# Patient Record
Sex: Female | Born: 1945
Health system: Southern US, Community
[De-identification: ages and names within clinical notes are randomized; demographics above are authoritative.]

## PROBLEM LIST (undated history)

## (undated) DIAGNOSIS — Z973 Presence of spectacles and contact lenses: Secondary | ICD-10-CM

## (undated) DIAGNOSIS — Z8619 Personal history of other infectious and parasitic diseases: Secondary | ICD-10-CM

## (undated) DIAGNOSIS — Z8541 Personal history of malignant neoplasm of cervix uteri: Secondary | ICD-10-CM

## (undated) DIAGNOSIS — Z972 Presence of dental prosthetic device (complete) (partial): Secondary | ICD-10-CM

## (undated) DIAGNOSIS — Z794 Long term (current) use of insulin: Secondary | ICD-10-CM

## (undated) DIAGNOSIS — E785 Hyperlipidemia, unspecified: Secondary | ICD-10-CM

## (undated) DIAGNOSIS — B370 Candidal stomatitis: Secondary | ICD-10-CM

## (undated) DIAGNOSIS — M1990 Unspecified osteoarthritis, unspecified site: Secondary | ICD-10-CM

## (undated) DIAGNOSIS — I739 Peripheral vascular disease, unspecified: Secondary | ICD-10-CM

## (undated) DIAGNOSIS — R3915 Urgency of urination: Secondary | ICD-10-CM

## (undated) DIAGNOSIS — R600 Localized edema: Secondary | ICD-10-CM

## (undated) DIAGNOSIS — I6381 Other cerebral infarction due to occlusion or stenosis of small artery: Secondary | ICD-10-CM

## (undated) DIAGNOSIS — R109 Unspecified abdominal pain: Secondary | ICD-10-CM

## (undated) DIAGNOSIS — J189 Pneumonia, unspecified organism: Secondary | ICD-10-CM

## (undated) DIAGNOSIS — K08109 Complete loss of teeth, unspecified cause, unspecified class: Secondary | ICD-10-CM

## (undated) DIAGNOSIS — C801 Malignant (primary) neoplasm, unspecified: Secondary | ICD-10-CM

## (undated) DIAGNOSIS — Z87442 Personal history of urinary calculi: Secondary | ICD-10-CM

## (undated) DIAGNOSIS — I6529 Occlusion and stenosis of unspecified carotid artery: Secondary | ICD-10-CM

## (undated) DIAGNOSIS — Z8709 Personal history of other diseases of the respiratory system: Secondary | ICD-10-CM

## (undated) DIAGNOSIS — E890 Postprocedural hypothyroidism: Secondary | ICD-10-CM

## (undated) DIAGNOSIS — R110 Nausea: Secondary | ICD-10-CM

## (undated) DIAGNOSIS — Z8639 Personal history of other endocrine, nutritional and metabolic disease: Secondary | ICD-10-CM

## (undated) DIAGNOSIS — E119 Type 2 diabetes mellitus without complications: Secondary | ICD-10-CM

## (undated) DIAGNOSIS — C569 Malignant neoplasm of unspecified ovary: Secondary | ICD-10-CM

## (undated) DIAGNOSIS — I639 Cerebral infarction, unspecified: Secondary | ICD-10-CM

## (undated) HISTORY — PX: COLONOSCOPY: SHX174

## (undated) HISTORY — PX: TOTAL HIP ARTHROPLASTY: SHX124

## (undated) HISTORY — PX: REVISION TOTAL HIP ARTHROPLASTY: SHX766

## (undated) HISTORY — DX: Peripheral vascular disease, unspecified: I73.9

## (undated) HISTORY — DX: Pneumonia, unspecified organism: J18.9

## (undated) HISTORY — PX: UPPER GASTROINTESTINAL ENDOSCOPY: SHX188

## (undated) HISTORY — PX: CATARACT EXTRACTION W/ INTRAOCULAR LENS  IMPLANT, BILATERAL: SHX1307

## (undated) HISTORY — DX: Occlusion and stenosis of unspecified carotid artery: I65.29

## (undated) HISTORY — PX: OTHER SURGICAL HISTORY: SHX169

## (undated) HISTORY — DX: Malignant neoplasm of unspecified ovary: C56.9

## (undated) HISTORY — PX: APPENDECTOMY: SHX54

## (undated) HISTORY — PX: TANDEM RING INSERTION: SHX6199

---
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
## 1998-03-01 ENCOUNTER — Ambulatory Visit (HOSPITAL_COMMUNITY): Admission: RE | Admit: 1998-03-01 | Discharge: 1998-03-01 | Payer: Self-pay | Admitting: Specialist

## 1999-06-24 ENCOUNTER — Other Ambulatory Visit: Admission: RE | Admit: 1999-06-24 | Discharge: 1999-06-24 | Payer: Self-pay | Admitting: *Deleted

## 2000-08-11 ENCOUNTER — Encounter: Payer: Self-pay | Admitting: Internal Medicine

## 2000-08-11 ENCOUNTER — Encounter: Admission: RE | Admit: 2000-08-11 | Discharge: 2000-08-11 | Payer: Self-pay | Admitting: Internal Medicine

## 2000-08-13 ENCOUNTER — Other Ambulatory Visit: Admission: RE | Admit: 2000-08-13 | Discharge: 2000-08-13 | Payer: Self-pay | Admitting: *Deleted

## 2000-08-20 ENCOUNTER — Encounter: Payer: Self-pay | Admitting: Internal Medicine

## 2000-08-20 ENCOUNTER — Encounter: Admission: RE | Admit: 2000-08-20 | Discharge: 2000-08-20 | Payer: Self-pay | Admitting: Internal Medicine

## 2000-11-12 ENCOUNTER — Encounter: Payer: Self-pay | Admitting: Internal Medicine

## 2000-11-12 ENCOUNTER — Encounter: Admission: RE | Admit: 2000-11-12 | Discharge: 2000-11-12 | Payer: Self-pay | Admitting: Internal Medicine

## 2001-12-07 ENCOUNTER — Other Ambulatory Visit: Admission: RE | Admit: 2001-12-07 | Discharge: 2001-12-07 | Payer: Self-pay | Admitting: *Deleted

## 2003-09-25 ENCOUNTER — Other Ambulatory Visit: Admission: RE | Admit: 2003-09-25 | Discharge: 2003-09-25 | Payer: Self-pay | Admitting: *Deleted

## 2004-04-22 ENCOUNTER — Emergency Department (HOSPITAL_COMMUNITY): Admission: EM | Admit: 2004-04-22 | Discharge: 2004-04-22 | Payer: Self-pay | Admitting: *Deleted

## 2004-09-18 ENCOUNTER — Other Ambulatory Visit: Admission: RE | Admit: 2004-09-18 | Discharge: 2004-09-18 | Payer: Self-pay | Admitting: *Deleted

## 2005-05-30 ENCOUNTER — Encounter: Payer: Self-pay | Admitting: Pulmonary Disease

## 2006-01-07 ENCOUNTER — Other Ambulatory Visit: Admission: RE | Admit: 2006-01-07 | Discharge: 2006-01-07 | Payer: Self-pay | Admitting: *Deleted

## 2007-02-15 ENCOUNTER — Encounter: Admission: RE | Admit: 2007-02-15 | Discharge: 2007-02-15 | Payer: Self-pay | Admitting: Unknown Physician Specialty

## 2007-03-19 ENCOUNTER — Encounter: Payer: Self-pay | Admitting: Pulmonary Disease

## 2007-09-20 ENCOUNTER — Ambulatory Visit (HOSPITAL_BASED_OUTPATIENT_CLINIC_OR_DEPARTMENT_OTHER): Admission: RE | Admit: 2007-09-20 | Discharge: 2007-09-20 | Payer: Self-pay | Admitting: Physician Assistant

## 2007-09-20 ENCOUNTER — Encounter (INDEPENDENT_AMBULATORY_CARE_PROVIDER_SITE_OTHER): Payer: Self-pay | Admitting: General Surgery

## 2008-04-26 ENCOUNTER — Encounter: Admission: RE | Admit: 2008-04-26 | Discharge: 2008-04-26 | Payer: Self-pay | Admitting: Gynecology

## 2008-05-03 ENCOUNTER — Encounter: Admission: RE | Admit: 2008-05-03 | Discharge: 2008-05-03 | Payer: Self-pay | Admitting: Gynecology

## 2008-05-09 ENCOUNTER — Other Ambulatory Visit: Admission: RE | Admit: 2008-05-09 | Discharge: 2008-05-09 | Payer: Self-pay | Admitting: Gynecology

## 2008-10-20 ENCOUNTER — Ambulatory Visit: Payer: Self-pay | Admitting: Pulmonary Disease

## 2008-10-20 DIAGNOSIS — E1169 Type 2 diabetes mellitus with other specified complication: Secondary | ICD-10-CM | POA: Insufficient documentation

## 2008-10-20 DIAGNOSIS — J438 Other emphysema: Secondary | ICD-10-CM | POA: Insufficient documentation

## 2008-10-20 DIAGNOSIS — J4489 Other specified chronic obstructive pulmonary disease: Secondary | ICD-10-CM | POA: Insufficient documentation

## 2008-10-20 DIAGNOSIS — J449 Chronic obstructive pulmonary disease, unspecified: Secondary | ICD-10-CM | POA: Insufficient documentation

## 2008-10-20 DIAGNOSIS — K5090 Crohn's disease, unspecified, without complications: Secondary | ICD-10-CM | POA: Insufficient documentation

## 2008-10-20 DIAGNOSIS — E785 Hyperlipidemia, unspecified: Secondary | ICD-10-CM

## 2008-10-20 DIAGNOSIS — E119 Type 2 diabetes mellitus without complications: Secondary | ICD-10-CM | POA: Insufficient documentation

## 2008-10-20 DIAGNOSIS — J209 Acute bronchitis, unspecified: Secondary | ICD-10-CM | POA: Insufficient documentation

## 2008-10-20 DIAGNOSIS — Z87891 Personal history of nicotine dependence: Secondary | ICD-10-CM | POA: Insufficient documentation

## 2008-10-27 ENCOUNTER — Telehealth (INDEPENDENT_AMBULATORY_CARE_PROVIDER_SITE_OTHER): Payer: Self-pay | Admitting: *Deleted

## 2008-11-03 ENCOUNTER — Ambulatory Visit: Payer: Self-pay | Admitting: Pulmonary Disease

## 2008-11-29 ENCOUNTER — Ambulatory Visit: Payer: Self-pay | Admitting: Pulmonary Disease

## 2008-11-29 DIAGNOSIS — B370 Candidal stomatitis: Secondary | ICD-10-CM | POA: Insufficient documentation

## 2009-04-27 ENCOUNTER — Encounter: Admission: RE | Admit: 2009-04-27 | Discharge: 2009-04-27 | Payer: Self-pay | Admitting: Emergency Medicine

## 2010-01-27 ENCOUNTER — Encounter: Payer: Self-pay | Admitting: Endocrinology

## 2010-02-02 ENCOUNTER — Encounter: Payer: Self-pay | Admitting: Endocrinology

## 2010-02-07 ENCOUNTER — Ambulatory Visit (HOSPITAL_COMMUNITY): Admission: RE | Admit: 2010-02-07 | Discharge: 2010-02-07 | Payer: Self-pay | Admitting: Emergency Medicine

## 2010-02-12 ENCOUNTER — Ambulatory Visit: Payer: Self-pay | Admitting: Endocrinology

## 2010-02-12 LAB — CONVERTED CEMR LAB
Free T4: 1.5 ng/dL (ref 0.6–1.6)
TSH: 0.02 microintl units/mL — ABNORMAL LOW (ref 0.35–5.50)

## 2010-02-21 ENCOUNTER — Ambulatory Visit (HOSPITAL_COMMUNITY): Admission: RE | Admit: 2010-02-21 | Discharge: 2010-02-21 | Payer: Self-pay | Admitting: Endocrinology

## 2010-04-11 ENCOUNTER — Ambulatory Visit: Payer: Self-pay | Admitting: Endocrinology

## 2010-04-11 LAB — CONVERTED CEMR LAB
Free T4: 0.7 ng/dL (ref 0.6–1.6)
TSH: 0.06 microintl units/mL — ABNORMAL LOW (ref 0.35–5.50)

## 2010-05-21 ENCOUNTER — Telehealth: Payer: Self-pay | Admitting: Endocrinology

## 2010-06-06 ENCOUNTER — Ambulatory Visit: Payer: Self-pay | Admitting: Endocrinology

## 2010-06-06 DIAGNOSIS — E039 Hypothyroidism, unspecified: Secondary | ICD-10-CM | POA: Insufficient documentation

## 2010-06-06 DIAGNOSIS — E890 Postprocedural hypothyroidism: Secondary | ICD-10-CM

## 2010-06-06 LAB — CONVERTED CEMR LAB
Free T4: 0.11 ng/dL — ABNORMAL LOW (ref 0.60–1.60)
TSH: 46.83 microintl units/mL — ABNORMAL HIGH (ref 0.35–5.50)

## 2010-07-05 ENCOUNTER — Telehealth: Payer: Self-pay | Admitting: Internal Medicine

## 2010-07-06 ENCOUNTER — Emergency Department (HOSPITAL_COMMUNITY): Admission: EM | Admit: 2010-07-06 | Discharge: 2010-07-06 | Payer: Self-pay | Admitting: Emergency Medicine

## 2010-07-18 ENCOUNTER — Ambulatory Visit: Payer: Self-pay | Admitting: Endocrinology

## 2010-07-18 LAB — CONVERTED CEMR LAB: TSH: 7.09 microintl units/mL — ABNORMAL HIGH (ref 0.35–5.50)

## 2010-09-19 ENCOUNTER — Ambulatory Visit: Payer: Self-pay | Admitting: Endocrinology

## 2010-09-19 DIAGNOSIS — R252 Cramp and spasm: Secondary | ICD-10-CM | POA: Insufficient documentation

## 2010-09-19 DIAGNOSIS — R209 Unspecified disturbances of skin sensation: Secondary | ICD-10-CM | POA: Insufficient documentation

## 2010-09-19 LAB — CONVERTED CEMR LAB
BUN: 16 mg/dL (ref 6–23)
CO2: 26 meq/L (ref 19–32)
Calcium: 9.6 mg/dL (ref 8.4–10.5)
Chloride: 106 meq/L (ref 96–112)
Creatinine, Ser: 0.6 mg/dL (ref 0.4–1.2)
GFR calc non Af Amer: 99.11 mL/min (ref >60)
Glucose, Bld: 128 mg/dL — ABNORMAL HIGH (ref 70–99)
Potassium: 4.4 meq/L (ref 3.5–5.1)
Sodium: 141 meq/L (ref 135–145)
TSH: 2.53 microintl units/mL (ref 0.35–5.50)
Vitamin B-12: 332 pg/mL (ref 211–911)

## 2010-09-20 LAB — CONVERTED CEMR LAB
Calcium, Total (PTH): 9.6 mg/dL (ref 8.4–10.5)
PTH: 22 pg/mL (ref 14.0–72.0)

## 2010-12-19 NOTE — Assessment & Plan Note (Signed)
Summary: 1 month/apc   Visit Type:  Follow-up Referred by:  Casper Harrison PCP:  Margaretmary Bayley PA  Chief Complaint:  fu visit.....[name] Kitchenmouth breaking out....stopped Advair....reviewed meds.  History of Present Illness: 65 year old smoker, with known history of diabetes,  Crohn's disease and a history of ovarian  cancer 12/09 episode of asthmatic bronchitis she has 2-3 such episodes every year. She had an episode in 2/09 and then again on 11 1309.   chest x-ray did not show any infiltrates or effusions.  She describes cough incontinence and had an episode in the office.  she was diagnosed with diabetes and placed on metformin once a day--tx prednisone taper and antibiotic.   1/10 Smoking has cut down to 2-4 cigs/day. Breathing much better. Advair caused thrush, now resolved with nystatin. Antibiotics cause vaginal discharge.     Updated Prior Medication List: SKELAXIN 800 MG TABS (METAXALONE) three times a day METFORMIN HCL 500 MG TABS (METFORMIN HCL) once daily METHOCARBAMOL 500 MG TABS (METHOCARBAMOL) once daily TRICOR 145 MG TABS (FENOFIBRATE) once daily ALPRAZOLAM 1 MG TABS (ALPRAZOLAM) once daily ADVAIR DISKUS 250-50 MCG/DOSE MISC (FLUTICASONE-SALMETEROL) 1 puff two times a day , rinse/gargle well after use.  Current Allergies: ! CODEINE  Past Medical History:    Reviewed history from 10/20/2008 and no changes required:       Crohn's Disease       C O P D       Emphysema       Hyperlipidemia       ovarian CA s/p RT at [facility]       Diabetes, Type 2   Social History:    Reviewed history from 10/20/2008 and no changes required:       married and lives with husband       has children       housewife   Risk Factors: Tobacco use:  current    Year started:  15 yrs    Cigarettes:  Yes -- 5 cigs pack(s) per day Alcohol use:  no   Review of Systems       The patient complains of dyspnea on exertion.  The patient denies anorexia, fever, weight loss, weight gain,  vision loss, decreased hearing, hoarseness, chest pain, syncope, peripheral edema, prolonged cough, headaches, hemoptysis, abdominal pain, melena, hematochezia, severe indigestion/heartburn, hematuria, incontinence, genital sores, muscle weakness, suspicious skin lesions, transient blindness, difficulty walking, depression, unusual weight change, abnormal bleeding, enlarged lymph nodes, angioedema, breast masses, and testicular masses.     Vital Signs:  Patient Profile:   65 Years Old Female Height:     60 inches Weight:      181.13 pounds BMI:     35.50 O2 Sat:      95 % O2 treatment:    Room Air Temp:     98.2 degrees F oral Pulse rate:   84 / minute BP sitting:   120 / 78  (left arm) Cuff size:   regular  Pt. in pain?   no  Vitals Entered By: Marinus Maw (November 29, 2008 2:16 PM)              Comments Medications reviewed with patient Marinus Maw  November 29, 2008 2:16 PM      Physical Exam  General:     well developed, well nourished, in no acute distress Head:     normocephalic and atraumatic Eyes:     PERRLA/EOM intact; conjunctiva and sclera clear Ears:     TMs  intact and clear with normal canals Nose:     no deformity, discharge, inflammation, or lesions Mouth:     no deformity or lesions Neck:     no masses, thyromegaly, or abnormal cervical nodes Chest Wall:     no deformities noted Lungs:     clear bilaterally to auscultation and percussion Heart:     regular rate and rhythm, S1, S2 without murmurs, rubs, gallops, or clicks Abdomen:     bowel sounds positive; abdomen soft and non-tender without masses, or organomegaly Msk:     no deformity or scoliosis noted with normal posture Pulses:     pulses normal Extremities:     no clubbing, cyanosis, edema, or deformity noted Neurologic:     CN II-XII grossly intact with normal reflexes, coordination, muscle strength and tone Skin:     intact without lesions or rashes Cervical  Nodes:     no significant adenopathy Axillary Nodes:     no significant adenopathy Psych:     alert and cooperative; normal mood and affect; normal attention span and concentration      Impression & Recommendations:  Problem # 1:  C O P D (ICD-496) Recent exacerbation -r esolved Estimate PFTs in future & consider spiriva Her updated medication list for this problem includes:    Advair Diskus 250-50 Mcg/dose Misc (Fluticasone-salmeterol) .[name]... 1 puff two times a day , rinse/gargle well after use.    Azithromycin 250 Mg Tabs (Azithromycin) ..... Once daily x 5 days  Orders: Est. Patient Level III (04540) Pulmonary Referral (Pulmonary)   Problem # 2:  TOBACCO ABUSE-HISTORY OF (ICD-V15.82) Emphasized cessation Orders: Est. Patient Level III (98119) Pulmonary Referral (Pulmonary)   Problem # 3:  THRUSH (ICD-112.0) Assessment: Improved resolved with nystatin. Given cream for vaginal discharge , candidiasis  Medications Added to Medication List This Visit: 1)  Azithromycin 250 Mg Tabs (Azithromycin) .... Once daily x 5 days 2)  Nystatin 100000 Unit/gm Crea (Nystatin) .... Once daily   Patient Instructions: 1)  Please schedule a follow-up appointment in 3 months. 2)  You have been asked to make an appointment for a Pulmonary Function Test (Breathing Test) prior to or at the time of your next visit. Use medications as usual unless otherwise instructed.   Prescriptions: NYSTATIN 100000 UNIT/GM CREA (NYSTATIN) once daily  #1 x 1   Entered and Authorized by:   Comer Locket. Vassie Loll MD   Signed by:   Comer Locket Vassie Loll MD on 11/29/2008   Method used:   Electronically to        [id number]* (retail)       [address]       [place], [place]  [address]       Ph: [phone]       Fax: [phone]   RxID:   [id number] AZITHROMYCIN 250 MG TABS (AZITHROMYCIN) once daily x 5 days  #5 x 0   Entered and Authorized by:   Comer Locket Vassie Loll MD   Signed by:   Comer Locket Vassie Loll  MD on 11/29/2008   Method used:   Electronically to        [facility] [phone]* (retail)       [address]       [place], [place]  [address]       Ph: [phone]       Fax: [phone]   RxID:   [id number]

## 2010-12-19 NOTE — Assessment & Plan Note (Signed)
Summary: 6 WK ROV/NWS   #   Vital Signs:  Patient profile:   65 year old female Height:      60 inches (152.40 cm) Weight:      175.50 pounds (79.77 kg) BMI:     34.40 O2 Sat:      95 % on Room air Temp:     97.1 degrees F (36.17 degrees C) oral Pulse rate:   60 / minute BP sitting:   132 / 82  (left arm) Cuff size:   regular  Vitals Entered By: Brenton Grills MA (June 06, 2010 2:45 PM)  O2 Flow:  Room air CC: 6 wk ROV/pt c/o cramping all over and high BS, meter is broken/aj Is Patient Diabetic? Yes   Referring Provider:   [facility] Primary Provider:  Margaretmary Bayley PA  CC:  6 wk ROV/pt c/o cramping all over and high BS and meter is broken/aj.  History of Present Illness: the status of at least 3 ongoing medical problems is addressed today: hyperthyroidism:  pt is now approx 3 1/2 months s/p i-131 rx for hyperthyroidism due to grave's dz.  she has weight gain muscle cramps:  they persist. dm:  no cbg record, but states cbg's are 200's.  no hypoglycemic sxs.  Current Medications (verified): 1)  Metformin Hcl 500 Mg Tabs (Metformin Hcl) .... Once Daily 2)  Methocarbamol 500 Mg Tabs (Methocarbamol) .Marland Kitchen.. 1 Tab Every 6 Hrs As Needed For Cramps 3)  Alprazolam 1 Mg Tabs (Alprazolam) .... Once Daily 4)  Advair Diskus 250-50 Mcg/dose Misc (Fluticasone-Salmeterol) .Marland Kitchen.. 1 Puff Two Times A Day , Rinse/gargle Well After Use. 5)  Fosamax 10 Mg Tabs (Alendronate Sodium) .Marland Kitchen.. 1tab Once Daily 6)  Vitamin D 50,000 Units 7)  Lopid 600 Mg Tabs (Gemfibrozil) .Marland Kitchen.. 1 Two Times A Day  Allergies (verified): 1)  ! Codeine  Past History:  Past Medical History: Last updated: 10/20/2008 Crohn's Disease C O P D Emphysema Hyperlipidemia ovarian CA s/p RT at [facility] Diabetes, Type 2  Review of Systems       she has fatigue.  no n/v  Physical Exam  General:  normal appearance.   Neck:  Supple without thyroid enlargement or tenderness. .  Msk:  gait is normal and  steady  Additional Exam:  FastTSH              [H]  46.83 uIU/mL                0.35-5.50 Free T4              [L]  0.11 ng/dL      Impression & Recommendations:  Problem # 1:  HYPOTHYROIDISM, POST-RADIATION (ICD-244.1) Assessment New  Problem # 2:  DIABETES, TYPE 2 (ICD-250.00) needs increased rx  Problem # 3:  muscle cramps ? thyroid-related  Medications Added to Medication List This Visit: 1)  Metformin Hcl 500 Mg Xr24h-tab (Metformin hcl) .... 2 tabs two times a day 2)  Levothyroxine Sodium 100 Mcg Tabs (Levothyroxine sodium) .Marland Kitchen.. 1 once daily  Other Orders: TLB-TSH (Thyroid Stimulating Hormone) (84443-TSH) TLB-T4 (Thyrox), Free [phone]) Est. Patient Level IV (78295)  Patient Instructions: 1)  blood tests are being ordered for you today.  please call [phone] to hear your test results. 2)  Please schedule a follow-up appointment in 6 weeks. 3)  here is a new blood sugar meter. 4)  increase metformin to 2x500 mg two times a day.  please see your pcp, kim,  to recheck your diabetes.   Prescriptions: LEVOTHYROXINE SODIUM 100 MCG TABS (LEVOTHYROXINE SODIUM) 1 once daily  #30 x 11   Entered and Authorized by:   Minus Breeding MD   Signed by:   Minus Breeding MD on 06/06/2010   Method used:   Electronically to        [facility] [phone]* (retail)       [address]       [place], [place]  [address]       Ph: [phone]       Fax: [phone]   RxID:   [id number] METFORMIN HCL 500 MG XR24H-TAB (METFORMIN HCL) 2 tabs two times a day  #120 x 1   Entered and Authorized by:   Minus Breeding MD   Signed by:   Minus Breeding MD on 06/06/2010   Method used:   Electronically to        [facility] [phone]* (retail)       [address]       [place], [place]  [address]       Ph: [phone]       Fax: [phone]   RxID:   [id number]

## 2010-12-19 NOTE — Assessment & Plan Note (Signed)
Summary: 2 weeks/apc   Referred by:  Eual Fines PCP:  Berenda Morale PA  Chief Complaint:  2 week follow up - pt states she has no complaints.  History of Present Illness: 65 year old smoker, with known history of diabetes,  Crohn's disease and a history of ovarian  cancer presents for a revision of persistent cough and wheezing -initial pulmonary consult 10/20/08  10/20/08--she has 2-3 episodes of asthmatic bronchitis every year. She had an episode in 2/09 and then again on 11 1309. She was given Zithromax albuterol inhaler Hycodan syrup. And referred to neurology for muscle spasms. chest x-ray did not show any infiltrates or effusions. She felt well by Thanksgiving but then worsened again, sinus films were performed, and she was given Augmentin and Maxifed-G tabs b.i.d.Marland Kitchen Last cigarette was 4 days ago. She describes cough incontinence and had an episode in the office. She now has clear phlegm and intermittent wheezing. She had several coughing fits in the office. she was recently diagnosed with diabetes and placed on metformin once a day--tx prednisone taper and antibiotic.   November 03, 2008--returns for follow up. Feeling better with much improved cough, congestion and wheezing. Now finish antibiotics. Has few days left on prednisone. Advair caused thrush, now resolved with nystatin. Breathing is better. Discussed smoking cesstation- "not ready yet"Denies chest pain,  orthopnea, hemoptysis, fever, n/v/d.      Prior Medications Reviewed Using: Patient Recall  Updated Prior Medication List: SKELAXIN 800 MG TABS (METAXALONE) three times a day HYDROCODONE-HOMATROPINE 5-1.5 MG/5ML SYRP (HYDROCODONE-HOMATROPINE) 1 tsp every 6 hours for severe cough METFORMIN HCL 500 MG TABS (METFORMIN HCL) once daily METHOCARBAMOL 500 MG TABS (METHOCARBAMOL) once daily CYCLOBENZAPRINE HCL 5 MG TABS (CYCLOBENZAPRINE HCL) two times a day TRICOR 145 MG TABS (FENOFIBRATE) once daily ALPRAZOLAM 1 MG TABS  (ALPRAZOLAM) once daily PREDNISONE 10 MG TABS (PREDNISONE) 2 tabs once daily x 1 wk, then 1 tab once daily x 1 wk, then 1/2 tab once daily x 1 wk NYSTATIN 100000 UNIT/ML SUSP (NYSTATIN) 5 ml swish & swallow two times a day  Current Allergies (reviewed today): ! CODEINE  Past Medical History:    Reviewed history from 10/20/2008 and no changes required:       Crohn's Disease       C O P D       Emphysema       Hyperlipidemia       ovarian CA s/p RT at [facility]       Diabetes, Type 2   Family History:    Reviewed history from 10/20/2008 and no changes required:       emphysema--Father--sister---brother..All deceased       cancer--ovarian---self       Mother- skin cancer       sisters- cervix  Social History:    Reviewed history from 10/20/2008 and no changes required:       married and lives with husband       has children       housewife   Risk Factors: Tobacco use:  current    Year started:  15 yrs    Cigarettes:  Yes -- 5 cigs pack(s) per day Alcohol use:  no   Review of Systems      See HPI   Vital Signs:  Patient Profile:   65 Years Old Female Height:     60 inches Weight:      176.31 pounds O2 Sat:      98 % O2 treatment:  Room Air Temp:     97.1 degrees F oral Pulse rate:   68 / minute BP sitting:   122 / 74  (left arm) Cuff size:   regular  Vitals Entered By: Parke Poisson CNA (November 03, 2008 9:57 AM)             Is Patient Diabetic? Yes Comments Medications reviewed with patient Lakeyta Vandenheuvel CNA  November 03, 2008 9:57 AM      Physical Exam  GEN: A/Ox3; pleasant , NAD HEENT:  [place]/AT, , EACs-clear, TMs-wnl, NOSE-clear, THROAT-clear NECK:  Supple w/ fair ROM; no JVD; normal carotid impulses w/o bruits; no thyromegaly or nodules palpated; no lymphadenopathy. CHEST:  Coarse BS no wheezing.  HEART:  RRR, no m/r/g   ABDOMEN:  Soft & nt; nml bowel sounds; no organomegaly or masses detected. EXT: Warm bil,  no calf pain, edema, clubbing, pulses  intact        Problem # 1:  C O P D (ICD-496) Exacerbation- resolving.  REC: STOP SMOKING Continue on Advair 250/29m two times a day, rinse/gargle well after use.  Taper off of prednisone.  Mucinex DM two times a day as needed cough/congesiton.  follow up 1 month Dr. AElsworth Soho Please contact office for sooner follow up if symptoms do not improve or worsen  The following medications were removed from the medication list:    Augmentin 875-125 Mg Tabs (Amoxicillin-pot clavulanate) ..Marland Kitchen..Marland KitchenTwo times a day  Her updated medication list for this problem includes:    Prednisone 10 Mg Tabs (Prednisone) ..Marland Kitchen.. 2 tabs once daily x 1 wk, then 1 tab once daily x 1 wk, then 1/2 tab once daily x 1 wk    Advair Diskus 250-50 Mcg/dose Misc (Fluticasone-salmeterol) ..Marland Kitchen.. 1 puff two times a day , rinse/gargle well after use.  Orders: Est. Patient Level II ((11914   Medications Added to Medication List This Visit: 1)  Advair Diskus 250-50 Mcg/dose Misc (Fluticasone-salmeterol) ..Marland Kitchen. 1 puff two times a day , rinse/gargle well after use.  Complete Medication List: 1)  Skelaxin 800 Mg Tabs (Metaxalone) .... Three times a day 2)  Hydrocodone-homatropine 5-1.5 Mg/561mSyrp (Hydrocodone-homatropine) ...Marland Kitchen 1 tsp every 6 hours for severe cough 3)  Metformin Hcl 500 Mg Tabs (Metformin hcl) .... Once daily 4)  Methocarbamol 500 Mg Tabs (Methocarbamol) .... Once daily 5)  Cyclobenzaprine Hcl 5 Mg Tabs (Cyclobenzaprine hcl) .... Two times a day 6)  Tricor 145 Mg Tabs (Fenofibrate) .... Once daily 7)  Alprazolam 1 Mg Tabs (Alprazolam) .... Once daily 8)  Prednisone 10 Mg Tabs (Prednisone) .... 2 tabs once daily x 1 wk, then 1 tab once daily x 1 wk, then 1/2 tab once daily x 1 wk 9)  Nystatin 100000 Unit/ml Susp (Nystatin) .... 5 ml swish & swallow two times a day 10)  Advair Diskus 250-50 Mcg/dose Misc (Fluticasone-salmeterol) ...Marland Kitchen 1 puff two times a day , rinse/gargle well after use.   Patient Instructions: 1)   STOP SMOKING 2)  Continue on Advair 250/5060mwo times a day, rinse/gargle well after use.  3)  Taper off of prednisone.  4)  Mucinex DM two times a day as needed cough/congesiton.  5)  follow up 1 month Dr. AlvElsworth Soho)  Please contact office for sooner follow up if symptoms do not improve or worsen    Prescriptions: ADVAIR DISKUS 250-50 MCG/DOSE MISC (FLUTICASONE-SALMETEROL) 1 puff two times a day , rinse/gargle well after use.  #1 x 5   Entered  and Authorized by:   Rexene Edison NP   Signed by:   Rexene Edison NP on 11/03/2008   Method used:   Electronically to        [facility] [phone]* (retail)       [address]       [place], [place]  [address]       Ph: [phone]       Fax: [phone]   RxID:   [id number]  ]

## 2010-12-19 NOTE — Assessment & Plan Note (Signed)
Summary: CHRONIC ASTHMATIC BRONCHITIS/CB   Visit Type:  Initial Consult Referred by:  Eual Fines PCP:  Berenda Morale PA  Chief Complaint:  pulmonary consult.......Marland Kitchenrepeat head and chest congestion....reviewed meds.  History of Present Illness: 65 year old smoker, diabetes Crohn's disease and a history of ovarian  cancer presents for a revision of persistent cough and wheezing. she has 2-3 episodes of asthmatic bronchitis every year. She had an episode in 2/09 and then again on 11 1309. She was given Zithromax albuterol inhaler Hycodan syrup. And referred to neurology for muscle spasms. chest x-ray did not show any infiltrates or effusions. She felt well by Thanksgiving but then worsened again, sinus films were performed, and she was given Augmentin and Maxifed-G tabs b.i.d.Marland Kitchen Last cigarette was 4 days ago. She describes cough incontinence and had an episode in the office. She now has clear phlegm and intermittent wheezing. She had several coughing fits in the office. she was recently diagnosed with diabetes and placed on metformin once a day     Current Allergies: ! CODEINE  Past Medical History:    Crohn's Disease    C O P D    Emphysema    Hyperlipidemia    ovarian CA s/p RT at [facility]    Diabetes, Type 2  Past Surgical History:    appendix removed   Family History:    emphysema--Father--sister---brother..All deceased    cancer--ovarian---self    Mother- skin cancer    sisters- cervix  Social History:    married and lives with husband    has children    housewife   Risk Factors:  Tobacco use:  current    Year started:  15 yrs    Cigarettes:  Yes -- 5 cigs pack(s) per day Alcohol use:  no   Review of Systems       The patient complains of dyspnea on exertion and prolonged cough.  The patient denies anorexia, fever, weight loss, weight gain, vision loss, decreased hearing, hoarseness, chest pain, syncope, peripheral edema, headaches, hemoptysis, abdominal  pain, melena, hematochezia, severe indigestion/heartburn, hematuria, incontinence, genital sores, muscle weakness, suspicious skin lesions, transient blindness, difficulty walking, depression, unusual weight change, abnormal bleeding, enlarged lymph nodes, angioedema, breast masses, and testicular masses.     Vital Signs:  Patient Profile:   65 Years Old Female Height:     60 inches Weight:      179 pounds BMI:     35.08 O2 Sat:      95 % O2 treatment:    Room Air Temp:     97.6 degrees F oral Pulse rate:   75 / minute BP sitting:   110 / 70  (left arm) Cuff size:   regular  Pt. in pain?   no  Vitals Entered By: Marcy Siren Deborra Medina) (October 20, 2008 9:33 AM)                  Physical Exam  General:     obese.  coughing spells HEENT - no thrush, no post nasal drip No JVD, no lymphadenopathy CVS- s1s2 nml, no murmur RS-  no crackles, scattered diffuse rhonchi Abd- soft, non-tender, no organomegaly CNS- non focal Ext - no edema     Pulmonary Function Test Height (in.): 60 Gender: Female    Impression & Recommendations:  Problem # 1:  BRONCHITIS, ACUTE WITH BRONCHOSPASM (ICD-466.0) she will complete her course of Augmentin. I gave her a course of prednisone starting at 20 mg and tapering by  10 mg every week -on concern about this worsening her sugars. I gave her a sample of Advair 250/50 to take The following medications were removed from the medication list:    Ventolin Hfa 108 (90 Base) Mcg/act Aers (Albuterol sulfate) .Marland Kitchen... 2 puffs 6 times per day  Her updated medication list for this problem includes:    Hydrocodone-homatropine 5-1.5 Mg/34m Syrp (Hydrocodone-homatropine) ..Marland Kitchen.. 1 tsp every 6 hours for severe cough    Augmentin 875-125 Mg Tabs (Amoxicillin-pot clavulanate) ..Marland Kitchen..Marland KitchenTwo times a day  Orders: Consultation Level IV ((37169   Problem # 2:  C O P D (ICD-496) lung function with estimated in future visits and the need for long-acting  bronchodilators decided.  The following medications were removed from the medication list:    Ventolin Hfa 108 (90 Base) Mcg/act Aers (Albuterol sulfate) ..Marland Kitchen.. 2 puffs 6 times per day  Her updated medication list for this problem includes:    Augmentin 875-125 Mg Tabs (Amoxicillin-pot clavulanate) ..Marland Kitchen..Marland KitchenTwo times a day    Prednisone 10 Mg Tabs (Prednisone) ..Marland Kitchen.. 2 tabs once daily x 1 wk, then 1 tab once daily x 1 wk, then 1/2 tab once daily x 1 wk  Orders: Consultation Level IV ((67893   Problem # 3:  DIABETES, TYPE 2 (ICD-250.00) she will monitor her sugars while on prednisone and increase metformin to b.i.d. if sugars are more than 200 Her updated medication list for this problem includes:    Metformin Hcl 500 Mg Tabs (Metformin hcl) ..... Once daily  Orders: Consultation Level IV ((81017   Problem # 4:  TOBACCO ABUSE-HISTORY OF (ICD-V15.82) smoking cessation is paramount here and will be pursued on future visits Orders: Consultation Level IV ((51025   Medications Added to Medication List This Visit: 1)  Metformin Hcl 500 Mg Tabs (Metformin hcl) .... Once daily 2)  Methocarbamol 500 Mg Tabs (Methocarbamol) .... Once daily 3)  Cyclobenzaprine Hcl 5 Mg Tabs (Cyclobenzaprine hcl) .... Two times a day 4)  Tricor 145 Mg Tabs (Fenofibrate) .... Once daily 5)  Alprazolam 1 Mg Tabs (Alprazolam) .... Once daily 6)  Augmentin 875-125 Mg Tabs (Amoxicillin-pot clavulanate) .... Two times a day 7)  Prednisone 10 Mg Tabs (Prednisone) .... 2 tabs once daily x 1 wk, then 1 tab once daily x 1 wk, then 1/2 tab once daily x 1 wk   Patient Instructions: 1)  Please schedule a follow-up appointment in 1-2 weeks with TP 2)  Please schedule a follow-up appointment in 1 month with ALVA 3)  Take prednisone 2 tabs once daily x 1 wk, then 1 tab once daily x 1 wk, then 1/2 tab once daily x 1 wk 4)  Monitor your blood sugar daily, if more than 200 take metformin two times a day  5)  Complete your  course of antibiotic 6)  Take advair two times a day  7)  Use albuterol & cough syrupa s needed only   Prescriptions: PREDNISONE 10 MG TABS (PREDNISONE) 2 tabs once daily x 1 wk, then 1 tab once daily x 1 wk, then 1/2 tab once daily x 1 wk  #30 x 1   Entered and Authorized by:   RLeanna SatoAElsworth SohoMD   Signed by:   RLeanna SatoAElsworth SohoMD on 10/20/2008   Method used:   Print then Give to Patient   RxID:   [id number] ]  Appended Document: CHRONIC ASTHMATIC BRONCHITIS/CB fax to [facility], pl obatin CXR reports  Appended Document: CHRONIC ASTHMATIC BRONCHITIS/CB  It was done

## 2010-12-19 NOTE — Assessment & Plan Note (Signed)
Summary: 6 WEEK FU AFTER TAKING THE PILL/NWS   Vital Signs:  Patient profile:   65 year old female Height:      60 inches (152.40 cm) Weight:      168.50 pounds (76.59 kg) O2 Sat:      95 % on Room air Temp:     98.5 degrees F (36.94 degrees C) oral Pulse rate:   75 / minute BP sitting:   152 / 84  (left arm) Cuff size:   regular  Vitals Entered By: Josph Macho RMA (Apr 11, 2010 12:52 PM)  O2 Flow:  Room air CC: 6 week follow up after radiation pill/ CF Is Patient Diabetic? Yes   Referring Provider:   [facility] Primary Provider:  Margaretmary Bayley PA  CC:  6 week follow up after radiation pill/ CF.  History of Present Illness: pt is now approx 7 weeks s/p i-131 rx for hyperthyroidism.  she feels well, excpet for slight dry mouth.   pt states few years of moderate cramps of the legs, but no assoc numbness.  Current Medications (verified): 1)  Metformin Hcl 500 Mg Tabs (Metformin Hcl) .... Once Daily 2)  Methocarbamol 500 Mg Tabs (Methocarbamol) .... Once Daily 3)  Alprazolam 1 Mg Tabs (Alprazolam) .... Once Daily 4)  Advair Diskus 250-50 Mcg/dose Misc (Fluticasone-Salmeterol) .Marland Kitchen.. 1 Puff Two Times A Day , Rinse/gargle Well After Use. 5)  Fosamax 10 Mg Tabs (Alendronate Sodium) .Marland Kitchen.. 1tab Once Daily 6)  Vitamin D 50,000 Units 7)  Lopid 600 Mg Tabs (Gemfibrozil) .Marland Kitchen.. 1 Two Times A Day  Allergies (verified): 1)  ! Codeine  Past History:  Past Medical History: Last updated: 10/20/2008 Crohn's Disease C O P D Emphysema Hyperlipidemia ovarian CA s/p RT at [facility], Type 2  Review of Systems  The patient denies weight loss and weight gain.    Physical Exam  General:  normal appearance.   Neck:  uncertain if i can feel the top of a goiter Additional Exam:  FastTSH              [L]  0.06 uIU/mL                 0.35-5.50 Free T4                   0.7 ng/dL     Impression & Recommendations:  Problem # 1:  HYPERTHYROIDISM (ICD-242.90) not  better yet  Medications Added to Medication List This Visit: 1)  Methocarbamol 500 Mg Tabs (Methocarbamol) .Marland Kitchen.. 1 tab every 6 hrs as needed for cramps  Other Orders: TLB-TSH (Thyroid Stimulating Hormone) (84443-TSH) TLB-T4 (Thyrox), Free [phone]) Est. Patient Level III (40981)  Patient Instructions: 1)  blood tests are being ordered for you today.  please call [phone] to hear your test results. 2)  pending the test results, please continue the same medications for now, and 3)  Please schedule a follow-up appointment in 6 weeks. 4)  trial of methocarbamol as needed for cramps.  i sent a prscription to your pharmacy. 5)  (update: i left message on phone-tree:  rx as we discussed) Prescriptions: METHOCARBAMOL 500 MG TABS (METHOCARBAMOL) 1 tab every 6 hrs as needed for cramps  #30 x 0   Entered and Authorized by:   Minus Breeding MD   Signed by:   Minus Breeding MD on 04/11/2010   Method used:   Electronically to        [facility]  [address] [phone]* (retail)       [address]       [place], [place]  [address]       Ph: [phone]       Fax: [phone]   RxID:   [id number]

## 2010-12-19 NOTE — Letter (Signed)
Summary: [facility]   Imported By: Sherian Rein 02/18/2010 08:16:03  _____________________________________________________________________  External Attachment:    Type:   Image     Comment:   External Document

## 2010-12-19 NOTE — Assessment & Plan Note (Signed)
Summary: 6 WK FU---STC   Vital Signs:  Patient profile:   65 year old female Height:      60 inches (152.40 cm) Weight:      172 pounds (78.18 kg) BMI:     33.71 O2 Sat:      96 % on Room air Temp:     97.3 degrees F (36.28 degrees C) oral Pulse rate:   73 / minute BP sitting:   128 / 74  (left arm) Cuff size:   regular  Vitals Entered By: Rebeca Alert MA (July 18, 2010 1:57 PM)  O2 Flow:  Room air CC: 6 week F/U/pt is not currently taking Alprazolam/aj Is Patient Diabetic? Yes   Referring Gracieann Stannard:   [facility] urgent [facility] Primary Shrihaan Porzio:  Berenda Morale PA  CC:  6 week F/U/pt is not currently taking Alprazolam/aj.  History of Present Illness: hyperthyroidism:  pt is now approx 5 months s/p i-131 rx for hyperthyroidism due to grave's dz.  she takes synthroid as rx'ed.  she reports muscle cramps and anxiety.   Current Medications (verified): 1)  Methocarbamol 500 Mg Tabs (Methocarbamol) .Marland Kitchen.. 1 Tab Every 6 Hrs As Needed For Cramps 2)  Alprazolam 1 Mg Tabs (Alprazolam) .... Once Daily 3)  Advair Diskus 250-50 Mcg/dose Misc (Fluticasone-Salmeterol) .Marland Kitchen.. 1 Puff Two Times A Day , Rinse/gargle Well After Use. 4)  Fosamax 10 Mg Tabs (Alendronate Sodium) .Marland Kitchen.. 1tab Once Daily 5)  Vitamin D 50,000 Units 6)  Lopid 600 Mg Tabs (Gemfibrozil) .Marland Kitchen.. 1 Two Times A Day 7)  Metformin Hcl 500 Mg Xr24h-Tab (Metformin Hcl) .... 2 Tabs Two Times A Day 8)  Levothyroxine Sodium 100 Mcg Tabs (Levothyroxine Sodium) .Marland Kitchen.. 1 Once Daily 9)  Gabapentin 100 Mg Caps (Gabapentin) .Marland Kitchen.. 1 Two Times A Day 10)  Diazepam 5 Mg Tabs (Diazepam) .Marland Kitchen.. 1-2 Tablets Three Times A Day As Needed For Muscle Spasms and Cramps  Allergies (verified): 1)  ! Codeine  Past History:  Past Medical History: Last updated: 10/20/2008 Crohn's Disease C O P D Emphysema Hyperlipidemia ovarian CA s/p RT at [facility], Type 2  Review of Systems       The patient complains of headaches.    Physical  Exam  General:  normal appearance.   Neck:  i do not appreciate the thyroid Additional Exam:  FastTSH              [H]  7.09 uIU/mL     Impression & Recommendations:  Problem # 1:  HYPOTHYROIDISM, POST-RADIATION (ICD-244.1) needs increased rx  Medications Added to Medication List This Visit: 1)  Diazepam 5 Mg Tabs (Diazepam) .Marland Kitchen.. 1-2 tablets three times a day as needed for muscle spasms and cramps 2)  Levothyroxine Sodium 125 Mcg Tabs (Levothyroxine sodium) .Marland Kitchen.. 1 tab once daily  Other Orders: TLB-TSH (Thyroid Stimulating Hormone) (84443-TSH) Est. Patient Level III (51884)  Patient Instructions: 1)  blood tests are being ordered for you today.  please call [phone] to hear your test results. 2)  pending the test results, please continue the same medications for now 3)  Please schedule a follow-up appointment in 2 months. 4)  (update: i left message on phone-tree:  increase synthroid to 125 micrograms/day). Prescriptions: LEVOTHYROXINE SODIUM 125 MCG TABS (LEVOTHYROXINE SODIUM) 1 tab once daily  #30 x 4   Entered and Authorized by:   Donavan Foil MD   Signed by:   Donavan Foil MD on 07/18/2010   Method used:   Electronically  to        [facility] [phone]* (retail)       [address]       [place], [place]  [address]       Ph: [phone]       Fax: [phone]   RxID:   [id number]

## 2010-12-19 NOTE — Assessment & Plan Note (Signed)
Summary: 2 MTH FU---STC   Vital Signs:  Patient profile:   65 year old female Height:      60 inches (152.40 cm) Weight:      180.13 pounds (81.88 kg) BMI:     35.31 O2 Sat:      95 % on Room air Temp:     98.6 degrees F (37.00 degrees C) oral Pulse rate:   73 / minute BP sitting:   126 / 82  (left arm) Cuff size:   regular  Vitals Entered By: Brenton Grills CMA Duncan Dull) (September 19, 2010 1:07 PM)  O2 Flow:  Room air CC: 2 month F/U/pt c/o cramping in legs and fingers/aj Is Patient Diabetic? Yes   Referring Provider:   [facility] Primary Provider:  Margaretmary Bayley PA  CC:  2 month F/U/pt c/o cramping in legs and fingers/aj.  History of Present Illness: hyperthyroidism:  pt is now approx 7 months s/p i-131 rx for hyperthyroidism due to grave's dz.  she takes synthroid as rx'ed.   pt states she feels well in general, except few mos of severe muscle cramps of the legs, ribs, and hands.  she has asoc numbness.   Current Medications (verified): 1)  Methocarbamol 500 Mg Tabs (Methocarbamol) .Marland Kitchen.. 1 Tab Every 6 Hrs As Needed For Cramps 2)  Advair Diskus 250-50 Mcg/dose Misc (Fluticasone-Salmeterol) .Marland Kitchen.. 1 Puff Two Times A Day , Rinse/gargle Well After Use. 3)  Fosamax 10 Mg Tabs (Alendronate Sodium) .Marland Kitchen.. 1tab Once Daily 4)  Vitamin D 50,000 Units 5)  Lopid 600 Mg Tabs (Gemfibrozil) .Marland Kitchen.. 1 Two Times A Day 6)  Metformin Hcl 500 Mg Xr24h-Tab (Metformin Hcl) .... 2 Tabs Two Times A Day 7)  Gabapentin 100 Mg Caps (Gabapentin) .Marland Kitchen.. 1 Two Times A Day 8)  Diazepam 5 Mg Tabs (Diazepam) .Marland Kitchen.. 1-2 Tablets Three Times A Day As Needed For Muscle Spasms and Cramps 9)  Levothyroxine Sodium 125 Mcg Tabs (Levothyroxine Sodium) .Marland Kitchen.. 1 Tab Once Daily  Allergies (verified): 1)  ! Codeine  Past History:  Past Medical History: Last updated: 10/20/2008 Crohn's Disease C O P D Emphysema Hyperlipidemia ovarian CA s/p RT at [facility], Type 2  Review of Systems       The  patient complains of weight gain.  The patient denies syncope.    Physical Exam  General:  normal appearance.   Neck:  i do not appreciate the thyroid Psych:  Alert and cooperative; normal mood and affect; normal attention span and concentration.   Additional Exam:  FastTSH                   2.53 uIU/mL                 0.35-5.50    Sodium                    141 mEq/L                   135-145   Potassium                 4.4 mEq/L                   3.5-5.1   Chloride                  106 mEq/L  16-109   Carbon Dioxide            26 mEq/L                    19-32   Glucose              [H]  128 mg/dL                   60-45   BUN                       16 mg/dL                    4-09   Creatinine                0.6 mg/dL                   8.1-1.9   Calcium                   9.6 mg/dL                   1.4-78.2      Vitamin B12               332 pg/mL         Impression & Recommendations:  Problem # 1:  HYPOTHYROIDISM, POST-RADIATION (ICD-244.1) well-replaced  Problem # 2:  MUSCLE CRAMPS (ICD-729.82) uncertain etiology  Other Orders: T-Parathyroid Hormone, Intact w/ Calcium (95621-30865) TLB-TSH (Thyroid Stimulating Hormone) (84443-TSH) TLB-BMP (Basic Metabolic Panel-BMET) (80048-METABOL) TLB-B12, Serum-Total ONLY (78469-G29) Est. Patient Level IV (52841)  Patient Instructions: 1)  blood tests are being ordered for you today.  please call [phone] to hear your test results. 2)  pending the test results, please continue the same medications for now 3)  Please schedule a follow-up appointment in 4 months. 4)  (update: i left message on phone-tree:  no cause for the cramps is found.  rx as we discussed)   Orders Added: 1)  T-Parathyroid Hormone, Intact w/ Calcium [27253-66440] 2)  TLB-TSH (Thyroid Stimulating Hormone) [84443-TSH] 3)  TLB-BMP (Basic Metabolic Panel-BMET) [80048-METABOL] 4)  TLB-B12, Serum-Total ONLY [82607-B12] 5)  Est. Patient Level IV  [34742]

## 2010-12-19 NOTE — Progress Notes (Signed)
Summary: yeast infection in mouth- pt called again  Phone Note Call from Patient Call back at Home Phone [phone]   Caller: Patient Call For: alva Reason for Call: Talk to Nurse Summary of Call: pt has a yeast infection in her mouth,  Woyuld like to have some Nystatin to rinse mouth out with. [facility] [address] Initial call taken by: Zigmund Gottron,  October 27, 2008 9:25 AM  Follow-up for Phone Call        pt called back. wants magic mouthwash or any rinse for the yeast infection in her mouth asap please. pt is "miserable". Cooper Render  October 27, 2008 12:55 PM  Additional Follow-up for Phone Call Additional follow up Details #1::        nystatin OK - pl let her know - rinse mouth after each dose of advair Additional Follow-up by: Leanna Sato. Elsworth Soho MD,  October 27, 2008 12:55 PM    Additional Follow-up for Phone Call Additional follow up Details #2::    LMOM that rx for Nystatin was sent to [facility] on [address].  Also instructed to rinse mouth after each use of Advair. Follow-up by: Doroteo Glassman RN,  October 27, 2008 1:09 PM  New/Updated Medications: NYSTATIN 100000 UNIT/ML SUSP (NYSTATIN) 5 ml swish & swallow two times a day   Prescriptions: NYSTATIN 100000 UNIT/ML SUSP (NYSTATIN) 5 ml swish & swallow two times a day  #100 x 1   Entered and Authorized by:   Leanna Sato Elsworth Soho MD   Signed by:   Leanna Sato Elsworth Soho MD on 10/27/2008   Method used:   Electronically to        [facility] [phone]* (retail)       [address]       [place], [place]  [address]       Ph: [phone]       Fax: [phone]   RxID:   [id number]

## 2010-12-19 NOTE — Assessment & Plan Note (Signed)
Summary: NEW HYPERTHY ENDO-MEDICARE-PT--PKG/OFF-STELLA/DR BOAZE--STC   Vital Signs:  Patient profile:   65 year old female Height:      60 inches (152.40 cm) Weight:      168.50 pounds (76.59 kg) BMI:     33.03 O2 Sat:      99 % on Room air Temp:     98.0 degrees F (36.67 degrees C) oral Pulse rate:   113 / minute BP sitting:   124 / 82  (left arm) Cuff size:   regular  Vitals Entered By: Josph Macho RMA (February 12, 2010 1:14 PM) Sherese Christopher,SMA  O2 Flow:  Room air CC: New Endo: Hyperthyroid./ pt states she no longer takes Skelaxin or Tricor//DISH   Referring Provider:   [facility] urgent [facility] Primary Provider:  Margaretmary Bayley PA  CC:  New Endo: Hyperthyroid./ pt states she no longer takes Skelaxin or Tricor//.  History of Present Illness: pt states approx 6 mos of moderate "jerking of the hands."  she has associated muscle cramps.  Current Medications (verified): 1)  Skelaxin 800 Mg Tabs (Metaxalone) .... Three Times A Day 2)  Metformin Hcl 500 Mg Tabs (Metformin Hcl) .... Once Daily 3)  Methocarbamol 500 Mg Tabs (Methocarbamol) .... Once Daily 4)  Tricor 145 Mg Tabs (Fenofibrate) .... Once Daily 5)  Alprazolam 1 Mg Tabs (Alprazolam) .... Once Daily 6)  Advair Diskus 250-50 Mcg/dose Misc (Fluticasone-Salmeterol) .Marland Kitchen.. 1 Puff Two Times A Day , Rinse/gargle Well After Use. 7)  Fosamax 10 Mg Tabs (Alendronate Sodium) .Marland Kitchen.. 1tab Once Daily 8)  Vitamin D 50,000 Units 9)  Lopid 600 Mg Tabs (Gemfibrozil) .Marland Kitchen.. 1 Two Times A Day  Allergies (verified): 1)  ! Codeine  Past History:  Past Medical History: Last updated: 10/20/2008 Crohn's Disease C O P D Emphysema Hyperlipidemia ovarian CA s/p RT at [facility], Type 2  Family History: Reviewed history from 10/20/2008 and no changes required. emphysema--Father--sister---brother..All deceased cancer--ovarian---self Mother- skin cancer sisters- cervix mother had uncertain type of thyroid dz  Social  History: Reviewed history from 10/20/2008 and no changes required. 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  The patient denies fever.         denies hoarseness, visual loss, sob, polyuria, excessive diaphoresis, numbness, hypoglycemia, and rhinorrhea.   she reports palpitations, myalgias, anxiety, polyuria, easy bruising, and diarrhea.  she attributes weight loss to cancer and its rx.   Physical Exam  General:  obese.  no distress  Head:  head: no deformity eyes: no periorbital swelling, no proptosis external nose and ears are normal mouth: no lesion seen Neck:  uncertain if i can feel the top of a goiter Lungs:  Clear to auscultation bilaterally. Normal respiratory effort.  Heart:  Regular rate and rhythm without murmurs or gallops noted. Normal S1,S2.   Msk:  muscle bulk and strength are grossly normal.  no obvious joint swelling.  gait is normal and steady.  Pulses:  dorsalis pedis intact bilat.  no carotid bruit.  Extremities:  no deformity. no edema. Neurologic:  cn 2-12 grossly intact.   readily moves all 4's.   sensation is intact to touch on the feet.  Skin:  normal texture and temp.  no rash.  not diaphoretic  Cervical Nodes:  No significant adenopathy.  Psych:  Alert and cooperative; normal mood and affect; normal attention span and concentration.   Additional Exam:  outside test  results are reviewed:  thyroid nuc med scan: grave's dz, with increased uptake  today: FastTSH              [L]  0.02 uIU/mL                 0.35-5.50 Free T4                   1.5 ng/dL      Impression & Recommendations:  Problem # 1:  HYPERTHYROIDISM (ICD-242.90) Assessment New due to grave's dz  Problem # 2:  ADENOCARCINOMA, OVARY (ICD-183.0) this, and its rx, are as likely as #1 to cause weight loss  Problem # 3:  cramps this may resolve with rx of #1  Medications Added to Medication  List This Visit: 1)  Fosamax 10 Mg Tabs (Alendronate sodium) .Marland Kitchen.. 1tab once daily 2)  Vitamin D 50,000 Units  3)  Lopid 600 Mg Tabs (Gemfibrozil) .Marland Kitchen.. 1 two times a day  Other Orders: TLB-TSH (Thyroid Stimulating Hormone) (84443-TSH) TLB-T4 (Thyrox), Free [phone]) Radiology Referral (Radiology) Consultation Level IV [phone])  Patient Instructions: 1)  tests are being ordered for you today.  a few days after the test(s), please call [phone] to hear your test results. 2)  pending the test results, we discussed the causes, risks, and treatment options of hyperthyroidism. 3)  please consider the radioactive iodine treatment, which i am happy to order for you. 4)  return 6 weeks later. 5)  (update: i left message on phone-tree:  i ordered i-131.  ret 6 weeks later).

## 2010-12-19 NOTE — Progress Notes (Signed)
  Phone Note Outgoing Call   Call placed by: Brenton Grills MA,  May 21, 2010 2:36 PM Details for Reason: OV due Summary of Call: F/U OV is due. Called pt, pt stated she will call in to sched appt at end of the month when she receives disability check

## 2010-12-19 NOTE — Progress Notes (Signed)
Summary: [name] pt / cramps  Phone Note Call from Patient Call back at [facility] Phone [phone]   Summary of Call: Patient is requesting to increase methocarbamol. She c/o increase in muscle cramps all over her body. She currently is taking 6 pills daily. Please advise.  Initial call taken by: Charlsie Quest, [place],  July 05, 2010 4:53 PM  Follow-up for Phone Call        pretty high dose of methocarbamol. Reviewed labs - no general labs. Is [name] her PCP or consultant? She needs lab - K, Mg for cramps. She may start with gabapentin 122m two times a day and advance under Dr. ECordelia Pen( or her PCPs) direction. Follow-up by: MNeena RhymesMD,  July 05, 2010 5:30 PM  Additional Follow-up for Phone Call Additional follow up Details #1::        No answer, no voice mail, rx sent in..........Marland KitchenCharlsie Quest CMA  July 05, 2010 5:34 PM   Per pharmacy, Rx picked up. Pt has f/u appt scheduled with SAE Sept 1st Additional Follow-up by: DCrissie Sickles CMA,  July 08, 2010 8:17 AM    New/Updated Medications: GABAPENTIN 100 MG CAPS (GABAPENTIN) 1 two times a day Prescriptions: GABAPENTIN 100 MG CAPS (GABAPENTIN) 1 two times a day  #60 x 0   Entered by:   SCharlsie Quest CMA   Authorized by:   MNeena RhymesMD   Signed by:   SCharlsie Quest CMA on 07/05/2010   Method used:   Electronically to        [facility]#[phone] (retail)       [address]      [place]  [address]      Ph: [phone]      Fax: [phone]  RxID:   [id number]

## 2011-01-30 LAB — CBC
HCT: 41.4 % (ref 36.0–46.0)
Hemoglobin: 14.4 g/dL (ref 12.0–15.0)
MCH: 33.8 pg (ref 26.0–34.0)
MCHC: 34.8 g/dL (ref 30.0–36.0)
MCV: 97.2 fL (ref 78.0–100.0)
Platelets: 214 10*3/uL (ref 150–400)
RBC: 4.26 MIL/uL (ref 3.87–5.11)
RDW: 14.2 % (ref 11.5–15.5)
WBC: 8 10*3/uL (ref 4.0–10.5)

## 2011-01-30 LAB — DIFFERENTIAL
Basophils Absolute: 0 10*3/uL (ref 0.0–0.1)
Basophils Relative: 0 % (ref 0–1)
Eosinophils Absolute: 0 10*3/uL (ref 0.0–0.7)
Eosinophils Relative: 1 % (ref 0–5)
Lymphocytes Relative: 24 % (ref 12–46)
Lymphs Abs: 1.9 10*3/uL (ref 0.7–4.0)
Monocytes Absolute: 0.5 10*3/uL (ref 0.1–1.0)
Monocytes Relative: 6 % (ref 3–12)
Neutro Abs: 5.6 10*3/uL (ref 1.7–7.7)
Neutrophils Relative %: 70 % (ref 43–77)

## 2011-01-30 LAB — URINALYSIS, ROUTINE W REFLEX MICROSCOPIC
Bilirubin Urine: NEGATIVE
Glucose, UA: NEGATIVE mg/dL
Hgb urine dipstick: NEGATIVE
Ketones, ur: NEGATIVE mg/dL
Nitrite: NEGATIVE
Protein, ur: NEGATIVE mg/dL
Specific Gravity, Urine: 1.025 (ref 1.005–1.030)
Urobilinogen, UA: 0.2 mg/dL (ref 0.0–1.0)
pH: 5.5 (ref 5.0–8.0)

## 2011-01-30 LAB — COMPREHENSIVE METABOLIC PANEL
ALT: 18 U/L (ref 0–35)
AST: 32 U/L (ref 0–37)
Albumin: 4.1 g/dL (ref 3.5–5.2)
Alkaline Phosphatase: 175 U/L — ABNORMAL HIGH (ref 39–117)
BUN: 19 mg/dL (ref 6–23)
CO2: 27 mEq/L (ref 19–32)
Calcium: 9.4 mg/dL (ref 8.4–10.5)
Chloride: 103 mEq/L (ref 96–112)
Creatinine, Ser: 1.08 mg/dL (ref 0.4–1.2)
GFR calc Af Amer: >60 mL/min (ref >60)
GFR calc non Af Amer: 51 mL/min — ABNORMAL LOW (ref >60)
Glucose, Bld: 219 mg/dL — ABNORMAL HIGH (ref 70–99)
Potassium: 4.2 mEq/L (ref 3.5–5.1)
Sodium: 138 mEq/L (ref 135–145)
Total Bilirubin: 0.6 mg/dL (ref 0.3–1.2)
Total Protein: 7.4 g/dL (ref 6.0–8.3)

## 2011-02-25 ENCOUNTER — Other Ambulatory Visit: Payer: Self-pay | Admitting: Endocrinology

## 2011-04-01 NOTE — Op Note (Signed)
[name], [name]              ACCOUNT NO.:  [id number]   MEDICAL RECORD NO.:  [id number]          PATIENT TYPE:  AMB   LOCATION:  [place]                          FACILITY:  [facility]   PHYSICIAN:  Merri Ray. Grandville Silos, M.D.DATE OF BIRTH:  11-22-1945   DATE OF PROCEDURE:  09/20/2007  DATE OF DISCHARGE:                               OPERATIVE REPORT   PREOPERATIVE DIAGNOSIS:  Mass right chest wall.   POSTOPERATIVE DIAGNOSIS:  Mass right chest wall.   PROCEDURE:  Excision mass right chest wall 2 cm x 4 cm.   SURGEON:  Georganna Skeans, M.D.   ANESTHESIA:  General with laryngeal mask airway.   HISTORY OF PRESENT ILLNESS:  [name] is a 65 year old female who I  evaluated the office for a painful mass on her inferior right chest wall  beneath her breast overlying her ribs.  She presents today for elective  excision.   PROCEDURE IN DETAIL:  Informed consent was obtained.  The patient's site  was marked.  She received intravenous antibiotics.  She was brought to  the operating room.  General anesthesia was administered with laryngeal  mask airway by the anesthesia staff.  Her chest and breast area were  prepped and draped in sterile fashion.  0.25% Marcaine with epinephrine  was injected overlying the mass.  A transverse incision was made.  Subcutaneous tissues were dissected down revealing this well  circumscribed mass that was dissected in a circumferential fashion.  It  was well encapsulated, especially the deep portion.  It was dissected  completely in one piece and sent to pathology.  The wound was copiously  irrigated.  Meticulous hemostasis was obtained with Bovie cautery.  The  wound was then closed with subcutaneous tissues approximated with  interrupted 3-0 Vicryl sutures.  The area was again irrigated.  The skin  was closed with running 4-0 Monocryl subcuticular stitch.  Sponge,  needle and instrument counts were correct.  Benzoin, Steri-Strips and  sterile dressings were  applied.  The patient tolerated procedure well  without apparent complication, was taken recovery in stable condition.      Merri Ray Grandville Silos, M.D.  Electronically Signed     BET/MEDQ  D:  09/20/2007  T:  09/21/2007  Job:  [id number]   cc:   Doran Heater. Warnell Forester, M.D.  [facility]

## 2011-04-10 ENCOUNTER — Encounter: Payer: Self-pay | Admitting: Endocrinology

## 2011-04-10 ENCOUNTER — Other Ambulatory Visit (INDEPENDENT_AMBULATORY_CARE_PROVIDER_SITE_OTHER): Payer: Medicare Other

## 2011-04-10 ENCOUNTER — Ambulatory Visit (INDEPENDENT_AMBULATORY_CARE_PROVIDER_SITE_OTHER): Payer: Medicare Other | Admitting: Endocrinology

## 2011-04-10 VITALS — BP 128/74 | HR 83 | Temp 98.0°F | Ht 61.0 in | Wt 170.2 lb

## 2011-04-10 DIAGNOSIS — E119 Type 2 diabetes mellitus without complications: Secondary | ICD-10-CM

## 2011-04-10 LAB — TSH: TSH: 0.39 u[IU]/mL (ref 0.35–5.50)

## 2011-04-10 NOTE — Progress Notes (Signed)
Subjective:    Patient ID: [name], female    DOB: [birth year], 65 y.o.   MRN: [id number]  HPI pt is now approx 13 1/2 months s/p i-131 rx for hyperthyroidism due to grave's dz.  she takes synthroid as rx'ed, for post-i-131 hypothyroidism.  pt states she feels well in general, except for muscle cramps. Past Medical History  Diagnosis Date  . CROHN'S DISEASE 10/20/2008  . C O P D 10/20/2008  . EMPHYSEMA 10/20/2008  . HYPERLIPIDEMIA 10/20/2008  . ADENOCARCINOMA, OVARY 02/12/2010  . HYPOTHYROIDISM, POST-RADIATION 06/06/2010  . DIABETES, TYPE 2 10/20/2008  . MUSCLE CRAMPS 09/19/2010    Past Surgical History  Procedure Date  . Appendectomy     History   Social History  . Marital Status: Married    Spouse Name: N/A    Number of Children: N/A  . Years of Education: N/A   Occupational History  . Disabled    Social History Main Topics  . Smoking status: Current Everyday Smoker  . Smokeless tobacco: Not on file   Comment: 5 cigarettes a day  . Alcohol Use: Not on file  . Drug Use: Not on file  . Sexually Active: Not on file   Other Topics Concern  . Not on file   Social History Narrative   Married and lives with husbandHas childrenHousewifedisabled    Current Outpatient Prescriptions on File Prior to Visit  Medication Sig Dispense Refill  . alendronate (FOSAMAX) 10 MG tablet Take 10 mg by mouth daily before breakfast. Take with a full glass of water on an empty stomach.       . diazepam (VALIUM) 5 MG tablet 1-2 tablets three times a day as needed for muscle spasms and cramps       . ergocalciferol (VITAMIN D2) 50000 UNITS capsule Take 50,000 Units by mouth once a week.        . Fluticasone-Salmeterol (ADVAIR DISKUS) 250-50 MCG/DOSE AEPB Inhale 1 puff into the lungs 2 (two) times daily. Rinse/gargle well after use.       . gabapentin (NEURONTIN) 100 MG capsule Take 100 mg by mouth 2 (two) times daily.        Marland Kitchen gemfibrozil (LOPID) 600 MG tablet Take 600 mg by mouth 2 (two)  times daily.        Marland Kitchen levothyroxine (SYNTHROID, LEVOTHROID) 125 MCG tablet Take 125 mcg by mouth daily.        . metFORMIN (GLUCOPHAGE-XR) 500 MG 24 hr tablet 2 tablets two times a day       . methocarbamol (ROBAXIN) 500 MG tablet TAKE ONE TABLET BY MOUTH EVERY 6 HOURS AS NEEDED FOR CRAMPS  30 tablet  2    Allergies  Allergen Reactions  . Codeine     Family History  Problem Relation Age of Onset  . Cancer Mother     Skin Cancer  . Thyroid disease Mother     uncertain type   . Emphysema Father   . Emphysema Sister   . Emphysema Brother     BP 128/74  Pulse 83  Temp(Src) 98 F (36.7 C) (Oral)  Ht 5\' 1"  (1.549 m)  Wt 170 lb 3.2 oz (77.202 kg)  BMI 32.16 kg/m2  SpO2 95%   Review of Systems Denies fever.    Objective:   Physical Exam GENERAL: no distress Neck - No masses or thyromegaly    Lab Results  Component Value Date   TSH 0.39 04/10/2011     Assessment &  Plan:  Post-i-131 hypothyroidism, well-replaced

## 2011-04-10 NOTE — Patient Instructions (Addendum)
blood tests are being ordered for you today.  please call [phone] to hear your test results.  You will be prompted to enter the 9-digit "MRN" number that appears at the top left of this page, followed by #.  Then you will hear the message. You can return here as needed.  you should have an annual thyroid blood test.   You will probably need the thyroid medication for the rest of your life.  Cc dr Melven Sartorius ([facility]).   (update: i left message on phone-tree:  rx as we discussed)

## 2011-05-10 ENCOUNTER — Other Ambulatory Visit: Payer: Self-pay | Admitting: Endocrinology

## 2011-06-11 ENCOUNTER — Other Ambulatory Visit: Payer: Self-pay | Admitting: Endocrinology

## 2011-08-26 LAB — POCT HEMOGLOBIN-HEMACUE
Hemoglobin: 15.9 — ABNORMAL HIGH
Operator id: 123881

## 2012-02-20 ENCOUNTER — Other Ambulatory Visit: Payer: Self-pay | Admitting: Family Medicine

## 2012-02-20 DIAGNOSIS — Z1231 Encounter for screening mammogram for malignant neoplasm of breast: Secondary | ICD-10-CM

## 2012-03-02 ENCOUNTER — Ambulatory Visit
Admission: RE | Admit: 2012-03-02 | Discharge: 2012-03-02 | Disposition: A | Discharge status: Home / Self Care | Payer: Medicare Other | Source: Ambulatory Visit | Attending: Family Medicine | Admitting: Family Medicine

## 2012-03-02 DIAGNOSIS — Z1231 Encounter for screening mammogram for malignant neoplasm of breast: Secondary | ICD-10-CM

## 2014-03-27 ENCOUNTER — Ambulatory Visit (INDEPENDENT_AMBULATORY_CARE_PROVIDER_SITE_OTHER): Payer: Medicare Other | Admitting: Podiatry

## 2014-03-27 ENCOUNTER — Encounter: Payer: Self-pay | Admitting: Podiatry

## 2014-03-27 VITALS — BP 125/79 | HR 75 | Resp 16 | Ht 60.0 in | Wt 147.0 lb

## 2014-03-27 DIAGNOSIS — L84 Corns and callosities: Secondary | ICD-10-CM

## 2014-03-27 NOTE — Patient Instructions (Signed)
Diabetes and Foot Care Diabetes may cause you to have problems because of poor blood supply (circulation) to your feet and legs. This may cause the skin on your feet to become thinner, break easier, and heal more slowly. Your skin may become dry, and the skin may peel and crack. You may also have nerve damage in your legs and feet causing decreased feeling in them. You may not notice minor injuries to your feet that could lead to infections or more serious problems. Taking care of your feet is one of the most important things you can do for yourself.  HOME CARE INSTRUCTIONS  Wear shoes at all times, even in the house. Do not go barefoot. Bare feet are easily injured.  Check your feet daily for blisters, cuts, and redness. If you cannot see the bottom of your feet, use a mirror or ask someone for help.  Wash your feet with warm water (do not use hot water) and mild soap. Then pat your feet and the areas between your toes until they are completely dry. Do not soak your feet as this can dry your skin.  Apply a moisturizing lotion or petroleum jelly (that does not contain alcohol and is unscented) to the skin on your feet and to dry, brittle toenails. Do not apply lotion between your toes.  Trim your toenails straight across. Do not dig under them or around the cuticle. File the edges of your nails with an emery board or nail file.  Do not cut corns or calluses or try to remove them with medicine.  Wear clean socks or stockings every day. Make sure they are not too tight. Do not wear knee-high stockings since they may decrease blood flow to your legs.  Wear shoes that fit properly and have enough cushioning. To break in new shoes, wear them for just a few hours a day. This prevents you from injuring your feet. Always look in your shoes before you put them on to be sure there are no objects inside.  Do not cross your legs. This may decrease the blood flow to your feet.  If you find a minor scrape,  cut, or break in the skin on your feet, keep it and the skin around it clean and dry. These areas may be cleansed with mild soap and water. Do not cleanse the area with peroxide, alcohol, or iodine.  When you remove an adhesive bandage, be sure not to damage the skin around it.  If you have a wound, look at it several times a day to make sure it is healing.  Do not use heating pads or hot water bottles. They may burn your skin. If you have lost feeling in your feet or legs, you may not know it is happening until it is too late.  Make sure your health care provider performs a complete foot exam at least annually or more often if you have foot problems. Report any cuts, sores, or bruises to your health care provider immediately. SEEK MEDICAL CARE IF:   You have an injury that is not healing.  You have cuts or breaks in the skin.  You have an ingrown nail.  You notice redness on your legs or feet.  You feel burning or tingling in your legs or feet.  You have pain or cramps in your legs and feet.  Your legs or feet are numb.  Your feet always feel cold. SEEK IMMEDIATE MEDICAL CARE IF:   There is increasing redness,   swelling, or pain in or around a wound.  There is a red line that goes up your leg.  Pus is coming from a wound.  You develop a fever or as directed by your health care provider.  You notice a bad smell coming from an ulcer or wound. Document Released: 10/31/2000 Document Revised: 07/06/2013 Document Reviewed: 04/12/2013 [employer] Patient Information 2014 [employer].  

## 2014-03-27 NOTE — Progress Notes (Signed)
   Subjective:    Patient ID: [name], female    DOB: June 06, 1946, 68 y.o.   MRN: [id number]  HPI Comments: N keratosis L Left 1st toe and 5th MPJ plantar D and O 1st toe - 2 years, 5th MPJ - 1 month C hard painful area A walking, and diabetes T none  Toe Pain       Review of Systems  Constitutional: Positive for diaphoresis and fatigue.  HENT: Positive for sinus pressure, sore throat and trouble swallowing.   Respiratory: Positive for cough, shortness of breath and wheezing.   Cardiovascular: Positive for leg swelling.       Calf pain with walking  Gastrointestinal: Positive for diarrhea.  Genitourinary: Positive for frequency.  Neurological: Positive for dizziness, weakness, light-headedness and headaches.  Hematological: Bruises/bleeds easily.       Slow to heal.  Psychiatric/Behavioral: The patient is nervous/anxious.   All other systems reviewed and are negative.      Objective:   Physical Exam Orientated x3 white female  Vascular: DP and PT pulses 2/4 bilaterally  Neurological: Sensation to 10 g monofilament wire intact 4/5 bilaterally Vibratory sensation nonreactive bilaterally Ankle reflexes are reactive bilaterally  Dermatological: Massive hyperkeratotic lesion plantar left interphalangeal joint area approximately 20 mm in diameter and approximately 20 mm in depth. Punctate hemorrhagic keratoses plantar fourth left MPJ noted  Musculoskeletal: No restriction ankle, subtalar, midtarsal joints bilaterally       Assessment & Plan:   Assessment: Diabetic peripheral neuropathy bilaterally Satisfactory vascular status Hyperkeratotic lesion left hallux Hyperkeratotic lesion plantar wart MPJ versus possible foreign body  Plan: Hyperkeratotic tissue was debrided on the left hallux without any bleeding. There is no reactive tissue surrounding the hyperkeratotic lesion after debridement on the left hallux.  Plantar fourth left MPJ was debrided  without any evidence of obvious foreign body, drainage, warmth, or malodor. The area was dressed with antibiotic ointment.  Patient given General Information about diabetic footcare  Reappoint as needed or at yearly intervals to

## 2014-03-28 ENCOUNTER — Encounter: Payer: Self-pay | Admitting: Podiatry

## 2014-05-27 ENCOUNTER — Encounter (HOSPITAL_COMMUNITY): Payer: Self-pay | Admitting: Emergency Medicine

## 2014-05-27 ENCOUNTER — Emergency Department (HOSPITAL_COMMUNITY): Payer: Medicare Other

## 2014-05-27 ENCOUNTER — Other Ambulatory Visit (HOSPITAL_COMMUNITY): Payer: Medicare Other

## 2014-05-27 ENCOUNTER — Emergency Department (HOSPITAL_COMMUNITY)
Admission: EM | Admit: 2014-05-27 | Discharge: 2014-05-27 | Disposition: A | Discharge status: Home / Self Care | Payer: Medicare Other | Attending: Emergency Medicine | Admitting: Emergency Medicine

## 2014-05-27 DIAGNOSIS — J4489 Other specified chronic obstructive pulmonary disease: Secondary | ICD-10-CM | POA: Insufficient documentation

## 2014-05-27 DIAGNOSIS — J449 Chronic obstructive pulmonary disease, unspecified: Secondary | ICD-10-CM | POA: Insufficient documentation

## 2014-05-27 DIAGNOSIS — Y9389 Activity, other specified: Secondary | ICD-10-CM | POA: Insufficient documentation

## 2014-05-27 DIAGNOSIS — Z8709 Personal history of other diseases of the respiratory system: Secondary | ICD-10-CM | POA: Insufficient documentation

## 2014-05-27 DIAGNOSIS — R739 Hyperglycemia, unspecified: Secondary | ICD-10-CM

## 2014-05-27 DIAGNOSIS — Z7983 Long term (current) use of bisphosphonates: Secondary | ICD-10-CM | POA: Insufficient documentation

## 2014-05-27 DIAGNOSIS — E785 Hyperlipidemia, unspecified: Secondary | ICD-10-CM | POA: Insufficient documentation

## 2014-05-27 DIAGNOSIS — Z8543 Personal history of malignant neoplasm of ovary: Secondary | ICD-10-CM | POA: Insufficient documentation

## 2014-05-27 DIAGNOSIS — E039 Hypothyroidism, unspecified: Secondary | ICD-10-CM | POA: Insufficient documentation

## 2014-05-27 DIAGNOSIS — W1809XA Striking against other object with subsequent fall, initial encounter: Secondary | ICD-10-CM | POA: Insufficient documentation

## 2014-05-27 DIAGNOSIS — N3000 Acute cystitis without hematuria: Secondary | ICD-10-CM | POA: Insufficient documentation

## 2014-05-27 DIAGNOSIS — F17200 Nicotine dependence, unspecified, uncomplicated: Secondary | ICD-10-CM | POA: Insufficient documentation

## 2014-05-27 DIAGNOSIS — Y92009 Unspecified place in unspecified non-institutional (private) residence as the place of occurrence of the external cause: Secondary | ICD-10-CM | POA: Insufficient documentation

## 2014-05-27 DIAGNOSIS — Z79899 Other long term (current) drug therapy: Secondary | ICD-10-CM | POA: Insufficient documentation

## 2014-05-27 DIAGNOSIS — E119 Type 2 diabetes mellitus without complications: Secondary | ICD-10-CM | POA: Insufficient documentation

## 2014-05-27 DIAGNOSIS — S0990XA Unspecified injury of head, initial encounter: Secondary | ICD-10-CM | POA: Insufficient documentation

## 2014-05-27 LAB — URINALYSIS, ROUTINE W REFLEX MICROSCOPIC
Bilirubin Urine: NEGATIVE
Glucose, UA: >1000 mg/dL — AB
Ketones, ur: NEGATIVE mg/dL
Nitrite: POSITIVE — AB
Protein, ur: NEGATIVE mg/dL
Specific Gravity, Urine: 1.027 (ref 1.005–1.030)
Urobilinogen, UA: 0.2 mg/dL (ref 0.0–1.0)
pH: 5 (ref 5.0–8.0)

## 2014-05-27 LAB — COMPREHENSIVE METABOLIC PANEL
ALT: 9 U/L (ref 0–35)
AST: 10 U/L (ref 0–37)
Albumin: 3.5 g/dL (ref 3.5–5.2)
Alkaline Phosphatase: 165 U/L — ABNORMAL HIGH (ref 39–117)
Anion gap: 17 — ABNORMAL HIGH (ref 5–15)
BUN: 8 mg/dL (ref 6–23)
CO2: 24 mEq/L (ref 19–32)
Calcium: 8.9 mg/dL (ref 8.4–10.5)
Chloride: 96 mEq/L (ref 96–112)
Creatinine, Ser: 0.62 mg/dL (ref 0.50–1.10)
GFR calc Af Amer: >90 mL/min (ref >90)
GFR calc non Af Amer: >90 mL/min (ref >90)
Glucose, Bld: 450 mg/dL — ABNORMAL HIGH (ref 70–99)
Potassium: 4 mEq/L (ref 3.7–5.3)
Sodium: 137 mEq/L (ref 137–147)
Total Bilirubin: 0.5 mg/dL (ref 0.3–1.2)
Total Protein: 6.8 g/dL (ref 6.0–8.3)

## 2014-05-27 LAB — CBC
HCT: 39.4 % (ref 36.0–46.0)
Hemoglobin: 13.6 g/dL (ref 12.0–15.0)
MCH: 31.1 pg (ref 26.0–34.0)
MCHC: 34.5 g/dL (ref 30.0–36.0)
MCV: 90 fL (ref 78.0–100.0)
Platelets: 169 10*3/uL (ref 150–400)
RBC: 4.38 MIL/uL (ref 3.87–5.11)
RDW: 13.1 % (ref 11.5–15.5)
WBC: 5.2 10*3/uL (ref 4.0–10.5)

## 2014-05-27 LAB — URINE MICROSCOPIC-ADD ON

## 2014-05-27 LAB — CBG MONITORING, ED: Glucose-Capillary: 308 mg/dL — ABNORMAL HIGH (ref 70–99)

## 2014-05-27 MED ORDER — CEPHALEXIN 500 MG PO CAPS: 500.0000 mg | ORAL_CAPSULE | Freq: Four times a day (QID) | ORAL | Status: DC

## 2014-05-27 MED ORDER — INSULIN ASPART 100 UNIT/ML ~~LOC~~ SOLN
8.0000 [IU] | Freq: Once | SUBCUTANEOUS | Status: AC
  Administered 2014-05-27: 8 [IU] via INTRAVENOUS
  Filled 2014-05-27: qty 1

## 2014-05-27 MED ORDER — SODIUM CHLORIDE 0.9 % IV BOLUS (SEPSIS)
1000.0000 mL | Freq: Once | INTRAVENOUS | Status: AC
  Administered 2014-05-27: 1000 mL via INTRAVENOUS

## 2014-05-27 NOTE — ED Notes (Signed)
Pts CBg was 308 reported to nurse. Pt continues to be monitored by blood pressure, pulse ox, and 5 lead.

## 2014-05-27 NOTE — Discharge Instructions (Signed)
Keflex as prescribed.  Keep record of your blood sugars for the next several days. Take this with you when you followup with your primary doctor next week to discuss the results.  Return to the emergency department if he develops severe headache, seizures, or other new and concerning symptoms.   Head Injury, Adult You have a head injury. Headaches and throwing up (vomiting) are common after a head injury. It should be easy to wake up from sleeping. Sometimes you must stay in the hospital. Most problems happen within the first 24 hours. Side effects may occur up to 7-10 days after the injury.  WHAT ARE THE TYPES OF HEAD INJURIES? Head injuries can be as minor as a bump. Some head injuries can be more severe. More severe head injuries include:  A jarring injury to the brain (concussion).  A bruise of the brain (contusion). This mean there is bleeding in the brain that can cause swelling.  A cracked skull (skull fracture).  Bleeding in the brain that collects, clots, and forms a bump (hematoma). . WHEN SHOULD I GET HELP RIGHT AWAY?   You are confused or sleepy.  You cannot be woken up.  You feel sick to your stomach (nauseous) or keep throwing up.  Your dizziness or unsteadiness is get worse.  You have very bad, lasting headaches that are not helped by medicine.  You cannot use your arms or legs like normal  You cannot walk.  You notice changes in the black spots in the center of the colored part of your eye (pupil).  You have clear or bloody fluid coming from your nose or ears.  You have trouble seeing. During the next 24 hours after the injury, you must stay with someone who can watch you. This person should get help right away (call 911 in the [place] if you start to shake and are not able to control it (seizures), you become pass out, or you are unable to wake up. HOW CAN I PREVENT A HEAD INJURY IN THE FUTURE?  Wear seat belts.  Wear helmets while bike riding and playing  sports like football.  Stay away from dangerous activities around the house. WHEN CAN I RETURN TO NORMAL ACTIVITIES AND ATHLETICS? See your doctor before doing these activities. You should not do normal activities or play contact sports until 1 week after the following symptoms have stopped:  Headache that does not go away.  Dizziness.  Poor attention.  Confusion.  Memory problems.  Sickness to your stomach or throwing up.  Tiredness.  Fussiness.  Bothered by bright lights or loud noises.  Anxiousness or depression.  Restless sleep. MAKE SURE YOU:   Understand these instructions.  Will watch your condition.  Will get help right away if you are not doing well or get worse. Document Released: 10/16/2008 Document Revised: 08/24/2013 Document Reviewed: 07/11/2013 [facility] Patient Information 2015 [place], [place]. This information is not intended to replace advice given to you by your health care provider. Make sure you discuss any questions you have with your health care provider.  Hyperglycemia Hyperglycemia occurs when the glucose (sugar) in your blood is too high. Hyperglycemia can happen for many reasons, but it most often happens to people who do not know they have diabetes or are not managing their diabetes properly.  CAUSES  Whether you have diabetes or not, there are other causes of hyperglycemia. Hyperglycemia can occur when you have diabetes, but it can also occur in other situations that you might not  be as aware of, such as: Diabetes  If you have diabetes and are having problems controlling your blood glucose, hyperglycemia could occur because of some of the following reasons:  Not following your meal plan.  Not taking your diabetes medications or not taking it properly.  Exercising less or doing less activity than you normally do.  Being sick. Pre-diabetes  This cannot be ignored. Before people develop Type 2 diabetes, they almost always have  "pre-diabetes." This is when your blood glucose levels are higher than normal, but not yet high enough to be diagnosed as diabetes. Research has shown that some long-term damage to the body, especially the heart and circulatory system, may already be occurring during pre-diabetes. If you take action to manage your blood glucose when you have pre-diabetes, you may delay or prevent Type 2 diabetes from developing. Stress  If you have diabetes, you may be "diet" controlled or on oral medications or insulin to control your diabetes. However, you may find that your blood glucose is higher than usual in the hospital whether you have diabetes or not. This is often referred to as "stress hyperglycemia." Stress can elevate your blood glucose. This happens because of hormones put out by the body during times of stress. If stress has been the cause of your high blood glucose, it can be followed regularly by your caregiver. That way he/she can make sure your hyperglycemia does not continue to get worse or progress to diabetes. Steroids  Steroids are medications that act on the infection fighting system (immune system) to block inflammation or infection. One side effect can be a rise in blood glucose. Most people can produce enough extra insulin to allow for this rise, but for those who cannot, steroids make blood glucose levels go even higher. It is not unusual for steroid treatments to "uncover" diabetes that is developing. It is not always possible to determine if the hyperglycemia will go away after the steroids are stopped. A special blood test called an A1c is sometimes done to determine if your blood glucose was elevated before the steroids were started. SYMPTOMS  Thirsty.  Frequent urination.  Dry mouth.  Blurred vision.  Tired or fatigue.  Weakness.  Sleepy.  Tingling in feet or leg. DIAGNOSIS  Diagnosis is made by monitoring blood glucose in one or all of the following ways:  A1c test. This  is a chemical found in your blood.  Fingerstick blood glucose monitoring.  Laboratory results. TREATMENT  First, knowing the cause of the hyperglycemia is important before the hyperglycemia can be treated. Treatment may include, but is not be limited to:  Education.  Change or adjustment in medications.  Change or adjustment in meal plan.  Treatment for an illness, infection, etc.  More frequent blood glucose monitoring.  Change in exercise plan.  Decreasing or stopping steroids.  Lifestyle changes. HOME CARE INSTRUCTIONS   Test your blood glucose as directed.  Exercise regularly. Your caregiver will give you instructions about exercise. Pre-diabetes or diabetes which comes on with stress is helped by exercising.  Eat wholesome, balanced meals. Eat often and at regular, fixed times. Your caregiver or nutritionist will give you a meal plan to guide your sugar intake.  Being at an ideal weight is important. If needed, losing as little as 10 to 15 pounds may help improve blood glucose levels. SEEK MEDICAL CARE IF:   You have questions about medicine, activity, or diet.  You continue to have symptoms (problems such as increased thirst,  urination, or weight gain). SEEK IMMEDIATE MEDICAL CARE IF:   You are vomiting or have diarrhea.  Your breath smells fruity.  You are breathing faster or slower.  You are very sleepy or incoherent.  You have numbness, tingling, or pain in your feet or hands.  You have chest pain.  Your symptoms get worse even though you have been following your caregiver's orders.  If you have any other questions or concerns. Document Released: 04/29/2001 Document Revised: 01/26/2012 Document Reviewed: 03/01/2012 [facility] Patient Information 2015 [place], [place]. This information is not intended to replace advice given to you by your health care provider. Make sure you discuss any questions you have with your health care provider.  Urinary Tract  Infection Urinary tract infections (UTIs) can develop anywhere along your urinary tract. Your urinary tract is your body's drainage system for removing wastes and extra water. Your urinary tract includes two kidneys, two ureters, a bladder, and a urethra. Your kidneys are a pair of bean-shaped organs. Each kidney is about the size of your fist. They are located below your ribs, one on each side of your spine. CAUSES Infections are caused by microbes, which are microscopic organisms, including fungi, viruses, and bacteria. These organisms are so small that they can only be seen through a microscope. Bacteria are the microbes that most commonly cause UTIs. SYMPTOMS  Symptoms of UTIs may vary by age and gender of the patient and by the location of the infection. Symptoms in young women typically include a frequent and intense urge to urinate and a painful, burning feeling in the bladder or urethra during urination. Older women and men are more likely to be tired, shaky, and weak and have muscle aches and abdominal pain. A fever may mean the infection is in your kidneys. Other symptoms of a kidney infection include pain in your back or sides below the ribs, nausea, and vomiting. DIAGNOSIS To diagnose a UTI, your caregiver will ask you about your symptoms. Your caregiver also will ask to provide a urine sample. The urine sample will be tested for bacteria and white blood cells. White blood cells are made by your body to help fight infection. TREATMENT  Typically, UTIs can be treated with medication. Because most UTIs are caused by a bacterial infection, they usually can be treated with the use of antibiotics. The choice of antibiotic and length of treatment depend on your symptoms and the type of bacteria causing your infection. HOME CARE INSTRUCTIONS  If you were prescribed antibiotics, take them exactly as your caregiver instructs you. Finish the medication even if you feel better after you have only taken  some of the medication.  Drink enough water and fluids to keep your urine clear or pale yellow.  Avoid caffeine, tea, and carbonated beverages. They tend to irritate your bladder.  Empty your bladder often. Avoid holding urine for long periods of time.  Empty your bladder before and after sexual intercourse.  After a bowel movement, women should cleanse from front to back. Use each tissue only once. SEEK MEDICAL CARE IF:   You have back pain.  You develop a fever.  Your symptoms do not begin to resolve within 3 days. SEEK IMMEDIATE MEDICAL CARE IF:   You have severe back pain or lower abdominal pain.  You develop chills.  You have nausea or vomiting.  You have continued burning or discomfort with urination. MAKE SURE YOU:   Understand these instructions.  Will watch your condition.  Will get help  right away if you are not doing well or get worse. Document Released: 08/13/2005 Document Revised: 05/04/2012 Document Reviewed: 12/12/2011 [facility] Patient Information 2015 [place], [place]. This information is not intended to replace advice given to you by your health care provider. Make sure you discuss any questions you have with your health care provider.

## 2014-05-27 NOTE — ED Notes (Signed)
Reports opening a cabinet door this am and fell backwards and hit her head on a cabinet door. Questionable loc, states "everything went black.' has small laceration to back of head. Went to [facility] and sent here for further eval due to cbg of [phone].

## 2014-05-27 NOTE — ED Notes (Signed)
Pt placed into gown and on monitor upon arrival to room. Pt monitored by 5 lead, blood pressure, and pulse ox.  

## 2014-05-27 NOTE — ED Provider Notes (Signed)
CSN: [id number]     Arrival date & time 05/27/14  1131 History   First MD Initiated Contact with Patient 05/27/14 1154     Chief Complaint  Patient presents with  . Fall  . Head Laceration  . Hyperglycemia     (Consider location/radiation/quality/duration/timing/severity/associated sxs/prior Treatment) HPI Comments: Patient is a 68 year old female with history of type 2 diabetes, Crohn's disease, COPD. She presents today with complaints of elevated blood sugar and fall. She got up this morning to get dressed when she became dizzy and fell backward and struck the back of her head on the nightstand. She is uncertain as to whether she lost consciousness. She did sustain a small laceration to the occiput. She denies headache or neck pain.  She initially went to [facility] urgent care who in turn sent her here for further evaluation. Her blood sugar there apparently was in the 400s and they are uncomfortable with this. Patient does state that her sugars have been running higher than normal lately. She denies any fevers or chills. She denies any chest pain or shortness of breath.  Patient is a 68 y.o. female presenting with fall, scalp laceration, and hyperglycemia. The history is provided by the patient.  Fall This is a new problem. The current episode started 1 to 2 hours ago. The problem occurs constantly. The problem has not changed since onset.Pertinent negatives include no chest pain, no abdominal pain, no headaches and no shortness of breath. Nothing aggravates the symptoms. Nothing relieves the symptoms. She has tried nothing for the symptoms. The treatment provided no relief.  Head Laceration Pertinent negatives include no chest pain, no abdominal pain, no headaches and no shortness of breath.  Hyperglycemia Associated symptoms: no abdominal pain, no chest pain and no shortness of breath     Past Medical History  Diagnosis Date  . CROHN'S DISEASE 10/20/2008  . C O P D 10/20/2008  .  EMPHYSEMA 10/20/2008  . HYPERLIPIDEMIA 10/20/2008  . ADENOCARCINOMA, OVARY 02/12/2010  . HYPOTHYROIDISM, POST-RADIATION 06/06/2010  . DIABETES, TYPE 2 10/20/2008  . MUSCLE CRAMPS 09/19/2010  . Bronchitis    Past Surgical History  Procedure Laterality Date  . Appendectomy    . Eye surgery    . Ovary surgery      radiation rods used  . Joint replacement     Family History  Problem Relation Age of Onset  . Cancer Mother     Skin Cancer  . Thyroid disease Mother     uncertain type   . Emphysema Father   . Emphysema Sister   . Emphysema Brother    History  Substance Use Topics  . Smoking status: Current Every Day Smoker  . Smokeless tobacco: Not on file     Comment: 5 cigarettes a day  . Alcohol Use: No   OB History   Grav Para Term Preterm Abortions TAB SAB Ect Mult Living                 Review of Systems  Respiratory: Negative for shortness of breath.   Cardiovascular: Negative for chest pain.  Gastrointestinal: Negative for abdominal pain.  Neurological: Negative for headaches.  All other systems reviewed and are negative.     Allergies  Codeine  Home Medications   Prior to Admission medications   Medication Sig Start Date End Date Taking? Authorizing Provider  alendronate (FOSAMAX) 10 MG tablet Take 10 mg by mouth daily before breakfast. Take with a full glass of water on  an empty stomach.     Historical Provider, MD  diazepam (VALIUM) 5 MG tablet 1-2 tablets three times a day as needed for muscle spasms and cramps     Historical Provider, MD  ergocalciferol (VITAMIN D2) 50000 UNITS capsule Take 50,000 Units by mouth once a week.      Historical Provider, MD  Fluticasone-Salmeterol (ADVAIR DISKUS) 250-50 MCG/DOSE AEPB Inhale 1 puff into the lungs 2 (two) times daily. Rinse/gargle well after use.     Historical Provider, MD  gabapentin (NEURONTIN) 100 MG capsule Take 300 mg by mouth 2 (two) times daily.     Historical Provider, MD  gemfibrozil (LOPID) 600 MG  tablet Take 600 mg by mouth 2 (two) times daily.      Historical Provider, MD  glipiZIDE-metformin (METAGLIP) 5-500 MG per tablet Take 1 tablet by mouth 2 (two) times daily before a meal.    Historical Provider, MD  levothyroxine (SYNTHROID, LEVOTHROID) 125 MCG tablet TAKE ONE TABLET BY MOUTH EVERY DAY 06/11/11   Renato Shin, MD  methocarbamol (ROBAXIN) 500 MG tablet TAKE ONE TABLET BY MOUTH EVERY 6 HOURS AS NEEDED FOR CRAMPS 05/10/11   Renato Shin, MD   BP 154/56  Pulse 78  Temp(Src) 98 F (36.7 C) (Oral)  Resp 18  Ht 5' (1.524 m)  Wt 130 lb (58.968 kg)  BMI 25.39 kg/m2  SpO2 96% Physical Exam  Nursing note and vitals reviewed. Constitutional: She is oriented to person, place, and time. She appears well-developed and well-nourished. No distress.  HENT:  Head: Normocephalic.  There are 2 small punctures to the occiput. There are no gaping lacerations in need of repair. The bleeding is controlled.  Neck: Normal range of motion. Neck supple.  Cardiovascular: Normal rate and regular rhythm.  Exam reveals no gallop and no friction rub.   No murmur heard. Pulmonary/Chest: Effort normal and breath sounds normal. No respiratory distress. She has no wheezes.  Abdominal: Soft. Bowel sounds are normal. She exhibits no distension. There is no tenderness.  Musculoskeletal: Normal range of motion.  Neurological: She is alert and oriented to person, place, and time.  Skin: Skin is warm and dry. She is not diaphoretic.    ED Course  Procedures (including critical care time) Labs Review Labs Reviewed  CBC  COMPREHENSIVE METABOLIC PANEL  URINALYSIS, ROUTINE W REFLEX MICROSCOPIC    Imaging Review No results found.   EKG Interpretation   Date/Time:  Saturday May 27 2014 13:03:20 EDT Ventricular Rate:  69 PR Interval:  120 QRS Duration: 104 QT Interval:  431 QTC Calculation: 462 R Axis:   84 Text Interpretation:  Sinus rhythm Borderline right axis deviation  Otherwise normal ECG  Confirmed by DELOS  MD, Leontine Radman ([id number]) on 05/27/2014  1:07:42 PM      MDM   Final diagnoses:  None    Patient presents after a fall at home. She struck the back of her head. She went to urgent care and was sent here for further workup due to high blood sugar.  She is neurologically intact and CT scan is unremarkable. From a head injury standpoint, I feel is a no further workup is indicated. She was found to have elevated blood sugar which was treated with normal saline and NovoLog. It is now 308 and improving. Laboratory studies are unremarkable I feel as though she is appropriate for discharge. She does have a urinary tract infection which will be treated with antibiotics.  I have advised her to keep a record  of her blood sugars for the next several days and follow up with her primary doctor next week to discuss these results.    Veryl Speak, MD 05/27/14 1415

## 2014-05-27 NOTE — ED Notes (Signed)
Pt placed on monitor upon arrival to room from radiology.

## 2014-06-19 ENCOUNTER — Other Ambulatory Visit: Payer: Self-pay

## 2014-06-19 ENCOUNTER — Other Ambulatory Visit: Payer: Self-pay | Admitting: Family Medicine

## 2014-06-19 ENCOUNTER — Other Ambulatory Visit: Payer: Self-pay | Admitting: Nurse Practitioner

## 2014-06-19 ENCOUNTER — Ambulatory Visit (INDEPENDENT_AMBULATORY_CARE_PROVIDER_SITE_OTHER): Payer: Medicare Other | Admitting: Podiatry

## 2014-06-19 DIAGNOSIS — Q828 Other specified congenital malformations of skin: Secondary | ICD-10-CM

## 2014-06-19 DIAGNOSIS — Z1231 Encounter for screening mammogram for malignant neoplasm of breast: Secondary | ICD-10-CM

## 2014-06-19 DIAGNOSIS — M810 Age-related osteoporosis without current pathological fracture: Secondary | ICD-10-CM

## 2014-06-19 NOTE — Progress Notes (Signed)
Patient ID: [name], female   DOB: May 05, 1946, 68 y.o.   MRN: [id number]  Subjective: Orientated x3 female presents complaining of very painful nucleated keratoses plantar left hallux  Objective: Nucleated hyperkeratotic lesion plantar left hallux  Assessment: Porokeratosis left hallux  Plan: The keratoses is debrided and packed with salinocaine  Reappoint at patient's request

## 2014-07-28 ENCOUNTER — Ambulatory Visit
Admission: RE | Admit: 2014-07-28 | Discharge: 2014-07-28 | Disposition: A | Discharge status: Home / Self Care | Payer: Medicare Other | Source: Ambulatory Visit | Attending: Nurse Practitioner | Admitting: Nurse Practitioner

## 2014-07-28 ENCOUNTER — Ambulatory Visit
Admission: RE | Admit: 2014-07-28 | Discharge: 2014-07-28 | Disposition: A | Discharge status: Home / Self Care | Payer: Medicare Other | Source: Ambulatory Visit

## 2014-07-28 DIAGNOSIS — M810 Age-related osteoporosis without current pathological fracture: Secondary | ICD-10-CM

## 2014-07-28 DIAGNOSIS — Z1231 Encounter for screening mammogram for malignant neoplasm of breast: Secondary | ICD-10-CM

## 2015-04-30 ENCOUNTER — Other Ambulatory Visit: Payer: Self-pay | Admitting: Anesthesiology

## 2015-04-30 DIAGNOSIS — M545 Low back pain: Secondary | ICD-10-CM

## 2015-05-11 ENCOUNTER — Ambulatory Visit
Admission: RE | Admit: 2015-05-11 | Discharge: 2015-05-11 | Disposition: A | Discharge status: Home / Self Care | Payer: Medicare Other | Source: Ambulatory Visit | Attending: Anesthesiology | Admitting: Anesthesiology

## 2015-05-11 DIAGNOSIS — M545 Low back pain: Secondary | ICD-10-CM

## 2015-05-11 MED ORDER — GADOBENATE DIMEGLUMINE 529 MG/ML IV SOLN
15.0000 mL | Freq: Once | INTRAVENOUS | Status: AC | PRN
  Administered 2015-05-11: 15 mL via INTRAVENOUS

## 2017-07-13 ENCOUNTER — Telehealth: Payer: Self-pay | Admitting: Cardiovascular Disease

## 2017-07-13 NOTE — Telephone Encounter (Signed)
Received records from [facility] for appointment on 07/31/17 with Dr Gwenlyn Found.  Records put with Dr Kennon Holter schedule for 07/31/17. lp

## 2017-07-31 ENCOUNTER — Encounter: Payer: Self-pay | Admitting: Cardiovascular Disease

## 2017-07-31 ENCOUNTER — Encounter (INDEPENDENT_AMBULATORY_CARE_PROVIDER_SITE_OTHER): Payer: Self-pay

## 2017-07-31 ENCOUNTER — Ambulatory Visit (INDEPENDENT_AMBULATORY_CARE_PROVIDER_SITE_OTHER): Payer: Medicare HMO | Admitting: Cardiovascular Disease

## 2017-07-31 VITALS — BP 142/69 | HR 66 | Ht 60.0 in | Wt 164.0 lb

## 2017-07-31 DIAGNOSIS — Z5181 Encounter for therapeutic drug level monitoring: Secondary | ICD-10-CM | POA: Diagnosis not present

## 2017-07-31 DIAGNOSIS — R600 Localized edema: Secondary | ICD-10-CM | POA: Diagnosis not present

## 2017-07-31 MED ORDER — POTASSIUM CHLORIDE CRYS ER 20 MEQ PO TBCR: 20.0000 meq | EXTENDED_RELEASE_TABLET | Freq: Every day | ORAL | 5 refills | Status: DC

## 2017-07-31 MED ORDER — HYDROCHLOROTHIAZIDE 12.5 MG PO TABS: 12.5000 mg | ORAL_TABLET | Freq: Every day | ORAL | 5 refills | Status: DC

## 2017-07-31 NOTE — Patient Instructions (Addendum)
Medication Instructions:  START POTASSIUM 20 MEQ DAILY   START HYDROCHLOROTHIAZIDE 12. 5 MG DAILY   Labwork: BMET TODAY   BMET IN 3 WEEKS  Testing/Procedures: Your physician has requested that you have an echocardiogram. Echocardiography is a painless test that uses sound waves to create images of your heart. It provides your doctor with information about the size and shape of your heart and how well your heart's chambers and valves are working. This procedure takes approximately one hour. There are no restrictions for this procedure. [place] [address]   Follow-Up: Your physician recommends that you schedule a follow-up appointment in: 2 MONTH OV  Any Other Special Instructions Will Be Listed Below (If Applicable).   Low-Sodium Eating Plan Sodium, which is an element that makes up salt, helps you maintain a healthy balance of fluids in your body. Too much sodium can increase your blood pressure and cause fluid and waste to be held in your body. Your health care provider or dietitian may recommend following this plan if you have high blood pressure (hypertension), kidney disease, liver disease, or heart failure. Eating less sodium can help lower your blood pressure, reduce swelling, and protect your heart, liver, and kidneys. What are tips for following this plan? General guidelines  Most people on this plan should limit their sodium intake to 1,500-2,000 mg (milligrams) of sodium each day. Reading food labels  The Nutrition Facts label lists the amount of sodium in one serving of the food. If you eat more than one serving, you must multiply the listed amount of sodium by the number of servings.  Choose foods with less than 140 mg of sodium per serving.  Avoid foods with 300 mg of sodium or more per serving. Shopping  Look for lower-sodium products, often labeled as "low-sodium" or "no salt added."  Always check the sodium content even if foods are  labeled as "unsalted" or "no salt added".  Buy fresh foods. ? Avoid canned foods and premade or frozen meals. ? Avoid canned, cured, or processed meats  Buy breads that have less than 80 mg of sodium per slice. Cooking  Eat more home-cooked food and less restaurant, buffet, and fast food.  Avoid adding salt when cooking. Use salt-free seasonings or herbs instead of table salt or sea salt. Check with your health care provider or pharmacist before using salt substitutes.  Cook with plant-based oils, such as canola, sunflower, or olive oil. Meal planning  When eating at a restaurant, ask that your food be prepared with less salt or no salt, if possible.  Avoid foods that contain MSG (monosodium glutamate). MSG is sometimes added to [place] food, bouillon, and some canned foods. What foods are recommended? The items listed may not be a complete list. Talk with your dietitian about what dietary choices are best for you. Grains Low-sodium cereals, including oats, puffed wheat and rice, and shredded wheat. Low-sodium crackers. Unsalted rice. Unsalted pasta. Low-sodium bread. Whole-grain breads and whole-grain pasta. Vegetables Fresh or frozen vegetables. "No salt added" canned vegetables. "No salt added" tomato sauce and paste. Low-sodium or reduced-sodium tomato and vegetable juice. Fruits Fresh, frozen, or canned fruit. Fruit juice. Meats and other protein foods Fresh or frozen (no salt added) meat, poultry, seafood, and fish. Low-sodium canned tuna and salmon. Unsalted nuts. Dried peas, beans, and lentils without added salt. Unsalted canned beans. Eggs. Unsalted nut butters. Dairy Milk. Soy milk. Cheese that is naturally low in sodium, such as ricotta  cheese, fresh mozzarella, or Swiss cheese Low-sodium or reduced-sodium cheese. Cream cheese. Yogurt. Fats and oils Unsalted butter. Unsalted margarine with no trans fat. Vegetable oils such as canola or olive oils. Seasonings and other  foods Fresh and dried herbs and spices. Salt-free seasonings. Low-sodium mustard and ketchup. Sodium-free salad dressing. Sodium-free light mayonnaise. Fresh or refrigerated horseradish. Lemon juice. Vinegar. Homemade, reduced-sodium, or low-sodium soups. Unsalted popcorn and pretzels. Low-salt or salt-free chips. What foods are not recommended? The items listed may not be a complete list. Talk with your dietitian about what dietary choices are best for you. Grains Instant hot cereals. Bread stuffing, pancake, and biscuit mixes. Croutons. Seasoned rice or pasta mixes. Noodle soup cups. Boxed or frozen macaroni and cheese. Regular salted crackers. Self-rising flour. Vegetables Sauerkraut, pickled vegetables, and relishes. Olives. [place] fries. Onion rings. Regular canned vegetables (not low-sodium or reduced-sodium). Regular canned tomato sauce and paste (not low-sodium or reduced-sodium). Regular tomato and vegetable juice (not low-sodium or reduced-sodium). Frozen vegetables in sauces. Meats and other protein foods Meat or fish that is salted, canned, smoked, spiced, or pickled. Bacon, ham, sausage, hotdogs, corned beef, chipped beef, packaged lunch meats, salt pork, jerky, pickled herring, anchovies, regular canned tuna, sardines, salted nuts. Dairy Processed cheese and cheese spreads. Cheese curds. Blue cheese. Feta cheese. String cheese. Regular cottage cheese. Buttermilk. Canned milk. Fats and oils Salted butter. Regular margarine. Ghee. Bacon fat. Seasonings and other foods Onion salt, garlic salt, seasoned salt, table salt, and sea salt. Canned and packaged gravies. Worcestershire sauce. Tartar sauce. Barbecue sauce. Teriyaki sauce. Soy sauce, including reduced-sodium. Steak sauce. Fish sauce. Oyster sauce. Cocktail sauce. Horseradish that you find on the shelf. Regular ketchup and mustard. Meat flavorings and tenderizers. Bouillon cubes. Hot sauce and Tabasco sauce. Premade or packaged  marinades. Premade or packaged taco seasonings. Relishes. Regular salad dressings. Salsa. Potato and tortilla chips. Corn chips and puffs. Salted popcorn and pretzels. Canned or dried soups. Pizza. Frozen entrees and pot pies. Summary  Eating less sodium can help lower your blood pressure, reduce swelling, and protect your heart, liver, and kidneys.  Most people on this plan should limit their sodium intake to 1,500-2,000 mg (milligrams) of sodium each day.  Canned, boxed, and frozen foods are high in sodium. Restaurant foods, fast foods, and pizza are also very high in sodium. You also get sodium by adding salt to food.  Try to cook at home, eat more fresh fruits and vegetables, and eat less fast food, canned, processed, or prepared foods. This information is not intended to replace advice given to you by your health care provider. Make sure you discuss any questions you have with your health care provider. Document Released: 04/25/2002 Document Revised: 10/27/2016 Document Reviewed: 10/27/2016 Elsevier Interactive Patient Education  2017 [place].   Echocardiogram An echocardiogram, or echocardiography, uses sound waves (ultrasound) to produce an image of your heart. The echocardiogram is simple, painless, obtained within a short period of time, and offers valuable information to your health care provider. The images from an echocardiogram can provide information such as:  Evidence of coronary artery disease (CAD).  Heart size.  Heart muscle function.  Heart valve function.  Aneurysm detection.  Evidence of a past heart attack.  Fluid buildup around the heart.  Heart muscle thickening.  Assess heart valve function.  Tell a health care provider about:  Any allergies you have.  All medicines you are taking, including vitamins, herbs, eye drops, creams, and over-the-counter medicines.  Any problems you  or family members have had with anesthetic medicines.  Any blood  disorders you have.  Any surgeries you have had.  Any medical conditions you have.  Whether you are pregnant or may be pregnant. What happens before the procedure? No special preparation is needed. Eat and drink normally. What happens during the procedure?  In order to produce an image of your heart, gel will be applied to your chest and a wand-like tool (transducer) will be moved over your chest. The gel will help transmit the sound waves from the transducer. The sound waves will harmlessly bounce off your heart to allow the heart images to be captured in real-time motion. These images will then be recorded.  You may need an IV to receive a medicine that improves the quality of the pictures. What happens after the procedure? You may return to your normal schedule including diet, activities, and medicines, unless your health care provider tells you otherwise. This information is not intended to replace advice given to you by your health care provider. Make sure you discuss any questions you have with your health care provider. Document Released: 10/31/2000 Document Revised: 06/21/2016 Document Reviewed: 07/11/2013 Elsevier Interactive Patient Education  2017 [employer].

## 2017-07-31 NOTE — Assessment & Plan Note (Signed)
History of hyperlipidemia on Lopid followed by her PCP

## 2017-07-31 NOTE — Addendum Note (Signed)
Addended by: Alvina Filbert B on: 07/31/2017 05:30 PM   Modules accepted: Orders

## 2017-07-31 NOTE — Assessment & Plan Note (Signed)
History of bilateral lower extremity edema fairly new in onset over the last one month left greater than right. She did have cellulitis treated with antibiotics. She said her legs were somewhat painful and "hot". She did complain of claudication however she has bounding pedal pulses. I am going to put her on a low-dose diuretic and obtain a 2-D echocardiogram. We talked about the importance of salt restriction.

## 2017-07-31 NOTE — Progress Notes (Signed)
07/31/2017 [name]   November 12, 1946  [id number]  Primary Physician Everardo Beals, NP Primary Cardiologist: [name] MD Lupe Carney, [place]  HPI:  [name] is a 71 y.o. female married, mother of 2 mother 3 grandchildren who is accompanied by her daughter came today her husband [name] is also patient of mine. She was referred to me by Yisroel Ramming PA-C for evaluation of an abnormal EKG and lower extremity edema. She has a history of diabetes and hyperlipidemia. She smoked for 40 years and quit 10 years ago. She's never had a heart attack or stroke. There is no family history. She's had lower extremity edema the last month left greater than right as well as some pain in both legs although she has had hip and knee replacements.   Current Meds  Medication Sig  . alendronate (FOSAMAX) 10 MG tablet Take 10 mg by mouth daily before breakfast. Take with a full glass of water on an empty stomach.   . diazepam (VALIUM) 5 MG tablet 1-2 tablets three times a day as needed for muscle spasms and cramps   . ergocalciferol (VITAMIN D2) 50000 UNITS capsule Take 50,000 Units by mouth once a week.    . gabapentin (NEURONTIN) 100 MG capsule Take 300 mg by mouth 2 (two) times daily.   Marland Kitchen gemfibrozil (LOPID) 600 MG tablet Take 600 mg by mouth 2 (two) times daily.    Marland Kitchen glipiZIDE-metformin (METAGLIP) 5-500 MG per tablet Take 1 tablet by mouth 2 (two) times daily before a meal.  . Insulin Glargine (LANTUS SOLOSTAR) 100 UNIT/ML Solostar Pen Inject into the skin daily at 10 pm. 38 UNITS AT NIGHT  . levothyroxine (SYNTHROID, LEVOTHROID) 125 MCG tablet TAKE ONE TABLET BY MOUTH EVERY DAY  . methocarbamol (ROBAXIN) 500 MG tablet TAKE ONE TABLET BY MOUTH EVERY 6 HOURS AS NEEDED FOR CRAMPS  . oxycodone (OXY-IR) 5 MG capsule Take 5 mg by mouth every 4 (four) hours as needed.  Marland Kitchen telmisartan-hydrochlorothiazide (MICARDIS HCT) 40-12.5 MG tablet Take 1 tablet by mouth daily.  . [DISCONTINUED]  Fluticasone-Salmeterol (ADVAIR DISKUS) 250-50 MCG/DOSE AEPB Inhale 1 puff into the lungs 2 (two) times daily. Rinse/gargle well after use.   . [DISCONTINUED] insulin aspart (NOVOLOG) 100 UNIT/ML injection Inject into the skin 3 (three) times daily before meals. Sliding scale     Allergies  Allergen Reactions  . Codeine     Social History   Social History  . Marital status: Married    Spouse name: N/A  . Number of children: N/A  . Years of education: N/A   Occupational History  . Disabled    Social History Main Topics  . Smoking status: Former Research scientist (life sciences)  . Smokeless tobacco: Never Used     Comment: 5 cigarettes a day  . Alcohol use No  . Drug use: No  . Sexual activity: Not on file   Other Topics Concern  . Not on file   Social History Narrative   Married and lives with husband   Has children   Housewife   disabled     Review of Systems: General: negative for chills, fever, night sweats or weight changes.  Cardiovascular: negative for chest pain, dyspnea on exertion, edema, orthopnea, palpitations, paroxysmal nocturnal dyspnea or shortness of breath Dermatological: negative for rash Respiratory: negative for cough or wheezing Urologic: negative for hematuria Abdominal: negative for nausea, vomiting, diarrhea, bright red blood per rectum, melena, or hematemesis Neurologic: negative for visual changes, syncope,  or dizziness All other systems reviewed and are otherwise negative except as noted above.    Blood pressure (!) 142/69, pulse 66, height 5' (1.524 m), weight 164 lb (74.4 kg).  General appearance: alert and no distress Neck: no adenopathy, no carotid bruit, no JVD, supple, symmetrical, trachea midline and thyroid not enlarged, symmetric, no tenderness/mass/nodules Lungs: clear to auscultation bilaterally Heart: regular rate and rhythm, S1, S2 normal, no murmur, click, rub or gallop Extremities: 1+ edema bilaterally Pulses: 2+ and symmetric Skin: Skin color,  texture, turgor normal. No rashes or lesions Neurologic: Alert and oriented X 3, normal strength and tone. Normal symmetric reflexes. Normal coordination and gait  EKG sinus rhythm at 68 with nonspecific ST-T wave changes. I personally reviewed this EKG.  ASSESSMENT AND PLAN:   HYPERLIPIDEMIA History of hyperlipidemia on Lopid followed by her PCP  Bilateral lower extremity edema History of bilateral lower extremity edema fairly new in onset over the last one month left greater than right. She did have cellulitis treated with antibiotics. She said her legs were somewhat painful and "hot". She did complain of claudication however she has bounding pedal pulses. I am going to put her on a low-dose diuretic and obtain a 2-D echocardiogram. We talked about the importance of salt restriction.      [name] MD FACP,FACC,FAHA, [facility] 07/31/2017 2:49 PM

## 2017-08-01 LAB — BASIC METABOLIC PANEL
BUN/Creatinine Ratio: 19 (ref 12–28)
BUN: 16 mg/dL (ref 8–27)
CO2: 22 mmol/L (ref 20–29)
Calcium: 9.4 mg/dL (ref 8.7–10.3)
Chloride: 106 mmol/L (ref 96–106)
Creatinine, Ser: 0.84 mg/dL (ref 0.57–1.00)
GFR calc Af Amer: 81 mL/min/{1.73_m2} (ref >59)
GFR calc non Af Amer: 70 mL/min/{1.73_m2} (ref >59)
Glucose: 83 mg/dL (ref 65–99)
Potassium: 3.9 mmol/L (ref 3.5–5.2)
Sodium: 147 mmol/L — ABNORMAL HIGH (ref 134–144)

## 2017-08-07 ENCOUNTER — Ambulatory Visit (HOSPITAL_COMMUNITY): Payer: Medicare HMO | Attending: Cardiology

## 2017-08-07 ENCOUNTER — Other Ambulatory Visit: Payer: Self-pay

## 2017-08-07 DIAGNOSIS — R600 Localized edema: Secondary | ICD-10-CM | POA: Diagnosis not present

## 2017-08-07 DIAGNOSIS — I5030 Unspecified diastolic (congestive) heart failure: Secondary | ICD-10-CM | POA: Diagnosis not present

## 2017-08-07 HISTORY — PX: TRANSTHORACIC ECHOCARDIOGRAM: SHX275

## 2017-08-07 LAB — ECHOCARDIOGRAM COMPLETE
Ao-asc: 33 cm
E decel time: 225 msec
E/e' ratio: 14.5
FS: 50 % — AB (ref 28–44)
IVS/LV PW RATIO, ED: 1.3
LA ID, A-P, ES: 36 mm
LA diam end sys: 36 mm
LA diam index: 2.09 cm/m2
LA vol A4C: 35 ml
LA vol index: 20.9 mL/m2
LA vol: 36 mL
LV E/e' medial: 14.5
LV E/e'average: 14.5
LV PW d: 9.3 mm — AB (ref 0.6–1.1)
LV e' LATERAL: 5.04 cm/s
LVOT SV: 46 mL
LVOT VTI: 22.7 cm
LVOT area: 2.01 cm2
LVOT diameter: 16 mm
LVOT peak grad rest: 5 mmHg
LVOT peak vel: 113 cm/s
MV Dec: 225
MV Peak grad: 2 mmHg
MV pk A vel: 87.4 m/s
MV pk E vel: 73.1 m/s
TDI e' lateral: 5.04
TDI e' medial: 6.14

## 2017-08-22 LAB — BASIC METABOLIC PANEL
BUN/Creatinine Ratio: 22 (ref 12–28)
BUN: 21 mg/dL (ref 8–27)
CO2: 21 mmol/L (ref 20–29)
Calcium: 9 mg/dL (ref 8.7–10.3)
Chloride: 101 mmol/L (ref 96–106)
Creatinine, Ser: 0.94 mg/dL (ref 0.57–1.00)
GFR calc Af Amer: 71 mL/min/{1.73_m2} (ref >59)
GFR calc non Af Amer: 61 mL/min/{1.73_m2} (ref >59)
Glucose: 225 mg/dL — ABNORMAL HIGH (ref 65–99)
Potassium: 4.1 mmol/L (ref 3.5–5.2)
Sodium: 144 mmol/L (ref 134–144)

## 2017-09-24 ENCOUNTER — Encounter: Payer: Self-pay | Admitting: Cardiovascular Disease

## 2017-10-02 ENCOUNTER — Ambulatory Visit: Payer: Medicare HMO | Admitting: Cardiovascular Disease

## 2017-10-02 ENCOUNTER — Encounter: Payer: Self-pay | Admitting: Cardiovascular Disease

## 2017-10-02 DIAGNOSIS — R600 Localized edema: Secondary | ICD-10-CM

## 2017-10-02 NOTE — Progress Notes (Signed)
[name] returns today for follow-up of her 2-D echo which was entirely normal. She was initially seen by me because of lower extremity edema which has resolved on hydrochlorothiazide. I will see her back in one year for follow-up.  [name], M.D., [place], [facility], [place] [address] [place], [place]  [id number]  [phone] 10/02/2017 3:58 PM

## 2017-10-02 NOTE — Assessment & Plan Note (Signed)
[name] returns today for follow-up of her 2-D echo which was entirely normal. She was initially seen by me because of lower extremity edema which has resolved on hydrochlorothiazide. I will see her back in one year for follow-up.

## 2017-10-02 NOTE — Patient Instructions (Signed)

## 2018-05-03 ENCOUNTER — Inpatient Hospital Stay (HOSPITAL_COMMUNITY)
Admission: EM | Admit: 2018-05-03 | Discharge: 2018-05-07 | DRG: 853 | Disposition: A | Discharge status: Home / Self Care | Payer: Medicare HMO | Attending: Internal Medicine | Admitting: Internal Medicine

## 2018-05-03 ENCOUNTER — Encounter (HOSPITAL_COMMUNITY): Payer: Self-pay

## 2018-05-03 ENCOUNTER — Emergency Department (HOSPITAL_COMMUNITY): Payer: Medicare HMO

## 2018-05-03 DIAGNOSIS — B370 Candidal stomatitis: Secondary | ICD-10-CM

## 2018-05-03 DIAGNOSIS — Z6830 Body mass index (BMI) 30.0-30.9, adult: Secondary | ICD-10-CM

## 2018-05-03 DIAGNOSIS — N132 Hydronephrosis with renal and ureteral calculous obstruction: Secondary | ICD-10-CM | POA: Diagnosis present

## 2018-05-03 DIAGNOSIS — G9341 Metabolic encephalopathy: Secondary | ICD-10-CM | POA: Diagnosis present

## 2018-05-03 DIAGNOSIS — E785 Hyperlipidemia, unspecified: Secondary | ICD-10-CM | POA: Diagnosis present

## 2018-05-03 DIAGNOSIS — A4151 Sepsis due to Escherichia coli [E. coli]: Secondary | ICD-10-CM | POA: Diagnosis not present

## 2018-05-03 DIAGNOSIS — Z87891 Personal history of nicotine dependence: Secondary | ICD-10-CM

## 2018-05-03 DIAGNOSIS — Z8543 Personal history of malignant neoplasm of ovary: Secondary | ICD-10-CM

## 2018-05-03 DIAGNOSIS — N12 Tubulo-interstitial nephritis, not specified as acute or chronic: Secondary | ICD-10-CM

## 2018-05-03 DIAGNOSIS — E1165 Type 2 diabetes mellitus with hyperglycemia: Secondary | ICD-10-CM | POA: Diagnosis present

## 2018-05-03 DIAGNOSIS — Z8349 Family history of other endocrine, nutritional and metabolic diseases: Secondary | ICD-10-CM

## 2018-05-03 DIAGNOSIS — E876 Hypokalemia: Secondary | ICD-10-CM | POA: Diagnosis present

## 2018-05-03 DIAGNOSIS — I10 Essential (primary) hypertension: Secondary | ICD-10-CM | POA: Diagnosis present

## 2018-05-03 DIAGNOSIS — N3289 Other specified disorders of bladder: Secondary | ICD-10-CM | POA: Diagnosis present

## 2018-05-03 DIAGNOSIS — Z794 Long term (current) use of insulin: Secondary | ICD-10-CM

## 2018-05-03 DIAGNOSIS — J189 Pneumonia, unspecified organism: Secondary | ICD-10-CM

## 2018-05-03 DIAGNOSIS — R1310 Dysphagia, unspecified: Secondary | ICD-10-CM | POA: Diagnosis present

## 2018-05-03 DIAGNOSIS — E872 Acidosis: Secondary | ICD-10-CM | POA: Diagnosis present

## 2018-05-03 DIAGNOSIS — Z825 Family history of asthma and other chronic lower respiratory diseases: Secondary | ICD-10-CM

## 2018-05-03 DIAGNOSIS — Z885 Allergy status to narcotic agent status: Secondary | ICD-10-CM

## 2018-05-03 DIAGNOSIS — R410 Disorientation, unspecified: Secondary | ICD-10-CM | POA: Diagnosis not present

## 2018-05-03 DIAGNOSIS — N179 Acute kidney failure, unspecified: Secondary | ICD-10-CM | POA: Diagnosis present

## 2018-05-03 DIAGNOSIS — R6520 Severe sepsis without septic shock: Secondary | ICD-10-CM | POA: Diagnosis present

## 2018-05-03 DIAGNOSIS — J449 Chronic obstructive pulmonary disease, unspecified: Secondary | ICD-10-CM | POA: Diagnosis present

## 2018-05-03 DIAGNOSIS — B961 Klebsiella pneumoniae [K. pneumoniae] as the cause of diseases classified elsewhere: Secondary | ICD-10-CM | POA: Diagnosis present

## 2018-05-03 DIAGNOSIS — E039 Hypothyroidism, unspecified: Secondary | ICD-10-CM | POA: Diagnosis present

## 2018-05-03 DIAGNOSIS — Z79890 Hormone replacement therapy (postmenopausal): Secondary | ICD-10-CM

## 2018-05-03 DIAGNOSIS — E669 Obesity, unspecified: Secondary | ICD-10-CM | POA: Diagnosis present

## 2018-05-03 DIAGNOSIS — E119 Type 2 diabetes mellitus without complications: Secondary | ICD-10-CM | POA: Diagnosis present

## 2018-05-03 DIAGNOSIS — A419 Sepsis, unspecified organism: Secondary | ICD-10-CM

## 2018-05-03 DIAGNOSIS — Z79899 Other long term (current) drug therapy: Secondary | ICD-10-CM

## 2018-05-03 DIAGNOSIS — K5090 Crohn's disease, unspecified, without complications: Secondary | ICD-10-CM | POA: Diagnosis present

## 2018-05-03 DIAGNOSIS — N390 Urinary tract infection, site not specified: Secondary | ICD-10-CM | POA: Diagnosis present

## 2018-05-03 DIAGNOSIS — Z8619 Personal history of other infectious and parasitic diseases: Secondary | ICD-10-CM

## 2018-05-03 HISTORY — DX: Candidal stomatitis: B37.0

## 2018-05-03 HISTORY — DX: Personal history of other infectious and parasitic diseases: Z86.19

## 2018-05-03 LAB — CBC WITH DIFFERENTIAL/PLATELET
Basophils Absolute: 0 10*3/uL (ref 0.0–0.1)
Basophils Relative: 0 %
Eosinophils Absolute: 0 10*3/uL (ref 0.0–0.7)
Eosinophils Relative: 0 %
HCT: 38.9 % (ref 36.0–46.0)
Hemoglobin: 12.1 g/dL (ref 12.0–15.0)
Lymphocytes Relative: 4 %
Lymphs Abs: 0.5 10*3/uL — ABNORMAL LOW (ref 0.7–4.0)
MCH: 28.7 pg (ref 26.0–34.0)
MCHC: 31.1 g/dL (ref 30.0–36.0)
MCV: 92.2 fL (ref 78.0–100.0)
Monocytes Absolute: 0.6 10*3/uL (ref 0.1–1.0)
Monocytes Relative: 5 %
Neutro Abs: 11.4 10*3/uL — ABNORMAL HIGH (ref 1.7–7.7)
Neutrophils Relative %: 91 %
Platelets: 154 10*3/uL (ref 150–400)
RBC: 4.22 MIL/uL (ref 3.87–5.11)
RDW: 13.4 % (ref 11.5–15.5)
WBC Morphology: INCREASED
WBC: 12.5 10*3/uL — ABNORMAL HIGH (ref 4.0–10.5)

## 2018-05-03 LAB — I-STAT CG4 LACTIC ACID, ED
Lactic Acid, Venous: 2.26 mmol/L (ref 0.5–1.9)
Lactic Acid, Venous: 4.82 mmol/L (ref 0.5–1.9)

## 2018-05-03 LAB — COMPREHENSIVE METABOLIC PANEL
ALT: 13 U/L — ABNORMAL LOW (ref 14–54)
AST: 27 U/L (ref 15–41)
Albumin: 3 g/dL — ABNORMAL LOW (ref 3.5–5.0)
Alkaline Phosphatase: 76 U/L (ref 38–126)
Anion gap: 15 (ref 5–15)
BUN: 18 mg/dL (ref 6–20)
CO2: 20 mmol/L — ABNORMAL LOW (ref 22–32)
Calcium: 8.3 mg/dL — ABNORMAL LOW (ref 8.9–10.3)
Chloride: 102 mmol/L (ref 101–111)
Creatinine, Ser: 1.29 mg/dL — ABNORMAL HIGH (ref 0.44–1.00)
GFR calc Af Amer: 47 mL/min — ABNORMAL LOW (ref >60)
GFR calc non Af Amer: 40 mL/min — ABNORMAL LOW (ref >60)
Glucose, Bld: 339 mg/dL — ABNORMAL HIGH (ref 65–99)
Potassium: 3.6 mmol/L (ref 3.5–5.1)
Sodium: 137 mmol/L (ref 135–145)
Total Bilirubin: 1 mg/dL (ref 0.3–1.2)
Total Protein: 6.3 g/dL — ABNORMAL LOW (ref 6.5–8.1)

## 2018-05-03 LAB — URINALYSIS, ROUTINE W REFLEX MICROSCOPIC
Bilirubin Urine: NEGATIVE
Glucose, UA: >=500 mg/dL — AB
Ketones, ur: 20 mg/dL — AB
Nitrite: POSITIVE — AB
Protein, ur: 100 mg/dL — AB
Specific Gravity, Urine: 1.013 (ref 1.005–1.030)
WBC, UA: >50 WBC/hpf — ABNORMAL HIGH (ref 0–5)
pH: 5 (ref 5.0–8.0)

## 2018-05-03 LAB — PROTIME-INR
INR: 1.37
Prothrombin Time: 16.7 seconds — ABNORMAL HIGH (ref 11.4–15.2)

## 2018-05-03 MED ORDER — SODIUM CHLORIDE 0.9 % IV SOLN
1.0000 g | Freq: Once | INTRAVENOUS | Status: AC
  Administered 2018-05-03: 1 g via INTRAVENOUS
  Filled 2018-05-03: qty 10

## 2018-05-03 MED ORDER — SODIUM CHLORIDE 0.9 % IV SOLN
500.0000 mg | Freq: Once | INTRAVENOUS | Status: AC
  Administered 2018-05-03: 500 mg via INTRAVENOUS
  Filled 2018-05-03: qty 500

## 2018-05-03 MED ORDER — ACETAMINOPHEN 325 MG PO TABS
650.0000 mg | ORAL_TABLET | Freq: Once | ORAL | Status: AC
Start: 2018-05-03 — End: 2018-05-03
  Administered 2018-05-03: 650 mg via ORAL
  Filled 2018-05-03: qty 2

## 2018-05-03 MED ORDER — ACETAMINOPHEN 650 MG RE SUPP: 650.0000 mg | Freq: Once | RECTAL | Status: DC

## 2018-05-03 MED ORDER — LACTATED RINGERS IV BOLUS
1000.0000 mL | Freq: Once | INTRAVENOUS | Status: AC
  Administered 2018-05-04: 400 mL via INTRAVENOUS
  Administered 2018-05-04: 02:00:00 via INTRAVENOUS

## 2018-05-03 MED ORDER — SODIUM CHLORIDE 0.9 % IV BOLUS
1000.0000 mL | Freq: Once | INTRAVENOUS | Status: AC
  Administered 2018-05-03: 1000 mL via INTRAVENOUS

## 2018-05-03 NOTE — ED Notes (Signed)
Patient transported to CT 

## 2018-05-03 NOTE — ED Notes (Signed)
Dr. Ashok Cordia notified of elevated CG-4 of 4.82

## 2018-05-03 NOTE — ED Triage Notes (Signed)
Pt BIB [facility] for eval of altered mental status. Pt is febrile to 103 on arrival, A&Ox3- unsure of date. Pt incontinent of foul smelling urine and stool on arrival. Endorses L sided flank pain, abd pain and headache

## 2018-05-03 NOTE — ED Notes (Signed)
Contact for family  Daughter : [name] ; [phone] on the way the be with Pt. Son : Eleanor Gatliff ;[phone] left to go home.

## 2018-05-03 NOTE — ED Notes (Signed)
Attempted IV access/blood draw x 2 w/out success.

## 2018-05-03 NOTE — ED Provider Notes (Signed)
[facility] EMERGENCY DEPARTMENT Provider Note   CSN: [id number] Arrival date & time: 05/03/18  2107  History   Chief Complaint Chief Complaint  Patient presents with  . Altered Mental Status    HPI ANICA ALCARAZ is a 72 y.o. female.  The history is provided by the patient.  Altered Mental Status   This is a new problem. The current episode started 12 to 24 hours ago. The problem has been gradually worsening. Associated symptoms include confusion and weakness (generalized). Pertinent negatives include no seizures. Associated symptoms comments: Fever, malodorous urine, back pain, abdominal pain, decreased PO intake. Risk factors include a recent infection. Past medical history comments: T2DM.    Past Medical History:  Diagnosis Date  . ADENOCARCINOMA, OVARY 02/12/2010  . Bronchitis   . C O P D 10/20/2008  . CROHN'S DISEASE 10/20/2008  . DIABETES, TYPE 2 10/20/2008  . EMPHYSEMA 10/20/2008  . HYPERLIPIDEMIA 10/20/2008  . HYPOTHYROIDISM, POST-RADIATION 06/06/2010  . MUSCLE CRAMPS 09/19/2010    Patient Active Problem List   Diagnosis Date Noted  . Sepsis ([place]) 05/04/2018  . Acute lower UTI 05/04/2018  . Bilateral lower extremity edema 07/31/2017  . MUSCLE CRAMPS 09/19/2010  . NUMBNESS 09/19/2010  . HYPOTHYROIDISM, POST-RADIATION 06/06/2010  . ADENOCARCINOMA, OVARY 02/12/2010  . THRUSH 11/29/2008  . DIABETES, TYPE 2 10/20/2008  . HYPERLIPIDEMIA 10/20/2008  . BRONCHITIS, ACUTE WITH BRONCHOSPASM 10/20/2008  . EMPHYSEMA 10/20/2008  . C O P D 10/20/2008  . CROHN'S DISEASE 10/20/2008  . TOBACCO ABUSE-HISTORY OF 10/20/2008    Past Surgical History:  Procedure Laterality Date  . APPENDECTOMY    . EYE SURGERY    . JOINT REPLACEMENT    . OVARY SURGERY     radiation rods used     OB History   None      Home Medications    Prior to Admission medications   Medication Sig Start Date End Date Taking? Authorizing Provider  alendronate (FOSAMAX) 10 MG  tablet Take 10 mg by mouth daily before breakfast. Take with a full glass of water on an empty stomach.     [provider]  diazepam (VALIUM) 5 MG tablet 1-2 tablets three times a day as needed for muscle spasms and cramps     [provider]  ergocalciferol (VITAMIN D2) 50000 UNITS capsule Take 50,000 Units by mouth once a week.      [provider]  gabapentin (NEURONTIN) 100 MG capsule Take 300 mg by mouth 2 (two) times daily.     [provider]  gemfibrozil (LOPID) 600 MG tablet Take 600 mg by mouth 2 (two) times daily.      [provider]  glipiZIDE-metformin (METAGLIP) 5-500 MG per tablet Take 1 tablet by mouth 2 (two) times daily before a meal.    [provider]  hydrochlorothiazide (HYDRODIURIL) 12.5 MG tablet Take 1 tablet (12.5 mg total) by mouth daily. 07/31/17   [name], MD  Insulin Glargine (LANTUS SOLOSTAR) 100 UNIT/ML Solostar Pen Inject into the skin daily at 10 pm. 38 UNITS AT NIGHT    [provider]  levothyroxine (SYNTHROID, LEVOTHROID) 125 MCG tablet TAKE ONE TABLET BY MOUTH EVERY DAY 06/11/11   Renato Shin, MD  methocarbamol (ROBAXIN) 500 MG tablet TAKE ONE TABLET BY MOUTH EVERY 6 HOURS AS NEEDED FOR CRAMPS 05/10/11   Renato Shin, MD  oxycodone (OXY-IR) 5 MG capsule Take 5 mg by mouth every 4 (four) hours as needed.  [provider]  potassium chloride SA (K-DUR,KLOR-CON) 20 MEQ tablet Take 1 tablet (20 mEq total) by mouth daily. 07/31/17   [name], MD  telmisartan-hydrochlorothiazide (MICARDIS HCT) 40-12.5 MG tablet Take 1 tablet by mouth daily.    [provider]    Family History Family History  Problem Relation Age of Onset  . Emphysema Father   . Emphysema Sister   . Emphysema Brother   . Cancer Mother        Skin Cancer  . Thyroid disease Mother        uncertain type     Social History Social History   Tobacco Use  . Smoking status: Former Research scientist (life sciences)  .  Smokeless tobacco: Never Used  . Tobacco comment: 5 cigarettes a day  Substance Use Topics  . Alcohol use: No  . Drug use: No     Allergies   Codeine and Oxycodone hcl   Review of Systems Review of Systems  Constitutional: Positive for appetite change and fever. Negative for chills.  HENT: Negative for ear pain and sore throat.   Eyes: Negative for pain and visual disturbance.  Respiratory: Negative for cough and shortness of breath.   Cardiovascular: Negative for chest pain and palpitations.  Gastrointestinal: Positive for abdominal pain. Negative for vomiting.  Genitourinary: Negative for dysuria and hematuria.       Malodorous urine  Musculoskeletal: Positive for back pain. Negative for arthralgias.  Skin: Negative for color change and rash.  Neurological: Positive for weakness (generalized). Negative for seizures and syncope.  Psychiatric/Behavioral: Positive for confusion.  All other systems reviewed and are negative.    Physical Exam Updated Vital Signs BP (!) 118/31   Pulse 97   Temp (!) 103 F (39.4 C) (Oral)   Resp (!) 26   Ht 5\' 2"  (1.575 m)   Wt 72.6 kg (160 lb)   SpO2 96%   BMI 29.26 kg/m   Physical Exam  Constitutional: She appears well-developed and well-nourished. No distress.  HENT:  Head: Normocephalic and atraumatic.  Mouth/Throat: Oropharynx is clear and moist.  Eyes: Conjunctivae and EOM are normal.  Neck: Normal range of motion. Neck supple.  No meningismus   Cardiovascular: Normal rate and regular rhythm.  No murmur heard. Pulmonary/Chest: Effort normal and breath sounds normal. No stridor. No respiratory distress.  Abdominal: Soft. She exhibits no distension. There is tenderness in the suprapubic area. There is CVA tenderness (bilateral). There is no guarding.  Musculoskeletal: She exhibits no edema.  Neurological: She is alert.  Eyes open, oriented to person only, no facial asymmetry, intact grip strength bilaterally, follows most  commands, incoherent answers to most questions  Skin: Skin is warm and dry.  Nursing note and vitals reviewed.    ED Treatments / Results  Labs (all labs ordered are listed, but only abnormal results are displayed) Labs Reviewed  COMPREHENSIVE METABOLIC PANEL - Abnormal; Notable for the following components:      Result Value   CO2 20 (*)    Glucose, Bld 339 (*)    Creatinine, Ser 1.29 (*)    Calcium 8.3 (*)    Total Protein 6.3 (*)    Albumin 3.0 (*)    ALT 13 (*)    GFR calc non Af Amer 40 (*)    GFR calc Af Amer 47 (*)    All other components within normal limits  CBC WITH DIFFERENTIAL/PLATELET - Abnormal; Notable for the following components:   WBC 12.5 (*)  Neutro Abs 11.4 (*)    Lymphs Abs 0.5 (*)    All other components within normal limits  PROTIME-INR - Abnormal; Notable for the following components:   Prothrombin Time 16.7 (*)    All other components within normal limits  URINALYSIS, ROUTINE W REFLEX MICROSCOPIC - Abnormal; Notable for the following components:   APPearance CLOUDY (*)    Glucose, UA >=500 (*)    Hgb urine dipstick LARGE (*)    Ketones, ur 20 (*)    Protein, ur 100 (*)    Nitrite POSITIVE (*)    Leukocytes, UA LARGE (*)    WBC, UA >50 (*)    Bacteria, UA MANY (*)    All other components within normal limits  I-STAT CG4 LACTIC ACID, ED - Abnormal; Notable for the following components:   Lactic Acid, Venous 4.82 (*)    All other components within normal limits  I-STAT CG4 LACTIC ACID, ED - Abnormal; Notable for the following components:   Lactic Acid, Venous 2.26 (*)    All other components within normal limits  CULTURE, BLOOD (ROUTINE X 2)  CULTURE, BLOOD (ROUTINE X 2)  URINE CULTURE    EKG None  Radiology Ct Abdomen Pelvis Wo Contrast  Result Date: 05/03/2018 CLINICAL DATA:  LEFT flank pain, abdominal pain. History of Crohn's disease, ovarian cancer, appendectomy. EXAM: CT ABDOMEN AND PELVIS WITHOUT CONTRAST TECHNIQUE:  Multidetector CT imaging of the abdomen and pelvis was performed following the standard protocol without IV contrast. COMPARISON:  CT abdomen and pelvis April 22, 2014 FINDINGS: LOWER CHEST: Trace pleural effusion versus pleural thickening. Included heart size is normal. Minimal pericardial thickening. HEPATOBILIARY: Normal. PANCREAS: Normal. SPLEEN: Normal. ADRENALS/URINARY TRACT: Kidneys are orthotopic, demonstrating normal size and morphology. RIGHT cortical scarring, unchanged. Moderate RIGHT hydroureteronephrosis to the level of the distal ureter where a 6 mm calculus is present. Limited assessment for renal masses by nonenhanced CT. Urinary bladder is partially distended with subcentimeter dependent calculi. Normal adrenal glands. STOMACH/BOWEL: The stomach, small and large bowel are normal in course and caliber without inflammatory changes, sensitivity decreased by lack of enteric contrast. Mild amount of retained large bowel stool. A few scattered colonic diverticula present VASCULAR/LYMPHATIC: Aortoiliac vessels are normal in course and caliber. No lymphadenopathy by CT size criteria. REPRODUCTIVE: Normal. OTHER: Minimal RIGHT retroperitoneal effusion in fat stranding. No intraperitoneal free fluid. MUSCULOSKELETAL: Non-acute. Streak artifact from LEFT total arthroplasty. Asymmetrically atrophic LEFT ileo psoas muscle. Minimal grade 1 L4-5 anterolisthesis. Severe L4-5 facet arthropathy. No spondylolysis. IMPRESSION: 1. 6 mm distal RIGHT ureteral calculus resulting in moderate obstructive uropathy. 2. Subcentimeter bladder calculi. Electronically Signed   By: Elon Alas M.D.   On: 05/03/2018 23:49   Dg Chest 2 View  Result Date: 05/03/2018 CLINICAL DATA:  Altered mental status.  Fever.  History of COPD. EXAM: CHEST - 2 VIEW COMPARISON:  None. FINDINGS: Cardiac silhouette is mildly enlarged, mediastinal silhouette is not suspicious, calcified aortic arch. Mild interstitial prominence. Patchy RIGHT  peripheral airspace opacity. No pleural effusion. Biapical pleural thickening. No pneumothorax. Soft tissue planes and included osseous structures are non suspicious, mild degenerative change of the thoracic spine. IMPRESSION: Mild cardiomegaly. Mild interstitial prominence seen with pulmonary edema or atypical infection. RIGHT pneumonia, confluent edema or pleural thickening. Aortic Atherosclerosis (ICD10-I70.0). Electronically Signed   By: Elon Alas M.D.   On: 05/03/2018 22:14    Procedures Procedures (including critical care time)  Medications Ordered in ED Medications  lactated ringers bolus 1,000 mL (0 mLs Intravenous  Hold 05/03/18 2200)  cefTRIAXone (ROCEPHIN) 1 g in sodium chloride 0.9 % 100 mL IVPB (0 g Intravenous Stopped 05/03/18 2258)  sodium chloride 0.9 % bolus 1,000 mL (0 mLs Intravenous Stopped 05/03/18 2258)  acetaminophen (TYLENOL) tablet 650 mg (650 mg Oral Given 05/03/18 2219)  azithromycin (ZITHROMAX) 500 mg in sodium chloride 0.9 % 250 mL IVPB (500 mg Intravenous New Bag/Given 05/03/18 2310)     Initial Impression / Assessment and Plan / ED Course  I have reviewed the triage vital signs and the nursing notes.  Pertinent labs & imaging results that were available during my care of the patient were reviewed by me and considered in my medical decision making (see chart for details).     DAYLAN BOGGESS is a 72 y.o. female with PMHx of T2DM who p/w AMS, fever. Reviewed and confirmed nursing documentation for past medical history, family history, social history. VS T103, BP wnl. Exam remarkable for delirious with no focal neuro deficits, b/l CVA/suprapubic tenderness. Ddx includes delirium, sepsis secondary to pyelonephritis. No concern for meningitis. Also considering PNA.   1L LR, 1L NS bolus given. CBC with hyperglycemia 339, AKI with Cr 1.29, AG wnl. CBC with leukocytosis 12.5. Lactic acidosis 4.82. UA with large leuks, positive nitrites, >50 WBCs c/w UTI. IV  ceftriaxone given. CXR also concerning for R PNA, added IV azithromycin. Blood, urine cultures pending.   Admitted to hospitalist. Per request, will obtain CT abd/pelvis w/o contrast to r/o obstructing etiology.   Old records reviewed. Labs reviewed by me and used in the medical decision making.  Imaging viewed and interpreted by me and used in the medical decision making (formal interpretation from radiologist).    Final Clinical Impressions(s) / ED Diagnoses   Final diagnoses:  Pyelonephritis  Community acquired pneumonia, unspecified laterality  Delirium  AKI (acute kidney injury) [facility])    ED Discharge Orders    None       Norm Salt, MD 05/04/18 Shelah Lewandowsky    Lajean Saver, MD 05/06/18 0962    Lajean Saver, MD 06/12/18 1451

## 2018-05-04 ENCOUNTER — Inpatient Hospital Stay (HOSPITAL_COMMUNITY): Payer: Medicare HMO | Admitting: Certified Registered Nurse Anesthetist

## 2018-05-04 ENCOUNTER — Inpatient Hospital Stay (HOSPITAL_COMMUNITY): Payer: Medicare HMO

## 2018-05-04 ENCOUNTER — Encounter (HOSPITAL_COMMUNITY)
Admission: EM | Disposition: A | Discharge status: Home / Self Care | Payer: Self-pay | Source: Home / Self Care | Attending: Internal Medicine

## 2018-05-04 ENCOUNTER — Other Ambulatory Visit: Payer: Self-pay

## 2018-05-04 ENCOUNTER — Encounter (HOSPITAL_COMMUNITY): Payer: Self-pay | Admitting: Internal Medicine

## 2018-05-04 DIAGNOSIS — Z8349 Family history of other endocrine, nutritional and metabolic diseases: Secondary | ICD-10-CM | POA: Diagnosis not present

## 2018-05-04 DIAGNOSIS — J449 Chronic obstructive pulmonary disease, unspecified: Secondary | ICD-10-CM | POA: Diagnosis present

## 2018-05-04 DIAGNOSIS — B961 Klebsiella pneumoniae [K. pneumoniae] as the cause of diseases classified elsewhere: Secondary | ICD-10-CM | POA: Diagnosis present

## 2018-05-04 DIAGNOSIS — E119 Type 2 diabetes mellitus without complications: Secondary | ICD-10-CM | POA: Diagnosis present

## 2018-05-04 DIAGNOSIS — Z79890 Hormone replacement therapy (postmenopausal): Secondary | ICD-10-CM | POA: Diagnosis not present

## 2018-05-04 DIAGNOSIS — G9341 Metabolic encephalopathy: Secondary | ICD-10-CM | POA: Diagnosis present

## 2018-05-04 DIAGNOSIS — E785 Hyperlipidemia, unspecified: Secondary | ICD-10-CM | POA: Diagnosis present

## 2018-05-04 DIAGNOSIS — I10 Essential (primary) hypertension: Secondary | ICD-10-CM | POA: Diagnosis present

## 2018-05-04 DIAGNOSIS — E039 Hypothyroidism, unspecified: Secondary | ICD-10-CM | POA: Diagnosis present

## 2018-05-04 DIAGNOSIS — N3289 Other specified disorders of bladder: Secondary | ICD-10-CM | POA: Diagnosis present

## 2018-05-04 DIAGNOSIS — E872 Acidosis: Secondary | ICD-10-CM | POA: Diagnosis present

## 2018-05-04 DIAGNOSIS — E876 Hypokalemia: Secondary | ICD-10-CM | POA: Diagnosis present

## 2018-05-04 DIAGNOSIS — N390 Urinary tract infection, site not specified: Secondary | ICD-10-CM | POA: Diagnosis not present

## 2018-05-04 DIAGNOSIS — Z8543 Personal history of malignant neoplasm of ovary: Secondary | ICD-10-CM | POA: Diagnosis not present

## 2018-05-04 DIAGNOSIS — Z87891 Personal history of nicotine dependence: Secondary | ICD-10-CM | POA: Diagnosis not present

## 2018-05-04 DIAGNOSIS — A419 Sepsis, unspecified organism: Secondary | ICD-10-CM | POA: Diagnosis not present

## 2018-05-04 DIAGNOSIS — R1310 Dysphagia, unspecified: Secondary | ICD-10-CM | POA: Diagnosis present

## 2018-05-04 DIAGNOSIS — N179 Acute kidney failure, unspecified: Secondary | ICD-10-CM | POA: Diagnosis present

## 2018-05-04 DIAGNOSIS — R6520 Severe sepsis without septic shock: Secondary | ICD-10-CM | POA: Diagnosis present

## 2018-05-04 DIAGNOSIS — E669 Obesity, unspecified: Secondary | ICD-10-CM | POA: Diagnosis present

## 2018-05-04 DIAGNOSIS — Z825 Family history of asthma and other chronic lower respiratory diseases: Secondary | ICD-10-CM | POA: Diagnosis not present

## 2018-05-04 DIAGNOSIS — E1165 Type 2 diabetes mellitus with hyperglycemia: Secondary | ICD-10-CM | POA: Diagnosis present

## 2018-05-04 DIAGNOSIS — N132 Hydronephrosis with renal and ureteral calculous obstruction: Secondary | ICD-10-CM | POA: Diagnosis present

## 2018-05-04 DIAGNOSIS — R410 Disorientation, unspecified: Secondary | ICD-10-CM | POA: Diagnosis present

## 2018-05-04 DIAGNOSIS — A4151 Sepsis due to Escherichia coli [E. coli]: Secondary | ICD-10-CM | POA: Diagnosis present

## 2018-05-04 DIAGNOSIS — K5090 Crohn's disease, unspecified, without complications: Secondary | ICD-10-CM | POA: Diagnosis present

## 2018-05-04 DIAGNOSIS — Z79899 Other long term (current) drug therapy: Secondary | ICD-10-CM | POA: Diagnosis not present

## 2018-05-04 DIAGNOSIS — Z794 Long term (current) use of insulin: Secondary | ICD-10-CM | POA: Diagnosis not present

## 2018-05-04 HISTORY — PX: CYSTOSCOPY WITH STENT PLACEMENT: SHX5790

## 2018-05-04 LAB — COMPREHENSIVE METABOLIC PANEL
ALT: 12 U/L — ABNORMAL LOW (ref 14–54)
AST: 24 U/L (ref 15–41)
Albumin: 2.5 g/dL — ABNORMAL LOW (ref 3.5–5.0)
Alkaline Phosphatase: 63 U/L (ref 38–126)
Anion gap: 11 (ref 5–15)
BUN: 26 mg/dL — ABNORMAL HIGH (ref 6–20)
CO2: 21 mmol/L — ABNORMAL LOW (ref 22–32)
Calcium: 7.2 mg/dL — ABNORMAL LOW (ref 8.9–10.3)
Chloride: 105 mmol/L (ref 101–111)
Creatinine, Ser: 1.35 mg/dL — ABNORMAL HIGH (ref 0.44–1.00)
GFR calc Af Amer: 44 mL/min — ABNORMAL LOW (ref >60)
GFR calc non Af Amer: 38 mL/min — ABNORMAL LOW (ref >60)
Glucose, Bld: 293 mg/dL — ABNORMAL HIGH (ref 65–99)
Potassium: 3.3 mmol/L — ABNORMAL LOW (ref 3.5–5.1)
Sodium: 137 mmol/L (ref 135–145)
Total Bilirubin: 0.7 mg/dL (ref 0.3–1.2)
Total Protein: 5.4 g/dL — ABNORMAL LOW (ref 6.5–8.1)

## 2018-05-04 LAB — CBC WITH DIFFERENTIAL/PLATELET
Basophils Absolute: 0 10*3/uL (ref 0.0–0.1)
Basophils Relative: 0 %
Eosinophils Absolute: 0 10*3/uL (ref 0.0–0.7)
Eosinophils Relative: 0 %
HCT: 31.7 % — ABNORMAL LOW (ref 36.0–46.0)
Hemoglobin: 10.5 g/dL — ABNORMAL LOW (ref 12.0–15.0)
Lymphocytes Relative: 5 %
Lymphs Abs: 0.6 10*3/uL — ABNORMAL LOW (ref 0.7–4.0)
MCH: 29.6 pg (ref 26.0–34.0)
MCHC: 33.1 g/dL (ref 30.0–36.0)
MCV: 89.3 fL (ref 78.0–100.0)
Monocytes Absolute: 0.8 10*3/uL (ref 0.1–1.0)
Monocytes Relative: 6 %
Neutro Abs: 12.2 10*3/uL — ABNORMAL HIGH (ref 1.7–7.7)
Neutrophils Relative %: 89 %
Platelets: 134 10*3/uL — ABNORMAL LOW (ref 150–400)
RBC: 3.55 MIL/uL — ABNORMAL LOW (ref 3.87–5.11)
RDW: 13.6 % (ref 11.5–15.5)
WBC: 13.6 10*3/uL — ABNORMAL HIGH (ref 4.0–10.5)

## 2018-05-04 LAB — BLOOD CULTURE ID PANEL (REFLEXED)

## 2018-05-04 LAB — PROTIME-INR
INR: 1.49
Prothrombin Time: 17.9 seconds — ABNORMAL HIGH (ref 11.4–15.2)

## 2018-05-04 LAB — APTT: aPTT: 38 seconds — ABNORMAL HIGH (ref 24–36)

## 2018-05-04 LAB — GLUCOSE, CAPILLARY
Glucose-Capillary: 234 mg/dL — ABNORMAL HIGH (ref 65–99)
Glucose-Capillary: 270 mg/dL — ABNORMAL HIGH (ref 65–99)
Glucose-Capillary: 248 mg/dL — ABNORMAL HIGH (ref 65–99)
Glucose-Capillary: 398 mg/dL — ABNORMAL HIGH (ref 65–99)
Glucose-Capillary: 341 mg/dL — ABNORMAL HIGH (ref 65–99)
Glucose-Capillary: 318 mg/dL — ABNORMAL HIGH (ref 65–99)

## 2018-05-04 LAB — T4, FREE: Free T4: 0.99 ng/dL (ref 0.82–1.77)

## 2018-05-04 LAB — MRSA PCR SCREENING: MRSA by PCR: NEGATIVE

## 2018-05-04 LAB — LACTIC ACID, PLASMA
Lactic Acid, Venous: 2.8 mmol/L (ref 0.5–1.9)
Lactic Acid, Venous: 1.8 mmol/L (ref 0.5–1.9)

## 2018-05-04 LAB — TSH: TSH: 1.917 u[IU]/mL (ref 0.350–4.500)

## 2018-05-04 LAB — HEMOGLOBIN A1C
Hgb A1c MFr Bld: 9.4 % — ABNORMAL HIGH (ref 4.8–5.6)
Mean Plasma Glucose: 223.08 mg/dL

## 2018-05-04 LAB — CBG MONITORING, ED: Glucose-Capillary: 323 mg/dL — ABNORMAL HIGH (ref 65–99)

## 2018-05-04 LAB — PROCALCITONIN: Procalcitonin: 64.44 ng/mL

## 2018-05-04 SURGERY — CYSTOSCOPY, WITH STENT INSERTION
Anesthesia: General | Laterality: Right

## 2018-05-04 MED ORDER — FENTANYL CITRATE (PF) 100 MCG/2ML IJ SOLN
25.0000 ug | INTRAMUSCULAR | Status: DC | PRN
  Administered 2018-05-04: 50 ug via INTRAVENOUS

## 2018-05-04 MED ORDER — INSULIN ASPART 100 UNIT/ML ~~LOC~~ SOLN: 0.0000 [IU] | Freq: Every day | SUBCUTANEOUS | Status: DC

## 2018-05-04 MED ORDER — OXYCODONE HCL 5 MG PO TABS
5.0000 mg | ORAL_TABLET | Freq: Three times a day (TID) | ORAL | Status: DC | PRN
  Administered 2018-05-04 – 2018-05-07 (×5): 5 mg via ORAL
  Filled 2018-05-04 (×5): qty 1

## 2018-05-04 MED ORDER — ACETAMINOPHEN 650 MG RE SUPP
650.0000 mg | Freq: Once | RECTAL | Status: AC
  Administered 2018-05-04: 650 mg via RECTAL

## 2018-05-04 MED ORDER — MEPERIDINE HCL 50 MG/ML IJ SOLN: 6.2500 mg | INTRAMUSCULAR | Status: DC | PRN

## 2018-05-04 MED ORDER — SODIUM CHLORIDE 0.9 % IV SOLN
INTRAVENOUS | Status: AC
  Administered 2018-05-04 – 2018-05-05 (×4): via INTRAVENOUS

## 2018-05-04 MED ORDER — SODIUM CHLORIDE 0.9 % IV SOLN
1.0000 g | Freq: Once | INTRAVENOUS | Status: AC
  Administered 2018-05-04: 1 g via INTRAVENOUS
  Filled 2018-05-04: qty 10

## 2018-05-04 MED ORDER — LIDOCAINE 2% (20 MG/ML) 5 ML SYRINGE
INTRAMUSCULAR | Status: DC | PRN
  Administered 2018-05-04: 100 mg via INTRAVENOUS

## 2018-05-04 MED ORDER — SODIUM CHLORIDE 0.9 % IV SOLN
2.0000 g | Freq: Two times a day (BID) | INTRAVENOUS | Status: DC
  Administered 2018-05-04 – 2018-05-05 (×2): 2 g via INTRAVENOUS
  Filled 2018-05-04 (×2): qty 2

## 2018-05-04 MED ORDER — ACETAMINOPHEN 650 MG RE SUPP
650.0000 mg | Freq: Four times a day (QID) | RECTAL | Status: DC | PRN
  Administered 2018-05-04 (×2): 650 mg via RECTAL
  Filled 2018-05-04 (×3): qty 1

## 2018-05-04 MED ORDER — OXYBUTYNIN CHLORIDE 5 MG PO TABS: 5.0000 mg | ORAL_TABLET | Freq: Three times a day (TID) | ORAL | 1 refills | Status: DC | PRN

## 2018-05-04 MED ORDER — MORPHINE SULFATE (PF) 4 MG/ML IV SOLN
1.0000 mg | Freq: Once | INTRAVENOUS | Status: AC
  Administered 2018-05-04: 1 mg via INTRAVENOUS
  Filled 2018-05-04: qty 1

## 2018-05-04 MED ORDER — LIDOCAINE 2% (20 MG/ML) 5 ML SYRINGE
INTRAMUSCULAR | Status: AC
  Filled 2018-05-04: qty 5

## 2018-05-04 MED ORDER — ONDANSETRON HCL 4 MG PO TABS: 4.0000 mg | ORAL_TABLET | Freq: Four times a day (QID) | ORAL | Status: DC | PRN

## 2018-05-04 MED ORDER — LEVOTHYROXINE SODIUM 25 MCG PO TABS
125.0000 ug | ORAL_TABLET | ORAL | Status: DC
  Administered 2018-05-06 – 2018-05-07 (×2): 125 ug via ORAL
  Filled 2018-05-04 (×3): qty 1

## 2018-05-04 MED ORDER — PROPOFOL 10 MG/ML IV BOLUS
INTRAVENOUS | Status: AC
  Filled 2018-05-04: qty 20

## 2018-05-04 MED ORDER — INSULIN ASPART 100 UNIT/ML ~~LOC~~ SOLN
0.0000 [IU] | SUBCUTANEOUS | Status: DC
  Administered 2018-05-04: 7 [IU] via SUBCUTANEOUS
  Administered 2018-05-04: 3 [IU] via SUBCUTANEOUS
  Filled 2018-05-04: qty 1

## 2018-05-04 MED ORDER — ACETAMINOPHEN 325 MG PO TABS
650.0000 mg | ORAL_TABLET | Freq: Four times a day (QID) | ORAL | Status: DC | PRN
  Administered 2018-05-05 – 2018-05-07 (×2): 650 mg via ORAL
  Filled 2018-05-04 (×3): qty 2

## 2018-05-04 MED ORDER — ONDANSETRON HCL 4 MG/2ML IJ SOLN
INTRAMUSCULAR | Status: AC
  Filled 2018-05-04: qty 2

## 2018-05-04 MED ORDER — ONDANSETRON HCL 4 MG/2ML IJ SOLN
4.0000 mg | Freq: Four times a day (QID) | INTRAMUSCULAR | Status: DC | PRN
  Administered 2018-05-04 – 2018-05-07 (×2): 4 mg via INTRAVENOUS
  Filled 2018-05-04 (×3): qty 2

## 2018-05-04 MED ORDER — AZITHROMYCIN 500 MG IV SOLR
500.0000 mg | INTRAVENOUS | Status: DC
  Administered 2018-05-04: 500 mg via INTRAVENOUS
  Filled 2018-05-04: qty 500

## 2018-05-04 MED ORDER — PROPOFOL 10 MG/ML IV BOLUS
INTRAVENOUS | Status: DC | PRN
  Administered 2018-05-04: 100 mg via INTRAVENOUS

## 2018-05-04 MED ORDER — SODIUM CHLORIDE 0.9 % IV SOLN
INTRAVENOUS | Status: DC
  Administered 2018-05-04: 05:00:00 via INTRAVENOUS

## 2018-05-04 MED ORDER — HYDROCODONE-ACETAMINOPHEN 5-325 MG PO TABS: 1.0000 | ORAL_TABLET | ORAL | 0 refills | Status: DC | PRN

## 2018-05-04 MED ORDER — ALPRAZOLAM 0.5 MG PO TABS
0.5000 mg | ORAL_TABLET | Freq: Every day | ORAL | Status: DC | PRN
  Administered 2018-05-05: 0.5 mg via ORAL
  Filled 2018-05-04 (×2): qty 1

## 2018-05-04 MED ORDER — HYDROCODONE-ACETAMINOPHEN 5-325 MG PO TABS
1.0000 | ORAL_TABLET | Freq: Four times a day (QID) | ORAL | Status: DC | PRN
  Administered 2018-05-04: 1 via ORAL
  Filled 2018-05-04 (×2): qty 1

## 2018-05-04 MED ORDER — HYDRALAZINE HCL 20 MG/ML IJ SOLN
5.0000 mg | INTRAMUSCULAR | Status: DC | PRN
  Administered 2018-05-04: 5 mg via INTRAVENOUS
  Filled 2018-05-04: qty 1

## 2018-05-04 MED ORDER — VANCOMYCIN HCL IN DEXTROSE 1-5 GM/200ML-% IV SOLN
1000.0000 mg | INTRAVENOUS | Status: DC
  Administered 2018-05-04: 1000 mg via INTRAVENOUS
  Filled 2018-05-04: qty 200

## 2018-05-04 MED ORDER — FENTANYL CITRATE (PF) 100 MCG/2ML IJ SOLN
INTRAMUSCULAR | Status: AC
  Filled 2018-05-04: qty 2

## 2018-05-04 MED ORDER — INSULIN ASPART 100 UNIT/ML ~~LOC~~ SOLN
0.0000 [IU] | SUBCUTANEOUS | Status: DC
  Administered 2018-05-04: 11 [IU] via SUBCUTANEOUS

## 2018-05-04 MED ORDER — ONDANSETRON HCL 4 MG PO TABS: 4.0000 mg | ORAL_TABLET | Freq: Every day | ORAL | 1 refills | Status: DC | PRN

## 2018-05-04 MED ORDER — IOHEXOL 300 MG/ML  SOLN
INTRAMUSCULAR | Status: DC | PRN
  Administered 2018-05-04: 4 mL via URETHRAL

## 2018-05-04 MED ORDER — LEVOTHYROXINE SODIUM 100 MCG IV SOLR
62.5000 ug | Freq: Every day | INTRAVENOUS | Status: DC
  Administered 2018-05-04: 62.5 ug via INTRAVENOUS
  Filled 2018-05-04: qty 5

## 2018-05-04 MED ORDER — SODIUM CHLORIDE 0.9 % IV BOLUS
500.0000 mL | Freq: Once | INTRAVENOUS | Status: AC
  Administered 2018-05-04: 500 mL via INTRAVENOUS

## 2018-05-04 MED ORDER — INSULIN ASPART 100 UNIT/ML ~~LOC~~ SOLN
4.0000 [IU] | Freq: Three times a day (TID) | SUBCUTANEOUS | Status: DC
  Administered 2018-05-05 – 2018-05-07 (×7): 4 [IU] via SUBCUTANEOUS

## 2018-05-04 MED ORDER — INSULIN DETEMIR 100 UNIT/ML ~~LOC~~ SOLN
15.0000 [IU] | Freq: Every day | SUBCUTANEOUS | Status: DC
  Administered 2018-05-04 – 2018-05-07 (×4): 15 [IU] via SUBCUTANEOUS
  Filled 2018-05-04 (×4): qty 0.15

## 2018-05-04 MED ORDER — LEVOTHYROXINE SODIUM 25 MCG PO TABS: 125.0000 ug | ORAL_TABLET | Freq: Every day | ORAL | Status: DC

## 2018-05-04 MED ORDER — INSULIN ASPART 100 UNIT/ML ~~LOC~~ SOLN
3.0000 [IU] | Freq: Three times a day (TID) | SUBCUTANEOUS | Status: DC
  Administered 2018-05-04: 3 [IU] via SUBCUTANEOUS

## 2018-05-04 MED ORDER — INSULIN ASPART 100 UNIT/ML ~~LOC~~ SOLN
0.0000 [IU] | Freq: Three times a day (TID) | SUBCUTANEOUS | Status: DC
  Administered 2018-05-04: 11 [IU] via SUBCUTANEOUS

## 2018-05-04 MED ORDER — CEFTRIAXONE SODIUM 2 G IJ SOLR
2.0000 g | INTRAMUSCULAR | Status: DC
  Filled 2018-05-04: qty 20

## 2018-05-04 MED ORDER — PHENAZOPYRIDINE HCL 200 MG PO TABS: 200.0000 mg | ORAL_TABLET | Freq: Three times a day (TID) | ORAL | 0 refills | Status: DC | PRN

## 2018-05-04 SURGICAL SUPPLY — 13 items
BAG URO CATCHER STRL LF (MISCELLANEOUS) ×2 IMPLANT
CATH URET 5FR 28IN OPEN ENDED (CATHETERS) ×2 IMPLANT
CLOTH BEACON ORANGE TIMEOUT ST (SAFETY) ×2 IMPLANT
COVER FOOTSWITCH UNIV (MISCELLANEOUS) IMPLANT
COVER SURGICAL LIGHT HANDLE (MISCELLANEOUS) ×1 IMPLANT
GLOVE BIOGEL M STRL SZ7.5 (GLOVE) ×2 IMPLANT
GOWN STRL REUS W/TWL LRG LVL3 (GOWN DISPOSABLE) ×3 IMPLANT
GUIDEWIRE STR DUAL SENSOR (WIRE) ×2 IMPLANT
MANIFOLD NEPTUNE II (INSTRUMENTS) ×2 IMPLANT
PACK CYSTO (CUSTOM PROCEDURE TRAY) ×2 IMPLANT
STENT URET 6FRX24 CONTOUR (STENTS) ×1 IMPLANT
TRAY FOLEY CATH 16FR SILVER (SET/KITS/TRAYS/PACK) ×1 IMPLANT
TUBING CONNECTING 10 (TUBING) ×2 IMPLANT

## 2018-05-04 NOTE — Op Note (Signed)
Operative Note  Preoperative diagnosis:  1.  Obstructing 6 mm right distal ureteral stone 2.  Right hydronephrosis 3.  Sepsis secondary to urinary tract infection  Postoperative diagnosis: 1.  Obstructing 6 mm right distal ureteral stone 2.  Right hydronephrosis 3.  Sepsis secondary to urinary tract infection  Procedure(s): 1.  Cystoscopy with right JJ stent placement 2.  Right retrograde pyelogram with intraoperative interpretation Surgeon: Ellison Hughs, MD  Assistants: None  Anesthesia: General  Complications: None  EBL: Less than 5 mL  Specimens: 1.  Urine for culture and sensitivity  Drains/Catheters: 1.  Right 6 French by 24 cm JJ stent without tether 2.  16 French Foley catheter  Intraoperative findings:   1. Purulent urine drained from the right UO following JJ stent placement  Indication:  [name] is a 72 y.o. female who presented to the [facility] emergency department with a one-month history of progressively worsening right-sided flank pain without radiation and is associated with nausea/vomiting and subjective fevers.  She had a CT of the abdomen/pelvis that demonstrated an obstructing 6 mm right distal ureteral stone with right-sided hydronephrosis and perinephric stranding.  She was found to have a temperature of 103 F, a lactate of 4.8 and a white blood cell count of 12.5 after evaluation in the emergency department.  Currently, her pain is marginally controlled with IV morphine.  The risks, benefits and alternatives of the above procedures was discussed with the patient and her family.  They voiced understanding and wished to proceed.  Description of procedure:  After informed consent was obtained, the patient was brought to the operating room and general LMA anesthesia was administered. The patient was then placed in the dorsolithotomy position and prepped and draped in usual sterile fashion. A timeout was performed. A 23 French rigid  cystoscope was then inserted into the urethral meatus and advanced into the bladder under direct vision. A complete bladder survey revealed no intravesical pathology.  A 5 French open-ended catheter was then inserted into the right ureteral orifice and a right retrograde pyelogram was obtained that showed uniform dilation of the right ureter and right renal pelvis, with no other filling defects noted. A Glidewire was then used to intubate the right ureteral orifice and was advanced up to the right renal pelvis, under fluoroscopic guidance.  A 6 French by 24 cm JJ stent was then advanced over the wire and into good position within the right collecting system, confirming placement by fluoroscopy.  A 16 French Foley catheter was then placed and set to gravity drainage.  She tolerated the procedure well and was transferred to the postanesthesia unit in stable condition.  Plan: Continue empiric antibiotic coverage with Rocephin until her cultures have resulted.  She will need a total of 10 days of culture specific antibiotics prior to definitive stone treatment in approximately 2 weeks as an outpatient.

## 2018-05-04 NOTE — Anesthesia Procedure Notes (Signed)
Procedure Name: LMA Insertion Date/Time: 05/04/2018 2:24 AM Performed by: Claudia Desanctis, CRNA Pre-anesthesia Checklist: Emergency Drugs available, Patient identified, Suction available and Patient being monitored Patient Re-evaluated:Patient Re-evaluated prior to induction Oxygen Delivery Method: Circle system utilized Preoxygenation: Pre-oxygenation with 100% oxygen Induction Type: IV induction Ventilation: Mask ventilation without difficulty LMA: LMA inserted LMA Size: 4.0 Number of attempts: 1 Placement Confirmation: positive ETCO2 and breath sounds checked- equal and bilateral Tube secured with: Tape Dental Injury: Teeth and Oropharynx as per pre-operative assessment

## 2018-05-04 NOTE — Transfer of Care (Signed)
Immediate Anesthesia Transfer of Care Note  Patient: [name]  Procedure(s) Performed: CYSTOSCOPY WITH STENT PLACEMENT AND RETROGRADE PYELOGRAM (Right )  Patient Location: PACU  Anesthesia Type:General  Level of Consciousness: drowsy  Airway & Oxygen Therapy: Patient Spontanous Breathing and Patient connected to face mask  Post-op Assessment: Report given to RN and Post -op Vital signs reviewed and stable  Post vital signs: Reviewed and stable  Last Vitals:  Vitals Value Taken Time  BP 151/97 05/04/2018  2:49 AM  Temp    Pulse    Resp 20 05/04/2018  2:51 AM  SpO2    Vitals shown include unvalidated device data.  Last Pain:  Vitals:   05/03/18 2111  TempSrc: Oral  PainSc: 7          Complications: No apparent anesthesia complications

## 2018-05-04 NOTE — Progress Notes (Signed)
Patient was restless trying to get out of bed and taking the oxygen off. PCP was notified.

## 2018-05-04 NOTE — Progress Notes (Signed)
Patient is calm now. Patient had pain medication.  PCP was notified

## 2018-05-04 NOTE — Progress Notes (Addendum)
PHARMACY - PHYSICIAN COMMUNICATION CRITICAL VALUE ALERT - BLOOD CULTURE IDENTIFICATION (BCID)  [name] is an 72 y.o. female who presented to [facility] on 05/03/2018 with a chief complaint of flank pain and confusion, found to have pyelonephritis with obstructive stone and possible PNA. Stent placed by urology.  Assessment:  E coli bacteremia (likely urinary source); no history of ESBL organisms in Epic  Name of physician (or Provider) Contacted: Horris Latino  Current antibiotics: Rocephin 2g q24 hr, azithromycin  Changes to prescribed antibiotics recommended:   Patient is on recommended antibiotics - No changes needed   Will continue azithromycin for now d/t concerns for pneumonia.  Results for orders placed or performed during the hospital encounter of 05/03/18  Blood Culture ID Panel (Reflexed) (Collected: 05/03/2018  9:18 PM)  Result Value Ref Range   Enterococcus species NOT DETECTED NOT DETECTED   Listeria monocytogenes NOT DETECTED NOT DETECTED   Staphylococcus species NOT DETECTED NOT DETECTED   Staphylococcus aureus NOT DETECTED NOT DETECTED   Streptococcus species NOT DETECTED NOT DETECTED   Streptococcus agalactiae NOT DETECTED NOT DETECTED   Streptococcus pneumoniae NOT DETECTED NOT DETECTED   Streptococcus pyogenes NOT DETECTED NOT DETECTED   Acinetobacter baumannii NOT DETECTED NOT DETECTED   Enterobacteriaceae species DETECTED (A) NOT DETECTED   Enterobacter cloacae complex NOT DETECTED NOT DETECTED   Escherichia coli DETECTED (A) NOT DETECTED   Klebsiella oxytoca NOT DETECTED NOT DETECTED   Klebsiella pneumoniae NOT DETECTED NOT DETECTED   Proteus species NOT DETECTED NOT DETECTED   Serratia marcescens NOT DETECTED NOT DETECTED   Carbapenem resistance NOT DETECTED NOT DETECTED   Haemophilus influenzae NOT DETECTED NOT DETECTED   Neisseria meningitidis NOT DETECTED NOT DETECTED   Pseudomonas aeruginosa NOT DETECTED NOT DETECTED   Candida albicans NOT  DETECTED NOT DETECTED   Candida glabrata NOT DETECTED NOT DETECTED   Candida krusei NOT DETECTED NOT DETECTED   Candida parapsilosis NOT DETECTED NOT DETECTED   Candida tropicalis NOT DETECTED NOT DETECTED    Deliah Strehlow A 05/04/2018  11:26 AM

## 2018-05-04 NOTE — Progress Notes (Signed)
Temp elevated to 102.8 after rectal tylenol. MD paged.

## 2018-05-04 NOTE — Progress Notes (Signed)
Pharmacy Antibiotic Note  [name] is a 72 y.o. female admitted on 05/03/2018 with obstructing right ureteral stone with sepsis, Ecoli bacteremia.  Pharmacy has now been consulted to escalate antibiotics to Vancomycin and Cefepime dosing instead of ceftriaxone and azithromycin d/t clinical worsening, transfer to ICU-SD.  Plan:  Cefepime 2g IV q12h  Vancomycin 1g IV q48h.  Check vancomycin levels if remains on vancomycin > 3-4 days.  Goal AUC 400-500.  Follow up renal fxn, culture results, and clinical course.  F/u ability to de-escalate antibiotics.  Discussed with Dr. Clide Cliff on 6/18 and she wants broad spectrum tonight, but recommends narrowing tomorrow if pt is clinically improved.  Risk for MRSA is low given pt is from home and likely urinary source of Ecoli.    Height: 5\' 2"  (157.5 cm) Weight: 166 lb 3.6 oz (75.4 kg) IBW/kg (Calculated) : 50.1  Temp (24hrs), Avg:100.4 F (38 C), Min:97.9 F (36.6 C), Max:103 F (39.4 C)  Recent Labs  Lab 05/03/18 2113 05/03/18 2208 05/03/18 2346 05/04/18 0119 05/04/18 0838 05/04/18 0847  WBC 12.5*  --   --   --   --  13.6*  CREATININE 1.29*  --   --   --   --  1.35*  LATICACIDVEN  --  4.82* 2.26* 2.8* 1.8  --     Estimated Creatinine Clearance: 35.8 mL/min (A) (by C-G formula based on SCr of 1.35 mg/dL (H)).    Allergies  Allergen Reactions  . Codeine Nausea Only    Antimicrobials this admission: 6/18 ceftriaxone >> 6/18 6/18 azithromycin >>  6/18 vancomycin >> 6/18 cefepime >>  Dose adjustments this admission:   Microbiology results: 6/17 UCx: 6/17 BCx:  GNR     6/18 BCID:   Enterobacteriaceae species, Escherichia coli     Thank you for allowing pharmacy to be a part of this patient's care.  Gretta Arab PharmD, BCPS Pager [phone] 05/04/2018 7:20 PM

## 2018-05-04 NOTE — ED Notes (Signed)
Report given to Arab at [facility]

## 2018-05-04 NOTE — Consult Note (Signed)
Urology Consult   Physician requesting consult: Loetta Rough, MD  Reason for consult: Obstructing right ureteral stone with sepsis  History of Present Illness: [name] is a 72 y.o.-year-old female who presented to the [facility] emergency department with a one-month history of progressively worsening right-sided flank pain without radiation and is associated with nausea/vomiting and subjective fevers.  She had a CT of the abdomen/pelvis that demonstrated an obstructing 6 mm right distal ureteral stone with right-sided hydronephrosis and perinephric stranding.  She was found to have a temperature of 103 F, a lactate of 4.8 and a white blood cell count of 12.5 after evaluation in the emergency department.  Currently, her pain is marginally controlled with IV morphine.  The patient denies a history of voiding or storage urinary symptoms, hematuria, UTIs, STDs, urolithiasis, GU malignancy/trauma/surgery.  Past Medical History:  Diagnosis Date  . ADENOCARCINOMA, OVARY 02/12/2010  . Bronchitis   . C O P D 10/20/2008  . CROHN'S DISEASE 10/20/2008  . DIABETES, TYPE 2 10/20/2008  . EMPHYSEMA 10/20/2008  . HYPERLIPIDEMIA 10/20/2008  . HYPOTHYROIDISM, POST-RADIATION 06/06/2010  . MUSCLE CRAMPS 09/19/2010    Past Surgical History:  Procedure Laterality Date  . APPENDECTOMY    . EYE SURGERY    . JOINT REPLACEMENT    . OVARY SURGERY     radiation rods used    Current Hospital Medications:  Home Meds:  No outpatient medications have been marked as taking for the 05/03/18 encounter [facility] Encounter).    Scheduled Meds: . insulin aspart  0-9 Units Subcutaneous Q4H   Continuous Infusions: . sodium chloride    . cefTRIAXone (ROCEPHIN)  IV    . lactated ringers Stopped (05/03/18 2200)   PRN Meds:.acetaminophen **OR** acetaminophen, ondansetron **OR** ondansetron (ZOFRAN) IV  Allergies:  Allergies  Allergen Reactions  . Codeine Nausea Only  . Oxycodone Hcl     Family  History  Problem Relation Age of Onset  . Emphysema Father   . Emphysema Sister   . Emphysema Brother   . Cancer Mother        Skin Cancer  . Thyroid disease Mother        uncertain type     Social History:  reports that she has quit smoking. She has never used smokeless tobacco. She reports that she does not drink alcohol or use drugs.  ROS: A complete review of systems was performed.  All systems are negative except for pertinent findings as noted.  Physical Exam:  Vital signs in last 24 hours: Temp:  [103 F (39.4 C)] 103 F (39.4 C) (06/17 2111) Pulse Rate:  [83-97] 90 (06/18 0100) Resp:  [20-27] 20 (06/18 0100) BP: (91-118)/(31-84) 91/78 (06/18 0100) SpO2:  [95 %-98 %] 95 % (06/18 0100) Weight:  [72.6 kg (160 lb)] 72.6 kg (160 lb) (06/17 2111) Constitutional:  Alert and oriented, No acute distress Cardiovascular: Regular rate and rhythm, No JVD Respiratory: Normal respiratory effort, Lungs clear bilaterally GI: Abdomen is soft, nontender, nondistended, no abdominal masses GU: Right CVA tenderness Lymphatic: No lymphadenopathy Neurologic: Grossly intact, no focal deficits Psychiatric: Normal mood and affect  Laboratory Data:  Recent Labs    05/03/18 2113  WBC 12.5*  HGB 12.1  HCT 38.9  PLT 154    Recent Labs    05/03/18 2113  NA 137  K 3.6  CL 102  GLUCOSE 339*  BUN 18  CALCIUM 8.3*  CREATININE 1.29*     Results for orders placed or performed during  the hospital encounter of 05/03/18 (from the past 24 hour(s))  Comprehensive metabolic panel     Status: Abnormal   Collection Time: 05/03/18  9:13 PM  Result Value Ref Range   Sodium 137 135 - 145 mmol/L   Potassium 3.6 3.5 - 5.1 mmol/L   Chloride 102 101 - 111 mmol/L   CO2 20 (L) 22 - 32 mmol/L   Glucose, Bld 339 (H) 65 - 99 mg/dL   BUN 18 6 - 20 mg/dL   Creatinine, Ser 1.29 (H) 0.44 - 1.00 mg/dL   Calcium 8.3 (L) 8.9 - 10.3 mg/dL   Total Protein 6.3 (L) 6.5 - 8.1 g/dL   Albumin 3.0 (L) 3.5 -  5.0 g/dL   AST 27 15 - 41 U/L   ALT 13 (L) 14 - 54 U/L   Alkaline Phosphatase 76 38 - 126 U/L   Total Bilirubin 1.0 0.3 - 1.2 mg/dL   GFR calc non Af Amer 40 (L) >60 mL/min   GFR calc Af Amer 47 (L) >60 mL/min   Anion gap 15 5 - 15  CBC with Differential     Status: Abnormal   Collection Time: 05/03/18  9:13 PM  Result Value Ref Range   WBC 12.5 (H) 4.0 - 10.5 K/uL   RBC 4.22 3.87 - 5.11 MIL/uL   Hemoglobin 12.1 12.0 - 15.0 g/dL   HCT 38.9 36.0 - 46.0 %   MCV 92.2 78.0 - 100.0 fL   MCH 28.7 26.0 - 34.0 pg   MCHC 31.1 30.0 - 36.0 g/dL   RDW 13.4 11.5 - 15.5 %   Platelets 154 150 - 400 K/uL   Neutrophils Relative % 91 %   Lymphocytes Relative 4 %   Monocytes Relative 5 %   Eosinophils Relative 0 %   Basophils Relative 0 %   Neutro Abs 11.4 (H) 1.7 - 7.7 K/uL   Lymphs Abs 0.5 (L) 0.7 - 4.0 K/uL   Monocytes Absolute 0.6 0.1 - 1.0 K/uL   Eosinophils Absolute 0.0 0.0 - 0.7 K/uL   Basophils Absolute 0.0 0.0 - 0.1 K/uL   RBC Morphology POLYCHROMASIA PRESENT    WBC Morphology INCREASED BANDS (>20% BANDS)   Protime-INR     Status: Abnormal   Collection Time: 05/03/18  9:13 PM  Result Value Ref Range   Prothrombin Time 16.7 (H) 11.4 - 15.2 seconds   INR 1.37   Urinalysis, Routine w reflex microscopic     Status: Abnormal   Collection Time: 05/03/18  9:28 PM  Result Value Ref Range   Color, Urine YELLOW YELLOW   APPearance CLOUDY (A) CLEAR   Specific Gravity, Urine 1.013 1.005 - 1.030   pH 5.0 5.0 - 8.0   Glucose, UA >=500 (A) NEGATIVE mg/dL   Hgb urine dipstick LARGE (A) NEGATIVE   Bilirubin Urine NEGATIVE NEGATIVE   Ketones, ur 20 (A) NEGATIVE mg/dL   Protein, ur 100 (A) NEGATIVE mg/dL   Nitrite POSITIVE (A) NEGATIVE   Leukocytes, UA LARGE (A) NEGATIVE   RBC / HPF 21-50 0 - 5 RBC/hpf   WBC, UA >50 (H) 0 - 5 WBC/hpf   Bacteria, UA MANY (A) NONE SEEN   Squamous Epithelial / LPF 0-5 0 - 5   WBC Clumps PRESENT   I-Stat CG4 Lactic Acid, ED     Status: Abnormal   Collection  Time: 05/03/18 10:08 PM  Result Value Ref Range   Lactic Acid, Venous 4.82 (HH) 0.5 - 1.9 mmol/L   Comment  NOTIFIED PHYSICIAN   I-Stat CG4 Lactic Acid, ED     Status: Abnormal   Collection Time: 05/03/18 11:46 PM  Result Value Ref Range   Lactic Acid, Venous 2.26 (HH) 0.5 - 1.9 mmol/L   Comment NOTIFIED PHYSICIAN   CBG monitoring, ED     Status: Abnormal   Collection Time: 05/04/18 12:56 AM  Result Value Ref Range   Glucose-Capillary 323 (H) 65 - 99 mg/dL  Protime-INR     Status: Abnormal   Collection Time: 05/04/18  1:19 AM  Result Value Ref Range   Prothrombin Time 17.9 (H) 11.4 - 15.2 seconds   INR 1.49   APTT     Status: Abnormal   Collection Time: 05/04/18  1:19 AM  Result Value Ref Range   aPTT 38 (H) 24 - 36 seconds   No results found for this or any previous visit (from the past 240 hour(s)).  Renal Function: Recent Labs    05/03/18 2113  CREATININE 1.29*   Estimated Creatinine Clearance: 36.8 mL/min (A) (by C-G formula based on SCr of 1.29 mg/dL (H)).  Radiologic Imaging: Ct Abdomen Pelvis Wo Contrast  Result Date: 05/03/2018 CLINICAL DATA:  LEFT flank pain, abdominal pain. History of Crohn's disease, ovarian cancer, appendectomy. EXAM: CT ABDOMEN AND PELVIS WITHOUT CONTRAST TECHNIQUE: Multidetector CT imaging of the abdomen and pelvis was performed following the standard protocol without IV contrast. COMPARISON:  CT abdomen and pelvis April 22, 2014 FINDINGS: LOWER CHEST: Trace pleural effusion versus pleural thickening. Included heart size is normal. Minimal pericardial thickening. HEPATOBILIARY: Normal. PANCREAS: Normal. SPLEEN: Normal. ADRENALS/URINARY TRACT: Kidneys are orthotopic, demonstrating normal size and morphology. RIGHT cortical scarring, unchanged. Moderate RIGHT hydroureteronephrosis to the level of the distal ureter where a 6 mm calculus is present. Limited assessment for renal masses by nonenhanced CT. Urinary bladder is partially distended with  subcentimeter dependent calculi. Normal adrenal glands. STOMACH/BOWEL: The stomach, small and large bowel are normal in course and caliber without inflammatory changes, sensitivity decreased by lack of enteric contrast. Mild amount of retained large bowel stool. A few scattered colonic diverticula present VASCULAR/LYMPHATIC: Aortoiliac vessels are normal in course and caliber. No lymphadenopathy by CT size criteria. REPRODUCTIVE: Normal. OTHER: Minimal RIGHT retroperitoneal effusion in fat stranding. No intraperitoneal free fluid. MUSCULOSKELETAL: Non-acute. Streak artifact from LEFT total arthroplasty. Asymmetrically atrophic LEFT ileo psoas muscle. Minimal grade 1 L4-5 anterolisthesis. Severe L4-5 facet arthropathy. No spondylolysis. IMPRESSION: 1. 6 mm distal RIGHT ureteral calculus resulting in moderate obstructive uropathy. 2. Subcentimeter bladder calculi. Electronically Signed   By: Elon Alas M.D.   On: 05/03/2018 23:49   Dg Chest 2 View  Result Date: 05/03/2018 CLINICAL DATA:  Altered mental status.  Fever.  History of COPD. EXAM: CHEST - 2 VIEW COMPARISON:  None. FINDINGS: Cardiac silhouette is mildly enlarged, mediastinal silhouette is not suspicious, calcified aortic arch. Mild interstitial prominence. Patchy RIGHT peripheral airspace opacity. No pleural effusion. Biapical pleural thickening. No pneumothorax. Soft tissue planes and included osseous structures are non suspicious, mild degenerative change of the thoracic spine. IMPRESSION: Mild cardiomegaly. Mild interstitial prominence seen with pulmonary edema or atypical infection. RIGHT pneumonia, confluent edema or pleural thickening. Aortic Atherosclerosis (ICD10-I70.0). Electronically Signed   By: Elon Alas M.D.   On: 05/03/2018 22:14    I independently reviewed the above imaging studies.  Impression/Recommendation 72 year old female with an obstructing right distal ureteral calculus, right hydronephrosis, altered mental  status and sepsis secondary to a urinary tract infection.  History of Crohn's disease, type  2 diabetes, obesity, hypertension, COPD and hyperlipidemia  -The patient received 3 g of IV Rocephin and azithromycin while in the emergency department.  Blood and urine cultures are pending. -The risk, benefits and alternatives of cystoscopy with right JJ stent placement was discussed with the patient and her husband and son, who were at bedside.  The risks include, but are not limited to, bleeding, urinary tract infection, stent related discomfort, ureteral injury, ureteral avulsion, the need for multiple surgeries to address her obstructing stone, MI, CVA, PE, DVT and the inherent risk of general anesthesia.  She voices understanding and wishes to proceed.  Ellison Hughs, MD [facility] 05/04/2018, 2:05 AM

## 2018-05-04 NOTE — Progress Notes (Signed)
Elink notified the RN for a follow-up on patient's status.

## 2018-05-04 NOTE — Anesthesia Preprocedure Evaluation (Addendum)
Anesthesia Evaluation  Patient identified by MRN, date of birth, ID band Patient awake    Reviewed: Allergy & Precautions, H&P , NPO status , Patient's Chart, lab work & pertinent test results, reviewed documented beta blocker date and time   Airway Mallampati: I  TM Distance: >3 FB Neck ROM: full    Dental no notable dental hx. (+) Edentulous Upper, Edentulous Lower   Pulmonary COPD, former smoker,    Pulmonary exam normal breath sounds clear to auscultation       Cardiovascular Exercise Tolerance: Good hypertension, Pt. on medications Normal cardiovascular exam Rhythm:regular Rate:Normal  Echo 11/18 Study Conclusions  - Left ventricle: The cavity size was normal. There was mild focal   basal hypertrophy of the septum. Systolic function was normal.   The estimated ejection fraction was in the range of 60% to 65%.   Wall motion was normal; there were no regional wall motion   abnormalities. There was an increased relative contribution of   atrial contraction to ventricular filling. Doppler parameters are   consistent with abnormal left ventricular relaxation (grade 1   diastolic dysfunction). Doppler parameters are consistent with   high ventricular filling pressure.   Neuro/Psych negative psych ROS   GI/Hepatic   Endo/Other  diabetes, Type 2Hypothyroidism   Renal/GU ARFRenal disease     Musculoskeletal   Abdominal   Peds  Hematology   Anesthesia Other Findings   Reproductive/Obstetrics                            Anesthesia Physical Anesthesia Plan  ASA: III and emergent  Anesthesia Plan: General   Post-op Pain Management:    Induction: Intravenous  PONV Risk Score and Plan: 3 and Ondansetron, Dexamethasone and Treatment may vary due to age or medical condition  Airway Management Planned: LMA and Oral ETT  Additional Equipment:   Intra-op Plan:   Post-operative Plan:    Informed Consent: I have reviewed the patients History and Physical, chart, labs and discussed the procedure including the risks, benefits and alternatives for the proposed anesthesia with the patient or authorized representative who has indicated his/her understanding and acceptance.   Dental Advisory Given  Plan Discussed with: CRNA, Surgeon and Anesthesiologist  Anesthesia Plan Comments: ( )        Anesthesia Quick Evaluation

## 2018-05-04 NOTE — H&P (Signed)
History and Physical    [name] QPY:[id number] DOB: 08-Sep-1946 DOA: 05/03/2018  PCP: Everardo Beals, NP  Patient coming from: Home.  Chief Complaint: Flank pain and confusion.  HPI: [name] is a 72 y.o. female with history of diabetes mellitus type 2, hypertension, hyperlipidemia was brought to the ER after patient was found to have increasing right flank pain and confusion since yesterday morning.  Patient's granddaughter provided the history.  Patient's granddaughter states that patient's daughter had called EMS since patient was not getting out of the bed and complaining of right flank pain since morning with no nausea vomiting or diarrhea.  Patient also was increasingly confused.  Did not have any chest pain or shortness of breath.  ED Course: In the ER patient was febrile tachycardic elevated lactate level UA was consistent with UTI and CT abdomen pelvis shows right-sided hydronephrosis with distal ureteral stone.  Patient was started on empiric antibiotics for sepsis from UTI and also possible pneumonia.  Review of Systems: As per HPI, rest all negative.   Past Medical History:  Diagnosis Date  . ADENOCARCINOMA, OVARY 02/12/2010  . Bronchitis   . C O P D 10/20/2008  . CROHN'S DISEASE 10/20/2008  . DIABETES, TYPE 2 10/20/2008  . EMPHYSEMA 10/20/2008  . HYPERLIPIDEMIA 10/20/2008  . HYPOTHYROIDISM, POST-RADIATION 06/06/2010  . MUSCLE CRAMPS 09/19/2010    Past Surgical History:  Procedure Laterality Date  . APPENDECTOMY    . EYE SURGERY    . JOINT REPLACEMENT    . OVARY SURGERY     radiation rods used     reports that she has quit smoking. She has never used smokeless tobacco. She reports that she does not drink alcohol or use drugs.  Allergies  Allergen Reactions  . Codeine Nausea Only  . Oxycodone Hcl     Family History  Problem Relation Age of Onset  . Emphysema Father   . Emphysema Sister   . Emphysema Brother   . Cancer Mother        Skin  Cancer  . Thyroid disease Mother        uncertain type     Prior to Admission medications   Medication Sig Start Date End Date Taking? Authorizing Provider  alendronate (FOSAMAX) 10 MG tablet Take 10 mg by mouth daily before breakfast. Take with a full glass of water on an empty stomach.     [provider]  diazepam (VALIUM) 5 MG tablet 1-2 tablets three times a day as needed for muscle spasms and cramps     [provider]  ergocalciferol (VITAMIN D2) 50000 UNITS capsule Take 50,000 Units by mouth once a week.      [provider]  gabapentin (NEURONTIN) 100 MG capsule Take 300 mg by mouth 2 (two) times daily.     [provider]  gemfibrozil (LOPID) 600 MG tablet Take 600 mg by mouth 2 (two) times daily.      [provider]  glipiZIDE-metformin (METAGLIP) 5-500 MG per tablet Take 1 tablet by mouth 2 (two) times daily before a meal.    [provider]  hydrochlorothiazide (HYDRODIURIL) 12.5 MG tablet Take 1 tablet (12.5 mg total) by mouth daily. 07/31/17   [name], MD  Insulin Glargine (LANTUS SOLOSTAR) 100 UNIT/ML Solostar Pen Inject into the skin daily at 10 pm. 38 UNITS AT NIGHT    [provider]  levothyroxine (SYNTHROID, LEVOTHROID) 125 MCG tablet TAKE ONE TABLET BY MOUTH EVERY  DAY 06/11/11   Renato Shin, MD  methocarbamol (ROBAXIN) 500 MG tablet TAKE ONE TABLET BY MOUTH EVERY 6 HOURS AS NEEDED FOR CRAMPS 05/10/11   Renato Shin, MD  oxycodone (OXY-IR) 5 MG capsule Take 5 mg by mouth every 4 (four) hours as needed.    [provider]  potassium chloride SA (K-DUR,KLOR-CON) 20 MEQ tablet Take 1 tablet (20 mEq total) by mouth daily. 07/31/17   [name], MD  telmisartan-hydrochlorothiazide (MICARDIS HCT) 40-12.5 MG tablet Take 1 tablet by mouth daily.    [provider]    Physical Exam: Vitals:   05/03/18 2108 05/03/18 2111 05/03/18 2149  BP:  107/84 (!) 118/31  Pulse:  85 97  Resp:   (!) 26 (!) 26  Temp:  (!) 103 F (39.4 C)   TempSrc:  Oral   SpO2: 97% 97% 96%  Weight:  72.6 kg (160 lb)   Height:  5' 2"  (1.575 m)       Constitutional: Moderately built and nourished. Vitals:   05/03/18 2108 05/03/18 2111 05/03/18 2149  BP:  107/84 (!) 118/31  Pulse:  85 97  Resp:  (!) 26 (!) 26  Temp:  (!) 103 F (39.4 C)   TempSrc:  Oral   SpO2: 97% 97% 96%  Weight:  72.6 kg (160 lb)   Height:  5' 2"  (1.575 m)    Eyes: Anicteric no pallor. ENMT: No discharge from the ears eyes nose or mouth. Neck: No mass felt.  No neck rigidity. Respiratory: No rhonchi or crepitations. Cardiovascular: S1-S2 heard no murmurs appreciated. Abdomen: Soft nontender bowel sounds present. Musculoskeletal: No edema.  Joint effusion. Skin: No rash. Neurologic: Alert awake oriented to name and place appears confused moves all extremities follows commands. Psychiatric: Appears confused.   Labs on Admission: I have personally reviewed following labs and imaging studies  CBC: Recent Labs  Lab 05/03/18 2113  WBC 12.5*  NEUTROABS 11.4*  HGB 12.1  HCT 38.9  MCV 92.2  PLT 505   Basic Metabolic Panel: Recent Labs  Lab 05/03/18 2113  NA 137  K 3.6  CL 102  CO2 20*  GLUCOSE 339*  BUN 18  CREATININE 1.29*  CALCIUM 8.3*   GFR: Estimated Creatinine Clearance: 36.8 mL/min (A) (by C-G formula based on SCr of 1.29 mg/dL (H)). Liver Function Tests: Recent Labs  Lab 05/03/18 2113  AST 27  ALT 13*  ALKPHOS 76  BILITOT 1.0  PROT 6.3*  ALBUMIN 3.0*   No results for input(s): LIPASE, AMYLASE in the last 168 hours. No results for input(s): AMMONIA in the last 168 hours. Coagulation Profile: Recent Labs  Lab 05/03/18 2113  INR 1.37   Cardiac Enzymes: No results for input(s): CKTOTAL, CKMB, CKMBINDEX, TROPONINI in the last 168 hours. BNP (last 3 results) No results for input(s): PROBNP in the last 8760 hours. HbA1C: No results for input(s): HGBA1C in the last 72  hours. CBG: No results for input(s): GLUCAP in the last 168 hours. Lipid Profile: No results for input(s): CHOL, HDL, LDLCALC, TRIG, CHOLHDL, LDLDIRECT in the last 72 hours. Thyroid Function Tests: No results for input(s): TSH, T4TOTAL, FREET4, T3FREE, THYROIDAB in the last 72 hours. Anemia Panel: No results for input(s): VITAMINB12, FOLATE, FERRITIN, TIBC, IRON, RETICCTPCT in the last 72 hours. Urine analysis:    Component Value Date/Time   COLORURINE YELLOW 05/03/2018 2128   APPEARANCEUR CLOUDY (A) 05/03/2018 2128   LABSPEC 1.013 05/03/2018 2128   PHURINE 5.0 05/03/2018 2128  GLUCOSEU >=500 (A) 05/03/2018 2128   HGBUR LARGE (A) 05/03/2018 2128   BILIRUBINUR NEGATIVE 05/03/2018 2128   KETONESUR 20 (A) 05/03/2018 2128   PROTEINUR 100 (A) 05/03/2018 2128   UROBILINOGEN 0.2 05/27/2014 1203   NITRITE POSITIVE (A) 05/03/2018 2128   LEUKOCYTESUR LARGE (A) 05/03/2018 2128   Sepsis Labs: @LABRCNTIP (procalcitonin:4,lacticidven:4) )No results found for this or any previous visit (from the past 240 hour(s)).   Radiological Exams on Admission: Ct Abdomen Pelvis Wo Contrast  Result Date: 05/03/2018 CLINICAL DATA:  LEFT flank pain, abdominal pain. History of Crohn's disease, ovarian cancer, appendectomy. EXAM: CT ABDOMEN AND PELVIS WITHOUT CONTRAST TECHNIQUE: Multidetector CT imaging of the abdomen and pelvis was performed following the standard protocol without IV contrast. COMPARISON:  CT abdomen and pelvis April 22, 2014 FINDINGS: LOWER CHEST: Trace pleural effusion versus pleural thickening. Included heart size is normal. Minimal pericardial thickening. HEPATOBILIARY: Normal. PANCREAS: Normal. SPLEEN: Normal. ADRENALS/URINARY TRACT: Kidneys are orthotopic, demonstrating normal size and morphology. RIGHT cortical scarring, unchanged. Moderate RIGHT hydroureteronephrosis to the level of the distal ureter where a 6 mm calculus is present. Limited assessment for renal masses by nonenhanced CT.  Urinary bladder is partially distended with subcentimeter dependent calculi. Normal adrenal glands. STOMACH/BOWEL: The stomach, small and large bowel are normal in course and caliber without inflammatory changes, sensitivity decreased by lack of enteric contrast. Mild amount of retained large bowel stool. A few scattered colonic diverticula present VASCULAR/LYMPHATIC: Aortoiliac vessels are normal in course and caliber. No lymphadenopathy by CT size criteria. REPRODUCTIVE: Normal. OTHER: Minimal RIGHT retroperitoneal effusion in fat stranding. No intraperitoneal free fluid. MUSCULOSKELETAL: Non-acute. Streak artifact from LEFT total arthroplasty. Asymmetrically atrophic LEFT ileo psoas muscle. Minimal grade 1 L4-5 anterolisthesis. Severe L4-5 facet arthropathy. No spondylolysis. IMPRESSION: 1. 6 mm distal RIGHT ureteral calculus resulting in moderate obstructive uropathy. 2. Subcentimeter bladder calculi. Electronically Signed   By: Elon Alas M.D.   On: 05/03/2018 23:49   Dg Chest 2 View  Result Date: 05/03/2018 CLINICAL DATA:  Altered mental status.  Fever.  History of COPD. EXAM: CHEST - 2 VIEW COMPARISON:  None. FINDINGS: Cardiac silhouette is mildly enlarged, mediastinal silhouette is not suspicious, calcified aortic arch. Mild interstitial prominence. Patchy RIGHT peripheral airspace opacity. No pleural effusion. Biapical pleural thickening. No pneumothorax. Soft tissue planes and included osseous structures are non suspicious, mild degenerative change of the thoracic spine. IMPRESSION: Mild cardiomegaly. Mild interstitial prominence seen with pulmonary edema or atypical infection. RIGHT pneumonia, confluent edema or pleural thickening. Aortic Atherosclerosis (ICD10-I70.0). Electronically Signed   By: Elon Alas M.D.   On: 05/03/2018 22:14      Assessment/Plan Principal Problem:   Sepsis ([place]) Active Problems:   Acute lower UTI   Uncontrolled type 2 diabetes mellitus with  hyperglycemia ([place])   ARF (acute renal failure) ([place])   Essential hypertension    1. Sepsis from pyelonephritis with obstructive stone in the distal right ureter with hydronephrosis -I have consulted on-call urologist Dr. Lovena Neighbours, who will be seeing patient for possible stent placement.  Patient is on empiric antibiotics for sepsis.  Follow lactate levels procalcitonin cultures continue with hydration patient will be kept n.p.o. for now. 2. Acute renal failure likely from sepsis and obstruction.  Follow metabolic panel. 3. Diabetes mellitus type 2 -for now I kept patient on sliding scale coverage.  Once patient is come out after the procedure and started diet may have to restart home medication based on patient's intake. 4. Hypertension we will keep patient on PRN  IV hydralazine for now.  Once patient starts taking orally may resume home medications based on patient's condition. 5. History of hyperlipidemia. 6. Acute encephalopathy likely from sepsis. 7. Possible pneumonia.   DVT prophylaxis: SCDs. Code Status: Full code. Family Communication: Patient's granddaughter. Disposition Plan: Home. Consults called: Urologist. Admission status: Inpatient.   Rise Patience MD [facility] Pager [phone].  If 7PM-7AM, please contact night-coverage [email or web address] Password [facility]  05/04/2018, 12:37 AM

## 2018-05-04 NOTE — Progress Notes (Signed)
   Follow Up Note  HPI: Please refer to full HPI done earlier this am Briefly, 72 year old female with history of diabetes, hypertension, hyperlipidemia presented to the ER after worsening right flank pain and confusion that has been ongoing for days.  In the ER, patient was febrile, tachycardic, with lactic acidosis, UA consistent with UTI, CT abdomen pelvis showed right-sided hydronephrosis with distal ureteral stone.  Patient was started on antibiotics and urology consulted.  Urology placed a right ureteral stent on 05/04/2018.  Patient admitted for further management.  Earlier today, patient reported doing better, still with right flank tenderness.  Blood culture x2 later noted to be positive for E. Coli.  Later this evening, patient noted to start spiking temp as high as 102.8, other vital signs stable, with change in mental status.  Due to the decline, patient was transferred to SDU for closer monitoring.  Changed IV antibiotics to cefepime, vancomycin.  Increased IV fluid rate.  Of note, patient is on a lot of sedating meds, narcotics at home, may slowly restart meds once patient mentation is at baseline to avoid withdrawal. May D/C Vancomycin in am due to AKI and BC reporting E.coli bacteremia.    Exam: CV: S1, S2 present Lungs: CTA B Abd: Soft, right flank tenderness, nondistended, bowel sounds present Ext: No pedal edema bilaterally  Present on Admission: . Sepsis ([place]) . Acute lower UTI . Uncontrolled type 2 diabetes mellitus with hyperglycemia ([place]) . ARF (acute renal failure) ([place]) . Essential hypertension   Disposition:  Continue present management as noted above

## 2018-05-04 NOTE — Progress Notes (Signed)
Patient had 13 beats of V-Tach. PCP was notified

## 2018-05-04 NOTE — Anesthesia Postprocedure Evaluation (Signed)
Anesthesia Post Note  Patient: [name]  Procedure(s) Performed: CYSTOSCOPY WITH STENT PLACEMENT AND RETROGRADE PYELOGRAM (Right )     Patient location during evaluation: PACU Anesthesia Type: General Level of consciousness: awake and alert Pain management: pain level controlled Vital Signs Assessment: post-procedure vital signs reviewed and stable Respiratory status: spontaneous breathing, nonlabored ventilation, respiratory function stable and patient connected to nasal cannula oxygen Cardiovascular status: blood pressure returned to baseline and stable Postop Assessment: no apparent nausea or vomiting Anesthetic complications: no    Last Vitals:  Vitals:   05/04/18 1834 05/04/18 1839  BP: 123/60 (!) 122/50  Pulse: 99 100  Resp: (!) 24 (!) 26  Temp:    SpO2: 99% 98%    Last Pain:  Vitals:   05/04/18 1826  TempSrc: Oral  PainSc:                  Madge Therrien

## 2018-05-04 NOTE — ED Notes (Signed)
carelink contacted for tx to [place]:  [phone].  Dr. Lovena Neighbours accepting.

## 2018-05-05 ENCOUNTER — Encounter (HOSPITAL_COMMUNITY): Payer: Self-pay | Admitting: Urology

## 2018-05-05 DIAGNOSIS — A419 Sepsis, unspecified organism: Secondary | ICD-10-CM

## 2018-05-05 LAB — BASIC METABOLIC PANEL
Anion gap: 10 (ref 5–15)
BUN: 26 mg/dL — ABNORMAL HIGH (ref 6–20)
CO2: 19 mmol/L — ABNORMAL LOW (ref 22–32)
Calcium: 7 mg/dL — ABNORMAL LOW (ref 8.9–10.3)
Chloride: 110 mmol/L (ref 101–111)
Creatinine, Ser: 1.15 mg/dL — ABNORMAL HIGH (ref 0.44–1.00)
GFR calc Af Amer: 54 mL/min — ABNORMAL LOW (ref >60)
GFR calc non Af Amer: 46 mL/min — ABNORMAL LOW (ref >60)
Glucose, Bld: 140 mg/dL — ABNORMAL HIGH (ref 65–99)
Potassium: 3.3 mmol/L — ABNORMAL LOW (ref 3.5–5.1)
Sodium: 139 mmol/L (ref 135–145)

## 2018-05-05 LAB — GLUCOSE, CAPILLARY
Glucose-Capillary: 154 mg/dL — ABNORMAL HIGH (ref 65–99)
Glucose-Capillary: 131 mg/dL — ABNORMAL HIGH (ref 65–99)
Glucose-Capillary: 112 mg/dL — ABNORMAL HIGH (ref 65–99)
Glucose-Capillary: 165 mg/dL — ABNORMAL HIGH (ref 65–99)
Glucose-Capillary: 120 mg/dL — ABNORMAL HIGH (ref 65–99)

## 2018-05-05 LAB — CBC WITH DIFFERENTIAL/PLATELET
Basophils Absolute: 0 10*3/uL (ref 0.0–0.1)
Basophils Relative: 0 %
Eosinophils Absolute: 0 10*3/uL (ref 0.0–0.7)
Eosinophils Relative: 0 %
HCT: 31.9 % — ABNORMAL LOW (ref 36.0–46.0)
Hemoglobin: 10.5 g/dL — ABNORMAL LOW (ref 12.0–15.0)
Lymphocytes Relative: 6 %
Lymphs Abs: 0.6 10*3/uL — ABNORMAL LOW (ref 0.7–4.0)
MCH: 29 pg (ref 26.0–34.0)
MCHC: 32.9 g/dL (ref 30.0–36.0)
MCV: 88.1 fL (ref 78.0–100.0)
Monocytes Absolute: 0.6 10*3/uL (ref 0.1–1.0)
Monocytes Relative: 6 %
Neutro Abs: 8.8 10*3/uL — ABNORMAL HIGH (ref 1.7–7.7)
Neutrophils Relative %: 88 %
Platelets: 119 10*3/uL — ABNORMAL LOW (ref 150–400)
RBC: 3.62 MIL/uL — ABNORMAL LOW (ref 3.87–5.11)
RDW: 13.6 % (ref 11.5–15.5)
WBC: 10 10*3/uL (ref 4.0–10.5)

## 2018-05-05 LAB — BLOOD GAS, ARTERIAL
Acid-base deficit: 1.6 mmol/L (ref 0.0–2.0)
Bicarbonate: 18.1 mmol/L — ABNORMAL LOW (ref 20.0–28.0)
Drawn by: 257881
O2 Saturation: 95.1 %
Patient temperature: 102
pCO2 arterial: 23 mmHg — ABNORMAL LOW (ref 32.0–48.0)
pH, Arterial: 7.516 — ABNORMAL HIGH (ref 7.350–7.450)
pO2, Arterial: 74.9 mmHg — ABNORMAL LOW (ref 83.0–108.0)

## 2018-05-05 LAB — PROCALCITONIN: Procalcitonin: 49.79 ng/mL

## 2018-05-05 MED ORDER — INSULIN ASPART 100 UNIT/ML ~~LOC~~ SOLN
0.0000 [IU] | SUBCUTANEOUS | Status: DC
  Administered 2018-05-05 – 2018-05-06 (×2): 2 [IU] via SUBCUTANEOUS
  Administered 2018-05-06: 8 [IU] via SUBCUTANEOUS
  Administered 2018-05-06: 5 [IU] via SUBCUTANEOUS
  Administered 2018-05-07: 2 [IU] via SUBCUTANEOUS
  Administered 2018-05-07: 5 [IU] via SUBCUTANEOUS

## 2018-05-05 MED ORDER — POTASSIUM CHLORIDE CRYS ER 20 MEQ PO TBCR
30.0000 meq | EXTENDED_RELEASE_TABLET | Freq: Once | ORAL | Status: DC
  Filled 2018-05-05: qty 1

## 2018-05-05 MED ORDER — SODIUM CHLORIDE 0.9 % IV SOLN
2.0000 g | INTRAVENOUS | Status: DC
  Administered 2018-05-05 – 2018-05-06 (×2): 2 g via INTRAVENOUS
  Filled 2018-05-05 (×2): qty 20
  Filled 2018-05-05 (×2): qty 2

## 2018-05-05 MED ORDER — NYSTATIN 100000 UNIT/ML MT SUSP
5.0000 mL | Freq: Four times a day (QID) | OROMUCOSAL | Status: DC
  Administered 2018-05-05 – 2018-05-07 (×9): 500000 [IU] via ORAL
  Filled 2018-05-05 (×9): qty 5

## 2018-05-05 MED ORDER — HYDROMORPHONE HCL 1 MG/ML IJ SOLN
0.5000 mg | INTRAMUSCULAR | Status: DC | PRN
  Administered 2018-05-05 – 2018-05-06 (×6): 0.5 mg via INTRAVENOUS
  Filled 2018-05-05 (×6): qty 1

## 2018-05-05 MED ORDER — OXYBUTYNIN CHLORIDE 5 MG PO TABS
5.0000 mg | ORAL_TABLET | Freq: Three times a day (TID) | ORAL | Status: DC | PRN
Start: 2018-05-05 — End: 2018-05-07
  Administered 2018-05-05 – 2018-05-06 (×3): 5 mg via ORAL
  Filled 2018-05-05 (×3): qty 1

## 2018-05-05 MED ORDER — FLUCONAZOLE 100 MG PO TABS
100.0000 mg | ORAL_TABLET | Freq: Every day | ORAL | Status: DC
  Administered 2018-05-05 – 2018-05-07 (×3): 100 mg via ORAL
  Filled 2018-05-05 (×3): qty 1

## 2018-05-05 MED ORDER — BELLADONNA ALKALOIDS-OPIUM 16.2-60 MG RE SUPP: 1.0000 | Freq: Four times a day (QID) | RECTAL | Status: DC | PRN

## 2018-05-05 NOTE — Progress Notes (Signed)
PROGRESS NOTE    [name]  VOJ:[id number] DOB: 08-05-1946 DOA: 05/03/2018 PCP: Everardo Beals, NP   Brief Narrative: 72 year old female with history of diabetes, hypertension, hyperlipidemia presented to the ER after worsening right flank pain and confusion that has been ongoing for days.  In the ER, patient was febrile, tachycardic, with lactic acidosis, UA consistent with UTI, CT abdomen pelvis showed right-sided hydronephrosis with distal ureteral stone.  Patient was started on antibiotics and urology consulted.  Urology placed a right ureteral stent on 05/04/2018.  Patient admitted for further management.  Earlier today, patient reported doing better, still with right flank tenderness.  Blood culture x2 later noted to be positive for E. Coli.  Later this evening, patient noted to start spiking temp as high as 102.8, other vital signs stable, with change in mental status.  Due to the decline, patient was transferred to SDU for closer monitoring.  Changed IV antibiotics to cefepime, vancomycin.  Increased IV fluid rate.  Of note, patient is on a lot of sedating meds, narcotics at home, may slowly restart meds once patient mentation is at baseline to avoid withdrawal. May D/C Vancomycin in am due to AKI and BC reporting E.coli bacteremia.      Assessment & Plan:   Principal Problem:   Sepsis ([place]) Active Problems:   Acute lower UTI   Uncontrolled type 2 diabetes mellitus with hyperglycemia ([facility])   ARF (acute renal failure) ([facility])   Essential hypertension  1] E. coli bacteremia, UTI and sepsis antibiotics changed to Rocephin today.  She is status post stent placement in the right ureter secondary to 6 mm obstructing stone.  Urology following.  2] dysphagia patient had an episode of choking last night while she was trying to swallow potassium pills.  Speech eval recommended grade 1 dysphagia diet.  3]-we will try nystatin and Diflucan.  4] hypertension  5] acute renal  failure  6] type II diet on Levemir insulin 15 mg daily, NovoLog 4 units 3 times daily with meals.  7] hypothyroidism continue Synthroid   DVT prophylaxis:scd Code Status: full Family Communication: Discussed with daughter Disposition Plan: TBD   Consultants: uro  Procedures: 05/04/2018 patient had right ureteral stent placed Antimicrobials: Rocephin Subjective: Complaining of right-sided abdominal pain   Objective: Vitals:   05/05/18 0600 05/05/18 0743 05/05/18 0800 05/05/18 1200  BP: (!) 138/54  (!) 124/46 135/73  Pulse:      Resp: 19  19 16   Temp:  99.7 F (37.6 C)  98.9 F (37.2 C)  TempSrc:  Oral  Oral  SpO2: 97%  96% 97%  Weight:      Height:        Intake/Output Summary (Last 24 hours) at 05/05/2018 1255 Last data filed at 05/05/2018 1100 Gross per 24 hour  Intake 3583.84 ml  Output 1400 ml  Net 2183.84 ml   Filed Weights   05/03/18 2111 05/04/18 0409 05/04/18 2000  Weight: 72.6 kg (160 lb) 75.4 kg (166 lb 3.6 oz) 77 kg (169 lb 12.1 oz)    Examination:  General exam: Appears calm and comfortable  Respiratory system: Clear to auscultation. Respiratory effort normal. Cardiovascular system: S1 & S2 heard, RRR. No JVD, murmurs, rubs, gallops or clicks. No pedal edema. Gastrointestinal system: Abdomen is nondistended, soft and tender right abdomen. No organomegaly or masses felt. Normal bowel sounds heard. Central nervous system: Alert and oriented. No focal neurological deficits. Extremities: Symmetric 5 x 5 power. Skin: No rashes, lesions or ulcers Psychiatry:  Judgement and insight appear normal. Mood & affect appropriate.     Data Reviewed: I have personally reviewed following labs and imaging studies  CBC: Recent Labs  Lab 05/03/18 2113 05/04/18 0847 05/05/18 0316  WBC 12.5* 13.6* 10.0  NEUTROABS 11.4* 12.2* 8.8*  HGB 12.1 10.5* 10.5*  HCT 38.9 31.7* 31.9*  MCV 92.2 89.3 88.1  PLT 154 134* 295*   Basic Metabolic Panel: Recent Labs  Lab  05/03/18 2113 05/04/18 0847 05/05/18 0316  NA 137 137 139  K 3.6 3.3* 3.3*  CL 102 105 110  CO2 20* 21* 19*  GLUCOSE 339* 293* 140*  BUN 18 26* 26*  CREATININE 1.29* 1.35* 1.15*  CALCIUM 8.3* 7.2* 7.0*   GFR: Estimated Creatinine Clearance: 43.4 mL/min (A) (by C-G formula based on SCr of 1.15 mg/dL (H)). Liver Function Tests: Recent Labs  Lab 05/03/18 2113 05/04/18 0847  AST 27 24  ALT 13* 12*  ALKPHOS 76 63  BILITOT 1.0 0.7  PROT 6.3* 5.4*  ALBUMIN 3.0* 2.5*   No results for input(s): LIPASE, AMYLASE in the last 168 hours. No results for input(s): AMMONIA in the last 168 hours. Coagulation Profile: Recent Labs  Lab 05/03/18 2113 05/04/18 0119  INR 1.37 1.49   Cardiac Enzymes: No results for input(s): CKTOTAL, CKMB, CKMBINDEX, TROPONINI in the last 168 hours. BNP (last 3 results) No results for input(s): PROBNP in the last 8760 hours. HbA1C: Recent Labs    05/04/18 0838  HGBA1C 9.4*   CBG: Recent Labs  Lab 05/04/18 1632 05/04/18 1737 05/04/18 2150 05/05/18 0734 05/05/18 1113  GLUCAP 341* 318* 154* 131* 112*   Lipid Profile: No results for input(s): CHOL, HDL, LDLCALC, TRIG, CHOLHDL, LDLDIRECT in the last 72 hours. Thyroid Function Tests: Recent Labs    05/04/18 0847  TSH 1.917  FREET4 0.99   Anemia Panel: No results for input(s): VITAMINB12, FOLATE, FERRITIN, TIBC, IRON, RETICCTPCT in the last 72 hours. Sepsis Labs: Recent Labs  Lab 05/03/18 2208 05/03/18 2346 05/04/18 0119 05/04/18 0838 05/04/18 1251 05/05/18 0316  PROCALCITON  --   --   --   --  64.44 49.79  LATICACIDVEN 4.82* 2.26* 2.8* 1.8  --   --     Recent Results (from the past 240 hour(s))  Culture, blood (Routine x 2)     Status: Abnormal (Preliminary result)   Collection Time: 05/03/18  9:18 PM  Result Value Ref Range Status   Specimen Description BLOOD LEFT WRIST  Final   Special Requests   Final    BOTTLES DRAWN AEROBIC AND ANAEROBIC Blood Culture results may not be  optimal due to an inadequate volume of blood received in culture bottles   Culture  Setup Time   Final    GRAM NEGATIVE RODS ANAEROBIC BOTTLE ONLY CRITICAL RESULT CALLED TO, READ BACK BY AND VERIFIED WITH: Marcy Siren PharmD 10:45 05/04/18 ([email or web address]) Performed at [facility], [place] [address], [place], [place] [address]    Culture ESCHERICHIA COLI (A)  Final   Report Status PENDING  Incomplete  Blood Culture ID Panel (Reflexed)     Status: Abnormal   Collection Time: 05/03/18  9:18 PM  Result Value Ref Range Status   Enterococcus species NOT DETECTED NOT DETECTED Final   Listeria monocytogenes NOT DETECTED NOT DETECTED Final   Staphylococcus species NOT DETECTED NOT DETECTED Final   Staphylococcus aureus NOT DETECTED NOT DETECTED Final   Streptococcus species NOT DETECTED NOT DETECTED Final   Streptococcus agalactiae NOT DETECTED  NOT DETECTED Final   Streptococcus pneumoniae NOT DETECTED NOT DETECTED Final   Streptococcus pyogenes NOT DETECTED NOT DETECTED Final   Acinetobacter baumannii NOT DETECTED NOT DETECTED Final   Enterobacteriaceae species DETECTED (A) NOT DETECTED Final    Comment: Enterobacteriaceae represent a large family of gram-negative bacteria, not a single organism. CRITICAL RESULT CALLED TO, READ BACK BY AND VERIFIED WITH: Marcy Siren PharmD 10:45 05/04/18 ([email or web address])    Enterobacter cloacae complex NOT DETECTED NOT DETECTED Final   Escherichia coli DETECTED (A) NOT DETECTED Final    Comment: CRITICAL RESULT CALLED TO, READ BACK BY AND VERIFIED WITH: Marcy Siren PharmD 10:45 05/04/18 ([email or web address])    Klebsiella oxytoca NOT DETECTED NOT DETECTED Final   Klebsiella pneumoniae NOT DETECTED NOT DETECTED Final   Proteus species NOT DETECTED NOT DETECTED Final   Serratia marcescens NOT DETECTED NOT DETECTED Final   Carbapenem resistance NOT DETECTED NOT DETECTED Final   Haemophilus influenzae NOT DETECTED NOT DETECTED Final   Neisseria meningitidis NOT DETECTED NOT DETECTED  Final   Pseudomonas aeruginosa NOT DETECTED NOT DETECTED Final   Candida albicans NOT DETECTED NOT DETECTED Final   Candida glabrata NOT DETECTED NOT DETECTED Final   Candida krusei NOT DETECTED NOT DETECTED Final   Candida parapsilosis NOT DETECTED NOT DETECTED Final   Candida tropicalis NOT DETECTED NOT DETECTED Final    Comment: Performed at [facility], [place] [address] [place], [place] [address]  Urine culture     Status: Abnormal (Preliminary result)   Collection Time: 05/03/18  9:28 PM  Result Value Ref Range Status   Specimen Description URINE, CATHETERIZED  Final   Special Requests NONE  Final   Culture (A)  Final    >=100,000 COLONIES/mL KLEBSIELLA PNEUMONIAE SUSCEPTIBILITIES TO FOLLOW Performed at [facility], [place] [address], [place], [place] [address]    Report Status PENDING  Incomplete  Culture, blood (Routine x 2)     Status: None (Preliminary result)   Collection Time: 05/03/18  9:53 PM  Result Value Ref Range Status   Specimen Description BLOOD RIGHT HAND  Final   Special Requests   Final    BOTTLES DRAWN AEROBIC AND ANAEROBIC Blood Culture results may not be optimal due to an inadequate volume of blood received in culture bottles   Culture  Setup Time   Final    GRAM NEGATIVE RODS IN BOTH AEROBIC AND ANAEROBIC BOTTLES CRITICAL VALUE NOTED.  VALUE IS CONSISTENT WITH PREVIOUSLY REPORTED AND CALLED VALUE. Performed at [facility], [place] [address] [place], [place] [address]    Culture GRAM NEGATIVE RODS  Final   Report Status PENDING  Incomplete  Urine Culture     Status: Abnormal (Preliminary result)   Collection Time: 05/04/18  2:40 AM  Result Value Ref Range Status   Specimen Description   Final    URINE, CATHETERIZED CYSTOSCOPE Performed at [facility] [address] [place], [place] [address]    Special Requests   Final    NONE Performed at [facility], [place] [address] [place], [place]  [address]    Culture (A)  Final    >=100,000 COLONIES/mL KLEBSIELLA PNEUMONIAE 20,000 COLONIES/mL PROTEUS MIRABILIS SUSCEPTIBILITIES TO FOLLOW Performed at [facility], [place] [address], [place], [place] [address]    Report Status PENDING  Incomplete  MRSA PCR Screening     Status: None   Collection Time: 05/04/18  7:24 PM  Result Value Ref Range Status  MRSA by PCR NEGATIVE NEGATIVE Final    Comment:        The GeneXpert MRSA Assay (FDA approved for NASAL specimens only), is one component of a comprehensive MRSA colonization surveillance program. It is not intended to diagnose MRSA infection nor to guide or monitor treatment for MRSA infections. Performed at [facility], [place] [address], [place], [place] [address]          Radiology Studies: Ct Abdomen Pelvis Wo Contrast  Result Date: 05/03/2018 CLINICAL DATA:  LEFT flank pain, abdominal pain. History of Crohn's disease, ovarian cancer, appendectomy. EXAM: CT ABDOMEN AND PELVIS WITHOUT CONTRAST TECHNIQUE: Multidetector CT imaging of the abdomen and pelvis was performed following the standard protocol without IV contrast. COMPARISON:  CT abdomen and pelvis April 22, 2014 FINDINGS: LOWER CHEST: Trace pleural effusion versus pleural thickening. Included heart size is normal. Minimal pericardial thickening. HEPATOBILIARY: Normal. PANCREAS: Normal. SPLEEN: Normal. ADRENALS/URINARY TRACT: Kidneys are orthotopic, demonstrating normal size and morphology. RIGHT cortical scarring, unchanged. Moderate RIGHT hydroureteronephrosis to the level of the distal ureter where a 6 mm calculus is present. Limited assessment for renal masses by nonenhanced CT. Urinary bladder is partially distended with subcentimeter dependent calculi. Normal adrenal glands. STOMACH/BOWEL: The stomach, small and large bowel are normal in course and caliber without inflammatory changes, sensitivity decreased by lack of enteric contrast. Mild amount of  retained large bowel stool. A few scattered colonic diverticula present VASCULAR/LYMPHATIC: Aortoiliac vessels are normal in course and caliber. No lymphadenopathy by CT size criteria. REPRODUCTIVE: Normal. OTHER: Minimal RIGHT retroperitoneal effusion in fat stranding. No intraperitoneal free fluid. MUSCULOSKELETAL: Non-acute. Streak artifact from LEFT total arthroplasty. Asymmetrically atrophic LEFT ileo psoas muscle. Minimal grade 1 L4-5 anterolisthesis. Severe L4-5 facet arthropathy. No spondylolysis. IMPRESSION: 1. 6 mm distal RIGHT ureteral calculus resulting in moderate obstructive uropathy. 2. Subcentimeter bladder calculi. Electronically Signed   By: Elon Alas M.D.   On: 05/03/2018 23:49   Dg Chest 2 View  Result Date: 05/03/2018 CLINICAL DATA:  Altered mental status.  Fever.  History of COPD. EXAM: CHEST - 2 VIEW COMPARISON:  None. FINDINGS: Cardiac silhouette is mildly enlarged, mediastinal silhouette is not suspicious, calcified aortic arch. Mild interstitial prominence. Patchy RIGHT peripheral airspace opacity. No pleural effusion. Biapical pleural thickening. No pneumothorax. Soft tissue planes and included osseous structures are non suspicious, mild degenerative change of the thoracic spine. IMPRESSION: Mild cardiomegaly. Mild interstitial prominence seen with pulmonary edema or atypical infection. RIGHT pneumonia, confluent edema or pleural thickening. Aortic Atherosclerosis (ICD10-I70.0). Electronically Signed   By: Elon Alas M.D.   On: 05/03/2018 22:14   Dg C-arm 1-60 Min-no Report  Result Date: 05/04/2018 Fluoroscopy was utilized by the requesting physician.  No radiographic interpretation.        Scheduled Meds: . insulin aspart  0-15 Units Subcutaneous Q4H  . insulin aspart  4 Units Subcutaneous TID WC  . insulin detemir  15 Units Subcutaneous Daily  . levothyroxine  125 mcg Oral Q24H  . potassium chloride  30 mEq Oral Once   Continuous Infusions: . sodium  chloride 125 mL/hr at 05/05/18 0641  . cefTRIAXone (ROCEPHIN)  IV       LOS: 1 day      Georgette Shell, MD [facility]  If 7PM-7AM, please contact night-coverage [email or web address] Password [facility] 05/05/2018, 12:55 PM

## 2018-05-05 NOTE — Evaluation (Addendum)
Clinical/Bedside Swallow Evaluation Patient Details  Name: [name] MRN: [id number] Date of Birth: Jan 28, 1946  Today's Date: 05/05/2018 Time: SLP Start Time (ACUTE ONLY): 1135 SLP Stop Time (ACUTE ONLY): 1205 SLP Time Calculation (min) (ACUTE ONLY): 30 min  Past Medical History:  Past Medical History:  Diagnosis Date  . ADENOCARCINOMA, OVARY 02/12/2010  . Bronchitis   . C O P D 10/20/2008  . CROHN'S DISEASE 10/20/2008  . DIABETES, TYPE 2 10/20/2008  . EMPHYSEMA 10/20/2008  . HYPERLIPIDEMIA 10/20/2008  . HYPOTHYROIDISM, POST-RADIATION 06/06/2010  . MUSCLE CRAMPS 09/19/2010   Past Surgical History:  Past Surgical History:  Procedure Laterality Date  . APPENDECTOMY    . CYSTOSCOPY WITH STENT PLACEMENT Right 05/04/2018   Procedure: CYSTOSCOPY WITH STENT PLACEMENT AND RETROGRADE PYELOGRAM;  Surgeon: Ceasar Mons, MD;  Location: [facility];  Service: Urology;  Laterality: Right;  . EYE SURGERY    . JOINT REPLACEMENT    . OVARY SURGERY     radiation rods used   HPI:  72 yo female adm to [facility] with AMS - diagnosed pyelonephritis and obstructing stone also found to have right lobe pna.  Pt PMH + for DM2, COPD, ovarian ca, HTN.  Pt choked on pills with water and was made NPO.  Swallow eval ordered.  Family reports pt will occasionally choke on intake - mostly cornbread etc but not significantly.  No weight loss reported.     Assessment / Plan / Recommendation Clinical Impression  Cognitive based dysphagia suspected c/b excessive oral manipulation/mastication with solids for greater than 3 minutes with solid cracker.  Pt with NO indications of aspiration/significant dysphagia with thin liquids via cup/straw nor with puree. No indications of pharyngeal residuals present.  Belch x1 noted = recommend esophageal precautions.  Dys1/thin diet recommended with hopeful plans to advance diet clinically as pt improves.  SLP can not rule out silent aspiration with pt h/o COPD and pt has h/o  hypothyroidism post-radiation thus MBS may be indicated.  Educated pt/family to current recommendations.    Oral Cavity Assessment: Erythema(posterior oral cavity on soft palate, erythemic!)  SLP Visit Diagnosis: Dysphagia, oral phase (R13.11)    Aspiration Risk  Mild aspiration risk    Diet Recommendation Dysphagia 1 (Puree);Thin liquid   Liquid Administration via: Cup;Straw Medication Administration: Whole meds with puree Supervision: Staff to assist with self feeding Compensations: Slow rate;Small sips/bites;Minimize environmental distractions;Other (Comment)(check for oral residuals) Postural Changes: Seated upright at 90 degrees;Remain upright for at least 30 minutes after po intake    Other  Recommendations Oral Care Recommendations: Oral care BID Other Recommendations: Have oral suction available   Follow up Recommendations        Frequency and Duration min 2x/week  2 weeks       Prognosis Prognosis for Safe Diet Advancement: [place] Date of Onset: 05/05/18 HPI: 72 yo female adm to [facility] with AMS - diagnosed pyelonephritis and obstructing stone also found to have right lobe pna.  Pt PMH + for DM2, COPD, ovarian ca, HTN.  Pt choked on pills with water and was made NPO.  Swallow eval ordered.  Family reports pt will occasionally choke on intake - mostly cornbread etc but not significantly.  No weight loss reported.   Type of Study: Bedside Swallow Evaluation Diet Prior to this Study: NPO Temperature Spikes Noted: Yes Respiratory Status: Nasal cannula History of Recent Intubation: No Behavior/Cognition: Alert;Distractible(sleepy) Oral Cavity Assessment: Erythema(posterior oral cavity on  soft palate, erythemic!) Oral Care Completed by SLP: Recent completion by staff Oral Cavity - Dentition: Dentures, top;Dentures, bottom Self-Feeding Abilities: Needs assist Patient Positioning: Upright in bed Baseline Vocal Quality: Normal Volitional Cough:  Weak Volitional Swallow: Unable to elicit    Oral/Motor/Sensory Function Overall Oral Motor/Sensory Function: Generalized oral weakness   Ice Chips Ice chips: Within functional limits Presentation: Spoon   Thin Liquid Thin Liquid: Within functional limits Presentation: Cup;Straw    Nectar Thick Nectar Thick Liquid: Not tested   Honey Thick Honey Thick Liquid: Not tested   Puree Puree: Within functional limits Presentation: Spoon   Solid   GO   Solid: Impaired Oral Phase Impairments: Impaired mastication Oral Phase Functional Implications: Other (comment)(excessive mastication with pt closing her eyes, requiring cues to stay alert, masticaiton upwards of 3 minutes before swallowing)        Macario Golds 05/05/2018,3:32 PM  Luanna Salk, [place] [facility] SLP [phone]

## 2018-05-05 NOTE — Evaluation (Signed)
Physical Therapy Evaluation Patient Details Name: [name] MRN: [id number] DOB: 09-19-1946 Today's Date: 05/05/2018   History of Present Illness  Pt admitted with sepsis, UTI, ARF and AMS and with hx of DM, COPD and Chrohn's.  Clinical Impression  Pt admitted as above and presenting with functional mobility limitations 2* generalized weakness, balance deficits and back pain with attempts to mobilize.  Pt would benefit from follow up rehab at SNF level to maximize IND and safety prior to return home with limited assist.    Follow Up Recommendations SNF    Equipment Recommendations  None recommended by PT    Recommendations for Other Services OT consult     Precautions / Restrictions Precautions Precautions: Fall Restrictions Weight Bearing Restrictions: No      Mobility  Bed Mobility Overal bed mobility: Needs Assistance Bed Mobility: Supine to Sit;Sit to Supine     Supine to sit: Mod assist;+2 for physical assistance;+2 for safety/equipment Sit to supine: Mod assist;Max assist;+2 for physical assistance;+2 for safety/equipment   General bed mobility comments: Increased time with physical assist to brig trunk to upright and use of pad to complete transition to EOB.  Increased assist back to bed to manage LEs and to control trunk  Transfers                 General transfer comment: NT - pt refuses to attempt 2* c/o back pain and dizziness - BP 135/42  Ambulation/Gait                Stairs            Wheelchair Mobility    Modified Rankin (Stroke Patients Only)       Balance Overall balance assessment: Needs assistance Sitting-balance support: Bilateral upper extremity supported;Feet supported Sitting balance-Leahy Scale: Fair                                       Pertinent Vitals/Pain Pain Assessment: 0-10 Pain Score: 8  Pain Location: back pain Pain Descriptors / Indicators: Aching Pain Intervention(s): Limited  activity within patient's tolerance;Monitored during session;Patient requesting pain meds-RN notified    Home Living Family/patient expects to be discharged to:: Private residence Living Arrangements: Spouse/significant other Available Help at Discharge: Family;Available PRN/intermittently Type of Home: House Home Access: Level entry     Home Layout: One level Home Equipment: Walker - 2 wheels;Cane - single point;Wheelchair - manual      Prior Function Level of Independence: Independent               Hand Dominance        Extremity/Trunk Assessment   Upper Extremity Assessment Upper Extremity Assessment: Generalized weakness    Lower Extremity Assessment Lower Extremity Assessment: Generalized weakness    Cervical / Trunk Assessment Cervical / Trunk Assessment: Kyphotic  Communication   Communication: No difficulties  Cognition Arousal/Alertness: Awake/alert Behavior During Therapy: Flat affect Overall Cognitive Status: No family/caregiver present to determine baseline cognitive functioning                                        General Comments      Exercises     Assessment/Plan    PT Assessment Patient needs continued PT services  PT Problem List Decreased strength;Decreased activity tolerance;Decreased balance;Decreased mobility;Decreased knowledge  of use of DME;Pain;Obesity       PT Treatment Interventions DME instruction;Gait training;Functional mobility training;Therapeutic activities;Therapeutic exercise;Balance training;Patient/family education    PT Goals (Current goals can be found in the Care Plan section)  Acute Rehab PT Goals Patient Stated Goal: less pain PT Goal Formulation: With patient Time For Goal Achievement: 05/19/18 Potential to Achieve Goals: Fair    Frequency Min 3X/week   Barriers to discharge Decreased caregiver support Home with spouse - pt says he is limited in ability to assist    Co-evaluation                AM-PAC PT "6 Clicks" Daily Activity  Outcome Measure Difficulty turning over in bed (including adjusting bedclothes, sheets and blankets)?: Unable Difficulty moving from lying on back to sitting on the side of the bed? : Unable Difficulty sitting down on and standing up from a chair with arms (e.g., wheelchair, bedside commode, etc,.)?: Unable Help needed moving to and from a bed to chair (including a wheelchair)?: A Lot Help needed walking in hospital room?: A Lot Help needed climbing 3-5 steps with a railing? : Total 6 Click Score: 8    End of Session Equipment Utilized During Treatment: Gait belt Activity Tolerance: Patient limited by fatigue;Patient limited by pain Patient left: in bed;with call bell/phone within reach Nurse Communication: Mobility status PT Visit Diagnosis: Difficulty in walking, not elsewhere classified (R26.2);Pain;Muscle weakness (generalized) (M62.81) Pain - part of body: (back)    Time: 5248-1859 PT Time Calculation (min) (ACUTE ONLY): 24 min   Charges:   PT Evaluation $PT Eval Moderate Complexity: 1 Mod PT Treatments $Therapeutic Activity: 8-22 mins   PT G Codes:        Pg [phone]   Maranatha Grossi 05/05/2018, 6:11 PM

## 2018-05-05 NOTE — Progress Notes (Signed)
Was attempting to give pt her morning meds, sat pt up in bed at 45 degrees, instructed her to slowly sip a small amount of water, just like she had for her night time meds, pt took large drink of water and consequently coughed it up through her mouth & nose. O2 sat remained stable while pt was coughing up water, pt did not look cyanotic. Breath sounds were assessed bilaterally and L upper lobe seems to have slight expiratory wheezes, L lower lobe clear and diminished. R breath sounds clear and deminished. Pt is sitting up in bed at 50 degrees w/ basin in lap. Contacted MD on call for CXR to check for aspiration.   Meds on hold till pt can resume PO intake.

## 2018-05-05 NOTE — Progress Notes (Signed)
Daughter for patient came out of the patient's room demanding Sapna, bedside RN, that the swallow evaluation needs to be completed immediately. Based on nonverbal behaviors, daughter was verbal aggressive while communicating. I told the daughter that there is a process we follow in regards to having procedures completed. Also, we would page speech therapy to see when the swallow evaluation can be completed. Daughter also wanted to speak with the doctor. We paged the doctor as well. Doctor is at bedside at this time. Will continue to monitor and assess situation.

## 2018-05-05 NOTE — Progress Notes (Signed)
1 Day Post-Op Subjective: Complaining of bladder spasms and suprapubic discomfort this afternoon.  Denies nausea/vomiting.  No interval fever since surgery.  Renal function and white blood cell count improving.  Urine culture grew Klebsiella pneumonia  Objective: Vital signs in last 24 hours: Temp:  [98.6 F (37 C)-102.8 F (39.3 C)] 99.3 F (37.4 C) (06/19 1617) Pulse Rate:  [93-100] 93 (06/18 2000) Resp:  [16-30] 22 (06/19 1700) BP: (93-142)/(23-82) 139/50 (06/19 1700) SpO2:  [95 %-100 %] 100 % (06/19 1700) Weight:  [77 kg (169 lb 12.1 oz)] 77 kg (169 lb 12.1 oz) (06/18 2000)  Intake/Output from previous day: 06/18 0701 - 06/19 0700 In: 2855.5 [I.V.:1300; IV Piggyback:1555.5] Out: 1400 [Urine:1400]  Intake/Output this shift: Total I/O In: 1103.3 [I.V.:1000; IV Piggyback:103.3] Out: 400 [Urine:400]  Physical Exam:  General: Alert and oriented.  Appears uncomfortable CV: RRR, palpable distal pulses Lungs: CTAB, equal chest rise Abdomen: Obese.  Suprapubic tenderness with no rebound or guarding. Gu: Foley catheter in place and draining clear-yellow urine Ext: NT, No erythema  Lab Results: Recent Labs    05/03/18 2113 05/04/18 0847 05/05/18 0316  HGB 12.1 10.5* 10.5*  HCT 38.9 31.7* 31.9*   BMET Recent Labs    05/04/18 0847 05/05/18 0316  NA 137 139  K 3.3* 3.3*  CL 105 110  CO2 21* 19*  GLUCOSE 293* 140*  BUN 26* 26*  CREATININE 1.35* 1.15*  CALCIUM 7.2* 7.0*     Studies/Results: Ct Abdomen Pelvis Wo Contrast  Result Date: 05/03/2018 CLINICAL DATA:  LEFT flank pain, abdominal pain. History of Crohn's disease, ovarian cancer, appendectomy. EXAM: CT ABDOMEN AND PELVIS WITHOUT CONTRAST TECHNIQUE: Multidetector CT imaging of the abdomen and pelvis was performed following the standard protocol without IV contrast. COMPARISON:  CT abdomen and pelvis April 22, 2014 FINDINGS: LOWER CHEST: Trace pleural effusion versus pleural thickening. Included heart size is  normal. Minimal pericardial thickening. HEPATOBILIARY: Normal. PANCREAS: Normal. SPLEEN: Normal. ADRENALS/URINARY TRACT: Kidneys are orthotopic, demonstrating normal size and morphology. RIGHT cortical scarring, unchanged. Moderate RIGHT hydroureteronephrosis to the level of the distal ureter where a 6 mm calculus is present. Limited assessment for renal masses by nonenhanced CT. Urinary bladder is partially distended with subcentimeter dependent calculi. Normal adrenal glands. STOMACH/BOWEL: The stomach, small and large bowel are normal in course and caliber without inflammatory changes, sensitivity decreased by lack of enteric contrast. Mild amount of retained large bowel stool. A few scattered colonic diverticula present VASCULAR/LYMPHATIC: Aortoiliac vessels are normal in course and caliber. No lymphadenopathy by CT size criteria. REPRODUCTIVE: Normal. OTHER: Minimal RIGHT retroperitoneal effusion in fat stranding. No intraperitoneal free fluid. MUSCULOSKELETAL: Non-acute. Streak artifact from LEFT total arthroplasty. Asymmetrically atrophic LEFT ileo psoas muscle. Minimal grade 1 L4-5 anterolisthesis. Severe L4-5 facet arthropathy. No spondylolysis. IMPRESSION: 1. 6 mm distal RIGHT ureteral calculus resulting in moderate obstructive uropathy. 2. Subcentimeter bladder calculi. Electronically Signed   By: Elon Alas M.D.   On: 05/03/2018 23:49   Dg Chest 2 View  Result Date: 05/03/2018 CLINICAL DATA:  Altered mental status.  Fever.  History of COPD. EXAM: CHEST - 2 VIEW COMPARISON:  None. FINDINGS: Cardiac silhouette is mildly enlarged, mediastinal silhouette is not suspicious, calcified aortic arch. Mild interstitial prominence. Patchy RIGHT peripheral airspace opacity. No pleural effusion. Biapical pleural thickening. No pneumothorax. Soft tissue planes and included osseous structures are non suspicious, mild degenerative change of the thoracic spine. IMPRESSION: Mild cardiomegaly. Mild interstitial  prominence seen with pulmonary edema or atypical infection. RIGHT pneumonia, confluent  edema or pleural thickening. Aortic Atherosclerosis (ICD10-I70.0). Electronically Signed   By: Elon Alas M.D.   On: 05/03/2018 22:14   Dg C-arm 1-60 Min-no Report  Result Date: 05/04/2018 Fluoroscopy was utilized by the requesting physician.  No radiographic interpretation.    Assessment/Plan:  Postop day 1 status post cystoscopy with right JJ stent placement secondary to an obstructing right distal ureteral stone Sepsis secondary to UTI Acute kidney injury-resolving  -DC Foley catheter and start as needed oxybutynin and B&O suppository -Urine cultures growing Klebsiella pneumonia.  Sensitivity still pending.  Continue empiric Rocephin -Will arrange outpatient ureteroscopy in approximately 2 weeks to address her stone.  She will need a total of 10 days of culture specific antibiotics prior to surgery.   LOS: 1 day   Ellison Hughs, MD [facility] Pager: [phone]  05/05/2018, 5:42 PM

## 2018-05-05 NOTE — Plan of Care (Signed)
  Problem: Education: Goal: Knowledge of General Education information will improve Outcome: Progressing   Problem: Health Behavior/Discharge Planning: Goal: Ability to manage health-related needs will improve Outcome: Progressing   Problem: Clinical Measurements: Goal: Ability to maintain clinical measurements within normal limits will improve Outcome: Progressing Goal: Will remain free from infection Outcome: Progressing Goal: Diagnostic test results will improve Outcome: Progressing Goal: Respiratory complications will improve Outcome: Progressing Goal: Cardiovascular complication will be avoided Outcome: Progressing   Problem: Activity: Goal: Risk for activity intolerance will decrease Outcome: Progressing   Problem: Coping: Goal: Level of anxiety will decrease Outcome: Progressing   Problem: Elimination: Goal: Will not experience complications related to bowel motility Outcome: Progressing Goal: Will not experience complications related to urinary retention Outcome: Progressing   Problem: Pain Managment: Goal: General experience of comfort will improve Outcome: Progressing   Problem: Safety: Goal: Ability to remain free from injury will improve Outcome: Not Applicable   Problem: Skin Integrity: Goal: Demonstration of wound healing without infection will improve Outcome: Progressing   Problem: Skin Integrity: Goal: Risk for impaired skin integrity will decrease Outcome: Progressing

## 2018-05-06 LAB — CULTURE, BLOOD (ROUTINE X 2)

## 2018-05-06 LAB — CBC
HCT: 29.4 % — ABNORMAL LOW (ref 36.0–46.0)
Hemoglobin: 9.9 g/dL — ABNORMAL LOW (ref 12.0–15.0)
MCH: 29.7 pg (ref 26.0–34.0)
MCHC: 33.7 g/dL (ref 30.0–36.0)
MCV: 88.3 fL (ref 78.0–100.0)
Platelets: 128 10*3/uL — ABNORMAL LOW (ref 150–400)
RBC: 3.33 MIL/uL — ABNORMAL LOW (ref 3.87–5.11)
RDW: 13.5 % (ref 11.5–15.5)
WBC: 6.8 10*3/uL (ref 4.0–10.5)

## 2018-05-06 LAB — URINE CULTURE: Culture: >=100000 — AB

## 2018-05-06 LAB — BASIC METABOLIC PANEL
Anion gap: 10 (ref 5–15)
BUN: 22 mg/dL — ABNORMAL HIGH (ref 6–20)
CO2: 18 mmol/L — ABNORMAL LOW (ref 22–32)
Calcium: 6.8 mg/dL — ABNORMAL LOW (ref 8.9–10.3)
Chloride: 110 mmol/L (ref 101–111)
Creatinine, Ser: 1.07 mg/dL — ABNORMAL HIGH (ref 0.44–1.00)
GFR calc Af Amer: 59 mL/min — ABNORMAL LOW (ref >60)
GFR calc non Af Amer: 51 mL/min — ABNORMAL LOW (ref >60)
Glucose, Bld: 131 mg/dL — ABNORMAL HIGH (ref 65–99)
Potassium: 2.9 mmol/L — ABNORMAL LOW (ref 3.5–5.1)
Sodium: 138 mmol/L (ref 135–145)

## 2018-05-06 LAB — GLUCOSE, CAPILLARY
Glucose-Capillary: 109 mg/dL — ABNORMAL HIGH (ref 65–99)
Glucose-Capillary: 118 mg/dL — ABNORMAL HIGH (ref 65–99)
Glucose-Capillary: 145 mg/dL — ABNORMAL HIGH (ref 65–99)
Glucose-Capillary: 238 mg/dL — ABNORMAL HIGH (ref 65–99)
Glucose-Capillary: 285 mg/dL — ABNORMAL HIGH (ref 65–99)
Glucose-Capillary: 86 mg/dL (ref 65–99)

## 2018-05-06 LAB — PROCALCITONIN: Procalcitonin: 28.16 ng/mL

## 2018-05-06 MED ORDER — POTASSIUM CHLORIDE CRYS ER 20 MEQ PO TBCR
30.0000 meq | EXTENDED_RELEASE_TABLET | ORAL | Status: DC
  Administered 2018-05-06: 30 meq via ORAL
  Filled 2018-05-06 (×2): qty 1

## 2018-05-06 MED ORDER — POTASSIUM CHLORIDE CRYS ER 10 MEQ PO TBCR
40.0000 meq | EXTENDED_RELEASE_TABLET | Freq: Once | ORAL | Status: AC
  Administered 2018-05-06: 40 meq via ORAL
  Filled 2018-05-06: qty 4

## 2018-05-06 MED ORDER — POTASSIUM CHLORIDE CRYS ER 10 MEQ PO TBCR
30.0000 meq | EXTENDED_RELEASE_TABLET | ORAL | Status: DC
  Administered 2018-05-06: 30 meq via ORAL
  Filled 2018-05-06: qty 3

## 2018-05-06 MED ORDER — POTASSIUM CHLORIDE 10 MEQ/100ML IV SOLN: 10.0000 meq | INTRAVENOUS | Status: DC

## 2018-05-06 MED ORDER — POTASSIUM CHLORIDE CRYS ER 20 MEQ PO TBCR: 40.0000 meq | EXTENDED_RELEASE_TABLET | ORAL | Status: DC

## 2018-05-06 NOTE — Progress Notes (Signed)
PROGRESS NOTE    [name]  ESP:[id number] DOB: August 31, 1946 DOA: 05/03/2018 PCP: Everardo Beals, NPBrief Narrative 72 year old female with history of diabetes, hypertension, hyperlipidemia presented to the ER after worsening right flank pain and confusion that has been ongoing fordays.In the ER, patient was febrile, tachycardic, with lactic acidosis, UA consistent with UTI, CT abdomen pelvis showed right-sided hydronephrosis with distal ureteral stone. Patient was started on antibiotics and urology consulted. Urology placed a right ureteral stenton 05/04/2018.Patient admitted for further management.  Earlier today, patient reported doing better, still with right flank tenderness. Blood culture x2 later noted to be positive for E. Coli.Later this evening, patient noted to start spiking temp as high as 102.8, other vital signs stable, with change in mental status.Due to the decline, patient was transferred to [facility] closer monitoring. Changed IV antibiotics to cefepime, vancomycin.IncreasedIV fluid rate.Of note, patient is on a lot of sedating meds, narcotics at home, may slowly restart meds once patient mentation is at baseline to avoid withdrawal. May D/C Vancomycin in am due to AKI and BC reporting E.coli bacteremia.      Assessment & Plan:   Principal Problem:   Sepsis ([place]) Active Problems:   Acute lower UTI   Uncontrolled type 2 diabetes mellitus with hyperglycemia (HCC)   ARF (acute renal failure) (HCC)   Essential hypertension  1]  Klebsiella  UTI and sepsis antibiotics changed to Rocephin urine culture growing 100,000 colonies of Klebsiella 20,000 g of proteus.  Sensitive to Rocephin.  She is status post stent placement in the right ureter secondary to 6 mm obstructing stone.  Patient has been afebrile, procalcitonin down to 28 from 49.  White count normalized.  Urology following.  2] dysphagia patient had an episode of choking last night while she was  trying to swallow potassium pills.  Speech eval recommended grade 1 dysphagia diet.  3]-we will try nystatin and Diflucan.  4] hypertension stable on no medications at this time.  Patient was taking hydrochlorothiazide at home.  5] acute renal failure creatinine 1.07.  Stable  6] type II diet on Levemir insulin 15 mg daily, NovoLog 4 units 3 times daily with meals.  7] hypothyroidism continue Synthroid  8] hypokalemia replete and recheck.      DVT prophylaxis:scd Code Status:full Family Communication: none Disposition Plan: tbd Consultants: gu   Procedures  R ureter stent 6/18 Antimicrobials: Rocephin. Subjective: Patient more awake alert no acute distress appears comfortable.   Objective: Vitals:   05/06/18 0400 05/06/18 0500 05/06/18 0600 05/06/18 0800  BP: (!) 116/45   (!) 135/47  Pulse:      Resp: 18 13 (!) 21 19  Temp: 98.7 F (37.1 C)   98.4 F (36.9 C)  TempSrc: Oral   Oral  SpO2: 99% 99% 99% 99%  Weight:      Height:        Intake/Output Summary (Last 24 hours) at 05/06/2018 1345 Last data filed at 05/06/2018 0925 Gross per 24 hour  Intake 1018.33 ml  Output 1250 ml  Net -231.67 ml   Filed Weights   05/03/18 2111 05/04/18 0409 05/04/18 2000  Weight: 72.6 kg (160 lb) 75.4 kg (166 lb 3.6 oz) 77 kg (169 lb 12.1 oz)    Examination:much more awake alert answers questions appropriately  General exam: Appears calm and comfortable  Respiratory system: Clear to auscultation. Respiratory effort normal. Cardiovascular system: S1 & S2 heard, RRR. No JVD, murmurs, rubs, gallops or clicks. No pedal edema. Gastrointestinal system: Abdomen is  nondistended, soft and nontender. No organomegaly or masses felt. Normal bowel sounds heard. Central nervous system: Alert and oriented. No focal neurological deficits. Extremities: Symmetric 5 x 5 power. Skin: No rashes, lesions or ulcers .     Data Reviewed: I have personally reviewed following labs and  imaging studies  CBC: Recent Labs  Lab 05/03/18 2113 05/04/18 0847 05/05/18 0316 05/06/18 0313  WBC 12.5* 13.6* 10.0 6.8  NEUTROABS 11.4* 12.2* 8.8*  --   HGB 12.1 10.5* 10.5* 9.9*  HCT 38.9 31.7* 31.9* 29.4*  MCV 92.2 89.3 88.1 88.3  PLT 154 134* 119* 962*   Basic Metabolic Panel: Recent Labs  Lab 05/03/18 2113 05/04/18 0847 05/05/18 0316 05/06/18 0313  NA 137 137 139 138  K 3.6 3.3* 3.3* 2.9*  CL 102 105 110 110  CO2 20* 21* 19* 18*  GLUCOSE 339* 293* 140* 131*  BUN 18 26* 26* 22*  CREATININE 1.29* 1.35* 1.15* 1.07*  CALCIUM 8.3* 7.2* 7.0* 6.8*   GFR: Estimated Creatinine Clearance: 46.7 mL/min (A) (by C-G formula based on SCr of 1.07 mg/dL (H)). Liver Function Tests: Recent Labs  Lab 05/03/18 2113 05/04/18 0847  AST 27 24  ALT 13* 12*  ALKPHOS 76 63  BILITOT 1.0 0.7  PROT 6.3* 5.4*  ALBUMIN 3.0* 2.5*   No results for input(s): LIPASE, AMYLASE in the last 168 hours. No results for input(s): AMMONIA in the last 168 hours. Coagulation Profile: Recent Labs  Lab 05/03/18 2113 05/04/18 0119  INR 1.37 1.49   Cardiac Enzymes: No results for input(s): CKTOTAL, CKMB, CKMBINDEX, TROPONINI in the last 168 hours. BNP (last 3 results) No results for input(s): PROBNP in the last 8760 hours. HbA1C: Recent Labs    05/04/18 0838  HGBA1C 9.4*   CBG: Recent Labs  Lab 05/05/18 2143 05/05/18 2359 05/06/18 0343 05/06/18 0730 05/06/18 1121  GLUCAP 120* 109* 118* 86 238*   Lipid Profile: No results for input(s): CHOL, HDL, LDLCALC, TRIG, CHOLHDL, LDLDIRECT in the last 72 hours. Thyroid Function Tests: Recent Labs    05/04/18 0847  TSH 1.917  FREET4 0.99   Anemia Panel: No results for input(s): VITAMINB12, FOLATE, FERRITIN, TIBC, IRON, RETICCTPCT in the last 72 hours. Sepsis Labs: Recent Labs  Lab 05/03/18 2208 05/03/18 2346 05/04/18 0119 05/04/18 0838 05/04/18 1251 05/05/18 0316 05/06/18 0313  PROCALCITON  --   --   --   --  64.44 49.79 28.16    LATICACIDVEN 4.82* 2.26* 2.8* 1.8  --   --   --     Recent Results (from the past 240 hour(s))  Culture, blood (Routine x 2)     Status: Abnormal   Collection Time: 05/03/18  9:18 PM  Result Value Ref Range Status   Specimen Description BLOOD LEFT WRIST  Final   Special Requests   Final    BOTTLES DRAWN AEROBIC AND ANAEROBIC Blood Culture results may not be optimal due to an inadequate volume of blood received in culture bottles   Culture  Setup Time   Final    GRAM NEGATIVE RODS IN BOTH AEROBIC AND ANAEROBIC BOTTLES CRITICAL RESULT CALLED TO, READ BACK BY AND VERIFIED WITH: Marcy Siren PharmD 10:45 05/04/18 ([email or web address]) Performed at [facility], [place] [address] [place],  [address]    Culture ESCHERICHIA COLI (A)  Final   Report Status 05/06/2018 FINAL  Final   Organism ID, Bacteria ESCHERICHIA COLI  Final      Susceptibility   Escherichia coli - MIC*  AMPICILLIN >=32 RESISTANT Resistant     CEFAZOLIN <=4 SENSITIVE Sensitive     CEFEPIME <=1 SENSITIVE Sensitive     CEFTAZIDIME <=1 SENSITIVE Sensitive     CEFTRIAXONE <=1 SENSITIVE Sensitive     CIPROFLOXACIN <=0.25 SENSITIVE Sensitive     GENTAMICIN <=1 SENSITIVE Sensitive     IMIPENEM <=0.25 SENSITIVE Sensitive     TRIMETH/SULFA <=20 SENSITIVE Sensitive     AMPICILLIN/SULBACTAM 16 INTERMEDIATE Intermediate     PIP/TAZO <=4 SENSITIVE Sensitive     Extended ESBL NEGATIVE Sensitive     * ESCHERICHIA COLI  Blood Culture ID Panel (Reflexed)     Status: Abnormal   Collection Time: 05/03/18  9:18 PM  Result Value Ref Range Status   Enterococcus species NOT DETECTED NOT DETECTED Final   Listeria monocytogenes NOT DETECTED NOT DETECTED Final   Staphylococcus species NOT DETECTED NOT DETECTED Final   Staphylococcus aureus NOT DETECTED NOT DETECTED Final   Streptococcus species NOT DETECTED NOT DETECTED Final   Streptococcus agalactiae NOT DETECTED NOT DETECTED Final   Streptococcus pneumoniae NOT DETECTED NOT DETECTED  Final   Streptococcus pyogenes NOT DETECTED NOT DETECTED Final   Acinetobacter baumannii NOT DETECTED NOT DETECTED Final   Enterobacteriaceae species DETECTED (A) NOT DETECTED Final    Comment: Enterobacteriaceae represent a large family of gram-negative bacteria, not a single organism. CRITICAL RESULT CALLED TO, READ BACK BY AND VERIFIED WITH: Marcy Siren PharmD 10:45 05/04/18 ([email or web address])    Enterobacter cloacae complex NOT DETECTED NOT DETECTED Final   Escherichia coli DETECTED (A) NOT DETECTED Final    Comment: CRITICAL RESULT CALLED TO, READ BACK BY AND VERIFIED WITH: Marcy Siren PharmD 10:45 05/04/18 ([email or web address])    Klebsiella oxytoca NOT DETECTED NOT DETECTED Final   Klebsiella pneumoniae NOT DETECTED NOT DETECTED Final   Proteus species NOT DETECTED NOT DETECTED Final   Serratia marcescens NOT DETECTED NOT DETECTED Final   Carbapenem resistance NOT DETECTED NOT DETECTED Final   Haemophilus influenzae NOT DETECTED NOT DETECTED Final   Neisseria meningitidis NOT DETECTED NOT DETECTED Final   Pseudomonas aeruginosa NOT DETECTED NOT DETECTED Final   Candida albicans NOT DETECTED NOT DETECTED Final   Candida glabrata NOT DETECTED NOT DETECTED Final   Candida krusei NOT DETECTED NOT DETECTED Final   Candida parapsilosis NOT DETECTED NOT DETECTED Final   Candida tropicalis NOT DETECTED NOT DETECTED Final    Comment: Performed at [facility], [place] [address], [place], [place] [address]  Urine culture     Status: Abnormal   Collection Time: 05/03/18  9:28 PM  Result Value Ref Range Status   Specimen Description URINE, CATHETERIZED  Final   Special Requests   Final    NONE Performed at [facility], [place] [address], [place], [place] [address]    Culture >=100,000 COLONIES/mL KLEBSIELLA PNEUMONIAE (A)  Final   Report Status 05/06/2018 FINAL  Final   Organism ID, Bacteria KLEBSIELLA PNEUMONIAE (A)  Final      Susceptibility   Klebsiella pneumoniae - MIC*    AMPICILLIN >=32  RESISTANT Resistant     CEFAZOLIN <=4 SENSITIVE Sensitive     CEFTRIAXONE <=1 SENSITIVE Sensitive     CIPROFLOXACIN <=0.25 SENSITIVE Sensitive     GENTAMICIN <=1 SENSITIVE Sensitive     IMIPENEM <=0.25 SENSITIVE Sensitive     NITROFURANTOIN <=16 SENSITIVE Sensitive     TRIMETH/SULFA <=20 SENSITIVE Sensitive     AMPICILLIN/SULBACTAM 16 INTERMEDIATE Intermediate     PIP/TAZO <=4 SENSITIVE Sensitive  Extended ESBL NEGATIVE Sensitive     * >=100,000 COLONIES/mL KLEBSIELLA PNEUMONIAE  Culture, blood (Routine x 2)     Status: None (Preliminary result)   Collection Time: 05/03/18  9:53 PM  Result Value Ref Range Status   Specimen Description BLOOD RIGHT HAND  Final   Special Requests   Final    BOTTLES DRAWN AEROBIC AND ANAEROBIC Blood Culture results may not be optimal due to an inadequate volume of blood received in culture bottles   Culture  Setup Time   Final    GRAM NEGATIVE RODS IN BOTH AEROBIC AND ANAEROBIC BOTTLES CRITICAL VALUE NOTED.  VALUE IS CONSISTENT WITH PREVIOUSLY REPORTED AND CALLED VALUE. Performed at [facility], [place] [address], [place], [place] [address]    Culture GRAM NEGATIVE RODS  Final   Report Status PENDING  Incomplete  Urine Culture     Status: Abnormal (Preliminary result)   Collection Time: 05/04/18  2:40 AM  Result Value Ref Range Status   Specimen Description   Final    URINE, CATHETERIZED CYSTOSCOPE Performed at [place] [address] [place], [place] [address]    Special Requests   Final    NONE Performed at [facility], [place] [address], [place], [place] [address]    Culture (A)  Final    >=100,000 COLONIES/mL KLEBSIELLA PNEUMONIAE 20,000 COLONIES/mL PROTEUS MIRABILIS CULTURE REINCUBATED FOR BETTER GROWTH Performed at [facility], [place] [address], [place], [place] [address]    Report Status PENDING  Incomplete   Organism ID, Bacteria KLEBSIELLA PNEUMONIAE (A)  Final   Organism ID,  Bacteria PROTEUS MIRABILIS (A)  Final      Susceptibility   Klebsiella pneumoniae - MIC*    AMPICILLIN >=32 RESISTANT Resistant     CEFAZOLIN <=4 SENSITIVE Sensitive     CEFTRIAXONE <=1 SENSITIVE Sensitive     CIPROFLOXACIN <=0.25 SENSITIVE Sensitive     GENTAMICIN <=1 SENSITIVE Sensitive     IMIPENEM <=0.25 SENSITIVE Sensitive     NITROFURANTOIN <=16 SENSITIVE Sensitive     TRIMETH/SULFA <=20 SENSITIVE Sensitive     AMPICILLIN/SULBACTAM 16 INTERMEDIATE Intermediate     PIP/TAZO <=4 SENSITIVE Sensitive     Extended ESBL NEGATIVE Sensitive     * >=100,000 COLONIES/mL KLEBSIELLA PNEUMONIAE   Proteus mirabilis - MIC*    AMPICILLIN <=2 SENSITIVE Sensitive     CEFAZOLIN <=4 SENSITIVE Sensitive     CEFTRIAXONE <=1 SENSITIVE Sensitive     CIPROFLOXACIN <=0.25 SENSITIVE Sensitive     GENTAMICIN <=1 SENSITIVE Sensitive     IMIPENEM 4 SENSITIVE Sensitive     NITROFURANTOIN 128 RESISTANT Resistant     TRIMETH/SULFA <=20 SENSITIVE Sensitive     AMPICILLIN/SULBACTAM <=2 SENSITIVE Sensitive     PIP/TAZO <=4 SENSITIVE Sensitive     * 20,000 COLONIES/mL PROTEUS MIRABILIS  MRSA PCR Screening     Status: None   Collection Time: 05/04/18  7:24 PM  Result Value Ref Range Status   MRSA by PCR NEGATIVE NEGATIVE Final    Comment:        The GeneXpert MRSA Assay (FDA approved for NASAL specimens only), is one component of a comprehensive MRSA colonization surveillance program. It is not intended to diagnose MRSA infection nor to guide or monitor treatment for MRSA infections. Performed at [facility], [place] [address], [place], [place] [address]          Radiology Studies: No results found.  Scheduled Meds: . fluconazole  100 mg Oral Daily  . insulin aspart  0-15 Units Subcutaneous Q4H  . insulin aspart  4 Units Subcutaneous TID WC  . insulin detemir  15 Units Subcutaneous Daily  . levothyroxine  125 mcg Oral Q24H  . nystatin  5 mL Oral QID   Continuous  Infusions: . cefTRIAXone (ROCEPHIN)  IV Stopped (05/05/18 2340)     LOS: 2 days    Georgette Shell, MD [facility]  If 7PM-7AM, please contact night-coverage [email or web address] Password [email or web address] 05/06/2018, 1:45 PM

## 2018-05-06 NOTE — Progress Notes (Signed)
  Speech Language Pathology Treatment: Dysphagia  Patient Details Name: TREINA ARSCOTT MRN: [id number] DOB: 14-Oct-1946 Today's Date: 05/06/2018 Time: 2426-8341 SLP Time Calculation (min) (ACUTE ONLY): 16 min  Assessment / Plan / Recommendation Clinical Impression  Pt awake in bed, much improved mentation and desiring better food.  SLP observed pt self feeding rice krispy cereal and 3 ounces [facility] water screen without deficits.  No indication of aspiration or increased work of breathing during assessment.  Note pt continues with significant erythema with minimal white outlining on posterior soft palate - Recommend advanced diet and SLP to sign off.  Advised pt to continue to take medicine with puree for safety.  No SLP follow up needed.    HPI HPI: 72 yo female adm to [facility] with AMS - diagnosed pyelonephritis and obstructing stone also found to have right lobe pna.  Pt PMH + for DM2, COPD, ovarian ca, HTN.  Pt choked on pills with water and was made NPO.  Swallow eval ordered.  Family reports pt will occasionally choke on intake - mostly cornbread etc but not significantly.  No weight loss reported.        SLP Plan  Continue with current plan of care       Recommendations  Diet recommendations: Thin liquid;Regular Liquids provided via: Cup;Straw Medication Administration: Whole meds with puree Compensations: Slow rate;Small sips/bites;Minimize environmental distractions;Other (Comment) Postural Changes and/or Swallow Maneuvers: Seated upright 90 degrees;Upright 30-60 min after meal                Oral Care Recommendations: Oral care BID SLP Visit Diagnosis: Dysphagia, oral phase (R13.11) Plan: Continue with current plan of care       GO               Luanna Salk, [facility] SLP [phone]  Macario Golds 05/06/2018, 12:35 PM

## 2018-05-06 NOTE — Progress Notes (Signed)
Came into pt's room to check on her. Pt was awake, alert, and oriented x4. Pt has been confused and restless all night up until now. Conversing pleasantly with this RN. Remembers events up to her surgery then intermittently remembers being on the 4th floor, does not recall transferring to the ICU/SD or any events up until now. VSS. Will continue to monitor.

## 2018-05-07 LAB — URINE CULTURE: Culture: >=100000 — AB

## 2018-05-07 LAB — BASIC METABOLIC PANEL
Anion gap: 8 (ref 5–15)
BUN: 17 mg/dL (ref 6–20)
CO2: 20 mmol/L — ABNORMAL LOW (ref 22–32)
Calcium: 7.3 mg/dL — ABNORMAL LOW (ref 8.9–10.3)
Chloride: 109 mmol/L (ref 101–111)
Creatinine, Ser: 0.98 mg/dL (ref 0.44–1.00)
GFR calc Af Amer: >60 mL/min (ref >60)
GFR calc non Af Amer: 56 mL/min — ABNORMAL LOW (ref >60)
Glucose, Bld: 116 mg/dL — ABNORMAL HIGH (ref 65–99)
Potassium: 3.7 mmol/L (ref 3.5–5.1)
Sodium: 137 mmol/L (ref 135–145)

## 2018-05-07 LAB — CBC
HCT: 30.8 % — ABNORMAL LOW (ref 36.0–46.0)
Hemoglobin: 10.4 g/dL — ABNORMAL LOW (ref 12.0–15.0)
MCH: 29.5 pg (ref 26.0–34.0)
MCHC: 33.8 g/dL (ref 30.0–36.0)
MCV: 87.3 fL (ref 78.0–100.0)
Platelets: 153 10*3/uL (ref 150–400)
RBC: 3.53 MIL/uL — ABNORMAL LOW (ref 3.87–5.11)
RDW: 13.6 % (ref 11.5–15.5)
WBC: 6.2 10*3/uL (ref 4.0–10.5)

## 2018-05-07 MED ORDER — OXYCODONE HCL 5 MG PO TABS: 5.0000 mg | ORAL_TABLET | Freq: Four times a day (QID) | ORAL | 0 refills | Status: AC | PRN

## 2018-05-07 MED ORDER — ALPRAZOLAM 0.5 MG PO TABS: 0.5000 mg | ORAL_TABLET | Freq: Two times a day (BID) | ORAL | 0 refills | Status: DC | PRN

## 2018-05-07 MED ORDER — OXYBUTYNIN CHLORIDE 5 MG PO TABS: 5.0000 mg | ORAL_TABLET | Freq: Three times a day (TID) | ORAL | 0 refills | Status: DC | PRN

## 2018-05-07 MED ORDER — ACETAMINOPHEN 325 MG PO TABS: 650.0000 mg | ORAL_TABLET | Freq: Four times a day (QID) | ORAL | Status: DC | PRN

## 2018-05-07 MED ORDER — CEPHALEXIN 500 MG PO CAPS
500.0000 mg | ORAL_CAPSULE | Freq: Three times a day (TID) | ORAL | Status: DC
  Administered 2018-05-07: 500 mg via ORAL
  Filled 2018-05-07: qty 1

## 2018-05-07 MED ORDER — BELLADONNA ALKALOIDS-OPIUM 16.2-60 MG RE SUPP: 1.0000 | Freq: Four times a day (QID) | RECTAL | 0 refills | Status: DC | PRN

## 2018-05-07 MED ORDER — INSULIN ASPART 100 UNIT/ML ~~LOC~~ SOLN: 4.0000 [IU] | Freq: Three times a day (TID) | SUBCUTANEOUS | 11 refills | Status: DC

## 2018-05-07 MED ORDER — CEPHALEXIN 500 MG PO CAPS: 500.0000 mg | ORAL_CAPSULE | Freq: Three times a day (TID) | ORAL | 0 refills | Status: DC

## 2018-05-07 MED ORDER — NYSTATIN 100000 UNIT/ML MT SUSP: 5.0000 mL | Freq: Four times a day (QID) | OROMUCOSAL | 0 refills | Status: DC

## 2018-05-07 NOTE — Discharge Summary (Addendum)
Physician Discharge Summary  [name] DDU:[id number] DOB: Feb 22, 1946 DOA: 05/03/2018  PCP: Everardo Beals, NP  Admit date: 05/03/2018 Discharge date: 05/07/2018  Admitted From: Home  Disposition: SNF Recommendations for Outpatient Follow-up:  1. Follow up with PCP in 1-2 weeks 2. Please obtain BMP/CBC in one week 3. Follow-up with urologist in 1 to 2 weeks  Home Health none Equipment/Devices: None Discharge Condition: Stable CODE STATUS: Full code Diet recommendation: Cardiac  Brief/Interim Summary:72 year old female with history of diabetes, hypertension, hyperlipidemia presented to the ER after worsening right flank pain and confusion that has been ongoing fordays.In the ER, patient was febrile, tachycardic, with lactic acidosis, UA consistent with UTI, CT abdomen pelvis showed right-sided hydronephrosis with distal ureteral stone. Patient was started on antibiotics and urology consulted. Urology placed a right ureteral stenton 05/04/2018.Patient admitted for further management.  Earlier today, patient reported doing better, still with right flank tenderness. Blood culture x2 later noted to be positive for E. Coli.Later this evening, patient noted to start spiking temp as high as 102.8, other vital signs stable, with change in mental status.Due to the decline, patient was transferred to [facility] closer monitoring. Changed IV antibiotics to cefepime, vancomycin.IncreasedIV fluid rate.Of note, patient is on a lot of sedating meds, narcotics at home, may slowly restart meds once patient mentation is at baseline to avoid withdrawal. May D/C Vancomycin in am due to AKI and BC reporting Klebsiella and Proteus bacteremia.      Discharge Diagnoses:  Principal Problem:   Sepsis ([place]) Active Problems:   Acute lower UTI   Uncontrolled type 2 diabetes mellitus with hyperglycemia (HCC)   ARF (acute renal failure) (HCC)   Essential hypertension  1] Klebsiella  UTI  and sepsis antibiotics changed to Rocephin urine culture growing 100,000 colonies of Klebsiella 20,000 g of proteus.  Sensitive to Rocephin. She is status post stent placement in the right ureter secondary to 6 mm obstructing stone.  Patient has beenafebrile, procalcitonin down to 28 from 49.  White count normalized.  Urology following.will dc on keflex for 10 days.  2]dysphagia patient had an episode of choking  while she was trying to swallow potassium pills. Speech eval recommended grade 1 dysphagia diet.  3thrush- nystatin and Diflucan.  4]hypertension stable on no medications at this time.  Patient was taking hydrochlorothiazide at home.  5]acute renal failure creatinine 1.07.  Stable  6]type II diet on Levemir insulin 15 mg daily, NovoLog 4 units 3 times daily with meals.  7]hypothyroidism continue Synthroid  8] hypokalemia resolved    Discharge Instructions   Allergies as of 05/07/2018      Reactions   Codeine Nausea Only      Medication List    STOP taking these medications   diazepam 5 MG tablet Commonly known as:  VALIUM   glipiZIDE-metformin 5-500 MG tablet Commonly known as:  METAGLIP   hydrochlorothiazide 12.5 MG tablet Commonly known as:  HYDRODIURIL   levothyroxine 125 MCG tablet Commonly known as:  SYNTHROID, LEVOTHROID   oxycodone 5 MG capsule Commonly known as:  OXY-IR Replaced by:  oxyCODONE 5 MG immediate release tablet   potassium chloride SA 20 MEQ tablet Commonly known as:  K-DUR,KLOR-CON     TAKE these medications   acetaminophen 325 MG tablet Commonly known as:  TYLENOL Take 2 tablets (650 mg total) by mouth every 6 (six) hours as needed for mild pain (or Fever >/= 101).   alendronate 10 MG tablet Commonly known as:  FOSAMAX Take 10 mg  by mouth daily before breakfast. Take with a full glass of water on an empty stomach.   ALPRAZolam 0.5 MG tablet Commonly known as:  XANAX Take 1 tablet (0.5 mg total) by mouth 2 (two)  times daily as needed for anxiety. What changed:    medication strength  how much to take  when to take this  reasons to take this   ergocalciferol 50000 units capsule Commonly known as:  VITAMIN D2 Take 50,000 Units by mouth once a week. Sundays   gabapentin 100 MG capsule Commonly known as:  NEURONTIN Take 300 mg by mouth 2 (two) times daily.   gemfibrozil 600 MG tablet Commonly known as:  LOPID Take 600 mg by mouth 2 (two) times daily.   insulin aspart 100 UNIT/ML injection Commonly known as:  novoLOG Inject 4 Units into the skin 3 (three) times daily with meals.   LEVEMIR FLEXTOUCH 100 UNIT/ML Pen Generic drug:  Insulin Detemir Inject 36 Units into the skin at bedtime.   methocarbamol 500 MG tablet Commonly known as:  ROBAXIN TAKE ONE TABLET BY MOUTH EVERY 6 HOURS AS NEEDED FOR CRAMPS   nystatin 100000 UNIT/ML suspension Commonly known as:  MYCOSTATIN Take 5 mLs (500,000 Units total) by mouth 4 (four) times daily.   ondansetron 4 MG tablet Commonly known as:  ZOFRAN Take 1 tablet (4 mg total) by mouth daily as needed for nausea or vomiting.   opium-belladonna 16.2-60 MG suppository Commonly known as:  B&O SUPPRETTES Place 1 suppository rectally every 6 (six) hours as needed for bladder spasms (use if not relieved with ditropan).   oxybutynin 5 MG tablet Commonly known as:  DITROPAN Take 1 tablet (5 mg total) by mouth every 8 (eight) hours as needed for bladder spasms.   oxybutynin 5 MG tablet Commonly known as:  DITROPAN Take 1 tablet (5 mg total) by mouth every 8 (eight) hours as needed for bladder spasms (PRN bladder spasms).   oxyCODONE 5 MG immediate release tablet Commonly known as:  Oxy IR/ROXICODONE Take 1 tablet (5 mg total) by mouth every 6 (six) hours as needed for up to 7 days for severe pain. Replaces:  oxycodone 5 MG capsule   phenazopyridine 200 MG tablet Commonly known as:  PYRIDIUM Take 1 tablet (200 mg total) by mouth 3 (three) times  daily as needed (for pain with urination).      Follow-up Information    Ceasar Mons, MD In 2 weeks.   Specialty:  Urology Why:  Plan 2nd kidney stone surgery Contact information: [place] [address] [phone]          Allergies  Allergen Reactions  . Codeine Nausea Only    Consultations:  urology   Procedures/Studies: Ct Abdomen Pelvis Wo Contrast  Result Date: 05/03/2018 CLINICAL DATA:  LEFT flank pain, abdominal pain. History of Crohn's disease, ovarian cancer, appendectomy. EXAM: CT ABDOMEN AND PELVIS WITHOUT CONTRAST TECHNIQUE: Multidetector CT imaging of the abdomen and pelvis was performed following the standard protocol without IV contrast. COMPARISON:  CT abdomen and pelvis April 22, 2014 FINDINGS: LOWER CHEST: Trace pleural effusion versus pleural thickening. Included heart size is normal. Minimal pericardial thickening. HEPATOBILIARY: Normal. PANCREAS: Normal. SPLEEN: Normal. ADRENALS/URINARY TRACT: Kidneys are orthotopic, demonstrating normal size and morphology. RIGHT cortical scarring, unchanged. Moderate RIGHT hydroureteronephrosis to the level of the distal ureter where a 6 mm calculus is present. Limited assessment for renal masses by nonenhanced CT. Urinary bladder is partially distended with subcentimeter  dependent calculi. Normal adrenal glands. STOMACH/BOWEL: The stomach, small and large bowel are normal in course and caliber without inflammatory changes, sensitivity decreased by lack of enteric contrast. Mild amount of retained large bowel stool. A few scattered colonic diverticula present VASCULAR/LYMPHATIC: Aortoiliac vessels are normal in course and caliber. No lymphadenopathy by CT size criteria. REPRODUCTIVE: Normal. OTHER: Minimal RIGHT retroperitoneal effusion in fat stranding. No intraperitoneal free fluid. MUSCULOSKELETAL: Non-acute. Streak artifact from LEFT total arthroplasty. Asymmetrically atrophic LEFT ileo  psoas muscle. Minimal grade 1 L4-5 anterolisthesis. Severe L4-5 facet arthropathy. No spondylolysis. IMPRESSION: 1. 6 mm distal RIGHT ureteral calculus resulting in moderate obstructive uropathy. 2. Subcentimeter bladder calculi. Electronically Signed   By: Elon Alas M.D.   On: 05/03/2018 23:49   Dg Chest 2 View  Result Date: 05/03/2018 CLINICAL DATA:  Altered mental status.  Fever.  History of COPD. EXAM: CHEST - 2 VIEW COMPARISON:  None. FINDINGS: Cardiac silhouette is mildly enlarged, mediastinal silhouette is not suspicious, calcified aortic arch. Mild interstitial prominence. Patchy RIGHT peripheral airspace opacity. No pleural effusion. Biapical pleural thickening. No pneumothorax. Soft tissue planes and included osseous structures are non suspicious, mild degenerative change of the thoracic spine. IMPRESSION: Mild cardiomegaly. Mild interstitial prominence seen with pulmonary edema or atypical infection. RIGHT pneumonia, confluent edema or pleural thickening. Aortic Atherosclerosis (ICD10-I70.0). Electronically Signed   By: Elon Alas M.D.   On: 05/03/2018 22:14   Dg C-arm 1-60 Min-no Report  Result Date: 05/04/2018 Fluoroscopy was utilized by the requesting physician.  No radiographic interpretation.    (Echo, Carotid, EGD, Colonoscopy, ERCP)    Subjective:   Discharge Exam: Vitals:   05/07/18 0012 05/07/18 0530  BP:  (!) 120/59  Pulse:  73  Resp:  20  Temp: 99.6 F (37.6 C) 99.8 F (37.7 C)  SpO2:  99%   Vitals:   05/06/18 1753 05/06/18 2220 05/07/18 0012 05/07/18 0530  BP: (!) 118/52 109/81  (!) 120/59  Pulse: 73 77  73  Resp: 18 (!) 21  20  Temp: 99.2 F (37.3 C) 100 F (37.8 C) 99.6 F (37.6 C) 99.8 F (37.7 C)  TempSrc: Oral Oral Oral Oral  SpO2: 100% 98%  99%  Weight:      Height:        General: Pt is alert, awake, not in acute distress Cardiovascular: RRR, S1/S2 +, no rubs, no gallops Respiratory: CTA bilaterally, no wheezing, no  rhonchi Abdominal: Soft, NT, ND, bowel sounds + Extremities: no edema, no cyanosis    The results of significant diagnostics from this hospitalization (including imaging, microbiology, ancillary and laboratory) are listed below for reference.     Microbiology: Recent Results (from the past 240 hour(s))  Culture, blood (Routine x 2)     Status: Abnormal   Collection Time: 05/03/18  9:18 PM  Result Value Ref Range Status   Specimen Description BLOOD LEFT WRIST  Final   Special Requests   Final    BOTTLES DRAWN AEROBIC AND ANAEROBIC Blood Culture results may not be optimal due to an inadequate volume of blood received in culture bottles   Culture  Setup Time   Final    GRAM NEGATIVE RODS IN BOTH AEROBIC AND ANAEROBIC BOTTLES CRITICAL RESULT CALLED TO, READ BACK BY AND VERIFIED WITH: Marcy Siren PharmD 10:45 05/04/18 ([email or web address]) Performed at [facility], [place] [address], [place], [place] [address]    Culture ESCHERICHIA COLI (A)  Final   Report Status 05/06/2018 FINAL  Final  Organism ID, Bacteria ESCHERICHIA COLI  Final      Susceptibility   Escherichia coli - MIC*    AMPICILLIN >=32 RESISTANT Resistant     CEFAZOLIN <=4 SENSITIVE Sensitive     CEFEPIME <=1 SENSITIVE Sensitive     CEFTAZIDIME <=1 SENSITIVE Sensitive     CEFTRIAXONE <=1 SENSITIVE Sensitive     CIPROFLOXACIN <=0.25 SENSITIVE Sensitive     GENTAMICIN <=1 SENSITIVE Sensitive     IMIPENEM <=0.25 SENSITIVE Sensitive     TRIMETH/SULFA <=20 SENSITIVE Sensitive     AMPICILLIN/SULBACTAM 16 INTERMEDIATE Intermediate     PIP/TAZO <=4 SENSITIVE Sensitive     Extended ESBL NEGATIVE Sensitive     * ESCHERICHIA COLI  Blood Culture ID Panel (Reflexed)     Status: Abnormal   Collection Time: 05/03/18  9:18 PM  Result Value Ref Range Status   Enterococcus species NOT DETECTED NOT DETECTED Final   Listeria monocytogenes NOT DETECTED NOT DETECTED Final   Staphylococcus species NOT DETECTED NOT DETECTED Final    Staphylococcus aureus NOT DETECTED NOT DETECTED Final   Streptococcus species NOT DETECTED NOT DETECTED Final   Streptococcus agalactiae NOT DETECTED NOT DETECTED Final   Streptococcus pneumoniae NOT DETECTED NOT DETECTED Final   Streptococcus pyogenes NOT DETECTED NOT DETECTED Final   Acinetobacter baumannii NOT DETECTED NOT DETECTED Final   Enterobacteriaceae species DETECTED (A) NOT DETECTED Final    Comment: Enterobacteriaceae represent a large family of gram-negative bacteria, not a single organism. CRITICAL RESULT CALLED TO, READ BACK BY AND VERIFIED WITH: Marcy Siren PharmD 10:45 05/04/18 ([email or web address])    Enterobacter cloacae complex NOT DETECTED NOT DETECTED Final   Escherichia coli DETECTED (A) NOT DETECTED Final    Comment: CRITICAL RESULT CALLED TO, READ BACK BY AND VERIFIED WITH: Marcy Siren PharmD 10:45 05/04/18 ([email or web address])    Klebsiella oxytoca NOT DETECTED NOT DETECTED Final   Klebsiella pneumoniae NOT DETECTED NOT DETECTED Final   Proteus species NOT DETECTED NOT DETECTED Final   Serratia marcescens NOT DETECTED NOT DETECTED Final   Carbapenem resistance NOT DETECTED NOT DETECTED Final   Haemophilus influenzae NOT DETECTED NOT DETECTED Final   Neisseria meningitidis NOT DETECTED NOT DETECTED Final   Pseudomonas aeruginosa NOT DETECTED NOT DETECTED Final   Candida albicans NOT DETECTED NOT DETECTED Final   Candida glabrata NOT DETECTED NOT DETECTED Final   Candida krusei NOT DETECTED NOT DETECTED Final   Candida parapsilosis NOT DETECTED NOT DETECTED Final   Candida tropicalis NOT DETECTED NOT DETECTED Final    Comment: Performed at [facility], [place] [address] [place], [place] [address]  Urine culture     Status: Abnormal   Collection Time: 05/03/18  9:28 PM  Result Value Ref Range Status   Specimen Description URINE, CATHETERIZED  Final   Special Requests   Final    NONE Performed at [facility], [place] [address] [place], [place] [address]    Culture  >=100,000 COLONIES/mL KLEBSIELLA PNEUMONIAE (A)  Final   Report Status 05/06/2018 FINAL  Final   Organism ID, Bacteria KLEBSIELLA PNEUMONIAE (A)  Final      Susceptibility   Klebsiella pneumoniae - MIC*    AMPICILLIN >=32 RESISTANT Resistant     CEFAZOLIN <=4 SENSITIVE Sensitive     CEFTRIAXONE <=1 SENSITIVE Sensitive     CIPROFLOXACIN <=0.25 SENSITIVE Sensitive     GENTAMICIN <=1 SENSITIVE Sensitive     IMIPENEM <=0.25 SENSITIVE Sensitive     NITROFURANTOIN <=16 SENSITIVE Sensitive  TRIMETH/SULFA <=20 SENSITIVE Sensitive     AMPICILLIN/SULBACTAM 16 INTERMEDIATE Intermediate     PIP/TAZO <=4 SENSITIVE Sensitive     Extended ESBL NEGATIVE Sensitive     * >=100,000 COLONIES/mL KLEBSIELLA PNEUMONIAE  Culture, blood (Routine x 2)     Status: None (Preliminary result)   Collection Time: 05/03/18  9:53 PM  Result Value Ref Range Status   Specimen Description BLOOD RIGHT HAND  Final   Special Requests   Final    BOTTLES DRAWN AEROBIC AND ANAEROBIC Blood Culture results may not be optimal due to an inadequate volume of blood received in culture bottles   Culture  Setup Time   Final    GRAM NEGATIVE RODS IN BOTH AEROBIC AND ANAEROBIC BOTTLES CRITICAL VALUE NOTED.  VALUE IS CONSISTENT WITH PREVIOUSLY REPORTED AND CALLED VALUE. Performed at [facility], [place] [address], [place], [place] [address]    Culture GRAM NEGATIVE RODS  Final   Report Status PENDING  Incomplete  Urine Culture     Status: Abnormal   Collection Time: 05/04/18  2:40 AM  Result Value Ref Range Status   Specimen Description   Final    URINE, CATHETERIZED CYSTOSCOPE Performed at [place] [address] [place], [place] [address]    Special Requests   Final    NONE Performed at [facility], [place] [address] [place], [place] [address]    Culture (A)  Final    >=100,000 COLONIES/mL KLEBSIELLA PNEUMONIAE 20,000 COLONIES/mL PROTEUS MIRABILIS    Report Status  05/07/2018 FINAL  Final   Organism ID, Bacteria KLEBSIELLA PNEUMONIAE (A)  Final   Organism ID, Bacteria PROTEUS MIRABILIS (A)  Final      Susceptibility   Klebsiella pneumoniae - MIC*    AMPICILLIN >=32 RESISTANT Resistant     CEFAZOLIN <=4 SENSITIVE Sensitive     CEFTRIAXONE <=1 SENSITIVE Sensitive     CIPROFLOXACIN <=0.25 SENSITIVE Sensitive     GENTAMICIN <=1 SENSITIVE Sensitive     IMIPENEM <=0.25 SENSITIVE Sensitive     NITROFURANTOIN <=16 SENSITIVE Sensitive     TRIMETH/SULFA <=20 SENSITIVE Sensitive     AMPICILLIN/SULBACTAM 16 INTERMEDIATE Intermediate     PIP/TAZO <=4 SENSITIVE Sensitive     Extended ESBL NEGATIVE Sensitive     * >=100,000 COLONIES/mL KLEBSIELLA PNEUMONIAE   Proteus mirabilis - MIC*    AMPICILLIN <=2 SENSITIVE Sensitive     CEFAZOLIN <=4 SENSITIVE Sensitive     CEFTRIAXONE <=1 SENSITIVE Sensitive     CIPROFLOXACIN <=0.25 SENSITIVE Sensitive     GENTAMICIN <=1 SENSITIVE Sensitive     IMIPENEM 4 SENSITIVE Sensitive     NITROFURANTOIN 128 RESISTANT Resistant     TRIMETH/SULFA <=20 SENSITIVE Sensitive     AMPICILLIN/SULBACTAM <=2 SENSITIVE Sensitive     PIP/TAZO <=4 SENSITIVE Sensitive     * 20,000 COLONIES/mL PROTEUS MIRABILIS  MRSA PCR Screening     Status: None   Collection Time: 05/04/18  7:24 PM  Result Value Ref Range Status   MRSA by PCR NEGATIVE NEGATIVE Final    Comment:        The GeneXpert MRSA Assay (FDA approved for NASAL specimens only), is one component of a comprehensive MRSA colonization surveillance program. It is not intended to diagnose MRSA infection nor to guide or monitor treatment for MRSA infections. Performed at [facility], [place] [address], [place], [place] [address]      Labs: BNP (last 3 results) No results for  input(s): BNP in the last 8760 hours. Basic Metabolic Panel: Recent Labs  Lab 05/03/18 2113 05/04/18 0847 05/05/18 0316 05/06/18 0313 05/07/18 0356  NA 137 137 139 138 137  K 3.6  3.3* 3.3* 2.9* 3.7  CL 102 105 110 110 109  CO2 20* 21* 19* 18* 20*  GLUCOSE 339* 293* 140* 131* 116*  BUN 18 26* 26* 22* 17  CREATININE 1.29* 1.35* 1.15* 1.07* 0.98  CALCIUM 8.3* 7.2* 7.0* 6.8* 7.3*   Liver Function Tests: Recent Labs  Lab 05/03/18 2113 05/04/18 0847  AST 27 24  ALT 13* 12*  ALKPHOS 76 63  BILITOT 1.0 0.7  PROT 6.3* 5.4*  ALBUMIN 3.0* 2.5*   No results for input(s): LIPASE, AMYLASE in the last 168 hours. No results for input(s): AMMONIA in the last 168 hours. CBC: Recent Labs  Lab 05/03/18 2113 05/04/18 0847 05/05/18 0316 05/06/18 0313 05/07/18 0356  WBC 12.5* 13.6* 10.0 6.8 6.2  NEUTROABS 11.4* 12.2* 8.8*  --   --   HGB 12.1 10.5* 10.5* 9.9* 10.4*  HCT 38.9 31.7* 31.9* 29.4* 30.8*  MCV 92.2 89.3 88.1 88.3 87.3  PLT 154 134* 119* 128* 153   Cardiac Enzymes: No results for input(s): CKTOTAL, CKMB, CKMBINDEX, TROPONINI in the last 168 hours. BNP: Invalid input(s): POCBNP CBG: Recent Labs  Lab 05/06/18 0343 05/06/18 0730 05/06/18 1121 05/06/18 1608 05/06/18 2221  GLUCAP 118* 86 238* 285* 145*   D-Dimer No results for input(s): DDIMER in the last 72 hours. Hgb A1c No results for input(s): HGBA1C in the last 72 hours. Lipid Profile No results for input(s): CHOL, HDL, LDLCALC, TRIG, CHOLHDL, LDLDIRECT in the last 72 hours. Thyroid function studies No results for input(s): TSH, T4TOTAL, T3FREE, THYROIDAB in the last 72 hours.  Invalid input(s): FREET3 Anemia work up No results for input(s): VITAMINB12, FOLATE, FERRITIN, TIBC, IRON, RETICCTPCT in the last 72 hours. Urinalysis    Component Value Date/Time   COLORURINE YELLOW 05/03/2018 2128   APPEARANCEUR CLOUDY (A) 05/03/2018 2128   LABSPEC 1.013 05/03/2018 2128   PHURINE 5.0 05/03/2018 2128   GLUCOSEU >=500 (A) 05/03/2018 2128   HGBUR LARGE (A) 05/03/2018 2128   BILIRUBINUR NEGATIVE 05/03/2018 2128   KETONESUR 20 (A) 05/03/2018 2128   PROTEINUR 100 (A) 05/03/2018 2128    UROBILINOGEN 0.2 05/27/2014 1203   NITRITE POSITIVE (A) 05/03/2018 2128   LEUKOCYTESUR LARGE (A) 05/03/2018 2128   Sepsis Labs Invalid input(s): PROCALCITONIN,  WBC,  LACTICIDVEN Microbiology Recent Results (from the past 240 hour(s))  Culture, blood (Routine x 2)     Status: Abnormal   Collection Time: 05/03/18  9:18 PM  Result Value Ref Range Status   Specimen Description BLOOD LEFT WRIST  Final   Special Requests   Final    BOTTLES DRAWN AEROBIC AND ANAEROBIC Blood Culture results may not be optimal due to an inadequate volume of blood received in culture bottles   Culture  Setup Time   Final    GRAM NEGATIVE RODS IN BOTH AEROBIC AND ANAEROBIC BOTTLES CRITICAL RESULT CALLED TO, READ BACK BY AND VERIFIED WITH: Marcy Siren PharmD 10:45 05/04/18 ([email or web address]) Performed at [facility], [place] [address], [place], [place] [address]    Culture ESCHERICHIA COLI (A)  Final   Report Status 05/06/2018 FINAL  Final   Organism ID, Bacteria ESCHERICHIA COLI  Final      Susceptibility   Escherichia coli - MIC*    AMPICILLIN >=32 RESISTANT Resistant     CEFAZOLIN <=4  SENSITIVE Sensitive     CEFEPIME <=1 SENSITIVE Sensitive     CEFTAZIDIME <=1 SENSITIVE Sensitive     CEFTRIAXONE <=1 SENSITIVE Sensitive     CIPROFLOXACIN <=0.25 SENSITIVE Sensitive     GENTAMICIN <=1 SENSITIVE Sensitive     IMIPENEM <=0.25 SENSITIVE Sensitive     TRIMETH/SULFA <=20 SENSITIVE Sensitive     AMPICILLIN/SULBACTAM 16 INTERMEDIATE Intermediate     PIP/TAZO <=4 SENSITIVE Sensitive     Extended ESBL NEGATIVE Sensitive     * ESCHERICHIA COLI  Blood Culture ID Panel (Reflexed)     Status: Abnormal   Collection Time: 05/03/18  9:18 PM  Result Value Ref Range Status   Enterococcus species NOT DETECTED NOT DETECTED Final   Listeria monocytogenes NOT DETECTED NOT DETECTED Final   Staphylococcus species NOT DETECTED NOT DETECTED Final   Staphylococcus aureus NOT DETECTED NOT DETECTED Final   Streptococcus species NOT  DETECTED NOT DETECTED Final   Streptococcus agalactiae NOT DETECTED NOT DETECTED Final   Streptococcus pneumoniae NOT DETECTED NOT DETECTED Final   Streptococcus pyogenes NOT DETECTED NOT DETECTED Final   Acinetobacter baumannii NOT DETECTED NOT DETECTED Final   Enterobacteriaceae species DETECTED (A) NOT DETECTED Final    Comment: Enterobacteriaceae represent a large family of gram-negative bacteria, not a single organism. CRITICAL RESULT CALLED TO, READ BACK BY AND VERIFIED WITH: Marcy Siren PharmD 10:45 05/04/18 ([email or web address])    Enterobacter cloacae complex NOT DETECTED NOT DETECTED Final   Escherichia coli DETECTED (A) NOT DETECTED Final    Comment: CRITICAL RESULT CALLED TO, READ BACK BY AND VERIFIED WITH: Marcy Siren PharmD 10:45 05/04/18 ([email or web address])    Klebsiella oxytoca NOT DETECTED NOT DETECTED Final   Klebsiella pneumoniae NOT DETECTED NOT DETECTED Final   Proteus species NOT DETECTED NOT DETECTED Final   Serratia marcescens NOT DETECTED NOT DETECTED Final   Carbapenem resistance NOT DETECTED NOT DETECTED Final   Haemophilus influenzae NOT DETECTED NOT DETECTED Final   Neisseria meningitidis NOT DETECTED NOT DETECTED Final   Pseudomonas aeruginosa NOT DETECTED NOT DETECTED Final   Candida albicans NOT DETECTED NOT DETECTED Final   Candida glabrata NOT DETECTED NOT DETECTED Final   Candida krusei NOT DETECTED NOT DETECTED Final   Candida parapsilosis NOT DETECTED NOT DETECTED Final   Candida tropicalis NOT DETECTED NOT DETECTED Final    Comment: Performed at [facility], [place] [address], [place], [place] [address]  Urine culture     Status: Abnormal   Collection Time: 05/03/18  9:28 PM  Result Value Ref Range Status   Specimen Description URINE, CATHETERIZED  Final   Special Requests   Final    NONE Performed at [facility], [place] [address] [place], [place] [address]    Culture >=100,000 COLONIES/mL KLEBSIELLA PNEUMONIAE (A)  Final   Report Status 05/06/2018 FINAL   Final   Organism ID, Bacteria KLEBSIELLA PNEUMONIAE (A)  Final      Susceptibility   Klebsiella pneumoniae - MIC*    AMPICILLIN >=32 RESISTANT Resistant     CEFAZOLIN <=4 SENSITIVE Sensitive     CEFTRIAXONE <=1 SENSITIVE Sensitive     CIPROFLOXACIN <=0.25 SENSITIVE Sensitive     GENTAMICIN <=1 SENSITIVE Sensitive     IMIPENEM <=0.25 SENSITIVE Sensitive     NITROFURANTOIN <=16 SENSITIVE Sensitive     TRIMETH/SULFA <=20 SENSITIVE Sensitive     AMPICILLIN/SULBACTAM 16 INTERMEDIATE Intermediate     PIP/TAZO <=4 SENSITIVE Sensitive     Extended ESBL NEGATIVE Sensitive     * >=  100,000 COLONIES/mL KLEBSIELLA PNEUMONIAE  Culture, blood (Routine x 2)     Status: None (Preliminary result)   Collection Time: 05/03/18  9:53 PM  Result Value Ref Range Status   Specimen Description BLOOD RIGHT HAND  Final   Special Requests   Final    BOTTLES DRAWN AEROBIC AND ANAEROBIC Blood Culture results may not be optimal due to an inadequate volume of blood received in culture bottles   Culture  Setup Time   Final    GRAM NEGATIVE RODS IN BOTH AEROBIC AND ANAEROBIC BOTTLES CRITICAL VALUE NOTED.  VALUE IS CONSISTENT WITH PREVIOUSLY REPORTED AND CALLED VALUE. Performed at [facility], [place] [address], [place], [place] [address]    Culture GRAM NEGATIVE RODS  Final   Report Status PENDING  Incomplete  Urine Culture     Status: Abnormal   Collection Time: 05/04/18  2:40 AM  Result Value Ref Range Status   Specimen Description   Final    URINE, CATHETERIZED CYSTOSCOPE Performed at [place] [address] [place], [place] [address]    Special Requests   Final    NONE Performed at [facility], [place] [address] [place], [place] [address]    Culture (A)  Final    >=100,000 COLONIES/mL KLEBSIELLA PNEUMONIAE 20,000 COLONIES/mL PROTEUS MIRABILIS    Report Status 05/07/2018 FINAL  Final   Organism ID, Bacteria KLEBSIELLA PNEUMONIAE (A)  Final   Organism ID,  Bacteria PROTEUS MIRABILIS (A)  Final      Susceptibility   Klebsiella pneumoniae - MIC*    AMPICILLIN >=32 RESISTANT Resistant     CEFAZOLIN <=4 SENSITIVE Sensitive     CEFTRIAXONE <=1 SENSITIVE Sensitive     CIPROFLOXACIN <=0.25 SENSITIVE Sensitive     GENTAMICIN <=1 SENSITIVE Sensitive     IMIPENEM <=0.25 SENSITIVE Sensitive     NITROFURANTOIN <=16 SENSITIVE Sensitive     TRIMETH/SULFA <=20 SENSITIVE Sensitive     AMPICILLIN/SULBACTAM 16 INTERMEDIATE Intermediate     PIP/TAZO <=4 SENSITIVE Sensitive     Extended ESBL NEGATIVE Sensitive     * >=100,000 COLONIES/mL KLEBSIELLA PNEUMONIAE   Proteus mirabilis - MIC*    AMPICILLIN <=2 SENSITIVE Sensitive     CEFAZOLIN <=4 SENSITIVE Sensitive     CEFTRIAXONE <=1 SENSITIVE Sensitive     CIPROFLOXACIN <=0.25 SENSITIVE Sensitive     GENTAMICIN <=1 SENSITIVE Sensitive     IMIPENEM 4 SENSITIVE Sensitive     NITROFURANTOIN 128 RESISTANT Resistant     TRIMETH/SULFA <=20 SENSITIVE Sensitive     AMPICILLIN/SULBACTAM <=2 SENSITIVE Sensitive     PIP/TAZO <=4 SENSITIVE Sensitive     * 20,000 COLONIES/mL PROTEUS MIRABILIS  MRSA PCR Screening     Status: None   Collection Time: 05/04/18  7:24 PM  Result Value Ref Range Status   MRSA by PCR NEGATIVE NEGATIVE Final    Comment:        The GeneXpert MRSA Assay (FDA approved for NASAL specimens only), is one component of a comprehensive MRSA colonization surveillance program. It is not intended to diagnose MRSA infection nor to guide or monitor treatment for MRSA infections. Performed at [facility], [place] [address] [place], [place] [address]      Time coordinating discharge: 38 minutes  SIGNED:   Georgette Shell, MD  [facility] 05/07/2018, 12:42 PM Pager   If 7PM-7AM, please contact night-coverage [email or web address] Password [email or web address]

## 2018-05-07 NOTE — Progress Notes (Signed)
Physical Therapy Treatment Patient Details Name: [name] MRN: [id number] DOB: 1946/04/04 Today's Date: 05/07/2018    History of Present Illness Pt admitted with sepsis, UTI, ARF and AMS and with hx of DM, COPD and Chrohn's.    PT Comments    Pt is progressing with mobility, she was able to ambulate 30' with RW, distance limited by pain and fatigue. Overall she tolerated increased activity level today and has improved cognition.    Follow Up Recommendations  SNF     Equipment Recommendations  None recommended by PT    Recommendations for Other Services OT consult     Precautions / Restrictions Precautions Precautions: Fall Precaution Comments: pt reports 3 falls in past 1 year, tripped on steps Restrictions Weight Bearing Restrictions: No    Mobility  Bed Mobility   Bed Mobility: Supine to Sit     Supine to sit: Mod assist     General bed mobility comments: assist to raise trunk and pivot hips to EOB  Transfers Overall transfer level: Needs assistance Equipment used: Rolling walker (2 wheeled) Transfers: Sit to/from [facility] Sit to Stand: Min guard Stand pivot transfers: Min guard       General transfer comment: VCs hand placement, min/guard safety  Ambulation/Gait Ambulation/Gait assistance: Min guard Gait Distance (Feet): 30 Feet Assistive device: Rolling walker (2 wheeled) Gait Pattern/deviations: Step-through pattern;Decreased stride length Gait velocity: decr   General Gait Details: distance limited by abdominal pain and fatigue, no loss of balance    Stairs             Wheelchair Mobility    Modified Rankin (Stroke Patients Only)       Balance Overall balance assessment: Needs assistance Sitting-balance support: Bilateral upper extremity supported;Feet supported Sitting balance-Leahy Scale: Good       Standing balance-Leahy Scale: Fair                              Cognition  Arousal/Alertness: Awake/alert Behavior During Therapy: WFL for tasks assessed/performed Overall Cognitive Status: Within Functional Limits for tasks assessed                                        Exercises General Exercises - Upper Extremity Shoulder Flexion: AROM;Both;10 reps;Seated General Exercises - Lower Extremity Ankle Circles/Pumps: AROM;Both;10 reps;Supine Short Arc Quad: AROM;Both;5 reps;Supine    General Comments        Pertinent Vitals/Pain Pain Score: 8  Pain Location: abdomen  Pain Descriptors / Indicators: Aching Pain Intervention(s): Limited activity within patient's tolerance;Monitored during session;Premedicated before session;Patient requesting pain meds-RN notified    Home Living                      Prior Function            PT Goals (current goals can now be found in the care plan section) Acute Rehab PT Goals Patient Stated Goal: take care of her roses PT Goal Formulation: With patient Time For Goal Achievement: 05/19/18 Potential to Achieve Goals: Fair Progress towards PT goals: Progressing toward goals    Frequency    Min 3X/week      PT Plan Current plan remains appropriate    Co-evaluation              AM-PAC PT "6 Clicks" Daily Activity  Outcome Measure  Difficulty turning over in bed (including adjusting bedclothes, sheets and blankets)?: Unable Difficulty moving from lying on back to sitting on the side of the bed? : Unable Difficulty sitting down on and standing up from a chair with arms (e.g., wheelchair, bedside commode, etc,.)?: Unable Help needed moving to and from a bed to chair (including a wheelchair)?: A Little Help needed walking in hospital room?: A Little Help needed climbing 3-5 steps with a railing? : Total 6 Click Score: 10    End of Session Equipment Utilized During Treatment: Gait belt Activity Tolerance: Patient limited by fatigue;Patient limited by pain Patient left: with  call bell/phone within reach;in chair;with family/visitor present Nurse Communication: Mobility status PT Visit Diagnosis: Difficulty in walking, not elsewhere classified (R26.2);Pain;Muscle weakness (generalized) (M62.81) Pain - part of body: (back)     Time: 8841-6606 PT Time Calculation (min) (ACUTE ONLY): 27 min  Charges:  $Gait Training: 8-22 mins $Therapeutic Activity: 8-22 mins                    G Codes:      [name] PT 05/07/2018  [phone]    [name] 05/07/2018, 10:29 AM

## 2018-05-07 NOTE — Progress Notes (Signed)
Discharge instructions reviewed with patient and daughter. Both verbalized understanding. Patient discharged via private vehicle. 

## 2018-05-07 NOTE — Progress Notes (Signed)
3 Days Post-Op Subjective: NAE. C/o intermittent lower abdominal pain, likely related to her stent.  Denies N/V.  No voiding complaints.   Objective: Vital signs in last 24 hours: Temp:  [98.4 F (36.9 C)-100 F (37.8 C)] 99.8 F (37.7 C) (06/21 0530) Pulse Rate:  [73-77] 73 (06/21 0530) Resp:  [18-24] 20 (06/21 0530) BP: (109-139)/(31-81) 120/59 (06/21 0530) SpO2:  [98 %-100 %] 99 % (06/21 0530)  Intake/Output from previous day: 06/20 0701 - 06/21 0700 In: 960 [P.O.:960] Out: 300 [Urine:300]  Intake/Output this shift: Total I/O In: 240 [P.O.:240] Out: -   Physical Exam:  General: Alert and oriented CV: RRR, palpable distal pulses Lungs: CTAB, equal chest rise Abdomen: Soft, NTND, no rebound or guarding Ext: NT, No erythema  Lab Results: Recent Labs    05/05/18 0316 05/06/18 0313 05/07/18 0356  HGB 10.5* 9.9* 10.4*  HCT 31.9* 29.4* 30.8*   BMET Recent Labs    05/06/18 0313 05/07/18 0356  NA 138 137  K 2.9* 3.7  CL 110 109  CO2 18* 20*  GLUCOSE 131* 116*  BUN 22* 17  CREATININE 1.07* 0.98  CALCIUM 6.8* 7.3*     Studies/Results: No results found.  Assessment/Plan: Postop day 3 status post cystoscopy with right JJ stent placement secondary to an obstructing right distal ureteral stone Sepsis secondary to UTI-- Urine cx grew Klebsiella and Proteus  Acute kidney injury-resolving  -PO abx for a total of 14 days.  Will arrange OP surgery to address her right ureteral stone    LOS: 3 days   Ellison Hughs, MD [facility] Pager: [phone]  05/07/2018, 11:30 AM

## 2018-05-07 NOTE — Progress Notes (Signed)
CSW consulted for SNF. Patient's RN reported that patient will dc home. CSW signing off, no other needs identified at this time.  Abundio Miu, [place] Social Worker [facility] Cell#: [phone]

## 2018-05-07 NOTE — Care Management Important Message (Signed)
Important Message  Patient Details  Name: [name] MRN: [id number] Date of Birth: [birth year]   Medicare Important Message Given:  Yes    Kerin Salen 05/07/2018, 11:17 AMImportant Message  Patient Details  Name: [name] MRN: [id number] Date of Birth: [birth year]   Medicare Important Message Given:  Yes    Kerin Salen 05/07/2018, 11:17 AM

## 2018-05-09 LAB — CULTURE, BLOOD (ROUTINE X 2)

## 2018-05-11 ENCOUNTER — Other Ambulatory Visit: Payer: Self-pay | Admitting: Urology

## 2018-05-11 LAB — GLUCOSE, CAPILLARY
Glucose-Capillary: 126 mg/dL — ABNORMAL HIGH (ref 70–99)
Glucose-Capillary: 223 mg/dL — ABNORMAL HIGH (ref 70–99)

## 2018-05-13 ENCOUNTER — Other Ambulatory Visit: Payer: Self-pay

## 2018-05-13 ENCOUNTER — Encounter (HOSPITAL_BASED_OUTPATIENT_CLINIC_OR_DEPARTMENT_OTHER): Payer: Self-pay | Admitting: *Deleted

## 2018-05-13 NOTE — Progress Notes (Addendum)
Spoke w/ pt via phone for pre-op interview.  Npo after mn.  Arrive at [facility].  Current lab results (dated 05-07-2018) and ekg in chart and epic.  Will take synthroid and gabapentin am dos w/ sips of water and if needed take prn rx's.  Pt daughter , Joelene Millin , should go back with pt to pre-op.

## 2018-05-18 NOTE — H&P (Signed)
Urology Preoperative H&P   Chief Complaint: Right ureteral stone  History of Present Illness: [name] is a 72 y.o. female with a 6 mm right distal ureteral stone.  She was stented on 05/04/18 due to urosepsis from right ureteral obstruction.  She denies N/V/F/C.  She does report intermittent right flank pain and bladder spasms, likely related to her stent.  She is here today for definitive stone treatment.    Past Medical History:  Diagnosis Date  . Arthritis   . Bilateral lower extremity edema   . Flank pain   . Full dentures   . History of cervical cancer    11/ 1994  Stage IB  s/p  high dose radiation brachytherapy @ [facility] 01/ 1995--- per pt no recurrence  . History of chronic bronchitis   . History of hyperthyroidism    d 03/ 2011---due to grave's disease--- s/p  RAI treatment 04/ 2011  . History of sepsis 05/03/2018   due to UTI with klebsiella/ pyelonephritis/ ureteral obstruction cause by stone  . Hyperlipidemia   . Hypothyroidism, postradioiodine therapy    endocrinologist--  dr Loanne Drilling--  dx graves disease and s/p RAI i131 treatement 4/ 2011  . Nauseated   . Oral [name] 05/03/2018  . Type 2 diabetes mellitus treated with insulin ([place])    FOLLOWED BY PCP  . Urgency of urination   . Wears glasses     Past Surgical History:  Procedure Laterality Date  . APPENDECTOMY    . CATARACT EXTRACTION W/ INTRAOCULAR LENS  IMPLANT, BILATERAL  2004  approx.  . CYSTOSCOPY WITH STENT PLACEMENT Right 05/04/2018   Procedure: CYSTOSCOPY WITH STENT PLACEMENT AND RETROGRADE PYELOGRAM;  Surgeon: Ceasar Mons, MD;  Location: [facility] ORS;  Service: Urology;  Laterality: Right;  . EXCISION MASS LEFT CHEST WALL  09-20-2007   dr Grandville Silos  [facility]  . REVISION TOTAL HIP ARTHROPLASTY Left early 2000s  . TANDEM RING INSERTION  1995   dr Aldean Ast @ [facility]   EUA w/ tandem placement in ovid  (for direct high dose radiation brachytherapy , cervical cancer)  . TOTAL HIP ARTHROPLASTY Left  1990s  . TRANSTHORACIC ECHOCARDIOGRAM  08/07/2017   mild focal basal hypertrophy of the septum,  ef 81-27%, grade 1 diastolic dysfunction/  atrial septum with lipomatous hypertrophy/  trivial PR    Allergies:  Allergies  Allergen Reactions  . Codeine Nausea Only    Family History  Problem Relation Age of Onset  . Emphysema Father   . Emphysema Sister   . Emphysema Brother   . Cancer Mother        Skin Cancer  . Thyroid disease Mother        uncertain type     Social History:  reports that she quit smoking about 36 years ago. Her smoking use included cigarettes. She quit after 5.00 years of use. She has never used smokeless tobacco. She reports that she does not drink alcohol or use drugs.  ROS: A complete review of systems was performed.  All systems are negative except for pertinent findings as noted.  Physical Exam:  Vital signs in last 24 hours:   Constitutional:  Alert and oriented, No acute distress Cardiovascular: Regular rate and rhythm, No JVD Respiratory: Normal respiratory effort, Lungs clear bilaterally GI: Abdomen is soft, nontender, nondistended, no abdominal masses GU: No CVA tenderness Lymphatic: No lymphadenopathy Neurologic: Grossly intact, no focal deficits Psychiatric: Normal mood and affect  Laboratory Data:  No results for input(s): WBC,  HGB, HCT, PLT in the last 72 hours.  No results for input(s): NA, K, CL, GLUCOSE, BUN, CALCIUM, CREATININE in the last 72 hours.  Invalid input(s): CO3   No results found for this or any previous visit (from the past 24 hour(s)). No results found for this or any previous visit (from the past 240 hour(s)).  Renal Function: No results for input(s): CREATININE in the last 168 hours. Estimated Creatinine Clearance: 49.6 mL/min (by C-G formula based on SCr of 0.98 mg/dL).  Radiologic Imaging: No results found.  I independently reviewed the above imaging studies.  Assessment and Plan [name] is a 72  y.o. female with a 6 mm right distal ureteral stone  .The risks, benefits and alternatives of cystoscopy with RIGHT ureteroscopy, laser lithotripsy and ureteral stent placement was discussed the patient.  Risks included, but are not limited to: bleeding, urinary tract infection, ureteral injury/avulsion, ureteral stricture formation, retained stone fragments, the possibility that multiple surgeries may be required to treat the stone(s), MI, stroke, PE and the inherent risks of general anesthesia.  The patient voices understanding and wishes to proceed.       [name] Hughs, MD 05/18/2018, 8:06 PM  [facility] Pager: [phone]

## 2018-05-19 ENCOUNTER — Ambulatory Visit (HOSPITAL_BASED_OUTPATIENT_CLINIC_OR_DEPARTMENT_OTHER)
Admission: RE | Admit: 2018-05-19 | Discharge: 2018-05-19 | Disposition: A | Discharge status: Home / Self Care | Payer: Medicare HMO | Source: Ambulatory Visit | Attending: Urology | Admitting: Urology

## 2018-05-19 ENCOUNTER — Ambulatory Visit (HOSPITAL_BASED_OUTPATIENT_CLINIC_OR_DEPARTMENT_OTHER): Payer: Medicare HMO | Admitting: Anesthesiology

## 2018-05-19 ENCOUNTER — Encounter (HOSPITAL_BASED_OUTPATIENT_CLINIC_OR_DEPARTMENT_OTHER)
Admission: RE | Disposition: A | Discharge status: Home / Self Care | Payer: Self-pay | Source: Ambulatory Visit | Attending: Urology

## 2018-05-19 ENCOUNTER — Encounter (HOSPITAL_BASED_OUTPATIENT_CLINIC_OR_DEPARTMENT_OTHER): Payer: Self-pay | Admitting: Anesthesiology

## 2018-05-19 DIAGNOSIS — Z794 Long term (current) use of insulin: Secondary | ICD-10-CM | POA: Diagnosis not present

## 2018-05-19 DIAGNOSIS — B370 Candidal stomatitis: Secondary | ICD-10-CM | POA: Insufficient documentation

## 2018-05-19 DIAGNOSIS — Z96642 Presence of left artificial hip joint: Secondary | ICD-10-CM | POA: Diagnosis not present

## 2018-05-19 DIAGNOSIS — Z9842 Cataract extraction status, left eye: Secondary | ICD-10-CM | POA: Insufficient documentation

## 2018-05-19 DIAGNOSIS — E785 Hyperlipidemia, unspecified: Secondary | ICD-10-CM | POA: Diagnosis not present

## 2018-05-19 DIAGNOSIS — Z8349 Family history of other endocrine, nutritional and metabolic diseases: Secondary | ICD-10-CM | POA: Diagnosis not present

## 2018-05-19 DIAGNOSIS — Z9049 Acquired absence of other specified parts of digestive tract: Secondary | ICD-10-CM | POA: Insufficient documentation

## 2018-05-19 DIAGNOSIS — Z9841 Cataract extraction status, right eye: Secondary | ICD-10-CM | POA: Diagnosis not present

## 2018-05-19 DIAGNOSIS — N201 Calculus of ureter: Secondary | ICD-10-CM | POA: Insufficient documentation

## 2018-05-19 DIAGNOSIS — Z79890 Hormone replacement therapy (postmenopausal): Secondary | ICD-10-CM | POA: Diagnosis not present

## 2018-05-19 DIAGNOSIS — Z87891 Personal history of nicotine dependence: Secondary | ICD-10-CM | POA: Diagnosis not present

## 2018-05-19 DIAGNOSIS — E890 Postprocedural hypothyroidism: Secondary | ICD-10-CM | POA: Diagnosis not present

## 2018-05-19 DIAGNOSIS — Z79899 Other long term (current) drug therapy: Secondary | ICD-10-CM | POA: Diagnosis not present

## 2018-05-19 DIAGNOSIS — Z885 Allergy status to narcotic agent status: Secondary | ICD-10-CM | POA: Diagnosis not present

## 2018-05-19 DIAGNOSIS — Z961 Presence of intraocular lens: Secondary | ICD-10-CM | POA: Diagnosis not present

## 2018-05-19 HISTORY — DX: Unspecified osteoarthritis, unspecified site: M19.90

## 2018-05-19 HISTORY — DX: Type 2 diabetes mellitus without complications: E11.9

## 2018-05-19 HISTORY — DX: Complete loss of teeth, unspecified cause, unspecified class: K08.109

## 2018-05-19 HISTORY — DX: Unspecified abdominal pain: R10.9

## 2018-05-19 HISTORY — DX: Type 2 diabetes mellitus without complications: Z79.4

## 2018-05-19 HISTORY — DX: Candidal stomatitis: B37.0

## 2018-05-19 HISTORY — PX: CYSTOSCOPY/URETEROSCOPY/HOLMIUM LASER/STENT PLACEMENT: SHX6546

## 2018-05-19 HISTORY — DX: Personal history of other endocrine, nutritional and metabolic disease: Z86.39

## 2018-05-19 HISTORY — DX: Postprocedural hypothyroidism: E89.0

## 2018-05-19 HISTORY — DX: Urgency of urination: R39.15

## 2018-05-19 HISTORY — DX: Presence of dental prosthetic device (complete) (partial): Z97.2

## 2018-05-19 HISTORY — DX: Personal history of malignant neoplasm of cervix uteri: Z85.41

## 2018-05-19 HISTORY — DX: Nausea: R11.0

## 2018-05-19 HISTORY — DX: Presence of spectacles and contact lenses: Z97.3

## 2018-05-19 HISTORY — DX: Personal history of other infectious and parasitic diseases: Z86.19

## 2018-05-19 HISTORY — DX: Personal history of other diseases of the respiratory system: Z87.09

## 2018-05-19 HISTORY — DX: Localized edema: R60.0

## 2018-05-19 HISTORY — DX: Hyperlipidemia, unspecified: E78.5

## 2018-05-19 LAB — GLUCOSE, CAPILLARY
Glucose-Capillary: 104 mg/dL — ABNORMAL HIGH (ref 70–99)
Glucose-Capillary: 138 mg/dL — ABNORMAL HIGH (ref 70–99)

## 2018-05-19 SURGERY — CYSTOSCOPY/URETEROSCOPY/HOLMIUM LASER/STENT PLACEMENT
Anesthesia: General | Site: Renal | Laterality: Right

## 2018-05-19 MED ORDER — ONDANSETRON HCL 4 MG/2ML IJ SOLN
INTRAMUSCULAR | Status: DC | PRN
  Administered 2018-05-19: 4 mg via INTRAVENOUS

## 2018-05-19 MED ORDER — PROPOFOL 10 MG/ML IV BOLUS
INTRAVENOUS | Status: DC | PRN
  Administered 2018-05-19: 120 mg via INTRAVENOUS

## 2018-05-19 MED ORDER — WHITE PETROLATUM EX OINT
TOPICAL_OINTMENT | CUTANEOUS | Status: AC
  Filled 2018-05-19: qty 5

## 2018-05-19 MED ORDER — ONDANSETRON HCL 4 MG PO TABS: 4.0000 mg | ORAL_TABLET | Freq: Every day | ORAL | 1 refills | Status: DC | PRN

## 2018-05-19 MED ORDER — LIDOCAINE 2% (20 MG/ML) 5 ML SYRINGE
INTRAMUSCULAR | Status: DC | PRN
  Administered 2018-05-19: 60 mg via INTRAVENOUS

## 2018-05-19 MED ORDER — DEXAMETHASONE SODIUM PHOSPHATE 10 MG/ML IJ SOLN
INTRAMUSCULAR | Status: AC
  Filled 2018-05-19: qty 1

## 2018-05-19 MED ORDER — IOHEXOL 300 MG/ML  SOLN: INTRAMUSCULAR | Status: DC | PRN

## 2018-05-19 MED ORDER — PROPOFOL 10 MG/ML IV BOLUS
INTRAVENOUS | Status: AC
  Filled 2018-05-19: qty 40

## 2018-05-19 MED ORDER — ACETAMINOPHEN 10 MG/ML IV SOLN
1000.0000 mg | Freq: Once | INTRAVENOUS | Status: DC | PRN
  Filled 2018-05-19: qty 100

## 2018-05-19 MED ORDER — LIDOCAINE 2% (20 MG/ML) 5 ML SYRINGE
INTRAMUSCULAR | Status: AC
  Filled 2018-05-19: qty 5

## 2018-05-19 MED ORDER — FENTANYL CITRATE (PF) 100 MCG/2ML IJ SOLN
INTRAMUSCULAR | Status: DC | PRN
  Administered 2018-05-19 (×2): 25 ug via INTRAVENOUS

## 2018-05-19 MED ORDER — PROMETHAZINE HCL 25 MG/ML IJ SOLN
6.2500 mg | INTRAMUSCULAR | Status: DC | PRN
  Filled 2018-05-19: qty 1

## 2018-05-19 MED ORDER — KETOROLAC TROMETHAMINE 30 MG/ML IJ SOLN
INTRAMUSCULAR | Status: DC | PRN
  Administered 2018-05-19: 30 mg via INTRAVENOUS

## 2018-05-19 MED ORDER — CIPROFLOXACIN IN D5W 400 MG/200ML IV SOLN
400.0000 mg | Freq: Once | INTRAVENOUS | Status: AC
  Administered 2018-05-19: 400 mg via INTRAVENOUS
  Filled 2018-05-19: qty 200

## 2018-05-19 MED ORDER — FENTANYL CITRATE (PF) 100 MCG/2ML IJ SOLN
25.0000 ug | INTRAMUSCULAR | Status: DC | PRN
  Filled 2018-05-19: qty 1

## 2018-05-19 MED ORDER — HYDROCODONE-ACETAMINOPHEN 5-325 MG PO TABS: 1.0000 | ORAL_TABLET | ORAL | 0 refills | Status: DC | PRN

## 2018-05-19 MED ORDER — CIPROFLOXACIN IN D5W 400 MG/200ML IV SOLN
INTRAVENOUS | Status: AC
  Filled 2018-05-19: qty 200

## 2018-05-19 MED ORDER — DEXAMETHASONE SODIUM PHOSPHATE 10 MG/ML IJ SOLN
INTRAMUSCULAR | Status: DC | PRN
  Administered 2018-05-19: 5 mg via INTRAVENOUS

## 2018-05-19 MED ORDER — ONDANSETRON HCL 4 MG/2ML IJ SOLN
INTRAMUSCULAR | Status: AC
  Filled 2018-05-19: qty 2

## 2018-05-19 MED ORDER — SODIUM CHLORIDE 0.9 % IR SOLN
Status: DC | PRN
  Administered 2018-05-19: 3000 mL

## 2018-05-19 MED ORDER — FENTANYL CITRATE (PF) 100 MCG/2ML IJ SOLN
INTRAMUSCULAR | Status: AC
  Filled 2018-05-19: qty 2

## 2018-05-19 MED ORDER — LACTATED RINGERS IV SOLN
INTRAVENOUS | Status: DC
  Administered 2018-05-19: 1000 mL via INTRAVENOUS
  Administered 2018-05-19: 10:00:00 via INTRAVENOUS
  Filled 2018-05-19: qty 1000

## 2018-05-19 MED ORDER — KETOROLAC TROMETHAMINE 30 MG/ML IJ SOLN
INTRAMUSCULAR | Status: AC
  Filled 2018-05-19: qty 1

## 2018-05-19 MED ORDER — MIDAZOLAM HCL 2 MG/2ML IJ SOLN
INTRAMUSCULAR | Status: AC
  Filled 2018-05-19: qty 2

## 2018-05-19 SURGICAL SUPPLY — 31 items
APL SKNCLS STERI-STRIP NONHPOA (GAUZE/BANDAGES/DRESSINGS)
BAG DRAIN URO-CYSTO SKYTR STRL (DRAIN) ×2 IMPLANT
BAG DRN UROCATH (DRAIN) ×1
BASKET STONE 1.7 NGAGE (UROLOGICAL SUPPLIES) IMPLANT
BASKET ZERO TIP NITINOL 2.4FR (BASKET) ×2 IMPLANT
BENZOIN TINCTURE PRP APPL 2/3 (GAUZE/BANDAGES/DRESSINGS) IMPLANT
BSKT STON RTRVL ZERO TP 2.4FR (BASKET) ×1
CATH URET 5FR 28IN OPEN ENDED (CATHETERS) IMPLANT
CLOTH BEACON ORANGE TIMEOUT ST (SAFETY) ×2 IMPLANT
FIBER LASER FLEXIVA 365 (UROLOGICAL SUPPLIES) IMPLANT
FIBER LASER TRAC TIP (UROLOGICAL SUPPLIES) ×1 IMPLANT
GLOVE BIO SURGEON STRL SZ7.5 (GLOVE) ×2 IMPLANT
GLOVE BIOGEL PI IND STRL 8.5 (GLOVE) IMPLANT
GLOVE BIOGEL PI INDICATOR 8.5 (GLOVE) ×1
GLOVE INDICATOR 8.5 STRL (GLOVE) ×1 IMPLANT
GOWN STRL REUS W/TWL XL LVL3 (GOWN DISPOSABLE) ×3 IMPLANT
GUIDEWIRE STR DUAL SENSOR (WIRE) IMPLANT
GUIDEWIRE ZIPWRE .038 STRAIGHT (WIRE) ×3 IMPLANT
INFUSOR MANOMETER BAG 3000ML (MISCELLANEOUS) ×1 IMPLANT
IV NS 1000ML (IV SOLUTION)
IV NS 1000ML BAXH (IV SOLUTION) IMPLANT
IV NS IRRIG 3000ML ARTHROMATIC (IV SOLUTION) ×2 IMPLANT
KIT TURNOVER CYSTO (KITS) ×2 IMPLANT
MANIFOLD NEPTUNE II (INSTRUMENTS) ×2 IMPLANT
NS IRRIG 500ML POUR BTL (IV SOLUTION) ×2 IMPLANT
PACK CYSTO (CUSTOM PROCEDURE TRAY) ×2 IMPLANT
STENT URET 6FRX24 CONTOUR (STENTS) ×1 IMPLANT
STRIP CLOSURE SKIN 1/2X4 (GAUZE/BANDAGES/DRESSINGS) IMPLANT
SYR 10ML LL (SYRINGE) ×2 IMPLANT
TUBE CONNECTING 12X1/4 (SUCTIONS) ×1 IMPLANT
TUBING UROLOGY SET (TUBING) IMPLANT

## 2018-05-19 NOTE — Anesthesia Postprocedure Evaluation (Signed)
Anesthesia Post Note  Patient: [name]  Procedure(s) Performed: CYSTOSCOPY, URETEROSCOPY/HOLMIUM LASER, STONE BASKETRY/ STENT EXCHANGE (Right Renal)     Patient location during evaluation: PACU Anesthesia Type: General Level of consciousness: awake and alert Pain management: pain level controlled Vital Signs Assessment: post-procedure vital signs reviewed and stable Respiratory status: spontaneous breathing, nonlabored ventilation, respiratory function stable and patient connected to nasal cannula oxygen Cardiovascular status: blood pressure returned to baseline and stable Postop Assessment: no apparent nausea or vomiting Anesthetic complications: no    Last Vitals:  Vitals:   05/19/18 1158 05/19/18 1200  BP:  (!) 157/63  Pulse: 66 68  Resp: 16 19  Temp:    SpO2: 94% 94%    Last Pain:  Vitals:   05/19/18 1200  TempSrc:   PainSc: 0-No pain                 Ziyad Dyar S

## 2018-05-19 NOTE — Anesthesia Preprocedure Evaluation (Addendum)
Anesthesia Evaluation  Patient identified by MRN, date of birth, ID band Patient awake    Reviewed: Allergy & Precautions, NPO status , Patient's Chart, lab work & pertinent test results  Airway Mallampati: II  TM Distance: >3 FB Neck ROM: Full    Dental no notable dental hx. (+) Edentulous Upper, Edentulous Lower,    Pulmonary neg pulmonary ROS, COPD, former smoker,    Pulmonary exam normal breath sounds clear to auscultation       Cardiovascular hypertension, Pt. on medications Normal cardiovascular exam Rhythm:Regular Rate:Normal     Neuro/Psych negative neurological ROS  negative psych ROS   GI/Hepatic negative GI ROS, Neg liver ROS,   Endo/Other  diabetes, Well Controlled, Type 2, Insulin DependentHypothyroidism   Renal/GU negative Renal ROSRt renal calculus  negative genitourinary   Musculoskeletal negative musculoskeletal ROS (+) Arthritis , Osteoarthritis,    Abdominal   Peds negative pediatric ROS (+)  Hematology negative hematology ROS (+)   Anesthesia Other Findings   Reproductive/Obstetrics negative OB ROS                           Anesthesia Physical Anesthesia Plan  ASA: III  Anesthesia Plan: General   Post-op Pain Management:    Induction: Intravenous  PONV Risk Score and Plan: 3 and Ondansetron and Treatment may vary due to age or medical condition  Airway Management Planned: LMA  Additional Equipment:   Intra-op Plan:   Post-operative Plan:   Informed Consent: I have reviewed the patients History and Physical, chart, labs and discussed the procedure including the risks, benefits and alternatives for the proposed anesthesia with the patient or authorized representative who has indicated his/her understanding and acceptance.   Dental advisory given  Plan Discussed with: CRNA and Surgeon  Anesthesia Plan Comments:         Anesthesia Quick  Evaluation

## 2018-05-19 NOTE — Discharge Instructions (Signed)
?  Post Anesthesia Home Care Instructions ? ?Activity: ?Get plenty of rest for the remainder of the day. A responsible individual must stay with you for 24 hours following the procedure.  ?For the next 24 hours, DO NOT: ?-Drive a car ?-Operate machinery ?-Drink alcoholic beverages ?-Take any medication unless instructed by your physician ?-Make any legal decisions or sign important papers. ? ?Meals: ?Start with liquid foods such as gelatin or soup. Progress to regular foods as tolerated. Avoid greasy, spicy, heavy foods. If nausea and/or vomiting occur, drink only clear liquids until the nausea and/or vomiting subsides. Call your physician if vomiting continues. ? ?Special Instructions/Symptoms: ?Your throat may feel dry or sore from the anesthesia or the breathing tube placed in your throat during surgery. If this causes discomfort, gargle with warm salt water. The discomfort should disappear within 24 hours. ? ? ?    ?

## 2018-05-19 NOTE — Transfer of Care (Signed)
Immediate Anesthesia Transfer of Care Note  Patient: [name]  Procedure(s) Performed: CYSTOSCOPY, URETEROSCOPY/HOLMIUM LASER, STONE BASKETRY/ STENT EXCHANGE (Right Renal)  Patient Location: PACU  Anesthesia Type:General  Level of Consciousness: awake, alert , oriented and patient cooperative  Airway & Oxygen Therapy: Patient Spontanous Breathing and Patient connected to nasal cannula oxygen  Post-op Assessment: Report given to RN and Post -op Vital signs reviewed and stable  Post vital signs: Reviewed and stable  Last Vitals:  Vitals Value Taken Time  BP 164/79 05/19/2018 11:14 AM  Temp    Pulse 73 05/19/2018 11:15 AM  Resp 10 05/19/2018 11:15 AM  SpO2 99 % 05/19/2018 11:15 AM  Vitals shown include unvalidated device data.  Last Pain:  Vitals:   05/19/18 0858  TempSrc:   PainSc: 0-No pain      Patients Stated Pain Goal: 9 (87/19/94 1290)  Complications: No apparent anesthesia complications

## 2018-05-19 NOTE — Op Note (Signed)
Operative Note  Preoperative diagnosis:  1.  6 mm right mid ureteral stone  Postoperative diagnosis: 1.  Same  Procedure(s): 1.  Cystoscopy 2.  Right JJ stent exchange 3.  Right ureteroscopy 4.  Right holmium laser lithotripsy  Surgeon: Ellison Hughs, MD  Assistants: None  Anesthesia: General  Complications: None  EBL: Less than 5 mL  Specimens: 1.  Previously placed right JJ stent was removed intact, inspected and discarded 2.  Right ureteral stone  Drains/Catheters: 1.  Right 6 French by 24 cm JJ stent without tether  Intraoperative findings:   1. 6 mm right mid ureteral stone  Indication:  [name] is a 72 y.o. female with a 6 mm right distal ureteral stone.  She was stented on 05/04/18 due to urosepsis from right ureteral obstruction.  She denies N/V/F/C.  She does report intermittent right flank pain and bladder spasms, likely related to her stent.  She is here today for definitive stone treatment.  Description of procedure:  After informed consent was obtained, the patient was brought to the operating room and general LMA anesthesia was administered. The patient was then placed in the dorsolithotomy position and prepped and draped in usual sterile fashion. A timeout was performed. A 23 French rigid cystoscope was then inserted into the urethral meatus and advanced into the bladder under direct vision. A complete bladder survey revealed no intravesical pathology.  Her previously placed right JJ stent was grasped at its distal curl and retracted to the urethral meatus.  A Glidewire was then used to intubate the lumen of the stent, which was then advanced up to the right renal pelvis, under fluoroscopic guidance.  The previously placed stent was removed over the wire intact, inspected and discarded.  The rigid cystoscope was then exchanged for a semirigid ureteroscope, which was advanced up to the level of her obstructing right mid ureteral stone.  A 200 m  holmium laser was then used to fracture the stone into several smaller fragments.  A nitinol tipless basket was then used to extract all stone fragments from the lumen of the right ureter.  The semirigid ureteroscope was then removed.  A 6 French by 24 cm JJ stent was then advanced over the wire and into good position within the right collecting system, confirming placement via fluoroscopy.  The rigid cystoscope was then used to drain the patient's bladder and remove the stone fragments within it.  She tolerated the procedure well and was transferred to the postanesthesia in stable condition.   Plan: Follow-up in 1 week for office cystoscopy and stent removal.  Follow-up in 6 weeks for right renal ultrasound

## 2018-05-19 NOTE — Anesthesia Procedure Notes (Signed)
Procedure Name: LMA Insertion Date/Time: 05/19/2018 10:25 AM Performed by: Wanita Chamberlain, CRNA Pre-anesthesia Checklist: Patient identified, Timeout performed, Emergency Drugs available, Suction available and Patient being monitored Patient Re-evaluated:Patient Re-evaluated prior to induction Oxygen Delivery Method: Circle system utilized Preoxygenation: Pre-oxygenation with 100% oxygen Induction Type: IV induction Ventilation: Mask ventilation without difficulty LMA: LMA inserted LMA Size: 4.0 Number of attempts: 1 Placement Confirmation: breath sounds checked- equal and bilateral,  CO2 detector and positive ETCO2 Tube secured with: Tape Dental Injury: Teeth and Oropharynx as per pre-operative assessment

## 2018-05-24 ENCOUNTER — Encounter (HOSPITAL_BASED_OUTPATIENT_CLINIC_OR_DEPARTMENT_OTHER): Payer: Self-pay | Admitting: Urology

## 2018-07-16 ENCOUNTER — Encounter: Payer: Self-pay | Admitting: Gastroenterology

## 2018-08-29 NOTE — Progress Notes (Signed)
Referring Provider: Everardo Beals, NP Primary Care Physician:  Everardo Beals, NP  Reason for Consultation:  Rectal bleeding   IMPRESSION:  Rectal bleeding Normocytic anemia with hgb 10.8  History of colon polyps    - last exam >10 years    - "benign" per patient report, neither procedure note nor path are available Recent ureteral stones requiring stenting  PLAN: CBC, iron, ferritin, B12, folate Colonoscopy recommended Will plan rectal exam while sedated  I consented the patient at the bedside today discussing the risks, benefits, and alternatives to endoscopic evaluation. In particular, we discussed the risks that include, but are not limited to, reaction to medication, cardiopulmonary compromise, bleeding requiring blood transfusion, aspiration resulting in pneumonia, perforation requiring surgery, and even death. We reviewed the risk of missed lesion including polyps or even cancer. The patient acknowledges these risks and asks that we proceed.   HPI: [name] is a 72 y.o. female At the request of nurse practitioner [place] for further evaluation of rectal bleeding. Her daughter accompanies her to this appointment.  [name] has a history of type 2 diabetes mellitus hypertension and hyperlipidemia.  History is obtained through the patient and, review of her electronic health record, and referral documents. Recent pneumonia and ureteral stone requiring stenting 05/04/18 due to urosepsis from right ureteral obstruction. She is requiring ongoing ongoing antibiotics. She was seen by her urologist and found to have rectal bleeding.  She saw a little bood on the toilet paper. No rectal pain or tearing. No diarrhea or constipation. No change in bowel habits. No melena. She will use applesauce if she even needs a laxative.  Labs from May 14, 2018 show a normal CMP except for an albumin of 2.9 a BUN of 4 and a glucose 215 and alkaline phosphatase 218.  Her hemoglobin was  10.8 with an MCV of 88 and an RDW of 14.1   There is no record of prior endoscopy in her electronic health record. She reports a colonoscopy in [place] over 10 years. She had polyps removed at that time.  Her daughter accompanies her but cannot remember it being in the last 10 years. She had no difficulty with the colonoscopy at that time. However, she does not want to have one now.    Past Medical History:  Diagnosis Date  . Arthritis   . Bilateral lower extremity edema   . Flank pain   . Full dentures   . History of cervical cancer    11/ 1994  Stage IB  s/p  high dose radiation brachytherapy @ [facility] 01/ 1995--- per pt no recurrence  . History of chronic bronchitis   . History of hyperthyroidism    d 03/ 2011---due to grave's disease--- s/p  RAI treatment 04/ 2011  . History of sepsis 05/03/2018   due to UTI with klebsiella/ pyelonephritis/ ureteral obstruction cause by stone  . Hyperlipidemia   . Hypothyroidism, postradioiodine therapy    endocrinologist--  dr Loanne Drilling--  dx graves disease and s/p RAI i131 treatement 4/ 2011  . Nauseated   . Oral thrush 05/03/2018  . Type 2 diabetes mellitus treated with insulin ([place])    FOLLOWED BY PCP  . Urgency of urination   . Wears glasses     Past Surgical History:  Procedure Laterality Date  . APPENDECTOMY    . CATARACT EXTRACTION W/ INTRAOCULAR LENS  IMPLANT, BILATERAL  2004  approx.  . CYSTOSCOPY WITH STENT PLACEMENT Right 05/04/2018   Procedure: CYSTOSCOPY WITH  STENT PLACEMENT AND RETROGRADE PYELOGRAM;  Surgeon: Ceasar Mons, MD;  Location: [facility];  Service: Urology;  Laterality: Right;  . CYSTOSCOPY/URETEROSCOPY/HOLMIUM LASER/STENT PLACEMENT Right 05/19/2018   Procedure: CYSTOSCOPY, URETEROSCOPY/HOLMIUM LASER, STONE BASKETRY/ STENT EXCHANGE;  Surgeon: Ceasar Mons, MD;  Location: [facility];  Service: Urology;  Laterality: Right;  . EXCISION MASS LEFT CHEST WALL  09-20-2007   dr Grandville Silos   [facility]  . REVISION TOTAL HIP ARTHROPLASTY Left early 2000s  . TANDEM RING INSERTION  1995   dr Aldean Ast @ [facility]   EUA w/ tandem placement in ovid  (for direct high dose radiation brachytherapy , cervical cancer)  . TOTAL HIP ARTHROPLASTY Left 1990s  . TRANSTHORACIC ECHOCARDIOGRAM  08/07/2017   mild focal basal hypertrophy of the septum,  ef 51-02%, grade 1 diastolic dysfunction/  atrial septum with lipomatous hypertrophy/  trivial PR    Prior to Admission medications   Medication Sig Start Date End Date Taking? Authorizing Provider  acetaminophen (TYLENOL) 325 MG tablet Take 2 tablets (650 mg total) by mouth every 6 (six) hours as needed for mild pain (or Fever >/= 101). 05/07/18   Georgette Shell, MD  ALPRAZolam Duanne Moron) 0.5 MG tablet Take 1 tablet (0.5 mg total) by mouth 2 (two) times daily as needed for anxiety. 05/07/18   Georgette Shell, MD  cephALEXin (KEFLEX) 500 MG capsule Take 1 capsule (500 mg total) by mouth every 8 (eight) hours. 05/07/18   Georgette Shell, MD  ergocalciferol (VITAMIN D2) 50000 UNITS capsule Take 50,000 Units by mouth once a week. Sundays    [provider]  gabapentin (NEURONTIN) 100 MG capsule Take 300 mg by mouth 2 (two) times daily.     [provider]  gemfibrozil (LOPID) 600 MG tablet Take 600 mg by mouth 2 (two) times daily.      [provider]  HYDROcodone-acetaminophen (NORCO) 5-325 MG tablet Take 1 tablet by mouth every 4 (four) hours as needed for moderate pain. 05/19/18   Ceasar Mons, MD  insulin aspart (NOVOLOG) 100 UNIT/ML injection Inject 4 Units into the skin 3 (three) times daily with meals. Patient taking differently: Inject 4 Units into the skin as directed. TID WITH MEALS PER SLIDING SCALE 05/07/18   Georgette Shell, MD  LEVEMIR FLEXTOUCH 100 UNIT/ML Pen Inject 36 Units into the skin at bedtime.  04/21/18   [provider]  levothyroxine (SYNTHROID, LEVOTHROID) 25 MCG tablet Take 25  mcg by mouth daily before breakfast.    [provider]  methocarbamol (ROBAXIN) 500 MG tablet TAKE ONE TABLET BY MOUTH EVERY 6 HOURS AS NEEDED FOR CRAMPS 05/10/11   Renato Shin, MD  nystatin (MYCOSTATIN) 100000 UNIT/ML suspension Take 5 mLs (500,000 Units total) by mouth 4 (four) times daily. 05/07/18   Georgette Shell, MD  ondansetron [facility]) 4 MG tablet Take 1 tablet (4 mg total) by mouth daily as needed for nausea or vomiting. 05/04/18 05/04/19  Ceasar Mons, MD  ondansetron (ZOFRAN) 4 MG tablet Take 1 tablet (4 mg total) by mouth daily as needed for nausea or vomiting. 05/19/18 05/19/19  Ceasar Mons, MD  opium-belladonna (B&O SUPPRETTES) 16.2-60 MG suppository Place 1 suppository rectally every 6 (six) hours as needed for bladder spasms (use if not relieved with ditropan). 05/07/18   Georgette Shell, MD  oxybutynin (DITROPAN) 5 MG tablet Take 1 tablet (5 mg total) by mouth every 8 (eight) hours as needed for bladder spasms (PRN bladder spasms).  05/07/18   Georgette Shell, MD  phenazopyridine (PYRIDIUM) 200 MG tablet Take 1 tablet (200 mg total) by mouth 3 (three) times daily as needed (for pain with urination). 05/04/18 05/04/19  Ceasar Mons, MD    Current Outpatient Medications  Medication Sig Dispense Refill  . acetaminophen (TYLENOL) 325 MG tablet Take 2 tablets (650 mg total) by mouth every 6 (six) hours as needed for mild pain (or Fever >/= 101).    Marland Kitchen ALPRAZolam (XANAX) 0.5 MG tablet Take 1 tablet (0.5 mg total) by mouth 2 (two) times daily as needed for anxiety. 30 tablet 0  . cephALEXin (KEFLEX) 500 MG capsule Take 1 capsule (500 mg total) by mouth every 8 (eight) hours. 30 capsule 0  . ergocalciferol (VITAMIN D2) 50000 UNITS capsule Take 50,000 Units by mouth once a week. Sundays    . gabapentin (NEURONTIN) 100 MG capsule Take 300 mg by mouth 2 (two) times daily.     Marland Kitchen gemfibrozil (LOPID) 600 MG tablet Take 600 mg by mouth 2 (two)  times daily.      Marland Kitchen HYDROcodone-acetaminophen (NORCO) 5-325 MG tablet Take 1 tablet by mouth every 4 (four) hours as needed for moderate pain. 20 tablet 0  . insulin aspart (NOVOLOG) 100 UNIT/ML injection Inject 4 Units into the skin 3 (three) times daily with meals. (Patient taking differently: Inject 4 Units into the skin as directed. TID WITH MEALS PER SLIDING SCALE) 10 mL 11  . LEVEMIR FLEXTOUCH 100 UNIT/ML Pen Inject 36 Units into the skin at bedtime.   2  . levothyroxine (SYNTHROID, LEVOTHROID) 25 MCG tablet Take 25 mcg by mouth daily before breakfast.    . methocarbamol (ROBAXIN) 500 MG tablet TAKE ONE TABLET BY MOUTH EVERY 6 HOURS AS NEEDED FOR CRAMPS 30 tablet 5  . nystatin (MYCOSTATIN) 100000 UNIT/ML suspension Take 5 mLs (500,000 Units total) by mouth 4 (four) times daily. 60 mL 0  . ondansetron (ZOFRAN) 4 MG tablet Take 1 tablet (4 mg total) by mouth daily as needed for nausea or vomiting. 30 tablet 1  . ondansetron (ZOFRAN) 4 MG tablet Take 1 tablet (4 mg total) by mouth daily as needed for nausea or vomiting. 30 tablet 1  . opium-belladonna (B&O SUPPRETTES) 16.2-60 MG suppository Place 1 suppository rectally every 6 (six) hours as needed for bladder spasms (use if not relieved with ditropan). 12 suppository 0  . oxybutynin (DITROPAN) 5 MG tablet Take 1 tablet (5 mg total) by mouth every 8 (eight) hours as needed for bladder spasms (PRN bladder spasms). 30 tablet 0  . phenazopyridine (PYRIDIUM) 200 MG tablet Take 1 tablet (200 mg total) by mouth 3 (three) times daily as needed (for pain with urination). 30 tablet 0   No current facility-administered medications for this visit.     Allergies as of 08/31/2018 - Review Complete 05/19/2018  Allergen Reaction Noted  . Codeine Nausea Only 10/19/2008    Family History  Problem Relation Age of Onset  . Emphysema Father   . Emphysema Sister   . Emphysema Brother   . Cancer Mother        Skin Cancer  . Thyroid disease Mother         uncertain type     Social History   Socioeconomic History  . Marital status: Married    Spouse name: Not on file  . Number of children: Not on file  . Years of education: Not on file  . Highest education level: Not on file  Occupational History  . Occupation: Disabled  Social Needs  . Financial resource strain: Not on file  . Food insecurity:    Worry: Not on file    Inability: Not on file  . Transportation needs:    Medical: Not on file    Non-medical: Not on file  Tobacco Use  . Smoking status: Former Smoker    Years: 5.00    Types: Cigarettes    Last attempt to quit: 05/13/1982    Years since quitting: 36.3  . Smokeless tobacco: Never Used  Substance and Sexual Activity  . Alcohol use: No  . Drug use: No  . Sexual activity: Not on file  Lifestyle  . Physical activity:    Days per week: Not on file    Minutes per session: Not on file  . Stress: Not on file  Relationships  . Social connections:    Talks on phone: Not on file    Gets together: Not on file    Attends religious service: Not on file    Active member of club or organization: Not on file    Attends meetings of clubs or organizations: Not on file    Relationship status: Not on file  . Intimate partner violence:    Fear of current or ex partner: Not on file    Emotionally abused: Not on file    Physically abused: Not on file    Forced sexual activity: Not on file  Other Topics Concern  . Not on file  Social History Narrative   Married and lives with husband   Has children   Housewife   disabled    Review of Systems: 12 system ROS is negative except as noted above.   Physical Exam: Vital signs in last 24 hours: @VSRANGES @   General:   Alert, pleasant and cooperative in NAD. Frail. Using cane for walking assistance. Chronically ill appearing.  Head:  Normocephalic and atraumatic. Eyes:  Sclera clear, no icterus.   Conjunctiva pink. Mouth:  No deformity or lesions.   Neck:  Supple; no masses  or thyromegaly. Lungs:  Clear throughout to auscultation.   No wheezes, crackles, or rhonchi.  Heart:  Regular rate and rhythm; no murmurs, clicks, rubs,  or gallops. Abdomen:  Soft,nontender without rebound or guarding, normal bowel sounds. Rectal:  Patient declined rectal exam - difficult to position Msk:  Symmetrical. Limited range of motion.  Pulses:  Normal pulses noted. Extremities:  Without clubbing or edema. Neurologic:  Alert and  oriented x4;  grossly normal neurologically. Skin:  Intact without significant lesions or rashes.. Psych:  Alert and cooperative. Normal mood and affect. Poor insight.   Thornton Park, MD, MPH [facility]

## 2018-08-31 ENCOUNTER — Ambulatory Visit: Payer: Medicare HMO | Admitting: Gastroenterology

## 2018-08-31 ENCOUNTER — Encounter: Payer: Self-pay | Admitting: Gastroenterology

## 2018-08-31 ENCOUNTER — Other Ambulatory Visit (INDEPENDENT_AMBULATORY_CARE_PROVIDER_SITE_OTHER): Payer: Medicare HMO

## 2018-08-31 ENCOUNTER — Other Ambulatory Visit: Payer: Self-pay

## 2018-08-31 ENCOUNTER — Encounter (INDEPENDENT_AMBULATORY_CARE_PROVIDER_SITE_OTHER): Payer: Self-pay

## 2018-08-31 VITALS — BP 130/70 | HR 80 | Ht 59.5 in | Wt 158.1 lb

## 2018-08-31 DIAGNOSIS — D649 Anemia, unspecified: Secondary | ICD-10-CM | POA: Diagnosis not present

## 2018-08-31 DIAGNOSIS — K625 Hemorrhage of anus and rectum: Secondary | ICD-10-CM | POA: Diagnosis not present

## 2018-08-31 DIAGNOSIS — Z86010 Personal history of colonic polyps: Secondary | ICD-10-CM | POA: Diagnosis not present

## 2018-08-31 DIAGNOSIS — E538 Deficiency of other specified B group vitamins: Secondary | ICD-10-CM

## 2018-08-31 LAB — CBC WITH DIFFERENTIAL/PLATELET
Basophils Absolute: 0 10*3/uL (ref 0.0–0.1)
Basophils Relative: 0.4 % (ref 0.0–3.0)
Eosinophils Absolute: 0.1 10*3/uL (ref 0.0–0.7)
Eosinophils Relative: 1.1 % (ref 0.0–5.0)
HCT: 35.9 % — ABNORMAL LOW (ref 36.0–46.0)
Hemoglobin: 11.8 g/dL — ABNORMAL LOW (ref 12.0–15.0)
Lymphocytes Relative: 13.8 % (ref 12.0–46.0)
Lymphs Abs: 1.3 10*3/uL (ref 0.7–4.0)
MCHC: 32.9 g/dL (ref 30.0–36.0)
MCV: 85.8 fl (ref 78.0–100.0)
Monocytes Absolute: 0.6 10*3/uL (ref 0.1–1.0)
Monocytes Relative: 6.6 % (ref 3.0–12.0)
Neutro Abs: 7.3 10*3/uL (ref 1.4–7.7)
Neutrophils Relative %: 78.1 % — ABNORMAL HIGH (ref 43.0–77.0)
Platelets: 397 10*3/uL (ref 150.0–400.0)
RBC: 4.18 Mil/uL (ref 3.87–5.11)
RDW: 14.1 % (ref 11.5–15.5)
WBC: 9.3 10*3/uL (ref 4.0–10.5)

## 2018-08-31 LAB — FERRITIN: Ferritin: 88.7 ng/mL (ref 10.0–291.0)

## 2018-08-31 LAB — IRON: Iron: 33 ug/dL — ABNORMAL LOW (ref 42–145)

## 2018-08-31 LAB — VITAMIN B12: Vitamin B-12: 218 pg/mL (ref 211–911)

## 2018-08-31 LAB — FOLATE: Folate: 8.9 ng/mL (ref >5.9)

## 2018-08-31 NOTE — Patient Instructions (Addendum)
You have been scheduled for a colonoscopy. Please follow written instructions given to you at your visit today.  Please pick up your prep supplies at the pharmacy within the next 1-3 days. If you use inhalers (even only as needed), please bring them with you on the day of your procedure. Your physician has requested that you go to www.startemmi.com and enter the access code given to you at your visit today. This web site gives a general overview about your procedure. However, you should still follow specific instructions given to you by our office regarding your preparation for the procedure.   Your provider has requested that you go to the basement level for lab work before leaving today. Press "B" on the elevator. The lab is located at the first door on the left as you exit the elevator.

## 2018-09-02 ENCOUNTER — Other Ambulatory Visit: Payer: Self-pay | Admitting: *Deleted

## 2018-09-02 DIAGNOSIS — Z1231 Encounter for screening mammogram for malignant neoplasm of breast: Secondary | ICD-10-CM

## 2018-09-03 ENCOUNTER — Encounter: Payer: Self-pay | Admitting: Gastroenterology

## 2018-09-03 ENCOUNTER — Ambulatory Visit (AMBULATORY_SURGERY_CENTER): Payer: Medicare HMO | Admitting: Gastroenterology

## 2018-09-03 ENCOUNTER — Other Ambulatory Visit: Payer: Medicare HMO

## 2018-09-03 VITALS — BP 98/58 | HR 79 | Temp 98.0°F | Resp 18 | Ht 59.0 in | Wt 158.0 lb

## 2018-09-03 DIAGNOSIS — D649 Anemia, unspecified: Secondary | ICD-10-CM

## 2018-09-03 DIAGNOSIS — Z538 Procedure and treatment not carried out for other reasons: Secondary | ICD-10-CM

## 2018-09-03 DIAGNOSIS — E538 Deficiency of other specified B group vitamins: Secondary | ICD-10-CM

## 2018-09-03 DIAGNOSIS — K625 Hemorrhage of anus and rectum: Secondary | ICD-10-CM

## 2018-09-03 DIAGNOSIS — Z86010 Personal history of colonic polyps: Secondary | ICD-10-CM

## 2018-09-03 MED ORDER — SODIUM CHLORIDE 0.9 % IV SOLN: 500.0000 mL | Freq: Once | INTRAVENOUS | Status: DC

## 2018-09-03 NOTE — Patient Instructions (Signed)
   Call and schedule repeat colonoscopy if get home and change your mind about having colonoscopy done as ordered by Dr Tarri Glenn.    YOU HAD AN ENDOSCOPIC PROCEDURE TODAY AT [facility]:   Refer to the procedure report that was given to you for any specific questions about what was found during the examination.  If the procedure report does not answer your questions, please call your gastroenterologist to clarify.  If you requested that your care partner not be given the details of your procedure findings, then the procedure report has been included in a sealed envelope for you to review at your convenience later.  YOU SHOULD EXPECT: Some feelings of bloating in the abdomen. Passage of more gas than usual.  Walking can help get rid of the air that was put into your GI tract during the procedure and reduce the bloating. If you had a lower endoscopy (such as a colonoscopy or flexible sigmoidoscopy) you may notice spotting of blood in your stool or on the toilet paper. If you underwent a bowel prep for your procedure, you may not have a normal bowel movement for a few days.  Please Note:  You might notice some irritation and congestion in your nose or some drainage.  This is from the oxygen used during your procedure.  There is no need for concern and it should clear up in a day or so.  SYMPTOMS TO REPORT IMMEDIATELY:   Following lower endoscopy (colonoscopy or flexible sigmoidoscopy):  Excessive amounts of blood in the stool  Significant tenderness or worsening of abdominal pains  Swelling of the abdomen that is new, acute  Fever of 100F or higher   For urgent or emergent issues, a gastroenterologist can be reached at any hour by calling [phone].   DIET:  We do recommend a small meal at first, but then you may proceed to your regular diet.  Drink plenty of fluids but you should avoid alcoholic beverages for 24 hours.  ACTIVITY:  You should plan to take it easy for the  rest of today and you should NOT DRIVE or use heavy machinery until tomorrow (because of the sedation medicines used during the test).    FOLLOW UP: Our staff will call the number listed on your records the next business day following your procedure to check on you and address any questions or concerns that you may have regarding the information given to you following your procedure. If we do not reach you, we will leave a message.  However, if you are feeling well and you are not experiencing any problems, there is no need to return our call.  We will assume that you have returned to your regular daily activities without incident.  If any biopsies were taken you will be contacted by phone or by letter within the next 1-3 weeks.  Please call us at [phone] if you have not heard about the biopsies in 3 weeks.    SIGNATURES/CONFIDENTIALITY: You and/or your care partner have signed paperwork which will be entered into your electronic medical record.  These signatures attest to the fact that that the information above on your After Visit Summary has been reviewed and is understood.  Full responsibility of the confidentiality of this discharge information lies with you and/or your care-partner.

## 2018-09-03 NOTE — Progress Notes (Signed)
Pt. Refused to have another colonoscopy,instructed pt. If she change her mind to call back and we would be glad to schedule procedure. Dr. Arville Go aware of pt. Decision.

## 2018-09-03 NOTE — Progress Notes (Signed)
Report given to PACU, vss 

## 2018-09-03 NOTE — Op Note (Signed)
[facility] Patient Name: [name] Procedure Date: 09/03/2018 8:56 AM MRN: [id number] Endoscopist: Thornton Park MD, MD Age: 72 Referring MD:  Date of Birth: 08/27/1946 Gender: Female Account #: [id number] Procedure:                Colonoscopy Indications:              Rectal bleeding. Unexplained anemia. Medicines:                See the Anesthesia note for documentation of the                            administered medications Procedure:                Pre-Anesthesia Assessment:                           - Prior to the procedure, a History and Physical                            was performed, and patient medications and                            allergies were reviewed. The patient's tolerance of                            previous anesthesia was also reviewed. The risks                            and benefits of the procedure and the sedation                            options and risks were discussed with the patient.                            All questions were answered, and informed consent                            was obtained. Prior Anticoagulants: The patient has                            taken no previous anticoagulant or antiplatelet                            agents. ASA Grade Assessment: III - A patient with                            severe systemic disease. After reviewing the risks                            and benefits, the patient was deemed in                            satisfactory condition to undergo the procedure.  After obtaining informed consent, the colonoscope                            was passed under direct vision. Throughout the                            procedure, the patient's blood pressure, pulse, and                            oxygen saturations were monitored continuously. The                            Colonoscope was introduced through the anus with                            the intention of  advancing to the cecum. The scope                            was advanced to the sigmoid colon before the                            procedure was aborted. Medications were given. The                            colonoscopy was technically difficult and complex                            due to inadequate bowel prep. The patient tolerated                            the procedure well. The quality of the bowel                            preparation was unsatisfactory. Scope In: 9:03:01 AM Scope Out: 9:04:34 AM Total Procedure Duration: 0 hours 1 minute 33 seconds  Findings:                 The perianal and digital rectal examinations were                            normal.                           Copious quantities of stool was found at the anus,                            in the rectum, in the recto-sigmoid colon and in                            the distal sigmoid colon, precluding visualization.                            The procedure was terminated due to inability to  make any meaningful assessment of the colonic                            mucosa. Complications:            No immediate complications. Estimated Blood Loss:     Estimated blood loss: none. Impression:               - Preparation of the colon was unsatisfactory.                           - Stool at the anus, in the rectum, in the                            recto-sigmoid colon and in the distal sigmoid colon.                           - No specimens collected. Recommendation:           - Resume regular diet.                           - Continue present medications.                           - Repeat colonoscopy at the next available                            appointment because the bowel preparation was poor.                            Plan Miralax 17g twice daily for the 3 days prior                            to the routine purgative prep. Thornton Park MD, MD 09/03/2018 9:11:25  AM This report has been signed electronically.

## 2018-09-03 NOTE — Progress Notes (Unsigned)
lymsub

## 2018-09-06 LAB — HOMOCYSTEINE: Homocysteine: 21 umol/L — ABNORMAL HIGH (ref <10.4)

## 2018-09-06 LAB — METHYLMALONIC ACID, SERUM: Methylmalonic Acid, Quant: 245 nmol/L (ref 87–318)

## 2018-09-06 LAB — INTRINSIC FACTOR ANTIBODIES: Intrinsic Factor: NEGATIVE

## 2018-09-08 ENCOUNTER — Other Ambulatory Visit: Payer: Self-pay

## 2018-09-08 DIAGNOSIS — E538 Deficiency of other specified B group vitamins: Secondary | ICD-10-CM

## 2018-09-09 ENCOUNTER — Telehealth: Payer: Self-pay | Admitting: Gastroenterology

## 2018-09-09 MED ORDER — FOLIC ACID 5 MG PO CAPS: 5.0000 mg | ORAL_CAPSULE | Freq: Every day | ORAL | 0 refills | Status: DC

## 2018-09-09 NOTE — Telephone Encounter (Signed)
Sent script for folic acid 5 mg, qd for 90 days

## 2018-10-08 ENCOUNTER — Ambulatory Visit: Payer: Medicare HMO

## 2019-02-04 ENCOUNTER — Encounter: Payer: Self-pay | Admitting: Podiatry

## 2019-02-04 ENCOUNTER — Ambulatory Visit: Payer: Medicare HMO | Admitting: Podiatry

## 2019-02-04 ENCOUNTER — Other Ambulatory Visit: Payer: Self-pay

## 2019-02-04 DIAGNOSIS — M2041 Other hammer toe(s) (acquired), right foot: Secondary | ICD-10-CM | POA: Diagnosis not present

## 2019-02-04 DIAGNOSIS — L84 Corns and callosities: Secondary | ICD-10-CM

## 2019-02-04 DIAGNOSIS — M2042 Other hammer toe(s) (acquired), left foot: Secondary | ICD-10-CM

## 2019-02-04 DIAGNOSIS — M79674 Pain in right toe(s): Secondary | ICD-10-CM

## 2019-02-04 DIAGNOSIS — E1142 Type 2 diabetes mellitus with diabetic polyneuropathy: Secondary | ICD-10-CM | POA: Diagnosis not present

## 2019-02-04 DIAGNOSIS — B351 Tinea unguium: Secondary | ICD-10-CM | POA: Diagnosis not present

## 2019-02-04 DIAGNOSIS — M79675 Pain in left toe(s): Secondary | ICD-10-CM | POA: Diagnosis not present

## 2019-02-04 NOTE — Patient Instructions (Signed)
Diabetes Mellitus and Foot Care  Foot care is an important part of your health, especially when you have diabetes. Diabetes may cause you to have problems because of poor blood flow (circulation) to your feet and legs, which can cause your skin to:   Become thinner and drier.   Break more easily.   Heal more slowly.   Peel and crack.  You may also have nerve damage (neuropathy) in your legs and feet, causing decreased feeling in them. This means that you may not notice minor injuries to your feet that could lead to more serious problems. Noticing and addressing any potential problems early is the best way to prevent future foot problems.  How to care for your feet  Foot hygiene   Wash your feet daily with warm water and mild soap. Do not use hot water. Then, pat your feet and the areas between your toes until they are completely dry. Do not soak your feet as this can dry your skin.   Trim your toenails straight across. Do not dig under them or around the cuticle. File the edges of your nails with an emery board or nail file.   Apply a moisturizing lotion or petroleum jelly to the skin on your feet and to dry, brittle toenails. Use lotion that does not contain alcohol and is unscented. Do not apply lotion between your toes.  Shoes and socks   Wear clean socks or stockings every day. Make sure they are not too tight. Do not wear knee-high stockings since they may decrease blood flow to your legs.   Wear shoes that fit properly and have enough cushioning. Always look in your shoes before you put them on to be sure there are no objects inside.   To break in new shoes, wear them for just a few hours a day. This prevents injuries on your feet.  Wounds, scrapes, corns, and calluses   Check your feet daily for blisters, cuts, bruises, sores, and redness. If you cannot see the bottom of your feet, use a mirror or ask someone for help.   Do not cut corns or calluses or try to remove them with medicine.   If you  find a minor scrape, cut, or break in the skin on your feet, keep it and the skin around it clean and dry. You may clean these areas with mild soap and water. Do not clean the area with peroxide, alcohol, or iodine.   If you have a wound, scrape, corn, or callus on your foot, look at it several times a day to make sure it is healing and not infected. Check for:  ? Redness, swelling, or pain.  ? Fluid or blood.  ? Warmth.  ? Pus or a bad smell.  General instructions   Do not cross your legs. This may decrease blood flow to your feet.   Do not use heating pads or hot water bottles on your feet. They may burn your skin. If you have lost feeling in your feet or legs, you may not know this is happening until it is too late.   Protect your feet from hot and cold by wearing shoes, such as at the beach or on hot pavement.   Schedule a complete foot exam at least once a year (annually) or more often if you have foot problems. If you have foot problems, report any cuts, sores, or bruises to your health care provider immediately.  Contact a health care provider if:     You have a medical condition that increases your risk of infection and you have any cuts, sores, or bruises on your feet.   You have an injury that is not healing.   You have redness on your legs or feet.   You feel burning or tingling in your legs or feet.   You have pain or cramps in your legs and feet.   Your legs or feet are numb.   Your feet always feel cold.   You have pain around a toenail.  Get help right away if:   You have a wound, scrape, corn, or callus on your foot and:  ? You have pain, swelling, or redness that gets worse.  ? You have fluid or blood coming from the wound, scrape, corn, or callus.  ? Your wound, scrape, corn, or callus feels warm to the touch.  ? You have pus or a bad smell coming from the wound, scrape, corn, or callus.  ? You have a fever.  ? You have a red line going up your leg.  Summary   Check your feet every day  for cuts, sores, red spots, swelling, and blisters.   Moisturize feet and legs daily.   Wear shoes that fit properly and have enough cushioning.   If you have foot problems, report any cuts, sores, or bruises to your health care provider immediately.   Schedule a complete foot exam at least once a year (annually) or more often if you have foot problems.  This information is not intended to replace advice given to you by your health care provider. Make sure you discuss any questions you have with your health care provider.  Document Released: 10/31/2000 Document Revised: 12/16/2017 Document Reviewed: 12/05/2016  Elsevier Interactive Patient Education  2019 [employer]

## 2019-02-10 ENCOUNTER — Encounter: Payer: Self-pay | Admitting: Podiatry

## 2019-02-10 NOTE — Progress Notes (Signed)
Subjective: [name] presents today with diabetes, diabetic neuropathy for diabetic foot examination. She c/o painful callus left great toe which has been present for years. She did visit our clinic in 2015.  Pain is getting progressively worse.   Past Medical History:  Diagnosis Date  . Arthritis   . Bilateral lower extremity edema   . Flank pain   . Full dentures   . History of cervical cancer    11/ 1994  Stage IB  s/p  high dose radiation brachytherapy @ [facility] 01/ 1995--- per pt no recurrence  . History of chronic bronchitis   . History of hyperthyroidism    d 03/ 2011---due to grave's disease--- s/p  RAI treatment 04/ 2011  . History of sepsis 05/03/2018   due to UTI with klebsiella/ pyelonephritis/ ureteral obstruction cause by stone  . Hyperlipidemia   . Hypothyroidism, postradioiodine therapy    endocrinologist--  dr Loanne Drilling--  dx graves disease and s/p RAI i131 treatement 4/ 2011  . Nauseated   . Oral thrush 05/03/2018  . Ovarian cancer ([place])   . Pneumonia   . Type 2 diabetes mellitus treated with insulin ([place])    FOLLOWED BY PCP  . Urgency of urination   . Wears glasses      Patient Active Problem List   Diagnosis Date Noted  . Sepsis ([place]) 05/04/2018  . Acute lower UTI 05/04/2018  . Uncontrolled type 2 diabetes mellitus with hyperglycemia ([place]) 05/04/2018  . ARF (acute renal failure) ([place]) 05/04/2018  . Essential hypertension 05/04/2018  . Bilateral lower extremity edema 07/31/2017  . MUSCLE CRAMPS 09/19/2010  . NUMBNESS 09/19/2010  . HYPOTHYROIDISM, POST-RADIATION 06/06/2010  . ADENOCARCINOMA, OVARY 02/12/2010  . THRUSH 11/29/2008  . DIABETES, TYPE 2 10/20/2008  . HYPERLIPIDEMIA 10/20/2008  . BRONCHITIS, ACUTE WITH BRONCHOSPASM 10/20/2008  . EMPHYSEMA 10/20/2008  . C O P D 10/20/2008  . CROHN'S DISEASE 10/20/2008  . TOBACCO ABUSE-HISTORY OF 10/20/2008     Past Surgical History:  Procedure Laterality Date  . APPENDECTOMY    . CATARACT EXTRACTION  W/ INTRAOCULAR LENS  IMPLANT, BILATERAL  2004  approx.  . CYSTOSCOPY WITH STENT PLACEMENT Right 05/04/2018   Procedure: CYSTOSCOPY WITH STENT PLACEMENT AND RETROGRADE PYELOGRAM;  Surgeon: Ceasar Mons, MD;  Location: [facility];  Service: Urology;  Laterality: Right;  . CYSTOSCOPY/URETEROSCOPY/HOLMIUM LASER/STENT PLACEMENT Right 05/19/2018   Procedure: CYSTOSCOPY, URETEROSCOPY/HOLMIUM LASER, STONE BASKETRY/ STENT EXCHANGE;  Surgeon: Ceasar Mons, MD;  Location: [facility];  Service: Urology;  Laterality: Right;  . EXCISION MASS LEFT CHEST WALL  09-20-2007   dr Grandville Silos  [facility]  . Radioactive Iodine Therapy     for thyroid  . REVISION TOTAL HIP ARTHROPLASTY Left early 2000s  . TANDEM RING INSERTION  1995   dr Aldean Ast @ [facility]   EUA w/ tandem placement in ovid  (for direct high dose radiation brachytherapy , cervical cancer)  . TOTAL HIP ARTHROPLASTY Left 1990s  . TRANSTHORACIC ECHOCARDIOGRAM  08/07/2017   mild focal basal hypertrophy of the septum,  ef 45-85%, grade 1 diastolic dysfunction/  atrial septum with lipomatous hypertrophy/  trivial PR      Current Outpatient Medications:  .  alendronate (FOSAMAX) 10 MG tablet, alendronate 10 mg tablet, Disp: , Rfl:  .  ALPRAZolam (XANAX) 0.25 MG tablet, Take 1 tablet by mouth at bedtime., Disp: , Rfl:  .  baclofen (LIORESAL) 10 MG tablet, Take 10 mg by mouth 4 (four) times daily., Disp: , Rfl:  .  ciprofloxacin (CIPRO) 500 MG tablet, Take 500 mg by mouth 2 (two) times daily., Disp: , Rfl:  .  ergocalciferol (VITAMIN D2) 50000 UNITS capsule, Take 50,000 Units by mouth once a week. Sundays, Disp: , Rfl:  .  fluconazole (DIFLUCAN) 150 MG tablet, Take 150 mg by mouth daily., Disp: , Rfl:  .  Folic Acid 5 MG CAPS, Take 1 capsule (5 mg total) by mouth daily., Disp: 90 capsule, Rfl: 0 .  gabapentin (NEURONTIN) 100 MG capsule, Take 300 mg by mouth 2 (two) times daily. , Disp: , Rfl:  .  gemfibrozil (LOPID) 600 MG  tablet, Take 600 mg by mouth 2 (two) times daily.  , Disp: , Rfl:  .  glipiZIDE (GLUCOTROL XL) 10 MG 24 hr tablet, glipizide ER 10 mg tablet, extended release 24 hr, Disp: , Rfl:  .  hydrochlorothiazide (HYDRODIURIL) 12.5 MG tablet, hydrochlorothiazide 12.5 mg tablet, Disp: , Rfl:  .  HYDROcodone-acetaminophen (NORCO/VICODIN) 5-325 MG tablet, hydrocodone 5 mg-acetaminophen 325 mg tablet, Disp: , Rfl:  .  insulin aspart (NOVOLOG) 100 UNIT/ML injection, Inject 4 Units into the skin 3 (three) times daily with meals. (Patient taking differently: Inject 4 Units into the skin as directed. TID WITH MEALS PER SLIDING SCALE), Disp: 10 mL, Rfl: 11 .  Insulin Degludec (TRESIBA FLEXTOUCH [place]), Inject 40 Units into the skin at bedtime., Disp: , Rfl:  .  Insulin Detemir (LEVEMIR FLEXTOUCH) 100 UNIT/ML Pen, Levemir FlexTouch U-100 Insulin 100 unit/mL (3 mL) subcutaneous pen, Disp: , Rfl:  .  levofloxacin (LEVAQUIN) 750 MG tablet, levofloxacin 750 mg tablet, Disp: , Rfl:  .  levothyroxine (SYNTHROID, LEVOTHROID) 25 MCG tablet, Take 25 mcg by mouth daily before breakfast., Disp: , Rfl:  .  metFORMIN (GLUCOPHAGE) 500 MG tablet, Take 500 mg by mouth 2 (two) times daily with a meal., Disp: , Rfl:  .  methocarbamol (ROBAXIN) 500 MG tablet, TAKE ONE TABLET BY MOUTH EVERY 6 HOURS AS NEEDED FOR CRAMPS, Disp: 30 tablet, Rfl: 5 .  nystatin (MYCOSTATIN) 100000 UNIT/ML suspension, nystatin 100,000 unit/mL oral suspension  TAKE 5 MLS BY MOUTH 4 TIMES DAILY, Disp: , Rfl:  .  ondansetron (ZOFRAN) 4 MG tablet, ondansetron HCl 4 mg tablet  TAKE 1 TABLET BY MOUTH DAILY AS NEEDED FOR NAUSEA OR VOMITING, Disp: , Rfl:  .  oxybutynin (DITROPAN) 5 MG tablet, Take 1 tablet (5 mg total) by mouth every 8 (eight) hours as needed for bladder spasms (PRN bladder spasms)., Disp: 30 tablet, Rfl: 0 .  oxycodone (OXY-IR) 5 MG capsule, Take 5 mg by mouth every 12 (twelve) hours., Disp: , Rfl:  .  phenazopyridine (PYRIDIUM) 200 MG tablet,  phenazopyridine 200 mg tablet  TAKE 1 TABLET BY MOUTH THREE TIMES DAILY AS NEEDED FOR PAIN WITH URINATION, Disp: , Rfl:  .  potassium chloride SA (KLOR-CON M20) 20 MEQ tablet, Klor-Con M20 mEq tablet,extended release, Disp: , Rfl:  .  sulfamethoxazole-trimethoprim (BACTRIM DS,SEPTRA DS) 800-160 MG tablet, sulfamethoxazole 800 mg-trimethoprim 160 mg tablet, Disp: , Rfl:   Current Facility-Administered Medications:  .  0.9 %  sodium chloride infusion, 500 mL, Intravenous, Once, [place], MD   Allergies  Allergen Reactions  . Codeine Nausea Only     Social History   Occupational History  . Occupation: Disabled  Tobacco Use  . Smoking status: Former Smoker    Years: 5.00    Types: Cigarettes    Last attempt to quit: 05/13/1982    Years since quitting: 36.7  . Smokeless tobacco:  Never Used  Substance and Sexual Activity  . Alcohol use: No  . Drug use: No  . Sexual activity: Not on file     Family History  Problem Relation Age of Onset  . Emphysema Father   . Diabetes Father   . Emphysema Sister   . Emphysema Brother   . Cancer Mother        Skin Cancer  . Diabetes Mother       There is no immunization history on file for this patient.    Review of systems: Positive Findings in bold print.  Constitutional:  chills, fatigue, fever, sweats, weight change Communication: Optometrist, sign Ecologist, hand writing, iPad/Android device Head: headaches, head injury Eyes: changes in vision, eye pain, glaucoma, cataracts, macular degeneration, diplopia, glare,  light sensitivity, eyeglasses or contacts, blindness Ears nose mouth throat: hearing impaired, hearing aids,  ringing in ears, deaf, sign language,  vertigo,   nosebleeds,  rhinitis,  cold sores, snoring, swollen glands Cardiovascular: HTN, edema, arrhythmia, pacemaker in place, defibrillator in place, chest pain/tightness, chronic anticoagulation, blood clot, heart failure, MI Peripheral Vascular: leg  cramps, varicose veins, blood clots, lymphedema, varicosities Respiratory:  difficulty breathing, denies congestion, SOB, wheezing, cough, emphysema Gastrointestinal: change in appetite or weight, abdominal pain, constipation, diarrhea, nausea, vomiting, vomiting blood, change in bowel habits, abdominal pain, jaundice, rectal bleeding, hemorrhoids, GERD Genitourinary:  nocturia,  pain on urination, polyuria,  blood in urine, Foley catheter, urinary urgency, ESRD on hemodialysis Musculoskeletal: amputation, cramping, stiff joints, painful joints, decreased joint motion, fractures, OA, gout, hemiplegia, paraplegia, uses cane, wheelchair bound, uses walker, uses rollator Skin: +changes in toenails, color change, dryness, itching, mole changes,  rash, wound(s) Neurological: headaches, numbness in feet, paresthesias in feet, burning in feet, fainting,  seizures, change in speech. denies headaches, memory problems/poor historian, cerebral palsy, weakness, paralysis, CVA, TIA Endocrine: diabetes, hypothyroidism, hyperthyroidism,  goiter, dry mouth, flushing, heat intolerance,  cold intolerance,  excessive thirst, denies polyuria,  nocturia Hematological:  easy bleeding, excessive bleeding, easy bruising, enlarged lymph nodes, on long term blood thinner, history of past transusions Allergy/immunological:  hives, eczema, frequent infections, multiple drug allergies, seasonal allergies, transplant recipient, multiple food allergies Psychiatric:  anxiety, depression, mood disorder, suicidal ideations, hallucinations, insomnia  Objective: There were no vitals filed for this visit. Vascular Examination: Capillary refill time <3 seconds x 10 digits  Dorsalis pedis and posterior tibial pulses present b/l  No digital hair x 10 digits  Skin temperature gradient WNL b/l.  Dermatological Examination: Skin with normal turgor, texture and tone  Toenails 1-5 b/l are painful, elongated, discolored, dystrophic  with subungual debris. Pain with dorsal palpation of nailplates. No erythema, no edema, no drainage noted.   Hyperkeratotic lesion plantar aspect left IPJ 2.0 x 2.0 cm. No erythema, no edema, no drainage, no flocculence noted.  Musculoskeletal: Muscle strength 5/5 to all LE muscle groups  Hammertoe 2nd digit b/l.  Neurological: Sensation diminished with 10 gram monofilament.  Vibratory sensation diminished b/l.  Assessment: Painful onychomycosis 1-5 b/l Preulcerative callus left hallux NIDDM with neuropathy  Plan: 1. Discussed diabetic foot care principles. Literature dispensed on today. 2. Toenails 1-5 b/l were debrided in length and girth without iatrogenic laceration. Calluses pared plantar aspect left hallux utilizing sterile scalpel blade without incident. Dispensed Silicone toe cap for daily use. Call if any problems arise before next appointment. 3. Patient to continue soft, supportive shoe gear daily. 4. Patient to report any pedal injuries to medical professional immediately. 5. Follow up 3  months.  6. Patient/POA to call should there be a concern in the interim.

## 2019-02-16 DIAGNOSIS — M25552 Pain in left hip: Secondary | ICD-10-CM | POA: Insufficient documentation

## 2019-03-10 ENCOUNTER — Telehealth: Payer: Self-pay | Admitting: *Deleted

## 2019-03-10 NOTE — Telephone Encounter (Signed)
Called, spoke with the patient and her daughter regarding appt on May 1st. Gave information and instructions

## 2019-03-11 NOTE — Telephone Encounter (Signed)
Spoke with [facility] triage nurse with [facility]. Told her that [name] is set up for an appointment with Dr. Everitt Amber on 03-18-19 for pelvic mass.   [name] will inform Dr. Lovena Neighbours of appointment.

## 2019-03-18 ENCOUNTER — Other Ambulatory Visit: Payer: Self-pay

## 2019-03-18 ENCOUNTER — Inpatient Hospital Stay: Payer: Medicare HMO | Attending: Gynecologic Oncology

## 2019-03-18 ENCOUNTER — Telehealth: Payer: Self-pay | Admitting: *Deleted

## 2019-03-18 ENCOUNTER — Inpatient Hospital Stay: Payer: Medicare HMO | Admitting: Gynecologic Oncology

## 2019-03-18 ENCOUNTER — Encounter: Payer: Self-pay | Admitting: Gynecologic Oncology

## 2019-03-18 VITALS — BP 174/68 | HR 66 | Temp 98.1°F | Resp 18 | Ht 60.0 in | Wt 173.0 lb

## 2019-03-18 DIAGNOSIS — Z923 Personal history of irradiation: Secondary | ICD-10-CM | POA: Insufficient documentation

## 2019-03-18 DIAGNOSIS — R1900 Intra-abdominal and pelvic swelling, mass and lump, unspecified site: Secondary | ICD-10-CM | POA: Diagnosis not present

## 2019-03-18 DIAGNOSIS — Z9221 Personal history of antineoplastic chemotherapy: Secondary | ICD-10-CM | POA: Insufficient documentation

## 2019-03-18 DIAGNOSIS — Z8541 Personal history of malignant neoplasm of cervix uteri: Secondary | ICD-10-CM

## 2019-03-18 NOTE — Patient Instructions (Signed)
Dr Denman George has reviewed the scans and your medical record. You had a history of cervical cancer treated with radiation (not surgery) and therefore still have a uterus.  Dr. Denman George believes that the mass that is seen on CT scans is your uterus.  It is likely the radiologist misunderstood whether or not you had had the uterus removed in the past.  To confirm this Dr. Denman George is checking an ovarian cancer tumor marker today, and ordering an ultrasound to better evaluate the pelvic organs.  She will repeat the ultrasound scan in 3 months time to monitor the stability of the size of the mass.  If it remains stable in size we can be reassured that there is no tumor that is growing.  Her office can be reached at [phone] with questions.

## 2019-03-18 NOTE — Progress Notes (Signed)
Consult Note: Gyn-Onc  Consult was requested by Dr. Lovena Neighbours for the evaluation of [name] 73 y.o. female  CC:  Chief Complaint  Patient presents with  . Pelvic mass    Assessment/Plan:  [name]  is a 73 y.o.  year old with a pelvic mass seen on CT imaging for ureteral stones. She has a history of stage IB cervical cancer treated with primary chemoradiation at [facility] in 1994.  I reviewed the CT images from 2019 and April, 2020.  I believe that the pelvic mass that is 6 cm that is identified on scans is actually the patient's uterus.  It is likely that the radiologist reviewing the scan was confused regarding her history as her history listed in the scan is that she has a history of ovarian cancer.  This is not in fact the case.  I am recommending Ca1 25 ovarian cancer tumor marker today and pelvic ultrasound scan to better evaluate the pelvic viscera.  We will reevaluate with a repeat ultrasound scan in 3 months time.  If this shows stable appearance in the mass, we can be reassured that it is her normal uterus status post radiation.  No intervention is necessary.  She was provided with my contact information to reach out to me if she has any questions or concerns in this intervening 11-month  While we monitor the pelvic mass.   HPI: [name] a 73year old P2 who is seen in consultation at the request of Dr. WLovena Neighboursfor a pelvic mass.  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  [facility] under the care of Dr. GCathrine Muster  We reviewed the pathology notes from her biopsy and discharge summary from her admission for intracavitary low dose rate brachii therapy with cesium.  Following treatment from that she had complete clinical response and has remained NED since that time.  From what she reports to me it sounds like she had uterine fibroids  identified at the time of treatment of her cervical cancer.  She has had no issues with bleeding since that time.  Paps and biopsies of the vagina since that time of only showing radiation changes but no dysplasia or malignancy.  In June 2019 she developed urosepsis with a right ureteral stone.  She was treated with cystoscopic retrieval of the stone and stent placement for a period of time.  CT imaging was performed at that time.  It was repeated in April 2020 for surveillance where the stone in the ureter had moved to the bladder and did not require any intervention.  On that repeat CT scan the radiologist's note that she has a history of ovarian cancer in the clinical data and commented upon a 6.7 x 6 cm low density within the central right hemipelvis.  They state that this is increased in size from 5.3 x 4.4 cm on the prior CT scan which was the CT performed in June 2019 at the time of her urosepsis.  They do not comment on the presence of a uterus, presumably because they assumed it had been removed surgically with her diagnosis of ovarian cancer.  The patient denies vaginal bleeding, abdominal bloating, change in bladder or bowel habit.  She does have urinary and fecal incontinence secondary to her radiation.  She has no family history concerning for breast or ovarian cancer.  She has a history of  a colonoscopy with benign polyp resection.  Her surgical history is significant for an appendectomy, tubal ligation via laparotomy, hip arthroplasty, and cystoscopy with stent placement in 2019.  Current Meds:  Outpatient Encounter Medications as of 03/18/2019  Medication Sig  . alendronate (FOSAMAX) 10 MG tablet alendronate 10 mg tablet  . aspirin EC 81 MG tablet Take 81 mg by mouth daily.  . ergocalciferol (VITAMIN D2) 50000 UNITS capsule Take 50,000 Units by mouth once a week. Sundays  . gabapentin (NEURONTIN) 100 MG capsule Take 300 mg by mouth 2 (two) times daily.   Marland Kitchen gemfibrozil (LOPID) 600 MG  tablet Take 600 mg by mouth 2 (two) times daily.    Marland Kitchen glipiZIDE (GLUCOTROL XL) 10 MG 24 hr tablet glipizide ER 10 mg tablet, extended release 24 hr  . insulin degludec (TRESIBA FLEXTOUCH) 100 UNIT/ML SOPN FlexTouch Pen Tresiba FlexTouch U-100 insulin 100 unit/mL (3 mL) subcutaneous pen  INJECT 40 UNITS SUBCUTANEOUSLY ONCE DAILY AT BEDTIME FOR 30 DAYS  . levothyroxine (SYNTHROID) 125 MCG tablet   . metFORMIN (GLUCOPHAGE) 500 MG tablet Take 500 mg by mouth 2 (two) times daily with a meal.  . oxybutynin (DITROPAN-XL) 10 MG 24 hr tablet oxybutynin chloride ER 10 mg tablet,extended release 24 hr  TAKE 1 TABLET BY MOUTH ONCE DAILY  . oxycodone (OXY-IR) 5 MG capsule Take 5 mg by mouth every 12 (twelve) hours.  . ALPRAZolam (XANAX) 0.25 MG tablet Take 1 tablet by mouth at bedtime.  . baclofen (LIORESAL) 10 MG tablet Take 10 mg by mouth 4 (four) times daily.  Marland Kitchen estradiol (ESTRACE) 0.1 MG/GM vaginal cream APPLY A PEA SIZED AMOUNT AROUND THE URETHRA AND VAGINAL OPENING ONCE DAILY FOR 2 WEEKS THEN TRANSITION TO EVERY OTHER DAY  . phenazopyridine (PYRIDIUM) 200 MG tablet phenazopyridine 200 mg tablet  TAKE 1 TABLET BY MOUTH THREE TIMES DAILY AS NEEDED FOR PAIN WITH URINATION  . [DISCONTINUED] ciprofloxacin (CIPRO) 500 MG tablet Take 500 mg by mouth 2 (two) times daily.  . [DISCONTINUED] fluconazole (DIFLUCAN) 150 MG tablet Take 150 mg by mouth daily.  . [DISCONTINUED] Folic Acid 5 MG CAPS Take 1 capsule (5 mg total) by mouth daily.  . [DISCONTINUED] hydrochlorothiazide (HYDRODIURIL) 12.5 MG tablet hydrochlorothiazide 12.5 mg tablet  . [DISCONTINUED] HYDROcodone-acetaminophen (NORCO/VICODIN) 5-325 MG tablet hydrocodone 5 mg-acetaminophen 325 mg tablet  . [DISCONTINUED] insulin aspart (NOVOLOG) 100 UNIT/ML injection Inject 4 Units into the skin 3 (three) times daily with meals. (Patient taking differently: Inject 4 Units into the skin as directed. TID WITH MEALS PER SLIDING SCALE)  . [DISCONTINUED] Insulin  Degludec (TRESIBA FLEXTOUCH [place]) Inject 40 Units into the skin at bedtime.  . [DISCONTINUED] Insulin Detemir (LEVEMIR FLEXTOUCH) 100 UNIT/ML Pen Levemir FlexTouch U-100 Insulin 100 unit/mL (3 mL) subcutaneous pen  . [DISCONTINUED] levofloxacin (LEVAQUIN) 750 MG tablet levofloxacin 750 mg tablet  . [DISCONTINUED] levothyroxine (SYNTHROID, LEVOTHROID) 25 MCG tablet Take 25 mcg by mouth daily before breakfast.  . [DISCONTINUED] methocarbamol (ROBAXIN) 500 MG tablet TAKE ONE TABLET BY MOUTH EVERY 6 HOURS AS NEEDED FOR CRAMPS  . [DISCONTINUED] nystatin (MYCOSTATIN) 100000 UNIT/ML suspension nystatin 100,000 unit/mL oral suspension  TAKE 5 MLS BY MOUTH 4 TIMES DAILY  . [DISCONTINUED] ondansetron (ZOFRAN) 4 MG tablet ondansetron HCl 4 mg tablet  TAKE 1 TABLET BY MOUTH DAILY AS NEEDED FOR NAUSEA OR VOMITING  . [DISCONTINUED] oxybutynin (DITROPAN) 5 MG tablet Take 1 tablet (5 mg total) by mouth every 8 (eight) hours as needed for bladder spasms (PRN bladder spasms).  . [  DISCONTINUED] phenazopyridine (PYRIDIUM) 200 MG tablet phenazopyridine 200 mg tablet  TAKE 1 TABLET BY MOUTH THREE TIMES DAILY AS NEEDED FOR PAIN WITH URINATION  . [DISCONTINUED] potassium chloride SA (KLOR-CON M20) 20 MEQ tablet Klor-Con M20 mEq tablet,extended release  . [DISCONTINUED] sulfamethoxazole-trimethoprim (BACTRIM DS,SEPTRA DS) 800-160 MG tablet sulfamethoxazole 800 mg-trimethoprim 160 mg tablet   Facility-Administered Encounter Medications as of 03/18/2019  Medication  . 0.9 %  sodium chloride infusion    Allergy:  Allergies  Allergen Reactions  . Codeine Nausea Only    Social Hx:   Social History   Socioeconomic History  . Marital status: Married    Spouse name: Not on file  . Number of children: 2  . Years of education: Not on file  . Highest education level: Not on file  Occupational History  . Occupation: Disabled  Social Needs  . Financial resource strain: Not on file  . Food insecurity:    Worry: Not on  file    Inability: Not on file  . Transportation needs:    Medical: Not on file    Non-medical: Not on file  Tobacco Use  . Smoking status: Former Smoker    Years: 5.00    Types: Cigarettes    Last attempt to quit: 05/13/1982    Years since quitting: 36.8  . Smokeless tobacco: Never Used  Substance and Sexual Activity  . Alcohol use: No  . Drug use: No  . Sexual activity: Not Currently  Lifestyle  . Physical activity:    Days per week: Not on file    Minutes per session: Not on file  . Stress: Not on file  Relationships  . Social connections:    Talks on phone: Not on file    Gets together: Not on file    Attends religious service: Not on file    Active member of club or organization: Not on file    Attends meetings of clubs or organizations: Not on file    Relationship status: Not on file  . Intimate partner violence:    Fear of current or ex partner: Not on file    Emotionally abused: Not on file    Physically abused: Not on file    Forced sexual activity: Not on file  Other Topics Concern  . Not on file  Social History Narrative   Married and lives with husband   Has children   Housewife   disabled    Past Surgical Hx:  Past Surgical History:  Procedure Laterality Date  . APPENDECTOMY    . CATARACT EXTRACTION W/ INTRAOCULAR LENS  IMPLANT, BILATERAL  2004  approx.  . CYSTOSCOPY WITH STENT PLACEMENT Right 05/04/2018   Procedure: CYSTOSCOPY WITH STENT PLACEMENT AND RETROGRADE PYELOGRAM;  Surgeon: Ceasar Mons, MD;  Location: [facility];  Service: Urology;  Laterality: Right;  . CYSTOSCOPY/URETEROSCOPY/HOLMIUM LASER/STENT PLACEMENT Right 05/19/2018   Procedure: CYSTOSCOPY, URETEROSCOPY/HOLMIUM LASER, STONE BASKETRY/ STENT EXCHANGE;  Surgeon: Ceasar Mons, MD;  Location: [facility];  Service: Urology;  Laterality: Right;  . EXCISION MASS LEFT CHEST WALL  09-20-2007   dr Grandville Silos  [facility]  . Radioactive Iodine Therapy     for thyroid   . REVISION TOTAL HIP ARTHROPLASTY Left early 2000s  . TANDEM RING INSERTION  1995   dr Aldean Ast @ [facility]   EUA w/ tandem placement in ovid  (for direct high dose radiation brachytherapy , cervical cancer)  . TOTAL HIP ARTHROPLASTY Left 1990s  . TRANSTHORACIC ECHOCARDIOGRAM  08/07/2017   mild focal basal hypertrophy of the septum,  ef 09-32%, grade 1 diastolic dysfunction/  atrial septum with lipomatous hypertrophy/  trivial PR    Past Medical Hx:  Past Medical History:  Diagnosis Date  . Arthritis   . Bilateral lower extremity edema   . Flank pain   . Full dentures   . History of cervical cancer    11/ 1994  Stage IB  s/p  high dose radiation brachytherapy @ [facility] 01/ 1995--- per pt no recurrence  . History of chronic bronchitis   . History of hyperthyroidism    d 03/ 2011---due to grave's disease--- s/p  RAI treatment 04/ 2011  . History of sepsis 05/03/2018   due to UTI with klebsiella/ pyelonephritis/ ureteral obstruction cause by stone  . Hyperlipidemia   . Hypothyroidism, postradioiodine therapy    endocrinologist--  dr Loanne Drilling--  dx graves disease and s/p RAI i131 treatement 4/ 2011  . Nauseated   . Oral thrush 05/03/2018  . Pneumonia   . Type 2 diabetes mellitus treated with insulin ([place])    FOLLOWED BY PCP  . Urgency of urination   . Wears glasses     Past Gynecological History:  SVD x 2, tubal ligation, cervical cancer (stage IB adenocarcinoma in 1994, treated with primary chemoradiation with no surgery) No LMP recorded. Patient is postmenopausal.  Family Hx:  Family History  Problem Relation Age of Onset  . Emphysema Father   . Diabetes Father   . Emphysema Sister   . Emphysema Brother   . Cancer Mother        Skin Cancer  . Diabetes Mother     Review of Systems:  Constitutional  Feels well,    ENT Normal appearing ears and nares bilaterally Skin/Breast  No rash, sores, jaundice, itching, dryness Cardiovascular  No chest pain, shortness of  breath, or edema  Pulmonary  No cough or wheeze.  Gastro Intestinal  No nausea, vomitting, or diarrhoea. No bright red blood per rectum, no abdominal pain, change in bowel movement, or constipation. + fecal incontinence Genito Urinary  No frequency, urgency, dysuria, + incontinence, no bleeding Musculo Skeletal  No myalgia, arthralgia, joint swelling or pain  Neurologic  No weakness, numbness, change in gait,  Psychology  No depression, anxiety, insomnia.   Vitals:  Blood pressure (!) 174/68, pulse 66, temperature 98.1 F (36.7 C), temperature source Oral, resp. rate 18, height 5' (1.524 m), weight 173 lb (78.5 kg), SpO2 100 %.  Physical Exam: WD in NAD Neck  Supple NROM, without any enlargements.  Lymph Node Survey No cervical supraclavicular or inguinal adenopathy Cardiovascular  Pulse normal rate, regularity and rhythm. S1 and S2 normal.  Lungs  Clear to auscultation bilateraly, without wheezes/crackles/rhonchi. Good air movement.  Skin  No rash/lesions/breakdown  Psychiatry  Alert and oriented to person, place, and time  Abdomen  Normoactive bowel sounds, abdomen soft, non-tender and obese without evidence of hernia.  Back No CVA tenderness Genito Urinary  Vulva/vagina: Normal external female genitalia.   No lesions. No discharge or bleeding. Radiation skin changes  Bladder/urethra:  No lesions or masses, well supported bladder  Vagina: radiation changes, narrow, loss of rugation, agglutination at upper vagina  Cervix: agglutinated and flush with anterior vaginal wall secondary to radiation, no specific lesions.  Uterus:  Difficult to discretely palpate due to radiation fibrosis of the pelvis  Adnexa: no discrete masses. Rectal  Good tone, no masses no cul de sac nodularity.  Extremities  No bilateral cyanosis, clubbing or edema.   Thereasa Solo, MD  03/18/2019, 12:51 PM

## 2019-03-18 NOTE — Telephone Encounter (Signed)
Patient called back and requested that her appt be moved to July. Explained that we will call her once the schedule is out

## 2019-03-19 LAB — CA 125: Cancer Antigen (CA) 125: 13.1 U/mL (ref 0.0–38.1)

## 2019-03-21 ENCOUNTER — Telehealth: Payer: Self-pay

## 2019-03-21 NOTE — Telephone Encounter (Signed)
-----   Message from Dorothyann Gibbs, NP sent at 03/21/2019 11:42 AM EDT ----- Please let her know her CA 125 is normal

## 2019-03-21 NOTE — Telephone Encounter (Signed)
Told Ms Soldo that the CA-125 was normal which is good as noted below by Joylene John, NP. Pt verbalized understanding.

## 2019-03-23 ENCOUNTER — Ambulatory Visit (HOSPITAL_COMMUNITY)
Admission: RE | Admit: 2019-03-23 | Discharge: 2019-03-23 | Disposition: A | Discharge status: Home / Self Care | Payer: Medicare HMO | Source: Ambulatory Visit | Attending: Gynecologic Oncology | Admitting: Gynecologic Oncology

## 2019-03-23 ENCOUNTER — Other Ambulatory Visit: Payer: Self-pay

## 2019-03-23 DIAGNOSIS — R1900 Intra-abdominal and pelvic swelling, mass and lump, unspecified site: Secondary | ICD-10-CM

## 2019-04-01 ENCOUNTER — Telehealth: Payer: Self-pay

## 2019-04-01 NOTE — Telephone Encounter (Signed)
Told [name] that her [place] is scheduled on 06-20-2019 at [facility] at 1430.  She needs to arriveat 1425 in radiology to register.  She needs to arrive with a full bladder. Will call her with the results of [place] and follow up plan. [name] wrote information down and verbalized understanding.

## 2019-04-01 NOTE — Telephone Encounter (Signed)
Told Ms Pyatt that the US shows that the cystic mass could be the uterus with fluid in It. Dr. Denman George would like to still proceed as planned with repeating the [place] in 3 months per Joylene John NP. Pt agreeable to repeat US in August. Pt available on Monday and Friday after 1 pm.

## 2019-05-06 ENCOUNTER — Encounter: Payer: Self-pay | Admitting: Podiatry

## 2019-05-06 ENCOUNTER — Other Ambulatory Visit: Payer: Self-pay

## 2019-05-06 ENCOUNTER — Ambulatory Visit: Payer: Medicare HMO | Admitting: Podiatry

## 2019-05-06 VITALS — Temp 98.7°F

## 2019-05-06 DIAGNOSIS — M79674 Pain in right toe(s): Secondary | ICD-10-CM | POA: Diagnosis not present

## 2019-05-06 DIAGNOSIS — E1142 Type 2 diabetes mellitus with diabetic polyneuropathy: Secondary | ICD-10-CM

## 2019-05-06 DIAGNOSIS — L84 Corns and callosities: Secondary | ICD-10-CM | POA: Diagnosis not present

## 2019-05-06 DIAGNOSIS — B351 Tinea unguium: Secondary | ICD-10-CM | POA: Diagnosis not present

## 2019-05-06 DIAGNOSIS — M79675 Pain in left toe(s): Secondary | ICD-10-CM

## 2019-05-06 NOTE — Patient Instructions (Addendum)
Diabetes Mellitus and Foot Care Foot care is an important part of your health, especially when you have diabetes. Diabetes may cause you to have problems because of poor blood flow (circulation) to your feet and legs, which can cause your skin to:  Become thinner and drier.  Break more easily.  Heal more slowly.  Peel and crack. You may also have nerve damage (neuropathy) in your legs and feet, causing decreased feeling in them. This means that you may not notice minor injuries to your feet that could lead to more serious problems. Noticing and addressing any potential problems early is the best way to prevent future foot problems. How to care for your feet Foot hygiene  Wash your feet daily with warm water and mild soap. Do not use hot water. Then, pat your feet and the areas between your toes until they are completely dry. Do not soak your feet as this can dry your skin.  Trim your toenails straight across. Do not dig under them or around the cuticle. File the edges of your nails with an emery board or nail file.  Apply a moisturizing lotion or petroleum jelly to the skin on your feet and to dry, brittle toenails. Use lotion that does not contain alcohol and is unscented. Do not apply lotion between your toes. Shoes and socks  Wear clean socks or stockings every day. Make sure they are not too tight. Do not wear knee-high stockings since they may decrease blood flow to your legs.  Wear shoes that fit properly and have enough cushioning. Always look in your shoes before you put them on to be sure there are no objects inside.  To break in new shoes, wear them for just a few hours a day. This prevents injuries on your feet. Wounds, scrapes, corns, and calluses  Check your feet daily for blisters, cuts, bruises, sores, and redness. If you cannot see the bottom of your feet, use a mirror or ask someone for help.  Do not cut corns or calluses or try to remove them with medicine.  If you  find a minor scrape, cut, or break in the skin on your feet, keep it and the skin around it clean and dry. You may clean these areas with mild soap and water. Do not clean the area with peroxide, alcohol, or iodine.  If you have a wound, scrape, corn, or callus on your foot, look at it several times a day to make sure it is healing and not infected. Check for: ? Redness, swelling, or pain. ? Fluid or blood. ? Warmth. ? Pus or a bad smell. General instructions  Do not cross your legs. This may decrease blood flow to your feet.  Do not use heating pads or hot water bottles on your feet. They may burn your skin. If you have lost feeling in your feet or legs, you may not know this is happening until it is too late.  Protect your feet from hot and cold by wearing shoes, such as at the beach or on hot pavement.  Schedule a complete foot exam at least once a year (annually) or more often if you have foot problems. If you have foot problems, report any cuts, sores, or bruises to your health care provider immediately. Contact a health care provider if:  You have a medical condition that increases your risk of infection and you have any cuts, sores, or bruises on your feet.  You have an injury that is not   healing.  You have redness on your legs or feet.  You feel burning or tingling in your legs or feet.  You have pain or cramps in your legs and feet.  Your legs or feet are numb.  Your feet always feel cold.  You have pain around a toenail. Get help right away if:  You have a wound, scrape, corn, or callus on your foot and: ? You have pain, swelling, or redness that gets worse. ? You have fluid or blood coming from the wound, scrape, corn, or callus. ? Your wound, scrape, corn, or callus feels warm to the touch. ? You have pus or a bad smell coming from the wound, scrape, corn, or callus. ? You have a fever. ? You have a red line going up your leg. Summary  Check your feet every day  for cuts, sores, red spots, swelling, and blisters.  Moisturize feet and legs daily.  Wear shoes that fit properly and have enough cushioning.  If you have foot problems, report any cuts, sores, or bruises to your health care provider immediately.  Schedule a complete foot exam at least once a year (annually) or more often if you have foot problems. This information is not intended to replace advice given to you by your health care provider. Make sure you discuss any questions you have with your health care provider. Document Released: 10/31/2000 Document Revised: 12/16/2017 Document Reviewed: 12/05/2016 Elsevier Interactive Patient Education  2019 [employer]  Onychomycosis/Fungal Toenails  WHAT IS IT? An infection that lies within the keratin of your nail plate that is caused by a fungus.  WHY ME? Fungal infections affect all ages, sexes, races, and creeds.  There may be many factors that predispose you to a fungal infection such as age, coexisting medical conditions such as diabetes, or an autoimmune disease; stress, medications, fatigue, genetics, etc.  Bottom line: fungus thrives in a warm, moist environment and your shoes offer such a location.  IS IT CONTAGIOUS? Theoretically, yes.  You do not want to share shoes, nail clippers or files with someone who has fungal toenails.  Walking around barefoot in the same room or sleeping in the same bed is unlikely to transfer the organism.  It is important to realize, however, that fungus can spread easily from one nail to the next on the same foot.  HOW DO WE TREAT THIS?  There are several ways to treat this condition.  Treatment may depend on many factors such as age, medications, pregnancy, liver and kidney conditions, etc.  It is best to ask your doctor which options are available to you.  1. No treatment.   Unlike many other medical concerns, you can live with this condition.  However for many people this can be a painful condition and  may lead to ingrown toenails or a bacterial infection.  It is recommended that you keep the nails cut short to help reduce the amount of fungal nail. 2. Topical treatment.  These range from herbal remedies to prescription strength nail lacquers.  About 40-50% effective, topicals require twice daily application for approximately 9 to 12 months or until an entirely new nail has grown out.  The most effective topicals are medical grade medications available through physicians offices. 3. Oral antifungal medications.  With an 80-90% cure rate, the most common oral medication requires 3 to 4 months of therapy and stays in your system for a year as the new nail grows out.  Oral antifungal medications do require   blood work to make sure it is a safe drug for you.  A liver function panel will be performed prior to starting the medication and after the first month of treatment.  It is important to have the blood work performed to avoid any harmful side effects.  In general, this medication safe but blood work is required. 4. Laser Therapy.  This treatment is performed by applying a specialized laser to the affected nail plate.  This therapy is noninvasive, fast, and non-painful.  It is not covered by insurance and is therefore, out of pocket.  The results have been very good with a 80-95% cure rate.  The [facility] is the only practice in the area to offer this therapy. 5. Permanent Nail Avulsion.  Removing the entire nail so that a new nail will not grow back. 

## 2019-05-17 NOTE — Progress Notes (Signed)
Subjective: [name] presents today with history of diabetic neuropathy. Patient seen for follow up of chronic, painful mycotic toenails preulcerative callus which pose risk and interfere with daily activities and routine tasks.  Pain is aggravated when wearing enclosed shoe gear. Pain is getting progressively worse and relieved with periodic professional debridement.   Everardo Beals, NP is her PCP. Last visit 03/18/2019.   Current Outpatient Medications:  .  alendronate (FOSAMAX) 10 MG tablet, alendronate 10 mg tablet, Disp: , Rfl:  .  ALPRAZolam (XANAX) 0.25 MG tablet, Take 1 tablet by mouth at bedtime., Disp: , Rfl:  .  aspirin EC 81 MG tablet, Take 81 mg by mouth daily., Disp: , Rfl:  .  baclofen (LIORESAL) 10 MG tablet, Take 10 mg by mouth 4 (four) times daily., Disp: , Rfl:  .  ergocalciferol (VITAMIN D2) 50000 UNITS capsule, Take 50,000 Units by mouth once a week. Sundays, Disp: , Rfl:  .  estradiol (ESTRACE) 0.1 MG/GM vaginal cream, APPLY A PEA SIZED AMOUNT AROUND THE URETHRA AND VAGINAL OPENING ONCE DAILY FOR 2 WEEKS THEN TRANSITION TO EVERY OTHER DAY, Disp: , Rfl:  .  gabapentin (NEURONTIN) 100 MG capsule, Take 300 mg by mouth 2 (two) times daily. , Disp: , Rfl:  .  gemfibrozil (LOPID) 600 MG tablet, Take 600 mg by mouth 2 (two) times daily.  , Disp: , Rfl:  .  glipiZIDE (GLUCOTROL XL) 10 MG 24 hr tablet, glipizide ER 10 mg tablet, extended release 24 hr, Disp: , Rfl:  .  insulin degludec (TRESIBA FLEXTOUCH) 100 UNIT/ML SOPN FlexTouch Pen, Tresiba FlexTouch U-100 insulin 100 unit/mL (3 mL) subcutaneous pen  INJECT 40 UNITS SUBCUTANEOUSLY ONCE DAILY AT BEDTIME FOR 30 DAYS, Disp: , Rfl:  .  levothyroxine (SYNTHROID) 125 MCG tablet, , Disp: , Rfl:  .  metFORMIN (GLUCOPHAGE) 500 MG tablet, Take 500 mg by mouth 2 (two) times daily with a meal., Disp: , Rfl:  .  methocarbamol (ROBAXIN) 500 MG tablet, TAKE 1 TABLET BY MOUTH TWICE DAILY AS NEEDED FOR LEG CRAMPS, Disp: , Rfl:  .   oxybutynin (DITROPAN-XL) 10 MG 24 hr tablet, oxybutynin chloride ER 10 mg tablet,extended release 24 hr  TAKE 1 TABLET BY MOUTH ONCE DAILY, Disp: , Rfl:  .  oxycodone (OXY-IR) 5 MG capsule, Take 5 mg by mouth every 12 (twelve) hours., Disp: , Rfl:  .  phenazopyridine (PYRIDIUM) 200 MG tablet, phenazopyridine 200 mg tablet  TAKE 1 TABLET BY MOUTH THREE TIMES DAILY AS NEEDED FOR PAIN WITH URINATION, Disp: , Rfl:  .  XTAMPZA ER 9 MG C12A, TAKE 1 CAPSULE BY MOUTH EVERY 12 HOURS, Disp: , Rfl:   Current Facility-Administered Medications:  .  0.9 %  sodium chloride infusion, 500 mL, Intravenous, Once, [place], MD  Allergies  Allergen Reactions  . Codeine Nausea Only    Objective: Vitals:   05/06/19 1315  Temp: 98.7 F (37.1 C)    Vascular Examination: Capillary refill time <3 seconds x 10 digits.  Dorsalis pedis pulses present b/l.  Posterior tibial pulses present b/l.  No digital hair x 10 digits.  Skin temperature WNL b/l.  Dermatological Examination: Skin with normal turgor, texture and tone b/l.  Toenails 1-5 b/l discolored, thick, dystrophic with subungual debris and pain with palpation to nailbeds due to thickness of nails.  Hyperkeratotic lesion(s) left hallux plantar IPJ with subdermal hemorrhage. No erythema, no edema, no drainage, no flocculence noted.   Musculoskeletal: Muscle strength 5/5 to all LE muscle  groups.  Hammertoe 2nd digits b/l.  Neurological: Sensation diminished with 10 gram monofilament.  Vibratory sensation diminished b/l.  Assessment: 1. Painful onychomycosis toenails 1-5 b/l 2. Preulcerative callus left hallux 3. NIDDM with neuropathy   Plan: 1. Toenails 1-5 b/l were debrided in length and girth without iatrogenic bleeding. 2. Calluses pared left hallux utilizing sterile scalpel blade without incident. Dispensed digital toe cap for daily protection. 3. Patient to continue soft, supportive shoe gear daily. 4. Patient to report  any pedal injuries to medical professional immediately. 5. Follow up 3 months.  6. Patient/POA to call should there be a concern in the interim.

## 2019-06-02 ENCOUNTER — Telehealth: Payer: Self-pay | Admitting: *Deleted

## 2019-06-02 NOTE — Telephone Encounter (Signed)
Per office request I have fax last office note to Dr. Lovena Neighbours

## 2019-06-13 ENCOUNTER — Telehealth: Payer: Self-pay | Admitting: *Deleted

## 2019-06-13 NOTE — Telephone Encounter (Signed)
Called and spoke with the patient regarding her scan on 8/3. Also gave her the follow up appt on 8/7

## 2019-06-20 ENCOUNTER — Other Ambulatory Visit: Payer: Self-pay | Admitting: Gynecologic Oncology

## 2019-06-20 ENCOUNTER — Encounter (HOSPITAL_COMMUNITY): Payer: Self-pay

## 2019-06-20 ENCOUNTER — Other Ambulatory Visit: Payer: Self-pay

## 2019-06-20 ENCOUNTER — Ambulatory Visit (HOSPITAL_COMMUNITY)
Admission: RE | Admit: 2019-06-20 | Discharge: 2019-06-20 | Disposition: A | Discharge status: Home / Self Care | Payer: Medicare HMO | Source: Ambulatory Visit | Attending: Gynecologic Oncology | Admitting: Gynecologic Oncology

## 2019-06-20 DIAGNOSIS — R1900 Intra-abdominal and pelvic swelling, mass and lump, unspecified site: Secondary | ICD-10-CM

## 2019-06-20 NOTE — Progress Notes (Unsigned)
MRI pelvis ordered since [place] non-diagnostic.

## 2019-06-21 ENCOUNTER — Telehealth: Payer: Self-pay

## 2019-06-21 NOTE — Telephone Encounter (Signed)
Told Ms Genther that the [place] was non diagnostic per Joylene John, NP. Radiologist recommends a MRI of the Pelvis with and with out contrast  To characterize the pelvic mass previously identified.  Pt will have labs on 06-24-19 at 2pm- B-met. MRI on 06-29-19 at 7 pm. See Dr. Denman George to discuss results on 07-08-19 at  2:30 pm. Pt wrote appointments down.  She will call to r/s 07-08-19 if she cannot get transportation.

## 2019-06-24 ENCOUNTER — Inpatient Hospital Stay: Payer: Medicare HMO | Admitting: Gynecologic Oncology

## 2019-06-24 ENCOUNTER — Other Ambulatory Visit: Payer: Self-pay

## 2019-06-24 ENCOUNTER — Inpatient Hospital Stay: Payer: Medicare HMO | Attending: Gynecologic Oncology

## 2019-06-24 DIAGNOSIS — Z8541 Personal history of malignant neoplasm of cervix uteri: Secondary | ICD-10-CM | POA: Insufficient documentation

## 2019-06-24 DIAGNOSIS — E785 Hyperlipidemia, unspecified: Secondary | ICD-10-CM | POA: Diagnosis not present

## 2019-06-24 DIAGNOSIS — R1900 Intra-abdominal and pelvic swelling, mass and lump, unspecified site: Secondary | ICD-10-CM | POA: Diagnosis not present

## 2019-06-24 DIAGNOSIS — Z794 Long term (current) use of insulin: Secondary | ICD-10-CM | POA: Insufficient documentation

## 2019-06-24 DIAGNOSIS — E119 Type 2 diabetes mellitus without complications: Secondary | ICD-10-CM | POA: Diagnosis not present

## 2019-06-24 DIAGNOSIS — Z79899 Other long term (current) drug therapy: Secondary | ICD-10-CM | POA: Diagnosis not present

## 2019-06-24 DIAGNOSIS — N3090 Cystitis, unspecified without hematuria: Secondary | ICD-10-CM | POA: Insufficient documentation

## 2019-06-24 DIAGNOSIS — Z87891 Personal history of nicotine dependence: Secondary | ICD-10-CM | POA: Insufficient documentation

## 2019-06-24 LAB — BASIC METABOLIC PANEL
Anion gap: 15 (ref 5–15)
BUN: 12 mg/dL (ref 8–23)
CO2: 22 mmol/L (ref 22–32)
Calcium: 8.9 mg/dL (ref 8.9–10.3)
Chloride: 107 mmol/L (ref 98–111)
Creatinine, Ser: 1.02 mg/dL — ABNORMAL HIGH (ref 0.44–1.00)
GFR calc Af Amer: >60 mL/min (ref >60)
GFR calc non Af Amer: 55 mL/min — ABNORMAL LOW (ref >60)
Glucose, Bld: 181 mg/dL — ABNORMAL HIGH (ref 70–99)
Potassium: 4.1 mmol/L (ref 3.5–5.1)
Sodium: 144 mmol/L (ref 135–145)

## 2019-06-29 ENCOUNTER — Ambulatory Visit (HOSPITAL_COMMUNITY)
Admission: RE | Admit: 2019-06-29 | Discharge: 2019-06-29 | Disposition: A | Discharge status: Home / Self Care | Payer: Medicare HMO | Source: Ambulatory Visit | Attending: Gynecologic Oncology | Admitting: Gynecologic Oncology

## 2019-06-29 ENCOUNTER — Other Ambulatory Visit: Payer: Self-pay

## 2019-06-29 DIAGNOSIS — R1900 Intra-abdominal and pelvic swelling, mass and lump, unspecified site: Secondary | ICD-10-CM | POA: Insufficient documentation

## 2019-06-29 MED ORDER — GADOBUTROL 1 MMOL/ML IV SOLN
8.0000 mL | Freq: Once | INTRAVENOUS | Status: AC | PRN
  Administered 2019-06-29: 8 mL via INTRAVENOUS

## 2019-07-08 ENCOUNTER — Inpatient Hospital Stay (HOSPITAL_BASED_OUTPATIENT_CLINIC_OR_DEPARTMENT_OTHER): Payer: Medicare HMO | Admitting: Gynecologic Oncology

## 2019-07-08 ENCOUNTER — Other Ambulatory Visit: Payer: Self-pay

## 2019-07-08 ENCOUNTER — Encounter: Payer: Self-pay | Admitting: Gynecologic Oncology

## 2019-07-08 VITALS — BP 165/67 | HR 72 | Temp 98.9°F | Resp 18 | Ht 60.0 in | Wt 165.4 lb

## 2019-07-08 DIAGNOSIS — R1900 Intra-abdominal and pelvic swelling, mass and lump, unspecified site: Secondary | ICD-10-CM

## 2019-07-08 DIAGNOSIS — Z8541 Personal history of malignant neoplasm of cervix uteri: Secondary | ICD-10-CM | POA: Diagnosis not present

## 2019-07-08 DIAGNOSIS — N3090 Cystitis, unspecified without hematuria: Secondary | ICD-10-CM | POA: Diagnosis not present

## 2019-07-08 MED ORDER — NITROFURANTOIN MONOHYD MACRO 100 MG PO CAPS: 100.0000 mg | ORAL_CAPSULE | Freq: Two times a day (BID) | ORAL | 1 refills | Status: DC

## 2019-07-08 NOTE — Patient Instructions (Addendum)
The MRI showed that the "mass" seen on CT scan is actually your uterus.  Dr Denman George recommends one more reassessment to ensure it is not enlarging. She has orcdered an ultrasound for 6 months time. She will see you after that visit.  Please contact Dr Serita Grit office (at [phone]) in January, 2021 to request an appointment with her for after your scheduled ultrasound.  She has presribed you macrobid to take morning and night for 7 days. If this does not improve your bladder symptoms, please call Dr Jackson Latino office.   Dr Denman George can be reached at [phone].

## 2019-07-08 NOTE — Progress Notes (Signed)
Follow-up Note: Gyn-Onc  Consult was requested by Dr. Lovena Neighbours for the evaluation of DAZHANE VILLAGOMEZ 73 y.o. female  CC:  Chief Complaint  Patient presents with  . history of cervical cancer  . hematometria  . pelvic mass    Assessment/Plan:  Ms. ROLAND PRINE  is a 73 y.o.  year old with a hematometrium from cervical stenosis seen as a pelvic mass seen on CT imaging for ureteral stones.  I am recommending repeat imaging in 6 months to evaluate for stablity. If it is continuing to grow/accumulate fluid, then I recommend consideration of D&C under US guidance. If it remains stable, would stop imaging follow-up.  She has symptoms of cystitis. Could not supply sample for culture today. Prescribed empiric macrobid.  HPI: Ms. Kinslee Dalpe is a 73 year old P2 who was seen in consultation at the request of Dr. Lovena Neighbours for a pelvic mass.  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  It was treated at [facility] with primary chemoradiation under the care of Dr. Cathrine Muster.  We reviewed the pathology notes from her biopsy and discharge summary from her admission for intracavitary low dose rate brachii therapy with cesium.  Following treatment from that she had complete clinical response and has remained NED since that time.  From what she reports to me it sounds like she had uterine fibroids identified at the time of treatment of her cervical cancer.  She has had no issues with bleeding since that time.  Paps and biopsies of the vagina since that time of only showing radiation changes but no dysplasia or malignancy.  In June 2019 she developed urosepsis with a right ureteral stone.  She was treated with cystoscopic retrieval of the stone and stent placement for a period of time.  CT imaging was performed at that time.  It was repeated in April 2020 for surveillance where the stone in the ureter had moved to the bladder and  did not require any intervention.  On that repeat CT scan the radiologist's note that she has a history of ovarian cancer in the clinical data and commented upon a 6.7 x 6 cm low density within the central right hemipelvis.  They state that this is increased in size from 5.3 x 4.4 cm on the prior CT scan which was the CT performed in June 2019 at the time of her urosepsis.  They do not comment on the presence of a uterus, presumably because they assumed it had been removed surgically with her diagnosis of ovarian cancer.  The patient denies vaginal bleeding, abdominal bloating, change in bladder or bowel habit.  She does have urinary and fecal incontinence secondary to her radiation.  She has no family history concerning for breast or ovarian cancer.  She has a history of a colonoscopy with benign polyp resection.  Her surgical history is significant for an appendectomy, tubal ligation via laparotomy, hip arthroplasty, and cystoscopy with stent placement in 2019.  Interval Hx:   After reviewing her history we felt this was most likely to be a uterus that was being misinterpreted as a pelvic mass on CT imaging, and ordered a transvaginal ultrasound to better evaluate.  This exam was poorly tolerated by the patient and therefore an MRI was instead performed.  This was performed on June 29, 2019.  It showed a uterus notable for an endometrial cavity distended with fluid and debris accounting for the masslike appearance on CT  scan.  There was no convincing enhancement on postcontrast subtraction imaging.  The appearance was compatible with hematometra likely secondary to acquired cervical stenosis.  The patient continues to have no bleeding.  She has persistent pelvic discomfort which is not a long-term issue.  She has new symptoms of cystitis, suprapubic pain, and pain with micturition.  Current Meds:  Outpatient Encounter Medications as of 07/08/2019  Medication Sig  . ALPRAZolam (XANAX) 0.25 MG  tablet Take 1 tablet by mouth at bedtime.  . baclofen (LIORESAL) 10 MG tablet Take 10 mg by mouth 4 (four) times daily.  . ergocalciferol (VITAMIN D2) 50000 UNITS capsule Take 50,000 Units by mouth once a week. Sundays  . gabapentin (NEURONTIN) 100 MG capsule Take 300 mg by mouth 2 (two) times daily.   Marland Kitchen gemfibrozil (LOPID) 600 MG tablet Take 600 mg by mouth 2 (two) times daily.    . insulin degludec (TRESIBA FLEXTOUCH) 100 UNIT/ML SOPN FlexTouch Pen Tresiba FlexTouch U-100 insulin 100 unit/mL (3 mL) subcutaneous pen  INJECT 40 UNITS SUBCUTANEOUSLY ONCE DAILY AT BEDTIME FOR 30 DAYS  . levothyroxine (SYNTHROID) 125 MCG tablet   . metFORMIN (GLUCOPHAGE) 500 MG tablet Take 500 mg by mouth 2 (two) times daily with a meal.  . methocarbamol (ROBAXIN) 500 MG tablet TAKE 1 TABLET BY MOUTH TWICE DAILY AS NEEDED FOR LEG CRAMPS  . oxycodone (OXY-IR) 5 MG capsule Take 5 mg by mouth every 12 (twelve) hours.  Marland Kitchen alendronate (FOSAMAX) 10 MG tablet alendronate 10 mg tablet  . nitrofurantoin, macrocrystal-monohydrate, (MACROBID) 100 MG capsule Take 1 capsule (100 mg total) by mouth 2 (two) times daily.  . [DISCONTINUED] aspirin EC 81 MG tablet Take 81 mg by mouth daily.  . [DISCONTINUED] estradiol (ESTRACE) 0.1 MG/GM vaginal cream APPLY A PEA SIZED AMOUNT AROUND THE URETHRA AND VAGINAL OPENING ONCE DAILY FOR 2 WEEKS THEN TRANSITION TO EVERY OTHER DAY  . [DISCONTINUED] glipiZIDE (GLUCOTROL XL) 10 MG 24 hr tablet glipizide ER 10 mg tablet, extended release 24 hr  . [DISCONTINUED] oxybutynin (DITROPAN-XL) 10 MG 24 hr tablet oxybutynin chloride ER 10 mg tablet,extended release 24 hr  TAKE 1 TABLET BY MOUTH ONCE DAILY  . [DISCONTINUED] phenazopyridine (PYRIDIUM) 200 MG tablet phenazopyridine 200 mg tablet  TAKE 1 TABLET BY MOUTH THREE TIMES DAILY AS NEEDED FOR PAIN WITH URINATION  . [DISCONTINUED] XTAMPZA ER 9 MG C12A TAKE 1 CAPSULE BY MOUTH EVERY 12 HOURS   Facility-Administered Encounter Medications as of 07/08/2019   Medication  . 0.9 %  sodium chloride infusion    Allergy:  Allergies  Allergen Reactions  . Codeine Nausea Only    Social Hx:   Social History   Socioeconomic History  . Marital status: Married    Spouse name: Not on file  . Number of children: 2  . Years of education: Not on file  . Highest education level: Not on file  Occupational History  . Occupation: Disabled  Social Needs  . Financial resource strain: Not on file  . Food insecurity    Worry: Not on file    Inability: Not on file  . Transportation needs    Medical: Not on file    Non-medical: Not on file  Tobacco Use  . Smoking status: Former Smoker    Years: 5.00    Types: Cigarettes    Quit date: 05/13/1982    Years since quitting: 37.1  . Smokeless tobacco: Never Used  Substance and Sexual Activity  . Alcohol use: No  . Drug  use: No  . Sexual activity: Not Currently  Lifestyle  . Physical activity    Days per week: Not on file    Minutes per session: Not on file  . Stress: Not on file  Relationships  . Social Herbalist on phone: Not on file    Gets together: Not on file    Attends religious service: Not on file    Active member of club or organization: Not on file    Attends meetings of clubs or organizations: Not on file    Relationship status: Not on file  . Intimate partner violence    Fear of current or ex partner: Not on file    Emotionally abused: Not on file    Physically abused: Not on file    Forced sexual activity: Not on file  Other Topics Concern  . Not on file  Social History Narrative   Married and lives with husband   Has children   Housewife   disabled    Past Surgical Hx:  Past Surgical History:  Procedure Laterality Date  . APPENDECTOMY    . CATARACT EXTRACTION W/ INTRAOCULAR LENS  IMPLANT, BILATERAL  2004  approx.  . CYSTOSCOPY WITH STENT PLACEMENT Right 05/04/2018   Procedure: CYSTOSCOPY WITH STENT PLACEMENT AND RETROGRADE PYELOGRAM;  Surgeon: Ceasar Mons, MD;  Location: [facility];  Service: Urology;  Laterality: Right;  . CYSTOSCOPY/URETEROSCOPY/HOLMIUM LASER/STENT PLACEMENT Right 05/19/2018   Procedure: CYSTOSCOPY, URETEROSCOPY/HOLMIUM LASER, STONE BASKETRY/ STENT EXCHANGE;  Surgeon: Ceasar Mons, MD;  Location: [facility];  Service: Urology;  Laterality: Right;  . EXCISION MASS LEFT CHEST WALL  09-20-2007   dr Grandville Silos  [facility]  . Radioactive Iodine Therapy     for thyroid  . REVISION TOTAL HIP ARTHROPLASTY Left early 2000s  . TANDEM RING INSERTION  1995   dr Aldean Ast @ [facility]   EUA w/ tandem placement in ovid  (for direct high dose radiation brachytherapy , cervical cancer)  . TOTAL HIP ARTHROPLASTY Left 1990s  . TRANSTHORACIC ECHOCARDIOGRAM  08/07/2017   mild focal basal hypertrophy of the septum,  ef 36-14%, grade 1 diastolic dysfunction/  atrial septum with lipomatous hypertrophy/  trivial PR    Past Medical Hx:  Past Medical History:  Diagnosis Date  . Arthritis   . Bilateral lower extremity edema   . Flank pain   . Full dentures   . History of cervical cancer    11/ 1994  Stage IB  s/p  high dose radiation brachytherapy @ [facility] 01/ 1995--- per pt no recurrence  . History of chronic bronchitis   . History of hyperthyroidism    d 03/ 2011---due to grave's disease--- s/p  RAI treatment 04/ 2011  . History of sepsis 05/03/2018   due to UTI with klebsiella/ pyelonephritis/ ureteral obstruction cause by stone  . Hyperlipidemia   . Hypothyroidism, postradioiodine therapy    endocrinologist--  dr Loanne Drilling--  dx graves disease and s/p RAI i131 treatement 4/ 2011  . Nauseated   . Oral thrush 05/03/2018  . Pneumonia   . Type 2 diabetes mellitus treated with insulin ([place])    FOLLOWED BY PCP  . Urgency of urination   . Wears glasses     Past Gynecological History:  SVD x 2, tubal ligation, cervical cancer (stage IB adenocarcinoma in 1994, treated with primary chemoradiation with no surgery)  No LMP recorded. Patient is postmenopausal.  Family Hx:  Family History  Problem Relation Age  of Onset  . Emphysema Father   . Diabetes Father   . Emphysema Sister   . Emphysema Brother   . Cancer Mother        Skin Cancer  . Diabetes Mother     Review of Systems:  Constitutional  Feels well,    ENT Normal appearing ears and nares bilaterally Skin/Breast  No rash, sores, jaundice, itching, dryness Cardiovascular  No chest pain, shortness of breath, or edema  Pulmonary  No cough or wheeze.  Gastro Intestinal  No nausea, vomitting, or diarrhoea. No bright red blood per rectum, no abdominal pain, change in bowel movement, or constipation. + fecal incontinence Genito Urinary  No frequency, urgency, dysuria, + incontinence, no bleeding Musculo Skeletal  No myalgia, arthralgia, joint swelling or pain  Neurologic  No weakness, numbness, change in gait,  Psychology  No depression, anxiety, insomnia.   Vitals:  Blood pressure (!) 165/67, pulse 72, temperature 98.9 F (37.2 C), temperature source Oral, resp. rate 18, height 5' (1.524 m), weight 165 lb 6.4 oz (75 kg), SpO2 100 %.  Physical Exam: WD in NAD Neck  Supple NROM, without any enlargements.  Lymph Node Survey No cervical supraclavicular or inguinal adenopathy Cardiovascular  Pulse normal rate, regularity and rhythm. S1 and S2 normal.  Lungs  Clear to auscultation bilateraly, without wheezes/crackles/rhonchi. Good air movement.  Skin  No rash/lesions/breakdown  Psychiatry  Alert and oriented to person, place, and time  Abdomen  Normoactive bowel sounds, abdomen soft, non-tender and obese without evidence of hernia.  Back No CVA tenderness Genito Urinary  Vulva/vagina: Normal external female genitalia.   No lesions. No discharge or bleeding. Radiation skin changes  Bladder/urethra:  No lesions or masses, well supported bladder  Vagina: radiation changes, narrow, loss of rugation, agglutination at upper  vagina  Cervix: agglutinated and flush with anterior vaginal wall secondary to radiation, no specific lesions.  Uterus:  Difficult to discretely palpate due to radiation fibrosis of the pelvis  Adnexa: no discrete masses. Rectal  Good tone, no masses no cul de sac nodularity.  Extremities  No bilateral cyanosis, clubbing or edema.   Thereasa Solo, MD  07/08/2019, 3:22 PM

## 2019-07-13 ENCOUNTER — Telehealth: Payer: Self-pay | Admitting: *Deleted

## 2019-07-13 NOTE — Telephone Encounter (Signed)
Patient's daughter called and stated "[name] saw Dr. Denman George last week and was given antibiotics. Then she saw Dr. Lovena Neighbours on Monday and was given a stringer antibiotic. She has had N/VD for a week. The vomiting is brownish yellow and the diarrhea is a dark color." message given to the nurse

## 2019-07-13 NOTE — Telephone Encounter (Signed)
Spoke with daughter. [name] has been vomiting for several several days.  Saw PA with Dr. Jackson Latino office on 07-11-19. UA done. Given stronger ATB.  She is having jet black diarrhea and vomiting up black/brown/yellow material the last few days. Abdomen hurts. Told [name] that her mother may need to go to the ED to be evaluated for a GI bleed. They need to discuss symptoms with Dr. Gilford Rile to be sure not related to urinary issue. They are waiting on a call from Dr. Jackson Latino office. Told Daughter that the MRI showed that the mass seen on the CT scan is actually her uterus.  No recurrence of her cervical cancer noted.

## 2019-07-14 ENCOUNTER — Ambulatory Visit (HOSPITAL_COMMUNITY): Payer: Medicare HMO

## 2019-07-26 ENCOUNTER — Other Ambulatory Visit: Payer: Self-pay

## 2019-07-26 ENCOUNTER — Observation Stay (HOSPITAL_COMMUNITY)
Admission: EM | Admit: 2019-07-26 | Discharge: 2019-07-27 | Disposition: A | Discharge status: Home / Self Care | Payer: Medicare HMO | Attending: Internal Medicine | Admitting: Internal Medicine

## 2019-07-26 ENCOUNTER — Encounter (HOSPITAL_COMMUNITY): Payer: Self-pay

## 2019-07-26 ENCOUNTER — Emergency Department (HOSPITAL_COMMUNITY): Payer: Medicare HMO

## 2019-07-26 DIAGNOSIS — I700 Atherosclerosis of aorta: Secondary | ICD-10-CM | POA: Diagnosis not present

## 2019-07-26 DIAGNOSIS — M1990 Unspecified osteoarthritis, unspecified site: Secondary | ICD-10-CM | POA: Insufficient documentation

## 2019-07-26 DIAGNOSIS — R197 Diarrhea, unspecified: Secondary | ICD-10-CM | POA: Diagnosis not present

## 2019-07-26 DIAGNOSIS — E785 Hyperlipidemia, unspecified: Secondary | ICD-10-CM | POA: Diagnosis not present

## 2019-07-26 DIAGNOSIS — I10 Essential (primary) hypertension: Secondary | ICD-10-CM | POA: Diagnosis not present

## 2019-07-26 DIAGNOSIS — Z794 Long term (current) use of insulin: Secondary | ICD-10-CM | POA: Insufficient documentation

## 2019-07-26 DIAGNOSIS — R109 Unspecified abdominal pain: Secondary | ICD-10-CM | POA: Insufficient documentation

## 2019-07-26 DIAGNOSIS — K5090 Crohn's disease, unspecified, without complications: Secondary | ICD-10-CM | POA: Insufficient documentation

## 2019-07-26 DIAGNOSIS — Z8541 Personal history of malignant neoplasm of cervix uteri: Secondary | ICD-10-CM | POA: Insufficient documentation

## 2019-07-26 DIAGNOSIS — E1169 Type 2 diabetes mellitus with other specified complication: Secondary | ICD-10-CM | POA: Diagnosis present

## 2019-07-26 DIAGNOSIS — Z79899 Other long term (current) drug therapy: Secondary | ICD-10-CM | POA: Diagnosis not present

## 2019-07-26 DIAGNOSIS — E039 Hypothyroidism, unspecified: Secondary | ICD-10-CM | POA: Diagnosis present

## 2019-07-26 DIAGNOSIS — Z20828 Contact with and (suspected) exposure to other viral communicable diseases: Secondary | ICD-10-CM | POA: Insufficient documentation

## 2019-07-26 DIAGNOSIS — E876 Hypokalemia: Secondary | ICD-10-CM | POA: Diagnosis not present

## 2019-07-26 DIAGNOSIS — Z96642 Presence of left artificial hip joint: Secondary | ICD-10-CM | POA: Diagnosis not present

## 2019-07-26 DIAGNOSIS — E119 Type 2 diabetes mellitus without complications: Secondary | ICD-10-CM

## 2019-07-26 DIAGNOSIS — N857 Hematometra: Secondary | ICD-10-CM | POA: Insufficient documentation

## 2019-07-26 DIAGNOSIS — R161 Splenomegaly, not elsewhere classified: Secondary | ICD-10-CM | POA: Insufficient documentation

## 2019-07-26 DIAGNOSIS — J439 Emphysema, unspecified: Secondary | ICD-10-CM | POA: Insufficient documentation

## 2019-07-26 DIAGNOSIS — Z87891 Personal history of nicotine dependence: Secondary | ICD-10-CM | POA: Insufficient documentation

## 2019-07-26 DIAGNOSIS — Z79890 Hormone replacement therapy (postmenopausal): Secondary | ICD-10-CM | POA: Diagnosis not present

## 2019-07-26 DIAGNOSIS — R112 Nausea with vomiting, unspecified: Secondary | ICD-10-CM | POA: Diagnosis present

## 2019-07-26 LAB — COMPREHENSIVE METABOLIC PANEL
ALT: 10 U/L (ref 0–44)
AST: 18 U/L (ref 15–41)
Albumin: 3.3 g/dL — ABNORMAL LOW (ref 3.5–5.0)
Alkaline Phosphatase: 155 U/L — ABNORMAL HIGH (ref 38–126)
Anion gap: 13 (ref 5–15)
BUN: 13 mg/dL (ref 8–23)
CO2: 27 mmol/L (ref 22–32)
Calcium: 9 mg/dL (ref 8.9–10.3)
Chloride: 99 mmol/L (ref 98–111)
Creatinine, Ser: 0.92 mg/dL (ref 0.44–1.00)
GFR calc Af Amer: >60 mL/min (ref >60)
GFR calc non Af Amer: >60 mL/min (ref >60)
Glucose, Bld: 150 mg/dL — ABNORMAL HIGH (ref 70–99)
Potassium: 2.5 mmol/L — CL (ref 3.5–5.1)
Sodium: 139 mmol/L (ref 135–145)
Total Bilirubin: 1.1 mg/dL (ref 0.3–1.2)
Total Protein: 8 g/dL (ref 6.5–8.1)

## 2019-07-26 LAB — URINALYSIS, ROUTINE W REFLEX MICROSCOPIC
Bacteria, UA: NONE SEEN
Bilirubin Urine: NEGATIVE
Glucose, UA: NEGATIVE mg/dL
Hgb urine dipstick: NEGATIVE
Ketones, ur: 20 mg/dL — AB
Nitrite: NEGATIVE
Protein, ur: 30 mg/dL — AB
Specific Gravity, Urine: 1.023 (ref 1.005–1.030)
pH: 5 (ref 5.0–8.0)

## 2019-07-26 LAB — CBC WITH DIFFERENTIAL/PLATELET
Abs Immature Granulocytes: 0.03 10*3/uL (ref 0.00–0.07)
Basophils Absolute: 0 10*3/uL (ref 0.0–0.1)
Basophils Relative: 0 %
Eosinophils Absolute: 0 10*3/uL (ref 0.0–0.5)
Eosinophils Relative: 1 %
HCT: 39.3 % (ref 36.0–46.0)
Hemoglobin: 12.3 g/dL (ref 12.0–15.0)
Immature Granulocytes: 0 %
Lymphocytes Relative: 17 %
Lymphs Abs: 1.3 10*3/uL (ref 0.7–4.0)
MCH: 27.4 pg (ref 26.0–34.0)
MCHC: 31.3 g/dL (ref 30.0–36.0)
MCV: 87.5 fL (ref 80.0–100.0)
Monocytes Absolute: 0.4 10*3/uL (ref 0.1–1.0)
Monocytes Relative: 6 %
Neutro Abs: 6 10*3/uL (ref 1.7–7.7)
Neutrophils Relative %: 76 %
Platelets: 336 10*3/uL (ref 150–400)
RBC: 4.49 MIL/uL (ref 3.87–5.11)
RDW: 13.9 % (ref 11.5–15.5)
WBC: 7.8 10*3/uL (ref 4.0–10.5)
nRBC: 0 % (ref 0.0–0.2)

## 2019-07-26 LAB — POC OCCULT BLOOD, ED: Fecal Occult Bld: NEGATIVE

## 2019-07-26 LAB — MAGNESIUM: Magnesium: 1.6 mg/dL — ABNORMAL LOW (ref 1.7–2.4)

## 2019-07-26 LAB — LIPASE, BLOOD: Lipase: 29 U/L (ref 11–51)

## 2019-07-26 MED ORDER — MAGNESIUM SULFATE IN D5W 1-5 GM/100ML-% IV SOLN
1.0000 g | Freq: Once | INTRAVENOUS | Status: AC
  Administered 2019-07-27: 1 g via INTRAVENOUS
  Filled 2019-07-26: qty 100

## 2019-07-26 MED ORDER — SODIUM CHLORIDE 0.9 % IV SOLN: 1.0000 g | Freq: Once | INTRAVENOUS | Status: DC

## 2019-07-26 MED ORDER — SODIUM CHLORIDE 0.9 % IV BOLUS
1000.0000 mL | Freq: Once | INTRAVENOUS | Status: AC
  Administered 2019-07-27: 1000 mL via INTRAVENOUS

## 2019-07-26 MED ORDER — POTASSIUM CHLORIDE 10 MEQ/100ML IV SOLN
10.0000 meq | INTRAVENOUS | Status: AC
  Filled 2019-07-26: qty 100

## 2019-07-26 MED ORDER — IOHEXOL 300 MG/ML  SOLN
100.0000 mL | Freq: Once | INTRAMUSCULAR | Status: AC | PRN
  Administered 2019-07-26: 100 mL via INTRAVENOUS

## 2019-07-26 MED ORDER — ONDANSETRON HCL 4 MG/2ML IJ SOLN
4.0000 mg | Freq: Once | INTRAMUSCULAR | Status: DC
  Filled 2019-07-26: qty 2

## 2019-07-26 MED ORDER — SODIUM CHLORIDE (PF) 0.9 % IJ SOLN
INTRAMUSCULAR | Status: AC
  Administered 2019-07-26: 23:00:00
  Filled 2019-07-26: qty 50

## 2019-07-26 NOTE — ED Provider Notes (Addendum)
[facility] DEPT Provider Note   CSN: [id number] Arrival date & time: 07/26/19  1936     History   Chief Complaint Chief Complaint  Patient presents with  . Abdominal Pain    HPI [name] is a 73 y.o. female.     The history is provided by the patient and medical records. No language interpreter was used.  Abdominal Pain Associated symptoms: diarrhea, nausea and vomiting   Associated symptoms: no fever    [name] is a 73 y.o. female  with a PMH as listed below who presents to the Emergency Department complaining of persistent nausea, vomiting and diarrhea for about 3 weeks now.  She reports dark black loose stools occurring about 2-4 times a day over this timeframe.  Generalized abdominal pain as well.  Daughter at bedside states that the only thing she really keeps down is rice or chicken broth.  She has tried to advance her diet, but she will throw up shortly after.  She is concerned about the timeframe of the symptoms especially considering her markedly decreased appetite with her history of diabetes.  No known fever.  She was seen by her urologist about a month ago and started on antibiotic for presumed urinary tract infection.  She states that she took 1 dose of the antibiotic and then they called her to inform her the culture was okay and she could stop medication.  This is her only recent antibiotic use.  No known sick contacts.  She does have an appointment with her doctor on Friday.  Past Medical History:  Diagnosis Date  . Arthritis   . Bilateral lower extremity edema   . Flank pain   . Full dentures   . History of cervical cancer    11/ 1994  Stage IB  s/p  high dose radiation brachytherapy @ [facility] 01/ 1995--- per pt no recurrence  . History of chronic bronchitis   . History of hyperthyroidism    d 03/ 2011---due to grave's disease--- s/p  RAI treatment 04/ 2011  . History of sepsis 05/03/2018   due to UTI with klebsiella/  pyelonephritis/ ureteral obstruction cause by stone  . Hyperlipidemia   . Hypothyroidism, postradioiodine therapy    endocrinologist--  dr Loanne Drilling--  dx graves disease and s/p RAI i131 treatement 4/ 2011  . Nauseated   . Oral thrush 05/03/2018  . Pneumonia   . Type 2 diabetes mellitus treated with insulin ([place])    FOLLOWED BY PCP  . Urgency of urination   . Wears glasses     Patient Active Problem List   Diagnosis Date Noted  . Nausea, vomiting, and diarrhea 07/27/2019  . Hypokalemia 07/27/2019  . Hypomagnesemia 07/27/2019  . History of cervical cancer 03/18/2019  . Sepsis ([place]) 05/04/2018  . Acute lower UTI 05/04/2018  . Uncontrolled type 2 diabetes mellitus with hyperglycemia ([place]) 05/04/2018  . ARF (acute renal failure) ([place]) 05/04/2018  . Essential hypertension 05/04/2018  . Bilateral lower extremity edema 07/31/2017  . MUSCLE CRAMPS 09/19/2010  . NUMBNESS 09/19/2010  . Hypothyroidism 06/06/2010  . THRUSH 11/29/2008  . Type 2 diabetes mellitus ([place]) 10/20/2008  . Hyperlipidemia associated with type 2 diabetes mellitus ([place]) 10/20/2008  . BRONCHITIS, ACUTE WITH BRONCHOSPASM 10/20/2008  . EMPHYSEMA 10/20/2008  . C O P D 10/20/2008  . CROHN'S DISEASE 10/20/2008  . TOBACCO ABUSE-HISTORY OF 10/20/2008    Past Surgical History:  Procedure Laterality Date  . APPENDECTOMY    .  CATARACT EXTRACTION W/ INTRAOCULAR LENS  IMPLANT, BILATERAL  2004  approx.  . CYSTOSCOPY WITH STENT PLACEMENT Right 05/04/2018   Procedure: CYSTOSCOPY WITH STENT PLACEMENT AND RETROGRADE PYELOGRAM;  Surgeon: Ceasar Mons, MD;  Location: [facility];  Service: Urology;  Laterality: Right;  . CYSTOSCOPY/URETEROSCOPY/HOLMIUM LASER/STENT PLACEMENT Right 05/19/2018   Procedure: CYSTOSCOPY, URETEROSCOPY/HOLMIUM LASER, STONE BASKETRY/ STENT EXCHANGE;  Surgeon: Ceasar Mons, MD;  Location: [facility];  Service: Urology;  Laterality: Right;  . EXCISION MASS LEFT CHEST WALL   09-20-2007   dr Grandville Silos  [facility]  . Radioactive Iodine Therapy     for thyroid  . REVISION TOTAL HIP ARTHROPLASTY Left early 2000s  . TANDEM RING INSERTION  1995   dr Aldean Ast @ [facility]   EUA w/ tandem placement in ovid  (for direct high dose radiation brachytherapy , cervical cancer)  . TOTAL HIP ARTHROPLASTY Left 1990s  . TRANSTHORACIC ECHOCARDIOGRAM  08/07/2017   mild focal basal hypertrophy of the septum,  ef [id number], grade 1 diastolic dysfunction/  atrial septum with lipomatous hypertrophy/  trivial PR     OB History   No obstetric history on file.      Home Medications    Prior to Admission medications   Medication Sig Start Date End Date Taking? Authorizing Provider  alendronate (FOSAMAX) 10 MG tablet Take 10 mg by mouth every 7 (seven) days.    Yes [provider]  ergocalciferol (VITAMIN D2) 50000 UNITS capsule Take 50,000 Units by mouth once a week. Sundays   Yes [provider]  gabapentin (NEURONTIN) 100 MG capsule Take 300 mg by mouth 2 (two) times daily.    Yes [provider]  gemfibrozil (LOPID) 600 MG tablet Take 600 mg by mouth 2 (two) times daily.     Yes [provider]  insulin aspart (NOVOLOG) 100 UNIT/ML injection Inject 0-8 Units into the skin 3 (three) times daily before meals. Sliding scale   Yes [provider]  insulin degludec (TRESIBA FLEXTOUCH) 100 UNIT/ML SOPN FlexTouch Pen Inject 40 Units into the skin at bedtime.    Yes [provider]  levothyroxine (SYNTHROID) 125 MCG tablet Take 125 mcg by mouth daily before breakfast.  03/01/19  Yes [provider]  metFORMIN (GLUCOPHAGE) 500 MG tablet Take 500 mg by mouth 2 (two) times daily with a meal.   Yes [provider]  methocarbamol (ROBAXIN) 500 MG tablet Take 500 mg by mouth 2 (two) times daily as needed for muscle spasms.  04/22/19  Yes [provider]  oxyCODONE ER (XTAMPZA ER) 9 MG C12A Take 9 mg by mouth 2 (two) times daily.    Yes [provider]  nitrofurantoin, macrocrystal-monohydrate, (MACROBID) 100 MG capsule Take 1 capsule (100 mg total) by mouth 2 (two) times daily. Patient not taking: Reported on 07/26/2019 07/08/19   Everitt Amber, MD    Family History Family History  Problem Relation Age of Onset  . Emphysema Father   . Diabetes Father   . Emphysema Sister   . Emphysema Brother   . Cancer Mother        Skin Cancer  . Diabetes Mother     Social History Social History   Tobacco Use  . Smoking status: Former Smoker    Years: 5.00    Types: Cigarettes    Quit date: 05/13/1982    Years since quitting: 37.2  . Smokeless tobacco: Never Used  Substance Use Topics  . Alcohol use: No  .  Drug use: No     Allergies   Codeine   Review of Systems Review of Systems  Constitutional: Negative for fever.  Gastrointestinal: Positive for abdominal pain, diarrhea, nausea and vomiting.  All other systems reviewed and are negative.    Physical Exam Updated Vital Signs BP (!) 160/77 (BP Location: Right Arm)   Pulse 85   Temp 98.6 F (37 C) (Rectal)   Resp (!) 22   Ht 5\' 1"  (1.549 m)   Wt 72.6 kg   SpO2 100%   BMI 30.23 kg/m   Physical Exam Vitals signs and nursing note reviewed.  Constitutional:      General: She is not in acute distress.    Appearance: She is well-developed.  HENT:     Head: Normocephalic and atraumatic.  Neck:     Musculoskeletal: Neck supple.  Cardiovascular:     Rate and Rhythm: Normal rate and regular rhythm.     Heart sounds: Normal heart sounds. No murmur.  Pulmonary:     Effort: Pulmonary effort is normal. No respiratory distress.     Breath sounds: Normal breath sounds.  Abdominal:     General: There is no distension.     Palpations: Abdomen is soft.     Comments: Generalized abdominal tenderness without rebound or guarding.  No flank or CVA tenderness.  Skin:    General: Skin is warm and dry.  Neurological:     Mental Status: She is alert and  oriented to person, place, and time.      ED Treatments / Results  Labs (all labs ordered are listed, but only abnormal results are displayed) Labs Reviewed  COMPREHENSIVE METABOLIC PANEL - Abnormal; Notable for the following components:      Result Value   Potassium 2.5 (*)    Glucose, Bld 150 (*)    Albumin 3.3 (*)    Alkaline Phosphatase 155 (*)    All other components within normal limits  URINALYSIS, ROUTINE W REFLEX MICROSCOPIC - Abnormal; Notable for the following components:   Color, Urine AMBER (*)    APPearance HAZY (*)    Ketones, ur 20 (*)    Protein, ur 30 (*)    Leukocytes,Ua MODERATE (*)    All other components within normal limits  MAGNESIUM - Abnormal; Notable for the following components:   Magnesium 1.6 (*)    All other components within normal limits  URINE CULTURE  C DIFFICILE QUICK SCREEN W PCR REFLEX  GASTROINTESTINAL PANEL BY PCR, STOOL (REPLACES STOOL CULTURE)  SARS CORONAVIRUS 2 (TAT 6-24 HRS)  LIPASE, BLOOD  CBC WITH DIFFERENTIAL/PLATELET  TSH  HEMOGLOBIN A1C  POC OCCULT BLOOD, ED    EKG None  Radiology Ct Abdomen Pelvis W Contrast  Result Date: 07/26/2019 CLINICAL DATA:  73 year old female with abdominal pain, nausea vomiting. Diarrhea for 3 weeks. EXAM: CT ABDOMEN AND PELVIS WITH CONTRAST TECHNIQUE: Multidetector CT imaging of the abdomen and pelvis was performed using the standard protocol following bolus administration of intravenous contrast. CONTRAST:  112mL OMNIPAQUE IOHEXOL 300 MG/ML  SOLN COMPARISON:  CT of the abdomen pelvis dated 03/04/2019 and pelvic MRI dated 06/29/2019 FINDINGS: Lower chest: The visualized lung bases are clear. No intra-abdominal free air or free fluid. Hepatobiliary: No focal liver abnormality is seen. No gallstones, gallbladder wall thickening, or biliary dilatation. Pancreas: Unremarkable. No pancreatic ductal dilatation or surrounding inflammatory changes. Spleen: Splenomegaly measuring 16 cm in AP length.  Adrenals/Urinary Tract: The adrenal glands are unremarkable. Moderate right renal atrophy and  cortical irregularity and scarring. The left kidney is unremarkable. There is no hydronephrosis on either side. There is symmetric enhancement and excretion of contrast by both kidneys. The visualized ureters appear unremarkable. The urinary bladder is grossly unremarkable as visualized. Stomach/Bowel: There is moderate stool throughout the colon. There is no bowel obstruction or active inflammation. A 13 mm fat attenuating lesion in the ascending colon (series 2, image 38 and coronal series 5 image 75) most consistent with a lipoma. The appendix is not visualized with certainty. No inflammatory changes identified in the right lower quadrant. Vascular/Lymphatic: Advanced aortoiliac atherosclerotic disease. The IVC is unremarkable. No portal venous gas. There is no adenopathy. Reproductive: Complex cystic structure superior to the bladder as seen on the prior MRI. This is not well evaluated on this CT but characterized as hematometra on the prior MRI. Other: None Musculoskeletal: There is degenerative changes of the spine. Total left hip arthroplasty with associated streak artifact limiting evaluation of the pelvic structures. No acute osseous pathology. IMPRESSION: 1. No acute intra-abdominal or pelvic pathology. 2. Moderate colonic stool burden. No bowel obstruction or active inflammation. 3. Splenomegaly. 4. Cystic structure within the pelvis corresponding to the hematometra described prior MRI. 5. Aortic Atherosclerosis (ICD10-I70.0). Electronically Signed   By: Anner Crete M.D.   On: 07/26/2019 23:41    Procedures Procedures (including critical care time)  CRITICAL CARE Performed by: Ozella Almond Chyane Greer   Total critical care time: 35 minutes  Critical care time was exclusive of separately billable procedures and treating other patients.  Critical care was necessary to treat or prevent imminent or  life-threatening deterioration.  Critical care was time spent personally by me on the following activities: development of treatment plan with patient and/or surrogate as well as nursing, discussions with consultants, evaluation of patient's response to treatment, examination of patient, obtaining history from patient or surrogate, ordering and performing treatments and interventions, ordering and review of laboratory studies, ordering and review of radiographic studies, pulse oximetry and re-evaluation of patient's condition.  Medications Ordered in ED Medications  potassium chloride 10 mEq in 100 mL IVPB (has no administration in time range)  magnesium sulfate IVPB 1 g 100 mL (has no administration in time range)  sodium chloride 0.9 % bolus 1,000 mL (has no administration in time range)  ondansetron (ZOFRAN) injection 4 mg (has no administration in time range)  sodium chloride (PF) 0.9 % injection (  Given by Other 07/26/19 2318)  iohexol (OMNIPAQUE) 300 MG/ML solution 100 mL (100 mLs Intravenous Contrast Given 07/26/19 2318)     Initial Impression / Assessment and Plan / ED Course  I have reviewed the triage vital signs and the nursing notes.  Pertinent labs & imaging results that were available during my care of the patient were reviewed by me and considered in my medical decision making (see chart for details).       SHERONE PEDIGO is a 73 y.o. female who presents to ED for persistent nausea, vomiting and diarrhea for the last 3 weeks.  On exam, patient is afebrile, hemodynamically stable with generalized abdominal tenderness.  Hemoccult negative.  GI PCR and C. difficile ordered.  Labs reviewed and notable for potassium of 2.5 and magnesium of 1.6.  Both [place] replacement ordered.  CT shows no acute abnormalities.  UA shows moderate leuks, 21-50 WBCs with no bacteria.  Sent for culture.  Consulted hospitalist who will admit.  Agreed to hold off on antibiotics for urine at this time.   Final  Clinical Impressions(s) / ED Diagnoses   Final diagnoses:  Nausea vomiting and diarrhea  Hypokalemia  Hypomagnesemia    ED Discharge Orders    None       Kiev Labrosse, Ozella Almond, PA-C 07/27/19 0024    Veryl Speak, MD 07/29/19 2304    Renner Sebald, Ozella Almond, PA-C 08/12/19 1417    Veryl Speak, MD 08/13/19 [phone]

## 2019-07-26 NOTE — ED Notes (Signed)
Pt has an appointment with Dr Buford Dresser Friday

## 2019-07-26 NOTE — ED Triage Notes (Signed)
Pt complains of abdominal pain with vomiting and diarrhea for three weeks, has been seen at her primary and was tested for UTI/kidney infection, negative at the time, this week the family states that she's been disoriented  Family states that she vomits everything she eats, pt still has her gallbladder

## 2019-07-27 ENCOUNTER — Ambulatory Visit: Payer: Medicare HMO | Admitting: Physician Assistant

## 2019-07-27 DIAGNOSIS — E876 Hypokalemia: Principal | ICD-10-CM

## 2019-07-27 DIAGNOSIS — R112 Nausea with vomiting, unspecified: Secondary | ICD-10-CM | POA: Diagnosis not present

## 2019-07-27 DIAGNOSIS — R197 Diarrhea, unspecified: Secondary | ICD-10-CM

## 2019-07-27 LAB — URINE CULTURE

## 2019-07-27 LAB — GLUCOSE, CAPILLARY
Glucose-Capillary: 118 mg/dL — ABNORMAL HIGH (ref 70–99)
Glucose-Capillary: 112 mg/dL — ABNORMAL HIGH (ref 70–99)

## 2019-07-27 LAB — GASTROINTESTINAL PANEL BY PCR, STOOL (REPLACES STOOL CULTURE)

## 2019-07-27 LAB — MAGNESIUM: Magnesium: 1.9 mg/dL (ref 1.7–2.4)

## 2019-07-27 LAB — CBC
HCT: 33.6 % — ABNORMAL LOW (ref 36.0–46.0)
Hemoglobin: 10.7 g/dL — ABNORMAL LOW (ref 12.0–15.0)
MCH: 28.5 pg (ref 26.0–34.0)
MCHC: 31.8 g/dL (ref 30.0–36.0)
MCV: 89.4 fL (ref 80.0–100.0)
Platelets: 285 10*3/uL (ref 150–400)
RBC: 3.76 MIL/uL — ABNORMAL LOW (ref 3.87–5.11)
RDW: 14.2 % (ref 11.5–15.5)
WBC: 8 10*3/uL (ref 4.0–10.5)
nRBC: 0 % (ref 0.0–0.2)

## 2019-07-27 LAB — BASIC METABOLIC PANEL
Anion gap: 11 (ref 5–15)
BUN: 11 mg/dL (ref 8–23)
CO2: 24 mmol/L (ref 22–32)
Calcium: 8 mg/dL — ABNORMAL LOW (ref 8.9–10.3)
Chloride: 105 mmol/L (ref 98–111)
Creatinine, Ser: 0.85 mg/dL (ref 0.44–1.00)
GFR calc Af Amer: >60 mL/min (ref >60)
GFR calc non Af Amer: >60 mL/min (ref >60)
Glucose, Bld: 129 mg/dL — ABNORMAL HIGH (ref 70–99)
Potassium: 4 mmol/L (ref 3.5–5.1)
Sodium: 140 mmol/L (ref 135–145)

## 2019-07-27 LAB — C DIFFICILE QUICK SCREEN W PCR REFLEX
C Diff antigen: NEGATIVE
C Diff interpretation: NOT DETECTED
C Diff toxin: NEGATIVE

## 2019-07-27 LAB — SARS CORONAVIRUS 2 (TAT 6-24 HRS): SARS Coronavirus 2: NEGATIVE

## 2019-07-27 LAB — TSH: TSH: 0.097 u[IU]/mL — ABNORMAL LOW (ref 0.350–4.500)

## 2019-07-27 MED ORDER — GABAPENTIN 300 MG PO CAPS
300.0000 mg | ORAL_CAPSULE | Freq: Two times a day (BID) | ORAL | Status: DC
  Administered 2019-07-27 (×2): 300 mg via ORAL
  Filled 2019-07-27 (×2): qty 1

## 2019-07-27 MED ORDER — ENOXAPARIN SODIUM 40 MG/0.4ML ~~LOC~~ SOLN
40.0000 mg | Freq: Every day | SUBCUTANEOUS | Status: DC
  Administered 2019-07-27: 40 mg via SUBCUTANEOUS
  Filled 2019-07-27: qty 0.4

## 2019-07-27 MED ORDER — POLYETHYLENE GLYCOL 3350 17 G PO PACK: 17.0000 g | PACK | Freq: Every day | ORAL | 0 refills | Status: DC | PRN

## 2019-07-27 MED ORDER — POTASSIUM CHLORIDE 20 MEQ/15ML (10%) PO SOLN
40.0000 meq | Freq: Once | ORAL | Status: AC
  Administered 2019-07-27: 40 meq via ORAL
  Filled 2019-07-27: qty 30

## 2019-07-27 MED ORDER — POTASSIUM CHLORIDE 10 MEQ/100ML IV SOLN
10.0000 meq | INTRAVENOUS | Status: AC
  Administered 2019-07-27 (×5): 10 meq via INTRAVENOUS
  Filled 2019-07-27 (×4): qty 100

## 2019-07-27 MED ORDER — ONDANSETRON HCL 4 MG/2ML IJ SOLN
4.0000 mg | Freq: Four times a day (QID) | INTRAMUSCULAR | Status: DC | PRN
  Administered 2019-07-27: 4 mg via INTRAVENOUS

## 2019-07-27 MED ORDER — DOCUSATE SODIUM 100 MG PO CAPS: 100.0000 mg | ORAL_CAPSULE | Freq: Two times a day (BID) | ORAL | 0 refills | Status: AC

## 2019-07-27 MED ORDER — POTASSIUM CHLORIDE IN NACL 20-0.9 MEQ/L-% IV SOLN
INTRAVENOUS | Status: AC
  Administered 2019-07-27: 02:00:00 via INTRAVENOUS
  Filled 2019-07-27: qty 1000

## 2019-07-27 MED ORDER — INSULIN ASPART 100 UNIT/ML ~~LOC~~ SOLN
0.0000 [IU] | Freq: Three times a day (TID) | SUBCUTANEOUS | Status: DC
  Filled 2019-07-27: qty 0.09

## 2019-07-27 MED ORDER — LEVOTHYROXINE SODIUM 125 MCG PO TABS: 125.0000 ug | ORAL_TABLET | Freq: Every day | ORAL | Status: DC

## 2019-07-27 MED ORDER — SODIUM CHLORIDE 0.9% FLUSH
3.0000 mL | Freq: Two times a day (BID) | INTRAVENOUS | Status: DC
  Administered 2019-07-27 (×2): 3 mL via INTRAVENOUS

## 2019-07-27 MED ORDER — LEVOTHYROXINE SODIUM 25 MCG PO TABS
125.0000 ug | ORAL_TABLET | Freq: Every day | ORAL | Status: DC
  Administered 2019-07-27: 125 ug via ORAL
  Filled 2019-07-27: qty 1

## 2019-07-27 MED ORDER — INFLUENZA VAC A&B SA ADJ QUAD 0.5 ML IM PRSY: 0.5000 mL | PREFILLED_SYRINGE | INTRAMUSCULAR | Status: DC

## 2019-07-27 MED ORDER — ACETAMINOPHEN 650 MG RE SUPP: 650.0000 mg | Freq: Four times a day (QID) | RECTAL | Status: DC | PRN

## 2019-07-27 MED ORDER — INSULIN GLARGINE 100 UNIT/ML ~~LOC~~ SOLN
20.0000 [IU] | Freq: Every day | SUBCUTANEOUS | Status: DC
  Administered 2019-07-27: 20 [IU] via SUBCUTANEOUS
  Filled 2019-07-27 (×2): qty 0.2

## 2019-07-27 MED ORDER — DULCOLAX 5 MG PO TBEC: 5.0000 mg | DELAYED_RELEASE_TABLET | Freq: Every day | ORAL | 1 refills | Status: DC | PRN

## 2019-07-27 MED ORDER — ACETAMINOPHEN 325 MG PO TABS: 650.0000 mg | ORAL_TABLET | Freq: Four times a day (QID) | ORAL | Status: DC | PRN

## 2019-07-27 MED ORDER — ONDANSETRON HCL 4 MG PO TABS: 4.0000 mg | ORAL_TABLET | Freq: Four times a day (QID) | ORAL | Status: DC | PRN

## 2019-07-27 NOTE — ED Notes (Signed)
Patient ambulated to restroom with minimal assistance to attempt to provide a stool sample.

## 2019-07-27 NOTE — Discharge Summary (Signed)
Physician Discharge Summary  NHUNG DANKO EHU:[id number] DOB: 05/31/1946 DOA: 07/26/2019  PCP: Everardo Beals, NP  Admit date: 07/26/2019 Discharge date: 07/27/2019  Admitted From: Home Disposition:  Home  Recommendations for Outpatient Follow-up:  1. Follow up with PCP in 1-2 weeks 2. Follow up with GI as scheduled  Discharge Condition:Improved CODE STATUS:Full Diet recommendation: Diabetic   Brief/Interim Summary: 73 y.o. female with medical history significant for insulin-dependent type 2 diabetes, hypothyroidism, hyperlipidemia who presents to the ED for evaluation of nausea, vomiting, and diarrhea.  Patient reports 3 weeks of intermittent nausea, vomiting, and diarrhea.  She reports frequent emesis after eating most foods but is able to tolerate clear liquids, broth, chicken and rice, and fries.  She has difficulty keeping down more solid foods such as cheeseburgers.  She is unsure if her symptoms are consistent after dairy products.  She is also been having loose watery stools 4-5 times per day with less episode prior to arrival in the ED.  She reports taking Pepto-Bismol and has noticed some dark-colored stool.  She has not seen any bright red blood per rectum.  She had some abdominal pain a few days ago which has resolved.  She denies any subjective fevers, chills, diaphoresis, chest pain, dyspnea, dysuria, or urinary frequency.  Discharge Diagnoses:  Principal Problem:   Nausea, vomiting, and diarrhea Active Problems:   Hypothyroidism   Type 2 diabetes mellitus (HCC)   Hyperlipidemia associated with type 2 diabetes mellitus (HCC)   Hypokalemia   Hypomagnesemia  Nausea/vomiting/diarrhea: -CT abd/pelvis personally reviewed. Finding of stool through most of the colon, primarily in ascending and transverse colon. -Pt now tolerated advanced diet -On further questioning, pt noted to have "loose stool" leading up to visit. Pt was taking imodium as a result, in addition to  scheduled oxycodone -COVID neg. Stool culture neg. CDiff neg -Suspect presenting abd symptoms related to constipation on CT. Pt did move bowels this AM, passing flatus, and is tolerating diet. Will prescribe PRN miralax, dulcolax, scheduled colace -Recommend close outpatient follow up with GI  Hypokalemia/hypomagnesemia: Replaced  Insulin-dependent type 2 diabetes: Resume home meds on d/c  Hypothyroidism: Continue home Synthroid  Hyperlipidemia: Resume home meds as tolerated  Discharge Instructions   Allergies as of 07/27/2019      Reactions   Codeine Nausea Only      Medication List    STOP taking these medications   nitrofurantoin (macrocrystal-monohydrate) 100 MG capsule Commonly known as: Macrobid     TAKE these medications   alendronate 10 MG tablet Commonly known as: FOSAMAX Take 10 mg by mouth every 7 (seven) days.   docusate sodium 100 MG capsule Commonly known as: Colace Take 1 capsule (100 mg total) by mouth 2 (two) times daily.   Dulcolax 5 MG EC tablet Generic drug: bisacodyl Take 1 tablet (5 mg total) by mouth daily as needed for moderate constipation.   ergocalciferol 1.25 MG (50000 UT) capsule Commonly known as: VITAMIN D2 Take 50,000 Units by mouth once a week. Sundays   gabapentin 100 MG capsule Commonly known as: NEURONTIN Take 300 mg by mouth 2 (two) times daily.   gemfibrozil 600 MG tablet Commonly known as: LOPID Take 600 mg by mouth 2 (two) times daily.   insulin aspart 100 UNIT/ML injection Commonly known as: novoLOG Inject 0-8 Units into the skin 3 (three) times daily before meals. Sliding scale   levothyroxine 125 MCG tablet Commonly known as: SYNTHROID Take 125 mcg by mouth daily before breakfast.  metFORMIN 500 MG tablet Commonly known as: GLUCOPHAGE Take 500 mg by mouth 2 (two) times daily with a meal.   methocarbamol 500 MG tablet Commonly known as: ROBAXIN Take 500 mg by mouth 2 (two) times daily as needed for  muscle spasms.   polyethylene glycol 17 g packet Commonly known as: MiraLax Take 17 g by mouth daily as needed for moderate constipation.   Tyler Aas FlexTouch 100 UNIT/ML Sopn FlexTouch Pen Generic drug: insulin degludec Inject 40 Units into the skin at bedtime.   Xtampza ER 9 MG C12a Generic drug: oxyCODONE ER Take 9 mg by mouth 2 (two) times daily.       Allergies  Allergen Reactions  . Codeine Nausea Only    Procedures/Studies: Mr Pelvis W Wo Contrast  Result Date: 06/29/2019 CLINICAL DATA:  History of cervical cancer status post chemo radiation, pelvic mass on CT EXAM: MRI PELVIS WITHOUT AND WITH CONTRAST TECHNIQUE: Multiplanar multisequence MR imaging of the pelvis was performed both before and after administration of intravenous contrast. CONTRAST:  8 mL Gadovist IV COMPARISON:  Pelvic ultrasound dated 06/20/2019 and 03/23/2019. CT abdomen/pelvis dated 03/04/2019 and 05/03/2018. FINDINGS: Urinary Tract:  Bladder is within normal limits. Bowel:  Visualized bowel is unremarkable. Vascular/Lymphatic: No evidence of aneurysm. No suspicious pelvic lymphadenopathy. Reproductive: Uterus is notable for an endometrial cavity which is distended with fluid/debris (series 14/image 15), accounting for the masslike appearance on CT. Intrinsic hyperintensity on precontrast T1 imaging (series 11/image 19), reflecting hemorrhage. No convincing enhancement on postcontrast subtraction imaging. As such, this appearance is compatible with hematometra, likely secondary to acquired cervical stenosis in this patient with prior cervical radiation. Bilateral ovaries are not discretely visualized. No adnexal mass is seen. Other:  No pelvic ascites. Musculoskeletal: Susceptibility artifact related to ORIF of the left hip. Mild degenerative changes of the lumbar spine. IMPRESSION: Hematometra, likely secondary to acquired cervical stenosis in this patient with prior cervical radiation, accounting for the masslike  appearance on CT. Electronically Signed   By: Julian Hy M.D.   On: 06/29/2019 20:41   Ct Abdomen Pelvis W Contrast  Result Date: 07/26/2019 CLINICAL DATA:  73 year old female with abdominal pain, nausea vomiting. Diarrhea for 3 weeks. EXAM: CT ABDOMEN AND PELVIS WITH CONTRAST TECHNIQUE: Multidetector CT imaging of the abdomen and pelvis was performed using the standard protocol following bolus administration of intravenous contrast. CONTRAST:  125m OMNIPAQUE IOHEXOL 300 MG/ML  SOLN COMPARISON:  CT of the abdomen pelvis dated 03/04/2019 and pelvic MRI dated 06/29/2019 FINDINGS: Lower chest: The visualized lung bases are clear. No intra-abdominal free air or free fluid. Hepatobiliary: No focal liver abnormality is seen. No gallstones, gallbladder wall thickening, or biliary dilatation. Pancreas: Unremarkable. No pancreatic ductal dilatation or surrounding inflammatory changes. Spleen: Splenomegaly measuring 16 cm in AP length. Adrenals/Urinary Tract: The adrenal glands are unremarkable. Moderate right renal atrophy and cortical irregularity and scarring. The left kidney is unremarkable. There is no hydronephrosis on either side. There is symmetric enhancement and excretion of contrast by both kidneys. The visualized ureters appear unremarkable. The urinary bladder is grossly unremarkable as visualized. Stomach/Bowel: There is moderate stool throughout the colon. There is no bowel obstruction or active inflammation. A 13 mm fat attenuating lesion in the ascending colon (series 2, image 38 and coronal series 5 image 75) most consistent with a lipoma. The appendix is not visualized with certainty. No inflammatory changes identified in the right lower quadrant. Vascular/Lymphatic: Advanced aortoiliac atherosclerotic disease. The IVC is unremarkable. No portal venous gas. There  is no adenopathy. Reproductive: Complex cystic structure superior to the bladder as seen on the prior MRI. This is not well evaluated  on this CT but characterized as hematometra on the prior MRI. Other: None Musculoskeletal: There is degenerative changes of the spine. Total left hip arthroplasty with associated streak artifact limiting evaluation of the pelvic structures. No acute osseous pathology. IMPRESSION: 1. No acute intra-abdominal or pelvic pathology. 2. Moderate colonic stool burden. No bowel obstruction or active inflammation. 3. Splenomegaly. 4. Cystic structure within the pelvis corresponding to the hematometra described prior MRI. 5. Aortic Atherosclerosis (ICD10-I70.0). Electronically Signed   By: Anner Crete M.D.   On: 07/26/2019 23:41     Subjective: Feeling better. Tolerating diet this afternoon  Discharge Exam: Vitals:   07/27/19 0603 07/27/19 1309  BP: (!) 118/46 (!) 145/50  Pulse: 67 (!) 57  Resp: 18 19  Temp: 98.4 F (36.9 C) 97.8 F (36.6 C)  SpO2: 100% 99%   Vitals:   07/27/19 0516 07/27/19 0552 07/27/19 0603 07/27/19 1309  BP: (!) 141/47  (!) 118/46 (!) 145/50  Pulse: 66  67 (!) 57  Resp: 20  18 19   Temp:   98.4 F (36.9 C) 97.8 F (36.6 C)  TempSrc:   Oral Oral  SpO2: 96%  100% 99%  Weight:  74 kg    Height:  5' (1.524 m)      General: Pt is alert, awake, not in acute distress Cardiovascular: RRR, S1/S2 +, no rubs, no gallops Respiratory: CTA bilaterally, no wheezing, no rhonchi Abdominal: Soft, NT, ND, bowel sounds + Extremities: no edema, no cyanosis   The results of significant diagnostics from this hospitalization (including imaging, microbiology, ancillary and laboratory) are listed below for reference.     Microbiology: Recent Results (from the past 240 hour(s))  SARS CORONAVIRUS 2 (TAT 6-24 HRS) Nasopharyngeal Nasopharyngeal Swab     Status: None   Collection Time: 07/27/19  1:30 AM   Specimen: Nasopharyngeal Swab  Result Value Ref Range Status   SARS Coronavirus 2 NEGATIVE NEGATIVE Final    Comment: (NOTE) SARS-CoV-2 target nucleic acids are NOT DETECTED. The  SARS-CoV-2 RNA is generally detectable in upper and lower respiratory specimens during the acute phase of infection. Negative results do not preclude SARS-CoV-2 infection, do not rule out co-infections with other pathogens, and should not be used as the sole basis for treatment or other patient management decisions. Negative results must be combined with clinical observations, patient history, and epidemiological information. The expected result is Negative. Fact Sheet for Patients: SugarRoll.be Fact Sheet for Healthcare Providers: https://www.woods-mathews.com/ This test is not yet approved or cleared by the [place] FDA and  has been authorized for detection and/or diagnosis of SARS-CoV-2 by FDA under an Emergency Use Authorization (EUA). This EUA will remain  in effect (meaning this test can be used) for the duration of the COVID-19 declaration under Section 56 4(b)(1) of the Act, 21 U.S.C. section 360bbb-3(b)(1), unless the authorization is terminated or revoked sooner. Performed at [facility], [place] [address] [place], [place] [address]   C difficile quick scan w PCR reflex     Status: None   Collection Time: 07/27/19  5:17 AM   Specimen: STOOL  Result Value Ref Range Status   C Diff antigen NEGATIVE NEGATIVE Final   C Diff toxin NEGATIVE NEGATIVE Final   C Diff interpretation No C. difficile detected.  Final    Comment: Performed at [facility], [place]  [address] [place], [place] [address]  Gastrointestinal Panel by PCR , Stool     Status: None   Collection Time: 07/27/19  5:17 AM   Specimen: STOOL  Result Value Ref Range Status   Campylobacter species NOT DETECTED NOT DETECTED Final   Plesimonas shigelloides NOT DETECTED NOT DETECTED Final   Salmonella species NOT DETECTED NOT DETECTED Final   Yersinia enterocolitica NOT DETECTED NOT DETECTED Final   Vibrio species NOT DETECTED NOT DETECTED Final    Vibrio cholerae NOT DETECTED NOT DETECTED Final   Enteroaggregative E coli (EAEC) NOT DETECTED NOT DETECTED Final   Enteropathogenic E coli (EPEC) NOT DETECTED NOT DETECTED Final   Enterotoxigenic E coli (ETEC) NOT DETECTED NOT DETECTED Final   Shiga like toxin producing E coli (STEC) NOT DETECTED NOT DETECTED Final   Shigella/Enteroinvasive E coli (EIEC) NOT DETECTED NOT DETECTED Final   Cryptosporidium NOT DETECTED NOT DETECTED Final   Cyclospora cayetanensis NOT DETECTED NOT DETECTED Final   Entamoeba histolytica NOT DETECTED NOT DETECTED Final   Giardia lamblia NOT DETECTED NOT DETECTED Final   Adenovirus F40/41 NOT DETECTED NOT DETECTED Final   Astrovirus NOT DETECTED NOT DETECTED Final   Norovirus GI/GII NOT DETECTED NOT DETECTED Final   Rotavirus A NOT DETECTED NOT DETECTED Final   Sapovirus (I, II, IV, and V) NOT DETECTED NOT DETECTED Final    Comment: Performed at [facility], [address] [place], [place] [address]     Labs: BNP (last 3 results) No results for input(s): BNP in the last 8760 hours. Basic Metabolic Panel: Recent Labs  Lab 07/26/19 2106 07/26/19 2206 07/27/19 0525  NA 139  --  140  K 2.5*  --  4.0  CL 99  --  105  CO2 27  --  24  GLUCOSE 150*  --  129*  BUN 13  --  11  CREATININE 0.92  --  0.85  CALCIUM 9.0  --  8.0*  MG  --  1.6* 1.9   Liver Function Tests: Recent Labs  Lab 07/26/19 2106  AST 18  ALT 10  ALKPHOS 155*  BILITOT 1.1  PROT 8.0  ALBUMIN 3.3*   Recent Labs  Lab 07/26/19 2106  LIPASE 29   No results for input(s): AMMONIA in the last 168 hours. CBC: Recent Labs  Lab 07/26/19 2106 07/27/19 0525  WBC 7.8 8.0  NEUTROABS 6.0  --   HGB 12.3 10.7*  HCT 39.3 33.6*  MCV 87.5 89.4  PLT 336 285   Cardiac Enzymes: No results for input(s): CKTOTAL, CKMB, CKMBINDEX, TROPONINI in the last 168 hours. BNP: Invalid input(s): POCBNP CBG: Recent Labs  Lab 07/27/19 0811 07/27/19 1303  GLUCAP 118* 112*    D-Dimer No results for input(s): DDIMER in the last 72 hours. Hgb A1c No results for input(s): HGBA1C in the last 72 hours. Lipid Profile No results for input(s): CHOL, HDL, LDLCALC, TRIG, CHOLHDL, LDLDIRECT in the last 72 hours. Thyroid function studies Recent Labs    07/26/19 2205  TSH 0.097*   Anemia work up No results for input(s): VITAMINB12, FOLATE, FERRITIN, TIBC, IRON, RETICCTPCT in the last 72 hours. Urinalysis    Component Value Date/Time   COLORURINE AMBER (A) 07/26/2019 2046   APPEARANCEUR HAZY (A) 07/26/2019 2046   LABSPEC 1.023 07/26/2019 2046   PHURINE 5.0 07/26/2019 2046   GLUCOSEU NEGATIVE 07/26/2019 2046   HGBUR NEGATIVE 07/26/2019 2046   BILIRUBINUR NEGATIVE 07/26/2019 2046   KETONESUR 20 (A) 07/26/2019 2046  PROTEINUR 30 (A) 07/26/2019 2046   UROBILINOGEN 0.2 05/27/2014 1203   NITRITE NEGATIVE 07/26/2019 2046   LEUKOCYTESUR MODERATE (A) 07/26/2019 2046   Sepsis Labs Invalid input(s): PROCALCITONIN,  WBC,  LACTICIDVEN Microbiology Recent Results (from the past 240 hour(s))  SARS CORONAVIRUS 2 (TAT 6-24 HRS) Nasopharyngeal Nasopharyngeal Swab     Status: None   Collection Time: 07/27/19  1:30 AM   Specimen: Nasopharyngeal Swab  Result Value Ref Range Status   SARS Coronavirus 2 NEGATIVE NEGATIVE Final    Comment: (NOTE) SARS-CoV-2 target nucleic acids are NOT DETECTED. The SARS-CoV-2 RNA is generally detectable in upper and lower respiratory specimens during the acute phase of infection. Negative results do not preclude SARS-CoV-2 infection, do not rule out co-infections with other pathogens, and should not be used as the sole basis for treatment or other patient management decisions. Negative results must be combined with clinical observations, patient history, and epidemiological information. The expected result is Negative. Fact Sheet for Patients: SugarRoll.be Fact Sheet for Healthcare  Providers: https://www.woods-mathews.com/ This test is not yet approved or cleared by the [place] FDA and  has been authorized for detection and/or diagnosis of SARS-CoV-2 by FDA under an Emergency Use Authorization (EUA). This EUA will remain  in effect (meaning this test can be used) for the duration of the COVID-19 declaration under Section 56 4(b)(1) of the Act, 21 U.S.C. section 360bbb-3(b)(1), unless the authorization is terminated or revoked sooner. Performed at [facility], [place] [address] [place], [place] [address]   C difficile quick scan w PCR reflex     Status: None   Collection Time: 07/27/19  5:17 AM   Specimen: STOOL  Result Value Ref Range Status   C Diff antigen NEGATIVE NEGATIVE Final   C Diff toxin NEGATIVE NEGATIVE Final   C Diff interpretation No C. difficile detected.  Final    Comment: Performed at [facility], [place] [address], [place], [place] [address]  Gastrointestinal Panel by PCR , Stool     Status: None   Collection Time: 07/27/19  5:17 AM   Specimen: STOOL  Result Value Ref Range Status   Campylobacter species NOT DETECTED NOT DETECTED Final   Plesimonas shigelloides NOT DETECTED NOT DETECTED Final   Salmonella species NOT DETECTED NOT DETECTED Final   Yersinia enterocolitica NOT DETECTED NOT DETECTED Final   Vibrio species NOT DETECTED NOT DETECTED Final   Vibrio cholerae NOT DETECTED NOT DETECTED Final   Enteroaggregative E coli (EAEC) NOT DETECTED NOT DETECTED Final   Enteropathogenic E coli (EPEC) NOT DETECTED NOT DETECTED Final   Enterotoxigenic E coli (ETEC) NOT DETECTED NOT DETECTED Final   Shiga like toxin producing E coli (STEC) NOT DETECTED NOT DETECTED Final   Shigella/Enteroinvasive E coli (EIEC) NOT DETECTED NOT DETECTED Final   Cryptosporidium NOT DETECTED NOT DETECTED Final   Cyclospora cayetanensis NOT DETECTED NOT DETECTED Final   Entamoeba histolytica NOT DETECTED NOT DETECTED Final    Giardia lamblia NOT DETECTED NOT DETECTED Final   Adenovirus F40/41 NOT DETECTED NOT DETECTED Final   Astrovirus NOT DETECTED NOT DETECTED Final   Norovirus GI/GII NOT DETECTED NOT DETECTED Final   Rotavirus A NOT DETECTED NOT DETECTED Final   Sapovirus (I, II, IV, and V) NOT DETECTED NOT DETECTED Final    Comment: Performed at [facility], [address], [place], [place] [address]   Time spent: 57mn  SIGNED:   SMarylu Lund MD  [facility] 07/27/2019, 4:02 PM  If 7PM-7AM, please contact night-coverage

## 2019-07-27 NOTE — ED Notes (Signed)
Patient ambulatory to restroom with walker and minimal assistance.

## 2019-07-27 NOTE — H&P (Signed)
History and Physical    [name] RDE:[id number] DOB: 11-09-1946 DOA: 07/26/2019  PCP: Everardo Beals, NP  Patient coming from: Home  I have personally briefly reviewed patient's old medical records in [place]  Chief Complaint: Nausea, vomiting, diarrhea  HPI: [name] is a 73 y.o. female with medical history significant for insulin-dependent type 2 diabetes, hypothyroidism, hyperlipidemia who presents to the ED for evaluation of nausea, vomiting, and diarrhea.  Patient reports 3 weeks of intermittent nausea, vomiting, and diarrhea.  She reports frequent emesis after eating most foods but is able to tolerate clear liquids, broth, chicken and rice, and fries.  She has difficulty keeping down more solid foods such as cheeseburgers.  She is unsure if her symptoms are consistent after dairy products.  She is also been having loose watery stools 4-5 times per day with less episode prior to arrival in the ED.  She reports taking Pepto-Bismol and has noticed some dark-colored stool.  She has not seen any bright red blood per rectum.  She had some abdominal pain a few days ago which has resolved.  She denies any subjective fevers, chills, diaphoresis, chest pain, dyspnea, dysuria, or urinary frequency.  ED Course:  Initial vitals showed BP 160/77, pulse 85, RR 22, temp 99.1 Fahrenheit, SPO2 100% on room air.  Labs notable for potassium 2.5, magnesium 1.6, sodium 139, chloride 99, bicarb 27, BUN 13, creatinine 0.92, serum glucose 150, AST 18, ALT 10, alk phos 155, total bilirubin 1.1, lipase 29, WBC 7.8, hemoglobin 12.3, platelets 336,000.  FOBT is negative.  Urinalysis showed negative nitrites, moderate leukocytes, 21-WBCs, no bacteria on microscopy.  Urine culture was obtained and pending.  GI pathogen panel and C. difficile PCR were ordered and pending.  SARS-CoV-2 test was ordered and pending.  CT abdomen/pelvis with contrast was negative for acute intra-abdominal or  pelvic pathology.  Moderate colonic stool burden was noted without bowel obstruction or inflammation.  Cystic structure within the pelvis was seen corresponding to the hematometra seen on prior MR pelvis.  Patient is ordered to receive 1 L normal saline, IV magnesium, IV potassium.  The hospitalist service was consulted to admit for further evaluation and management.   Review of Systems: All systems reviewed and are negative except as documented in history of present illness above.   Past Medical History:  Diagnosis Date   Arthritis    Bilateral lower extremity edema    Flank pain    Full dentures    History of cervical cancer    11/ 1994  Stage IB  s/p  high dose radiation brachytherapy @ [facility] 01/ 1995--- per pt no recurrence   History of chronic bronchitis    History of hyperthyroidism    d 03/ 2011---due to grave's disease--- s/p  RAI treatment 04/ 2011   History of sepsis 05/03/2018   due to UTI with klebsiella/ pyelonephritis/ ureteral obstruction cause by stone   Hyperlipidemia    Hypothyroidism, postradioiodine therapy    endocrinologist--  dr Loanne Drilling--  dx graves disease and s/p RAI i131 treatement 4/ 2011   Nauseated    Oral thrush 05/03/2018   Pneumonia    Type 2 diabetes mellitus treated with insulin ([place])    FOLLOWED BY PCP   Urgency of urination    Wears glasses     Past Surgical History:  Procedure Laterality Date   APPENDECTOMY     CATARACT EXTRACTION W/ INTRAOCULAR LENS  IMPLANT, BILATERAL  2004  approx.  CYSTOSCOPY WITH STENT PLACEMENT Right 05/04/2018   Procedure: CYSTOSCOPY WITH STENT PLACEMENT AND RETROGRADE PYELOGRAM;  Surgeon: Ceasar Mons, MD;  Location: [facility];  Service: Urology;  Laterality: Right;   CYSTOSCOPY/URETEROSCOPY/HOLMIUM LASER/STENT PLACEMENT Right 05/19/2018   Procedure: CYSTOSCOPY, URETEROSCOPY/HOLMIUM LASER, STONE BASKETRY/ STENT EXCHANGE;  Surgeon: Ceasar Mons, MD;  [facility];  Service: Urology;  Laterality: Right;   EXCISION MASS LEFT CHEST WALL  09-20-2007   dr Grandville Silos  [facility]   Radioactive Iodine Therapy     for thyroid   REVISION TOTAL HIP ARTHROPLASTY Left early 2000s   TANDEM RING INSERTION  1995   dr Aldean Ast @ [facility]   EUA w/ tandem placement in ovid  (for direct high dose radiation brachytherapy , cervical cancer)   TOTAL HIP ARTHROPLASTY Left 1990s   TRANSTHORACIC ECHOCARDIOGRAM  08/07/2017   mild focal basal hypertrophy of the septum,  ef 51-02%, grade 1 diastolic dysfunction/  atrial septum with lipomatous hypertrophy/  trivial PR    Social History:  reports that she quit smoking about 37 years ago. Her smoking use included cigarettes. She quit after 5.00 years of use. She has never used smokeless tobacco. She reports that she does not drink alcohol or use drugs.  Allergies  Allergen Reactions   Codeine Nausea Only    Family History  Problem Relation Age of Onset   Emphysema Father    Diabetes Father    Emphysema Sister    Emphysema Brother    Cancer Mother        Skin Cancer   Diabetes Mother      Prior to Admission medications   Medication Sig Start Date End Date Taking? Authorizing Provider  alendronate (FOSAMAX) 10 MG tablet Take 10 mg by mouth every 7 (seven) days.    Yes [provider]  ergocalciferol (VITAMIN D2) 50000 UNITS capsule Take 50,000 Units by mouth once a week. Sundays   Yes [provider]  gabapentin (NEURONTIN) 100 MG capsule Take 300 mg by mouth 2 (two) times daily.    Yes [provider]  gemfibrozil (LOPID) 600 MG tablet Take 600 mg by mouth 2 (two) times daily.     Yes [provider]  insulin aspart (NOVOLOG) 100 UNIT/ML injection Inject 0-8 Units into the skin 3 (three) times daily before meals. Sliding scale   Yes [provider]  insulin degludec (TRESIBA FLEXTOUCH) 100 UNIT/ML SOPN FlexTouch Pen Inject 40 Units into the skin at  bedtime.    Yes [provider]  levothyroxine (SYNTHROID) 125 MCG tablet Take 125 mcg by mouth daily before breakfast.  03/01/19  Yes [provider]  metFORMIN (GLUCOPHAGE) 500 MG tablet Take 500 mg by mouth 2 (two) times daily with a meal.   Yes [provider]  methocarbamol (ROBAXIN) 500 MG tablet Take 500 mg by mouth 2 (two) times daily as needed for muscle spasms.  04/22/19  Yes [provider]  PRESCRIPTION MEDICATION Take 1 capsule by mouth every 12 (twelve) hours. A time-released opioid medication   Yes [provider]  nitrofurantoin, macrocrystal-monohydrate, (MACROBID) 100 MG capsule Take 1 capsule (100 mg total) by mouth 2 (two) times daily. Patient not taking: Reported on 07/26/2019 07/08/19   Everitt Amber, MD    Physical Exam: Vitals:   07/26/19 1949 07/26/19 2115 07/27/19 0030  BP: (!) 160/77  (!) 142/71  Pulse: 85  74  Resp: (!) 22  16  Temp: 99.1 F (37.3 C) 98.6  F (37 C)   TempSrc: Oral Rectal   SpO2: 100%  97%  Weight: 72.6 kg    Height: _0  (1.549 m)      Constitutional: Elderly woman resting supine in bed, NAD, calm, comfortable Eyes: PERRL, lids and conjunctivae normal ENMT: Mucous membranes are dry. Posterior pharynx clear of any exudate or lesions.Normal dentition.  Neck: normal, supple, no masses. Respiratory: clear to auscultation bilaterally, no wheezing, no crackles. Normal respiratory effort. No accessory muscle use.  Cardiovascular: Regular rate and rhythm, no murmurs / rubs / gallops. No extremity edema. 2+ pedal pulses. Abdomen: no tenderness, no masses palpated. No hepatosplenomegaly. Bowel sounds positive.  Musculoskeletal: no clubbing / cyanosis. No joint deformity upper and lower extremities. Good ROM, no contractures. Normal muscle tone.  Skin: no rashes, lesions, ulcers. No induration Neurologic: CN 2-12 grossly intact. Sensation intact,Strength 5/5 in all 4.  Psychiatric: Normal judgment and insight.  Alert and oriented x 3. Normal mood.   Labs on Admission: I have personally reviewed following labs and imaging studies  CBC: Recent Labs  Lab 07/26/19 2106  WBC 7.8  NEUTROABS 6.0  HGB 12.3  HCT 39.3  MCV 87.5  PLT 381   Basic Metabolic Panel: Recent Labs  Lab 07/26/19 2106 07/26/19 2206  NA 139  --   K 2.5*  --   CL 99  --   CO2 27  --   GLUCOSE 150*  --   BUN 13  --   CREATININE 0.92  --   CALCIUM 9.0  --   MG  --  1.6*   GFR: Estimated Creatinine Clearance: 49.6 mL/min (by C-G formula based on SCr of 0.92 mg/dL). Liver Function Tests: Recent Labs  Lab 07/26/19 2106  AST 18  ALT 10  ALKPHOS 155*  BILITOT 1.1  PROT 8.0  ALBUMIN 3.3*   Recent Labs  Lab 07/26/19 2106  LIPASE 29   No results for input(s): AMMONIA in the last 168 hours. Coagulation Profile: No results for input(s): INR, PROTIME in the last 168 hours. Cardiac Enzymes: No results for input(s): CKTOTAL, CKMB, CKMBINDEX, TROPONINI in the last 168 hours. BNP (last 3 results) No results for input(s): PROBNP in the last 8760 hours. HbA1C: No results for input(s): HGBA1C in the last 72 hours. CBG: No results for input(s): GLUCAP in the last 168 hours. Lipid Profile: No results for input(s): CHOL, HDL, LDLCALC, TRIG, CHOLHDL, LDLDIRECT in the last 72 hours. Thyroid Function Tests: No results for input(s): TSH, T4TOTAL, FREET4, T3FREE, THYROIDAB in the last 72 hours. Anemia Panel: No results for input(s): VITAMINB12, FOLATE, FERRITIN, TIBC, IRON, RETICCTPCT in the last 72 hours. Urine analysis:    Component Value Date/Time   COLORURINE AMBER (A) 07/26/2019 2046   APPEARANCEUR HAZY (A) 07/26/2019 2046   LABSPEC 1.023 07/26/2019 2046   PHURINE 5.0 07/26/2019 2046   GLUCOSEU NEGATIVE 07/26/2019 2046   HGBUR NEGATIVE 07/26/2019 2046   BILIRUBINUR NEGATIVE 07/26/2019 2046   KETONESUR 20 (A) 07/26/2019 2046   PROTEINUR 30 (A) 07/26/2019 2046   UROBILINOGEN 0.2 05/27/2014 1203   NITRITE  NEGATIVE 07/26/2019 2046   LEUKOCYTESUR MODERATE (A) 07/26/2019 2046    Radiological Exams on Admission: Ct Abdomen Pelvis W Contrast  Result Date: 07/26/2019 CLINICAL DATA:  73 year old female with abdominal pain, nausea vomiting. Diarrhea for 3 weeks. EXAM: CT ABDOMEN AND PELVIS WITH CONTRAST TECHNIQUE: Multidetector CT imaging of the abdomen and pelvis was performed using the standard protocol following bolus administration of intravenous contrast. CONTRAST:  141m OMNIPAQUE IOHEXOL 300 MG/ML  SOLN COMPARISON:  CT of the abdomen pelvis dated 03/04/2019 and pelvic MRI dated 06/29/2019 FINDINGS: Lower chest: The visualized lung bases are clear. No intra-abdominal free air or free fluid. Hepatobiliary: No focal liver abnormality is seen. No gallstones, gallbladder wall thickening, or biliary dilatation. Pancreas: Unremarkable. No pancreatic ductal dilatation or surrounding inflammatory changes. Spleen: Splenomegaly measuring 16 cm in AP length. Adrenals/Urinary Tract: The adrenal glands are unremarkable. Moderate right renal atrophy and cortical irregularity and scarring. The left kidney is unremarkable. There is no hydronephrosis on either side. There is symmetric enhancement and excretion of contrast by both kidneys. The visualized ureters appear unremarkable. The urinary bladder is grossly unremarkable as visualized. Stomach/Bowel: There is moderate stool throughout the colon. There is no bowel obstruction or active inflammation. A 13 mm fat attenuating lesion in the ascending colon (series 2, image 38 and coronal series 5 image 75) most consistent with a lipoma. The appendix is not visualized with certainty. No inflammatory changes identified in the right lower quadrant. Vascular/Lymphatic: Advanced aortoiliac atherosclerotic disease. The IVC is unremarkable. No portal venous gas. There is no adenopathy. Reproductive: Complex cystic structure superior to the bladder as seen on the prior MRI. This is not  well evaluated on this CT but characterized as hematometra on the prior MRI. Other: None Musculoskeletal: There is degenerative changes of the spine. Total left hip arthroplasty with associated streak artifact limiting evaluation of the pelvic structures. No acute osseous pathology. IMPRESSION: 1. No acute intra-abdominal or pelvic pathology. 2. Moderate colonic stool burden. No bowel obstruction or active inflammation. 3. Splenomegaly. 4. Cystic structure within the pelvis corresponding to the hematometra described prior MRI. 5. Aortic Atherosclerosis (ICD10-I70.0). Electronically Signed   By: AAnner CreteM.D.   On: 07/26/2019 23:41    EKG: Independently reviewed. Sinus arrhythmia, low voltage, QTC 492.  Assessment/Plan Principal Problem:   Nausea, vomiting, and diarrhea Active Problems:   Hypothyroidism   [place]   Hyperlipidemia associated with type 2 diabetes mellitus ([facility])   Hypokalemia   Hypomagnesemia  BKRISTEN FROMMis a 73y.o. female with medical history significant for insulin-dependent type 2 diabetes, hypothyroidism, hyperlipidemia who is admitted with nausea, vomiting, and diarrhea.   Nausea/vomiting/diarrhea: Unclear etiology, possibly dietary cause.  CT abdomen/pelvis without acute abnormality.  Patient noted to have a hematometra on CT which is consistent with prior MRI pelvis followed by her gynecologist.  Will continue supportive care with fluids and electrolyte replacement. -Give 1 L normal saline bolus followed by maintenance IV fluids -Antiemetics as needed -GI pathogen panel and C. difficile study pending -SARS-CoV-2 test pending  Hypokalemia/hypomagnesemia: Replete magnesium followed by potassium.  Repeat labs in a.m.  Insulin-dependent type 2 diabetes: Hold home metformin.  Continue reduced home Tresiba 20 units nightly and add sensitive SSI.  Hypothyroidism: Continue home Synthroid, check TSH.  Hyperlipidemia: Hold home  gemfibrozil given GI symptoms.  DVT prophylaxis: Lovenox Code Status: Full code, confirmed with patient Family Communication: Discussed with daughter at bedside Disposition Plan: Pending clinical progress Consults called: None Admission status: Observation   VZada FindersMD [facility]  If 7PM-7AM, please contact night-coverage [email or web address]  07/27/2019, 1:01 AM

## 2019-07-27 NOTE — ED Notes (Signed)
ED TO INPATIENT HANDOFF REPORT  ED Nurse Name and Phone #: Mayer Camel, RN   S Name/Age/Gender [name] 73 y.o. female Room/Bed: WA10/WA10  Code Status   Code Status: Full Code  Home/SNF/Other Home Patient oriented to: self, place, time and situation Is this baseline? Yes   Triage Complete: Triage complete  Chief Complaint abd pain; disoriented  Triage Note Pt complains of abdominal pain with vomiting and diarrhea for three weeks, has been seen at her primary and was tested for UTI/kidney infection, negative at the time, this week the family states that she's been disoriented  Family states that she vomits everything she eats, pt still has her gallbladder   Allergies Allergies  Allergen Reactions  . Codeine Nausea Only    Level of Care/Admitting Diagnosis ED Disposition    ED Disposition Condition [facility] Area: [place] [[id number]]  Level of Care: Telemetry [5]  Admit to tele based on following criteria: Other see comments  Comments: Hypokalemia, hypomagnesemia  Covid Evaluation: Person Under Investigation (PUI)  Diagnosis: Nausea, vomiting, and diarrhea [id number]  Admitting Physician: Lenore Cordia [id number]  Attending Physician: Lenore Cordia [id number]  PT Class (Do Not Modify): Observation [104]  PT Acc Code (Do Not Modify): Observation [10022]       B Medical/Surgery History Past Medical History:  Diagnosis Date  . Arthritis   . Bilateral lower extremity edema   . Flank pain   . Full dentures   . History of cervical cancer    11/ 1994  Stage IB  s/p  high dose radiation brachytherapy @ [facility] 01/ 1995--- per pt no recurrence  . History of chronic bronchitis   . History of hyperthyroidism    d 03/ 2011---due to grave's disease--- s/p  RAI treatment 04/ 2011  . History of sepsis 05/03/2018   due to UTI with klebsiella/ pyelonephritis/ ureteral obstruction cause by stone  . Hyperlipidemia   . Hypothyroidism,  postradioiodine therapy    endocrinologist--  dr Loanne Drilling--  dx graves disease and s/p RAI i131 treatement 4/ 2011  . Nauseated   . Oral thrush 05/03/2018  . Pneumonia   . Type 2 diabetes mellitus treated with insulin ([facility])    FOLLOWED BY PCP  . Urgency of urination   . Wears glasses    Past Surgical History:  Procedure Laterality Date  . APPENDECTOMY    . CATARACT EXTRACTION W/ INTRAOCULAR LENS  IMPLANT, BILATERAL  2004  approx.  . CYSTOSCOPY WITH STENT PLACEMENT Right 05/04/2018   Procedure: CYSTOSCOPY WITH STENT PLACEMENT AND RETROGRADE PYELOGRAM;  Surgeon: Ceasar Mons, MD;  Location: [facility];  Service: Urology;  Laterality: Right;  . CYSTOSCOPY/URETEROSCOPY/HOLMIUM LASER/STENT PLACEMENT Right 05/19/2018   Procedure: CYSTOSCOPY, URETEROSCOPY/HOLMIUM LASER, STONE BASKETRY/ STENT EXCHANGE;  Surgeon: Ceasar Mons, MD;  Location: [facility];  Service: Urology;  Laterality: Right;  . EXCISION MASS LEFT CHEST WALL  09-20-2007   dr Grandville Silos  [facility]  . Radioactive Iodine Therapy     for thyroid  . REVISION TOTAL HIP ARTHROPLASTY Left early 2000s  . TANDEM RING INSERTION  1995   dr Aldean Ast @ [facility]   EUA w/ tandem placement in ovid  (for direct high dose radiation brachytherapy , cervical cancer)  . TOTAL HIP ARTHROPLASTY Left 1990s  . TRANSTHORACIC ECHOCARDIOGRAM  08/07/2017   mild focal basal hypertrophy of the septum,  ef [id number], grade 1 diastolic dysfunction/  atrial septum with lipomatous hypertrophy/  trivial PR     A IV Location/Drains/Wounds Patient Lines/Drains/Airways Status   Active Line/Drains/Airways    Name:   Placement date:   Placement time:   Site:   Days:   Peripheral IV 07/26/19 Right;Lateral Antecubital   07/26/19    2318    Antecubital   1   Ureteral Drain/Stent Right ureter 6 Fr.   05/19/18    1059    Right ureter   434   Incision (Closed) 05/19/18 Perineum Right   05/19/18    0854     434          Intake/Output Last 24  hours  Intake/Output Summary (Last 24 hours) at 07/27/2019 0259 Last data filed at 07/27/2019 0150 Gross per 24 hour  Intake 1100 ml  Output -  Net 1100 ml    Labs/Imaging Results for orders placed or performed during the hospital encounter of 07/26/19 (from the past 48 hour(s))  Urinalysis, Routine w reflex microscopic     Status: Abnormal   Collection Time: 07/26/19  8:46 PM  Result Value Ref Range   Color, Urine AMBER (A) YELLOW    Comment: BIOCHEMICALS MAY BE AFFECTED BY COLOR   APPearance HAZY (A) CLEAR   Specific Gravity, Urine 1.023 1.005 - 1.030   pH 5.0 5.0 - 8.0   Glucose, UA NEGATIVE NEGATIVE mg/dL   Hgb urine dipstick NEGATIVE NEGATIVE   Bilirubin Urine NEGATIVE NEGATIVE   Ketones, ur 20 (A) NEGATIVE mg/dL   Protein, ur 30 (A) NEGATIVE mg/dL   Nitrite NEGATIVE NEGATIVE   Leukocytes,Ua MODERATE (A) NEGATIVE   RBC / HPF 0-5 0 - 5 RBC/hpf   WBC, UA 21-50 0 - 5 WBC/hpf   Bacteria, UA NONE SEEN NONE SEEN   Squamous Epithelial / LPF 6-10 0 - 5   Mucus PRESENT    Hyaline Casts, UA PRESENT     Comment: Performed at [facility], [place] [address] [place], [place] [address]  Comprehensive metabolic panel     Status: Abnormal   Collection Time: 07/26/19  9:06 PM  Result Value Ref Range   Sodium 139 135 - 145 mmol/L   Potassium 2.5 (LL) 3.5 - 5.1 mmol/L    Comment: CRITICAL RESULT CALLED TO, READ BACK BY AND VERIFIED WITH: OXENDINE,J @ 2151 ON [id number] BY POTEAT,S    Chloride 99 98 - 111 mmol/L   CO2 27 22 - 32 mmol/L   Glucose, Bld 150 (H) 70 - 99 mg/dL   BUN 13 8 - 23 mg/dL   Creatinine, Ser 0.92 0.44 - 1.00 mg/dL   Calcium 9.0 8.9 - 10.3 mg/dL   Total Protein 8.0 6.5 - 8.1 g/dL   Albumin 3.3 (L) 3.5 - 5.0 g/dL   AST 18 15 - 41 U/L   ALT 10 0 - 44 U/L   Alkaline Phosphatase 155 (H) 38 - 126 U/L   Total Bilirubin 1.1 0.3 - 1.2 mg/dL   GFR calc non Af Amer >60 >60 mL/min   GFR calc Af Amer >60 >60 mL/min   Anion gap 13 5 - 15    Comment: Performed  at [facility], [place] [address], [place], [place] [address]  Lipase, blood     Status: None   Collection Time: 07/26/19  9:06 PM  Result Value Ref Range   Lipase 29 11 - 51 U/L    Comment: Performed at [facility], [place] [address], [place], [place] [address]  CBC with  Differential     Status: None   Collection Time: 07/26/19  9:06 PM  Result Value Ref Range   WBC 7.8 4.0 - 10.5 K/uL   RBC 4.49 3.87 - 5.11 MIL/uL   Hemoglobin 12.3 12.0 - 15.0 g/dL   HCT 39.3 36.0 - 46.0 %   MCV 87.5 80.0 - 100.0 fL   MCH 27.4 26.0 - 34.0 pg   MCHC 31.3 30.0 - 36.0 g/dL   RDW 13.9 11.5 - 15.5 %   Platelets 336 150 - 400 K/uL   nRBC 0.0 0.0 - 0.2 %   Neutrophils Relative % 76 %   Neutro Abs 6.0 1.7 - 7.7 K/uL   Lymphocytes Relative 17 %   Lymphs Abs 1.3 0.7 - 4.0 K/uL   Monocytes Relative 6 %   Monocytes Absolute 0.4 0.1 - 1.0 K/uL   Eosinophils Relative 1 %   Eosinophils Absolute 0.0 0.0 - 0.5 K/uL   Basophils Relative 0 %   Basophils Absolute 0.0 0.0 - 0.1 K/uL   Immature Granulocytes 0 %   Abs Immature Granulocytes 0.03 0.00 - 0.07 K/uL    Comment: Performed at [facility], [place] [address] [place], [place] [address]  POC occult blood, ED     Status: None   Collection Time: 07/26/19  9:24 PM  Result Value Ref Range   Fecal Occult Bld NEGATIVE NEGATIVE  Magnesium     Status: Abnormal   Collection Time: 07/26/19 10:06 PM  Result Value Ref Range   Magnesium 1.6 (L) 1.7 - 2.4 mg/dL    Comment: Performed at [facility], [place] [address], [place], [place] [address]   Ct Abdomen Pelvis W Contrast  Result Date: 07/26/2019 CLINICAL DATA:  73 year old female with abdominal pain, nausea vomiting. Diarrhea for 3 weeks. EXAM: CT ABDOMEN AND PELVIS WITH CONTRAST TECHNIQUE: Multidetector CT imaging of the abdomen and pelvis was performed using the standard protocol following bolus administration of intravenous contrast. CONTRAST:   12mL OMNIPAQUE IOHEXOL 300 MG/ML  SOLN COMPARISON:  CT of the abdomen pelvis dated 03/04/2019 and pelvic MRI dated 06/29/2019 FINDINGS: Lower chest: The visualized lung bases are clear. No intra-abdominal free air or free fluid. Hepatobiliary: No focal liver abnormality is seen. No gallstones, gallbladder wall thickening, or biliary dilatation. Pancreas: Unremarkable. No pancreatic ductal dilatation or surrounding inflammatory changes. Spleen: Splenomegaly measuring 16 cm in AP length. Adrenals/Urinary Tract: The adrenal glands are unremarkable. Moderate right renal atrophy and cortical irregularity and scarring. The left kidney is unremarkable. There is no hydronephrosis on either side. There is symmetric enhancement and excretion of contrast by both kidneys. The visualized ureters appear unremarkable. The urinary bladder is grossly unremarkable as visualized. Stomach/Bowel: There is moderate stool throughout the colon. There is no bowel obstruction or active inflammation. A 13 mm fat attenuating lesion in the ascending colon (series 2, image 38 and coronal series 5 image 75) most consistent with a lipoma. The appendix is not visualized with certainty. No inflammatory changes identified in the right lower quadrant. Vascular/Lymphatic: Advanced aortoiliac atherosclerotic disease. The IVC is unremarkable. No portal venous gas. There is no adenopathy. Reproductive: Complex cystic structure superior to the bladder as seen on the prior MRI. This is not well evaluated on this CT but characterized as hematometra on the prior MRI. Other: None Musculoskeletal: There is degenerative changes of the spine. Total left hip arthroplasty with associated streak artifact limiting evaluation of the pelvic structures. No acute osseous pathology. IMPRESSION: 1. No acute  intra-abdominal or pelvic pathology. 2. Moderate colonic stool burden. No bowel obstruction or active inflammation. 3. Splenomegaly. 4. Cystic structure within the  pelvis corresponding to the hematometra described prior MRI. 5. Aortic Atherosclerosis (ICD10-I70.0). Electronically Signed   By: Anner Crete M.D.   On: 07/26/2019 23:41    Pending Labs Unresulted Labs (From admission, onward)    Start     Ordered   07/27/19 0500  CBC  Tomorrow morning,   R     07/27/19 0044   07/27/19 [id number]  Basic metabolic panel  Tomorrow morning,   R     07/27/19 0044   07/27/19 0500  Magnesium  Tomorrow morning,   R     07/27/19 0044   07/27/19 0019  TSH  Add-on,   AD     07/27/19 0018   07/27/19 0019  Hemoglobin A1c  Add-on,   AD     07/27/19 0018   07/26/19 2352  SARS CORONAVIRUS 2 (TAT 6-24 HRS) Nasopharyngeal Nasopharyngeal Swab  (Asymptomatic/Tier 2 Patients Labs)  Once,   STAT    Question Answer Comment  Is this test for diagnosis or screening Screening   Symptomatic for COVID-19 as defined by CDC No   Hospitalized for COVID-19 No   Admitted to ICU for COVID-19 No   Previously tested for COVID-19 No   Resident in a congregate (group) care setting No   Employed in healthcare setting No   Pregnant No      07/26/19 2351   07/26/19 2121  C difficile quick scan w PCR reflex  (C Difficile quick screen w PCR reflex panel)  Once, for 24 hours,   STAT     07/26/19 2120   07/26/19 2121  Gastrointestinal Panel by PCR , Stool  (Gastrointestinal Panel by PCR, Stool                                                                                                                                                     *Does Not include CLOSTRIDIUM DIFFICILE testing.**If CDIFF testing is needed, select the C Difficile Quick Screen w PCR reflex order below)  Once,   STAT     07/26/19 2120   07/26/19 2046  Urine culture  ONCE - STAT,   STAT     07/26/19 2046          Vitals/Pain Today's Vitals   07/26/19 2115 07/27/19 0030 07/27/19 0130 07/27/19 0230  BP:  (!) 142/71 (!) 148/59 (!) 142/46  Pulse:  74 69 70  Resp:  16 16 19   Temp: 98.6 F (37 C)     TempSrc:  Rectal     SpO2:  97% 99% 98%  Weight:      Height:      PainSc:  0-No pain  0-No pain    Isolation Precautions Enteric  precautions (UV disinfection)  Medications Medications  potassium chloride 10 mEq in 100 mL IVPB (10 mEq Intravenous Not Given 07/27/19 0103)  potassium chloride 10 mEq in 100 mL IVPB (10 mEq Intravenous New Bag/Given 07/27/19 0217)  enoxaparin (LOVENOX) injection 40 mg (40 mg Subcutaneous Given 07/27/19 0218)  sodium chloride flush (NS) 0.9 % injection 3 mL (3 mLs Intravenous Given 07/27/19 0106)  0.9 % NaCl with KCl 20 mEq/ L  infusion ( Intravenous New Bag/Given 07/27/19 0216)  acetaminophen (TYLENOL) tablet 650 mg (has no administration in time range)    Or  acetaminophen (TYLENOL) suppository 650 mg (has no administration in time range)  ondansetron (ZOFRAN) tablet 4 mg ( Oral See Alternative 07/27/19 0050)    Or  ondansetron (ZOFRAN) injection 4 mg (4 mg Intravenous Given 07/27/19 0050)  gabapentin (NEURONTIN) capsule 300 mg (300 mg Oral Given 07/27/19 0218)  insulin glargine (LANTUS) injection 20 Units (20 Units Subcutaneous Given 07/27/19 0218)  levothyroxine (SYNTHROID) tablet 125 mcg (has no administration in time range)  insulin aspart (novoLOG) injection 0-9 Units (has no administration in time range)  sodium chloride (PF) 0.9 % injection (  Given by Other 07/26/19 2318)  iohexol (OMNIPAQUE) 300 MG/ML solution 100 mL (100 mLs Intravenous Contrast Given 07/26/19 2318)  magnesium sulfate IVPB 1 g 100 mL (0 g Intravenous Stopped 07/27/19 0150)  sodium chloride 0.9 % bolus 1,000 mL (0 mLs Intravenous Stopped 07/27/19 0150)  potassium chloride 20 MEQ/15ML (10%) solution 40 mEq (40 mEq Oral Given 07/27/19 0217)    Mobility walks with device       R Recommendations: See Admitting Provider Note

## 2019-07-28 LAB — HEMOGLOBIN A1C
Hgb A1c MFr Bld: 6.1 % — ABNORMAL HIGH (ref 4.8–5.6)
Mean Plasma Glucose: 128 mg/dL

## 2019-07-29 ENCOUNTER — Other Ambulatory Visit (INDEPENDENT_AMBULATORY_CARE_PROVIDER_SITE_OTHER): Payer: Medicare HMO

## 2019-07-29 ENCOUNTER — Encounter: Payer: Self-pay | Admitting: Physician Assistant

## 2019-07-29 ENCOUNTER — Encounter

## 2019-07-29 ENCOUNTER — Ambulatory Visit: Payer: Medicare HMO | Admitting: Physician Assistant

## 2019-07-29 VITALS — BP 122/54 | HR 79 | Temp 98.1°F | Ht 60.0 in | Wt 165.0 lb

## 2019-07-29 DIAGNOSIS — K5900 Constipation, unspecified: Secondary | ICD-10-CM

## 2019-07-29 LAB — COMPREHENSIVE METABOLIC PANEL
ALT: 9 U/L (ref 0–35)
AST: 15 U/L (ref 0–37)
Albumin: 3.2 g/dL — ABNORMAL LOW (ref 3.5–5.2)
Alkaline Phosphatase: 112 U/L (ref 39–117)
BUN: 13 mg/dL (ref 6–23)
CO2: 27 mEq/L (ref 19–32)
Calcium: 8.8 mg/dL (ref 8.4–10.5)
Chloride: 106 mEq/L (ref 96–112)
Creatinine, Ser: 0.84 mg/dL (ref 0.40–1.20)
GFR: 66.38 mL/min (ref >60.00)
Glucose, Bld: 166 mg/dL — ABNORMAL HIGH (ref 70–99)
Potassium: 4.1 mEq/L (ref 3.5–5.1)
Sodium: 143 mEq/L (ref 135–145)
Total Bilirubin: 0.5 mg/dL (ref 0.2–1.2)
Total Protein: 6.4 g/dL (ref 6.0–8.3)

## 2019-07-29 NOTE — Progress Notes (Signed)
Chief Complaint: Abdominal pain, constipation  HPI:    [name] is a 73 year old female with a past medical history as listed below, known to Dr. Tarri Glenn, who was referred to me by Everardo Beals, NP for a complaint of abdominal pain and constipation.    08/31/2018 office visit with Dr. Tarri Glenn for rectal bleeding and a normocytic anemia with a hemoglobin of 10.8.    09/03/2018 patient had a colonoscopy for rectal bleeding and unexplained anemia.  The perianal and digital rectal examinations were normal.  Copious quantities of stool from the anus and the rectum, in the rectosigmoid colon and in the distal sigmoid colon, precluding visualization.  The procedure was terminated due to inability to make any meaningful assessment of the colonic mucosa.  Preparation of the colon was unsatisfactory.  Was recommend the patient repeat colonoscopy at the next available appointment with MiraLAX 17 g twice daily for the 3 days prior to the routine purgative prep.  Patient declined.    07/26/2019-07/27/2019 patient was admitted to the hospital for nausea vomiting and diarrhea.  Labs showed a low potassium of 2.5.  FOBT was negative.  CT abdomen pelvis with contrast was negative for acute intra-abdominal or pelvic pathology.  Moderate colonic stool burden in the colon without bowel obstruction or inflammation.  Cystic structure within the pelvis was seen corresponding to the hematomametra seen on prior MR pelvis.  Also reported loose watery stools 4-5 times per day and some "dark black stool" after taking Pepto-Bismol.  COVID negative.  Stool culture negative.  C. difficile negative.  It was thought possibly that presenting abdominal symptoms related to constipation.  She passes stool the morning before leaving and flatus and was tolerating a diet.  She was prescribed as needed MiraLAX, Dulcolax and scheduled Colace.  CBC with a hemoglobin of 10.7.    Today, the patient tells me that since being out of the hospital  over the past 2 days she has been continuing with liquid watery stools.  Tells me the first day she got out she took the MiraLAX as well as 2 Colace and 1 Dulcolax, today she has just taken the stool softeners and stopped the MiraLAX.  She had another watery stool this morning.  She feels completely empty and denies any further abdominal pain, nausea or vomiting.  She tolerated a solid meal of spaghetti last night with no further symptoms.    Denies fever, chills or continued abdominal pain, nausea or vomiting.  Past Medical History:  Diagnosis Date  . Arthritis   . Bilateral lower extremity edema   . Flank pain   . Full dentures   . History of cervical cancer    11/ 1994  Stage IB  s/p  high dose radiation brachytherapy @ [facility] 01/ 1995--- per pt no recurrence  . History of chronic bronchitis   . History of hyperthyroidism    d 03/ 2011---due to grave's disease--- s/p  RAI treatment 04/ 2011  . History of sepsis 05/03/2018   due to UTI with klebsiella/ pyelonephritis/ ureteral obstruction cause by stone  . Hyperlipidemia   . Hypothyroidism, postradioiodine therapy    endocrinologist--  dr Loanne Drilling--  dx graves disease and s/p RAI i131 treatement 4/ 2011  . Nauseated   . Oral thrush 05/03/2018  . Pneumonia   . Type 2 diabetes mellitus treated with insulin ([place])    FOLLOWED BY PCP  . Urgency of urination   . Wears glasses     Past Surgical History:  Procedure Laterality Date  . APPENDECTOMY    . CATARACT EXTRACTION W/ INTRAOCULAR LENS  IMPLANT, BILATERAL  2004  approx.  . CYSTOSCOPY WITH STENT PLACEMENT Right 05/04/2018   Procedure: CYSTOSCOPY WITH STENT PLACEMENT AND RETROGRADE PYELOGRAM;  Surgeon: Ceasar Mons, MD;  Location: [facility];  Service: Urology;  Laterality: Right;  . CYSTOSCOPY/URETEROSCOPY/HOLMIUM LASER/STENT PLACEMENT Right 05/19/2018   Procedure: CYSTOSCOPY, URETEROSCOPY/HOLMIUM LASER, STONE BASKETRY/ STENT EXCHANGE;  Surgeon: Ceasar Mons, MD;   Location: [facility];  Service: Urology;  Laterality: Right;  . EXCISION MASS LEFT CHEST WALL  09-20-2007   dr Grandville Silos  [facility]  . Radioactive Iodine Therapy     for thyroid  . REVISION TOTAL HIP ARTHROPLASTY Left early 2000s  . TANDEM RING INSERTION  1995   dr Aldean Ast @ [facility]   EUA w/ tandem placement in ovid  (for direct high dose radiation brachytherapy , cervical cancer)  . TOTAL HIP ARTHROPLASTY Left 1990s  . TRANSTHORACIC ECHOCARDIOGRAM  08/07/2017   mild focal basal hypertrophy of the septum,  ef [id number], grade 1 diastolic dysfunction/  atrial septum with lipomatous hypertrophy/  trivial PR    Current Outpatient Medications  Medication Sig Dispense Refill  . alendronate (FOSAMAX) 10 MG tablet Take 10 mg by mouth every 7 (seven) days.     . bisacodyl (DULCOLAX) 5 MG EC tablet Take 1 tablet (5 mg total) by mouth daily as needed for moderate constipation. 30 tablet 1  . docusate sodium (COLACE) 100 MG capsule Take 1 capsule (100 mg total) by mouth 2 (two) times daily. 60 capsule 0  . ergocalciferol (VITAMIN D2) 50000 UNITS capsule Take 50,000 Units by mouth once a week. Sundays    . gabapentin (NEURONTIN) 100 MG capsule Take 300 mg by mouth 2 (two) times daily.     Marland Kitchen gemfibrozil (LOPID) 600 MG tablet Take 600 mg by mouth 2 (two) times daily.      . insulin aspart (NOVOLOG) 100 UNIT/ML injection Inject 0-8 Units into the skin 3 (three) times daily before meals. Sliding scale    . insulin degludec (TRESIBA FLEXTOUCH) 100 UNIT/ML SOPN FlexTouch Pen Inject 40 Units into the skin at bedtime.     Marland Kitchen levothyroxine (SYNTHROID) 125 MCG tablet Take 125 mcg by mouth daily before breakfast.     . metFORMIN (GLUCOPHAGE) 500 MG tablet Take 500 mg by mouth 2 (two) times daily with a meal.    . methocarbamol (ROBAXIN) 500 MG tablet Take 500 mg by mouth 2 (two) times daily as needed for muscle spasms.     Marland Kitchen oxyCODONE ER (XTAMPZA ER) 9 MG C12A Take 9 mg by mouth 2 (two) times daily.    .  polyethylene glycol (MIRALAX) 17 g packet Take 17 g by mouth daily as needed for moderate constipation. 14 each 0   Current Facility-Administered Medications  Medication Dose Route Frequency Provider Last Rate Last Dose  . 0.9 %  sodium chloride infusion  500 mL Intravenous Once [place], MD        Allergies as of 07/29/2019 - Review Complete 07/27/2019  Allergen Reaction Noted  . Codeine Nausea Only 10/19/2008    Family History  Problem Relation Age of Onset  . Emphysema Father   . Diabetes Father   . Emphysema Sister   . Emphysema Brother   . Cancer Mother        Skin Cancer  . Diabetes Mother     Social History   Socioeconomic History  .  Marital status: Married    Spouse name: Not on file  . Number of children: 2  . Years of education: Not on file  . Highest education level: Not on file  Occupational History  . Occupation: Disabled  Social Needs  . Financial resource strain: Not on file  . Food insecurity    Worry: Not on file    Inability: Not on file  . Transportation needs    Medical: Not on file    Non-medical: Not on file  Tobacco Use  . Smoking status: Former Smoker    Years: 5.00    Types: Cigarettes    Quit date: 05/13/1982    Years since quitting: 37.2  . Smokeless tobacco: Never Used  Substance and Sexual Activity  . Alcohol use: No  . Drug use: No  . Sexual activity: Not Currently  Lifestyle  . Physical activity    Days per week: Not on file    Minutes per session: Not on file  . Stress: Not on file  Relationships  . Social Herbalist on phone: Not on file    Gets together: Not on file    Attends religious service: Not on file    Active member of club or organization: Not on file    Attends meetings of clubs or organizations: Not on file    Relationship status: Not on file  . Intimate partner violence    Fear of current or ex partner: Not on file    Emotionally abused: Not on file    Physically abused: Not on file     Forced sexual activity: Not on file  Other Topics Concern  . Not on file  Social History Narrative   Married and lives with husband   Has children   Housewife   disabled    Review of Systems:    Constitutional: No weight loss, fever or chills Cardiovascular: No chest pain Respiratory: No SOB  Gastrointestinal: See HPI and otherwise negative   Physical Exam:  Vital signs: BP (!) 122/54 (BP Location: Left Arm, Patient Position: Sitting, Cuff Size: Normal)   Pulse 79   Temp 98.1 F (36.7 C) (Other (Comment))   Ht 5' (1.524 m)   Wt 165 lb (74.8 kg)   BMI 32.22 kg/m   Constitutional:   Pleasant Caucasian female appears to be in NAD, Well developed, Well nourished, alert and cooperative Respiratory: Respirations even and unlabored. Lungs clear to auscultation bilaterally.   No wheezes, crackles, or rhonchi.  Cardiovascular: Normal S1, S2. No MRG. Regular rate and rhythm. No peripheral edema, cyanosis or pallor.  Gastrointestinal:  Soft, nondistended, nontender. No rebound or guarding. Normal bowel sounds. No appreciable masses or hepatomegaly. Psychiatric: Demonstrates good judgement and reason without abnormal affect or behaviors.  MOST RECENT LABS AND IMAGING: CBC    Component Value Date/Time   WBC 8.0 07/27/2019 0525   RBC 3.76 (L) 07/27/2019 0525   HGB 10.7 (L) 07/27/2019 0525   HCT 33.6 (L) 07/27/2019 0525   PLT 285 07/27/2019 0525   MCV 89.4 07/27/2019 0525   MCH 28.5 07/27/2019 0525   MCHC 31.8 07/27/2019 0525   RDW 14.2 07/27/2019 0525   LYMPHSABS 1.3 07/26/2019 2106   MONOABS 0.4 07/26/2019 2106   EOSABS 0.0 07/26/2019 2106   BASOSABS 0.0 07/26/2019 2106    CMP     Component Value Date/Time   NA 140 07/27/2019 0525   NA 144 08/21/2017 1553   K 4.0 07/27/2019 0525  CL 105 07/27/2019 0525   CO2 24 07/27/2019 0525   GLUCOSE 129 (H) 07/27/2019 0525   BUN 11 07/27/2019 0525   BUN 21 08/21/2017 1553   CREATININE 0.85 07/27/2019 0525   CALCIUM 8.0 (L)  07/27/2019 0525   CALCIUM 9.6 09/19/2010 2220   PROT 8.0 07/26/2019 2106   ALBUMIN 3.3 (L) 07/26/2019 2106   AST 18 07/26/2019 2106   ALT 10 07/26/2019 2106   ALKPHOS 155 (H) 07/26/2019 2106   BILITOT 1.1 07/26/2019 2106   GFRNONAA >60 07/27/2019 0525   GFRAA >60 07/27/2019 0525    Assessment: 1.  Constipation: Believed to have caused recent episodes of nausea vomiting and lower abdominal pain,she is having brown liquid stool at this time feels completely empty with no further symptoms over the past 2 days since recent hospitalization 2.  Nausea and vomiting 3.  Hypokalemia: Corrected in the hospital, will recheck today  Plan: 1.  Ordered repeat CMP to check potassium 2.  Discussed with patient that she should continue on 2 Colace a day.  She may want to discontinue the Dulcolax as it sounds as though she is still having watery diarrhea.  Discussed in detail that if she feels she was becoming constipated then would recommend she start the MiraLAX on a daily basis.  I feels the prescription laxatives would be too strong for her at this point.  She may need these in the future depending on where she goes after recent hospitalization. 3.  Briefly discussed that patient's last colonoscopy was incomplete.  She does not wish to repeat one at this time.  She is aware of risk for colorectal cancer. 4.  Patient to follow in clinic as needed with [place] in the future.  Ellouise Newer, PA-C [facility] 07/29/2019, 3:05 PM  Cc: Everardo Beals, NP

## 2019-07-29 NOTE — Patient Instructions (Addendum)
Use your Colace 2 tablets daily, stop dulcolax.   Your provider has requested that you go to the basement level for lab work before leaving today. Press "B" on the elevator. The lab is located at the first door on the left as you exit the elevator.

## 2019-07-31 NOTE — Progress Notes (Signed)
Reviewed. I agree with documentation including the assessment and plan.  [place] Merry L. Monai Hindes, MD, MPH 

## 2019-08-12 ENCOUNTER — Encounter: Payer: Self-pay | Admitting: Podiatry

## 2019-08-12 ENCOUNTER — Ambulatory Visit: Payer: Medicare HMO | Admitting: Podiatry

## 2019-08-12 ENCOUNTER — Other Ambulatory Visit: Payer: Self-pay

## 2019-08-12 DIAGNOSIS — M79674 Pain in right toe(s): Secondary | ICD-10-CM | POA: Diagnosis not present

## 2019-08-12 DIAGNOSIS — L84 Corns and callosities: Secondary | ICD-10-CM | POA: Diagnosis not present

## 2019-08-12 DIAGNOSIS — E1142 Type 2 diabetes mellitus with diabetic polyneuropathy: Secondary | ICD-10-CM | POA: Diagnosis not present

## 2019-08-12 DIAGNOSIS — B351 Tinea unguium: Secondary | ICD-10-CM | POA: Diagnosis not present

## 2019-08-12 DIAGNOSIS — M79675 Pain in left toe(s): Secondary | ICD-10-CM

## 2019-08-12 NOTE — Patient Instructions (Signed)
Diabetes Mellitus and Foot Care Foot care is an important part of your health, especially when you have diabetes. Diabetes may cause you to have problems because of poor blood flow (circulation) to your feet and legs, which can cause your skin to:  Become thinner and drier.  Break more easily.  Heal more slowly.  Peel and crack. You may also have nerve damage (neuropathy) in your legs and feet, causing decreased feeling in them. This means that you may not notice minor injuries to your feet that could lead to more serious problems. Noticing and addressing any potential problems early is the best way to prevent future foot problems. How to care for your feet Foot hygiene  Wash your feet daily with warm water and mild soap. Do not use hot water. Then, pat your feet and the areas between your toes until they are completely dry. Do not soak your feet as this can dry your skin.  Trim your toenails straight across. Do not dig under them or around the cuticle. File the edges of your nails with an emery board or nail file.  Apply a moisturizing lotion or petroleum jelly to the skin on your feet and to dry, brittle toenails. Use lotion that does not contain alcohol and is unscented. Do not apply lotion between your toes. Shoes and socks  Wear clean socks or stockings every day. Make sure they are not too tight. Do not wear knee-high stockings since they may decrease blood flow to your legs.  Wear shoes that fit properly and have enough cushioning. Always look in your shoes before you put them on to be sure there are no objects inside.  To break in new shoes, wear them for just a few hours a day. This prevents injuries on your feet. Wounds, scrapes, corns, and calluses  Check your feet daily for blisters, cuts, bruises, sores, and redness. If you cannot see the bottom of your feet, use a mirror or ask someone for help.  Do not cut corns or calluses or try to remove them with medicine.  If you  find a minor scrape, cut, or break in the skin on your feet, keep it and the skin around it clean and dry. You may clean these areas with mild soap and water. Do not clean the area with peroxide, alcohol, or iodine.  If you have a wound, scrape, corn, or callus on your foot, look at it several times a day to make sure it is healing and not infected. Check for: ? Redness, swelling, or pain. ? Fluid or blood. ? Warmth. ? Pus or a bad smell. General instructions  Do not cross your legs. This may decrease blood flow to your feet.  Do not use heating pads or hot water bottles on your feet. They may burn your skin. If you have lost feeling in your feet or legs, you may not know this is happening until it is too late.  Protect your feet from hot and cold by wearing shoes, such as at the beach or on hot pavement.  Schedule a complete foot exam at least once a year (annually) or more often if you have foot problems. If you have foot problems, report any cuts, sores, or bruises to your health care provider immediately. Contact a health care provider if:  You have a medical condition that increases your risk of infection and you have any cuts, sores, or bruises on your feet.  You have an injury that is not   healing.  You have redness on your legs or feet.  You feel burning or tingling in your legs or feet.  You have pain or cramps in your legs and feet.  Your legs or feet are numb.  Your feet always feel cold.  You have pain around a toenail. Get help right away if:  You have a wound, scrape, corn, or callus on your foot and: ? You have pain, swelling, or redness that gets worse. ? You have fluid or blood coming from the wound, scrape, corn, or callus. ? Your wound, scrape, corn, or callus feels warm to the touch. ? You have pus or a bad smell coming from the wound, scrape, corn, or callus. ? You have a fever. ? You have a red line going up your leg. Summary  Check your feet every day  for cuts, sores, red spots, swelling, and blisters.  Moisturize feet and legs daily.  Wear shoes that fit properly and have enough cushioning.  If you have foot problems, report any cuts, sores, or bruises to your health care provider immediately.  Schedule a complete foot exam at least once a year (annually) or more often if you have foot problems. This information is not intended to replace advice given to you by your health care provider. Make sure you discuss any questions you have with your health care provider. Document Released: 10/31/2000 Document Revised: 12/16/2017 Document Reviewed: 12/05/2016 Elsevier Patient Education  2020 [employer] Inc[employer]   Onychomycosis/Fungal Toenails  WHAT IS IT? An infection that lies within the keratin of your nail plate that is caused by a fungus.  WHY ME? Fungal infections affect all ages, sexes, races, and creeds.  There may be many factors that predispose you to a fungal infection such as age, coexisting medical conditions such as diabetes, or an autoimmune disease; stress, medications, fatigue, genetics, etc.  Bottom line: fungus thrives in a warm, moist environment and your shoes offer such a location.  IS IT CONTAGIOUS? Theoretically, yes.  You do not want to share shoes, nail clippers or files with someone who has fungal toenails.  Walking around barefoot in the same room or sleeping in the same bed is unlikely to transfer the organism.  It is important to realize, however, that fungus can spread easily from one nail to the next on the same foot.  HOW DO WE TREAT THIS?  There are several ways to treat this condition.  Treatment may depend on many factors such as age, medications, pregnancy, liver and kidney conditions, etc.  It is best to ask your doctor which options are available to you.  1. No treatment.   Unlike many other medical concerns, you can live with this condition.  However for many people this can be a painful condition and may lead to  ingrown toenails or a bacterial infection.  It is recommended that you keep the nails cut short to help reduce the amount of fungal nail. 2. Topical treatment.  These range from herbal remedies to prescription strength nail lacquers.  About 40-50% effective, topicals require twice daily application for approximately 9 to 12 months or until an entirely new nail has grown out.  The most effective topicals are medical grade medications available through physicians offices. 3. Oral antifungal medications.  With an 80-90% cure rate, the most common oral medication requires 3 to 4 months of therapy and stays in your system for a year as the new nail grows out.  Oral antifungal medications do require   blood work to make sure it is a safe drug for you.  A liver function panel will be performed prior to starting the medication and after the first month of treatment.  It is important to have the blood work performed to avoid any harmful side effects.  In general, this medication safe but blood work is required. 4. Laser Therapy.  This treatment is performed by applying a specialized laser to the affected nail plate.  This therapy is noninvasive, fast, and non-painful.  It is not covered by insurance and is therefore, out of pocket.  The results have been very good with a 80-95% cure rate.  The [facility] is the only practice in the area to offer this therapy. 5. Permanent Nail Avulsion.  Removing the entire nail so that a new nail will not grow back. 

## 2019-08-16 NOTE — Progress Notes (Signed)
Subjective: [name] is seen today for preventative diabetic foot care for follow up of painful plantar callus left hallux andpainful, elongated, thickened toenails 1-5 b/l feet that she cannot cut. Pain interferes with daily activities. Aggravating factor includes wearing enclosed shoe gear and relieved with periodic debridement.  Patient states she was hospitalized recently for abdominal problems.   Current Outpatient Medications on File Prior to Visit  Medication Sig  . alendronate (FOSAMAX) 10 MG tablet Take 10 mg by mouth every 7 (seven) days.   . ALPRAZolam (XANAX) 0.25 MG tablet   . bisacodyl (DULCOLAX) 5 MG EC tablet Take 1 tablet (5 mg total) by mouth daily as needed for moderate constipation.  . docusate sodium (COLACE) 100 MG capsule Take 1 capsule (100 mg total) by mouth 2 (two) times daily.  . ergocalciferol (VITAMIN D2) 50000 UNITS capsule Take 50,000 Units by mouth once a week. Sundays  . gabapentin (NEURONTIN) 100 MG capsule Take 300 mg by mouth 2 (two) times daily.   Marland Kitchen gemfibrozil (LOPID) 600 MG tablet Take 600 mg by mouth 2 (two) times daily.    . insulin aspart (NOVOLOG) 100 UNIT/ML injection Inject 0-8 Units into the skin 3 (three) times daily before meals. Sliding scale  . insulin degludec (TRESIBA FLEXTOUCH) 100 UNIT/ML SOPN FlexTouch Pen Inject 40 Units into the skin at bedtime.   Marland Kitchen levothyroxine (SYNTHROID) 125 MCG tablet Take 125 mcg by mouth daily before breakfast.   . metFORMIN (GLUCOPHAGE) 500 MG tablet Take 500 mg by mouth 2 (two) times daily with a meal.  . methocarbamol (ROBAXIN) 500 MG tablet Take 500 mg by mouth 2 (two) times daily as needed for muscle spasms.   Marland Kitchen oxybutynin (DITROPAN-XL) 10 MG 24 hr tablet Take 10 mg by mouth daily.  Marland Kitchen oxyCODONE ER (XTAMPZA ER) 9 MG C12A Take 9 mg by mouth 2 (two) times daily.  . polyethylene glycol (MIRALAX) 17 g packet Take 17 g by mouth daily as needed for moderate constipation.   Current Facility-Administered  Medications on File Prior to Visit  Medication  . 0.9 %  sodium chloride infusion     Allergies  Allergen Reactions  . Codeine Nausea Only   Objective:  Vascular Examination: Capillary refill time <3 seconds x 10 digits.  Dorsalis pedis present b/l.  Posterior tibial pulses present b/l.  Digital hair absent b/l.  Skin temperature gradient WNL b/l.   Dermatological Examination: Skin with normal turgor, texture and tone b/l.  Toenails 1-5 b/l discolored, thick, dystrophic with subungual debris and pain with palpation to nailbeds due to thickness of nails.  Hyperkeratotic lesion subhallux IPJ left foot with tenderness to palpation. There is subdermal hemorrhage noted. No edema, no erythema, no drainage, no flocculence.  Musculoskeletal: Muscle strength 5/5 to all LE muscle groups b/l.   Hammertoes 2nd digits b/l.  No pain, crepitus or joint limitation noted with ROM.   Neurological Examination: Protective sensation diminished b/l. with 10 gram monofilament bilaterally.  Assessment: Painful onychomycosis toenails 1-5 b/l  Preulcerative callus left hallux NIDDM with neuropathy  Plan: 1. Toenails 1-5 b/l were debrided in length and girth without iatrogenic bleeding. 2. Preulcerative callus pared subhallux IPJ left foot utilizing sterile scalpel blade without incident. 3. Patient to continue soft, supportive shoe gear. 4. Patient to report any pedal injuries to medical professional immediately. 5. Follow up 9 weeks. 6. Patient/POA to call should there be a concern in the interim.

## 2019-09-06 ENCOUNTER — Other Ambulatory Visit: Payer: Self-pay | Admitting: Gastroenterology

## 2019-09-06 DIAGNOSIS — E538 Deficiency of other specified B group vitamins: Secondary | ICD-10-CM

## 2019-10-31 ENCOUNTER — Encounter: Payer: Self-pay | Admitting: Podiatry

## 2019-10-31 ENCOUNTER — Other Ambulatory Visit: Payer: Self-pay

## 2019-10-31 ENCOUNTER — Ambulatory Visit: Payer: Medicare HMO | Admitting: Podiatry

## 2019-10-31 DIAGNOSIS — B351 Tinea unguium: Secondary | ICD-10-CM

## 2019-10-31 DIAGNOSIS — E1142 Type 2 diabetes mellitus with diabetic polyneuropathy: Secondary | ICD-10-CM

## 2019-10-31 DIAGNOSIS — M79675 Pain in left toe(s): Secondary | ICD-10-CM

## 2019-10-31 DIAGNOSIS — M79674 Pain in right toe(s): Secondary | ICD-10-CM | POA: Diagnosis not present

## 2019-10-31 DIAGNOSIS — L84 Corns and callosities: Secondary | ICD-10-CM | POA: Diagnosis not present

## 2019-11-07 NOTE — Progress Notes (Signed)
Subjective: [name] presents with h/o diabetes and diabetic neuropathy. She presents with chronic preulcerative callus left hallux and mycotic toenails. Pain is aggravated when wearing enclosed shoe gear. Pain is relieved with periodic professional debridement.  Everardo Beals, NP is her PCP.   Medications reviewed in chart.  Allergies  Allergen Reactions  . Codeine Nausea Only   Objective: There were no vitals filed for this visit.  Vascular Examination: Capillary refill time <3 seconds  b/l.  Dorsalis pedis and posterior tibial pulses present b/l.  Digital hair absent b/l.  Skin temperature gradient WNL b/l.  Dermatological Examination: Skin with normal turgor, texture and tone b/l.  Toenails 1-5 b/l discolored, thick, dystrophic with subungual debris and pain with palpation to nailbeds due to thickness of nails.  Porokeratotic lesions subhallux IPJ left foot 2.0 x 2.0 cm with subdermal hemorrhage and tenderness to palpation. No erythema, no edema, no drainage, no flocculence.  Musculoskeletal: Muscle strength 5/5 to all LE muscle groups b/l.  Hammertoes 2nd digits b/l.  No pain, crepitus or joint limitation with passive/active ROM.  Neurological: Sensation diminished with 10 gram monofilament bilaterally.  Assessment: 1. Painful onychomycosis toenails 1-5 b/l Preulcerative porokeratosis subhallux IPJ left foot NIDDM with Diabetic neuropathy  Plan: 1. Continue diabetic foot care principles. Literature dispensed on today. 2. Toenails 1-5 b/l were debrided in length and girth without iatrogenic bleeding. 3. Preulcerative porokeratosis subhallux IPJ left foot pared.Cleansed digit and applied triple antibiotic ointment and light dressing.  Dispensed new toe cap for daily use/protection.  4. Patient to continue soft, supportive shoe gear. 5. Patient to report any pedal injuries to medical professional. 6. Follow up 4 weeks.  7. Patient/POA to call should  there be a concern in the interim.

## 2019-11-29 ENCOUNTER — Other Ambulatory Visit: Payer: Self-pay

## 2019-11-29 ENCOUNTER — Inpatient Hospital Stay (HOSPITAL_COMMUNITY)
Admission: EM | Admit: 2019-11-29 | Discharge: 2019-12-02 | DRG: 689 | Disposition: A | Discharge status: Home / Self Care | Payer: Medicare HMO | Attending: Internal Medicine | Admitting: Internal Medicine

## 2019-11-29 ENCOUNTER — Emergency Department (HOSPITAL_COMMUNITY): Payer: Medicare HMO

## 2019-11-29 ENCOUNTER — Encounter (HOSPITAL_COMMUNITY): Payer: Self-pay

## 2019-11-29 DIAGNOSIS — N390 Urinary tract infection, site not specified: Principal | ICD-10-CM | POA: Diagnosis present

## 2019-11-29 DIAGNOSIS — E890 Postprocedural hypothyroidism: Secondary | ICD-10-CM | POA: Diagnosis present

## 2019-11-29 DIAGNOSIS — I10 Essential (primary) hypertension: Secondary | ICD-10-CM | POA: Diagnosis present

## 2019-11-29 DIAGNOSIS — Z87891 Personal history of nicotine dependence: Secondary | ICD-10-CM

## 2019-11-29 DIAGNOSIS — N12 Tubulo-interstitial nephritis, not specified as acute or chronic: Secondary | ICD-10-CM

## 2019-11-29 DIAGNOSIS — B954 Other streptococcus as the cause of diseases classified elsewhere: Secondary | ICD-10-CM | POA: Diagnosis present

## 2019-11-29 DIAGNOSIS — Z79899 Other long term (current) drug therapy: Secondary | ICD-10-CM | POA: Diagnosis not present

## 2019-11-29 DIAGNOSIS — Z833 Family history of diabetes mellitus: Secondary | ICD-10-CM | POA: Diagnosis not present

## 2019-11-29 DIAGNOSIS — Z7983 Long term (current) use of bisphosphonates: Secondary | ICD-10-CM | POA: Diagnosis not present

## 2019-11-29 DIAGNOSIS — U071 COVID-19: Secondary | ICD-10-CM | POA: Diagnosis present

## 2019-11-29 DIAGNOSIS — E785 Hyperlipidemia, unspecified: Secondary | ICD-10-CM | POA: Diagnosis present

## 2019-11-29 DIAGNOSIS — Z794 Long term (current) use of insulin: Secondary | ICD-10-CM | POA: Diagnosis not present

## 2019-11-29 DIAGNOSIS — Z8701 Personal history of pneumonia (recurrent): Secondary | ICD-10-CM

## 2019-11-29 DIAGNOSIS — Z87440 Personal history of urinary (tract) infections: Secondary | ICD-10-CM

## 2019-11-29 DIAGNOSIS — E1169 Type 2 diabetes mellitus with other specified complication: Secondary | ICD-10-CM | POA: Diagnosis present

## 2019-11-29 DIAGNOSIS — E876 Hypokalemia: Secondary | ICD-10-CM | POA: Diagnosis present

## 2019-11-29 DIAGNOSIS — R7881 Bacteremia: Secondary | ICD-10-CM | POA: Diagnosis present

## 2019-11-29 DIAGNOSIS — Z825 Family history of asthma and other chronic lower respiratory diseases: Secondary | ICD-10-CM | POA: Diagnosis not present

## 2019-11-29 DIAGNOSIS — Z96642 Presence of left artificial hip joint: Secondary | ICD-10-CM | POA: Diagnosis present

## 2019-11-29 DIAGNOSIS — Z79890 Hormone replacement therapy (postmenopausal): Secondary | ICD-10-CM

## 2019-11-29 DIAGNOSIS — Z8541 Personal history of malignant neoplasm of cervix uteri: Secondary | ICD-10-CM

## 2019-11-29 DIAGNOSIS — D539 Nutritional anemia, unspecified: Secondary | ICD-10-CM | POA: Diagnosis present

## 2019-11-29 DIAGNOSIS — D649 Anemia, unspecified: Secondary | ICD-10-CM | POA: Diagnosis present

## 2019-11-29 DIAGNOSIS — E119 Type 2 diabetes mellitus without complications: Secondary | ICD-10-CM

## 2019-11-29 DIAGNOSIS — B961 Klebsiella pneumoniae [K. pneumoniae] as the cause of diseases classified elsewhere: Secondary | ICD-10-CM | POA: Diagnosis present

## 2019-11-29 DIAGNOSIS — E039 Hypothyroidism, unspecified: Secondary | ICD-10-CM | POA: Diagnosis present

## 2019-11-29 DIAGNOSIS — Z885 Allergy status to narcotic agent status: Secondary | ICD-10-CM

## 2019-11-29 DIAGNOSIS — E669 Obesity, unspecified: Secondary | ICD-10-CM | POA: Diagnosis present

## 2019-11-29 DIAGNOSIS — J449 Chronic obstructive pulmonary disease, unspecified: Secondary | ICD-10-CM | POA: Diagnosis present

## 2019-11-29 LAB — CBC WITH DIFFERENTIAL/PLATELET
Abs Immature Granulocytes: 0.04 10*3/uL (ref 0.00–0.07)
Abs Immature Granulocytes: 0.03 10*3/uL (ref 0.00–0.07)
Basophils Absolute: 0 10*3/uL (ref 0.0–0.1)
Basophils Absolute: 0 10*3/uL (ref 0.0–0.1)
Basophils Relative: 0 %
Basophils Relative: 0 %
Eosinophils Absolute: 0 10*3/uL (ref 0.0–0.5)
Eosinophils Absolute: 0 10*3/uL (ref 0.0–0.5)
Eosinophils Relative: 0 %
Eosinophils Relative: 0 %
HCT: 22.9 % — ABNORMAL LOW (ref 36.0–46.0)
HCT: 37.1 % (ref 36.0–46.0)
Hemoglobin: 7.2 g/dL — ABNORMAL LOW (ref 12.0–15.0)
Hemoglobin: 11.8 g/dL — ABNORMAL LOW (ref 12.0–15.0)
Immature Granulocytes: 1 %
Immature Granulocytes: 1 %
Lymphocytes Relative: 9 %
Lymphocytes Relative: 12 %
Lymphs Abs: 0.8 10*3/uL (ref 0.7–4.0)
Lymphs Abs: 0.7 10*3/uL (ref 0.7–4.0)
MCH: 28.3 pg (ref 26.0–34.0)
MCH: 28.3 pg (ref 26.0–34.0)
MCHC: 31.4 g/dL (ref 30.0–36.0)
MCHC: 31.8 g/dL (ref 30.0–36.0)
MCV: 90.2 fL (ref 80.0–100.0)
MCV: 89 fL (ref 80.0–100.0)
Monocytes Absolute: 0.8 10*3/uL (ref 0.1–1.0)
Monocytes Absolute: 0.6 10*3/uL (ref 0.1–1.0)
Monocytes Relative: 9 %
Monocytes Relative: 11 %
Neutro Abs: 6.9 10*3/uL (ref 1.7–7.7)
Neutro Abs: 4.4 10*3/uL (ref 1.7–7.7)
Neutrophils Relative %: 81 %
Neutrophils Relative %: 76 %
Platelets: 284 10*3/uL (ref 150–400)
Platelets: 198 10*3/uL (ref 150–400)
RBC: 2.54 MIL/uL — ABNORMAL LOW (ref 3.87–5.11)
RBC: 4.17 MIL/uL (ref 3.87–5.11)
RDW: 13.1 % (ref 11.5–15.5)
RDW: 13.3 % (ref 11.5–15.5)
WBC: 8.5 10*3/uL (ref 4.0–10.5)
WBC: 5.8 10*3/uL (ref 4.0–10.5)
nRBC: 0 % (ref 0.0–0.2)
nRBC: 0 % (ref 0.0–0.2)

## 2019-11-29 LAB — URINALYSIS, ROUTINE W REFLEX MICROSCOPIC
Bilirubin Urine: NEGATIVE
Glucose, UA: NEGATIVE mg/dL
Ketones, ur: NEGATIVE mg/dL
Nitrite: NEGATIVE
Protein, ur: 30 mg/dL — AB
Specific Gravity, Urine: 1.014 (ref 1.005–1.030)
WBC, UA: >50 WBC/hpf — ABNORMAL HIGH (ref 0–5)
pH: 6 (ref 5.0–8.0)

## 2019-11-29 LAB — COMPREHENSIVE METABOLIC PANEL
ALT: 12 U/L (ref 0–44)
ALT: 12 U/L (ref 0–44)
AST: 23 U/L (ref 15–41)
AST: 23 U/L (ref 15–41)
Albumin: 3.7 g/dL (ref 3.5–5.0)
Albumin: 3.4 g/dL — ABNORMAL LOW (ref 3.5–5.0)
Alkaline Phosphatase: 147 U/L — ABNORMAL HIGH (ref 38–126)
Alkaline Phosphatase: 128 U/L — ABNORMAL HIGH (ref 38–126)
Anion gap: 16 — ABNORMAL HIGH (ref 5–15)
Anion gap: 11 (ref 5–15)
BUN: 17 mg/dL (ref 8–23)
BUN: 18 mg/dL (ref 8–23)
CO2: 24 mmol/L (ref 22–32)
CO2: 24 mmol/L (ref 22–32)
Calcium: 9.3 mg/dL (ref 8.9–10.3)
Calcium: 8.7 mg/dL — ABNORMAL LOW (ref 8.9–10.3)
Chloride: 103 mmol/L (ref 98–111)
Chloride: 105 mmol/L (ref 98–111)
Creatinine, Ser: 0.97 mg/dL (ref 0.44–1.00)
Creatinine, Ser: 0.91 mg/dL (ref 0.44–1.00)
GFR calc Af Amer: >60 mL/min (ref >60)
GFR calc Af Amer: >60 mL/min (ref >60)
GFR calc non Af Amer: 58 mL/min — ABNORMAL LOW (ref >60)
GFR calc non Af Amer: >60 mL/min (ref >60)
Glucose, Bld: 98 mg/dL (ref 70–99)
Glucose, Bld: 88 mg/dL (ref 70–99)
Potassium: 3.4 mmol/L — ABNORMAL LOW (ref 3.5–5.1)
Potassium: 3.5 mmol/L (ref 3.5–5.1)
Sodium: 143 mmol/L (ref 135–145)
Sodium: 140 mmol/L (ref 135–145)
Total Bilirubin: 0.5 mg/dL (ref 0.3–1.2)
Total Bilirubin: 0.8 mg/dL (ref 0.3–1.2)
Total Protein: 7.4 g/dL (ref 6.5–8.1)
Total Protein: 6.8 g/dL (ref 6.5–8.1)

## 2019-11-29 LAB — RESPIRATORY PANEL BY RT PCR (FLU A&B, COVID)
Influenza A by PCR: NEGATIVE
Influenza B by PCR: NEGATIVE
SARS Coronavirus 2 by RT PCR: POSITIVE — AB

## 2019-11-29 LAB — PHOSPHORUS
Phosphorus: 3.8 mg/dL (ref 2.5–4.6)
Phosphorus: 3.8 mg/dL (ref 2.5–4.6)

## 2019-11-29 LAB — GLUCOSE, CAPILLARY
Glucose-Capillary: 130 mg/dL — ABNORMAL HIGH (ref 70–99)
Glucose-Capillary: 186 mg/dL — ABNORMAL HIGH (ref 70–99)

## 2019-11-29 LAB — IRON AND TIBC
Iron: 32 ug/dL (ref 28–170)
Saturation Ratios: 8 % — ABNORMAL LOW (ref 10.4–31.8)
TIBC: 377 ug/dL (ref 250–450)
UIBC: 345 ug/dL

## 2019-11-29 LAB — LACTIC ACID, PLASMA: Lactic Acid, Venous: 1.4 mmol/L (ref 0.5–1.9)

## 2019-11-29 LAB — TYPE AND SCREEN
ABO/RH(D): O POS
Antibody Screen: NEGATIVE

## 2019-11-29 LAB — VITAMIN B12: Vitamin B-12: 390 pg/mL (ref 180–914)

## 2019-11-29 LAB — OCCULT BLOOD X 1 CARD TO LAB, STOOL: Fecal Occult Bld: NEGATIVE

## 2019-11-29 LAB — FOLATE: Folate: 10.6 ng/mL (ref >5.9)

## 2019-11-29 LAB — ABO/RH: ABO/RH(D): O POS

## 2019-11-29 LAB — MAGNESIUM
Magnesium: 1.5 mg/dL — ABNORMAL LOW (ref 1.7–2.4)
Magnesium: 1.5 mg/dL — ABNORMAL LOW (ref 1.7–2.4)

## 2019-11-29 LAB — CBG MONITORING, ED: Glucose-Capillary: 78 mg/dL (ref 70–99)

## 2019-11-29 LAB — FERRITIN: Ferritin: 56 ng/mL (ref 11–307)

## 2019-11-29 MED ORDER — MAGNESIUM SULFATE 2 GM/50ML IV SOLN
2.0000 g | Freq: Once | INTRAVENOUS | Status: AC
  Administered 2019-11-29: 2 g via INTRAVENOUS
  Filled 2019-11-29: qty 50

## 2019-11-29 MED ORDER — POTASSIUM CHLORIDE IN NACL 20-0.9 MEQ/L-% IV SOLN
INTRAVENOUS | Status: AC
  Filled 2019-11-29: qty 1000

## 2019-11-29 MED ORDER — PROCHLORPERAZINE EDISYLATE 10 MG/2ML IJ SOLN: 5.0000 mg | INTRAMUSCULAR | Status: DC | PRN

## 2019-11-29 MED ORDER — OXYCODONE ER 9 MG PO C12A: 9.0000 mg | EXTENDED_RELEASE_CAPSULE | Freq: Two times a day (BID) | ORAL | Status: DC

## 2019-11-29 MED ORDER — SODIUM CHLORIDE 0.9 % IV SOLN
2.0000 g | Freq: Once | INTRAVENOUS | Status: AC
  Administered 2019-11-29: 2 g via INTRAVENOUS
  Filled 2019-11-29: qty 20

## 2019-11-29 MED ORDER — METFORMIN HCL 500 MG PO TABS
500.0000 mg | ORAL_TABLET | Freq: Two times a day (BID) | ORAL | Status: DC
  Administered 2019-11-29 – 2019-12-02 (×7): 500 mg via ORAL
  Filled 2019-11-29 (×7): qty 1

## 2019-11-29 MED ORDER — OXYBUTYNIN CHLORIDE ER 5 MG PO TB24
10.0000 mg | ORAL_TABLET | Freq: Every day | ORAL | Status: DC
  Administered 2019-11-29 – 2019-12-02 (×4): 10 mg via ORAL
  Filled 2019-11-29 (×6): qty 2

## 2019-11-29 MED ORDER — BISACODYL 5 MG PO TBEC: 5.0000 mg | DELAYED_RELEASE_TABLET | Freq: Every day | ORAL | Status: DC | PRN

## 2019-11-29 MED ORDER — INSULIN DEGLUDEC 100 UNIT/ML ~~LOC~~ SOPN: 20.0000 [IU] | PEN_INJECTOR | Freq: Every day | SUBCUTANEOUS | Status: DC

## 2019-11-29 MED ORDER — GUAIFENESIN-DM 100-10 MG/5ML PO SYRP
5.0000 mL | ORAL_SOLUTION | ORAL | Status: DC | PRN
  Administered 2019-11-29 – 2019-11-30 (×2): 5 mL via ORAL
  Filled 2019-11-29 (×2): qty 10

## 2019-11-29 MED ORDER — METHOCARBAMOL 500 MG PO TABS
500.0000 mg | ORAL_TABLET | Freq: Two times a day (BID) | ORAL | Status: DC | PRN
  Administered 2019-11-29 – 2019-11-30 (×2): 500 mg via ORAL
  Filled 2019-11-29 (×5): qty 1

## 2019-11-29 MED ORDER — INSULIN GLARGINE 100 UNIT/ML ~~LOC~~ SOLN
20.0000 [IU] | Freq: Every day | SUBCUTANEOUS | Status: DC
  Administered 2019-11-29 – 2019-12-01 (×3): 20 [IU] via SUBCUTANEOUS
  Filled 2019-11-29 (×4): qty 0.2

## 2019-11-29 MED ORDER — GABAPENTIN 300 MG PO CAPS
300.0000 mg | ORAL_CAPSULE | Freq: Two times a day (BID) | ORAL | Status: DC
  Administered 2019-11-29 – 2019-12-02 (×6): 300 mg via ORAL
  Filled 2019-11-29 (×6): qty 1

## 2019-11-29 MED ORDER — POTASSIUM CHLORIDE CRYS ER 20 MEQ PO TBCR
40.0000 meq | EXTENDED_RELEASE_TABLET | Freq: Once | ORAL | Status: AC
  Administered 2019-11-29: 40 meq via ORAL
  Filled 2019-11-29: qty 2

## 2019-11-29 MED ORDER — GEMFIBROZIL 600 MG PO TABS
600.0000 mg | ORAL_TABLET | Freq: Two times a day (BID) | ORAL | Status: DC
  Administered 2019-11-29 – 2019-12-02 (×7): 600 mg via ORAL
  Filled 2019-11-29 (×7): qty 1

## 2019-11-29 MED ORDER — LEVOTHYROXINE SODIUM 25 MCG PO TABS
125.0000 ug | ORAL_TABLET | Freq: Every day | ORAL | Status: DC
  Administered 2019-11-30 – 2019-12-02 (×3): 125 ug via ORAL
  Filled 2019-11-29 (×3): qty 1

## 2019-11-29 MED ORDER — SODIUM CHLORIDE 0.9 % IV SOLN
1.0000 g | INTRAVENOUS | Status: DC
  Administered 2019-11-30: 1 g via INTRAVENOUS
  Filled 2019-11-29: qty 1

## 2019-11-29 MED ORDER — ACETAMINOPHEN 325 MG PO TABS
650.0000 mg | ORAL_TABLET | Freq: Four times a day (QID) | ORAL | Status: DC | PRN
  Administered 2019-11-29 – 2019-11-30 (×3): 650 mg via ORAL
  Filled 2019-11-29 (×3): qty 2

## 2019-11-29 MED ORDER — ACETAMINOPHEN 650 MG RE SUPP: 650.0000 mg | Freq: Four times a day (QID) | RECTAL | Status: DC | PRN

## 2019-11-29 MED ORDER — INSULIN ASPART 100 UNIT/ML ~~LOC~~ SOLN
0.0000 [IU] | Freq: Three times a day (TID) | SUBCUTANEOUS | Status: DC
  Administered 2019-11-29 – 2019-11-30 (×2): 1 [IU] via SUBCUTANEOUS
  Filled 2019-11-29: qty 0.09

## 2019-11-29 NOTE — ED Notes (Signed)
When staff was leaving pt's room, daughter asked NT "are you an NT +1?" Staff responded she was a NT. Daughter stated "Oh so you are only an NT+1 and you stuck her hand?" Staff member explained hospital policies in the ED.

## 2019-11-29 NOTE — ED Notes (Signed)
Pt had small, formed/soft bowel movement.  Pt cleaned, linens/purewick/brief changed.  Will continue to monitor.

## 2019-11-29 NOTE — ED Notes (Signed)
When staff walked into room to collect blood, staff explained procedures and the plan of care to pt and daughter. Hospital policies regarding visitation was discussed in detail. RN established an a 32 G IV in the left upper arm. Daughter became upset and said "I don't know why yall always put IVs there. It is hurting her and now she won't be able bend her arm. Take it out." RN assessed site and asked pt if it was causing any pain. Pt denied discomfort. Daughter said "You should have just called IV team." IV was removed per daughter's request and bandaged appropriately.

## 2019-11-29 NOTE — ED Notes (Signed)
Patient transported to CT 

## 2019-11-29 NOTE — ED Notes (Signed)
Daughter, Maudie Mercury, notified of pts assigned bed and that pt will be transported upstairs shortly.

## 2019-11-29 NOTE — ED Notes (Signed)
Verified with pharmacy that IV magnesium sulfate and 0.9% NaCl with 20 mEq/L are compatible.

## 2019-11-29 NOTE — ED Triage Notes (Signed)
Patient arrived with family who states patient is confused, that she is complaining of lower back pain. Reporting this normally happens when she has a kidney infection. Patient also recently slid out of a chair and home and has complaints or right knee pain.

## 2019-11-29 NOTE — ED Notes (Signed)
Patients family made aware that if she is more confused that she can wait with her but that we are only having visitors wait for circumstances like this, to just to let them know if asked that it was approved, she states she knows our policy that she works at [employer]. Family also upset due to wait times. This nurse apologized for the wait but informed that we will try and get her seen as soon as we can.

## 2019-11-29 NOTE — ED Provider Notes (Signed)
Signout from Dr. Waverly Ferrari.  74 year old female history of back pain complaining of worsening back pain in the setting of a fever and some lethargy.  Positive urinalysis.  Ceftriaxone ordered.  Will likely need admission for IV antibiotics. Physical Exam  BP (!) 158/60   Pulse 86   Temp (!) 100.6 F (38.1 C) (Oral)   Resp 16   Ht 5' 1"  (1.549 m) Comment: Daughter stated  Wt 74.8 kg Comment: Stated by daughter  SpO2 100%   BMI 31.18 kg/m   Physical Exam  ED Course/Procedures     Procedures  MDM  Discussed with Dr. Olevia Bowens from the hospitalist service who will evaluate the patient for admission.       Hayden Rasmussen, MD 11/29/19 1728

## 2019-11-29 NOTE — Progress Notes (Signed)
[facility] admitting physician.  The patient removed her IV prior to the completion of the 2 grams of ceftriaxone initially ordered. The staff stated that she had a least half of the dose. She is under 100 Kg at 74.8 Kg currently, so I do not believe that re-infusing another gram of ceftriaxone is needed at this time.   Tennis Must, MD

## 2019-11-29 NOTE — ED Notes (Signed)
Pt provided with ham sandwich and apple juice.

## 2019-11-29 NOTE — ED Notes (Signed)
[phone] 

## 2019-11-29 NOTE — Progress Notes (Signed)
TRH progress note.  I spoke to the patient's daughter [name] at [phone]) and updated her about the patient's results and hospitalization. Her questions were answered. She wants the patient's physicians to be aware of her mother's hospitalization. I advised her that they normally received H&P and DC summaries. Other records will be available on epic.  Tennis Must, MD

## 2019-11-29 NOTE — ED Notes (Signed)
IV team collected blood.

## 2019-11-29 NOTE — ED Notes (Signed)
This RN explained  policy regarding visitation to pts daughter at bedside.  Pt and daugher understand that, per hospital policy, visitor is to remain in pt room at all times.  If visitor is to leave pts room and exit ED, visitor is unable to return for 24 hours.  Pts visitor verbalized understanding.  Will continue to monitor.

## 2019-11-29 NOTE — ED Provider Notes (Signed)
[facility] DEPT Provider Note   CSN: [id number] Arrival date & time: 11/29/19  4920     History Chief Complaint  Patient presents with  . Back Pain    [name] is a 74 y.o. female.  Patient presents to the emergency department for evaluation of back pain.  Patient reportedly has some element of chronic back pain and has been seeing orthopedics for this.  She has been having continuous pain for the last several weeks.  Tonight, however, she has been complaining of increased pain and also has been noted to be extremely weak and confused.  Daughter reports that when this happened she generally has some type of urinary infection.  She has not had any associated nausea or vomiting.  Patient denies abdominal pain.  Daughter reports that the patient slid out of bed twice tonight.        Past Medical History:  Diagnosis Date  . Arthritis   . Bilateral lower extremity edema   . Flank pain   . Full dentures   . History of cervical cancer    11/ 1994  Stage IB  s/p  high dose radiation brachytherapy @ [facility] 01/ 1995--- per pt no recurrence  . History of chronic bronchitis   . History of hyperthyroidism    d 03/ 2011---due to grave's disease--- s/p  RAI treatment 04/ 2011  . History of sepsis 05/03/2018   due to UTI with klebsiella/ pyelonephritis/ ureteral obstruction cause by stone  . Hyperlipidemia   . Hypothyroidism, postradioiodine therapy    endocrinologist--  dr Loanne Drilling--  dx graves disease and s/p RAI i131 treatement 4/ 2011  . Nauseated   . Oral thrush 05/03/2018  . Pneumonia   . Type 2 diabetes mellitus treated with insulin ([place])    FOLLOWED BY PCP  . Urgency of urination   . Wears glasses     Patient Active Problem List   Diagnosis Date Noted  . Acute UTI 11/29/2019  . Nausea, vomiting, and diarrhea 07/27/2019  . Hypokalemia 07/27/2019  . Hypomagnesemia 07/27/2019  . History of cervical cancer 03/18/2019  . Sepsis ([place])  05/04/2018  . Acute lower UTI 05/04/2018  . Uncontrolled type 2 diabetes mellitus with hyperglycemia ([place]) 05/04/2018  . ARF (acute renal failure) ([place]) 05/04/2018  . Essential hypertension 05/04/2018  . Bilateral lower extremity edema 07/31/2017  . MUSCLE CRAMPS 09/19/2010  . NUMBNESS 09/19/2010  . Hypothyroidism 06/06/2010  . THRUSH 11/29/2008  . Type 2 diabetes mellitus ([place]) 10/20/2008  . Hyperlipidemia associated with type 2 diabetes mellitus ([place]) 10/20/2008  . BRONCHITIS, ACUTE WITH BRONCHOSPASM 10/20/2008  . EMPHYSEMA 10/20/2008  . C O P D 10/20/2008  . CROHN'S DISEASE 10/20/2008  . TOBACCO ABUSE-HISTORY OF 10/20/2008    Past Surgical History:  Procedure Laterality Date  . APPENDECTOMY    . CATARACT EXTRACTION W/ INTRAOCULAR LENS  IMPLANT, BILATERAL  2004  approx.  . CYSTOSCOPY WITH STENT PLACEMENT Right 05/04/2018   Procedure: CYSTOSCOPY WITH STENT PLACEMENT AND RETROGRADE PYELOGRAM;  Surgeon: Ceasar Mons, MD;  Location: [facility];  Service: Urology;  Laterality: Right;  . CYSTOSCOPY/URETEROSCOPY/HOLMIUM LASER/STENT PLACEMENT Right 05/19/2018   Procedure: CYSTOSCOPY, URETEROSCOPY/HOLMIUM LASER, STONE BASKETRY/ STENT EXCHANGE;  Surgeon: Ceasar Mons, MD;  Location: [facility];  Service: Urology;  Laterality: Right;  . EXCISION MASS LEFT CHEST WALL  09-20-2007   dr Grandville Silos  [facility]  . Radioactive Iodine Therapy     for thyroid  . REVISION TOTAL HIP  ARTHROPLASTY Left early 2000s  . TANDEM RING INSERTION  1995   dr Aldean Ast @ [facility]   EUA w/ tandem placement in ovid  (for direct high dose radiation brachytherapy , cervical cancer)  . TOTAL HIP ARTHROPLASTY Left 1990s  . TRANSTHORACIC ECHOCARDIOGRAM  08/07/2017   mild focal basal hypertrophy of the septum,  ef 16-10%, grade 1 diastolic dysfunction/  atrial septum with lipomatous hypertrophy/  trivial PR     OB History   No obstetric history on file.     Family History  Problem  Relation Age of Onset  . Emphysema Father   . Diabetes Father   . Emphysema Sister   . Emphysema Brother   . Cancer Mother        Skin Cancer  . Diabetes Mother     Social History   Tobacco Use  . Smoking status: Former Smoker    Years: 5.00    Types: Cigarettes    Quit date: 05/13/1982    Years since quitting: 37.5  . Smokeless tobacco: Never Used  Substance Use Topics  . Alcohol use: No  . Drug use: No    Home Medications Prior to Admission medications   Medication Sig Start Date End Date Taking? Authorizing Provider  alendronate (FOSAMAX) 10 MG tablet Take 10 mg by mouth every 7 (seven) days.     [provider]  ALPRAZolam Duanne Moron) 0.25 MG tablet  08/11/19   [provider]  bisacodyl (DULCOLAX) 5 MG EC tablet Take 1 tablet (5 mg total) by mouth daily as needed for moderate constipation. 07/27/19 07/26/20  Donne Hazel, MD  ergocalciferol (VITAMIN D2) 50000 UNITS capsule Take 50,000 Units by mouth once a week. Sundays    [provider]  gabapentin (NEURONTIN) 100 MG capsule Take 300 mg by mouth 2 (two) times daily.     [provider]  gemfibrozil (LOPID) 600 MG tablet Take 600 mg by mouth 2 (two) times daily.      [provider]  insulin aspart (NOVOLOG) 100 UNIT/ML injection Inject 0-8 Units into the skin 3 (three) times daily before meals. Sliding scale    [provider]  insulin degludec (TRESIBA FLEXTOUCH) 100 UNIT/ML SOPN FlexTouch Pen Inject 40 Units into the skin at bedtime.     [provider]  levothyroxine (SYNTHROID) 125 MCG tablet Take 125 mcg by mouth daily before breakfast.  03/01/19   [provider]  metFORMIN (GLUCOPHAGE) 500 MG tablet Take 500 mg by mouth 2 (two) times daily with a meal.    [provider]  methocarbamol (ROBAXIN) 500 MG tablet Take 500 mg by mouth 2 (two) times daily as needed for muscle spasms.  04/22/19   [provider]  oxybutynin (DITROPAN-XL) 10 MG  24 hr tablet Take 10 mg by mouth daily. 07/26/19   [provider]  oxyCODONE ER (XTAMPZA ER) 9 MG C12A Take 9 mg by mouth 2 (two) times daily.    [provider]  polyethylene glycol (MIRALAX) 17 g packet Take 17 g by mouth daily as needed for moderate constipation. 07/27/19   Donne Hazel, MD    Allergies    Codeine  Review of Systems   Review of Systems  Musculoskeletal: Positive for back pain.  Neurological: Positive for weakness.  Psychiatric/Behavioral: Positive for confusion.  All other systems reviewed and are negative.   Physical Exam Updated Vital Signs BP (!) 181/73   Pulse 80   Temp (!) 100.6 F (38.1  C) (Oral)   Resp 16   Ht 5' 1"  (1.549 m) Comment: Daughter stated  Wt 74.8 kg Comment: Stated by daughter  SpO2 100%   BMI 31.18 kg/m   Physical Exam Vitals and nursing note reviewed.  Constitutional:      General: She is not in acute distress.    Appearance: Normal appearance. She is well-developed.  HENT:     Head: Normocephalic and atraumatic.     Right Ear: Hearing normal.     Left Ear: Hearing normal.     Nose: Nose normal.  Eyes:     Conjunctiva/sclera: Conjunctivae normal.     Pupils: Pupils are equal, round, and reactive to light.  Cardiovascular:     Rate and Rhythm: Regular rhythm.     Heart sounds: S1 normal and S2 normal. No murmur. No friction rub. No gallop.   Pulmonary:     Effort: Pulmonary effort is normal. No respiratory distress.     Breath sounds: Normal breath sounds.  Chest:     Chest wall: No tenderness.  Abdominal:     General: Bowel sounds are normal.     Palpations: Abdomen is soft.     Tenderness: There is no abdominal tenderness. There is no guarding or rebound. Negative signs include Murphy's sign and McBurney's sign.     Hernia: No hernia is present.  Musculoskeletal:        General: Normal range of motion.     Cervical back: Normal range of motion and neck supple.  Skin:    General: Skin is warm and  dry.     Findings: No rash.  Neurological:     Mental Status: She is alert. She is disoriented.     GCS: GCS eye subscore is 4. GCS verbal subscore is 5. GCS motor subscore is 6.     Cranial Nerves: No cranial nerve deficit.     Sensory: No sensory deficit.     Coordination: Coordination normal.  Psychiatric:        Speech: Speech normal.        Behavior: Behavior normal.        Thought Content: Thought content normal.     ED Results / Procedures / Treatments   Labs (all labs ordered are listed, but only abnormal results are displayed) Labs Reviewed  URINALYSIS, ROUTINE W REFLEX MICROSCOPIC - Abnormal; Notable for the following components:      Result Value   Color, Urine YELLOW (*)    APPearance HAZY (*)    Hgb urine dipstick SMALL (*)    Protein, ur 30 (*)    Leukocytes,Ua LARGE (*)    WBC, UA >50 (*)    Bacteria, UA MANY (*)    All other components within normal limits  CBC WITH DIFFERENTIAL/PLATELET - Abnormal; Notable for the following components:   RBC 2.54 (*)    Hemoglobin 7.2 (*)    HCT 22.9 (*)    All other components within normal limits  COMPREHENSIVE METABOLIC PANEL - Abnormal; Notable for the following components:   Potassium 3.4 (*)    Alkaline Phosphatase 147 (*)    GFR calc non Af Amer 58 (*)    Anion gap 16 (*)    All other components within normal limits  CULTURE, BLOOD (ROUTINE X 2)  CULTURE, BLOOD (ROUTINE X 2)  URINE CULTURE  RESPIRATORY PANEL BY RT PCR (FLU A&B, COVID)  LACTIC ACID, PLASMA  MAGNESIUM  PHOSPHORUS  POC OCCULT BLOOD, ED  EKG None  Radiology CT RENAL STONE STUDY  Result Date: 11/29/2019 CLINICAL DATA:  Flank pain with kidney stone suspected EXAM: CT ABDOMEN AND PELVIS WITHOUT CONTRAST TECHNIQUE: Multidetector CT imaging of the abdomen and pelvis was performed following the standard protocol without IV contrast. COMPARISON:  07/26/2019 and pelvis MRI 06/29/2019 FINDINGS: Lower chest:  No contributory findings. Hepatobiliary:  No focal liver abnormality.No evidence of biliary obstruction or stone. Pancreas: Generalized atrophy. Spleen: Unremarkable. Adrenals/Urinary Tract: Negative adrenals. No hydronephrosis or stone. Asymmetric right renal cortical thinning and lobulation attributed to scarring. Unremarkable bladder. Stomach/Bowel: No obstruction. No appendicitis. Formed stool throughout the colon. Vascular/Lymphatic: No acute vascular abnormality. Atherosclerotic calcification. No mass or adenopathy. Reproductive:Low-density dilatation of the endometrial cavity with coarse calcification at the level of the cervix, hematometra by pelvis MRI. No acute or interval finding. Other: No ascites or pneumoperitoneum. Musculoskeletal: No acute abnormalities. Left hip arthroplasty. Lumbar spine degeneration with L4-5 anterolisthesis. IMPRESSION: 1. No acute finding. 2. Hematometra as evaluated on recent pelvis MRI. 3. Right renal cortical scarring. 4.  Aortic Atherosclerosis (ICD10-I70.0). Electronically Signed   By: Monte Fantasia M.D.   On: 11/29/2019 07:20    Procedures Procedures (including critical care time)  Medications Ordered in ED Medications  cefTRIAXone (ROCEPHIN) 2 g in sodium chloride 0.9 % 100 mL IVPB (2 g Intravenous New Bag/Given 11/29/19 0748)  potassium chloride SA (KLOR-CON) CR tablet 40 mEq (has no administration in time range)    ED Course  I have reviewed the triage vital signs and the nursing notes.  Pertinent labs & imaging results that were available during my care of the patient were reviewed by me and considered in my medical decision making (see chart for details).    MDM Rules/Calculators/A&P                      Patient presents to the emergency department for evaluation of back pain.  Family reports that she does have some element of chronic back pain but also has had a history of kidney stones as well as pyelonephritis.  She apparently has had [place] with concomitant ureteral stones in the past.   CT scan performed, however, does not show any ureterolithiasis.  Urinalysis does suggest infection without any signs of septic shock at this time.  Patient treated with IV Rocephin, will require hospitalization for further management.  Final Clinical Impression(s) / ED Diagnoses Final diagnoses:  Pyelonephritis    Rx / DC Orders ED Discharge Orders    None       Katai Marsico, Gwenyth Allegra, MD 11/29/19 [id number]

## 2019-11-29 NOTE — ED Notes (Addendum)
IV team at bedside 

## 2019-11-29 NOTE — ED Notes (Signed)
Patient and family aware urine sample is needed

## 2019-11-29 NOTE — H&P (Signed)
History and Physical    [name]:[id number] DOB: 11-Jan-1946 DOA: 11/29/2019  PCP: [name] Beals, NP   Patient coming from: Home.  I have personally briefly reviewed patient's old medical records in [place]  Chief Complaint: Back pain.  HPI: [name] is a 74 y.o. female with medical history significant of osteoarthritis, bilateral lower extremity edema, flank pain, history of cervical cancer, chronic bronchitis, hyperthyroidism due to Graves' disease with history of RAI treatment, hypothyroidism, history of pneumonia, type 2 diabetes, hyperlipidemia, history of UTI/sepsis due to Klebsiella pyelonephritis who is coming to the emergency department due to fever, left-sided back pain, dysuria and suprapubic tenderness.  No nausea, vomiting, diarrhea, melena or hematochezia.  Occasional constipation.  She denies headache, sore throat, dyspnea, chest pain, palpitations, dizziness diaphoresis, PND orthopnea.  No polyuria, polydipsia, polyphagia or blurred vision.  ED Course: Initial vital signs temperature 100.6 F, pulse 86, respiration 19, blood pressure 151/70 mmHg and O2 sat 99% on room air.  The patient was given 2 g of ceftriaxone IVPB.  Blood/urine culture and sensitivity were sent to the lab prior to antibiotic infusion.  Her urinalysis shows small hemoglobinuria, proteinuria 30 mg/dL, large leukocyte esterase, 6-10 RBC, more than 50 WBC and many bacteria on microscopic urine examination.  CBC shows a white count of 8.5, hemoglobin 7.2 g/dL and platelets 284.  Sodium 143, potassium 3.4, chloride 103 and CO2 24 mmol/L.  Renal function and glucose are within expected parameters.  All hepatic functions are within normal range, except for an alkaline phosphatase of 147 units/L.  Imaging: CT renal study did not show any acute finding.  Previously seen hematometra, right renal cortical scarring and aortic stenosis were reported as well.  Please see images for regular  report for further detail.   Review of Systems: As per HPI otherwise 10 point review of systems negative.   Past Medical History:  Diagnosis Date  . Arthritis   . Bilateral lower extremity edema   . Flank pain   . Full dentures   . History of cervical cancer    11/ 1994  Stage IB  s/p  high dose radiation brachytherapy @ [facility] 01/ 1995--- per pt no recurrence  . History of chronic bronchitis   . History of hyperthyroidism    d 03/ 2011---due to grave's disease--- s/p  RAI treatment 04/ 2011  . History of sepsis 05/03/2018   due to UTI with klebsiella/ pyelonephritis/ ureteral obstruction cause by stone  . Hyperlipidemia   . Hypothyroidism, postradioiodine therapy    endocrinologist--  dr Loanne Drilling--  dx graves disease and s/p RAI i131 treatement 4/ 2011  . Nauseated   . Oral thrush 05/03/2018  . Pneumonia   . Type 2 diabetes mellitus treated with insulin ([place])    FOLLOWED BY PCP  . Urgency of urination   . Wears glasses     Past Surgical History:  Procedure Laterality Date  . APPENDECTOMY    . CATARACT EXTRACTION W/ INTRAOCULAR LENS  IMPLANT, BILATERAL  2004  approx.  . CYSTOSCOPY WITH STENT PLACEMENT Right 05/04/2018   Procedure: CYSTOSCOPY WITH STENT PLACEMENT AND RETROGRADE PYELOGRAM;  Surgeon: Ceasar Mons, MD;  Location: [facility];  Service: Urology;  Laterality: Right;  . CYSTOSCOPY/URETEROSCOPY/HOLMIUM LASER/STENT PLACEMENT Right 05/19/2018   Procedure: CYSTOSCOPY, URETEROSCOPY/HOLMIUM LASER, STONE BASKETRY/ STENT EXCHANGE;  Surgeon: Ceasar Mons, MD;  Location: [facility];  Service: Urology;  Laterality: Right;  . EXCISION MASS LEFT CHEST WALL  09-20-2007  dr Grandville Silos  [facility]  . Radioactive Iodine Therapy     for thyroid  . REVISION TOTAL HIP ARTHROPLASTY Left early 2000s  . TANDEM RING INSERTION  1995   dr Aldean Ast @ [facility]   EUA w/ tandem placement in ovid  (for direct high dose radiation brachytherapy , cervical cancer)  . TOTAL  HIP ARTHROPLASTY Left 1990s  . TRANSTHORACIC ECHOCARDIOGRAM  08/07/2017   mild focal basal hypertrophy of the septum,  ef 24-23%, grade 1 diastolic dysfunction/  atrial septum with lipomatous hypertrophy/  trivial PR     reports that she quit smoking about 37 years ago. Her smoking use included cigarettes. She quit after 5.00 years of use. She has never used smokeless tobacco. She reports that she does not drink alcohol or use drugs.  Allergies  Allergen Reactions  . Codeine Nausea Only    Family History  Problem Relation Age of Onset  . Emphysema Father   . Diabetes Father   . Emphysema Sister   . Emphysema Brother   . Cancer Mother        Skin Cancer  . Diabetes Mother    Prior to Admission medications   Medication Sig Start Date End Date Taking? Authorizing Provider  alendronate (FOSAMAX) 10 MG tablet Take 10 mg by mouth every 7 (seven) days.     [provider]  ALPRAZolam Duanne Moron) 0.25 MG tablet  08/11/19   [provider]  bisacodyl (DULCOLAX) 5 MG EC tablet Take 1 tablet (5 mg total) by mouth daily as needed for moderate constipation. 07/27/19 07/26/20  Donne Hazel, MD  ergocalciferol (VITAMIN D2) 50000 UNITS capsule Take 50,000 Units by mouth once a week. Sundays    [provider]  gabapentin (NEURONTIN) 100 MG capsule Take 300 mg by mouth 2 (two) times daily.     [provider]  gemfibrozil (LOPID) 600 MG tablet Take 600 mg by mouth 2 (two) times daily.      [provider]  insulin aspart (NOVOLOG) 100 UNIT/ML injection Inject 0-8 Units into the skin 3 (three) times daily before meals. Sliding scale    [provider]  insulin degludec (TRESIBA FLEXTOUCH) 100 UNIT/ML SOPN FlexTouch Pen Inject 40 Units into the skin at bedtime.     [provider]  levothyroxine (SYNTHROID) 125 MCG tablet Take 125 mcg by mouth daily before breakfast.  03/01/19   [provider]  metFORMIN (GLUCOPHAGE) 500 MG tablet Take  500 mg by mouth 2 (two) times daily with a meal.    [provider]  methocarbamol (ROBAXIN) 500 MG tablet Take 500 mg by mouth 2 (two) times daily as needed for muscle spasms.  04/22/19   [provider]  oxybutynin (DITROPAN-XL) 10 MG 24 hr tablet Take 10 mg by mouth daily. 07/26/19   [provider]  oxyCODONE ER (XTAMPZA ER) 9 MG C12A Take 9 mg by mouth 2 (two) times daily.    [provider]  polyethylene glycol (MIRALAX) 17 g packet Take 17 g by mouth daily as needed for moderate constipation. 07/27/19   Donne Hazel, MD    Physical Exam: Vitals:   11/29/19 0320 11/29/19 0557 11/29/19 0630 11/29/19 0746  BP: (!) 151/70 (!) 158/60 (!) 181/73 (!) 179/62  Pulse: 86 86 80 76  Resp: 19 16  20   Temp: (!) 100.6 F (38.1 C)     TempSrc: Oral     SpO2: 99% 100% 100% 99%  Weight: 74.8 kg  Height: 5' 1"  (1.549 m)       Constitutional: Febrile, but otherwise NAD, calm, comfortable,  Eyes: PERRL, lids and conjunctivae normal ENMT: Mucous membranes are mildly dry. Posterior pharynx clear of any exudate or lesions.Normal dentition.  Neck: normal, supple, no masses, no thyromegaly Respiratory: Poor inspiratory effort, otherwise clear to auscultation bilaterally, no wheezing, no crackles.  No accessory muscle use.  Cardiovascular: Regular rate and rhythm, no murmurs / rubs / gallops. No extremity edema. 2+ pedal pulses. No carotid bruits.  Abdomen: Nondistended.  Obese.  Soft, positive suprapubic and LLQ tenderness, no guarding, rebound or masses palpated. No hepatosplenomegaly. Bowel sounds positive.  Musculoskeletal: no clubbing / cyanosis. Good ROM, no contractures. Normal muscle tone.  Skin: no rashes, lesions, ulcers on limited dermatological examination. Neurologic: CN 2-12 grossly intact. Sensation intact, DTR normal. Strength 5/5 in all 4.  Psychiatric: Normal judgment and insight. Alert and oriented x 3. Normal mood.   Labs on Admission: I have  personally reviewed following labs and imaging studies  CBC: Recent Labs  Lab 11/29/19 0646  WBC 8.5  NEUTROABS 6.9  HGB 7.2*  HCT 22.9*  MCV 90.2  PLT 381   Basic Metabolic Panel: Recent Labs  Lab 11/29/19 0646  NA 143  K 3.4*  CL 103  CO2 24  GLUCOSE 98  BUN 17  CREATININE 0.97  CALCIUM 9.3   GFR: Estimated Creatinine Clearance: 47.8 mL/min (by C-G formula based on SCr of 0.97 mg/dL). Liver Function Tests: Recent Labs  Lab 11/29/19 0646  AST 23  ALT 12  ALKPHOS 147*  BILITOT 0.5  PROT 7.4  ALBUMIN 3.7   No results for input(s): LIPASE, AMYLASE in the last 168 hours. No results for input(s): AMMONIA in the last 168 hours. Coagulation Profile: No results for input(s): INR, PROTIME in the last 168 hours. Cardiac Enzymes: No results for input(s): CKTOTAL, CKMB, CKMBINDEX, TROPONINI in the last 168 hours. BNP (last 3 results) No results for input(s): PROBNP in the last 8760 hours. HbA1C: No results for input(s): HGBA1C in the last 72 hours. CBG: No results for input(s): GLUCAP in the last 168 hours. Lipid Profile: No results for input(s): CHOL, HDL, LDLCALC, TRIG, CHOLHDL, LDLDIRECT in the last 72 hours. Thyroid Function Tests: No results for input(s): TSH, T4TOTAL, FREET4, T3FREE, THYROIDAB in the last 72 hours. Anemia Panel: No results for input(s): VITAMINB12, FOLATE, FERRITIN, TIBC, IRON, RETICCTPCT in the last 72 hours. Urine analysis:    Component Value Date/Time   COLORURINE YELLOW (A) 11/29/2019 0549   APPEARANCEUR HAZY (A) 11/29/2019 0549   LABSPEC 1.014 11/29/2019 0549   PHURINE 6.0 11/29/2019 0549   GLUCOSEU NEGATIVE 11/29/2019 0549   HGBUR SMALL (A) 11/29/2019 0549   BILIRUBINUR NEGATIVE 11/29/2019 0549   KETONESUR NEGATIVE 11/29/2019 0549   PROTEINUR 30 (A) 11/29/2019 0549   UROBILINOGEN 0.2 05/27/2014 1203   NITRITE NEGATIVE 11/29/2019 0549   LEUKOCYTESUR LARGE (A) 11/29/2019 0549    Radiological Exams on Admission: CT RENAL STONE  STUDY  Result Date: 11/29/2019 CLINICAL DATA:  Flank pain with kidney stone suspected EXAM: CT ABDOMEN AND PELVIS WITHOUT CONTRAST TECHNIQUE: Multidetector CT imaging of the abdomen and pelvis was performed following the standard protocol without IV contrast. COMPARISON:  07/26/2019 and pelvis MRI 06/29/2019 FINDINGS: Lower chest:  No contributory findings. Hepatobiliary: No focal liver abnormality.No evidence of biliary obstruction or stone. Pancreas: Generalized atrophy. Spleen: Unremarkable. Adrenals/Urinary Tract: Negative adrenals. No hydronephrosis or stone. Asymmetric right renal cortical thinning and lobulation attributed to  scarring. Unremarkable bladder. Stomach/Bowel: No obstruction. No appendicitis. Formed stool throughout the colon. Vascular/Lymphatic: No acute vascular abnormality. Atherosclerotic calcification. No mass or adenopathy. Reproductive:Low-density dilatation of the endometrial cavity with coarse calcification at the level of the cervix, hematometra by pelvis MRI. No acute or interval finding. Other: No ascites or pneumoperitoneum. Musculoskeletal: No acute abnormalities. Left hip arthroplasty. Lumbar spine degeneration with L4-5 anterolisthesis. IMPRESSION: 1. No acute finding. 2. Hematometra as evaluated on recent pelvis MRI. 3. Right renal cortical scarring. 4.  Aortic Atherosclerosis (ICD10-I70.0). Electronically Signed   By: Monte Fantasia M.D.   On: 11/29/2019 07:20    EKG: Independently reviewed.   Assessment/Plan Principal Problem:   Acute UTI Admit to telemetry/inpatient. Gentle/time-limited IV hydration. Continue ceftriaxone 1 g IVPB. Follow-up blood culture and sensitivity. Follow-up urine culture and sensitivity.  Active Problems:   Anemia, normocytic  3 g decrease in 3 to 4 months Check stool occult blood. Type and screen. Check anemia panel. Monitor H&H.    Hypomagnesemia Replacement ordered. Follow-up level as needed.     Hypokalemia Replacing. Follow-up potassium level. Magnesium has been supplemented.    Hypothyroidism Continue levothyroxine 125 mcg p.o. daily.    Type 2 diabetes mellitus (HCC) Carbohydrate modified diet. Continue Tresiba 20 units at bedtime. CBG monitoring regular insulin sliding scale.    Hyperlipidemia associated with type 2 diabetes mellitus (HCC) Continue gemfibrozil 600 mg p.o. twice daily.    Essential hypertension Not on medical therapy at this time. Monitor blood pressure. As needed antihypertensives. Should follow-up with PCP to address this.    DVT prophylaxis: SCDs. Code Status: Full code. Family Communication: Disposition Plan: Admit for IV antibiotic therapy for 2 to 3 days. Consults called: Admission status: Inpatient/telemetry.   Reubin Milan MD [facility]  If 7PM-7AM, please contact night-coverage [email or web address]  11/29/2019, 8:02 AM   This document was created using Dragon voice recognition software and may contain some unintended transcription errors.

## 2019-11-29 NOTE — ED Notes (Signed)
Pt provided with lunch tray.

## 2019-11-29 NOTE — ED Notes (Signed)
Attempted report x1. 

## 2019-11-29 NOTE — ED Notes (Signed)
Type and Screen, CBC, CMP recollected and submitted at labs request.

## 2019-11-29 NOTE — ED Notes (Signed)
Unknown how much of antibiotic dose pt received d/t pt removing IV prior to completion.  Admitting provider, Olevia Bowens, made aware.

## 2019-11-29 NOTE — ED Notes (Signed)
Pt provided with bag of personal items dropped off by daughter. Bag placed at bedside, pt informed.

## 2019-11-29 NOTE — ED Notes (Signed)
Family very upset that the patient is having to wait in the lobby due to the patient being in pain, and having to sit and wait. Family states "this is the sorriest hospital, and I will not bring my mother back here again".

## 2019-11-30 ENCOUNTER — Ambulatory Visit: Payer: Medicare HMO | Admitting: Podiatry

## 2019-11-30 LAB — GLUCOSE, CAPILLARY
Glucose-Capillary: 100 mg/dL — ABNORMAL HIGH (ref 70–99)
Glucose-Capillary: 128 mg/dL — ABNORMAL HIGH (ref 70–99)
Glucose-Capillary: 94 mg/dL (ref 70–99)
Glucose-Capillary: 119 mg/dL — ABNORMAL HIGH (ref 70–99)

## 2019-11-30 LAB — CBC WITH DIFFERENTIAL/PLATELET
Abs Immature Granulocytes: 0.01 10*3/uL (ref 0.00–0.07)
Basophils Absolute: 0 10*3/uL (ref 0.0–0.1)
Basophils Relative: 0 %
Eosinophils Absolute: 0 10*3/uL (ref 0.0–0.5)
Eosinophils Relative: 0 %
HCT: 37.9 % (ref 36.0–46.0)
Hemoglobin: 11.8 g/dL — ABNORMAL LOW (ref 12.0–15.0)
Immature Granulocytes: 0 %
Lymphocytes Relative: 24 %
Lymphs Abs: 1.1 10*3/uL (ref 0.7–4.0)
MCH: 28.2 pg (ref 26.0–34.0)
MCHC: 31.1 g/dL (ref 30.0–36.0)
MCV: 90.5 fL (ref 80.0–100.0)
Monocytes Absolute: 0.6 10*3/uL (ref 0.1–1.0)
Monocytes Relative: 14 %
Neutro Abs: 2.8 10*3/uL (ref 1.7–7.7)
Neutrophils Relative %: 62 %
Platelets: 181 10*3/uL (ref 150–400)
RBC: 4.19 MIL/uL (ref 3.87–5.11)
RDW: 13.5 % (ref 11.5–15.5)
WBC: 4.6 10*3/uL (ref 4.0–10.5)
nRBC: 0 % (ref 0.0–0.2)

## 2019-11-30 LAB — BASIC METABOLIC PANEL
Anion gap: 10 (ref 5–15)
BUN: 19 mg/dL (ref 8–23)
CO2: 22 mmol/L (ref 22–32)
Calcium: 8.6 mg/dL — ABNORMAL LOW (ref 8.9–10.3)
Chloride: 110 mmol/L (ref 98–111)
Creatinine, Ser: 0.84 mg/dL (ref 0.44–1.00)
GFR calc Af Amer: >60 mL/min (ref >60)
GFR calc non Af Amer: >60 mL/min (ref >60)
Glucose, Bld: 85 mg/dL (ref 70–99)
Potassium: 4.2 mmol/L (ref 3.5–5.1)
Sodium: 142 mmol/L (ref 135–145)

## 2019-11-30 MED ORDER — OXYCODONE HCL ER 10 MG PO T12A
10.0000 mg | EXTENDED_RELEASE_TABLET | Freq: Two times a day (BID) | ORAL | Status: DC
  Administered 2019-11-30 – 2019-12-02 (×5): 10 mg via ORAL
  Filled 2019-11-30 (×5): qty 1

## 2019-11-30 MED ORDER — SODIUM CHLORIDE 0.9 % IV SOLN
2.0000 g | INTRAVENOUS | Status: DC
  Administered 2019-12-01 – 2019-12-02 (×2): 2 g via INTRAVENOUS
  Filled 2019-11-30 (×2): qty 2

## 2019-11-30 MED ORDER — SODIUM CHLORIDE 0.9 % IV SOLN: 2.0000 g | Freq: Once | INTRAVENOUS | Status: DC

## 2019-11-30 MED ORDER — SODIUM CHLORIDE 0.9 % IV SOLN
1.0000 g | Freq: Once | INTRAVENOUS | Status: AC
  Administered 2019-11-30: 1 g via INTRAVENOUS
  Filled 2019-11-30: qty 1

## 2019-11-30 NOTE — TOC Progression Note (Signed)
Transition of Care [facility]) - Progression Note    Patient Details  Name: [name] MRN: [id number] Date of Birth: [birth year]  [facility] CM/SW Contact  Purcell Mouton, RN Phone Number: 11/30/2019, 4:06 PM  Clinical Narrative:    Pt from home with family. Will continue to follow for discharge needs.         Expected Discharge Plan and Services                                                 Social Determinants of Health (SDOH) Interventions    Readmission Risk Interventions No flowsheet data found.

## 2019-11-30 NOTE — Progress Notes (Addendum)
PHARMACY - PHYSICIAN COMMUNICATION CRITICAL VALUE ALERT - BLOOD CULTURE IDENTIFICATION (BCID)  [name] is an 74 y.o. female who presented to [facility] on 11/29/2019 with a chief complaint of back pain,  UTI  Assessment:  1 of 4 bottles from blood growing GPC - presumed contaminant  Name of physician (or Provider) Contacted: n/a  Current antibiotics: Rocephin 1g  Changes to prescribed antibiotics recommended:  No changes   Kara Mead 11/30/2019  12:06 AM

## 2019-11-30 NOTE — Progress Notes (Signed)
PROGRESS NOTE    [name]  GEX:[id number] DOB: [birth year] DOA: 11/29/2019 PCP: Everardo Beals, NP   Brief Narrative:  [name] is a 74 y.o. female with medical history significant of osteoarthritis, bilateral lower extremity edema, flank pain, history of cervical cancer, chronic bronchitis, hyperthyroidism due to Graves' disease with history of RAI treatment, hypothyroidism, history of pneumonia, type 2 diabetes, hyperlipidemia, history of UTI/sepsis due to Klebsiella pyelonephritis who is coming to the emergency department due to fever, left-sided back pain, dysuria and suprapubic tenderness.  No nausea, vomiting, diarrhea, melena or hematochezia.  Occasional constipation.  She denies headache, sore throat, dyspnea, chest pain, palpitations, dizziness diaphoresis, PND orthopnea.  No polyuria, polydipsia, polyphagia or blurred vision. ED Course: Initial vital signs temperature 100.6 F, pulse 86, respiration 19, blood pressure 151/70 mmHg and O2 sat 99% on room air.  The patient was given 2 g of ceftriaxone IVPB.  Blood/urine culture and sensitivity were sent to the lab prior to antibiotic infusion. Her urinalysis shows small hemoglobinuria, proteinuria 30 mg/dL, large leukocyte esterase, 6-10 RBC, more than 50 WBC and many bacteria on microscopic urine examination.  CBC shows a white count of 8.5, hemoglobin 7.2 g/dL and platelets 284.  Sodium 143, potassium 3.4, chloride 103 and CO2 24 mmol/L.  Renal function and glucose are within expected parameters.  All hepatic functions are within normal range, except for an alkaline phosphatase of 147 units/L.  Imaging: CT renal study did not show any acute finding.  Previously seen hematometra, right renal cortical scarring and aortic stenosis were reported as well.  Please see images for regular report for further detail.   Assessment & Plan:   Clinical problems list 1.  Possible urinary tract infection 2.  Atypical Streptococcus  gallolyticum Bacteremia 3.  Diabetes mellitus type 2 4.  Hypothyroidism 5.  Essential hypertension 6.  Normocytic anemia 7.  Hypokalemia 8.  Hyperlipidemia 9.  COVID-19 virus infection 10.  History of cervical cancer  1.  Urinary tract infection.  Patient presenting with fever, left-sided/flank pain, dysuria and suprapubic tenderness.  Urinalysis was concerning for pyuria, proteinuria and RBC.  Patient has a history of recurrent urinary tract infection.  Concern was for possible complicated urinary tract infection/pyelonephritis.  CT scan was negative for any intra-abdominal pathology except previous old right renal cortical scarring. Patient was started on empiric ceftriaxone 2 g every 12 hourly pending urine culture report. Continue with IV fluid hydration  2.  Atypical Streptococcus gallolyticum bacteremia, noted on 1/2 low blood culture.  This is a gram-positive cocci, concerning for possible GI pathology/neoplasm.  Patient has a history of cervical cancer. Will repeat blood culture We will consult ID and GI along the way Continue with current ceftriaxone 2 g every 24 Follow-up repeat blood culture  3.  Diabetes mellitus type 2 Obtain hemoglobin A1c Sliding scale insulin Continue with basal insulin Hypoglycemic protocol  4.  Hypothyroidism.  Status post RAI therapy with resultant hypothyroidism Continue with levothyroxine  5.  Essential hypertension.  Currently normotensive  6.  Normocytic anemia.  Hemoglobin currently stable Monitor and transfuse if hemoglobin is less than 7.0  7.  Hypokalemia.  Resolved  8.  Hyperlipidemia Continue with gemfibrozil  9.  COVID-19 virus infection.  Clinically stable and not requiring oxygen. Patient was positive on admission for COVID-19 virus infection.  Remote history of previous infection. Continue with supportive therapy with vitamin C, D and zinc  10.  History of cervical cancer.  Stable.  CT scan of  the abdomen and pelvis did not  show any significant lymphadenopathy or metastatic/recurrent disease. Continue to observe    DVT prophylaxis: Lovenox subacute  Code Status: Full code  Family Communication: None Disposition Plan: More than 2 nights  Consultants:  Infectious disease  Procedures: None  Antimicrobials:   11/29/2019-ceftriaxone 2 g every 24 hourly    Subjective: Patient was seen and examined at bedside.  Not in acute distress. Alert and oriented x3.  Denies any significant pain, nausea or vomiting.  Objective: Vitals:   11/29/19 1655 11/29/19 2100 11/30/19 0436 11/30/19 1301  BP: (!) 169/53 134/82 (!) 152/57 (!) 153/68  Pulse: 70 72 65 72  Resp: 20 20 18 19   Temp: 99.7 F (37.6 C) 99.7 F (37.6 C) 98.4 F (36.9 C) (!) 97.1 F (36.2 C)  TempSrc:  Oral    SpO2: 100% 90% 97% 97%  Weight:      Height:        Intake/Output Summary (Last 24 hours) at 11/30/2019 1520 Last data filed at 11/30/2019 1300 Gross per 24 hour  Intake 670 ml  Output 1600 ml  Net -930 ml   Filed Weights   11/29/19 0320  Weight: 74.8 kg    Examination:  General exam: Appears calm and comfortable  Respiratory system: Clear to auscultation. Respiratory effort normal. Cardiovascular system: S1 & S2 heard, RRR. No JVD, murmurs, rubs, gallops or clicks. No pedal edema. Gastrointestinal system: Abdomen is nondistended, soft and nontender. No organomegaly or masses felt.  Normal bowel sounds heard. Central nervous system: Alert and oriented. No focal neurological deficits. Extremities: Symmetric 5 x 5 power. Skin: No rashes, lesions or ulcers Psychiatry: Judgement and insight appear normal. Mood & affect appropriate.     Data Reviewed: I have personally reviewed following labs and imaging studies  CBC: Recent Labs  Lab 11/29/19 0646 11/29/19 1018 11/30/19 0241  WBC 8.5 5.8 4.6  NEUTROABS 6.9 4.4 2.8  HGB 7.2* 11.8* 11.8*  HCT 22.9* 37.1 37.9  MCV 90.2 89.0 90.5  PLT 284 198 132   Basic Metabolic  Panel: Recent Labs  Lab 11/29/19 0646 11/29/19 1018 11/30/19 0241  NA 143 140 142  K 3.4* 3.5 4.2  CL 103 105 110  CO2 24 24 22   GLUCOSE 98 88 85  BUN 17 18 19   CREATININE 0.97 0.91 0.84  CALCIUM 9.3 8.7* 8.6*  MG 1.5* 1.5*  --   PHOS 3.8 3.8  --    GFR: Estimated Creatinine Clearance: 55.2 mL/min (by C-G formula based on SCr of 0.84 mg/dL). Liver Function Tests: Recent Labs  Lab 11/29/19 0646 11/29/19 1018  AST 23 23  ALT 12 12  ALKPHOS 147* 128*  BILITOT 0.5 0.8  PROT 7.4 6.8  ALBUMIN 3.7 3.4*   No results for input(s): LIPASE, AMYLASE in the last 168 hours. No results for input(s): AMMONIA in the last 168 hours. Coagulation Profile: No results for input(s): INR, PROTIME in the last 168 hours. Cardiac Enzymes: No results for input(s): CKTOTAL, CKMB, CKMBINDEX, TROPONINI in the last 168 hours. BNP (last 3 results) No results for input(s): PROBNP in the last 8760 hours. HbA1C: No results for input(s): HGBA1C in the last 72 hours. CBG: Recent Labs  Lab 11/29/19 1139 11/29/19 1641 11/29/19 2055 11/30/19 0731 11/30/19 1201  GLUCAP 78 130* 186* 100* 128*   Lipid Profile: No results for input(s): CHOL, HDL, LDLCALC, TRIG, CHOLHDL, LDLDIRECT in the last 72 hours. Thyroid Function Tests: No results for input(s): TSH, T4TOTAL, FREET4,  T3FREE, THYROIDAB in the last 72 hours. Anemia Panel: Recent Labs    11/29/19 0833  VITAMINB12 390  FOLATE 10.6  FERRITIN 56  TIBC 377  IRON 32   Sepsis Labs: Recent Labs  Lab 11/29/19 0646  LATICACIDVEN 1.4    Recent Results (from the past 240 hour(s))  Urine culture     Status: Abnormal (Preliminary result)   Collection Time: 11/29/19  5:49 AM   Specimen: Urine, Clean Catch  Result Value Ref Range Status   Specimen Description   Final    URINE, CLEAN CATCH Performed at [facility], [place] [address] [place], [place] [address]    Special Requests   Final    NONE Performed at [facility], [place] [address] [place], [place] [address]    Culture (A)  Final    >=100,000 COLONIES/mL KLEBSIELLA PNEUMONIAE SUSCEPTIBILITIES TO FOLLOW Performed at [facility], [place] [address] [place], [place] [address]    Report Status PENDING  Incomplete  Culture, blood (routine x 2)     Status: Abnormal (Preliminary result)   Collection Time: 11/29/19  6:46 AM   Specimen: BLOOD  Result Value Ref Range Status   Specimen Description   Final    BLOOD LEFT ARM Performed at [place] [address] [place], [place] [address]    Special Requests   Final    BOTTLES DRAWN AEROBIC AND ANAEROBIC Blood Culture adequate volume Performed at [place] [address], [place], [place] [address]    Culture  Setup Time   Final    GRAM POSITIVE COCCI IN BOTH AEROBIC AND ANAEROBIC BOTTLES CRITICAL RESULT CALLED TO, READ BACK BY AND VERIFIED WITH: Kela Millin LEGGE [phone]  FCP Performed at [facility], [place] [address], [place], [place] [address]    Culture STREPTOCOCCUS GALLOLYTICUS (A)  Final   Report Status PENDING  Incomplete  Respiratory Panel by RT PCR (Flu A&B, Covid) - Nasopharyngeal Swab     Status: Abnormal   Collection Time: 11/29/19  7:20 AM   Specimen: Nasopharyngeal Swab  Result Value Ref Range Status   SARS Coronavirus 2 by RT PCR POSITIVE (A) NEGATIVE Final    Comment: RESULT CALLED TO, READ BACK BY AND VERIFIED WITH: SIMPSON, K, RN, @1512  ON 11/29/19 BY BILLINGSLEY,L (NOTE) SARS-CoV-2 target nucleic acids are DETECTED. SARS-CoV-2 RNA is generally detectable in upper respiratory specimens  during the acute phase of infection. Positive results are indicative of the presence of the identified virus, but do not rule out bacterial infection or co-infection with other pathogens not detected by the test. Clinical correlation with patient history and other diagnostic information is necessary to determine  patient infection status. The expected result is Negative. Fact Sheet for Patients:  PinkCheek.be Fact Sheet for Healthcare Providers: GravelBags.it This test is not yet approved or cleared by the [place] FDA and  has been authorized for detection and/or diagnosis of SARS-CoV-2 by FDA under an Emergency Use Authorization (EUA).  This EUA will remain in effect (meaning this test  can be used) for the duration of  the COVID-19 declaration under Section 564(b)(1) of the Act, 21 U.S.C. section 360bbb-3(b)(1), unless the authorization is terminated or revoked sooner.    Influenza A by PCR NEGATIVE NEGATIVE Final   Influenza B by PCR NEGATIVE NEGATIVE Final    Comment: (NOTE) The Xpert Xpress SARS-CoV-2/FLU/RSV assay is intended as an aid in  the diagnosis of  influenza from Nasopharyngeal swab specimens and  should not be used as a sole basis for treatment. Nasal washings and  aspirates are unacceptable for Xpert Xpress SARS-CoV-2/FLU/RSV  testing. Fact Sheet for Patients: PinkCheek.be Fact Sheet for Healthcare Providers: GravelBags.it This test is not yet approved or cleared by the [place] FDA and  has been authorized for detection and/or diagnosis of SARS-CoV-2 by  FDA under an Emergency Use Authorization (EUA). This EUA will remain  in effect (meaning this test can be used) for the duration of the  Covid-19 declaration under Section 564(b)(1) of the Act, 21  U.S.C. section 360bbb-3(b)(1), unless the authorization is  terminated or revoked. Performed at [facility], [place] [address] [place], [place] [address]   Culture, blood (routine x 2)     Status: None (Preliminary result)   Collection Time: 11/29/19  8:33 AM   Specimen: BLOOD  Result Value Ref Range Status   Specimen Description   Final    BLOOD RIGHT ARM Performed at [place] [address], [place], [place] [address]    Special Requests   Final    BOTTLES DRAWN AEROBIC AND ANAEROBIC Blood Culture adequate volume Performed at [place] [address] [place], [place] [address]    Culture   Final    NO GROWTH 1 DAY Performed at [facility], [place] [address] [place], [place] [address]    Report Status PENDING  Incomplete         Radiology Studies: CT RENAL STONE STUDY  Result Date: 11/29/2019 CLINICAL DATA:  Flank pain with kidney stone suspected EXAM: CT ABDOMEN AND PELVIS WITHOUT CONTRAST TECHNIQUE: Multidetector CT imaging of the abdomen and pelvis was performed following the standard protocol without IV contrast. COMPARISON:  07/26/2019 and pelvis MRI 06/29/2019 FINDINGS: Lower chest:  No contributory findings. Hepatobiliary: No focal liver abnormality.No evidence of biliary obstruction or stone. Pancreas: Generalized atrophy. Spleen: Unremarkable. Adrenals/Urinary Tract: Negative adrenals. No hydronephrosis or stone. Asymmetric right renal cortical thinning and lobulation attributed to scarring. Unremarkable bladder. Stomach/Bowel: No obstruction. No appendicitis. Formed stool throughout the colon. Vascular/Lymphatic: No acute vascular abnormality. Atherosclerotic calcification. No mass or adenopathy. Reproductive:Low-density dilatation of the endometrial cavity with coarse calcification at the level of the cervix, hematometra by pelvis MRI. No acute or interval finding. Other: No ascites or pneumoperitoneum. Musculoskeletal: No acute abnormalities. Left hip arthroplasty. Lumbar spine degeneration with L4-5 anterolisthesis. IMPRESSION: 1. No acute finding. 2. Hematometra as evaluated on recent pelvis MRI. 3. Right renal cortical scarring. 4.  Aortic Atherosclerosis (ICD10-I70.0). Electronically Signed   By: Monte Fantasia M.D.   On: 11/29/2019 07:20        Scheduled Meds: . gabapentin  300 mg Oral BID  .  gemfibrozil  600 mg Oral BID AC  . insulin aspart  0-9 Units Subcutaneous TID WC  . insulin glargine  20 Units Subcutaneous QHS  . levothyroxine  125 mcg Oral QAC breakfast  . metFORMIN  500 mg Oral BID WC  . oxybutynin  10 mg Oral Daily  . oxyCODONE  10 mg Oral Q12H   Continuous Infusions: . [START ON 12/01/2019] cefTRIAXone (ROCEPHIN)  IV       LOS: 1 day    Time spent: [place], MD [facility] Pager [phone]   If 7PM-7AM, please contact night-coverage [email or web address] Password [facility] 11/30/2019, 3:20 PM

## 2019-11-30 NOTE — Progress Notes (Signed)
Daughter Joelene Millin called and updated

## 2019-12-01 LAB — COMPREHENSIVE METABOLIC PANEL
ALT: 14 U/L (ref 0–44)
AST: 31 U/L (ref 15–41)
Albumin: 3.3 g/dL — ABNORMAL LOW (ref 3.5–5.0)
Alkaline Phosphatase: 116 U/L (ref 38–126)
Anion gap: 14 (ref 5–15)
BUN: 21 mg/dL (ref 8–23)
CO2: 22 mmol/L (ref 22–32)
Calcium: 8.9 mg/dL (ref 8.9–10.3)
Chloride: 102 mmol/L (ref 98–111)
Creatinine, Ser: 1.04 mg/dL — ABNORMAL HIGH (ref 0.44–1.00)
GFR calc Af Amer: >60 mL/min (ref >60)
GFR calc non Af Amer: 53 mL/min — ABNORMAL LOW (ref >60)
Glucose, Bld: 103 mg/dL — ABNORMAL HIGH (ref 70–99)
Potassium: 4.2 mmol/L (ref 3.5–5.1)
Sodium: 138 mmol/L (ref 135–145)
Total Bilirubin: 0.6 mg/dL (ref 0.3–1.2)
Total Protein: 7.3 g/dL (ref 6.5–8.1)

## 2019-12-01 LAB — GLUCOSE, CAPILLARY
Glucose-Capillary: 54 mg/dL — ABNORMAL LOW (ref 70–99)
Glucose-Capillary: 80 mg/dL (ref 70–99)
Glucose-Capillary: 70 mg/dL (ref 70–99)
Glucose-Capillary: 57 mg/dL — ABNORMAL LOW (ref 70–99)
Glucose-Capillary: 77 mg/dL (ref 70–99)
Glucose-Capillary: 127 mg/dL — ABNORMAL HIGH (ref 70–99)

## 2019-12-01 LAB — CBC
HCT: 41.9 % (ref 36.0–46.0)
Hemoglobin: 13.5 g/dL (ref 12.0–15.0)
MCH: 27.8 pg (ref 26.0–34.0)
MCHC: 32.2 g/dL (ref 30.0–36.0)
MCV: 86.2 fL (ref 80.0–100.0)
Platelets: 215 10*3/uL (ref 150–400)
RBC: 4.86 MIL/uL (ref 3.87–5.11)
RDW: 13.4 % (ref 11.5–15.5)
WBC: 4.9 10*3/uL (ref 4.0–10.5)
nRBC: 0 % (ref 0.0–0.2)

## 2019-12-01 LAB — CA 125: Cancer Antigen (CA) 125: 11.7 U/mL (ref 0.0–38.1)

## 2019-12-01 LAB — CULTURE, BLOOD (ROUTINE X 2): Special Requests: ADEQUATE

## 2019-12-01 MED ORDER — TRAZODONE HCL 50 MG PO TABS: 50.0000 mg | ORAL_TABLET | Freq: Every day | ORAL | Status: AC

## 2019-12-01 MED ORDER — ALPRAZOLAM 0.25 MG PO TABS
0.2500 mg | ORAL_TABLET | Freq: Every evening | ORAL | Status: DC | PRN
  Administered 2019-12-02: 0.25 mg via ORAL
  Filled 2019-12-01: qty 1

## 2019-12-01 MED ORDER — ALPRAZOLAM 0.25 MG PO TABS: 0.2500 mg | ORAL_TABLET | Freq: Every day | ORAL | Status: DC

## 2019-12-01 NOTE — Progress Notes (Signed)
NT notified RN of patient's CBG of 54.  RN gave patient 8 ounces of orange juice.  RN rechecked patient's CBG, which had elevated to normal range of 80.

## 2019-12-01 NOTE — Progress Notes (Signed)
During shift change, pt appears more anxious and restless  verbalizing need to get out of bed and feeling "stuck".   Dayshift RN report pt has been more confused today than previous day despite being A/Ox4. This RN  attempt to reorient pt and pt agrees to remain in bed with call light in place. On call [place] made aware, see new orders. Will continue to monitor.

## 2019-12-01 NOTE — Progress Notes (Signed)
PROGRESS NOTE    [name]  XNA:[id number] DOB: 13-Nov-1946 DOA: 11/29/2019 PCP: Everardo Beals, NP   Brief Narrative:  [name] is a 74 y.o. female with medical history significant of osteoarthritis, bilateral lower extremity edema, flank pain, history of cervical cancer, chronic bronchitis, hyperthyroidism due to Graves' disease with history of RAI treatment, hypothyroidism, history of pneumonia, type 2 diabetes, hyperlipidemia, history of UTI/sepsis due to Klebsiella pyelonephritis who is coming to the emergency department due to fever, left-sided back pain, dysuria and suprapubic tenderness.  No nausea, vomiting, diarrhea, melena or hematochezia.  Occasional constipation.  She denies headache, sore throat, dyspnea, chest pain, palpitations, dizziness diaphoresis, PND orthopnea.  No polyuria, polydipsia, polyphagia or blurred vision. ED Course: Initial vital signs temperature 100.6 F, pulse 86, respiration 19, blood pressure 151/70 mmHg and O2 sat 99% on room air.  The patient was given 2 g of ceftriaxone IVPB.  Blood/urine culture and sensitivity were sent to the lab prior to antibiotic infusion. Her urinalysis shows small hemoglobinuria, proteinuria 30 mg/dL, large leukocyte esterase, 6-10 RBC, more than 50 WBC and many bacteria on microscopic urine examination.  CBC shows a white count of 8.5, hemoglobin 7.2 g/dL and platelets 284.  Sodium 143, potassium 3.4, chloride 103 and CO2 24 mmol/L.  Renal function and glucose are within expected parameters.  All hepatic functions are within normal range, except for an alkaline phosphatase of 147 units/L.  Imaging: CT renal study did not show any acute finding.  Previously seen hematometra, right renal cortical scarring and aortic stenosis were reported as well.  Please see images for regular report for further detail.   Assessment & Plan:   Clinical problems list 1.  Possible urinary tract infection 2.  Atypical Streptococcus  gallolyticum Bacteremia 3.  Diabetes mellitus type 2 4.  Hypothyroidism 5.  Essential hypertension 6.  Normocytic anemia 7.  Hypokalemia 8.  Hyperlipidemia 9.  COVID-19 virus infection 10.  History of cervical cancer  1.  Urinary tract infection.  Patient presenting with fever, left-sided/flank pain, dysuria and suprapubic tenderness.  Urinalysis was concerning for pyuria, proteinuria and RBC.  Patient has a history of recurrent urinary tract infection.  Concern was for possible complicated urinary tract infection/pyelonephritis.  CT scan was negative for any intra-abdominal pathology except previous old right renal cortical scarring. Patient was started on empiric ceftriaxone 2 g every 12 hourly pending urine culture report. Continue with IV fluid hydration  2.  Atypical Streptococcus gallolyticum bacteremia, noted on 1/2 low blood culture.  This is a gram-positive cocci, concerning for possible GI pathology/neoplasm.  Patient has a history of cervical cancer. Will repeat blood culture Continue with current ceftriaxone 2 g every 24 Follow-up repeat blood culture prior to discharge  3.  Diabetes mellitus type 2 Obtain hemoglobin A1c Sliding scale insulin Continue with basal insulin Hypoglycemic protocol  4.  Hypothyroidism.  Status post RAI therapy with resultant hypothyroidism Continue with levothyroxine  5.  Essential hypertension.  Currently normotensive  6.  Normocytic anemia.  Hemoglobin currently stable Monitor and transfuse if hemoglobin is less than 7.0  7.  Hypokalemia.  Resolved  8.  Hyperlipidemia Continue with gemfibrozil  9.  COVID-19 virus infection.  Clinically stable and not requiring oxygen. Patient was positive on admission for COVID-19 virus infection.  Remote history of previous infection. Continue with supportive therapy with vitamin C, D and zinc  10.  History of cervical cancer.  Stable.  CT scan of the abdomen and pelvis did not  show any significant  lymphadenopathy or metastatic/recurrent disease. Continue to observe    DVT prophylaxis: Lovenox subacute  Code Status: Full code  Family Communication: None Disposition Plan: Possible discharge home tomorrow    Consultants:  Infectious disease  Procedures: None  Antimicrobials:   11/29/2019-ceftriaxone 2 g every 24 hourly    Subjective: Patient was seen and examined at bedside.  Not in acute distress. Alert and oriented x3.  Denies any significant pain, nausea or vomiting. Patient is anxious to go home.  Advised patient about continued result of repeat blood culture prior to discharge.  Objective: Vitals:   11/30/19 1301 11/30/19 2057 12/01/19 0516 12/01/19 1246  BP: (!) 153/68 (!) 141/63 (!) 156/49 (!) 133/117  Pulse: 72 74 (!) 57 69  Resp: 19 19 17 18   Temp: (!) 97.1 F (36.2 C) 99 F (37.2 C) 97.9 F (36.6 C) 98.3 F (36.8 C)  TempSrc:      SpO2: 97% 96% 100% 97%  Weight:      Height:        Intake/Output Summary (Last 24 hours) at 12/01/2019 1647 Last data filed at 12/01/2019 1249 Gross per 24 hour  Intake 100 ml  Output 800 ml  Net -700 ml   Filed Weights   11/29/19 0320  Weight: 74.8 kg    Examination:  General exam: Appears calm and comfortable  Respiratory system: Clear to auscultation. Respiratory effort normal. Cardiovascular system: S1 & S2 heard, RRR. No JVD, murmurs, rubs, gallops or clicks. No pedal edema. Gastrointestinal system: Abdomen is nondistended, soft and nontender. No organomegaly or masses felt.  Normal bowel sounds heard. Central nervous system: Alert and oriented. No focal neurological deficits. Extremities: Symmetric 5 x 5 power. Skin: No rashes, lesions or ulcers Psychiatry: Judgement and insight appear normal. Mood & affect appropriate.     Data Reviewed: I have personally reviewed following labs and imaging studies  CBC: Recent Labs  Lab 11/29/19 0646 11/29/19 1018 11/30/19 0241 12/01/19 0914  WBC 8.5 5.8 4.6  4.9  NEUTROABS 6.9 4.4 2.8  --   HGB 7.2* 11.8* 11.8* 13.5  HCT 22.9* 37.1 37.9 41.9  MCV 90.2 89.0 90.5 86.2  PLT 284 198 181 093   Basic Metabolic Panel: Recent Labs  Lab 11/29/19 0646 11/29/19 1018 11/30/19 0241 12/01/19 0914  NA 143 140 142 138  K 3.4* 3.5 4.2 4.2  CL 103 105 110 102  CO2 24 24 22 22   GLUCOSE 98 88 85 103*  BUN 17 18 19 21   CREATININE 0.97 0.91 0.84 1.04*  CALCIUM 9.3 8.7* 8.6* 8.9  MG 1.5* 1.5*  --   --   PHOS 3.8 3.8  --   --    GFR: Estimated Creatinine Clearance: 44.6 mL/min (A) (by C-G formula based on SCr of 1.04 mg/dL (H)). Liver Function Tests: Recent Labs  Lab 11/29/19 0646 11/29/19 1018 12/01/19 0914  AST 23 23 31   ALT 12 12 14   ALKPHOS 147* 128* 116  BILITOT 0.5 0.8 0.6  PROT 7.4 6.8 7.3  ALBUMIN 3.7 3.4* 3.3*   No results for input(s): LIPASE, AMYLASE in the last 168 hours. No results for input(s): AMMONIA in the last 168 hours. Coagulation Profile: No results for input(s): INR, PROTIME in the last 168 hours. Cardiac Enzymes: No results for input(s): CKTOTAL, CKMB, CKMBINDEX, TROPONINI in the last 168 hours. BNP (last 3 results) No results for input(s): PROBNP in the last 8760 hours. HbA1C: No results for input(s): HGBA1C in the last  72 hours. CBG: Recent Labs  Lab 11/30/19 1608 11/30/19 2108 12/01/19 0808 12/01/19 0839 12/01/19 1227  GLUCAP 94 119* 54* 80 70   Lipid Profile: No results for input(s): CHOL, HDL, LDLCALC, TRIG, CHOLHDL, LDLDIRECT in the last 72 hours. Thyroid Function Tests: No results for input(s): TSH, T4TOTAL, FREET4, T3FREE, THYROIDAB in the last 72 hours. Anemia Panel: Recent Labs    11/29/19 0833  VITAMINB12 390  FOLATE 10.6  FERRITIN 56  TIBC 377  IRON 32   Sepsis Labs: Recent Labs  Lab 11/29/19 0646  LATICACIDVEN 1.4    Recent Results (from the past 240 hour(s))  Urine culture     Status: Abnormal (Preliminary result)   Collection Time: 11/29/19  5:49 AM   Specimen: Urine, Clean  Catch  Result Value Ref Range Status   Specimen Description   Final    URINE, CLEAN CATCH Performed at [facility], [place] [address], [place], [place] [address]    Special Requests   Final    NONE Performed at [facility], [place] [address] [place], [place] [address]    Culture (A)  Final    >=100,000 COLONIES/mL KLEBSIELLA PNEUMONIAE REPEATING SUSCEPTIBILITY Performed at [facility], [place] [address], [place], [place] [address]    Report Status PENDING  Incomplete  Culture, blood (routine x 2)     Status: Abnormal   Collection Time: 11/29/19  6:46 AM   Specimen: BLOOD  Result Value Ref Range Status   Specimen Description   Final    BLOOD LEFT ARM Performed at [facility] [address], [place], [place] [address]    Special Requests   Final    BOTTLES DRAWN AEROBIC AND ANAEROBIC Blood Culture adequate volume Performed at [facility] [address], [place], [place] [address]    Culture  Setup Time   Final    GRAM POSITIVE COCCI IN BOTH AEROBIC AND ANAEROBIC BOTTLES CRITICAL RESULT CALLED TO, READ BACK BY AND VERIFIED WITH: Kela Millin LEGGE [phone]  FCP Performed at [facility], [place] [address] [place] [address], [place],  [address]    Culture STREPTOCOCCUS GALLOLYTICUS (A)  Final   Report Status 12/01/2019 FINAL  Final   Organism ID, Bacteria STREPTOCOCCUS GALLOLYTICUS  Final      Susceptibility   Streptococcus gallolyticus - MIC*    PENICILLIN <=0.06 SENSITIVE Sensitive     CEFTRIAXONE <=0.12 SENSITIVE Sensitive     ERYTHROMYCIN <=0.12 SENSITIVE Sensitive     LEVOFLOXACIN 2 SENSITIVE Sensitive     VANCOMYCIN 0.25 SENSITIVE Sensitive     * STREPTOCOCCUS GALLOLYTICUS  Respiratory Panel by RT PCR (Flu A&B, Covid) - Nasopharyngeal Swab     Status: Abnormal   Collection Time: 11/29/19  7:20 AM   Specimen: Nasopharyngeal Swab  Result Value Ref Range Status   SARS Coronavirus 2 by RT PCR  POSITIVE (A) NEGATIVE Final    Comment: RESULT CALLED TO, READ BACK BY AND VERIFIED WITH: SIMPSON, K, RN, @1512  ON 11/29/19 BY BILLINGSLEY,L (NOTE) SARS-CoV-2 target nucleic acids are DETECTED. SARS-CoV-2 RNA is generally detectable in upper respiratory specimens  during the acute phase of infection. Positive results are indicative of the presence of the identified virus, but do not rule out bacterial infection or co-infection with other pathogens not detected by the test. Clinical correlation with patient history and other diagnostic information is necessary to determine patient infection status. The expected result is Negative. Fact Sheet for Patients:  PinkCheek.be Fact Sheet for Healthcare Providers: GravelBags.it This test is not yet approved or cleared by the [place] FDA and  has been authorized for detection and/or diagnosis of SARS-CoV-2 by FDA under an Emergency Use Authorization (EUA).  This EUA will remain in effect (meaning this test  can be used) for the duration of  the COVID-19 declaration under Section 564(b)(1) of the Act, 21 U.S.C. section 360bbb-3(b)(1), unless the authorization is terminated or revoked sooner.    Influenza A by PCR NEGATIVE NEGATIVE Final   Influenza B by PCR NEGATIVE NEGATIVE Final    Comment: (NOTE) The Xpert Xpress SARS-CoV-2/FLU/RSV assay is intended as an aid in  the diagnosis of influenza from Nasopharyngeal swab specimens and  should not be used as a sole basis for treatment. Nasal washings and  aspirates are unacceptable for Xpert Xpress SARS-CoV-2/FLU/RSV  testing. Fact Sheet for Patients: PinkCheek.be Fact Sheet for Healthcare Providers: GravelBags.it This test is not yet approved or cleared by the [place] FDA and  has been authorized for detection and/or diagnosis of SARS-CoV-2 by  FDA under an Emergency Use  Authorization (EUA). This EUA will remain  in effect (meaning this test can be used) for the duration of the  Covid-19 declaration under Section 564(b)(1) of the Act, 21  U.S.C. section 360bbb-3(b)(1), unless the authorization is  terminated or revoked. Performed at [facility], [place] [address] [place], [place] [address]   Culture, blood (routine x 2)     Status: None (Preliminary result)   Collection Time: 11/29/19  8:33 AM   Specimen: BLOOD  Result Value Ref Range Status   Specimen Description   Final    BLOOD RIGHT ARM Performed at [facility] [address] [place], [place] [address]    Special Requests   Final    BOTTLES DRAWN AEROBIC AND ANAEROBIC Blood Culture adequate volume Performed at [facility] [address] [place], [place] [address]    Culture   Final    NO GROWTH 2 DAYS Performed at [facility] [address] [place], [place] [address]    Report Status PENDING  Incomplete  Culture, blood (Routine X 2) w Reflex to ID Panel     Status: None (Preliminary result)   Collection Time: 11/30/19  3:48 PM   Specimen: BLOOD  Result Value Ref Range Status   Specimen Description   Final    BLOOD BLOOD LEFT HAND Performed at [facility] [address] [place], [place] [address]    Special Requests   Final    BAA Blood Culture adequate volume Performed at [facility] [address], [place], [place] [address]    Culture   Final    NO GROWTH < 24 HOURS Performed at [place] [address], [place], [place] [address]    Report Status PENDING  Incomplete  Culture, blood (Routine X 2) w Reflex to ID Panel     Status: None (Preliminary result)   Collection Time: 11/30/19  3:48 PM   Specimen: BLOOD  Result Value Ref Range Status   Specimen Description   Final    BLOOD BLOOD RIGHT HAND Performed at [facility] [address], [place], [place] [address]    Special Requests   Final    BAA Blood Culture adequate volume Performed at [facility] [address] [place], [place] [address]    Culture  Final    NO GROWTH < 24 HOURS Performed at [facility], [place] [address], [place], [place] [address]    Report Status PENDING  Incomplete         Radiology Studies: No results found.      Scheduled Meds: . gabapentin  300 mg Oral BID  . gemfibrozil  600 mg Oral BID AC  . insulin aspart  0-9 Units Subcutaneous TID WC  . insulin glargine  20 Units Subcutaneous QHS  . levothyroxine  125 mcg Oral QAC breakfast  . metFORMIN  500 mg Oral BID WC  . oxybutynin  10 mg Oral Daily  . oxyCODONE  10 mg Oral Q12H  . traZODone  50 mg Oral QHS   Continuous Infusions: . cefTRIAXone (ROCEPHIN)  IV 2 g (12/01/19 0510)     LOS: 2 days    Time spent: [place], MD [facility] Pager [phone]   If 7PM-7AM, please contact night-coverage [email or web address] Password [facility] 12/01/2019, 4:47 PM

## 2019-12-01 NOTE — Progress Notes (Signed)
NT notified RN of patient's CBG of 57.  NT gave patient 8 ounces of orange juice.  RN rechecked patient's CBG, which had elevated to normal range of 77.

## 2019-12-02 ENCOUNTER — Inpatient Hospital Stay (HOSPITAL_COMMUNITY): Payer: Medicare HMO

## 2019-12-02 DIAGNOSIS — R7881 Bacteremia: Secondary | ICD-10-CM

## 2019-12-02 LAB — GLUCOSE, CAPILLARY
Glucose-Capillary: 75 mg/dL (ref 70–99)
Glucose-Capillary: 110 mg/dL — ABNORMAL HIGH (ref 70–99)
Glucose-Capillary: 97 mg/dL (ref 70–99)

## 2019-12-02 LAB — URINE CULTURE: Culture: >=100000 — AB

## 2019-12-02 LAB — ECHOCARDIOGRAM LIMITED
Height: 61 in
Weight: 2640 oz

## 2019-12-02 MED ORDER — OXYCODONE HCL ER 10 MG PO T12A: 10.0000 mg | EXTENDED_RELEASE_TABLET | Freq: Two times a day (BID) | ORAL | 0 refills | Status: DC

## 2019-12-02 MED ORDER — AMOXICILLIN-POT CLAVULANATE 875-125 MG PO TABS: 1.0000 | ORAL_TABLET | Freq: Two times a day (BID) | ORAL | 0 refills | Status: AC

## 2019-12-02 MED ORDER — GUAIFENESIN-DM 100-10 MG/5ML PO SYRP: 5.0000 mL | ORAL_SOLUTION | ORAL | 0 refills | Status: DC | PRN

## 2019-12-02 NOTE — Progress Notes (Signed)
AVS given to patient and patient's daughter and explained. Medications and follow up appointments have been explained with pt and the pt's daughter verbalizing understanding.

## 2019-12-02 NOTE — Progress Notes (Signed)
  Echocardiogram 2D Echocardiogram has been performed.  Darlina Sicilian M 12/02/2019, 8:32 AM

## 2019-12-02 NOTE — Discharge Summary (Signed)
Physician Discharge Summary  Patient ID: [name] MRN: [id number] DOB/AGE: 05/07/1946 74 y.o.  Admit date: 11/29/2019 Discharge date: 12/02/2019  Admission Diagnoses: Acute urinary tract infection  Discharge Diagnoses:  Principal Problem:   Acute UTI Active Problems:   Hypothyroidism   Type 2 diabetes mellitus ([place])   Hyperlipidemia associated with type 2 diabetes mellitus ([place])   Essential hypertension   Hypokalemia   Hypomagnesemia   Normocytic anemia   Discharged Condition: Acute UTI COVID-19 virus infection   Hospital Course:  [name] a 74 y.o.femalewith medical history significant ofosteoarthritis, bilateral lower extremity edema, flank pain, history of cervical cancer, chronic bronchitis, hyperthyroidism due to Graves' disease with history of RAItreatment, hypothyroidism, history of pneumonia, type 2 diabetes, hyperlipidemia, history of UTI/sepsis due to Klebsiella pyelonephritiswho is coming to the emergency department due to fever, left-sided back pain, dysuria and suprapubic tenderness. No nausea, vomiting, diarrhea, melena or hematochezia. Occasional constipation. She denies headache, sore throat, dyspnea, chest pain, palpitations, dizziness diaphoresis, PND orthopnea. No polyuria, polydipsia, polyphagia or blurred vision. ED Course:Initial vital signs temperature 100.6 F, pulse 86, respiration 19, blood pressure 151/70 mmHg and O2 sat 99% on room air. The patient was given 2 g of ceftriaxone IVPB. Blood/urine culture and sensitivity were sent to the lab prior to antibiotic infusion. Her urinalysis shows small hemoglobinuria, proteinuria 30 mg/dL, large leukocyte esterase, 6-10 RBC, more than 50 WBC and many bacteria on microscopic urine examination. CBC shows a white count of 8.5, hemoglobin 7.2 g/dL and platelets 284. Sodium 143, potassium 3.4, chloride 103 and CO2 24 mmol/L. Renal function and glucose are within expected parameters. All  hepatic functions are within normal range, except for an alkaline phosphatase of 147 units/L.  Imaging:CT renal study did not show any acute finding. Previously seen hematometra, right renal cortical scarring and aortic stenosis were reported as well. Please see images   Consults: None  Significant Diagnostic Studies: microbiology: blood culture: positive for Streptococcus gallolyticum and 2D echocardiogram showing ejection fraction of 60 to 65% with normal left ventricular systolic function  Treatments: IV antibiotics, remdesivir and dexamethasone with supportive therapy  Discharge Exam: Blood pressure 139/64, pulse 70, temperature 98 F (36.7 C), temperature source Oral, resp. rate 18, height 5' 1"  (1.549 m), weight 74.8 kg, SpO2 96 %.   General appearance: alert and no distress Eyes: conjunctivae/corneas clear. PERRL, EOM's intact. Fundi benign. Resp: clear to auscultation bilaterally Cardio: regular rate and rhythm, S1, S2 normal, no murmur, click, rub or gallop Extremities: extremities normal, atraumatic, no cyanosis or edema Skin: Skin color, texture, turgor normal. No rashes or lesions Neurologic: Alert and oriented X 3, normal strength and tone. Normal symmetric reflexes. Normal coordination and gait  Disposition: Discharge disposition: 01-Home or Self Care       Discharge Instructions    Diet - low sodium heart healthy   Complete by: As directed    Increase activity slowly   Complete by: As directed      Allergies as of 12/02/2019      Reactions   Codeine Nausea Only      Medication List    STOP taking these medications   Xtampza ER 9 MG C12a Generic drug: oxyCODONE ER     TAKE these medications   alendronate 10 MG tablet Commonly known as: FOSAMAX Take 10 mg by mouth every 7 (seven) days.   ALPRAZolam 0.25 MG tablet Commonly known as: XANAX Take 0.25 mg by mouth at bedtime.   amoxicillin-clavulanate 875-125 MG tablet  Commonly known as:  Augmentin Take 1 tablet by mouth 2 (two) times daily for 14 days.   baclofen 10 MG tablet Commonly known as: LIORESAL Take 10 mg by mouth at bedtime.   Dulcolax 5 MG EC tablet Generic drug: bisacodyl Take 1 tablet (5 mg total) by mouth daily as needed for moderate constipation.   ergocalciferol 1.25 MG (50000 UT) capsule Commonly known as: VITAMIN D2 Take 50,000 Units by mouth once a week. Sundays   gabapentin 300 MG capsule Commonly known as: NEURONTIN Take 900 mg by mouth 2 (two) times daily.   gemfibrozil 600 MG tablet Commonly known as: LOPID Take 600 mg by mouth 2 (two) times daily.   guaiFENesin-dextromethorphan 100-10 MG/5ML syrup Commonly known as: ROBITUSSIN DM Take 5 mLs by mouth every 4 (four) hours as needed for cough.   insulin aspart 100 UNIT/ML injection Commonly known as: novoLOG Inject 2-16 Units into the skin 3 (three) times daily before meals. Sliding scale  150-200- 2 units 201-250=3 units 251-300=5 units 301-350 =7 units 351-400 =9 units 401-450=16 units   levothyroxine 125 MCG tablet Commonly known as: SYNTHROID Take 125 mcg by mouth daily before breakfast.   metFORMIN 500 MG tablet Commonly known as: GLUCOPHAGE Take 500 mg by mouth 2 (two) times daily with a meal.   methocarbamol 500 MG tablet Commonly known as: ROBAXIN Take 500 mg by mouth 2 (two) times daily as needed (leg cramps).   oxybutynin 10 MG 24 hr tablet Commonly known as: DITROPAN-XL Take 10 mg by mouth daily.   oxyCODONE 10 mg 12 hr tablet Commonly known as: OXYCONTIN Take 1 tablet (10 mg total) by mouth every 12 (twelve) hours.   polyethylene glycol 17 g packet Commonly known as: MiraLax Take 17 g by mouth daily as needed for moderate constipation.   Tyler Aas FlexTouch 100 UNIT/ML Sopn FlexTouch Pen Generic drug: insulin degludec Inject 40 Units into the skin at bedtime.   vitamin B-12 1000 MCG tablet Commonly known as: CYANOCOBALAMIN Take 1,000 mcg by mouth  daily.      Total time spent on this encounter is 38 minutes  Signed: Elie Confer 12/02/2019, 3:52 PM

## 2019-12-02 NOTE — Evaluation (Signed)
Occupational Therapy Evaluation Patient Details Name: [name] MRN: [id number] DOB: 07/12/1946 Today's Date: 12/02/2019    History of Present Illness 74 year old female was admitted for back pain and dxd with uti.  + COVID.  PMH:  OA, LE edema, Graves disease, DM, utis   Clinical Impression   Pt was admitted for the above.  She states that she lives with her husband and is mod I for basic adls and cooking with the exception of her husband donning L sock.  Pt needed min A for bed mobility and to stand from the bed.  She needed min guard for standing with RW from 3:1 commode.  Increased assistance is needed for toileting and LB adls due to fatique.  Sats remained at 90% on RA, but she did have 2/4 dyspnea with activity. Will follow in acute setting with the goals listed below.     Follow Up Recommendations  Supervision/Assistance - 24 hour;Home health OT(as long as family can provide min A lifting assistance  and  Max A for LB adls and toilet hygiene. ; if not, pt would benefit from SNF)    Equipment Recommendations  3 in 1 bedside commode    Recommendations for Other Services       Precautions / Restrictions Precautions Precautions: Fall Restrictions Weight Bearing Restrictions: No      Mobility Bed Mobility               General bed mobility comments: min A for OOB  Transfers Overall transfer level: Needs assistance Equipment used: Rolling walker (2 wheeled);None Transfers: Sit to/from [employer] to Stand: Min assist Stand pivot transfers: Min assist;Min guard       General transfer comment: min guard for safety with RW; min A without this    Balance                                           ADL either performed or assessed with clinical judgement   ADL Overall ADL's : Needs assistance/impaired Eating/Feeding: Independent   Grooming: Set up   Upper Body Bathing: Set up   Lower Body Bathing: Moderate  assistance   Upper Body Dressing : Set up   Lower Body Dressing: Moderate assistance   Toilet Transfer: Minimal assistance;Stand-pivot;BSC   Toileting- Clothing Manipulation and Hygiene: Maximal assistance         General ADL Comments: used toilet:  performed SPT without RW then used for transfer to chair.  Gilford Rile is too Scientist, clinical (histocompatibility and immunogenetics)      Pertinent Vitals/Pain Pain Assessment: No/denies pain     Hand Dominance     Extremity/Trunk Assessment Upper Extremity Assessment Upper Extremity Assessment: Generalized weakness           Communication Communication Communication: No difficulties   Cognition Arousal/Alertness: Awake/alert Behavior During Therapy: WFL for tasks assessed/performed Overall Cognitive Status: No family/caregiver present to determine baseline cognitive functioning                                     General Comments  sats in 90s on RA; 2/4 dyspnea    Exercises     Shoulder Instructions  Home Living Family/patient expects to be discharged to:: Private residence Living Arrangements: Children;Spouse/significant other   Type of Home: House Home Access: Stairs to enter Technical brewer of Steps: 5         Bathroom Shower/Tub: Occupational psychologist: Handicapped height     Home Equipment: Kasandra Knudsen - single point(doesn't know where walker is)   Additional Comments: need to verify info:  different from last time seen in therapy 6/19      Prior Functioning/Environment Level of Independence: Independent with assistive device(s)(except assist for L sock)                 OT Problem List: Decreased strength;Decreased activity tolerance;Impaired balance (sitting and/or standing);Decreased knowledge of use of DME or AE      OT Treatment/Interventions: Self-care/ADL training;Energy conservation;DME and/or AE instruction;Patient/family education;Balance  training;Therapeutic activities    OT Goals(Current goals can be found in the care plan section) Acute Rehab OT Goals Patient Stated Goal: none stated; agreeable and happy to be OOB OT Goal Formulation: With patient Time For Goal Achievement: 12/16/19 Potential to Achieve Goals: Good ADL Goals Pt Will Perform Grooming: with supervision;standing Pt Will Transfer to Toilet: with supervision;ambulating;regular height toilet(3:1 over) Pt Will Perform Toileting - Clothing Manipulation and hygiene: with supervision;sit to/from stand Additional ADL Goal #1: pt will complete UB adl with set up and LB adl with min A incorporating at least 3 rest breaks for energy conservation  OT Frequency: Min 2X/week   Barriers to D/C:            Co-evaluation              AM-PAC OT "6 Clicks" Daily Activity     Outcome Measure Help from another person eating meals?: None Help from another person taking care of personal grooming?: A Little Help from another person toileting, which includes using toliet, bedpan, or urinal?: A Lot Help from another person bathing (including washing, rinsing, drying)?: A Lot Help from another person to put on and taking off regular upper body clothing?: A Little Help from another person to put on and taking off regular lower body clothing?: A Lot 6 Click Score: 16   End of Session    Activity Tolerance: Patient tolerated treatment well Patient left: in chair;with call bell/phone within reach;with chair alarm set  OT Visit Diagnosis: Unsteadiness on feet (R26.81);Muscle weakness (generalized) (M62.81)                Time: 6606-3016 OT Time Calculation (min): 28 min Charges:  OT General Charges $OT Visit: 1 Visit OT Evaluation $OT Eval Low Complexity: 1 Low OT Treatments $Self Care/Home Management : 8-22 mins  [name] S, OTR/L Acute Rehabilitation Services 12/02/2019  [name] 12/02/2019, 3:34 PM

## 2019-12-04 LAB — CULTURE, BLOOD (ROUTINE X 2)
Culture: NO GROWTH
Special Requests: ADEQUATE

## 2019-12-05 LAB — CULTURE, BLOOD (ROUTINE X 2)
Culture: NO GROWTH
Culture: NO GROWTH
Special Requests: ADEQUATE
Special Requests: ADEQUATE

## 2019-12-12 ENCOUNTER — Emergency Department (HOSPITAL_COMMUNITY): Payer: Medicare HMO

## 2019-12-12 ENCOUNTER — Other Ambulatory Visit: Payer: Self-pay

## 2019-12-12 ENCOUNTER — Inpatient Hospital Stay (HOSPITAL_COMMUNITY)
Admission: EM | Admit: 2019-12-12 | Discharge: 2019-12-15 | DRG: 064 | Disposition: A | Discharge status: Home Health | Payer: Medicare HMO | Attending: Internal Medicine | Admitting: Internal Medicine

## 2019-12-12 ENCOUNTER — Encounter (HOSPITAL_COMMUNITY): Payer: Self-pay | Admitting: Emergency Medicine

## 2019-12-12 DIAGNOSIS — K5090 Crohn's disease, unspecified, without complications: Secondary | ICD-10-CM | POA: Diagnosis present

## 2019-12-12 DIAGNOSIS — I6381 Other cerebral infarction due to occlusion or stenosis of small artery: Secondary | ICD-10-CM | POA: Diagnosis not present

## 2019-12-12 DIAGNOSIS — Z8701 Personal history of pneumonia (recurrent): Secondary | ICD-10-CM

## 2019-12-12 DIAGNOSIS — I10 Essential (primary) hypertension: Secondary | ICD-10-CM | POA: Diagnosis present

## 2019-12-12 DIAGNOSIS — Z87891 Personal history of nicotine dependence: Secondary | ICD-10-CM | POA: Diagnosis not present

## 2019-12-12 DIAGNOSIS — E162 Hypoglycemia, unspecified: Secondary | ICD-10-CM | POA: Diagnosis not present

## 2019-12-12 DIAGNOSIS — R4182 Altered mental status, unspecified: Secondary | ICD-10-CM | POA: Diagnosis not present

## 2019-12-12 DIAGNOSIS — I6523 Occlusion and stenosis of bilateral carotid arteries: Secondary | ICD-10-CM | POA: Diagnosis present

## 2019-12-12 DIAGNOSIS — Z794 Long term (current) use of insulin: Secondary | ICD-10-CM | POA: Diagnosis not present

## 2019-12-12 DIAGNOSIS — R29707 NIHSS score 7: Secondary | ICD-10-CM | POA: Diagnosis present

## 2019-12-12 DIAGNOSIS — Z7983 Long term (current) use of bisphosphonates: Secondary | ICD-10-CM

## 2019-12-12 DIAGNOSIS — Z8541 Personal history of malignant neoplasm of cervix uteri: Secondary | ICD-10-CM | POA: Diagnosis not present

## 2019-12-12 DIAGNOSIS — E1169 Type 2 diabetes mellitus with other specified complication: Secondary | ICD-10-CM | POA: Diagnosis not present

## 2019-12-12 DIAGNOSIS — G8191 Hemiplegia, unspecified affecting right dominant side: Secondary | ICD-10-CM | POA: Diagnosis present

## 2019-12-12 DIAGNOSIS — Z79890 Hormone replacement therapy (postmenopausal): Secondary | ICD-10-CM

## 2019-12-12 DIAGNOSIS — N12 Tubulo-interstitial nephritis, not specified as acute or chronic: Secondary | ICD-10-CM | POA: Diagnosis present

## 2019-12-12 DIAGNOSIS — E11649 Type 2 diabetes mellitus with hypoglycemia without coma: Secondary | ICD-10-CM | POA: Diagnosis present

## 2019-12-12 DIAGNOSIS — I6389 Other cerebral infarction: Secondary | ICD-10-CM | POA: Diagnosis not present

## 2019-12-12 DIAGNOSIS — I63312 Cerebral infarction due to thrombosis of left middle cerebral artery: Secondary | ICD-10-CM

## 2019-12-12 DIAGNOSIS — E785 Hyperlipidemia, unspecified: Secondary | ICD-10-CM | POA: Diagnosis present

## 2019-12-12 DIAGNOSIS — Z79899 Other long term (current) drug therapy: Secondary | ICD-10-CM | POA: Diagnosis not present

## 2019-12-12 DIAGNOSIS — I6521 Occlusion and stenosis of right carotid artery: Secondary | ICD-10-CM | POA: Diagnosis not present

## 2019-12-12 DIAGNOSIS — Z885 Allergy status to narcotic agent status: Secondary | ICD-10-CM | POA: Diagnosis not present

## 2019-12-12 DIAGNOSIS — M1990 Unspecified osteoarthritis, unspecified site: Secondary | ICD-10-CM | POA: Diagnosis present

## 2019-12-12 DIAGNOSIS — J439 Emphysema, unspecified: Secondary | ICD-10-CM | POA: Diagnosis present

## 2019-12-12 DIAGNOSIS — G9349 Other encephalopathy: Secondary | ICD-10-CM | POA: Diagnosis present

## 2019-12-12 DIAGNOSIS — E1159 Type 2 diabetes mellitus with other circulatory complications: Secondary | ICD-10-CM | POA: Diagnosis not present

## 2019-12-12 DIAGNOSIS — E1369 Other specified diabetes mellitus with other specified complication: Secondary | ICD-10-CM | POA: Diagnosis present

## 2019-12-12 DIAGNOSIS — E119 Type 2 diabetes mellitus without complications: Secondary | ICD-10-CM

## 2019-12-12 DIAGNOSIS — M778 Other enthesopathies, not elsewhere classified: Secondary | ICD-10-CM | POA: Diagnosis present

## 2019-12-12 DIAGNOSIS — E039 Hypothyroidism, unspecified: Secondary | ICD-10-CM | POA: Diagnosis present

## 2019-12-12 DIAGNOSIS — E890 Postprocedural hypothyroidism: Secondary | ICD-10-CM | POA: Diagnosis present

## 2019-12-12 DIAGNOSIS — M7989 Other specified soft tissue disorders: Secondary | ICD-10-CM | POA: Diagnosis not present

## 2019-12-12 DIAGNOSIS — J438 Other emphysema: Secondary | ICD-10-CM | POA: Diagnosis present

## 2019-12-12 DIAGNOSIS — U071 COVID-19: Secondary | ICD-10-CM | POA: Diagnosis present

## 2019-12-12 DIAGNOSIS — Z825 Family history of asthma and other chronic lower respiratory diseases: Secondary | ICD-10-CM

## 2019-12-12 LAB — CBC WITH DIFFERENTIAL/PLATELET
Abs Immature Granulocytes: 0.04 10*3/uL (ref 0.00–0.07)
Basophils Absolute: 0 10*3/uL (ref 0.0–0.1)
Basophils Relative: 0 %
Eosinophils Absolute: 0 10*3/uL (ref 0.0–0.5)
Eosinophils Relative: 0 %
HCT: 35.8 % — ABNORMAL LOW (ref 36.0–46.0)
Hemoglobin: 11.7 g/dL — ABNORMAL LOW (ref 12.0–15.0)
Immature Granulocytes: 1 %
Lymphocytes Relative: 10 %
Lymphs Abs: 0.7 10*3/uL (ref 0.7–4.0)
MCH: 28 pg (ref 26.0–34.0)
MCHC: 32.7 g/dL (ref 30.0–36.0)
MCV: 85.6 fL (ref 80.0–100.0)
Monocytes Absolute: 0.7 10*3/uL (ref 0.1–1.0)
Monocytes Relative: 9 %
Neutro Abs: 6.3 10*3/uL (ref 1.7–7.7)
Neutrophils Relative %: 80 %
Platelets: 292 10*3/uL (ref 150–400)
RBC: 4.18 MIL/uL (ref 3.87–5.11)
RDW: 13.3 % (ref 11.5–15.5)
WBC: 7.8 10*3/uL (ref 4.0–10.5)
nRBC: 0 % (ref 0.0–0.2)

## 2019-12-12 LAB — COMPREHENSIVE METABOLIC PANEL
ALT: 18 U/L (ref 0–44)
AST: 26 U/L (ref 15–41)
Albumin: 2.6 g/dL — ABNORMAL LOW (ref 3.5–5.0)
Alkaline Phosphatase: 161 U/L — ABNORMAL HIGH (ref 38–126)
Anion gap: 14 (ref 5–15)
BUN: 11 mg/dL (ref 8–23)
CO2: 20 mmol/L — ABNORMAL LOW (ref 22–32)
Calcium: 8.8 mg/dL — ABNORMAL LOW (ref 8.9–10.3)
Chloride: 106 mmol/L (ref 98–111)
Creatinine, Ser: 0.73 mg/dL (ref 0.44–1.00)
GFR calc Af Amer: >60 mL/min (ref >60)
GFR calc non Af Amer: >60 mL/min (ref >60)
Glucose, Bld: 42 mg/dL — CL (ref 70–99)
Potassium: 3.2 mmol/L — ABNORMAL LOW (ref 3.5–5.1)
Sodium: 140 mmol/L (ref 135–145)
Total Bilirubin: 0.5 mg/dL (ref 0.3–1.2)
Total Protein: 6.7 g/dL (ref 6.5–8.1)

## 2019-12-12 LAB — URINALYSIS, ROUTINE W REFLEX MICROSCOPIC
Bilirubin Urine: NEGATIVE
Glucose, UA: 50 mg/dL — AB
Hgb urine dipstick: NEGATIVE
Ketones, ur: NEGATIVE mg/dL
Leukocytes,Ua: NEGATIVE
Nitrite: NEGATIVE
Protein, ur: 30 mg/dL — AB
Specific Gravity, Urine: 1.019 (ref 1.005–1.030)
pH: 5 (ref 5.0–8.0)

## 2019-12-12 LAB — CBG MONITORING, ED
Glucose-Capillary: 31 mg/dL — CL (ref 70–99)
Glucose-Capillary: 93 mg/dL (ref 70–99)
Glucose-Capillary: 80 mg/dL (ref 70–99)
Glucose-Capillary: 49 mg/dL — ABNORMAL LOW (ref 70–99)
Glucose-Capillary: 183 mg/dL — ABNORMAL HIGH (ref 70–99)

## 2019-12-12 LAB — LACTIC ACID, PLASMA: Lactic Acid, Venous: 1.9 mmol/L (ref 0.5–1.9)

## 2019-12-12 LAB — GLUCOSE, RANDOM: Glucose, Bld: 134 mg/dL — ABNORMAL HIGH (ref 70–99)

## 2019-12-12 MED ORDER — LEVOTHYROXINE SODIUM 25 MCG PO TABS
125.0000 ug | ORAL_TABLET | Freq: Every day | ORAL | Status: DC
  Administered 2019-12-13 – 2019-12-14 (×2): 125 ug via ORAL
  Filled 2019-12-12 (×4): qty 1

## 2019-12-12 MED ORDER — GABAPENTIN 300 MG PO CAPS
900.0000 mg | ORAL_CAPSULE | Freq: Two times a day (BID) | ORAL | Status: DC
  Administered 2019-12-12 – 2019-12-15 (×6): 900 mg via ORAL
  Filled 2019-12-12 (×6): qty 3

## 2019-12-12 MED ORDER — ENOXAPARIN SODIUM 40 MG/0.4ML ~~LOC~~ SOLN
40.0000 mg | SUBCUTANEOUS | Status: DC
  Administered 2019-12-13 – 2019-12-15 (×3): 40 mg via SUBCUTANEOUS
  Filled 2019-12-12 (×2): qty 0.4

## 2019-12-12 MED ORDER — BACLOFEN 10 MG PO TABS
10.0000 mg | ORAL_TABLET | Freq: Four times a day (QID) | ORAL | Status: DC | PRN
  Administered 2019-12-12: 10 mg via ORAL
  Filled 2019-12-12: qty 1

## 2019-12-12 MED ORDER — DEXTROSE 50 % IV SOLN
1.0000 | Freq: Once | INTRAVENOUS | Status: AC
  Administered 2019-12-12: 50 mL via INTRAVENOUS
  Filled 2019-12-12: qty 50

## 2019-12-12 MED ORDER — DEXTROSE 50 % IV SOLN
25.0000 g | Freq: Once | INTRAVENOUS | Status: AC
  Administered 2019-12-12: 25 g via INTRAVENOUS

## 2019-12-12 MED ORDER — SODIUM CHLORIDE 0.9 % IV BOLUS
1000.0000 mL | Freq: Once | INTRAVENOUS | Status: AC
  Administered 2019-12-12: 1000 mL via INTRAVENOUS

## 2019-12-12 MED ORDER — OXYBUTYNIN CHLORIDE ER 10 MG PO TB24
10.0000 mg | ORAL_TABLET | Freq: Every day | ORAL | Status: DC
  Administered 2019-12-12 – 2019-12-14 (×3): 10 mg via ORAL
  Filled 2019-12-12 (×4): qty 1

## 2019-12-12 MED ORDER — IOHEXOL 300 MG/ML  SOLN
100.0000 mL | Freq: Once | INTRAMUSCULAR | Status: AC | PRN
  Administered 2019-12-12: 100 mL via INTRAVENOUS

## 2019-12-12 MED ORDER — GEMFIBROZIL 600 MG PO TABS
600.0000 mg | ORAL_TABLET | Freq: Two times a day (BID) | ORAL | Status: DC
  Administered 2019-12-12 – 2019-12-15 (×6): 600 mg via ORAL
  Filled 2019-12-12 (×7): qty 1

## 2019-12-12 MED ORDER — DEXTROSE 50 % IV SOLN
INTRAVENOUS | Status: AC
  Filled 2019-12-12: qty 50

## 2019-12-12 MED ORDER — SODIUM CHLORIDE 0.45 % IV SOLN: INTRAVENOUS | Status: DC

## 2019-12-12 MED ORDER — AMOXICILLIN-POT CLAVULANATE 875-125 MG PO TABS
1.0000 | ORAL_TABLET | Freq: Two times a day (BID) | ORAL | Status: DC
  Administered 2019-12-12 – 2019-12-15 (×6): 1 via ORAL
  Filled 2019-12-12 (×6): qty 1

## 2019-12-12 MED ORDER — ACETAMINOPHEN 500 MG PO TABS
500.0000 mg | ORAL_TABLET | Freq: Four times a day (QID) | ORAL | Status: DC | PRN
  Administered 2019-12-12 – 2019-12-13 (×2): 500 mg via ORAL
  Filled 2019-12-12 (×2): qty 1

## 2019-12-12 MED ORDER — METHOCARBAMOL 500 MG PO TABS
500.0000 mg | ORAL_TABLET | Freq: Two times a day (BID) | ORAL | Status: DC
  Administered 2019-12-12 – 2019-12-15 (×6): 500 mg via ORAL
  Filled 2019-12-12 (×6): qty 1

## 2019-12-12 NOTE — ED Triage Notes (Signed)
Pt arrives to ED from home where she lives with family with complaints of altered mental status and increased lethargy and weakness x1 week. Patient was recently admitted for UTI and COVID buy discharged home. Patient only alert to self at this time.

## 2019-12-12 NOTE — ED Notes (Signed)
Attempted to call report; nurse is still in report with day shift nurse. Will attempt again in a few minutes.

## 2019-12-12 NOTE — ED Notes (Signed)
Family requesting pain medication for pt's cramping legs. Requested Baclofen from pharmacy.

## 2019-12-12 NOTE — H&P (Signed)
History and Physical    [name] UXN:[id number] DOB: 04-11-1946 DOA: 12/12/2019  PCP: Everardo Beals, NP (Confirm with patient/family/NH records and if not entered, this has to be entered at [facility] point of entry) Patient coming from: coming from home  I have personally briefly reviewed patient's old medical records in [place]  Chief Complaint: increased confusion and weakness  HPI: [name] is a 74 y.o. female with medical history of  insulin-dependent type 2 diabetes, hypertension, COPD, Crohn's disease, presenting to the emergency department from home via EMS with altered mental status.  Patient was recently admitted to the hospital on 11/29/2019 and discharged on 12/02/2019 for pyelonephritis.  She was also noted to be Covid positive incidentally though asymptomatic and was not treated.  She was discharged on 2-week course of Augmentin.  Per EMS, patient's family reports she has had gradually worsening fatigue/weakness and confusion over the last few days.  She is normally alert and oriented x3 though has been only alert to self over the last few days.  No fevers, cough, shortness of breath.  Patient is not able to provide history secondary to altered mental status. Daughter has been monitoring blood sugars which have been low. She also confirms patient has had increased weakness but is able to get out of bed with assistance and ambulate with walker to bathroom. She has a bone spur right elbow to which daughter attributed right UE weakness.  Level 5 caveat. (For level 3, the HPI must include 4+ descriptors: Location, Quality, Severity, Duration, Timing, Context, modifying factors, associated signs/symptoms and/or status of 3+ chronic problems.)  (Please avoid self-populating past medical history here) (The initial 2-3 lines should be focused and good to copy and paste in the HPI section of the daily progress note).  ED Course: Hemodynamically stable. Lab revealed serum  glucose 42. CT abdomen negative. CT head with lacunar infarct left basal ganglia - new since 2015. Glucose replacement started. MRI brain ordered. Referred to [facility] for admission for management of hypoglycemia and weakness.  Review of Systems: As per HPI otherwise 10 point review of systems negative.  Unacceptable ROS statements: "10 systems reviewed," "Extensive" (without elaboration).  Acceptable ROS statements: "All others negative," "All others reviewed and are negative," and "All others unremarkable," with at [place] documented Can't double dip - if using for HPI can't use for ROS  Past Medical History:  Diagnosis Date  . Arthritis   . Bilateral lower extremity edema   . Flank pain   . Full dentures   . History of cervical cancer    11/ 1994  Stage IB  s/p  high dose radiation brachytherapy @ [facility] 01/ 1995--- per pt no recurrence  . History of chronic bronchitis   . History of hyperthyroidism    d 03/ 2011---due to grave's disease--- s/p  RAI treatment 04/ 2011  . History of sepsis 05/03/2018   due to UTI with klebsiella/ pyelonephritis/ ureteral obstruction cause by stone  . Hyperlipidemia   . Hypothyroidism, postradioiodine therapy    endocrinologist--  dr Loanne Drilling--  dx graves disease and s/p RAI i131 treatement 4/ 2011  . Nauseated   . Oral thrush 05/03/2018  . Pneumonia   . Type 2 diabetes mellitus treated with insulin ([place])    FOLLOWED BY PCP  . Urgency of urination   . Wears glasses     Past Surgical History:  Procedure Laterality Date  . APPENDECTOMY    . CATARACT EXTRACTION W/ INTRAOCULAR  LENS  IMPLANT, BILATERAL  2004  approx.  . CYSTOSCOPY WITH STENT PLACEMENT Right 05/04/2018   Procedure: CYSTOSCOPY WITH STENT PLACEMENT AND RETROGRADE PYELOGRAM;  Surgeon: Ceasar Mons, MD;  Location: [facility];  Service: Urology;  Laterality: Right;  . CYSTOSCOPY/URETEROSCOPY/HOLMIUM LASER/STENT PLACEMENT Right 05/19/2018   Procedure: CYSTOSCOPY, URETEROSCOPY/HOLMIUM  LASER, STONE BASKETRY/ STENT EXCHANGE;  Surgeon: Ceasar Mons, MD;  Location: [facility];  Service: Urology;  Laterality: Right;  . EXCISION MASS LEFT CHEST WALL  09-20-2007   dr Grandville Silos  [facility]  . Radioactive Iodine Therapy     for thyroid  . REVISION TOTAL HIP ARTHROPLASTY Left early 2000s  . TANDEM RING INSERTION  1995   dr Aldean Ast @ [facility]   EUA w/ tandem placement in ovid  (for direct high dose radiation brachytherapy , cervical cancer)  . TOTAL HIP ARTHROPLASTY Left 1990s  . TRANSTHORACIC ECHOCARDIOGRAM  08/07/2017   mild focal basal hypertrophy of the septum,  ef 10-27%, grade 1 diastolic dysfunction/  atrial septum with lipomatous hypertrophy/  trivial PR     reports that she quit smoking about 37 years ago. Her smoking use included cigarettes. She quit after 5.00 years of use. She has never used smokeless tobacco. She reports that she does not drink alcohol or use drugs.  Allergies  Allergen Reactions  . Codeine Nausea Only    Family History  Problem Relation Age of Onset  . Emphysema Father   . Diabetes Father   . Emphysema Sister   . Emphysema Brother   . Cancer Mother        Skin Cancer  . Diabetes Mother   soc hx - married. Husband just out of hospital after having a stroke. He was covid positive and is completing remdesivir as outpatient. Daughter is very attentive and assists with meds and care.  Unacceptable: Noncontributory, unremarkable, or negative. Acceptable: Family history reviewed and not pertinent (If you reviewed it)  Prior to Admission medications   Medication Sig Start Date End Date Taking? Authorizing Provider  acetaminophen (TYLENOL) 500 MG tablet Take 500 mg by mouth every 6 (six) hours as needed (leg pain).   Yes [provider]  ALPRAZolam (XANAX) 0.25 MG tablet Take 0.25 mg by mouth at bedtime.  08/11/19  Yes [provider]  amoxicillin-clavulanate (AUGMENTIN) 875-125 MG tablet Take 1 tablet by  mouth 2 (two) times daily for 14 days. 12/02/19 12/16/19 Yes Elie Confer, MD  baclofen (LIORESAL) 10 MG tablet Take 10 mg by mouth 4 (four) times daily as needed (leg cramps).    Yes [provider]  ergocalciferol (VITAMIN D2) 50000 UNITS capsule Take 50,000 Units by mouth every Sunday.    Yes [provider]  gabapentin (NEURONTIN) 300 MG capsule Take 900 mg by mouth 2 (two) times daily.    Yes [provider]  gemfibrozil (LOPID) 600 MG tablet Take 600 mg by mouth 2 (two) times daily.     Yes [provider]  guaiFENesin-dextromethorphan (ROBITUSSIN DM) 100-10 MG/5ML syrup Take 5 mLs by mouth every 4 (four) hours as needed for cough. 12/02/19  Yes Elie Confer, MD  insulin degludec (TRESIBA FLEXTOUCH) 100 UNIT/ML SOPN FlexTouch Pen Inject 40 Units into the skin at bedtime.   Yes [provider]  levothyroxine (SYNTHROID) 125 MCG tablet Take 125 mcg by mouth daily before breakfast.  03/01/19  Yes [provider]  metFORMIN (GLUCOPHAGE) 500 MG tablet Take 500 mg by mouth 2 (two) times daily  with a meal.   Yes [provider]  methocarbamol (ROBAXIN) 500 MG tablet Take 500 mg by mouth 2 (two) times daily.  04/22/19  Yes [provider]  oxybutynin (DITROPAN-XL) 10 MG 24 hr tablet Take 10 mg by mouth at bedtime.  07/26/19  Yes [provider]  oxyCODONE ER (XTAMPZA ER) 9 MG C12A Take 9 mg by mouth every 12 (twelve) hours.   Yes [provider]  vitamin B-12 (CYANOCOBALAMIN) 1000 MCG tablet Take 1,000 mcg by mouth daily.   Yes [provider]  bisacodyl (DULCOLAX) 5 MG EC tablet Take 1 tablet (5 mg total) by mouth daily as needed for moderate constipation. Patient not taking: Reported on 12/12/2019 07/27/19 07/26/20  Donne Hazel, MD  oxyCODONE (OXYCONTIN) 10 mg 12 hr tablet Take 1 tablet (10 mg total) by mouth every 12 (twelve) hours. Patient not taking: Reported on 12/12/2019 12/02/19   Elie Confer, MD  polyethylene glycol (MIRALAX) 17 g packet Take 17 g by mouth daily as needed for moderate constipation. Patient not taking: Reported on 12/12/2019 07/27/19   Donne Hazel, MD    Physical Exam: Vitals:   12/12/19 1545 12/12/19 1615 12/12/19 1746 12/12/19 1751  BP:   (!) 133/45   Pulse: 71 83  66  Resp: 16 15  18   Temp:      TempSrc:      SpO2: 98% 99%  99%    Constitutional: NAD, calm, comfortable Vitals:   12/12/19 1545 12/12/19 1615 12/12/19 1746 12/12/19 1751  BP:   (!) 133/45   Pulse: 71 83  66  Resp: 16 15  18   Temp:      TempSrc:      SpO2: 98% 99%  99%   General:  Obese woman in no distress. Slow to follow commands. Eyes: PERRL, lids and conjunctivae normal ENMT: Mucous membranes are moist. Posterior pharynx clear of any exudate or lesions. Neck: obese, normal, supple, no masses, no thyromegaly Respiratory: clear to auscultation bilaterally, no wheezing, no crackles. Normal respiratory effort. No accessory muscle use.  Cardiovascular: Regular rate and rhythm, no murmurs / rubs / gallops. No extremity edema. 2+ pedal pulses. No carotid bruits.  Abdomen: obese,  no tenderness, no masses palpated. No hepatosplenomegaly. Bowel sounds positive.  Musculoskeletal: no clubbing / cyanosis. No joint deformity upper and lower extremities. Good ROM, no contractures. decreased muscle tone. Wearing a forearm brace right arm. Skin: no rashes, lesions, ulcers. No induration Neurologic: CN- PERRLA, EOMI, normal facial symmetry and movement, no deviation of tongue. Decreased shoulder shrug - caveat is painful bone spur right elbow. MS - decreased grip strength right vs left; decreased strength right shoulder girdle; decreased strength right LE Flexor group, decreased supination right foot. DTR's difficult to test - equivocal. Speech is clear, cognition is slowed. Psychiatric: . Alert and oriented x 3. Normal mood.   (Anything < 9 systems with 2 bullets each down codes to level  1) (If patient refuses exam can't bill higher level) (Make sure to document decubitus ulcers present on admission -- if possible -- and whether patient has chronic indwelling catheter at time of admission)  Labs on Admission: I have personally reviewed following labs and imaging studies  CBC: Recent Labs  Lab 12/12/19 1036  WBC 7.8  NEUTROABS 6.3  HGB 11.7*  HCT 35.8*  MCV 85.6  PLT 256   Basic Metabolic Panel: Recent Labs  Lab 12/12/19 1036  NA 140  K 3.2*  CL  106  CO2 20*  GLUCOSE 42*  BUN 11  CREATININE 0.73  CALCIUM 8.8*   GFR: Estimated Creatinine Clearance: 57.9 mL/min (by C-G formula based on SCr of 0.73 mg/dL). Liver Function Tests: Recent Labs  Lab 12/12/19 1036  AST 26  ALT 18  ALKPHOS 161*  BILITOT 0.5  PROT 6.7  ALBUMIN 2.6*   No results for input(s): LIPASE, AMYLASE in the last 168 hours. No results for input(s): AMMONIA in the last 168 hours. Coagulation Profile: No results for input(s): INR, PROTIME in the last 168 hours. Cardiac Enzymes: No results for input(s): CKTOTAL, CKMB, CKMBINDEX, TROPONINI in the last 168 hours. BNP (last 3 results) No results for input(s): PROBNP in the last 8760 hours. HbA1C: No results for input(s): HGBA1C in the last 72 hours. CBG: Recent Labs  Lab 12/12/19 1342 12/12/19 1401 12/12/19 1555  GLUCAP 31* 93 80   Lipid Profile: No results for input(s): CHOL, HDL, LDLCALC, TRIG, CHOLHDL, LDLDIRECT in the last 72 hours. Thyroid Function Tests: No results for input(s): TSH, T4TOTAL, FREET4, T3FREE, THYROIDAB in the last 72 hours. Anemia Panel: No results for input(s): VITAMINB12, FOLATE, FERRITIN, TIBC, IRON, RETICCTPCT in the last 72 hours. Urine analysis:    Component Value Date/Time   COLORURINE YELLOW 12/12/2019 1407   APPEARANCEUR HAZY (A) 12/12/2019 1407   LABSPEC 1.019 12/12/2019 1407   PHURINE 5.0 12/12/2019 1407   GLUCOSEU 50 (A) 12/12/2019 1407   HGBUR NEGATIVE 12/12/2019 1407   BILIRUBINUR  NEGATIVE 12/12/2019 1407   KETONESUR NEGATIVE 12/12/2019 1407   PROTEINUR 30 (A) 12/12/2019 1407   UROBILINOGEN 0.2 05/27/2014 1203   NITRITE NEGATIVE 12/12/2019 1407   LEUKOCYTESUR NEGATIVE 12/12/2019 1407    Radiological Exams on Admission: CT Head Wo Contrast  Result Date: 12/12/2019 CLINICAL DATA:  73 year old female with altered mental status, increased lethargy for 1 week. Patient was recently hospitalized for UTI and COVID-19. EXAM: CT HEAD WITHOUT CONTRAST TECHNIQUE: Contiguous axial images were obtained from the base of the skull through the vertex without intravenous contrast. COMPARISON:  Head CT 05/27/2014. FINDINGS: Brain: Cerebral volume remains within normal limits for age. No midline shift, mass effect, or evidence of intracranial mass lesion. No ventriculomegaly. Confluent 15 millimeter hypodensity in the left basal ganglia is new since 2015 but age indeterminate. Scattered additional subcortical white matter hypodensity is mildly progressed since 2015. No acute intracranial hemorrhage identified. No cortically based acute infarct identified. Vascular: Calcified atherosclerosis at the skull base. No suspicious intracranial vascular hyperdensity. Skull: No acute osseous abnormality identified. Sinuses/Orbits: Visualized paranasal sinuses and mastoids are stable and well pneumatized. Small chronic left frontoethmoidal recess osteoma appear stable and inconsequential. Other: No acute orbit or scalp soft tissue findings. IMPRESSION: 1. Age indeterminate lacunar infarct in the left basal ganglia is new since 2015. Query recent right side weakness. 2. No other acute intracranial abnormality identified. Electronically Signed   By: Genevie Ann M.D.   On: 12/12/2019 15:30   CT Abdomen Pelvis W Contrast  Result Date: 12/12/2019 CLINICAL DATA:  Altered mental status EXAM: CT ABDOMEN AND PELVIS WITH CONTRAST TECHNIQUE: Multidetector CT imaging of the abdomen and pelvis was performed using the  standard protocol following bolus administration of intravenous contrast. CONTRAST:  114m OMNIPAQUE IOHEXOL 300 MG/ML  SOLN COMPARISON:  11/29/2019 noncontrast study FINDINGS: Lower chest: Partially imaged bibasilar peripheral opacities, which may reflect atelectasis or recent COVID infection. Hepatobiliary: No focal liver abnormality is seen. No gallstones, gallbladder wall thickening, or biliary dilatation. Pancreas: Unremarkable. Spleen: Unremarkable. Adrenals/Urinary  Tract: Bladder is mildly distended and partially obscured by artifact from hip arthroplasty. The distal ureters are also not well seen. Otherwise, fullness of the ureters and renal collecting systems, which may related to bladder distension. Adrenals are normal. Stomach/Bowel: Bowel is normal in caliber. Colon is primarily fluid-filled. Appendix is not visualized. Vascular/Lymphatic: Marked aortoiliac and branch atherosclerosis. No adenopathy. Reproductive: Endometrial canal remains distended with fluid. No adnexal mass. Other: No new abdominal wall finding.  No ascites. Musculoskeletal: Left hip arthroplasty is present with associated streak artifact. Degenerative changes of the spine. No acute osseous abnormality IMPRESSION: Mild fullness renal collecting systems and proximal to mid ureters, which may related to bladder distension. Normal caliber but primarily fluid-filled colon. Stable findings of hematometra. Electronically Signed   By: Macy Mis M.D.   On: 12/12/2019 15:39   DG Chest Port 1 View  Result Date: 12/12/2019 CLINICAL DATA:  COVID, altered mental status EXAM: PORTABLE CHEST 1 VIEW COMPARISON:  2019 FINDINGS: Interstitial prominence, likely chronic. No pleural effusion or pneumothorax. Stable cardiomediastinal contours with normal heart size. IMPRESSION: Mild interstitial prominence, which appears chronic. Electronically Signed   By: Macy Mis M.D.   On: 12/12/2019 10:59    EKG: Independently reviewed. EKG SR without  acute changes  Assessment/Plan Active Problems:   Hypothyroidism   Type 2 diabetes mellitus ([place])   Essential hypertension   Hyperlipidemia associated with type 2 diabetes mellitus ([facility])   EMPHYSEMA   Altered mental status  (please populate well all problems here in Problem List. (For example, if patient is on BP meds at home and you resume or decide to hold them, it is a problem that needs to be her. Same for CAD, COPD, HLD and so on)   1. Altered mental status - likely due to hypoglycemia. Plan Hold long acting injectable insulin  SS coverage  2. CVA - new lacunar infarct left basal ganglia on CT brain. MRI pending. On exam probable weakness right side. High risk: DM, HTN, HLD, covid 19. Had normal 2 D echo 12/02/2019` Plan Neuro consult  PT/OT eval  Carotid doppler,   Continue risk reduction  3. HTN- continue home meds  4. DM- see above. Will initiate glycemic control, IVF  DVT prophylaxis: lovenox (Lovenox/Heparin/SCD's/anticoagulated/None (if comfort care) Code Status: full code (Full/Partial (specify details) Family Communication: daughter in room ([place] name, relationship. Do not write "discussed with patient". Specify tel # if discussed over the phone) Disposition Plan: home when stable   (specify when and where you expect patient to be discharged) Consults called: neuro - Dr. Lorraine Lax  (with names) Admission status: inpatient (inpatient / obs / tele / medical floor / SDU)   Adella Hare MD [facility] Pager [phone]  If 7PM-7AM, please contact night-coverage [email or web address] Password [facility]  12/12/2019, 6:06 PM

## 2019-12-12 NOTE — ED Provider Notes (Signed)
[place] EMERGENCY DEPARTMENT Provider Note   CSN: [id number] Arrival date & time: 12/12/19  1015     History Chief Complaint  Patient presents with  . Weakness  . Altered Mental Status    [name] is a 74 y.o. female with past medical history of insulin-dependent type 2 diabetes, hypertension, COPD, Crohn's disease, presenting to the emergency department from home via EMS with altered mental status.  Patient was recently admitted to the hospital on 11/29/2019 and discharged on 12/02/2019 for pyelonephritis.  She was also noted to be Covid positive incidentally though asymptomatic.  She was discharged on 2-week course of Augmentin.  Per EMS, patient's family reports she has had gradually worsening fatigue/weakness and confusion over the last few days.  She is normally alert and oriented x3 though has been only alert to self over the last few days.  No fevers, cough, shortness of breath.  Patient is not able to provide history secondary to altered mental status.  Level 5 caveat.  The history is provided by the EMS personnel. The history is limited by the condition of the patient.       Past Medical History:  Diagnosis Date  . Arthritis   . Bilateral lower extremity edema   . Flank pain   . Full dentures   . History of cervical cancer    11/ 1994  Stage IB  s/p  high dose radiation brachytherapy @ [facility] 01/ 1995--- per pt no recurrence  . History of chronic bronchitis   . History of hyperthyroidism    d 03/ 2011---due to grave's disease--- s/p  RAI treatment 04/ 2011  . History of sepsis 05/03/2018   due to UTI with klebsiella/ pyelonephritis/ ureteral obstruction cause by stone  . Hyperlipidemia   . Hypothyroidism, postradioiodine therapy    endocrinologist--  dr Loanne Drilling--  dx graves disease and s/p RAI i131 treatement 4/ 2011  . Nauseated   . Oral thrush 05/03/2018  . Pneumonia   . Type 2 diabetes mellitus treated with insulin ([place])    FOLLOWED BY  PCP  . Urgency of urination   . Wears glasses     Patient Active Problem List   Diagnosis Date Noted  . Acute UTI 11/29/2019  . Normocytic anemia 11/29/2019  . Nausea, vomiting, and diarrhea 07/27/2019  . Hypokalemia 07/27/2019  . Hypomagnesemia 07/27/2019  . History of cervical cancer 03/18/2019  . Sepsis ([place]) 05/04/2018  . Acute lower UTI 05/04/2018  . Uncontrolled type 2 diabetes mellitus with hyperglycemia ([place]) 05/04/2018  . ARF (acute renal failure) ([place]) 05/04/2018  . Essential hypertension 05/04/2018  . Bilateral lower extremity edema 07/31/2017  . MUSCLE CRAMPS 09/19/2010  . NUMBNESS 09/19/2010  . Hypothyroidism 06/06/2010  . THRUSH 11/29/2008  . Type 2 diabetes mellitus ([place]) 10/20/2008  . Hyperlipidemia associated with type 2 diabetes mellitus ([place]) 10/20/2008  . BRONCHITIS, ACUTE WITH BRONCHOSPASM 10/20/2008  . EMPHYSEMA 10/20/2008  . C O P D 10/20/2008  . CROHN'S DISEASE 10/20/2008  . TOBACCO ABUSE-HISTORY OF 10/20/2008    Past Surgical History:  Procedure Laterality Date  . APPENDECTOMY    . CATARACT EXTRACTION W/ INTRAOCULAR LENS  IMPLANT, BILATERAL  2004  approx.  . CYSTOSCOPY WITH STENT PLACEMENT Right 05/04/2018   Procedure: CYSTOSCOPY WITH STENT PLACEMENT AND RETROGRADE PYELOGRAM;  Surgeon: Ceasar Mons, MD;  Location: [facility];  Service: Urology;  Laterality: Right;  . CYSTOSCOPY/URETEROSCOPY/HOLMIUM LASER/STENT PLACEMENT Right 05/19/2018   Procedure: CYSTOSCOPY, URETEROSCOPY/HOLMIUM LASER, STONE BASKETRY/ STENT  EXCHANGE;  Surgeon: Ceasar Mons, MD;  Location: [facility];  Service: Urology;  Laterality: Right;  . EXCISION MASS LEFT CHEST WALL  09-20-2007   dr [place] Silos  [facility]  . Radioactive Iodine Therapy     for thyroid  . REVISION TOTAL HIP ARTHROPLASTY Left early 2000s  . TANDEM RING INSERTION  1995   dr Aldean Ast @ [facility]   EUA w/ tandem placement in ovid  (for direct high dose radiation brachytherapy ,  cervical cancer)  . TOTAL HIP ARTHROPLASTY Left 1990s  . TRANSTHORACIC ECHOCARDIOGRAM  08/07/2017   mild focal basal hypertrophy of the septum,  ef 75-64%, grade 1 diastolic dysfunction/  atrial septum with lipomatous hypertrophy/  trivial PR     OB History   No obstetric history on file.     Family History  Problem Relation Age of Onset  . Emphysema Father   . Diabetes Father   . Emphysema Sister   . Emphysema Brother   . Cancer Mother        Skin Cancer  . Diabetes Mother     Social History   Tobacco Use  . Smoking status: Former Smoker    Years: 5.00    Types: Cigarettes    Quit date: 05/13/1982    Years since quitting: 37.6  . Smokeless tobacco: Never Used  Substance Use Topics  . Alcohol use: No  . Drug use: No    Home Medications Prior to Admission medications   Medication Sig Start Date End Date Taking? Authorizing Provider  alendronate (FOSAMAX) 10 MG tablet Take 10 mg by mouth every 7 (seven) days.     [provider]  ALPRAZolam Duanne Moron) 0.25 MG tablet Take 0.25 mg by mouth at bedtime.  08/11/19   [provider]  amoxicillin-clavulanate (AUGMENTIN) 875-125 MG tablet Take 1 tablet by mouth 2 (two) times daily for 14 days. 12/02/19 12/16/19  Elie Confer, MD  baclofen (LIORESAL) 10 MG tablet Take 10 mg by mouth at bedtime.     [provider]  bisacodyl (DULCOLAX) 5 MG EC tablet Take 1 tablet (5 mg total) by mouth daily as needed for moderate constipation. 07/27/19 07/26/20  Donne Hazel, MD  ergocalciferol (VITAMIN D2) 50000 UNITS capsule Take 50,000 Units by mouth once a week. Sundays    [provider]  gabapentin (NEURONTIN) 300 MG capsule Take 900 mg by mouth 2 (two) times daily.     [provider]  gemfibrozil (LOPID) 600 MG tablet Take 600 mg by mouth 2 (two) times daily.      [provider]  guaiFENesin-dextromethorphan (ROBITUSSIN DM) 100-10 MG/5ML syrup Take 5 mLs by mouth every 4 (four) hours as  needed for cough. 12/02/19   Elie Confer, MD  insulin aspart (NOVOLOG) 100 UNIT/ML injection Inject 2-16 Units into the skin 3 (three) times daily before meals. Sliding scale  150-200- 2 units 201-250=3 units 251-300=5 units 301-350 =7 units 351-400 =9 units 401-450=16 units    [provider]  insulin degludec (TRESIBA FLEXTOUCH) 100 UNIT/ML SOPN FlexTouch Pen Inject 40 Units into the skin at bedtime.    [provider]  levothyroxine (SYNTHROID) 125 MCG tablet Take 125 mcg by mouth daily before breakfast.  03/01/19   [provider]  metFORMIN (GLUCOPHAGE) 500 MG tablet Take 500 mg by mouth 2 (two) times daily with a meal.    [provider]  methocarbamol (ROBAXIN) 500 MG tablet Take 500 mg by mouth 2 (two)  times daily as needed (leg cramps).  04/22/19   [provider]  oxybutynin (DITROPAN-XL) 10 MG 24 hr tablet Take 10 mg by mouth daily. 07/26/19   [provider]  oxyCODONE (OXYCONTIN) 10 mg 12 hr tablet Take 1 tablet (10 mg total) by mouth every 12 (twelve) hours. 12/02/19   Elie Confer, MD  polyethylene glycol (MIRALAX) 17 g packet Take 17 g by mouth daily as needed for moderate constipation. 07/27/19   Donne Hazel, MD  vitamin B-12 (CYANOCOBALAMIN) 1000 MCG tablet Take 1,000 mcg by mouth daily.    [provider]    Allergies    Codeine  Review of Systems   Review of Systems  Unable to perform ROS: Mental status change    Physical Exam Updated Vital Signs BP (!) 155/69   Pulse 70   Temp 99.8 F (37.7 C) (Rectal)   Resp 13   SpO2 96%   Physical Exam Vitals and nursing note reviewed.  Constitutional:      General: She is not in acute distress.    Appearance: She is well-developed.  HENT:     Head: Normocephalic and atraumatic.  Eyes:     Conjunctiva/sclera: Conjunctivae normal.  Cardiovascular:     Rate and Rhythm: Normal rate and regular rhythm.  Pulmonary:     Effort: Pulmonary effort is  normal. No respiratory distress.     Breath sounds: Normal breath sounds.  Abdominal:     General: Bowel sounds are normal.     Palpations: Abdomen is soft.     Tenderness: There is abdominal tenderness in the right lower quadrant, suprapubic area and left lower quadrant. There is no guarding or rebound.  Musculoskeletal:     Right lower leg: No edema.     Left lower leg: No edema.  Skin:    General: Skin is warm.  Neurological:     Mental Status: She is alert.     Comments: Patient is able to make eye contact and is responding to verbal stimuli, however will not answer questions.  She is localizing to pain.  Psychiatric:        Behavior: Behavior normal.     ED Results / Procedures / Treatments   Labs (all labs ordered are listed, but only abnormal results are displayed) Labs Reviewed  CBC WITH DIFFERENTIAL/PLATELET - Abnormal; Notable for the following components:      Result Value   Hemoglobin 11.7 (*)    HCT 35.8 (*)    All other components within normal limits  URINE CULTURE  CULTURE, BLOOD (ROUTINE X 2)  CULTURE, BLOOD (ROUTINE X 2)  COMPREHENSIVE METABOLIC PANEL  URINALYSIS, ROUTINE W REFLEX MICROSCOPIC  LACTIC ACID, PLASMA  LACTIC ACID, PLASMA  CBG MONITORING, ED    EKG EKG Interpretation  Date/Time:  Monday December 12 2019 10:24:52 EST Ventricular Rate:  70 PR Interval:    QRS Duration: 101 QT Interval:  444 QTC Calculation: 480 R Axis:   69 Text Interpretation: Appears to be sinus, limited by artifact Borderline T wave abnormalities no significant change since Sept 2020 Confirmed by Sherwood Gambler [phone]) on 12/12/2019 10:30:00 AM   Radiology DG Chest Port 1 View  Result Date: 12/12/2019 CLINICAL DATA:  COVID, altered mental status EXAM: PORTABLE CHEST 1 VIEW COMPARISON:  2019 FINDINGS: Interstitial prominence, likely chronic. No pleural effusion or pneumothorax. Stable cardiomediastinal contours with normal heart size. IMPRESSION: Mild interstitial  prominence, which appears chronic. Electronically Signed   By: Malachi Carl  Patel M.D.   On: 12/12/2019 10:59    Procedures Procedures (including critical care time)  Medications Ordered in ED Medications  sodium chloride 0.9 % bolus 1,000 mL (has no administration in time range)    ED Course  I have reviewed the triage vital signs and the nursing notes.  Pertinent labs & imaging results that were available during my care of the patient were reviewed by me and considered in my medical decision making (see chart for details).  Clinical Course as of Dec 11 1522  Mon Dec 12, 2019  1153 Spoke with Janetta Hora, daughter. [phone]. She reports pt has had low blood sugars over the last few days as well. She has had dec PO intake. Not getting sliding scale insulin due to this, but still getting long acting dose at bedtime. She has had dec BM and urinary output as well since Saturday.   [JR]  1347 Glucose significantly low. Unfortunately CBG was ordered but delayed in collection, therefore hypoglycemia was missed on initial presentation. Will treat hypoglycemia and re-evaluate for improvement in mental status. Will cancel CT scans at this time pending re-evaluation   Glucose(!!): 42 [JR]  1439 Patient's mental status with some improvement after improving glucose level.  She is alert to person and can tell me her daughter's name, however she is still unable to tell me her location or time.  She denies any current complaints at this time.  Discussed with Dr. Regenia Skeeter.  We will proceed with CT imaging to evaluate for other potential causes of altered mental status.   [JR]    Clinical Course User Index [JR] Jeffre Enriques, [place] N, PA-C   MDM Rules/Calculators/A&P                      Patient presenting to the ED with altered mental status that has been gradually worsening over the week.  She was recently admitted for altered mental status in setting of pyelonephritis on 11/29/2019, discharged on  12/02/2019.  Patient's daughter over the phone states she was back to her baseline upon discharge, however after a few days began becoming confused once more.  She has been pliant with her Augmentin, urine culture appears sensitive for this.  She said decreased p.o. intake over the last few days as well per her daughter.  She is usually alert and oriented x3.  On arrival, she is alert and making eye contact, localizing to pain, however cannot provide history.  Labs obtained, IV fluids.  No leukocytosis or significant anemia.  No AKI.  Urine with rare bacteria and otherwise unremarkable.  Culture sent. Low suspicion for persistent urinary source.  Lactic is 1.9.  Unfortunately was some delay in obtaining CBG, delaying diagnosis of hypoglycemia.  This was treated with 1 amp of D50.  On reevaluation, patient's mental status somewhat improved, however patient remains altered.  She is now more alert, oriented to herself and can tell me her daughter's name, however is still unable to tell me where she is or time.  Patient currently does not have complaints however on exam did have some lower abdominal tenderness.  Patient discussed with Dr. Regenia Skeeter.  CT head and abdomen ordered for further evaluation of possible cause of altered mental status.  Patient remains altered, will likely need readmission.  Care assumed at shift change by Dr. Yong Channel, to follow imaging and determine disposition.   Final Clinical Impression(s) / ED Diagnoses Final diagnoses:  None    Rx / DC Orders  ED Discharge Orders    None       Celestine Prim, [place] N, PA-C 12/12/19 1528    Sherwood Gambler, MD 12/13/19 [phone]

## 2019-12-12 NOTE — Progress Notes (Signed)
RN paged NP.  Pt was asymptomatic and diagnosed with COVID more than 10 days ago.  She was not treated at that time due to being asymptomatic. She remains asymptomatic and is admitted for AMS with hypoglycemia and CVA.  Per algorithm, she does not require isolation.

## 2019-12-12 NOTE — ED Notes (Signed)
Admitting paged to RN per her request 

## 2019-12-12 NOTE — ED Notes (Signed)
Patient given two cups orange juice

## 2019-12-12 NOTE — ED Provider Notes (Signed)
Transfer of Care Note I assumed care of ZIONA WICKENS on 12/12/2019  Briefly, CHARYL MINERVINI is a 74 y.o. female who:  Presents with altered mental status  Hypoglycemia  Covid/UTI recent admission  The plan includes:  F/u Ct scan head/belly  Likely come in if continued altered   reassess   Please refer to the original provider's note for additional information regarding the care of KRESTA TEMPLEMAN.  ### Reassessment: I personally reassessed the patient:  Vital Signs:  The most current vitals were  Vitals:   12/12/19 1545 12/12/19 1615  BP:    Pulse: 71 83  Resp: 16 15  Temp:    SpO2: 98% 99%    Hemodynamics:  The patient is hemodynamically stable. Mental Status:  The patient is Alert  Additional MDM: On reassessment, vital signs stable, CT head was obtained which was reassuring for bleed or mass however there is a remote lacunar infarct that was noted, MRI brain was ordered as well.  Given patient is not at her mental baseline as well as her hypoglycemia new CT scan finding likely will need admission for observation and continued work-up for possible subacute to remote stroke.  Admitted in stable condition.  The attending physician was present and available for all medical decision making and procedures related to this patient's care.    Kizzie Fantasia, MD 12/12/19 1646    Blanchie Dessert, MD 12/12/19 2118

## 2019-12-12 NOTE — ED Notes (Signed)
Attempted to call report; advised that Charge nurse is reviewing the chart and will call me back.

## 2019-12-12 NOTE — ED Notes (Signed)
Admitting phys returned page and new orders received.

## 2019-12-13 ENCOUNTER — Inpatient Hospital Stay (HOSPITAL_COMMUNITY): Payer: Medicare HMO

## 2019-12-13 DIAGNOSIS — I6389 Other cerebral infarction: Secondary | ICD-10-CM

## 2019-12-13 DIAGNOSIS — R4182 Altered mental status, unspecified: Secondary | ICD-10-CM

## 2019-12-13 LAB — BASIC METABOLIC PANEL
Anion gap: 13 (ref 5–15)
Anion gap: 13 (ref 5–15)
BUN: 7 mg/dL — ABNORMAL LOW (ref 8–23)
BUN: 8 mg/dL (ref 8–23)
CO2: 21 mmol/L — ABNORMAL LOW (ref 22–32)
CO2: 22 mmol/L (ref 22–32)
Calcium: 8.5 mg/dL — ABNORMAL LOW (ref 8.9–10.3)
Calcium: 8.5 mg/dL — ABNORMAL LOW (ref 8.9–10.3)
Chloride: 107 mmol/L (ref 98–111)
Chloride: 109 mmol/L (ref 98–111)
Creatinine, Ser: 0.67 mg/dL (ref 0.44–1.00)
Creatinine, Ser: 0.73 mg/dL (ref 0.44–1.00)
GFR calc Af Amer: >60 mL/min (ref >60)
GFR calc Af Amer: >60 mL/min (ref >60)
GFR calc non Af Amer: >60 mL/min (ref >60)
GFR calc non Af Amer: >60 mL/min (ref >60)
Glucose, Bld: 37 mg/dL — CL (ref 70–99)
Glucose, Bld: 119 mg/dL — ABNORMAL HIGH (ref 70–99)
Potassium: 2.5 mmol/L — CL (ref 3.5–5.1)
Potassium: 2.6 mmol/L — CL (ref 3.5–5.1)
Sodium: 141 mmol/L (ref 135–145)
Sodium: 144 mmol/L (ref 135–145)

## 2019-12-13 LAB — GLUCOSE, CAPILLARY
Glucose-Capillary: 119 mg/dL — ABNORMAL HIGH (ref 70–99)
Glucose-Capillary: 157 mg/dL — ABNORMAL HIGH (ref 70–99)
Glucose-Capillary: 263 mg/dL — ABNORMAL HIGH (ref 70–99)
Glucose-Capillary: 49 mg/dL — ABNORMAL LOW (ref 70–99)
Glucose-Capillary: 54 mg/dL — ABNORMAL LOW (ref 70–99)
Glucose-Capillary: 63 mg/dL — ABNORMAL LOW (ref 70–99)
Glucose-Capillary: 93 mg/dL (ref 70–99)

## 2019-12-13 LAB — C-REACTIVE PROTEIN: CRP: 14.4 mg/dL — ABNORMAL HIGH (ref <1.0)

## 2019-12-13 LAB — ECHOCARDIOGRAM LIMITED

## 2019-12-13 LAB — D-DIMER, QUANTITATIVE: D-Dimer, Quant: 1.34 ug/mL-FEU — ABNORMAL HIGH (ref 0.00–0.50)

## 2019-12-13 LAB — SEDIMENTATION RATE: Sed Rate: 105 mm/hr — ABNORMAL HIGH (ref 0–22)

## 2019-12-13 LAB — MAGNESIUM: Magnesium: 1.4 mg/dL — ABNORMAL LOW (ref 1.7–2.4)

## 2019-12-13 MED ORDER — OXYCODONE HCL ER 10 MG PO T12A
10.0000 mg | EXTENDED_RELEASE_TABLET | Freq: Two times a day (BID) | ORAL | Status: DC
  Administered 2019-12-13 – 2019-12-15 (×4): 10 mg via ORAL
  Filled 2019-12-13 (×4): qty 1

## 2019-12-13 MED ORDER — ASPIRIN EC 81 MG PO TBEC
81.0000 mg | DELAYED_RELEASE_TABLET | Freq: Every day | ORAL | Status: DC
  Administered 2019-12-13 – 2019-12-15 (×3): 81 mg via ORAL
  Filled 2019-12-13 (×3): qty 1

## 2019-12-13 MED ORDER — POTASSIUM CHLORIDE CRYS ER 20 MEQ PO TBCR
40.0000 meq | EXTENDED_RELEASE_TABLET | ORAL | Status: AC
  Administered 2019-12-13 (×2): 40 meq via ORAL
  Filled 2019-12-13 (×2): qty 2

## 2019-12-13 MED ORDER — POTASSIUM CHLORIDE 10 MEQ/100ML IV SOLN
10.0000 meq | INTRAVENOUS | Status: DC
  Filled 2019-12-13: qty 100

## 2019-12-13 MED ORDER — CLOPIDOGREL BISULFATE 75 MG PO TABS
75.0000 mg | ORAL_TABLET | Freq: Every day | ORAL | Status: DC
  Administered 2019-12-13 – 2019-12-15 (×3): 75 mg via ORAL
  Filled 2019-12-13 (×3): qty 1

## 2019-12-13 MED ORDER — DEXTROSE-NACL 5-0.9 % IV SOLN: INTRAVENOUS | Status: DC

## 2019-12-13 MED ORDER — ASPIRIN 300 MG RE SUPP: 300.0000 mg | Freq: Once | RECTAL | Status: DC

## 2019-12-13 MED ORDER — DEXTROSE 50 % IV SOLN: 25.0000 g | Freq: Once | INTRAVENOUS | Status: AC

## 2019-12-13 MED ORDER — POTASSIUM CHLORIDE 10 MEQ/100ML IV SOLN
10.0000 meq | INTRAVENOUS | Status: DC
  Administered 2019-12-13 (×3): 10 meq via INTRAVENOUS
  Filled 2019-12-13 (×3): qty 100

## 2019-12-13 MED ORDER — LORAZEPAM 2 MG/ML IJ SOLN
1.0000 mg | Freq: Once | INTRAMUSCULAR | Status: AC
  Administered 2019-12-13: 1 mg via INTRAVENOUS
  Filled 2019-12-13: qty 1

## 2019-12-13 MED ORDER — STROKE: EARLY STAGES OF RECOVERY BOOK
Freq: Once | Status: AC
  Filled 2019-12-13: qty 1

## 2019-12-13 MED ORDER — DEXTROSE 50 % IV SOLN
INTRAVENOUS | Status: AC
  Filled 2019-12-13: qty 50

## 2019-12-13 NOTE — Progress Notes (Signed)
EEG complete - results pending 

## 2019-12-13 NOTE — Progress Notes (Signed)
Stat BMP ordered to verify previous results.  BMP at 02:46 this morning showed K at 2.5 and blood glucose at 37.  When staff checked CBG prior to administering glucose, the reading was 263.

## 2019-12-13 NOTE — Progress Notes (Signed)
Spoke Esther (IP) about pt's status. She informed me of the algorithm that is followed for pt's with covid. Per algorithm, a re-test would not be appropriate for this pt. Dr. Lorraine Lax updated, after covid re-test ordered. Pt's BG 37 and potassium 2.5 per lab and Chrg nurse. Pt's BG 263 before d50 admin. M. Sharlet Salina updated and a STAT BMP requested.

## 2019-12-13 NOTE — Evaluation (Addendum)
Physical Therapy Evaluation Patient Details Name: [name] MRN: [id number] DOB: 03-Aug-1946 Today's Date: 12/13/2019   History of Present Illness  Pt is a 74 y.o. female with past with history significant for insulin-dependent diabetes mellitus, hypertension, COPD with recent admission for pyelonephritis on 1/12, incidentally found to be COVID-19 positive presents to the emergency department with altered mental status.  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  Clinical Impression  Pt admitted with above diagnosis. Pt required cues for sequence and increased time for activity.  She required min-mod A for transfers and to ambulate 5' with RW.  Pt has DME at home and has strong family support. Pt currently with functional limitations due to the deficits listed below (see PT Problem List). Pt will benefit from skilled PT to increase their independence and safety with mobility to allow discharge to the venue listed below.       Follow Up Recommendations Home health PT;Supervision/Assistance - 24 hour(Family declined SNF and can provide 24 hr support (daughter present and very supportive))    Equipment Recommendations  None recommended by PT    Recommendations for Other Services       Precautions / Restrictions Precautions Precautions: Fall      Mobility  Bed Mobility Overal bed mobility: Needs Assistance Bed Mobility: Rolling Rolling: Mod assist;+2 for physical assistance   Supine to sit: Mod assist Sit to supine: Min assist   General bed mobility comments: mod A to scoot forward; min A to get legs back in bed and for positioning; required increased time and cues for sequencing  Transfers Overall transfer level: Needs assistance Equipment used: Rolling walker (2 wheeled) Transfers: Sit to/from [facility] Sit to Stand: Min assist Stand pivot transfers: Min assist       General transfer comment: sit to stand x 3; cued for safe  hand placement; required increased time and min A to boost; min A for RW management with turns; assist for toileting ADLs (daughter assisted)  Ambulation/Gait Ambulation/Gait assistance: Min assist Gait Distance (Feet): 5 Feet Assistive device: Rolling walker (2 wheeled) Gait Pattern/deviations: Decreased stride length;Narrow base of support;Shuffle Gait velocity: decreased   General Gait Details: fatigued easily; required assist with RW; initially with posterior lean improved with cues and time; cued for sequencing  Stairs            Wheelchair Mobility    Modified Rankin (Stroke Patients Only)       Balance Overall balance assessment: Needs assistance Sitting-balance support: Bilateral upper extremity supported;Feet supported Sitting balance-Leahy Scale: Good     Standing balance support: Bilateral upper extremity supported;During functional activity Standing balance-Leahy Scale: Poor Standing balance comment: pt initially with posterior lean; cued for upright posture verbally and tactile; improved only requiring min guard                             Pertinent Vitals/Pain Pain Assessment: No/denies pain    Home Living Family/patient expects to be discharged to:: Private residence Living Arrangements: Children;Spouse/significant other Available Help at Discharge: Family;Available 24 hours/day Type of Home: House Home Access: Stairs to enter Entrance Stairs-Rails: Psychiatric nurse of Steps: 2 Home Layout: One level Home Equipment: Bedside commode;Shower seat;Walker - 2 wheels;Cane - single point;Grab bars - tub/shower;Other (comment);Wheelchair - manual(can borrow w/c if needed)      Prior Function Level of Independence: Independent with assistive device(s)   Gait / Transfers Assistance  Needed: Prior to UTI 2 weeks ago completely independent with  use of cane; could ambulate in community.  For past few weeks using a RW and needing  assistance with ADls.  ADL's / Homemaking Assistance Needed: Normally independent        Hand Dominance        Extremity/Trunk Assessment   Upper Extremity Assessment Upper Extremity Assessment: LUE deficits/detail;RUE deficits/detail RUE Deficits / Details: ROM grossly WFL; Strength 4/5; R hand with edema (RN aware) LUE Deficits / Details: ROM grossly WFL; Strength 4+/5    Lower Extremity Assessment Lower Extremity Assessment: LLE deficits/detail;RLE deficits/detail RLE Deficits / Details: ROM WFL; MMT knee and ankle 5/5; hip 2/5 LLE Deficits / Details: ROM WFL; MMT knee and ankle 5/5; hip 2/5    Cervical / Trunk Assessment Cervical / Trunk Assessment: Normal  Communication   Communication: No difficulties  Cognition Arousal/Alertness: Awake/alert Behavior During Therapy: WFL for tasks assessed/performed Overall Cognitive Status: Impaired/Different from baseline Area of Impairment: Problem solving;Orientation                 Orientation Level: Disoriented to;Time;Situation           Problem Solving: Slow processing;Requires verbal cues;Requires tactile cues General Comments: daughter reports pt normally very cognitively aware (pays bills, keeps up with appts, etc); requried increased time      General Comments General comments (skin integrity, edema, etc.): On RA with O2 sats upper 90's  Noted during chart review that pt's potassium was 2.6 - spoke with RN and pt has received potassium.   Exercises     Assessment/Plan    PT Assessment Patient needs continued PT services  PT Problem List Decreased strength;Decreased mobility;Decreased range of motion;Decreased knowledge of precautions;Decreased activity tolerance;Decreased cognition;Decreased balance;Decreased knowledge of use of DME       PT Treatment Interventions DME instruction;Therapeutic activities;Gait training;Therapeutic exercise;Patient/family education;Stair training;Balance  training;Functional mobility training    PT Goals (Current goals can be found in the Care Plan section)  Acute Rehab PT Goals Patient Stated Goal: return home PT Goal Formulation: With patient/family Time For Goal Achievement: 12/27/19 Potential to Achieve Goals: Good    Frequency Min 3X/week   Barriers to discharge        Co-evaluation               AM-PAC PT "6 Clicks" Mobility  Outcome Measure Help needed turning from your back to your side while in a flat bed without using bedrails?: A Lot Help needed moving from lying on your back to sitting on the side of a flat bed without using bedrails?: A Lot Help needed moving to and from a bed to a chair (including a wheelchair)?: A Little Help needed standing up from a chair using your arms (e.g., wheelchair or bedside chair)?: A Little Help needed to walk in hospital room?: A Little Help needed climbing 3-5 steps with a railing? : A Lot 6 Click Score: 15    End of Session Equipment Utilized During Treatment: Gait belt Activity Tolerance: Patient tolerated treatment well Patient left: in bed;with family/visitor present;with call bell/phone within reach;with bed alarm set Nurse Communication: Mobility status PT Visit Diagnosis: Other abnormalities of gait and mobility (R26.89);Muscle weakness (generalized) (M62.81)    Time: 1700-1740 PT Time Calculation (min) (ACUTE ONLY): 40 min   Charges:   PT Evaluation $PT Eval Moderate Complexity: 1 Mod PT Treatments $Therapeutic Activity: 8-22 mins        Maggie Font, PT Acute Rehab Services  Pager [phone] [facility] [place]   [name] 12/13/2019, 5:53 PM

## 2019-12-13 NOTE — Progress Notes (Signed)
STROKE TEAM PROGRESS NOTE   INTERVAL HISTORY No family at bedside. Pt still encephalopathic, not orientated to age, place, time or situation. inconsistently following commands or answering questions. Seems to have right sided weakness but not cooperative on exam.    Vitals:   12/12/19 2356 12/13/19 0358 12/13/19 0803 12/13/19 1003  BP: (!) 150/58 (!) 150/60 (!) 143/48 138/61  Pulse: 69 66 61 73  Resp: 16 18 14 13   Temp: 98 F (36.7 C) 98.3 F (36.8 C) 98.5 F (36.9 C)   TempSrc: Oral Oral Oral   SpO2: 97% 95% 98% 97%    CBC:  Recent Labs  Lab 12/12/19 1036  WBC 7.8  NEUTROABS 6.3  HGB 11.7*  HCT 35.8*  MCV 85.6  PLT 272    Basic Metabolic Panel:  Recent Labs  Lab 12/13/19 0246 12/13/19 0640 12/13/19 0655  NA 141 144  --   K 2.5* 2.6*  --   CL 107 109  --   CO2 21* 22  --   GLUCOSE 37* 119*  --   BUN 7* 8  --   CREATININE 0.67 0.73  --   CALCIUM 8.5* 8.5*  --   MG  --   --  1.4*   Lipid Panel: No results found for: CHOL, TRIG, HDL, CHOLHDL, VLDL, LDLCALC HgbA1c:  Lab Results  Component Value Date   HGBA1C 6.1 (H) 07/27/2019   Urine Drug Screen: No results found for: LABOPIA, COCAINSCRNUR, LABBENZ, AMPHETMU, THCU, LABBARB  Alcohol Level No results found for: ETH  IMAGING past 48 hours CT Head Wo Contrast  Result Date: 12/12/2019 CLINICAL DATA:  74 year old female with altered mental status, increased lethargy for 1 week. Patient was recently hospitalized for UTI and COVID-19. EXAM: CT HEAD WITHOUT CONTRAST TECHNIQUE: Contiguous axial images were obtained from the base of the skull through the vertex without intravenous contrast. COMPARISON:  Head CT 05/27/2014. FINDINGS: Brain: Cerebral volume remains within normal limits for age. No midline shift, mass effect, or evidence of intracranial mass lesion. No ventriculomegaly. Confluent 15 millimeter hypodensity in the left basal ganglia is new since 2015 but age indeterminate. Scattered additional subcortical white  matter hypodensity is mildly progressed since 2015. No acute intracranial hemorrhage identified. No cortically based acute infarct identified. Vascular: Calcified atherosclerosis at the skull base. No suspicious intracranial vascular hyperdensity. Skull: No acute osseous abnormality identified. Sinuses/Orbits: Visualized paranasal sinuses and mastoids are stable and well pneumatized. Small chronic left frontoethmoidal recess osteoma appear stable and inconsequential. Other: No acute orbit or scalp soft tissue findings. IMPRESSION: 1. Age indeterminate lacunar infarct in the left basal ganglia is new since 2015. Query recent right side weakness. 2. No other acute intracranial abnormality identified. Electronically Signed   By: Genevie Ann M.D.   On: 12/12/2019 15:30   MR ANGIO HEAD WO CONTRAST  Result Date: 12/13/2019 CLINICAL DATA:  Altered mental status EXAM: MRI HEAD WITHOUT CONTRAST MRA HEAD WITHOUT CONTRAST TECHNIQUE: Multiplanar, multiecho pulse sequences of the brain and surrounding structures were obtained without intravenous contrast. Angiographic images of the head were obtained using MRA technique without contrast. COMPARISON:  Head CT from yesterday FINDINGS: MRI HEAD FINDINGS Brain: Wedge of restricted diffusion in the left basal ganglia spanning the upper putamen and caudate body via the corona radiata. Background of mild chronic small vessel ischemia. No hemorrhage, hydrocephalus, collection, or masslike finding Vascular: Normal flow voids.  Arterial findings below. Skull and upper cervical spine: Normal marrow signal. Sinuses/Orbits: Bilateral cataract resection. Other: Progressively motion  degraded. MRA HEAD FINDINGS Unremarkable intracranial anatomy with incomplete circle-of-Willis at the level of the posterior communicating arteries. Mild motion degradation. Tortuous carotid siphons with mild atherosclerotic type irregularity versus flow artifact. No branch occlusion or flow limiting stenosis.  Negative for aneurysm. IMPRESSION: 1. Acute perforator infarct at the left basal ganglia. 2. Background mild chronic small vessel ischemia in the cerebral white matter. 3. Negative intracranial MRA. Electronically Signed   By: Monte Fantasia M.D.   On: 12/13/2019 05:03   MR BRAIN WO CONTRAST  Result Date: 12/13/2019 CLINICAL DATA:  Altered mental status EXAM: MRI HEAD WITHOUT CONTRAST MRA HEAD WITHOUT CONTRAST TECHNIQUE: Multiplanar, multiecho pulse sequences of the brain and surrounding structures were obtained without intravenous contrast. Angiographic images of the head were obtained using MRA technique without contrast. COMPARISON:  Head CT from yesterday FINDINGS: MRI HEAD FINDINGS Brain: Wedge of restricted diffusion in the left basal ganglia spanning the upper putamen and caudate body via the corona radiata. Background of mild chronic small vessel ischemia. No hemorrhage, hydrocephalus, collection, or masslike finding Vascular: Normal flow voids.  Arterial findings below. Skull and upper cervical spine: Normal marrow signal. Sinuses/Orbits: Bilateral cataract resection. Other: Progressively motion degraded. MRA HEAD FINDINGS Unremarkable intracranial anatomy with incomplete circle-of-Willis at the level of the posterior communicating arteries. Mild motion degradation. Tortuous carotid siphons with mild atherosclerotic type irregularity versus flow artifact. No branch occlusion or flow limiting stenosis. Negative for aneurysm. IMPRESSION: 1. Acute perforator infarct at the left basal ganglia. 2. Background mild chronic small vessel ischemia in the cerebral white matter. 3. Negative intracranial MRA. Electronically Signed   By: Monte Fantasia M.D.   On: 12/13/2019 05:03   CT Abdomen Pelvis W Contrast  Result Date: 12/12/2019 CLINICAL DATA:  Altered mental status EXAM: CT ABDOMEN AND PELVIS WITH CONTRAST TECHNIQUE: Multidetector CT imaging of the abdomen and pelvis was performed using the standard  protocol following bolus administration of intravenous contrast. CONTRAST:  147m OMNIPAQUE IOHEXOL 300 MG/ML  SOLN COMPARISON:  11/29/2019 noncontrast study FINDINGS: Lower chest: Partially imaged bibasilar peripheral opacities, which may reflect atelectasis or recent COVID infection. Hepatobiliary: No focal liver abnormality is seen. No gallstones, gallbladder wall thickening, or biliary dilatation. Pancreas: Unremarkable. Spleen: Unremarkable. Adrenals/Urinary Tract: Bladder is mildly distended and partially obscured by artifact from hip arthroplasty. The distal ureters are also not well seen. Otherwise, fullness of the ureters and renal collecting systems, which may related to bladder distension. Adrenals are normal. Stomach/Bowel: Bowel is normal in caliber. Colon is primarily fluid-filled. Appendix is not visualized. Vascular/Lymphatic: Marked aortoiliac and branch atherosclerosis. No adenopathy. Reproductive: Endometrial canal remains distended with fluid. No adnexal mass. Other: No new abdominal wall finding.  No ascites. Musculoskeletal: Left hip arthroplasty is present with associated streak artifact. Degenerative changes of the spine. No acute osseous abnormality IMPRESSION: Mild fullness renal collecting systems and proximal to mid ureters, which may related to bladder distension. Normal caliber but primarily fluid-filled colon. Stable findings of hematometra. Electronically Signed   By: PMacy MisM.D.   On: 12/12/2019 15:39   DG Chest Port 1 View  Result Date: 12/12/2019 CLINICAL DATA:  COVID, altered mental status EXAM: PORTABLE CHEST 1 VIEW COMPARISON:  2019 FINDINGS: Interstitial prominence, likely chronic. No pleural effusion or pneumothorax. Stable cardiomediastinal contours with normal heart size. IMPRESSION: Mild interstitial prominence, which appears chronic. Electronically Signed   By: PMacy MisM.D.   On: 12/12/2019 10:59    PHYSICAL EXAM  Temp:  [98 F (36.7 C)-99.7 F  (  37.6 C)] 98.5 F (36.9 C) (01/26 0803) Pulse Rate:  [57-73] 58 (01/26 1603) Resp:  [13-20] 16 (01/26 1603) BP: (123-150)/(41-77) 127/41 (01/26 1603) SpO2:  [95 %-100 %] 100 % (01/26 1603)  General - Well nourished, well developed, lethargic.  Ophthalmologic - fundi not visualized due to noncooperation.  Cardiovascular - Regular rhythm and rate.  Neuro - lethargic but open eyes on voice. Inconsistent on following commands or answering questions. Seems to have mild expressive aphasia at one time, but then was able to repeat simple sentences and answer with short words at another time. Moderate dysarthria. Not able to name. No gaze deviation, PERRL. Mild right nasolabial fold flattening. Tongue midline. LUE 4/5 and RUE 3/5. BLEs proximal 3-/5 and distal LLE 4/5 and RLE 2/5. Sensation, coordination not cooperative and gait not tested.     ASSESSMENT/PLAN [name] is a 75 y.o. female with history of insulin-dependent diabetes mellitus, hypertension, COPD with recent admission for pyelonephritis on 1/12, incidentally found to be COVID-19 positive presenting with increasing fatigue found to have glucose of 42 and R sided weakness.   Stroke:   L basal ganglia infarct, likely secondary to small vessel disease source. However, given b/l carotid stenosis on ultrasound, will need to rule out large vessel disease source.   Code Stroke age indeterminate L basal ganglia lacune new since 2015.   MRI  Acute L basal ganglia infarct. Small vessel disease.   MRA  Unremarkable   Carotid Doppler  R 40-59%, L 40-59%  CTA neck pending  2D Echo EF 60-65%  LDL pending   HgbA1c pending   Lovenox 40 mg sq daily for VTE prophylaxis  No antithrombotic prior to admission, now on ASA 81 and plavix 83m DAPT.  Therapy recommendations:  SLP  Disposition:  pending   COVID infection  asymptomatic found on 11/29/2019.   D-Dimer 1.34, CRP 14.4, ESR 105  Repeat COVID test pending  Left UE  swelling - will do UE and LE venous doppler   Encephalopathy   Lethargy with disorientation, intermittent expressive aphasia, confusion  EEG pending  UA neg  afebrile  Could be related to hypoglycemia episode, hypokalemia   Hypertension  Stable . Permissive hypertension (OK if < 220/120) but gradually normalize in 3-5 days . Long-term BP goal normotensive  Hyperlipidemia  Home meds:  lopid, resumed in hospital  LDL pending, goal < 70  Will consider statin pending LDL  Diabetes type II Uncontrolled Frequent hypoglycemic events  HgbA1c pending , goal < 7.0  CBGs  SSI  Frequent hypoglycemic events - on D5NS - on diet   Other Stroke Risk Factors  Advanced age  Hx Cigarette smoker  Obesity, recommend weight loss, diet and exercise as appropriate   Other Active Problems  Hx cervical cancer s/p XRT 09/1993  Hyperthyroidism, Grave's dz s/p radioiodine - TSH pending  Pyelonephritis adm 1/12-1/15/2021 d/c on Augmentin - still on Augmentin - UA neg this admission  Hospital day # 1  I spent  35 minutes in total face-to-face time with the patient, more than 50% of which was spent in counseling and coordination of care, reviewing test results, images and medication, and discussing the diagnosis of stroke, encephalopathy, COVID, hypoglycemic events, treatment plan and potential prognosis. This patient's care requiresreview of multiple databases, neurological assessment, discussion with family, other specialists and medical decision making of high complexity. I have discussed with Dr. EWaldron Labs   JRosalin Hawking MD PhD Stroke Neurology 12/13/2019 6:21 PM   To contact  Stroke Continuity provider, please refer to [email or web address] After hours, contact General Neurology

## 2019-12-13 NOTE — Plan of Care (Signed)

## 2019-12-13 NOTE — Progress Notes (Signed)
Pt poor historian, admission document not completed .

## 2019-12-13 NOTE — Evaluation (Signed)
Speech Language Pathology Evaluation Patient Details Name: [name] MRN: [id number] DOB: 09-03-1946 Today's Date: 12/13/2019 Time: 3810-1751 SLP Time Calculation (min) (ACUTE ONLY): 23 min  Problem List:  Patient Active Problem List   Diagnosis Date Noted  . Altered mental status 12/12/2019  . Hypoglycemia 12/12/2019  . Acute UTI 11/29/2019  . Normocytic anemia 11/29/2019  . Nausea, vomiting, and diarrhea 07/27/2019  . Hypokalemia 07/27/2019  . Hypomagnesemia 07/27/2019  . History of cervical cancer 03/18/2019  . Sepsis ([place]) 05/04/2018  . Acute lower UTI 05/04/2018  . Uncontrolled type 2 diabetes mellitus with hyperglycemia ([place]) 05/04/2018  . ARF (acute renal failure) ([place]) 05/04/2018  . Essential hypertension 05/04/2018  . Bilateral lower extremity edema 07/31/2017  . MUSCLE CRAMPS 09/19/2010  . NUMBNESS 09/19/2010  . Hypothyroidism 06/06/2010  . THRUSH 11/29/2008  . Type 2 diabetes mellitus ([place]) 10/20/2008  . Hyperlipidemia associated with type 2 diabetes mellitus ([place]) 10/20/2008  . BRONCHITIS, ACUTE WITH BRONCHOSPASM 10/20/2008  . EMPHYSEMA 10/20/2008  . C O P D 10/20/2008  . CROHN'S DISEASE 10/20/2008  . TOBACCO ABUSE-HISTORY OF 10/20/2008   Past Medical History:  Past Medical History:  Diagnosis Date  . Arthritis   . Bilateral lower extremity edema   . Flank pain   . Full dentures   . History of cervical cancer    11/ 1994  Stage IB  s/p  high dose radiation brachytherapy @ [facility] 01/ 1995--- per pt no recurrence  . History of chronic bronchitis   . History of hyperthyroidism    d 03/ 2011---due to grave's disease--- s/p  RAI treatment 04/ 2011  . History of sepsis 05/03/2018   due to UTI with klebsiella/ pyelonephritis/ ureteral obstruction cause by stone  . Hyperlipidemia   . Hypothyroidism, postradioiodine therapy    endocrinologist--  dr Loanne Drilling--  dx graves disease and s/p RAI i131 treatement 4/ 2011  . Nauseated   . Oral thrush 05/03/2018  .  Pneumonia   . Type 2 diabetes mellitus treated with insulin ([place])    FOLLOWED BY PCP  . Urgency of urination   . Wears glasses    Past Surgical History:  Past Surgical History:  Procedure Laterality Date  . APPENDECTOMY    . CATARACT EXTRACTION W/ INTRAOCULAR LENS  IMPLANT, BILATERAL  2004  approx.  . CYSTOSCOPY WITH STENT PLACEMENT Right 05/04/2018   Procedure: CYSTOSCOPY WITH STENT PLACEMENT AND RETROGRADE PYELOGRAM;  Surgeon: Ceasar Mons, MD;  Location: [facility];  Service: Urology;  Laterality: Right;  . CYSTOSCOPY/URETEROSCOPY/HOLMIUM LASER/STENT PLACEMENT Right 05/19/2018   Procedure: CYSTOSCOPY, URETEROSCOPY/HOLMIUM LASER, STONE BASKETRY/ STENT EXCHANGE;  Surgeon: Ceasar Mons, MD;  Location: [facility]  Service: Urology;  Laterality: Right;  . EXCISION MASS LEFT CHEST WALL  09-20-2007   dr Grandville Silos  [facility]  . Radioactive Iodine Therapy     for thyroid  . REVISION TOTAL HIP ARTHROPLASTY Left early 2000s  . TANDEM RING INSERTION  1995   dr Aldean Ast @ [facility]   EUA w/ tandem placement in ovid  (for direct high dose radiation brachytherapy , cervical cancer)  . TOTAL HIP ARTHROPLASTY Left 1990s  . TRANSTHORACIC ECHOCARDIOGRAM  08/07/2017   mild focal basal hypertrophy of the septum,  ef 02-58%, grade 1 diastolic dysfunction/  atrial septum with lipomatous hypertrophy/  trivial PR   HPI:  [name] is a 74 y.o. female with past with history significant for insulin-dependent diabetes mellitus, hypertension, COPD with recent admission for pyelonephritis on 1/12,  incidentally found to be COVID-19 positive presents to the emergency department with altered mental status. According to the patient's family, patient has been having increasing fatigue since the last 2 weeks.  Patient was brought to the emergency department and noted to have a glucose of 42.  On assessment patient also noted to have right-sided weakness.  CT head was obtained which  showed a possible acute stroke in the left basal ganglia. Has  a history of mild difficulty swallowing dry solids, such as cornbread.    Assessment / Plan / Recommendation Clinical Impression  Pt demonstrates a moderate aphasia with expressive and receptive deficits as well as perseveration, decreased sustained attention and right visual inattention. Pt is about 50% accurate in yes no questions and one step commands, though accuracy of responses appears impacted by perseveration. During naming of familiar objects pt again about 50-75% accurate with incorrect responses including perseveration and "I dont know." Pt can read single words, but also has some substitutions and neglects right side of text. In spontaneous language tasks, pt struggles to intiaite and when expressing wants and needs demosntrates paraphasias without awareness. Recommend ongoing acute interventions for education and improved communication with staff. Recommend f/u at CIR.     SLP Assessment  SLP Recommendation/Assessment: Patient needs continued Speech Lanaguage Pathology Services SLP Visit Diagnosis: Aphasia (R47.01)    Follow Up Recommendations  Inpatient Rehab    Frequency and Duration min 2x/week  2 weeks      SLP Evaluation Cognition  Overall Cognitive Status: No family/caregiver present to determine baseline cognitive functioning Arousal/Alertness: Awake/alert Orientation Level: Oriented to person;Oriented to place Attention: Focused;Sustained Focused Attention: Appears intact Sustained Attention: Impaired Sustained Attention Impairment: Verbal basic;Functional basic Awareness: Impaired Awareness Impairment: Intellectual impairment Problem Solving: Impaired Problem Solving Impairment: Verbal basic;Functional basic       Comprehension  Auditory Comprehension Overall Auditory Comprehension: Impaired Yes/No Questions: Impaired Basic Biographical Questions: 26-50% accurate Basic Immediate Environment  Questions: 25-49% accurate Commands: Impaired One Step Basic Commands: 50-74% accurate Conversation: Simple Interfering Components: Attention;Pain;Visual impairments EffectiveTechniques: Visual/Gestural cues;Repetition;Extra processing time Reading Comprehension Reading Status: Impaired Word level: Impaired Sentence Level: Impaired    Expression Verbal Expression Overall Verbal Expression: Impaired Initiation: Impaired Automatic Speech: Counting;Day of week Level of Generative/Spontaneous Verbalization: Word;Phrase Repetition: No impairment Naming: Impairment Responsive: Not tested Confrontation: Impaired Convergent: Not tested Divergent: Not tested Verbal Errors: Phonemic paraphasias;Not aware of errors;Perseveration   Oral / Motor  Oral Motor/Sensory Function Overall Oral Motor/Sensory Function: Within functional limits Motor Speech Overall Motor Speech: Appears within functional limits for tasks assessed   GO                   Herbie Baltimore, MA CCC-SLP  Acute Rehabilitation Services Pager [phone] Office [phone]  Lynann Beaver 12/13/2019, 9:54 AM

## 2019-12-13 NOTE — Consult Note (Addendum)
Requesting Physician: Dr. Linda Hedges    Chief Complaint: Fatigue  History obtained from: Patient and Chart   HPI:                                                                                                                                       GOLDA ZAVALZA is a 74 y.o. female with past with history significant for insulin-dependent diabetes mellitus, hypertension, COPD with recent admission for pyelonephritis on 1/12, incidentally found to be COVID-19 positive presents to the emergency department with altered mental status.  According to the patient's family, patient has been having increasing fatigue since the last 2 weeks.  Patient was brought to the emergency department and noted to have a glucose of 42.  On assessment patient also noted to have right-sided weakness.  CT head was obtained which showed a possible acute stroke in the left basal ganglia.  Neurology was consulted for further recommendations.  Date last known well: Unclear tPA Given: outside window NIHSS: (1 LOC, 1 question, 3 RUE, 1 +1 BLE)  Baseline MRS 0    Past Medical History:  Diagnosis Date  . Arthritis   . Bilateral lower extremity edema   . Flank pain   . Full dentures   . History of cervical cancer    11/ 1994  Stage IB  s/p  high dose radiation brachytherapy @ [facility] 01/ 1995--- per pt no recurrence  . History of chronic bronchitis   . History of hyperthyroidism    d 03/ 2011---due to grave's disease--- s/p  RAI treatment 04/ 2011  . History of sepsis 05/03/2018   due to UTI with klebsiella/ pyelonephritis/ ureteral obstruction cause by stone  . Hyperlipidemia   . Hypothyroidism, postradioiodine therapy    endocrinologist--  dr Loanne Drilling--  dx graves disease and s/p RAI i131 treatement 4/ 2011  . Nauseated   . Oral thrush 05/03/2018  . Pneumonia   . Type 2 diabetes mellitus treated with insulin ([place])    FOLLOWED BY PCP  . Urgency of urination   . Wears glasses     Past Surgical History:  Procedure  Laterality Date  . APPENDECTOMY    . CATARACT EXTRACTION W/ INTRAOCULAR LENS  IMPLANT, BILATERAL  2004  approx.  . CYSTOSCOPY WITH STENT PLACEMENT Right 05/04/2018   Procedure: CYSTOSCOPY WITH STENT PLACEMENT AND RETROGRADE PYELOGRAM;  Surgeon: Ceasar Mons, MD;  Location: [facility];  Service: Urology;  Laterality: Right;  . CYSTOSCOPY/URETEROSCOPY/HOLMIUM LASER/STENT PLACEMENT Right 05/19/2018   Procedure: CYSTOSCOPY, URETEROSCOPY/HOLMIUM LASER, STONE BASKETRY/ STENT EXCHANGE;  Surgeon: Ceasar Mons, MD;  Location: [facility];  Service: Urology;  Laterality: Right;  . EXCISION MASS LEFT CHEST WALL  09-20-2007   dr Grandville Silos  [facility]  . Radioactive Iodine Therapy     for thyroid  . REVISION TOTAL HIP ARTHROPLASTY Left early 2000s  . [place]  dr Aldean Ast @ [facility]   EUA w/ tandem placement in ovid  (for direct high dose radiation brachytherapy , cervical cancer)  . TOTAL HIP ARTHROPLASTY Left 1990s  . TRANSTHORACIC ECHOCARDIOGRAM  08/07/2017   mild focal basal hypertrophy of the septum,  ef 29-92%, grade 1 diastolic dysfunction/  atrial septum with lipomatous hypertrophy/  trivial PR    Family History  Problem Relation Age of Onset  . Emphysema Father   . Diabetes Father   . Emphysema Sister   . Emphysema Brother   . Cancer Mother        Skin Cancer  . Diabetes Mother    Social History:  reports that she quit smoking about 37 years ago. Her smoking use included cigarettes. She quit after 5.00 years of use. She has never used smokeless tobacco. She reports that she does not drink alcohol or use drugs.  Allergies:  Allergies  Allergen Reactions  . Codeine Nausea Only    Medications:                                                                                                                        I reviewed home medications   ROS:                                                                                                                                      14 systems reviewed and negative except above    Examination:                                                                                                      General: Appears well-developed  Psych: Affect appropriate to situation Eyes: No scleral injection HENT: No OP obstrucion Head: Normocephalic.  Cardiovascular: Normal rate and regular rhythm.  Respiratory: Effort normal and breath sounds normal to anterior ascultation GI: Soft.  No distension. There is no tenderness.  Skin: WDI    Neurological Examination Mental Status: Patient is lethargic, but easily  arousable.  Answers questions appropriately and is alert and oriented to herself and age but not month.  She will drift off back to sleep while attempting to answer questions.  No significant dysarthria or aphasia.  Follows simple commands Cranial Nerves: II: Visual fields grossly normal,  III,IV, VI: ptosis not present, extra-ocular motions intact bilaterally, pupils equal, round, reactive to light and accommodation V,VII: smile symmetric, facial light touch sensation normal bilaterally VIII: hearing normal bilaterally IX,X: uvula rises symmetrically XI: bilateral shoulder shrug XII: midline tongue extension Motor: Right : Upper extremity   2/5    Left:     Upper extremity   5/5  Lower extremity   4/5     Lower extremity   4/5 Tone and bulk:normal tone throughout; no atrophy noted Sensory: Intact to light touch bilaterally Deep Tendon Reflexes: 2+ and symmetric throughout Plantars: Cerebellar: normal finger-to-nose      Lab Results: Basic Metabolic Panel: Recent Labs  Lab 12/12/19 1036 12/12/19 2234  NA 140  --   K 3.2*  --   CL 106  --   CO2 20*  --   GLUCOSE 42* 134*  BUN 11  --   CREATININE 0.73  --   CALCIUM 8.8*  --     CBC: Recent Labs  Lab 12/12/19 1036  WBC 7.8  NEUTROABS 6.3  HGB 11.7*  HCT 35.8*  MCV 85.6  PLT 292    Coagulation Studies: No  results for input(s): LABPROT, INR in the last 72 hours.  Imaging: CT Head Wo Contrast  Result Date: 12/12/2019 CLINICAL DATA:  74 year old female with altered mental status, increased lethargy for 1 week. Patient was recently hospitalized for UTI and COVID-19. EXAM: CT HEAD WITHOUT CONTRAST TECHNIQUE: Contiguous axial images were obtained from the base of the skull through the vertex without intravenous contrast. COMPARISON:  Head CT 05/27/2014. FINDINGS: Brain: Cerebral volume remains within normal limits for age. No midline shift, mass effect, or evidence of intracranial mass lesion. No ventriculomegaly. Confluent 15 millimeter hypodensity in the left basal ganglia is new since 2015 but age indeterminate. Scattered additional subcortical white matter hypodensity is mildly progressed since 2015. No acute intracranial hemorrhage identified. No cortically based acute infarct identified. Vascular: Calcified atherosclerosis at the skull base. No suspicious intracranial vascular hyperdensity. Skull: No acute osseous abnormality identified. Sinuses/Orbits: Visualized paranasal sinuses and mastoids are stable and well pneumatized. Small chronic left frontoethmoidal recess osteoma appear stable and inconsequential. Other: No acute orbit or scalp soft tissue findings. IMPRESSION: 1. Age indeterminate lacunar infarct in the left basal ganglia is new since 2015. Query recent right side weakness. 2. No other acute intracranial abnormality identified. Electronically Signed   By: Genevie Ann M.D.   On: 12/12/2019 15:30   MR ANGIO HEAD WO CONTRAST  Result Date: 12/13/2019 CLINICAL DATA:  Altered mental status EXAM: MRI HEAD WITHOUT CONTRAST MRA HEAD WITHOUT CONTRAST TECHNIQUE: Multiplanar, multiecho pulse sequences of the brain and surrounding structures were obtained without intravenous contrast. Angiographic images of the head were obtained using MRA technique without contrast. COMPARISON:  Head CT from yesterday  FINDINGS: MRI HEAD FINDINGS Brain: Wedge of restricted diffusion in the left basal ganglia spanning the upper putamen and caudate body via the corona radiata. Background of mild chronic small vessel ischemia. No hemorrhage, hydrocephalus, collection, or masslike finding Vascular: Normal flow voids.  Arterial findings below. Skull and upper cervical spine: Normal marrow signal. Sinuses/Orbits: Bilateral cataract resection. Other: Progressively motion degraded. MRA HEAD FINDINGS Unremarkable intracranial anatomy  with incomplete circle-of-Willis at the level of the posterior communicating arteries. Mild motion degradation. Tortuous carotid siphons with mild atherosclerotic type irregularity versus flow artifact. No branch occlusion or flow limiting stenosis. Negative for aneurysm. IMPRESSION: 1. Acute perforator infarct at the left basal ganglia. 2. Background mild chronic small vessel ischemia in the cerebral white matter. 3. Negative intracranial MRA. Electronically Signed   By: Monte Fantasia M.D.   On: 12/13/2019 05:03   MR BRAIN WO CONTRAST  Result Date: 12/13/2019 CLINICAL DATA:  Altered mental status EXAM: MRI HEAD WITHOUT CONTRAST MRA HEAD WITHOUT CONTRAST TECHNIQUE: Multiplanar, multiecho pulse sequences of the brain and surrounding structures were obtained without intravenous contrast. Angiographic images of the head were obtained using MRA technique without contrast. COMPARISON:  Head CT from yesterday FINDINGS: MRI HEAD FINDINGS Brain: Wedge of restricted diffusion in the left basal ganglia spanning the upper putamen and caudate body via the corona radiata. Background of mild chronic small vessel ischemia. No hemorrhage, hydrocephalus, collection, or masslike finding Vascular: Normal flow voids.  Arterial findings below. Skull and upper cervical spine: Normal marrow signal. Sinuses/Orbits: Bilateral cataract resection. Other: Progressively motion degraded. MRA HEAD FINDINGS Unremarkable intracranial  anatomy with incomplete circle-of-Willis at the level of the posterior communicating arteries. Mild motion degradation. Tortuous carotid siphons with mild atherosclerotic type irregularity versus flow artifact. No branch occlusion or flow limiting stenosis. Negative for aneurysm. IMPRESSION: 1. Acute perforator infarct at the left basal ganglia. 2. Background mild chronic small vessel ischemia in the cerebral white matter. 3. Negative intracranial MRA. Electronically Signed   By: Monte Fantasia M.D.   On: 12/13/2019 05:03   CT Abdomen Pelvis W Contrast  Result Date: 12/12/2019 CLINICAL DATA:  Altered mental status EXAM: CT ABDOMEN AND PELVIS WITH CONTRAST TECHNIQUE: Multidetector CT imaging of the abdomen and pelvis was performed using the standard protocol following bolus administration of intravenous contrast. CONTRAST:  186m OMNIPAQUE IOHEXOL 300 MG/ML  SOLN COMPARISON:  11/29/2019 noncontrast study FINDINGS: Lower chest: Partially imaged bibasilar peripheral opacities, which may reflect atelectasis or recent COVID infection. Hepatobiliary: No focal liver abnormality is seen. No gallstones, gallbladder wall thickening, or biliary dilatation. Pancreas: Unremarkable. Spleen: Unremarkable. Adrenals/Urinary Tract: Bladder is mildly distended and partially obscured by artifact from hip arthroplasty. The distal ureters are also not well seen. Otherwise, fullness of the ureters and renal collecting systems, which may related to bladder distension. Adrenals are normal. Stomach/Bowel: Bowel is normal in caliber. Colon is primarily fluid-filled. Appendix is not visualized. Vascular/Lymphatic: Marked aortoiliac and branch atherosclerosis. No adenopathy. Reproductive: Endometrial canal remains distended with fluid. No adnexal mass. Other: No new abdominal wall finding.  No ascites. Musculoskeletal: Left hip arthroplasty is present with associated streak artifact. Degenerative changes of the spine. No acute osseous  abnormality IMPRESSION: Mild fullness renal collecting systems and proximal to mid ureters, which may related to bladder distension. Normal caliber but primarily fluid-filled colon. Stable findings of hematometra. Electronically Signed   By: PMacy MisM.D.   On: 12/12/2019 15:39   DG Chest Port 1 View  Result Date: 12/12/2019 CLINICAL DATA:  COVID, altered mental status EXAM: PORTABLE CHEST 1 VIEW COMPARISON:  2019 FINDINGS: Interstitial prominence, likely chronic. No pleural effusion or pneumothorax. Stable cardiomediastinal contours with normal heart size. IMPRESSION: Mild interstitial prominence, which appears chronic. Electronically Signed   By: PMacy MisM.D.   On: 12/12/2019 10:59     ASSESSMENT AND PLAN  74y.o. female with past with history significant for insulin-dependent diabetes mellitus, hypertension,  COPD with recent admission for pyelonephritis on 1/12, incidentally found to be COVID-19 positive presents to the emergency department with altered mental status, increasing fatigue and right-sided weakness.  Fatigue in the setting of hypoglycemia Acute left basal ganglia infarction Possible COVID-19 related encephalopathy  Risk factors: Type 2 diabetes mellitus, hypertension, Etiology: Likely small vessel disease, less likely COVID -19   Recommendations # MRI of the brain without contrast : Acute left basal ganglia infarction #MRA Head and carotid Doppler: no ICA stenosis/occlusion #Transthoracic Echo  # Start patient on ASA 350m daily #Start or continue Atorvastatin 40 mg/other high intensity statin # BP goal: permissive HTN upto 220/120 mmHg # HBAIC and Lipid profile # Telemetry monitoring # Frequent neuro checks # Stroke swallow screen # Check ESR, CRP  Please page stroke NP  Or  PA  Or MD from 8am -4 pm  as this patient from this time will be  followed by the stroke.   You can look them up on [email or web address]  Password [facility]  Maxie Slovacek [facility] Pager Number [phone]\

## 2019-12-13 NOTE — Progress Notes (Signed)
Pt's daughter updated at this time.

## 2019-12-13 NOTE — Progress Notes (Signed)
Patient's right hand is swollen. Nurse elevated hand. Daughter was very worried about swelling. Dr. Waldron Labs notified. [place]

## 2019-12-13 NOTE — Progress Notes (Signed)
  Echocardiogram 2D Echocardiogram has been performed.  [name] A [name] 12/13/2019, 1:29 PM

## 2019-12-13 NOTE — Evaluation (Signed)
Clinical/Bedside Swallow Evaluation Patient Details  Name: [name] MRN: [id number] Date of Birth: 10/07/1946  Today's Date: 12/13/2019 Time: SLP Start Time (ACUTE ONLY): 0900 SLP Stop Time (ACUTE ONLY): 0923 SLP Time Calculation (min) (ACUTE ONLY): 23 min  Past Medical History:  Past Medical History:  Diagnosis Date  . Arthritis   . Bilateral lower extremity edema   . Flank pain   . Full dentures   . History of cervical cancer    11/ 1994  Stage IB  s/p  high dose radiation brachytherapy @ [facility] 01/ 1995--- per pt no recurrence  . History of chronic bronchitis   . History of hyperthyroidism    d 03/ 2011---due to grave's disease--- s/p  RAI treatment 04/ 2011  . History of sepsis 05/03/2018   due to UTI with klebsiella/ pyelonephritis/ ureteral obstruction cause by stone  . Hyperlipidemia   . Hypothyroidism, postradioiodine therapy    endocrinologist--  dr Loanne Drilling--  dx graves disease and s/p RAI i131 treatement 4/ 2011  . Nauseated   . Oral thrush 05/03/2018  . Pneumonia   . Type 2 diabetes mellitus treated with insulin ([place])    FOLLOWED BY PCP  . Urgency of urination   . Wears glasses    Past Surgical History:  Past Surgical History:  Procedure Laterality Date  . APPENDECTOMY    . CATARACT EXTRACTION W/ INTRAOCULAR LENS  IMPLANT, BILATERAL  2004  approx.  . CYSTOSCOPY WITH STENT PLACEMENT Right 05/04/2018   Procedure: CYSTOSCOPY WITH STENT PLACEMENT AND RETROGRADE PYELOGRAM;  Surgeon: Ceasar Mons, MD;  Location: [facility];  Service: Urology;  Laterality: Right;  . CYSTOSCOPY/URETEROSCOPY/HOLMIUM LASER/STENT PLACEMENT Right 05/19/2018   Procedure: CYSTOSCOPY, URETEROSCOPY/HOLMIUM LASER, STONE BASKETRY/ STENT EXCHANGE;  Surgeon: Ceasar Mons, MD;  Location: [facility];  Service: Urology;  Laterality: Right;  . EXCISION MASS LEFT CHEST WALL  09-20-2007   dr Grandville Silos  [facility]  . Radioactive Iodine Therapy     for thyroid  . REVISION  TOTAL HIP ARTHROPLASTY Left early 2000s  . TANDEM RING INSERTION  1995   dr Aldean Ast @ [facility]   EUA w/ tandem placement in ovid  (for direct high dose radiation brachytherapy , cervical cancer)  . TOTAL HIP ARTHROPLASTY Left 1990s  . TRANSTHORACIC ECHOCARDIOGRAM  08/07/2017   mild focal basal hypertrophy of the septum,  ef 76-16%, grade 1 diastolic dysfunction/  atrial septum with lipomatous hypertrophy/  trivial PR   HPI:  [name] is a 74 y.o. female with past with history significant for insulin-dependent diabetes mellitus, hypertension, COPD with recent admission for pyelonephritis on 1/12, incidentally found to be COVID-19 positive presents to the emergency department with altered mental status. According to the patient's family, patient has been having increasing fatigue since the last 2 weeks.  Patient was brought to the emergency department and noted to have a glucose of 42.  On assessment patient also noted to have right-sided weakness.  CT head was obtained which showed a possible acute stroke in the left basal ganglia. Has  a history of mild difficulty swallowing dry solids, such as cornbread.    Assessment / Plan / Recommendation Clinical Impression  Pt demonstrates no sign of acute dysphagia due to stroke; no oral neuromuscular weakness appreciated, takes consecutive straw sips without difficulty. Pt is edentulous and a little weak and confused, does not reposition herself well. Had some coughing when trying to masticate a graham cracker. Recommend a ground texture with thin liquids  until appropriate self feeding and mastication is achieved. Will f/u for advancement.  SLP Visit Diagnosis: Dysphagia, unspecified (R13.10)    Aspiration Risk  Mild aspiration risk    Diet Recommendation Dysphagia 2 (Fine chop);Thin liquid   Liquid Administration via: Cup;Straw Medication Administration: Whole meds with liquid Supervision: Staff to assist with self feeding Compensations:  Slow rate;Small sips/bites Postural Changes: Seated upright at 90 degrees    Other  Recommendations Oral Care Recommendations: Oral care BID   Follow up Recommendations Inpatient Rehab      Frequency and Duration min 2x/week  2 weeks       Prognosis Prognosis for Safe Diet Advancement: Good      Swallow Study   General HPI: [name] is a 74 y.o. female with past with history significant for insulin-dependent diabetes mellitus, hypertension, COPD with recent admission for pyelonephritis on 1/12, incidentally found to be COVID-19 positive presents to the emergency department with altered mental status. According to the patient's family, patient has been having increasing fatigue since the last 2 weeks.  Patient was brought to the emergency department and noted to have a glucose of 42.  On assessment patient also noted to have right-sided weakness.  CT head was obtained which showed a possible acute stroke in the left basal ganglia. Has  a history of mild difficulty swallowing dry solids, such as cornbread.  Type of Study: Bedside Swallow Evaluation Previous Swallow Assessment: see HPI Diet Prior to this Study: NPO Temperature Spikes Noted: No Respiratory Status: Room air History of Recent Intubation: No Behavior/Cognition: Alert;Cooperative Oral Cavity Assessment: Within Functional Limits Oral Care Completed by SLP: No Oral Cavity - Dentition: Edentulous Self-Feeding Abilities: Able to feed self Patient Positioning: Upright in bed Baseline Vocal Quality: Normal Volitional Cough: Strong Volitional Swallow: Able to elicit    Oral/Motor/Sensory Function Overall Oral Motor/Sensory Function: Within functional limits   Ice Chips     Thin Liquid Thin Liquid: Within functional limits Presentation: Straw    Nectar Thick Nectar Thick Liquid: Not tested   Honey Thick Honey Thick Liquid: Not tested   Puree Puree: Within functional limits   Solid     Solid: Impaired Oral Phase  Impairments: Impaired mastication Pharyngeal Phase Impairments: Cough - Immediate     Herbie Baltimore, MA CCC-SLP  Acute Rehabilitation Services Pager [phone] Office [phone]  Lynann Beaver 12/13/2019,9:44 AM

## 2019-12-13 NOTE — Progress Notes (Signed)
Carotid duplex study completed.  Preliminary results can be found under CV proc under chart review.  12/13/2019 12:23 PM  Eldridge Marcott, K., RDMS, RVT

## 2019-12-13 NOTE — Progress Notes (Addendum)
PROGRESS NOTE                                                                                                                                                                                                             Patient Demographics:    [name], is a 74 y.o. female, DOB - [birth year], URK:[id number]  Admit date - 12/12/2019   Admitting Physician Neena Rhymes, MD  Outpatient Primary MD for the patient is Everardo Beals, NP  LOS - 1   Chief Complaint  Patient presents with  . Weakness  . Altered Mental Status       Brief Narrative    74 y.o. female with medical history of  insulin-dependent type 2 diabetes, hypertension, COPD, Crohn's disease, presenting to the emergency department from home via EMS with altered mental status. Patient was recently admitted to the hospital on 11/29/2019 and discharged on 12/02/2019 for pyelonephritis.She was also noted to be Covid positive incidentally though asymptomatic and was not treated. She was discharged on 2-week course of Augmentin, patient presents to ED secondary to increased confusion and weakness, she was noted to be altered, hypoglycemic with a glucose of 42, as well she was noted to have right-sided weakness, CT head/MRI significant for acute CVA, she was admitted for further work-up.   Subjective:    [name] today is confused, she herself denies any complaints today, she is poor historian , but no significant events as discussed with staff .   Assessment  & Plan :    Active Problems:   Hypothyroidism   Type 2 diabetes mellitus ([place])   Hyperlipidemia associated with type 2 diabetes mellitus ([place])   EMPHYSEMA   Essential hypertension   Altered mental status   Hypoglycemia   Acute CVA - MRI was significant for acute left basal ganglia infarct, and small vessel disease,  -Has right-sided weakness -Carotid Doppler  R 40-59%, L 40-59%,  -2D echo is pending, - follow  on LDL, A1c,  -On aspirin and Plavix per neuro -no evidence of A. fib on telemetry, discussed with cardiology -PT/OT/SLP consulted.  COVID-19 infection -She tested +1/12, this x-ray with no evidence of multifocal opacities, she is not hypoxic, she denies any respiratory symptoms, she is more than 10 days of her positive results, no indication to place on isolation. -Patient with right arm  swelling, and elevated D-dimers, will obtain venous Doppler, and right upper extremity Doppler to rule out DVT  COVID-19 Labs  Recent Labs    12/13/19 0734  DDIMER 1.34*  CRP 14.4*    Lab Results  Component Value Date   SARSCOV2NAA POSITIVE (A) 11/29/2019   [place] NEGATIVE 07/27/2019   Hypertension -Allow for permissive hypertension in the setting of acute CVA.  Hyperlipidemia -Follow LDL, and consider statin if elevated  History of pyelonephritis/Klebsiella bacteremia -With recent hospitalization, continue with Augmentin.  Stop date 1/29  Diabetes mellitus -He presents with hypoglycemia initially -Follow A1c -Continue with insulin sliding scale   Code Status : Full  Family Communication  : D/W daughter and granddaughter via phone.  Disposition Plan  : Home with Home health  Barriers For Discharge : CVA work-up still pending  Consults  :  Neurology  Procedures  : None  DVT Prophylaxis  :  [place] lovenox  Lab Results  Component Value Date   PLT 292 12/12/2019    Antibiotics  :   Anti-infectives (From admission, onward)   Start     Dose/Rate Route Frequency Ordered Stop   12/12/19 2200  amoxicillin-clavulanate (AUGMENTIN) 875-125 MG per tablet 1 tablet     1 tablet Oral 2 times daily 12/12/19 1800 12/17/19 0959        Objective:   Vitals:   12/12/19 2356 12/13/19 0358 12/13/19 0803 12/13/19 1003  BP: (!) 150/58 (!) 150/60 (!) 143/48 138/61  Pulse: 69 66 61 73  Resp: 16 18 14 13   Temp: 98 F (36.7 C) 98.3 F (36.8 C) 98.5 F (36.9 C)   TempSrc: Oral Oral  Oral   SpO2: 97% 95% 98% 97%    Wt Readings from Last 3 Encounters:  11/29/19 74.8 kg  07/29/19 74.8 kg  07/27/19 74 kg     Intake/Output Summary (Last 24 hours) at 12/13/2019 1258 Last data filed at 12/13/2019 0407 Gross per 24 hour  Intake 1500 ml  Output --  Net 1500 ml     Physical Exam  Awake Alert, confused, easily distracted Symmetrical Chest wall movement, Good air movement bilaterally, CTAB RRR,No Gallops,Rubs or new Murmurs, No Parasternal Heave +ve B.Sounds, Abd Soft, No tenderness,  No rebound - guarding or rigidity. No Cyanosis, Clubbing or edema, No new Rash or bruise      Data Review:    CBC Recent Labs  Lab 12/12/19 1036  WBC 7.8  HGB 11.7*  HCT 35.8*  PLT 292  MCV 85.6  MCH 28.0  MCHC 32.7  RDW 13.3  LYMPHSABS 0.7  MONOABS 0.7  EOSABS 0.0  BASOSABS 0.0    Chemistries  Recent Labs  Lab 12/12/19 1036 12/12/19 2234 12/13/19 0246 12/13/19 0640 12/13/19 0655  NA 140  --  141 144  --   K 3.2*  --  2.5* 2.6*  --   CL 106  --  107 109  --   CO2 20*  --  21* 22  --   GLUCOSE 42* 134* 37* 119*  --   BUN 11  --  7* 8  --   CREATININE 0.73  --  0.67 0.73  --   CALCIUM 8.8*  --  8.5* 8.5*  --   MG  --   --   --   --  1.4*  AST 26  --   --   --   --   ALT 18  --   --   --   --  ALKPHOS 161*  --   --   --   --   BILITOT 0.5  --   --   --   --    ------------------------------------------------------------------------------------------------------------------ No results for input(s): CHOL, HDL, LDLCALC, TRIG, CHOLHDL, LDLDIRECT in the last 72 hours.  Lab Results  Component Value Date   HGBA1C 6.1 (H) 07/27/2019   ------------------------------------------------------------------------------------------------------------------ No results for input(s): TSH, T4TOTAL, T3FREE, THYROIDAB in the last 72 hours.  Invalid input(s):  FREET3 ------------------------------------------------------------------------------------------------------------------ No results for input(s): VITAMINB12, FOLATE, FERRITIN, TIBC, IRON, RETICCTPCT in the last 72 hours.  Coagulation profile No results for input(s): INR, PROTIME in the last 168 hours.  Recent Labs    12/13/19 0734  DDIMER 1.34*    Cardiac Enzymes No results for input(s): CKMB, TROPONINI, MYOGLOBIN in the last 168 hours.  Invalid input(s): CK ------------------------------------------------------------------------------------------------------------------ No results found for: BNP  Inpatient Medications  Scheduled Meds: . amoxicillin-clavulanate  1 tablet Oral BID  . enoxaparin (LOVENOX) injection  40 mg Subcutaneous Q24H  . gabapentin  900 mg Oral BID  . gemfibrozil  600 mg Oral BID  . levothyroxine  125 mcg Oral QAC breakfast  . methocarbamol  500 mg Oral BID  . oxybutynin  10 mg Oral QHS   Continuous Infusions: . dextrose 5 % and 0.9% NaCl     PRN Meds:.acetaminophen, baclofen  Micro Results Recent Results (from the past 240 hour(s))  Culture, blood (routine x 2)     Status: None (Preliminary result)   Collection Time: 12/12/19 11:00 AM   Specimen: BLOOD RIGHT WRIST  Result Value Ref Range Status   Specimen Description BLOOD RIGHT WRIST  Final   Special Requests   Final    BOTTLES DRAWN AEROBIC AND ANAEROBIC Blood Culture results may not be optimal due to an inadequate volume of blood received in culture bottles   Culture   Final    NO GROWTH < 24 HOURS Performed at [facility], [place] [address], [place], [place] [address]    Report Status PENDING  Incomplete  Culture, blood (routine x 2)     Status: None (Preliminary result)   Collection Time: 12/12/19 11:31 AM   Specimen: BLOOD LEFT HAND  Result Value Ref Range Status   Specimen Description BLOOD LEFT HAND  Final   Special Requests   Final    BOTTLES DRAWN AEROBIC ONLY Blood Culture  results may not be optimal due to an inadequate volume of blood received in culture bottles   Culture   Final    NO GROWTH < 24 HOURS Performed at [facility], [place] [address] [place], [place] [address]    Report Status PENDING  Incomplete  Urine culture     Status: None (Preliminary result)   Collection Time: 12/12/19  2:07 PM   Specimen: Urine, Random  Result Value Ref Range Status   Specimen Description URINE, RANDOM  Final   Special Requests NONE  Final   Culture   Final    NO GROWTH Performed at [facility], [place] [address], [place], [place] [address]    Report Status PENDING  Incomplete    Radiology Reports CT Head Wo Contrast  Result Date: 12/12/2019 CLINICAL DATA:  74 year old female with altered mental status, increased lethargy for 1 week. Patient was recently hospitalized for UTI and COVID-19. EXAM: CT HEAD WITHOUT CONTRAST TECHNIQUE: Contiguous axial images were obtained from the base of the skull through the vertex without intravenous contrast. COMPARISON:  Head CT  05/27/2014. FINDINGS: Brain: Cerebral volume remains within normal limits for age. No midline shift, mass effect, or evidence of intracranial mass lesion. No ventriculomegaly. Confluent 15 millimeter hypodensity in the left basal ganglia is new since 2015 but age indeterminate. Scattered additional subcortical white matter hypodensity is mildly progressed since 2015. No acute intracranial hemorrhage identified. No cortically based acute infarct identified. Vascular: Calcified atherosclerosis at the skull base. No suspicious intracranial vascular hyperdensity. Skull: No acute osseous abnormality identified. Sinuses/Orbits: Visualized paranasal sinuses and mastoids are stable and well pneumatized. Small chronic left frontoethmoidal recess osteoma appear stable and inconsequential. Other: No acute orbit or scalp soft tissue findings. IMPRESSION: 1. Age indeterminate lacunar infarct in the left basal ganglia is  new since 2015. Query recent right side weakness. 2. No other acute intracranial abnormality identified. Electronically Signed   By: Genevie Ann M.D.   On: 12/12/2019 15:30   MR ANGIO HEAD WO CONTRAST  Result Date: 12/13/2019 CLINICAL DATA:  Altered mental status EXAM: MRI HEAD WITHOUT CONTRAST MRA HEAD WITHOUT CONTRAST TECHNIQUE: Multiplanar, multiecho pulse sequences of the brain and surrounding structures were obtained without intravenous contrast. Angiographic images of the head were obtained using MRA technique without contrast. COMPARISON:  Head CT from yesterday FINDINGS: MRI HEAD FINDINGS Brain: Wedge of restricted diffusion in the left basal ganglia spanning the upper putamen and caudate body via the corona radiata. Background of mild chronic small vessel ischemia. No hemorrhage, hydrocephalus, collection, or masslike finding Vascular: Normal flow voids.  Arterial findings below. Skull and upper cervical spine: Normal marrow signal. Sinuses/Orbits: Bilateral cataract resection. Other: Progressively motion degraded. MRA HEAD FINDINGS Unremarkable intracranial anatomy with incomplete circle-of-Willis at the level of the posterior communicating arteries. Mild motion degradation. Tortuous carotid siphons with mild atherosclerotic type irregularity versus flow artifact. No branch occlusion or flow limiting stenosis. Negative for aneurysm. IMPRESSION: 1. Acute perforator infarct at the left basal ganglia. 2. Background mild chronic small vessel ischemia in the cerebral white matter. 3. Negative intracranial MRA. Electronically Signed   By: Monte Fantasia M.D.   On: 12/13/2019 05:03   MR BRAIN WO CONTRAST  Result Date: 12/13/2019 CLINICAL DATA:  Altered mental status EXAM: MRI HEAD WITHOUT CONTRAST MRA HEAD WITHOUT CONTRAST TECHNIQUE: Multiplanar, multiecho pulse sequences of the brain and surrounding structures were obtained without intravenous contrast. Angiographic images of the head were obtained using  MRA technique without contrast. COMPARISON:  Head CT from yesterday FINDINGS: MRI HEAD FINDINGS Brain: Wedge of restricted diffusion in the left basal ganglia spanning the upper putamen and caudate body via the corona radiata. Background of mild chronic small vessel ischemia. No hemorrhage, hydrocephalus, collection, or masslike finding Vascular: Normal flow voids.  Arterial findings below. Skull and upper cervical spine: Normal marrow signal. Sinuses/Orbits: Bilateral cataract resection. Other: Progressively motion degraded. MRA HEAD FINDINGS Unremarkable intracranial anatomy with incomplete circle-of-Willis at the level of the posterior communicating arteries. Mild motion degradation. Tortuous carotid siphons with mild atherosclerotic type irregularity versus flow artifact. No branch occlusion or flow limiting stenosis. Negative for aneurysm. IMPRESSION: 1. Acute perforator infarct at the left basal ganglia. 2. Background mild chronic small vessel ischemia in the cerebral white matter. 3. Negative intracranial MRA. Electronically Signed   By: Monte Fantasia M.D.   On: 12/13/2019 05:03   CT Abdomen Pelvis W Contrast  Result Date: 12/12/2019 CLINICAL DATA:  Altered mental status EXAM: CT ABDOMEN AND PELVIS WITH CONTRAST TECHNIQUE: Multidetector CT imaging of the abdomen and pelvis was performed using the standard protocol following  bolus administration of intravenous contrast. CONTRAST:  120m OMNIPAQUE IOHEXOL 300 MG/ML  SOLN COMPARISON:  11/29/2019 noncontrast study FINDINGS: Lower chest: Partially imaged bibasilar peripheral opacities, which may reflect atelectasis or recent COVID infection. Hepatobiliary: No focal liver abnormality is seen. No gallstones, gallbladder wall thickening, or biliary dilatation. Pancreas: Unremarkable. Spleen: Unremarkable. Adrenals/Urinary Tract: Bladder is mildly distended and partially obscured by artifact from hip arthroplasty. The distal ureters are also not well seen.  Otherwise, fullness of the ureters and renal collecting systems, which may related to bladder distension. Adrenals are normal. Stomach/Bowel: Bowel is normal in caliber. Colon is primarily fluid-filled. Appendix is not visualized. Vascular/Lymphatic: Marked aortoiliac and branch atherosclerosis. No adenopathy. Reproductive: Endometrial canal remains distended with fluid. No adnexal mass. Other: No new abdominal wall finding.  No ascites. Musculoskeletal: Left hip arthroplasty is present with associated streak artifact. Degenerative changes of the spine. No acute osseous abnormality IMPRESSION: Mild fullness renal collecting systems and proximal to mid ureters, which may related to bladder distension. Normal caliber but primarily fluid-filled colon. Stable findings of hematometra. Electronically Signed   By: PMacy MisM.D.   On: 12/12/2019 15:39   DG Chest Port 1 View  Result Date: 12/12/2019 CLINICAL DATA:  COVID, altered mental status EXAM: PORTABLE CHEST 1 VIEW COMPARISON:  2019 FINDINGS: Interstitial prominence, likely chronic. No pleural effusion or pneumothorax. Stable cardiomediastinal contours with normal heart size. IMPRESSION: Mild interstitial prominence, which appears chronic. Electronically Signed   By: PMacy MisM.D.   On: 12/12/2019 10:59   CT RENAL STONE STUDY  Result Date: 11/29/2019 CLINICAL DATA:  Flank pain with kidney stone suspected EXAM: CT ABDOMEN AND PELVIS WITHOUT CONTRAST TECHNIQUE: Multidetector CT imaging of the abdomen and pelvis was performed following the standard protocol without IV contrast. COMPARISON:  07/26/2019 and pelvis MRI 06/29/2019 FINDINGS: Lower chest:  No contributory findings. Hepatobiliary: No focal liver abnormality.No evidence of biliary obstruction or stone. Pancreas: Generalized atrophy. Spleen: Unremarkable. Adrenals/Urinary Tract: Negative adrenals. No hydronephrosis or stone. Asymmetric right renal cortical thinning and lobulation attributed to  scarring. Unremarkable bladder. Stomach/Bowel: No obstruction. No appendicitis. Formed stool throughout the colon. Vascular/Lymphatic: No acute vascular abnormality. Atherosclerotic calcification. No mass or adenopathy. Reproductive:Low-density dilatation of the endometrial cavity with coarse calcification at the level of the cervix, hematometra by pelvis MRI. No acute or interval finding. Other: No ascites or pneumoperitoneum. Musculoskeletal: No acute abnormalities. Left hip arthroplasty. Lumbar spine degeneration with L4-5 anterolisthesis. IMPRESSION: 1. No acute finding. 2. Hematometra as evaluated on recent pelvis MRI. 3. Right renal cortical scarring. 4.  Aortic Atherosclerosis (ICD10-I70.0). Electronically Signed   By: JMonte FantasiaM.D.   On: 11/29/2019 07:20   [facility] [place]  Result Date: 12/13/2019 Carotid Arterial Duplex Study Indications:       CVA. Risk Factors:      Hypertension, hyperlipidemia, Diabetes. Other Factors:     COPD. Comparison Study:  No prior study. Performing Technologist: KBaldwin CrownARDMS, RVT  Examination Guidelines: A complete evaluation includes B-mode imaging, spectral Doppler, color Doppler, and power Doppler as needed of all accessible portions of each vessel. Bilateral testing is considered an integral part of a complete examination. Limited examinations for reoccurring indications may be performed as noted.  Right Carotid Findings: +----------+--------+--------+--------+------------------+--------+           PSV cm/sEDV cm/sStenosisPlaque DescriptionComments +----------+--------+--------+--------+------------------+--------+ CCA Prox  91      14                                         +----------+--------+--------+--------+------------------+--------+  CCA Distal81      13                                         +----------+--------+--------+--------+------------------+--------+ ICA Prox  204     38      40-59%   heterogenous               +----------+--------+--------+--------+------------------+--------+ ICA Mid   162     25                                         +----------+--------+--------+--------+------------------+--------+ ICA Distal199     27                                         +----------+--------+--------+--------+------------------+--------+ ECA       276     18              heterogenous               +----------+--------+--------+--------+------------------+--------+ +----------+--------+-------+----------------+-------------------+           PSV cm/sEDV cmsDescribe        Arm Pressure (mmHG) +----------+--------+-------+----------------+-------------------+ [id number]            Multiphasic, WNL                    +----------+--------+-------+----------------+-------------------+ +---------+--------+--+--------+--+---------+ VertebralPSV cm/s74EDV cm/s14Antegrade +---------+--------+--+--------+--+---------+  Left Carotid Findings: +----------+--------+--------+--------+------------------+--------+           PSV cm/sEDV cm/sStenosisPlaque DescriptionComments +----------+--------+--------+--------+------------------+--------+ CCA Prox  119     17                                         +----------+--------+--------+--------+------------------+--------+ CCA Distal164     20                                         +----------+--------+--------+--------+------------------+--------+ ICA Prox  180     29      40-59%  heterogenous               +----------+--------+--------+--------+------------------+--------+ ICA Mid   149     21                                         +----------+--------+--------+--------+------------------+--------+ ICA Distal129     23                                         +----------+--------+--------+--------+------------------+--------+ ECA       187     21              heterogenous                +----------+--------+--------+--------+------------------+--------+ +----------+--------+--------+--------+-------------------+           PSV cm/sEDV cm/sDescribeArm Pressure (mmHG) +----------+--------+--------+--------+-------------------+ [id number]  Elevated                    +----------+--------+--------+--------+-------------------+ +---------+--------+--+--------+--+---------+ VertebralPSV cm/s53EDV cm/s10Antegrade +---------+--------+--+--------+--+---------+   Summary: Right Carotid: Velocities in the right ICA are consistent with a 40-59%                stenosis. Left Carotid: Velocities in the left ICA are consistent with a 40-59% stenosis. Vertebrals:  Bilateral vertebral arteries demonstrate antegrade flow. Subclavians: Left subclavian artery flow was elevated, but did not appear              stenotic. Normal flow hemodynamics were seen in the right              subclavian artery. *See table(s) above for measurements and observations.     Preliminary    ECHOCARDIOGRAM LIMITED  Result Date: 12/02/2019   ECHOCARDIOGRAM LIMITED REPORT   Patient Name:   RENATA GAMBINO Date of Exam: 12/02/2019 Medical Rec #:  [id number]        Height:       61.0 in Accession #:    [id number]       Weight:       165.0 lb Date of Birth:  12/04/1945        BSA:          1.74 m Patient Age:    7 years         BP:           153/78 mmHg Patient Gender: F                HR:           70 bpm. Exam Location:  Inpatient  Procedure: Limited Echo, Limited Color Doppler and Cardiac Doppler Indications:    Bacteremia 790.7 / R78.81  History:        Patient has prior history of Echocardiogram examinations, most                 recent 08/07/2017. COVID 19. bilateral lower extremity edema.                 thyroid disease.  Sonographer:    Darlina Sicilian RDCS Referring Phys: [id number] [place]  1. Left ventricular ejection fraction, by visual estimation, is 60 to 65%. The left  ventricle has normal function.  2. Global right ventricle has normal systolic function.The right ventricular size is normal.  3. The mitral valve is normal in structure. No evidence of mitral valve regurgitation.  4. The tricuspid valve was normal in structure. Tricuspid valve regurgitation is not demonstrated.  5. The aortic valve is tricuspid. Aortic valve regurgitation is not visualized. The aortic valve is structurally normal, with no evidence of sclerosis or stenosis.  6. The pulmonic valve was not well visualized. Pulmonic valve regurgitation is not visualized by color flow Doppler.  7. The inferior vena cava is normal in size with greater than 50% respiratory variability, suggesting right atrial pressure of 3 mmHg.  8. No vegetation seen. If high clinical suspicion for endocarditis, recommend TEE FINDINGS  Left Ventricle: Left ventricular ejection fraction, by visual estimation, is 60 to 65%. The left ventricle has normal function. Right Ventricle: The right ventricular size is normal. Global RV systolic function is has normal systolic function. Pericardium: There is no evidence of pericardial effusion is seen. There is no evidence of pericardial effusion. Mitral Valve: The mitral valve is normal in structure. MV Area by PHT, 4.96  cm. MV PHT, 44.37 msec. No evidence of mitral valve regurgitation. Tricuspid Valve: The tricuspid valve is normal in structure. Tricuspid valve regurgitation is not demonstrated. Aortic Valve: The aortic valve is tricuspid. Aortic valve regurgitation is not visualized. The aortic valve is structurally normal, with no evidence of sclerosis or stenosis. Pulmonic Valve: The pulmonic valve was not well visualized. Pulmonic valve regurgitation is not visualized by color flow Doppler. Pulmonic regurgitation is not visualized by color flow Doppler. Venous: The inferior vena cava is normal in size with greater than 50% respiratory variability, suggesting right atrial pressure of 3 mmHg.   LEFT VENTRICLE          Normals PLAX 2D LVOT diam:     1.60 cm  2.0 cm   Diastology                 Normals LVOT Area:     2.01 cm 3.14 cm2 LV e' lateral:   4.13 cm/s 6.42 cm/s                                  LV E/e' lateral: 13.9      15.4                                  LV e' medial:    4.90 cm/s 6.96 cm/s                                  LV E/e' medial:  11.7      6.96  AORTIC VALVE             Normals LVOT Vmax:   84.20 cm/s LVOT Vmean:  55.500 cm/s 75 cm/s LVOT VTI:    0.140 m     25.3 cm MITRAL VALVE              Normals MV Area (PHT): 4.96 cm             SHUNTS MV PHT:        44.37 msec 55 ms     Systemic VTI:  0.14 m MV Decel Time: 153 msec   187 ms    Systemic Diam: 1.60 cm MV E velocity: 57.40 cm/s 103 cm/s MV A velocity: 53.60 cm/s 70.3 cm/s MV E/A ratio:  1.07       1.5  Oswaldo Milian MD Electronically signed by Oswaldo Milian MD Signature Date/Time: 12/02/2019/11:35:29 AMThe mitral valve is normal in structure.    Final      Phillips Climes M.D on 12/13/2019 at 12:58 PM  Between 7am to 7pm - Pager - [phone]  After 7pm go to [email or web address] - password [email or web address] [facility]  [facility] -  Office  [phone]

## 2019-12-14 ENCOUNTER — Inpatient Hospital Stay (HOSPITAL_COMMUNITY): Payer: Medicare HMO

## 2019-12-14 ENCOUNTER — Telehealth: Payer: Self-pay | Admitting: *Deleted

## 2019-12-14 DIAGNOSIS — E1169 Type 2 diabetes mellitus with other specified complication: Secondary | ICD-10-CM

## 2019-12-14 DIAGNOSIS — E785 Hyperlipidemia, unspecified: Secondary | ICD-10-CM

## 2019-12-14 DIAGNOSIS — M7989 Other specified soft tissue disorders: Secondary | ICD-10-CM

## 2019-12-14 DIAGNOSIS — I63312 Cerebral infarction due to thrombosis of left middle cerebral artery: Secondary | ICD-10-CM

## 2019-12-14 DIAGNOSIS — I6521 Occlusion and stenosis of right carotid artery: Secondary | ICD-10-CM

## 2019-12-14 DIAGNOSIS — I10 Essential (primary) hypertension: Secondary | ICD-10-CM

## 2019-12-14 DIAGNOSIS — Z794 Long term (current) use of insulin: Secondary | ICD-10-CM

## 2019-12-14 DIAGNOSIS — R4182 Altered mental status, unspecified: Secondary | ICD-10-CM

## 2019-12-14 DIAGNOSIS — E1159 Type 2 diabetes mellitus with other circulatory complications: Secondary | ICD-10-CM

## 2019-12-14 DIAGNOSIS — E162 Hypoglycemia, unspecified: Secondary | ICD-10-CM

## 2019-12-14 LAB — BASIC METABOLIC PANEL
Anion gap: 9 (ref 5–15)
BUN: 7 mg/dL — ABNORMAL LOW (ref 8–23)
CO2: 21 mmol/L — ABNORMAL LOW (ref 22–32)
Calcium: 8.4 mg/dL — ABNORMAL LOW (ref 8.9–10.3)
Chloride: 114 mmol/L — ABNORMAL HIGH (ref 98–111)
Creatinine, Ser: 0.8 mg/dL (ref 0.44–1.00)
GFR calc Af Amer: >60 mL/min (ref >60)
GFR calc non Af Amer: >60 mL/min (ref >60)
Glucose, Bld: 125 mg/dL — ABNORMAL HIGH (ref 70–99)
Potassium: 4.5 mmol/L (ref 3.5–5.1)
Sodium: 144 mmol/L (ref 135–145)

## 2019-12-14 LAB — CBC
HCT: 33.2 % — ABNORMAL LOW (ref 36.0–46.0)
Hemoglobin: 10.5 g/dL — ABNORMAL LOW (ref 12.0–15.0)
MCH: 27.5 pg (ref 26.0–34.0)
MCHC: 31.6 g/dL (ref 30.0–36.0)
MCV: 86.9 fL (ref 80.0–100.0)
Platelets: 305 10*3/uL (ref 150–400)
RBC: 3.82 MIL/uL — ABNORMAL LOW (ref 3.87–5.11)
RDW: 13.3 % (ref 11.5–15.5)
WBC: 4.5 10*3/uL (ref 4.0–10.5)
nRBC: 0 % (ref 0.0–0.2)

## 2019-12-14 LAB — GLUCOSE, CAPILLARY
Glucose-Capillary: 78 mg/dL (ref 70–99)
Glucose-Capillary: 111 mg/dL — ABNORMAL HIGH (ref 70–99)
Glucose-Capillary: 248 mg/dL — ABNORMAL HIGH (ref 70–99)

## 2019-12-14 LAB — LIPID PANEL
Cholesterol: 117 mg/dL (ref 0–200)
HDL: 24 mg/dL — ABNORMAL LOW (ref >40)
LDL Cholesterol: 77 mg/dL (ref 0–99)
Total CHOL/HDL Ratio: 4.9 RATIO
Triglycerides: 79 mg/dL (ref <150)
VLDL: 16 mg/dL (ref 0–40)

## 2019-12-14 LAB — C-REACTIVE PROTEIN: CRP: 10.6 mg/dL — ABNORMAL HIGH (ref <1.0)

## 2019-12-14 LAB — TSH: TSH: 0.131 u[IU]/mL — ABNORMAL LOW (ref 0.350–4.500)

## 2019-12-14 LAB — RESPIRATORY PANEL BY RT PCR (FLU A&B, COVID)
Influenza A by PCR: NEGATIVE
Influenza B by PCR: NEGATIVE
SARS Coronavirus 2 by RT PCR: POSITIVE — AB

## 2019-12-14 LAB — T4, FREE: Free T4: 1.41 ng/dL — ABNORMAL HIGH (ref 0.61–1.12)

## 2019-12-14 LAB — HEMOGLOBIN A1C
Hgb A1c MFr Bld: 5.5 % (ref 4.8–5.6)
Mean Plasma Glucose: 111.15 mg/dL

## 2019-12-14 LAB — D-DIMER, QUANTITATIVE: D-Dimer, Quant: 1.12 ug/mL-FEU — ABNORMAL HIGH (ref 0.00–0.50)

## 2019-12-14 LAB — SARS CORONAVIRUS 2 (TAT 6-24 HRS): SARS Coronavirus 2: POSITIVE — AB

## 2019-12-14 MED ORDER — LEVOTHYROXINE SODIUM 100 MCG PO TABS
100.0000 ug | ORAL_TABLET | Freq: Every day | ORAL | Status: DC
  Administered 2019-12-15: 06:00:00 100 ug via ORAL
  Filled 2019-12-14: qty 1

## 2019-12-14 MED ORDER — IOHEXOL 350 MG/ML SOLN
75.0000 mL | Freq: Once | INTRAVENOUS | Status: AC | PRN
  Administered 2019-12-14: 75 mL via INTRAVENOUS

## 2019-12-14 MED ORDER — ATORVASTATIN CALCIUM 40 MG PO TABS
40.0000 mg | ORAL_TABLET | Freq: Every day | ORAL | Status: DC
  Administered 2019-12-14: 40 mg via ORAL
  Filled 2019-12-14: qty 1

## 2019-12-14 MED ORDER — ATORVASTATIN CALCIUM 10 MG PO TABS: 20.0000 mg | ORAL_TABLET | Freq: Every day | ORAL | Status: DC

## 2019-12-14 NOTE — Procedures (Addendum)
Patient Name: REHEMA MUFFLEY  MRN: [id number]  Epilepsy Attending: Lora Havens  Referring Physician/Provider: Dr Rosalin Hawking Date: 12/13/2019 Duration: 21.47 mins  Patient history: 73yo F, COVID+ presenting with increasing fatigue found to have glucose of 42 and R sided weakness. Patient continues to be encephalopathic. EEG to evaluate for seizure.  Level of alertness: awake  AEDs during EEG study: Gabapentin  Technical aspects: This EEG study was done with scalp electrodes positioned according to the 10-20 International system of electrode placement. Electrical activity was acquired at a sampling rate of 500Hz  and reviewed with a high frequency filter of 70Hz  and a low frequency filter of 1Hz . EEG data were recorded continuously and digitally stored.   DESCRIPTION:The posterior dominant rhythm consists of 8 Hz activity of moderate voltage (25-35 uV) seen predominantly in posterior head regions, symmetric and reactive to eye opening and eye closing. EEG also showed continuous generalized 3-5Hz  theta-delta slowing. Triphasic waves, generalized, maximal bifrontal were also noted. Hyperventilation and photic stimulation were not performed.  ABNORMALITY - Triphasic waves, generalized, maximal bifrontal - Continuous slow, generalized  IMPRESSION: This study is suggestive of mild diffuse encephalopathy, non specific to etiology but could be secondary to toxic-metabolic causes. No seizures or epileptiform discharges were seen throughout the recording.  Grizelda Piscopo Barbra Sarks

## 2019-12-14 NOTE — Progress Notes (Signed)
PROGRESS NOTE    [name]  WIO:[id number] DOB: 1946/08/20 DOA: 12/12/2019 PCP: Everardo Beals, NP  Brief Narrative:   74 year old lady with prior history of type 2 diabetes mellitus on insulin, hypertension, COPD, Crohn's disease presents to ED for altered mental status.  On arrival to ED she was found to have right-sided weakness.  CT of the head and MRI was significant for acute CVA and admitted for further evaluation of stroke.  Assessment & Plan:   Active Problems:   Hypothyroidism   Type 2 diabetes mellitus ([place])   Hyperlipidemia associated with type 2 diabetes mellitus ([place])   EMPHYSEMA   Essential hypertension   Altered mental status   Hypoglycemia    Left basal ganglia stroke with residual right-sided hemiparesis. MRI of the brain shows acute left basal ganglia infarct MRA is unremarkable Carotid duplex on the right side showed 40 to 50% and on the left shows 40 to 59% stenosis. CTA of the neck shows right ICA bulb 80% stenosis. 2D echocardiogram shows left ventricular ejection fraction of 60 to 65% without any thrombus. LDL is 77, hemoglobin A1c is 5.5 Patient was started on aspirin and Plavix recommend to continue for 3 weeks followed by aspirin alone.  Patient underwent therapy evaluations recommending home health PT and speech follow-up. She also has lower extremity and upper extremity duplex ordered and pending.    COVID-19 viral infection Asymptomatic and was found to be positive on 11/29/2019.     Acute encephalopathy probably related to hypoglycemia, stroke. Resolved patient is alert and answering questions.    Right carotid stenosis Recommend vascular surgery follow-up as an outpatient.    Hypertension Permissive hypertension Keep blood pressure less than 220/1 20 mmHg.    Hyperlipidemia Goal LDL less than 70, Lipitor 40 mg daily was added.    Type 2 diabetes mellitus uncontrolled with hypoglycemia Liberalize her  diet.   Hypothyroidism TSH low with elevated free T4 Decrease the dose of Synthroid from 125 to 100 MCG daily.   Recently diagnosed pyelonephritis Complete the course of Augmentin.       DVT prophylaxis: Lovenox Code Status: full code.  Family Communication: none at bedside. Discussed with husband over the phone.  Disposition Plan:  . Patient came from: Home           . Anticipated d/c place: Discharge home with home health tomorrow . Barriers to d/c OR conditions which need to be met to effect a safe d/c: Pending vascular studies   Consultants:   Neurology  Procedures: MRI of the brain, CT angiogram of the head and neck, echocardiogram  Antimicrobials: Augmentin.     Subjective: No chest pain or sob. ,wants to go home.   Objective: Vitals:   12/13/19 2347 12/14/19 0400 12/14/19 0849 12/14/19 1124  BP: (!) 138/49  (!) 134/57 (!) 146/50  Pulse: 67  63 (!) 56  Resp:   15 18  Temp: 98.6 F (37 C) 98.7 F (37.1 C) 98.1 F (36.7 C) 98 F (36.7 C)  TempSrc: Oral Oral Oral Oral  SpO2:   95% 98%    Intake/Output Summary (Last 24 hours) at 12/14/2019 1239 Last data filed at 12/14/2019 3845 Gross per 24 hour  Intake 1051.39 ml  Output 500 ml  Net 551.39 ml   There were no vitals filed for this visit.  Examination:  General exam: Appears calm and comfortable  Respiratory system: Clear to auscultation. Respiratory effort normal. Cardiovascular system: S1 & S2 heard, RRR. No JVD,  Gastrointestinal system: Abdomen is nondistended, soft and nontender.  Normal bowel sounds heard. Central nervous system: Alert and oriented to place and person, mild right hemiparesis.  Extremities: no cyanosis or clubbing.  Skin: No rashes, lesions or ulcers Psychiatry:  Mood & affect appropriate.     Data Reviewed: I have personally reviewed following labs and imaging studies  CBC: Recent Labs  Lab 12/12/19 1036 12/14/19 0421  WBC 7.8 4.5  NEUTROABS 6.3  --   HGB  11.7* 10.5*  HCT 35.8* 33.2*  MCV 85.6 86.9  PLT 292 858   Basic Metabolic Panel: Recent Labs  Lab 12/12/19 1036 12/12/19 2234 12/13/19 0246 12/13/19 0640 12/13/19 0655 12/14/19 0421  NA 140  --  141 144  --  144  K 3.2*  --  2.5* 2.6*  --  4.5  CL 106  --  107 109  --  114*  CO2 20*  --  21* 22  --  21*  GLUCOSE 42* 134* 37* 119*  --  125*  BUN 11  --  7* 8  --  7*  CREATININE 0.73  --  0.67 0.73  --  0.80  CALCIUM 8.8*  --  8.5* 8.5*  --  8.4*  MG  --   --   --   --  1.4*  --    GFR: CrCl cannot be calculated (Unknown ideal weight.). Liver Function Tests: Recent Labs  Lab 12/12/19 1036  AST 26  ALT 18  ALKPHOS 161*  BILITOT 0.5  PROT 6.7  ALBUMIN 2.6*   No results for input(s): LIPASE, AMYLASE in the last 168 hours. No results for input(s): AMMONIA in the last 168 hours. Coagulation Profile: No results for input(s): INR, PROTIME in the last 168 hours. Cardiac Enzymes: No results for input(s): CKTOTAL, CKMB, CKMBINDEX, TROPONINI in the last 168 hours. BNP (last 3 results) No results for input(s): PROBNP in the last 8760 hours. HbA1C: Recent Labs    12/14/19 0421  HGBA1C 5.5   CBG: Recent Labs  Lab 12/13/19 1335 12/13/19 1735 12/13/19 2350 12/14/19 0603 12/14/19 1125  GLUCAP 119* 93 157* 78 111*   Lipid Profile: Recent Labs    12/14/19 0421  CHOL 117  HDL 24*  LDLCALC 77  TRIG 79  CHOLHDL 4.9   Thyroid Function Tests: Recent Labs    12/14/19 0421  TSH 0.131*   Anemia Panel: No results for input(s): VITAMINB12, FOLATE, FERRITIN, TIBC, IRON, RETICCTPCT in the last 72 hours. Sepsis Labs: Recent Labs  Lab 12/12/19 1158  LATICACIDVEN 1.9    Recent Results (from the past 240 hour(s))  Culture, blood (routine x 2)     Status: None (Preliminary result)   Collection Time: 12/12/19 11:00 AM   Specimen: BLOOD RIGHT WRIST  Result Value Ref Range Status   Specimen Description BLOOD RIGHT WRIST  Final   Special Requests   Final    BOTTLES  DRAWN AEROBIC AND ANAEROBIC Blood Culture results may not be optimal due to an inadequate volume of blood received in culture bottles   Culture   Final    NO GROWTH 2 DAYS Performed at [facility], [place] [address] [place], [place] [address]    Report Status PENDING  Incomplete  Culture, blood (routine x 2)     Status: None (Preliminary result)   Collection Time: 12/12/19 11:31 AM   Specimen: BLOOD LEFT HAND  Result Value Ref Range Status   Specimen Description BLOOD LEFT HAND  Final  Special Requests   Final    BOTTLES DRAWN AEROBIC ONLY Blood Culture results may not be optimal due to an inadequate volume of blood received in culture bottles   Culture   Final    NO GROWTH 2 DAYS Performed at [facility], [place] [address] [place], [place] [address]    Report Status PENDING  Incomplete  Urine culture     Status: None (Preliminary result)   Collection Time: 12/12/19  2:07 PM   Specimen: Urine, Random  Result Value Ref Range Status   Specimen Description URINE, RANDOM  Final   Special Requests NONE  Final   Culture   Final    NO GROWTH Performed at [facility], [place] [address], [place], [place] [address]    Report Status PENDING  Incomplete  SARS CORONAVIRUS 2 (TAT 6-24 HRS) Nasopharyngeal Nasopharyngeal Swab     Status: Abnormal   Collection Time: 12/13/19  5:28 AM   Specimen: Nasopharyngeal Swab  Result Value Ref Range Status   SARS Coronavirus 2 POSITIVE (A) NEGATIVE Final    Comment: RESULT CALLED TO, READ BACK BY AND VERIFIED WITH: RN S BLEDSOE @0358  12/14/19 BY S GEZAHEGN (NOTE) SARS-CoV-2 target nucleic acids are DETECTED. The SARS-CoV-2 RNA is generally detectable in upper and lower respiratory specimens during the acute phase of infection. Positive results are indicative of the presence of SARS-CoV-2 RNA. Clinical correlation with patient history and other diagnostic information is  necessary to determine patient infection status. Positive results  do not rule out bacterial infection or co-infection with other viruses.  The expected result is Negative. Fact Sheet for Patients: SugarRoll.be Fact Sheet for Healthcare Providers: https://www.woods-mathews.com/ This test is not yet approved or cleared by the [place] FDA and  has been authorized for detection and/or diagnosis of SARS-CoV-2 by FDA under an Emergency Use Authorization (EUA). This EUA will remain  in effect (meaning this test can be used) fo r the duration of the COVID-19 declaration under Section 564(b)(1) of the Act, 21 U.S.C. section 360bbb-3(b)(1), unless the authorization is terminated or revoked sooner. Performed at [facility], [place] [address] [place], [place] [address]   Respiratory Panel by RT PCR (Flu A&B, Covid) - Nasopharyngeal Swab     Status: Abnormal   Collection Time: 12/14/19  9:05 AM   Specimen: Nasopharyngeal Swab  Result Value Ref Range Status   SARS Coronavirus 2 by RT PCR POSITIVE (A) NEGATIVE Final    Comment: RESULT CALLED TO, READ BACK BY AND VERIFIED WITH: Alvira Philips RN 10:55 12/14/19 ([email or web address]) (NOTE) SARS-CoV-2 target nucleic acids are DETECTED. SARS-CoV-2 RNA is generally detectable in upper respiratory specimens  during the acute phase of infection. Positive results are indicative of the presence of the identified virus, but do not rule out bacterial infection or co-infection with other pathogens not detected by the test. Clinical correlation with patient history and other diagnostic information is necessary to determine patient infection status. The expected result is Negative. Fact Sheet for Patients:  PinkCheek.be Fact Sheet for Healthcare Providers: GravelBags.it This test is not yet approved or cleared by the [place] FDA and  has been authorized for detection and/or diagnosis of SARS-CoV-2 by FDA under an Emergency Use  Authorization (EUA).  This EUA will remain in effect (meaning this test can be used)  for the duration of  the COVID-19 declaration under Section 564(b)(1) of the Act, 21 U.S.C. section 360bbb-3(b)(1), unless the authorization is terminated or revoked sooner.  Influenza A by PCR NEGATIVE NEGATIVE Final   Influenza B by PCR NEGATIVE NEGATIVE Final    Comment: (NOTE) The Xpert Xpress SARS-CoV-2/FLU/RSV assay is intended as an aid in  the diagnosis of influenza from Nasopharyngeal swab specimens and  should not be used as a sole basis for treatment. Nasal washings and  aspirates are unacceptable for Xpert Xpress SARS-CoV-2/FLU/RSV  testing. Fact Sheet for Patients: PinkCheek.be Fact Sheet for Healthcare Providers: GravelBags.it This test is not yet approved or cleared by the [place] FDA and  has been authorized for detection and/or diagnosis of SARS-CoV-2 by  FDA under an Emergency Use Authorization (EUA). This EUA will remain  in effect (meaning this test can be used) for the duration of the  Covid-19 declaration under Section 564(b)(1) of the Act, 21  U.S.C. section 360bbb-3(b)(1), unless the authorization is  terminated or revoked. Performed at [facility], [place] [address], [place], [place] [address]          Radiology Studies: CT Head Wo Contrast  Result Date: 12/12/2019 CLINICAL DATA:  74 year old female with altered mental status, increased lethargy for 1 week. Patient was recently hospitalized for UTI and COVID-19. EXAM: CT HEAD WITHOUT CONTRAST TECHNIQUE: Contiguous axial images were obtained from the base of the skull through the vertex without intravenous contrast. COMPARISON:  Head CT 05/27/2014. FINDINGS: Brain: Cerebral volume remains within normal limits for age. No midline shift, mass effect, or evidence of intracranial mass lesion. No ventriculomegaly. Confluent 15 millimeter hypodensity in the  left basal ganglia is new since 2015 but age indeterminate. Scattered additional subcortical white matter hypodensity is mildly progressed since 2015. No acute intracranial hemorrhage identified. No cortically based acute infarct identified. Vascular: Calcified atherosclerosis at the skull base. No suspicious intracranial vascular hyperdensity. Skull: No acute osseous abnormality identified. Sinuses/Orbits: Visualized paranasal sinuses and mastoids are stable and well pneumatized. Small chronic left frontoethmoidal recess osteoma appear stable and inconsequential. Other: No acute orbit or scalp soft tissue findings. IMPRESSION: 1. Age indeterminate lacunar infarct in the left basal ganglia is new since 2015. Query recent right side weakness. 2. No other acute intracranial abnormality identified. Electronically Signed   By: Genevie Ann M.D.   On: 12/12/2019 15:30   CT ANGIO NECK W OR WO CONTRAST  Result Date: 12/14/2019 CLINICAL DATA:  Stroke follow-up EXAM: CT ANGIOGRAPHY NECK TECHNIQUE: Multidetector CT imaging of the neck was performed using the standard protocol during bolus administration of intravenous contrast. Multiplanar CT image reconstructions and MIPs were obtained to evaluate the vascular anatomy. Carotid stenosis measurements (when applicable) are obtained utilizing NASCET criteria, using the distal internal carotid diameter as the denominator. CONTRAST:  37m OMNIPAQUE IOHEXOL 350 MG/ML SOLN COMPARISON:  None. FINDINGS: Aortic arch: Diffuse atherosclerotic plaque. Three vessel branching. Right carotid system: Atheromatous wall thickening of the brachiocephalic and common carotid which is low-density. Primarily calcified plaque at the bifurcation causes high-grade bulb stenosis which measures 80% on coronal reformats. No ulceration. Prominent calcified plaque on the carotid siphon; at the posterior genu there is 35% stenosis. Left carotid system: Prominent atheromatous plaque at the origin without flow  limiting stenosis. Prominent low-density plaque on the anterior wall of the common carotid without flow limiting stenosis. Mild plaque at the bifurcation. No ICA beading or ulceration. Vertebral arteries: Proximal subclavian atherosclerosis on both sides. No flow limiting subclavian stenosis. Atheromatous plaque narrows the left vertebral origin with 55% narrowing based on coronal reformats. No subclavian beading or dissection. Skeleton: Ordinary degenerative changes. Other  neck: No significant incidental finding. Upper chest: Subpleural opacities in the apical lungs. Subpleural airspace disease also seen at the bases on recent abdominal CT. IMPRESSION: 1. Diffuse atherosclerosis most notably at the right ICA bulb where there is 80% narrowing. 2. 55% left vertebral origin atheromatous stenosis. The right vertebral artery is slightly dominant. 3. Subpleural pulmonary opacity at the apices correlating with history of COVID-19. Electronically Signed   By: Monte Fantasia M.D.   On: 12/14/2019 08:49   MR ANGIO HEAD WO CONTRAST  Result Date: 12/13/2019 CLINICAL DATA:  Altered mental status EXAM: MRI HEAD WITHOUT CONTRAST MRA HEAD WITHOUT CONTRAST TECHNIQUE: Multiplanar, multiecho pulse sequences of the brain and surrounding structures were obtained without intravenous contrast. Angiographic images of the head were obtained using MRA technique without contrast. COMPARISON:  Head CT from yesterday FINDINGS: MRI HEAD FINDINGS Brain: Wedge of restricted diffusion in the left basal ganglia spanning the upper putamen and caudate body via the corona radiata. Background of mild chronic small vessel ischemia. No hemorrhage, hydrocephalus, collection, or masslike finding Vascular: Normal flow voids.  Arterial findings below. Skull and upper cervical spine: Normal marrow signal. Sinuses/Orbits: Bilateral cataract resection. Other: Progressively motion degraded. MRA HEAD FINDINGS Unremarkable intracranial anatomy with incomplete  circle-of-Willis at the level of the posterior communicating arteries. Mild motion degradation. Tortuous carotid siphons with mild atherosclerotic type irregularity versus flow artifact. No branch occlusion or flow limiting stenosis. Negative for aneurysm. IMPRESSION: 1. Acute perforator infarct at the left basal ganglia. 2. Background mild chronic small vessel ischemia in the cerebral white matter. 3. Negative intracranial MRA. Electronically Signed   By: Monte Fantasia M.D.   On: 12/13/2019 05:03   MR BRAIN WO CONTRAST  Result Date: 12/13/2019 CLINICAL DATA:  Altered mental status EXAM: MRI HEAD WITHOUT CONTRAST MRA HEAD WITHOUT CONTRAST TECHNIQUE: Multiplanar, multiecho pulse sequences of the brain and surrounding structures were obtained without intravenous contrast. Angiographic images of the head were obtained using MRA technique without contrast. COMPARISON:  Head CT from yesterday FINDINGS: MRI HEAD FINDINGS Brain: Wedge of restricted diffusion in the left basal ganglia spanning the upper putamen and caudate body via the corona radiata. Background of mild chronic small vessel ischemia. No hemorrhage, hydrocephalus, collection, or masslike finding Vascular: Normal flow voids.  Arterial findings below. Skull and upper cervical spine: Normal marrow signal. Sinuses/Orbits: Bilateral cataract resection. Other: Progressively motion degraded. MRA HEAD FINDINGS Unremarkable intracranial anatomy with incomplete circle-of-Willis at the level of the posterior communicating arteries. Mild motion degradation. Tortuous carotid siphons with mild atherosclerotic type irregularity versus flow artifact. No branch occlusion or flow limiting stenosis. Negative for aneurysm. IMPRESSION: 1. Acute perforator infarct at the left basal ganglia. 2. Background mild chronic small vessel ischemia in the cerebral white matter. 3. Negative intracranial MRA. Electronically Signed   By: Monte Fantasia M.D.   On: 12/13/2019 05:03    CT Abdomen Pelvis W Contrast  Result Date: 12/12/2019 CLINICAL DATA:  Altered mental status EXAM: CT ABDOMEN AND PELVIS WITH CONTRAST TECHNIQUE: Multidetector CT imaging of the abdomen and pelvis was performed using the standard protocol following bolus administration of intravenous contrast. CONTRAST:  152m OMNIPAQUE IOHEXOL 300 MG/ML  SOLN COMPARISON:  11/29/2019 noncontrast study FINDINGS: Lower chest: Partially imaged bibasilar peripheral opacities, which may reflect atelectasis or recent COVID infection. Hepatobiliary: No focal liver abnormality is seen. No gallstones, gallbladder wall thickening, or biliary dilatation. Pancreas: Unremarkable. Spleen: Unremarkable. Adrenals/Urinary Tract: Bladder is mildly distended and partially obscured by artifact from hip arthroplasty. The distal  ureters are also not well seen. Otherwise, fullness of the ureters and renal collecting systems, which may related to bladder distension. Adrenals are normal. Stomach/Bowel: Bowel is normal in caliber. Colon is primarily fluid-filled. Appendix is not visualized. Vascular/Lymphatic: Marked aortoiliac and branch atherosclerosis. No adenopathy. Reproductive: Endometrial canal remains distended with fluid. No adnexal mass. Other: No new abdominal wall finding.  No ascites. Musculoskeletal: Left hip arthroplasty is present with associated streak artifact. Degenerative changes of the spine. No acute osseous abnormality IMPRESSION: Mild fullness renal collecting systems and proximal to mid ureters, which may related to bladder distension. Normal caliber but primarily fluid-filled colon. Stable findings of hematometra. Electronically Signed   By: Macy Mis M.D.   On: 12/12/2019 15:39   EEG adult  Result Date: 12/14/2019 Lora Havens, MD     12/14/2019  9:12 AM Patient Name: [name] MRN: [id number] Epilepsy Attending: Lora Havens Referring Physician/Provider: Dr Rosalin Hawking Date: 12/13/2019 Duration: 21.47  mins Patient history: 73yo F, COVID+ presenting with increasing fatigue found to have glucose of 42 and R sided weakness. Patient continues to be encephalopathic. EEG to evaluate for seizure. Level of alertness: awake AEDs during EEG study: Gabapentin Technical aspects: This EEG study was done with scalp electrodes positioned according to the 10-20 International system of electrode placement. Electrical activity was acquired at a sampling rate of 500Hz  and reviewed with a high frequency filter of 70Hz  and a low frequency filter of 1Hz . EEG data were recorded continuously and digitally stored. DESCRIPTION:The posterior dominant rhythm consists of 8 Hz activity of moderate voltage (25-35 uV) seen predominantly in posterior head regions, symmetric and reactive to eye opening and eye closing. EEG also showed continuous generalized 3-5Hz  theta-delta slowing. Triphasic waves, generalized, maximal bifrontal were also noted. Hyperventilation and photic stimulation were not performed. ABNORMALITY - Triphasic waves, generalized, maximal bifrontal - Continuous slow, generalized  IMPRESSION: This study is suggestive of mild diffuse encephalopathy, non specific to etiology but could be secondary to toxic-metabolic causes. No seizures or epileptiform discharges were seen throughout the recording.  Priyanka O Yadav   VAS US CAROTID (at [facility] and [facility] only)  Result Date: 12/14/2019 Carotid Arterial Duplex Study Indications:       CVA. Risk Factors:      Hypertension, hyperlipidemia, Diabetes. Other Factors:     COPD. Comparison Study:  No prior study. Performing Technologist: Baldwin Crown ARDMS, RVT  Examination Guidelines: A complete evaluation includes B-mode imaging, spectral Doppler, color Doppler, and power Doppler as needed of all accessible portions of each vessel. Bilateral testing is considered an integral part of a complete examination. Limited examinations for reoccurring indications may be performed as noted.   Right Carotid Findings: +----------+--------+--------+--------+------------------+--------+           PSV cm/sEDV cm/sStenosisPlaque DescriptionComments +----------+--------+--------+--------+------------------+--------+ CCA Prox  91      14                                         +----------+--------+--------+--------+------------------+--------+ CCA Distal81      13                                         +----------+--------+--------+--------+------------------+--------+ ICA Prox  204     38      40-59%  heterogenous               +----------+--------+--------+--------+------------------+--------+  ICA Mid   162     25                                         +----------+--------+--------+--------+------------------+--------+ ICA Distal199     27                                         +----------+--------+--------+--------+------------------+--------+ ECA       276     18              heterogenous               +----------+--------+--------+--------+------------------+--------+ +----------+--------+-------+----------------+-------------------+           PSV cm/sEDV cmsDescribe        Arm Pressure (mmHG) +----------+--------+-------+----------------+-------------------+ [id number]            Multiphasic, WNL                    +----------+--------+-------+----------------+-------------------+ +---------+--------+--+--------+--+---------+ VertebralPSV cm/s74EDV cm/s14Antegrade +---------+--------+--+--------+--+---------+  Left Carotid Findings: +----------+--------+--------+--------+------------------+--------+           PSV cm/sEDV cm/sStenosisPlaque DescriptionComments +----------+--------+--------+--------+------------------+--------+ CCA Prox  119     17                                         +----------+--------+--------+--------+------------------+--------+ CCA Distal164     20                                          +----------+--------+--------+--------+------------------+--------+ ICA Prox  180     29      40-59%  heterogenous               +----------+--------+--------+--------+------------------+--------+ ICA Mid   149     21                                         +----------+--------+--------+--------+------------------+--------+ ICA Distal129     23                                         +----------+--------+--------+--------+------------------+--------+ ECA       187     21              heterogenous               +----------+--------+--------+--------+------------------+--------+ +----------+--------+--------+--------+-------------------+           PSV cm/sEDV cm/sDescribeArm Pressure (mmHG) +----------+--------+--------+--------+-------------------+ [id number]             Elevated                    +----------+--------+--------+--------+-------------------+ +---------+--------+--+--------+--+---------+ VertebralPSV cm/s53EDV cm/s10Antegrade +---------+--------+--+--------+--+---------+   Summary: Right Carotid: Velocities in the right ICA are consistent with a 40-59%                stenosis. Left Carotid: Velocities in the left ICA are consistent with a 40-59% stenosis. Vertebrals:  Bilateral  vertebral arteries demonstrate antegrade flow. Subclavians: Left subclavian artery flow was elevated, but did not appear              stenotic. Normal flow hemodynamics were seen in the right              subclavian artery. *See table(s) above for measurements and observations.  Electronically signed by Antony Contras MD on 12/14/2019 at 8:24:28 AM.    Final    ECHOCARDIOGRAM LIMITED  Result Date: 12/13/2019   ECHOCARDIOGRAM LIMITED REPORT   Patient Name:   NAYLEA WIGINGTON Date of Exam: 12/13/2019 Medical Rec #:  [id number]        Height:       61.0 in Accession #:    [id number]       Weight:       165.0 lb Date of Birth:  07-Dec-1945        BSA:          1.74 m Patient Age:    81  years         BP:           138/61 mmHg Patient Gender: F                HR:           73 bpm. Exam Location:  Inpatient  Procedure: Limited Echo Indications:    Stroke 434.91 / I163.9  History:        Patient has prior history of Echocardiogram examinations, most                 recent 12/02/2019. Sepsis.  Sonographer:    Vikki Ports Turrentine Referring Phys: [id number] [place] R AROOR IMPRESSIONS  1. Left ventricular ejection fraction, by visual estimation, is 60 to 65%. The left ventricle has normal function. There is no increased left ventricular wall thickness.  2. The left ventricle has no regional wall motion abnormalities.  3. Global right ventricle has normal systolc function.The right ventricular size is normal. no increase in right ventricular wall thickness.  4. The mitral valve is normal in structure. No evidence of mitral valve regurgitation. No evidence of mitral stenosis.  5. The tricuspid valve was normal in structure. Tricuspid valve regurgitation is trivial.  6. Tricuspid valve regurgitation is trivial.  7. No evidence of aortic valve sclerosis or stenosis.  8. The pulmonic valve was normal in structure.  9. Normal pulmonary artery systolic pressure. 10. The inferior vena cava is normal in size with greater than 50% respiratory variability, suggesting right atrial pressure of 3 mmHg. 11. No significant change from prior study. 12. Prior images reviewed side by side. FINDINGS  Left Ventricle: Left ventricular ejection fraction, by visual estimation, is 60 to 65%. The left ventricle has normal function. The left ventricle has no regional wall motion abnormalities. The left ventricular internal cavity size was the left ventricle is normal in size. There is no increased left ventricular wall thickness. Left ventricular diastolic parameters were normal. Normal left atrial pressure. Right Ventricle: The right ventricular size is normal. No increase in right ventricular wall thickness. Global RV systolic  function is has normal systolic function. The tricuspid regurgitant velocity is 2.08 m/s, and with an assumed right atrial pressure  of 3 mmHg, the estimated right ventricular systolic pressure is normal at 20.3 mmHg. Left Atrium: Left atrial size was normal in size. Right Atrium: Right atrial size was normal in size. Right atrial pressure is estimated at 3 mmHg. Pericardium:  There is no evidence of pericardial effusion is seen. There is no evidence of pericardial effusion. Mitral Valve: The mitral valve is normal in structure. No evidence of mitral valve stenosis by observation. MV Area by PHT, 2.95 cm. MV PHT, 74.53 msec. No evidence of mitral valve regurgitation. Tricuspid Valve: The tricuspid valve is normal in structure. Tricuspid valve regurgitation is trivial. Aortic Valve: The aortic valve is normal in structure. Aortic valve regurgitation is not visualized. The aortic valve is structurally normal, with no evidence of sclerosis or stenosis. Pulmonic Valve: The pulmonic valve was normal in structure. Pulmonic valve regurgitation is not visualized by color flow Doppler. Pulmonic regurgitation is not visualized by color flow Doppler. Aorta: The aortic root, ascending aorta and aortic arch are all structurally normal, with no evidence of dilitation or obstruction. Venous: The inferior vena cava is normal in size with greater than 50% respiratory variability, suggesting right atrial pressure of 3 mmHg. Shunts: There is no evidence of a patent foramen ovale. No ventricular septal defect is seen or detected. There is no evidence of an atrial septal defect. No atrial level shunt detected by color flow Doppler.  LEFT VENTRICLE          Normals PLAX 2D LVOT diam:     1.70 cm  2.0 cm LVOT Area:     2.27 cm 3.14 cm2  AORTIC VALVE             Normals LVOT Vmax:   124.00 cm/s LVOT Vmean:  75.800 cm/s 75 cm/s LVOT VTI:    0.274 m     25.3 cm MITRAL VALVE              Normals   TRICUSPID VALVE             Normals MV Area  (PHT): 2.95 cm             TR Peak grad:   17.3 mmHg MV PHT:        74.53 msec 55 ms     TR Vmax:        208.00 cm/s 288 cm/s MV Decel Time: 257 msec   187 ms MV E velocity: 93.80 cm/s 103 cm/s  SHUNTS MV A velocity: 94.70 cm/s 70.3 cm/s Systemic VTI:  0.27 m MV E/A ratio:  0.99       1.5       Systemic Diam: 1.70 cm  Dani Gobble Croitoru MD Electronically signed by Sanda Klein MD Signature Date/Time: 12/13/2019/1:46:06 PMThe mitral valve is normal in structure.    Final         Scheduled Meds: . amoxicillin-clavulanate  1 tablet Oral BID  . aspirin EC  81 mg Oral Daily  . clopidogrel  75 mg Oral Daily  . enoxaparin (LOVENOX) injection  40 mg Subcutaneous Q24H  . gabapentin  900 mg Oral BID  . gemfibrozil  600 mg Oral BID  . levothyroxine  125 mcg Oral QAC breakfast  . methocarbamol  500 mg Oral BID  . oxybutynin  10 mg Oral QHS  . oxyCODONE  10 mg Oral Q12H   Continuous Infusions: . dextrose 5 % and 0.9% NaCl Stopped (12/14/19 0501)     LOS: 2 days    Time spent:31 min    Hosie Poisson, MD [facility]   To contact the attending provider between 7A-7P or the covering provider during after hours 7P-7A, please log into the web site [email or web address] and access using universal [place] password for that  web site. If you do not have the password, please call the hospital operator.  12/14/2019, 12:39 PM

## 2019-12-14 NOTE — Progress Notes (Signed)
Right upper extremity venous duplex and bilateral lower extremity venous duplex completed. Refer to "CV Proc" under chart review to view preliminary results.  12/14/2019 3:05 PM Kelby Aline., MHA, RVT, RDCS, RDMS

## 2019-12-14 NOTE — Evaluation (Signed)
Occupational Therapy Evaluation Patient Details Name: [name] MRN: [id number] DOB: [birth year] Today's Date: 12/14/2019    History of Present Illness Pt is a 75 y.o. female with past with history significant for insulin-dependent diabetes mellitus, hypertension, COPD with recent admission for pyelonephritis on 1/12, incidentally found to be COVID-19 positive presents to the emergency department with altered mental status.  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   Clinical Impression   PTA pt living with spouse, needing increased assist from dtr as of late due to increasing weakness. Was recently hospitalized for a UTI. Prior to that was independent with BADL/IADL. At time of eval, pt completed bed mobility with mod A +2 and functional transfers with mod-min A +2 with RW. Pt completed transfer to [facility] X2 with min A +2, and min A for peri hygiene. Pt able to complete functional mobility a household distance with min A and RW. Pt fatigues easily, remains unsteady, and is at increased fall risk. She also presents with cognitive.language deficits. She has difficulty expressing information but is accurate when given choices. Spoke with dtr and pt about CIR as a potential option, but both are adamant about going home. Will continue to follow per POC listed below.     Follow Up Recommendations  Home health OT;Supervision/Assistance - 24 hour;Other (comment)(spoke with dtr and pt about CIR, but they deny)    Equipment Recommendations  3 in 1 bedside commode    Recommendations for Other Services       Precautions / Restrictions Precautions Precautions: Fall Restrictions Weight Bearing Restrictions: No      Mobility Bed Mobility Overal bed mobility: Needs Assistance Bed Mobility: Supine to Sit     Supine to sit: Mod assist;+2 for safety/equipment     General bed mobility comments: mod A to scoot forward; assistance for trunk  elevation  Transfers Overall transfer level: Needs assistance Equipment used: Rolling walker (2 wheeled) Transfers: Sit to/from [facility] Sit to Stand: Mod assist;+2 safety/equipment;Min assist Stand pivot transfers: Min assist;+2 safety/equipment       General transfer comment: sit to stand x 3; cued for safe hand placement; required increased time and min A to mod assistance to boost; min A for RW management with turns; assist for toileting ADLs    Balance Overall balance assessment: Needs assistance Sitting-balance support: Bilateral upper extremity supported;Feet supported Sitting balance-Leahy Scale: Good     Standing balance support: Bilateral upper extremity supported;During functional activity Standing balance-Leahy Scale: Poor Standing balance comment: pt initially with posterior lean; cued for upright posture verbally and tactile; improved only requiring min guard                           ADL either performed or assessed with clinical judgement   ADL Overall ADL's : Needs assistance/impaired Eating/Feeding: Set up;Sitting   Grooming: Set up;Sitting   Upper Body Bathing: Minimal assistance;Sitting   Lower Body Bathing: Minimal assistance;Sit to/from stand;Sitting/lateral leans   Upper Body Dressing : Minimal assistance;Sitting   Lower Body Dressing: Minimal assistance;Sit to/from stand;Sitting/lateral leans   Toilet Transfer: Minimal assistance;+2 for physical assistance;+2 for safety/equipment;Stand-pivot;BSC;RW Toilet Transfer Details (indicate cue type and reason): to [facility] for urgency Toileting- Clothing Manipulation and Hygiene: Moderate assistance;Sit to/from stand Toileting - Clothing Manipulation Details (indicate cue type and reason): to complete peri hygiene     Functional mobility during ADLs: Minimal assistance;+2 for physical assistance;+2 for safety/equipment;Rolling walker General  ADL Comments: pt limited by cognitive  deficits, decreased activity tolerance, and generalized weakness     Vision Baseline Vision/History: Wears glasses Wears Glasses: At all times Patient Visual Report: No change from baseline Additional Comments: able to scan and track without difficulty, attending to both sides     Perception     Praxis      Pertinent Vitals/Pain Pain Assessment: No/denies pain     Hand Dominance     Extremity/Trunk Assessment Upper Extremity Assessment Upper Extremity Assessment: RUE deficits/detail RUE Deficits / Details: ROM grossly WFL; Strength 4/5; R hand with edema (RN aware) LUE Deficits / Details: ROM grossly WFL; Strength 4+/5           Communication Communication Communication: No difficulties   Cognition Arousal/Alertness: Awake/alert Behavior During Therapy: WFL for tasks assessed/performed Overall Cognitive Status: Impaired/Different from baseline Area of Impairment: Orientation;Problem solving                 Orientation Level: Disoriented to;Time;Situation;Place           Problem Solving: Slow processing;Requires verbal cues;Requires tactile cues General Comments: When given choices she can come to the correct answer.  Daughter very tangential during session.  Pt and daughter adamant to return home.   General Comments       Exercises     Shoulder Instructions      Home Living Family/patient expects to be discharged to:: Private residence Living Arrangements: Children;Spouse/significant other Available Help at Discharge: Family;Available 24 hours/day Type of Home: House Home Access: Stairs to enter [employer] of Steps: 2 Entrance Stairs-Rails: Right;Left Home Layout: One level     Bathroom Shower/Tub: Occupational psychologist: Handicapped height     Home Equipment: Bedside commode;Shower seat;Walker - 2 wheels;Cane - single point;Grab bars - tub/shower;Other (comment);Wheelchair - manual          Prior  Functioning/Environment Level of Independence: Independent with assistive device(s)  Gait / Transfers Assistance Needed: Prior to UTI 2 weeks ago completely independent with  use of cane; could ambulate in community.  For past few weeks using a RW and needing assistance with ADls. ADL's / Homemaking Assistance Needed: Normally independent            OT Problem List: Decreased strength;Decreased activity tolerance;Impaired balance (sitting and/or standing);Decreased knowledge of use of DME or AE;Decreased knowledge of precautions;Decreased cognition      OT Treatment/Interventions: Self-care/ADL training;Energy conservation;DME and/or AE instruction;Patient/family education;Balance training;Therapeutic activities;Therapeutic exercise;Neuromuscular education;Cognitive remediation/compensation    OT Goals(Current goals can be found in the care plan section) Acute Rehab OT Goals Patient Stated Goal: return home OT Goal Formulation: With patient Time For Goal Achievement: 12/28/19 Potential to Achieve Goals: Good  OT Frequency: Min 2X/week   Barriers to D/C:            Co-evaluation              AM-PAC OT "6 Clicks" Daily Activity     Outcome Measure Help from another person eating meals?: A Little Help from another person taking care of personal grooming?: A Little Help from another person toileting, which includes using toliet, bedpan, or urinal?: A Lot Help from another person bathing (including washing, rinsing, drying)?: A Lot Help from another person to put on and taking off regular upper body clothing?: A Little Help from another person to put on and taking off regular lower body clothing?: A Lot 6 Click Score: 15   End of Session Equipment Utilized  During Treatment: Gait belt;Rolling walker Nurse Communication: Mobility status  Activity Tolerance: Patient tolerated treatment well Patient left: in chair;with call bell/phone within reach;with chair alarm set;with  family/visitor present  OT Visit Diagnosis: Unsteadiness on feet (R26.81);Muscle weakness (generalized) (M62.81);Other symptoms and signs involving cognitive function;Cognitive communication deficit (R41.841) Symptoms and signs involving cognitive functions: Cerebral infarction                Time: 1727-1800 OT Time Calculation (min): 33 min Charges:  OT General Charges $OT Visit: 1 Visit OT Evaluation $OT Eval Moderate Complexity: 1 Mod  Zenovia Jarred, MSOT, OTR/L Acute Rehabilitation Services [facility] Office Number: [phone]  Zenovia Jarred 12/14/2019, 6:39 PM

## 2019-12-14 NOTE — Progress Notes (Signed)
STROKE TEAM PROGRESS NOTE   INTERVAL HISTORY No family at bedside. Pt awake alert and interactive today, much improved from yesterday, still has mild right hemiparesis. Stroke work up completed.    Vitals:   12/13/19 2347 12/14/19 0400 12/14/19 0849 12/14/19 1124  BP: (!) 138/49  (!) 134/57 (!) 146/50  Pulse: 67  63 (!) 56  Resp:   15 18  Temp: 98.6 F (37 C) 98.7 F (37.1 C) 98.1 F (36.7 C) 98 F (36.7 C)  TempSrc: Oral Oral Oral Oral  SpO2:   95% 98%    CBC:  Recent Labs  Lab 12/12/19 1036 12/14/19 0421  WBC 7.8 4.5  NEUTROABS 6.3  --   HGB 11.7* 10.5*  HCT 35.8* 33.2*  MCV 85.6 86.9  PLT 292 361    Basic Metabolic Panel:  Recent Labs  Lab 12/13/19 0640 12/13/19 0655 12/14/19 0421  NA 144  --  144  K 2.6*  --  4.5  CL 109  --  114*  CO2 22  --  21*  GLUCOSE 119*  --  125*  BUN 8  --  7*  CREATININE 0.73  --  0.80  CALCIUM 8.5*  --  8.4*  MG  --  1.4*  --    Lipid Panel:     Component Value Date/Time   CHOL 117 12/14/2019 0421   TRIG 79 12/14/2019 0421   HDL 24 (L) 12/14/2019 0421   CHOLHDL 4.9 12/14/2019 0421   VLDL 16 12/14/2019 0421   LDLCALC 77 12/14/2019 0421   HgbA1c:  Lab Results  Component Value Date   HGBA1C 5.5 12/14/2019   Urine Drug Screen: No results found for: LABOPIA, COCAINSCRNUR, LABBENZ, AMPHETMU, THCU, LABBARB  Alcohol Level No results found for: ETH  IMAGING past 48 hours CT Head Wo Contrast  Result Date: 12/12/2019 CLINICAL DATA:  74 year old female with altered mental status, increased lethargy for 1 week. Patient was recently hospitalized for UTI and COVID-19. EXAM: CT HEAD WITHOUT CONTRAST TECHNIQUE: Contiguous axial images were obtained from the base of the skull through the vertex without intravenous contrast. COMPARISON:  Head CT 05/27/2014. FINDINGS: Brain: Cerebral volume remains within normal limits for age. No midline shift, mass effect, or evidence of intracranial mass lesion. No ventriculomegaly. Confluent 15  millimeter hypodensity in the left basal ganglia is new since 2015 but age indeterminate. Scattered additional subcortical white matter hypodensity is mildly progressed since 2015. No acute intracranial hemorrhage identified. No cortically based acute infarct identified. Vascular: Calcified atherosclerosis at the skull base. No suspicious intracranial vascular hyperdensity. Skull: No acute osseous abnormality identified. Sinuses/Orbits: Visualized paranasal sinuses and mastoids are stable and well pneumatized. Small chronic left frontoethmoidal recess osteoma appear stable and inconsequential. Other: No acute orbit or scalp soft tissue findings. IMPRESSION: 1. Age indeterminate lacunar infarct in the left basal ganglia is new since 2015. Query recent right side weakness. 2. No other acute intracranial abnormality identified. Electronically Signed   By: Genevie Ann M.D.   On: 12/12/2019 15:30   MR ANGIO HEAD WO CONTRAST  Result Date: 12/13/2019 CLINICAL DATA:  Altered mental status EXAM: MRI HEAD WITHOUT CONTRAST MRA HEAD WITHOUT CONTRAST TECHNIQUE: Multiplanar, multiecho pulse sequences of the brain and surrounding structures were obtained without intravenous contrast. Angiographic images of the head were obtained using MRA technique without contrast. COMPARISON:  Head CT from yesterday FINDINGS: MRI HEAD FINDINGS Brain: Wedge of restricted diffusion in the left basal ganglia spanning the upper putamen and caudate body via  the corona radiata. Background of mild chronic small vessel ischemia. No hemorrhage, hydrocephalus, collection, or masslike finding Vascular: Normal flow voids.  Arterial findings below. Skull and upper cervical spine: Normal marrow signal. Sinuses/Orbits: Bilateral cataract resection. Other: Progressively motion degraded. MRA HEAD FINDINGS Unremarkable intracranial anatomy with incomplete circle-of-Willis at the level of the posterior communicating arteries. Mild motion degradation. Tortuous  carotid siphons with mild atherosclerotic type irregularity versus flow artifact. No branch occlusion or flow limiting stenosis. Negative for aneurysm. IMPRESSION: 1. Acute perforator infarct at the left basal ganglia. 2. Background mild chronic small vessel ischemia in the cerebral white matter. 3. Negative intracranial MRA. Electronically Signed   By: Monte Fantasia M.D.   On: 12/13/2019 05:03   MR BRAIN WO CONTRAST  Result Date: 12/13/2019 CLINICAL DATA:  Altered mental status EXAM: MRI HEAD WITHOUT CONTRAST MRA HEAD WITHOUT CONTRAST TECHNIQUE: Multiplanar, multiecho pulse sequences of the brain and surrounding structures were obtained without intravenous contrast. Angiographic images of the head were obtained using MRA technique without contrast. COMPARISON:  Head CT from yesterday FINDINGS: MRI HEAD FINDINGS Brain: Wedge of restricted diffusion in the left basal ganglia spanning the upper putamen and caudate body via the corona radiata. Background of mild chronic small vessel ischemia. No hemorrhage, hydrocephalus, collection, or masslike finding Vascular: Normal flow voids.  Arterial findings below. Skull and upper cervical spine: Normal marrow signal. Sinuses/Orbits: Bilateral cataract resection. Other: Progressively motion degraded. MRA HEAD FINDINGS Unremarkable intracranial anatomy with incomplete circle-of-Willis at the level of the posterior communicating arteries. Mild motion degradation. Tortuous carotid siphons with mild atherosclerotic type irregularity versus flow artifact. No branch occlusion or flow limiting stenosis. Negative for aneurysm. IMPRESSION: 1. Acute perforator infarct at the left basal ganglia. 2. Background mild chronic small vessel ischemia in the cerebral white matter. 3. Negative intracranial MRA. Electronically Signed   By: Monte Fantasia M.D.   On: 12/13/2019 05:03   CT Abdomen Pelvis W Contrast  Result Date: 12/12/2019 CLINICAL DATA:  Altered mental status EXAM: CT  ABDOMEN AND PELVIS WITH CONTRAST TECHNIQUE: Multidetector CT imaging of the abdomen and pelvis was performed using the standard protocol following bolus administration of intravenous contrast. CONTRAST:  121m OMNIPAQUE IOHEXOL 300 MG/ML  SOLN COMPARISON:  11/29/2019 noncontrast study FINDINGS: Lower chest: Partially imaged bibasilar peripheral opacities, which may reflect atelectasis or recent COVID infection. Hepatobiliary: No focal liver abnormality is seen. No gallstones, gallbladder wall thickening, or biliary dilatation. Pancreas: Unremarkable. Spleen: Unremarkable. Adrenals/Urinary Tract: Bladder is mildly distended and partially obscured by artifact from hip arthroplasty. The distal ureters are also not well seen. Otherwise, fullness of the ureters and renal collecting systems, which may related to bladder distension. Adrenals are normal. Stomach/Bowel: Bowel is normal in caliber. Colon is primarily fluid-filled. Appendix is not visualized. Vascular/Lymphatic: Marked aortoiliac and branch atherosclerosis. No adenopathy. Reproductive: Endometrial canal remains distended with fluid. No adnexal mass. Other: No new abdominal wall finding.  No ascites. Musculoskeletal: Left hip arthroplasty is present with associated streak artifact. Degenerative changes of the spine. No acute osseous abnormality IMPRESSION: Mild fullness renal collecting systems and proximal to mid ureters, which may related to bladder distension. Normal caliber but primarily fluid-filled colon. Stable findings of hematometra. Electronically Signed   By: PMacy MisM.D.   On: 12/12/2019 15:39   EEG adult  Result Date: 12/14/2019 YLora Havens MD     12/14/2019  9:12 AM Patient Name: BARIELYS WANDERSEEMRN: [id number] Attending: PLora HavensReferring Physician/Provider: Dr JRosalin HawkingDate:  12/13/2019 Duration: 21.47 mins Patient history: 73yo F, COVID+ presenting with increasing fatigue found to have glucose of 42 and R  sided weakness. Patient continues to be encephalopathic. EEG to evaluate for seizure. Level of alertness: awake AEDs during EEG study: Gabapentin Technical aspects: This EEG study was done with scalp electrodes positioned according to the 10-20 International system of electrode placement. Electrical activity was acquired at a sampling rate of 500Hz  and reviewed with a high frequency filter of 70Hz  and a low frequency filter of 1Hz . EEG data were recorded continuously and digitally stored. DESCRIPTION:The posterior dominant rhythm consists of 8 Hz activity of moderate voltage (25-35 uV) seen predominantly in posterior head regions, symmetric and reactive to eye opening and eye closing. EEG also showed continuous generalized 3-5Hz  theta-delta slowing. Triphasic waves, generalized, maximal bifrontal were also noted. Hyperventilation and photic stimulation were not performed. ABNORMALITY - Triphasic waves, generalized, maximal bifrontal - Continuous slow, generalized  IMPRESSION: This study is suggestive of mild diffuse encephalopathy, non specific to etiology but could be secondary to toxic-metabolic causes. No seizures or epileptiform discharges were seen throughout the recording.  Priyanka O Yadav   VAS US CAROTID (at [facility] and [facility] only)  Result Date: 12/14/2019 Carotid Arterial Duplex Study Indications:       CVA. Risk Factors:      Hypertension, hyperlipidemia, Diabetes. Other Factors:     COPD. Comparison Study:  No prior study. Performing Technologist: Baldwin Crown ARDMS, RVT  Examination Guidelines: A complete evaluation includes B-mode imaging, spectral Doppler, color Doppler, and power Doppler as needed of all accessible portions of each vessel. Bilateral testing is considered an integral part of a complete examination. Limited examinations for reoccurring indications may be performed as noted.  Right Carotid Findings: +----------+--------+--------+--------+------------------+--------+           PSV  cm/sEDV cm/sStenosisPlaque DescriptionComments +----------+--------+--------+--------+------------------+--------+ CCA Prox  91      14                                         +----------+--------+--------+--------+------------------+--------+ CCA Distal81      13                                         +----------+--------+--------+--------+------------------+--------+ ICA Prox  204     38      40-59%  heterogenous               +----------+--------+--------+--------+------------------+--------+ ICA Mid   162     25                                         +----------+--------+--------+--------+------------------+--------+ ICA Distal199     27                                         +----------+--------+--------+--------+------------------+--------+ ECA       276     18              heterogenous               +----------+--------+--------+--------+------------------+--------+ +----------+--------+-------+----------------+-------------------+  PSV cm/sEDV cmsDescribe        Arm Pressure (mmHG) +----------+--------+-------+----------------+-------------------+ [id number]            Multiphasic, WNL                    +----------+--------+-------+----------------+-------------------+ +---------+--------+--+--------+--+---------+ VertebralPSV cm/s74EDV cm/s14Antegrade +---------+--------+--+--------+--+---------+  Left Carotid Findings: +----------+--------+--------+--------+------------------+--------+           PSV cm/sEDV cm/sStenosisPlaque DescriptionComments +----------+--------+--------+--------+------------------+--------+ CCA Prox  119     17                                         +----------+--------+--------+--------+------------------+--------+ CCA Distal164     20                                         +----------+--------+--------+--------+------------------+--------+ ICA Prox  180     29      40-59%   heterogenous               +----------+--------+--------+--------+------------------+--------+ ICA Mid   149     21                                         +----------+--------+--------+--------+------------------+--------+ ICA Distal129     23                                         +----------+--------+--------+--------+------------------+--------+ ECA       187     21              heterogenous               +----------+--------+--------+--------+------------------+--------+ +----------+--------+--------+--------+-------------------+           PSV cm/sEDV cm/sDescribeArm Pressure (mmHG) +----------+--------+--------+--------+-------------------+ [id number]             Elevated                    +----------+--------+--------+--------+-------------------+ +---------+--------+--+--------+--+---------+ VertebralPSV cm/s53EDV cm/s10Antegrade +---------+--------+--+--------+--+---------+   Summary: Right Carotid: Velocities in the right ICA are consistent with a 40-59%                stenosis. Left Carotid: Velocities in the left ICA are consistent with a 40-59% stenosis. Vertebrals:  Bilateral vertebral arteries demonstrate antegrade flow. Subclavians: Left subclavian artery flow was elevated, but did not appear              stenotic. Normal flow hemodynamics were seen in the right              subclavian artery. *See table(s) above for measurements and observations.  Electronically signed by Antony Contras MD on 12/14/2019 at 8:24:28 AM.    Final    ECHOCARDIOGRAM LIMITED  Result Date: 12/13/2019   ECHOCARDIOGRAM LIMITED REPORT   Patient Name:   [name] Date of Exam: 12/13/2019 Medical Rec #:  [id number]        Height:       61.0 in Accession #:    [id number]       Weight:       165.0 lb Date of Birth:  1945/11/27  BSA:          1.74 m Patient Age:    74 years         BP:           138/61 mmHg Patient Gender: F                HR:           73 bpm. Exam  Location:  Inpatient  Procedure: Limited Echo Indications:    Stroke 434.91 / I163.9  History:        Patient has prior history of Echocardiogram examinations, most                 recent 12/02/2019. Sepsis.  Sonographer:    Vikki Ports Turrentine Referring Phys: [id number] Ruby R AROOR IMPRESSIONS  1. Left ventricular ejection fraction, by visual estimation, is 60 to 65%. The left ventricle has normal function. There is no increased left ventricular wall thickness.  2. The left ventricle has no regional wall motion abnormalities.  3. Global right ventricle has normal systolc function.The right ventricular size is normal. no increase in right ventricular wall thickness.  4. The mitral valve is normal in structure. No evidence of mitral valve regurgitation. No evidence of mitral stenosis.  5. The tricuspid valve was normal in structure. Tricuspid valve regurgitation is trivial.  6. Tricuspid valve regurgitation is trivial.  7. No evidence of aortic valve sclerosis or stenosis.  8. The pulmonic valve was normal in structure.  9. Normal pulmonary artery systolic pressure. 10. The inferior vena cava is normal in size with greater than 50% respiratory variability, suggesting right atrial pressure of 3 mmHg. 11. No significant change from prior study. 12. Prior images reviewed side by side. FINDINGS  Left Ventricle: Left ventricular ejection fraction, by visual estimation, is 60 to 65%. The left ventricle has normal function. The left ventricle has no regional wall motion abnormalities. The left ventricular internal cavity size was the left ventricle is normal in size. There is no increased left ventricular wall thickness. Left ventricular diastolic parameters were normal. Normal left atrial pressure. Right Ventricle: The right ventricular size is normal. No increase in right ventricular wall thickness. Global RV systolic function is has normal systolic function. The tricuspid regurgitant velocity is 2.08 m/s, and with an  assumed right atrial pressure  of 3 mmHg, the estimated right ventricular systolic pressure is normal at 20.3 mmHg. Left Atrium: Left atrial size was normal in size. Right Atrium: Right atrial size was normal in size. Right atrial pressure is estimated at 3 mmHg. Pericardium: There is no evidence of pericardial effusion is seen. There is no evidence of pericardial effusion. Mitral Valve: The mitral valve is normal in structure. No evidence of mitral valve stenosis by observation. MV Area by PHT, 2.95 cm. MV PHT, 74.53 msec. No evidence of mitral valve regurgitation. Tricuspid Valve: The tricuspid valve is normal in structure. Tricuspid valve regurgitation is trivial. Aortic Valve: The aortic valve is normal in structure. Aortic valve regurgitation is not visualized. The aortic valve is structurally normal, with no evidence of sclerosis or stenosis. Pulmonic Valve: The pulmonic valve was normal in structure. Pulmonic valve regurgitation is not visualized by color flow Doppler. Pulmonic regurgitation is not visualized by color flow Doppler. Aorta: The aortic root, ascending aorta and aortic arch are all structurally normal, with no evidence of dilitation or obstruction. Venous: The inferior vena cava is normal in size with greater than 50% respiratory variability, suggesting right atrial  pressure of 3 mmHg. Shunts: There is no evidence of a patent foramen ovale. No ventricular septal defect is seen or detected. There is no evidence of an atrial septal defect. No atrial level shunt detected by color flow Doppler.  LEFT VENTRICLE          Normals PLAX 2D LVOT diam:     1.70 cm  2.0 cm LVOT Area:     2.27 cm 3.14 cm2  AORTIC VALVE             Normals LVOT Vmax:   124.00 cm/s LVOT Vmean:  75.800 cm/s 75 cm/s LVOT VTI:    0.274 m     25.3 cm MITRAL VALVE              Normals   TRICUSPID VALVE             Normals MV Area (PHT): 2.95 cm             TR Peak grad:   17.3 mmHg MV PHT:        74.53 msec 55 ms     TR Vmax:         208.00 cm/s 288 cm/s MV Decel Time: 257 msec   187 ms MV E velocity: 93.80 cm/s 103 cm/s  SHUNTS MV A velocity: 94.70 cm/s 70.3 cm/s Systemic VTI:  0.27 m MV E/A ratio:  0.99       1.5       Systemic Diam: 1.70 cm  Dani Gobble Croitoru MD Electronically signed by Sanda Klein MD Signature Date/Time: 12/13/2019/1:46:06 PMThe mitral valve is normal in structure.    Final     PHYSICAL EXAM   Temp:  [98 F (36.7 C)-98.7 F (37.1 C)] 98 F (36.7 C) (01/27 1124) Pulse Rate:  [56-98] 56 (01/27 1124) Resp:  [14-24] 18 (01/27 1124) BP: (119-146)/(41-67) 146/50 (01/27 1124) SpO2:  [95 %-100 %] 98 % (01/27 1124)  General - Well nourished, well developed, not in acute distress.  Ophthalmologic - fundi not visualized due to noncooperation.  Cardiovascular - Regular rhythm and rate.  Neuro - awake alert orientated to self, place but not to time. No aphasia, normal on naming and repetition. Following simple commands. Visual field full but psychomotor slowing on counting fingers. No gaze deviation, PERRL. Mild right nasolabial fold flattening. Tongue midline. LUE 5/5 and RUE 4+/5 proximal and 4/5 finger grip with mild dexterity difficulty. BLEs proximal 4/5 and distal LLE 5/5 and RLE 4/5 DF and PF. Sensation symmetrical, coordination FTN no ataxia but slow on the right and gait not tested.   ASSESSMENT/PLAN [name] is a 74 y.o. female with history of insulin-dependent diabetes mellitus, hypertension, COPD with recent admission for pyelonephritis on 1/12, incidentally found to be COVID-19 positive presenting with increasing fatigue found to have glucose of 42 and R sided weakness.   Stroke:   L basal ganglia infarct, likely secondary to small vessel disease source.   Code Stroke age indeterminate L basal ganglia lacune new since 2015.   MRI  Acute L basal ganglia infarct. Small vessel disease.   MRA  Unremarkable   Carotid Doppler  R 40-59%, L 40-59%  CTA neck right ICA bulb 80%  stenosis. Left VA origin 55% stenosis  2D Echo EF 60-65%  LDL 77  HgbA1c 5.5  Lovenox 40 mg sq daily for VTE prophylaxis  No antithrombotic prior to admission, now on ASA 81 and plavix 49m DAPT. Continue for 3 weeks and then  ASA alone  Therapy recommendations:  SLP, HH PT  Disposition:  pending   COVID infection  asymptomatic found on 11/29/2019.   D-Dimer 1.34->1.12, CRP 14.4->10.6, ESR 105  Repeat COVID test 1/26 positive   Left UE swelling  UE and LE venous doppler no DVT   Encephalopathy   Lethargy with disorientation, intermittent expressive aphasia, confusion  EEG Generalized slow. No sz.  UA neg  afebrile  Could be related to hypoglycemia episode, stroke, hypokalemia   Carotid stenosis, right  CUS right ICA 40-59% stenosis  CTA neck right ICA bulb 80% stenosis  Asymptomatic this time  Will recommend [facility] referral for follow up and monitoring as outpt  Hypertension  Stable . Permissive hypertension (OK if < 220/120) but gradually normalize in 3-5 days . Long-term BP goal normotensive  Hyperlipidemia  Home meds:  lopid, resumed in hospital  LDL 77, goal < 70  Add lipitor 40 - no high intensive  Continue statin on discharge   Diabetes type II Uncontrolled Frequent hypoglycemic events  HgbA1c 5.5, goal < 7.0  CBGs  SSI  Frequent hypoglycemic events - on D5NS - on diet   Other Stroke Risk Factors  Advanced age  Hx Cigarette smoker  Obesity, recommend weight loss, diet and exercise as appropriate   Other Active Problems  Hx cervical cancer s/p XRT 09/1993  Hyperthyroidism, Grave's dz s/p radioiodine - TSH 0.131 and FT4 1.14 - management per primary team  Pyelonephritis adm 1/12-1/15/2021 d/c on Augmentin - still on Augmentin - stop date 1/29 - UA neg this admission  Hospital day # 2  Neurology will sign off. Please call with questions. Pt will follow up with stroke clinic NP at [facility] in about 4 weeks. Thanks for the  consult.   Rosalin Hawking, MD PhD Stroke Neurology 12/14/2019 11:38 AM   To contact Stroke Continuity provider, please refer to [email or web address] After hours, contact General Neurology

## 2019-12-14 NOTE — Progress Notes (Signed)
Physical Therapy Treatment Patient Details Name: [name] MRN: [id number] DOB: 1946/03/02 Today's Date: 12/14/2019    History of Present Illness Pt is a 74 y.o. female with past with history significant for insulin-dependent diabetes mellitus, hypertension, COPD with recent admission for pyelonephritis on 1/12, incidentally found to be COVID-19 positive presents to the emergency department with altered mental status.  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    PT Comments    Pt performed gt training and functional mobility during session with decreased strength and poor cognition.  Daughter adamant to return home and will recommend HHPT at d/c.     Follow Up Recommendations  Home health PT;Supervision/Assistance - 24 hour(refused post acute rehab)     Equipment Recommendations  None recommended by PT    Recommendations for Other Services       Precautions / Restrictions Precautions Precautions: Fall Restrictions Weight Bearing Restrictions: No    Mobility  Bed Mobility Overal bed mobility: Needs Assistance Bed Mobility: Supine to Sit     Supine to sit: Mod assist;+2 for safety/equipment     General bed mobility comments: mod A to scoot forward; assistance for trunk elevation  Transfers Overall transfer level: Needs assistance Equipment used: Rolling walker (2 wheeled) Transfers: Sit to/from [facility] Sit to Stand: Mod assist;+2 safety/equipment;Min assist Stand pivot transfers: Min assist;+2 safety/equipment       General transfer comment: sit to stand x 3; cued for safe hand placement; required increased time and min A to mod assistance to boost; min A for RW management with turns; assist for toileting ADLs  Ambulation/Gait Ambulation/Gait assistance: Min assist;+2 safety/equipment Gait Distance (Feet): 40 Feet(+ 10 ft) Assistive device: Rolling walker (2 wheeled) Gait Pattern/deviations: Decreased stride  length;Narrow base of support;Shuffle Gait velocity: decreased   General Gait Details: Cues for upper trunk control and RW safety.  Continues to fatigue quickly.   Stairs             Wheelchair Mobility    Modified Rankin (Stroke Patients Only)       Balance Overall balance assessment: Needs assistance Sitting-balance support: Bilateral upper extremity supported;Feet supported Sitting balance-Leahy Scale: Good     Standing balance support: Bilateral upper extremity supported;During functional activity Standing balance-Leahy Scale: Poor Standing balance comment: pt initially with posterior lean; cued for upright posture verbally and tactile; improved only requiring min guard                            Cognition Arousal/Alertness: Awake/alert Behavior During Therapy: WFL for tasks assessed/performed Overall Cognitive Status: Impaired/Different from baseline Area of Impairment: Problem solving;Orientation                 Orientation Level: Disoriented to;Time;Situation;Place           Problem Solving: Slow processing;Requires verbal cues;Requires tactile cues General Comments: When given choices she can come to the correct answer.  Daughter very tangential during session.  Pt and daughter adamant to return home.      Exercises      General Comments        Pertinent Vitals/Pain      Home Living                      Prior Function            PT Goals (current goals can now be found in the  care plan section) Acute Rehab PT Goals Patient Stated Goal: return home PT Goal Formulation: With patient/family Potential to Achieve Goals: Fair Progress towards PT goals: Progressing toward goals    Frequency    Min 3X/week      PT Plan Current plan remains appropriate    Co-evaluation              AM-PAC PT "6 Clicks" Mobility   Outcome Measure  Help needed turning from your back to your side while in a flat bed  without using bedrails?: A Lot Help needed moving from lying on your back to sitting on the side of a flat bed without using bedrails?: A Lot Help needed moving to and from a bed to a chair (including a wheelchair)?: A Lot Help needed standing up from a chair using your arms (e.g., wheelchair or bedside chair)?: A Little Help needed to walk in hospital room?: A Little Help needed climbing 3-5 steps with a railing? : A Lot 6 Click Score: 14    End of Session Equipment Utilized During Treatment: Gait belt Activity Tolerance: Patient tolerated treatment well Patient left: with family/visitor present;in chair;with call bell/phone within reach;with chair alarm set Nurse Communication: Mobility status PT Visit Diagnosis: Other abnormalities of gait and mobility (R26.89);Muscle weakness (generalized) (M62.81)     Time: 1724-1800 PT Time Calculation (min) (ACUTE ONLY): 36 min  Charges:  $Gait Training: 8-22 mins                     Erasmo Leventhal , PTA Acute Rehabilitation Services Pager [phone] Office [phone]     Zayden Hahne Eli Hose 12/14/2019, 6:26 PM

## 2019-12-14 NOTE — Progress Notes (Signed)
Paged on call MD to report that her COVID result from test on 12/13/19 came back positive.  She had been positive 14 days prior.  No signs of resp distress.

## 2019-12-14 NOTE — Telephone Encounter (Signed)
Patient due for 6 month [place] scan, patient admitted to the hospital. Patient has had a CT scan during hospital stay. Per Dr Denman George and Lenna Sciara APP; patient doesn't need to have US done

## 2019-12-15 DIAGNOSIS — E039 Hypothyroidism, unspecified: Secondary | ICD-10-CM

## 2019-12-15 LAB — GLUCOSE, CAPILLARY
Glucose-Capillary: 169 mg/dL — ABNORMAL HIGH (ref 70–99)
Glucose-Capillary: 81 mg/dL (ref 70–99)
Glucose-Capillary: 91 mg/dL (ref 70–99)

## 2019-12-15 MED ORDER — ASPIRIN 81 MG PO TBEC: 81.0000 mg | DELAYED_RELEASE_TABLET | Freq: Every day | ORAL | 1 refills | Status: DC

## 2019-12-15 MED ORDER — ATORVASTATIN CALCIUM 40 MG PO TABS: 40.0000 mg | ORAL_TABLET | Freq: Every day | ORAL | 1 refills | Status: DC

## 2019-12-15 MED ORDER — CLOPIDOGREL BISULFATE 75 MG PO TABS: 75.0000 mg | ORAL_TABLET | Freq: Every day | ORAL | 0 refills | Status: DC

## 2019-12-15 MED ORDER — LEVOTHYROXINE SODIUM 100 MCG PO TABS: 100.0000 ug | ORAL_TABLET | Freq: Every day | ORAL | 1 refills | Status: DC

## 2019-12-15 NOTE — Plan of Care (Signed)
Pt alert and oriented x 3 spells of confusion. Pt able to voice needs denies pain. Purewick with amber urine noted in container. Appetite good able to feed self.  X 1 assist with ambulation and transfers with Jaymes Revels. Will continue to monitor

## 2019-12-15 NOTE — TOC Transition Note (Signed)
Transition of Care [facility]) - CM/SW Discharge Note   Patient Details  Name: [name] MRN: [id number] Date of Birth: 26-Feb-1946  [facility] CM/SW Contact:  Pollie Friar, RN Phone Number: 12/15/2019, 11:55 AM   Clinical Narrative:    Pt discharging home with [facility] services through [facility]. Cheryl with [facility] accepted the referral.  No DME needs.  Daughter states family can provide 24 hour supervision at home. Daughter to provide transport home.   Final next level of care: Home w Home Health Services Barriers to Discharge: No Barriers Identified   Patient Goals and CMS Choice   CMS Medicare.gov Compare Post Acute Care list provided to:: Patient Represenative (must comment) Choice offered to / list presented to : Adult Children  Discharge Placement                       Discharge Plan and Services                          HH Arranged: PT, OT, Speech Therapy HH Agency: [facility] Date [facility] Agency Contacted: 12/15/19   Representative spoke with at [place]: [place]) Interventions     Readmission Risk Interventions No flowsheet data found.

## 2019-12-15 NOTE — Discharge Summary (Signed)
Physician Discharge Summary  [name] SWF:[id number] DOB: 11/24/1945 DOA: 12/12/2019  PCP: Everardo Beals, NP  Admit date: 12/12/2019 Discharge date: 12/15/2019  Admitted From: Home Disposition:  Home  Recommendations for Outpatient Follow-up:  1. Follow up with PCP in 1-2 weeks 2. Please obtain BMP/CBC in one week 3. Please follow up with  Neurology 4. Please follow up with vascular surgery    Discharge Condition:stable. CODE STATUS:full code. Diet recommendation: Heart Healthy  Brief/Interim Summary:  74 year old lady with prior history of type 2 diabetes mellitus on insulin, hypertension, COPD, Crohn's disease presents to ED for altered mental status.  On arrival to ED she was found to have right-sided weakness.  CT of the head and MRI was significant for acute CVA and admitted for further evaluation of stroke.   Discharge Diagnoses:  Active Problems:   Hypothyroidism   Type 2 diabetes mellitus ([place])   Hyperlipidemia associated with type 2 diabetes mellitus ([place])   EMPHYSEMA   Essential hypertension   Altered mental status   Hypoglycemia   Left basal ganglia stroke with residual right-sided hemiparesis. MRI of the brain shows acute left basal ganglia infarct MRA is unremarkable Carotid duplex on the right side showed 40 to 50% and on the left shows 40 to 59% stenosis. CTA of the neck shows right ICA bulb 80% stenosis. 2D echocardiogram shows left ventricular ejection fraction of 60 to 65% without any thrombus. LDL is 77, hemoglobin A1c is 5.5 Patient was started on aspirin and Plavix recommend to continue for 3 weeks followed by aspirin alone.  Patient underwent therapy evaluations recommending home health PT and speech follow-up. She also has lower extremity and upper extremity duplex ordered and neg forDVT or svt.   COVID-19 viral infection Asymptomatic and was found to be positive on 11/29/2019.     Acute encephalopathy probably related to  hypoglycemia, stroke. Resolved patient is alert and answering questions.    Right carotid stenosis Recommend vascular surgery follow-up as an outpatient.    Hypertension Permissive hypertension     Hyperlipidemia Goal LDL less than 70, Lipitor 40 mg daily was added.    Type 2 diabetes mellitus uncontrolled with hypoglycemia Liberalize her diet.   Hypothyroidism TSH low with elevated free T4 Decrease the dose of Synthroid from 125 to 100 MCG daily.   Recently diagnosed pyelonephritis Completed the course of Augmentin.        Discharge Instructions  Discharge Instructions    Ambulatory referral to Neurology   Complete by: As directed    Follow up with stroke clinic NP (Jessica Vanschaick or Cecille Rubin, if both not available, consider Zachery Dauer, or [facility]) at [facility] in about 4 weeks. Thanks.   Diet - low sodium heart healthy   Complete by: As directed    Increase activity slowly   Complete by: As directed      Allergies as of 12/15/2019      Reactions   Codeine Nausea Only      Medication List    STOP taking these medications   ALPRAZolam 0.25 MG tablet Commonly known as: XANAX   Dulcolax 5 MG EC tablet Generic drug: bisacodyl   metFORMIN 500 MG tablet Commonly known as: GLUCOPHAGE   oxyCODONE 10 mg 12 hr tablet Commonly known as: Roselind Rily FlexTouch 100 UNIT/ML Sopn FlexTouch Pen Generic drug: insulin degludec     TAKE these medications   acetaminophen 500 MG tablet Commonly known as: TYLENOL Take 500 mg by mouth every 6 (  six) hours as needed (leg pain).   amoxicillin-clavulanate 875-125 MG tablet Commonly known as: Augmentin Take 1 tablet by mouth 2 (two) times daily for 14 days.   aspirin 81 MG EC tablet Take 1 tablet (81 mg total) by mouth daily. Start taking on: December 16, 2019   atorvastatin 40 MG tablet Commonly known as: LIPITOR Take 1 tablet (40 mg total) by mouth daily at 6 PM.    baclofen 10 MG tablet Commonly known as: LIORESAL Take 10 mg by mouth 4 (four) times daily as needed (leg cramps).   clopidogrel 75 MG tablet Commonly known as: PLAVIX Take 1 tablet (75 mg total) by mouth daily. Start taking on: December 16, 2019   ergocalciferol 1.25 MG (50000 UT) capsule Commonly known as: VITAMIN D2 Take 50,000 Units by mouth every Sunday.   gabapentin 300 MG capsule Commonly known as: NEURONTIN Take 900 mg by mouth 2 (two) times daily.   gemfibrozil 600 MG tablet Commonly known as: LOPID Take 600 mg by mouth 2 (two) times daily.   guaiFENesin-dextromethorphan 100-10 MG/5ML syrup Commonly known as: ROBITUSSIN DM Take 5 mLs by mouth every 4 (four) hours as needed for cough.   levothyroxine 100 MCG tablet Commonly known as: SYNTHROID Take 1 tablet (100 mcg total) by mouth daily before breakfast. Start taking on: December 16, 2019 What changed:   medication strength  how much to take   methocarbamol 500 MG tablet Commonly known as: ROBAXIN Take 500 mg by mouth 2 (two) times daily.   oxybutynin 10 MG 24 hr tablet Commonly known as: DITROPAN-XL Take 10 mg by mouth at bedtime.   polyethylene glycol 17 g packet Commonly known as: MiraLax Take 17 g by mouth daily as needed for moderate constipation.   vitamin B-12 1000 MCG tablet Commonly known as: CYANOCOBALAMIN Take 1,000 mcg by mouth daily.   Xtampza ER 9 MG C12a Generic drug: oxyCODONE ER Take 9 mg by mouth every 12 (twelve) hours.      Follow-up Information    [facility]. Schedule an appointment as soon as possible for a visit in 4 week(s).   Specialty: Neurology Contact information: [address] [place] [phone]       Everardo Beals, NP. Schedule an appointment as soon as possible for a visit in 1 week(s).   Contact information: [place] [address] [phone]        [facility]. Schedule an appointment as soon as possible for a visit in 2 week(s).   Specialty: Vascular Surgery Contact information: [place] [address] [phone]         Allergies  Allergen Reactions  . Codeine Nausea Only    Consultations:  Neurology.    Procedures/Studies: CT Head Wo Contrast  Result Date: 12/12/2019 CLINICAL DATA:  74 year old female with altered mental status, increased lethargy for 1 week. Patient was recently hospitalized for UTI and COVID-19. EXAM: CT HEAD WITHOUT CONTRAST TECHNIQUE: Contiguous axial images were obtained from the base of the skull through the vertex without intravenous contrast. COMPARISON:  Head CT 05/27/2014. FINDINGS: Brain: Cerebral volume remains within normal limits for age. No midline shift, mass effect, or evidence of intracranial mass lesion. No ventriculomegaly. Confluent 15 millimeter hypodensity in the left basal ganglia is new since 2015 but age indeterminate. Scattered additional subcortical white matter hypodensity is mildly progressed since 2015. No acute intracranial hemorrhage identified. No cortically based acute infarct  identified. Vascular: Calcified atherosclerosis at the skull base. No suspicious intracranial vascular hyperdensity. Skull: No acute osseous abnormality identified. Sinuses/Orbits: Visualized paranasal sinuses and mastoids are stable and well pneumatized. Small chronic left frontoethmoidal recess osteoma appear stable and inconsequential. Other: No acute orbit or scalp soft tissue findings. IMPRESSION: 1. Age indeterminate lacunar infarct in the left basal ganglia is new since 2015. Query recent right side weakness. 2. No other acute intracranial abnormality identified. Electronically Signed   By: Genevie Ann M.D.   On: 12/12/2019 15:30   CT ANGIO NECK W OR WO CONTRAST  Result Date: 12/14/2019 CLINICAL DATA:  Stroke follow-up EXAM: CT ANGIOGRAPHY NECK TECHNIQUE:  Multidetector CT imaging of the neck was performed using the standard protocol during bolus administration of intravenous contrast. Multiplanar CT image reconstructions and MIPs were obtained to evaluate the vascular anatomy. Carotid stenosis measurements (when applicable) are obtained utilizing NASCET criteria, using the distal internal carotid diameter as the denominator. CONTRAST:  49m OMNIPAQUE IOHEXOL 350 MG/ML SOLN COMPARISON:  None. FINDINGS: Aortic arch: Diffuse atherosclerotic plaque. Three vessel branching. Right carotid system: Atheromatous wall thickening of the brachiocephalic and common carotid which is low-density. Primarily calcified plaque at the bifurcation causes high-grade bulb stenosis which measures 80% on coronal reformats. No ulceration. Prominent calcified plaque on the carotid siphon; at the posterior genu there is 35% stenosis. Left carotid system: Prominent atheromatous plaque at the origin without flow limiting stenosis. Prominent low-density plaque on the anterior wall of the common carotid without flow limiting stenosis. Mild plaque at the bifurcation. No ICA beading or ulceration. Vertebral arteries: Proximal subclavian atherosclerosis on both sides. No flow limiting subclavian stenosis. Atheromatous plaque narrows the left vertebral origin with 55% narrowing based on coronal reformats. No subclavian beading or dissection. Skeleton: Ordinary degenerative changes. Other neck: No significant incidental finding. Upper chest: Subpleural opacities in the apical lungs. Subpleural airspace disease also seen at the bases on recent abdominal CT. IMPRESSION: 1. Diffuse atherosclerosis most notably at the right ICA bulb where there is 80% narrowing. 2. 55% left vertebral origin atheromatous stenosis. The right vertebral artery is slightly dominant. 3. Subpleural pulmonary opacity at the apices correlating with history of COVID-19. Electronically Signed   By: JMonte FantasiaM.D.   On:  12/14/2019 08:49   MR ANGIO HEAD WO CONTRAST  Result Date: 12/13/2019 CLINICAL DATA:  Altered mental status EXAM: MRI HEAD WITHOUT CONTRAST MRA HEAD WITHOUT CONTRAST TECHNIQUE: Multiplanar, multiecho pulse sequences of the brain and surrounding structures were obtained without intravenous contrast. Angiographic images of the head were obtained using MRA technique without contrast. COMPARISON:  Head CT from yesterday FINDINGS: MRI HEAD FINDINGS Brain: Wedge of restricted diffusion in the left basal ganglia spanning the upper putamen and caudate body via the corona radiata. Background of mild chronic small vessel ischemia. No hemorrhage, hydrocephalus, collection, or masslike finding Vascular: Normal flow voids.  Arterial findings below. Skull and upper cervical spine: Normal marrow signal. Sinuses/Orbits: Bilateral cataract resection. Other: Progressively motion degraded. MRA HEAD FINDINGS Unremarkable intracranial anatomy with incomplete circle-of-Willis at the level of the posterior communicating arteries. Mild motion degradation. Tortuous carotid siphons with mild atherosclerotic type irregularity versus flow artifact. No branch occlusion or flow limiting stenosis. Negative for aneurysm. IMPRESSION: 1. Acute perforator infarct at the left basal ganglia. 2. Background mild chronic small vessel ischemia in the cerebral white matter. 3. Negative intracranial MRA. Electronically Signed   By: JMonte FantasiaM.D.   On: 12/13/2019 05:03   MR BRAIN WO CONTRAST  Result Date: 12/13/2019 CLINICAL DATA:  Altered mental status EXAM: MRI HEAD WITHOUT CONTRAST MRA HEAD WITHOUT CONTRAST TECHNIQUE: Multiplanar, multiecho pulse sequences of the brain and surrounding structures were obtained without intravenous contrast. Angiographic images of the head were obtained using MRA technique without contrast. COMPARISON:  Head CT from yesterday FINDINGS: MRI HEAD FINDINGS Brain: Wedge of restricted diffusion in the left basal  ganglia spanning the upper putamen and caudate body via the corona radiata. Background of mild chronic small vessel ischemia. No hemorrhage, hydrocephalus, collection, or masslike finding Vascular: Normal flow voids.  Arterial findings below. Skull and upper cervical spine: Normal marrow signal. Sinuses/Orbits: Bilateral cataract resection. Other: Progressively motion degraded. MRA HEAD FINDINGS Unremarkable intracranial anatomy with incomplete circle-of-Willis at the level of the posterior communicating arteries. Mild motion degradation. Tortuous carotid siphons with mild atherosclerotic type irregularity versus flow artifact. No branch occlusion or flow limiting stenosis. Negative for aneurysm. IMPRESSION: 1. Acute perforator infarct at the left basal ganglia. 2. Background mild chronic small vessel ischemia in the cerebral white matter. 3. Negative intracranial MRA. Electronically Signed   By: Monte Fantasia M.D.   On: 12/13/2019 05:03   CT Abdomen Pelvis W Contrast  Result Date: 12/12/2019 CLINICAL DATA:  Altered mental status EXAM: CT ABDOMEN AND PELVIS WITH CONTRAST TECHNIQUE: Multidetector CT imaging of the abdomen and pelvis was performed using the standard protocol following bolus administration of intravenous contrast. CONTRAST:  13m OMNIPAQUE IOHEXOL 300 MG/ML  SOLN COMPARISON:  11/29/2019 noncontrast study FINDINGS: Lower chest: Partially imaged bibasilar peripheral opacities, which may reflect atelectasis or recent COVID infection. Hepatobiliary: No focal liver abnormality is seen. No gallstones, gallbladder wall thickening, or biliary dilatation. Pancreas: Unremarkable. Spleen: Unremarkable. Adrenals/Urinary Tract: Bladder is mildly distended and partially obscured by artifact from hip arthroplasty. The distal ureters are also not well seen. Otherwise, fullness of the ureters and renal collecting systems, which may related to bladder distension. Adrenals are normal. Stomach/Bowel: Bowel is  normal in caliber. Colon is primarily fluid-filled. Appendix is not visualized. Vascular/Lymphatic: Marked aortoiliac and branch atherosclerosis. No adenopathy. Reproductive: Endometrial canal remains distended with fluid. No adnexal mass. Other: No new abdominal wall finding.  No ascites. Musculoskeletal: Left hip arthroplasty is present with associated streak artifact. Degenerative changes of the spine. No acute osseous abnormality IMPRESSION: Mild fullness renal collecting systems and proximal to mid ureters, which may related to bladder distension. Normal caliber but primarily fluid-filled colon. Stable findings of hematometra. Electronically Signed   By: PMacy MisM.D.   On: 12/12/2019 15:39   DG Chest Port 1 View  Result Date: 12/12/2019 CLINICAL DATA:  COVID, altered mental status EXAM: PORTABLE CHEST 1 VIEW COMPARISON:  2019 FINDINGS: Interstitial prominence, likely chronic. No pleural effusion or pneumothorax. Stable cardiomediastinal contours with normal heart size. IMPRESSION: Mild interstitial prominence, which appears chronic. Electronically Signed   By: PMacy MisM.D.   On: 12/12/2019 10:59   EEG adult  Result Date: 12/14/2019 YLora Havens MD     12/14/2019  9:12 AM Patient Name: [name] QINMRN: [id number] Attending: PLora HavensReferring Physician/Provider: Dr JRosalin HawkingDate: 12/13/2019 Duration: 21.47 mins Patient history: 73yo F, COVID+ presenting with increasing fatigue found to have glucose of 42 and R sided weakness. Patient continues to be encephalopathic. EEG to evaluate for seizure. Level of alertness: awake AEDs during EEG study: Gabapentin Technical aspects: This EEG study was done with scalp electrodes positioned according to the 10-20 International system of electrode placement. Electrical activity was  acquired at a sampling rate of 500Hz  and reviewed with a high frequency filter of 70Hz  and a low frequency filter of 1Hz . EEG data were recorded  continuously and digitally stored. DESCRIPTION:The posterior dominant rhythm consists of 8 Hz activity of moderate voltage (25-35 uV) seen predominantly in posterior head regions, symmetric and reactive to eye opening and eye closing. EEG also showed continuous generalized 3-5Hz  theta-delta slowing. Triphasic waves, generalized, maximal bifrontal were also noted. Hyperventilation and photic stimulation were not performed. ABNORMALITY - Triphasic waves, generalized, maximal bifrontal - Continuous slow, generalized  IMPRESSION: This study is suggestive of mild diffuse encephalopathy, non specific to etiology but could be secondary to toxic-metabolic causes. No seizures or epileptiform discharges were seen throughout the recording.  Lora Havens   CT RENAL STONE STUDY  Result Date: 11/29/2019 CLINICAL DATA:  Flank pain with kidney stone suspected EXAM: CT ABDOMEN AND PELVIS WITHOUT CONTRAST TECHNIQUE: Multidetector CT imaging of the abdomen and pelvis was performed following the standard protocol without IV contrast. COMPARISON:  07/26/2019 and pelvis MRI 06/29/2019 FINDINGS: Lower chest:  No contributory findings. Hepatobiliary: No focal liver abnormality.No evidence of biliary obstruction or stone. Pancreas: Generalized atrophy. Spleen: Unremarkable. Adrenals/Urinary Tract: Negative adrenals. No hydronephrosis or stone. Asymmetric right renal cortical thinning and lobulation attributed to scarring. Unremarkable bladder. Stomach/Bowel: No obstruction. No appendicitis. Formed stool throughout the colon. Vascular/Lymphatic: No acute vascular abnormality. Atherosclerotic calcification. No mass or adenopathy. Reproductive:Low-density dilatation of the endometrial cavity with coarse calcification at the level of the cervix, hematometra by pelvis MRI. No acute or interval finding. Other: No ascites or pneumoperitoneum. Musculoskeletal: No acute abnormalities. Left hip arthroplasty. Lumbar spine degeneration with  L4-5 anterolisthesis. IMPRESSION: 1. No acute finding. 2. Hematometra as evaluated on recent pelvis MRI. 3. Right renal cortical scarring. 4.  Aortic Atherosclerosis (ICD10-I70.0). Electronically Signed   By: Monte Fantasia M.D.   On: 11/29/2019 07:20   VAS US CAROTID (at [facility] and [facility] only)  Result Date: 12/14/2019 Carotid Arterial Duplex Study Indications:       CVA. Risk Factors:      Hypertension, hyperlipidemia, Diabetes. Other Factors:     COPD. Comparison Study:  No prior study. Performing Technologist: Baldwin Crown ARDMS, RVT  Examination Guidelines: A complete evaluation includes B-mode imaging, spectral Doppler, color Doppler, and power Doppler as needed of all accessible portions of each vessel. Bilateral testing is considered an integral part of a complete examination. Limited examinations for reoccurring indications may be performed as noted.  Right Carotid Findings: +----------+--------+--------+--------+------------------+--------+           PSV cm/sEDV cm/sStenosisPlaque DescriptionComments +----------+--------+--------+--------+------------------+--------+ CCA Prox  91      14                                         +----------+--------+--------+--------+------------------+--------+ CCA Distal81      13                                         +----------+--------+--------+--------+------------------+--------+ ICA Prox  204     38      40-59%  heterogenous               +----------+--------+--------+--------+------------------+--------+ ICA Mid   162     25                                         +----------+--------+--------+--------+------------------+--------+  ICA Distal199     27                                         +----------+--------+--------+--------+------------------+--------+ ECA       276     18              heterogenous               +----------+--------+--------+--------+------------------+--------+  +----------+--------+-------+----------------+-------------------+           PSV cm/sEDV cmsDescribe        Arm Pressure (mmHG) +----------+--------+-------+----------------+-------------------+ [id number]            Multiphasic, WNL                    +----------+--------+-------+----------------+-------------------+ +---------+--------+--+--------+--+---------+ VertebralPSV cm/s74EDV cm/s14Antegrade +---------+--------+--+--------+--+---------+  Left Carotid Findings: +----------+--------+--------+--------+------------------+--------+           PSV cm/sEDV cm/sStenosisPlaque DescriptionComments +----------+--------+--------+--------+------------------+--------+ CCA Prox  119     17                                         +----------+--------+--------+--------+------------------+--------+ CCA Distal164     20                                         +----------+--------+--------+--------+------------------+--------+ ICA Prox  180     29      40-59%  heterogenous               +----------+--------+--------+--------+------------------+--------+ ICA Mid   149     21                                         +----------+--------+--------+--------+------------------+--------+ ICA Distal129     23                                         +----------+--------+--------+--------+------------------+--------+ ECA       187     21              heterogenous               +----------+--------+--------+--------+------------------+--------+ +----------+--------+--------+--------+-------------------+           PSV cm/sEDV cm/sDescribeArm Pressure (mmHG) +----------+--------+--------+--------+-------------------+ [id number]             Elevated                    +----------+--------+--------+--------+-------------------+ +---------+--------+--+--------+--+---------+ VertebralPSV cm/s53EDV cm/s10Antegrade  +---------+--------+--+--------+--+---------+   Summary: Right Carotid: Velocities in the right ICA are consistent with a 40-59%                stenosis. Left Carotid: Velocities in the left ICA are consistent with a 40-59% stenosis. Vertebrals:  Bilateral vertebral arteries demonstrate antegrade flow. Subclavians: Left subclavian artery flow was elevated, but did not appear              stenotic. Normal flow hemodynamics were seen in the right  subclavian artery. *See table(s) above for measurements and observations.  Electronically signed by Antony Contras MD on 12/14/2019 at 8:24:28 AM.    Final    VAS [place] LOWER EXTREMITY VENOUS (DVT)  Result Date: 12/15/2019  Lower Venous Study Indications: Elevated d-dimer.  Limitations: Ambient light interference. Comparison Study: No prior study Performing Technologist: Maudry Mayhew MHA, RDMS, RVT, RDCS  Examination Guidelines: A complete evaluation includes B-mode imaging, spectral Doppler, color Doppler, and power Doppler as needed of all accessible portions of each vessel. Bilateral testing is considered an integral part of a complete examination. Limited examinations for reoccurring indications may be performed as noted.  +---------+---------------+---------+-----------+----------+--------------+ RIGHT    CompressibilityPhasicitySpontaneityPropertiesThrombus Aging +---------+---------------+---------+-----------+----------+--------------+ CFV      Full           Yes      Yes                                 +---------+---------------+---------+-----------+----------+--------------+ SFJ      Full                                                        +---------+---------------+---------+-----------+----------+--------------+ FV Prox  Full                                                        +---------+---------------+---------+-----------+----------+--------------+ FV Mid   Full                                                         +---------+---------------+---------+-----------+----------+--------------+ FV DistalFull                                                        +---------+---------------+---------+-----------+----------+--------------+ PFV      Full                                                        +---------+---------------+---------+-----------+----------+--------------+ POP      Full           Yes      Yes                                 +---------+---------------+---------+-----------+----------+--------------+ PTV      Full                                                        +---------+---------------+---------+-----------+----------+--------------+  PERO     Full                                                        +---------+---------------+---------+-----------+----------+--------------+   +-------+---------------+---------+-----------+----------+--------------+ LEFT   CompressibilityPhasicitySpontaneityPropertiesThrombus Aging +-------+---------------+---------+-----------+----------+--------------+ CFV    Full           Yes      Yes                                 +-------+---------------+---------+-----------+----------+--------------+ SFJ    Full                                                        +-------+---------------+---------+-----------+----------+--------------+ FV ProxFull                                                        +-------+---------------+---------+-----------+----------+--------------+ FV Mid Full                                                        +-------+---------------+---------+-----------+----------+--------------+ POP    Full           Yes      Yes                                 +-------+---------------+---------+-----------+----------+--------------+ PTV    Full                                                         +-------+---------------+---------+-----------+----------+--------------+ PERO   Full                                                        +-------+---------------+---------+-----------+----------+--------------+   Left Technical Findings: Not visualized segments include left distal femoral vein.   Summary: Right: There is no evidence of deep vein thrombosis in the lower extremity. However, portions of this examination were limited- see technologist comments above. No cystic structure found in the popliteal fossa. Left: There is no evidence of deep vein thrombosis in the lower extremity. However, portions of this examination were limited- see technologist comments above. No cystic structure found in the popliteal fossa.  *See table(s) above for measurements and observations. Electronically signed by Monica [place] MD on 12/15/2019 at 4:12:08 PM.    Final    VAS [place] UPPER EXTREMITY VENOUS DUPLEX  Result Date: 12/15/2019 UPPER VENOUS STUDY  Indications: Swelling Comparison Study: No prior study Performing Technologist: Maudry Mayhew MHA, RDMS, RVT, RDCS  Examination Guidelines: A complete evaluation includes B-mode imaging, spectral Doppler, color Doppler, and power Doppler as needed of all accessible portions of each vessel. Bilateral testing is considered an integral part of a complete examination. Limited examinations for reoccurring indications may be performed as noted.  Right Findings: +----------+------------+---------+-----------+----------+-------+ RIGHT     CompressiblePhasicitySpontaneousPropertiesSummary +----------+------------+---------+-----------+----------+-------+ IJV           Full       Yes       Yes                      +----------+------------+---------+-----------+----------+-------+ Subclavian    Full       Yes       Yes                      +----------+------------+---------+-----------+----------+-------+ Axillary      Full       Yes       Yes                       +----------+------------+---------+-----------+----------+-------+ Brachial      Full       Yes       Yes                      +----------+------------+---------+-----------+----------+-------+ Radial        Full                                          +----------+------------+---------+-----------+----------+-------+ Ulnar         Full                                          +----------+------------+---------+-----------+----------+-------+ Cephalic      Full                                          +----------+------------+---------+-----------+----------+-------+ Basilic       Full                                          +----------+------------+---------+-----------+----------+-------+  Left Findings: +----------+------------+---------+-----------+----------+-------+ LEFT      CompressiblePhasicitySpontaneousPropertiesSummary +----------+------------+---------+-----------+----------+-------+ Subclavian               Yes       Yes                      +----------+------------+---------+-----------+----------+-------+  Summary:  Right: No evidence of deep vein thrombosis in the upper extremity. No evidence of superficial vein thrombosis in the upper extremity.  Left: No evidence of thrombosis in the subclavian.  *See table(s) above for measurements and observations.  Diagnosing physician: Monica [place] MD Electronically signed by Monica [place] MD on 12/15/2019 at 4:08:51 PM.    Final    ECHOCARDIOGRAM LIMITED  Result Date: 12/13/2019   ECHOCARDIOGRAM LIMITED REPORT   Patient Name:   [name] Date of Exam: 12/13/2019 Medical Rec #:  [id number]        Height:       61.0 in Accession #:    [id number]       Weight:       165.0 lb Date of Birth:  February 19, 1946        BSA:          1.74 m Patient Age:    20 years         BP:           138/61 mmHg Patient Gender: F                HR:           73 bpm. Exam Location:  Inpatient  Procedure:  Limited Echo Indications:    Stroke 434.91 / I163.9  History:        Patient has prior history of Echocardiogram examinations, most                 recent 12/02/2019. Sepsis.  Sonographer:    Vikki Ports Turrentine Referring Phys: [id number] [place] R [place] IMPRESSIONS  1. Left ventricular ejection fraction, by visual estimation, is 60 to 65%. The left ventricle has normal function. There is no increased left ventricular wall thickness.  2. The left ventricle has no regional wall motion abnormalities.  3. Global right ventricle has normal systolc function.The right ventricular size is normal. no increase in right ventricular wall thickness.  4. The mitral valve is normal in structure. No evidence of mitral valve regurgitation. No evidence of mitral stenosis.  5. The tricuspid valve was normal in structure. Tricuspid valve regurgitation is trivial.  6. Tricuspid valve regurgitation is trivial.  7. No evidence of aortic valve sclerosis or stenosis.  8. The pulmonic valve was normal in structure.  9. Normal pulmonary artery systolic pressure. 10. The inferior vena cava is normal in size with greater than 50% respiratory variability, suggesting right atrial pressure of 3 mmHg. 11. No significant change from prior study. 12. Prior images reviewed side by side. FINDINGS  Left Ventricle: Left ventricular ejection fraction, by visual estimation, is 60 to 65%. The left ventricle has normal function. The left ventricle has no regional wall motion abnormalities. The left ventricular internal cavity size was the left ventricle is normal in size. There is no increased left ventricular wall thickness. Left ventricular diastolic parameters were normal. Normal left atrial pressure. Right Ventricle: The right ventricular size is normal. No increase in right ventricular wall thickness. Global RV systolic function is has normal systolic function. The tricuspid regurgitant velocity is 2.08 m/s, and with an assumed right atrial pressure  of 3  mmHg, the estimated right ventricular systolic pressure is normal at 20.3 mmHg. Left Atrium: Left atrial size was normal in size. Right Atrium: Right atrial size was normal in size. Right atrial pressure is estimated at 3 mmHg. Pericardium: There is no evidence of pericardial effusion is seen. There is no evidence of pericardial effusion. Mitral Valve: The mitral valve is normal in structure. No evidence of mitral valve stenosis by observation. MV Area by PHT, 2.95 cm. MV PHT, 74.53 msec. No evidence of mitral valve regurgitation. Tricuspid Valve: The tricuspid valve is normal in structure. Tricuspid valve regurgitation is trivial. Aortic Valve: The aortic valve is normal in structure. Aortic valve regurgitation is not visualized. The aortic valve is structurally normal, with no evidence of sclerosis or stenosis. Pulmonic Valve: The pulmonic valve was normal in structure. Pulmonic valve regurgitation is not visualized by  color flow Doppler. Pulmonic regurgitation is not visualized by color flow Doppler. Aorta: The aortic root, ascending aorta and aortic arch are all structurally normal, with no evidence of dilitation or obstruction. Venous: The inferior vena cava is normal in size with greater than 50% respiratory variability, suggesting right atrial pressure of 3 mmHg. Shunts: There is no evidence of a patent foramen ovale. No ventricular septal defect is seen or detected. There is no evidence of an atrial septal defect. No atrial level shunt detected by color flow Doppler.  LEFT VENTRICLE          Normals PLAX 2D LVOT diam:     1.70 cm  2.0 cm LVOT Area:     2.27 cm 3.14 cm2  AORTIC VALVE             Normals LVOT Vmax:   124.00 cm/s LVOT Vmean:  75.800 cm/s 75 cm/s LVOT VTI:    0.274 m     25.3 cm MITRAL VALVE              Normals   TRICUSPID VALVE             Normals MV Area (PHT): 2.95 cm             TR Peak grad:   17.3 mmHg MV PHT:        74.53 msec 55 ms     TR Vmax:        208.00 cm/s 288 cm/s MV Decel  Time: 257 msec   187 ms MV E velocity: 93.80 cm/s 103 cm/s  SHUNTS MV A velocity: 94.70 cm/s 70.3 cm/s Systemic VTI:  0.27 m MV E/A ratio:  0.99       1.5       Systemic Diam: 1.70 cm  Dani Gobble Croitoru MD Electronically signed by Sanda Klein MD Signature Date/Time: 12/13/2019/1:46:06 PMThe mitral valve is normal in structure.    Final    ECHOCARDIOGRAM LIMITED  Result Date: 12/02/2019   ECHOCARDIOGRAM LIMITED REPORT   Patient Name:   [name] Date of Exam: 12/02/2019 Medical Rec #:  [id number]        Height:       61.0 in Accession #:    [id number]       Weight:       165.0 lb Date of Birth:  07-25-1946        BSA:          1.74 m Patient Age:    70 years         BP:           153/78 mmHg Patient Gender: F                HR:           70 bpm. Exam Location:  Inpatient  Procedure: Limited Echo, Limited Color Doppler and Cardiac Doppler Indications:    Bacteremia 790.7 / R78.81  History:        Patient has prior history of Echocardiogram examinations, most                 recent 08/07/2017. COVID 19. bilateral lower extremity edema.                 thyroid disease.  Sonographer:    Darlina Sicilian RDCS Referring Phys: [id number] [place]  1. Left ventricular ejection fraction, by visual estimation, is 60 to 65%. The left ventricle has normal function.  2. Global right ventricle has normal systolic function.The right ventricular size is normal.  3. The mitral valve is normal in structure. No evidence of mitral valve regurgitation.  4. The tricuspid valve was normal in structure. Tricuspid valve regurgitation is not demonstrated.  5. The aortic valve is tricuspid. Aortic valve regurgitation is not visualized. The aortic valve is structurally normal, with no evidence of sclerosis or stenosis.  6. The pulmonic valve was not well visualized. Pulmonic valve regurgitation is not visualized by color flow Doppler.  7. The inferior vena cava is normal in size with greater than 50% respiratory  variability, suggesting right atrial pressure of 3 mmHg.  8. No vegetation seen. If high clinical suspicion for endocarditis, recommend TEE FINDINGS  Left Ventricle: Left ventricular ejection fraction, by visual estimation, is 60 to 65%. The left ventricle has normal function. Right Ventricle: The right ventricular size is normal. Global RV systolic function is has normal systolic function. Pericardium: There is no evidence of pericardial effusion is seen. There is no evidence of pericardial effusion. Mitral Valve: The mitral valve is normal in structure. MV Area by PHT, 4.96 cm. MV PHT, 44.37 msec. No evidence of mitral valve regurgitation. Tricuspid Valve: The tricuspid valve is normal in structure. Tricuspid valve regurgitation is not demonstrated. Aortic Valve: The aortic valve is tricuspid. Aortic valve regurgitation is not visualized. The aortic valve is structurally normal, with no evidence of sclerosis or stenosis. Pulmonic Valve: The pulmonic valve was not well visualized. Pulmonic valve regurgitation is not visualized by color flow Doppler. Pulmonic regurgitation is not visualized by color flow Doppler. Venous: The inferior vena cava is normal in size with greater than 50% respiratory variability, suggesting right atrial pressure of 3 mmHg.  LEFT VENTRICLE          Normals PLAX 2D LVOT diam:     1.60 cm  2.0 cm   Diastology                 Normals LVOT Area:     2.01 cm 3.14 cm2 LV e' lateral:   4.13 cm/s 6.42 cm/s                                  LV E/e' lateral: 13.9      15.4                                  LV e' medial:    4.90 cm/s 6.96 cm/s                                  LV E/e' medial:  11.7      6.96  AORTIC VALVE             Normals LVOT Vmax:   84.20 cm/s LVOT Vmean:  55.500 cm/s 75 cm/s LVOT VTI:    0.140 m     25.3 cm MITRAL VALVE              Normals MV Area (PHT): 4.96 cm             SHUNTS MV PHT:        44.37 msec 55 ms     Systemic VTI:  0.14 m MV Decel Time: 153 msec   187 ms  Systemic Diam: 1.60 cm MV E velocity: 57.40 cm/s 103 cm/s MV A velocity: 53.60 cm/s 70.3 cm/s MV E/A ratio:  1.07       1.5  Oswaldo Milian MD Electronically signed by Oswaldo Milian MD Signature Date/Time: 12/02/2019/11:35:29 AMThe mitral valve is normal in structure.    Final     echocardiogram.  Subjective: No new complaints. No chest pain or sob.  Discharge Exam: Vitals:   12/15/19 0811 12/15/19 1139  BP: (!) 156/57 (!) 143/61  Pulse: 90 70  Resp: 20 16  Temp: 98 F (36.7 C) 98 F (36.7 C)  SpO2: 100% 98%   Vitals:   12/14/19 2326 12/15/19 0320 12/15/19 0811 12/15/19 1139  BP: 132/70 (!) 141/54 (!) 156/57 (!) 143/61  Pulse:   90 70  Resp:   20 16  Temp: 97.8 F (36.6 C) 98 F (36.7 C) 98 F (36.7 C) 98 F (36.7 C)  TempSrc: Oral Oral Oral Oral  SpO2:   100% 98%    General: Pt is alert, awake, not in acute distress Cardiovascular: RRR, S1/S2 +, no rubs, no gallops Respiratory: CTA bilaterally, no wheezing, no rhonchi Abdominal: Soft, NT, ND, bowel sounds + Extremities: no edema, no cyanosis    The results of significant diagnostics from this hospitalization (including imaging, microbiology, ancillary and laboratory) are listed below for reference.     Microbiology: Recent Results (from the past 240 hour(s))  Culture, blood (routine x 2)     Status: None (Preliminary result)   Collection Time: 12/12/19 11:00 AM   Specimen: BLOOD RIGHT WRIST  Result Value Ref Range Status   Specimen Description BLOOD RIGHT WRIST  Final   Special Requests   Final    BOTTLES DRAWN AEROBIC AND ANAEROBIC Blood Culture results may not be optimal due to an inadequate volume of blood received in culture bottles   Culture   Final    NO GROWTH 3 DAYS Performed at [facility], [place] [address] [place], [place] [address]    Report Status PENDING  Incomplete  Culture, blood (routine x 2)     Status: None (Preliminary result)   Collection Time: 12/12/19 11:31 AM    Specimen: BLOOD LEFT HAND  Result Value Ref Range Status   Specimen Description BLOOD LEFT HAND  Final   Special Requests   Final    BOTTLES DRAWN AEROBIC ONLY Blood Culture results may not be optimal due to an inadequate volume of blood received in culture bottles   Culture   Final    NO GROWTH 3 DAYS Performed at [facility], [place] [address], [place], [place] [address]    Report Status PENDING  Incomplete  Urine culture     Status: None (Preliminary result)   Collection Time: 12/12/19  2:07 PM   Specimen: Urine, Random  Result Value Ref Range Status   Specimen Description URINE, RANDOM  Final   Special Requests NONE  Final   Culture   Final    NO GROWTH Performed at [facility], [place] [address] [place], [place] [address]    Report Status PENDING  Incomplete  SARS CORONAVIRUS 2 (TAT 6-24 HRS) Nasopharyngeal Nasopharyngeal Swab     Status: Abnormal   Collection Time: 12/13/19  5:28 AM   Specimen: Nasopharyngeal Swab  Result Value Ref Range Status   SARS Coronavirus 2 POSITIVE (A) NEGATIVE Final    Comment: RESULT CALLED TO, READ BACK BY AND VERIFIED WITH: RN S BLEDSOE @0358   12/14/19 BY S GEZAHEGN (NOTE) SARS-CoV-2 target nucleic acids are DETECTED. The SARS-CoV-2 RNA is generally detectable in upper and lower respiratory specimens during the acute phase of infection. Positive results are indicative of the presence of SARS-CoV-2 RNA. Clinical correlation with patient history and other diagnostic information is  necessary to determine patient infection status. Positive results do not rule out bacterial infection or co-infection with other viruses.  The expected result is Negative. Fact Sheet for Patients: SugarRoll.be Fact Sheet for Healthcare Providers: https://www.woods-mathews.com/ This test is not yet approved or cleared by the [place] FDA and  has been authorized for detection and/or diagnosis of SARS-CoV-2 by FDA  under an Emergency Use Authorization (EUA). This EUA will remain  in effect (meaning this test can be used) fo r the duration of the COVID-19 declaration under Section 564(b)(1) of the Act, 21 U.S.C. section 360bbb-3(b)(1), unless the authorization is terminated or revoked sooner. Performed at [facility], [place] [address], [place], [place] [address]   Respiratory Panel by RT PCR (Flu A&B, Covid) - Nasopharyngeal Swab     Status: Abnormal   Collection Time: 12/14/19  9:05 AM   Specimen: Nasopharyngeal Swab  Result Value Ref Range Status   SARS Coronavirus 2 by RT PCR POSITIVE (A) NEGATIVE Final    Comment: RESULT CALLED TO, READ BACK BY AND VERIFIED WITH: Alvira Philips RN 10:55 12/14/19 ([email or web address]) (NOTE) SARS-CoV-2 target nucleic acids are DETECTED. SARS-CoV-2 RNA is generally detectable in upper respiratory specimens  during the acute phase of infection. Positive results are indicative of the presence of the identified virus, but do not rule out bacterial infection or co-infection with other pathogens not detected by the test. Clinical correlation with patient history and other diagnostic information is necessary to determine patient infection status. The expected result is Negative. Fact Sheet for Patients:  PinkCheek.be Fact Sheet for Healthcare Providers: GravelBags.it This test is not yet approved or cleared by the [place] FDA and  has been authorized for detection and/or diagnosis of SARS-CoV-2 by FDA under an Emergency Use Authorization (EUA).  This EUA will remain in effect (meaning this test can be used)  for the duration of  the COVID-19 declaration under Section 564(b)(1) of the Act, 21 U.S.C. section 360bbb-3(b)(1), unless the authorization is terminated or revoked sooner.    Influenza A by PCR NEGATIVE NEGATIVE Final   Influenza B by PCR NEGATIVE NEGATIVE Final    Comment: (NOTE) The Xpert Xpress  SARS-CoV-2/FLU/RSV assay is intended as an aid in  the diagnosis of influenza from Nasopharyngeal swab specimens and  should not be used as a sole basis for treatment. Nasal washings and  aspirates are unacceptable for Xpert Xpress SARS-CoV-2/FLU/RSV  testing. Fact Sheet for Patients: PinkCheek.be Fact Sheet for Healthcare Providers: GravelBags.it This test is not yet approved or cleared by the [place] FDA and  has been authorized for detection and/or diagnosis of SARS-CoV-2 by  FDA under an Emergency Use Authorization (EUA). This EUA will remain  in effect (meaning this test can be used) for the duration of the  Covid-19 declaration under Section 564(b)(1) of the Act, 21  U.S.C. section 360bbb-3(b)(1), unless the authorization is  terminated or revoked. Performed at [facility], [place] [address], [place], [place] [address]      Labs: BNP (last 3 results) No results for input(s): BNP in the last 8760 hours. Basic Metabolic Panel: Recent Labs  Lab 12/12/19 1036 12/12/19 2234 12/13/19 0246 12/13/19 4076 12/13/19 8088  12/14/19 0421  NA 140  --  141 144  --  144  K 3.2*  --  2.5* 2.6*  --  4.5  CL 106  --  107 109  --  114*  CO2 20*  --  21* 22  --  21*  GLUCOSE 42* 134* 37* 119*  --  125*  BUN 11  --  7* 8  --  7*  CREATININE 0.73  --  0.67 0.73  --  0.80  CALCIUM 8.8*  --  8.5* 8.5*  --  8.4*  MG  --   --   --   --  1.4*  --    Liver Function Tests: Recent Labs  Lab 12/12/19 1036  AST 26  ALT 18  ALKPHOS 161*  BILITOT 0.5  PROT 6.7  ALBUMIN 2.6*   No results for input(s): LIPASE, AMYLASE in the last 168 hours. No results for input(s): AMMONIA in the last 168 hours. CBC: Recent Labs  Lab 12/12/19 1036 12/14/19 0421  WBC 7.8 4.5  NEUTROABS 6.3  --   HGB 11.7* 10.5*  HCT 35.8* 33.2*  MCV 85.6 86.9  PLT 292 305   Cardiac Enzymes: No results for input(s): CKTOTAL, CKMB, CKMBINDEX,  TROPONINI in the last 168 hours. BNP: Invalid input(s): POCBNP CBG: Recent Labs  Lab 12/14/19 1125 12/14/19 1802 12/15/19 0011 12/15/19 0620 12/15/19 1137  GLUCAP 111* 248* 169* 91 81   D-Dimer Recent Labs    12/13/19 0734 12/14/19 0421  DDIMER 1.34* 1.12*   Hgb A1c Recent Labs    12/14/19 0421  HGBA1C 5.5   Lipid Profile Recent Labs    12/14/19 0421  CHOL 117  HDL 24*  LDLCALC 77  TRIG 79  CHOLHDL 4.9   Thyroid function studies Recent Labs    12/14/19 0421  TSH 0.131*   Anemia work up No results for input(s): VITAMINB12, FOLATE, FERRITIN, TIBC, IRON, RETICCTPCT in the last 72 hours. Urinalysis    Component Value Date/Time   COLORURINE YELLOW 12/12/2019 1407   APPEARANCEUR HAZY (A) 12/12/2019 1407   LABSPEC 1.019 12/12/2019 1407   PHURINE 5.0 12/12/2019 1407   GLUCOSEU 50 (A) 12/12/2019 1407   HGBUR NEGATIVE 12/12/2019 1407   [place]erve 12/12/2019 1407   KETONESUR NEGATIVE 12/12/2019 1407   PROTEINUR 30 (A) 12/12/2019 1407   UROBILINOGEN 0.2 05/27/2014 1203   NITRITE NEGATIVE 12/12/2019 1407   LEUKOCYTESUR NEGATIVE 12/12/2019 1407   Sepsis Labs Invalid input(s): PROCALCITONIN,  WBC,  LACTICIDVEN Microbiology Recent Results (from the past 240 hour(s))  Culture, blood (routine x 2)     Status: None (Preliminary result)   Collection Time: 12/12/19 11:00 AM   Specimen: BLOOD RIGHT WRIST  Result Value Ref Range Status   Specimen Description BLOOD RIGHT WRIST  Final   Special Requests   Final    BOTTLES DRAWN AEROBIC AND ANAEROBIC Blood Culture results may not be optimal due to an inadequate volume of blood received in culture bottles   Culture   Final    NO GROWTH 3 DAYS Performed at [facility], [place] [address] [place], [place] [address]    Report Status PENDING  Incomplete  Culture, blood (routine x 2)     Status: None (Preliminary result)   Collection Time: 12/12/19 11:31 AM   Specimen: BLOOD LEFT HAND  Result Value Ref  Range Status   Specimen Description BLOOD LEFT HAND  Final   Special Requests   Final    BOTTLES  DRAWN AEROBIC ONLY Blood Culture results may not be optimal due to an inadequate volume of blood received in culture bottles   Culture   Final    NO GROWTH 3 DAYS Performed at [facility], [place] [address] [place], [place] [address]    Report Status PENDING  Incomplete  Urine culture     Status: None (Preliminary result)   Collection Time: 12/12/19  2:07 PM   Specimen: Urine, Random  Result Value Ref Range Status   Specimen Description URINE, RANDOM  Final   Special Requests NONE  Final   Culture   Final    NO GROWTH Performed at [facility], [place] [address] [place], [place] [address]    Report Status PENDING  Incomplete  SARS CORONAVIRUS 2 (TAT 6-24 HRS) Nasopharyngeal Nasopharyngeal Swab     Status: Abnormal   Collection Time: 12/13/19  5:28 AM   Specimen: Nasopharyngeal Swab  Result Value Ref Range Status   SARS Coronavirus 2 POSITIVE (A) NEGATIVE Final    Comment: RESULT CALLED TO, READ BACK BY AND VERIFIED WITH: RN S BLEDSOE @0358  12/14/19 BY S GEZAHEGN (NOTE) SARS-CoV-2 target nucleic acids are DETECTED. The SARS-CoV-2 RNA is generally detectable in upper and lower respiratory specimens during the acute phase of infection. Positive results are indicative of the presence of SARS-CoV-2 RNA. Clinical correlation with patient history and other diagnostic information is  necessary to determine patient infection status. Positive results do not rule out bacterial infection or co-infection with other viruses.  The expected result is Negative. Fact Sheet for Patients: SugarRoll.be Fact Sheet for Healthcare Providers: https://www.woods-mathews.com/ This test is not yet approved or cleared by the [place] FDA and  has been authorized for detection and/or diagnosis of SARS-CoV-2 by FDA under an Emergency Use Authorization (EUA).  This EUA will remain  in effect (meaning this test can be used) fo r the duration of the COVID-19 declaration under Section 564(b)(1) of the Act, 21 U.S.C. section 360bbb-3(b)(1), unless the authorization is terminated or revoked sooner. Performed at [facility], [place] [address], [place], [place] [address]   Respiratory Panel by RT PCR (Flu A&B, Covid) - Nasopharyngeal Swab     Status: Abnormal   Collection Time: 12/14/19  9:05 AM   Specimen: Nasopharyngeal Swab  Result Value Ref Range Status   SARS Coronavirus 2 by RT PCR POSITIVE (A) NEGATIVE Final    Comment: RESULT CALLED TO, READ BACK BY AND VERIFIED WITH: Alvira Philips RN 10:55 12/14/19 ([email or web address]) (NOTE) SARS-CoV-2 target nucleic acids are DETECTED. SARS-CoV-2 RNA is generally detectable in upper respiratory specimens  during the acute phase of infection. Positive results are indicative of the presence of the identified virus, but do not rule out bacterial infection or co-infection with other pathogens not detected by the test. Clinical correlation with patient history and other diagnostic information is necessary to determine patient infection status. The expected result is Negative. Fact Sheet for Patients:  PinkCheek.be Fact Sheet for Healthcare Providers: GravelBags.it This test is not yet approved or cleared by the [place] FDA and  has been authorized for detection and/or diagnosis of SARS-CoV-2 by FDA under an Emergency Use Authorization (EUA).  This EUA will remain in effect (meaning this test can be used)  for the duration of  the COVID-19 declaration under Section 564(b)(1) of the Act, 21 U.S.C. section 360bbb-3(b)(1), unless the authorization is terminated or revoked sooner.    Influenza A by PCR NEGATIVE NEGATIVE Final  Influenza B by PCR NEGATIVE NEGATIVE Final    Comment: (NOTE) The Xpert Xpress SARS-CoV-2/FLU/RSV assay is intended as an aid in   the diagnosis of influenza from Nasopharyngeal swab specimens and  should not be used as a sole basis for treatment. Nasal washings and  aspirates are unacceptable for Xpert Xpress SARS-CoV-2/FLU/RSV  testing. Fact Sheet for Patients: PinkCheek.be Fact Sheet for Healthcare Providers: GravelBags.it This test is not yet approved or cleared by the [place] FDA and  has been authorized for detection and/or diagnosis of SARS-CoV-2 by  FDA under an Emergency Use Authorization (EUA). This EUA will remain  in effect (meaning this test can be used) for the duration of the  Covid-19 declaration under Section 564(b)(1) of the Act, 21  U.S.C. section 360bbb-3(b)(1), unless the authorization is  terminated or revoked. Performed at [facility], [place] [address], [place], [place] [address]      Time coordinating discharge: 34  minutes  SIGNED:   Hosie Poisson, MD  [facility] 12/15/2019, 7:50 PM

## 2019-12-17 LAB — CULTURE, BLOOD (ROUTINE X 2)
Culture: NO GROWTH
Culture: NO GROWTH

## 2019-12-17 LAB — URINE CULTURE: Culture: NO GROWTH

## 2019-12-28 ENCOUNTER — Other Ambulatory Visit: Payer: Self-pay | Admitting: *Deleted

## 2019-12-28 NOTE — Patient Outreach (Signed)
[name]) Care Management  12/28/2019  [name] 1946-06-21 [id number]   Subjective: Telephone call to patient's home number, no answer, left HIPAA compliant voicemail message, and requested call back.    Objective: Per KPN (Knowledge Performance Now, point of care tool) and chart review, patient hospitalized 12/12/2019 - 12/15/2019 for Acute CVA, altered mental status, right side weakness, Left basal ganglia stroke with residual right-sided hemiparesis.  Patient hospitalized 11/29/2019 - 12/02/2019 for Acute UTI.   Patient also has a history of diabetes on insulin, hypertension, hyperlipidemia, COPD, Crohn's disease, Hypothyroidism, EMPHYSEMA, Hypoglycemia, Covid positive 11/29/2019, Right carotid stenosis, pyelonephritis, cervical cancer, and Graves' disease.     Assessment:  Received [name] Stroke Red Flag Alert follow up referral on 12/23/2019.  Red Flag Alert Trigger, Day #6, patient answered yes to the following question: Feeling worse overall?  [name] follow up pending patient contact.      Plan: RNCM will send unsuccessful outreach letter, [name] pamphlet, handout: Know Before You Go, will call patient for 2nd telephone outreach attempt within 4 business days, [name] [name] follow up, and proceed with case closure, within 10 business days if no return call.     [name], BSN, [place] [name] Telephonic CM Phone: [phone] Fax: [phone]

## 2019-12-30 ENCOUNTER — Other Ambulatory Visit: Payer: Self-pay | Admitting: *Deleted

## 2019-12-30 NOTE — Patient Outreach (Signed)
[facility]) Care Management  12/30/2019  [name] December 17, 1945 [id number]   Subjective: Telephone call to patient's home number, spoke with female answering phone, states [name] is taking a nap, will not disturb, left HIPAA compliant message for [name], and requested call back.   Objective: Per KPN (Knowledge Performance Now, point of care tool) and chart review, patient hospitalized 12/12/2019 - 12/15/2019 for Acute CVA, altered mental status, right side weakness, Left basal ganglia stroke with residual right-sided hemiparesis.  Patient hospitalized 11/29/2019 - 12/02/2019 for Acute UTI.   Patient also has a history of diabetes on insulin, hypertension, hyperlipidemia, COPD, Crohn's disease, Hypothyroidism, EMPHYSEMA, Hypoglycemia, Covid positive 11/29/2019, Right carotid stenosis, pyelonephritis, cervical cancer, and Graves' disease.     Assessment:  Received [name] EMMI Stroke Red Flag Alert follow up referral on 12/23/2019.  Red Flag Alert Trigger, Day #6, patient answered yes to the following question: Feeling worse overall?  [facility] EMMI follow up pending patient contact.      Plan: RNCM has sent unsuccessful outreach letter, [facility] pamphlet, handout: Know Before You Go, will call patient for 3rd telephone outreach attempt within 4 business days, [facility] EMMI follow up, and proceed with case closure, within 10 business days if no return call.    [name] Bob H. Annia Friendly, BSN, [place] Management [facility] Telephonic CM Phone: [phone] Fax: [phone]

## 2020-01-02 ENCOUNTER — Other Ambulatory Visit: Payer: Self-pay | Admitting: *Deleted

## 2020-01-02 ENCOUNTER — Encounter: Payer: Self-pay | Admitting: *Deleted

## 2020-01-02 NOTE — Patient Outreach (Signed)
[place] [facility]) Care Management  01/02/2020  KIMARA BENCOMO [birth year] [id number]   Subjective: Telephone call to patient's home / mobile number, spoke with patient, and HIPAA verified.    Discussed [facility] Care Management [employer] Medicare EMMI Stroke Red Alert Flag follow up, patient voiced understanding, and is in agreement to follow up.   Spoke with patient, gave verbal consent to [facility] to speak with daughter [name]) regarding patient's healthcare needs as needed.  Patient states she doing pretty better, still has a UTI, was feeling worse, but is now better.  States she does not remember receiving EMMI automated calls, completed most of assessment with RNCM, and then asked RNCM to complete remainder of assessment with her daughter [name]).  States her daughter is currently at her home, comes by everyday to check on her, and her husband.  Patient states she is able to manage self care and has assistance as needed.  Spoke with patient's daughter Joelene Millin discussed [place] Management [employer] Medicare EMMI Stroke Red Alert Flag follow up, daughter voiced understanding, and is in agreement to follow upon patient's behalf.  States she is accessing patient's [employer] Medicare benefits on patient's behalf, as needed via member services number on back of card.   Daughter states she has worked at [employer] for the last 18 years, is aware stroke symptoms, her mother was asymptomatic prior stroke, only symptoms were confusion, and uncontrollable blood sugar.  States patient also tested positive for Covid 19 and was asymptomatic.   Daughter states she is aware of signs/ symptoms to report, how to reach provider if needed after hours, when to go to ED, and / or call 911.  States she has spoken with the neurologist while patient was in the hospital, was advised by MD, some women do not present with typical stroke symptoms, and is aware of how her mother presented.  States patient is doing  much better overall, still being treated for UTI, and her memory has been affected.  Daughter voices understanding of patient's medical diagnosis and treatment plan.   States patient is receiving home health services (physical therapy, occupational therapy), and services are going well.  Daughter states patient had hospital follow up appointment with primary provider on 12/28/2019 and appointment went well.  States she also has the following appointments scheduled: with orthopedic MD on 01/04/2020, with vascular surgeon on 01/10/2020, and with neurologist on 01/17/2020.   Daughter states she has family medical leave act  (FMLA) benefit in place to check on her parents as needed and brother is also assisting.  Daugher states patient does not have any education material, EMMI follow up, care coordination, care management, disease monitoring, transportation, [employer], or pharmacy needs at this time.  States she is very appreciative of the follow up, is in agreement  to receive 1 additional follow up call to assess for further CM needs, and is in agreement to receive [place] Management EMMI follow up calls as needed, on patient's behalf.     Objective:Per KPN (Knowledge Performance Now, point of care tool) and chart review,patient hospitalized 12/12/2019 - 12/15/2019 for Acute CVA, altered mental status, right side weakness,Left basal ganglia stroke with residual right-sided hemiparesis. Patient hospitalized 11/29/2019 - 12/02/2019 for Acute UTI. Patient also has a history of diabetes on insulin, hypertension, hyperlipidemia, COPD, Crohn's disease,Hypothyroidism,EMPHYSEMA,Hypoglycemia, Covid positive 11/29/2019,Right carotid stenosis,pyelonephritis, cervical cancer, and Graves' disease.     Assessment: Received [place] Medicare EMMI Stroke Red Flag Alert follow up referral on  12/23/2019. Red Flag Alert Trigger, Day #6, patient answered yes to the following question: Feeling worse overall? EMMI  follow up completed and will follow up to assess further care management needs.     Plan: RNCM will call patient or patient's daughter [name]) for telephone outreach attempt, within 21 business days, EMMI follow up, to assess for further CM needs, and proceed with case closure, within 10 business days if no return call.      Verdene Creson H. Annia Friendly, BSN, [place] Management [name] Telephonic CM Phone: [phone] Fax: [phone]

## 2020-01-09 ENCOUNTER — Telehealth (HOSPITAL_COMMUNITY): Payer: Self-pay

## 2020-01-09 NOTE — Telephone Encounter (Signed)

## 2020-01-10 ENCOUNTER — Encounter: Payer: Self-pay | Admitting: Vascular Surgery

## 2020-01-10 ENCOUNTER — Ambulatory Visit (INDEPENDENT_AMBULATORY_CARE_PROVIDER_SITE_OTHER): Payer: Medicare HMO | Admitting: Vascular Surgery

## 2020-01-10 ENCOUNTER — Other Ambulatory Visit: Payer: Self-pay

## 2020-01-10 VITALS — BP 135/57 | HR 68 | Temp 98.0°F | Resp 18 | Ht 60.0 in | Wt 155.7 lb

## 2020-01-10 DIAGNOSIS — I6521 Occlusion and stenosis of right carotid artery: Secondary | ICD-10-CM | POA: Diagnosis not present

## 2020-01-10 NOTE — Progress Notes (Signed)
Vascular and Vein Specialist of [place]  Patient name: [name] MRN: [id number] DOB: 04-15-1946 Sex: female  REASON FOR CONSULT: Evaluation asymptomatic right carotid stenosis  HPI: [name] is a 74 y.o. female, who is seen today for evaluation of asymptomatic right carotid stenosis.  She has a very complex past history.  She was admitted to [facility] 1 month ago with a left brain stroke.  She had an extensive work-up for evaluation of this.  She had a CT angiogram which showed insignificant smooth narrowing of her left carotid system.  She has a 80% focal right carotid stenosis at the carotid bifurcation.  She has never had right brain symptoms.  She is right-handed.  She has had affect from her stroke to her speech and right sided weakness.  She is walking with a rolling walker.  She does have some word searching but this has improved since her initial admission.  She was also found to have positive Covid test although she was asymptomatic on her admission to the hospital.  She has multiple other medical issues as listed below.  She does have some disability related to prior bilateral hip replacements.  Underwent radiation therapy for cervical cancer 25 years ago.  She also reports recent difficulty with kidney stones requiring lithotripsy and stent placement  Past Medical History:  Diagnosis Date  . Arthritis   . Bilateral lower extremity edema   . Flank pain   . Full dentures   . History of cervical cancer    11/ 1994  Stage IB  s/p  high dose radiation brachytherapy @ [facility] 01/ 1995--- per pt no recurrence  . History of chronic bronchitis   . History of hyperthyroidism    d 03/ 2011---due to grave's disease--- s/p  RAI treatment 04/ 2011  . History of sepsis 05/03/2018   due to UTI with klebsiella/ pyelonephritis/ ureteral obstruction cause by stone  . Hyperlipidemia   . Hypothyroidism, postradioiodine therapy    endocrinologist--  dr Loanne Drilling--  dx graves disease and s/p RAI i131 treatement 4/ 2011  . Nauseated   . Oral thrush 05/03/2018  . Pneumonia   . Type 2 diabetes mellitus treated with insulin ([place])    FOLLOWED BY PCP  . Urgency of urination   . Wears glasses     Family History  Problem Relation Age of Onset  . Emphysema Father   . Diabetes Father   . Emphysema Sister   . Emphysema Brother   . Cancer Mother        Skin Cancer  . Diabetes Mother     SOCIAL HISTORY: Social History   Socioeconomic History  . Marital status: Married    Spouse name: Not on file  . Number of children: 2  . Years of education: Not on file  . Highest education level: Not on file  Occupational History  . Occupation: Disabled  Tobacco Use  . Smoking status: Former Smoker    Years: 5.00    Types: Cigarettes    Quit date: 05/13/1982    Years since quitting: 37.6  . Smokeless tobacco: Never Used  Substance and Sexual Activity  . Alcohol use: No  . Drug use: No  . Sexual activity: Not Currently  Other Topics Concern  . Not on file  Social History Narrative   Married and lives with husband   Has children   Housewife   disabled   Social Determinants of Radio broadcast assistant  Strain:   . Difficulty of Paying Living Expenses: Not on file  Food Insecurity:   . Worried About Charity fundraiser in the Last Year: Not on file  . Ran Out of Food in the Last Year: Not on file  Transportation Needs:   . Lack of Transportation (Medical): Not on file  . Lack of Transportation (Non-Medical): Not on file  Physical Activity:   . Days of Exercise per Week: Not on file  . Minutes of Exercise per Session: Not on file  Stress:   . Feeling of Stress : Not on file  Social Connections:   . Frequency of Communication with Friends and Family: Not on file  . Frequency of Social Gatherings with Friends and Family: Not on file  . Attends Religious Services: Not on file  . Active Member of Clubs or  Organizations: Not on file  . Attends Archivist Meetings: Not on file  . Marital Status: Not on file  Intimate Partner Violence:   . Fear of Current or Ex-Partner: Not on file  . Emotionally Abused: Not on file  . Physically Abused: Not on file  . Sexually Abused: Not on file    Allergies  Allergen Reactions  . Codeine Nausea Only    Current Outpatient Medications  Medication Sig Dispense Refill  . acetaminophen (TYLENOL) 500 MG tablet Take 500 mg by mouth every 6 (six) hours as needed (leg pain).    Marland Kitchen aspirin EC 81 MG EC tablet Take 1 tablet (81 mg total) by mouth daily. 30 tablet 1  . atorvastatin (LIPITOR) 40 MG tablet Take 1 tablet (40 mg total) by mouth daily at 6 PM. 30 tablet 1  . baclofen (LIORESAL) 10 MG tablet Take 10 mg by mouth 4 (four) times daily as needed (leg cramps).     . clopidogrel (PLAVIX) 75 MG tablet Take 1 tablet (75 mg total) by mouth daily. 21 tablet 0  . ergocalciferol (VITAMIN D2) 50000 UNITS capsule Take 50,000 Units by mouth every Sunday.     . gabapentin (NEURONTIN) 300 MG capsule Take 900 mg by mouth 2 (two) times daily.     Marland Kitchen gemfibrozil (LOPID) 600 MG tablet Take 600 mg by mouth 2 (two) times daily.      Marland Kitchen guaiFENesin-dextromethorphan (ROBITUSSIN DM) 100-10 MG/5ML syrup Take 5 mLs by mouth every 4 (four) hours as needed for cough. 118 mL 0  . methocarbamol (ROBAXIN) 500 MG tablet Take 500 mg by mouth 2 (two) times daily.     Marland Kitchen oxybutynin (DITROPAN-XL) 10 MG 24 hr tablet Take 10 mg by mouth at bedtime.     Marland Kitchen oxyCODONE ER (XTAMPZA ER) 9 MG C12A Take 9 mg by mouth every 12 (twelve) hours.    . polyethylene glycol (MIRALAX) 17 g packet Take 17 g by mouth daily as needed for moderate constipation. 14 each 0  . vitamin B-12 (CYANOCOBALAMIN) 1000 MCG tablet Take 1,000 mcg by mouth daily.    Marland Kitchen levothyroxine (SYNTHROID) 100 MCG tablet Take 1 tablet (100 mcg total) by mouth daily before breakfast. (Patient not taking: Reported on 01/10/2020) 30 tablet  1   No current facility-administered medications for this visit.    REVIEW OF SYSTEMS:  [X]  denotes positive finding, [ ]  denotes negative finding Cardiac  Comments:  Chest pain or chest pressure:    Shortness of breath upon exertion:    Short of breath when lying flat:    Irregular heart rhythm:  Vascular    Pain in calf, thigh, or hip brought on by ambulation:    Pain in feet at night that wakes you up from your sleep:     Blood clot in your veins:    Leg swelling:         Pulmonary    Oxygen at home:    Productive cough:     Wheezing:         Neurologic    Sudden weakness in arms or legs:  x   Sudden numbness in arms or legs:  x   Sudden onset of difficulty speaking or slurred speech: x   Temporary loss of vision in one eye:     Problems with dizziness:         Gastrointestinal    Blood in stool:     Vomited blood:         Genitourinary    Burning when urinating:     Blood in urine:        Psychiatric    Major depression:         Hematologic    Bleeding problems:    Problems with blood clotting too easily:        Skin    Rashes or ulcers:        Constitutional    Fever or chills:      PHYSICAL EXAM: Vitals:   01/10/20 1430  BP: (!) 135/57  Pulse: 68  Resp: 18  Temp: 98 F (36.7 C)  TempSrc: Temporal  SpO2: 99%  Weight: 155 lb 11.2 oz (70.6 kg)  Height: 5' (1.524 m)    GENERAL: The patient is a well-nourished female, in no acute distress. The vital signs are documented above. CARDIOVASCULAR: 2+ radial and 2+ dorsalis pedis pulses bilaterally.  I do not hear carotid bruit PULMONARY: There is good air exchange  ABDOMEN: Soft and non-tender  MUSCULOSKELETAL: There are no major deformities or cyanosis. NEUROLOGIC: Word searching with her speech and also weak in her right hand versus her left SKIN: There are no ulcers or rashes noted. PSYCHIATRIC: The patient has a normal affect.  DATA:  CT angiogram reviewed.  This does show focal  calcified high-grade stenosis of the right bifurcation  MEDICAL ISSUES: Had long discussion with the patient.  I have recommended right carotid endarterectomy for reduction of stroke risk related to her severe asymptomatic right internal carotid artery stenosis.  I have recommended deferring this for several months to allow her to continue to recover from her recent stroke.  I described the procedure and the expected overnight hospitalization.  Also discussed 1 to 2% risk of stroke with surgery.  She understands and I will see [place] back in the office for further discussion in 2 months and we will plan on surgery following that assuming continued recovery   Rosetta Posner, MD [facility] Vascular and Vein Specialists of [facility] Tel [phone] Pager [phone]

## 2020-01-16 ENCOUNTER — Encounter: Payer: Self-pay | Admitting: Podiatry

## 2020-01-16 ENCOUNTER — Other Ambulatory Visit: Payer: Self-pay

## 2020-01-16 ENCOUNTER — Ambulatory Visit: Payer: Medicare HMO | Admitting: Podiatry

## 2020-01-16 ENCOUNTER — Ambulatory Visit: Payer: Medicare HMO | Attending: Internal Medicine

## 2020-01-16 VITALS — Temp 97.0°F

## 2020-01-16 DIAGNOSIS — M79674 Pain in right toe(s): Secondary | ICD-10-CM

## 2020-01-16 DIAGNOSIS — L84 Corns and callosities: Secondary | ICD-10-CM

## 2020-01-16 DIAGNOSIS — M79675 Pain in left toe(s): Secondary | ICD-10-CM | POA: Diagnosis not present

## 2020-01-16 DIAGNOSIS — Z23 Encounter for immunization: Secondary | ICD-10-CM

## 2020-01-16 DIAGNOSIS — E1142 Type 2 diabetes mellitus with diabetic polyneuropathy: Secondary | ICD-10-CM

## 2020-01-16 DIAGNOSIS — B351 Tinea unguium: Secondary | ICD-10-CM

## 2020-01-16 NOTE — Patient Instructions (Addendum)
PLEASE DO NOT SHAVE THE HAIR ON YOUR TOES OR YOUR FEET ANYMORE.  APPLY TRIPLE ANTIBIOTIC OINTMENT TO RIGHT GREAT, RIGHT 2ND AND LEFT 2ND TOES ONCE DAILY FOR 2 WEEKS.   Diabetes Mellitus and Foot Care Foot care is an important part of your health, especially when you have diabetes. Diabetes may cause you to have problems because of poor blood flow (circulation) to your feet and legs, which can cause your skin to:  Become thinner and drier.  Break more easily.  Heal more slowly.  Peel and crack. You may also have nerve damage (neuropathy) in your legs and feet, causing decreased feeling in them. This means that you may not notice minor injuries to your feet that could lead to more serious problems. Noticing and addressing any potential problems early is the best way to prevent future foot problems. How to care for your feet Foot hygiene  Wash your feet daily with warm water and mild soap. Do not use hot water. Then, pat your feet and the areas between your toes until they are completely dry. Do not soak your feet as this can dry your skin.  Trim your toenails straight across. Do not dig under them or around the cuticle. File the edges of your nails with an emery board or nail file.  Apply a moisturizing lotion or petroleum jelly to the skin on your feet and to dry, brittle toenails. Use lotion that does not contain alcohol and is unscented. Do not apply lotion between your toes. Shoes and socks  Wear clean socks or stockings every day. Make sure they are not too tight. Do not wear knee-high stockings since they may decrease blood flow to your legs.  Wear shoes that fit properly and have enough cushioning. Always look in your shoes before you put them on to be sure there are no objects inside.  To break in new shoes, wear them for just a few hours a day. This prevents injuries on your feet. Wounds, scrapes, corns, and calluses  Check your feet daily for blisters, cuts, bruises, sores,  and redness. If you cannot see the bottom of your feet, use a mirror or ask someone for help.  Do not cut corns or calluses or try to remove them with medicine.  If you find a minor scrape, cut, or break in the skin on your feet, keep it and the skin around it clean and dry. You may clean these areas with mild soap and water. Do not clean the area with peroxide, alcohol, or iodine.  If you have a wound, scrape, corn, or callus on your foot, look at it several times a day to make sure it is healing and not infected. Check for: ? Redness, swelling, or pain. ? Fluid or blood. ? Warmth. ? Pus or a bad smell. General instructions  Do not cross your legs. This may decrease blood flow to your feet.  Do not use heating pads or hot water bottles on your feet. They may burn your skin. If you have lost feeling in your feet or legs, you may not know this is happening until it is too late.  Protect your feet from hot and cold by wearing shoes, such as at the beach or on hot pavement.  Schedule a complete foot exam at least once a year (annually) or more often if you have foot problems. If you have foot problems, report any cuts, sores, or bruises to your health care provider immediately. Contact a health  care provider if:  You have a medical condition that increases your risk of infection and you have any cuts, sores, or bruises on your feet.  You have an injury that is not healing.  You have redness on your legs or feet.  You feel burning or tingling in your legs or feet.  You have pain or cramps in your legs and feet.  Your legs or feet are numb.  Your feet always feel cold.  You have pain around a toenail. Get help right away if:  You have a wound, scrape, corn, or callus on your foot and: ? You have pain, swelling, or redness that gets worse. ? You have fluid or blood coming from the wound, scrape, corn, or callus. ? Your wound, scrape, corn, or callus feels warm to the touch. ? You  have pus or a bad smell coming from the wound, scrape, corn, or callus. ? You have a fever. ? You have a red line going up your leg. Summary  Check your feet every day for cuts, sores, red spots, swelling, and blisters.  Moisturize feet and legs daily.  Wear shoes that fit properly and have enough cushioning.  If you have foot problems, report any cuts, sores, or bruises to your health care provider immediately.  Schedule a complete foot exam at least once a year (annually) or more often if you have foot problems. This information is not intended to replace advice given to you by your health care provider. Make sure you discuss any questions you have with your health care provider. Document Revised: 07/27/2019 Document Reviewed: 12/05/2016 Elsevier Patient Education  [place].

## 2020-01-16 NOTE — Progress Notes (Signed)
   Covid-19 Vaccination Clinic  Name:  [name]    MRN: [id number] DOB: September 24, 1946  01/16/2020  [name] was observed post Covid-19 immunization for 15 minutes without incidence. She was provided with Vaccine Information Sheet and instruction to access the V-Safe system.   [name] was instructed to call 911 with any severe reactions post vaccine: Marland Kitchen Difficulty breathing  . Swelling of your face and throat  . A fast heartbeat  . A bad rash all over your body  . Dizziness and weakness    Immunizations Administered    Name Date Dose VIS Date Route   Pfizer COVID-19 Vaccine 01/16/2020 11:46 AM 0.3 mL 10/28/2019 Intramuscular   Manufacturer: [place]   Lot: [id number]   [place]orter: [id number]

## 2020-01-17 ENCOUNTER — Encounter: Payer: Self-pay | Admitting: Adult Health

## 2020-01-17 ENCOUNTER — Inpatient Hospital Stay: Payer: Medicare HMO | Admitting: Adult Health

## 2020-01-17 ENCOUNTER — Ambulatory Visit: Payer: Medicare HMO | Admitting: Adult Health

## 2020-01-17 VITALS — BP 134/60 | HR 72 | Temp 97.4°F | Ht 60.0 in | Wt 159.2 lb

## 2020-01-17 DIAGNOSIS — I639 Cerebral infarction, unspecified: Secondary | ICD-10-CM | POA: Diagnosis not present

## 2020-01-17 DIAGNOSIS — I69320 Aphasia following cerebral infarction: Secondary | ICD-10-CM

## 2020-01-17 DIAGNOSIS — E11649 Type 2 diabetes mellitus with hypoglycemia without coma: Secondary | ICD-10-CM | POA: Diagnosis not present

## 2020-01-17 DIAGNOSIS — I6521 Occlusion and stenosis of right carotid artery: Secondary | ICD-10-CM

## 2020-01-17 DIAGNOSIS — I10 Essential (primary) hypertension: Secondary | ICD-10-CM

## 2020-01-17 DIAGNOSIS — I69359 Hemiplegia and hemiparesis following cerebral infarction affecting unspecified side: Secondary | ICD-10-CM

## 2020-01-17 DIAGNOSIS — I6381 Other cerebral infarction due to occlusion or stenosis of small artery: Secondary | ICD-10-CM

## 2020-01-17 DIAGNOSIS — E785 Hyperlipidemia, unspecified: Secondary | ICD-10-CM

## 2020-01-17 NOTE — Progress Notes (Signed)
I agree with the above plan 

## 2020-01-17 NOTE — Patient Instructions (Addendum)
Continue aspirin 81 mg daily  and lipitor  for secondary stroke prevention  Recommend stopping plavix as not indicated from stroke stand point - please reach out to Dr. Donnetta Hutching in regards to continuation prior to stopping if needed from a vascular standpoint   Continue to follow up with PCP regarding cholesterol, blood pressure and diabetes management   Continue therapies - will have speech therapy start in addition physical therapy  Continue to monitor blood pressure at home  Maintain strict control of hypertension with blood pressure goal below 130/90, diabetes with hemoglobin A1c goal below 6.5% and cholesterol with LDL cholesterol (bad cholesterol) goal below 70 mg/dL. I also advised the patient to eat a healthy diet with plenty of whole grains, cereals, fruits and vegetables, exercise regularly and maintain ideal body weight.  Followup in the future with me in 3 months or call earlier if needed       Thank you for coming to see [place] at [facility]. I hope we have been able to provide you high quality care today.  You may receive a patient satisfaction survey over the next few weeks. We would appreciate your feedback and comments so that we may continue to improve ourselves and the health of our patients.

## 2020-01-17 NOTE — Progress Notes (Signed)
[facility] [address] [place]. [place] [address] [phone]       HOSPITAL FOLLOW UP NOTE  [name] Date of Birth:  12/16/1945 Medical Record Number:  [id number]   Reason for Referral:  hospital stroke follow up    CHIEF COMPLAINT:  Chief Complaint  Patient presents with  . Hospitalization Follow-up    Stroke follow-up.  Daughter present. Treatment room. No new concerns at this time.     HPI: [name] being seen today for in office hospital follow-up regarding left basal ganglia infarct secondary to small vessel disease on 12/13/2019.  History obtained from patient, daughter and chart review. Reviewed all radiology images and labs personally.  [name] is a 74 y.o. female with history of insulin-dependent diabetes mellitus, hypertension, COPD with recent admission for pyelonephritison 1/12,incidentally found to be COVID-19 positive presented on 12/13/2019 with increasing fatigue found to have glucose of 42 and R sided weakness.  Evaluated by stroke team and Dr. Erlinda Hong with stroke work-up revealing left basal ganglia infarct likely secondary to small vessel disease source.  CTA neck showed right ICA bulb 80% stenosis and left VA origin 55% stenosis.  Carotid Doppler right 40 to 59% stenosis and left 40 to 59% stenosis.  MRA head unremarkable.  2D echo unremarkable.  Recommended DAPT for 3 weeks and aspirin alone.  Repeat Covid test 1/26 positive.  Encephalopathy with EEG showed generalized slowing felt likely related to hypoglycemia episode, stroke and hypokalemia.  Right carotid stenosis asymptomatic at present time and recommended follow-up with [facility] outpatient.  HTN stable.  LDL 77 and initiated atorvastatin 40 mg daily.  Type 2 diabetes with frequent hypoglycemic events and A1c 5.5.  Current tobacco use with smoking cessation counseling provided.  Other stroke risk factors include advanced age and obesity but no prior history of stroke.   Other active problems include history of cervical cancer s/p XRT 1994, hypothyroidism and pyelonephritis.  Discharged home in stable condition recommendation home health SLP and PT.  [name] is a 74 year old female who is being seen today for hospital follow-up accompanied by her daughter.  Residual deficits of mild expressive aphasia and right sided weakness but overall greatly improving. Currently working with home health PT and nursing.  Ambulates with Rollator walker without recent falls.  She was not seen/evaluated by speech therapy as recommend. She does live with husband but family will stop in to check on her throughout the day. She continues on aspirin, plavix and atorvastatin for secondary stroke prevention without side effects.  Blood pressure today 134/60.  She did have evaluation by the vascular surgery Dr. Donnetta Hutching who recommended proceeding with right carotid endarterectomy for severe asymptomatic right ICA stenosis and recommended awaiting several months for stroke recovery.  No further concerns at this time.    ROS:   14 system review of systems performed and negative with exception of weakness, speech difficulty and gait impairment  PMH:  Past Medical History:  Diagnosis Date  . Arthritis   . Bilateral lower extremity edema   . Flank pain   . Full dentures   . History of cervical cancer    11/ 1994  Stage IB  s/p  high dose radiation brachytherapy @ [facility] 01/ 1995--- per pt no recurrence  . History of chronic bronchitis   . History of hyperthyroidism    d 03/ 2011---due to grave's disease--- s/p  RAI treatment 04/ 2011  . History of sepsis 05/03/2018  due to UTI with klebsiella/ pyelonephritis/ ureteral obstruction cause by stone  . Hyperlipidemia   . Hypothyroidism, postradioiodine therapy    endocrinologist--  dr Loanne Drilling--  dx graves disease and s/p RAI i131 treatement 4/ 2011  . Nauseated   . Oral thrush 05/03/2018  . Pneumonia   . Type 2 diabetes mellitus treated  with insulin ([place])    FOLLOWED BY PCP  . Urgency of urination   . Wears glasses     PSH:  Past Surgical History:  Procedure Laterality Date  . APPENDECTOMY    . CATARACT EXTRACTION W/ INTRAOCULAR LENS  IMPLANT, BILATERAL  2004  approx.  . CYSTOSCOPY WITH STENT PLACEMENT Right 05/04/2018   Procedure: CYSTOSCOPY WITH STENT PLACEMENT AND RETROGRADE PYELOGRAM;  Surgeon: Ceasar Mons, MD;  Location: [facility];  Service: Urology;  Laterality: Right;  . CYSTOSCOPY/URETEROSCOPY/HOLMIUM LASER/STENT PLACEMENT Right 05/19/2018   Procedure: CYSTOSCOPY, URETEROSCOPY/HOLMIUM LASER, STONE BASKETRY/ STENT EXCHANGE;  Surgeon: Ceasar Mons, MD;  Location: [facility];  Service: Urology;  Laterality: Right;  . EXCISION MASS LEFT CHEST WALL  09-20-2007   dr Grandville Silos  [facility]  . Radioactive Iodine Therapy     for thyroid  . REVISION TOTAL HIP ARTHROPLASTY Left early 2000s  . TANDEM RING INSERTION  1995   dr Aldean Ast @ [facility]   EUA w/ tandem placement in ovid  (for direct high dose radiation brachytherapy , cervical cancer)  . TOTAL HIP ARTHROPLASTY Left 1990s  . TRANSTHORACIC ECHOCARDIOGRAM  08/07/2017   mild focal basal hypertrophy of the septum,  ef 09-73%, grade 1 diastolic dysfunction/  atrial septum with lipomatous hypertrophy/  trivial PR    Social History:  Social History   Socioeconomic History  . Marital status: Married    Spouse name: Not on file  . Number of children: 2  . Years of education: Not on file  . Highest education level: Not on file  Occupational History  . Occupation: Disabled  Tobacco Use  . Smoking status: Former Smoker    Years: 5.00    Types: Cigarettes    Quit date: 05/13/1982    Years since quitting: 37.7  . Smokeless tobacco: Never Used  Substance and Sexual Activity  . Alcohol use: No  . Drug use: No  . Sexual activity: Not Currently  Other Topics Concern  . Not on file  Social History Narrative   Married and lives with  husband   Has children   Housewife   disabled   Social Determinants of Health   Financial Resource Strain:   . Difficulty of Paying Living Expenses: Not on file  Food Insecurity:   . Worried About Charity fundraiser in the Last Year: Not on file  . Ran Out of Food in the Last Year: Not on file  Transportation Needs:   . Lack of Transportation (Medical): Not on file  . Lack of Transportation (Non-Medical): Not on file  Physical Activity:   . Days of Exercise per Week: Not on file  . Minutes of Exercise per Session: Not on file  Stress:   . Feeling of Stress : Not on file  Social Connections:   . Frequency of Communication with Friends and Family: Not on file  . Frequency of Social Gatherings with Friends and Family: Not on file  . Attends Religious Services: Not on file  . Active Member of Clubs or Organizations: Not on file  . Attends Archivist Meetings: Not on file  . Marital  Status: Not on file  Intimate Partner Violence:   . Fear of Current or Ex-Partner: Not on file  . Emotionally Abused: Not on file  . Physically Abused: Not on file  . Sexually Abused: Not on file    Family History:  Family History  Problem Relation Age of Onset  . Emphysema Father   . Diabetes Father   . Emphysema Sister   . Emphysema Brother   . Cancer Mother        Skin Cancer  . Diabetes Mother     Medications:   Current Outpatient Medications on File Prior to Visit  Medication Sig Dispense Refill  . acetaminophen (TYLENOL) 500 MG tablet Take 500 mg by mouth as needed (leg pain).     Marland Kitchen aspirin EC 81 MG EC tablet Take 1 tablet (81 mg total) by mouth daily. 30 tablet 1  . atorvastatin (LIPITOR) 40 MG tablet Take 1 tablet (40 mg total) by mouth daily at 6 PM. 30 tablet 1  . baclofen (LIORESAL) 10 MG tablet Take 10 mg by mouth 4 (four) times daily as needed (leg cramps).     . clopidogrel (PLAVIX) 75 MG tablet Take 1 tablet (75 mg total) by mouth daily. 21 tablet 0  .  ergocalciferol (VITAMIN D2) 50000 UNITS capsule Take 50,000 Units by mouth every Sunday.     . gabapentin (NEURONTIN) 300 MG capsule Take 900 mg by mouth 2 (two) times daily.     Marland Kitchen gemfibrozil (LOPID) 600 MG tablet Take 600 mg by mouth 2 (two) times daily.      Marland Kitchen levothyroxine (SYNTHROID) 100 MCG tablet Take 1 tablet (100 mcg total) by mouth daily before breakfast. 30 tablet 1  . metFORMIN (GLUCOPHAGE) 500 MG tablet Take 500 mg by mouth 2 (two) times daily.    . methocarbamol (ROBAXIN) 500 MG tablet Take 500 mg by mouth 2 (two) times daily.     Marland Kitchen oxybutynin (DITROPAN-XL) 10 MG 24 hr tablet Take 10 mg by mouth at bedtime.     Marland Kitchen oxyCODONE ER (XTAMPZA ER) 9 MG C12A Take 9 mg by mouth every 12 (twelve) hours.    . polyethylene glycol (MIRALAX) 17 g packet Take 17 g by mouth daily as needed for moderate constipation. 14 each 0  . vitamin B-12 (CYANOCOBALAMIN) 1000 MCG tablet Take 1,000 mcg by mouth daily.     No current facility-administered medications on file prior to visit.    Allergies:   Allergies  Allergen Reactions  . Codeine Nausea Only     Physical Exam  Vitals:   01/17/20 1450  BP: 134/60  Pulse: 72  Temp: (!) 97.4 F (36.3 C)  TempSrc: Oral  Weight: 159 lb 3.2 oz (72.2 kg)  Height: 5' (1.524 m)   Body mass index is 31.09 kg/m. No exam data present  Depression screen [facility] 2/9 01/17/2020  Decreased Interest 0  Down, Depressed, Hopeless 0  PHQ - 2 Score 0     General: well developed, well nourished, very pleasant elderly Caucasian female, seated, in no evident distress Head: head normocephalic and atraumatic.   Neck: supple with no carotid or supraclavicular bruits Cardiovascular: regular rate and rhythm, no murmurs Musculoskeletal: no deformity Skin:  no rash/petichiae Vascular:  Normal pulses all extremities   Neurologic Exam Mental Status: Awake and fully alert.   Mild to moderate expressive aphasia.  No evidence of receptive aphasia.  Oriented to place and  time. Recent and remote memory intact. Attention span, concentration and  fund of knowledge appropriate. Mood and affect appropriate.  Cranial Nerves: Fundoscopic exam reveals sharp disc margins. Pupils equal, briskly reactive to light. Extraocular movements full without nystagmus. Visual fields full to confrontation. Hearing intact. Facial sensation intact.  Mild right nasolabial fold flattening. Motor: Normal bulk and tone.  RUE: 4/5 RLE: 3/5 hip flexor and 4/5 knee extension, knee flexion and ankle dorsiflexion LUE: 5/5 LLE: 4/5 hip flexor (chronic 2/2 2x hip replacement) Sensory.: intact to touch , pinprick , position and vibratory sensation.  Coordination: Rapid alternating movements normal in all extremities except mildly decreased right hand. Finger-to-nose performed accurately bilaterally and heel-to-shin difficulty performing due to weakness. Gait and Station: Arises from chair with mild difficulty. Stance is hunched. Gait demonstrates  short cautious steps with use of Rollator walker Reflexes: 1+ and symmetric. Toes downgoing.     NIHSS  4 Modified Rankin  3    Diagnostic Data (Labs, Imaging, Testing)   Code Stroke age indeterminate L basal ganglia lacune new since 2015.   MRI  Acute L basal ganglia infarct. Small vessel disease.   MRA  Unremarkable   Carotid Doppler  R 40-59%, L 40-59%  CTA neck right ICA bulb 80% stenosis. Left VA origin 55% stenosis  2D Echo EF 60-65%  LDL 77  HgbA1c 5.5    ASSESSMENT: [name] is a 74 y.o. year old female presented with increasing fatigue, hypoglycemia and right-sided weakness on 12/13/2019 with stroke work-up revealing left basal ganglia infarct secondary to small vessel disease source. Vascular risk factors include recent COVID-19 infection, right carotid stenosis, HTN, HLD, DM with frequent hypoglycemic events, and tobacco use.  Evaluated by vascular surgery with plans on undergoing right carotid endarterectomy in the  near future.  Residual deficits of right hemiparesis and expressive aphasia    PLAN:  1. Left BG stroke: Continue aspirin 81 mg daily  and atorvastatin for secondary stroke prevention.  No indication to continue Plavix at this time from a neurological standpoint but advised to discuss further with Dr. Donnetta Hutching, vascular surgeon, if DAPT indicated to continue.  Maintain strict control of hypertension with blood pressure goal below 130/90, diabetes with hemoglobin A1c goal below 6.5% and cholesterol with LDL cholesterol (bad cholesterol) goal below 70 mg/dL.  I also advised the patient to eat a healthy diet with plenty of whole grains, cereals, fruits and vegetables, exercise regularly with at least 30 minutes of continuous activity daily and maintain ideal body weight. 2. HTN: Advised to continue current treatment regimen.  Today's BP stable.  Advised to continue to monitor at home along with continued follow-up with PCP for management 3. HLD: Advised to continue current treatment regimen along with continued follow-up with PCP for future prescribing and monitoring of lipid panel 4. DMII: Advised to continue to monitor glucose levels at home along with continued follow-up with PCP for management and monitoring 5. Right carotid stenosis: Plans on undergoing right carotid endarterectomy in the near future with Dr. Donnetta Hutching.  Discussion regarding importance of managing stroke risk factors, healthy diet and adequate exercise 6. Right hemiparesis and expressive aphasia, poststroke: Continuation of home health therapies and recommend adding speech therapy for ongoing aphasia.  Currently receiving home health therapy through [facility] and will place new referral to continue nursing, PT and adding speech therapy.    Follow up in 3 months or call earlier if needed   Greater than 50% of time during this 45 minute visit was spent on counseling, explanation of diagnosis of left BG  stroke, reviewing risk factor  management of HTN, HLD, DM and severe right carotid stenosis, discussion regarding right carotid endarterectomy, discussed residual deficits, planning of further management along with potential future management, and discussion with patient and family answering all questions.    Frann Rider, AGNP-BC  [facility] [address] [place], [place] [address]  Phone [phone] Fax [phone] Note: This document was prepared with digital dictation and possible smart phrase technology. Any transcriptional errors that result from this process are unintentional.

## 2020-01-19 ENCOUNTER — Other Ambulatory Visit: Payer: Self-pay | Admitting: *Deleted

## 2020-01-19 NOTE — Patient Outreach (Signed)
[facility]) Care Management  01/19/2020  [name] Jun 02, 1946 [id number]   Subjective: Per patient previous verbal consent.  Telephone call to patient's daughter home / mobile number, spoke with patient's daughter [name]), stated patient's name, date of birth, and address.  Daughter states she remembers speaking with this RNCM in the past, this is not a good time to talk, just got father home from the hospital, and requested call back at a later time.      Objective:Per KPN (Knowledge Performance Now, point of care tool) and chart review,patient hospitalized 12/12/2019 - 12/15/2019 for Acute CVA, altered mental status, right side weakness,Left basal ganglia stroke with residual right-sided hemiparesis. Patient hospitalized 11/29/2019 - 12/02/2019 for Acute UTI. Patient also has a history of diabetes on insulin, hypertension, hyperlipidemia, COPD, Crohn's disease,Hypothyroidism,EMPHYSEMA,Hypoglycemia, Covid positive 11/29/2019,Right carotid stenosis,pyelonephritis, cervical cancer, and Graves' disease.     Assessment: Received [name] EMMI Stroke Red Flag Alert follow up referral on 12/23/2019. Red Flag Alert Trigger, Day #6, patient answered yes to the following question: Feeling worse overall? EMMI follow up completed and will follow up to assess further care management needs.     Plan: RNCM will call patient or patient's daughter [name]) for 2nd telephone outreach attempt, within 4 business days, EMMI follow up, to assess for further CM needs, and proceed with case closure, within 10 business days if no return call.     [name], BSN, [place] [facility] Telephonic CM Phone: [phone] Fax: [phone]

## 2020-01-19 NOTE — Progress Notes (Signed)
Subjective: [name] presents today for follow up of preventative diabetic foot care and painful porokeratotic lesion(s) plantar aspect of left hallux and painful mycotic toenails b/l that limit ambulation. Aggravating factors include weightbearing with and without shoe gear. Pain for both is relieved with periodic professional debridement.   She states she and her husband both had strokes since their last visit here. She will be seen for work-up with Vascular for her right carotid artery.  Mrs. [name] states lesion on her left great toe is tender. She denies any redness, drainage or swelling of digit.   Allergies  Allergen Reactions  . Codeine Nausea Only     Objective: Vitals:   01/16/20 1518  Temp: (!) 97 F (36.1 C)    Vascular Examination:  Capillary fill time to digits <3s b/l. Palpable DP pulses b/l. Palpable PT pulses b/l. Pedal hair absent b/l Skin temperature gradient within normal limits b/l.  Dermatological Examination: Pedal skin with normal turgor, texture and tone bilaterally. No open wounds bilaterally. No interdigital macerations bilaterally. Toenails 1-5 b/l elongated, dystrophic, thickened, crumbly with subungual debris and tenderness to dorsal palpation. Porokeratotic lesion(s) plantar aspect left hallux IPJ. +Subdermal hemorrhage with tenderness to palpation. No erythema, no edema, no drainage, no flocculence.  Musculoskeletal: Normal muscle strength 5/5 to all lower extremity muscle groups bilaterally, no pain crepitus or joint limitation noted with ROM b/l and hammertoes noted to the  L 2nd toe and R 2nd toe  Neurological: Protective sensation absent with 10g monofilament b/l and vibratory sensation decreased b/l  Assessment: 1. Pain due to onychomycosis of toenails of both feet   2. Pre-ulcerative calluses   3. Diabetic peripheral neuropathy associated with type 2 diabetes mellitus ([place])    Plan: -Continue diabetic foot care principles. Literature  dispensed on today.  -Toenails 1-5 b/l were debrided in length and girth with sterile nail nippers and dremel without iatrogenic bleeding.  -Painful porokeratotic lesions left hallux pared and enucleated with sterile scalpel blade without incident. -Patient to continue soft, supportive shoe gear daily. -Patient to report any pedal injuries to medical professional immediately. -Patient/POA to call should there be question/concern in the interim.  Return in about 10 weeks (around 03/26/2020) for diabetic nail trim.

## 2020-01-24 ENCOUNTER — Other Ambulatory Visit: Payer: Self-pay | Admitting: *Deleted

## 2020-01-24 NOTE — Patient Outreach (Signed)
[facility]) Care Management  01/24/2020  [name] [birth year] [id number]   Subjective: Per patient previous verbal consent. Telephone call to patient's daughter mobile number, spoke with patient's daughter [name]), stated patient's name, date of birth, and address. Daughter remembers speaking with this RNCM in the past and is currently at work.   States patient is doing well, fatigues easily, working on building endurance, resting as needed, and home health services going well.   States patient is receiving home health physical therapy, speech therapy, RN, and occupational therapy was not needed.  Daughter states her brother has set up cameras in patient's home, so daughter is able to visually check on patient while she is at work, and this has been very helpful.   States patient's vascular surgeon follow up appointment went well, may do surgery in the future, and neurologist appointment went well.  States patient also has a bone spur on left elbow and has a follow up appointment with orthopedic MD on 01/25/2020. Daughter states patient  does not have any education material, EMMI follow up, care coordination, care management, disease monitoring, transportation, community resource, or pharmacy needs at this time. States she is very appreciative of the follow up, is in agreement  to receive 1 additional follow up call to assess for further CM needs on patient's behalf, and is in agreement to receive [place] Management EMMI follow up calls as needed.     Objective:Per KPN (Knowledge Performance Now, point of care tool) and chart review,patient hospitalized 12/12/2019 - 12/15/2019 for Acute CVA, altered mental status, right side weakness,Left basal ganglia stroke with residual right-sided hemiparesis. Patient hospitalized 11/29/2019 - 12/02/2019 for Acute UTI. Patient also has a history of diabetes on insulin, hypertension, hyperlipidemia, COPD, Crohn's  disease,Hypothyroidism,EMPHYSEMA,Hypoglycemia, Covid positive 11/29/2019,Right carotid stenosis,pyelonephritis, cervical cancer, and Graves' disease.     Assessment: Received [name] EMMI Stroke Red Flag Alert follow up referral on 12/23/2019. Red Flag Alert Trigger, Day #6, patient answered yes to the following question: Feeling worse overall?EMMI follow up completed and will follow up to assess further care management needs.     Plan:RNCM will call patientor patient's daughter [name])for telephone outreach attempt, within10 business days, EMMI follow up, to assess for further CM needs, and proceed with case closure, within 10 business days if no return call.     Katalina Magri H. Annia Friendly, BSN, [place] [facility] Telephonic CM Phone: [phone] Fax: [phone]

## 2020-02-01 ENCOUNTER — Other Ambulatory Visit: Payer: Self-pay | Admitting: *Deleted

## 2020-02-01 NOTE — Patient Outreach (Addendum)
[name]) Care Management  02/01/2020  [name] 1946-03-01 [id number]   Subjective:  Per patient previous verbal consent to speak with daughter [name]) regarding patient's healthcare needs as needed.Telephone call from patient'sdaughter [name])mobile number, spoke with daughter, stated patient's name, date of birth, and address.   States she is returning call, patient is doing very well, and appointment with orthopedic MD on 01/25/2020, went well.  Daughter states patient does not have any education material, EMMI follow up, care coordination, care management, disease monitoring, transportation, community resource, or pharmacy needs at this time.  States she is aware EMMI Stroke 30 days program follow up is completed and states she is very appreciative of the follow up.     Objective:Per KPN (Knowledge Performance Now, point of care tool) and chart review,patient hospitalized 12/12/2019 - 12/15/2019 for Acute CVA, altered mental status, right side weakness,Left basal ganglia stroke with residual right-sided hemiparesis. Patient hospitalized 11/29/2019 - 12/02/2019 for Acute UTI. Patient also has a history of diabetes on insulin, hypertension, hyperlipidemia, COPD, Crohn's disease,Hypothyroidism,EMPHYSEMA,Hypoglycemia, Covid positive 11/29/2019,Right carotid stenosis,pyelonephritis, cervical cancer, and Graves' disease.     Assessment: Received [name] EMMI Stroke Red Flag Alert follow up referral on 12/23/2019. Red Flag Alert Trigger, Day #6, patient answered yes to the following question: Feeling worse overall?EMMI follow up completed and no further care management needs.     Plan:RNCM will complete case closure due to follow up completed / no care management needs.       [name], BSN, [place] [facility] Telephonic CM Phone: [phone] Fax: [phone]

## 2020-02-01 NOTE — Patient Outreach (Addendum)
[facility]) Care Management  02/01/2020  [name] 06/17/1946 [id number]   Subjective: Per patient previous verbal consent to speak with daughter [name]) regarding patient's healthcare needs as needed. Telephone call to patient's daughter [name]) mobile number , no answer, left HIPAA compliant voicemail message, and requested call back.   Objective:Per KPN (Knowledge Performance Now, point of care tool) and chart review,patient hospitalized 12/12/2019 - 12/15/2019 for Acute CVA, altered mental status, right side weakness,Left basal ganglia stroke with residual right-sided hemiparesis. Patient hospitalized 11/29/2019 - 12/02/2019 for Acute UTI. Patient also has a history of diabetes on insulin, hypertension, hyperlipidemia, COPD, Crohn's disease,Hypothyroidism,EMPHYSEMA,Hypoglycemia, Covid positive 11/29/2019,Right carotid stenosis,pyelonephritis, cervical cancer, and Graves' disease.     Assessment: Received [name] EMMI Stroke Red Flag Alert follow up referral on 12/23/2019. Red Flag Alert Trigger, Day #6, patient answered yes to the following question: Feeling worse overall?EMMI follow up completed and will follow up to assess further care management needs.     Plan:RNCM will call patientor patient's daughter [name])for2nd telephone outreach attempt, within7 business days, EMMI follow up, to assess for further CM needs, and proceed with case closure, within 10 business days if no return call.     [name] H. Annia Friendly, BSN, [place] [facility] Telephonic CM Phone: [phone] Fax: [phone]

## 2020-02-10 ENCOUNTER — Encounter (HOSPITAL_COMMUNITY): Payer: Self-pay | Admitting: Emergency Medicine

## 2020-02-10 ENCOUNTER — Emergency Department (HOSPITAL_COMMUNITY): Payer: Medicare HMO

## 2020-02-10 ENCOUNTER — Observation Stay (HOSPITAL_COMMUNITY)
Admission: EM | Admit: 2020-02-10 | Discharge: 2020-02-11 | Disposition: A | Discharge status: Home / Self Care | Payer: Medicare HMO | Attending: Family Medicine | Admitting: Family Medicine

## 2020-02-10 ENCOUNTER — Other Ambulatory Visit: Payer: Self-pay

## 2020-02-10 DIAGNOSIS — Z87440 Personal history of urinary (tract) infections: Secondary | ICD-10-CM | POA: Diagnosis not present

## 2020-02-10 DIAGNOSIS — M79601 Pain in right arm: Secondary | ICD-10-CM

## 2020-02-10 DIAGNOSIS — G8190 Hemiplegia, unspecified affecting unspecified side: Secondary | ICD-10-CM | POA: Diagnosis not present

## 2020-02-10 DIAGNOSIS — G9009 Other idiopathic peripheral autonomic neuropathy: Secondary | ICD-10-CM | POA: Insufficient documentation

## 2020-02-10 DIAGNOSIS — Z87891 Personal history of nicotine dependence: Secondary | ICD-10-CM | POA: Diagnosis not present

## 2020-02-10 DIAGNOSIS — E039 Hypothyroidism, unspecified: Secondary | ICD-10-CM | POA: Diagnosis not present

## 2020-02-10 DIAGNOSIS — E785 Hyperlipidemia, unspecified: Secondary | ICD-10-CM | POA: Diagnosis not present

## 2020-02-10 DIAGNOSIS — E119 Type 2 diabetes mellitus without complications: Secondary | ICD-10-CM | POA: Diagnosis not present

## 2020-02-10 DIAGNOSIS — R519 Headache, unspecified: Secondary | ICD-10-CM | POA: Diagnosis not present

## 2020-02-10 DIAGNOSIS — Z20822 Contact with and (suspected) exposure to covid-19: Secondary | ICD-10-CM | POA: Insufficient documentation

## 2020-02-10 DIAGNOSIS — Z7982 Long term (current) use of aspirin: Secondary | ICD-10-CM | POA: Diagnosis not present

## 2020-02-10 DIAGNOSIS — Z79899 Other long term (current) drug therapy: Secondary | ICD-10-CM | POA: Diagnosis not present

## 2020-02-10 DIAGNOSIS — R569 Unspecified convulsions: Secondary | ICD-10-CM

## 2020-02-10 DIAGNOSIS — E1169 Type 2 diabetes mellitus with other specified complication: Secondary | ICD-10-CM | POA: Diagnosis present

## 2020-02-10 DIAGNOSIS — I10 Essential (primary) hypertension: Secondary | ICD-10-CM | POA: Diagnosis not present

## 2020-02-10 DIAGNOSIS — J438 Other emphysema: Secondary | ICD-10-CM | POA: Diagnosis present

## 2020-02-10 DIAGNOSIS — Z794 Long term (current) use of insulin: Secondary | ICD-10-CM | POA: Insufficient documentation

## 2020-02-10 DIAGNOSIS — N3281 Overactive bladder: Secondary | ICD-10-CM | POA: Insufficient documentation

## 2020-02-10 DIAGNOSIS — Z8673 Personal history of transient ischemic attack (TIA), and cerebral infarction without residual deficits: Secondary | ICD-10-CM

## 2020-02-10 HISTORY — DX: Other cerebral infarction due to occlusion or stenosis of small artery: I63.81

## 2020-02-10 LAB — CBC WITH DIFFERENTIAL/PLATELET
Abs Immature Granulocytes: 0.02 10*3/uL (ref 0.00–0.07)
Basophils Absolute: 0 10*3/uL (ref 0.0–0.1)
Basophils Relative: 1 %
Eosinophils Absolute: 0.1 10*3/uL (ref 0.0–0.5)
Eosinophils Relative: 2 %
HCT: 38 % (ref 36.0–46.0)
Hemoglobin: 11.4 g/dL — ABNORMAL LOW (ref 12.0–15.0)
Immature Granulocytes: 0 %
Lymphocytes Relative: 25 %
Lymphs Abs: 1.5 10*3/uL (ref 0.7–4.0)
MCH: 26.8 pg (ref 26.0–34.0)
MCHC: 30 g/dL (ref 30.0–36.0)
MCV: 89.4 fL (ref 80.0–100.0)
Monocytes Absolute: 0.5 10*3/uL (ref 0.1–1.0)
Monocytes Relative: 9 %
Neutro Abs: 3.8 10*3/uL (ref 1.7–7.7)
Neutrophils Relative %: 63 %
Platelets: 224 10*3/uL (ref 150–400)
RBC: 4.25 MIL/uL (ref 3.87–5.11)
RDW: 14.4 % (ref 11.5–15.5)
WBC: 6 10*3/uL (ref 4.0–10.5)
nRBC: 0 % (ref 0.0–0.2)

## 2020-02-10 LAB — BASIC METABOLIC PANEL
Anion gap: 13 (ref 5–15)
BUN: 11 mg/dL (ref 8–23)
CO2: 21 mmol/L — ABNORMAL LOW (ref 22–32)
Calcium: 8.9 mg/dL (ref 8.9–10.3)
Chloride: 107 mmol/L (ref 98–111)
Creatinine, Ser: 0.92 mg/dL (ref 0.44–1.00)
GFR calc Af Amer: >60 mL/min (ref >60)
GFR calc non Af Amer: >60 mL/min (ref >60)
Glucose, Bld: 205 mg/dL — ABNORMAL HIGH (ref 70–99)
Potassium: 4.7 mmol/L (ref 3.5–5.1)
Sodium: 141 mmol/L (ref 135–145)

## 2020-02-10 NOTE — ED Provider Notes (Signed)
[facility] EMERGENCY DEPARTMENT Provider Note   CSN: [id number] Arrival date & time: 02/10/20  1758     History Chief Complaint  Patient presents with  . Arm Pain    [name] is a 74 y.o. female.  74yo female brought in by EMS from home with complaint of right arm pain without injury.  Per EMS report to nurse, family was concerned because patient complained of arm pain last time she had a stroke.  Patient reports having 2 strokes previously notes right side deficits, walks with a walker.  Patient denies chest pain, abdominal pain, shortness of breath.  Pain in her right arm is worse with movement or palpation of the arm.  No other complaints or concerns today.        Past Medical History:  Diagnosis Date  . Arthritis   . Bilateral lower extremity edema   . Flank pain   . Full dentures   . History of cervical cancer    11/ 1994  Stage IB  s/p  high dose radiation brachytherapy @ [facility] 01/ 1995--- per pt no recurrence  . History of chronic bronchitis   . History of hyperthyroidism    d 03/ 2011---due to grave's disease--- s/p  RAI treatment 04/ 2011  . History of sepsis 05/03/2018   due to UTI with klebsiella/ pyelonephritis/ ureteral obstruction cause by stone  . Hyperlipidemia   . Hypothyroidism, postradioiodine therapy    endocrinologist--  dr Loanne Drilling--  dx graves disease and s/p RAI i131 treatement 4/ 2011  . Nauseated   . Oral thrush 05/03/2018  . Pneumonia   . Type 2 diabetes mellitus treated with insulin ([place])    FOLLOWED BY PCP  . Urgency of urination   . Wears glasses     Patient Active Problem List   Diagnosis Date Noted  . Altered mental status 12/12/2019  . Hypoglycemia 12/12/2019  . Acute UTI 11/29/2019  . Normocytic anemia 11/29/2019  . Nausea, vomiting, and diarrhea 07/27/2019  . Hypokalemia 07/27/2019  . Hypomagnesemia 07/27/2019  . History of cervical cancer 03/18/2019  . Sepsis ([place]) 05/04/2018  . Acute lower UTI  05/04/2018  . Uncontrolled type 2 diabetes mellitus with hyperglycemia ([place]) 05/04/2018  . ARF (acute renal failure) ([place]) 05/04/2018  . Essential hypertension 05/04/2018  . Bilateral lower extremity edema 07/31/2017  . MUSCLE CRAMPS 09/19/2010  . NUMBNESS 09/19/2010  . Hypothyroidism 06/06/2010  . THRUSH 11/29/2008  . Type 2 diabetes mellitus ([place]) 10/20/2008  . Hyperlipidemia associated with type 2 diabetes mellitus ([place]) 10/20/2008  . BRONCHITIS, ACUTE WITH BRONCHOSPASM 10/20/2008  . EMPHYSEMA 10/20/2008  . C O P D 10/20/2008  . CROHN'S DISEASE 10/20/2008  . TOBACCO ABUSE-HISTORY OF 10/20/2008    Past Surgical History:  Procedure Laterality Date  . APPENDECTOMY    . CATARACT EXTRACTION W/ INTRAOCULAR LENS  IMPLANT, BILATERAL  2004  approx.  . CYSTOSCOPY WITH STENT PLACEMENT Right 05/04/2018   Procedure: CYSTOSCOPY WITH STENT PLACEMENT AND RETROGRADE PYELOGRAM;  Surgeon: Ceasar Mons, MD;  Location: [facility];  Service: Urology;  Laterality: Right;  . CYSTOSCOPY/URETEROSCOPY/HOLMIUM LASER/STENT PLACEMENT Right 05/19/2018   Procedure: CYSTOSCOPY, URETEROSCOPY/HOLMIUM LASER, STONE BASKETRY/ STENT EXCHANGE;  Surgeon: Ceasar Mons, MD;  Location: [facility];  Service: Urology;  Laterality: Right;  . EXCISION MASS LEFT CHEST WALL  09-20-2007   dr Grandville Silos  [facility]  . Radioactive Iodine Therapy     for thyroid  . REVISION TOTAL HIP ARTHROPLASTY Left early  2000s  . TANDEM RING INSERTION  1995   dr Aldean Ast @ [facility]   EUA w/ tandem placement in ovid  (for direct high dose radiation brachytherapy , cervical cancer)  . TOTAL HIP ARTHROPLASTY Left 1990s  . TRANSTHORACIC ECHOCARDIOGRAM  08/07/2017   mild focal basal hypertrophy of the septum,  ef 16-38%, grade 1 diastolic dysfunction/  atrial septum with lipomatous hypertrophy/  trivial PR     OB History   No obstetric history on file.     Family History  Problem Relation Age of Onset  . Emphysema  Father   . Diabetes Father   . Emphysema Sister   . Emphysema Brother   . Cancer Mother        Skin Cancer  . Diabetes Mother     Social History   Tobacco Use  . Smoking status: Former Smoker    Years: 5.00    Types: Cigarettes    Quit date: 05/13/1982    Years since quitting: 37.7  . Smokeless tobacco: Never Used  Substance Use Topics  . Alcohol use: No  . Drug use: No    Home Medications Prior to Admission medications   Medication Sig Start Date End Date Taking? Authorizing Provider  acetaminophen (TYLENOL) 500 MG tablet Take 500 mg by mouth as needed (leg pain).     [provider]  aspirin EC 81 MG EC tablet Take 1 tablet (81 mg total) by mouth daily. 12/16/19   Hosie Poisson, MD  atorvastatin (LIPITOR) 40 MG tablet Take 1 tablet (40 mg total) by mouth daily at 6 PM. 12/15/19   Hosie Poisson, MD  baclofen (LIORESAL) 10 MG tablet Take 10 mg by mouth 4 (four) times daily as needed (leg cramps).     [provider]  clopidogrel (PLAVIX) 75 MG tablet Take 1 tablet (75 mg total) by mouth daily. 12/16/19   Hosie Poisson, MD  ergocalciferol (VITAMIN D2) 50000 UNITS capsule Take 50,000 Units by mouth every Sunday.     [provider]  gabapentin (NEURONTIN) 300 MG capsule Take 900 mg by mouth 2 (two) times daily.     [provider]  gemfibrozil (LOPID) 600 MG tablet Take 600 mg by mouth 2 (two) times daily.      [provider]  levothyroxine (SYNTHROID) 100 MCG tablet Take 1 tablet (100 mcg total) by mouth daily before breakfast. 12/16/19   Hosie Poisson, MD  metFORMIN (GLUCOPHAGE) 500 MG tablet Take 500 mg by mouth 2 (two) times daily. 01/06/20   [provider]  methocarbamol (ROBAXIN) 500 MG tablet Take 500 mg by mouth 2 (two) times daily.  04/22/19   [provider]  oxybutynin (DITROPAN-XL) 10 MG 24 hr tablet Take 10 mg by mouth at bedtime.  07/26/19   [provider]  oxyCODONE ER (XTAMPZA ER) 9 MG C12A Take 9 mg  by mouth every 12 (twelve) hours.    [provider]  polyethylene glycol (MIRALAX) 17 g packet Take 17 g by mouth daily as needed for moderate constipation. 07/27/19   Donne Hazel, MD  vitamin B-12 (CYANOCOBALAMIN) 1000 MCG tablet Take 1,000 mcg by mouth daily.    [provider]    Allergies    Codeine  Review of Systems   Review of Systems  Constitutional: Negative for fever.  Respiratory: Negative for shortness of breath.   Cardiovascular: Negative for chest pain.  Gastrointestinal: Negative for abdominal pain, nausea and vomiting.  Musculoskeletal: Positive for arthralgias and  myalgias. Negative for neck pain and neck stiffness.  Skin: Negative for rash and wound.  Neurological: Negative for weakness and numbness.  All other systems reviewed and are negative.   Physical Exam Updated Vital Signs BP (!) 185/58   Pulse 67   Temp 98 F (36.7 C) (Oral)   Resp (!) 26   SpO2 98%   Physical Exam Vitals and nursing note reviewed.  Constitutional:      General: She is not in acute distress.    Appearance: She is well-developed. She is not diaphoretic.  HENT:     Head: Normocephalic and atraumatic.     Mouth/Throat:     Mouth: Mucous membranes are moist.  Eyes:     Extraocular Movements: Extraocular movements intact.     Pupils: Pupils are equal, round, and reactive to light.  Cardiovascular:     Rate and Rhythm: Normal rate and regular rhythm.     Pulses: Normal pulses.     Heart sounds: Normal heart sounds.  Pulmonary:     Effort: Pulmonary effort is normal.     Breath sounds: Wheezing present.  Abdominal:     Palpations: Abdomen is soft.     Tenderness: There is no abdominal tenderness.  Musculoskeletal:        General: Tenderness present. No swelling, deformity or signs of injury.       Arms:     Right lower leg: No edema.     Left lower leg: No edema.     Comments: Non specific tenderness to right upper extremity, worse with movement and  palpation right arm, compartments soft, sensation intact, radial pulses normal.   Skin:    General: Skin is warm and dry.     Findings: No erythema or rash.  Neurological:     Mental Status: She is alert and oriented to person, place, and time.  Psychiatric:        Behavior: Behavior normal.     ED Results / Procedures / Treatments   Labs (all labs ordered are listed, but only abnormal results are displayed) Labs Reviewed  BASIC METABOLIC PANEL - Abnormal; Notable for the following components:      Result Value   CO2 21 (*)    Glucose, Bld 205 (*)    All other components within normal limits  CBC WITH DIFFERENTIAL/PLATELET - Abnormal; Notable for the following components:   Hemoglobin 11.4 (*)    All other components within normal limits  URINALYSIS, ROUTINE W REFLEX MICROSCOPIC    EKG None  Radiology DG Forearm Right  Result Date: 02/10/2020 CLINICAL DATA:  Right arm pain. EXAM: RIGHT FOREARM - 2 VIEW COMPARISON:  None. FINDINGS: Cortical margins of the radius and ulna are intact. There is no evidence of fracture or other focal bone lesions. Small olecranon spur. Mild elbow joint degenerative change. Soft tissues are unremarkable. IMPRESSION: No acute abnormality of the right forearm. Mild degenerative change of the elbow. Electronically Signed   By: Keith Rake M.D.   On: 02/10/2020 19:40   DG Chest Port 1 View  Result Date: 02/10/2020 CLINICAL DATA:  Pain. EXAM: PORTABLE CHEST 1 VIEW COMPARISON:  12/12/2019 FINDINGS: The cardiomediastinal contours are normal. Atherosclerosis of the aortic arch. Chronic interstitial coarsening, unchanged. Pulmonary vasculature is normal. No consolidation, pleural effusion, or pneumothorax. No acute osseous abnormalities are seen. IMPRESSION: No acute chest findings. Aortic Atherosclerosis (ICD10-I70.0). Electronically Signed   By: Keith Rake M.D.   On: 02/10/2020 19:33   DG Humerus  Right  Result Date: 02/10/2020 CLINICAL DATA:   Right arm pain. EXAM: RIGHT HUMERUS - 2+ VIEW COMPARISON:  None. FINDINGS: Cortical margins of the right humerus are intact. There is no evidence of fracture or other focal bone lesions. Shoulder and elbow alignment are maintained. There is degenerative change of the glenohumeral and elbow joint. Small olecranon spur. Soft tissues are unremarkable. IMPRESSION: No acute bony abnormality of the right humerus. Degenerative change of the shoulder and elbow. Electronically Signed   By: Keith Rake M.D.   On: 02/10/2020 19:39    Procedures Procedures (including critical care time)  Medications Ordered in ED Medications - No data to display  ED Course  I have reviewed the triage vital signs and the nursing notes.  Pertinent labs & imaging results that were available during my care of the patient were reviewed by me and considered in my medical decision making (see chart for details).  Clinical Course as of Feb 09 2025  Fri Feb 10, 2020  2022 73yo female brought in by EMS from home for right arm pain onset 5PM. Family reportedly concerned due to last CVA presented as arm pain. Attempted to reach daughter- [name] by phone, unsuccessful.  On exam, TTP right arm, also pain with movement. Skin intact, compartments soft, normal pulses.  Labs including BMP and CBC unremarkable. CXR done due to audible wheezing- no acute findings, patient denies feeling SHOB. XR right upper extremity unremarkable. MRI brain pending. Case discussed with Dr. Kathrynn Humble, ER attending, will assume care of patient awaiting results.    [LM]    Clinical Course User Index [LM] Roque Lias   MDM Rules/Calculators/A&P                      Final Clinical Impression(s) / ED Diagnoses Final diagnoses:  Right arm pain    Rx / DC Orders ED Discharge Orders    None       Roque Lias 02/10/20 2026    Varney Biles, MD 02/10/20 2219

## 2020-02-10 NOTE — ED Triage Notes (Signed)
Pt bib gems from home for L arm pain starting at 1700 today. Pt's family reported pt has hx of stroke that presented with arm pain so called ems. Pt a/o x2 which is baseline.  Pt reports her L arm is hurting but retracts from any touch to the R arm

## 2020-02-10 NOTE — H&P (Addendum)
[facility] Admission History and Physical Service Pager: [phone]  Patient name: [name] Medical record number: [id number] Date of birth: 03-18-1946 Age: 74 y.o. Gender: female  Primary Care Provider: Everardo Beals, NP Consultants: Neurology  Code Status: DNR  Preferred Emergency Contact: Daughter [phone]   Chief Complaint:  Right sided weakness   Assessment and Plan: [name] is a 74 y.o. female presenting with right-sided weakness and numbness. PMH is significant for CVA, diabetes, HLD, hypothyrodism, peripheral neuropathy and overactive bladder.   Concern for seizure following CVA [name] is a 74 year old female who presents today with right-sided UE tightness followed by weakness, pain and numbness. Symptoms lasted for 4 hours before patient returned to baseline. She had these symptoms a couple times this week, once incident on Thursday only lasted an hour. Patient has a had CVA in Jan 2021 involved left basal ganglia with residual right-sided hemiparesis. Carotid duplex at this time showed 40 to 50% stenosis on the right side and 40 to 59% stenosis on the left. CTA of the neck showed 80% stenosis of right ICA bulb . In the ED: MRI head showed expected evolution of the January lacunar infarct affecting the left corona radiata/basal ganglia. No associated hemorrhage or mass effect and no new intracranial abnormality. Labs: wnl except glucose and Hb 11.4. CXR: no acute findings. Reviewed by Dr Aroor whose recommendations were that this episode was more likely to be a focal seizure than a TIA given patient has been having headaches, tensing of the right hand on two occasions this week and these symptoms are on the same side of the stroke. EEG pending for the morning and further neuro evaluation.  Admit to [facility], Attending Dr. Dorris Singh -Continuous telemetry and pulse oximetry -Neuro consulted, appreciate  recs -Continue home ASA 81, Plavix 75; additional Lovenox for DVT prophylaxis -Vitals per floor routine -Up with assistance -Bedside swallow, carb modified diet if passes swallow  -EEG am -Carotid dopplers -Seizure precautions -Delirium precautions  -Fall precautions  -PT/OT  UTI Known to have recurrent UTIs. Per patient's PCP is now on Keflex 252m daily started Jan 31, 2020, did Keflex 5043mBID x 7 days. -Continue Keflex   Headaches Reports on going right temporal headaches since Jan 2021, possible visual disturbances but patient is a poor historian. Denies jaw claudication, but does have temporal tenderness to palpation. No known polymyalgia rheumatica.  -CRP and ESR pending to rule out temporal arteritis  -Tylenol 50024m6H  History of CVA MRI head: expected evolution of the January lacunar infarct affecting the left corona radiata/basal ganglia. No associated hemorrhage or mass effect and no new intracranial abnormality. Home meds: Aspirin 81 mg, Plavix 75 mg -Continue Aspirin and Plavix   Diabetes Glucose on BMP on admission 205, last A1c 5.5% Jan 2021 A1c was 9.4% 1 year ago  Home meds: Metformin 500 mg BID, Tresiba 40 units  -Hold Tresiba and Metformin -Lantus 10 units  -Sensitive sliding scale insulin -CBGs TID and QHS   Hyperlipidemia Lipid panel: wnl, except HDL was low at 24 (Jan 2021). Home meds: Atorvastatin 40 mg, gemfibrozil 600 mg BID  -Continue Atorvastatin and Gemfibrozil -Patient would benefit from increasing Atorvastatin up to 69m42mily    Hypothyroidism Last TSH slightly decreased at 0.131 in Jan 2021, however was measured in hospital when patient was acutely ill. Patient asymptomatic except for diarrhea/loose stools. Home meds: levothyroxine 100 mcg -Continue levothyroxine -Re-evaluate TSH outpatient  Muscle  spasms  Peripheral Neuropathy  Chronic Pain Patient with history of chronic pains, bilateral hip replacements. Home meds: Robaxin 500 mg  BID, gabapentin 99m BID and XR Oxycodone 939mdaily  -Continue Robaxin and Oxycodone -Gabapentin 30041mID due to patient being slightly altered  Overactive bladder Home meds: Oxybutynin 10 mg at bedtime -Continue oxybutynin - PurWick in Place  Nutrition Home meds: Vit b12 1000 mcg , vitamin D 50,000 units every Sunday  FEN/GI: Carb modified diet pending Speech Eval/Swallow study Prophylaxis: Lovenox   Disposition: IP for next 1-2 days   History of Present Illness:  [name] a 73 42o. female presenting with sudden onset right arm weakness and speech disturbance. Pt is poor historian. History was from daughter who was present in the ER.  Patient had sudden onset right arm weakness, right arm pain and speech disturbance 4:55pm. Was unable to lift up right arm and was crying to her husband.  She also had "tensing" of the right arm an hand today. No twitching or cramps in the arm.  Her symptoms lasted until 9 PM today, her daughter said she started to return to baseline around this time. 3 days ago she had a similar episode where her right fist was tensed and family member thought this was a TIA.   Denies chest pain, shortness of breath, dizziness, palpitations, abdominal pain or fevers. Patient has on going UTI and just finished course of Keflex. Denies urinary symptoms. Has on going blood in stools which is bright red. Last had colonoscopy in 2019.   Review Of Systems: Per HPI   Patient Active Problem List   Diagnosis Date Noted  . Seizure ([place]CC[place]3/27/2021  . Altered mental status 12/12/2019  . Hypoglycemia 12/12/2019  . Acute UTI 11/29/2019  . Normocytic anemia 11/29/2019  . Nausea, vomiting, and diarrhea 07/27/2019  . Hypokalemia 07/27/2019  . Hypomagnesemia 07/27/2019  . History of cervical cancer 03/18/2019  . Sepsis ([place]CC[place]6/18/2019  . Acute lower UTI 05/04/2018  . Uncontrolled type 2 diabetes mellitus with hyperglycemia (HCC[place]6/18/2019  . ARF (acute renal  failure) (HCC[place]6/18/2019  . Essential hypertension 05/04/2018  . Bilateral lower extremity edema 07/31/2017  . MUSCLE CRAMPS 09/19/2010  . NUMBNESS 09/19/2010  . Hypothyroidism 06/06/2010  . THRUSH 11/29/2008  . Type 2 diabetes mellitus ([place]CC[place]2/02/2008  . Hyperlipidemia associated with type 2 diabetes mellitus (HCC[place]2/02/2008  . BRONCHITIS, ACUTE WITH BRONCHOSPASM 10/20/2008  . EMPHYSEMA 10/20/2008  . C O P D 10/20/2008  . CROHN'S DISEASE 10/20/2008  . TOBACCO ABUSE-HISTORY OF 10/20/2008    Past Medical History: Past Medical History:  Diagnosis Date  . Arthritis   . Bilateral lower extremity edema   . Flank pain   . Full dentures   . History of cervical cancer    11/ 1994  Stage IB  s/p  high dose radiation brachytherapy @ [facility] 01/ 1995--- per pt no recurrence  . History of chronic bronchitis   . History of hyperthyroidism    d 03/ 2011---due to grave's disease--- s/p  RAI treatment 04/ 2011  . History of sepsis 05/03/2018   due to UTI with klebsiella/ pyelonephritis/ ureteral obstruction cause by stone  . Hyperlipidemia   . Hypothyroidism, postradioiodine therapy    endocrinologist--  dr ellLoanne Drilling dx graves disease and s/p RAI i131 treatement 4/ 2011  . Nauseated   . Oral thrush 05/03/2018  . Pneumonia   . Type 2 diabetes mellitus treated with insulin ([place]CC[place]  FOLLOWED BY PCP  . Urgency of urination   . Wears glasses     Past Surgical History: Past Surgical History:  Procedure Laterality Date  . APPENDECTOMY    . CATARACT EXTRACTION W/ INTRAOCULAR LENS  IMPLANT, BILATERAL  2004  approx.  . CYSTOSCOPY WITH STENT PLACEMENT Right 05/04/2018   Procedure: CYSTOSCOPY WITH STENT PLACEMENT AND RETROGRADE PYELOGRAM;  Surgeon: Ceasar Mons, MD;  Location: [facility];  Service: Urology;  Laterality: Right;  . CYSTOSCOPY/URETEROSCOPY/HOLMIUM LASER/STENT PLACEMENT Right 05/19/2018   Procedure: CYSTOSCOPY, URETEROSCOPY/HOLMIUM LASER, STONE BASKETRY/ STENT EXCHANGE;   Surgeon: Ceasar Mons, MD;  Location: [facility];  Service: Urology;  Laterality: Right;  . EXCISION MASS LEFT CHEST WALL  09-20-2007   dr Grandville Silos  [facility]  . Radioactive Iodine Therapy     for thyroid  . REVISION TOTAL HIP ARTHROPLASTY Left early 2000s  . TANDEM RING INSERTION  1995   dr Aldean Ast @ [facility]   EUA w/ tandem placement in ovid  (for direct high dose radiation brachytherapy , cervical cancer)  . TOTAL HIP ARTHROPLASTY Left 1990s  . TRANSTHORACIC ECHOCARDIOGRAM  08/07/2017   mild focal basal hypertrophy of the septum,  ef 53-97%, grade 1 diastolic dysfunction/  atrial septum with lipomatous hypertrophy/  trivial PR    Social History: Social History   Tobacco Use  . Smoking status: Former Smoker    Years: 5.00    Types: Cigarettes    Quit date: 05/13/1982    Years since quitting: 37.7  . Smokeless tobacco: Never Used  Substance Use Topics  . Alcohol use: No  . Drug use: No    Additional social history:  Please also refer to relevant sections of EMR.  Family History: Family History  Problem Relation Age of Onset  . Emphysema Father   . Diabetes Father   . Emphysema Sister   . Emphysema Brother   . Cancer Mother        Skin Cancer  . Diabetes Mother    (If not completed, MUST add something in)  Allergies and Medications: Allergies  Allergen Reactions  . Codeine Nausea Only   No current facility-administered medications on file prior to encounter.   Current Outpatient Medications on File Prior to Encounter  Medication Sig Dispense Refill  . acetaminophen (TYLENOL) 500 MG tablet Take 500 mg by mouth as needed (leg pain).     Marland Kitchen aspirin EC 81 MG EC tablet Take 1 tablet (81 mg total) by mouth daily. 30 tablet 1  . atorvastatin (LIPITOR) 40 MG tablet Take 1 tablet (40 mg total) by mouth daily at 6 PM. 30 tablet 1  . cephALEXin (KEFLEX) 250 MG capsule Take 250 mg by mouth daily. For 7 days    . clopidogrel (PLAVIX) 75 MG tablet Take  1 tablet (75 mg total) by mouth daily. 21 tablet 0  . ergocalciferol (VITAMIN D2) 50000 UNITS capsule Take 50,000 Units by mouth every Sunday.     . gabapentin (NEURONTIN) 300 MG capsule Take 900 mg by mouth 3 (three) times daily.     Marland Kitchen gemfibrozil (LOPID) 600 MG tablet Take 600 mg by mouth 2 (two) times daily.      . insulin degludec (TRESIBA FLEXTOUCH) 100 UNIT/ML FlexTouch Pen Inject 40 Units into the skin daily as needed (Only if sugar is above 200).    Marland Kitchen levothyroxine (SYNTHROID) 100 MCG tablet Take 1 tablet (100 mcg total) by mouth daily before breakfast. 30 tablet 1  . metFORMIN (GLUCOPHAGE)  500 MG tablet Take 500 mg by mouth 2 (two) times daily.    . methocarbamol (ROBAXIN) 500 MG tablet Take 500 mg by mouth 2 (two) times daily.     Marland Kitchen oxybutynin (DITROPAN-XL) 10 MG 24 hr tablet Take 10 mg by mouth at bedtime.     Marland Kitchen oxyCODONE ER (XTAMPZA ER) 9 MG C12A Take 9 mg by mouth every 12 (twelve) hours.    . polyethylene glycol (MIRALAX) 17 g packet Take 17 g by mouth daily as needed for moderate constipation. 14 each 0  . vitamin B-12 (CYANOCOBALAMIN) 1000 MCG tablet Take 1,000 mcg by mouth daily.      Objective: BP (!) 160/63   Pulse 68   Temp 98 F (36.7 C) (Oral)   Resp 14   SpO2 97%   Exam:  General: Well appearing 74 yr old female, no acute distress Eyes: No scleral icterus, normal EOM  ENTM: No pharyngeal, no exudates, no thyromegaly or lymphadenopathy Neck: supple, normal ROM  Cardiovascular: S1 and S2, RRR Respiratory: CTAB, normal WOB Gastrointestinal: Abdo soft, non tender, bowel sounds MSK:  No gross deformities, no peripheral edema  Derm: Warm and dry  Neuro: Hyperreflexia RUE, decreased sensation RUE, 4/5 strength in RUE Psych: Normal mood, normal affect  Labs and Imaging: CBC BMET  Recent Labs  Lab 02/10/20 1900  WBC 6.0  HGB 11.4*  HCT 38.0  PLT 224   Recent Labs  Lab 02/10/20 1900  NA 141  K 4.7  CL 107  CO2 21*  BUN 11  CREATININE 0.92  GLUCOSE  205*  CALCIUM 8.9     EKG: SR  Lattie Haw, MD 02/11/2020, 1:00 AM PGY-1, [place] Intern pager: [phone], text pages welcome   FPTS Upper-Level Resident Addendum   I have independently interviewed and examined the patient. I have discussed the above with the original author and agree with their documentation. Please see also any attending notes.    Milus Banister, DO PGY-2, [facility] 02/11/2020 2:17 AM  FPTS Service pager: [phone] (text pages welcome through [facility])

## 2020-02-10 NOTE — Discharge Instructions (Addendum)
You were admitted to the hospital for work-up of arm tightening.  You were seen by neurology.  It does not appear that you are having obvious seizures at this time, but this cannot be completely ruled out.  If you have another episode of this, it is important that you come to the emergency room right away for further evaluation.  It is important that you work with your primary care provider to decrease your sedating medications.  We have discontinued your robaxin as this can be dangerous to take with your other medications and given your age.  Please call your primary care provider first thing Monday morning to make an appointment for follow-up on Monday or Tuesday.

## 2020-02-11 ENCOUNTER — Observation Stay (HOSPITAL_COMMUNITY): Payer: Medicare HMO

## 2020-02-11 ENCOUNTER — Encounter (HOSPITAL_COMMUNITY): Payer: Self-pay | Admitting: Family Medicine

## 2020-02-11 DIAGNOSIS — E039 Hypothyroidism, unspecified: Secondary | ICD-10-CM | POA: Diagnosis not present

## 2020-02-11 DIAGNOSIS — Z8673 Personal history of transient ischemic attack (TIA), and cerebral infarction without residual deficits: Secondary | ICD-10-CM | POA: Diagnosis not present

## 2020-02-11 DIAGNOSIS — M79601 Pain in right arm: Secondary | ICD-10-CM | POA: Diagnosis not present

## 2020-02-11 DIAGNOSIS — R569 Unspecified convulsions: Secondary | ICD-10-CM | POA: Diagnosis not present

## 2020-02-11 DIAGNOSIS — R29898 Other symptoms and signs involving the musculoskeletal system: Secondary | ICD-10-CM

## 2020-02-11 DIAGNOSIS — E11649 Type 2 diabetes mellitus with hypoglycemia without coma: Secondary | ICD-10-CM

## 2020-02-11 LAB — CREATININE, SERUM
Creatinine, Ser: 0.88 mg/dL (ref 0.44–1.00)
GFR calc Af Amer: >60 mL/min (ref >60)
GFR calc non Af Amer: >60 mL/min (ref >60)

## 2020-02-11 LAB — GLUCOSE, CAPILLARY
Glucose-Capillary: 118 mg/dL — ABNORMAL HIGH (ref 70–99)
Glucose-Capillary: 156 mg/dL — ABNORMAL HIGH (ref 70–99)

## 2020-02-11 LAB — COMPREHENSIVE METABOLIC PANEL
ALT: 9 U/L (ref 0–44)
AST: 19 U/L (ref 15–41)
Albumin: 3 g/dL — ABNORMAL LOW (ref 3.5–5.0)
Alkaline Phosphatase: 115 U/L (ref 38–126)
Anion gap: 10 (ref 5–15)
BUN: 9 mg/dL (ref 8–23)
CO2: 26 mmol/L (ref 22–32)
Calcium: 8.9 mg/dL (ref 8.9–10.3)
Chloride: 109 mmol/L (ref 98–111)
Creatinine, Ser: 0.87 mg/dL (ref 0.44–1.00)
GFR calc Af Amer: >60 mL/min (ref >60)
GFR calc non Af Amer: >60 mL/min (ref >60)
Glucose, Bld: 149 mg/dL — ABNORMAL HIGH (ref 70–99)
Potassium: 4 mmol/L (ref 3.5–5.1)
Sodium: 145 mmol/L (ref 135–145)
Total Bilirubin: 0.5 mg/dL (ref 0.3–1.2)
Total Protein: 6.3 g/dL — ABNORMAL LOW (ref 6.5–8.1)

## 2020-02-11 LAB — CBC
HCT: 38.6 % (ref 36.0–46.0)
Hemoglobin: 11.8 g/dL — ABNORMAL LOW (ref 12.0–15.0)
MCH: 27.2 pg (ref 26.0–34.0)
MCHC: 30.6 g/dL (ref 30.0–36.0)
MCV: 88.9 fL (ref 80.0–100.0)
Platelets: 233 10*3/uL (ref 150–400)
RBC: 4.34 MIL/uL (ref 3.87–5.11)
RDW: 14.4 % (ref 11.5–15.5)
WBC: 6.4 10*3/uL (ref 4.0–10.5)
nRBC: 0 % (ref 0.0–0.2)

## 2020-02-11 LAB — SEDIMENTATION RATE: Sed Rate: 46 mm/hr — ABNORMAL HIGH (ref 0–22)

## 2020-02-11 LAB — SARS CORONAVIRUS 2 (TAT 6-24 HRS): SARS Coronavirus 2: NEGATIVE

## 2020-02-11 LAB — C-REACTIVE PROTEIN: CRP: 0.9 mg/dL (ref <1.0)

## 2020-02-11 LAB — CBG MONITORING, ED: Glucose-Capillary: 222 mg/dL — ABNORMAL HIGH (ref 70–99)

## 2020-02-11 MED ORDER — VITAMIN D (ERGOCALCIFEROL) 1.25 MG (50000 UNIT) PO CAPS: 50000.0000 [IU] | ORAL_CAPSULE | ORAL | Status: DC

## 2020-02-11 MED ORDER — GABAPENTIN 100 MG PO CAPS
300.0000 mg | ORAL_CAPSULE | Freq: Three times a day (TID) | ORAL | Status: DC
  Administered 2020-02-11 (×2): 300 mg via ORAL
  Filled 2020-02-11 (×2): qty 3

## 2020-02-11 MED ORDER — INSULIN GLARGINE 100 UNIT/ML ~~LOC~~ SOLN
10.0000 [IU] | Freq: Every day | SUBCUTANEOUS | Status: DC
  Administered 2020-02-11: 10 [IU] via SUBCUTANEOUS
  Filled 2020-02-11 (×2): qty 0.1

## 2020-02-11 MED ORDER — ENOXAPARIN SODIUM 40 MG/0.4ML ~~LOC~~ SOLN
40.0000 mg | SUBCUTANEOUS | Status: DC
  Administered 2020-02-11: 40 mg via SUBCUTANEOUS
  Filled 2020-02-11: qty 0.4

## 2020-02-11 MED ORDER — IOHEXOL 350 MG/ML SOLN
75.0000 mL | Freq: Once | INTRAVENOUS | Status: AC | PRN
  Administered 2020-02-11: 11:00:00 75 mL via INTRAVENOUS

## 2020-02-11 MED ORDER — NALOXONE HCL 4 MG/0.1ML NA LIQD: NASAL | 0 refills | Status: DC

## 2020-02-11 MED ORDER — GEMFIBROZIL 600 MG PO TABS
600.0000 mg | ORAL_TABLET | Freq: Two times a day (BID) | ORAL | Status: DC
  Administered 2020-02-11: 600 mg via ORAL
  Filled 2020-02-11 (×2): qty 1

## 2020-02-11 MED ORDER — METHOCARBAMOL 500 MG PO TABS
500.0000 mg | ORAL_TABLET | Freq: Two times a day (BID) | ORAL | Status: DC
  Administered 2020-02-11: 500 mg via ORAL
  Filled 2020-02-11: qty 1

## 2020-02-11 MED ORDER — ATORVASTATIN CALCIUM 40 MG PO TABS
40.0000 mg | ORAL_TABLET | Freq: Every day | ORAL | Status: DC
  Administered 2020-02-11: 40 mg via ORAL
  Filled 2020-02-11: qty 1

## 2020-02-11 MED ORDER — CEPHALEXIN 250 MG PO CAPS
250.0000 mg | ORAL_CAPSULE | Freq: Every day | ORAL | Status: DC
  Administered 2020-02-11: 250 mg via ORAL
  Filled 2020-02-11: qty 1

## 2020-02-11 MED ORDER — GABAPENTIN 300 MG PO CAPS: 900.0000 mg | ORAL_CAPSULE | Freq: Three times a day (TID) | ORAL | Status: DC

## 2020-02-11 MED ORDER — INSULIN ASPART 100 UNIT/ML ~~LOC~~ SOLN
0.0000 [IU] | Freq: Three times a day (TID) | SUBCUTANEOUS | Status: DC
  Administered 2020-02-11: 13:00:00 2 [IU] via SUBCUTANEOUS

## 2020-02-11 MED ORDER — ASPIRIN EC 81 MG PO TBEC
81.0000 mg | DELAYED_RELEASE_TABLET | Freq: Every day | ORAL | Status: DC
  Administered 2020-02-11: 81 mg via ORAL
  Filled 2020-02-11: qty 1

## 2020-02-11 MED ORDER — CLOPIDOGREL BISULFATE 75 MG PO TABS
75.0000 mg | ORAL_TABLET | Freq: Every day | ORAL | Status: DC
  Administered 2020-02-11: 75 mg via ORAL
  Filled 2020-02-11: qty 1

## 2020-02-11 MED ORDER — OXYCODONE HCL ER 10 MG PO T12A
10.0000 mg | EXTENDED_RELEASE_TABLET | Freq: Two times a day (BID) | ORAL | Status: DC
  Administered 2020-02-11: 10 mg via ORAL
  Filled 2020-02-11: qty 1

## 2020-02-11 MED ORDER — LEVOTHYROXINE SODIUM 100 MCG PO TABS
100.0000 ug | ORAL_TABLET | Freq: Every day | ORAL | Status: DC
  Administered 2020-02-11: 100 ug via ORAL
  Filled 2020-02-11: qty 1

## 2020-02-11 MED ORDER — VITAMIN B-12 1000 MCG PO TABS
1000.0000 ug | ORAL_TABLET | Freq: Every day | ORAL | Status: DC
  Administered 2020-02-11: 1000 ug via ORAL
  Filled 2020-02-11: qty 1

## 2020-02-11 MED ORDER — OXYBUTYNIN CHLORIDE ER 10 MG PO TB24
10.0000 mg | ORAL_TABLET | Freq: Every day | ORAL | Status: DC
  Administered 2020-02-11: 10 mg via ORAL
  Filled 2020-02-11 (×2): qty 1

## 2020-02-11 NOTE — Progress Notes (Signed)
EEG complete - results pending 

## 2020-02-11 NOTE — Procedures (Signed)
Patient Name: [name]  MRN: [id number]  Epilepsy Attending: Lora Havens  Referring Physician/Provider: Dr Milus Banister Date: 02/11/2020 Duration: 23.35 mins  Patient history: 74 y.o. female with past medical history of CVA January 2021 (left basal ganglia acute infarct) who presented with right-sided weakness and numbness associated with pain, tensing up. EEG to evaluate for seizure.   Level of alertness: awake, asleep  AEDs during EEG study: Gabapentin  Technical aspects: This EEG study was done with scalp electrodes positioned according to the 10-20 International system of electrode placement. Electrical activity was acquired at a sampling rate of 500Hz  and reviewed with a high frequency filter of 70Hz  and a low frequency filter of 1Hz . EEG data were recorded continuously and digitally stored.   DESCRIPTION: The posterior dominant rhythm consists of 8-9 Hz activity of moderate voltage (25-35 uV) seen predominantly in posterior head regions, symmetric and reactive to eye opening and eye closing. Sleep was characterized by vertex waves, maximal frontocentral. EEG showed intermittent generalized 3-5hz  theta-delta slowing. Photic driving was not seen during photic stimulation. Hyperventilation was not performed.  ABNORMALITY - Intermittent slow, generalized   IMPRESSION: This study is suggestive of mild diffuse encephalopathy, non specific to etiology. No seizures or epileptiform discharges were seen throughout the recording.  [name]

## 2020-02-11 NOTE — Care Management Obs Status (Signed)
[place] NOTIFICATION   Patient Details  Name: [name] MRN: [id number] Date of Birth: [birth year]   Medicare Observation Status Notification Given:  Yes    Bartholomew Crews, RN 02/11/2020, 5:38 PM

## 2020-02-11 NOTE — Progress Notes (Addendum)
Family Medicine Teaching Service Daily Progress Note Intern Pager: [phone]  Patient name: [name] Medical record number: [id number] Date of birth: 18-Sep-1946 Age: 74 y.o. Gender: female  Primary Care Provider: Everardo Beals, NP Consultants: Neuro Code Status: DNR  Pt Overview and Major Events to Date:  3/26 Admitted to FPTS  Assessment and Plan: [name] is a 73 y.o. female presenting with right-sided weakness and numbness. PMH is significant for CVA, diabetes, HLD, hypothyrodism, peripheral neuropathy and overactive bladder.   Concern for seizure following CVA Most recent CVA in January 2021 involving left basal ganglia with residual right-sided hemiparesis.  Found to have carotid artery stenosis at that time.  Has been having episodes with right arm tensing, confusion perseveration of words.  Neurology was consulted in the ED and notes that this is a focal seizure versus TIA versus recrudescence of old stroke symptoms.  Recommended repeat CTA which has not yet been performed.  Plan for EEG this AM.  Noted would consider Keppra 500 mg twice daily, this has not been started.  This AM endorses no further episodes, neurologically intact with BUE/BLE 5/5 strength.  -Neuro consulted, appreciate recs -Continue home ASA 81, Plavix 75; additional Lovenox for DVT prophylaxis - frequent neuro checks - EEG - CTA head and neck, carotid dopplers -Seizure precautions -Delirium precautions  -Fall precautions  -PT/OT  Recurrent UTIs Known to have recurrent UTIs. Per patient's PCP is now on Keflex 233m daily for prophylaxis started Jan 31, 2020, did Keflex 5058mBID x 7 days. -Continue home Keflex   Chronic Headaches Reports on going right temporal headaches since Jan 2021, possible visual disturbances but patient is a poor historian. Denies jaw claudication, but does have temporal tenderness to palpation. CRP 0.9 and ESR 46.  Doubt temporal arteritis. -Tylenol 50063mQ6H  History of CVA MRI head: expected evolution of the January lacunar infarct affecting the left corona radiata/basal ganglia. No associated hemorrhage or mass effect and no new intracranial abnormality. Home meds: Aspirin 81 mg, Plavix 75 mg -Continue Aspirin and Plavix  - plan per above  Diabetes Glucose on BMP on admission 205, last A1c 5.5% Jan 2021 A1c was 9.4% 1 year ago  Home meds: Metformin 500 mg BID, Tresiba 40 units.  CBG this a.m. 118. -Hold Tresiba and Metformin -Lantus 10 units  -Sensitive sliding scale insulin -CBGs TID and QHS  - consider decreasing Tresiba on d/c  Hyperlipidemia Lipid panel: wnl, except HDL was low at 24 (Jan 2021). Home meds: Atorvastatin 40 mg, gemfibrozil 600 mg BID  -Continue Atorvastatin and Gemfibrozil - risk of rhabdo with atorvastatin and gemfibrozil, consider change to fenofibrate  Elevated blood pressures Patient's blood pressure has been elevated during this hospitalization, SBP 135-176, DBP 46-87.  She does not have any home antihypertensives listed on her med rec. - cont to monitor - could consider ACE/ARB given DM and appropriate Cr   Hypothyroidism Last TSH slightly decreased at 0.131 in Jan 2021, however was measured in hospital when patient was acutely ill. Patient asymptomatic except for diarrhea/loose stools. Home meds: levothyroxine 100 mcg -Continue levothyroxine -Re-evaluate TSH outpatient  Muscle spasms  Peripheral Neuropathy  Chronic Pain Patient with history of chronic pains, bilateral hip replacements. Home meds: Robaxin 500 mg BID, gabapentin 900m50mD and XR Oxycodone 9mg 68mly  -Continue Robaxin and Oxycodone -Gabapentin 300mg 41mdue to patient being slightly altered  Overactive bladder Home meds: Oxybutynin 10 mg at bedtime -Continue oxybutynin - PurWick in [place]  meds: Vit b12 1000 mcg , vitamin D 50,000 units every Sunday   FEN/GI: Heart healthy/carb modified PPx:  Lovenox  Disposition: Pending completion of work-up and PT/OT recs  Subjective:  Patient states that she would like to leave today.  Denies any further episodes of hand tightening.  No other complaints.  Objective: Temp:  [97.4 F (36.3 C)-98.3 F (36.8 C)] 98.3 F (36.8 C) (03/27 0621) Pulse Rate:  [53-75] 54 (03/27 0621) Resp:  [9-26] 16 (03/27 0621) BP: (135-193)/(46-108) 150/60 (03/27 0621) SpO2:  [96 %-100 %] 100 % (03/27 4098)  Physical Exam:  General: 74 y.o. female in NAD Cardio: RRR no m/r/g Lungs: CTAB, no wheezing, no rhonchi, no crackles, no IWOB on RA Skin: warm and dry Extremities: No edema, 5/5 strength BUE/BLE Neuro: grossly intact, awake, alert   Laboratory: Recent Labs  Lab 02/10/20 1900 02/11/20 0421  WBC 6.0 6.4  HGB 11.4* 11.8*  HCT 38.0 38.6  PLT 224 233   Recent Labs  Lab 02/10/20 1900 02/11/20 0421  NA 141 145  K 4.7 4.0  CL 107 109  CO2 21* 26  BUN 11 9  CREATININE 0.92 0.87  0.88  CALCIUM 8.9 8.9  PROT  --  6.3*  BILITOT  --  0.5  ALKPHOS  --  115  ALT  --  9  AST  --  19  GLUCOSE 205* 149*     Imaging/Diagnostic Tests: DG Forearm Right  Result Date: 02/10/2020 CLINICAL DATA:  Right arm pain. EXAM: RIGHT FOREARM - 2 VIEW COMPARISON:  None. FINDINGS: Cortical margins of the radius and ulna are intact. There is no evidence of fracture or other focal bone lesions. Small olecranon spur. Mild elbow joint degenerative change. Soft tissues are unremarkable. IMPRESSION: No acute abnormality of the right forearm. Mild degenerative change of the elbow. Electronically Signed   By: Keith Rake M.D.   On: 02/10/2020 19:40   MR BRAIN WO CONTRAST  Result Date: 02/10/2020 CLINICAL DATA:  74 year old female with pain and neurologic deficit. Left basal ganglia infarct in January. EXAM: MRI HEAD WITHOUT CONTRAST TECHNIQUE: Multiplanar, multiecho pulse sequences of the brain and surrounding structures were obtained without intravenous  contrast. COMPARISON:  Brain MRI and intracranial MRA 12/13/2019. FINDINGS: Brain: Left corona radiata/lentiform diffusion restriction has largely faded since January, with mild residual trace DWI abnormality on series 5, image 80. Regression of associated T2 and FLAIR hyperintensity. No hemorrhage or mass effect. Elsewhere no restricted diffusion to suggest acute infarction. No midline shift, mass effect, evidence of mass lesion, ventriculomegaly, extra-axial collection or acute intracranial hemorrhage. Cervicomedullary junction and pituitary are within normal limits. Scattered small mostly subcortical nonspecific white matter T2 and FLAIR hyperintense foci are stable. No cortical encephalomalacia or chronic cerebral blood products. Brainstem and cerebellum remain negative. Vascular: Major intracranial vascular flow voids are stable. Skull and upper cervical spine: Negative for age visible cervical spine. Visualized bone marrow signal is within normal limits. Sinuses/Orbits: Stable and negative. Other: Mastoids remain clear. Scalp and face soft tissues appear negative. IMPRESSION: 1. Expected evolution of the January lacunar infarct affecting the left corona radiata/basal ganglia. No associated hemorrhage or mass effect. 2. No new intracranial abnormality. Electronically Signed   By: Genevie Ann M.D.   On: 02/10/2020 21:09   DG Chest Port 1 View  Result Date: 02/10/2020 CLINICAL DATA:  Pain. EXAM: PORTABLE CHEST 1 VIEW COMPARISON:  12/12/2019 FINDINGS: The cardiomediastinal contours are normal. Atherosclerosis of the aortic arch. Chronic interstitial coarsening, unchanged. Pulmonary  vasculature is normal. No consolidation, pleural effusion, or pneumothorax. No acute osseous abnormalities are seen. IMPRESSION: No acute chest findings. Aortic Atherosclerosis (ICD10-I70.0). Electronically Signed   By: Keith Rake M.D.   On: 02/10/2020 19:33   DG Humerus Right  Result Date: 02/10/2020 CLINICAL DATA:  Right arm  pain. EXAM: RIGHT HUMERUS - 2+ VIEW COMPARISON:  None. FINDINGS: Cortical margins of the right humerus are intact. There is no evidence of fracture or other focal bone lesions. Shoulder and elbow alignment are maintained. There is degenerative change of the glenohumeral and elbow joint. Small olecranon spur. Soft tissues are unremarkable. IMPRESSION: No acute bony abnormality of the right humerus. Degenerative change of the shoulder and elbow. Electronically Signed   By: Keith Rake M.D.   On: 02/10/2020 19:39    Sadaf Przybysz, Bernita Raisin, DO 02/11/2020, 8:41 AM PGY-2, [place] Intern pager: [phone], text pages welcome

## 2020-02-11 NOTE — Consult Note (Signed)
Requesting Physician: Dr. Owens Shark    Chief Complaint:  Right side weakness  History obtained from: Patient and Chart  HPI:                                                                                                                                       [name] is a 74 y.o. female with past medical history of CVA January 2021 (left basal ganglia acute infarct), asymptomatic right carotid stenosis, hyperlipidemia, type 2 diabetes mellitus, hypertension, hypothyroidism, recent urinary tract infection presents to the ED with right-sided weakness and numbness associated with pain.  According to the daughter, around [place] got a call saying patient was crying in pain from her right arm and unable to lift it.  When daughter arrived, patient was not able to move her right arm and touching her shoulder was very tender.  Patient states that her arm tensed up prior to this.  EMS was called and patient's symptoms started to improve and has subsequently resolved completely around 9 PM.  She is a similar episode 1 week ago when daughter noticed that her right arm tensed up lasting about 8 to 10 minutes associated with confusion and perseveration of words.  Symptoms resolved spontaneously, her PCP told that this was likely a TIA.  Patient also recently diagnosed with UTI and finishing course of Keflex.  Chart review -Patient diagnosed with left basal ganglia acute infarction thought to be secondary to small vessel disease.  Also COVID-19 positive incidentally.  Work-up for stroke revealed left carotid stenosis of 40 to 59% and right ICA bulb stenosis of close to 80%.  Etiology of stroke thought to be secondary small vessel disease, patient placed on dual antiplatelet therapy for 3 weeks-however continues to be on aspirin Plavix currently.  Other work-up included LDL of 77.  A1c 5.5    Past Medical History:  Diagnosis Date  . Arthritis   . Bilateral lower extremity edema   . Flank pain   . Full  dentures   . History of cervical cancer    11/ 1994  Stage IB  s/p  high dose radiation brachytherapy @ [facility] 01/ 1995--- per pt no recurrence  . History of chronic bronchitis   . History of hyperthyroidism    d 03/ 2011---due to grave's disease--- s/p  RAI treatment 04/ 2011  . History of sepsis 05/03/2018   due to UTI with klebsiella/ pyelonephritis/ ureteral obstruction cause by stone  . Hyperlipidemia   . Hypothyroidism, postradioiodine therapy    endocrinologist--  dr Loanne Drilling--  dx graves disease and s/p RAI i131 treatement 4/ 2011  . Nauseated   . Oral thrush 05/03/2018  . Pneumonia   . Type 2 diabetes mellitus treated with insulin ([place])    FOLLOWED BY PCP  . Urgency of urination   . Wears glasses     Past Surgical History:  Procedure Laterality Date  .  APPENDECTOMY    . CATARACT EXTRACTION W/ INTRAOCULAR LENS  IMPLANT, BILATERAL  2004  approx.  . CYSTOSCOPY WITH STENT PLACEMENT Right 05/04/2018   Procedure: CYSTOSCOPY WITH STENT PLACEMENT AND RETROGRADE PYELOGRAM;  Surgeon: Ceasar Mons, MD;  Location: [facility];  Service: Urology;  Laterality: Right;  . CYSTOSCOPY/URETEROSCOPY/HOLMIUM LASER/STENT PLACEMENT Right 05/19/2018   Procedure: CYSTOSCOPY, URETEROSCOPY/HOLMIUM LASER, STONE BASKETRY/ STENT EXCHANGE;  Surgeon: Ceasar Mons, MD;  Location: [facility];  Service: Urology;  Laterality: Right;  . EXCISION MASS LEFT CHEST WALL  09-20-2007   dr Grandville Silos  [facility]  . Radioactive Iodine Therapy     for thyroid  . REVISION TOTAL HIP ARTHROPLASTY Left early 2000s  . TANDEM RING INSERTION  1995   dr Aldean Ast @ [facility]   EUA w/ tandem placement in ovid  (for direct high dose radiation brachytherapy , cervical cancer)  . TOTAL HIP ARTHROPLASTY Left 1990s  . TRANSTHORACIC ECHOCARDIOGRAM  08/07/2017   mild focal basal hypertrophy of the septum,  ef [id number], grade 1 diastolic dysfunction/  atrial septum with lipomatous hypertrophy/  trivial PR     Family History  Problem Relation Age of Onset  . Emphysema Father   . Diabetes Father   . Emphysema Sister   . Emphysema Brother   . Cancer Mother        Skin Cancer  . Diabetes Mother    Social History:  reports that she quit smoking about 37 years ago. Her smoking use included cigarettes. She quit after 5.00 years of use. She has never used smokeless tobacco. She reports that she does not drink alcohol or use drugs.  Allergies:  Allergies  Allergen Reactions  . Codeine Nausea Only    Medications:                                                                                                                        I reviewed home medications   ROS:                                                                                                                                     14 systems reviewed and negative except above    Examination:  General: Appears well-developed  Psych: Affect appropriate to situation Eyes: No scleral injection HENT: No OP obstrucion Head: Normocephalic.  Cardiovascular: Normal rate and regular rhythm.  Respiratory: Effort normal and breath sounds normal to anterior ascultation GI: Soft.  No distension. There is no tenderness.  Skin: WDI    Neurological Examination Mental Status: Alert, oriented x3.  Speech fluent without evidence of aphasia. Able to follow 3 step commands without difficulty. Cranial Nerves: II: Visual fields grossly normal,  III,IV, VI: ptosis not present, extra-ocular motions intact bilaterally, pupils equal, round, reactive to light and accommodation V,VII: smile symmetric, facial light touch sensation normal bilaterally VIII: hearing normal bilaterally IX,X: uvula rises symmetrically XI: bilateral shoulder shrug XII: midline tongue extension Motor: Right : Upper extremity   4/5    Left:     Upper extremity    5/5  Lower extremity   4/5     Lower extremity   4+/5 Tone and bulk: Increased tone in bilateral lower extremities, mild increased tone in the left upper extremity Sensory: Reduced sensation to light touch the left arm and left leg Deep Tendon Reflexes: 3+ biceps on the left, 2+ on the right.  Patellar reflexes 2+ bilaterally.  No ankle clonus Plantars: Right: downgoing   Left: downgoing Cerebellar: No gross ataxia      Lab Results: Basic Metabolic Panel: Recent Labs  Lab 02/10/20 1900  NA 141  K 4.7  CL 107  CO2 21*  GLUCOSE 205*  BUN 11  CREATININE 0.92  CALCIUM 8.9    CBC: Recent Labs  Lab 02/10/20 1900  WBC 6.0  NEUTROABS 3.8  HGB 11.4*  HCT 38.0  MCV 89.4  PLT 224    Coagulation Studies: No results for input(s): LABPROT, INR in the last 72 hours.  Imaging: DG Forearm Right  Result Date: 02/10/2020 CLINICAL DATA:  Right arm pain. EXAM: RIGHT FOREARM - 2 VIEW COMPARISON:  None. FINDINGS: Cortical margins of the radius and ulna are intact. There is no evidence of fracture or other focal bone lesions. Small olecranon spur. Mild elbow joint degenerative change. Soft tissues are unremarkable. IMPRESSION: No acute abnormality of the right forearm. Mild degenerative change of the elbow. Electronically Signed   By: Keith Rake M.D.   On: 02/10/2020 19:40   MR BRAIN WO CONTRAST  Result Date: 02/10/2020 CLINICAL DATA:  75 year old female with pain and neurologic deficit. Left basal ganglia infarct in January. EXAM: MRI HEAD WITHOUT CONTRAST TECHNIQUE: Multiplanar, multiecho pulse sequences of the brain and surrounding structures were obtained without intravenous contrast. COMPARISON:  Brain MRI and intracranial MRA 12/13/2019. FINDINGS: Brain: Left corona radiata/lentiform diffusion restriction has largely faded since January, with mild residual trace DWI abnormality on series 5, image 80. Regression of associated T2 and FLAIR hyperintensity. No hemorrhage or mass  effect. Elsewhere no restricted diffusion to suggest acute infarction. No midline shift, mass effect, evidence of mass lesion, ventriculomegaly, extra-axial collection or acute intracranial hemorrhage. Cervicomedullary junction and pituitary are within normal limits. Scattered small mostly subcortical nonspecific white matter T2 and FLAIR hyperintense foci are stable. No cortical encephalomalacia or chronic cerebral blood products. Brainstem and cerebellum remain negative. Vascular: Major intracranial vascular flow voids are stable. Skull and upper cervical spine: Negative for age visible cervical spine. Visualized bone marrow signal is within normal limits. Sinuses/Orbits: Stable and negative. Other: Mastoids remain clear. Scalp and face soft tissues appear negative. IMPRESSION: 1. Expected evolution of the January lacunar infarct affecting the left corona radiata/basal ganglia. No associated  hemorrhage or mass effect. 2. No new intracranial abnormality. Electronically Signed   By: Genevie Ann M.D.   On: 02/10/2020 21:09   DG Chest Port 1 View  Result Date: 02/10/2020 CLINICAL DATA:  Pain. EXAM: PORTABLE CHEST 1 VIEW COMPARISON:  12/12/2019 FINDINGS: The cardiomediastinal contours are normal. Atherosclerosis of the aortic arch. Chronic interstitial coarsening, unchanged. Pulmonary vasculature is normal. No consolidation, pleural effusion, or pneumothorax. No acute osseous abnormalities are seen. IMPRESSION: No acute chest findings. Aortic Atherosclerosis (ICD10-I70.0). Electronically Signed   By: Keith Rake M.D.   On: 02/10/2020 19:33   DG Humerus Right  Result Date: 02/10/2020 CLINICAL DATA:  Right arm pain. EXAM: RIGHT HUMERUS - 2+ VIEW COMPARISON:  None. FINDINGS: Cortical margins of the right humerus are intact. There is no evidence of fracture or other focal bone lesions. Shoulder and elbow alignment are maintained. There is degenerative change of the glenohumeral and elbow joint. Small olecranon  spur. Soft tissues are unremarkable. IMPRESSION: No acute bony abnormality of the right humerus. Degenerative change of the shoulder and elbow. Electronically Signed   By: Keith Rake M.D.   On: 02/10/2020 19:39     ASSESSMENT AND PLAN  75 y.o. female with past medical history of CVA January 2021 (left basal ganglia acute infarct), asymptomatic right carotid stenosis, hyperlipidemia, type 2 diabetes mellitus, hypertension, hypothyroidism, recent urinary tract infection presents to the ED with right-sided weakness and numbness associated with pain, tensing up. Description of event raises suspicion for seizure however patient's prior stroke is subcortical which would be unusual for seizure focus.  Stereotypical episodes would less likely to be TIA unless patient she has significant atherosclerotic disease on the left side,will recheck carotids and intracranial imaging to make sure patient does not have new symptomatic left carotid stenosis  Focal seizure versus transient ischemic attack versus recrudescence of old stroke symptoms   Recommendations Repeat CTA head and neck to assess for any new symptomatic carotid stenosis on the left side. No need to repeat echo, A1c lipid profile Routine EEG Consider empiric Keppra 500 mg twice daily Frequent neurochecks   Neurology continue to follow  [place] Pager Number [phone]

## 2020-02-11 NOTE — Evaluation (Signed)
Occupational Therapy Evaluation Patient Details Name: [name] MRN: [id number] DOB: 18-Dec-1945 Today's Date: 02/11/2020    History of Present Illness 74 y.o. female presenting with right-sided weakness and numbness. PMH is significant for CVA, diabetes, HLD, hypothyrodism, peripheral neuropathy and overactive bladder.    Clinical Impression   This 74 y/o female presents with the above. PTA pt reports was mod independent with ADL and functional mobility using rollator. Today pt completing room level mobility using RW with close minguard-minA throughout, completing standing grooming ADL with minguard assist, and requiring minguard-minA for LB ADL. Pt requiring min cues for safe use of RW throughout. She reports she feels about at her baseline regarding ADL/mobility status, reports initial RUE symptoms have since subsided and with no notable RUE deficits noted during this session. Pt's daughter also present start of session and supportive, pt reports she lives with spouse and daughter checks in daily. Pt will benefit from continued acute OT services, currently recommend follow up [facility] services after discharge to maximize her overall safety and independence with ADL and mobility. Will follow.     Follow Up Recommendations  Home health OT;Supervision/Assistance - 24 hour(HH vs none, pending progress)    Equipment Recommendations  None recommended by OT           Precautions / Restrictions Precautions Precautions: Fall Restrictions Weight Bearing Restrictions: No      Mobility Bed Mobility Overal bed mobility: Needs Assistance Bed Mobility: Supine to Sit;Sit to Supine     Supine to sit: Supervision Sit to supine: Supervision   General bed mobility comments: Supervision for safety. Cues for hand placement. required extra time.  Transfers Overall transfer level: Needs assistance Equipment used: Rolling walker (2 wheeled) Transfers: Sit to/from Stand Sit to Stand:  Supervision         General transfer comment: Supervision for safety. Good power up and control with sit. demonstrates safe hand placement    Balance Overall balance assessment: Needs assistance Sitting-balance support: No upper extremity supported;Feet supported Sitting balance-Leahy Scale: Good     Standing balance support: No upper extremity supported Standing balance-Leahy Scale: Fair                             ADL either performed or assessed with clinical judgement   ADL Overall ADL's : Needs assistance/impaired Eating/Feeding: Modified independent;Sitting   Grooming: Min guard;Standing;Wash/dry hands   Upper Body Bathing: Set up;Supervision/ safety;Sitting   Lower Body Bathing: Min guard;Sit to/from stand   Upper Body Dressing : Set up;Sitting   Lower Body Dressing: Min guard;Minimal assistance;Sit to/from stand   Toilet Transfer: Min guard;Ambulation;RW   Toileting- Clothing Manipulation and Hygiene: Sit to/from stand;Min guard;Minimal assistance       Functional mobility during ADLs: Min guard;Rolling walker General ADL Comments: requires min cues for safe use of RW     Vision Patient Visual Report: No change from baseline Vision Assessment?: No apparent visual deficits     Perception     Praxis      Pertinent Vitals/Pain Pain Assessment: No/denies pain     Hand Dominance Right   Extremity/Trunk Assessment Upper Extremity Assessment Upper Extremity Assessment: Generalized weakness   Lower Extremity Assessment Lower Extremity Assessment: Defer to PT evaluation       Communication Communication Communication: No difficulties   Cognition Arousal/Alertness: Awake/alert Behavior During Therapy: WFL for tasks assessed/performed Overall Cognitive Status: Impaired/Different from baseline Area of Impairment: Problem solving;Memory  Memory: Decreased short-term memory       Problem Solving: Slow  processing General Comments: tangential at times   General Comments  HR sustaining in the 90s with room level activity     Exercises     Shoulder Instructions      Home Living Family/patient expects to be discharged to:: Private residence Living Arrangements: Children;Spouse/significant other Available Help at Discharge: Family;Available 24 hours/day Type of Home: House Home Access: Ramped entrance     Home Layout: One level     Bathroom Shower/Tub: Occupational psychologist: Handicapped height     Home Equipment: Bedside commode;Shower seat;Walker - 2 wheels;Cane - single point;Grab bars - tub/shower;Other (comment);Wheelchair - [employer] - 4 wheels          Prior Functioning/Environment Level of Independence: Independent with assistive device(s)  Gait / Transfers Assistance Needed: Using rollator PTA ADL's / Homemaking Assistance Needed: typically able to complete ADL without assist    Comments: Getting PT at home for past month.        OT Problem List: Decreased strength;Decreased activity tolerance;Impaired balance (sitting and/or standing);Decreased knowledge of use of DME or AE;Decreased knowledge of precautions;Decreased cognition;Decreased safety awareness      OT Treatment/Interventions: Self-care/ADL training;Energy conservation;DME and/or AE instruction;Patient/family education;Balance training;Therapeutic activities;Therapeutic exercise;Neuromuscular education;Cognitive remediation/compensation    OT Goals(Current goals can be found in the care plan section) Acute Rehab OT Goals Patient Stated Goal: "home today" OT Goal Formulation: With patient Time For Goal Achievement: 02/25/20 Potential to Achieve Goals: Good  OT Frequency: Min 2X/week   Barriers to D/C:            Co-evaluation              AM-PAC OT "6 Clicks" Daily Activity     Outcome Measure Help from another person eating meals?: None Help from another person taking  care of personal grooming?: None Help from another person toileting, which includes using toliet, bedpan, or urinal?: A Little Help from another person bathing (including washing, rinsing, drying)?: A Little Help from another person to put on and taking off regular upper body clothing?: None Help from another person to put on and taking off regular lower body clothing?: A Little 6 Click Score: 21   End of Session Equipment Utilized During Treatment: Gait belt;Rolling walker Nurse Communication: Mobility status  Activity Tolerance: Patient tolerated treatment well Patient left: in bed;with call bell/phone within reach;with bed alarm set  OT Visit Diagnosis: Muscle weakness (generalized) (M62.81);Unsteadiness on feet (R26.81);Other symptoms and signs involving the nervous system (R29.898)                Time: 5329-9242 OT Time Calculation (min): 16 min Charges:  OT General Charges $OT Visit: 1 Visit OT Evaluation $OT Eval Moderate Complexity: [place], OT Acute Rehabilitation Services Pager [phone] Office [phone]  Raymondo Band 02/11/2020, 2:08 PM

## 2020-02-11 NOTE — Progress Notes (Addendum)
EEG with no abnormalities other than intermittent slowing More detailed history from family also suggests a component of post stroke pain Already on Neurontin 300 TID.   Recs: -No AEDs -Increase neurontin to 400 TID or 300 QID (your and patient preference) - has good AED value too although not sure if she has true seizures. -Amitryptyline would be ideal but with opiates, muscle relaxants and neurontin already on the list of meds, I fear it will cause sedating side effects. -Maker an attempt at every visit outpatient to reduce as many sedating meds as possible as you are. -Family reports on going cognitive decline - may benefit from outpatient neuropsych testing -If symptoms recur - return back to ER. Wil need to do LTM EEG then.  Talked in detail with daughter at bedside and granddaughter over speakerphone.  Relayed plan to primary team resident physician   -- Amie Portland, MD [facility] Pager: [phone] If 7pm to 7am, please call on call as listed on AMION.

## 2020-02-11 NOTE — Evaluation (Signed)
Physical Therapy Evaluation and Discharge Patient Details Name: [name] MRN: [id number] DOB: 07/23/1946 Today's Date: 02/11/2020   History of Present Illness  74 y.o. female presenting with right-sided weakness and numbness. PMH is significant for CVA, diabetes, HLD, hypothyrodism, peripheral neuropathy and overactive bladder.   Clinical Impression  Patient evaluated by Physical Therapy with no further acute PT needs identified. All education has been completed and the patient has no further questions. Patient and daughter report functional mobility and gait nearly back to baseline. Supervision was required for safety with assessment today however pt did not demonstrate any overt loss of balance or unsafe/unaware activities. She will have supervision at home and has been working diligently with [facility]. Daughter reports significant gains with HHPT over the past month and I agree she will likely benefit from continued skilled PT at home. See below for any follow-up Physical Therapy or equipment needs. PT is signing off. Thank you for this referral.     Follow Up Recommendations Home health PT    Equipment Recommendations  None recommended by PT    Recommendations for Other Services       Precautions / Restrictions Precautions Precautions: Fall Restrictions Weight Bearing Restrictions: No      Mobility  Bed Mobility Overal bed mobility: Needs Assistance Bed Mobility: Supine to Sit;Sit to Supine     Supine to sit: Supervision Sit to supine: Supervision   General bed mobility comments: Supervision for safety. Cues for hand placement. required extra time.  Transfers Overall transfer level: Needs assistance Equipment used: Rolling walker (2 wheeled) Transfers: Sit to/from Stand Sit to Stand: Supervision         General transfer comment: Supervision for safety. Good power up and control with sit. cues for hand placement.  Ambulation/Gait Ambulation/Gait assistance:  Supervision Gait Distance (Feet): 175 Feet Assistive device: Rolling walker (2 wheeled) Gait Pattern/deviations: Decreased stride length;Narrow base of support;Shuffle;Decreased stance time - left;Antalgic Gait velocity: decreased Gait velocity interpretation: <1.8 ft/sec, indicate of risk for recurrent falls General Gait Details: Moderately antalgic gait pattern, no overt instability noted while utilizing RW for support. Rollator at baseline, demonstrates good control and proximity to RW. Supervision for safety with bout.  Stairs            Wheelchair Mobility    Modified Rankin (Stroke Patients Only) Modified Rankin (Stroke Patients Only) Pre-Morbid Rankin Score: Moderate disability Modified Rankin: Moderate disability     Balance Overall balance assessment: Needs assistance Sitting-balance support: No upper extremity supported;Feet supported Sitting balance-Leahy Scale: Good     Standing balance support: No upper extremity supported Standing balance-Leahy Scale: Fair                               Pertinent Vitals/Pain Pain Assessment: No/denies pain    Home Living Family/patient expects to be discharged to:: Private residence Living Arrangements: Children;Spouse/significant other Available Help at Discharge: Family;Available 24 hours/day Type of Home: House Home Access: Ramped entrance     Home Layout: One level Home Equipment: Bedside commode;Shower seat;Walker - 2 wheels;Cane - single point;Grab bars - tub/shower;Other (comment);Wheelchair - [employer] - 4 wheels      Prior Function Level of Independence: Independent with assistive device(s)   Gait / Transfers Assistance Needed: Using rollator PTA     Comments: Getting PT at home for past month.     Hand Dominance   Dominant Hand: Right    Extremity/Trunk Assessment  Upper Extremity Assessment Upper Extremity Assessment: Defer to OT evaluation    Lower Extremity  Assessment Lower Extremity Assessment: Generalized weakness       Communication   Communication: No difficulties  Cognition Arousal/Alertness: Awake/alert Behavior During Therapy: WFL for tasks assessed/performed Overall Cognitive Status: Impaired/Different from baseline Area of Impairment: Problem solving                             Problem Solving: Slow processing        General Comments General comments (skin integrity, edema, etc.): HR in 90s with ambulation, no overt dyspnea noted.    Exercises     Assessment/Plan    PT Assessment All further PT needs can be met in the next venue of care  PT Problem List Decreased strength;Decreased mobility;Decreased range of motion;Decreased knowledge of precautions;Decreased activity tolerance;Decreased cognition;Decreased balance;Decreased knowledge of use of DME       PT Treatment Interventions      PT Goals (Current goals can be found in the Care Plan section)  Acute Rehab PT Goals Patient Stated Goal: return home PT Goal Formulation: All assessment and education complete, DC therapy    Frequency     Barriers to discharge        Co-evaluation               AM-PAC PT "6 Clicks" Mobility  Outcome Measure Help needed turning from your back to your side while in a flat bed without using bedrails?: None Help needed moving from lying on your back to sitting on the side of a flat bed without using bedrails?: A Little Help needed moving to and from a bed to a chair (including a wheelchair)?: A Little Help needed standing up from a chair using your arms (e.g., wheelchair or bedside chair)?: A Little Help needed to walk in hospital room?: A Little Help needed climbing 3-5 steps with a railing? : A Little 6 Click Score: 19    End of Session Equipment Utilized During Treatment: Gait belt Activity Tolerance: Patient tolerated treatment well Patient left: in bed;with call bell/phone within reach;with bed alarm  set;with family/visitor present   PT Visit Diagnosis: Other abnormalities of gait and mobility (R26.89);Muscle weakness (generalized) (M62.81)    Time: 3159-4585 PT Time Calculation (min) (ACUTE ONLY): 21 min   Charges:   PT Evaluation $PT Eval Low Complexity: 1 Low          [place], PT   Ellouise Newer 02/11/2020, 10:54 AM

## 2020-02-11 NOTE — ED Notes (Addendum)
Checked in on Pt. She said she was ok but having trouble going to sleep but she did not need anything at this time. I lasked to call if she needed anything and she said she would. Vitals were WDL but not transmitting to narrator for validation. Will continue to monitor.

## 2020-02-11 NOTE — Plan of Care (Addendum)
Signed out by Dr. Lorraine Lax who saw the patient this morning to follow-up on the EEG and CTA head. EEG still pending. CTA head with no new findings.  Known 70% right ICA stenosis.  No left ICA significant stenosis or occlusion. Seizures remain high in the differential due to stereotypic nature as well as recrudescence of old stroke symptoms given the location-small vessel etiology causing fluctuating symptoms  -Await EEG. -Hold off on antiepileptics for now.  We will follow with you.  -- Amie Portland, MD [facility] Pager: [phone] If 7pm to 7am, please call on call as listed on [facility]ION.

## 2020-02-11 NOTE — Discharge Summary (Addendum)
[facility] Discharge Summary  Patient name: [name] Medical record number: [id number] Date of birth: 07/12/1946 Age: 74 y.o. Gender: female Date of Admission: 02/10/2020  Date of Discharge: 02/11/2020 Admitting Physician: Martyn Malay, MD  Primary Care Provider: Everardo Beals, NP Consultants: Neuro  Indication for Hospitalization: Right Sided weakness  Discharge Diagnoses/Problem List:  Concern for seizure following CVA Recurrent UTIs Chronic headaches History of CVA Diabetes HLD Elevated blood pressures Hypothyroidism Muscle spasms Peripheral neuropathy Chronic pain Overactive bladder Nutrition  Disposition: home with [facility]  Discharge Condition: stable, improved  Discharge Exam:   Physical Exam:  General: 74 y.o. female in NAD Cardio: RRR no m/r/g Lungs: CTAB, no wheezing, no rhonchi, no crackles, no IWOB on RA Skin: warm and dry Extremities: No edema, 5/5 strength BUE/BLE Neuro: grossly intact, awake, alert  Brief Hospital Course:  [name] a 74 y.o.femalepresenting with right-sided weakness and numbness. PMH is significant forCVA, diabetes, HLD, hypothyrodism, peripheral neuropathyandoveractive bladder. Her hospital course is outlined below.  Concern for seizure following CVA Patient had CVA in January 2021 involving left basal ganglia with residual right-sided hemiparesis.  Also found to have carotid artery stenosis at that time.  Admission details can be found in H&P.  Was admitted after having episodes of right arm tensing and confusion.  MRI in ED showed expected evolution of January lacunar infarct affecting left corona radiata/basal ganglia.  Chest x-ray and initial lab work were unremarkable aside from a hemoglobin of 11.4.  Neurology was consulted in the ED and recommended repeat CTA to assess for worsening of carotid artery stenosis.  This was performed and showed no changes.  EEG also performed, did  not show any seizure-like activity.  Neuro believe most likely recrudescence of old stroke, but advised patient to return to the ER if symptoms recur as she would need LTM EEG at that time.  Also recommended decreasing sedating medications given that she is on a large amount.  Her Robaxin was discontinued prior to discharge.  Patient was monitored throughout her admission and had no recurrence of right-sided arm tightening.  At the time of discharge, her vital signs were stable and she is without complaint.  She was discharged home with home health services and advised to contact her PCP on 3/29 to schedule appointment.  Patient's other chronic medical issues were addressed as needed throughout her hospitalization without change to her home regimen.  Issues for Follow Up:  1. Consider change from gemfibrozil to fenofibrate.  Gemfibrozil and atorvastatin have a high risk of rhabdomyolysis when combined. 2. Discontinued methocarbamol given that this is a Beers list medication.  Consider baclofen as outpatient. 3. Patient is on a significant number of sedating medications, consider working to decrease this at every outpatient visit. 4. Patient advised that if she has another episode of arm tightening, that she should come to the ED right away, would need LTM EEG then. 5. Patient's A1c was 5.5 in January.  Consider decreasing her diabetes regimen. 6. Consider neuropsychiatry referral as an outpatient given that her family noted an overall decline in her mental status while she was hospitalized. 7. Patient reported chronic intermittent headaches on admission.  ESR was below 50 and CRP within normal limits.  Temporal arteritis was less likely given this.  Recommend outpatient ophthalmology referral. 8. Recommend outpatient colonoscopy given patient's hemoglobin 11.8 on discharge.  It appears that she had a colonoscopy in 2019 that could not be completed due to inadequate prep.  Significant Procedures:  EEG  Significant Labs and Imaging:  Recent Labs  Lab 02/10/20 1900 02/11/20 0421  WBC 6.0 6.4  HGB 11.4* 11.8*  HCT 38.0 38.6  PLT 224 233   Recent Labs  Lab 02/10/20 1900 02/11/20 0421  NA 141 145  K 4.7 4.0  CL 107 109  CO2 21* 26  GLUCOSE 205* 149*  BUN 11 9  CREATININE 0.92 0.87  0.88  CALCIUM 8.9 8.9  ALKPHOS  --  115  AST  --  19  ALT  --  9  ALBUMIN  --  3.0*    CT ANGIO HEAD W OR WO CONTRAST  Result Date: 02/11/2020 CLINICAL DATA:  Stroke follow-up EXAM: CT ANGIOGRAPHY HEAD AND NECK TECHNIQUE: Multidetector CT imaging of the head and neck was performed using the standard protocol during bolus administration of intravenous contrast. Multiplanar CT image reconstructions and MIPs were obtained to evaluate the vascular anatomy. Carotid stenosis measurements (when applicable) are obtained utilizing NASCET criteria, using the distal internal carotid diameter as the denominator. CONTRAST:  5m OMNIPAQUE IOHEXOL 350 MG/ML SOLN COMPARISON:  CTA neck 12/14/2019 FINDINGS: CT HEAD FINDINGS Brain: Hypodensity left corona radiata compatible with infarct from January of this year. No associated hemorrhage. Ventricle size normal.  No acute hemorrhage or mass. Vascular: Negative for hyperdense vessel Skull: Normal Sinuses: Paranasal sinuses clear. Orbits: Bilateral cataract extraction Review of the MIP images confirms the above findings CTA NECK FINDINGS Aortic arch: Mild atherosclerotic disease in the aortic arch and proximal great vessels without significant stenosis. Right carotid system: Noncalcified plaque right common carotid artery without significant stenosis. Densely calcified plaque proximal right internal carotid artery. Lumen narrowed to 1.2 mm corresponding to 70% diameter stenosis, unchanged from the prior study. Left carotid system: Mild noncalcified plaque left common carotid artery without significant stenosis. Left carotid bifurcation widely patent. Vertebral arteries:  Moderate stenosis origin of left vertebral artery and proximal left vertebral artery unchanged. Mild stenosis proximal right vertebral artery due to calcific stenosis. Both vertebral arteries contribute to the basilar. Skeleton: No acute skeletal abnormality. Other neck: Negative Upper chest: Mild apical lung scarring bilaterally without acute abnormality. Review of the MIP images confirms the above findings CTA HEAD FINDINGS Anterior circulation: Atherosclerotic calcification cavernous carotid bilaterally without stenosis. Anterior and middle cerebral arteries patent bilaterally without stenosis or large vessel occlusion. Posterior circulation: Both vertebral arteries patent to the basilar. PICA patent bilaterally. Basilar widely patent. Superior cerebellar and posterior cerebral arteries patent bilaterally. Mild to moderate stenosis right P1/P2 junction. Venous sinuses: Normal venous enhancement Anatomic variants: None Review of the MIP images confirms the above findings IMPRESSION: 1. Chronic infarct left Corona radiata.  No associated hemorrhage. 2. 70% stenosis proximal right internal carotid artery due to calcific plaque. 3. Left carotid artery widely patent with mild atherosclerotic disease in the common carotid on the left. 4. Moderate stenosis proximal left vertebral artery and mild stenosis proximal right vertebral artery 5. Negative for intracranial large vessel occlusion. Mild to moderate stenosis right P1/P2 segment. Electronically Signed   By: CFranchot GalloM.D.   On: 02/11/2020 11:29   DG Forearm Right  Result Date: 02/10/2020 CLINICAL DATA:  Right arm pain. EXAM: RIGHT FOREARM - 2 VIEW COMPARISON:  None. FINDINGS: Cortical margins of the radius and ulna are intact. There is no evidence of fracture or other focal bone lesions. Small olecranon spur. Mild elbow joint degenerative change. Soft tissues are unremarkable. IMPRESSION: No acute abnormality of the right forearm. Mild degenerative change  of the elbow. Electronically Signed   By: Keith Rake M.D.   On: 02/10/2020 19:40   CT ANGIO NECK W OR WO CONTRAST  Result Date: 02/11/2020 CLINICAL DATA:  Stroke follow-up EXAM: CT ANGIOGRAPHY HEAD AND NECK TECHNIQUE: Multidetector CT imaging of the head and neck was performed using the standard protocol during bolus administration of intravenous contrast. Multiplanar CT image reconstructions and MIPs were obtained to evaluate the vascular anatomy. Carotid stenosis measurements (when applicable) are obtained utilizing NASCET criteria, using the distal internal carotid diameter as the denominator. CONTRAST:  70m OMNIPAQUE IOHEXOL 350 MG/ML SOLN COMPARISON:  CTA neck 12/14/2019 FINDINGS: CT HEAD FINDINGS Brain: Hypodensity left corona radiata compatible with infarct from January of this year. No associated hemorrhage. Ventricle size normal.  No acute hemorrhage or mass. Vascular: Negative for hyperdense vessel Skull: Normal Sinuses: Paranasal sinuses clear. Orbits: Bilateral cataract extraction Review of the MIP images confirms the above findings CTA NECK FINDINGS Aortic arch: Mild atherosclerotic disease in the aortic arch and proximal great vessels without significant stenosis. Right carotid system: Noncalcified plaque right common carotid artery without significant stenosis. Densely calcified plaque proximal right internal carotid artery. Lumen narrowed to 1.2 mm corresponding to 70% diameter stenosis, unchanged from the prior study. Left carotid system: Mild noncalcified plaque left common carotid artery without significant stenosis. Left carotid bifurcation widely patent. Vertebral arteries: Moderate stenosis origin of left vertebral artery and proximal left vertebral artery unchanged. Mild stenosis proximal right vertebral artery due to calcific stenosis. Both vertebral arteries contribute to the basilar. Skeleton: No acute skeletal abnormality. Other neck: Negative Upper chest: Mild apical lung  scarring bilaterally without acute abnormality. Review of the MIP images confirms the above findings CTA HEAD FINDINGS Anterior circulation: Atherosclerotic calcification cavernous carotid bilaterally without stenosis. Anterior and middle cerebral arteries patent bilaterally without stenosis or large vessel occlusion. Posterior circulation: Both vertebral arteries patent to the basilar. PICA patent bilaterally. Basilar widely patent. Superior cerebellar and posterior cerebral arteries patent bilaterally. Mild to moderate stenosis right P1/P2 junction. Venous sinuses: Normal venous enhancement Anatomic variants: None Review of the MIP images confirms the above findings IMPRESSION: 1. Chronic infarct left Corona radiata.  No associated hemorrhage. 2. 70% stenosis proximal right internal carotid artery due to calcific plaque. 3. Left carotid artery widely patent with mild atherosclerotic disease in the common carotid on the left. 4. Moderate stenosis proximal left vertebral artery and mild stenosis proximal right vertebral artery 5. Negative for intracranial large vessel occlusion. Mild to moderate stenosis right P1/P2 segment. Electronically Signed   By: CFranchot GalloM.D.   On: 02/11/2020 11:29   MR BRAIN WO CONTRAST  Result Date: 02/10/2020 CLINICAL DATA:  74year old female with pain and neurologic deficit. Left basal ganglia infarct in January. EXAM: MRI HEAD WITHOUT CONTRAST TECHNIQUE: Multiplanar, multiecho pulse sequences of the brain and surrounding structures were obtained without intravenous contrast. COMPARISON:  Brain MRI and intracranial MRA 12/13/2019. FINDINGS: Brain: Left corona radiata/lentiform diffusion restriction has largely faded since January, with mild residual trace DWI abnormality on series 5, image 80. Regression of associated T2 and FLAIR hyperintensity. No hemorrhage or mass effect. Elsewhere no restricted diffusion to suggest acute infarction. No midline shift, mass effect, evidence  of mass lesion, ventriculomegaly, extra-axial collection or acute intracranial hemorrhage. Cervicomedullary junction and pituitary are within normal limits. Scattered small mostly subcortical nonspecific white matter T2 and FLAIR hyperintense foci are stable. No cortical encephalomalacia or chronic cerebral blood products. Brainstem and cerebellum remain negative. Vascular: Major intracranial vascular  flow voids are stable. Skull and upper cervical spine: Negative for age visible cervical spine. Visualized bone marrow signal is within normal limits. Sinuses/Orbits: Stable and negative. Other: Mastoids remain clear. Scalp and face soft tissues appear negative. IMPRESSION: 1. Expected evolution of the January lacunar infarct affecting the left corona radiata/basal ganglia. No associated hemorrhage or mass effect. 2. No new intracranial abnormality. Electronically Signed   By: Genevie Ann M.D.   On: 02/10/2020 21:09   DG Chest Port 1 View  Result Date: 02/10/2020 CLINICAL DATA:  Pain. EXAM: PORTABLE CHEST 1 VIEW COMPARISON:  12/12/2019 FINDINGS: The cardiomediastinal contours are normal. Atherosclerosis of the aortic arch. Chronic interstitial coarsening, unchanged. Pulmonary vasculature is normal. No consolidation, pleural effusion, or pneumothorax. No acute osseous abnormalities are seen. IMPRESSION: No acute chest findings. Aortic Atherosclerosis (ICD10-I70.0). Electronically Signed   By: Keith Rake M.D.   On: 02/10/2020 19:33   DG Humerus Right  Result Date: 02/10/2020 CLINICAL DATA:  Right arm pain. EXAM: RIGHT HUMERUS - 2+ VIEW COMPARISON:  None. FINDINGS: Cortical margins of the right humerus are intact. There is no evidence of fracture or other focal bone lesions. Shoulder and elbow alignment are maintained. There is degenerative change of the glenohumeral and elbow joint. Small olecranon spur. Soft tissues are unremarkable. IMPRESSION: No acute bony abnormality of the right humerus. Degenerative  change of the shoulder and elbow. Electronically Signed   By: Keith Rake M.D.   On: 02/10/2020 19:39   EEG adult  Result Date: 02/11/2020 Lora Havens, MD     02/11/2020  3:13 PM Patient Name: [name] MRN: [id number] Epilepsy Attending: Lora Havens Referring Physician/Provider: Dr Milus Banister Date: 02/11/2020 Duration: 23.35 mins Patient history: 74 y.o. female with past medical history of CVA January 2021 (left basal ganglia acute infarct) who presented with right-sided weakness and numbness associated with pain, tensing up. EEG to evaluate for seizure. Level of alertness: awake, asleep AEDs during EEG study: Gabapentin Technical aspects: This EEG study was done with scalp electrodes positioned according to the 10-20 International system of electrode placement. Electrical activity was acquired at a sampling rate of 500Hz and reviewed with a high frequency filter of 70Hz and a low frequency filter of 1Hz. EEG data were recorded continuously and digitally stored. DESCRIPTION: The posterior dominant rhythm consists of 8-9 Hz activity of moderate voltage (25-35 uV) seen predominantly in posterior head regions, symmetric and reactive to eye opening and eye closing. Sleep was characterized by vertex waves, maximal frontocentral. EEG showed intermittent generalized 3-5hz theta-delta slowing. Photic driving was not seen during photic stimulation. Hyperventilation was not performed. ABNORMALITY - Intermittent slow, generalized IMPRESSION: This study is suggestive of mild diffuse encephalopathy, non specific to etiology. No seizures or epileptiform discharges were seen throughout the recording. Priyanka Barbra Sarks   Results/Tests Pending at Time of Discharge: None  Discharge Medications:  Allergies as of 02/11/2020      Reactions   Codeine Nausea Only      Medication List    STOP taking these medications   methocarbamol 500 MG tablet Commonly known as: ROBAXIN     TAKE these  medications   acetaminophen 500 MG tablet Commonly known as: TYLENOL Take 500 mg by mouth as needed (leg pain).   aspirin 81 MG EC tablet Take 1 tablet (81 mg total) by mouth daily.   atorvastatin 40 MG tablet Commonly known as: LIPITOR Take 1 tablet (40 mg total) by mouth daily at 6 PM.  cephALEXin 250 MG capsule Commonly known as: KEFLEX Take 250 mg by mouth daily. For 7 days   clopidogrel 75 MG tablet Commonly known as: PLAVIX Take 1 tablet (75 mg total) by mouth daily.   ergocalciferol 1.25 MG (50000 UT) capsule Commonly known as: VITAMIN D2 Take 50,000 Units by mouth every Sunday.   gabapentin 300 MG capsule Commonly known as: NEURONTIN Take 900 mg by mouth 3 (three) times daily.   gemfibrozil 600 MG tablet Commonly known as: LOPID Take 600 mg by mouth 2 (two) times daily.   levothyroxine 100 MCG tablet Commonly known as: SYNTHROID Take 1 tablet (100 mcg total) by mouth daily before breakfast.   metFORMIN 500 MG tablet Commonly known as: GLUCOPHAGE Take 500 mg by mouth 2 (two) times daily.   naloxone 4 MG/0.1ML Liqd nasal spray kit Commonly known as: NARCAN Use as needed for signs of overdose   oxybutynin 10 MG 24 hr tablet Commonly known as: DITROPAN-XL Take 10 mg by mouth at bedtime.   polyethylene glycol 17 g packet Commonly known as: MiraLax Take 17 g by mouth daily as needed for moderate constipation.   Tyler Aas FlexTouch 100 UNIT/ML FlexTouch Pen Generic drug: insulin degludec Inject 40 Units into the skin daily as needed (Only if sugar is above 200).   vitamin B-12 1000 MCG tablet Commonly known as: CYANOCOBALAMIN Take 1,000 mcg by mouth daily.   Xtampza ER 9 MG C12a Generic drug: oxyCODONE ER Take 9 mg by mouth every 12 (twelve) hours.       Discharge Instructions: Please refer to Patient Instructions section of EMR for full details.  Patient was counseled important signs and symptoms that should prompt return to medical care, changes in  medications, dietary instructions, activity restrictions, and follow up appointments.   Follow-Up Appointments: Follow-up Information    Everardo Beals, NP. Call on 02/13/2020.   Why: to make an appointment for 3/29 or 3/30. Contact information: [place] [address] [phone]        [facility] Follow up.   Specialty: Home Health Services Contact information: [address] [place] [address] [place], DO 02/13/2020, 9:31 AM PGY-2, [place]

## 2020-02-11 NOTE — TOC Transition Note (Signed)
Transition of Care [facility]) - CM/SW Discharge Note   Patient Details  Name: [name] MRN: [id number] Date of Birth: 08/29/1946  [facility] CM/SW Contact:  Bartholomew Crews, RN Phone Number: [phone] 02/11/2020, 5:52 PM   Clinical Narrative:    Spoke with patient and daughter at the bedside. Patient to transition home today. [facility] active with [facility] No TOC needs identified at this time.   Final next level of care: Home w Home Health Services Barriers to Discharge: No Barriers Identified   Patient Goals and CMS Choice   CMS Medicare.gov Compare Post Acute Care list provided to:: Patient Choice offered to / list presented to : Patient, Adult Children  Discharge Placement                       Discharge Plan and Services                DME Arranged: N/A DME Agency: NA       HH Arranged: NA HH Agency: [facility] Date [place] Agency Contacted: 02/11/20 Time [place]: 0312 Representative spoke with at [place]: [place] ([place]) Interventions     Readmission Risk Interventions No flowsheet data found.

## 2020-02-14 ENCOUNTER — Ambulatory Visit: Payer: Medicare HMO | Attending: Internal Medicine

## 2020-02-14 ENCOUNTER — Telehealth: Payer: Self-pay | Admitting: Vascular Surgery

## 2020-02-14 DIAGNOSIS — Z23 Encounter for immunization: Secondary | ICD-10-CM

## 2020-02-14 NOTE — Telephone Encounter (Signed)
I leave notification that the patient was seen in the emergency department and apparently admitted overnight with changes in her neurologic status.  I reviewed that admission.  She did not have any right brain symptoms.  Was felt that this was continued resolution of her earlier stroke.  I called her daughter, [name] this morning and discussed this with her.  Explained that she does have a known severe right carotid stenosis and would recommend eventual endarterectomy but would defer this until she resolves.  Explained that this recent event would make it even less appropriate to proceed urgently.  We will see her as scheduled in approximately 4 to 6 weeks for continued discussion.

## 2020-02-14 NOTE — Progress Notes (Signed)
   Covid-19 Vaccination Clinic  Name:  [name]    MRN: [id number] DOB: 1946-01-20  02/14/2020  [name] was observed post Covid-19 immunization for 15 minutes without incident. She was provided with Vaccine Information Sheet and instruction to access the V-Safe system.   [name] was instructed to call 911 with any severe reactions post vaccine: Marland Kitchen Difficulty breathing  . Swelling of face and throat  . A fast heartbeat  . A bad rash all over body  . Dizziness and weakness   Immunizations Administered    Name Date Dose VIS Date Route   Pfizer COVID-19 Vaccine 02/14/2020 11:47 AM 0.3 mL 10/28/2019 Intramuscular   Manufacturer: [place]   Lot: [id number]   [place]: [id number]

## 2020-02-22 ENCOUNTER — Telehealth (INDEPENDENT_AMBULATORY_CARE_PROVIDER_SITE_OTHER): Payer: Medicare HMO | Admitting: Cardiology

## 2020-02-22 ENCOUNTER — Telehealth: Payer: Self-pay

## 2020-02-22 ENCOUNTER — Encounter: Payer: Self-pay | Admitting: Cardiology

## 2020-02-22 VITALS — Wt 156.0 lb

## 2020-02-22 DIAGNOSIS — E1169 Type 2 diabetes mellitus with other specified complication: Secondary | ICD-10-CM

## 2020-02-22 DIAGNOSIS — Z8616 Personal history of COVID-19: Secondary | ICD-10-CM | POA: Insufficient documentation

## 2020-02-22 DIAGNOSIS — I6523 Occlusion and stenosis of bilateral carotid arteries: Secondary | ICD-10-CM

## 2020-02-22 DIAGNOSIS — I779 Disorder of arteries and arterioles, unspecified: Secondary | ICD-10-CM | POA: Insufficient documentation

## 2020-02-22 DIAGNOSIS — Z8673 Personal history of transient ischemic attack (TIA), and cerebral infarction without residual deficits: Secondary | ICD-10-CM

## 2020-02-22 DIAGNOSIS — E785 Hyperlipidemia, unspecified: Secondary | ICD-10-CM

## 2020-02-22 DIAGNOSIS — E11649 Type 2 diabetes mellitus with hypoglycemia without coma: Secondary | ICD-10-CM

## 2020-02-22 DIAGNOSIS — I10 Essential (primary) hypertension: Secondary | ICD-10-CM

## 2020-02-22 NOTE — Patient Instructions (Signed)
Medication Instructions:  Your physician recommends that you continue on your current medications as directed. Please refer to the Current Medication list given to you today.  *If you need a refill on your cardiac medications before your next appointment, please call your pharmacy*   Lab Work: None ordered this visit  If you have labs (blood work) drawn today and your tests are completely normal, you will receive your results only by: Marland Kitchen MyChart Message (if you have MyChart) OR . A paper copy in the mail If you have any lab test that is abnormal or we need to change your treatment, we will call you to review the results.   Testing/Procedures: None this visit    Follow-Up: At [facility], you and your health needs are our priority.  As part of our continuing mission to provide you with exceptional heart care, we have created designated Provider Care Teams.  These Care Teams include your primary Cardiologist (physician) and Advanced Practice Providers (APPs -  Physician Assistants and Nurse Practitioners) who all work together to provide you with the care you need, when you need it.  We recommend signing up for the patient portal called "MyChart".  Sign up information is provided on this After Visit Summary.  MyChart is used to connect with patients for Virtual Visits (Telemedicine).  Patients are able to view lab/test results, encounter notes, upcoming appointments, etc.  Non-urgent messages can be sent to your provider as well.   To learn more about what you can do with MyChart, go to [email or web address].    Your next appointment:   4 week(s)  The format for your next appointment:   In Person  Provider:   Quay Burow, MD   Other Instructions

## 2020-02-22 NOTE — Progress Notes (Signed)
Virtual Visit via Telephone Note   This visit type was conducted due to national recommendations for restrictions regarding the COVID-19 Pandemic (e.g. social distancing) in an effort to limit this patient's exposure and mitigate transmission in our community.  Due to her co-morbid illnesses, this patient is at least at moderate risk for complications without adequate follow up.  This format is felt to be most appropriate for this patient at this time.  The patient did not have access to video technology/had technical difficulties with video requiring transitioning to audio format only (telephone).  All issues noted in this document were discussed and addressed.  No physical exam could be performed with this format.  Please refer to the patient's chart for her  consent to telehealth for [facility].   The patient was identified using 2 identifiers.  Date:  02/22/2020   ID:  [name], DOB 1946/10/24, MRN [id number]  Patient Location: Home Provider Location: Home  PCP:  Everardo Beals, NP  Cardiologist:  Dr Gwenlyn Found Electrophysiologist:  None   Evaluation Performed:  Follow-Up Visit  Chief Complaint:  Post hospital follow up  History of Present Illness:    [name] is a 74 y.o. female with with a history of hypertension, diabetes, and COPD.  She saw Dr. Gwenlyn Found in 2018 for lower extremity edema.  Echocardiogram was normal, he has not seen her back since.  The patient presented with Covid and a UTI November 29, 2019.  She was hospitalized for 3 days.  She then was readmitted 12/12/2019 with a left brain stroke.  Echocardiogram showed normal LV function.  Her EKG showed sinus rhythm.  Carotid Doppler showed 80% right internal carotid artery narrowing and 40 to 59% left internal carotid artery narrowing.  She was discharged and did follow-up with Dr. Donnetta Hutching 01/10/2020.  He is recommended a right carotid endarterectomy but would like to defer this for several months until she is fully  recovered.   She was admitted again February 10, 2020 with arm pain which was ultimately felt to be post stroke pain.  She does have a history of chronic pain syndrome.  At discharge she was given this office visit for follow-up.  Since discharge she is actually done pretty well.  She denies any unusual shortness of breath or chest pain.  She seems to be recovering.  The patient's husband also had Covid in January 2021 and was also admitted in March 2021.  They were both given appointments today to see me virtually as a post hospital follow-up.  His note will follow this one.  The patient does not have symptoms concerning for COVID-19 infection (fever, chills, cough, or new shortness of breath).    Past Medical History:  Diagnosis Date  . Arthritis   . Basal ganglia stroke ([place])   . Bilateral lower extremity edema   . Flank pain   . Full dentures   . History of cervical cancer    11/ 1994  Stage IB  s/p  high dose radiation brachytherapy @ [facility] 01/ 1995--- per pt no recurrence  . History of chronic bronchitis   . History of hyperthyroidism    d 03/ 2011---due to grave's disease--- s/p  RAI treatment 04/ 2011  . History of sepsis 05/03/2018   due to UTI with klebsiella/ pyelonephritis/ ureteral obstruction cause by stone  . Hyperlipidemia   . Hypothyroidism, postradioiodine therapy    endocrinologist--  dr Loanne Drilling--  dx graves disease and s/p RAI i131 treatement 4/  2011  . Nauseated   . Oral thrush 05/03/2018  . Pneumonia   . Type 2 diabetes mellitus treated with insulin ([place])    FOLLOWED BY PCP  . Urgency of urination   . Wears glasses    Past Surgical History:  Procedure Laterality Date  . APPENDECTOMY    . CATARACT EXTRACTION W/ INTRAOCULAR LENS  IMPLANT, BILATERAL  2004  approx.  . CYSTOSCOPY WITH STENT PLACEMENT Right 05/04/2018   Procedure: CYSTOSCOPY WITH STENT PLACEMENT AND RETROGRADE PYELOGRAM;  Surgeon: Ceasar Mons, MD;  Location: [facility];  Service: Urology;   Laterality: Right;  . CYSTOSCOPY/URETEROSCOPY/HOLMIUM LASER/STENT PLACEMENT Right 05/19/2018   Procedure: CYSTOSCOPY, URETEROSCOPY/HOLMIUM LASER, STONE BASKETRY/ STENT EXCHANGE;  Surgeon: Ceasar Mons, MD;  Location: [facility];  Service: Urology;  Laterality: Right;  . EXCISION MASS LEFT CHEST WALL  09-20-2007   dr Grandville Silos  [facility]  . Radioactive Iodine Therapy     for thyroid  . REVISION TOTAL HIP ARTHROPLASTY Left early 2000s  . TANDEM RING INSERTION  1995   dr Aldean Ast @ [facility]   EUA w/ tandem placement in ovid  (for direct high dose radiation brachytherapy , cervical cancer)  . TOTAL HIP ARTHROPLASTY Left 1990s  . TRANSTHORACIC ECHOCARDIOGRAM  08/07/2017   mild focal basal hypertrophy of the septum,  ef 92-42%, grade 1 diastolic dysfunction/  atrial septum with lipomatous hypertrophy/  trivial PR     Current Meds  Medication Sig  . acetaminophen (TYLENOL) 500 MG tablet Take 500 mg by mouth as needed (leg pain).   Marland Kitchen aspirin EC 81 MG EC tablet Take 1 tablet (81 mg total) by mouth daily.  Marland Kitchen atorvastatin (LIPITOR) 40 MG tablet Take 1 tablet (40 mg total) by mouth daily at 6 PM.  . clopidogrel (PLAVIX) 75 MG tablet Take 1 tablet (75 mg total) by mouth daily.  . ergocalciferol (VITAMIN D2) 50000 UNITS capsule Take 50,000 Units by mouth every Sunday.   . gabapentin (NEURONTIN) 300 MG capsule Take 900 mg by mouth 3 (three) times daily.   Marland Kitchen gemfibrozil (LOPID) 600 MG tablet Take 600 mg by mouth 2 (two) times daily.    . insulin degludec (TRESIBA FLEXTOUCH) 100 UNIT/ML FlexTouch Pen Inject 40 Units into the skin daily as needed (Only if sugar is above 200).  Marland Kitchen levothyroxine (SYNTHROID) 100 MCG tablet Take 1 tablet (100 mcg total) by mouth daily before breakfast.  . metFORMIN (GLUCOPHAGE) 500 MG tablet Take 500 mg by mouth 2 (two) times daily.  . naloxone (NARCAN) nasal spray 4 mg/0.1 mL Use as needed for signs of overdose  . oxybutynin (DITROPAN-XL) 10 MG 24 hr  tablet Take 10 mg by mouth at bedtime.   Marland Kitchen oxyCODONE ER (XTAMPZA ER) 9 MG C12A Take 9 mg by mouth every 12 (twelve) hours.  . polyethylene glycol (MIRALAX) 17 g packet Take 17 g by mouth daily as needed for moderate constipation.  . vitamin B-12 (CYANOCOBALAMIN) 1000 MCG tablet Take 1,000 mcg by mouth daily.     Allergies:   Codeine   Social History   Tobacco Use  . Smoking status: Former Smoker    Years: 5.00    Types: Cigarettes    Quit date: 05/13/1982    Years since quitting: 37.8  . Smokeless tobacco: Never Used  Substance Use Topics  . Alcohol use: No  . Drug use: No     Family Hx: The patient's family history includes Cancer in her mother; Diabetes in her father  and mother; Emphysema in her brother, father, and sister.  ROS:   Please see the history of present illness.    All other systems reviewed and are negative.   Prior CV studies:   The following studies were reviewed today:  Echo 12/13/2019- EF 60-65%  Labs/Other Tests and Data Reviewed:    EKG:  An ECG dated 02/11/2020 was personally reviewed today and demonstrated:  NSR-HR 65  Recent Labs: 12/13/2019: Magnesium 1.4 12/14/2019: TSH 0.131 02/11/2020: ALT 9; BUN 9; Creatinine, Ser 0.88; Creatinine, Ser 0.87; Hemoglobin 11.8; Platelets 233; Potassium 4.0; Sodium 145   Recent Lipid Panel Lab Results  Component Value Date/Time   CHOL 117 12/14/2019 04:21 AM   TRIG 79 12/14/2019 04:21 AM   HDL 24 (L) 12/14/2019 04:21 AM   CHOLHDL 4.9 12/14/2019 04:21 AM   LDLCALC 77 12/14/2019 04:21 AM    Wt Readings from Last 3 Encounters:  02/22/20 156 lb (70.8 kg)  01/17/20 159 lb 3.2 oz (72.2 kg)  01/10/20 155 lb 11.2 oz (70.6 kg)     Objective:    Vital Signs:  Wt 156 lb (70.8 kg)   BMI 30.47 kg/m    VITAL SIGNS:  reviewed  ASSESSMENT & PLAN:    CVA- S/p Lt brain CVA 12/12/2019- improving. Discharged on ASA and Plavix, no Neurology f/u recommended.  Carotid disease- 92% RICA, 11-94% LICA stenosis- Dr  Donnetta Hutching is following and plans to do a RCEA when she has fully recovered.   COVID- Pt was admitted 11/29/2019 with UTI and + COVID  HLD- On statin Rx and Lopid  NIDDM- On Glucophage  Post CVA pain- On Neurontin  Plan: F/U in the office with Dr Gwenlyn Found in one month.Pre op clearance for CEA can be addressed then.  Since she was diagnosed with stroke from "small vessel disease" and had no evidence of PAF I did not order a monitor.   COVID-19 Education: The signs and symptoms of COVID-19 were discussed with the patient and how to seek care for testing (follow up with PCP or arrange E-visit).  The importance of social distancing was discussed today.  Time:   Today, I have spent 25 minutes with the patient and reviewing hospital records with telehealth technology discussing the above problems.     Medication Adjustments/Labs and Tests Ordered: Current medicines are reviewed at length with the patient today.  Concerns regarding medicines are outlined above.   Tests Ordered: No orders of the defined types were placed in this encounter.   Medication Changes: No orders of the defined types were placed in this encounter.   Follow Up:  In Person Dr Gwenlyn Found in one month (not an APP)  Signed, Kerin Ransom, PA-C  02/22/2020 11:11 AM    [place]

## 2020-02-22 NOTE — Telephone Encounter (Signed)
  Patient Consent for Virtual Visit      Yes    [name] has provided verbal consent on 02/22/2020 for a virtual visit (video or telephone).   CONSENT FOR VIRTUAL VISIT FOR:  [name]  By participating in this virtual visit I agree to the following:  I hereby voluntarily request, consent and authorize [facility] and its employed or contracted physicians, physician assistants, nurse practitioners or other licensed health care professionals (the Practitioner), to provide me with telemedicine health care services (the "Services") as deemed necessary by the treating Practitioner. I acknowledge and consent to receive the Services by the Practitioner via telemedicine. I understand that the telemedicine visit will involve communicating with the Practitioner through live audiovisual communication technology and the disclosure of certain medical information by electronic transmission. I acknowledge that I have been given the opportunity to request an in-person assessment or other available alternative prior to the telemedicine visit and am voluntarily participating in the telemedicine visit.  I understand that I have the right to withhold or withdraw my consent to the use of telemedicine in the course of my care at any time, without affecting my right to future care or treatment, and that the Practitioner or I may terminate the telemedicine visit at any time. I understand that I have the right to inspect all information obtained and/or recorded in the course of the telemedicine visit and may receive copies of available information for a reasonable fee.  I understand that some of the potential risks of receiving the Services via telemedicine include:  Marland Kitchen Delay or interruption in medical evaluation due to technological equipment failure or disruption; . Information transmitted may not be sufficient (e.g. poor resolution of images) to allow for appropriate medical decision making by the  Practitioner; and/or  . In rare instances, security protocols could fail, causing a breach of personal health information.  Furthermore, I acknowledge that it is my responsibility to provide information about my medical history, conditions and care that is complete and accurate to the best of my ability. I acknowledge that Practitioner's advice, recommendations, and/or decision may be based on factors not within their control, such as incomplete or inaccurate data provided by me or distortions of diagnostic images or specimens that may result from electronic transmissions. I understand that the practice of medicine is not an exact science and that Practitioner makes no warranties or guarantees regarding treatment outcomes. I acknowledge that a copy of this consent can be made available to me via my patient portal ([place]), or I can request a printed copy by calling the office of [facility].    I understand that my insurance will be billed for this visit.   I have read or had this consent read to me. . I understand the contents of this consent, which adequately explains the benefits and risks of the Services being provided via telemedicine.  . I have been provided ample opportunity to ask questions regarding this consent and the Services and have had my questions answered to my satisfaction. . I give my informed consent for the services to be provided through the use of telemedicine in my medical care

## 2020-03-12 ENCOUNTER — Telehealth (HOSPITAL_COMMUNITY): Payer: Self-pay

## 2020-03-12 NOTE — Telephone Encounter (Signed)

## 2020-03-13 ENCOUNTER — Encounter: Payer: Self-pay | Admitting: Vascular Surgery

## 2020-03-13 ENCOUNTER — Ambulatory Visit (INDEPENDENT_AMBULATORY_CARE_PROVIDER_SITE_OTHER): Payer: Medicare HMO | Admitting: Vascular Surgery

## 2020-03-13 ENCOUNTER — Other Ambulatory Visit: Payer: Self-pay

## 2020-03-13 VITALS — BP 144/67 | HR 68 | Temp 97.9°F | Resp 20 | Ht 60.0 in | Wt 156.6 lb

## 2020-03-13 DIAGNOSIS — I6521 Occlusion and stenosis of right carotid artery: Secondary | ICD-10-CM

## 2020-03-13 NOTE — Progress Notes (Signed)
[facility] [place]  Patient name: [name] MRN: [id number] DOB: [birth year] Sex: female  REASON FOR VISIT: Follow-up asymptomatic right carotid stenosis  HPI: [name] is a 74 y.o. female seen in follow-up.  She was seen in consultation in the hospital where she had admission in [place] January for a left brain stroke.  She had a thorough evaluation which revealed high-grade asymptomatic right carotid stenosis.  This was a significant stroke leaving her with some word searching speech difficulty and right-sided weakness.  She looks quite good today.  On my last office visit with her she still had a great deal of weakness and speech difficulty.  This is markedly improved and she is walking with a rolling walker.  Past Medical History:  Diagnosis Date  . Arthritis   . Basal ganglia stroke ([place])   . Bilateral lower extremity edema   . Flank pain   . Full dentures   . History of cervical cancer    11/ 1994  Stage IB  s/p  high dose radiation brachytherapy @ [facility] 01/ 1995--- per pt no recurrence  . History of chronic bronchitis   . History of hyperthyroidism    d 03/ 2011---due to grave's disease--- s/p  RAI treatment 04/ 2011  . History of sepsis 05/03/2018   due to UTI with klebsiella/ pyelonephritis/ ureteral obstruction cause by stone  . Hyperlipidemia   . Hypothyroidism, postradioiodine therapy    endocrinologist--  dr Loanne Drilling--  dx graves disease and s/p RAI i131 treatement 4/ 2011  . Nauseated   . Oral thrush 05/03/2018  . Pneumonia   . Type 2 diabetes mellitus treated with insulin ([place])    FOLLOWED BY PCP  . Urgency of urination   . Wears glasses     Family History  Problem Relation Age of Onset  . Emphysema Father   . Diabetes Father   . Emphysema Sister   . Emphysema Brother   . Cancer Mother        Skin Cancer  . Diabetes Mother     SOCIAL HISTORY: Social History   Tobacco Use  . Smoking  status: Former Smoker    Years: 5.00    Types: Cigarettes    Quit date: 05/13/1982    Years since quitting: 37.8  . Smokeless tobacco: Never Used  Substance Use Topics  . Alcohol use: No    Allergies  Allergen Reactions  . Codeine Nausea Only    Current Outpatient Medications  Medication Sig Dispense Refill  . acetaminophen (TYLENOL) 500 MG tablet Take 500 mg by mouth as needed (leg pain).     Marland Kitchen aspirin EC 81 MG EC tablet Take 1 tablet (81 mg total) by mouth daily. 30 tablet 1  . atorvastatin (LIPITOR) 40 MG tablet Take 1 tablet (40 mg total) by mouth daily at 6 PM. 30 tablet 1  . cephALEXin (KEFLEX) 250 MG capsule Take 250 mg by mouth daily.    . clopidogrel (PLAVIX) 75 MG tablet Take 1 tablet (75 mg total) by mouth daily. 21 tablet 0  . ergocalciferol (VITAMIN D2) 50000 UNITS capsule Take 50,000 Units by mouth every Sunday.     . gabapentin (NEURONTIN) 300 MG capsule Take 900 mg by mouth 3 (three) times daily.     Marland Kitchen gemfibrozil (LOPID) 600 MG tablet Take 600 mg by mouth 2 (two) times daily.      . insulin degludec (TRESIBA FLEXTOUCH) 100 UNIT/ML FlexTouch Pen Inject 40  Units into the skin daily as needed (Only if sugar is above 200).    Marland Kitchen levothyroxine (SYNTHROID) 100 MCG tablet Take 1 tablet (100 mcg total) by mouth daily before breakfast. 30 tablet 1  . metFORMIN (GLUCOPHAGE) 500 MG tablet Take 500 mg by mouth 2 (two) times daily.    . methocarbamol (ROBAXIN) 500 MG tablet Take 500 mg by mouth 2 (two) times daily as needed.    . naloxone (NARCAN) nasal spray 4 mg/0.1 mL Use as needed for signs of overdose 1 each 0  . oxybutynin (DITROPAN-XL) 10 MG 24 hr tablet Take 10 mg by mouth at bedtime.     Marland Kitchen oxyCODONE ER (XTAMPZA ER) 9 MG C12A Take 9 mg by mouth every 12 (twelve) hours.    . polyethylene glycol (MIRALAX) 17 g packet Take 17 g by mouth daily as needed for moderate constipation. 14 each 0  . vitamin B-12 (CYANOCOBALAMIN) 1000 MCG tablet Take 1,000 mcg by mouth daily.     No  current facility-administered medications for this visit.    REVIEW OF SYSTEMS:  [X]  denotes positive finding, [ ]  denotes negative finding Cardiac  Comments:  Chest pain or chest pressure:    Shortness of breath upon exertion:    Short of breath when lying flat:    Irregular heart rhythm:        Vascular    Pain in calf, thigh, or hip brought on by ambulation:    Pain in feet at night that wakes you up from your sleep:     Blood clot in your veins:    Leg swelling:           PHYSICAL EXAM: Vitals:   03/13/20 1346 03/13/20 1348  BP: (!) 139/57 (!) 144/67  Pulse: 68   Resp: 20   Temp: 97.9 F (36.6 C)   SpO2: 94%   Weight: 156 lb 9.6 oz (71 kg)   Height: 5' (1.524 m)     GENERAL: The patient is a well-nourished female, in no acute distress. The vital signs are documented above. CARDIOVASCULAR: 2+ radial pulses bilaterally.  Diminished strength in her right arm versus her left PULMONARY: There is good air exchange  MUSCULOSKELETAL: There are no major deformities or cyanosis. NEUROLOGIC: No focal weakness or paresthesias are detected. SKIN: There are no ulcers or rashes noted. PSYCHIATRIC: The patient has a normal affect.  DATA:  CT scan reveals a high-grade 80% right internal carotid artery stenosis at the bifurcation with normal carotid above this.  MEDICAL ISSUES: I would recommend right carotid endarterectomy for severe asymptomatic disease.  I had recommended deferment as she was making nice stroke recovery.  She has been discharged from speech therapy and physical therapy.  She does have an appointment with Dr.Berry for cardiology follow-up in 2 weeks.  We will plan endarterectomy following this clearance.  I explained the procedure with an expected overnight hospitalization and a 1 to 1-1/2% risk of stroke with surgery.    Rosetta Posner, MD FACS [facility] Tel [phone] Pager [phone]

## 2020-03-13 NOTE — H&P (View-Only) (Signed)
[facility]in Specialist of [place]  Patient name: [name] MRN: [id number] DOB: July 10, 1946 Sex: female  REASON FOR VISIT: Follow-up asymptomatic right carotid stenosis  HPI: [name] is a 74 y.o. female seen in follow-up.  She was seen in consultation in the hospital where she had admission in [place] January for a left brain stroke.  She had a thorough evaluation which revealed high-grade asymptomatic right carotid stenosis.  This was a significant stroke leaving her with some word searching speech difficulty and right-sided weakness.  She looks quite good today.  On my last office visit with her she still had a great deal of weakness and speech difficulty.  This is markedly improved and she is walking with a rolling walker.  Past Medical History:  Diagnosis Date  . Arthritis   . Basal ganglia stroke ([place])   . Bilateral lower extremity edema   . Flank pain   . Full dentures   . History of cervical cancer    11/ 1994  Stage IB  s/p  high dose radiation brachytherapy @ [facility] 01/ 1995--- per pt no recurrence  . History of chronic bronchitis   . History of hyperthyroidism    d 03/ 2011---due to grave's disease--- s/p  RAI treatment 04/ 2011  . History of sepsis 05/03/2018   due to UTI with klebsiella/ pyelonephritis/ ureteral obstruction cause by stone  . Hyperlipidemia   . Hypothyroidism, postradioiodine therapy    endocrinologist--  dr Loanne Drilling--  dx graves disease and s/p RAI i131 treatement 4/ 2011  . Nauseated   . Oral thrush 05/03/2018  . Pneumonia   . Type 2 diabetes mellitus treated with insulin ([place])    FOLLOWED BY PCP  . Urgency of urination   . Wears glasses     Family History  Problem Relation Age of Onset  . Emphysema Father   . Diabetes Father   . Emphysema Sister   . Emphysema Brother   . Cancer Mother        Skin Cancer  . Diabetes Mother     SOCIAL HISTORY: Social History   Tobacco Use  . Smoking  status: Former Smoker    Years: 5.00    Types: Cigarettes    Quit date: 05/13/1982    Years since quitting: 37.8  . Smokeless tobacco: Never Used  Substance Use Topics  . Alcohol use: No    Allergies  Allergen Reactions  . Codeine Nausea Only    Current Outpatient Medications  Medication Sig Dispense Refill  . acetaminophen (TYLENOL) 500 MG tablet Take 500 mg by mouth as needed (leg pain).     Marland Kitchen aspirin EC 81 MG EC tablet Take 1 tablet (81 mg total) by mouth daily. 30 tablet 1  . atorvastatin (LIPITOR) 40 MG tablet Take 1 tablet (40 mg total) by mouth daily at 6 PM. 30 tablet 1  . cephALEXin (KEFLEX) 250 MG capsule Take 250 mg by mouth daily.    . clopidogrel (PLAVIX) 75 MG tablet Take 1 tablet (75 mg total) by mouth daily. 21 tablet 0  . ergocalciferol (VITAMIN D2) 50000 UNITS capsule Take 50,000 Units by mouth every Sunday.     . gabapentin (NEURONTIN) 300 MG capsule Take 900 mg by mouth 3 (three) times daily.     Marland Kitchen gemfibrozil (LOPID) 600 MG tablet Take 600 mg by mouth 2 (two) times daily.      . insulin degludec (TRESIBA FLEXTOUCH) 100 UNIT/ML FlexTouch Pen Inject 40  Units into the skin daily as needed (Only if sugar is above 200).    Marland Kitchen levothyroxine (SYNTHROID) 100 MCG tablet Take 1 tablet (100 mcg total) by mouth daily before breakfast. 30 tablet 1  . metFORMIN (GLUCOPHAGE) 500 MG tablet Take 500 mg by mouth 2 (two) times daily.    . methocarbamol (ROBAXIN) 500 MG tablet Take 500 mg by mouth 2 (two) times daily as needed.    . naloxone (NARCAN) nasal spray 4 mg/0.1 mL Use as needed for signs of overdose 1 each 0  . oxybutynin (DITROPAN-XL) 10 MG 24 hr tablet Take 10 mg by mouth at bedtime.     Marland Kitchen oxyCODONE ER (XTAMPZA ER) 9 MG C12A Take 9 mg by mouth every 12 (twelve) hours.    . polyethylene glycol (MIRALAX) 17 g packet Take 17 g by mouth daily as needed for moderate constipation. 14 each 0  . vitamin B-12 (CYANOCOBALAMIN) 1000 MCG tablet Take 1,000 mcg by mouth daily.     No  current facility-administered medications for this visit.    REVIEW OF SYSTEMS:  [X]  denotes positive finding, [ ]  denotes negative finding Cardiac  Comments:  Chest pain or chest pressure:    Shortness of breath upon exertion:    Short of breath when lying flat:    Irregular heart rhythm:        Vascular    Pain in calf, thigh, or hip brought on by ambulation:    Pain in feet at night that wakes you up from your sleep:     Blood clot in your veins:    Leg swelling:           PHYSICAL EXAM: Vitals:   03/13/20 1346 03/13/20 1348  BP: (!) 139/57 (!) 144/67  Pulse: 68   Resp: 20   Temp: 97.9 F (36.6 C)   SpO2: 94%   Weight: 156 lb 9.6 oz (71 kg)   Height: 5' (1.524 m)     GENERAL: The patient is a well-nourished female, in no acute distress. The vital signs are documented above. CARDIOVASCULAR: 2+ radial pulses bilaterally.  Diminished strength in her right arm versus her left PULMONARY: There is good air exchange  MUSCULOSKELETAL: There are no major deformities or cyanosis. NEUROLOGIC: No focal weakness or paresthesias are detected. SKIN: There are no ulcers or rashes noted. PSYCHIATRIC: The patient has a normal affect.  DATA:  CT scan reveals a high-grade 80% right internal carotid artery stenosis at the bifurcation with normal carotid above this.  MEDICAL ISSUES: I would recommend right carotid endarterectomy for severe asymptomatic disease.  I had recommended deferment as she was making nice stroke recovery.  She has been discharged from speech therapy and physical therapy.  She does have an appointment with Dr.Berry for cardiology follow-up in 2 weeks.  We will plan endarterectomy following this clearance.  I explained the procedure with an expected overnight hospitalization and a 1 to 1-1/2% risk of stroke with surgery.    Rosetta Posner, MD FACS [facility] Tel [phone] Pager [phone]

## 2020-03-21 ENCOUNTER — Encounter: Payer: Self-pay | Admitting: Cardiovascular Disease

## 2020-03-21 ENCOUNTER — Encounter: Payer: Self-pay | Admitting: *Deleted

## 2020-03-21 ENCOUNTER — Ambulatory Visit: Payer: Medicare HMO | Admitting: Cardiovascular Disease

## 2020-03-21 ENCOUNTER — Other Ambulatory Visit: Payer: Self-pay

## 2020-03-21 VITALS — BP 176/82 | HR 84 | Ht 60.0 in | Wt 156.6 lb

## 2020-03-21 DIAGNOSIS — E1169 Type 2 diabetes mellitus with other specified complication: Secondary | ICD-10-CM

## 2020-03-21 DIAGNOSIS — Z87891 Personal history of nicotine dependence: Secondary | ICD-10-CM

## 2020-03-21 DIAGNOSIS — I6523 Occlusion and stenosis of bilateral carotid arteries: Secondary | ICD-10-CM | POA: Diagnosis not present

## 2020-03-21 DIAGNOSIS — Z8673 Personal history of transient ischemic attack (TIA), and cerebral infarction without residual deficits: Secondary | ICD-10-CM

## 2020-03-21 DIAGNOSIS — I10 Essential (primary) hypertension: Secondary | ICD-10-CM

## 2020-03-21 DIAGNOSIS — E785 Hyperlipidemia, unspecified: Secondary | ICD-10-CM

## 2020-03-21 DIAGNOSIS — Z01818 Encounter for other preprocedural examination: Secondary | ICD-10-CM

## 2020-03-21 NOTE — Progress Notes (Signed)
03/21/2020 [name]   19-Apr-1946  [id number]  Primary Physician Everardo Beals, NP Primary Cardiologist: [name] MD [name], [place], [place]  HPI:  [name] is a 74 y.o.  married, mother of 2 mother 3 grandchildren who I last saw in the office 07/31/2017.  Her husband [name] is also patient of mine. She was referred to me by [name] Ramming PA-C for evaluation of an abnormal EKG and lower extremity edema. She has a history of diabetes and hyperlipidemia. She smoked for 40 years and quit 10 years ago. She's never had a heart attack or stroke. There is no family history. She's had lower extremity edema the last month left greater than right as well as some pain in both legs although she has had hip and knee replacements.  She was placed on hydrochlorothiazide which resulted in improvement in her edema.  Since I saw her 3 years ago she has had a stroke.  She has high-grade right ICA stenosis and is scheduled for endarterectomy next week by Dr. Donnetta [name].  She is currently walking with a walker.  She denies chest pain or shortness of breath.  She did have a 2D echocardiogram performed 12/13/2019 revealing normal LV systolic function without valvular abnormalities.    No outpatient medications have been marked as taking for the 03/21/20 encounter (Office Visit) with [name], MD.     Allergies  Allergen Reactions  . Codeine Nausea Only    Social History   Socioeconomic History  . Marital status: Married    Spouse name: Not on file  . Number of children: 2  . Years of education: Not on file  . Highest education level: Not on file  Occupational History  . Occupation: Disabled  Tobacco Use  . Smoking status: Former Smoker    Years: 5.00    Types: Cigarettes    Quit date: 05/13/1982    Years since quitting: 37.8  . Smokeless tobacco: Never Used  Substance and Sexual Activity  . Alcohol use: No  . Drug use: No  . Sexual activity: Not Currently  Other  Topics Concern  . Not on file  Social History Narrative   Married and lives with husband   Has children   Housewife   disabled   Social Determinants of Health   Financial Resource Strain:   . Difficulty of Paying Living Expenses:   Food Insecurity:   . Worried About Charity fundraiser in the Last Year:   . Arboriculturist in the Last Year:   Transportation Needs:   . Film/video editor (Medical):   Marland Kitchen Lack of Transportation (Non-Medical):   Physical Activity:   . Days of Exercise per Week:   . Minutes of Exercise per Session:   Stress:   . Feeling of Stress :   Social Connections:   . Frequency of Communication with Friends and Family:   . Frequency of Social Gatherings with Friends and Family:   . Attends Religious Services:   . Active Member of Clubs or Organizations:   . Attends Archivist Meetings:   Marland Kitchen Marital Status:   Intimate Partner Violence:   . Fear of Current or Ex-Partner:   . Emotionally Abused:   Marland Kitchen Physically Abused:   . Sexually Abused:      Review of Systems: General: negative for chills, fever, night sweats or weight changes.  Cardiovascular: negative for chest pain, dyspnea on exertion, edema, orthopnea, palpitations,  paroxysmal nocturnal dyspnea or shortness of breath Dermatological: negative for rash Respiratory: negative for cough or wheezing Urologic: negative for hematuria Abdominal: negative for nausea, vomiting, diarrhea, bright red blood per rectum, melena, or hematemesis Neurologic: negative for visual changes, syncope, or dizziness All other systems reviewed and are otherwise negative except as noted above.    Blood pressure (!) 176/82, pulse 84, height 5' (1.524 m), weight 156 lb 9.6 oz (71 kg), SpO2 98 %.  General appearance: alert and no distress Neck: no adenopathy, no JVD, supple, symmetrical, trachea midline, thyroid not enlarged, symmetric, no tenderness/mass/nodules and Soft right carotid bruit Lungs: clear to  auscultation bilaterally Heart: Soft outflow tract murmur Extremities: extremities normal, atraumatic, no cyanosis or edema Pulses: 2+ and symmetric Skin: Skin color, texture, turgor normal. No rashes or lesions Neurologic: Alert and oriented X 3, normal strength and tone. Normal symmetric reflexes. Normal coordination and gait  EKG sinus rhythm at eighty-four with nonspecific ST and T wave changes.  I personally reviewed this EKG.  ASSESSMENT AND PLAN:   Carotid artery disease (HCC) Recent stroke, scheduled for right carotid endarterectomy next week by Dr. Donnetta [name]  Hyperlipidemia associated with type 2 diabetes mellitus ([place]) History of hyperlipidemia on statin therapy with lipid profile performed 12/14/2019 revealing total cholesterol 117, LDL seventy-seven and HDL of twenty-four.  TOBACCO ABUSE-HISTORY OF History of discontinued tobacco abuse over 10 years ago having smoked at least 40 pack years.  Essential hypertension History of essential hypertension with blood pressure measured today 176/82.  She is on no antihypertensive medications currently but was on Micardis hydrochlorothiazide when I saw her 3 years ago.  Preoperative clearance Ms. [name] is scheduled for elective right carotid endarterectomy next week by Dr. Donnetta [name].  She does have a history of remote tobacco abuse, diabetes, hypertension and hyperlipidemia.  She does have normal LV function by 2D echo.  I am going to order a Lexiscan Myoview stress test to risk stratify her prior to her surgery.      [name] MD FACP,FACC,FAHA, [facility] 03/21/2020 2:33 PM

## 2020-03-21 NOTE — Assessment & Plan Note (Signed)
History of hyperlipidemia on statin therapy with lipid profile performed 12/14/2019 revealing total cholesterol 117, LDL seventy-seven and HDL of twenty-four.

## 2020-03-21 NOTE — Assessment & Plan Note (Signed)
History of discontinued tobacco abuse over 10 years ago having smoked at least 40 pack years.

## 2020-03-21 NOTE — Assessment & Plan Note (Signed)
History of essential hypertension with blood pressure measured today 176/82.  She is on no antihypertensive medications currently but was on Micardis hydrochlorothiazide when I saw her 3 years ago.

## 2020-03-21 NOTE — Assessment & Plan Note (Signed)
[name] is scheduled for elective right carotid endarterectomy next week by Dr. Donnetta Hutching.  She does have a history of remote tobacco abuse, diabetes, hypertension and hyperlipidemia.  She does have normal LV function by 2D echo.  I am going to order a Lexiscan Myoview stress test to risk stratify her prior to her surgery.

## 2020-03-21 NOTE — Assessment & Plan Note (Signed)
Recent stroke, scheduled for right carotid endarterectomy next week by Dr. Donnetta Hutching

## 2020-03-21 NOTE — Patient Instructions (Signed)
Medication Instructions:  NO CHANGE *If you need a refill on your cardiac medications before your next appointment, please call your pharmacy*   Lab Work: If you have labs (blood work) drawn today and your tests are completely normal, you will receive your results only by: Marland Kitchen MyChart Message (if you have MyChart) OR . A paper copy in the mail If you have any lab test that is abnormal or we need to change your treatment, we will call you to review the results.   Testing/Procedures: Your physician has requested that you have a lexiscan myoview. For further information please visit [email or web address]. Please follow instruction sheet, as given.   Follow-Up: At [facility], you and your health needs are our priority.  As part of our continuing mission to provide you with exceptional heart care, we have created designated Provider Care Teams.  These Care Teams include your primary Cardiologist (physician) and Advanced Practice Providers (APPs -  Physician Assistants and Nurse Practitioners) who all work together to provide you with the care you need, when you need it.  We recommend signing up for the patient portal called "MyChart".  Sign up information is provided on this After Visit Summary.  MyChart is used to connect with patients for Virtual Visits (Telemedicine).  Patients are able to view lab/test results, encounter notes, upcoming appointments, etc.  Non-urgent messages can be sent to your provider as well.   To learn more about what you can do with MyChart, go to [email or web address].    Your next appointment:   12 month(s)  The format for your next appointment:   Either In Person or Virtual  Provider:   You may see Quay Burow MD or one of the following Advanced Practice Providers on your designated Care Team:    Kerin Ransom, PA-C  [place], [place]  Coletta Memos, [place]

## 2020-03-23 ENCOUNTER — Ambulatory Visit (HOSPITAL_COMMUNITY)
Admission: RE | Admit: 2020-03-23 | Discharge: 2020-03-23 | Disposition: A | Discharge status: Home / Self Care | Payer: Medicare HMO | Source: Ambulatory Visit | Attending: Cardiology | Admitting: Cardiology

## 2020-03-23 ENCOUNTER — Other Ambulatory Visit: Payer: Self-pay

## 2020-03-23 ENCOUNTER — Telehealth: Payer: Self-pay | Admitting: *Deleted

## 2020-03-23 DIAGNOSIS — Z01810 Encounter for preprocedural cardiovascular examination: Secondary | ICD-10-CM | POA: Diagnosis not present

## 2020-03-23 DIAGNOSIS — I6523 Occlusion and stenosis of bilateral carotid arteries: Secondary | ICD-10-CM | POA: Diagnosis present

## 2020-03-23 LAB — MYOCARDIAL PERFUSION IMAGING
LV dias vol: 64 mL (ref 46–106)
LV sys vol: 24 mL
Peak HR: 82 {beats}/min
Rest HR: 64 {beats}/min
SDS: 1
SRS: 0
SSS: 1
TID: 1.14

## 2020-03-23 MED ORDER — TECHNETIUM TC 99M TETROFOSMIN IV KIT
9.7000 | PACK | Freq: Once | INTRAVENOUS | Status: AC | PRN
  Administered 2020-03-23: 9.7 via INTRAVENOUS
  Filled 2020-03-23: qty 10

## 2020-03-23 MED ORDER — ADENOSINE (DIAGNOSTIC) 3 MG/ML IV SOLN
0.5600 mg/kg | Freq: Once | INTRAVENOUS | Status: AC
  Administered 2020-03-23: 39.6 mg via INTRAVENOUS

## 2020-03-23 MED ORDER — TECHNETIUM TC 99M TETROFOSMIN IV KIT
30.7000 | PACK | Freq: Once | INTRAVENOUS | Status: AC | PRN
  Administered 2020-03-23: 30.7 via INTRAVENOUS
  Filled 2020-03-23: qty 31

## 2020-03-23 NOTE — Telephone Encounter (Signed)
Patient here for stress test and brought by her medication list. Medication list updated and will send to dr berry to review and make any changes.

## 2020-03-26 NOTE — Progress Notes (Signed)
Ms Stay' daughter called the desk to see what time Ms. Kniskern appointments are tomorrow. I called her back and she asked if I could hold on that she was speaking to the pharmacy, I said no, before I could say call me back she hung up.  She called back and said she was trying to get medications straight and fix her medication box.; they answered my questions, so I took the Aspirin and Plavix out, pharmacy confirmed that she should hold blood thinners. I told patient's daughter that the Dr. is the on to decide about blood thinners and give instructions on when to hold.  I explained that I know that patient's Dr.  does not hold Aspirin 81 mg, she said "I always have to hold mine before surgery." I instructed patient's  daughter to call the office to ask about the blood thinners, "I have already 2 times today and no one has called me back. " I we said I know it is very frustrating but that some offices make calls after clinic time. I was able to answered questions regarding PAT appointment and Covid appointment tomorrow.

## 2020-03-26 NOTE — Progress Notes (Signed)
[place], [place] - [id number] [place] [address] Phone: [phone] Fax: [phone]    Your procedure is scheduled on Wednesday, May 12th.  Report to [facility] Main Entrance "A" at 6:30 A.M., and check in at the Admitting office.  Call this number if you have problems the morning of surgery:  [phone]  Call [phone] if you have any questions prior to your surgery date Monday-Friday 8am-4pm   Remember:  Do not eat or drink after midnight the night before your surgery   Take these medicines the morning of surgery with A SIP OF WATER      gabapentin (NEURONTIN) gemfibrozil (LOPID)  levothyroxine (SYNTHROID) methocarbamol (ROBAXIN)  oxyCODONE ER (XTAMPZA ER)   Follow your surgeon's instructions on when to stop Aspirin and Plavix.  If no instructions were given by your surgeon then you will need to call the office to get those instructions.    As of today, STOP taking Aleve, Naproxen, Ibuprofen, Motrin, Advil, Goody's, BC's, all herbal medications, fish oil, and all vitamins.          WHAT DO I DO ABOUT MY DIABETES MEDICATION?  - The night before sugery/bedtime dose - Take 20 units of insulin degludec (TRESIBA FLEXTOUCH)   - The morning of surgery Do NOT take metformin (Glucophage)  HOW TO MANAGE YOUR DIABETES BEFORE AND AFTER SURGERY  Why is it important to control my blood sugar before and after surgery? . Improving blood sugar levels before and after surgery helps healing and can limit problems. . A way of improving blood sugar control is eating a healthy diet by: o  Eating less sugar and carbohydrates o  Increasing activity/exercise o  Talking with your doctor about reaching your blood sugar goals . High blood sugars (greater than 180 mg/dL) can raise your risk of infections and slow your recovery, so you will need to focus on controlling your diabetes during the weeks before surgery. . Make sure  that the doctor who takes care of your diabetes knows about your planned surgery including the date and location.  How do I manage my blood sugar before surgery? . Check your blood sugar at least 4 times a day, starting 2 days before surgery, to make sure that the level is not too high or low. . Check your blood sugar the morning of your surgery when you wake up and every 2 hours until you get to the Short Stay unit. o If your blood sugar is less than 70 mg/dL, you will need to treat for low blood sugar: - Do not take insulin. - Treat a low blood sugar (less than 70 mg/dL) with  cup of clear juice (cranberry or apple), 4 glucose tablets, OR glucose gel. - Recheck blood sugar in 15 minutes after treatment (to make sure it is greater than 70 mg/dL). If your blood sugar is not greater than 70 mg/dL on recheck, call [phone] for further instructions. . Report your blood sugar to the short stay nurse when you get to Short Stay.  . If you are admitted to the hospital after surgery: o Your blood sugar will be checked by the staff and you will probably be given insulin after surgery (instead of oral diabetes medicines) to make sure you have good blood sugar levels. o The goal for blood sugar control after surgery is 80-180 mg/dL.             Do not wear jewelry, make up, or  nail polish            Do not wear lotions, powders, perfumes, or deodorant.            Do not shave 48 hours prior to surgery.              Do not bring valuables to the hospital.            [facility] is not responsible for any belongings or valuables.  Do NOT Smoke (Tobacco/Vapping) or drink Alcohol 24 hours prior to your procedure If you use a CPAP at night, you may bring all equipment for your overnight stay.   Contacts, glasses, dentures or bridgework may not be worn into surgery.      For patients admitted to the hospital, discharge time will be determined by your treatment team.   Patients discharged the day of  surgery will not be allowed to drive home, and someone needs to stay with them for 24 hours.  Special instructions:   [place]- Preparing For Surgery  Before surgery, you can play an important role. Because skin is not sterile, your skin needs to be as free of germs as possible. You can reduce the number of germs on your skin by washing with CHG (chlorahexidine gluconate) Soap before surgery.  CHG is an antiseptic cleaner which kills germs and bonds with the skin to continue killing germs even after washing.    Oral Hygiene is also important to reduce your risk of infection.  Remember - BRUSH YOUR TEETH THE MORNING OF SURGERY WITH YOUR REGULAR TOOTHPASTE  Please do not use if you have an allergy to CHG or antibacterial soaps. If your skin becomes reddened/irritated stop using the CHG.  Do not shave (including legs and underarms) for at least 48 hours prior to first CHG shower. It is OK to shave your face.  Please follow these instructions carefully.   1. Shower the NIGHT BEFORE SURGERY and the MORNING OF SURGERY with CHG Soap.   2. If you chose to wash your hair, wash your hair first as usual with your normal shampoo.  3. After you shampoo, rinse your hair and body thoroughly to remove the shampoo.  4. Use CHG as you would any other liquid soap. You can apply CHG directly to the skin and wash gently with a scrungie or a clean washcloth.   5. Apply the CHG Soap to your body ONLY FROM THE NECK DOWN.  Do not use on open wounds or open sores. Avoid contact with your eyes, ears, mouth and genitals (private parts). Wash Face and genitals (private parts)  with your normal soap.   6. Wash thoroughly, paying special attention to the area where your surgery will be performed.  7. Thoroughly rinse your body with warm water from the neck down.  8. DO NOT shower/wash with your normal soap after using and rinsing off the CHG Soap.  9. Pat yourself dry with a CLEAN TOWEL.  10. Wear CLEAN PAJAMAS  to bed the night before surgery, wear comfortable clothes the morning of surgery  11. Place CLEAN SHEETS on your bed the night of your first shower and DO NOT SLEEP WITH PETS.  Day of Surgery: Shower with CHG soap as instructed above.  Do not apply any deodorants/lotions.  Please wear clean clothes to the hospital/surgery center.   Remember to brush your teeth WITH YOUR REGULAR TOOTHPASTE.   Please read over the following fact sheets that you were given.

## 2020-03-27 ENCOUNTER — Other Ambulatory Visit: Payer: Self-pay

## 2020-03-27 ENCOUNTER — Encounter (HOSPITAL_COMMUNITY)
Admission: RE | Admit: 2020-03-27 | Discharge: 2020-03-27 | Disposition: A | Discharge status: Home / Self Care | Payer: Medicare HMO | Source: Ambulatory Visit | Attending: Vascular Surgery | Admitting: Vascular Surgery

## 2020-03-27 ENCOUNTER — Other Ambulatory Visit (HOSPITAL_COMMUNITY)
Admission: RE | Admit: 2020-03-27 | Discharge: 2020-03-27 | Disposition: A | Discharge status: Home / Self Care | Payer: Medicare HMO | Source: Ambulatory Visit | Attending: Vascular Surgery | Admitting: Vascular Surgery

## 2020-03-27 ENCOUNTER — Encounter (HOSPITAL_COMMUNITY): Payer: Self-pay

## 2020-03-27 HISTORY — DX: Malignant (primary) neoplasm, unspecified: C80.1

## 2020-03-27 HISTORY — DX: Personal history of urinary calculi: Z87.442

## 2020-03-27 LAB — CBC
HCT: 41.9 % (ref 36.0–46.0)
Hemoglobin: 12.6 g/dL (ref 12.0–15.0)
MCH: 26.5 pg (ref 26.0–34.0)
MCHC: 30.1 g/dL (ref 30.0–36.0)
MCV: 88 fL (ref 80.0–100.0)
Platelets: 275 10*3/uL (ref 150–400)
RBC: 4.76 MIL/uL (ref 3.87–5.11)
RDW: 13.9 % (ref 11.5–15.5)
WBC: 7.2 10*3/uL (ref 4.0–10.5)
nRBC: 0 % (ref 0.0–0.2)

## 2020-03-27 LAB — COMPREHENSIVE METABOLIC PANEL
ALT: 10 U/L (ref 0–44)
AST: 20 U/L (ref 15–41)
Albumin: 3.5 g/dL (ref 3.5–5.0)
Alkaline Phosphatase: 128 U/L — ABNORMAL HIGH (ref 38–126)
Anion gap: 13 (ref 5–15)
BUN: 13 mg/dL (ref 8–23)
CO2: 24 mmol/L (ref 22–32)
Calcium: 8.7 mg/dL — ABNORMAL LOW (ref 8.9–10.3)
Chloride: 105 mmol/L (ref 98–111)
Creatinine, Ser: 0.87 mg/dL (ref 0.44–1.00)
GFR calc Af Amer: >60 mL/min (ref >60)
GFR calc non Af Amer: >60 mL/min (ref >60)
Glucose, Bld: 157 mg/dL — ABNORMAL HIGH (ref 70–99)
Potassium: 3.5 mmol/L (ref 3.5–5.1)
Sodium: 142 mmol/L (ref 135–145)
Total Bilirubin: 0.6 mg/dL (ref 0.3–1.2)
Total Protein: 7 g/dL (ref 6.5–8.1)

## 2020-03-27 LAB — TYPE AND SCREEN
ABO/RH(D): O POS
Antibody Screen: NEGATIVE

## 2020-03-27 LAB — SURGICAL PCR SCREEN
MRSA, PCR: NEGATIVE
Staphylococcus aureus: NEGATIVE

## 2020-03-27 LAB — APTT: aPTT: 38 seconds — ABNORMAL HIGH (ref 24–36)

## 2020-03-27 LAB — HEMOGLOBIN A1C
Hgb A1c MFr Bld: 7.1 % — ABNORMAL HIGH (ref 4.8–5.6)
Mean Plasma Glucose: 157.07 mg/dL

## 2020-03-27 LAB — PROTIME-INR
INR: 1.1 (ref 0.8–1.2)
Prothrombin Time: 13.7 seconds (ref 11.4–15.2)

## 2020-03-27 LAB — ABO/RH: ABO/RH(D): O POS

## 2020-03-27 LAB — GLUCOSE, CAPILLARY: Glucose-Capillary: 142 mg/dL — ABNORMAL HIGH (ref 70–99)

## 2020-03-27 LAB — SARS CORONAVIRUS 2 (TAT 6-24 HRS): SARS Coronavirus 2: NEGATIVE

## 2020-03-27 NOTE — Progress Notes (Signed)
PCP:  Everardo Beals, NP Cardiologist:  Quay Burow, MD  EKG:  03/21/20 CXR:  Denies ECHO: 12/13/19 Stress Test:  03/23/20 Cardiac Cath: Denies  Fasting Blood Sugar-  135-230 Checks Blood Sugar_2__ times a day  Covid test 03/27/20  Anesthesia Review:  Yes, cardiac history (stroke).  Spoke with Larene Beach, RN at Dr. Luther Parody office regarding Plavix and ASA.  Patient has not taken her morning medications today.  Per Dr Donnetta Hutching, do not take Plavix today (5/11) or tomorrow (5/12).  Okay to continue 61m ASA.    Patient denies shortness of breath, fever, cough, and chest pain at PAT appointment.  Patient verbalized understanding of instructions provided today at the PAT appointment.  Patient asked to review instructions at home and day of surgery.

## 2020-03-27 NOTE — Anesthesia Preprocedure Evaluation (Addendum)
Anesthesia Evaluation  Patient identified by MRN, date of birth, ID band Patient awake    Reviewed: Allergy & Precautions, H&P , NPO status , Patient's Chart, lab work & pertinent test results  Airway Mallampati: II   Neck ROM: full    Dental   Pulmonary COPD, former smoker,    breath sounds clear to auscultation       Cardiovascular hypertension, + Peripheral Vascular Disease   Rhythm:regular Rate:Normal     Neuro/Psych Seizures -,  CVA    GI/Hepatic   Endo/Other  diabetes, Type 2Hypothyroidism   Renal/GU stones     Musculoskeletal  (+) Arthritis ,   Abdominal   Peds  Hematology   Anesthesia Other Findings   Reproductive/Obstetrics                            Anesthesia Physical Anesthesia Plan  ASA: III  Anesthesia Plan: General   Post-op Pain Management:    Induction: Intravenous  PONV Risk Score and Plan: 3 and Ondansetron, Dexamethasone and Treatment may vary due to age or medical condition  Airway Management Planned: Oral ETT  Additional Equipment: Arterial line  Intra-op Plan:   Post-operative Plan: Extubation in OR  Informed Consent: I have reviewed the patients History and Physical, chart, labs and discussed the procedure including the risks, benefits and alternatives for the proposed anesthesia with the patient or authorized representative who has indicated his/her understanding and acceptance.       Plan Discussed with: CRNA, Anesthesiologist and Surgeon  Anesthesia Plan Comments: (PAT note by Karoline Caldwell, PA-C:  CVA in January 2021 involving left basal ganglia with residual right-sided hemiparesis.  Also found to have carotid artery stenosis at that time. She was seen by cardiologist Dr. Gwenlyn Found 03/21/20 for preop risk stratification. Per note, "Ms. Bordley is scheduled for elective right carotid endarterectomy next week by Dr. Donnetta Hutching.  She does have a history of remote  tobacco abuse, diabetes, hypertension and hyperlipidemia.  She does have normal LV function by 2D echo.  I am going to order a Lexiscan Myoview stress test to risk stratify her prior to her surgery." Nuclear stress was done 5/7 and was low risk, nonischemic.   Preop labs reviewed, APTT mildly elevated at 38. Otherwise unremarkable. IDDMII, A1c 7.1.  Nuclear stress 03/23/20: Lexiscan: Electrically negative for ischemia Normal perfusion No ischemia LVEF 62% Low risk study   CT Angio neck 02/11/20: IMPRESSION: 1. Chronic infarct left Corona radiata.  No associated hemorrhage. 2. 70% stenosis proximal right internal carotid artery due to calcific plaque. 3. Left carotid artery widely patent with mild atherosclerotic disease in the common carotid on the left. 4. Moderate stenosis proximal left vertebral artery and mild stenosis proximal right vertebral artery 5. Negative for intracranial large vessel occlusion. Mild to moderate stenosis right P1/P2 segment.  TTE 12/13/19: 1. Left ventricular ejection fraction, by visual estimation, is 60 to  65%. The left ventricle has normal function. There is no increased left  ventricular wall thickness.  2. The left ventricle has no regional wall motion abnormalities.  3. Global right ventricle has normal systolc function.The right  ventricular size is normal. no increase in right ventricular wall  thickness.  4. The mitral valve is normal in structure. No evidence of mitral valve  regurgitation. No evidence of mitral stenosis.  5. The tricuspid valve was normal in structure. Tricuspid valve  regurgitation is trivial.  6. Tricuspid valve regurgitation is trivial.  7. No evidence of aortic valve sclerosis or stenosis.  8. The pulmonic valve was normal in structure.  9. Normal pulmonary artery systolic pressure.  10. The inferior vena cava is normal in size with greater than 50%  respiratory variability, suggesting right atrial pressure of 3  mmHg.  11. No significant change from prior study.  12. Prior images reviewed side by side. )       Anesthesia Quick Evaluation

## 2020-03-27 NOTE — Progress Notes (Signed)
Anesthesia Chart Review:  CVA in January 2021 involving left basal ganglia with residual right-sided hemiparesis.  Also found to have carotid artery stenosis at that time. She was seen by cardiologist Dr. Gwenlyn Found 03/21/20 for preop risk stratification. Per note, "[name] is scheduled for elective right carotid endarterectomy next week by Dr. Donnetta Hutching.  She does have a history of remote tobacco abuse, diabetes, hypertension and hyperlipidemia.  She does have normal LV function by 2D echo.  I am going to order a Lexiscan Myoview stress test to risk stratify her prior to her surgery." Nuclear stress was done 5/7 and was low risk, nonischemic.   Preop labs reviewed, APTT mildly elevated at 38. Otherwise unremarkable. IDDMII, A1c 7.1.  Nuclear stress 03/23/20:  Lexiscan: Electrically negative for ischemia  Normal perfusion No ischemia  LVEF 62%  Low risk study   CT Angio neck 02/11/20: IMPRESSION: 1. Chronic infarct left Corona radiata.  No associated hemorrhage. 2. 70% stenosis proximal right internal carotid artery due to calcific plaque. 3. Left carotid artery widely patent with mild atherosclerotic disease in the common carotid on the left. 4. Moderate stenosis proximal left vertebral artery and mild stenosis proximal right vertebral artery 5. Negative for intracranial large vessel occlusion. Mild to moderate stenosis right P1/P2 segment.  TTE 12/13/19: 1. Left ventricular ejection fraction, by visual estimation, is 60 to  65%. The left ventricle has normal function. There is no increased left  ventricular wall thickness.  2. The left ventricle has no regional wall motion abnormalities.  3. Global right ventricle has normal systolc function.The right  ventricular size is normal. no increase in right ventricular wall  thickness.  4. The mitral valve is normal in structure. No evidence of mitral valve  regurgitation. No evidence of mitral stenosis.  5. The tricuspid valve was normal in  structure. Tricuspid valve  regurgitation is trivial.  6. Tricuspid valve regurgitation is trivial.  7. No evidence of aortic valve sclerosis or stenosis.  8. The pulmonic valve was normal in structure.  9. Normal pulmonary artery systolic pressure.  10. The inferior vena cava is normal in size with greater than 50%  respiratory variability, suggesting right atrial pressure of 3 mmHg.  11. No significant change from prior study.  12. Prior images reviewed side by side.    [name] [facility] Short Stay Center/Anesthesiology Phone [phone] 03/27/2020 4:26 PM

## 2020-03-28 ENCOUNTER — Other Ambulatory Visit: Payer: Self-pay

## 2020-03-28 ENCOUNTER — Encounter (HOSPITAL_COMMUNITY): Payer: Self-pay | Admitting: Vascular Surgery

## 2020-03-28 ENCOUNTER — Inpatient Hospital Stay (HOSPITAL_COMMUNITY): Payer: Medicare HMO | Admitting: Anesthesiology

## 2020-03-28 ENCOUNTER — Inpatient Hospital Stay (HOSPITAL_COMMUNITY)
Admission: RE | Admit: 2020-03-28 | Discharge: 2020-03-29 | DRG: 037 | Disposition: A | Discharge status: Home Health | Payer: Medicare HMO | Attending: Vascular Surgery | Admitting: Vascular Surgery

## 2020-03-28 ENCOUNTER — Inpatient Hospital Stay (HOSPITAL_COMMUNITY): Payer: Medicare HMO | Admitting: Physician Assistant

## 2020-03-28 ENCOUNTER — Encounter (HOSPITAL_COMMUNITY)
Admission: RE | Disposition: A | Discharge status: Home / Self Care | Payer: Self-pay | Source: Home / Self Care | Attending: Vascular Surgery

## 2020-03-28 DIAGNOSIS — Z825 Family history of asthma and other chronic lower respiratory diseases: Secondary | ICD-10-CM | POA: Diagnosis not present

## 2020-03-28 DIAGNOSIS — I6521 Occlusion and stenosis of right carotid artery: Secondary | ICD-10-CM | POA: Diagnosis present

## 2020-03-28 DIAGNOSIS — Z79899 Other long term (current) drug therapy: Secondary | ICD-10-CM | POA: Diagnosis not present

## 2020-03-28 DIAGNOSIS — Z8616 Personal history of COVID-19: Secondary | ICD-10-CM

## 2020-03-28 DIAGNOSIS — I69328 Other speech and language deficits following cerebral infarction: Secondary | ICD-10-CM

## 2020-03-28 DIAGNOSIS — R4789 Other speech disturbances: Secondary | ICD-10-CM | POA: Diagnosis present

## 2020-03-28 DIAGNOSIS — E119 Type 2 diabetes mellitus without complications: Secondary | ICD-10-CM | POA: Diagnosis present

## 2020-03-28 DIAGNOSIS — Z794 Long term (current) use of insulin: Secondary | ICD-10-CM | POA: Diagnosis not present

## 2020-03-28 DIAGNOSIS — I69351 Hemiplegia and hemiparesis following cerebral infarction affecting right dominant side: Secondary | ICD-10-CM | POA: Diagnosis not present

## 2020-03-28 DIAGNOSIS — Z87891 Personal history of nicotine dependence: Secondary | ICD-10-CM

## 2020-03-28 DIAGNOSIS — E785 Hyperlipidemia, unspecified: Secondary | ICD-10-CM | POA: Diagnosis present

## 2020-03-28 DIAGNOSIS — Z833 Family history of diabetes mellitus: Secondary | ICD-10-CM

## 2020-03-28 DIAGNOSIS — Z7902 Long term (current) use of antithrombotics/antiplatelets: Secondary | ICD-10-CM

## 2020-03-28 DIAGNOSIS — I639 Cerebral infarction, unspecified: Secondary | ICD-10-CM | POA: Diagnosis not present

## 2020-03-28 DIAGNOSIS — Z79890 Hormone replacement therapy (postmenopausal): Secondary | ICD-10-CM | POA: Diagnosis not present

## 2020-03-28 DIAGNOSIS — Z7982 Long term (current) use of aspirin: Secondary | ICD-10-CM | POA: Diagnosis not present

## 2020-03-28 DIAGNOSIS — Z20822 Contact with and (suspected) exposure to covid-19: Secondary | ICD-10-CM | POA: Diagnosis present

## 2020-03-28 DIAGNOSIS — Z8541 Personal history of malignant neoplasm of cervix uteri: Secondary | ICD-10-CM | POA: Diagnosis not present

## 2020-03-28 HISTORY — PX: CAROTID ENDARTERECTOMY: SUR193

## 2020-03-28 HISTORY — PX: ENDARTERECTOMY: SHX5162

## 2020-03-28 LAB — GLUCOSE, CAPILLARY
Glucose-Capillary: 172 mg/dL — ABNORMAL HIGH (ref 70–99)
Glucose-Capillary: 173 mg/dL — ABNORMAL HIGH (ref 70–99)
Glucose-Capillary: 317 mg/dL — ABNORMAL HIGH (ref 70–99)
Glucose-Capillary: 267 mg/dL — ABNORMAL HIGH (ref 70–99)

## 2020-03-28 LAB — URINALYSIS, ROUTINE W REFLEX MICROSCOPIC
Bacteria, UA: NONE SEEN
Bilirubin Urine: NEGATIVE
Glucose, UA: NEGATIVE mg/dL
Ketones, ur: NEGATIVE mg/dL
Nitrite: NEGATIVE
Protein, ur: NEGATIVE mg/dL
Specific Gravity, Urine: 1.009 (ref 1.005–1.030)
pH: 5 (ref 5.0–8.0)

## 2020-03-28 SURGERY — ENDARTERECTOMY, CAROTID
Anesthesia: General | Site: Neck | Laterality: Right

## 2020-03-28 MED ORDER — ACETAMINOPHEN 650 MG RE SUPP: 325.0000 mg | RECTAL | Status: DC | PRN

## 2020-03-28 MED ORDER — INSULIN ASPART 100 UNIT/ML ~~LOC~~ SOLN
0.0000 [IU] | Freq: Three times a day (TID) | SUBCUTANEOUS | Status: DC
  Administered 2020-03-28: 7 [IU] via SUBCUTANEOUS
  Administered 2020-03-29: 2 [IU] via SUBCUTANEOUS

## 2020-03-28 MED ORDER — ESMOLOL HCL 100 MG/10ML IV SOLN
INTRAVENOUS | Status: DC | PRN
Start: 2020-03-28 — End: 2020-03-28
  Administered 2020-03-28 (×5): 20 mg via INTRAVENOUS

## 2020-03-28 MED ORDER — OXYCODONE ER 9 MG PO C12A
9.0000 mg | EXTENDED_RELEASE_CAPSULE | Freq: Two times a day (BID) | ORAL | Status: DC
  Administered 2020-03-28 – 2020-03-29 (×2): 9 mg via ORAL

## 2020-03-28 MED ORDER — MORPHINE SULFATE (PF) 2 MG/ML IV SOLN
2.0000 mg | INTRAVENOUS | Status: DC | PRN
  Administered 2020-03-28 (×2): 2 mg via INTRAVENOUS
  Filled 2020-03-28 (×2): qty 1

## 2020-03-28 MED ORDER — ATORVASTATIN CALCIUM 40 MG PO TABS
40.0000 mg | ORAL_TABLET | Freq: Every day | ORAL | Status: DC
  Administered 2020-03-28: 40 mg via ORAL
  Filled 2020-03-28: qty 1

## 2020-03-28 MED ORDER — ALUM & MAG HYDROXIDE-SIMETH 200-200-20 MG/5ML PO SUSP: 15.0000 mL | ORAL | Status: DC | PRN

## 2020-03-28 MED ORDER — PROPOFOL 10 MG/ML IV BOLUS
INTRAVENOUS | Status: DC | PRN
  Administered 2020-03-28: 50 mg via INTRAVENOUS
  Administered 2020-03-28: 100 mg via INTRAVENOUS
  Administered 2020-03-28: 50 mg via INTRAVENOUS

## 2020-03-28 MED ORDER — ROCURONIUM BROMIDE 10 MG/ML (PF) SYRINGE
PREFILLED_SYRINGE | INTRAVENOUS | Status: AC
  Filled 2020-03-28: qty 10

## 2020-03-28 MED ORDER — POTASSIUM CHLORIDE CRYS ER 20 MEQ PO TBCR: 20.0000 meq | EXTENDED_RELEASE_TABLET | Freq: Every day | ORAL | Status: DC | PRN

## 2020-03-28 MED ORDER — CEFAZOLIN SODIUM-DEXTROSE 2-4 GM/100ML-% IV SOLN
2.0000 g | INTRAVENOUS | Status: AC
  Administered 2020-03-28: 2 g via INTRAVENOUS
  Filled 2020-03-28: qty 100

## 2020-03-28 MED ORDER — DEXAMETHASONE SODIUM PHOSPHATE 10 MG/ML IJ SOLN
INTRAMUSCULAR | Status: DC | PRN
  Administered 2020-03-28: 4 mg via INTRAVENOUS

## 2020-03-28 MED ORDER — 0.9 % SODIUM CHLORIDE (POUR BTL) OPTIME
TOPICAL | Status: DC | PRN
  Administered 2020-03-28: 2000 mL

## 2020-03-28 MED ORDER — DOCUSATE SODIUM 100 MG PO CAPS
100.0000 mg | ORAL_CAPSULE | Freq: Every day | ORAL | Status: DC
  Administered 2020-03-29: 100 mg via ORAL
  Filled 2020-03-28: qty 1

## 2020-03-28 MED ORDER — ACETAMINOPHEN 325 MG PO TABS
325.0000 mg | ORAL_TABLET | ORAL | Status: DC | PRN
  Administered 2020-03-28: 325 mg via ORAL
  Administered 2020-03-28: 650 mg via ORAL
  Filled 2020-03-28 (×3): qty 2

## 2020-03-28 MED ORDER — NITROGLYCERIN IN D5W 200-5 MCG/ML-% IV SOLN
INTRAVENOUS | Status: DC | PRN
  Administered 2020-03-28: 10 ug/min via INTRAVENOUS

## 2020-03-28 MED ORDER — PROTAMINE SULFATE 10 MG/ML IV SOLN
INTRAVENOUS | Status: AC
  Filled 2020-03-28: qty 5

## 2020-03-28 MED ORDER — PHENOL 1.4 % MT LIQD: 1.0000 | OROMUCOSAL | Status: DC | PRN

## 2020-03-28 MED ORDER — VITAMIN B-12 100 MCG PO TABS
100.0000 ug | ORAL_TABLET | Freq: Every day | ORAL | Status: DC
  Administered 2020-03-28 – 2020-03-29 (×2): 100 ug via ORAL
  Filled 2020-03-28 (×2): qty 1

## 2020-03-28 MED ORDER — CLOPIDOGREL BISULFATE 75 MG PO TABS
75.0000 mg | ORAL_TABLET | Freq: Every day | ORAL | Status: DC
  Administered 2020-03-29: 75 mg via ORAL
  Filled 2020-03-28: qty 1

## 2020-03-28 MED ORDER — BISACODYL 10 MG RE SUPP: 10.0000 mg | Freq: Every day | RECTAL | Status: DC | PRN

## 2020-03-28 MED ORDER — GABAPENTIN 300 MG PO CAPS
900.0000 mg | ORAL_CAPSULE | Freq: Two times a day (BID) | ORAL | Status: DC
  Administered 2020-03-28 – 2020-03-29 (×2): 900 mg via ORAL
  Filled 2020-03-28 (×2): qty 3

## 2020-03-28 MED ORDER — CHLORHEXIDINE GLUCONATE CLOTH 2 % EX PADS: 6.0000 | MEDICATED_PAD | Freq: Once | CUTANEOUS | Status: DC

## 2020-03-28 MED ORDER — HEPARIN SODIUM (PORCINE) 1000 UNIT/ML IJ SOLN
INTRAMUSCULAR | Status: DC | PRN
  Administered 2020-03-28: 7000 [IU] via INTRAVENOUS

## 2020-03-28 MED ORDER — SODIUM CHLORIDE 0.9 % IV SOLN: INTRAVENOUS | Status: DC

## 2020-03-28 MED ORDER — FENTANYL CITRATE (PF) 250 MCG/5ML IJ SOLN
INTRAMUSCULAR | Status: AC
  Filled 2020-03-28: qty 5

## 2020-03-28 MED ORDER — LABETALOL HCL 5 MG/ML IV SOLN
INTRAVENOUS | Status: DC | PRN
  Administered 2020-03-28: 5 mg via INTRAVENOUS
  Administered 2020-03-28: 10 mg via INTRAVENOUS
  Administered 2020-03-28: 5 mg via INTRAVENOUS

## 2020-03-28 MED ORDER — ROCURONIUM BROMIDE 10 MG/ML (PF) SYRINGE
PREFILLED_SYRINGE | INTRAVENOUS | Status: DC | PRN
  Administered 2020-03-28: 50 mg via INTRAVENOUS

## 2020-03-28 MED ORDER — LACTATED RINGERS IV SOLN: INTRAVENOUS | Status: DC | PRN

## 2020-03-28 MED ORDER — PROTAMINE SULFATE 10 MG/ML IV SOLN
INTRAVENOUS | Status: DC | PRN
  Administered 2020-03-28: 25 mg via INTRAVENOUS
  Administered 2020-03-28: 10 mg via INTRAVENOUS
  Administered 2020-03-28: 15 mg via INTRAVENOUS

## 2020-03-28 MED ORDER — OXYCODONE HCL 5 MG/5ML PO SOLN: 5.0000 mg | Freq: Once | ORAL | Status: DC | PRN

## 2020-03-28 MED ORDER — MAGNESIUM SULFATE 2 GM/50ML IV SOLN: 2.0000 g | Freq: Every day | INTRAVENOUS | Status: DC | PRN

## 2020-03-28 MED ORDER — GUAIFENESIN-DM 100-10 MG/5ML PO SYRP: 15.0000 mL | ORAL_SOLUTION | ORAL | Status: DC | PRN

## 2020-03-28 MED ORDER — ONDANSETRON HCL 4 MG/2ML IJ SOLN: 4.0000 mg | Freq: Four times a day (QID) | INTRAMUSCULAR | Status: DC | PRN

## 2020-03-28 MED ORDER — FENTANYL CITRATE (PF) 100 MCG/2ML IJ SOLN
INTRAMUSCULAR | Status: AC
  Filled 2020-03-28: qty 2

## 2020-03-28 MED ORDER — DEXAMETHASONE SODIUM PHOSPHATE 10 MG/ML IJ SOLN
INTRAMUSCULAR | Status: AC
  Filled 2020-03-28: qty 1

## 2020-03-28 MED ORDER — LIDOCAINE HCL (PF) 1 % IJ SOLN
INTRAMUSCULAR | Status: AC
  Filled 2020-03-28: qty 5

## 2020-03-28 MED ORDER — SODIUM CHLORIDE 0.9 % IV SOLN
INTRAVENOUS | Status: AC
  Filled 2020-03-28: qty 1.2

## 2020-03-28 MED ORDER — METOPROLOL TARTRATE 5 MG/5ML IV SOLN: 2.0000 mg | INTRAVENOUS | Status: DC | PRN

## 2020-03-28 MED ORDER — FENTANYL CITRATE (PF) 100 MCG/2ML IJ SOLN
25.0000 ug | INTRAMUSCULAR | Status: DC | PRN
  Administered 2020-03-28 (×2): 25 ug via INTRAVENOUS

## 2020-03-28 MED ORDER — CEFAZOLIN SODIUM-DEXTROSE 2-4 GM/100ML-% IV SOLN
2.0000 g | Freq: Three times a day (TID) | INTRAVENOUS | Status: AC
  Administered 2020-03-28 – 2020-03-29 (×2): 2 g via INTRAVENOUS
  Filled 2020-03-28 (×2): qty 100

## 2020-03-28 MED ORDER — SODIUM CHLORIDE 0.9 % IV SOLN: 500.0000 mL | Freq: Once | INTRAVENOUS | Status: DC | PRN

## 2020-03-28 MED ORDER — NITROGLYCERIN 0.2 MG/ML ON CALL CATH LAB
INTRAVENOUS | Status: DC | PRN
Start: 2020-03-28 — End: 2020-03-28
  Administered 2020-03-28 (×2): 40 ug via INTRAVENOUS
  Administered 2020-03-28 (×2): 20 ug via INTRAVENOUS
  Administered 2020-03-28: 40 ug via INTRAVENOUS

## 2020-03-28 MED ORDER — FENTANYL CITRATE (PF) 250 MCG/5ML IJ SOLN
INTRAMUSCULAR | Status: DC | PRN
  Administered 2020-03-28: 100 ug via INTRAVENOUS
  Administered 2020-03-28: 50 ug via INTRAVENOUS

## 2020-03-28 MED ORDER — METFORMIN HCL 500 MG PO TABS
500.0000 mg | ORAL_TABLET | Freq: Two times a day (BID) | ORAL | Status: DC
  Administered 2020-03-28 – 2020-03-29 (×2): 500 mg via ORAL
  Filled 2020-03-28 (×2): qty 1

## 2020-03-28 MED ORDER — LABETALOL HCL 5 MG/ML IV SOLN
INTRAVENOUS | Status: AC
  Filled 2020-03-28: qty 4

## 2020-03-28 MED ORDER — LABETALOL HCL 5 MG/ML IV SOLN
10.0000 mg | INTRAVENOUS | Status: DC | PRN
  Administered 2020-03-28: 10 mg via INTRAVENOUS

## 2020-03-28 MED ORDER — PANTOPRAZOLE SODIUM 40 MG PO TBEC
40.0000 mg | DELAYED_RELEASE_TABLET | Freq: Every day | ORAL | Status: DC
  Administered 2020-03-28 – 2020-03-29 (×2): 40 mg via ORAL
  Filled 2020-03-28 (×2): qty 1

## 2020-03-28 MED ORDER — ESMOLOL HCL 100 MG/10ML IV SOLN
INTRAVENOUS | Status: AC
  Filled 2020-03-28: qty 10

## 2020-03-28 MED ORDER — ONDANSETRON HCL 4 MG/2ML IJ SOLN
INTRAMUSCULAR | Status: DC | PRN
  Administered 2020-03-28: 4 mg via INTRAVENOUS

## 2020-03-28 MED ORDER — SUGAMMADEX SODIUM 200 MG/2ML IV SOLN
INTRAVENOUS | Status: DC | PRN
  Administered 2020-03-28: 150 mg via INTRAVENOUS

## 2020-03-28 MED ORDER — ASPIRIN EC 81 MG PO TBEC
81.0000 mg | DELAYED_RELEASE_TABLET | Freq: Every day | ORAL | Status: DC
  Administered 2020-03-28 – 2020-03-29 (×2): 81 mg via ORAL
  Filled 2020-03-28 (×2): qty 1

## 2020-03-28 MED ORDER — GEMFIBROZIL 600 MG PO TABS
600.0000 mg | ORAL_TABLET | Freq: Two times a day (BID) | ORAL | Status: DC
  Administered 2020-03-28 – 2020-03-29 (×2): 600 mg via ORAL
  Filled 2020-03-28 (×2): qty 1

## 2020-03-28 MED ORDER — OXYBUTYNIN CHLORIDE ER 10 MG PO TB24
10.0000 mg | ORAL_TABLET | Freq: Every day | ORAL | Status: DC
  Administered 2020-03-28: 10 mg via ORAL
  Filled 2020-03-28: qty 1

## 2020-03-28 MED ORDER — PHENYLEPHRINE HCL-NACL 10-0.9 MG/250ML-% IV SOLN
INTRAVENOUS | Status: DC | PRN
  Administered 2020-03-28: 10 ug/min via INTRAVENOUS

## 2020-03-28 MED ORDER — PROPOFOL 10 MG/ML IV BOLUS
INTRAVENOUS | Status: AC
  Filled 2020-03-28: qty 20

## 2020-03-28 MED ORDER — PHENYLEPHRINE 40 MCG/ML (10ML) SYRINGE FOR IV PUSH (FOR BLOOD PRESSURE SUPPORT)
PREFILLED_SYRINGE | INTRAVENOUS | Status: AC
  Filled 2020-03-28: qty 10

## 2020-03-28 MED ORDER — POLYETHYLENE GLYCOL 3350 17 G PO PACK: 17.0000 g | PACK | Freq: Every day | ORAL | Status: DC | PRN

## 2020-03-28 MED ORDER — ONDANSETRON HCL 4 MG/2ML IJ SOLN
INTRAMUSCULAR | Status: AC
  Filled 2020-03-28: qty 2

## 2020-03-28 MED ORDER — OXYCODONE HCL 5 MG PO TABS: 5.0000 mg | ORAL_TABLET | Freq: Once | ORAL | Status: DC | PRN

## 2020-03-28 MED ORDER — LIDOCAINE 2% (20 MG/ML) 5 ML SYRINGE
INTRAMUSCULAR | Status: AC
  Filled 2020-03-28: qty 5

## 2020-03-28 MED ORDER — HYDRALAZINE HCL 20 MG/ML IJ SOLN
INTRAMUSCULAR | Status: DC | PRN
  Administered 2020-03-28: 5 mg via INTRAVENOUS
  Administered 2020-03-28: 10 mg via INTRAVENOUS
  Administered 2020-03-28: 5 mg via INTRAVENOUS

## 2020-03-28 MED ORDER — LEVOTHYROXINE SODIUM 100 MCG PO TABS
100.0000 ug | ORAL_TABLET | Freq: Every day | ORAL | Status: DC
  Administered 2020-03-29: 100 ug via ORAL
  Filled 2020-03-28: qty 1

## 2020-03-28 MED ORDER — HYDRALAZINE HCL 20 MG/ML IJ SOLN: 5.0000 mg | INTRAMUSCULAR | Status: DC | PRN

## 2020-03-28 MED ORDER — SODIUM CHLORIDE 0.9 % IV SOLN
INTRAVENOUS | Status: DC | PRN
  Administered 2020-03-28: 500 mL

## 2020-03-28 MED ORDER — VITAMIN D (ERGOCALCIFEROL) 1.25 MG (50000 UNIT) PO CAPS: 50000.0000 [IU] | ORAL_CAPSULE | ORAL | Status: DC

## 2020-03-28 SURGICAL SUPPLY — 43 items
ADH SKN CLS APL DERMABOND .7 (GAUZE/BANDAGES/DRESSINGS) ×1
CANISTER SUCT 3000ML PPV (MISCELLANEOUS) ×2 IMPLANT
CANNULA VESSEL 3MM 2 BLNT TIP (CANNULA) ×4 IMPLANT
CATH ROBINSON RED A/P 18FR (CATHETERS) ×2 IMPLANT
CLIP LIGATING EXTRA MED SLVR (CLIP) ×2 IMPLANT
CLIP LIGATING EXTRA SM BLUE (MISCELLANEOUS) ×2 IMPLANT
COVER WAND RF STERILE (DRAPES) ×1 IMPLANT
DECANTER SPIKE VIAL GLASS SM (MISCELLANEOUS) IMPLANT
DERMABOND ADVANCED (GAUZE/BANDAGES/DRESSINGS) ×1
DERMABOND ADVANCED .7 DNX12 (GAUZE/BANDAGES/DRESSINGS) ×1 IMPLANT
DRAIN HEMOVAC 1/8 X 5 (WOUND CARE) IMPLANT
ELECT REM PT RETURN 9FT ADLT (ELECTROSURGICAL) ×2
ELECTRODE REM PT RTRN 9FT ADLT (ELECTROSURGICAL) ×1 IMPLANT
EVACUATOR SILICONE 100CC (DRAIN) IMPLANT
GLOVE BIO SURGEON STRL SZ 6.5 (GLOVE) ×2 IMPLANT
GLOVE BIOGEL PI IND STRL 7.0 (GLOVE) IMPLANT
GLOVE BIOGEL PI INDICATOR 7.0 (GLOVE) ×1
GLOVE SS BIOGEL STRL SZ 7.5 (GLOVE) ×1 IMPLANT
GLOVE SUPERSENSE BIOGEL SZ 7.5 (GLOVE) ×1
GLOVE SURG SS PI 6.5 STRL IVOR (GLOVE) ×1 IMPLANT
GOWN STRL REUS W/ TWL LRG LVL3 (GOWN DISPOSABLE) ×3 IMPLANT
GOWN STRL REUS W/TWL LRG LVL3 (GOWN DISPOSABLE) ×6
KIT BASIN OR (CUSTOM PROCEDURE TRAY) ×2 IMPLANT
KIT SHUNT ARGYLE CAROTID ART 6 (VASCULAR PRODUCTS) ×1 IMPLANT
KIT TURNOVER KIT B (KITS) ×2 IMPLANT
NEEDLE 22X1 1/2 (OR ONLY) (NEEDLE) IMPLANT
NS IRRIG 1000ML POUR BTL (IV SOLUTION) ×4 IMPLANT
PACK CAROTID (CUSTOM PROCEDURE TRAY) ×2 IMPLANT
PAD ARMBOARD 7.5X6 YLW CONV (MISCELLANEOUS) ×3 IMPLANT
PATCH HEMASHIELD 8X75 (Vascular Products) ×1 IMPLANT
POSITIONER HEAD DONUT 9IN (MISCELLANEOUS) ×2 IMPLANT
SHUNT CAROTID BYPASS 10 (VASCULAR PRODUCTS) IMPLANT
SHUNT CAROTID BYPASS 12FRX15.5 (VASCULAR PRODUCTS) IMPLANT
SUT ETHILON 3 0 PS 1 (SUTURE) IMPLANT
SUT PROLENE 6 0 CC (SUTURE) ×3 IMPLANT
SUT SILK 3 0 (SUTURE)
SUT SILK 3-0 18XBRD TIE 12 (SUTURE) IMPLANT
SUT VIC AB 3-0 SH 27 (SUTURE) ×4
SUT VIC AB 3-0 SH 27X BRD (SUTURE) ×2 IMPLANT
SUT VICRYL 4-0 PS2 18IN ABS (SUTURE) ×2 IMPLANT
SYR CONTROL 10ML LL (SYRINGE) IMPLANT
TOWEL GREEN STERILE (TOWEL DISPOSABLE) ×2 IMPLANT
WATER STERILE IRR 1000ML POUR (IV SOLUTION) ×2 IMPLANT

## 2020-03-28 NOTE — Anesthesia Procedure Notes (Signed)
Central Venous Catheter Insertion Performed by: Albertha Ghee, MD, anesthesiologist Start/End5/10/2020 8:10 AM, 03/28/2020 8:21 AM Patient location: Pre-op. Preanesthetic checklist: patient identified, IV checked, site marked, risks and benefits discussed, surgical consent, monitors and equipment checked, pre-op evaluation, timeout performed and anesthesia consent Position: Trendelenburg Lidocaine 1% used for infiltration and patient sedated Hand hygiene performed , maximum sterile barriers used  and Seldinger technique used Catheter size: 7 Fr Central line was placed.Double lumen Procedure performed using ultrasound guided technique. Ultrasound Notes:anatomy identified, needle tip was noted to be adjacent to the nerve/plexus identified, no ultrasound evidence of intravascular and/or intraneural injection and image(s) printed for medical record Attempts: 1 Following insertion, line sutured, dressing applied and Biopatch. Post procedure assessment: blood return through all ports, free fluid flow and no air  Patient tolerated the procedure well with no immediate complications.

## 2020-03-28 NOTE — Discharge Instructions (Signed)
   [facility]  Discharge Instructions   Carotid Endarterectomy (CEA)  Please refer to the following instructions for your post-procedure care. Your surgeon or physician assistant will discuss any changes with you.  Activity  You are encouraged to walk as much as you can. You can slowly return to normal activities but must avoid strenuous activity and heavy lifting until your doctor tell you it's okay. Avoid activities such as vacuuming or swinging a golf club. You can drive after one week if you are comfortable and you are no longer taking prescription pain medications. It is normal to feel tired for serval weeks after your surgery. It is also normal to have difficulty with sleep habits, eating, and bowel movements after surgery. These will go away with time.  Bathing/Showering  Shower daily after you go home. Do not soak in a bathtub, hot tub, or swim until the incision heals completely.  Incision Care  Shower every day. Clean your incision with mild soap and water. Pat the area dry with a clean towel. You do not need a bandage unless otherwise instructed. Do not apply any ointments or creams to your incision. You may have skin glue on your incision. Do not peel it off. It will come off on its own in about one week. Your incision may feel thickened and raised for several weeks after your surgery. This is normal and the skin will soften over time.   For Men Only: It's okay to shave around the incision but do not shave the incision itself for 2 weeks. It is common to have numbness under your chin that could last for several months.  Diet  Resume your normal diet. There are no special food restrictions following this procedure. A low fat/low cholesterol diet is recommended for all patients with vascular disease. In order to heal from your surgery, it is CRITICAL to get adequate nutrition. Your body requires vitamins, minerals, and protein. Vegetables are the  best source of vitamins and minerals. Vegetables also provide the perfect balance of protein. Processed food has little nutritional value, so try to avoid this.  Medications  Resume taking all of your medications unless your doctor or physician assistant tells you not to. If your incision is causing pain, you may take over-the- counter pain relievers such as acetaminophen (Tylenol). If you were prescribed a stronger pain medication, please be aware these medications can cause nausea and constipation. Prevent nausea by taking the medication with a snack or meal. Avoid constipation by drinking plenty of fluids and eating foods with a high amount of fiber, such as fruits, vegetables, and grains.  Do not take Tylenol if you are taking prescription pain medications.  Follow Up  Our office will schedule a follow up appointment 2-3 weeks following discharge.  Please call us immediately for any of the following conditions  . Increased pain, redness, drainage (pus) from your incision site. . Fever of 101 degrees or higher. . If you should develop stroke (slurred speech, difficulty swallowing, weakness on one side of your body, loss of vision) you should call 911 and go to the nearest emergency room. .  Reduce your risk of vascular disease:  . Stop smoking. If you would like help call [facility] at 1-800-QUIT-NOW 510-865-2689) or [place] at 5042632940. . Manage your cholesterol . Maintain a desired weight . Control your diabetes . Keep your blood pressure down .  If you have any questions, please call the office at [phone].

## 2020-03-28 NOTE — Anesthesia Procedure Notes (Signed)
Arterial Line Insertion Start/End5/10/2020 8:00 AM, 03/28/2020 8:05 AM Performed by: Renato Shin, CRNA, CRNA  Patient location: Pre-op. Preanesthetic checklist: patient identified, IV checked, site marked, risks and benefits discussed, surgical consent, monitors and equipment checked, pre-op evaluation, timeout performed and anesthesia consent Lidocaine 1% used for infiltration Left, radial was placed Catheter size: 20 G Hand hygiene performed , maximum sterile barriers used  and Seldinger technique used Allen's test indicative of satisfactory collateral circulation Attempts: 1 Procedure performed without using ultrasound guided technique. Following insertion, dressing applied and Biopatch. Post procedure assessment: normal  Patient tolerated the procedure well with no immediate complications.

## 2020-03-28 NOTE — Op Note (Signed)
   OPERATIVE REPORT  DATE OF SURGERY: 03/28/2020  PATIENT: [name], 74 y.o. female MRN: [id number]  DOB: 11-15-1946  PRE-OPERATIVE DIAGNOSIS: Right Carotid Stenosis, Asymptomatic  POST-OPERATIVE DIAGNOSIS:  Same  PROCEDURE:  Right Carotid Endarterectomy with Dacron Patch Angioplasty  SURGEON:  Curt Jews, M.D.  PHYSICIAN ASSISTANT: Rhyne PAC  ANESTHESIA:   general  EBL: Less than 200 ml  Total I/O In: 1100 [I.V.:1000; IV Piggyback:100] Out: 25 [Blood:25]  BLOOD ADMINISTERED: none  DRAINS: none   SPECIMEN: none  COUNTS CORRECT:  YES  PLAN OF CARE: Admit to inpatient   PATIENT DISPOSITION:  PACU - hemodynamically stable and neurologically intact.  PROCEDURE DETAILS: The patient was taken to the operating room placed in supine position.  General anesthesia was administered.  The neck was prepped and draped in the usual sterile fashion.  An incision was made anterior to the sternocleidomastoid and carried down through the platysma with electrocautery.  The sternocleidomastoid was reflected posteriorly and the carotid sheath was opened.  The facial vein was ligated with 2-0 silk ties and divided.  The common carotid artery was encircled with an umbilical tape and Rummel tourniquet.  The vagus nerve was identified and preserved.  Dissection was continued onto the carotid bifurcation.  The superior thyroid artery was encircled with a 2-0 silk Potts tie.  The external carotid was encircled with a blue vessel loop and the internal carotid was encircled with an umbilical tape and Rummel tourniquet.  The hypoglossal nerve was identified and preserved.  The patient was given systemic heparin and after adequate circulation time, the internal, external and common carotid arteries were occluded with vascular clamps.  The common carotid artery was opened with an 11 blade and extended  longitudinally with Potts scissors.  A 10 shunt was passed up the internal carotid and allowed to  backbleed.  It was then passed down the common carotid where it was secured with Rummel tourniquet.  The endarterectomy was begun on the common carotid artery and the plaque was divided proximally with Potts scissors.  The endarterectomy was continued onto the bifurcation.  The external carotid was endarterectomized with an eversion technique and the internal carotid was endarterectomized in an open fashion.  Remaining atheromatous debris was removed from the endarterectomy plane.  A Finesse Hemashield Dacron patch was brought onto the field and was sewn as a patch angioplasty with a running 6-0 Prolene suture.  Prior to completion of the closure the shunt was removed and the usual flushing maneuvers were undertaken.  The anastomosis was completed and flow was restored first to the external and then the internal carotid artery.  Excellent flow characteristics were noted with hand-held Doppler in the internal and external carotid arteries.  The patient was given 50 mg of protamine to reverse the heparin.  The wounds were irrigated with saline.  Hemostasis was obtained with electrocautery.  The wounds were closed with 3-0 Vicryl to reapproximate the sternocleidomastoid over the carotid sheath.  The platysma was lysed with a running 3-0 Vicryl suture.  The skin was closed with a 4-0 subcuticular Vicryl stitch.  Dermabond was applied.  The patient was awakened neurologically intact in the operating room and transferred to the recovery room in stable condition   Curt Jews, M.D. 03/28/2020 11:09 AM

## 2020-03-28 NOTE — Interval H&P Note (Signed)
History and Physical Interval Note:  03/28/2020 7:54 AM  [name]  has presented today for surgery, with the diagnosis of RIGHT CAROTID STENOSIS.  The various methods of treatment have been discussed with the patient and family. After consideration of risks, benefits and other options for treatment, the patient has consented to  Procedure(s): ENDARTERECTOMY CAROTID (Right) as a surgical intervention.  The patient's history has been reviewed, patient examined, no change in status, stable for surgery.  I have reviewed the patient's chart and labs.  Questions were answered to the patient's satisfaction.     Curt Jews

## 2020-03-28 NOTE — Progress Notes (Addendum)
  Day of Surgery Note    Subjective:  Says her headache is better.  (Per RN, had HA out of the OR)   Vitals:   03/28/20 1050 03/28/20 1105  BP: 138/60 (!) 132/53  Pulse: 76 74  Resp: 14 13  Temp:    SpO2: 98% 99%    Incisions:   Clean and dry without hematoma Extremities:  Moving all extremities equally Cardiac:  regular Lungs:  Non labored on [place] Neuro:  In tact; tongue is midline    Assessment/Plan:  This is a 74 y.o. female who is s/p  Right carotid endarterecotmy  -doing well in recovery with neuro in tact and tongue is midline.  -somewhat hypertensive in pacu with systolic in the mid 159'I.  Prn labetalol given and now 150's.  Systolic ok for 172'O-195'C  -to 4 east later today.    Leontine Locket, PA-C 03/28/2020 11:07 AM [phone]

## 2020-03-28 NOTE — Transfer of Care (Signed)
Immediate Anesthesia Transfer of Care Note  Patient: [name]  Procedure(s) Performed: RIGHT CAROTID ENDARTERECTOMY with PATCH ANGIOPLASTY (Right Neck)  Patient Location: PACU  Anesthesia Type:General  Level of Consciousness: awake, alert  and patient cooperative  Airway & Oxygen Therapy: Patient Spontanous Breathing and Patient connected to face mask oxygen  Post-op Assessment: Report given to RN, Post -op Vital signs reviewed and stable and Patient moving all extremities X 4  Post vital signs: Reviewed and stable  Last Vitals:  Vitals Value Taken Time  BP    Temp    Pulse    Resp    SpO2      Last Pain:  Vitals:   03/28/20 0658  TempSrc:   PainSc: 0-No pain      Patients Stated Pain Goal: 2 (17/98/10 2548)  Complications: No apparent anesthesia complications

## 2020-03-28 NOTE — Progress Notes (Signed)
Pt arrived from PACU to 4E room 3 after right CEA w/Early.  CHG bath and skin assessment completed.  Telemetry monitor applied and CCMD notified.  Vital signs being frequently cycled.  Pt oriented to unit and room to include call light and phone.  Will continue to monitor.

## 2020-03-28 NOTE — Anesthesia Procedure Notes (Signed)
Procedure Name: Intubation Date/Time: 03/28/2020 8:36 AM Performed by: Renato Shin, CRNA Pre-anesthesia Checklist: Patient identified, Emergency Drugs available, Suction available and Patient being monitored Patient Re-evaluated:Patient Re-evaluated prior to induction Oxygen Delivery Method: Circle system utilized Preoxygenation: Pre-oxygenation with 100% oxygen Induction Type: IV induction Ventilation: Mask ventilation without difficulty Laryngoscope Size: Miller and 2 Grade View: Grade I Tube type: Oral Tube size: 7.0 mm Number of attempts: 1 Airway Equipment and Method: Stylet and Oral airway Placement Confirmation: ETT inserted through vocal cords under direct vision,  positive ETCO2 and breath sounds checked- equal and bilateral Secured at: 20 cm Tube secured with: Tape Dental Injury: Teeth and Oropharynx as per pre-operative assessment

## 2020-03-28 NOTE — Progress Notes (Signed)
Pt's daughter brought 2 of her home oxycodone to hospital at pharmacy's request.  Two oxycodone were taken to pharmacy and inventoried.  meds will be dispensed from pharmacy to unit per pt's home dosing schedule.  Pharmacy was informed that pt's daughter asked for pt's empty medication bottle to be returned at the time of pt's DC.

## 2020-03-29 LAB — CBC
HCT: 31.3 % — ABNORMAL LOW (ref 36.0–46.0)
Hemoglobin: 9.7 g/dL — ABNORMAL LOW (ref 12.0–15.0)
MCH: 27.1 pg (ref 26.0–34.0)
MCHC: 31 g/dL (ref 30.0–36.0)
MCV: 87.4 fL (ref 80.0–100.0)
Platelets: 218 10*3/uL (ref 150–400)
RBC: 3.58 MIL/uL — ABNORMAL LOW (ref 3.87–5.11)
RDW: 14.2 % (ref 11.5–15.5)
WBC: 7.8 10*3/uL (ref 4.0–10.5)
nRBC: 0 % (ref 0.0–0.2)

## 2020-03-29 LAB — BASIC METABOLIC PANEL
Anion gap: 13 (ref 5–15)
BUN: 14 mg/dL (ref 8–23)
CO2: 19 mmol/L — ABNORMAL LOW (ref 22–32)
Calcium: 8.4 mg/dL — ABNORMAL LOW (ref 8.9–10.3)
Chloride: 109 mmol/L (ref 98–111)
Creatinine, Ser: 1.02 mg/dL — ABNORMAL HIGH (ref 0.44–1.00)
GFR calc Af Amer: >60 mL/min (ref >60)
GFR calc non Af Amer: 54 mL/min — ABNORMAL LOW (ref >60)
Glucose, Bld: 225 mg/dL — ABNORMAL HIGH (ref 70–99)
Potassium: 3.9 mmol/L (ref 3.5–5.1)
Sodium: 141 mmol/L (ref 135–145)

## 2020-03-29 LAB — GLUCOSE, CAPILLARY
Glucose-Capillary: 179 mg/dL — ABNORMAL HIGH (ref 70–99)
Glucose-Capillary: 158 mg/dL — ABNORMAL HIGH (ref 70–99)
Glucose-Capillary: 157 mg/dL — ABNORMAL HIGH (ref 70–99)

## 2020-03-29 NOTE — Discharge Summary (Signed)
Discharge Summary     [name] [birth year] 74 y.o. female  [id number]  Admission Date: 03/28/2020  Discharge Date: 03/29/2020  Physician: [name] Posner, MD  Admission Diagnosis: Carotid artery stenosis, symptomatic, right [I65.21]   HPI:   This is a 73 y.o. female seen in follow-up.  She was seen in consultation in the hospital where she had admission in early January for a left brain stroke.  She had a thorough evaluation which revealed high-grade asymptomatic right carotid stenosis.  This was a significant stroke leaving her with some word searching speech difficulty and right-sided weakness.  She looks quite good today.  On my last office visit with her she still had a great deal of weakness and speech difficulty.  This is markedly improved and she is walking with a rolling walker.  Hospital Course:  The patient was admitted to the hospital and taken to the operating room on 03/28/2020 and underwent right carotid endarterectomy.    The pt tolerated the procedure well and was transported to the PACU in good condition.   By POD 1, the pt neuro status was in tact.  She was moving all extremities equally, tongue midline.  She was not having any difficulty swallowing.  HH orders resumed and she was discharged home.   The remainder of the hospital course consisted of increasing mobilization and increasing intake of solids without difficulty.   Recent Labs    03/27/20 1212 03/29/20 0314  NA 142 141  K 3.5 3.9  CL 105 109  CO2 24 19*  GLUCOSE 157* 225*  BUN 13 14  CALCIUM 8.7* 8.4*   Recent Labs    03/27/20 1212 03/29/20 0314  WBC 7.2 7.8  HGB 12.6 9.7*  HCT 41.9 31.3*  PLT 275 218   Recent Labs    03/27/20 1212  INR 1.1       Discharge Diagnosis:  Carotid artery stenosis, symptomatic, right [I65.21]  Secondary Diagnosis: Patient Active Problem List   Diagnosis Date Noted  . Carotid artery stenosis, symptomatic, right 03/28/2020  . Preoperative  clearance 03/21/2020  . History of COVID-19 02/22/2020  . Carotid artery disease ([place]) 02/22/2020  . Seizure ([place]) 02/11/2020  . History of CVA (cerebrovascular accident) 02/11/2020  . Altered mental status 12/12/2019  . Hypoglycemia 12/12/2019  . Acute UTI 11/29/2019  . Normocytic anemia 11/29/2019  . Nausea, vomiting, and diarrhea 07/27/2019  . Hypokalemia 07/27/2019  . Hypomagnesemia 07/27/2019  . History of cervical cancer 03/18/2019  . Sepsis ([place]) 05/04/2018  . Acute lower UTI 05/04/2018  . Uncontrolled type 2 diabetes mellitus with hyperglycemia ([place]) 05/04/2018  . ARF (acute renal failure) ([place]) 05/04/2018  . Essential hypertension 05/04/2018  . Bilateral lower extremity edema 07/31/2017  . MUSCLE CRAMPS 09/19/2010  . NUMBNESS 09/19/2010  . Hypothyroidism 06/06/2010  . THRUSH 11/29/2008  . Type 2 diabetes mellitus ([place]) 10/20/2008  . Hyperlipidemia associated with type 2 diabetes mellitus ([place]) 10/20/2008  . BRONCHITIS, ACUTE WITH BRONCHOSPASM 10/20/2008  . EMPHYSEMA 10/20/2008  . C O P D 10/20/2008  . CROHN'S DISEASE 10/20/2008  . TOBACCO ABUSE-HISTORY OF 10/20/2008   Past Medical History:  Diagnosis Date  . Arthritis   . Basal ganglia stroke ([place])   . Bilateral lower extremity edema   . Cancer ([place])   . Flank pain   . Full dentures   . History of cervical cancer    11/ 1994  Stage IB  s/p  high dose radiation brachytherapy @ [facility] 01/ 1995---  per pt no recurrence  . History of chronic bronchitis   . History of hyperthyroidism    d 03/ 2011---due to grave's disease--- s/p  RAI treatment 04/ 2011  . History of kidney stones   . History of sepsis 05/03/2018   due to UTI with klebsiella/ pyelonephritis/ ureteral obstruction cause by stone  . Hyperlipidemia   . Hypothyroidism, postradioiodine therapy    endocrinologist--  dr Loanne Drilling--  dx graves disease and s/p RAI i131 treatement 4/ 2011  . Nauseated   . Oral thrush 05/03/2018  . Pneumonia   . Type 2 diabetes  mellitus treated with insulin ([place])    FOLLOWED BY PCP  . Urgency of urination   . Wears glasses     Allergies as of 03/29/2020      Reactions   Codeine Nausea Only      Medication List    TAKE these medications   aspirin 81 MG EC tablet Take 1 tablet (81 mg total) by mouth daily.   atorvastatin 40 MG tablet Commonly known as: LIPITOR Take 1 tablet (40 mg total) by mouth daily at 6 PM.   cephALEXin 250 MG capsule Commonly known as: KEFLEX Take 250 mg by mouth daily.   clopidogrel 75 MG tablet Commonly known as: PLAVIX Take 1 tablet (75 mg total) by mouth daily.   ergocalciferol 1.25 MG (50000 UT) capsule Commonly known as: VITAMIN D2 Take 50,000 Units by mouth every Sunday.   gabapentin 300 MG capsule Commonly known as: NEURONTIN Take 900 mg by mouth 2 (two) times daily.   gemfibrozil 600 MG tablet Commonly known as: LOPID Take 600 mg by mouth 2 (two) times daily.   levothyroxine 100 MCG tablet Commonly known as: SYNTHROID Take 1 tablet (100 mcg total) by mouth daily before breakfast.   metFORMIN 500 MG tablet Commonly known as: GLUCOPHAGE Take 500 mg by mouth 2 (two) times daily with a meal.   methocarbamol 500 MG tablet Commonly known as: ROBAXIN Take 500 mg by mouth in the morning and at bedtime.   oxybutynin 10 MG 24 hr tablet Commonly known as: DITROPAN-XL Take 10 mg by mouth at bedtime.   [name] FlexTouch 100 UNIT/ML FlexTouch Pen Generic drug: insulin degludec Inject 40 Units into the skin at bedtime.   vitamin B-12 100 MCG tablet Commonly known as: CYANOCOBALAMIN Take 100 mcg by mouth daily.   Xtampza ER 9 MG C12a Generic drug: oxyCODONE ER Take 9 mg by mouth every 12 (twelve) hours.        [facility] Discharge Instructions Carotid Endarterectomy (CEA)  Please refer to the following instructions for your post-procedure care. Your surgeon or physician assistant will discuss any changes with  you.  Activity  You are encouraged to walk as much as you can. You can slowly return to normal activities but must avoid strenuous activity and heavy lifting until your doctor tell you it's OK. Avoid activities such as vacuuming or swinging a golf club. You can drive after one week if you are comfortable and you are no longer taking prescription pain medications. It is normal to feel tired for serval weeks after your surgery. It is also normal to have difficulty with sleep habits, eating, and bowel movements after surgery. These will go away with time.  Bathing/Showering  You may shower after you come home. Do not soak in a bathtub, hot tub, or swim until the incision heals completely.  Incision Care  Shower every day. Clean your  incision with mild soap and water. Pat the area dry with a clean towel. You do not need a bandage unless otherwise instructed. Do not apply any ointments or creams to your incision. You may have skin glue on your incision. Do not peel it off. It will come off on its own in about one week. Your incision may feel thickened and raised for several weeks after your surgery. This is normal and the skin will soften over time. For Men Only: It's OK to shave around the incision but do not shave the incision itself for 2 weeks. It is common to have numbness under your chin that could last for several months.  Diet  Resume your normal diet. There are no special food restrictions following this procedure. A low fat/low cholesterol diet is recommended for all patients with vascular disease. In order to heal from your surgery, it is CRITICAL to get adequate nutrition. Your body requires vitamins, minerals, and protein. Vegetables are the best source of vitamins and minerals. Vegetables also provide the perfect balance of protein. Processed food has little nutritional value, so try to avoid this.  Medications  Resume taking all of your medications unless your doctor or physician  assistant tells you not to.  If your incision is causing pain, you may take over-the- counter pain relievers such as acetaminophen (Tylenol). If you were prescribed a stronger pain medication, please be aware these medications can cause nausea and constipation.  Prevent nausea by taking the medication with a snack or meal. Avoid constipation by drinking plenty of fluids and eating foods with a high amount of fiber, such as fruits, vegetables, and grains.  Do not take Tylenol if you are taking prescription pain medications.  Follow Up  Our office will schedule a follow up appointment 2-3 weeks following discharge.  Please call us immediately for any of the following conditions  . Increased pain, redness, drainage (pus) from your incision site. . Fever of 101 degrees or higher. . If you should develop stroke (slurred speech, difficulty swallowing, weakness on one side of your body, loss of vision) you should call 911 and go to the nearest emergency room. .  Reduce your risk of vascular disease:  . Stop smoking. If you would like help call [facility] at 1-800-QUIT-NOW 534-674-1861) or [place] at [phone]. . Manage your cholesterol . Maintain a desired weight . Control your diabetes . Keep your blood pressure down .  If you have any questions, please call the office at [phone].  Prescriptions given: 1.  None given as pt gets monthly narcotic  Disposition: home  Patient's condition: is Good  Follow up: 1. Dr. Donnetta Hutching in 2 weeks.   Leontine Locket, PA-C [facility] [phone]   --- For [facility] use ---   Modified Rankin score at D/C (0-6): 0  IV medication needed for:  1. Hypertension: No 2. Hypotension: No  Post-op Complications: No  1. Post-op CVA or TIA: No  If yes: Event classification (right eye, left eye, right cortical, left cortical, verterobasilar, other): n/a  If yes: Timing of event (intra-op, <6 hrs post-op, >=6 hrs  post-op, unknown): n/a  2. CN injury: No  If yes: CN n/a injuried   3. Myocardial infarction: No  If yes: Dx by (EKG or clinical, Troponin): n/a  4.  CHF: No  5.  Dysrhythmia (new): No  6. Wound infection: No  7. Reperfusion symptoms: No  8. Return to OR: No  If yes: return to  OR for (bleeding, neurologic, other CEA incision, other): n/a  Discharge medications: Statin use:  Yes ASA use:  Yes   Beta blocker use:  No ACE-Inhibitor use:  No  ARB use:  No CCB use: No P2Y12 Antagonist use: Yes, [ x Plavix, [ ]  Plasugrel, [ ]  Ticlopinine, [ ]  Ticagrelor, [ ]  Other, [ ]  No for medical reason, [ ]  Non-compliant, [ ]  Not-indicated Anti-coagulant use:  No, [ ]  Warfarin, [ ]  Rivaroxaban, [ ]  Dabigatran,

## 2020-03-29 NOTE — TOC Initial Note (Signed)
Transition of Care [facility]) - Initial/Assessment Note    Patient Details  Name: [name] MRN: [id number] Date of Birth: [birth year]  Transition of Care [facility]) CM/SW Contact:    Verdell Carmine, RN Phone Number: 03/29/2020, 10:19 AM  Clinical Narrative:                 Patient had home health previously from [facility] will resume these services once at home. Has walker at home . Does not have any other needs for equipment at home.   Expected Discharge Plan: [place] Barriers to Discharge: Continued Medical Work up   Patient Goals and CMS Choice Patient states their goals for this hospitalization and ongoing recovery are:: GO home with home health      Expected Discharge Plan and Services Expected Discharge Plan: [place]   Discharge Planning Services: CM Consult Post Acute Care Choice: [place] arrangements for the past 2 months: Single Family Home Expected Discharge Date: 03/29/20                 DME Agency: [facility] Date DME Agency Contacted: 03/29/20 Time DME Agency Contacted: 1016 Representative spoke with at DME Agency: [place]- renewal of services provided previously- had services before coming to the [place]: [facility] [place]     Representative spoke with at [place]: [place]  Prior Living Arrangements/Services Living arrangements for the past 2 months: [place] with:: Significant Other, Spouse Patient language and need for interpreter reviewed:: Yes Do you feel safe going back to the place where you live?: Yes      Need for Family Participation in Patient Care: Yes (Comment) Care giver support system in place?: Yes (comment) Current home services: Home RN, Home PT Criminal Activity/Legal Involvement Pertinent to Current Situation/Hospitalization: No - Comment as needed  Activities of Daily Living Home Assistive Devices/Equipment: Eyeglasses, Environmental consultant (specify type) ADL  Screening (condition at time of admission) Patient's cognitive ability adequate to safely complete daily activities?: Yes Is the patient deaf or have difficulty hearing?: No Does the patient have difficulty seeing, even when wearing glasses/contacts?: No Does the patient have difficulty concentrating, remembering, or making decisions?: No Patient able to express need for assistance with ADLs?: Yes Does the patient have difficulty dressing or bathing?: No Independently performs ADLs?: Yes (appropriate for developmental age) Does the patient have difficulty walking or climbing stairs?: Yes Weakness of Legs: Both Weakness of Arms/Hands: None  Permission Sought/Granted Permission sought to share information with : Case Manager Permission granted to share information with : Yes, Verbal Permission Granted  Share Information with NAME: Berlinda Parrill  Permission granted to share info w AGENCY: [facility]        Emotional Assessment Appearance:: Appears younger than stated age Attitude/Demeanor/Rapport: Engaged Affect (typically observed): Calm Orientation: : Oriented to Self, Oriented to Place, Oriented to  Time, Oriented to Situation Alcohol / Substance Use: Tobacco Use Psych Involvement: No (comment)  Admission diagnosis:  Carotid artery stenosis, symptomatic, right [I65.21] Patient Active Problem List   Diagnosis Date Noted  . Carotid artery stenosis, symptomatic, right 03/28/2020  . Preoperative clearance 03/21/2020  . History of COVID-19 02/22/2020  . Carotid artery disease ([place]) 02/22/2020  . Seizure ([place]) 02/11/2020  . History of CVA (cerebrovascular accident) 02/11/2020  . Altered mental status 12/12/2019  . Hypoglycemia 12/12/2019  . Acute UTI 11/29/2019  . Normocytic anemia 11/29/2019  . Nausea, vomiting, and diarrhea 07/27/2019  .  Hypokalemia 07/27/2019  . Hypomagnesemia 07/27/2019  . History of cervical cancer 03/18/2019  . Sepsis ([place]) 05/04/2018  . Acute lower UTI  05/04/2018  . Uncontrolled type 2 diabetes mellitus with hyperglycemia ([place]) 05/04/2018  . ARF (acute renal failure) ([place]) 05/04/2018  . Essential hypertension 05/04/2018  . Bilateral lower extremity edema 07/31/2017  . MUSCLE CRAMPS 09/19/2010  . NUMBNESS 09/19/2010  . Hypothyroidism 06/06/2010  . THRUSH 11/29/2008  . Type 2 diabetes mellitus ([place]) 10/20/2008  . Hyperlipidemia associated with type 2 diabetes mellitus ([place]) 10/20/2008  . BRONCHITIS, ACUTE WITH BRONCHOSPASM 10/20/2008  . EMPHYSEMA 10/20/2008  . C O P D 10/20/2008  . CROHN'S DISEASE 10/20/2008  . TOBACCO ABUSE-HISTORY OF 10/20/2008   PCP:  Everardo Beals, NP Pharmacy:   [facility] [address], [place] - [address] [place] [address] Phone: [phone] Fax: [phone]     Social Determinants of Health (SDOH) Interventions    Readmission Risk Interventions No flowsheet data found.

## 2020-03-29 NOTE — Plan of Care (Signed)
Discharge to home °

## 2020-03-29 NOTE — Progress Notes (Addendum)
  Progress Note    03/29/2020 7:15 AM 1 Day Post-Op  Subjective:  Says she feels pretty good.  No troubles swallowing. Has walked this morning.   Tm 99 now afebrile HR 60's-80's NSR 720'N-470'J systolic 62% RA  Vitals:   03/28/20 2330 03/29/20 0320  BP: (!) 144/56 (!) 145/54  Pulse:    Resp: 12 16  Temp: 98.3 F (36.8 C) 98.4 F (36.9 C)  SpO2: 97% 99%     Physical Exam: Neuro:  In tact; moving all extremities equally; tongue is midline Lungs:  Non labored Incision:  Clean and dry with mild ecchymosis around incision.  No hematoma  CBC    Component Value Date/Time   WBC 7.8 03/29/2020 0314   RBC 3.58 (L) 03/29/2020 0314   HGB 9.7 (L) 03/29/2020 0314   HCT 31.3 (L) 03/29/2020 0314   PLT 218 03/29/2020 0314   MCV 87.4 03/29/2020 0314   MCH 27.1 03/29/2020 0314   MCHC 31.0 03/29/2020 0314   RDW 14.2 03/29/2020 0314   LYMPHSABS 1.5 02/10/2020 1900   MONOABS 0.5 02/10/2020 1900   EOSABS 0.1 02/10/2020 1900   BASOSABS 0.0 02/10/2020 1900    BMET    Component Value Date/Time   NA 141 03/29/2020 0314   NA 144 08/21/2017 1553   K 3.9 03/29/2020 0314   CL 109 03/29/2020 0314   CO2 19 (L) 03/29/2020 0314   GLUCOSE 225 (H) 03/29/2020 0314   BUN 14 03/29/2020 0314   BUN 21 08/21/2017 1553   CREATININE 1.02 (H) 03/29/2020 0314   CALCIUM 8.4 (L) 03/29/2020 0314   CALCIUM 9.6 09/19/2010 2220   GFRNONAA 54 (L) 03/29/2020 0314   GFRAA >60 03/29/2020 0314     Intake/Output Summary (Last 24 hours) at 03/29/2020 0715 Last data filed at 03/29/2020 0600 Gross per 24 hour  Intake 3065.42 ml  Output 625 ml  Net 2440.42 ml     Assessment/Plan:  This is a 74 y.o. female who is s/p right CEA 1 Day Post-Op  -pt is doing well this am. -pt neuro exam is in tact -pt has ambulated -pt has voided -will d/c home today and she will f/u with Dr. Donnetta Hutching in 2-3 weeks.   Leontine Locket, PA-C [facility] [phone]   I have examined the patient,  reviewed and agree with above.  Curt Jews, MD 03/29/2020 9:26 AM

## 2020-03-30 NOTE — Anesthesia Postprocedure Evaluation (Signed)
Anesthesia Post Note  Patient: [name]  Procedure(s) Performed: RIGHT CAROTID ENDARTERECTOMY with PATCH ANGIOPLASTY (Right Neck)     Patient location during evaluation: PACU Anesthesia Type: General Level of consciousness: awake and alert Pain management: pain level controlled Vital Signs Assessment: post-procedure vital signs reviewed and stable Respiratory status: spontaneous breathing, nonlabored ventilation, respiratory function stable and patient connected to nasal cannula oxygen Cardiovascular status: blood pressure returned to baseline and stable Postop Assessment: no apparent nausea or vomiting Anesthetic complications: no    Last Vitals:  Vitals:   03/29/20 0320 03/29/20 0750  BP: (!) 145/54 (!) 151/61  Pulse:    Resp: 16 12  Temp: 36.9 C 36.6 C  SpO2: 99% 100%    Last Pain:  Vitals:   03/29/20 0908  TempSrc:   PainSc: 0-No pain                 Donte Lenzo S

## 2020-04-04 ENCOUNTER — Ambulatory Visit (INDEPENDENT_AMBULATORY_CARE_PROVIDER_SITE_OTHER): Payer: Medicare HMO | Admitting: Podiatry

## 2020-04-04 ENCOUNTER — Encounter: Payer: Self-pay | Admitting: Podiatry

## 2020-04-04 ENCOUNTER — Other Ambulatory Visit: Payer: Self-pay

## 2020-04-04 DIAGNOSIS — M79674 Pain in right toe(s): Secondary | ICD-10-CM

## 2020-04-04 DIAGNOSIS — B351 Tinea unguium: Secondary | ICD-10-CM | POA: Diagnosis not present

## 2020-04-04 DIAGNOSIS — M79675 Pain in left toe(s): Secondary | ICD-10-CM | POA: Diagnosis not present

## 2020-04-04 DIAGNOSIS — L84 Corns and callosities: Secondary | ICD-10-CM | POA: Diagnosis not present

## 2020-04-04 DIAGNOSIS — E1142 Type 2 diabetes mellitus with diabetic polyneuropathy: Secondary | ICD-10-CM | POA: Diagnosis not present

## 2020-04-04 NOTE — Patient Instructions (Signed)
Diabetes Mellitus and Foot Care Foot care is an important part of your health, especially when you have diabetes. Diabetes may cause you to have problems because of poor blood flow (circulation) to your feet and legs, which can cause your skin to:  Become thinner and drier.  Break more easily.  Heal more slowly.  Peel and crack. You may also have nerve damage (neuropathy) in your legs and feet, causing decreased feeling in them. This means that you may not notice minor injuries to your feet that could lead to more serious problems. Noticing and addressing any potential problems early is the best way to prevent future foot problems. How to care for your feet Foot hygiene  Wash your feet daily with warm water and mild soap. Do not use hot water. Then, pat your feet and the areas between your toes until they are completely dry. Do not soak your feet as this can dry your skin.  Trim your toenails straight across. Do not dig under them or around the cuticle. File the edges of your nails with an emery board or nail file.  Apply a moisturizing lotion or petroleum jelly to the skin on your feet and to dry, brittle toenails. Use lotion that does not contain alcohol and is unscented. Do not apply lotion between your toes. Shoes and socks  Wear clean socks or stockings every day. Make sure they are not too tight. Do not wear knee-high stockings since they may decrease blood flow to your legs.  Wear shoes that fit properly and have enough cushioning. Always look in your shoes before you put them on to be sure there are no objects inside.  To break in new shoes, wear them for just a few hours a day. This prevents injuries on your feet. Wounds, scrapes, corns, and calluses  Check your feet daily for blisters, cuts, bruises, sores, and redness. If you cannot see the bottom of your feet, use a mirror or ask someone for help.  Do not cut corns or calluses or try to remove them with medicine.  If you  find a minor scrape, cut, or break in the skin on your feet, keep it and the skin around it clean and dry. You may clean these areas with mild soap and water. Do not clean the area with peroxide, alcohol, or iodine.  If you have a wound, scrape, corn, or callus on your foot, look at it several times a day to make sure it is healing and not infected. Check for: ? Redness, swelling, or pain. ? Fluid or blood. ? Warmth. ? Pus or a bad smell. General instructions  Do not cross your legs. This may decrease blood flow to your feet.  Do not use heating pads or hot water bottles on your feet. They may burn your skin. If you have lost feeling in your feet or legs, you may not know this is happening until it is too late.  Protect your feet from hot and cold by wearing shoes, such as at the beach or on hot pavement.  Schedule a complete foot exam at least once a year (annually) or more often if you have foot problems. If you have foot problems, report any cuts, sores, or bruises to your health care provider immediately. Contact a health care provider if:  You have a medical condition that increases your risk of infection and you have any cuts, sores, or bruises on your feet.  You have an injury that is not   healing.  You have redness on your legs or feet.  You feel burning or tingling in your legs or feet.  You have pain or cramps in your legs and feet.  Your legs or feet are numb.  Your feet always feel cold.  You have pain around a toenail. Get help right away if:  You have a wound, scrape, corn, or callus on your foot and: ? You have pain, swelling, or redness that gets worse. ? You have fluid or blood coming from the wound, scrape, corn, or callus. ? Your wound, scrape, corn, or callus feels warm to the touch. ? You have pus or a bad smell coming from the wound, scrape, corn, or callus. ? You have a fever. ? You have a red line going up your leg. Summary  Check your feet every day  for cuts, sores, red spots, swelling, and blisters.  Moisturize feet and legs daily.  Wear shoes that fit properly and have enough cushioning.  If you have foot problems, report any cuts, sores, or bruises to your health care provider immediately.  Schedule a complete foot exam at least once a year (annually) or more often if you have foot problems. This information is not intended to replace advice given to you by your health care provider. Make sure you discuss any questions you have with your health care provider. Document Revised: 07/27/2019 Document Reviewed: 12/05/2016 Elsevier Patient Education  2020 [employer]  

## 2020-04-12 NOTE — Progress Notes (Signed)
Subjective: [name] presents today preventative diabetic foot care with h/o preulcerative callus(es) left hallux and painful mycotic toenails b/l that are difficult to trim. Pain interferes with ambulation. Aggravating factors include wearing enclosed shoe gear. Pain is relieved with periodic professional debridement.   She recently had CEA of right carotid artery on 03/28/2020. She is still recovering from that.   [name] voices no new pedal problems on today's visit.  Everardo Beals, NP is patient's PCP. Last visit was: 12/28/2019.  Past Medical History:  Diagnosis Date  . Arthritis   . Basal ganglia stroke ([place])   . Bilateral lower extremity edema   . Cancer ([place])   . Flank pain   . Full dentures   . History of cervical cancer    11/ 1994  Stage IB  s/p  high dose radiation brachytherapy @ [facility] 01/ 1995--- per pt no recurrence  . History of chronic bronchitis   . History of hyperthyroidism    d 03/ 2011---due to grave's disease--- s/p  RAI treatment 04/ 2011  . History of kidney stones   . History of sepsis 05/03/2018   due to UTI with klebsiella/ pyelonephritis/ ureteral obstruction cause by stone  . Hyperlipidemia   . Hypothyroidism, postradioiodine therapy    endocrinologist--  dr Loanne Drilling--  dx graves disease and s/p RAI i131 treatement 4/ 2011  . Nauseated   . Oral thrush 05/03/2018  . Pneumonia   . Type 2 diabetes mellitus treated with insulin ([place])    FOLLOWED BY PCP  . Urgency of urination   . Wears glasses      Current Outpatient Medications on File Prior to Visit  Medication Sig Dispense Refill  . aspirin EC 81 MG EC tablet Take 1 tablet (81 mg total) by mouth daily. 30 tablet 1  . atorvastatin (LIPITOR) 40 MG tablet Take 1 tablet (40 mg total) by mouth daily at 6 PM. 30 tablet 1  . cephALEXin (KEFLEX) 250 MG capsule Take 250 mg by mouth daily.     . clopidogrel (PLAVIX) 75 MG tablet Take 1 tablet (75 mg total) by mouth daily. 21 tablet 0  .  ergocalciferol (VITAMIN D2) 50000 UNITS capsule Take 50,000 Units by mouth every Sunday.     . gabapentin (NEURONTIN) 300 MG capsule Take 900 mg by mouth 2 (two) times daily.     Marland Kitchen gemfibrozil (LOPID) 600 MG tablet Take 600 mg by mouth 2 (two) times daily.      . insulin degludec (TRESIBA FLEXTOUCH) 100 UNIT/ML FlexTouch Pen Inject 40 Units into the skin at bedtime.     Marland Kitchen levothyroxine (SYNTHROID) 100 MCG tablet Take 1 tablet (100 mcg total) by mouth daily before breakfast. 30 tablet 1  . metFORMIN (GLUCOPHAGE) 500 MG tablet Take 500 mg by mouth 2 (two) times daily with a meal.    . methocarbamol (ROBAXIN) 500 MG tablet Take 500 mg by mouth in the morning and at bedtime.     Marland Kitchen oxybutynin (DITROPAN-XL) 10 MG 24 hr tablet Take 10 mg by mouth at bedtime.     Marland Kitchen oxyCODONE ER (XTAMPZA ER) 9 MG C12A Take 9 mg by mouth every 12 (twelve) hours.    . vitamin B-12 (CYANOCOBALAMIN) 100 MCG tablet Take 100 mcg by mouth daily.     No current facility-administered medications on file prior to visit.     Allergies  Allergen Reactions  . Codeine Nausea Only    Objective: [name] is a pleasant 74  y.o. y.o. Patient Race: White or Caucasian [1]  female in NAD. AAO x 3.  There were no vitals filed for this visit.  Vascular Examination: Neurovascular status unchanged b/l. Capillary fill time to digits <3 seconds b/l. Palpable DP pulses b/l. Palpable PT pulses b/l. Pedal hair absent b/l Skin temperature gradient within normal limits b/l.  Dermatological Examination: Pedal skin with normal turgor, texture and tone bilaterally. No open wounds bilaterally. No interdigital macerations bilaterally. Toenails 1-5 b/l elongated, dystrophic, thickened, crumbly with subungual debris and tenderness to dorsal palpation. Porokeratotic lesion(s) plantar IPJ of L hallux with subdermal hemorrhage. No erythema, no edema, no drainage, no flocculence.  Musculoskeletal: Normal muscle strength 5/5 to all lower  extremity muscle groups bilaterally. No pain crepitus or joint limitation noted with ROM b/l. Hammertoes noted to the L 2nd toe and R 2nd toe.  Neurological Examination: Protective sensation diminished with 10g monofilament b/l. Vibratory sensation decreased b/l.  Assessment: 1. Pain due to onychomycosis of toenails of both feet   2. Pre-ulcerative calluses   3. Diabetic peripheral neuropathy associated with type 2 diabetes mellitus ([place])   Plan: -Examined patient. -No new findings. No new orders. -Continue diabetic foot care principles. Literature dispensed on today.  -Toenails 1-5 b/l were debrided in length and girth with sterile nail nippers and dremel without iatrogenic bleeding.  -Painful porokeratotic lesion(s) L hallux pared and enucleated with sterile scalpel blade without incident. -Patient to continue soft, supportive shoe gear daily. -Patient to report any pedal injuries to medical professional immediately. -Patient/POA to call should there be question/concern in the interim.  Return in about 9 weeks (around 06/06/2020) for diabetic nail and callus trim.  [name], DPM

## 2020-04-17 ENCOUNTER — Other Ambulatory Visit: Payer: Self-pay

## 2020-04-17 ENCOUNTER — Ambulatory Visit (INDEPENDENT_AMBULATORY_CARE_PROVIDER_SITE_OTHER): Payer: Self-pay | Admitting: Vascular Surgery

## 2020-04-17 ENCOUNTER — Encounter: Payer: Self-pay | Admitting: Vascular Surgery

## 2020-04-17 VITALS — BP 155/72 | HR 69 | Temp 97.7°F | Resp 20 | Ht 60.0 in | Wt 154.0 lb

## 2020-04-17 DIAGNOSIS — I6521 Occlusion and stenosis of right carotid artery: Secondary | ICD-10-CM

## 2020-04-17 NOTE — Progress Notes (Signed)
   Patient name: [name] MRN: [id number] DOB: [birth year] Sex: female  REASON FOR VISIT: Follow-up right carotid endarterectomy on 03/28/2020  HPI: [name] is a 74 y.o. female here today for follow-up.  She underwent uneventful right carotid endarterectomy on 03/28/2020 and was discharged home on postoperative day 1.  She found to have a critical right carotid stenosis on work-up of a neurologic change.  This was felt to be asymptomatic.  She did have a right brain stroke but not in the carotid distribution.  She continues to recover.  Current Outpatient Medications  Medication Sig Dispense Refill  . aspirin EC 81 MG EC tablet Take 1 tablet (81 mg total) by mouth daily. 30 tablet 1  . atorvastatin (LIPITOR) 40 MG tablet Take 1 tablet (40 mg total) by mouth daily at 6 PM. 30 tablet 1  . cephALEXin (KEFLEX) 250 MG capsule Take 250 mg by mouth daily.     . clopidogrel (PLAVIX) 75 MG tablet Take 1 tablet (75 mg total) by mouth daily. 21 tablet 0  . ergocalciferol (VITAMIN D2) 50000 UNITS capsule Take 50,000 Units by mouth every Sunday.     . gabapentin (NEURONTIN) 300 MG capsule Take 900 mg by mouth 2 (two) times daily.     Marland Kitchen gemfibrozil (LOPID) 600 MG tablet Take 600 mg by mouth 2 (two) times daily.      . insulin degludec (TRESIBA FLEXTOUCH) 100 UNIT/ML FlexTouch Pen Inject 40 Units into the skin at bedtime.     Marland Kitchen levothyroxine (SYNTHROID) 100 MCG tablet Take 1 tablet (100 mcg total) by mouth daily before breakfast. 30 tablet 1  . metFORMIN (GLUCOPHAGE) 500 MG tablet Take 500 mg by mouth 2 (two) times daily with a meal.    . methocarbamol (ROBAXIN) 500 MG tablet Take 500 mg by mouth in the morning and at bedtime.     Marland Kitchen oxybutynin (DITROPAN-XL) 10 MG 24 hr tablet Take 10 mg by mouth at bedtime.     Marland Kitchen oxyCODONE ER (XTAMPZA ER) 9 MG C12A Take 9 mg by mouth every 12 (twelve) hours.    . vitamin B-12 (CYANOCOBALAMIN) 100 MCG tablet Take 100 mcg by mouth  daily.     No current facility-administered medications for this visit.     PHYSICAL EXAM: Vitals:   04/17/20 1511 04/17/20 1514  BP: (!) 146/74 (!) 155/72  Pulse: 69   Resp: 20   Temp: 97.7 F (36.5 C)   SpO2: 96%   Weight: 154 lb (69.9 kg)   Height: 5' (1.524 m)     GENERAL: The patient is a well-nourished female, in no acute distress. The vital signs are documented above. Right neck incision well-healed with no bruits bilaterally  MEDICAL ISSUES: Stable status post carotid endarterectomy on 03/28/2020.  She will resume full activities.  We will see her again in our office in 9 months with repeat carotid duplex.  She will notify should she develop any difficulties.  She had minimal disease in her left carotid artery preoperatively   Rosetta Posner, MD [facility] Tel [phone] Pager [phone]

## 2020-04-19 ENCOUNTER — Other Ambulatory Visit: Payer: Self-pay | Admitting: *Deleted

## 2020-04-19 DIAGNOSIS — I6521 Occlusion and stenosis of right carotid artery: Secondary | ICD-10-CM

## 2020-05-07 ENCOUNTER — Encounter: Payer: Self-pay | Admitting: Adult Health

## 2020-05-07 ENCOUNTER — Ambulatory Visit: Payer: Medicare HMO | Admitting: Adult Health

## 2020-05-07 ENCOUNTER — Other Ambulatory Visit: Payer: Self-pay

## 2020-05-07 VITALS — BP 151/61 | HR 69 | Ht 61.0 in | Wt 154.0 lb

## 2020-05-07 DIAGNOSIS — E785 Hyperlipidemia, unspecified: Secondary | ICD-10-CM | POA: Diagnosis not present

## 2020-05-07 DIAGNOSIS — I639 Cerebral infarction, unspecified: Secondary | ICD-10-CM | POA: Diagnosis not present

## 2020-05-07 DIAGNOSIS — I6381 Other cerebral infarction due to occlusion or stenosis of small artery: Secondary | ICD-10-CM

## 2020-05-07 DIAGNOSIS — I6521 Occlusion and stenosis of right carotid artery: Secondary | ICD-10-CM

## 2020-05-07 DIAGNOSIS — E11649 Type 2 diabetes mellitus with hypoglycemia without coma: Secondary | ICD-10-CM

## 2020-05-07 DIAGNOSIS — I10 Essential (primary) hypertension: Secondary | ICD-10-CM

## 2020-05-07 NOTE — Progress Notes (Signed)
[facility] [address] [place]. [place] [address] [phone]       STROKE FOLLOW UP NOTE  [name] Date of Birth:  05/21/1946 Medical Record Number:  [id number]   Reason for Referral:  stroke follow up    CHIEF COMPLAINT:  Chief Complaint  Patient presents with  . Follow-up    tx rm here for a f/u from a stroke. Pt has no new sx.    HPI:   Today, 05/07/2020, [name] returns for follow-up regarding left BG stroke in 11/2019 accompanied by.  Residual deficits of occasional word finding difficulty and RLE weakness but overall greatly improving. Continues to ambulate with rollator walker when out doors but when inside, able to ambulate without assistive device.  Denies new stroke/TIA symptoms.  She did undergo uneventful right CEA on 03/28/2020 by Dr. Donnetta Hutching for asymptomatic right ICA 80% stenosis.  Follow-up visit recently with Dr. Donnetta Hutching recommended continuation of DAPT and repeat carotid Doppler 9 months post procedure.  Continues on aspirin 16m and plavix (per VVS recommendations) and atorvastatin for secondary stroke prevention.  Blood pressure today 151/61.  No concerns at this time.    History provided for reference purposes only Initial visit 01/17/2020 JM: [name] a 74year old female who is being seen today for hospital follow-up accompanied by her daughter.  Residual deficits of mild expressive aphasia and right sided weakness but overall greatly improving. Currently working with home health PT and nursing.  Ambulates with Rollator walker without recent falls.  She was not seen/evaluated by speech therapy as recommend. She does live with husband but family will stop in to check on her throughout the day. She continues on aspirin, plavix and atorvastatin for secondary stroke prevention without side effects.  Blood pressure today 134/60.  She did have evaluation by the vascular surgery Dr. EDonnetta Hutchingwho recommended proceeding with right carotid  endarterectomy for severe asymptomatic right ICA stenosis and recommended awaiting several months for stroke recovery.  No further concerns at this time.   Stroke admission 12/13/2019: Ms. BTINLEE NAVARRETTEis a 74y.o. female with history of insulin-dependent diabetes mellitus, hypertension, COPD with recent admission for pyelonephritison 1/12,incidentally found to be COVID-19 positive presented on 12/13/2019 with increasing fatigue found to have glucose of 42 and R sided weakness.  Evaluated by stroke team and Dr. XErlinda Hongwith stroke work-up revealing left basal ganglia infarct likely secondary to small vessel disease source.  CTA neck showed right ICA bulb 80% stenosis and left VA origin 55% stenosis.  Carotid Doppler right 40 to 59% stenosis and left 40 to 59% stenosis.  MRA head unremarkable.  2D echo unremarkable.  Recommended DAPT for 3 weeks and aspirin alone.  Repeat Covid test 1/26 positive.  Encephalopathy with EEG showed generalized slowing felt likely related to hypoglycemia episode, stroke and hypokalemia.  Right carotid stenosis asymptomatic at present time and recommended follow-up with VVS outpatient.  HTN stable.  LDL 77 and initiated atorvastatin 40 mg daily.  Type 2 diabetes with frequent hypoglycemic events and A1c 5.5.  Current tobacco use with smoking cessation counseling provided.  Other stroke risk factors include advanced age and obesity but no prior history of stroke.  Other active problems include history of cervical cancer s/p XRT 1994, hypothyroidism and pyelonephritis.  Discharged home in stable condition recommendation home health SLP and PT.  Stroke:   L basal ganglia infarct, likely secondary to small vessel disease source.   Code Stroke age indeterminate L basal  ganglia lacune new since 2015.   MRI  Acute L basal ganglia infarct. Small vessel disease.   MRA  Unremarkable   Carotid Doppler  R 40-59%, L 40-59%  CTA neck right ICA bulb 80% stenosis. Left VA origin 55%  stenosis  2D Echo EF 60-65%  LDL 77  HgbA1c 5.5  Lovenox 40 mg sq daily for VTE prophylaxis  No antithrombotic prior to admission, now on ASA 81 and plavix 56m DAPT. Continue for 3 weeks and then ASA alone  Therapy recommendations:  SLP, HH PT  Disposition:  home   ROS:   14 system review of systems performed and negative with exception of weakness, speech difficulty and gait impairment  PMH:  Past Medical History:  Diagnosis Date  . Arthritis   . [place]   . Bilateral lower extremity edema   . [place]   . Carotid artery occlusion   . Flank pain   . Full dentures   . History of cervical cancer    11/ 1994  Stage IB  s/p  high dose radiation brachytherapy @ [facility] 01/ 1995--- per pt no recurrence  . History of chronic bronchitis   . History of hyperthyroidism    d 03/ 2011---due to grave's disease--- s/p  RAI treatment 04/ 2011  . History of kidney stones   . History of sepsis 05/03/2018   due to UTI with klebsiella/ pyelonephritis/ ureteral obstruction cause by stone  . Hyperlipidemia   . Hypothyroidism, postradioiodine therapy    endocrinologist--  dr eLoanne Drilling-  dx graves disease and s/p RAI i131 treatement 4/ 2011  . Nauseated   . Oral thrush 05/03/2018  . Pneumonia   . Type [place] diabetes mellitus [place] with insulin ([place]    FOLLOWED BY PCP  . Urgency of urination   . Wears glasses     PSH:  Past Surgical History:  Procedure Laterality Date  . APPENDECTOMY    . CAROTID ENDARTERECTOMY Right 03/28/2020  . CATARACT EXTRACTION W/ INTRAOCULAR LENS  IMPLANT, BILATERAL  2004  approx.  . CYSTOSCOPY WITH STENT PLACEMENT Right 05/04/2018   Procedure: CYSTOSCOPY WITH STENT PLACEMENT AND RETROGRADE PYELOGRAM;  Surgeon: WCeasar Mons MD;  Location: [facility];  Service: Urology;  Laterality: Right;  . CYSTOSCOPY/URETEROSCOPY/HOLMIUM LASER/STENT PLACEMENT Right 05/19/2018   Procedure: CYSTOSCOPY, URETEROSCOPY/HOLMIUM LASER, STONE BASKETRY/  STENT EXCHANGE;  Surgeon: WCeasar Mons MD;  [facility]  Service: Urology;  Laterality: Right;  . ENDARTERECTOMY Right 03/28/2020   Procedure: RIGHT CAROTID ENDARTERECTOMY with PATCH ANGIOPLASTY;  Surgeon: ERosetta Posner MD;  [facility]  Service: Vascular;  Laterality: Right;  . EXCISION MASS LEFT CHEST WALL  09-20-2007   [facility] . Radioactive Iodine Therapy     for thyroid  . REVISION TOTAL HIP ARTHROPLASTY Left early 2000s  . TANDEM RING INSERTION  1995   dr cAldean Ast@ [facility]   EUA w/ tandem placement in ovid  (for direct high dose radiation brachytherapy , cervical cancer)  . TOTAL HIP ARTHROPLASTY Left 1990s  . TRANSTHORACIC ECHOCARDIOGRAM  08/07/2017   mild focal basal hypertrophy of the septum,  ef 641-28% grade 1 diastolic dysfunction/  atrial septum with lipomatous hypertrophy/  trivial PR    Social History:  Social History   Socioeconomic History  . Marital status: Married    Spouse name: Not on file  . Number of children: 2  . Years of education: Not on file  . Highest  education level: Not on file  Occupational History  . Occupation: Disabled  Tobacco Use  . Smoking status: Former Smoker    Years: 5.00    Types: Cigarettes    Quit date: 05/13/1982    Years since quitting: 38.0  . Smokeless tobacco: Never Used  Vaping Use  . Vaping Use: Never used  Substance and Sexual Activity  . Alcohol use: No  . Drug use: No  . Sexual activity: Not Currently  Other Topics Concern  . Not on file  Social History Narrative   Married and lives with husband   Has children   Housewife   disabled   Social Determinants of Health   Financial Resource Strain:   . Difficulty of Paying Living Expenses:   Food Insecurity:   . Worried About Charity fundraiser in the Last Year:   . Arboriculturist in the Last Year:   Transportation Needs:   . Film/video editor (Medical):   Marland Kitchen Lack of Transportation (Non-Medical):    Physical Activity:   . Days of Exercise per Week:   . Minutes of Exercise per Session:   Stress:   . Feeling of Stress :   Social Connections:   . Frequency of Communication with Friends and Family:   . Frequency of Social Gatherings with Friends and Family:   . Attends Religious Services:   . Active Member of Clubs or Organizations:   . Attends Archivist Meetings:   Marland Kitchen Marital Status:   Intimate Partner Violence:   . Fear of Current or Ex-Partner:   . Emotionally Abused:   Marland Kitchen Physically Abused:   . Sexually Abused:     Family History:  Family History  Problem Relation Age of Onset  . Emphysema Father   . Diabetes Father   . Emphysema Sister   . Emphysema Brother   . Cancer Mother        Skin Cancer  . Diabetes Mother     Medications:   Current Outpatient Medications on File Prior to Visit  Medication Sig Dispense Refill  . aspirin EC 81 MG EC tablet Take 1 tablet (81 mg total) by mouth daily. 30 tablet 1  . atorvastatin (LIPITOR) 40 MG tablet Take 1 tablet (40 mg total) by mouth daily at 6 PM. 30 tablet 1  . cephALEXin (KEFLEX) 250 MG capsule Take 250 mg by mouth daily.     . clopidogrel (PLAVIX) 75 MG tablet Take 1 tablet (75 mg total) by mouth daily. 21 tablet 0  . ergocalciferol (VITAMIN D2) 50000 UNITS capsule Take 50,000 Units by mouth every Sunday.     . gabapentin (NEURONTIN) 300 MG capsule Take 900 mg by mouth 2 (two) times daily.     Marland Kitchen gemfibrozil (LOPID) 600 MG tablet Take 600 mg by mouth 2 (two) times daily.      . insulin degludec (TRESIBA FLEXTOUCH) 100 UNIT/ML FlexTouch Pen Inject 40 Units into the skin at bedtime.     Marland Kitchen levothyroxine (SYNTHROID) 100 MCG tablet Take 1 tablet (100 mcg total) by mouth daily before breakfast. 30 tablet 1  . metFORMIN (GLUCOPHAGE) 500 MG tablet Take 500 mg by mouth 2 (two) times daily with a meal.    . methocarbamol (ROBAXIN) 500 MG tablet Take 500 mg by mouth in the morning and at bedtime.     Marland Kitchen oxybutynin  (DITROPAN-XL) 10 MG 24 hr tablet Take 10 mg by mouth at bedtime.     Marland Kitchen oxyCODONE  ER (XTAMPZA ER) 9 MG C12A Take 9 mg by mouth every 12 (twelve) hours.    . vitamin B-12 (CYANOCOBALAMIN) 100 MCG tablet Take 100 mcg by mouth daily.     No current facility-administered medications on file prior to visit.    Allergies:   Allergies  Allergen Reactions  . Codeine Nausea Only     Physical Exam  Vitals:   05/07/20 1522  BP: (!) 151/61  Pulse: 69  Weight: 154 lb (69.9 kg)  Height: 5' 1"  (1.549 m)   Body mass index is 29.1 kg/m. No exam data present  General: well developed, well nourished, very pleasant elderly Caucasian female, seated, in no evident distress Head: head normocephalic and atraumatic.   Neck: supple with no carotid or supraclavicular bruits Cardiovascular: regular rate and rhythm, no murmurs Musculoskeletal: no deformity Skin:  no rash/petichiae Vascular:  Normal pulses all extremities   Neurologic Exam Mental Status: Awake and fully alert.  Mild expressive aphasia with great improvement compared to prior visit. Oriented to place and time. Recent and remote memory intact. Attention span, concentration and fund of knowledge appropriate. Mood and affect appropriate.  Cranial Nerves: Pupils equal, briskly reactive to light. Extraocular movements full without nystagmus. Visual fields full to confrontation. Hearing intact. Facial sensation intact.  Mild right nasolabial fold flattening. Motor: Normal bulk and tone.  RUE: 5/5 RLE: 4/5 hip flexor and 5/5 knee extension, knee flexion and ankle dorsiflexion LUE: 5/5 LLE: 4/5 hip flexor  Sensory.: intact to touch , pinprick , position and vibratory sensation.  Coordination: Rapid alternating movements normal in all extremities.  Finger-to-nose performed accurately bilaterally and heel-to-shin difficulty performing due to bilateral hip flexor weakness and history of left hip replacement. Gait and Station: Arises from chair  without difficulty. Stance is normal. Gait demonstrates  normal stride length and balance with use of Rollator walker.   Reflexes: 1+ and symmetric. Toes downgoing.       ASSESSMENT: [name] is a 74 y.o. year old female presented with increasing fatigue, hypoglycemia and right-sided weakness on 12/13/2019 with stroke work-up revealing left basal ganglia infarct secondary to small vessel disease source. Vascular risk factors include recent COVID-19 infection, right carotid stenosis s/p R CEA 03/2020, HTN, HLD, DM with frequent hypoglycemic events, and history of tobacco use.  Residual deficits of mild RLE weakness, and mild expressive aphasia with ongoing improvement    PLAN:  1. Left BG stroke: Continue aspirin 81 mg daily and clopidogrel 75 mg daily  and atorvastatin for secondary stroke prevention.  No indication to continue Plavix at this time from a stroke standpoint but per daughter, was advised by Dr. Donnetta Hutching to continue DAPT until follow-up carotid ultrasound.  Maintain strict control of hypertension with blood pressure goal below 130/90, diabetes with hemoglobin A1c goal below 6.5% and cholesterol with LDL cholesterol (bad cholesterol) goal below 70 mg/dL.  I also advised the patient to eat a healthy diet with plenty of whole grains, cereals, fruits and vegetables, exercise regularly with at least 30 minutes of continuous activity daily and maintain ideal body weight. 2. HTN: Stable.  Continue to follow with PCP for monitoring and management 3. HLD: Continuation of atorvastatin and continue to follow with PCP for prescribing, monitoring and management 4. DMII: Continue to follow with PCP for monitoring and management 5. Right carotid stenosis s/p CEA: Continue to follow with vascular surgery Dr. Donnetta Hutching with repeat carotid duplex 9 months post procedure as well as ongoing use of aspirin and statin  and importance of managing stroke risk factors    Follow up in 6 months or call earlier  if needed   I spent 30 minutes of face-to-face and non-face-to-face time with patient and daughter.  This included previsit chart review, lab review, study review, order entry, electronic health record documentation, patient education and discussion regarding above conditions and answered all questions to patient and daughter satisfaction    Frann Rider, [facility]  [facility] [address] [place], [place] [address]  Phone [phone] Fax [phone] Note: This document was prepared with digital dictation and possible smart phrase technology. Any transcriptional errors that result from this process are unintentional.

## 2020-05-07 NOTE — Patient Instructions (Signed)
Continue aspirin 81 mg daily and clopidogrel 75 mg daily  and lipitor  for secondary stroke prevention  Continue to follow up with PCP regarding cholesterol and blood pressure management   Continue to monitor blood pressure at home  Maintain strict control of hypertension with blood pressure goal below 130/90, diabetes with hemoglobin A1c goal below 6.5% and cholesterol with LDL cholesterol (bad cholesterol) goal below 70 mg/dL. I also advised the patient to eat a healthy diet with plenty of whole grains, cereals, fruits and vegetables, exercise regularly and maintain ideal body weight.  Followup in the future with me in 6 months or call earlier if needed       Thank you for coming to see [place] at [facility]. I hope we have been able to provide you high quality care today.  You may receive a patient satisfaction survey over the next few weeks. We would appreciate your feedback and comments so that we may continue to improve ourselves and the health of our patients.

## 2020-05-07 NOTE — Progress Notes (Signed)
I agree with the above plan 

## 2020-06-06 ENCOUNTER — Other Ambulatory Visit: Payer: Self-pay

## 2020-06-06 ENCOUNTER — Ambulatory Visit: Payer: Medicare HMO | Admitting: Podiatry

## 2020-06-06 ENCOUNTER — Encounter: Payer: Self-pay | Admitting: Podiatry

## 2020-06-06 DIAGNOSIS — M79674 Pain in right toe(s): Secondary | ICD-10-CM

## 2020-06-06 DIAGNOSIS — E1142 Type 2 diabetes mellitus with diabetic polyneuropathy: Secondary | ICD-10-CM

## 2020-06-06 DIAGNOSIS — M21372 Foot drop, left foot: Secondary | ICD-10-CM

## 2020-06-06 DIAGNOSIS — B351 Tinea unguium: Secondary | ICD-10-CM

## 2020-06-06 DIAGNOSIS — L84 Corns and callosities: Secondary | ICD-10-CM | POA: Diagnosis not present

## 2020-06-06 DIAGNOSIS — M21371 Foot drop, right foot: Secondary | ICD-10-CM

## 2020-06-06 DIAGNOSIS — M79675 Pain in left toe(s): Secondary | ICD-10-CM

## 2020-06-10 NOTE — Progress Notes (Addendum)
Subjective: [name] presents today preventative diabetic foot care with h/o preulcerative callus(es) left hallux and painful mycotic toenails b/l that are difficult to trim. Pain interferes with ambulation. Aggravating factors include wearing enclosed shoe gear. Pain is relieved with periodic professional debridement.   She states she has recovered from her right sided CEA she had performed in May. She voices no new pedal problems on today's visit.  [name] Beals, NP is patient's PCP. Last visit was: 12/28/2019.  Past Medical History:  Diagnosis Date  . Arthritis   . Basal ganglia stroke ([place])   . Bilateral lower extremity edema   . Cancer ([place])   . Carotid artery occlusion   . Flank pain   . Full dentures   . History of cervical cancer    11/ 1994  Stage IB  s/p  high dose radiation brachytherapy @ [facility] 01/ 1995--- per pt no recurrence  . History of chronic bronchitis   . History of hyperthyroidism    d 03/ 2011---due to grave's disease--- s/p  RAI treatment 04/ 2011  . History of kidney stones   . History of sepsis 05/03/2018   due to UTI with klebsiella/ pyelonephritis/ ureteral obstruction cause by stone  . Hyperlipidemia   . Hypothyroidism, postradioiodine therapy    endocrinologist--  dr Loanne Drilling--  dx graves disease and s/p RAI i131 treatement 4/ 2011  . Nauseated   . Oral thrush 05/03/2018  . Pneumonia   . Type 2 diabetes mellitus treated with insulin ([place])    FOLLOWED BY PCP  . Urgency of urination   . Wears glasses      Current Outpatient Medications on File Prior to Visit  Medication Sig Dispense Refill  . aspirin EC 81 MG EC tablet Take 1 tablet (81 mg total) by mouth daily. 30 tablet 1  . atorvastatin (LIPITOR) 40 MG tablet Take 1 tablet (40 mg total) by mouth daily at 6 PM. 30 tablet 1  . cephALEXin (KEFLEX) 250 MG capsule Take 250 mg by mouth daily.     . clopidogrel (PLAVIX) 75 MG tablet Take 1 tablet (75 mg total) by mouth daily. 21 tablet 0  .  ergocalciferol (VITAMIN D2) 50000 UNITS capsule Take 50,000 Units by mouth every Sunday.     . fluconazole (DIFLUCAN) 150 MG tablet Take 150 mg by mouth daily.    Marland Kitchen gabapentin (NEURONTIN) 300 MG capsule Take 900 mg by mouth 2 (two) times daily.     Marland Kitchen gemfibrozil (LOPID) 600 MG tablet Take 600 mg by mouth 2 (two) times daily.      . insulin degludec (TRESIBA FLEXTOUCH) 100 UNIT/ML FlexTouch Pen Inject 40 Units into the skin at bedtime.     Marland Kitchen levothyroxine (SYNTHROID) 100 MCG tablet Take 1 tablet (100 mcg total) by mouth daily before breakfast. 30 tablet 1  . metFORMIN (GLUCOPHAGE) 500 MG tablet Take 500 mg by mouth 2 (two) times daily with a meal.    . methocarbamol (ROBAXIN) 500 MG tablet Take 500 mg by mouth in the morning and at bedtime.     Marland Kitchen nystatin-triamcinolone (MYCOLOG II) cream SMARTSIG:Topical Morning-Evening    . oxybutynin (DITROPAN-XL) 10 MG 24 hr tablet Take 10 mg by mouth at bedtime.     Marland Kitchen oxyCODONE ER (XTAMPZA ER) 9 MG C12A Take 9 mg by mouth every 12 (twelve) hours.    . vitamin B-12 (CYANOCOBALAMIN) 100 MCG tablet Take 100 mcg by mouth daily.     No current facility-administered medications on file  prior to visit.     Allergies  Allergen Reactions  . Codeine Nausea Only    Objective: [name] is a pleasant 74 y.o. Caucasian female in NAD. AAO x 3.   There were no vitals filed for this visit.  Vascular Examination: Neurovascular status unchanged b/l. Capillary fill time to digits <3 seconds b/l. Palpable DP pulses b/l. Palpable PT pulses b/l. Pedal hair absent b/l Skin temperature gradient within normal limits b/l.  Dermatological Examination: Pedal skin with normal turgor, texture and tone bilaterally. No open wounds bilaterally. No interdigital macerations bilaterally. Toenails 1-5 b/l elongated, dystrophic, thickened, crumbly with subungual debris and tenderness to dorsal palpation. Porokeratotic lesion(s) plantar IPJ of L hallux with subdermal hemorrhage. No  erythema, no edema, no drainage, no flocculence. Hyperkeratotic lesion submet head 5 left foot with tenderness to palpation. No edema, no erythema, no drainage, no flocculence.  Musculoskeletal: Normal muscle strength 5/5 to all lower extremity muscle groups bilaterally for plantar flexion, inversion and eversion. Dropfoot present b/l lower extremities. No pain crepitus or joint limitation noted with ROM b/l. Hammertoes noted to the L 2nd toe and R 2nd toe.  Neurological Examination: Protective sensation diminished with 10g monofilament b/l. Vibratory sensation decreased b/l.  Assessment: 1. Pain due to onychomycosis of toenails of both feet   2. Pre-ulcerative calluses   3. Callus   4. Bilateral foot-drop   5. Diabetic peripheral neuropathy associated with type 2 diabetes mellitus ([place])     Plan: -Examined patient. -No new findings. No new orders. -Continue diabetic foot care principles. -Toenails 1-5 b/l were debrided in length and girth with sterile nail nippers and dremel without iatrogenic bleeding.  -Callus(es) submet head 5 left foot pared utilizing sterile scalpel blade without complication or incident. Total number debrided =1. -Painful porokeratotic lesion(s) L hallux pared and enucleated with sterile scalpel blade without incident. -Patient to report any pedal injuries to medical professional immediately. -She is to be seen and measured for diabetic shoes today. Patient will also benefit from AFO braces to assist her with b/l foot drop, thus, preventing falls. Will have her assessed by Pedorthist for this today as well. -Patient to continue soft, supportive shoe gear daily. -Patient/POA to call should there be question/concern in the interim.  Return in about 9 weeks (around 08/08/2020) for diabetic nail and callus trim.  [name], DPM

## 2020-06-12 ENCOUNTER — Ambulatory Visit: Payer: Medicare HMO | Admitting: Podiatry

## 2020-09-07 ENCOUNTER — Encounter: Payer: Self-pay | Admitting: Podiatry

## 2020-09-07 ENCOUNTER — Ambulatory Visit: Payer: Medicare HMO | Admitting: Podiatry

## 2020-09-07 ENCOUNTER — Other Ambulatory Visit: Payer: Self-pay

## 2020-09-07 VITALS — Temp 99.2°F

## 2020-09-07 DIAGNOSIS — B3731 Acute candidiasis of vulva and vagina: Secondary | ICD-10-CM | POA: Insufficient documentation

## 2020-09-07 DIAGNOSIS — L84 Corns and callosities: Secondary | ICD-10-CM | POA: Diagnosis not present

## 2020-09-07 DIAGNOSIS — M2041 Other hammer toe(s) (acquired), right foot: Secondary | ICD-10-CM

## 2020-09-07 DIAGNOSIS — G629 Polyneuropathy, unspecified: Secondary | ICD-10-CM | POA: Insufficient documentation

## 2020-09-07 DIAGNOSIS — M79674 Pain in right toe(s): Secondary | ICD-10-CM

## 2020-09-07 DIAGNOSIS — B373 Candidiasis of vulva and vagina: Secondary | ICD-10-CM | POA: Insufficient documentation

## 2020-09-07 DIAGNOSIS — L03115 Cellulitis of right lower limb: Secondary | ICD-10-CM

## 2020-09-07 DIAGNOSIS — M2042 Other hammer toe(s) (acquired), left foot: Secondary | ICD-10-CM

## 2020-09-07 DIAGNOSIS — E1142 Type 2 diabetes mellitus with diabetic polyneuropathy: Secondary | ICD-10-CM | POA: Diagnosis not present

## 2020-09-07 DIAGNOSIS — R062 Wheezing: Secondary | ICD-10-CM | POA: Insufficient documentation

## 2020-09-07 DIAGNOSIS — M79675 Pain in left toe(s): Secondary | ICD-10-CM

## 2020-09-07 DIAGNOSIS — E559 Vitamin D deficiency, unspecified: Secondary | ICD-10-CM | POA: Insufficient documentation

## 2020-09-07 DIAGNOSIS — B351 Tinea unguium: Secondary | ICD-10-CM

## 2020-09-07 DIAGNOSIS — G4700 Insomnia, unspecified: Secondary | ICD-10-CM | POA: Insufficient documentation

## 2020-09-07 DIAGNOSIS — J189 Pneumonia, unspecified organism: Secondary | ICD-10-CM | POA: Insufficient documentation

## 2020-09-09 NOTE — Progress Notes (Addendum)
Subjective: [name] presents today preventative diabetic foot care with h/o preulcerative callus(es) left hallux and painful mycotic toenails b/l that are difficult to trim. Pain interferes with ambulation. Aggravating factors include wearing enclosed shoe gear. Pain is relieved with periodic professional debridement.   She states her right leg is swollen. Denies any calf pain.   [name] Beals, NP is patient's PCP. Last visit was: 06/06/2020.  Past Medical History:  Diagnosis Date  . Arthritis   . Basal ganglia stroke ([place])   . Bilateral lower extremity edema   . Cancer ([place])   . Carotid artery occlusion   . Flank pain   . Full dentures   . History of cervical cancer    11/ 1994  Stage IB  s/p  high dose radiation brachytherapy @ [facility] 01/ 1995--- per pt no recurrence  . History of chronic bronchitis   . History of hyperthyroidism    d 03/ 2011---due to grave's disease--- s/p  RAI treatment 04/ 2011  . History of kidney stones   . History of sepsis 05/03/2018   due to UTI with klebsiella/ pyelonephritis/ ureteral obstruction cause by stone  . Hyperlipidemia   . Hypothyroidism, postradioiodine therapy    endocrinologist--  dr Loanne Drilling--  dx graves disease and s/p RAI i131 treatement 4/ 2011  . Nauseated   . Oral thrush 05/03/2018  . Pneumonia   . Type 2 diabetes mellitus treated with insulin ([place])    FOLLOWED BY PCP  . Urgency of urination   . Wears glasses      Current Outpatient Medications on File Prior to Visit  Medication Sig Dispense Refill  . aspirin EC 81 MG EC tablet Take 1 tablet (81 mg total) by mouth daily. 30 tablet 1  . atorvastatin (LIPITOR) 40 MG tablet Take 1 tablet (40 mg total) by mouth daily at 6 PM. 30 tablet 1  . cephALEXin (KEFLEX) 250 MG capsule Take 250 mg by mouth daily.     . clopidogrel (PLAVIX) 75 MG tablet Take 1 tablet (75 mg total) by mouth daily. 21 tablet 0  . ergocalciferol (VITAMIN D2) 50000 UNITS capsule Take 50,000 Units by  mouth every Sunday.     . fluconazole (DIFLUCAN) 150 MG tablet Take 150 mg by mouth daily.    Marland Kitchen gabapentin (NEURONTIN) 300 MG capsule Take 900 mg by mouth 2 (two) times daily.     Marland Kitchen gemfibrozil (LOPID) 600 MG tablet Take 600 mg by mouth 2 (two) times daily.      . insulin degludec (TRESIBA FLEXTOUCH) 100 UNIT/ML FlexTouch Pen Inject 40 Units into the skin at bedtime.     Marland Kitchen levothyroxine (SYNTHROID) 100 MCG tablet Take 1 tablet (100 mcg total) by mouth daily before breakfast. 30 tablet 1  . metFORMIN (GLUCOPHAGE) 500 MG tablet Take 500 mg by mouth 2 (two) times daily with a meal.    . methocarbamol (ROBAXIN) 500 MG tablet Take 500 mg by mouth in the morning and at bedtime.     Marland Kitchen nystatin-triamcinolone (MYCOLOG II) cream SMARTSIG:Topical Morning-Evening    . oxybutynin (DITROPAN-XL) 10 MG 24 hr tablet Take 10 mg by mouth at bedtime.     Marland Kitchen oxyCODONE ER (XTAMPZA ER) 9 MG C12A Take 9 mg by mouth every 12 (twelve) hours.    . vitamin B-12 (CYANOCOBALAMIN) 100 MCG tablet Take 100 mcg by mouth daily.     No current facility-administered medications on file prior to visit.     Allergies  Allergen Reactions  .  Codeine Nausea Only    Objective: TAELOR [name] is a pleasant 74 y.o. Caucasian female in NAD. AAO x 3.   Vitals:   09/07/20 1641  Temp: 99.2 F (37.3 C)    Vascular Examination: Neurovascular status unchanged b/l lower extremities. Capillary fill time to digits <3 seconds b/l lower extremities. Palpable pedal pulses b/l LE. Pedal hair absent. Lower extremity skin temperature gradient within normal limits. No pain with calf compression b/l. Unilateral edema right LE. RLE slight erythematous and warm.      Dermatological Examination: Pedal skin with normal turgor, texture and tone bilaterally. No open wounds bilaterally. No interdigital macerations bilaterally. Toenails 1-5 b/l elongated, dystrophic, thickened, crumbly with subungual debris and tenderness to dorsal palpation.  Porokeratotic lesion(s) plantar IPJ of L hallux with subdermal hemorrhage. No erythema, no edema, no drainage, no flocculence. Hyperkeratotic lesion submet head 5 left foot with tenderness to palpation. No edema, no erythema, no drainage, no flocculence.  Musculoskeletal: Normal muscle strength 5/5 to all lower extremity muscle groups bilaterally for plantar flexion, inversion and eversion. Dropfoot present b/l lower extremities. No pain crepitus or joint limitation noted with ROM b/l. Hammertoes noted to the L 2nd toe and R 2nd toe.  Neurological Examination: Protective sensation diminished with 10g monofilament b/l. Vibratory sensation decreased b/l.  Assessment: 1. Pain due to onychomycosis of toenails of both feet   2. Pre-ulcerative calluses   3. Cellulitis of right lower extremity   4. Acquired hammertoes of both feet   5. Diabetic peripheral neuropathy associated with type 2 diabetes mellitus ([place])     Plan: -Examined patient. Discussed concern for cellulitis RLE with patient. LVM for her daughter, Janetta Hora at 16:29,  informing her of findings. Also phoned [facility] and left voicemail for [employer] at 16:25. Mr. Comunale stated he would take his wife to [facility] and let someone assess the leg.  -No new findings. No new orders. -Continue diabetic foot care principles. -Toenails 1-5 b/l were debrided in length and girth with sterile nail nippers and dremel without iatrogenic bleeding.  -Callus(es) submet head 5 left foot pared utilizing sterile scalpel blade without complication or incident. Total number debrided =1. -Painful porokeratotic lesion(s) L hallux pared and enucleated with sterile scalpel blade without incident. -Patient to report any pedal injuries to medical professional immediately. -Patient to continue soft, supportive shoe gear daily. -Patient/POA to call should there be question/concern in the interim.  Return in about 9 weeks  (around 11/09/2020).  [name], DPM

## 2020-09-30 ENCOUNTER — Emergency Department (HOSPITAL_COMMUNITY): Payer: Medicare HMO

## 2020-09-30 ENCOUNTER — Inpatient Hospital Stay (HOSPITAL_COMMUNITY): Payer: Medicare HMO

## 2020-09-30 ENCOUNTER — Observation Stay (HOSPITAL_COMMUNITY)
Admission: EM | Admit: 2020-09-30 | Discharge: 2020-10-01 | Disposition: A | Discharge status: Home / Self Care | Payer: Medicare HMO | Attending: Student in an Organized Health Care Education/Training Program | Admitting: Student in an Organized Health Care Education/Training Program

## 2020-09-30 ENCOUNTER — Other Ambulatory Visit: Payer: Self-pay

## 2020-09-30 ENCOUNTER — Encounter (HOSPITAL_COMMUNITY): Payer: Self-pay | Admitting: Radiology

## 2020-09-30 DIAGNOSIS — Z20822 Contact with and (suspected) exposure to covid-19: Secondary | ICD-10-CM | POA: Insufficient documentation

## 2020-09-30 DIAGNOSIS — R739 Hyperglycemia, unspecified: Secondary | ICD-10-CM

## 2020-09-30 DIAGNOSIS — Z7982 Long term (current) use of aspirin: Secondary | ICD-10-CM | POA: Insufficient documentation

## 2020-09-30 DIAGNOSIS — Z794 Long term (current) use of insulin: Secondary | ICD-10-CM | POA: Insufficient documentation

## 2020-09-30 DIAGNOSIS — G629 Polyneuropathy, unspecified: Secondary | ICD-10-CM

## 2020-09-30 DIAGNOSIS — J449 Chronic obstructive pulmonary disease, unspecified: Secondary | ICD-10-CM | POA: Insufficient documentation

## 2020-09-30 DIAGNOSIS — I6523 Occlusion and stenosis of bilateral carotid arteries: Secondary | ICD-10-CM | POA: Diagnosis not present

## 2020-09-30 DIAGNOSIS — E1165 Type 2 diabetes mellitus with hyperglycemia: Secondary | ICD-10-CM | POA: Diagnosis not present

## 2020-09-30 DIAGNOSIS — G929 Unspecified toxic encephalopathy: Secondary | ICD-10-CM | POA: Insufficient documentation

## 2020-09-30 DIAGNOSIS — E119 Type 2 diabetes mellitus without complications: Secondary | ICD-10-CM

## 2020-09-30 DIAGNOSIS — Z96642 Presence of left artificial hip joint: Secondary | ICD-10-CM | POA: Insufficient documentation

## 2020-09-30 DIAGNOSIS — E039 Hypothyroidism, unspecified: Secondary | ICD-10-CM | POA: Insufficient documentation

## 2020-09-30 DIAGNOSIS — Z8616 Personal history of COVID-19: Secondary | ICD-10-CM | POA: Diagnosis not present

## 2020-09-30 DIAGNOSIS — G9341 Metabolic encephalopathy: Secondary | ICD-10-CM | POA: Diagnosis present

## 2020-09-30 DIAGNOSIS — G9340 Encephalopathy, unspecified: Principal | ICD-10-CM | POA: Insufficient documentation

## 2020-09-30 DIAGNOSIS — Z87891 Personal history of nicotine dependence: Secondary | ICD-10-CM | POA: Diagnosis not present

## 2020-09-30 DIAGNOSIS — Z79899 Other long term (current) drug therapy: Secondary | ICD-10-CM | POA: Insufficient documentation

## 2020-09-30 DIAGNOSIS — Z8541 Personal history of malignant neoplasm of cervix uteri: Secondary | ICD-10-CM | POA: Diagnosis not present

## 2020-09-30 DIAGNOSIS — I6930 Unspecified sequelae of cerebral infarction: Secondary | ICD-10-CM | POA: Diagnosis not present

## 2020-09-30 DIAGNOSIS — R519 Headache, unspecified: Secondary | ICD-10-CM | POA: Diagnosis present

## 2020-09-30 LAB — COMPREHENSIVE METABOLIC PANEL
ALT: 12 U/L (ref 0–44)
AST: 16 U/L (ref 15–41)
Albumin: 3.3 g/dL — ABNORMAL LOW (ref 3.5–5.0)
Alkaline Phosphatase: 130 U/L — ABNORMAL HIGH (ref 38–126)
Anion gap: 15 (ref 5–15)
BUN: 14 mg/dL (ref 8–23)
CO2: 23 mmol/L (ref 22–32)
Calcium: 8.6 mg/dL — ABNORMAL LOW (ref 8.9–10.3)
Chloride: 98 mmol/L (ref 98–111)
Creatinine, Ser: 0.9 mg/dL (ref 0.44–1.00)
GFR, Estimated: >60 mL/min (ref >60)
Glucose, Bld: 372 mg/dL — ABNORMAL HIGH (ref 70–99)
Potassium: 4.1 mmol/L (ref 3.5–5.1)
Sodium: 136 mmol/L (ref 135–145)
Total Bilirubin: 0.6 mg/dL (ref 0.3–1.2)
Total Protein: 6.4 g/dL — ABNORMAL LOW (ref 6.5–8.1)

## 2020-09-30 LAB — I-STAT VENOUS BLOOD GAS, ED
Acid-Base Excess: 4 mmol/L — ABNORMAL HIGH (ref 0.0–2.0)
Bicarbonate: 27.4 mmol/L (ref 20.0–28.0)
Calcium, Ion: 0.97 mmol/L — ABNORMAL LOW (ref 1.15–1.40)
HCT: 38 % (ref 36.0–46.0)
Hemoglobin: 12.9 g/dL (ref 12.0–15.0)
O2 Saturation: 77 %
Potassium: 4.1 mmol/L (ref 3.5–5.1)
Sodium: 137 mmol/L (ref 135–145)
TCO2: 28 mmol/L (ref 22–32)
pCO2, Ven: 36.4 mmHg — ABNORMAL LOW (ref 44.0–60.0)
pH, Ven: 7.485 — ABNORMAL HIGH (ref 7.250–7.430)
pO2, Ven: 39 mmHg (ref 32.0–45.0)

## 2020-09-30 LAB — URINALYSIS, ROUTINE W REFLEX MICROSCOPIC
Bacteria, UA: NONE SEEN
Bilirubin Urine: NEGATIVE
Glucose, UA: >=500 mg/dL — AB
Hgb urine dipstick: NEGATIVE
Ketones, ur: NEGATIVE mg/dL
Nitrite: NEGATIVE
Protein, ur: NEGATIVE mg/dL
Specific Gravity, Urine: 1.044 — ABNORMAL HIGH (ref 1.005–1.030)
pH: 6 (ref 5.0–8.0)

## 2020-09-30 LAB — RESPIRATORY PANEL BY RT PCR (FLU A&B, COVID)
Influenza A by PCR: NEGATIVE
Influenza B by PCR: NEGATIVE
SARS Coronavirus 2 by RT PCR: NEGATIVE

## 2020-09-30 LAB — DIFFERENTIAL
Abs Immature Granulocytes: 0.03 10*3/uL (ref 0.00–0.07)
Basophils Absolute: 0 10*3/uL (ref 0.0–0.1)
Basophils Relative: 1 %
Eosinophils Absolute: 0.1 10*3/uL (ref 0.0–0.5)
Eosinophils Relative: 2 %
Immature Granulocytes: 1 %
Lymphocytes Relative: 17 %
Lymphs Abs: 1.1 10*3/uL (ref 0.7–4.0)
Monocytes Absolute: 0.5 10*3/uL (ref 0.1–1.0)
Monocytes Relative: 7 %
Neutro Abs: 4.8 10*3/uL (ref 1.7–7.7)
Neutrophils Relative %: 72 %

## 2020-09-30 LAB — CBC
HCT: 39.8 % (ref 36.0–46.0)
Hemoglobin: 11.9 g/dL — ABNORMAL LOW (ref 12.0–15.0)
MCH: 26 pg (ref 26.0–34.0)
MCHC: 29.9 g/dL — ABNORMAL LOW (ref 30.0–36.0)
MCV: 86.9 fL (ref 80.0–100.0)
Platelets: 201 10*3/uL (ref 150–400)
RBC: 4.58 MIL/uL (ref 3.87–5.11)
RDW: 14.2 % (ref 11.5–15.5)
WBC: 6.5 10*3/uL (ref 4.0–10.5)
nRBC: 0 % (ref 0.0–0.2)

## 2020-09-30 LAB — I-STAT CHEM 8, ED
BUN: 16 mg/dL (ref 8–23)
Calcium, Ion: 1.03 mmol/L — ABNORMAL LOW (ref 1.15–1.40)
Chloride: 100 mmol/L (ref 98–111)
Creatinine, Ser: 0.8 mg/dL (ref 0.44–1.00)
Glucose, Bld: 358 mg/dL — ABNORMAL HIGH (ref 70–99)
HCT: 40 % (ref 36.0–46.0)
Hemoglobin: 13.6 g/dL (ref 12.0–15.0)
Potassium: 4.2 mmol/L (ref 3.5–5.1)
Sodium: 138 mmol/L (ref 135–145)
TCO2: 26 mmol/L (ref 22–32)

## 2020-09-30 LAB — CBG MONITORING, ED
Glucose-Capillary: 306 mg/dL — ABNORMAL HIGH (ref 70–99)
Glucose-Capillary: 266 mg/dL — ABNORMAL HIGH (ref 70–99)

## 2020-09-30 LAB — PROTIME-INR
INR: 1.1 (ref 0.8–1.2)
Prothrombin Time: 13.8 seconds (ref 11.4–15.2)

## 2020-09-30 LAB — APTT: aPTT: 29 seconds (ref 24–36)

## 2020-09-30 LAB — BETA-HYDROXYBUTYRIC ACID: Beta-Hydroxybutyric Acid: 0.92 mmol/L — ABNORMAL HIGH (ref 0.05–0.27)

## 2020-09-30 MED ORDER — ACETAMINOPHEN 325 MG PO TABS
650.0000 mg | ORAL_TABLET | Freq: Four times a day (QID) | ORAL | Status: DC | PRN
  Administered 2020-10-01: 650 mg via ORAL
  Filled 2020-09-30: qty 2

## 2020-09-30 MED ORDER — LEVOTHYROXINE SODIUM 100 MCG PO TABS
100.0000 ug | ORAL_TABLET | Freq: Every day | ORAL | Status: DC
  Administered 2020-10-01: 100 ug via ORAL
  Filled 2020-09-30: qty 1

## 2020-09-30 MED ORDER — CLOPIDOGREL BISULFATE 75 MG PO TABS
75.0000 mg | ORAL_TABLET | Freq: Every day | ORAL | Status: DC
  Administered 2020-10-01: 75 mg via ORAL
  Filled 2020-09-30: qty 1

## 2020-09-30 MED ORDER — ACETAMINOPHEN 650 MG RE SUPP: 650.0000 mg | Freq: Four times a day (QID) | RECTAL | Status: DC | PRN

## 2020-09-30 MED ORDER — OXYCODONE HCL 5 MG PO TABS
5.0000 mg | ORAL_TABLET | Freq: Once | ORAL | Status: AC
  Administered 2020-09-30: 5 mg via ORAL
  Filled 2020-09-30: qty 1

## 2020-09-30 MED ORDER — SENNOSIDES-DOCUSATE SODIUM 8.6-50 MG PO TABS: 1.0000 | ORAL_TABLET | Freq: Every evening | ORAL | Status: DC | PRN

## 2020-09-30 MED ORDER — INSULIN GLARGINE 100 UNIT/ML ~~LOC~~ SOLN
20.0000 [IU] | Freq: Every day | SUBCUTANEOUS | Status: DC
  Administered 2020-09-30: 20 [IU] via SUBCUTANEOUS
  Filled 2020-09-30 (×3): qty 0.2

## 2020-09-30 MED ORDER — IOHEXOL 350 MG/ML SOLN
100.0000 mL | Freq: Once | INTRAVENOUS | Status: AC | PRN
  Administered 2020-09-30: 100 mL via INTRAVENOUS

## 2020-09-30 MED ORDER — ASPIRIN 81 MG PO TBEC: 81.0000 mg | DELAYED_RELEASE_TABLET | Freq: Every day | ORAL | Status: DC

## 2020-09-30 MED ORDER — ENOXAPARIN SODIUM 40 MG/0.4ML ~~LOC~~ SOLN
40.0000 mg | SUBCUTANEOUS | Status: DC
  Administered 2020-09-30: 40 mg via SUBCUTANEOUS
  Filled 2020-09-30: qty 0.4

## 2020-09-30 MED ORDER — ASPIRIN EC 81 MG PO TBEC: 81.0000 mg | DELAYED_RELEASE_TABLET | Freq: Every day | ORAL | Status: DC

## 2020-09-30 MED ORDER — LORAZEPAM 2 MG/ML IJ SOLN
0.5000 mg | Freq: Once | INTRAMUSCULAR | Status: AC | PRN
  Administered 2020-09-30: 0.5 mg via INTRAVENOUS
  Filled 2020-09-30: qty 1

## 2020-09-30 MED ORDER — ASPIRIN EC 325 MG PO TBEC
325.0000 mg | DELAYED_RELEASE_TABLET | Freq: Once | ORAL | Status: AC
  Administered 2020-09-30: 325 mg via ORAL
  Filled 2020-09-30: qty 1

## 2020-09-30 MED ORDER — LOSARTAN POTASSIUM 25 MG PO TABS
25.0000 mg | ORAL_TABLET | Freq: Every day | ORAL | Status: DC
  Administered 2020-09-30 – 2020-10-01 (×2): 25 mg via ORAL
  Filled 2020-09-30 (×2): qty 1

## 2020-09-30 MED ORDER — SODIUM CHLORIDE 0.9% FLUSH
3.0000 mL | Freq: Once | INTRAVENOUS | Status: DC
Start: 2020-09-30 — End: 2020-10-01

## 2020-09-30 MED ORDER — PROMETHAZINE HCL 12.5 MG PO TABS
12.5000 mg | ORAL_TABLET | Freq: Four times a day (QID) | ORAL | Status: DC | PRN
  Filled 2020-09-30: qty 1

## 2020-09-30 MED ORDER — ATORVASTATIN CALCIUM 40 MG PO TABS
40.0000 mg | ORAL_TABLET | Freq: Every day | ORAL | Status: DC
  Administered 2020-09-30: 40 mg via ORAL
  Filled 2020-09-30: qty 1

## 2020-09-30 MED ORDER — SODIUM CHLORIDE 0.9% FLUSH
3.0000 mL | Freq: Two times a day (BID) | INTRAVENOUS | Status: DC
  Administered 2020-09-30: 3 mL via INTRAVENOUS

## 2020-09-30 MED ORDER — LACTATED RINGERS IV SOLN: INTRAVENOUS | Status: DC

## 2020-09-30 NOTE — Consult Note (Signed)
Neurology Consult H&P  CC: speech difficulty and confusion  History is obtained from: EMS and chart  HPI: [name] is a 74 y.o. female right handed previous stroke March 2021 noted to be confused and with difficulty speaking onset 0800. There was associated severe headache.  Blood glucose in the field: 384  Report is that the patient fell last week and is on aspirin.  On arrival the patient was encephalopathic, moaning in pain and not following commands consistently.  LKW: 0800 tpa given?: No, not a candidate IR Thrombectomy? No {Modified Rankin unclear NIHSS: 8   ROS: Unable to assess due to altered mental status.   Past Medical History:  Diagnosis Date  . Arthritis   . Basal ganglia stroke ([place])   . Bilateral lower extremity edema   . Cancer ([place])   . Carotid artery occlusion   . Flank pain   . Full dentures   . History of cervical cancer    11/ 1994  Stage IB  s/p  high dose radiation brachytherapy @ [facility] 01/ 1995--- per pt no recurrence  . History of chronic bronchitis   . History of hyperthyroidism    d 03/ 2011---due to grave's disease--- s/p  RAI treatment 04/ 2011  . History of kidney stones   . History of sepsis 05/03/2018   due to UTI with klebsiella/ pyelonephritis/ ureteral obstruction cause by stone  . Hyperlipidemia   . Hypothyroidism, postradioiodine therapy    endocrinologist--  dr Loanne Drilling--  dx graves disease and s/p RAI i131 treatement 4/ 2011  . Nauseated   . Oral thrush 05/03/2018  . Pneumonia   . Type 2 diabetes mellitus treated with insulin ([place])    FOLLOWED BY PCP  . Urgency of urination   . Wears glasses    Family History  Problem Relation Age of Onset  . Emphysema Father   . Diabetes Father   . Emphysema Sister   . Emphysema Brother   . Cancer Mother        Skin Cancer  . Diabetes Mother     Social History:  reports that she quit smoking about 38 years ago. Her smoking use included cigarettes. She quit after 5.00 years of  use. She has never used smokeless tobacco. She reports that she does not drink alcohol and does not use drugs.   Prior to Admission medications   Medication Sig Start Date End Date Taking? Authorizing Provider  aspirin EC 81 MG EC tablet Take 1 tablet (81 mg total) by mouth daily. 12/16/19   Hosie Poisson, MD  atorvastatin (LIPITOR) 40 MG tablet Take 1 tablet (40 mg total) by mouth daily at 6 PM. 12/15/19   Hosie Poisson, MD  cephALEXin (KEFLEX) 250 MG capsule Take 250 mg by mouth daily.     [provider]  clopidogrel (PLAVIX) 75 MG tablet Take 1 tablet (75 mg total) by mouth daily. 12/16/19   Hosie Poisson, MD  ergocalciferol (VITAMIN D2) 50000 UNITS capsule Take 50,000 Units by mouth every Sunday.     [provider]  fluconazole (DIFLUCAN) 150 MG tablet Take 150 mg by mouth daily. 05/02/20   [provider]  gabapentin (NEURONTIN) 300 MG capsule Take 900 mg by mouth 2 (two) times daily.     [provider]  gemfibrozil (LOPID) 600 MG tablet Take 600 mg by mouth 2 (two) times daily.      [provider]  insulin degludec (TRESIBA FLEXTOUCH) 100 UNIT/ML FlexTouch Pen Inject 40  Units into the skin at bedtime.     [provider]  levothyroxine (SYNTHROID) 100 MCG tablet Take 1 tablet (100 mcg total) by mouth daily before breakfast. 12/16/19   Hosie Poisson, MD  metFORMIN (GLUCOPHAGE) 500 MG tablet Take 500 mg by mouth 2 (two) times daily with a meal.    [provider]  methocarbamol (ROBAXIN) 500 MG tablet Take 500 mg by mouth in the morning and at bedtime.  02/22/20   [provider]  nystatin-triamcinolone (MYCOLOG II) cream SMARTSIG:Topical Morning-Evening 05/02/20   [provider]  oxybutynin (DITROPAN-XL) 10 MG 24 hr tablet Take 10 mg by mouth at bedtime.  07/26/19   [provider]  oxyCODONE ER (XTAMPZA ER) 9 MG C12A Take 9 mg by mouth every 12 (twelve) hours.    [provider]  vitamin B-12  (CYANOCOBALAMIN) 100 MCG tablet Take 100 mcg by mouth daily.    [provider]    Exam: Current vital signs: Wt 68.9 kg   BMI 28.70 kg/m   Physical Exam  Constitutional: Appears well-developed and well-nourished.  Psych: Unable to assess as she was encephalopathic, moaning in pain and not following commands consistently. Eyes: No scleral injection HENT: No OP obstrucion Head: Normocephalic.  Cardiovascular: Normal rate and regular rhythm.  Respiratory: Effort normal and breath sounds normal to anterior ascultation GI: Soft.  No distension. There is no tenderness.  Skin: WDI  Neuro: Mental Status: Patient is awake, alert, inconsistently answering name handedness to person, year. No signs of aphasia or neglect. Cranial Nerves: II: Visual Fields are full. Pupils are equal, round, and reactive to light. III,IV, VI: EOMI without ptosis or diploplia.  V: Facial sensation is symmetric to temperature VII: Facial movement is symmetric.  VIII: hearing is intact to voice X: Uvula elevates symmetrically XI: Shoulder shrug is symmetric. XII: tongue is midline without atrophy or fasciculations.  Motor: Tone is normal. Bulk is normal. Good strength in all extremities but not following commands consistently. Sensory: Sensation is symmetric to light touch and temperature in the arms and legs. Deep Tendon Reflexes: 2+ and symmetric in the biceps and patellae. Plantars: Toes are downgoing bilaterally. Cerebellar: FNF and HKS are intact bilaterally.  I have reviewed labs in epic and the pertinent results are: Glucose 358  I have reviewed the images obtained: NCT head showed no acute ischemic changes previous lacunar infarction left basal ganglia. CTA head and neck showed no acute large or medium vessel occlusion.  Negative perfusion study. 30% stenoses at left common carotid artery and left subclavian artery origins. Previous right carotid endarterectomy with wide patency at  the endarterectomy site. Atherosclerotic plaque of both common carotid arteries. Minimal diameter on the right proximal to the endarterectomy site is 2.3 mm. Minimal diameter on the left in the midportion is 3.3 mm.  Assessment: [name] is a 74 y.o. female PMHx recent stroke with encephalopathy and extensive bilateral carotid artery disease.  Recommended aspirin 329m now.  Impression:  Encephalopathy. Significant extensive bilateral carotid artery stenoses with left ICA showing soft plaque.   Plan: - MRI brain without contrast. - Being under 80 years, she may benefit from vascular evaluation. - Recommend Statin if LDL > 70 - Continue aspirin 817mdaily. - Clopidogrel 7554maily. - Permissive hypertension first 24 h < 220/110. - Telemetry monitoring for arrhythmia. - Recommend bedside Swallow screen. - Recommend Stroke education. - Recommend PT/OT/SLP consult. - rEEG to eval for any epileptogenic discharges. - Correct metabolic derangement.  Electronically signed by: Dr. Lynnae Sandhoff Pager: [phone] 09/30/2020, 11:23 AM

## 2020-09-30 NOTE — Hospital Course (Addendum)
Last Friday she fell, hit the back of her head on the floor. She does not remember the incident, family believes she might have slipped on puppy pad.  She was in her usual state of health until last night when her husband noticed she was continually repeating one word. She kept telling family she did not want to go to hospital and continued to repeating word. Previously had a headache last week which spontaneously resolved.  Usually checks sugars at night. Was told to give 40 units at night. If >200 she takes insulin, if not she does not take it. Usually her sugars are well controlled.  Denies fevers. Mentions she had chest pain earlier in ED. She does not take blood pressure medication at home.   Has hx urinary incontinence, wears pads regularly.  Previous admission to [facility] for blockage in bladder (?). Mentions it was 2-3 years ago.  Frequent UTI's, is on prophylactic antibiotics.  She does take oxycotin for leg cramps.  Family states previous work-up for anemia at OSH.  Last week had leg swelling, redness. States she completed full course of antibiotics for the cellulitis.  She has not had her medications today.  -Lives with her husband. Has two adult children. No current smoking. Previous smoker, unsure how much/when "long time", <79yr, less than 1ppd. No alcohol. Takes imodium sometimes. No drug use.  -Oriented to person, place. Not oriented.  -Code Status: FULL (just one time)

## 2020-09-30 NOTE — ED Provider Notes (Signed)
[place] EMERGENCY DEPARTMENT Provider Note   CSN: [id number] Arrival date & time: 09/30/20  1047  An emergency department physician performed an initial assessment on this suspected stroke patient at 1047.  History No chief complaint on file.   [name] is a 74 y.o. female.  HPI   Patient presented to the ED for evaluation of headache abnormal speech and concern for stroke.  Patient presented to the ED as a code stroke.  She was activated out in the field by EMS.  Patient was noted to have repetitive speech and was moaning    Patient herself is unable to provide a clear history.  She is repetitively asking for help.  Patient states she is not having any trouble with headache.  She denies chest pain or abdominal pain.  Patient denies any complaints right now but she does appear tearful.  Patient is not able to give me a clear history of why her daughter specifically called the ambulance and is not able to tell me what happened this morning.  Patient does have prior history of stroke  Past Medical History:  Diagnosis Date  . Arthritis   . Basal ganglia stroke ([place])   . Bilateral lower extremity edema   . Cancer ([place])   . Carotid artery occlusion   . Flank pain   . Full dentures   . History of cervical cancer    11/ 1994  Stage IB  s/p  high dose radiation brachytherapy @ [facility] 01/ 1995--- per pt no recurrence  . History of chronic bronchitis   . History of hyperthyroidism    d 03/ 2011---due to grave's disease--- s/p  RAI treatment 04/ 2011  . History of kidney stones   . History of sepsis 05/03/2018   due to UTI with klebsiella/ pyelonephritis/ ureteral obstruction cause by stone  . Hyperlipidemia   . Hypothyroidism, postradioiodine therapy    endocrinologist--  dr Loanne Drilling--  dx graves disease and s/p RAI i131 treatement 4/ 2011  . Nauseated   . Oral thrush 05/03/2018  . Pneumonia   . Type 2 diabetes mellitus treated with insulin ([place])     FOLLOWED BY PCP  . Urgency of urination   . Wears glasses     Patient Active Problem List   Diagnosis Date Noted  . Candidiasis of vagina 09/07/2020  . Insomnia 09/07/2020  . Peripheral nerve disease 09/07/2020  . Pneumonia 09/07/2020  . Vitamin D deficiency 09/07/2020  . Wheezing 09/07/2020  . Carotid artery stenosis, symptomatic, right 03/28/2020  . Preoperative clearance 03/21/2020  . History of COVID-19 02/22/2020  . Carotid artery disease ([place]) 02/22/2020  . Seizure ([place]) 02/11/2020  . History of CVA (cerebrovascular accident) 02/11/2020  . Altered mental status 12/12/2019  . Hypoglycemia 12/12/2019  . Acute UTI 11/29/2019  . Normocytic anemia 11/29/2019  . Nausea, vomiting, and diarrhea 07/27/2019  . Hypokalemia 07/27/2019  . Hypomagnesemia 07/27/2019  . History of cervical cancer 03/18/2019  . Pain of left hip joint 02/16/2019  . Sepsis ([place]) 05/04/2018  . Acute lower UTI 05/04/2018  . Uncontrolled type 2 diabetes mellitus with hyperglycemia ([place]) 05/04/2018  . ARF (acute renal failure) ([place]) 05/04/2018  . Essential hypertension 05/04/2018  . Bilateral lower extremity edema 07/31/2017  . MUSCLE CRAMPS 09/19/2010  . NUMBNESS 09/19/2010  . Hypothyroidism 06/06/2010  . THRUSH 11/29/2008  . Type 2 diabetes mellitus ([place]) 10/20/2008  . Hyperlipidemia associated with type 2 diabetes mellitus ([place]) 10/20/2008  . BRONCHITIS, ACUTE WITH  BRONCHOSPASM 10/20/2008  . EMPHYSEMA 10/20/2008  . C O P D 10/20/2008  . CROHN'S DISEASE 10/20/2008  . TOBACCO ABUSE-HISTORY OF 10/20/2008    Past Surgical History:  Procedure Laterality Date  . APPENDECTOMY    . CAROTID ENDARTERECTOMY Right 03/28/2020  . CATARACT EXTRACTION W/ INTRAOCULAR LENS  IMPLANT, BILATERAL  2004  approx.  . CYSTOSCOPY WITH STENT PLACEMENT Right 05/04/2018   Procedure: CYSTOSCOPY WITH STENT PLACEMENT AND RETROGRADE PYELOGRAM;  Surgeon: Ceasar Mons, MD;  Location: [facility];  Service: Urology;   Laterality: Right;  . CYSTOSCOPY/URETEROSCOPY/HOLMIUM LASER/STENT PLACEMENT Right 05/19/2018   Procedure: CYSTOSCOPY, URETEROSCOPY/HOLMIUM LASER, STONE BASKETRY/ STENT EXCHANGE;  Surgeon: Ceasar Mons, MD;  Location: [facility];  Service: Urology;  Laterality: Right;  . ENDARTERECTOMY Right 03/28/2020   Procedure: RIGHT CAROTID ENDARTERECTOMY with PATCH ANGIOPLASTY;  Surgeon: Rosetta Posner, MD;  Location: [facility];  Service: Vascular;  Laterality: Right;  . EXCISION MASS LEFT CHEST WALL  09-20-2007   dr Grandville Silos  [facility]  . Radioactive Iodine Therapy     for thyroid  . REVISION TOTAL HIP ARTHROPLASTY Left early 2000s  . TANDEM RING INSERTION  1995   dr Aldean Ast @ [facility]   EUA w/ tandem placement in ovid  (for direct high dose radiation brachytherapy , cervical cancer)  . TOTAL HIP ARTHROPLASTY Left 1990s  . TRANSTHORACIC ECHOCARDIOGRAM  08/07/2017   mild focal basal hypertrophy of the septum,  ef 00-37%, grade 1 diastolic dysfunction/  atrial septum with lipomatous hypertrophy/  trivial PR     OB History   No obstetric history on file.     Family History  Problem Relation Age of Onset  . Emphysema Father   . Diabetes Father   . Emphysema Sister   . Emphysema Brother   . Cancer Mother        Skin Cancer  . Diabetes Mother     Social History   Tobacco Use  . Smoking status: Former Smoker    Years: 5.00    Types: Cigarettes    Quit date: 05/13/1982    Years since quitting: 38.4  . Smokeless tobacco: Never Used  Vaping Use  . Vaping Use: Never used  Substance Use Topics  . Alcohol use: No  . Drug use: No    Home Medications Prior to Admission medications   Medication Sig Start Date End Date Taking? Authorizing Provider  aspirin EC 81 MG EC tablet Take 1 tablet (81 mg total) by mouth daily. 12/16/19   Hosie Poisson, MD  atorvastatin (LIPITOR) 40 MG tablet Take 1 tablet (40 mg total) by mouth daily at 6 PM. 12/15/19   Hosie Poisson, MD  cephALEXin  (KEFLEX) 250 MG capsule Take 250 mg by mouth daily.     [provider]  clopidogrel (PLAVIX) 75 MG tablet Take 1 tablet (75 mg total) by mouth daily. 12/16/19   Hosie Poisson, MD  ergocalciferol (VITAMIN D2) 50000 UNITS capsule Take 50,000 Units by mouth every Sunday.     [provider]  fluconazole (DIFLUCAN) 150 MG tablet Take 150 mg by mouth daily. 05/02/20   [provider]  gabapentin (NEURONTIN) 300 MG capsule Take 900 mg by mouth 2 (two) times daily.     [provider]  gemfibrozil (LOPID) 600 MG tablet Take 600 mg by mouth 2 (two) times daily.      [provider]  insulin degludec (TRESIBA FLEXTOUCH) 100 UNIT/ML FlexTouch Pen Inject 40 Units into the skin at  bedtime.     [provider]  levothyroxine (SYNTHROID) 100 MCG tablet Take 1 tablet (100 mcg total) by mouth daily before breakfast. 12/16/19   Hosie Poisson, MD  metFORMIN (GLUCOPHAGE) 500 MG tablet Take 500 mg by mouth 2 (two) times daily with a meal.    [provider]  methocarbamol (ROBAXIN) 500 MG tablet Take 500 mg by mouth in the morning and at bedtime.  02/22/20   [provider]  nystatin-triamcinolone (MYCOLOG II) cream SMARTSIG:Topical Morning-Evening 05/02/20   [provider]  oxybutynin (DITROPAN-XL) 10 MG 24 hr tablet Take 10 mg by mouth at bedtime.  07/26/19   [provider]  oxyCODONE ER (XTAMPZA ER) 9 MG C12A Take 9 mg by mouth every 12 (twelve) hours.    [provider]  vitamin B-12 (CYANOCOBALAMIN) 100 MCG tablet Take 100 mcg by mouth daily.    [provider]    Allergies    Codeine  Review of Systems   Review of Systems  Unable to perform ROS: Mental status change  All other systems reviewed and are negative.   Physical Exam Updated Vital Signs BP (!) 170/52 (BP Location: Right Arm)   Pulse 66   Temp 98.2 F (36.8 C) (Oral)   Resp 13   Ht 1.651 m (5' 5" )   Wt 98.9 kg   SpO2 98%   BMI 36.28  kg/m   Physical Exam Vitals and nursing note reviewed.  Constitutional:      Appearance: She is well-developed. She is ill-appearing. She is not diaphoretic.  HENT:     Head: Normocephalic and atraumatic.     Right Ear: External ear normal.     Left Ear: External ear normal.  Eyes:     General: No scleral icterus.       Right eye: No discharge.        Left eye: No discharge.     Conjunctiva/sclera: Conjunctivae normal.  Neck:     Trachea: No tracheal deviation.  Cardiovascular:     Rate and Rhythm: Normal rate and regular rhythm.  Pulmonary:     Effort: Pulmonary effort is normal. No respiratory distress.     Breath sounds: Normal breath sounds. No stridor. No wheezing or rales.  Abdominal:     General: Bowel sounds are normal. There is no distension.     Palpations: Abdomen is soft.     Tenderness: There is no abdominal tenderness. There is no guarding or rebound.  Musculoskeletal:        General: No tenderness.     Cervical back: Neck supple.  Skin:    General: Skin is warm and dry.     Findings: No rash.  Neurological:     Mental Status: She is confused.     Cranial Nerves: No cranial nerve deficit (no facial droop, extraocular movements intact, no slurred speech, aphasia, repetitive statements ).     Sensory: No sensory deficit.     Motor: Weakness present. No abnormal muscle tone or seizure activity.     Comments: Patient appears generally weak at this time although will grip my hands bilaterally, she does move her feet bilaterally but her movements are very slow, she did not lift her legs or arms off the bed without assistance     ED Results / Procedures / Treatments   Labs (all labs ordered are listed, but only abnormal results are displayed) Labs Reviewed  CBC - Abnormal; Notable for the following components:  Result Value   Hemoglobin 11.9 (*)    MCHC 29.9 (*)    All other components within normal limits  COMPREHENSIVE METABOLIC PANEL - Abnormal;  Notable for the following components:   Glucose, Bld 372 (*)    Calcium 8.6 (*)    Total Protein 6.4 (*)    Albumin 3.3 (*)    Alkaline Phosphatase 130 (*)    All other components within normal limits  URINALYSIS, ROUTINE W REFLEX MICROSCOPIC - Abnormal; Notable for the following components:   Color, Urine STRAW (*)    Specific Gravity, Urine 1.044 (*)    Glucose, UA >=500 (*)    Leukocytes,Ua MODERATE (*)    All other components within normal limits  I-STAT CHEM 8, ED - Abnormal; Notable for the following components:   Glucose, Bld 358 (*)    Calcium, Ion 1.03 (*)    All other components within normal limits  CBG MONITORING, ED - Abnormal; Notable for the following components:   Glucose-Capillary 306 (*)    All other components within normal limits  PROTIME-INR  APTT  DIFFERENTIAL    EKG EKG Interpretation  Date/Time:  Sunday September 30 2020 11:30:34 EST Ventricular Rate:  74 PR Interval:    QRS Duration: 99 QT Interval:  419 QTC Calculation: 465 R Axis:   95 Text Interpretation: Sinus rhythm Right axis deviation Borderline T wave abnormalities No significant change since last tracing Confirmed by Dorie Rank [phone]) on 09/30/2020 11:44:57 AM   Radiology CT CEREBRAL PERFUSION W CONTRAST  Result Date: 09/30/2020 CLINICAL DATA:  Acute presentation with aphasia. EXAM: CT ANGIOGRAPHY HEAD AND NECK CT PERFUSION BRAIN TECHNIQUE: Multidetector CT imaging of the head and neck was performed using the standard protocol during bolus administration of intravenous contrast. Multiplanar CT image reconstructions and MIPs were obtained to evaluate the vascular anatomy. Carotid stenosis measurements (when applicable) are obtained utilizing NASCET criteria, using the distal internal carotid diameter as the denominator. Multiphase CT imaging of the brain was performed following IV bolus contrast injection. Subsequent parametric perfusion maps were calculated using RAPID software. CONTRAST:   Not documented COMPARISON:  Head CT earlier same day FINDINGS: CTA NECK FINDINGS Aortic arch: Aortic atherosclerotic calcification. Branching pattern is normal. 30% stenoses at the left common carotid artery and left subclavian artery origins. Right carotid system: Common carotid artery shows areas of soft plaque. Proximal to the bifurcation, the minimal diameter measures 2.3 mm. Previous carotid endarterectomy with wide patency at the endarterectomy site. Cervical ICA widely patent. Left carotid system: Common carotid artery shows areas of soft plaque. Minimal diameter of the ICA in the midportion is 3.3 mm. Carotid bifurcation shows soft plaque but there is no stenosis. Cervical ICA widely patent. Vertebral arteries: Both vertebral artery origins show soft plaque. No measurable stenosis on the right. 50% stenosis on the left. Beyond that, the vertebral arteries show some areas of scattered plaque formation. Additional 50% stenosis of the left vertebral artery at about the C7 level. Skeleton: Mid cervical spondylosis of a mild degree. Other neck: No mass or lymphadenopathy.  Diminutive thyroid tissue. Upper chest: Mild scarring at the lung apices. No active process. Review of the MIP images confirms the above findings CTA HEAD FINDINGS Anterior circulation: Both internal carotid arteries are patent through the skull base and siphon regions. Ordinary calcified atherosclerosis in the carotid siphon regions but without stenosis greater than 30%. The anterior and middle cerebral vessels are patent. No evidence of large or medium vessel occlusion or correctable proximal  stenosis. Posterior circulation: Both vertebral arteries are patent to the basilar. No V4 stenosis. No basilar stenosis. Posterior circulation branch vessels are patent. Venous sinuses: Patent and normal. Anatomic variants: None significant. Review of the MIP images confirms the above findings CT Brain Perfusion Findings: ASPECTS: 10 CBF (<30%) Volume:  63m Perfusion (Tmax>6.0s) volume: 064mMismatch Volume: NonemL Infarction Location:None IMPRESSION: 1. No acute large or medium vessel occlusion. Negative perfusion study. 2. Aortic atherosclerosis. 30% stenoses at the left common carotid artery and left subclavian artery origins. 3. Previous right carotid endarterectomy with wide patency at the endarterectomy site. 4. Atherosclerotic plaque of both common carotid arteries. Minimal diameter on the right proximal to the endarterectomy site is 2.3 mm. Minimal diameter on the left in the midportion is 3.3 mm. 5. Atherosclerotic disease at the left carotid bifurcation but without stenosis. 6. 50% stenosis of the left vertebral artery origin. Additional 50% stenosis of the left vertebral artery at about the C7 level. 7. These results were communicated to Dr. CoTheda Serst 11:31 amon 11/14/2021by text page via the [facility] Incessaging system. Aortic Atherosclerosis (ICD10-I70.0). Electronically Signed   By: MaNelson Chimes.D.   On: 09/30/2020 11:30   CT HEAD CODE STROKE WO CONTRAST  Result Date: 09/30/2020 CLINICAL DATA:  Code stroke.  Aphasia.  Code stroke. EXAM: CT HEAD WITHOUT CONTRAST TECHNIQUE: Contiguous axial images were obtained from the base of the skull through the vertex without intravenous contrast. COMPARISON:  02/11/2020 FINDINGS: Brain: No focal abnormality affects brainstem or cerebellum. Old lacunar infarction left basal ganglia/radiating white matter tracts. No sign of acute infarction, mass lesion, hemorrhage, hydrocephalus or extra-axial collection. Vascular: No hyperdense vessel. Skull: Negative Sinuses/Orbits: Clear/normal Other: None ASPECTS ([place] Program Early CT Score) - Ganglionic level infarction (caudate, lentiform nuclei, internal capsule, insula, M1-M3 cortex): 7 - Supraganglionic infarction (M4-M6 cortex): 3 Total score (0-10 with 10 being normal): 10 IMPRESSION: 1. No acute finding by CT. Old lacunar infarction left basal  ganglia/radiating white matter tracts. 2. ASPECTS is 10. Electronically Signed   By: MaNelson Chimes.D.   On: 09/30/2020 11:04   CT ANGIO HEAD CODE STROKE  Result Date: 09/30/2020 CLINICAL DATA:  Acute presentation with aphasia. EXAM: CT ANGIOGRAPHY HEAD AND NECK CT PERFUSION BRAIN TECHNIQUE: Multidetector CT imaging of the head and neck was performed using the standard protocol during bolus administration of intravenous contrast. Multiplanar CT image reconstructions and MIPs were obtained to evaluate the vascular anatomy. Carotid stenosis measurements (when applicable) are obtained utilizing NASCET criteria, using the distal internal carotid diameter as the denominator. Multiphase CT imaging of the brain was performed following IV bolus contrast injection. Subsequent parametric perfusion maps were calculated using RAPID software. CONTRAST:  Not documented COMPARISON:  Head CT earlier same day FINDINGS: CTA NECK FINDINGS Aortic arch: Aortic atherosclerotic calcification. Branching pattern is normal. 30% stenoses at the left common carotid artery and left subclavian artery origins. Right carotid system: Common carotid artery shows areas of soft plaque. Proximal to the bifurcation, the minimal diameter measures 2.3 mm. Previous carotid endarterectomy with wide patency at the endarterectomy site. Cervical ICA widely patent. Left carotid system: Common carotid artery shows areas of soft plaque. Minimal diameter of the ICA in the midportion is 3.3 mm. Carotid bifurcation shows soft plaque but there is no stenosis. Cervical ICA widely patent. Vertebral arteries: Both vertebral artery origins show soft plaque. No measurable stenosis on the right. 50% stenosis on the left. Beyond that, the vertebral arteries show some areas of scattered plaque  formation. Additional 50% stenosis of the left vertebral artery at about the C7 level. Skeleton: Mid cervical spondylosis of a mild degree. Other neck: No mass or lymphadenopathy.   Diminutive thyroid tissue. Upper chest: Mild scarring at the lung apices. No active process. Review of the MIP images confirms the above findings CTA HEAD FINDINGS Anterior circulation: Both internal carotid arteries are patent through the skull base and siphon regions. Ordinary calcified atherosclerosis in the carotid siphon regions but without stenosis greater than 30%. The anterior and middle cerebral vessels are patent. No evidence of large or medium vessel occlusion or correctable proximal stenosis. Posterior circulation: Both vertebral arteries are patent to the basilar. No V4 stenosis. No basilar stenosis. Posterior circulation branch vessels are patent. Venous sinuses: Patent and normal. Anatomic variants: None significant. Review of the MIP images confirms the above findings CT Brain Perfusion Findings: ASPECTS: 10 CBF (<30%) Volume: 35m Perfusion (Tmax>6.0s) volume: 025mMismatch Volume: NonemL Infarction Location:None IMPRESSION: 1. No acute large or medium vessel occlusion. Negative perfusion study. 2. Aortic atherosclerosis. 30% stenoses at the left common carotid artery and left subclavian artery origins. 3. Previous right carotid endarterectomy with wide patency at the endarterectomy site. 4. Atherosclerotic plaque of both common carotid arteries. Minimal diameter on the right proximal to the endarterectomy site is 2.3 mm. Minimal diameter on the left in the midportion is 3.3 mm. 5. Atherosclerotic disease at the left carotid bifurcation but without stenosis. 6. 50% stenosis of the left vertebral artery origin. Additional 50% stenosis of the left vertebral artery at about the C7 level. 7. These results were communicated to Dr. CoTheda Serst 11:31 amon 11/14/2021by text page via the [facility] system. Aortic Atherosclerosis (ICD10-I70.0). Electronically Signed   By: MaNelson Chimes.D.   On: 09/30/2020 11:30   CT ANGIO NECK CODE STROKE  Result Date: 09/30/2020 CLINICAL DATA:  Acute presentation  with aphasia. EXAM: CT ANGIOGRAPHY HEAD AND NECK CT PERFUSION BRAIN TECHNIQUE: Multidetector CT imaging of the head and neck was performed using the standard protocol during bolus administration of intravenous contrast. Multiplanar CT image reconstructions and MIPs were obtained to evaluate the vascular anatomy. Carotid stenosis measurements (when applicable) are obtained utilizing NASCET criteria, using the distal internal carotid diameter as the denominator. Multiphase CT imaging of the brain was performed following IV bolus contrast injection. Subsequent parametric perfusion maps were calculated using RAPID software. CONTRAST:  Not documented COMPARISON:  Head CT earlier same day FINDINGS: CTA NECK FINDINGS Aortic arch: Aortic atherosclerotic calcification. Branching pattern is normal. 30% stenoses at the left common carotid artery and left subclavian artery origins. Right carotid system: Common carotid artery shows areas of soft plaque. Proximal to the bifurcation, the minimal diameter measures 2.3 mm. Previous carotid endarterectomy with wide patency at the endarterectomy site. Cervical ICA widely patent. Left carotid system: Common carotid artery shows areas of soft plaque. Minimal diameter of the ICA in the midportion is 3.3 mm. Carotid bifurcation shows soft plaque but there is no stenosis. Cervical ICA widely patent. Vertebral arteries: Both vertebral artery origins show soft plaque. No measurable stenosis on the right. 50% stenosis on the left. Beyond that, the vertebral arteries show some areas of scattered plaque formation. Additional 50% stenosis of the left vertebral artery at about the C7 level. Skeleton: Mid cervical spondylosis of a mild degree. Other neck: No mass or lymphadenopathy.  Diminutive thyroid tissue. Upper chest: Mild scarring at the lung apices. No active process. Review of the MIP images confirms the above findings CTA  HEAD FINDINGS Anterior circulation: Both internal carotid arteries  are patent through the skull base and siphon regions. Ordinary calcified atherosclerosis in the carotid siphon regions but without stenosis greater than 30%. The anterior and middle cerebral vessels are patent. No evidence of large or medium vessel occlusion or correctable proximal stenosis. Posterior circulation: Both vertebral arteries are patent to the basilar. No V4 stenosis. No basilar stenosis. Posterior circulation branch vessels are patent. Venous sinuses: Patent and normal. Anatomic variants: None significant. Review of the MIP images confirms the above findings CT Brain Perfusion Findings: ASPECTS: 10 CBF (<30%) Volume: 37m Perfusion (Tmax>6.0s) volume: 040mMismatch Volume: NonemL Infarction Location:None IMPRESSION: 1. No acute large or medium vessel occlusion. Negative perfusion study. 2. Aortic atherosclerosis. 30% stenoses at the left common carotid artery and left subclavian artery origins. 3. Previous right carotid endarterectomy with wide patency at the endarterectomy site. 4. Atherosclerotic plaque of both common carotid arteries. Minimal diameter on the right proximal to the endarterectomy site is 2.3 mm. Minimal diameter on the left in the midportion is 3.3 mm. 5. Atherosclerotic disease at the left carotid bifurcation but without stenosis. 6. 50% stenosis of the left vertebral artery origin. Additional 50% stenosis of the left vertebral artery at about the C7 level. 7. These results were communicated to Dr. CoTheda Serst 11:31 amon 11/14/2021by text page via the [facility]aging system. Aortic Atherosclerosis (ICD10-I70.0). Electronically Signed   By: MaNelson Chimes.D.   On: 09/30/2020 11:30    Procedures Procedures (including critical care time)  Medications Ordered in ED Medications  sodium chloride flush (NS) 0.9 % injection 3 mL (has no administration in time range)  iohexol (OMNIPAQUE) 350 MG/ML injection 100 mL (100 mLs Intravenous Contrast Given 09/30/20 1133)    ED Course  I  have reviewed the triage vital signs and the nursing notes.  Pertinent labs & imaging results that were available during my care of the patient were reviewed by me and considered in my medical decision making (see chart for details).  Clinical Course as of Sep 30 1322  Sun Sep 30, 2020  1246 Labs reviewed.  Hyperglycemia noted   [JK]  1321 UA noted.  Doubt UTI   [JK]    Clinical Course User Index [JK] KnDorie RankMD   MDM Rules/Calculators/A&P                         Patient presented with concerns for possible acute code stroke.  Patient did have CT scans of her head and CT perfusion.  She was seen by the stroke team, Dr. CoTheda Sers No obvious CT scan finding.  Patient is not a TPA candidate at this time.  Plan is for medical admission  Patient's mental status has improved.  Findings and results discussed with patient and daughter.  UA does not suggest UTI at this time.  Plan on admission for further work-up including MRI.  I will consult the medical service for admission  Final Clinical Impression(s) / ED Diagnoses Final diagnoses:  Encephalopathies  Hyperglycemia      KnDorie RankMD 09/30/20 1324

## 2020-09-30 NOTE — ED Triage Notes (Signed)
Pt arrives via EMS from home with complaints of repetitive words and confusion. LSN 0800. Pt also reported to be moaning on the edge of bed as if in pain. Daughter reports pt hx of UTI

## 2020-09-30 NOTE — ED Notes (Signed)
Patient transported to MRI 

## 2020-09-30 NOTE — ED Notes (Signed)
Pt returned from MRI °

## 2020-09-30 NOTE — H&P (Signed)
Date: 09/30/2020               Patient Name:  [name] MRN: [id number]  DOB: [birth year] Age / Sex: 74 y.o., female   PCP: Everardo Beals, NP         Medical Service: Internal Medicine Teaching Service         Attending Physician: Dr. Aldine Contes, MD    First Contact: Dr. Collene Gobble Pager: [phone]  Second Contact: Dr. Marva Panda Pager: [phone]       After Hours (After 5p/  First Contact Pager: [phone]  weekends / holidays): Second Contact Pager: [phone]   Chief Complaint: altered mental status  History of Present Illness:  [name] is a 74yo female (she/her) with previous CVA, type 2 diabetes, COPD, chronic urinary incontinence presenting to [facility] for altered mental status. Per daughter at bedside, patient was in normal state of health until last evening. Patient's husband went to talk to [name] when she "seemed out of it." Reportedly only responded by repeating the same words. She resisted going to the hospital, but family insisted. Of note, patient recently fell and hit the back of her head on the floor. Patient does not remember the incident, but family believes she slipped on a puppy pad.  She was also recently seen for right lower leg celluitis and completed full course of antibiotics. Furthermore, family mentions she has extensive history of urinary tract infections and urinary incontinence. She is currently taking prophylactic antibiotics and wears pads regularly. Otherwise, she is prescribed Lantus 40u each night, but only takes it when sugars >200. She also takes oxycodone, gabapentin, and robaxin for chronic lower leg cramping.  In ED, patient afebrile, hypertensive to 181/62. CBG 306. On exam, patient disoriented without obvious focal neurological deficits. Code stroke called given confusion and slurred speech. IMTS consulted for admission for acute encephalopathy with concern for acute infarct.   Meds:  Current Meds  Medication Sig  . acetaminophen  (TYLENOL) 500 MG tablet Take 500 mg by mouth every 6 (six) hours as needed for headache (pain).  Marland Kitchen aspirin EC 81 MG EC tablet Take 1 tablet (81 mg total) by mouth daily.  Marland Kitchen atorvastatin (LIPITOR) 40 MG tablet Take 1 tablet (40 mg total) by mouth daily at 6 PM. (Patient taking differently: Take 40 mg by mouth at bedtime. )  . baclofen (LIORESAL) 10 MG tablet Take 10 mg by mouth 4 (four) times daily as needed (leg cramps).   . cephALEXin (KEFLEX) 250 MG capsule Take 250 mg by mouth daily.   . clopidogrel (PLAVIX) 75 MG tablet Take 1 tablet (75 mg total) by mouth daily.  . ergocalciferol (VITAMIN D2) 50000 UNITS capsule Take 50,000 Units by mouth every Sunday.   . gabapentin (NEURONTIN) 300 MG capsule Take 900 mg by mouth 2 (two) times daily.   Marland Kitchen gemfibrozil (LOPID) 600 MG tablet Take 600 mg by mouth 2 (two) times daily.    . insulin degludec (TRESIBA FLEXTOUCH) 100 UNIT/ML FlexTouch Pen Inject 40 Units into the skin at bedtime.   Marland Kitchen levothyroxine (SYNTHROID) 100 MCG tablet Take 1 tablet (100 mcg total) by mouth daily before breakfast.  . metFORMIN (GLUCOPHAGE) 500 MG tablet Take 500 mg by mouth 2 (two) times daily with a meal.  . methocarbamol (ROBAXIN) 500 MG tablet Take 500 mg by mouth 2 (two) times daily as needed (leg cramps).   . nystatin cream (MYCOSTATIN) Apply 1 application topically daily as needed (vaginal itching). Mix  with triamcinolone cream  . oxybutynin (DITROPAN-XL) 10 MG 24 hr tablet Take 10 mg by mouth daily.   Marland Kitchen oxyCODONE ER (XTAMPZA ER) 9 MG C12A Take 9 mg by mouth every 12 (twelve) hours.  . triamcinolone cream (KENALOG) 0.1 % Apply 1 application topically daily as needed (vaginal itching). Mix with nystatin cream    Allergies: Allergies as of 09/30/2020 - Review Complete 09/30/2020  Allergen Reaction Noted  . Codeine Nausea Only 10/19/2008   Past Medical History:  Diagnosis Date  . Arthritis   . Basal ganglia stroke ([place])   . Bilateral lower extremity edema   . Cancer  ([place])   . Carotid artery occlusion   . Flank pain   . Full dentures   . History of cervical cancer    11/ 1994  Stage IB  s/p  high dose radiation brachytherapy @ [facility] 01/ 1995--- per pt no recurrence  . History of chronic bronchitis   . History of hyperthyroidism    d 03/ 2011---due to grave's disease--- s/p  RAI treatment 04/ 2011  . History of kidney stones   . History of sepsis 05/03/2018   due to UTI with klebsiella/ pyelonephritis/ ureteral obstruction cause by stone  . Hyperlipidemia   . Hypothyroidism, postradioiodine therapy    endocrinologist--  dr Loanne Drilling--  dx graves disease and s/p RAI i131 treatement 4/ 2011  . Nauseated   . Oral thrush 05/03/2018  . Pneumonia   . Type 2 diabetes mellitus treated with insulin ([place])    FOLLOWED BY PCP  . Urgency of urination   . Wears glasses     Family History:  Family History  Problem Relation Age of Onset  . Emphysema Father   . Diabetes Father   . Emphysema Sister   . Emphysema Brother   . Cancer Mother        Skin Cancer  . Diabetes Mother    Social History:  Patient lives with husband in [place] and has two adult children. Smoked cigarettes previously for less than 10 years, <1ppd. Has not smoked "in a long time." Denies alcohol or other drug use.  Review of Systems: A complete ROS was negative except as per HPI.   Physical Exam: Blood pressure (!) 173/71, pulse 64, temperature 98.2 F (36.8 C), temperature source Oral, resp. rate (!) 21, height 5' 5"  (1.651 m), weight 98.9 kg, SpO2 98 %. Physical Exam Constitutional:      General: She is awake. She is not in acute distress.    Appearance: She is not ill-appearing, toxic-appearing or diaphoretic.  HENT:     Head: Normocephalic and atraumatic.     Mouth/Throat:     Mouth: Mucous membranes are moist. No injury.     Tongue: No lesions. Tongue does not deviate from midline.  Eyes:     General: Lids are normal. No scleral icterus. Cardiovascular:     Rate and  Rhythm: Normal rate and regular rhythm.     Pulses: Normal pulses.     Heart sounds: Normal heart sounds.  Pulmonary:     Effort: Pulmonary effort is normal.     Breath sounds: Normal breath sounds.  Abdominal:     General: Bowel sounds are normal. There is no distension.     Palpations: Abdomen is soft.     Tenderness: There is no abdominal tenderness.  Musculoskeletal:     Right lower leg: No edema.     Left lower leg: No edema.  Skin:  General: Skin is warm and dry.     Coloration: Skin is not cyanotic or jaundiced.  Neurological:     General: No focal deficit present.     Mental Status: She is alert. She is confused.     Comments: Oriented to place, person.  Psychiatric:        Speech: Speech normal.        Behavior: Behavior is cooperative.    CBC Latest Ref Rng & Units 09/30/2020 09/30/2020 09/30/2020  WBC 4.0 - 10.5 K/uL - - 6.5  Hemoglobin 12.0 - 15.0 g/dL 12.9 13.6 11.9(L)  Hematocrit 36 - 46 % 38.0 40.0 39.8  Platelets 150 - 400 K/uL - - 201   CMP Latest Ref Rng & Units 09/30/2020 09/30/2020 09/30/2020  Glucose 70 - 99 mg/dL - 358(H) 372(H)  BUN 8 - 23 mg/dL - 16 14  Creatinine 0.44 - 1.00 mg/dL - 0.80 0.90  Sodium 135 - 145 mmol/L 137 138 136  Potassium 3.5 - 5.1 mmol/L 4.1 4.2 4.1  Chloride 98 - 111 mmol/L - 100 98  CO2 22 - 32 mmol/L - - 23  Calcium 8.9 - 10.3 mg/dL - - 8.6(L)  Total Protein 6.5 - 8.1 g/dL - - 6.4(L)  Total Bilirubin 0.3 - 1.2 mg/dL - - 0.6  Alkaline Phos 38 - 126 U/L - - 130(H)  AST 15 - 41 U/L - - 16  ALT 0 - 44 U/L - - 12   EKG: personally reviewed my interpretation is regular rate, sinus rhythm.  CT Head: No acute finding. Old lacunar infarct left basal ganglia.  CTA Head/Neck: No large or medium vessel occlusion. 50% stenosis of LVA  MRI brain: No acute intracranial abnormality. Mild chronic small vessel ischemic disease.  Assessment & Plan by Problem: Ms. Berland is 74yo female (she/her) with previous CVA, type 2 diabetes,  COPD, chronic urinary incontinence admitted 11/14 with acute encephalopathy, most likely medication-induced.  Active Problems:   Encephalopathy  #Acute encephalopathy On arrival encephalopathy thought to be 2/2 acute infarct. CT, MRI both negative for infarct. On exam, patient without any focal neurological deficits. Patient has also remained afebrile, HDS without leukocytosis. Lungs are clear to auscultation bilaterally, patient is not complaining of any dyspnea. She has remote history of COPD but does not require any home inhalers. Denies history of OSA, does not use CPAP or oxygen at home. Patient not complaining of urinary symptoms, although has chronic urinary incontinence. Patient takes Keflex as prophylaxis, daughter reports compliance with medication. Patient had similar admission at [facility] earlier this year, with TIA most likely etiology at the time. EEG was negative at that time. In addition, CBG 300+ on admission with anion gap and low bicarbonate. VBG shows alkalotic pH, possibly mild DKA with compensation. Patient on multiple centrally-acting medications. Given her age, most likely etiology is medication-induced. Patient's mentation has improved since being in the Emergency Department and holding these medications. Will continue to hold these medications at this time and continue to monitor status. - Hold all centrally-acting medications - Pending beta-hydroxybutyric acid - Pending iron studies, TSH, lipid panel, A1c, coags - BCx, UCx drawn and pending - Trend CBC, CMP - PT/OT, SLP eval - TTE pending  #Type 2 diabetes mellitus On arrival CBG 300-360. No ketonuria, slight anion gap, bicarb normal. Low suspicion for DKA. Patient takes Lantus 40u QHS, although only takes the insulin when her night time sugar is >200. Patient will need diabetes education and close follow-up on discharge. Patient currently  NPO pending SLP evaluation given her current mental status. Will start on low-dose Lantus  and titrate as needed. - F/u beta-hydroxybutyric acid - Start Lantus 20u QHS, will titrate as patient can tolerate po - CBG monitoring   #Hypertension Patient not currently taking antihypertensives at home. Originally allowing for permissive hypertension given concern for stroke, will add losartan 2m and continue to monitor.  - Start losartan [id number]  #Normocytic anemia Hgb 11.9, MCV 87. Previous lab work also reveal normocytic anemia. Most likely chronic disease. No signs of active bleeding on exam. - Trend CBC  DIET: NPO pending SLP eval IVF: LR 100/hr DVT PPX: Lovenox BOWEL: [place] CODE: FULL FAM COM: Discussed with daughter at bedside this afternoon.  Dispo: Admit patient to Inpatient with expected length of stay greater than 2 midnights.  Signed: BrSanjuan DameMD 09/30/2020, 1:55 PM  Pager: [phone] 5pm on weekdays and 1pm on weekends: On Call pager: [phone]

## 2020-10-01 ENCOUNTER — Inpatient Hospital Stay (HOSPITAL_BASED_OUTPATIENT_CLINIC_OR_DEPARTMENT_OTHER): Payer: Medicare HMO

## 2020-10-01 ENCOUNTER — Inpatient Hospital Stay (HOSPITAL_COMMUNITY): Payer: Medicare HMO

## 2020-10-01 DIAGNOSIS — G9340 Encephalopathy, unspecified: Secondary | ICD-10-CM

## 2020-10-01 DIAGNOSIS — R739 Hyperglycemia, unspecified: Secondary | ICD-10-CM | POA: Diagnosis not present

## 2020-10-01 DIAGNOSIS — E1110 Type 2 diabetes mellitus with ketoacidosis without coma: Secondary | ICD-10-CM

## 2020-10-01 DIAGNOSIS — D509 Iron deficiency anemia, unspecified: Secondary | ICD-10-CM | POA: Diagnosis not present

## 2020-10-01 DIAGNOSIS — I6389 Other cerebral infarction: Secondary | ICD-10-CM

## 2020-10-01 DIAGNOSIS — Z8673 Personal history of transient ischemic attack (TIA), and cerebral infarction without residual deficits: Secondary | ICD-10-CM

## 2020-10-01 DIAGNOSIS — I10 Essential (primary) hypertension: Secondary | ICD-10-CM | POA: Diagnosis not present

## 2020-10-01 DIAGNOSIS — E1165 Type 2 diabetes mellitus with hyperglycemia: Secondary | ICD-10-CM

## 2020-10-01 DIAGNOSIS — E119 Type 2 diabetes mellitus without complications: Secondary | ICD-10-CM | POA: Diagnosis not present

## 2020-10-01 DIAGNOSIS — Z794 Long term (current) use of insulin: Secondary | ICD-10-CM

## 2020-10-01 LAB — HEMOGLOBIN A1C
Hgb A1c MFr Bld: 9.6 % — ABNORMAL HIGH (ref 4.8–5.6)
Mean Plasma Glucose: 228.82 mg/dL

## 2020-10-01 LAB — IRON AND TIBC
Iron: 33 ug/dL (ref 28–170)
Saturation Ratios: 9 % — ABNORMAL LOW (ref 10.4–31.8)
TIBC: 358 ug/dL (ref 250–450)
UIBC: 325 ug/dL

## 2020-10-01 LAB — GLUCOSE, CAPILLARY
Glucose-Capillary: 206 mg/dL — ABNORMAL HIGH (ref 70–99)
Glucose-Capillary: 242 mg/dL — ABNORMAL HIGH (ref 70–99)

## 2020-10-01 LAB — CBC
HCT: 34.6 % — ABNORMAL LOW (ref 36.0–46.0)
Hemoglobin: 10.9 g/dL — ABNORMAL LOW (ref 12.0–15.0)
MCH: 26.4 pg (ref 26.0–34.0)
MCHC: 31.5 g/dL (ref 30.0–36.0)
MCV: 83.8 fL (ref 80.0–100.0)
Platelets: 228 10*3/uL (ref 150–400)
RBC: 4.13 MIL/uL (ref 3.87–5.11)
RDW: 14.5 % (ref 11.5–15.5)
WBC: 7.3 10*3/uL (ref 4.0–10.5)
nRBC: 0 % (ref 0.0–0.2)

## 2020-10-01 LAB — COMPREHENSIVE METABOLIC PANEL
ALT: 11 U/L (ref 0–44)
AST: 14 U/L — ABNORMAL LOW (ref 15–41)
Albumin: 2.9 g/dL — ABNORMAL LOW (ref 3.5–5.0)
Alkaline Phosphatase: 99 U/L (ref 38–126)
Anion gap: 10 (ref 5–15)
BUN: 11 mg/dL (ref 8–23)
CO2: 26 mmol/L (ref 22–32)
Calcium: 8.6 mg/dL — ABNORMAL LOW (ref 8.9–10.3)
Chloride: 103 mmol/L (ref 98–111)
Creatinine, Ser: 0.8 mg/dL (ref 0.44–1.00)
GFR, Estimated: >60 mL/min (ref >60)
Glucose, Bld: 251 mg/dL — ABNORMAL HIGH (ref 70–99)
Potassium: 3.3 mmol/L — ABNORMAL LOW (ref 3.5–5.1)
Sodium: 139 mmol/L (ref 135–145)
Total Bilirubin: 0.6 mg/dL (ref 0.3–1.2)
Total Protein: 5.9 g/dL — ABNORMAL LOW (ref 6.5–8.1)

## 2020-10-01 LAB — BETA-HYDROXYBUTYRIC ACID: Beta-Hydroxybutyric Acid: 0.5 mmol/L — ABNORMAL HIGH (ref 0.05–0.27)

## 2020-10-01 LAB — PROTIME-INR
INR: 1.2 (ref 0.8–1.2)
Prothrombin Time: 14.5 seconds (ref 11.4–15.2)

## 2020-10-01 LAB — LIPID PANEL
Cholesterol: 128 mg/dL (ref 0–200)
HDL: 22 mg/dL — ABNORMAL LOW (ref >40)
LDL Cholesterol: 49 mg/dL (ref 0–99)
Total CHOL/HDL Ratio: 5.8 RATIO
Triglycerides: 283 mg/dL — ABNORMAL HIGH (ref <150)
VLDL: 57 mg/dL — ABNORMAL HIGH (ref 0–40)

## 2020-10-01 LAB — TSH: TSH: 0.521 u[IU]/mL (ref 0.350–4.500)

## 2020-10-01 LAB — ECHOCARDIOGRAM COMPLETE
Area-P 1/2: 3.42 cm2
Height: 65 in
S' Lateral: 2.5 cm
Weight: 3488.56 [oz_av]

## 2020-10-01 LAB — APTT: aPTT: 33 seconds (ref 24–36)

## 2020-10-01 LAB — FERRITIN: Ferritin: 16 ng/mL (ref 11–307)

## 2020-10-01 MED ORDER — INSULIN ASPART 100 UNIT/ML ~~LOC~~ SOLN
0.0000 [IU] | Freq: Three times a day (TID) | SUBCUTANEOUS | Status: DC
  Administered 2020-10-01 (×2): 5 [IU] via SUBCUTANEOUS

## 2020-10-01 MED ORDER — SODIUM CHLORIDE 0.9 % IV SOLN
510.0000 mg | Freq: Once | INTRAVENOUS | Status: AC
  Administered 2020-10-01: 510 mg via INTRAVENOUS
  Filled 2020-10-01: qty 17

## 2020-10-01 MED ORDER — TRESIBA FLEXTOUCH 100 UNIT/ML ~~LOC~~ SOPN
30.0000 [IU] | PEN_INJECTOR | Freq: Every day | SUBCUTANEOUS | 0 refills | Status: DC
Start: 2020-10-01 — End: 2021-04-01

## 2020-10-01 MED ORDER — POTASSIUM CHLORIDE CRYS ER 20 MEQ PO TBCR
40.0000 meq | EXTENDED_RELEASE_TABLET | Freq: Once | ORAL | Status: AC
  Administered 2020-10-01: 40 meq via ORAL
  Filled 2020-10-01: qty 2

## 2020-10-01 NOTE — Care Management CC44 (Signed)
Condition Code 44 Documentation Completed  Patient Details  Name: [name] MRN: [id number] Date of Birth: [birth year]   Condition Code 44 given:  Yes Patient signature on Condition Code 44 notice:  Yes Documentation of 2 MD's agreement:  Yes Code 44 added to claim:  Yes    Pollie Friar, RN 10/01/2020, 12:28 PM

## 2020-10-01 NOTE — Progress Notes (Signed)
Inpatient Diabetes Program Recommendations  AACE/ADA: New Consensus Statement on Inpatient Glycemic Control (2015)  Target Ranges:  Prepandial:   less than 140 mg/dL      Peak postprandial:   less than 180 mg/dL (1-2 hours)      Critically ill patients:  140 - 180 mg/dL   Lab Results  Component Value Date   GLUCAP 206 (H) 10/01/2020   HGBA1C 9.6 (H) 10/01/2020    Review of Glycemic Control Results for [name], [name] (MRN [id number]) as of 10/01/2020 09:29  Ref. Range 09/30/2020 10:50 09/30/2020 22:40 10/01/2020 06:21  Glucose-Capillary Latest Ref Range: 70 - 99 mg/dL 306 (H) 266 (H) 206 (H)   Diabetes history:  T2DM Outpatient Diabetes medications:  Tresiba 40 units qhs Novolog SSI if >200 mg/dL Current orders for Inpatient glycemic control:  Lantus 20 units qhs Novolog 0-15 units tid  Note: Briefly spoke with patient and daughter this morning to verify above home medications.  They both confirm she takes [place] 40 qhs and Novolog if > 200.  She checks her blood sugar daily.    Will continue to follow while inpatient.  Thank you, Reche Dixon, RN, BSN Diabetes Coordinator Inpatient Diabetes Program [phone] (team pager from 8a-5p)

## 2020-10-01 NOTE — Progress Notes (Signed)
STROKE TEAM PROGRESS NOTE   INTERVAL HISTORY Her daughter is at the bedside.  Pt is sitting in chair, stated that she is ready to go home. Pt hyperglycemia in better control, UA neg. MRI no infarct and EEG showed mild diffuse encephalopathy. Daughter stated that pt mental status much better, she thought that pt may take too much her pain medications. Per daughter, pt is taking in charge of home meds and I encouraged her daughter to help her with meds.    OBJECTIVE Vitals:   10/01/20 0100 10/01/20 0212 10/01/20 0400 10/01/20 0600  BP: (!) 124/100 (!) 166/59 (!) 168/69 (!) 150/78  Pulse: 71 63  70  Resp: 19 16  18   Temp:  98.1 F (36.7 C) 98.2 F (36.8 C) 98.2 F (36.8 C)  TempSrc:  Oral Oral Oral  SpO2: 97% 100%  100%  Weight:      Height:        CBC:  Recent Labs  Lab 09/30/20 1103 09/30/20 1109 09/30/20 1823 10/01/20 0400  WBC 6.5  --   --  7.3  NEUTROABS 4.8  --   --   --   HGB 11.9*   < > 12.9 10.9*  HCT 39.8   < > 38.0 34.6*  MCV 86.9  --   --  83.8  PLT 201  --   --  228   < > = values in this interval not displayed.    Basic Metabolic Panel:  Recent Labs  Lab 09/30/20 1103 09/30/20 1103 09/30/20 1109 09/30/20 1109 09/30/20 1823 10/01/20 0400  NA 136   < > 138   < > 137 139  K 4.1   < > 4.2   < > 4.1 3.3*  CL 98   < > 100  --   --  103  CO2 23  --   --   --   --  26  GLUCOSE 372*   < > 358*  --   --  251*  BUN 14   < > 16  --   --  11  CREATININE 0.90   < > 0.80  --   --  0.80  CALCIUM 8.6*  --   --   --   --  8.6*   < > = values in this interval not displayed.    Lipid Panel:     Component Value Date/Time   CHOL 128 10/01/2020 0400   TRIG 283 (H) 10/01/2020 0400   HDL 22 (L) 10/01/2020 0400   CHOLHDL 5.8 10/01/2020 0400   VLDL 57 (H) 10/01/2020 0400   LDLCALC 49 10/01/2020 0400   HgbA1c:  Lab Results  Component Value Date   HGBA1C 9.6 (H) 10/01/2020   Urine Drug Screen: No results found for: LABOPIA, COCAINSCRNUR, LABBENZ, AMPHETMU, THCU,  LABBARB  Alcohol Level No results found for: [place]  MR BRAIN WO CONTRAST 09/30/2020 IMPRESSION:  1. No acute intracranial abnormality identified on this motion degraded study.  2. Mild chronic small vessel ischemic disease.  CT ANGIO HEAD CODE STROKE CT CEREBRAL PERFUSION W CONTRAST CT ANGIO NECK CODE STROKE 09/30/2020 IMPRESSION:  1. No acute large or medium vessel occlusion. Negative perfusion study.  2. Aortic atherosclerosis. 30% stenoses at the left common carotid artery and left subclavian artery origins.  3. Previous right carotid endarterectomy with wide patency at the endarterectomy site.  4. Atherosclerotic plaque of both common carotid arteries. Minimal diameter on the right proximal to the endarterectomy site  is 2.3 mm. Minimal diameter on the left in the midportion is 3.3 mm.  5. Atherosclerotic disease at the left carotid bifurcation but without stenosis.  6. 50% stenosis of the left vertebral artery origin. Additional 50% stenosis of the left vertebral artery at about the C7 level.   CT HEAD CODE STROKE WO CONTRAST 09/30/2020 IMPRESSION:  1. No acute finding by CT. Old lacunar infarction left basal ganglia/radiating white matter tracts.  2. ASPECTS is 10.   Transthoracic Echocardiogram  00/00/2021 Pending  ECG - SR rate 74 BPM. (See cardiology reading for complete details)  EEG - pending  PHYSICAL EXAM  Temp:  [98.1 F (36.7 C)-98.2 F (36.8 C)] 98.2 F (36.8 C) (11/15 1248) Pulse Rate:  [63-74] 66 (11/15 1248) Resp:  [12-24] 18 (11/15 1248) BP: (124-198)/(47-100) 133/50 (11/15 1248) SpO2:  [97 %-100 %] 100 % (11/15 1248)  General - Well nourished, well developed, in no apparent distress.  Ophthalmologic - fundi not visualized due to noncooperation.  Cardiovascular - Regular rhythm and rate.  Mental Status -  Level of arousal and orientation to year, place, and person were intact. But not to month Language including expression, naming,  repetition, comprehension was assessed and found intact. Attention span and concentration were impaired, wrong with backward spelling. Recent and remote memory were 3/3 registration and 1/3 delayed recall, 2/3 with cues. Fund of Knowledge was assessed and was impaired, not able to tell name of presidents.  Cranial Nerves II - XII - II - Visual field intact OU. III, IV, VI - Extraocular movements intact. V - Facial sensation intact bilaterally. VII - Facial movement intact bilaterally. VIII - Hearing & vestibular intact bilaterally. X - Palate elevates symmetrically. XI - Chin turning & shoulder shrug intact bilaterally. XII - Tongue protrusion intact.  Motor Strength - The patient's strength was normal in all extremities and pronator drift was absent.  Bulk was normal and fasciculations were absent.   Motor Tone - Muscle tone was assessed at the neck and appendages and was normal  Reflexes - The patient's reflexes were symmetrical in all extremities and she had no pathological reflexes.  Sensory - Light touch, temperature/pinprick were assessed and were symmetrical.    Coordination - The patient had normal movements in the hands with no ataxia or dysmetria.  Tremor was absent.  Gait and Station - deferred.   ASSESSMENT/PLAN [name] is a 74 y.o. female with history of previous stroke March 2021, DM, Hx of cervical cancer, HLD, hyper -> hypothyroidism, known carotid disease with Rt CEA in May 2021 presented to ED after she was noted to be confused, had difficulty speaking, had a severe headache and blood glucose of 384.  She did not receive IV t-PA.   Encephalopathy - likely combination of medication, high BP and hyperglycemia   CT Head - No acute finding by CT. Old lacunar infarction left basal ganglia/radiating white matter tracts. ASPECTS is 10.      MRI head - No acute intracranial abnormality identified on this motion degraded study. Mild chronic small vessel  ischemic disease.  CTA H&N - L CCA mild and R CCA moderate stenosis. Previous right carotid endarterectomy with wide patency at the endarterectomy site. 50% stenosis of the left vertebral artery origin. Additional 50% stenosis of the left vertebral artery at about the C7 level.   CTP neg  EEG - mild diffuse encephalopathy  2D Echo - EF 60-65%  [employer] Virus 2 - negative  (positive 9  months ago)  LDL - 49  HgbA1c - 9.6  VTE prophylaxis - Lovenox  plavix prior to admission, now on clopidogrel 75 mg daily. Continue plavix on discharge.  Patient counseled to be compliant with her antithrombotic medications  Ongoing aggressive stroke risk factor management  Therapy recommendations:  pending  Disposition:  Pending  Hx of stroke  11/2019 admitted for right sided weakness, MRI showed left BG infarct, MRA neg. CUS bilaterally 40-59% stenosis. CTA head and neck right ICA 80% stenosis, left VA 55% stenosis. DVT neg. LDL 77 and A1C 5.5. EEG neg. VVS consulted and follow up as outpt. Discharged with DAPT and lipitor 40  01/2020 right sided weakness and numbness. EEG neg, CTA head and neck unchanged  03/2020 had right CEA with Dr. Donnetta Hutching  Hyperglycemia  Uncontrolled DM  Home diabetic meds: insulin ; metformin  Current diabetic meds: lantus  HgbA1c 9.6, goal < 7.0  SSI  CBG monitoring  Encourage pt to compliant with meds  Close PCP follow up  Hypertension  Home BP meds: none  Current BP meds: Losartan   Stable . Long-term BP goal normotensive  Hyperlipidemia  Home Lipid lowering medication: Lipitor 40 mg daily   LDL 49, goal < 70   Current lipid lowering medication: Lipitor 40 mg daily   Continue statin at discharge  Other Stroke Risk Factors  Advanced age  Former cigarette smoker - quit  Obesity, Body mass index is 36.28 kg/m., recommend weight loss, diet and exercise as appropriate   Hx stroke/TIA  Other Active Problems  Code status - Full code    Hypokalemia - potassium - 3.3 supplemented  Hospital day # 1  Neurology will sign off. Please call with questions. Pt will follow up with stroke clinic NP Jessica at [facility] on 10/22/20. Thanks for the consult.  Rosalin Hawking, MD PhD Stroke Neurology 10/01/2020 2:10 PM  To contact Stroke Continuity provider, please refer to [email or web address] After hours, contact General Neurology

## 2020-10-01 NOTE — Progress Notes (Signed)
   10/01/20 0212  Vitals  Temp 98.1 F (36.7 C)  Temp Source Oral  BP (!) 166/59  MAP (mmHg) 90  BP Location Left Arm  BP Method Automatic  Patient Position (if appropriate) Lying  Pulse Rate 63  Pulse Rate Source Monitor  Resp 16

## 2020-10-01 NOTE — Procedures (Signed)
Patient Name: JOHNELL LANDOWSKI  MRN: [id number]  Epilepsy Attending: Lora Havens  Referring Physician/Provider: Dr Harvie Heck Date: 10/01/2020 Duration: 22.39 mins  Patient history: 74 year old female with past medical history of stroke now with encephalopathy.  EEG to evaluate for seizures.  Level of alertness: Awake  AEDs during EEG study: None  Technical aspects: This EEG study was done with scalp electrodes positioned according to the 10-20 International system of electrode placement. Electrical activity was acquired at a sampling rate of 500Hz  and reviewed with a high frequency filter of 70Hz  and a low frequency filter of 1Hz . EEG data were recorded continuously and digitally stored.   Description: The posterior dominant rhythm consists of 9 Hz activity of moderate voltage (25-35 uV) seen predominantly in posterior head regions, symmetric and reactive to eye opening and eye closing. EEG showed intermittent generalized 3 to 6 Hz theta-delta slowing.  Hyperventilation and photic stimulation were not performed.     ABNORMALITY -Intermittent slow, generalized  IMPRESSION: This study is suggestive of mild diffuse encephalopathy, nonspecific etiology. No seizures or epileptiform discharges were seen throughout the recording.  Sanjuan Sawa Barbra Sarks

## 2020-10-01 NOTE — Progress Notes (Signed)
   Subjective:  Patient evaluated at bedside this AM with daughter. Patient reporting she feels good today, much better than yesterday. Discussed work-up negative for stroke, seizure, and infectious etiologies. Most likely encephalopathy 2/2 numerous centrally -acting medications. Also discuss diabetes regimen upon discharge.  Objective:  Vital signs in last 24 hours: Vitals:   09/30/20 2300 10/01/20 0000 10/01/20 0100 10/01/20 0212  BP: (!) 165/51 136/61 (!) 124/100 (!) 166/59  Pulse: 65 63 71 63  Resp: 20 12 19 16   Temp:    98.1 F (36.7 C)  TempSrc:    Oral  SpO2: 100% 99% 97% 100%  Weight:      Height:       Physical Exam: General: Chronically, ill-appearing. No acute distress Extremities: No pitting edema. Neuro: Awake, alert, oriented x4  Assessment/Plan: [name] is 74yo female (she/her) with previous CVA, type 2 diabetes mellitus, COPD, chronic urinary incontinence admitted 11/14 acute toxic encephalopathy most likely 2/2 polypharmacy.  Active Problems:   Encephalopathy  #Acute toxic encephalopathy 2/2 polypharmacy Stroke work-up negative on CT and MRI. Given patient's acute encephalopathy and hx recent fall, EEG obtained this morning. No seizure like activity present. Patient has continued to be afebrile without leukocytosis throughout stay. No pulmonary or urinary symptoms. BCx, UCx unrevealing. Patient steadily improved since admission after holding multiple centrally-acting medications. Most likely these medications contributed to her symptoms. Discussed holding all except oxycodone to prevent withdrawals. Encourage 1 tablet per day with 2 only if needed. Also encouraged tylenol use, as liver function appears normal. Patient to follow-up with primary care doctor and pain management doctor. - Hold gabapentin, robaxin, baclofen, oxybutynin - Can re-start oxycodone 60mER upon discharge, 1 tablet per day - Encourage discussion with primary care physician regarding risk vs  benefit of these medications given multiple hospitalizations for acute encephalopathy  #Type 2 diabetes mellitus A1c 9.6 during admission. Patient only takes Lantus 40 QHS at home if night sugars >200. Discussed importance and consistent use of insulin. Encouraged decrease in insulin dosage to help prevent hypoglycemic episodes. Patient to follow-up with primary care, as she likely could benefit from daytime coverage as well. - Discharge with Lantus 30u QHS  #Hypertension BP elevated in ED, started on losartan 244mqd. Patient normotensive this morning. Not on blood pressure medication at home. Encourage patient to discuss need for anti-hypertensive. - Will defer d/c with losartan, encourage discussion with PCP   #Iron deficiency anemia Fe 33, sat 9% during admission. Given one dose IV Feraheme while hospitalized. Encouraged use of iron supplements once discharged. - IV Feraheme  DIET: HH IVF: n/a DVT PPX: Lovenox BOWEL: [place] CODE: FULL FAM COM: Discussed with daughter at bedside this morning.  Prior to Admission Living Arrangement: Home Anticipated Discharge Location: Home Barriers to Discharge: Home Dispo: Anticipated discharge in approximately 0 day(s).   BrSanjuan DameMD 10/01/2020, 6:27 AM Pager: [phone] 5pm on weekdays and 1pm on weekends: On Call pager [phone]

## 2020-10-01 NOTE — Progress Notes (Signed)
EEG Completed; Results Pending  

## 2020-10-01 NOTE — Progress Notes (Signed)
Pt discharge education and instructions completed with pt and daughter at bedside, both voices understanding and denies any questions. Pt IV and telemetry removed. Pt discharged home with daughter to transport her home. Pt to pick up electronically sent prescription from preferred pharmacy on file. Pt transported off unit via wheelchair with belongings to the side. Delia Heady RN

## 2020-10-01 NOTE — TOC Transition Note (Signed)
Transition of Care [facility]) - CM/SW Discharge Note   Patient Details  Name: [name] MRN: [id number] Date of Birth: [birth year]  [facility] CM/SW Contact:  Pollie Friar, RN Phone Number: 10/01/2020, 2:03 PM   Clinical Narrative:    Pt discharging home with [facility] service through [facility]. Pt was recently active with [facility] and asked to use them again. [facility] with [facility] accepted the referral. Pt has supervision at home and transport to home.   Final next level of care: Home w Home Health Services Barriers to Discharge: No Barriers Identified   Patient Goals and CMS Choice   CMS Medicare.gov Compare Post Acute Care list provided to:: Patient Choice offered to / list presented to : Patient  Discharge Placement                       Discharge Plan and Services                          HH Arranged: PT, OT [facility] Agency: [facility] Date [place]: 10/01/20   Representative spoke with at [place]: [place] ([place]) Interventions     Readmission Risk Interventions Readmission Risk Prevention Plan 03/29/2020  Transportation Screening Complete  HRI or [place] Complete  Social Work Consult for [place] Planning/Counseling Complete  Palliative Care Screening Complete  Medication Review Press photographer) Complete  Some recent data might be hidden

## 2020-10-01 NOTE — Progress Notes (Signed)
Echocardiogram 2D Echocardiogram has been performed.  [place] Deputy Lyla Jasek 10/01/2020, 12:10 PM

## 2020-10-01 NOTE — Care Management Obs Status (Signed)
[place] NOTIFICATION   Patient Details  Name: [name] MRN: [id number] Date of Birth: Jun 14, 1946   Medicare Observation Status Notification Given:  Yes    Pollie Friar, RN 10/01/2020, 12:28 PM

## 2020-10-01 NOTE — Progress Notes (Signed)
   10/01/20 0212  Assess: MEWS Score  Temp 98.1 F (36.7 C)  BP (!) 166/59  Pulse Rate 63  Resp 16  SpO2 100 %  O2 Device Room Air  Assess: MEWS Score  MEWS Temp 0  MEWS Systolic 0  MEWS Pulse 0  MEWS RR 0  MEWS LOC 0  MEWS Score 0  MEWS Score Color Nyoka Cowden

## 2020-10-01 NOTE — Evaluation (Signed)
Occupational Therapy Evaluation Patient Details Name: [name] MRN: [id number] DOB: 05-17-1946 Today's Date: 10/01/2020    History of Present Illness Patient is a 74 y/o female who presents with AMS and speech difficulties. NIH:8. MRI and CT-negative. Noted to have extensive bilateral carotid artery stenoses with left ICA showing plague. PMH includes CVA, DM, HLD, peripheral neuropathy, overactive bladder.   Clinical Impression   PT admitted with AMS. Pt currently with functional limitiations due to the deficits listed below (see OT problem list). Pt currently with balance deficits with adls but able to complete supervision with sitting. Pt benefits from RW for safety.  Pt will benefit from skilled OT to increase their independence and safety with adls and balance to allow discharge [place].     Follow Up Recommendations  Home health OT    Equipment Recommendations  None recommended by OT    Recommendations for Other Services       Precautions / Restrictions Precautions Precautions: Fall Precaution Comments: hx of falls      Mobility Bed Mobility Overal bed mobility: Needs Assistance Bed Mobility: Supine to Sit Rolling: Supervision   Supine to sit: Supervision     General bed mobility comments: heavy use of bed rail with bil UE to pull herself into sitting    Transfers Overall transfer level: Needs assistance Equipment used: Rolling walker (2 wheeled) Transfers: Sit to/from Stand Sit to Stand: Min guard         General transfer comment: requires use of RW to decr fall risk. pt reaching for environmental support    Balance Overall balance assessment: History of Falls;Needs assistance Sitting-balance support: Bilateral upper extremity supported;Feet supported Sitting balance-Leahy Scale: Good     Standing balance support: Bilateral upper extremity supported;During functional activity Standing balance-Leahy Scale: Poor Standing balance comment: reliance  on RW                           ADL either performed or assessed with clinical judgement   ADL Overall ADL's : Needs assistance/impaired                 Upper Body Dressing : Supervision/safety   Lower Body Dressing: Supervision/safety   Toilet Transfer: Min guard;Ambulation Toilet Transfer Details (indicate cue type and reason): RW         Functional mobility during ADLs: Min guard;Rolling walker General ADL Comments: pt encouraged to use walker for pain management and decr fall risk     Vision Baseline Vision/History: Wears glasses       Perception     Praxis      Pertinent Vitals/Pain Pain Assessment: Faces Faces Pain Scale: Hurts little more Pain Location: R foot ( see that red) Pain Descriptors / Indicators: Discomfort;Guarding Pain Intervention(s): Monitored during session;Repositioned     Hand Dominance Right   Extremity/Trunk Assessment Upper Extremity Assessment Upper Extremity Assessment: Overall WFL for tasks assessed   Lower Extremity Assessment Lower Extremity Assessment: RLE deficits/detail RLE Deficits / Details: decreased hip flexion abduction compared to L side. Pt with edema in the foot   Cervical / Trunk Assessment Cervical / Trunk Assessment: Normal   Communication Communication Communication: No difficulties   Cognition Arousal/Alertness: Awake/alert Behavior During Therapy: WFL for tasks assessed/performed Overall Cognitive Status: Impaired/Different from baseline Area of Impairment: Safety/judgement;Awareness                         Safety/Judgement: Decreased awareness  of deficits;Decreased awareness of safety Awareness: Emergent   General Comments: pt able to recall all information and very fixated on R LE redness and following up with her doctor   General Comments  daughter present at the start of session and left to get patient lunch from ground floor. OT providing apple juice to patient per her  request    Exercises     Shoulder Instructions      Home Living Family/patient expects to be discharged to:: Private residence Living Arrangements: Spouse/significant other Available Help at Discharge: Family;Available 24 hours/day Type of Home: House Home Access: Ramped entrance     Home Layout: One level     Bathroom Shower/Tub: Occupational psychologist: Handicapped height     Home Equipment: Shower seat   Additional Comments: daughter can (A)      Prior Functioning/Environment Level of Independence: Independent with assistive device(s)        Comments: babysitting great granddaughter with spouse and daughter watching on video monitor per daughter present.         OT Problem List: Decreased strength;Impaired balance (sitting and/or standing);Decreased activity tolerance;Decreased cognition;Decreased safety awareness;Decreased knowledge of use of DME or AE;Obesity;Pain      OT Treatment/Interventions: Self-care/ADL training;Therapeutic exercise;Energy conservation;DME and/or AE instruction;Manual therapy;Therapeutic activities;Cognitive remediation/compensation;Patient/family education;Balance training    OT Goals(Current goals can be found in the care plan section) Acute Rehab OT Goals Patient Stated Goal: to go home OT Goal Formulation: With patient Time For Goal Achievement: 10/15/20 Potential to Achieve Goals: Good  OT Frequency: Min 2X/week   Barriers to D/C:            Co-evaluation              AM-PAC OT "6 Clicks" Daily Activity     Outcome Measure Help from another person eating meals?: None Help from another person taking care of personal grooming?: None Help from another person toileting, which includes using toliet, bedpan, or urinal?: A Little Help from another person bathing (including washing, rinsing, drying)?: A Little Help from another person to put on and taking off regular upper body clothing?: None Help from another  person to put on and taking off regular lower body clothing?: A Little 6 Click Score: 21   End of Session Equipment Utilized During Treatment: Rolling walker Nurse Communication: Mobility status;Precautions  Activity Tolerance: Patient tolerated treatment well Patient left: in chair;with call bell/phone within reach;with chair alarm set  OT Visit Diagnosis: Unsteadiness on feet (R26.81);Muscle weakness (generalized) (M62.81)                Time: 1321-1340 OT Time Calculation (min): 19 min Charges:  OT General Charges $OT Visit: 1 Visit OT Evaluation $OT Eval Moderate Complexity: 1 Mod   Brynn, OTR/L  Acute Rehabilitation Services Pager: [phone] Office: [phone] .   Jeri Modena 10/01/2020, 2:06 PM

## 2020-10-01 NOTE — Discharge Summary (Signed)
Name: [name] MRN: [id number] DOB: 1946-04-26 74 y.o. PCP: Everardo Beals, NP  Date of Admission: 09/30/2020 10:48 AM Date of Discharge:  10/01/2020 Attending Physician: Axel Filler, *  Discharge Diagnosis: 1. Acute toxic encephalopathy 2/2 polypharmacy 2. Type 2 diabetes mellitus 3. Hypertension 4. Iron deficiency anemia  Discharge Medications: Allergies as of 10/01/2020      Reactions   Codeine Nausea Only      Medication List    STOP taking these medications   baclofen 10 MG tablet Commonly known as: LIORESAL   gabapentin 300 MG capsule Commonly known as: NEURONTIN   methocarbamol 500 MG tablet Commonly known as: ROBAXIN   oxybutynin 10 MG 24 hr tablet Commonly known as: DITROPAN-XL     TAKE these medications   acetaminophen 500 MG tablet Commonly known as: TYLENOL Take 500 mg by mouth every 6 (six) hours as needed for headache (pain).   aspirin 81 MG EC tablet Take 1 tablet (81 mg total) by mouth daily.   atorvastatin 40 MG tablet Commonly known as: LIPITOR Take 1 tablet (40 mg total) by mouth daily at 6 PM. What changed: when to take this   cephALEXin 250 MG capsule Commonly known as: KEFLEX Take 250 mg by mouth daily.   clopidogrel 75 MG tablet Commonly known as: PLAVIX Take 1 tablet (75 mg total) by mouth daily.   ergocalciferol 1.25 MG (50000 UT) capsule Commonly known as: VITAMIN D2 Take 50,000 Units by mouth every Sunday.   gemfibrozil 600 MG tablet Commonly known as: LOPID Take 600 mg by mouth 2 (two) times daily.   levothyroxine 100 MCG tablet Commonly known as: SYNTHROID Take 1 tablet (100 mcg total) by mouth daily before breakfast.   metFORMIN 500 MG tablet Commonly known as: GLUCOPHAGE Take 500 mg by mouth 2 (two) times daily with a meal.   nystatin cream Commonly known as: MYCOSTATIN Apply 1 application topically daily as needed (vaginal itching). Mix with triamcinolone cream   Tyler Aas FlexTouch  100 UNIT/ML FlexTouch Pen Generic drug: insulin degludec Inject 30 Units into the skin at bedtime. What changed: how much to take   triamcinolone 0.1 % Commonly known as: KENALOG Apply 1 application topically daily as needed (vaginal itching). Mix with nystatin cream   Xtampza ER 9 MG C12a Generic drug: oxyCODONE ER Take 9 mg by mouth every 12 (twelve) hours.       Disposition and follow-up:   Ms.Cortnie F Marston was discharged from [facility] in Stable condition.  At the hospital follow up visit please address:  1. Acute toxic encephalopathy 2/2 polypharmacy: Stroke, seizure, infectious work-up negative. Patient on 5 centrally-active medications. Recommend patient hold all except oxycodone (once daily) for pain control and prevent withdrawal. Encouraged patient to take up to four Tylenol 630m per day for further pain control. Recommend further medication review for PCP to reduce centrally-acting medications to prevent further encephalopathic episodes and reduce risk of falls and injuries given patient's age.   2. Type 2 diabetes mellitus: Recommend patient take Lantus 30u every day (previously only taking 40u when night time sugars >200). Patient usually does not take meal time coverage as well. A1c 9.6 during admission. Patient could benefit from more consistent insulin regimen and tighter glycemic control.  3. Hypertension: Patient hypertensive in ED, not on current medications. Decreased with losartan 235m Patient would likely benefit from antihypertensive medication if blood pressure continues to be elevated.  4. Iron deficiency anemia: Fe 33, sat 9%.  Could be contributing to patient's chronic cramps. Given one dose IV Feraheme and recommend patient take iron supplements after discharge.  5. Labs / imaging needed at time of follow-up: CBC, CMP  6.  Pending labs/ test needing follow-up: n/a  Follow-up Appointments:     Follow-up Information    Everardo Beals, NP. Schedule an appointment as soon as possible for a visit in 1 week(s).   Why: Please make an appointment within the next week for a hospital follow-up appointment Contact information: [place] [address] [phone]        [facility] Follow up.   Specialty: Page Why: The home health agency will contact you for the first home visit Contact information: [address] [place] [address] [place] [address] [phone]        Frann Rider, NP. Go on 10/22/2020.   Specialty: Neurology Contact information: [address] [place]  [address] [phone]              Hospital Course by problem list: 1. Acute toxic encephalopathy 2/2 polypharmacy: On arrival patient disoriented, concern for stroke given hx prior CVA.  CT, MRI without acute hemorrhage or infarct. EEG without epileptic or seizure-like activity. Electrolytes normal. Patient without respiratory symptoms, not on CPAP at home. She takes five centrally-acting medications, including oxycodone, gabapentin, robaxin, baclofen, and oxybutynin. Medications were held and patient's encephalopathy resolved. Encouraged patient to stop taking these medications. However, discussed continuing 1 oxycodone per day for pain control and to prevent withdrawal symptoms. Also encouraged use of four 674m Tylenol per day, as liver function was normal. Patient to follow-up with PCP for further medication review.  2. Type 2 diabetes mellitus: A1c 9.6. At home, patient only taking long-acting insulin 40 units if night time sugars were >200 to prevent hypoglycemic events. Discussed with patient she should take insulin daily for tighter glycemic control. Recommending 30u long-acting daily and regular use of meal-time insulin.  3. Hypertension: Patient hypertensive during stay, improved with losartan 222m Not on any antihypertensives at home. Recommend further evaluation with PCP.  4. Iron  deficiency anemia: Fe 33, sat 9%. Possibly exacerbating muscle cramps. Gave one dose IV Feraheme. Recommend patient take iron supplements at discharge and follow-up with primary care.  Discharge Vitals:   BP (!) 150/78 (BP Location: Right Arm)   Pulse 70   Temp 98.2 F (36.8 C) (Oral)   Resp 18   Ht _0  (1.651 m)   Wt 98.9 kg   SpO2 100%   BMI 36.28 kg/m   Pertinent Labs, Studies, and Procedures:  CBC Latest Ref Rng & Units 10/01/2020 09/30/2020 09/30/2020  WBC 4.0 - 10.5 K/uL 7.3 - -  Hemoglobin 12.0 - 15.0 g/dL 10.9(L) 12.9 13.6  Hematocrit 36 - 46 % 34.6(L) 38.0 40.0  Platelets 150 - 400 K/uL 228 - -   BMP Latest Ref Rng & Units 10/01/2020 09/30/2020 09/30/2020  Glucose 70 - 99 mg/dL 251(H) - 358(H)  BUN 8 - 23 mg/dL 11 - 16  Creatinine 0.44 - 1.00 mg/dL 0.80 - 0.80  BUN/Creat Ratio 12 - 28 - - -  Sodium 135 - 145 mmol/L 139 137 138  Potassium 3.5 - 5.1 mmol/L 3.3(L) 4.1 4.2  Chloride 98 - 111 mmol/L 103 - 100  CO2 22 - 32 mmol/L 26 - -  Calcium 8.9 - 10.3 mg/dL 8.6(L) - -   Lipid Panel     Component Value Date/Time  CHOL 128 10/01/2020 0400   TRIG 283 (H) 10/01/2020 0400   HDL 22 (L) 10/01/2020 0400   CHOLHDL 5.8 10/01/2020 0400   VLDL 57 (H) 10/01/2020 0400   LDLCALC 49 10/01/2020 0400   Hepatic Function Latest Ref Rng & Units 10/01/2020 09/30/2020 03/27/2020  Total Protein 6.5 - 8.1 g/dL 5.9(L) 6.4(L) 7.0  Albumin 3.5 - 5.0 g/dL 2.9(L) 3.3(L) 3.5  AST 15 - 41 U/L 14(L) 16 20  ALT 0 - 44 U/L _0 Alk Phosphatase 38 - 126 U/L 99 130(H) 128(H)  Total Bilirubin 0.3 - 1.2 mg/dL 0.6 0.6 0.6   Iron/TIBC/Ferritin/ %Sat    Component Value Date/Time   IRON 33 10/01/2020 0400   TIBC 358 10/01/2020 0400   FERRITIN 16 10/01/2020 0400   IRONPCTSAT 9 (L) 10/01/2020 0400   A1c 11/15: 9.6  CT Head 11/14: No acute finding by CT. Old lacunar infarction left basal ganglia/radiating white matter tracts.  CTA Head/Neck 11/14:  1. No acute large or medium vessel  occlusion. Negative perfusion study. 2. Aortic atherosclerosis. 30% stenoses at the left common carotid artery and left subclavian artery origins. 3. Previous right carotid endarterectomy with wide patency at the endarterectomy site. 4. Atherosclerotic plaque of both common carotid arteries. Minimal diameter on the right proximal to the endarterectomy site is 2.3 mm. Minimal diameter on the left in the midportion is 3.3 mm. 5. Atherosclerotic disease at the left carotid bifurcation but without stenosis. 6. 50% stenosis of the left vertebral artery origin. Additional 50% stenosis of the left vertebral artery at about the C7 level.  MRI brain 11/14: 1. No acute intracranial abnormality identified on this motion degraded study. 2. Mild chronic small vessel ischemic disease.  TTE 11/15: 1. Left ventricular ejection fraction, by estimation, is 60 to 65%. The  left ventricle has normal function. The left ventricle has no regional  wall motion abnormalities. Left ventricular diastolic parameters are  consistent with Grade I diastolic  dysfunction (impaired relaxation).  2. Right ventricular systolic function is normal. The right ventricular  size is normal.  3. The mitral valve is normal in structure. No evidence of mitral valve  regurgitation. No evidence of mitral stenosis.  4. The aortic valve is tricuspid. Aortic valve regurgitation is not  visualized. Mild aortic valve sclerosis is present, with no evidence of  aortic valve stenosis.  5. The inferior vena cava is normal in size with greater than 50%  respiratory variability, suggesting right atrial pressure of 3 mmHg.   EEG 11/15: This study is suggestive of mild diffuse encephalopathy, nonspecific etiology. No seizures or epileptiform discharges were seen throughout the recording.  Discharge Instructions:    Discharge Instructions    Call MD for:  difficulty breathing, headache or visual disturbances   Complete by: As  directed    Call MD for:  extreme fatigue   Complete by: As directed    Call MD for:  hives   Complete by: As directed    Call MD for:  persistant dizziness or light-headedness   Complete by: As directed    Call MD for:  persistant nausea and vomiting   Complete by: As directed    Call MD for:  redness, tenderness, or signs of infection (pain, swelling, redness, odor or green/yellow discharge around incision site)   Complete by: As directed    Call MD for:  severe uncontrolled pain   Complete by: As directed    Call MD for:  temperature >100.4  Complete by: As directed    Diet - low sodium heart healthy   Complete by: As directed    Discharge instructions   Complete by: As directed    Ms. Foister, I am glad you are feeling better! You were admitted because you were confused and we were not sure why. During our work-up, we found you did not have a stroke and found no source of active infection. Your electrolytes were normal and did not find any abnormalities. You are taking a few medications that can affect your brain. We recommend holding these medications until you can talk with your primary care physician. Please see the following notes:  -We recommend STOPPING: gabapentin, robaxin (methocarbamol), oxybutynin, baclofen. These medications have side effects on the brain that can cause confusion. This can especially happen with patients, such as yourself, that have chronic small vessel disease in the brain.  -We do not want you to experience withdrawal symptoms, so we recommend you continue taking the oxycodone. We do, however, recommend that you only take one of these per day and only take twice per day if absolutely necessary. If you become confused again, we recommend stopping the medication all together.  -You can take Tylenol for the pain as well. You can take one 662m Tylenol every 6 hours. Your liver function is normal on our lab work and Tylenol does not have the same side effects  on the brain that oxycodone has.  -In addition, we recommend taking Lantus 30u every single night. Your A1c (average of three months' sugars) is 9.6, which is elevated. This level puts you at a higher risk for cardiovascular events, such as heart attack and stroke. You can discuss with your doctor on how to adjust the insulin moving forward to optimize sugars. You likely will need to take some insulin with every meal as well. Ideally, your sugars should stay between 100-140.  -We also gave you some IV iron while you were in the hospital, as your iron was a bit low. This can cause the cramps that you have experienced. We recommend starting iron supplements at home.  -Please make sure to schedule a hospital follow-up appointment within the next week with your primary care doctor.  It was a pleasure meeting you, Ms. JBrian I wish you the best and hope you stay happy and healthy!  Thank you, PSanjuan Dame MD   Increase activity slowly   Complete by: As directed      Signed: BSanjuan Dame MD 10/01/2020, 12:18 PM   Pager: [phone]

## 2020-10-01 NOTE — Evaluation (Signed)
Physical Therapy Evaluation Patient Details Name: [name] MRN: [id number] DOB: May 11, 1946 Today's Date: 10/01/2020   History of Present Illness  Patient is a 74 y/o female who presents with AMS and speech difficulties. NIH:8. MRI and CT-negative. Noted to have extensive bilateral carotid artery stenoses with left ICA showing plague. PMH includes CVA, DM, HLD, peripheral neuropathy, overactive bladder.  Clinical Impression  Patient presents with generalized weakness, impaired balance, impaired cognition and impaired mobility s/p above. Pt lives at home with her spouse and reports being Mod I for ADls and ambulation using SPC or RW for safety. Uses w/c for long distances. Watches her 88 year old granddaughter once/week. Today, pt requires Min guard for transfers and gait training with use of RW for support. Recommend use of RW at home to decrease fall risk until strength/balance improve. Continues to have some cognitive deficits relating to memory, safety. Daughter checks on pt daily. Will follow acutely to maximize independence and mobility prior to return home.    Follow Up Recommendations Home health PT;Supervision for mobility/OOB    Equipment Recommendations  None recommended by PT    Recommendations for Other Services       Precautions / Restrictions Precautions Precautions: Fall Precaution Comments: hx of falls Restrictions Weight Bearing Restrictions: No      Mobility  Bed Mobility Overal bed mobility: Needs Assistance Bed Mobility: Rolling;Sidelying to Sit Rolling: Supervision Sidelying to sit: Supervision;HOB elevated       General bed mobility comments: Heavy use of rail which pt has at home. Increased time.    Transfers Overall transfer level: Needs assistance Equipment used: Rolling walker (2 wheeled) Transfers: Sit to/from Stand Sit to Stand: Min guard         General transfer comment: Min guard for safety. Stood from [employer], from toilet x1,  transferred to chair post ambulation.  Ambulation/Gait Ambulation/Gait assistance: Min guard;Min assist Gait Distance (Feet): 15 Feet (+25') Assistive device: Rolling walker (2 wheeled) Gait Pattern/deviations: Step-through pattern;Decreased stride length;Narrow base of support;Trunk flexed Gait velocity: decreased Gait velocity interpretation: <1.8 ft/sec, indicate of risk for recurrent falls General Gait Details: Slow, mildly unsteady gait with RW for support. no LOB.  Stairs            Wheelchair Mobility    Modified Rankin (Stroke Patients Only)       Balance Overall balance assessment: History of Falls;Needs assistance Sitting-balance support: Feet supported;No upper extremity supported Sitting balance-Leahy Scale: Fair     Standing balance support: During functional activity Standing balance-Leahy Scale: Poor Standing balance comment: Requires UE support in standing. Total A for pericare. "I don't think i can do it."                             Pertinent Vitals/Pain Pain Assessment: No/denies pain    Home Living Family/patient expects to be discharged to:: Private residence Living Arrangements: Spouse/significant other Available Help at Discharge: Family;Available 24 hours/day Type of Home: House Home Access: Ramped entrance     Home Layout: One level Home Equipment: Bedside commode;Shower seat;Walker - 2 wheels;Cane - single point;Grab bars - tub/shower;Other (comment);Wheelchair - [employer] - 4 wheels      Prior Function Level of Independence: Independent with assistive device(s)         Comments: 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 Does ADLs. Drives minimally. Reports falls,most recently being 1 week ago.     Hand Dominance  Dominant Hand: Right    Extremity/Trunk Assessment   Upper Extremity Assessment Upper Extremity Assessment: Defer to OT evaluation    Lower Extremity Assessment Lower  Extremity Assessment: Generalized weakness (but functional)       Communication   Communication: No difficulties  Cognition Arousal/Alertness: Awake/alert Behavior During Therapy: WFL for tasks assessed/performed Overall Cognitive Status: No family/caregiver present to determine baseline cognitive functioning                                 General Comments: Able to state name, date (by looking at wrist band) and location. Vaguely aware of why she is here but does not recall events of yesterday. "I am fine, ready to go home." Follows commands well.      General Comments General comments (skin integrity, edema, etc.): Daughter present at end of session and reports pt seems to be closer cognitive baseline today.    Exercises     Assessment/Plan    PT Assessment Patient needs continued PT services  PT Problem List Decreased strength;Decreased mobility;Decreased safety awareness;Decreased balance;Decreased cognition       PT Treatment Interventions Therapeutic activities;Gait training;Therapeutic exercise;Balance training;Functional mobility training;Patient/family education;Cognitive remediation    PT Goals (Current goals can be found in the Care Plan section)  Acute Rehab PT Goals Patient Stated Goal: to go home PT Goal Formulation: With patient Time For Goal Achievement: 10/15/20 Potential to Achieve Goals: Good    Frequency Min 3X/week   Barriers to discharge        Co-evaluation               AM-PAC PT "6 Clicks" Mobility  Outcome Measure Help needed turning from your back to your side while in a flat bed without using bedrails?: A Little Help needed moving from lying on your back to sitting on the side of a flat bed without using bedrails?: A Little Help needed moving to and from a bed to a chair (including a wheelchair)?: A Little Help needed standing up from a chair using your arms (e.g., wheelchair or bedside chair)?: A Little Help needed to  walk in hospital room?: A Little Help needed climbing 3-5 steps with a railing? : A Little 6 Click Score: 18    End of Session Equipment Utilized During Treatment: Gait belt Activity Tolerance: Patient tolerated treatment well Patient left: in chair;with call bell/phone within reach;with chair alarm set;with nursing/sitter in room;with family/visitor present Nurse Communication: Mobility status PT Visit Diagnosis: Muscle weakness (generalized) (M62.81);Unsteadiness on feet (R26.81)    Time: 9628-3662 PT Time Calculation (min) (ACUTE ONLY): 27 min   Charges:   PT Evaluation $PT Eval Moderate Complexity: 1 Mod PT Treatments $Gait Training: 8-22 mins        Marisa Severin, PT, DPT Acute Rehabilitation Services Pager [phone] Office [place] 10/01/2020, 9:15 AM

## 2020-10-01 NOTE — Progress Notes (Addendum)
Admitted Ms Autie Vasudevan a39year old White  female from [facility] ED with code stroke. Pt alert awake and oriented t person place ,but disoriented with month and year.  Pt reoriented to time,monthand year. Moves all extremities and follows.oriented to  m and use of call light to call for help. At all . Pt demostrated understanding with a teach back.  Fall and safety precaution in place skini tact no break down..  Cardiac momitor in use   10/01/20 0212  Vitals  Temp 98.1 F (36.7 C)  Temp Source Oral  BP (!) 166/59  MAP (mmHg) 90  BP Location Left Arm  BP Method Automatic  Patient Position (if appropriate) Lying  Pulse Rate 63  Pulse Rate Source Monitor  Resp 16   and verification complete with CCMD.

## 2020-10-01 NOTE — Progress Notes (Signed)
MD Ordered A STAT BMP  phlebotomy Numbers call but No Answers. RRT Nurse gave    Lab extent ion # [phone], nurse  called but no one answered. Will notify MD.

## 2020-10-02 LAB — URINE CULTURE: Culture: 40000 — AB

## 2020-10-05 LAB — CULTURE, BLOOD (ROUTINE X 2)
Culture: NO GROWTH
Culture: NO GROWTH
Special Requests: ADEQUATE

## 2020-10-22 ENCOUNTER — Ambulatory Visit: Payer: Medicare HMO | Admitting: Adult Health

## 2020-11-19 DIAGNOSIS — R109 Unspecified abdominal pain: Secondary | ICD-10-CM | POA: Diagnosis not present

## 2020-11-19 DIAGNOSIS — F4489 Other dissociative and conversion disorders: Secondary | ICD-10-CM | POA: Diagnosis not present

## 2020-11-19 DIAGNOSIS — E119 Type 2 diabetes mellitus without complications: Secondary | ICD-10-CM | POA: Diagnosis not present

## 2020-11-19 DIAGNOSIS — R69 Illness, unspecified: Secondary | ICD-10-CM | POA: Diagnosis not present

## 2020-11-21 DIAGNOSIS — M791 Myalgia, unspecified site: Secondary | ICD-10-CM | POA: Diagnosis not present

## 2020-11-21 DIAGNOSIS — G8929 Other chronic pain: Secondary | ICD-10-CM | POA: Diagnosis not present

## 2020-11-21 DIAGNOSIS — G894 Chronic pain syndrome: Secondary | ICD-10-CM | POA: Diagnosis not present

## 2020-11-21 DIAGNOSIS — E089 Diabetes mellitus due to underlying condition without complications: Secondary | ICD-10-CM | POA: Diagnosis not present

## 2020-11-21 DIAGNOSIS — M5440 Lumbago with sciatica, unspecified side: Secondary | ICD-10-CM | POA: Diagnosis not present

## 2020-11-21 DIAGNOSIS — M5417 Radiculopathy, lumbosacral region: Secondary | ICD-10-CM | POA: Diagnosis not present

## 2020-11-21 DIAGNOSIS — R69 Illness, unspecified: Secondary | ICD-10-CM | POA: Diagnosis not present

## 2020-11-23 ENCOUNTER — Telehealth: Payer: Self-pay

## 2020-11-23 NOTE — Telephone Encounter (Signed)
Patient's daughter called to report some aphasia that has started after Christmas. She is unsure how long the episodes last and her mother is "not acting right." She is s/p CEA in May 2021. A CT was performed in November and showed widely patent carotid arteries bilaterally. Given this, we discussed stroke s/s and when to call 911. Advised daughter to call neurology and set up follow up appt and to keep appt with [place] in February. She knows to call if there are further issues.

## 2020-12-07 DIAGNOSIS — R197 Diarrhea, unspecified: Secondary | ICD-10-CM | POA: Diagnosis not present

## 2020-12-07 DIAGNOSIS — E119 Type 2 diabetes mellitus without complications: Secondary | ICD-10-CM | POA: Diagnosis not present

## 2020-12-07 DIAGNOSIS — L03119 Cellulitis of unspecified part of limb: Secondary | ICD-10-CM | POA: Diagnosis not present

## 2020-12-12 ENCOUNTER — Ambulatory Visit: Payer: Medicare HMO | Admitting: Adult Health

## 2020-12-12 ENCOUNTER — Encounter: Payer: Self-pay | Admitting: Adult Health

## 2020-12-12 VITALS — BP 153/63 | HR 63 | Ht 60.0 in | Wt 157.8 lb

## 2020-12-12 DIAGNOSIS — R410 Disorientation, unspecified: Secondary | ICD-10-CM

## 2020-12-12 DIAGNOSIS — I6381 Other cerebral infarction due to occlusion or stenosis of small artery: Secondary | ICD-10-CM

## 2020-12-12 DIAGNOSIS — I6521 Occlusion and stenosis of right carotid artery: Secondary | ICD-10-CM | POA: Diagnosis not present

## 2020-12-12 NOTE — Patient Instructions (Signed)
Continue aspirin 81 mg daily and clopidogrel 75 mg daily  and atorvastatin  for secondary stroke prevention  Continue to follow up with PCP regarding cholesterol, blood pressure and diabetes management  Maintain strict control of hypertension with blood pressure goal below 130/90, diabetes with hemoglobin A1c goal below 7% and cholesterol with LDL cholesterol (bad cholesterol) goal below 70 mg/dL.      Followup in the future with me in 6 months or call earlier if needed       Thank you for coming to see [place] at [facility]. I hope we have been able to provide you high quality care today.  You may receive a patient satisfaction survey over the next few weeks. We would appreciate your feedback and comments so that we may continue to improve ourselves and the health of our patients.

## 2020-12-12 NOTE — Progress Notes (Signed)
[facility] [address] [place]. [place] [address] [phone]       STROKE FOLLOW UP NOTE  [name] Date of Birth:  1946-10-30 Medical Record Number:  [id number]   Reason for Referral:  stroke follow up    CHIEF COMPLAINT:  Chief Complaint  Patient presents with  . Follow-up    Rm 14 with daughter (kim) Pt states she has some pain in feet and sometimes hands, memory has been up and down but the last it was a UTI, tested negative this time for UTI. Has headaches as well    HPI:   Today, 12/12/2020, [name] returns for 75-monthstroke follow-up accompanied by her daughter and great-grandmother. Per daughter, episode of confusional event in November which was felt associated with polypharmacy and additional confusional event New Year's weekend with evidence of hypoglycemia and resolution of confusion after glucose level stabilized.  Have since adjusted insulin regimen without any additional hypoglycemic events or memory changes.  Post stroke deficits of occasional word finding difficulty and RLE weakness slowly improving.  Continues to use cane at all times and denies any recent falls.  Remains on aspirin 81 mg daily and Plavix 75 mg daily without bleeding or bruising.  Remains on atorvastatin 40 mg daily without myalgias.  Blood pressure today 153/63.  They do not routinely monitor at home.  No further concerns at this time.   History provided for reference purposes only Update 05/07/2020 JM: Ms. JTroostreturns for follow-up regarding left BG stroke in 11/2019 accompanied by.  Residual deficits of occasional word finding difficulty and RLE weakness but overall greatly improving. Continues to ambulate with rollator walker when out doors but when inside, able to ambulate without assistive device.  Denies new stroke/TIA symptoms.  She did undergo uneventful right CEA on 03/28/2020 by Dr. EDonnetta Hutchingfor asymptomatic right ICA 80% stenosis.  Follow-up visit recently  with Dr. EDonnetta Hutchingrecommended continuation of DAPT and repeat carotid Doppler 9 months post procedure.  Continues on aspirin 834mand plavix (per [facility] recommendations) and atorvastatin for secondary stroke prevention.  Blood pressure today 151/61.  No concerns at this time.  Initial visit 01/17/2020 JM: [name] a 75year old female who is being seen today for hospital follow-up accompanied by her daughter.  Residual deficits of mild expressive aphasia and right sided weakness but overall greatly improving. Currently working with home health PT and nursing.  Ambulates with Rollator walker without recent falls.  She was not seen/evaluated by speech therapy as recommend. She does live with husband but family will stop in to check on her throughout the day. She continues on aspirin, plavix and atorvastatin for secondary stroke prevention without side effects.  Blood pressure today 134/60.  She did have evaluation by the vascular surgery Dr. EaDonnetta Hutchingho recommended proceeding with right carotid endarterectomy for severe asymptomatic right ICA stenosis and recommended awaiting several months for stroke recovery.  No further concerns at this time.  Stroke admission 12/13/2019: [name] a [age] female with history of insulin-dependent diabetes mellitus, hypertension, COPD with recent admission for pyelonephritison 1/12,incidentally found to be COVID-19 positive presented on 12/13/2019 with increasing fatigue found to have glucose of 42 and R sided weakness.  Evaluated by stroke team and Dr. XuErlinda Hongith stroke work-up revealing left basal ganglia infarct likely secondary to small vessel disease source.  CTA neck showed right ICA bulb 80% stenosis and left VA origin 55% stenosis.  Carotid Doppler right 40 to  59% stenosis and left 40 to 59% stenosis.  MRA head unremarkable.  2D echo unremarkable.  Recommended DAPT for 3 weeks and aspirin alone.  Repeat Covid test 1/26 positive.  Encephalopathy with EEG showed  generalized slowing felt likely related to hypoglycemia episode, stroke and hypokalemia.  Right carotid stenosis asymptomatic at present time and recommended follow-up with [facility]VS outpatient.  HTN stable.  LDL 77 and initiated atorvastatin 40 mg daily.  Type 2 diabetes with frequent hypoglycemic events and A1c 5.5.  Current tobacco use with smoking cessation counseling provided.  Other stroke risk factors include advanced age and obesity but no prior history of stroke.  Other active problems include history of cervical cancer s/p XRT 1994, hypothyroidism and pyelonephritis.  Discharged home in stable condition recommendation home health SLP and PT.  Stroke:   L basal ganglia infarct, likely secondary to small vessel disease source.   Code Stroke age indeterminate L basal ganglia lacune new since 2015.   MRI  Acute L basal ganglia infarct. Small vessel disease.   MRA  Unremarkable   Carotid Doppler  R 40-59%, L 40-59%  CTA neck right ICA bulb 80% stenosis. Left VA origin 55% stenosis  2D Echo EF 60-65%  LDL 77  HgbA1c 5.5  Lovenox 40 mg sq daily for VTE prophylaxis  No antithrombotic prior to admission, now on ASA 81 and plavix 50m DAPT. Continue for 3 weeks and then ASA alone  Therapy recommendations:  SLP, HH PT  Disposition:  home   ROS:   14 system review of systems performed and negative with exception of those listed in HPI  PMH:  Past Medical History:  Diagnosis Date  . Arthritis   . Basal ganglia stroke ([place]   . Bilateral lower extremity edema   . Cancer ([place]   . Carotid artery occlusion   . Flank pain   . Full dentures   . History of cervical cancer    11/ 1994  Stage IB  s/p  high dose radiation brachytherapy @ [facility] 01/ 1995--- per pt no recurrence  . History of chronic bronchitis   . History of hyperthyroidism    d 03/ 2011---due to grave's disease--- s/p  RAI treatment 04/ 2011  . History of kidney stones   . History of sepsis 05/03/2018   due to UTI with  klebsiella/ pyelonephritis/ ureteral obstruction cause by stone  . Hyperlipidemia   . Hypothyroidism, postradioiodine therapy    endocrinologist--  dr eLoanne Drilling-  dx graves disease and s/p RAI i131 treatement 4/ 2011  . Nauseated   . Oral thrush 05/03/2018  . Pneumonia   . Type 2 diabetes mellitus treated with insulin ([facility]    FOLLOWED BY PCP  . Urgency of urination   . Wears glasses     PSH:  Past Surgical History:  Procedure Laterality Date  . APPENDECTOMY    . CAROTID ENDARTERECTOMY Right 03/28/2020  . CATARACT EXTRACTION W/ INTRAOCULAR LENS  IMPLANT, BILATERAL  2004  approx.  . CYSTOSCOPY WITH STENT PLACEMENT Right 05/04/2018   Procedure: CYSTOSCOPY WITH STENT PLACEMENT AND RETROGRADE PYELOGRAM;  Surgeon: WCeasar Mons MD;  Location: [facility];  Service: Urology;  Laterality: Right;  . CYSTOSCOPY/URETEROSCOPY/HOLMIUM LASER/STENT PLACEMENT Right 05/19/2018   Procedure: CYSTOSCOPY, URETEROSCOPY/HOLMIUM LASER, STONE BASKETRY/ STENT EXCHANGE;  Surgeon: WCeasar Mons MD;  [facility]  Service: Urology;  Laterality: Right;  . ENDARTERECTOMY Right 03/28/2020   Procedure: RIGHT CAROTID ENDARTERECTOMY with PATCH ANGIOPLASTY;  Surgeon: ERosetta Posner  MD;  Location: [facility];  Service: Vascular;  Laterality: Right;  . EXCISION MASS LEFT CHEST WALL  09-20-2007   dr Grandville Silos  [facility]  . Radioactive Iodine Therapy     for thyroid  . REVISION TOTAL HIP ARTHROPLASTY Left early 2000s  . TANDEM RING INSERTION  1995   dr Aldean Ast @ [facility]   EUA w/ tandem placement in ovid  (for direct high dose radiation brachytherapy , cervical cancer)  . TOTAL HIP ARTHROPLASTY Left 1990s  . TRANSTHORACIC ECHOCARDIOGRAM  08/07/2017   mild focal basal hypertrophy of the septum,  ef 34-19%, grade 1 diastolic dysfunction/  atrial septum with lipomatous hypertrophy/  trivial PR    Social History:  Social History   Socioeconomic History  . Marital status: Married     Spouse name: Not on file  . Number of children: 2  . Years of education: Not on file  . Highest education level: Not on file  Occupational History  . Occupation: Disabled  Tobacco Use  . Smoking status: Former Smoker    Years: 5.00    Types: Cigarettes    Quit date: 05/13/1982    Years since quitting: 38.6  . Smokeless tobacco: Never Used  Vaping Use  . Vaping Use: Never used  Substance and Sexual Activity  . Alcohol use: No  . Drug use: No  . Sexual activity: Not Currently  Other Topics Concern  . Not on file  Social History Narrative   Married and lives with husband   Has 2 children    Housewife   Disabled   2-3 cups of caffeine    R handed    Social Determinants of Health   Financial Resource Strain: Not on file  Food Insecurity: Not on file  Transportation Needs: Not on file  Physical Activity: Not on file  Stress: Not on file  Social Connections: Not on file  Intimate Partner Violence: Not on file    Family History:  Family History  Problem Relation Age of Onset  . Emphysema Father   . Diabetes Father   . Emphysema Sister   . Emphysema Brother   . Cancer Mother        Skin Cancer  . Diabetes Mother     Medications:   Current Outpatient Medications on File Prior to Visit  Medication Sig Dispense Refill  . acetaminophen (TYLENOL) 500 MG tablet Take 500 mg by mouth every 6 (six) hours as needed for headache (pain).    Marland Kitchen aspirin EC 81 MG EC tablet Take 1 tablet (81 mg total) by mouth daily. 30 tablet 1  . atorvastatin (LIPITOR) 40 MG tablet Take 1 tablet (40 mg total) by mouth daily at 6 PM. (Patient taking differently: Take 40 mg by mouth at bedtime.) 30 tablet 1  . cephALEXin (KEFLEX) 250 MG capsule Take 250 mg by mouth daily.     . clopidogrel (PLAVIX) 75 MG tablet Take 1 tablet (75 mg total) by mouth daily. 21 tablet 0  . ergocalciferol (VITAMIN D2) 50000 UNITS capsule Take 50,000 Units by mouth every Sunday.     Marland Kitchen gemfibrozil (LOPID) 600 MG tablet  Take 600 mg by mouth 2 (two) times daily.    . insulin degludec (TRESIBA FLEXTOUCH) 100 UNIT/ML FlexTouch Pen Inject 30 Units into the skin at bedtime. 3 mL 0  . levothyroxine (SYNTHROID) 100 MCG tablet Take 1 tablet (100 mcg total) by mouth daily before breakfast. 30 tablet 1  . metFORMIN (GLUCOPHAGE) 500 MG tablet Take  500 mg by mouth 2 (two) times daily with a meal.    . nystatin cream (MYCOSTATIN) Apply 1 application topically daily as needed (vaginal itching). Mix with triamcinolone cream    . oxyCODONE ER (XTAMPZA ER) 9 MG C12A Take 9 mg by mouth every 12 (twelve) hours.    . triamcinolone cream (KENALOG) 0.1 % Apply 1 application topically daily as needed (vaginal itching). Mix with nystatin cream     No current facility-administered medications on file prior to visit.    Allergies:   Allergies  Allergen Reactions  . Codeine Nausea Only     Physical Exam  Vitals:   12/12/20 1456  BP: (!) 153/63  Pulse: 63  Weight: 157 lb 12.8 oz (71.6 kg)  Height: 5' (1.524 m)   Body mass index is 30.82 kg/m. No exam data present  General: well developed, well nourished, very pleasant elderly Caucasian female, seated, in no evident distress Head: head normocephalic and atraumatic.   Neck: supple with no carotid or supraclavicular bruits Cardiovascular: regular rate and rhythm, no murmurs Musculoskeletal: no deformity Skin:  no rash/petichiae Vascular:  Normal pulses all extremities   Neurologic Exam Mental Status: Awake and fully alert.   Unable to appreciate speech or language impairment.  Oriented to place and time. Recent and remote memory intact. Attention span, concentration and fund of knowledge appropriate. Mood and affect appropriate.  Cranial Nerves: Pupils equal, briskly reactive to light. Extraocular movements full without nystagmus. Visual fields full to confrontation. Hearing intact. Facial sensation intact.  Mild right nasolabial fold flattening. Motor: Normal bulk and  tone.  RUE: 5/5 RLE: 4/5 hip flexor and 5/5 knee extension, knee flexion and ankle dorsiflexion LUE: 5/5 LLE: 4/5 hip flexor  Sensory.: intact to touch , pinprick , position and vibratory sensation.  Coordination: Rapid alternating movements normal in all extremities.  Finger-to-nose performed accurately bilaterally and heel-to-shin difficulty performing due to bilateral hip flexor weakness and history of left hip replacement. Gait and Station: Arises from chair without difficulty. Stance is normal. Gait demonstrates  normal stride length and balance with use of Rollator walker.   Reflexes: 1+ and symmetric. Toes downgoing.       ASSESSMENT: [name] is a 75 y.o. year old female presented with increasing fatigue, hypoglycemia and right-sided weakness on 12/13/2019 with stroke work-up revealing left basal ganglia infarct secondary to small vessel disease source. Vascular risk factors include recent COVID-19 infection, right carotid stenosis s/p R CEA 03/2020, HTN, HLD, DM with frequent hypoglycemic events, and history of tobacco use.  2 episodes of transient confusional events with first episode ED evaluation with stroke, seizure and infection work-up negative and felt likely due to polypharmacy and second episode found to be hypoglycemic   PLAN:  1. Left BG stroke:  a. Residual deficits of mild RLE weakness, and occasional perseveration b. Continue aspirin 81 mg daily and clopidogrel 75 mg daily  and atorvastatin for secondary stroke prevention.  No indication to continue Plavix at this time from a stroke standpoint but per daughter, was advised by Dr. Donnetta Hutching to continue DAPT until follow-up carotid ultrasound. Discussed secondary stroke prevention measures and importance of close PCP follow-up for aggressive stroke risk factor management 2. Transient confusion episode: CT head negative for acute abnormality 09/2020. Discussed symptoms of hypoglycemia as well as having acute infection such  as UTI.  Advised daughter to contact office if any additional events not associated with underlying cause for further evaluation. 3. Right carotid stenosis s/p CEA:  Continue to follow with vascular surgery Dr. Donnetta Hutching with repeat carotid duplex 9 months post procedure as well as ongoing use of aspirin and statin and importance of managing stroke risk factors    Follow up in 6 months or call earlier if needed   CC:  [place] provider: Dr. Mardella Layman, Joelene Millin, NP    I spent 30 minutes of face-to-face and non-face-to-face time with patient and daughter.  This included previsit chart review, lab review, study review, order entry, electronic health record documentation, patient education and discussion regarding above conditions and answered all questions to patient and daughter satisfaction   Frann Rider, [facility]  [facility] [address] [place], [place] [address]  Phone [phone] Fax [phone] Note: This document was prepared with digital dictation and possible smart phrase technology. Any transcriptional errors that result from this process are unintentional.

## 2020-12-13 ENCOUNTER — Encounter: Payer: Self-pay | Admitting: Adult Health

## 2020-12-17 ENCOUNTER — Other Ambulatory Visit: Payer: Self-pay

## 2020-12-17 ENCOUNTER — Other Ambulatory Visit: Payer: Self-pay | Admitting: Student

## 2020-12-17 ENCOUNTER — Encounter: Payer: Self-pay | Admitting: Podiatry

## 2020-12-17 ENCOUNTER — Ambulatory Visit (INDEPENDENT_AMBULATORY_CARE_PROVIDER_SITE_OTHER): Payer: Medicare HMO | Admitting: Podiatry

## 2020-12-17 DIAGNOSIS — M79674 Pain in right toe(s): Secondary | ICD-10-CM

## 2020-12-17 DIAGNOSIS — B351 Tinea unguium: Secondary | ICD-10-CM | POA: Diagnosis not present

## 2020-12-17 DIAGNOSIS — E1142 Type 2 diabetes mellitus with diabetic polyneuropathy: Secondary | ICD-10-CM

## 2020-12-17 DIAGNOSIS — L84 Corns and callosities: Secondary | ICD-10-CM | POA: Diagnosis not present

## 2020-12-17 DIAGNOSIS — M79675 Pain in left toe(s): Secondary | ICD-10-CM | POA: Diagnosis not present

## 2020-12-17 DIAGNOSIS — S90211A Contusion of right great toe with damage to nail, initial encounter: Secondary | ICD-10-CM

## 2020-12-17 NOTE — Patient Instructions (Signed)
Apply Neosporin to right great toe once daily for two weeks.

## 2020-12-17 NOTE — Progress Notes (Signed)
I agree with the above plan 

## 2020-12-17 NOTE — Progress Notes (Signed)
Subjective: [name] presents today preventative diabetic foot care with h/o preulcerative callus(es) left hallux and painful mycotic toenails b/l that are difficult to trim. Pain interferes with ambulation. Aggravating factors include wearing enclosed shoe gear. Pain is relieved with periodic professional debridement.   Patient states she stubbed her right great toe about one month ago. She noticed bleeding under the toenail. She states she also sometimes hits her toes on her walker.  Everardo Beals, NP is patient's PCP. Last visit was: 06/06/2020.  Past Medical History:  Diagnosis Date  . Arthritis   . Basal ganglia stroke ([place])   . Bilateral lower extremity edema   . Cancer ([place])   . Carotid artery occlusion   . Flank pain   . Full dentures   . History of cervical cancer    11/ 1994  Stage IB  s/p  high dose radiation brachytherapy @ [facility] 01/ 1995--- per pt no recurrence  . History of chronic bronchitis   . History of hyperthyroidism    d 03/ 2011---due to grave's disease--- s/p  RAI treatment 04/ 2011  . History of kidney stones   . History of sepsis 05/03/2018   due to UTI with klebsiella/ pyelonephritis/ ureteral obstruction cause by stone  . Hyperlipidemia   . Hypothyroidism, postradioiodine therapy    endocrinologist--  dr Loanne Drilling--  dx graves disease and s/p RAI i131 treatement 4/ 2011  . Nauseated   . Oral thrush 05/03/2018  . Pneumonia   . Type 2 diabetes mellitus treated with insulin ([place])    FOLLOWED BY PCP  . Urgency of urination   . Wears glasses      Current Outpatient Medications on File Prior to Visit  Medication Sig Dispense Refill  . acetaminophen (TYLENOL) 500 MG tablet Take 500 mg by mouth every 6 (six) hours as needed for headache (pain).    Marland Kitchen aspirin EC 81 MG EC tablet Take 1 tablet (81 mg total) by mouth daily. 30 tablet 1  . atorvastatin (LIPITOR) 40 MG tablet Take 1 tablet (40 mg total) by mouth daily at 6 PM. (Patient taking differently:  Take 40 mg by mouth at bedtime.) 30 tablet 1  . baclofen (LIORESAL) 10 MG tablet Take 10 mg by mouth 4 (four) times daily as needed.    . cephALEXin (KEFLEX) 250 MG capsule Take 250 mg by mouth daily.     . clopidogrel (PLAVIX) 75 MG tablet Take 1 tablet (75 mg total) by mouth daily. 21 tablet 0  . ergocalciferol (VITAMIN D2) 50000 UNITS capsule Take 50,000 Units by mouth every Sunday.     Marland Kitchen gemfibrozil (LOPID) 600 MG tablet Take 600 mg by mouth 2 (two) times daily.    . insulin degludec (TRESIBA FLEXTOUCH) 100 UNIT/ML FlexTouch Pen Inject 30 Units into the skin at bedtime. 3 mL 0  . levothyroxine (SYNTHROID) 100 MCG tablet Take 1 tablet (100 mcg total) by mouth daily before breakfast. 30 tablet 1  . metFORMIN (GLUCOPHAGE) 500 MG tablet Take 500 mg by mouth 2 (two) times daily with a meal.    . nystatin cream (MYCOSTATIN) Apply 1 application topically daily as needed (vaginal itching). Mix with triamcinolone cream    . oxyCODONE ER (XTAMPZA ER) 9 MG C12A Take 9 mg by mouth every 12 (twelve) hours.    . triamcinolone cream (KENALOG) 0.1 % Apply 1 application topically daily as needed (vaginal itching). Mix with nystatin cream     No current facility-administered medications on file prior to  visit.     Allergies  Allergen Reactions  . Codeine Nausea Only    Objective: GYPSY KELLOGG is a pleasant 75 y.o. Caucasian female in NAD. AAO x 3.   There were no vitals filed for this visit.  Vascular Examination: Neurovascular status unchanged b/l lower extremities. Capillary fill time to digits <3 seconds b/l lower extremities. Palpable pedal pulses b/l LE. Pedal hair absent. Lower extremity skin temperature gradient within normal limits. No pain with calf compression b/l.   Dermatological Examination: Pedal skin with normal turgor, texture and tone bilaterally. No open wounds bilaterally. No interdigital macerations bilaterally. Toenails 1-5 b/l elongated, dystrophic, thickened, crumbly with  subungual debris and tenderness to dorsal palpation. Porokeratotic lesion(s) plantar IPJ of L hallux. No erythema, no edema, no drainage, no flocculence.  Subungual hematoma noted of the right hallux. Appears to be subacute with dark heme under the toenail. Nailplate remains adhered. No erythema, no edema, no active drainage, no flocculence, no fluid expressed when pressure applied to digit.  Musculoskeletal: Normal muscle strength 5/5 to all lower extremity muscle groups bilaterally for plantar flexion, inversion and eversion. Dropfoot present b/l lower extremities. No pain crepitus or joint limitation noted with ROM b/l. Hammertoes noted to the L 2nd toe and R 2nd toe.  Neurological Examination: Protective sensation diminished with 10g monofilament b/l. Vibratory sensation decreased b/l.  Assessment: 1. Pain due to onychomycosis of toenails of both feet   2. Callus   3. Subungual hematoma of great toe of right foot, initial encounter   4. Diabetic peripheral neuropathy associated with type 2 diabetes mellitus ([place])     Plan: -For subungual hematoma, she is to apply Neosporin to right great toe once daily for two weeks. Call if she has any problems.  -Continue diabetic foot care principles. -Toenails 1-5 b/l were debrided in length and girth with sterile nail nippers and dremel without iatrogenic bleeding.  -Callus(es) left hallux pared utilizing sterile scalpel blade without complication or incident. Total number debrided =1. -Patient to report any pedal injuries to medical professional immediately. -Patient to continue soft, supportive shoe gear daily. -Patient/POA to call should there be question/concern in the interim.  Return in about 3 months (around 03/16/2021).  [name], DPM

## 2020-12-18 ENCOUNTER — Other Ambulatory Visit: Payer: Self-pay | Admitting: Student

## 2020-12-19 ENCOUNTER — Encounter: Payer: Self-pay | Admitting: Gastroenterology

## 2020-12-19 ENCOUNTER — Ambulatory Visit: Payer: Medicare HMO | Admitting: Gastroenterology

## 2020-12-19 ENCOUNTER — Telehealth: Payer: Self-pay

## 2020-12-19 VITALS — BP 162/78 | HR 88 | Ht 60.0 in | Wt 156.0 lb

## 2020-12-19 DIAGNOSIS — R1084 Generalized abdominal pain: Secondary | ICD-10-CM | POA: Diagnosis not present

## 2020-12-19 DIAGNOSIS — R197 Diarrhea, unspecified: Secondary | ICD-10-CM | POA: Diagnosis not present

## 2020-12-19 DIAGNOSIS — D509 Iron deficiency anemia, unspecified: Secondary | ICD-10-CM

## 2020-12-19 DIAGNOSIS — R112 Nausea with vomiting, unspecified: Secondary | ICD-10-CM

## 2020-12-19 MED ORDER — BISMUTH SUBSALICYLATE 262 MG/15ML PO SUSP: ORAL | 0 refills | Status: DC

## 2020-12-19 NOTE — Patient Instructions (Addendum)
RECOMMENDATION(S):    Please increase PeptoBismal to 2 teaspoons by mouth 3 times per day.  COLONOSCOPY AND ENDOSCOPY:   You have been scheduled for an endoscopy and colonoscopy. Please follow the written instructions given to you at your visit today.  PREP:   Please pick up your prep supplies at the pharmacy within the next 1-3 days.  INHALERS:   If you use inhalers (even only as needed), please bring them with you on the day of your procedure.  BMI:  If you are age 57 or older, your body mass index should be between 23-30. Your Body mass index is 30.47 kg/m. If this is out of the aforementioned range listed, please consider follow up with your Primary Care Provider.  Thank you for trusting me with your gastrointestinal care!    Thornton Park, MD, MPH

## 2020-12-19 NOTE — Telephone Encounter (Signed)
Thank you. Please obtain clearance from vascular surgery. Thanks.

## 2020-12-19 NOTE — Telephone Encounter (Signed)
[facility] Pre-operative Risk Assessment     [name] [birth year] [id number]  Procedure: EGD and Colonoscopy Procedure Date: 01/02/21 Provider: Dr. Tarri Glenn Practice: [facility]  Phone: [phone] Fax: [phone] ATTENTION: [name], LPN  Request for surgical clearance:  Endoscopy and Colonoscopy Procedure  Anesthesia type (None, local, MAC, general) ?  MAC  What type of clearance is required ?   Pharmacy  Medication(s) needing held: Plavix   Length of time to be held? 7 days

## 2020-12-19 NOTE — Telephone Encounter (Signed)
Stroke 11/2019 -has been stable since that time.  She has remained on aspirin and Plavix per vascular surgery recommendations.  From a stroke standpoint, okay to Plavix for 7 days prior to procedure with small but acceptable preprocedure risk of stroke while on therapy.  Recommend restarting immediately after once hemodynamically stable.  Would also recommend further clearance by vascular surgery in setting of carotid stenosis.

## 2020-12-19 NOTE — Telephone Encounter (Signed)
Please review and advise regarding further clearance by vascular

## 2020-12-19 NOTE — Progress Notes (Signed)
Referring Provider: Everardo Beals, NP Primary Care Physician:  Everardo Beals, NP  Reason for Consultation:  Abdominal pain, diarrhea   IMPRESSION:  Chronic diarrhea Abdominal pain Nausea and vomiting Iron deficiency anemia without overt or occult GI blood loss Chronic use of Plavix for CVA 11/2019 and prior right carotid endarterectomy 5/21  Endoscopic evaluation recommended as no source of symptoms or iron deficiency anemia identified on CT scan in 2020 or 2021.  Ideally, would hold Plavix for 5 days before endoscopy.  I discussed with the patient that there is a low, but real, risk of a cardiovascular event such as heart attack, stroke, or embolism/thrombosis while off Plavix. Will communicate by phone or EMR with patient's prescribing physician to confirm that holding the Plavix is appropriate at this time.   PLAN: Stool for GI pathogen panel, fecal calprotectin CRP Iron, ferritin, CBC Increase PeptoBismal 2 po TID Colonoscopy and EGD after a Plavix washout Needs a two day bowel prep  Please see the "Patient Instructions" section for addition details about the plan.  HPI: [name] is a 75 y.o. female who returns in follow-up with ongoing epigastric pain and diarrhea. [name] has a history of type 2 diabetes mellitus hypertension, CVA, carotid stenosis, ureteral stones, and hyperlipidemia.  Recent decline in cognitive function contributes to limitations in history taking.   Originally seen in 2019. Failed attempt at colonoscopy due to poor prep and retained stool at the time of colonoscopy 09/03/18.  Returns today with symptoms similar to those under investigation in 2019.  Near daily epigastric pain with vomiting. No identified triggers. Continues to have multiple, liquid, watery bowel movements daily. Wearing a pad due to stool leak. Noting dark brown stools while using PeptoBismal.  Stool 2020: FOBT negative, C diff neg Stool 2021: FOBT  negative 10/01/20: iron 33, ferritin 16, percent saturation 9, hemoglobin 10.9, MCV 83.8, RDW 14.5, platelets 228, albumin 2.9, normal liver enzymes  She was given one dose of Feraheme while hospitalized in November 2021  CT abd/pelvis with contrast 07/26/19: moderate stool, ascending colon lipoma, advanced atherosclerotic disease, no intra-abdominal or pelvic pathology, splenomegaly CT abd/pelvis with contrast 12/12/19: fluid filled colon, stable hematometra, mild fullness renal collecting system  There is no record of prior endoscopy in her electronic health record. She reports a colonoscopy in [place] over 10 years. She had polyps removed at that time.  Her daughter accompanies her but cannot remember it being in the last 10 years. She had no difficulty with the colonoscopy at that time.  No known family history of colon cancer or polyps. No family history of uterine/endometrial cancer, pancreatic cancer or gastric/stomach cancer.   Past Medical History:  Diagnosis Date  . Arthritis   . Basal ganglia stroke ([place])   . Bilateral lower extremity edema   . Cancer ([place])   . Carotid artery occlusion   . Flank pain   . Full dentures   . History of cervical cancer    11/ 1994  Stage IB  s/p  high dose radiation brachytherapy @ [facility] 01/ 1995--- per pt no recurrence  . History of chronic bronchitis   . History of hyperthyroidism    d 03/ 2011---due to grave's disease--- s/p  RAI treatment 04/ 2011  . History of kidney stones   . History of sepsis 05/03/2018   due to UTI with klebsiella/ pyelonephritis/ ureteral obstruction cause by stone  . Hyperlipidemia   . Hypothyroidism, postradioiodine therapy    endocrinologist--  dr Loanne Drilling--  dx graves disease and s/p RAI i131 treatement 4/ 2011  . Nauseated   . Oral thrush 05/03/2018  . Pneumonia   . Type 2 diabetes mellitus treated with insulin ([place])    FOLLOWED BY PCP  . Urgency of urination   . Wears glasses     Past Surgical History:   Procedure Laterality Date  . APPENDECTOMY    . CAROTID ENDARTERECTOMY Right 03/28/2020  . CATARACT EXTRACTION W/ INTRAOCULAR LENS  IMPLANT, BILATERAL  2004  approx.  . CYSTOSCOPY WITH STENT PLACEMENT Right 05/04/2018   Procedure: CYSTOSCOPY WITH STENT PLACEMENT AND RETROGRADE PYELOGRAM;  Surgeon: Ceasar Mons, MD;  Location: [facility];  Service: Urology;  Laterality: Right;  . CYSTOSCOPY/URETEROSCOPY/HOLMIUM LASER/STENT PLACEMENT Right 05/19/2018   Procedure: CYSTOSCOPY, URETEROSCOPY/HOLMIUM LASER, STONE BASKETRY/ STENT EXCHANGE;  Surgeon: Ceasar Mons, MD;  Location: [facility];  Service: Urology;  Laterality: Right;  . ENDARTERECTOMY Right 03/28/2020   Procedure: RIGHT CAROTID ENDARTERECTOMY with PATCH ANGIOPLASTY;  Surgeon: Rosetta Posner, MD;  Location: [facility];  Service: Vascular;  Laterality: Right;  . EXCISION MASS LEFT CHEST WALL  09-20-2007   dr Grandville Silos  [facility]  . Radioactive Iodine Therapy     for thyroid  . REVISION TOTAL HIP ARTHROPLASTY Left early 2000s  . TANDEM RING INSERTION  1995   dr Aldean Ast @ [facility]   EUA w/ tandem placement in ovid  (for direct high dose radiation brachytherapy , cervical cancer)  . TOTAL HIP ARTHROPLASTY Left 1990s  . TRANSTHORACIC ECHOCARDIOGRAM  08/07/2017   mild focal basal hypertrophy of the septum,  ef 26-71%, grade 1 diastolic dysfunction/  atrial septum with lipomatous hypertrophy/  trivial PR    Current Outpatient Medications  Medication Sig Dispense Refill  . acetaminophen (TYLENOL) 500 MG tablet Take 500 mg by mouth every 6 (six) hours as needed for headache (pain).    Marland Kitchen aspirin EC 81 MG EC tablet Take 1 tablet (81 mg total) by mouth daily. 30 tablet 1  . atorvastatin (LIPITOR) 40 MG tablet Take 1 tablet (40 mg total) by mouth daily at 6 PM. (Patient taking differently: Take 40 mg by mouth at bedtime.) 30 tablet 1  . baclofen (LIORESAL) 10 MG tablet Take 10 mg by mouth 4 (four) times daily as needed.    .  bismuth subsalicylate (PEPTO BISMOL) 262 MG/15ML suspension Please take 2 teaspoons by mouth TID 360 mL 0  . cephALEXin (KEFLEX) 250 MG capsule Take 250 mg by mouth daily.     . clopidogrel (PLAVIX) 75 MG tablet Take 1 tablet (75 mg total) by mouth daily. 21 tablet 0  . ergocalciferol (VITAMIN D2) 50000 UNITS capsule Take 50,000 Units by mouth every Sunday.     Marland Kitchen gemfibrozil (LOPID) 600 MG tablet Take 600 mg by mouth 2 (two) times daily.    . insulin degludec (TRESIBA FLEXTOUCH) 100 UNIT/ML FlexTouch Pen Inject 30 Units into the skin at bedtime. 3 mL 0  . levothyroxine (SYNTHROID) 100 MCG tablet Take 1 tablet (100 mcg total) by mouth daily before breakfast. 30 tablet 1  . metFORMIN (GLUCOPHAGE) 500 MG tablet Take 500 mg by mouth 2 (two) times daily with a meal.    . nystatin cream (MYCOSTATIN) Apply 1 application topically daily as needed (vaginal itching). Mix with triamcinolone cream    . oxyCODONE ER (XTAMPZA ER) 9 MG C12A Take 9 mg by mouth every 12 (twelve) hours.    . triamcinolone cream (KENALOG) 0.1 % Apply 1 application topically  daily as needed (vaginal itching). Mix with nystatin cream     No current facility-administered medications for this visit.    Allergies as of 12/19/2020 - Review Complete 12/19/2020  Allergen Reaction Noted  . Codeine Nausea Only 10/19/2008    Family History  Problem Relation Age of Onset  . Emphysema Father   . Diabetes Father   . Emphysema Sister   . Emphysema Brother   . Cancer Mother        Skin Cancer  . Diabetes Mother      Physical Exam: General:   Alert,  well-nourished, pleasant and cooperative in NAD Head:  Normocephalic and atraumatic. Eyes:  Sclera clear, no icterus.   Conjunctiva pink. Abdomen:  Soft, nontender, nondistended, normal bowel sounds, no rebound or guarding. No hepatosplenomegaly.   Rectal:  Deferred  Msk:  Symmetrical. No boney deformities LAD: No inguinal or umbilical LAD Extremities:  No clubbing or  edema. Neurologic:  Alert and  oriented x4;  grossly nonfocal Skin:  Intact without significant lesions or rashes. Psych:  Alert and cooperative. Normal mood and affect.   Haskell Rihn L. Tarri Glenn, MD, MPH 12/19/2020, 5:12 PM

## 2020-12-19 NOTE — Telephone Encounter (Signed)
error 

## 2020-12-19 NOTE — Telephone Encounter (Signed)
Per Dr. Tarri Glenn and Frann Rider, NP, routing this message to Dr. Donnetta Hutching for further review and instruction to this clearance from a vascular standpoint. Will await response

## 2020-12-19 NOTE — Telephone Encounter (Signed)
Hi, Ammie:  Our Janett Billow NP and Dr. Leonie Man saw her on 12/12/20 and I will let them involve in this. Thanks.   Rosalin Hawking, MD PhD Stroke Neurology 12/19/2020 4:18 PM

## 2020-12-20 NOTE — Telephone Encounter (Signed)
Stroke 11/2019 -has been stable since that time.  She has remained on aspirin and Plavix per vascular surgery recommendations.  From a stroke standpoint, okay to Plavix for 3-5 days prior to procedure with a small but acceptable preprocedure risk of stroke if acceptable to the patient.  Recommend restarting immediately after once hemodynamically stable.

## 2020-12-21 NOTE — Telephone Encounter (Signed)
Routing this message to Dr. Tarri Glenn to maker her aware of Vascular response to clearance request. Will await her response to ensure she deems appropriate to proceed with procedure before notifying pt.

## 2020-12-22 ENCOUNTER — Other Ambulatory Visit: Payer: Self-pay | Admitting: Student

## 2020-12-24 NOTE — Telephone Encounter (Signed)
Thank you. Will proceed with endoscopy. Thanks.

## 2020-12-24 NOTE — Telephone Encounter (Signed)
Called pt and informed her to hold her Plavix x 5 days prior to her procedure. Verbalized acceptance and understanding.

## 2020-12-28 ENCOUNTER — Encounter: Payer: Self-pay | Admitting: Gastroenterology

## 2021-01-02 ENCOUNTER — Other Ambulatory Visit: Payer: Self-pay

## 2021-01-02 ENCOUNTER — Ambulatory Visit (AMBULATORY_SURGERY_CENTER): Payer: Medicare HMO | Admitting: Gastroenterology

## 2021-01-02 ENCOUNTER — Encounter: Payer: Self-pay | Admitting: Gastroenterology

## 2021-01-02 VITALS — BP 157/66 | HR 67 | Temp 97.2°F | Resp 15 | Ht 60.0 in | Wt 156.0 lb

## 2021-01-02 DIAGNOSIS — K2950 Unspecified chronic gastritis without bleeding: Secondary | ICD-10-CM | POA: Diagnosis not present

## 2021-01-02 DIAGNOSIS — K2970 Gastritis, unspecified, without bleeding: Secondary | ICD-10-CM

## 2021-01-02 DIAGNOSIS — K2289 Other specified disease of esophagus: Secondary | ICD-10-CM | POA: Diagnosis not present

## 2021-01-02 DIAGNOSIS — R112 Nausea with vomiting, unspecified: Secondary | ICD-10-CM | POA: Diagnosis not present

## 2021-01-02 DIAGNOSIS — D509 Iron deficiency anemia, unspecified: Secondary | ICD-10-CM | POA: Diagnosis not present

## 2021-01-02 DIAGNOSIS — K3189 Other diseases of stomach and duodenum: Secondary | ICD-10-CM | POA: Diagnosis not present

## 2021-01-02 DIAGNOSIS — K529 Noninfective gastroenteritis and colitis, unspecified: Secondary | ICD-10-CM | POA: Diagnosis not present

## 2021-01-02 DIAGNOSIS — R197 Diarrhea, unspecified: Secondary | ICD-10-CM

## 2021-01-02 DIAGNOSIS — R1084 Generalized abdominal pain: Secondary | ICD-10-CM | POA: Diagnosis not present

## 2021-01-02 DIAGNOSIS — E119 Type 2 diabetes mellitus without complications: Secondary | ICD-10-CM | POA: Diagnosis not present

## 2021-01-02 MED ORDER — SODIUM CHLORIDE 0.9 % IV SOLN: 500.0000 mL | Freq: Once | INTRAVENOUS | Status: DC

## 2021-01-02 NOTE — Patient Instructions (Signed)
Please read handouts provided. Resume previous diet. Continue present medications, including Plavix. No aspirin, ibuprofen, naproxen, or other non-steriodal anti-inflammatory drugs. Await pathology results. Follow Dr. Tarri Glenn recommendation for bowel purge. Follow-up in the office with Dr. Tarri Glenn or Darrell Jewel.     YOU HAD AN ENDOSCOPIC PROCEDURE TODAY AT [facility]:   Refer to the procedure report that was given to you for any specific questions about what was found during the examination.  If the procedure report does not answer your questions, please call your gastroenterologist to clarify.  If you requested that your care partner not be given the details of your procedure findings, then the procedure report has been included in a sealed envelope for you to review at your convenience later.  YOU SHOULD EXPECT: Some feelings of bloating in the abdomen. Passage of more gas than usual.  Walking can help get rid of the air that was put into your GI tract during the procedure and reduce the bloating. If you had a lower endoscopy (such as a colonoscopy or flexible sigmoidoscopy) you may notice spotting of blood in your stool or on the toilet paper. If you underwent a bowel prep for your procedure, you may not have a normal bowel movement for a few days.  Please Note:  You might notice some irritation and congestion in your nose or some drainage.  This is from the oxygen used during your procedure.  There is no need for concern and it should clear up in a day or so.  SYMPTOMS TO REPORT IMMEDIATELY:   Following lower endoscopy (colonoscopy or flexible sigmoidoscopy):  Excessive amounts of blood in the stool  Significant tenderness or worsening of abdominal pains  Swelling of the abdomen that is new, acute  Fever of 100F or higher   Following upper endoscopy (EGD)  Vomiting of blood or coffee ground material  New chest pain or pain under the shoulder blades  Painful or  persistently difficult swallowing  New shortness of breath  Fever of 100F or higher  Black, tarry-looking stools  For urgent or emergent issues, a gastroenterologist can be reached at any hour by calling [phone]. Do not use MyChart messaging for urgent concerns.    DIET:  We do recommend a small meal at first, but then you may proceed to your regular diet.  Drink plenty of fluids but you should avoid alcoholic beverages for 24 hours.  ACTIVITY:  You should plan to take it easy for the rest of today and you should NOT DRIVE or use heavy machinery until tomorrow (because of the sedation medicines used during the test).    FOLLOW UP: Our staff will call the number listed on your records 48-72 hours following your procedure to check on you and address any questions or concerns that you may have regarding the information given to you following your procedure. If we do not reach you, we will leave a message.  We will attempt to reach you two times.  During this call, we will ask if you have developed any symptoms of COVID 19. If you develop any symptoms (ie: fever, flu-like symptoms, shortness of breath, cough etc.) before then, please call [phone].  If you test positive for Covid 19 in the 2 weeks post procedure, please call and report this information to [place].    If any biopsies were taken you will be contacted by phone or by letter within the next 1-3 weeks.  Please call us at [phone]  if you have not heard about the biopsies in 3 weeks.    SIGNATURES/CONFIDENTIALITY: You and/or your care partner have signed paperwork which will be entered into your electronic medical record.  These signatures attest to the fact that that the information above on your After Visit Summary has been reviewed and is understood.  Full responsibility of the confidentiality of this discharge information lies with you and/or your care-partner.

## 2021-01-02 NOTE — Op Note (Signed)
[place] Patient Name: [name] Procedure Date: 01/02/2021 1:25 PM MRN: [id number] Endoscopist: Thornton Park MD, MD Age: 75 Referring MD:  Date of Birth: 1946/06/11 Gender: Female Account #: [id number] Procedure:                Upper GI endoscopy Indications:              Generalized abdominal pain, Diarrhea, Nausea with                            vomiting Medicines:                Monitored Anesthesia Care Procedure:                Pre-Anesthesia Assessment:                           - Prior to the procedure, a History and Physical                            was performed, and patient medications and                            allergies were reviewed. The patient's tolerance of                            previous anesthesia was also reviewed. The risks                            and benefits of the procedure and the sedation                            options and risks were discussed with the patient.                            All questions were answered, and informed consent                            was obtained. Prior Anticoagulants: The patient has                            taken Plavix (clopidogrel), last dose was 5 days                            prior to procedure. ASA Grade Assessment: III - A                            patient with severe systemic disease. After                            reviewing the risks and benefits, the patient was                            deemed in satisfactory condition to undergo the  procedure.                           After obtaining informed consent, the endoscope was                            passed under direct vision. Throughout the                            procedure, the patient's blood pressure, pulse, and                            oxygen saturations were monitored continuously. The                            Endoscope was introduced through the mouth, and                             advanced to the third part of duodenum. The upper                            GI endoscopy was accomplished without difficulty.                            The patient tolerated the procedure well. Scope In: Scope Out: Findings:                 The Z-line was irregular, slightly nodular, and was                            found 36 cm from the incisors. Biopsies were taken                            with a cold forceps for histology. Estimated blood                            loss was minimal.                           Diffuse moderate inflammation characterized by                            erythema, friability and granularity was found in                            the gastric body. Biopsies were taken from the                            antrum, body, and fundus with a cold forceps for                            histology. Estimated blood loss was minimal.                           The examined duodenum was  normal. Biopsies were                            taken with a cold forceps for histology. Estimated                            blood loss was minimal.                           The cardia and gastric fundus were normal on                            retroflexion.                           The exam was otherwise without abnormality. Complications:            No immediate complications. Estimated blood loss:                            Minimal. Estimated Blood Loss:     Estimated blood loss was minimal. Impression:               - Z-line irregular, 37 cm from the incisors.                            Biopsied.                           - Gastritis. Biopsied.                           - Normal examined duodenum. Biopsied.                           - The examination was otherwise normal. Recommendation:           - Patient has a contact number available for                            emergencies. The signs and symptoms of potential                            delayed complications were  discussed with the                            patient. Return to normal activities tomorrow.                            Written discharge instructions were provided to the                            patient.                           - Resume previous diet.                           -  Continue present medications.                           - No aspirin, ibuprofen, naproxen, or other                            non-steroidal anti-inflammatory drugs.                           - Await pathology results.                           - Proceed with colonoscopy as previously planned. Thornton Park MD, MD 01/02/2021 1:57:15 PM This report has been signed electronically.

## 2021-01-02 NOTE — Op Note (Addendum)
[place] Patient Name: [name] Procedure Date: 01/02/2021 1:24 PM MRN: [id number] Endoscopist: Thornton Park MD, MD Age: 75 Referring MD:  Date of Birth: 1946-05-10 Gender: Female Account #: [id number] Procedure:                Colonoscopy Indications:              Chronic diarrhea                           Abdominal pain                           Nausea and vomiting                           Iron deficiency anemia without overt or occult GI                            blood loss                           Chronic use of Plavix for CVA 11/2019 and prior                            right carotid endarterectomy 5/21 Medicines:                Monitored Anesthesia Care Procedure:                Pre-Anesthesia Assessment:                           - Prior to the procedure, a History and Physical                            was performed, and patient medications and                            allergies were reviewed. The patient's tolerance of                            previous anesthesia was also reviewed. The risks                            and benefits of the procedure and the sedation                            options and risks were discussed with the patient.                            All questions were answered, and informed consent                            was obtained. Prior Anticoagulants: The patient has                            taken Plavix (clopidogrel), last dose was 5 days  prior to procedure. ASA Grade Assessment: III - A                            patient with severe systemic disease. After                            reviewing the risks and benefits, the patient was                            deemed in satisfactory condition to undergo the                            procedure.                           After obtaining informed consent, the colonoscope                            was passed under direct vision. Throughout  the                            procedure, the patient's blood pressure, pulse, and                            oxygen saturations were monitored continuously. The                            Olympus CF-HQ190L (Serial# [id number]) Colonoscope was                            introduced through the anus with the intention of                            advancing to the ileum. The scope was advanced to                            the sigmoid colon before the procedure was aborted.                            Medications were given. The colonoscopy was                            extremely difficult due to poor endoscopic                            visualization due to formed stool. The patient                            tolerated the procedure well. The quality of the                            bowel preparation was poor. The rectum was  photographed. Scope In: 1:43:46 PM Scope Out: 1:50:17 PM Total Procedure Duration: 0 hours 6 minutes 31 seconds  Findings:                 The digital rectal exam was limited given the                            amount of stool present at the anus at the start of                            the procedure.                           Extensive amounts of solid stool was found in the                            entir examined colon. Fecal impaction in the rectum                            was manually disimpacted. However, solid stool was                            present in the sigmoid colon limiting any                            meaningful evaluation.                           An area of mildly erythematous and friable (with                            contact bleeding) mucosa was found in the rectum                            after the stool ball was removed. Complications:            No immediate complications. Estimated Blood Loss:     Estimated blood loss: none. Impression:               - Preparation of the colon was poor.                            - Fecal impaction. Manual disimpaction performed.                           - Stool in the entire examined colon limiting any                            meaningful evaluation. .                           - Erythematous and friable (with contact bleeding)                            mucosa in the rectum. Likely stercoral colitis. Recommendation:           -  Patient has a contact number available for                            emergencies. The signs and symptoms of potential                            delayed complications were discussed with the                            patient. Return to normal activities tomorrow.                            Written discharge instructions were provided to the                            patient.                           - Resume previous diet.                           - Additional bowel purge recommended: Take four 5mg                             dulcolax tablets. Wait 1 hour. Then, drink 6-8                            capfuls of Miralax mixed in an adequate amount of                            water/juice/gatorade over the next 2-3 hours.                           - After the bowel purge, start Miralax 17g BID.                           - Continue present medications. Okay to resume                            Plavix today.                           - Repeat colonoscopy.                           - Follow-up in the office with Dr. Tarri Glenn or Darrell Jewel. Thornton Park MD, MD 01/02/2021 2:05:54 PM This report has been signed electronically.

## 2021-01-02 NOTE — Progress Notes (Signed)
Pt's states no medical or surgical changes since previsit or office visit.  CW - vitals 

## 2021-01-02 NOTE — Progress Notes (Signed)
Report to PACU, RN, vss, BBS= Clear.  

## 2021-01-04 ENCOUNTER — Telehealth: Payer: Self-pay

## 2021-01-04 ENCOUNTER — Ambulatory Visit: Payer: Medicare HMO | Admitting: Gastroenterology

## 2021-01-04 DIAGNOSIS — G894 Chronic pain syndrome: Secondary | ICD-10-CM | POA: Diagnosis not present

## 2021-01-04 DIAGNOSIS — Z79891 Long term (current) use of opiate analgesic: Secondary | ICD-10-CM | POA: Diagnosis not present

## 2021-01-04 NOTE — Telephone Encounter (Signed)
  Follow up Call-  Call back number 01/02/2021 09/03/2018  Post procedure Call Back phone  # [phone]  Permission to leave phone message Yes Yes  Some recent data might be hidden     Patient questions:  Do you have a fever, pain , or abdominal swelling? No. Pain Score  0 *  Have you tolerated food without any problems? Yes.    Have you been able to return to your normal activities? Yes.    Do you have any questions about your discharge instructions: Diet   No. Medications  No. Follow up visit  No.  Do you have questions or concerns about your Care? No.  Actions: * If pain score is 4 or above: No action needed, pain <4.  1. Have you developed a fever since your procedure? no  2.   Have you had an respiratory symptoms (SOB or cough) since your procedure? no  3.   Have you tested positive for COVID 19 since your procedure no  4.   Have you had any family members/close contacts diagnosed with the COVID 19 since your procedure? no   If yes to any of these questions please route to Joylene John, RN and Joella Prince, RN

## 2021-01-08 ENCOUNTER — Telehealth: Payer: Self-pay | Admitting: Gastroenterology

## 2021-01-08 NOTE — Telephone Encounter (Signed)
Pt's daughter is requesting a call back from a nurse, pt had a colonoscopy done and is still experiencing left abdominal pain.

## 2021-01-08 NOTE — Telephone Encounter (Signed)
Pts daughter states that her mother has been having pain in her Left side below her belly button. She has been taking the miralax as she was told and is passing stools, reports they are pasty. Daughter states the pain comes and goes, her mother does not have a fever. Please advise.

## 2021-01-08 NOTE — Telephone Encounter (Signed)
Left message for pt to call back  °

## 2021-01-08 NOTE — Telephone Encounter (Signed)
Patient returned your call, please call patient one more time.

## 2021-01-08 NOTE — Telephone Encounter (Signed)
We are still awaiting the results of the biopsies from her recent endoscopy. Please see the schedule has availability for an appointment with me or an  APP to evaluate her this week. Thank you.

## 2021-01-09 NOTE — Telephone Encounter (Signed)
Spoke with pts daughter and she is aware. Pt scheduled to see Tye Savoy NP tomorrow at 10am. She is aware of appt.

## 2021-01-10 ENCOUNTER — Encounter: Payer: Self-pay | Admitting: Nurse Practitioner

## 2021-01-10 ENCOUNTER — Other Ambulatory Visit: Payer: Self-pay

## 2021-01-10 ENCOUNTER — Ambulatory Visit: Payer: Medicare HMO | Admitting: Nurse Practitioner

## 2021-01-10 ENCOUNTER — Telehealth: Payer: Self-pay

## 2021-01-10 VITALS — BP 130/54 | HR 68 | Ht 60.0 in

## 2021-01-10 DIAGNOSIS — K5909 Other constipation: Secondary | ICD-10-CM | POA: Diagnosis not present

## 2021-01-10 DIAGNOSIS — D509 Iron deficiency anemia, unspecified: Secondary | ICD-10-CM

## 2021-01-10 NOTE — Progress Notes (Signed)
ASSESSMENT AND PLAN    #75 year old female with iron deficiency anemia on Plavix.  EGD earlier this month showed only gastritis, biopsies are pending.  Colonoscopy attempted but had to be aborted due to solid stool in the colon.  Erythema and friability of the rectum felt to be secondary to stercoral colitis, biopsies pending.  Patient needs repeat colonoscopy to evaluate iron deficiency anemia.  She is no longer taking any antidiarrheals and is having 3 loose stools a [name] on MiraLAX --We had a lengthy discussion about her "chronic diarrhea" as it now appears that she may have overflow diarrhea.  I explained to patient and son what overflow diarrhea is and why it is important to not use antidiarrheals in this case.  She will continue daily MiraLAX --Patient will be rescheduled for colonoscopy.  The risks and benefits of colonoscopy with possible polypectomy / biopsies were discussed and the patient agrees to proceed.  --After colonoscopy she may need to start iron supplement  # History of CVA on chronic Plavix.  -Hold Plavix for 5 days before procedure - will instruct when and how to resume after procedure. Patient understands that there is a low but real risk of cardiovascular event such as heart attack, stroke, or embolism /  thrombosis, or ischemia while off Plavix. The patient consents to proceed. Will communicate by phone or EMR with patient's prescribing provider to confirm that holding Plavix is reasonable in this case.    HISTORY OF PRESENT ILLNESS     Primary Gastroenterologist : Thornton Park, MD  Chief Complaint : follow up on IDA and constipation.   [name] is a 75 y.o. female with PMH / [place] significant for DM2, hypertension, hyperlipidemia , CVA, carotid stenosis, ureteral stones, obesity, hypothyroidism  Patient was seen here earlier this month for evaluation of abdominal pain, nausea and vomiting, chronic diarrhea and iron deficiency anemia.  She was scheduled  for EGD and colonoscopy after holding Plavix for 5 days.  EGD showed gastritis.  Colonoscopy could not be completed due to solid stool in the colon, exam was only to the sigmoid colon.  There was some friability with contact bleeding in the rectum felt to be stercoral colitis.  Biopsies are pending. Patient was advised to follow-up in the office to arrange for repeat colonoscopy.    Interval History:  She has been taking Miralax as prescribed. Initially was taking BID after colonoscopy but never did pass solid stool. Now taking miralax once daily. She is having having loose stool at least three times a [name]. She complains of bowel and urinary incontinence.  She is not taking any antidiarrheals   Previous Endoscopic Evaluations / Pertinent Studies:  01/02/2021 EGD for abdominal pain, nausea / vomiting / IDA - irregular Z-line, gastritis, exam otherwise normal   01/02/21 colonoscopy ( aborted) for IDA -Preparation of the colon was poor. - Fecal impaction. Manual disimpaction performed. - Stool in the entire examined colon limiting any meaningful evaluation. . - Erythematous and friable (with contact bleeding) mucosa in the rectum. Likely stercoral colitis.  *Path is pending  Past Medical History:  Diagnosis Date  . Arthritis   . Basal ganglia stroke ([place])   . Bilateral lower extremity edema   . Cancer ([place])   . Carotid artery occlusion   . Flank pain   . Full dentures   . History of cervical cancer    11/ 1994  Stage IB  s/p  high dose radiation brachytherapy @ [facility] 01/  1995--- per pt no recurrence  . History of chronic bronchitis   . History of hyperthyroidism    d 03/ 2011---due to grave's disease--- s/p  RAI treatment 04/ 2011  . History of kidney stones   . History of sepsis 05/03/2018   due to UTI with klebsiella/ pyelonephritis/ ureteral obstruction cause by stone  . Hyperlipidemia   . Hypothyroidism, postradioiodine therapy    endocrinologist--  dr Loanne Drilling--  dx graves disease  and s/p RAI i131 treatement 4/ 2011  . Nauseated   . Oral thrush 05/03/2018  . Pneumonia   . Type 2 diabetes mellitus treated with insulin ([place])    FOLLOWED BY PCP  . Urgency of urination   . Wears glasses     Current Medications, Allergies, Past Surgical History, Family History and Social History were reviewed in [employer] record.   Current Outpatient Medications  Medication Sig Dispense Refill  . aspirin EC 81 MG EC tablet Take 1 tablet (81 mg total) by mouth daily. 30 tablet 1  . atorvastatin (LIPITOR) 40 MG tablet Take 1 tablet (40 mg total) by mouth daily at 6 PM. (Patient taking differently: Take 40 mg by mouth at bedtime.) 30 tablet 1  . baclofen (LIORESAL) 10 MG tablet Take 10 mg by mouth 4 (four) times daily as needed.    . bismuth subsalicylate (PEPTO BISMOL) 262 MG/15ML suspension Please take 2 teaspoons by mouth TID (Patient taking differently: Please take 2 teaspoons by mouth TID as needed) 360 mL 0  . cephALEXin (KEFLEX) 250 MG capsule Take 250 mg by mouth daily.     . clopidogrel (PLAVIX) 75 MG tablet Take 1 tablet (75 mg total) by mouth daily. 21 tablet 0  . ergocalciferol (VITAMIN D2) 50000 UNITS capsule Take 50,000 Units by mouth every Sunday.     Marland Kitchen gemfibrozil (LOPID) 600 MG tablet Take 600 mg by mouth 2 (two) times daily.    . insulin degludec (TRESIBA FLEXTOUCH) 100 UNIT/ML FlexTouch Pen Inject 30 Units into the skin at bedtime. 3 mL 0  . levothyroxine (SYNTHROID) 100 MCG tablet Take 1 tablet (100 mcg total) by mouth daily before breakfast. 30 tablet 1  . metFORMIN (GLUCOPHAGE) 500 MG tablet Take 500 mg by mouth 2 (two) times daily with a meal.    . oxyCODONE ER (XTAMPZA ER) 9 MG C12A Take 9 mg by mouth every 12 (twelve) hours.    . triamcinolone cream (KENALOG) 0.1 % Apply 1 application topically daily as needed (vaginal itching). Mix with nystatin cream     No current facility-administered medications for this visit.    Review of  Systems: No chest pain. No shortness of breath. No urinary complaints.   PHYSICAL EXAM :    Wt Readings from Last 3 Encounters:  01/02/21 156 lb (70.8 kg)  12/19/20 156 lb (70.8 kg)  12/12/20 157 lb 12.8 oz (71.6 kg)    BP (!) 130/54   Pulse 68   Ht 5' (1.524 m)   BMI 30.47 kg/m  Constitutional:  Pleasant female in no acute distress. Psychiatric: Normal mood and affect. Behavior is normal. EENT: Pupils normal.  Conjunctivae are normal. No scleral icterus. Neck supple.  Cardiovascular: Normal rate, regular rhythm. No edema Pulmonary/chest: Effort normal and breath sounds normal. No wheezing, rales or rhonchi. Abdominal: Soft, nondistended, nontender. Bowel sounds active throughout. There are no masses palpable. No hepatomegaly. Neurological: Alert and oriented to person place and time. Skin: Skin is warm and dry. No rashes noted.  Tye Savoy, NP  01/10/2021, 10:09 AM

## 2021-01-10 NOTE — Patient Instructions (Signed)
If you are age 75 or older, your body mass index should be between 23-30. Your Body mass index is 30.47 kg/m. If this is out of the aforementioned range listed, please consider follow up with your Primary Care Provider.  If you are age 35 or younger, your body mass index should be between 19-25. Your Body mass index is 30.47 kg/m. If this is out of the aformentioned range listed, please consider follow up with your Primary Care Provider.   You will be contacted by our office prior to your procedure for directions on holding your Plavix.  If you do not hear from our office 1 week prior to your scheduled procedure, please call [phone] to discuss.   DO NOT take any diarrhea medications.  Continue taking Miralax daily.  Follow up pending at this time.  Thank you for entrusting me with your care and choosing [facility].  Roanna Raider, NP-C

## 2021-01-10 NOTE — Telephone Encounter (Signed)
[facility] Pre-operative Risk Assessment     Request for surgical clearance:     Endoscopy Procedure  What type of surgery is being performed?     Colonoscopy  When is this surgery scheduled?     03/06/2021  What type of clearance is required ?   Pharmacy  Are there any medications that need to be held prior to surgery and how long? Plavix starting 5 days prior  Practice name and name of physician performing surgery?      [facility]  What is your office phone and fax number?      Phone- [phone]  Fax575 [phone]  Anesthesia type (None, local, MAC, general) ?       MAC

## 2021-01-11 ENCOUNTER — Other Ambulatory Visit: Payer: Self-pay

## 2021-01-11 MED ORDER — PANTOPRAZOLE SODIUM 40 MG PO TBEC: 40.0000 mg | DELAYED_RELEASE_TABLET | Freq: Every day | ORAL | 3 refills | Status: DC

## 2021-01-11 NOTE — Progress Notes (Signed)
Reviewed and agree with management plans. ? ?Kimberly L. Beavers, MD, MPH  ?

## 2021-01-14 ENCOUNTER — Other Ambulatory Visit: Payer: Self-pay

## 2021-01-14 ENCOUNTER — Encounter (HOSPITAL_COMMUNITY): Payer: Medicare HMO

## 2021-01-14 ENCOUNTER — Ambulatory Visit: Payer: Medicare HMO | Admitting: Physician Assistant

## 2021-01-14 ENCOUNTER — Ambulatory Visit: Payer: Medicare HMO

## 2021-01-14 ENCOUNTER — Ambulatory Visit (HOSPITAL_COMMUNITY)
Admission: RE | Admit: 2021-01-14 | Discharge: 2021-01-14 | Disposition: A | Discharge status: Home / Self Care | Payer: Medicare HMO | Source: Ambulatory Visit | Attending: Surgery | Admitting: Surgery

## 2021-01-14 VITALS — BP 147/57 | HR 74 | Temp 98.7°F | Resp 20 | Ht 60.0 in | Wt 148.6 lb

## 2021-01-14 DIAGNOSIS — I6523 Occlusion and stenosis of bilateral carotid arteries: Secondary | ICD-10-CM

## 2021-01-14 DIAGNOSIS — I6521 Occlusion and stenosis of right carotid artery: Secondary | ICD-10-CM

## 2021-01-14 NOTE — Progress Notes (Signed)
Office Note     CC:  follow up Requesting Provider:  Everardo Beals, NP  HPI: [name] is a 75 y.o. (08/25/1946) female who presents for routine follow-up of carotid artery disease.  The patient underwent right carotid endarterectomy on Mar 28, 2020 by Dr. Donnetta Hutching.  She found to have a critical right carotid stenosis on work-up of a neurologic change.  This was felt to be asymptomatic.  She did have a right brain stroke but not in the carotid distribution.  She states her symptoms resulted in right-sided weakness.  She ambulates with a cane.  She has had bilateral hip arthroplasty and required revision on the right.    She denies monocular blindness, slurred speech, facial drooping, upper extremity numbness. She was last seen on April 17, 2020 and was doing well.  She is compliant with aspirin, statin and Plavix.  Her Plavix and aspirin are currently being held for preprocedure colonoscopy. She is diabetic on oral agents. Quit tobacco use in 1983.   Past Medical History:  Diagnosis Date  . Arthritis   . Basal ganglia stroke ([place])   . Bilateral lower extremity edema   . Cancer ([place])   . Carotid artery occlusion   . Flank pain   . Full dentures   . History of cervical cancer    11/ 1994  Stage IB  s/p  high dose radiation brachytherapy @ [facility] 01/ 1995--- per pt no recurrence  . History of chronic bronchitis   . History of hyperthyroidism    d 03/ 2011---due to grave's disease--- s/p  RAI treatment 04/ 2011  . History of kidney stones   . History of sepsis 05/03/2018   due to UTI with klebsiella/ pyelonephritis/ ureteral obstruction cause by stone  . Hyperlipidemia   . Hypothyroidism, postradioiodine therapy    endocrinologist--  dr Loanne Drilling--  dx graves disease and s/p RAI i131 treatement 4/ 2011  . Nauseated   . Oral thrush 05/03/2018  . Pneumonia   . Type 2 diabetes mellitus treated with insulin ([place])    FOLLOWED BY PCP  . Urgency of urination   . Wears glasses      Past Surgical History:  Procedure Laterality Date  . APPENDECTOMY    . CAROTID ENDARTERECTOMY Right 03/28/2020  . CATARACT EXTRACTION W/ INTRAOCULAR LENS  IMPLANT, BILATERAL  2004  approx.  . CYSTOSCOPY WITH STENT PLACEMENT Right 05/04/2018   Procedure: CYSTOSCOPY WITH STENT PLACEMENT AND RETROGRADE PYELOGRAM;  Surgeon: Ceasar Mons, MD;  Location: [facility];  Service: Urology;  Laterality: Right;  . CYSTOSCOPY/URETEROSCOPY/HOLMIUM LASER/STENT PLACEMENT Right 05/19/2018   Procedure: CYSTOSCOPY, URETEROSCOPY/HOLMIUM LASER, STONE BASKETRY/ STENT EXCHANGE;  Surgeon: Ceasar Mons, MD;  Location: [facility];  Service: Urology;  Laterality: Right;  . ENDARTERECTOMY Right 03/28/2020   Procedure: RIGHT CAROTID ENDARTERECTOMY with PATCH ANGIOPLASTY;  Surgeon: Rosetta Posner, MD;  Location: [place];  Service: Vascular;  Laterality: Right;  . EXCISION MASS LEFT CHEST WALL  09-20-2007   dr Grandville Silos  [facility]  . Radioactive Iodine Therapy     for thyroid  . REVISION TOTAL HIP ARTHROPLASTY Left early 2000s  . TANDEM RING INSERTION  1995   dr Aldean Ast @ [facility]   EUA w/ tandem placement in ovid  (for direct high dose radiation brachytherapy , cervical cancer)  . TOTAL HIP ARTHROPLASTY Left 1990s  . TRANSTHORACIC ECHOCARDIOGRAM  08/07/2017   mild focal basal hypertrophy of the septum,  ef 56-38%, grade 1 diastolic dysfunction/  atrial septum with lipomatous hypertrophy/  trivial PR    Social History   Socioeconomic History  . Marital status: Married    Spouse name: Not on file  . Number of children: 2  . Years of education: Not on file  . Highest education level: Not on file  Occupational History  . Occupation: Disabled  Tobacco Use  . Smoking status: Former Smoker    Years: 5.00    Types: Cigarettes    Quit date: 05/13/1982    Years since quitting: 38.7  . Smokeless tobacco: Never Used  Vaping Use  . Vaping Use: Never used  Substance and Sexual Activity   . Alcohol use: No  . Drug use: No  . Sexual activity: Not Currently  Other Topics Concern  . Not on file  Social History Narrative   Married and lives with husband   Has 2 children    Housewife   Disabled   2-3 cups of caffeine    R handed    Social Determinants of Health   Financial Resource Strain: Not on file  Food Insecurity: Not on file  Transportation Needs: Not on file  Physical Activity: Not on file  Stress: Not on file  Social Connections: Not on file  Intimate Partner Violence: Not on file   Family History  Problem Relation Age of Onset  . Emphysema Father   . Diabetes Father   . Emphysema Sister   . Emphysema Brother   . Cancer Mother        Skin Cancer  . Diabetes Mother   . Colon cancer Neg Hx   . Esophageal cancer Neg Hx   . Rectal cancer Neg Hx   . Stomach cancer Neg Hx     Current Outpatient Medications  Medication Sig Dispense Refill  . aspirin EC 81 MG EC tablet Take 1 tablet (81 mg total) by mouth daily. 30 tablet 1  . atorvastatin (LIPITOR) 40 MG tablet Take 1 tablet (40 mg total) by mouth daily at 6 PM. (Patient taking differently: Take 40 mg by mouth at bedtime.) 30 tablet 1  . baclofen (LIORESAL) 10 MG tablet Take 10 mg by mouth 4 (four) times daily as needed.    . bismuth subsalicylate (PEPTO BISMOL) 262 MG/15ML suspension Please take 2 teaspoons by mouth TID (Patient taking differently: Please take 2 teaspoons by mouth TID as needed) 360 mL 0  . cephALEXin (KEFLEX) 250 MG capsule Take 250 mg by mouth daily.     . clopidogrel (PLAVIX) 75 MG tablet Take 1 tablet (75 mg total) by mouth daily. 21 tablet 0  . ergocalciferol (VITAMIN D2) 50000 UNITS capsule Take 50,000 Units by mouth every Sunday.     Marland Kitchen gemfibrozil (LOPID) 600 MG tablet Take 600 mg by mouth 2 (two) times daily.    . insulin degludec (TRESIBA FLEXTOUCH) 100 UNIT/ML FlexTouch Pen Inject 30 Units into the skin at bedtime. 3 mL 0  . levothyroxine (SYNTHROID) 100 MCG tablet Take 1  tablet (100 mcg total) by mouth daily before breakfast. 30 tablet 1  . metFORMIN (GLUCOPHAGE) 500 MG tablet Take 500 mg by mouth 2 (two) times daily with a meal.    . oxyCODONE ER (XTAMPZA ER) 9 MG C12A Take 9 mg by mouth every 12 (twelve) hours.    . pantoprazole (PROTONIX) 40 MG tablet Take 1 tablet (40 mg total) by mouth daily. 30 tablet 3  . triamcinolone cream (KENALOG) 0.1 % Apply 1 application topically daily as needed (  vaginal itching). Mix with nystatin cream     No current facility-administered medications for this visit.    Allergies  Allergen Reactions  . Codeine Nausea Only     REVIEW OF SYSTEMS:   [X]  denotes positive finding, [ ]  denotes negative finding Cardiac  Comments:  Chest pain or chest pressure:    Shortness of breath upon exertion:    Short of breath when lying flat:    Irregular heart rhythm:        Vascular    Pain in calf, thigh, or hip brought on by ambulation:    Pain in feet at night that wakes you up from your sleep:     Blood clot in your veins:    Leg swelling:         Pulmonary    Oxygen at home:    Productive cough:     Wheezing:         Neurologic    Sudden weakness in arms or legs:     Sudden numbness in arms or legs:     Sudden onset of difficulty speaking or slurred speech:    Temporary loss of vision in one eye:     Problems with dizziness:         Gastrointestinal    Blood in stool:     Vomited blood:         Genitourinary    Burning when urinating:     Blood in urine:        Psychiatric    Major depression:         Hematologic    Bleeding problems:    Problems with blood clotting too easily:        Skin    Rashes or ulcers:        Constitutional    Fever or chills:      PHYSICAL EXAMINATION:  Vitals:   01/14/21 1155 01/14/21 1159  BP: (!) 143/54 (!) 147/57  Pulse: 74   Resp: 20   Temp: 98.7 F (37.1 C)   SpO2: 98%     General:  WDWN in NAD; vital signs documented above Gait: With cane, no  ataxia HENT: WNL, normocephalic Pulmonary: normal non-labored breathing , without Rales, rhonchi,  wheezing Cardiac: regular HR, without  Murmurs with left carotid bruit Skin: without rashes Vascular Exam/Pulses: 2+ bilateral brachial, radial and dorsalis pedis pulses Extremities: without ischemic changes, without Gangrene , without cellulitis; without open wounds;  Musculoskeletal: no muscle wasting or atrophy  Neurologic: A&O X 3;  No focal weakness or paresthesias are detected Psychiatric:  The pt has Normal affect.   Non-Invasive Vascular Imaging:   01/14/2021 Right Carotid: Patent right carotid endarterectomy. Velocities in the  right ICA are consistent with a 1-39% stenosis. Elevated peak  systolic velocity at area of narrowing at bifurcation.   Left Carotid: Velocities in the left ICA are consistent with a 40-59%  stenosis.   Vertebrals: Bilateral vertebral arteries demonstrate antegrade flow.   Subclavian arteries have multiphasic flow bilaterally.  ASSESSMENT/PLAN:: 75 y.o. female here for follow up for carotid artery stenosis.  She is status post right carotid endarterectomy.  Her duplex today reveals velocities in the right ICA consistent with a 1 to 39% stenosis.  Velocities in left ICA are consistent with a 40 to 59% stenosis and this is unchanged from her preoperative carotid duplex.  She has no new neurologic symptoms.  She is compliant with medication and does not use tobacco products.  We reviewed signs and symptoms of stroke/TIA and I encouraged her to call EMS should these occur. Follow-up in 1 year with carotid duplex    Barbie Banner, PA-C [facility] [phone]  Clinic MD:   Trula Slade

## 2021-01-15 ENCOUNTER — Ambulatory Visit (INDEPENDENT_AMBULATORY_CARE_PROVIDER_SITE_OTHER): Payer: Medicare HMO | Admitting: Gastroenterology

## 2021-01-15 ENCOUNTER — Encounter: Payer: Self-pay | Admitting: Gastroenterology

## 2021-01-15 VITALS — BP 140/60 | HR 77 | Ht 60.0 in | Wt 145.0 lb

## 2021-01-15 DIAGNOSIS — R1113 Vomiting of fecal matter: Secondary | ICD-10-CM | POA: Diagnosis not present

## 2021-01-15 DIAGNOSIS — R1084 Generalized abdominal pain: Secondary | ICD-10-CM

## 2021-01-15 DIAGNOSIS — K5641 Fecal impaction: Secondary | ICD-10-CM | POA: Diagnosis not present

## 2021-01-15 NOTE — Progress Notes (Signed)
Following message sent to Rhys Martini and April Pait:  [place]  [facility] Phone: [phone] Fax: [phone]   Patient Name: [name] DOB: [birth year] MRN #: [id number]  Imaging Ordered: Abd Xray 2 view  Diagnosis: Abdominal pain and N/V with fecal material  Ordering Provider: Dr. Tarri Glenn  Is a Prior Authorization needed? We are in the process of obtaining it now  Is the patient Diabetic? Yes  Does the patient have Hypertension? Yes  Does the patient have any implanted devices or hardware? No  Date of last BUN/Creat, if needed? N/A  Patient Weight? 145#  Is the patient able to get on the table? Yes  Has the patient been diagnosed with COVID? No  Is the patient waiting on COVID testing results? No  Thank you for your assistance! [facility]

## 2021-01-15 NOTE — Patient Instructions (Addendum)
It was a pleasure to see you today. Based on our discussion, I am providing you with my recommendations below:  RECOMMENDATION(S):    Increase PeptoBismal to 2 tablets three times per day  Continue Miralax 1 capful daily. 7 DAYS BEFORE YOUR COLONOSCOPY, YOU WILL TAKE 1 CAPFUL 2 TIMES DAILY  IMAGING:  . You will be contacted by [facility] (Your caller ID will indicate phone # [phone]) within the next business 2 days to schedule your ABDOMINAL XRAY. If you have not heard from them within 2 business days, please call [facility] at [phone] to follow up on the status of your appointment.    COLONOSCOPY:   . You have been scheduled for a colonoscopy. Please follow written instructions given to you at your visit today.   PREP:   . Please pick up your prep supplies at the pharmacy within the next 1-3 days.  INHALERS:   . If you use inhalers (even only as needed), please bring them with you on the day of your procedure.  MEDICATIONS TO HOLD:  . Please refer to the written instructions provided to you today  COLONOSCOPY TIPS:  . To reduce nausea and dehydration, stay well hydrated for 3-4 days prior to the exam.  . To prevent skin/hemorrhoid irritation - prior to wiping, put A&Dointment or vaseline on the toilet paper. Marland Kitchen Keep a towel or pad on the bed.  Marland Kitchen BEFORE STARTING YOUR PREP, drink  64oz of clear liquids in the morning. This will help to flush the colon and will ensure you are well hydrated!!!!  NOTE - This is in addition to the fluids required for to complete your prep. . Use of a flavored hard candy, such as grape Anise Salvo, can counteract some of the flavor of the prep and may prevent some nausea.   BMI:  . If you are age 42 or older, your body mass index should be between 23-30. Your There is no height or weight on file to calculate BMI. If this is out of the aforementioned range listed, please consider follow up with  your Primary Care Provider.  Thank you for trusting me with your gastrointestinal care!    Thornton Park, MD, MPH

## 2021-01-15 NOTE — Progress Notes (Signed)
Referring Provider: Everardo Beals, NP Primary Care Physician:  Everardo Beals, NP  Reason for Consultation:  Abdominal pain, diarrhea   IMPRESSION:  Chronic diarrhea, diffuse abdominal pain, and nausea Fecal impaction limiting colonoscopy 12/2020 Moderate stool noted on prior abdominal imaging studies Reflux esophagitis and gastritis on EGD 12/2020 Iron deficiency anemia without overt or occult GI blood loss Chronic use of Plavix for CVA 11/2019 and prior right carotid endarterectomy 5/21  Symptoms concerning for recurrent fecal impaction.  Abdominal films now. Continue bowel regimen, low threshold to repeat purge. Consider trial of PeptoBismal for additional symptomatic relief.   Reflux and gastritis without H pylori: Continue pantoprazole.   Colonoscopy recommended as no source of symptoms or iron deficiency anemia identified on CT scan in 2020 or 2021. Repeat labs to monitor persistent of anemia.   PLAN: - Abdominal x-ray  - Iron, ferritin, transferrin saturation, CBC - Increase PeptoBismal 2 po TID - Continue pantoprazole 40 mg daily - Continue Miralax 17 grams daily, increase to twice daily for the week prior to the colonoscopy - Low threshold to repeat bowel purge - Moving up the colonoscopy to next available/ASAP using a 2 day bowel prep, after 5 day Plavix washout  I spent 30 minutes, including in depth chart review, independent review of results, communicating results with the patient directly, face-to-face time with the patient, coordinating care, and ordering studies and medications as appropriate, and documentation.  HPI: SOLYANA NONAKA is a 75 y.o. female who returns in follow-up with ongoing epigastric pain and diarrhea. Ms. Zellars has a history of type 2 diabetes mellitus hypertension, CVA, carotid stenosis, ureteral stones, and hyperlipidemia.  Recent decline in cognitive function contributes to limitations in history taking. However, her daughter  participates by telephone.   Originally seen in 2019. Failed attempt at colonoscopy due to poor prep and retained stool at the time of colonoscopy 09/03/18.  Under evaluation over the last couple of months for symptoms similar to those under investigation in 2019 including near daily epigastric pain and left lower abdominal pain with nausea, chronic diarrhea, and unexplained iron deficiency requiring Feraheme 11/21. There is no vomiting. No identified triggers. Continues to have multiple, liquid, watery bowel movements daily. Wearing a pad due to stool leak. Noting dark brown stools while using PeptoBismal.  EGD and attempted colonoscopy 01/02/21.  EGD showed irregular z-line, reflux and gastritis. Duodenal biopsies were normal.  Colonoscopy could not be completed due to solid stool in the colon, exam was only to the sigmoid colon.  There was some friability with contact bleeding in the rectum felt to be stercoral colitis.  Colonoscopy could not be performed due to large stool burden with fecal impaction. Disimpaction performed. Patient asked to reschedule the colonoscopy with a 2 day prep. Instructed to use Miralax 17g BID.  Reduced the Miralax to QD after taking it twice daily after the endoscopy.  Her symptoms are unchanged.   Data reviewed today: - 10/01/20: iron 33, ferritin 16, percent saturation 9, hemoglobin 10.9, MCV 83.8, RDW 14.5, platelets 228, albumin 2.9, normal liver enzymes - CT abd/pelvis with contrast 07/26/19: moderate stool, ascending colon lipoma, advanced atherosclerotic disease, no intra-abdominal or pelvic pathology, splenomegaly - CT abd/pelvis with contrast 12/12/19: fluid filled colon, stable hematometra, mild fullness renal collecting system      Past Medical History:  Diagnosis Date  . Arthritis   . Basal ganglia stroke ([place])   . Bilateral lower extremity edema   . Cancer ([place])   . Carotid artery  occlusion   . Flank pain   . Full dentures   . History of cervical  cancer    11/ 1994  Stage IB  s/p  high dose radiation brachytherapy @ [facility] 01/ 1995--- per pt no recurrence  . History of chronic bronchitis   . History of hyperthyroidism    d 03/ 2011---due to grave's disease--- s/p  RAI treatment 04/ 2011  . History of kidney stones   . History of sepsis 05/03/2018   due to UTI with klebsiella/ pyelonephritis/ ureteral obstruction cause by stone  . Hyperlipidemia   . Hypothyroidism, postradioiodine therapy    endocrinologist--  dr Loanne Drilling--  dx graves disease and s/p RAI i131 treatement 4/ 2011  . Nauseated   . Oral thrush 05/03/2018  . Pneumonia   . Type 2 diabetes mellitus treated with insulin ([place])    FOLLOWED BY PCP  . Urgency of urination   . Wears glasses     Past Surgical History:  Procedure Laterality Date  . APPENDECTOMY    . CAROTID ENDARTERECTOMY Right 03/28/2020  . CATARACT EXTRACTION W/ INTRAOCULAR LENS  IMPLANT, BILATERAL  2004  approx.  . CYSTOSCOPY WITH STENT PLACEMENT Right 05/04/2018   Procedure: CYSTOSCOPY WITH STENT PLACEMENT AND RETROGRADE PYELOGRAM;  Surgeon: Ceasar Mons, MD;  Location: [facility];  Service: Urology;  Laterality: Right;  . CYSTOSCOPY/URETEROSCOPY/HOLMIUM LASER/STENT PLACEMENT Right 05/19/2018   Procedure: CYSTOSCOPY, URETEROSCOPY/HOLMIUM LASER, STONE BASKETRY/ STENT EXCHANGE;  Surgeon: Ceasar Mons, MD;  Location: [facility];  Service: Urology;  Laterality: Right;  . ENDARTERECTOMY Right 03/28/2020   Procedure: RIGHT CAROTID ENDARTERECTOMY with PATCH ANGIOPLASTY;  Surgeon: Rosetta Posner, MD;  Location: [place];  Service: Vascular;  Laterality: Right;  . EXCISION MASS LEFT CHEST WALL  09-20-2007   dr Grandville Silos  [facility]  . Radioactive Iodine Therapy     for thyroid  . REVISION TOTAL HIP ARTHROPLASTY Left early 2000s  . TANDEM RING INSERTION  1995   dr Aldean Ast @ [facility]   EUA w/ tandem placement in ovid  (for direct high dose radiation brachytherapy , cervical cancer)  .  TOTAL HIP ARTHROPLASTY Left 1990s  . TRANSTHORACIC ECHOCARDIOGRAM  08/07/2017   mild focal basal hypertrophy of the septum,  ef 00-93%, grade 1 diastolic dysfunction/  atrial septum with lipomatous hypertrophy/  trivial PR    Current Outpatient Medications  Medication Sig Dispense Refill  . aspirin EC 81 MG EC tablet Take 1 tablet (81 mg total) by mouth daily. 30 tablet 1  . atorvastatin (LIPITOR) 40 MG tablet Take 1 tablet (40 mg total) by mouth daily at 6 PM. (Patient taking differently: Take 40 mg by mouth at bedtime.) 30 tablet 1  . baclofen (LIORESAL) 10 MG tablet Take 10 mg by mouth 4 (four) times daily as needed.    . bismuth subsalicylate (PEPTO BISMOL) 262 MG/15ML suspension Please take 2 teaspoons by mouth TID (Patient taking differently: Please take 2 teaspoons by mouth TID as needed) 360 mL 0  . cephALEXin (KEFLEX) 250 MG capsule Take 250 mg by mouth daily.     . clopidogrel (PLAVIX) 75 MG tablet Take 1 tablet (75 mg total) by mouth daily. 21 tablet 0  . ergocalciferol (VITAMIN D2) 50000 UNITS capsule Take 50,000 Units by mouth every Sunday.     Marland Kitchen gemfibrozil (LOPID) 600 MG tablet Take 600 mg by mouth 2 (two) times daily.    . insulin degludec (TRESIBA FLEXTOUCH) 100 UNIT/ML FlexTouch Pen Inject 30 Units into  the skin at bedtime. 3 mL 0  . levothyroxine (SYNTHROID) 100 MCG tablet Take 1 tablet (100 mcg total) by mouth daily before breakfast. 30 tablet 1  . metFORMIN (GLUCOPHAGE) 500 MG tablet Take 500 mg by mouth 2 (two) times daily with a meal.    . ondansetron (ZOFRAN-ODT) 8 MG disintegrating tablet Take 8 mg by mouth 3 (three) times daily.    Marland Kitchen oxybutynin (DITROPAN-XL) 10 MG 24 hr tablet Take 10 mg by mouth daily.    Marland Kitchen oxyCODONE ER (XTAMPZA ER) 9 MG C12A Take 9 mg by mouth every 12 (twelve) hours.    . triamcinolone cream (KENALOG) 0.1 % Apply 1 application topically daily as needed (vaginal itching). Mix with nystatin cream    . pantoprazole (PROTONIX) 40 MG tablet Take 1 tablet  (40 mg total) by mouth daily. 30 tablet 3   No current facility-administered medications for this visit.    Allergies as of 01/15/2021 - Review Complete 01/15/2021  Allergen Reaction Noted  . Codeine Nausea Only 10/19/2008    Family History  Problem Relation Age of Onset  . Emphysema Father   . Diabetes Father   . Emphysema Sister   . Emphysema Brother   . Cancer Mother        Skin Cancer  . Diabetes Mother   . Colon cancer Neg Hx   . Esophageal cancer Neg Hx   . Rectal cancer Neg Hx   . Stomach cancer Neg Hx      Physical Exam: General:   Alert,  well-nourished, pleasant and cooperative in NAD Head:  Normocephalic and atraumatic. Eyes:  Sclera clear, no icterus.   Conjunctiva pink. Abdomen:  Soft, nontender, nondistended, normal bowel sounds, no rebound or guarding. No hepatosplenomegaly.   Rectal:  Deferred  Msk:  Symmetrical. No boney deformities LAD: No inguinal or umbilical LAD Extremities:  No clubbing or edema. Neurologic:  Alert and  oriented x4;  grossly nonfocal Skin:  Intact without significant lesions or rashes. Psych:  Alert and cooperative. Normal mood and affect.   Dax Murguia L. Tarri Glenn, MD, MPH 01/15/2021, 2:00 PM

## 2021-01-16 DIAGNOSIS — M5440 Lumbago with sciatica, unspecified side: Secondary | ICD-10-CM | POA: Diagnosis not present

## 2021-01-16 DIAGNOSIS — M5417 Radiculopathy, lumbosacral region: Secondary | ICD-10-CM | POA: Diagnosis not present

## 2021-01-16 DIAGNOSIS — M791 Myalgia, unspecified site: Secondary | ICD-10-CM | POA: Diagnosis not present

## 2021-01-16 DIAGNOSIS — G894 Chronic pain syndrome: Secondary | ICD-10-CM | POA: Diagnosis not present

## 2021-01-16 DIAGNOSIS — E089 Diabetes mellitus due to underlying condition without complications: Secondary | ICD-10-CM | POA: Diagnosis not present

## 2021-01-16 DIAGNOSIS — R69 Illness, unspecified: Secondary | ICD-10-CM | POA: Diagnosis not present

## 2021-01-16 NOTE — Telephone Encounter (Signed)
Patient has been advised that per Dr. Donnetta Hutching she can hold her Plavix/Clopidagrel starting 5 days prior to her Colonoscopy (April 15). I advised that she was be told when to restart.

## 2021-01-17 ENCOUNTER — Telehealth: Payer: Self-pay | Admitting: Internal Medicine

## 2021-01-17 NOTE — Telephone Encounter (Signed)
GI ON CALL NOTE  Chart reviewed, including aborted colonoscopy 01-02-21 and recent office visits 01-10-21 and 01-15-21. Daughter reports ongoing problems with constipation and abdominal pain. Repeat colonoscopy planned for next week as well as interval abdominal films. She calls tonight reporting a fever of 101, after the patient complained of chills. No vomiting or complaints, other than abdominal pain. I recommended that she take her mother to the ER for evaluation ASAP. In addition to a general evaluation and workup, she will likely need an abdominal CT. She understood, agreed and was grateful. I will forward this information to Dr. Tarri Glenn.

## 2021-01-18 ENCOUNTER — Emergency Department (HOSPITAL_COMMUNITY): Payer: Medicare HMO

## 2021-01-18 ENCOUNTER — Inpatient Hospital Stay (HOSPITAL_COMMUNITY)
Admission: EM | Admit: 2021-01-18 | Discharge: 2021-01-23 | DRG: 374 | Disposition: A | Discharge status: Home / Self Care | Payer: Medicare HMO | Attending: Internal Medicine | Admitting: Internal Medicine

## 2021-01-18 ENCOUNTER — Other Ambulatory Visit: Payer: Self-pay

## 2021-01-18 ENCOUNTER — Telehealth: Payer: Self-pay

## 2021-01-18 ENCOUNTER — Encounter (HOSPITAL_COMMUNITY): Payer: Self-pay | Admitting: Emergency Medicine

## 2021-01-18 DIAGNOSIS — R933 Abnormal findings on diagnostic imaging of other parts of digestive tract: Secondary | ICD-10-CM

## 2021-01-18 DIAGNOSIS — Z833 Family history of diabetes mellitus: Secondary | ICD-10-CM

## 2021-01-18 DIAGNOSIS — N390 Urinary tract infection, site not specified: Secondary | ICD-10-CM | POA: Diagnosis present

## 2021-01-18 DIAGNOSIS — R112 Nausea with vomiting, unspecified: Secondary | ICD-10-CM

## 2021-01-18 DIAGNOSIS — Z8673 Personal history of transient ischemic attack (TIA), and cerebral infarction without residual deficits: Secondary | ICD-10-CM | POA: Diagnosis not present

## 2021-01-18 DIAGNOSIS — Z794 Long term (current) use of insulin: Secondary | ICD-10-CM

## 2021-01-18 DIAGNOSIS — R1110 Vomiting, unspecified: Secondary | ICD-10-CM

## 2021-01-18 DIAGNOSIS — E876 Hypokalemia: Secondary | ICD-10-CM | POA: Diagnosis present

## 2021-01-18 DIAGNOSIS — N179 Acute kidney failure, unspecified: Secondary | ICD-10-CM | POA: Diagnosis not present

## 2021-01-18 DIAGNOSIS — D500 Iron deficiency anemia secondary to blood loss (chronic): Secondary | ICD-10-CM

## 2021-01-18 DIAGNOSIS — I10 Essential (primary) hypertension: Secondary | ICD-10-CM | POA: Diagnosis present

## 2021-01-18 DIAGNOSIS — E1140 Type 2 diabetes mellitus with diabetic neuropathy, unspecified: Secondary | ICD-10-CM | POA: Diagnosis present

## 2021-01-18 DIAGNOSIS — N3000 Acute cystitis without hematuria: Secondary | ICD-10-CM | POA: Diagnosis not present

## 2021-01-18 DIAGNOSIS — Z885 Allergy status to narcotic agent status: Secondary | ICD-10-CM

## 2021-01-18 DIAGNOSIS — D490 Neoplasm of unspecified behavior of digestive system: Secondary | ICD-10-CM | POA: Diagnosis not present

## 2021-01-18 DIAGNOSIS — R197 Diarrhea, unspecified: Secondary | ICD-10-CM | POA: Diagnosis not present

## 2021-01-18 DIAGNOSIS — Z79899 Other long term (current) drug therapy: Secondary | ICD-10-CM

## 2021-01-18 DIAGNOSIS — Z7902 Long term (current) use of antithrombotics/antiplatelets: Secondary | ICD-10-CM

## 2021-01-18 DIAGNOSIS — R102 Pelvic and perineal pain: Secondary | ICD-10-CM | POA: Diagnosis not present

## 2021-01-18 DIAGNOSIS — Z20822 Contact with and (suspected) exposure to covid-19: Secondary | ICD-10-CM | POA: Diagnosis present

## 2021-01-18 DIAGNOSIS — E890 Postprocedural hypothyroidism: Secondary | ICD-10-CM | POA: Diagnosis present

## 2021-01-18 DIAGNOSIS — Z538 Procedure and treatment not carried out for other reasons: Secondary | ICD-10-CM | POA: Diagnosis not present

## 2021-01-18 DIAGNOSIS — E1169 Type 2 diabetes mellitus with other specified complication: Secondary | ICD-10-CM | POA: Diagnosis not present

## 2021-01-18 DIAGNOSIS — R509 Fever, unspecified: Secondary | ICD-10-CM | POA: Diagnosis not present

## 2021-01-18 DIAGNOSIS — Z8616 Personal history of COVID-19: Secondary | ICD-10-CM

## 2021-01-18 DIAGNOSIS — Z923 Personal history of irradiation: Secondary | ICD-10-CM | POA: Diagnosis not present

## 2021-01-18 DIAGNOSIS — K6289 Other specified diseases of anus and rectum: Secondary | ICD-10-CM

## 2021-01-18 DIAGNOSIS — K5909 Other constipation: Secondary | ICD-10-CM

## 2021-01-18 DIAGNOSIS — N136 Pyonephrosis: Secondary | ICD-10-CM | POA: Diagnosis not present

## 2021-01-18 DIAGNOSIS — Z960 Presence of urogenital implants: Secondary | ICD-10-CM | POA: Diagnosis present

## 2021-01-18 DIAGNOSIS — Z96642 Presence of left artificial hip joint: Secondary | ICD-10-CM | POA: Diagnosis present

## 2021-01-18 DIAGNOSIS — G8929 Other chronic pain: Secondary | ICD-10-CM | POA: Diagnosis present

## 2021-01-18 DIAGNOSIS — C19 Malignant neoplasm of rectosigmoid junction: Principal | ICD-10-CM | POA: Diagnosis present

## 2021-01-18 DIAGNOSIS — Z792 Long term (current) use of antibiotics: Secondary | ICD-10-CM

## 2021-01-18 DIAGNOSIS — M1990 Unspecified osteoarthritis, unspecified site: Secondary | ICD-10-CM | POA: Diagnosis present

## 2021-01-18 DIAGNOSIS — N1339 Other hydronephrosis: Secondary | ICD-10-CM | POA: Diagnosis not present

## 2021-01-18 DIAGNOSIS — E559 Vitamin D deficiency, unspecified: Secondary | ICD-10-CM | POA: Diagnosis present

## 2021-01-18 DIAGNOSIS — R1084 Generalized abdominal pain: Secondary | ICD-10-CM | POA: Diagnosis not present

## 2021-01-18 DIAGNOSIS — B9689 Other specified bacterial agents as the cause of diseases classified elsewhere: Secondary | ICD-10-CM | POA: Diagnosis present

## 2021-01-18 DIAGNOSIS — Z79890 Hormone replacement therapy (postmenopausal): Secondary | ICD-10-CM

## 2021-01-18 DIAGNOSIS — N857 Hematometra: Secondary | ICD-10-CM

## 2021-01-18 DIAGNOSIS — K5901 Slow transit constipation: Secondary | ICD-10-CM | POA: Diagnosis present

## 2021-01-18 DIAGNOSIS — N3001 Acute cystitis with hematuria: Secondary | ICD-10-CM | POA: Diagnosis not present

## 2021-01-18 DIAGNOSIS — Z7982 Long term (current) use of aspirin: Secondary | ICD-10-CM

## 2021-01-18 DIAGNOSIS — E785 Hyperlipidemia, unspecified: Secondary | ICD-10-CM | POA: Diagnosis present

## 2021-01-18 DIAGNOSIS — Z8541 Personal history of malignant neoplasm of cervix uteri: Secondary | ICD-10-CM | POA: Diagnosis not present

## 2021-01-18 DIAGNOSIS — G9341 Metabolic encephalopathy: Secondary | ICD-10-CM | POA: Diagnosis not present

## 2021-01-18 DIAGNOSIS — R1030 Lower abdominal pain, unspecified: Secondary | ICD-10-CM

## 2021-01-18 DIAGNOSIS — D128 Benign neoplasm of rectum: Secondary | ICD-10-CM | POA: Diagnosis not present

## 2021-01-18 DIAGNOSIS — Z87440 Personal history of urinary (tract) infections: Secondary | ICD-10-CM

## 2021-01-18 DIAGNOSIS — Z87891 Personal history of nicotine dependence: Secondary | ICD-10-CM

## 2021-01-18 DIAGNOSIS — R109 Unspecified abdominal pain: Secondary | ICD-10-CM | POA: Diagnosis not present

## 2021-01-18 DIAGNOSIS — Z7984 Long term (current) use of oral hypoglycemic drugs: Secondary | ICD-10-CM

## 2021-01-18 DIAGNOSIS — B961 Klebsiella pneumoniae [K. pneumoniae] as the cause of diseases classified elsewhere: Secondary | ICD-10-CM | POA: Diagnosis present

## 2021-01-18 DIAGNOSIS — N1330 Unspecified hydronephrosis: Secondary | ICD-10-CM | POA: Diagnosis not present

## 2021-01-18 DIAGNOSIS — E119 Type 2 diabetes mellitus without complications: Secondary | ICD-10-CM | POA: Diagnosis not present

## 2021-01-18 DIAGNOSIS — D509 Iron deficiency anemia, unspecified: Secondary | ICD-10-CM | POA: Diagnosis not present

## 2021-01-18 DIAGNOSIS — Z87442 Personal history of urinary calculi: Secondary | ICD-10-CM

## 2021-01-18 DIAGNOSIS — K5900 Constipation, unspecified: Secondary | ICD-10-CM | POA: Diagnosis not present

## 2021-01-18 DIAGNOSIS — Z79891 Long term (current) use of opiate analgesic: Secondary | ICD-10-CM

## 2021-01-18 DIAGNOSIS — R338 Other retention of urine: Secondary | ICD-10-CM | POA: Diagnosis not present

## 2021-01-18 DIAGNOSIS — C20 Malignant neoplasm of rectum: Secondary | ICD-10-CM | POA: Diagnosis not present

## 2021-01-18 LAB — URINALYSIS, ROUTINE W REFLEX MICROSCOPIC
Bilirubin Urine: NEGATIVE
Glucose, UA: NEGATIVE mg/dL
Ketones, ur: NEGATIVE mg/dL
Nitrite: NEGATIVE
Protein, ur: 30 mg/dL — AB
Specific Gravity, Urine: 1.015 (ref 1.005–1.030)
WBC, UA: >50 WBC/hpf — ABNORMAL HIGH (ref 0–5)
pH: 5 (ref 5.0–8.0)

## 2021-01-18 LAB — CBC WITH DIFFERENTIAL/PLATELET
Abs Immature Granulocytes: 0.04 10*3/uL (ref 0.00–0.07)
Basophils Absolute: 0 10*3/uL (ref 0.0–0.1)
Basophils Relative: 0 %
Eosinophils Absolute: 0 10*3/uL (ref 0.0–0.5)
Eosinophils Relative: 0 %
HCT: 34.6 % — ABNORMAL LOW (ref 36.0–46.0)
Hemoglobin: 11 g/dL — ABNORMAL LOW (ref 12.0–15.0)
Immature Granulocytes: 0 %
Lymphocytes Relative: 7 %
Lymphs Abs: 0.7 10*3/uL (ref 0.7–4.0)
MCH: 27.6 pg (ref 26.0–34.0)
MCHC: 31.8 g/dL (ref 30.0–36.0)
MCV: 86.9 fL (ref 80.0–100.0)
Monocytes Absolute: 0.6 10*3/uL (ref 0.1–1.0)
Monocytes Relative: 7 %
Neutro Abs: 7.9 10*3/uL — ABNORMAL HIGH (ref 1.7–7.7)
Neutrophils Relative %: 86 %
Platelets: 238 10*3/uL (ref 150–400)
RBC: 3.98 MIL/uL (ref 3.87–5.11)
RDW: 12.8 % (ref 11.5–15.5)
WBC: 9.2 10*3/uL (ref 4.0–10.5)
nRBC: 0 % (ref 0.0–0.2)

## 2021-01-18 LAB — COMPREHENSIVE METABOLIC PANEL
ALT: 15 U/L (ref 0–44)
AST: 23 U/L (ref 15–41)
Albumin: 2.8 g/dL — ABNORMAL LOW (ref 3.5–5.0)
Alkaline Phosphatase: 125 U/L (ref 38–126)
Anion gap: 13 (ref 5–15)
BUN: 12 mg/dL (ref 8–23)
CO2: 23 mmol/L (ref 22–32)
Calcium: 8.3 mg/dL — ABNORMAL LOW (ref 8.9–10.3)
Chloride: 101 mmol/L (ref 98–111)
Creatinine, Ser: 1.11 mg/dL — ABNORMAL HIGH (ref 0.44–1.00)
GFR, Estimated: 52 mL/min — ABNORMAL LOW (ref >60)
Glucose, Bld: 277 mg/dL — ABNORMAL HIGH (ref 70–99)
Potassium: 3.5 mmol/L (ref 3.5–5.1)
Sodium: 137 mmol/L (ref 135–145)
Total Bilirubin: 0.4 mg/dL (ref 0.3–1.2)
Total Protein: 6.9 g/dL (ref 6.5–8.1)

## 2021-01-18 LAB — HEMOGLOBIN A1C
Hgb A1c MFr Bld: 7 % — ABNORMAL HIGH (ref 4.8–5.6)
Mean Plasma Glucose: 154.2 mg/dL

## 2021-01-18 LAB — C DIFFICILE QUICK SCREEN W PCR REFLEX
C Diff antigen: POSITIVE — AB
C Diff toxin: NEGATIVE

## 2021-01-18 LAB — POC OCCULT BLOOD, ED: Fecal Occult Bld: POSITIVE — AB

## 2021-01-18 LAB — GLUCOSE, CAPILLARY: Glucose-Capillary: 119 mg/dL — ABNORMAL HIGH (ref 70–99)

## 2021-01-18 LAB — LIPASE, BLOOD: Lipase: 29 U/L (ref 11–51)

## 2021-01-18 MED ORDER — MORPHINE SULFATE (PF) 4 MG/ML IV SOLN
4.0000 mg | Freq: Once | INTRAVENOUS | Status: AC
  Administered 2021-01-18: 4 mg via INTRAVENOUS
  Filled 2021-01-18: qty 1

## 2021-01-18 MED ORDER — ONDANSETRON HCL 4 MG/2ML IJ SOLN
4.0000 mg | Freq: Once | INTRAMUSCULAR | Status: AC
  Administered 2021-01-18: 4 mg via INTRAVENOUS
  Filled 2021-01-18: qty 2

## 2021-01-18 MED ORDER — INSULIN ASPART 100 UNIT/ML ~~LOC~~ SOLN: 0.0000 [IU] | Freq: Every day | SUBCUTANEOUS | Status: DC

## 2021-01-18 MED ORDER — LEVOTHYROXINE SODIUM 100 MCG PO TABS
100.0000 ug | ORAL_TABLET | ORAL | Status: DC
  Administered 2021-01-21: 100 ug via ORAL
  Filled 2021-01-18: qty 1

## 2021-01-18 MED ORDER — IOHEXOL 300 MG/ML  SOLN
100.0000 mL | Freq: Once | INTRAMUSCULAR | Status: AC | PRN
  Administered 2021-01-18: 100 mL via INTRAVENOUS

## 2021-01-18 MED ORDER — ACETAMINOPHEN 325 MG PO TABS
650.0000 mg | ORAL_TABLET | Freq: Four times a day (QID) | ORAL | Status: DC | PRN
  Administered 2021-01-19: 650 mg via ORAL
  Filled 2021-01-18: qty 2

## 2021-01-18 MED ORDER — INSULIN ASPART 100 UNIT/ML ~~LOC~~ SOLN
0.0000 [IU] | Freq: Three times a day (TID) | SUBCUTANEOUS | Status: DC
  Administered 2021-01-19: 2 [IU] via SUBCUTANEOUS
  Administered 2021-01-19: 3 [IU] via SUBCUTANEOUS
  Administered 2021-01-20: 8 [IU] via SUBCUTANEOUS
  Administered 2021-01-21: 3 [IU] via SUBCUTANEOUS
  Administered 2021-01-21: 2 [IU] via SUBCUTANEOUS
  Administered 2021-01-22: 3 [IU] via SUBCUTANEOUS

## 2021-01-18 MED ORDER — SODIUM CHLORIDE 0.9 % IV BOLUS
1000.0000 mL | Freq: Once | INTRAVENOUS | Status: AC
  Administered 2021-01-18: 1000 mL via INTRAVENOUS

## 2021-01-18 MED ORDER — METHYLNALTREXONE BROMIDE 12 MG/0.6ML ~~LOC~~ SOLN
12.0000 mg | Freq: Once | SUBCUTANEOUS | Status: DC
  Filled 2021-01-18: qty 0.6

## 2021-01-18 MED ORDER — ENOXAPARIN SODIUM 40 MG/0.4ML ~~LOC~~ SOLN
40.0000 mg | SUBCUTANEOUS | Status: DC
  Administered 2021-01-18 – 2021-01-20 (×3): 40 mg via SUBCUTANEOUS
  Filled 2021-01-18 (×3): qty 0.4

## 2021-01-18 MED ORDER — ATORVASTATIN CALCIUM 40 MG PO TABS
40.0000 mg | ORAL_TABLET | Freq: Every day | ORAL | Status: DC
  Administered 2021-01-18 – 2021-01-22 (×5): 40 mg via ORAL
  Filled 2021-01-18 (×5): qty 1

## 2021-01-18 MED ORDER — SODIUM CHLORIDE 0.9 % IV SOLN
1.0000 g | Freq: Once | INTRAVENOUS | Status: AC
  Administered 2021-01-18: 1 g via INTRAVENOUS
  Filled 2021-01-18: qty 10

## 2021-01-18 MED ORDER — ONDANSETRON HCL 4 MG/2ML IJ SOLN: 4.0000 mg | Freq: Four times a day (QID) | INTRAMUSCULAR | Status: DC | PRN

## 2021-01-18 MED ORDER — POLYETHYLENE GLYCOL 3350 17 G PO PACK
17.0000 g | PACK | Freq: Two times a day (BID) | ORAL | Status: DC
  Administered 2021-01-18 – 2021-01-19 (×2): 17 g via ORAL
  Filled 2021-01-18 (×2): qty 1

## 2021-01-18 MED ORDER — INSULIN GLARGINE 100 UNIT/ML ~~LOC~~ SOLN
20.0000 [IU] | Freq: Every day | SUBCUTANEOUS | Status: DC
  Administered 2021-01-18 – 2021-01-19 (×2): 20 [IU] via SUBCUTANEOUS
  Filled 2021-01-18 (×2): qty 0.2

## 2021-01-18 MED ORDER — METHYLNALTREXONE BROMIDE 12 MG/0.6ML ~~LOC~~ SOLN: 8.0000 mg | Freq: Once | SUBCUTANEOUS | Status: DC

## 2021-01-18 MED ORDER — ACETAMINOPHEN 650 MG RE SUPP: 650.0000 mg | Freq: Four times a day (QID) | RECTAL | Status: DC | PRN

## 2021-01-18 MED ORDER — MORPHINE SULFATE (PF) 4 MG/ML IV SOLN
4.0000 mg | INTRAVENOUS | Status: DC | PRN
  Administered 2021-01-20 – 2021-01-21 (×3): 4 mg via INTRAVENOUS
  Filled 2021-01-18 (×3): qty 1

## 2021-01-18 MED ORDER — SODIUM CHLORIDE 0.9 % IV SOLN: INTRAVENOUS | Status: DC

## 2021-01-18 MED ORDER — SODIUM CHLORIDE 0.9 % IV SOLN
1.0000 g | INTRAVENOUS | Status: DC
  Administered 2021-01-19 – 2021-01-20 (×2): 1 g via INTRAVENOUS
  Filled 2021-01-18 (×2): qty 10
  Filled 2021-01-18: qty 1

## 2021-01-18 MED ORDER — ONDANSETRON HCL 4 MG PO TABS: 4.0000 mg | ORAL_TABLET | Freq: Four times a day (QID) | ORAL | Status: DC | PRN

## 2021-01-18 MED ORDER — GEMFIBROZIL 600 MG PO TABS
600.0000 mg | ORAL_TABLET | Freq: Two times a day (BID) | ORAL | Status: DC
  Administered 2021-01-18 – 2021-01-22 (×9): 600 mg via ORAL
  Filled 2021-01-18 (×11): qty 1

## 2021-01-18 MED ORDER — OXYBUTYNIN CHLORIDE ER 5 MG PO TB24
10.0000 mg | ORAL_TABLET | Freq: Every day | ORAL | Status: DC
  Administered 2021-01-18 – 2021-01-19 (×2): 10 mg via ORAL
  Filled 2021-01-18 (×2): qty 2

## 2021-01-18 NOTE — Consult Note (Signed)
Urology Consult   Physician requesting consult: Dr. Cherylann Ratel  Reason for consult: Bilateral hydronephrosis and UTI  History of Present Illness: [name] is a 75 y.o. patient followed by Dr. Aleen Campi for a history of urolithiasis and recurrent UTIs.  She takes prophylactic cephalexin 250 mg daily for UTI prevention. She presented to he ER today with altered mental status and was noted to have evidence of a UTI on UA.  CT imaging was performed and revealed bilateral mild to moderate hydronephrosis with a distended bladder and a cystic appearing mass possibly representing the uterus posterior and superior to the bladder.  Pelvic ultrasound confirms complex fluid filled uterine cavity with questions of obstruction/cervical stenosis.  Indwelling catheter has been placed with return of about 500 cc of clear urine.  She apparently had mild fever at home but no longer febrile here.   Past Medical History:  Diagnosis Date  . Arthritis   . Basal ganglia stroke ([place])   . Bilateral lower extremity edema   . Cancer ([place])   . Carotid artery occlusion   . Flank pain   . Full dentures   . History of cervical cancer    11/ 1994  Stage IB  s/p  high dose radiation brachytherapy @ [facility] 01/ 1995--- per pt no recurrence  . History of chronic bronchitis   . History of hyperthyroidism    d 03/ 2011---due to grave's disease--- s/p  RAI treatment 04/ 2011  . History of kidney stones   . History of sepsis 05/03/2018   due to UTI with klebsiella/ pyelonephritis/ ureteral obstruction cause by stone  . Hyperlipidemia   . Hypothyroidism, postradioiodine therapy    endocrinologist--  dr Loanne Drilling--  dx graves disease and s/p RAI i131 treatement 4/ 2011  . Nauseated   . Oral thrush 05/03/2018  . Pneumonia   . Type 2 diabetes mellitus treated with insulin ([place])    FOLLOWED BY PCP  . Urgency of urination   . Wears glasses     Past Surgical History:  Procedure Laterality Date  . APPENDECTOMY    .  CAROTID ENDARTERECTOMY Right 03/28/2020  . CATARACT EXTRACTION W/ INTRAOCULAR LENS  IMPLANT, BILATERAL  2004  approx.  . CYSTOSCOPY WITH STENT PLACEMENT Right 05/04/2018   Procedure: CYSTOSCOPY WITH STENT PLACEMENT AND RETROGRADE PYELOGRAM;  Surgeon: Ceasar Mons, MD;  Location: [facility];  Service: Urology;  Laterality: Right;  . CYSTOSCOPY/URETEROSCOPY/HOLMIUM LASER/STENT PLACEMENT Right 05/19/2018   Procedure: CYSTOSCOPY, URETEROSCOPY/HOLMIUM LASER, STONE BASKETRY/ STENT EXCHANGE;  Surgeon: Ceasar Mons, MD;  Location: [facility];  Service: Urology;  Laterality: Right;  . ENDARTERECTOMY Right 03/28/2020   Procedure: RIGHT CAROTID ENDARTERECTOMY with PATCH ANGIOPLASTY;  Surgeon: Rosetta Posner, MD;  Location: [place];  Service: Vascular;  Laterality: Right;  . EXCISION MASS LEFT CHEST WALL  09-20-2007   dr Grandville Silos  [facility]  . Radioactive Iodine Therapy     for thyroid  . REVISION TOTAL HIP ARTHROPLASTY Left early 2000s  . TANDEM RING INSERTION  1995   dr Aldean Ast @ [facility]   EUA w/ tandem placement in ovid  (for direct high dose radiation brachytherapy , cervical cancer)  . TOTAL HIP ARTHROPLASTY Left 1990s  . TRANSTHORACIC ECHOCARDIOGRAM  08/07/2017   mild focal basal hypertrophy of the septum,  ef 29-52%, grade 1 diastolic dysfunction/  atrial septum with lipomatous hypertrophy/  trivial PR     Current Hospital Medications:  Home meds:  No current facility-administered medications on  file prior to encounter.   Current Outpatient Medications on File Prior to Encounter  Medication Sig Dispense Refill  . atorvastatin (LIPITOR) 40 MG tablet Take 1 tablet (40 mg total) by mouth daily at 6 PM. (Patient taking differently: Take 40 mg by mouth at bedtime.) 30 tablet 1  . baclofen (LIORESAL) 10 MG tablet Take 10 mg by mouth 4 (four) times daily as needed for muscle spasms.    . cephALEXin (KEFLEX) 250 MG capsule Take 250 mg by mouth daily.     . clopidogrel  (PLAVIX) 75 MG tablet Take 1 tablet (75 mg total) by mouth daily. 21 tablet 0  . ergocalciferol (VITAMIN D2) 50000 UNITS capsule Take 50,000 Units by mouth every Sunday.     Marland Kitchen gemfibrozil (LOPID) 600 MG tablet Take 600 mg by mouth 2 (two) times daily.    . insulin degludec (TRESIBA FLEXTOUCH) 100 UNIT/ML FlexTouch Pen Inject 30 Units into the skin at bedtime. (Patient taking differently: Inject 40 Units into the skin at bedtime.) 3 mL 0  . levothyroxine (SYNTHROID) 100 MCG tablet Take 1 tablet (100 mcg total) by mouth daily before breakfast. (Patient taking differently: Take 100 mcg by mouth once a week.) 30 tablet 1  . metFORMIN (GLUCOPHAGE) 500 MG tablet Take 500 mg by mouth 2 (two) times daily with a meal.    . methocarbamol (ROBAXIN) 500 MG tablet Take 500 mg by mouth 2 (two) times daily as needed for muscle spasms.    . ondansetron (ZOFRAN-ODT) 8 MG disintegrating tablet Take 8 mg by mouth 3 (three) times daily.    Marland Kitchen oxybutynin (DITROPAN-XL) 10 MG 24 hr tablet Take 10 mg by mouth daily.    Marland Kitchen oxyCODONE ER (XTAMPZA ER) 9 MG C12A Take 9 mg by mouth every 12 (twelve) hours.    . pantoprazole (PROTONIX) 40 MG tablet Take 1 tablet (40 mg total) by mouth daily. 30 tablet 3  . triamcinolone cream (KENALOG) 0.1 % Apply 1 application topically daily as needed (vaginal itching). Mix with nystatin cream    . aspirin EC 81 MG EC tablet Take 1 tablet (81 mg total) by mouth daily. (Patient not taking: No sig reported) 30 tablet 1  . bismuth subsalicylate (PEPTO BISMOL) 262 MG/15ML suspension Please take 2 teaspoons by mouth TID (Patient not taking: No sig reported) 360 mL 0     Scheduled Meds: Continuous Infusions: PRN Meds:.  Allergies:  Allergies  Allergen Reactions  . Codeine Nausea Only    Family History  Problem Relation Age of Onset  . Emphysema Father   . Diabetes Father   . Emphysema Sister   . Emphysema Brother   . Cancer Mother        Skin Cancer  . Diabetes Mother   . Colon cancer  Neg Hx   . Esophageal cancer Neg Hx   . Rectal cancer Neg Hx   . Stomach cancer Neg Hx     Social History:  reports that she quit smoking about 38 years ago. Her smoking use included cigarettes. She quit after 5.00 years of use. She has never used smokeless tobacco. She reports that she does not drink alcohol and does not use drugs.  ROS: A complete review of systems was performed.  All systems are negative except for pertinent findings as noted.  Physical Exam:  Vital signs in last 24 hours: Temp:  [98.2 F (36.8 C)-99.3 F (37.4 C)] 99.3 F (37.4 C) (03/04 1518) Pulse Rate:  [62-100] 83 (03/04 1630)  Resp:  [11-19] 12 (03/04 1630) BP: (137-161)/(52-67) 147/57 (03/04 1630) SpO2:  [96 %-100 %] 97 % (03/04 1630) Constitutional:  Somnolent and unable to answer questions Cardiovascular: No JVD Respiratory: Normal respiratory effort, Lungs clear bilaterally GI: Abdomen is soft, nontender, nondistended, no abdominal masses GU: No CVA tenderness Lymphatic: No lymphadenopathy Neurologic: Grossly intact, no focal deficits Psychiatric: Normal mood and affect  Laboratory Data:  Recent Labs    01/18/21 1153  WBC 9.2  HGB 11.0*  HCT 34.6*  PLT 238    Recent Labs    01/18/21 1034  NA 137  K 3.5  CL 101  GLUCOSE 277*  BUN 12  CALCIUM 8.3*  CREATININE 1.11*     Results for orders placed or performed during the hospital encounter of 01/18/21 (from the past 24 hour(s))  Comprehensive metabolic panel     Status: Abnormal   Collection Time: 01/18/21 10:34 AM  Result Value Ref Range   Sodium 137 135 - 145 mmol/L   Potassium 3.5 3.5 - 5.1 mmol/L   Chloride 101 98 - 111 mmol/L   CO2 23 22 - 32 mmol/L   Glucose, Bld 277 (H) 70 - 99 mg/dL   BUN 12 8 - 23 mg/dL   Creatinine, Ser 1.11 (H) 0.44 - 1.00 mg/dL   Calcium 8.3 (L) 8.9 - 10.3 mg/dL   Total Protein 6.9 6.5 - 8.1 g/dL   Albumin 2.8 (L) 3.5 - 5.0 g/dL   AST 23 15 - 41 U/L   ALT 15 0 - 44 U/L   Alkaline Phosphatase 125  38 - 126 U/L   Total Bilirubin 0.4 0.3 - 1.2 mg/dL   GFR, Estimated 52 (L) >60 mL/min   Anion gap 13 5 - 15  Lipase, blood     Status: None   Collection Time: 01/18/21 10:34 AM  Result Value Ref Range   Lipase 29 11 - 51 U/L  POC occult blood, ED Provider will collect     Status: Abnormal   Collection Time: 01/18/21 11:05 AM  Result Value Ref Range   Fecal Occult Bld POSITIVE (A) NEGATIVE  CBC with Differential/Platelet     Status: Abnormal   Collection Time: 01/18/21 11:53 AM  Result Value Ref Range   WBC 9.2 4.0 - 10.5 K/uL   RBC 3.98 3.87 - 5.11 MIL/uL   Hemoglobin 11.0 (L) 12.0 - 15.0 g/dL   HCT 34.6 (L) 36.0 - 46.0 %   MCV 86.9 80.0 - 100.0 fL   MCH 27.6 26.0 - 34.0 pg   MCHC 31.8 30.0 - 36.0 g/dL   RDW 12.8 11.5 - 15.5 %   Platelets 238 150 - 400 K/uL   nRBC 0.0 0.0 - 0.2 %   Neutrophils Relative % 86 %   Neutro Abs 7.9 (H) 1.7 - 7.7 K/uL   Lymphocytes Relative 7 %   Lymphs Abs 0.7 0.7 - 4.0 K/uL   Monocytes Relative 7 %   Monocytes Absolute 0.6 0.1 - 1.0 K/uL   Eosinophils Relative 0 %   Eosinophils Absolute 0.0 0.0 - 0.5 K/uL   Basophils Relative 0 %   Basophils Absolute 0.0 0.0 - 0.1 K/uL   Immature Granulocytes 0 %   Abs Immature Granulocytes 0.04 0.00 - 0.07 K/uL  Urinalysis, Routine w reflex microscopic Urine, Catheterized     Status: Abnormal   Collection Time: 01/18/21  2:00 PM  Result Value Ref Range   Color, Urine YELLOW YELLOW   APPearance TURBID (A) CLEAR  Specific Gravity, Urine 1.015 1.005 - 1.030   pH 5.0 5.0 - 8.0   Glucose, UA NEGATIVE NEGATIVE mg/dL   Hgb urine dipstick MODERATE (A) NEGATIVE   Bilirubin Urine NEGATIVE NEGATIVE   Ketones, ur NEGATIVE NEGATIVE mg/dL   Protein, ur 30 (A) NEGATIVE mg/dL   Nitrite NEGATIVE NEGATIVE   Leukocytes,Ua LARGE (A) NEGATIVE   RBC / HPF 21-50 0 - 5 RBC/hpf   WBC, UA >50 (H) 0 - 5 WBC/hpf   Bacteria, UA MANY (A) NONE SEEN   Squamous Epithelial / LPF 0-5 0 - 5   WBC Clumps PRESENT    No results found  for this or any previous visit (from the past 240 hour(s)).  Renal Function: Recent Labs    01/18/21 1034  CREATININE 1.11*   Estimated Creatinine Clearance: 37.6 mL/min (A) (by C-G formula based on SCr of 1.11 mg/dL (H)).  Radiologic Imaging: US Transvaginal Non-OB  Result Date: 01/18/2021 CLINICAL DATA:  Pelvic pain, history cervical cancer, abnormal CT exam demonstrating fluid within uterus EXAM: TRANSABDOMINAL AND TRANSVAGINAL ULTRASOUND OF PELVIS TECHNIQUE: Both transabdominal and transvaginal ultrasound examinations of the pelvis were performed. Transabdominal technique was performed for global imaging of the pelvis including uterus, ovaries, adnexal regions, and pelvic cul-de-sac. It was necessary to proceed with endovaginal exam following the transabdominal exam to visualize the uterus, endometrium, and ovaries. COMPARISON:  CT abdomen and pelvis 02/18/2021 FINDINGS: Uterus Measurements: 10.4 x 6.0 x 7.7 cm = volume: 250 mL. Anteverted. Thinned myometrium. No focal mass. Endometrium Thickness: 50 mm. Endometrial canal markedly distended by complex heterogeneous fluid question blood. No discrete mass visualized Right ovary Not visualized, likely obscured by bowel Left ovary Not visualized, likely obscured by bowel Other findings Significant debris within urinary bladder, which demonstrates slightly irregular wall. No other pelvic masses. No free pelvic fluid. IMPRESSION: Progressive distension of the endometrial canal by complex fluid likely blood with thinning of myometrium. This suggests cervical stenosis, which could be due to benign stricture or mass. Nonvisualization of ovaries. Significant debris within the urinary bladder with slightly irregular bladder wall thickening, could potentially be represent cystitis or hemorrhage though this can also be seen with other etiologies including tumor, recommend correlation with urinalysis. Electronically Signed   By: Lavonia Dana M.D.   On: 01/18/2021  16:55   US Pelvis Complete  Result Date: 01/18/2021 CLINICAL DATA:  Pelvic pain, history cervical cancer, abnormal CT exam demonstrating fluid within uterus EXAM: TRANSABDOMINAL AND TRANSVAGINAL ULTRASOUND OF PELVIS TECHNIQUE: Both transabdominal and transvaginal ultrasound examinations of the pelvis were performed. Transabdominal technique was performed for global imaging of the pelvis including uterus, ovaries, adnexal regions, and pelvic cul-de-sac. It was necessary to proceed with endovaginal exam following the transabdominal exam to visualize the uterus, endometrium, and ovaries. COMPARISON:  CT abdomen and pelvis 02/18/2021 FINDINGS: Uterus Measurements: 10.4 x 6.0 x 7.7 cm = volume: 250 mL. Anteverted. Thinned myometrium. No focal mass. Endometrium Thickness: 50 mm. Endometrial canal markedly distended by complex heterogeneous fluid question blood. No discrete mass visualized Right ovary Not visualized, likely obscured by bowel Left ovary Not visualized, likely obscured by bowel Other findings Significant debris within urinary bladder, which demonstrates slightly irregular wall. No other pelvic masses. No free pelvic fluid. IMPRESSION: Progressive distension of the endometrial canal by complex fluid likely blood with thinning of myometrium. This suggests cervical stenosis, which could be due to benign stricture or mass. Nonvisualization of ovaries. Significant debris within the urinary bladder with slightly irregular bladder wall thickening, could  potentially be represent cystitis or hemorrhage though this can also be seen with other etiologies including tumor, recommend correlation with urinalysis. Electronically Signed   By: Lavonia Dana M.D.   On: 01/18/2021 16:55   CT ABDOMEN PELVIS W CONTRAST  Result Date: 01/18/2021 CLINICAL DATA:  Right lower quadrant abdominal pain with significant tenderness to palpation, vomiting, history cervical cancer, kidney stones, type II diabetes mellitus, hypertension,  stroke, appendectomy, former smoker EXAM: CT ABDOMEN AND PELVIS WITH CONTRAST TECHNIQUE: Multidetector CT imaging of the abdomen and pelvis was performed using the standard protocol following bolus administration of intravenous contrast. Sagittal and coronal MPR images reconstructed from axial data set. CONTRAST:  170m OMNIPAQUE IOHEXOL 300 MG/ML SOLN IV. No oral contrast. COMPARISON:  12/12/2019 FINDINGS: Lower chest: Dependent bibasilar atelectasis, decreased. Hepatobiliary: Gallbladder and liver normal appearance Pancreas: Atrophic pancreas without mass Spleen: Normal appearance.  Tiny splenule posterior to spleen. Adrenals/Urinary Tract: Adrenal glands normal appearance. BILATERAL renal cortical thinning without mass. BILATERAL hydronephrosis and hydroureter with wall thickening and enhancement of the ureters and renal pelves, raising question of urinary tract infection; recommend correlation with urinalysis. No urinary tract calcifications. Bladder grossly unremarkable, portions suboptimally visualized due to beam hardening artifacts in pelvis. Stomach/Bowel: Appendix surgically absent by history. Stomach decompressed. Mildly prominent stool throughout colon. Large and small bowel loops otherwise unremarkable. Vascular/Lymphatic: Atherosclerotic calcifications aorta, iliac arteries, visceral arteries. Aorta normal caliber. No adenopathy. Reproductive: Large fluid attenuation structure with a well-defined rim centrally in the pelvis adjacent to the cranial aspect of the urinary bladder, appears to represent endometrial cavity of uterus distended by fluid consistent with hydro me truss. Unremarkable adnexa. Other: No free air or free fluid. No hernia or inflammatory process. Musculoskeletal: Osseous demineralization. LEFT hip prosthesis. Scattered degenerative disc and facet disease changes of the spine. IMPRESSION: BILATERAL hydronephrosis and hydroureter with wall thickening and enhancement of the ureters and  renal pelves, raising question of urinary tract infection, with no obstructing ureteral calcifications seen; recommend correlation with urinalysis. Large fluid attenuation structure with a well-defined rim in the pelvis adjacent to the cranial aspect of the urinary bladder, appears to represent endometrial cavity of uterus distended by fluid consistent with hydrometros; recommend assessment by pelvic and transvaginal ultrasound. Post appendectomy. Prominent stool in throughout colon. Aortic Atherosclerosis (ICD10-I70.0). Electronically Signed   By: MLavonia DanaM.D.   On: 01/18/2021 12:58   DG Chest Portable 1 View  Result Date: 01/18/2021 CLINICAL DATA:  Fever EXAM: PORTABLE CHEST 1 VIEW COMPARISON:  02/10/2020 FINDINGS: Heart and mediastinal contours are within normal limits. No focal opacities or effusions. No acute bony abnormality. IMPRESSION: Negative. Electronically Signed   By: KRolm BaptiseM.D.   On: 01/18/2021 11:07    I independently reviewed the above imaging studies.  Impression/Recommendation: UTI with bilateral hydronephrosis:  Her hydronephrosis is likely related to urinary retention and she now has an indwelling catheter.  In the setting of normal renal function and no acute fever especially since she is not likely to have complete obstruction, there is no indication for urologic intervention.  Agree with catheter drainage and would be appropriate to repeat renal imaging with an ultrasound in 48 hrs to document resolution of hydronephrosis.  Unclear as to whether uterine distention/mass would be cause of either bladder outlet obstruction or bilateral ureteral obstruction.  Recommend GYN consult for further evaluation.  Otherwise, I would expect her to improve with catheter drainage and appropriate antibiotic therapy.  Will follow.  LDutch Gray3/02/2021, 6:26 PM  LWynetta Emery  Hollice Espy. MD   CC: Dr. Cherylann Ratel

## 2021-01-18 NOTE — ED Notes (Signed)
Called lab to check on urinalysis. Should be updated in chart in about 3 minutes

## 2021-01-18 NOTE — Consult Note (Addendum)
Referring Provider: Reino Kent PA-C Primary Care Physician:  Everardo Beals, NP Primary Gastroenterologist:  Dr. Tarri Glenn   Reason for Consultation:  Abdominal pain, constipation   HPI: [name] is a 75 y.o. female with a past medical history of obesity, arthritis, hypertension, hyperlipidemia, CVA 11/2019 on Plavix,  s/p right carotid endarterectomy 03/2020,  DM II, kidney stones, hypothyroidism, cervical cancer s/p radiation therapy in the 1990's and IDA.  Past appendectomy, left total hip replacement and cystoscopy with ureter stent placement 2019.  She is followed by Dr. Tarri Glenn from our GI practice.She underwent an EGD and colonoscopy by Dr. Tarri Glenn on 01/02/2021 due to a history of IDA and chronic diarrhea.  The EGD identified gastritis, no evidence of Helicobacter pylori.  The colonoscopy was incomplete due to a fecal impaction which required manual disimpaction and the rectal mucosa was friable with contact bleeding most likely due to stercoral colitis.  It was thought her chronic diarrhea was most likely overflow diarrhea.  She takes oxycodone twice daily for neuropathy associated pain. She was prescribed MiraLAX twice daily with plans to repeat a colonoscopy 01/25/2021.  She continued to have lower abdominal pain with constipation since her colonoscopy.  She has intermittent nausea and has vomited nonbilious nonbloody emesis twice since the end of December.  Her lower abdominal pain has progressively worsened and she developed a fever of 101.91F yesterday evening.  Her daughter called our answering service and the patient was advised by Dr. Henrene Pastor to present to the ED for further evaluation which she refused to do at that time.  She awakened this morning with worsening abdominal pain and she presented to the ED for further evaluation.  Labs in the ED showed a Sodium level 137.  Potassium 3.5.  Glucose 277.  BUN 12.  Creatinine 1.11 (baseline Cr 0.80).  Alk phos 125.  AST 23.  ALT 15.   Albumin 2.8.  Total bili 0.4.  WBC 9.2.  Hemoglobin 11.0 ( Hg 10.9 on 10/01/2020).  MCV 86.9.  Platelet 238.  Sars Coronavirus 2 pending.  A urinalysis with + RBC 21-51 and  + Leukocytes.  An abdominal/pelvic CT scan showed bilateral hydronephrosis and hydroureter with wall thickening concerning for UTI.  She received Rocephin IV x1.  A large fluid collection with a well-defined rim in the pelvis/endometrial cavity.  Intravaginal pelvic ultrasound was ordered, not yet completed.  Prominent stool was noted throughout the colon.  A GI consult was requested for the management of the patient's constipation and lower abdominal pain.  Dr. Loletha Carrow  reviewed the CT scan and he assessed the pelvic fluid collection appears to be causing extrinsic compression of the sigmoid colon.   In the ED, she was shaking, near rigors.  She received a dose of Morphine she reduced her abdominal pain and she is currently resting more comfortably.  Her daughter is at the bedside who facilitated obtaining the patient's history.  She stated her mother's lower abdominal pain has persisted since her attempted colonoscopy 01/02/2021 with ongoing loose stools.  Decreased oral intake with some weight loss as well.  She stated her mother was slightly confused last evening when she was febrile.  No NSAID use.  Her last dose of Plavix was taken at 8 PM on 01/17/2021.   CTAP 01/18/2021: BILATERAL hydronephrosis and hydroureter with wall thickening and enhancement of the ureters and renal pelves, raising question of urinary tract infection, with no obstructing ureteral calcifications seen; recommend correlation with urinalysis.  Large fluid attenuation structure  with a well-defined rim in the pelvis adjacent to the cranial aspect of the urinary bladder, appears to represent endometrial cavity of uterus distended by fluid consistent with hydrometros; recommend assessment by pelvic and transvaginal ultrasound. Post appendectomy. Prominent  stool in throughout colon. Aortic Atherosclerosis   CTAP with contrast 12/12/2019: Mild fullness renal collecting systems and proximal to mid ureters, which may related to bladder distension. Normal caliber but primarily fluid-filled colon. Stable findings of hematometra.  EGD 01/02/2021: - Z-line irregular, 37 cm from the incisors. Biopsied. - Gastritis. Biopsied. - Normal examined duodenum. Biopsied. - The examination was otherwise normal.  Colonoscopy 01/02/2021: - Preparation of the colon was poor. - Fecal impaction. Manual disimpaction performed. - Stool in the entire examined colon limiting any meaningful evaluation. . - Erythematous and friable (with contact bleeding) mucosa in the rectum. Likely stercoral Colitis. Biopsy Report: 1. Surgical [P], duodenal - DUODENAL MUCOSA WITH NO SIGNIFICANT PATHOLOGIC FINDINGS. - NEGATIVE FOR INCREASED INTRAEPITHELIAL LYMPHOCYTES AND VILLOUS ARCHITECTURAL CHANGES. 2. Surgical [P], fundus, gastric antrum and gastric body - GASTRIC ANTRAL AND OXYNTIC MUCOSA WITH MILD CHRONIC GASTRITIS AND REACTIVE CHANGES. - WARTHIN-STARRY STAIN IS NEGATIVE FOR HELICOBACTER PYLORI. 3. Surgical [P], distal esophagus - SQUAMOCOLUMNAR ESOPHAGEAL MUCOSA WITH REACTIVE/REGENERATIVE CHANGES. - NEGATIVE FOR INTESTINAL METAPLASIA (GOBLET CELL METAPLASIA). - NEGATIVE FOR INCREASED INTRAEPITHELIAL EOSINOPHILS  Colonoscopy 09/03/2018: Failed attempt at colonoscopy due to poor prep and retained stool.   Past Medical History:  Diagnosis Date  . Arthritis   . Basal ganglia stroke ([place])   . Bilateral lower extremity edema   . Cancer ([place])   . Carotid artery occlusion   . Flank pain   . Full dentures   . History of cervical cancer    11/ 1994  Stage IB  s/p  high dose radiation brachytherapy @ [facility] 01/ 1995--- per pt no recurrence  . History of chronic bronchitis   . History of hyperthyroidism    d 03/ 2011---due to grave's disease--- s/p  RAI treatment 04/ 2011   . History of kidney stones   . History of sepsis 05/03/2018   due to UTI with klebsiella/ pyelonephritis/ ureteral obstruction cause by stone  . Hyperlipidemia   . Hypothyroidism, postradioiodine therapy    endocrinologist--  dr Loanne Drilling--  dx graves disease and s/p RAI i131 treatement 4/ 2011  . Nauseated   . Oral thrush 05/03/2018  . Pneumonia   . Type 2 diabetes mellitus treated with insulin ([place])    FOLLOWED BY PCP  . Urgency of urination   . Wears glasses     Past Surgical History:  Procedure Laterality Date  . APPENDECTOMY    . CAROTID ENDARTERECTOMY Right 03/28/2020  . CATARACT EXTRACTION W/ INTRAOCULAR LENS  IMPLANT, BILATERAL  2004  approx.  . CYSTOSCOPY WITH STENT PLACEMENT Right 05/04/2018   Procedure: CYSTOSCOPY WITH STENT PLACEMENT AND RETROGRADE PYELOGRAM;  Surgeon: Ceasar Mons, MD;  Location: [facility];  Service: Urology;  Laterality: Right;  . CYSTOSCOPY/URETEROSCOPY/HOLMIUM LASER/STENT PLACEMENT Right 05/19/2018   Procedure: CYSTOSCOPY, URETEROSCOPY/HOLMIUM LASER, STONE BASKETRY/ STENT EXCHANGE;  Surgeon: Ceasar Mons, MD;  Location: [facility];  Service: Urology;  Laterality: Right;  . ENDARTERECTOMY Right 03/28/2020   Procedure: RIGHT CAROTID ENDARTERECTOMY with PATCH ANGIOPLASTY;  Surgeon: Rosetta Posner, MD;  Location: [place];  Service: Vascular;  Laterality: Right;  . EXCISION MASS LEFT CHEST WALL  09-20-2007   dr Grandville Silos  [facility]  . Radioactive Iodine Therapy     for thyroid  . REVISION  TOTAL HIP ARTHROPLASTY Left early 2000s  . TANDEM RING INSERTION  1995   dr Aldean Ast @ [facility]   EUA w/ tandem placement in ovid  (for direct high dose radiation brachytherapy , cervical cancer)  . TOTAL HIP ARTHROPLASTY Left 1990s  . TRANSTHORACIC ECHOCARDIOGRAM  08/07/2017   mild focal basal hypertrophy of the septum,  ef 16-10%, grade 1 diastolic dysfunction/  atrial septum with lipomatous hypertrophy/  trivial PR    Prior to Admission  medications   Medication Sig Start Date End Date Taking? Authorizing Provider  atorvastatin (LIPITOR) 40 MG tablet Take 1 tablet (40 mg total) by mouth daily at 6 PM. Patient taking differently: Take 40 mg by mouth at bedtime. 12/15/19  Yes Hosie Poisson, MD  baclofen (LIORESAL) 10 MG tablet Take 10 mg by mouth 4 (four) times daily as needed for muscle spasms. 12/11/20  Yes [provider]  cephALEXin (KEFLEX) 250 MG capsule Take 250 mg by mouth daily.    Yes [provider]  clopidogrel (PLAVIX) 75 MG tablet Take 1 tablet (75 mg total) by mouth daily. 12/16/19  Yes Hosie Poisson, MD  ergocalciferol (VITAMIN D2) 50000 UNITS capsule Take 50,000 Units by mouth every Sunday.    Yes [provider]  gemfibrozil (LOPID) 600 MG tablet Take 600 mg by mouth 2 (two) times daily.   Yes [provider]  insulin degludec (TRESIBA FLEXTOUCH) 100 UNIT/ML FlexTouch Pen Inject 30 Units into the skin at bedtime. Patient taking differently: Inject 40 Units into the skin at bedtime. 10/01/20  Yes Sanjuan Dame, MD  levothyroxine (SYNTHROID) 100 MCG tablet Take 1 tablet (100 mcg total) by mouth daily before breakfast. Patient taking differently: Take 100 mcg by mouth once a week. 12/16/19  Yes Hosie Poisson, MD  metFORMIN (GLUCOPHAGE) 500 MG tablet Take 500 mg by mouth 2 (two) times daily with a meal.   Yes [provider]  methocarbamol (ROBAXIN) 500 MG tablet Take 500 mg by mouth 2 (two) times daily as needed for muscle spasms.   Yes [provider]  ondansetron (ZOFRAN-ODT) 8 MG disintegrating tablet Take 8 mg by mouth 3 (three) times daily. 12/18/20  Yes [provider]  oxybutynin (DITROPAN-XL) 10 MG 24 hr tablet Take 10 mg by mouth daily. 01/13/21  Yes [provider]  oxyCODONE ER (XTAMPZA ER) 9 MG C12A Take 9 mg by mouth every 12 (twelve) hours.   Yes [provider]  pantoprazole (PROTONIX) 40 MG tablet Take 1 tablet (40 mg total)  by mouth daily. 01/11/21  Yes Thornton Park, MD  triamcinolone cream (KENALOG) 0.1 % Apply 1 application topically daily as needed (vaginal itching). Mix with nystatin cream 09/19/20  Yes [provider]  aspirin EC 81 MG EC tablet Take 1 tablet (81 mg total) by mouth daily. Patient not taking: No sig reported 12/16/19   Hosie Poisson, MD  bismuth subsalicylate (PEPTO BISMOL) 262 MG/15ML suspension Please take 2 teaspoons by mouth TID Patient not taking: No sig reported 12/19/20   Thornton Park, MD    Current Facility-Administered Medications  Medication Dose Route Frequency Provider Last Rate Last Admin  . methylnaltrexone (RELISTOR) injection 12 mg  12 mg Subcutaneous Once [place], Greggory Brandy, MD       Current Outpatient Medications  Medication Sig Dispense Refill  . atorvastatin (LIPITOR) 40 MG tablet Take 1 tablet (40 mg total) by mouth daily at 6 PM. (Patient taking differently: Take 40 mg by mouth at bedtime.) 30 tablet 1  .  baclofen (LIORESAL) 10 MG tablet Take 10 mg by mouth 4 (four) times daily as needed for muscle spasms.    . cephALEXin (KEFLEX) 250 MG capsule Take 250 mg by mouth daily.     . clopidogrel (PLAVIX) 75 MG tablet Take 1 tablet (75 mg total) by mouth daily. 21 tablet 0  . ergocalciferol (VITAMIN D2) 50000 UNITS capsule Take 50,000 Units by mouth every Sunday.     Marland Kitchen gemfibrozil (LOPID) 600 MG tablet Take 600 mg by mouth 2 (two) times daily.    . insulin degludec (TRESIBA FLEXTOUCH) 100 UNIT/ML FlexTouch Pen Inject 30 Units into the skin at bedtime. (Patient taking differently: Inject 40 Units into the skin at bedtime.) 3 mL 0  . levothyroxine (SYNTHROID) 100 MCG tablet Take 1 tablet (100 mcg total) by mouth daily before breakfast. (Patient taking differently: Take 100 mcg by mouth once a week.) 30 tablet 1  . metFORMIN (GLUCOPHAGE) 500 MG tablet Take 500 mg by mouth 2 (two) times daily with a meal.    . methocarbamol (ROBAXIN) 500 MG tablet Take 500 mg by mouth 2  (two) times daily as needed for muscle spasms.    . ondansetron (ZOFRAN-ODT) 8 MG disintegrating tablet Take 8 mg by mouth 3 (three) times daily.    Marland Kitchen oxybutynin (DITROPAN-XL) 10 MG 24 hr tablet Take 10 mg by mouth daily.    Marland Kitchen oxyCODONE ER (XTAMPZA ER) 9 MG C12A Take 9 mg by mouth every 12 (twelve) hours.    . pantoprazole (PROTONIX) 40 MG tablet Take 1 tablet (40 mg total) by mouth daily. 30 tablet 3  . triamcinolone cream (KENALOG) 0.1 % Apply 1 application topically daily as needed (vaginal itching). Mix with nystatin cream    . aspirin EC 81 MG EC tablet Take 1 tablet (81 mg total) by mouth daily. (Patient not taking: No sig reported) 30 tablet 1  . bismuth subsalicylate (PEPTO BISMOL) 262 MG/15ML suspension Please take 2 teaspoons by mouth TID (Patient not taking: No sig reported) 360 mL 0    Allergies as of 01/18/2021 - Review Complete 01/18/2021  Allergen Reaction Noted  . Codeine Nausea Only 10/19/2008    Family History  Problem Relation Age of Onset  . Emphysema Father   . Diabetes Father   . Emphysema Sister   . Emphysema Brother   . Cancer Mother        Skin Cancer  . Diabetes Mother   . Colon cancer Neg Hx   . Esophageal cancer Neg Hx   . Rectal cancer Neg Hx   . Stomach cancer Neg Hx     Social History   Socioeconomic History  . Marital status: Married    Spouse name: Not on file  . Number of children: 2  . Years of education: Not on file  . Highest education level: Not on file  Occupational History  . Occupation: Disabled  Tobacco Use  . Smoking status: Former Smoker    Years: 5.00    Types: Cigarettes    Quit date: 05/13/1982    Years since quitting: 38.7  . Smokeless tobacco: Never Used  Vaping Use  . Vaping Use: Never used  Substance and Sexual Activity  . Alcohol use: No  . Drug use: No  . Sexual activity: Not Currently  Other Topics Concern  . Not on file  Social History Narrative   Married and lives with husband   Has 2 children     Housewife   Disabled  2-3 cups of caffeine    R handed    Social Determinants of Health   Financial Resource Strain: Not on file  Food Insecurity: Not on file  Transportation Needs: Not on file  Physical Activity: Not on file  Stress: Not on file  Social Connections: Not on file  Intimate Partner Violence: Not on file    Review of Systems: Gen: + fever and weight loss.  CV: Denies chest pain, palpitations or edema. Resp: Denies cough, shortness of breath of hemoptysis.  GI: See HPI. GU : Denies dysuria or hematuria. MS: + Chronic bilateral leg pain Derm: Denies rash, itchiness, skin lesions or unhealing ulcers. Psych: Denies depression, anxiety or memory loss. Heme: Denies easy bruising, bleeding. Neuro:  + Lower extremity neuropathy. Endo:  + Diabetes.   Physical Exam: Vital signs in last 24 hours: Temp:  [98.2 F (36.8 C)] 98.2 F (36.8 C) (03/04 0951) Pulse Rate:  [62-79] 65 (03/04 1430) Resp:  [11-19] 16 (03/04 1430) BP: (137-161)/(52-67) 143/61 (03/04 1430) SpO2:  [96 %-100 %] 100 % (03/04 1430)   General: Alert  Ill-appearing 75 year old female Head:  Normocephalic and atraumatic. Eyes:  No scleral icterus. Conjunctiva pink. Ears:  Normal auditory acuity. Nose:  No deformity, discharge or lesions. Mouth: Upper and lower dentures intact. no ulcers or lesions.  Neck:  Supple. No lymphadenopathy or thyromegaly.  Lungs: Breath sounds clear, diminished in the bases bilaterally. Heart: Regular rate and rhythm, no murmurs. Abdomen: Mild abdominal distention, central lower abdomen is firm and tender without rebound or guarding.  Hypoactive bowel sounds to all 4 quadrants.  Lower midline abdominal scar intact. Rectal: No external hemorrhoids.  Internal rectal exam identified a moderate amount of soft loose golden brown guaiac stool which poured out of the rectum.  An extrinsic rock hard area palpated from within the anterior rectal wall, most likely bony pelvic mass  from prior radiation. No mass within the rectum.   Musculoskeletal:  Symmetrical without gross deformities.  Pulses:  Normal pulses noted. Extremities:  Without clubbing or edema. Neurologic:  Alert and  oriented x4. No focal deficits.  Skin:  Intact without significant lesions or rashes. Psych:  Alert, lethargic and cooperative.  Intake/Output from previous day: No intake/output data recorded. Intake/Output this shift: Total I/O In: 1000 [IV Piggyback:1000] Out: 400 [Urine:400]  Lab Results: Recent Labs    01/18/21 1153  WBC 9.2  HGB 11.0*  HCT 34.6*  PLT 238   BMET Recent Labs    01/18/21 1034  NA 137  K 3.5  CL 101  CO2 23  GLUCOSE 277*  BUN 12  CREATININE 1.11*  CALCIUM 8.3*   LFT Recent Labs    01/18/21 1034  PROT 6.9  ALBUMIN 2.8*  AST 23  ALT 15  ALKPHOS 125  BILITOT 0.4   PT/INR No results for input(s): LABPROT, INR in the last 72 hours. Hepatitis Panel No results for input(s): HEPBSAG, HCVAB, HEPAIGM, HEPBIGM in the last 72 hours.    Studies/Results: CT ABDOMEN PELVIS W CONTRAST  Result Date: 01/18/2021 CLINICAL DATA:  Right lower quadrant abdominal pain with significant tenderness to palpation, vomiting, history cervical cancer, kidney stones, type II diabetes mellitus, hypertension, stroke, appendectomy, former smoker EXAM: CT ABDOMEN AND PELVIS WITH CONTRAST TECHNIQUE: Multidetector CT imaging of the abdomen and pelvis was performed using the standard protocol following bolus administration of intravenous contrast. Sagittal and coronal MPR images reconstructed from axial data set. CONTRAST:  1107m OMNIPAQUE IOHEXOL 300 MG/ML SOLN IV. No  oral contrast. COMPARISON:  12/12/2019 FINDINGS: Lower chest: Dependent bibasilar atelectasis, decreased. Hepatobiliary: Gallbladder and liver normal appearance Pancreas: Atrophic pancreas without mass Spleen: Normal appearance.  Tiny splenule posterior to spleen. Adrenals/Urinary Tract: Adrenal glands normal  appearance. BILATERAL renal cortical thinning without mass. BILATERAL hydronephrosis and hydroureter with wall thickening and enhancement of the ureters and renal pelves, raising question of urinary tract infection; recommend correlation with urinalysis. No urinary tract calcifications. Bladder grossly unremarkable, portions suboptimally visualized due to beam hardening artifacts in pelvis. Stomach/Bowel: Appendix surgically absent by history. Stomach decompressed. Mildly prominent stool throughout colon. Large and small bowel loops otherwise unremarkable. Vascular/Lymphatic: Atherosclerotic calcifications aorta, iliac arteries, visceral arteries. Aorta normal caliber. No adenopathy. Reproductive: Large fluid attenuation structure with a well-defined rim centrally in the pelvis adjacent to the cranial aspect of the urinary bladder, appears to represent endometrial cavity of uterus distended by fluid consistent with hydro me truss. Unremarkable adnexa. Other: No free air or free fluid. No hernia or inflammatory process. Musculoskeletal: Osseous demineralization. LEFT hip prosthesis. Scattered degenerative disc and facet disease changes of the spine. IMPRESSION: BILATERAL hydronephrosis and hydroureter with wall thickening and enhancement of the ureters and renal pelves, raising question of urinary tract infection, with no obstructing ureteral calcifications seen; recommend correlation with urinalysis. Large fluid attenuation structure with a well-defined rim in the pelvis adjacent to the cranial aspect of the urinary bladder, appears to represent endometrial cavity of uterus distended by fluid consistent with hydrometros; recommend assessment by pelvic and transvaginal ultrasound. Post appendectomy. Prominent stool in throughout colon. Aortic Atherosclerosis (ICD10-I70.0). Electronically Signed   By: Lavonia Dana M.D.   On: 01/18/2021 12:58   DG Chest Portable 1 View  Result Date: 01/18/2021 CLINICAL DATA:  Fever  EXAM: PORTABLE CHEST 1 VIEW COMPARISON:  02/10/2020 FINDINGS: Heart and mediastinal contours are within normal limits. No focal opacities or effusions. No acute bony abnormality. IMPRESSION: Negative. Electronically Signed   By: Rolm Baptise M.D.   On: 01/18/2021 11:07    IMPRESSION/PLAN:  60.  75 year old female with IDA, chronic lower abdominal pain and constipation presents to the ED with fever and worsening lower abdominal pain. CTAP showed evidence of bilateral hydronephrosis and hydroureter with bladder wall thickening concerning for UTI developing pyelonephritis.  A large fluid collection to the endometrial cavity was identified which appears to be causing extrinsic compression of the sigmoid colon.  A pelvic ultrasound was ordered by the ED PA-C.  Urine and blood cultures collected.  She is currently afebrile and hemodynamically stable. -Urology consult recommended -Antibiotics if UTI per the medical team -Blood cultures -MiraLAX p.o. twice daily -No plans for a diagnostic colonoscopy at this time, however, our service will reconsider a colonoscopy during her hospital admission in a few days if her overall clinical status stabilizes -CBC in a.m. -IV fluids and pain management per the medical team -Further recommendations per Dr. Loletha Carrow  2.  History of CVA 11/2019 on Plavix. S/P right carotid endarterectomy 03/2020.  Last dose of Plavix was taken at 8 PM on 01/17/2021.  3.  Diabetes mellitus type 2  4.  Chronic neuropathy and oxycodone twice daily  5.  History of cervical cancer status post radiation therapy in the [place]  01/18/2021, 2:57 PM    I have reviewed the entire case in detail with the above APP and discussed the plan in detail.  Therefore, I agree with the diagnoses recorded above. In addition,  I have personally interviewed and examined  the patient and have personally reviewed any abdominal/pelvic CT scan images (from today and prior scan on January  25).  My additional thoughts are as follows:  I saw this patient with our nurse practitioner, and the patient's daughter was at the bedside for the entire visit.  I performed a physical exam including rectal exam with findings as above.  I have not previously met this patient and I do not know what her baseline abdominal pain and exam are like.  It is therefore difficult to determine how much the chronic constipation is contributing to her abdominal pain, but I suspect it does not explain her acute worsening of pain reported by her daughter.  Patient currently cannot give much helpful history since she is somnolent after receiving some morphine.  Not long ago she had rigors and overall is behaving as if she has some systemic infection.  Reviewing the CT scans, allowing for some technical differences between the 2 scans, the pelvic fluid collection and bilateral hydronephrosis appear similar.  Nevertheless, she apparently has a history of obstructing renal or ureteral stone requiring a procedure for removal.  Given her overall presentation, I am concerned she may have urosepsis and I spoke with the ED provider to recommend urology consultation and evaluation by the internal medicine service for admission. While I note that this pelvic fluid collection is adjacent to and appears to extrinsically compressed the sigmoid, that is probably not of clinical significance at present since there is no proximal colonic dilatation.  The probable radiation-induced pelvic findings on rectal exam may be contributing to poor anorectal motility and therefore worsening the constipation.   This patient does not have impaction on rectal exam.  There is copious soft stool that passes spontaneously but slowly after rectal exam.   We will follow this patient and maintain her on an oral bowel regimen.  Hopefully as her apparent infectious illness improves, her oral intake will improve and we will be able to make more progress  in that regard.  She is currently scheduled for an outpatient colonoscopy toward the end of next week, but it appears to me that it would be logistically very challenging for this patient and her family to adequately prep for that procedure.  Therefore, we may do that during this admission if her clinical status allows.  Please hold Plavix for that reason.  Regular diet as tolerated.   Nelida Meuse III Office:[phone]

## 2021-01-18 NOTE — Progress Notes (Signed)
According to [employer], pt was advised that she could walk in to have her Abd Xray at [facility] location at her earliest convenience, no appt required. To date, pt still has not completed exam. Called pt and spoke with her husband, [name]. Reminded about conversation had by [employer] re: walking in for abd xray. Pt husband stopped me and stated, "she ain't going to go no how.", "she just wants me to sit and watch her." Strongly encouraged pt husband to ensure she has this exam completed as soon as she is able. Husband states, "well she ain't going to go." Advised I will route this message to Dr. Tarri Glenn to make her aware.

## 2021-01-18 NOTE — ED Notes (Signed)
Called report to Judson Roch, Therapist, sports

## 2021-01-18 NOTE — Progress Notes (Signed)
Thank you for your efforts.

## 2021-01-18 NOTE — H&P (Signed)
History and Physical    [name]:[id number] DOB: [birth year] DOA: 01/18/2021  PCP: Everardo Beals, NP  Patient coming from: Home  Chief Complaint: abdominal pain  HPI: [name] is a 75 y.o. female with medical history significant of cervical cancer, DM2, HTN, renal stones, chronic abdominal pain. Presenting with abdominal pain. History per daughter as patient is confused. Daughter reports that patient has had abdominal pain for several months. She has been followed by [facility] and has had c-scope and EGD performed. Her C-scope was not able to be completed due to fecal impaction. She was scheduled for a follow up c-scope next week. Her LLQ abdominal pain has been worsening over the last few days. This prompted a call to [facility] and that is when the c-scope was moved to next week. Last night, her daughter found the pain febrile with chills. Tempw as up to 103. She was nauseous and had some vomiting. The daughter called [facility] and it was recommended that the patient come to the ED. However, the patient refused. When the patient woke this morning, her symptoms continued and now included confusion. The family brought her to the ED for help. They deny any other aggravating or alleviating factors.   ED Course: CT found a large amount of stool in the colon. It also found a fluid collection in the pelvic cavity. This collection looks like it's causing hydronephrosis. Urology was consulted. Per EDPA, recommended foley and abx for UTI. [facility] was consulted. OBGyn was consulted. Transvaginal US ordered. TR[facility] called for admission.   Review of Systems:  Unable to obtain d/t mentation.  PMHx Past Medical History:  Diagnosis Date  . Arthritis   . Basal ganglia stroke ([place])   . Bilateral lower extremity edema   . Cancer ([place])   . Carotid artery occlusion   . Flank pain   . Full dentures   . History of cervical cancer    11/ 1994  Stage IB  s/p  high dose radiation brachytherapy @ [facility] 01/ 1995---  per pt no recurrence  . History of chronic bronchitis   . History of hyperthyroidism    d 03/ 2011---due to grave's disease--- s/p  RAI treatment 04/ 2011  . History of kidney stones   . History of sepsis 05/03/2018   due to UTI with klebsiella/ pyelonephritis/ ureteral obstruction cause by stone  . Hyperlipidemia   . Hypothyroidism, postradioiodine therapy    endocrinologist--  dr Loanne Drilling--  dx graves disease and s/p RAI i131 treatement 4/ 2011  . Nauseated   . Oral thrush 05/03/2018  . Pneumonia   . Type 2 diabetes mellitus treated with insulin ([place])    FOLLOWED BY PCP  . Urgency of urination   . Wears glasses     PSHx Past Surgical History:  Procedure Laterality Date  . APPENDECTOMY    . CAROTID ENDARTERECTOMY Right 03/28/2020  . CATARACT EXTRACTION W/ INTRAOCULAR LENS  IMPLANT, BILATERAL  2004  approx.  . CYSTOSCOPY WITH STENT PLACEMENT Right 05/04/2018   Procedure: CYSTOSCOPY WITH STENT PLACEMENT AND RETROGRADE PYELOGRAM;  Surgeon: Ceasar Mons, MD;  Location: [facility];  Service: Urology;  Laterality: Right;  . CYSTOSCOPY/URETEROSCOPY/HOLMIUM LASER/STENT PLACEMENT Right 05/19/2018   Procedure: CYSTOSCOPY, URETEROSCOPY/HOLMIUM LASER, STONE BASKETRY/ STENT EXCHANGE;  Surgeon: Ceasar Mons, MD;  Location: [facility];  Service: Urology;  Laterality: Right;  . ENDARTERECTOMY Right 03/28/2020   Procedure: RIGHT CAROTID ENDARTERECTOMY with PATCH ANGIOPLASTY;  Surgeon: Rosetta Posner, MD;  Location:  [facility] OR;  Service: Vascular;  Laterality: Right;  . EXCISION MASS LEFT CHEST WALL  09-20-2007   dr Grandville Silos  [facility]  . Radioactive Iodine Therapy     for thyroid  . REVISION TOTAL HIP ARTHROPLASTY Left early 2000s  . TANDEM RING INSERTION  1995   dr Aldean Ast @ [facility]   EUA w/ tandem placement in ovid  (for direct high dose radiation brachytherapy , cervical cancer)  . TOTAL HIP ARTHROPLASTY Left 1990s  . TRANSTHORACIC ECHOCARDIOGRAM  08/07/2017   mild  focal basal hypertrophy of the septum,  ef 86-76%, grade 1 diastolic dysfunction/  atrial septum with lipomatous hypertrophy/  trivial PR    SocHx  reports that she quit smoking about 38 years ago. Her smoking use included cigarettes. She quit after 5.00 years of use. She has never used smokeless tobacco. She reports that she does not drink alcohol and does not use drugs.  Allergies  Allergen Reactions  . Codeine Nausea Only    FamHx Family History  Problem Relation Age of Onset  . Emphysema Father   . Diabetes Father   . Emphysema Sister   . Emphysema Brother   . Cancer Mother        Skin Cancer  . Diabetes Mother   . Colon cancer Neg Hx   . Esophageal cancer Neg Hx   . Rectal cancer Neg Hx   . Stomach cancer Neg Hx     Prior to Admission medications   Medication Sig Start Date End Date Taking? Authorizing Provider  atorvastatin (LIPITOR) 40 MG tablet Take 1 tablet (40 mg total) by mouth daily at 6 PM. Patient taking differently: Take 40 mg by mouth at bedtime. 12/15/19  Yes Hosie Poisson, MD  baclofen (LIORESAL) 10 MG tablet Take 10 mg by mouth 4 (four) times daily as needed for muscle spasms. 12/11/20  Yes [provider]  cephALEXin (KEFLEX) 250 MG capsule Take 250 mg by mouth daily.    Yes [provider]  clopidogrel (PLAVIX) 75 MG tablet Take 1 tablet (75 mg total) by mouth daily. 12/16/19  Yes Hosie Poisson, MD  ergocalciferol (VITAMIN D2) 50000 UNITS capsule Take 50,000 Units by mouth every Sunday.    Yes [provider]  gemfibrozil (LOPID) 600 MG tablet Take 600 mg by mouth 2 (two) times daily.   Yes [provider]  insulin degludec (TRESIBA FLEXTOUCH) 100 UNIT/ML FlexTouch Pen Inject 30 Units into the skin at bedtime. Patient taking differently: Inject 40 Units into the skin at bedtime. 10/01/20  Yes Sanjuan Dame, MD  levothyroxine (SYNTHROID) 100 MCG tablet Take 1 tablet (100 mcg total) by mouth daily before breakfast. Patient  taking differently: Take 100 mcg by mouth once a week. 12/16/19  Yes Hosie Poisson, MD  metFORMIN (GLUCOPHAGE) 500 MG tablet Take 500 mg by mouth 2 (two) times daily with a meal.   Yes [provider]  methocarbamol (ROBAXIN) 500 MG tablet Take 500 mg by mouth 2 (two) times daily as needed for muscle spasms.   Yes [provider]  ondansetron (ZOFRAN-ODT) 8 MG disintegrating tablet Take 8 mg by mouth 3 (three) times daily. 12/18/20  Yes [provider]  oxybutynin (DITROPAN-XL) 10 MG 24 hr tablet Take 10 mg by mouth daily. 01/13/21  Yes [provider]  oxyCODONE ER (XTAMPZA ER) 9 MG C12A Take 9 mg by mouth every 12 (twelve) hours.   Yes [provider]  pantoprazole (PROTONIX) 40 MG tablet Take 1 tablet (40  mg total) by mouth daily. 01/11/21  Yes Thornton Park, MD  triamcinolone cream (KENALOG) 0.1 % Apply 1 application topically daily as needed (vaginal itching). Mix with nystatin cream 09/19/20  Yes [provider]  aspirin EC 81 MG EC tablet Take 1 tablet (81 mg total) by mouth daily. Patient not taking: No sig reported 12/16/19   Hosie Poisson, MD  bismuth subsalicylate (PEPTO BISMOL) 262 MG/15ML suspension Please take 2 teaspoons by mouth TID Patient not taking: No sig reported 12/19/20   Thornton Park, MD    Physical Exam: Vitals:   01/18/21 1345 01/18/21 1430 01/18/21 1518 01/18/21 1530  BP: (!) 159/64 (!) 143/61  (!) 155/61  Pulse: 63 65  100  Resp: 16 16  18   Temp:   99.3 F (37.4 C)   TempSrc:   Oral   SpO2: 100% 100%  99%    General: 76 y.o. female resting in bed in NAD Eyes: PERRL, normal sclera ENMT: Nares patent w/o discharge, orophaynx clear, dentition normal, ears w/o discharge/lesions/ulcers Neck: Supple, trachea midline Cardiovascular: RRR, +S1, S2, no m/g/r, equal pulses throughout Respiratory: CTABL, no w/r/r, normal WOB GI: BS hypokinetic, ND, soft, diffuse, TTP, no masses noted, no organomegaly noted MSK: No  e/c/c Skin: No rashes, bruises, ulcerations noted Neuro: A&O to name only. no focal deficits Psyc:pleasantly confused, calm/cooperative  Labs on Admission: I have personally reviewed following labs and imaging studies  CBC: Recent Labs  Lab 01/18/21 1153  WBC 9.2  NEUTROABS 7.9*  HGB 11.0*  HCT 34.6*  MCV 86.9  PLT 762   Basic Metabolic Panel: Recent Labs  Lab 01/18/21 1034  NA 137  K 3.5  CL 101  CO2 23  GLUCOSE 277*  BUN 12  CREATININE 1.11*  CALCIUM 8.3*   GFR: Estimated Creatinine Clearance: 37.6 mL/min (A) (by C-G formula based on SCr of 1.11 mg/dL (H)). Liver Function Tests: Recent Labs  Lab 01/18/21 1034  AST 23  ALT 15  ALKPHOS 125  BILITOT 0.4  PROT 6.9  ALBUMIN 2.8*   Recent Labs  Lab 01/18/21 1034  LIPASE 29   No results for input(s): AMMONIA in the last 168 hours. Coagulation Profile: No results for input(s): INR, PROTIME in the last 168 hours. Cardiac Enzymes: No results for input(s): CKTOTAL, CKMB, CKMBINDEX, TROPONINI in the last 168 hours. BNP (last 3 results) No results for input(s): PROBNP in the last 8760 hours. HbA1C: No results for input(s): HGBA1C in the last 72 hours. CBG: No results for input(s): GLUCAP in the last 168 hours. Lipid Profile: No results for input(s): CHOL, HDL, LDLCALC, TRIG, CHOLHDL, LDLDIRECT in the last 72 hours. Thyroid Function Tests: No results for input(s): TSH, T4TOTAL, FREET4, T3FREE, THYROIDAB in the last 72 hours. Anemia Panel: No results for input(s): VITAMINB12, FOLATE, FERRITIN, TIBC, IRON, RETICCTPCT in the last 72 hours. Urine analysis:    Component Value Date/Time   COLORURINE YELLOW 01/18/2021 1400   APPEARANCEUR TURBID (A) 01/18/2021 1400   LABSPEC 1.015 01/18/2021 1400   PHURINE 5.0 01/18/2021 1400   GLUCOSEU NEGATIVE 01/18/2021 1400   HGBUR MODERATE (A) 01/18/2021 1400   BILIRUBINUR NEGATIVE 01/18/2021 1400   KETONESUR NEGATIVE 01/18/2021 1400   PROTEINUR 30 (A) 01/18/2021 1400    UROBILINOGEN 0.2 05/27/2014 1203   NITRITE NEGATIVE 01/18/2021 1400   LEUKOCYTESUR LARGE (A) 01/18/2021 1400    Radiological Exams on Admission: CT ABDOMEN PELVIS W CONTRAST  Result Date: 01/18/2021 CLINICAL DATA:  Right lower quadrant abdominal pain with significant tenderness  to palpation, vomiting, history cervical cancer, kidney stones, type II diabetes mellitus, hypertension, stroke, appendectomy, former smoker EXAM: CT ABDOMEN AND PELVIS WITH CONTRAST TECHNIQUE: Multidetector CT imaging of the abdomen and pelvis was performed using the standard protocol following bolus administration of intravenous contrast. Sagittal and coronal MPR images reconstructed from axial data set. CONTRAST:  163mL OMNIPAQUE IOHEXOL 300 MG/ML SOLN IV. No oral contrast. COMPARISON:  12/12/2019 FINDINGS: Lower chest: Dependent bibasilar atelectasis, decreased. Hepatobiliary: Gallbladder and liver normal appearance Pancreas: Atrophic pancreas without mass Spleen: Normal appearance.  Tiny splenule posterior to spleen. Adrenals/Urinary Tract: Adrenal glands normal appearance. BILATERAL renal cortical thinning without mass. BILATERAL hydronephrosis and hydroureter with wall thickening and enhancement of the ureters and renal pelves, raising question of urinary tract infection; recommend correlation with urinalysis. No urinary tract calcifications. Bladder grossly unremarkable, portions suboptimally visualized due to beam hardening artifacts in pelvis. Stomach/Bowel: Appendix surgically absent by history. Stomach decompressed. Mildly prominent stool throughout colon. Large and small bowel loops otherwise unremarkable. Vascular/Lymphatic: Atherosclerotic calcifications aorta, iliac arteries, visceral arteries. Aorta normal caliber. No adenopathy. Reproductive: Large fluid attenuation structure with a well-defined rim centrally in the pelvis adjacent to the cranial aspect of the urinary bladder, appears to represent endometrial cavity  of uterus distended by fluid consistent with hydro me truss. Unremarkable adnexa. Other: No free air or free fluid. No hernia or inflammatory process. Musculoskeletal: Osseous demineralization. LEFT hip prosthesis. Scattered degenerative disc and facet disease changes of the spine. IMPRESSION: BILATERAL hydronephrosis and hydroureter with wall thickening and enhancement of the ureters and renal pelves, raising question of urinary tract infection, with no obstructing ureteral calcifications seen; recommend correlation with urinalysis. Large fluid attenuation structure with a well-defined rim in the pelvis adjacent to the cranial aspect of the urinary bladder, appears to represent endometrial cavity of uterus distended by fluid consistent with hydrometros; recommend assessment by pelvic and transvaginal ultrasound. Post appendectomy. Prominent stool in throughout colon. Aortic Atherosclerosis (ICD10-I70.0). Electronically Signed   By: Lavonia Dana M.D.   On: 01/18/2021 12:58   DG Chest Portable 1 View  Result Date: 01/18/2021 CLINICAL DATA:  Fever EXAM: PORTABLE CHEST 1 VIEW COMPARISON:  02/10/2020 FINDINGS: Heart and mediastinal contours are within normal limits. No focal opacities or effusions. No acute bony abnormality. IMPRESSION: Negative. Electronically Signed   By: Rolm Baptise M.D.   On: 01/18/2021 11:07    Assessment/Plan UTI Hydronephrosis     - admit to inpt, med-surg     - place foley per urology rec; continue rocephin, follow UCx, Bld Cx     - fluids  Abdominal pain Constipation     - She's had both diarrhea and fecal impaction per dtr; check c diff, GI PCR     - [facility] onboard: continue BID miralax, hold plavix for possible c-scope  Endometrial mass/collection     - transvaginal US ordered for fluid collection seen on CT a/p     - OBGyn consulted by EDPA; awaiting final recs  Acute metabolic encephalopathy     - likely secondary to UTI; continue UTI treatment  HLD     - continue  home regimen  DM2     - SSI, A1c, dm diet, glucose check  Hypothyroidism     - continue home regimen  AKI     - fluids     - hydronephrosis seen on CT; place foley  Hx of CVA     - on plavix, statin     - hold plavix d/t possible  inpt c-scope   DVT prophylaxis: lovenox  Code Status: FULL  Family Communication: with dtr by phone  Consults called: EDPA spoke with [facility], urology, and is awaiting callback from OBGyn  Status is: Inpatient  Remains inpatient appropriate because:Inpatient level of care appropriate due to severity of illness   Dispo: The patient is from: Home              Anticipated d/c is to: Home              Patient currently is not medically stable to d/c.   Difficult to place patient No  Jonnie Finner DO [facility]  If 7PM-7AM, please contact night-coverage [email or web address]  01/18/2021, 4:16 PM

## 2021-01-18 NOTE — Telephone Encounter (Signed)
Thank you for your help.

## 2021-01-18 NOTE — ED Triage Notes (Signed)
Needed a colonoscopy and got an abd x-ray prior to the scope and it showed a fecal impaction. Has had emesis since yesterday, states it looks like yellow water. Denies abdominal pain. Hx of bowel obstruction in the past, resolved w/o sx. Last BM was 'brown water' on 3/2. Had a fever last night, unsure of her temp.

## 2021-01-18 NOTE — Telephone Encounter (Signed)
FYI

## 2021-01-18 NOTE — Telephone Encounter (Signed)
Thanks for the update

## 2021-01-18 NOTE — Telephone Encounter (Signed)
Thank you for the update. Hopefully she will got to the ED for evaluation.

## 2021-01-18 NOTE — ED Provider Notes (Signed)
[facility] DEPT Provider Note   CSN: [id number] Arrival date & time: 01/18/21  6979     History Chief Complaint  Patient presents with  . Fecal Impaction  . Emesis    EGYPT WELCOME is a 75 y.o. female.  HPI Patient is a 75 year old female with past medical history significant for CVA, memory issues, cervical cancer in remission, pneumonia, DM2 on insulin  40 of history obtained from patient's daughter given that the patient has memory issues-likely vascular dementia given the patient's history of CVA.  Patient has a history of chronic diarrhea seems to be that she has had a history of stool impaction perhaps secondary to her chronic pain medicine oxycodone that she uses for her neuropathy.  She was scheduled for colonoscopy 1 month ago but this had to be aborted given that she had a stool ball present.  She was disimpacted and was planned to have colonoscopy rescheduled however she has had continued severe diarrhea is on MiraLAX and understands the concept of overflow diarrhea.  Patient states that she has had abdominal pain for approximately 1 full month now seems to be worsening however. She had significant worsening in her abdominal pain over the past 2 days with more frequent urination and a fever last night of approximately 101. She was given Tylenol last night and the fever improved. She has not had any return of fever.      Past Medical History:  Diagnosis Date  . Arthritis   . Basal ganglia stroke ([place])   . Bilateral lower extremity edema   . Cancer ([place])   . Carotid artery occlusion   . Flank pain   . Full dentures   . History of cervical cancer    11/ 1994  Stage IB  s/p  high dose radiation brachytherapy @ [facility] 01/ 1995--- per pt no recurrence  . History of chronic bronchitis   . History of hyperthyroidism    d 03/ 2011---due to grave's disease--- s/p  RAI treatment 04/ 2011  . History of kidney stones   . History of sepsis  05/03/2018   due to UTI with klebsiella/ pyelonephritis/ ureteral obstruction cause by stone  . Hyperlipidemia   . Hypothyroidism, postradioiodine therapy    endocrinologist--  dr Loanne Drilling--  dx graves disease and s/p RAI i131 treatement 4/ 2011  . Nauseated   . Oral thrush 05/03/2018  . Pneumonia   . Type 2 diabetes mellitus treated with insulin ([place])    FOLLOWED BY PCP  . Urgency of urination   . Wears glasses     Patient Active Problem List   Diagnosis Date Noted  . UTI (urinary tract infection) 01/18/2021  . Acute toxic encephalopathy 09/30/2020  . Peripheral nerve disease 09/07/2020  . Vitamin D deficiency 09/07/2020  . Carotid artery stenosis, symptomatic, right 03/28/2020  . Preoperative clearance 03/21/2020  . History of COVID-19 02/22/2020  . Carotid artery disease ([place]) 02/22/2020  . History of CVA (cerebrovascular accident) 02/11/2020  . Normocytic anemia 11/29/2019  . History of cervical cancer 03/18/2019  . Essential hypertension 05/04/2018  . Bilateral lower extremity edema 07/31/2017  . Hypothyroidism 06/06/2010  . Type 2 diabetes mellitus ([place]) 10/20/2008  . Hyperlipidemia associated with type 2 diabetes mellitus ([place]) 10/20/2008    Past Surgical History:  Procedure Laterality Date  . APPENDECTOMY    . CAROTID ENDARTERECTOMY Right 03/28/2020  . CATARACT EXTRACTION W/ INTRAOCULAR LENS  IMPLANT, BILATERAL  2004  approx.  . CYSTOSCOPY WITH STENT  PLACEMENT Right 05/04/2018   Procedure: CYSTOSCOPY WITH STENT PLACEMENT AND RETROGRADE PYELOGRAM;  Surgeon: Ceasar Mons, MD;  Location: [facility];  Service: Urology;  Laterality: Right;  . CYSTOSCOPY/URETEROSCOPY/HOLMIUM LASER/STENT PLACEMENT Right 05/19/2018   Procedure: CYSTOSCOPY, URETEROSCOPY/HOLMIUM LASER, STONE BASKETRY/ STENT EXCHANGE;  Surgeon: Ceasar Mons, MD;  [facility]  Service: Urology;  Laterality: Right;  . ENDARTERECTOMY Right 03/28/2020   Procedure: RIGHT  CAROTID ENDARTERECTOMY with PATCH ANGIOPLASTY;  Surgeon: Rosetta Posner, MD;  Location: [facility];  Service: Vascular;  Laterality: Right;  . EXCISION MASS LEFT CHEST WALL  09-20-2007   dr Grandville Silos  [facility]  . Radioactive Iodine Therapy     for thyroid  . REVISION TOTAL HIP ARTHROPLASTY Left early 2000s  . TANDEM RING INSERTION  1995   dr Aldean Ast @ [facility]   EUA w/ tandem placement in ovid  (for direct high dose radiation brachytherapy , cervical cancer)  . TOTAL HIP ARTHROPLASTY Left 1990s  . TRANSTHORACIC ECHOCARDIOGRAM  08/07/2017   mild focal basal hypertrophy of the septum,  ef 03-47%, grade 1 diastolic dysfunction/  atrial septum with lipomatous hypertrophy/  trivial PR     OB History   No obstetric history on file.     Family History  Problem Relation Age of Onset  . Emphysema Father   . Diabetes Father   . Emphysema Sister   . Emphysema Brother   . Cancer Mother        Skin Cancer  . Diabetes Mother   . Colon cancer Neg Hx   . Esophageal cancer Neg Hx   . Rectal cancer Neg Hx   . Stomach cancer Neg Hx     Social History   Tobacco Use  . Smoking status: Former Smoker    Years: 5.00    Types: Cigarettes    Quit date: 05/13/1982    Years since quitting: 38.7  . Smokeless tobacco: Never Used  Vaping Use  . Vaping Use: Never used  Substance Use Topics  . Alcohol use: No  . Drug use: No    Home Medications Prior to Admission medications   Medication Sig Start Date End Date Taking? Authorizing Provider  atorvastatin (LIPITOR) 40 MG tablet Take 1 tablet (40 mg total) by mouth daily at 6 PM. Patient taking differently: Take 40 mg by mouth at bedtime. 12/15/19  Yes Hosie Poisson, MD  baclofen (LIORESAL) 10 MG tablet Take 10 mg by mouth 4 (four) times daily as needed for muscle spasms. 12/11/20  Yes [provider]  cephALEXin (KEFLEX) 250 MG capsule Take 250 mg by mouth daily.    Yes [provider]  clopidogrel (PLAVIX) 75 MG tablet Take 1 tablet (75 mg  total) by mouth daily. 12/16/19  Yes Hosie Poisson, MD  ergocalciferol (VITAMIN D2) 50000 UNITS capsule Take 50,000 Units by mouth every Sunday.    Yes [provider]  gemfibrozil (LOPID) 600 MG tablet Take 600 mg by mouth 2 (two) times daily.   Yes [provider]  insulin degludec (TRESIBA FLEXTOUCH) 100 UNIT/ML FlexTouch Pen Inject 30 Units into the skin at bedtime. Patient taking differently: Inject 40 Units into the skin at bedtime. 10/01/20  Yes Sanjuan Dame, MD  levothyroxine (SYNTHROID) 100 MCG tablet Take 1 tablet (100 mcg total) by mouth daily before breakfast. Patient taking differently: Take 100 mcg by mouth once a week. 12/16/19  Yes Hosie Poisson, MD  metFORMIN (GLUCOPHAGE) 500 MG tablet Take 500 mg by mouth 2 (  two) times daily with a meal.   Yes [provider]  methocarbamol (ROBAXIN) 500 MG tablet Take 500 mg by mouth 2 (two) times daily as needed for muscle spasms.   Yes [provider]  ondansetron (ZOFRAN-ODT) 8 MG disintegrating tablet Take 8 mg by mouth 3 (three) times daily. 12/18/20  Yes [provider]  oxybutynin (DITROPAN-XL) 10 MG 24 hr tablet Take 10 mg by mouth daily. 01/13/21  Yes [provider]  oxyCODONE ER (XTAMPZA ER) 9 MG C12A Take 9 mg by mouth every 12 (twelve) hours.   Yes [provider]  pantoprazole (PROTONIX) 40 MG tablet Take 1 tablet (40 mg total) by mouth daily. 01/11/21  Yes Thornton Park, MD  triamcinolone cream (KENALOG) 0.1 % Apply 1 application topically daily as needed (vaginal itching). Mix with nystatin cream 09/19/20  Yes [provider]  aspirin EC 81 MG EC tablet Take 1 tablet (81 mg total) by mouth daily. Patient not taking: No sig reported 12/16/19   Hosie Poisson, MD  bismuth subsalicylate (PEPTO BISMOL) 262 MG/15ML suspension Please take 2 teaspoons by mouth TID Patient not taking: No sig reported 12/19/20   Thornton Park, MD    Allergies    Codeine  Review  of Systems   Review of Systems  Constitutional: Positive for fever. Negative for chills.  HENT: Negative for congestion.   Eyes: Negative for pain.  Respiratory: Negative for cough and shortness of breath.   Cardiovascular: Negative for chest pain and leg swelling.  Gastrointestinal: Positive for abdominal pain, constipation, diarrhea, nausea and vomiting.  Genitourinary: Negative for dysuria.  Musculoskeletal: Negative for myalgias.  Skin: Negative for rash.  Neurological: Negative for dizziness and headaches.    Physical Exam Updated Vital Signs BP (!) 147/57   Pulse 83   Temp 99.3 F (37.4 C) (Oral)   Resp 12   SpO2 97%   Physical Exam Vitals and nursing note reviewed.  Constitutional:      General: She is not in acute distress. HENT:     Head: Normocephalic and atraumatic.     Nose: Nose normal.  Eyes:     General: No scleral icterus. Cardiovascular:     Rate and Rhythm: Normal rate and regular rhythm.     Pulses: Normal pulses.     Heart sounds: Normal heart sounds.  Pulmonary:     Effort: Pulmonary effort is normal. No respiratory distress.     Breath sounds: No wheezing.  Abdominal:     Palpations: Abdomen is soft.     Tenderness: There is abdominal tenderness. There is no right CVA tenderness, left CVA tenderness or guarding.     Comments: Abdomen is soft but tender to even light palpation of the lower abdomen most significantly in the right lower quadrant and left lower quadrant. She does have some rebound tenderness.  No guarding.  Genitourinary:    Comments: Rectal exam with tight rectal vault w minimal space No palpable stool. Brown stool found on DRE.  Musculoskeletal:     Cervical back: Normal range of motion.     Right lower leg: No edema.     Left lower leg: No edema.  Skin:    General: Skin is warm and dry.     Capillary Refill: Capillary refill takes less than 2 seconds.  Neurological:     Mental Status: She is alert. Mental status is at  baseline.  Psychiatric:        Mood and Affect: Mood normal.  Behavior: Behavior normal.     ED Results / Procedures / Treatments   Labs (all labs ordered are listed, but only abnormal results are displayed) Labs Reviewed  COMPREHENSIVE METABOLIC PANEL - Abnormal; Notable for the following components:      Result Value   Glucose, Bld 277 (*)    Creatinine, Ser 1.11 (*)    Calcium 8.3 (*)    Albumin 2.8 (*)    GFR, Estimated 52 (*)    All other components within normal limits  URINALYSIS, ROUTINE W REFLEX MICROSCOPIC - Abnormal; Notable for the following components:   APPearance TURBID (*)    Hgb urine dipstick MODERATE (*)    Protein, ur 30 (*)    Leukocytes,Ua LARGE (*)    WBC, UA >50 (*)    Bacteria, UA MANY (*)    All other components within normal limits  CBC WITH DIFFERENTIAL/PLATELET - Abnormal; Notable for the following components:   Hemoglobin 11.0 (*)    HCT 34.6 (*)    Neutro Abs 7.9 (*)    All other components within normal limits  POC OCCULT BLOOD, ED - Abnormal; Notable for the following components:   Fecal Occult Bld POSITIVE (*)    All other components within normal limits  URINE CULTURE  SARS CORONAVIRUS 2 (TAT 6-24 HRS)  CULTURE, BLOOD (ROUTINE X 2)  CULTURE, BLOOD (ROUTINE X 2)  C DIFFICILE QUICK SCREEN W PCR REFLEX  GASTROINTESTINAL PANEL BY PCR, STOOL (REPLACES STOOL CULTURE)  LIPASE, BLOOD  CBC WITH DIFFERENTIAL/PLATELET  CBC  COMPREHENSIVE METABOLIC PANEL    EKG None  Radiology US Transvaginal Non-OB  Result Date: 01/18/2021 CLINICAL DATA:  Pelvic pain, history cervical cancer, abnormal CT exam demonstrating fluid within uterus EXAM: TRANSABDOMINAL AND TRANSVAGINAL ULTRASOUND OF PELVIS TECHNIQUE: Both transabdominal and transvaginal ultrasound examinations of the pelvis were performed. Transabdominal technique was performed for global imaging of the pelvis including uterus, ovaries, adnexal regions, and pelvic cul-de-sac. It was  necessary to proceed with endovaginal exam following the transabdominal exam to visualize the uterus, endometrium, and ovaries. COMPARISON:  CT abdomen and pelvis 02/18/2021 FINDINGS: Uterus Measurements: 10.4 x 6.0 x 7.7 cm = volume: 250 mL. Anteverted. Thinned myometrium. No focal mass. Endometrium Thickness: 50 mm. Endometrial canal markedly distended by complex heterogeneous fluid question blood. No discrete mass visualized Right ovary Not visualized, likely obscured by bowel Left ovary Not visualized, likely obscured by bowel Other findings Significant debris within urinary bladder, which demonstrates slightly irregular wall. No other pelvic masses. No free pelvic fluid. IMPRESSION: Progressive distension of the endometrial canal by complex fluid likely blood with thinning of myometrium. This suggests cervical stenosis, which could be due to benign stricture or mass. Nonvisualization of ovaries. Significant debris within the urinary bladder with slightly irregular bladder wall thickening, could potentially be represent cystitis or hemorrhage though this can also be seen with other etiologies including tumor, recommend correlation with urinalysis. Electronically Signed   By: Lavonia Dana M.D.   On: 01/18/2021 16:55   US Pelvis Complete  Result Date: 01/18/2021 CLINICAL DATA:  Pelvic pain, history cervical cancer, abnormal CT exam demonstrating fluid within uterus EXAM: TRANSABDOMINAL AND TRANSVAGINAL ULTRASOUND OF PELVIS TECHNIQUE: Both transabdominal and transvaginal ultrasound examinations of the pelvis were performed. Transabdominal technique was performed for global imaging of the pelvis including uterus, ovaries, adnexal regions, and pelvic cul-de-sac. It was necessary to proceed with endovaginal exam following the transabdominal exam to visualize the uterus, endometrium, and ovaries. COMPARISON:  CT abdomen and pelvis  02/18/2021 FINDINGS: Uterus Measurements: 10.4 x 6.0 x 7.7 cm = volume: 250 mL.  Anteverted. Thinned myometrium. No focal mass. Endometrium Thickness: 50 mm. Endometrial canal markedly distended by complex heterogeneous fluid question blood. No discrete mass visualized Right ovary Not visualized, likely obscured by bowel Left ovary Not visualized, likely obscured by bowel Other findings Significant debris within urinary bladder, which demonstrates slightly irregular wall. No other pelvic masses. No free pelvic fluid. IMPRESSION: Progressive distension of the endometrial canal by complex fluid likely blood with thinning of myometrium. This suggests cervical stenosis, which could be due to benign stricture or mass. Nonvisualization of ovaries. Significant debris within the urinary bladder with slightly irregular bladder wall thickening, could potentially be represent cystitis or hemorrhage though this can also be seen with other etiologies including tumor, recommend correlation with urinalysis. Electronically Signed   By: Lavonia Dana M.D.   On: 01/18/2021 16:55   CT ABDOMEN PELVIS W CONTRAST  Result Date: 01/18/2021 CLINICAL DATA:  Right lower quadrant abdominal pain with significant tenderness to palpation, vomiting, history cervical cancer, kidney stones, type II diabetes mellitus, hypertension, stroke, appendectomy, former smoker EXAM: CT ABDOMEN AND PELVIS WITH CONTRAST TECHNIQUE: Multidetector CT imaging of the abdomen and pelvis was performed using the standard protocol following bolus administration of intravenous contrast. Sagittal and coronal MPR images reconstructed from axial data set. CONTRAST:  166mL OMNIPAQUE IOHEXOL 300 MG/ML SOLN IV. No oral contrast. COMPARISON:  12/12/2019 FINDINGS: Lower chest: Dependent bibasilar atelectasis, decreased. Hepatobiliary: Gallbladder and liver normal appearance Pancreas: Atrophic pancreas without mass Spleen: Normal appearance.  Tiny splenule posterior to spleen. Adrenals/Urinary Tract: Adrenal glands normal appearance. BILATERAL renal cortical  thinning without mass. BILATERAL hydronephrosis and hydroureter with wall thickening and enhancement of the ureters and renal pelves, raising question of urinary tract infection; recommend correlation with urinalysis. No urinary tract calcifications. Bladder grossly unremarkable, portions suboptimally visualized due to beam hardening artifacts in pelvis. Stomach/Bowel: Appendix surgically absent by history. Stomach decompressed. Mildly prominent stool throughout colon. Large and small bowel loops otherwise unremarkable. Vascular/Lymphatic: Atherosclerotic calcifications aorta, iliac arteries, visceral arteries. Aorta normal caliber. No adenopathy. Reproductive: Large fluid attenuation structure with a well-defined rim centrally in the pelvis adjacent to the cranial aspect of the urinary bladder, appears to represent endometrial cavity of uterus distended by fluid consistent with hydro me truss. Unremarkable adnexa. Other: No free air or free fluid. No hernia or inflammatory process. Musculoskeletal: Osseous demineralization. LEFT hip prosthesis. Scattered degenerative disc and facet disease changes of the spine. IMPRESSION: BILATERAL hydronephrosis and hydroureter with wall thickening and enhancement of the ureters and renal pelves, raising question of urinary tract infection, with no obstructing ureteral calcifications seen; recommend correlation with urinalysis. Large fluid attenuation structure with a well-defined rim in the pelvis adjacent to the cranial aspect of the urinary bladder, appears to represent endometrial cavity of uterus distended by fluid consistent with hydrometros; recommend assessment by pelvic and transvaginal ultrasound. Post appendectomy. Prominent stool in throughout colon. Aortic Atherosclerosis (ICD10-I70.0). Electronically Signed   By: Lavonia Dana M.D.   On: 01/18/2021 12:58   DG Chest Portable 1 View  Result Date: 01/18/2021 CLINICAL DATA:  Fever EXAM: PORTABLE CHEST 1 VIEW  COMPARISON:  02/10/2020 FINDINGS: Heart and mediastinal contours are within normal limits. No focal opacities or effusions. No acute bony abnormality. IMPRESSION: Negative. Electronically Signed   By: Rolm Baptise M.D.   On: 01/18/2021 11:07    Procedures .Critical Care Performed by: Tedd Sias, PA Authorized by: Tedd Sias,  PA   Critical care provider statement:    Critical care time (minutes):  35   Critical care time was exclusive of:  Separately billable procedures and treating other patients and teaching time   Critical care was necessary to treat or prevent imminent or life-threatening deterioration of the following conditions: infection, compressive mass in pelvis.   Critical care was time spent personally by me on the following activities:  Discussions with consultants, evaluation of patient's response to treatment, examination of patient, review of old charts, re-evaluation of patient's condition, pulse oximetry, ordering and review of radiographic studies, ordering and review of laboratory studies and ordering and performing treatments and interventions   I assumed direction of critical care for this patient from another provider in my specialty: no       Medications Ordered in ED Medications  sodium chloride 0.9 % bolus 1,000 mL (0 mLs Intravenous Stopped 01/18/21 1353)  morphine 4 MG/ML injection 4 mg (4 mg Intravenous Given 01/18/21 1137)  ondansetron (ZOFRAN) injection 4 mg (4 mg Intravenous Given 01/18/21 1135)  iohexol (OMNIPAQUE) 300 MG/ML solution 100 mL (100 mLs Intravenous Contrast Given 01/18/21 1200)  morphine 4 MG/ML injection 4 mg (4 mg Intravenous Given 01/18/21 1515)  cefTRIAXone (ROCEPHIN) 1 g in sodium chloride 0.9 % 100 mL IVPB (0 g Intravenous Stopped 01/18/21 1630)    ED Course  I have reviewed the triage vital signs and the nursing notes.  Pertinent labs & imaging results that were available during my care of the patient were reviewed by me and considered  in my medical decision making (see chart for details).  Patient is 75 year old female with a past medical history detailed in HPI presented today for severe lower abdominal pain.  She is notably tender on my examination of the lower abdomen. Does not have some urinary frequency.  Given that there is difficulty getting urine sample from patient will authorize RN staff to and out catheterize patient.  See PE for full details rectal exam notably abnormal.  Patient has CBC without leukocytosis and no significant anemia.  CMP without any significant electrolyte derangements.  Blood sugar is somewhat elevated.  Likely chronic.  Urinalysis shows evidence of infection with 20 bacteria WBCs present. Urine culture pending.  I discussed this case with my attending physician who cosigned this note including patient's presenting symptoms, physical exam, and planned diagnostics and interventions. Attending physician stated agreement with plan or made changes to plan which were implemented.   Attending physician assessed patient at bedside.  Clinical Course as of 01/18/21 9983  Ludwig Clarks Jan 18, 2021  1043 I was directly involved in this patients medical care.  [JA]  2505 [id number] with CT tachnician who states the PO contrast used in our studies is rarely helpful for inducing BM Ultimately CT scan eval for stercolitis and perf.  [WF]  3790 I was directly involved in this patients medical care.  [WI]  0973 Discussed case with on-call GI who assessed patient at bedside..  I believe any of her symptoms are related to stool obstruction that she has on CT scan.  On the rectal examination the noted that she has a firm anterior mass.  This is likely what was reflected on CT imaging.  Will discuss with urology, OB and hospitalist.  Patient initiated on Rocephin and admitted to medicine.  [WF]  1615 Discussed with urology who recommends foley, abx and admit. They will see pt during hosp course. Monitor at this time.  [WF]   1619  Discussed with DR. Marylyn Ishihara of hosp who will admit [WF]    Clinical Course User Index [JH] [place], Greggory Brandy, MD [WF] Tedd Sias, [place]   Pelvic US discussed with OB/GYN IMPRESSION:  Progressive distension of the endometrial canal by complex fluid  likely blood with thinning of myometrium.    This suggests cervical stenosis, which could be due to benign  stricture or mass.    Nonvisualization of ovaries.    Significant debris within the urinary bladder with slightly  irregular bladder wall thickening, could potentially be represent  cystitis or hemorrhage though this can also be seen with other  etiologies including tumor, recommend correlation with urinalysis.       MDM Rules/Calculators/A&P                          Patient admitted to hospitalist Dr. Marylyn Ishihara.  OB/GYN consulted and they recommend GYN oncology be consulted during hospital stay. Patient and family member at bedside aware of plan to admit. Agreeable to plan.  Final Clinical Impression(s) / ED Diagnoses Final diagnoses:  Acute cystitis with hematuria  Lower abdominal pain  Non-intractable vomiting with nausea, unspecified vomiting type    Rx / DC Orders ED Discharge Orders    None       Tedd Sias, [place] 01/18/21 1823    Luna Fuse, MD 01/19/21 1112

## 2021-01-18 NOTE — ED Notes (Signed)
Attempted to call report to [facility], spoke with charge nurse, RN receiving pt was unavailable at this time but this RN will call back in a few minutes

## 2021-01-18 NOTE — Telephone Encounter (Signed)
Pt's daughter Joelene Millin is wanting to inform the provider that she is going to the ED

## 2021-01-18 NOTE — Telephone Encounter (Signed)
Pt daughter called to request further explanation re: phone call about abd xray. Advised pt has been informed by both [facility] and by our office to walk in to have her abd xray completed. To date, pt has not yet completed this exam. Call was placed to pt to encourage to please follow POT provided by Dr. Tarri Glenn and to proceed to have the exam completed. Daughter proceeded to state, "well she ain't getting any better.", "she is still having tremendous pain, running a fever of 101.3.", "we called the on call doctor and he said take her to the ED." Advised, given pt ongoing c/o pain and elevated fever, as well as failure to follow POT, advice will be for pt to proceed to ED for further evaluation and treatment of her concerns. Daughter states, "well she ain't going to go." Advised, so long as pt is of sound mind and can make decisions on her own, pt does have the right to refuse. However, family can absolutely encourage pt to follow professional advice being provided which will result in answers to their concerns and ultimately aid in pt feel better. Daughter verbalized acceptance and understanding and states she is on her way to the pt house to reinforce or advice. Routing this message to Dr. Tarri Glenn to make her aware.

## 2021-01-19 DIAGNOSIS — R1084 Generalized abdominal pain: Secondary | ICD-10-CM | POA: Diagnosis not present

## 2021-01-19 DIAGNOSIS — R1030 Lower abdominal pain, unspecified: Secondary | ICD-10-CM | POA: Diagnosis not present

## 2021-01-19 DIAGNOSIS — D509 Iron deficiency anemia, unspecified: Secondary | ICD-10-CM | POA: Diagnosis not present

## 2021-01-19 DIAGNOSIS — N3001 Acute cystitis with hematuria: Secondary | ICD-10-CM

## 2021-01-19 DIAGNOSIS — K5909 Other constipation: Secondary | ICD-10-CM | POA: Diagnosis not present

## 2021-01-19 LAB — COMPREHENSIVE METABOLIC PANEL
ALT: 11 U/L (ref 0–44)
AST: 15 U/L (ref 15–41)
Albumin: 2.1 g/dL — ABNORMAL LOW (ref 3.5–5.0)
Alkaline Phosphatase: 83 U/L (ref 38–126)
Anion gap: 7 (ref 5–15)
BUN: 12 mg/dL (ref 8–23)
CO2: 27 mmol/L (ref 22–32)
Calcium: 7.7 mg/dL — ABNORMAL LOW (ref 8.9–10.3)
Chloride: 110 mmol/L (ref 98–111)
Creatinine, Ser: 1.08 mg/dL — ABNORMAL HIGH (ref 0.44–1.00)
GFR, Estimated: 54 mL/min — ABNORMAL LOW (ref >60)
Glucose, Bld: 106 mg/dL — ABNORMAL HIGH (ref 70–99)
Potassium: 3 mmol/L — ABNORMAL LOW (ref 3.5–5.1)
Sodium: 144 mmol/L (ref 135–145)
Total Bilirubin: 0.5 mg/dL (ref 0.3–1.2)
Total Protein: 5.6 g/dL — ABNORMAL LOW (ref 6.5–8.1)

## 2021-01-19 LAB — CBC
HCT: 31.8 % — ABNORMAL LOW (ref 36.0–46.0)
Hemoglobin: 10.1 g/dL — ABNORMAL LOW (ref 12.0–15.0)
MCH: 27.9 pg (ref 26.0–34.0)
MCHC: 31.8 g/dL (ref 30.0–36.0)
MCV: 87.8 fL (ref 80.0–100.0)
Platelets: 228 10*3/uL (ref 150–400)
RBC: 3.62 MIL/uL — ABNORMAL LOW (ref 3.87–5.11)
RDW: 13.1 % (ref 11.5–15.5)
WBC: 8.2 10*3/uL (ref 4.0–10.5)
nRBC: 0 % (ref 0.0–0.2)

## 2021-01-19 LAB — MAGNESIUM: Magnesium: 1.2 mg/dL — ABNORMAL LOW (ref 1.7–2.4)

## 2021-01-19 LAB — GLUCOSE, CAPILLARY
Glucose-Capillary: 85 mg/dL (ref 70–99)
Glucose-Capillary: 166 mg/dL — ABNORMAL HIGH (ref 70–99)
Glucose-Capillary: 134 mg/dL — ABNORMAL HIGH (ref 70–99)
Glucose-Capillary: 163 mg/dL — ABNORMAL HIGH (ref 70–99)

## 2021-01-19 LAB — SARS CORONAVIRUS 2 (TAT 6-24 HRS): SARS Coronavirus 2: NEGATIVE

## 2021-01-19 MED ORDER — POTASSIUM CHLORIDE CRYS ER 20 MEQ PO TBCR
40.0000 meq | EXTENDED_RELEASE_TABLET | Freq: Once | ORAL | Status: AC
  Administered 2021-01-19: 40 meq via ORAL
  Filled 2021-01-19: qty 2

## 2021-01-19 MED ORDER — BISACODYL 5 MG PO TBEC: 10.0000 mg | DELAYED_RELEASE_TABLET | Freq: Two times a day (BID) | ORAL | Status: DC

## 2021-01-19 MED ORDER — CHLORHEXIDINE GLUCONATE CLOTH 2 % EX PADS
6.0000 | MEDICATED_PAD | Freq: Every day | CUTANEOUS | Status: DC
  Administered 2021-01-19 – 2021-01-22 (×4): 6 via TOPICAL

## 2021-01-19 MED ORDER — BISACODYL 10 MG RE SUPP
10.0000 mg | Freq: Once | RECTAL | Status: AC
  Administered 2021-01-19: 10 mg via RECTAL
  Filled 2021-01-19: qty 1

## 2021-01-19 MED ORDER — POLYETHYLENE GLYCOL 3350 17 G PO PACK
34.0000 g | PACK | Freq: Two times a day (BID) | ORAL | Status: DC
  Administered 2021-01-19 – 2021-01-21 (×4): 34 g via ORAL
  Filled 2021-01-19 (×4): qty 2

## 2021-01-19 MED ORDER — BISACODYL 5 MG PO TBEC
10.0000 mg | DELAYED_RELEASE_TABLET | Freq: Two times a day (BID) | ORAL | Status: AC
  Administered 2021-01-19 – 2021-01-20 (×4): 10 mg via ORAL
  Filled 2021-01-19 (×4): qty 2

## 2021-01-19 NOTE — Progress Notes (Addendum)
[facility] Progress Note  Chief Complaint: Generalized abdominal pain, constipation  History:  [name] was seen with her daughter at the bedside.  She appears much improved from when I saw her in the ED yesterday.  She is alert and conversational, no longer looks systemically ill.  She complains of generalized abdominal pain that is predominantly lower and fairly constant.  They do not feel that her bowels have yet been moving well, and Dulcolax was prescribed by the [facility] physician in addition to the MiraLAX we have prescribed yesterday.  She had a regular diet earlier today, did not cause nausea vomiting or increasing abdominal pain.  ROS: Cardiovascular: No chest pain Respiratory: No dyspnea Urinary: Discomfort from urinary catheter  Objective:   Current Facility-Administered Medications:  .  0.9 %  sodium chloride infusion, , Intravenous, Continuous, Kyle, Tyrone A, DO, Last Rate: 75 mL/hr at 01/18/21 2119, New Bag at 01/18/21 2119 .  acetaminophen (TYLENOL) tablet 650 mg, 650 mg, Oral, Q6H PRN **OR** acetaminophen (TYLENOL) suppository 650 mg, 650 mg, Rectal, Q6H PRN, Marylyn Ishihara, Tyrone A, DO .  atorvastatin (LIPITOR) tablet 40 mg, 40 mg, Oral, QHS, Kyle, Tyrone A, DO, 40 mg at 01/18/21 2123 .  bisacodyl (DULCOLAX) EC tablet 10 mg, 10 mg, Oral, BID, Georgette Shell, MD .  bisacodyl (DULCOLAX) suppository 10 mg, 10 mg, Rectal, Once, Georgette Shell, MD .  cefTRIAXone (ROCEPHIN) 1 g in sodium chloride 0.9 % 100 mL IVPB, 1 g, Intravenous, Q24H, Kyle, Tyrone A, DO .  Chlorhexidine Gluconate Cloth 2 % PADS 6 each, 6 each, Topical, Daily, Kyle, Tyrone A, DO .  enoxaparin (LOVENOX) injection 40 mg, 40 mg, Subcutaneous, Q24H, Kyle, Tyrone A, DO, 40 mg at 01/18/21 2123 .  gemfibrozil (LOPID) tablet 600 mg, 600 mg, Oral, BID, Kyle, Tyrone A, DO, 600 mg at 01/18/21 2124 .  insulin aspart (novoLOG) injection 0-15 Units, 0-15 Units, Subcutaneous, TID WC, Kyle, Tyrone A, DO .  insulin  aspart (novoLOG) injection 0-5 Units, 0-5 Units, Subcutaneous, QHS, Kyle, Tyrone A, DO .  insulin glargine (LANTUS) injection 20 Units, 20 Units, Subcutaneous, QHS, Kyle, Tyrone A, DO, 20 Units at 01/18/21 2124 .  [START ON 01/21/2021] levothyroxine (SYNTHROID) tablet 100 mcg, 100 mcg, Oral, Weekly, Kyle, Tyrone A, DO .  morphine 4 MG/ML injection 4 mg, 4 mg, Intravenous, Q4H PRN, Marylyn Ishihara, Tyrone A, DO .  ondansetron (ZOFRAN) tablet 4 mg, 4 mg, Oral, Q6H PRN **OR** ondansetron (ZOFRAN) injection 4 mg, 4 mg, Intravenous, Q6H PRN, Marylyn Ishihara, Tyrone A, DO .  oxybutynin (DITROPAN-XL) 24 hr tablet 10 mg, 10 mg, Oral, Daily, Kyle, Tyrone A, DO, 10 mg at 01/18/21 2124 .  polyethylene glycol (MIRALAX / GLYCOLAX) packet 17 g, 17 g, Oral, BID, Kyle, Tyrone A, DO, 17 g at 01/18/21 2124  . sodium chloride 75 mL/hr at 01/18/21 2119  . cefTRIAXone (ROCEPHIN)  IV       Vital signs in last 24 hrs: Vitals:   01/19/21 0222 01/19/21 0607  BP: (!) 136/45 (!) 132/51  Pulse: 65 68  Resp: 16 16  Temp: 98.8 F (37.1 C) 98.6 F (37 C)  SpO2: 97% 99%    Intake/Output Summary (Last 24 hours) at 01/19/2021 1009 Last data filed at 01/19/2021 0900 Gross per 24 hour  Intake 1240 ml  Output 2800 ml  Net -1560 ml   Urine output is good   Physical Exam  Chronically ill-appearing elderly woman  HEENT: sclera anicteric, oral mucosa without lesions  Neck: supple, no  thyromegaly, JVD or lymphadenopathy  Cardiac: RRR without murmurs, S1S2 heard, no peripheral edema  Pulm: clear to auscultation bilaterally, normal RR and effort noted  Abdomen: soft, midline suprapubic tenderness, with active bowel sounds. No guarding or palpable hepatosplenomegaly  Skin; warm and dry, no jaundice  Recent Labs:  CBC Latest Ref Rng & Units 01/19/2021 01/18/2021 10/01/2020  WBC 4.0 - 10.5 K/uL 8.2 9.2 7.3  Hemoglobin 12.0 - 15.0 g/dL 10.1(L) 11.0(L) 10.9(L)  Hematocrit 36.0 - 46.0 % 31.8(L) 34.6(L) 34.6(L)  Platelets 150 - 400 K/uL 228  238 228    No results for input(s): INR in the last 168 hours. CMP Latest Ref Rng & Units 01/19/2021 01/18/2021 10/01/2020  Glucose 70 - 99 mg/dL 106(H) 277(H) 251(H)  BUN 8 - 23 mg/dL 12 12 11   Creatinine 0.44 - 1.00 mg/dL 1.08(H) 1.11(H) 0.80  Sodium 135 - 145 mmol/L 144 137 139  Potassium 3.5 - 5.1 mmol/L 3.0(L) 3.5 3.3(L)  Chloride 98 - 111 mmol/L 110 101 103  CO2 22 - 32 mmol/L 27 23 26   Calcium 8.9 - 10.3 mg/dL 7.7(L) 8.3(L) 8.6(L)  Total Protein 6.5 - 8.1 g/dL 5.6(L) 6.9 5.9(L)  Total Bilirubin 0.3 - 1.2 mg/dL 0.5 0.4 0.6  Alkaline Phos 38 - 126 U/L 83 125 99  AST 15 - 41 U/L 15 23 14(L)  ALT 0 - 44 U/L 11 15 11      Urology consult note reviewed and appreciated.  Gynecology evaluation recommended, consult not yet obtained.  Assessment & Plan  Assessment:  Generalized but predominantly lower abdominal pain from urinary obstruction and chronic pelvic fluid collection.  Chronic constipation is contributing but I think is the least significant contributing factor to the pain.  Chronic severe constipation.  Generalized slow transit constipation, chronic opioid use, mild extrinsic compression sigmoid colon from the pelvic fluid collection seen on imaging, but not causing proximal colonic dilatation.  Possible poor anorectal motility related to dense pelvic fibrosis from prior radiation treatment.  Chronic iron deficiency anemia, colonoscopy has not yet been technically feasible due to difficulties with bowel preparation in the outpatient setting.  We will attempt to do that procedure while she is admitted, assuming her infectious process remains under control and she is otherwise clinically stable for that. It will require a few days of progressive bowel preparation for that to be feasible.  Please hold the Plavix before then.   Plan: I have increased the MiraLAX to 34 g twice a day, and changed the order so it is in a large volume of water.  I changed to a low residue diet for  today and probably tomorrow, which should hopefully make eventual bowel preparation easier.   Nelida Meuse III Office: [phone]

## 2021-01-19 NOTE — Progress Notes (Addendum)
GYN Note 01/19/2021 1328  I was called by the hospitalist service re: this patient. They state they spoke to GYN yesterday and that person recommended an u/s, but they are unable to give me the name of the person they spoke, too. I told them that I can see her tomorrow unless it's an urgent issue and they said that was fine to see tomorrow but if I could look at her chart and leave my initial impressions.  In looking in her chart and in care everywhere, it looks like she had bulk 1b cervical cancer that was treated at [facility] in early 1995 based off of operative notes, where radiotherapy treatments were done, that are only available as well as imaging and pathology; no office notes are available.   Based on her current imagining (CT and u/s), I am concerned about cervical or uterine cancer, which could be new or a consequence of her prior XRT. Her uterus isn't of a size that should cause bilateral hydro (10.4 x 6 x 7.7cm on u/s) but either a new malignancy and/or scarring from her prior pelvic RT could certainly cause this.   I will come and see her tomorrow and discuss this with her and get her set up to see GYN Oncology.  In the interim, defer to urology in re: hydronephrosis; it looks like a foley catheter has been placed and plan for rpt renal u/s tomorrow  Durene Romans MD Attending [facility] [facility] Phone: [phone] (M-F, 0800-1700) & [phone] (Off hours, weekends, holidays)

## 2021-01-19 NOTE — Plan of Care (Signed)
  Problem: Urinary Elimination: Goal: Signs and symptoms of infection will decrease Outcome: Progressing   

## 2021-01-19 NOTE — Progress Notes (Addendum)
PROGRESS NOTE    [name]  ZDG:[id number] DOB: [birth year] DOA: 01/18/2021 PCP: Everardo Beals, NP  Brief Narrative: [name] is a 75 y.o. female with medical history significant of cervical cancer, DM2, HTN, renal stones, chronic abdominal pain. Presenting with abdominal pain. History per daughter as patient is confused. Daughter reports that patient has had abdominal pain for several months. She has been followed by [facility] and has had c-scope and EGD performed. Her C-scope was not able to be completed due to fecal impaction. She was scheduled for a follow up c-scope next week. Her LLQ abdominal pain has been worsening over the last few days. This prompted a call to [facility] and that is when the c-scope was moved to next week. Last night, her daughter found the pain febrile with chills. Tempw as up to 103. She was nauseous and had some vomiting. The daughter called [facility] and it was recommended that the patient come to the ED. However, the patient refused. When the patient woke this morning, her symptoms continued and now included confusion. The family brought her to the ED for help. They deny any other aggravating or alleviating factors.   ED Course: CT found a large amount of stool in the colon. It also found a fluid collection in the pelvic cavity. This collection looks like it's causing hydronephrosis. Urology was consulted. Per EDPA, recommended foley and abx for UTI. [facility] was consulted. OBGyn was consulted. Transvaginal US ordered. [facility] called for admission.   Assessment & Plan:   Active Problems:   UTI (urinary tract infection)    #1 Bilateral hydronephrosis? Etiology. seen by urology. Possible bilateral ureteral obstruction versus bladder outlet obstruction. Discussed with GYN who does not think this is caused by external compression of the endometrial fluid collection/massFoley has been placed continue. Check renal ultrasound in a.m to see if hydronephrosis has resolved.   #2  constipation/diarhea-patient has chronic constipation. GI following and has made changes to her stool softeners.  #3 endometrial fluid collection discussed with Dr. Ilda Basset who will see the patient tomorrow.  #4 type 2 diabetes continue SSI and Lantus. CBG (last 3)  Recent Labs    01/18/21 2027 01/19/21 0752 01/19/21 1154  GLUCAP 119* 85 166*     #5 AKI creatinine 1.08 down from 1.11. Her baseline creatinine is around 0.8.  #6 hypothyroidism continue Synthroid  #7 hyperlipidemia continue statins  #8 history of stroke on statin and Plavix. Continue to hold Plavix for preparation for colonoscopy.  #9 iron deficiency anemia GI planning colonoscopy this admit.  #10 hypokalemia replete and recheck labs in a.m.  #11 UTI urine culture pending UA consistent with UTI continue Rocephin  Estimated body mass index is 28.32 kg/m as calculated from the following:   Height as of 01/15/21: 5' (1.524 m).   Weight as of 01/15/21: 65.8 kg.  DVT prophylaxis: Lovenox  code Status: Full code Family Communication: Discussed with daughter Disposition Plan:  Status is: Inpatient  Dispo: The patient is from: Home              Anticipated d/c is to: Home              Patient currently is not medically stable to d/c.   Difficult to place patient na    Consultants: Gynecology, GI, urology  Procedures: None Antimicrobials: Rocephin Subjective:  Patient resting in bed she is awake alert  Answering all questions appropriately and following commands. Objective: Vitals:   01/18/21 1854 01/18/21 2242 01/19/21  0222 01/19/21 0607  BP: (!) 144/48 (!) 118/48 (!) 136/45 (!) 132/51  Pulse: 76 74 65 68  Resp: 15 16 16 16   Temp: 100 F (37.8 C) 99.2 F (37.3 C) 98.8 F (37.1 C) 98.6 F (37 C)  TempSrc: Oral Oral Oral Oral  SpO2: 100% 100% 97% 99%    Intake/Output Summary (Last 24 hours) at 01/19/2021 1829 Last data filed at 01/18/2021 1900 Gross per 24 hour  Intake 1000 ml  Output 2250 ml  Net  -1250 ml   There were no vitals filed for this visit.  Examination:  General exam: Appears calm and comfortable  Respiratory system: Clear to auscultation. Respiratory effort normal. Cardiovascular system: S1 & S2 heard, RRR. No JVD, murmurs, rubs, gallops or clicks. No pedal edema. Gastrointestinal system: Abdomen is nondistended, soft and nontender. No organomegaly or masses felt. Normal bowel sounds heard. Central nervous system: Alert and oriented. No focal neurological deficits. Extremities: Symmetric 5 x 5 power. Skin: No rashes, lesions or ulcers Psychiatry: Judgement and insight appear normal. Mood & affect appropriate.     Data Reviewed: I have personally reviewed following labs and imaging studies  CBC: Recent Labs  Lab 01/18/21 1153 01/19/21 0347  WBC 9.2 8.2  NEUTROABS 7.9*  --   HGB 11.0* 10.1*  HCT 34.6* 31.8*  MCV 86.9 87.8  PLT 238 937   Basic Metabolic Panel: Recent Labs  Lab 01/18/21 1034 01/19/21 0347  NA 137 144  K 3.5 3.0*  CL 101 110  CO2 23 27  GLUCOSE 277* 106*  BUN 12 12  CREATININE 1.11* 1.08*  CALCIUM 8.3* 7.7*   GFR: Estimated Creatinine Clearance: 38.7 mL/min (A) (by C-G formula based on SCr of 1.08 mg/dL (H)). Liver Function Tests: Recent Labs  Lab 01/18/21 1034 01/19/21 0347  AST 23 15  ALT 15 11  ALKPHOS 125 83  BILITOT 0.4 0.5  PROT 6.9 5.6*  ALBUMIN 2.8* 2.1*   Recent Labs  Lab 01/18/21 1034  LIPASE 29   No results for input(s): AMMONIA in the last 168 hours. Coagulation Profile: No results for input(s): INR, PROTIME in the last 168 hours. Cardiac Enzymes: No results for input(s): CKTOTAL, CKMB, CKMBINDEX, TROPONINI in the last 168 hours. BNP (last 3 results) No results for input(s): PROBNP in the last 8760 hours. HbA1C: Recent Labs    01/18/21 2010  HGBA1C 7.0*   CBG: Recent Labs  Lab 01/18/21 2027 01/19/21 0752  GLUCAP 119* 85   Lipid Profile: No results for input(s): CHOL, HDL, LDLCALC, TRIG,  CHOLHDL, LDLDIRECT in the last 72 hours. Thyroid Function Tests: No results for input(s): TSH, T4TOTAL, FREET4, T3FREE, THYROIDAB in the last 72 hours. Anemia Panel: No results for input(s): VITAMINB12, FOLATE, FERRITIN, TIBC, IRON, RETICCTPCT in the last 72 hours. Sepsis Labs: No results for input(s): PROCALCITON, LATICACIDVEN in the last 168 hours.  Recent Results (from the past 240 hour(s))  SARS CORONAVIRUS 2 (TAT 6-24 HRS) Nasopharyngeal Nasopharyngeal Swab     Status: None   Collection Time: 01/18/21  3:55 PM   Specimen: Nasopharyngeal Swab  Result Value Ref Range Status   SARS Coronavirus 2 NEGATIVE NEGATIVE Final    Comment: (NOTE) SARS-CoV-2 target nucleic acids are NOT DETECTED.  The SARS-CoV-2 RNA is generally detectable in upper and lower respiratory specimens during the acute phase of infection. Negative results do not preclude SARS-CoV-2 infection, do not rule out co-infections with other pathogens, and should not be used as the sole basis for treatment or  other patient management decisions. Negative results must be combined with clinical observations, patient history, and epidemiological information. The expected result is Negative.  Fact Sheet for Patients: SugarRoll.be  Fact Sheet for Healthcare Providers: https://www.woods-.com/  This test is not yet approved or cleared by the [place] FDA and  has been authorized for detection and/or diagnosis of SARS-CoV-2 by FDA under an Emergency Use Authorization (EUA). This EUA will remain  in effect (meaning this test can be used) for the duration of the COVID-19 declaration under Se ction 564(b)(1) of the Act, 21 U.S.C. section 360bbb-3(b)(1), unless the authorization is terminated or revoked sooner.  Performed at [facility], [place] [address], [place], [place] [address]   C Difficile Quick Screen w PCR reflex     Status: Abnormal   Collection Time: 01/18/21   7:00 PM   Specimen: STOOL  Result Value Ref Range Status   C Diff antigen POSITIVE (A) NEGATIVE Final   C Diff toxin NEGATIVE NEGATIVE Final   C Diff interpretation Results are indeterminate. See PCR results.  Final    Comment: Performed at [facility], [place] [address] [place],  [address]         Radiology Studies: US Transvaginal Non-OB  Result Date: 01/18/2021 CLINICAL DATA:  Pelvic pain, history cervical cancer, abnormal CT exam demonstrating fluid within uterus EXAM: TRANSABDOMINAL AND TRANSVAGINAL ULTRASOUND OF PELVIS TECHNIQUE: Both transabdominal and transvaginal ultrasound examinations of the pelvis were performed. Transabdominal technique was performed for global imaging of the pelvis including uterus, ovaries, adnexal regions, and pelvic cul-de-sac. It was necessary to proceed with endovaginal exam following the transabdominal exam to visualize the uterus, endometrium, and ovaries. COMPARISON:  CT abdomen and pelvis 02/18/2021 FINDINGS: Uterus Measurements: 10.4 x 6.0 x 7.7 cm = volume: 250 mL. Anteverted. Thinned myometrium. No focal mass. Endometrium Thickness: 50 mm. Endometrial canal markedly distended by complex heterogeneous fluid question blood. No discrete mass visualized Right ovary Not visualized, likely obscured by bowel Left ovary Not visualized, likely obscured by bowel Other findings Significant debris within urinary bladder, which demonstrates slightly irregular wall. No other pelvic masses. No free pelvic fluid. IMPRESSION: Progressive distension of the endometrial canal by complex fluid likely blood with thinning of myometrium. This suggests cervical stenosis, which could be due to benign stricture or mass. Nonvisualization of ovaries. Significant debris within the urinary bladder with slightly irregular bladder wall thickening, could potentially be represent cystitis or hemorrhage though this can also be seen with other etiologies including  tumor, recommend correlation with urinalysis. Electronically Signed   By: Lavonia Dana M.D.   On: 01/18/2021 16:55   US Pelvis Complete  Result Date: 01/18/2021 CLINICAL DATA:  Pelvic pain, history cervical cancer, abnormal CT exam demonstrating fluid within uterus EXAM: TRANSABDOMINAL AND TRANSVAGINAL ULTRASOUND OF PELVIS TECHNIQUE: Both transabdominal and transvaginal ultrasound examinations of the pelvis were performed. Transabdominal technique was performed for global imaging of the pelvis including uterus, ovaries, adnexal regions, and pelvic cul-de-sac. It was necessary to proceed with endovaginal exam following the transabdominal exam to visualize the uterus, endometrium, and ovaries. COMPARISON:  CT abdomen and pelvis 02/18/2021 FINDINGS: Uterus Measurements: 10.4 x 6.0 x 7.7 cm = volume: 250 mL. Anteverted. Thinned myometrium. No focal mass. Endometrium Thickness: 50 mm. Endometrial canal markedly distended by complex heterogeneous fluid question blood. No discrete mass visualized Right ovary Not visualized, likely obscured by bowel Left ovary Not visualized, likely obscured by bowel Other findings Significant debris within urinary bladder, which demonstrates  slightly irregular wall. No other pelvic masses. No free pelvic fluid. IMPRESSION: Progressive distension of the endometrial canal by complex fluid likely blood with thinning of myometrium. This suggests cervical stenosis, which could be due to benign stricture or mass. Nonvisualization of ovaries. Significant debris within the urinary bladder with slightly irregular bladder wall thickening, could potentially be represent cystitis or hemorrhage though this can also be seen with other etiologies including tumor, recommend correlation with urinalysis. Electronically Signed   By: Lavonia Dana M.D.   On: 01/18/2021 16:55   CT ABDOMEN PELVIS W CONTRAST  Result Date: 01/18/2021 CLINICAL DATA:  Right lower quadrant abdominal pain with significant  tenderness to palpation, vomiting, history cervical cancer, kidney stones, type II diabetes mellitus, hypertension, stroke, appendectomy, former smoker EXAM: CT ABDOMEN AND PELVIS WITH CONTRAST TECHNIQUE: Multidetector CT imaging of the abdomen and pelvis was performed using the standard protocol following bolus administration of intravenous contrast. Sagittal and coronal MPR images reconstructed from axial data set. CONTRAST:  161m OMNIPAQUE IOHEXOL 300 MG/ML SOLN IV. No oral contrast. COMPARISON:  12/12/2019 FINDINGS: Lower chest: Dependent bibasilar atelectasis, decreased. Hepatobiliary: Gallbladder and liver normal appearance Pancreas: Atrophic pancreas without mass Spleen: Normal appearance.  Tiny splenule posterior to spleen. Adrenals/Urinary Tract: Adrenal glands normal appearance. BILATERAL renal cortical thinning without mass. BILATERAL hydronephrosis and hydroureter with wall thickening and enhancement of the ureters and renal pelves, raising question of urinary tract infection; recommend correlation with urinalysis. No urinary tract calcifications. Bladder grossly unremarkable, portions suboptimally visualized due to beam hardening artifacts in pelvis. Stomach/Bowel: Appendix surgically absent by history. Stomach decompressed. Mildly prominent stool throughout colon. Large and small bowel loops otherwise unremarkable. Vascular/Lymphatic: Atherosclerotic calcifications aorta, iliac arteries, visceral arteries. Aorta normal caliber. No adenopathy. Reproductive: Large fluid attenuation structure with a well-defined rim centrally in the pelvis adjacent to the cranial aspect of the urinary bladder, appears to represent endometrial cavity of uterus distended by fluid consistent with hydro me truss. Unremarkable adnexa. Other: No free air or free fluid. No hernia or inflammatory process. Musculoskeletal: Osseous demineralization. LEFT hip prosthesis. Scattered degenerative disc and facet disease changes of the  spine. IMPRESSION: BILATERAL hydronephrosis and hydroureter with wall thickening and enhancement of the ureters and renal pelves, raising question of urinary tract infection, with no obstructing ureteral calcifications seen; recommend correlation with urinalysis. Large fluid attenuation structure with a well-defined rim in the pelvis adjacent to the cranial aspect of the urinary bladder, appears to represent endometrial cavity of uterus distended by fluid consistent with hydrometros; recommend assessment by pelvic and transvaginal ultrasound. Post appendectomy. Prominent stool in throughout colon. Aortic Atherosclerosis (ICD10-I70.0). Electronically Signed   By: MLavonia DanaM.D.   On: 01/18/2021 12:58   DG Chest Portable 1 View  Result Date: 01/18/2021 CLINICAL DATA:  Fever EXAM: PORTABLE CHEST 1 VIEW COMPARISON:  02/10/2020 FINDINGS: Heart and mediastinal contours are within normal limits. No focal opacities or effusions. No acute bony abnormality. IMPRESSION: Negative. Electronically Signed   By: KRolm BaptiseM.D.   On: 01/18/2021 11:07        Scheduled Meds: . atorvastatin  40 mg Oral QHS  . Chlorhexidine Gluconate Cloth  6 each Topical Daily  . enoxaparin (LOVENOX) injection  40 mg Subcutaneous Q24H  . gemfibrozil  600 mg Oral BID  . insulin aspart  0-15 Units Subcutaneous TID WC  . insulin aspart  0-5 Units Subcutaneous QHS  . insulin glargine  20 Units Subcutaneous QHS  . [START ON 01/21/2021] levothyroxine  100 mcg  Oral Weekly  . oxybutynin  10 mg Oral Daily  . polyethylene glycol  17 g Oral BID   Continuous Infusions: . sodium chloride 75 mL/hr at 01/18/21 2119  . cefTRIAXone (ROCEPHIN)  IV       LOS: 1 day   Georgette Shell, MD  01/19/2021, 9:03 AM

## 2021-01-19 NOTE — Progress Notes (Signed)
Patient ID: [name], female   DOB: 25-May-1946, 75 y.o.   MRN: [id number]    Subjective: Pt much more lucid.  Denies pain.  Completing bowel prep for eventual colonoscopy.  GYN has not yet seen her.  Objective: Vital signs in last 24 hours: Temp:  [98.6 F (37 C)-100 F (37.8 C)] 98.6 F (37 C) (03/05 0607) Pulse Rate:  [63-100] 68 (03/05 0607) Resp:  [12-18] 16 (03/05 0607) BP: (118-159)/(45-64) 132/51 (03/05 0607) SpO2:  [96 %-100 %] 99 % (03/05 0607)  Intake/Output from previous day: 03/04 0701 - 03/05 0700 In: 1000 [IV Piggyback:1000] Out: 2250 [Urine:2250] Intake/Output this shift: Total I/O In: 240 [P.O.:240] Out: 550 [Urine:550]  Physical Exam:  General: Alert and oriented GU: No CVAT, urine clear in catheter  Lab Results: Recent Labs    01/18/21 1153 01/19/21 0347  HGB 11.0* 10.1*  HCT 34.6* 31.8*   CBC Latest Ref Rng & Units 01/19/2021 01/18/2021 10/01/2020  WBC 4.0 - 10.5 K/uL 8.2 9.2 7.3  Hemoglobin 12.0 - 15.0 g/dL 10.1(L) 11.0(L) 10.9(L)  Hematocrit 36.0 - 46.0 % 31.8(L) 34.6(L) 34.6(L)  Platelets 150 - 400 K/uL 228 238 228     BMET Recent Labs    01/18/21 1034 01/19/21 0347  NA 137 144  K 3.5 3.0*  CL 101 110  CO2 23 27  GLUCOSE 277* 106*  BUN 12 12  CREATININE 1.11* 1.08*  CALCIUM 8.3* 7.7*     Studies/Results: US Transvaginal Non-OB  Result Date: 01/18/2021 CLINICAL DATA:  Pelvic pain, history cervical cancer, abnormal CT exam demonstrating fluid within uterus EXAM: TRANSABDOMINAL AND TRANSVAGINAL ULTRASOUND OF PELVIS TECHNIQUE: Both transabdominal and transvaginal ultrasound examinations of the pelvis were performed. Transabdominal technique was performed for global imaging of the pelvis including uterus, ovaries, adnexal regions, and pelvic cul-de-sac. It was necessary to proceed with endovaginal exam following the transabdominal exam to visualize the uterus, endometrium, and ovaries. COMPARISON:  CT abdomen and pelvis 02/18/2021  FINDINGS: Uterus Measurements: 10.4 x 6.0 x 7.7 cm = volume: 250 mL. Anteverted. Thinned myometrium. No focal mass. Endometrium Thickness: 50 mm. Endometrial canal markedly distended by complex heterogeneous fluid question blood. No discrete mass visualized Right ovary Not visualized, likely obscured by bowel Left ovary Not visualized, likely obscured by bowel Other findings Significant debris within urinary bladder, which demonstrates slightly irregular wall. No other pelvic masses. No free pelvic fluid. IMPRESSION: Progressive distension of the endometrial canal by complex fluid likely blood with thinning of myometrium. This suggests cervical stenosis, which could be due to benign stricture or mass. Nonvisualization of ovaries. Significant debris within the urinary bladder with slightly irregular bladder wall thickening, could potentially be represent cystitis or hemorrhage though this can also be seen with other etiologies including tumor, recommend correlation with urinalysis. Electronically Signed   By: Lavonia Dana M.D.   On: 01/18/2021 16:55   US Pelvis Complete  Result Date: 01/18/2021 CLINICAL DATA:  Pelvic pain, history cervical cancer, abnormal CT exam demonstrating fluid within uterus EXAM: TRANSABDOMINAL AND TRANSVAGINAL ULTRASOUND OF PELVIS TECHNIQUE: Both transabdominal and transvaginal ultrasound examinations of the pelvis were performed. Transabdominal technique was performed for global imaging of the pelvis including uterus, ovaries, adnexal regions, and pelvic cul-de-sac. It was necessary to proceed with endovaginal exam following the transabdominal exam to visualize the uterus, endometrium, and ovaries. COMPARISON:  CT abdomen and pelvis 02/18/2021 FINDINGS: Uterus Measurements: 10.4 x 6.0 x 7.7 cm = volume: 250 mL. Anteverted. Thinned myometrium. No focal mass. Endometrium Thickness: 50  mm. Endometrial canal markedly distended by complex heterogeneous fluid question blood. No discrete mass  visualized Right ovary Not visualized, likely obscured by bowel Left ovary Not visualized, likely obscured by bowel Other findings Significant debris within urinary bladder, which demonstrates slightly irregular wall. No other pelvic masses. No free pelvic fluid. IMPRESSION: Progressive distension of the endometrial canal by complex fluid likely blood with thinning of myometrium. This suggests cervical stenosis, which could be due to benign stricture or mass. Nonvisualization of ovaries. Significant debris within the urinary bladder with slightly irregular bladder wall thickening, could potentially be represent cystitis or hemorrhage though this can also be seen with other etiologies including tumor, recommend correlation with urinalysis. Electronically Signed   By: Lavonia Dana M.D.   On: 01/18/2021 16:55   CT ABDOMEN PELVIS W CONTRAST  Result Date: 01/18/2021 CLINICAL DATA:  Right lower quadrant abdominal pain with significant tenderness to palpation, vomiting, history cervical cancer, kidney stones, type II diabetes mellitus, hypertension, stroke, appendectomy, former smoker EXAM: CT ABDOMEN AND PELVIS WITH CONTRAST TECHNIQUE: Multidetector CT imaging of the abdomen and pelvis was performed using the standard protocol following bolus administration of intravenous contrast. Sagittal and coronal MPR images reconstructed from axial data set. CONTRAST:  1104m OMNIPAQUE IOHEXOL 300 MG/ML SOLN IV. No oral contrast. COMPARISON:  12/12/2019 FINDINGS: Lower chest: Dependent bibasilar atelectasis, decreased. Hepatobiliary: Gallbladder and liver normal appearance Pancreas: Atrophic pancreas without mass Spleen: Normal appearance.  Tiny splenule posterior to spleen. Adrenals/Urinary Tract: Adrenal glands normal appearance. BILATERAL renal cortical thinning without mass. BILATERAL hydronephrosis and hydroureter with wall thickening and enhancement of the ureters and renal pelves, raising question of urinary tract infection;  recommend correlation with urinalysis. No urinary tract calcifications. Bladder grossly unremarkable, portions suboptimally visualized due to beam hardening artifacts in pelvis. Stomach/Bowel: Appendix surgically absent by history. Stomach decompressed. Mildly prominent stool throughout colon. Large and small bowel loops otherwise unremarkable. Vascular/Lymphatic: Atherosclerotic calcifications aorta, iliac arteries, visceral arteries. Aorta normal caliber. No adenopathy. Reproductive: Large fluid attenuation structure with a well-defined rim centrally in the pelvis adjacent to the cranial aspect of the urinary bladder, appears to represent endometrial cavity of uterus distended by fluid consistent with hydro me truss. Unremarkable adnexa. Other: No free air or free fluid. No hernia or inflammatory process. Musculoskeletal: Osseous demineralization. LEFT hip prosthesis. Scattered degenerative disc and facet disease changes of the spine. IMPRESSION: BILATERAL hydronephrosis and hydroureter with wall thickening and enhancement of the ureters and renal pelves, raising question of urinary tract infection, with no obstructing ureteral calcifications seen; recommend correlation with urinalysis. Large fluid attenuation structure with a well-defined rim in the pelvis adjacent to the cranial aspect of the urinary bladder, appears to represent endometrial cavity of uterus distended by fluid consistent with hydrometros; recommend assessment by pelvic and transvaginal ultrasound. Post appendectomy. Prominent stool in throughout colon. Aortic Atherosclerosis (ICD10-I70.0). Electronically Signed   By: MLavonia DanaM.D.   On: 01/18/2021 12:58   DG Chest Portable 1 View  Result Date: 01/18/2021 CLINICAL DATA:  Fever EXAM: PORTABLE CHEST 1 VIEW COMPARISON:  02/10/2020 FINDINGS: Heart and mediastinal contours are within normal limits. No focal opacities or effusions. No acute bony abnormality. IMPRESSION: Negative. Electronically  Signed   By: KRolm BaptiseM.D.   On: 01/18/2021 11:07    Assessment/Plan: 1) UTI: Continue antibiotics.  Culture pending. 2) Bilateral hydronephrosis: Most likely due to either bladder outlet obstruction or bilateral ureteral obstruction.  Unclear if uterine findings on CT are the cause for these findings or  not.  Awaiting GYN evaluation.   Will consider repeating renal ultrasound tomorrow to see if hydronephrosis is resolved with catheter placement.  Renal function remains normal.   LOS: 1 day   Dutch Gray 01/19/2021, 12:10 PM

## 2021-01-20 ENCOUNTER — Inpatient Hospital Stay (HOSPITAL_COMMUNITY): Payer: Medicare HMO

## 2021-01-20 DIAGNOSIS — R1030 Lower abdominal pain, unspecified: Secondary | ICD-10-CM | POA: Diagnosis not present

## 2021-01-20 DIAGNOSIS — R1110 Vomiting, unspecified: Secondary | ICD-10-CM

## 2021-01-20 DIAGNOSIS — R112 Nausea with vomiting, unspecified: Secondary | ICD-10-CM

## 2021-01-20 DIAGNOSIS — N3001 Acute cystitis with hematuria: Secondary | ICD-10-CM | POA: Diagnosis not present

## 2021-01-20 LAB — GASTROINTESTINAL PANEL BY PCR, STOOL (REPLACES STOOL CULTURE)

## 2021-01-20 LAB — CBC
HCT: 29.1 % — ABNORMAL LOW (ref 36.0–46.0)
Hemoglobin: 9.2 g/dL — ABNORMAL LOW (ref 12.0–15.0)
MCH: 27.8 pg (ref 26.0–34.0)
MCHC: 31.6 g/dL (ref 30.0–36.0)
MCV: 87.9 fL (ref 80.0–100.0)
Platelets: 218 10*3/uL (ref 150–400)
RBC: 3.31 MIL/uL — ABNORMAL LOW (ref 3.87–5.11)
RDW: 13.3 % (ref 11.5–15.5)
WBC: 5.7 10*3/uL (ref 4.0–10.5)
nRBC: 0 % (ref 0.0–0.2)

## 2021-01-20 LAB — COMPREHENSIVE METABOLIC PANEL
ALT: 10 U/L (ref 0–44)
AST: 14 U/L — ABNORMAL LOW (ref 15–41)
Albumin: 2 g/dL — ABNORMAL LOW (ref 3.5–5.0)
Alkaline Phosphatase: 88 U/L (ref 38–126)
Anion gap: 8 (ref 5–15)
BUN: 13 mg/dL (ref 8–23)
CO2: 24 mmol/L (ref 22–32)
Calcium: 7.4 mg/dL — ABNORMAL LOW (ref 8.9–10.3)
Chloride: 112 mmol/L — ABNORMAL HIGH (ref 98–111)
Creatinine, Ser: 0.97 mg/dL (ref 0.44–1.00)
GFR, Estimated: >60 mL/min (ref >60)
Glucose, Bld: 118 mg/dL — ABNORMAL HIGH (ref 70–99)
Potassium: 3.3 mmol/L — ABNORMAL LOW (ref 3.5–5.1)
Sodium: 144 mmol/L (ref 135–145)
Total Bilirubin: 0.4 mg/dL (ref 0.3–1.2)
Total Protein: 5.2 g/dL — ABNORMAL LOW (ref 6.5–8.1)

## 2021-01-20 LAB — GLUCOSE, CAPILLARY
Glucose-Capillary: 69 mg/dL — ABNORMAL LOW (ref 70–99)
Glucose-Capillary: 102 mg/dL — ABNORMAL HIGH (ref 70–99)
Glucose-Capillary: 120 mg/dL — ABNORMAL HIGH (ref 70–99)
Glucose-Capillary: 261 mg/dL — ABNORMAL HIGH (ref 70–99)
Glucose-Capillary: 165 mg/dL — ABNORMAL HIGH (ref 70–99)

## 2021-01-20 MED ORDER — INSULIN GLARGINE 100 UNIT/ML ~~LOC~~ SOLN
14.0000 [IU] | Freq: Every day | SUBCUTANEOUS | Status: DC
  Administered 2021-01-20 – 2021-01-22 (×3): 14 [IU] via SUBCUTANEOUS
  Filled 2021-01-20 (×4): qty 0.14

## 2021-01-20 MED ORDER — POTASSIUM CHLORIDE CRYS ER 20 MEQ PO TBCR
40.0000 meq | EXTENDED_RELEASE_TABLET | Freq: Once | ORAL | Status: AC
  Administered 2021-01-20: 40 meq via ORAL
  Filled 2021-01-20: qty 2

## 2021-01-20 MED ORDER — MAGNESIUM SULFATE 4 GM/100ML IV SOLN
4.0000 g | Freq: Once | INTRAVENOUS | Status: AC
  Administered 2021-01-20: 4 g via INTRAVENOUS
  Filled 2021-01-20: qty 100

## 2021-01-20 NOTE — Progress Notes (Addendum)
Patient ID: [name], female   DOB: 04-08-1946, 75 y.o.   MRN: [id number]    Subjective: No changes.  Denies flank pain.  Objective: Vital signs in last 24 hours: Temp:  [97.8 F (36.6 C)-98.1 F (36.7 C)] 97.9 F (36.6 C) (03/06 0439) Pulse Rate:  [64-74] 65 (03/06 0439) Resp:  [14-16] 16 (03/06 0439) BP: (127-148)/(52-54) 127/53 (03/06 0439) SpO2:  [97 %-100 %] 97 % (03/06 0439) Weight:  [70.2 kg] 70.2 kg (03/05 1834)  Intake/Output from previous day: 03/05 0701 - 03/06 0700 In: 2126.3 [P.O.:480; I.V.:1546.3; IV Piggyback:100] Out: 2950 [Urine:2950] Intake/Output this shift: No intake/output data recorded.  Physical Exam:  General: Alert and oriented Abd: No CVAT GU: Urine remains clear  Lab Results: Recent Labs    01/18/21 1153 01/19/21 0347 01/20/21 0340  HGB 11.0* 10.1* 9.2*  HCT 34.6* 31.8* 29.1*   CBC Latest Ref Rng & Units 01/20/2021 01/19/2021 01/18/2021  WBC 4.0 - 10.5 K/uL 5.7 8.2 9.2  Hemoglobin 12.0 - 15.0 g/dL 9.2(L) 10.1(L) 11.0(L)  Hematocrit 36.0 - 46.0 % 29.1(L) 31.8(L) 34.6(L)  Platelets 150 - 400 K/uL 218 228 238     BMET Recent Labs    01/19/21 0347 01/20/21 0340  NA 144 144  K 3.0* 3.3*  CL 110 112*  CO2 27 24  GLUCOSE 106* 118*  BUN 12 13  CREATININE 1.08* 0.97  CALCIUM 7.7* 7.4*     Studies/Results: Urine culture pending  Assessment/Plan: 1) UTI: Continue broad spectrum therapy with ceftriaxone pending culture results. 2) Bilateral hydronephrosis: I reviewed her prior imaging studies including CT from January 2021.  This demonstrated similar findings with mild bilateral dilation of renal pelvis and proximal ureters and distended bladder.  Similar uterine findings also noted.  This would suggest a more chronic issue rather than an acute obstruction.  Would continue Foley catheter as she may have some degree of chronic poor bladder emptying.  Will check renal ultrasound today although with normal renal function, would not  proceed with further intervention right now.  However, will be interesting to see if hydronephrosis is resolved with catheter placement as this would suggest urinary retention as the etiology rather than partial bilateral ureteral obstruction (from prior radiation, etc.).  Will also d/c oxybutynin and this along with chronic narcotics may be contributory to poor bladder emptying as well.   LOS: 2 days   Dutch Gray 01/20/2021, 8:38 AM

## 2021-01-20 NOTE — Progress Notes (Signed)
PROGRESS NOTE    FAIZA [name]  AVW:[id number] DOB: 24-Oct-1946 DOA: 01/18/2021 PCP: Everardo Beals, NP  Brief Narrative: [name] is a 75 y.o. female with medical history significant of cervical cancer, DM2, HTN, renal stones, chronic abdominal pain. Presenting with abdominal pain. History per daughter as patient is confused. Daughter reports that patient has had abdominal pain for several months. She has been followed by [facility] and has had c-scope and EGD performed. Her C-scope was not able to be completed due to fecal impaction. She was scheduled for a follow up c-scope next week. Her LLQ abdominal pain has been worsening over the last few days. This prompted a call to [facility] and that is when the c-scope was moved to next week. Last night, her daughter found the pain febrile with chills. Tempw as up to 103. She was nauseous and had some vomiting. The daughter called [facility] and it was recommended that the patient come to the ED. However, the patient refused. When the patient woke this morning, her symptoms continued and now included confusion. The family brought her to the ED for help. They deny any other aggravating or alleviating factors.   ED Course: CT found a large amount of stool in the colon. It also found a fluid collection in the pelvic cavity. This collection looks like it's causing hydronephrosis. Urology was consulted. Per EDPA, recommended foley and abx for UTI. [facility] was consulted. OBGyn was consulted. Transvaginal US ordered. [facility] called for admission.   Assessment & Plan:   Active Problems:   UTI (urinary tract infection)   Lower abdominal pain    #1 Bilateral hydronephrosis? Etiology. seen by urology. Possible bilateral ureteral obstruction versus bladder outlet obstruction. Discussed with GYN who does not think this is caused by external compression of the endometrial fluid collection/massFoley has been placed continue. Check renal ultrasound today see if hydronephrosis  has resolved.   #2 constipation/diarhea-patient has chronic constipation. GI following and has made changes to her stool softeners.she has had multiple BMS.  #3 endometrial fluid collection- Follow up with GYN outpatient.   #4 type 2 diabetes continue SSI and Lantus. CBG (last 3)  Recent Labs    01/20/21 0726 01/20/21 0828 01/20/21 1117  GLUCAP 69* 102* 120*   Decrease lantus to 14 units 3/6  #5 AKI creatinine 0.9 from  1.08 down from 1.11. Her baseline creatinine is around 0.8.dc ivf.  #6 hypothyroidism continue Synthroid  #7 hyperlipidemia continue statins  #8 history of stroke on statin and Plavix. Continue to hold Plavix for preparation for colonoscopy.  #9 iron deficiency anemia GI planning colonoscopy this admit.  #10 hypokalemia replete and recheck labs in a.m.  #11 UTI urine culture pending UA consistent with UTI continue Rocephin  Estimated body mass index is 30.23 kg/m as calculated from the following:   Height as of this encounter: 5' (1.524 m).   Weight as of this encounter: 70.2 kg.  DVT prophylaxis: Lovenox  code Status: Full code Family Communication: Discussed with daughter and granddaughter both of them nurses and a three-way call Disposition Plan:  Status is: Inpatient  Dispo: The patient is from: Home              Anticipated d/c is to: Home              Patient currently is not medically stable to d/c.   Difficult to place patient na    Consultants: Gynecology, GI, urology  Procedures: None Antimicrobials: Rocephin Subjective:  Patient  resting in bed she is awake alert  Answering all questions appropriately and following commands. Objective: Vitals:   01/19/21 1337 01/19/21 1834 01/19/21 2103 01/20/21 0439  BP: (!) 142/52  (!) 148/54 (!) 127/53  Pulse: 64  74 65  Resp: 15  14 16   Temp: 97.8 F (36.6 C)  98.1 F (36.7 C) 97.9 F (36.6 C)  TempSrc: Oral  Oral Oral  SpO2: 100%  100% 97%  Weight:  70.2 kg    Height:  5' (1.524 m)       Intake/Output Summary (Last 24 hours) at 01/20/2021 1207 Last data filed at 01/20/2021 0700 Gross per 24 hour  Intake 2006.3 ml  Output 1950 ml  Net 56.3 ml   Filed Weights   01/19/21 1834  Weight: 70.2 kg    Examination:  General exam: Appears calm and comfortable  Respiratory system: Clear to auscultation. Respiratory effort normal. Cardiovascular system: S1 & S2 heard, RRR. No JVD, murmurs, rubs, gallops or clicks. No pedal edema. Gastrointestinal system: Abdomen is nondistended, soft and nontender. No organomegaly or masses felt. Normal bowel sounds heard. Central nervous system: Alert and oriented. No focal neurological deficits. Extremities: Symmetric 5 x 5 power. Skin: No rashes, lesions or ulcers Psychiatry: Judgement and insight appear normal. Mood & affect appropriate.     Data Reviewed: I have personally reviewed following labs and imaging studies  CBC: Recent Labs  Lab 01/18/21 1153 01/19/21 0347 01/20/21 0340  WBC 9.2 8.2 5.7  NEUTROABS 7.9*  --   --   HGB 11.0* 10.1* 9.2*  HCT 34.6* 31.8* 29.1*  MCV 86.9 87.8 87.9  PLT 238 228 518   Basic Metabolic Panel: Recent Labs  Lab 01/18/21 1034 01/19/21 0347 01/20/21 0340  NA 137 144 144  K 3.5 3.0* 3.3*  CL 101 110 112*  CO2 23 27 24   GLUCOSE 277* 106* 118*  BUN 12 12 13   CREATININE 1.11* 1.08* 0.97  CALCIUM 8.3* 7.7* 7.4*  MG  --  1.2*  --    GFR: Estimated Creatinine Clearance: 44.5 mL/min (by C-G formula based on SCr of 0.97 mg/dL). Liver Function Tests: Recent Labs  Lab 01/18/21 1034 01/19/21 0347 01/20/21 0340  AST 23 15 14*  ALT 15 11 10   ALKPHOS 125 83 88  BILITOT 0.4 0.5 0.4  PROT 6.9 5.6* 5.2*  ALBUMIN 2.8* 2.1* 2.0*   Recent Labs  Lab 01/18/21 1034  LIPASE 29   No results for input(s): AMMONIA in the last 168 hours. Coagulation Profile: No results for input(s): INR, PROTIME in the last 168 hours. Cardiac Enzymes: No results for input(s): CKTOTAL, CKMB, CKMBINDEX,  TROPONINI in the last 168 hours. BNP (last 3 results) No results for input(s): PROBNP in the last 8760 hours. HbA1C: Recent Labs    01/18/21 2010  HGBA1C 7.0*   CBG: Recent Labs  Lab 01/19/21 1605 01/19/21 2104 01/20/21 0726 01/20/21 0828 01/20/21 1117  GLUCAP 134* 163* 69* 102* 120*   Lipid Profile: No results for input(s): CHOL, HDL, LDLCALC, TRIG, CHOLHDL, LDLDIRECT in the last 72 hours. Thyroid Function Tests: No results for input(s): TSH, T4TOTAL, FREET4, T3FREE, THYROIDAB in the last 72 hours. Anemia Panel: No results for input(s): VITAMINB12, FOLATE, FERRITIN, TIBC, IRON, RETICCTPCT in the last 72 hours. Sepsis Labs: No results for input(s): PROCALCITON, LATICACIDVEN in the last 168 hours.  Recent Results (from the past 240 hour(s))  Urine culture     Status: Abnormal (Preliminary result)   Collection Time: 01/18/21  2:00  PM   Specimen: Urine, Random  Result Value Ref Range Status   Specimen Description   Final    URINE, RANDOM Performed at [facility] [address], [place], [place] [address]    Special Requests   Final    NONE Performed at [facility], [place] [address], [place], [place] [address]    Culture (A)  Final    >=100,000 COLONIES/mL GRAM NEGATIVE RODS SUSCEPTIBILITIES TO FOLLOW Performed at [facility], [place] [address] [place], [place] [address]    Report Status PENDING  Incomplete  SARS CORONAVIRUS 2 (TAT 6-24 HRS) Nasopharyngeal Nasopharyngeal Swab     Status: None   Collection Time: 01/18/21  3:55 PM   Specimen: Nasopharyngeal Swab  Result Value Ref Range Status   SARS Coronavirus 2 NEGATIVE NEGATIVE Final    Comment: (NOTE) SARS-CoV-2 target nucleic acids are NOT DETECTED.  The SARS-CoV-2 RNA is generally detectable in upper and lower respiratory specimens during the acute phase of infection. Negative results do not preclude SARS-CoV-2 infection, do not rule out co-infections with other  pathogens, and should not be used as the sole basis for treatment or other patient management decisions. Negative results must be combined with clinical observations, patient history, and epidemiological information. The expected result is Negative.  Fact Sheet for Patients: SugarRoll.be  Fact Sheet for Healthcare Providers: https://www.woods-.com/  This test is not yet approved or cleared by the [place] FDA and  has been authorized for detection and/or diagnosis of SARS-CoV-2 by FDA under an Emergency Use Authorization (EUA). This EUA will remain  in effect (meaning this test can be used) for the duration of the COVID-19 declaration under Se ction 564(b)(1) of the Act, 21 U.S.C. section 360bbb-3(b)(1), unless the authorization is terminated or revoked sooner.  Performed at [facility], [place] [address], [place], [place] [address]   Blood culture (routine x 2)     Status: None (Preliminary result)   Collection Time: 01/18/21  6:30 PM   Specimen: BLOOD LEFT HAND  Result Value Ref Range Status   Specimen Description   Final    BLOOD LEFT HAND Performed at [facility] [address] [place], [place] [address]    Special Requests   Final    BOTTLES DRAWN AEROBIC AND ANAEROBIC Blood Culture results may not be optimal due to an inadequate volume of blood received in culture bottles Performed at [facility] [address], [place], [place] [address]    Culture   Final    NO GROWTH 2 DAYS Performed at [facility] [address], [place], [place] [address]    Report Status PENDING  Incomplete  C Difficile Quick Screen w PCR reflex     Status: Abnormal   Collection Time: 01/18/21  7:00 PM   Specimen: STOOL  Result Value Ref Range Status   C Diff antigen POSITIVE (A) NEGATIVE Final   C Diff toxin NEGATIVE NEGATIVE Final   C Diff interpretation Results are indeterminate. See  PCR results.  Final    Comment: Performed at [facility], [place] [address], [place], [place] [address]  Gastrointestinal Panel by PCR , Stool     Status: None   Collection Time: 01/18/21  7:00 PM   Specimen: STOOL  Result Value Ref Range Status   Campylobacter species NOT DETECTED NOT DETECTED Final   Plesimonas shigelloides NOT DETECTED NOT DETECTED Final   Salmonella species  NOT DETECTED NOT DETECTED Final   Yersinia enterocolitica NOT DETECTED NOT DETECTED Final   Vibrio species NOT DETECTED NOT DETECTED Final   Vibrio cholerae NOT DETECTED NOT DETECTED Final   Enteroaggregative E coli (EAEC) NOT DETECTED NOT DETECTED Final   Enteropathogenic E coli (EPEC) NOT DETECTED NOT DETECTED Final   Enterotoxigenic E coli (ETEC) NOT DETECTED NOT DETECTED Final   Shiga like toxin producing E coli (STEC) NOT DETECTED NOT DETECTED Final   Shigella/Enteroinvasive E coli (EIEC) NOT DETECTED NOT DETECTED Final   Cryptosporidium NOT DETECTED NOT DETECTED Final   Cyclospora cayetanensis NOT DETECTED NOT DETECTED Final   Entamoeba histolytica NOT DETECTED NOT DETECTED Final   Giardia lamblia NOT DETECTED NOT DETECTED Final   Adenovirus F40/41 NOT DETECTED NOT DETECTED Final   Astrovirus NOT DETECTED NOT DETECTED Final   Norovirus GI/GII NOT DETECTED NOT DETECTED Final   Rotavirus A NOT DETECTED NOT DETECTED Final   Sapovirus (I, II, IV, and V) NOT DETECTED NOT DETECTED Final    Comment: Performed at [facility], [place] [address] [place], [place] [address]  Blood culture (routine x 2)     Status: None (Preliminary result)   Collection Time: 01/18/21  8:10 PM   Specimen: BLOOD LEFT HAND  Result Value Ref Range Status   Specimen Description   Final    BLOOD LEFT HAND Performed at [facility], [place] [address] [place], [place] [address]    Special Requests   Final    BOTTLES DRAWN AEROBIC AND ANAEROBIC Blood Culture results may not be optimal due to an  inadequate volume of blood received in culture bottles Performed at [facility] [address] [place], [place] [address]    Culture   Final    NO GROWTH 2 DAYS Performed at [facility] [address] [place], [place] [address]    Report Status PENDING  Incomplete         Radiology Studies: US Transvaginal Non-OB  Result Date: 01/18/2021 CLINICAL DATA:  Pelvic pain, history cervical cancer, abnormal CT exam demonstrating fluid within uterus EXAM: TRANSABDOMINAL AND TRANSVAGINAL ULTRASOUND OF PELVIS TECHNIQUE: Both transabdominal and transvaginal ultrasound examinations of the pelvis were performed. Transabdominal technique was performed for global imaging of the pelvis including uterus, ovaries, adnexal regions, and pelvic cul-de-sac. It was necessary to proceed with endovaginal exam following the transabdominal exam to visualize the uterus, endometrium, and ovaries. COMPARISON:  CT abdomen and pelvis 02/18/2021 FINDINGS: Uterus Measurements: 10.4 x 6.0 x 7.7 cm = volume: 250 mL. Anteverted. Thinned myometrium. No focal mass. Endometrium Thickness: 50 mm. Endometrial canal markedly distended by complex heterogeneous fluid question blood. No discrete mass visualized Right ovary Not visualized, likely obscured by bowel Left ovary Not visualized, likely obscured by bowel Other findings Significant debris within urinary bladder, which demonstrates slightly irregular wall. No other pelvic masses. No free pelvic fluid. IMPRESSION: Progressive distension of the endometrial canal by complex fluid likely blood with thinning of myometrium. This suggests cervical stenosis, which could be due to benign stricture or mass. Nonvisualization of ovaries. Significant debris within the urinary bladder with slightly irregular bladder wall thickening, could potentially be represent cystitis or hemorrhage though this can also be seen with other etiologies including tumor, recommend  correlation with urinalysis. Electronically Signed   By: Lavonia Dana M.D.   On: 01/18/2021 16:55   US Pelvis Complete  Result Date: 01/18/2021 CLINICAL DATA:  Pelvic pain, history cervical cancer, abnormal CT  exam demonstrating fluid within uterus EXAM: TRANSABDOMINAL AND TRANSVAGINAL ULTRASOUND OF PELVIS TECHNIQUE: Both transabdominal and transvaginal ultrasound examinations of the pelvis were performed. Transabdominal technique was performed for global imaging of the pelvis including uterus, ovaries, adnexal regions, and pelvic cul-de-sac. It was necessary to proceed with endovaginal exam following the transabdominal exam to visualize the uterus, endometrium, and ovaries. COMPARISON:  CT abdomen and pelvis 02/18/2021 FINDINGS: Uterus Measurements: 10.4 x 6.0 x 7.7 cm = volume: 250 mL. Anteverted. Thinned myometrium. No focal mass. Endometrium Thickness: 50 mm. Endometrial canal markedly distended by complex heterogeneous fluid question blood. No discrete mass visualized Right ovary Not visualized, likely obscured by bowel Left ovary Not visualized, likely obscured by bowel Other findings Significant debris within urinary bladder, which demonstrates slightly irregular wall. No other pelvic masses. No free pelvic fluid. IMPRESSION: Progressive distension of the endometrial canal by complex fluid likely blood with thinning of myometrium. This suggests cervical stenosis, which could be due to benign stricture or mass. Nonvisualization of ovaries. Significant debris within the urinary bladder with slightly irregular bladder wall thickening, could potentially be represent cystitis or hemorrhage though this can also be seen with other etiologies including tumor, recommend correlation with urinalysis. Electronically Signed   By: Lavonia Dana M.D.   On: 01/18/2021 16:55   CT ABDOMEN PELVIS W CONTRAST  Result Date: 01/18/2021 CLINICAL DATA:  Right lower quadrant abdominal pain with significant tenderness to palpation,  vomiting, history cervical cancer, kidney stones, type II diabetes mellitus, hypertension, stroke, appendectomy, former smoker EXAM: CT ABDOMEN AND PELVIS WITH CONTRAST TECHNIQUE: Multidetector CT imaging of the abdomen and pelvis was performed using the standard protocol following bolus administration of intravenous contrast. Sagittal and coronal MPR images reconstructed from axial data set. CONTRAST:  154m OMNIPAQUE IOHEXOL 300 MG/ML SOLN IV. No oral contrast. COMPARISON:  12/12/2019 FINDINGS: Lower chest: Dependent bibasilar atelectasis, decreased. Hepatobiliary: Gallbladder and liver normal appearance Pancreas: Atrophic pancreas without mass Spleen: Normal appearance.  Tiny splenule posterior to spleen. Adrenals/Urinary Tract: Adrenal glands normal appearance. BILATERAL renal cortical thinning without mass. BILATERAL hydronephrosis and hydroureter with wall thickening and enhancement of the ureters and renal pelves, raising question of urinary tract infection; recommend correlation with urinalysis. No urinary tract calcifications. Bladder grossly unremarkable, portions suboptimally visualized due to beam hardening artifacts in pelvis. Stomach/Bowel: Appendix surgically absent by history. Stomach decompressed. Mildly prominent stool throughout colon. Large and small bowel loops otherwise unremarkable. Vascular/Lymphatic: Atherosclerotic calcifications aorta, iliac arteries, visceral arteries. Aorta normal caliber. No adenopathy. Reproductive: Large fluid attenuation structure with a well-defined rim centrally in the pelvis adjacent to the cranial aspect of the urinary bladder, appears to represent endometrial cavity of uterus distended by fluid consistent with hydro me truss. Unremarkable adnexa. Other: No free air or free fluid. No hernia or inflammatory process. Musculoskeletal: Osseous demineralization. LEFT hip prosthesis. Scattered degenerative disc and facet disease changes of the spine. IMPRESSION:  BILATERAL hydronephrosis and hydroureter with wall thickening and enhancement of the ureters and renal pelves, raising question of urinary tract infection, with no obstructing ureteral calcifications seen; recommend correlation with urinalysis. Large fluid attenuation structure with a well-defined rim in the pelvis adjacent to the cranial aspect of the urinary bladder, appears to represent endometrial cavity of uterus distended by fluid consistent with hydrometros; recommend assessment by pelvic and transvaginal ultrasound. Post appendectomy. Prominent stool in throughout colon. Aortic Atherosclerosis (ICD10-I70.0). Electronically Signed   By: MLavonia DanaM.D.   On: 01/18/2021 12:58        Scheduled  Meds: . atorvastatin  40 mg Oral QHS  . bisacodyl  10 mg Oral BID  . Chlorhexidine Gluconate Cloth  6 each Topical Daily  . enoxaparin (LOVENOX) injection  40 mg Subcutaneous Q24H  . gemfibrozil  600 mg Oral BID  . insulin aspart  0-15 Units Subcutaneous TID WC  . insulin aspart  0-5 Units Subcutaneous QHS  . insulin glargine  20 Units Subcutaneous QHS  . [START ON 01/21/2021] levothyroxine  100 mcg Oral Weekly  . polyethylene glycol  34 g Oral BID   Continuous Infusions: . sodium chloride 75 mL/hr at 01/19/21 1834  . cefTRIAXone (ROCEPHIN)  IV Stopped (01/19/21 1746)     LOS: 2 days   Georgette Shell, MD  01/20/2021, 12:07 PM

## 2021-01-21 DIAGNOSIS — N1330 Unspecified hydronephrosis: Secondary | ICD-10-CM

## 2021-01-21 DIAGNOSIS — D500 Iron deficiency anemia secondary to blood loss (chronic): Secondary | ICD-10-CM

## 2021-01-21 DIAGNOSIS — N1339 Other hydronephrosis: Secondary | ICD-10-CM

## 2021-01-21 DIAGNOSIS — R1030 Lower abdominal pain, unspecified: Secondary | ICD-10-CM | POA: Diagnosis not present

## 2021-01-21 DIAGNOSIS — N390 Urinary tract infection, site not specified: Secondary | ICD-10-CM

## 2021-01-21 DIAGNOSIS — N3001 Acute cystitis with hematuria: Secondary | ICD-10-CM | POA: Diagnosis not present

## 2021-01-21 LAB — GLUCOSE, CAPILLARY
Glucose-Capillary: 160 mg/dL — ABNORMAL HIGH (ref 70–99)
Glucose-Capillary: 128 mg/dL — ABNORMAL HIGH (ref 70–99)
Glucose-Capillary: 98 mg/dL (ref 70–99)
Glucose-Capillary: 180 mg/dL — ABNORMAL HIGH (ref 70–99)
Glucose-Capillary: 140 mg/dL — ABNORMAL HIGH (ref 70–99)

## 2021-01-21 LAB — COMPREHENSIVE METABOLIC PANEL
ALT: 12 U/L (ref 0–44)
AST: 17 U/L (ref 15–41)
Albumin: 2.4 g/dL — ABNORMAL LOW (ref 3.5–5.0)
Alkaline Phosphatase: 100 U/L (ref 38–126)
Anion gap: 10 (ref 5–15)
BUN: 9 mg/dL (ref 8–23)
CO2: 21 mmol/L — ABNORMAL LOW (ref 22–32)
Calcium: 8.1 mg/dL — ABNORMAL LOW (ref 8.9–10.3)
Chloride: 110 mmol/L (ref 98–111)
Creatinine, Ser: 0.86 mg/dL (ref 0.44–1.00)
GFR, Estimated: >60 mL/min (ref >60)
Glucose, Bld: 120 mg/dL — ABNORMAL HIGH (ref 70–99)
Potassium: 2.8 mmol/L — ABNORMAL LOW (ref 3.5–5.1)
Sodium: 141 mmol/L (ref 135–145)
Total Bilirubin: 0.4 mg/dL (ref 0.3–1.2)
Total Protein: 6.1 g/dL — ABNORMAL LOW (ref 6.5–8.1)

## 2021-01-21 LAB — URINE CULTURE: Culture: >=100000 — AB

## 2021-01-21 LAB — MAGNESIUM: Magnesium: 2 mg/dL (ref 1.7–2.4)

## 2021-01-21 MED ORDER — ENOXAPARIN SODIUM 40 MG/0.4ML ~~LOC~~ SOLN
40.0000 mg | SUBCUTANEOUS | Status: AC
  Administered 2021-01-21: 40 mg via SUBCUTANEOUS
  Filled 2021-01-21: qty 0.4

## 2021-01-21 MED ORDER — MELATONIN 3 MG PO TABS
3.0000 mg | ORAL_TABLET | Freq: Once | ORAL | Status: AC
  Administered 2021-01-21: 3 mg via ORAL
  Filled 2021-01-21: qty 1

## 2021-01-21 MED ORDER — POTASSIUM CHLORIDE CRYS ER 20 MEQ PO TBCR
60.0000 meq | EXTENDED_RELEASE_TABLET | Freq: Once | ORAL | Status: AC
  Administered 2021-01-21: 60 meq via ORAL
  Filled 2021-01-21: qty 3

## 2021-01-21 MED ORDER — POLYETHYLENE GLYCOL 3350 17 G PO PACK: 34.0000 g | PACK | Freq: Every day | ORAL | Status: DC

## 2021-01-21 MED ORDER — PEG-KCL-NACL-NASULF-NA ASC-C 100 G PO SOLR
0.5000 | Freq: Two times a day (BID) | ORAL | Status: AC
  Administered 2021-01-22 (×2): 100 g via ORAL
  Filled 2021-01-21: qty 1

## 2021-01-21 MED ORDER — NITROFURANTOIN MONOHYD MACRO 100 MG PO CAPS
100.0000 mg | ORAL_CAPSULE | Freq: Two times a day (BID) | ORAL | Status: DC
  Administered 2021-01-21 – 2021-01-22 (×4): 100 mg via ORAL
  Filled 2021-01-21 (×6): qty 1

## 2021-01-21 MED ORDER — POLYETHYLENE GLYCOL 3350 17 G PO PACK
34.0000 g | PACK | Freq: Two times a day (BID) | ORAL | Status: DC
  Administered 2021-01-21 – 2021-01-22 (×2): 34 g via ORAL
  Filled 2021-01-21 (×3): qty 2

## 2021-01-21 MED ORDER — POTASSIUM CHLORIDE CRYS ER 20 MEQ PO TBCR
40.0000 meq | EXTENDED_RELEASE_TABLET | Freq: Two times a day (BID) | ORAL | Status: AC
  Administered 2021-01-21 (×2): 40 meq via ORAL
  Filled 2021-01-21 (×2): qty 2

## 2021-01-21 MED ORDER — ENOXAPARIN SODIUM 40 MG/0.4ML ~~LOC~~ SOLN: 40.0000 mg | SUBCUTANEOUS | Status: DC

## 2021-01-21 NOTE — Progress Notes (Signed)
Gynecologic Oncology Consultation  [name] 75 y.o. female  CC:  Chief Complaint  Patient presents with  . Fecal Impaction  . Emesis    HPI: [name] is a 75 year old female with a history of Stage IB cervical cancer diagnosed in 1994 and treated with primary chemoradiation at [facility] under the care of Dr. Alease Medina. She has remained disease free since that time. In June 2019, she was being treated for urosepsis related to a right ureteral stone with CT imaging performed at that time. The CT scan reported a 5.3 x 4.4 cm area in the central pelvis. With follow up CT imaging in April 2020, the low density area within the central right hemipelvis had increased to 6.7 x 6 cm. She was seen in consultation with Dr. Everitt Amber in May 2020 where it was felt the 6 cm pelvic mass noted on imaging was actually her uterus. CA 125 was 13.1 at that time. A repeat US in August 2020 was non-diagnostic with patient refusing transvaginal imaging. An MRI was performed on 07/08/2019 resulting hematometra likely secondary to acquired cervical stenosis from prior cervical radiation. She was seen in the office after the MRI with the recommendation for repeat imaging in 6 months to evaluate for stability. The ultrasound was not performed for reasons unclear but it appears the patient had multiple imaging studies during admissions for acute CVA in January 2021, right sided weakness in March 2021, and acute toxic encephalopathy in November 2021.    Most recently, she presented to the ED on 01/18/2021 for worsening abdominal pain, emesis, and fecal impaction. CT imaging was performed on 01/18/2021 revealing bilateral hydronephrosis and hydroureter, large fluid attenuation structure in the pelvis, and prominent stool in the colon. A follow up US to further evaluate the structure in the pelvis on 01/18/2021 showed the uterus measuring 10.4 x 6 x 7.7 cm with endometrial canal markedly distended. She is being followed by  Urology and Gastroenterology during this admission.      Interval History: She states she has been having the left lower abdominal pain "for some time." She noted rectal bleeding with BMs around 1 month ago and notified her PCP who recommended evaluation with her gastroenterologist. She states she had an unsuccessful colonoscopy on 01/02/2021 due to moderate stool in the colon. Due to worsening abdominal pain, fever, and GI issues including nausea/emesis/blood in stools, she sought care in the ER. Her daughter states the patient sees Dr. Lovena Neighbours with Urology and was placed on a prophylactic antibiotic dose given her frequent UTIs. She is shocked that she had a urinary tract infection noted with work up in the ER. She states the abdominal pain has seemed to improve with clearing her bowels. The daughter is questioning whether the process in the uterus would be contributing to the frequent urinary infections.    Review of Systems: Constitutional: Feels weak. States she is able to ambulate but is staying near the [facility] due to frequent bowel movements. Reports weight loss, not significant per daughter. Appetite adequate prior to admission.   Cardiovascular: No chest pain, shortness of breath, or edema.  Pulmonary: No cough or wheeze.  Gastrointestinal: Severe constipation on admission.  Genitourinary: No frequency, urgency, or dysuria. No vaginal bleeding or discharge.  Musculoskeletal: No myalgia or joint pain. Neurologic: Positive for generalized weakness and numbness related to history of neuropathy. Reports bilateral lower leg cellulitis that resolves with oral antibiotics. She has had mild right lower leg edema which she  relates to her past cellulitis. No change in gait.  Psychology: No depression, anxiety, or insomnia.  Current Meds:  Current Facility-Administered Medications:  .  acetaminophen (TYLENOL) tablet 650 mg, 650 mg, Oral, Q6H PRN, 650 mg at 01/19/21 2132 **OR** acetaminophen (TYLENOL)  suppository 650 mg, 650 mg, Rectal, Q6H PRN, Marylyn Ishihara, Tyrone A, DO .  atorvastatin (LIPITOR) tablet 40 mg, 40 mg, Oral, QHS, Kyle, Tyrone A, DO, 40 mg at 01/20/21 2130 .  cefTRIAXone (ROCEPHIN) 1 g in sodium chloride 0.9 % 100 mL IVPB, 1 g, Intravenous, Q24H, Kyle, Tyrone A, DO, Last Rate: 200 mL/hr at 01/20/21 1644, 1 g at 01/20/21 1644 .  Chlorhexidine Gluconate Cloth 2 % PADS 6 each, 6 each, Topical, Daily, Kyle, Tyrone A, DO, 6 each at 01/21/21 0813 .  enoxaparin (LOVENOX) injection 40 mg, 40 mg, Subcutaneous, Q24H, Kyle, Tyrone A, DO, 40 mg at 01/20/21 2134 .  gemfibrozil (LOPID) tablet 600 mg, 600 mg, Oral, BID, Kyle, Tyrone A, DO, 600 mg at 01/20/21 2130 .  insulin aspart (novoLOG) injection 0-15 Units, 0-15 Units, Subcutaneous, TID WC, Kyle, Tyrone A, DO, 2 Units at 01/21/21 0809 .  insulin aspart (novoLOG) injection 0-5 Units, 0-5 Units, Subcutaneous, QHS, Kyle, Tyrone A, DO .  insulin glargine (LANTUS) injection 14 Units, 14 Units, Subcutaneous, QHS, Georgette Shell, MD, 14 Units at 01/20/21 2134 .  levothyroxine (SYNTHROID) tablet 100 mcg, 100 mcg, Oral, Weekly, Kyle, Tyrone A, DO, 100 mcg at 01/21/21 0541 .  morphine 4 MG/ML injection 4 mg, 4 mg, Intravenous, Q4H PRN, Kyle, Tyrone A, DO, 4 mg at 01/20/21 2136 .  ondansetron (ZOFRAN) tablet 4 mg, 4 mg, Oral, Q6H PRN **OR** ondansetron (ZOFRAN) injection 4 mg, 4 mg, Intravenous, Q6H PRN, Kyle, Tyrone A, DO .  polyethylene glycol (MIRALAX / GLYCOLAX) packet 34 g, 34 g, Oral, BID, Nelida Meuse III, MD, 34 g at 01/20/21 2129 .  potassium chloride SA (KLOR-CON) CR tablet 40 mEq, 40 mEq, Oral, BID, Blount, Xenia T, NP, 40 mEq at 01/21/21 0541  Allergy:  Allergies  Allergen Reactions  . Codeine Nausea Only    Social Hx:   Social History   Socioeconomic History  . Marital status: Married    Spouse name: Not on file  . Number of children: 2  . Years of education: Not on file  . Highest education level: Not on file  Occupational  History  . Occupation: Disabled  Tobacco Use  . Smoking status: Former Smoker    Years: 5.00    Types: Cigarettes    Quit date: 05/13/1982    Years since quitting: 38.7  . Smokeless tobacco: Never Used  Vaping Use  . Vaping Use: Never used  Substance and Sexual Activity  . Alcohol use: No  . Drug use: No  . Sexual activity: Not Currently  Other Topics Concern  . Not on file  Social History Narrative   Married and lives with husband   Has 2 children    Housewife   Disabled   2-3 cups of caffeine    R handed    Social Determinants of Health   Financial Resource Strain: Not on file  Food Insecurity: Not on file  Transportation Needs: Not on file  Physical Activity: Not on file  Stress: Not on file  Social Connections: Not on file  Intimate Partner Violence: Not on file    Past Surgical Hx:  Past Surgical History:  Procedure Laterality Date  . APPENDECTOMY    .  CAROTID ENDARTERECTOMY Right 03/28/2020  . CATARACT EXTRACTION W/ INTRAOCULAR LENS  IMPLANT, BILATERAL  2004  approx.  . CYSTOSCOPY WITH STENT PLACEMENT Right 05/04/2018   Procedure: CYSTOSCOPY WITH STENT PLACEMENT AND RETROGRADE PYELOGRAM;  Surgeon: Ceasar Mons, MD;  Location: [facility];  Service: Urology;  Laterality: Right;  . CYSTOSCOPY/URETEROSCOPY/HOLMIUM LASER/STENT PLACEMENT Right 05/19/2018   Procedure: CYSTOSCOPY, URETEROSCOPY/HOLMIUM LASER, STONE BASKETRY/ STENT EXCHANGE;  Surgeon: Ceasar Mons, MD;  Location: [facility];  Service: Urology;  Laterality: Right;  . ENDARTERECTOMY Right 03/28/2020   Procedure: RIGHT CAROTID ENDARTERECTOMY with PATCH ANGIOPLASTY;  Surgeon: Rosetta Posner, MD;  Location: [facility];  Service: Vascular;  Laterality: Right;  . EXCISION MASS LEFT CHEST WALL  09-20-2007   dr Grandville Silos  [facility]  . Radioactive Iodine Therapy     for thyroid  . REVISION TOTAL HIP ARTHROPLASTY Left early 2000s  . TANDEM RING INSERTION  1995   dr Aldean Ast @ [facility]   EUA  w/ tandem placement in ovid  (for direct high dose radiation brachytherapy , cervical cancer)  . TOTAL HIP ARTHROPLASTY Left 1990s  . TRANSTHORACIC ECHOCARDIOGRAM  08/07/2017   mild focal basal hypertrophy of the septum,  ef 01-00%, grade 1 diastolic dysfunction/  atrial septum with lipomatous hypertrophy/  trivial PR    Past Medical Hx:  Past Medical History:  Diagnosis Date  . Arthritis   . Basal ganglia stroke ([place])   . Bilateral lower extremity edema   . Cancer ([place])   . Carotid artery occlusion   . Flank pain   . Full dentures   . History of cervical cancer    11/ 1994  Stage IB  s/p  high dose radiation brachytherapy @ [facility] 01/ 1995--- per pt no recurrence  . History of chronic bronchitis   . History of hyperthyroidism    d 03/ 2011---due to grave's disease--- s/p  RAI treatment 04/ 2011  . History of kidney stones   . History of sepsis 05/03/2018   due to UTI with klebsiella/ pyelonephritis/ ureteral obstruction cause by stone  . Hyperlipidemia   . Hypothyroidism, postradioiodine therapy    endocrinologist--  dr Loanne Drilling--  dx graves disease and s/p RAI i131 treatement 4/ 2011  . Nauseated   . Oral thrush 05/03/2018  . Pneumonia   . Type 2 diabetes mellitus treated with insulin ([place])    FOLLOWED BY PCP  . Urgency of urination   . Wears glasses     Family Hx:  Family History  Problem Relation Age of Onset  . Emphysema Father   . Diabetes Father   . Emphysema Sister   . Emphysema Brother   . Cancer Mother        Skin Cancer  . Diabetes Mother   . Colon cancer Neg Hx   . Esophageal cancer Neg Hx   . Rectal cancer Neg Hx   . Stomach cancer Neg Hx    CBC    Component Value Date/Time   WBC 5.7 01/20/2021 0340   RBC 3.31 (L) 01/20/2021 0340   HGB 9.2 (L) 01/20/2021 0340   HCT 29.1 (L) 01/20/2021 0340   PLT 218 01/20/2021 0340   MCV 87.9 01/20/2021 0340   MCH 27.8 01/20/2021 0340   MCHC 31.6 01/20/2021 0340   RDW 13.3 01/20/2021 0340   LYMPHSABS 0.7  01/18/2021 1153   MONOABS 0.6 01/18/2021 1153   EOSABS 0.0 01/18/2021 1153   BASOSABS 0.0 01/18/2021 1153   CMP Latest Ref Rng &  Units 01/21/2021 01/20/2021 01/19/2021  Glucose 70 - 99 mg/dL 120(H) 118(H) 106(H)  BUN 8 - 23 mg/dL 9 13 12   Creatinine 0.44 - 1.00 mg/dL 0.86 0.97 1.08(H)  Sodium 135 - 145 mmol/L 141 144 144  Potassium 3.5 - 5.1 mmol/L 2.8(L) 3.3(L) 3.0(L)  Chloride 98 - 111 mmol/L 110 112(H) 110  CO2 22 - 32 mmol/L 21(L) 24 27  Calcium 8.9 - 10.3 mg/dL 8.1(L) 7.4(L) 7.7(L)  Total Protein 6.5 - 8.1 g/dL 6.1(L) 5.2(L) 5.6(L)  Total Bilirubin 0.3 - 1.2 mg/dL 0.4 0.4 0.5  Alkaline Phos 38 - 126 U/L 100 88 83  AST 15 - 41 U/L 17 14(L) 15  ALT 0 - 44 U/L 12 10 11    No growth on blood cultures from 01/18/2021.  Urine culture from 01/18/2021 growing >=100,000 colonies/mL citrobacter freundii  Vitals:  Blood pressure (!) 168/64, pulse 66, temperature 98.2 F (36.8 C), temperature source Oral, resp. rate 16, height 5' (1.524 m), weight 154 lb 12.2 oz (70.2 kg), SpO2 100 %.  Physical Exam:  Alert, oriented, in no acute distress. Daughter at the bedside. Patient using the bedside commode and able to transfer to bed without difficulty. Trace/minimal edema noted in the RLE.  Assessment/Plan: 75 year old female with a history of Stage IB cervical cancer in 1994 s/p primary chemoradiation currently admitted with bilateral hydronephrosis, UTI, and chronic constipation. Dr. Berline Lopes to see patient later today. Patient needs an examination under anesthesia with dilation and curettage of the uterus. Her current presentation seems unlikely related to her hematometra since it has not changed significantly over several years on imaging studies. Advised she should proceed with the colonoscopy planned for Wednesday.      Dorothyann Gibbs, NP 01/21/2021, 9:20 AM

## 2021-01-21 NOTE — Care Management Important Message (Signed)
Important Message  Patient Details IM Letter placed in Patient's door caddy. Name: [name] MRN: [id number] Date of Birth: 22-Aug-1946   Medicare Important Message Given:  Yes     Kerin Salen 01/21/2021, 12:31 PM

## 2021-01-21 NOTE — H&P (View-Only) (Signed)
Patient ID: [name], female   DOB: Sep 17, 1946, 75 y.o.   MRN: [id number]    Progress Note   Subjective   Day # 3  CC: Abdominal pain, bilateral hydronephrosis/UTI, constipation  Plavix on hold-day #4   Patient says she is feeling a bit better, no significant complaints of abdominal pain, Foley was removed this morning. Patient says the Merril Abbe has been working well and she has had multiple bowel movements over the weekend and is currently having liquid yellow stools.   Objective   Vital signs in last 24 hours: Temp:  [98.1 F (36.7 C)-98.2 F (36.8 C)] 98.2 F (36.8 C) (03/07 0430) Pulse Rate:  [60-73] 66 (03/07 0430) Resp:  [15-16] 16 (03/07 0430) BP: (143-168)/(54-64) 168/64 (03/07 0430) SpO2:  [98 %-100 %] 100 % (03/07 0430) Last BM Date: 01/20/21 General:    Elderly white female in NAD Heart:  Regular rate and rhythm; no murmurs Lungs: Respirations even and unlabored, lungs CTA bilaterally Abdomen:  Soft, nontender and nondistended. Normal bowel sounds. Extremities:  Without edema. Neurologic:  Alert and oriented,  grossly normal neurologically. Psych:  Cooperative. Normal mood and affect.  Intake/Output from previous day: 03/06 0701 - 03/07 0700 In: 540 [P.O.:240; IV Piggyback:300] Out: 1975 [[id number]:[id number]] Intake/Output this shift: No intake/output data recorded.  Lab Results: Recent Labs    01/18/21 1153 01/19/21 0347 01/20/21 0340  WBC 9.2 8.2 5.7  HGB 11.0* 10.1* 9.2*  HCT 34.6* 31.8* 29.1*  PLT 238 228 218   BMET Recent Labs    01/19/21 0347 01/20/21 0340 01/21/21 0409  NA 144 144 141  K 3.0* 3.3* 2.8*  CL 110 112* 110  CO2 27 24 21*  GLUCOSE 106* 118* 120*  BUN 12 13 9   CREATININE 1.08* 0.97 0.86  CALCIUM 7.7* 7.4* 8.1*   LFT Recent Labs    01/21/21 0409  PROT 6.1*  ALBUMIN 2.4*  AST 17  ALT 12  ALKPHOS 100  BILITOT 0.4   PT/INR No results for input(s): LABPROT, INR in the last 72 hours.  Studies/Results: US  RENAL  Result Date: 01/20/2021 CLINICAL DATA:  Hydronephrosis EXAM: RENAL / URINARY TRACT ULTRASOUND COMPLETE COMPARISON:  January 18, 2021 FINDINGS: Right Kidney: Renal measurements: 8.1 x 3.7 x 4.3 cm = volume: 68 mL. Mild diffuse cortical thinning. Echogenicity is within normal limits. There is mild hydronephrosis. Left Kidney: Renal measurements: 9.7 x 5.4 x 5.8 cm = volume: 158 mL. Echogenicity is within normal limits. There is mild hydronephrosis. Bladder: Completely decompressed around a Foley catheter. Other: None. IMPRESSION: There is mild bilateral hydronephrosis. Electronically Signed   By: Valentino Saxon MD   On: 01/20/2021 13:21       Assessment / Plan:    #66 75 year old white female with iron deficiency anemia, chronic lower abdominal pain and significant constipation who was admitted with fever and worsening lower abdominal pain. CT showed bilateral hydronephrosis and hydroureter, bladder wall thickening concerning for UTI. Large fluid collection in the endometrial cavity which appeared to be causing extrinsic compression of the sigmoid colon, however no proximal colonic dilation noted on imaging.  Ultrasound confirmed complex fluid-filled uterine cavity with question of obstruction/cervical stenosis Also diagnosed with UTI.-Foley had been placed, removed this morning.  GYN is to see today-patient does have history of cervical cancer, and is status post radiation  Patient had undergone outpatient EGD and colonoscopy per Dr. Tarri Glenn in February for evaluation of the iron deficiency anemia.  Colonoscopy was incomplete due to finding of  fecal impaction. No impaction on exam this admission.  She is having good results with twice daily MiraLAX.  #2 history of CVA-on chronic Plavix this is been on hold since Friday, 01/18/2021.  She is being covered with Lovenox currently  #3 adult onset diabetes mellitus #4.  Chronic neuropathy/chronic pain on chronic oxycodone  Plan; Patient is  scheduled for Colonoscopy on Wednesday morning 01/23/2021. Continue MiraLAX twice daily Bowel prep tomorrow Hold Lovenox dose tomorrow evening prior to planned colonoscopy on Wednesday morning    Active Problems:   UTI (urinary tract infection)   Lower abdominal pain   Non-intractable vomiting     LOS: 3 days   Amy EsterwoodPA-C  01/21/2021, 10:43 AM    Attending physician's note   I have taken an interval history, reviewed the chart and examined the patient. I agree with the Advanced Practitioner's note, impression and recommendations.   75 year old female with history of cervical cancer s/p resection and radiation, UTI, bilateral hydronephrosis with iron deficiency anemia No overt GI bleeding  Need to exclude radiation proctitis, neoplastic lesion or AVMs  Colonoscopy was inadequate due to poor prep in February 2022 . She is on chronic antiplatelet therapy with Plavix for history of CVA, last dose on Friday, March 4  We will plan for colonoscopy with extended bowel prep on Wednesday The risks and benefits as well as alternatives of endoscopic procedure(s) have been discussed and reviewed. All questions answered. The patient agrees to proceed.   I have spent 35 minutes of patient care (this includes precharting, chart review, review of results, face-to-face time used for counseling as well as treatment plan and follow-up. The patient was provided an opportunity to ask questions and all were answered. The patient agreed with the plan and demonstrated an understanding of the instructions.  Damaris Hippo , MD [phone]

## 2021-01-21 NOTE — Progress Notes (Addendum)
Patient ID: [name], female   DOB: 07-Aug-1946, 75 y.o.   MRN: [id number]    Progress Note   Subjective   Day # 3  CC: Abdominal pain, bilateral hydronephrosis/UTI, constipation  Plavix on hold-day #4   Patient says she is feeling a bit better, no significant complaints of abdominal pain, Foley was removed this morning. Patient says the Merril Abbe has been working well and she has had multiple bowel movements over the weekend and is currently having liquid yellow stools.   Objective   Vital signs in last 24 hours: Temp:  [98.1 F (36.7 C)-98.2 F (36.8 C)] 98.2 F (36.8 C) (03/07 0430) Pulse Rate:  [60-73] 66 (03/07 0430) Resp:  [15-16] 16 (03/07 0430) BP: (143-168)/(54-64) 168/64 (03/07 0430) SpO2:  [98 %-100 %] 100 % (03/07 0430) Last BM Date: 01/20/21 General:    Elderly white female in NAD Heart:  Regular rate and rhythm; no murmurs Lungs: Respirations even and unlabored, lungs CTA bilaterally Abdomen:  Soft, nontender and nondistended. Normal bowel sounds. Extremities:  Without edema. Neurologic:  Alert and oriented,  grossly normal neurologically. Psych:  Cooperative. Normal mood and affect.  Intake/Output from previous day: 03/06 0701 - 03/07 0700 In: 540 [P.O.:240; IV Piggyback:300] Out: 1975 [KYHCW:[id number]] Intake/Output this shift: No intake/output data recorded.  Lab Results: Recent Labs    01/18/21 1153 01/19/21 0347 01/20/21 0340  WBC 9.2 8.2 5.7  HGB 11.0* 10.1* 9.2*  HCT 34.6* 31.8* 29.1*  PLT 238 228 218   BMET Recent Labs    01/19/21 0347 01/20/21 0340 01/21/21 0409  NA 144 144 141  K 3.0* 3.3* 2.8*  CL 110 112* 110  CO2 27 24 21*  GLUCOSE 106* 118* 120*  BUN 12 13 9   CREATININE 1.08* 0.97 0.86  CALCIUM 7.7* 7.4* 8.1*   LFT Recent Labs    01/21/21 0409  PROT 6.1*  ALBUMIN 2.4*  AST 17  ALT 12  ALKPHOS 100  BILITOT 0.4   PT/INR No results for input(s): LABPROT, INR in the last 72 hours.  Studies/Results: US  RENAL  Result Date: 01/20/2021 CLINICAL DATA:  Hydronephrosis EXAM: RENAL / URINARY TRACT ULTRASOUND COMPLETE COMPARISON:  January 18, 2021 FINDINGS: Right Kidney: Renal measurements: 8.1 x 3.7 x 4.3 cm = volume: 68 mL. Mild diffuse cortical thinning. Echogenicity is within normal limits. There is mild hydronephrosis. Left Kidney: Renal measurements: 9.7 x 5.4 x 5.8 cm = volume: 158 mL. Echogenicity is within normal limits. There is mild hydronephrosis. Bladder: Completely decompressed around a Foley catheter. Other: None. IMPRESSION: There is mild bilateral hydronephrosis. Electronically Signed   By: Valentino Saxon MD   On: 01/20/2021 13:21       Assessment / Plan:    #86 75 year old white female with iron deficiency anemia, chronic lower abdominal pain and significant constipation who was admitted with fever and worsening lower abdominal pain. CT showed bilateral hydronephrosis and hydroureter, bladder wall thickening concerning for UTI. Large fluid collection in the endometrial cavity which appeared to be causing extrinsic compression of the sigmoid colon, however no proximal colonic dilation noted on imaging.  Ultrasound confirmed complex fluid-filled uterine cavity with question of obstruction/cervical stenosis Also diagnosed with UTI.-Foley had been placed, removed this morning.  GYN is to see today-patient does have history of cervical cancer, and is status post radiation  Patient had undergone outpatient EGD and colonoscopy per Dr. Tarri Glenn in February for evaluation of the iron deficiency anemia.  Colonoscopy was incomplete due to finding of  fecal impaction. No impaction on exam this admission.  She is having good results with twice daily MiraLAX.  #2 history of CVA-on chronic Plavix this is been on hold since Friday, 01/18/2021.  She is being covered with Lovenox currently  #3 adult onset diabetes mellitus #4.  Chronic neuropathy/chronic pain on chronic oxycodone  Plan; Patient is  scheduled for Colonoscopy on Wednesday morning 01/23/2021. Continue MiraLAX twice daily Bowel prep tomorrow Hold Lovenox dose tomorrow evening prior to planned colonoscopy on Wednesday morning    Active Problems:   UTI (urinary tract infection)   Lower abdominal pain   Non-intractable vomiting     LOS: 3 days   Amy EsterwoodPA-C  01/21/2021, 10:43 AM    Attending physician's note   I have taken an interval history, reviewed the chart and examined the patient. I agree with the Advanced Practitioner's note, impression and recommendations.   75 year old female with history of cervical cancer s/p resection and radiation, UTI, bilateral hydronephrosis with iron deficiency anemia No overt GI bleeding  Need to exclude radiation proctitis, neoplastic lesion or AVMs  Colonoscopy was inadequate due to poor prep in February 2022 . She is on chronic antiplatelet therapy with Plavix for history of CVA, last dose on Friday, March 4  We will plan for colonoscopy with extended bowel prep on Wednesday The risks and benefits as well as alternatives of endoscopic procedure(s) have been discussed and reviewed. All questions answered. The patient agrees to proceed.   I have spent 35 minutes of patient care (this includes precharting, chart review, review of results, face-to-face time used for counseling as well as treatment plan and follow-up. The patient was provided an opportunity to ask questions and all were answered. The patient agreed with the plan and demonstrated an understanding of the instructions.  Damaris Hippo , MD [phone]

## 2021-01-21 NOTE — Progress Notes (Signed)
Patient ID: [name], female   DOB: 10/30/1946, 75 y.o.   MRN: [id number]    Subjective: Pt continues to feel better.  Still no BM.  Objective: Vital signs in last 24 hours: Temp:  [98.1 F (36.7 C)-98.2 F (36.8 C)] 98.2 F (36.8 C) (03/07 0430) Pulse Rate:  [60-73] 66 (03/07 0430) Resp:  [15-16] 16 (03/07 0430) BP: (143-168)/(54-64) 168/64 (03/07 0430) SpO2:  [98 %-100 %] 100 % (03/07 0430)  Intake/Output from previous day: 03/06 0701 - 03/07 0700 In: 540 [P.O.:240; IV Piggyback:300] Out: 1975 [Urine:1975] Intake/Output this shift: No intake/output data recorded.  Physical Exam:  General: Alert and oriented Abd: No CVAT GU: Urine clear  Lab Results: Recent Labs    01/18/21 1153 01/19/21 0347 01/20/21 0340  HGB 11.0* 10.1* 9.2*  HCT 34.6* 31.8* 29.1*   CBC Latest Ref Rng & Units 01/20/2021 01/19/2021 01/18/2021  WBC 4.0 - 10.5 K/uL 5.7 8.2 9.2  Hemoglobin 12.0 - 15.0 g/dL 9.2(L) 10.1(L) 11.0(L)  Hematocrit 36.0 - 46.0 % 29.1(L) 31.8(L) 34.6(L)  Platelets 150 - 400 K/uL 218 228 238     BMET Recent Labs    01/20/21 0340 01/21/21 0409  NA 144 141  K 3.3* 2.8*  CL 112* 110  CO2 24 21*  GLUCOSE 118* 120*  BUN 13 9  CREATININE 0.97 0.86  CALCIUM 7.4* 8.1*     Studies/Results: US RENAL  Result Date: 01/20/2021 CLINICAL DATA:  Hydronephrosis EXAM: RENAL / URINARY TRACT ULTRASOUND COMPLETE COMPARISON:  January 18, 2021 FINDINGS: Right Kidney: Renal measurements: 8.1 x 3.7 x 4.3 cm = volume: 68 mL. Mild diffuse cortical thinning. Echogenicity is within normal limits. There is mild hydronephrosis. Left Kidney: Renal measurements: 9.7 x 5.4 x 5.8 cm = volume: 158 mL. Echogenicity is within normal limits. There is mild hydronephrosis. Bladder: Completely decompressed around a Foley catheter. Other: None. IMPRESSION: There is mild bilateral hydronephrosis. Electronically Signed   By: Valentino Saxon MD   On: 01/20/2021 13:21    Assessment/Plan: 1) Bilateral  hydronephrosis: This remains persistent but mild since catheter was placed.  Based on past imaging, this is likely chronic and non-obstructive considering renal function.  Will remove catheter today.  Will continue to hold oxybutynin as this may be contributing to poor bladder emptying and to constipation.  Will monitor PVRs to ensure catheter is not necessary. 2) UTI: Urine culture growing Citrobacter with sensitivities pending. Continue current antibiotic pending final results.   LOS: 3 days   Dutch Gray 01/21/2021, 7:29 AM

## 2021-01-21 NOTE — Progress Notes (Signed)
PROGRESS NOTE    [name]  NFA:[id number] DOB: [birth year] DOA: 01/18/2021 PCP: Everardo Beals, NP  Brief Narrative: [name] is a 75 y.o. female with medical history significant of cervical cancer, DM2, HTN, renal stones, chronic abdominal pain. Presenting with abdominal pain. History per daughter as patient is confused. Daughter reports that patient has had abdominal pain for several months. She has been followed by [facility] and has had c-scope and EGD performed. Her C-scope was not able to be completed due to fecal impaction. She was scheduled for a follow up c-scope next week. Her LLQ abdominal pain has been worsening over the last few days. This prompted a call to [facility] and that is when the c-scope was moved to next week. Last night, her daughter found the pain febrile with chills. Tempw as up to 103. She was nauseous and had some vomiting. The daughter called [facility] and it was recommended that the patient come to the ED. However, the patient refused. When the patient woke this morning, her symptoms continued and now included confusion. The family brought her to the ED for help. They deny any other aggravating or alleviating factors.   ED Course: CT found a large amount of stool in the colon. It also found a fluid collection in the pelvic cavity. This collection looks like it's causing hydronephrosis. Urology was consulted. Per EDPA, recommended foley and abx for UTI. [facility] was consulted. OBGyn was consulted. Transvaginal US ordered. [facility] called for admission.   Assessment & Plan:   Active Problems:   UTI (urinary tract infection)   Lower abdominal pain   Non-intractable vomiting    #1 Bilateral hydronephrosis? Etiology. seen by urology. Possible bilateral ureteral obstruction versus bladder outlet obstruction. Discussed with GYN who does not think this is caused by external compression of the endometrial fluid collection/massFoley has been placed continue. REPEAT RENAL US AFTER  FOLEY PLACED -MILD B/L HYDRONEPHROSIS IMPROVED FROM PRIOR.DR Alinda Money HAS REC TO DC FOLEY AND DC OXYBUTYNIN AS IT MAY contribute to urinary retension  #2 constipation/diarhea-patient has chronic constipation. GI following and has made changes to her stool softeners.she has had multiple BMS.  #3 endometrial fluid collection-  dw Dr Berline Lopes who will see patient today  #4 type 2 diabetes continue SSI and Lantus. CBG (last 3)  Recent Labs    01/21/21 0211 01/21/21 0756 01/21/21 1127  GLUCAP 160* 128* 98   Decrease lantus to 14 units 3/6  #5 AKI - resolved with IVF.creatinine 0.8 from  1.08 down from 1.11. Her baseline creatinine is around 0.8.  #6 hypothyroidism continue Synthroid  #7 hyperlipidemia continue statins  #8 history of stroke on statin and Plavix. Continue to hold Plavix for preparation for colonoscopy.  #9 iron deficiency anemia- GI planning colonoscopy 3/9  #10 hypokalemia-k 2.8 mag 2.0  replete and recheck labs in a.m.  #11 UTI more than 100,000 colonies of Citrobacter freundii resistant to Rocephin.  Changed to nitrofurantoin 01/21/2021.   Estimated body mass index is 30.23 kg/m as calculated from the following:   Height as of this encounter: 5' (1.524 m).   Weight as of this encounter: 70.2 kg.  DVT prophylaxis: Lovenox  code Status: Full code Family Communication: Discussed with daughter Disposition Plan:  Status is: Inpatient  Dispo: The patient is from: Home              Anticipated d/c is to: Home              Patient currently is  not medically stable to d/c.   Difficult to place patient na    Consultants: Gynecology, GI, urology  Procedures: None Antimicrobials: Rocephin Subjective:  Patient resting in bed she is awake alert  Answering all questions appropriately and following commands. She has had multiple bowel movements after stool softeners. Objective: Vitals:   01/20/21 0439 01/20/21 1306 01/20/21 2047 01/21/21 0430  BP: (!) 127/53 (!)  143/54 (!) 156/60 (!) 168/64  Pulse: 65 60 73 66  Resp: _0 Temp: 97.9 F (36.6 C) 98.1 F (36.7 C) 98.2 F (36.8 C) 98.2 F (36.8 C)  TempSrc: Oral Oral Oral Oral  SpO2: 97% 100% 98% 100%  Weight:      Height:        Intake/Output Summary (Last 24 hours) at 01/21/2021 1207 Last data filed at 01/21/2021 0500 Gross per 24 hour  Intake 420 ml  Output 1975 ml  Net -1555 ml   Filed Weights   01/19/21 1834  Weight: 70.2 kg    Examination:  General exam: Appears calm and comfortable  Respiratory system: Clear to auscultation. Respiratory effort normal. Cardiovascular system: S1 & S2 heard, RRR. No JVD, murmurs, rubs, gallops or clicks. No pedal edema. Gastrointestinal system: Abdomen is nondistended, soft and nontender. No organomegaly or masses felt. Normal bowel sounds heard. Central nervous system: Alert and oriented. No focal neurological deficits. Extremities: Symmetric 5 x 5 power. Skin: No rashes, lesions or ulcers Psychiatry: Judgement and insight appear normal. Mood & affect appropriate.     Data Reviewed: I have personally reviewed following labs and imaging studies  CBC: Recent Labs  Lab 01/18/21 1153 01/19/21 0347 01/20/21 0340  WBC 9.2 8.2 5.7  NEUTROABS 7.9*  --   --   HGB 11.0* 10.1* 9.2*  HCT 34.6* 31.8* 29.1*  MCV 86.9 87.8 87.9  PLT 238 228 086   Basic Metabolic Panel: Recent Labs  Lab 01/18/21 1034 01/19/21 0347 01/20/21 0340 01/21/21 0409  NA 137 144 144 141  K 3.5 3.0* 3.3* 2.8*  CL 101 110 112* 110  CO2 _1 21*  GLUCOSE 277* 106* 118* 120*  BUN _2 CREATININE 1.11* 1.08* 0.97 0.86  CALCIUM 8.3* 7.7* 7.4* 8.1*  MG  --  1.2*  --  2.0   GFR: Estimated Creatinine Clearance: 50.2 mL/min (by C-G formula based on SCr of 0.86 mg/dL). Liver Function Tests: Recent Labs  Lab 01/18/21 1034 01/19/21 0347 01/20/21 0340 01/21/21 0409  AST 23 15 14* 17  ALT _3 ALKPHOS 125 83 88 100  BILITOT 0.4 0.5 0.4 0.4   PROT 6.9 5.6* 5.2* 6.1*  ALBUMIN 2.8* 2.1* 2.0* 2.4*   Recent Labs  Lab 01/18/21 1034  LIPASE 29   No results for input(s): AMMONIA in the last 168 hours. Coagulation Profile: No results for input(s): INR, PROTIME in the last 168 hours. Cardiac Enzymes: No results for input(s): CKTOTAL, CKMB, CKMBINDEX, TROPONINI in the last 168 hours. BNP (last 3 results) No results for input(s): PROBNP in the last 8760 hours. HbA1C: Recent Labs    01/18/21 2010  HGBA1C 7.0*   CBG: Recent Labs  Lab 01/20/21 1619 01/20/21 2050 01/21/21 0211 01/21/21 0756 01/21/21 1127  GLUCAP 261* 165* 160* 128* 98   Lipid Profile: No results for input(s): CHOL, HDL, LDLCALC, TRIG, CHOLHDL, LDLDIRECT in the last 72 hours. Thyroid Function Tests: No results for input(s): TSH, T4TOTAL, FREET4, T3FREE, THYROIDAB in the last 72 hours.  Anemia Panel: No results for input(s): VITAMINB12, FOLATE, FERRITIN, TIBC, IRON, RETICCTPCT in the last 72 hours. Sepsis Labs: No results for input(s): PROCALCITON, LATICACIDVEN in the last 168 hours.  Recent Results (from the past 240 hour(s))  Urine culture     Status: Abnormal   Collection Time: 01/18/21  2:00 PM   Specimen: Urine, Random  Result Value Ref Range Status   Specimen Description   Final    URINE, RANDOM Performed at [facility] [address], [place], [place] [address]    Special Requests   Final    NONE Performed at [facility], [place] [address] [place], [place] [address]    Culture >=100,000 COLONIES/mL CITROBACTER FREUNDII (A)  Final   Report Status 01/21/2021 FINAL  Final   Organism ID, Bacteria CITROBACTER FREUNDII (A)  Final      Susceptibility   Citrobacter freundii - MIC*    CEFAZOLIN >=64 RESISTANT Resistant     CEFEPIME 1 SENSITIVE Sensitive     CEFTRIAXONE >=64 RESISTANT Resistant     CIPROFLOXACIN <=0.25 SENSITIVE Sensitive     GENTAMICIN <=1 SENSITIVE Sensitive     IMIPENEM 0.5 SENSITIVE  Sensitive     NITROFURANTOIN <=16 SENSITIVE Sensitive     TRIMETH/SULFA <=20 SENSITIVE Sensitive     PIP/TAZO 64 INTERMEDIATE Intermediate     * >=100,000 COLONIES/mL CITROBACTER FREUNDII  SARS CORONAVIRUS 2 (TAT 6-24 HRS) Nasopharyngeal Nasopharyngeal Swab     Status: None   Collection Time: 01/18/21  3:55 PM   Specimen: Nasopharyngeal Swab  Result Value Ref Range Status   SARS Coronavirus 2 NEGATIVE NEGATIVE Final    Comment: (NOTE) SARS-CoV-2 target nucleic acids are NOT DETECTED.  The SARS-CoV-2 RNA is generally detectable in upper and lower respiratory specimens during the acute phase of infection. Negative results do not preclude SARS-CoV-2 infection, do not rule out co-infections with other pathogens, and should not be used as the sole basis for treatment or other patient management decisions. Negative results must be combined with clinical observations, patient history, and epidemiological information. The expected result is Negative.  Fact Sheet for Patients: SugarRoll.be  Fact Sheet for Healthcare Providers: https://www.woods-.com/  This test is not yet approved or cleared by the [place] FDA and  has been authorized for detection and/or diagnosis of SARS-CoV-2 by FDA under an Emergency Use Authorization (EUA). This EUA will remain  in effect (meaning this test can be used) for the duration of the COVID-19 declaration under Se ction 564(b)(1) of the Act, 21 U.S.C. section 360bbb-3(b)(1), unless the authorization is terminated or revoked sooner.  Performed at [facility], [place] [address] [place], [place] [address]   Blood culture (routine x 2)     Status: None (Preliminary result)   Collection Time: 01/18/21  6:30 PM   Specimen: BLOOD LEFT HAND  Result Value Ref Range Status   Specimen Description   Final    BLOOD LEFT HAND Performed at [facility] [address] [place], [place]  [address]    Special Requests   Final    BOTTLES DRAWN AEROBIC AND ANAEROBIC Blood Culture results may not be optimal due to an inadequate volume of blood received in culture bottles Performed at [facility] [address], [place], [place] [address]    Culture   Final    NO GROWTH 3 DAYS Performed at [facility], [place] [address], [place], [place] [address]    Report  Status PENDING  Incomplete  C Difficile Quick Screen w PCR reflex     Status: Abnormal   Collection Time: 01/18/21  7:00 PM   Specimen: STOOL  Result Value Ref Range Status   C Diff antigen POSITIVE (A) NEGATIVE Final   C Diff toxin NEGATIVE NEGATIVE Final   C Diff interpretation Results are indeterminate. See PCR results.  Final    Comment: Performed at [facility], [place] [address], [place], [place] [address]  Gastrointestinal Panel by PCR , Stool     Status: None   Collection Time: 01/18/21  7:00 PM   Specimen: STOOL  Result Value Ref Range Status   Campylobacter species NOT DETECTED NOT DETECTED Final   Plesimonas shigelloides NOT DETECTED NOT DETECTED Final   Salmonella species NOT DETECTED NOT DETECTED Final   Yersinia enterocolitica NOT DETECTED NOT DETECTED Final   Vibrio species NOT DETECTED NOT DETECTED Final   Vibrio cholerae NOT DETECTED NOT DETECTED Final   Enteroaggregative E coli (EAEC) NOT DETECTED NOT DETECTED Final   Enteropathogenic E coli (EPEC) NOT DETECTED NOT DETECTED Final   Enterotoxigenic E coli (ETEC) NOT DETECTED NOT DETECTED Final   Shiga like toxin producing E coli (STEC) NOT DETECTED NOT DETECTED Final   Shigella/Enteroinvasive E coli (EIEC) NOT DETECTED NOT DETECTED Final   Cryptosporidium NOT DETECTED NOT DETECTED Final   Cyclospora cayetanensis NOT DETECTED NOT DETECTED Final   Entamoeba histolytica NOT DETECTED NOT DETECTED Final   Giardia lamblia NOT DETECTED NOT DETECTED Final   Adenovirus F40/41 NOT DETECTED NOT DETECTED Final    Astrovirus NOT DETECTED NOT DETECTED Final   Norovirus GI/GII NOT DETECTED NOT DETECTED Final   Rotavirus A NOT DETECTED NOT DETECTED Final   Sapovirus (I, II, IV, and V) NOT DETECTED NOT DETECTED Final    Comment: Performed at [facility], [place] [place], [place] [address]  Blood culture (routine x 2)     Status: None (Preliminary result)   Collection Time: 01/18/21  8:10 PM   Specimen: BLOOD LEFT HAND  Result Value Ref Range Status   Specimen Description   Final    BLOOD LEFT HAND Performed at [facility], [place] [address], [place], [place] [address]    Special Requests   Final    BOTTLES DRAWN AEROBIC AND ANAEROBIC Blood Culture results may not be optimal due to an inadequate volume of blood received in culture bottles Performed at [facility] [address] [place], [place] [address]    Culture   Final    NO GROWTH 3 DAYS Performed at [facility], [place] [address], [place], [place] [address]    Report Status PENDING  Incomplete         Radiology Studies: US RENAL  Result Date: 01/20/2021 CLINICAL DATA:  Hydronephrosis EXAM: RENAL / URINARY TRACT ULTRASOUND COMPLETE COMPARISON:  January 18, 2021 FINDINGS: Right Kidney: Renal measurements: 8.1 x 3.7 x 4.3 cm = volume: 68 mL. Mild diffuse cortical thinning. Echogenicity is within normal limits. There is mild hydronephrosis. Left Kidney: Renal measurements: 9.7 x 5.4 x 5.8 cm = volume: 158 mL. Echogenicity is within normal limits. There is mild hydronephrosis. Bladder: Completely decompressed around a Foley catheter. Other: None. IMPRESSION: There is mild bilateral hydronephrosis. Electronically Signed   By: Valentino Saxon MD   On: 01/20/2021 13:21        Scheduled Meds: . atorvastatin  40 mg Oral QHS  . Chlorhexidine Gluconate  Cloth  6 each Topical Daily  . enoxaparin (LOVENOX) injection  40 mg Subcutaneous Q24H  . [START ON 01/23/2021] enoxaparin (LOVENOX)  injection  40 mg Subcutaneous Q24H  . gemfibrozil  600 mg Oral BID  . insulin aspart  0-15 Units Subcutaneous TID WC  . insulin aspart  0-5 Units Subcutaneous QHS  . insulin glargine  14 Units Subcutaneous QHS  . levothyroxine  100 mcg Oral Weekly  . nitrofurantoin (macrocrystal-monohydrate)  100 mg Oral Q12H  . [START ON 01/22/2021] peg 3350 powder  0.5 kit Oral BID  . polyethylene glycol  34 g Oral BID  . potassium chloride  40 mEq Oral BID   Continuous Infusions:    LOS: 3 days   Georgette Shell, MD  01/21/2021, 12:07 PM

## 2021-01-21 NOTE — Evaluation (Signed)
Physical Therapy Evaluation Patient Details Name: [name] MRN: [id number] DOB: [birth year] Today's Date: 01/21/2021   History of Present Illness  75 y.o. female with medical history significant of cervical cancer, DM2, HTN, renal stones, chronic abdominal pain. Presenting with abdominal pain, fever, confusion, nausea/vomiting. Dx of B hydronephrosis, UTI, AKI, hypokalemia.  Clinical Impression  Pt admitted with above diagnosis. Pt ambulated 24' with single point cane, with mild loss of balance x 1 during a turn requiring min A to regain balance. Pt had turned quickly to get to the bedside commode because she'd started urinating. She denies any falls at home in the past 1 year. She ambulates with a cane independently at baseline.  currently with functional limitations due to the deficits listed below (see PT Problem List). Pt will benefit from skilled PT to increase their independence and safety with mobility to allow discharge to the venue listed below.       Follow Up Recommendations Home health PT    Equipment Recommendations  None recommended by PT    Recommendations for Other Services       Precautions / Restrictions Precautions Precautions: Fall Precaution Comments: pt denies falls in past 1 year, but unsteady on eval Restrictions Weight Bearing Restrictions: No      Mobility  Bed Mobility Overal bed mobility: Modified Independent             General bed mobility comments: used bedrail    Transfers Overall transfer level: Needs assistance Equipment used: Straight cane Transfers: Sit to/from Stand;Stand Pivot Transfers Sit to Stand: Min guard Stand pivot transfers: Min guard       General transfer comment: min/guard for safety, SPT to bedside commode  Ambulation/Gait Ambulation/Gait assistance: Min assist Gait Distance (Feet): 24 Feet Assistive device: 1 person hand held assist;Straight cane Gait Pattern/deviations: Step-through pattern;Decreased  stride length Gait velocity: WFL   General Gait Details: unsteady with making a 180* turn, mild LOB requiring min A to regain balance (pt had begun to urinate on herself and made a quick turn to walk to the bedside commode)  Stairs            Wheelchair Mobility    Modified Rankin (Stroke Patients Only)       Balance Overall balance assessment: Needs assistance   Sitting balance-Leahy Scale: Good       Standing balance-Leahy Scale: Fair Standing balance comment: LOB with 180* turn                             Pertinent Vitals/Pain Pain Assessment: No/denies pain    Home Living Family/patient expects to be discharged to:: Private residence Living Arrangements: Spouse/significant other Available Help at Discharge: Family;Available 24 hours/day Type of Home: House Home Access: Ramped entrance     Home Layout: One level Home Equipment: Clinical cytogeneticist - 2 wheels;Cane - single point;Bedside commode      Prior Function Level of Independence: Independent with assistive device(s)         Comments: walks with SPC     Hand Dominance        Extremity/Trunk Assessment   Upper Extremity Assessment Upper Extremity Assessment: Overall WFL for tasks assessed    Lower Extremity Assessment Lower Extremity Assessment: Overall WFL for tasks assessed    Cervical / Trunk Assessment Cervical / Trunk Assessment: Normal  Communication   Communication: No difficulties  Cognition Arousal/Alertness: Awake/alert Behavior During Therapy: WFL for tasks assessed/performed Overall  Cognitive Status: Within Functional Limits for tasks assessed                                        General Comments      Exercises     Assessment/Plan    PT Assessment Patient needs continued PT services  PT Problem List Decreased mobility;Decreased balance;Decreased activity tolerance       PT Treatment Interventions Gait training;Therapeutic  activities;Therapeutic exercise    PT Goals (Current goals can be found in the Care Plan section)  Acute Rehab PT Goals Patient Stated Goal: return home PT Goal Formulation: With patient Time For Goal Achievement: 02/04/21 Potential to Achieve Goals: Good    Frequency Min 3X/week   Barriers to discharge        Co-evaluation               AM-PAC PT "6 Clicks" Mobility  Outcome Measure Help needed turning from your back to your side while in a flat bed without using bedrails?: None Help needed moving from lying on your back to sitting on the side of a flat bed without using bedrails?: None Help needed moving to and from a bed to a chair (including a wheelchair)?: A Little Help needed standing up from a chair using your arms (e.g., wheelchair or bedside chair)?: None Help needed to walk in hospital room?: A Little Help needed climbing 3-5 steps with a railing? : A Little 6 Click Score: 21    End of Session Equipment Utilized During Treatment: Gait belt Activity Tolerance: Patient tolerated treatment well Patient left: in chair;with call bell/phone within reach;with chair alarm set Nurse Communication: Mobility status PT Visit Diagnosis: Difficulty in walking, not elsewhere classified (R26.2)    Time: 0017-4944 PT Time Calculation (min) (ACUTE ONLY): 11 min   Charges:   PT Evaluation $PT Eval Low Complexity: 1 Low          [name] PT 01/21/2021  Acute Rehabilitation Services Pager [phone] Office [phone]

## 2021-01-22 ENCOUNTER — Telehealth: Payer: Self-pay | Admitting: Gastroenterology

## 2021-01-22 DIAGNOSIS — N1330 Unspecified hydronephrosis: Secondary | ICD-10-CM

## 2021-01-22 DIAGNOSIS — R1030 Lower abdominal pain, unspecified: Secondary | ICD-10-CM | POA: Diagnosis not present

## 2021-01-22 LAB — CBC
HCT: 35.5 % — ABNORMAL LOW (ref 36.0–46.0)
Hemoglobin: 10.9 g/dL — ABNORMAL LOW (ref 12.0–15.0)
MCH: 27.3 pg (ref 26.0–34.0)
MCHC: 30.7 g/dL (ref 30.0–36.0)
MCV: 89 fL (ref 80.0–100.0)
Platelets: 264 10*3/uL (ref 150–400)
RBC: 3.99 MIL/uL (ref 3.87–5.11)
RDW: 13.2 % (ref 11.5–15.5)
WBC: 4.7 10*3/uL (ref 4.0–10.5)
nRBC: 0 % (ref 0.0–0.2)

## 2021-01-22 LAB — COMPREHENSIVE METABOLIC PANEL
ALT: 14 U/L (ref 0–44)
AST: 19 U/L (ref 15–41)
Albumin: 2.8 g/dL — ABNORMAL LOW (ref 3.5–5.0)
Alkaline Phosphatase: 105 U/L (ref 38–126)
Anion gap: 8 (ref 5–15)
BUN: 6 mg/dL — ABNORMAL LOW (ref 8–23)
CO2: 20 mmol/L — ABNORMAL LOW (ref 22–32)
Calcium: 8.5 mg/dL — ABNORMAL LOW (ref 8.9–10.3)
Chloride: 113 mmol/L — ABNORMAL HIGH (ref 98–111)
Creatinine, Ser: 0.8 mg/dL (ref 0.44–1.00)
GFR, Estimated: >60 mL/min (ref >60)
Glucose, Bld: 92 mg/dL (ref 70–99)
Potassium: 3.8 mmol/L (ref 3.5–5.1)
Sodium: 141 mmol/L (ref 135–145)
Total Bilirubin: 0.5 mg/dL (ref 0.3–1.2)
Total Protein: 6.5 g/dL (ref 6.5–8.1)

## 2021-01-22 LAB — GLUCOSE, CAPILLARY
Glucose-Capillary: 90 mg/dL (ref 70–99)
Glucose-Capillary: 170 mg/dL — ABNORMAL HIGH (ref 70–99)
Glucose-Capillary: 97 mg/dL (ref 70–99)

## 2021-01-22 LAB — MAGNESIUM: Magnesium: 1.5 mg/dL — ABNORMAL LOW (ref 1.7–2.4)

## 2021-01-22 MED ORDER — HYDRALAZINE HCL 25 MG PO TABS: 25.0000 mg | ORAL_TABLET | Freq: Three times a day (TID) | ORAL | Status: DC | PRN

## 2021-01-22 MED ORDER — MAGNESIUM SULFATE 2 GM/50ML IV SOLN
2.0000 g | Freq: Once | INTRAVENOUS | Status: AC
  Administered 2021-01-22: 2 g via INTRAVENOUS
  Filled 2021-01-22: qty 50

## 2021-01-22 MED ORDER — SODIUM CHLORIDE 0.9% FLUSH: 10.0000 mL | INTRAVENOUS | Status: DC | PRN

## 2021-01-22 MED ORDER — SODIUM CHLORIDE 0.9% FLUSH
10.0000 mL | Freq: Two times a day (BID) | INTRAVENOUS | Status: DC
  Administered 2021-01-22: 10 mL

## 2021-01-22 NOTE — Telephone Encounter (Signed)
Thank you for the update!

## 2021-01-22 NOTE — Progress Notes (Signed)
Patient ID: [name], female   DOB: Jan 02, 1946, 75 y.o.   MRN: [id number]    Subjective: Pt doing well since Foley removed.  Voiding normally and without incontinence.  PVRs yesterday ranged from 35 cc to 267 cc.    Objective: Vital signs in last 24 hours: Temp:  [98 F (36.7 C)-98.5 F (36.9 C)] 98 F (36.7 C) (03/08 0342) Pulse Rate:  [70] 70 (03/08 0342) Resp:  [16-20] 20 (03/08 0342) BP: (154-181)/(60-77) 154/75 (03/08 0342) SpO2:  [99 %-100 %] 100 % (03/08 0342)  Intake/Output from previous day: 03/07 0701 - 03/08 0700 In: 50 [IV Piggyback:50] Out: -  Intake/Output this shift: No intake/output data recorded.  Physical Exam:  General: Alert and oriented   Lab Results: Recent Labs    01/20/21 0340 01/22/21 0410  HGB 9.2* 10.9*  HCT 29.1* 35.5*   BMET Recent Labs    01/21/21 0409 01/22/21 0410  NA 141 141  K 2.8* 3.8  CL 110 113*  CO2 21* 20*  GLUCOSE 120* 92  BUN 9 6*  CREATININE 0.86 0.80  CALCIUM 8.1* 8.5*     Studies/Results: US RENAL  Result Date: 01/20/2021 CLINICAL DATA:  Hydronephrosis EXAM: RENAL / URINARY TRACT ULTRASOUND COMPLETE COMPARISON:  January 18, 2021 FINDINGS: Right Kidney: Renal measurements: 8.1 x 3.7 x 4.3 cm = volume: 68 mL. Mild diffuse cortical thinning. Echogenicity is within normal limits. There is mild hydronephrosis. Left Kidney: Renal measurements: 9.7 x 5.4 x 5.8 cm = volume: 158 mL. Echogenicity is within normal limits. There is mild hydronephrosis. Bladder: Completely decompressed around a Foley catheter. Other: None. IMPRESSION: There is mild bilateral hydronephrosis. Electronically Signed   By: Valentino Saxon MD   On: 01/20/2021 13:21   Urine culture: Citrobacter sensitive to TMP/SMX, nitrofurantoin, and ciprofloxacin.  Assessment/Plan: 1) Bilateral hydronephrosis: This is likely chronic and non-obstructive considering past imaging and normal renal function.  Voiding adequately since catheter removed.  Would hold  oxybutynin for now and upon discharge considering possibility of periodic poor bladder emptying and constipation. 2) UTI: Continue culture specific antibiotics for 7 days considering severity of infection and can then resume usual prophylaxis.  Will have her f/u with her primary urologist, Dr. Lovena Neighbours, for further outpatient evaluation and management of recurrent UTIs.  May benefit from vaginal estrogen, cranberry supplementation, etc.  Please call if further questions.   LOS: 4 days   Dutch Gray 01/22/2021, 7:48 AM

## 2021-01-22 NOTE — Telephone Encounter (Signed)
Good Morning Dr. Tarri Glenn,   Just wanted to let you know I cancelled the procedure for 01-25-21 for this patient. Patients  is in hospital and will be having the procedure done tomorrow with Dr. Silverio Decamp.

## 2021-01-22 NOTE — Progress Notes (Signed)
PROGRESS NOTE    [name]  IRJ:[id number] DOB: Sep 01, 1946 DOA: 01/18/2021 PCP: Everardo Beals, NP    Chief Complaint  Patient presents with  . Fecal Impaction  . Emesis    Brief Narrative:   75 year old lady with history of cervical cancer, diabetes hypertension, chronic abdominal pain presents to ED with worsening abdominal pain.  She was seen outpatient by  gastroenterology underwent an EGD.  She was scheduled to get a colonoscopy but as her pain worsened she presented to ED for further evaluation.  CT of the abdomen pelvis showed large amount of stool in the colon and fluid collection in the pelvic cavity.  GI, urology, GYN oncology on board consulted.  Assessment & Plan:   Active Problems:   UTI (urinary tract infection)   Lower abdominal pain   Non-intractable vomiting   Hydronephrosis   Iron deficiency anemia due to chronic blood loss   Bilateral hydronephrosis secondary to bilateral ureteral obstruction , as per urology likely chronic and nonobstructive.  Secondary to large fluid collection in the endometrial cavity which appears to be causing extrinsic compression of the sigmoid colon and the ureters. Urology recommends antibiotics for 1 week and outpatient follow-up.   Severe constipation Resolved patient is having liquid yellow stools. She is scheduled for colonoscopy tomorrow.    CVA On Plavix which has been held since Friday.   Large fluid collection in the endometrial cavity causing extrinsic compression of the sigmoid colon and possibly ureters in the setting of history of cervical cancer. GYN oncology recommends D&C under ultrasound guidance to decompress and evacuate the endometrial cavity and take biopsies were appropriate.  She is scheduled for the procedure tomorrow in the afternoon.    Iron deficiency anemia Recommend iron supplementation on discharge.   Hypertension:  Not well controlled. Added hydralazine 25 mg PRN.     Hyperlipidemia:  Continue with lipitor.     DVT prophylaxis: (scd's Code Status:  Full code.  Family Communication: none at bedside.  Disposition:   Status is: Inpatient  Remains inpatient appropriate because:Ongoing diagnostic testing needed not appropriate for outpatient work up and IV treatments appropriate due to intensity of illness or inability to take PO   Dispo: The patient is from: Home              Anticipated d/c is to: pending.              Patient currently is not medically stable to d/c.   Difficult to place patient No       Consultants:   gyn oncology UROLOGY GI.   Procedures NONE.  Antimicrobials:NONE.   Subjective: NO NEW COMPLAINTS.   Objective: Vitals:   01/21/21 1343 01/21/21 2050 01/22/21 0342 01/22/21 1345  BP: (!) 167/60 (!) 181/77 (!) 154/75 (!) 181/65  Pulse: 70 70 70 67  Resp: _0 Temp: 98.4 F (36.9 C) 98.5 F (36.9 C) 98 F (36.7 C) 98.2 F (36.8 C)  TempSrc: Oral   Oral  SpO2: 100% 99% 100% 99%  Weight:      Height:        Intake/Output Summary (Last 24 hours) at 01/22/2021 1726 Last data filed at 01/22/2021 0600 Gross per 24 hour  Intake 50 ml  Output -  Net 50 ml   Filed Weights   01/19/21 1834  Weight: 70.2 kg    Examination:  General exam: Appears calm and comfortable  Respiratory system: Clear to auscultation. Respiratory effort normal. Cardiovascular system:  S1 & S2 heard, RRR. No JVD, murmurs, rubs, gallops or clicks. No pedal edema. Gastrointestinal system: Abdomen is nondistended, soft and nontender. No organomegaly or masses felt. Normal bowel sounds heard. Central nervous system: Alert and oriented. No focal neurological deficits. Extremities: Symmetric 5 x 5 power. Skin: No rashes, lesions or ulcers Psychiatry: Judgement and insight appear normal. Mood & affect appropriate.     Data Reviewed: I have personally reviewed following labs and imaging studies  CBC: Recent Labs  Lab  01/18/21 1153 01/19/21 0347 01/20/21 0340 01/22/21 0410  WBC 9.2 8.2 5.7 4.7  NEUTROABS 7.9*  --   --   --   HGB 11.0* 10.1* 9.2* 10.9*  HCT 34.6* 31.8* 29.1* 35.5*  MCV 86.9 87.8 87.9 89.0  PLT 238 228 218 161    Basic Metabolic Panel: Recent Labs  Lab 01/18/21 1034 01/19/21 0347 01/20/21 0340 01/21/21 0409 01/22/21 0410  NA 137 144 144 141 141  K 3.5 3.0* 3.3* 2.8* 3.8  CL 101 110 112* 110 113*  CO2 _0 21* 20*  GLUCOSE 277* 106* 118* 120* 92  BUN _1 6*  CREATININE 1.11* 1.08* 0.97 0.86 0.80  CALCIUM 8.3* 7.7* 7.4* 8.1* 8.5*  MG  --  1.2*  --  2.0 1.5*    GFR: Estimated Creatinine Clearance: 54 mL/min (by C-G formula based on SCr of 0.8 mg/dL).  Liver Function Tests: Recent Labs  Lab 01/18/21 1034 01/19/21 0347 01/20/21 0340 01/21/21 0409 01/22/21 0410  AST 23 15 14* 17 19  ALT _2 ALKPHOS 125 83 88 100 105  BILITOT 0.4 0.5 0.4 0.4 0.5  PROT 6.9 5.6* 5.2* 6.1* 6.5  ALBUMIN 2.8* 2.1* 2.0* 2.4* 2.8*    CBG: Recent Labs  Lab 01/21/21 1658 01/21/21 2051 01/22/21 0735 01/22/21 1116 01/22/21 1616  GLUCAP 180* 140* 90 170* 97     Recent Results (from the past 240 hour(s))  Urine culture     Status: Abnormal   Collection Time: 01/18/21  2:00 PM   Specimen: Urine, Random  Result Value Ref Range Status   Specimen Description   Final    URINE, RANDOM Performed at [facility], [place] [address], [place], [place] [address]    Special Requests   Final    NONE Performed at [facility], [place] [address] [place], [place] [address]    Culture >=100,000 COLONIES/mL CITROBACTER FREUNDII (A)  Final   Report Status 01/21/2021 FINAL  Final   Organism ID, Bacteria CITROBACTER FREUNDII (A)  Final      Susceptibility   Citrobacter freundii - MIC*    CEFAZOLIN >=64 RESISTANT Resistant     CEFEPIME 1 SENSITIVE Sensitive     CEFTRIAXONE >=64 RESISTANT Resistant     CIPROFLOXACIN <=0.25 SENSITIVE  Sensitive     GENTAMICIN <=1 SENSITIVE Sensitive     IMIPENEM 0.5 SENSITIVE Sensitive     NITROFURANTOIN <=16 SENSITIVE Sensitive     TRIMETH/SULFA <=20 SENSITIVE Sensitive     PIP/TAZO 64 INTERMEDIATE Intermediate     * >=100,000 COLONIES/mL CITROBACTER FREUNDII  SARS CORONAVIRUS 2 (TAT 6-24 HRS) Nasopharyngeal Nasopharyngeal Swab     Status: None   Collection Time: 01/18/21  3:55 PM   Specimen: Nasopharyngeal Swab  Result Value Ref Range Status   SARS Coronavirus 2 NEGATIVE NEGATIVE Final    Comment: (NOTE) SARS-CoV-2 target nucleic acids are NOT DETECTED.  The SARS-CoV-2 RNA is generally detectable in  upper and lower respiratory specimens during the acute phase of infection. Negative results do not preclude SARS-CoV-2 infection, do not rule out co-infections with other pathogens, and should not be used as the sole basis for treatment or other patient management decisions. Negative results must be combined with clinical observations, patient history, and epidemiological information. The expected result is Negative.  Fact Sheet for Patients: SugarRoll.be  Fact Sheet for Healthcare Providers: https://www.woods-mathews.com/  This test is not yet approved or cleared by the [place] FDA and  has been authorized for detection and/or diagnosis of SARS-CoV-2 by FDA under an Emergency Use Authorization (EUA). This EUA will remain  in effect (meaning this test can be used) for the duration of the COVID-19 declaration under Se ction 564(b)(1) of the Act, 21 U.S.C. section 360bbb-3(b)(1), unless the authorization is terminated or revoked sooner.  Performed at [facility], [place] [address] [place], [place] [address]   Blood culture (routine x 2)     Status: None (Preliminary result)   Collection Time: 01/18/21  6:30 PM   Specimen: BLOOD LEFT HAND  Result Value Ref Range Status   Specimen Description   Final    BLOOD LEFT  HAND Performed at [place] [address] [place], [place] [address]    Special Requests   Final    BOTTLES DRAWN AEROBIC AND ANAEROBIC Blood Culture results may not be optimal due to an inadequate volume of blood received in culture bottles Performed at [place] [address], [place], [place] [address]    Culture   Final    NO GROWTH 4 DAYS Performed at [facility], [place] [address], [place], [place] [address]    Report Status PENDING  Incomplete  C Difficile Quick Screen w PCR reflex     Status: Abnormal   Collection Time: 01/18/21  7:00 PM   Specimen: STOOL  Result Value Ref Range Status   C Diff antigen POSITIVE (A) NEGATIVE Final   C Diff toxin NEGATIVE NEGATIVE Final   C Diff interpretation Results are indeterminate. See PCR results.  Final    Comment: Performed at [facility], [place] [address] [place], [place] [address]  Gastrointestinal Panel by PCR , Stool     Status: None   Collection Time: 01/18/21  7:00 PM   Specimen: STOOL  Result Value Ref Range Status   Campylobacter species NOT DETECTED NOT DETECTED Final   Plesimonas shigelloides NOT DETECTED NOT DETECTED Final   Salmonella species NOT DETECTED NOT DETECTED Final   Yersinia enterocolitica NOT DETECTED NOT DETECTED Final   Vibrio species NOT DETECTED NOT DETECTED Final   Vibrio cholerae NOT DETECTED NOT DETECTED Final   Enteroaggregative E coli (EAEC) NOT DETECTED NOT DETECTED Final   Enteropathogenic E coli (EPEC) NOT DETECTED NOT DETECTED Final   Enterotoxigenic E coli (ETEC) NOT DETECTED NOT DETECTED Final   Shiga like toxin producing E coli (STEC) NOT DETECTED NOT DETECTED Final   Shigella/Enteroinvasive E coli (EIEC) NOT DETECTED NOT DETECTED Final   Cryptosporidium NOT DETECTED NOT DETECTED Final   Cyclospora cayetanensis NOT DETECTED NOT DETECTED Final   Entamoeba histolytica NOT DETECTED NOT DETECTED Final   Giardia lamblia NOT  DETECTED NOT DETECTED Final   Adenovirus F40/41 NOT DETECTED NOT DETECTED Final   Astrovirus NOT DETECTED NOT DETECTED Final   Norovirus GI/GII NOT DETECTED NOT DETECTED Final   Rotavirus A NOT DETECTED NOT DETECTED Final   Sapovirus (I,  II, IV, and V) NOT DETECTED NOT DETECTED Final    Comment: Performed at [facility], [place] [place], [place] [address]  Blood culture (routine x 2)     Status: None (Preliminary result)   Collection Time: 01/18/21  8:10 PM   Specimen: BLOOD LEFT HAND  Result Value Ref Range Status   Specimen Description   Final    BLOOD LEFT HAND Performed at [place] [address], [place], [place] [address]    Special Requests   Final    BOTTLES DRAWN AEROBIC AND ANAEROBIC Blood Culture results may not be optimal due to an inadequate volume of blood received in culture bottles Performed at [place] [address], [place], [place] [address]    Culture   Final    NO GROWTH 4 DAYS Performed at [facility], [place] [address], [place], [place] [address]    Report Status PENDING  Incomplete         Radiology Studies: No results found.      Scheduled Meds: . atorvastatin  40 mg Oral QHS  . Chlorhexidine Gluconate Cloth  6 each Topical Daily  . [START ON 01/23/2021] enoxaparin (LOVENOX) injection  40 mg Subcutaneous Q24H  . gemfibrozil  600 mg Oral BID  . insulin aspart  0-15 Units Subcutaneous TID WC  . insulin aspart  0-5 Units Subcutaneous QHS  . insulin glargine  14 Units Subcutaneous QHS  . levothyroxine  100 mcg Oral Weekly  . nitrofurantoin (macrocrystal-monohydrate)  100 mg Oral Q12H  . peg 3350 powder  0.5 kit Oral BID  . polyethylene glycol  34 g Oral BID   Continuous Infusions:   LOS: 4 days        Hosie Poisson, MD [facility]   To contact the attending provider between 7A-7P or the covering provider during after hours 7P-7A, please log into the web site  [email or web address] and access using universal [employer] password for that web site. If you do not have the password, please call the hospital operator.  01/22/2021, 5:26 PM

## 2021-01-22 NOTE — Anesthesia Preprocedure Evaluation (Addendum)
Anesthesia Evaluation  Patient identified by MRN, date of birth, ID band Patient awake    Reviewed: Allergy & Precautions, NPO status , Patient's Chart, lab work & pertinent test results  History of Anesthesia Complications Negative for: history of anesthetic complications  Airway Mallampati: III  TM Distance: >3 FB Neck ROM: Full    Dental  (+) Edentulous Upper, Edentulous Lower   Pulmonary former smoker,    Pulmonary exam normal        Cardiovascular hypertension, Normal cardiovascular exam     Neuro/Psych CVA (right leg weakness, speech dysfluency), Residual Symptoms negative psych ROS   GI/Hepatic Neg liver ROS, GERD  Controlled and Medicated,  Endo/Other  diabetes, Type 2, Oral Hypoglycemic Agents, Insulin DependentHypothyroidism   Renal/GU negative Renal ROS  negative genitourinary   Musculoskeletal  (+) Arthritis ,   Abdominal   Peds  Hematology  (+) anemia , Hgb 10.9   Anesthesia Other Findings Day of surgery medications reviewed with patient.  Reproductive/Obstetrics negative OB ROS                            Anesthesia Physical Anesthesia Plan  ASA: II  Anesthesia Plan: MAC   Post-op Pain Management:    Induction:   PONV Risk Score and Plan: 2 and Treatment may vary due to age or medical condition and Propofol infusion  Airway Management Planned: Natural Airway and Simple Face Mask  Additional Equipment: None  Intra-op Plan:   Post-operative Plan:   Informed Consent: I have reviewed the patients History and Physical, chart, labs and discussed the procedure including the risks, benefits and alternatives for the proposed anesthesia with the patient or authorized representative who has indicated his/her understanding and acceptance.       Plan Discussed with: CRNA  Anesthesia Plan Comments:        Anesthesia Quick Evaluation

## 2021-01-22 NOTE — Progress Notes (Addendum)
Addendum:  Patient seen and examined by me, agree with the attached note. Patient has cervical stenosis from radiation changes from a remote history of cervical cancer. Exam findings from 2021 and recent imaging do not suggest recurrence of the malignancy. However, recommend attempted D&C under US guidance to decompress/evacuate the endometrial cavity. And take selective biopsies where appropriate. Plan is to perform this tomorrow pm.  Thereasa Solo, MD  Gynecologic Oncology  Patient alert, oriented, in no acute distress. Talked with the patient about the recommendation to move forward with the dilation and curettage of the uterus tomorrow afternoon since she will be off her plavix, which is being held for her colonoscopy in the am. Patient agreeable with the plan. Patient's son at the bedside at the end of the visit. Advised patient I would call her daughter, Maudie Mercury, with an update. The patient states she would still like to go home tomorrow. Advised since the dilation and curettage is normally an outpatient procedure, she could potentially go home if medically stable and cleared with her medical service.   She denies abdominal pain at this time. She is having liquid stools. Feels her generalized weakness is improving. She is asking about having her bladder tacked. Advised patient there has been no mention of bladder tacking in previous notes from Urology and that the procedure is not without risk etc.  Called patient's daughter, Maudie Mercury. Discussed the plan for tomorrow, which she is agreeable with. She is also asking about bladder tacking. Advised her as well upon review of recent notes, there has been no mention of this. Advised bladder and urinary concerns should be addressed with Urology.

## 2021-01-22 NOTE — Progress Notes (Signed)
IVT consult for PIV placement.  Pt not currently receiving any Rx's IV.  Per RN, Pt for procedure 3/9 and expecting IV Rx later.  RN will put in consult when she IV fluid orders.

## 2021-01-23 ENCOUNTER — Other Ambulatory Visit (HOSPITAL_COMMUNITY): Payer: Medicare HMO

## 2021-01-23 ENCOUNTER — Inpatient Hospital Stay (HOSPITAL_COMMUNITY): Payer: Medicare HMO | Admitting: Anesthesiology

## 2021-01-23 ENCOUNTER — Encounter (HOSPITAL_COMMUNITY): Payer: Self-pay | Admitting: Internal Medicine

## 2021-01-23 ENCOUNTER — Inpatient Hospital Stay (HOSPITAL_COMMUNITY): Payer: Medicare HMO

## 2021-01-23 ENCOUNTER — Encounter (HOSPITAL_COMMUNITY)
Admission: EM | Disposition: A | Discharge status: Home / Self Care | Payer: Self-pay | Source: Home / Self Care | Attending: Internal Medicine

## 2021-01-23 DIAGNOSIS — E1169 Type 2 diabetes mellitus with other specified complication: Secondary | ICD-10-CM | POA: Diagnosis not present

## 2021-01-23 DIAGNOSIS — K5900 Constipation, unspecified: Secondary | ICD-10-CM | POA: Diagnosis not present

## 2021-01-23 DIAGNOSIS — I10 Essential (primary) hypertension: Secondary | ICD-10-CM | POA: Diagnosis not present

## 2021-01-23 DIAGNOSIS — K6289 Other specified diseases of anus and rectum: Secondary | ICD-10-CM | POA: Diagnosis not present

## 2021-01-23 DIAGNOSIS — Z20822 Contact with and (suspected) exposure to covid-19: Secondary | ICD-10-CM | POA: Diagnosis not present

## 2021-01-23 DIAGNOSIS — N1330 Unspecified hydronephrosis: Secondary | ICD-10-CM | POA: Diagnosis not present

## 2021-01-23 DIAGNOSIS — D490 Neoplasm of unspecified behavior of digestive system: Secondary | ICD-10-CM

## 2021-01-23 DIAGNOSIS — R1030 Lower abdominal pain, unspecified: Secondary | ICD-10-CM | POA: Diagnosis not present

## 2021-01-23 DIAGNOSIS — E785 Hyperlipidemia, unspecified: Secondary | ICD-10-CM | POA: Diagnosis not present

## 2021-01-23 DIAGNOSIS — N136 Pyonephrosis: Secondary | ICD-10-CM | POA: Diagnosis not present

## 2021-01-23 DIAGNOSIS — E119 Type 2 diabetes mellitus without complications: Secondary | ICD-10-CM | POA: Diagnosis not present

## 2021-01-23 DIAGNOSIS — C20 Malignant neoplasm of rectum: Secondary | ICD-10-CM | POA: Diagnosis not present

## 2021-01-23 DIAGNOSIS — G9341 Metabolic encephalopathy: Secondary | ICD-10-CM | POA: Diagnosis not present

## 2021-01-23 DIAGNOSIS — Z794 Long term (current) use of insulin: Secondary | ICD-10-CM | POA: Diagnosis not present

## 2021-01-23 DIAGNOSIS — Z538 Procedure and treatment not carried out for other reasons: Secondary | ICD-10-CM

## 2021-01-23 DIAGNOSIS — R197 Diarrhea, unspecified: Secondary | ICD-10-CM | POA: Diagnosis not present

## 2021-01-23 DIAGNOSIS — C19 Malignant neoplasm of rectosigmoid junction: Secondary | ICD-10-CM | POA: Diagnosis not present

## 2021-01-23 DIAGNOSIS — N179 Acute kidney failure, unspecified: Secondary | ICD-10-CM | POA: Diagnosis not present

## 2021-01-23 DIAGNOSIS — M1990 Unspecified osteoarthritis, unspecified site: Secondary | ICD-10-CM | POA: Diagnosis not present

## 2021-01-23 DIAGNOSIS — D128 Benign neoplasm of rectum: Secondary | ICD-10-CM | POA: Diagnosis not present

## 2021-01-23 DIAGNOSIS — E1140 Type 2 diabetes mellitus with diabetic neuropathy, unspecified: Secondary | ICD-10-CM | POA: Diagnosis not present

## 2021-01-23 HISTORY — PX: COLONOSCOPY WITH PROPOFOL: SHX5780

## 2021-01-23 HISTORY — PX: BIOPSY: SHX5522

## 2021-01-23 HISTORY — PX: SUBMUCOSAL TATTOO INJECTION: SHX6856

## 2021-01-23 LAB — CULTURE, BLOOD (ROUTINE X 2)
Culture: NO GROWTH
Culture: NO GROWTH

## 2021-01-23 LAB — GLUCOSE, CAPILLARY
Glucose-Capillary: 74 mg/dL (ref 70–99)
Glucose-Capillary: 70 mg/dL (ref 70–99)
Glucose-Capillary: 155 mg/dL — ABNORMAL HIGH (ref 70–99)

## 2021-01-23 SURGERY — COLONOSCOPY WITH PROPOFOL
Anesthesia: Monitor Anesthesia Care

## 2021-01-23 SURGERY — DILATATION & CURETTAGE/HYSTEROSCOPY WITH MYOSURE
Anesthesia: Choice

## 2021-01-23 MED ORDER — LABETALOL HCL 5 MG/ML IV SOLN
INTRAVENOUS | Status: DC | PRN
  Administered 2021-01-23: 5 mg via INTRAVENOUS

## 2021-01-23 MED ORDER — SPOT INK MARKER SYRINGE KIT
PACK | SUBMUCOSAL | Status: DC | PRN
  Administered 2021-01-23: 1 mL via SUBMUCOSAL

## 2021-01-23 MED ORDER — PROPOFOL 500 MG/50ML IV EMUL
INTRAVENOUS | Status: AC
  Filled 2021-01-23: qty 50

## 2021-01-23 MED ORDER — PROPOFOL 500 MG/50ML IV EMUL
INTRAVENOUS | Status: DC | PRN
  Administered 2021-01-23: 20 ug/kg/min via INTRAVENOUS

## 2021-01-23 MED ORDER — SPOT INK MARKER SYRINGE KIT
PACK | SUBMUCOSAL | Status: AC
  Filled 2021-01-23: qty 5

## 2021-01-23 MED ORDER — GADOBUTROL 1 MMOL/ML IV SOLN
6.5000 mL | Freq: Once | INTRAVENOUS | Status: AC | PRN
  Administered 2021-01-23: 6.5 mL via INTRAVENOUS

## 2021-01-23 MED ORDER — PROPOFOL 500 MG/50ML IV EMUL
INTRAVENOUS | Status: DC | PRN
  Administered 2021-01-23: 60 mg via INTRAVENOUS
  Administered 2021-01-23: 30 mg via INTRAVENOUS

## 2021-01-23 MED ORDER — EPHEDRINE SULFATE-NACL 50-0.9 MG/10ML-% IV SOSY
PREFILLED_SYRINGE | INTRAVENOUS | Status: DC | PRN
  Administered 2021-01-23 (×4): 10 mg via INTRAVENOUS
  Administered 2021-01-23 (×2): 5 mg via INTRAVENOUS

## 2021-01-23 MED ORDER — LACTATED RINGERS IV SOLN: INTRAVENOUS | Status: DC | PRN

## 2021-01-23 MED ORDER — LIDOCAINE HCL (CARDIAC) PF 100 MG/5ML IV SOSY
PREFILLED_SYRINGE | INTRAVENOUS | Status: DC | PRN
  Administered 2021-01-23: 40 mg via INTRATRACHEAL

## 2021-01-23 MED ORDER — NITROFURANTOIN MONOHYD MACRO 100 MG PO CAPS: 100.0000 mg | ORAL_CAPSULE | Freq: Two times a day (BID) | ORAL | 0 refills | Status: AC

## 2021-01-23 SURGICAL SUPPLY — 22 items

## 2021-01-23 NOTE — Interval H&P Note (Signed)
History and Physical Interval Note:  01/23/2021 8:11 AM  [name]  has presented today for surgery, with the diagnosis of constipation, abdominal pain.  The various methods of treatment have been discussed with the patient and family. After consideration of risks, benefits and other options for treatment, the patient has consented to  Procedure(s): COLONOSCOPY WITH PROPOFOL (N/A) as a surgical intervention.  The patient's history has been reviewed, patient examined, no change in status, stable for surgery.  I have reviewed the patient's chart and labs.  Questions were answered to the patient's satisfaction.     [name]

## 2021-01-23 NOTE — Progress Notes (Signed)
PT Cancellation Note  Patient Details Name: [name] MRN: [id number] DOB: 1946-06-19   Cancelled Treatment:    Reason Eval/Treat Not Completed: Other (comment) (pt eating lunch at present, she declined PT. Will follow.)  [name] PT 01/23/2021  Acute Rehabilitation Services Pager [phone] Office [phone]

## 2021-01-23 NOTE — Transfer of Care (Signed)
Immediate Anesthesia Transfer of Care Note  Patient: [name]  Procedure(s) Performed: COLONOSCOPY WITH PROPOFOL (N/A ) BIOPSY SUBMUCOSAL TATTOO INJECTION  Patient Location: PACU  Anesthesia Type:MAC  Level of Consciousness: awake, patient cooperative and responds to stimulation  Airway & Oxygen Therapy: Patient Spontanous Breathing and Patient connected to face mask oxygen  Post-op Assessment: Report given to RN, Post -op Vital signs reviewed and stable and Patient moving all extremities X 4  Post vital signs: Reviewed and stable  Last Vitals:  Vitals Value Taken Time  BP 152/67 01/23/21 0904  Temp    Pulse 69 01/23/21 0905  Resp 16 01/23/21 0905  SpO2 100 % 01/23/21 0905  Vitals shown include unvalidated device data.  Last Pain:  Vitals:   01/23/21 0812  TempSrc: Oral  PainSc: 0-No pain      Patients Stated Pain Goal: 0 (72/89/79 1504)  Complications: No complications documented.

## 2021-01-23 NOTE — Op Note (Signed)
[facility] Patient Name: [name] Procedure Date: 01/23/2021 MRN: [id number] Attending MD: Mauri Pole , MD Date of Birth: 12/05/1945 CSN: [id number] Age: 75 Admit Type: Outpatient Procedure:                Colonoscopy Indications:              Evaluation of unexplained GI bleeding presenting                            with Hematochezia, Evaluation of unexplained GI                            bleeding presenting with fecal occult blood, Lower                            abdominal pain, Unexplained iron deficiency anemia Providers:                Mauri Pole, MD, Cleda Daub, RN,                            Faustina Mbumina, Technician Referring MD:              Medicines:                Monitored Anesthesia Care Complications:            No immediate complications. Estimated Blood Loss:     Estimated blood loss was minimal. Procedure:                Pre-Anesthesia Assessment:                           - Prior to the procedure, a History and Physical                            was performed, and patient medications and                            allergies were reviewed. The patient's tolerance of                            previous anesthesia was also reviewed. The risks                            and benefits of the procedure and the sedation                            options and risks were discussed with the patient.                            All questions were answered, and informed consent                            was obtained. Prior Anticoagulants: The patient  last took Plavix (clopidogrel) 5 days prior to the                            procedure. ASA Grade Assessment: III - A patient                            with severe systemic disease. After reviewing the                            risks and benefits, the patient was deemed in                            satisfactory condition to undergo the procedure.                            After obtaining informed consent, the colonoscope                            was passed under direct vision. Throughout the                            procedure, the patient's blood pressure, pulse, and                            oxygen saturations were monitored continuously. The                            PCF-H190DL ([id number] Olympus pediatric colonscope                            was introduced through the anus and advanced to the                            the rectum. The colonoscopy was technically                            difficult and complex due to bowel stenosis and a                            partially obstructing mass. The patient tolerated                            the procedure well. The quality of the bowel                            preparation was adequate. Scope In: 8:44:59 AM Scope Out: 8:55:48 AM Total Procedure Duration: 0 hours 10 minutes 49 seconds  Findings:      The perianal and digital rectal examinations were normal.      A malignant-appearing, intrinsic severe stenosis measuring 5 mm (inner       diameter) was found in the recto-sigmoid colon and was non-traversed.       Biopsies were taken with a cold forceps for histology.      An infiltrative and  ulcerated partially obstructing large mass was found       in the recto-sigmoid colon, 10cm from anal verge. The mass was       circumferential. Oozing was present. Biopsies were taken with a cold       forceps for histology. Distal fold area was tattooed with an injection       of 1 mL of Spot (carbon black).      Retroflexion could not be performed due to narrow rectal vault and       decreased rectal compliance, suspicious for infiltrative process       involving rectum Impression:               - Stricture in the recto-sigmoid colon. Biopsied.                           - Rule out malignancy, partially obstructing tumor                            in the recto-sigmoid colon. Biopsied.  Tattooed. Moderate Sedation:      Not Applicable - Patient had care per Anesthesia. Recommendation:           - NPO.                           - Continue present medications.                           - Await pathology results.                           - Repeat colonoscopy date to be determined after                            pending pathology results are reviewed for                            surveillance based on pathology results. Procedure Code(s):        --- Professional ---                           [phone], 34, Colonoscopy, flexible; with biopsy,                            single or multiple                           [id number], 22, Colonoscopy, flexible; with directed                            submucosal injection(s), any substance Diagnosis Code(s):        --- Professional ---                           U27.253, Other intestinal obstruction unspecified                            as to partial versus complete obstruction  D49.0, Neoplasm of unspecified behavior of                            digestive system                           K56.690, Other partial intestinal obstruction                           K92.1, Melena (includes Hematochezia)                           R19.5, Other fecal abnormalities                           R10.30, Lower abdominal pain, unspecified                           D50.9, Iron deficiency anemia, unspecified CPT copyright 2019 [employer]. All rights reserved. The codes documented in this report are preliminary and upon coder review may  be revised to meet current compliance requirements. Mauri Pole, MD 01/23/2021 9:23:31 AM This report has been signed electronically. Number of Addenda: 0

## 2021-01-23 NOTE — Progress Notes (Signed)
Gynecologic Oncology Follow Up  Patient alert, oriented, in no acute distress with daughter at the bedside. Dr. Berline Lopes discussed cancelling the dilation and curettage of the uterus for this afternoon given the new findings from the colonoscopy this am. Patient advised since the fluid in the uterus is stable compared with previous imaging studies and is unlikely to be the cause of her current symptoms, we would arrange for a follow up in the office in two weeks time to check in. Advised we would follow up on the biopsy and MRI results. Patient and daughter verbalizing understanding and all questions answered.

## 2021-01-23 NOTE — Discharge Summary (Signed)
Physician Discharge Summary  [name] VZC:[id number] DOB: 16-May-1946 DOA: 01/18/2021  PCP: Everardo Beals, NP  Admit date: 01/18/2021 Discharge date: 01/23/2021  Admitted From: Home. Disposition:  Home.   Recommendations for Outpatient Follow-up:  1. Follow up with PCP in 1-2 weeks 2. Please obtain BMP/CBC in one week 3.         Please follow up with gastroenterology as recommended.   Discharge Condition: stable. CODE STATUS: Full code Diet recommendation: Heart Healthy  Brief/Interim Summary: 75 year old lady with history of cervical cancer, diabetes hypertension, chronic abdominal pain presents to ED with worsening abdominal pain.  She was seen outpatient by [facility] underwent an EGD.  She was scheduled to get a colonoscopy but as her pain worsened she presented to ED for further evaluation.  CT of the abdomen pelvis showed large amount of stool in the colon and fluid collection in the pelvic cavity.  GI, urology, GYN oncology on board consulted.  Discharge Diagnoses:  Active Problems:   UTI (urinary tract infection)   Lower abdominal pain   Non-intractable vomiting   Hydronephrosis   Iron deficiency anemia due to chronic blood loss   Rectal mass    Bilateral hydronephrosis secondary to bilateral ureteral obstruction , as per urology likely chronic and nonobstructive.  Secondary to large fluid collection in the endometrial cavity which appears to be causing extrinsic compression of the sigmoid colon and the ureters. Urology recommends antibiotics for 1 week and outpatient follow-up.   Severe constipation/ abdominal pain:  Resolved patient is having liquid yellow stools. She underwent colonoscopy showing Stricture in the recto-sigmoid colon, partially obstructing tumor in the recto-sigmoid colon. It is biopsied and tattooed.  MRI of the pelvis ordered , done , recommend outpatient follow up with gi/GYN for the results.      CVA Restart aspirin,  plavix and statin.    Large fluid collection in the endometrial cavity causing extrinsic compression of the sigmoid colon and possibly ureters in the setting of history of cervical cancer. GYN oncology recommends D&C under ultrasound guidance to decompress and evacuate the endometrial cavity and take biopsies were appropriate, which has been postponed due to finding of rectal mass.    Diabetes mellitus:  CBG (last 3)  Recent Labs    01/23/21 0818 01/23/21 0908 01/23/21 1143  GLUCAP 74 70 155*   Diet controlled.    Neuropathy : Continue with pain meds.     Hypertension:  Well controlled.    Hyperlipidemia:  Continue with lipitor.     Discharge Instructions   Allergies as of 01/23/2021      Reactions   Codeine Nausea Only      Medication List    STOP taking these medications   bismuth subsalicylate 128 NO/67EH suspension Commonly known as: PEPTO BISMOL   cephALEXin 250 MG capsule Commonly known as: KEFLEX     TAKE these medications   aspirin 81 MG EC tablet Take 1 tablet (81 mg total) by mouth daily.   atorvastatin 40 MG tablet Commonly known as: LIPITOR Take 1 tablet (40 mg total) by mouth daily at 6 PM. What changed: when to take this   baclofen 10 MG tablet Commonly known as: LIORESAL Take 10 mg by mouth 4 (four) times daily as needed for muscle spasms.   clopidogrel 75 MG tablet Commonly known as: PLAVIX Take 1 tablet (75 mg total) by mouth daily.   ergocalciferol 1.25 MG (50000 UT) capsule Commonly known as: VITAMIN D2 Take 50,000 Units by  mouth every Sunday.   gemfibrozil 600 MG tablet Commonly known as: LOPID Take 600 mg by mouth 2 (two) times daily.   levothyroxine 100 MCG tablet Commonly known as: SYNTHROID Take 1 tablet (100 mcg total) by mouth daily before breakfast. What changed: when to take this   metFORMIN 500 MG tablet Commonly known as: GLUCOPHAGE Take 500 mg by mouth 2 (two) times daily with a meal.    methocarbamol 500 MG tablet Commonly known as: ROBAXIN Take 500 mg by mouth 2 (two) times daily as needed for muscle spasms.   nitrofurantoin (macrocrystal-monohydrate) 100 MG capsule Commonly known as: MACROBID Take 1 capsule (100 mg total) by mouth every 12 (twelve) hours for 5 days.   ondansetron 8 MG disintegrating tablet Commonly known as: ZOFRAN-ODT Take 8 mg by mouth 3 (three) times daily.   oxybutynin 10 MG 24 hr tablet Commonly known as: DITROPAN-XL Take 10 mg by mouth daily.   pantoprazole 40 MG tablet Commonly known as: PROTONIX Take 1 tablet (40 mg total) by mouth daily.   Tyler Aas FlexTouch 100 UNIT/ML FlexTouch Pen Generic drug: insulin degludec Inject 30 Units into the skin at bedtime. What changed: how much to take   triamcinolone 0.1 % Commonly known as: KENALOG Apply 1 application topically daily as needed (vaginal itching). Mix with nystatin cream   Xtampza ER 9 MG C12a Generic drug: oxyCODONE ER Take 9 mg by mouth every 12 (twelve) hours.       Follow-up Information    Lafonda Mosses, MD Follow up on 02/08/2021.   Specialty: Gynecologic Oncology Why: at 2:30pm at the [facility] attached to [facility]. We will call you if this appt needs to be changed to a phone visit. Contact information: [address]  [place] [phone]              Allergies  Allergen Reactions  . Codeine Nausea Only    Consultations:  Gastroenterology  Ob/gyn.    Procedures/Studies: US Transvaginal Non-OB  Result Date: 01/18/2021 CLINICAL DATA:  Pelvic pain, history cervical cancer, abnormal CT exam demonstrating fluid within uterus EXAM: TRANSABDOMINAL AND TRANSVAGINAL ULTRASOUND OF PELVIS TECHNIQUE: Both transabdominal and transvaginal ultrasound examinations of the pelvis were performed. Transabdominal technique was performed for global imaging of the pelvis including uterus, ovaries, adnexal regions, and pelvic  cul-de-sac. It was necessary to proceed with endovaginal exam following the transabdominal exam to visualize the uterus, endometrium, and ovaries. COMPARISON:  CT abdomen and pelvis 02/18/2021 FINDINGS: Uterus Measurements: 10.4 x 6.0 x 7.7 cm = volume: 250 mL. Anteverted. Thinned myometrium. No focal mass. Endometrium Thickness: 50 mm. Endometrial canal markedly distended by complex heterogeneous fluid question blood. No discrete mass visualized Right ovary Not visualized, likely obscured by bowel Left ovary Not visualized, likely obscured by bowel Other findings Significant debris within urinary bladder, which demonstrates slightly irregular wall. No other pelvic masses. No free pelvic fluid. IMPRESSION: Progressive distension of the endometrial canal by complex fluid likely blood with thinning of myometrium. This suggests cervical stenosis, which could be due to benign stricture or mass. Nonvisualization of ovaries. Significant debris within the urinary bladder with slightly irregular bladder wall thickening, could potentially be represent cystitis or hemorrhage though this can also be seen with other etiologies including tumor, recommend correlation with urinalysis. Electronically Signed   By: Lavonia Dana M.D.   On: 01/18/2021 16:55   US Pelvis Complete  Result Date: 01/18/2021 CLINICAL DATA:  Pelvic pain, history cervical cancer, abnormal CT  exam demonstrating fluid within uterus EXAM: TRANSABDOMINAL AND TRANSVAGINAL ULTRASOUND OF PELVIS TECHNIQUE: Both transabdominal and transvaginal ultrasound examinations of the pelvis were performed. Transabdominal technique was performed for global imaging of the pelvis including uterus, ovaries, adnexal regions, and pelvic cul-de-sac. It was necessary to proceed with endovaginal exam following the transabdominal exam to visualize the uterus, endometrium, and ovaries. COMPARISON:  CT abdomen and pelvis 02/18/2021 FINDINGS: Uterus Measurements: 10.4 x 6.0 x 7.7 cm =  volume: 250 mL. Anteverted. Thinned myometrium. No focal mass. Endometrium Thickness: 50 mm. Endometrial canal markedly distended by complex heterogeneous fluid question blood. No discrete mass visualized Right ovary Not visualized, likely obscured by bowel Left ovary Not visualized, likely obscured by bowel Other findings Significant debris within urinary bladder, which demonstrates slightly irregular wall. No other pelvic masses. No free pelvic fluid. IMPRESSION: Progressive distension of the endometrial canal by complex fluid likely blood with thinning of myometrium. This suggests cervical stenosis, which could be due to benign stricture or mass. Nonvisualization of ovaries. Significant debris within the urinary bladder with slightly irregular bladder wall thickening, could potentially be represent cystitis or hemorrhage though this can also be seen with other etiologies including tumor, recommend correlation with urinalysis. Electronically Signed   By: Lavonia Dana M.D.   On: 01/18/2021 16:55   CT ABDOMEN PELVIS W CONTRAST  Result Date: 01/18/2021 CLINICAL DATA:  Right lower quadrant abdominal pain with significant tenderness to palpation, vomiting, history cervical cancer, kidney stones, type II diabetes mellitus, hypertension, stroke, appendectomy, former smoker EXAM: CT ABDOMEN AND PELVIS WITH CONTRAST TECHNIQUE: Multidetector CT imaging of the abdomen and pelvis was performed using the standard protocol following bolus administration of intravenous contrast. Sagittal and coronal MPR images reconstructed from axial data set. CONTRAST:  140m OMNIPAQUE IOHEXOL 300 MG/ML SOLN IV. No oral contrast. COMPARISON:  12/12/2019 FINDINGS: Lower chest: Dependent bibasilar atelectasis, decreased. Hepatobiliary: Gallbladder and liver normal appearance Pancreas: Atrophic pancreas without mass Spleen: Normal appearance.  Tiny splenule posterior to spleen. Adrenals/Urinary Tract: Adrenal glands normal appearance. BILATERAL  renal cortical thinning without mass. BILATERAL hydronephrosis and hydroureter with wall thickening and enhancement of the ureters and renal pelves, raising question of urinary tract infection; recommend correlation with urinalysis. No urinary tract calcifications. Bladder grossly unremarkable, portions suboptimally visualized due to beam hardening artifacts in pelvis. Stomach/Bowel: Appendix surgically absent by history. Stomach decompressed. Mildly prominent stool throughout colon. Large and small bowel loops otherwise unremarkable. Vascular/Lymphatic: Atherosclerotic calcifications aorta, iliac arteries, visceral arteries. Aorta normal caliber. No adenopathy. Reproductive: Large fluid attenuation structure with a well-defined rim centrally in the pelvis adjacent to the cranial aspect of the urinary bladder, appears to represent endometrial cavity of uterus distended by fluid consistent with hydro me truss. Unremarkable adnexa. Other: No free air or free fluid. No hernia or inflammatory process. Musculoskeletal: Osseous demineralization. LEFT hip prosthesis. Scattered degenerative disc and facet disease changes of the spine. IMPRESSION: BILATERAL hydronephrosis and hydroureter with wall thickening and enhancement of the ureters and renal pelves, raising question of urinary tract infection, with no obstructing ureteral calcifications seen; recommend correlation with urinalysis. Large fluid attenuation structure with a well-defined rim in the pelvis adjacent to the cranial aspect of the urinary bladder, appears to represent endometrial cavity of uterus distended by fluid consistent with hydrometros; recommend assessment by pelvic and transvaginal ultrasound. Post appendectomy. Prominent stool in throughout colon. Aortic Atherosclerosis (ICD10-I70.0). Electronically Signed   By: MLavonia DanaM.D.   On: 01/18/2021 12:58   UKoreaRENAL  Result Date: 01/20/2021  CLINICAL DATA:  Hydronephrosis EXAM: RENAL / URINARY TRACT  ULTRASOUND COMPLETE COMPARISON:  January 18, 2021 FINDINGS: Right Kidney: Renal measurements: 8.1 x 3.7 x 4.3 cm = volume: 68 mL. Mild diffuse cortical thinning. Echogenicity is within normal limits. There is mild hydronephrosis. Left Kidney: Renal measurements: 9.7 x 5.4 x 5.8 cm = volume: 158 mL. Echogenicity is within normal limits. There is mild hydronephrosis. Bladder: Completely decompressed around a Foley catheter. Other: None. IMPRESSION: There is mild bilateral hydronephrosis. Electronically Signed   By: Valentino Saxon MD   On: 01/20/2021 13:21   DG Chest Portable 1 View  Result Date: 01/18/2021 CLINICAL DATA:  Fever EXAM: PORTABLE CHEST 1 VIEW COMPARISON:  02/10/2020 FINDINGS: Heart and mediastinal contours are within normal limits. No focal opacities or effusions. No acute bony abnormality. IMPRESSION: Negative. Electronically Signed   By: Rolm Baptise M.D.   On: 01/18/2021 11:07   VAS US CAROTID  Result Date: 01/14/2021 Carotid Arterial Duplex Study Indications:       Right endarterectomy (03/28/2020). Risk Factors:      Hyperlipidemia, Diabetes. Comparison Study:  12/13/2019: Rt ICA 40-59%, Lt ICA 40-59% Performing Technologist: Ivan Croft  Examination Guidelines: A complete evaluation includes B-mode imaging, spectral Doppler, color Doppler, and power Doppler as needed of all accessible portions of each vessel. Bilateral testing is considered an integral part of a complete examination. Limited examinations for reoccurring indications may be performed as noted.  Right Carotid Findings: +----------+--------+--------+--------+------------------+------------+           PSV cm/sEDV cm/sStenosisPlaque DescriptionComments     +----------+--------+--------+--------+------------------+------------+ CCA Prox  75      10                                             +----------+--------+--------+--------+------------------+------------+ CCA Mid   56      10                                              +----------+--------+--------+--------+------------------+------------+ CCA Distal200     35              heterogenous      Elevated PSV +----------+--------+--------+--------+------------------+------------+ ICA Prox  123     18      1-39%   heterogenous                   +----------+--------+--------+--------+------------------+------------+ ICA Mid   146     29                                             +----------+--------+--------+--------+------------------+------------+ ICA Distal135     25                                             +----------+--------+--------+--------+------------------+------------+ ECA       120     9               heterogenous                   +----------+--------+--------+--------+------------------+------------+ +----------+--------+-------+----------------+-------------------+  PSV cm/sEDV cmsDescribe        Arm Pressure (mmHG) +----------+--------+-------+----------------+-------------------+ [id number]            Multiphasic, WNL                    +----------+--------+-------+----------------+-------------------+ +---------+--------+---+--------+--+---------+ VertebralPSV cm/s116EDV cm/s17Antegrade +---------+--------+---+--------+--+---------+  Left Carotid Findings: +----------+--------+--------+--------+-------------------------+--------+           PSV cm/sEDV cm/sStenosisPlaque Description       Comments +----------+--------+--------+--------+-------------------------+--------+ CCA Prox  134     14                                                +----------+--------+--------+--------+-------------------------+--------+ CCA Mid   156     25                                                +----------+--------+--------+--------+-------------------------+--------+ CCA Distal175     29              homogeneous                        +----------+--------+--------+--------+-------------------------+--------+ ICA Prox  256     44      40-59%  homogeneous                       +----------+--------+--------+--------+-------------------------+--------+ ICA Mid   137     19                                                +----------+--------+--------+--------+-------------------------+--------+ ICA Distal136     22                                                +----------+--------+--------+--------+-------------------------+--------+ ECA       167     12              heterogenous and calcific         +----------+--------+--------+--------+-------------------------+--------+ +----------+--------+--------+----------------+-------------------+           PSV cm/sEDV cm/sDescribe        Arm Pressure (mmHG) +----------+--------+--------+----------------+-------------------+ Subclavian203             Multiphasic, WNL                    +----------+--------+--------+----------------+-------------------+ +---------+--------+--+--------+-+---------+ VertebralPSV cm/s91EDV cm/s8Antegrade +---------+--------+--+--------+-+---------+   Summary: Right Carotid: Patent right carotid endarterectomy. Velocities in the right ICA                are consistent with a 1-39% stenosis. Elevated peak systolic                velocity at area of narrowing at bifurcation. Left Carotid: Velocities in the left ICA are consistent with a 40-59% stenosis. Vertebrals: Bilateral vertebral arteries demonstrate antegrade flow. *See table(s) above for measurements and observations.  Electronically signed by Harold Barban MD on 01/14/2021 at 11:54:21 AM.    Final  Subjective:  No new complaints.  Discharge Exam: Vitals:   01/23/21 0930 01/23/21 1137  BP: (!) 170/51 (!) 153/53  Pulse: 64 66  Resp: 19 18  Temp:    SpO2: 99% 99%   Vitals:   01/23/21 0920 01/23/21 0925 01/23/21 0930 01/23/21 1137  BP: (!) 151/43  (!)  170/51 (!) 153/53  Pulse: 63 (!) 58 64 66  Resp: 17 17 19 18   Temp:      TempSrc:      SpO2: 100% 99% 99% 99%  Weight:      Height:        General: Pt is alert, awake, not in acute distress Cardiovascular: RRR, S1/S2 +, no rubs, no gallops Respiratory: CTA bilaterally, no wheezing, no rhonchi Abdominal: Soft, NT, ND, bowel sounds + Extremities: no edema, no cyanosis    The results of significant diagnostics from this hospitalization (including imaging, microbiology, ancillary and laboratory) are listed below for reference.     Microbiology: Recent Results (from the past 240 hour(s))  Urine culture     Status: Abnormal   Collection Time: 01/18/21  2:00 PM   Specimen: Urine, Random  Result Value Ref Range Status   Specimen Description   Final    URINE, RANDOM Performed at [facility] [address], [place], [place] [address]    Special Requests   Final    NONE Performed at [facility], [place] [address], [place], [place] [address]    Culture >=100,000 COLONIES/mL CITROBACTER FREUNDII (A)  Final   Report Status 01/21/2021 FINAL  Final   Organism ID, Bacteria CITROBACTER FREUNDII (A)  Final      Susceptibility   Citrobacter freundii - MIC*    CEFAZOLIN >=64 RESISTANT Resistant     CEFEPIME 1 SENSITIVE Sensitive     CEFTRIAXONE >=64 RESISTANT Resistant     CIPROFLOXACIN <=0.25 SENSITIVE Sensitive     GENTAMICIN <=1 SENSITIVE Sensitive     IMIPENEM 0.5 SENSITIVE Sensitive     NITROFURANTOIN <=16 SENSITIVE Sensitive     TRIMETH/SULFA <=20 SENSITIVE Sensitive     PIP/TAZO 64 INTERMEDIATE Intermediate     * >=100,000 COLONIES/mL CITROBACTER FREUNDII  SARS CORONAVIRUS 2 (TAT 6-24 HRS) Nasopharyngeal Nasopharyngeal Swab     Status: None   Collection Time: 01/18/21  3:55 PM   Specimen: Nasopharyngeal Swab  Result Value Ref Range Status   SARS Coronavirus 2 NEGATIVE NEGATIVE Final    Comment: (NOTE) SARS-CoV-2 target nucleic acids are  NOT DETECTED.  The SARS-CoV-2 RNA is generally detectable in upper and lower respiratory specimens during the acute phase of infection. Negative results do not preclude SARS-CoV-2 infection, do not rule out co-infections with other pathogens, and should not be used as the sole basis for treatment or other patient management decisions. Negative results must be combined with clinical observations, patient history, and epidemiological information. The expected result is Negative.  Fact Sheet for Patients: SugarRoll.be  Fact Sheet for Healthcare Providers: https://www.woods-mathews.com/  This test is not yet approved or cleared by the [place] FDA and  has been authorized for detection and/or diagnosis of SARS-CoV-2 by FDA under an Emergency Use Authorization (EUA). This EUA will remain  in effect (meaning this test can be used) for the duration of the COVID-19 declaration under Se ction 564(b)(1) of the Act, 21 U.S.C. section 360bbb-3(b)(1), unless the authorization is terminated or revoked sooner.  Performed at [facility], [place] [address], [place], [place] [address]  Blood culture (routine x 2)     Status: None   Collection Time: 01/18/21  6:30 PM   Specimen: BLOOD LEFT HAND  Result Value Ref Range Status   Specimen Description   Final    BLOOD LEFT HAND Performed at [facility] [address] [place], [place] [address]    Special Requests   Final    BOTTLES DRAWN AEROBIC AND ANAEROBIC Blood Culture results may not be optimal due to an inadequate volume of blood received in culture bottles Performed at [facility] [address] [place], [place] [address]    Culture   Final    NO GROWTH 5 DAYS Performed at [facility], [place] [address], [place], [place] [address]    Report Status 01/23/2021 FINAL  Final  C Difficile Quick Screen w PCR reflex     Status: Abnormal    Collection Time: 01/18/21  7:00 PM   Specimen: STOOL  Result Value Ref Range Status   C Diff antigen POSITIVE (A) NEGATIVE Final   C Diff toxin NEGATIVE NEGATIVE Final   C Diff interpretation Results are indeterminate. See PCR results.  Final    Comment: Performed at [facility], [place] [address] [place], [place] [address]  Gastrointestinal Panel by PCR , Stool     Status: None   Collection Time: 01/18/21  7:00 PM   Specimen: STOOL  Result Value Ref Range Status   Campylobacter species NOT DETECTED NOT DETECTED Final   Plesimonas shigelloides NOT DETECTED NOT DETECTED Final   Salmonella species NOT DETECTED NOT DETECTED Final   Yersinia enterocolitica NOT DETECTED NOT DETECTED Final   Vibrio species NOT DETECTED NOT DETECTED Final   Vibrio cholerae NOT DETECTED NOT DETECTED Final   Enteroaggregative E coli (EAEC) NOT DETECTED NOT DETECTED Final   Enteropathogenic E coli (EPEC) NOT DETECTED NOT DETECTED Final   Enterotoxigenic E coli (ETEC) NOT DETECTED NOT DETECTED Final   Shiga like toxin producing E coli (STEC) NOT DETECTED NOT DETECTED Final   Shigella/Enteroinvasive E coli (EIEC) NOT DETECTED NOT DETECTED Final   Cryptosporidium NOT DETECTED NOT DETECTED Final   Cyclospora cayetanensis NOT DETECTED NOT DETECTED Final   Entamoeba histolytica NOT DETECTED NOT DETECTED Final   Giardia lamblia NOT DETECTED NOT DETECTED Final   Adenovirus F40/41 NOT DETECTED NOT DETECTED Final   Astrovirus NOT DETECTED NOT DETECTED Final   Norovirus GI/GII NOT DETECTED NOT DETECTED Final   Rotavirus A NOT DETECTED NOT DETECTED Final   Sapovirus (I, II, IV, and V) NOT DETECTED NOT DETECTED Final    Comment: Performed at [facility], [place] [place], [place] [address]  Blood culture (routine x 2)     Status: None   Collection Time: 01/18/21  8:10 PM   Specimen: BLOOD LEFT HAND  Result Value Ref Range Status   Specimen Description   Final    BLOOD LEFT  HAND Performed at [facility], [place] [address], [place], [place] [address]    Special Requests   Final    BOTTLES DRAWN AEROBIC AND ANAEROBIC Blood Culture results may not be optimal due to an inadequate volume of blood received in culture bottles Performed at [facility] [address], [place], [place] [address]    Culture   Final    NO GROWTH 5 DAYS Performed at [facility], [place] [address], [place], [place] [address]    Report Status  01/23/2021 FINAL  Final     Labs: BNP (last 3 results) No results for input(s): BNP in the last 8760 hours. Basic Metabolic Panel: Recent Labs  Lab 01/18/21 1034 01/19/21 0347 01/20/21 0340 01/21/21 0409 01/22/21 0410  NA 137 144 144 141 141  K 3.5 3.0* 3.3* 2.8* 3.8  CL 101 110 112* 110 113*  CO2 23 27 24  21* 20*  GLUCOSE 277* 106* 118* 120* 92  BUN 12 12 13 9  6*  CREATININE 1.11* 1.08* 0.97 0.86 0.80  CALCIUM 8.3* 7.7* 7.4* 8.1* 8.5*  MG  --  1.2*  --  2.0 1.5*   Liver Function Tests: Recent Labs  Lab 01/18/21 1034 01/19/21 0347 01/20/21 0340 01/21/21 0409 01/22/21 0410  AST 23 15 14* 17 19  ALT 15 11 10 12 14   ALKPHOS 125 83 88 100 105  BILITOT 0.4 0.5 0.4 0.4 0.5  PROT 6.9 5.6* 5.2* 6.1* 6.5  ALBUMIN 2.8* 2.1* 2.0* 2.4* 2.8*   Recent Labs  Lab 01/18/21 1034  LIPASE 29   No results for input(s): AMMONIA in the last 168 hours. CBC: Recent Labs  Lab 01/18/21 1153 01/19/21 0347 01/20/21 0340 01/22/21 0410  WBC 9.2 8.2 5.7 4.7  NEUTROABS 7.9*  --   --   --   HGB 11.0* 10.1* 9.2* 10.9*  HCT 34.6* 31.8* 29.1* 35.5*  MCV 86.9 87.8 87.9 89.0  PLT 238 228 218 264   Cardiac Enzymes: No results for input(s): CKTOTAL, CKMB, CKMBINDEX, TROPONINI in the last 168 hours. BNP: Invalid input(s): POCBNP CBG: Recent Labs  Lab 01/22/21 1116 01/22/21 1616 01/23/21 0818 01/23/21 0908 01/23/21 1143  GLUCAP 170* 97 74 70 155*   D-Dimer No results for input(s): DDIMER in the  last 72 hours. Hgb A1c No results for input(s): HGBA1C in the last 72 hours. Lipid Profile No results for input(s): CHOL, HDL, LDLCALC, TRIG, CHOLHDL, LDLDIRECT in the last 72 hours. Thyroid function studies No results for input(s): TSH, T4TOTAL, T3FREE, THYROIDAB in the last 72 hours.  Invalid input(s): FREET3 Anemia work up No results for input(s): VITAMINB12, FOLATE, FERRITIN, TIBC, IRON, RETICCTPCT in the last 72 hours. Urinalysis    Component Value Date/Time   COLORURINE YELLOW 01/18/2021 1400   APPEARANCEUR TURBID (A) 01/18/2021 1400   LABSPEC 1.015 01/18/2021 1400   PHURINE 5.0 01/18/2021 1400   GLUCOSEU NEGATIVE 01/18/2021 1400   HGBUR MODERATE (A) 01/18/2021 1400   BILIRUBINUR NEGATIVE 01/18/2021 1400   KETONESUR NEGATIVE 01/18/2021 1400   PROTEINUR 30 (A) 01/18/2021 1400   UROBILINOGEN 0.2 05/27/2014 1203   NITRITE NEGATIVE 01/18/2021 1400   LEUKOCYTESUR LARGE (A) 01/18/2021 1400   Sepsis Labs Invalid input(s): PROCALCITONIN,  WBC,  LACTICIDVEN Microbiology Recent Results (from the past 240 hour(s))  Urine culture     Status: Abnormal   Collection Time: 01/18/21  2:00 PM   Specimen: Urine, Random  Result Value Ref Range Status   Specimen Description   Final    URINE, RANDOM Performed at [facility], [place] [address] [place], [place] [address]    Special Requests   Final    NONE Performed at [facility], [place] [address] [place], [place] [address]    Culture >=100,000 COLONIES/mL CITROBACTER FREUNDII (A)  Final   Report Status 01/21/2021 FINAL  Final   Organism ID, Bacteria CITROBACTER FREUNDII (A)  Final      Susceptibility   Citrobacter freundii - MIC*    CEFAZOLIN >=64 RESISTANT Resistant  CEFEPIME 1 SENSITIVE Sensitive     CEFTRIAXONE >=64 RESISTANT Resistant     CIPROFLOXACIN <=0.25 SENSITIVE Sensitive     GENTAMICIN <=1 SENSITIVE Sensitive     IMIPENEM 0.5 SENSITIVE Sensitive     NITROFURANTOIN <=16  SENSITIVE Sensitive     TRIMETH/SULFA <=20 SENSITIVE Sensitive     PIP/TAZO 64 INTERMEDIATE Intermediate     * >=100,000 COLONIES/mL CITROBACTER FREUNDII  SARS CORONAVIRUS 2 (TAT 6-24 HRS) Nasopharyngeal Nasopharyngeal Swab     Status: None   Collection Time: 01/18/21  3:55 PM   Specimen: Nasopharyngeal Swab  Result Value Ref Range Status   SARS Coronavirus 2 NEGATIVE NEGATIVE Final    Comment: (NOTE) SARS-CoV-2 target nucleic acids are NOT DETECTED.  The SARS-CoV-2 RNA is generally detectable in upper and lower respiratory specimens during the acute phase of infection. Negative results do not preclude SARS-CoV-2 infection, do not rule out co-infections with other pathogens, and should not be used as the sole basis for treatment or other patient management decisions. Negative results must be combined with clinical observations, patient history, and epidemiological information. The expected result is Negative.  Fact Sheet for Patients: SugarRoll.be  Fact Sheet for Healthcare Providers: https://www.woods-mathews.com/  This test is not yet approved or cleared by the [place] FDA and  has been authorized for detection and/or diagnosis of SARS-CoV-2 by FDA under an Emergency Use Authorization (EUA). This EUA will remain  in effect (meaning this test can be used) for the duration of the COVID-19 declaration under Se ction 564(b)(1) of the Act, 21 U.S.C. section 360bbb-3(b)(1), unless the authorization is terminated or revoked sooner.  Performed at [facility], [place] [address] [place], [place] [address]   Blood culture (routine x 2)     Status: None   Collection Time: 01/18/21  6:30 PM   Specimen: BLOOD LEFT HAND  Result Value Ref Range Status   Specimen Description   Final    BLOOD LEFT HAND Performed at [facility] [address], [place], [place] [address]    Special Requests   Final    BOTTLES DRAWN  AEROBIC AND ANAEROBIC Blood Culture results may not be optimal due to an inadequate volume of blood received in culture bottles Performed at [place] [address], [place], [place] [address]    Culture   Final    NO GROWTH 5 DAYS Performed at [facility], [place] [address], [place], [place] [address]    Report Status 01/23/2021 FINAL  Final  C Difficile Quick Screen w PCR reflex     Status: Abnormal   Collection Time: 01/18/21  7:00 PM   Specimen: STOOL  Result Value Ref Range Status   C Diff antigen POSITIVE (A) NEGATIVE Final   C Diff toxin NEGATIVE NEGATIVE Final   C Diff interpretation Results are indeterminate. See PCR results.  Final    Comment: Performed at [facility], [place] [address], [place], [place] [address]  Gastrointestinal Panel by PCR , Stool     Status: None   Collection Time: 01/18/21  7:00 PM   Specimen: STOOL  Result Value Ref Range Status   Campylobacter species NOT DETECTED NOT DETECTED Final   Plesimonas shigelloides NOT DETECTED NOT DETECTED Final   Salmonella species NOT DETECTED NOT DETECTED Final   Yersinia enterocolitica NOT DETECTED NOT DETECTED Final   Vibrio species NOT DETECTED NOT DETECTED Final   Vibrio cholerae NOT DETECTED NOT DETECTED Final  Enteroaggregative E coli (EAEC) NOT DETECTED NOT DETECTED Final   Enteropathogenic E coli (EPEC) NOT DETECTED NOT DETECTED Final   Enterotoxigenic E coli (ETEC) NOT DETECTED NOT DETECTED Final   Shiga like toxin producing E coli (STEC) NOT DETECTED NOT DETECTED Final   Shigella/Enteroinvasive E coli (EIEC) NOT DETECTED NOT DETECTED Final   Cryptosporidium NOT DETECTED NOT DETECTED Final   Cyclospora cayetanensis NOT DETECTED NOT DETECTED Final   Entamoeba histolytica NOT DETECTED NOT DETECTED Final   Giardia lamblia NOT DETECTED NOT DETECTED Final   Adenovirus F40/41 NOT DETECTED NOT DETECTED Final   Astrovirus NOT DETECTED NOT DETECTED Final   Norovirus  GI/GII NOT DETECTED NOT DETECTED Final   Rotavirus A NOT DETECTED NOT DETECTED Final   Sapovirus (I, II, IV, and V) NOT DETECTED NOT DETECTED Final    Comment: Performed at [facility], [place] [place], [place] [address]  Blood culture (routine x 2)     Status: None   Collection Time: 01/18/21  8:10 PM   Specimen: BLOOD LEFT HAND  Result Value Ref Range Status   Specimen Description   Final    BLOOD LEFT HAND Performed at [facility], [place] [address] [place], [place] [address]    Special Requests   Final    BOTTLES DRAWN AEROBIC AND ANAEROBIC Blood Culture results may not be optimal due to an inadequate volume of blood received in culture bottles Performed at [place] [address], [place], [place] [address]    Culture   Final    NO GROWTH 5 DAYS Performed at [facility], [place] [address], [place], [place] [address]    Report Status 01/23/2021 FINAL  Final     Time coordinating discharge:36 minutes  SIGNED:   Hosie Poisson, MD  [facility] 01/23/2021, 6:43 PM

## 2021-01-23 NOTE — Progress Notes (Signed)
Pt can have clears per dr. Silverio Decamp. Ginger ale given to patient to sip.

## 2021-01-23 NOTE — Anesthesia Procedure Notes (Signed)
Procedure Name: MAC Date/Time: 01/23/2021 8:35 AM Performed by: Michele Rockers, CRNA Pre-anesthesia Checklist: Patient identified, Emergency Drugs available, Suction available, Timeout performed and Patient being monitored Patient Re-evaluated:Patient Re-evaluated prior to induction Oxygen Delivery Method: Simple face mask

## 2021-01-23 NOTE — Anesthesia Postprocedure Evaluation (Signed)
Anesthesia Post Note  Patient: [name]  Procedure(s) Performed: COLONOSCOPY WITH PROPOFOL (N/A ) BIOPSY SUBMUCOSAL TATTOO INJECTION     Patient location during evaluation: PACU Anesthesia Type: MAC Level of consciousness: awake and alert and oriented Pain management: pain level controlled Vital Signs Assessment: post-procedure vital signs reviewed and stable Respiratory status: spontaneous breathing, nonlabored ventilation and respiratory function stable Cardiovascular status: blood pressure returned to baseline Postop Assessment: no apparent nausea or vomiting Anesthetic complications: no   No complications documented.  Last Vitals:  Vitals:   01/23/21 0925 01/23/21 0930  BP:  (!) 170/51  Pulse: (!) 58 64  Resp: 17 19  Temp:    SpO2: 99% 99%    Last Pain:  Vitals:   01/23/21 0910  TempSrc:   PainSc: 0-No pain                 Brennan Bailey

## 2021-01-23 NOTE — Progress Notes (Signed)
Tolerated movie prep well, running clear

## 2021-01-23 NOTE — Progress Notes (Signed)
Pt blood sugar at 70. Patient states she feels fine. Dr. Silverio Decamp made aware. Nothing by mouth currently. She stated she would order D5 fluid for the floor. Will continue to monitor.

## 2021-01-24 ENCOUNTER — Telehealth: Payer: Self-pay | Admitting: Gastroenterology

## 2021-01-24 DIAGNOSIS — C189 Malignant neoplasm of colon, unspecified: Secondary | ICD-10-CM

## 2021-01-24 NOTE — Telephone Encounter (Signed)
Dr. Melina Copa of Pathology called after-hours with biopsy results from procedure yesterday, large mass noted in the rectosigmoid colon is adenocarcinoma, which was expected.   Margarette Asal / Bertram Millard - wanted to relay this. I did not call her after hours tonight, I have never met her, if one of you can contact her tomorrow. Thanks

## 2021-01-25 ENCOUNTER — Encounter (HOSPITAL_COMMUNITY): Payer: Self-pay | Admitting: Gastroenterology

## 2021-01-25 ENCOUNTER — Encounter: Payer: Medicare HMO | Admitting: Gastroenterology

## 2021-01-25 ENCOUNTER — Telehealth: Payer: Self-pay | Admitting: Oncology

## 2021-01-25 NOTE — Telephone Encounter (Signed)
Received a new patient referral from Dr. Tarri Glenn for colon cancer. Ms. Lochridge has been scheduled to see Dr. Benay Spice on 3/15 at 2pm. Appt date and time has been given to the pt's daughter.

## 2021-01-25 NOTE — Telephone Encounter (Signed)
Called patient and discussed rectal biopsy and MRI pelvis results.  Please send urgent referral to GI cancer coordinator, she will need Oncology visit and also surgery for possible colostomy as she is at risk for rectal obstruction. Thank you

## 2021-01-25 NOTE — Telephone Encounter (Signed)
Patients daughter called requesting a call from a nurse to go over previous message results. Said the patient did not understand what they told her and they need to know what's going on.

## 2021-01-25 NOTE — Telephone Encounter (Signed)
Order was placed for oncology and referral faxed to [place].  Patient has been scheduled with Dr. Benay Spice on 01/29/21 with oncology. Joelene Millin is aware that they will be contacted directly by [facility] with an appointment date and time.

## 2021-01-25 NOTE — Telephone Encounter (Signed)
I have made the daughter the primary contact

## 2021-01-25 NOTE — Telephone Encounter (Signed)
Patient understood the results but her daughter and grand daughter were not there with her and they both wanted to hear the results directly from me. Called back and discussed results with both Daughter Joelene Millin and grand daughter Lannette Donath.  Patty, can please check if you can list Joelene Millin as the Primary contact for any results or appointments? Thanks

## 2021-01-25 NOTE — Telephone Encounter (Signed)
Dr Silverio Decamp the pt's daughter would like you to call her to discuss the pt's results.  [name] [phone]

## 2021-01-28 ENCOUNTER — Other Ambulatory Visit: Payer: Self-pay

## 2021-01-28 DIAGNOSIS — C20 Malignant neoplasm of rectum: Secondary | ICD-10-CM

## 2021-01-28 NOTE — Telephone Encounter (Signed)
Thank you for your help with her care.

## 2021-01-29 ENCOUNTER — Other Ambulatory Visit: Payer: Self-pay

## 2021-01-29 ENCOUNTER — Telehealth: Payer: Self-pay | Admitting: Oncology

## 2021-01-29 ENCOUNTER — Inpatient Hospital Stay: Payer: Medicare HMO | Attending: Oncology | Admitting: Oncology

## 2021-01-29 VITALS — BP 162/60 | HR 78 | Temp 99.5°F | Resp 18 | Ht 60.0 in | Wt 149.1 lb

## 2021-01-29 DIAGNOSIS — E1121 Type 2 diabetes mellitus with diabetic nephropathy: Secondary | ICD-10-CM

## 2021-01-29 DIAGNOSIS — G8929 Other chronic pain: Secondary | ICD-10-CM | POA: Diagnosis not present

## 2021-01-29 DIAGNOSIS — R197 Diarrhea, unspecified: Secondary | ICD-10-CM

## 2021-01-29 DIAGNOSIS — N1339 Other hydronephrosis: Secondary | ICD-10-CM

## 2021-01-29 DIAGNOSIS — Z87891 Personal history of nicotine dependence: Secondary | ICD-10-CM | POA: Diagnosis not present

## 2021-01-29 DIAGNOSIS — C20 Malignant neoplasm of rectum: Secondary | ICD-10-CM

## 2021-01-29 NOTE — Progress Notes (Signed)
[facility] New Patient Consult   Requesting MD: Everardo Beals, [place],  [place] [address]   [name] 75 y.o.  Jun 22, 1946    Reason for Consult: Rectal cancer   HPI: [name] has a history of colon polyps.  She underwent a colonoscopy on 01/02/2021.  The colonoscopy was not completed secondary to a fecal impaction.  She reported chronic diarrhea felt to be due to overflow diarrhea. She was placed on MiraLAX and a repeat colonoscopy was scheduled for 01/25/2021.  She developed lower abdominal pain with a fever and presented to the emergency room on 01/18/2021.  A CT of the abdomen/pelvis on 01/18/2021 revealed bilateral renal cortical thinning, bilateral hydronephrosis and hydroureter with wall thickening and enhancement of the ureters and renal pelvis.  Mild prominence of stool throughout the colon.  No adenopathy.  A large fluid attenuation structure with a well-defined rim was noted in the central pelvis adjacent to the cranial aspect of the bladder felt to represent the endometrial cavity of the uterus.  No free fluid.  A pelvic ultrasound 01/18/2021 found the endometrial cavity to be distended by a heterogenous fluid collection with no discrete mass.  The ovaries were not visualized.   A renal ultrasound on 01/20/2021 revealed mild left and right hydronephrosis and normal renal echogenicity.  She was seen in consultation by Dr. Alinda Money.  The hydronephrosis was felt to be secondary to urinary retention.  [name] has a remote history of cervical cancer.  She was seen in consultation by Dr. Berline Lopes who felt the clinical presentation was unrelated to fluid in the uterus.  An exam under anesthesia with a dilation and curettage was recommended as an outpatient.  Gastroenterology was consulted.  A colonoscopy on 01/23/2021 revealed a malignant appearing stenosis at the rectosigmoid that was not traversed.  Biopsies were obtained.  A mass was confirmed  at 10 cm from the anal verge with oozing present.  Biopsies were obtained and the area was tattooed.  The pathology from the rectal biopsy returned as adenocarcinoma arising in a background of high-grade dysplasia.  [name] has been referred to Drs. Moody and Paisley.  She underwent a pelvic MRI on 01/23/2021.  This confirmed a tumor at 7.6 cm from the anal verge.  Extension through the muscularis propria could not be defined due to surrounding posttreatment change.  Extensive mesorectal thickening along the margin of the mesorectum as it interfaces the rectum and cervix was noted.  Hematometria as seen previously.  Abnormal signal at the anterior mesorectum measuring 2.4 x 1.1 cm with tethering of adjacent structures, similar to a previous MRI.  The presence of worsening hydronephrosis was felt to be suspicious for recurrence of cervical cancer or advanced rectal cancer.  A precise MRI stage could not be confirmed.  Past Medical History:  Diagnosis Date  . Arthritis   . Basal ganglia stroke [facility])  January 2021  . Bilateral lower extremity edema   . Cancer ([place])   . Carotid artery occlusion   . Flank pain   . Full dentures   . History of cervical cancer    11/ 1994  Stage IB  s/p  high dose radiation brachytherapy @ [facility] 01/ 1995--- per pt no recurrence  . History of chronic bronchitis   . History of hyperthyroidism    d 03/ 2011---due to grave's disease--- s/p  RAI treatment 04/ 2011  . History of kidney stones   . History of sepsis  05/03/2018   due to UTI with klebsiella/ pyelonephritis/ ureteral obstruction cause by stone  . Hyperlipidemia   . Hypothyroidism, postradioiodine therapy    endocrinologist--  dr Loanne Drilling--  dx graves disease and s/p RAI i131 treatement 4/ 2011  . Nauseated   . Oral thrush 05/03/2018  . Pneumonia   . Type 2 diabetes mellitus treated with insulin ([place])    FOLLOWED BY PCP  . Urgency of urination   . Wears glasses     .  Rectal cancer-March 2022   .  G3,  P3   .  Neuropathy Past Surgical History:  Procedure Laterality Date  . APPENDECTOMY    . BIOPSY  01/23/2021   Procedure: BIOPSY;  Surgeon: Mauri Pole, MD;  Location: [facility];  Service: Endoscopy;;  . CAROTID ENDARTERECTOMY Right 03/28/2020  . CATARACT EXTRACTION W/ INTRAOCULAR LENS  IMPLANT, BILATERAL  2004  approx.  . COLONOSCOPY WITH PROPOFOL N/A 01/23/2021   Procedure: COLONOSCOPY WITH PROPOFOL;  Surgeon: Mauri Pole, MD;  Location: [facility];  Service: Endoscopy;  Laterality: N/A;  . CYSTOSCOPY WITH STENT PLACEMENT Right 05/04/2018   Procedure: CYSTOSCOPY WITH STENT PLACEMENT AND RETROGRADE PYELOGRAM;  Surgeon: Ceasar Mons, MD;  Location: [facility];  Service: Urology;  Laterality: Right;  . CYSTOSCOPY/URETEROSCOPY/HOLMIUM LASER/STENT PLACEMENT Right 05/19/2018   Procedure: CYSTOSCOPY, URETEROSCOPY/HOLMIUM LASER, STONE BASKETRY/ STENT EXCHANGE;  Surgeon: Ceasar Mons, MD;  [facility];  Service: Urology;  Laterality: Right;  . ENDARTERECTOMY Right 03/28/2020   Procedure: RIGHT CAROTID ENDARTERECTOMY with PATCH ANGIOPLASTY;  Surgeon: Rosetta Posner, MD;  Location: [facility];  Service: Vascular;  Laterality: Right;  . EXCISION MASS LEFT CHEST WALL  09-20-2007   dr Grandville Silos  [facility]  . Radioactive Iodine Therapy     for thyroid  . REVISION TOTAL HIP ARTHROPLASTY Left early 2000s  . SUBMUCOSAL TATTOO INJECTION  01/23/2021   Procedure: SUBMUCOSAL TATTOO INJECTION;  Surgeon: Mauri Pole, MD;  Location: [facility];  Service: Endoscopy;;  . TANDEM RING INSERTION  1995   dr Aldean Ast @ [facility]   EUA w/ tandem placement in ovid  (for direct high dose radiation brachytherapy , cervical cancer)  . TOTAL HIP ARTHROPLASTY Left 1990s  . TRANSTHORACIC ECHOCARDIOGRAM  08/07/2017   mild focal basal hypertrophy of the septum,  ef 26-94%, grade 1 diastolic dysfunction/  atrial septum with lipomatous hypertrophy/  trivial PR    .  Left  knee arthroscopy  Medications: Reviewed  Allergies:  Allergies  Allergen Reactions  . Codeine Nausea Only    Family history: A sister had ovarian cancer in her 92s  Social History:   She lives with her husband in [place].  She is retired from [employer].  She quit smoking cigarettes greater than 30 years ago.  No alcohol use.  No transfusion history.  No risk factor for HIV or hepatitis.  She has received 2 Covid vaccines.  ROS:   Positives include: Occasional nausea and vomiting in the mornings, urinary tract infection March 2022, burning on the vaginal/perineal skin following urination, constipation, loose stool, vaginal bleeding, urinary incontinence for years  A complete ROS was otherwise negative.  Physical Exam:  Blood pressure (!) 162/60, pulse 78, temperature 99.5 F (37.5 C), temperature source Tympanic, resp. rate 18, height 5' (1.524 m), weight 149 lb 1.6 oz (67.6 kg), SpO2 100 %.  HEENT: Neck without mass Lungs: Clear bilaterally Cardiac: Regular rate and rhythm Abdomen: No mass, nontender, no hepatosplenomegaly  Vascular: No  leg edema Lymph nodes: No cervical, supraclavicular, axillary, or inguinal nodes Neurologic: Alert and oriented, the motor exam appears intact in the upper and lower extremities bilaterally Skin: No rash Musculoskeletal: No spine tenderness   LAB:  CBC  Lab Results  Component Value Date   WBC 4.7 01/22/2021   HGB 10.9 (L) 01/22/2021   HCT 35.5 (L) 01/22/2021   MCV 89.0 01/22/2021   PLT 264 01/22/2021   NEUTROABS 7.9 (H) 01/18/2021        CMP  Lab Results  Component Value Date   NA 141 01/22/2021   K 3.8 01/22/2021   CL 113 (H) 01/22/2021   CO2 20 (L) 01/22/2021   GLUCOSE 92 01/22/2021   BUN 6 (L) 01/22/2021   CREATININE 0.80 01/22/2021   CALCIUM 8.5 (L) 01/22/2021   PROT 6.5 01/22/2021   ALBUMIN 2.8 (L) 01/22/2021   AST 19 01/22/2021   ALT 14 01/22/2021   ALKPHOS 105 01/22/2021   BILITOT 0.5 01/22/2021    GFRNONAA >60 01/22/2021   GFRAA >60 03/29/2020    Imaging:  As per HPI   Assessment/Plan:   1. Rectal cancer  Partially obstructing mass beginning at 10 cm from the anal verge on colonoscopy 01/23/2021, biopsy-adenocarcinoma  CT abdomen/pelvis 01/18/2021-bilateral hydronephrosis, endometrial fluid collection, prominent stool in the colon  MRI pelvis 01/23/2021-tumor at 7.6 cm from the anal verge, abnormal signal bridge in the cervix, mesorectum, and anterior aspect of the rectum-similar to MRI from 2020, MRI stage could not be defined secondary to posttreatment changes in the pelvis    2. Bilateral hydronephrosis-secondary to urinary retention? 3. Hematometra found on pelvic MRI 01/23/2021, evaluated by gynecologic oncology 4. Cervical cancer November 1994, stage Ib, treated with external beam radiation and brachytherapy at [facility] 5. Diabetes 6. Neuropathy 7. Chronic pain secondary to #6 8. Recurrent urinary tract infections 9. History of a CVA 10. History of hyperthyroidism 11. G3, P3 12. COVID-19 infection January 2021   Disposition:   Ms. Bayless has been diagnosed with rectal cancer.  She appears to have early stage disease based on the staging evaluation to date.  I discussed the treatment of rectal cancer with Ms. Lefebre and her daughter.  She understands an accurate stage T and N could not be determined on the pelvic MRI due to treatment changes in the pelvis.  It is possible the rectal cancer is a secondary malignancy related to pelvic radiation in the 1990s.  She may not be a candidate for further pelvic radiation.  Her case will be presented at the GI tumor conference tomorrow.  Ms. Gomm understands she may need to undergo additional staging evaluation to include a rectal ultrasound and staging PET scan.  We discussed options for treating early stage rectal cancer including upfront surgery and neoadjuvant chemotherapy/radiation.  She is scheduled to see Drs. Moody and white  over the next few days.  We will be in contact with her following the tumor conference discussion tomorrow.  She will return for an office visit in approximately 3 weeks.  Betsy Coder, MD  01/29/2021, 5:08 PM

## 2021-01-29 NOTE — Telephone Encounter (Signed)
Scheduled per los. Gave avs and calendar  

## 2021-01-30 ENCOUNTER — Telehealth: Payer: Self-pay

## 2021-01-30 ENCOUNTER — Ambulatory Visit
Admission: RE | Admit: 2021-01-30 | Discharge: 2021-01-30 | Disposition: A | Discharge status: Home / Self Care | Payer: Medicare HMO | Source: Ambulatory Visit | Attending: Radiation Oncology | Admitting: Radiation Oncology

## 2021-01-30 ENCOUNTER — Ambulatory Visit: Payer: Medicare HMO

## 2021-01-30 ENCOUNTER — Other Ambulatory Visit: Payer: Self-pay

## 2021-01-30 NOTE — Progress Notes (Signed)
The patient and her daughter were contacted by phone today. Her case was discussed in conference. She was previously treated at [facility] for cervical cancer in 1993 and received pelvic radiotherapy. She has reportedly been NED since. She recently however was diagnosed with a rectal carcinoma and her case was discussed today in multidisciplinary GI conference. She has a intrauterine fluid collection and it was recommened she proceed with D&C, and will meet with GYN Oncology next week to coordinate this. She is also seeing Dr. Dema Severin next Wednesday. In discussion this morning, CRS felt that she would likely be a surgical candidate upfront, followed by adjuvant treatment if needed. Radiotherapy can be considered selectively if she had high risk features from pathology, ie nodes, positive margin, etc. The patient understands that because of these plans she does not need to come to the office today for consultation.      Carola Rhine, PAC

## 2021-01-30 NOTE — Progress Notes (Signed)
Left voice message for patient's daughter [name] to let her know results of Tumor Board discussion this morning.  The recommendation is to proceed with upfront surgery, no role for chemotherapy prior to surgery.   I let my direct contact number to call back if she has any questions.

## 2021-01-30 NOTE — Progress Notes (Signed)
GI Location of Tumor / Histology: Rectal Cancer  FLORIA BRANDAU presented to ER on 01/18/2021 with complaints of lower abdominal pain and fever.  No radiation treatments needed.  MRI Pelvis 01/23/2021: Precise rectal staging is difficult due to the presence of treated cervical cancer and disruption of normal anatomic boundaries in the Pelvis.  Slight interval increase size of nodular areas along the mesorectum associated with prior cervical cancer. This does raise the question of disease recurrence particularly given worsening distension of the bilateral ureters and associated hydronephrosis seen on recent CT.  Colonoscopy 01/23/2021: malignant appearing stenosis at the rectosigmoid that was not traversed.  Renal ultrasound on 01/20/2021: Mild left and right hydronephrosis and normal renal echogenicity.  Pelvic ultrasound 01/18/2021: The endometrial cavity to be distended by a heterogenous fluid collection with no discrete mass.  The ovaries were not visualized.    CT Abd/Pelvis 01/18/2021: Bilateral renal cortical thinning, bilateral hydronephrosis and hydroureter with wall thickening and enhancement of the ureters and renal pelvis.  Mild prominence of stool throughout the colon.  No adenopathy.  A large fluid attenuation structure with a well defined rim was noted in the central pelvis adjacent to the cranial aspect of the bladder felt to represent the endometrial cavity of the uterus.    Colonoscopy 01/02/2021: incomplete due fecal impaction.  Repeat scheduled for 01/25/2021.  Biopsies of Rectal Mass 01/23/2021   Past/Anticipated interventions by surgeon, if any:   Past/Anticipated interventions by medical oncology, if any:  Dr. Benay Spice 01/29/2021 -She appears to have early stage rectal cancer disease based on the staging evaluation to date.   -She understands an accurate stage T and N could not be determined on the pelvic MRI due to treatment changes in the pelvis.   -It is possible the rectal  cancer is a secondary malignancy related to pelvic radiation in the 1990s.  She may not be a candidate for further pelvic radiation. -We discussed options for treating early stage rectal cancer including upfront surgery and neoadjuvant chemotherapy/radiation. -She is scheduled to see Drs. Moody and white over the next few days.  We will be in contact with her following the tumor conference discussion tomorrow.   Weight changes, if any:   Bowel/Bladder complaints, if any:   Nausea / Vomiting, if any:   Pain issues, if any:    Any blood per rectum:     SAFETY ISSUES: Prior radiation? Pelvic, 1990's Pacemaker/ICD?  Possible current pregnancy? Postmenopausal Is the patient on methotrexate?   Current Complaints/Details: -History of Cervical cancer

## 2021-01-30 NOTE — Progress Notes (Signed)
Met with patient and her daughter Maudie Mercury at her initial medical oncology consult with Dr. Julieanne Manson.  I explained my role as nurse navigator and they were given my direct contact information.  I have encouraged them to call with any questions or concerns. They verbalized an understanding that her case will be discussed at our Tumor Board tomorrow morning and I will call her daughter with the recommendations from this.

## 2021-01-30 NOTE — Progress Notes (Signed)
The proposed treatment discussed in conference is for discussion purposes only and is not a binding recommendation.  The patients have not been physically examined, or presented with their treatment options.  Therefore, final treatment plans cannot be decided.

## 2021-01-30 NOTE — Telephone Encounter (Signed)
Daughter [name] called and said that she is working on 02-08-21. She cannot bring her mother in to see Dr. Berline Lopes that day. She is available on 3-18;23;24. Told her that Dr. Berline Lopes will do a phone visit on Thursday 02-07-21 at 1530 to discuss plan of care. [name] verbalized understanding.

## 2021-02-06 ENCOUNTER — Ambulatory Visit: Payer: Medicare HMO | Admitting: Gastroenterology

## 2021-02-06 DIAGNOSIS — Z8541 Personal history of malignant neoplasm of cervix uteri: Secondary | ICD-10-CM | POA: Diagnosis not present

## 2021-02-06 DIAGNOSIS — C20 Malignant neoplasm of rectum: Secondary | ICD-10-CM | POA: Diagnosis not present

## 2021-02-06 DIAGNOSIS — I639 Cerebral infarction, unspecified: Secondary | ICD-10-CM | POA: Diagnosis not present

## 2021-02-07 ENCOUNTER — Inpatient Hospital Stay (HOSPITAL_BASED_OUTPATIENT_CLINIC_OR_DEPARTMENT_OTHER): Payer: Medicare HMO | Admitting: Gynecologic Oncology

## 2021-02-07 ENCOUNTER — Encounter: Payer: Self-pay | Admitting: Gynecologic Oncology

## 2021-02-07 DIAGNOSIS — C20 Malignant neoplasm of rectum: Secondary | ICD-10-CM | POA: Diagnosis not present

## 2021-02-07 NOTE — Progress Notes (Signed)
Gynecologic Oncology Telehealth Consult Note: Gyn-Onc  I connected with [name] on 02/07/21 at  3:30 PM EDT by telephone and verified that I am speaking with the correct person using two identifiers.  I discussed the limitations, risks, security and privacy concerns of performing an evaluation and management service by telemedicine and the availability of in-person appointments. I also discussed with the patient that there may be a patient responsible charge related to this service. The patient expressed understanding and agreed to proceed.  Other persons participating in the visit and their role in the encounter: daughter, Juluis Rainier.  Patient's location: home  Reason for Visit: hospital follow-up, treatment planning  Treatment History: Oncology History  Rectal cancer ([place])  01/29/2021 Initial Diagnosis   Rectal cancer ([place])   01/29/2021 Cancer Staging   Staging form: Colon and Rectum, AJCC 8th Edition - Clinical: Stage Unknown (cTX, cNX, cM0) - Signed by Ladell Pier, MD on 01/29/2021     Interval History: Spoke with the patient first and then with her daughter and granddaughter. [name] reports that things have been going well since discharge. She endorses having a good appetite, denies nausea/emesis. She is taking Mirilax daily and having regular bowel. She denies having abdominal or pelvic pain. Reports normal urination. She denies vaginal bleeding, discharge.   Past Medical/Surgical History: Past Medical History:  Diagnosis Date  . Arthritis   . Basal ganglia stroke ([place])   . Bilateral lower extremity edema   . Cancer ([place])   . Carotid artery occlusion   . Flank pain   . Full dentures   . History of cervical cancer    11/ 1994  Stage IB  s/p  high dose radiation brachytherapy @ [facility] 01/ 1995--- per pt no recurrence  . History of chronic bronchitis   . History of hyperthyroidism    d 03/ 2011---due to grave's disease--- s/p  RAI  treatment 04/ 2011  . History of kidney stones   . History of sepsis 05/03/2018   due to UTI with klebsiella/ pyelonephritis/ ureteral obstruction cause by stone  . Hyperlipidemia   . Hypothyroidism, postradioiodine therapy    endocrinologist--  dr Loanne Drilling--  dx graves disease and s/p RAI i131 treatement 4/ 2011  . Nauseated   . Oral thrush 05/03/2018  . Pneumonia   . Type 2 diabetes mellitus treated with insulin ([place])    FOLLOWED BY PCP  . Urgency of urination   . Wears glasses     Past Surgical History:  Procedure Laterality Date  . APPENDECTOMY    . BIOPSY  01/23/2021   Procedure: BIOPSY;  Surgeon: Mauri Pole, MD;  Location: [facility];  Service: Endoscopy;;  . CAROTID ENDARTERECTOMY Right 03/28/2020  . CATARACT EXTRACTION W/ INTRAOCULAR LENS  IMPLANT, BILATERAL  2004  approx.  . COLONOSCOPY WITH PROPOFOL N/A 01/23/2021   Procedure: COLONOSCOPY WITH PROPOFOL;  Surgeon: Mauri Pole, MD;  Location: [facility];  Service: Endoscopy;  Laterality: N/A;  . CYSTOSCOPY WITH STENT PLACEMENT Right 05/04/2018   Procedure: CYSTOSCOPY WITH STENT PLACEMENT AND RETROGRADE PYELOGRAM;  Surgeon: Ceasar Mons, MD;  Location: [facility];  Service: Urology;  Laterality: Right;  . CYSTOSCOPY/URETEROSCOPY/HOLMIUM LASER/STENT PLACEMENT Right 05/19/2018   Procedure: CYSTOSCOPY, URETEROSCOPY/HOLMIUM LASER, STONE BASKETRY/ STENT EXCHANGE;  Surgeon: Ceasar Mons, MD;  [facility];  Service: Urology;  Laterality: Right;  . ENDARTERECTOMY Right 03/28/2020   Procedure: RIGHT CAROTID ENDARTERECTOMY with PATCH ANGIOPLASTY;  Surgeon: Rosetta Posner, MD;  Location: [facility] OR;  Service: Vascular;  Laterality: Right;  . EXCISION MASS LEFT CHEST WALL  09-20-2007   dr Grandville Silos  [facility]  . Radioactive Iodine Therapy     for thyroid  . REVISION TOTAL HIP ARTHROPLASTY Left early 2000s  . SUBMUCOSAL TATTOO INJECTION  01/23/2021   Procedure: SUBMUCOSAL TATTOO INJECTION;   Surgeon: Mauri Pole, MD;  Location: [facility];  Service: Endoscopy;;  . TANDEM RING INSERTION  1995   dr Aldean Ast @ [facility]   EUA w/ tandem placement in ovid  (for direct high dose radiation brachytherapy , cervical cancer)  . TOTAL HIP ARTHROPLASTY Left 1990s  . TRANSTHORACIC ECHOCARDIOGRAM  08/07/2017   mild focal basal hypertrophy of the septum,  ef 07-37%, grade 1 diastolic dysfunction/  atrial septum with lipomatous hypertrophy/  trivial PR    Family History  Problem Relation Age of Onset  . Emphysema Father   . Diabetes Father   . Emphysema Sister   . Emphysema Brother   . Cancer Mother        Skin Cancer  . Diabetes Mother   . Colon cancer Neg Hx   . Esophageal cancer Neg Hx   . Rectal cancer Neg Hx   . Stomach cancer Neg Hx     Social History   Socioeconomic History  . Marital status: Married    Spouse name: Not on file  . Number of children: 2  . Years of education: Not on file  . Highest education level: Not on file  Occupational History  . Occupation: Disabled  Tobacco Use  . Smoking status: Former Smoker    Years: 5.00    Types: Cigarettes    Quit date: 05/13/1982    Years since quitting: 38.7  . Smokeless tobacco: Never Used  Vaping Use  . Vaping Use: Never used  Substance and Sexual Activity  . Alcohol use: No  . Drug use: No  . Sexual activity: Not Currently  Other Topics Concern  . Not on file  Social History Narrative   Married and lives with husband   Has 2 children    Housewife   Disabled   2-3 cups of caffeine    R handed    Social Determinants of Health   Financial Resource Strain: Not on file  Food Insecurity: Not on file  Transportation Needs: Not on file  Physical Activity: Not on file  Stress: Not on file  Social Connections: Not on file    Current Medications:  Current Outpatient Medications:  .  aspirin EC 81 MG EC tablet, Take 1 tablet (81 mg total) by mouth daily. (Patient not taking: No sig reported),  Disp: 30 tablet, Rfl: 1 .  atorvastatin (LIPITOR) 40 MG tablet, Take 1 tablet (40 mg total) by mouth daily at 6 PM. (Patient taking differently: Take 40 mg by mouth at bedtime.), Disp: 30 tablet, Rfl: 1 .  baclofen (LIORESAL) 10 MG tablet, Take 10 mg by mouth 4 (four) times daily as needed for muscle spasms., Disp: , Rfl:  .  clopidogrel (PLAVIX) 75 MG tablet, Take 1 tablet (75 mg total) by mouth daily., Disp: 21 tablet, Rfl: 0 .  ergocalciferol (VITAMIN D2) 50000 UNITS capsule, Take 50,000 Units by mouth every Sunday. , Disp: , Rfl:  .  gemfibrozil (LOPID) 600 MG tablet, Take 600 mg by mouth 2 (two) times daily., Disp: , Rfl:  .  insulin degludec (TRESIBA FLEXTOUCH) 100 UNIT/ML FlexTouch Pen, Inject 30 Units into the skin at bedtime. (  Patient taking differently: Inject 40 Units into the skin at bedtime.), Disp: 3 mL, Rfl: 0 .  levothyroxine (SYNTHROID) 100 MCG tablet, Take 1 tablet (100 mcg total) by mouth daily before breakfast. (Patient taking differently: Take 100 mcg by mouth once a week.), Disp: 30 tablet, Rfl: 1 .  metFORMIN (GLUCOPHAGE) 500 MG tablet, Take 500 mg by mouth 2 (two) times daily with a meal., Disp: , Rfl:  .  methocarbamol (ROBAXIN) 500 MG tablet, Take 500 mg by mouth 2 (two) times daily as needed for muscle spasms., Disp: , Rfl:  .  ondansetron (ZOFRAN-ODT) 8 MG disintegrating tablet, Take 8 mg by mouth 3 (three) times daily., Disp: , Rfl:  .  oxybutynin (DITROPAN-XL) 10 MG 24 hr tablet, Take 10 mg by mouth daily., Disp: , Rfl:  .  oxyCODONE ER (XTAMPZA ER) 9 MG C12A, Take 9 mg by mouth every 12 (twelve) hours., Disp: , Rfl:  .  pantoprazole (PROTONIX) 40 MG tablet, Take 1 tablet (40 mg total) by mouth daily., Disp: 30 tablet, Rfl: 3 .  triamcinolone cream (KENALOG) 0.1 %, Apply 1 application topically daily as needed (vaginal itching). Mix with nystatin cream, Disp: , Rfl:   Review of Symptoms: Pertinent positive as per HPI.  Physical Exam: There were no vitals taken for  this visit. Deferred given limitations of phone visit  Laboratory & Radiologic Studies: None new  Assessment & Plan: WILLARD MADRIGAL is a 75 y.o. woman with remote history of cervix cancer treated with primary radiation now with rectal adenocarcinoma.   Patient is doing well since discharge. She was presented at multidisciplinary conference and has now seen Dr. Benay Spice and Dr. Dema Severin. Given locally advanced cancer and her prior pelvic RT, surgery would likely require a pelvic exenteration. On Mri, there appears to be both cervical and ureteral involvement by the tumor. Given complex history and surgical need, Dr. Dema Severin recommended referral to tertiary center. A referral has been made to [facility].  I reviewed what surgery could involve, morbidity and mortality associated with pelvic exenteration. The family had many questions about disease course and prognosis in terms of her cancer - I deferred these to someone who treats rectal cancer. We also discussed the option of deferring a significant surgery and focusing on quality of life (with possibility of stool diversion to treat or prevent rectal obstruction).  They have my office number and will call if I can be of any help as they make plans for a consultation at [place].  I discussed the assessment and treatment plan with the patient. The patient was provided with an opportunity to ask questions and all were answered. The patient agreed with the plan and demonstrated an understanding of the instructions.   The patient was advised to call back or see an in-person evaluation if the symptoms worsen or if the condition fails to improve as anticipated.   38 minutes of total time was spent for this patient encounter, including preparation, face-to-face counseling with the patient and coordination of care, and documentation of the encounter.   Jeral Pinch, MD  Division of Gynecologic Oncology  Department of Obstetrics and Gynecology  [facility]  [facility]

## 2021-02-08 ENCOUNTER — Ambulatory Visit: Payer: Medicare HMO | Admitting: Gynecologic Oncology

## 2021-02-08 ENCOUNTER — Other Ambulatory Visit: Payer: Self-pay | Admitting: Surgery

## 2021-02-08 DIAGNOSIS — C20 Malignant neoplasm of rectum: Secondary | ICD-10-CM

## 2021-02-12 DIAGNOSIS — C20 Malignant neoplasm of rectum: Secondary | ICD-10-CM | POA: Diagnosis not present

## 2021-02-12 DIAGNOSIS — N857 Hematometra: Secondary | ICD-10-CM | POA: Diagnosis not present

## 2021-02-12 DIAGNOSIS — N1330 Unspecified hydronephrosis: Secondary | ICD-10-CM | POA: Diagnosis not present

## 2021-02-18 DIAGNOSIS — E119 Type 2 diabetes mellitus without complications: Secondary | ICD-10-CM | POA: Diagnosis not present

## 2021-02-18 DIAGNOSIS — N1330 Unspecified hydronephrosis: Secondary | ICD-10-CM | POA: Diagnosis not present

## 2021-02-18 DIAGNOSIS — K5900 Constipation, unspecified: Secondary | ICD-10-CM | POA: Diagnosis not present

## 2021-02-18 DIAGNOSIS — M1990 Unspecified osteoarthritis, unspecified site: Secondary | ICD-10-CM | POA: Diagnosis not present

## 2021-02-18 DIAGNOSIS — I700 Atherosclerosis of aorta: Secondary | ICD-10-CM | POA: Diagnosis not present

## 2021-02-18 DIAGNOSIS — E785 Hyperlipidemia, unspecified: Secondary | ICD-10-CM | POA: Diagnosis not present

## 2021-02-18 DIAGNOSIS — Z8541 Personal history of malignant neoplasm of cervix uteri: Secondary | ICD-10-CM | POA: Diagnosis not present

## 2021-02-18 DIAGNOSIS — C189 Malignant neoplasm of colon, unspecified: Secondary | ICD-10-CM | POA: Diagnosis not present

## 2021-02-18 DIAGNOSIS — E039 Hypothyroidism, unspecified: Secondary | ICD-10-CM | POA: Diagnosis not present

## 2021-02-18 DIAGNOSIS — C538 Malignant neoplasm of overlapping sites of cervix uteri: Secondary | ICD-10-CM | POA: Insufficient documentation

## 2021-02-18 DIAGNOSIS — I2510 Atherosclerotic heart disease of native coronary artery without angina pectoris: Secondary | ICD-10-CM | POA: Diagnosis not present

## 2021-02-18 DIAGNOSIS — C20 Malignant neoplasm of rectum: Secondary | ICD-10-CM | POA: Diagnosis not present

## 2021-02-18 DIAGNOSIS — N857 Hematometra: Secondary | ICD-10-CM | POA: Diagnosis not present

## 2021-02-18 DIAGNOSIS — R918 Other nonspecific abnormal finding of lung field: Secondary | ICD-10-CM | POA: Diagnosis not present

## 2021-02-19 ENCOUNTER — Other Ambulatory Visit: Payer: Medicare HMO

## 2021-02-20 ENCOUNTER — Ambulatory Visit: Payer: Medicare HMO | Admitting: Oncology

## 2021-02-22 DIAGNOSIS — L03115 Cellulitis of right lower limb: Secondary | ICD-10-CM | POA: Diagnosis not present

## 2021-02-22 DIAGNOSIS — C20 Malignant neoplasm of rectum: Secondary | ICD-10-CM | POA: Diagnosis not present

## 2021-03-06 ENCOUNTER — Encounter: Payer: Medicare HMO | Admitting: Gastroenterology

## 2021-03-11 DIAGNOSIS — C189 Malignant neoplasm of colon, unspecified: Secondary | ICD-10-CM | POA: Diagnosis not present

## 2021-03-11 DIAGNOSIS — I2510 Atherosclerotic heart disease of native coronary artery without angina pectoris: Secondary | ICD-10-CM | POA: Diagnosis not present

## 2021-03-11 DIAGNOSIS — E039 Hypothyroidism, unspecified: Secondary | ICD-10-CM | POA: Diagnosis not present

## 2021-03-11 DIAGNOSIS — E119 Type 2 diabetes mellitus without complications: Secondary | ICD-10-CM | POA: Diagnosis not present

## 2021-03-11 DIAGNOSIS — Z139 Encounter for screening, unspecified: Secondary | ICD-10-CM | POA: Diagnosis not present

## 2021-03-11 DIAGNOSIS — Z7902 Long term (current) use of antithrombotics/antiplatelets: Secondary | ICD-10-CM | POA: Diagnosis not present

## 2021-03-11 DIAGNOSIS — C20 Malignant neoplasm of rectum: Secondary | ICD-10-CM | POA: Diagnosis not present

## 2021-03-11 DIAGNOSIS — D630 Anemia in neoplastic disease: Secondary | ICD-10-CM | POA: Diagnosis not present

## 2021-03-11 DIAGNOSIS — Z01818 Encounter for other preprocedural examination: Secondary | ICD-10-CM | POA: Diagnosis not present

## 2021-03-11 DIAGNOSIS — R9431 Abnormal electrocardiogram [ECG] [EKG]: Secondary | ICD-10-CM | POA: Diagnosis not present

## 2021-03-11 DIAGNOSIS — I10 Essential (primary) hypertension: Secondary | ICD-10-CM | POA: Diagnosis not present

## 2021-03-11 DIAGNOSIS — I639 Cerebral infarction, unspecified: Secondary | ICD-10-CM | POA: Diagnosis not present

## 2021-03-11 DIAGNOSIS — E785 Hyperlipidemia, unspecified: Secondary | ICD-10-CM | POA: Diagnosis not present

## 2021-03-11 DIAGNOSIS — Z7189 Other specified counseling: Secondary | ICD-10-CM | POA: Diagnosis not present

## 2021-03-11 DIAGNOSIS — M1990 Unspecified osteoarthritis, unspecified site: Secondary | ICD-10-CM | POA: Diagnosis not present

## 2021-03-13 DIAGNOSIS — Z79891 Long term (current) use of opiate analgesic: Secondary | ICD-10-CM | POA: Diagnosis not present

## 2021-03-13 DIAGNOSIS — I2510 Atherosclerotic heart disease of native coronary artery without angina pectoris: Secondary | ICD-10-CM | POA: Insufficient documentation

## 2021-03-13 DIAGNOSIS — F1120 Opioid dependence, uncomplicated: Secondary | ICD-10-CM | POA: Diagnosis not present

## 2021-03-14 ENCOUNTER — Telehealth: Payer: Self-pay | Admitting: *Deleted

## 2021-03-14 NOTE — Telephone Encounter (Signed)
[name], will call back to schedule he follow up visit,after surgery.

## 2021-03-18 DIAGNOSIS — E119 Type 2 diabetes mellitus without complications: Secondary | ICD-10-CM | POA: Diagnosis not present

## 2021-03-18 DIAGNOSIS — N882 Stricture and stenosis of cervix uteri: Secondary | ICD-10-CM | POA: Diagnosis not present

## 2021-03-18 DIAGNOSIS — I959 Hypotension, unspecified: Secondary | ICD-10-CM | POA: Diagnosis not present

## 2021-03-18 DIAGNOSIS — G8929 Other chronic pain: Secondary | ICD-10-CM | POA: Diagnosis not present

## 2021-03-18 DIAGNOSIS — N736 Female pelvic peritoneal adhesions (postinfective): Secondary | ICD-10-CM | POA: Diagnosis not present

## 2021-03-18 DIAGNOSIS — Z79899 Other long term (current) drug therapy: Secondary | ICD-10-CM | POA: Diagnosis not present

## 2021-03-18 DIAGNOSIS — E039 Hypothyroidism, unspecified: Secondary | ICD-10-CM | POA: Diagnosis not present

## 2021-03-18 DIAGNOSIS — E11649 Type 2 diabetes mellitus with hypoglycemia without coma: Secondary | ICD-10-CM | POA: Diagnosis not present

## 2021-03-18 DIAGNOSIS — R4189 Other symptoms and signs involving cognitive functions and awareness: Secondary | ICD-10-CM | POA: Diagnosis not present

## 2021-03-18 DIAGNOSIS — C7982 Secondary malignant neoplasm of genital organs: Secondary | ICD-10-CM | POA: Diagnosis not present

## 2021-03-18 DIAGNOSIS — C530 Malignant neoplasm of endocervix: Secondary | ICD-10-CM | POA: Diagnosis not present

## 2021-03-18 DIAGNOSIS — D630 Anemia in neoplastic disease: Secondary | ICD-10-CM | POA: Diagnosis not present

## 2021-03-18 DIAGNOSIS — C539 Malignant neoplasm of cervix uteri, unspecified: Secondary | ICD-10-CM | POA: Diagnosis not present

## 2021-03-18 DIAGNOSIS — C538 Malignant neoplasm of overlapping sites of cervix uteri: Secondary | ICD-10-CM | POA: Diagnosis not present

## 2021-03-18 DIAGNOSIS — G8918 Other acute postprocedural pain: Secondary | ICD-10-CM | POA: Diagnosis not present

## 2021-03-18 DIAGNOSIS — N179 Acute kidney failure, unspecified: Secondary | ICD-10-CM | POA: Diagnosis not present

## 2021-03-18 DIAGNOSIS — E1165 Type 2 diabetes mellitus with hyperglycemia: Secondary | ICD-10-CM | POA: Diagnosis not present

## 2021-03-18 DIAGNOSIS — N131 Hydronephrosis with ureteral stricture, not elsewhere classified: Secondary | ICD-10-CM | POA: Diagnosis not present

## 2021-03-18 DIAGNOSIS — Z923 Personal history of irradiation: Secondary | ICD-10-CM | POA: Diagnosis not present

## 2021-03-18 DIAGNOSIS — N895 Stricture and atresia of vagina: Secondary | ICD-10-CM | POA: Diagnosis not present

## 2021-03-18 DIAGNOSIS — C20 Malignant neoplasm of rectum: Secondary | ICD-10-CM | POA: Diagnosis not present

## 2021-03-18 DIAGNOSIS — E1142 Type 2 diabetes mellitus with diabetic polyneuropathy: Secondary | ICD-10-CM | POA: Diagnosis not present

## 2021-03-18 DIAGNOSIS — N135 Crossing vessel and stricture of ureter without hydronephrosis: Secondary | ICD-10-CM | POA: Diagnosis not present

## 2021-03-18 DIAGNOSIS — R69 Illness, unspecified: Secondary | ICD-10-CM | POA: Diagnosis not present

## 2021-03-22 DIAGNOSIS — C20 Malignant neoplasm of rectum: Secondary | ICD-10-CM | POA: Diagnosis not present

## 2021-03-25 DIAGNOSIS — D630 Anemia in neoplastic disease: Secondary | ICD-10-CM | POA: Diagnosis not present

## 2021-03-25 DIAGNOSIS — E039 Hypothyroidism, unspecified: Secondary | ICD-10-CM | POA: Diagnosis not present

## 2021-03-25 DIAGNOSIS — C538 Malignant neoplasm of overlapping sites of cervix uteri: Secondary | ICD-10-CM | POA: Diagnosis not present

## 2021-03-25 DIAGNOSIS — I7090 Unspecified atherosclerosis: Secondary | ICD-10-CM | POA: Diagnosis not present

## 2021-03-25 DIAGNOSIS — I2510 Atherosclerotic heart disease of native coronary artery without angina pectoris: Secondary | ICD-10-CM | POA: Diagnosis not present

## 2021-03-25 DIAGNOSIS — Z483 Aftercare following surgery for neoplasm: Secondary | ICD-10-CM | POA: Diagnosis not present

## 2021-03-25 DIAGNOSIS — I10 Essential (primary) hypertension: Secondary | ICD-10-CM | POA: Diagnosis not present

## 2021-03-25 DIAGNOSIS — Z433 Encounter for attention to colostomy: Secondary | ICD-10-CM | POA: Diagnosis not present

## 2021-03-25 DIAGNOSIS — N3281 Overactive bladder: Secondary | ICD-10-CM | POA: Diagnosis not present

## 2021-03-25 DIAGNOSIS — I69351 Hemiplegia and hemiparesis following cerebral infarction affecting right dominant side: Secondary | ICD-10-CM | POA: Diagnosis not present

## 2021-03-25 DIAGNOSIS — C20 Malignant neoplasm of rectum: Secondary | ICD-10-CM | POA: Diagnosis not present

## 2021-03-25 DIAGNOSIS — E119 Type 2 diabetes mellitus without complications: Secondary | ICD-10-CM | POA: Diagnosis not present

## 2021-03-30 DIAGNOSIS — E119 Type 2 diabetes mellitus without complications: Secondary | ICD-10-CM | POA: Diagnosis not present

## 2021-03-30 DIAGNOSIS — I7090 Unspecified atherosclerosis: Secondary | ICD-10-CM | POA: Diagnosis not present

## 2021-03-30 DIAGNOSIS — Z433 Encounter for attention to colostomy: Secondary | ICD-10-CM | POA: Diagnosis not present

## 2021-03-30 DIAGNOSIS — C20 Malignant neoplasm of rectum: Secondary | ICD-10-CM | POA: Diagnosis not present

## 2021-03-30 DIAGNOSIS — I10 Essential (primary) hypertension: Secondary | ICD-10-CM | POA: Diagnosis not present

## 2021-03-30 DIAGNOSIS — I2510 Atherosclerotic heart disease of native coronary artery without angina pectoris: Secondary | ICD-10-CM | POA: Diagnosis not present

## 2021-03-30 DIAGNOSIS — N3281 Overactive bladder: Secondary | ICD-10-CM | POA: Diagnosis not present

## 2021-03-30 DIAGNOSIS — Z483 Aftercare following surgery for neoplasm: Secondary | ICD-10-CM | POA: Diagnosis not present

## 2021-03-30 DIAGNOSIS — I69351 Hemiplegia and hemiparesis following cerebral infarction affecting right dominant side: Secondary | ICD-10-CM | POA: Diagnosis not present

## 2021-03-30 DIAGNOSIS — E039 Hypothyroidism, unspecified: Secondary | ICD-10-CM | POA: Diagnosis not present

## 2021-04-01 ENCOUNTER — Encounter: Payer: Self-pay | Admitting: Podiatry

## 2021-04-01 ENCOUNTER — Other Ambulatory Visit: Payer: Self-pay

## 2021-04-01 ENCOUNTER — Ambulatory Visit: Payer: Medicare HMO | Admitting: Podiatry

## 2021-04-01 DIAGNOSIS — N3281 Overactive bladder: Secondary | ICD-10-CM | POA: Diagnosis not present

## 2021-04-01 DIAGNOSIS — C20 Malignant neoplasm of rectum: Secondary | ICD-10-CM | POA: Diagnosis not present

## 2021-04-01 DIAGNOSIS — M79675 Pain in left toe(s): Secondary | ICD-10-CM | POA: Diagnosis not present

## 2021-04-01 DIAGNOSIS — M79674 Pain in right toe(s): Secondary | ICD-10-CM

## 2021-04-01 DIAGNOSIS — I10 Essential (primary) hypertension: Secondary | ICD-10-CM | POA: Diagnosis not present

## 2021-04-01 DIAGNOSIS — I7090 Unspecified atherosclerosis: Secondary | ICD-10-CM | POA: Diagnosis not present

## 2021-04-01 DIAGNOSIS — E1142 Type 2 diabetes mellitus with diabetic polyneuropathy: Secondary | ICD-10-CM | POA: Diagnosis not present

## 2021-04-01 DIAGNOSIS — B351 Tinea unguium: Secondary | ICD-10-CM

## 2021-04-01 DIAGNOSIS — E039 Hypothyroidism, unspecified: Secondary | ICD-10-CM | POA: Diagnosis not present

## 2021-04-01 DIAGNOSIS — I2510 Atherosclerotic heart disease of native coronary artery without angina pectoris: Secondary | ICD-10-CM | POA: Diagnosis not present

## 2021-04-01 DIAGNOSIS — L84 Corns and callosities: Secondary | ICD-10-CM

## 2021-04-01 DIAGNOSIS — Z433 Encounter for attention to colostomy: Secondary | ICD-10-CM | POA: Diagnosis not present

## 2021-04-01 DIAGNOSIS — Z483 Aftercare following surgery for neoplasm: Secondary | ICD-10-CM | POA: Diagnosis not present

## 2021-04-01 DIAGNOSIS — E119 Type 2 diabetes mellitus without complications: Secondary | ICD-10-CM | POA: Diagnosis not present

## 2021-04-01 DIAGNOSIS — I69351 Hemiplegia and hemiparesis following cerebral infarction affecting right dominant side: Secondary | ICD-10-CM | POA: Diagnosis not present

## 2021-04-02 DIAGNOSIS — I7090 Unspecified atherosclerosis: Secondary | ICD-10-CM | POA: Diagnosis not present

## 2021-04-02 DIAGNOSIS — E039 Hypothyroidism, unspecified: Secondary | ICD-10-CM | POA: Diagnosis not present

## 2021-04-02 DIAGNOSIS — Z483 Aftercare following surgery for neoplasm: Secondary | ICD-10-CM | POA: Diagnosis not present

## 2021-04-02 DIAGNOSIS — Z433 Encounter for attention to colostomy: Secondary | ICD-10-CM | POA: Diagnosis not present

## 2021-04-02 DIAGNOSIS — I69351 Hemiplegia and hemiparesis following cerebral infarction affecting right dominant side: Secondary | ICD-10-CM | POA: Diagnosis not present

## 2021-04-02 DIAGNOSIS — E119 Type 2 diabetes mellitus without complications: Secondary | ICD-10-CM | POA: Diagnosis not present

## 2021-04-02 DIAGNOSIS — I2510 Atherosclerotic heart disease of native coronary artery without angina pectoris: Secondary | ICD-10-CM | POA: Diagnosis not present

## 2021-04-02 DIAGNOSIS — N3281 Overactive bladder: Secondary | ICD-10-CM | POA: Diagnosis not present

## 2021-04-02 DIAGNOSIS — I10 Essential (primary) hypertension: Secondary | ICD-10-CM | POA: Diagnosis not present

## 2021-04-02 DIAGNOSIS — C20 Malignant neoplasm of rectum: Secondary | ICD-10-CM | POA: Diagnosis not present

## 2021-04-03 DIAGNOSIS — G894 Chronic pain syndrome: Secondary | ICD-10-CM | POA: Diagnosis not present

## 2021-04-03 DIAGNOSIS — M5417 Radiculopathy, lumbosacral region: Secondary | ICD-10-CM | POA: Diagnosis not present

## 2021-04-03 DIAGNOSIS — R69 Illness, unspecified: Secondary | ICD-10-CM | POA: Diagnosis not present

## 2021-04-03 DIAGNOSIS — G8929 Other chronic pain: Secondary | ICD-10-CM | POA: Diagnosis not present

## 2021-04-03 DIAGNOSIS — M545 Low back pain, unspecified: Secondary | ICD-10-CM | POA: Diagnosis not present

## 2021-04-03 DIAGNOSIS — E089 Diabetes mellitus due to underlying condition without complications: Secondary | ICD-10-CM | POA: Diagnosis not present

## 2021-04-03 DIAGNOSIS — M5440 Lumbago with sciatica, unspecified side: Secondary | ICD-10-CM | POA: Diagnosis not present

## 2021-04-03 DIAGNOSIS — M791 Myalgia, unspecified site: Secondary | ICD-10-CM | POA: Diagnosis not present

## 2021-04-05 DIAGNOSIS — C538 Malignant neoplasm of overlapping sites of cervix uteri: Secondary | ICD-10-CM | POA: Diagnosis not present

## 2021-04-05 DIAGNOSIS — C20 Malignant neoplasm of rectum: Secondary | ICD-10-CM | POA: Diagnosis not present

## 2021-04-07 NOTE — Progress Notes (Signed)
  Subjective:  Patient ID: [name], female    DOB: 10-19-1946,  MRN: [id number]  [name] presents to clinic today for at risk foot care with history of diabetic neuropathy and callus(es) left hallux and painful thick toenails that are difficult to trim. Painful toenails interfere with ambulation. Aggravating factors include wearing enclosed shoe gear. Pain is relieved with periodic professional debridement. Painful calluses are aggravated when weightbearing with and without shoegear. Pain is relieved with periodic professional debridement.   Her son, Jeanell Sparrow, is present during today's visit.  She states her blood glucose was 132 mg/dl today.  PCP is Everardo Beals, NP, and last visit was 9 weeks ago.  She relates no new pedal problems on today's visit.  Allergies  Allergen Reactions  . Codeine Nausea Only    Review of Systems: Negative except as noted in the HPI. Objective:   Constitutional [name] is a pleasant 75 y.o. Caucasian female, in NAD. AAO x 3.   Vascular Capillary fill time to digits <3 seconds b/l lower extremities. Palpable pedal pulses b/l LE. Pedal hair absent. Lower extremity skin temperature gradient within normal limits. No pain with calf compression b/l. No edema noted b/l lower extremities. No cyanosis or clubbing noted.  Neurologic Normal speech. Oriented to person, place, and time. Protective sensation diminished with 10g monofilament b/l. Vibratory sensation decreased b/l. Clonus negative b/l.  Dermatologic Pedal skin with normal turgor, texture and tone bilaterally. No open wounds bilaterally. No interdigital macerations bilaterally. Toenails 1-5 b/l elongated, discolored, dystrophic, thickened, crumbly with subungual debris and tenderness to dorsal palpation. Hyperkeratotic lesion(s) L hallux.  No erythema, no edema, no drainage, no fluctuance.  Orthopedic: Dropfoot b/l lower extremities. No pain crepitus or joint limitation noted with ROM  b/l. Hammertoe(s) noted to the L 2nd toe and R 2nd toe.   Radiographs: None Assessment:   1. Pain due to onychomycosis of toenails of both feet   2. Callus   3. Diabetic peripheral neuropathy associated with type 2 diabetes mellitus ([place])    Plan:  Patient was evaluated and treated and all questions answered.  Onychomycosis with pain -Nails palliatively debridement as below -Educated on self-care  Procedure: Nail Debridement Rationale: Pain Type of Debridement: manual, sharp debridement. Instrumentation: Nail nipper, rotary burr. Number of Nails: 10 -Examined patient. -Continue diabetic foot care principles. -Patient to continue soft, supportive shoe gear daily. -Toenails 1-5 b/l were debrided in length and girth with sterile nail nippers and dremel without iatrogenic bleeding.  -Callus(es) L hallux pared utilizing sterile scalpel blade without complication or incident. Total number debrided =1. -Patient to report any pedal injuries to medical professional immediately. -Patient/POA to call should there be question/concern in the interim.  Return in about 3 months (around 07/02/2021).  [name], DPM

## 2021-04-08 DIAGNOSIS — Z483 Aftercare following surgery for neoplasm: Secondary | ICD-10-CM | POA: Diagnosis not present

## 2021-04-08 DIAGNOSIS — I69351 Hemiplegia and hemiparesis following cerebral infarction affecting right dominant side: Secondary | ICD-10-CM | POA: Diagnosis not present

## 2021-04-08 DIAGNOSIS — E119 Type 2 diabetes mellitus without complications: Secondary | ICD-10-CM | POA: Diagnosis not present

## 2021-04-08 DIAGNOSIS — Z433 Encounter for attention to colostomy: Secondary | ICD-10-CM | POA: Diagnosis not present

## 2021-04-08 DIAGNOSIS — I2510 Atherosclerotic heart disease of native coronary artery without angina pectoris: Secondary | ICD-10-CM | POA: Diagnosis not present

## 2021-04-08 DIAGNOSIS — C20 Malignant neoplasm of rectum: Secondary | ICD-10-CM | POA: Diagnosis not present

## 2021-04-08 DIAGNOSIS — N3281 Overactive bladder: Secondary | ICD-10-CM | POA: Diagnosis not present

## 2021-04-08 DIAGNOSIS — E039 Hypothyroidism, unspecified: Secondary | ICD-10-CM | POA: Diagnosis not present

## 2021-04-08 DIAGNOSIS — I10 Essential (primary) hypertension: Secondary | ICD-10-CM | POA: Diagnosis not present

## 2021-04-08 DIAGNOSIS — I7090 Unspecified atherosclerosis: Secondary | ICD-10-CM | POA: Diagnosis not present

## 2021-04-09 DIAGNOSIS — Z483 Aftercare following surgery for neoplasm: Secondary | ICD-10-CM | POA: Diagnosis not present

## 2021-04-09 DIAGNOSIS — Z433 Encounter for attention to colostomy: Secondary | ICD-10-CM | POA: Diagnosis not present

## 2021-04-09 DIAGNOSIS — I10 Essential (primary) hypertension: Secondary | ICD-10-CM | POA: Diagnosis not present

## 2021-04-09 DIAGNOSIS — I7090 Unspecified atherosclerosis: Secondary | ICD-10-CM | POA: Diagnosis not present

## 2021-04-09 DIAGNOSIS — N3281 Overactive bladder: Secondary | ICD-10-CM | POA: Diagnosis not present

## 2021-04-09 DIAGNOSIS — E119 Type 2 diabetes mellitus without complications: Secondary | ICD-10-CM | POA: Diagnosis not present

## 2021-04-09 DIAGNOSIS — C20 Malignant neoplasm of rectum: Secondary | ICD-10-CM | POA: Diagnosis not present

## 2021-04-09 DIAGNOSIS — E039 Hypothyroidism, unspecified: Secondary | ICD-10-CM | POA: Diagnosis not present

## 2021-04-09 DIAGNOSIS — I69351 Hemiplegia and hemiparesis following cerebral infarction affecting right dominant side: Secondary | ICD-10-CM | POA: Diagnosis not present

## 2021-04-09 DIAGNOSIS — I2510 Atherosclerotic heart disease of native coronary artery without angina pectoris: Secondary | ICD-10-CM | POA: Diagnosis not present

## 2021-04-10 DIAGNOSIS — Z433 Encounter for attention to colostomy: Secondary | ICD-10-CM | POA: Diagnosis not present

## 2021-04-10 DIAGNOSIS — C20 Malignant neoplasm of rectum: Secondary | ICD-10-CM | POA: Diagnosis not present

## 2021-04-10 DIAGNOSIS — Z4889 Encounter for other specified surgical aftercare: Secondary | ICD-10-CM | POA: Diagnosis not present

## 2021-04-10 DIAGNOSIS — C538 Malignant neoplasm of overlapping sites of cervix uteri: Secondary | ICD-10-CM | POA: Diagnosis not present

## 2021-04-11 ENCOUNTER — Other Ambulatory Visit: Payer: Self-pay | Admitting: Gastroenterology

## 2021-04-11 DIAGNOSIS — R1113 Vomiting of fecal matter: Secondary | ICD-10-CM

## 2021-04-11 DIAGNOSIS — R1084 Generalized abdominal pain: Secondary | ICD-10-CM

## 2021-04-11 DIAGNOSIS — C538 Malignant neoplasm of overlapping sites of cervix uteri: Secondary | ICD-10-CM | POA: Diagnosis not present

## 2021-04-11 DIAGNOSIS — C20 Malignant neoplasm of rectum: Secondary | ICD-10-CM | POA: Diagnosis not present

## 2021-04-12 DIAGNOSIS — N3281 Overactive bladder: Secondary | ICD-10-CM | POA: Diagnosis not present

## 2021-04-12 DIAGNOSIS — E039 Hypothyroidism, unspecified: Secondary | ICD-10-CM | POA: Diagnosis not present

## 2021-04-12 DIAGNOSIS — C20 Malignant neoplasm of rectum: Secondary | ICD-10-CM | POA: Diagnosis not present

## 2021-04-12 DIAGNOSIS — Z433 Encounter for attention to colostomy: Secondary | ICD-10-CM | POA: Diagnosis not present

## 2021-04-12 DIAGNOSIS — I2510 Atherosclerotic heart disease of native coronary artery without angina pectoris: Secondary | ICD-10-CM | POA: Diagnosis not present

## 2021-04-12 DIAGNOSIS — I69351 Hemiplegia and hemiparesis following cerebral infarction affecting right dominant side: Secondary | ICD-10-CM | POA: Diagnosis not present

## 2021-04-12 DIAGNOSIS — I10 Essential (primary) hypertension: Secondary | ICD-10-CM | POA: Diagnosis not present

## 2021-04-12 DIAGNOSIS — I7090 Unspecified atherosclerosis: Secondary | ICD-10-CM | POA: Diagnosis not present

## 2021-04-12 DIAGNOSIS — Z483 Aftercare following surgery for neoplasm: Secondary | ICD-10-CM | POA: Diagnosis not present

## 2021-04-12 DIAGNOSIS — E119 Type 2 diabetes mellitus without complications: Secondary | ICD-10-CM | POA: Diagnosis not present

## 2021-04-16 DIAGNOSIS — N3281 Overactive bladder: Secondary | ICD-10-CM | POA: Diagnosis not present

## 2021-04-16 DIAGNOSIS — I2510 Atherosclerotic heart disease of native coronary artery without angina pectoris: Secondary | ICD-10-CM | POA: Diagnosis not present

## 2021-04-16 DIAGNOSIS — I10 Essential (primary) hypertension: Secondary | ICD-10-CM | POA: Diagnosis not present

## 2021-04-16 DIAGNOSIS — E119 Type 2 diabetes mellitus without complications: Secondary | ICD-10-CM | POA: Diagnosis not present

## 2021-04-16 DIAGNOSIS — C20 Malignant neoplasm of rectum: Secondary | ICD-10-CM | POA: Diagnosis not present

## 2021-04-16 DIAGNOSIS — I7090 Unspecified atherosclerosis: Secondary | ICD-10-CM | POA: Diagnosis not present

## 2021-04-16 DIAGNOSIS — Z433 Encounter for attention to colostomy: Secondary | ICD-10-CM | POA: Diagnosis not present

## 2021-04-16 DIAGNOSIS — I69351 Hemiplegia and hemiparesis following cerebral infarction affecting right dominant side: Secondary | ICD-10-CM | POA: Diagnosis not present

## 2021-04-16 DIAGNOSIS — Z483 Aftercare following surgery for neoplasm: Secondary | ICD-10-CM | POA: Diagnosis not present

## 2021-04-16 DIAGNOSIS — E039 Hypothyroidism, unspecified: Secondary | ICD-10-CM | POA: Diagnosis not present

## 2021-04-17 ENCOUNTER — Ambulatory Visit: Payer: Medicare HMO | Admitting: Cardiovascular Disease

## 2021-04-19 DIAGNOSIS — Z433 Encounter for attention to colostomy: Secondary | ICD-10-CM | POA: Diagnosis not present

## 2021-04-23 DIAGNOSIS — Z5181 Encounter for therapeutic drug level monitoring: Secondary | ICD-10-CM | POA: Diagnosis not present

## 2021-04-23 DIAGNOSIS — C20 Malignant neoplasm of rectum: Secondary | ICD-10-CM | POA: Diagnosis not present

## 2021-04-23 DIAGNOSIS — Z79899 Other long term (current) drug therapy: Secondary | ICD-10-CM | POA: Diagnosis not present

## 2021-04-23 DIAGNOSIS — F1120 Opioid dependence, uncomplicated: Secondary | ICD-10-CM | POA: Diagnosis not present

## 2021-04-23 DIAGNOSIS — R590 Localized enlarged lymph nodes: Secondary | ICD-10-CM | POA: Diagnosis not present

## 2021-04-23 DIAGNOSIS — C538 Malignant neoplasm of overlapping sites of cervix uteri: Secondary | ICD-10-CM | POA: Diagnosis not present

## 2021-04-23 DIAGNOSIS — Z79891 Long term (current) use of opiate analgesic: Secondary | ICD-10-CM | POA: Diagnosis not present

## 2021-04-25 DIAGNOSIS — E039 Hypothyroidism, unspecified: Secondary | ICD-10-CM | POA: Diagnosis not present

## 2021-04-25 DIAGNOSIS — C20 Malignant neoplasm of rectum: Secondary | ICD-10-CM | POA: Diagnosis not present

## 2021-04-25 DIAGNOSIS — N3281 Overactive bladder: Secondary | ICD-10-CM | POA: Diagnosis not present

## 2021-04-25 DIAGNOSIS — Z483 Aftercare following surgery for neoplasm: Secondary | ICD-10-CM | POA: Diagnosis not present

## 2021-04-25 DIAGNOSIS — I10 Essential (primary) hypertension: Secondary | ICD-10-CM | POA: Diagnosis not present

## 2021-04-25 DIAGNOSIS — I69351 Hemiplegia and hemiparesis following cerebral infarction affecting right dominant side: Secondary | ICD-10-CM | POA: Diagnosis not present

## 2021-04-25 DIAGNOSIS — I2510 Atherosclerotic heart disease of native coronary artery without angina pectoris: Secondary | ICD-10-CM | POA: Diagnosis not present

## 2021-04-25 DIAGNOSIS — I7090 Unspecified atherosclerosis: Secondary | ICD-10-CM | POA: Diagnosis not present

## 2021-04-25 DIAGNOSIS — Z433 Encounter for attention to colostomy: Secondary | ICD-10-CM | POA: Diagnosis not present

## 2021-04-25 DIAGNOSIS — E119 Type 2 diabetes mellitus without complications: Secondary | ICD-10-CM | POA: Diagnosis not present

## 2021-04-29 DIAGNOSIS — I2510 Atherosclerotic heart disease of native coronary artery without angina pectoris: Secondary | ICD-10-CM | POA: Diagnosis not present

## 2021-04-29 DIAGNOSIS — I10 Essential (primary) hypertension: Secondary | ICD-10-CM | POA: Diagnosis not present

## 2021-04-29 DIAGNOSIS — Z483 Aftercare following surgery for neoplasm: Secondary | ICD-10-CM | POA: Diagnosis not present

## 2021-04-29 DIAGNOSIS — C20 Malignant neoplasm of rectum: Secondary | ICD-10-CM | POA: Diagnosis not present

## 2021-04-29 DIAGNOSIS — I7090 Unspecified atherosclerosis: Secondary | ICD-10-CM | POA: Diagnosis not present

## 2021-04-29 DIAGNOSIS — E119 Type 2 diabetes mellitus without complications: Secondary | ICD-10-CM | POA: Diagnosis not present

## 2021-04-29 DIAGNOSIS — Z433 Encounter for attention to colostomy: Secondary | ICD-10-CM | POA: Diagnosis not present

## 2021-04-29 DIAGNOSIS — N3281 Overactive bladder: Secondary | ICD-10-CM | POA: Diagnosis not present

## 2021-04-29 DIAGNOSIS — I69351 Hemiplegia and hemiparesis following cerebral infarction affecting right dominant side: Secondary | ICD-10-CM | POA: Diagnosis not present

## 2021-04-29 DIAGNOSIS — E039 Hypothyroidism, unspecified: Secondary | ICD-10-CM | POA: Diagnosis not present

## 2021-05-01 DIAGNOSIS — M5440 Lumbago with sciatica, unspecified side: Secondary | ICD-10-CM | POA: Diagnosis not present

## 2021-05-01 DIAGNOSIS — M5417 Radiculopathy, lumbosacral region: Secondary | ICD-10-CM | POA: Diagnosis not present

## 2021-05-01 DIAGNOSIS — E089 Diabetes mellitus due to underlying condition without complications: Secondary | ICD-10-CM | POA: Diagnosis not present

## 2021-05-01 DIAGNOSIS — G8929 Other chronic pain: Secondary | ICD-10-CM | POA: Diagnosis not present

## 2021-05-01 DIAGNOSIS — M791 Myalgia, unspecified site: Secondary | ICD-10-CM | POA: Diagnosis not present

## 2021-05-01 DIAGNOSIS — M545 Low back pain, unspecified: Secondary | ICD-10-CM | POA: Diagnosis not present

## 2021-05-01 DIAGNOSIS — R69 Illness, unspecified: Secondary | ICD-10-CM | POA: Diagnosis not present

## 2021-05-01 DIAGNOSIS — G894 Chronic pain syndrome: Secondary | ICD-10-CM | POA: Diagnosis not present

## 2021-05-02 DIAGNOSIS — E039 Hypothyroidism, unspecified: Secondary | ICD-10-CM | POA: Diagnosis not present

## 2021-05-02 DIAGNOSIS — E119 Type 2 diabetes mellitus without complications: Secondary | ICD-10-CM | POA: Diagnosis not present

## 2021-05-02 DIAGNOSIS — Z433 Encounter for attention to colostomy: Secondary | ICD-10-CM | POA: Diagnosis not present

## 2021-05-02 DIAGNOSIS — I2510 Atherosclerotic heart disease of native coronary artery without angina pectoris: Secondary | ICD-10-CM | POA: Diagnosis not present

## 2021-05-02 DIAGNOSIS — I10 Essential (primary) hypertension: Secondary | ICD-10-CM | POA: Diagnosis not present

## 2021-05-02 DIAGNOSIS — I69351 Hemiplegia and hemiparesis following cerebral infarction affecting right dominant side: Secondary | ICD-10-CM | POA: Diagnosis not present

## 2021-05-02 DIAGNOSIS — I7090 Unspecified atherosclerosis: Secondary | ICD-10-CM | POA: Diagnosis not present

## 2021-05-02 DIAGNOSIS — N3281 Overactive bladder: Secondary | ICD-10-CM | POA: Diagnosis not present

## 2021-05-02 DIAGNOSIS — C20 Malignant neoplasm of rectum: Secondary | ICD-10-CM | POA: Diagnosis not present

## 2021-05-02 DIAGNOSIS — Z483 Aftercare following surgery for neoplasm: Secondary | ICD-10-CM | POA: Diagnosis not present

## 2021-05-03 DIAGNOSIS — N390 Urinary tract infection, site not specified: Secondary | ICD-10-CM | POA: Diagnosis not present

## 2021-05-03 DIAGNOSIS — R1111 Vomiting without nausea: Secondary | ICD-10-CM | POA: Diagnosis not present

## 2021-05-03 DIAGNOSIS — R4182 Altered mental status, unspecified: Secondary | ICD-10-CM | POA: Diagnosis not present

## 2021-05-03 DIAGNOSIS — E119 Type 2 diabetes mellitus without complications: Secondary | ICD-10-CM | POA: Diagnosis not present

## 2021-05-03 DIAGNOSIS — C20 Malignant neoplasm of rectum: Secondary | ICD-10-CM | POA: Diagnosis not present

## 2021-05-03 DIAGNOSIS — R410 Disorientation, unspecified: Secondary | ICD-10-CM | POA: Diagnosis not present

## 2021-05-03 DIAGNOSIS — G9349 Other encephalopathy: Secondary | ICD-10-CM | POA: Diagnosis not present

## 2021-05-03 DIAGNOSIS — L03115 Cellulitis of right lower limb: Secondary | ICD-10-CM | POA: Diagnosis not present

## 2021-05-03 DIAGNOSIS — C538 Malignant neoplasm of overlapping sites of cervix uteri: Secondary | ICD-10-CM | POA: Diagnosis not present

## 2021-05-03 DIAGNOSIS — Z8673 Personal history of transient ischemic attack (TIA), and cerebral infarction without residual deficits: Secondary | ICD-10-CM | POA: Diagnosis not present

## 2021-05-03 DIAGNOSIS — D630 Anemia in neoplastic disease: Secondary | ICD-10-CM | POA: Diagnosis not present

## 2021-05-03 DIAGNOSIS — R519 Headache, unspecified: Secondary | ICD-10-CM | POA: Diagnosis not present

## 2021-05-03 DIAGNOSIS — R9431 Abnormal electrocardiogram [ECG] [EKG]: Secondary | ICD-10-CM | POA: Diagnosis not present

## 2021-05-03 DIAGNOSIS — E039 Hypothyroidism, unspecified: Secondary | ICD-10-CM | POA: Diagnosis not present

## 2021-05-03 DIAGNOSIS — C539 Malignant neoplasm of cervix uteri, unspecified: Secondary | ICD-10-CM | POA: Diagnosis not present

## 2021-05-03 DIAGNOSIS — E785 Hyperlipidemia, unspecified: Secondary | ICD-10-CM | POA: Diagnosis not present

## 2021-05-04 DIAGNOSIS — R519 Headache, unspecified: Secondary | ICD-10-CM | POA: Diagnosis not present

## 2021-05-04 DIAGNOSIS — N3000 Acute cystitis without hematuria: Secondary | ICD-10-CM | POA: Diagnosis not present

## 2021-05-04 DIAGNOSIS — I639 Cerebral infarction, unspecified: Secondary | ICD-10-CM | POA: Diagnosis not present

## 2021-05-04 DIAGNOSIS — R4182 Altered mental status, unspecified: Secondary | ICD-10-CM | POA: Diagnosis not present

## 2021-05-04 DIAGNOSIS — E039 Hypothyroidism, unspecified: Secondary | ICD-10-CM | POA: Diagnosis not present

## 2021-05-05 DIAGNOSIS — C538 Malignant neoplasm of overlapping sites of cervix uteri: Secondary | ICD-10-CM | POA: Diagnosis not present

## 2021-05-05 DIAGNOSIS — E039 Hypothyroidism, unspecified: Secondary | ICD-10-CM | POA: Diagnosis not present

## 2021-05-05 DIAGNOSIS — R4182 Altered mental status, unspecified: Secondary | ICD-10-CM | POA: Diagnosis not present

## 2021-05-05 DIAGNOSIS — N3000 Acute cystitis without hematuria: Secondary | ICD-10-CM | POA: Diagnosis not present

## 2021-05-06 DIAGNOSIS — Z483 Aftercare following surgery for neoplasm: Secondary | ICD-10-CM | POA: Diagnosis not present

## 2021-05-06 DIAGNOSIS — I7090 Unspecified atherosclerosis: Secondary | ICD-10-CM | POA: Diagnosis not present

## 2021-05-06 DIAGNOSIS — I2510 Atherosclerotic heart disease of native coronary artery without angina pectoris: Secondary | ICD-10-CM | POA: Diagnosis not present

## 2021-05-06 DIAGNOSIS — E039 Hypothyroidism, unspecified: Secondary | ICD-10-CM | POA: Diagnosis not present

## 2021-05-06 DIAGNOSIS — I10 Essential (primary) hypertension: Secondary | ICD-10-CM | POA: Diagnosis not present

## 2021-05-06 DIAGNOSIS — I69351 Hemiplegia and hemiparesis following cerebral infarction affecting right dominant side: Secondary | ICD-10-CM | POA: Diagnosis not present

## 2021-05-06 DIAGNOSIS — Z433 Encounter for attention to colostomy: Secondary | ICD-10-CM | POA: Diagnosis not present

## 2021-05-06 DIAGNOSIS — N3281 Overactive bladder: Secondary | ICD-10-CM | POA: Diagnosis not present

## 2021-05-06 DIAGNOSIS — E119 Type 2 diabetes mellitus without complications: Secondary | ICD-10-CM | POA: Diagnosis not present

## 2021-05-06 DIAGNOSIS — C20 Malignant neoplasm of rectum: Secondary | ICD-10-CM | POA: Diagnosis not present

## 2021-05-10 DIAGNOSIS — C20 Malignant neoplasm of rectum: Secondary | ICD-10-CM | POA: Diagnosis not present

## 2021-05-10 DIAGNOSIS — I7090 Unspecified atherosclerosis: Secondary | ICD-10-CM | POA: Diagnosis not present

## 2021-05-10 DIAGNOSIS — I10 Essential (primary) hypertension: Secondary | ICD-10-CM | POA: Diagnosis not present

## 2021-05-10 DIAGNOSIS — G8929 Other chronic pain: Secondary | ICD-10-CM | POA: Diagnosis not present

## 2021-05-10 DIAGNOSIS — Z483 Aftercare following surgery for neoplasm: Secondary | ICD-10-CM | POA: Diagnosis not present

## 2021-05-10 DIAGNOSIS — I69351 Hemiplegia and hemiparesis following cerebral infarction affecting right dominant side: Secondary | ICD-10-CM | POA: Diagnosis not present

## 2021-05-10 DIAGNOSIS — N3281 Overactive bladder: Secondary | ICD-10-CM | POA: Diagnosis not present

## 2021-05-10 DIAGNOSIS — C189 Malignant neoplasm of colon, unspecified: Secondary | ICD-10-CM | POA: Diagnosis not present

## 2021-05-10 DIAGNOSIS — E785 Hyperlipidemia, unspecified: Secondary | ICD-10-CM | POA: Diagnosis not present

## 2021-05-10 DIAGNOSIS — Z933 Colostomy status: Secondary | ICD-10-CM | POA: Diagnosis not present

## 2021-05-10 DIAGNOSIS — C55 Malignant neoplasm of uterus, part unspecified: Secondary | ICD-10-CM | POA: Diagnosis not present

## 2021-05-10 DIAGNOSIS — E1142 Type 2 diabetes mellitus with diabetic polyneuropathy: Secondary | ICD-10-CM | POA: Diagnosis not present

## 2021-05-10 DIAGNOSIS — R69 Illness, unspecified: Secondary | ICD-10-CM | POA: Diagnosis not present

## 2021-05-10 DIAGNOSIS — E039 Hypothyroidism, unspecified: Secondary | ICD-10-CM | POA: Diagnosis not present

## 2021-05-10 DIAGNOSIS — Z794 Long term (current) use of insulin: Secondary | ICD-10-CM | POA: Diagnosis not present

## 2021-05-10 DIAGNOSIS — Z433 Encounter for attention to colostomy: Secondary | ICD-10-CM | POA: Diagnosis not present

## 2021-05-10 DIAGNOSIS — I2510 Atherosclerotic heart disease of native coronary artery without angina pectoris: Secondary | ICD-10-CM | POA: Diagnosis not present

## 2021-05-10 DIAGNOSIS — E119 Type 2 diabetes mellitus without complications: Secondary | ICD-10-CM | POA: Diagnosis not present

## 2021-05-14 DIAGNOSIS — I10 Essential (primary) hypertension: Secondary | ICD-10-CM | POA: Diagnosis not present

## 2021-05-14 DIAGNOSIS — Z483 Aftercare following surgery for neoplasm: Secondary | ICD-10-CM | POA: Diagnosis not present

## 2021-05-14 DIAGNOSIS — E039 Hypothyroidism, unspecified: Secondary | ICD-10-CM | POA: Diagnosis not present

## 2021-05-14 DIAGNOSIS — I7090 Unspecified atherosclerosis: Secondary | ICD-10-CM | POA: Diagnosis not present

## 2021-05-14 DIAGNOSIS — I69351 Hemiplegia and hemiparesis following cerebral infarction affecting right dominant side: Secondary | ICD-10-CM | POA: Diagnosis not present

## 2021-05-14 DIAGNOSIS — C20 Malignant neoplasm of rectum: Secondary | ICD-10-CM | POA: Diagnosis not present

## 2021-05-14 DIAGNOSIS — Z433 Encounter for attention to colostomy: Secondary | ICD-10-CM | POA: Diagnosis not present

## 2021-05-14 DIAGNOSIS — N3281 Overactive bladder: Secondary | ICD-10-CM | POA: Diagnosis not present

## 2021-05-14 DIAGNOSIS — E119 Type 2 diabetes mellitus without complications: Secondary | ICD-10-CM | POA: Diagnosis not present

## 2021-05-14 DIAGNOSIS — I2510 Atherosclerotic heart disease of native coronary artery without angina pectoris: Secondary | ICD-10-CM | POA: Diagnosis not present

## 2021-05-21 DIAGNOSIS — Z87891 Personal history of nicotine dependence: Secondary | ICD-10-CM | POA: Diagnosis not present

## 2021-05-21 DIAGNOSIS — Z933 Colostomy status: Secondary | ICD-10-CM | POA: Diagnosis not present

## 2021-05-21 DIAGNOSIS — C20 Malignant neoplasm of rectum: Secondary | ICD-10-CM | POA: Diagnosis not present

## 2021-05-21 DIAGNOSIS — Z90711 Acquired absence of uterus with remaining cervical stump: Secondary | ICD-10-CM | POA: Diagnosis not present

## 2021-05-21 DIAGNOSIS — Z8541 Personal history of malignant neoplasm of cervix uteri: Secondary | ICD-10-CM | POA: Diagnosis not present

## 2021-05-21 DIAGNOSIS — Z79899 Other long term (current) drug therapy: Secondary | ICD-10-CM | POA: Diagnosis not present

## 2021-05-21 DIAGNOSIS — Z923 Personal history of irradiation: Secondary | ICD-10-CM | POA: Diagnosis not present

## 2021-05-21 DIAGNOSIS — Z9049 Acquired absence of other specified parts of digestive tract: Secondary | ICD-10-CM | POA: Diagnosis not present

## 2021-05-22 DIAGNOSIS — I7090 Unspecified atherosclerosis: Secondary | ICD-10-CM | POA: Diagnosis not present

## 2021-05-22 DIAGNOSIS — I10 Essential (primary) hypertension: Secondary | ICD-10-CM | POA: Diagnosis not present

## 2021-05-22 DIAGNOSIS — C20 Malignant neoplasm of rectum: Secondary | ICD-10-CM | POA: Diagnosis not present

## 2021-05-22 DIAGNOSIS — Z483 Aftercare following surgery for neoplasm: Secondary | ICD-10-CM | POA: Diagnosis not present

## 2021-05-22 DIAGNOSIS — I2510 Atherosclerotic heart disease of native coronary artery without angina pectoris: Secondary | ICD-10-CM | POA: Diagnosis not present

## 2021-05-22 DIAGNOSIS — E039 Hypothyroidism, unspecified: Secondary | ICD-10-CM | POA: Diagnosis not present

## 2021-05-22 DIAGNOSIS — I69351 Hemiplegia and hemiparesis following cerebral infarction affecting right dominant side: Secondary | ICD-10-CM | POA: Diagnosis not present

## 2021-05-22 DIAGNOSIS — Z433 Encounter for attention to colostomy: Secondary | ICD-10-CM | POA: Diagnosis not present

## 2021-05-22 DIAGNOSIS — N3281 Overactive bladder: Secondary | ICD-10-CM | POA: Diagnosis not present

## 2021-05-22 DIAGNOSIS — E119 Type 2 diabetes mellitus without complications: Secondary | ICD-10-CM | POA: Diagnosis not present

## 2021-05-28 DIAGNOSIS — C539 Malignant neoplasm of cervix uteri, unspecified: Secondary | ICD-10-CM | POA: Diagnosis not present

## 2021-05-28 DIAGNOSIS — C20 Malignant neoplasm of rectum: Secondary | ICD-10-CM | POA: Diagnosis not present

## 2021-05-28 DIAGNOSIS — I2510 Atherosclerotic heart disease of native coronary artery without angina pectoris: Secondary | ICD-10-CM | POA: Diagnosis not present

## 2021-05-28 DIAGNOSIS — E119 Type 2 diabetes mellitus without complications: Secondary | ICD-10-CM | POA: Diagnosis not present

## 2021-05-28 DIAGNOSIS — C538 Malignant neoplasm of overlapping sites of cervix uteri: Secondary | ICD-10-CM | POA: Diagnosis not present

## 2021-05-28 DIAGNOSIS — D630 Anemia in neoplastic disease: Secondary | ICD-10-CM | POA: Diagnosis not present

## 2021-05-28 DIAGNOSIS — Z433 Encounter for attention to colostomy: Secondary | ICD-10-CM | POA: Diagnosis not present

## 2021-05-28 DIAGNOSIS — I7090 Unspecified atherosclerosis: Secondary | ICD-10-CM | POA: Diagnosis not present

## 2021-05-28 DIAGNOSIS — I10 Essential (primary) hypertension: Secondary | ICD-10-CM | POA: Diagnosis not present

## 2021-05-28 DIAGNOSIS — I69351 Hemiplegia and hemiparesis following cerebral infarction affecting right dominant side: Secondary | ICD-10-CM | POA: Diagnosis not present

## 2021-05-28 DIAGNOSIS — E039 Hypothyroidism, unspecified: Secondary | ICD-10-CM | POA: Diagnosis not present

## 2021-06-04 DIAGNOSIS — C538 Malignant neoplasm of overlapping sites of cervix uteri: Secondary | ICD-10-CM | POA: Diagnosis not present

## 2021-06-04 DIAGNOSIS — I2510 Atherosclerotic heart disease of native coronary artery without angina pectoris: Secondary | ICD-10-CM | POA: Diagnosis not present

## 2021-06-04 DIAGNOSIS — C20 Malignant neoplasm of rectum: Secondary | ICD-10-CM | POA: Diagnosis not present

## 2021-06-04 DIAGNOSIS — E119 Type 2 diabetes mellitus without complications: Secondary | ICD-10-CM | POA: Diagnosis not present

## 2021-06-04 DIAGNOSIS — I7090 Unspecified atherosclerosis: Secondary | ICD-10-CM | POA: Diagnosis not present

## 2021-06-04 DIAGNOSIS — D630 Anemia in neoplastic disease: Secondary | ICD-10-CM | POA: Diagnosis not present

## 2021-06-04 DIAGNOSIS — I10 Essential (primary) hypertension: Secondary | ICD-10-CM | POA: Diagnosis not present

## 2021-06-04 DIAGNOSIS — E039 Hypothyroidism, unspecified: Secondary | ICD-10-CM | POA: Diagnosis not present

## 2021-06-04 DIAGNOSIS — Z433 Encounter for attention to colostomy: Secondary | ICD-10-CM | POA: Diagnosis not present

## 2021-06-04 DIAGNOSIS — I69351 Hemiplegia and hemiparesis following cerebral infarction affecting right dominant side: Secondary | ICD-10-CM | POA: Diagnosis not present

## 2021-06-05 DIAGNOSIS — C20 Malignant neoplasm of rectum: Secondary | ICD-10-CM | POA: Diagnosis not present

## 2021-06-05 DIAGNOSIS — M791 Myalgia, unspecified site: Secondary | ICD-10-CM | POA: Diagnosis not present

## 2021-06-05 DIAGNOSIS — E089 Diabetes mellitus due to underlying condition without complications: Secondary | ICD-10-CM | POA: Diagnosis not present

## 2021-06-05 DIAGNOSIS — M5417 Radiculopathy, lumbosacral region: Secondary | ICD-10-CM | POA: Diagnosis not present

## 2021-06-05 DIAGNOSIS — R69 Illness, unspecified: Secondary | ICD-10-CM | POA: Diagnosis not present

## 2021-06-05 DIAGNOSIS — G894 Chronic pain syndrome: Secondary | ICD-10-CM | POA: Diagnosis not present

## 2021-06-05 DIAGNOSIS — M545 Low back pain, unspecified: Secondary | ICD-10-CM | POA: Diagnosis not present

## 2021-06-05 DIAGNOSIS — M5440 Lumbago with sciatica, unspecified side: Secondary | ICD-10-CM | POA: Diagnosis not present

## 2021-06-07 ENCOUNTER — Telehealth: Payer: Self-pay | Admitting: Nurse Practitioner

## 2021-06-07 NOTE — Telephone Encounter (Signed)
Scheduled appt on 7/27 @815  am - pt daughter is aware of appt date and time.  Says she is aware of new location.

## 2021-06-10 ENCOUNTER — Ambulatory Visit: Payer: Medicare HMO | Admitting: Adult Health

## 2021-06-11 ENCOUNTER — Ambulatory Visit: Payer: Medicare HMO | Admitting: Cardiovascular Disease

## 2021-06-11 ENCOUNTER — Telehealth: Payer: Self-pay | Admitting: *Deleted

## 2021-06-11 ENCOUNTER — Ambulatory Visit: Payer: Medicare HMO | Admitting: Adult Health

## 2021-06-11 NOTE — Telephone Encounter (Signed)
Confirmed appointment for 06/12/21 at [facility] w/daughter, Joelene Millin. Arrival at 0800. Provided office address, parking, visitor and mask policy. Instructed to bring photo ID and insurance cards to appointment as well.

## 2021-06-12 ENCOUNTER — Other Ambulatory Visit: Payer: Self-pay

## 2021-06-12 ENCOUNTER — Inpatient Hospital Stay: Payer: Medicare HMO

## 2021-06-12 ENCOUNTER — Inpatient Hospital Stay: Payer: Medicare HMO | Attending: Nurse Practitioner | Admitting: Nurse Practitioner

## 2021-06-12 ENCOUNTER — Inpatient Hospital Stay: Payer: Medicare HMO | Admitting: Nurse Practitioner

## 2021-06-12 ENCOUNTER — Ambulatory Visit (HOSPITAL_COMMUNITY)
Admission: RE | Admit: 2021-06-12 | Discharge: 2021-06-12 | Disposition: A | Discharge status: Home / Self Care | Payer: Medicare HMO | Source: Ambulatory Visit | Attending: Nurse Practitioner | Admitting: Nurse Practitioner

## 2021-06-12 ENCOUNTER — Encounter: Payer: Self-pay | Admitting: Nurse Practitioner

## 2021-06-12 VITALS — BP 159/60 | HR 68 | Temp 97.8°F | Resp 20 | Ht 60.0 in | Wt 147.0 lb

## 2021-06-12 DIAGNOSIS — Z8673 Personal history of transient ischemic attack (TIA), and cerebral infarction without residual deficits: Secondary | ICD-10-CM | POA: Diagnosis not present

## 2021-06-12 DIAGNOSIS — C20 Malignant neoplasm of rectum: Secondary | ICD-10-CM

## 2021-06-12 DIAGNOSIS — G8929 Other chronic pain: Secondary | ICD-10-CM | POA: Diagnosis not present

## 2021-06-12 DIAGNOSIS — C539 Malignant neoplasm of cervix uteri, unspecified: Secondary | ICD-10-CM | POA: Insufficient documentation

## 2021-06-12 DIAGNOSIS — N1330 Unspecified hydronephrosis: Secondary | ICD-10-CM | POA: Diagnosis not present

## 2021-06-12 DIAGNOSIS — Z87440 Personal history of urinary (tract) infections: Secondary | ICD-10-CM | POA: Insufficient documentation

## 2021-06-12 LAB — CEA (ACCESS): CEA (CHCC): 31.01 ng/mL — ABNORMAL HIGH (ref 0.00–5.00)

## 2021-06-12 LAB — CBC WITH DIFFERENTIAL (CANCER CENTER ONLY)
Abs Immature Granulocytes: 0.01 10*3/uL (ref 0.00–0.07)
Basophils Absolute: 0 10*3/uL (ref 0.0–0.1)
Basophils Relative: 1 %
Eosinophils Absolute: 0.1 10*3/uL (ref 0.0–0.5)
Eosinophils Relative: 2 %
HCT: 35.6 % — ABNORMAL LOW (ref 36.0–46.0)
Hemoglobin: 10.4 g/dL — ABNORMAL LOW (ref 12.0–15.0)
Immature Granulocytes: 0 %
Lymphocytes Relative: 22 %
Lymphs Abs: 1.3 10*3/uL (ref 0.7–4.0)
MCH: 24.6 pg — ABNORMAL LOW (ref 26.0–34.0)
MCHC: 29.2 g/dL — ABNORMAL LOW (ref 30.0–36.0)
MCV: 84.4 fL (ref 80.0–100.0)
Monocytes Absolute: 0.5 10*3/uL (ref 0.1–1.0)
Monocytes Relative: 8 %
Neutro Abs: 4 10*3/uL (ref 1.7–7.7)
Neutrophils Relative %: 67 %
Platelet Count: 213 10*3/uL (ref 150–400)
RBC: 4.22 MIL/uL (ref 3.87–5.11)
RDW: 15 % (ref 11.5–15.5)
WBC Count: 6 10*3/uL (ref 4.0–10.5)
nRBC: 0 % (ref 0.0–0.2)

## 2021-06-12 LAB — CEA (IN HOUSE-CHCC): CEA (CHCC-In House): 28.6 ng/mL — ABNORMAL HIGH (ref 0.00–5.00)

## 2021-06-12 LAB — CMP (CANCER CENTER ONLY)
ALT: 6 U/L (ref 0–44)
AST: 14 U/L — ABNORMAL LOW (ref 15–41)
Albumin: 4 g/dL (ref 3.5–5.0)
Alkaline Phosphatase: 156 U/L — ABNORMAL HIGH (ref 38–126)
Anion gap: 15 (ref 5–15)
BUN: 10 mg/dL (ref 8–23)
CO2: 22 mmol/L (ref 22–32)
Calcium: 8.4 mg/dL — ABNORMAL LOW (ref 8.9–10.3)
Chloride: 105 mmol/L (ref 98–111)
Creatinine: 0.78 mg/dL (ref 0.44–1.00)
GFR, Estimated: >60 mL/min (ref >60)
Glucose, Bld: 159 mg/dL — ABNORMAL HIGH (ref 70–99)
Potassium: 3.8 mmol/L (ref 3.5–5.1)
Sodium: 142 mmol/L (ref 135–145)
Total Bilirubin: 0.3 mg/dL (ref 0.3–1.2)
Total Protein: 7.1 g/dL (ref 6.5–8.1)

## 2021-06-12 NOTE — Progress Notes (Signed)
[place] OFFICE PROGRESS NOTE   Diagnosis: Rectal cancer, cervical cancer  INTERVAL HISTORY:   [name] is a 75 year old woman initially seen by Dr. Benay Spice on 01/29/2021.  Oncology care subsequently transferred to [facility].  She feels she is overall recovering well from surgery.  Notes improvement in managing the colostomy.  She estimates emptying the bag 3 times a day.  Stools are typically formed.  She denies rectal pain.  No significant bleeding.  She describes her appetite as "okay".  She reports baseline numbness/tingling in the feet.  This affects balance.  She has pain related to neuropathy.  She is followed by a pain clinic.  She notes improved pain control since beginning asked Xtampza ER.  Objective:  Vital signs in last 24 hours:  Blood pressure (!) 159/60, pulse 68, temperature 97.8 F (36.6 C), temperature source Oral, resp. rate 20, height 5' (1.524 m), weight 147 lb (66.7 kg), SpO2 100 %.    HEENT: No thrush or ulcers. Lymphatics: No palpable cervical, supraclavicular, axillary or inguinal lymph nodes. Resp: Lungs clear bilaterally. Cardio: Regular rate and rhythm. GI: Abdomen soft.  Tender at the left abdomen.  Left lower quadrant colostomy.  No hepatomegaly. Vascular: Trace edema at the lower leg bilaterally, left greater than right.  Faint erythema medial bilateral lower leg. Neuro: Vibratory sense minimally decreased over the fingertips per tuning fork exam. Skin: Palms without erythema. Port-A-Cath without erythema.  Resolving surrounding ecchymosis.   Lab Results:  Lab Results  Component Value Date   WBC 4.7 01/22/2021   HGB 10.9 (L) 01/22/2021   HCT 35.5 (L) 01/22/2021   MCV 89.0 01/22/2021   PLT 264 01/22/2021   NEUTROABS 7.9 (H) 01/18/2021    Imaging:  No results found.  Medications: I have reviewed the patient's current medications.  Assessment/Plan: Rectal cancer Partially obstructing mass beginning at 10 cm from the anal  verge on colonoscopy 01/23/2021, biopsy-adenocarcinoma, immunohistochemical stains at [facility]20, CDX-2, and SATB2 positive, patchy strong staining for p16.  The rectal tumor has a distinct immunohistochemical profile suggesting a primary colorectal tumor as opposed to metastatic cervical cancer. CT abdomen/pelvis 01/18/2021-bilateral hydronephrosis, endometrial fluid collection, prominent stool in the colon MRI pelvis 01/23/2021-tumor at 7.6 cm from the anal verge, abnormal signal bridge in the cervix, mesorectum, and anterior aspect of the rectum-similar to MRI from 2020, MRI stage could not be defined secondary to posttreatment changes in the pelvis 03/18/2021-total abdominal hysterectomy, cystoscopy with insertion of ureteral stents, exploratory laparotomy, aborted low anterior resection, descending loop colostomy--right and left fallopian tubes and ovaries negative for malignancy; excision portion of cervix positive for adenocarcinoma; uterus with invasive moderately differentiated adenocarcinoma of the cervix, HPV associated, invading through full-thickness of the cervix/lower uterine segment and into parametrial tissue, margins of resection received disrupted, cannot be evaluated; remaining uterus with extensive endocervical adenocarcinoma in situ involving lower uterine segment and replacing endometrium.  This carcinoma was felt to be a separate primary from the rectal tumor. PET scan at [facility] 04/11/2021-intense FDG activity in region of known rectal mass.  Hypermetabolic left external iliac and right inguinal lymph nodes.  Minimal FDG uptake and small right supraclavicular lymph node.  No uptake seen in the cervix. 04/23/2021 FNA right inguinal lymph node-negative for malignancy Evaluated by Dr. Leamon Arnt 05/21/2021, 05/28/2021-appears to have 2 synchronous malignancies (cervical adenocarcinoma and rectal adenocarcinoma); further surgery which would entail pelvic exenteration not recommended by gynecologic oncology or  colorectal surgery.  Full doses of radiotherapy not felt to be feasible.  Recommended 3 months of CAPOX and then repeat MRI of abdomen/pelvis, chest CT and colonoscopy. Cycle 1 Xeloda 06/13/2021    Bilateral hydronephrosis-secondary to urinary retention? Hematometra found on pelvic MRI 01/23/2021, evaluated by gynecologic oncology Cervical cancer November 1994, stage Ib, treated with external beam radiation and brachytherapy at [facility] Diabetes Neuropathy Chronic pain secondary to #6 Recurrent urinary tract infections History of a CVA History of hyperthyroidism G3, P3 COVID-19 infection January 2021  Disposition: [name] has been diagnosed with rectal cancer and cervical cancer.  She is followed by Dr. Leamon Arnt at [facility] with the recommendation for 3 months of CAPOX.  Plan for the first cycle with Xeloda alone.  We reviewed potential toxicities associated with Xeloda including bone marrow toxicity, nausea, hair loss, mouth sores, diarrhea, hand-foot syndrome, increased sensitivity to sun, skin hyperpigmentation, skin rash.  She agrees to proceed.  She will begin cycle 1 on 06/13/2021.  Consider adding oxaliplatin with cycle 2.  The main concern with oxaliplatin is baseline painful neuropathy symptoms involving the feet.  She understands current symptoms could worsen.  Plan for additional discussion at next office visit.  She has asymmetric leg edema.  We are referring her for venous Doppler study to rule out DVT.  We are obtaining baseline labs today to include CBC, chemistry panel, CEA.  She will return for lab and follow-up on 07/01/2021.  We are available to see her sooner if needed.  Patient seen with Dr. Benay Spice.    Ned Card ANP/GNP-BC   06/12/2021  9:27 AM   This was a shared visit with Ned Card.  [name] was interviewed and examined.  Records from [facility] were reviewed.  She has been diagnosed with recurrence of cervical adenocarcinoma after undergoing a supra cervical  hysterectomy.  There appears to be gross cervical tumor remaining.  She has a separate adenocarcinoma of the rectum, early clinical stage.  A pelvic exoneration will be required to resect the remaining cervical cancer and rectal tumor.  Exoneration is not recommended by the surgeons at [facility].  Dr. Leamon Arnt recommends a course of CAPOX to be followed by restaging.  She may then be a candidate for resection of the rectal tumor.  We discussed treatment options with [name] and her daughter.  She understands the chance of worsened neuropathy of oxaliplatin.  She has significant lower extremity neuropathy causing pain.  She is followed in a pain clinic for this.  We decided to begin with single agent capecitabine.  We reviewed potential toxicities associated with capecitabine.  She will begin cycle 1 capecitabine on 06/13/2021.  We will consider adding dose adjusted oxaliplatin with cycle 2 or cycle 3.  Ms. Bracewell and her daughter understand CAPOX chemotherapy will not be curative for either the rectal or cervical tumors.   I was present for greater than 50% of today's visit.  I performed medical decision making.  Julieanne Manson, MD

## 2021-06-12 NOTE — Progress Notes (Signed)
Bilateral lower extremity venous duplex has been completed. Preliminary results can be found in CV Proc through chart review.  Results were given to Manuela Schwartz at Ned Card' NP office.  06/12/21 11:15 AM Carlos Levering RVT

## 2021-06-13 ENCOUNTER — Encounter: Payer: Self-pay | Admitting: Nurse Practitioner

## 2021-06-17 DIAGNOSIS — E119 Type 2 diabetes mellitus without complications: Secondary | ICD-10-CM | POA: Diagnosis not present

## 2021-06-17 DIAGNOSIS — E039 Hypothyroidism, unspecified: Secondary | ICD-10-CM | POA: Diagnosis not present

## 2021-06-17 DIAGNOSIS — C538 Malignant neoplasm of overlapping sites of cervix uteri: Secondary | ICD-10-CM | POA: Diagnosis not present

## 2021-06-17 DIAGNOSIS — I2510 Atherosclerotic heart disease of native coronary artery without angina pectoris: Secondary | ICD-10-CM | POA: Diagnosis not present

## 2021-06-17 DIAGNOSIS — I10 Essential (primary) hypertension: Secondary | ICD-10-CM | POA: Diagnosis not present

## 2021-06-17 DIAGNOSIS — I7090 Unspecified atherosclerosis: Secondary | ICD-10-CM | POA: Diagnosis not present

## 2021-06-17 DIAGNOSIS — C20 Malignant neoplasm of rectum: Secondary | ICD-10-CM | POA: Diagnosis not present

## 2021-06-17 DIAGNOSIS — D630 Anemia in neoplastic disease: Secondary | ICD-10-CM | POA: Diagnosis not present

## 2021-06-17 DIAGNOSIS — Z433 Encounter for attention to colostomy: Secondary | ICD-10-CM | POA: Diagnosis not present

## 2021-06-17 DIAGNOSIS — I69351 Hemiplegia and hemiparesis following cerebral infarction affecting right dominant side: Secondary | ICD-10-CM | POA: Diagnosis not present

## 2021-06-20 DIAGNOSIS — Z433 Encounter for attention to colostomy: Secondary | ICD-10-CM | POA: Diagnosis not present

## 2021-06-26 DIAGNOSIS — Z933 Colostomy status: Secondary | ICD-10-CM | POA: Diagnosis not present

## 2021-06-27 DIAGNOSIS — I69351 Hemiplegia and hemiparesis following cerebral infarction affecting right dominant side: Secondary | ICD-10-CM | POA: Diagnosis not present

## 2021-06-27 DIAGNOSIS — Z433 Encounter for attention to colostomy: Secondary | ICD-10-CM | POA: Diagnosis not present

## 2021-06-27 DIAGNOSIS — D630 Anemia in neoplastic disease: Secondary | ICD-10-CM | POA: Diagnosis not present

## 2021-06-27 DIAGNOSIS — I7090 Unspecified atherosclerosis: Secondary | ICD-10-CM | POA: Diagnosis not present

## 2021-06-27 DIAGNOSIS — C20 Malignant neoplasm of rectum: Secondary | ICD-10-CM | POA: Diagnosis not present

## 2021-06-27 DIAGNOSIS — E039 Hypothyroidism, unspecified: Secondary | ICD-10-CM | POA: Diagnosis not present

## 2021-06-27 DIAGNOSIS — I2510 Atherosclerotic heart disease of native coronary artery without angina pectoris: Secondary | ICD-10-CM | POA: Diagnosis not present

## 2021-06-27 DIAGNOSIS — E119 Type 2 diabetes mellitus without complications: Secondary | ICD-10-CM | POA: Diagnosis not present

## 2021-06-27 DIAGNOSIS — C538 Malignant neoplasm of overlapping sites of cervix uteri: Secondary | ICD-10-CM | POA: Diagnosis not present

## 2021-06-27 DIAGNOSIS — I10 Essential (primary) hypertension: Secondary | ICD-10-CM | POA: Diagnosis not present

## 2021-06-29 DIAGNOSIS — Z433 Encounter for attention to colostomy: Secondary | ICD-10-CM | POA: Diagnosis not present

## 2021-07-01 ENCOUNTER — Inpatient Hospital Stay: Payer: Medicare HMO | Attending: Nurse Practitioner

## 2021-07-01 ENCOUNTER — Inpatient Hospital Stay: Payer: Medicare HMO | Admitting: Nurse Practitioner

## 2021-07-01 ENCOUNTER — Encounter: Payer: Self-pay | Admitting: Nurse Practitioner

## 2021-07-01 ENCOUNTER — Other Ambulatory Visit: Payer: Self-pay

## 2021-07-01 ENCOUNTER — Inpatient Hospital Stay: Payer: Medicare HMO

## 2021-07-01 VITALS — BP 159/56 | HR 83 | Temp 97.8°F | Resp 20 | Ht 60.0 in | Wt 146.6 lb

## 2021-07-01 DIAGNOSIS — D6481 Anemia due to antineoplastic chemotherapy: Secondary | ICD-10-CM | POA: Insufficient documentation

## 2021-07-01 DIAGNOSIS — Z5111 Encounter for antineoplastic chemotherapy: Secondary | ICD-10-CM | POA: Diagnosis not present

## 2021-07-01 DIAGNOSIS — C20 Malignant neoplasm of rectum: Secondary | ICD-10-CM

## 2021-07-01 DIAGNOSIS — D6959 Other secondary thrombocytopenia: Secondary | ICD-10-CM | POA: Diagnosis not present

## 2021-07-01 DIAGNOSIS — C539 Malignant neoplasm of cervix uteri, unspecified: Secondary | ICD-10-CM | POA: Diagnosis not present

## 2021-07-01 DIAGNOSIS — Z7189 Other specified counseling: Secondary | ICD-10-CM | POA: Diagnosis not present

## 2021-07-01 DIAGNOSIS — Z95828 Presence of other vascular implants and grafts: Secondary | ICD-10-CM

## 2021-07-01 LAB — CMP (CANCER CENTER ONLY)
ALT: 7 U/L (ref 0–44)
AST: 14 U/L — ABNORMAL LOW (ref 15–41)
Albumin: 3.7 g/dL (ref 3.5–5.0)
Alkaline Phosphatase: 112 U/L (ref 38–126)
Anion gap: 12 (ref 5–15)
BUN: 21 mg/dL (ref 8–23)
CO2: 24 mmol/L (ref 22–32)
Calcium: 8.6 mg/dL — ABNORMAL LOW (ref 8.9–10.3)
Chloride: 103 mmol/L (ref 98–111)
Creatinine: 0.81 mg/dL (ref 0.44–1.00)
GFR, Estimated: >60 mL/min (ref >60)
Glucose, Bld: 188 mg/dL — ABNORMAL HIGH (ref 70–99)
Potassium: 3.9 mmol/L (ref 3.5–5.1)
Sodium: 139 mmol/L (ref 135–145)
Total Bilirubin: 0.4 mg/dL (ref 0.3–1.2)
Total Protein: 6.6 g/dL (ref 6.5–8.1)

## 2021-07-01 LAB — CBC WITH DIFFERENTIAL (CANCER CENTER ONLY)
Abs Immature Granulocytes: 0.02 10*3/uL (ref 0.00–0.07)
Basophils Absolute: 0 10*3/uL (ref 0.0–0.1)
Basophils Relative: 0 %
Eosinophils Absolute: 0.1 10*3/uL (ref 0.0–0.5)
Eosinophils Relative: 1 %
HCT: 29.2 % — ABNORMAL LOW (ref 36.0–46.0)
Hemoglobin: 8.9 g/dL — ABNORMAL LOW (ref 12.0–15.0)
Immature Granulocytes: 0 %
Lymphocytes Relative: 20 %
Lymphs Abs: 1.2 10*3/uL (ref 0.7–4.0)
MCH: 25.2 pg — ABNORMAL LOW (ref 26.0–34.0)
MCHC: 30.5 g/dL (ref 30.0–36.0)
MCV: 82.7 fL (ref 80.0–100.0)
Monocytes Absolute: 0.4 10*3/uL (ref 0.1–1.0)
Monocytes Relative: 7 %
Neutro Abs: 4.2 10*3/uL (ref 1.7–7.7)
Neutrophils Relative %: 72 %
Platelet Count: 150 10*3/uL (ref 150–400)
RBC: 3.53 MIL/uL — ABNORMAL LOW (ref 3.87–5.11)
RDW: 16.3 % — ABNORMAL HIGH (ref 11.5–15.5)
WBC Count: 5.9 10*3/uL (ref 4.0–10.5)
nRBC: 0 % (ref 0.0–0.2)

## 2021-07-01 MED ORDER — SODIUM CHLORIDE 0.9% FLUSH
10.0000 mL | Freq: Once | INTRAVENOUS | Status: AC
  Administered 2021-07-01: 10 mL via INTRAVENOUS

## 2021-07-01 MED ORDER — HEPARIN SOD (PORK) LOCK FLUSH 100 UNIT/ML IV SOLN
500.0000 [IU] | Freq: Once | INTRAVENOUS | Status: AC
  Administered 2021-07-01: 500 [IU] via INTRAVENOUS

## 2021-07-01 NOTE — Progress Notes (Signed)
[place] OFFICE PROGRESS NOTE   Diagnosis: Rectal cancer, cervical cancer  INTERVAL HISTORY:   Ms. Edgley returns as scheduled.  She completed cycle 1 Xeloda beginning 06/13/2021.  She denies nausea/vomiting.  No mouth sores.  No diarrhea.  No hand or foot pain or redness.  She has intermittent rectal bleeding.  Objective:  Vital signs in last 24 hours:  Blood pressure (!) 159/56, pulse 83, temperature 97.8 F (36.6 C), temperature source Oral, resp. rate 20, height 5' (1.524 m), weight 146 lb 9.6 oz (66.5 kg), SpO2 97 %.    HEENT: No thrush or ulcers. Resp: Lungs clear bilaterally. Cardio: Regular rate and rhythm. GI: Abdomen soft and nontender.  Left lower quadrant colostomy.  No hepatomegaly. Vascular: Trace edema lower leg bilaterally, left greater than right.  Faint erythema medial bilateral lower leg. Skin: Palms with mild erythema. Port-A-Cath without erythema.   Lab Results:  Lab Results  Component Value Date   WBC 5.9 07/01/2021   HGB 8.9 (L) 07/01/2021   HCT 29.2 (L) 07/01/2021   MCV 82.7 07/01/2021   PLT 150 07/01/2021   NEUTROABS 4.2 07/01/2021    Imaging:  No results found.  Medications: I have reviewed the patient's current medications.  Assessment/Plan: Rectal cancer Partially obstructing mass beginning at 10 cm from the anal verge on colonoscopy 01/23/2021, biopsy-adenocarcinoma, immunohistochemical stains at [facility], CDX-2, and SATB2 positive, patchy strong staining for p16.  The rectal tumor has a distinct immunohistochemical profile suggesting a primary colorectal tumor as opposed to metastatic cervical cancer. CT abdomen/pelvis 01/18/2021-bilateral hydronephrosis, endometrial fluid collection, prominent stool in the colon MRI pelvis 01/23/2021-tumor at 7.6 cm from the anal verge, abnormal signal bridge in the cervix, mesorectum, and anterior aspect of the rectum-similar to MRI from 2020, MRI stage could not be defined secondary to  posttreatment changes in the pelvis 03/18/2021-total abdominal hysterectomy, cystoscopy with insertion of ureteral stents, exploratory laparotomy, aborted low anterior resection, descending loop colostomy--right and left fallopian tubes and ovaries negative for malignancy; excision portion of cervix positive for adenocarcinoma; uterus with invasive moderately differentiated adenocarcinoma of the cervix, HPV associated, invading through full-thickness of the cervix/lower uterine segment and into parametrial tissue, margins of resection received disrupted, cannot be evaluated; remaining uterus with extensive endocervical adenocarcinoma in situ involving lower uterine segment and replacing endometrium.  This carcinoma was felt to be a separate primary from the rectal tumor. PET scan at [facility] 04/11/2021-intense FDG activity in region of known rectal mass.  Hypermetabolic left external iliac and right inguinal lymph nodes.  Minimal FDG uptake and small right supraclavicular lymph node.  No uptake seen in the cervix. 04/23/2021 FNA right inguinal lymph node-negative for malignancy Evaluated by Dr. Leamon Arnt 05/21/2021, 05/28/2021-appears to have 2 synchronous malignancies (cervical adenocarcinoma and rectal adenocarcinoma); further surgery which would entail pelvic exenteration not recommended by gynecologic oncology or colorectal surgery.  Full doses of radiotherapy not felt to be feasible.  Recommended 3 months of CAPOX and then repeat MRI of abdomen/pelvis, chest CT and colonoscopy. Cycle 1 Xeloda 06/13/2021     Bilateral hydronephrosis-secondary to urinary retention? Hematometra found on pelvic MRI 01/23/2021, evaluated by gynecologic oncology Cervical cancer November 1994, stage Ib, treated with external beam radiation and brachytherapy at [facility] Diabetes Neuropathy Chronic pain secondary to #6 Recurrent urinary tract infections History of a CVA History of hyperthyroidism G3, P3 COVID-19 infection January  2021  Disposition: Ms. Simer appears stable.  She is accompanied by her daughter.  She completed cycle 1 Xeloda beginning  on 06/13/2021.  Overall she seems to have tolerated well.  Dr. Benay Spice discussed adding oxaliplatin with cycle 2.  We discussed the CAPOX regimen versus the FOLFOX regimen.  We reviewed potential toxicities associated with oxaliplatin including bone marrow toxicity, nausea, hair loss, neuropathy including cold sensitivity, peripheral neuropathy and more rare occurrences such as diplopia.  She understands her pre-existing neuropathy symptoms could worsen.  She agrees to proceed.  Dr. Benay Spice recommends the FOLFOX regimen.  Ms. Memon agrees with this plan.  She will return for lab, follow-up, cycle 1 FOLFOX in 1 week.  Patient seen with Dr. Benay Spice.    Ned Card ANP/GNP-BC   07/01/2021  11:47 AM This was a shared visit with Ned Card.  Ms. Courts tolerated the first cycle of capecitabine well.  We discussed the expected clinical response rate from single agent capecitabine and with the addition of oxaliplatin.  She understands potential toxicities associated with oxaliplatin including the chance of worsening neuropathy.  She would like to proceed with a trial of oxaliplatin based therapy.  I recommend FOLFOX due to predicted better tolerance and a lower oxaliplatin dose.  The plan is to begin FOLFOX on 07/08/2021.  A chemotherapy plan was entered today.  I was present for greater than 50% of today's visit.  I performed medical decision making.  Julieanne Manson, MD

## 2021-07-01 NOTE — Progress Notes (Signed)
START ON PATHWAY REGIMEN - Colorectal     A cycle is every 14 days:     Oxaliplatin      Leucovorin      Fluorouracil      Fluorouracil   **Always confirm dose/schedule in your pharmacy ordering system**  Patient Characteristics: Preoperative or Nonsurgical Candidate (Clinical Staging), Rectal, cT3 - cT4, cN0 or Any cT, cN+ Tumor Location: Rectal Therapeutic Status: Preoperative or Nonsurgical Candidate (Clinical Staging) AJCC T Category: Staged < 8th Ed. AJCC N Category: Staged < 8th Ed. AJCC M Category: Staged < 8th Ed. AJCC 8 Stage Grouping: Staged < 8th Ed. Intent of Therapy: Non-Curative / Palliative Intent, Discussed with Patient

## 2021-07-01 NOTE — Patient Instructions (Signed)
Implanted [facility] Guide An implanted port is a device that is placed under the skin. It is usually placed in the chest. The device can be used to give IV medicine, to take blood, or for dialysis. You may have an implanted port if: You need IV medicine that would be irritating to the small veins in your hands or arms. You need IV medicines, such as antibiotics, for a long period of time. You need IV nutrition for a long period of time. You need dialysis. When you have a port, your health care provider can choose to use the port instead of veins in your arms for these procedures. You may have fewer limitations when using a port than you would if you used other types of long-term IVs, and you will likely be able to return to normal activities afteryour incision heals. An implanted port has two main parts: Reservoir. The reservoir is the part where a needle is inserted to give medicines or draw blood. The reservoir is round. After it is placed, it appears as a small, raised area under your skin. Catheter. The catheter is a thin, flexible tube that connects the reservoir to a vein. Medicine that is inserted into the reservoir goes into the catheter and then into the vein. How is my port accessed? To access your port: A numbing cream may be placed on the skin over the port site. Your health care provider will put on a mask and sterile gloves. The skin over your port will be cleaned carefully with a germ-killing soap and allowed to dry. Your health care provider will gently pinch the port and insert a needle into it. Your health care provider will check for a blood return to make sure the port is in the vein and is not clogged. If your port needs to remain accessed to get medicine continuously (constant infusion), your health care provider will place a clear bandage (dressing) over the needle site. The dressing and needle will need to be changed every week, or as told by your health care provider. What  is flushing? Flushing helps keep the port from getting clogged. Follow instructions from your health care provider about how and when to flush the port. Ports are usually flushed with saline solution or a medicine called heparin. The need for flushing will depend on how the port is used: If the port is only used from time to time to give medicines or draw blood, the port may need to be flushed: Before and after medicines have been given. Before and after blood has been drawn. As part of routine maintenance. Flushing may be recommended every 4-6 weeks. If a constant infusion is running, the port may not need to be flushed. Throw away any syringes in a disposal container that is meant for sharp items (sharps container). You can buy a sharps container from a pharmacy, or you can make one by using an empty hard plastic bottle with a cover. How long will my port stay implanted? The port can stay in for as long as your health care provider thinks it is needed. When it is time for the port to come out, a surgery will be done to remove it. The surgery will be similar to the procedure that was done to putthe port in. Follow these instructions at home:  Flush your port as told by your health care provider. If you need an infusion over several days, follow instructions from your health care provider about how to take  care of your port site. Make sure you: Wash your hands with soap and water before you change your dressing. If soap and water are not available, use alcohol-based hand sanitizer. Change your dressing as told by your health care provider. Place any used dressings or infusion bags into a plastic bag. Throw that bag in the trash. Keep the dressing that covers the needle clean and dry. Do not get it wet. Do not use scissors or sharp objects near the tube. Keep the tube clamped, unless it is being used. Check your port site every day for signs of infection. Check for: Redness, swelling, or  pain. Fluid or blood. Pus or a bad smell. Protect the skin around the port site. Avoid wearing bra straps that rub or irritate the site. Protect the skin around your port from seat belts. Place a soft pad over your chest if needed. Bathe or shower as told by your health care provider. The site may get wet as long as you are not actively receiving an infusion. Return to your normal activities as told by your health care provider. Ask your health care provider what activities are safe for you. Carry a medical alert card or wear a medical alert bracelet at all times. This will let health care providers know that you have an implanted port in case of an emergency. Get help right away if: You have redness, swelling, or pain at the port site. You have fluid or blood coming from your port site. You have pus or a bad smell coming from the port site. You have a fever. Summary Implanted ports are usually placed in the chest for long-term IV access. Follow instructions from your health care provider about flushing the port and changing bandages (dressings). Take care of the area around your port by avoiding clothing that puts pressure on the area, and by watching for signs of infection. Protect the skin around your port from seat belts. Place a soft pad over your chest if needed. Get help right away if you have a fever or you have redness, swelling, pain, drainage, or a bad smell at the port site. This information is not intended to replace advice given to you by your health care provider. Make sure you discuss any questions you have with your healthcare provider. Document Revised: 03/19/2020 Document Reviewed: 03/19/2020 Elsevier Patient Education  [place].

## 2021-07-02 ENCOUNTER — Ambulatory Visit: Payer: Medicare HMO | Admitting: Podiatry

## 2021-07-02 DIAGNOSIS — B351 Tinea unguium: Secondary | ICD-10-CM

## 2021-07-02 DIAGNOSIS — E1142 Type 2 diabetes mellitus with diabetic polyneuropathy: Secondary | ICD-10-CM

## 2021-07-02 DIAGNOSIS — E039 Hypothyroidism, unspecified: Secondary | ICD-10-CM | POA: Diagnosis not present

## 2021-07-02 DIAGNOSIS — E119 Type 2 diabetes mellitus without complications: Secondary | ICD-10-CM | POA: Diagnosis not present

## 2021-07-02 DIAGNOSIS — I2510 Atherosclerotic heart disease of native coronary artery without angina pectoris: Secondary | ICD-10-CM | POA: Diagnosis not present

## 2021-07-02 DIAGNOSIS — C20 Malignant neoplasm of rectum: Secondary | ICD-10-CM | POA: Diagnosis not present

## 2021-07-02 DIAGNOSIS — C538 Malignant neoplasm of overlapping sites of cervix uteri: Secondary | ICD-10-CM | POA: Diagnosis not present

## 2021-07-02 DIAGNOSIS — M79674 Pain in right toe(s): Secondary | ICD-10-CM | POA: Diagnosis not present

## 2021-07-02 DIAGNOSIS — I7090 Unspecified atherosclerosis: Secondary | ICD-10-CM | POA: Diagnosis not present

## 2021-07-02 DIAGNOSIS — I69351 Hemiplegia and hemiparesis following cerebral infarction affecting right dominant side: Secondary | ICD-10-CM | POA: Diagnosis not present

## 2021-07-02 DIAGNOSIS — I10 Essential (primary) hypertension: Secondary | ICD-10-CM | POA: Diagnosis not present

## 2021-07-02 DIAGNOSIS — L84 Corns and callosities: Secondary | ICD-10-CM

## 2021-07-02 DIAGNOSIS — D630 Anemia in neoplastic disease: Secondary | ICD-10-CM | POA: Diagnosis not present

## 2021-07-02 DIAGNOSIS — M79675 Pain in left toe(s): Secondary | ICD-10-CM

## 2021-07-02 DIAGNOSIS — Z433 Encounter for attention to colostomy: Secondary | ICD-10-CM | POA: Diagnosis not present

## 2021-07-03 ENCOUNTER — Telehealth: Payer: Self-pay

## 2021-07-03 ENCOUNTER — Other Ambulatory Visit: Payer: Self-pay

## 2021-07-03 NOTE — Telephone Encounter (Signed)
-----  Message from Owens Shark, NP sent at 07/03/2021  7:51 AM EDT ----- Please call pathology--request IHC for mismatch repair proteins on rectum biopsy 01/23/2021

## 2021-07-03 NOTE — Telephone Encounter (Signed)
Called completed request for MRR on rectal biopsy for 01/23/21

## 2021-07-05 ENCOUNTER — Inpatient Hospital Stay: Payer: Medicare HMO

## 2021-07-05 ENCOUNTER — Other Ambulatory Visit: Payer: Self-pay

## 2021-07-05 NOTE — Progress Notes (Signed)
Pharmacist Chemotherapy Monitoring - Initial Assessment    Anticipated start date: 07/08/21   The following has been reviewed per standard work regarding the patient's treatment regimen: The patient's diagnosis, treatment plan and drug doses, and organ/hematologic function Lab orders and baseline tests specific to treatment regimen  The treatment plan start date, drug sequencing, and pre-medications Prior authorization status  Patient's documented medication list, including drug-drug interaction screen and prescriptions for anti-emetics and supportive care specific to the treatment regimen The drug concentrations, fluid compatibility, administration routes, and timing of the medications to be used The patient's access for treatment and lifetime cumulative dose history, if applicable  The patient's medication allergies and previous infusion related reactions, if applicable   Changes made to treatment plan:  N/A  Follow up needed:  N/A   [name], [facility], 07/05/2021  4:23 PM

## 2021-07-06 ENCOUNTER — Encounter: Payer: Self-pay | Admitting: Podiatry

## 2021-07-06 NOTE — Progress Notes (Signed)
Subjective: [name] is a pleasant 75 y.o. female patient seen today for at risk foot care with h/o diabetic neuropathy. She is seen for porokeratotic lesion plantar left hallux and painful thick toenails that are difficult to trim. Pain interferes with ambulation. Aggravating factors include wearing enclosed shoe gear. Pain is relieved with periodic professional debridement.  She states her blood glucose was 146 mg/dl today. Her son, Jeanell Sparrow, is present during today's visit.  Per chart review, she is undergoing chemotherapy for rectal cancer.  PCP is Everardo Beals, NP. Last visit was: 18 weeks ago.  Allergies  Allergen Reactions   Codeine Nausea Only    Objective: Physical Exam  General: [name] is a pleasant 75 y.o. Caucasian female, WD, WN in NAD. AAO x 3.   Vascular:  Capillary fill time to digits <3 seconds b/l lower extremities. Palpable DP pulse(s) b/l lower extremities Palpable PT pulse(s) b/l lower extremities Pedal hair absent. Lower extremity skin temperature gradient within normal limits. No pain with calf compression b/l. Trace edema noted right lower extremity with no open wounds. No warmth, no frank cellulitis.  Dermatological:  Skin warm and supple b/l lower extremities. No open wounds b/l lower extremities. No interdigital macerations b/l lower extremities. Toenails 1-5 b/l elongated, discolored, dystrophic, thickened, crumbly with subungual debris and tenderness to dorsal palpation. Hyperkeratotic lesion(s) L hallux and R hallux.  No erythema, no edema, no drainage, no fluctuance.  Musculoskeletal:  Normal muscle strength 5/5 to all lower extremity muscle groups bilaterally. No pain crepitus or joint limitation noted with ROM b/l lower extremities. Hammertoe(s) noted to the L 2nd toe and R 2nd toe. Utilizes rollator for ambulation assistance.  Neurological:  Protective sensation diminished with 10g monofilament b/l. Vibratory sensation decreased  b/l.  Assessment and Plan:  1. Pain due to onychomycosis of toenails of both feet   2. Callus   3. Diabetic peripheral neuropathy associated with type 2 diabetes mellitus ([place])   -Examined patient. -Continue diabetic foot care principles: inspect feet daily, monitor glucose as recommended by PCP and/or Endocrinologist, and follow prescribed diet per PCP, Endocrinologist and/or dietician. -Patient to continue soft, supportive shoe gear daily. -Toenails 1-5 b/l were debrided in length and girth with sterile nail nippers and dremel without iatrogenic bleeding.  -Callus(es) L hallux and R hallux pared utilizing sterile scalpel blade without complication or incident. Total number debrided =2. -Patient to report any pedal injuries to medical professional immediately. -Patient/POA to call should there be question/concern in the interim.  Return in about 9 weeks (around 09/03/2021).  [name], DPM

## 2021-07-08 ENCOUNTER — Inpatient Hospital Stay: Payer: Medicare HMO

## 2021-07-08 ENCOUNTER — Other Ambulatory Visit: Payer: Self-pay

## 2021-07-08 ENCOUNTER — Other Ambulatory Visit: Payer: Self-pay | Admitting: Nurse Practitioner

## 2021-07-08 ENCOUNTER — Inpatient Hospital Stay (HOSPITAL_BASED_OUTPATIENT_CLINIC_OR_DEPARTMENT_OTHER): Payer: Medicare HMO | Admitting: Oncology

## 2021-07-08 VITALS — BP 150/48 | HR 93 | Temp 98.0°F | Resp 18 | Ht 60.0 in | Wt 149.2 lb

## 2021-07-08 DIAGNOSIS — Z5111 Encounter for antineoplastic chemotherapy: Secondary | ICD-10-CM | POA: Diagnosis not present

## 2021-07-08 DIAGNOSIS — D6959 Other secondary thrombocytopenia: Secondary | ICD-10-CM | POA: Diagnosis not present

## 2021-07-08 DIAGNOSIS — C20 Malignant neoplasm of rectum: Secondary | ICD-10-CM

## 2021-07-08 DIAGNOSIS — D6481 Anemia due to antineoplastic chemotherapy: Secondary | ICD-10-CM | POA: Diagnosis not present

## 2021-07-08 DIAGNOSIS — C539 Malignant neoplasm of cervix uteri, unspecified: Secondary | ICD-10-CM | POA: Diagnosis not present

## 2021-07-08 LAB — CMP (CANCER CENTER ONLY)
ALT: 6 U/L (ref 0–44)
AST: 12 U/L — ABNORMAL LOW (ref 15–41)
Albumin: 3.4 g/dL — ABNORMAL LOW (ref 3.5–5.0)
Alkaline Phosphatase: 114 U/L (ref 38–126)
Anion gap: 12 (ref 5–15)
BUN: 13 mg/dL (ref 8–23)
CO2: 24 mmol/L (ref 22–32)
Calcium: 8.2 mg/dL — ABNORMAL LOW (ref 8.9–10.3)
Chloride: 104 mmol/L (ref 98–111)
Creatinine: 0.91 mg/dL (ref 0.44–1.00)
GFR, Estimated: >60 mL/min (ref >60)
Glucose, Bld: 303 mg/dL — ABNORMAL HIGH (ref 70–99)
Potassium: 3.3 mmol/L — ABNORMAL LOW (ref 3.5–5.1)
Sodium: 140 mmol/L (ref 135–145)
Total Bilirubin: 0.3 mg/dL (ref 0.3–1.2)
Total Protein: 6 g/dL — ABNORMAL LOW (ref 6.5–8.1)

## 2021-07-08 LAB — CBC WITH DIFFERENTIAL (CANCER CENTER ONLY)
Abs Immature Granulocytes: 0.01 10*3/uL (ref 0.00–0.07)
Abs Immature Granulocytes: 0.02 10*3/uL (ref 0.00–0.07)
Basophils Absolute: 0 10*3/uL (ref 0.0–0.1)
Basophils Absolute: 0 10*3/uL (ref 0.0–0.1)
Basophils Relative: 0 %
Basophils Relative: 0 %
Eosinophils Absolute: 0.1 10*3/uL (ref 0.0–0.5)
Eosinophils Absolute: 0.1 10*3/uL (ref 0.0–0.5)
Eosinophils Relative: 2 %
Eosinophils Relative: 1 %
HCT: 16.3 % — ABNORMAL LOW (ref 36.0–46.0)
HCT: 27.4 % — ABNORMAL LOW (ref 36.0–46.0)
Hemoglobin: 4.8 g/dL — CL (ref 12.0–15.0)
Hemoglobin: 8.1 g/dL — ABNORMAL LOW (ref 12.0–15.0)
Immature Granulocytes: 0 %
Immature Granulocytes: 0 %
Lymphocytes Relative: 11 %
Lymphocytes Relative: 12 %
Lymphs Abs: 0.4 10*3/uL — ABNORMAL LOW (ref 0.7–4.0)
Lymphs Abs: 0.7 10*3/uL (ref 0.7–4.0)
MCH: 25.9 pg — ABNORMAL LOW (ref 26.0–34.0)
MCH: 25.6 pg — ABNORMAL LOW (ref 26.0–34.0)
MCHC: 29.4 g/dL — ABNORMAL LOW (ref 30.0–36.0)
MCHC: 29.6 g/dL — ABNORMAL LOW (ref 30.0–36.0)
MCV: 88.1 fL (ref 80.0–100.0)
MCV: 86.4 fL (ref 80.0–100.0)
Monocytes Absolute: 0.4 10*3/uL (ref 0.1–1.0)
Monocytes Absolute: 0.7 10*3/uL (ref 0.1–1.0)
Monocytes Relative: 10 %
Monocytes Relative: 11 %
Neutro Abs: 2.8 10*3/uL (ref 1.7–7.7)
Neutro Abs: 4.6 10*3/uL (ref 1.7–7.7)
Neutrophils Relative %: 77 %
Neutrophils Relative %: 76 %
Platelet Count: 81 10*3/uL — ABNORMAL LOW (ref 150–400)
Platelet Count: 137 10*3/uL — ABNORMAL LOW (ref 150–400)
RBC: 1.85 MIL/uL — ABNORMAL LOW (ref 3.87–5.11)
RBC: 3.17 MIL/uL — ABNORMAL LOW (ref 3.87–5.11)
RDW: 18.6 % — ABNORMAL HIGH (ref 11.5–15.5)
RDW: 19.3 % — ABNORMAL HIGH (ref 11.5–15.5)
WBC Count: 3.6 10*3/uL — ABNORMAL LOW (ref 4.0–10.5)
WBC Count: 6.1 10*3/uL (ref 4.0–10.5)
nRBC: 0 % (ref 0.0–0.2)
nRBC: 0 % (ref 0.0–0.2)

## 2021-07-08 LAB — SAMPLE TO BLOOD BANK

## 2021-07-08 MED ORDER — LIDOCAINE-PRILOCAINE 2.5-2.5 % EX CREA: TOPICAL_CREAM | Freq: Once | CUTANEOUS | Status: DC

## 2021-07-08 MED ORDER — SODIUM CHLORIDE 0.9 % IV SOLN
10.0000 mg | Freq: Once | INTRAVENOUS | Status: AC
  Administered 2021-07-08: 10 mg via INTRAVENOUS
  Filled 2021-07-08: qty 1

## 2021-07-08 MED ORDER — FLUOROURACIL CHEMO INJECTION 2.5 GM/50ML
400.0000 mg/m2 | Freq: Once | INTRAVENOUS | Status: AC
  Administered 2021-07-08: 650 mg via INTRAVENOUS
  Filled 2021-07-08: qty 13

## 2021-07-08 MED ORDER — LIDOCAINE-PRILOCAINE 2.5-2.5 % EX CREA: 1.0000 "application " | TOPICAL_CREAM | CUTANEOUS | 0 refills | Status: DC | PRN

## 2021-07-08 MED ORDER — SODIUM CHLORIDE 0.9 % IV SOLN
2000.0000 mg/m2 | INTRAVENOUS | Status: DC
  Administered 2021-07-08: 3350 mg via INTRAVENOUS
  Filled 2021-07-08: qty 67

## 2021-07-08 MED ORDER — PALONOSETRON HCL INJECTION 0.25 MG/5ML
0.2500 mg | Freq: Once | INTRAVENOUS | Status: AC
Start: 2021-07-08 — End: 2021-07-08
  Administered 2021-07-08: 0.25 mg via INTRAVENOUS
  Filled 2021-07-08: qty 5

## 2021-07-08 MED ORDER — LEUCOVORIN CALCIUM INJECTION 350 MG
400.0000 mg/m2 | Freq: Once | INTRAVENOUS | Status: AC
  Administered 2021-07-08: 672 mg via INTRAVENOUS
  Filled 2021-07-08: qty 33.6

## 2021-07-08 MED ORDER — LIDOCAINE-PRILOCAINE 2.5-2.5 % EX CREA
TOPICAL_CREAM | CUTANEOUS | 2 refills | Status: DC
Start: 2021-07-08 — End: 2021-10-09

## 2021-07-08 MED ORDER — DEXTROSE 5 % IV SOLN: Freq: Once | INTRAVENOUS | Status: AC

## 2021-07-08 MED ORDER — OXALIPLATIN CHEMO INJECTION 100 MG/20ML
65.0000 mg/m2 | Freq: Once | INTRAVENOUS | Status: AC
  Administered 2021-07-08: 110 mg via INTRAVENOUS
  Filled 2021-07-08: qty 20

## 2021-07-08 NOTE — Progress Notes (Signed)
CRITICAL VALUE STICKER  CRITICAL VALUE: Hgb 48.  RECEIVER (on-site recipient of call):Rether Rison,RN  DATE & TIME NOTIFIED: 07/08/21 @ 1034  MESSENGER (representative from lab):Diane  MD NOTIFIED: Dr. Benay Spice  TIME OF NOTIFICATION: 7939  RESPONSE: Re-draw sample to confirm.

## 2021-07-08 NOTE — Progress Notes (Signed)
Hgb re-check = 8.1

## 2021-07-08 NOTE — Addendum Note (Signed)
Addended by: Lenox Ponds E on: 07/08/2021 12:26 PM   Modules accepted: Orders

## 2021-07-08 NOTE — Progress Notes (Signed)
[facility] OFFICE PROGRESS NOTE   Diagnosis: Rectal cancer  INTERVAL HISTORY:   Ms. Dolin returns as scheduled.  She has multiple complaints including chronic back/leg pain, insomnia, and leg swelling.  She has intermittent rectal bleeding.  Objective:  Vital signs in last 24 hours:  Blood pressure (!) 150/48, pulse 93, temperature 98 F (36.7 C), temperature source Oral, resp. rate 18, height 5' (1.524 m), weight 149 lb 3.2 oz (67.7 kg), SpO2 100 %.    HEENT: No thrush or ulcers Resp: Mild bilateral expiratory wheeze, distant breath sounds, no respiratory distress Cardio: Regular rate and rhythm GI: No hepatosplenomegaly, nontender, no mass Vascular: Trace edema at the lower leg bilaterally  Skin: Faint confluent erythema at the bilateral pretibial area  Portacath/PICC-without erythema  Lab Results:  Lab Results  Component Value Date   WBC 6.1 07/08/2021   HGB 8.1 (L) 07/08/2021   HCT 27.4 (L) 07/08/2021   MCV 86.4 07/08/2021   PLT 137 (L) 07/08/2021   NEUTROABS 4.6 07/08/2021    CMP  Lab Results  Component Value Date   NA 140 07/08/2021   K 3.3 (L) 07/08/2021   CL 104 07/08/2021   CO2 24 07/08/2021   GLUCOSE 303 (H) 07/08/2021   BUN 13 07/08/2021   CREATININE 0.91 07/08/2021   CALCIUM 8.2 (L) 07/08/2021   PROT 6.0 (L) 07/08/2021   ALBUMIN 3.4 (L) 07/08/2021   AST 12 (L) 07/08/2021   ALT 6 07/08/2021   ALKPHOS 114 07/08/2021   BILITOT 0.3 07/08/2021   GFRNONAA >60 07/08/2021   GFRAA >60 03/29/2020    Lab Results  Component Value Date   CEA1 28.60 (H) 06/12/2021   CEA 31.01 (H) 06/12/2021    Medications: I have reviewed the patient's current medications.   Assessment/Plan: Rectal cancer Partially obstructing mass beginning at 10 cm from the anal verge on colonoscopy 01/23/2021, biopsy-adenocarcinoma, immunohistochemical stains at [facility]-CK20, CDX-2, and SATB2 positive, patchy strong staining for p16.  The rectal tumor has a distinct  immunohistochemical profile suggesting a primary colorectal tumor as opposed to metastatic cervical cancer. CT abdomen/pelvis 01/18/2021-bilateral hydronephrosis, endometrial fluid collection, prominent stool in the colon MRI pelvis 01/23/2021-tumor at 7.6 cm from the anal verge, abnormal signal bridge in the cervix, mesorectum, and anterior aspect of the rectum-similar to MRI from 2020, MRI stage could not be defined secondary to posttreatment changes in the pelvis 03/18/2021-total abdominal hysterectomy, cystoscopy with insertion of ureteral stents, exploratory laparotomy, aborted low anterior resection, descending loop colostomy--right and left fallopian tubes and ovaries negative for malignancy; excision portion of cervix positive for adenocarcinoma; uterus with invasive moderately differentiated adenocarcinoma of the cervix, HPV associated, invading through full-thickness of the cervix/lower uterine segment and into parametrial tissue, margins of resection received disrupted, cannot be evaluated; remaining uterus with extensive endocervical adenocarcinoma in situ involving lower uterine segment and replacing endometrium.  This carcinoma was felt to be a separate primary from the rectal tumor. PET scan at [facility] 04/11/2021-intense FDG activity in region of known rectal mass.  Hypermetabolic left external iliac and right inguinal lymph nodes.  Minimal FDG uptake and small right supraclavicular lymph node.  No uptake seen in the cervix. 04/23/2021 FNA right inguinal lymph node-negative for malignancy Evaluated by Dr. Leamon Arnt 05/21/2021, 05/28/2021-appears to have 2 synchronous malignancies (cervical adenocarcinoma and rectal adenocarcinoma); further surgery which would entail pelvic exenteration not recommended by gynecologic oncology or colorectal surgery.  Full doses of radiotherapy not felt to be feasible.  Recommended 3 months of CAPOX  and then repeat MRI of abdomen/pelvis, chest CT and colonoscopy. Cycle 1 Xeloda  06/13/2021 Cycle 1 FOLFOX 07/08/2021, oxaliplatin dose reduced secondary to pre-existing neuropathy     Bilateral hydronephrosis-secondary to urinary retention? Hematometra found on pelvic MRI 01/23/2021, evaluated by gynecologic oncology Cervical cancer November 1994, stage Ib, treated with external beam radiation and brachytherapy at [facility] Diabetes Neuropathy Chronic pain secondary to #6 Recurrent urinary tract infections History of a CVA History of hyperthyroidism G3, P3 COVID-19 infection January 2021    Disposition: Ms. Duclos appears stable.  She will begin cycle 1 oxaliplatin today.  I dose reduced the oxaliplatin secondary to neuropathy and anemia/thrombocytopenia.  She has progressive anemia.  She knows to call for symptoms of anemia.  We will check a blood bank sample and provide transfusion support as needed when she is here in 2 weeks.    Betsy Coder, MD  07/08/2021  11:15 AM

## 2021-07-08 NOTE — Patient Instructions (Addendum)
The chemotherapy medication bag should finish at 46 hours, 96 hours, or 7 days. For example, if your pump is scheduled for 46 hours and it was put on at 4:00 p.m., it should finish at 2:00 p.m. the day it is scheduled to come off regardless of your appointment time.     Estimated time to finish at 130 Wednesday    If the display on your pump reads "Low Volume" and it is beeping, take the batteries out of the pump and come to the cancer center for it to be taken off.   If the pump alarms go off prior to the pump reading "Low Volume" then call [phone] and someone can assist you.  If the plunger comes out and the chemotherapy medication is leaking out, please use your home chemo spill kit to clean up the spill. Do NOT use paper towels or other household products.  If you have problems or questions regarding your pump, please call either [phone] (24 hours a day) or the cancer center Monday-Friday 8:00 a.m.- 4:30 p.m. at the clinic number and we will assist you. If you are unable to get assistance, then go to the nearest Emergency Department and ask the staff to contact the IV team for assistance.    [facility]  Discharge Instructions: Thank you for choosing [facility] to provide your oncology and hematology care.   If you have a lab appointment with the [facility], please go directly to the [facility] and check in at the registration area.   Wear comfortable clothing and clothing appropriate for easy access to any Portacath or PICC line.   We strive to give you quality time with your provider. You may need to reschedule your appointment if you arrive late (15 or more minutes).  Arriving late affects you and other patients whose appointments are after yours.  Also, if you miss three or more appointments without notifying the office, you may be dismissed from the clinic at the provider's discretion.      For prescription refill  requests, have your pharmacy contact our office and allow 72 hours for refills to be completed.    Today you received the following chemotherapy and/or immunotherapy agents oxaliplatin, leucovorin, fluorouracil   To help prevent nausea and vomiting after your treatment, we encourage you to take your nausea medication as directed.  BELOW ARE SYMPTOMS THAT SHOULD BE REPORTED IMMEDIATELY: *FEVER GREATER THAN 100.4 F (38 C) OR HIGHER *CHILLS OR SWEATING *NAUSEA AND VOMITING THAT IS NOT CONTROLLED WITH YOUR NAUSEA MEDICATION *UNUSUAL SHORTNESS OF BREATH *UNUSUAL BRUISING OR BLEEDING *URINARY PROBLEMS (pain or burning when urinating, or frequent urination) *BOWEL PROBLEMS (unusual diarrhea, constipation, pain near the anus) TENDERNESS IN MOUTH AND THROAT WITH OR WITHOUT PRESENCE OF ULCERS (sore throat, sores in mouth, or a toothache) UNUSUAL RASH, SWELLING OR PAIN  UNUSUAL VAGINAL DISCHARGE OR ITCHING   Items with * indicate a potential emergency and should be followed up as soon as possible or go to the Emergency Department if any problems should occur.  Please show the CHEMOTHERAPY ALERT CARD or IMMUNOTHERAPY ALERT CARD at check-in to the Emergency Department and triage nurse.  Should you have questions after your visit or need to cancel or reschedule your appointment, please contact [facility]  Dept: [phone]  and follow the prompts.  Office hours are 8:00 a.m. to 4:30 p.m. Monday - Friday. Please note that voicemails left after 4:00 p.m.  may not be returned until the following business day.  We are closed weekends and major holidays. You have access to a nurse at all times for urgent questions. Please call the main number to the clinic Dept: [phone] and follow the prompts.   For any non-urgent questions, you may also contact your provider using MyChart. We now offer e-Visits for anyone 33 and older to request care online for non-urgent symptoms. For  details visit [email or web address].   Also download the MyChart app! Go to the app store, search "MyChart", open the app, select [facility], and log in with your MyChart username and password.  Due to Covid, a mask is required upon entering the hospital/clinic. If you do not have a mask, one will be given to you upon arrival. For doctor visits, patients may have 1 support person aged 65 or older with them. For treatment visits, patients cannot have anyone with them due to current Covid guidelines and our immunocompromised population.   Oxaliplatin Injection What is this medication? OXALIPLATIN (ox AL i PLA tin) is a chemotherapy drug. It targets fast dividing cells, like cancer cells, and causes these cells to die. This medicine is usedto treat cancers of the colon and rectum, and many other cancers. This medicine may be used for other purposes; ask your health care provider orpharmacist if you have questions. COMMON BRAND NAME(S): Eloxatin What should I tell my care team before I take this medication? They need to know if you have any of these conditions: heart disease history of irregular heartbeat liver disease low blood counts, like white cells, platelets, or red blood cells lung or breathing disease, like asthma take medicines that treat or prevent blood clots tingling of the fingers or toes, or other nerve disorder an unusual or allergic reaction to oxaliplatin, other chemotherapy, other medicines, foods, dyes, or preservatives pregnant or trying to get pregnant breast-feeding How should I use this medication? This drug is given as an infusion into a vein. It is administered in a hospitalor clinic by a specially trained health care professional. Talk to your pediatrician regarding the use of this medicine in children.Special care may be needed. Overdosage: If you think you have taken too much of this medicine contact apoison control center or emergency room at once. NOTE: This  medicine is only for you. Do not share this medicine with others. What if I miss a dose? It is important not to miss a dose. Call your doctor or health careprofessional if you are unable to keep an appointment. What may interact with this medication? Do not take this medicine with any of the following medications: cisapride dronedarone pimozide thioridazine This medicine may also interact with the following medications: aspirin and aspirin-like medicines certain medicines that treat or prevent blood clots like warfarin, apixaban, dabigatran, and rivaroxaban cisplatin cyclosporine diuretics medicines for infection like acyclovir, adefovir, amphotericin B, bacitracin, cidofovir, foscarnet, ganciclovir, gentamicin, pentamidine, vancomycin NSAIDs, medicines for pain and inflammation, like ibuprofen or naproxen other medicines that prolong the QT interval (an abnormal heart rhythm) pamidronate zoledronic acid This list may not describe all possible interactions. Give your health care provider a list of all the medicines, herbs, non-prescription drugs, or dietary supplements you use. Also tell them if you smoke, drink alcohol, or use illegaldrugs. Some items may interact with your medicine. What should I watch for while using this medication? Your condition will be monitored carefully while you are receiving thismedicine. You may need blood work done while you are taking this  medicine. This medicine may make you feel generally unwell. This is not uncommon as chemotherapy can affect healthy cells as well as cancer cells. Report any side effects. Continue your course of treatment even though you feel ill unless yourhealthcare professional tells you to stop. This medicine can make you more sensitive to cold. Do not drink cold drinks or use ice. Cover exposed skin before coming in contact with cold temperatures or cold objects. When out in cold weather wear warm clothing and cover your mouth and nose  to warm the air that goes into your lungs. Tell your doctor if you getsensitive to the cold. Do not become pregnant while taking this medicine or for 9 months after stopping it. Women should inform their health care professional if they wish to become pregnant or think they might be pregnant. Men should not father a child while taking this medicine and for 6 months after stopping it. There is potential for serious side effects to an unborn child. Talk to your health careprofessional for more information. Do not breast-feed a child while taking this medicine or for 3 months afterstopping it. This medicine has caused ovarian failure in some women. This medicine may make it more difficult to get pregnant. Talk to your health care professional if [place] concerned about your fertility. This medicine has caused decreased sperm counts in some men. This may make it more difficult to father a child. Talk to your health care professional if [place] concerned about your fertility. This medicine may increase your risk of getting an infection. Call your health care professional for advice if you get a fever, chills, or sore throat, or other symptoms of a cold or flu. Do not treat yourself. Try to avoid beingaround people who are sick. Avoid taking medicines that contain aspirin, acetaminophen, ibuprofen, naproxen, or ketoprofen unless instructed by your health care professional.These medicines may hide a fever. Be careful brushing or flossing your teeth or using a toothpick because you may get an infection or bleed more easily. If you have any dental work done, Primary school teacher you are receiving this medicine. What side effects may I notice from receiving this medication? Side effects that you should report to your doctor or health care professionalas soon as possible: allergic reactions like skin rash, itching or hives, swelling of the face, lips, or tongue breathing problems cough low blood counts - this medicine  may decrease the number of white blood cells, red blood cells, and platelets. You may be at increased risk for infections and bleeding nausea, vomiting pain, redness, or irritation at site where injected pain, tingling, numbness in the hands or feet signs and symptoms of bleeding such as bloody or black, tarry stools; red or dark brown urine; spitting up blood or brown material that looks like coffee grounds; red spots on the skin; unusual bruising or bleeding from the eyes, gums, or nose signs and symptoms of a dangerous change in heartbeat or heart rhythm like chest pain; dizziness; fast, irregular heartbeat; palpitations; feeling faint or lightheaded; falls signs and symptoms of infection like fever; chills; cough; sore throat; pain or trouble passing urine signs and symptoms of liver injury like dark yellow or brown urine; general ill feeling or flu-like symptoms; light-colored stools; loss of appetite; nausea; right upper belly pain; unusually weak or tired; yellowing of the eyes or skin signs and symptoms of low red blood cells or anemia such as unusually weak or tired; feeling faint or lightheaded; falls signs and symptoms of muscle  injury like dark urine; trouble passing urine or change in the amount of urine; unusually weak or tired; muscle pain; back pain Side effects that usually do not require medical attention (report to yourdoctor or health care professional if they continue or are bothersome): changes in taste diarrhea gas hair loss loss of appetite mouth sores This list may not describe all possible side effects. Call your doctor for medical advice about side effects. You may report side effects to FDA at1-800-FDA-1088. Where should I keep my medication? This drug is given in a hospital or clinic and will not be stored at home. NOTE: This sheet is a summary. It may not cover all possible information. If you have questions about this medicine, talk to your doctor, pharmacist,  orhealth care provider.  2022 Elsevier/Gold Standard (2019-03-23 12:20:35)  Leucovorin injection What is this medication? LEUCOVORIN (loo koe VOR in) is used to prevent or treat the harmful effects of some medicines. This medicine is used to treat anemia caused by a low amount of folic acid in the body. It is also used with 5-fluorouracil (5-FU) to treatcolon cancer. This medicine may be used for other purposes; ask your health care provider orpharmacist if you have questions. What should I tell my care team before I take this medication? They need to know if you have any of these conditions: anemia from low levels of vitamin B-12 in the blood an unusual or allergic reaction to leucovorin, folic acid, other medicines, foods, dyes, or preservatives pregnant or trying to get pregnant breast-feeding How should I use this medication? This medicine is for injection into a muscle or into a vein. It is given by ahealth care professional in a hospital or clinic setting. Talk to your pediatrician regarding the use of this medicine in children.Special care may be needed. Overdosage: If you think you have taken too much of this medicine contact apoison control center or emergency room at once. NOTE: This medicine is only for you. Do not share this medicine with others. What if I miss a dose? This does not apply. What may interact with this medication? capecitabine fluorouracil phenobarbital phenytoin primidone trimethoprim-sulfamethoxazole This list may not describe all possible interactions. Give your health care provider a list of all the medicines, herbs, non-prescription drugs, or dietary supplements you use. Also tell them if you smoke, drink alcohol, or use illegaldrugs. Some items may interact with your medicine. What should I watch for while using this medication? Your condition will be monitored carefully while you are receiving thismedicine. This medicine may increase the side effects  of 5-fluorouracil, 5-FU. Tell your doctor or health care professional if you have diarrhea or mouth sores that donot get better or that get worse. What side effects may I notice from receiving this medication? Side effects that you should report to your doctor or health care professionalas soon as possible: allergic reactions like skin rash, itching or hives, swelling of the face, lips, or tongue breathing problems fever, infection mouth sores unusual bleeding or bruising unusually weak or tired Side effects that usually do not require medical attention (report to yourdoctor or health care professional if they continue or are bothersome): constipation or diarrhea loss of appetite nausea, vomiting This list may not describe all possible side effects. Call your doctor for medical advice about side effects. You may report side effects to FDA at1-800-FDA-1088. Where should I keep my medication? This drug is given in a hospital or clinic and will not be stored at home. NOTE:  This sheet is a summary. It may not cover all possible information. If you have questions about this medicine, talk to your doctor, pharmacist, orhealth care provider.  2022 Elsevier/Gold Standard (2008-05-09 16:50:29)  Fluorouracil, 5-FU injection What is this medication? FLUOROURACIL, 5-FU (flure oh YOOR a sil) is a chemotherapy drug. It slows the growth of cancer cells. This medicine is used to treat many types of cancer like breast cancer, colon or rectal cancer, pancreatic cancer, and stomachcancer. This medicine may be used for other purposes; ask your health care provider orpharmacist if you have questions. COMMON BRAND NAME(S): Adrucil What should I tell my care team before I take this medication? They need to know if you have any of these conditions: blood disorders dihydropyrimidine dehydrogenase (DPD) deficiency infection (especially a virus infection such as chickenpox, cold sores, or herpes) kidney  disease liver disease malnourished, poor nutrition recent or ongoing radiation therapy an unusual or allergic reaction to fluorouracil, other chemotherapy, other medicines, foods, dyes, or preservatives pregnant or trying to get pregnant breast-feeding How should I use this medication? This drug is given as an infusion or injection into a vein. It is administeredin a hospital or clinic by a specially trained health care professional. Talk to your pediatrician regarding the use of this medicine in children.Special care may be needed. Overdosage: If you think you have taken too much of this medicine contact apoison control center or emergency room at once. NOTE: This medicine is only for you. Do not share this medicine with others. What if I miss a dose? It is important not to miss your dose. Call your doctor or health careprofessional if you are unable to keep an appointment. What may interact with this medication? Do not take this medicine with any of the following medications: live virus vaccines This medicine may also interact with the following medications: medicines that treat or prevent blood clots like warfarin, enoxaparin, and dalteparin This list may not describe all possible interactions. Give your health care provider a list of all the medicines, herbs, non-prescription drugs, or dietary supplements you use. Also tell them if you smoke, drink alcohol, or use illegaldrugs. Some items may interact with your medicine. What should I watch for while using this medication? Visit your doctor for checks on your progress. This drug may make you feel generally unwell. This is not uncommon, as chemotherapy can affect healthy cells as well as cancer cells. Report any side effects. Continue your course oftreatment even though you feel ill unless your doctor tells you to stop. In some cases, you may be given additional medicines to help with side effects.Follow all directions for their use. Call  your doctor or health care professional for advice if you get a fever, chills or sore throat, or other symptoms of a cold or flu. Do not treat yourself. This drug decreases your body's ability to fight infections. Try toavoid being around people who are sick. This medicine may increase your risk to bruise or bleed. Call your doctor orhealth care professional if you notice any unusual bleeding. Be careful brushing and flossing your teeth or using a toothpick because you may get an infection or bleed more easily. If you have any dental work done,tell your dentist you are receiving this medicine. Avoid taking products that contain aspirin, acetaminophen, ibuprofen, naproxen, or ketoprofen unless instructed by your doctor. These medicines may hide afever. Do not become pregnant while taking this medicine. Women should inform their doctor if they wish to become pregnant or  think they might be pregnant. There is a potential for serious side effects to an unborn child. Talk to your health care professional or pharmacist for more information. Do not breast-feed aninfant while taking this medicine. Men should inform their doctor if they wish to father a child. This medicinemay lower sperm counts. Do not treat diarrhea with over the counter products. Contact your doctor ifyou have diarrhea that lasts more than 2 days or if it is severe and watery. This medicine can make you more sensitive to the sun. Keep out of the sun. If you cannot avoid being in the sun, wear protective clothing and use sunscreen.Do not use sun lamps or tanning beds/booths. What side effects may I notice from receiving this medication? Side effects that you should report to your doctor or health care professionalas soon as possible: allergic reactions like skin rash, itching or hives, swelling of the face, lips, or tongue low blood counts - this medicine may decrease the number of white blood cells, red blood cells and platelets. You may be at  increased risk for infections and bleeding. signs of infection - fever or chills, cough, sore throat, pain or difficulty passing urine signs of decreased platelets or bleeding - bruising, pinpoint red spots on the skin, black, tarry stools, blood in the urine signs of decreased red blood cells - unusually weak or tired, fainting spells, lightheadedness breathing problems changes in vision chest pain mouth sores nausea and vomiting pain, swelling, redness at site where injected pain, tingling, numbness in the hands or feet redness, swelling, or sores on hands or feet stomach pain unusual bleeding Side effects that usually do not require medical attention (report to yourdoctor or health care professional if they continue or are bothersome): changes in finger or toe nails diarrhea dry or itchy skin hair loss headache loss of appetite sensitivity of eyes to the light stomach upset unusually teary eyes This list may not describe all possible side effects. Call your doctor for medical advice about side effects. You may report side effects to FDA at1-800-FDA-1088. Where should I keep my medication? This drug is given in a hospital or clinic and will not be stored at home. NOTE: This sheet is a summary. It may not cover all possible information. If you have questions about this medicine, talk to your doctor, pharmacist, orhealth care provider.  2022 Elsevier/Gold Standard (2019-10-04 15:00:03)

## 2021-07-09 LAB — SURGICAL PATHOLOGY

## 2021-07-10 ENCOUNTER — Other Ambulatory Visit: Payer: Self-pay

## 2021-07-10 ENCOUNTER — Inpatient Hospital Stay: Payer: Medicare HMO

## 2021-07-10 VITALS — BP 135/44 | HR 70 | Temp 98.2°F | Resp 18

## 2021-07-10 DIAGNOSIS — Z5111 Encounter for antineoplastic chemotherapy: Secondary | ICD-10-CM | POA: Diagnosis not present

## 2021-07-10 DIAGNOSIS — M545 Low back pain, unspecified: Secondary | ICD-10-CM | POA: Diagnosis not present

## 2021-07-10 DIAGNOSIS — M5417 Radiculopathy, lumbosacral region: Secondary | ICD-10-CM | POA: Diagnosis not present

## 2021-07-10 DIAGNOSIS — G894 Chronic pain syndrome: Secondary | ICD-10-CM | POA: Diagnosis not present

## 2021-07-10 DIAGNOSIS — D6959 Other secondary thrombocytopenia: Secondary | ICD-10-CM | POA: Diagnosis not present

## 2021-07-10 DIAGNOSIS — M5440 Lumbago with sciatica, unspecified side: Secondary | ICD-10-CM | POA: Diagnosis not present

## 2021-07-10 DIAGNOSIS — C20 Malignant neoplasm of rectum: Secondary | ICD-10-CM

## 2021-07-10 DIAGNOSIS — M791 Myalgia, unspecified site: Secondary | ICD-10-CM | POA: Diagnosis not present

## 2021-07-10 DIAGNOSIS — E039 Hypothyroidism, unspecified: Secondary | ICD-10-CM | POA: Diagnosis not present

## 2021-07-10 DIAGNOSIS — R69 Illness, unspecified: Secondary | ICD-10-CM | POA: Diagnosis not present

## 2021-07-10 DIAGNOSIS — Z79899 Other long term (current) drug therapy: Secondary | ICD-10-CM | POA: Diagnosis not present

## 2021-07-10 DIAGNOSIS — C539 Malignant neoplasm of cervix uteri, unspecified: Secondary | ICD-10-CM | POA: Diagnosis not present

## 2021-07-10 DIAGNOSIS — M48061 Spinal stenosis, lumbar region without neurogenic claudication: Secondary | ICD-10-CM | POA: Diagnosis not present

## 2021-07-10 DIAGNOSIS — D6481 Anemia due to antineoplastic chemotherapy: Secondary | ICD-10-CM | POA: Diagnosis not present

## 2021-07-10 DIAGNOSIS — E089 Diabetes mellitus due to underlying condition without complications: Secondary | ICD-10-CM | POA: Diagnosis not present

## 2021-07-10 MED ORDER — SODIUM CHLORIDE 0.9% FLUSH
10.0000 mL | INTRAVENOUS | Status: DC | PRN
  Administered 2021-07-10: 10 mL

## 2021-07-10 MED ORDER — HEPARIN SOD (PORK) LOCK FLUSH 100 UNIT/ML IV SOLN
500.0000 [IU] | Freq: Once | INTRAVENOUS | Status: AC | PRN
  Administered 2021-07-10: 500 [IU]

## 2021-07-10 NOTE — Progress Notes (Signed)
Patient in for pump d/c today after first chemo treatment on 07/08/21.  Pt states she tolerated treatment well but had some concerns with facial swelling and redness.  Ned Card, PA notified (see her progress note for further information).  Patient instructed to call office with any additional questions or concerns.

## 2021-07-10 NOTE — Progress Notes (Signed)
[name] presents the office today for pump discontinuation following cycle 1 FOLFOX.  Nurse requested she be evaluated due to facial flushing, swelling over the cheeks and a headache.  [name] reports she noticed redness over both malar regions yesterday right greater than left.  She thought the right side looked swollen.  She denies associated pain/discomfort.  Specifically no tooth or jaw pain.  No fever.  On exam there is erythema over bilateral malar regions right greater than left, slight fullness also right greater than left, nontender; no mouth sores.  We discussed that the flushing may be related to the dexamethasone premedication.  Etiology of the malar fullness is unclear.  She understands to contact the office if any symptoms worsen or she develops new symptoms such as a fever, tooth/jaw pain.  She will try Benadryl when she gets home.

## 2021-07-10 NOTE — Progress Notes (Signed)
Pt here for pump stop after receiving first time folfox with complaints of redness and swelling of the cheeks x 2 days and headache. Pt reports a low grade fever yesterday.  Dr Benay Spice and Ned Card, NP notified

## 2021-07-11 DIAGNOSIS — I69351 Hemiplegia and hemiparesis following cerebral infarction affecting right dominant side: Secondary | ICD-10-CM | POA: Diagnosis not present

## 2021-07-11 DIAGNOSIS — E039 Hypothyroidism, unspecified: Secondary | ICD-10-CM | POA: Diagnosis not present

## 2021-07-11 DIAGNOSIS — D630 Anemia in neoplastic disease: Secondary | ICD-10-CM | POA: Diagnosis not present

## 2021-07-11 DIAGNOSIS — I2510 Atherosclerotic heart disease of native coronary artery without angina pectoris: Secondary | ICD-10-CM | POA: Diagnosis not present

## 2021-07-11 DIAGNOSIS — I10 Essential (primary) hypertension: Secondary | ICD-10-CM | POA: Diagnosis not present

## 2021-07-11 DIAGNOSIS — I7090 Unspecified atherosclerosis: Secondary | ICD-10-CM | POA: Diagnosis not present

## 2021-07-11 DIAGNOSIS — Z433 Encounter for attention to colostomy: Secondary | ICD-10-CM | POA: Diagnosis not present

## 2021-07-11 DIAGNOSIS — C538 Malignant neoplasm of overlapping sites of cervix uteri: Secondary | ICD-10-CM | POA: Diagnosis not present

## 2021-07-11 DIAGNOSIS — E119 Type 2 diabetes mellitus without complications: Secondary | ICD-10-CM | POA: Diagnosis not present

## 2021-07-11 DIAGNOSIS — C20 Malignant neoplasm of rectum: Secondary | ICD-10-CM | POA: Diagnosis not present

## 2021-07-12 ENCOUNTER — Telehealth: Payer: Self-pay | Admitting: Nurse Practitioner

## 2021-07-12 NOTE — Telephone Encounter (Signed)
I called Ms. Spielberg to follow-up on the facial redness/fullness.  Her daughter Maudie Mercury answered the phone.  She reports Ms. Villafuerte seems to be doing well, back to baseline.  She feels that Ms. Witt tolerated the treatment well.  She understands to contact the office with any problems prior to her next visit.

## 2021-07-19 DIAGNOSIS — I7090 Unspecified atherosclerosis: Secondary | ICD-10-CM | POA: Diagnosis not present

## 2021-07-19 DIAGNOSIS — I69351 Hemiplegia and hemiparesis following cerebral infarction affecting right dominant side: Secondary | ICD-10-CM | POA: Diagnosis not present

## 2021-07-19 DIAGNOSIS — C20 Malignant neoplasm of rectum: Secondary | ICD-10-CM | POA: Diagnosis not present

## 2021-07-19 DIAGNOSIS — E119 Type 2 diabetes mellitus without complications: Secondary | ICD-10-CM | POA: Diagnosis not present

## 2021-07-19 DIAGNOSIS — I10 Essential (primary) hypertension: Secondary | ICD-10-CM | POA: Diagnosis not present

## 2021-07-19 DIAGNOSIS — D630 Anemia in neoplastic disease: Secondary | ICD-10-CM | POA: Diagnosis not present

## 2021-07-19 DIAGNOSIS — I2510 Atherosclerotic heart disease of native coronary artery without angina pectoris: Secondary | ICD-10-CM | POA: Diagnosis not present

## 2021-07-19 DIAGNOSIS — C538 Malignant neoplasm of overlapping sites of cervix uteri: Secondary | ICD-10-CM | POA: Diagnosis not present

## 2021-07-19 DIAGNOSIS — Z433 Encounter for attention to colostomy: Secondary | ICD-10-CM | POA: Diagnosis not present

## 2021-07-19 DIAGNOSIS — E039 Hypothyroidism, unspecified: Secondary | ICD-10-CM | POA: Diagnosis not present

## 2021-07-22 ENCOUNTER — Other Ambulatory Visit: Payer: Self-pay | Admitting: Oncology

## 2021-07-23 ENCOUNTER — Inpatient Hospital Stay: Payer: Medicare HMO

## 2021-07-23 ENCOUNTER — Inpatient Hospital Stay: Payer: Medicare HMO | Attending: Nurse Practitioner | Admitting: Nurse Practitioner

## 2021-07-23 ENCOUNTER — Other Ambulatory Visit: Payer: Self-pay

## 2021-07-23 ENCOUNTER — Encounter: Payer: Self-pay | Admitting: Nurse Practitioner

## 2021-07-23 VITALS — BP 131/40 | HR 71 | Temp 98.1°F | Resp 18 | Ht 60.0 in | Wt 148.0 lb

## 2021-07-23 DIAGNOSIS — Z5111 Encounter for antineoplastic chemotherapy: Secondary | ICD-10-CM | POA: Insufficient documentation

## 2021-07-23 DIAGNOSIS — D701 Agranulocytosis secondary to cancer chemotherapy: Secondary | ICD-10-CM | POA: Insufficient documentation

## 2021-07-23 DIAGNOSIS — C539 Malignant neoplasm of cervix uteri, unspecified: Secondary | ICD-10-CM | POA: Diagnosis not present

## 2021-07-23 DIAGNOSIS — T451X5A Adverse effect of antineoplastic and immunosuppressive drugs, initial encounter: Secondary | ICD-10-CM | POA: Diagnosis not present

## 2021-07-23 DIAGNOSIS — D6959 Other secondary thrombocytopenia: Secondary | ICD-10-CM | POA: Diagnosis not present

## 2021-07-23 DIAGNOSIS — C20 Malignant neoplasm of rectum: Secondary | ICD-10-CM | POA: Diagnosis not present

## 2021-07-23 LAB — CBC WITH DIFFERENTIAL (CANCER CENTER ONLY)
Abs Immature Granulocytes: 0 10*3/uL (ref 0.00–0.07)
Basophils Absolute: 0 10*3/uL (ref 0.0–0.1)
Basophils Relative: 1 %
Eosinophils Absolute: 0.1 10*3/uL (ref 0.0–0.5)
Eosinophils Relative: 5 %
HCT: 24.8 % — ABNORMAL LOW (ref 36.0–46.0)
Hemoglobin: 7.5 g/dL — ABNORMAL LOW (ref 12.0–15.0)
Immature Granulocytes: 0 %
Lymphocytes Relative: 39 %
Lymphs Abs: 0.7 10*3/uL (ref 0.7–4.0)
MCH: 26.2 pg (ref 26.0–34.0)
MCHC: 30.2 g/dL (ref 30.0–36.0)
MCV: 86.7 fL (ref 80.0–100.0)
Monocytes Absolute: 0.3 10*3/uL (ref 0.1–1.0)
Monocytes Relative: 14 %
Neutro Abs: 0.8 10*3/uL — ABNORMAL LOW (ref 1.7–7.7)
Neutrophils Relative %: 41 %
Platelet Count: 95 10*3/uL — ABNORMAL LOW (ref 150–400)
RBC: 2.86 MIL/uL — ABNORMAL LOW (ref 3.87–5.11)
RDW: 21.7 % — ABNORMAL HIGH (ref 11.5–15.5)
WBC Count: 1.9 10*3/uL — ABNORMAL LOW (ref 4.0–10.5)
nRBC: 0 % (ref 0.0–0.2)

## 2021-07-23 LAB — CMP (CANCER CENTER ONLY)
ALT: 8 U/L (ref 0–44)
AST: 15 U/L (ref 15–41)
Albumin: 3.4 g/dL — ABNORMAL LOW (ref 3.5–5.0)
Alkaline Phosphatase: 120 U/L (ref 38–126)
Anion gap: 13 (ref 5–15)
BUN: 15 mg/dL (ref 8–23)
CO2: 23 mmol/L (ref 22–32)
Calcium: 8.2 mg/dL — ABNORMAL LOW (ref 8.9–10.3)
Chloride: 106 mmol/L (ref 98–111)
Creatinine: 1.07 mg/dL — ABNORMAL HIGH (ref 0.44–1.00)
GFR, Estimated: 54 mL/min — ABNORMAL LOW (ref >60)
Glucose, Bld: 235 mg/dL — ABNORMAL HIGH (ref 70–99)
Potassium: 3.5 mmol/L (ref 3.5–5.1)
Sodium: 142 mmol/L (ref 135–145)
Total Bilirubin: 0.3 mg/dL (ref 0.3–1.2)
Total Protein: 5.8 g/dL — ABNORMAL LOW (ref 6.5–8.1)

## 2021-07-23 LAB — PREPARE RBC (CROSSMATCH)

## 2021-07-23 LAB — SAMPLE TO BLOOD BANK

## 2021-07-23 MED FILL — Fluorouracil IV Soln 5 GM/100ML (50 MG/ML): INTRAVENOUS | Qty: 67 | Status: AC

## 2021-07-23 NOTE — Addendum Note (Signed)
Addended by: Raynelle Bring on: 07/23/2021 09:36 AM   Modules accepted: Orders, SmartSet

## 2021-07-23 NOTE — Progress Notes (Signed)
[place] OFFICE PROGRESS NOTE   Diagnosis: Rectal cancer  INTERVAL HISTORY:   [name] returns as scheduled.  She completed cycle 1 FOLFOX 07/23/2021.  She denies nausea/vomiting.  She developed a 'blister" at the right upper gumline which has resolved.  She has had these in the past and does not feel it was related to treatment.  No diarrhea.  She developed a "blood blister" along the medial aspect right upper foot after stepping on a toy.  Daughter has been applying Neosporin and covering with a Band-Aid.  Recent dizziness.  Intermittent rectal bleeding.  Objective:  Vital signs in last 24 hours:  Blood pressure (!) 131/40, pulse 71, temperature 98.1 F (36.7 C), temperature source Oral, resp. rate 18, height 5' (1.524 m), weight 148 lb (67.1 kg), SpO2 99 %.    HEENT: No thrush or ulcers. Resp: Distant breath sounds.  No respiratory distress.   Cardio: Regular rate and rhythm. GI: Abdomen soft and nontender.  Left lower quadrant colostomy.  No hepatomegaly. Vascular: Trace edema lower leg bilaterally. Skin: Faint erythema bilateral pretibial regions.  Resolving blister type lesion medial aspect right upper foot.  No surrounding erythema.  Palms and soles without erythema. Port-A-Cath without erythema.  Lab Results:  Lab Results  Component Value Date   WBC 1.9 (L) 07/23/2021   HGB 7.5 (L) 07/23/2021   HCT 24.8 (L) 07/23/2021   MCV 86.7 07/23/2021   PLT 95 (L) 07/23/2021   NEUTROABS 0.8 (L) 07/23/2021    Imaging:  No results found.  Medications: I have reviewed the patient's current medications.  Assessment/Plan: Rectal cancer Partially obstructing mass beginning at 10 cm from the anal verge on colonoscopy 01/23/2021, biopsy-adenocarcinoma, immunohistochemical stains at [facility], CDX-2, and SATB2 positive, patchy strong staining for p16.  The rectal tumor has a distinct immunohistochemical profile suggesting a primary colorectal tumor as opposed to  metastatic cervical cancer. CT abdomen/pelvis 01/18/2021-bilateral hydronephrosis, endometrial fluid collection, prominent stool in the colon MRI pelvis 01/23/2021-tumor at 7.6 cm from the anal verge, abnormal signal bridge in the cervix, mesorectum, and anterior aspect of the rectum-similar to MRI from 2020, MRI stage could not be defined secondary to posttreatment changes in the pelvis 03/18/2021-total abdominal hysterectomy, cystoscopy with insertion of ureteral stents, exploratory laparotomy, aborted low anterior resection, descending loop colostomy--right and left fallopian tubes and ovaries negative for malignancy; excision portion of cervix positive for adenocarcinoma; uterus with invasive moderately differentiated adenocarcinoma of the cervix, HPV associated, invading through full-thickness of the cervix/lower uterine segment and into parametrial tissue, margins of resection received disrupted, cannot be evaluated; remaining uterus with extensive endocervical adenocarcinoma in situ involving lower uterine segment and replacing endometrium.  This carcinoma was felt to be a separate primary from the rectal tumor. PET scan at [facility] 04/11/2021-intense FDG activity in region of known rectal mass.  Hypermetabolic left external iliac and right inguinal lymph nodes.  Minimal FDG uptake and small right supraclavicular lymph node.  No uptake seen in the cervix. 04/23/2021 FNA right inguinal lymph node-negative for malignancy Evaluated by Dr. Leamon Arnt 05/21/2021, 05/28/2021-appears to have 2 synchronous malignancies (cervical adenocarcinoma and rectal adenocarcinoma); further surgery which would entail pelvic exenteration not recommended by gynecologic oncology or colorectal surgery.  Full doses of radiotherapy not felt to be feasible.  Recommended 3 months of CAPOX and then repeat MRI of abdomen/pelvis, chest CT and colonoscopy. Cycle 1 Xeloda 06/13/2021 Cycle 1 FOLFOX 07/08/2021, oxaliplatin dose reduced secondary to  pre-existing neuropathy     Bilateral hydronephrosis-secondary  to urinary retention? Hematometra found on pelvic MRI 01/23/2021, evaluated by gynecologic oncology Cervical cancer November 1994, stage Ib, treated with external beam radiation and brachytherapy at [facility] Diabetes Neuropathy Chronic pain secondary to #6 Recurrent urinary tract infections History of a CVA History of hyperthyroidism G3, P3 COVID-19 infection January 2021    Disposition: [name] appears stable.  She has completed 1 cycle of FOLFOX.  She tolerated without significant acute toxicity.  Review of the CBC from today shows pancytopenia with moderate neutropenia.  Neutropenic and thrombocytopenic precautions reviewed.  She understands to contact the office with fever, chills, other signs of infection, bleeding.  We are holding today's treatment and rescheduling for next week.  We discussed adding white cell growth factor support on day of pump discontinuation and reviewed potential toxicities including bone pain, rash, splenic rupture.  She and her daughter agree with this plan.  She has symptomatic anemia.  We are making arrangements for a blood transfusion.  We discussed risk/benefits associated with a blood transfusion.  She agrees to proceed.  She will return for lab, follow-up, FOLFOX on 07/29/2021.  She will contact the office in the interim as outlined above or with any other problems.  Plan reviewed with Dr. Benay Spice.    Ned Card ANP/GNP-BC   07/23/2021  9:10 AM

## 2021-07-23 NOTE — Patient Instructions (Signed)
Implanted [facility] Guide An implanted port is a device that is placed under the skin. It is usually placed in the chest. The device can be used to give IV medicine, to take blood, or for dialysis. You may have an implanted port if: You need IV medicine that would be irritating to the small veins in your hands or arms. You need IV medicines, such as antibiotics, for a long period of time. You need IV nutrition for a long period of time. You need dialysis. When you have a port, your health care provider can choose to use the port instead of veins in your arms for these procedures. You may have fewer limitations when using a port than you would if you used other types of long-term IVs, and you will likely be able to return to normal activities after your incision heals. An implanted port has two main parts: Reservoir. The reservoir is the part where a needle is inserted to give medicines or draw blood. The reservoir is round. After it is placed, it appears as a small, raised area under your skin. Catheter. The catheter is a thin, flexible tube that connects the reservoir to a vein. Medicine that is inserted into the reservoir goes into the catheter and then into the vein. How is my port accessed? To access your port: A numbing cream may be placed on the skin over the port site. Your health care provider will put on a mask and sterile gloves. The skin over your port will be cleaned carefully with a germ-killing soap and allowed to dry. Your health care provider will gently pinch the port and insert a needle into it. Your health care provider will check for a blood return to make sure the port is in the vein and is not clogged. If your port needs to remain accessed to get medicine continuously (constant infusion), your health care provider will place a clear bandage (dressing) over the needle site. The dressing and needle will need to be changed every week, or as told by your health care provider. What  is flushing? Flushing helps keep the port from getting clogged. Follow instructions from your health care provider about how and when to flush the port. Ports are usually flushed with saline solution or a medicine called heparin. The need for flushing will depend on how the port is used: If the port is only used from time to time to give medicines or draw blood, the port may need to be flushed: Before and after medicines have been given. Before and after blood has been drawn. As part of routine maintenance. Flushing may be recommended every 4-6 weeks. If a constant infusion is running, the port may not need to be flushed. Throw away any syringes in a disposal container that is meant for sharp items (sharps container). You can buy a sharps container from a pharmacy, or you can make one by using an empty hard plastic bottle with a cover. How long will my port stay implanted? The port can stay in for as long as your health care provider thinks it is needed. When it is time for the port to come out, a surgery will be done to remove it. The surgery will be similar to the procedure that was done to put the port in. Follow these instructions at home:  Flush your port as told by your health care provider. If you need an infusion over several days, follow instructions from your health care provider about how  to take care of your port site. Make sure you: Wash your hands with soap and water before you change your dressing. If soap and water are not available, use alcohol-based hand sanitizer. Change your dressing as told by your health care provider. Place any used dressings or infusion bags into a plastic bag. Throw that bag in the trash. Keep the dressing that covers the needle clean and dry. Do not get it wet. Do not use scissors or sharp objects near the tube. Keep the tube clamped, unless it is being used. Check your port site every day for signs of infection. Check for: Redness, swelling, or  pain. Fluid or blood. Pus or a bad smell. Protect the skin around the port site. Avoid wearing bra straps that rub or irritate the site. Protect the skin around your port from seat belts. Place a soft pad over your chest if needed. Bathe or shower as told by your health care provider. The site may get wet as long as you are not actively receiving an infusion. Return to your normal activities as told by your health care provider. Ask your health care provider what activities are safe for you. Carry a medical alert card or wear a medical alert bracelet at all times. This will let health care providers know that you have an implanted port in case of an emergency. Get help right away if: You have redness, swelling, or pain at the port site. You have fluid or blood coming from your port site. You have pus or a bad smell coming from the port site. You have a fever. Summary Implanted ports are usually placed in the chest for long-term IV access. Follow instructions from your health care provider about flushing the port and changing bandages (dressings). Take care of the area around your port by avoiding clothing that puts pressure on the area, and by watching for signs of infection. Protect the skin around your port from seat belts. Place a soft pad over your chest if needed. Get help right away if you have a fever or you have redness, swelling, pain, drainage, or a bad smell at the port site. This information is not intended to replace advice given to you by your health care provider. Make sure you discuss any questions you have with your health care provider. Document Revised: 01/23/2021 Document Reviewed: 03/19/2020 Elsevier Patient Education  [place].

## 2021-07-24 ENCOUNTER — Inpatient Hospital Stay: Payer: Medicare HMO

## 2021-07-24 DIAGNOSIS — T451X5A Adverse effect of antineoplastic and immunosuppressive drugs, initial encounter: Secondary | ICD-10-CM | POA: Diagnosis not present

## 2021-07-24 DIAGNOSIS — C20 Malignant neoplasm of rectum: Secondary | ICD-10-CM | POA: Diagnosis not present

## 2021-07-24 DIAGNOSIS — C539 Malignant neoplasm of cervix uteri, unspecified: Secondary | ICD-10-CM | POA: Diagnosis not present

## 2021-07-24 DIAGNOSIS — D701 Agranulocytosis secondary to cancer chemotherapy: Secondary | ICD-10-CM | POA: Diagnosis not present

## 2021-07-24 DIAGNOSIS — Z5111 Encounter for antineoplastic chemotherapy: Secondary | ICD-10-CM | POA: Diagnosis not present

## 2021-07-24 DIAGNOSIS — D6959 Other secondary thrombocytopenia: Secondary | ICD-10-CM | POA: Diagnosis not present

## 2021-07-24 MED ORDER — SODIUM CHLORIDE 0.9% IV SOLUTION
250.0000 mL | Freq: Once | INTRAVENOUS | Status: AC
  Administered 2021-07-24: 250 mL via INTRAVENOUS

## 2021-07-24 MED ORDER — HEPARIN SOD (PORK) LOCK FLUSH 100 UNIT/ML IV SOLN
500.0000 [IU] | Freq: Every day | INTRAVENOUS | Status: AC | PRN
  Administered 2021-07-24: 500 [IU]

## 2021-07-24 MED ORDER — SODIUM CHLORIDE 0.9% FLUSH
10.0000 mL | INTRAVENOUS | Status: AC | PRN
  Administered 2021-07-24: 10 mL

## 2021-07-24 NOTE — Patient Instructions (Signed)
Blood Transfusion, Adult, Care After This sheet gives you information about how to care for yourself after your procedure. Your doctor may also give you more specific instructions. If you have problems or questions, contact your doctor. What can I expect after the procedure? After the procedure, it is common to have: Bruising and soreness at the IV site. A headache. Follow these instructions at home: Insertion site care   Follow instructions from your doctor about how to take care of your insertion site. This is where an IV tube was put into your vein. Make sure you: Wash your hands with soap and water before and after you change your bandage (dressing). If you cannot use soap and water, use hand sanitizer. Change your bandage as told by your doctor. Check your insertion site every day for signs of infection. Check for: Redness, swelling, or pain. Bleeding from the site. Warmth. Pus or a bad smell. General instructions Take over-the-counter and prescription medicines only as told by your doctor. Rest as told by your doctor. Go back to your normal activities as told by your doctor. Keep all follow-up visits as told by your doctor. This is important. Contact a doctor if: You have itching or red, swollen areas of skin (hives). You feel worried or nervous (anxious). You feel weak after doing your normal activities. You have redness, swelling, warmth, or pain around the insertion site. You have blood coming from the insertion site, and the blood does not stop with pressure. You have pus or a bad smell coming from the insertion site. Get help right away if: You have signs of a serious reaction. This may be coming from an allergy or the body's defense system (immune system). Signs include: Trouble breathing or shortness of breath. Swelling of the face or feeling warm (flushed). Fever or chills. Head, chest, or back pain. Dark pee (urine) or blood in the pee. Widespread rash. Fast  heartbeat. Feeling dizzy or light-headed. You may receive your blood transfusion in an outpatient setting. If so, you will be told whom to contact to report any reactions. These symptoms may be an emergency. Do not wait to see if the symptoms will go away. Get medical help right away. Call your local emergency services (911 in the [place] Do not drive yourself to the hospital. Summary Bruising and soreness at the IV site are common. Check your insertion site every day for signs of infection. Rest as told by your doctor. Go back to your normal activities as told by your doctor. Get help right away if you have signs of a serious reaction. This information is not intended to replace advice given to you by your health care provider. Make sure you discuss any questions you have with your health care provider. Document Revised: 02/28/2021 Document Reviewed: 04/28/2019 Elsevier Patient Education  2022 Elsevier Inc.  

## 2021-07-25 ENCOUNTER — Inpatient Hospital Stay: Payer: Medicare HMO

## 2021-07-25 LAB — TYPE AND SCREEN
ABO/RH(D): O POS
Antibody Screen: NEGATIVE
Unit division: 0
Unit division: 0

## 2021-07-25 LAB — BPAM RBC
Blood Product Expiration Date: 202210062359
Blood Product Expiration Date: 202210062359
ISSUE DATE / TIME: 202209070739
ISSUE DATE / TIME: 202209070739
Unit Type and Rh: 5100
Unit Type and Rh: 5100

## 2021-07-26 DIAGNOSIS — Z433 Encounter for attention to colostomy: Secondary | ICD-10-CM | POA: Diagnosis not present

## 2021-07-29 ENCOUNTER — Inpatient Hospital Stay: Payer: Medicare HMO

## 2021-07-29 ENCOUNTER — Other Ambulatory Visit: Payer: Self-pay

## 2021-07-29 ENCOUNTER — Inpatient Hospital Stay (HOSPITAL_BASED_OUTPATIENT_CLINIC_OR_DEPARTMENT_OTHER): Payer: Medicare HMO | Admitting: Oncology

## 2021-07-29 VITALS — BP 143/50 | HR 64 | Temp 98.1°F | Resp 18 | Ht 60.0 in | Wt 152.4 lb

## 2021-07-29 DIAGNOSIS — T451X5A Adverse effect of antineoplastic and immunosuppressive drugs, initial encounter: Secondary | ICD-10-CM | POA: Diagnosis not present

## 2021-07-29 DIAGNOSIS — Z5111 Encounter for antineoplastic chemotherapy: Secondary | ICD-10-CM | POA: Diagnosis not present

## 2021-07-29 DIAGNOSIS — G894 Chronic pain syndrome: Secondary | ICD-10-CM | POA: Diagnosis not present

## 2021-07-29 DIAGNOSIS — C20 Malignant neoplasm of rectum: Secondary | ICD-10-CM

## 2021-07-29 DIAGNOSIS — C539 Malignant neoplasm of cervix uteri, unspecified: Secondary | ICD-10-CM | POA: Diagnosis not present

## 2021-07-29 DIAGNOSIS — D6959 Other secondary thrombocytopenia: Secondary | ICD-10-CM | POA: Diagnosis not present

## 2021-07-29 DIAGNOSIS — D701 Agranulocytosis secondary to cancer chemotherapy: Secondary | ICD-10-CM | POA: Diagnosis not present

## 2021-07-29 LAB — CBC WITH DIFFERENTIAL (CANCER CENTER ONLY)
Abs Immature Granulocytes: 0.06 10*3/uL (ref 0.00–0.07)
Basophils Absolute: 0 10*3/uL (ref 0.0–0.1)
Basophils Relative: 1 %
Eosinophils Absolute: 0.1 10*3/uL (ref 0.0–0.5)
Eosinophils Relative: 3 %
HCT: 38.2 % (ref 36.0–46.0)
Hemoglobin: 11.6 g/dL — ABNORMAL LOW (ref 12.0–15.0)
Immature Granulocytes: 2 %
Lymphocytes Relative: 32 %
Lymphs Abs: 1 10*3/uL (ref 0.7–4.0)
MCH: 27 pg (ref 26.0–34.0)
MCHC: 30.4 g/dL (ref 30.0–36.0)
MCV: 89 fL (ref 80.0–100.0)
Monocytes Absolute: 0.8 10*3/uL (ref 0.1–1.0)
Monocytes Relative: 25 %
Neutro Abs: 1.1 10*3/uL — ABNORMAL LOW (ref 1.7–7.7)
Neutrophils Relative %: 37 %
Platelet Count: 201 10*3/uL (ref 150–400)
RBC: 4.29 MIL/uL (ref 3.87–5.11)
RDW: 20.5 % — ABNORMAL HIGH (ref 11.5–15.5)
WBC Count: 3.1 10*3/uL — ABNORMAL LOW (ref 4.0–10.5)
nRBC: 0 % (ref 0.0–0.2)

## 2021-07-29 LAB — CMP (CANCER CENTER ONLY)
ALT: 7 U/L (ref 0–44)
AST: 14 U/L — ABNORMAL LOW (ref 15–41)
Albumin: 3.5 g/dL (ref 3.5–5.0)
Alkaline Phosphatase: 131 U/L — ABNORMAL HIGH (ref 38–126)
Anion gap: 11 (ref 5–15)
BUN: 12 mg/dL (ref 8–23)
CO2: 24 mmol/L (ref 22–32)
Calcium: 8.9 mg/dL (ref 8.9–10.3)
Chloride: 108 mmol/L (ref 98–111)
Creatinine: 0.82 mg/dL (ref 0.44–1.00)
GFR, Estimated: >60 mL/min
Glucose, Bld: 233 mg/dL — ABNORMAL HIGH (ref 70–99)
Potassium: 3.8 mmol/L (ref 3.5–5.1)
Sodium: 143 mmol/L (ref 135–145)
Total Bilirubin: 0.4 mg/dL (ref 0.3–1.2)
Total Protein: 5.9 g/dL — ABNORMAL LOW (ref 6.5–8.1)

## 2021-07-29 MED ORDER — OXALIPLATIN CHEMO INJECTION 100 MG/20ML
55.0000 mg/m2 | Freq: Once | INTRAVENOUS | Status: AC
  Administered 2021-07-29: 90 mg via INTRAVENOUS
  Filled 2021-07-29: qty 18

## 2021-07-29 MED ORDER — PALONOSETRON HCL INJECTION 0.25 MG/5ML
0.2500 mg | Freq: Once | INTRAVENOUS | Status: AC
Start: 2021-07-29 — End: 2021-07-29
  Administered 2021-07-29: 0.25 mg via INTRAVENOUS
  Filled 2021-07-29: qty 5

## 2021-07-29 MED ORDER — SODIUM CHLORIDE 0.9 % IV SOLN
10.0000 mg | Freq: Once | INTRAVENOUS | Status: AC
  Administered 2021-07-29: 10 mg via INTRAVENOUS
  Filled 2021-07-29: qty 10

## 2021-07-29 MED ORDER — SODIUM CHLORIDE 0.9 % IV SOLN
2000.0000 mg/m2 | INTRAVENOUS | Status: DC
  Administered 2021-07-29: 3350 mg via INTRAVENOUS
  Filled 2021-07-29: qty 67

## 2021-07-29 MED ORDER — SODIUM CHLORIDE 0.9% FLUSH: 10.0000 mL | INTRAVENOUS | Status: DC | PRN

## 2021-07-29 MED ORDER — FLUOROURACIL CHEMO INJECTION 2.5 GM/50ML
400.0000 mg/m2 | Freq: Once | INTRAVENOUS | Status: AC
  Administered 2021-07-29: 650 mg via INTRAVENOUS
  Filled 2021-07-29: qty 13

## 2021-07-29 MED ORDER — LEUCOVORIN CALCIUM INJECTION 350 MG
400.0000 mg/m2 | Freq: Once | INTRAVENOUS | Status: AC
  Administered 2021-07-29: 672 mg via INTRAVENOUS
  Filled 2021-07-29: qty 33.6

## 2021-07-29 MED ORDER — DEXTROSE 5 % IV SOLN: Freq: Once | INTRAVENOUS | Status: AC

## 2021-07-29 MED ORDER — NYSTATIN 100000 UNIT/GM EX POWD: 1.0000 "application " | Freq: Two times a day (BID) | CUTANEOUS | 1 refills | Status: DC

## 2021-07-29 NOTE — Progress Notes (Signed)
[place] OFFICE PROGRESS NOTE   Diagnosis: Rectal cancer, cervical cancer  INTERVAL HISTORY:   [name] returns as scheduled.  Chemotherapy was held last week secondary to neutropenia and thrombocytopenia.  She has persistent erythema in the lower legs.  She has a rash in the groin/labia bilaterally.  She has noted increased numbness at the right ankle.  Objective:  Vital signs in last 24 hours:  Blood pressure (!) 143/50, pulse 64, temperature 98.1 F (36.7 C), temperature source Oral, resp. rate 18, height 5' (1.524 m), weight 152 lb 6.4 oz (69.1 kg), SpO2 100 %.    HEENT: No thrush or ulcers Resp: Lungs clear bilaterally Cardio: Regular rate and rhythm GI: No hepatosplenomegaly, left lower quadrant colostomy with brown stool Vascular: Trace edema at the right greater than left lower leg  Skin: Mild pretibial erythema bilaterally, yeast rash in the groin bilaterally  Portacath/PICC-without erythema  Lab Results:  Lab Results  Component Value Date   WBC 3.1 (L) 07/29/2021   HGB 11.6 (L) 07/29/2021   HCT 38.2 07/29/2021   MCV 89.0 07/29/2021   PLT 201 07/29/2021   NEUTROABS 1.1 (L) 07/29/2021    CMP  Lab Results  Component Value Date   NA 142 07/23/2021   K 3.5 07/23/2021   CL 106 07/23/2021   CO2 23 07/23/2021   GLUCOSE 235 (H) 07/23/2021   BUN 15 07/23/2021   CREATININE 1.07 (H) 07/23/2021   CALCIUM 8.2 (L) 07/23/2021   PROT 5.8 (L) 07/23/2021   ALBUMIN 3.4 (L) 07/23/2021   AST 15 07/23/2021   ALT 8 07/23/2021   ALKPHOS 120 07/23/2021   BILITOT 0.3 07/23/2021   GFRNONAA 54 (L) 07/23/2021   GFRAA >60 03/29/2020    Lab Results  Component Value Date   CEA1 28.60 (H) 06/12/2021   CEA 31.01 (H) 06/12/2021    Medications: I have reviewed the patient's current medications.   Assessment/Plan: Rectal cancer Partially obstructing mass beginning at 10 cm from the anal verge on colonoscopy 01/23/2021, biopsy-adenocarcinoma,  immunohistochemical stains at [facility], CDX-2, and SATB2 positive, patchy strong staining for p16.  The rectal tumor has a distinct immunohistochemical profile suggesting a primary colorectal tumor as opposed to metastatic cervical cancer. CT abdomen/pelvis 01/18/2021-bilateral hydronephrosis, endometrial fluid collection, prominent stool in the colon MRI pelvis 01/23/2021-tumor at 7.6 cm from the anal verge, abnormal signal bridge in the cervix, mesorectum, and anterior aspect of the rectum-similar to MRI from 2020, MRI stage could not be defined secondary to posttreatment changes in the pelvis 03/18/2021-total abdominal hysterectomy, cystoscopy with insertion of ureteral stents, exploratory laparotomy, aborted low anterior resection, descending loop colostomy--right and left fallopian tubes and ovaries negative for malignancy; excision portion of cervix positive for adenocarcinoma; uterus with invasive moderately differentiated adenocarcinoma of the cervix, HPV associated, invading through full-thickness of the cervix/lower uterine segment and into parametrial tissue, margins of resection received disrupted, cannot be evaluated; remaining uterus with extensive endocervical adenocarcinoma in situ involving lower uterine segment and replacing endometrium.  This carcinoma was felt to be a separate primary from the rectal tumor. PET scan [facility] 04/11/2021-intense FDG activity in region of known rectal mass.  Hypermetabolic left external iliac and right inguinal lymph nodes.  Minimal FDG uptake and small right supraclavicular lymph node.  No uptake seen in the cervix. 04/23/2021 FNA right inguinal lymph node-negative for malignancy Evaluated by Dr. Leamon Arnt 05/21/2021, 05/28/2021-appears to have 2 synchronous malignancies (cervical adenocarcinoma and rectal adenocarcinoma); further surgery which would entail pelvic exenteration  not recommended by gynecologic oncology or colorectal surgery.  Full doses of radiotherapy not felt  to be feasible.  Recommended 3 months of CAPOX and then repeat MRI of abdomen/pelvis, chest CT and colonoscopy. Cycle 1 Xeloda 06/13/2021 Cycle 1 FOLFOX 07/08/2021, oxaliplatin dose reduced secondary to pre-existing neuropathy Cycle 2 FOLFOX on 1222, [place]     Bilateral hydronephrosis-secondary to urinary retention? Hematometra found on pelvic MRI 01/23/2021, evaluated by gynecologic oncology Cervical cancer November 1994, stage Ib, treated with external beam radiation and brachytherapy at [facility] Diabetes Neuropathy Chronic pain secondary to #6 Recurrent urinary tract infections History of a CVA History of hyperthyroidism G3, P3 COVID-19 infection January 2021      Disposition: [name] has completed 1 cycle of FOLFOX.  She tolerated the chemotherapy well, but she developed neutropenia and thrombocytopenia.  The platelet count has recovered.  She has mild neutropenia.  We discussed the risk of proceeding with chemotherapy today.  She knows to call for a high fever or symptoms of an infection.  She will receive G-CSF support with this cycle of chemotherapy.  I will further dose reduce the oxaliplatin due to thrombocytopenia with cycle 1.  She will begin nystatin powder for the groin rash.  The erythema at the lower legs is likely related to chronic stasis.  She will call for increased erythema or fever.  [name] will return for an office visit and chemotherapy in 2 weeks.  Betsy Coder, MD  07/29/2021  9:26 AM

## 2021-07-29 NOTE — Patient Instructions (Signed)
Implanted [facility] Guide An implanted port is a device that is placed under the skin. It is usually placed in the chest. The device can be used to give IV medicine, to take blood, or for dialysis. You may have an implanted port if: You need IV medicine that would be irritating to the small veins in your hands or arms. You need IV medicines, such as antibiotics, for a long period of time. You need IV nutrition for a long period of time. You need dialysis. When you have a port, your health care provider can choose to use the port instead of veins in your arms for these procedures. You may have fewer limitations when using a port than you would if you used other types of long-term IVs, and you will likely be able to return to normal activities after your incision heals. An implanted port has two main parts: Reservoir. The reservoir is the part where a needle is inserted to give medicines or draw blood. The reservoir is round. After it is placed, it appears as a small, raised area under your skin. Catheter. The catheter is a thin, flexible tube that connects the reservoir to a vein. Medicine that is inserted into the reservoir goes into the catheter and then into the vein. How is my port accessed? To access your port: A numbing cream may be placed on the skin over the port site. Your health care provider will put on a mask and sterile gloves. The skin over your port will be cleaned carefully with a germ-killing soap and allowed to dry. Your health care provider will gently pinch the port and insert a needle into it. Your health care provider will check for a blood return to make sure the port is in the vein and is not clogged. If your port needs to remain accessed to get medicine continuously (constant infusion), your health care provider will place a clear bandage (dressing) over the needle site. The dressing and needle will need to be changed every week, or as told by your health care provider. What  is flushing? Flushing helps keep the port from getting clogged. Follow instructions from your health care provider about how and when to flush the port. Ports are usually flushed with saline solution or a medicine called heparin. The need for flushing will depend on how the port is used: If the port is only used from time to time to give medicines or draw blood, the port may need to be flushed: Before and after medicines have been given. Before and after blood has been drawn. As part of routine maintenance. Flushing may be recommended every 4-6 weeks. If a constant infusion is running, the port may not need to be flushed. Throw away any syringes in a disposal container that is meant for sharp items (sharps container). You can buy a sharps container from a pharmacy, or you can make one by using an empty hard plastic bottle with a cover. How long will my port stay implanted? The port can stay in for as long as your health care provider thinks it is needed. When it is time for the port to come out, a surgery will be done to remove it. The surgery will be similar to the procedure that was done to put the port in. Follow these instructions at home:  Flush your port as told by your health care provider. If you need an infusion over several days, follow instructions from your health care provider about how  to take care of your port site. Make sure you: Wash your hands with soap and water before you change your dressing. If soap and water are not available, use alcohol-based hand sanitizer. Change your dressing as told by your health care provider. Place any used dressings or infusion bags into a plastic bag. Throw that bag in the trash. Keep the dressing that covers the needle clean and dry. Do not get it wet. Do not use scissors or sharp objects near the tube. Keep the tube clamped, unless it is being used. Check your port site every day for signs of infection. Check for: Redness, swelling, or  pain. Fluid or blood. Pus or a bad smell. Protect the skin around the port site. Avoid wearing bra straps that rub or irritate the site. Protect the skin around your port from seat belts. Place a soft pad over your chest if needed. Bathe or shower as told by your health care provider. The site may get wet as long as you are not actively receiving an infusion. Return to your normal activities as told by your health care provider. Ask your health care provider what activities are safe for you. Carry a medical alert card or wear a medical alert bracelet at all times. This will let health care providers know that you have an implanted port in case of an emergency. Get help right away if: You have redness, swelling, or pain at the port site. You have fluid or blood coming from your port site. You have pus or a bad smell coming from the port site. You have a fever. Summary Implanted ports are usually placed in the chest for long-term IV access. Follow instructions from your health care provider about flushing the port and changing bandages (dressings). Take care of the area around your port by avoiding clothing that puts pressure on the area, and by watching for signs of infection. Protect the skin around your port from seat belts. Place a soft pad over your chest if needed. Get help right away if you have a fever or you have redness, swelling, pain, drainage, or a bad smell at the port site. This information is not intended to replace advice given to you by your health care provider. Make sure you discuss any questions you have with your health care provider. Document Revised: 01/23/2021 Document Reviewed: 03/19/2020 Else[employer] Patient Education  [place].

## 2021-07-29 NOTE — Progress Notes (Signed)
Patient presents for treatment. RN assessment completed along with the following:  Treatment Conditions (labs/vitals/weight) reviewed - Yes, and per physician note, ok to proceed with ANC 1.1.     Oncology Treatment Attestation completed for current therapy- Yes, on date 07/01/21 Informed consent completed and reflects current therapy/intent - Yes, on date 07/08/21 Provider progress note reviewed - Yes, today's provider note was reviewed. Treatment/Antibody/Supportive plan reviewed - Yes, and Dr. Benay Spice to dose reduce oxaliplatin S&H and other orders reviewed - Yes, and there are no additional orders identified. Previous treatment date reviewed - Yes, and the appropriate amount of time has elapsed between treatments. Clinic Hand Off Received from - Merceda Elks, RN and Lupita Raider, RN  Patient to proceed with treatment.

## 2021-07-29 NOTE — Addendum Note (Signed)
Addended by: Tania Ade on: 07/29/2021 10:02 AM   Modules accepted: Orders

## 2021-07-29 NOTE — Patient Instructions (Addendum)
The chemotherapy medication bag should finish at 46 hours, 96 hours, or 7 days. For example, if your pump is scheduled for 46 hours and it was put on at 4:00 p.m., it should finish at 2:00 p.m. the day it is scheduled to come off regardless of your appointment time.     Estimated time to finish at 12pm on Wednesday 07/31/21.     If the display on your pump reads "Low Volume" and it is beeping, take the batteries out of the pump and come to the cancer center for it to be taken off.   If the pump alarms go off prior to the pump reading "Low Volume" then call [phone] and someone can assist you.  If the plunger comes out and the chemotherapy medication is leaking out, please use your home chemo spill kit to clean up the spill. Do NOT use paper towels or other household products.  If you have problems or questions regarding your pump, please call either [phone] (24 hours a day) or the cancer center Monday-Friday 8:00 a.m.- 4:30 p.m. at the clinic number and we will assist you. If you are unable to get assistance, then go to the nearest Emergency Department and ask the staff to contact the IV team for assistance.    [facility]  Discharge Instructions: Thank you for choosing [facility] to provide your oncology and hematology care.   If you have a lab appointment with the [facility], please go directly to the [facility] and check in at the registration area.   Wear comfortable clothing and clothing appropriate for easy access to any Portacath or PICC line.   We strive to give you quality time with your provider. You may need to reschedule your appointment if you arrive late (15 or more minutes).  Arriving late affects you and other patients whose appointments are after yours.  Also, if you miss three or more appointments without notifying the office, you may be dismissed from the clinic at the provider's discretion.      For prescription  refill requests, have your pharmacy contact our office and allow 72 hours for refills to be completed.    Today you received the following chemotherapy and/or immunotherapy agents oxaliplatin, leucovorin, fluorouracil   To help prevent nausea and vomiting after your treatment, we encourage you to take your nausea medication as directed.  BELOW ARE SYMPTOMS THAT SHOULD BE REPORTED IMMEDIATELY: *FEVER GREATER THAN 100.4 F (38 C) OR HIGHER *CHILLS OR SWEATING *NAUSEA AND VOMITING THAT IS NOT CONTROLLED WITH YOUR NAUSEA MEDICATION *UNUSUAL SHORTNESS OF BREATH *UNUSUAL BRUISING OR BLEEDING *URINARY PROBLEMS (pain or burning when urinating, or frequent urination) *BOWEL PROBLEMS (unusual diarrhea, constipation, pain near the anus) TENDERNESS IN MOUTH AND THROAT WITH OR WITHOUT PRESENCE OF ULCERS (sore throat, sores in mouth, or a toothache) UNUSUAL RASH, SWELLING OR PAIN  UNUSUAL VAGINAL DISCHARGE OR ITCHING   Items with * indicate a potential emergency and should be followed up as soon as possible or go to the Emergency Department if any problems should occur.  Please show the CHEMOTHERAPY ALERT CARD or IMMUNOTHERAPY ALERT CARD at check-in to the Emergency Department and triage nurse.  Should you have questions after your visit or need to cancel or reschedule your appointment, please contact [facility]  Dept: [phone]  and follow the prompts.  Office hours are 8:00 a.m. to 4:30 p.m. Monday - Friday. Please note that voicemails left  after 4:00 p.m. may not be returned until the following business day.  We are closed weekends and major holidays. You have access to a nurse at all times for urgent questions. Please call the main number to the clinic Dept: [phone] and follow the prompts.   For any non-urgent questions, you may also contact your provider using MyChart. We now offer e-Visits for anyone 85 and older to request care online for non-urgent symptoms.  For details visit [email or web address].   Also download the MyChart app! Go to the app store, search "MyChart", open the app, select [facility], and log in with your MyChart username and password.  Due to Covid, a mask is required upon entering the hospital/clinic. If you do not have a mask, one will be given to you upon arrival. For doctor visits, patients may have 1 support person aged 6 or older with them. For treatment visits, patients cannot have anyone with them due to current Covid guidelines and our immunocompromised population.   Oxaliplatin Injection What is this medication? OXALIPLATIN (ox AL i PLA tin) is a chemotherapy drug. It targets fast dividing cells, like cancer cells, and causes these cells to die. This medicine is usedto treat cancers of the colon and rectum, and many other cancers. This medicine may be used for other purposes; ask your health care provider orpharmacist if you have questions. COMMON BRAND NAME(S): Eloxatin What should I tell my care team before I take this medication? They need to know if you have any of these conditions: heart disease history of irregular heartbeat liver disease low blood counts, like white cells, platelets, or red blood cells lung or breathing disease, like asthma take medicines that treat or prevent blood clots tingling of the fingers or toes, or other nerve disorder an unusual or allergic reaction to oxaliplatin, other chemotherapy, other medicines, foods, dyes, or preservatives pregnant or trying to get pregnant breast-feeding How should I use this medication? This drug is given as an infusion into a vein. It is administered in a hospitalor clinic by a specially trained health care professional. Talk to your pediatrician regarding the use of this medicine in children.Special care may be needed. Overdosage: If you think you have taken too much of this medicine contact apoison control center or emergency room at once. NOTE: This  medicine is only for you. Do not share this medicine with others. What if I miss a dose? It is important not to miss a dose. Call your doctor or health careprofessional if you are unable to keep an appointment. What may interact with this medication? Do not take this medicine with any of the following medications: cisapride dronedarone pimozide thioridazine This medicine may also interact with the following medications: aspirin and aspirin-like medicines certain medicines that treat or prevent blood clots like warfarin, apixaban, dabigatran, and rivaroxaban cisplatin cyclosporine diuretics medicines for infection like acyclovir, adefovir, amphotericin B, bacitracin, cidofovir, foscarnet, ganciclovir, gentamicin, pentamidine, vancomycin NSAIDs, medicines for pain and inflammation, like ibuprofen or naproxen other medicines that prolong the QT interval (an abnormal heart rhythm) pamidronate zoledronic acid This list may not describe all possible interactions. Give your health care provider a list of all the medicines, herbs, non-prescription drugs, or dietary supplements you use. Also tell them if you smoke, drink alcohol, or use illegaldrugs. Some items may interact with your medicine. What should I watch for while using this medication? Your condition will be monitored carefully while you are receiving thismedicine. You may need blood work done while you  are taking this medicine. This medicine may make you feel generally unwell. This is not uncommon as chemotherapy can affect healthy cells as well as cancer cells. Report any side effects. Continue your course of treatment even though you feel ill unless yourhealthcare professional tells you to stop. This medicine can make you more sensitive to cold. Do not drink cold drinks or use ice. Cover exposed skin before coming in contact with cold temperatures or cold objects. When out in cold weather wear warm clothing and cover your mouth and nose  to warm the air that goes into your lungs. Tell your doctor if you getsensitive to the cold. Do not become pregnant while taking this medicine or for 9 months after stopping it. Women should inform their health care professional if they wish to become pregnant or think they might be pregnant. Men should not father a child while taking this medicine and for 6 months after stopping it. There is potential for serious side effects to an unborn child. Talk to your health careprofessional for more information. Do not breast-feed a child while taking this medicine or for 3 months afterstopping it. This medicine has caused ovarian failure in some women. This medicine may make it more difficult to get pregnant. Talk to your health care professional if [place] concerned about your fertility. This medicine has caused decreased sperm counts in some men. This may make it more difficult to father a child. Talk to your health care professional if [place] concerned about your fertility. This medicine may increase your risk of getting an infection. Call your health care professional for advice if you get a fever, chills, or sore throat, or other symptoms of a cold or flu. Do not treat yourself. Try to avoid beingaround people who are sick. Avoid taking medicines that contain aspirin, acetaminophen, ibuprofen, naproxen, or ketoprofen unless instructed by your health care professional.These medicines may hide a fever. Be careful brushing or flossing your teeth or using a toothpick because you may get an infection or bleed more easily. If you have any dental work done, Primary school teacher you are receiving this medicine. What side effects may I notice from receiving this medication? Side effects that you should report to your doctor or health care professionalas soon as possible: allergic reactions like skin rash, itching or hives, swelling of the face, lips, or tongue breathing problems cough low blood counts - this medicine  may decrease the number of white blood cells, red blood cells, and platelets. You may be at increased risk for infections and bleeding nausea, vomiting pain, redness, or irritation at site where injected pain, tingling, numbness in the hands or feet signs and symptoms of bleeding such as bloody or black, tarry stools; red or dark brown urine; spitting up blood or brown material that looks like coffee grounds; red spots on the skin; unusual bruising or bleeding from the eyes, gums, or nose signs and symptoms of a dangerous change in heartbeat or heart rhythm like chest pain; dizziness; fast, irregular heartbeat; palpitations; feeling faint or lightheaded; falls signs and symptoms of infection like fever; chills; cough; sore throat; pain or trouble passing urine signs and symptoms of liver injury like dark yellow or brown urine; general ill feeling or flu-like symptoms; light-colored stools; loss of appetite; nausea; right upper belly pain; unusually weak or tired; yellowing of the eyes or skin signs and symptoms of low red blood cells or anemia such as unusually weak or tired; feeling faint or lightheaded; falls signs and  symptoms of muscle injury like dark urine; trouble passing urine or change in the amount of urine; unusually weak or tired; muscle pain; back pain Side effects that usually do not require medical attention (report to yourdoctor or health care professional if they continue or are bothersome): changes in taste diarrhea gas hair loss loss of appetite mouth sores This list may not describe all possible side effects. Call your doctor for medical advice about side effects. You may report side effects to FDA at1-800-FDA-1088. Where should I keep my medication? This drug is given in a hospital or clinic and will not be stored at home. NOTE: This sheet is a summary. It may not cover all possible information. If you have questions about this medicine, talk to your doctor, pharmacist,  orhealth care provider.  2022 Elsevier/Gold Standard (2019-03-23 12:20:35)  Leucovorin injection What is this medication? LEUCOVORIN (loo koe VOR in) is used to prevent or treat the harmful effects of some medicines. This medicine is used to treat anemia caused by a low amount of folic acid in the body. It is also used with 5-fluorouracil (5-FU) to treatcolon cancer. This medicine may be used for other purposes; ask your health care provider orpharmacist if you have questions. What should I tell my care team before I take this medication? They need to know if you have any of these conditions: anemia from low levels of vitamin B-12 in the blood an unusual or allergic reaction to leucovorin, folic acid, other medicines, foods, dyes, or preservatives pregnant or trying to get pregnant breast-feeding How should I use this medication? This medicine is for injection into a muscle or into a vein. It is given by ahealth care professional in a hospital or clinic setting. Talk to your pediatrician regarding the use of this medicine in children.Special care may be needed. Overdosage: If you think you have taken too much of this medicine contact apoison control center or emergency room at once. NOTE: This medicine is only for you. Do not share this medicine with others. What if I miss a dose? This does not apply. What may interact with this medication? capecitabine fluorouracil phenobarbital phenytoin primidone trimethoprim-sulfamethoxazole This list may not describe all possible interactions. Give your health care provider a list of all the medicines, herbs, non-prescription drugs, or dietary supplements you use. Also tell them if you smoke, drink alcohol, or use illegaldrugs. Some items may interact with your medicine. What should I watch for while using this medication? Your condition will be monitored carefully while you are receiving thismedicine. This medicine may increase the side effects  of 5-fluorouracil, 5-FU. Tell your doctor or health care professional if you have diarrhea or mouth sores that donot get better or that get worse. What side effects may I notice from receiving this medication? Side effects that you should report to your doctor or health care professionalas soon as possible: allergic reactions like skin rash, itching or hives, swelling of the face, lips, or tongue breathing problems fever, infection mouth sores unusual bleeding or bruising unusually weak or tired Side effects that usually do not require medical attention (report to yourdoctor or health care professional if they continue or are bothersome): constipation or diarrhea loss of appetite nausea, vomiting This list may not describe all possible side effects. Call your doctor for medical advice about side effects. You may report side effects to FDA at1-800-FDA-1088. Where should I keep my medication? This drug is given in a hospital or clinic and will not be stored  at home. NOTE: This sheet is a summary. It may not cover all possible information. If you have questions about this medicine, talk to your doctor, pharmacist, orhealth care provider.  2022 Elsevier/Gold Standard (2008-05-09 16:50:29)  Fluorouracil, 5-FU injection What is this medication? FLUOROURACIL, 5-FU (flure oh YOOR a sil) is a chemotherapy drug. It slows the growth of cancer cells. This medicine is used to treat many types of cancer like breast cancer, colon or rectal cancer, pancreatic cancer, and stomachcancer. This medicine may be used for other purposes; ask your health care provider orpharmacist if you have questions. COMMON BRAND NAME(S): Adrucil What should I tell my care team before I take this medication? They need to know if you have any of these conditions: blood disorders dihydropyrimidine dehydrogenase (DPD) deficiency infection (especially a virus infection such as chickenpox, cold sores, or herpes) kidney  disease liver disease malnourished, poor nutrition recent or ongoing radiation therapy an unusual or allergic reaction to fluorouracil, other chemotherapy, other medicines, foods, dyes, or preservatives pregnant or trying to get pregnant breast-feeding How should I use this medication? This drug is given as an infusion or injection into a vein. It is administeredin a hospital or clinic by a specially trained health care professional. Talk to your pediatrician regarding the use of this medicine in children.Special care may be needed. Overdosage: If you think you have taken too much of this medicine contact apoison control center or emergency room at once. NOTE: This medicine is only for you. Do not share this medicine with others. What if I miss a dose? It is important not to miss your dose. Call your doctor or health careprofessional if you are unable to keep an appointment. What may interact with this medication? Do not take this medicine with any of the following medications: live virus vaccines This medicine may also interact with the following medications: medicines that treat or prevent blood clots like warfarin, enoxaparin, and dalteparin This list may not describe all possible interactions. Give your health care provider a list of all the medicines, herbs, non-prescription drugs, or dietary supplements you use. Also tell them if you smoke, drink alcohol, or use illegaldrugs. Some items may interact with your medicine. What should I watch for while using this medication? Visit your doctor for checks on your progress. This drug may make you feel generally unwell. This is not uncommon, as chemotherapy can affect healthy cells as well as cancer cells. Report any side effects. Continue your course oftreatment even though you feel ill unless your doctor tells you to stop. In some cases, you may be given additional medicines to help with side effects.Follow all directions for their use. Call  your doctor or health care professional for advice if you get a fever, chills or sore throat, or other symptoms of a cold or flu. Do not treat yourself. This drug decreases your body's ability to fight infections. Try toavoid being around people who are sick. This medicine may increase your risk to bruise or bleed. Call your doctor orhealth care professional if you notice any unusual bleeding. Be careful brushing and flossing your teeth or using a toothpick because you may get an infection or bleed more easily. If you have any dental work done,tell your dentist you are receiving this medicine. Avoid taking products that contain aspirin, acetaminophen, ibuprofen, naproxen, or ketoprofen unless instructed by your doctor. These medicines may hide afever. Do not become pregnant while taking this medicine. Women should inform their doctor if they wish to  become pregnant or think they might be pregnant. There is a potential for serious side effects to an unborn child. Talk to your health care professional or pharmacist for more information. Do not breast-feed aninfant while taking this medicine. Men should inform their doctor if they wish to father a child. This medicinemay lower sperm counts. Do not treat diarrhea with over the counter products. Contact your doctor ifyou have diarrhea that lasts more than 2 days or if it is severe and watery. This medicine can make you more sensitive to the sun. Keep out of the sun. If you cannot avoid being in the sun, wear protective clothing and use sunscreen.Do not use sun lamps or tanning beds/booths. What side effects may I notice from receiving this medication? Side effects that you should report to your doctor or health care professionalas soon as possible: allergic reactions like skin rash, itching or hives, swelling of the face, lips, or tongue low blood counts - this medicine may decrease the number of white blood cells, red blood cells and platelets. You may be at  increased risk for infections and bleeding. signs of infection - fever or chills, cough, sore throat, pain or difficulty passing urine signs of decreased platelets or bleeding - bruising, pinpoint red spots on the skin, black, tarry stools, blood in the urine signs of decreased red blood cells - unusually weak or tired, fainting spells, lightheadedness breathing problems changes in vision chest pain mouth sores nausea and vomiting pain, swelling, redness at site where injected pain, tingling, numbness in the hands or feet redness, swelling, or sores on hands or feet stomach pain unusual bleeding Side effects that usually do not require medical attention (report to yourdoctor or health care professional if they continue or are bothersome): changes in finger or toe nails diarrhea dry or itchy skin hair loss headache loss of appetite sensitivity of eyes to the light stomach upset unusually teary eyes This list may not describe all possible side effects. Call your doctor for medical advice about side effects. You may report side effects to FDA at1-800-FDA-1088. Where should I keep my medication? This drug is given in a hospital or clinic and will not be stored at home. NOTE: This sheet is a summary. It may not cover all possible information. If you have questions about this medicine, talk to your doctor, pharmacist, orhealth care provider.  2022 Elsevier/Gold Standard (2019-10-04 15:00:03)

## 2021-07-31 ENCOUNTER — Other Ambulatory Visit: Payer: Self-pay

## 2021-07-31 ENCOUNTER — Inpatient Hospital Stay: Payer: Medicare HMO

## 2021-07-31 VITALS — BP 115/40 | HR 75 | Temp 97.3°F | Resp 18

## 2021-07-31 DIAGNOSIS — T451X5A Adverse effect of antineoplastic and immunosuppressive drugs, initial encounter: Secondary | ICD-10-CM | POA: Diagnosis not present

## 2021-07-31 DIAGNOSIS — D6959 Other secondary thrombocytopenia: Secondary | ICD-10-CM | POA: Diagnosis not present

## 2021-07-31 DIAGNOSIS — C20 Malignant neoplasm of rectum: Secondary | ICD-10-CM | POA: Diagnosis not present

## 2021-07-31 DIAGNOSIS — Z5111 Encounter for antineoplastic chemotherapy: Secondary | ICD-10-CM | POA: Diagnosis not present

## 2021-07-31 DIAGNOSIS — D701 Agranulocytosis secondary to cancer chemotherapy: Secondary | ICD-10-CM | POA: Diagnosis not present

## 2021-07-31 DIAGNOSIS — C539 Malignant neoplasm of cervix uteri, unspecified: Secondary | ICD-10-CM | POA: Diagnosis not present

## 2021-07-31 MED ORDER — SODIUM CHLORIDE 0.9% FLUSH
3.0000 mL | INTRAVENOUS | Status: DC | PRN
  Administered 2021-07-31: 3 mL

## 2021-07-31 MED ORDER — PEGFILGRASTIM-CBQV 6 MG/0.6ML ~~LOC~~ SOSY
6.0000 mg | PREFILLED_SYRINGE | Freq: Once | SUBCUTANEOUS | Status: AC
  Administered 2021-07-31: 6 mg via SUBCUTANEOUS

## 2021-07-31 MED ORDER — HEPARIN SOD (PORK) LOCK FLUSH 100 UNIT/ML IV SOLN
500.0000 [IU] | Freq: Once | INTRAVENOUS | Status: AC | PRN
  Administered 2021-07-31: 500 [IU]

## 2021-07-31 NOTE — Patient Instructions (Signed)
[facility]  Discharge Instructions: Thank you for choosing [facility] to provide your oncology and hematology care.   If you have a lab appointment with the [facility], please go directly to the [facility] and check in at the registration area.   Wear comfortable clothing and clothing appropriate for easy access to any Portacath or PICC line.   We strive to give you quality time with your provider. You may need to reschedule your appointment if you arrive late (15 or more minutes).  Arriving late affects you and other patients whose appointments are after yours.  Also, if you miss three or more appointments without notifying the office, you may be dismissed from the clinic at the provider's discretion.      For prescription refill requests, have your pharmacy contact our office and allow 72 hours for refills to be completed.    Today you received the following chemotherapy and/or immunotherapy agents UDENCYA      To help prevent nausea and vomiting after your treatment, we encourage you to take your nausea medication as directed.  BELOW ARE SYMPTOMS THAT SHOULD BE REPORTED IMMEDIATELY: *FEVER GREATER THAN 100.4 F (38 C) OR HIGHER *CHILLS OR SWEATING *NAUSEA AND VOMITING THAT IS NOT CONTROLLED WITH YOUR NAUSEA MEDICATION *UNUSUAL SHORTNESS OF BREATH *UNUSUAL BRUISING OR BLEEDING *URINARY PROBLEMS (pain or burning when urinating, or frequent urination) *BOWEL PROBLEMS (unusual diarrhea, constipation, pain near the anus) TENDERNESS IN MOUTH AND THROAT WITH OR WITHOUT PRESENCE OF ULCERS (sore throat, sores in mouth, or a toothache) UNUSUAL RASH, SWELLING OR PAIN  UNUSUAL VAGINAL DISCHARGE OR ITCHING   Items with * indicate a potential emergency and should be followed up as soon as possible or go to the Emergency Department if any problems should occur.  Please show the CHEMOTHERAPY ALERT CARD or IMMUNOTHERAPY ALERT CARD at check-in to the  Emergency Department and triage nurse.  Should you have questions after your visit or need to cancel or reschedule your appointment, please contact [facility]  Dept: [phone]  and follow the prompts.  Office hours are 8:00 a.m. to 4:30 p.m. Monday - Friday. Please note that voicemails left after 4:00 p.m. may not be returned until the following business day.  We are closed weekends and major holidays. You have access to a nurse at all times for urgent questions. Please call the main number to the clinic Dept: [phone] and follow the prompts.   For any non-urgent questions, you may also contact your provider using MyChart. We now offer e-Visits for anyone 20 and older to request care online for non-urgent symptoms. For details visit [email or web address].   Also download the MyChart app! Go to the app store, search "MyChart", open the app, select [facility], and log in with your MyChart username and password.  Due to Covid, a mask is required upon entering the hospital/clinic. If you do not have a mask, one will be given to you upon arrival. For doctor visits, patients may have 1 support person aged 6 or older with them. For treatment visits, patients cannot have anyone with them due to current Covid guidelines and our immunocompromised population.   Pegfilgrastim injection What is this medication? PEGFILGRASTIM (PEG fil gra stim) is a long-acting granulocyte colony-stimulating factor that stimulates the growth of neutrophils, a type of white blood cell important in the body's fight against infection. It is used to reduce the incidence of fever and infection in patients  with certain types of cancer who are receiving chemotherapy that affects the bone marrow, and to increase survival after being exposed to high doses of radiation. This medicine may be used for other purposes; ask your health care provider or pharmacist if you have questions. COMMON BRAND NAME(S):  Rexene Edison, Ziextenzo What should I tell my care team before I take this medication? They need to know if you have any of these conditions: kidney disease latex allergy ongoing radiation therapy sickle cell disease skin reactions to acrylic adhesives (On-Body Injector only) an unusual or allergic reaction to pegfilgrastim, filgrastim, other medicines, foods, dyes, or preservatives pregnant or trying to get pregnant breast-feeding How should I use this medication? This medicine is for injection under the skin. If you get this medicine at home, you will be taught how to prepare and give the pre-filled syringe or how to use the On-body Injector. Refer to the patient Instructions for Use for detailed instructions. Use exactly as directed. Tell your healthcare provider immediately if you suspect that the On-body Injector may not have performed as intended or if you suspect the use of the On-body Injector resulted in a missed or partial dose. It is important that you put your used needles and syringes in a special sharps container. Do not put them in a trash can. If you do not have a sharps container, call your pharmacist or healthcare provider to get one. Talk to your pediatrician regarding the use of this medicine in children. While this drug may be prescribed for selected conditions, precautions do apply. Overdosage: If you think you have taken too much of this medicine contact a poison control center or emergency room at once. NOTE: This medicine is only for you. Do not share this medicine with others. What if I miss a dose? It is important not to miss your dose. Call your doctor or health care professional if you miss your dose. If you miss a dose due to an On-body Injector failure or leakage, a new dose should be administered as soon as possible using a single prefilled syringe for manual use. What may interact with this medication? Interactions have not been  studied. This list may not describe all possible interactions. Give your health care provider a list of all the medicines, herbs, non-prescription drugs, or dietary supplements you use. Also tell them if you smoke, drink alcohol, or use illegal drugs. Some items may interact with your medicine. What should I watch for while using this medication? Your condition will be monitored carefully while you are receiving this medicine. You may need blood work done while you are taking this medicine. Talk to your health care provider about your risk of cancer. You may be more at risk for certain types of cancer if you take this medicine. If you are going to need a MRI, CT scan, or other procedure, tell your doctor that you are using this medicine (On-Body Injector only). What side effects may I notice from receiving this medication? Side effects that you should report to your doctor or health care professional as soon as possible: allergic reactions (skin rash, itching or hives, swelling of the face, lips, or tongue) back pain dizziness fever pain, redness, or irritation at site where injected pinpoint red spots on the skin red or dark-brown urine shortness of breath or breathing problems stomach or side pain, or pain at the shoulder swelling tiredness trouble passing urine or change in the amount of urine unusual bruising or bleeding  Side effects that usually do not require medical attention (report to your doctor or health care professional if they continue or are bothersome): bone pain muscle pain This list may not describe all possible side effects. Call your doctor for medical advice about side effects. You may report side effects to FDA at 1-800-FDA-1088. Where should I keep my medication? Keep out of the reach of children. If you are using this medicine at home, you will be instructed on how to store it. Throw away any unused medicine after the expiration date on the label. NOTE: This sheet  is a summary. It may not cover all possible information. If you have questions about this medicine, talk to your doctor, pharmacist, or health care provider.  2022 Elsevier/Gold Standard (2020-11-30 11:54:14)

## 2021-08-07 DIAGNOSIS — E039 Hypothyroidism, unspecified: Secondary | ICD-10-CM | POA: Diagnosis not present

## 2021-08-07 DIAGNOSIS — M545 Low back pain, unspecified: Secondary | ICD-10-CM | POA: Diagnosis not present

## 2021-08-07 DIAGNOSIS — Z79891 Long term (current) use of opiate analgesic: Secondary | ICD-10-CM | POA: Diagnosis not present

## 2021-08-07 DIAGNOSIS — E089 Diabetes mellitus due to underlying condition without complications: Secondary | ICD-10-CM | POA: Diagnosis not present

## 2021-08-07 DIAGNOSIS — R69 Illness, unspecified: Secondary | ICD-10-CM | POA: Diagnosis not present

## 2021-08-07 DIAGNOSIS — G894 Chronic pain syndrome: Secondary | ICD-10-CM | POA: Diagnosis not present

## 2021-08-07 DIAGNOSIS — M48061 Spinal stenosis, lumbar region without neurogenic claudication: Secondary | ICD-10-CM | POA: Diagnosis not present

## 2021-08-07 DIAGNOSIS — M791 Myalgia, unspecified site: Secondary | ICD-10-CM | POA: Diagnosis not present

## 2021-08-07 DIAGNOSIS — M5417 Radiculopathy, lumbosacral region: Secondary | ICD-10-CM | POA: Diagnosis not present

## 2021-08-08 DIAGNOSIS — C20 Malignant neoplasm of rectum: Secondary | ICD-10-CM | POA: Diagnosis not present

## 2021-08-11 ENCOUNTER — Other Ambulatory Visit: Payer: Self-pay | Admitting: Oncology

## 2021-08-12 ENCOUNTER — Other Ambulatory Visit: Payer: Self-pay

## 2021-08-12 ENCOUNTER — Inpatient Hospital Stay: Payer: Medicare HMO

## 2021-08-12 ENCOUNTER — Other Ambulatory Visit: Payer: Self-pay | Admitting: Nurse Practitioner

## 2021-08-12 ENCOUNTER — Inpatient Hospital Stay: Payer: Medicare HMO | Admitting: Nurse Practitioner

## 2021-08-12 ENCOUNTER — Encounter: Payer: Self-pay | Admitting: Nurse Practitioner

## 2021-08-12 VITALS — BP 157/55 | HR 76 | Temp 97.2°F | Resp 16 | Wt 146.0 lb

## 2021-08-12 DIAGNOSIS — C20 Malignant neoplasm of rectum: Secondary | ICD-10-CM

## 2021-08-12 DIAGNOSIS — T451X5A Adverse effect of antineoplastic and immunosuppressive drugs, initial encounter: Secondary | ICD-10-CM | POA: Diagnosis not present

## 2021-08-12 DIAGNOSIS — D6959 Other secondary thrombocytopenia: Secondary | ICD-10-CM | POA: Diagnosis not present

## 2021-08-12 DIAGNOSIS — Z5111 Encounter for antineoplastic chemotherapy: Secondary | ICD-10-CM | POA: Diagnosis not present

## 2021-08-12 DIAGNOSIS — D701 Agranulocytosis secondary to cancer chemotherapy: Secondary | ICD-10-CM | POA: Diagnosis not present

## 2021-08-12 DIAGNOSIS — C539 Malignant neoplasm of cervix uteri, unspecified: Secondary | ICD-10-CM | POA: Diagnosis not present

## 2021-08-12 DIAGNOSIS — R519 Headache, unspecified: Secondary | ICD-10-CM

## 2021-08-12 LAB — CMP (CANCER CENTER ONLY)
ALT: 7 U/L (ref 0–44)
AST: 12 U/L — ABNORMAL LOW (ref 15–41)
Albumin: 3.5 g/dL (ref 3.5–5.0)
Alkaline Phosphatase: 155 U/L — ABNORMAL HIGH (ref 38–126)
Anion gap: 16 — ABNORMAL HIGH (ref 5–15)
BUN: 13 mg/dL (ref 8–23)
CO2: 24 mmol/L (ref 22–32)
Calcium: 8.1 mg/dL — ABNORMAL LOW (ref 8.9–10.3)
Chloride: 101 mmol/L (ref 98–111)
Creatinine: 0.98 mg/dL (ref 0.44–1.00)
GFR, Estimated: >60 mL/min (ref >60)
Glucose, Bld: 180 mg/dL — ABNORMAL HIGH (ref 70–99)
Potassium: 3 mmol/L — ABNORMAL LOW (ref 3.5–5.1)
Sodium: 141 mmol/L (ref 135–145)
Total Bilirubin: 0.5 mg/dL (ref 0.3–1.2)
Total Protein: 6.1 g/dL — ABNORMAL LOW (ref 6.5–8.1)

## 2021-08-12 LAB — CBC WITH DIFFERENTIAL (CANCER CENTER ONLY)
Abs Immature Granulocytes: 0.77 10*3/uL — ABNORMAL HIGH (ref 0.00–0.07)
Basophils Absolute: 0 10*3/uL (ref 0.0–0.1)
Basophils Relative: 0 %
Eosinophils Absolute: 0.1 10*3/uL (ref 0.0–0.5)
Eosinophils Relative: 1 %
HCT: 31.8 % — ABNORMAL LOW (ref 36.0–46.0)
Hemoglobin: 10 g/dL — ABNORMAL LOW (ref 12.0–15.0)
Immature Granulocytes: 7 %
Lymphocytes Relative: 11 %
Lymphs Abs: 1.3 10*3/uL (ref 0.7–4.0)
MCH: 27.4 pg (ref 26.0–34.0)
MCHC: 31.4 g/dL (ref 30.0–36.0)
MCV: 87.1 fL (ref 80.0–100.0)
Monocytes Absolute: 1 10*3/uL (ref 0.1–1.0)
Monocytes Relative: 9 %
Neutro Abs: 8.7 10*3/uL — ABNORMAL HIGH (ref 1.7–7.7)
Neutrophils Relative %: 72 %
Platelet Count: 142 10*3/uL — ABNORMAL LOW (ref 150–400)
RBC: 3.65 MIL/uL — ABNORMAL LOW (ref 3.87–5.11)
RDW: 20.3 % — ABNORMAL HIGH (ref 11.5–15.5)
Smear Review: NORMAL
WBC Count: 11.9 10*3/uL — ABNORMAL HIGH (ref 4.0–10.5)
nRBC: 0.3 % — ABNORMAL HIGH (ref 0.0–0.2)

## 2021-08-12 MED ORDER — PALONOSETRON HCL INJECTION 0.25 MG/5ML
0.2500 mg | Freq: Once | INTRAVENOUS | Status: AC
  Administered 2021-08-12: 0.25 mg via INTRAVENOUS
  Filled 2021-08-12: qty 5

## 2021-08-12 MED ORDER — OXALIPLATIN CHEMO INJECTION 100 MG/20ML
55.0000 mg/m2 | Freq: Once | INTRAVENOUS | Status: AC
  Administered 2021-08-12: 90 mg via INTRAVENOUS
  Filled 2021-08-12: qty 18

## 2021-08-12 MED ORDER — FLUOROURACIL CHEMO INJECTION 2.5 GM/50ML
400.0000 mg/m2 | Freq: Once | INTRAVENOUS | Status: AC
  Administered 2021-08-12: 650 mg via INTRAVENOUS
  Filled 2021-08-12: qty 13

## 2021-08-12 MED ORDER — LEUCOVORIN CALCIUM INJECTION 350 MG
400.0000 mg/m2 | Freq: Once | INTRAVENOUS | Status: AC
  Administered 2021-08-12: 672 mg via INTRAVENOUS
  Filled 2021-08-12: qty 33.6

## 2021-08-12 MED ORDER — SODIUM CHLORIDE 0.9 % IV SOLN
10.0000 mg | Freq: Once | INTRAVENOUS | Status: AC
  Administered 2021-08-12: 10 mg via INTRAVENOUS
  Filled 2021-08-12: qty 1

## 2021-08-12 MED ORDER — POTASSIUM CHLORIDE CRYS ER 20 MEQ PO TBCR: 20.0000 meq | EXTENDED_RELEASE_TABLET | Freq: Every day | ORAL | 1 refills | Status: DC

## 2021-08-12 MED ORDER — SODIUM CHLORIDE 0.9 % IV SOLN
2000.0000 mg/m2 | INTRAVENOUS | Status: DC
  Administered 2021-08-12: 3350 mg via INTRAVENOUS
  Filled 2021-08-12: qty 67

## 2021-08-12 MED ORDER — ACETAMINOPHEN 325 MG PO TABS
650.0000 mg | ORAL_TABLET | Freq: Once | ORAL | Status: AC
  Administered 2021-08-12: 650 mg via ORAL
  Filled 2021-08-12: qty 2

## 2021-08-12 MED ORDER — DEXTROSE 5 % IV SOLN: Freq: Once | INTRAVENOUS | Status: AC

## 2021-08-12 NOTE — Progress Notes (Signed)
[facility] OFFICE PROGRESS NOTE   Diagnosis: Rectal cancer, cervical cancer  INTERVAL HISTORY:   [name] returns as scheduled.  She completed cycle 2 FOLFOX 07/29/2021.  She woke up with a headache this morning.  She took some Tylenol.  She denies nausea/vomiting.  No mouth sores.  No diarrhea.  Stable neuropathy symptoms.  No achy pain following white cell growth factor support.  Objective:  Vital signs in last 24 hours:  Blood pressure (!) 157/55, pulse 76, temperature (!) 97.2 F (36.2 C), temperature source Temporal, resp. rate 16, weight 146 lb (66.2 kg), SpO2 98 %.    HEENT: Mild white coating over tongue.  No buccal thrush.  Healing superficial abrasion tip of the nose. Resp: Lungs clear bilaterally. Cardio: Regular rate and rhythm. GI: No hepatomegaly.  Left lower quadrant colostomy.  Brown stool in collection bag. Vascular: Trace edema right greater than left lower leg. Neuro: Mild to moderate decrease in vibratory sense over the fingertips bilaterally. Skin: Mild pretibial erythema bilaterally. Port-A-Cath without erythema   Lab Results:  Lab Results  Component Value Date   WBC 11.9 (H) 08/12/2021   HGB 10.0 (L) 08/12/2021   HCT 31.8 (L) 08/12/2021   MCV 87.1 08/12/2021   PLT 142 (L) 08/12/2021   NEUTROABS PENDING 08/12/2021    Imaging:  No results found.  Medications: I have reviewed the patient's current medications.  Assessment/Plan: Rectal cancer Partially obstructing mass beginning at 10 cm from the anal verge on colonoscopy 01/23/2021, biopsy-adenocarcinoma, immunohistochemical stains at [facility]-CK20, CDX-2, and SATB2 positive, patchy strong staining for p16.  The rectal tumor has a distinct immunohistochemical profile suggesting a primary colorectal tumor as opposed to metastatic cervical cancer. CT abdomen/pelvis 01/18/2021-bilateral hydronephrosis, endometrial fluid collection, prominent stool in the colon MRI pelvis 01/23/2021-tumor at  7.6 cm from the anal verge, abnormal signal bridge in the cervix, mesorectum, and anterior aspect of the rectum-similar to MRI from 2020, MRI stage could not be defined secondary to posttreatment changes in the pelvis 03/18/2021-total abdominal hysterectomy, cystoscopy with insertion of ureteral stents, exploratory laparotomy, aborted low anterior resection, descending loop colostomy--right and left fallopian tubes and ovaries negative for malignancy; excision portion of cervix positive for adenocarcinoma; uterus with invasive moderately differentiated adenocarcinoma of the cervix, HPV associated, invading through full-thickness of the cervix/lower uterine segment and into parametrial tissue, margins of resection received disrupted, cannot be evaluated; remaining uterus with extensive endocervical adenocarcinoma in situ involving lower uterine segment and replacing endometrium.  This carcinoma was felt to be a separate primary from the rectal tumor. PET scan at [facility] 04/11/2021-intense FDG activity in region of known rectal mass.  Hypermetabolic left external iliac and right inguinal lymph nodes.  Minimal FDG uptake and small right supraclavicular lymph node.  No uptake seen in the cervix. 04/23/2021 FNA right inguinal lymph node-negative for malignancy Evaluated by Dr. Leamon Arnt 05/21/2021, 05/28/2021-appears to have 2 synchronous malignancies (cervical adenocarcinoma and rectal adenocarcinoma); further surgery which would entail pelvic exenteration not recommended by gynecologic oncology or colorectal surgery.  Full doses of radiotherapy not felt to be feasible.  Recommended 3 months of CAPOX and then repeat MRI of abdomen/pelvis, chest CT and colonoscopy. Cycle 1 Xeloda 06/13/2021 Cycle 1 FOLFOX 07/08/2021, oxaliplatin dose reduced secondary to pre-existing neuropathy Cycle 2 FOLFOX 07/29/2021, Udenyca Cycle 3 FOLFOX 08/12/2021, Udenyca     Bilateral hydronephrosis-secondary to urinary retention? Hematometra found on  pelvic MRI 01/23/2021, evaluated by gynecologic oncology Cervical cancer November 1994, stage Ib, treated with external beam  radiation and brachytherapy at [facility] Diabetes Neuropathy Chronic pain secondary to #6 Recurrent urinary tract infections History of a CVA History of hyperthyroidism G3, P3 COVID-19 infection January 2021    Disposition: [name] appears stable.  She has completed 1 cycle of Xeloda and 2 cycles of FOLFOX.  Overall she seems to be tolerating chemotherapy fairly well.  Plan to proceed with cycle 3 FOLFOX today as scheduled pending adequate labs.  She will return for lab, follow-up, FOLFOX in 2 weeks.  We are available to see her sooner if needed.    Ned Card ANP/GNP-BC   08/12/2021  8:59 AM

## 2021-08-12 NOTE — Patient Instructions (Addendum)
[place]   Discharge Instructions: Thank you for choosing [facility] to provide your oncology and hematology care.   If you have a lab appointment with the [facility], please go directly to the [facility] and check in at the registration area.   Wear comfortable clothing and clothing appropriate for easy access to any Portacath or PICC line.   We strive to give you quality time with your provider. You may need to reschedule your appointment if you arrive late (15 or more minutes).  Arriving late affects you and other patients whose appointments are after yours.  Also, if you miss three or more appointments without notifying the office, you may be dismissed from the clinic at the provider's discretion.      For prescription refill requests, have your pharmacy contact our office and allow 72 hours for refills to be completed.    Today you received the following chemotherapy and/or immunotherapy agents Oxaliplatin (ELOXATIN), Leucovorin & Flourouracil (ADRUCIL).      To help prevent nausea and vomiting after your treatment, we encourage you to take your nausea medication as directed.  BELOW ARE SYMPTOMS THAT SHOULD BE REPORTED IMMEDIATELY: *FEVER GREATER THAN 100.4 F (38 C) OR HIGHER *CHILLS OR SWEATING *NAUSEA AND VOMITING THAT IS NOT CONTROLLED WITH YOUR NAUSEA MEDICATION *UNUSUAL SHORTNESS OF BREATH *UNUSUAL BRUISING OR BLEEDING *URINARY PROBLEMS (pain or burning when urinating, or frequent urination) *BOWEL PROBLEMS (unusual diarrhea, constipation, pain near the anus) TENDERNESS IN MOUTH AND THROAT WITH OR WITHOUT PRESENCE OF ULCERS (sore throat, sores in mouth, or a toothache) UNUSUAL RASH, SWELLING OR PAIN  UNUSUAL VAGINAL DISCHARGE OR ITCHING   Items with * indicate a potential emergency and should be followed up as soon as possible or go to the Emergency Department if any problems should occur.  Please show the CHEMOTHERAPY ALERT  CARD or IMMUNOTHERAPY ALERT CARD at check-in to the Emergency Department and triage nurse.  Should you have questions after your visit or need to cancel or reschedule your appointment, please contact [facility]  Dept: [phone]  and follow the prompts.  Office hours are 8:00 a.m. to 4:30 p.m. Monday - Friday. Please note that voicemails left after 4:00 p.m. may not be returned until the following business day.  We are closed weekends and major holidays. You have access to a nurse at all times for urgent questions. Please call the main number to the clinic Dept: [phone] and follow the prompts.   For any non-urgent questions, you may also contact your provider using MyChart. We now offer e-Visits for anyone 34 and older to request care online for non-urgent symptoms. For details visit [email or web address].   Also download the MyChart app! Go to the app store, search "MyChart", open the app, select [facility], and log in with your MyChart username and password.  Due to Covid, a mask is required upon entering the hospital/clinic. If you do not have a mask, one will be given to you upon arrival. For doctor visits, patients may have 1 support person aged 67 or older with them. For treatment visits, patients cannot have anyone with them due to current Covid guidelines and our immunocompromised population.   Oxaliplatin Injection What is this medication? OXALIPLATIN (ox AL i PLA tin) is a chemotherapy drug. It targets fast dividing cells, like cancer cells, and causes these cells to die. This medicine is used to treat cancers of the colon and rectum, and  many other cancers. This medicine may be used for other purposes; ask your health care provider or pharmacist if you have questions. COMMON BRAND NAME(S): Eloxatin What should I tell my care team before I take this medication? They need to know if you have any of these conditions: heart disease history of irregular  heartbeat liver disease low blood counts, like white cells, platelets, or red blood cells lung or breathing disease, like asthma take medicines that treat or prevent blood clots tingling of the fingers or toes, or other nerve disorder an unusual or allergic reaction to oxaliplatin, other chemotherapy, other medicines, foods, dyes, or preservatives pregnant or trying to get pregnant breast-feeding How should I use this medication? This drug is given as an infusion into a vein. It is administered in a hospital or clinic by a specially trained health care professional. Talk to your pediatrician regarding the use of this medicine in children. Special care may be needed. Overdosage: If you think you have taken too much of this medicine contact a poison control center or emergency room at once. NOTE: This medicine is only for you. Do not share this medicine with others. What if I miss a dose? It is important not to miss a dose. Call your doctor or health care professional if you are unable to keep an appointment. What may interact with this medication? Do not take this medicine with any of the following medications: cisapride dronedarone pimozide thioridazine This medicine may also interact with the following medications: aspirin and aspirin-like medicines certain medicines that treat or prevent blood clots like warfarin, apixaban, dabigatran, and rivaroxaban cisplatin cyclosporine diuretics medicines for infection like acyclovir, adefovir, amphotericin B, bacitracin, cidofovir, foscarnet, ganciclovir, gentamicin, pentamidine, vancomycin NSAIDs, medicines for pain and inflammation, like ibuprofen or naproxen other medicines that prolong the QT interval (an abnormal heart rhythm) pamidronate zoledronic acid This list may not describe all possible interactions. Give your health care provider a list of all the medicines, herbs, non-prescription drugs, or dietary supplements you use. Also tell  them if you smoke, drink alcohol, or use illegal drugs. Some items may interact with your medicine. What should I watch for while using this medication? Your condition will be monitored carefully while you are receiving this medicine. You may need blood work done while you are taking this medicine. This medicine may make you feel generally unwell. This is not uncommon as chemotherapy can affect healthy cells as well as cancer cells. Report any side effects. Continue your course of treatment even though you feel ill unless your healthcare professional tells you to stop. This medicine can make you more sensitive to cold. Do not drink cold drinks or use ice. Cover exposed skin before coming in contact with cold temperatures or cold objects. When out in cold weather wear warm clothing and cover your mouth and nose to warm the air that goes into your lungs. Tell your doctor if you get sensitive to the cold. Do not become pregnant while taking this medicine or for 9 months after stopping it. Women should inform their health care professional if they wish to become pregnant or think they might be pregnant. Men should not father a child while taking this medicine and for 6 months after stopping it. There is potential for serious side effects to an unborn child. Talk to your health care professional for more information. Do not breast-feed a child while taking this medicine or for 3 months after stopping it. This medicine has caused ovarian failure in  some women. This medicine may make it more difficult to get pregnant. Talk to your health care professional if you are concerned about your fertility. This medicine has caused decreased sperm counts in some men. This may make it more difficult to father a child. Talk to your health care professional if you are concerned about your fertility. This medicine may increase your risk of getting an infection. Call your health care professional for advice if you get a fever,  chills, or sore throat, or other symptoms of a cold or flu. Do not treat yourself. Try to avoid being around people who are sick. Avoid taking medicines that contain aspirin, acetaminophen, ibuprofen, naproxen, or ketoprofen unless instructed by your health care professional. These medicines may hide a fever. Be careful brushing or flossing your teeth or using a toothpick because you may get an infection or bleed more easily. If you have any dental work done, tell your dentist you are receiving this medicine. What side effects may I notice from receiving this medication? Side effects that you should report to your doctor or health care professional as soon as possible: allergic reactions like skin rash, itching or hives, swelling of the face, lips, or tongue breathing problems cough low blood counts - this medicine may decrease the number of white blood cells, red blood cells, and platelets. You may be at increased risk for infections and bleeding nausea, vomiting pain, redness, or irritation at site where injected pain, tingling, numbness in the hands or feet signs and symptoms of bleeding such as bloody or black, tarry stools; red or dark brown urine; spitting up blood or brown material that looks like coffee grounds; red spots on the skin; unusual bruising or bleeding from the eyes, gums, or nose signs and symptoms of a dangerous change in heartbeat or heart rhythm like chest pain; dizziness; fast, irregular heartbeat; palpitations; feeling faint or lightheaded; falls signs and symptoms of infection like fever; chills; cough; sore throat; pain or trouble passing urine signs and symptoms of liver injury like dark yellow or brown urine; general ill feeling or flu-like symptoms; light-colored stools; loss of appetite; nausea; right upper belly pain; unusually weak or tired; yellowing of the eyes or skin signs and symptoms of low red blood cells or anemia such as unusually weak or tired; feeling faint  or lightheaded; falls signs and symptoms of muscle injury like dark urine; trouble passing urine or change in the amount of urine; unusually weak or tired; muscle pain; back pain Side effects that usually do not require medical attention (report to your doctor or health care professional if they continue or are bothersome): changes in taste diarrhea gas hair loss loss of appetite mouth sores This list may not describe all possible side effects. Call your doctor for medical advice about side effects. You may report side effects to FDA at 1-800-FDA-1088. Where should I keep my medication? This drug is given in a hospital or clinic and will not be stored at home. NOTE: This sheet is a summary. It may not cover all possible information. If you have questions about this medicine, talk to your doctor, pharmacist, or health care provider.  2022 Elsevier/Gold Standard (2019-03-23 12:20:35)  Leucovorin injection What is this medication? LEUCOVORIN (loo koe VOR in) is used to prevent or treat the harmful effects of some medicines. This medicine is used to treat anemia caused by a low amount of folic acid in the body. It is also used with 5-fluorouracil (5-FU) to treat colon  cancer. This medicine may be used for other purposes; ask your health care provider or pharmacist if you have questions. What should I tell my care team before I take this medication? They need to know if you have any of these conditions: anemia from low levels of vitamin B-12 in the blood an unusual or allergic reaction to leucovorin, folic acid, other medicines, foods, dyes, or preservatives pregnant or trying to get pregnant breast-feeding How should I use this medication? This medicine is for injection into a muscle or into a vein. It is given by a health care professional in a hospital or clinic setting. Talk to your pediatrician regarding the use of this medicine in children. Special care may be needed. Overdosage: If you  think you have taken too much of this medicine contact a poison control center or emergency room at once. NOTE: This medicine is only for you. Do not share this medicine with others. What if I miss a dose? This does not apply. What may interact with this medication? capecitabine fluorouracil phenobarbital phenytoin primidone trimethoprim-sulfamethoxazole This list may not describe all possible interactions. Give your health care provider a list of all the medicines, herbs, non-prescription drugs, or dietary supplements you use. Also tell them if you smoke, drink alcohol, or use illegal drugs. Some items may interact with your medicine. What should I watch for while using this medication? Your condition will be monitored carefully while you are receiving this medicine. This medicine may increase the side effects of 5-fluorouracil, 5-FU. Tell your doctor or health care professional if you have diarrhea or mouth sores that do not get better or that get worse. What side effects may I notice from receiving this medication? Side effects that you should report to your doctor or health care professional as soon as possible: allergic reactions like skin rash, itching or hives, swelling of the face, lips, or tongue breathing problems fever, infection mouth sores unusual bleeding or bruising unusually weak or tired Side effects that usually do not require medical attention (report to your doctor or health care professional if they continue or are bothersome): constipation or diarrhea loss of appetite nausea, vomiting This list may not describe all possible side effects. Call your doctor for medical advice about side effects. You may report side effects to FDA at 1-800-FDA-1088. Where should I keep my medication? This drug is given in a hospital or clinic and will not be stored at home. NOTE: This sheet is a summary. It may not cover all possible information. If you have questions about this  medicine, talk to your doctor, pharmacist, or health care provider.  2022 Elsevier/Gold Standard (2008-05-09 16:50:29)  Fluorouracil, 5-FU injection What is this medication? FLUOROURACIL, 5-FU (flure oh YOOR a sil) is a chemotherapy drug. It slows the growth of cancer cells. This medicine is used to treat many types of cancer like breast cancer, colon or rectal cancer, pancreatic cancer, and stomach cancer. This medicine may be used for other purposes; ask your health care provider or pharmacist if you have questions. COMMON BRAND NAME(S): Adrucil What should I tell my care team before I take this medication? They need to know if you have any of these conditions: blood disorders dihydropyrimidine dehydrogenase (DPD) deficiency infection (especially a virus infection such as chickenpox, cold sores, or herpes) kidney disease liver disease malnourished, poor nutrition recent or ongoing radiation therapy an unusual or allergic reaction to fluorouracil, other chemotherapy, other medicines, foods, dyes, or preservatives pregnant or trying to get pregnant  breast-feeding How should I use this medication? This drug is given as an infusion or injection into a vein. It is administered in a hospital or clinic by a specially trained health care professional. Talk to your pediatrician regarding the use of this medicine in children. Special care may be needed. Overdosage: If you think you have taken too much of this medicine contact a poison control center or emergency room at once. NOTE: This medicine is only for you. Do not share this medicine with others. What if I miss a dose? It is important not to miss your dose. Call your doctor or health care professional if you are unable to keep an appointment. What may interact with this medication? Do not take this medicine with any of the following medications: live virus vaccines This medicine may also interact with the following medications: medicines  that treat or prevent blood clots like warfarin, enoxaparin, and dalteparin This list may not describe all possible interactions. Give your health care provider a list of all the medicines, herbs, non-prescription drugs, or dietary supplements you use. Also tell them if you smoke, drink alcohol, or use illegal drugs. Some items may interact with your medicine. What should I watch for while using this medication? Visit your doctor for checks on your progress. This drug may make you feel generally unwell. This is not uncommon, as chemotherapy can affect healthy cells as well as cancer cells. Report any side effects. Continue your course of treatment even though you feel ill unless your doctor tells you to stop. In some cases, you may be given additional medicines to help with side effects. Follow all directions for their use. Call your doctor or health care professional for advice if you get a fever, chills or sore throat, or other symptoms of a cold or flu. Do not treat yourself. This drug decreases your body's ability to fight infections. Try to avoid being around people who are sick. This medicine may increase your risk to bruise or bleed. Call your doctor or health care professional if you notice any unusual bleeding. Be careful brushing and flossing your teeth or using a toothpick because you may get an infection or bleed more easily. If you have any dental work done, tell your dentist you are receiving this medicine. Avoid taking products that contain aspirin, acetaminophen, ibuprofen, naproxen, or ketoprofen unless instructed by your doctor. These medicines may hide a fever. Do not become pregnant while taking this medicine. Women should inform their doctor if they wish to become pregnant or think they might be pregnant. There is a potential for serious side effects to an unborn child. Talk to your health care professional or pharmacist for more information. Do not breast-feed an infant while taking  this medicine. Men should inform their doctor if they wish to father a child. This medicine may lower sperm counts. Do not treat diarrhea with over the counter products. Contact your doctor if you have diarrhea that lasts more than 2 days or if it is severe and watery. This medicine can make you more sensitive to the sun. Keep out of the sun. If you cannot avoid being in the sun, wear protective clothing and use sunscreen. Do not use sun lamps or tanning beds/booths. What side effects may I notice from receiving this medication? Side effects that you should report to your doctor or health care professional as soon as possible: allergic reactions like skin rash, itching or hives, swelling of the face, lips, or tongue low blood  counts - this medicine may decrease the number of white blood cells, red blood cells and platelets. You may be at increased risk for infections and bleeding. signs of infection - fever or chills, cough, sore throat, pain or difficulty passing urine signs of decreased platelets or bleeding - bruising, pinpoint red spots on the skin, black, tarry stools, blood in the urine signs of decreased red blood cells - unusually weak or tired, fainting spells, lightheadedness breathing problems changes in vision chest pain mouth sores nausea and vomiting pain, swelling, redness at site where injected pain, tingling, numbness in the hands or feet redness, swelling, or sores on hands or feet stomach pain unusual bleeding Side effects that usually do not require medical attention (report to your doctor or health care professional if they continue or are bothersome): changes in finger or toe nails diarrhea dry or itchy skin hair loss headache loss of appetite sensitivity of eyes to the light stomach upset unusually teary eyes This list may not describe all possible side effects. Call your doctor for medical advice about side effects. You may report side effects to FDA at  1-800-FDA-1088. Where should I keep my medication? This drug is given in a hospital or clinic and will not be stored at home. NOTE: This sheet is a summary. It may not cover all possible information. If you have questions about this medicine, talk to your doctor, pharmacist, or health care provider.  2022 Elsevier/Gold Standard (2019-10-04 15:00:03)  The chemotherapy medication bag should finish at 46 hours, 96 hours, or 7 days. For example, if your pump is scheduled for 46 hours and it was put on at 4:00 p.m., it should finish at 2:00 p.m. the day it is scheduled to come off regardless of your appointment time.     Estimated time to finish at 11:35 a.m. on Wednesday 08/14/2021.   If the display on your pump reads "Low Volume" and it is beeping, take the batteries out of the pump and come to the cancer center for it to be taken off.   If the pump alarms go off prior to the pump reading "Low Volume" then call [phone] and someone can assist you.  If the plunger comes out and the chemotherapy medication is leaking out, please use your home chemo spill kit to clean up the spill. Do NOT use paper towels or other household products.  If you have problems or questions regarding your pump, please call either [phone] (24 hours a day) or the cancer center Monday-Friday 8:00 a.m.- 4:30 p.m. at the clinic number and we will assist you. If you are unable to get assistance, then go to the nearest Emergency Department and ask the staff to contact the IV team for assistance.

## 2021-08-12 NOTE — Progress Notes (Signed)
Patient presents for treatment. RN assessment completed along with the following:  Labs/vitals reviewed - Yes, and within treatment parameters.  K+ 3.0. Rx for [place] 28mq daily sent in to patient's pharmacy. Weight within 10% of previous measurement - Yes, last weight 152 lbs. Current weight 146 lbs. Oncology Treatment Attestation completed for current therapy- Yes, on 07/01/2021 Informed consent completed and reflects current therapy/intent - Yes, on date 07/08/2021             Provider progress note reviewed - Yes, today's provider note was reviewed. Treatment/Antibody/Supportive plan reviewed - Yes, and there are no adjustments needed for today's treatment. S&H and other orders reviewed - Yes, and there are no additional orders identified. Previous treatment date reviewed - Yes, and the appropriate amount of time has elapsed between treatments.   Patient to proceed with treatment.

## 2021-08-12 NOTE — Progress Notes (Signed)
Patient seen by Ned Card NP today  Vitals are within treatment parameters.  Labs reviewed by Ned Card NP and are within treatment parameters.  Per physician team, patient is ready for treatment and there are NO modifications to the treatment plan.   Per provider not all labs returned but labs that are returned pt is an ok to tx

## 2021-08-14 ENCOUNTER — Inpatient Hospital Stay: Payer: Medicare HMO

## 2021-08-14 ENCOUNTER — Other Ambulatory Visit: Payer: Self-pay

## 2021-08-14 VITALS — BP 139/54 | HR 76 | Temp 98.5°F | Resp 18

## 2021-08-14 DIAGNOSIS — D6959 Other secondary thrombocytopenia: Secondary | ICD-10-CM | POA: Diagnosis not present

## 2021-08-14 DIAGNOSIS — C20 Malignant neoplasm of rectum: Secondary | ICD-10-CM | POA: Diagnosis not present

## 2021-08-14 DIAGNOSIS — D701 Agranulocytosis secondary to cancer chemotherapy: Secondary | ICD-10-CM | POA: Diagnosis not present

## 2021-08-14 DIAGNOSIS — T451X5A Adverse effect of antineoplastic and immunosuppressive drugs, initial encounter: Secondary | ICD-10-CM | POA: Diagnosis not present

## 2021-08-14 DIAGNOSIS — C539 Malignant neoplasm of cervix uteri, unspecified: Secondary | ICD-10-CM | POA: Diagnosis not present

## 2021-08-14 DIAGNOSIS — Z5111 Encounter for antineoplastic chemotherapy: Secondary | ICD-10-CM | POA: Diagnosis not present

## 2021-08-14 MED ORDER — HEPARIN SOD (PORK) LOCK FLUSH 100 UNIT/ML IV SOLN
500.0000 [IU] | Freq: Once | INTRAVENOUS | Status: AC | PRN
  Administered 2021-08-14: 500 [IU]

## 2021-08-14 MED ORDER — SODIUM CHLORIDE 0.9% FLUSH
10.0000 mL | INTRAVENOUS | Status: DC | PRN
Start: 2021-08-14 — End: 2021-08-14
  Administered 2021-08-14: 10 mL

## 2021-08-14 MED ORDER — PEGFILGRASTIM-CBQV 6 MG/0.6ML ~~LOC~~ SOSY
6.0000 mg | PREFILLED_SYRINGE | Freq: Once | SUBCUTANEOUS | Status: AC
  Administered 2021-08-14: 6 mg via SUBCUTANEOUS

## 2021-08-14 NOTE — Patient Instructions (Signed)

## 2021-08-21 DIAGNOSIS — Z933 Colostomy status: Secondary | ICD-10-CM | POA: Diagnosis not present

## 2021-08-21 DIAGNOSIS — J069 Acute upper respiratory infection, unspecified: Secondary | ICD-10-CM | POA: Diagnosis not present

## 2021-08-21 DIAGNOSIS — H1031 Unspecified acute conjunctivitis, right eye: Secondary | ICD-10-CM | POA: Diagnosis not present

## 2021-08-21 DIAGNOSIS — J019 Acute sinusitis, unspecified: Secondary | ICD-10-CM | POA: Diagnosis not present

## 2021-08-21 DIAGNOSIS — C20 Malignant neoplasm of rectum: Secondary | ICD-10-CM | POA: Diagnosis not present

## 2021-08-21 DIAGNOSIS — I872 Venous insufficiency (chronic) (peripheral): Secondary | ICD-10-CM | POA: Diagnosis not present

## 2021-08-25 ENCOUNTER — Inpatient Hospital Stay (HOSPITAL_COMMUNITY)
Admission: EM | Admit: 2021-08-25 | Discharge: 2021-08-28 | DRG: 871 | Disposition: A | Discharge status: Home / Self Care | Payer: Medicare HMO | Attending: Internal Medicine | Admitting: Internal Medicine

## 2021-08-25 ENCOUNTER — Other Ambulatory Visit: Payer: Self-pay

## 2021-08-25 ENCOUNTER — Other Ambulatory Visit: Payer: Self-pay | Admitting: Oncology

## 2021-08-25 ENCOUNTER — Emergency Department (HOSPITAL_COMMUNITY): Payer: Medicare HMO

## 2021-08-25 ENCOUNTER — Encounter (HOSPITAL_COMMUNITY): Payer: Self-pay

## 2021-08-25 DIAGNOSIS — I959 Hypotension, unspecified: Secondary | ICD-10-CM | POA: Diagnosis not present

## 2021-08-25 DIAGNOSIS — M1990 Unspecified osteoarthritis, unspecified site: Secondary | ICD-10-CM | POA: Diagnosis present

## 2021-08-25 DIAGNOSIS — Z7902 Long term (current) use of antithrombotics/antiplatelets: Secondary | ICD-10-CM

## 2021-08-25 DIAGNOSIS — I10 Essential (primary) hypertension: Secondary | ICD-10-CM | POA: Diagnosis present

## 2021-08-25 DIAGNOSIS — R4182 Altered mental status, unspecified: Secondary | ICD-10-CM

## 2021-08-25 DIAGNOSIS — R410 Disorientation, unspecified: Secondary | ICD-10-CM | POA: Diagnosis not present

## 2021-08-25 DIAGNOSIS — Z79899 Other long term (current) drug therapy: Secondary | ICD-10-CM

## 2021-08-25 DIAGNOSIS — R442 Other hallucinations: Secondary | ICD-10-CM | POA: Diagnosis not present

## 2021-08-25 DIAGNOSIS — Z87440 Personal history of urinary (tract) infections: Secondary | ICD-10-CM

## 2021-08-25 DIAGNOSIS — G929 Unspecified toxic encephalopathy: Secondary | ICD-10-CM | POA: Diagnosis present

## 2021-08-25 DIAGNOSIS — I700 Atherosclerosis of aorta: Secondary | ICD-10-CM | POA: Diagnosis not present

## 2021-08-25 DIAGNOSIS — R1110 Vomiting, unspecified: Secondary | ICD-10-CM | POA: Diagnosis not present

## 2021-08-25 DIAGNOSIS — Z933 Colostomy status: Secondary | ICD-10-CM

## 2021-08-25 DIAGNOSIS — E890 Postprocedural hypothyroidism: Secondary | ICD-10-CM | POA: Diagnosis present

## 2021-08-25 DIAGNOSIS — N179 Acute kidney failure, unspecified: Secondary | ICD-10-CM | POA: Diagnosis not present

## 2021-08-25 DIAGNOSIS — E119 Type 2 diabetes mellitus without complications: Secondary | ICD-10-CM | POA: Diagnosis present

## 2021-08-25 DIAGNOSIS — E872 Acidosis, unspecified: Secondary | ICD-10-CM | POA: Diagnosis present

## 2021-08-25 DIAGNOSIS — K921 Melena: Secondary | ICD-10-CM | POA: Diagnosis not present

## 2021-08-25 DIAGNOSIS — A419 Sepsis, unspecified organism: Secondary | ICD-10-CM | POA: Diagnosis not present

## 2021-08-25 DIAGNOSIS — Z87891 Personal history of nicotine dependence: Secondary | ICD-10-CM

## 2021-08-25 DIAGNOSIS — E785 Hyperlipidemia, unspecified: Secondary | ICD-10-CM | POA: Diagnosis present

## 2021-08-25 DIAGNOSIS — Z7982 Long term (current) use of aspirin: Secondary | ICD-10-CM

## 2021-08-25 DIAGNOSIS — I2510 Atherosclerotic heart disease of native coronary artery without angina pectoris: Secondary | ICD-10-CM | POA: Diagnosis present

## 2021-08-25 DIAGNOSIS — G9340 Encephalopathy, unspecified: Secondary | ICD-10-CM | POA: Diagnosis present

## 2021-08-25 DIAGNOSIS — Z9049 Acquired absence of other specified parts of digestive tract: Secondary | ICD-10-CM

## 2021-08-25 DIAGNOSIS — E876 Hypokalemia: Secondary | ICD-10-CM | POA: Diagnosis not present

## 2021-08-25 DIAGNOSIS — Z87442 Personal history of urinary calculi: Secondary | ICD-10-CM

## 2021-08-25 DIAGNOSIS — Z923 Personal history of irradiation: Secondary | ICD-10-CM

## 2021-08-25 DIAGNOSIS — Z79890 Hormone replacement therapy (postmenopausal): Secondary | ICD-10-CM

## 2021-08-25 DIAGNOSIS — L8990 Pressure ulcer of unspecified site, unspecified stage: Secondary | ICD-10-CM | POA: Insufficient documentation

## 2021-08-25 DIAGNOSIS — J42 Unspecified chronic bronchitis: Secondary | ICD-10-CM | POA: Diagnosis present

## 2021-08-25 DIAGNOSIS — Z85048 Personal history of other malignant neoplasm of rectum, rectosigmoid junction, and anus: Secondary | ICD-10-CM

## 2021-08-25 DIAGNOSIS — E039 Hypothyroidism, unspecified: Secondary | ICD-10-CM | POA: Diagnosis present

## 2021-08-25 DIAGNOSIS — Z743 Need for continuous supervision: Secondary | ICD-10-CM | POA: Diagnosis not present

## 2021-08-25 DIAGNOSIS — R404 Transient alteration of awareness: Secondary | ICD-10-CM | POA: Diagnosis not present

## 2021-08-25 DIAGNOSIS — B356 Tinea cruris: Secondary | ICD-10-CM | POA: Diagnosis present

## 2021-08-25 DIAGNOSIS — D500 Iron deficiency anemia secondary to blood loss (chronic): Secondary | ICD-10-CM | POA: Diagnosis present

## 2021-08-25 DIAGNOSIS — N939 Abnormal uterine and vaginal bleeding, unspecified: Secondary | ICD-10-CM

## 2021-08-25 DIAGNOSIS — Z794 Long term (current) use of insulin: Secondary | ICD-10-CM

## 2021-08-25 DIAGNOSIS — Z7984 Long term (current) use of oral hypoglycemic drugs: Secondary | ICD-10-CM

## 2021-08-25 DIAGNOSIS — L89151 Pressure ulcer of sacral region, stage 1: Secondary | ICD-10-CM | POA: Diagnosis present

## 2021-08-25 DIAGNOSIS — Z20822 Contact with and (suspected) exposure to covid-19: Secondary | ICD-10-CM | POA: Diagnosis present

## 2021-08-25 DIAGNOSIS — G9341 Metabolic encephalopathy: Secondary | ICD-10-CM | POA: Diagnosis present

## 2021-08-25 DIAGNOSIS — Z8701 Personal history of pneumonia (recurrent): Secondary | ICD-10-CM

## 2021-08-25 DIAGNOSIS — Z885 Allergy status to narcotic agent status: Secondary | ICD-10-CM

## 2021-08-25 DIAGNOSIS — Z8541 Personal history of malignant neoplasm of cervix uteri: Secondary | ICD-10-CM

## 2021-08-25 DIAGNOSIS — E1110 Type 2 diabetes mellitus with ketoacidosis without coma: Secondary | ICD-10-CM

## 2021-08-25 DIAGNOSIS — N390 Urinary tract infection, site not specified: Secondary | ICD-10-CM

## 2021-08-25 DIAGNOSIS — Z8673 Personal history of transient ischemic attack (TIA), and cerebral infarction without residual deficits: Secondary | ICD-10-CM

## 2021-08-25 LAB — CBC WITH DIFFERENTIAL/PLATELET
Abs Immature Granulocytes: 1.06 10*3/uL — ABNORMAL HIGH (ref 0.00–0.07)
Basophils Absolute: 0 10*3/uL (ref 0.0–0.1)
Basophils Relative: 0 %
Eosinophils Absolute: 0 10*3/uL (ref 0.0–0.5)
Eosinophils Relative: 0 %
HCT: 25.3 % — ABNORMAL LOW (ref 36.0–46.0)
Hemoglobin: 8 g/dL — ABNORMAL LOW (ref 12.0–15.0)
Immature Granulocytes: 11 %
Lymphocytes Relative: 9 %
Lymphs Abs: 0.9 10*3/uL (ref 0.7–4.0)
MCH: 27.8 pg (ref 26.0–34.0)
MCHC: 31.6 g/dL (ref 30.0–36.0)
MCV: 87.8 fL (ref 80.0–100.0)
Monocytes Absolute: 0.8 10*3/uL (ref 0.1–1.0)
Monocytes Relative: 8 %
Neutro Abs: 6.9 10*3/uL (ref 1.7–7.7)
Neutrophils Relative %: 72 %
Platelets: 105 10*3/uL — ABNORMAL LOW (ref 150–400)
RBC: 2.88 MIL/uL — ABNORMAL LOW (ref 3.87–5.11)
RDW: 19.9 % — ABNORMAL HIGH (ref 11.5–15.5)
WBC: 9.7 10*3/uL (ref 4.0–10.5)
nRBC: 0.5 % — ABNORMAL HIGH (ref 0.0–0.2)

## 2021-08-25 LAB — URINALYSIS, ROUTINE W REFLEX MICROSCOPIC
Bilirubin Urine: NEGATIVE
Glucose, UA: NEGATIVE mg/dL
Ketones, ur: NEGATIVE mg/dL
Nitrite: NEGATIVE
Protein, ur: 30 mg/dL — AB
Specific Gravity, Urine: 1.008 (ref 1.005–1.030)
WBC, UA: >50 WBC/hpf — ABNORMAL HIGH (ref 0–5)
pH: 6 (ref 5.0–8.0)

## 2021-08-25 LAB — COMPREHENSIVE METABOLIC PANEL
ALT: 9 U/L (ref 0–44)
AST: 21 U/L (ref 15–41)
Albumin: 2.9 g/dL — ABNORMAL LOW (ref 3.5–5.0)
Alkaline Phosphatase: 193 U/L — ABNORMAL HIGH (ref 38–126)
Anion gap: 18 — ABNORMAL HIGH (ref 5–15)
BUN: 21 mg/dL (ref 8–23)
CO2: 18 mmol/L — ABNORMAL LOW (ref 22–32)
Calcium: 8.3 mg/dL — ABNORMAL LOW (ref 8.9–10.3)
Chloride: 106 mmol/L (ref 98–111)
Creatinine, Ser: 1.67 mg/dL — ABNORMAL HIGH (ref 0.44–1.00)
GFR, Estimated: 32 mL/min — ABNORMAL LOW (ref >60)
Glucose, Bld: 183 mg/dL — ABNORMAL HIGH (ref 70–99)
Potassium: 2.8 mmol/L — ABNORMAL LOW (ref 3.5–5.1)
Sodium: 142 mmol/L (ref 135–145)
Total Bilirubin: 0.4 mg/dL (ref 0.3–1.2)
Total Protein: 5.9 g/dL — ABNORMAL LOW (ref 6.5–8.1)

## 2021-08-25 LAB — BLOOD GAS, VENOUS
Acid-base deficit: 0.5 mmol/L (ref 0.0–2.0)
Bicarbonate: 23.6 mmol/L (ref 20.0–28.0)
O2 Saturation: 65.7 %
Patient temperature: 98.6
pCO2, Ven: 38.5 mmHg — ABNORMAL LOW (ref 44.0–60.0)
pH, Ven: 7.405 (ref 7.250–7.430)
pO2, Ven: 39.3 mmHg (ref 32.0–45.0)

## 2021-08-25 LAB — LACTIC ACID, PLASMA
Lactic Acid, Venous: 4 mmol/L (ref 0.5–1.9)
Lactic Acid, Venous: 3.8 mmol/L (ref 0.5–1.9)
Lactic Acid, Venous: 4.9 mmol/L (ref 0.5–1.9)
Lactic Acid, Venous: 3.2 mmol/L (ref 0.5–1.9)

## 2021-08-25 LAB — CBG MONITORING, ED: Glucose-Capillary: 107 mg/dL — ABNORMAL HIGH (ref 70–99)

## 2021-08-25 LAB — PHOSPHORUS: Phosphorus: 3.1 mg/dL (ref 2.5–4.6)

## 2021-08-25 LAB — ETHANOL: Alcohol, Ethyl (B): <10 mg/dL (ref <10)

## 2021-08-25 LAB — TSH: TSH: 16.287 u[IU]/mL — ABNORMAL HIGH (ref 0.350–4.500)

## 2021-08-25 LAB — MAGNESIUM: Magnesium: 1 mg/dL — ABNORMAL LOW (ref 1.7–2.4)

## 2021-08-25 LAB — GLUCOSE, CAPILLARY: Glucose-Capillary: 112 mg/dL — ABNORMAL HIGH (ref 70–99)

## 2021-08-25 MED ORDER — SODIUM CHLORIDE 0.9% FLUSH
10.0000 mL | Freq: Two times a day (BID) | INTRAVENOUS | Status: DC
  Administered 2021-08-25 – 2021-08-27 (×5): 10 mL

## 2021-08-25 MED ORDER — METRONIDAZOLE 500 MG/100ML IV SOLN
500.0000 mg | Freq: Two times a day (BID) | INTRAVENOUS | Status: DC
Start: 2021-08-25 — End: 2021-08-25
  Administered 2021-08-25: 500 mg via INTRAVENOUS
  Filled 2021-08-25: qty 100

## 2021-08-25 MED ORDER — CHLORHEXIDINE GLUCONATE CLOTH 2 % EX PADS
6.0000 | MEDICATED_PAD | Freq: Every day | CUTANEOUS | Status: DC
  Administered 2021-08-26 – 2021-08-27 (×3): 6 via TOPICAL

## 2021-08-25 MED ORDER — LACTATED RINGERS IV BOLUS
1000.0000 mL | Freq: Once | INTRAVENOUS | Status: AC
  Administered 2021-08-25: 1000 mL via INTRAVENOUS

## 2021-08-25 MED ORDER — VANCOMYCIN HCL IN DEXTROSE 1-5 GM/200ML-% IV SOLN
1000.0000 mg | Freq: Once | INTRAVENOUS | Status: AC
  Administered 2021-08-25: 1000 mg via INTRAVENOUS
  Filled 2021-08-25: qty 200

## 2021-08-25 MED ORDER — SODIUM CHLORIDE 0.9 % IV SOLN
2.0000 g | Freq: Once | INTRAVENOUS | Status: AC
  Administered 2021-08-25: 2 g via INTRAVENOUS
  Filled 2021-08-25: qty 2

## 2021-08-25 MED ORDER — OFLOXACIN 0.3 % OP SOLN
2.0000 [drp] | Freq: Four times a day (QID) | OPHTHALMIC | Status: DC
  Administered 2021-08-26 – 2021-08-28 (×8): 2 [drp] via OPHTHALMIC
  Filled 2021-08-25: qty 5

## 2021-08-25 MED ORDER — SODIUM CHLORIDE 0.9 % IV SOLN
2.0000 g | INTRAVENOUS | Status: DC
  Filled 2021-08-25: qty 2

## 2021-08-25 MED ORDER — ENOXAPARIN SODIUM 30 MG/0.3ML IJ SOSY
30.0000 mg | PREFILLED_SYRINGE | INTRAMUSCULAR | Status: DC
  Administered 2021-08-25: 30 mg via SUBCUTANEOUS
  Filled 2021-08-25: qty 0.3

## 2021-08-25 MED ORDER — LACTATED RINGERS IV BOLUS (SEPSIS)
1000.0000 mL | Freq: Once | INTRAVENOUS | Status: AC
  Administered 2021-08-25: 1000 mL via INTRAVENOUS

## 2021-08-25 MED ORDER — ACETAMINOPHEN 325 MG PO TABS
650.0000 mg | ORAL_TABLET | Freq: Four times a day (QID) | ORAL | Status: DC | PRN
  Administered 2021-08-27: 650 mg via ORAL
  Filled 2021-08-25: qty 2

## 2021-08-25 MED ORDER — POTASSIUM CHLORIDE 10 MEQ/100ML IV SOLN
10.0000 meq | INTRAVENOUS | Status: AC
  Administered 2021-08-25 (×6): 10 meq via INTRAVENOUS
  Filled 2021-08-25 (×6): qty 100

## 2021-08-25 MED ORDER — VANCOMYCIN HCL IN DEXTROSE 1-5 GM/200ML-% IV SOLN: 1000.0000 mg | INTRAVENOUS | Status: DC

## 2021-08-25 MED ORDER — INSULIN ASPART 100 UNIT/ML IJ SOLN
0.0000 [IU] | Freq: Three times a day (TID) | INTRAMUSCULAR | Status: DC
  Administered 2021-08-26: 3 [IU] via SUBCUTANEOUS
  Administered 2021-08-26 – 2021-08-27 (×2): 2 [IU] via SUBCUTANEOUS
  Filled 2021-08-25: qty 0.15

## 2021-08-25 MED ORDER — MAGNESIUM SULFATE 2 GM/50ML IV SOLN: 2.0000 g | Freq: Once | INTRAVENOUS | Status: DC

## 2021-08-25 MED ORDER — ACETAMINOPHEN 650 MG RE SUPP: 650.0000 mg | Freq: Four times a day (QID) | RECTAL | Status: DC | PRN

## 2021-08-25 MED ORDER — POTASSIUM CHLORIDE IN NACL 20-0.9 MEQ/L-% IV SOLN
INTRAVENOUS | Status: DC
Start: 2021-08-25 — End: 2021-08-27
  Filled 2021-08-25 (×5): qty 1000

## 2021-08-25 MED ORDER — SODIUM CHLORIDE 0.9% FLUSH
10.0000 mL | INTRAVENOUS | Status: DC | PRN
  Administered 2021-08-28: 10 mL

## 2021-08-25 MED ORDER — MAGNESIUM SULFATE 4 GM/100ML IV SOLN
4.0000 g | Freq: Once | INTRAVENOUS | Status: AC
  Administered 2021-08-25: 4 g via INTRAVENOUS
  Filled 2021-08-25: qty 100

## 2021-08-25 NOTE — ED Triage Notes (Addendum)
Patient BIB [facility] from home. Altered mental status, nausea and vomiting since last night. Patient has cancer and supposed to go to chemo tomorrow. Patient has an ostomy.

## 2021-08-25 NOTE — Progress Notes (Signed)
Pharmacy Antibiotic Note  [name] is a 75 y.o. female with rectal cancer curremtly undergoing chemotherapy treatment (last cycle on 9/26) presented to the ED on 08/25/2021 with AMS and n/v.  Pharmacy has been consulted to dose cefepime and vancomycin for suspected sepsis.  - scr 1.67 - ANC 6.9  Plan: - vancomycin 1000 mg IV q48h for est AUC 484 - cefepime 2gm q24h - flagyl 52m q12h per MD - monitor renal function closely ______________________________________ Height: 5' (152.4 cm) Weight: 66 kg (145 lb 8.1 oz) IBW/kg (Calculated) : 45.5  Temp (24hrs), Avg:98 F (36.7 C), Min:98 F (36.7 C), Max:98 F (36.7 C)  Recent Labs  Lab 08/25/21 0727 08/25/21 1054  WBC 9.7  --   CREATININE 1.67*  --   LATICACIDVEN  --  4.0*    Estimated Creatinine Clearance: 24.7 mL/min (A) (by C-G formula based on SCr of 1.67 mg/dL (H)).    Allergies  Allergen Reactions   Codeine Nausea Only     Thank you for allowing pharmacy to be a part of this patient's care.  PLynelle Doctor10/07/2021 12:08 PM

## 2021-08-25 NOTE — H&P (Signed)
History and Physical    [name] QMG:[id number] DOB: 1946/07/09 DOA: 08/25/2021  PCP: Everardo Beals, NP  Patient coming from: Home.  I have personally briefly reviewed patient's old medical records in [place]  Chief Complaint: Altered mental status.  HPI: [name] is a 75 y.o. female with medical history significant of osteoarthritis, history of other known hemorrhagic basal ganglia stroke, chronic bilateral lower extremity edema, cervical cancer, history of high-dose radiation brachytherapy, carotid artery occlusion, chronic bronchitis, Graves' disease with hyperthyroidism with post radioiodine hypothyroidism, or lithiasis, history of sepsis UTI due to Klebsiella, hyperlipidemia, history of pneumonia, history of oral thrush, type 2 diabetes who is coming to the emergency department due to altered mental status.  The patient's daughter stated over the phone that she started becoming somnolent yesterday p.m.  She subsequently went to sleep and her husband had difficulty waking her up in the morning.  She responds to touch and is able to answer some simple questions but goes back to sleep afterwards.  She is unable to provide further information at this time.  ED Course: Initial vital signs were temperature 98 F, pulse 70, respirations 16, BP 140/55 mmHg and O2 sat 98% on room air.  The patient received 1000 mL of LR bolus, KCl 10 mEq IVPB x6.  I added another 3000 mL of LR bolus, cefepime, metronidazole and vancomycin.  Lab work: Her urinalysis show large leukocyte esterase, more than 50 WBC per HPI and rare bacteria microscopic examination.  Lactic acid was 4.0 and then 3.8 mmol/L.  Venous blood gas showed a pH of 7.405 PCO2 of 38.5 and PO2 of 39.3 mmHg.  CBC was 9.7 with 72% neutrophils, 9% lymphocytes and 8% monocytes, hemoglobin 8.0 g/dL and platelets 105.  Phosphorus was 3.1 and magnesium 1.0 mg/dL.  CMP showed a potassium of 2.8 and CO2 18 mmol/L with an anion gap  of 18.  The rest of the electrolytes are normal when calcium is corrected albumin.  Glucose 183, BUN 21 and creatinine 1.67 mg/dL.  Her baseline is usually between 1.1 and 1.2 mg/dL.  Total protein 5.9 and albumin 2.9 g/dL.  Alkaline phosphatase is 193 units/L.  Transaminases and bilirubin were normal.  TSH 16.287 9 IU/mL.  Imaging: A portable chest radiograph did not show any evidence of acute cardiopulmonary disease.  There was aortic atherosclerosis.  CT head without contrast showed moderate new bilateral paranasal sinus inflammation.  There was no acute intracranial abnormality.  Please see images and full radiology report for further detail.  Review of Systems: As per HPI otherwise all other systems reviewed and are negative.  Past Medical History:  Diagnosis Date   Arthritis    Basal ganglia stroke ([name])    Bilateral lower extremity edema    Cancer ([place])    Carotid artery occlusion    Flank pain    Full dentures    History of cervical cancer    11/ 1994  Stage IB  s/p  high dose radiation brachytherapy @ [facility] 01/ 1995--- per pt no recurrence   History of chronic bronchitis    History of hyperthyroidism    d 03/ 2011---due to grave's disease--- s/p  RAI treatment 04/ 2011   History of kidney stones    History of sepsis 05/03/2018   due to UTI with klebsiella/ pyelonephritis/ ureteral obstruction cause by stone   Hyperlipidemia    Hypothyroidism, postradioiodine therapy    endocrinologist--  dr Loanne Drilling--  dx graves disease and s/p  RAI i131 treatement 4/ 2011   Nauseated    Oral thrush 05/03/2018   Pneumonia    Type 2 diabetes mellitus treated with insulin ([place])    FOLLOWED BY PCP   Urgency of urination    Wears glasses     Past Surgical History:  Procedure Laterality Date   APPENDECTOMY     BIOPSY  01/23/2021   Procedure: BIOPSY;  Surgeon: Mauri Pole, MD;  Location: [facility];  Service: Endoscopy;;   CAROTID ENDARTERECTOMY Right 03/28/2020   CATARACT EXTRACTION  W/ INTRAOCULAR LENS  IMPLANT, BILATERAL  2004  approx.   COLONOSCOPY WITH PROPOFOL N/A 01/23/2021   Procedure: COLONOSCOPY WITH PROPOFOL;  Surgeon: Mauri Pole, MD;  Location: [facility];  Service: Endoscopy;  Laterality: N/A;   CYSTOSCOPY WITH STENT PLACEMENT Right 05/04/2018   Procedure: CYSTOSCOPY WITH STENT PLACEMENT AND RETROGRADE PYELOGRAM;  Surgeon: Ceasar Mons, MD;  Location: [facility];  Service: Urology;  Laterality: Right;   CYSTOSCOPY/URETEROSCOPY/HOLMIUM LASER/STENT PLACEMENT Right 05/19/2018   Procedure: CYSTOSCOPY, URETEROSCOPY/HOLMIUM LASER, STONE BASKETRY/ STENT EXCHANGE;  Surgeon: Ceasar Mons, MD;  Location: [facility];  Service: Urology;  Laterality: Right;   ENDARTERECTOMY Right 03/28/2020   Procedure: RIGHT CAROTID ENDARTERECTOMY with PATCH ANGIOPLASTY;  Surgeon: Rosetta Posner, MD;  Location: [facility];  Service: Vascular;  Laterality: Right;   EXCISION MASS LEFT CHEST WALL  09-20-2007   dr Grandville Silos  [facility]   Radioactive Iodine Therapy     for thyroid   REVISION TOTAL HIP ARTHROPLASTY Left early 2000s   SUBMUCOSAL TATTOO INJECTION  01/23/2021   Procedure: SUBMUCOSAL TATTOO INJECTION;  Surgeon: Mauri Pole, MD;  Location: [facility];  Service: Endoscopy;;   TANDEM RING INSERTION  1995   dr Aldean Ast @ [facility]   EUA w/ tandem placement in ovid  (for direct high dose radiation brachytherapy , cervical cancer)   TOTAL HIP ARTHROPLASTY Left 1990s   TRANSTHORACIC ECHOCARDIOGRAM  08/07/2017   mild focal basal hypertrophy of the septum,  ef 09-73%, grade 1 diastolic dysfunction/  atrial septum with lipomatous hypertrophy/  trivial PR    Social History  reports that she quit smoking about 39 years ago. Her smoking use included cigarettes. She has never used smokeless tobacco. She reports that she does not drink alcohol and does not use drugs.  Allergies  Allergen Reactions   Codeine Nausea Only    Family History  Problem  Relation Age of Onset   Emphysema Father    Diabetes Father    Emphysema Sister    Emphysema Brother    Cancer Mother        Skin Cancer   Diabetes Mother    Colon cancer Neg Hx    Esophageal cancer Neg Hx    Rectal cancer Neg Hx    Stomach cancer Neg Hx    Prior to Admission medications   Medication Sig Start Date End Date Taking? Authorizing Provider  acetaminophen (TYLENOL) 500 MG tablet Take 500 mg by mouth every 6 (six) hours as needed for mild pain or fever.   Yes [provider]  aspirin EC 81 MG EC tablet Take 1 tablet (81 mg total) by mouth daily. 12/16/19  Yes Hosie Poisson, MD  atorvastatin (LIPITOR) 40 MG tablet Take 40 mg by mouth daily. 12/15/19  Yes [provider]  baclofen (LIORESAL) 10 MG tablet Take 10 mg by mouth 3 (three) times daily. 03/22/21  Yes [provider]  cephALEXin (KEFLEX) 250 MG capsule  Take 250 mg by mouth daily. 03/20/21  Yes [provider]  clopidogrel (PLAVIX) 75 MG tablet Take 1 tablet (75 mg total) by mouth daily. 12/16/19  Yes Hosie Poisson, MD  ergocalciferol (VITAMIN D2) 50000 UNITS capsule Take 50,000 Units by mouth every Sunday.    Yes [provider]  fluconazole (DIFLUCAN) 150 MG tablet Take 150 mg by mouth See admin instructions. TAKE 1 TABLET BY MOUTH ONCE DAILY FOR 3 DAYS THEN 1 TABLET WEEKLY   Yes [provider]  gabapentin (NEURONTIN) 300 MG capsule Take 300 mg by mouth at bedtime.   Yes [provider]  gemfibrozil (LOPID) 600 MG tablet Take 600 mg by mouth 2 (two) times daily before a meal.   Yes [provider]  insulin degludec (TRESIBA FLEXTOUCH) 100 UNIT/ML FlexTouch Pen Inject 40 Units into the skin daily. 10/01/20  Yes [provider]  Lancets (ONETOUCH DELICA PLUS [id number]) MISC Apply topically 2 (two) times daily. 12/02/20  Yes [provider]  levothyroxine (SYNTHROID) 100 MCG tablet Take 100 mcg by mouth daily before breakfast. 12/16/19  Yes  [provider]  lidocaine-prilocaine (EMLA) cream Apply to port site 1-2 hours prior to use 07/08/21  Yes Owens Shark, NP  metFORMIN (GLUCOPHAGE) 500 MG tablet Take 500 mg by mouth daily with breakfast. 02/01/21  Yes [provider]  methocarbamol (ROBAXIN) 500 MG tablet Take 500 mg by mouth 2 (two) times daily as needed for muscle spasms.   Yes [provider]  nystatin (MYCOSTATIN/NYSTOP) powder Apply 1 application topically 2 (two) times daily. To groin rash 07/29/21  Yes Ladell Pier, MD  nystatin cream (MYCOSTATIN) Apply 1 application topically 2 (two) times daily. Mix with triamcinolone cream to groin rash   Yes [provider]  ofloxacin (OCUFLOX) 0.3 % ophthalmic solution Place 1 drop into both eyes 4 (four) times daily. 08/21/21  Yes [provider]  ONETOUCH VERIO test strip 1 each 2 (two) times daily. 11/29/20  Yes [provider]  oxybutynin (DITROPAN-XL) 10 MG 24 hr tablet Take 10 mg by mouth daily. 01/13/21  Yes [provider]  oxyCODONE ER (XTAMPZA ER) 9 MG C12A Take 9 mg by mouth every 12 (twelve) hours.   Yes [provider]  polyethylene glycol powder (GLYCOLAX/MIRALAX) 17 GM/SCOOP powder Take 1 Container by mouth daily.   Yes [provider]  potassium chloride SA (KLOR-CON) 20 MEQ tablet Take 1 tablet (20 mEq total) by mouth daily. 08/12/21  Yes Owens Shark, NP  sertraline (ZOLOFT) 25 MG tablet Take 25 mg by mouth daily. 07/01/21  Yes [provider]  sulfamethoxazole-trimethoprim (BACTRIM DS) 800-160 MG tablet Take 1 tablet by mouth 2 (two) times daily. 08/21/21  Yes [provider]  triamcinolone cream (KENALOG) 0.1 % Apply 1 application topically daily as needed (vaginal itching). Mix with nystatin cream 09/19/20  Yes [provider]  ondansetron (ZOFRAN-ODT) 8 MG disintegrating tablet Take 8 mg by mouth 3 (three) times daily. 12/18/20   [provider]    Physical Exam: Vitals:   08/25/21 1415 08/25/21 1430 08/25/21 1445 08/25/21 1515  BP: (!) 110/42 (!) 100/40 (!) 100/40 (!) 101/38  Pulse: (!) 54 (!) 48 (!) 51 (!) 49  Resp: 11 13 12 11   Temp:      TempSrc:      SpO2: 100% 100% 99% 100%  Weight:      Height:       Constitutional: Chronically ill-appearing.  Somnolent.  NAD, calm, comfortable. Eyes: PERRL, lids and conjunctivae normal.  Mildly injected sclera. ENMT: Mucous membranes and lips are dry.  Posterior pharynx clear of any exudate or lesions. Neck: normal, supple, no masses, no thyromegaly Respiratory: clear to auscultation bilaterally, no wheezing, no crackles. Normal respiratory effort. No accessory muscle use.  Cardiovascular: Regular rate and rhythm, no murmurs / rubs / gallops. No extremity edema. 2+ pedal pulses. No carotid bruits.  Abdomen: No distention.  Colostomy in place.  Bowel sounds positive.  Soft, tenderness, no masses palpated. No hepatosplenomegaly.  Musculoskeletal: Severe generalized weakness.  No clubbing / cyanosis. Good ROM, no contractures. Normal muscle tone.  Skin: no acute rashes, lesions, ulcers on very limited dermatological examination. Neurologic: CN 2-12 grossly intact. Sensation intact, DTR normal. Strength 5/5 in all 4.  Psychiatric: Somnolent.  Wakes up to touch.  Answers simple questions.  Oriented to self, knows she is in a hospital but she thought this was admitted from facility.  Other than knowing that it is October she is disoriented to time and date.  Labs on Admission: I have personally reviewed following labs and imaging studies  CBC: Recent Labs  Lab 08/25/21 0727  WBC 9.7  NEUTROABS 6.9  HGB 8.0*  HCT 25.3*  MCV 87.8  PLT 712*   Basic Metabolic Panel: Recent Labs  Lab 08/25/21 0727 08/25/21 1022 08/25/21 1033  NA 142  --   --   K 2.8*  --   --   CL 106  --   --   CO2 18*  --   --   GLUCOSE 183*  --   --   BUN 21  --   --   CREATININE 1.67*  --   --   CALCIUM  8.3*  --   --   MG  --  1.0*  --   PHOS  --   --  3.1   GFR: Estimated Creatinine Clearance: 24.7 mL/min (A) (by C-G formula based on SCr of 1.67 mg/dL (H)).  Liver Function Tests: Recent Labs  Lab 08/25/21 0727  AST 21  ALT 9  ALKPHOS 193*  BILITOT 0.4  PROT 5.9*  ALBUMIN 2.9*   Urine analysis:    Component Value Date/Time   COLORURINE YELLOW 08/25/2021 0727   APPEARANCEUR CLEAR 08/25/2021 0727   LABSPEC 1.008 08/25/2021 0727   PHURINE 6.0 08/25/2021 0727   GLUCOSEU NEGATIVE 08/25/2021 0727   HGBUR SMALL (A) 08/25/2021 0727   BILIRUBINUR NEGATIVE 08/25/2021 0727   KETONESUR NEGATIVE 08/25/2021 0727   PROTEINUR 30 (A) 08/25/2021 0727        NITRITE NEGATIVE 08/25/2021 0727   LEUKOCYTESUR LARGE (A) 08/25/2021 0727   Radiological Exams on Admission: CT Head Wo Contrast  Result Date: 08/25/2021 CLINICAL DATA:  75 year old female with altered mental status, nausea and vomiting. Anal carcinoma. EXAM: CT HEAD WITHOUT CONTRAST TECHNIQUE: Contiguous axial images were obtained from the base of the skull through the vertex without intravenous contrast. COMPARISON:  Brain MRI 09/30/2020.  Head CT 09/30/2020. FINDINGS: Brain: Chronic lacunar infarct left corona radiata. Stable cerebral volume. No midline shift, ventriculomegaly, mass effect, evidence of mass lesion, intracranial hemorrhage or evidence of cortically based acute infarction. Stable gray-white matter differentiation. Vascular: Calcified atherosclerosis at the skull base. No suspicious intracranial vascular hyperdensity. Skull: No acute or suspicious osseous lesion. Sinuses/Orbits: Moderate new bilateral paranasal sinus mucosal thickening throughout. Trace sinus fluid. Tympanic cavities and mastoids remain clear. Other: No acute orbit or scalp soft tissue finding. Visible face soft  tissues appear negative. IMPRESSION: 1. Moderate new bilateral paranasal sinus inflammation. No complicating features identified. 2. No acute  intracranial abnormality. Stable non contrast CT appearance of the brain since last year. Electronically Signed   By: Genevie Ann M.D.   On: 08/25/2021 08:14   DG Chest Port 1 View  Result Date: 08/25/2021 CLINICAL DATA:  75 year old female with history of altered mental status and vomiting. EXAM: PORTABLE CHEST 1 VIEW COMPARISON:  Chest x-ray 01/18/2021. FINDINGS: Right internal jugular single-lumen porta cath with tip terminating at the superior cavoatrial junction. Lung volumes are normal. No consolidative airspace disease. No pleural effusions. No pneumothorax. No pulmonary nodule or mass noted. Pulmonary vasculature and the cardiomediastinal silhouette are within normal limits. Atherosclerosis in the thoracic aorta. IMPRESSION: 1. No radiographic evidence of acute cardiopulmonary disease. 2. Aortic atherosclerosis. Electronically Signed   By: Vinnie Langton M.D.   On: 08/25/2021 07:45    EKG: Independently reviewed.  Vent. rate 65 BPM PR interval * ms QRS duration 106 ms QT/QTcB 623/648 ms P-R-T axes * 63 87 Junctional rhythm Low voltage, precordial leads Nonspecific T abnormalities, lateral leads Prolonged QT interval  Assessment/Plan Principal Problem:   AKI (acute kidney injury) ([place]) In the setting of:   Acute toxic encephalopathy Secondary to   Sepsis secondary to UTI ([place]) Observation/PCU. Continue IV fluids. Monitor intake and output. Follow renal function electrolytes. Continue cefepime per pharmacy. Follow urine culture and sensitivity.  Active Problems: Will continue oral medications once more alert.   Hypothyroidism Continue levothyroxine 100 mcg p.o. daily. May need dose adjustment given elevated TSH. Follow-up with PCP as an outpatient.    Hypokalemia/hypomagnesemia Replacement given. Follow-up electrolytes in the morning.    Type 2 diabetes mellitus (HCC) Carbohydrate modified diet. CBG monitoring before meals and bedtime. RI SS 3 times a day with  meals. Resume Tresiba 40 units daily once eating. Check hemoglobin A1c.    Essential hypertension Hold antihypertensives at this time. Monitor blood pressure.    Iron deficiency anemia due to chronic blood loss Monitor H&H. Transfuse as needed.    Coronary artery disease Continue aspirin, atorvastatin and clopidogrel once more alert.    DVT prophylaxis: Lovenox SQ. Code Status:   Full code. Family Communication:   Disposition Plan:   Patient is from:  Home.  Anticipated DC to:  TBD.  Anticipated DC date:  08/27/2021 or 08/28/2021.  Anticipated DC barriers: Clinical status.  Consults called:   Admission status:  Progressive care unit/inpatient.  Severity of Illness:  Reubin Milan MD [facility]  How to contact the [facility] Attending or Consulting provider [place] or covering provider during after hours [place], for this patient?   Check the care team in [facility] and look for a) attending/consulting [facility] provider listed and b) the [facility] team listed Log into [email or web address] and use [place] universal password to access. If you do not have the password, please contact the hospital operator. Locate the [facility] provider you are looking for under [facility] and page to a number that you can be directly reached. If you still have difficulty reaching the provider, please page the [facility] (Director on Call) for the Hospitalists listed on amion for assistance.  08/25/2021, 4:08 PM   This document was prepared using Dragon voice recognition software and may contain some unintended transcription errors.

## 2021-08-25 NOTE — Progress Notes (Signed)
[place] following for sepsis protocol. 

## 2021-08-25 NOTE — ED Provider Notes (Addendum)
[facility]ERGENCY DEPARTMENT Provider Note  CSN: [id number] Arrival date & time: 08/25/21 5465    History Chief Complaint  Patient presents with   Altered Mental Status   Emesis    NATASHA BURDA is a 75 y.o. female with history of rectal cancer s/p colostomy brought by EMS for evaluation of confusion and multiple episodes of vomiting since last night. Patient is unable to provide any history and not family yet at bedside. Level 5 caveat applies.    Past Medical History:  Diagnosis Date   Arthritis    Basal ganglia stroke ([place])    Bilateral lower extremity edema    Cancer ([place])    Carotid artery occlusion    Flank pain    Full dentures    History of cervical cancer    11/ 1994  Stage IB  s/p  high dose radiation brachytherapy @ [facility] 01/ 1995--- per pt no recurrence   History of chronic bronchitis    History of hyperthyroidism    d 03/ 2011---due to grave's disease--- s/p  RAI treatment 04/ 2011   History of kidney stones    History of sepsis 05/03/2018   due to UTI with klebsiella/ pyelonephritis/ ureteral obstruction cause by stone   Hyperlipidemia    Hypothyroidism, postradioiodine therapy    endocrinologist--  dr Loanne Drilling--  dx graves disease and s/p RAI i131 treatement 4/ 2011   Nauseated    Oral thrush 05/03/2018   Pneumonia    Type 2 diabetes mellitus treated with insulin ([place])    FOLLOWED BY PCP   Urgency of urination    Wears glasses     Past Surgical History:  Procedure Laterality Date   APPENDECTOMY     BIOPSY  01/23/2021   Procedure: BIOPSY;  Surgeon: Mauri Pole, MD;  Location: [facility];  Service: Endoscopy;;   CAROTID ENDARTERECTOMY Right 03/28/2020   CATARACT EXTRACTION W/ INTRAOCULAR LENS  IMPLANT, BILATERAL  2004  approx.   COLONOSCOPY WITH PROPOFOL N/A 01/23/2021   Procedure: COLONOSCOPY WITH PROPOFOL;  Surgeon: Mauri Pole, MD;  Location: [facility];  Service: Endoscopy;  Laterality: N/A;   CYSTOSCOPY WITH STENT  PLACEMENT Right 05/04/2018   Procedure: CYSTOSCOPY WITH STENT PLACEMENT AND RETROGRADE PYELOGRAM;  Surgeon: Ceasar Mons, MD;  Location: [facility];  Service: Urology;  Laterality: Right;   CYSTOSCOPY/URETEROSCOPY/HOLMIUM LASER/STENT PLACEMENT Right 05/19/2018   Procedure: CYSTOSCOPY, URETEROSCOPY/HOLMIUM LASER, STONE BASKETRY/ STENT EXCHANGE;  Surgeon: Ceasar Mons, MD;  Location[facility];  Service: Urology;  Laterality: Right;   ENDARTERECTOMY Right 03/28/2020   Procedure: RIGHT CAROTID ENDARTERECTOMY with PATCH ANGIOPLASTY;  Surgeon: Rosetta Posner, MD;  Location: [facility] OR;  Service: Vascular;  Laterality: Right;   EXCISION MASS LEFT CHEST WALL  09-20-2007   dr Grandville Silos  [name]   Radioactive Iodine Therapy     for thyroid   REVISION TOTAL HIP ARTHROPLASTY Left early 2000s   SUBMUCOSAL TATTOO INJECTION  01/23/2021   Procedure: SUBMUCOSAL TATTOO INJECTION;  Surgeon: Mauri Pole, MD;  Location: [facility];  Service: Endoscopy;;   TANDEM RING INSERTION  1995   dr Aldean Ast @ [facility]   EUA w/ tandem placement in ovid  (for direct high dose radiation brachytherapy , cervical cancer)   TOTAL HIP ARTHROPLASTY Left 1990s   TRANSTHORACIC ECHOCARDIOGRAM  08/07/2017   mild focal basal hypertrophy of the septum,  ef 68-12%, grade 1 diastolic dysfunction/  atrial septum with lipomatous hypertrophy/  trivial PR  Family History  Problem Relation Age of Onset   Emphysema Father    Diabetes Father    Emphysema Sister    Emphysema Brother    Cancer Mother        Skin Cancer   Diabetes Mother    Colon cancer Neg Hx    Esophageal cancer Neg Hx    Rectal cancer Neg Hx    Stomach cancer Neg Hx     Social History   Tobacco Use   Smoking status: Former    Years: 5.00    Types: Cigarettes    Quit date: 05/13/1982    Years since quitting: 39.3   Smokeless tobacco: Never  Vaping Use   Vaping Use: Never used  Substance Use Topics   Alcohol use: No   Drug  use: No     Home Medications Prior to Admission medications   Medication Sig Start Date End Date Taking? Authorizing Provider  acetaminophen (TYLENOL) 500 MG tablet Take by mouth.    [provider]  aspirin EC 81 MG EC tablet Take 1 tablet (81 mg total) by mouth daily. 12/16/19   Hosie Poisson, MD  atorvastatin (LIPITOR) 40 MG tablet Take 40 mg by mouth daily. 12/15/19   [provider]  baclofen (LIORESAL) 10 MG tablet Take 1 tablet by mouth 3 (three) times daily. 03/22/21   [provider]  cephALEXin (KEFLEX) 250 MG capsule Take 250 mg by mouth daily. 03/20/21   [provider]  clopidogrel (PLAVIX) 75 MG tablet Take 1 tablet (75 mg total) by mouth daily. 12/16/19   Hosie Poisson, MD  ergocalciferol (VITAMIN D2) 50000 UNITS capsule Take 50,000 Units by mouth every Sunday.     [provider]  fluconazole (DIFLUCAN) 150 MG tablet fluconazole 150 mg tablet  TAKE 1 TABLET BY MOUTH ONCE DAILY FOR 3 DAYS THEN 1 TABLET WEEKLY    [provider]  gabapentin (NEURONTIN) 300 MG capsule Take by mouth at bedtime.    [provider]  gemfibrozil (LOPID) 600 MG tablet Take 600 mg by mouth.    [provider]  insulin degludec (TRESIBA FLEXTOUCH) 100 UNIT/ML FlexTouch Pen Inject 40 Units into the skin daily. 10/01/20   [provider]  Lancets (ONETOUCH DELICA PLUS [id number]) MISC Apply topically 2 (two) times daily. 12/02/20   [provider]  levothyroxine (SYNTHROID) 100 MCG tablet Take 100 mcg by mouth daily before breakfast. 12/16/19   [provider]  lidocaine-prilocaine (EMLA) cream Apply to port site 1-2 hours prior to use 07/08/21   Owens Shark, NP  metFORMIN (GLUCOPHAGE) 500 MG tablet Take 500 mg by mouth daily with breakfast. 02/01/21   [provider]  methocarbamol (ROBAXIN) 500 MG tablet Take 500 mg by mouth 2 (two) times daily as needed for muscle spasms.    [provider]   nystatin (MYCOSTATIN/NYSTOP) powder Apply 1 application topically 2 (two) times daily. To groin rash 07/29/21   Ladell Pier, MD  nystatin cream (MYCOSTATIN) Apply 1 application topically 2 (two) times daily. Mix with triamcinolone cream to groin rash    [provider]  ondansetron (ZOFRAN) 8 MG tablet ondansetron HCl 8 mg tablet Patient not taking: Reported on 07/29/2021    [provider]  ondansetron (ZOFRAN-ODT) 8 MG disintegrating tablet Take 8 mg by mouth 3 (three) times daily. Patient not taking: Reported on 07/29/2021 12/18/20   [provider]  [facility] VERIO test strip 1 each 2 (two) times daily. 11/29/20  [provider]  oxybutynin (DITROPAN-XL) 10 MG 24 hr tablet Take 10 mg by mouth daily. 01/13/21   [provider]  oxyCODONE ER (XTAMPZA ER) 9 MG C12A Take 9 mg by mouth every 12 (twelve) hours.    [provider]  polyethylene glycol powder (GLYCOLAX/MIRALAX) 17 GM/SCOOP powder Take by mouth.    [provider]  potassium chloride SA (KLOR-CON) 20 MEQ tablet Take 1 tablet (20 mEq total) by mouth daily. 08/12/21   Owens Shark, NP  prochlorperazine (COMPAZINE) 10 MG tablet Take 10 mg by mouth every 6 (six) hours as needed. Patient not taking: Reported on 07/29/2021 06/03/21   [provider]  sertraline (ZOLOFT) 25 MG tablet Take 25 mg by mouth daily. 07/01/21   [provider]  triamcinolone cream (KENALOG) 0.1 % Apply 1 application topically daily as needed (vaginal itching). Mix with nystatin cream 09/19/20   [provider]     Allergies    Codeine   Review of Systems   Review of Systems Unable to assess due to mental status.    Physical Exam BP (!) 140/55 (BP Location: Left Arm)   Pulse 70   Temp 98 F (36.7 C) (Oral)   Resp 16   Ht 5' (1.524 m)   Wt 66 kg   SpO2 98%   BMI 28.42 kg/m   Physical Exam Vitals and nursing note reviewed.  Constitutional:      Appearance:  Normal appearance.     Comments: Somnolent, no distress  HENT:     Head: Normocephalic and atraumatic.     Nose: Nose normal.     Mouth/Throat:     Mouth: Mucous membranes are dry.  Eyes:     Extraocular Movements: Extraocular movements intact.     Conjunctiva/sclera: Conjunctivae normal.  Cardiovascular:     Rate and Rhythm: Normal rate.  Pulmonary:     Effort: Pulmonary effort is normal.     Breath sounds: Rhonchi present.  Abdominal:     General: Abdomen is flat.     Palpations: Abdomen is soft.     Tenderness: There is no abdominal tenderness.     Comments: Ostomy device in LLQ  Musculoskeletal:        General: No swelling. Normal range of motion.     Cervical back: Neck supple.  Skin:    General: Skin is warm and dry.  Neurological:     Comments: Somnolent, arouses to tactile stimuli, globally weak but able to move fingers and toes on all four extremities, no obvious facial droop or other focal deficit  Psychiatric:        Mood and Affect: Mood normal.     ED Results / Procedures / Treatments   Labs (all labs ordered are listed, but only abnormal results are displayed) Labs Reviewed  COMPREHENSIVE METABOLIC PANEL  CBC WITH DIFFERENTIAL/PLATELET  ETHANOL  TSH  URINALYSIS, ROUTINE W REFLEX MICROSCOPIC    EKG None  Radiology No results found.  Procedures Procedures  Medications Ordered in the ED Medications - No data to display   MDM Rules/Calculators/A&P MDM Patient with AMS and vomiting reported at home. Will check labs, CXR, CT, and monitor in the ED.   ED Course  I have reviewed the triage vital signs and the nursing notes.  Pertinent labs & imaging results that were available during my care of the patient were reviewed by me and considered in my medical decision making (see chart for details).  Clinical Course  as of 08/27/21 0832  Sun Aug 25, 2021  0812 CMP shows AKI and hypokalemia worsening from previous. Will give LR bolus, begin K  repletion.  [CS]  0768 CBC with anemia, worsened from recent baseline.  [CS]  0839 TSH is markedly elevated, she has history of prior radioactive iodine ablation and is on synthroid.  [CS]  [phone] Patient's daughter is at bedside, states she has had similar mental status changes with UTI. She has a Port which will be accessed for IVF and placed on Purewick for urine specimen.  [CS]  22 Spoke with Dr. Olevia Bowens, Hospitalist, who will evaluate for admission.  [CS]    Clinical Course User Index [CS] Truddie Hidden, MD    Final Clinical Impression(s) / ED Diagnoses Final diagnoses:  Altered mental status, unspecified altered mental status type  Lower urinary tract infectious disease  AKI (acute kidney injury) ([place])  Hypokalemia  Acquired hypothyroidism    Rx / DC Orders ED Discharge Orders     None        Truddie Hidden, MD 08/25/21 1332    Truddie Hidden, MD 08/27/21 [phone]

## 2021-08-26 ENCOUNTER — Inpatient Hospital Stay: Payer: Medicare HMO | Admitting: Nurse Practitioner

## 2021-08-26 ENCOUNTER — Inpatient Hospital Stay: Payer: Medicare HMO

## 2021-08-26 DIAGNOSIS — J42 Unspecified chronic bronchitis: Secondary | ICD-10-CM | POA: Diagnosis not present

## 2021-08-26 DIAGNOSIS — I10 Essential (primary) hypertension: Secondary | ICD-10-CM | POA: Diagnosis not present

## 2021-08-26 DIAGNOSIS — Z7902 Long term (current) use of antithrombotics/antiplatelets: Secondary | ICD-10-CM | POA: Diagnosis not present

## 2021-08-26 DIAGNOSIS — L89151 Pressure ulcer of sacral region, stage 1: Secondary | ICD-10-CM | POA: Diagnosis not present

## 2021-08-26 DIAGNOSIS — E119 Type 2 diabetes mellitus without complications: Secondary | ICD-10-CM | POA: Diagnosis not present

## 2021-08-26 DIAGNOSIS — Z20822 Contact with and (suspected) exposure to covid-19: Secondary | ICD-10-CM | POA: Diagnosis not present

## 2021-08-26 DIAGNOSIS — Z7984 Long term (current) use of oral hypoglycemic drugs: Secondary | ICD-10-CM | POA: Diagnosis not present

## 2021-08-26 DIAGNOSIS — I2510 Atherosclerotic heart disease of native coronary artery without angina pectoris: Secondary | ICD-10-CM | POA: Diagnosis not present

## 2021-08-26 DIAGNOSIS — G929 Unspecified toxic encephalopathy: Secondary | ICD-10-CM | POA: Diagnosis not present

## 2021-08-26 DIAGNOSIS — Z7982 Long term (current) use of aspirin: Secondary | ICD-10-CM | POA: Diagnosis not present

## 2021-08-26 DIAGNOSIS — B356 Tinea cruris: Secondary | ICD-10-CM | POA: Diagnosis not present

## 2021-08-26 DIAGNOSIS — D500 Iron deficiency anemia secondary to blood loss (chronic): Secondary | ICD-10-CM | POA: Diagnosis not present

## 2021-08-26 DIAGNOSIS — Z794 Long term (current) use of insulin: Secondary | ICD-10-CM | POA: Diagnosis not present

## 2021-08-26 DIAGNOSIS — N390 Urinary tract infection, site not specified: Secondary | ICD-10-CM | POA: Diagnosis not present

## 2021-08-26 DIAGNOSIS — N179 Acute kidney failure, unspecified: Secondary | ICD-10-CM | POA: Diagnosis not present

## 2021-08-26 DIAGNOSIS — Z933 Colostomy status: Secondary | ICD-10-CM | POA: Diagnosis not present

## 2021-08-26 DIAGNOSIS — K921 Melena: Secondary | ICD-10-CM | POA: Diagnosis not present

## 2021-08-26 DIAGNOSIS — E785 Hyperlipidemia, unspecified: Secondary | ICD-10-CM | POA: Diagnosis not present

## 2021-08-26 DIAGNOSIS — E876 Hypokalemia: Secondary | ICD-10-CM | POA: Diagnosis not present

## 2021-08-26 DIAGNOSIS — N939 Abnormal uterine and vaginal bleeding, unspecified: Secondary | ICD-10-CM | POA: Diagnosis not present

## 2021-08-26 DIAGNOSIS — L8990 Pressure ulcer of unspecified site, unspecified stage: Secondary | ICD-10-CM | POA: Insufficient documentation

## 2021-08-26 DIAGNOSIS — M1990 Unspecified osteoarthritis, unspecified site: Secondary | ICD-10-CM | POA: Diagnosis not present

## 2021-08-26 DIAGNOSIS — Z8541 Personal history of malignant neoplasm of cervix uteri: Secondary | ICD-10-CM | POA: Diagnosis not present

## 2021-08-26 DIAGNOSIS — E872 Acidosis, unspecified: Secondary | ICD-10-CM | POA: Diagnosis not present

## 2021-08-26 DIAGNOSIS — A419 Sepsis, unspecified organism: Secondary | ICD-10-CM | POA: Diagnosis not present

## 2021-08-26 DIAGNOSIS — E890 Postprocedural hypothyroidism: Secondary | ICD-10-CM | POA: Diagnosis not present

## 2021-08-26 LAB — CBC WITH DIFFERENTIAL/PLATELET
Abs Immature Granulocytes: 0.82 10*3/uL — ABNORMAL HIGH (ref 0.00–0.07)
Basophils Absolute: 0.1 10*3/uL (ref 0.0–0.1)
Basophils Relative: 1 %
Eosinophils Absolute: 0 10*3/uL (ref 0.0–0.5)
Eosinophils Relative: 0 %
HCT: 21.3 % — ABNORMAL LOW (ref 36.0–46.0)
Hemoglobin: 6.6 g/dL — CL (ref 12.0–15.0)
Immature Granulocytes: 9 %
Lymphocytes Relative: 11 %
Lymphs Abs: 1 10*3/uL (ref 0.7–4.0)
MCH: 28.4 pg (ref 26.0–34.0)
MCHC: 31 g/dL (ref 30.0–36.0)
MCV: 91.8 fL (ref 80.0–100.0)
Monocytes Absolute: 0.5 10*3/uL (ref 0.1–1.0)
Monocytes Relative: 5 %
Neutro Abs: 6.5 10*3/uL (ref 1.7–7.7)
Neutrophils Relative %: 74 %
Platelets: 82 10*3/uL — ABNORMAL LOW (ref 150–400)
RBC: 2.32 MIL/uL — ABNORMAL LOW (ref 3.87–5.11)
RDW: 20.9 % — ABNORMAL HIGH (ref 11.5–15.5)
WBC: 8.8 10*3/uL (ref 4.0–10.5)
nRBC: 1 % — ABNORMAL HIGH (ref 0.0–0.2)

## 2021-08-26 LAB — COMPREHENSIVE METABOLIC PANEL
ALT: 9 U/L (ref 0–44)
AST: 17 U/L (ref 15–41)
Albumin: 2.2 g/dL — ABNORMAL LOW (ref 3.5–5.0)
Alkaline Phosphatase: 142 U/L — ABNORMAL HIGH (ref 38–126)
Anion gap: 6 (ref 5–15)
BUN: 12 mg/dL (ref 8–23)
CO2: 23 mmol/L (ref 22–32)
Calcium: 7.7 mg/dL — ABNORMAL LOW (ref 8.9–10.3)
Chloride: 112 mmol/L — ABNORMAL HIGH (ref 98–111)
Creatinine, Ser: 1.05 mg/dL — ABNORMAL HIGH (ref 0.44–1.00)
GFR, Estimated: 55 mL/min — ABNORMAL LOW (ref >60)
Glucose, Bld: 117 mg/dL — ABNORMAL HIGH (ref 70–99)
Potassium: 3.7 mmol/L (ref 3.5–5.1)
Sodium: 141 mmol/L (ref 135–145)
Total Bilirubin: 0.5 mg/dL (ref 0.3–1.2)
Total Protein: 4.6 g/dL — ABNORMAL LOW (ref 6.5–8.1)

## 2021-08-26 LAB — IRON AND TIBC
Iron: 57 ug/dL (ref 28–170)
Saturation Ratios: 23 % (ref 10.4–31.8)
TIBC: 250 ug/dL (ref 250–450)
UIBC: 193 ug/dL

## 2021-08-26 LAB — GLUCOSE, CAPILLARY
Glucose-Capillary: 97 mg/dL (ref 70–99)
Glucose-Capillary: 163 mg/dL — ABNORMAL HIGH (ref 70–99)
Glucose-Capillary: 145 mg/dL — ABNORMAL HIGH (ref 70–99)
Glucose-Capillary: 227 mg/dL — ABNORMAL HIGH (ref 70–99)

## 2021-08-26 LAB — PREPARE RBC (CROSSMATCH)

## 2021-08-26 LAB — OCCULT BLOOD X 1 CARD TO LAB, STOOL: Fecal Occult Bld: NEGATIVE

## 2021-08-26 LAB — HEMOGLOBIN A1C
Hgb A1c MFr Bld: 7.7 % — ABNORMAL HIGH (ref 4.8–5.6)
Mean Plasma Glucose: 174.29 mg/dL

## 2021-08-26 LAB — TRANSFERRIN: Transferrin: 179 mg/dL — ABNORMAL LOW (ref 192–382)

## 2021-08-26 LAB — FERRITIN: Ferritin: 264 ng/mL (ref 11–307)

## 2021-08-26 MED ORDER — CLOPIDOGREL BISULFATE 75 MG PO TABS
75.0000 mg | ORAL_TABLET | Freq: Every day | ORAL | Status: DC
  Administered 2021-08-26 – 2021-08-27 (×2): 75 mg via ORAL
  Filled 2021-08-26 (×2): qty 1

## 2021-08-26 MED ORDER — ASPIRIN EC 81 MG PO TBEC
81.0000 mg | DELAYED_RELEASE_TABLET | Freq: Every day | ORAL | Status: DC
  Administered 2021-08-26 – 2021-08-27 (×2): 81 mg via ORAL
  Filled 2021-08-26 (×2): qty 1

## 2021-08-26 MED ORDER — ENOXAPARIN SODIUM 40 MG/0.4ML IJ SOSY
40.0000 mg | PREFILLED_SYRINGE | Freq: Every day | INTRAMUSCULAR | Status: DC
  Administered 2021-08-26 – 2021-08-27 (×2): 40 mg via SUBCUTANEOUS
  Filled 2021-08-26 (×2): qty 0.4

## 2021-08-26 MED ORDER — SODIUM CHLORIDE 0.9% IV SOLUTION: Freq: Once | INTRAVENOUS | Status: DC

## 2021-08-26 MED ORDER — GEMFIBROZIL 600 MG PO TABS
600.0000 mg | ORAL_TABLET | Freq: Two times a day (BID) | ORAL | Status: DC
  Administered 2021-08-26 – 2021-08-28 (×4): 600 mg via ORAL
  Filled 2021-08-26 (×4): qty 1

## 2021-08-26 MED ORDER — SODIUM CHLORIDE 0.9 % IV SOLN
2.0000 g | Freq: Two times a day (BID) | INTRAVENOUS | Status: DC
  Administered 2021-08-26 – 2021-08-27 (×3): 2 g via INTRAVENOUS
  Filled 2021-08-26 (×3): qty 2

## 2021-08-26 MED ORDER — LEVOTHYROXINE SODIUM 75 MCG PO TABS
150.0000 ug | ORAL_TABLET | Freq: Every day | ORAL | Status: DC
  Administered 2021-08-27 – 2021-08-28 (×2): 150 ug via ORAL
  Filled 2021-08-26 (×3): qty 2

## 2021-08-26 MED ORDER — NYSTATIN 100000 UNIT/GM EX POWD
Freq: Three times a day (TID) | CUTANEOUS | Status: DC
  Filled 2021-08-26: qty 15

## 2021-08-26 MED ORDER — ATORVASTATIN CALCIUM 40 MG PO TABS
40.0000 mg | ORAL_TABLET | Freq: Every day | ORAL | Status: DC
  Administered 2021-08-26 – 2021-08-28 (×3): 40 mg via ORAL
  Filled 2021-08-26 (×3): qty 1

## 2021-08-26 NOTE — Progress Notes (Addendum)
Progress Note:    [name]    TOI:[id number] DOB: 11/25/1945 DOA: 08/25/2021  PCP: Everardo Beals, NP    Brief Narrative:   75 year old female with a past medical history of rectal cancer currently undergoing chemotherapy, osteoarthritis, history of hemorrhagic basal ganglia stroke, chronic bilateral lower extremity edema, cervical cancer, chronic bronchitis, Graves' disease with hyperthyroidism status post RAI therapy and now hypothyroidism, recurrent UTIs on Keflex.  Presented to the emergency department after her daughter stated that she was more somnolent and altered.  Urinalysis was positive in the emergency department for UTI.     Subjective:   No acute events overnight.  Patient seen and examined this morning.  Son present at bedside.  She is back to her baseline mental status confirmed by the son.  Alert and oriented x3.  No fevers or chills.  Reports some tinea cruris.  Did speak with the daughter over the phone and she states she is compliant with her Synthroid.    Assessment and Plan:   Sepsis secondary to UTI: Continue cefepime.  Await sensitivities and susceptibilities.  Hypothyroidism: Uncontrolled.  TSH 16.  Compliant with Synthroid.  Discontinue home Synthroid 100 mcg daily.  Start Synthroid 150 mcg daily.  Repeat TSH in 6 weeks at PCP.  Acute on chronic anemia: Hgb this AM 6.6. Likely hemodilution drop.  No evidence of gastrointestinal bleeding.  Fecal occult negative.  1 unit of packed red blood cells ordered.  Iron studies ordered.  Lactic acidosis: Improved  Acute kidney injury: Resolved  Hypokalemia: Resolved  Metabolic acidosis: Resolved  Hypomagnesemia: Replacement given by admitting doctor.  Repeat Mg in AM.  Tinea cruris: Nystatin powder ordered 3 times daily  Type 2 diabetes mellitus: A1c 7.7.  Relatively well-controlled given age.  Continue home [place].  Continue sliding scale insulin 3 times daily with meals.  CBG monitoring  with meals and bedtime.  Continue carb controlled modified diet.  Hypertension: Blood pressures remain soft with systolics in the 83J.  Continue to hold antihypertensives.  Coronary artery disease: Continue home aspirin, atorvastatin and clopidogrel  Dyslipidemia: Continue home atorvastatin and gemfibrozil    Other information:    DVT prophylaxis: Lovenox subcu Code Status: Full code Family Communication: Son present at bedside.  Discussed plan of care with daughter over the phone Disposition:   Status is: Inpatient  Remains inpatient appropriate because:Ongoing diagnostic testing needed not appropriate for outpatient work up  Dispo: The patient is from: Home              Anticipated d/c is to: Home              Patient currently is not medically stable to d/c.   Difficult to place patient No           Consultants:   None    Objective:    Vitals:   08/25/21 2130 08/26/21 0007 08/26/21 0400 08/26/21 1258  BP: (!) 120/43 (!) 108/48 90/68 (!) 114/48  Pulse: (!) 59 62 68 72  Resp: 15 14 18 20   Temp:  98.1 F (36.7 C) 98.3 F (36.8 C) 98.4 F (36.9 C)  TempSrc:  Oral Oral Oral  SpO2: 99% 96% 97% 99%  Weight:      Height:        Intake/Output Summary (Last 24 hours) at 08/26/2021 1501 Last data filed at 08/26/2021 1417 Gross per 24 hour  Intake 3343.98 ml  Output 2000 ml  Net 1343.98 ml   [name]  Weights   08/25/21 0656 08/25/21 1659  Weight: 66 kg 66.3 kg       Physical Exam:    General exam: Appears calm and comfortable  Respiratory system: Clear to auscultation. Respiratory effort normal. Cardiovascular system: S1 & S2 heard, RRR. No JVD, murmurs, rubs, gallops or clicks. No pedal edema. Gastrointestinal system: Abdomen is nondistended, soft and nontender. No organomegaly or masses felt. Normal bowel sounds heard. Central nervous system: Alert and oriented. No focal neurological deficits. Extremities: Symmetric 5 x 5 power. Skin: No  rashes, lesions or ulcers Psychiatry: Judgement and insight appear normal. Mood & affect appropriate.     Data Reviewed:    I have personally reviewed following labs and imaging studies  CBC: Recent Labs  Lab 08/25/21 0727 08/26/21 0540  WBC 9.7 8.8  NEUTROABS 6.9 6.5  HGB 8.0* 6.6*  HCT 25.3* 21.3*  MCV 87.8 91.8  PLT 105* 82*    Basic Metabolic Panel: Recent Labs  Lab 08/25/21 0727 08/25/21 1022 08/25/21 1033 08/26/21 0350  NA 142  --   --  141  K 2.8*  --   --  3.7  CL 106  --   --  112*  CO2 18*  --   --  23  GLUCOSE 183*  --   --  117*  BUN 21  --   --  12  CREATININE 1.67*  --   --  1.05*  CALCIUM 8.3*  --   --  7.7*  MG  --  1.0*  --   --   PHOS  --   --  3.1  --     GFR: Estimated Creatinine Clearance: 39.3 mL/min (A) (by C-G formula based on SCr of 1.05 mg/dL (H)).  Liver Function Tests: Recent Labs  Lab 08/25/21 0727 08/26/21 0350  AST 21 17  ALT 9 9  ALKPHOS 193* 142*  BILITOT 0.4 0.5  PROT 5.9* 4.6*  ALBUMIN 2.9* 2.2*    CBG: Recent Labs  Lab 08/25/21 1251 08/25/21 1756 08/26/21 0741 08/26/21 1140  GLUCAP 107* 112* 97 163*     Recent Results (from the past 240 hour(s))  Culture, blood (routine x 2)     Status: None (Preliminary result)   Collection Time: 08/25/21  5:14 PM   Specimen: BLOOD  Result Value Ref Range Status   Specimen Description   Final    BLOOD BLOOD RIGHT HAND Performed at [facility], [place] [address], [place], [place] [address]    Special Requests   Final    BOTTLES DRAWN AEROBIC ONLY Blood Culture results may not be optimal due to an inadequate volume of blood received in culture bottles Performed at [facility] [address], [place], [place] [address]    Culture   Final    NO GROWTH < 12 HOURS Performed at [facility] [address], [place], [place] [address]    Report Status PENDING  Incomplete  Culture, blood (routine x 2)     Status: None  (Preliminary result)   Collection Time: 08/25/21  6:40 PM   Specimen: BLOOD  Result Value Ref Range Status   Specimen Description   Final    BLOOD BLOOD RIGHT WRIST Performed at [facility] [address], [place], [place] [address]    Special Requests   Final    BOTTLES DRAWN AEROBIC ONLY Blood Culture adequate volume Performed at [facility] [address] [place],  [place] [address]    Culture   Final    NO GROWTH < 12 HOURS Performed at [facility], [place] [address], [place], [place] [address]    Report Status PENDING  Incomplete         Radiology Studies:    CT Head Wo Contrast  Result Date: 08/25/2021 CLINICAL DATA:  75 year old female with altered mental status, nausea and vomiting. Anal carcinoma. EXAM: CT HEAD WITHOUT CONTRAST TECHNIQUE: Contiguous axial images were obtained from the base of the skull through the vertex without intravenous contrast. COMPARISON:  Brain MRI 09/30/2020.  Head CT 09/30/2020. FINDINGS: Brain: Chronic lacunar infarct left corona radiata. Stable cerebral volume. No midline shift, ventriculomegaly, mass effect, evidence of mass lesion, intracranial hemorrhage or evidence of cortically based acute infarction. Stable gray-white matter differentiation. Vascular: Calcified atherosclerosis at the skull base. No suspicious intracranial vascular hyperdensity. Skull: No acute or suspicious osseous lesion. Sinuses/Orbits: Moderate new bilateral paranasal sinus mucosal thickening throughout. Trace sinus fluid. Tympanic cavities and mastoids remain clear. Other: No acute orbit or scalp soft tissue finding. Visible face soft tissues appear negative. IMPRESSION: 1. Moderate new bilateral paranasal sinus inflammation. No complicating features identified. 2. No acute intracranial abnormality. Stable non contrast CT appearance of the brain since last year. Electronically Signed   By: Genevie Ann M.D.   On: 08/25/2021 08:14    DG Chest Port 1 View  Result Date: 08/25/2021 CLINICAL DATA:  75 year old female with history of altered mental status and vomiting. EXAM: PORTABLE CHEST 1 VIEW COMPARISON:  Chest x-ray 01/18/2021. FINDINGS: Right internal jugular single-lumen porta cath with tip terminating at the superior cavoatrial junction. Lung volumes are normal. No consolidative airspace disease. No pleural effusions. No pneumothorax. No pulmonary nodule or mass noted. Pulmonary vasculature and the cardiomediastinal silhouette are within normal limits. Atherosclerosis in the thoracic aorta. IMPRESSION: 1. No radiographic evidence of acute cardiopulmonary disease. 2. Aortic atherosclerosis. Electronically Signed   By: Vinnie Langton M.D.   On: 08/25/2021 07:45        Medications:    Scheduled Meds:  sodium chloride   Intravenous Once   aspirin EC  81 mg Oral Daily   atorvastatin  40 mg Oral Daily   Chlorhexidine Gluconate Cloth  6 each Topical Daily   clopidogrel  75 mg Oral Daily   enoxaparin (LOVENOX) injection  40 mg Subcutaneous Daily   gemfibrozil  600 mg Oral BID AC   insulin aspart  0-15 Units Subcutaneous TID WC   [START ON 08/27/2021] levothyroxine  150 mcg Oral Q0600   nystatin   Topical TID   ofloxacin  2 drop Both Eyes QID   sodium chloride flush  10-40 mL Intracatheter Q12H   Continuous Infusions:  0.9 % NaCl with KCl 20 mEq / L 75 mL/hr at 08/26/21 1239   ceFEPime (MAXIPIME) IV 2 g (08/26/21 1236)       LOS: 0 days    Time spent: 35 minutes    Leslee Home, MD [facility]   To contact the attending provider between 7A-7P or the covering provider during after hours 7P-7A, please log into the web site [email or web address] and access using universal [place] password for that web site. If you do not have the password, please call the hospital operator.  08/26/2021, 3:01 PM

## 2021-08-26 NOTE — Progress Notes (Signed)
Received critical lab result at 1143 from [facility], Hgb 6.6.  Result of recollected hemoglobin lab draw.  MD notified.  Angie Fava, RN

## 2021-08-26 NOTE — Progress Notes (Signed)
Pharmacy Antibiotic Note  [name] is a 75 y.o. female with rectal cancer curremtly undergoing chemotherapy treatment (last cycle on 9/26) presented to the ED on 08/25/2021 with AMS and n/v.  Pharmacy has been consulted to dose cefepime for UTI. - scr 1.67 >> 1.05  Plan: - Cefepime 2gm q12h - Follow up renal function, culture results, and clinical course.  ______________________________________ Height: 5' (152.4 cm) Weight: 66.3 kg (146 lb 2.6 oz) IBW/kg (Calculated) : 45.5  Temp (24hrs), Avg:97.9 F (36.6 C), Min:97.5 F (36.4 C), Max:98.3 F (36.8 C)  Recent Labs  Lab 08/25/21 0727 08/25/21 1054 08/25/21 1247 08/25/21 1840 08/25/21 2145 08/26/21 0350  WBC 9.7  --   --   --   --   --   CREATININE 1.67*  --   --   --   --  1.05*  LATICACIDVEN  --  4.0* 3.8* 4.9* 3.2*  --      Estimated Creatinine Clearance: 39.3 mL/min (A) (by C-G formula based on SCr of 1.05 mg/dL (H)).    Allergies  Allergen Reactions   Codeine Nausea Only   Antimicrobials this admission:  10/9 vanc>>10/9 10/9 cefepime>> 10/9 flagyl>>10/9   Microbiology results:  10/9  Bcx:  ngtd 10/9 UCx:    Thank you for allowing pharmacy to be a part of this patient's care.  Gretta Arab PharmD, BCPS Clinical Pharmacist [facility] main pharmacy [phone] 08/26/2021 10:09 AM

## 2021-08-27 ENCOUNTER — Inpatient Hospital Stay (HOSPITAL_COMMUNITY): Payer: Medicare HMO

## 2021-08-27 LAB — CBC WITH DIFFERENTIAL/PLATELET
Abs Immature Granulocytes: 2.03 10*3/uL — ABNORMAL HIGH (ref 0.00–0.07)
Basophils Absolute: 0 10*3/uL (ref 0.0–0.1)
Basophils Relative: 0 %
Eosinophils Absolute: 0 10*3/uL (ref 0.0–0.5)
Eosinophils Relative: 0 %
HCT: 27.1 % — ABNORMAL LOW (ref 36.0–46.0)
Hemoglobin: 8.7 g/dL — ABNORMAL LOW (ref 12.0–15.0)
Immature Granulocytes: 14 %
Lymphocytes Relative: 8 %
Lymphs Abs: 1.1 10*3/uL (ref 0.7–4.0)
MCH: 28.4 pg (ref 26.0–34.0)
MCHC: 32.1 g/dL (ref 30.0–36.0)
MCV: 88.6 fL (ref 80.0–100.0)
Monocytes Absolute: 0.8 10*3/uL (ref 0.1–1.0)
Monocytes Relative: 6 %
Neutro Abs: 10.2 10*3/uL — ABNORMAL HIGH (ref 1.7–7.7)
Neutrophils Relative %: 72 %
Platelets: 91 10*3/uL — ABNORMAL LOW (ref 150–400)
RBC: 3.06 MIL/uL — ABNORMAL LOW (ref 3.87–5.11)
RDW: 19 % — ABNORMAL HIGH (ref 11.5–15.5)
WBC: 14.1 10*3/uL — ABNORMAL HIGH (ref 4.0–10.5)
nRBC: 1.1 % — ABNORMAL HIGH (ref 0.0–0.2)

## 2021-08-27 LAB — SOLUBLE TRANSFERRIN RECEPTOR: Transferrin Receptor: 12.9 nmol/L (ref 12.2–27.3)

## 2021-08-27 LAB — TYPE AND SCREEN
ABO/RH(D): O POS
Antibody Screen: NEGATIVE
Unit division: 0

## 2021-08-27 LAB — OCCULT BLOOD X 1 CARD TO LAB, STOOL: Fecal Occult Bld: POSITIVE — AB

## 2021-08-27 LAB — COMPREHENSIVE METABOLIC PANEL
ALT: 10 U/L (ref 0–44)
AST: 21 U/L (ref 15–41)
Albumin: 2.3 g/dL — ABNORMAL LOW (ref 3.5–5.0)
Alkaline Phosphatase: 162 U/L — ABNORMAL HIGH (ref 38–126)
Anion gap: 9 (ref 5–15)
BUN: 8 mg/dL (ref 8–23)
CO2: 22 mmol/L (ref 22–32)
Calcium: 7.7 mg/dL — ABNORMAL LOW (ref 8.9–10.3)
Chloride: 107 mmol/L (ref 98–111)
Creatinine, Ser: 0.89 mg/dL (ref 0.44–1.00)
GFR, Estimated: >60 mL/min (ref >60)
Glucose, Bld: 117 mg/dL — ABNORMAL HIGH (ref 70–99)
Potassium: 3.6 mmol/L (ref 3.5–5.1)
Sodium: 138 mmol/L (ref 135–145)
Total Bilirubin: 1 mg/dL (ref 0.3–1.2)
Total Protein: 4.9 g/dL — ABNORMAL LOW (ref 6.5–8.1)

## 2021-08-27 LAB — RESP PANEL BY RT-PCR (FLU A&B, COVID) ARPGX2
Influenza A by PCR: NEGATIVE
Influenza B by PCR: NEGATIVE
SARS Coronavirus 2 by RT PCR: NEGATIVE

## 2021-08-27 LAB — URINE CULTURE: Culture: <10000 — AB

## 2021-08-27 LAB — BPAM RBC
Blood Product Expiration Date: 202211102359
ISSUE DATE / TIME: 202210102150
Unit Type and Rh: 5100

## 2021-08-27 LAB — GLUCOSE, CAPILLARY
Glucose-Capillary: 109 mg/dL — ABNORMAL HIGH (ref 70–99)
Glucose-Capillary: 138 mg/dL — ABNORMAL HIGH (ref 70–99)
Glucose-Capillary: 72 mg/dL (ref 70–99)
Glucose-Capillary: 76 mg/dL (ref 70–99)
Glucose-Capillary: 141 mg/dL — ABNORMAL HIGH (ref 70–99)
Glucose-Capillary: 134 mg/dL — ABNORMAL HIGH (ref 70–99)

## 2021-08-27 LAB — MAGNESIUM: Magnesium: 1.4 mg/dL — ABNORMAL LOW (ref 1.7–2.4)

## 2021-08-27 MED ORDER — PANTOPRAZOLE SODIUM 40 MG IV SOLR
40.0000 mg | Freq: Two times a day (BID) | INTRAVENOUS | Status: DC
  Administered 2021-08-27 – 2021-08-28 (×2): 40 mg via INTRAVENOUS
  Filled 2021-08-27 (×2): qty 40

## 2021-08-27 MED ORDER — MAGNESIUM SULFATE 2 GM/50ML IV SOLN
2.0000 g | Freq: Once | INTRAVENOUS | Status: AC
  Administered 2021-08-27: 2 g via INTRAVENOUS
  Filled 2021-08-27: qty 50

## 2021-08-27 MED ORDER — LORATADINE 10 MG PO TABS
10.0000 mg | ORAL_TABLET | Freq: Every day | ORAL | Status: DC
  Administered 2021-08-27 – 2021-08-28 (×2): 10 mg via ORAL
  Filled 2021-08-27 (×2): qty 1

## 2021-08-27 MED ORDER — MELATONIN 3 MG PO TABS
3.0000 mg | ORAL_TABLET | Freq: Every day | ORAL | Status: DC
  Administered 2021-08-27: 3 mg via ORAL
  Filled 2021-08-27: qty 1

## 2021-08-27 MED ORDER — AMLODIPINE BESYLATE 5 MG PO TABS
2.5000 mg | ORAL_TABLET | Freq: Every day | ORAL | Status: DC
  Administered 2021-08-27 – 2021-08-28 (×2): 2.5 mg via ORAL
  Filled 2021-08-27 (×2): qty 1

## 2021-08-27 MED ORDER — SODIUM CHLORIDE 0.9 % IV SOLN
2.0000 g | Freq: Two times a day (BID) | INTRAVENOUS | Status: DC
  Administered 2021-08-27: 2 g via INTRAVENOUS
  Filled 2021-08-27 (×2): qty 2

## 2021-08-27 MED ORDER — GUAIFENESIN-DM 100-10 MG/5ML PO SYRP
5.0000 mL | ORAL_SOLUTION | ORAL | Status: DC | PRN
  Administered 2021-08-27 – 2021-08-28 (×3): 5 mL via ORAL
  Filled 2021-08-27 (×3): qty 10

## 2021-08-27 MED ORDER — OXYCODONE HCL 5 MG PO TABS
5.0000 mg | ORAL_TABLET | Freq: Once | ORAL | Status: AC
  Administered 2021-08-27: 5 mg via ORAL
  Filled 2021-08-27: qty 1

## 2021-08-27 MED ORDER — CEFDINIR 300 MG PO CAPS: 300.0000 mg | ORAL_CAPSULE | Freq: Two times a day (BID) | ORAL | Status: DC

## 2021-08-27 MED ORDER — SALINE SPRAY 0.65 % NA SOLN: 1.0000 | NASAL | Status: DC | PRN

## 2021-08-27 NOTE — Progress Notes (Addendum)
Patient had >3 dark brown, mucous with bloody spot stool occurrences from her rectum.  Patient has ostomy producing over 100 ml formed stool a day.    This RN could not determine if discharge was from her vagina or rectum, but patient said she has had stool occurrences from her rectum for "a few months."  MD notified, ordered hemoccult collection.  Angie Fava, RN

## 2021-08-27 NOTE — Consult Note (Signed)
[facility] Nurse Consult Note: Reason for Consult: Patient with callus over right medial foot at first metatarsal head. Chronic, sees podiatrist in the community.  Next appointment is on 10/31. Also smaller callus on left foot, 5th digit. Wound type:neuropathic ulceration, full thickness Pressure Injury POA: N/A Measurement: Right medial foot, 4cm round callus with brown discoloration. No lifting at wound edge.  Left lateral foot with 1cm round callus. Wound bed:N/A Drainage (amount, consistency, odor) None Periwound: dry, flaking skin Dressing procedure/placement/frequency: I will provide Nursing with guidance for care via the orders using a soap and water cleanse, rinse and pat dry, followed by moisturizing with our house moisturizing cream, [place] 24.  The patient is to follow up with her podiatrist for routine paring of the calluses. Also recommended is the floatation of heels to prevent pressure injury.  [facility] nursing team will not follow, but will remain available to this patient, the nursing and medical teams.  Please re-consult if needed. Thanks, Maudie Flakes, MSN, RN, [place], Arther Abbott  Pager# [phone]

## 2021-08-27 NOTE — Progress Notes (Addendum)
Progress Note:    [name]    HYW:[id number] DOB: 08-Sep-1946 DOA: 08/25/2021  PCP: Everardo Beals, NP    Brief Narrative:   75 year old female with a past medical history of colorectal cancer currently undergoing chemotherapy, osteoarthritis, history of hemorrhagic basal ganglia stroke, chronic bilateral lower extremity edema, cervical cancer, chronic bronchitis, Graves' disease with hyperthyroidism status post RAI therapy and now hypothyroidism, recurrent UTIs on Keflex.  Presented to the emergency department after her daughter stated that she was more somnolent and altered.  Urinalysis was positive in the emergency department for UTI.     Subjective:   No acute events overnight.  Patient seen and examined this morning.  Son present at bedside. Not encephalopathic    Assessment and Plan:   Sepsis secondary to UTI: UA positive for UTI. Continue cefepime. WBC trending up: monitor closely. Bcx negative to date.   Hypothyroidism: Uncontrolled.  TSH 16.  Compliant with Synthroid.  Discontinued home Synthroid 100 mcg daily.  Start Synthroid 150 mcg daily during this hospitalization.  Repeat TSH in 6 weeks at PCP.  Acute on chronic anemia: status post 1 unit pRBC tranfusion. Initial hemoccult negative. Repeat hemoccult after nurse noted dark brown mucus and blood specks from rectum was positive. Will consult GI. Start PPI BID. Iron studies unremarkable. ?possible vaginal bleed mistaken for GI bleed given mucus appearance and not stool-like. Will order transabdominal pelvic US.  Colorectal cancer with history of partial colectomy and diverting colostomy: No acute treatment.  Lactic acidosis: Improved  Acute kidney injury: Resolved  Hypokalemia: Resolved  Metabolic acidosis: Resolved  Hypomagnesemia: Mag sulf 2g x1.  Repeat Mg in AM.  Tinea cruris: Continue Nystatin powder 3 times daily  Type 2 diabetes mellitus: A1c 7.7.  Relatively well-controlled given age.   Continue home [place].  Continue sliding scale insulin 3 times daily with meals.  CBG monitoring with meals and bedtime.  Continue carb controlled modified diet.  Hypertension: SBP 140s. Not on any antihypertensives at home. Start amlodipine 2.35m daily.  Coronary artery disease: Continue atorvostatin. Hold home aspirin and plavix due to GI bleed.  Dyslipidemia: Continue home atorvastatin and gemfibrozil    Other information:    DVT prophylaxis: SCDs Code Status: Full code Family Communication: Son present at bedside.  Discussed plan of care with daughter over the phone Disposition:   Status is: Inpatient  Remains inpatient appropriate because:Ongoing diagnostic testing needed not appropriate for outpatient work up  Dispo: The patient is from: Home              Anticipated d/c is to: Home              Patient currently is not medically stable to d/c.   Difficult to place patient No     Consultants:   GI    Objective:    Vitals:   08/26/21 2212 08/27/21 0137 08/27/21 0651 08/27/21 1201  BP: (!) 127/45 (!) 132/55 (!) 141/57 (!) 123/54  Pulse: 71 67 73 77  Resp: 16 16 18  (!) 24  Temp: 99.4 F (37.4 C) 99.9 F (37.7 C) 99.3 F (37.4 C) 98.1 F (36.7 C)  TempSrc: Oral Oral Oral Oral  SpO2: 97% 100% 98% 100%  Weight:      Height:        Intake/Output Summary (Last 24 hours) at 08/27/2021 1635 Last data filed at 08/27/2021 0849 Gross per 24 hour  Intake 740 ml  Output 1400 ml  Net -660 ml  Filed Weights   08/25/21 0656 08/25/21 1659  Weight: 66 kg 66.3 kg       Physical Exam:    General exam: Appears calm and comfortable  Respiratory system: Clear to auscultation. Respiratory effort normal. Cardiovascular system: S1 & S2 heard, RRR. No JVD, murmurs, rubs, gallops or clicks. No pedal edema. Gastrointestinal system: Abdomen is nondistended, soft and nontender. No organomegaly or masses felt. Normal bowel sounds heard. Ostomy appliance in place  with light brown stool. Central nervous system: Alert and oriented. No focal neurological deficits. Extremities: Symmetric 5 x 5 power. Skin: No rashes, lesions or ulcers Psychiatry: Judgement and insight appear normal. Mood & affect appropriate.     Data Reviewed:    I have personally reviewed following labs and imaging studies  CBC: Recent Labs  Lab 08/25/21 0727 08/26/21 0540 08/27/21 0410  WBC 9.7 8.8 14.1*  NEUTROABS 6.9 6.5 10.2*  HGB 8.0* 6.6* 8.7*  HCT 25.3* 21.3* 27.1*  MCV 87.8 91.8 88.6  PLT 105* 82* 91*     Basic Metabolic Panel: Recent Labs  Lab 08/25/21 0727 08/25/21 1022 08/25/21 1033 08/26/21 0350 08/27/21 0410  NA 142  --   --  141 138  K 2.8*  --   --  3.7 3.6  CL 106  --   --  112* 107  CO2 18*  --   --  23 22  GLUCOSE 183*  --   --  117* 117*  BUN 21  --   --  12 8  CREATININE 1.67*  --   --  1.05* 0.89  CALCIUM 8.3*  --   --  7.7* 7.7*  MG  --  1.0*  --   --  1.4*  PHOS  --   --  3.1  --   --      GFR: Estimated Creatinine Clearance: 46.4 mL/min (by C-G formula based on SCr of 0.89 mg/dL).  Liver Function Tests: Recent Labs  Lab 08/25/21 0727 08/26/21 0350 08/27/21 0410  AST 21 17 21   ALT 9 9 10   ALKPHOS 193* 142* 162*  BILITOT 0.4 0.5 1.0  PROT 5.9* 4.6* 4.9*  ALBUMIN 2.9* 2.2* 2.3*     CBG: Recent Labs  Lab 08/26/21 1140 08/26/21 1619 08/26/21 1952 08/27/21 0726 08/27/21 1127  GLUCAP 163* 145* 227* 109* 138*      Recent Results (from the past 240 hour(s))  Culture, blood (routine x 2)     Status: None (Preliminary result)   Collection Time: 08/25/21  5:14 PM   Specimen: BLOOD  Result Value Ref Range Status   Specimen Description   Final    BLOOD BLOOD RIGHT HAND Performed at [facility], [place] [address], [place], [place] [address]    Special Requests   Final    BOTTLES DRAWN AEROBIC ONLY Blood Culture results may not be optimal due to an inadequate volume of blood received in culture  bottles Performed at [place] [address], [place], [place] [address]    Culture   Final    NO GROWTH 2 DAYS Performed at [place] [address], [place],  [address]    Report Status PENDING  Incomplete  Culture, blood (routine x 2)     Status: None (Preliminary result)   Collection Time: 08/25/21  6:40 PM   Specimen: BLOOD  Result Value Ref Range Status   Specimen Description   Final    BLOOD BLOOD RIGHT WRIST  Performed at [facility], [place] [address] [place], [place] [address]    Special Requests   Final    BOTTLES DRAWN AEROBIC ONLY Blood Culture adequate volume Performed at [place] [address] [place], [place] [address]    Culture   Final    NO GROWTH 2 DAYS Performed at [facility] [address], [place], [place] [address]    Report Status PENDING  Incomplete  Urine Culture     Status: Abnormal   Collection Time: 08/25/21  6:53 PM   Specimen: Urine, Clean Catch  Result Value Ref Range Status   Specimen Description   Final    URINE, CLEAN CATCH Performed at [facility], [place] [address] [place], [place] [address]    Special Requests   Final    NONE Performed at [facility], [place] [address] [place], [place] [address]    Culture (A)  Final    <10,000 COLONIES/mL INSIGNIFICANT GROWTH Performed at [facility] [place] [address] [place], [place] [address]    Report Status 08/27/2021 FINAL  Final  Urine Culture     Status: Abnormal   Collection Time: 08/26/21  1:19 PM   Specimen: Urine, Clean Catch  Result Value Ref Range Status   Specimen Description   Final    URINE, CLEAN CATCH Performed at [facility], [place] [address], [place], [place] [address]    Special Requests   Final    NONE Performed at [facility], [place] [address], [place],  [address]    Culture MULTIPLE SPECIES  PRESENT, SUGGEST RECOLLECTION (A)  Final   Report Status 08/27/2021 FINAL  Final          Radiology Studies:    No results found.      Medications:    Scheduled Meds:  sodium chloride   Intravenous Once   amLODipine  2.5 mg Oral Daily   aspirin EC  81 mg Oral Daily   atorvastatin  40 mg Oral Daily   Chlorhexidine Gluconate Cloth  6 each Topical Daily   clopidogrel  75 mg Oral Daily   enoxaparin (LOVENOX) injection  40 mg Subcutaneous Daily   gemfibrozil  600 mg Oral BID AC   insulin aspart  0-15 Units Subcutaneous TID WC   levothyroxine  150 mcg Oral Q0600   loratadine  10 mg Oral Daily   nystatin   Topical TID   ofloxacin  2 drop Both Eyes QID   pantoprazole (PROTONIX) IV  40 mg Intravenous Q12H   sodium chloride flush  10-40 mL Intracatheter Q12H   Continuous Infusions:  ceFEPime (MAXIPIME) IV 2 g (08/27/21 0959)       LOS: 1 day    Time spent: 35 minutes    Leslee Home, MD [facility]   To contact the attending provider between 7A-7P or the covering provider during after hours 7P-7A, please log into the web site [email or web address] and access using universal [place] password for that web site. If you do not have the password, please call the hospital operator.  08/27/2021, 4:35 PM

## 2021-08-28 ENCOUNTER — Inpatient Hospital Stay: Payer: Medicare HMO

## 2021-08-28 DIAGNOSIS — A419 Sepsis, unspecified organism: Principal | ICD-10-CM

## 2021-08-28 DIAGNOSIS — N390 Urinary tract infection, site not specified: Secondary | ICD-10-CM

## 2021-08-28 LAB — CBC
HCT: 26.3 % — ABNORMAL LOW (ref 36.0–46.0)
Hemoglobin: 8.4 g/dL — ABNORMAL LOW (ref 12.0–15.0)
MCH: 28.5 pg (ref 26.0–34.0)
MCHC: 31.9 g/dL (ref 30.0–36.0)
MCV: 89.2 fL (ref 80.0–100.0)
Platelets: 91 10*3/uL — ABNORMAL LOW (ref 150–400)
RBC: 2.95 MIL/uL — ABNORMAL LOW (ref 3.87–5.11)
RDW: 19.8 % — ABNORMAL HIGH (ref 11.5–15.5)
WBC: 12.2 10*3/uL — ABNORMAL HIGH (ref 4.0–10.5)
nRBC: 1 % — ABNORMAL HIGH (ref 0.0–0.2)

## 2021-08-28 LAB — BASIC METABOLIC PANEL
Anion gap: 9 (ref 5–15)
BUN: 8 mg/dL (ref 8–23)
CO2: 22 mmol/L (ref 22–32)
Calcium: 7.9 mg/dL — ABNORMAL LOW (ref 8.9–10.3)
Chloride: 106 mmol/L (ref 98–111)
Creatinine, Ser: 0.74 mg/dL (ref 0.44–1.00)
GFR, Estimated: >60 mL/min (ref >60)
Glucose, Bld: 94 mg/dL (ref 70–99)
Potassium: 3.6 mmol/L (ref 3.5–5.1)
Sodium: 137 mmol/L (ref 135–145)

## 2021-08-28 LAB — GLUCOSE, CAPILLARY
Glucose-Capillary: 100 mg/dL — ABNORMAL HIGH (ref 70–99)
Glucose-Capillary: 105 mg/dL — ABNORMAL HIGH (ref 70–99)

## 2021-08-28 LAB — MAGNESIUM: Magnesium: 1.5 mg/dL — ABNORMAL LOW (ref 1.7–2.4)

## 2021-08-28 MED ORDER — TRESIBA FLEXTOUCH 100 UNIT/ML ~~LOC~~ SOPN: 10.0000 [IU] | PEN_INJECTOR | Freq: Every day | SUBCUTANEOUS | Status: DC

## 2021-08-28 MED ORDER — CLOPIDOGREL BISULFATE 75 MG PO TABS: 75.0000 mg | ORAL_TABLET | Freq: Every day | ORAL | 0 refills | Status: DC

## 2021-08-28 MED ORDER — LEVOTHYROXINE SODIUM 125 MCG PO TABS: 125.0000 ug | ORAL_TABLET | Freq: Every day | ORAL | 2 refills | Status: DC

## 2021-08-28 MED ORDER — FERROUS SULFATE 325 (65 FE) MG PO TABS: 325.0000 mg | ORAL_TABLET | Freq: Two times a day (BID) | ORAL | Status: DC

## 2021-08-28 MED ORDER — CEPHALEXIN 250 MG PO CAPS: 250.0000 mg | ORAL_CAPSULE | Freq: Every day | ORAL | Status: DC

## 2021-08-28 MED ORDER — SENNOSIDES-DOCUSATE SODIUM 8.6-50 MG PO TABS: 1.0000 | ORAL_TABLET | Freq: Two times a day (BID) | ORAL | 2 refills | Status: DC

## 2021-08-28 MED ORDER — MAGNESIUM SULFATE 4 GM/100ML IV SOLN
4.0000 g | Freq: Once | INTRAVENOUS | Status: AC
  Administered 2021-08-28: 4 g via INTRAVENOUS
  Filled 2021-08-28: qty 100

## 2021-08-28 MED ORDER — CIPROFLOXACIN HCL 500 MG PO TABS: 500.0000 mg | ORAL_TABLET | Freq: Two times a day (BID) | ORAL | 0 refills | Status: AC

## 2021-08-28 MED ORDER — FERROUS SULFATE 325 (65 FE) MG PO TABS: 325.0000 mg | ORAL_TABLET | Freq: Two times a day (BID) | ORAL | 2 refills | Status: DC

## 2021-08-28 MED ORDER — ASPIRIN 81 MG PO TBEC: 81.0000 mg | DELAYED_RELEASE_TABLET | Freq: Every day | ORAL | 1 refills | Status: DC

## 2021-08-28 MED ORDER — SENNOSIDES-DOCUSATE SODIUM 8.6-50 MG PO TABS: 1.0000 | ORAL_TABLET | Freq: Two times a day (BID) | ORAL | Status: DC

## 2021-08-28 MED ORDER — POTASSIUM CHLORIDE CRYS ER 20 MEQ PO TBCR
40.0000 meq | EXTENDED_RELEASE_TABLET | Freq: Once | ORAL | Status: AC
  Administered 2021-08-28: 40 meq via ORAL
  Filled 2021-08-28: qty 2

## 2021-08-28 MED ORDER — HEPARIN SOD (PORK) LOCK FLUSH 100 UNIT/ML IV SOLN
500.0000 [IU] | INTRAVENOUS | Status: AC | PRN
Start: 2021-08-28 — End: 2021-08-28
  Administered 2021-08-28: 500 [IU]
  Filled 2021-08-28: qty 5

## 2021-08-28 MED ORDER — CIPROFLOXACIN HCL 500 MG PO TABS
500.0000 mg | ORAL_TABLET | Freq: Two times a day (BID) | ORAL | Status: DC
  Administered 2021-08-28: 500 mg via ORAL
  Filled 2021-08-28: qty 1

## 2021-08-28 NOTE — Discharge Summary (Signed)
[facility]  Physician Discharge Summary   Patient ID: [name] MRN: [id number] DOB/AGE: 07/27/1946 75 y.o.  Admit date: 08/25/2021 Discharge date: 08/28/2021    PCP: Everardo Beals, NP  DISCHARGE DIAGNOSES:  Acute urinary tract infection with sepsis, present on admission Hypothyroidism Acute on chronic anemia due to chronic GI loss Colorectal carcinoma Rectal bleeding due to the cancer Acute kidney injury, resolved Diabetes mellitus type 2, controlled History of coronary artery disease  RECOMMENDATIONS FOR OUTPATIENT FOLLOW UP: Patient instructed to follow-up with her medical oncologist for blood work next week Thyroid function test to be repeated in 4 to 6 weeks.    Home Health: None Equipment/Devices: None   CODE STATUS: Full code  DISCHARGE CONDITION: fair  Diet recommendation: Modified carbohydrate  INITIAL HISTORY: 75 year old female with a past medical history of colorectal cancer currently undergoing chemotherapy, osteoarthritis, history of hemorrhagic basal ganglia stroke, chronic bilateral lower extremity edema, cervical cancer, chronic bronchitis, Graves' disease with hyperthyroidism status post RAI therapy and now hypothyroidism, recurrent UTIs on Keflex.  Presented to the emergency department after her daughter stated that she was more somnolent and altered.  Urinalysis was positive in the emergency department for UTI.  Consultations: Gastroenterology  Procedures: None    HOSPITAL COURSE:   Sepsis secondary to UTI: UA positive for UTI.  Initially placed on cefepime.  Cultures were all negative.  Patient was transitioned to ciprofloxacin based on previous culture data.     Hypothyroidism: TSH 16.  Compliant with Synthroid.  Synthroid dose was increased to 125 mcg daily.  Discontinued home Synthroid 100 mcg daily.  Start Synthroid 150 mcg daily during this hospitalization.  Repeat TSH in 4-6 weeks at PCP.   Normocytic anemia/chronic  blood loss anemia  Status post 1 unit pRBC tranfusion.  Patient noted to have intermittent episodes of hematochezia.  Previous notes reviewed.  This is from her rectal cancer.  Has been ongoing for a while.  Gastroenterology was consulted however they do not really have much to offer.  Discussed with patient and her daughter they understand.   Will need CBC monitored in the outpatient setting.  Will likely need transfusions intermittently.  Her medical oncologist is aware.   Colorectal cancer with history of partial colectomy and diverting colostomy Intermittent hematochezia noted as discussed above.   Lactic acidosis: Improved   Acute kidney injury: Resolved   Hypokalemia: Resolved   Metabolic acidosis: Resolved   Hypomagnesemia: Mag sulf 2g x1.  Repeat Mg in AM.   Tinea cruris: Continue Nystatin powder 3 times daily   Type 2 diabetes mellitus: A1c 7.7.  Relatively well-controlled given age.  Continue home medications   Essential hypertension: Monitor in the outpatient setting.  Will not treat for now.   Coronary artery disease: Stable.  Resume antiplatelet agents in a few days.   Dyslipidemia: Continue home atorvastatin and gemfibrozil   Pressure injury coccyx, stage I Pressure Injury 08/26/21 Coccyx Medial Stage 1 -  Intact skin with non-blanchable redness of a localized area usually over a bony prominence. Area of nonblanching skin (Active)  08/26/21 1141  Location: Coccyx  Location Orientation: Medial  Staging: Stage 1 -  Intact skin with non-blanchable redness of a localized area usually over a bony prominence.  Wound Description (Comments): Area of nonblanching skin  Present on Admission: Yes    Patient is stable.  Jennet Maduro on going home today.  Discussed with her daughter.  Okay for discharge.   PERTINENT LABS:  The results of significant diagnostics  from this hospitalization (including imaging, microbiology, ancillary and laboratory) are listed below for reference.     Microbiology: Recent Results (from the past 240 hour(s))  Culture, blood (routine x 2)     Status: None (Preliminary result)   Collection Time: 08/25/21  5:14 PM   Specimen: BLOOD  Result Value [place] Status   Specimen Description   Final    BLOOD BLOOD RIGHT HAND Performed at [facility], [place] [address] [place], [place] [address]    Special Requests   Final    BOTTLES DRAWN AEROBIC ONLY Blood Culture results may not be optimal due to an inadequate volume of blood received in culture bottles Performed at [place] [address], [place], [place] [address]    Culture   Final    NO GROWTH 4 DAYS Performed at [facility], [place] [address] [place], [place] [address]    Report Status PENDING  Incomplete  Culture, blood (routine x 2)     Status: None (Preliminary result)   Collection Time: 08/25/21  6:40 PM   Specimen: BLOOD  Result Value Ref [place] Status   Specimen Description   Final    BLOOD BLOOD RIGHT WRIST Performed at [facility] [address] [place], [place] [address]    Special Requests   Final    BOTTLES DRAWN AEROBIC ONLY Blood Culture adequate volume Performed at [facility] [address] [place], [place] [address]    Culture   Final    NO GROWTH 4 DAYS Performed at [facility], [place] [address], [place], [place] [address]    Report Status PENDING  Incomplete  Urine Culture     Status: Abnormal   Collection Time: 08/25/21  6:53 PM   Specimen: Urine, Clean Catch  Result Value Ref [place] Status   Specimen Description   Final    URINE, CLEAN CATCH Performed at [facility], [place] [address] [place], [place] [address]    Special Requests   Final    NONE Performed at [facility], [place] [address] [place], [place] [address]    Culture (A)  Final    <10,000 COLONIES/mL INSIGNIFICANT GROWTH Performed at [facility] [address] [place], [place] [address]    Report Status 08/27/2021 FINAL  Final  Urine Culture     Status: Abnormal   Collection Time: 08/26/21  1:19 PM   Specimen: Urine, Clean Catch  Result Value Ref [place] Status   Specimen Description   Final    URINE, CLEAN CATCH Performed at [facility], [place] [address] [place], [place] [address]    Special Requests   Final    NONE Performed at [facility], [place] [address] [place], [place] [address]    Culture MULTIPLE SPECIES PRESENT, SUGGEST RECOLLECTION (A)  Final   Report Status 08/27/2021 FINAL  Final  Resp Panel by RT-PCR (Flu A&B, Covid) Nasopharyngeal Swab     Status: None   Collection Time: 08/27/21  5:30 PM   Specimen: Nasopharyngeal Swab; Nasopharyngeal(NP) swabs in vial transport medium  Result Value Ref [place] Status   SARS Coronavirus 2 by RT PCR NEGATIVE NEGATIVE Final    Comment: (NOTE) SARS-CoV-2 target nucleic acids are NOT DETECTED.  The SARS-CoV-2 RNA is generally detectable in upper respiratory specimens during the acute phase of infection. The lowest concentration of SARS-CoV-2 viral copies this assay can  detect is 138 copies/mL. A negative result does not preclude SARS-Cov-2 infection and should not be used as the sole basis for treatment or other patient management decisions. A negative result may occur with  improper specimen collection/handling, submission of specimen other than nasopharyngeal swab, presence of viral mutation(s) within the areas targeted by this assay, and inadequate number of viral copies(<138 copies/mL). A negative result must be combined with clinical observations, patient history, and epidemiological information. The expected result is Negative.  Fact Sheet for Patients:  EntrepreneurPulse.com.au  Fact Sheet for Healthcare Providers:  IncredibleEmployment.be  This test is no t yet approved or cleared by the  [place] FDA and  has been authorized for detection and/or diagnosis of SARS-CoV-2 by FDA under an Emergency Use Authorization (EUA). This EUA will remain  in effect (meaning this test can be used) for the duration of the COVID-19 declaration under Section 564(b)(1) of the Act, 21 U.S.C.section 360bbb-3(b)(1), unless the authorization is terminated  or revoked sooner.       Influenza A by PCR NEGATIVE NEGATIVE Final   Influenza B by PCR NEGATIVE NEGATIVE Final    Comment: (NOTE) The Xpert Xpress SARS-CoV-2/FLU/RSV plus assay is intended as an aid in the diagnosis of influenza from Nasopharyngeal swab specimens and should not be used as a sole basis for treatment. Nasal washings and aspirates are unacceptable for Xpert Xpress SARS-CoV-2/FLU/RSV testing.  Fact Sheet for Patients: EntrepreneurPulse.com.au  Fact Sheet for Healthcare Providers: IncredibleEmployment.be  This test is not yet approved or cleared by the [place] FDA and has been authorized for detection and/or diagnosis of SARS-CoV-2 by FDA under an Emergency Use Authorization (EUA). This EUA will remain in effect (meaning this test can be used) for the duration of the COVID-19 declaration under Section 564(b)(1) of the Act, 21 U.S.C. section 360bbb-3(b)(1), unless the authorization is terminated or revoked.  Performed at [facility], [place] [address] [place], [place] [address]      Labs:  COVID-19 Labs  Recent Labs    08/26/21 1443  FERRITIN 264    Lab Results  Component Value Date   SARSCOV2NAA NEGATIVE 08/27/2021   [place] NEGATIVE 01/18/2021   [place] NEGATIVE 09/30/2020   [place] NEGATIVE 03/27/2020      Basic Metabolic Panel: Recent Labs  Lab 08/25/21 0727 08/25/21 1022 08/25/21 1033 08/26/21 0350 08/27/21 0410 08/28/21 0405  NA 142  --   --  141 138 137  K 2.8*  --   --  3.7 3.6 3.6  CL 106  --   --  112* 107  106  CO2 18*  --   --  23 22 22   GLUCOSE 183*  --   --  117* 117* 94  BUN 21  --   --  12 8 8   CREATININE 1.67*  --   --  1.05* 0.89 0.74  CALCIUM 8.3*  --   --  7.7* 7.7* 7.9*  MG  --  1.0*  --   --  1.4* 1.5*  PHOS  --   --  3.1  --   --   --    Liver Function Tests: Recent Labs  Lab 08/25/21 0727 08/26/21 0350 08/27/21 0410  AST 21 17 21   ALT 9 9 10   ALKPHOS 193* 142* 162*  BILITOT 0.4 0.5 1.0  PROT 5.9* 4.6* 4.9*  ALBUMIN 2.9* 2.2* 2.3*    CBC: Recent Labs  Lab 08/25/21 0727 08/26/21 0540 08/27/21 0410 08/28/21 0405  WBC 9.7  8.8 14.1* 12.2*  NEUTROABS 6.9 6.5 10.2*  --   HGB 8.0* 6.6* 8.7* 8.4*  HCT 25.3* 21.3* 27.1* 26.3*  MCV 87.8 91.8 88.6 89.2  PLT 105* 82* 91* 91*    CBG: Recent Labs  Lab 08/27/21 1729 08/27/21 1818 08/27/21 2057 08/28/21 0732 08/28/21 1111  GLUCAP 72 141* 134* 100* 105*     IMAGING STUDIES CT Head Wo Contrast  Result Date: 08/25/2021 CLINICAL DATA:  75 year old female with altered mental status, nausea and vomiting. Anal carcinoma. EXAM: CT HEAD WITHOUT CONTRAST TECHNIQUE: Contiguous axial images were obtained from the base of the skull through the vertex without intravenous contrast. COMPARISON:  Brain MRI 09/30/2020.  Head CT 09/30/2020. FINDINGS: Brain: Chronic lacunar infarct left corona radiata. Stable cerebral volume. No midline shift, ventriculomegaly, mass effect, evidence of mass lesion, intracranial hemorrhage or evidence of cortically based acute infarction. Stable gray-white matter differentiation. Vascular: Calcified atherosclerosis at the skull base. No suspicious intracranial vascular hyperdensity. Skull: No acute or suspicious osseous lesion. Sinuses/Orbits: Moderate new bilateral paranasal sinus mucosal thickening throughout. Trace sinus fluid. Tympanic cavities and mastoids remain clear. Other: No acute orbit or scalp soft tissue finding. Visible face soft tissues appear negative. IMPRESSION: 1. Moderate new bilateral  paranasal sinus inflammation. No complicating features identified. 2. No acute intracranial abnormality. Stable non contrast CT appearance of the brain since last year. Electronically Signed   By: Genevie Ann M.D.   On: 08/25/2021 08:14   US PELVIS (TRANSABDOMINAL ONLY)  Result Date: 08/27/2021 CLINICAL DATA:  Vaginal bleeding, history of cervical cancer EXAM: TRANSABDOMINAL ULTRASOUND OF PELVIS TECHNIQUE: Transabdominal ultrasound examination of the pelvis was performed including evaluation of the uterus, ovaries, adnexal regions, and pelvic cul-de-sac. COMPARISON:  01/18/2021, 01/23/2021 FINDINGS: Uterus Suboptimally evaluated due to empty bladder and adjacent bowel gas. Endometrium Not visualized. Right ovary Not visualized. Left ovary Not visualized. Other findings: Evaluation is suboptimal as the patient cannot fill her bladder and is incontinent. IMPRESSION: 1. Nondiagnostic pelvic ultrasound, as the pelvic viscera cannot be visualized due to incomplete bladder distension and bowel gas. Electronically Signed   By: Randa Ngo M.D.   On: 08/27/2021 20:23   DG Chest Port 1 View  Result Date: 08/25/2021 CLINICAL DATA:  75 year old female with history of altered mental status and vomiting. EXAM: PORTABLE CHEST 1 VIEW COMPARISON:  Chest x-ray 01/18/2021. FINDINGS: Right internal jugular single-lumen porta cath with tip terminating at the superior cavoatrial junction. Lung volumes are normal. No consolidative airspace disease. No pleural effusions. No pneumothorax. No pulmonary nodule or mass noted. Pulmonary vasculature and the cardiomediastinal silhouette are within normal limits. Atherosclerosis in the thoracic aorta. IMPRESSION: 1. No radiographic evidence of acute cardiopulmonary disease. 2. Aortic atherosclerosis. Electronically Signed   By: Vinnie Langton M.D.   On: 08/25/2021 07:45    DISCHARGE EXAMINATION: Vitals:   08/27/21 0651 08/27/21 1201 08/27/21 2059 08/28/21 0525  BP: (!) 141/57 (!)  123/54 (!) 141/46 (!) 113/49  Pulse: 73 77 81 72  Resp: 18 (!) 24 18 20   Temp: 99.3 F (37.4 C) 98.1 F (36.7 C) 100 F (37.8 C) 98.8 F (37.1 C)  TempSrc: Oral Oral Oral Oral  SpO2: 98% 100% 98% 97%  Weight:      Height:       General appearance: Awake alert.  In no distress Resp: Clear to auscultation bilaterally.  Normal effort Cardio: S1-S2 is normal regular.  No S3-S4.  No rubs murmurs or bruit GI: Abdomen is soft.  Nontender nondistended.  Bowel sounds are present normal.  No masses organomegaly    DISPOSITION: Home  Discharge Instructions     Call MD for:  extreme fatigue   Complete by: As directed    Call MD for:  persistant dizziness or light-headedness   Complete by: As directed    Call MD for:  persistant nausea and vomiting   Complete by: As directed    Call MD for:  severe uncontrolled pain   Complete by: As directed    Call MD for:  temperature >100.4   Complete by: As directed    Diet Carb Modified   Complete by: As directed    Discharge instructions   Complete by: As directed    Please take your medications as prescribed.  Please sure to follow-up with the cancer doctor in a week.  You will need blood work to check your hemoglobin as you may need the blood transfusions.  Please take your iron tablets as well.  Seek attention if you notice a significant worsening in the rectal bleeding.  You were cared for by a hospitalist during your hospital stay. If you have any questions about your discharge medications or the care you received while you were in the hospital after you are discharged, you can call the unit and asked to speak with the hospitalist on call if the hospitalist that took care of you is not available. Once you are discharged, your primary care physician will handle any further medical issues. Please note that NO REFILLS for any discharge medications will be authorized once you are discharged, as it is imperative that you return to your primary care  physician (or establish a relationship with a primary care physician if you do not have one) for your aftercare needs so that they can reassess your need for medications and monitor your lab values. If you do not have a primary care physician, you can call [phone] for a physician referral.   Discharge wound care:   Complete by: As directed    Wound care  Daily      Comments: Wound care to right medial foot, metatarsal head: wash feet with soap and water, rinse and pat dry. Apply moisturizing cream (Sween 24, pink topped tube [floor stock]) to affected area of callus. Apply nonskid socks and float heels.   Increase activity slowly   Complete by: As directed         Allergies as of 08/28/2021       Reactions   Codeine Nausea Only        Medication List     STOP taking these medications    sulfamethoxazole-trimethoprim 800-160 MG tablet Commonly known as: BACTRIM DS       TAKE these medications    acetaminophen 500 MG tablet Commonly known as: TYLENOL Take 500 mg by mouth every 6 (six) hours as needed for mild pain or fever.   aspirin 81 MG EC tablet Take 1 tablet (81 mg total) by mouth daily. RESUME AFTER 1 WEEK Start taking on: September 03, 2021 What changed:  additional instructions These instructions start on September 03, 2021. If you are unsure what to do until then, ask your doctor or other care provider.   atorvastatin 40 MG tablet Commonly known as: LIPITOR Take 40 mg by mouth daily.   baclofen 10 MG tablet Commonly known as: LIORESAL Take 10 mg by mouth 3 (three) times daily.   cephALEXin 250 MG capsule Commonly known as: KEFLEX Take 1 capsule (250  mg total) by mouth daily. RESUME AFTER YOU HAVE COMPLETED THE COURSE OF CIPRO What changed: additional instructions   ciprofloxacin 500 MG tablet Commonly known as: CIPRO Take 1 tablet (500 mg total) by mouth 2 (two) times daily for 4 days.   clopidogrel 75 MG tablet Commonly known as: PLAVIX Take 1  tablet (75 mg total) by mouth daily. RESUME AFTER 1 WEEK ON 09/03/21 Start taking on: September 03, 2021 What changed:  additional instructions These instructions start on September 03, 2021. If you are unsure what to do until then, ask your doctor or other care provider.   ergocalciferol 1.25 MG (50000 UT) capsule Commonly known as: VITAMIN D2 Take 50,000 Units by mouth every Sunday.   ferrous sulfate 325 (65 FE) MG tablet Take 1 tablet (325 mg total) by mouth 2 (two) times daily with a meal.   fluconazole 150 MG tablet Commonly known as: DIFLUCAN Take 150 mg by mouth See admin instructions. TAKE 1 TABLET BY MOUTH ONCE DAILY FOR 3 DAYS THEN 1 TABLET WEEKLY   gabapentin 300 MG capsule Commonly known as: NEURONTIN Take 300 mg by mouth at bedtime.   gemfibrozil 600 MG tablet Commonly known as: LOPID Take 600 mg by mouth 2 (two) times daily before a meal.   levothyroxine 125 MCG tablet Commonly known as: SYNTHROID Take 1 tablet (125 mcg total) by mouth daily at 6 (six) AM. What changed:  medication strength how much to take when to take this   lidocaine-prilocaine cream Commonly known as: EMLA Apply to port site 1-2 hours prior to use   metFORMIN 500 MG tablet Commonly known as: GLUCOPHAGE Take 500 mg by mouth daily with breakfast.   methocarbamol 500 MG tablet Commonly known as: ROBAXIN Take 500 mg by mouth 2 (two) times daily as needed for muscle spasms.   nystatin cream Commonly known as: MYCOSTATIN Apply 1 application topically 2 (two) times daily. Mix with triamcinolone cream to groin rash   nystatin powder Commonly known as: MYCOSTATIN/NYSTOP Apply 1 application topically 2 (two) times daily. To groin rash   ofloxacin 0.3 % ophthalmic solution Commonly known as: OCUFLOX Place 1 drop into both eyes 4 (four) times daily.   OneTouch Delica Plus [id number] Misc Apply topically 2 (two) times daily.   OneTouch Verio test strip Generic drug: glucose blood 1 each 2  (two) times daily.   oxybutynin 10 MG 24 hr tablet Commonly known as: DITROPAN-XL Take 10 mg by mouth daily.   polyethylene glycol powder 17 GM/SCOOP powder Commonly known as: GLYCOLAX/MIRALAX Take 1 Container by mouth daily.   potassium chloride SA 20 MEQ tablet Commonly known as: KLOR-CON Take 1 tablet (20 mEq total) by mouth daily.   senna-docusate 8.6-50 MG tablet Commonly known as: Senokot-S Take 1 tablet by mouth 2 (two) times daily.   sertraline 25 MG tablet Commonly known as: ZOLOFT Take 25 mg by mouth daily.   Tyler Aas FlexTouch 100 UNIT/ML FlexTouch Pen Generic drug: insulin degludec Inject 10 Units into the skin daily. What changed: how much to take   triamcinolone cream 0.1 % Commonly known as: KENALOG Apply 1 application topically daily as needed (vaginal itching). Mix with nystatin cream   Xtampza ER 9 MG C12a Generic drug: oxyCODONE ER Take 9 mg by mouth every 12 (twelve) hours.               Discharge Care Instructions  (From admission, onward)           Start  Ordered   08/28/21 0000  Discharge wound care:       Comments: Wound care  Daily      Comments: Wound care to right medial foot, metatarsal head: wash feet with soap and water, rinse and pat dry. Apply moisturizing cream (Sween 24, pink topped tube [floor stock]) to affected area of callus. Apply nonskid socks and float heels.   08/28/21 1941              Follow-up Information     Ladell Pier, MD Follow up.   Specialty: Oncology Contact information: [place] [address] [phone]                 TOTAL DISCHARGE TIME: 22 minutes  [facility] Pager on [email or web address]  08/29/2021, 11:44 AM

## 2021-08-29 DIAGNOSIS — C20 Malignant neoplasm of rectum: Secondary | ICD-10-CM | POA: Diagnosis not present

## 2021-08-29 DIAGNOSIS — Z933 Colostomy status: Secondary | ICD-10-CM | POA: Diagnosis not present

## 2021-08-30 LAB — CULTURE, BLOOD (ROUTINE X 2)
Culture: NO GROWTH
Culture: NO GROWTH
Special Requests: ADEQUATE

## 2021-09-02 ENCOUNTER — Telehealth: Payer: Self-pay

## 2021-09-02 ENCOUNTER — Inpatient Hospital Stay: Payer: Medicare HMO

## 2021-09-02 ENCOUNTER — Inpatient Hospital Stay: Payer: Medicare HMO | Attending: Nurse Practitioner | Admitting: Oncology

## 2021-09-02 ENCOUNTER — Other Ambulatory Visit: Payer: Self-pay

## 2021-09-02 VITALS — BP 133/52 | HR 76 | Temp 98.1°F | Resp 20 | Ht 60.0 in | Wt 146.0 lb

## 2021-09-02 DIAGNOSIS — C20 Malignant neoplasm of rectum: Secondary | ICD-10-CM | POA: Diagnosis not present

## 2021-09-02 DIAGNOSIS — Z5111 Encounter for antineoplastic chemotherapy: Secondary | ICD-10-CM | POA: Diagnosis present

## 2021-09-02 DIAGNOSIS — D6481 Anemia due to antineoplastic chemotherapy: Secondary | ICD-10-CM | POA: Insufficient documentation

## 2021-09-02 LAB — CMP (CANCER CENTER ONLY)
ALT: 9 U/L (ref 0–44)
AST: 13 U/L — ABNORMAL LOW (ref 15–41)
Albumin: 3.1 g/dL — ABNORMAL LOW (ref 3.5–5.0)
Alkaline Phosphatase: 174 U/L — ABNORMAL HIGH (ref 38–126)
Anion gap: 12 (ref 5–15)
BUN: 8 mg/dL (ref 8–23)
CO2: 21 mmol/L — ABNORMAL LOW (ref 22–32)
Calcium: 8.1 mg/dL — ABNORMAL LOW (ref 8.9–10.3)
Chloride: 103 mmol/L (ref 98–111)
Creatinine: 0.78 mg/dL (ref 0.44–1.00)
GFR, Estimated: >60 mL/min (ref >60)
Glucose, Bld: 223 mg/dL — ABNORMAL HIGH (ref 70–99)
Potassium: 3.8 mmol/L (ref 3.5–5.1)
Sodium: 136 mmol/L (ref 135–145)
Total Bilirubin: 0.5 mg/dL (ref 0.3–1.2)
Total Protein: 5.9 g/dL — ABNORMAL LOW (ref 6.5–8.1)

## 2021-09-02 LAB — CBC WITH DIFFERENTIAL (CANCER CENTER ONLY)
Abs Immature Granulocytes: 0.47 10*3/uL — ABNORMAL HIGH (ref 0.00–0.07)
Basophils Absolute: 0.1 10*3/uL (ref 0.0–0.1)
Basophils Relative: 1 %
Eosinophils Absolute: 0.1 10*3/uL (ref 0.0–0.5)
Eosinophils Relative: 1 %
HCT: 27.6 % — ABNORMAL LOW (ref 36.0–46.0)
Hemoglobin: 8.7 g/dL — ABNORMAL LOW (ref 12.0–15.0)
Immature Granulocytes: 5 %
Lymphocytes Relative: 12 %
Lymphs Abs: 1.2 10*3/uL (ref 0.7–4.0)
MCH: 28.1 pg (ref 26.0–34.0)
MCHC: 31.5 g/dL (ref 30.0–36.0)
MCV: 89 fL (ref 80.0–100.0)
Monocytes Absolute: 1.3 10*3/uL — ABNORMAL HIGH (ref 0.1–1.0)
Monocytes Relative: 13 %
Neutro Abs: 7.3 10*3/uL (ref 1.7–7.7)
Neutrophils Relative %: 68 %
Platelet Count: 198 10*3/uL (ref 150–400)
RBC: 3.1 MIL/uL — ABNORMAL LOW (ref 3.87–5.11)
RDW: 22 % — ABNORMAL HIGH (ref 11.5–15.5)
WBC Count: 10.4 10*3/uL (ref 4.0–10.5)
nRBC: 0.2 % (ref 0.0–0.2)

## 2021-09-02 LAB — CEA (ACCESS): CEA (CHCC): 9.96 ng/mL — ABNORMAL HIGH (ref 0.00–5.00)

## 2021-09-02 MED ORDER — OXALIPLATIN CHEMO INJECTION 100 MG/20ML
55.0000 mg/m2 | Freq: Once | INTRAVENOUS | Status: AC
  Administered 2021-09-02: 90 mg via INTRAVENOUS
  Filled 2021-09-02: qty 18

## 2021-09-02 MED ORDER — LEUCOVORIN CALCIUM INJECTION 350 MG
400.0000 mg/m2 | Freq: Once | INTRAVENOUS | Status: AC
  Administered 2021-09-02: 672 mg via INTRAVENOUS
  Filled 2021-09-02: qty 33.6

## 2021-09-02 MED ORDER — PALONOSETRON HCL INJECTION 0.25 MG/5ML
0.2500 mg | Freq: Once | INTRAVENOUS | Status: AC
  Administered 2021-09-02: 0.25 mg via INTRAVENOUS
  Filled 2021-09-02: qty 5

## 2021-09-02 MED ORDER — FLUOROURACIL CHEMO INJECTION 2.5 GM/50ML
400.0000 mg/m2 | Freq: Once | INTRAVENOUS | Status: AC
  Administered 2021-09-02: 650 mg via INTRAVENOUS
  Filled 2021-09-02: qty 13

## 2021-09-02 MED ORDER — DEXTROSE 5 % IV SOLN
Freq: Once | INTRAVENOUS | Status: AC
Start: 2021-09-02 — End: 2021-09-02

## 2021-09-02 MED ORDER — DEXTROSE 5 % IV SOLN: Freq: Once | INTRAVENOUS | Status: AC

## 2021-09-02 MED ORDER — SODIUM CHLORIDE 0.9 % IV SOLN
10.0000 mg | Freq: Once | INTRAVENOUS | Status: AC
  Administered 2021-09-02: 10 mg via INTRAVENOUS
  Filled 2021-09-02: qty 1

## 2021-09-02 MED ORDER — SODIUM CHLORIDE 0.9 % IV SOLN
2000.0000 mg/m2 | INTRAVENOUS | Status: DC
  Administered 2021-09-02: 3350 mg via INTRAVENOUS
  Filled 2021-09-02: qty 67

## 2021-09-02 NOTE — Progress Notes (Signed)
Patient presents for treatment. RN assessment completed along with the following:  Labs/vitals reviewed - Yes, and within treatment parameters.   Weight within 10% of previous measurement - Yes Oncology Treatment Attestation completed for current therapy- Yes, on date 07/01/21 Informed consent completed and reflects current therapy/intent - Yes, on date 07/08/21             Provider progress note reviewed - Yes, today's provider note was reviewed. Treatment/Antibody/Supportive plan reviewed - Yes, and there are no adjustments needed for today's treatment. S&H and other orders reviewed - Yes, and there are no additional orders identified. Previous treatment date reviewed - Yes, and the appropriate amount of time has elapsed between treatments. Clinic Hand Off Received from - none   Patient to proceed with treatment.

## 2021-09-02 NOTE — Progress Notes (Signed)
[facility] OFFICE PROGRESS NOTE   Diagnosis: Rectal cancer  INTERVAL HISTORY:   [name] was last treated with FOLFOX on 08/12/2021.  No nausea/vomiting, mouth sores, or diarrhea.  She has cold sensitivity following chemotherapy.  No peripheral neuropathy symptoms. She complains of irritation of the skin over the labia and perineum.  She is using nystatin powder and cream. [name] was admitted on 08/25/2021 with a urinary tract infection.  She continues to have burning with urination, but this has improved.  Objective:  Vital signs in last 24 hours:  Blood pressure (!) 133/52, pulse 76, temperature 98.1 F (36.7 C), temperature source Oral, resp. rate 20, height 5' (1.524 m), weight 146 lb (66.2 kg), SpO2 98 %.    HEENT: Thrush at the tongue and buccal mucosa Resp: End expiratory wheeze at the lower posterior chest bilaterally, no respiratory distress Cardio: Regular rate and rhythm GI: No hepatomegaly, left lower quadrant colostomy with brown stool Vascular: No leg edema  Skin: Erythema at the labia and perineum.  No skin breakdown.,  Palms and soles without erythema  Portacath/PICC-without erythema  Lab Results:  Lab Results  Component Value Date   WBC 10.4 09/02/2021   HGB 8.7 (L) 09/02/2021   HCT 27.6 (L) 09/02/2021   MCV 89.0 09/02/2021   PLT 198 09/02/2021   NEUTROABS 7.3 09/02/2021    CMP  Lab Results  Component Value Date   NA 136 09/02/2021   K 3.8 09/02/2021   CL 103 09/02/2021   CO2 21 (L) 09/02/2021   GLUCOSE 223 (H) 09/02/2021   BUN 8 09/02/2021   CREATININE 0.78 09/02/2021   CALCIUM 8.1 (L) 09/02/2021   PROT 5.9 (L) 09/02/2021   ALBUMIN 3.1 (L) 09/02/2021   AST 13 (L) 09/02/2021   ALT 9 09/02/2021   ALKPHOS 174 (H) 09/02/2021   BILITOT 0.5 09/02/2021   GFRNONAA >60 09/02/2021   GFRAA >60 03/29/2020    Lab Results  Component Value Date   CEA1 28.60 (H) 06/12/2021   CEA 31.01 (H) 06/12/2021     Medications: I have  reviewed the patient's current medications.   Assessment/Plan: Rectal cancer Partially obstructing mass beginning at 10 cm from the anal verge on colonoscopy 01/23/2021, biopsy-adenocarcinoma, immunohistochemical stains at [facility]-CK20, CDX-2, and SATB2 positive, patchy strong staining for p16.  The rectal tumor has a distinct immunohistochemical profile suggesting a primary colorectal tumor as opposed to metastatic cervical cancer. CT abdomen/pelvis 01/18/2021-bilateral hydronephrosis, endometrial fluid collection, prominent stool in the colon MRI pelvis 01/23/2021-tumor at 7.6 cm from the anal verge, abnormal signal bridge in the cervix, mesorectum, and anterior aspect of the rectum-similar to MRI from 2020, MRI stage could not be defined secondary to posttreatment changes in the pelvis 03/18/2021-total abdominal hysterectomy, cystoscopy with insertion of ureteral stents, exploratory laparotomy, aborted low anterior resection, descending loop colostomy--right and left fallopian tubes and ovaries negative for malignancy; excision portion of cervix positive for adenocarcinoma; uterus with invasive moderately differentiated adenocarcinoma of the cervix, HPV associated, invading through full-thickness of the cervix/lower uterine segment and into parametrial tissue, margins of resection received disrupted, cannot be evaluated; remaining uterus with extensive endocervical adenocarcinoma in situ involving lower uterine segment and replacing endometrium.  This carcinoma was felt to be a separate primary from the rectal tumor. PET scan at [facility] 04/11/2021-intense FDG activity in region of known rectal mass.  Hypermetabolic left external iliac and right inguinal lymph nodes.  Minimal FDG uptake and small right supraclavicular lymph node.  No uptake  seen in the cervix. 04/23/2021 FNA right inguinal lymph node-negative for malignancy Evaluated by Dr. Leamon Arnt 05/21/2021, 05/28/2021-appears to have 2 synchronous malignancies (cervical  adenocarcinoma and rectal adenocarcinoma); further surgery which would entail pelvic exenteration not recommended by gynecologic oncology or colorectal surgery.  Full doses of radiotherapy not felt to be feasible.  Recommended 3 months of CAPOX and then repeat MRI of abdomen/pelvis, chest CT and colonoscopy. Cycle 1 Xeloda 06/13/2021 Cycle 1 FOLFOX 07/08/2021, oxaliplatin dose reduced secondary to pre-existing neuropathy Cycle 2 FOLFOX 07/29/2021, Udenyca Cycle 3 FOLFOX 08/12/2021, Udenyca     Bilateral hydronephrosis-secondary to urinary retention? Hematometra found on pelvic MRI 01/23/2021, evaluated by gynecologic oncology Cervical cancer November 1994, stage Ib, treated with external beam radiation and brachytherapy at [facility] Diabetes Neuropathy Chronic pain secondary to #6 Recurrent urinary tract infections History of a CVA History of hyperthyroidism G3, P3 COVID-19 infection January 2021 Admission 08/25/2021 with a urinary tract infection-culture negative Anemia secondary to chronic disease, chemotherapy, and rectal bleeding-1 unit packed red blood cells 08/26/2021      Disposition: [name] is being treated with FOLFOX chemotherapy for rectal cancer and a history of locally recurrent cervical cancer.  She has completed 3 cycles of chemotherapy.  She has tolerated the chemotherapy well.  She will complete cycle 4 today.  She will continue antibiotics for recurrent urinary tract infections.  She has oral candidiasis.  She will complete a course of Diflucan.  The erythema at the labia and perineum may be related to contact dermatitis, yeast, or chemotherapy.  She will complete the course of Diflucan and use a barrier cream.  [name] will return for an office visit and chemotherapy in 2 weeks.  She will undergo restaging after cycle 6.  Betsy Coder, MD  09/02/2021  9:30 AM

## 2021-09-02 NOTE — Telephone Encounter (Signed)
Patient seen by Dr. Sherrill today ? ?Vitals are within treatment parameters. ? ?Labs reviewed by Dr. Sherrill and are within treatment parameters. ? ?Per physician team, patient is ready for treatment and there are NO modifications to the treatment plan.  ?

## 2021-09-02 NOTE — Patient Instructions (Signed)
[facility]  Discharge Instructions: Thank you for choosing [facility] to provide your oncology and hematology care.   If you have a lab appointment with the [facility], please go directly to the [facility] and check in at the registration area.   Wear comfortable clothing and clothing appropriate for easy access to any Portacath or PICC line.   We strive to give you quality time with your provider. You may need to reschedule your appointment if you arrive late (15 or more minutes).  Arriving late affects you and other patients whose appointments are after yours.  Also, if you miss three or more appointments without notifying the office, you may be dismissed from the clinic at the provider's discretion.      For prescription refill requests, have your pharmacy contact our office and allow 72 hours for refills to be completed.    Today you received the following chemotherapy and/or immunotherapy agents oxaliplatin, leucovorin, fluorouracil      To help prevent nausea and vomiting after your treatment, we encourage you to take your nausea medication as directed.  BELOW ARE SYMPTOMS THAT SHOULD BE REPORTED IMMEDIATELY: *FEVER GREATER THAN 100.4 F (38 C) OR HIGHER *CHILLS OR SWEATING *NAUSEA AND VOMITING THAT IS NOT CONTROLLED WITH YOUR NAUSEA MEDICATION *UNUSUAL SHORTNESS OF BREATH *UNUSUAL BRUISING OR BLEEDING *URINARY PROBLEMS (pain or burning when urinating, or frequent urination) *BOWEL PROBLEMS (unusual diarrhea, constipation, pain near the anus) TENDERNESS IN MOUTH AND THROAT WITH OR WITHOUT PRESENCE OF ULCERS (sore throat, sores in mouth, or a toothache) UNUSUAL RASH, SWELLING OR PAIN  UNUSUAL VAGINAL DISCHARGE OR ITCHING   Items with * indicate a potential emergency and should be followed up as soon as possible or go to the Emergency Department if any problems should occur.  Please show the CHEMOTHERAPY ALERT CARD or IMMUNOTHERAPY  ALERT CARD at check-in to the Emergency Department and triage nurse.  Should you have questions after your visit or need to cancel or reschedule your appointment, please contact [facility]  Dept: [phone]  and follow the prompts.  Office hours are 8:00 a.m. to 4:30 p.m. Monday - Friday. Please note that voicemails left after 4:00 p.m. may not be returned until the following business day.  We are closed weekends and major holidays. You have access to a nurse at all times for urgent questions. Please call the main number to the clinic Dept: [phone] and follow the prompts.   For any non-urgent questions, you may also contact your provider using MyChart. We now offer e-Visits for anyone 76 and older to request care online for non-urgent symptoms. For details visit [email or web address].   Also download the MyChart app! Go to the app store, search "MyChart", open the app, select [facility], and log in with your MyChart username and password.  Due to Covid, a mask is required upon entering the hospital/clinic. If you do not have a mask, one will be given to you upon arrival. For doctor visits, patients may have 1 support person aged 34 or older with them. For treatment visits, patients cannot have anyone with them due to current Covid guidelines and our immunocompromised population.   Oxaliplatin Injection What is this medication? OXALIPLATIN (ox AL i PLA tin) is a chemotherapy drug. It targets fast dividing cells, like cancer cells, and causes these cells to die. This medicine is used to treat cancers of the colon and rectum, and many other cancers. This  medicine may be used for other purposes; ask your health care provider or pharmacist if you have questions. COMMON BRAND NAME(S): Eloxatin What should I tell my care team before I take this medication? They need to know if you have any of these conditions: heart disease history of irregular heartbeat liver  disease low blood counts, like white cells, platelets, or red blood cells lung or breathing disease, like asthma take medicines that treat or prevent blood clots tingling of the fingers or toes, or other nerve disorder an unusual or allergic reaction to oxaliplatin, other chemotherapy, other medicines, foods, dyes, or preservatives pregnant or trying to get pregnant breast-feeding How should I use this medication? This drug is given as an infusion into a vein. It is administered in a hospital or clinic by a specially trained health care professional. Talk to your pediatrician regarding the use of this medicine in children. Special care may be needed. Overdosage: If you think you have taken too much of this medicine contact a poison control center or emergency room at once. NOTE: This medicine is only for you. Do not share this medicine with others. What if I miss a dose? It is important not to miss a dose. Call your doctor or health care professional if you are unable to keep an appointment. What may interact with this medication? Do not take this medicine with any of the following medications: cisapride dronedarone pimozide thioridazine This medicine may also interact with the following medications: aspirin and aspirin-like medicines certain medicines that treat or prevent blood clots like warfarin, apixaban, dabigatran, and rivaroxaban cisplatin cyclosporine diuretics medicines for infection like acyclovir, adefovir, amphotericin B, bacitracin, cidofovir, foscarnet, ganciclovir, gentamicin, pentamidine, vancomycin NSAIDs, medicines for pain and inflammation, like ibuprofen or naproxen other medicines that prolong the QT interval (an abnormal heart rhythm) pamidronate zoledronic acid This list may not describe all possible interactions. Give your health care provider a list of all the medicines, herbs, non-prescription drugs, or dietary supplements you use. Also tell them if you  smoke, drink alcohol, or use illegal drugs. Some items may interact with your medicine. What should I watch for while using this medication? Your condition will be monitored carefully while you are receiving this medicine. You may need blood work done while you are taking this medicine. This medicine may make you feel generally unwell. This is not uncommon as chemotherapy can affect healthy cells as well as cancer cells. Report any side effects. Continue your course of treatment even though you feel ill unless your healthcare professional tells you to stop. This medicine can make you more sensitive to cold. Do not drink cold drinks or use ice. Cover exposed skin before coming in contact with cold temperatures or cold objects. When out in cold weather wear warm clothing and cover your mouth and nose to warm the air that goes into your lungs. Tell your doctor if you get sensitive to the cold. Do not become pregnant while taking this medicine or for 9 months after stopping it. Women should inform their health care professional if they wish to become pregnant or think they might be pregnant. Men should not father a child while taking this medicine and for 6 months after stopping it. There is potential for serious side effects to an unborn child. Talk to your health care professional for more information. Do not breast-feed a child while taking this medicine or for 3 months after stopping it. This medicine has caused ovarian failure in some women. This medicine  may make it more difficult to get pregnant. Talk to your health care professional if you are concerned about your fertility. This medicine has caused decreased sperm counts in some men. This may make it more difficult to father a child. Talk to your health care professional if you are concerned about your fertility. This medicine may increase your risk of getting an infection. Call your health care professional for advice if you get a fever, chills, or  sore throat, or other symptoms of a cold or flu. Do not treat yourself. Try to avoid being around people who are sick. Avoid taking medicines that contain aspirin, acetaminophen, ibuprofen, naproxen, or ketoprofen unless instructed by your health care professional. These medicines may hide a fever. Be careful brushing or flossing your teeth or using a toothpick because you may get an infection or bleed more easily. If you have any dental work done, tell your dentist you are receiving this medicine. What side effects may I notice from receiving this medication? Side effects that you should report to your doctor or health care professional as soon as possible: allergic reactions like skin rash, itching or hives, swelling of the face, lips, or tongue breathing problems cough low blood counts - this medicine may decrease the number of white blood cells, red blood cells, and platelets. You may be at increased risk for infections and bleeding nausea, vomiting pain, redness, or irritation at site where injected pain, tingling, numbness in the hands or feet signs and symptoms of bleeding such as bloody or black, tarry stools; red or dark brown urine; spitting up blood or brown material that looks like coffee grounds; red spots on the skin; unusual bruising or bleeding from the eyes, gums, or nose signs and symptoms of a dangerous change in heartbeat or heart rhythm like chest pain; dizziness; fast, irregular heartbeat; palpitations; feeling faint or lightheaded; falls signs and symptoms of infection like fever; chills; cough; sore throat; pain or trouble passing urine signs and symptoms of liver injury like dark yellow or brown urine; general ill feeling or flu-like symptoms; light-colored stools; loss of appetite; nausea; right upper belly pain; unusually weak or tired; yellowing of the eyes or skin signs and symptoms of low red blood cells or anemia such as unusually weak or tired; feeling faint or  lightheaded; falls signs and symptoms of muscle injury like dark urine; trouble passing urine or change in the amount of urine; unusually weak or tired; muscle pain; back pain Side effects that usually do not require medical attention (report to your doctor or health care professional if they continue or are bothersome): changes in taste diarrhea gas hair loss loss of appetite mouth sores This list may not describe all possible side effects. Call your doctor for medical advice about side effects. You may report side effects to FDA at 1-800-FDA-1088. Where should I keep my medication? This drug is given in a hospital or clinic and will not be stored at home. NOTE: This sheet is a summary. It may not cover all possible information. If you have questions about this medicine, talk to your doctor, pharmacist, or health care provider.  2022 Elsevier/Gold Standard (2019-03-23 12:20:35)  Leucovorin injection What is this medication? LEUCOVORIN (loo koe VOR in) is used to prevent or treat the harmful effects of some medicines. This medicine is used to treat anemia caused by a low amount of folic acid in the body. It is also used with 5-fluorouracil (5-FU) to treat colon cancer. This medicine may  be used for other purposes; ask your health care provider or pharmacist if you have questions. What should I tell my care team before I take this medication? They need to know if you have any of these conditions: anemia from low levels of vitamin B-12 in the blood an unusual or allergic reaction to leucovorin, folic acid, other medicines, foods, dyes, or preservatives pregnant or trying to get pregnant breast-feeding How should I use this medication? This medicine is for injection into a muscle or into a vein. It is given by a health care professional in a hospital or clinic setting. Talk to your pediatrician regarding the use of this medicine in children. Special care may be needed. Overdosage: If you  think you have taken too much of this medicine contact a poison control center or emergency room at once. NOTE: This medicine is only for you. Do not share this medicine with others. What if I miss a dose? This does not apply. What may interact with this medication? capecitabine fluorouracil phenobarbital phenytoin primidone trimethoprim-sulfamethoxazole This list may not describe all possible interactions. Give your health care provider a list of all the medicines, herbs, non-prescription drugs, or dietary supplements you use. Also tell them if you smoke, drink alcohol, or use illegal drugs. Some items may interact with your medicine. What should I watch for while using this medication? Your condition will be monitored carefully while you are receiving this medicine. This medicine may increase the side effects of 5-fluorouracil, 5-FU. Tell your doctor or health care professional if you have diarrhea or mouth sores that do not get better or that get worse. What side effects may I notice from receiving this medication? Side effects that you should report to your doctor or health care professional as soon as possible: allergic reactions like skin rash, itching or hives, swelling of the face, lips, or tongue breathing problems fever, infection mouth sores unusual bleeding or bruising unusually weak or tired Side effects that usually do not require medical attention (report to your doctor or health care professional if they continue or are bothersome): constipation or diarrhea loss of appetite nausea, vomiting This list may not describe all possible side effects. Call your doctor for medical advice about side effects. You may report side effects to FDA at 1-800-FDA-1088. Where should I keep my medication? This drug is given in a hospital or clinic and will not be stored at home. NOTE: This sheet is a summary. It may not cover all possible information. If you have questions about this  medicine, talk to your doctor, pharmacist, or health care provider.  2022 Elsevier/Gold Standard (2008-05-09 16:50:29)  Fluorouracil, 5-FU injection What is this medication? FLUOROURACIL, 5-FU (flure oh YOOR a sil) is a chemotherapy drug. It slows the growth of cancer cells. This medicine is used to treat many types of cancer like breast cancer, colon or rectal cancer, pancreatic cancer, and stomach cancer. This medicine may be used for other purposes; ask your health care provider or pharmacist if you have questions. COMMON BRAND NAME(S): Adrucil What should I tell my care team before I take this medication? They need to know if you have any of these conditions: blood disorders dihydropyrimidine dehydrogenase (DPD) deficiency infection (especially a virus infection such as chickenpox, cold sores, or herpes) kidney disease liver disease malnourished, poor nutrition recent or ongoing radiation therapy an unusual or allergic reaction to fluorouracil, other chemotherapy, other medicines, foods, dyes, or preservatives pregnant or trying to get pregnant breast-feeding How should I  use this medication? This drug is given as an infusion or injection into a vein. It is administered in a hospital or clinic by a specially trained health care professional. Talk to your pediatrician regarding the use of this medicine in children. Special care may be needed. Overdosage: If you think you have taken too much of this medicine contact a poison control center or emergency room at once. NOTE: This medicine is only for you. Do not share this medicine with others. What if I miss a dose? It is important not to miss your dose. Call your doctor or health care professional if you are unable to keep an appointment. What may interact with this medication? Do not take this medicine with any of the following medications: live virus vaccines This medicine may also interact with the following medications: medicines  that treat or prevent blood clots like warfarin, enoxaparin, and dalteparin This list may not describe all possible interactions. Give your health care provider a list of all the medicines, herbs, non-prescription drugs, or dietary supplements you use. Also tell them if you smoke, drink alcohol, or use illegal drugs. Some items may interact with your medicine. What should I watch for while using this medication? Visit your doctor for checks on your progress. This drug may make you feel generally unwell. This is not uncommon, as chemotherapy can affect healthy cells as well as cancer cells. Report any side effects. Continue your course of treatment even though you feel ill unless your doctor tells you to stop. In some cases, you may be given additional medicines to help with side effects. Follow all directions for their use. Call your doctor or health care professional for advice if you get a fever, chills or sore throat, or other symptoms of a cold or flu. Do not treat yourself. This drug decreases your body's ability to fight infections. Try to avoid being around people who are sick. This medicine may increase your risk to bruise or bleed. Call your doctor or health care professional if you notice any unusual bleeding. Be careful brushing and flossing your teeth or using a toothpick because you may get an infection or bleed more easily. If you have any dental work done, tell your dentist you are receiving this medicine. Avoid taking products that contain aspirin, acetaminophen, ibuprofen, naproxen, or ketoprofen unless instructed by your doctor. These medicines may hide a fever. Do not become pregnant while taking this medicine. Women should inform their doctor if they wish to become pregnant or think they might be pregnant. There is a potential for serious side effects to an unborn child. Talk to your health care professional or pharmacist for more information. Do not breast-feed an infant while taking  this medicine. Men should inform their doctor if they wish to father a child. This medicine may lower sperm counts. Do not treat diarrhea with over the counter products. Contact your doctor if you have diarrhea that lasts more than 2 days or if it is severe and watery. This medicine can make you more sensitive to the sun. Keep out of the sun. If you cannot avoid being in the sun, wear protective clothing and use sunscreen. Do not use sun lamps or tanning beds/booths. What side effects may I notice from receiving this medication? Side effects that you should report to your doctor or health care professional as soon as possible: allergic reactions like skin rash, itching or hives, swelling of the face, lips, or tongue low blood counts - this medicine  may decrease the number of white blood cells, red blood cells and platelets. You may be at increased risk for infections and bleeding. signs of infection - fever or chills, cough, sore throat, pain or difficulty passing urine signs of decreased platelets or bleeding - bruising, pinpoint red spots on the skin, black, tarry stools, blood in the urine signs of decreased red blood cells - unusually weak or tired, fainting spells, lightheadedness breathing problems changes in vision chest pain mouth sores nausea and vomiting pain, swelling, redness at site where injected pain, tingling, numbness in the hands or feet redness, swelling, or sores on hands or feet stomach pain unusual bleeding Side effects that usually do not require medical attention (report to your doctor or health care professional if they continue or are bothersome): changes in finger or toe nails diarrhea dry or itchy skin hair loss headache loss of appetite sensitivity of eyes to the light stomach upset unusually teary eyes This list may not describe all possible side effects. Call your doctor for medical advice about side effects. You may report side effects to FDA at  1-800-FDA-1088. Where should I keep my medication? This drug is given in a hospital or clinic and will not be stored at home. NOTE: This sheet is a summary. It may not cover all possible information. If you have questions about this medicine, talk to your doctor, pharmacist, or health care provider.  2022 Elsevier/Gold Standard (2019-10-04 15:00:03)  The chemotherapy medication bag should finish at 46 hours, 96 hours, or 7 days. For example, if your pump is scheduled for 46 hours and it was put on at 4:00 p.m., it should finish at 2:00 p.m. the day it is scheduled to come off regardless of your appointment time.     Estimated time to finish at 100 on Wednesday.   If the display on your pump reads "Low Volume" and it is beeping, take the batteries out of the pump and come to the cancer center for it to be taken off.   If the pump alarms go off prior to the pump reading "Low Volume" then call [phone] and someone can assist you.  If the plunger comes out and the chemotherapy medication is leaking out, please use your home chemo spill kit to clean up the spill. Do NOT use paper towels or other household products.  If you have problems or questions regarding your pump, please call either [phone] (24 hours a day) or the cancer center Monday-Friday 8:00 a.m.- 4:30 p.m. at the clinic number and we will assist you. If you are unable to get assistance, then go to the nearest Emergency Department and ask the staff to contact the IV team for assistance.

## 2021-09-03 ENCOUNTER — Other Ambulatory Visit: Payer: Self-pay

## 2021-09-03 MED ORDER — FLUCONAZOLE 100 MG PO TABS
100.0000 mg | ORAL_TABLET | Freq: Every day | ORAL | 1 refills | Status: DC
Start: 2021-09-03 — End: 2021-09-23

## 2021-09-04 ENCOUNTER — Inpatient Hospital Stay: Payer: Medicare HMO

## 2021-09-04 ENCOUNTER — Other Ambulatory Visit: Payer: Self-pay

## 2021-09-04 VITALS — BP 143/47 | HR 72 | Temp 98.2°F | Resp 18

## 2021-09-04 DIAGNOSIS — C20 Malignant neoplasm of rectum: Secondary | ICD-10-CM

## 2021-09-04 DIAGNOSIS — D6481 Anemia due to antineoplastic chemotherapy: Secondary | ICD-10-CM | POA: Diagnosis not present

## 2021-09-04 DIAGNOSIS — Z5111 Encounter for antineoplastic chemotherapy: Secondary | ICD-10-CM | POA: Diagnosis not present

## 2021-09-04 MED ORDER — SODIUM CHLORIDE 0.9% FLUSH
10.0000 mL | INTRAVENOUS | Status: DC | PRN
  Administered 2021-09-04: 10 mL

## 2021-09-04 MED ORDER — PEGFILGRASTIM-CBQV 6 MG/0.6ML ~~LOC~~ SOSY
6.0000 mg | PREFILLED_SYRINGE | Freq: Once | SUBCUTANEOUS | Status: AC
  Administered 2021-09-04: 6 mg via SUBCUTANEOUS

## 2021-09-04 MED ORDER — HEPARIN SOD (PORK) LOCK FLUSH 100 UNIT/ML IV SOLN
500.0000 [IU] | Freq: Once | INTRAVENOUS | Status: AC | PRN
  Administered 2021-09-04: 500 [IU]

## 2021-09-04 NOTE — Patient Instructions (Signed)

## 2021-09-05 ENCOUNTER — Emergency Department (HOSPITAL_COMMUNITY): Payer: Medicare HMO

## 2021-09-05 ENCOUNTER — Emergency Department (HOSPITAL_COMMUNITY)
Admission: EM | Admit: 2021-09-05 | Discharge: 2021-09-05 | Disposition: A | Discharge status: Home / Self Care | Payer: Medicare HMO | Attending: Emergency Medicine | Admitting: Emergency Medicine

## 2021-09-05 DIAGNOSIS — R69 Illness, unspecified: Secondary | ICD-10-CM | POA: Diagnosis not present

## 2021-09-05 DIAGNOSIS — R519 Headache, unspecified: Secondary | ICD-10-CM | POA: Diagnosis not present

## 2021-09-05 DIAGNOSIS — S0993XA Unspecified injury of face, initial encounter: Secondary | ICD-10-CM | POA: Diagnosis not present

## 2021-09-05 DIAGNOSIS — R0902 Hypoxemia: Secondary | ICD-10-CM | POA: Diagnosis not present

## 2021-09-05 DIAGNOSIS — G4489 Other headache syndrome: Secondary | ICD-10-CM | POA: Diagnosis not present

## 2021-09-05 DIAGNOSIS — W19XXXA Unspecified fall, initial encounter: Secondary | ICD-10-CM

## 2021-09-05 DIAGNOSIS — W182XXA Fall in (into) shower or empty bathtub, initial encounter: Secondary | ICD-10-CM | POA: Insufficient documentation

## 2021-09-05 DIAGNOSIS — Z79899 Other long term (current) drug therapy: Secondary | ICD-10-CM | POA: Insufficient documentation

## 2021-09-05 DIAGNOSIS — S0990XA Unspecified injury of head, initial encounter: Secondary | ICD-10-CM | POA: Diagnosis not present

## 2021-09-05 DIAGNOSIS — R42 Dizziness and giddiness: Secondary | ICD-10-CM | POA: Diagnosis not present

## 2021-09-05 DIAGNOSIS — Z743 Need for continuous supervision: Secondary | ICD-10-CM | POA: Diagnosis not present

## 2021-09-05 DIAGNOSIS — R404 Transient alteration of awareness: Secondary | ICD-10-CM | POA: Diagnosis not present

## 2021-09-05 DIAGNOSIS — S0083XA Contusion of other part of head, initial encounter: Secondary | ICD-10-CM | POA: Insufficient documentation

## 2021-09-05 DIAGNOSIS — Y900 Blood alcohol level of less than 20 mg/100 ml: Secondary | ICD-10-CM | POA: Diagnosis not present

## 2021-09-05 DIAGNOSIS — S301XXA Contusion of abdominal wall, initial encounter: Secondary | ICD-10-CM | POA: Diagnosis not present

## 2021-09-05 DIAGNOSIS — Z20822 Contact with and (suspected) exposure to covid-19: Secondary | ICD-10-CM | POA: Diagnosis not present

## 2021-09-05 DIAGNOSIS — Z043 Encounter for examination and observation following other accident: Secondary | ICD-10-CM | POA: Diagnosis not present

## 2021-09-05 DIAGNOSIS — Z7902 Long term (current) use of antithrombotics/antiplatelets: Secondary | ICD-10-CM | POA: Insufficient documentation

## 2021-09-05 DIAGNOSIS — S199XXA Unspecified injury of neck, initial encounter: Secondary | ICD-10-CM | POA: Diagnosis not present

## 2021-09-05 DIAGNOSIS — M1611 Unilateral primary osteoarthritis, right hip: Secondary | ICD-10-CM | POA: Diagnosis not present

## 2021-09-05 LAB — URINALYSIS, ROUTINE W REFLEX MICROSCOPIC
Bilirubin Urine: NEGATIVE
Glucose, UA: NEGATIVE mg/dL
Ketones, ur: NEGATIVE mg/dL
Nitrite: NEGATIVE
Protein, ur: NEGATIVE mg/dL
Specific Gravity, Urine: 1.025 (ref 1.005–1.030)
pH: 5.5 (ref 5.0–8.0)

## 2021-09-05 LAB — COMPREHENSIVE METABOLIC PANEL
ALT: 12 U/L (ref 0–44)
AST: 21 U/L (ref 15–41)
Albumin: 2.5 g/dL — ABNORMAL LOW (ref 3.5–5.0)
Alkaline Phosphatase: 192 U/L — ABNORMAL HIGH (ref 38–126)
Anion gap: 13 (ref 5–15)
BUN: 16 mg/dL (ref 8–23)
CO2: 22 mmol/L (ref 22–32)
Calcium: 8.5 mg/dL — ABNORMAL LOW (ref 8.9–10.3)
Chloride: 104 mmol/L (ref 98–111)
Creatinine, Ser: 1.07 mg/dL — ABNORMAL HIGH (ref 0.44–1.00)
GFR, Estimated: 54 mL/min — ABNORMAL LOW (ref >60)
Glucose, Bld: 213 mg/dL — ABNORMAL HIGH (ref 70–99)
Potassium: 4.9 mmol/L (ref 3.5–5.1)
Sodium: 139 mmol/L (ref 135–145)
Total Bilirubin: 0.6 mg/dL (ref 0.3–1.2)
Total Protein: 5.8 g/dL — ABNORMAL LOW (ref 6.5–8.1)

## 2021-09-05 LAB — I-STAT CHEM 8, ED
BUN: 19 mg/dL (ref 8–23)
Calcium, Ion: 1.02 mmol/L — ABNORMAL LOW (ref 1.15–1.40)
Chloride: 105 mmol/L (ref 98–111)
Creatinine, Ser: 0.8 mg/dL (ref 0.44–1.00)
Glucose, Bld: 216 mg/dL — ABNORMAL HIGH (ref 70–99)
HCT: 27 % — ABNORMAL LOW (ref 36.0–46.0)
Hemoglobin: 9.2 g/dL — ABNORMAL LOW (ref 12.0–15.0)
Potassium: 5.2 mmol/L — ABNORMAL HIGH (ref 3.5–5.1)
Sodium: 139 mmol/L (ref 135–145)
TCO2: 24 mmol/L (ref 22–32)

## 2021-09-05 LAB — CBC WITH DIFFERENTIAL/PLATELET
Abs Immature Granulocytes: 0 10*3/uL (ref 0.00–0.07)
Basophils Absolute: 0 10*3/uL (ref 0.0–0.1)
Basophils Relative: 0 %
Eosinophils Absolute: 0 10*3/uL (ref 0.0–0.5)
Eosinophils Relative: 0 %
HCT: 29.7 % — ABNORMAL LOW (ref 36.0–46.0)
Hemoglobin: 9 g/dL — ABNORMAL LOW (ref 12.0–15.0)
Lymphocytes Relative: 1 %
Lymphs Abs: 0.6 10*3/uL — ABNORMAL LOW (ref 0.7–4.0)
MCH: 29 pg (ref 26.0–34.0)
MCHC: 30.3 g/dL (ref 30.0–36.0)
MCV: 95.8 fL (ref 80.0–100.0)
Monocytes Absolute: 0 10*3/uL — ABNORMAL LOW (ref 0.1–1.0)
Monocytes Relative: 0 %
Neutro Abs: 63 10*3/uL — ABNORMAL HIGH (ref 1.7–7.7)
Neutrophils Relative %: 99 %
Platelets: 253 10*3/uL (ref 150–400)
RBC: 3.1 MIL/uL — ABNORMAL LOW (ref 3.87–5.11)
RDW: 23.4 % — ABNORMAL HIGH (ref 11.5–15.5)
WBC: 63.6 10*3/uL (ref 4.0–10.5)
nRBC: 0 % (ref 0.0–0.2)
nRBC: 0 /100 WBC

## 2021-09-05 LAB — CBC
HCT: 28.9 % — ABNORMAL LOW (ref 36.0–46.0)
Hemoglobin: 8.7 g/dL — ABNORMAL LOW (ref 12.0–15.0)
MCH: 28.8 pg (ref 26.0–34.0)
MCHC: 30.1 g/dL (ref 30.0–36.0)
MCV: 95.7 fL (ref 80.0–100.0)
Platelets: 267 10*3/uL (ref 150–400)
RBC: 3.02 MIL/uL — ABNORMAL LOW (ref 3.87–5.11)
RDW: 22.9 % — ABNORMAL HIGH (ref 11.5–15.5)
WBC: 63.9 10*3/uL (ref 4.0–10.5)
nRBC: 0 % (ref 0.0–0.2)

## 2021-09-05 LAB — PROTIME-INR
INR: 1.2 (ref 0.8–1.2)
Prothrombin Time: 15.4 seconds — ABNORMAL HIGH (ref 11.4–15.2)

## 2021-09-05 LAB — RESP PANEL BY RT-PCR (FLU A&B, COVID) ARPGX2
Influenza A by PCR: NEGATIVE
Influenza B by PCR: NEGATIVE
SARS Coronavirus 2 by RT PCR: NEGATIVE

## 2021-09-05 LAB — URINALYSIS, MICROSCOPIC (REFLEX): RBC / HPF: NONE SEEN RBC/hpf (ref 0–5)

## 2021-09-05 LAB — ETHANOL: Alcohol, Ethyl (B): <10 mg/dL (ref <10)

## 2021-09-05 LAB — SAMPLE TO BLOOD BANK

## 2021-09-05 MED ORDER — ONDANSETRON HCL 4 MG/2ML IJ SOLN
INTRAMUSCULAR | Status: AC
  Administered 2021-09-05: 4 mg via INTRAVENOUS
  Filled 2021-09-05: qty 2

## 2021-09-05 MED ORDER — ONDANSETRON HCL 4 MG/2ML IJ SOLN: 4.0000 mg | Freq: Once | INTRAMUSCULAR | Status: AC

## 2021-09-05 MED ORDER — ACETAMINOPHEN 500 MG PO TABS
1000.0000 mg | ORAL_TABLET | Freq: Once | ORAL | Status: AC
  Administered 2021-09-05: 1000 mg via ORAL
  Filled 2021-09-05: qty 2

## 2021-09-05 NOTE — ED Notes (Signed)
Family at bedside. 

## 2021-09-05 NOTE — Discharge Instructions (Addendum)
Call to schedule follow up with your oncologist in 1-2 days to recheck your labs and make sure they are improving. Return to the ED with any new signs of infection, especially fever.

## 2021-09-05 NOTE — Progress Notes (Signed)
Orthopedic Tech Progress Note Patient Details:  [name] 11/17/1875 [id number]  Patient ID: [name], female   DOB: 11/17/1875, 75 y.o.   MRN: [id number] Level 2 not needed.  [name] [name] 09/05/2021, 6:12 PM

## 2021-09-05 NOTE — ED Notes (Signed)
Patient transported to CT 

## 2021-09-05 NOTE — Progress Notes (Addendum)
   09/05/21 1840  Clinical Encounter Type  Visited With Patient  Visit Type Trauma  Referral From Nurse  Consult/Referral To Chaplain   Followed up on patient after she returned from CT. Chaplain checked in with the attending nurse Hassan Rowan. Patient appeared to be sleeping. There are no support person present. Chaplain remains available for follow-up spiritual/emotional support as needed. This note was prepared by Jeanine Luz, M.Div..  For questions please contact by phone [phone].

## 2021-09-05 NOTE — ED Notes (Signed)
Trauma Response Nurse Note-  Reason for Call / Reason for Trauma activation:   - L2 fall on plavix  Initial Focused Assessment (If applicable, or please see trauma documentation):  - GCS 14 (confused on arrival -- not baseline) - Vitals WDL 124/78 manual BP - 20G PIV L wrist - c/o HA posterior - pupils PERRLA - c-collar in place - R chest port   Interventions:  - trauma labs - log rolled pt - CXR - pelvic xray - CT head and neck  Plan of Care as of this note:  - Awaiting scan results - Awaiting family arrival  Event Summary:   - Pt tripped over her feet and fell into empty bathtub at home striking the posterior left side of head.  Pt's family was present at this time.  Pt does have recent hx of rectal and cervical CA. Pt has new onset confusion and nausea.  Pt also takes plavix.  TRN [employer] [phone]

## 2021-09-05 NOTE — ED Provider Notes (Signed)
[facility] Provider Note   CSN: [id number] Arrival date & time: 09/05/21  1821     History No chief complaint on file.   [name] is a 75 y.o. female.  HPI This is a 76 year old female with history of colon cancer status post colostomy on chemotherapy and also on Plavix who presents after a fall.  Reportedly a mechanical fall where patient tripped over her feet and fell onto an empty bathtub.  Patient reports she hit the back of her head on the left side and also possibly the front of her face.  Reports pain only to her head.  Some confusion reported at the scene.    History reviewed. No pertinent past medical history.  There are no problems to display for this patient.   History reviewed. No pertinent surgical history.   OB History   No obstetric history on file.     No family history on file.     Home Medications Prior to Admission medications   Not on File    Allergies    Patient has no known allergies.  Review of Systems   Review of Systems  Constitutional:  Negative for chills and fever.  HENT:  Negative for ear pain and sore throat.   Eyes:  Negative for pain and visual disturbance.  Respiratory:  Negative for cough and shortness of breath.   Cardiovascular:  Negative for chest pain and palpitations.  Gastrointestinal:  Negative for abdominal pain and vomiting.  Genitourinary:  Negative for dysuria and hematuria.  Musculoskeletal:  Negative for arthralgias and back pain.  Skin:  Negative for color change and rash.  Neurological:  Positive for headaches. Negative for seizures and syncope.  All other systems reviewed and are negative.  Physical Exam Updated Vital Signs BP 134/74 (BP Location: Right Arm)   Pulse 84   Temp (!) 97.2 F (36.2 C) (Temporal)   Resp 20   Ht 5' (1.524 m)   Wt 65.8 kg   SpO2 99%   BMI 28.32 kg/m   Physical Exam Vitals and nursing note reviewed.  Constitutional:       General: She is not in acute distress.    Appearance: She is well-developed.  HENT:     Head: Normocephalic.     Comments: Faint bruising to the right cheek without gross deformity.  Mild tenderness to palpation of the right cheek but the midface is stable. Eyes:     Conjunctiva/sclera: Conjunctivae normal.  Cardiovascular:     Rate and Rhythm: Normal rate and regular rhythm.     Heart sounds: No murmur heard. Pulmonary:     Effort: Pulmonary effort is normal. No respiratory distress.     Breath sounds: Normal breath sounds.  Chest:     Chest wall: No tenderness.  Abdominal:     General: Abdomen is flat.     Palpations: Abdomen is soft.     Tenderness: There is no abdominal tenderness.     Comments: Ostomy site is clean dry and intact.  Abdomen is soft nontender and nondistended.  Bruising to the right lateral aspect of the ostomy site with yellowing around the edges.  Musculoskeletal:     Cervical back: Neck supple.  Skin:    General: Skin is warm and dry.  Neurological:     Mental Status: She is alert.    ED Results / Procedures / Treatments   Labs (all labs ordered are listed, but only abnormal results are  displayed) Labs Reviewed  COMPREHENSIVE METABOLIC PANEL - Abnormal; Notable for the following components:      Result Value   Glucose, Bld 213 (*)    Creatinine, Ser 1.07 (*)    Calcium 8.5 (*)    Total Protein 5.8 (*)    Albumin 2.5 (*)    Alkaline Phosphatase 192 (*)    GFR, Estimated 54 (*)    All other components within normal limits  CBC - Abnormal; Notable for the following components:   WBC 63.9 (*)    RBC 3.02 (*)    Hemoglobin 8.7 (*)    HCT 28.9 (*)    RDW 22.9 (*)    All other components within normal limits  URINALYSIS, ROUTINE W REFLEX MICROSCOPIC - Abnormal; Notable for the following components:   Hgb urine dipstick TRACE (*)    Leukocytes,Ua TRACE (*)    All other components within normal limits  PROTIME-INR - Abnormal; Notable for the  following components:   Prothrombin Time 15.4 (*)    All other components within normal limits  CBC WITH DIFFERENTIAL/PLATELET - Abnormal; Notable for the following components:   WBC 63.6 (*)    RBC 3.10 (*)    Hemoglobin 9.0 (*)    HCT 29.7 (*)    RDW 23.4 (*)    Neutro Abs 63.0 (*)    Lymphs Abs 0.6 (*)    Monocytes Absolute 0.0 (*)    All other components within normal limits  URINALYSIS, MICROSCOPIC (REFLEX) - Abnormal; Notable for the following components:   Bacteria, UA RARE (*)    All other components within normal limits  I-STAT CHEM 8, ED - Abnormal; Notable for the following components:   Potassium 5.2 (*)    Glucose, Bld 216 (*)    Calcium, Ion 1.02 (*)    Hemoglobin 9.2 (*)    HCT 27.0 (*)    All other components within normal limits  RESP PANEL BY RT-PCR (FLU A&B, COVID) ARPGX2  ETHANOL  SAMPLE TO BLOOD BANK    EKG None  Radiology CT HEAD WO CONTRAST  Result Date: 09/05/2021 CLINICAL DATA:  Trauma EXAM: CT HEAD WITHOUT CONTRAST CT MAXILLOFACIAL WITHOUT CONTRAST CT CERVICAL SPINE WITHOUT CONTRAST TECHNIQUE: Multidetector CT imaging of the head, cervical spine, and maxillofacial structures were performed using the standard protocol without intravenous contrast. Multiplanar CT image reconstructions of the cervical spine and maxillofacial structures were also generated. COMPARISON:  CT head dated 08/25/2021 FINDINGS: CT HEAD FINDINGS Brain: No evidence of acute infarction, hemorrhage, hydrocephalus, extra-axial collection or mass lesion/mass effect. Subcortical white matter and periventricular small vessel ischemic changes. Vascular: Intracranial atherosclerosis. Skull: Normal. Negative for fracture or focal lesion. Other: None. CT MAXILLOFACIAL FINDINGS Osseous: No evidence of maxillofacial fracture. Mandible is intact. Bilateral mandibular condyles are well-seated in the TMJs. Orbits: Bilateral orbits, including the globes and retroconal soft tissues, are within normal  limits. Sinuses: Partial opacification of the bilateral maxillary sinuses and right sphenoid sinus. Mastoid air cells are clear. Soft tissues: Negative. CT CERVICAL SPINE FINDINGS Alignment: Normal cervical lordosis. Skull base and vertebrae: No acute fracture. No primary bone lesion or focal pathologic process. Soft tissues and spinal canal: No prevertebral fluid or swelling. No visible canal hematoma. Disc levels: Mild degenerative changes of the mid cervical spine. Spinal canal is patent. Upper chest: Visualized lung apices are clear. Other: Visualized thyroid is unremarkable. IMPRESSION: No evidence of acute intracranial abnormality. Small vessel ischemic changes. No evidence of maxillofacial fracture. No evidence of traumatic injury  to the cervical spine. Mild degenerative changes. Electronically Signed   By: Julian Hy M.D.   On: 09/05/2021 19:22   CT CERVICAL SPINE WO CONTRAST  Result Date: 09/05/2021 CLINICAL DATA:  Trauma EXAM: CT HEAD WITHOUT CONTRAST CT MAXILLOFACIAL WITHOUT CONTRAST CT CERVICAL SPINE WITHOUT CONTRAST TECHNIQUE: Multidetector CT imaging of the head, cervical spine, and maxillofacial structures were performed using the standard protocol without intravenous contrast. Multiplanar CT image reconstructions of the cervical spine and maxillofacial structures were also generated. COMPARISON:  CT head dated 08/25/2021 FINDINGS: CT HEAD FINDINGS Brain: No evidence of acute infarction, hemorrhage, hydrocephalus, extra-axial collection or mass lesion/mass effect. Subcortical white matter and periventricular small vessel ischemic changes. Vascular: Intracranial atherosclerosis. Skull: Normal. Negative for fracture or focal lesion. Other: None. CT MAXILLOFACIAL FINDINGS Osseous: No evidence of maxillofacial fracture. Mandible is intact. Bilateral mandibular condyles are well-seated in the TMJs. Orbits: Bilateral orbits, including the globes and retroconal soft tissues, are within normal  limits. Sinuses: Partial opacification of the bilateral maxillary sinuses and right sphenoid sinus. Mastoid air cells are clear. Soft tissues: Negative. CT CERVICAL SPINE FINDINGS Alignment: Normal cervical lordosis. Skull base and vertebrae: No acute fracture. No primary bone lesion or focal pathologic process. Soft tissues and spinal canal: No prevertebral fluid or swelling. No visible canal hematoma. Disc levels: Mild degenerative changes of the mid cervical spine. Spinal canal is patent. Upper chest: Visualized lung apices are clear. Other: Visualized thyroid is unremarkable. IMPRESSION: No evidence of acute intracranial abnormality. Small vessel ischemic changes. No evidence of maxillofacial fracture. No evidence of traumatic injury to the cervical spine. Mild degenerative changes. Electronically Signed   By: Julian Hy M.D.   On: 09/05/2021 19:22   DG Pelvis Portable  Result Date: 09/05/2021 CLINICAL DATA:  [place], anticoagulated EXAM: PORTABLE PELVIS 1-2 VIEWS COMPARISON:  01/18/2021 FINDINGS: Two frontal views of the pelvis are obtained. Portions of the left iliac crest and proximal left femur are excluded by collimation. A left hip arthroplasty is partially visualized, with the distal aspect of the femoral component of the prosthesis excluded by collimation. No change in the appearance of the prosthesis since prior study. No acute displaced fracture. Mild right hip osteoarthritis. IMPRESSION: 1. No acute pelvic fracture. 2. Stable left hip arthroplasty. Electronically Signed   By: Randa Ngo M.D.   On: 09/05/2021 19:12   DG Chest Port 1 View  Result Date: 09/05/2021 CLINICAL DATA:  [place], anticoagulated EXAM: PORTABLE CHEST 1 VIEW COMPARISON:  08/25/2021 FINDINGS: 2 frontal views of the chest demonstrates stable right chest wall port. Cardiac silhouette is stable. No acute airspace disease, effusion, or pneumothorax. No acute bony abnormality. IMPRESSION: 1. No acute intrathoracic process.  Electronically Signed   By: Randa Ngo M.D.   On: 09/05/2021 19:10   CT MAXILLOFACIAL WO CONTRAST  Result Date: 09/05/2021 CLINICAL DATA:  Trauma EXAM: CT HEAD WITHOUT CONTRAST CT MAXILLOFACIAL WITHOUT CONTRAST CT CERVICAL SPINE WITHOUT CONTRAST TECHNIQUE: Multidetector CT imaging of the head, cervical spine, and maxillofacial structures were performed using the standard protocol without intravenous contrast. Multiplanar CT image reconstructions of the cervical spine and maxillofacial structures were also generated. COMPARISON:  CT head dated 08/25/2021 FINDINGS: CT HEAD FINDINGS Brain: No evidence of acute infarction, hemorrhage, hydrocephalus, extra-axial collection or mass lesion/mass effect. Subcortical white matter and periventricular small vessel ischemic changes. Vascular: Intracranial atherosclerosis. Skull: Normal. Negative for fracture or focal lesion. Other: None. CT MAXILLOFACIAL FINDINGS Osseous: No evidence of maxillofacial fracture. Mandible is intact. Bilateral mandibular condyles are well-seated  in the TMJs. Orbits: Bilateral orbits, including the globes and retroconal soft tissues, are within normal limits. Sinuses: Partial opacification of the bilateral maxillary sinuses and right sphenoid sinus. Mastoid air cells are clear. Soft tissues: Negative. CT CERVICAL SPINE FINDINGS Alignment: Normal cervical lordosis. Skull base and vertebrae: No acute fracture. No primary bone lesion or focal pathologic process. Soft tissues and spinal canal: No prevertebral fluid or swelling. No visible canal hematoma. Disc levels: Mild degenerative changes of the mid cervical spine. Spinal canal is patent. Upper chest: Visualized lung apices are clear. Other: Visualized thyroid is unremarkable. IMPRESSION: No evidence of acute intracranial abnormality. Small vessel ischemic changes. No evidence of maxillofacial fracture. No evidence of traumatic injury to the cervical spine. Mild degenerative changes.  Electronically Signed   By: Julian Hy M.D.   On: 09/05/2021 19:22    Procedures Procedures   Medications Ordered in ED Medications  ondansetron (ZOFRAN) injection 4 mg (4 mg Intravenous Given 09/05/21 1909)  acetaminophen (TYLENOL) tablet 1,000 mg (1,000 mg Oral Given 09/05/21 2037)    ED Course  I have reviewed the triage vital signs and the nursing notes.  Pertinent labs & imaging results that were available during my care of the patient were reviewed by me and considered in my medical decision making (see chart for details).  Clinical Course as of 09/06/21 0011  Thu Sep 05, 2021  2138 Hemoglobin(!): 8.7 [AZ]    Clinical Course User Index [AZ] Coralee Pesa, MD   MDM Rules/Calculators/A&P                          Upon arrival of the patient, EMS provided pertinent history and exam findings. The patient was transferred over to the trauma bed. ABCs intact as exam above. GCS 14. Once IV access was placed and/or confirmed, the secondary exam was performed. Pertinent physical exam findings include TTP of posterior scalp with mild bruising to the R cheek. Old appearing bruising to R abdomen without TTP.  Family confirms this bruising has been present previously, prior to fall.  Low suspicion for acute intra-abdominal injury.  Portable XRs were performed at the bedside. The patient was then prepared and sent to the CT for trauma scans.  CT C spine, Face, and head were performed and there were no acute traumatic injuries found. CXR and pelvis xray also negative for traumatic injuries. Labs obtained and demonstrated hgb stable and unremarkable CMP and urine. WBC elevated significantly at 64. Spoke with oncology who confirms patients Pegfilgrastim infusion from yesterday likely contributes to this value. They confirm is no signs of sepsis clinically should not admit just for WBC elevation. UA negative for UTI.  Chest x-ray negative for source of infection.  Patient is afebrile without  tachycardia or hypotension.  She is at her neurologic baseline.  Spoke with family about this who agrees and advocates for discharge home.  Given strict return precautions including any infectious symptoms and fever.   The cervical collar was removed.  The patient had no tenderness to palpation of the cervical spine.  The patient had no pain with flexion, extension, or lateral rotation (left/right).    Typical course of this pain is explained. Strict return precautions given.  Encouraged her to follow-up with her PCP on an outpatient basis. Will call her oncologist tomorrow to schedule follow up for lab recheck. Questions were answered. Patient discharged in stable condition.    Final Clinical Impression(s) / ED Diagnoses Final  diagnoses:  Fall    Rx / DC Orders ED Discharge Orders     None        Coralee Pesa, MD 09/06/21 0011    Lucrezia Starch, MD 09/06/21 [phone]

## 2021-09-06 ENCOUNTER — Encounter (HOSPITAL_COMMUNITY): Payer: Self-pay

## 2021-09-06 ENCOUNTER — Encounter (HOSPITAL_COMMUNITY): Payer: Self-pay | Admitting: Emergency Medicine

## 2021-09-06 ENCOUNTER — Encounter: Payer: Self-pay | Admitting: Oncology

## 2021-09-07 DIAGNOSIS — C20 Malignant neoplasm of rectum: Secondary | ICD-10-CM | POA: Diagnosis not present

## 2021-09-09 ENCOUNTER — Inpatient Hospital Stay: Payer: Medicare HMO

## 2021-09-09 ENCOUNTER — Inpatient Hospital Stay: Payer: Medicare HMO | Admitting: Oncology

## 2021-09-11 ENCOUNTER — Inpatient Hospital Stay: Payer: Medicare HMO

## 2021-09-15 ENCOUNTER — Other Ambulatory Visit: Payer: Self-pay | Admitting: Oncology

## 2021-09-16 ENCOUNTER — Ambulatory Visit: Payer: Medicare HMO | Admitting: Podiatry

## 2021-09-17 ENCOUNTER — Inpatient Hospital Stay: Payer: Medicare HMO | Admitting: Nurse Practitioner

## 2021-09-17 ENCOUNTER — Inpatient Hospital Stay: Payer: Medicare HMO

## 2021-09-17 ENCOUNTER — Inpatient Hospital Stay: Payer: Medicare HMO | Attending: Nurse Practitioner

## 2021-09-17 ENCOUNTER — Encounter: Payer: Self-pay | Admitting: Nurse Practitioner

## 2021-09-17 ENCOUNTER — Other Ambulatory Visit: Payer: Self-pay

## 2021-09-17 VITALS — BP 128/55 | HR 90 | Temp 98.0°F | Resp 18 | Ht 60.0 in | Wt 133.8 lb

## 2021-09-17 DIAGNOSIS — D6481 Anemia due to antineoplastic chemotherapy: Secondary | ICD-10-CM | POA: Insufficient documentation

## 2021-09-17 DIAGNOSIS — C20 Malignant neoplasm of rectum: Secondary | ICD-10-CM

## 2021-09-17 DIAGNOSIS — Z5111 Encounter for antineoplastic chemotherapy: Secondary | ICD-10-CM | POA: Insufficient documentation

## 2021-09-17 DIAGNOSIS — C539 Malignant neoplasm of cervix uteri, unspecified: Secondary | ICD-10-CM | POA: Insufficient documentation

## 2021-09-17 DIAGNOSIS — N136 Pyonephrosis: Secondary | ICD-10-CM | POA: Diagnosis not present

## 2021-09-17 LAB — CBC WITH DIFFERENTIAL (CANCER CENTER ONLY)
Abs Immature Granulocytes: 0.6 10*3/uL — ABNORMAL HIGH (ref 0.00–0.07)
Basophils Absolute: 0.1 10*3/uL (ref 0.0–0.1)
Basophils Relative: 1 %
Eosinophils Absolute: 0.1 10*3/uL (ref 0.0–0.5)
Eosinophils Relative: 1 %
HCT: 25.2 % — ABNORMAL LOW (ref 36.0–46.0)
Hemoglobin: 7.6 g/dL — ABNORMAL LOW (ref 12.0–15.0)
Immature Granulocytes: 6 %
Lymphocytes Relative: 13 %
Lymphs Abs: 1.3 10*3/uL (ref 0.7–4.0)
MCH: 29.8 pg (ref 26.0–34.0)
MCHC: 30.2 g/dL (ref 30.0–36.0)
MCV: 98.8 fL (ref 80.0–100.0)
Monocytes Absolute: 0.9 10*3/uL (ref 0.1–1.0)
Monocytes Relative: 9 %
Neutro Abs: 7.2 10*3/uL (ref 1.7–7.7)
Neutrophils Relative %: 70 %
Platelet Count: 129 10*3/uL — ABNORMAL LOW (ref 150–400)
RBC: 2.55 MIL/uL — ABNORMAL LOW (ref 3.87–5.11)
RDW: 26.7 % — ABNORMAL HIGH (ref 11.5–15.5)
Smear Review: NORMAL
WBC Count: 10.2 10*3/uL (ref 4.0–10.5)
nRBC: 0.8 % — ABNORMAL HIGH (ref 0.0–0.2)

## 2021-09-17 LAB — CMP (CANCER CENTER ONLY)
ALT: 7 U/L (ref 0–44)
AST: 13 U/L — ABNORMAL LOW (ref 15–41)
Albumin: 3.2 g/dL — ABNORMAL LOW (ref 3.5–5.0)
Alkaline Phosphatase: 161 U/L — ABNORMAL HIGH (ref 38–126)
Anion gap: 15 (ref 5–15)
BUN: 12 mg/dL (ref 8–23)
CO2: 23 mmol/L (ref 22–32)
Calcium: 8.1 mg/dL — ABNORMAL LOW (ref 8.9–10.3)
Chloride: 102 mmol/L (ref 98–111)
Creatinine: 1.02 mg/dL — ABNORMAL HIGH (ref 0.44–1.00)
GFR, Estimated: 57 mL/min — ABNORMAL LOW (ref >60)
Glucose, Bld: 222 mg/dL — ABNORMAL HIGH (ref 70–99)
Potassium: 3.6 mmol/L (ref 3.5–5.1)
Sodium: 140 mmol/L (ref 135–145)
Total Bilirubin: 0.7 mg/dL (ref 0.3–1.2)
Total Protein: 6.1 g/dL — ABNORMAL LOW (ref 6.5–8.1)

## 2021-09-17 MED ORDER — SODIUM CHLORIDE 0.9 % IV SOLN
2000.0000 mg/m2 | INTRAVENOUS | Status: DC
  Administered 2021-09-17: 3350 mg via INTRAVENOUS
  Filled 2021-09-17: qty 67

## 2021-09-17 MED ORDER — PALONOSETRON HCL INJECTION 0.25 MG/5ML
0.2500 mg | Freq: Once | INTRAVENOUS | Status: AC
  Administered 2021-09-17: 0.25 mg via INTRAVENOUS
  Filled 2021-09-17: qty 5

## 2021-09-17 MED ORDER — DEXTROSE 5 % IV SOLN: Freq: Once | INTRAVENOUS | Status: AC

## 2021-09-17 MED ORDER — OXALIPLATIN CHEMO INJECTION 100 MG/20ML
55.0000 mg/m2 | Freq: Once | INTRAVENOUS | Status: AC
  Administered 2021-09-17: 90 mg via INTRAVENOUS
  Filled 2021-09-17: qty 18

## 2021-09-17 MED ORDER — SODIUM CHLORIDE 0.9 % IV SOLN
10.0000 mg | Freq: Once | INTRAVENOUS | Status: AC
  Administered 2021-09-17: 10 mg via INTRAVENOUS
  Filled 2021-09-17: qty 1

## 2021-09-17 MED ORDER — LEUCOVORIN CALCIUM INJECTION 350 MG
400.0000 mg/m2 | Freq: Once | INTRAVENOUS | Status: AC
  Administered 2021-09-17: 672 mg via INTRAVENOUS
  Filled 2021-09-17: qty 33.6

## 2021-09-17 MED ORDER — FLUOROURACIL CHEMO INJECTION 2.5 GM/50ML
400.0000 mg/m2 | Freq: Once | INTRAVENOUS | Status: AC
  Administered 2021-09-17: 650 mg via INTRAVENOUS
  Filled 2021-09-17: qty 13

## 2021-09-17 NOTE — Progress Notes (Signed)
Patient presents for treatment. RN assessment completed along with the following:  Labs/vitals reviewed - Yes, and Per Ned Card, NP note from 09/17/21, ok to proceed with treatment with Hgb 7.6    Weight within 10% of previous measurement - Yes Oncology Treatment Attestation completed for current therapy- Yes, on date 07/01/21 Informed consent completed and reflects current therapy/intent - Yes, on date 07/08/21             Provider progress note reviewed - Yes, today's provider note was reviewed. Treatment/Antibody/Supportive plan reviewed - Yes, and there are no adjustments needed for today's treatment. S&H and other orders reviewed - Yes, and there are no additional orders identified. Previous treatment date reviewed - Yes, and the appropriate amount of time has elapsed between treatments.   Patient to proceed with treatment.

## 2021-09-17 NOTE — Patient Instructions (Addendum)
[facility]  Discharge Instructions: Thank you for choosing [facility] to provide your oncology and hematology care.   If you have a lab appointment with the [facility], please go directly to the [facility] and check in at the registration area.   Wear comfortable clothing and clothing appropriate for easy access to any Portacath or PICC line.   We strive to give you quality time with your provider. You may need to reschedule your appointment if you arrive late (15 or more minutes).  Arriving late affects you and other patients whose appointments are after yours.  Also, if you miss three or more appointments without notifying the office, you may be dismissed from the clinic at the provider's discretion.      For prescription refill requests, have your pharmacy contact our office and allow 72 hours for refills to be completed.    Today you received the following chemotherapy and/or immunotherapy agents oxaliplatin, leucovorin, fluorouracil      To help prevent nausea and vomiting after your treatment, we encourage you to take your nausea medication as directed.  BELOW ARE SYMPTOMS THAT SHOULD BE REPORTED IMMEDIATELY: *FEVER GREATER THAN 100.4 F (38 C) OR HIGHER *CHILLS OR SWEATING *NAUSEA AND VOMITING THAT IS NOT CONTROLLED WITH YOUR NAUSEA MEDICATION *UNUSUAL SHORTNESS OF BREATH *UNUSUAL BRUISING OR BLEEDING *URINARY PROBLEMS (pain or burning when urinating, or frequent urination) *BOWEL PROBLEMS (unusual diarrhea, constipation, pain near the anus) TENDERNESS IN MOUTH AND THROAT WITH OR WITHOUT PRESENCE OF ULCERS (sore throat, sores in mouth, or a toothache) UNUSUAL RASH, SWELLING OR PAIN  UNUSUAL VAGINAL DISCHARGE OR ITCHING   Items with * indicate a potential emergency and should be followed up as soon as possible or go to the Emergency Department if any problems should occur.  Please show the CHEMOTHERAPY ALERT CARD or IMMUNOTHERAPY  ALERT CARD at check-in to the Emergency Department and triage nurse.  Should you have questions after your visit or need to cancel or reschedule your appointment, please contact [facility]  Dept: [phone]  and follow the prompts.  Office hours are 8:00 a.m. to 4:30 p.m. Monday - Friday. Please note that voicemails left after 4:00 p.m. may not be returned until the following business day.  We are closed weekends and major holidays. You have access to a nurse at all times for urgent questions. Please call the main number to the clinic Dept: [phone] and follow the prompts.   For any non-urgent questions, you may also contact your provider using MyChart. We now offer e-Visits for anyone 75 and older to request care online for non-urgent symptoms. For details visit [email or web address].   Also download the MyChart app! Go to the app store, search "MyChart", open the app, select [facility], and log in with your MyChart username and password.  Due to Covid, a mask is required upon entering the hospital/clinic. If you do not have a mask, one will be given to you upon arrival. For doctor visits, patients may have 1 support person aged 34 or older with them. For treatment visits, patients cannot have anyone with them due to current Covid guidelines and our immunocompromised population.   Oxaliplatin Injection What is this medication? OXALIPLATIN (ox AL i PLA tin) is a chemotherapy drug. It targets fast dividing cells, like cancer cells, and causes these cells to die. This medicine is used to treat cancers of the colon and rectum, and many other cancers. This  medicine may be used for other purposes; ask your health care provider or pharmacist if you have questions. COMMON BRAND NAME(S): Eloxatin What should I tell my care team before I take this medication? They need to know if you have any of these conditions: heart disease history of irregular heartbeat liver  disease low blood counts, like white cells, platelets, or red blood cells lung or breathing disease, like asthma take medicines that treat or prevent blood clots tingling of the fingers or toes, or other nerve disorder an unusual or allergic reaction to oxaliplatin, other chemotherapy, other medicines, foods, dyes, or preservatives pregnant or trying to get pregnant breast-feeding How should I use this medication? This drug is given as an infusion into a vein. It is administered in a hospital or clinic by a specially trained health care professional. Talk to your pediatrician regarding the use of this medicine in children. Special care may be needed. Overdosage: If you think you have taken too much of this medicine contact a poison control center or emergency room at once. NOTE: This medicine is only for you. Do not share this medicine with others. What if I miss a dose? It is important not to miss a dose. Call your doctor or health care professional if you are unable to keep an appointment. What may interact with this medication? Do not take this medicine with any of the following medications: cisapride dronedarone pimozide thioridazine This medicine may also interact with the following medications: aspirin and aspirin-like medicines certain medicines that treat or prevent blood clots like warfarin, apixaban, dabigatran, and rivaroxaban cisplatin cyclosporine diuretics medicines for infection like acyclovir, adefovir, amphotericin B, bacitracin, cidofovir, foscarnet, ganciclovir, gentamicin, pentamidine, vancomycin NSAIDs, medicines for pain and inflammation, like ibuprofen or naproxen other medicines that prolong the QT interval (an abnormal heart rhythm) pamidronate zoledronic acid This list may not describe all possible interactions. Give your health care provider a list of all the medicines, herbs, non-prescription drugs, or dietary supplements you use. Also tell them if you  smoke, drink alcohol, or use illegal drugs. Some items may interact with your medicine. What should I watch for while using this medication? Your condition will be monitored carefully while you are receiving this medicine. You may need blood work done while you are taking this medicine. This medicine may make you feel generally unwell. This is not uncommon as chemotherapy can affect healthy cells as well as cancer cells. Report any side effects. Continue your course of treatment even though you feel ill unless your healthcare professional tells you to stop. This medicine can make you more sensitive to cold. Do not drink cold drinks or use ice. Cover exposed skin before coming in contact with cold temperatures or cold objects. When out in cold weather wear warm clothing and cover your mouth and nose to warm the air that goes into your lungs. Tell your doctor if you get sensitive to the cold. Do not become pregnant while taking this medicine or for 9 months after stopping it. Women should inform their health care professional if they wish to become pregnant or think they might be pregnant. Men should not father a child while taking this medicine and for 6 months after stopping it. There is potential for serious side effects to an unborn child. Talk to your health care professional for more information. Do not breast-feed a child while taking this medicine or for 3 months after stopping it. This medicine has caused ovarian failure in some women. This medicine  may make it more difficult to get pregnant. Talk to your health care professional if you are concerned about your fertility. This medicine has caused decreased sperm counts in some men. This may make it more difficult to father a child. Talk to your health care professional if you are concerned about your fertility. This medicine may increase your risk of getting an infection. Call your health care professional for advice if you get a fever, chills, or  sore throat, or other symptoms of a cold or flu. Do not treat yourself. Try to avoid being around people who are sick. Avoid taking medicines that contain aspirin, acetaminophen, ibuprofen, naproxen, or ketoprofen unless instructed by your health care professional. These medicines may hide a fever. Be careful brushing or flossing your teeth or using a toothpick because you may get an infection or bleed more easily. If you have any dental work done, tell your dentist you are receiving this medicine. What side effects may I notice from receiving this medication? Side effects that you should report to your doctor or health care professional as soon as possible: allergic reactions like skin rash, itching or hives, swelling of the face, lips, or tongue breathing problems cough low blood counts - this medicine may decrease the number of white blood cells, red blood cells, and platelets. You may be at increased risk for infections and bleeding nausea, vomiting pain, redness, or irritation at site where injected pain, tingling, numbness in the hands or feet signs and symptoms of bleeding such as bloody or black, tarry stools; red or dark brown urine; spitting up blood or brown material that looks like coffee grounds; red spots on the skin; unusual bruising or bleeding from the eyes, gums, or nose signs and symptoms of a dangerous change in heartbeat or heart rhythm like chest pain; dizziness; fast, irregular heartbeat; palpitations; feeling faint or lightheaded; falls signs and symptoms of infection like fever; chills; cough; sore throat; pain or trouble passing urine signs and symptoms of liver injury like dark yellow or brown urine; general ill feeling or flu-like symptoms; light-colored stools; loss of appetite; nausea; right upper belly pain; unusually weak or tired; yellowing of the eyes or skin signs and symptoms of low red blood cells or anemia such as unusually weak or tired; feeling faint or  lightheaded; falls signs and symptoms of muscle injury like dark urine; trouble passing urine or change in the amount of urine; unusually weak or tired; muscle pain; back pain Side effects that usually do not require medical attention (report to your doctor or health care professional if they continue or are bothersome): changes in taste diarrhea gas hair loss loss of appetite mouth sores This list may not describe all possible side effects. Call your doctor for medical advice about side effects. You may report side effects to FDA at 1-800-FDA-1088. Where should I keep my medication? This drug is given in a hospital or clinic and will not be stored at home. NOTE: This sheet is a summary. It may not cover all possible information. If you have questions about this medicine, talk to your doctor, pharmacist, or health care provider.  2022 Elsevier/Gold Standard (2019-03-23 12:20:35)  Leucovorin injection What is this medication? LEUCOVORIN (loo koe VOR in) is used to prevent or treat the harmful effects of some medicines. This medicine is used to treat anemia caused by a low amount of folic acid in the body. It is also used with 5-fluorouracil (5-FU) to treat colon cancer. This medicine may  be used for other purposes; ask your health care provider or pharmacist if you have questions. What should I tell my care team before I take this medication? They need to know if you have any of these conditions: anemia from low levels of vitamin B-12 in the blood an unusual or allergic reaction to leucovorin, folic acid, other medicines, foods, dyes, or preservatives pregnant or trying to get pregnant breast-feeding How should I use this medication? This medicine is for injection into a muscle or into a vein. It is given by a health care professional in a hospital or clinic setting. Talk to your pediatrician regarding the use of this medicine in children. Special care may be needed. Overdosage: If you  think you have taken too much of this medicine contact a poison control center or emergency room at once. NOTE: This medicine is only for you. Do not share this medicine with others. What if I miss a dose? This does not apply. What may interact with this medication? capecitabine fluorouracil phenobarbital phenytoin primidone trimethoprim-sulfamethoxazole This list may not describe all possible interactions. Give your health care provider a list of all the medicines, herbs, non-prescription drugs, or dietary supplements you use. Also tell them if you smoke, drink alcohol, or use illegal drugs. Some items may interact with your medicine. What should I watch for while using this medication? Your condition will be monitored carefully while you are receiving this medicine. This medicine may increase the side effects of 5-fluorouracil, 5-FU. Tell your doctor or health care professional if you have diarrhea or mouth sores that do not get better or that get worse. What side effects may I notice from receiving this medication? Side effects that you should report to your doctor or health care professional as soon as possible: allergic reactions like skin rash, itching or hives, swelling of the face, lips, or tongue breathing problems fever, infection mouth sores unusual bleeding or bruising unusually weak or tired Side effects that usually do not require medical attention (report to your doctor or health care professional if they continue or are bothersome): constipation or diarrhea loss of appetite nausea, vomiting This list may not describe all possible side effects. Call your doctor for medical advice about side effects. You may report side effects to FDA at 1-800-FDA-1088. Where should I keep my medication? This drug is given in a hospital or clinic and will not be stored at home. NOTE: This sheet is a summary. It may not cover all possible information. If you have questions about this  medicine, talk to your doctor, pharmacist, or health care provider.  2022 Elsevier/Gold Standard (2008-05-09 16:50:29)  Fluorouracil, 5-FU injection What is this medication? FLUOROURACIL, 5-FU (flure oh YOOR a sil) is a chemotherapy drug. It slows the growth of cancer cells. This medicine is used to treat many types of cancer like breast cancer, colon or rectal cancer, pancreatic cancer, and stomach cancer. This medicine may be used for other purposes; ask your health care provider or pharmacist if you have questions. COMMON BRAND NAME(S): Adrucil What should I tell my care team before I take this medication? They need to know if you have any of these conditions: blood disorders dihydropyrimidine dehydrogenase (DPD) deficiency infection (especially a virus infection such as chickenpox, cold sores, or herpes) kidney disease liver disease malnourished, poor nutrition recent or ongoing radiation therapy an unusual or allergic reaction to fluorouracil, other chemotherapy, other medicines, foods, dyes, or preservatives pregnant or trying to get pregnant breast-feeding How should I  use this medication? This drug is given as an infusion or injection into a vein. It is administered in a hospital or clinic by a specially trained health care professional. Talk to your pediatrician regarding the use of this medicine in children. Special care may be needed. Overdosage: If you think you have taken too much of this medicine contact a poison control center or emergency room at once. NOTE: This medicine is only for you. Do not share this medicine with others. What if I miss a dose? It is important not to miss your dose. Call your doctor or health care professional if you are unable to keep an appointment. What may interact with this medication? Do not take this medicine with any of the following medications: live virus vaccines This medicine may also interact with the following medications: medicines  that treat or prevent blood clots like warfarin, enoxaparin, and dalteparin This list may not describe all possible interactions. Give your health care provider a list of all the medicines, herbs, non-prescription drugs, or dietary supplements you use. Also tell them if you smoke, drink alcohol, or use illegal drugs. Some items may interact with your medicine. What should I watch for while using this medication? Visit your doctor for checks on your progress. This drug may make you feel generally unwell. This is not uncommon, as chemotherapy can affect healthy cells as well as cancer cells. Report any side effects. Continue your course of treatment even though you feel ill unless your doctor tells you to stop. In some cases, you may be given additional medicines to help with side effects. Follow all directions for their use. Call your doctor or health care professional for advice if you get a fever, chills or sore throat, or other symptoms of a cold or flu. Do not treat yourself. This drug decreases your body's ability to fight infections. Try to avoid being around people who are sick. This medicine may increase your risk to bruise or bleed. Call your doctor or health care professional if you notice any unusual bleeding. Be careful brushing and flossing your teeth or using a toothpick because you may get an infection or bleed more easily. If you have any dental work done, tell your dentist you are receiving this medicine. Avoid taking products that contain aspirin, acetaminophen, ibuprofen, naproxen, or ketoprofen unless instructed by your doctor. These medicines may hide a fever. Do not become pregnant while taking this medicine. Women should inform their doctor if they wish to become pregnant or think they might be pregnant. There is a potential for serious side effects to an unborn child. Talk to your health care professional or pharmacist for more information. Do not breast-feed an infant while taking  this medicine. Men should inform their doctor if they wish to father a child. This medicine may lower sperm counts. Do not treat diarrhea with over the counter products. Contact your doctor if you have diarrhea that lasts more than 2 days or if it is severe and watery. This medicine can make you more sensitive to the sun. Keep out of the sun. If you cannot avoid being in the sun, wear protective clothing and use sunscreen. Do not use sun lamps or tanning beds/booths. What side effects may I notice from receiving this medication? Side effects that you should report to your doctor or health care professional as soon as possible: allergic reactions like skin rash, itching or hives, swelling of the face, lips, or tongue low blood counts - this medicine  may decrease the number of white blood cells, red blood cells and platelets. You may be at increased risk for infections and bleeding. signs of infection - fever or chills, cough, sore throat, pain or difficulty passing urine signs of decreased platelets or bleeding - bruising, pinpoint red spots on the skin, black, tarry stools, blood in the urine signs of decreased red blood cells - unusually weak or tired, fainting spells, lightheadedness breathing problems changes in vision chest pain mouth sores nausea and vomiting pain, swelling, redness at site where injected pain, tingling, numbness in the hands or feet redness, swelling, or sores on hands or feet stomach pain unusual bleeding Side effects that usually do not require medical attention (report to your doctor or health care professional if they continue or are bothersome): changes in finger or toe nails diarrhea dry or itchy skin hair loss headache loss of appetite sensitivity of eyes to the light stomach upset unusually teary eyes This list may not describe all possible side effects. Call your doctor for medical advice about side effects. You may report side effects to FDA at  1-800-FDA-1088. Where should I keep my medication? This drug is given in a hospital or clinic and will not be stored at home. NOTE: This sheet is a summary. It may not cover all possible information. If you have questions about this medicine, talk to your doctor, pharmacist, or health care provider.  2022 Elsevier/Gold Standard (2019-10-04 15:00:03)  The chemotherapy medication bag should finish at 46 hours, 96 hours, or 7 days. For example, if your pump is scheduled for 46 hours and it was put on at 4:00 p.m., it should finish at 2:00 p.m. the day it is scheduled to come off regardless of your appointment time.     Estimated time to finish at 12:30pm on Thursday.   If the display on your pump reads "Low Volume" and it is beeping, take the batteries out of the pump and come to the cancer center for it to be taken off.   If the pump alarms go off prior to the pump reading "Low Volume" then call [phone] and someone can assist you.  If the plunger comes out and the chemotherapy medication is leaking out, please use your home chemo spill kit to clean up the spill. Do NOT use paper towels or other household products.  If you have problems or questions regarding your pump, please call either [phone] (24 hours a day) or the cancer center Monday-Friday 8:00 a.m.- 4:30 p.m. at the clinic number and we will assist you. If you are unable to get assistance, then go to the nearest Emergency Department and ask the staff to contact the IV team for assistance.

## 2021-09-17 NOTE — Patient Instructions (Signed)
Implanted [facility] Guide An implanted port is a device that is placed under the skin. It is usually placed in the chest. The device can be used to give IV medicine, to take blood, or for dialysis. You may have an implanted port if: You need IV medicine that would be irritating to the small veins in your hands or arms. You need IV medicines, such as antibiotics, for a long period of time. You need IV nutrition for a long period of time. You need dialysis. When you have a port, your health care provider can choose to use the port instead of veins in your arms for these procedures. You may have fewer limitations when using a port than you would if you used other types of long-term IVs, and you will likely be able to return to normal activities after your incision heals. An implanted port has two main parts: Reservoir. The reservoir is the part where a needle is inserted to give medicines or draw blood. The reservoir is round. After it is placed, it appears as a small, raised area under your skin. Catheter. The catheter is a thin, flexible tube that connects the reservoir to a vein. Medicine that is inserted into the reservoir goes into the catheter and then into the vein. How is my port accessed? To access your port: A numbing cream may be placed on the skin over the port site. Your health care provider will put on a mask and sterile gloves. The skin over your port will be cleaned carefully with a germ-killing soap and allowed to dry. Your health care provider will gently pinch the port and insert a needle into it. Your health care provider will check for a blood return to make sure the port is in the vein and is not clogged. If your port needs to remain accessed to get medicine continuously (constant infusion), your health care provider will place a clear bandage (dressing) over the needle site. The dressing and needle will need to be changed every week, or as told by your health care provider. What  is flushing? Flushing helps keep the port from getting clogged. Follow instructions from your health care provider about how and when to flush the port. Ports are usually flushed with saline solution or a medicine called heparin. The need for flushing will depend on how the port is used: If the port is only used from time to time to give medicines or draw blood, the port may need to be flushed: Before and after medicines have been given. Before and after blood has been drawn. As part of routine maintenance. Flushing may be recommended every 4-6 weeks. If a constant infusion is running, the port may not need to be flushed. Throw away any syringes in a disposal container that is meant for sharp items (sharps container). You can buy a sharps container from a pharmacy, or you can make one by using an empty hard plastic bottle with a cover. How long will my port stay implanted? The port can stay in for as long as your health care provider thinks it is needed. When it is time for the port to come out, a surgery will be done to remove it. The surgery will be similar to the procedure that was done to put the port in. Follow these instructions at home:  Flush your port as told by your health care provider. If you need an infusion over several days, follow instructions from your health care provider about how  to take care of your port site. Make sure you: Wash your hands with soap and water before you change your dressing. If soap and water are not available, use alcohol-based hand sanitizer. Change your dressing as told by your health care provider. Place any used dressings or infusion bags into a plastic bag. Throw that bag in the trash. Keep the dressing that covers the needle clean and dry. Do not get it wet. Do not use scissors or sharp objects near the tube. Keep the tube clamped, unless it is being used. Check your port site every day for signs of infection. Check for: Redness, swelling, or  pain. Fluid or blood. Pus or a bad smell. Protect the skin around the port site. Avoid wearing bra straps that rub or irritate the site. Protect the skin around your port from seat belts. Place a soft pad over your chest if needed. Bathe or shower as told by your health care provider. The site may get wet as long as you are not actively receiving an infusion. Return to your normal activities as told by your health care provider. Ask your health care provider what activities are safe for you. Carry a medical alert card or wear a medical alert bracelet at all times. This will let health care providers know that you have an implanted port in case of an emergency. Get help right away if: You have redness, swelling, or pain at the port site. You have fluid or blood coming from your port site. You have pus or a bad smell coming from the port site. You have a fever. Summary Implanted ports are usually placed in the chest for long-term IV access. Follow instructions from your health care provider about flushing the port and changing bandages (dressings). Take care of the area around your port by avoiding clothing that puts pressure on the area, and by watching for signs of infection. Protect the skin around your port from seat belts. Place a soft pad over your chest if needed. Get help right away if you have a fever or you have redness, swelling, pain, drainage, or a bad smell at the port site. This information is not intended to replace advice given to you by your health care provider. Make sure you discuss any questions you have with your health care provider. Document Revised: 01/23/2021 Document Reviewed: 03/19/2020 Elsevier Patient Education  [place].

## 2021-09-17 NOTE — Progress Notes (Signed)
[facility] OFFICE PROGRESS NOTE   Diagnosis: Rectal cancer  INTERVAL HISTORY:   [name] returns as scheduled.  She completed cycle 4 FOLFOX 09/02/2021.  She was seen in the emergency department 09/05/2021 after a fall.  CTs had, maxillofacial, cervical spine with no acute findings.  White blood cell count was markedly elevated felt to be secondary to white cell growth factor support.  She reports having a headache for about a week after the fall as well as back pain, both have resolved.  No nausea or vomiting.  She had 1 or 2 mouth sores after chemo.  She had watery stools after chemo for 4 to 5 days.  She did not take Imodium.  She has mild persistent cold sensitivity.  No change in baseline neuropathy symptoms.  Her son accompanies her to today's appointment.    Objective:  Vital signs in last 24 hours:  Blood pressure (!) 128/55, pulse 90, temperature 98 F (36.7 C), temperature source Oral, resp. rate 18, height 5' (1.524 m), weight 133 lb 12.8 oz (60.7 kg), SpO2 100 %.    HEENT: No thrush or ulcers. Resp: Lungs clear bilaterally. Cardio: Regular rate and rhythm. GI: Abdomen soft and nontender.  No hepatomegaly.  Left lower quadrant colostomy with soft brown stool in the collection bag. Vascular: No significant leg edema. Neuro: Vibratory sense mildly decreased over the fingertips per tuning fork exam. Skin: Palms without erythema. Port-A-Cath without erythema   Lab Results:  Lab Results  Component Value Date   WBC 63.6 (HH) 09/05/2021   HGB 9.0 (L) 09/05/2021   HCT 29.7 (L) 09/05/2021   MCV 95.8 09/05/2021   PLT 253 09/05/2021   NEUTROABS 63.0 (H) 09/05/2021    Imaging:  No results found.  Medications: I have reviewed the patient's current medications.  Assessment/Plan: Rectal cancer Partially obstructing mass beginning at 10 cm from the anal verge on colonoscopy 01/23/2021, biopsy-adenocarcinoma, immunohistochemical stains at [facility]-CK20, CDX-2,  and SATB2 positive, patchy strong staining for p16.  The rectal tumor has a distinct immunohistochemical profile suggesting a primary colorectal tumor as opposed to metastatic cervical cancer. CT abdomen/pelvis 01/18/2021-bilateral hydronephrosis, endometrial fluid collection, prominent stool in the colon MRI pelvis 01/23/2021-tumor at 7.6 cm from the anal verge, abnormal signal bridge in the cervix, mesorectum, and anterior aspect of the rectum-similar to MRI from 2020, MRI stage could not be defined secondary to posttreatment changes in the pelvis 03/18/2021-total abdominal hysterectomy, cystoscopy with insertion of ureteral stents, exploratory laparotomy, aborted low anterior resection, descending loop colostomy--right and left fallopian tubes and ovaries negative for malignancy; excision portion of cervix positive for adenocarcinoma; uterus with invasive moderately differentiated adenocarcinoma of the cervix, HPV associated, invading through full-thickness of the cervix/lower uterine segment and into parametrial tissue, margins of resection received disrupted, cannot be evaluated; remaining uterus with extensive endocervical adenocarcinoma in situ involving lower uterine segment and replacing endometrium.  This carcinoma was felt to be a separate primary from the rectal tumor. PET scan at [facility] 04/11/2021-intense FDG activity in region of known rectal mass.  Hypermetabolic left external iliac and right inguinal lymph nodes.  Minimal FDG uptake and small right supraclavicular lymph node.  No uptake seen in the cervix. 04/23/2021 FNA right inguinal lymph node-negative for malignancy Evaluated by Dr. Leamon Arnt 05/21/2021, 05/28/2021-appears to have 2 synchronous malignancies (cervical adenocarcinoma and rectal adenocarcinoma); further surgery which would entail pelvic exenteration not recommended by gynecologic oncology or colorectal surgery.  Full doses of radiotherapy not felt to be feasible.  Recommended 3 months of CAPOX  and then repeat MRI of abdomen/pelvis, chest CT and colonoscopy. Cycle 1 Xeloda 06/13/2021 Cycle 1 FOLFOX 07/08/2021, oxaliplatin dose reduced secondary to pre-existing neuropathy Cycle 2 FOLFOX 07/29/2021, Udenyca Cycle 3 FOLFOX 08/12/2021, Udenyca Cycle 4 FOLFOX 09/02/2021, Udenyca Cycle 5 FOLFOX 09/17/2021, Udenyca     Bilateral hydronephrosis-secondary to urinary retention? Hematometra found on pelvic MRI 01/23/2021, evaluated by gynecologic oncology Cervical cancer November 1994, stage Ib, treated with external beam radiation and brachytherapy at [facility] Diabetes Neuropathy Chronic pain secondary to #6 Recurrent urinary tract infections History of a CVA History of hyperthyroidism G3, P3 COVID-19 infection January 2021 Admission 08/25/2021 with a urinary tract infection-culture negative Anemia secondary to chronic disease, chemotherapy, and rectal bleeding-1 unit packed red blood cells 08/26/2021     Disposition: [name] appears stable.  She has completed 5 cycles of systemic therapy.  Plan to proceed with cycle 6 (FOLFOX) today as scheduled.  Her family will contact [facility] today to schedule follow-up to determine future plans.  We reviewed the CBC from today.  Counts adequate to proceed with treatment.  We recommend Udenyca with this cycle.  We discussed the elevated white count when she was in the emergency department.  They understand this was likely due to previous Udenyca.  She has progressive anemia.  She is not particularly symptomatic.  We discussed a blood transfusion later this week.  She would like to return for a repeat CBC and possible blood transfusion early next week.  She understands to contact the office prior to that appointment with signs/symptoms suggestive of worsening anemia.  She will return for follow-up after the appointment at [facility].  Her family will let us know once that appointment has been scheduled.  Plan reviewed with Dr. Benay Spice.   Ned Card ANP/GNP-BC    09/17/2021  9:28 AM

## 2021-09-17 NOTE — Progress Notes (Signed)
Patient seen by Ned Card NP today  Vitals are within treatment parameters.  Labs reviewed by Ned Card NP and are within treatment parameters.  Per physician team, patient is ready for treatment and there are NO modifications to the treatment plan.

## 2021-09-18 DIAGNOSIS — E039 Hypothyroidism, unspecified: Secondary | ICD-10-CM | POA: Diagnosis not present

## 2021-09-18 DIAGNOSIS — M545 Low back pain, unspecified: Secondary | ICD-10-CM | POA: Diagnosis not present

## 2021-09-18 DIAGNOSIS — M5440 Lumbago with sciatica, unspecified side: Secondary | ICD-10-CM | POA: Diagnosis not present

## 2021-09-18 DIAGNOSIS — M791 Myalgia, unspecified site: Secondary | ICD-10-CM | POA: Diagnosis not present

## 2021-09-18 DIAGNOSIS — G8929 Other chronic pain: Secondary | ICD-10-CM | POA: Diagnosis not present

## 2021-09-18 DIAGNOSIS — R69 Illness, unspecified: Secondary | ICD-10-CM | POA: Diagnosis not present

## 2021-09-18 DIAGNOSIS — G894 Chronic pain syndrome: Secondary | ICD-10-CM | POA: Diagnosis not present

## 2021-09-18 DIAGNOSIS — M5417 Radiculopathy, lumbosacral region: Secondary | ICD-10-CM | POA: Diagnosis not present

## 2021-09-18 DIAGNOSIS — E089 Diabetes mellitus due to underlying condition without complications: Secondary | ICD-10-CM | POA: Diagnosis not present

## 2021-09-18 DIAGNOSIS — M48061 Spinal stenosis, lumbar region without neurogenic claudication: Secondary | ICD-10-CM | POA: Diagnosis not present

## 2021-09-19 ENCOUNTER — Inpatient Hospital Stay: Payer: Medicare HMO

## 2021-09-19 ENCOUNTER — Other Ambulatory Visit: Payer: Self-pay

## 2021-09-19 VITALS — BP 102/59 | HR 79 | Temp 98.2°F | Resp 18

## 2021-09-19 DIAGNOSIS — N136 Pyonephrosis: Secondary | ICD-10-CM | POA: Diagnosis not present

## 2021-09-19 DIAGNOSIS — Z5111 Encounter for antineoplastic chemotherapy: Secondary | ICD-10-CM | POA: Diagnosis not present

## 2021-09-19 DIAGNOSIS — C539 Malignant neoplasm of cervix uteri, unspecified: Secondary | ICD-10-CM | POA: Diagnosis not present

## 2021-09-19 DIAGNOSIS — C20 Malignant neoplasm of rectum: Secondary | ICD-10-CM

## 2021-09-19 DIAGNOSIS — D6481 Anemia due to antineoplastic chemotherapy: Secondary | ICD-10-CM | POA: Diagnosis not present

## 2021-09-19 MED ORDER — PEGFILGRASTIM-CBQV 6 MG/0.6ML ~~LOC~~ SOSY
6.0000 mg | PREFILLED_SYRINGE | Freq: Once | SUBCUTANEOUS | Status: AC
  Administered 2021-09-19: 6 mg via SUBCUTANEOUS

## 2021-09-19 MED ORDER — SODIUM CHLORIDE 0.9% FLUSH
10.0000 mL | INTRAVENOUS | Status: DC | PRN
  Administered 2021-09-19: 10 mL

## 2021-09-19 MED ORDER — HEPARIN SOD (PORK) LOCK FLUSH 100 UNIT/ML IV SOLN
500.0000 [IU] | Freq: Once | INTRAVENOUS | Status: AC | PRN
  Administered 2021-09-19: 500 [IU]

## 2021-09-19 NOTE — Patient Instructions (Signed)
[facility]  Discharge Instructions: Thank you for choosing [facility] to provide your oncology and hematology care.   If you have a lab appointment with the [name], please go directly to the [facility] and check in at the registration area.   Wear comfortable clothing and clothing appropriate for easy access to any Portacath or PICC line.   We strive to give you quality time with your provider. You may need to reschedule your appointment if you arrive late (15 or more minutes).  Arriving late affects you and other patients whose appointments are after yours.  Also, if you miss three or more appointments without notifying the office, you may be dismissed from the clinic at the provider's discretion.      For prescription refill requests, have your pharmacy contact our office and allow 72 hours for refills to be completed.    Today you received the following Udenyca   To help prevent nausea and vomiting after your treatment, we encourage you to take your nausea medication as directed.  BELOW ARE SYMPTOMS THAT SHOULD BE REPORTED IMMEDIATELY: *FEVER GREATER THAN 100.4 F (38 C) OR HIGHER *CHILLS OR SWEATING *NAUSEA AND VOMITING THAT IS NOT CONTROLLED WITH YOUR NAUSEA MEDICATION *UNUSUAL SHORTNESS OF BREATH *UNUSUAL BRUISING OR BLEEDING *URINARY PROBLEMS (pain or burning when urinating, or frequent urination) *BOWEL PROBLEMS (unusual diarrhea, constipation, pain near the anus) TENDERNESS IN MOUTH AND THROAT WITH OR WITHOUT PRESENCE OF ULCERS (sore throat, sores in mouth, or a toothache) UNUSUAL RASH, SWELLING OR PAIN  UNUSUAL VAGINAL DISCHARGE OR ITCHING   Items with * indicate a potential emergency and should be followed up as soon as possible or go to the Emergency Department if any problems should occur.  Please show the CHEMOTHERAPY ALERT CARD or IMMUNOTHERAPY ALERT CARD at check-in to the Emergency Department and triage  nurse.  Should you have questions after your visit or need to cancel or reschedule your appointment, please contact [facility]  Dept: [phone]  and follow the prompts.  Office hours are 8:00 a.m. to 4:30 p.m. Monday - Friday. Please note that voicemails left after 4:00 p.m. may not be returned until the following business day.  We are closed weekends and major holidays. You have access to a nurse at all times for urgent questions. Please call the main number to the clinic Dept: [phone] and follow the prompts.   For any non-urgent questions, you may also contact your provider using MyChart. We now offer e-Visits for anyone 56 and older to request care online for non-urgent symptoms. For details visit [email or web address].   Also download the MyChart app! Go to the app store, search "MyChart", open the app, select [facility], and log in with your MyChart username and password.  Due to Covid, a mask is required upon entering the hospital/clinic. If you do not have a mask, one will be given to you upon arrival. For doctor visits, patients may have 1 support person aged 77 or older with them. For treatment visits, patients cannot have anyone with them due to current Covid guidelines and our immunocompromised population.   Pegfilgrastim injection What is this medication? PEGFILGRASTIM (PEG fil gra stim) is a long-acting granulocyte colony-stimulating factor that stimulates the growth of neutrophils, a type of white blood cell important in the body's fight against infection. It is used to reduce the incidence of fever and infection in patients with certain types of cancer who are  receiving chemotherapy that affects the bone marrow, and to increase survival after being exposed to high doses of radiation. This medicine may be used for other purposes; ask your health care provider or pharmacist if you have questions. COMMON BRAND NAME(S): Rexene Edison, Ziextenzo What should I tell my care team before I take this medication? They need to know if you have any of these conditions: kidney disease latex allergy ongoing radiation therapy sickle cell disease skin reactions to acrylic adhesives (On-Body Injector only) an unusual or allergic reaction to pegfilgrastim, filgrastim, other medicines, foods, dyes, or preservatives pregnant or trying to get pregnant breast-feeding How should I use this medication? This medicine is for injection under the skin. If you get this medicine at home, you will be taught how to prepare and give the pre-filled syringe or how to use the On-body Injector. Refer to the patient Instructions for Use for detailed instructions. Use exactly as directed. Tell your healthcare provider immediately if you suspect that the On-body Injector may not have performed as intended or if you suspect the use of the On-body Injector resulted in a missed or partial dose. It is important that you put your used needles and syringes in a special sharps container. Do not put them in a trash can. If you do not have a sharps container, call your pharmacist or healthcare provider to get one. Talk to your pediatrician regarding the use of this medicine in children. While this drug may be prescribed for selected conditions, precautions do apply. Overdosage: If you think you have taken too much of this medicine contact a poison control center or emergency room at once. NOTE: This medicine is only for you. Do not share this medicine with others. What if I miss a dose? It is important not to miss your dose. Call your doctor or health care professional if you miss your dose. If you miss a dose due to an On-body Injector failure or leakage, a new dose should be administered as soon as possible using a single prefilled syringe for manual use. What may interact with this medication? Interactions have not been studied. This list may not describe all  possible interactions. Give your health care provider a list of all the medicines, herbs, non-prescription drugs, or dietary supplements you use. Also tell them if you smoke, drink alcohol, or use illegal drugs. Some items may interact with your medicine. What should I watch for while using this medication? Your condition will be monitored carefully while you are receiving this medicine. You may need blood work done while you are taking this medicine. Talk to your health care provider about your risk of cancer. You may be more at risk for certain types of cancer if you take this medicine. If you are going to need a MRI, CT scan, or other procedure, tell your doctor that you are using this medicine (On-Body Injector only). What side effects may I notice from receiving this medication? Side effects that you should report to your doctor or health care professional as soon as possible: allergic reactions (skin rash, itching or hives, swelling of the face, lips, or tongue) back pain dizziness fever pain, redness, or irritation at site where injected pinpoint red spots on the skin red or dark-brown urine shortness of breath or breathing problems stomach or side pain, or pain at the shoulder swelling tiredness trouble passing urine or change in the amount of urine unusual bruising or bleeding Side effects that usually do not require  medical attention (report to your doctor or health care professional if they continue or are bothersome): bone pain muscle pain This list may not describe all possible side effects. Call your doctor for medical advice about side effects. You may report side effects to FDA at 1-800-FDA-1088. Where should I keep my medication? Keep out of the reach of children. If you are using this medicine at home, you will be instructed on how to store it. Throw away any unused medicine after the expiration date on the label. NOTE: This sheet is a summary. It may not cover all  possible information. If you have questions about this medicine, talk to your doctor, pharmacist, or health care provider.  2022 Elsevier/Gold Standard (2020-11-30 11:54:14)

## 2021-09-23 ENCOUNTER — Inpatient Hospital Stay (HOSPITAL_BASED_OUTPATIENT_CLINIC_OR_DEPARTMENT_OTHER): Payer: Medicare HMO | Admitting: Nurse Practitioner

## 2021-09-23 ENCOUNTER — Other Ambulatory Visit: Payer: Self-pay

## 2021-09-23 ENCOUNTER — Encounter (HOSPITAL_COMMUNITY): Payer: Self-pay | Admitting: Internal Medicine

## 2021-09-23 ENCOUNTER — Inpatient Hospital Stay: Payer: Medicare HMO

## 2021-09-23 ENCOUNTER — Encounter: Payer: Self-pay | Admitting: Nurse Practitioner

## 2021-09-23 ENCOUNTER — Observation Stay (HOSPITAL_COMMUNITY)
Admission: AD | Admit: 2021-09-23 | Discharge: 2021-09-24 | Disposition: A | Discharge status: Home / Self Care | Payer: Medicare HMO | Source: Ambulatory Visit | Attending: Internal Medicine | Admitting: Internal Medicine

## 2021-09-23 VITALS — BP 103/53 | HR 78 | Temp 98.7°F | Resp 18 | Ht 60.0 in

## 2021-09-23 DIAGNOSIS — Z96642 Presence of left artificial hip joint: Secondary | ICD-10-CM | POA: Diagnosis not present

## 2021-09-23 DIAGNOSIS — D500 Iron deficiency anemia secondary to blood loss (chronic): Secondary | ICD-10-CM | POA: Diagnosis present

## 2021-09-23 DIAGNOSIS — Z8541 Personal history of malignant neoplasm of cervix uteri: Secondary | ICD-10-CM | POA: Insufficient documentation

## 2021-09-23 DIAGNOSIS — Z7982 Long term (current) use of aspirin: Secondary | ICD-10-CM | POA: Diagnosis not present

## 2021-09-23 DIAGNOSIS — E039 Hypothyroidism, unspecified: Secondary | ICD-10-CM | POA: Diagnosis present

## 2021-09-23 DIAGNOSIS — Z7984 Long term (current) use of oral hypoglycemic drugs: Secondary | ICD-10-CM | POA: Insufficient documentation

## 2021-09-23 DIAGNOSIS — C20 Malignant neoplasm of rectum: Secondary | ICD-10-CM

## 2021-09-23 DIAGNOSIS — Z794 Long term (current) use of insulin: Secondary | ICD-10-CM | POA: Diagnosis not present

## 2021-09-23 DIAGNOSIS — E119 Type 2 diabetes mellitus without complications: Secondary | ICD-10-CM | POA: Diagnosis not present

## 2021-09-23 DIAGNOSIS — Z79899 Other long term (current) drug therapy: Secondary | ICD-10-CM | POA: Diagnosis not present

## 2021-09-23 DIAGNOSIS — Z7902 Long term (current) use of antithrombotics/antiplatelets: Secondary | ICD-10-CM | POA: Insufficient documentation

## 2021-09-23 DIAGNOSIS — E1169 Type 2 diabetes mellitus with other specified complication: Secondary | ICD-10-CM | POA: Diagnosis present

## 2021-09-23 DIAGNOSIS — D649 Anemia, unspecified: Secondary | ICD-10-CM | POA: Diagnosis not present

## 2021-09-23 DIAGNOSIS — Z87891 Personal history of nicotine dependence: Secondary | ICD-10-CM | POA: Diagnosis not present

## 2021-09-23 DIAGNOSIS — E1110 Type 2 diabetes mellitus with ketoacidosis without coma: Secondary | ICD-10-CM

## 2021-09-23 LAB — CBC WITH DIFFERENTIAL (CANCER CENTER ONLY)
Abs Immature Granulocytes: 0.43 10*3/uL — ABNORMAL HIGH (ref 0.00–0.07)
Basophils Absolute: 0 10*3/uL (ref 0.0–0.1)
Basophils Relative: 0 %
Eosinophils Absolute: 0 10*3/uL (ref 0.0–0.5)
Eosinophils Relative: 0 %
HCT: 19.1 % — ABNORMAL LOW (ref 36.0–46.0)
Hemoglobin: 5.9 g/dL — CL (ref 12.0–15.0)
Immature Granulocytes: 2 %
Lymphocytes Relative: 3 %
Lymphs Abs: 0.8 10*3/uL (ref 0.7–4.0)
MCH: 31.4 pg (ref 26.0–34.0)
MCHC: 30.9 g/dL (ref 30.0–36.0)
MCV: 101.6 fL — ABNORMAL HIGH (ref 80.0–100.0)
Monocytes Absolute: 0.1 10*3/uL (ref 0.1–1.0)
Monocytes Relative: 0 %
Neutro Abs: 21.5 10*3/uL — ABNORMAL HIGH (ref 1.7–7.7)
Neutrophils Relative %: 95 %
Platelet Count: 105 10*3/uL — ABNORMAL LOW (ref 150–400)
RBC: 1.88 MIL/uL — ABNORMAL LOW (ref 3.87–5.11)
RDW: 25 % — ABNORMAL HIGH (ref 11.5–15.5)
WBC Count: 22.9 10*3/uL — ABNORMAL HIGH (ref 4.0–10.5)
nRBC: 0.1 % (ref 0.0–0.2)

## 2021-09-23 LAB — SAMPLE TO BLOOD BANK

## 2021-09-23 LAB — PREPARE RBC (CROSSMATCH)

## 2021-09-23 MED ORDER — SODIUM CHLORIDE 0.9% IV SOLUTION: Freq: Once | INTRAVENOUS | Status: AC

## 2021-09-23 MED ORDER — DOCUSATE SODIUM 100 MG PO CAPS
100.0000 mg | ORAL_CAPSULE | Freq: Two times a day (BID) | ORAL | Status: DC
  Administered 2021-09-23 – 2021-09-24 (×2): 100 mg via ORAL
  Filled 2021-09-23 (×2): qty 1

## 2021-09-23 MED ORDER — ATORVASTATIN CALCIUM 40 MG PO TABS
40.0000 mg | ORAL_TABLET | Freq: Every morning | ORAL | Status: DC
  Administered 2021-09-24: 40 mg via ORAL

## 2021-09-23 MED ORDER — NYSTATIN 100000 UNIT/GM EX CREA: 1.0000 "application " | TOPICAL_CREAM | Freq: Two times a day (BID) | CUTANEOUS | Status: DC | PRN

## 2021-09-23 MED ORDER — BACLOFEN 10 MG PO TABS
10.0000 mg | ORAL_TABLET | Freq: Two times a day (BID) | ORAL | Status: DC
  Administered 2021-09-23 – 2021-09-24 (×2): 10 mg via ORAL
  Filled 2021-09-23 (×2): qty 1

## 2021-09-23 MED ORDER — ACETAMINOPHEN 325 MG PO TABS
650.0000 mg | ORAL_TABLET | Freq: Four times a day (QID) | ORAL | Status: DC | PRN
  Administered 2021-09-23 – 2021-09-24 (×2): 650 mg via ORAL
  Filled 2021-09-23 (×2): qty 2

## 2021-09-23 MED ORDER — METFORMIN HCL 500 MG PO TABS
500.0000 mg | ORAL_TABLET | Freq: Two times a day (BID) | ORAL | Status: DC
  Administered 2021-09-24: 500 mg via ORAL
  Filled 2021-09-23: qty 1

## 2021-09-23 MED ORDER — GABAPENTIN 300 MG PO CAPS
300.0000 mg | ORAL_CAPSULE | Freq: Every day | ORAL | Status: DC
  Administered 2021-09-24: 300 mg via ORAL
  Filled 2021-09-23: qty 1

## 2021-09-23 MED ORDER — POTASSIUM CHLORIDE CRYS ER 20 MEQ PO TBCR
20.0000 meq | EXTENDED_RELEASE_TABLET | Freq: Every day | ORAL | Status: DC
  Administered 2021-09-24: 20 meq via ORAL
  Filled 2021-09-23: qty 1

## 2021-09-23 MED ORDER — POLYETHYLENE GLYCOL 3350 17 G PO PACK: 17.0000 g | PACK | Freq: Two times a day (BID) | ORAL | Status: DC | PRN

## 2021-09-23 MED ORDER — LEVOTHYROXINE SODIUM 25 MCG PO TABS
125.0000 ug | ORAL_TABLET | Freq: Every day | ORAL | Status: DC
  Administered 2021-09-24: 125 ug via ORAL
  Filled 2021-09-23: qty 1

## 2021-09-23 MED ORDER — FERROUS SULFATE 325 (65 FE) MG PO TABS
325.0000 mg | ORAL_TABLET | Freq: Two times a day (BID) | ORAL | Status: DC
  Administered 2021-09-24: 325 mg via ORAL
  Filled 2021-09-23: qty 1

## 2021-09-23 MED ORDER — GABAPENTIN 300 MG PO CAPS
300.0000 mg | ORAL_CAPSULE | ORAL | Status: DC
Start: 2021-09-23 — End: 2021-09-23

## 2021-09-23 MED ORDER — ONDANSETRON HCL 4 MG/2ML IJ SOLN: 4.0000 mg | Freq: Four times a day (QID) | INTRAMUSCULAR | Status: DC | PRN

## 2021-09-23 MED ORDER — OXYCODONE HCL ER 10 MG PO T12A
10.0000 mg | EXTENDED_RELEASE_TABLET | Freq: Two times a day (BID) | ORAL | Status: DC
  Administered 2021-09-23 – 2021-09-24 (×2): 10 mg via ORAL
  Filled 2021-09-23 (×2): qty 1

## 2021-09-23 MED ORDER — GABAPENTIN 300 MG PO CAPS
600.0000 mg | ORAL_CAPSULE | Freq: Every day | ORAL | Status: DC
  Administered 2021-09-23: 600 mg via ORAL
  Filled 2021-09-23: qty 2

## 2021-09-23 MED ORDER — KETOROLAC TROMETHAMINE 15 MG/ML IJ SOLN
15.0000 mg | Freq: Once | INTRAMUSCULAR | Status: AC
  Administered 2021-09-23: 15 mg via INTRAVENOUS
  Filled 2021-09-23: qty 1

## 2021-09-23 MED ORDER — SODIUM CHLORIDE 0.9 % IV SOLN: INTRAVENOUS | Status: DC

## 2021-09-23 MED ORDER — ONDANSETRON HCL 4 MG PO TABS: 4.0000 mg | ORAL_TABLET | Freq: Four times a day (QID) | ORAL | Status: DC | PRN

## 2021-09-23 MED ORDER — ACETAMINOPHEN 650 MG RE SUPP: 650.0000 mg | Freq: Four times a day (QID) | RECTAL | Status: DC | PRN

## 2021-09-23 NOTE — Progress Notes (Signed)
[facility] OFFICE PROGRESS NOTE   Diagnosis: Rectal cancer  INTERVAL HISTORY:   [name] presents to the office today for a follow-up CBC.  Her daughter requested evaluation.  [name] completed cycle 5 FOLFOX 09/17/2021.  Hemoglobin at that time returned at 7.6.  She was not particularly symptomatic.  Family requested to return today for a repeat CBC and possible blood transfusion.  She has been weak and sleeping more for the past 3 days.  Fairly good fluid intake.  Poor oral intake.  They are not aware of any bleeding.  No nausea or vomiting.  No diarrhea.  No fever.  She complains of a headache.  Daughter reports she complains of mouth soreness.    Objective:  Vital signs in last 24 hours:  Blood pressure (!) 103/53, pulse 78, temperature 98.7 F (37.1 C), temperature source Oral, resp. rate 18, height 5' (1.524 m), SpO2 97 %.    HEENT: No thrush or ulcers. Resp: Lungs clear bilaterally. Cardio: Regular rate and rhythm. GI: Abdomen is soft, generalized tenderness.  Dark stool in the colostomy collection bag. Vascular: No leg edema. Neuro: Alert, follows commands. Skin: Poor skin turgor. Port-A-Cath without erythema.   Lab Results:  Lab Results  Component Value Date   WBC 22.9 (H) 09/23/2021   HGB 5.9 (LL) 09/23/2021   HCT 19.1 (L) 09/23/2021   MCV 101.6 (H) 09/23/2021   PLT 105 (L) 09/23/2021   NEUTROABS 21.5 (H) 09/23/2021    Imaging:  No results found.  Medications: I have reviewed the patient's current medications.  Assessment/Plan: Rectal cancer Partially obstructing mass beginning at 10 cm from the anal verge on colonoscopy 01/23/2021, biopsy-adenocarcinoma, immunohistochemical stains at [facility]-CK20, CDX-2, and SATB2 positive, patchy strong staining for p16.  The rectal tumor has a distinct immunohistochemical profile suggesting a primary colorectal tumor as opposed to metastatic cervical cancer. CT abdomen/pelvis 01/18/2021-bilateral  hydronephrosis, endometrial fluid collection, prominent stool in the colon MRI pelvis 01/23/2021-tumor at 7.6 cm from the anal verge, abnormal signal bridge in the cervix, mesorectum, and anterior aspect of the rectum-similar to MRI from 2020, MRI stage could not be defined secondary to posttreatment changes in the pelvis 03/18/2021-total abdominal hysterectomy, cystoscopy with insertion of ureteral stents, exploratory laparotomy, aborted low anterior resection, descending loop colostomy--right and left fallopian tubes and ovaries negative for malignancy; excision portion of cervix positive for adenocarcinoma; uterus with invasive moderately differentiated adenocarcinoma of the cervix, HPV associated, invading through full-thickness of the cervix/lower uterine segment and into parametrial tissue, margins of resection received disrupted, cannot be evaluated; remaining uterus with extensive endocervical adenocarcinoma in situ involving lower uterine segment and replacing endometrium.  This carcinoma was felt to be a separate primary from the rectal tumor. PET scan at [facility] 04/11/2021-intense FDG activity in region of known rectal mass.  Hypermetabolic left external iliac and right inguinal lymph nodes.  Minimal FDG uptake and small right supraclavicular lymph node.  No uptake seen in the cervix. 04/23/2021 FNA right inguinal lymph node-negative for malignancy Evaluated by Dr. Leamon Arnt 05/21/2021, 05/28/2021-appears to have 2 synchronous malignancies (cervical adenocarcinoma and rectal adenocarcinoma); further surgery which would entail pelvic exenteration not recommended by gynecologic oncology or colorectal surgery.  Full doses of radiotherapy not felt to be feasible.  Recommended 3 months of CAPOX and then repeat MRI of abdomen/pelvis, chest CT and colonoscopy. Cycle 1 Xeloda 06/13/2021 Cycle 1 FOLFOX 07/08/2021, oxaliplatin dose reduced secondary to pre-existing neuropathy Cycle 2 FOLFOX 07/29/2021, Udenyca Cycle 3 FOLFOX  08/12/2021, Ellen Henri  Cycle 4 FOLFOX 09/02/2021, Udenyca Cycle 5 FOLFOX 09/17/2021, Udenyca     Bilateral hydronephrosis-secondary to urinary retention? Hematometra found on pelvic MRI 01/23/2021, evaluated by gynecologic oncology Cervical cancer November 1994, stage Ib, treated with external beam radiation and brachytherapy at [facility] Diabetes Neuropathy Chronic pain secondary to #6 Recurrent urinary tract infections History of a CVA History of hyperthyroidism G3, P3 COVID-19 infection January 2021 Admission 08/25/2021 with a urinary tract infection-culture negative Anemia secondary to chronic disease, chemotherapy, and rectal bleeding-1 unit packed red blood cells 08/26/2021      Disposition: [name] has rectal cancer and recurrent cervical cancer.  She has completed 5 cycles of chemotherapy.  She presents today for a follow-up CBC.  She has progressive anemia and is symptomatic.  Dr. Benay Spice has spoken with the hospitalist at [facility] with the plan to admit for transfusion support/further evaluation.  She will return for follow-up here as scheduled.  Patient seen with Dr. Benay Spice.    Ned Card ANP/GNP-BC   09/23/2021  11:14 AM  This was a shared visit with Ned Card.  [name] was interviewed and examined.  She completed cycle 5 of FOLFOX 09/17/2021.  She is scheduled for a restaging evaluation at [facility] next week.  She presents today with symptomatic anemia.  The anemia may be related to chronic disease and chemotherapy.  She could also have anemia secondary to GI blood loss.  She denies visible bleeding.  [name] will be admitted for transfusion support and further evaluation.  We discussed the plan with [name] and her daughter.  I contacted the hospitalist service to arrange for a direct admission.  I will check on her 09/24/2021.  I was present for greater than 50% of today's visit.  I performed medical decision making.  Julieanne Manson, MD

## 2021-09-23 NOTE — Progress Notes (Signed)
CRITICAL VALUE STICKER  CRITICAL VALUE:Hgb 5.9   RECEIVER (on-site recipient of call):Sundy Houchins,RN  DATE & TIME NOTIFIED: 09/23/21 @ 1056   MESSENGER (representative from lab):Phyllis  MD NOTIFIED: Ned Card, NP  TIME OF NOTIFICATION: 1100  RESPONSE: Will admit to hospital

## 2021-09-23 NOTE — Plan of Care (Signed)

## 2021-09-23 NOTE — H&P (Addendum)
History and Physical    [name] OIP:[id number] DOB: [birth year] DOA: 09/23/2021  PCP: Everardo Beals, NP   Patient coming from: Cancer center.   I have personally briefly reviewed patient's old medical records in [place]  Chief Complaint: Anemia.  HPI: [name] is a 75 y.o. female with below past medical history that was sent by Dr. Myles Lipps from the cancer center for symptomatic anemia with a hemoglobin level of 5.9 g/dL..  The patient has been very weak, somnolent and occasionally lightheaded, particularly with positional changes.  She has had a frontal headache, occasional rhinorrhea, but no sore throat, wheezing or hemoptysis.  Chest pain, palpitations, diaphoresis, PND orthopnea.  She occasionally gets lower extremity edema.  Has chronic abdominal pain, but no recent nausea, emesis or hematochezia.  She has had melanotic stools from her ostomy but also takes iron twice daily.  Denied flank pain, dysuria, frequency or hematuria.  No polyuria, polydipsia, polyphagia or blurred vision.  Review of Systems: As per HPI otherwise all other systems reviewed and are negative.  Past Medical History:  Diagnosis Date   Arthritis    Basal ganglia stroke ([place])    Bilateral lower extremity edema    Cancer ([place])    Carotid artery occlusion    Flank pain    Full dentures    History of cervical cancer    11/ 1994  Stage IB  s/p  high dose radiation brachytherapy @ [facility] 01/ 1995--- per pt no recurrence   History of chronic bronchitis    History of hyperthyroidism    d 03/ 2011---due to grave's disease--- s/p  RAI treatment 04/ 2011   History of kidney stones    History of sepsis 05/03/2018   due to UTI with klebsiella/ pyelonephritis/ ureteral obstruction cause by stone   Hyperlipidemia    Hypothyroidism, postradioiodine therapy    endocrinologist--  dr Loanne Drilling--  dx graves disease and s/p RAI i131 treatement 4/ 2011   Nauseated    Oral thrush 05/03/2018    Pneumonia    Type 2 diabetes mellitus treated with insulin ([place])    FOLLOWED BY PCP   Urgency of urination    Wears glasses     Past Surgical History:  Procedure Laterality Date   APPENDECTOMY     BIOPSY  01/23/2021   Procedure: BIOPSY;  Surgeon: Mauri Pole, MD;  Location: [facility];  Service: Endoscopy;;   CAROTID ENDARTERECTOMY Right 03/28/2020   CATARACT EXTRACTION W/ INTRAOCULAR LENS  IMPLANT, BILATERAL  2004  approx.   COLONOSCOPY WITH PROPOFOL N/A 01/23/2021   Procedure: COLONOSCOPY WITH PROPOFOL;  Surgeon: Mauri Pole, MD;  Location: [facility];  Service: Endoscopy;  Laterality: N/A;   CYSTOSCOPY WITH STENT PLACEMENT Right 05/04/2018   Procedure: CYSTOSCOPY WITH STENT PLACEMENT AND RETROGRADE PYELOGRAM;  Surgeon: Ceasar Mons, MD;  Location: [facility];  Service: Urology;  Laterality: Right;   CYSTOSCOPY/URETEROSCOPY/HOLMIUM LASER/STENT PLACEMENT Right 05/19/2018   Procedure: CYSTOSCOPY, URETEROSCOPY/HOLMIUM LASER, STONE BASKETRY/ STENT EXCHANGE;  Surgeon: Ceasar Mons, MD;  Location: [facility];  Service: Urology;  Laterality: Right;   ENDARTERECTOMY Right 03/28/2020   Procedure: RIGHT CAROTID ENDARTERECTOMY with PATCH ANGIOPLASTY;  Surgeon: Rosetta Posner, MD;  Location: [facility];  Service: Vascular;  Laterality: Right;   EXCISION MASS LEFT CHEST WALL  09-20-2007   dr Grandville Silos  [facility]   Radioactive Iodine Therapy     for thyroid   REVISION TOTAL HIP ARTHROPLASTY Left early 2000s  SUBMUCOSAL TATTOO INJECTION  01/23/2021   Procedure: SUBMUCOSAL TATTOO INJECTION;  Surgeon: Mauri Pole, MD;  Location: [facility];  Service: Endoscopy;;   TANDEM RING INSERTION  1995   dr Aldean Ast @ [facility]   EUA w/ tandem placement in ovid  (for direct high dose radiation brachytherapy , cervical cancer)   TOTAL HIP ARTHROPLASTY Left 1990s   TRANSTHORACIC ECHOCARDIOGRAM  08/07/2017   mild focal basal hypertrophy of the septum,  ef 60-65%,  grade 1 diastolic dysfunction/  atrial septum with lipomatous hypertrophy/  trivial PR   Social History  reports that she quit smoking about 39 years ago. Her smoking use included cigarettes. She has never used smokeless tobacco. She reports that she does not drink alcohol and does not use drugs.  Allergies  Allergen Reactions   Codeine Nausea Only   Family History  Problem Relation Age of Onset   Emphysema Father    Diabetes Father    Emphysema Sister    Emphysema Brother    Cancer Mother        Skin Cancer   Diabetes Mother    Colon cancer Neg Hx    Esophageal cancer Neg Hx    Rectal cancer Neg Hx    Stomach cancer Neg Hx    Prior to Admission medications   Medication Sig Start Date End Date Taking? Authorizing Provider  acetaminophen (TYLENOL) 500 MG tablet Take 500 mg by mouth daily as needed for headache (pain).   Yes [provider]  aspirin 81 MG EC tablet Take 1 tablet (81 mg total) by mouth daily. RESUME AFTER 1 WEEK Patient taking differently: Take 81 mg by mouth every morning. 09/03/21  Yes Bonnielee Haff, MD  atorvastatin (LIPITOR) 40 MG tablet Take 40 mg by mouth every morning. 12/15/19  Yes [provider]  baclofen (LIORESAL) 10 MG tablet Take 10 mg by mouth 2 (two) times daily. 03/22/21  Yes [provider]  cephALEXin (KEFLEX) 250 MG capsule Take 1 capsule (250 mg total) by mouth daily. RESUME AFTER YOU HAVE COMPLETED THE COURSE OF CIPRO Patient taking differently: Take 250 mg by mouth every morning. 08/28/21  Yes Bonnielee Haff, MD  clopidogrel (PLAVIX) 75 MG tablet Take 1 tablet (75 mg total) by mouth daily. RESUME AFTER 1 WEEK ON 09/03/21 Patient taking differently: Take 75 mg by mouth at bedtime. 09/03/21  Yes Bonnielee Haff, MD  docusate sodium (COLACE) 100 MG capsule Take 100 mg by mouth 2 (two) times daily.   Yes [provider]  ferrous sulfate 325 (65 FE) MG tablet Take 1 tablet (325 mg total) by mouth 2 (two) times  daily with a meal. 08/28/21 11/26/21 Yes Bonnielee Haff, MD  gabapentin (NEURONTIN) 300 MG capsule Take 300-600 mg by mouth See admin instructions. Take one capsule (300 mg) by mouth every morning and two capsules (600 mg) at night   Yes [provider]  gemfibrozil (LOPID) 600 MG tablet Take 600 mg by mouth 2 (two) times daily.   Yes [provider]  insulin degludec (TRESIBA FLEXTOUCH) 100 UNIT/ML FlexTouch Pen Inject 10 Units into the skin daily. Patient taking differently: Inject 20 Units into the skin daily as needed (CBG >200). 08/28/21  Yes Bonnielee Haff, MD  levothyroxine (SYNTHROID) 125 MCG tablet Take 1 tablet (125 mcg total) by mouth daily at 6 (six) AM. 08/29/21 11/27/21 Yes Bonnielee Haff, MD  metFORMIN (GLUCOPHAGE) 500 MG tablet Take 500 mg by mouth 2 (two) times daily. 02/01/21  Yes [provider]  nystatin (MYCOSTATIN/NYSTOP) powder Apply 1 application topically 2 (two) times daily. To groin rash Patient taking differently: Apply 1 application topically 2 (two) times daily as needed (groin rash). 07/29/21  Yes Ladell Pier, MD  nystatin cream (MYCOSTATIN) Apply 1 application topically 2 (two) times daily as needed (groin rash). Mix with triamcinolone cream   Yes [provider]  oxyCODONE ER (XTAMPZA ER) 9 MG C12A Take 9 mg by mouth every 12 (twelve) hours.   Yes [provider]  polyethylene glycol powder (GLYCOLAX/MIRALAX) 17 GM/SCOOP powder Take 17 g by mouth 2 (two) times daily as needed (constipation).   Yes [provider]  potassium chloride SA (KLOR-CON) 20 MEQ tablet Take 1 tablet (20 mEq total) by mouth daily. 08/12/21  Yes Owens Shark, NP  triamcinolone cream (KENALOG) 0.1 % Apply 1 application topically 2 (two) times daily as needed (groin rash). Mix with nystatin cream 09/19/20  Yes [provider]  Lancets (ONETOUCH DELICA PLUS SWHQPR91M) MISC Apply topically 2 (two) times daily. 12/02/20   [provider]  lidocaine-prilocaine (EMLA) cream Apply to port site 1-2 hours prior to use Patient not taking: Reported on 09/23/2021 07/08/21   Owens Shark, NP  [facility] VERIO test strip 1 each 2 (two) times daily. 11/29/20   [provider]  senna-docusate (SENOKOT-S) 8.6-50 MG tablet Take 1 tablet by mouth 2 (two) times daily. Patient not taking: No sig reported 08/28/21 11/26/21  Bonnielee Haff, MD   Physical Exam: Vitals:   09/23/21 1430 09/23/21 1711 09/23/21 1731  BP: (!) 118/92 (!) 119/55 (!) 147/58  Pulse: 88 71 66  Resp: 19 19 19   Temp: 98.8 F (37.1 C) 98.5 F (36.9 C) 98.6 F (37 C)  TempSrc: Oral Oral Oral  SpO2: 98% 98% 99%   Constitutional: Chronically ill-appearing.  NAD, calm, comfortable Eyes: PERRL, lids and conjunctivae are pale. ENMT: Mucous membranes are moist. Posterior pharynx clear of any exudate or lesions.Normal dentition.  Neck: normal, supple, no masses, no thyromegaly Respiratory: clear to auscultation bilaterally, no wheezing, no crackles. Normal respiratory effort. No accessory muscle use.  Cardiovascular: Regular rate and rhythm, no murmurs / rubs / gallops. No extremity edema. 2+ pedal pulses. No carotid bruits.  Abdomen: Obese, no distention.  Soft, mild epigastric tenderness, no guarding or rebound, no masses palpated. No hepatosplenomegaly. Bowel sounds positive.  Musculoskeletal: Mild to moderate generalized weakness.  No clubbing / cyanosis.  Good ROM, no contractures. Normal muscle tone.  Skin: no rashes, lesions, ulcers on limited dermatological examination. Neurologic: CN 2-12 grossly intact. Sensation intact, DTR normal. Strength 5/5 in all 4.  Psychiatric: Normal judgment and insight. Alert and oriented x 3. Normal mood.   Labs on Admission: I have personally reviewed following labs and imaging studies  CBC: Recent Labs  Lab 09/17/21 0844 09/23/21 1011  WBC 10.2 22.9*  NEUTROABS 7.2 21.5*  HGB 7.6* 5.9*  HCT 25.2* 19.1*   MCV 98.8 101.6*  PLT 129* 105*    Basic Metabolic Panel: Recent Labs  Lab 09/17/21 0844  NA 140  K 3.6  CL 102  CO2 23  GLUCOSE 222*  BUN 12  CREATININE 1.02*  CALCIUM 8.1*   GFR: Estimated Creatinine Clearance: 38.8 mL/min (A) (by C-G formula based on SCr of 1.02 mg/dL (H)).  Liver Function Tests: Recent Labs  Lab 09/17/21 0844  AST 13*  ALT 7  ALKPHOS 161*  BILITOT 0.7  PROT 6.1*  ALBUMIN 3.2*   Radiological Exams  on Admission: No results found.  EKG: Independently reviewed.   Assessment/Plan Principal Problem:   Symptomatic anemia Due to   Iron deficiency anemia due to chronic blood loss Observation/telemetry. Transfuse 2 PRBC units. Monitor H&H. Temporarily held aspirin and clopidogrel. Continue iron supplementation. MiraLAX and stool softeners.      Hypothyroidism Continue levothyroxine 125 mcg p.o. daily.    Type 2 diabetes mellitus (HCC) Carbohydrate modified diet. Continue metformin 500 mg p.o. twice daily. CBG monitoring with R ISS.    Hyperlipidemia associated with type 2 diabetes mellitus (HCC) On gemfibrozil 600 mg p.o. twice daily.    Headache Would like something stronger than Tylenol. Toradol 15 mg IVP x1.  DVT prophylaxis: SCDs. Code Status:   Full code.  Family Communication:   Disposition Plan:   Patient is from:  Home.  Anticipated DC to:  Home.  Anticipated DC date:  09/24/2021.  Anticipated DC barriers: Clinical status.  Consults called:   Admission status:  Observation/telemetry.  Severity of Illness:  High severity after presenting with symptomatic anemia with a hemoglobin level of 5.9 g/dL.  The patient has being admitted for PRBC transfusion and close monitoring.  Reubin Milan MD [facility]  How to contact the [facility] Attending or Consulting provider [place] or covering provider during after hours [place], for this patient?   Check the care team in [facility] and look for a) attending/consulting [facility] provider  listed and b) the [facility] team listed Log into [email or web address] and use [place]'s universal password to access. If you do not have the password, please contact the hospital operator. Locate the [facility] provider you are looking for under [facility] and page to a number that you can be directly reached. If you still have difficulty reaching the provider, please page the [facility] : [facility] (Director on Call) for the Hospitalists listed on amion for assistance.  09/23/2021, 5:55 PM

## 2021-09-24 ENCOUNTER — Inpatient Hospital Stay: Payer: Medicare HMO

## 2021-09-24 ENCOUNTER — Other Ambulatory Visit: Payer: Self-pay | Admitting: *Deleted

## 2021-09-24 DIAGNOSIS — Z794 Long term (current) use of insulin: Secondary | ICD-10-CM | POA: Diagnosis not present

## 2021-09-24 DIAGNOSIS — K922 Gastrointestinal hemorrhage, unspecified: Secondary | ICD-10-CM | POA: Diagnosis not present

## 2021-09-24 DIAGNOSIS — Z87891 Personal history of nicotine dependence: Secondary | ICD-10-CM | POA: Diagnosis not present

## 2021-09-24 DIAGNOSIS — G629 Polyneuropathy, unspecified: Secondary | ICD-10-CM

## 2021-09-24 DIAGNOSIS — D649 Anemia, unspecified: Secondary | ICD-10-CM | POA: Diagnosis not present

## 2021-09-24 DIAGNOSIS — E039 Hypothyroidism, unspecified: Secondary | ICD-10-CM | POA: Diagnosis not present

## 2021-09-24 DIAGNOSIS — D6481 Anemia due to antineoplastic chemotherapy: Secondary | ICD-10-CM

## 2021-09-24 DIAGNOSIS — Z8541 Personal history of malignant neoplasm of cervix uteri: Secondary | ICD-10-CM

## 2021-09-24 DIAGNOSIS — E119 Type 2 diabetes mellitus without complications: Secondary | ICD-10-CM | POA: Diagnosis not present

## 2021-09-24 DIAGNOSIS — C20 Malignant neoplasm of rectum: Secondary | ICD-10-CM

## 2021-09-24 DIAGNOSIS — D500 Iron deficiency anemia secondary to blood loss (chronic): Secondary | ICD-10-CM

## 2021-09-24 DIAGNOSIS — Z96642 Presence of left artificial hip joint: Secondary | ICD-10-CM | POA: Diagnosis not present

## 2021-09-24 DIAGNOSIS — Z7984 Long term (current) use of oral hypoglycemic drugs: Secondary | ICD-10-CM | POA: Diagnosis not present

## 2021-09-24 DIAGNOSIS — G8929 Other chronic pain: Secondary | ICD-10-CM | POA: Diagnosis not present

## 2021-09-24 DIAGNOSIS — Z7982 Long term (current) use of aspirin: Secondary | ICD-10-CM | POA: Diagnosis not present

## 2021-09-24 DIAGNOSIS — Z7902 Long term (current) use of antithrombotics/antiplatelets: Secondary | ICD-10-CM | POA: Diagnosis not present

## 2021-09-24 LAB — PREPARE RBC (CROSSMATCH)

## 2021-09-24 LAB — HEMOGLOBIN AND HEMATOCRIT, BLOOD
HCT: 23.6 % — ABNORMAL LOW (ref 36.0–46.0)
Hemoglobin: 7.7 g/dL — ABNORMAL LOW (ref 12.0–15.0)

## 2021-09-24 LAB — OCCULT BLOOD X 1 CARD TO LAB, STOOL: Fecal Occult Bld: POSITIVE — AB

## 2021-09-24 MED ORDER — CHLORHEXIDINE GLUCONATE CLOTH 2 % EX PADS
6.0000 | MEDICATED_PAD | Freq: Every day | CUTANEOUS | Status: DC
  Administered 2021-09-24: 6 via TOPICAL

## 2021-09-24 MED ORDER — SODIUM CHLORIDE 0.9% IV SOLUTION: Freq: Once | INTRAVENOUS | Status: AC

## 2021-09-24 MED ORDER — HEPARIN SOD (PORK) LOCK FLUSH 100 UNIT/ML IV SOLN
500.0000 [IU] | Freq: Once | INTRAVENOUS | Status: AC
  Administered 2021-09-24: 500 [IU] via INTRAVENOUS
  Filled 2021-09-24: qty 5

## 2021-09-24 NOTE — Discharge Planning (Signed)
Oncology Discharge Planning Note  [facility] Address: [address] [place], [place], [place] [address] Hours of Operation:  Nena Polio, Monday - Friday  Clinic Contact Information:  [phone]) [phone]  Oncology Care Team: Medical Oncologist:  Dr. Betsy Coder  Patient Details: Name:  [name], [name] MRN:   [id number] DOB:   1946/11/09 Reason for Current Admission: Symptomatic anemia  Discharge Planning Narrative: Notification of admission received by Dr. Benay Spice for [facility].  Discharge follow-up appointments for oncology are current and available on the AVS and MyChart.   Upon discharge from the hospital, hematology/oncology's post discharge plan of care for the outpatient setting is: Follow up as scheduled and call for any shortness of breath, dizziness or symptoms of need for transfusion. Follow up at [facility] on 10/01/21 as scheduled to determine continued treatment plan  Ettel Albergo will be called within two business days after discharge to review hematology/oncology's plan of care for full understanding.    Outpatient Oncology Specific Care Only: Oncology appointment transportation needs addressed?:  yes Oncology medication management for symptom management addressed?:  yes Chemo Alert Card reviewed?:  yes Immunotherapy Alert Card reviewed?:  not applicable. Above instructions provided to her daughter, [name] today prior to discharge.

## 2021-09-24 NOTE — Discharge Instructions (Signed)
Follow up with Dr. Gearldine Shown office on 10/02/21 at 0900 for lab (CBC and sample to blood bank) via port. Call for any shortness of breath or worsening fatigue or overt bleeding.

## 2021-09-24 NOTE — Progress Notes (Signed)
Per Dr. Benay Spice: Needs lab/flush on 10/02/21--CBC and sample to blood bank. Orders placed.

## 2021-09-24 NOTE — Progress Notes (Addendum)
HEMATOLOGY-ONCOLOGY PROGRESS NOTE  SUBJECTIVE: [name] is currently undergoing treatment for rectal cancer.  She received cycle 5 of FOLFOX on 09/17/2021.  She also has a history of cervical cancer.  Was seen in our office yesterday and sent to the emergency department for symptomatic anemia.  She has received 2 units PRBC so far.  She reports improvement in her symptoms.  No bleeding reported but she does have darkened stools.  Hemoglobin this morning has improved to 7.7.  Oncology History  Rectal cancer ([place])  01/29/2021 Initial Diagnosis   Rectal cancer ([place])   01/29/2021 Cancer Staging   Staging form: Colon and Rectum, AJCC 8th Edition - Clinical: Stage Unknown (cTX, cNX, cM0) - Signed by Ladell Pier, MD on 01/29/2021    07/08/2021 -  Chemotherapy   Patient is on Treatment Plan : COLORECTAL FOLFOX q14d x 4 months      PHYSICAL EXAMINATION:  Vitals:   09/24/21 0215 09/24/21 0534  BP: (!) 129/41 (!) 138/50  Pulse: (!) 58 63  Resp: 16 16  Temp: 98.4 F (36.9 C) 98.3 F (36.8 C)  SpO2: 98% 100%   There were no vitals filed for this visit.  Intake/Output from previous day: 11/07 0701 - 11/08 0700 In: 1289.5 [P.O.:240; Blood:1049.5] Out: 0   GENERAL:alert, no distress and comfortable SKIN: skin color, texture, turgor are normal, no rashes or significant lesions EYES: normal, Conjunctiva are pink and non-injected, sclera clear OROPHARYNX:no exudate, no erythema and lips, buccal mucosa, and tongue normal  LUNGS: clear to auscultation and percussion with normal breathing effort HEART: regular rate & rhythm and no murmurs and no lower extremity edema ABDOMEN: Positive bowel sounds, soft, nontender, dark stool in the colostomy bag NEURO: alert & oriented x 3 with fluent speech, no focal motor/sensory deficits  Port-A-Cath without erythema  LABORATORY DATA:  I have reviewed the data as listed CMP Latest Ref Rng & Units 09/17/2021 09/05/2021 09/05/2021  Glucose 70 - 99  mg/dL 222(H) 216(H) 213(H)  BUN 8 - 23 mg/dL 12 19 16   Creatinine 0.44 - 1.00 mg/dL 1.02(H) 0.80 1.07(H)  Sodium 135 - 145 mmol/L 140 139 139  Potassium 3.5 - 5.1 mmol/L 3.6 5.2(H) 4.9  Chloride 98 - 111 mmol/L 102 105 104  CO2 22 - 32 mmol/L 23 - 22  Calcium 8.9 - 10.3 mg/dL 8.1(L) - 8.5(L)  Total Protein 6.5 - 8.1 g/dL 6.1(L) - 5.8(L)  Total Bilirubin 0.3 - 1.2 mg/dL 0.7 - 0.6  Alkaline Phos 38 - 126 U/L 161(H) - 192(H)  AST 15 - 41 U/L 13(L) - 21  ALT 0 - 44 U/L 7 - 12    Lab Results  Component Value Date   WBC 22.9 (H) 09/23/2021   HGB 7.7 (L) 09/24/2021   HCT 23.6 (L) 09/24/2021   MCV 101.6 (H) 09/23/2021   PLT 105 (L) 09/23/2021   NEUTROABS 21.5 (H) 09/23/2021    CT HEAD WO CONTRAST  Result Date: 09/05/2021 CLINICAL DATA:  Trauma EXAM: CT HEAD WITHOUT CONTRAST CT MAXILLOFACIAL WITHOUT CONTRAST CT CERVICAL SPINE WITHOUT CONTRAST TECHNIQUE: Multidetector CT imaging of the head, cervical spine, and maxillofacial structures were performed using the standard protocol without intravenous contrast. Multiplanar CT image reconstructions of the cervical spine and maxillofacial structures were also generated. COMPARISON:  CT head dated 08/25/2021 FINDINGS: CT HEAD FINDINGS Brain: No evidence of acute infarction, hemorrhage, hydrocephalus, extra-axial collection or mass lesion/mass effect. Subcortical white matter and periventricular small vessel ischemic changes. Vascular: Intracranial atherosclerosis. Skull: Normal.  Negative for fracture or focal lesion. Other: None. CT MAXILLOFACIAL FINDINGS Osseous: No evidence of maxillofacial fracture. Mandible is intact. Bilateral mandibular condyles are well-seated in the TMJs. Orbits: Bilateral orbits, including the globes and retroconal soft tissues, are within normal limits. Sinuses: Partial opacification of the bilateral maxillary sinuses and right sphenoid sinus. Mastoid air cells are clear. Soft tissues: Negative. CT CERVICAL SPINE FINDINGS  Alignment: Normal cervical lordosis. Skull base and vertebrae: No acute fracture. No primary bone lesion or focal pathologic process. Soft tissues and spinal canal: No prevertebral fluid or swelling. No visible canal hematoma. Disc levels: Mild degenerative changes of the mid cervical spine. Spinal canal is patent. Upper chest: Visualized lung apices are clear. Other: Visualized thyroid is unremarkable. IMPRESSION: No evidence of acute intracranial abnormality. Small vessel ischemic changes. No evidence of maxillofacial fracture. No evidence of traumatic injury to the cervical spine. Mild degenerative changes. Electronically Signed   By: Julian Hy M.D.   On: 09/05/2021 19:22   CT CERVICAL SPINE WO CONTRAST  Result Date: 09/05/2021 CLINICAL DATA:  Trauma EXAM: CT HEAD WITHOUT CONTRAST CT MAXILLOFACIAL WITHOUT CONTRAST CT CERVICAL SPINE WITHOUT CONTRAST TECHNIQUE: Multidetector CT imaging of the head, cervical spine, and maxillofacial structures were performed using the standard protocol without intravenous contrast. Multiplanar CT image reconstructions of the cervical spine and maxillofacial structures were also generated. COMPARISON:  CT head dated 08/25/2021 FINDINGS: CT HEAD FINDINGS Brain: No evidence of acute infarction, hemorrhage, hydrocephalus, extra-axial collection or mass lesion/mass effect. Subcortical white matter and periventricular small vessel ischemic changes. Vascular: Intracranial atherosclerosis. Skull: Normal. Negative for fracture or focal lesion. Other: None. CT MAXILLOFACIAL FINDINGS Osseous: No evidence of maxillofacial fracture. Mandible is intact. Bilateral mandibular condyles are well-seated in the TMJs. Orbits: Bilateral orbits, including the globes and retroconal soft tissues, are within normal limits. Sinuses: Partial opacification of the bilateral maxillary sinuses and right sphenoid sinus. Mastoid air cells are clear. Soft tissues: Negative. CT CERVICAL SPINE FINDINGS  Alignment: Normal cervical lordosis. Skull base and vertebrae: No acute fracture. No primary bone lesion or focal pathologic process. Soft tissues and spinal canal: No prevertebral fluid or swelling. No visible canal hematoma. Disc levels: Mild degenerative changes of the mid cervical spine. Spinal canal is patent. Upper chest: Visualized lung apices are clear. Other: Visualized thyroid is unremarkable. IMPRESSION: No evidence of acute intracranial abnormality. Small vessel ischemic changes. No evidence of maxillofacial fracture. No evidence of traumatic injury to the cervical spine. Mild degenerative changes. Electronically Signed   By: Julian Hy M.D.   On: 09/05/2021 19:22   US PELVIS (TRANSABDOMINAL ONLY)  Result Date: 08/27/2021 CLINICAL DATA:  Vaginal bleeding, history of cervical cancer EXAM: TRANSABDOMINAL ULTRASOUND OF PELVIS TECHNIQUE: Transabdominal ultrasound examination of the pelvis was performed including evaluation of the uterus, ovaries, adnexal regions, and pelvic cul-de-sac. COMPARISON:  01/18/2021, 01/23/2021 FINDINGS: Uterus Suboptimally evaluated due to empty bladder and adjacent bowel gas. Endometrium Not visualized. Right ovary Not visualized. Left ovary Not visualized. Other findings: Evaluation is suboptimal as the patient cannot fill her bladder and is incontinent. IMPRESSION: 1. Nondiagnostic pelvic ultrasound, as the pelvic viscera cannot be visualized due to incomplete bladder distension and bowel gas. Electronically Signed   By: Randa Ngo M.D.   On: 08/27/2021 20:23   DG Pelvis Portable  Result Date: 09/05/2021 CLINICAL DATA:  [place], anticoagulated EXAM: PORTABLE PELVIS 1-2 VIEWS COMPARISON:  01/18/2021 FINDINGS: Two frontal views of the pelvis are obtained. Portions of the left iliac crest and proximal left femur are  excluded by collimation. A left hip arthroplasty is partially visualized, with the distal aspect of the femoral component of the prosthesis excluded  by collimation. No change in the appearance of the prosthesis since prior study. No acute displaced fracture. Mild right hip osteoarthritis. IMPRESSION: 1. No acute pelvic fracture. 2. Stable left hip arthroplasty. Electronically Signed   By: Randa Ngo M.D.   On: 09/05/2021 19:12   DG Chest Port 1 View  Result Date: 09/05/2021 CLINICAL DATA:  [place], anticoagulated EXAM: PORTABLE CHEST 1 VIEW COMPARISON:  08/25/2021 FINDINGS: 2 frontal views of the chest demonstrates stable right chest wall port. Cardiac silhouette is stable. No acute airspace disease, effusion, or pneumothorax. No acute bony abnormality. IMPRESSION: 1. No acute intrathoracic process. Electronically Signed   By: Randa Ngo M.D.   On: 09/05/2021 19:10   CT MAXILLOFACIAL WO CONTRAST  Result Date: 09/05/2021 CLINICAL DATA:  Trauma EXAM: CT HEAD WITHOUT CONTRAST CT MAXILLOFACIAL WITHOUT CONTRAST CT CERVICAL SPINE WITHOUT CONTRAST TECHNIQUE: Multidetector CT imaging of the head, cervical spine, and maxillofacial structures were performed using the standard protocol without intravenous contrast. Multiplanar CT image reconstructions of the cervical spine and maxillofacial structures were also generated. COMPARISON:  CT head dated 08/25/2021 FINDINGS: CT HEAD FINDINGS Brain: No evidence of acute infarction, hemorrhage, hydrocephalus, extra-axial collection or mass lesion/mass effect. Subcortical white matter and periventricular small vessel ischemic changes. Vascular: Intracranial atherosclerosis. Skull: Normal. Negative for fracture or focal lesion. Other: None. CT MAXILLOFACIAL FINDINGS Osseous: No evidence of maxillofacial fracture. Mandible is intact. Bilateral mandibular condyles are well-seated in the TMJs. Orbits: Bilateral orbits, including the globes and retroconal soft tissues, are within normal limits. Sinuses: Partial opacification of the bilateral maxillary sinuses and right sphenoid sinus. Mastoid air cells are clear. Soft  tissues: Negative. CT CERVICAL SPINE FINDINGS Alignment: Normal cervical lordosis. Skull base and vertebrae: No acute fracture. No primary bone lesion or focal pathologic process. Soft tissues and spinal canal: No prevertebral fluid or swelling. No visible canal hematoma. Disc levels: Mild degenerative changes of the mid cervical spine. Spinal canal is patent. Upper chest: Visualized lung apices are clear. Other: Visualized thyroid is unremarkable. IMPRESSION: No evidence of acute intracranial abnormality. Small vessel ischemic changes. No evidence of maxillofacial fracture. No evidence of traumatic injury to the cervical spine. Mild degenerative changes. Electronically Signed   By: Julian Hy M.D.   On: 09/05/2021 19:22    ASSESSMENT AND PLAN: Rectal cancer Partially obstructing mass beginning at 10 cm from the anal verge on colonoscopy 01/23/2021, biopsy-adenocarcinoma, immunohistochemical stains at [facility]-CK20, CDX-2, and SATB2 positive, patchy strong staining for p16.  The rectal tumor has a distinct immunohistochemical profile suggesting a primary colorectal tumor as opposed to metastatic cervical cancer. CT abdomen/pelvis 01/18/2021-bilateral hydronephrosis, endometrial fluid collection, prominent stool in the colon MRI pelvis 01/23/2021-tumor at 7.6 cm from the anal verge, abnormal signal bridge in the cervix, mesorectum, and anterior aspect of the rectum-similar to MRI from 2020, MRI stage could not be defined secondary to posttreatment changes in the pelvis 03/18/2021-total abdominal hysterectomy, cystoscopy with insertion of ureteral stents, exploratory laparotomy, aborted low anterior resection, descending loop colostomy--right and left fallopian tubes and ovaries negative for malignancy; excision portion of cervix positive for adenocarcinoma; uterus with invasive moderately differentiated adenocarcinoma of the cervix, HPV associated, invading through full-thickness of the cervix/lower uterine segment  and into parametrial tissue, margins of resection received disrupted, cannot be evaluated; remaining uterus with extensive endocervical adenocarcinoma in situ involving lower uterine segment and replacing endometrium.  This carcinoma was felt to be a separate primary from the rectal tumor. PET scan at [facility] 04/11/2021-intense FDG activity in region of known rectal mass.  Hypermetabolic left external iliac and right inguinal lymph nodes.  Minimal FDG uptake and small right supraclavicular lymph node.  No uptake seen in the cervix. 04/23/2021 FNA right inguinal lymph node-negative for malignancy Evaluated by Dr. Leamon Arnt 05/21/2021, 05/28/2021-appears to have 2 synchronous malignancies (cervical adenocarcinoma and rectal adenocarcinoma); further surgery which would entail pelvic exenteration not recommended by gynecologic oncology or colorectal surgery.  Full doses of radiotherapy not felt to be feasible.  Recommended 3 months of CAPOX and then repeat MRI of abdomen/pelvis, chest CT and colonoscopy. Cycle 1 Xeloda 06/13/2021 Cycle 1 FOLFOX 07/08/2021, oxaliplatin dose reduced secondary to pre-existing neuropathy Cycle 2 FOLFOX 07/29/2021, Udenyca Cycle 3 FOLFOX 08/12/2021, Udenyca Cycle 4 FOLFOX 09/02/2021, Udenyca Cycle 5 FOLFOX 09/17/2021, Udenyca     Bilateral hydronephrosis-secondary to urinary retention? Hematometra found on pelvic MRI 01/23/2021, evaluated by gynecologic oncology Cervical cancer November 1994, stage Ib, treated with external beam radiation and brachytherapy at [facility] Diabetes Neuropathy Chronic pain secondary to #6 Recurrent urinary tract infections History of a CVA History of hyperthyroidism G3, P3 COVID-19 infection January 2021 Admission 08/25/2021 with a urinary tract infection-culture negative Anemia secondary to chronic disease, chemotherapy, and rectal bleeding-1 unit packed red blood cells 08/26/2021 Hospital admission 09/23/2021-symptomatic anemia  [name] was admitted for  symptomatic anemia.  Her anemia may be related to chronic disease and recent chemotherapy.  It is also possible that she may have anemia secondary to GI blood loss.  She denies visible bleeding but does have darkened stools.  She has received 2 units PRBCs so far with improvement of her symptoms.  Hemoglobin this morning is up to 7.7.  Would recommend 1 more unit PRBCs today.  From our standpoint, she is stable for discharge later today when she received 1 unit PRBCs.  Recommendations: 1.  Transfuse 1 unit PRBCs today. 2.  Okay to discharge from our standpoint if otherwise medically stable.  I updated hospitalist with this recommendation.  She already has outpatient follow-up scheduled in our office on 11/28.  Future Appointments  Date Time Provider [place]  10/14/2021  2:40 PM Ladell Pier, MD [facility] None      LOS: 1 day   Mikey Bussing, [place], AGPCNP-BC, AOCNP 09/24/21 Ms. Noh was interviewed and examined.  She reports feeling better after the Red cell transfusion.  She continues to have severe anemia.  We recommend transfusing 1 additional unit of packed red cells prior to discharge. The anemia is likely secondary to chronic disease, chemotherapy, and potentially blood loss from the rectal tumor.  She may also have chronic blood loss from a higher GI source.  The stool was Hemoccult positive last month.  She is scheduled for outpatient follow-up at [facility] next week.  We will arrange for a repeat CBC at the Cancer center next week.  I was present for greater than 50% of today's visit.  I performed medical decision making.  Julieanne Manson, MD

## 2021-09-25 ENCOUNTER — Telehealth: Payer: Self-pay | Admitting: *Deleted

## 2021-09-25 NOTE — Discharge Summary (Signed)
Physician Discharge Summary   Patient name: [name]  Admit date:     09/23/2021  Discharge date: 09/24/2021  Discharge Physician: Dwyane Dee   PCP: Everardo Beals, NP   Recommendations at discharge: Continue following with oncology  Discharge Diagnoses Principal Problem:   Symptomatic anemia Active Problems:   Hypothyroidism   Type 2 diabetes mellitus ([place])   Hyperlipidemia associated with type 2 diabetes mellitus ([place])   Iron deficiency anemia due to chronic blood loss   Resolved Diagnoses Resolved Problems:   * No resolved hospital problems. [facility]    75 year old female admitted with symptomatic anemia, hemoglobin 5.9 g/dL during outpatient lab check.  She was referred to the ER for blood transfusion per oncology. She received total of 3 units PRBC during hospitalization and tolerated well.  Etiology of her anemia is considered multifactorial in setting of chronic disease, chemo use, and rectal tumor especially given ongoing Hemoccult positivity which was repeated prior to admission.    Condition at discharge: stable  Exam Physical Exam Constitutional:      Appearance: Normal appearance.  HENT:     Head: Normocephalic and atraumatic.     Mouth/Throat:     Mouth: Mucous membranes are moist.  Eyes:     Extraocular Movements: Extraocular movements intact.  Cardiovascular:     Rate and Rhythm: Normal rate and regular rhythm.  Pulmonary:     Effort: Pulmonary effort is normal. No respiratory distress.     Breath sounds: Normal breath sounds.  Abdominal:     General: Bowel sounds are normal. There is no distension.     Palpations: Abdomen is soft.     Tenderness: There is no abdominal tenderness.  Musculoskeletal:        General: Normal range of motion.     Cervical back: Normal range of motion and neck supple.  Skin:    General: Skin is warm and dry.  Neurological:     General: No focal deficit present.     Mental Status: She is  alert.  Psychiatric:        Mood and Affect: Mood normal.        Behavior: Behavior normal.     Disposition: Home  Discharge time: greater than 30 minutes.   Allergies as of 09/24/2021       Reactions   Codeine Nausea Only        Medication List     TAKE these medications    acetaminophen 500 MG tablet Commonly known as: TYLENOL Take 500 mg by mouth daily as needed for headache (pain).   aspirin 81 MG EC tablet Take 1 tablet (81 mg total) by mouth daily. RESUME AFTER 1 WEEK What changed:  when to take this additional instructions   atorvastatin 40 MG tablet Commonly known as: LIPITOR Take 40 mg by mouth every morning.   baclofen 10 MG tablet Commonly known as: LIORESAL Take 10 mg by mouth 2 (two) times daily.   cephALEXin 250 MG capsule Commonly known as: KEFLEX Take 1 capsule (250 mg total) by mouth daily. RESUME AFTER YOU HAVE COMPLETED THE COURSE OF CIPRO What changed:  when to take this additional instructions   clopidogrel 75 MG tablet Commonly known as: PLAVIX Take 1 tablet (75 mg total) by mouth daily. RESUME AFTER 1 WEEK ON 09/03/21 What changed:  when to take this additional instructions   docusate sodium 100 MG capsule Commonly known as: COLACE Take 100 mg by mouth 2 (  two) times daily.   ferrous sulfate 325 (65 FE) MG tablet Take 1 tablet (325 mg total) by mouth 2 (two) times daily with a meal.   gabapentin 300 MG capsule Commonly known as: NEURONTIN Take 300-600 mg by mouth See admin instructions. Take one capsule (300 mg) by mouth every morning and two capsules (600 mg) at night   gemfibrozil 600 MG tablet Commonly known as: LOPID Take 600 mg by mouth 2 (two) times daily.   levothyroxine 125 MCG tablet Commonly known as: SYNTHROID Take 1 tablet (125 mcg total) by mouth daily at 6 (six) AM.   lidocaine-prilocaine cream Commonly known as: EMLA Apply to port site 1-2 hours prior to use   metFORMIN 500 MG tablet Commonly known as:  GLUCOPHAGE Take 500 mg by mouth 2 (two) times daily.   nystatin cream Commonly known as: MYCOSTATIN Apply 1 application topically 2 (two) times daily as needed (groin rash). Mix with triamcinolone cream What changed: Another medication with the same name was changed. Make sure you understand how and when to take each.   nystatin powder Commonly known as: MYCOSTATIN/NYSTOP Apply 1 application topically 2 (two) times daily. To groin rash What changed:  when to take this reasons to take this additional instructions   OneTouch Delica Plus [id number] Misc Apply topically 2 (two) times daily.   OneTouch Verio test strip Generic drug: glucose blood 1 each 2 (two) times daily.   polyethylene glycol powder 17 GM/SCOOP powder Commonly known as: GLYCOLAX/MIRALAX Take 17 g by mouth 2 (two) times daily as needed (constipation).   potassium chloride SA 20 MEQ tablet Commonly known as: KLOR-CON Take 1 tablet (20 mEq total) by mouth daily.   senna-docusate 8.6-50 MG tablet Commonly known as: Senokot-S Take 1 tablet by mouth 2 (two) times daily.   Tyler Aas FlexTouch 100 UNIT/ML FlexTouch Pen Generic drug: insulin degludec Inject 10 Units into the skin daily. What changed:  how much to take when to take this reasons to take this   triamcinolone cream 0.1 % Commonly known as: KENALOG Apply 1 application topically 2 (two) times daily as needed (groin rash). Mix with nystatin cream   Xtampza ER 9 MG C12a Generic drug: oxyCODONE ER Take 9 mg by mouth every 12 (twelve) hours.        CT HEAD WO CONTRAST  Result Date: 09/05/2021 CLINICAL DATA:  Trauma EXAM: CT HEAD WITHOUT CONTRAST CT MAXILLOFACIAL WITHOUT CONTRAST CT CERVICAL SPINE WITHOUT CONTRAST TECHNIQUE: Multidetector CT imaging of the head, cervical spine, and maxillofacial structures were performed using the standard protocol without intravenous contrast. Multiplanar CT image reconstructions of the cervical spine and  maxillofacial structures were also generated. COMPARISON:  CT head dated 08/25/2021 FINDINGS: CT HEAD FINDINGS Brain: No evidence of acute infarction, hemorrhage, hydrocephalus, extra-axial collection or mass lesion/mass effect. Subcortical white matter and periventricular small vessel ischemic changes. Vascular: Intracranial atherosclerosis. Skull: Normal. Negative for fracture or focal lesion. Other: None. CT MAXILLOFACIAL FINDINGS Osseous: No evidence of maxillofacial fracture. Mandible is intact. Bilateral mandibular condyles are well-seated in the TMJs. Orbits: Bilateral orbits, including the globes and retroconal soft tissues, are within normal limits. Sinuses: Partial opacification of the bilateral maxillary sinuses and right sphenoid sinus. Mastoid air cells are clear. Soft tissues: Negative. CT CERVICAL SPINE FINDINGS Alignment: Normal cervical lordosis. Skull base and vertebrae: No acute fracture. No primary bone lesion or focal pathologic process. Soft tissues and spinal canal: No prevertebral fluid or swelling. No visible canal hematoma. Disc levels: Mild degenerative changes  of the mid cervical spine. Spinal canal is patent. Upper chest: Visualized lung apices are clear. Other: Visualized thyroid is unremarkable. IMPRESSION: No evidence of acute intracranial abnormality. Small vessel ischemic changes. No evidence of maxillofacial fracture. No evidence of traumatic injury to the cervical spine. Mild degenerative changes. Electronically Signed   By: Julian Hy M.D.   On: 09/05/2021 19:22   CT CERVICAL SPINE WO CONTRAST  Result Date: 09/05/2021 CLINICAL DATA:  Trauma EXAM: CT HEAD WITHOUT CONTRAST CT MAXILLOFACIAL WITHOUT CONTRAST CT CERVICAL SPINE WITHOUT CONTRAST TECHNIQUE: Multidetector CT imaging of the head, cervical spine, and maxillofacial structures were performed using the standard protocol without intravenous contrast. Multiplanar CT image reconstructions of the cervical spine and  maxillofacial structures were also generated. COMPARISON:  CT head dated 08/25/2021 FINDINGS: CT HEAD FINDINGS Brain: No evidence of acute infarction, hemorrhage, hydrocephalus, extra-axial collection or mass lesion/mass effect. Subcortical white matter and periventricular small vessel ischemic changes. Vascular: Intracranial atherosclerosis. Skull: Normal. Negative for fracture or focal lesion. Other: None. CT MAXILLOFACIAL FINDINGS Osseous: No evidence of maxillofacial fracture. Mandible is intact. Bilateral mandibular condyles are well-seated in the TMJs. Orbits: Bilateral orbits, including the globes and retroconal soft tissues, are within normal limits. Sinuses: Partial opacification of the bilateral maxillary sinuses and right sphenoid sinus. Mastoid air cells are clear. Soft tissues: Negative. CT CERVICAL SPINE FINDINGS Alignment: Normal cervical lordosis. Skull base and vertebrae: No acute fracture. No primary bone lesion or focal pathologic process. Soft tissues and spinal canal: No prevertebral fluid or swelling. No visible canal hematoma. Disc levels: Mild degenerative changes of the mid cervical spine. Spinal canal is patent. Upper chest: Visualized lung apices are clear. Other: Visualized thyroid is unremarkable. IMPRESSION: No evidence of acute intracranial abnormality. Small vessel ischemic changes. No evidence of maxillofacial fracture. No evidence of traumatic injury to the cervical spine. Mild degenerative changes. Electronically Signed   By: Julian Hy M.D.   On: 09/05/2021 19:22   US PELVIS (TRANSABDOMINAL ONLY)  Result Date: 08/27/2021 CLINICAL DATA:  Vaginal bleeding, history of cervical cancer EXAM: TRANSABDOMINAL ULTRASOUND OF PELVIS TECHNIQUE: Transabdominal ultrasound examination of the pelvis was performed including evaluation of the uterus, ovaries, adnexal regions, and pelvic cul-de-sac. COMPARISON:  01/18/2021, 01/23/2021 FINDINGS: Uterus Suboptimally evaluated due to empty  bladder and adjacent bowel gas. Endometrium Not visualized. Right ovary Not visualized. Left ovary Not visualized. Other findings: Evaluation is suboptimal as the patient cannot fill her bladder and is incontinent. IMPRESSION: 1. Nondiagnostic pelvic ultrasound, as the pelvic viscera cannot be visualized due to incomplete bladder distension and bowel gas. Electronically Signed   By: Randa Ngo M.D.   On: 08/27/2021 20:23   DG Pelvis Portable  Result Date: 09/05/2021 CLINICAL DATA:  [place], anticoagulated EXAM: PORTABLE PELVIS 1-2 VIEWS COMPARISON:  01/18/2021 FINDINGS: Two frontal views of the pelvis are obtained. Portions of the left iliac crest and proximal left femur are excluded by collimation. A left hip arthroplasty is partially visualized, with the distal aspect of the femoral component of the prosthesis excluded by collimation. No change in the appearance of the prosthesis since prior study. No acute displaced fracture. Mild right hip osteoarthritis. IMPRESSION: 1. No acute pelvic fracture. 2. Stable left hip arthroplasty. Electronically Signed   By: Randa Ngo M.D.   On: 09/05/2021 19:12   DG Chest Port 1 View  Result Date: 09/05/2021 CLINICAL DATA:  [place], anticoagulated EXAM: PORTABLE CHEST 1 VIEW COMPARISON:  08/25/2021 FINDINGS: 2 frontal views of the chest demonstrates stable right chest wall  port. Cardiac silhouette is stable. No acute airspace disease, effusion, or pneumothorax. No acute bony abnormality. IMPRESSION: 1. No acute intrathoracic process. Electronically Signed   By: Randa Ngo M.D.   On: 09/05/2021 19:10   CT MAXILLOFACIAL WO CONTRAST  Result Date: 09/05/2021 CLINICAL DATA:  Trauma EXAM: CT HEAD WITHOUT CONTRAST CT MAXILLOFACIAL WITHOUT CONTRAST CT CERVICAL SPINE WITHOUT CONTRAST TECHNIQUE: Multidetector CT imaging of the head, cervical spine, and maxillofacial structures were performed using the standard protocol without intravenous contrast. Multiplanar CT image  reconstructions of the cervical spine and maxillofacial structures were also generated. COMPARISON:  CT head dated 08/25/2021 FINDINGS: CT HEAD FINDINGS Brain: No evidence of acute infarction, hemorrhage, hydrocephalus, extra-axial collection or mass lesion/mass effect. Subcortical white matter and periventricular small vessel ischemic changes. Vascular: Intracranial atherosclerosis. Skull: Normal. Negative for fracture or focal lesion. Other: None. CT MAXILLOFACIAL FINDINGS Osseous: No evidence of maxillofacial fracture. Mandible is intact. Bilateral mandibular condyles are well-seated in the TMJs. Orbits: Bilateral orbits, including the globes and retroconal soft tissues, are within normal limits. Sinuses: Partial opacification of the bilateral maxillary sinuses and right sphenoid sinus. Mastoid air cells are clear. Soft tissues: Negative. CT CERVICAL SPINE FINDINGS Alignment: Normal cervical lordosis. Skull base and vertebrae: No acute fracture. No primary bone lesion or focal pathologic process. Soft tissues and spinal canal: No prevertebral fluid or swelling. No visible canal hematoma. Disc levels: Mild degenerative changes of the mid cervical spine. Spinal canal is patent. Upper chest: Visualized lung apices are clear. Other: Visualized thyroid is unremarkable. IMPRESSION: No evidence of acute intracranial abnormality. Small vessel ischemic changes. No evidence of maxillofacial fracture. No evidence of traumatic injury to the cervical spine. Mild degenerative changes. Electronically Signed   By: Julian Hy M.D.   On: 09/05/2021 19:22   Results for orders placed or performed during the hospital encounter of 09/05/21  Resp Panel by RT-PCR (Flu A&B, Covid) Nasopharyngeal Swab     Status: None   Collection Time: 09/05/21  7:10 PM   Specimen: Nasopharyngeal Swab; Nasopharyngeal(NP) swabs in vial transport medium  Result Value Ref Range Status   SARS Coronavirus 2 by RT PCR NEGATIVE NEGATIVE Final     Comment: (NOTE) SARS-CoV-2 target nucleic acids are NOT DETECTED.  The SARS-CoV-2 RNA is generally detectable in upper respiratory specimens during the acute phase of infection. The lowest concentration of SARS-CoV-2 viral copies this assay can detect is 138 copies/mL. A negative result does not preclude SARS-Cov-2 infection and should not be used as the sole basis for treatment or other patient management decisions. A negative result may occur with  improper specimen collection/handling, submission of specimen other than nasopharyngeal swab, presence of viral mutation(s) within the areas targeted by this assay, and inadequate number of viral copies(<138 copies/mL). A negative result must be combined with clinical observations, patient history, and epidemiological information. The expected result is Negative.  Fact Sheet for Patients:  EntrepreneurPulse.com.au  Fact Sheet for Healthcare Providers:  IncredibleEmployment.be  This test is no t yet approved or cleared by the [place] FDA and  has been authorized for detection and/or diagnosis of SARS-CoV-2 by FDA under an Emergency Use Authorization (EUA). This EUA will remain  in effect (meaning this test can be used) for the duration of the COVID-19 declaration under Section 564(b)(1) of the Act, 21 U.S.C.section 360bbb-3(b)(1), unless the authorization is terminated  or revoked sooner.       Influenza A by PCR NEGATIVE NEGATIVE Final   Influenza B by PCR NEGATIVE  NEGATIVE Final    Comment: (NOTE) The Xpert Xpress SARS-CoV-2/FLU/RSV plus assay is intended as an aid in the diagnosis of influenza from Nasopharyngeal swab specimens and should not be used as a sole basis for treatment. Nasal washings and aspirates are unacceptable for Xpert Xpress SARS-CoV-2/FLU/RSV testing.  Fact Sheet for Patients: EntrepreneurPulse.com.au  Fact Sheet for Healthcare  Providers: IncredibleEmployment.be  This test is not yet approved or cleared by the [place] FDA and has been authorized for detection and/or diagnosis of SARS-CoV-2 by FDA under an Emergency Use Authorization (EUA). This EUA will remain in effect (meaning this test can be used) for the duration of the COVID-19 declaration under Section 564(b)(1) of the Act, 21 U.S.C. section 360bbb-3(b)(1), unless the authorization is terminated or revoked.  Performed at [facility], [place] [address] [place], [place] [address]     Signed:  Dwyane Dee MD.  [facility] 09/25/2021, 5:41 PM

## 2021-09-25 NOTE — Telephone Encounter (Signed)
Opened in error

## 2021-09-26 ENCOUNTER — Telehealth: Payer: Self-pay | Admitting: *Deleted

## 2021-09-26 NOTE — Telephone Encounter (Signed)
[name] daughter was contacted by telephone to verify understanding of discharge instructions status post their most recent discharge from the hospital on the date:  09/24/21.  Inpatient discharge AVS was re-reviewed with daughter, along with cancer center appointments.  Verification of understanding for oncology specific follow-up was validated using the Teach Back method.    Transportation to appointments were confirmed for the patient as being self/caregiver. Daughters questions were addressed to her satisfaction upon completion of this post discharge follow-up call for outpatient oncology.

## 2021-09-26 NOTE — Telephone Encounter (Signed)
Called and spoke w/daughter [name] to f/u on status post discharge. Daughter reports she is still weak, not eating much due to nausea.  Suggested she encourage her to take antiemetic prior to meals and call for vomiting or if she is not able to drink fluids. Reviewed meds and informed her not to take Plavix until 10/04/21. Are aware of appointments and she has transportation.

## 2021-09-27 LAB — TYPE AND SCREEN
ABO/RH(D): O POS
Antibody Screen: NEGATIVE
Unit division: 0
Unit division: 0
Unit division: 0
Unit division: 0

## 2021-09-27 LAB — BPAM RBC
Blood Product Expiration Date: 202211232359
Blood Product Expiration Date: 202212062359
Blood Product Expiration Date: 202212062359
Blood Product Expiration Date: 202212102359
ISSUE DATE / TIME: 202211071706
ISSUE DATE / TIME: 202211072148
ISSUE DATE / TIME: 202211081028
Unit Type and Rh: 5100
Unit Type and Rh: 5100
Unit Type and Rh: 5100
Unit Type and Rh: 5100

## 2021-10-01 ENCOUNTER — Other Ambulatory Visit: Payer: Self-pay | Admitting: *Deleted

## 2021-10-01 DIAGNOSIS — Z9049 Acquired absence of other specified parts of digestive tract: Secondary | ICD-10-CM | POA: Diagnosis not present

## 2021-10-01 DIAGNOSIS — Z79899 Other long term (current) drug therapy: Secondary | ICD-10-CM | POA: Diagnosis not present

## 2021-10-01 DIAGNOSIS — C20 Malignant neoplasm of rectum: Secondary | ICD-10-CM

## 2021-10-01 DIAGNOSIS — Z8541 Personal history of malignant neoplasm of cervix uteri: Secondary | ICD-10-CM | POA: Diagnosis not present

## 2021-10-01 DIAGNOSIS — R918 Other nonspecific abnormal finding of lung field: Secondary | ICD-10-CM | POA: Diagnosis not present

## 2021-10-01 DIAGNOSIS — K2980 Duodenitis without bleeding: Secondary | ICD-10-CM | POA: Diagnosis not present

## 2021-10-01 DIAGNOSIS — Z87891 Personal history of nicotine dependence: Secondary | ICD-10-CM | POA: Diagnosis not present

## 2021-10-01 NOTE — Progress Notes (Signed)
Patient rescheduled to Thursday for lab work. Daughter aware.

## 2021-10-02 ENCOUNTER — Inpatient Hospital Stay: Payer: Medicare HMO

## 2021-10-03 ENCOUNTER — Telehealth: Payer: Self-pay

## 2021-10-03 ENCOUNTER — Other Ambulatory Visit: Payer: Self-pay

## 2021-10-03 ENCOUNTER — Telehealth: Payer: Self-pay | Admitting: *Deleted

## 2021-10-03 ENCOUNTER — Other Ambulatory Visit (HOSPITAL_BASED_OUTPATIENT_CLINIC_OR_DEPARTMENT_OTHER): Payer: Self-pay

## 2021-10-03 ENCOUNTER — Inpatient Hospital Stay: Payer: Medicare HMO

## 2021-10-03 DIAGNOSIS — D649 Anemia, unspecified: Secondary | ICD-10-CM

## 2021-10-03 DIAGNOSIS — N136 Pyonephrosis: Secondary | ICD-10-CM | POA: Diagnosis not present

## 2021-10-03 DIAGNOSIS — C20 Malignant neoplasm of rectum: Secondary | ICD-10-CM

## 2021-10-03 DIAGNOSIS — C539 Malignant neoplasm of cervix uteri, unspecified: Secondary | ICD-10-CM | POA: Diagnosis not present

## 2021-10-03 DIAGNOSIS — Z5111 Encounter for antineoplastic chemotherapy: Secondary | ICD-10-CM | POA: Diagnosis not present

## 2021-10-03 DIAGNOSIS — D6481 Anemia due to antineoplastic chemotherapy: Secondary | ICD-10-CM | POA: Diagnosis not present

## 2021-10-03 LAB — CBC WITH DIFFERENTIAL (CANCER CENTER ONLY)
Abs Immature Granulocytes: 0.41 10*3/uL — ABNORMAL HIGH (ref 0.00–0.07)
Basophils Absolute: 0 10*3/uL (ref 0.0–0.1)
Basophils Relative: 0 %
Eosinophils Absolute: 0 10*3/uL (ref 0.0–0.5)
Eosinophils Relative: 0 %
HCT: 29 % — ABNORMAL LOW (ref 36.0–46.0)
Hemoglobin: 9.9 g/dL — ABNORMAL LOW (ref 12.0–15.0)
Immature Granulocytes: 3 %
Lymphocytes Relative: 8 %
Lymphs Abs: 1 10*3/uL (ref 0.7–4.0)
MCH: 28.9 pg (ref 26.0–34.0)
MCHC: 34.1 g/dL (ref 30.0–36.0)
MCV: 84.5 fL (ref 80.0–100.0)
Monocytes Absolute: 1 10*3/uL (ref 0.1–1.0)
Monocytes Relative: 8 %
Neutro Abs: 9.9 10*3/uL — ABNORMAL HIGH (ref 1.7–7.7)
Neutrophils Relative %: 81 %
Platelet Count: 158 10*3/uL (ref 150–400)
RBC: 3.43 MIL/uL — ABNORMAL LOW (ref 3.87–5.11)
RDW: 17 % — ABNORMAL HIGH (ref 11.5–15.5)
WBC Count: 12.4 10*3/uL — ABNORMAL HIGH (ref 4.0–10.5)
nRBC: 0.2 % (ref 0.0–0.2)

## 2021-10-03 LAB — BASIC METABOLIC PANEL - CANCER CENTER ONLY
Anion gap: 12 (ref 5–15)
BUN: 14 mg/dL (ref 8–23)
CO2: 25 mmol/L (ref 22–32)
Calcium: 8.6 mg/dL — ABNORMAL LOW (ref 8.9–10.3)
Chloride: 95 mmol/L — ABNORMAL LOW (ref 98–111)
Creatinine: 0.9 mg/dL (ref 0.44–1.00)
GFR, Estimated: >60 mL/min (ref >60)
Glucose, Bld: 205 mg/dL — ABNORMAL HIGH (ref 70–99)
Potassium: 2.6 mmol/L — CL (ref 3.5–5.1)
Sodium: 132 mmol/L — ABNORMAL LOW (ref 135–145)

## 2021-10-03 LAB — SAMPLE TO BLOOD BANK

## 2021-10-03 LAB — MAGNESIUM: Magnesium: 0.7 mg/dL — CL (ref 1.7–2.4)

## 2021-10-03 MED ORDER — POTASSIUM CHLORIDE CRYS ER 20 MEQ PO TBCR: 20.0000 meq | EXTENDED_RELEASE_TABLET | Freq: Two times a day (BID) | ORAL | 1 refills | Status: DC

## 2021-10-03 NOTE — Telephone Encounter (Signed)
Spoke with Pt's daughter informed her that Dr Benay Spice would like the Pt to take 64mq of potassium three times a day for one day then 2 times daily. Pt's daughter stated Pt will start tomorrow and needed a prescription sent in  to the pharmacy. Pt's daughter verbalized understanding. Prescription sent to the pharmacy.

## 2021-10-03 NOTE — Telephone Encounter (Signed)
CRITICAL VALUE STICKER  CRITICAL VALUE: K+ 2.6    RECEIVER (on-site recipient of call):Allon Costlow,RN  DATE & TIME NOTIFIED: 10/03/21 @ 12:02  MESSENGER (representative from lab):Otila Kluver  MD NOTIFIED: Dr. Benay Spice  TIME OF NOTIFICATION: 1210  RESPONSE:   Spoke w/daughter and confirmed she is consistently taking 20 meq daily. Was 2.5 at [facility] on 10/01/21.  Informed her that Hgb is stable at 9.9

## 2021-10-03 NOTE — Telephone Encounter (Signed)
CRITICAL VALUE STICKER  CRITICAL VALUE: Magnesium 0.7  RECEIVER (on-site recipient of call): Lenox Ponds LPN  DATE & TIME NOTIFIED: 10/03/21 4:23  MESSENGER (representative from lab): Sunni  [facility]  MD NOTIFIED: Dr Benay Spice   TIME OF NOTIFICATION: 1625  RESPONSE: Pt scheduled to come in for IV mag 10/04/21.

## 2021-10-03 NOTE — Telephone Encounter (Signed)
TC to Pt's daughter to inform her that Dr Benay Spice added a magnesium level to her blood work which came back low (0.7 )Dr Benay Spice wants her to come in tomorrow for IV magnesium. Pt's daughter stated she can come in tomorrow. Appointment scheduled in infusion.

## 2021-10-04 ENCOUNTER — Encounter: Payer: Self-pay | Admitting: Oncology

## 2021-10-04 ENCOUNTER — Inpatient Hospital Stay: Payer: Medicare HMO

## 2021-10-04 VITALS — BP 107/53 | HR 62 | Temp 97.7°F | Resp 18 | Wt 129.0 lb

## 2021-10-04 DIAGNOSIS — Z5111 Encounter for antineoplastic chemotherapy: Secondary | ICD-10-CM | POA: Diagnosis not present

## 2021-10-04 DIAGNOSIS — N136 Pyonephrosis: Secondary | ICD-10-CM | POA: Diagnosis not present

## 2021-10-04 DIAGNOSIS — C20 Malignant neoplasm of rectum: Secondary | ICD-10-CM | POA: Diagnosis not present

## 2021-10-04 DIAGNOSIS — D6481 Anemia due to antineoplastic chemotherapy: Secondary | ICD-10-CM | POA: Diagnosis not present

## 2021-10-04 DIAGNOSIS — Z95828 Presence of other vascular implants and grafts: Secondary | ICD-10-CM

## 2021-10-04 DIAGNOSIS — C539 Malignant neoplasm of cervix uteri, unspecified: Secondary | ICD-10-CM | POA: Diagnosis not present

## 2021-10-04 MED ORDER — SODIUM CHLORIDE 0.9 % IV SOLN: INTRAVENOUS | Status: DC

## 2021-10-04 MED ORDER — MAGNESIUM SULFATE 4 GM/100ML IV SOLN
4.0000 g | Freq: Once | INTRAVENOUS | Status: AC
  Administered 2021-10-04: 4 g via INTRAVENOUS
  Filled 2021-10-04: qty 100

## 2021-10-04 MED ORDER — SODIUM CHLORIDE 0.9% FLUSH
10.0000 mL | Freq: Once | INTRAVENOUS | Status: AC
  Administered 2021-10-04: 10 mL via INTRAVENOUS

## 2021-10-04 MED ORDER — HEPARIN SOD (PORK) LOCK FLUSH 100 UNIT/ML IV SOLN
500.0000 [IU] | Freq: Once | INTRAVENOUS | Status: AC
  Administered 2021-10-04: 500 [IU] via INTRAVENOUS

## 2021-10-04 NOTE — Patient Instructions (Signed)
Magnesium Sulfate Injection What is this medication? MAGNESIUM SULFATE (mag NEE zee um SUL fate) prevents and treats low levels of magnesium in your body. It may also be used to prevent and treat seizures during pregnancy in people with high blood pressure disorders, such as preeclampsia or eclampsia. Magnesium plays an important role in maintaining the health of your muscles and nervous system. This medicine may be used for other purposes; ask your health care provider or pharmacist if you have questions. What should I tell my care team before I take this medication? They need to know if you have any of these conditions: Heart disease History of irregular heart beat Kidney disease An unusual or allergic reaction to magnesium sulfate, medications, foods, dyes, or preservatives Pregnant or trying to get pregnant Breast-feeding How should I use this medication? This medication is for infusion into a vein. It is given in a hospital or clinic setting. Talk to your care team about the use of this medication in children. While this medication may be prescribed for selected conditions, precautions do apply. Overdosage: If you think you have taken too much of this medicine contact a poison control center or emergency room at once. NOTE: This medicine is only for you. Do not share this medicine with others. What if I miss a dose? This does not apply. What may interact with this medication? Certain medications for anxiety or sleep Certain medications for seizures like phenobarbital Digoxin Medications that relax muscles for surgery Narcotic medications for pain This list may not describe all possible interactions. Give your health care provider a list of all the medicines, herbs, non-prescription drugs, or dietary supplements you use. Also tell them if you smoke, drink alcohol, or use illegal drugs. Some items may interact with your medicine. What should I watch for while using this medication? Your  condition will be monitored carefully while you are receiving this medication. You may need blood work done while you are receiving this medication. What side effects may I notice from receiving this medication? Side effects that you should report to your care team as soon as possible: Allergic reactions--skin rash, itching, hives, swelling of the face, lips, tongue, or throat High magnesium level--confusion, drowsiness, facial flushing, redness, sweating, muscle weakness, fast or irregular heartbeat, trouble breathing Low blood pressure--dizziness, feeling faint or lightheaded, blurry vision Side effects that usually do not require medical attention (report to your care team if they continue or are bothersome): Headache Nausea This list may not describe all possible side effects. Call your doctor for medical advice about side effects. You may report side effects to FDA at 1-800-FDA-1088. Where should I keep my medication? This medication is given in a hospital or clinic and will not be stored at home. NOTE: This sheet is a summary. It may not cover all possible information. If you have questions about this medicine, talk to your doctor, pharmacist, or health care provider.  2022 Elsevier/Gold Standard (2021-01-17 00:00:00)

## 2021-10-07 ENCOUNTER — Other Ambulatory Visit: Payer: Self-pay | Admitting: Nurse Practitioner

## 2021-10-07 DIAGNOSIS — C20 Malignant neoplasm of rectum: Secondary | ICD-10-CM

## 2021-10-08 ENCOUNTER — Other Ambulatory Visit: Payer: Self-pay | Admitting: *Deleted

## 2021-10-08 ENCOUNTER — Inpatient Hospital Stay: Payer: Medicare HMO

## 2021-10-08 ENCOUNTER — Ambulatory Visit (INDEPENDENT_AMBULATORY_CARE_PROVIDER_SITE_OTHER): Payer: Medicare HMO

## 2021-10-08 ENCOUNTER — Other Ambulatory Visit: Payer: Self-pay

## 2021-10-08 ENCOUNTER — Telehealth: Payer: Self-pay | Admitting: *Deleted

## 2021-10-08 ENCOUNTER — Inpatient Hospital Stay (HOSPITAL_COMMUNITY)
Admission: AD | Admit: 2021-10-08 | Discharge: 2021-10-12 | DRG: 871 | Disposition: A | Discharge status: Home / Self Care | Payer: Medicare HMO | Source: Ambulatory Visit | Attending: Family Medicine | Admitting: Family Medicine

## 2021-10-08 ENCOUNTER — Inpatient Hospital Stay: Payer: Medicare HMO | Admitting: Nurse Practitioner

## 2021-10-08 ENCOUNTER — Encounter: Payer: Self-pay | Admitting: Podiatry

## 2021-10-08 ENCOUNTER — Ambulatory Visit (INDEPENDENT_AMBULATORY_CARE_PROVIDER_SITE_OTHER): Payer: Medicare HMO | Admitting: Podiatry

## 2021-10-08 ENCOUNTER — Inpatient Hospital Stay (HOSPITAL_COMMUNITY): Payer: Medicare HMO

## 2021-10-08 ENCOUNTER — Other Ambulatory Visit: Payer: Self-pay | Admitting: Podiatry

## 2021-10-08 ENCOUNTER — Encounter (HOSPITAL_COMMUNITY): Payer: Self-pay | Admitting: Internal Medicine

## 2021-10-08 ENCOUNTER — Encounter: Payer: Self-pay | Admitting: *Deleted

## 2021-10-08 ENCOUNTER — Encounter: Payer: Self-pay | Admitting: Nurse Practitioner

## 2021-10-08 VITALS — Temp 97.6°F

## 2021-10-08 VITALS — BP 124/73 | HR 93 | Temp 97.5°F | Resp 16 | Ht 60.0 in

## 2021-10-08 DIAGNOSIS — C20 Malignant neoplasm of rectum: Secondary | ICD-10-CM

## 2021-10-08 DIAGNOSIS — L97412 Non-pressure chronic ulcer of right heel and midfoot with fat layer exposed: Secondary | ICD-10-CM

## 2021-10-08 DIAGNOSIS — Z87440 Personal history of urinary (tract) infections: Secondary | ICD-10-CM

## 2021-10-08 DIAGNOSIS — A419 Sepsis, unspecified organism: Principal | ICD-10-CM

## 2021-10-08 DIAGNOSIS — E119 Type 2 diabetes mellitus without complications: Secondary | ICD-10-CM

## 2021-10-08 DIAGNOSIS — R6520 Severe sepsis without septic shock: Secondary | ICD-10-CM | POA: Diagnosis not present

## 2021-10-08 DIAGNOSIS — M79674 Pain in right toe(s): Secondary | ICD-10-CM

## 2021-10-08 DIAGNOSIS — E440 Moderate protein-calorie malnutrition: Secondary | ICD-10-CM | POA: Diagnosis not present

## 2021-10-08 DIAGNOSIS — E1142 Type 2 diabetes mellitus with diabetic polyneuropathy: Secondary | ICD-10-CM

## 2021-10-08 DIAGNOSIS — Z7902 Long term (current) use of antithrombotics/antiplatelets: Secondary | ICD-10-CM

## 2021-10-08 DIAGNOSIS — Z79899 Other long term (current) drug therapy: Secondary | ICD-10-CM

## 2021-10-08 DIAGNOSIS — Z833 Family history of diabetes mellitus: Secondary | ICD-10-CM

## 2021-10-08 DIAGNOSIS — C779 Secondary and unspecified malignant neoplasm of lymph node, unspecified: Secondary | ICD-10-CM | POA: Diagnosis not present

## 2021-10-08 DIAGNOSIS — E876 Hypokalemia: Secondary | ICD-10-CM | POA: Diagnosis not present

## 2021-10-08 DIAGNOSIS — K269 Duodenal ulcer, unspecified as acute or chronic, without hemorrhage or perforation: Secondary | ICD-10-CM | POA: Diagnosis not present

## 2021-10-08 DIAGNOSIS — E872 Acidosis, unspecified: Secondary | ICD-10-CM | POA: Diagnosis present

## 2021-10-08 DIAGNOSIS — Z7982 Long term (current) use of aspirin: Secondary | ICD-10-CM

## 2021-10-08 DIAGNOSIS — N179 Acute kidney failure, unspecified: Secondary | ICD-10-CM | POA: Diagnosis present

## 2021-10-08 DIAGNOSIS — A4152 Sepsis due to Pseudomonas: Principal | ICD-10-CM | POA: Diagnosis present

## 2021-10-08 DIAGNOSIS — K250 Acute gastric ulcer with hemorrhage: Secondary | ICD-10-CM | POA: Diagnosis present

## 2021-10-08 DIAGNOSIS — K6289 Other specified diseases of anus and rectum: Secondary | ICD-10-CM | POA: Diagnosis not present

## 2021-10-08 DIAGNOSIS — Z8673 Personal history of transient ischemic attack (TIA), and cerebral infarction without residual deficits: Secondary | ICD-10-CM

## 2021-10-08 DIAGNOSIS — M79675 Pain in left toe(s): Secondary | ICD-10-CM | POA: Diagnosis not present

## 2021-10-08 DIAGNOSIS — D638 Anemia in other chronic diseases classified elsewhere: Secondary | ICD-10-CM | POA: Diagnosis present

## 2021-10-08 DIAGNOSIS — K264 Chronic or unspecified duodenal ulcer with hemorrhage: Secondary | ICD-10-CM | POA: Diagnosis present

## 2021-10-08 DIAGNOSIS — R935 Abnormal findings on diagnostic imaging of other abdominal regions, including retroperitoneum: Secondary | ICD-10-CM

## 2021-10-08 DIAGNOSIS — B351 Tinea unguium: Secondary | ICD-10-CM

## 2021-10-08 DIAGNOSIS — K2100 Gastro-esophageal reflux disease with esophagitis, without bleeding: Secondary | ICD-10-CM | POA: Diagnosis not present

## 2021-10-08 DIAGNOSIS — D6481 Anemia due to antineoplastic chemotherapy: Secondary | ICD-10-CM | POA: Diagnosis present

## 2021-10-08 DIAGNOSIS — Z8616 Personal history of COVID-19: Secondary | ICD-10-CM

## 2021-10-08 DIAGNOSIS — N390 Urinary tract infection, site not specified: Secondary | ICD-10-CM | POA: Diagnosis present

## 2021-10-08 DIAGNOSIS — E1140 Type 2 diabetes mellitus with diabetic neuropathy, unspecified: Secondary | ICD-10-CM | POA: Diagnosis present

## 2021-10-08 DIAGNOSIS — M2041 Other hammer toe(s) (acquired), right foot: Secondary | ICD-10-CM

## 2021-10-08 DIAGNOSIS — L97519 Non-pressure chronic ulcer of other part of right foot with unspecified severity: Secondary | ICD-10-CM

## 2021-10-08 DIAGNOSIS — E11621 Type 2 diabetes mellitus with foot ulcer: Secondary | ICD-10-CM

## 2021-10-08 DIAGNOSIS — D649 Anemia, unspecified: Secondary | ICD-10-CM

## 2021-10-08 DIAGNOSIS — L84 Corns and callosities: Secondary | ICD-10-CM | POA: Diagnosis not present

## 2021-10-08 DIAGNOSIS — Z825 Family history of asthma and other chronic lower respiratory diseases: Secondary | ICD-10-CM

## 2021-10-08 DIAGNOSIS — Z8542 Personal history of malignant neoplasm of other parts of uterus: Secondary | ICD-10-CM

## 2021-10-08 DIAGNOSIS — Z933 Colostomy status: Secondary | ICD-10-CM

## 2021-10-08 DIAGNOSIS — D630 Anemia in neoplastic disease: Secondary | ICD-10-CM | POA: Diagnosis present

## 2021-10-08 DIAGNOSIS — E039 Hypothyroidism, unspecified: Secondary | ICD-10-CM | POA: Diagnosis present

## 2021-10-08 DIAGNOSIS — E08621 Diabetes mellitus due to underlying condition with foot ulcer: Secondary | ICD-10-CM

## 2021-10-08 DIAGNOSIS — M2042 Other hammer toe(s) (acquired), left foot: Secondary | ICD-10-CM

## 2021-10-08 DIAGNOSIS — D500 Iron deficiency anemia secondary to blood loss (chronic): Secondary | ICD-10-CM | POA: Diagnosis present

## 2021-10-08 DIAGNOSIS — E890 Postprocedural hypothyroidism: Secondary | ICD-10-CM | POA: Diagnosis present

## 2021-10-08 DIAGNOSIS — E860 Dehydration: Secondary | ICD-10-CM | POA: Diagnosis present

## 2021-10-08 DIAGNOSIS — Z79890 Hormone replacement therapy (postmenopausal): Secondary | ICD-10-CM

## 2021-10-08 DIAGNOSIS — Z885 Allergy status to narcotic agent status: Secondary | ICD-10-CM

## 2021-10-08 DIAGNOSIS — R112 Nausea with vomiting, unspecified: Secondary | ICD-10-CM | POA: Diagnosis not present

## 2021-10-08 DIAGNOSIS — Z8541 Personal history of malignant neoplasm of cervix uteri: Secondary | ICD-10-CM

## 2021-10-08 DIAGNOSIS — K2980 Duodenitis without bleeding: Secondary | ICD-10-CM | POA: Diagnosis present

## 2021-10-08 DIAGNOSIS — K259 Gastric ulcer, unspecified as acute or chronic, without hemorrhage or perforation: Secondary | ICD-10-CM | POA: Diagnosis not present

## 2021-10-08 DIAGNOSIS — Z87891 Personal history of nicotine dependence: Secondary | ICD-10-CM

## 2021-10-08 DIAGNOSIS — Z794 Long term (current) use of insulin: Secondary | ICD-10-CM

## 2021-10-08 DIAGNOSIS — E785 Hyperlipidemia, unspecified: Secondary | ICD-10-CM | POA: Diagnosis present

## 2021-10-08 DIAGNOSIS — R0602 Shortness of breath: Secondary | ICD-10-CM | POA: Diagnosis not present

## 2021-10-08 DIAGNOSIS — Z7984 Long term (current) use of oral hypoglycemic drugs: Secondary | ICD-10-CM

## 2021-10-08 DIAGNOSIS — G8929 Other chronic pain: Secondary | ICD-10-CM | POA: Diagnosis present

## 2021-10-08 DIAGNOSIS — E1169 Type 2 diabetes mellitus with other specified complication: Secondary | ICD-10-CM | POA: Diagnosis present

## 2021-10-08 DIAGNOSIS — C539 Malignant neoplasm of cervix uteri, unspecified: Secondary | ICD-10-CM | POA: Diagnosis present

## 2021-10-08 DIAGNOSIS — Z808 Family history of malignant neoplasm of other organs or systems: Secondary | ICD-10-CM

## 2021-10-08 DIAGNOSIS — L97529 Non-pressure chronic ulcer of other part of left foot with unspecified severity: Secondary | ICD-10-CM | POA: Diagnosis present

## 2021-10-08 DIAGNOSIS — R911 Solitary pulmonary nodule: Secondary | ICD-10-CM | POA: Diagnosis not present

## 2021-10-08 DIAGNOSIS — D539 Nutritional anemia, unspecified: Secondary | ICD-10-CM | POA: Diagnosis present

## 2021-10-08 DIAGNOSIS — M1990 Unspecified osteoarthritis, unspecified site: Secondary | ICD-10-CM | POA: Diagnosis present

## 2021-10-08 DIAGNOSIS — Z96642 Presence of left artificial hip joint: Secondary | ICD-10-CM | POA: Diagnosis present

## 2021-10-08 LAB — CMP (CANCER CENTER ONLY)
ALT: 9 U/L (ref 0–44)
AST: 11 U/L — ABNORMAL LOW (ref 15–41)
Albumin: 3.1 g/dL — ABNORMAL LOW (ref 3.5–5.0)
Alkaline Phosphatase: 231 U/L — ABNORMAL HIGH (ref 38–126)
Anion gap: 16 — ABNORMAL HIGH (ref 5–15)
BUN: 15 mg/dL (ref 8–23)
CO2: 19 mmol/L — ABNORMAL LOW (ref 22–32)
Calcium: 9 mg/dL (ref 8.9–10.3)
Chloride: 100 mmol/L (ref 98–111)
Creatinine: 1.01 mg/dL — ABNORMAL HIGH (ref 0.44–1.00)
GFR, Estimated: 58 mL/min — ABNORMAL LOW (ref >60)
Glucose, Bld: 236 mg/dL — ABNORMAL HIGH (ref 70–99)
Potassium: 4.4 mmol/L (ref 3.5–5.1)
Sodium: 135 mmol/L (ref 135–145)
Total Bilirubin: 0.6 mg/dL (ref 0.3–1.2)
Total Protein: 6.1 g/dL — ABNORMAL LOW (ref 6.5–8.1)

## 2021-10-08 LAB — URINALYSIS, ROUTINE W REFLEX MICROSCOPIC
Bilirubin Urine: NEGATIVE
Glucose, UA: 50 mg/dL — AB
Ketones, ur: NEGATIVE mg/dL
Nitrite: POSITIVE — AB
Protein, ur: 30 mg/dL — AB
Specific Gravity, Urine: 1.01 (ref 1.005–1.030)
WBC, UA: >50 WBC/hpf — ABNORMAL HIGH (ref 0–5)
pH: 5 (ref 5.0–8.0)

## 2021-10-08 LAB — MAGNESIUM: Magnesium: 1.2 mg/dL — ABNORMAL LOW (ref 1.7–2.4)

## 2021-10-08 LAB — CBC WITH DIFFERENTIAL (CANCER CENTER ONLY)
Abs Immature Granulocytes: 0.51 10*3/uL — ABNORMAL HIGH (ref 0.00–0.07)
Basophils Absolute: 0.1 10*3/uL (ref 0.0–0.1)
Basophils Relative: 0 %
Eosinophils Absolute: 0 10*3/uL (ref 0.0–0.5)
Eosinophils Relative: 0 %
HCT: 31.8 % — ABNORMAL LOW (ref 36.0–46.0)
Hemoglobin: 10.4 g/dL — ABNORMAL LOW (ref 12.0–15.0)
Immature Granulocytes: 3 %
Lymphocytes Relative: 5 %
Lymphs Abs: 1.1 10*3/uL (ref 0.7–4.0)
MCH: 29.5 pg (ref 26.0–34.0)
MCHC: 32.7 g/dL (ref 30.0–36.0)
MCV: 90.1 fL (ref 80.0–100.0)
Monocytes Absolute: 1.6 10*3/uL — ABNORMAL HIGH (ref 0.1–1.0)
Monocytes Relative: 8 %
Neutro Abs: 16.1 10*3/uL — ABNORMAL HIGH (ref 1.7–7.7)
Neutrophils Relative %: 84 %
Platelet Count: 265 10*3/uL (ref 150–400)
RBC: 3.53 MIL/uL — ABNORMAL LOW (ref 3.87–5.11)
RDW: 21.4 % — ABNORMAL HIGH (ref 11.5–15.5)
WBC Count: 19.4 10*3/uL — ABNORMAL HIGH (ref 4.0–10.5)
nRBC: 0.1 % (ref 0.0–0.2)

## 2021-10-08 LAB — SAMPLE TO BLOOD BANK

## 2021-10-08 LAB — GLUCOSE, CAPILLARY: Glucose-Capillary: 170 mg/dL — ABNORMAL HIGH (ref 70–99)

## 2021-10-08 MED ORDER — ONDANSETRON HCL 4 MG/2ML IJ SOLN: 4.0000 mg | Freq: Four times a day (QID) | INTRAMUSCULAR | Status: DC | PRN

## 2021-10-08 MED ORDER — INSULIN ASPART 100 UNIT/ML IJ SOLN
0.0000 [IU] | Freq: Three times a day (TID) | INTRAMUSCULAR | Status: DC
  Administered 2021-10-09: 1 [IU] via SUBCUTANEOUS
  Administered 2021-10-09: 3 [IU] via SUBCUTANEOUS
  Administered 2021-10-10 – 2021-10-11 (×5): 2 [IU] via SUBCUTANEOUS

## 2021-10-08 MED ORDER — ASPIRIN EC 81 MG PO TBEC
81.0000 mg | DELAYED_RELEASE_TABLET | Freq: Every day | ORAL | Status: DC
  Administered 2021-10-09 – 2021-10-12 (×4): 81 mg via ORAL
  Filled 2021-10-08 (×4): qty 1

## 2021-10-08 MED ORDER — MAGNESIUM SULFATE 2 GM/50ML IV SOLN
2.0000 g | Freq: Once | INTRAVENOUS | Status: AC
Start: 2021-10-08 — End: 2021-10-08
  Administered 2021-10-08: 2 g via INTRAVENOUS
  Filled 2021-10-08: qty 50

## 2021-10-08 MED ORDER — HEPARIN SODIUM (PORCINE) 5000 UNIT/ML IJ SOLN
5000.0000 [IU] | Freq: Three times a day (TID) | INTRAMUSCULAR | Status: DC
  Administered 2021-10-08 – 2021-10-11 (×8): 5000 [IU] via SUBCUTANEOUS
  Filled 2021-10-08 (×7): qty 1

## 2021-10-08 MED ORDER — OXYCODONE HCL ER 10 MG PO T12A
10.0000 mg | EXTENDED_RELEASE_TABLET | Freq: Two times a day (BID) | ORAL | Status: DC
Start: 2021-10-08 — End: 2021-10-08

## 2021-10-08 MED ORDER — CLOPIDOGREL BISULFATE 75 MG PO TABS
75.0000 mg | ORAL_TABLET | Freq: Every day | ORAL | Status: DC
  Administered 2021-10-09: 75 mg via ORAL
  Filled 2021-10-08: qty 1

## 2021-10-08 MED ORDER — GEMFIBROZIL 600 MG PO TABS
600.0000 mg | ORAL_TABLET | Freq: Two times a day (BID) | ORAL | Status: DC
  Administered 2021-10-09 – 2021-10-12 (×7): 600 mg via ORAL
  Filled 2021-10-08 (×8): qty 1

## 2021-10-08 MED ORDER — POLYETHYLENE GLYCOL 3350 17 G PO PACK: 17.0000 g | PACK | Freq: Two times a day (BID) | ORAL | Status: DC | PRN

## 2021-10-08 MED ORDER — VANCOMYCIN HCL 750 MG/150ML IV SOLN
750.0000 mg | INTRAVENOUS | Status: DC
  Filled 2021-10-08: qty 150

## 2021-10-08 MED ORDER — OXYCODONE HCL 5 MG PO TABS
5.0000 mg | ORAL_TABLET | Freq: Four times a day (QID) | ORAL | Status: DC | PRN
  Administered 2021-10-11 (×2): 5 mg via ORAL
  Filled 2021-10-08 (×2): qty 1

## 2021-10-08 MED ORDER — ATORVASTATIN CALCIUM 40 MG PO TABS
40.0000 mg | ORAL_TABLET | Freq: Every morning | ORAL | Status: DC
  Administered 2021-10-09 – 2021-10-11 (×3): 40 mg via ORAL
  Filled 2021-10-08 (×2): qty 1

## 2021-10-08 MED ORDER — MORPHINE SULFATE (PF) 2 MG/ML IV SOLN: 2.0000 mg | INTRAVENOUS | Status: DC | PRN

## 2021-10-08 MED ORDER — INSULIN ASPART 100 UNIT/ML IJ SOLN: 0.0000 [IU] | Freq: Every day | INTRAMUSCULAR | Status: DC

## 2021-10-08 MED ORDER — VANCOMYCIN HCL 1250 MG/250ML IV SOLN
1250.0000 mg | Freq: Once | INTRAVENOUS | Status: AC
  Administered 2021-10-08: 1250 mg via INTRAVENOUS
  Filled 2021-10-08: qty 250

## 2021-10-08 MED ORDER — LEVOTHYROXINE SODIUM 25 MCG PO TABS
125.0000 ug | ORAL_TABLET | Freq: Every day | ORAL | Status: DC
  Administered 2021-10-09 – 2021-10-12 (×4): 125 ug via ORAL
  Filled 2021-10-08 (×5): qty 1

## 2021-10-08 MED ORDER — FERROUS SULFATE 325 (65 FE) MG PO TABS
325.0000 mg | ORAL_TABLET | Freq: Two times a day (BID) | ORAL | Status: DC
  Administered 2021-10-09 – 2021-10-12 (×7): 325 mg via ORAL
  Filled 2021-10-08 (×7): qty 1

## 2021-10-08 MED ORDER — SODIUM CHLORIDE 0.9 % IV SOLN
2.0000 g | Freq: Two times a day (BID) | INTRAVENOUS | Status: DC
  Administered 2021-10-08 – 2021-10-12 (×8): 2 g via INTRAVENOUS
  Filled 2021-10-08 (×8): qty 2

## 2021-10-08 MED ORDER — PANTOPRAZOLE SODIUM 40 MG PO TBEC
40.0000 mg | DELAYED_RELEASE_TABLET | Freq: Two times a day (BID) | ORAL | Status: DC
  Administered 2021-10-08 – 2021-10-12 (×8): 40 mg via ORAL
  Filled 2021-10-08 (×8): qty 1

## 2021-10-08 MED ORDER — SODIUM CHLORIDE 0.9 % IV SOLN: INTRAVENOUS | Status: DC

## 2021-10-08 NOTE — Patient Instructions (Signed)
DRESSING CHANGES right foot:   PHARMACY SHOPPING LIST: Saline or Wound Cleanser for cleaning wound 2 x 2 inch sterile gauze for cleaning wound Iodosorb Gel  A. IF DISPENSED, WEAR SURGICAL SHOE OR WALKING BOOT AT ALL TIMES.  B. IF PRESCRIBED ORAL ANTIBIOTICS, TAKE ALL MEDICATION AS PRESCRIBED UNTIL ALL ARE GONE.  C. IF DOCTOR HAS DESIGNATED NONWEIGHTBEARING STATUS, PLEASE ADHERE TO INSTRUCTIONS.  1. KEEP right foot DRY AT ALL TIMES!!!!  2. CLEANSE ULCER WITH SALINE OR WOUND CLEANSER.  3. DAB DRY WITH GAUZE SPONGE.  4. APPLY A LIGHT AMOUNT OF Iodosorb Gel TO BASE OF ULCER.  5. APPLY OUTER DRESSING AS INSTRUCTED.  6. WEAR SURGICAL SHOE/BOOT DAILY AT ALL TIMES. IF SUPPLIED, WEAR HEEL PROTECTORS AT ALL TIMES WHEN IN BED.  7. DO NOT WALK BAREFOOT!!!  8.  IF YOU EXPERIENCE ANY FEVER, CHILLS, NIGHTSWEATS, NAUSEA OR VOMITING, ELEVATED OR LOW BLOOD SUGARS, REPORT TO EMERGENCY ROOM.  9. IF YOU EXPERIENCE INCREASED REDNESS, PAIN, SWELLING, DISCOLORATION, ODOR, PUS, DRAINAGE OR WARMTH OF YOUR FOOT, REPORT TO EMERGENCY ROOM.

## 2021-10-08 NOTE — Progress Notes (Signed)
[place] OFFICE PROGRESS NOTE   Diagnosis: Rectal cancer  INTERVAL HISTORY:   [name] presents for an unscheduled visit to evaluate lethargy.  She completed cycle 5 FOLFOX 09/17/2021.  She was seen in follow-up at [facility] on 10/01/2021.  CT showed a stable solid 3 mm right upper lobe pulmonary nodule, new mild right lower lobe bronchial wall thickening, mucus impaction and tree-in-bud pulmonary nodules; unchanged findings of rectal malignancy status posttreatment; marked thickening of the proximal duodenal wall.  Her case is scheduled to be presented at conference at [facility].  Her daughter contacted the office earlier today to report increased lethargy, shortness of breath with ambulation, requiring significant assistance to ambulate at home, nausea with vomiting.  She is accompanied by her son.  He reports she has been doing poorly for about 2 weeks.  [name] is confused and intermittently able to participate in our conversation.  Her son reports she is weak with minimal oral intake.  She is having intermittent nausea/vomiting.  Her daughter who is present by phone reports she vomits after taking any medications.  She has had no fever or chills.  She denies shortness of breath.  She reports noticing blood in the colostomy bag.  Her daughter is not aware of any bleeding.  [name] complains of left-sided abdominal pain.  Her son reports she was seen at podiatry earlier today due to wounds on her right foot.  He reports she has an infection.  Objective:  Vital signs in last 24 hours:  Blood pressure (!) 152/83, pulse (!) 120, temperature 97.8 F (36.6 C), temperature source Oral, resp. rate 20, height 5' (1.524 m), SpO2 99 %.    HEENT: Mild white coating over tongue. Resp: Lungs with scattered wheezes.  No respiratory distress. Cardio: Regular rate and rhythm. GI: Abdomen is soft.  Tender left upper abdomen.  Brown stool in the ostomy collection bag. Vascular: No leg  edema. Neuro: She is awake and talking.  Follows some commands.  Answers questions intermittently.  Seems confused.  She is able to move all extremities. Skin: She has a wound dorsal aspect right great toe, second wound inferior to the first wound.  The second wound is bleeding. Port-A-Cath without erythema.  Lab Results:  Lab Results  Component Value Date   WBC 19.4 (H) 10/08/2021   HGB 10.4 (L) 10/08/2021   HCT 31.8 (L) 10/08/2021   MCV 90.1 10/08/2021   PLT 265 10/08/2021   NEUTROABS 16.1 (H) 10/08/2021    Imaging:  No results found.  Medications: I have reviewed the patient's current medications.  Assessment/Plan: Rectal cancer Partially obstructing mass beginning at 10 cm from the anal verge on colonoscopy 01/23/2021, biopsy-adenocarcinoma, immunohistochemical stains at [facility]-CK20, CDX-2, and SATB2 positive, patchy strong staining for p16.  The rectal tumor has a distinct immunohistochemical profile suggesting a primary colorectal tumor as opposed to metastatic cervical cancer. CT abdomen/pelvis 01/18/2021-bilateral hydronephrosis, endometrial fluid collection, prominent stool in the colon MRI pelvis 01/23/2021-tumor at 7.6 cm from the anal verge, abnormal signal bridge in the cervix, mesorectum, and anterior aspect of the rectum-similar to MRI from 2020, MRI stage could not be defined secondary to posttreatment changes in the pelvis 03/18/2021-total abdominal hysterectomy, cystoscopy with insertion of ureteral stents, exploratory laparotomy, aborted low anterior resection, descending loop colostomy--right and left fallopian tubes and ovaries negative for malignancy; excision portion of cervix positive for adenocarcinoma; uterus with invasive moderately differentiated adenocarcinoma of the cervix, HPV associated, invading through full-thickness of the cervix/lower  uterine segment and into parametrial tissue, margins of resection received disrupted, cannot be evaluated; remaining uterus with  extensive endocervical adenocarcinoma in situ involving lower uterine segment and replacing endometrium.  This carcinoma was felt to be a separate primary from the rectal tumor. PET scan at [facility] 04/11/2021-intense FDG activity in region of known rectal mass.  Hypermetabolic left external iliac and right inguinal lymph nodes.  Minimal FDG uptake and small right supraclavicular lymph node.  No uptake seen in the cervix. 04/23/2021 FNA right inguinal lymph node-negative for malignancy Evaluated by Dr. Leamon Arnt 05/21/2021, 05/28/2021-appears to have 2 synchronous malignancies (cervical adenocarcinoma and rectal adenocarcinoma); further surgery which would entail pelvic exenteration not recommended by gynecologic oncology or colorectal surgery.  Full doses of radiotherapy not felt to be feasible.  Recommended 3 months of CAPOX and then repeat MRI of abdomen/pelvis, chest CT and colonoscopy. Cycle 1 Xeloda 06/13/2021 Cycle 1 FOLFOX 07/08/2021, oxaliplatin dose reduced secondary to pre-existing neuropathy Cycle 2 FOLFOX 07/29/2021, Udenyca Cycle 3 FOLFOX 08/12/2021, Udenyca Cycle 4 FOLFOX 09/02/2021, Udenyca Cycle 5 FOLFOX 09/17/2021, Udenyca CTs at [facility] 10/01/2021-unchanged findings of rectal malignancy status posttreatment.  Marked thickening of the proximal duodenal wall.  Unchanged bilateral inguinal lymph nodes which are not enlarged but avid on prior PET.  Unchanged diffuse bladder wall thickening and perivesicular stranding which may represent radiation cystitis.  New mild right lower lobe bronchial wall thickening, mucus impaction and tree-in-bud pulmonary nodules.     Bilateral hydronephrosis-secondary to urinary retention? Hematometra found on pelvic MRI 01/23/2021, evaluated by gynecologic oncology Cervical cancer November 1994, stage Ib, treated with external beam radiation and brachytherapy at [facility] Diabetes Neuropathy Chronic pain secondary to #6 Recurrent urinary tract infections History of a CVA History  of hyperthyroidism G3, P3 COVID-19 infection January 2021 Admission 08/25/2021 with a urinary tract infection-culture negative Anemia secondary to chronic disease, chemotherapy, and rectal bleeding-1 unit packed red blood cells 08/26/2021 Hospital admission 09/23/2021-symptomatic anemia    Disposition: [name] presents with altered mental status, nausea/vomiting, left-side abdominal pain.  Etiology is unclear.  The white count is elevated.  She may have an infection.  She has 2 wounds on the right foot evaluated by podiatry earlier today, debridement performed.  She has a history of recurrent UTIs.  We are making arrangements for hospital admission.  Patient seen with Dr. Benay Spice.    Ned Card ANP/GNP-BC   10/08/2021  2:21 PM  This was a shared visit with Ned Card.  Ms. Begin was interviewed and examined.  I discussed the case with Dr. Zigmund Daniel.  She presents today with altered mental status and nausea/vomiting.  She has 2 toe ulcerations.  She may have sepsis related to the toe lesions, urosepsis, or infection from another source.  The nausea and vomiting may be related to the proximal duodenal thickening noted on CT at [facility] last week.  She will be admitted for further evaluation and management by the internal medicine service.  I will check on her 10/09/2021.  We are waiting to hear from Dr. Leamon Arnt at [facility] regarding further treatment plans for the cervical/rectal carcinoma.  I was present for greater than 50% of today's visit.  I performed medical decision making.  Julieanne Manson, MD

## 2021-10-08 NOTE — Telephone Encounter (Signed)
Spoke w/daughter and instructed her to bring [name] in now to be seen by NP with labwork today. She reports her brother is 15 minutes away from house and will bring her directly to office.

## 2021-10-08 NOTE — Addendum Note (Signed)
Addended by: Geni Bers on: 10/08/2021 02:32 PM   Modules accepted: Orders

## 2021-10-08 NOTE — Telephone Encounter (Signed)
Daughter called concerned with her increased lethargy. Difficult to keep her awake during the day. Shortness of breath with ambulation and requires maximal assist to ambulate safely in home. Reports she has diminished sensation from knees down, but she is able to lift legs to ambulate. Poor bladder control, but this is not new and has not worsened. Nausea daily w/vomiting at least 1/day. Takes sips and bites only. Ostomy output is good.  Mostly concerned w/her lethargy and weakness. Scheduled for lab/OV on 11/28.

## 2021-10-08 NOTE — Progress Notes (Signed)
Pharmacy Antibiotic Note  [name] is a 75 y.o. female with a h/o rectal cancer on chemotherapy admitted on 10/08/2021 with sepsis.  Pharmacy has been consulted for vancomycin and cefepime dosing.  Plan: Vancomycin 1250 mg iv once followed by 750 mg IV Q 24 hrs. Goal AUC 400-550. Expected AUC: 538 SCr used: 1.01  Cefepime 2 g iv q 12 hours  Will f/u renal function, culture results, and clinical course Levels if/when indicated     Temp (24hrs), Avg:97.6 F (36.4 C), Min:97.5 F (36.4 C), Max:97.8 F (36.6 C)  Recent Labs  Lab 10/03/21 1104 10/08/21 1207  WBC 12.4* 19.4*  CREATININE 0.90 1.01*    Estimated Creatinine Clearance: 38.5 mL/min (A) (by C-G formula based on SCr of 1.01 mg/dL (H)).    Allergies  Allergen Reactions   Codeine Nausea Only    Antimicrobials this admission: 11/22 vancomycin >>  11/22 cefepime >>   Dose adjustments this admission:  Microbiology results: 11/22 BCx:  11/22 UCx:   Thank you for allowing pharmacy to be a part of this patient's care.  Ulice Dash D 10/08/2021 6:14 PM

## 2021-10-08 NOTE — H&P (Addendum)
History and Physical    [name] QTM:[id number] DOB: [birth year] DOA: 10/08/2021  PCP: Everardo Beals, NP   Patient coming from: [place]  Chief Complaint: Nausea, vomiting, confusion  HPI: [name] is a 75 y.o. female with medical history significant of rectal cancer currently following with oncology( Dr [name]) currently on chemotherapy, status post diverting loop colostomy on 5/22 at [facility] , history of cervical cancer/uterine cancer, diabetes type 2, recurrent urine tract infections, history of CVA, who presented to the cancer center on unscheduled visit for the evaluation of weakness, nausea, vomiting and confusion.  She also complained of left-sided abdominal pain.  Daughter contacted the cancer center today with report of increased lethargy, shortness of breath with ambulation, nausea and vomiting.  She has been reported to be doing poorly for the last 2 weeks, had decreased oral intake.  She was also confused.  As per the daughter, she lives with her husband and is ambulatory with help of walker.  She is usually alert and oriented. She completed her cycle 5 FOLFOX on 09/17/2021 and she follows at  oncology here but also follows at [facility] because she got colostomy there, she was last seen on 10/01/2021 at [facility]. She was admitted here on 09/23/2018 and was discharged on 09/22/2021 after she presented with symptomatic anemia with hemoglobin of 5.8, got blood transfusion with 3 units. Patient seen and examined the bedside this evening.  During my evaluation, she was hemodynamically stable.  Daughter at the bedside.  Patient appeared sleepy, weak, confused but not agitated.  There was no report of fever, chills, chest pain, cough, diarrhea, dysuria.  As per the daughter, she was also complaining of vague abdominal pain in her abdomen.  ED Course: Sepsis was suspected on presentation.  She had leukocytosis with WBC of 19.4.  Blood pressure was stable, she was in sinus tachycardia  and afebrile.  Magnesium was low at 1.2.  Creatinine was mildly elevated from baseline.  She appeared dehydrated on presentation.  Patient was sent for direct admission from cancer center for the management of possible sepsis, dehydration.  Started on broad-spectrum antibiotics.  Review of Systems: As per HPI otherwise 10 point review of systems negative.    Past Medical History:  Diagnosis Date   Arthritis    Basal ganglia stroke ([place])    Bilateral lower extremity edema    Cancer ([place])    Carotid artery occlusion    Flank pain    Full dentures    History of cervical cancer    11/ 1994  Stage IB  s/p  high dose radiation brachytherapy @ [facility] 01/ 1995--- per pt no recurrence   History of chronic bronchitis    History of hyperthyroidism    d 03/ 2011---due to grave's disease--- s/p  RAI treatment 04/ 2011   History of kidney stones    History of sepsis 05/03/2018   due to UTI with klebsiella/ pyelonephritis/ ureteral obstruction cause by stone   Hyperlipidemia    Hypothyroidism, postradioiodine therapy    endocrinologist--  dr Loanne Drilling--  dx graves disease and s/p RAI i131 treatement 4/ 2011   Nauseated    Oral thrush 05/03/2018   Pneumonia    Type 2 diabetes mellitus treated with insulin ([place])    FOLLOWED BY PCP   Urgency of urination    Wears glasses     Past Surgical History:  Procedure Laterality Date   APPENDECTOMY     BIOPSY  01/23/2021   Procedure: BIOPSY;  Surgeon: Mauri Pole, MD;  Location: [facility];  Service: Endoscopy;;   CAROTID ENDARTERECTOMY Right 03/28/2020   CATARACT EXTRACTION W/ INTRAOCULAR LENS  IMPLANT, BILATERAL  2004  approx.   COLONOSCOPY WITH PROPOFOL N/A 01/23/2021   Procedure: COLONOSCOPY WITH PROPOFOL;  Surgeon: Mauri Pole, MD;  Location: [facility];  Service: Endoscopy;  Laterality: N/A;   CYSTOSCOPY WITH STENT PLACEMENT Right 05/04/2018   Procedure: CYSTOSCOPY WITH STENT PLACEMENT AND RETROGRADE PYELOGRAM;  Surgeon: Ceasar Mons, MD;  Location: [facility];  Service: Urology;  Laterality: Right;   CYSTOSCOPY/URETEROSCOPY/HOLMIUM LASER/STENT PLACEMENT Right 05/19/2018   Procedure: CYSTOSCOPY, URETEROSCOPY/HOLMIUM LASER, STONE BASKETRY/ STENT EXCHANGE;  Surgeon: Ceasar Mons, MD;  Location: [facility];  Service: Urology;  Laterality: Right;   ENDARTERECTOMY Right 03/28/2020   Procedure: RIGHT CAROTID ENDARTERECTOMY with PATCH ANGIOPLASTY;  Surgeon: Rosetta Posner, MD;  Location: [facility] OR;  Service: Vascular;  Laterality: Right;   EXCISION MASS LEFT CHEST WALL  09-20-2007   dr [place] Silos  [facility]   Radioactive Iodine Therapy     for thyroid   REVISION TOTAL HIP ARTHROPLASTY Left early 2000s   SUBMUCOSAL TATTOO INJECTION  01/23/2021   Procedure: SUBMUCOSAL TATTOO INJECTION;  Surgeon: Mauri Pole, MD;  Location: [facility];  Service: Endoscopy;;   TANDEM RING INSERTION  1995   dr Aldean Ast @ [facility]   EUA w/ tandem placement in ovid  (for direct high dose radiation brachytherapy , cervical cancer)   TOTAL HIP ARTHROPLASTY Left 1990s   TRANSTHORACIC ECHOCARDIOGRAM  08/07/2017   mild focal basal hypertrophy of the septum,  ef 02-54%, grade 1 diastolic dysfunction/  atrial septum with lipomatous hypertrophy/  trivial PR     reports that she quit smoking about 39 years ago. Her smoking use included cigarettes. She has never used smokeless tobacco. She reports that she does not drink alcohol and does not use drugs.  Allergies  Allergen Reactions   Codeine Nausea Only    Family History  Problem Relation Age of Onset   Emphysema Father    Diabetes Father    Emphysema Sister    Emphysema Brother    Cancer Mother        Skin Cancer   Diabetes Mother    Colon cancer Neg Hx    Esophageal cancer Neg Hx    Rectal cancer Neg Hx    Stomach cancer Neg Hx      Prior to Admission medications   Medication Sig Start Date End Date Taking? Authorizing Provider  acetaminophen (TYLENOL)  500 MG tablet Take 500 mg by mouth daily as needed for headache (pain).    [provider]  ALENDRONATE SODIUM PO     [provider]  ALPRAZOLAM PO     [provider]  aspirin 81 MG EC tablet Take 1 tablet (81 mg total) by mouth daily. RESUME AFTER 1 WEEK Patient taking differently: Take 81 mg by mouth every morning. 09/03/21   Bonnielee Haff, MD  atorvastatin (LIPITOR) 40 MG tablet Take 40 mg by mouth every morning. 12/15/19   [provider]  baclofen (LIORESAL) 10 MG tablet Take 10 mg by mouth 2 (two) times daily. 03/22/21   [provider]  cephALEXin (KEFLEX) 250 MG capsule Take 1 capsule (250 mg total) by mouth daily. RESUME AFTER YOU HAVE COMPLETED THE COURSE OF CIPRO Patient taking differently: Take 250 mg by mouth every morning. 08/28/21   Bonnielee Haff, MD  clopidogrel (PLAVIX) 75 MG tablet  Take 1 tablet (75 mg total) by mouth daily. RESUME AFTER 1 WEEK ON 09/03/21 Patient taking differently: Take 75 mg by mouth at bedtime. 09/03/21   Bonnielee Haff, MD  docusate sodium (COLACE) 100 MG capsule Take 100 mg by mouth 2 (two) times daily.    [provider]  Ergocalciferol (VITAMIN D2 PO)     [provider]  Fenofibrate (TRICOR PO)     [provider]  ferrous sulfate 325 (65 FE) MG tablet Take 1 tablet (325 mg total) by mouth 2 (two) times daily with a meal. 08/28/21 11/26/21  Bonnielee Haff, MD  gabapentin (NEURONTIN) 300 MG capsule Take 300-600 mg by mouth See admin instructions. Take one capsule (300 mg) by mouth every morning and two capsules (600 mg) at night    [provider]  gemfibrozil (LOPID) 600 MG tablet Take 600 mg by mouth 2 (two) times daily.    [provider]  GLYBURIDE-METFORMIN PO     [provider]  insulin degludec (TRESIBA FLEXTOUCH) 100 UNIT/ML FlexTouch Pen Inject 10 Units into the skin daily. Patient taking differently: Inject 20 Units into the skin daily as  needed (CBG >200). 08/28/21   Bonnielee Haff, MD  Lancets [facility] DELICA PLUS [id number]) MISC Apply topically 2 (two) times daily. 12/02/20   [provider]  levothyroxine (SYNTHROID) 125 MCG tablet Take 1 tablet (125 mcg total) by mouth daily at 6 (six) AM. 08/29/21 11/27/21  Bonnielee Haff, MD  lidocaine-prilocaine (EMLA) cream Apply to port site 1-2 hours prior to use Patient not taking: Reported on 09/23/2021 07/08/21   Owens Shark, NP  metFORMIN (GLUCOPHAGE) 500 MG tablet Take 500 mg by mouth 2 (two) times daily. 02/01/21   [provider]  nystatin (MYCOSTATIN/NYSTOP) powder Apply 1 application topically 2 (two) times daily. To groin rash Patient taking differently: Apply 1 application topically 2 (two) times daily as needed (groin rash). 07/29/21   Ladell Pier, MD  nystatin cream (MYCOSTATIN) Apply 1 application topically 2 (two) times daily as needed (groin rash). Mix with triamcinolone cream    [provider]  ONETOUCH VERIO test strip 1 each 2 (two) times daily. 11/29/20   [provider]  oxyCODONE ER (XTAMPZA ER) 9 MG C12A Take 9 mg by mouth every 12 (twelve) hours.    [provider]  pantoprazole (PROTONIX) 20 MG tablet Take by mouth. 10/01/21 10/01/22  [provider]  pantoprazole (PROTONIX) 20 MG tablet Take 20 mg by mouth daily. 10/01/21   [provider]  polyethylene glycol powder (GLYCOLAX/MIRALAX) 17 GM/SCOOP powder Take 17 g by mouth 2 (two) times daily as needed (constipation).    [provider]  potassium chloride SA (KLOR-CON) 20 MEQ tablet Take 1 tablet (20 mEq total) by mouth 2 (two) times daily. 10/03/21   Ladell Pier, MD  senna-docusate (SENOKOT-S) 8.6-50 MG tablet Take 1 tablet by mouth 2 (two) times daily. Patient not taking: No sig reported 08/28/21 11/26/21  Bonnielee Haff, MD  triamcinolone cream (KENALOG) 0.1 % Apply 1 application topically 2 (two) times daily as needed (groin  rash). Mix with nystatin cream 09/19/20   [provider]    Physical Exam: There were no vitals filed for this visit.  Constitutional: Weak ,, chronically ill looking Eyes: PERRL, lids and conjunctivae normal ENMT: Mucous membranes are moist.  Neck: normal, supple, no masses, no thyromegaly Respiratory: clear to auscultation bilaterally, no wheezing, no crackles. Normal respiratory effort. No accessory muscle use.  Cardiovascular: Regular rate and rhythm, no murmurs / rubs / gallops. No extremity edema.  Chemo-Port on the right chest Abdomen: no tenderness, no masses palpated. No hepatosplenomegaly. Bowel sounds positive.  Colostomy with brown stool Musculoskeletal: no clubbing / cyanosis. No joint deformity upper and lower extremities.  Skin: no rashes, lesions, ulcers. No induration Neurologic: CN 2-12 grossly intact.  Confused    Foley Catheter:None  Labs on Admission: I have personally reviewed following labs and imaging studies  CBC: Recent Labs  Lab 10/03/21 1104 10/08/21 1207  WBC 12.4* 19.4*  NEUTROABS 9.9* 16.1*  HGB 9.9* 10.4*  HCT 29.0* 31.8*  MCV 84.5 90.1  PLT 158 443   Basic Metabolic Panel: Recent Labs  Lab 10/03/21 1104 10/08/21 1207  NA 132* 135  K 2.6* 4.4  CL 95* 100  CO2 25 19*  GLUCOSE 205* 236*  BUN 14 15  CREATININE 0.90 1.01*  CALCIUM 8.6* 9.0  MG 0.7* 1.2*   GFR: Estimated Creatinine Clearance: 38.5 mL/min (A) (by C-G formula based on SCr of 1.01 mg/dL (H)). Liver Function Tests: Recent Labs  Lab 10/08/21 1207  AST 11*  ALT 9  ALKPHOS 231*  BILITOT 0.6  PROT 6.1*  ALBUMIN 3.1*   No results for input(s): LIPASE, AMYLASE in the last 168 hours. No results for input(s): AMMONIA in the last 168 hours. Coagulation Profile: No results for input(s): INR, PROTIME in the last 168 hours. Cardiac Enzymes: No results for input(s): CKTOTAL, CKMB, CKMBINDEX, TROPONINI in the last 168 hours. BNP (last 3 results) No results for  input(s): PROBNP in the last 8760 hours. HbA1C: No results for input(s): HGBA1C in the last 72 hours. CBG: No results for input(s): GLUCAP in the last 168 hours. Lipid Profile: No results for input(s): CHOL, HDL, LDLCALC, TRIG, CHOLHDL, LDLDIRECT in the last 72 hours. Thyroid Function Tests: No results for input(s): TSH, T4TOTAL, FREET4, T3FREE, THYROIDAB in the last 72 hours. Anemia Panel: No results for input(s): VITAMINB12, FOLATE, FERRITIN, TIBC, IRON, RETICCTPCT in the last 72 hours. Urine analysis:    Component Value Date/Time   COLORURINE YELLOW 09/05/2021 1833   APPEARANCEUR CLEAR 09/05/2021 1833   LABSPEC 1.025 09/05/2021 1833   PHURINE 5.5 09/05/2021 1833   GLUCOSEU NEGATIVE 09/05/2021 1833   HGBUR TRACE (A) 09/05/2021 1833   BILIRUBINUR NEGATIVE 09/05/2021 1833   [place] 09/05/2021 1833   PROTEINUR NEGATIVE 09/05/2021 1833   UROBILINOGEN 0.2 05/27/2014 1203   NITRITE NEGATIVE 09/05/2021 1833   LEUKOCYTESUR TRACE (A) 09/05/2021 1833    Radiological Exams on Admission: No results found.   Assessment/Plan Principal Problem:   Sepsis ([place]) Active Problems:   Hypothyroidism   Type 2 diabetes mellitus ([place])   Hyperlipidemia associated with type 2 diabetes mellitus ([place])   History of cervical cancer   Hypomagnesemia   Normocytic anemia   Iron deficiency anemia due to chronic blood loss   Rectal mass   AKI (acute kidney injury) ([place])   Sepsis: Presented with leukocytosis, lethargy, tachycardia.  Has history of recurrent urinary tract infection .source of sepsis currently unknown.  Continue broad-spectrum antibiotics for now.  Started on vancomycin and cefepime.  We will get urine culture urinalysis, blood culture.  AKI/dehydration: Continue IV fluids.  Mildly elevated anion gap is most likely acidosis secondary to AKI  Nausea /vomiting: Could be secondary to sepsis.  But recent CT scan done at [facility] showed unchanged findings of rectal malignancy status  posttreatment; marked thickening of the proximal duodenal wall.  This proximal  duodenal wall thickening could also cause persistent nausea and vomiting.  We will consult gastroenterology for evaluation of her proximal duodenal wall thickening.  I have sent secure chat to [facility] for consultation for tomorrow.  Please follow-up in a.m. continue PPI  History of rectal adenocarcinoma/cervical adenocarcinoma: Follows with  oncology here and also at [facility].  Not a surgical candidate for cervical adenocarcinoma as per [facility] oncology note.  Oncology should follow her here. Patient complains of vague abdominal discomfort.  CT scan done just about a week ago on 10/01/2021 showed unchanged findings of rectal malignancy.  If she continues to have severe abdominal pain, we need another follow-up CT scan here.  History of hyperlipidemia: Takes Lipitor 40 mg daily.  Also on gemfibrozil  History of iron-deficiency anemia: Likely secondary to malignancy/rectal cancer causing chronic blood loss.  Continue iron supplementation  History of type 2 diabetes: On insulin at home.  Monitor blood sugars, started on sliding scale insulin  History of hypothyroidism: Continue levothyroxine  History of CVA: On aspirin and Plavix.  Hypomagnesemia: Supplemented with magnesium  Left foot wound: She had a callus-like lesion on her left foot which was resected by podiatry, the wound does not look infected.   Severity of Illness: The appropriate patient status for this patient is INPATIENT.  DVT prophylaxis: Heparin [place] Code Status: Full Family Communication: Daughter at bedside Consults called: Secure chat sent to [place] MD [facility]  10/08/2021, 6:08 PM

## 2021-10-08 NOTE — Progress Notes (Signed)
[facility] picked up patient to transport to [place] [id number]. Daughter and son to follow her to hospital. Called report to nurse Sammie on [address].

## 2021-10-08 NOTE — Progress Notes (Signed)
ANNUAL DIABETIC FOOT EXAM  Subjective: [name] presents today for annual diabetic foot examination, at risk foot care with history of diabetic neuropathy, painful elongated mycotic toenails 1-5 bilaterally which are tender when wearing enclosed shoe gear. Pain is relieved with periodic professional debridement., and preulcerative lesion(s) L hallux.  Patient is accompanied by her daughter, [name], on today's visit. She has completed her chemotherapy and is awaiting further instructions from her oncologist and surgeon as to whether she will have any surgery.    Patient relates 40 year h/o diabetes.  Patient denies any h/o foot wounds.  Patient has been diagnosed with neuropathy and it is managed with gabapentin.  Patient's blood sugar was 142 mg/dl this morning.   [name] Beals, NP is patient's PCP. Last visit was about one month ago per patient recall.  Daughter states patient scraped her right foot about one month ago and cut it near the plantar aspect of the first metatarsal right foot. They kept it clean and applied antibiotic cream daily. They assumed it was healed, but just had a scab on it. [name] denies any pain from area, but she is neuropathic. She denies any fever, chills, or night sweats. She does endorse nausea/vomiting due to chemotherapy. She is on Keflex 250 mg daily due to chronic UTI.  Daughter has put in phone call to oncologist today due to patient may be dehydrated. Patient endorses one episode of dizziness in office, but it passed. Oncologist will be ordering labs today.  Past Medical History:  Diagnosis Date   Arthritis    Basal ganglia stroke ([place])    Bilateral lower extremity edema    Cancer (HCC)    Carotid artery occlusion    Flank pain    Full dentures    History of cervical cancer    11/ 1994  Stage IB  s/p  high dose radiation brachytherapy @ [facility] 01/ 1995--- per pt no recurrence   History of chronic bronchitis    History of hyperthyroidism     d 03/ 2011---due to grave's disease--- s/p  RAI treatment 04/ 2011   History of kidney stones    History of sepsis 05/03/2018   due to UTI with klebsiella/ pyelonephritis/ ureteral obstruction cause by stone   Hyperlipidemia    Hypothyroidism, postradioiodine therapy    endocrinologist--  dr Loanne Drilling--  dx graves disease and s/p RAI i131 treatement 4/ 2011   Nauseated    Oral thrush 05/03/2018   Pneumonia    Type 2 diabetes mellitus treated with insulin ([place])    FOLLOWED BY PCP   Urgency of urination    Wears glasses    Patient Active Problem List   Diagnosis Date Noted   Symptomatic anemia 09/23/2021   Pressure injury of skin 08/26/2021   AKI (acute kidney injury) ([place]) 08/25/2021   Sepsis secondary to UTI ([place]) 08/25/2021   Goals of care, counseling/discussion 07/01/2021   Coronary artery disease 03/13/2021   Cellulitis of right lower limb 02/22/2021   Malignant neoplasm of overlapping sites of cervix ([place]) 02/18/2021   Rectal cancer ([place]) 01/29/2021   Rectal mass    Hydronephrosis    Iron deficiency anemia due to chronic blood loss    Non-intractable vomiting    Lower abdominal pain    UTI (urinary tract infection) 01/18/2021   Acute toxic encephalopathy 09/30/2020   Peripheral nerve disease 09/07/2020   Vitamin D deficiency 09/07/2020   Carotid artery stenosis, symptomatic, right 03/28/2020   Preoperative clearance 03/21/2020  History of COVID-19 02/22/2020   Carotid artery disease ([place]) 02/22/2020   History of CVA (cerebrovascular accident) 02/11/2020   Normocytic anemia 11/29/2019   Hypokalemia 07/27/2019   Hypomagnesemia 07/27/2019   History of cervical cancer 03/18/2019   Essential hypertension 05/04/2018   Bilateral lower extremity edema 07/31/2017   Hypothyroidism 06/06/2010   Type 2 diabetes mellitus ([place]) 10/20/2008   Hyperlipidemia associated with type 2 diabetes mellitus ([place]) 10/20/2008   Past Surgical History:  Procedure Laterality Date    APPENDECTOMY     BIOPSY  01/23/2021   Procedure: BIOPSY;  Surgeon: Mauri Pole, MD;  Location: [facility];  Service: Endoscopy;;   CAROTID ENDARTERECTOMY Right 03/28/2020   CATARACT EXTRACTION W/ INTRAOCULAR LENS  IMPLANT, BILATERAL  2004  approx.   COLONOSCOPY WITH PROPOFOL N/A 01/23/2021   Procedure: COLONOSCOPY WITH PROPOFOL;  Surgeon: Mauri Pole, MD;  Location: [facility];  Service: Endoscopy;  Laterality: N/A;   CYSTOSCOPY WITH STENT PLACEMENT Right 05/04/2018   Procedure: CYSTOSCOPY WITH STENT PLACEMENT AND RETROGRADE PYELOGRAM;  Surgeon: Ceasar Mons, MD;  Location: [facility];  Service: Urology;  Laterality: Right;   CYSTOSCOPY/URETEROSCOPY/HOLMIUM LASER/STENT PLACEMENT Right 05/19/2018   Procedure: CYSTOSCOPY, URETEROSCOPY/HOLMIUM LASER, STONE BASKETRY/ STENT EXCHANGE;  Surgeon: Ceasar Mons, MD;  Location: [facility];  Service: Urology;  Laterality: Right;   ENDARTERECTOMY Right 03/28/2020   Procedure: RIGHT CAROTID ENDARTERECTOMY with PATCH ANGIOPLASTY;  Surgeon: Rosetta Posner, MD;  Location: [facility];  Service: Vascular;  Laterality: Right;   EXCISION MASS LEFT CHEST WALL  09-20-2007   dr Grandville Silos  [facility]   Radioactive Iodine Therapy     for thyroid   REVISION TOTAL HIP ARTHROPLASTY Left early 2000s   SUBMUCOSAL TATTOO INJECTION  01/23/2021   Procedure: SUBMUCOSAL TATTOO INJECTION;  Surgeon: Mauri Pole, MD;  Location: [facility];  Service: Endoscopy;;   TANDEM RING INSERTION  1995   dr Aldean Ast @ [facility]   EUA w/ tandem placement in ovid  (for direct high dose radiation brachytherapy , cervical cancer)   TOTAL HIP ARTHROPLASTY Left 1990s   TRANSTHORACIC ECHOCARDIOGRAM  08/07/2017   mild focal basal hypertrophy of the septum,  ef 44-01%, grade 1 diastolic dysfunction/  atrial septum with lipomatous hypertrophy/  trivial PR   Current Outpatient Medications on File Prior to Visit  Medication Sig Dispense Refill    acetaminophen (TYLENOL) 500 MG tablet Take 500 mg by mouth daily as needed for headache (pain).     ALENDRONATE SODIUM PO      ALPRAZOLAM PO      aspirin 81 MG EC tablet Take 1 tablet (81 mg total) by mouth daily. RESUME AFTER 1 WEEK (Patient taking differently: Take 81 mg by mouth every morning.) 30 tablet 1   atorvastatin (LIPITOR) 40 MG tablet Take 40 mg by mouth every morning.     baclofen (LIORESAL) 10 MG tablet Take 10 mg by mouth 2 (two) times daily.     cephALEXin (KEFLEX) 250 MG capsule Take 1 capsule (250 mg total) by mouth daily. RESUME AFTER YOU HAVE COMPLETED THE COURSE OF CIPRO (Patient taking differently: Take 250 mg by mouth every morning.)     clopidogrel (PLAVIX) 75 MG tablet Take 1 tablet (75 mg total) by mouth daily. RESUME AFTER 1 WEEK ON 09/03/21 (Patient taking differently: Take 75 mg by mouth at bedtime.) 21 tablet 0   docusate sodium (COLACE) 100 MG capsule Take 100 mg by mouth 2 (two) times daily.  Ergocalciferol (VITAMIN D2 PO)      Fenofibrate (TRICOR PO)      ferrous sulfate 325 (65 FE) MG tablet Take 1 tablet (325 mg total) by mouth 2 (two) times daily with a meal. 60 tablet 2   gabapentin (NEURONTIN) 300 MG capsule Take 300-600 mg by mouth See admin instructions. Take one capsule (300 mg) by mouth every morning and two capsules (600 mg) at night     gemfibrozil (LOPID) 600 MG tablet Take 600 mg by mouth 2 (two) times daily.     GLYBURIDE-METFORMIN PO      insulin degludec (TRESIBA FLEXTOUCH) 100 UNIT/ML FlexTouch Pen Inject 10 Units into the skin daily. (Patient taking differently: Inject 20 Units into the skin daily as needed (CBG >200).)     Lancets (ONETOUCH DELICA PLUS [id number]) MISC Apply topically 2 (two) times daily.     levothyroxine (SYNTHROID) 125 MCG tablet Take 1 tablet (125 mcg total) by mouth daily at 6 (six) AM. 30 tablet 2   lidocaine-prilocaine (EMLA) cream Apply to port site 1-2 hours prior to use (Patient not taking: Reported on 09/23/2021) 30 g  2   metFORMIN (GLUCOPHAGE) 500 MG tablet Take 500 mg by mouth 2 (two) times daily.     nystatin (MYCOSTATIN/NYSTOP) powder Apply 1 application topically 2 (two) times daily. To groin rash (Patient taking differently: Apply 1 application topically 2 (two) times daily as needed (groin rash).) 15 g 1   nystatin cream (MYCOSTATIN) Apply 1 application topically 2 (two) times daily as needed (groin rash). Mix with triamcinolone cream     ONETOUCH VERIO test strip 1 each 2 (two) times daily.     oxyCODONE ER (XTAMPZA ER) 9 MG C12A Take 9 mg by mouth every 12 (twelve) hours.     pantoprazole (PROTONIX) 20 MG tablet Take by mouth.     pantoprazole (PROTONIX) 20 MG tablet Take 20 mg by mouth daily.     polyethylene glycol powder (GLYCOLAX/MIRALAX) 17 GM/SCOOP powder Take 17 g by mouth 2 (two) times daily as needed (constipation).     potassium chloride SA (KLOR-CON) 20 MEQ tablet Take 1 tablet (20 mEq total) by mouth 2 (two) times daily. 60 tablet 1   senna-docusate (SENOKOT-S) 8.6-50 MG tablet Take 1 tablet by mouth 2 (two) times daily. (Patient not taking: No sig reported) 60 tablet 2   triamcinolone cream (KENALOG) 0.1 % Apply 1 application topically 2 (two) times daily as needed (groin rash). Mix with nystatin cream     No current facility-administered medications on file prior to visit.    Allergies  Allergen Reactions   Codeine Nausea Only   Social History   Occupational History   Occupation: Disabled  Tobacco Use   Smoking status: Former    Years: 5.00    Types: Cigarettes    Quit date: 05/13/1982    Years since quitting: 39.4   Smokeless tobacco: Never  Vaping Use   Vaping Use: Never used  Substance and Sexual Activity   Alcohol use: No   Drug use: No   Sexual activity: Not Currently   Family History  Problem Relation Age of Onset   Emphysema Father    Diabetes Father    Emphysema Sister    Emphysema Brother    Cancer Mother        Skin Cancer   Diabetes Mother    Colon  cancer Neg Hx    Esophageal cancer Neg Hx    Rectal cancer Neg Hx  Stomach cancer Neg Hx    Immunization History  Administered Date(s) Administered   Influenza Inj Mdck Quad Pf 08/19/2017   Influenza Split 09/07/2014   Influenza, High Dose Seasonal PF 08/30/2014, 08/07/2018, 08/31/2019   Influenza, Seasonal, Injecte, Preservative Fre 08/17/2015   Influenza,inj,quad, With Preservative 08/10/2018   PFIZER(Purple Top)SARS-COV-2 Vaccination 01/16/2020, 02/14/2020    Review of Systems: Negative except as noted in the HPI.   Objective: Vitals:   10/08/21 1216  Temp: 97.6 F (36.4 C)   [name] is a pleasant 75 y.o. female in NAD. AAO X 3.  Vascular Examination: CFT <3 seconds b/l LE. Palpable DP pulse(s) b/l LE. Faintly palpable PT pulse(s) b/l LE. Pedal hair absent. No pain with calf compression RLE. No edema noted b/l LE. No ischemia or gangrene noted b/l LE. No cyanosis or clubbing noted b/l LE.  Dermatological Examination: Pedal skin is warm and supple b/l LE. No interdigital macerations noted b/l LE. Toenails 1-5 b/l elongated, discolored, dystrophic, thickened, crumbly with subungual debris and tenderness to dorsal palpation. Preulcerative lesion noted L hallux. There is visible subdermal hemorrhage. There is no surrounding erythema, no edema, no drainage, no odor, no fluctuance.  No images are attached to the encounter.  Wound Location: submet head 1 right foot There is a moderate amount of devitalized tissue present in the wound. Predebridement Wound Measurement:  0.5  x 0.5 cm hyperkeratotic lesion with subdermal heme noted. There is underlying fluctuance and lesion is movable. No surrounding erythema, no edema.  Postdebridement Wound Measurement: 0.5 x 1.0 x 0.2 cm. Wound Base: Granular/Healthy Peri-wound: Normal Exudate: <1 cc seropurulent drainage. Blood Loss during debridement: <1 /2cc('s). No tracking nor tunneling into deep tissues. Material in wound which  inhibits healing/promotes adjacent tissue breakdown:  necrotic tissue. Description of tissue removed from ulceration today:  necrotic tissue. Sign(s) of clinical bacterial infection: seropurulent drainage   Musculoskeletal Examination: Muscle strength 5/5 to all lower extremity muscle groups bilaterally. Hammertoe(s) noted to the L 2nd toe and R 2nd toe. Utilizes wheelchair for mobility assistance.  Footwear Assessment: Does the patient wear appropriate shoes? Yes. Does the patient need inserts/orthotics? Yes.  Neurological Examination: Protective sensation diminished with 10g monofilament b/l. Vibratory sensation diminished b/l.  Xray findings right foot: No gas in tissues right foot. No bone erosion noted at location of ulceration right foot. No evidence of fracture right foot.   Hemoglobin A1C Latest Ref Rng & Units 08/26/2021 01/18/2021  HGBA1C 4.8 - 5.6 % 7.7(H) 7.0(H)  Some recent data might be hidden   Assessment: 1. Pain due to onychomycosis of toenails of both feet   2. Diabetic ulcer of right midfoot associated with diabetes mellitus due to underlying condition, with fat layer exposed ([place])   3. Pre-ulcerative calluses   4. Acquired hammertoes of both feet   5. Diabetic peripheral neuropathy associated with type 2 diabetes mellitus ([place])   6. Encounter for diabetic foot exam ([place])     ADA Risk Categorization: High Risk  Patient has one or more of the following: Loss of protective sensation Absent pedal pulses Severe Foot deformity History of foot ulcer  Plan: -Patient was evaluated and treated and all questions answered.  -Toenails 1-5 b/l were debrided in length and girth with sterile nail nippers and dremel without iatrogenic bleeding.  -Preulcerative lesion pared L hallux. Total number pared=1. -Patient/POA/Family member educated on diagnosis and treatment plan of routine ulcer debridement/wound care.  -Ulceration debridement achieved utilizing sharp excisional  debridement with sterile scalpel  blade.. Type/amount of devitalized tissue removed: necrotic tissue to healthy bleeding to level of subcutaneous tissue. -Today's ulcer size post-debridement: 0.5 x 1.0 x 0.2 cm. -Wound culture and sensitivity ordered today for diabetic ulcer right foot. -Ulceration cleansed with wound cleanser. Betadine ointment applied to base of ulceration and secured with light dressing. -Ulcer responded well to today's debridement. -Patient risk factors affecting healing of ulcer: diabetic neuropathy, foot deformity, rectal cancer s/p chemotherapy -[name] given written instructions on daily wound care for right foot ulceration. Once daily Iodosorb Gel dressing changes for right foot. -Frequency of debridements needed to achieve healing: weekly. -Consultations ordered today:  -Radiology ordered today: X-Ray: right foot. - Dispensed surgical shoe for right foot. -Patient/POA to call should there be question/concern in the interim. Return in about 1 week (around 10/15/2021) with Dr. [name].  [name], DPM

## 2021-10-08 NOTE — Plan of Care (Signed)
75 year old female with history of type 2 diabetes rectal carcinoma and a vaginal mass unknown if this is a recurrence of cervical cancer on chemotherapy.  Patient was at Dr. Gearldine Shown office today for an appointment.  Patient appears confused with nausea and vomiting.  She is afebrile.  She has a colostomy with stool.  She has a lesion on her foot which is probably infected.  Concern for sepsis from possible UTI or from a toe infection.  Patient with history of recurrent UTI.  She is being admitted for IV fluids IV antibiotics panculture.  She has a Port-A-Cath in place.  Her blood pressure was 152/83 with a pulse of 120 and she was afebrile.  Creatinine was 1.01 mag was 1.2.  Hemoglobin is 10 white count is 19(she received Neulasta with her last chemo on Sep 19 2021.).  She appears dehydrated. She is being admitted for dehydration, rule out sepsis.

## 2021-10-09 DIAGNOSIS — R6520 Severe sepsis without septic shock: Secondary | ICD-10-CM | POA: Diagnosis not present

## 2021-10-09 DIAGNOSIS — K6289 Other specified diseases of anus and rectum: Secondary | ICD-10-CM | POA: Diagnosis not present

## 2021-10-09 DIAGNOSIS — A419 Sepsis, unspecified organism: Secondary | ICD-10-CM | POA: Diagnosis not present

## 2021-10-09 DIAGNOSIS — N179 Acute kidney failure, unspecified: Secondary | ICD-10-CM | POA: Diagnosis not present

## 2021-10-09 DIAGNOSIS — E440 Moderate protein-calorie malnutrition: Secondary | ICD-10-CM | POA: Insufficient documentation

## 2021-10-09 LAB — BASIC METABOLIC PANEL
Anion gap: 13 (ref 5–15)
BUN: 15 mg/dL (ref 8–23)
CO2: 20 mmol/L — ABNORMAL LOW (ref 22–32)
Calcium: 8.4 mg/dL — ABNORMAL LOW (ref 8.9–10.3)
Chloride: 104 mmol/L (ref 98–111)
Creatinine, Ser: 0.86 mg/dL (ref 0.44–1.00)
GFR, Estimated: >60 mL/min (ref >60)
Glucose, Bld: 131 mg/dL — ABNORMAL HIGH (ref 70–99)
Potassium: 4.3 mmol/L (ref 3.5–5.1)
Sodium: 137 mmol/L (ref 135–145)

## 2021-10-09 LAB — CBC
HCT: 26.9 % — ABNORMAL LOW (ref 36.0–46.0)
Hemoglobin: 8.7 g/dL — ABNORMAL LOW (ref 12.0–15.0)
MCH: 30.1 pg (ref 26.0–34.0)
MCHC: 32.3 g/dL (ref 30.0–36.0)
MCV: 93.1 fL (ref 80.0–100.0)
Platelets: 185 10*3/uL (ref 150–400)
RBC: 2.89 MIL/uL — ABNORMAL LOW (ref 3.87–5.11)
RDW: 21.6 % — ABNORMAL HIGH (ref 11.5–15.5)
WBC: 8 10*3/uL (ref 4.0–10.5)
nRBC: 0 % (ref 0.0–0.2)

## 2021-10-09 LAB — GLUCOSE, CAPILLARY
Glucose-Capillary: 97 mg/dL (ref 70–99)
Glucose-Capillary: 148 mg/dL — ABNORMAL HIGH (ref 70–99)
Glucose-Capillary: 240 mg/dL — ABNORMAL HIGH (ref 70–99)
Glucose-Capillary: 142 mg/dL — ABNORMAL HIGH (ref 70–99)

## 2021-10-09 MED ORDER — ADULT MULTIVITAMIN W/MINERALS CH
1.0000 | ORAL_TABLET | Freq: Every day | ORAL | Status: DC
  Administered 2021-10-09 – 2021-10-12 (×4): 1 via ORAL
  Filled 2021-10-09 (×4): qty 1

## 2021-10-09 MED ORDER — BOOST PLUS PO LIQD
237.0000 mL | Freq: Three times a day (TID) | ORAL | Status: DC
  Administered 2021-10-09 – 2021-10-11 (×9): 237 mL via ORAL
  Filled 2021-10-09 (×13): qty 237

## 2021-10-09 MED ORDER — CHLORHEXIDINE GLUCONATE CLOTH 2 % EX PADS
6.0000 | MEDICATED_PAD | Freq: Every day | CUTANEOUS | Status: DC
  Administered 2021-10-09 – 2021-10-11 (×3): 6 via TOPICAL

## 2021-10-09 MED ORDER — CADEXOMER IODINE 0.9 % EX GEL
Freq: Two times a day (BID) | CUTANEOUS | Status: DC
  Filled 2021-10-09: qty 40

## 2021-10-09 NOTE — Progress Notes (Addendum)
PROGRESS NOTE    [name]  VZD:[id number] DOB: 1946-03-05 DOA: 10/08/2021 PCP: Everardo Beals, NP   Brief Narrative: This 75 years old female with PMH significant of rectal cancer currently following with Dr. Benay Spice, on chemotherapy, s/p diverting loop colostomy on 5/22 at [facility],   history of cervical cancer/uterine cancer, diabetes type 2, recurrent urine tract infections, history of CVA, who presented to the cancer center on unscheduled visit for the evaluation of weakness, nausea, vomiting and confusion. She completed her cycle 5 FOLFOX on 09/17/2021 and she follows with oncology there but also follows at [facility] because she got colostomy there, she was last seen on 10/01/2021 at [facility]. She was admitted here on 09/23/2018 and was discharged on 09/22/2021 after she presented with symptomatic anemia with hemoglobin of 5.8, got blood transfusion with 3 units.  She is found to have leukocytosis. Patient is admitted for possible sepsis, dehydration and started on broad-spectrum antibiotics.  Oncology and GI is consulted.   Assessment & Plan:   Principal Problem:   Sepsis ([place]) Active Problems:   Hypothyroidism   Type 2 diabetes mellitus ([place])   Hyperlipidemia associated with type 2 diabetes mellitus ([place])   History of cervical cancer   Hypomagnesemia   Normocytic anemia   Iron deficiency anemia due to chronic blood loss   Rectal mass   AKI (acute kidney injury) ([place])   Malnutrition of moderate degree  Suspected sepsis likely due to UTI: Patient presented with leukocytosis, lethargy, tachycardia.   She does have a history of recurrent UTI.  Source of sepsis was unknown. Continued broad-spectrum antibiotics ,  Continue Vanco and cefepime.  De-escalate antibiotics to cefepime.  Discontinue vancomycin Follow-up urine culture and blood cultures.  AKI/dehydration: Resolved with IV hydration.  Nausea /Vomiting: This could be secondary to suspected sepsis. CT scan done at [facility]  showed unchanged findings of rectal malignancy status posttreatment.   Marked thickening of proximal duodenal wall. GI is consulted, states nausea and vomiting has resolved.  Patient want to wait for EGD.   Continue Zofran as needed for nausea vomiting Patient is cleared from GI to be discharged.  History of  rectal adeno carcinoma / Cervical adenocarcinoma; Patient follows up with oncology at [facility]. Not a surgical candidate for cervical adenocarcinoma as per [facility]. Patient complains of vague abdominal discomfort.   CT scanning done a week ago showed unchanged findings of rectal malignancy. If she continues to have abdominal pain,  will consider repeat CT abdomen here.  Hyperlipidemia: Continue gemfibrozil and Lipitor  Iron deficiency anemia: Could be secondary to malignancy, rectal cancer causing chronic blood loss.  Continue iron supplementation  Type 2 diabetes: Continue sliding scale.  Hypothyroidism: Continue levothyroxine  History of CVA Continue aspirin and Plavix  Hypomagnesemia : Replaced . continue to monitor  Left foot wound: Patient does have a callus-like lesion on the left foot which was resected by podiatry recently.  Wound does not look infected.    DVT prophylaxis: Heparin subcu Code Status: Full Code Family Communication: No family at bedside Disposition Plan:   Status is: Inpatient  Remains inpatient appropriate because: Nausea and vomiting has improved but Still has leukocytosis, generalized weakness.  Consultants:  GI Oncology  Procedures: None Antimicrobials:   Anti-infectives (From admission, onward)    Start     Dose/Rate Route Frequency Ordered Stop   10/09/21 2000  vancomycin (VANCOREADY) IVPB 750 mg/150 mL        750 mg 150 mL/hr over 60 Minutes Intravenous  Every 24 hours 10/08/21 1827 04/01/27 1959   10/08/21 1930  vancomycin (VANCOREADY) IVPB 1250 mg/250 mL        1,250 mg 166.7 mL/hr over 90 Minutes Intravenous  Once  10/08/21 1827 10/08/21 2205   10/08/21 1930  ceFEPIme (MAXIPIME) 2 g in sodium chloride 0.9 % 100 mL IVPB        2 g 200 mL/hr over 30 Minutes Intravenous 2 times daily 10/08/21 1827          Subjective: Patient was seen and examined at bedside.  Overnight events noted.  Patient reports nausea and vomiting has improved.  She has mild soreness on the abdominal exam but denies any worsening pain.  Objective: Vitals:   10/09/21 0440 10/09/21 0458 10/09/21 1353 10/09/21 1452  BP: (!) 117/48 (!) 123/49 (!) 107/45   Pulse: (!) 58 (!) 58 70   Resp: 16  16   Temp: (!) 97.4 F (36.3 C)  98.1 F (36.7 C)   TempSrc: Oral  Oral   SpO2: 100%  100%   Weight:    59 kg    Intake/Output Summary (Last 24 hours) at 10/09/2021 1610 Last data filed at 10/09/2021 1355 Gross per 24 hour  Intake 1261.63 ml  Output 100 ml  Net 1161.63 ml   Filed Weights   10/09/21 1452  Weight: 59 kg    Examination:  General exam: Appears comfortable, not in any acute distress.  Deconditioned Respiratory system: Clear to auscultation bilaterally. Respiratory effort normal.  RR 15 Cardiovascular system: S1-S2 heard, regular rate and rhythm, no murmur. Gastrointestinal system: Abdomen is soft, nondistended, mildly tender, BS+ Central nervous system: Alert and oriented. No focal neurological deficits. Extremities: No edema, no cyanosis, no clubbing. Skin: No rashes, lesions or ulcers Psychiatry: Judgement and insight appear normal. Mood & affect appropriate.     Data Reviewed: I have personally reviewed following labs and imaging studies  CBC: Recent Labs  Lab 10/03/21 1104 10/08/21 1207 10/09/21 0523  WBC 12.4* 19.4* 8.0  NEUTROABS 9.9* 16.1*  --   HGB 9.9* 10.4* 8.7*  HCT 29.0* 31.8* 26.9*  MCV 84.5 90.1 93.1  PLT 158 265 916   Basic Metabolic Panel: Recent Labs  Lab 10/03/21 1104 10/08/21 1207 10/09/21 0523  NA 132* 135 137  K 2.6* 4.4 4.3  CL 95* 100 104  CO2 25 19* 20*  GLUCOSE  205* 236* 131*  BUN 14 15 15   CREATININE 0.90 1.01* 0.86  CALCIUM 8.6* 9.0 8.4*  MG 0.7* 1.2*  --    GFR: Estimated Creatinine Clearance: 45.4 mL/min (by C-G formula based on SCr of 0.86 mg/dL). Liver Function Tests: Recent Labs  Lab 10/08/21 1207  AST 11*  ALT 9  ALKPHOS 231*  BILITOT 0.6  PROT 6.1*  ALBUMIN 3.1*   No results for input(s): LIPASE, AMYLASE in the last 168 hours. No results for input(s): AMMONIA in the last 168 hours. Coagulation Profile: No results for input(s): INR, PROTIME in the last 168 hours. Cardiac Enzymes: No results for input(s): CKTOTAL, CKMB, CKMBINDEX, TROPONINI in the last 168 hours. BNP (last 3 results) No results for input(s): PROBNP in the last 8760 hours. HbA1C: No results for input(s): HGBA1C in the last 72 hours. CBG: Recent Labs  Lab 10/08/21 2144 10/09/21 0800 10/09/21 1153  GLUCAP 170* 97 148*   Lipid Profile: No results for input(s): CHOL, HDL, LDLCALC, TRIG, CHOLHDL, LDLDIRECT in the last 72 hours. Thyroid Function Tests: No results for input(s): TSH, T4TOTAL, FREET4,  T3FREE, THYROIDAB in the last 72 hours. Anemia Panel: No results for input(s): VITAMINB12, FOLATE, FERRITIN, TIBC, IRON, RETICCTPCT in the last 72 hours. Sepsis Labs: No results for input(s): PROCALCITON, LATICACIDVEN in the last 168 hours.  Recent Results (from the past 240 hour(s))  Culture, blood (routine x 2)     Status: None (Preliminary result)   Collection Time: 10/08/21  9:00 PM   Specimen: BLOOD RIGHT ARM  Result Value Ref Range Status   Specimen Description   Final    BLOOD RIGHT ARM Performed at [facility] [address], [place], [place] [address]    Special Requests   Final    BOTTLES DRAWN AEROBIC ONLY Blood Culture adequate volume Performed at [facility] [address] [place], [place] [address]    Culture   Final    NO GROWTH < 12 HOURS Performed at [facility] [address]  [place], [place] [address]    Report Status PENDING  Incomplete  Culture, blood (routine x 2)     Status: None (Preliminary result)   Collection Time: 10/09/21  5:25 AM   Specimen: BLOOD LEFT HAND  Result Value Ref Range Status   Specimen Description   Final    BLOOD LEFT HAND Performed at [facility] [address], [place], [place] [address]    Special Requests   Final    BOTTLES DRAWN AEROBIC ONLY Blood Culture adequate volume Performed at [facility] [address] [place], [place] [address]    Culture   Final    NO GROWTH < 12 HOURS Performed at [facility] [address] [place], [place] [address]    Report Status PENDING  Incomplete    Radiology Studies: DG CHEST PORT 1 VIEW  Result Date: 10/08/2021 CLINICAL DATA:  Increasing shortness of breath EXAM: PORTABLE CHEST 1 VIEW COMPARISON:  09/05/2021 FINDINGS: Cardiac shadow is within normal limits. Aortic calcifications are noted. Right chest wall port is noted in satisfactory position. Lungs are clear bilaterally. No bony abnormality is seen. The known right upper lobe pulmonary nodule on the right is not appreciated due to its small size. No bony abnormality is seen. IMPRESSION: No acute abnormality noted. Electronically Signed   By: Inez Catalina M.D.   On: 10/08/2021 19:33    Scheduled Meds:  aspirin EC  81 mg Oral Daily   atorvastatin  40 mg Oral q morning   cadexomer iodine   Topical BID   Chlorhexidine Gluconate Cloth  6 each Topical Daily   ferrous sulfate  325 mg Oral BID WC   gemfibrozil  600 mg Oral BID   heparin  5,000 Units Subcutaneous Q8H   insulin aspart  0-5 Units Subcutaneous QHS   insulin aspart  0-9 Units Subcutaneous TID WC   lactose free nutrition  237 mL Oral TID WC & HS   levothyroxine  125 mcg Oral Q0600   multivitamin with minerals  1 tablet Oral Daily   pantoprazole  40 mg Oral BID   Continuous Infusions:  sodium chloride 100 mL/hr at 10/09/21 0626    ceFEPime (MAXIPIME) IV 2 g (10/09/21 0849)   vancomycin       LOS: 1 day    Time spent: 35 mins    Nada Godley, MD [facility]   If 7PM-7AM, please contact night-coverage

## 2021-10-09 NOTE — Progress Notes (Signed)
Initial Nutrition Assessment  DOCUMENTATION CODES:  Non-severe (moderate) malnutrition in context of chronic illness  INTERVENTION:  Continue to advance diet as medically able and as tolerated.  Add Boost Plus po QID, each supplement provides 360 kcal and 14 grams of protein.  Add MVI with minerals daily.  Encourage PO and supplement intake.   Obtain updated weight.  NUTRITION DIAGNOSIS:  Moderate Malnutrition related to chronic illness, cancer and cancer related treatments as evidenced by mild fat depletion, moderate fat depletion, mild muscle depletion.  GOAL:  Patient will meet greater than or equal to 90% of their needs  MONITOR:  Diet advancement, PO intake, Supplement acceptance, Labs, Weight trends, I & O's  REASON FOR ASSESSMENT:  Malnutrition Screening Tool    ASSESSMENT:  75 yo female with a PMH of rectal cancer currently following with oncology( Dr [name]) currently on chemotherapy, status post diverting loop colostomy on 5/22 at [facility] , history of cervical cancer/uterine cancer, diabetes type 2, recurrent urine tract infections, history of CVA, who presented to the cancer center on unscheduled visit for the evaluation of weakness, nausea, vomiting and confusion.  She also complained of left-sided abdominal pain.  Daughter contacted the cancer center today with report of increased lethargy, shortness of breath with ambulation, nausea and vomiting.  She has been reported to be doing poorly for the last 2 weeks, had decreased oral intake.  She was also confused. Admitted with sepsis.  Spoke with pt and son at bedside. Pt reports that she did not receive breakfast, but she is ready to try something for lunch.  Son reports that pt has not been eating well since starting chemotherapy and has been losing a lot of weight.  Pt reports that she has been having taste changes and nothing tastes "right" anymore. She does drink Boost at home.  Per Epic, pt has lost ~16 lbs (11.1%)  in the last month, which is significant and severe for the time frame. Pt's weight appears to be trending down over the past 3-4 months.  Pt with no admission weight. RD to order.  Of note, pt with mild BLE edema.  RD to order Boost Plus QID and MVI with minerals daily.  Medications: reviewed; ferrous sulfate BID, SSI, Synthroid, Protonix BID, NaCl @ 100 ml/hr  Labs: reviewed; CBG 97-170 HbA1c: 7.7% (08/26/2021)  NUTRITION - FOCUSED PHYSICAL EXAM: Flowsheet Row Most Recent Value  Orbital Region Mild depletion  Upper Arm Region Mild depletion  Thoracic and Lumbar Region No depletion  Buccal Region Moderate depletion  Temple Region Mild depletion  Clavicle Bone Region Mild depletion  Clavicle and Acromion Bone Region Mild depletion  Scapular Bone Region No depletion  Dorsal Hand Mild depletion  Patellar Region No depletion  Anterior Thigh Region No depletion  Posterior Calf Region No depletion  Edema (RD Assessment) Mild  [BLE]  Hair Reviewed  Eyes Reviewed  Mouth Reviewed  Skin Reviewed  Nails Reviewed   Diet Order:   Diet Order             Diet full liquid Room service appropriate? Yes; Fluid consistency: Thin  Diet effective now                  EDUCATION NEEDS:  Education needs have been addressed  Skin:  Skin Assessment: Reviewed RN Assessment  Last BM:  10/08/21 - ostomy  Height:  Ht Readings from Last 1 Encounters:  10/08/21 5' (1.524 m)   Weight:  Wt Readings from Last 1 Encounters:  10/04/21 58.5 kg   BMI:  There is no height or weight on file to calculate BMI.  Estimated Nutritional Needs:  Kcal:  2100-2300 Protein:  80-95 grams Fluid:  >2.1 L  Derrel Nip, RD, LDN (she/her/hers) Clinical Inpatient Dietitian RD Pager/After-Hours/Weekend Pager # in [place]

## 2021-10-09 NOTE — Consult Note (Addendum)
Referring Provider: [facility] PCP: Everardo Beals, NP  Gastroenterologist: Thornton Park, MD Reason for consultation:    duodenal wall thickening on CTAP               ASSESSMENT / PLAN   # 75 yo female with  rectal and recurrent cervical adenocarcinoma followed locally at at [facility]. On FOLFOX. She is s/p TAH and diverting colostomy in May 2022.  Disease spread more extensive than appreciated on imaging. LAR was aborted. Additionally the cervical stump was fibrotic and couldn't be removed. Marland KitchenRestating CTAP on 11/15 >>  Unchanged findings of rectal malignancy status post treatment. Duodenal wall thickening  #  Nausea / vomiting x 1 week. CTAP with marked thickening of the proximal duodenal wall. Etiology of duodenitis unclear at this point. --We could proceed with EGD / biopsies but patient has been taking Plavix so will need to discuss with Dr. Lyndel Safe to see if he is willing to proceed vrs wait on a plavix washout.  Since she has been vomiting all PO intake ( including medications). She probably hasn't been absorbing much of the plavix anyway. The risks and benefits of EGD with possible biopsies were discussed with the patient who agrees to proceed.   # Heme positive anemia, multifactorial ( chronic illness, chemotherapy and probable GI bleeding). Received 3 uPRBC during admission for anemia earlier this month. After transfusion hgb improved from 5.9 to 9.9. Hgb down slightly now to 8.7 ( the 10.4 on admission was likely high from dehydration).     # Additional medical history listed below.   HISTORY OF PRESENT ILLNESS                                                                                                                         Chief Complaint: nausea / vomiting  [name] is a 75 y.o. female with a past medical history significant for DM2, CVA on chronic Plavix, kidney stones, HLD, hypothyroidism, GERD, esophagitis, IDA, and adenocarcinoma of the rectum see PMH  for any additional medical problems.    [name] has a remote history of cervical cancer treated with external beam radiation and brachytherapy at [facility]. We diagnosed her with adenocarcinoma of the rectum in March 2022.  Subsequent imaging concerning also for recurrent cervical cancer . In May 2022 she had a TAH and diverting loop colostomy.( Plan for LAR was aborted due o extensive spread of disease not appreciated on imaging).  The cervix was unresectable due to dense fibrosis.  She appears to have two synchronous malignancies ( rectal and cervical adenocarcinoma). She has been receiving  FOLFOX, completed cycle 5 then had restating at [facility].    CT scan w/ contrast at [facility] on 11/15   Impression:  1. Unchanged findings of rectal malignancy status post treatment.  2. Marked thickening of the proximal duodenal wall. Findings may represent  duodenitis; however, malignancy not excluded. Consider correlation with  endoscopy.  3. Unchanged bilateral inguinal lymph nodes which  are not enlarged, but  avid on prior PET  4. Unchanged diffuse bladder wall thickening and perivesicular straining,  which may represent radiation cystitis. Correlate with urinalysis.  5. New mild right lower lobe bronchial wall thickening, mucus impaction,  and tree-in-bud pulmonary nodules which may represent infection or  aspiration.   [name] was admitted earlier this month with symptomatic anemia felt to be multifactorial ( chronic disease, chemo use, and rectal tumor) She was hemoccult positive. She was transfused 3 uPRBC. Hgb improved from 5.9 to 7.7. She has since been started on oral iron    Yesterday she was seen by Oncology (unscheduled visit) for evaluation of the lethargy and shortness of breath.   [name] has been doing poorly. She has no appetite. Over the last week she has been unable to tolerate PO. No upper abdominal pain. No hematemesis.Takes a daily baby ASA, no other NSAIDS.  She has had some dark stools  since starting iron. She endorses intermittent LLQ discomfort over the last months. The  main has no relation to eating or physical activity. Other than color change she says her ostomy output is unchanged.  Labs yesterday:  WBC of 19.4, hgb 10.4, mildly elevated creatinine of 1.01 ( up from 0.9), alk phos 231, tbili 0.6, AST 11, ALT 9.   Today her WBC is normal at 8, hgb down to 8.7. No vomiting today but she hasn't eaten anything    IMAGING:  DG CHEST PORT 1 VIEW  Result Date: 10/08/2021 CLINICAL DATA:  Increasing shortness of breath EXAM: PORTABLE CHEST 1 VIEW COMPARISON:  09/05/2021 FINDINGS: Cardiac shadow is within normal limits. Aortic calcifications are noted. Right chest wall port is noted in satisfactory position. Lungs are clear bilaterally. No bony abnormality is seen. The known right upper lobe pulmonary nodule on the right is not appreciated due to its small size. No bony abnormality is seen. IMPRESSION: No acute abnormality noted. Electronically Signed   By: Inez Catalina M.D.   On: 10/08/2021 19:33     PREVIOUS ENDOSCOPIC EVALUATIONS  / IMAGING STUDIES   EGD February 22 for evaluation of abdominal pain, diarrhea, N/V.   --Z-line slightly nodular, gastritis, friable gastric body.  Exam otherwise normal  Diagnosis 1. Surgical [P], duodenal - DUODENAL MUCOSA WITH NO SIGNIFICANT PATHOLOGIC FINDINGS. - NEGATIVE FOR INCREASED INTRAEPITHELIAL LYMPHOCYTES AND VILLOUS ARCHITECTURAL CHANGES. 2. Surgical [P], fundus, gastric antrum and gastric body - GASTRIC ANTRAL AND OXYNTIC MUCOSA WITH MILD CHRONIC GASTRITIS AND REACTIVE CHANGES. - WARTHIN-STARRY STAIN IS NEGATIVE FOR HELICOBACTER PYLORI. 3. Surgical [P], distal esophagus - SQUAMOCOLUMNAR ESOPHAGEAL MUCOSA WITH REACTIVE/REGENERATIVE CHANGES. - NEGATIVE FOR INTESTINAL METAPLASIA (GOBLET CELL METAPLASIA). - NEGATIVE FOR INCREASED INTRAEPITHELIAL EOSINOPHILS.  March 2022 colonoscopy for evaluation of IDA, hematochezia, lower  abdominal pain A malignant-appearing, intrinsic severe stenosis measuring 5 mm (inner diameter) was found in the rectosigmoid colon and was non-traversed. Biopsies were taken with a cold forceps for histology. An infiltrative and ulcerated partially obstructing large mass was found in the recto-sigmoid colon, 10cm from anal verge. The mass was circumferential. Oozing was present. Biopsies were taken with a cold forceps for histology. Distal fold area was tattooed with an injection of 1 mL of Spot (carbon black). Retroflexion could not be performed due to narrow rectal vault and decreased rectal compliance, suspicious for infiltrative process involving rectum - Stricture in the recto-sigmoid colon. Biopsied  FINAL MICROSCOPIC DIAGNOSIS:   A. RECTUM, BIOPSY:  -  Adenocarcinoma arising in a background of high-grade dysplasia  -  See comment    Past Medical History:  Diagnosis Date   Arthritis    Basal ganglia stroke ([place])    Cancer ([place])    Carotid artery occlusion    History of cervical cancer    11/ 1994  Stage IB  s/p  high dose radiation brachytherapy @ [facility] 01/ 1995--- per pt no recurrence   History of chronic bronchitis    History of hyperthyroidism    d 03/ 2011---due to grave's disease--- s/p  RAI treatment 04/ 2011   History of kidney stones    History of sepsis 05/03/2018   due to UTI with klebsiella/ pyelonephritis/ ureteral obstruction cause by stone   Hyperlipidemia    Hypothyroidism, postradioiodine therapy    endocrinologist--  dr Loanne Drilling--  dx graves disease and s/p RAI i131 treatement 4/ 2011   Oral thrush 05/03/2018   Pneumonia    Type 2 diabetes mellitus treated with insulin ([place])    FOLLOWED BY PCP    Past Surgical History:  Procedure Laterality Date   APPENDECTOMY     BIOPSY  01/23/2021   Procedure: BIOPSY;  Surgeon: Mauri Pole, MD;  Location: [facility];  Service: Endoscopy;;   CAROTID ENDARTERECTOMY Right 03/28/2020   CATARACT EXTRACTION W/  INTRAOCULAR LENS  IMPLANT, BILATERAL  2004  approx.   COLONOSCOPY WITH PROPOFOL N/A 01/23/2021   Procedure: COLONOSCOPY WITH PROPOFOL;  Surgeon: Mauri Pole, MD;  Location: [facility];  Service: Endoscopy;  Laterality: N/A;   CYSTOSCOPY WITH STENT PLACEMENT Right 05/04/2018   Procedure: CYSTOSCOPY WITH STENT PLACEMENT AND RETROGRADE PYELOGRAM;  Surgeon: Ceasar Mons, MD;  Location: [facility];  Service: Urology;  Laterality: Right;   CYSTOSCOPY/URETEROSCOPY/HOLMIUM LASER/STENT PLACEMENT Right 05/19/2018   Procedure: CYSTOSCOPY, URETEROSCOPY/HOLMIUM LASER, STONE BASKETRY/ STENT EXCHANGE;  Surgeon: Ceasar Mons, MD;  Location: [facility];  Service: Urology;  Laterality: Right;   ENDARTERECTOMY Right 03/28/2020   Procedure: RIGHT CAROTID ENDARTERECTOMY with PATCH ANGIOPLASTY;  Surgeon: Rosetta Posner, MD;  Location: [facility] OR;  Service: Vascular;  Laterality: Right;   EXCISION MASS LEFT CHEST WALL  09-20-2007   dr Grandville Silos  [facility]   Radioactive Iodine Therapy     for thyroid   REVISION TOTAL HIP ARTHROPLASTY Left early 2000s   SUBMUCOSAL TATTOO INJECTION  01/23/2021   Procedure: SUBMUCOSAL TATTOO INJECTION;  Surgeon: Mauri Pole, MD;  Location: [facility];  Service: Endoscopy;;   TANDEM RING INSERTION  1995   dr Aldean Ast @ [facility]   EUA w/ tandem placement in ovid  (for direct high dose radiation brachytherapy , cervical cancer)   TOTAL HIP ARTHROPLASTY Left 1990s   TRANSTHORACIC ECHOCARDIOGRAM  08/07/2017   mild focal basal hypertrophy of the septum,  ef 62-69%, grade 1 diastolic dysfunction/  atrial septum with lipomatous hypertrophy/  trivial PR    Prior to Admission medications   Medication Sig Start Date End Date Taking? Authorizing Provider  acetaminophen (TYLENOL) 500 MG tablet Take 500 mg by mouth daily as needed for headache (pain).    [provider]  ALENDRONATE SODIUM PO     [provider]  ALPRAZOLAM PO     [provider]  aspirin 81 MG EC tablet Take 1 tablet (81 mg total) by mouth daily. RESUME AFTER 1 WEEK Patient taking differently: Take 81 mg by mouth every morning. 09/03/21   Bonnielee Haff, MD  atorvastatin (LIPITOR) 40 MG tablet Take 40 mg by mouth every morning. 12/15/19   [provider]  baclofen (LIORESAL) 10 MG tablet Take 10 mg by mouth 2 (two) times daily. 03/22/21   [provider]  cephALEXin (KEFLEX) 250 MG capsule Take 1 capsule (250 mg total) by mouth daily. RESUME AFTER YOU HAVE COMPLETED THE COURSE OF CIPRO Patient taking differently: Take 250 mg by mouth every morning. 08/28/21   Bonnielee Haff, MD  clopidogrel (PLAVIX) 75 MG tablet Take 1 tablet (75 mg total) by mouth daily. RESUME AFTER 1 WEEK ON 09/03/21 Patient taking differently: Take 75 mg by mouth at bedtime. 09/03/21   Bonnielee Haff, MD  docusate sodium (COLACE) 100 MG capsule Take 100 mg by mouth 2 (two) times daily.    [provider]  Ergocalciferol (VITAMIN D2 PO)     [provider]  Fenofibrate (TRICOR PO)     [provider]  ferrous sulfate 325 (65 FE) MG tablet Take 1 tablet (325 mg total) by mouth 2 (two) times daily with a meal. 08/28/21 11/26/21  Bonnielee Haff, MD  gabapentin (NEURONTIN) 300 MG capsule Take 300-600 mg by mouth See admin instructions. Take one capsule (300 mg) by mouth every morning and two capsules (600 mg) at night    [provider]  gemfibrozil (LOPID) 600 MG tablet Take 600 mg by mouth 2 (two) times daily.    [provider]  GLYBURIDE-METFORMIN PO     [provider]  insulin degludec (TRESIBA FLEXTOUCH) 100 UNIT/ML FlexTouch Pen Inject 10 Units into the skin daily. Patient taking differently: Inject 20 Units into the skin daily as needed (CBG >200). 08/28/21   Bonnielee Haff, MD  Lancets [facility] DELICA PLUS [id number]) MISC Apply topically 2 (two) times daily. 12/02/20   [provider]  levothyroxine  (SYNTHROID) 125 MCG tablet Take 1 tablet (125 mcg total) by mouth daily at 6 (six) AM. 08/29/21 11/27/21  Bonnielee Haff, MD  lidocaine-prilocaine (EMLA) cream Apply to port site 1-2 hours prior to use Patient not taking: Reported on 09/23/2021 07/08/21   Owens Shark, NP  metFORMIN (GLUCOPHAGE) 500 MG tablet Take 500 mg by mouth 2 (two) times daily. 02/01/21   [provider]  nystatin (MYCOSTATIN/NYSTOP) powder Apply 1 application topically 2 (two) times daily. To groin rash Patient taking differently: Apply 1 application topically 2 (two) times daily as needed (groin rash). 07/29/21   Ladell Pier, MD  nystatin cream (MYCOSTATIN) Apply 1 application topically 2 (two) times daily as needed (groin rash). Mix with triamcinolone cream    [provider]  ONETOUCH VERIO test strip 1 each 2 (two) times daily. 11/29/20   [provider]  oxyCODONE ER (XTAMPZA ER) 9 MG C12A Take 9 mg by mouth every 12 (twelve) hours.    [provider]  pantoprazole (PROTONIX) 20 MG tablet Take by mouth. 10/01/21 10/01/22  [provider]  pantoprazole (PROTONIX) 20 MG tablet Take 20 mg by mouth daily. 10/01/21   [provider]  polyethylene glycol powder (GLYCOLAX/MIRALAX) 17 GM/SCOOP powder Take 17 g by mouth 2 (two) times daily as needed (constipation).    [provider]  potassium chloride SA (KLOR-CON) 20 MEQ tablet Take 1 tablet (20 mEq total) by mouth 2 (two) times daily. 10/03/21   Ladell Pier, MD  senna-docusate (SENOKOT-S) 8.6-50 MG tablet Take 1 tablet by mouth 2 (two) times daily. Patient not taking: No sig reported 08/28/21 11/26/21  Bonnielee Haff, MD  triamcinolone cream (KENALOG) 0.1 % Apply 1 application topically 2 (two) times daily as needed (groin rash).  Mix with nystatin cream 09/19/20   [provider]    Current Facility-Administered Medications  Medication Dose Route Frequency Provider Last Rate Last Admin   0.9 %   sodium chloride infusion   Intravenous Continuous Shelly Coss, MD 100 mL/hr at 10/09/21 0626 New Bag at 10/09/21 4665   aspirin EC tablet 81 mg  81 mg Oral Daily Shelly Coss, MD   81 mg at 10/09/21 0846   atorvastatin (LIPITOR) tablet 40 mg  40 mg Oral q morning Shelly Coss, MD   40 mg at 10/09/21 0853   ceFEPIme (MAXIPIME) 2 g in sodium chloride 0.9 % 100 mL IVPB  2 g Intravenous BID Shelly Coss, MD 200 mL/hr at 10/09/21 0849 2 g at 10/09/21 0849   Chlorhexidine Gluconate Cloth 2 % PADS 6 each  6 each Topical Daily Shawna Clamp, MD   6 each at 10/09/21 0847   clopidogrel (PLAVIX) tablet 75 mg  75 mg Oral Daily Shelly Coss, MD   75 mg at 10/09/21 0846   ferrous sulfate tablet 325 mg  325 mg Oral BID WC Shelly Coss, MD   325 mg at 10/09/21 0847   gemfibrozil (LOPID) tablet 600 mg  600 mg Oral BID Shelly Coss, MD       heparin injection 5,000 Units  5,000 Units Subcutaneous Q8H Shelly Coss, MD   5,000 Units at 10/09/21 0528   insulin aspart (novoLOG) injection 0-5 Units  0-5 Units Subcutaneous QHS Adhikari, Amrit, MD       insulin aspart (novoLOG) injection 0-9 Units  0-9 Units Subcutaneous TID WC Adhikari, Amrit, MD       levothyroxine (SYNTHROID) tablet 125 mcg  125 mcg Oral [id number] Shelly Coss, MD   125 mcg at 10/09/21 0528   morphine 2 MG/ML injection 2 mg  2 mg Intravenous Q4H PRN Shelly Coss, MD       ondansetron (ZOFRAN) injection 4 mg  4 mg Intravenous Q6H PRN Adhikari, Amrit, MD       oxyCODONE (Oxy IR/ROXICODONE) immediate release tablet 5 mg  5 mg Oral Q6H PRN Adhikari, Amrit, MD       pantoprazole (PROTONIX) EC tablet 40 mg  40 mg Oral BID Shelly Coss, MD   40 mg at 10/09/21 0847   polyethylene glycol (MIRALAX / GLYCOLAX) packet 17 g  17 g Oral BID PRN Shelly Coss, MD       vancomycin (VANCOREADY) IVPB 750 mg/150 mL  750 mg Intravenous Q24H Shelly Coss, MD        Allergies as of 10/08/2021 - Review Complete 10/08/2021  Allergen  Reaction Noted   Codeine Nausea Only 10/19/2008    Family History  Problem Relation Age of Onset   Emphysema Father    Diabetes Father    Emphysema Sister    Emphysema Brother    Cancer Mother        Skin Cancer   Diabetes Mother    Colon cancer Neg Hx    Esophageal cancer Neg Hx    Rectal cancer Neg Hx    Stomach cancer Neg Hx     Social History   Socioeconomic History   Marital status: Married    Spouse name: Not on file   Number of children: 2   Years of education: Not on file   Highest education level: Not on file  Occupational History   Occupation: Disabled  Tobacco Use   Smoking status: Former    Years: 5.00    Types: Cigarettes  Quit date: 05/13/1982    Years since quitting: 39.4   Smokeless tobacco: Never  Vaping Use   Vaping Use: Never used  Substance and Sexual Activity   Alcohol use: No   Drug use: No   Sexual activity: Not Currently  Other Topics Concern   Not on file  Social History Narrative   ** Merged History Encounter **       Married and lives with husband Has 2 children  Housewife Disabled 2-3 cups of caffeine  R handed    Social Determinants of Health   Financial Resource Strain: Not on file  Food Insecurity: Not on file  Transportation Needs: Not on file  Physical Activity: Not on file  Stress: Not on file  Social Connections: Not on file  Intimate Partner Violence: Not on file   Review of Systems: All systems reviewed and negative except where noted in HPI.   OBJECTIVE    Physical Exam: Vital signs in last 24 hours: Temp:  [97.4 F (36.3 C)-97.8 F (36.6 C)] 97.4 F (36.3 C) (11/23 0440) Pulse Rate:  [58-120] 58 (11/23 0458) Resp:  [16-20] 16 (11/23 0440) BP: (110-152)/(43-83) 123/49 (11/23 0458) SpO2:  [97 %-100 %] 100 % (11/23 0440) Last BM Date: 10/08/21  General:  Alert female in NAD Psych:  Pleasant, cooperative. Normal mood and affect Eyes: Pupils equal, no icterus. Conjunctive pink Ears:  Normal  auditory acuity Nose: No deformity, discharge or lesions Neck:  Supple, no masses felt Lungs:  Clear to auscultation.  Heart:  Regular rate, regular rhythm. No lower extremity edema Abdomen:  Soft, nondistended, nontender, active bowel sounds, no masses felt. Colostomy with brown partially formed stool in ostomy bag. Mild LLQ tenderness.  Rectal :  Deferred Msk: Symmetrical without gross deformities.  Neurologic:  Alert, oriented, grossly normal neurologically Skin:  Intact without significant lesions.    Scheduled inpatient medications  aspirin EC  81 mg Oral Daily   atorvastatin  40 mg Oral q morning   Chlorhexidine Gluconate Cloth  6 each Topical Daily   clopidogrel  75 mg Oral Daily   ferrous sulfate  325 mg Oral BID WC   gemfibrozil  600 mg Oral BID   heparin  5,000 Units Subcutaneous Q8H   insulin aspart  0-5 Units Subcutaneous QHS   insulin aspart  0-9 Units Subcutaneous TID WC   levothyroxine  125 mcg Oral Q0600   pantoprazole  40 mg Oral BID      Intake/Output from previous day: 11/22 0701 - 11/23 0700 In: 1021.6 [I.V.:871.7; IV Piggyback:150] Out: 100 [Urine:100] Intake/Output this shift: No intake/output data recorded.   Lab Results: Recent Labs    10/08/21 1207 10/09/21 0523  WBC 19.4* 8.0  HGB 10.4* 8.7*  HCT 31.8* 26.9*  PLT 265 185   BMET Recent Labs    10/08/21 1207 10/09/21 0523  NA 135 137  K 4.4 4.3  CL 100 104  CO2 19* 20*  GLUCOSE 236* 131*  BUN 15 15  CREATININE 1.01* 0.86  CALCIUM 9.0 8.4*   LFTs Recent Labs    10/08/21 1207  PROT 6.1*  ALBUMIN 3.1*  AST 11*  ALT 9  ALKPHOS 231*  BILITOT 0.6   PT/INR No results for input(s): LABPROT, INR in the last 72 hours. Hepatitis Panel No results for input(s): HEPBSAG, HCVAB, HEPAIGM, HEPBIGM in the last 72 hours.   . CBC Latest Ref Rng & Units 10/09/2021 10/08/2021 10/03/2021  WBC 4.0 - 10.5 K/uL 8.0 19.4(H) 12.4(H)  Hemoglobin 12.0 - 15.0 g/dL 8.7(L) 10.4(L) 9.9(L)   Hematocrit 36.0 - 46.0 % 26.9(L) 31.8(L) 29.0(L)  Platelets 150 - 400 K/uL 185 265 158    . CMP Latest Ref Rng & Units 10/09/2021 10/08/2021 10/03/2021  Glucose 70 - 99 mg/dL 131(H) 236(H) 205(H)  BUN 8 - 23 mg/dL _0 Creatinine 0.44 - 1.00 mg/dL 0.86 1.01(H) 0.90  Sodium 135 - 145 mmol/L 137 135 132(L)  Potassium 3.5 - 5.1 mmol/L 4.3 4.4 2.6(LL)  Chloride 98 - 111 mmol/L 104 100 95(L)  CO2 22 - 32 mmol/L 20(L) 19(L) 25  Calcium 8.9 - 10.3 mg/dL 8.4(L) 9.0 8.6(L)  Total Protein 6.5 - 8.1 g/dL - 6.1(L) -  Total Bilirubin 0.3 - 1.2 mg/dL - 0.6 -  Alkaline Phos 38 - 126 U/L - 231(H) -  AST 15 - 41 U/L - 11(L) -  ALT 0 - 44 U/L - 9 -     Principal Problem:   Sepsis ([place]) Active Problems:   Hypothyroidism   Type 2 diabetes mellitus ([facility])   Hyperlipidemia associated with type 2 diabetes mellitus ([place])   History of cervical cancer   Hypomagnesemia   Normocytic anemia   Iron deficiency anemia due to chronic blood loss   Rectal mass   AKI (acute kidney injury) ([place])    Tye Savoy, NP-C @  10/09/2021, 8:59 AM   Attending physician's note   I have taken an interval history, reviewed the chart and examined the patient. I agree with the Advanced Practitioner's note, impression and recommendations.   N/V likely d/t oxycodone (resolved). Tolerating PO without any problems. CT showing duodenal wall thickening. ?etiology Locally advanced inoperable rectal AdenoCa with LN mets. Aborted LAR d/t extensive invasion Also with cervical AdenoCa (as a separate malignancy) CVA on plavix (last dose 11/22)  Plan: -Since N/V has completely resolved, she would like to hold off on EGD -Advance diet -Resume Plavix. -Increase Protonix 40 mg p.o. QD. -Okay to D/C from GI standpoint -If any recurrent N/V in future, then she is willing to get EGD done. -D/W extensively with pt and pt's son. -Will sign off for now   Carmell Austria, MD [facility] [phone]

## 2021-10-09 NOTE — Progress Notes (Signed)
IP PROGRESS NOTE  Subjective:   [name] has no complaint this morning.  No nausea.  Objective: Vital signs in last 24 hours: Blood pressure (!) 123/49, pulse (!) 58, temperature (!) 97.4 F (36.3 C), temperature source Oral, resp. rate 16, SpO2 100 %.  Intake/Output from previous day: 11/22 0701 - 11/23 0700 In: 1021.6 [I.V.:871.7; IV Piggyback:150] Out: 100 [Urine:100]  Physical Exam:  HEENT: No thrush Lungs: Mild bilateral expiratory wheeze, no respiratory distress Cardiac: Regular rate and rhythm Abdomen: No hepatomegaly, nontender, left lower quadrant colostomy with brown stool Extremities: No leg edema Skin: Bandages in place at the right foot ulcers Neurologic: Alert and oriented, follows commands  Portacath/PICC-without erythema  Lab Results: Recent Labs    10/08/21 1207 10/09/21 0523  WBC 19.4* 8.0  HGB 10.4* 8.7*  HCT 31.8* 26.9*  PLT 265 185    BMET Recent Labs    10/08/21 1207 10/09/21 0523  NA 135 137  K 4.4 4.3  CL 100 104  CO2 19* 20*  GLUCOSE 236* 131*  BUN 15 15  CREATININE 1.01* 0.86  CALCIUM 9.0 8.4*    Lab Results  Component Value Date   CEA1 28.60 (H) 06/12/2021   CEA 9.96 (H) 09/02/2021    Studies/Results: DG CHEST PORT 1 VIEW  Result Date: 10/08/2021 CLINICAL DATA:  Increasing shortness of breath EXAM: PORTABLE CHEST 1 VIEW COMPARISON:  09/05/2021 FINDINGS: Cardiac shadow is within normal limits. Aortic calcifications are noted. Right chest wall port is noted in satisfactory position. Lungs are clear bilaterally. No bony abnormality is seen. The known right upper lobe pulmonary nodule on the right is not appreciated due to its small size. No bony abnormality is seen. IMPRESSION: No acute abnormality noted. Electronically Signed   By: Inez Catalina M.D.   On: 10/08/2021 19:33    Medications: I have reviewed the patient's current medications.  Assessment/Plan: Rectal cancer Partially obstructing mass beginning at 10 cm from  the anal verge on colonoscopy 01/23/2021, biopsy-adenocarcinoma, immunohistochemical stains at [facility]-CK20, CDX-2, and SATB2 positive, patchy strong staining for p16.  The rectal tumor has a distinct immunohistochemical profile suggesting a primary colorectal tumor as opposed to metastatic cervical cancer. CT abdomen/pelvis 01/18/2021-bilateral hydronephrosis, endometrial fluid collection, prominent stool in the colon MRI pelvis 01/23/2021-tumor at 7.6 cm from the anal verge, abnormal signal bridge in the cervix, mesorectum, and anterior aspect of the rectum-similar to MRI from 2020, MRI stage could not be defined secondary to posttreatment changes in the pelvis 03/18/2021-total abdominal hysterectomy, cystoscopy with insertion of ureteral stents, exploratory laparotomy, aborted low anterior resection, descending loop colostomy--right and left fallopian tubes and ovaries negative for malignancy; excision portion of cervix positive for adenocarcinoma; uterus with invasive moderately differentiated adenocarcinoma of the cervix, HPV associated, invading through full-thickness of the cervix/lower uterine segment and into parametrial tissue, margins of resection received disrupted, cannot be evaluated; remaining uterus with extensive endocervical adenocarcinoma in situ involving lower uterine segment and replacing endometrium.  This carcinoma was felt to be a separate primary from the rectal tumor. PET [facility] at [facility] 04/11/2021-intense FDG activity in region of known rectal mass.  Hypermetabolic left external iliac and right inguinal lymph nodes.  Minimal FDG uptake and small right supraclavicular lymph node.  No uptake seen in the cervix. 04/23/2021 FNA right inguinal lymph node-negative for malignancy Evaluated by Dr. Leamon Arnt 05/21/2021, 05/28/2021-appears to have 2 synchronous malignancies (cervical adenocarcinoma and rectal adenocarcinoma); further surgery which would entail pelvic exenteration not recommended by gynecologic  oncology or colorectal  surgery.  Full doses of radiotherapy not felt to be feasible.  Recommended 3 months of CAPOX and then repeat MRI of abdomen/pelvis, chest CT and colonoscopy. Cycle 1 Xeloda 06/13/2021 Cycle 1 FOLFOX 07/08/2021, oxaliplatin dose reduced secondary to pre-existing neuropathy Cycle 2 FOLFOX 07/29/2021, Udenyca Cycle 3 FOLFOX 08/12/2021, Udenyca Cycle 4 FOLFOX 09/02/2021, Udenyca Cycle 5 FOLFOX 09/17/2021, Udenyca CTs at [facility] 10/01/2021-unchanged findings of rectal malignancy status posttreatment.  Marked thickening of the proximal duodenal wall.  Unchanged bilateral inguinal lymph nodes which are not enlarged but avid on prior PET.  Unchanged diffuse bladder wall thickening and perivesicular stranding which may represent radiation cystitis.  New mild right lower lobe bronchial wall thickening, mucus impaction and tree-in-bud pulmonary nodules.     Bilateral hydronephrosis-secondary to urinary retention? Hematometra found on pelvic MRI 01/23/2021, evaluated by gynecologic oncology Cervical cancer November 1994, stage Ib, treated with external beam radiation and brachytherapy at [facility] Diabetes Neuropathy Chronic pain secondary to #6 Recurrent urinary tract infections History of a CVA History of hyperthyroidism G3, P3 COVID-19 infection January 2021 Admission 08/25/2021 with a urinary tract infection-culture negative Anemia secondary to chronic disease, chemotherapy, and rectal bleeding-1 unit packed red blood cells 08/26/2021 Hospital admission 09/23/2021-symptomatic anemia 16.  Admission 10/08/2021 with altered mental status, nausea/vomiting, and left foot ulcers   [name] was admitted yesterday with altered mental status and nausea/vomiting.  Nausea has resolved.  Her mental status appears to be at baseline.  The etiology of her symptoms yesterday is unclear.  She may have been developing an infection.  It is possible the altered mental status was related to xtampza.  The drop  in the white count and hemoglobin today likely reflects dehydration on admission.   I communicated with Dr. Leamon Arnt last night.  He indicates the [facility] multidisciplinary team does not recommend surgery for the cervical/rectal cancer.  Recommendations: Agree with holding xtampza-this was prescribed by her pain management physician for neuropathy pain Continue antibiotics (also on chronic suppressive therapy for UTIs) follow-up cultures She is stable for discharge from an oncology standpoint if her mental status remains clear Please call oncology as needed.  Outpatient follow-up is scheduled at the cancer center 10/14/2021   LOS: 1 day   Betsy Coder, MD   10/09/2021, 10:04 AM

## 2021-10-10 DIAGNOSIS — A419 Sepsis, unspecified organism: Secondary | ICD-10-CM | POA: Diagnosis not present

## 2021-10-10 DIAGNOSIS — R6520 Severe sepsis without septic shock: Secondary | ICD-10-CM | POA: Diagnosis not present

## 2021-10-10 DIAGNOSIS — N179 Acute kidney failure, unspecified: Secondary | ICD-10-CM | POA: Diagnosis not present

## 2021-10-10 LAB — BASIC METABOLIC PANEL
Anion gap: 7 (ref 5–15)
BUN: 12 mg/dL (ref 8–23)
CO2: 20 mmol/L — ABNORMAL LOW (ref 22–32)
Calcium: 7.7 mg/dL — ABNORMAL LOW (ref 8.9–10.3)
Chloride: 111 mmol/L (ref 98–111)
Creatinine, Ser: 0.88 mg/dL (ref 0.44–1.00)
GFR, Estimated: >60 mL/min (ref >60)
Glucose, Bld: 120 mg/dL — ABNORMAL HIGH (ref 70–99)
Potassium: 3.3 mmol/L — ABNORMAL LOW (ref 3.5–5.1)
Sodium: 138 mmol/L (ref 135–145)

## 2021-10-10 LAB — CBC
HCT: 23.2 % — ABNORMAL LOW (ref 36.0–46.0)
Hemoglobin: 7.6 g/dL — ABNORMAL LOW (ref 12.0–15.0)
MCH: 30.2 pg (ref 26.0–34.0)
MCHC: 32.8 g/dL (ref 30.0–36.0)
MCV: 92.1 fL (ref 80.0–100.0)
Platelets: 145 10*3/uL — ABNORMAL LOW (ref 150–400)
RBC: 2.52 MIL/uL — ABNORMAL LOW (ref 3.87–5.11)
RDW: 21.4 % — ABNORMAL HIGH (ref 11.5–15.5)
WBC: 6.8 10*3/uL (ref 4.0–10.5)
nRBC: 0 % (ref 0.0–0.2)

## 2021-10-10 LAB — HEMOGLOBIN AND HEMATOCRIT, BLOOD
HCT: 23.7 % — ABNORMAL LOW (ref 36.0–46.0)
Hemoglobin: 7.5 g/dL — ABNORMAL LOW (ref 12.0–15.0)

## 2021-10-10 LAB — PHOSPHORUS: Phosphorus: 2.6 mg/dL (ref 2.5–4.6)

## 2021-10-10 LAB — MAGNESIUM: Magnesium: 1.5 mg/dL — ABNORMAL LOW (ref 1.7–2.4)

## 2021-10-10 LAB — GLUCOSE, CAPILLARY
Glucose-Capillary: 92 mg/dL (ref 70–99)
Glucose-Capillary: 171 mg/dL — ABNORMAL HIGH (ref 70–99)
Glucose-Capillary: 156 mg/dL — ABNORMAL HIGH (ref 70–99)
Glucose-Capillary: 102 mg/dL — ABNORMAL HIGH (ref 70–99)

## 2021-10-10 MED ORDER — MAGNESIUM SULFATE 2 GM/50ML IV SOLN
2.0000 g | Freq: Once | INTRAVENOUS | Status: AC
  Administered 2021-10-10: 2 g via INTRAVENOUS
  Filled 2021-10-10: qty 50

## 2021-10-10 MED ORDER — POTASSIUM CHLORIDE 20 MEQ PO PACK
40.0000 meq | PACK | Freq: Once | ORAL | Status: DC
  Filled 2021-10-10: qty 2

## 2021-10-10 MED ORDER — ACETAMINOPHEN 500 MG PO TABS
500.0000 mg | ORAL_TABLET | Freq: Once | ORAL | Status: AC
  Administered 2021-10-10: 500 mg via ORAL
  Filled 2021-10-10: qty 1

## 2021-10-10 NOTE — Progress Notes (Signed)
PROGRESS NOTE    JAME SEELIG  FWY:[id number] DOB: 09/28/1946 DOA: 10/08/2021 PCP: Everardo Beals, NP   Brief Narrative: This 75 years old female with PMH significant of rectal cancer currently following with Dr. Benay Spice, on chemotherapy, s/p diverting loop colostomy on 5/22 at [facility],   history of cervical cancer/uterine cancer, diabetes type 2, recurrent urine tract infections, history of CVA, who presented to the cancer center on unscheduled visit for the evaluation of weakness, nausea, vomiting and confusion. She completed her cycle 5 FOLFOX on 09/17/2021 and she follows with oncology there but also follows at [facility] because she got colostomy there, she was last seen on 10/01/2021 at [facility]. She was admitted here on 09/23/2018 and was discharged on 09/22/2021 after she presented with symptomatic anemia with hemoglobin of 5.8, got blood transfusion with 3 units.  She is found to have leukocytosis. Patient is admitted for possible sepsis, dehydration and started on broad-spectrum antibiotics.  Oncology and GI is consulted.   Assessment & Plan:   Principal Problem:   Sepsis ([place]) Active Problems:   Hypothyroidism   Type 2 diabetes mellitus ([place])   Hyperlipidemia associated with type 2 diabetes mellitus ([place])   History of cervical cancer   Hypomagnesemia   Normocytic anemia   Iron deficiency anemia due to chronic blood loss   Rectal mass   AKI (acute kidney injury) ([place])   Malnutrition of moderate degree  Suspected sepsis likely due to UTI: Patient presented with leukocytosis, lethargy, tachycardia.   She does have a history of recurrent UTI.  Source of sepsis likely UTI Initiated on broad-spectrum empiric antibiotics Vanco and cefepime.  De-escalate antibiotics to cefepime.  Discontinue vancomycin Blood cultures : NGTD, urine culture grew Pseudomonas.  Sensitivity pending  AKI/dehydration: Resolved with IV hydration.  Nausea /Vomiting: This could be secondary to suspected  sepsis. CT scan done at [facility] showed unchanged findings of rectal malignancy status posttreatment.   Marked thickening of proximal duodenal wall. GI is consulted, states nausea and vomiting has resolved.  Patient doesn't want  EGD.   Continue Zofran as needed for nausea vomiting Patient is cleared from GI to be discharged.  History of  rectal adeno carcinoma / Cervical adenocarcinoma; Patient follows up with oncology at [facility] and [place]. Not a surgical candidate for cervical adenocarcinoma as per [facility]. Patient complains of vague abdominal discomfort.   CT scanning done a week ago showed unchanged findings of rectal malignancy. If she continues to have abdominal pain,  will consider repeat CT abdomen here. Patient reports abdominal pain has resolved.  Hyperlipidemia: Continue gemfibrozil and Lipitor  Iron deficiency anemia: Could be secondary to malignancy, rectal cancer causing chronic blood loss.  Continue iron supplementation  Type 2 diabetes: Continue sliding scale.  Hypothyroidism: Continue levothyroxine  History of CVA Continue aspirin and Plavix  Hypomagnesemia /hypokalemia: Replaced . continue to monitor  Left foot wound: Patient does have a callus-like lesion on the left foot which was resected by podiatry recently.   Wound does not look infected.    DVT prophylaxis: Heparin subcu Code Status: Full Code Family Communication: No family at bedside Disposition Plan:   Status is: Inpatient  Remains inpatient appropriate because:  Nausea and vomiting has improved but urine culture grew Pseudomonas sensitivity pending. Anticipated discharge home tomorrow.  Consultants:  GI Oncology  Procedures: None Antimicrobials:   Anti-infectives (From admission, onward)    Start     Dose/Rate Route Frequency Ordered Stop   10/09/21 2000  vancomycin (VANCOREADY) IVPB 750 mg/150  mL  Status:  Discontinued        750 mg 150 mL/hr over 60 Minutes Intravenous Every 24 hours  10/08/21 1827 10/09/21 1611   10/08/21 1930  vancomycin (VANCOREADY) IVPB 1250 mg/250 mL        1,250 mg 166.7 mL/hr over 90 Minutes Intravenous  Once 10/08/21 1827 10/08/21 2205   10/08/21 1930  ceFEPIme (MAXIPIME) 2 g in sodium chloride 0.9 % 100 mL IVPB        2 g 200 mL/hr over 30 Minutes Intravenous 2 times daily 10/08/21 1827          Subjective: Patient was seen and examined at bedside.  Overnight events noted.   Patient reported nausea and vomiting and abdominal pain has improved.  She has mild soreness on the abdominal exam but denies any worsening pain.  Objective: Vitals:   10/09/21 1452 10/09/21 2307 10/10/21 0401 10/10/21 0500  BP:  (!) 121/45 (!) 132/46   Pulse:  68 72   Resp:  19 19   Temp:  98.4 F (36.9 C) 98.2 F (36.8 C)   TempSrc:  Oral Oral   SpO2:  100% 97%   Weight: 59 kg   62.7 kg  Height: 5' (1.524 m)       Intake/Output Summary (Last 24 hours) at 10/10/2021 1321 Last data filed at 10/10/2021 2979 Gross per 24 hour  Intake 560 ml  Output 3200 ml  Net -2640 ml   Filed Weights   10/09/21 1452 10/10/21 0500  Weight: 59 kg 62.7 kg    Examination:  General exam: Appears comfortable, not in any acute distress.  Deconditioned Respiratory system: Clear to auscultation bilaterally. Respiratory effort normal.  RR 15 Cardiovascular system: S1-S2 heard, regular rate and rhythm, no murmur. Gastrointestinal system: Abdomen is soft, nondistended, nontender, BS+ Central nervous system: Alert and oriented X 3. No focal neurological deficits. Extremities: No edema, no cyanosis, no clubbing. Skin: No rashes, lesions or ulcers Psychiatry: Judgement and insight appear normal. Mood & affect appropriate.     Data Reviewed: I have personally reviewed following labs and imaging studies  CBC: Recent Labs  Lab 10/08/21 1207 10/09/21 0523 10/10/21 0532  WBC 19.4* 8.0 6.8  NEUTROABS 16.1*  --   --   HGB 10.4* 8.7* 7.6*  HCT 31.8* 26.9* 23.2*  MCV 90.1  93.1 92.1  PLT 265 185 892*   Basic Metabolic Panel: Recent Labs  Lab 10/08/21 1207 10/09/21 0523 10/10/21 0532  NA 135 137 138  K 4.4 4.3 3.3*  CL 100 104 111  CO2 19* 20* 20*  GLUCOSE 236* 131* 120*  BUN 15 15 12   CREATININE 1.01* 0.86 0.88  CALCIUM 9.0 8.4* 7.7*  MG 1.2*  --  1.5*  PHOS  --   --  2.6   GFR: Estimated Creatinine Clearance: 45.7 mL/min (by C-G formula based on SCr of 0.88 mg/dL). Liver Function Tests: Recent Labs  Lab 10/08/21 1207  AST 11*  ALT 9  ALKPHOS 231*  BILITOT 0.6  PROT 6.1*  ALBUMIN 3.1*   No results for input(s): LIPASE, AMYLASE in the last 168 hours. No results for input(s): AMMONIA in the last 168 hours. Coagulation Profile: No results for input(s): INR, PROTIME in the last 168 hours. Cardiac Enzymes: No results for input(s): CKTOTAL, CKMB, CKMBINDEX, TROPONINI in the last 168 hours. BNP (last 3 results) No results for input(s): PROBNP in the last 8760 hours. HbA1C: No results for input(s): HGBA1C in the last 72 hours.  CBG: Recent Labs  Lab 10/09/21 1153 10/09/21 1701 10/09/21 2309 10/10/21 0810 10/10/21 1237  GLUCAP 148* 240* 142* 92 171*   Lipid Profile: No results for input(s): CHOL, HDL, LDLCALC, TRIG, CHOLHDL, LDLDIRECT in the last 72 hours. Thyroid Function Tests: No results for input(s): TSH, T4TOTAL, FREET4, T3FREE, THYROIDAB in the last 72 hours. Anemia Panel: No results for input(s): VITAMINB12, FOLATE, FERRITIN, TIBC, IRON, RETICCTPCT in the last 72 hours. Sepsis Labs: No results for input(s): PROCALCITON, LATICACIDVEN in the last 168 hours.  Recent Results (from the past 240 hour(s))  Urine Culture     Status: Abnormal (Preliminary result)   Collection Time: 10/08/21  5:29 PM   Specimen: Urine, Clean Catch  Result Value Ref Range Status   Specimen Description   Final    URINE, CLEAN CATCH Performed at [facility], [place] [address] [place], [place] [address]    Special Requests   Final     NONE Performed at [facility], [place] [address] [place], [place] [address]    Culture (A)  Final    >=100,000 COLONIES/mL PSEUDOMONAS AERUGINOSA SUSCEPTIBILITIES TO FOLLOW Performed at [facility], [place] [address], [place], [place] [address]    Report Status PENDING  Incomplete  Culture, blood (routine x 2)     Status: None (Preliminary result)   Collection Time: 10/08/21  9:00 PM   Specimen: BLOOD RIGHT ARM  Result Value Ref Range Status   Specimen Description   Final    BLOOD RIGHT ARM Performed at [place] [address] [place], [place] [address]    Special Requests   Final    BOTTLES DRAWN AEROBIC ONLY Blood Culture adequate volume Performed at [place] [address], [place], [place] [address]    Culture   Final    NO GROWTH < 12 HOURS Performed at [place] [address], [place], [place] [address]    Report Status PENDING  Incomplete  Culture, blood (routine x 2)     Status: None (Preliminary result)   Collection Time: 10/09/21  5:25 AM   Specimen: BLOOD LEFT HAND  Result Value Ref Range Status   Specimen Description   Final    BLOOD LEFT HAND Performed at [place] [address], [place], [place] [address]    Special Requests   Final    BOTTLES DRAWN AEROBIC ONLY Blood Culture adequate volume Performed at [place] [address], [place], [place] [address]    Culture   Final    NO GROWTH < 12 HOURS Performed at [place] [address], [place], [place] [address]    Report Status PENDING  Incomplete    Radiology Studies: DG CHEST PORT 1 VIEW  Result Date: 10/08/2021 CLINICAL DATA:  Increasing shortness of breath EXAM: PORTABLE CHEST 1 VIEW COMPARISON:  09/05/2021 FINDINGS: Cardiac shadow is within normal limits. Aortic calcifications are noted. Right chest wall port is noted in satisfactory position. Lungs are  clear bilaterally. No bony abnormality is seen. The known right upper lobe pulmonary nodule on the right is not appreciated due to its small size. No bony abnormality is seen. IMPRESSION: No acute abnormality noted. Electronically Signed   By: Inez Catalina M.D.   On: 10/08/2021 19:33    Scheduled Meds:  aspirin EC  81 mg Oral Daily   atorvastatin  40 mg Oral q morning   cadexomer iodine  Topical BID   Chlorhexidine Gluconate Cloth  6 each Topical Daily   ferrous sulfate  325 mg Oral BID WC   gemfibrozil  600 mg Oral BID   heparin  5,000 Units Subcutaneous Q8H   insulin aspart  0-5 Units Subcutaneous QHS   insulin aspart  0-9 Units Subcutaneous TID WC   lactose free nutrition  237 mL Oral TID WC & HS   levothyroxine  125 mcg Oral Q0600   multivitamin with minerals  1 tablet Oral Daily   pantoprazole  40 mg Oral BID   potassium chloride  40 mEq Oral Once   Continuous Infusions:  sodium chloride 100 mL/hr at 10/09/21 0626   ceFEPime (MAXIPIME) IV 2 g (10/10/21 1018)     LOS: 2 days    Time spent: 25 mins    Patrick Sohm, MD [facility]   If 7PM-7AM, please contact night-coverage

## 2021-10-11 DIAGNOSIS — R935 Abnormal findings on diagnostic imaging of other abdominal regions, including retroperitoneum: Secondary | ICD-10-CM

## 2021-10-11 DIAGNOSIS — R112 Nausea with vomiting, unspecified: Secondary | ICD-10-CM

## 2021-10-11 DIAGNOSIS — A419 Sepsis, unspecified organism: Secondary | ICD-10-CM | POA: Diagnosis not present

## 2021-10-11 DIAGNOSIS — N179 Acute kidney failure, unspecified: Secondary | ICD-10-CM | POA: Diagnosis not present

## 2021-10-11 DIAGNOSIS — R6520 Severe sepsis without septic shock: Secondary | ICD-10-CM | POA: Diagnosis not present

## 2021-10-11 LAB — CBC
HCT: 22.3 % — ABNORMAL LOW (ref 36.0–46.0)
Hemoglobin: 7.1 g/dL — ABNORMAL LOW (ref 12.0–15.0)
MCH: 30 pg (ref 26.0–34.0)
MCHC: 31.8 g/dL (ref 30.0–36.0)
MCV: 94.1 fL (ref 80.0–100.0)
Platelets: 128 10*3/uL — ABNORMAL LOW (ref 150–400)
RBC: 2.37 MIL/uL — ABNORMAL LOW (ref 3.87–5.11)
RDW: 21.4 % — ABNORMAL HIGH (ref 11.5–15.5)
WBC: 5.7 10*3/uL (ref 4.0–10.5)
nRBC: 0.4 % — ABNORMAL HIGH (ref 0.0–0.2)

## 2021-10-11 LAB — BASIC METABOLIC PANEL
Anion gap: 7 (ref 5–15)
BUN: 12 mg/dL (ref 8–23)
CO2: 19 mmol/L — ABNORMAL LOW (ref 22–32)
Calcium: 7.6 mg/dL — ABNORMAL LOW (ref 8.9–10.3)
Chloride: 112 mmol/L — ABNORMAL HIGH (ref 98–111)
Creatinine, Ser: 0.81 mg/dL (ref 0.44–1.00)
GFR, Estimated: >60 mL/min (ref >60)
Glucose, Bld: 175 mg/dL — ABNORMAL HIGH (ref 70–99)
Potassium: 2.9 mmol/L — ABNORMAL LOW (ref 3.5–5.1)
Sodium: 138 mmol/L (ref 135–145)

## 2021-10-11 LAB — URINE CULTURE: Culture: >=100000 — AB

## 2021-10-11 LAB — GLUCOSE, CAPILLARY
Glucose-Capillary: 183 mg/dL — ABNORMAL HIGH (ref 70–99)
Glucose-Capillary: 162 mg/dL — ABNORMAL HIGH (ref 70–99)
Glucose-Capillary: 175 mg/dL — ABNORMAL HIGH (ref 70–99)
Glucose-Capillary: 159 mg/dL — ABNORMAL HIGH (ref 70–99)

## 2021-10-11 LAB — PHOSPHORUS: Phosphorus: 2.2 mg/dL — ABNORMAL LOW (ref 2.5–4.6)

## 2021-10-11 LAB — MAGNESIUM: Magnesium: 1.7 mg/dL (ref 1.7–2.4)

## 2021-10-11 MED ORDER — POTASSIUM PHOSPHATES 15 MMOLE/5ML IV SOLN
30.0000 mmol | Freq: Once | INTRAVENOUS | Status: AC
  Administered 2021-10-11: 30 mmol via INTRAVENOUS
  Filled 2021-10-11: qty 10

## 2021-10-11 MED ORDER — POTASSIUM CHLORIDE 10 MEQ/100ML IV SOLN
10.0000 meq | INTRAVENOUS | Status: AC
  Administered 2021-10-11 (×3): 10 meq via INTRAVENOUS
  Filled 2021-10-11 (×3): qty 100

## 2021-10-11 MED ORDER — POTASSIUM CHLORIDE CRYS ER 20 MEQ PO TBCR
40.0000 meq | EXTENDED_RELEASE_TABLET | Freq: Once | ORAL | Status: AC
  Administered 2021-10-11: 40 meq via ORAL
  Filled 2021-10-11: qty 2

## 2021-10-11 MED ORDER — MELATONIN 5 MG PO TABS
5.0000 mg | ORAL_TABLET | Freq: Once | ORAL | Status: AC
  Administered 2021-10-11: 5 mg via ORAL
  Filled 2021-10-11: qty 1

## 2021-10-11 MED ORDER — CLOPIDOGREL BISULFATE 75 MG PO TABS: 75.0000 mg | ORAL_TABLET | Freq: Every day | ORAL | Status: DC

## 2021-10-11 NOTE — H&P (View-Only) (Signed)
     [facility] Progress Note  CC:  Anemia, dropping Hgb  Subjective:  No more nausea, vomiting, and abdominal pain.  Daughter is at bedside.  Dark brown stool in ostomy bag.  Objective:  Vital signs in last 24 hours: Temp:  [97.5 F (36.4 C)-97.8 F (36.6 C)] 97.5 F (36.4 C) (11/25 0444) Pulse Rate:  [66-71] 67 (11/25 0444) Resp:  [16] 16 (11/25 0444) BP: (103-135)/(44-90) 103/90 (11/25 0444) SpO2:  [100 %] 100 % (11/25 0444) Last BM Date: 10/10/21 General:  Alert, Well-developed, in NAD Heart:  Regular rate and rhythm; no murmurs Pulm:  CTAB.  No W/R/R. Abdomen:  Soft, non-distended.  BS present.  Non-tender.  Dark brown stool in ostomy bag. Extremities:  Without edema. Neurologic:  Alert and oriented x 4;  grossly normal neurologically. Psych:  Alert and cooperative. Normal mood and affect.  Intake/Output from previous day: 11/24 0701 - 11/25 0700 In: 1329.1 [P.O.:240; I.V.:889.1; IV Piggyback:200] Out: 1750 [Urine:1300; Stool:450]  Lab Results: Recent Labs    10/09/21 0523 10/10/21 0532 10/10/21 1400 10/11/21 0500  WBC 8.0 6.8  --  5.7  HGB 8.7* 7.6* 7.5* 7.1*  HCT 26.9* 23.2* 23.7* 22.3*  PLT 185 145*  --  128*   BMET Recent Labs    10/09/21 0523 10/10/21 0532 10/11/21 0500  NA 137 138 138  K 4.3 3.3* 2.9*  CL 104 111 112*  CO2 20* 20* 19*  GLUCOSE 131* 120* 175*  BUN 15 12 12   CREATININE 0.86 0.88 0.81  CALCIUM 8.4* 7.7* 7.6*   LFT Recent Labs    10/08/21 1207  PROT 6.1*  ALBUMIN 3.1*  AST 11*  ALT 9  ALKPHOS 231*  BILITOT 0.6   Assessment / Plan: # 75 yo female with rectal and recurrent cervical adenocarcinoma followed locally at at [facility]. On FOLFOX. She is s/p TAH and diverting colostomy in May 2022.  Disease spread more extensive than appreciated on imaging. LAR was aborted. Additionally the cervical stump was fibrotic and couldn't be removed.  Restating CTAP on 11/15 >>  Unchanged findings of rectal malignancy status post  treatment. Duodenal wall thickening.  # Nausea / vomiting x 1 week. CTAP with marked thickening of the proximal duodenal wall. Etiology of duodenitis unclear at this point.  Those symptoms have resolved, however.   # Heme positive anemia, multifactorial (chronic illness, chemotherapy and probable GI bleeding).  Stools is dark brown in ostomy bag. Received 3 uPRBC during admission for anemia earlier this month. After transfusion Hgb improved from 5.9 to 9.9 grams. Hgb down now to 7.1 grams this AM.  -We will plan for EGD 11/20 6 AM.  Soft diet for now and then n.p.o. after midnight. -She has been off of her Plavix with the last dose 11/23.  She is receiving prophylactic subcu heparin 3 times daily as well, however.    LOS: 3 days   [name]. [name]  10/11/2021, 8:55 AM

## 2021-10-11 NOTE — Progress Notes (Signed)
PROGRESS NOTE    [name]  LNL:[id number] DOB: 04-Feb-1946 DOA: 10/08/2021 PCP: Everardo Beals, NP   Brief Narrative: This 75 years old female with PMH significant of rectal cancer currently following with Dr. Benay Spice, on chemotherapy, s/p diverting loop colostomy on 5/22 at [facility],   history of cervical cancer/uterine cancer, diabetes type 2, recurrent urine tract infections, history of CVA, who presented to the cancer center on unscheduled visit for the evaluation of weakness, nausea, vomiting and confusion. She completed her cycle 5 FOLFOX on 09/17/2021 and she follows with oncology there but also follows at [facility] because she got colostomy there, she was last seen on 10/01/2021 at [facility]. She was admitted here on 09/23/2018 and was discharged on 09/22/2021 after she presented with symptomatic anemia with hemoglobin of 5.8, got blood transfusion with 3 units.  She is found to have leukocytosis. Patient is admitted for possible sepsis, dehydration and started on broad-spectrum antibiotics.  Oncology and GI is consulted.   Assessment & Plan:   Principal Problem:   Sepsis ([place]) Active Problems:   Hypothyroidism   Type 2 diabetes mellitus ([place])   Hyperlipidemia associated with type 2 diabetes mellitus ([place])   History of cervical cancer   Hypomagnesemia   Normocytic anemia   Iron deficiency anemia due to chronic blood loss   Rectal mass   AKI (acute kidney injury) ([place])   Malnutrition of moderate degree   Abnormal CT of the abdomen  Suspected sepsis likely due to UTI: Patient presented with leukocytosis, lethargy, tachycardia.   She does have a history of recurrent UTI.  Source of sepsis likely UTI Initiated on broad-spectrum empiric antibiotics Vanco and cefepime.  De-escalate antibiotics to cefepime.  Discontinue vancomycin Blood cultures : NGTD, urine culture grew Pseudomonas.  Pansensitive. Continue cefepime for now.  Will discharge on ciprofloxacin    AKI/dehydration: Resolved with IV hydration.  Nausea /Vomiting: This could be secondary to suspected sepsis. CT scan done at [facility] showed unchanged findings of rectal malignancy status posttreatment.   Marked thickening of proximal duodenal wall. GI is consulted, states nausea and vomiting has resolved. No plan for EGD.  Continue Zofran as needed for nausea vomiting Patient has recurrence of nausea and vomiting, and hemoglobin has dropped. GI is reconsulted.  Patient is scheduled to have EGD on 11/26.  History of  rectal adeno carcinoma / Cervical adenocarcinoma; Patient follows up with oncology at [facility] and [place]. Not a surgical candidate for cervical adenocarcinoma as per [facility]. Patient complained of vague abdominal discomfort.   CT scanning done a week ago showed unchanged findings of rectal malignancy. If she continues to have abdominal pain,  will consider repeat CT abdomen here. Patient reports abdominal pain has resolved.  Hyperlipidemia: Continue gemfibrozil and Lipitor.  Iron deficiency anemia: Could be secondary to malignancy, rectal cancer causing chronic blood loss.  Continue iron supplementation.  Type 2 diabetes: Continue sliding scale.  Hypothyroidism: Continue levothyroxine.  History of CVA Continue aspirin and Plavix.  Hypomagnesemia /hypokalemia: Replaced . Continue to monitor  Left foot wound: Patient does have a callus-like lesion on the left foot which was resected by podiatry recently.   Wound does not look infected.  Hypokalemia, hypophosphatemia: Replaced.  Continue to monitor  DVT prophylaxis: Heparin subcu Code Status: Full Code Family Communication: No family at bedside Disposition Plan:   Status is: Inpatient  Remains inpatient appropriate because:  Nausea and vomiting has improved but urine culture grew Pseudomonas. Stool occult positive, scheduled to have EGD tomorrow.  Consultants:  GI Oncology  Procedures:  None Antimicrobials:   Anti-infectives (From admission, onward)    Start     Dose/Rate Route Frequency Ordered Stop   10/09/21 2000  vancomycin (VANCOREADY) IVPB 750 mg/150 mL  Status:  Discontinued        750 mg 150 mL/hr over 60 Minutes Intravenous Every 24 hours 10/08/21 1827 10/09/21 1611   10/08/21 1930  vancomycin (VANCOREADY) IVPB 1250 mg/250 mL        1,250 mg 166.7 mL/hr over 90 Minutes Intravenous  Once 10/08/21 1827 10/08/21 2205   10/08/21 1930  ceFEPIme (MAXIPIME) 2 g in sodium chloride 0.9 % 100 mL IVPB        2 g 200 mL/hr over 30 Minutes Intravenous 2 times daily 10/08/21 1827          Subjective: Patient was seen and examined at bedside.  Overnight events noted.   Patient reports nausea,  vomiting and abdominal pain has improved.  She still has abdominal soreness on exam.   Her Hb has dropped to 7.1 from 10.0,  stool occult positive.    Objective: Vitals:   10/10/21 1352 10/10/21 2152 10/11/21 0444 10/11/21 1357  BP: (!) 133/49 (!) 135/44 103/90 (!) 122/55  Pulse: 71 66 67 70  Resp: 16 16 16 19   Temp: 97.8 F (36.6 C) 97.8 F (36.6 C) (!) 97.5 F (36.4 C) 97.8 F (36.6 C)  TempSrc: Oral Oral Oral Oral  SpO2: 100% 100% 100% 100%  Weight:      Height:        Intake/Output Summary (Last 24 hours) at 10/11/2021 1405 Last data filed at 10/11/2021 1324 Gross per 24 hour  Intake 1744.5 ml  Output 1750 ml  Net -5.5 ml   Filed Weights   10/09/21 1452 10/10/21 0500  Weight: 59 kg 62.7 kg    Examination:  General exam: Appears comfortable, not in any acute distress.  Deconditioned Respiratory system: Clear to auscultation bilaterally. Respiratory effort normal.  RR 15 Cardiovascular system: S1-S2 heard, regular rate and rhythm, no murmur. Gastrointestinal system: Abdomen is soft, nondistended, nontender, BS+ Central nervous system: Alert and oriented X 3. No focal neurological deficits. Extremities: No edema, no cyanosis, no clubbing. Skin: No rashes,  lesions or ulcers Psychiatry: Judgement and insight appear normal. Mood & affect appropriate.     Data Reviewed: I have personally reviewed following labs and imaging studies  CBC: Recent Labs  Lab 10/08/21 1207 10/09/21 0523 10/10/21 0532 10/10/21 1400 10/11/21 0500  WBC 19.4* 8.0 6.8  --  5.7  NEUTROABS 16.1*  --   --   --   --   HGB 10.4* 8.7* 7.6* 7.5* 7.1*  HCT 31.8* 26.9* 23.2* 23.7* 22.3*  MCV 90.1 93.1 92.1  --  94.1  PLT 265 185 145*  --  817*   Basic Metabolic Panel: Recent Labs  Lab 10/08/21 1207 10/09/21 0523 10/10/21 0532 10/11/21 0500  NA 135 137 138 138  K 4.4 4.3 3.3* 2.9*  CL 100 104 111 112*  CO2 19* 20* 20* 19*  GLUCOSE 236* 131* 120* 175*  BUN 15 15 12 12   CREATININE 1.01* 0.86 0.88 0.81  CALCIUM 9.0 8.4* 7.7* 7.6*  MG 1.2*  --  1.5* 1.7  PHOS  --   --  2.6 2.2*   GFR: Estimated Creatinine Clearance: 49.6 mL/min (by C-G formula based on SCr of 0.81 mg/dL). Liver Function Tests: Recent Labs  Lab 10/08/21 1207  AST 11*  ALT 9  ALKPHOS 231*  BILITOT 0.6  PROT 6.1*  ALBUMIN 3.1*   No results for input(s): LIPASE, AMYLASE in the last 168 hours. No results for input(s): AMMONIA in the last 168 hours. Coagulation Profile: No results for input(s): INR, PROTIME in the last 168 hours. Cardiac Enzymes: No results for input(s): CKTOTAL, CKMB, CKMBINDEX, TROPONINI in the last 168 hours. BNP (last 3 results) No results for input(s): PROBNP in the last 8760 hours. HbA1C: No results for input(s): HGBA1C in the last 72 hours. CBG: Recent Labs  Lab 10/10/21 1237 10/10/21 1640 10/10/21 2155 10/11/21 0823 10/11/21 1200  GLUCAP 171* 156* 102* 183* 162*   Lipid Profile: No results for input(s): CHOL, HDL, LDLCALC, TRIG, CHOLHDL, LDLDIRECT in the last 72 hours. Thyroid Function Tests: No results for input(s): TSH, T4TOTAL, FREET4, T3FREE, THYROIDAB in the last 72 hours. Anemia Panel: No results for input(s): VITAMINB12, FOLATE, FERRITIN,  TIBC, IRON, RETICCTPCT in the last 72 hours. Sepsis Labs: No results for input(s): PROCALCITON, LATICACIDVEN in the last 168 hours.  Recent Results (from the past 240 hour(s))  Urine Culture     Status: Abnormal   Collection Time: 10/08/21  5:29 PM   Specimen: Urine, Clean Catch  Result Value Ref Range Status   Specimen Description   Final    URINE, CLEAN CATCH Performed at [facility], [place] [address], [place], [place] [address]    Special Requests   Final    NONE Performed at [facility], [place] [address] [place], [place] [address]    Culture >=100,000 COLONIES/mL PSEUDOMONAS AERUGINOSA (A)  Final   Report Status 10/11/2021 FINAL  Final   Organism ID, Bacteria PSEUDOMONAS AERUGINOSA (A)  Final      Susceptibility   Pseudomonas aeruginosa - MIC*    CEFTAZIDIME 4 SENSITIVE Sensitive     CIPROFLOXACIN <=0.25 SENSITIVE Sensitive     GENTAMICIN 2 SENSITIVE Sensitive     IMIPENEM 2 SENSITIVE Sensitive     PIP/TAZO 16 SENSITIVE Sensitive     CEFEPIME 2 SENSITIVE Sensitive     * >=100,000 COLONIES/mL PSEUDOMONAS AERUGINOSA  Culture, blood (routine x 2)     Status: None (Preliminary result)   Collection Time: 10/08/21  9:00 PM   Specimen: BLOOD RIGHT ARM  Result Value Ref Range Status   Specimen Description   Final    BLOOD RIGHT ARM Performed at [place] [address], [place], [place] [address]    Special Requests   Final    BOTTLES DRAWN AEROBIC ONLY Blood Culture adequate volume Performed at [place] [address], [place], [place] [address]    Culture   Final    NO GROWTH 1 DAY Performed at [facility], [place] [address], [place], [place] [address]    Report Status PENDING  Incomplete  Culture, blood (routine x 2)     Status: None (Preliminary result)   Collection Time: 10/09/21  5:25 AM   Specimen: BLOOD LEFT HAND  Result Value Ref Range Status   Specimen Description   Final     BLOOD LEFT HAND Performed at [place] [address], [place], [place] [address]    Special Requests   Final    BOTTLES DRAWN AEROBIC ONLY Blood Culture adequate volume Performed at [place] [address], [place], [place] [address]    Culture   Final    NO GROWTH 1 DAY Performed at [facility]  Lab, [address] [place], [place] [address]    Report Status PENDING  Incomplete    Radiology Studies: No results found.  Scheduled Meds:  aspirin EC  81 mg Oral Daily   atorvastatin  40 mg Oral q morning   cadexomer iodine   Topical BID   Chlorhexidine Gluconate Cloth  6 each Topical Daily   ferrous sulfate  325 mg Oral BID WC   gemfibrozil  600 mg Oral BID   insulin aspart  0-5 Units Subcutaneous QHS   insulin aspart  0-9 Units Subcutaneous TID WC   lactose free nutrition  237 mL Oral TID WC & HS   levothyroxine  125 mcg Oral Q0600   multivitamin with minerals  1 tablet Oral Daily   pantoprazole  40 mg Oral BID   potassium chloride  40 mEq Oral Once   Continuous Infusions:  sodium chloride Stopped (10/11/21 1321)   ceFEPime (MAXIPIME) IV 2 g (10/11/21 1236)   potassium chloride 10 mEq (10/11/21 1321)   potassium PHOSPHATE IVPB (in mmol) 85 mL/hr at 10/11/21 1322     LOS: 3 days    Time spent: 25 mins    Claudina Oliphant, MD [facility]   If 7PM-7AM, please contact night-coverage

## 2021-10-11 NOTE — Care Management Important Message (Signed)
Important Message  Patient Details IM Letter placed in Patients room. Name: [name] MRN: [id number] Date of Birth: 06/15/1946   Medicare Important Message Given:  Yes     Kerin Salen 10/11/2021, 11:53 AM

## 2021-10-11 NOTE — Progress Notes (Signed)
     [facility] Progress Note  CC:  Anemia, dropping Hgb  Subjective:  No more nausea, vomiting, and abdominal pain.  Daughter is at bedside.  Dark brown stool in ostomy bag.  Objective:  Vital signs in last 24 hours: Temp:  [97.5 F (36.4 C)-97.8 F (36.6 C)] 97.5 F (36.4 C) (11/25 0444) Pulse Rate:  [66-71] 67 (11/25 0444) Resp:  [16] 16 (11/25 0444) BP: (103-135)/(44-90) 103/90 (11/25 0444) SpO2:  [100 %] 100 % (11/25 0444) Last BM Date: 10/10/21 General:  Alert, Well-developed, in NAD Heart:  Regular rate and rhythm; no murmurs Pulm:  CTAB.  No W/R/R. Abdomen:  Soft, non-distended.  BS present.  Non-tender.  Dark brown stool in ostomy bag. Extremities:  Without edema. Neurologic:  Alert and oriented x 4;  grossly normal neurologically. Psych:  Alert and cooperative. Normal mood and affect.  Intake/Output from previous day: 11/24 0701 - 11/25 0700 In: 1329.1 [P.O.:240; I.V.:889.1; IV Piggyback:200] Out: 1750 [Urine:1300; Stool:450]  Lab Results: Recent Labs    10/09/21 0523 10/10/21 0532 10/10/21 1400 10/11/21 0500  WBC 8.0 6.8  --  5.7  HGB 8.7* 7.6* 7.5* 7.1*  HCT 26.9* 23.2* 23.7* 22.3*  PLT 185 145*  --  128*   BMET Recent Labs    10/09/21 0523 10/10/21 0532 10/11/21 0500  NA 137 138 138  K 4.3 3.3* 2.9*  CL 104 111 112*  CO2 20* 20* 19*  GLUCOSE 131* 120* 175*  BUN 15 12 12   CREATININE 0.86 0.88 0.81  CALCIUM 8.4* 7.7* 7.6*   LFT Recent Labs    10/08/21 1207  PROT 6.1*  ALBUMIN 3.1*  AST 11*  ALT 9  ALKPHOS 231*  BILITOT 0.6   Assessment / Plan: # 75 yo female with rectal and recurrent cervical adenocarcinoma followed locally at at [facility]. On FOLFOX. She is s/p TAH and diverting colostomy in May 2022.  Disease spread more extensive than appreciated on imaging. LAR was aborted. Additionally the cervical stump was fibrotic and couldn't be removed.  Restating CTAP on 11/15 >>  Unchanged findings of rectal malignancy status post  treatment. Duodenal wall thickening.  # Nausea / vomiting x 1 week. CTAP with marked thickening of the proximal duodenal wall. Etiology of duodenitis unclear at this point.  Those symptoms have resolved, however.   # Heme positive anemia, multifactorial (chronic illness, chemotherapy and probable GI bleeding).  Stools is dark brown in ostomy bag. Received 3 uPRBC during admission for anemia earlier this month. After transfusion Hgb improved from 5.9 to 9.9 grams. Hgb down now to 7.1 grams this AM.  -We will plan for EGD 11/20 6 AM.  Soft diet for now and then n.p.o. after midnight. -She has been off of her Plavix with the last dose 11/23.  She is receiving prophylactic subcu heparin 3 times daily as well, however.    LOS: 3 days   Laban Emperor. Safina Huard  10/11/2021, 8:55 AM

## 2021-10-12 ENCOUNTER — Inpatient Hospital Stay (HOSPITAL_COMMUNITY): Payer: Medicare HMO | Admitting: Anesthesiology

## 2021-10-12 ENCOUNTER — Encounter (HOSPITAL_COMMUNITY)
Admission: AD | Disposition: A | Discharge status: Home / Self Care | Payer: Self-pay | Source: Ambulatory Visit | Attending: Family Medicine

## 2021-10-12 ENCOUNTER — Encounter (HOSPITAL_COMMUNITY): Payer: Self-pay | Admitting: Internal Medicine

## 2021-10-12 DIAGNOSIS — K259 Gastric ulcer, unspecified as acute or chronic, without hemorrhage or perforation: Secondary | ICD-10-CM | POA: Diagnosis not present

## 2021-10-12 DIAGNOSIS — A419 Sepsis, unspecified organism: Secondary | ICD-10-CM | POA: Diagnosis not present

## 2021-10-12 DIAGNOSIS — K2950 Unspecified chronic gastritis without bleeding: Secondary | ICD-10-CM | POA: Diagnosis not present

## 2021-10-12 DIAGNOSIS — K2100 Gastro-esophageal reflux disease with esophagitis, without bleeding: Secondary | ICD-10-CM | POA: Diagnosis not present

## 2021-10-12 DIAGNOSIS — E872 Acidosis, unspecified: Secondary | ICD-10-CM | POA: Diagnosis not present

## 2021-10-12 DIAGNOSIS — B3781 Candidal esophagitis: Secondary | ICD-10-CM | POA: Diagnosis not present

## 2021-10-12 DIAGNOSIS — A4152 Sepsis due to Pseudomonas: Secondary | ICD-10-CM | POA: Diagnosis not present

## 2021-10-12 DIAGNOSIS — Z8616 Personal history of COVID-19: Secondary | ICD-10-CM | POA: Diagnosis not present

## 2021-10-12 DIAGNOSIS — K269 Duodenal ulcer, unspecified as acute or chronic, without hemorrhage or perforation: Secondary | ICD-10-CM

## 2021-10-12 DIAGNOSIS — K3189 Other diseases of stomach and duodenum: Secondary | ICD-10-CM | POA: Diagnosis not present

## 2021-10-12 DIAGNOSIS — K2980 Duodenitis without bleeding: Secondary | ICD-10-CM | POA: Diagnosis not present

## 2021-10-12 DIAGNOSIS — N179 Acute kidney failure, unspecified: Secondary | ICD-10-CM | POA: Diagnosis not present

## 2021-10-12 DIAGNOSIS — K250 Acute gastric ulcer with hemorrhage: Secondary | ICD-10-CM

## 2021-10-12 DIAGNOSIS — C779 Secondary and unspecified malignant neoplasm of lymph node, unspecified: Secondary | ICD-10-CM | POA: Diagnosis not present

## 2021-10-12 DIAGNOSIS — D500 Iron deficiency anemia secondary to blood loss (chronic): Secondary | ICD-10-CM | POA: Diagnosis not present

## 2021-10-12 DIAGNOSIS — R6520 Severe sepsis without septic shock: Secondary | ICD-10-CM | POA: Diagnosis not present

## 2021-10-12 DIAGNOSIS — K264 Chronic or unspecified duodenal ulcer with hemorrhage: Secondary | ICD-10-CM | POA: Diagnosis not present

## 2021-10-12 DIAGNOSIS — K279 Peptic ulcer, site unspecified, unspecified as acute or chronic, without hemorrhage or perforation: Secondary | ICD-10-CM | POA: Diagnosis not present

## 2021-10-12 DIAGNOSIS — K2289 Other specified disease of esophagus: Secondary | ICD-10-CM | POA: Diagnosis not present

## 2021-10-12 DIAGNOSIS — K208 Other esophagitis without bleeding: Secondary | ICD-10-CM | POA: Diagnosis not present

## 2021-10-12 DIAGNOSIS — C20 Malignant neoplasm of rectum: Secondary | ICD-10-CM | POA: Diagnosis not present

## 2021-10-12 DIAGNOSIS — E440 Moderate protein-calorie malnutrition: Secondary | ICD-10-CM | POA: Diagnosis not present

## 2021-10-12 HISTORY — PX: ESOPHAGOGASTRODUODENOSCOPY (EGD) WITH PROPOFOL: SHX5813

## 2021-10-12 HISTORY — PX: BIOPSY: SHX5522

## 2021-10-12 LAB — CBC
HCT: 23.7 % — ABNORMAL LOW (ref 36.0–46.0)
Hemoglobin: 7.5 g/dL — ABNORMAL LOW (ref 12.0–15.0)
MCH: 30.4 pg (ref 26.0–34.0)
MCHC: 31.6 g/dL (ref 30.0–36.0)
MCV: 96 fL (ref 80.0–100.0)
Platelets: 130 10*3/uL — ABNORMAL LOW (ref 150–400)
RBC: 2.47 MIL/uL — ABNORMAL LOW (ref 3.87–5.11)
RDW: 21.7 % — ABNORMAL HIGH (ref 11.5–15.5)
WBC: 5.9 10*3/uL (ref 4.0–10.5)
nRBC: 0 % (ref 0.0–0.2)

## 2021-10-12 LAB — WOUND CULTURE
MICRO NUMBER:: 12670354
RESULT:: NO GROWTH
SPECIMEN QUALITY:: ADEQUATE

## 2021-10-12 LAB — BASIC METABOLIC PANEL
Anion gap: 7 (ref 5–15)
BUN: 10 mg/dL (ref 8–23)
CO2: 18 mmol/L — ABNORMAL LOW (ref 22–32)
Calcium: 7.9 mg/dL — ABNORMAL LOW (ref 8.9–10.3)
Chloride: 112 mmol/L — ABNORMAL HIGH (ref 98–111)
Creatinine, Ser: 0.77 mg/dL (ref 0.44–1.00)
GFR, Estimated: >60 mL/min (ref >60)
Glucose, Bld: 101 mg/dL — ABNORMAL HIGH (ref 70–99)
Potassium: 3.6 mmol/L (ref 3.5–5.1)
Sodium: 137 mmol/L (ref 135–145)

## 2021-10-12 LAB — PHOSPHORUS: Phosphorus: 3.4 mg/dL (ref 2.5–4.6)

## 2021-10-12 LAB — GLUCOSE, CAPILLARY
Glucose-Capillary: 119 mg/dL — ABNORMAL HIGH (ref 70–99)
Glucose-Capillary: 158 mg/dL — ABNORMAL HIGH (ref 70–99)

## 2021-10-12 LAB — MAGNESIUM: Magnesium: 1.5 mg/dL — ABNORMAL LOW (ref 1.7–2.4)

## 2021-10-12 LAB — CLIENT EDUCATION TRACKING

## 2021-10-12 LAB — HOUSE ACCOUNT TRACKING

## 2021-10-12 SURGERY — ESOPHAGOGASTRODUODENOSCOPY (EGD) WITH PROPOFOL
Anesthesia: Monitor Anesthesia Care

## 2021-10-12 MED ORDER — ONDANSETRON HCL 4 MG/2ML IJ SOLN
INTRAMUSCULAR | Status: DC | PRN
  Administered 2021-10-12: 4 mg via INTRAVENOUS

## 2021-10-12 MED ORDER — PROPOFOL 500 MG/50ML IV EMUL
INTRAVENOUS | Status: AC
  Filled 2021-10-12: qty 50

## 2021-10-12 MED ORDER — PANTOPRAZOLE SODIUM 40 MG PO TBEC: 40.0000 mg | DELAYED_RELEASE_TABLET | Freq: Two times a day (BID) | ORAL | 2 refills | Status: DC

## 2021-10-12 MED ORDER — PROPOFOL 10 MG/ML IV BOLUS
INTRAVENOUS | Status: DC | PRN
  Administered 2021-10-12 (×2): 20 mg via INTRAVENOUS
  Administered 2021-10-12: 40 mg via INTRAVENOUS
  Administered 2021-10-12 (×4): 20 mg via INTRAVENOUS

## 2021-10-12 MED ORDER — MAGNESIUM SULFATE 2 GM/50ML IV SOLN
2.0000 g | Freq: Once | INTRAVENOUS | Status: AC
  Administered 2021-10-12: 2 g via INTRAVENOUS
  Filled 2021-10-12: qty 50

## 2021-10-12 MED ORDER — LACTATED RINGERS IV SOLN
INTRAVENOUS | Status: AC | PRN
  Administered 2021-10-12: 1000 mL via INTRAVENOUS

## 2021-10-12 MED ORDER — CLOPIDOGREL BISULFATE 75 MG PO TABS: 75.0000 mg | ORAL_TABLET | Freq: Every day | ORAL | 1 refills | Status: DC

## 2021-10-12 MED ORDER — SODIUM CHLORIDE 0.9% FLUSH: 10.0000 mL | INTRAVENOUS | Status: DC | PRN

## 2021-10-12 SURGICAL SUPPLY — 15 items

## 2021-10-12 NOTE — Op Note (Signed)
[facility] Patient Name: [name] Procedure Date: 10/12/2021 MRN: [id number] Attending MD: Thornton Park MD, MD Date of Birth: 04/18/1946 CSN: [id number] Age: 75 Admit Type: Inpatient Procedure:                Upper GI endoscopy Indications:              Nausea, vomiting, progressive anemia and abnormal                            duodenal wall thickening seen on CT in the setting                            of locally advanced inoperable rectal                            adenocarcinoma with LN mets                           Last EGD 2/22 showed mild gastritis and mild reflux                            esophagitis Providers:                Thornton Park MD, MD, Glori Bickers, RN, Benetta Spar, Technician Referring MD:              Medicines:                Monitored Anesthesia Care Complications:            No immediate complications. Estimated blood loss:                            Minimal. Estimated Blood Loss:     Estimated blood loss was minimal. Procedure:                Pre-Anesthesia Assessment:                           - Prior to the procedure, a History and Physical                            was performed, and patient medications and                            allergies were reviewed. The patient's tolerance of                            previous anesthesia was also reviewed. The risks                            and benefits of the procedure and the sedation                            options and risks were discussed with the patient.  All questions were answered, and informed consent                            was obtained. Prior Anticoagulants: The patient has                            taken Plavix (clopidogrel), last dose was 4 days                            prior to procedure. ASA Grade Assessment: III - A                            patient with severe systemic disease. After                             reviewing the risks and benefits, the patient was                            deemed in satisfactory condition to undergo the                            procedure.                           After obtaining informed consent, the endoscope was                            passed under direct vision. Throughout the                            procedure, the patient's blood pressure, pulse, and                            oxygen saturations were monitored continuously. The                            [id number]-H190 ([id number]) Olympus endoscope was introduced                            through the mouth, and advanced to the third part                            of duodenum. The upper GI endoscopy was                            accomplished without difficulty. The patient                            tolerated the procedure well. Scope In: Scope Out: Findings:      There possible white plaques found in the middle third of the esophagus.       Most of these appeared to move with lavage suggesting food or medicine       residue. Extensive lavage was not possible due to the risk for  aspiration. Underlying candida is a possibility. Biopsies were taken       with a cold forceps for histology. Estimated blood loss was minimal.      LA Grade A (one or more mucosal breaks less than 5 mm, not extending       between tops of 2 mucosal folds) esophagitis with no bleeding was found       36 cm from the incisors.      Three non-bleeding cratered gastric ulcers with no stigmata of bleeding       were found in the gastric body and in the gastric antrum. The       surrounding mucosa was not heaped or irregular. The largest lesion was 8       mm in largest dimension. Biopsies were taken from the antrum, body,       fundus, and ulcer margins of two ulcers with a cold forceps for       histology. Estimated blood loss was minimal.      Localized moderate mucosal changes characterized by an increased        vascular pattern with a near verrucous appearance were found in the       proximal duodenal bulb. Biopsies were taken with a cold forceps for       histology. Estimated blood loss was minimal.      One non-bleeding cratered duodenal ulcer with no stigmata of bleeding       was found in the first portion of the duodenum. The lesion was 10 mm in       largest dimension. No evidence for active or recent bleeding. No fresh       blood seen during this procedure. Impression:               - Symptomatic gastric ulcer and duodenal ulcer                            disease. Biopsies pending.                           - Possible esophageal plaques were found,                            suspicious for candidiasis. Biopsied.                           - LA Grade A reflux esophagitis with no bleeding.                           - Mucosal changes in the duodenum of unclear                            clinical significance. Biopsied. Moderate Sedation:      Not Applicable - Patient had care per Anesthesia. Recommendation:           - Return patient to hospital ward for ongoing care.                           - Resume regular diet.                           -  Continue present medications including                            pantoprazole 40 mg BID.                           - Avoid all NSAIDs.                           - Okay to resume Plavix today.                           - Await pathology results.                           - Repeat upper endoscopy in 3 months to check                            healing. Continue pantoprazole 40 mg BID until that                            time.                           The inpatient GI team will move to stand-by. Please                            call the on-call gastroenterologist with any                            additional questions or concerns. I will arrange                            for outpatient GI follow-up and the EGD in 3 months                             to document healing of the ulcers. Procedure Code(s):        --- Professional ---                           [phone], Esophagogastroduodenoscopy, flexible,                            transoral; with biopsy, single or multiple Diagnosis Code(s):        --- Professional ---                           K22.9, Disease of esophagus, unspecified                           K21.00, Gastro-esophageal reflux disease with                            esophagitis, without bleeding  K25.9, Gastric ulcer, unspecified as acute or                            chronic, without hemorrhage or perforation                           K31.89, Other diseases of stomach and duodenum                           K26.9, Duodenal ulcer, unspecified as acute or                            chronic, without hemorrhage or perforation                           D50.0, Iron deficiency anemia secondary to blood                            loss (chronic)                           R11.2, Nausea with vomiting, unspecified                           R93.3, Abnormal findings on diagnostic imaging of                            other parts of digestive tract CPT copyright 2019 [employer]. All rights reserved. The codes documented in this report are preliminary and upon coder review may  be revised to meet current compliance requirements. Thornton Park MD, MD 10/12/2021 8:38:31 AM This report has been signed electronically. Number of Addenda: 0

## 2021-10-12 NOTE — Transfer of Care (Signed)
Immediate Anesthesia Transfer of Care Note  Patient: [name]  Procedure(s) Performed: ESOPHAGOGASTRODUODENOSCOPY (EGD) WITH PROPOFOL BIOPSY  Patient Location: PACU  Anesthesia Type:MAC  Level of Consciousness: sedated  Airway & Oxygen Therapy: Patient Spontanous Breathing and Patient connected to face mask oxygen  Post-op Assessment: Report given to RN and Post -op Vital signs reviewed and stable  Post vital signs: Reviewed and stable  Last Vitals:  Vitals Value Taken Time  BP 137/50 10/12/21 0830  Temp    Pulse 70 10/12/21 0833  Resp 34 10/12/21 0833  SpO2 100 % 10/12/21 0833  Vitals shown include unvalidated device data.  Last Pain:  Vitals:   10/12/21 0754  TempSrc: Oral  PainSc: 0-No pain      Patients Stated Pain Goal: 0 (27/51/70 0174)  Complications: No notable events documented.

## 2021-10-12 NOTE — Progress Notes (Signed)
Patient discharged to home with daughter, discharge instructions reviewed with patient and daughter who verbalized understanding.

## 2021-10-12 NOTE — Interval H&P Note (Signed)
History and Physical Interval Note:  10/12/2021 8:03 AM  [name]  has presented today for surgery, with the diagnosis of Abnormal CT scan and anemia.  The various methods of treatment have been discussed with the patient and family. After consideration of risks, benefits and other options for treatment, the patient has consented to  Procedure(s): ESOPHAGOGASTRODUODENOSCOPY (EGD) WITH PROPOFOL (N/A) as a surgical intervention.  The patient's history has been reviewed, patient examined, no change in status, stable for surgery.  I have reviewed the patient's chart and labs.  Questions were answered to the patient's satisfaction.     Thornton Park

## 2021-10-12 NOTE — Anesthesia Preprocedure Evaluation (Addendum)
Anesthesia Evaluation  Patient identified by MRN, date of birth, ID band Patient awake    Reviewed: Allergy & Precautions, NPO status , Patient's Chart, lab work & pertinent test results  History of Anesthesia Complications Negative for: history of anesthetic complications  Airway Mallampati: II  TM Distance: >3 FB Neck ROM: Full    Dental  (+) Dental Advisory Given   Pulmonary former smoker,    Pulmonary exam normal        Cardiovascular hypertension, + CAD  Normal cardiovascular exam     Neuro/Psych CVA, Residual Symptoms negative psych ROS   GI/Hepatic negative GI ROS, Neg liver ROS,   Endo/Other  diabetes, Type 2, Oral Hypoglycemic Agents, Insulin DependentHypothyroidism  K 2.9 Ca 7.6 Cl 112   Renal/GU   Female GU complaint     Musculoskeletal  (+) Arthritis , narcotic dependent  Abdominal   Peds  Hematology  (+) anemia ,  On plavix Plt 130k    Anesthesia Other Findings   Reproductive/Obstetrics                            Anesthesia Physical Anesthesia Plan  ASA: 3  Anesthesia Plan: MAC   Post-op Pain Management: Minimal or no pain anticipated   Induction:   PONV Risk Score and Plan: 2 and Propofol infusion and Treatment may vary due to age or medical condition  Airway Management Planned: Nasal Cannula and Natural Airway  Additional Equipment: None  Intra-op Plan:   Post-operative Plan:   Informed Consent: I have reviewed the patients History and Physical, chart, labs and discussed the procedure including the risks, benefits and alternatives for the proposed anesthesia with the patient or authorized representative who has indicated his/her understanding and acceptance.       Plan Discussed with: CRNA and Anesthesiologist  Anesthesia Plan Comments:        Anesthesia Quick Evaluation

## 2021-10-12 NOTE — Discharge Instructions (Signed)
Advised to follow-up with primary care physician in 1 week. Advised to follow-up with oncologist as scheduled. Advised to avoid NSAIDs, found to have gastric and duodenal ulcers non bleeding on EGD. Advised to take pantoprazole 40 mg twice daily. Follow-up with GI for repeat EGD in 3 months to see healing of ulcers. Patient completed 5-day treatment for Pseudomonas UTI.

## 2021-10-12 NOTE — Discharge Summary (Signed)
Physician Discharge Summary  KIYO HEAL ZHG:[id number] DOB: March 10, 1946 DOA: 10/08/2021  PCP: Everardo Beals, NP  Admit date: 10/08/2021.  Discharge date: 10/12/2021.  Admitted From: Home. Disposition:  Home  Recommendations for Outpatient Follow-up:  Follow up with PCP in 1-2 weeks. Please obtain BMP/CBC in one week. Advised to follow-up with oncologist as scheduled. Advised to avoid NSAIDs, EGD showed gastric and duodenal ulcers nonbleeding. Advised to take pantoprazole 40 mg twice daily. Follow-up with GI for repeat EGD in 3 months to see healing of ulcers. Patient completed 5-day treatment for Pseudomonas UTI.  Home Health: None Equipment/Devices:None  Discharge Condition: Stable CODE STATUS:Full code Diet recommendation: Heart Healthy   Brief Summary: This 75 years old female with PMH significant of rectal cancer currently following up with Dr. Benay Spice, on chemotherapy, s/p diverting loop colostomy on 5/22 at [facility], history of cervical cancer/uterine cancer, diabetes type 2, recurrent urine tract infections, history of CVA, who presented to the cancer center on unscheduled visit for the evaluation of weakness, Nausea, vomiting and confusion. She completed her cycle 5 FOLFOX on 09/17/2021 and she follows with oncology there but also follows at [facility] because she got colostomy there, she was last seen on 10/01/2021 at [facility]. She was admitted at [facility] on 09/23/2018 and was discharged on 09/22/2021 after she presented with symptomatic anemia with hemoglobin of 5.8, got blood transfusion with 3 units.  She is found to have leukocytosis. She is found to have sepsis sec. To UTI.  Hospital Course: Patient was admitted for possible sepsis, dehydration and started on broad-spectrum antibiotics.  Oncology and GI is consulted. GI is consulted for intractable Nausea and Vomiting.  Patient was initially started on vancomycin and cefepime.  Blood cultures no growth.  Urine culture grew  Pseudomonas.  Patient has completed 5 days of IV antibiotics.  AKI and dehydration resolved with IV hydration.  Hemoglobin has dropped, continued to have persistent nausea and vomiting.  Patient underwent EGD and found to have gastric and duodenal ulcers nonbleeding.  As per GI there is low risk for bleeding.  Patient's hemoglobin remained stable.  Patient feels better and wants  to be discharged.  Patient is cleared from GI.  Patient being discharged home and she will follow-up with GI, advised to avoid NSAIDs and take Protonix 40 mg twice daily.  She will need repeat EGD in 3 months to ensure healing of the ulcers.  She was managed for below problems  Discharge Diagnoses:  Principal Problem:   Sepsis ([place]) Active Problems:   Hypothyroidism   Type 2 diabetes mellitus ([place])   Hyperlipidemia associated with type 2 diabetes mellitus ([place])   History of cervical cancer   Hypomagnesemia   Normocytic anemia   Iron deficiency anemia due to chronic blood loss   Rectal mass   AKI (acute kidney injury) ([place])   Malnutrition of moderate degree   Abnormal CT of the abdomen   Duodenal ulcer   Acute gastric ulcer with hemorrhage  Suspected sepsis likely due to UTI: Patient presented with leukocytosis, lethargy, tachycardia.   She does have a history of recurrent UTI.  Source of sepsis likely UTI Initiated on broad-spectrum empiric antibiotics Vanco and cefepime.  De-escalate antibiotics to cefepime.  Discontinue vancomycin Blood cultures : NGTD, urine culture grew Pseudomonas.  Pansensitive. Completed antibiotics for 5 days.  Sepsis physiology resolved   AKI/dehydration: Resolved with IV hydration.   Nausea /Vomiting: This could be secondary to suspected sepsis. CT scan done at [facility] showed unchanged findings  of rectal malignancy status posttreatment.   Marked thickening of proximal duodenal wall. GI is consulted, states nausea and vomiting has resolved. No plan for EGD.  Continue Zofran as  needed for nausea vomiting Patient has recurrence of nausea and vomiting, and hemoglobin has dropped. GI is reconsulted.  Patient underwent EGD found to have gastric and duodenal ulcers.  Which were nonbleeding. GI recommended to continue Protonix 40 mg twice daily for 3 months.   History of  rectal adeno carcinoma / Cervical adenocarcinoma; Patient follows up with oncology at [facility] and [place]. Not a surgical candidate for cervical adenocarcinoma as per [facility]. Patient complained of vague abdominal discomfort.   CT scanning done a week ago showed unchanged findings of rectal malignancy. If she continues to have abdominal pain,  will consider repeat CT abdomen here. Patient reports abdominal pain has resolved.   Hyperlipidemia: Continue gemfibrozil and Lipitor.   Iron deficiency anemia: Could be secondary to malignancy, rectal cancer causing chronic blood loss.  Continue iron supplementation.   Type 2 diabetes: Continue sliding scale.   Hypothyroidism: Continue levothyroxine.   History of CVA Continue aspirin and Plavix.   Hypomagnesemia /hypokalemia: Replaced . Continue to monitor   Left foot wound: Patient does have a callus-like lesion on the left foot which was resected by podiatry recently.   Wound does not look infected.   Hypokalemia, hypophosphatemia: Replaced.  Continue to monitor  Discharge Instructions  Discharge Instructions     Call MD for:  difficulty breathing, headache or visual disturbances   Complete by: As directed    Call MD for:  persistant dizziness or light-headedness   Complete by: As directed    Call MD for:  persistant nausea and vomiting   Complete by: As directed    Diet - low sodium heart healthy   Complete by: As directed    Diet Carb Modified   Complete by: As directed    Discharge instructions   Complete by: As directed    Advised to follow-up with primary care physician in 1 week. Advised to follow-up with oncologist as  scheduled. Advised to avoid NSAIDs, patient is found to have gastric and duodenal ulcers nonbleeding. Advised to take pantoprazole 40 mg twice daily. Follow-up with GI for repeat EGD in 3 months to see healing of ulcers. Patient completed 5-day treatment for Pseudomonas UTI.   Discharge wound care:   Complete by: As directed    Follow-up PCP.   Increase activity slowly   Complete by: As directed       Allergies as of 10/12/2021       Reactions   Codeine Nausea Only        Medication List     STOP taking these medications    Xtampza ER 9 MG C12a Generic drug: oxyCODONE ER       TAKE these medications    aspirin 81 MG EC tablet Take 1 tablet (81 mg total) by mouth daily. RESUME AFTER 1 WEEK What changed:  when to take this additional instructions   atorvastatin 40 MG tablet Commonly known as: LIPITOR Take 40 mg by mouth every morning.   baclofen 10 MG tablet Commonly known as: LIORESAL Take 10 mg by mouth 4 (four) times daily as needed for muscle spasms.   cephALEXin 250 MG capsule Commonly known as: KEFLEX Take 1 capsule (250 mg total) by mouth daily. RESUME AFTER YOU HAVE COMPLETED THE COURSE OF CIPRO What changed:  when to take this additional instructions  clopidogrel 75 MG tablet Commonly known as: PLAVIX Take 1 tablet (75 mg total) by mouth at bedtime.   docusate sodium 100 MG capsule Commonly known as: COLACE Take 100 mg by mouth 2 (two) times daily as needed for mild constipation.   ferrous sulfate 325 (65 FE) MG tablet Take 1 tablet (325 mg total) by mouth 2 (two) times daily with a meal.   gabapentin 300 MG capsule Commonly known as: NEURONTIN Take 300-600 mg by mouth See admin instructions. Take one capsule (300 mg) by mouth every morning and two capsules (600 mg) at night   gemfibrozil 600 MG tablet Commonly known as: LOPID Take 600 mg by mouth 2 (two) times daily.   levothyroxine 125 MCG tablet Commonly known as: SYNTHROID Take 1  tablet (125 mcg total) by mouth daily at 6 (six) AM.   metFORMIN 500 MG tablet Commonly known as: GLUCOPHAGE Take 500 mg by mouth 2 (two) times daily.   nystatin cream Commonly known as: MYCOSTATIN Apply 1 application topically 2 (two) times daily as needed (groin rash). Mix with triamcinolone cream What changed: Another medication with the same name was changed. Make sure you understand how and when to take each.   nystatin powder Commonly known as: MYCOSTATIN/NYSTOP Apply 1 application topically 2 (two) times daily. To groin rash What changed:  when to take this reasons to take this additional instructions   OneTouch Delica Plus [id number] Misc Apply topically 2 (two) times daily.   OneTouch Verio test strip Generic drug: glucose blood 1 each 2 (two) times daily.   pantoprazole 40 MG tablet Commonly known as: PROTONIX Take 1 tablet (40 mg total) by mouth 2 (two) times daily. What changed:  medication strength how much to take when to take this   polyethylene glycol powder 17 GM/SCOOP powder Commonly known as: GLYCOLAX/MIRALAX Take 17 g by mouth 2 (two) times daily as needed (constipation).   potassium chloride SA 20 MEQ tablet Commonly known as: KLOR-CON Take 1 tablet (20 mEq total) by mouth 2 (two) times daily.   Tyler Aas FlexTouch 100 UNIT/ML FlexTouch Pen Generic drug: insulin degludec Inject 10 Units into the skin daily. What changed:  how much to take when to take this reasons to take this   triamcinolone cream 0.1 % Commonly known as: KENALOG Apply 1 application topically 2 (two) times daily as needed (groin rash). Mix with nystatin cream               Discharge Care Instructions  (From admission, onward)           Start     Ordered   10/12/21 0000  Discharge wound care:       Comments: Follow-up PCP.   10/12/21 1014            Follow-up Information     [place], Joelene Millin, NP Follow up in 1 week(s).   Contact information: [place] [address] [phone]         Thornton Park, MD Follow up in 3 month(s).   Specialty: Gastroenterology Contact information: [place] [address] [phone]         Ladell Pier, MD Follow up in 1 week(s).   Specialty: Oncology Contact information: [address] [place] [address] [phone]                Allergies  Allergen Reactions   Codeine Nausea Only    Consultations: GI Oncology  Procedures/Studies: DG CHEST PORT 1 VIEW  Result Date: 10/08/2021 CLINICAL DATA:  Increasing shortness of breath EXAM: PORTABLE CHEST 1 VIEW COMPARISON:  09/05/2021 FINDINGS: Cardiac shadow is within normal limits. Aortic calcifications are noted. Right chest wall port is noted in satisfactory position. Lungs are clear bilaterally. No bony abnormality is seen. The known right upper lobe pulmonary nodule on the right is not appreciated due to its small size. No bony abnormality is seen. IMPRESSION: No acute abnormality noted. Electronically Signed   By: Inez Catalina M.D.   On: 10/08/2021 19:33    EGD   Subjective: Patient was seen and examined at bedside.  Overnight events noted.  Patient reports feeling much improved.  After EGD she wants to be discharged.  Discharge Exam: Vitals:   10/12/21 0850 10/12/21 0857  BP: (!) 132/50 125/64  Pulse: 67 64  Resp: (!) 21 13  Temp: 97.7 F (36.5 C)   SpO2: 97% 96%   Vitals:   10/12/21 0830 10/12/21 0841 10/12/21 0850 10/12/21 0857  BP: (!) 137/50 (!) 113/57 (!) 132/50 125/64  Pulse: 72 75 67 64  Resp: 17 (!) 21 (!) 21 13  Temp: (!) 97 F (36.1 C)  97.7 F (36.5 C)   TempSrc:      SpO2: 100% 98% 97% 96%  Weight:      Height:        General: Pt is alert, awake, not in acute distress Cardiovascular: RRR, S1/S2 +, no rubs, no gallops Respiratory: CTA bilaterally, no wheezing, no rhonchi Abdominal: Soft, NT, ND, bowel sounds + Extremities: no edema,  no cyanosis    The results of significant diagnostics from this hospitalization (including imaging, microbiology, ancillary and laboratory) are listed below for reference.     Microbiology: Recent Results (from the past 240 hour(s))  Urine Culture     Status: Abnormal   Collection Time: 10/08/21  5:29 PM   Specimen: Urine, Clean Catch  Result Value Ref Range Status   Specimen Description   Final    URINE, CLEAN CATCH Performed at [facility], [place] [address] [place], [place] [address]    Special Requests   Final    NONE Performed at [facility], [place] [address], [place], [place] [address]    Culture >=100,000 COLONIES/mL PSEUDOMONAS AERUGINOSA (A)  Final   Report Status 10/11/2021 FINAL  Final   Organism ID, Bacteria PSEUDOMONAS AERUGINOSA (A)  Final      Susceptibility   Pseudomonas aeruginosa - MIC*    CEFTAZIDIME 4 SENSITIVE Sensitive     CIPROFLOXACIN <=0.25 SENSITIVE Sensitive     GENTAMICIN 2 SENSITIVE Sensitive     IMIPENEM 2 SENSITIVE Sensitive     PIP/TAZO 16 SENSITIVE Sensitive     CEFEPIME 2 SENSITIVE Sensitive     * >=100,000 COLONIES/mL PSEUDOMONAS AERUGINOSA  Culture, blood (routine x 2)     Status: None (Preliminary result)   Collection Time: 10/08/21  9:00 PM   Specimen: BLOOD RIGHT ARM  Result Value Ref Range Status   Specimen Description   Final    BLOOD RIGHT ARM Performed at [place] [address] [place], [place] [address]    Special Requests   Final    BOTTLES DRAWN AEROBIC ONLY Blood Culture adequate volume Performed at [place] [address] [place], [place] [address]    Culture   Final    NO GROWTH 3 DAYS Performed at [facility], [address], [place], [place] [address]    Report Status PENDING  Incomplete  Culture, blood (routine x 2)     Status: None (Preliminary result)   Collection Time: 10/09/21  5:25 AM   Specimen: BLOOD LEFT HAND   Result Value Ref Range Status   Specimen Description   Final    BLOOD LEFT HAND Performed at [place] [address] [place], [place] [address]    Special Requests   Final    BOTTLES DRAWN AEROBIC ONLY Blood Culture adequate volume Performed at [place] [address], [place], [place] [address]    Culture   Final    NO GROWTH 3 DAYS Performed at [facility], [place] [address] [place],  [address]    Report Status PENDING  Incomplete     Labs: BNP (last 3 results) No results for input(s): BNP in the last 8760 hours. Basic Metabolic Panel: Recent Labs  Lab 10/08/21 1207 10/09/21 0523 10/10/21 0532 10/11/21 0500 10/12/21 0643  NA 135 137 138 138 137  K 4.4 4.3 3.3* 2.9* 3.6  CL 100 104 111 112* 112*  CO2 19* 20* 20* 19* 18*  GLUCOSE 236* 131* 120* 175* 101*  BUN 15 15 12 12 10   CREATININE 1.01* 0.86 0.88 0.81 0.77  CALCIUM 9.0 8.4* 7.7* 7.6* 7.9*  MG 1.2*  --  1.5* 1.7 1.5*  PHOS  --   --  2.6 2.2* 3.4   Liver Function Tests: Recent Labs  Lab 10/08/21 1207  AST 11*  ALT 9  ALKPHOS 231*  BILITOT 0.6  PROT 6.1*  ALBUMIN 3.1*   No results for input(s): LIPASE, AMYLASE in the last 168 hours. No results for input(s): AMMONIA in the last 168 hours. CBC: Recent Labs  Lab 10/08/21 1207 10/09/21 0523 10/10/21 0532 10/10/21 1400 10/11/21 0500 10/12/21 0643  WBC 19.4* 8.0 6.8  --  5.7 5.9  NEUTROABS 16.1*  --   --   --   --   --   HGB 10.4* 8.7* 7.6* 7.5* 7.1* 7.5*  HCT 31.8* 26.9* 23.2* 23.7* 22.3* 23.7*  MCV 90.1 93.1 92.1  --  94.1 96.0  PLT 265 185 145*  --  128* 130*   Cardiac Enzymes: No results for input(s): CKTOTAL, CKMB, CKMBINDEX, TROPONINI in the last 168 hours. BNP: Invalid input(s): POCBNP CBG: Recent Labs  Lab 10/11/21 1200 10/11/21 1618 10/11/21 2200 10/12/21 0834 10/12/21 1228  GLUCAP 162* 175* 159* 119* 158*   D-Dimer No results for input(s): DDIMER in the last 72  hours. Hgb A1c No results for input(s): HGBA1C in the last 72 hours. Lipid Profile No results for input(s): CHOL, HDL, LDLCALC, TRIG, CHOLHDL, LDLDIRECT in the last 72 hours. Thyroid function studies No results for input(s): TSH, T4TOTAL, T3FREE, THYROIDAB in the last 72 hours.  Invalid input(s): FREET3 Anemia work up No results for input(s): VITAMINB12, FOLATE, FERRITIN, TIBC, IRON, RETICCTPCT in the last 72 hours. Urinalysis    Component Value Date/Time   COLORURINE YELLOW 10/08/2021 1729   APPEARANCEUR CLOUDY (A) 10/08/2021 1729   LABSPEC 1.010 10/08/2021 1729   PHURINE 5.0 10/08/2021 1729   GLUCOSEU 50 (A) 10/08/2021 1729   HGBUR MODERATE (A) 10/08/2021 1729   BILIRUBINUR NEGATIVE 10/08/2021 1729   KETONESUR NEGATIVE 10/08/2021 1729   PROTEINUR 30 (A) 10/08/2021 1729   UROBILINOGEN 0.2 05/27/2014 1203   NITRITE POSITIVE (A) 10/08/2021 1729   LEUKOCYTESUR LARGE (A) 10/08/2021 1729   Sepsis Labs  Invalid input(s): PROCALCITONIN,  WBC,  LACTICIDVEN Microbiology Recent Results (from the past 240 hour(s))  Urine Culture     Status: Abnormal   Collection Time: 10/08/21  5:29 PM   Specimen: Urine, Clean Catch  Result Value Ref Range Status   Specimen Description   Final    URINE, CLEAN CATCH Performed at [facility], [place] [address], [place], [place] [address]    Special Requests   Final    NONE Performed at [facility], [place] [address], [place], [place] [address]    Culture >=100,000 COLONIES/mL PSEUDOMONAS AERUGINOSA (A)  Final   Report Status 10/11/2021 FINAL  Final   Organism ID, Bacteria PSEUDOMONAS AERUGINOSA (A)  Final      Susceptibility   Pseudomonas aeruginosa - MIC*    CEFTAZIDIME 4 SENSITIVE Sensitive     CIPROFLOXACIN <=0.25 SENSITIVE Sensitive     GENTAMICIN 2 SENSITIVE Sensitive     IMIPENEM 2 SENSITIVE Sensitive     PIP/TAZO 16 SENSITIVE Sensitive     CEFEPIME 2 SENSITIVE Sensitive     * >=100,000 COLONIES/mL  PSEUDOMONAS AERUGINOSA  Culture, blood (routine x 2)     Status: None (Preliminary result)   Collection Time: 10/08/21  9:00 PM   Specimen: BLOOD RIGHT ARM  Result Value Ref Range Status   Specimen Description   Final    BLOOD RIGHT ARM Performed at [place] [address] [place], [place] [address]    Special Requests   Final    BOTTLES DRAWN AEROBIC ONLY Blood Culture adequate volume Performed at [place] [address], [place], [place] [address]    Culture   Final    NO GROWTH 3 DAYS Performed at [facility], [place] [address], [place], [place] [address]    Report Status PENDING  Incomplete  Culture, blood (routine x 2)     Status: None (Preliminary result)   Collection Time: 10/09/21  5:25 AM   Specimen: BLOOD LEFT HAND  Result Value Ref Range Status   Specimen Description   Final    BLOOD LEFT HAND Performed at [place] [address] [place], [place] [address]    Special Requests   Final    BOTTLES DRAWN AEROBIC ONLY Blood Culture adequate volume Performed at [place] [address], [place], [place] [address]    Culture   Final    NO GROWTH 3 DAYS Performed at [facility], [place] [address], [place],  [address]    Report Status PENDING  Incomplete     Time coordinating discharge: Over 30 minutes  SIGNED:   Shawna Clamp, MD  [facility] 10/12/2021, 2:26 PM Pager   If 7PM-7AM, please contact night-coverage

## 2021-10-12 NOTE — Anesthesia Postprocedure Evaluation (Signed)
Anesthesia Post Note  Patient: [name]  Procedure(s) Performed: ESOPHAGOGASTRODUODENOSCOPY (EGD) WITH PROPOFOL BIOPSY     Patient location during evaluation: PACU Anesthesia Type: MAC Level of consciousness: awake and alert Pain management: pain level controlled Vital Signs Assessment: post-procedure vital signs reviewed and stable Respiratory status: spontaneous breathing, nonlabored ventilation and respiratory function stable Cardiovascular status: stable and blood pressure returned to baseline Anesthetic complications: no   No notable events documented.  Last Vitals:  Vitals:   10/12/21 0850 10/12/21 0857  BP: (!) 132/50 125/64  Pulse: 67 64  Resp: (!) 21 13  Temp: 36.5 C   SpO2: 97% 96%    Last Pain:  Vitals:   10/12/21 0857  TempSrc:   PainSc: 0-No pain                 Audry Pili

## 2021-10-13 ENCOUNTER — Encounter (HOSPITAL_COMMUNITY): Payer: Self-pay | Admitting: Gastroenterology

## 2021-10-14 ENCOUNTER — Other Ambulatory Visit: Payer: Self-pay

## 2021-10-14 ENCOUNTER — Inpatient Hospital Stay: Payer: Medicare HMO | Admitting: Oncology

## 2021-10-14 ENCOUNTER — Inpatient Hospital Stay: Payer: Medicare HMO

## 2021-10-14 ENCOUNTER — Other Ambulatory Visit: Payer: Medicare HMO

## 2021-10-14 VITALS — BP 138/53 | HR 69 | Temp 97.6°F | Wt 136.2 lb

## 2021-10-14 DIAGNOSIS — C20 Malignant neoplasm of rectum: Secondary | ICD-10-CM | POA: Diagnosis not present

## 2021-10-14 DIAGNOSIS — Z95828 Presence of other vascular implants and grafts: Secondary | ICD-10-CM

## 2021-10-14 DIAGNOSIS — Z5111 Encounter for antineoplastic chemotherapy: Secondary | ICD-10-CM | POA: Diagnosis present

## 2021-10-14 DIAGNOSIS — C539 Malignant neoplasm of cervix uteri, unspecified: Secondary | ICD-10-CM | POA: Diagnosis not present

## 2021-10-14 DIAGNOSIS — D6481 Anemia due to antineoplastic chemotherapy: Secondary | ICD-10-CM | POA: Diagnosis not present

## 2021-10-14 DIAGNOSIS — D649 Anemia, unspecified: Secondary | ICD-10-CM

## 2021-10-14 DIAGNOSIS — N136 Pyonephrosis: Secondary | ICD-10-CM | POA: Diagnosis not present

## 2021-10-14 LAB — CMP (CANCER CENTER ONLY)
ALT: 7 U/L (ref 0–44)
AST: 11 U/L — ABNORMAL LOW (ref 15–41)
Albumin: 2.9 g/dL — ABNORMAL LOW (ref 3.5–5.0)
Alkaline Phosphatase: 141 U/L — ABNORMAL HIGH (ref 38–126)
Anion gap: 10 (ref 5–15)
BUN: 15 mg/dL (ref 8–23)
CO2: 20 mmol/L — ABNORMAL LOW (ref 22–32)
Calcium: 8.9 mg/dL (ref 8.9–10.3)
Chloride: 107 mmol/L (ref 98–111)
Creatinine: 0.56 mg/dL (ref 0.44–1.00)
GFR, Estimated: >60 mL/min (ref >60)
Glucose, Bld: 143 mg/dL — ABNORMAL HIGH (ref 70–99)
Potassium: 4.4 mmol/L (ref 3.5–5.1)
Sodium: 137 mmol/L (ref 135–145)
Total Bilirubin: 0.5 mg/dL (ref 0.3–1.2)
Total Protein: 5.5 g/dL — ABNORMAL LOW (ref 6.5–8.1)

## 2021-10-14 LAB — SAMPLE TO BLOOD BANK

## 2021-10-14 LAB — CBC WITH DIFFERENTIAL (CANCER CENTER ONLY)
Abs Immature Granulocytes: 0.1 10*3/uL — ABNORMAL HIGH (ref 0.00–0.07)
Basophils Absolute: 0.1 10*3/uL (ref 0.0–0.1)
Basophils Relative: 1 %
Eosinophils Absolute: 0 10*3/uL (ref 0.0–0.5)
Eosinophils Relative: 0 %
HCT: 24.1 % — ABNORMAL LOW (ref 36.0–46.0)
Hemoglobin: 7.5 g/dL — ABNORMAL LOW (ref 12.0–15.0)
Immature Granulocytes: 1 %
Lymphocytes Relative: 13 %
Lymphs Abs: 1.2 10*3/uL (ref 0.7–4.0)
MCH: 29.9 pg (ref 26.0–34.0)
MCHC: 31.1 g/dL (ref 30.0–36.0)
MCV: 96 fL (ref 80.0–100.0)
Monocytes Absolute: 1 10*3/uL (ref 0.1–1.0)
Monocytes Relative: 11 %
Neutro Abs: 6.7 10*3/uL (ref 1.7–7.7)
Neutrophils Relative %: 74 %
Platelet Count: 182 10*3/uL (ref 150–400)
RBC: 2.51 MIL/uL — ABNORMAL LOW (ref 3.87–5.11)
RDW: 23.5 % — ABNORMAL HIGH (ref 11.5–15.5)
WBC Count: 9.2 10*3/uL (ref 4.0–10.5)
nRBC: 0 % (ref 0.0–0.2)

## 2021-10-14 LAB — MAGNESIUM: Magnesium: 1.3 mg/dL — ABNORMAL LOW (ref 1.7–2.4)

## 2021-10-14 LAB — CULTURE, BLOOD (ROUTINE X 2)
Culture: NO GROWTH
Culture: NO GROWTH
Special Requests: ADEQUATE
Special Requests: ADEQUATE

## 2021-10-14 MED ORDER — MAGNESIUM OXIDE -MG SUPPLEMENT 400 (240 MG) MG PO TABS: 400.0000 mg | ORAL_TABLET | Freq: Two times a day (BID) | ORAL | 0 refills | Status: AC

## 2021-10-14 MED ORDER — SODIUM CHLORIDE 0.9% FLUSH
10.0000 mL | Freq: Once | INTRAVENOUS | Status: AC
  Administered 2021-10-14: 14:00:00 10 mL via INTRAVENOUS

## 2021-10-14 MED ORDER — HEPARIN SOD (PORK) LOCK FLUSH 100 UNIT/ML IV SOLN
500.0000 [IU] | Freq: Once | INTRAVENOUS | Status: AC
  Administered 2021-10-14: 14:00:00 500 [IU] via INTRAVENOUS

## 2021-10-14 NOTE — Patient Instructions (Signed)
Implanted [facility], [place] Guide An implanted port is a device that is placed under the skin. It is usually placed in the chest. The device may vary based on the need. Implanted ports can be used to give IV medicine, to take blood, or to give fluids. You may have an implanted port if: You need IV medicine that would be irritating to the small veins in your hands or arms. You need IV medicines, such as chemotherapy, for a long period of time. You need IV nutrition for a long period of time. You may have fewer limitations when using a port than you would if you used other types of long-term IVs. You will also likely be able to return to normal activities after your incision heals. An implanted port has two main parts: Reservoir. The reservoir is the part where a needle is inserted to give medicines or draw blood. The reservoir is round. After the port is placed, it appears as a small, raised area under your skin. Catheter. The catheter is a small, thin tube that connects the reservoir to a vein. Medicine that is inserted into the reservoir goes into the catheter and then into the vein. How is my port accessed? To access your port: A numbing cream may be placed on the skin over the port site. Your health care provider will put on a mask and sterile gloves. The skin over your port will be cleaned carefully with a germ-killing soap and allowed to dry. Your health care provider will gently pinch the port and insert a needle into it. Your health care provider will check for a blood return to make sure the port is in the vein and is still working (patent). If your port needs to remain accessed to get medicine continuously (constant infusion), your health care provider will place a clear bandage (dressing) over the needle site. The dressing and needle will need to be changed every week, or as told by your health care provider. What is flushing? Flushing helps keep the port working. Follow instructions from your  health care provider about how and when to flush the port. Ports are usually flushed with saline solution or a medicine called heparin. The need for flushing will depend on how the port is used: If the port is only used from time to time to give medicines or draw blood, the port may need to be flushed: Before and after medicines have been given. Before and after blood has been drawn. As part of routine maintenance. Flushing may be recommended every 4-6 weeks. If a constant infusion is running, the port may not need to be flushed. Throw away any syringes in a disposal container that is meant for sharp items (sharps container). You can buy a sharps container from a pharmacy, or you can make one by using an empty hard plastic bottle with a cover. How long will my port stay implanted? The port can stay in for as long as your health care provider thinks it is needed. When it is time for the port to come out, a surgery will be done to remove it. The surgery will be similar to the procedure that was done to put the port in. Follow these instructions at home: Caring for your port and port site Flush your port as told by your health care provider. If you need an infusion over several days, follow instructions from your health care provider about how to take care of your port site. Make sure you: Change your  dressing as told by your health care provider. Wash your hands with soap and water for at least 20 seconds before and after you change your dressing. If soap and water are not available, use alcohol-based hand sanitizer. Place any used dressings or infusion bags into a plastic bag. Throw that bag in the trash. Keep the dressing that covers the needle clean and dry. Do not get it wet. Do not use scissors or sharp objects near the infusion tubing. Keep any external tubes clamped, unless they are being used. Check your port site every day for signs of infection. Check for: Redness, swelling, or  pain. Fluid or blood. Warmth. Pus or a bad smell. Protect the skin around the port site. Avoid wearing bra straps that rub or irritate the site. Protect the skin around your port from seat belts. Place a soft pad over your chest if needed. Bathe or shower as told by your health care provider. The site may get wet as long as you are not actively receiving an infusion. General instructions  Return to your normal activities as told by your health care provider. Ask your health care provider what activities are safe for you. Carry a medical alert card or wear a medical alert bracelet at all times. This will let health care providers know that you have an implanted port in case of an emergency. Where to find more information American Cancer Society: www.cancer.[place] of Clinical Oncology: www.cancer.net Contact a health care provider if: You have a fever or chills. You have redness, swelling, or pain at the port site. You have fluid or blood coming from your port site. Your incision feels warm to the touch. You have pus or a bad smell coming from the port site. Summary Implanted ports are usually placed in the chest for long-term IV access. Follow instructions from your health care provider about flushing the port and changing bandages (dressings). Take care of the area around your port by avoiding clothing that puts pressure on the area, and by watching for signs of infection. Protect the skin around your port from seat belts. Place a soft pad over your chest if needed. Contact a health care provider if you have a fever or you have redness, swelling, pain, fluid, or a bad smell at the port site. This information is not intended to replace advice given to you by your health care provider. Make sure you discuss any questions you have with your health care provider. Document Revised: 05/07/2021 Document Reviewed: 05/07/2021 Elsevier Patient Education  [place].

## 2021-10-14 NOTE — Progress Notes (Signed)
[name] OFFICE PROGRESS NOTE   Diagnosis: Rectal cancer  INTERVAL HISTORY:   Ms. Marut returns as scheduled.  She was admitted last week with altered mental status and nausea.  She was diagnosed with a Pseudomonas urinary tract infection.  She underwent an upper endoscopy to evaluate the nausea and duodenal wall thickening noted on a CT 10/01/2021.  The upper endoscopy revealed nonbleeding gastric and duodenal ulcers.  Biopsy results are pending. Ms. Bells was discharged on 10/12/2021.  No complaint today.  No peripheral numbness.  The foot ulcers are healing.  Objective:  Vital signs in last 24 hours:  Blood pressure (!) 138/53, pulse 69, temperature 97.6 F (36.4 C), temperature source Tympanic, weight 136 lb 3.2 oz (61.8 kg), SpO2 98 %.   Resp: Lungs clear bilaterally Cardio: Regular rate and rhythm GI: No hepatosplenomegaly, left lower quadrant colostomy with dark stool Vascular: No leg edema  Skin: Bandage in place of the right foot ulcers  Portacath/PICC-without erythema  Lab Results:  Lab Results  Component Value Date   WBC 9.2 10/14/2021   HGB 7.5 (L) 10/14/2021   HCT 24.1 (L) 10/14/2021   MCV 96.0 10/14/2021   PLT 182 10/14/2021   NEUTROABS 6.7 10/14/2021    CMP  Lab Results  Component Value Date   NA 137 10/14/2021   K 4.4 10/14/2021   CL 107 10/14/2021   CO2 20 (L) 10/14/2021   GLUCOSE 143 (H) 10/14/2021   BUN 15 10/14/2021   CREATININE 0.56 10/14/2021   CALCIUM 8.9 10/14/2021   PROT 5.5 (L) 10/14/2021   ALBUMIN 2.9 (L) 10/14/2021   AST 11 (L) 10/14/2021   ALT 7 10/14/2021   ALKPHOS 141 (H) 10/14/2021   BILITOT 0.5 10/14/2021   GFRNONAA >60 10/14/2021   GFRAA >60 03/29/2020    Lab Results  Component Value Date   CEA1 28.60 (H) 06/12/2021   CEA 9.96 (H) 09/02/2021    Lab Results  Component Value Date   INR 1.2 09/05/2021   LABPROT 15.4 (H) 09/05/2021    Imaging:  No results found.  Medications: I have reviewed  the patient's current medications.   Assessment/Plan: Rectal cancer Partially obstructing mass beginning at 10 cm from the anal verge on colonoscopy 01/23/2021, biopsy-adenocarcinoma, immunohistochemical stains at [facility]20, CDX-2, and SATB2 positive, patchy strong staining for p16.  The rectal tumor has a distinct immunohistochemical profile suggesting a primary colorectal tumor as opposed to metastatic cervical cancer. CT abdomen/pelvis 01/18/2021-bilateral hydronephrosis, endometrial fluid collection, prominent stool in the colon MRI pelvis 01/23/2021-tumor at 7.6 cm from the anal verge, abnormal signal bridge in the cervix, mesorectum, and anterior aspect of the rectum-similar to MRI from 2020, MRI stage could not be defined secondary to posttreatment changes in the pelvis 03/18/2021-total abdominal hysterectomy, cystoscopy with insertion of ureteral stents, exploratory laparotomy, aborted low anterior resection, descending loop colostomy--right and left fallopian tubes and ovaries negative for malignancy; excision portion of cervix positive for adenocarcinoma; uterus with invasive moderately differentiated adenocarcinoma of the cervix, HPV associated, invading through full-thickness of the cervix/lower uterine segment and into parametrial tissue, margins of resection received disrupted, cannot be evaluated; remaining uterus with extensive endocervical adenocarcinoma in situ involving lower uterine segment and replacing endometrium.  This carcinoma was felt to be a separate primary from the rectal tumor. PET scan at [facility] 04/11/2021-intense FDG activity in region of known rectal mass.  Hypermetabolic left external iliac and right inguinal lymph nodes.  Minimal FDG uptake and small right supraclavicular lymph node.  No uptake seen in the cervix. 04/23/2021 FNA right inguinal lymph node-negative for malignancy Evaluated by Dr. Leamon Arnt 05/21/2021, 05/28/2021-appears to have 2 synchronous malignancies (cervical  adenocarcinoma and rectal adenocarcinoma); further surgery which would entail pelvic exenteration not recommended by gynecologic oncology or colorectal surgery.  Full doses of radiotherapy not felt to be feasible.  Recommended 3 months of CAPOX and then repeat MRI of abdomen/pelvis, chest CT and colonoscopy. Cycle 1 Xeloda 06/13/2021 Cycle 1 FOLFOX 07/08/2021, oxaliplatin dose reduced secondary to pre-existing neuropathy Cycle 2 FOLFOX 07/29/2021, Udenyca Cycle 3 FOLFOX 08/12/2021, Udenyca Cycle 4 FOLFOX 09/02/2021, Udenyca Cycle 5 FOLFOX 09/17/2021, Udenyca CTs at [facility] 10/01/2021-unchanged findings of rectal malignancy status posttreatment.  Marked thickening of the proximal duodenal wall.  Unchanged bilateral inguinal lymph nodes which are not enlarged but avid on prior PET.  Unchanged diffuse bladder wall thickening and perivesicular stranding which may represent radiation cystitis.  New mild right lower lobe bronchial wall thickening, mucus impaction and tree-in-bud pulmonary nodules.     Bilateral hydronephrosis-secondary to urinary retention? Hematometra found on pelvic MRI 01/23/2021, evaluated by gynecologic oncology Cervical cancer November 1994, stage Ib, treated with external beam radiation and brachytherapy at [facility] Diabetes Neuropathy Chronic pain secondary to #6 Recurrent urinary tract infections History of a CVA History of hyperthyroidism G3, P3 COVID-19 infection January 2021 Admission 08/25/2021 with a urinary tract infection-culture negative Anemia secondary to chronic disease, chemotherapy, and rectal bleeding-1 unit packed red blood cells 08/26/2021 Hospital admission 09/23/2021-symptomatic anemia 16.  Admission 10/08/2021 with altered mental status, nausea/vomiting, and left foot ulcers Urine culture 10/08/2021 positive for Pseudomonas aeruginosa EGD 10/12/2021-gastric and duodenal ulcers-not bleeding     Disposition: Ms. Mcnulty appears stable.  Her clinical status is much  improved compared to when she was admitted to the hospital last week with a Pseudomonas urinary tract infection.  An upper endoscopy 10/12/2021 revealed nonbleeding gastric and duodenal ulcers.  She is now maintained on pantoprazole.  Ms. Frein underwent restaging at [facility] on 10/01/2021.  No evidence of disease progression with stable rectal soft tissue thickening.  Dr. Leamon Arnt present her case to the surgeons at [facility].  He reports surgery is not recommended.  He recommends observation versus proceeding with a few more cycles of FOLFOX and considering maintenance therapy.  I discussed these options with Ms. Jahr.  Her daughter was present by telephone for today's visit.  She is scheduled for a telephone visit with gynecologic oncology on 10/17/2021.  Ms. Tessler is inclined to proceed with further chemotherapy in an attempt to delay disease progression.  She will return for an office visit with the plan to continue FOLFOX on 10/22/2021.  She will begin a magnesium supplement.  We will administer IV magnesium if the level is low again next week.  Betsy Coder, MD  10/14/2021  3:58 PM

## 2021-10-15 ENCOUNTER — Ambulatory Visit: Payer: Medicare HMO | Admitting: Podiatry

## 2021-10-16 ENCOUNTER — Other Ambulatory Visit: Payer: Self-pay

## 2021-10-16 ENCOUNTER — Telehealth: Payer: Self-pay

## 2021-10-16 DIAGNOSIS — Z79891 Long term (current) use of opiate analgesic: Secondary | ICD-10-CM | POA: Diagnosis not present

## 2021-10-16 DIAGNOSIS — M545 Low back pain, unspecified: Secondary | ICD-10-CM | POA: Diagnosis not present

## 2021-10-16 DIAGNOSIS — G894 Chronic pain syndrome: Secondary | ICD-10-CM | POA: Diagnosis not present

## 2021-10-16 DIAGNOSIS — E039 Hypothyroidism, unspecified: Secondary | ICD-10-CM | POA: Diagnosis not present

## 2021-10-16 DIAGNOSIS — M5440 Lumbago with sciatica, unspecified side: Secondary | ICD-10-CM | POA: Diagnosis not present

## 2021-10-16 DIAGNOSIS — E089 Diabetes mellitus due to underlying condition without complications: Secondary | ICD-10-CM | POA: Diagnosis not present

## 2021-10-16 DIAGNOSIS — M5417 Radiculopathy, lumbosacral region: Secondary | ICD-10-CM | POA: Diagnosis not present

## 2021-10-16 DIAGNOSIS — R69 Illness, unspecified: Secondary | ICD-10-CM | POA: Diagnosis not present

## 2021-10-16 DIAGNOSIS — M791 Myalgia, unspecified site: Secondary | ICD-10-CM | POA: Diagnosis not present

## 2021-10-16 DIAGNOSIS — G8929 Other chronic pain: Secondary | ICD-10-CM | POA: Diagnosis not present

## 2021-10-16 MED ORDER — FLUCONAZOLE 100 MG PO TABS: 100.0000 mg | ORAL_TABLET | Freq: Every day | ORAL | 0 refills | Status: DC

## 2021-10-16 NOTE — Telephone Encounter (Signed)
-----   Message from [place], MD sent at 10/12/2021  8:42 AM EST ----- This patient needs hospital follow-up with an APP in 2-3 weeks. Repeat EGD in 3 months.  Thanks!  KLB

## 2021-10-16 NOTE — Telephone Encounter (Signed)
Per Dr. Tarri Glenn request, pt called and has been scheduled for hosp f/u on 10/31/21 @ 2pm.

## 2021-10-17 DIAGNOSIS — C538 Malignant neoplasm of overlapping sites of cervix uteri: Secondary | ICD-10-CM | POA: Diagnosis not present

## 2021-10-18 LAB — SURGICAL PATHOLOGY

## 2021-10-22 ENCOUNTER — Encounter: Payer: Self-pay | Admitting: Nurse Practitioner

## 2021-10-22 ENCOUNTER — Inpatient Hospital Stay: Payer: Medicare HMO | Attending: Nurse Practitioner | Admitting: Nurse Practitioner

## 2021-10-22 ENCOUNTER — Other Ambulatory Visit: Payer: Self-pay

## 2021-10-22 ENCOUNTER — Inpatient Hospital Stay: Payer: Medicare HMO

## 2021-10-22 VITALS — BP 137/59 | HR 88 | Temp 97.8°F | Resp 18 | Ht 60.0 in | Wt 134.0 lb

## 2021-10-22 VITALS — BP 136/50 | HR 56 | Temp 97.8°F | Resp 18

## 2021-10-22 DIAGNOSIS — C20 Malignant neoplasm of rectum: Secondary | ICD-10-CM | POA: Insufficient documentation

## 2021-10-22 DIAGNOSIS — Z5111 Encounter for antineoplastic chemotherapy: Secondary | ICD-10-CM | POA: Insufficient documentation

## 2021-10-22 DIAGNOSIS — Z23 Encounter for immunization: Secondary | ICD-10-CM | POA: Insufficient documentation

## 2021-10-22 DIAGNOSIS — D6481 Anemia due to antineoplastic chemotherapy: Secondary | ICD-10-CM | POA: Insufficient documentation

## 2021-10-22 LAB — CMP (CANCER CENTER ONLY)
ALT: 7 U/L (ref 0–44)
AST: 12 U/L — ABNORMAL LOW (ref 15–41)
Albumin: 2.9 g/dL — ABNORMAL LOW (ref 3.5–5.0)
Alkaline Phosphatase: 158 U/L — ABNORMAL HIGH (ref 38–126)
Anion gap: 12 (ref 5–15)
BUN: 11 mg/dL (ref 8–23)
CO2: 23 mmol/L (ref 22–32)
Calcium: 9 mg/dL (ref 8.9–10.3)
Chloride: 107 mmol/L (ref 98–111)
Creatinine: 0.78 mg/dL (ref 0.44–1.00)
GFR, Estimated: >60 mL/min (ref >60)
Glucose, Bld: 184 mg/dL — ABNORMAL HIGH (ref 70–99)
Potassium: 4.4 mmol/L (ref 3.5–5.1)
Sodium: 142 mmol/L (ref 135–145)
Total Bilirubin: 0.5 mg/dL (ref 0.3–1.2)
Total Protein: 5.7 g/dL — ABNORMAL LOW (ref 6.5–8.1)

## 2021-10-22 LAB — CBC WITH DIFFERENTIAL (CANCER CENTER ONLY)
Abs Immature Granulocytes: 0.05 10*3/uL (ref 0.00–0.07)
Basophils Absolute: 0 10*3/uL (ref 0.0–0.1)
Basophils Relative: 0 %
Eosinophils Absolute: 0 10*3/uL (ref 0.0–0.5)
Eosinophils Relative: 0 %
HCT: 28.3 % — ABNORMAL LOW (ref 36.0–46.0)
Hemoglobin: 8.5 g/dL — ABNORMAL LOW (ref 12.0–15.0)
Immature Granulocytes: 1 %
Lymphocytes Relative: 9 %
Lymphs Abs: 0.8 10*3/uL (ref 0.7–4.0)
MCH: 31 pg (ref 26.0–34.0)
MCHC: 30 g/dL (ref 30.0–36.0)
MCV: 103.3 fL — ABNORMAL HIGH (ref 80.0–100.0)
Monocytes Absolute: 0.4 10*3/uL (ref 0.1–1.0)
Monocytes Relative: 5 %
Neutro Abs: 7.9 10*3/uL — ABNORMAL HIGH (ref 1.7–7.7)
Neutrophils Relative %: 85 %
Platelet Count: 309 10*3/uL (ref 150–400)
RBC: 2.74 MIL/uL — ABNORMAL LOW (ref 3.87–5.11)
RDW: 22.7 % — ABNORMAL HIGH (ref 11.5–15.5)
WBC Count: 9.3 10*3/uL (ref 4.0–10.5)
nRBC: 0 % (ref 0.0–0.2)

## 2021-10-22 LAB — MAGNESIUM: Magnesium: 1.4 mg/dL — ABNORMAL LOW (ref 1.7–2.4)

## 2021-10-22 LAB — SAMPLE TO BLOOD BANK

## 2021-10-22 MED ORDER — OXALIPLATIN CHEMO INJECTION 100 MG/20ML
55.0000 mg/m2 | Freq: Once | INTRAVENOUS | Status: AC
  Administered 2021-10-22: 90 mg via INTRAVENOUS
  Filled 2021-10-22: qty 18

## 2021-10-22 MED ORDER — MAGNESIUM SULFATE 2 GM/50ML IV SOLN
2.0000 g | Freq: Once | INTRAVENOUS | Status: AC
  Administered 2021-10-22: 2 g via INTRAVENOUS
  Filled 2021-10-22: qty 50

## 2021-10-22 MED ORDER — DEXTROSE 5 % IV SOLN: Freq: Once | INTRAVENOUS | Status: AC

## 2021-10-22 MED ORDER — LEUCOVORIN CALCIUM INJECTION 350 MG
400.0000 mg/m2 | Freq: Once | INTRAVENOUS | Status: AC
  Administered 2021-10-22: 648 mg via INTRAVENOUS
  Filled 2021-10-22: qty 17.5

## 2021-10-22 MED ORDER — SODIUM CHLORIDE 0.9 % IV SOLN
2000.0000 mg/m2 | INTRAVENOUS | Status: DC
  Administered 2021-10-22: 3250 mg via INTRAVENOUS
  Filled 2021-10-22: qty 65

## 2021-10-22 MED ORDER — FLUOROURACIL CHEMO INJECTION 2.5 GM/50ML
400.0000 mg/m2 | Freq: Once | INTRAVENOUS | Status: AC
  Administered 2021-10-22: 650 mg via INTRAVENOUS
  Filled 2021-10-22: qty 13

## 2021-10-22 MED ORDER — PALONOSETRON HCL INJECTION 0.25 MG/5ML
0.2500 mg | Freq: Once | INTRAVENOUS | Status: AC
  Administered 2021-10-22: 0.25 mg via INTRAVENOUS
  Filled 2021-10-22: qty 5

## 2021-10-22 MED ORDER — SODIUM CHLORIDE 0.9 % IV SOLN
10.0000 mg | Freq: Once | INTRAVENOUS | Status: AC
  Administered 2021-10-22: 10 mg via INTRAVENOUS
  Filled 2021-10-22: qty 1

## 2021-10-22 NOTE — Progress Notes (Addendum)
[name]   Diagnosis: Rectal cancer  INTERVAL HISTORY:   [name] returns as scheduled.  She is scheduled to resume treatment today with FOLFOX.  [name] feels she is ready to resume treatment.  [name] and [name] agree.  She denies nausea.  She reports a single mouth sore.  No diarrhea.  She continues to [name] rectal bleeding.  No numbness or tingling in the hands or feet.  [name] reports 2 falls.  1 occurred when she bent over to pick something up off of the floor.  The second fall occurred when [name] feet became "twisted up".  Objective:  Vital signs in last 24 hours:  Blood pressure (!) 137/59, pulse 88, temperature 97.8 F (36.6 C), temperature source Oral, resp. rate 18, height 5' (1.524 m), weight 134 lb (60.8 kg), SpO2 100 %.    HEENT: No thrush or ulcers. Resp: Lungs clear bilaterally. Cardio: Regular rate and rhythm. GI: No hepatomegaly.  Left lower quadrant colostomy.  No stool in the collection bag. Vascular: No significant leg edema. Skin: Right upper sacral region with mild erythema/dry skin. Port-A-Cath without erythema.   Lab Results:  Lab Results  Component Value Date   WBC 9.3 10/22/2021   HGB 8.5 (L) 10/22/2021   HCT 28.3 (L) 10/22/2021   MCV 103.3 (H) 10/22/2021   PLT 309 10/22/2021   NEUTROABS 7.9 (H) 10/22/2021    Imaging:  No results found.  Medications: I have reviewed the patient's current medications.  Assessment/Plan: Rectal cancer Partially obstructing mass beginning at 10 cm from the anal verge on colonoscopy 01/23/2021, biopsy-adenocarcinoma, immunohistochemical stains at [facility], CDX-2, and SATB2 positive, patchy strong staining for p16.  The rectal tumor has a distinct immunohistochemical profile suggesting a primary colorectal tumor as opposed to metastatic cervical cancer. CT abdomen/pelvis 01/18/2021-bilateral hydronephrosis, endometrial fluid collection, prominent stool in the colon MRI  pelvis 01/23/2021-tumor at 7.6 cm from the anal verge, abnormal signal bridge in the cervix, mesorectum, and anterior aspect of the rectum-similar to MRI from 2020, MRI stage could not be defined secondary to posttreatment changes in the pelvis 03/18/2021-total abdominal hysterectomy, cystoscopy with insertion of ureteral stents, exploratory laparotomy, aborted low anterior resection, descending loop colostomy--right and left fallopian tubes and ovaries negative for malignancy; excision portion of cervix positive for adenocarcinoma; uterus with invasive moderately differentiated adenocarcinoma of the cervix, HPV associated, invading through full-thickness of the cervix/lower uterine segment and into parametrial tissue, margins of resection received disrupted, cannot be evaluated; remaining uterus with extensive endocervical adenocarcinoma in situ involving lower uterine segment and replacing endometrium.  This carcinoma was felt to be a separate primary from the rectal tumor. PET scan at [facility] 04/11/2021-intense FDG activity in region of known rectal mass.  Hypermetabolic left external iliac and right inguinal lymph nodes.  Minimal FDG uptake and small right supraclavicular lymph node.  No uptake seen in the cervix. 04/23/2021 FNA right inguinal lymph node-negative for malignancy Evaluated by Dr. Leamon Arnt 05/21/2021, 05/28/2021-appears to have 2 synchronous malignancies (cervical adenocarcinoma and rectal adenocarcinoma); further surgery which would entail pelvic exenteration not recommended by gynecologic oncology or colorectal surgery.  Full doses of radiotherapy not felt to be feasible.  Recommended 3 months of CAPOX and then repeat MRI of abdomen/pelvis, chest CT and colonoscopy. Cycle 1 Xeloda 06/13/2021 Cycle 1 FOLFOX 07/08/2021, oxaliplatin dose reduced secondary to pre-existing neuropathy Cycle 2 FOLFOX 07/29/2021, Udenyca Cycle 3 FOLFOX 08/12/2021, Udenyca Cycle 4 FOLFOX 09/02/2021, Udenyca Cycle 5 FOLFOX  09/17/2021,  Udenyca CTs at [facility] 10/01/2021-unchanged findings of rectal malignancy status posttreatment.  Marked thickening of the proximal duodenal wall.  Unchanged bilateral inguinal lymph nodes which are not enlarged but avid on prior PET.  Unchanged diffuse bladder wall thickening and perivesicular stranding which may represent radiation cystitis.  New mild right lower lobe bronchial wall thickening, mucus impaction and tree-in-bud pulmonary nodules. Cycle 6 FOLFOX 10/22/2021, Udenyca     Bilateral hydronephrosis-secondary to urinary retention? Hematometra found on pelvic MRI 01/23/2021, evaluated by gynecologic oncology Cervical cancer November 1994, stage Ib, treated with external beam radiation and brachytherapy at [facility] Diabetes Neuropathy Chronic pain secondary to #6 Recurrent urinary tract infections History of a CVA History of hyperthyroidism G3, P3 COVID-19 infection January 2021 Admission 08/25/2021 with a urinary tract infection-culture negative Anemia secondary to chronic disease, chemotherapy, and rectal bleeding-1 unit packed red blood cells 08/26/2021 Hospital admission 09/23/2021-symptomatic anemia 16.  Admission 10/08/2021 with altered mental status, nausea/vomiting, and left foot ulcers Urine culture 10/08/2021 positive for Pseudomonas aeruginosa EGD 10/12/2021-gastric and duodenal ulcers-not bleeding    Disposition: [name] appears unchanged.  She has completed 5 cycles of FOLFOX.  Plan to proceed with cycle 6 today as scheduled.  CBC from today reviewed.  Counts adequate to proceed as above.  Hemoglobin is better.  She and [name] understand to contact the office with symptoms suggestive of progressive anemia.  She will return for lab, follow-up, FOLFOX in 2 weeks.  We are available to see [name] sooner if needed.  Plan reviewed with Dr. Benay Spice.     Ned Card ANP/GNP-BC   10/22/2021  9:03 AM

## 2021-10-22 NOTE — Progress Notes (Signed)
Patient presents for treatment. RN assessment completed along with the following:  Labs/vitals reviewed - Yes, and within treatment parameters.   Weight within 10% of previous measurement - Yes Oncology Treatment Attestation completed for current therapy- Yes, on date 07/01/21 Informed consent completed and reflects current therapy/intent - Yes, on date 07/08/21             Provider progress note reviewed - Today's physician note note available. Most recent note from 10/14/21 reviewed.  Treatment/Antibody/Supportive plan reviewed - Yes, and there are no adjustments needed for today's treatment. S&H and other orders reviewed - Yes, and there are no additional orders identified. Previous treatment date reviewed - Yes, and the appropriate amount of time has elapsed between treatments. Clinic Hand Off Received from - Ned Card, NP  Patient to proceed with treatment.

## 2021-10-22 NOTE — Progress Notes (Signed)
Patient seen by Ned Card NP today  Vitals are within treatment parameters.  Labs reviewed by Ned Card NP and are within treatment parameters.  Per physician team, patient is ready for treatment and there are NO modifications to the treatment plan. Will give 2 grams IV Mg+ today as well.

## 2021-10-22 NOTE — Patient Instructions (Signed)
Implanted [facility] Guide An implanted port is a device that is placed under the skin. It is usually placed in the chest. The device may vary based on the need. Implanted ports can be used to give IV medicine, to take blood, or to give fluids. You may have an implanted port if: You need IV medicine that would be irritating to the small veins in your hands or arms. You need IV medicines, such as chemotherapy, for a long period of time. You need IV nutrition for a long period of time. You may have fewer limitations when using a port than you would if you used other types of long-term IVs. You will also likely be able to return to normal activities after your incision heals. An implanted port has two main parts: Reservoir. The reservoir is the part where a needle is inserted to give medicines or draw blood. The reservoir is round. After the port is placed, it appears as a small, raised area under your skin. Catheter. The catheter is a small, thin tube that connects the reservoir to a vein. Medicine that is inserted into the reservoir goes into the catheter and then into the vein. How is my port accessed? To access your port: A numbing cream may be placed on the skin over the port site. Your health care provider will put on a mask and sterile gloves. The skin over your port will be cleaned carefully with a germ-killing soap and allowed to dry. Your health care provider will gently pinch the port and insert a needle into it. Your health care provider will check for a blood return to make sure the port is in the vein and is still working (patent). If your port needs to remain accessed to get medicine continuously (constant infusion), your health care provider will place a clear bandage (dressing) over the needle site. The dressing and needle will need to be changed every week, or as told by your health care provider. What is flushing? Flushing helps keep the port working. Follow instructions from your  health care provider about how and when to flush the port. Ports are usually flushed with saline solution or a medicine called heparin. The need for flushing will depend on how the port is used: If the port is only used from time to time to give medicines or draw blood, the port may need to be flushed: Before and after medicines have been given. Before and after blood has been drawn. As part of routine maintenance. Flushing may be recommended every 4-6 weeks. If a constant infusion is running, the port may not need to be flushed. Throw away any syringes in a disposal container that is meant for sharp items (sharps container). You can buy a sharps container from a pharmacy, or you can make one by using an empty hard plastic bottle with a cover. How long will my port stay implanted? The port can stay in for as long as your health care provider thinks it is needed. When it is time for the port to come out, a surgery will be done to remove it. The surgery will be similar to the procedure that was done to put the port in. Follow these instructions at home: Caring for your port and port site Flush your port as told by your health care provider. If you need an infusion over several days, follow instructions from your health care provider about how to take care of your port site. Make sure you: Change your  dressing as told by your health care provider. Wash your hands with soap and water for at least 20 seconds before and after you change your dressing. If soap and water are not available, use alcohol-based hand sanitizer. Place any used dressings or infusion bags into a plastic bag. Throw that bag in the trash. Keep the dressing that covers the needle clean and dry. Do not get it wet. Do not use scissors or sharp objects near the infusion tubing. Keep any external tubes clamped, unless they are being used. Check your port site every day for signs of infection. Check for: Redness, swelling, or  pain. Fluid or blood. Warmth. Pus or a bad smell. Protect the skin around the port site. Avoid wearing bra straps that rub or irritate the site. Protect the skin around your port from seat belts. Place a soft pad over your chest if needed. Bathe or shower as told by your health care provider. The site may get wet as long as you are not actively receiving an infusion. General instructions  Return to your normal activities as told by your health care provider. Ask your health care provider what activities are safe for you. Carry a medical alert card or wear a medical alert bracelet at all times. This will let health care providers know that you have an implanted port in case of an emergency. Where to find more information [employer]: www.cancer.[place] [employer]: www.cancer.net Contact a health care provider if: You have a fever or chills. You have redness, swelling, or pain at the port site. You have fluid or blood coming from your port site. Your incision feels warm to the touch. You have pus or a bad smell coming from the port site. Summary Implanted ports are usually placed in the chest for long-term IV access. Follow instructions from your health care provider about flushing the port and changing bandages (dressings). Take care of the area around your port by avoiding clothing that puts pressure on the area, and by watching for signs of infection. Protect the skin around your port from seat belts. Place a soft pad over your chest if needed. Contact a health care provider if you have a fever or you have redness, swelling, pain, fluid, or a bad smell at the port site. This information is not intended to replace advice given to you by your health care provider. Make sure you discuss any questions you have with your health care provider. Document Revised: 05/07/2021 Document Reviewed: 05/07/2021 Elsevier Patient Education  [place].

## 2021-10-22 NOTE — Patient Instructions (Signed)
[facility]  Discharge Instructions: Thank you for choosing [facility] to provide your oncology and hematology care.   If you have a lab appointment with the [facility], please go directly to the [facility] and check in at the registration area.   Wear comfortable clothing and clothing appropriate for easy access to any Portacath or PICC line.   We strive to give you quality time with your provider. You may need to reschedule your appointment if you arrive late (15 or more minutes).  Arriving late affects you and other patients whose appointments are after yours.  Also, if you miss three or more appointments without notifying the office, you may be dismissed from the clinic at the provider's discretion.      For prescription refill requests, have your pharmacy contact our office and allow 72 hours for refills to be completed.    Today you received the following chemotherapy and/or immunotherapy agents oxaliplatin, leucovorin, fluorouracil      To help prevent nausea and vomiting after your treatment, we encourage you to take your nausea medication as directed.  BELOW ARE SYMPTOMS THAT SHOULD BE REPORTED IMMEDIATELY: *FEVER GREATER THAN 100.4 F (38 C) OR HIGHER *CHILLS OR SWEATING *NAUSEA AND VOMITING THAT IS NOT CONTROLLED WITH YOUR NAUSEA MEDICATION *UNUSUAL SHORTNESS OF BREATH *UNUSUAL BRUISING OR BLEEDING *URINARY PROBLEMS (pain or burning when urinating, or frequent urination) *BOWEL PROBLEMS (unusual diarrhea, constipation, pain near the anus) TENDERNESS IN MOUTH AND THROAT WITH OR WITHOUT PRESENCE OF ULCERS (sore throat, sores in mouth, or a toothache) UNUSUAL RASH, SWELLING OR PAIN  UNUSUAL VAGINAL DISCHARGE OR ITCHING   Items with * indicate a potential emergency and should be followed up as soon as possible or go to the Emergency Department if any problems should occur.  Please show the CHEMOTHERAPY ALERT CARD or IMMUNOTHERAPY  ALERT CARD at check-in to the Emergency Department and triage nurse.  Should you have questions after your visit or need to cancel or reschedule your appointment, please contact [facility]  Dept: [phone]  and follow the prompts.  Office hours are 8:00 a.m. to 4:30 p.m. Monday - Friday. Please note that voicemails left after 4:00 p.m. may not be returned until the following business day.  We are closed weekends and major holidays. You have access to a nurse at all times for urgent questions. Please call the main number to the clinic Dept: [phone] and follow the prompts.   For any non-urgent questions, you may also contact your provider using MyChart. We now offer e-Visits for anyone 42 and older to request care online for non-urgent symptoms. For details visit [email or web address].   Also download the MyChart app! Go to the app store, search "MyChart", open the app, select [facility], and log in with your MyChart username and password.  Due to Covid, a mask is required upon entering the hospital/clinic. If you do not have a mask, one will be given to you upon arrival. For doctor visits, patients may have 1 support person aged 85 or older with them. For treatment visits, patients cannot have anyone with them due to current Covid guidelines and our immunocompromised population.   Oxaliplatin Injection What is this medication? OXALIPLATIN (ox AL i PLA tin) is a chemotherapy drug. It targets fast dividing cells, like cancer cells, and causes these cells to die. This medicine is used to treat cancers of the colon and rectum, and many other cancers. This  medicine may be used for other purposes; ask your health care provider or pharmacist if you have questions. COMMON BRAND NAME(S): Eloxatin What should I tell my care team before I take this medication? They need to know if you have any of these conditions: heart disease history of irregular heartbeat liver  disease low blood counts, like white cells, platelets, or red blood cells lung or breathing disease, like asthma take medicines that treat or prevent blood clots tingling of the fingers or toes, or other nerve disorder an unusual or allergic reaction to oxaliplatin, other chemotherapy, other medicines, foods, dyes, or preservatives pregnant or trying to get pregnant breast-feeding How should I use this medication? This drug is given as an infusion into a vein. It is administered in a hospital or clinic by a specially trained health care professional. Talk to your pediatrician regarding the use of this medicine in children. Special care may be needed. Overdosage: If you think you have taken too much of this medicine contact a poison control center or emergency room at once. NOTE: This medicine is only for you. Do not share this medicine with others. What if I miss a dose? It is important not to miss a dose. Call your doctor or health care professional if you are unable to keep an appointment. What may interact with this medication? Do not take this medicine with any of the following medications: cisapride dronedarone pimozide thioridazine This medicine may also interact with the following medications: aspirin and aspirin-like medicines certain medicines that treat or prevent blood clots like warfarin, apixaban, dabigatran, and rivaroxaban cisplatin cyclosporine diuretics medicines for infection like acyclovir, adefovir, amphotericin B, bacitracin, cidofovir, foscarnet, ganciclovir, gentamicin, pentamidine, vancomycin NSAIDs, medicines for pain and inflammation, like ibuprofen or naproxen other medicines that prolong the QT interval (an abnormal heart rhythm) pamidronate zoledronic acid This list may not describe all possible interactions. Give your health care provider a list of all the medicines, herbs, non-prescription drugs, or dietary supplements you use. Also tell them if you  smoke, drink alcohol, or use illegal drugs. Some items may interact with your medicine. What should I watch for while using this medication? Your condition will be monitored carefully while you are receiving this medicine. You may need blood work done while you are taking this medicine. This medicine may make you feel generally unwell. This is not uncommon as chemotherapy can affect healthy cells as well as cancer cells. Report any side effects. Continue your course of treatment even though you feel ill unless your healthcare professional tells you to stop. This medicine can make you more sensitive to cold. Do not drink cold drinks or use ice. Cover exposed skin before coming in contact with cold temperatures or cold objects. When out in cold weather wear warm clothing and cover your mouth and nose to warm the air that goes into your lungs. Tell your doctor if you get sensitive to the cold. Do not become pregnant while taking this medicine or for 9 months after stopping it. Women should inform their health care professional if they wish to become pregnant or think they might be pregnant. Men should not father a child while taking this medicine and for 6 months after stopping it. There is potential for serious side effects to an unborn child. Talk to your health care professional for more information. Do not breast-feed a child while taking this medicine or for 3 months after stopping it. This medicine has caused ovarian failure in some women. This medicine  may make it more difficult to get pregnant. Talk to your health care professional if you are concerned about your fertility. This medicine has caused decreased sperm counts in some men. This may make it more difficult to father a child. Talk to your health care professional if you are concerned about your fertility. This medicine may increase your risk of getting an infection. Call your health care professional for advice if you get a fever, chills, or  sore throat, or other symptoms of a cold or flu. Do not treat yourself. Try to avoid being around people who are sick. Avoid taking medicines that contain aspirin, acetaminophen, ibuprofen, naproxen, or ketoprofen unless instructed by your health care professional. These medicines may hide a fever. Be careful brushing or flossing your teeth or using a toothpick because you may get an infection or bleed more easily. If you have any dental work done, tell your dentist you are receiving this medicine. What side effects may I notice from receiving this medication? Side effects that you should report to your doctor or health care professional as soon as possible: allergic reactions like skin rash, itching or hives, swelling of the face, lips, or tongue breathing problems cough low blood counts - this medicine may decrease the number of white blood cells, red blood cells, and platelets. You may be at increased risk for infections and bleeding nausea, vomiting pain, redness, or irritation at site where injected pain, tingling, numbness in the hands or feet signs and symptoms of bleeding such as bloody or black, tarry stools; red or dark brown urine; spitting up blood or brown material that looks like coffee grounds; red spots on the skin; unusual bruising or bleeding from the eyes, gums, or nose signs and symptoms of a dangerous change in heartbeat or heart rhythm like chest pain; dizziness; fast, irregular heartbeat; palpitations; feeling faint or lightheaded; falls signs and symptoms of infection like fever; chills; cough; sore throat; pain or trouble passing urine signs and symptoms of liver injury like dark yellow or brown urine; general ill feeling or flu-like symptoms; light-colored stools; loss of appetite; nausea; right upper belly pain; unusually weak or tired; yellowing of the eyes or skin signs and symptoms of low red blood cells or anemia such as unusually weak or tired; feeling faint or  lightheaded; falls signs and symptoms of muscle injury like dark urine; trouble passing urine or change in the amount of urine; unusually weak or tired; muscle pain; back pain Side effects that usually do not require medical attention (report to your doctor or health care professional if they continue or are bothersome): changes in taste diarrhea gas hair loss loss of appetite mouth sores This list may not describe all possible side effects. Call your doctor for medical advice about side effects. You may report side effects to FDA at 1-800-FDA-1088. Where should I keep my medication? This drug is given in a hospital or clinic and will not be stored at home. NOTE: This sheet is a summary. It may not cover all possible information. If you have questions about this medicine, talk to your doctor, pharmacist, or health care provider.  2022 [employer] Standard (2021-07-23 00:00:00)  Leucovorin injection What is this medication? LEUCOVORIN (loo koe VOR in) is used to prevent or treat the harmful effects of some medicines. This medicine is used to treat anemia caused by a low amount of folic acid in the body. It is also used with 5-fluorouracil (5-FU) to treat colon cancer. This medicine may  be used for other purposes; ask your health care provider or pharmacist if you have questions. What should I tell my care team before I take this medication? They need to know if you have any of these conditions: anemia from low levels of vitamin B-12 in the blood an unusual or allergic reaction to leucovorin, folic acid, other medicines, foods, dyes, or preservatives pregnant or trying to get pregnant breast-feeding How should I use this medication? This medicine is for injection into a muscle or into a vein. It is given by a health care professional in a hospital or clinic setting. Talk to your pediatrician regarding the use of this medicine in children. Special care may be needed. Overdosage: If you  think you have taken too much of this medicine contact a poison control center or emergency room at once. NOTE: This medicine is only for you. Do not share this medicine with others. What if I miss a dose? This does not apply. What may interact with this medication? capecitabine fluorouracil phenobarbital phenytoin primidone trimethoprim-sulfamethoxazole This list may not describe all possible interactions. Give your health care provider a list of all the medicines, herbs, non-prescription drugs, or dietary supplements you use. Also tell them if you smoke, drink alcohol, or use illegal drugs. Some items may interact with your medicine. What should I watch for while using this medication? Your condition will be monitored carefully while you are receiving this medicine. This medicine may increase the side effects of 5-fluorouracil, 5-FU. Tell your doctor or health care professional if you have diarrhea or mouth sores that do not get better or that get worse. What side effects may I notice from receiving this medication? Side effects that you should report to your doctor or health care professional as soon as possible: allergic reactions like skin rash, itching or hives, swelling of the face, lips, or tongue breathing problems fever, infection mouth sores unusual bleeding or bruising unusually weak or tired Side effects that usually do not require medical attention (report to your doctor or health care professional if they continue or are bothersome): constipation or diarrhea loss of appetite nausea, vomiting This list may not describe all possible side effects. Call your doctor for medical advice about side effects. You may report side effects to FDA at 1-800-FDA-1088. Where should I keep my medication? This drug is given in a hospital or clinic and will not be stored at home. NOTE: This sheet is a summary. It may not cover all possible information. If you have questions about this  medicine, talk to your doctor, pharmacist, or health care provider.  2022 [employer] Standard (2008-05-11 00:00:00)  Fluorouracil, 5-FU injection What is this medication? FLUOROURACIL, 5-FU (flure oh YOOR a sil) is a chemotherapy drug. It slows the growth of cancer cells. This medicine is used to treat many types of cancer like breast cancer, colon or rectal cancer, pancreatic cancer, and stomach cancer. This medicine may be used for other purposes; ask your health care provider or pharmacist if you have questions. COMMON BRAND NAME(S): Adrucil What should I tell my care team before I take this medication? They need to know if you have any of these conditions: blood disorders dihydropyrimidine dehydrogenase (DPD) deficiency infection (especially a virus infection such as chickenpox, cold sores, or herpes) kidney disease liver disease malnourished, poor nutrition recent or ongoing radiation therapy an unusual or allergic reaction to fluorouracil, other chemotherapy, other medicines, foods, dyes, or preservatives pregnant or trying to get pregnant breast-feeding How should I  use this medication? This drug is given as an infusion or injection into a vein. It is administered in a hospital or clinic by a specially trained health care professional. Talk to your pediatrician regarding the use of this medicine in children. Special care may be needed. Overdosage: If you think you have taken too much of this medicine contact a poison control center or emergency room at once. NOTE: This medicine is only for you. Do not share this medicine with others. What if I miss a dose? It is important not to miss your dose. Call your doctor or health care professional if you are unable to keep an appointment. What may interact with this medication? Do not take this medicine with any of the following medications: live virus vaccines This medicine may also interact with the following medications: medicines  that treat or prevent blood clots like warfarin, enoxaparin, and dalteparin This list may not describe all possible interactions. Give your health care provider a list of all the medicines, herbs, non-prescription drugs, or dietary supplements you use. Also tell them if you smoke, drink alcohol, or use illegal drugs. Some items may interact with your medicine. What should I watch for while using this medication? Visit your doctor for checks on your progress. This drug may make you feel generally unwell. This is not uncommon, as chemotherapy can affect healthy cells as well as cancer cells. Report any side effects. Continue your course of treatment even though you feel ill unless your doctor tells you to stop. In some cases, you may be given additional medicines to help with side effects. Follow all directions for their use. Call your doctor or health care professional for advice if you get a fever, chills or sore throat, or other symptoms of a cold or flu. Do not treat yourself. This drug decreases your body's ability to fight infections. Try to avoid being around people who are sick. This medicine may increase your risk to bruise or bleed. Call your doctor or health care professional if you notice any unusual bleeding. Be careful brushing and flossing your teeth or using a toothpick because you may get an infection or bleed more easily. If you have any dental work done, tell your dentist you are receiving this medicine. Avoid taking products that contain aspirin, acetaminophen, ibuprofen, naproxen, or ketoprofen unless instructed by your doctor. These medicines may hide a fever. Do not become pregnant while taking this medicine. Women should inform their doctor if they wish to become pregnant or think they might be pregnant. There is a potential for serious side effects to an unborn child. Talk to your health care professional or pharmacist for more information. Do not breast-feed an infant while taking  this medicine. Men should inform their doctor if they wish to father a child. This medicine may lower sperm counts. Do not treat diarrhea with over the counter products. Contact your doctor if you have diarrhea that lasts more than 2 days or if it is severe and watery. This medicine can make you more sensitive to the sun. Keep out of the sun. If you cannot avoid being in the sun, wear protective clothing and use sunscreen. Do not use sun lamps or tanning beds/booths. What side effects may I notice from receiving this medication? Side effects that you should report to your doctor or health care professional as soon as possible: allergic reactions like skin rash, itching or hives, swelling of the face, lips, or tongue low blood counts - this medicine  may decrease the number of white blood cells, red blood cells and platelets. You may be at increased risk for infections and bleeding. signs of infection - fever or chills, cough, sore throat, pain or difficulty passing urine signs of decreased platelets or bleeding - bruising, pinpoint red spots on the skin, black, tarry stools, blood in the urine signs of decreased red blood cells - unusually weak or tired, fainting spells, lightheadedness breathing problems changes in vision chest pain mouth sores nausea and vomiting pain, swelling, redness at site where injected pain, tingling, numbness in the hands or feet redness, swelling, or sores on hands or feet stomach pain unusual bleeding Side effects that usually do not require medical attention (report to your doctor or health care professional if they continue or are bothersome): changes in finger or toe nails diarrhea dry or itchy skin hair loss headache loss of appetite sensitivity of eyes to the light stomach upset unusually teary eyes This list may not describe all possible side effects. Call your doctor for medical advice about side effects. You may report side effects to FDA at  1-800-FDA-1088. Where should I keep my medication? This drug is given in a hospital or clinic and will not be stored at home. NOTE: This sheet is a summary. It may not cover all possible information. If you have questions about this medicine, talk to your doctor, pharmacist, or health care provider.  2022 [employer] Standard (2021-07-23 00:00:00)

## 2021-10-24 ENCOUNTER — Other Ambulatory Visit: Payer: Self-pay

## 2021-10-24 ENCOUNTER — Inpatient Hospital Stay: Payer: Medicare HMO

## 2021-10-24 VITALS — BP 132/60 | HR 78 | Temp 99.1°F | Resp 20

## 2021-10-24 DIAGNOSIS — Z5111 Encounter for antineoplastic chemotherapy: Secondary | ICD-10-CM | POA: Diagnosis not present

## 2021-10-24 DIAGNOSIS — D6481 Anemia due to antineoplastic chemotherapy: Secondary | ICD-10-CM | POA: Diagnosis not present

## 2021-10-24 DIAGNOSIS — C20 Malignant neoplasm of rectum: Secondary | ICD-10-CM | POA: Diagnosis not present

## 2021-10-24 DIAGNOSIS — Z23 Encounter for immunization: Secondary | ICD-10-CM | POA: Diagnosis not present

## 2021-10-24 MED ORDER — HEPARIN SOD (PORK) LOCK FLUSH 100 UNIT/ML IV SOLN
500.0000 [IU] | Freq: Once | INTRAVENOUS | Status: AC | PRN
  Administered 2021-10-24: 500 [IU]

## 2021-10-24 MED ORDER — SODIUM CHLORIDE 0.9% FLUSH
10.0000 mL | INTRAVENOUS | Status: DC | PRN
  Administered 2021-10-24: 10 mL

## 2021-10-24 MED ORDER — PEGFILGRASTIM-CBQV 6 MG/0.6ML ~~LOC~~ SOSY
6.0000 mg | PREFILLED_SYRINGE | Freq: Once | SUBCUTANEOUS | Status: AC
  Administered 2021-10-24: 6 mg via SUBCUTANEOUS

## 2021-10-24 NOTE — Patient Instructions (Signed)
[facility]  Discharge Instructions: Thank you for choosing [facility] to provide your oncology and hematology care.   If you have a lab appointment with the [facility], please go directly to the [facility] and check in at the registration area.   Wear comfortable clothing and clothing appropriate for easy access to any Portacath or PICC line.   We strive to give you quality time with your provider. You may need to reschedule your appointment if you arrive late (15 or more minutes).  Arriving late affects you and other patients whose appointments are after yours.  Also, if you miss three or more appointments without notifying the office, you may be dismissed from the clinic at the provider's discretion.      For prescription refill requests, have your pharmacy contact our office and allow 72 hours for refills to be completed.    Today you received the following [place]    To help prevent nausea and vomiting after your treatment, we encourage you to take your nausea medication as directed.  BELOW ARE SYMPTOMS THAT SHOULD BE REPORTED IMMEDIATELY: *FEVER GREATER THAN 100.4 F (38 C) OR HIGHER *CHILLS OR SWEATING *NAUSEA AND VOMITING THAT IS NOT CONTROLLED WITH YOUR NAUSEA MEDICATION *UNUSUAL SHORTNESS OF BREATH *UNUSUAL BRUISING OR BLEEDING *URINARY PROBLEMS (pain or burning when urinating, or frequent urination) *BOWEL PROBLEMS (unusual diarrhea, constipation, pain near the anus) TENDERNESS IN MOUTH AND THROAT WITH OR WITHOUT PRESENCE OF ULCERS (sore throat, sores in mouth, or a toothache) UNUSUAL RASH, SWELLING OR PAIN  UNUSUAL VAGINAL DISCHARGE OR ITCHING   Items with * indicate a potential emergency and should be followed up as soon as possible or go to the Emergency Department if any problems should occur.  Please show the CHEMOTHERAPY ALERT CARD or IMMUNOTHERAPY ALERT CARD at check-in to the Emergency Department and triage  nurse.  Should you have questions after your visit or need to cancel or reschedule your appointment, please contact [facility]  Dept: [phone]  and follow the prompts.  Office hours are 8:00 a.m. to 4:30 p.m. Monday - Friday. Please note that voicemails left after 4:00 p.m. may not be returned until the following business day.  We are closed weekends and major holidays. You have access to a nurse at all times for urgent questions. Please call the main number to the clinic Dept: [phone] and follow the prompts.   For any non-urgent questions, you may also contact your provider using MyChart. We now offer e-Visits for anyone 50 and older to request care online for non-urgent symptoms. For details visit [email or web address].   Also download the MyChart app! Go to the app store, search "MyChart", open the app, select [facility], and log in with your MyChart username and password.  Due to Covid, a mask is required upon entering the hospital/clinic. If you do not have a mask, one will be given to you upon arrival. For doctor visits, patients may have 1 support person aged 41 or older with them. For treatment visits, patients cannot have anyone with them due to current Covid guidelines and our immunocompromised population.   Pegfilgrastim Injection What is this medication? PEGFILGRASTIM (PEG fil gra stim) lowers the risk of infection in people who are receiving chemotherapy. It works by Building control surveyor make more white blood cells, which protects your body from infection. It may also be used to help people who have been exposed to high doses of  radiation. This medicine may be used for other purposes; ask your health care provider or pharmacist if you have questions. COMMON BRAND NAME(S): Rexene Edison, Ziextenzo What should I tell my care team before I take this medication? They need to know if you have any of these conditions: Kidney  disease Latex allergy Ongoing radiation therapy Sickle cell disease Skin reactions to acrylic adhesives (On-Body Injector only) An unusual or allergic reaction to pegfilgrastim, filgrastim, other medications, foods, dyes, or preservatives Pregnant or trying to get pregnant Breast-feeding How should I use this medication? This medication is for injection under the skin. If you get this medication at home, you will be taught how to prepare and give the pre-filled syringe or how to use the On-body Injector. Refer to the patient Instructions for Use for detailed instructions. Use exactly as directed. Tell your care team immediately if you suspect that the On-body Injector may not have performed as intended or if you suspect the use of the On-body Injector resulted in a missed or partial dose. It is important that you put your used needles and syringes in a special sharps container. Do not put them in a trash can. If you do not have a sharps container, call your pharmacist or care team to get one. Talk to your care team about the use of this medication in children. While this medication may be prescribed for selected conditions, precautions do apply. Overdosage: If you think you have taken too much of this medicine contact a poison control center or emergency room at once. NOTE: This medicine is only for you. Do not share this medicine with others. What if I miss a dose? It is important not to miss your dose. Call your care team if you miss your dose. If you miss a dose due to an On-body Injector failure or leakage, a new dose should be administered as soon as possible using a single prefilled syringe for manual use. What may interact with this medication? Interactions have not been studied. This list may not describe all possible interactions. Give your health care provider a list of all the medicines, herbs, non-prescription drugs, or dietary supplements you use. Also tell them if you smoke, drink  alcohol, or use illegal drugs. Some items may interact with your medicine. What should I watch for while using this medication? Your condition will be monitored carefully while you are receiving this medication. You may need blood work done while you are taking this medication. Talk to your care team about your risk of cancer. You may be more at risk for certain types of cancer if you take this medication. If you are going to need a MRI, CT scan, or other procedure, tell your care team that you are using this medication (On-Body Injector only). What side effects may I notice from receiving this medication? Side effects that you should report to your care team as soon as possible: Allergic reactions--skin rash, itching, hives, swelling of the face, lips, tongue, or throat Capillary leak syndrome--stomach or muscle pain, unusual weakness or fatigue, feeling faint or lightheaded, decrease in the amount of urine, swelling of the ankles, hands, or feet, trouble breathing High white blood cell level--fever, fatigue, trouble breathing, night sweats, change in vision, weight loss Inflammation of the aorta--fever, fatigue, back, chest, or stomach pain, severe headache Kidney injury (glomerulonephritis)--decrease in the amount of urine, red or dark brown urine, foamy or bubbly urine, swelling of the ankles, hands, or feet Shortness of breath or  trouble breathing Spleen injury--pain in upper left stomach or shoulder Unusual bruising or bleeding Side effects that usually do not require medical attention (report to your care team if they continue or are bothersome): Bone pain Pain in the hands or feet This list may not describe all possible side effects. Call your doctor for medical advice about side effects. You may report side effects to FDA at 1-800-FDA-1088. Where should I keep my medication? Keep out of the reach of children. If you are using this medication at home, you will be instructed on how to  store it. Throw away any unused medication after the expiration date on the label. NOTE: This sheet is a summary. It may not cover all possible information. If you have questions about this medicine, talk to your doctor, pharmacist, or health care provider.  2022 Elsevier/Gold Standard (2021-07-23 00:00:00)

## 2021-10-30 ENCOUNTER — Encounter: Payer: Self-pay | Admitting: Podiatry

## 2021-10-30 ENCOUNTER — Ambulatory Visit (INDEPENDENT_AMBULATORY_CARE_PROVIDER_SITE_OTHER): Payer: Medicare HMO | Admitting: Podiatry

## 2021-10-30 ENCOUNTER — Other Ambulatory Visit: Payer: Self-pay

## 2021-10-30 DIAGNOSIS — L97412 Non-pressure chronic ulcer of right heel and midfoot with fat layer exposed: Secondary | ICD-10-CM | POA: Diagnosis not present

## 2021-10-30 DIAGNOSIS — E08621 Diabetes mellitus due to underlying condition with foot ulcer: Secondary | ICD-10-CM | POA: Diagnosis not present

## 2021-10-30 DIAGNOSIS — L84 Corns and callosities: Secondary | ICD-10-CM

## 2021-10-30 NOTE — Progress Notes (Signed)
Subjective:  Patient ID: [name], female    DOB: 1946-07-10,   MRN: [id number]  Chief Complaint  Patient presents with   Wound Check    F/U BL hallux wound check -pt states," they are doing much better." - light redness and swelling at Rt - no drainage - closing lesion at Rt - 3/10 soreness tx: betadine and bandaid daily      75 y.o. female presents for follow-up of a right foot wound.  She was seen by Dr. Elisha Ponder on 11/22 for nail trimming and a wound was noticed at that time. She has been in a surgical shoe and dressing the wound with iodosorb. Does relates there are times when she is not in the surgical shoe. Presents today in regular shoes.  Cultures were taken and were negative as were X-rays. Denies any other pedal complaints. Denies n/v/f/c.   Past Medical History:  Diagnosis Date   Arthritis    Basal ganglia stroke ([place])    Bilateral lower extremity edema    Cancer ([place])    Carotid artery occlusion    Flank pain    Full dentures    History of cervical cancer    11/ 1994  Stage IB  s/p  high dose radiation brachytherapy @ [facility] 01/ 1995--- per pt no recurrence   History of chronic bronchitis    History of hyperthyroidism    d 03/ 2011---due to grave's disease--- s/p  RAI treatment 04/ 2011   History of kidney stones    History of sepsis 05/03/2018   due to UTI with klebsiella/ pyelonephritis/ ureteral obstruction cause by stone   Hyperlipidemia    Hypothyroidism, postradioiodine therapy    endocrinologist--  dr Loanne Drilling--  dx graves disease and s/p RAI i131 treatement 4/ 2011   Nauseated    Oral thrush 05/03/2018   Pneumonia    Type 2 diabetes mellitus treated with insulin ([place])    FOLLOWED BY PCP   Urgency of urination    Wears glasses     Objective:  Physical Exam: Vascular: DP/PT pulses 2/4 bilateral. CFT <3 seconds. Normal hair growth on digits. No edema.  Skin. No lacerations or abrasions bilateral feet. Right hallux wound with granular base and  surrounding hyperkeratosis measuring about 0.5 cm x 0.3 cm x 0.2 cm. No erythema edema or purulence noted.  Hyperkeratotic lesion noted to sub first metatarsal right and plantar left hallux. Pre-ulcerative.  Musculoskeletal: MMT 5/5 bilateral lower extremities in DF, PF, Inversion and Eversion. Deceased ROM in DF of ankle joint.  Neurological: Sensation intact to light touch.   Assessment:   1. Diabetic ulcer of right midfoot associated with diabetes mellitus due to underlying condition, with fat layer exposed ([place])   2. Pre-ulcerative calluses      Plan:  Patient was evaluated and treated and all questions answered. Ulcer right plantar first metatarsal with fat layer exposed.  -Debridement as below. -Pre-ulcerative calluses debrided as well. Advised to keep padding around these areas.  -Dressed with iodosorb, DSD. -Off-loading with surgical shoe. -No abx indicated.  -Discussed glucose control and proper protein-rich diet.  -Discussed if any worsening redness, pain, fever or chills to call or may need to report to the emergency room. Patient expressed understanding.   Procedure: Excisional Debridement of Wound Rationale: Removal of non-viable soft tissue from the wound to promote healing.  Anesthesia: none Pre-Debridement Wound Measurements: Overlying hyperkeratosis  Post-Debridement Wound Measurements: 0.5 cm x 0.3 cm x 0.2 cm  Type of Debridement:  Sharp Excisional Tissue Removed: Non-viable soft tissue Depth of Debridement: subcutaneous tissue. Technique: Sharp excisional debridement to bleeding, viable wound base.  Dressing: Dry, sterile, compression dressing. Disposition: Patient tolerated procedure well. Patient to return in 2 week for follow-up.  No follow-ups on file.   [name], DPM

## 2021-10-31 ENCOUNTER — Other Ambulatory Visit (INDEPENDENT_AMBULATORY_CARE_PROVIDER_SITE_OTHER): Payer: Medicare HMO

## 2021-10-31 ENCOUNTER — Ambulatory Visit (INDEPENDENT_AMBULATORY_CARE_PROVIDER_SITE_OTHER): Payer: Medicare HMO | Admitting: Physician Assistant

## 2021-10-31 ENCOUNTER — Encounter: Payer: Self-pay | Admitting: Physician Assistant

## 2021-10-31 VITALS — BP 140/68 | HR 80 | Ht 60.0 in | Wt 124.2 lb

## 2021-10-31 DIAGNOSIS — R112 Nausea with vomiting, unspecified: Secondary | ICD-10-CM

## 2021-10-31 DIAGNOSIS — K250 Acute gastric ulcer with hemorrhage: Secondary | ICD-10-CM

## 2021-10-31 DIAGNOSIS — R1084 Generalized abdominal pain: Secondary | ICD-10-CM

## 2021-10-31 LAB — COMPREHENSIVE METABOLIC PANEL
ALT: 6 U/L (ref 0–35)
AST: 12 U/L (ref 0–37)
Albumin: 3.2 g/dL — ABNORMAL LOW (ref 3.5–5.2)
Alkaline Phosphatase: 191 U/L — ABNORMAL HIGH (ref 39–117)
BUN: 10 mg/dL (ref 6–23)
CO2: 20 mEq/L (ref 19–32)
Calcium: 8.2 mg/dL — ABNORMAL LOW (ref 8.4–10.5)
Chloride: 108 mEq/L (ref 96–112)
Creatinine, Ser: 0.85 mg/dL (ref 0.40–1.20)
GFR: 66.88 mL/min (ref >60.00)
Glucose, Bld: 199 mg/dL — ABNORMAL HIGH (ref 70–99)
Potassium: 4 mEq/L (ref 3.5–5.1)
Sodium: 140 mEq/L (ref 135–145)
Total Bilirubin: 0.4 mg/dL (ref 0.2–1.2)
Total Protein: 5.9 g/dL — ABNORMAL LOW (ref 6.0–8.3)

## 2021-10-31 LAB — CBC WITH DIFFERENTIAL/PLATELET
Basophils Absolute: 0 10*3/uL (ref 0.0–0.1)
Basophils Relative: 0.4 % (ref 0.0–3.0)
Eosinophils Absolute: 0 10*3/uL (ref 0.0–0.7)
Eosinophils Relative: 0.4 % (ref 0.0–5.0)
HCT: 27 % — ABNORMAL LOW (ref 36.0–46.0)
Hemoglobin: 8.6 g/dL — ABNORMAL LOW (ref 12.0–15.0)
Lymphocytes Relative: 10.5 % — ABNORMAL LOW (ref 12.0–46.0)
Lymphs Abs: 0.5 10*3/uL — ABNORMAL LOW (ref 0.7–4.0)
MCHC: 31.7 g/dL (ref 30.0–36.0)
MCV: 100.6 fl — ABNORMAL HIGH (ref 78.0–100.0)
Monocytes Absolute: 0.5 10*3/uL (ref 0.1–1.0)
Monocytes Relative: 9.7 % (ref 3.0–12.0)
Neutro Abs: 3.9 10*3/uL (ref 1.4–7.7)
Neutrophils Relative %: 79 % — ABNORMAL HIGH (ref 43.0–77.0)
Platelets: 119 10*3/uL — ABNORMAL LOW (ref 150.0–400.0)
RBC: 2.69 Mil/uL — ABNORMAL LOW (ref 3.87–5.11)
RDW: 19.9 % — ABNORMAL HIGH (ref 11.5–15.5)
WBC: 4.9 10*3/uL (ref 4.0–10.5)

## 2021-10-31 MED ORDER — XTAMPZA ER 9 MG PO C12A: 1.0000 | EXTENDED_RELEASE_CAPSULE | Freq: Two times a day (BID) | ORAL | 0 refills | Status: DC

## 2021-10-31 MED ORDER — PANTOPRAZOLE SODIUM 40 MG PO TBEC: 40.0000 mg | DELAYED_RELEASE_TABLET | Freq: Two times a day (BID) | ORAL | 4 refills | Status: DC

## 2021-10-31 MED ORDER — ONDANSETRON HCL 8 MG PO TABS
8.0000 mg | ORAL_TABLET | Freq: Four times a day (QID) | ORAL | 1 refills | Status: DC | PRN
Start: 2021-10-31 — End: 2023-10-12

## 2021-10-31 NOTE — Patient Instructions (Signed)
If you are age 75 or older, your body mass index should be between 23-30. Your Body mass index is 24.26 kg/m. If this is out of the aforementioned range listed, please consider follow up with your Primary Care Provider. ________________________________________________________  The [facility] providers would like to encourage you to use [facility] to communicate with providers for non-urgent requests or questions.  Due to long hold times on the telephone, sending your provider a message by [facility] may be a faster and more efficient way to get a response.  Please allow 48 business hours for a response.  Please remember that this is for non-urgent requests.  _______________________________________________________  [name] have been scheduled for an endoscopy. Please follow written instructions given to you at your visit today. If you use inhalers (even only as needed), please bring them with you on the day of your procedure. **For now we plan to have you continue your Plavix for this procedure, if anything changes we will contact you.  Continue Zofran and Pantoprazole.  Follow up pending at this time.  Thank you for entrusting me with your care and choosing [facility].  Amy Esterwood, PA-C

## 2021-10-31 NOTE — Progress Notes (Addendum)
Subjective:    Patient ID: [name], female    DOB: January 05, 1946, 75 y.o.   MRN: [id number]  HPI [name] is a pleasant 75 year old white female, established with Dr. Tarri Glenn who comes in today for hospital follow-up.  She was hospitalized 10/08/2021 through 10/12/2021 with possible sepsis and dehydration.  She had intractable nausea and vomiting during that admission.  She did have a Pseudomonas UTI and completed 5 days of IV antibiotics.  She underwent EGD on 10/12/2021 that showed nonbleeding gastric and duodenal ulcers, largest was a 10 mm cratered duodenal ulcer, also noted to have grade a esophagitis.  Biopsies of the duodenum showed peptic duodenitis, gastric biopsies chronic active gastritis no intestinal metaplasia, on esophageal biopsies stain positive for fungal elements. She was discharged on twice daily Protonix, and Zofran. Patient has multiple comorbidities, history of coronary artery disease, carotid disease, prior CVA, adult onset diabetes mellitus, hypertension and is currently undergoing chemotherapy and radiation, followed by Dr. Benay Spice for locally advanced rectal adenocarcinoma status post diverting loop colostomy May 2022 done at [facility].  Also with cervical cancer.  She is felt to have 2 synchronous malignancies. She underwent a total abdominal hysterectomy, cystoscopy, insertion of ureteral stents, exploratory lap and aborted low anterior resection May 2022. She is on FOLFOX .,  And has had radiation. Today she is complaining of rather generalized abdominal pain, seems to be more prominent in the lower abdomen in the left and around her colostomy.  It sounds as if she had previously been on oxycodone but had run out of pain medication.  She has been having nausea and vomiting on a fairly regular basis.  She does have Zofran at home but has not been taking this scheduled.  No fever or chills.  Eating okay and says she does not necessarily vomit after eating but more just  randomly intermittently.  No hematemesis.  She has not noticed any melena or heme in the colostomy.  She says after she has chemotherapy she usually has some rectal bleeding from the rectum.  She states that she always feels worse after chemotherapy with increase in nausea and vomiting for some days. Last abdominal imaging was done on 10/01/2021 through [facility] which showed unchanged findings of rectal malignancy status posttreatment, marked thickening of the proximal duodenal wall, unchanged bilateral inguinal lymph nodes, unchanged diffuse bladder wall thickening and perivesicular straining staining which may represent radiation cystitis, and new mild right lower lobe bronchial wall thickening, mucus impaction and tree-in-bud pulmonary nodules. Labs on 10/22/2021 hemoglobin 8.5 hematocrit 28.3 MCV 103. Review of Systems Pertinent positive and negative review of systems were noted in the above HPI section.  All other review of systems was otherwise negative.   Outpatient Encounter Medications as of 10/31/2021  Medication Sig   aspirin 81 MG EC tablet Take 1 tablet (81 mg total) by mouth daily. RESUME AFTER 1 WEEK (Patient taking differently: Take 81 mg by mouth every morning.)   atorvastatin (LIPITOR) 40 MG tablet Take 40 mg by mouth every morning.   baclofen (LIORESAL) 10 MG tablet Take 10 mg by mouth 4 (four) times daily as needed for muscle spasms.   cephALEXin (KEFLEX) 250 MG capsule Take 1 capsule (250 mg total) by mouth daily. RESUME AFTER YOU HAVE COMPLETED THE COURSE OF CIPRO (Patient taking differently: Take 250 mg by mouth every morning.)   clopidogrel (PLAVIX) 75 MG tablet Take 1 tablet (75 mg total) by mouth at bedtime.   docusate sodium (COLACE) 100 MG  capsule Take 100 mg by mouth 2 (two) times daily as needed for mild constipation.   ferrous sulfate 325 (65 FE) MG tablet Take 1 tablet (325 mg total) by mouth 2 (two) times daily with a meal.   fluconazole (DIFLUCAN) 100 MG tablet  Take 1 tablet (100 mg total) by mouth daily. Take 2 tabs on first day, then 1 tab for 9 more days.   gabapentin (NEURONTIN) 300 MG capsule Take 300-600 mg by mouth See admin instructions. Take one capsule (300 mg) by mouth every morning and two capsules (600 mg) at night   gemfibrozil (LOPID) 600 MG tablet Take 600 mg by mouth 2 (two) times daily.   insulin degludec (TRESIBA FLEXTOUCH) 100 UNIT/ML FlexTouch Pen Inject 10 Units into the skin daily. (Patient taking differently: Inject 20 Units into the skin daily as needed (CBG >200).)   Lancets (ONETOUCH DELICA PLUS [id number]) MISC Apply topically 2 (two) times daily.   levothyroxine (SYNTHROID) 125 MCG tablet Take 1 tablet (125 mcg total) by mouth daily at 6 (six) AM.   metFORMIN (GLUCOPHAGE) 500 MG tablet Take 500 mg by mouth 2 (two) times daily.   nystatin (MYCOSTATIN/NYSTOP) powder Apply 1 application topically 2 (two) times daily. To groin rash   nystatin cream (MYCOSTATIN) Apply 1 application topically 2 (two) times daily as needed (groin rash). Mix with triamcinolone cream   ONETOUCH VERIO test strip 1 each 2 (two) times daily.   polyethylene glycol powder (GLYCOLAX/MIRALAX) 17 GM/SCOOP powder Take 17 g by mouth 2 (two) times daily as needed (constipation).   potassium chloride SA (KLOR-CON) 20 MEQ tablet Take 1 tablet (20 mEq total) by mouth 2 (two) times daily.   prochlorperazine (COMPAZINE) 10 MG tablet Take 10 mg by mouth every 6 (six) hours as needed for nausea or vomiting.   triamcinolone cream (KENALOG) 0.1 % Apply 1 application topically 2 (two) times daily as needed (groin rash). Mix with nystatin cream   [DISCONTINUED] ondansetron (ZOFRAN) 8 MG tablet Take 8 mg by mouth 3 (three) times daily as needed for nausea or vomiting.   [DISCONTINUED] pantoprazole (PROTONIX) 40 MG tablet Take 1 tablet (40 mg total) by mouth 2 (two) times daily.   [DISCONTINUED] XTAMPZA ER 9 MG C12A Take 1 capsule by mouth 2 (two) times daily.   ondansetron  (ZOFRAN) 8 MG tablet Take 1 tablet (8 mg total) by mouth every 6 (six) hours as needed for nausea or vomiting.   pantoprazole (PROTONIX) 40 MG tablet Take 1 tablet (40 mg total) by mouth 2 (two) times daily.   XTAMPZA ER 9 MG C12A Take 1 capsule by mouth 2 (two) times daily.   No facility-administered encounter medications on file as of 10/31/2021.   Allergies  Allergen Reactions   Codeine Nausea Only   Patient Active Problem List   Diagnosis Date Noted   Duodenal ulcer    Acute gastric ulcer with hemorrhage    Abnormal CT of the abdomen    Malnutrition of moderate degree 10/09/2021   Sepsis ([place]) 10/08/2021   Symptomatic anemia 09/23/2021   Pressure injury of skin 08/26/2021   AKI (acute kidney injury) ([place]) 08/25/2021   Sepsis secondary to UTI ([place]) 08/25/2021   Goals of care, counseling/discussion 07/01/2021   Coronary artery disease 03/13/2021   Cellulitis of right lower limb 02/22/2021   Malignant neoplasm of overlapping sites of cervix ([place]) 02/18/2021   Rectal cancer ([place]) 01/29/2021   Rectal mass    Hydronephrosis    Iron deficiency anemia  due to chronic blood loss    Non-intractable vomiting    Lower abdominal pain    UTI (urinary tract infection) 01/18/2021   Acute toxic encephalopathy 09/30/2020   Peripheral nerve disease 09/07/2020   Vitamin D deficiency 09/07/2020   Carotid artery stenosis, symptomatic, right 03/28/2020   Preoperative clearance 03/21/2020   History of COVID-19 02/22/2020   Carotid artery disease ([place]) 02/22/2020   History of CVA (cerebrovascular accident) 02/11/2020   Normocytic anemia 11/29/2019   Hypokalemia 07/27/2019   Hypomagnesemia 07/27/2019   History of cervical cancer 03/18/2019   Essential hypertension 05/04/2018   Bilateral lower extremity edema 07/31/2017   Hypothyroidism 06/06/2010   Type 2 diabetes mellitus ([place]) 10/20/2008   Hyperlipidemia associated with type 2 diabetes mellitus ([place]) 10/20/2008   Social History    Socioeconomic History   Marital status: Married    Spouse name: Not on file   Number of children: 2   Years of education: Not on file   Highest education level: Not on file  Occupational History   Occupation: Disabled  Tobacco Use   Smoking status: Former    Years: 5.00    Types: Cigarettes    Quit date: 05/13/1982    Years since quitting: 39.4   Smokeless tobacco: Never  Vaping Use   Vaping Use: Never used  Substance and Sexual Activity   Alcohol use: No   Drug use: No   Sexual activity: Not Currently  Other Topics Concern   Not on file  Social History Narrative   ** Merged History Encounter **       Married and lives with husband Has 2 children  Housewife Disabled 2-3 cups of caffeine  R handed    Social Determinants of Health   Financial Resource Strain: Not on file  Food Insecurity: Not on file  Transportation Needs: Not on file  Physical Activity: Not on file  Stress: Not on file  Social Connections: Not on file  Intimate Partner Violence: Not on file    Ms. Butson's family history includes Cancer in her mother; Diabetes in her father and mother; Emphysema in her brother, father, and sister.      Objective:    Vitals:   10/31/21 1341  BP: 140/68  Pulse: 80    Physical Exam.Well-developed ill-appearing elderly white female in no acute distress.  Patient comes in in wheelchair, accompanied by family member  Weight, 124 BMI 24.2  HEENT; nontraumatic normocephalic, EOMI, PE R LA, sclera anicteric. Oropharynx; not examined today Neck; supple, no JVD Cardiovascular; regular rate and rhythm with S1-S2, no murmur rub or gallop Pulmonary; Clear bilaterally Abdomen; soft, nondistended, she is tender in the mid abdomen and lower abdomen more in the left lower below the colostomy and above the colostomy, liquid green stool in ostomy, no heme no palpable mass or hepatosplenomegaly, bowel sounds are active Rectal; not done today Skin; benign exam, no jaundice  rash or appreciable lesions Extremities; no clubbing cyanosis or edema skin warm and dry Neuro/Psych; alert and oriented x4, grossly nonfocal mood and affect appropriate        Assessment & Plan:   #26 75 year old white female seen today in post hospital follow-up after recent hospitalization about 2-1/2 weeks ago with nausea and vomiting and sepsis.  She was found to have a Pseudomonas urinary tract infection. Also underwent EGD because of the nausea and vomiting and abnormal CT findings as outlined above of duodenal wall thickening.  She was found to have nonbleeding gastric and  duodenal ulcers, largest was a 10 mm cratered duodenal ulcer and grade a esophagitis.  Biopsies negative for malignancy.  She has been on twice daily PPI and Zofran as needed.  She comes in today saying she has continued to feel poorly over the past couple of weeks, is complaining of rather generalized abdominal pain, more so in the lower abdomen and left lower quadrant.  Continues to have daily episodes of nausea and vomiting not necessarily postprandially.  I do not think that her current symptoms are necessarily secondary to the ulcers, am suspicious her pain is secondary to her pelvic malignancies and treatment.  Rule out progression.  #2 rectal adenocarcinoma locally advanced, inoperative, status post diverting colostomy May 2022 #3 cervical cancer, advanced status post surgical resection May 2022.  Has undergone radiation and is continuing on chemotherapy for both of the above malignancies.  #4 adult onset diabetes mellitus 5.  History of CVA-on Plavix 6.  Coronary artery disease 7.  Hypertension  Plan; continue Protonix 40 mg p.o. twice daily Advised patient to start taking Zofran on a scheduled basis, 1 in the morning before breakfast then every 6 to 8 hours throughout the day, hopefully this will help avert some of her vomiting episodes. I refilled her oxycodone today as she is completely out.  She is  asked to get further refills through oncology CBC with differential and c-Met today She will be scheduled for follow-up EGD with Dr. Tarri Glenn late February 2023 at a 90-monthinterval to confirm healing.  As EGD will be diagnostic we will plan to leave her on Plavix, and baby aspirin.  I think she may need repeat abdominal imaging due to increase in abdominal pain over the past few weeks.  I will discuss with her oncologist Dr.Sherrill.  Bridgette Wolden S Antionne Enrique PA-C 10/31/2021   Cc: MEverardo Beals NP

## 2021-11-01 NOTE — Progress Notes (Signed)
Reviewed and agree with management plans. ? ?Coby Shrewsberry L. Lucas Winograd, MD, MPH  ?

## 2021-11-03 ENCOUNTER — Other Ambulatory Visit: Payer: Self-pay | Admitting: Oncology

## 2021-11-05 ENCOUNTER — Inpatient Hospital Stay: Payer: Medicare HMO

## 2021-11-05 ENCOUNTER — Inpatient Hospital Stay: Payer: Medicare HMO | Admitting: Oncology

## 2021-11-05 ENCOUNTER — Other Ambulatory Visit (HOSPITAL_BASED_OUTPATIENT_CLINIC_OR_DEPARTMENT_OTHER): Payer: Self-pay | Admitting: Oncology

## 2021-11-05 ENCOUNTER — Encounter (INDEPENDENT_AMBULATORY_CARE_PROVIDER_SITE_OTHER): Payer: Medicare HMO

## 2021-11-05 ENCOUNTER — Other Ambulatory Visit: Payer: Self-pay

## 2021-11-05 VITALS — BP 120/59 | HR 87 | Temp 97.7°F | Resp 20 | Ht 60.0 in | Wt 126.0 lb

## 2021-11-05 VITALS — BP 122/46 | HR 71 | Resp 18

## 2021-11-05 DIAGNOSIS — M79661 Pain in right lower leg: Secondary | ICD-10-CM

## 2021-11-05 DIAGNOSIS — C20 Malignant neoplasm of rectum: Secondary | ICD-10-CM

## 2021-11-05 DIAGNOSIS — M7989 Other specified soft tissue disorders: Secondary | ICD-10-CM | POA: Diagnosis not present

## 2021-11-05 DIAGNOSIS — D6481 Anemia due to antineoplastic chemotherapy: Secondary | ICD-10-CM | POA: Diagnosis not present

## 2021-11-05 DIAGNOSIS — Z23 Encounter for immunization: Secondary | ICD-10-CM | POA: Diagnosis not present

## 2021-11-05 DIAGNOSIS — Z5111 Encounter for antineoplastic chemotherapy: Secondary | ICD-10-CM | POA: Diagnosis not present

## 2021-11-05 LAB — CBC WITH DIFFERENTIAL (CANCER CENTER ONLY)
Abs Immature Granulocytes: 0.09 10*3/uL — ABNORMAL HIGH (ref 0.00–0.07)
Basophils Absolute: 0 10*3/uL (ref 0.0–0.1)
Basophils Relative: 0 %
Eosinophils Absolute: 0.1 10*3/uL (ref 0.0–0.5)
Eosinophils Relative: 1 %
HCT: 27.5 % — ABNORMAL LOW (ref 36.0–46.0)
Hemoglobin: 8.4 g/dL — ABNORMAL LOW (ref 12.0–15.0)
Immature Granulocytes: 1 %
Lymphocytes Relative: 13 %
Lymphs Abs: 1.3 10*3/uL (ref 0.7–4.0)
MCH: 32.3 pg (ref 26.0–34.0)
MCHC: 30.5 g/dL (ref 30.0–36.0)
MCV: 105.8 fL — ABNORMAL HIGH (ref 80.0–100.0)
Monocytes Absolute: 0.8 10*3/uL (ref 0.1–1.0)
Monocytes Relative: 8 %
Neutro Abs: 7.6 10*3/uL (ref 1.7–7.7)
Neutrophils Relative %: 77 %
Platelet Count: 116 10*3/uL — ABNORMAL LOW (ref 150–400)
RBC: 2.6 MIL/uL — ABNORMAL LOW (ref 3.87–5.11)
RDW: 21.7 % — ABNORMAL HIGH (ref 11.5–15.5)
WBC Count: 9.9 10*3/uL (ref 4.0–10.5)
nRBC: 0 % (ref 0.0–0.2)

## 2021-11-05 LAB — CMP (CANCER CENTER ONLY)
ALT: 6 U/L (ref 0–44)
AST: 11 U/L — ABNORMAL LOW (ref 15–41)
Albumin: 3.2 g/dL — ABNORMAL LOW (ref 3.5–5.0)
Alkaline Phosphatase: 194 U/L — ABNORMAL HIGH (ref 38–126)
Anion gap: 17 — ABNORMAL HIGH (ref 5–15)
BUN: 18 mg/dL (ref 8–23)
CO2: 18 mmol/L — ABNORMAL LOW (ref 22–32)
Calcium: 7.8 mg/dL — ABNORMAL LOW (ref 8.9–10.3)
Chloride: 104 mmol/L (ref 98–111)
Creatinine: 1.03 mg/dL — ABNORMAL HIGH (ref 0.44–1.00)
GFR, Estimated: 57 mL/min — ABNORMAL LOW (ref >60)
Glucose, Bld: 156 mg/dL — ABNORMAL HIGH (ref 70–99)
Potassium: 4.1 mmol/L (ref 3.5–5.1)
Sodium: 139 mmol/L (ref 135–145)
Total Bilirubin: 0.5 mg/dL (ref 0.3–1.2)
Total Protein: 5.9 g/dL — ABNORMAL LOW (ref 6.5–8.1)

## 2021-11-05 LAB — MAGNESIUM: Magnesium: 0.7 mg/dL — CL (ref 1.7–2.4)

## 2021-11-05 LAB — SAMPLE TO BLOOD BANK

## 2021-11-05 MED ORDER — MAGNESIUM SULFATE 4 GM/100ML IV SOLN
4.0000 g | Freq: Once | INTRAVENOUS | Status: AC
  Administered 2021-11-05: 10:00:00 4 g via INTRAVENOUS
  Filled 2021-11-05: qty 100

## 2021-11-05 MED ORDER — MAGNESIUM SULFATE 4 GM/100ML IV SOLN: 4.0000 g | Freq: Once | INTRAVENOUS | Status: DC

## 2021-11-05 MED ORDER — SODIUM CHLORIDE 0.9 % IV SOLN: Freq: Once | INTRAVENOUS | Status: AC

## 2021-11-05 MED ORDER — INFLUENZA VAC A&B SA ADJ QUAD 0.5 ML IM PRSY
0.5000 mL | PREFILLED_SYRINGE | Freq: Once | INTRAMUSCULAR | Status: AC
  Administered 2021-11-05: 11:00:00 0.5 mL via INTRAMUSCULAR
  Filled 2021-11-05: qty 0.5

## 2021-11-05 MED ORDER — SODIUM CHLORIDE 0.9% FLUSH
10.0000 mL | Freq: Once | INTRAVENOUS | Status: AC
  Administered 2021-11-05: 12:00:00 10 mL via INTRAVENOUS

## 2021-11-05 MED ORDER — HEPARIN SOD (PORK) LOCK FLUSH 100 UNIT/ML IV SOLN
500.0000 [IU] | Freq: Once | INTRAVENOUS | Status: AC
  Administered 2021-11-05: 12:00:00 500 [IU] via INTRAVENOUS

## 2021-11-05 NOTE — Patient Instructions (Signed)
Hypomagnesemia Hypomagnesemia is a condition in which the level of magnesium in the blood is too low. Magnesium is a mineral that is found in many foods. It is used in many different processes in the body. Hypomagnesemia can affect every organ in the body. In severe cases, it can cause life-threatening problems. What are the causes? This condition may be caused by: Not getting enough magnesium in your diet or not having enough healthy foods to eat (malnutrition). Problems with magnesium absorption in the intestines. Dehydration. Excessive use of alcohol. Vomiting. Severe or long-term (chronic) diarrhea. Some medicines, including medicines that make you urinate more often (diuretics). Certain diseases, such as kidney disease, diabetes, celiac disease, and overactive thyroid. What are the signs or symptoms? Symptoms of this condition include: Loss of appetite, nausea, and vomiting. Involuntary shaking or trembling of a body part (tremor). Muscle weakness or tingling in the arms and legs. Sudden tightening of muscles (muscle spasms). Confusion. Psychiatric issues, such as: Depression and irritability. Psychosis. A feeling of fluttering of the heart (palpitations). Seizures. These symptoms are more severe if magnesium levels drop suddenly. How is this diagnosed? This condition may be diagnosed based on: Your symptoms and medical history. A physical exam. Blood and urine tests. How is this treated? Treatment depends on the cause and the severity of the condition. It may be treated by: Taking a magnesium supplement. This can be taken in pill form. If the condition is severe, magnesium is usually given through an IV. Making changes to your diet. You may be directed to eat foods that have a lot of magnesium, such as green leafy vegetables, peas, beans, and nuts. Not drinking alcohol. If you are struggling not to drink, ask your health care provider for help. Follow these instructions at  home: Eating and drinking   Make sure that your diet includes foods with magnesium. Foods that have a lot of magnesium in them include: Green leafy vegetables, such as spinach and broccoli. Beans and peas. Nuts and seeds, such as almonds and sunflower seeds. Whole grains, such as whole grain bread and fortified cereals. Drink fluids that contain salts and minerals (electrolytes), such as sports drinks, when you are active. Do not drink alcohol. General instructions Take over-the-counter and prescription medicines only as told by your health care provider. Take magnesium supplements as directed if your health care provider tells you to take them. Have your magnesium levels monitored as told by your health care provider. Keep all follow-up visits. This is important. Contact a health care provider if: You get worse instead of better. Your symptoms return. Get help right away if: You develop severe muscle weakness. You have trouble breathing. You feel that your heart is racing. These symptoms may represent a serious problem that is an emergency. Do not wait to see if the symptoms will go away. Get medical help right away. Call your local emergency services (911 in the [place] Do not drive yourself to the hospital. Summary Hypomagnesemia is a condition in which the level of magnesium in the blood is too low. Hypomagnesemia can affect every organ in the body. Treatment may include eating more foods that contain magnesium, taking magnesium supplements, and not drinking alcohol. Have your magnesium levels monitored as told by your health care provider. This information is not intended to replace advice given to you by your health care provider. Make sure you discuss any questions you have with your health care provider. Document Revised: 04/02/2021 Document Reviewed: 04/02/2021 Elsev Influenza Virus Vaccine injection  What is this medication? INFLUENZA VIRUS VACCINE (in floo EN zuh VAHY ruhs  vak SEEN) helps to reduce the risk of getting influenza also known as the flu. The vaccine only helps protect you against some strains of the flu. This medicine may be used for other purposes; ask your health care provider or pharmacist if you have questions. COMMON BRAND NAME(S): Afluria, Afluria Quadrivalent, Agriflu, Alfuria, FLUAD, FLUAD Quadrivalent, Fluarix, Fluarix Quadrivalent, Flublok, Flublok Quadrivalent, FLUCELVAX, FLUCELVAX Quadrivalent, Flulaval, Flulaval Quadrivalent, Fluvirin, Fluzone, Fluzone High-Dose, Fluzone Intradermal, Fluzone Quadrivalent What should I tell my care team before I take this medication? They need to know if you have any of these conditions: bleeding disorder like hemophilia fever or infection Guillain-Barre syndrome or other neurological problems immune system problems infection with the human immunodeficiency virus (HIV) or AIDS low blood platelet counts multiple sclerosis an unusual or allergic reaction to influenza virus vaccine, latex, other medicines, foods, dyes, or preservatives. Different brands of vaccines contain different allergens. Some may contain latex or eggs. Talk to your doctor about your allergies to make sure that you get the right vaccine. pregnant or trying to get pregnant breast-feeding How should I use this medication? This vaccine is for injection into a muscle or under the skin. It is given by a health care professional. A copy of Vaccine Information Statements will be given before each vaccination. Read this sheet carefully each time. The sheet may change frequently. Talk to your healthcare provider to see which vaccines are right for you. Some vaccines should not be used in all age groups. Overdosage: If you think you have taken too much of this medicine contact a poison control center or emergency room at once. NOTE: This medicine is only for you. Do not share this medicine with others. What if I miss a dose? This does not  apply. What may interact with this medication? chemotherapy or radiation therapy medicines that lower your immune system like etanercept, anakinra, infliximab, and adalimumab medicines that treat or prevent blood clots like warfarin phenytoin steroid medicines like prednisone or cortisone theophylline vaccines This list may not describe all possible interactions. Give your health care provider a list of all the medicines, herbs, non-prescription drugs, or dietary supplements you use. Also tell them if you smoke, drink alcohol, or use illegal drugs. Some items may interact with your medicine. What should I watch for while using this medication? Report any side effects that do not go away within 3 days to your doctor or health care professional. Call your health care provider if any unusual symptoms occur within 6 weeks of receiving this vaccine. You may still catch the flu, but the illness is not usually as bad. You cannot get the flu from the vaccine. The vaccine will not protect against colds or other illnesses that may cause fever. The vaccine is needed every year. What side effects may I notice from receiving this medication? Side effects that you should report to your doctor or health care professional as soon as possible: allergic reactions like skin rash, itching or hives, swelling of the face, lips, or tongue Side effects that usually do not require medical attention (report to your doctor or health care professional if they continue or are bothersome): fever headache muscle aches and pains pain, tenderness, redness, or swelling at the injection site tiredness This list may not describe all possible side effects. Call your doctor for medical advice about side effects. You may report side effects to FDA at 1-800-FDA-1088. Where should I  keep my medication? The vaccine will be given by a health care professional in a clinic, pharmacy, doctor's office, or other health care setting. You  will not be given vaccine doses to store at home. NOTE: This sheet is a summary. It may not cover all possible information. If you have questions about this medicine, talk to your doctor, pharmacist, or health care provider.  2022 Elsevier/Gold Standard (2021-07-23 00:00:00)

## 2021-11-05 NOTE — Addendum Note (Signed)
Addended by: Roselind Messier A on: 11/05/2021 09:18 AM   Modules accepted: Orders

## 2021-11-05 NOTE — Progress Notes (Signed)
[name] OFFICE PROGRESS NOTE   Diagnosis: Rectal cancer  INTERVAL HISTORY:   [name] completed another cycle of FOLFOX on 10/22/2021.  She is here today with her son.  He reports she has been very weak since completing the last cycle of chemotherapy.  He does not feel she is able to undergo further chemotherapy treatment.  She reports intermittent nausea.  She has intermittent rectal bleeding.  She has pain in the feet.  No change in neuropathy symptoms.  She has difficulty ambulating.  The right lower leg is swollen.  Objective:  Vital signs in last 24 hours:  Blood pressure (!) 120/59, pulse 87, temperature 97.7 F (36.5 C), temperature source Oral, resp. rate 20, height 5' (1.524 m), weight 126 lb (57.2 kg), SpO2 100 %.    HEENT: No thrush or ulcers Resp: Lungs clear bilaterally Cardio: Regular rate and rhythm GI: No hepatomegaly, left lower quadrant colostomy Vascular: Trace edema with faint erythema at the right greater than left lower leg, no palpable cord, no tenderness Neuro: Alert and oriented, 4/5 strength in both legs, was able to ambulate to the exam table with assistance    Portacath/PICC-without erythema  Lab Results:  Lab Results  Component Value Date   WBC 9.9 11/05/2021   HGB 8.4 (L) 11/05/2021   HCT 27.5 (L) 11/05/2021   MCV 105.8 (H) 11/05/2021   PLT 116 (L) 11/05/2021   NEUTROABS 7.6 11/05/2021    CMP  Lab Results  Component Value Date   NA 139 11/05/2021   K 4.1 11/05/2021   CL 104 11/05/2021   CO2 18 (L) 11/05/2021   GLUCOSE 156 (H) 11/05/2021   BUN 18 11/05/2021   CREATININE 1.03 (H) 11/05/2021   CALCIUM 7.8 (L) 11/05/2021   PROT 5.9 (L) 11/05/2021   ALBUMIN 3.2 (L) 11/05/2021   AST 11 (L) 11/05/2021   ALT 6 11/05/2021   ALKPHOS 194 (H) 11/05/2021   BILITOT 0.5 11/05/2021   GFRNONAA 57 (L) 11/05/2021   GFRAA >60 03/29/2020    Lab Results  Component Value Date   CEA1 28.60 (H) 06/12/2021   CEA 9.96 (H)  09/02/2021     Medications: I have reviewed the patient's current medications.   Assessment/Plan:  Rectal cancer Partially obstructing mass beginning at 10 cm from the anal verge on colonoscopy 01/23/2021, biopsy-adenocarcinoma, immunohistochemical stains at [facility]-CK20, CDX-2, and SATB2 positive, patchy strong staining for p16.  The rectal tumor has a distinct immunohistochemical profile suggesting a primary colorectal tumor as opposed to metastatic cervical cancer. CT abdomen/pelvis 01/18/2021-bilateral hydronephrosis, endometrial fluid collection, prominent stool in the colon MRI pelvis 01/23/2021-tumor at 7.6 cm from the anal verge, abnormal signal bridge in the cervix, mesorectum, and anterior aspect of the rectum-similar to MRI from 2020, MRI stage could not be defined secondary to posttreatment changes in the pelvis 03/18/2021-total abdominal hysterectomy, cystoscopy with insertion of ureteral stents, exploratory laparotomy, aborted low anterior resection, descending loop colostomy--right and left fallopian tubes and ovaries negative for malignancy; excision portion of cervix positive for adenocarcinoma; uterus with invasive moderately differentiated adenocarcinoma of the cervix, HPV associated, invading through full-thickness of the cervix/lower uterine segment and into parametrial tissue, margins of resection received disrupted, cannot be evaluated; remaining uterus with extensive endocervical adenocarcinoma in situ involving lower uterine segment and replacing endometrium.  This carcinoma was felt to be a separate primary from the rectal tumor. PET scan at [facility] 04/11/2021-intense FDG activity in region of known rectal mass.  Hypermetabolic left external iliac  and right inguinal lymph nodes.  Minimal FDG uptake and small right supraclavicular lymph node.  No uptake seen in the cervix. 04/23/2021 FNA right inguinal lymph node-negative for malignancy Evaluated by Dr. Leamon Arnt 05/21/2021, 05/28/2021-appears to  have 2 synchronous malignancies (cervical adenocarcinoma and rectal adenocarcinoma); further surgery which would entail pelvic exenteration not recommended by gynecologic oncology or colorectal surgery.  Full doses of radiotherapy not felt to be feasible.  Recommended 3 months of CAPOX and then repeat MRI of abdomen/pelvis, chest CT and colonoscopy. Cycle 1 Xeloda 06/13/2021 Cycle 1 FOLFOX 07/08/2021, oxaliplatin dose reduced secondary to pre-existing neuropathy Cycle 2 FOLFOX 07/29/2021, Udenyca Cycle 3 FOLFOX 08/12/2021, Udenyca Cycle 4 FOLFOX 09/02/2021, Udenyca Cycle 5 FOLFOX 09/17/2021, Udenyca CTs at [facility] 10/01/2021-unchanged findings of rectal malignancy status posttreatment.  Marked thickening of the proximal duodenal wall.  Unchanged bilateral inguinal lymph nodes which are not enlarged but avid on prior PET.  Unchanged diffuse bladder wall thickening and perivesicular stranding which may represent radiation cystitis.  New mild right lower lobe bronchial wall thickening, mucus impaction and tree-in-bud pulmonary nodules. Cycle 6 FOLFOX 10/22/2021, Udenyca     Bilateral hydronephrosis-secondary to urinary retention? Hematometra found on pelvic MRI 01/23/2021, evaluated by gynecologic oncology Cervical cancer November 1994, stage Ib, treated with external beam radiation and brachytherapy at [facility] Diabetes Neuropathy Chronic pain secondary to #6 Recurrent urinary tract infections History of a CVA History of hyperthyroidism G3, P3 COVID-19 infection January 2021 Admission 08/25/2021 with a urinary tract infection-culture negative Anemia secondary to chronic disease, chemotherapy, and rectal bleeding-1 unit packed red blood cells 08/26/2021 Hospital admission 09/23/2021-symptomatic anemia 16.  Admission 10/08/2021 with altered mental status, nausea/vomiting, and left foot ulcers Urine culture 10/08/2021 positive for Pseudomonas aeruginosa EGD 10/12/2021-gastric and duodenal ulcers-not bleeding      Disposition: [name] has a history of rectal cancer and adenocarcinoma of the cervix.  She has completed 6 cycles of FOLFOX.  Restaging CTs at [facility] 10/01/2021 revealed stable disease.  She is not a surgical candidate.  [name] has a poor performance status.  She appears too weak to receive further chemotherapy.  FOLFOX was placed on hold today.  She will be referred for a Doppler of the right leg to rule out a deep vein thrombosis.  [name] will return for an office visit and further discussion in approximately 3 weeks.  She will receive IV magnesium and an influenza vaccine today.  We will consider transitioning to comfort/hospice care versus salvage systemic therapy depending on her performance status in 3 weeks.  Betsy Coder, MD  11/05/2021  12:10 PM

## 2021-11-05 NOTE — Progress Notes (Signed)
CRITICAL VALUE STICKER  CRITICAL VALUE: Mg+ 0.7  RECEIVER (on-site recipient of call):Ciaira Natividad,RN  DATE & TIME NOTIFIED: 11/05/21 @ 0907  MESSENGER (representative from lab):Otila Kluver  MD NOTIFIED: Dr. Benay Spice  TIME OF NOTIFICATION:0910  RESPONSE: IV Mg+ today

## 2021-11-05 NOTE — Progress Notes (Signed)
Per Danae Orleans, LPN patient will not receive treatment today but will receive Magnesium for 0.7 magnesium level. Pt will also have doppler on the right leg.

## 2021-11-07 ENCOUNTER — Other Ambulatory Visit: Payer: Self-pay

## 2021-11-07 ENCOUNTER — Emergency Department (HOSPITAL_COMMUNITY): Payer: Medicare HMO

## 2021-11-07 ENCOUNTER — Emergency Department (HOSPITAL_COMMUNITY)
Admission: EM | Admit: 2021-11-07 | Discharge: 2021-11-07 | Disposition: A | Discharge status: Home / Self Care | Payer: Medicare HMO | Attending: Emergency Medicine | Admitting: Emergency Medicine

## 2021-11-07 ENCOUNTER — Telehealth: Payer: Self-pay

## 2021-11-07 ENCOUNTER — Inpatient Hospital Stay: Payer: Medicare HMO

## 2021-11-07 DIAGNOSIS — Z7984 Long term (current) use of oral hypoglycemic drugs: Secondary | ICD-10-CM | POA: Insufficient documentation

## 2021-11-07 DIAGNOSIS — N3000 Acute cystitis without hematuria: Secondary | ICD-10-CM | POA: Insufficient documentation

## 2021-11-07 DIAGNOSIS — Z7902 Long term (current) use of antithrombotics/antiplatelets: Secondary | ICD-10-CM | POA: Insufficient documentation

## 2021-11-07 DIAGNOSIS — R404 Transient alteration of awareness: Secondary | ICD-10-CM | POA: Insufficient documentation

## 2021-11-07 DIAGNOSIS — R4182 Altered mental status, unspecified: Secondary | ICD-10-CM | POA: Diagnosis not present

## 2021-11-07 DIAGNOSIS — Z87891 Personal history of nicotine dependence: Secondary | ICD-10-CM | POA: Diagnosis not present

## 2021-11-07 DIAGNOSIS — S12400A Unspecified displaced fracture of fifth cervical vertebra, initial encounter for closed fracture: Secondary | ICD-10-CM | POA: Insufficient documentation

## 2021-11-07 DIAGNOSIS — Z79899 Other long term (current) drug therapy: Secondary | ICD-10-CM | POA: Insufficient documentation

## 2021-11-07 DIAGNOSIS — Z7982 Long term (current) use of aspirin: Secondary | ICD-10-CM | POA: Insufficient documentation

## 2021-11-07 DIAGNOSIS — S129XXA Fracture of neck, unspecified, initial encounter: Secondary | ICD-10-CM

## 2021-11-07 DIAGNOSIS — Z20822 Contact with and (suspected) exposure to covid-19: Secondary | ICD-10-CM | POA: Insufficient documentation

## 2021-11-07 DIAGNOSIS — E162 Hypoglycemia, unspecified: Secondary | ICD-10-CM | POA: Diagnosis not present

## 2021-11-07 DIAGNOSIS — R410 Disorientation, unspecified: Secondary | ICD-10-CM | POA: Diagnosis not present

## 2021-11-07 DIAGNOSIS — Z96642 Presence of left artificial hip joint: Secondary | ICD-10-CM | POA: Diagnosis not present

## 2021-11-07 DIAGNOSIS — E039 Hypothyroidism, unspecified: Secondary | ICD-10-CM | POA: Diagnosis not present

## 2021-11-07 DIAGNOSIS — R300 Dysuria: Secondary | ICD-10-CM | POA: Diagnosis present

## 2021-11-07 DIAGNOSIS — W19XXXA Unspecified fall, initial encounter: Secondary | ICD-10-CM | POA: Diagnosis not present

## 2021-11-07 DIAGNOSIS — Z743 Need for continuous supervision: Secondary | ICD-10-CM | POA: Diagnosis not present

## 2021-11-07 DIAGNOSIS — H1132 Conjunctival hemorrhage, left eye: Secondary | ICD-10-CM | POA: Insufficient documentation

## 2021-11-07 DIAGNOSIS — Z8541 Personal history of malignant neoplasm of cervix uteri: Secondary | ICD-10-CM | POA: Diagnosis not present

## 2021-11-07 DIAGNOSIS — E161 Other hypoglycemia: Secondary | ICD-10-CM | POA: Diagnosis not present

## 2021-11-07 DIAGNOSIS — R9431 Abnormal electrocardiogram [ECG] [EKG]: Secondary | ICD-10-CM | POA: Diagnosis not present

## 2021-11-07 LAB — URINALYSIS, ROUTINE W REFLEX MICROSCOPIC
Bilirubin Urine: NEGATIVE
Glucose, UA: NEGATIVE mg/dL
Ketones, ur: 5 mg/dL — AB
Nitrite: POSITIVE — AB
Protein, ur: NEGATIVE mg/dL
Specific Gravity, Urine: 1.01 (ref 1.005–1.030)
pH: 6 (ref 5.0–8.0)

## 2021-11-07 LAB — CBC WITH DIFFERENTIAL/PLATELET
Abs Immature Granulocytes: 0.08 10*3/uL — ABNORMAL HIGH (ref 0.00–0.07)
Basophils Absolute: 0 10*3/uL (ref 0.0–0.1)
Basophils Relative: 0 %
Eosinophils Absolute: 0 10*3/uL (ref 0.0–0.5)
Eosinophils Relative: 0 %
HCT: 25.3 % — ABNORMAL LOW (ref 36.0–46.0)
Hemoglobin: 7.5 g/dL — ABNORMAL LOW (ref 12.0–15.0)
Immature Granulocytes: 1 %
Lymphocytes Relative: 8 %
Lymphs Abs: 0.6 10*3/uL — ABNORMAL LOW (ref 0.7–4.0)
MCH: 32.6 pg (ref 26.0–34.0)
MCHC: 29.6 g/dL — ABNORMAL LOW (ref 30.0–36.0)
MCV: 110 fL — ABNORMAL HIGH (ref 80.0–100.0)
Monocytes Absolute: 0.6 10*3/uL (ref 0.1–1.0)
Monocytes Relative: 8 %
Neutro Abs: 6.1 10*3/uL (ref 1.7–7.7)
Neutrophils Relative %: 83 %
Platelets: 121 10*3/uL — ABNORMAL LOW (ref 150–400)
RBC: 2.3 MIL/uL — ABNORMAL LOW (ref 3.87–5.11)
RDW: 21.9 % — ABNORMAL HIGH (ref 11.5–15.5)
WBC: 7.4 10*3/uL (ref 4.0–10.5)
nRBC: 0 % (ref 0.0–0.2)

## 2021-11-07 LAB — TROPONIN I (HIGH SENSITIVITY)
Troponin I (High Sensitivity): 6 ng/L (ref <18)
Troponin I (High Sensitivity): 6 ng/L (ref <18)

## 2021-11-07 LAB — COMPREHENSIVE METABOLIC PANEL
ALT: 9 U/L (ref 0–44)
AST: 14 U/L — ABNORMAL LOW (ref 15–41)
Albumin: 2.3 g/dL — ABNORMAL LOW (ref 3.5–5.0)
Alkaline Phosphatase: 175 U/L — ABNORMAL HIGH (ref 38–126)
Anion gap: 13 (ref 5–15)
BUN: 17 mg/dL (ref 8–23)
CO2: 19 mmol/L — ABNORMAL LOW (ref 22–32)
Calcium: 8.4 mg/dL — ABNORMAL LOW (ref 8.9–10.3)
Chloride: 106 mmol/L (ref 98–111)
Creatinine, Ser: 0.95 mg/dL (ref 0.44–1.00)
GFR, Estimated: >60 mL/min (ref >60)
Glucose, Bld: 97 mg/dL (ref 70–99)
Potassium: 4.5 mmol/L (ref 3.5–5.1)
Sodium: 138 mmol/L (ref 135–145)
Total Bilirubin: 0.7 mg/dL (ref 0.3–1.2)
Total Protein: 5.7 g/dL — ABNORMAL LOW (ref 6.5–8.1)

## 2021-11-07 LAB — MAGNESIUM: Magnesium: 1.5 mg/dL — ABNORMAL LOW (ref 1.7–2.4)

## 2021-11-07 LAB — RESP PANEL BY RT-PCR (FLU A&B, COVID) ARPGX2
Influenza A by PCR: NEGATIVE
Influenza B by PCR: NEGATIVE
SARS Coronavirus 2 by RT PCR: NEGATIVE

## 2021-11-07 LAB — CBG MONITORING, ED: Glucose-Capillary: 77 mg/dL (ref 70–99)

## 2021-11-07 MED ORDER — CIPROFLOXACIN HCL 500 MG PO TABS: 500.0000 mg | ORAL_TABLET | Freq: Two times a day (BID) | ORAL | 0 refills | Status: DC

## 2021-11-07 MED ORDER — LACTATED RINGERS IV BOLUS
500.0000 mL | Freq: Once | INTRAVENOUS | Status: AC
  Administered 2021-11-07: 12:00:00 500 mL via INTRAVENOUS

## 2021-11-07 MED ORDER — SODIUM CHLORIDE 0.9 % IV SOLN
1.0000 g | Freq: Once | INTRAVENOUS | Status: AC
  Administered 2021-11-07: 14:00:00 1 g via INTRAVENOUS
  Filled 2021-11-07: qty 10

## 2021-11-07 MED ORDER — HEPARIN SOD (PORK) LOCK FLUSH 100 UNIT/ML IV SOLN
500.0000 [IU] | Freq: Once | INTRAVENOUS | Status: AC
  Administered 2021-11-07: 16:00:00 500 [IU]
  Filled 2021-11-07: qty 5

## 2021-11-07 NOTE — ED Notes (Signed)
Port deaccessed. Site clean, dry, intact.

## 2021-11-07 NOTE — ED Triage Notes (Signed)
Endorses pain with urination and urgency/frequency

## 2021-11-07 NOTE — Telephone Encounter (Signed)
Patient daughter called to inform Dr. Benay Spice, [name] was in the ER at [facility] for an UTI infection. Advice the patient to drink plenty fluids and completed her antibiotics. Patient daughter stated she will follow up her  urologist. Patient voiced understanding

## 2021-11-07 NOTE — ED Triage Notes (Signed)
EMS reports pt from home with increasing confusion and weakness x2 days, unable to have scheduled chemo 2 days ago due to same. Consistent with UTI in the past but denies urinary sx. A&Ox2, usually A&Ox4 and ambulatory.  BP 139/54 HR 74 SpO2 99% CBG 76

## 2021-11-07 NOTE — ED Provider Notes (Signed)
[place] EMERGENCY DEPARTMENT Provider Note  CSN: [id number] Arrival date & time: 11/07/21 5732    History Chief Complaint  Patient presents with   Altered Mental Status   Dysuria    [name] is a 75 y.o. female with history of rectal and cervical cancer brought by EMS from home with AMS and general weakness for the last several days. She was unable to get chemo at her visit earlier this week due to poor functional status. She also was noted to have low Mg. She had a fall recently with a head injury, complaining of some neck discomfort and blood in L eye. She has not had a fever or cough. No N/V. She denies dysuria to me, but told RN she has had some dysuria. She reports frequency. Son at bedside reports she has had similar symptoms in the past from UTI.    Past Medical History:  Diagnosis Date   Arthritis    Basal ganglia stroke ([place])    Bilateral lower extremity edema    Cancer (HCC)    Carotid artery occlusion    Flank pain    Full dentures    History of cervical cancer    11/ 1994  Stage IB  s/p  high dose radiation brachytherapy @ [facility] 01/ 1995--- per pt no recurrence   History of chronic bronchitis    History of hyperthyroidism    d 03/ 2011---due to grave's disease--- s/p  RAI treatment 04/ 2011   History of kidney stones    History of sepsis 05/03/2018   due to UTI with klebsiella/ pyelonephritis/ ureteral obstruction cause by stone   Hyperlipidemia    Hypothyroidism, postradioiodine therapy    endocrinologist--  dr Loanne Drilling--  dx graves disease and s/p RAI i131 treatement 4/ 2011   Nauseated    Oral thrush 05/03/2018   Pneumonia    Type 2 diabetes mellitus treated with insulin ([employer])    FOLLOWED BY PCP   Urgency of urination    Wears glasses     Past Surgical History:  Procedure Laterality Date   APPENDECTOMY     BIOPSY  01/23/2021   Procedure: BIOPSY;  Surgeon: Mauri Pole, MD;  Location: [facility];  Service: Endoscopy;;   BIOPSY   10/12/2021   Procedure: BIOPSY;  Surgeon: Thornton Park, MD;  Location: [facility];  Service: Gastroenterology;;   CAROTID ENDARTERECTOMY Right 03/28/2020   CATARACT EXTRACTION W/ INTRAOCULAR LENS  IMPLANT, BILATERAL  2004  approx.   COLONOSCOPY WITH PROPOFOL N/A 01/23/2021   Procedure: COLONOSCOPY WITH PROPOFOL;  Surgeon: Mauri Pole, MD;  Location: [facility];  Service: Endoscopy;  Laterality: N/A;   CYSTOSCOPY WITH STENT PLACEMENT Right 05/04/2018   Procedure: CYSTOSCOPY WITH STENT PLACEMENT AND RETROGRADE PYELOGRAM;  Surgeon: Ceasar Mons, MD;  Location: [facility];  Service: Urology;  Laterality: Right;   CYSTOSCOPY/URETEROSCOPY/HOLMIUM LASER/STENT PLACEMENT Right 05/19/2018   Procedure: CYSTOSCOPY, URETEROSCOPY/HOLMIUM LASER, STONE BASKETRY/ STENT EXCHANGE;  Surgeon: Ceasar Mons, MD;  Location: [facility];  Service: Urology;  Laterality: Right;   ENDARTERECTOMY Right 03/28/2020   Procedure: RIGHT CAROTID ENDARTERECTOMY with PATCH ANGIOPLASTY;  Surgeon: Rosetta Posner, MD;  Location: [facility];  Service: Vascular;  Laterality: Right;   ESOPHAGOGASTRODUODENOSCOPY (EGD) WITH PROPOFOL N/A 10/12/2021   Procedure: ESOPHAGOGASTRODUODENOSCOPY (EGD) WITH PROPOFOL;  Surgeon: Thornton Park, MD;  Location: [facility];  Service: Gastroenterology;  Laterality: N/A;   EXCISION MASS LEFT CHEST WALL  09-20-2007   dr [place] Silos  [facility]  Radioactive Iodine Therapy     for thyroid   REVISION TOTAL HIP ARTHROPLASTY Left early 2000s   SUBMUCOSAL TATTOO INJECTION  01/23/2021   Procedure: SUBMUCOSAL TATTOO INJECTION;  Surgeon: Mauri Pole, MD;  Location: [facility];  Service: Endoscopy;;   TANDEM RING INSERTION  1995   dr Aldean Ast @ [facility]   EUA w/ tandem placement in ovid  (for direct high dose radiation brachytherapy , cervical cancer)   TOTAL HIP ARTHROPLASTY Left 1990s   TRANSTHORACIC ECHOCARDIOGRAM  08/07/2017   mild focal basal hypertrophy of the  septum,  ef 20-35%, grade 1 diastolic dysfunction/  atrial septum with lipomatous hypertrophy/  trivial PR    Family History  Problem Relation Age of Onset   Emphysema Father    Diabetes Father    Emphysema Sister    Emphysema Brother    Cancer Mother        Skin Cancer   Diabetes Mother    Colon cancer Neg Hx    Esophageal cancer Neg Hx    Rectal cancer Neg Hx    Stomach cancer Neg Hx     Social History   Tobacco Use   Smoking status: Former    Years: 5.00    Types: Cigarettes    Quit date: 05/13/1982    Years since quitting: 39.5   Smokeless tobacco: Never  Vaping Use   Vaping Use: Never used  Substance Use Topics   Alcohol use: No   Drug use: No     Home Medications Prior to Admission medications   Medication Sig Start Date End Date Taking? Authorizing Provider  ciprofloxacin (CIPRO) 500 MG tablet Take 1 tablet (500 mg total) by mouth every 12 (twelve) hours. 11/07/21  Yes Truddie Hidden, MD  aspirin 81 MG EC tablet Take 1 tablet (81 mg total) by mouth daily. RESUME AFTER 1 WEEK Patient taking differently: Take 81 mg by mouth every morning. 09/03/21   Bonnielee Haff, MD  atorvastatin (LIPITOR) 40 MG tablet Take 40 mg by mouth every morning. 12/15/19   [provider]  baclofen (LIORESAL) 10 MG tablet Take 10 mg by mouth 4 (four) times daily as needed for muscle spasms. 03/22/21   [provider]  cephALEXin (KEFLEX) 250 MG capsule Take 1 capsule (250 mg total) by mouth daily. RESUME AFTER YOU HAVE COMPLETED THE COURSE OF CIPRO Patient taking differently: Take 250 mg by mouth every morning. 08/28/21   Bonnielee Haff, MD  clopidogrel (PLAVIX) 75 MG tablet Take 1 tablet (75 mg total) by mouth at bedtime. 10/12/21   Shawna Clamp, MD  docusate sodium (COLACE) 100 MG capsule Take 100 mg by mouth 2 (two) times daily as needed for mild constipation.    [provider]  ferrous sulfate 325 (65 FE) MG tablet Take 1 tablet (325 mg total) by mouth  2 (two) times daily with a meal. 08/28/21 11/26/21  Bonnielee Haff, MD  fluconazole (DIFLUCAN) 100 MG tablet Take 1 tablet (100 mg total) by mouth daily. Take 2 tabs on first day, then 1 tab for 9 more days. 10/16/21   Thornton Park, MD  gabapentin (NEURONTIN) 300 MG capsule Take 300-600 mg by mouth See admin instructions. Take one capsule (300 mg) by mouth every morning and two capsules (600 mg) at night    [provider]  gemfibrozil (LOPID) 600 MG tablet Take 600 mg by mouth 2 (two) times daily.    [provider]  insulin degludec (TRESIBA FLEXTOUCH) 100 UNIT/ML FlexTouch  Pen Inject 10 Units into the skin daily. Patient taking differently: Inject 20 Units into the skin daily as needed (CBG >200). 08/28/21   Bonnielee Haff, MD  Lancets [facility] DELICA PLUS [id number]) MISC Apply topically 2 (two) times daily. 12/02/20   [provider]  levothyroxine (SYNTHROID) 125 MCG tablet Take 1 tablet (125 mcg total) by mouth daily at 6 (six) AM. 08/29/21 11/27/21  Bonnielee Haff, MD  metFORMIN (GLUCOPHAGE) 500 MG tablet Take 500 mg by mouth 2 (two) times daily. 02/01/21   [provider]  nystatin (MYCOSTATIN/NYSTOP) powder Apply 1 application topically 2 (two) times daily. To groin rash 07/29/21   Ladell Pier, MD  nystatin cream (MYCOSTATIN) Apply 1 application topically 2 (two) times daily as needed (groin rash). Mix with triamcinolone cream    [provider]  ondansetron (ZOFRAN) 8 MG tablet Take 1 tablet (8 mg total) by mouth every 6 (six) hours as needed for nausea or vomiting. 10/31/21   Esterwood, Amy S, PA-C  ONETOUCH VERIO test strip 1 each 2 (two) times daily. 11/29/20   [provider]  pantoprazole (PROTONIX) 40 MG tablet Take 1 tablet (40 mg total) by mouth 2 (two) times daily. 10/31/21   Esterwood, Amy S, PA-C  polyethylene glycol powder (GLYCOLAX/MIRALAX) 17 GM/SCOOP powder Take 17 g by mouth 2 (two) times daily as needed  (constipation).    [provider]  potassium chloride SA (KLOR-CON) 20 MEQ tablet Take 1 tablet (20 mEq total) by mouth 2 (two) times daily. 10/03/21   Ladell Pier, MD  prochlorperazine (COMPAZINE) 10 MG tablet Take 10 mg by mouth every 6 (six) hours as needed for nausea or vomiting.    [provider]  triamcinolone cream (KENALOG) 0.1 % Apply 1 application topically 2 (two) times daily as needed (groin rash). Mix with nystatin cream 09/19/20   [provider]  XTAMPZA ER 9 MG C12A Take 1 capsule by mouth 2 (two) times daily. 10/31/21   Esterwood, Amy S, PA-C     Allergies    Codeine   Review of Systems   Review of Systems A comprehensive review of systems was completed and negative except as noted in HPI.    Physical Exam BP (!) 123/43    Pulse 73    Temp 97.6 F (36.4 C) (Oral)    Resp 11    SpO2 100%   Physical Exam Vitals and nursing note reviewed.  Constitutional:      Appearance: Normal appearance.  HENT:     Head: Normocephalic and atraumatic.     Nose: Nose normal.     Mouth/Throat:     Mouth: Mucous membranes are moist.  Eyes:     Extraocular Movements: Extraocular movements intact.     Comments: L subconjunctival hemorrhage  Cardiovascular:     Rate and Rhythm: Normal rate.  Pulmonary:     Effort: Pulmonary effort is normal.     Breath sounds: Normal breath sounds.  Abdominal:     General: Abdomen is flat.     Palpations: Abdomen is soft.     Tenderness: There is no abdominal tenderness. There is no guarding.     Comments: Colostomy with brown stool  Musculoskeletal:        General: No swelling. Normal range of motion.     Cervical back: Neck supple. No tenderness.     Right lower leg: Edema present.     Left lower leg: No edema.  Skin:  General: Skin is warm and dry.  Neurological:     General: No focal deficit present.     Mental Status: She is alert. She is disoriented.     Cranial Nerves: No cranial nerve deficit.      Motor: Weakness (generalized, no focal deficit) present.  Psychiatric:        Mood and Affect: Mood normal.     ED Results / Procedures / Treatments   Labs (all labs ordered are listed, but only abnormal results are displayed) Labs Reviewed  COMPREHENSIVE METABOLIC PANEL - Abnormal; Notable for the following components:      Result Value   CO2 19 (*)    Calcium 8.4 (*)    Total Protein 5.7 (*)    Albumin 2.3 (*)    AST 14 (*)    Alkaline Phosphatase 175 (*)    All other components within normal limits  MAGNESIUM - Abnormal; Notable for the following components:   Magnesium 1.5 (*)    All other components within normal limits  URINALYSIS, ROUTINE W REFLEX MICROSCOPIC - Abnormal; Notable for the following components:   APPearance CLOUDY (*)    Hgb urine dipstick LARGE (*)    Ketones, ur 5 (*)    Nitrite POSITIVE (*)    Leukocytes,Ua LARGE (*)    Bacteria, UA MANY (*)    All other components within normal limits  CBC WITH DIFFERENTIAL/PLATELET - Abnormal; Notable for the following components:   RBC 2.30 (*)    Hemoglobin 7.5 (*)    HCT 25.3 (*)    MCV 110.0 (*)    MCHC 29.6 (*)    RDW 21.9 (*)    Platelets 121 (*)    Lymphs Abs 0.6 (*)    Abs Immature Granulocytes 0.08 (*)    All other components within normal limits  RESP PANEL BY RT-PCR (FLU A&B, COVID) ARPGX2  URINE CULTURE  CBG MONITORING, ED  TROPONIN I (HIGH SENSITIVITY)  TROPONIN I (HIGH SENSITIVITY)    EKG EKG Interpretation  Date/Time:  Thursday November 07 2021 09:47:05 EST Ventricular Rate:  80 PR Interval:  112 QRS Duration: 95 QT Interval:  415 QTC Calculation: 479 R Axis:   43 Text Interpretation: Sinus rhythm Borderline short PR interval Borderline T wave abnormalities No significant change since last tracing Confirmed by Calvert Cantor [phone]) on 11/07/2021 9:58:07 AM  Radiology CT Head Wo Contrast  Result Date: 11/07/2021 CLINICAL DATA:  Head trauma, minor (Age >= 65y) Mental status  change, unknown cause EXAM: CT HEAD WITHOUT CONTRAST TECHNIQUE: Contiguous axial images were obtained from the base of the skull through the vertex without intravenous contrast. COMPARISON:  09/05/2021 FINDINGS: Brain: There is no acute intracranial hemorrhage, mass effect, or edema. Gray-white differentiation is preserved. Small chronic infarct of the left basal ganglia and adjacent white matter. Minimal patchy hypoattenuation in the supratentorial white matter is nonspecific but probably reflects stable minor chronic microvascular ischemic changes. There is no extra-axial fluid collection. Ventricles and sulci are stable in size and configuration. Vascular: There is atherosclerotic calcification at the skull base. Skull: Calvarium is unremarkable. Sinuses/Orbits: No acute finding. Other: None. IMPRESSION: No evidence of acute intracranial injury. Electronically Signed   By: Macy Mis M.D.   On: 11/07/2021 10:17   CT Cervical Spine Wo Contrast  Result Date: 11/07/2021 CLINICAL DATA:  Neck trauma (Age >= 65y) EXAM: CT CERVICAL SPINE WITHOUT CONTRAST TECHNIQUE: Multidetector CT imaging of the cervical spine was performed without intravenous contrast. Multiplanar CT image  reconstructions were also generated. COMPARISON:  None. FINDINGS: Alignment: Normal Skull base and vertebrae: There is a fracture through the spinous process at C5. No vertebral body fracture. Sclerotic focus in the C2 vertebral body. This was present on prior CTA neck from 02/11/2020. This could reflect bone island, but recommend correlation with PSA level. Soft tissues and spinal canal: No prevertebral fluid or swelling. No visible canal hematoma. Disc levels:  Diffuse degenerative disc disease and facet disease. Upper chest: No acute findings Other: None IMPRESSION: Fracture through the C5 spinous process posteriorly. No vertebral body fracture. Sclerotic focus in the C2 vertebral body which could reflect bone island but recommend  correlation with PSA level or cancer history to exclude sclerotic metastasis. This was present on prior CTA neck from 2021. Degenerative changes. Electronically Signed   By: Rolm Baptise M.D.   On: 11/07/2021 10:17    Procedures Procedures  Medications Ordered in the ED Medications  lactated ringers bolus 500 mL (0 mLs Intravenous Stopped 11/07/21 1326)  cefTRIAXone (ROCEPHIN) 1 g in sodium chloride 0.9 % 100 mL IVPB (1 g Intravenous New Bag/Given 11/07/21 1348)     MDM Rules/Calculators/A&P MDM Patient with AMS and general weakness. Also had  recent fall. Will check labs, UA, CT and reassess. She and family have discussed discontinuing further chemo and pursing hospice if her functional status does not improve.   ED Course  I have reviewed the triage vital signs and the nursing notes.  Pertinent labs & imaging results that were available during my care of the patient were reviewed by me and considered in my medical decision making (see chart for details).  Clinical Course as of 11/07/21 1519  Thu Nov 07, 2021  1007 CBC with anemia, worsened from previous, WBC normal.  [CS]  1020 CMP is similar to previous. Mg is improved. Trop neg.  [CS]  5456 CT head is neg for injury or acute process.  [CS]  2563 CT C-spine with C5 spinous process fx. Not in need of immobilization.  [CS]  1230 Covid/Flu are neg. Repeat Trop is neg.  [CS]  8937 UA is positive for UTI. Rocephin ordered. Prior culture was positive for pan-sensitive Pseudomonas. Patient and family at bedside aware of spinous process fx, will give a referral to [facility] for follow up. Discussed admission vs outpatient treatment for UTI with confusion. Patient and family would prefer to go home. Will plan Rx for Abx, close outpatient follow up and advised to RTED for any worsening.  [CS]    Clinical Course User Index [CS] Truddie Hidden, MD    Final Clinical Impression(s) / ED Diagnoses Final diagnoses:  Acute cystitis without  hematuria  Transient alteration of awareness  Closed fracture of spinous process of cervical vertebra, initial encounter ([place])  Subconjunctival hemorrhage of left eye    Rx / DC Orders ED Discharge Orders          Ordered    ciprofloxacin (CIPRO) 500 MG tablet  Every 12 hours        11/07/21 1407             Truddie Hidden, MD 11/07/21 [phone]

## 2021-11-08 ENCOUNTER — Telehealth: Payer: Self-pay

## 2021-11-08 NOTE — Telephone Encounter (Signed)
Called and left a message to [name]. Dr. Benay Spice was following up with [name]. UTI infection. Dr. Benay Spice stated that the patient is on the correct antibodies and follow up as schedule.

## 2021-11-09 LAB — URINE CULTURE: Culture: >=100000 — AB

## 2021-11-10 ENCOUNTER — Telehealth: Payer: Self-pay | Admitting: Emergency Medicine

## 2021-11-10 NOTE — Telephone Encounter (Signed)
Post ED Visit - Positive Culture Follow-up  Culture report reviewed by antimicrobial stewardship pharmacist: [place] Team []  Elenor Quinones, Pharm.D. []  Heide Guile, Pharm.D., BCPS AQ-ID []  Parks Neptune, Pharm.D., BCPS []  Alycia Rossetti, Pharm.D., BCPS []  [place], Pharm.D., BCPS, AAHIVP []  Legrand Como, Pharm.D., BCPS, AAHIVP []  Salome Arnt, PharmD, BCPS []  Johnnette Gourd, PharmD, BCPS []  Hughes Better, PharmD, BCPS []  Leeroy Cha, PharmD []  Laqueta Linden, PharmD, BCPS []  Albertina Parr, PharmD  [place] Team []  Leodis Sias, PharmD []  Lindell Spar, PharmD []  Royetta Asal, PharmD []  Graylin Shiver, Rph []  Rema Fendt) Glennon Mac, PharmD []  Arlyn Dunning, PharmD []  Netta Cedars, PharmD []  Dia Sitter, PharmD []  Leone Haven, PharmD [x]  Gretta Arab, PharmD []  Theodis Shove, PharmD []  Peggyann Juba, PharmD []  Reuel Boom, PharmD   Positive urine culture Treated with ciprofloxacin, organism sensitive to the same and no further patient follow-up is required at this time.  Hazle Nordmann 11/10/2021, 12:07 PM

## 2021-11-12 ENCOUNTER — Telehealth: Payer: Self-pay

## 2021-11-12 NOTE — Telephone Encounter (Signed)
Called and spoke with [name] I am just relied a message from Dr. Benay Spice, that her mother [name]  is on the correct medication, she need to drink plenty of fluid, and follow up with her urology

## 2021-11-13 ENCOUNTER — Encounter: Payer: Self-pay | Admitting: Podiatry

## 2021-11-13 ENCOUNTER — Ambulatory Visit (INDEPENDENT_AMBULATORY_CARE_PROVIDER_SITE_OTHER): Payer: Medicare HMO | Admitting: Podiatry

## 2021-11-13 ENCOUNTER — Other Ambulatory Visit: Payer: Self-pay

## 2021-11-13 DIAGNOSIS — E08621 Diabetes mellitus due to underlying condition with foot ulcer: Secondary | ICD-10-CM | POA: Diagnosis not present

## 2021-11-13 DIAGNOSIS — E1142 Type 2 diabetes mellitus with diabetic polyneuropathy: Secondary | ICD-10-CM | POA: Diagnosis not present

## 2021-11-13 DIAGNOSIS — L97411 Non-pressure chronic ulcer of right heel and midfoot limited to breakdown of skin: Secondary | ICD-10-CM

## 2021-11-13 DIAGNOSIS — L84 Corns and callosities: Secondary | ICD-10-CM

## 2021-11-13 NOTE — Progress Notes (Signed)
Subjective:  Patient ID: [name], female    DOB: 1946-08-14,   MRN: [id number]  Chief Complaint  Patient presents with   Wound Check    F/U Rt wound check -pt states," looks about the same." - per pt no changes - w/ lgiht redness, w/ swelling but no draiange Tx: iodosorb daily and sx shoe -pt denies N/V/F/Ch      75 y.o. female presents for follow-up of a right foot wound.  She relates she is doing well and has been applying iodosorb to the wound and a bandaid. Denies any pain. . She has been in a surgical shoe .[name] Does relates there are times when she is not in the surgical shoe. Presents today in regular shoes.  Cultures were taken and were negative as were X-rays. Denies any other pedal complaints. Denies n/v/f/c.   Past Medical History:  Diagnosis Date   Arthritis    Basal ganglia stroke ([place])    Bilateral lower extremity edema    Cancer ([place])    Carotid artery occlusion    Flank pain    Full dentures    History of cervical cancer    11/ 1994  Stage IB  s/p  high dose radiation brachytherapy @ [facility] 01/ 1995--- per pt no recurrence   History of chronic bronchitis    History of hyperthyroidism    d 03/ 2011---due to grave's disease--- s/p  RAI treatment 04/ 2011   History of kidney stones    History of sepsis 05/03/2018   due to UTI with klebsiella/ pyelonephritis/ ureteral obstruction cause by stone   Hyperlipidemia    Hypothyroidism, postradioiodine therapy    endocrinologist--  dr Loanne Drilling--  dx graves disease and s/p RAI i131 treatement 4/ 2011   Nauseated    Oral thrush 05/03/2018   Pneumonia    Type 2 diabetes mellitus treated with insulin ([name])    FOLLOWED BY PCP   Urgency of urination    Wears glasses     Objective:  Physical Exam: Vascular: DP/PT pulses 2/4 bilateral. CFT <3 seconds. Normal hair growth on digits. No edema.  Skin. No lacerations or abrasions bilateral feet. Right hallux wound with granular base and surrounding hyperkeratosis measuring  about 0.3 cm x 0.2 cm x 0.1 cm. No erythema edema or purulence noted.  Hyperkeratotic lesion noted to sub first metatarsal right and plantar left hallux. Pre-ulcerative.  Musculoskeletal: MMT 5/5 bilateral lower extremities in DF, PF, Inversion and Eversion. Deceased ROM in DF of ankle joint.  Neurological: Sensation intact to light touch.   Assessment:   1. Diabetic ulcer of right midfoot associated with diabetes mellitus due to underlying condition, limited to breakdown of skin ([place])   2. Pre-ulcerative calluses   3. Diabetic peripheral neuropathy associated with type 2 diabetes mellitus ([name])       Plan:  Patient was evaluated and treated and all questions answered. Ulcer right plantar first metatarsal with fat layer exposed.  -Debridement as below. -Pre-ulcerative calluses debrided as well. Advised to keep padding around these areas.  -Dressed with iodosorb, DSD. -Off-loading with surgical shoe. -No abx indicated.  -Discussed glucose control and proper protein-rich diet.  -Discussed if any worsening redness, pain, fever or chills to call or may need to report to the emergency room. Patient expressed understanding.   Procedure: Excisional Debridement of Wound Rationale: Removal of non-viable soft tissue from the wound to promote healing.  Anesthesia: none Pre-Debridement Wound Measurements: Overlying hyperkeratosis  Post-Debridement Wound Measurements: 0.3 cm  x 0.2 cm x 0.1 cm  Type of Debridement: Sharp Excisional Tissue Removed: Non-viable soft tissue Depth of Debridement: subcutaneous tissue. Technique: Sharp excisional debridement to bleeding, viable wound base.  Dressing: Dry, sterile, compression dressing. Disposition: Patient tolerated procedure well. Patient to return in 2 week for follow-up.  Return in about 2 weeks (around 11/27/2021) for wound check.   [name], DPM

## 2021-11-14 DIAGNOSIS — Z933 Colostomy status: Secondary | ICD-10-CM | POA: Diagnosis not present

## 2021-11-14 DIAGNOSIS — C20 Malignant neoplasm of rectum: Secondary | ICD-10-CM | POA: Diagnosis not present

## 2021-11-20 ENCOUNTER — Telehealth: Payer: Self-pay

## 2021-11-20 NOTE — Telephone Encounter (Signed)
Prior authorization has been started [place]. This is a 1 time refill that needs to continue to be refilled by Oncology

## 2021-11-26 ENCOUNTER — Inpatient Hospital Stay: Payer: Medicare HMO | Attending: Nurse Practitioner | Admitting: Oncology

## 2021-11-26 ENCOUNTER — Inpatient Hospital Stay: Payer: Medicare HMO

## 2021-11-26 ENCOUNTER — Encounter: Payer: Self-pay | Admitting: *Deleted

## 2021-11-26 ENCOUNTER — Other Ambulatory Visit: Payer: Self-pay

## 2021-11-26 VITALS — BP 141/58 | HR 80 | Temp 97.8°F | Resp 20 | Ht 60.0 in | Wt 151.0 lb

## 2021-11-26 DIAGNOSIS — C20 Malignant neoplasm of rectum: Secondary | ICD-10-CM

## 2021-11-26 DIAGNOSIS — U071 COVID-19: Secondary | ICD-10-CM | POA: Diagnosis not present

## 2021-11-26 DIAGNOSIS — J9601 Acute respiratory failure with hypoxia: Secondary | ICD-10-CM | POA: Diagnosis not present

## 2021-11-26 DIAGNOSIS — Z95828 Presence of other vascular implants and grafts: Secondary | ICD-10-CM

## 2021-11-26 LAB — BASIC METABOLIC PANEL - CANCER CENTER ONLY
Anion gap: 11 (ref 5–15)
BUN: 11 mg/dL (ref 8–23)
CO2: 25 mmol/L (ref 22–32)
Calcium: 7 mg/dL — ABNORMAL LOW (ref 8.9–10.3)
Chloride: 107 mmol/L (ref 98–111)
Creatinine: 0.71 mg/dL (ref 0.44–1.00)
GFR, Estimated: >60 mL/min (ref >60)
Glucose, Bld: 162 mg/dL — ABNORMAL HIGH (ref 70–99)
Potassium: 3.4 mmol/L — ABNORMAL LOW (ref 3.5–5.1)
Sodium: 143 mmol/L (ref 135–145)

## 2021-11-26 LAB — CBC WITH DIFFERENTIAL (CANCER CENTER ONLY)
Abs Immature Granulocytes: 0.02 10*3/uL (ref 0.00–0.07)
Basophils Absolute: 0 10*3/uL (ref 0.0–0.1)
Basophils Relative: 1 %
Eosinophils Absolute: 0.1 10*3/uL (ref 0.0–0.5)
Eosinophils Relative: 1 %
HCT: 31 % — ABNORMAL LOW (ref 36.0–46.0)
Hemoglobin: 9 g/dL — ABNORMAL LOW (ref 12.0–15.0)
Immature Granulocytes: 1 %
Lymphocytes Relative: 14 %
Lymphs Abs: 0.6 10*3/uL — ABNORMAL LOW (ref 0.7–4.0)
MCH: 31.3 pg (ref 26.0–34.0)
MCHC: 29 g/dL — ABNORMAL LOW (ref 30.0–36.0)
MCV: 107.6 fL — ABNORMAL HIGH (ref 80.0–100.0)
Monocytes Absolute: 0.4 10*3/uL (ref 0.1–1.0)
Monocytes Relative: 9 %
Neutro Abs: 3.1 10*3/uL (ref 1.7–7.7)
Neutrophils Relative %: 74 %
Platelet Count: 152 10*3/uL (ref 150–400)
RBC: 2.88 MIL/uL — ABNORMAL LOW (ref 3.87–5.11)
RDW: 16.1 % — ABNORMAL HIGH (ref 11.5–15.5)
WBC Count: 4.2 10*3/uL (ref 4.0–10.5)
nRBC: 0 % (ref 0.0–0.2)

## 2021-11-26 LAB — MAGNESIUM: Magnesium: 0.9 mg/dL — CL (ref 1.7–2.4)

## 2021-11-26 LAB — SAMPLE TO BLOOD BANK

## 2021-11-26 LAB — CEA (ACCESS): CEA (CHCC): 7.29 ng/mL — ABNORMAL HIGH (ref 0.00–5.00)

## 2021-11-26 MED ORDER — MAGNESIUM OXIDE -MG SUPPLEMENT 400 (240 MG) MG PO TABS
400.0000 mg | ORAL_TABLET | Freq: Two times a day (BID) | ORAL | 2 refills | Status: DC
Start: 2021-11-26 — End: 2022-02-18

## 2021-11-26 MED ORDER — SODIUM CHLORIDE 0.9 % IV SOLN: Freq: Once | INTRAVENOUS | Status: AC

## 2021-11-26 MED ORDER — HEPARIN SOD (PORK) LOCK FLUSH 100 UNIT/ML IV SOLN
500.0000 [IU] | Freq: Once | INTRAVENOUS | Status: AC
  Administered 2021-11-26: 500 [IU] via INTRAVENOUS

## 2021-11-26 MED ORDER — MAGNESIUM SULFATE 4 GM/100ML IV SOLN
4.0000 g | Freq: Once | INTRAVENOUS | Status: AC
  Administered 2021-11-26: 4 g via INTRAVENOUS
  Filled 2021-11-26: qty 100

## 2021-11-26 MED ORDER — SODIUM CHLORIDE 0.9% FLUSH
10.0000 mL | Freq: Once | INTRAVENOUS | Status: AC
  Administered 2021-11-26: 10 mL via INTRAVENOUS

## 2021-11-26 NOTE — Progress Notes (Signed)
[place] OFFICE PROGRESS NOTE   Diagnosis: Rectal cancer, cervical cancer  INTERVAL HISTORY:   [name] returns today as scheduled.  She is here with her son.  She has increased leg swelling.  She was placed on furosemide by her primary provider.  The leg swelling persists.  She is having stool per rectum.  She has urinary incontinence when standing.  Objective:  Vital signs in last 24 hours:  Blood pressure (!) 141/58, pulse 80, temperature 97.8 F (36.6 C), temperature source Oral, resp. rate 20, height 5' (1.524 m), weight 151 lb (68.5 kg), SpO2 100 %.     Resp: Lungs clear bilaterally Cardio: Regular rate and rhythm GI: No hepatomegaly, left lower quadrant colostomy Vascular: Edema above and below the knee bilaterally on the right greater than left Skin: Chronic stasis change at the lower leg bilaterally  Portacath/PICC-without erythema  Lab Results:  Lab Results  Component Value Date   WBC 4.2 11/26/2021   HGB 9.0 (L) 11/26/2021   HCT 31.0 (L) 11/26/2021   MCV 107.6 (H) 11/26/2021   PLT 152 11/26/2021   NEUTROABS 3.1 11/26/2021    CMP  Lab Results  Component Value Date   NA 143 11/26/2021   K 3.4 (L) 11/26/2021   CL 107 11/26/2021   CO2 25 11/26/2021   GLUCOSE 162 (H) 11/26/2021   BUN 11 11/26/2021   CREATININE 0.71 11/26/2021   CALCIUM 7.0 (L) 11/26/2021   PROT 5.7 (L) 11/07/2021   ALBUMIN 2.3 (L) 11/07/2021   AST 14 (L) 11/07/2021   ALT 9 11/07/2021   ALKPHOS 175 (H) 11/07/2021   BILITOT 0.7 11/07/2021   GFRNONAA >60 11/26/2021   GFRAA >60 03/29/2020    Medications: I have reviewed the patient's current medications.   Assessment/Plan:  Rectal cancer Partially obstructing mass beginning at 10 cm from the anal verge on colonoscopy 01/23/2021, biopsy-adenocarcinoma, immunohistochemical stains at [facility]-CK20, CDX-2, and SATB2 positive, patchy strong staining for p16.  The rectal tumor has a distinct immunohistochemical profile  suggesting a primary colorectal tumor as opposed to metastatic cervical cancer. CT abdomen/pelvis 01/18/2021-bilateral hydronephrosis, endometrial fluid collection, prominent stool in the colon MRI pelvis 01/23/2021-tumor at 7.6 cm from the anal verge, abnormal signal bridge in the cervix, mesorectum, and anterior aspect of the rectum-similar to MRI from 2020, MRI stage could not be defined secondary to posttreatment changes in the pelvis 03/18/2021-total abdominal hysterectomy, cystoscopy with insertion of ureteral stents, exploratory laparotomy, aborted low anterior resection, descending loop colostomy--right and left fallopian tubes and ovaries negative for malignancy; excision portion of cervix positive for adenocarcinoma; uterus with invasive moderately differentiated adenocarcinoma of the cervix, HPV associated, invading through full-thickness of the cervix/lower uterine segment and into parametrial tissue, margins of resection received disrupted, cannot be evaluated; remaining uterus with extensive endocervical adenocarcinoma in situ involving lower uterine segment and replacing endometrium.  This carcinoma was felt to be a separate primary from the rectal tumor. PET scan at [facility] 04/11/2021-intense FDG activity in region of known rectal mass.  Hypermetabolic left external iliac and right inguinal lymph nodes.  Minimal FDG uptake and small right supraclavicular lymph node.  No uptake seen in the cervix. 04/23/2021 FNA right inguinal lymph node-negative for malignancy Evaluated by Dr. Leamon Arnt 05/21/2021, 05/28/2021-appears to have 2 synchronous malignancies (cervical adenocarcinoma and rectal adenocarcinoma); further surgery which would entail pelvic exenteration not recommended by gynecologic oncology or colorectal surgery.  Full doses of radiotherapy not felt to be feasible.  Recommended 3 months of CAPOX  and then repeat MRI of abdomen/pelvis, chest CT and colonoscopy. Cycle 1 Xeloda 06/13/2021 Cycle 1 FOLFOX  07/08/2021, oxaliplatin dose reduced secondary to pre-existing neuropathy Cycle 2 FOLFOX 07/29/2021, Udenyca Cycle 3 FOLFOX 08/12/2021, Udenyca Cycle 4 FOLFOX 09/02/2021, Udenyca Cycle 5 FOLFOX 09/17/2021, Udenyca CTs at [facility] 10/01/2021-unchanged findings of rectal malignancy status posttreatment.  Marked thickening of the proximal duodenal wall.  Unchanged bilateral inguinal lymph nodes which are not enlarged but avid on prior PET.  Unchanged diffuse bladder wall thickening and perivesicular stranding which may represent radiation cystitis.  New mild right lower lobe bronchial wall thickening, mucus impaction and tree-in-bud pulmonary nodules. Cycle 6 FOLFOX 10/22/2021, Udenyca     Bilateral hydronephrosis-secondary to urinary retention? Hematometra found on pelvic MRI 01/23/2021, evaluated by gynecologic oncology Cervical cancer November 1994, stage Ib, treated with external beam radiation and brachytherapy at [facility] Diabetes Neuropathy Chronic pain secondary to #6 Recurrent urinary tract infections History of a CVA History of hyperthyroidism G3, P3 COVID-19 infection January 2021 Admission 08/25/2021 with a urinary tract infection-culture negative Anemia secondary to chronic disease, chemotherapy, and rectal bleeding-1 unit packed red blood cells 08/26/2021 Hospital admission 09/23/2021-symptomatic anemia 16.  Admission 10/08/2021 with altered mental status, nausea/vomiting, and left foot ulcers Urine culture 10/08/2021 positive for Pseudomonas aeruginosa EGD 10/12/2021-gastric and duodenal ulcers-not bleeding      Disposition: [name] has an improved performance status.  She has increased lower extremity edema.  The edema may be related to remote pelvic radiation or obstructing tumor in the pelvis.  I recommended she elevate her legs and try support stockings. She has hypomagnesemia today.  She will continue the potassium supplement and begin magnesium oxide.  She will receive intravenous  magnesium today.  [name] will return for an office visit in 3 weeks.  She is scheduled for restaging CTs at [facility] in April.  She would like to have the scans here.  She will not be a candidate for further chemotherapy unless her performance status continues to improve.  Betsy Coder, MD  11/26/2021  10:49 AM

## 2021-11-26 NOTE — Progress Notes (Signed)
CRITICAL VALUE STICKER  CRITICAL VALUE: Mg+ 0.9   RECEIVER (on-site recipient of call):Torsha Lemus,RN  DATE & TIME NOTIFIED: 11/26/21 @ 1043  MESSENGER (representative from lab): Otila Kluver MD NOTIFIED: Dr. Benay Spice  TIME OF NOTIFICATION:1048  RESPONSE: Transfuse IV Mg+ today and start oral Magnesium oxide 400 mg bid.

## 2021-11-26 NOTE — Patient Instructions (Signed)
Magnesium Sulfate Injection What is this medication? MAGNESIUM SULFATE (mag NEE zee um SUL fate) prevents and treats low levels of magnesium in your body. It may also be used to prevent and treat seizures during pregnancy in people with high blood pressure disorders, such as preeclampsia or eclampsia. Magnesium plays an important role in maintaining the health of your muscles and nervous system. This medicine may be used for other purposes; ask your health care provider or pharmacist if you have questions. What should I tell my care team before I take this medication? They need to know if you have any of these conditions: Heart disease History of irregular heart beat Kidney disease An unusual or allergic reaction to magnesium sulfate, medications, foods, dyes, or preservatives Pregnant or trying to get pregnant Breast-feeding How should I use this medication? This medication is for infusion into a vein. It is given in a hospital or clinic setting. Talk to your care team about the use of this medication in children. While this medication may be prescribed for selected conditions, precautions do apply. Overdosage: If you think you have taken too much of this medicine contact a poison control center or emergency room at once. NOTE: This medicine is only for you. Do not share this medicine with others. What if I miss a dose? This does not apply. What may interact with this medication? Certain medications for anxiety or sleep Certain medications for seizures like phenobarbital Digoxin Medications that relax muscles for surgery Narcotic medications for pain This list may not describe all possible interactions. Give your health care provider a list of all the medicines, herbs, non-prescription drugs, or dietary supplements you use. Also tell them if you smoke, drink alcohol, or use illegal drugs. Some items may interact with your medicine. What should I watch for while using this medication? Your  condition will be monitored carefully while you are receiving this medication. You may need blood work done while you are receiving this medication. What side effects may I notice from receiving this medication? Side effects that you should report to your care team as soon as possible: Allergic reactions--skin rash, itching, hives, swelling of the face, lips, tongue, or throat High magnesium level--confusion, drowsiness, facial flushing, redness, sweating, muscle weakness, fast or irregular heartbeat, trouble breathing Low blood pressure--dizziness, feeling faint or lightheaded, blurry vision Side effects that usually do not require medical attention (report to your care team if they continue or are bothersome): Headache Nausea This list may not describe all possible side effects. Call your doctor for medical advice about side effects. You may report side effects to FDA at 1-800-FDA-1088. Where should I keep my medication? This medication is given in a hospital or clinic and will not be stored at home. NOTE: This sheet is a summary. It may not cover all possible information. If you have questions about this medicine, talk to your doctor, pharmacist, or health care provider.  2022 Elsevier/Gold Standard (2021-01-17 00:00:00)  

## 2021-11-27 ENCOUNTER — Encounter: Payer: Self-pay | Admitting: Podiatry

## 2021-11-27 ENCOUNTER — Ambulatory Visit: Payer: Medicare HMO | Admitting: Podiatry

## 2021-11-27 DIAGNOSIS — E11621 Type 2 diabetes mellitus with foot ulcer: Secondary | ICD-10-CM | POA: Diagnosis not present

## 2021-11-27 DIAGNOSIS — L97519 Non-pressure chronic ulcer of other part of right foot with unspecified severity: Secondary | ICD-10-CM

## 2021-11-27 MED ORDER — DOXYCYCLINE HYCLATE 100 MG PO TABS: 100.0000 mg | ORAL_TABLET | Freq: Two times a day (BID) | ORAL | 0 refills | Status: DC

## 2021-11-27 NOTE — Progress Notes (Signed)
Subjective:  Patient ID: [name], female    DOB: 10/28/1946,   MRN: [id number]  Chief Complaint  Patient presents with   Wound Check    Pt is here to f/u right foot wound. Pt can not tell if area is doing better. Went to the hospital yesterday due to swelling on right foot radiating up her leg. Applied iodosorb on wound      76 y.o. female presents for follow-up of a right foot wound.  She relates she is doing well and has been applying iodosorb to the wound and a bandaid. Denies any pain. Marland Kitchen Has not been using surgical shoe. Relates swelling up the leg and primary concerned for infection in toe.  Cultures were taken and were negative as were X-rays. Denies any other pedal complaints. Denies n/v/f/c.   Past Medical History:  Diagnosis Date   Arthritis    Basal ganglia stroke ([place])    Bilateral lower extremity edema    Cancer ([place])    Carotid artery occlusion    Flank pain    Full dentures    History of cervical cancer    11/ 1994  Stage IB  s/p  high dose radiation brachytherapy @ [facility] 01/ 1995--- per pt no recurrence   History of chronic bronchitis    History of hyperthyroidism    d 03/ 2011---due to grave's disease--- s/p  RAI treatment 04/ 2011   History of kidney stones    History of sepsis 05/03/2018   due to UTI with klebsiella/ pyelonephritis/ ureteral obstruction cause by stone   Hyperlipidemia    Hypothyroidism, postradioiodine therapy    endocrinologist--  dr Loanne Drilling--  dx graves disease and s/p RAI i131 treatement 4/ 2011   Nauseated    Oral thrush 05/03/2018   Pneumonia    Type 2 diabetes mellitus treated with insulin ([place])    FOLLOWED BY PCP   Urgency of urination    Wears glasses     Objective:  Physical Exam: Vascular: DP/PT pulses 2/4 bilateral. CFT <3 seconds. Normal hair growth on digits. No edema.  Skin. No lacerations or abrasions bilateral feet. Right hallux wound with granular base and surrounding hyperkeratosis measuring about 0.1 cm x 0.1  cm x 0.1 cm. Nearly healed.  No erythema edema or purulence noted.  Hyperkeratotic lesion noted to sub first metatarsal right and plantar left hallux. Pre-ulcerative.  Edema noted to right lower extremity with mild erythema present on dorsal foot.  Musculoskeletal: MMT 5/5 bilateral lower extremities in DF, PF, Inversion and Eversion. Deceased ROM in DF of ankle joint.  Neurological: Sensation intact to light touch.   Assessment:   1. Diabetic ulcer of toe of right foot associated with type 2 diabetes mellitus, unspecified ulcer stage ([place])        Plan:  Patient was evaluated and treated and all questions answered. Ulcer right plantar first metatarsal limited to breakdown of skin, nearly healed.  -Debridement as below. -Pre-ulcerative calluses debrided as well. Advised to keep padding around these areas.  -Dressed with iodosorb, DSD. -Off-loading with surgical shoe. -Doxycycline provided for concern of possible cellulitis and swelling up the leg.  -Discussed glucose control and proper protein-rich diet.  -Discussed if any worsening redness, pain, fever or chills to call or may need to report to the emergency room. Patient expressed understanding.   Procedure: Excisional Debridement of Wound Rationale: Removal of non-viable soft tissue from the wound to promote healing.  Anesthesia: none Pre-Debridement Wound Measurements: Overlying hyperkeratosis  Post-Debridement Wound Measurements: 0.1 cm x 0.1 cm x 0.1 cm  Type of Debridement: Sharp Excisional Tissue Removed: Non-viable soft tissue Depth of Debridement: subcutaneous tissue. Technique: Sharp excisional debridement to bleeding, viable wound base.  Dressing: Dry, sterile, compression dressing. Disposition: Patient tolerated procedure well. Patient to return in 2 week for follow-up.  Return in about 3 weeks (around 12/18/2021) for wound check.   [name], DPM

## 2021-11-29 ENCOUNTER — Encounter (HOSPITAL_COMMUNITY): Payer: Self-pay | Admitting: Emergency Medicine

## 2021-11-29 ENCOUNTER — Inpatient Hospital Stay (HOSPITAL_COMMUNITY)
Admission: EM | Admit: 2021-11-29 | Discharge: 2021-12-02 | DRG: 177 | Disposition: A | Discharge status: Home Health | Payer: Medicare HMO | Attending: Internal Medicine | Admitting: Internal Medicine

## 2021-11-29 ENCOUNTER — Other Ambulatory Visit: Payer: Self-pay

## 2021-11-29 ENCOUNTER — Emergency Department (HOSPITAL_COMMUNITY): Payer: Medicare HMO

## 2021-11-29 DIAGNOSIS — C20 Malignant neoplasm of rectum: Secondary | ICD-10-CM | POA: Diagnosis present

## 2021-11-29 DIAGNOSIS — J069 Acute upper respiratory infection, unspecified: Secondary | ICD-10-CM | POA: Diagnosis present

## 2021-11-29 DIAGNOSIS — J189 Pneumonia, unspecified organism: Secondary | ICD-10-CM | POA: Insufficient documentation

## 2021-11-29 DIAGNOSIS — R824 Acetonuria: Secondary | ICD-10-CM | POA: Diagnosis present

## 2021-11-29 DIAGNOSIS — G9341 Metabolic encephalopathy: Secondary | ICD-10-CM | POA: Diagnosis present

## 2021-11-29 DIAGNOSIS — Z885 Allergy status to narcotic agent status: Secondary | ICD-10-CM

## 2021-11-29 DIAGNOSIS — E11649 Type 2 diabetes mellitus with hypoglycemia without coma: Secondary | ICD-10-CM | POA: Diagnosis present

## 2021-11-29 DIAGNOSIS — D539 Nutritional anemia, unspecified: Secondary | ICD-10-CM | POA: Diagnosis present

## 2021-11-29 DIAGNOSIS — E1151 Type 2 diabetes mellitus with diabetic peripheral angiopathy without gangrene: Secondary | ICD-10-CM | POA: Diagnosis present

## 2021-11-29 DIAGNOSIS — I739 Peripheral vascular disease, unspecified: Secondary | ICD-10-CM | POA: Diagnosis present

## 2021-11-29 DIAGNOSIS — I2510 Atherosclerotic heart disease of native coronary artery without angina pectoris: Secondary | ICD-10-CM | POA: Diagnosis present

## 2021-11-29 DIAGNOSIS — J1282 Pneumonia due to coronavirus disease 2019: Secondary | ICD-10-CM | POA: Diagnosis present

## 2021-11-29 DIAGNOSIS — Z833 Family history of diabetes mellitus: Secondary | ICD-10-CM | POA: Diagnosis not present

## 2021-11-29 DIAGNOSIS — Z6831 Body mass index (BMI) 31.0-31.9, adult: Secondary | ICD-10-CM | POA: Diagnosis not present

## 2021-11-29 DIAGNOSIS — E119 Type 2 diabetes mellitus without complications: Secondary | ICD-10-CM | POA: Diagnosis present

## 2021-11-29 DIAGNOSIS — E039 Hypothyroidism, unspecified: Secondary | ICD-10-CM | POA: Diagnosis present

## 2021-11-29 DIAGNOSIS — G629 Polyneuropathy, unspecified: Secondary | ICD-10-CM

## 2021-11-29 DIAGNOSIS — Z8541 Personal history of malignant neoplasm of cervix uteri: Secondary | ICD-10-CM | POA: Diagnosis not present

## 2021-11-29 DIAGNOSIS — Z794 Long term (current) use of insulin: Secondary | ICD-10-CM

## 2021-11-29 DIAGNOSIS — M1990 Unspecified osteoarthritis, unspecified site: Secondary | ICD-10-CM | POA: Diagnosis present

## 2021-11-29 DIAGNOSIS — E1165 Type 2 diabetes mellitus with hyperglycemia: Secondary | ICD-10-CM | POA: Diagnosis present

## 2021-11-29 DIAGNOSIS — Z7984 Long term (current) use of oral hypoglycemic drugs: Secondary | ICD-10-CM

## 2021-11-29 DIAGNOSIS — E43 Unspecified severe protein-calorie malnutrition: Secondary | ICD-10-CM | POA: Diagnosis present

## 2021-11-29 DIAGNOSIS — E890 Postprocedural hypothyroidism: Secondary | ICD-10-CM | POA: Diagnosis present

## 2021-11-29 DIAGNOSIS — E1169 Type 2 diabetes mellitus with other specified complication: Secondary | ICD-10-CM | POA: Diagnosis present

## 2021-11-29 DIAGNOSIS — D61818 Other pancytopenia: Secondary | ICD-10-CM | POA: Diagnosis present

## 2021-11-29 DIAGNOSIS — G9340 Encephalopathy, unspecified: Secondary | ICD-10-CM | POA: Diagnosis present

## 2021-11-29 DIAGNOSIS — D638 Anemia in other chronic diseases classified elsewhere: Secondary | ICD-10-CM | POA: Diagnosis present

## 2021-11-29 DIAGNOSIS — Z8673 Personal history of transient ischemic attack (TIA), and cerebral infarction without residual deficits: Secondary | ICD-10-CM | POA: Diagnosis not present

## 2021-11-29 DIAGNOSIS — J9601 Acute respiratory failure with hypoxia: Secondary | ICD-10-CM | POA: Diagnosis present

## 2021-11-29 DIAGNOSIS — Z825 Family history of asthma and other chronic lower respiratory diseases: Secondary | ICD-10-CM

## 2021-11-29 DIAGNOSIS — Z808 Family history of malignant neoplasm of other organs or systems: Secondary | ICD-10-CM

## 2021-11-29 DIAGNOSIS — U071 COVID-19: Principal | ICD-10-CM | POA: Diagnosis present

## 2021-11-29 DIAGNOSIS — E669 Obesity, unspecified: Secondary | ICD-10-CM | POA: Diagnosis present

## 2021-11-29 DIAGNOSIS — I10 Essential (primary) hypertension: Secondary | ICD-10-CM | POA: Diagnosis present

## 2021-11-29 DIAGNOSIS — Z7902 Long term (current) use of antithrombotics/antiplatelets: Secondary | ICD-10-CM

## 2021-11-29 DIAGNOSIS — A419 Sepsis, unspecified organism: Secondary | ICD-10-CM

## 2021-11-29 DIAGNOSIS — Z79899 Other long term (current) drug therapy: Secondary | ICD-10-CM

## 2021-11-29 DIAGNOSIS — D649 Anemia, unspecified: Secondary | ICD-10-CM | POA: Diagnosis present

## 2021-11-29 DIAGNOSIS — Z8619 Personal history of other infectious and parasitic diseases: Secondary | ICD-10-CM

## 2021-11-29 DIAGNOSIS — R823 Hemoglobinuria: Secondary | ICD-10-CM | POA: Diagnosis present

## 2021-11-29 DIAGNOSIS — E785 Hyperlipidemia, unspecified: Secondary | ICD-10-CM | POA: Diagnosis present

## 2021-11-29 DIAGNOSIS — Z96642 Presence of left artificial hip joint: Secondary | ICD-10-CM | POA: Diagnosis present

## 2021-11-29 DIAGNOSIS — Z87891 Personal history of nicotine dependence: Secondary | ICD-10-CM

## 2021-11-29 DIAGNOSIS — Z7982 Long term (current) use of aspirin: Secondary | ICD-10-CM

## 2021-11-29 DIAGNOSIS — Z79890 Hormone replacement therapy (postmenopausal): Secondary | ICD-10-CM

## 2021-11-29 LAB — CBC WITH DIFFERENTIAL/PLATELET
Abs Immature Granulocytes: 0.01 10*3/uL (ref 0.00–0.07)
Basophils Absolute: 0 10*3/uL (ref 0.0–0.1)
Basophils Relative: 0 %
Eosinophils Absolute: 0 10*3/uL (ref 0.0–0.5)
Eosinophils Relative: 0 %
HCT: 28.3 % — ABNORMAL LOW (ref 36.0–46.0)
Hemoglobin: 8.5 g/dL — ABNORMAL LOW (ref 12.0–15.0)
Immature Granulocytes: 0 %
Lymphocytes Relative: 7 %
Lymphs Abs: 0.3 10*3/uL — ABNORMAL LOW (ref 0.7–4.0)
MCH: 32.2 pg (ref 26.0–34.0)
MCHC: 30 g/dL (ref 30.0–36.0)
MCV: 107.2 fL — ABNORMAL HIGH (ref 80.0–100.0)
Monocytes Absolute: 0.3 10*3/uL (ref 0.1–1.0)
Monocytes Relative: 7 %
Neutro Abs: 3.3 10*3/uL (ref 1.7–7.7)
Neutrophils Relative %: 86 %
Platelets: 143 10*3/uL — ABNORMAL LOW (ref 150–400)
RBC: 2.64 MIL/uL — ABNORMAL LOW (ref 3.87–5.11)
RDW: 15.4 % (ref 11.5–15.5)
WBC: 3.9 10*3/uL — ABNORMAL LOW (ref 4.0–10.5)
nRBC: 0 % (ref 0.0–0.2)

## 2021-11-29 LAB — PROCALCITONIN: Procalcitonin: 0.4 ng/mL

## 2021-11-29 LAB — COMPREHENSIVE METABOLIC PANEL
ALT: 9 U/L (ref 0–44)
AST: 20 U/L (ref 15–41)
Albumin: 2.1 g/dL — ABNORMAL LOW (ref 3.5–5.0)
Alkaline Phosphatase: 93 U/L (ref 38–126)
Anion gap: 7 (ref 5–15)
BUN: 10 mg/dL (ref 8–23)
CO2: 23 mmol/L (ref 22–32)
Calcium: 7.8 mg/dL — ABNORMAL LOW (ref 8.9–10.3)
Chloride: 104 mmol/L (ref 98–111)
Creatinine, Ser: 0.55 mg/dL (ref 0.44–1.00)
GFR, Estimated: >60 mL/min (ref >60)
Glucose, Bld: 99 mg/dL (ref 70–99)
Potassium: 3.6 mmol/L (ref 3.5–5.1)
Sodium: 134 mmol/L — ABNORMAL LOW (ref 135–145)
Total Bilirubin: 0.5 mg/dL (ref 0.3–1.2)
Total Protein: 5.1 g/dL — ABNORMAL LOW (ref 6.5–8.1)

## 2021-11-29 LAB — CBG MONITORING, ED
Glucose-Capillary: 94 mg/dL (ref 70–99)
Glucose-Capillary: 131 mg/dL — ABNORMAL HIGH (ref 70–99)

## 2021-11-29 LAB — RESP PANEL BY RT-PCR (FLU A&B, COVID) ARPGX2
Influenza A by PCR: NEGATIVE
Influenza B by PCR: NEGATIVE
SARS Coronavirus 2 by RT PCR: POSITIVE — AB

## 2021-11-29 LAB — URINALYSIS, ROUTINE W REFLEX MICROSCOPIC
Bacteria, UA: NONE SEEN
Bilirubin Urine: NEGATIVE
Glucose, UA: NEGATIVE mg/dL
Ketones, ur: 5 mg/dL — AB
Leukocytes,Ua: NEGATIVE
Nitrite: NEGATIVE
Protein, ur: NEGATIVE mg/dL
Specific Gravity, Urine: 1.009 (ref 1.005–1.030)
pH: 8 (ref 5.0–8.0)

## 2021-11-29 LAB — TROPONIN I (HIGH SENSITIVITY)
Troponin I (High Sensitivity): 16 ng/L (ref <18)
Troponin I (High Sensitivity): 20 ng/L — ABNORMAL HIGH (ref <18)

## 2021-11-29 LAB — D-DIMER, QUANTITATIVE: D-Dimer, Quant: 3.17 ug/mL-FEU — ABNORMAL HIGH (ref 0.00–0.50)

## 2021-11-29 LAB — MRSA NEXT GEN BY PCR, NASAL: MRSA by PCR Next Gen: NOT DETECTED

## 2021-11-29 LAB — PROTIME-INR
INR: 1.3 — ABNORMAL HIGH (ref 0.8–1.2)
Prothrombin Time: 15.9 seconds — ABNORMAL HIGH (ref 11.4–15.2)

## 2021-11-29 LAB — LACTIC ACID, PLASMA: Lactic Acid, Venous: 0.7 mmol/L (ref 0.5–1.9)

## 2021-11-29 LAB — C-REACTIVE PROTEIN: CRP: 8.6 mg/dL — ABNORMAL HIGH (ref <1.0)

## 2021-11-29 LAB — STREP PNEUMONIAE URINARY ANTIGEN: Strep Pneumo Urinary Antigen: NEGATIVE

## 2021-11-29 LAB — BRAIN NATRIURETIC PEPTIDE: B Natriuretic Peptide: 154.1 pg/mL — ABNORMAL HIGH (ref 0.0–100.0)

## 2021-11-29 LAB — MAGNESIUM: Magnesium: 1.5 mg/dL — ABNORMAL LOW (ref 1.7–2.4)

## 2021-11-29 LAB — FERRITIN: Ferritin: 318 ng/mL — ABNORMAL HIGH (ref 11–307)

## 2021-11-29 LAB — APTT: aPTT: 48 seconds — ABNORMAL HIGH (ref 24–36)

## 2021-11-29 MED ORDER — LACTATED RINGERS IV BOLUS (SEPSIS)
1000.0000 mL | Freq: Once | INTRAVENOUS | Status: AC
  Administered 2021-11-29: 1000 mL via INTRAVENOUS

## 2021-11-29 MED ORDER — FUROSEMIDE 20 MG PO TABS
20.0000 mg | ORAL_TABLET | Freq: Every day | ORAL | Status: DC
  Administered 2021-11-30 – 2021-12-02 (×3): 20 mg via ORAL
  Filled 2021-11-29 (×3): qty 1

## 2021-11-29 MED ORDER — ACETAMINOPHEN 500 MG PO TABS
1000.0000 mg | ORAL_TABLET | Freq: Once | ORAL | Status: AC
Start: 2021-11-29 — End: 2021-11-29
  Administered 2021-11-29: 1000 mg via ORAL
  Filled 2021-11-29: qty 2

## 2021-11-29 MED ORDER — GLUCERNA SHAKE PO LIQD
237.0000 mL | Freq: Three times a day (TID) | ORAL | Status: DC
  Administered 2021-11-30 – 2021-12-02 (×7): 237 mL via ORAL
  Filled 2021-11-29 (×11): qty 237

## 2021-11-29 MED ORDER — LINAGLIPTIN 5 MG PO TABS
5.0000 mg | ORAL_TABLET | Freq: Every day | ORAL | Status: DC
  Administered 2021-11-30 – 2021-12-01 (×2): 5 mg via ORAL
  Filled 2021-11-29 (×3): qty 1

## 2021-11-29 MED ORDER — ZINC SULFATE 220 (50 ZN) MG PO CAPS
220.0000 mg | ORAL_CAPSULE | Freq: Every day | ORAL | Status: DC
  Administered 2021-11-29 – 2021-12-02 (×4): 220 mg via ORAL
  Filled 2021-11-29 (×4): qty 1

## 2021-11-29 MED ORDER — FERROUS SULFATE 325 (65 FE) MG PO TABS
325.0000 mg | ORAL_TABLET | Freq: Two times a day (BID) | ORAL | Status: DC
  Administered 2021-11-30 – 2021-12-02 (×4): 325 mg via ORAL
  Filled 2021-11-29 (×4): qty 1

## 2021-11-29 MED ORDER — BACLOFEN 10 MG PO TABS: 10.0000 mg | ORAL_TABLET | Freq: Four times a day (QID) | ORAL | Status: DC | PRN

## 2021-11-29 MED ORDER — GEMFIBROZIL 600 MG PO TABS
600.0000 mg | ORAL_TABLET | Freq: Two times a day (BID) | ORAL | Status: DC
  Administered 2021-11-29 – 2021-12-02 (×6): 600 mg via ORAL
  Filled 2021-11-29 (×7): qty 1

## 2021-11-29 MED ORDER — MAGNESIUM OXIDE -MG SUPPLEMENT 400 (240 MG) MG PO TABS
400.0000 mg | ORAL_TABLET | Freq: Two times a day (BID) | ORAL | Status: DC
  Administered 2021-11-29 – 2021-12-02 (×6): 400 mg via ORAL
  Filled 2021-11-29 (×6): qty 1

## 2021-11-29 MED ORDER — GABAPENTIN 300 MG PO CAPS
300.0000 mg | ORAL_CAPSULE | Freq: Every day | ORAL | Status: DC
  Administered 2021-11-30 – 2021-12-02 (×3): 300 mg via ORAL
  Filled 2021-11-29 (×3): qty 1

## 2021-11-29 MED ORDER — DOCUSATE SODIUM 100 MG PO CAPS
100.0000 mg | ORAL_CAPSULE | Freq: Two times a day (BID) | ORAL | Status: DC
Start: 2021-11-29 — End: 2021-12-02
  Administered 2021-11-29 – 2021-12-02 (×5): 100 mg via ORAL
  Filled 2021-11-29 (×6): qty 1

## 2021-11-29 MED ORDER — CLOPIDOGREL BISULFATE 75 MG PO TABS
75.0000 mg | ORAL_TABLET | Freq: Every day | ORAL | Status: DC
  Administered 2021-11-29 – 2021-12-01 (×3): 75 mg via ORAL
  Filled 2021-11-29 (×4): qty 1

## 2021-11-29 MED ORDER — POTASSIUM CHLORIDE CRYS ER 20 MEQ PO TBCR
20.0000 meq | EXTENDED_RELEASE_TABLET | Freq: Two times a day (BID) | ORAL | Status: DC
  Administered 2021-11-29 – 2021-12-02 (×6): 20 meq via ORAL
  Filled 2021-11-29 (×6): qty 1

## 2021-11-29 MED ORDER — SODIUM CHLORIDE 0.9 % IV SOLN: INTRAVENOUS | Status: DC

## 2021-11-29 MED ORDER — PANTOPRAZOLE SODIUM 40 MG PO TBEC
40.0000 mg | DELAYED_RELEASE_TABLET | Freq: Two times a day (BID) | ORAL | Status: DC
Start: 2021-11-29 — End: 2021-12-02
  Administered 2021-11-29 – 2021-12-02 (×6): 40 mg via ORAL
  Filled 2021-11-29 (×6): qty 1

## 2021-11-29 MED ORDER — POLYETHYLENE GLYCOL 3350 17 G PO PACK
17.0000 g | PACK | Freq: Every day | ORAL | Status: DC
  Administered 2021-12-02: 17 g via ORAL
  Filled 2021-11-29 (×3): qty 1

## 2021-11-29 MED ORDER — OXYCODONE HCL ER 10 MG PO T12A
10.0000 mg | EXTENDED_RELEASE_TABLET | Freq: Two times a day (BID) | ORAL | Status: DC
  Administered 2021-11-29 – 2021-12-02 (×6): 10 mg via ORAL
  Filled 2021-11-29 (×6): qty 1

## 2021-11-29 MED ORDER — MAGNESIUM SULFATE 4 GM/100ML IV SOLN
4.0000 g | Freq: Once | INTRAVENOUS | Status: AC
  Administered 2021-11-29: 4 g via INTRAVENOUS
  Filled 2021-11-29: qty 100

## 2021-11-29 MED ORDER — LEVOTHYROXINE SODIUM 125 MCG PO TABS
125.0000 ug | ORAL_TABLET | Freq: Every day | ORAL | Status: DC
  Administered 2021-11-30 – 2021-12-02 (×3): 125 ug via ORAL
  Filled 2021-11-29 (×3): qty 1

## 2021-11-29 MED ORDER — PROCHLORPERAZINE MALEATE 10 MG PO TABS: 10.0000 mg | ORAL_TABLET | Freq: Four times a day (QID) | ORAL | Status: DC | PRN

## 2021-11-29 MED ORDER — INSULIN ASPART 100 UNIT/ML IJ SOLN
0.0000 [IU] | INTRAMUSCULAR | Status: DC
  Administered 2021-11-30 (×2): 4 [IU] via SUBCUTANEOUS
  Administered 2021-12-01: 3 [IU] via SUBCUTANEOUS
  Administered 2021-12-01: 7 [IU] via SUBCUTANEOUS
  Administered 2021-12-01: 3 [IU] via SUBCUTANEOUS
  Administered 2021-12-02 (×2): 4 [IU] via SUBCUTANEOUS
  Filled 2021-11-29: qty 0.2

## 2021-11-29 MED ORDER — SODIUM CHLORIDE 0.9 % IV SOLN
2.0000 g | Freq: Once | INTRAVENOUS | Status: AC
  Administered 2021-11-29: 2 g via INTRAVENOUS
  Filled 2021-11-29: qty 2

## 2021-11-29 MED ORDER — DEXAMETHASONE SODIUM PHOSPHATE 10 MG/ML IJ SOLN
6.0000 mg | INTRAMUSCULAR | Status: DC
  Administered 2021-11-29 – 2021-12-02 (×4): 6 mg via INTRAVENOUS
  Filled 2021-11-29 (×4): qty 1

## 2021-11-29 MED ORDER — VANCOMYCIN HCL 1500 MG/300ML IV SOLN
1500.0000 mg | Freq: Once | INTRAVENOUS | Status: AC
  Administered 2021-11-29: 1500 mg via INTRAVENOUS
  Filled 2021-11-29: qty 300

## 2021-11-29 MED ORDER — METFORMIN HCL 500 MG PO TABS: 500.0000 mg | ORAL_TABLET | Freq: Two times a day (BID) | ORAL | Status: DC

## 2021-11-29 MED ORDER — ASPIRIN EC 81 MG PO TBEC
81.0000 mg | DELAYED_RELEASE_TABLET | Freq: Every morning | ORAL | Status: DC
  Administered 2021-11-30 – 2021-12-02 (×3): 81 mg via ORAL
  Filled 2021-11-29 (×3): qty 1

## 2021-11-29 MED ORDER — SODIUM CHLORIDE 0.9 % IV SOLN
200.0000 mg | Freq: Once | INTRAVENOUS | Status: AC
  Administered 2021-11-29: 200 mg via INTRAVENOUS
  Filled 2021-11-29: qty 40

## 2021-11-29 MED ORDER — SODIUM CHLORIDE 0.9 % IV SOLN
2.0000 g | Freq: Two times a day (BID) | INTRAVENOUS | Status: DC
  Administered 2021-11-29 – 2021-11-30 (×3): 2 g via INTRAVENOUS
  Filled 2021-11-29 (×4): qty 2

## 2021-11-29 MED ORDER — SERTRALINE HCL 25 MG PO TABS
25.0000 mg | ORAL_TABLET | Freq: Every day | ORAL | Status: DC
  Administered 2021-11-29 – 2021-12-02 (×4): 25 mg via ORAL
  Filled 2021-11-29 (×4): qty 1

## 2021-11-29 MED ORDER — GABAPENTIN 300 MG PO CAPS
600.0000 mg | ORAL_CAPSULE | Freq: Every day | ORAL | Status: DC
  Administered 2021-11-29 – 2021-12-01 (×3): 600 mg via ORAL
  Filled 2021-11-29 (×3): qty 2

## 2021-11-29 MED ORDER — ASCORBIC ACID 500 MG PO TABS
500.0000 mg | ORAL_TABLET | Freq: Every day | ORAL | Status: DC
  Administered 2021-11-29 – 2021-12-02 (×4): 500 mg via ORAL
  Filled 2021-11-29 (×4): qty 1

## 2021-11-29 MED ORDER — GABAPENTIN 300 MG PO CAPS: 300.0000 mg | ORAL_CAPSULE | ORAL | Status: DC

## 2021-11-29 MED ORDER — ATORVASTATIN CALCIUM 40 MG PO TABS
40.0000 mg | ORAL_TABLET | Freq: Every morning | ORAL | Status: DC
  Administered 2021-11-30 – 2021-12-02 (×3): 40 mg via ORAL
  Filled 2021-11-29 (×3): qty 1

## 2021-11-29 MED ORDER — VANCOMYCIN HCL 750 MG/150ML IV SOLN: 750.0000 mg | INTRAVENOUS | Status: DC

## 2021-11-29 MED ORDER — SODIUM CHLORIDE 0.9 % IV SOLN
100.0000 mg | Freq: Every day | INTRAVENOUS | Status: DC
  Administered 2021-11-30 – 2021-12-02 (×3): 100 mg via INTRAVENOUS
  Filled 2021-11-29 (×3): qty 20

## 2021-11-29 MED ORDER — INSULIN DETEMIR 100 UNIT/ML ~~LOC~~ SOLN
0.1500 [IU]/kg | Freq: Two times a day (BID) | SUBCUTANEOUS | Status: DC
  Administered 2021-11-29 – 2021-11-30 (×2): 11 [IU] via SUBCUTANEOUS
  Filled 2021-11-29 (×7): qty 0.11

## 2021-11-29 NOTE — H&P (Signed)
History and Physical    [name]:[id number] DOB: November 29, 1945 DOA: 11/29/2021  PCP: Everardo Beals, NP   Patient coming from: Home.  I have personally briefly reviewed patient's old medical records in [place]  Chief Complaint: Altered mental status.  HPI: [name] is a 76 y.o. female with medical history significant of osteoarthritis, basal ganglia stroke, bilateral lower extremity edema, rectal cancer, carotid artery occlusion, CAD, history of cervical cancer, chronic bronchitis, history of hypothyroidism due to Graves' disease treated with RAI in April 2011, hypothyroidism, nephrolithiasis, history of sepsis secondary to UTI, hyperlipidemia, history of pneumonia, type II DM, urinary urgency who was brought to the emergency department via EMS due to altered mental status.  Her mental status is a lot better now.  She is currently oriented to person and place.  She knows she is at [facility].  However she still disoriented to time, date and situation.  She answers simple questions.  She denied headache, back, chest or abdominal pain at this time.  ED Course: Initial vital signs were temperature 102.8 F, pulse 83, respiration 14, BP 142/47 mmHg O2 sats 100% on nasal cannula oxygen at 3 LPM.  She received cefepime and vancomycin in the emergency department.  LR 1000 mL bolus.  Lab work: Urinalysis showed small hemoglobinuria and ketonuria 5 mg/dL.  Coronavirus PCR was positive.  CBC is her white count 3.9, hemoglobin 8.5 g/dL and platelets 143.  PT was 15.9, INR 1.3 and PTT 48.  Her initial troponin and lactic acid were normal.  Second troponin was 29 ng/L.  Magnesium was 1.5 mg/dL.  BNP 154.1 pg/mL.  CMP showed a sodium 134 minimal/L, total protein of 5.1 and albumin of 2.1 g/dL.  The rest of the values were unremarkable when calcium level was corrected to albumin.  Imaging: 2 view chest radiograph concerning for developing bilateral bronchopneumonia.  Please  see images and full radiology report for further details.  Review of Systems: As per HPI otherwise all other systems reviewed and are negative.  Past Medical History:  Diagnosis Date   Arthritis    Basal ganglia stroke ([place])    Bilateral lower extremity edema    Cancer (HCC)    Carotid artery occlusion    Flank pain    Full dentures    History of cervical cancer    11/ 1994  Stage IB  s/p  high dose radiation brachytherapy @ [facility] 01/ 1995--- per pt no recurrence   History of chronic bronchitis    History of hyperthyroidism    d 03/ 2011---due to grave's disease--- s/p  RAI treatment 04/ 2011   History of kidney stones    History of sepsis 05/03/2018   due to UTI with klebsiella/ pyelonephritis/ ureteral obstruction cause by stone   Hyperlipidemia    Hypothyroidism, postradioiodine therapy    endocrinologist--  dr Loanne Drilling--  dx graves disease and s/p RAI i131 treatement 4/ 2011   Nauseated    Oral thrush 05/03/2018   Pneumonia    Type 2 diabetes mellitus treated with insulin ([place])    FOLLOWED BY PCP   Urgency of urination    Wears glasses     Past Surgical History:  Procedure Laterality Date   APPENDECTOMY     BIOPSY  01/23/2021   Procedure: BIOPSY;  Surgeon: Mauri Pole, MD;  Location: [facility];  Service: Endoscopy;;   BIOPSY  10/12/2021   Procedure: BIOPSY;  Surgeon: Thornton Park, MD;  Location: [facility];  Service: Gastroenterology;;   CAROTID ENDARTERECTOMY Right 03/28/2020   CATARACT EXTRACTION W/ INTRAOCULAR LENS  IMPLANT, BILATERAL  2004  approx.   COLONOSCOPY WITH PROPOFOL N/A 01/23/2021   Procedure: COLONOSCOPY WITH PROPOFOL;  Surgeon: Mauri Pole, MD;  Location: [facility];  Service: Endoscopy;  Laterality: N/A;   CYSTOSCOPY WITH STENT PLACEMENT Right 05/04/2018   Procedure: CYSTOSCOPY WITH STENT PLACEMENT AND RETROGRADE PYELOGRAM;  Surgeon: Ceasar Mons, MD;  Location: [facility];  Service: Urology;  Laterality: Right;    CYSTOSCOPY/URETEROSCOPY/HOLMIUM LASER/STENT PLACEMENT Right 05/19/2018   Procedure: CYSTOSCOPY, URETEROSCOPY/HOLMIUM LASER, STONE BASKETRY/ STENT EXCHANGE;  Surgeon: Ceasar Mons, MD;  Location: [facility];  Service: Urology;  Laterality: Right;   ENDARTERECTOMY Right 03/28/2020   Procedure: RIGHT CAROTID ENDARTERECTOMY with PATCH ANGIOPLASTY;  Surgeon: Rosetta Posner, MD;  Location: [facility] OR;  Service: Vascular;  Laterality: Right;   ESOPHAGOGASTRODUODENOSCOPY (EGD) WITH PROPOFOL N/A 10/12/2021   Procedure: ESOPHAGOGASTRODUODENOSCOPY (EGD) WITH PROPOFOL;  Surgeon: Thornton Park, MD;  Location: [facility];  Service: Gastroenterology;  Laterality: N/A;   EXCISION MASS LEFT CHEST WALL  09-20-2007   dr Grandville Silos  [facility]   Radioactive Iodine Therapy     for thyroid   REVISION TOTAL HIP ARTHROPLASTY Left early 2000s   SUBMUCOSAL TATTOO INJECTION  01/23/2021   Procedure: SUBMUCOSAL TATTOO INJECTION;  Surgeon: Mauri Pole, MD;  Location: [facility];  Service: Endoscopy;;   TANDEM RING INSERTION  1995   dr Aldean Ast @ [facility]   EUA w/ tandem placement in ovid  (for direct high dose radiation brachytherapy , cervical cancer)   TOTAL HIP ARTHROPLASTY Left 1990s   TRANSTHORACIC ECHOCARDIOGRAM  08/07/2017   mild focal basal hypertrophy of the septum,  ef 31-54%, grade 1 diastolic dysfunction/  atrial septum with lipomatous hypertrophy/  trivial PR   Social History  reports that she quit smoking about 39 years ago. Her smoking use included cigarettes. She has never used smokeless tobacco. She reports that she does not drink alcohol and does not use drugs.  Allergies  Allergen Reactions   Codeine Nausea Only   Family History  Problem Relation Age of Onset   Emphysema Father    Diabetes Father    Emphysema Sister    Emphysema Brother    Cancer Mother        Skin Cancer   Diabetes Mother    Colon cancer Neg Hx    Esophageal cancer Neg Hx    Rectal cancer Neg Hx     Stomach cancer Neg Hx    Prior to Admission medications   Medication Sig Start Date End Date Taking? Authorizing Provider  aspirin 81 MG EC tablet Take 1 tablet (81 mg total) by mouth daily. RESUME AFTER 1 WEEK Patient taking differently: Take 81 mg by mouth every morning. 09/03/21   Bonnielee Haff, MD  atorvastatin (LIPITOR) 40 MG tablet Take 40 mg by mouth every morning. 12/15/19   [provider]  baclofen (LIORESAL) 10 MG tablet Take 10 mg by mouth 4 (four) times daily as needed for muscle spasms. 03/22/21   [provider]  cephALEXin (KEFLEX) 250 MG capsule Take 1 capsule (250 mg total) by mouth daily. RESUME AFTER YOU HAVE COMPLETED THE COURSE OF CIPRO Patient taking differently: Take 250 mg by mouth every morning. 08/28/21   Bonnielee Haff, MD  ciprofloxacin (CIPRO) 500 MG tablet Take 1 tablet (500 mg total) by mouth every 12 (twelve) hours. 11/07/21   Truddie Hidden, MD  clopidogrel (PLAVIX) 75 MG tablet Take 1 tablet (75 mg total) by mouth at bedtime. 10/12/21   Shawna Clamp, MD  docusate sodium (COLACE) 100 MG capsule Take 100 mg by mouth 2 (two) times daily as needed for mild constipation.    [provider]  doxycycline (VIBRA-TABS) 100 MG tablet Take 1 tablet (100 mg total) by mouth 2 (two) times daily for 14 days. 11/27/21 12/11/21  [name], MD  ferrous sulfate 325 (65 FE) MG tablet Take 1 tablet (325 mg total) by mouth 2 (two) times daily with a meal. 08/28/21 11/26/21  Bonnielee Haff, MD  fluconazole (DIFLUCAN) 100 MG tablet Take 1 tablet (100 mg total) by mouth daily. Take 2 tabs on first day, then 1 tab for 9 more days. 10/16/21   Thornton Park, MD  furosemide (LASIX) 20 MG tablet Take 20 mg by mouth daily. 11/19/21   [provider]  gabapentin (NEURONTIN) 300 MG capsule Take 300-600 mg by mouth See admin instructions. Take one capsule (300 mg) by mouth every morning and two capsules (600 mg) at night    [provider]   gemfibrozil (LOPID) 600 MG tablet Take 600 mg by mouth 2 (two) times daily.    [provider]  insulin degludec (TRESIBA FLEXTOUCH) 100 UNIT/ML FlexTouch Pen Inject 10 Units into the skin daily. Patient taking differently: Inject 20 Units into the skin daily as needed (CBG >200). 08/28/21   Bonnielee Haff, MD  Lancets [facility] DELICA PLUS [id number]) MISC Apply topically 2 (two) times daily. 12/02/20   [provider]  levothyroxine (SYNTHROID) 125 MCG tablet Take 1 tablet (125 mcg total) by mouth daily at 6 (six) AM. 08/29/21 11/27/21  Bonnielee Haff, MD  magnesium oxide (MAG-OX) 400 (240 Mg) MG tablet Take 1 tablet (400 mg total) by mouth 2 (two) times daily. 11/26/21   Ladell Pier, MD  metFORMIN (GLUCOPHAGE) 500 MG tablet Take 500 mg by mouth 2 (two) times daily. 02/01/21   [provider]  nystatin (MYCOSTATIN/NYSTOP) powder Apply 1 application topically 2 (two) times daily. To groin rash 07/29/21   Ladell Pier, MD  nystatin cream (MYCOSTATIN) Apply 1 application topically 2 (two) times daily as needed (groin rash). Mix with triamcinolone cream    [provider]  ondansetron (ZOFRAN) 8 MG tablet Take 1 tablet (8 mg total) by mouth every 6 (six) hours as needed for nausea or vomiting. 10/31/21   Esterwood, Amy S, PA-C  ONETOUCH VERIO test strip 1 each 2 (two) times daily. 11/29/20   [provider]  pantoprazole (PROTONIX) 40 MG tablet Take 1 tablet (40 mg total) by mouth 2 (two) times daily. 10/31/21   Esterwood, Amy S, PA-C  polyethylene glycol powder (GLYCOLAX/MIRALAX) 17 GM/SCOOP powder Take 17 g by mouth 2 (two) times daily as needed (constipation).    [provider]  potassium chloride SA (KLOR-CON) 20 MEQ tablet Take 1 tablet (20 mEq total) by mouth 2 (two) times daily. 10/03/21   Ladell Pier, MD  prochlorperazine (COMPAZINE) 10 MG tablet Take 10 mg by mouth every 6 (six) hours as needed for nausea or vomiting.    [provider]  triamcinolone cream (KENALOG) 0.1 % Apply 1 application topically 2 (two) times daily as needed (groin rash). Mix with nystatin cream 09/19/20   [provider]  XTAMPZA ER 9 MG C12A Take 1 capsule by mouth 2 (two) times daily. 10/31/21   Esterwood, Glade Nurse, PA-C   Physical Exam: Vitals:  11/29/21 0830 11/29/21 0853 11/29/21 1000 11/29/21 1100  BP: (!) 131/47  (!) 147/49 (!) 138/46  Pulse: 82  80 80  Resp: 17  19 17   Temp:    98 F (36.7 C)  TempSrc:    Oral  SpO2: 99%  99% 100%  Weight:  72.6 kg    Height:  5' (1.524 m)     Constitutional: Chronically ill-appearing.  NAD, calm, comfortable Eyes: PERRL, lids and conjunctivae normal.  Bilateral conjunctival injection. ENMT: Mucous membranes and lips are dry. Posterior pharynx clear of any exudate or lesions. Neck: normal, supple, no masses, no thyromegaly Respiratory: Decreased breath sounds in bases, otherwise clear to auscultation bilaterally, no wheezing, no crackles. Normal respiratory effort. No accessory muscle use.  Cardiovascular: Regular rate and rhythm, no murmurs / rubs / gallops. No extremity edema. 2+ pedal pulses. No carotid bruits.  Abdomen: Obese, no distention.  Soft, no tenderness, no masses palpated. No hepatosplenomegaly. Bowel sounds positive.  Musculoskeletal: no clubbing / cyanosis.  Severe generalized weakness. Good ROM, no contractures. Normal muscle tone.  Skin: no acute rashes, lesions, ulcers on very limited dermatological examination. Neurologic: CN 2-12 grossly intact. Sensation intact, DTR normal. Strength 5/5 in all 4.  Psychiatric:  Alert and oriented x 2.  Improving from earlier.  Labs on Admission: I have personally reviewed following labs and imaging studies  CBC: Recent Labs  Lab 11/26/21 1004 11/29/21 0815  WBC 4.2 3.9*  NEUTROABS 3.1 3.3  HGB 9.0* 8.5*  HCT 31.0* 28.3*  MCV 107.6* 107.2*  PLT 152 588*   Basic Metabolic Panel: Recent Labs  Lab 11/26/21 1004  11/29/21 0815  NA 143 134*  K 3.4* 3.6  CL 107 104  CO2 25 23  GLUCOSE 162* 99  BUN 11 10  CREATININE 0.71 0.55  CALCIUM 7.0* 7.8*  MG 0.9* 1.5*   GFR: Estimated Creatinine Clearance: 54 mL/min (by C-G formula based on SCr of 0.55 mg/dL).  Liver Function Tests: Recent Labs  Lab 11/29/21 0815  AST 20  ALT 9  ALKPHOS 93  BILITOT 0.5  PROT 5.1*  ALBUMIN 2.1*   Urine analysis:    Component Value Date/Time   COLORURINE STRAW (A) 11/29/2021 0920   APPEARANCEUR CLEAR 11/29/2021 0920   LABSPEC 1.009 11/29/2021 0920   PHURINE 8.0 11/29/2021 0920   GLUCOSEU NEGATIVE 11/29/2021 0920   HGBUR SMALL (A) 11/29/2021 0920   BILIRUBINUR NEGATIVE 11/29/2021 0920   KETONESUR 5 (A) 11/29/2021 0920   PROTEINUR NEGATIVE 11/29/2021 0920        NITRITE NEGATIVE 11/29/2021 0920   LEUKOCYTESUR NEGATIVE 11/29/2021 0920   Radiological Exams on Admission: DG Chest 2 View  Result Date: 11/29/2021 CLINICAL DATA:  76 year old female with history of shortness of breath and decreased oxygen saturation. EXAM: CHEST - 2 VIEW COMPARISON:  Chest x-ray 10/08/2021. FINDINGS: Right internal jugular single-lumen porta cath with tip terminating at the superior cavoatrial junction. Lung volumes are normal. Diffuse peribronchial cuffing and scattered areas of interstitial prominence are noted throughout the lungs bilaterally. No consolidative airspace disease. No pleural effusions. No pneumothorax. No pulmonary nodule or mass noted. Pulmonary vasculature and the cardiomediastinal silhouette are within normal limits. Atherosclerosis in the thoracic aorta. IMPRESSION: 1. The appearance the chest is concerning for severe bronchitis, likely with developing multilobar bilateral bronchopneumonia. 2. Aortic atherosclerosis. Electronically Signed   By: Vinnie Langton M.D.   On: 11/29/2021 09:12    10/01/2020 echocardiogram IMPRESSIONS:   1. Left ventricular ejection fraction, by estimation, is  60 to 65%. The  left  ventricle has normal function. The left ventricle has no regional  wall motion abnormalities. Left ventricular diastolic parameters are  consistent with Grade I diastolic  dysfunction (impaired relaxation).   2. Right ventricular systolic function is normal. The right ventricular  size is normal.   3. The mitral valve is normal in structure. No evidence of mitral valve  regurgitation. No evidence of mitral stenosis.   4. The aortic valve is tricuspid. Aortic valve regurgitation is not  visualized. Mild aortic valve sclerosis is present, with no evidence of  aortic valve stenosis.   5. The inferior vena cava is normal in size with greater than 50%  respiratory variability, suggesting right atrial pressure of 3 mmHg.   EKG: Independently reviewed.  Vent. rate 84 BPM PR interval 119 ms QRS duration 97 ms QT/QTcB 370/438 ms P-R-T axes 11 88 -9 Sinus rhythm Borderline short PR interval Borderline right axis deviation Low voltage, extremity and precordial leads  Assessment/Plan Principal Problem:   Acute respiratory failure with hypoxia (HCC) Due to    Acute respiratory disease due to COVID-19 virus   Sepsis due to pneumonia (HCC) Fever, leukopenia and known source of infection. Admit to PCU/inpatient. Supplemental oxygen. Flutter valve incentive spirometry. Check inflammatory markers. Check procalcitonin level. Continue cefepime and vancomycin. Bronchodilators as needed. Follow-up CBC, CMP, procalcitonin and inflammatory markers.  Active Problems:   Acute toxic encephalopathy Secondary to above. Resolving with treatment.    Hypomagnesemia Replacement ordered. Continue oral magnesium.    Hypothyroidism Levothyroxine 125 mcg p.o. daily.    Type 2 diabetes mellitus (HCC) Carbohydrate modified diet. Begin COVID-19 on steroid treatment aggressive glycemic control.    Coronary artery disease Continue statin and aspirin.    Hyperlipidemia associated with type 2  diabetes mellitus (HCC) Continue atorvastatin and gemfibrozil.    Essential hypertension Hold furosemide till tomorrow. Monitor blood pressure.    Normocytic anemia Monitor hematocrit and hemoglobin. Transfuse as needed.    Peripheral nerve disease Continue gabapentin.    Rectal cancer [facility]) Follow-up with oncology as an outpatient.    Severe protein-calorie malnutrition (HCC) Protein supplementation. :.  Nutritional services evaluation.     DVT prophylaxis: SCDs. Code Status:   Full code. Family Communication:   Disposition Plan:   Patient is from:  Home.  Anticipated DC to:  Home.  Anticipated DC date:  48 to 72 hours.  Anticipated DC barriers: Clinical condition.  Consults called:   Admission status:  Inpatient/progressive care unit.  Severity of Illness: High severity in the setting of acute respiratory failure due to bronchopneumonia.  Reubin Milan MD [facility]  How to contact the [facility] Attending or Consulting provider [place] or covering provider during after hours [place], for this patient?   Check the care team in [facility] and look for a) attending/consulting [facility] provider listed and b) the [facility] team listed Log into [email or web address] and use [place]'s universal password to access. If you do not have the password, please contact the hospital operator. Locate the [facility] provider you are looking for under [facility] and page to a number that you can be directly reached. If you still have difficulty reaching the provider, please page the [facility] (Director on Call) for the Hospitalists listed on amion for assistance.  11/29/2021, 11:52 AM   This document was prepared with Dragon voice recognition software and may contain some unintended transcription errors.

## 2021-11-29 NOTE — Progress Notes (Signed)
A consult was received from an ED physician for vancomycin & cefepime per pharmacy dosing.  The patient's profile has been reviewed for ht/wt/allergies/indication/available labs.    A one time order has been placed for vancomycin 1500 mg & cefepime 2 gm.   Further antibiotics/pharmacy consults should be ordered by admitting physician if indicated.                       Thank you,  Eudelia Bunch, Pharm.D 11/29/2021 10:14 AM

## 2021-11-29 NOTE — Progress Notes (Signed)
PT demonstrated hands on understanding of Flutter device. Non productive cough at this time. 

## 2021-11-29 NOTE — Progress Notes (Signed)
Pharmacy Antibiotic Note  [name] is a 76 y.o. female admitted on 11/29/2021 with 1/13 CXR: concerning for severe bronchitis, likely with developing multilobar bilateral bronchopneumonia.  Pharmacy has been consulted for vancomycin & Cefepime dosing.  Plan: Cefepime 2 gm IV q12h Vancomycin 1500 mg IV x 1 followed by vancomycin 750 mg IV q24 for est AUC 508.6 using SCr rounded to 0.8, Vd 0.5 for BMI > 30 F/u MRSA PCR F/u renal function, WBC, temp, culture data Vancomycin levels as needed  Height: 5' (152.4 cm) Weight: 72.6 kg (160 lb) IBW/kg (Calculated) : 45.5  Temp (24hrs), Avg:102.8 F (39.3 C), Min:102.8 F (39.3 C), Max:102.8 F (39.3 C)  Recent Labs  Lab 11/26/21 1004 11/29/21 0815  WBC 4.2 3.9*  CREATININE 0.71 0.55  LATICACIDVEN  --  0.7    Estimated Creatinine Clearance: 54 mL/min (by C-G formula based on SCr of 0.55 mg/dL).    Allergies  Allergen Reactions   Codeine Nausea Only  Antimicrobials this admission:  1/13 vanc>> 1/13 cefepime >> Dose adjustments this admission:   Microbiology results:  1/13 UCx 1/13 BCx2 PTA: 11/07/21 UCx: > 100 K enterobacter cloacae R ance & zosyn. Intermed NTF; sens cefepime. Cipro. Norva Karvonen imi  Thank you for allowing pharmacy to be a part of this patients care.  Eudelia Bunch, Pharm.D 11/29/2021 11:31 AM

## 2021-11-29 NOTE — ED Provider Notes (Signed)
[facility] DEPT Provider Note   CSN: [id number] Arrival date & time: 11/29/21  0758     History  CC: Fever, confusion   ANNAI HEICK is a 76 y.o. female with a history of rectal cancer, cervical cancer status post hysterectomy, status post ostomy, on chemotherapy, presenting from home with concern for fever, leg and arm swelling, confusion.  The patient is a poor historian, per her family members appears to be more confused.  At baseline the patient is quite lucid, does not have a history of dementia according to her daughter.  [name], daughter, reports patient has "lots of fluid" in her legs, R>L, and arms swelling and eyes swelling.  Lungs rattling.  She may have a UTI, because she seems confused.  Her daughter reports that the patient's oncologist has her on chronic antibiotics for recurrent UTIs, which she has been taking, as well as a Lasix water pill which she has been taking for edema, but these do not seem to be helping.  Patient was last seen in the office by her oncology Dr Betsy Coder on 11/26/20, who noted prior urine cultures positive as well for Pseudomonas, history of recurring urine infections, history of urinary retention with hydronephrosis, anemia of chronic disease with last blood transfusion October 2022.  Patient was admitted for sepsis in November of last year, although blood cultures were no growth at that time, urine culture showed Pseudomonas which was pansensitive according to culture results from November 22,  Last positive Urine cx Dec 2022: Specimen Description URINE, CLEAN CATCH  Performed at [facility], [place] [address] [place], [place] [address]   Special Requests NONE  Performed at [facility], [place] [address], [place], [place] [address]   Culture >=100,000 COLONIES/mL ENTEROBACTER CLOACAE Abnormal    Report Status 11/09/2021 FINAL   Organism ID, Bacteria ENTEROBACTER  CLOACAE Abnormal    Resulting Agency [facility]     Susceptibility   Enterobacter cloacae    MIC    CEFAZOLIN >=64 RESIST... Resistant    CEFEPIME <=0.12 SENS... Sensitive    CIPROFLOXACIN <=0.25 SENS... Sensitive    GENTAMICIN <=1 SENSITIVE  Sensitive    IMIPENEM 0.5 SENSITIVE  Sensitive    NITROFURANTOIN 64 INTERMED... Intermediate    PIP/TAZO >=128 RESIS... Resistant    TRIMETH/SULFA <=20 SENSIT... Sensitive           HPI     Home Medications Prior to Admission medications   Medication Sig Start Date End Date Taking? Authorizing Provider  aspirin 81 MG EC tablet Take 1 tablet (81 mg total) by mouth daily. RESUME AFTER 1 WEEK Patient taking differently: Take 81 mg by mouth every morning. 09/03/21  Yes Bonnielee Haff, MD  atorvastatin (LIPITOR) 40 MG tablet Take 40 mg by mouth every morning. 12/15/19  Yes [provider]  baclofen (LIORESAL) 10 MG tablet Take 10 mg by mouth 4 (four) times daily as needed for muscle spasms. 03/22/21  Yes [provider]  cephALEXin (KEFLEX) 250 MG capsule Take 1 capsule (250 mg total) by mouth daily. RESUME AFTER YOU HAVE COMPLETED THE COURSE OF CIPRO Patient taking differently: Take 250 mg by mouth every morning. 08/28/21  Yes Bonnielee Haff, MD  ciprofloxacin (CIPRO) 500 MG tablet Take 1 tablet (500 mg total) by mouth every 12 (twelve) hours. Patient taking differently: Take 500 mg by mouth in the morning, at noon, in the evening, and at bedtime. For 10 days 11/07/21  Yes  Truddie Hidden, MD  clopidogrel (PLAVIX) 75 MG tablet Take 1 tablet (75 mg total) by mouth at bedtime. 10/12/21  Yes Shawna Clamp, MD  docusate sodium (COLACE) 100 MG capsule Take 100 mg by mouth 2 (two) times daily.   Yes [provider]  ferrous sulfate 325 (65 FE) MG tablet Take 1 tablet (325 mg total) by mouth 2 (two) times daily with a meal. 08/28/21 01/25/22 Yes Bonnielee Haff, MD  furosemide (LASIX) 20 MG tablet Take 20 mg by mouth  daily. 11/19/21  Yes [provider]  gabapentin (NEURONTIN) 300 MG capsule Take 300-600 mg by mouth See admin instructions. Take one capsule (300 mg) by mouth every morning and two capsules (600 mg) at night   Yes [provider]  gemfibrozil (LOPID) 600 MG tablet Take 600 mg by mouth 2 (two) times daily.   Yes [provider]  insulin degludec (TRESIBA FLEXTOUCH) 100 UNIT/ML FlexTouch Pen Inject 10 Units into the skin daily. Patient taking differently: Inject 20 Units into the skin daily as needed (CBG >200). 08/28/21  Yes Bonnielee Haff, MD  levothyroxine (SYNTHROID) 125 MCG tablet Take 1 tablet (125 mcg total) by mouth daily at 6 (six) AM. 08/29/21 01/25/22 Yes Bonnielee Haff, MD  magnesium oxide (MAG-OX) 400 (240 Mg) MG tablet Take 1 tablet (400 mg total) by mouth 2 (two) times daily. 11/26/21  Yes Ladell Pier, MD  metFORMIN (GLUCOPHAGE) 500 MG tablet Take 500 mg by mouth 2 (two) times daily. 02/01/21  Yes [provider]  nystatin (MYCOSTATIN/NYSTOP) powder Apply 1 application topically 2 (two) times daily. To groin rash 07/29/21  Yes Ladell Pier, MD  nystatin cream (MYCOSTATIN) Apply 1 application topically 2 (two) times daily as needed (groin rash). Mix with triamcinolone cream   Yes [provider]  ondansetron (ZOFRAN) 8 MG tablet Take 1 tablet (8 mg total) by mouth every 6 (six) hours as needed for nausea or vomiting. 10/31/21  Yes Esterwood, Amy S, PA-C  pantoprazole (PROTONIX) 40 MG tablet Take 1 tablet (40 mg total) by mouth 2 (two) times daily. 10/31/21  Yes Esterwood, Amy S, PA-C  polyethylene glycol powder (GLYCOLAX/MIRALAX) 17 GM/SCOOP powder Take 17 g by mouth daily.   Yes [provider]  potassium chloride SA (KLOR-CON) 20 MEQ tablet Take 1 tablet (20 mEq total) by mouth 2 (two) times daily. 10/03/21  Yes Ladell Pier, MD  prochlorperazine (COMPAZINE) 10 MG tablet Take 10 mg by mouth every 6 (six) hours as needed for  nausea or vomiting.   Yes [provider]  sertraline (ZOLOFT) 25 MG tablet Take 25 mg by mouth daily. 11/20/21  Yes [provider]  triamcinolone cream (KENALOG) 0.1 % Apply 1 application topically 2 (two) times daily as needed (groin rash). Mix with nystatin cream 09/19/20  Yes [provider]  XTAMPZA ER 9 MG C12A Take 1 capsule by mouth 2 (two) times daily. 10/31/21  Yes Esterwood, Amy S, PA-C  doxycycline (VIBRA-TABS) 100 MG tablet Take 1 tablet (100 mg total) by mouth 2 (two) times daily for 14 days. Patient not taking: Reported on 11/29/2021 11/27/21 12/11/21  [name], MD  fluconazole (DIFLUCAN) 100 MG tablet Take 1 tablet (100 mg total) by mouth daily. Take 2 tabs on first day, then 1 tab for 9 more days. Patient not taking: Reported on 11/29/2021 10/16/21   Thornton Park, MD      Allergies    Codeine    Review of Systems  Review of Systems  Physical Exam Updated Vital Signs BP (!) 138/46    Pulse 80    Temp 98 F (36.7 C) (Oral)    Resp 17    Ht 5' (1.524 m)    Wt 72.6 kg    SpO2 100%    BMI 31.25 kg/m  Physical Exam Constitutional:      General: She is not in acute distress. HENT:     Head: Normocephalic and atraumatic.  Eyes:     Conjunctiva/sclera: Conjunctivae normal.     Pupils: Pupils are equal, round, and reactive to light.  Cardiovascular:     Rate and Rhythm: Normal rate and regular rhythm.     Pulses: Normal pulses.  Pulmonary:     Effort: Pulmonary effort is normal.     Comments: Rhonchi on lung exams, satting 93% on room air, speaking full sentences, no respiratory distress Musculoskeletal:     Comments: Symmetrical lower extremity edema, edema more so of the right arm than the left arm, some periorbital edema.  Skin:    General: Skin is warm and dry.  Neurological:     General: No focal deficit present.     Mental Status: She is alert. Mental status is at baseline.  Psychiatric:        Mood and Affect: Mood normal.         Behavior: Behavior normal.    ED Results / Procedures / Treatments   Labs (all labs ordered are listed, but only abnormal results are displayed) Labs Reviewed  RESP PANEL BY RT-PCR (FLU A&B, COVID) ARPGX2 - Abnormal; Notable for the following components:      Result Value   SARS Coronavirus 2 by RT PCR POSITIVE (*)    All other components within normal limits  URINALYSIS, ROUTINE W REFLEX MICROSCOPIC - Abnormal; Notable for the following components:   Color, Urine STRAW (*)    Hgb urine dipstick SMALL (*)    Ketones, ur 5 (*)    All other components within normal limits  COMPREHENSIVE METABOLIC PANEL - Abnormal; Notable for the following components:   Sodium 134 (*)    Calcium 7.8 (*)    Total Protein 5.1 (*)    Albumin 2.1 (*)    All other components within normal limits  CBC WITH DIFFERENTIAL/PLATELET - Abnormal; Notable for the following components:   WBC 3.9 (*)    RBC 2.64 (*)    Hemoglobin 8.5 (*)    HCT 28.3 (*)    MCV 107.2 (*)    Platelets 143 (*)    Lymphs Abs 0.3 (*)    All other components within normal limits  PROTIME-INR - Abnormal; Notable for the following components:   Prothrombin Time 15.9 (*)    INR 1.3 (*)    All other components within normal limits  APTT - Abnormal; Notable for the following components:   aPTT 48 (*)    All other components within normal limits  BRAIN NATRIURETIC PEPTIDE - Abnormal; Notable for the following components:   B Natriuretic Peptide 154.1 (*)    All other components within normal limits  MAGNESIUM - Abnormal; Notable for the following components:   Magnesium 1.5 (*)    All other components within normal limits  TROPONIN I (HIGH SENSITIVITY) - Abnormal; Notable for the following components:   Troponin I (High Sensitivity) 20 (*)    All other components within normal limits  CULTURE, BLOOD (ROUTINE X 2)  CULTURE, BLOOD (ROUTINE X 2)  URINE CULTURE  EXPECTORATED SPUTUM ASSESSMENT W GRAM STAIN, RFLX TO RESP C  MRSA  NEXT GEN BY PCR, NASAL  LACTIC ACID, PLASMA  STREP PNEUMONIAE URINARY ANTIGEN  TROPONIN I (HIGH SENSITIVITY)    EKG EKG Interpretation  Date/Time:  Friday November 29 2021 08:14:51 EST Ventricular Rate:  84 PR Interval:  119 QRS Duration: 97 QT Interval:  370 QTC Calculation: 438 R Axis:   88 Text Interpretation: Sinus rhythm Borderline short PR interval Borderline right axis deviation Low voltage, extremity and precordial leads Confirmed by Octaviano Glow [phone]) on 11/29/2021 8:38:14 AM  Radiology DG Chest 2 View  Result Date: 11/29/2021 CLINICAL DATA:  76 year old female with history of shortness of breath and decreased oxygen saturation. EXAM: CHEST - 2 VIEW COMPARISON:  Chest x-ray 10/08/2021. FINDINGS: Right internal jugular single-lumen porta cath with tip terminating at the superior cavoatrial junction. Lung volumes are normal. Diffuse peribronchial cuffing and scattered areas of interstitial prominence are noted throughout the lungs bilaterally. No consolidative airspace disease. No pleural effusions. No pneumothorax. No pulmonary nodule or mass noted. Pulmonary vasculature and the cardiomediastinal silhouette are within normal limits. Atherosclerosis in the thoracic aorta. IMPRESSION: 1. The appearance the chest is concerning for severe bronchitis, likely with developing multilobar bilateral bronchopneumonia. 2. Aortic atherosclerosis. Electronically Signed   By: Vinnie Langton M.D.   On: 11/29/2021 09:12    Procedures .Critical Care Performed by: Wyvonnia Dusky, MD Authorized by: Wyvonnia Dusky, MD   Critical care provider statement:    Critical care time (minutes):  45   Critical care time was exclusive of:  Separately billable procedures and treating other patients   Critical care was necessary to treat or prevent imminent or life-threatening deterioration of the following conditions:  Sepsis   Critical care was time spent personally by me on the following  activities:  Ordering and performing treatments and interventions, ordering and review of laboratory studies, ordering and review of radiographic studies, pulse oximetry, review of old charts, examination of patient and evaluation of patient's response to treatment   Care discussed with: admitting provider      Medications Ordered in ED Medications  0.9 %  sodium chloride infusion ( Intravenous New Bag/Given 11/29/21 1015)  vancomycin (VANCOREADY) IVPB 1500 mg/300 mL (1,500 mg Intravenous New Bag/Given 11/29/21 1129)  magnesium sulfate IVPB 4 g 100 mL (4 g Intravenous New Bag/Given 11/29/21 1209)  lactated ringers bolus 1,000 mL (1,000 mLs Intravenous New Bag/Given 11/29/21 1210)  ceFEPIme (MAXIPIME) 2 g in sodium chloride 0.9 % 100 mL IVPB (has no administration in time range)  vancomycin (VANCOREADY) IVPB 750 mg/150 mL (has no administration in time range)  ceFEPIme (MAXIPIME) 2 g in sodium chloride 0.9 % 100 mL IVPB (0 g Intravenous Stopped 11/29/21 1045)  acetaminophen (TYLENOL) tablet 1,000 mg (1,000 mg Oral Given 11/29/21 1104)    ED Course/ Medical Decision Making/ A&P Clinical Course as of 11/29/21 1233  Fri Nov 29, 2021  0947 X-rays consistent with bronchopneumonia.  Lactate is within normal limits.  She does not show signs of severe sepsis or septic shock.  I would be judicious with fluids given that she does have evidence of significant edema of the extremities.  I have ordered IV vancomycin and cefepime for cross coverage of pulmonary and possible urinary source of infection.  We can start her on continuous infusion. [MT]  4259 Leukocytes,Ua: NEGATIVE [MT]  0959 Nitrite: NEGATIVE [MT]  1117 Signed out to hospitalist Dr Olevia Bowens, transition of care discussed [  MT]    Clinical Course User Index [MT] Michaell Grider, Carola Rhine, MD                           Medical Decision Making  This patient presents to the ED with concern for edema, confusion.  This involves an extensive number of treatment  options, and is a complaint that carries with it a high risk of complications and morbidity.  The differential diagnosis includes sepsis versus congestive heart failure versus other infection  Co-morbidities that complicate the patient evaluation: History of colon cancer and chemotherapy  Additional history obtained from patient's daughter by phone  External records from outside source obtained and reviewed including recent urine culture results, hospital discharge  I ordered and personally interpreted labs.  The pertinent results include:  Covid (+) - trop at baseline, mild leukopeni, mg 1.5  I ordered imaging studies including x-ray of the chest I independently visualized and interpreted imaging which showed bilateral pneumonia with her presentation I agree with the radiologist interpretation  The patient was maintained on a cardiac monitor.  I personally viewed and interpreted the cardiac monitored which showed an underlying rhythm of: Sinus rhythm  Per my interpretation the patient's ECG shows normal sinus rhythm with no acute ischemic findings  I ordered medication including IV antibiotics for suspected multifocal pneumonia I have reviewed the patients home medicines and have made adjustments as needed  Test Considered: -Clinically had a lower suspicion for intra-abdominal source of infection or confusion or fever, and clinically doubted pulmonary embolism.  Critical Interventions:IV antibiotics for sepsis  After the interventions noted above, I reevaluated the patient and found that they have: stayed the same   Dispostion:  After consideration of the diagnostic results and the patients response to treatment, I feel that the patent would benefit from inpatient hospitalization admission for continued monitoring of confusion, continue treatment of presumptive bacterial pneumonia.  It is possible that her symptoms are related to acute COVID infection.  However with her history of  chemotherapy, her leukopenia, her immunocompromise status, I do think sepsis rule out with empiric antibiotics would be reasonable.  Clinical Course as of 11/29/21 1233  Fri Nov 29, 2021  3825 X-rays consistent with bronchopneumonia.  Lactate is within normal limits.  She does not show signs of severe sepsis or septic shock.  I would be judicious with fluids given that she does have evidence of significant edema of the extremities.  I have ordered IV vancomycin and cefepime for cross coverage of pulmonary and possible urinary source of infection.  We can start her on continuous infusion. [MT]  0539 Leukocytes,Ua: NEGATIVE [MT]  0959 Nitrite: NEGATIVE [MT]  1117 Signed out to hospitalist Dr Olevia Bowens, transition of care discussed [MT]    Clinical Course User Index [MT] Noor Vidales, Carola Rhine, MD   [name] was evaluated in Emergency Department on 11/29/2021 for the symptoms described in the history of present illness. She was evaluated in the context of the global COVID-19 pandemic, which necessitated consideration that the patient might be at risk for infection with the SARS-CoV-2 virus that causes COVID-19. Institutional protocols and algorithms that pertain to the evaluation of patients at risk for COVID-19 are in a state of rapid change based on information released by regulatory bodies including the CDC and federal and state organizations. These policies and algorithms were followed during the patient's care in the ED.        Final Clinical Impression(s) /  ED Diagnoses Final diagnoses:  Sepsis, due to unspecified organism, unspecified whether acute organ dysfunction present [facility])  COVID-19    Rx / DC Orders ED Discharge Orders     None         Naveed Humphres, Carola Rhine, MD 11/29/21 1233

## 2021-11-29 NOTE — ED Triage Notes (Signed)
Pt BIBA from home. Per EMS pt went to doctor yesterday for right leg swelling and this am family noticed right arm swelling. Pt also c/o of SOB with sating 88% on room air and AMS. Family suspects UTI due to significant hx.  A&O x1.   BP 140/64 HR 88 RR 18 97% 3LNC CBG 109

## 2021-11-30 DIAGNOSIS — E669 Obesity, unspecified: Secondary | ICD-10-CM | POA: Diagnosis present

## 2021-11-30 DIAGNOSIS — I739 Peripheral vascular disease, unspecified: Secondary | ICD-10-CM | POA: Diagnosis present

## 2021-11-30 LAB — GLUCOSE, CAPILLARY
Glucose-Capillary: 157 mg/dL — ABNORMAL HIGH (ref 70–99)
Glucose-Capillary: 187 mg/dL — ABNORMAL HIGH (ref 70–99)

## 2021-11-30 LAB — URINE CULTURE: Culture: NO GROWTH

## 2021-11-30 LAB — COMPREHENSIVE METABOLIC PANEL
ALT: 9 U/L (ref 0–44)
AST: 16 U/L (ref 15–41)
Albumin: 1.9 g/dL — ABNORMAL LOW (ref 3.5–5.0)
Alkaline Phosphatase: 80 U/L (ref 38–126)
Anion gap: 8 (ref 5–15)
BUN: 17 mg/dL (ref 8–23)
CO2: 23 mmol/L (ref 22–32)
Calcium: 8.2 mg/dL — ABNORMAL LOW (ref 8.9–10.3)
Chloride: 107 mmol/L (ref 98–111)
Creatinine, Ser: 0.46 mg/dL (ref 0.44–1.00)
GFR, Estimated: >60 mL/min (ref >60)
Glucose, Bld: 64 mg/dL — ABNORMAL LOW (ref 70–99)
Potassium: 3.6 mmol/L (ref 3.5–5.1)
Sodium: 138 mmol/L (ref 135–145)
Total Bilirubin: 0.6 mg/dL (ref 0.3–1.2)
Total Protein: 5 g/dL — ABNORMAL LOW (ref 6.5–8.1)

## 2021-11-30 LAB — D-DIMER, QUANTITATIVE: D-Dimer, Quant: 1.78 ug/mL-FEU — ABNORMAL HIGH (ref 0.00–0.50)

## 2021-11-30 LAB — CBC WITH DIFFERENTIAL/PLATELET
Abs Immature Granulocytes: 0.01 10*3/uL (ref 0.00–0.07)
Basophils Absolute: 0 10*3/uL (ref 0.0–0.1)
Basophils Relative: 1 %
Eosinophils Absolute: 0 10*3/uL (ref 0.0–0.5)
Eosinophils Relative: 0 %
HCT: 27.6 % — ABNORMAL LOW (ref 36.0–46.0)
Hemoglobin: 8.2 g/dL — ABNORMAL LOW (ref 12.0–15.0)
Immature Granulocytes: 1 %
Lymphocytes Relative: 19 %
Lymphs Abs: 0.4 10*3/uL — ABNORMAL LOW (ref 0.7–4.0)
MCH: 31.8 pg (ref 26.0–34.0)
MCHC: 29.7 g/dL — ABNORMAL LOW (ref 30.0–36.0)
MCV: 107 fL — ABNORMAL HIGH (ref 80.0–100.0)
Monocytes Absolute: 0.3 10*3/uL (ref 0.1–1.0)
Monocytes Relative: 12 %
Neutro Abs: 1.5 10*3/uL — ABNORMAL LOW (ref 1.7–7.7)
Neutrophils Relative %: 67 %
Platelets: 139 10*3/uL — ABNORMAL LOW (ref 150–400)
RBC: 2.58 MIL/uL — ABNORMAL LOW (ref 3.87–5.11)
RDW: 14.9 % (ref 11.5–15.5)
WBC: 2.2 10*3/uL — ABNORMAL LOW (ref 4.0–10.5)
nRBC: 0 % (ref 0.0–0.2)

## 2021-11-30 LAB — CBG MONITORING, ED
Glucose-Capillary: 45 mg/dL — ABNORMAL LOW (ref 70–99)
Glucose-Capillary: 122 mg/dL — ABNORMAL HIGH (ref 70–99)
Glucose-Capillary: 141 mg/dL — ABNORMAL HIGH (ref 70–99)
Glucose-Capillary: 136 mg/dL — ABNORMAL HIGH (ref 70–99)

## 2021-11-30 LAB — FERRITIN: Ferritin: 455 ng/mL — ABNORMAL HIGH (ref 11–307)

## 2021-11-30 LAB — PROCALCITONIN: Procalcitonin: 0.23 ng/mL

## 2021-11-30 LAB — MAGNESIUM: Magnesium: 2 mg/dL (ref 1.7–2.4)

## 2021-11-30 LAB — C-REACTIVE PROTEIN: CRP: 17.3 mg/dL — ABNORMAL HIGH (ref <1.0)

## 2021-11-30 LAB — PHOSPHORUS: Phosphorus: 3.7 mg/dL (ref 2.5–4.6)

## 2021-11-30 MED ORDER — DEXTROSE 50 % IV SOLN
INTRAVENOUS | Status: AC
  Administered 2021-11-30: 25 g via INTRAVENOUS
  Filled 2021-11-30: qty 50

## 2021-11-30 MED ORDER — DEXTROSE 50 % IV SOLN: 25.0000 g | INTRAVENOUS | Status: AC

## 2021-11-30 MED ORDER — CHLORHEXIDINE GLUCONATE CLOTH 2 % EX PADS
6.0000 | MEDICATED_PAD | Freq: Every day | CUTANEOUS | Status: DC
  Administered 2021-11-30 – 2021-12-02 (×3): 6 via TOPICAL

## 2021-11-30 MED ORDER — MELATONIN 5 MG PO TABS
5.0000 mg | ORAL_TABLET | Freq: Once | ORAL | Status: AC
  Administered 2021-11-30: 5 mg via ORAL
  Filled 2021-11-30: qty 1

## 2021-11-30 NOTE — Assessment & Plan Note (Signed)
On gemfibrozil.  As well as Lipitor.  Continue.

## 2021-11-30 NOTE — ED Notes (Signed)
Service Response called, breakfast tray ordered for pt.

## 2021-11-30 NOTE — Assessment & Plan Note (Addendum)
Acute Hypoxic respiratory failure, POA, 88 % on room air on admission.  Acute COVID-19 Viral Pneumonia CXR: hazy bilateral peripheral opacities Oxygen requirement: On room air right now CRP: 17.3> 7.5 Remdesivir: Started on 1/13 Steroids: Started on Decadron Baricitinib/Actemra: Not a candidate as there is concern for bacterial infection Antibiotics: Treated with IV cefepime and now ceftriaxone. DVT Prophylaxis: SCDs Start: 11/29/21 1118  Prone positioning and incentive spirometer use recommended.  Overall plan: Continue with current regimen. Isolation duration: First positive test date 1/13

## 2021-11-30 NOTE — Hospital Course (Signed)
76 year old female with past medical history of osteoarthritis, basal ganglia stroke, bilateral lower extremity edema, rectal cancer, carotid artery occlusion, CAD, history of cervical cancer, chronic bronchitis, history of hypothyroidism due to Graves' disease treated with RAI in April 2011, hypothyroidism, nephrolithiasis, history of sepsis secondary to UTI, hyperlipidemia, history of pneumonia, type II DM, urinary urgency who was brought to the emergency department via EMS due to altered mental status.  Found to have COVID-19 infection.

## 2021-11-30 NOTE — Assessment & Plan Note (Signed)
On aspirin.  Continue.  Recently cleared for GI bleed.

## 2021-11-30 NOTE — Assessment & Plan Note (Signed)
Follows up with Dr. Junious Silk.  Diagnosed in March 2022. SP total abdominal hysterectomy, cystoscopy with ureteral stent placement, exploratory laparotomy with low anterior resection and loop colostomy creation. Completed 6 cycles of FOLFOX. Follows up with [facility], restaging scheduled in April.

## 2021-11-30 NOTE — Assessment & Plan Note (Signed)
TSH is significantly elevated, free T4 also elevated. Currently on Synthroid 125.  We will continue.

## 2021-11-30 NOTE — Assessment & Plan Note (Signed)
Body mass index is 31.25 kg/m.  Placing the patient at high risk of poor outcome in setting of COVID-19 infection.

## 2021-11-30 NOTE — Assessment & Plan Note (Addendum)
Presents with confusion. There was some initial concern for UTI although I do not think that the patient has UTI based on the symptoms as well as urinalysis. Mentation is back to baseline. Likely this is metabolic in the setting of hypoxia.

## 2021-11-30 NOTE — Progress Notes (Signed)
Hypoglycemic protocol initiated from floor for CBG 45.

## 2021-11-30 NOTE — Assessment & Plan Note (Signed)
Blood pressure relatively stable.  Monitor.  On Lasix.  Continue.

## 2021-11-30 NOTE — Assessment & Plan Note (Signed)
Replaced.

## 2021-11-30 NOTE — Plan of Care (Signed)
Discussed with patient plan of care for the evening, pain management and COVID related activities with some teach back displayed.  Patient was able to use flutter valve with some instruction.  Problem: Education: Goal: Knowledge of General Education information will improve Description: Including pain rating scale, medication(s)/side effects and non-pharmacologic comfort measures Outcome: Progressing   Problem: Pain Managment: Goal: General experience of comfort will improve Outcome: Progressing

## 2021-11-30 NOTE — Assessment & Plan Note (Addendum)
Associated with thrombocytopenia. Hemoglobin stable at baseline around 8. Appears to be macrocytic.  Monitor.  We will check work-up.

## 2021-11-30 NOTE — Assessment & Plan Note (Signed)
On aspirin.

## 2021-11-30 NOTE — Assessment & Plan Note (Addendum)
Last hemoglobin A1c 7.7. Currently on sliding scale insulin, linagliptin and Levemir. Will discontinue linagliptin. Holding home metformin and [place].

## 2021-11-30 NOTE — Progress Notes (Signed)
Progress Note   Patient: [name] RCV:[id number] DOB: Apr 06, 1946 DOA: 11/29/2021     1 DOS: the patient was seen and examined on 11/30/2021   Brief hospital course: 76 year old female with past medical history of osteoarthritis, basal ganglia stroke, bilateral lower extremity edema, rectal cancer, carotid artery occlusion, CAD, history of cervical cancer, chronic bronchitis, history of hypothyroidism due to Graves' disease treated with RAI in April 2011, hypothyroidism, nephrolithiasis, history of sepsis secondary to UTI, hyperlipidemia, history of pneumonia, type II DM, urinary urgency who was brought to the emergency department via EMS due to altered mental status.  Found to have COVID-19 infection.  Assessment and Plan * Acute respiratory failure with hypoxia ([place])- (present on admission) Acute Hypoxic respiratory failure, POA, 88 % on room air on admission.  Acute COVID-19 Viral Pneumonia CXR: hazy bilateral peripheral opacities Oxygen requirement: On room air right now CRP: 17.3 Remdesivir: Started on 1/13 Steroids: Started on Decadron Baricitinib/Actemra: Not a candidate as there is concern for bacterial infection currently on IV cefepime. Antibiotics: IV cefepime DVT Prophylaxis: SCDs Start: 11/29/21 1118  Prone positioning and incentive spirometer use recommended.  Overall plan: Continue with current regimen. Isolation duration: First positive test date 1/02  Acute metabolic encephalopathy- (present on admission) Presents with confusion. There was some initial concern for UTI although I do not think that the patient has UTI based on the symptoms as well as urinalysis. Cultures currently pending. Mentation improving.  Likely this is metabolic in the setting of hypoxia.  Hypomagnesemia- (present on admission) Replaced.  Macrocytic anemia- (present on admission) Associated with thrombocytopenia. Hemoglobin stable at baseline around 8. Appears to be macrocytic.   Monitor.  We will check work-up.  Uncontrolled type 2 diabetes mellitus with hypoglycemia and hyperglycemia ([place])- (present on admission) Last hemoglobin A1c 7.7. Currently on sliding scale insulin, linagliptin and Levemir. Holding home metformin and [place].  Essential hypertension- (present on admission) Blood pressure relatively stable.  Monitor.  On Lasix.  Continue.  Hypothyroidism- (present on admission) TSH is significantly elevated, free T4 also elevated. Currently on Synthroid 125.  We will continue.  Hyperlipidemia associated with type 2 diabetes mellitus ([place])- (present on admission) On gemfibrozil.  As well as Lipitor.  Continue.  Rectal cancer ([place])- (present on admission) Follows up with Dr. Junious Silk.  Diagnosed in March 2022. SP total abdominal hysterectomy, cystoscopy with ureteral stent placement, exploratory laparotomy with low anterior resection and loop colostomy creation. Completed 6 cycles of FOLFOX. Follows up with [facility], restaging scheduled in April.  PAD (peripheral artery disease) ([place])- (present on admission) On aspirin.  Coronary artery disease- (present on admission) On aspirin.  Continue.  Recently cleared for GI bleed.  Obesity (BMI 30-39.9)- (present on admission) Body mass index is 31.25 kg/m.  Placing the patient at high risk of poor outcome in setting of COVID-19 infection.     Subjective: Denies any acute complaint no nausea no vomiting no fever no chills.  Breathing improving.  Does not have any burning urination or any other urinary symptoms.  Blood sugars were low in the morning due to her being n.p.o.  Objective Vitals:   11/30/21 1630 11/30/21 1645 11/30/21 1700 11/30/21 1753  BP:    104/63  Pulse: 72 76 77 81  Resp: 17 18 14 16   Temp:    98.8 F (37.1 C)  TempSrc:      SpO2: 97% 97% 96% 95%  Weight:      Height:        General: Appear  in mild distress, no Rash; Oral Mucosa Clear, moist. no Abnormal Neck Mass Or lumps,  Conjunctiva normal  Cardiovascular: S1 and S2 Present, no Murmur, Respiratory: good respiratory effort, Bilateral Air entry present and faint basal crackles, no wheezes Abdomen: Bowel Sound present, Soft and no tenderness Extremities: no Pedal edema Neurology: alert and oriented to time, place, and person affect appropriate. no new focal deficit Gait not checked due to patient safety concerns   Data Reviewed:  My review of labs, imaging, notes and other tests shows no new significant findings.   Family Communication: None at bedside.  Disposition: Status is: Inpatient  Remains inpatient appropriate because: Treatment for hypoxia in the setting of COVID infection.    Time spent: 35 minutes  Author: Berle Mull, MD 11/30/2021 5:56 PM  For on call review [email or web address].

## 2021-12-01 LAB — COMPREHENSIVE METABOLIC PANEL
ALT: 9 U/L (ref 0–44)
AST: 16 U/L (ref 15–41)
Albumin: 1.8 g/dL — ABNORMAL LOW (ref 3.5–5.0)
Alkaline Phosphatase: 73 U/L (ref 38–126)
Anion gap: 5 (ref 5–15)
BUN: 23 mg/dL (ref 8–23)
CO2: 26 mmol/L (ref 22–32)
Calcium: 7.8 mg/dL — ABNORMAL LOW (ref 8.9–10.3)
Chloride: 108 mmol/L (ref 98–111)
Creatinine, Ser: 0.65 mg/dL (ref 0.44–1.00)
GFR, Estimated: >60 mL/min (ref >60)
Glucose, Bld: 85 mg/dL (ref 70–99)
Potassium: 4.2 mmol/L (ref 3.5–5.1)
Sodium: 139 mmol/L (ref 135–145)
Total Bilirubin: 0.5 mg/dL (ref 0.3–1.2)
Total Protein: 4.9 g/dL — ABNORMAL LOW (ref 6.5–8.1)

## 2021-12-01 LAB — PROCALCITONIN: Procalcitonin: 0.17 ng/mL

## 2021-12-01 LAB — C-REACTIVE PROTEIN: CRP: 7.5 mg/dL — ABNORMAL HIGH (ref <1.0)

## 2021-12-01 LAB — GLUCOSE, CAPILLARY
Glucose-Capillary: 122 mg/dL — ABNORMAL HIGH (ref 70–99)
Glucose-Capillary: 106 mg/dL — ABNORMAL HIGH (ref 70–99)
Glucose-Capillary: 126 mg/dL — ABNORMAL HIGH (ref 70–99)
Glucose-Capillary: 91 mg/dL (ref 70–99)
Glucose-Capillary: 210 mg/dL — ABNORMAL HIGH (ref 70–99)
Glucose-Capillary: 165 mg/dL — ABNORMAL HIGH (ref 70–99)

## 2021-12-01 LAB — D-DIMER, QUANTITATIVE: D-Dimer, Quant: 2.34 ug/mL-FEU — ABNORMAL HIGH (ref 0.00–0.50)

## 2021-12-01 LAB — MAGNESIUM: Magnesium: 1.9 mg/dL (ref 1.7–2.4)

## 2021-12-01 MED ORDER — ENOXAPARIN SODIUM 40 MG/0.4ML IJ SOSY
40.0000 mg | PREFILLED_SYRINGE | INTRAMUSCULAR | Status: DC
  Administered 2021-12-01: 40 mg via SUBCUTANEOUS
  Filled 2021-12-01: qty 0.4

## 2021-12-01 MED ORDER — SODIUM CHLORIDE 0.9% FLUSH: 10.0000 mL | INTRAVENOUS | Status: DC | PRN

## 2021-12-01 MED ORDER — SODIUM CHLORIDE 0.9% FLUSH
10.0000 mL | Freq: Two times a day (BID) | INTRAVENOUS | Status: DC
  Administered 2021-12-02: 10 mL

## 2021-12-01 MED ORDER — SODIUM CHLORIDE 0.9 % IV SOLN
1.0000 g | INTRAVENOUS | Status: DC
  Administered 2021-12-01: 1 g via INTRAVENOUS
  Filled 2021-12-01: qty 10

## 2021-12-01 MED ORDER — MELATONIN 5 MG PO TABS
5.0000 mg | ORAL_TABLET | Freq: Every day | ORAL | Status: DC
  Administered 2021-12-01: 5 mg via ORAL
  Filled 2021-12-01 (×2): qty 1

## 2021-12-01 MED ORDER — ADULT MULTIVITAMIN W/MINERALS CH
1.0000 | ORAL_TABLET | Freq: Every day | ORAL | Status: DC
  Administered 2021-12-01 – 2021-12-02 (×2): 1 via ORAL
  Filled 2021-12-01 (×2): qty 1

## 2021-12-01 NOTE — Progress Notes (Signed)
Initial Nutrition Assessment  DOCUMENTATION CODES:   Obesity unspecified  INTERVENTION:   -MVI with minerals daily -Continue Glucerna Shake po TID, each supplement provides 220 kcal and 10 grams of protein   NUTRITION DIAGNOSIS:   Increased nutrient needs related to acute illness (COVID-19) as evidenced by estimated needs.  GOAL:   Patient will meet greater than or equal to 90% of their needs  MONITOR:   PO intake, Supplement acceptance, Labs, TF tolerance, Skin, I & O's  REASON FOR ASSESSMENT:   Consult Assessment of nutrition requirement/status  ASSESSMENT:   [name] is a 76 y.o. female with medical history significant of osteoarthritis, basal ganglia stroke, bilateral lower extremity edema, rectal cancer, carotid artery occlusion, CAD, history of cervical cancer, chronic bronchitis, history of hypothyroidism due to Graves' disease treated with RAI in April 2011, hypothyroidism, nephrolithiasis, history of sepsis secondary to UTI, hyperlipidemia, history of pneumonia, type II DM, urinary urgency who was brought to the emergency department via EMS due to altered mental status.  Her mental status is a lot better now.  She is currently oriented to person and place.  She knows she is at [facility].  However she still disoriented to time, date and situation.  She answers simple questions.  She denied headache, back, chest or abdominal pain at this time.  Pt admitted with acute respiratory failure with hypoxia and acute toxic encephalopathy.   Reviewed I/O's: -1.5 L x 24 hours and +1.8 L since admission  UOP: 2 L x 24 hours   Pt unavailable at time of visit. Attempted to speak with pt via call to hospital room phone, however, unable to reach. RD unable to obtain further nutrition-related history or complete nutrition-focused physical exam at this time.    Pt currently on a regular diet. Noted meal completions 100%.   Reviewed wt hx; no wt loss over the past 3  months.   Per previous RD note, pt with history of moderate malnutrition. RD unable to identify malnutrition at this time.  Pt with increased nutritional needs for COVID-19 and would benefit from addition of oral nutrition supplements.   Medications reviewed and include vitamin C, decadron, colace, ferrous sulfate, magnesium oxide, melatonin, miralax, potassium chloride,  remdesivir, and zinc sulfate.   Lab Results  Component Value Date   HGBA1C 7.7 (H) 08/26/2021   PTA DM medications are 20 units insulin degludec daily and 500 mg metfomrin BID.   Labs reviewed: CBGS: 106-187 (inpatient orders for glycemic control are 5 mg tradjenta daily, 0-20 units insulin aspart every 4 hours and 11 units insulin detemir BID).    Diet Order:   Diet Order             Diet regular Room service appropriate? Yes; Fluid consistency: Thin  Diet effective now                   EDUCATION NEEDS:   No education needs have been identified at this time  Skin:  Skin Assessment: Reviewed RN Assessment  Last BM:  11/30/21 (via colostomy)  Height:   Ht Readings from Last 1 Encounters:  11/29/21 5' (1.524 m)    Weight:   Wt Readings from Last 1 Encounters:  11/29/21 72.6 kg    Ideal Body Weight:  45.5 kg  BMI:  Body mass index is 31.25 kg/m.  Estimated Nutritional Needs:   Kcal:  1600-1800  Protein:  90-105 grams  Fluid:  > 1.6 L    [name]  W, RD, LDN, [place] Registered Dietitian II Certified Diabetes Care and Education Specialist Please refer to [facility] for RD and/or RD on-call/weekend/after hours pager

## 2021-12-01 NOTE — Progress Notes (Signed)
Progress Note   Patient: [name] WIO:[id number] DOB: 17-May-1946 DOA: 11/29/2021     2 DOS: the patient was seen and examined on 12/01/2021   Brief hospital course: 76 year old female with past medical history of osteoarthritis, basal ganglia stroke, bilateral lower extremity edema, rectal cancer, carotid artery occlusion, CAD, history of cervical cancer, chronic bronchitis, history of hypothyroidism due to Graves' disease treated with RAI in April 2011, hypothyroidism, nephrolithiasis, history of sepsis secondary to UTI, hyperlipidemia, history of pneumonia, type II DM, urinary urgency who was brought to the emergency department via EMS due to altered mental status.  Found to have COVID-19 infection.  Assessment and Plan * Acute respiratory failure with hypoxia ([place])- (present on admission) Acute Hypoxic respiratory failure, POA, 88 % on room air on admission.  Acute COVID-19 Viral Pneumonia CXR: hazy bilateral peripheral opacities Oxygen requirement: On room air right now CRP: 17.3> 7.5 Remdesivir: Started on 1/13 Steroids: Started on Decadron Baricitinib/Actemra: Not a candidate as there is concern for bacterial infection Antibiotics: Treated with IV cefepime and now ceftriaxone. DVT Prophylaxis: SCDs Start: 11/29/21 1118  Prone positioning and incentive spirometer use recommended.  Overall plan: Continue with current regimen. Isolation duration: First positive test date 3/84  Acute metabolic encephalopathy- (present on admission) Presents with confusion. There was some initial concern for UTI although I do not think that the patient has UTI based on the symptoms as well as urinalysis. Mentation is back to baseline. Likely this is metabolic in the setting of hypoxia.  Hypomagnesemia- (present on admission) Replaced.  Macrocytic anemia- (present on admission) Associated with thrombocytopenia. Hemoglobin stable at baseline around 8. Appears to be macrocytic.  Monitor.   We will check work-up.  Uncontrolled type 2 diabetes mellitus with hypoglycemia and hyperglycemia ([place])- (present on admission) Last hemoglobin A1c 7.7. Currently on sliding scale insulin, linagliptin and Levemir. Will discontinue linagliptin. Holding home metformin and [place].  Essential hypertension- (present on admission) Blood pressure relatively stable.  Monitor.  On Lasix.  Continue.  Hypothyroidism- (present on admission) TSH is significantly elevated, free T4 also elevated. Currently on Synthroid 125.  We will continue.  Hyperlipidemia associated with type 2 diabetes mellitus ([place])- (present on admission) On gemfibrozil.  As well as Lipitor.  Continue.  Rectal cancer ([place])- (present on admission) Follows up with Dr. Junious Silk.  Diagnosed in March 2022. SP total abdominal hysterectomy, cystoscopy with ureteral stent placement, exploratory laparotomy with low anterior resection and loop colostomy creation. Completed 6 cycles of FOLFOX. Follows up with [facility], restaging scheduled in April.  PAD (peripheral artery disease) ([place])- (present on admission) On aspirin.  Coronary artery disease- (present on admission) On aspirin.  Continue.  Recently cleared for GI bleed.  Obesity (BMI 30-39.9)- (present on admission) Body mass index is 31.25 kg/m.  Placing the patient at high risk of poor outcome in setting of COVID-19 infection.     Subjective: No nausea no vomiting no fever no chills.  No acute complaints.  Objective Vitals:   11/30/21 2156 12/01/21 0150 12/01/21 0556 12/01/21 1442  BP: (!) 133/51 (!) 131/54 (!) 119/54 (!) 110/49  Pulse: 65 63 (!) 55 65  Resp:    17  Temp: 97.9 F (36.6 C) 98 F (36.7 C) 97.6 F (36.4 C) 98.6 F (37 C)  TempSrc: Oral Oral Oral   SpO2: 95% 97% 96% 96%  Weight:      Height:        General: Appear in mild distress, no Rash; Oral Mucosa Clear, moist. no  Abnormal Neck Mass Or lumps, Conjunctiva normal  Cardiovascular: S1 and S2  Present, no Murmur, Respiratory: good respiratory effort, Bilateral Air entry present and CTA, no Crackles, no wheezes Abdomen: Bowel Sound present, Soft and no tenderness Extremities: no Pedal edema Neurology: alert and oriented to time, place, and person affect appropriate. no new focal deficit Gait not checked due to patient safety concerns   Data Reviewed:  I have Reviewed nursing notes, Vitals, and Lab results after pt's last encounter. Pertinent lab results CRP improving CBC stable, urine culture, blood culture so far negative and I ordered labs including CBC, CRP, BMP    Family Communication: Discussed with daughter on the phone.  She would like to take the patient home.  Disposition: Status is: Inpatient  Remains inpatient appropriate because: Ongoing requirement for IV therapy for COVID-19 infection.  Likely can go home tomorrow.    Time spent: 35 minutes  Author: Berle Mull, MD 12/01/2021 4:56 PM  For on call review [email or web address].

## 2021-12-01 NOTE — Progress Notes (Deleted)
Pt assisted to the chair by RN.  SpO2 noted to drop into the low 80's on [facility] with good wave form on continuous pulse ox.  RN increased oxygen to 4L and pt recovered quickly to low 90's.   Pt returned to 2L once recovered at rest and maintained saturations.

## 2021-12-01 NOTE — Plan of Care (Signed)
  Problem: Pain Managment: Goal: General experience of comfort will improve Outcome: Progressing   Problem: Skin Integrity: Goal: Risk for impaired skin integrity will decrease Outcome: Progressing   

## 2021-12-02 ENCOUNTER — Telehealth: Payer: Self-pay | Admitting: *Deleted

## 2021-12-02 LAB — GLUCOSE, CAPILLARY
Glucose-Capillary: 193 mg/dL — ABNORMAL HIGH (ref 70–99)
Glucose-Capillary: 53 mg/dL — ABNORMAL LOW (ref 70–99)
Glucose-Capillary: 58 mg/dL — ABNORMAL LOW (ref 70–99)
Glucose-Capillary: 81 mg/dL (ref 70–99)
Glucose-Capillary: 126 mg/dL — ABNORMAL HIGH (ref 70–99)

## 2021-12-02 LAB — CBC WITH DIFFERENTIAL/PLATELET
Abs Immature Granulocytes: 0 10*3/uL (ref 0.00–0.07)
Basophils Absolute: 0 10*3/uL (ref 0.0–0.1)
Basophils Relative: 0 %
Eosinophils Absolute: 0 10*3/uL (ref 0.0–0.5)
Eosinophils Relative: 0 %
HCT: 26.8 % — ABNORMAL LOW (ref 36.0–46.0)
Hemoglobin: 8 g/dL — ABNORMAL LOW (ref 12.0–15.0)
Immature Granulocytes: 0 %
Lymphocytes Relative: 28 %
Lymphs Abs: 0.5 10*3/uL — ABNORMAL LOW (ref 0.7–4.0)
MCH: 31.5 pg (ref 26.0–34.0)
MCHC: 29.9 g/dL — ABNORMAL LOW (ref 30.0–36.0)
MCV: 105.5 fL — ABNORMAL HIGH (ref 80.0–100.0)
Monocytes Absolute: 0.1 10*3/uL (ref 0.1–1.0)
Monocytes Relative: 8 %
Neutro Abs: 1.1 10*3/uL — ABNORMAL LOW (ref 1.7–7.7)
Neutrophils Relative %: 64 %
Platelets: 120 10*3/uL — ABNORMAL LOW (ref 150–400)
RBC: 2.54 MIL/uL — ABNORMAL LOW (ref 3.87–5.11)
RDW: 14.6 % (ref 11.5–15.5)
WBC: 1.7 10*3/uL — ABNORMAL LOW (ref 4.0–10.5)
nRBC: 0 % (ref 0.0–0.2)

## 2021-12-02 LAB — COMPREHENSIVE METABOLIC PANEL
ALT: 10 U/L (ref 0–44)
AST: 18 U/L (ref 15–41)
Albumin: 2 g/dL — ABNORMAL LOW (ref 3.5–5.0)
Alkaline Phosphatase: 85 U/L (ref 38–126)
Anion gap: 6 (ref 5–15)
BUN: 23 mg/dL (ref 8–23)
CO2: 28 mmol/L (ref 22–32)
Calcium: 8.1 mg/dL — ABNORMAL LOW (ref 8.9–10.3)
Chloride: 105 mmol/L (ref 98–111)
Creatinine, Ser: 0.78 mg/dL (ref 0.44–1.00)
GFR, Estimated: >60 mL/min (ref >60)
Glucose, Bld: 142 mg/dL — ABNORMAL HIGH (ref 70–99)
Potassium: 4.8 mmol/L (ref 3.5–5.1)
Sodium: 139 mmol/L (ref 135–145)
Total Bilirubin: 0.5 mg/dL (ref 0.3–1.2)
Total Protein: 5.2 g/dL — ABNORMAL LOW (ref 6.5–8.1)

## 2021-12-02 LAB — MAGNESIUM: Magnesium: 1.8 mg/dL (ref 1.7–2.4)

## 2021-12-02 LAB — D-DIMER, QUANTITATIVE: D-Dimer, Quant: 2.02 ug/mL-FEU — ABNORMAL HIGH (ref 0.00–0.50)

## 2021-12-02 LAB — C-REACTIVE PROTEIN: CRP: 3.9 mg/dL — ABNORMAL HIGH (ref <1.0)

## 2021-12-02 MED ORDER — MELATONIN 5 MG PO TABS: 5.0000 mg | ORAL_TABLET | Freq: Every day | ORAL | 0 refills | Status: DC

## 2021-12-02 MED ORDER — HEPARIN SOD (PORK) LOCK FLUSH 100 UNIT/ML IV SOLN
500.0000 [IU] | INTRAVENOUS | Status: AC | PRN
  Administered 2021-12-02: 500 [IU]
  Filled 2021-12-02: qty 5

## 2021-12-02 MED ORDER — GLUCERNA SHAKE PO LIQD: 237.0000 mL | Freq: Three times a day (TID) | ORAL | 0 refills | Status: DC

## 2021-12-02 MED ORDER — ADULT MULTIVITAMIN W/MINERALS CH: 1.0000 | ORAL_TABLET | Freq: Every day | ORAL | 0 refills | Status: DC

## 2021-12-02 MED ORDER — ZINC SULFATE 220 (50 ZN) MG PO CAPS: 220.0000 mg | ORAL_CAPSULE | Freq: Every day | ORAL | 0 refills | Status: DC

## 2021-12-02 MED ORDER — ASCORBIC ACID 500 MG PO TABS: 500.0000 mg | ORAL_TABLET | Freq: Every day | ORAL | 0 refills | Status: DC

## 2021-12-02 NOTE — Progress Notes (Signed)
CBG 53, pt relatively asymptomatic, only endorses headache. Orange juice given.  Recheck only 58, second juice given.   MD notified, insulin and DM medications being reviewed.

## 2021-12-02 NOTE — Evaluation (Signed)
Physical Therapy Evaluation Patient Details Name: [name] MRN: [id number] DOB: 24-Dec-1945 Today's Date: 12/02/2021  History of Present Illness  Patient is 76 y.o. female brought via EMS to ED for AMS and found to be COVID-19 postivie. [place] significant for osteoarthritis, basal ganglia stroke, bilateral lower extremity edema, rectal cancer, carotid artery occlusion, CAD, history of cervical cancer, chronic bronchitis, history of hypothyroidism due to Graves' disease treated with RAI in April 2011, hypothyroidism, nephrolithiasis, history of sepsis secondary to UTI, hyperlipidemia, history of pneumonia, type II DM, urinary urgency.    Clinical Impression  [name] is 76 y.o. female admitted with above HPI and diagnosis. Patient is currently limited by functional impairments below (see PT problem list). Patient lives with her husband and has had recent decline at baseline due to increased LE swelling and weakness. She required Mod assist to rise from EOB and was limited by bladder incontinence. Patient required min assist to move to recliner and unable to maintain balance without walker support for pericare. She has no physical assist available form spouse and daughter provides intermittent assist. Patient will benefit from continued skilled PT interventions to address impairments and progress independence with mobility, recommending . Acute PT will follow and progress as able.        Recommendations for follow up therapy are one component of a multi-disciplinary discharge planning process, led by the attending physician.  Recommendations may be updated based on patient status, additional functional criteria and insurance authorization.  Follow Up Recommendations Skilled nursing-short term rehab (<3 hours/day)    Assistance Recommended at Discharge Frequent or constant Supervision/Assistance  Patient can return home with the following  A lot of help with walking and/or  transfers;Assistance with cooking/housework;A lot of help with bathing/dressing/bathroom;Assist for transportation;Help with stairs or ramp for entrance    Equipment Recommendations None recommended by PT  Recommendations for Other Services       Functional Status Assessment Patient has had a recent decline in their functional status and demonstrates the ability to make significant improvements in function in a reasonable and predictable amount of time.     Precautions / Restrictions Precautions Precautions: Fall Restrictions Weight Bearing Restrictions: No      Mobility  Bed Mobility Overal bed mobility: Needs Assistance Bed Mobility: Supine to Sit     Supine to sit: Min assist;HOB elevated     General bed mobility comments: cues to reach for and use bed rail, assist to raise trunk and scoot hips to EOB.    Transfers Overall transfer level: Needs assistance Equipment used: Rolling walker (2 wheels) Transfers: Sit to/from Stand;Bed to chair/wheelchair/BSC Sit to Stand: Mod assist   Step pivot transfers: Min assist       General transfer comment: Patient required mod assist to power up from EOB. Patient limited by urinary incontinence on rising unable to get to [facility] in time. Min assist to steady with steps from bed>recliner. pt reaching back with Bil UE to control lowering to chair.    Ambulation/Gait                  Stairs            Wheelchair Mobility    Modified Rankin (Stroke Patients Only)       Balance Overall balance assessment: Needs assistance Sitting-balance support: Feet supported Sitting balance-Leahy Scale: Good     Standing balance support: Reliant on assistive device for balance;During functional activity;Bilateral upper extremity supported Standing balance-Leahy Scale: Poor Standing balance  comment: pt reliant on RW for support. stood in front of recliner and therapist assisted with pericare after incontinence as pt unable to  hold balance and perform herself.                             Pertinent Vitals/Pain Pain Assessment: No/denies pain    Home Living Family/patient expects to be discharged to:: Private residence Living Arrangements: Spouse/significant other Available Help at Discharge: Family;Available 24 hours/day Type of Home: House Home Access: Ramped entrance       Home Layout: One level Home Equipment: Conservation officer, nature (2 wheels);Rollator (4 wheels) Additional Comments: daughter comes by almost every day but she has cancer herself. pt is typically assisting her husband and daugther assist's both of them    Prior Function Prior Level of Function : Independent/Modified Independent             Mobility Comments: pt reports major decline in last 2 weeks since she stopped walking due to LE edema.       Hand Dominance   Dominant Hand: Right    Extremity/Trunk Assessment   Upper Extremity Assessment Upper Extremity Assessment: Overall WFL for tasks assessed    Lower Extremity Assessment Lower Extremity Assessment: Generalized weakness    Cervical / Trunk Assessment Cervical / Trunk Assessment: Normal  Communication   Communication: No difficulties  Cognition Arousal/Alertness: Awake/alert Behavior During Therapy: WFL for tasks assessed/performed Overall Cognitive Status: Within Functional Limits for tasks assessed                                          General Comments      Exercises     Assessment/Plan    PT Assessment Patient needs continued PT services  PT Problem List Decreased strength;Decreased activity tolerance;Decreased balance;Decreased mobility;Decreased knowledge of use of DME;Decreased safety awareness;Decreased knowledge of precautions       PT Treatment Interventions DME instruction;Gait training;Stair training;Functional mobility training;Therapeutic activities;Therapeutic exercise;Balance training;Patient/family education     PT Goals (Current goals can be found in the Care Plan section)  Acute Rehab PT Goals Patient Stated Goal: regain strength and independence PT Goal Formulation: With patient Time For Goal Achievement: 12/16/21 Potential to Achieve Goals: Good    Frequency Min 3X/week     Co-evaluation               AM-PAC PT "6 Clicks" Mobility  Outcome Measure Help needed turning from your back to your side while in a flat bed without using bedrails?: A Little Help needed moving from lying on your back to sitting on the side of a flat bed without using bedrails?: A Little Help needed moving to and from a bed to a chair (including a wheelchair)?: A Little Help needed standing up from a chair using your arms (e.g., wheelchair or bedside chair)?: A Little Help needed to walk in hospital room?: A Lot Help needed climbing 3-5 steps with a railing? : A Lot 6 Click Score: 16    End of Session Equipment Utilized During Treatment: Gait belt Activity Tolerance: Patient tolerated treatment well Patient left: with call bell/phone within reach;with family/visitor present;with chair alarm set Nurse Communication: Mobility status PT Visit Diagnosis: Muscle weakness (generalized) (M62.81);Difficulty in walking, not elsewhere classified (R26.2);Other symptoms and signs involving the nervous system (R29.898);Unsteadiness on feet (R26.81)    Time:  3582-5189 PT Time Calculation (min) (ACUTE ONLY): 40 min   Charges:   PT Evaluation $PT Eval Low Complexity: 1 Low PT Treatments $Therapeutic Activity: 23-37 mins        Verner Mould, DPT Acute Rehabilitation Services Office [phone] Pager [phone]   Jacques Navy 12/02/2021, 12:26 PM

## 2021-12-02 NOTE — Discharge Planning (Signed)
Oncology Discharge Planning Note  [facility] Address: [address] [place], [place], [place] [address] Hours of Operation:  Nena Polio, Monday - Friday  Clinic Contact Information:  [phone]) [phone]  Oncology Care Team: Medical Oncologist:  Dr. Betsy Coder  Patient Details: Name:  [name], [name] MRN:   [id number] DOB:   1946/03/17 Reason for Current Admission: Acute respiratory failure with hypoxia [facility])  Discharge Planning Narrative: Notification of admission received by [facility] for Nilsa Nutting.  Discharge follow-up appointments for oncology are current and available on the AVS and MyChart.   Upon discharge from the hospital, hematology/oncology's post discharge plan of care for the outpatient setting is: Follow up as scheduled on 12/18/21   Korynn Kenedy will be called within two business days after discharge to review hematology/oncology's plan of care for full understanding.    Outpatient Oncology Specific Care Only: Oncology appointment transportation needs addressed?:  yes Oncology medication management for symptom management addressed?:  no Chemo Alert Card reviewed?:  not applicable Immunotherapy Alert Card reviewed?:  not applicable

## 2021-12-02 NOTE — Telephone Encounter (Signed)
Medication has been approved

## 2021-12-02 NOTE — Progress Notes (Signed)
Pt discharged. Pt with infusaport, consult to IV team to de-access placed.  Paperwork printed and reviewed accordingly. RN will notify family to pick up patient once pt is ready to go.

## 2021-12-02 NOTE — Plan of Care (Signed)
Pt aox4, pleasant and cooperative.  +COVID 19. IV abx/antiviral per orders. On RA.  PT/OT consults.    Problem: Activity: Goal: Risk for activity intolerance will decrease Outcome: Progressing   Problem: Pain Managment: Goal: General experience of comfort will improve Outcome: Progressing   Problem: Safety: Goal: Ability to remain free from injury will improve Outcome: Progressing   Problem: Skin Integrity: Goal: Risk for impaired skin integrity will decrease Outcome: Progressing

## 2021-12-02 NOTE — Plan of Care (Signed)
Pt aox4, pleasant and cooperative, forgetful at times.  Up with assist to chair with PT this am.  IV Remdesivir per orders.  Hypoglycemic event this am, resolved, meds adjusted by md.  Fall/Skin precautions in place.   Problem: Activity: Goal: Risk for activity intolerance will decrease Outcome: Progressing   Problem: Pain Managment: Goal: General experience of comfort will improve Outcome: Progressing   Problem: Safety: Goal: Ability to remain free from injury will improve Outcome: Progressing   Problem: Skin Integrity: Goal: Risk for impaired skin integrity will decrease Outcome: Progressing

## 2021-12-02 NOTE — Telephone Encounter (Signed)
Oncology Discharge Planning Admission Note  [facility] Address: [address], [place],  [address] Hours of Operation:  8am - 5pm, Monday - Friday  Clinic Contact Information:  [phone]  Oncology Care Team: Medical Oncologist:  Dr. Betsy Coder  Contacted  [name], daughter  to inform that the oncology provider Dr. Benay Spice is aware of this hospital admission dated 11/29/2021, and the cancer center will follow [facility] inpatient care to assist with discharge planning as indicated by the oncologist.  We will reach out to you closer to discharge date to arrange your follow up care. MD is suggesting SNF rehab for couple weeks, but family wishes to take her home with PT and CMA assisting w/care. She expects a call from [facility] today.   Disclaimer:  This [place] note does not imply a formal consult request has been made by the admitting attending for this admission or there will be an inpatient consult completed by oncology.  Please request oncology consults as per standard process as indicated.

## 2021-12-02 NOTE — Plan of Care (Signed)
  Problem: Safety: Goal: Ability to remain free from injury will improve Outcome: Progressing   Problem: Skin Integrity: Goal: Risk for impaired skin integrity will decrease Outcome: Progressing   

## 2021-12-02 NOTE — Plan of Care (Signed)
Pt for discharge home this evening with family.  AVS Printed and reviewed with pt at bedside, and with dtr Maudie Mercury over the phone.  New medications and follow ups discussed in detail. All questions addressed, verbalized understanding.  Problem: Education: Goal: Knowledge of General Education information will improve Description: Including pain rating scale, medication(s)/side effects and non-pharmacologic comfort measures 12/02/2021 1521 by Rutherford Guys, RN Outcome: Adequate for Discharge   Problem: Activity: Goal: Risk for activity intolerance will decrease 12/02/2021 1521 by Rutherford Guys, RN Outcome: Adequate for Discharge   Problem: Pain Managment: Goal: General experience of comfort will improve 12/02/2021 1521 by Rutherford Guys, RN Outcome: Adequate for Discharge   Problem: Safety: Goal: Ability to remain free from injury will improve 12/02/2021 1521 by Rutherford Guys, RN Outcome: Adequate for Discharge   Problem: Skin Integrity: Goal: Risk for impaired skin integrity will decrease 12/02/2021 1521 by Rutherford Guys, RN Outcome: Adequate for Discharge

## 2021-12-02 NOTE — Discharge Instructions (Signed)
PER MEDICAL ONCOLOGY: Follow up with Dr. Benay Spice as scheduled on 12/18/21 at 1:45 for lab/port flush/office visit. Call with any questions or concerns at [phone]

## 2021-12-02 NOTE — TOC Initial Note (Signed)
Transition of Care [facility]) - Initial/Assessment Note    Patient Details  Name: [name] MRN: [id number] Date of Birth: 01-11-1946  [facility] CM/SW Contact:    Lynnell Catalan, RN Phone Number: 12/02/2021, 1:59 PM  Clinical Narrative:                 Physical therapy recommendation of SNF reviewed with both pt and daughter Joelene Millin. Both refuse SNF. Joelene Millin states pt is active with [facility] for HHPT/RN and they would like to continue services at dc. [place] liaison contacted for resumption of services.  Expected Discharge Plan: [place] Barriers to Discharge: No Barriers Identified   Patient Goals and CMS Choice Patient states their goals for this hospitalization and ongoing recovery are:: To go home. CMS Medicare.gov Compare Post Acute Care list provided to:: Patient Represenative (must comment) Choice offered to / list presented to : Adult Children  Expected Discharge Plan and Services Expected Discharge Plan: [place]   Discharge Planning Services: CM Consult Post Acute Care Choice: [place] arrangements for the past 2 months: Single Family Home Expected Discharge Date: 12/02/21                         HH Arranged: RN, PT HH Agency: [place] [facility] Agency Contacted: 12/02/21 Time HH Agency Contacted: 71 Representative spoke with at [place]: [place] Arrangements/Services Living arrangements for the past 2 months: [place] with:: Spouse Patient language and need for interpreter reviewed:: Yes Do you feel safe going back to the place where you live?: Yes      Need for Family Participation in Patient Care: Yes (Comment) Care giver support system in place?: Yes (comment) Current home services: Home PT, Home RN Criminal Activity/Legal Involvement Pertinent to Current Situation/Hospitalization: No - Comment as needed  Activities of Daily Living Home  Assistive Devices/Equipment: None ADL Screening (condition at time of admission) Patient's cognitive ability adequate to safely complete daily activities?: Yes Is the patient deaf or have difficulty hearing?: No Does the patient have difficulty seeing, even when wearing glasses/contacts?: Yes Does the patient have difficulty concentrating, remembering, or making decisions?: No Patient able to express need for assistance with ADLs?: Yes Does the patient have difficulty dressing or bathing?: No Independently performs ADLs?: Yes (appropriate for developmental age) Does the patient have difficulty walking or climbing stairs?: No Weakness of Legs: None Weakness of Arms/Hands: None  Permission Sought/Granted Permission sought to share information with : Facility Art therapist granted to share information with : Yes, Verbal Permission Granted     Permission granted to share info w AGENCY: [facility]        Emotional Assessment Appearance:: Appears stated age Attitude/Demeanor/Rapport: Gracious Affect (typically observed): Calm Orientation: : Oriented to Self, Oriented to Place, Oriented to  Time, Oriented to Situation Alcohol / Substance Use: Not Applicable Psych Involvement: No (comment)  Admission diagnosis:  Acute respiratory failure with hypoxia (HCC) [J96.01] Sepsis, due to unspecified organism, unspecified whether acute organ dysfunction present ([place]) [A41.9] COVID-19 [U07.1] Patient Active Problem List   Diagnosis Date Noted   PAD (peripheral artery disease) ([place]) 11/30/2021   Obesity (BMI 30-39.9) 11/30/2021   Acute respiratory failure with hypoxia ([place]) 11/29/2021   Sepsis due to pneumonia ([place]) 11/29/2021   Severe protein-calorie malnutrition ([place]) 11/29/2021   Acute respiratory disease due to COVID-19 virus 11/29/2021   Duodenal ulcer  Acute gastric ulcer with hemorrhage    Abnormal CT of the abdomen    Malnutrition of moderate degree 10/09/2021    Sepsis ([place]) 10/08/2021   Symptomatic anemia 09/23/2021   Pressure injury of skin 08/26/2021   AKI (acute kidney injury) ([place]) 08/25/2021   Sepsis secondary to UTI ([place]) 08/25/2021   Goals of care, counseling/discussion 07/01/2021   Coronary artery disease 03/13/2021   Cellulitis of right lower limb 02/22/2021   Malignant neoplasm of overlapping sites of cervix ([place]) 02/18/2021   Rectal cancer ([place]) 01/29/2021   Rectal mass    Hydronephrosis    Iron deficiency anemia due to chronic blood loss    Non-intractable vomiting    Lower abdominal pain    UTI (urinary tract infection) 96/29/5284   Acute metabolic encephalopathy 13/24/4010   Peripheral nerve disease 09/07/2020   Vitamin D deficiency 09/07/2020   Carotid artery stenosis, symptomatic, right 03/28/2020   Preoperative clearance 03/21/2020   History of COVID-19 02/22/2020   Carotid artery disease ([place]) 02/22/2020   History of CVA (cerebrovascular accident) 02/11/2020   Macrocytic anemia 11/29/2019   Hypokalemia 07/27/2019   Hypomagnesemia 07/27/2019   History of cervical cancer 03/18/2019   Uncontrolled type 2 diabetes mellitus with hypoglycemia and hyperglycemia ([place]) 05/04/2018   Essential hypertension 05/04/2018   Bilateral lower extremity edema 07/31/2017   Hypothyroidism 06/06/2010   Hyperlipidemia associated with type 2 diabetes mellitus ([place]) 10/20/2008   PCP:  Everardo Beals, NP Pharmacy:   [place] - [address] [place] [address] Phone: [phone] Fax: [phone]     Social Determinants of Health (SDOH) Interventions    Readmission Risk Interventions Readmission Risk Prevention Plan 12/02/2021 03/29/2020  Transportation Screening Complete Complete  HRI or [place] - Complete  Social Work Consult for [place] Planning/Counseling - Complete  Palliative Care Screening - Complete  Medication Review Press photographer)  Complete Complete  PCP or Specialist appointment within 3-5 days of discharge Complete -  [place] or Home Care Consult Complete -  SW Recovery Care/Counseling Consult Complete -  [place] Patient Refused -  Some recent data might be hidden

## 2021-12-03 NOTE — Discharge Summary (Signed)
Physician Discharge Summary   Patient: [name] MRN: [id number] DOB: June 13, 1946  Admit date:     11/29/2021  Discharge date: 12/02/2021  Discharge Physician: Berle Mull   PCP: Everardo Beals, NP   Recommendations at discharge:   Follow-up with PCP as recommended.  Discharge Diagnoses Principal Problem:   Acute respiratory failure with hypoxia (HCC) Active Problems:   Acute respiratory disease due to COVID-19 virus   Hypomagnesemia   Acute metabolic encephalopathy   Macrocytic anemia   Uncontrolled type 2 diabetes mellitus with hypoglycemia and hyperglycemia (HCC)   Essential hypertension   Hypothyroidism   Hyperlipidemia associated with type 2 diabetes mellitus (HCC)   Rectal cancer (HCC)   Coronary artery disease   PAD (peripheral artery disease) (HCC)   Severe protein-calorie malnutrition (HCC)   Obesity (BMI 30-39.9)  Resolved Problems:   Type 2 diabetes mellitus [facility])   Hospital Course   76 year old female with past medical history of osteoarthritis, basal ganglia stroke, bilateral lower extremity edema, rectal cancer, carotid artery occlusion, CAD, history of cervical cancer, chronic bronchitis, history of hypothyroidism due to Graves' disease treated with RAI in April 2011, hypothyroidism, nephrolithiasis, history of sepsis secondary to UTI, hyperlipidemia, history of pneumonia, type II DM, urinary urgency who was brought to the emergency department via EMS due to altered mental status.  Found to have COVID-19 infection.  * Acute respiratory failure with hypoxia ([place])- (present on admission) Acute Hypoxic respiratory failure, POA, 88 % on room air on admission.  Acute COVID-19 Viral Pneumonia CXR: hazy bilateral peripheral opacities Oxygen requirement: On room air right now CRP: 17.3> 7.5 Remdesivir: Started on 1/13 Steroids: Started on Decadron Baricitinib/Actemra: Not a candidate as there is concern for bacterial infection Antibiotics: Treated with  IV cefepime and now ceftriaxone. Completed therapy DVT Prophylaxis: SCDs Start: 11/29/21 1118  Prone positioning and incentive spirometer use recommended.  Overall plan: Continue with current regimen. Isolation duration: First positive test date 0/10  Acute metabolic encephalopathy- (present on admission) Presents with confusion. There was some initial concern for UTI although I do not think that the patient has UTI based on the symptoms as well as urinalysis. Mentation is back to baseline. Likely this is metabolic in the setting of hypoxia.  Hypomagnesemia- (present on admission) Replaced.  Macrocytic anemia- (present on admission) Associated with thrombocytopenia. Hemoglobin stable at baseline around 8. Appears to be macrocytic.  Monitor.  We will check work-up.  Uncontrolled type 2 diabetes mellitus with hypoglycemia and hyperglycemia ([place])- (present on admission) Last hemoglobin A1c 7.7. Currently on sliding scale insulin, linagliptin and Levemir. Will discontinue linagliptin. Holding home metformin and [place].  Essential hypertension- (present on admission) Blood pressure relatively stable.  Monitor.  On Lasix.  Continue.  Hypothyroidism- (present on admission) TSH is significantly elevated, free T4 also elevated. Currently on Synthroid 125.  We will continue.  Hyperlipidemia associated with type 2 diabetes mellitus ([place])- (present on admission) On gemfibrozil.  As well as Lipitor.  Continue.  Rectal cancer ([place])- (present on admission) Follows up with Dr. Junious Silk.  Diagnosed in March 2022. SP total abdominal hysterectomy, cystoscopy with ureteral stent placement, exploratory laparotomy with low anterior resection and loop colostomy creation. Completed 6 cycles of FOLFOX. Follows up with [facility], restaging scheduled in April.  PAD (peripheral artery disease) ([place])- (present on admission) On aspirin.  Coronary artery disease- (present on admission) On aspirin.   Continue.  Recently cleared for GI bleed.  Obesity (BMI 30-39.9)- (present on admission) Body mass index is 31.25 kg/m.  Placing the patient at high risk of poor outcome in setting of COVID-19 infection.    Consultants: none Procedures performed: none  Disposition: Home Diet recommendation: Cardiac diet  DISCHARGE MEDICATION: Allergies as of 12/02/2021       Reactions   Codeine Nausea Only        Medication List     STOP taking these medications    ciprofloxacin 500 MG tablet Commonly known as: CIPRO   doxycycline 100 MG tablet Commonly known as: VIBRA-TABS   fluconazole 100 MG tablet Commonly known as: Diflucan       TAKE these medications    ascorbic acid 500 MG tablet Commonly known as: VITAMIN C Take 1 tablet (500 mg total) by mouth daily.   aspirin 81 MG EC tablet Take 1 tablet (81 mg total) by mouth daily. RESUME AFTER 1 WEEK What changed:  when to take this additional instructions   atorvastatin 40 MG tablet Commonly known as: LIPITOR Take 40 mg by mouth every morning.   baclofen 10 MG tablet Commonly known as: LIORESAL Take 10 mg by mouth 4 (four) times daily as needed for muscle spasms.   cephALEXin 250 MG capsule Commonly known as: KEFLEX Take 1 capsule (250 mg total) by mouth daily. RESUME AFTER YOU HAVE COMPLETED THE COURSE OF CIPRO What changed:  when to take this additional instructions   clopidogrel 75 MG tablet Commonly known as: PLAVIX Take 1 tablet (75 mg total) by mouth at bedtime.   docusate sodium 100 MG capsule Commonly known as: COLACE Take 100 mg by mouth 2 (two) times daily.   feeding supplement (GLUCERNA SHAKE) Liqd Take 237 mLs by mouth 3 (three) times daily between meals.   ferrous sulfate 325 (65 FE) MG tablet Take 1 tablet (325 mg total) by mouth 2 (two) times daily with a meal.   furosemide 20 MG tablet Commonly known as: LASIX Take 20 mg by mouth daily.   gabapentin 300 MG capsule Commonly known as:  NEURONTIN Take 300-600 mg by mouth See admin instructions. Take one capsule (300 mg) by mouth every morning and two capsules (600 mg) at night   gemfibrozil 600 MG tablet Commonly known as: LOPID Take 600 mg by mouth 2 (two) times daily.   levothyroxine 125 MCG tablet Commonly known as: SYNTHROID Take 1 tablet (125 mcg total) by mouth daily at 6 (six) AM.   magnesium oxide 400 (240 Mg) MG tablet Commonly known as: MAG-OX Take 1 tablet (400 mg total) by mouth 2 (two) times daily.   melatonin 5 MG Tabs Take 1 tablet (5 mg total) by mouth at bedtime.   metFORMIN 500 MG tablet Commonly known as: GLUCOPHAGE Take 500 mg by mouth 2 (two) times daily.   multivitamin with minerals Tabs tablet Take 1 tablet by mouth daily.   nystatin cream Commonly known as: MYCOSTATIN Apply 1 application topically 2 (two) times daily as needed (groin rash). Mix with triamcinolone cream   nystatin powder Commonly known as: MYCOSTATIN/NYSTOP Apply 1 application topically 2 (two) times daily. To groin rash   ondansetron 8 MG tablet Commonly known as: ZOFRAN Take 1 tablet (8 mg total) by mouth every 6 (six) hours as needed for nausea or vomiting.   pantoprazole 40 MG tablet Commonly known as: PROTONIX Take 1 tablet (40 mg total) by mouth 2 (two) times daily.   polyethylene glycol powder 17 GM/SCOOP powder Commonly known as: GLYCOLAX/MIRALAX Take 17 g by mouth daily.   potassium chloride SA 20 MEQ  tablet Commonly known as: KLOR-CON M Take 1 tablet (20 mEq total) by mouth 2 (two) times daily.   prochlorperazine 10 MG tablet Commonly known as: COMPAZINE Take 10 mg by mouth every 6 (six) hours as needed for nausea or vomiting.   sertraline 25 MG tablet Commonly known as: ZOLOFT Take 25 mg by mouth daily.   Tyler Aas FlexTouch 100 UNIT/ML FlexTouch Pen Generic drug: insulin degludec Inject 10 Units into the skin daily. What changed:  how much to take when to take this reasons to take this    triamcinolone cream 0.1 % Commonly known as: KENALOG Apply 1 application topically 2 (two) times daily as needed (groin rash). Mix with nystatin cream   Xtampza ER 9 MG C12a Generic drug: oxyCODONE ER Take 1 capsule by mouth 2 (two) times daily.   zinc sulfate 220 (50 Zn) MG capsule Take 1 capsule (220 mg total) by mouth daily.         Discharge Exam: Filed Weights   11/29/21 0853  Weight: 72.6 kg   Vitals:   12/01/21 1442 12/01/21 1945 12/02/21 0317 12/02/21 1142  BP: (!) 110/49 (!) 128/53 140/61 (!) 123/49  Pulse: 65 67 (!) 57 62  Resp: 17 18 18 18   Temp: 98.6 F (37 C) 98 F (36.7 C) 98.1 F (36.7 C) 98 F (36.7 C)  TempSrc:  Oral Oral Oral  SpO2: 96% 93% 96% 90%  Weight:      Height:        General: Appear in mild distress, no Rash; Oral Mucosa Clear, moist. no Abnormal Neck Mass Or lumps, Conjunctiva normal  Cardiovascular: S1 and S2 Present, no Murmur, Respiratory: good respiratory effort, Bilateral Air entry present and CTA, no Crackles, no wheezes Abdomen: Bowel Sound present, Soft and no tenderness Extremities: no Pedal edema Neurology: alert and oriented to time, place, and person affect appropriate. no new focal deficit Gait not checked due to patient safety concerns    Condition at discharge: good  The results of significant diagnostics from this hospitalization (including imaging, microbiology, ancillary and laboratory) are listed below for reference.   Imaging Studies: DG Chest 2 View  Result Date: 11/29/2021 CLINICAL DATA:  76 year old female with history of shortness of breath and decreased oxygen saturation. EXAM: CHEST - 2 VIEW COMPARISON:  Chest x-ray 10/08/2021. FINDINGS: Right internal jugular single-lumen porta cath with tip terminating at the superior cavoatrial junction. Lung volumes are normal. Diffuse peribronchial cuffing and scattered areas of interstitial prominence are noted throughout the lungs bilaterally. No consolidative airspace  disease. No pleural effusions. No pneumothorax. No pulmonary nodule or mass noted. Pulmonary vasculature and the cardiomediastinal silhouette are within normal limits. Atherosclerosis in the thoracic aorta. IMPRESSION: 1. The appearance the chest is concerning for severe bronchitis, likely with developing multilobar bilateral bronchopneumonia. 2. Aortic atherosclerosis. Electronically Signed   By: Vinnie Langton M.D.   On: 11/29/2021 09:12   CT Head Wo Contrast  Result Date: 11/07/2021 CLINICAL DATA:  Head trauma, minor (Age >= 65y) Mental status change, unknown cause EXAM: CT HEAD WITHOUT CONTRAST TECHNIQUE: Contiguous axial images were obtained from the base of the skull through the vertex without intravenous contrast. COMPARISON:  09/05/2021 FINDINGS: Brain: There is no acute intracranial hemorrhage, mass effect, or edema. Gray-white differentiation is preserved. Small chronic infarct of the left basal ganglia and adjacent white matter. Minimal patchy hypoattenuation in the supratentorial white matter is nonspecific but probably reflects stable minor chronic microvascular ischemic changes. There is no extra-axial fluid collection.  Ventricles and sulci are stable in size and configuration. Vascular: There is atherosclerotic calcification at the skull base. Skull: Calvarium is unremarkable. Sinuses/Orbits: No acute finding. Other: None. IMPRESSION: No evidence of acute intracranial injury. Electronically Signed   By: Macy Mis M.D.   On: 11/07/2021 10:17   CT Cervical Spine Wo Contrast  Result Date: 11/07/2021 CLINICAL DATA:  Neck trauma (Age >= 65y) EXAM: CT CERVICAL SPINE WITHOUT CONTRAST TECHNIQUE: Multidetector CT imaging of the cervical spine was performed without intravenous contrast. Multiplanar CT image reconstructions were also generated. COMPARISON:  None. FINDINGS: Alignment: Normal Skull base and vertebrae: There is a fracture through the spinous process at C5. No vertebral body  fracture. Sclerotic focus in the C2 vertebral body. This was present on prior CTA neck from 02/11/2020. This could reflect bone island, but recommend correlation with PSA level. Soft tissues and spinal canal: No prevertebral fluid or swelling. No visible canal hematoma. Disc levels:  Diffuse degenerative disc disease and facet disease. Upper chest: No acute findings Other: None IMPRESSION: Fracture through the C5 spinous process posteriorly. No vertebral body fracture. Sclerotic focus in the C2 vertebral body which could reflect bone island but recommend correlation with PSA level or cancer history to exclude sclerotic metastasis. This was present on prior CTA neck from 2021. Degenerative changes. Electronically Signed   By: Rolm Baptise M.D.   On: 11/07/2021 10:17   VAS [place] LOWER EXTREMITY VENOUS (DVT)  Result Date: 11/06/2021  Lower Venous DVT Study Patient Name:  AMYRAH PINKHASOV  Date of Exam:   11/05/2021 Medical Rec #: [id number]         Accession #:    [id number] Date of Birth: [birth year]         Patient Gender: F Patient Age:   37 years Exam Location:  [facility] Procedure:      VAS [place] LOWER EXTREMITY VENOUS (DVT) Referring Phys: Betsy Coder --------------------------------------------------------------------------------  Indications: Left lower calf swelling x 1 month. Patient indicates she has noticed increased SOB during this time but denies chest pain.  Risk Factors: Cancer Colon. Comparison Study: Prior bilateral leg venous duplex on 06/12/2021 was negative                   for DVT with limitations noted. Performing Technologist: Salvadore Dom RVT, RDCS (AE), RDMS  Examination Guidelines: A complete evaluation includes B-mode imaging, spectral Doppler, color Doppler, and power Doppler as needed of all accessible portions of each vessel. Bilateral testing is considered an integral part of a complete examination. Limited examinations for reoccurring indications may be performed as noted. The reflux  portion of the exam is performed with the patient in reverse Trendelenburg.  +---------+---------------+---------+-----------+----------+--------------+  RIGHT     Compressibility Phasicity Spontaneity Properties Thrombus Aging  +---------+---------------+---------+-----------+----------+--------------+  CFV       Full            Yes       Yes                                    +---------+---------------+---------+-----------+----------+--------------+  SFJ       Full            Yes       Yes                                    +---------+---------------+---------+-----------+----------+--------------+  FV Prox   Full            Yes       Yes                                    +---------+---------------+---------+-----------+----------+--------------+  FV Mid    Full            Yes       Yes                                    +---------+---------------+---------+-----------+----------+--------------+  FV Distal Full            Yes       Yes                                    +---------+---------------+---------+-----------+----------+--------------+  PFV       Full                                                             +---------+---------------+---------+-----------+----------+--------------+  POP       Full            Yes       Yes                                    +---------+---------------+---------+-----------+----------+--------------+  PTV       Full            Yes       Yes                                    +---------+---------------+---------+-----------+----------+--------------+  PERO      Full            Yes       Yes                                    +---------+---------------+---------+-----------+----------+--------------+  Gastroc   Full                                                             +---------+---------------+---------+-----------+----------+--------------+  GSV       Full            Yes       Yes                                     +---------+---------------+---------+-----------+----------+--------------+   +----+---------------+---------+-----------+----------+--------------+  LEFT Compressibility Phasicity Spontaneity Properties Thrombus Aging  +----+---------------+---------+-----------+----------+--------------+  CFV  Full  Yes       Yes                                    +----+---------------+---------+-----------+----------+--------------+    Findings reported to Julien Girt through secure chat in EPIC at 11:00 am.  Summary: RIGHT: - No evidence of deep vein thrombosis in the lower extremity. No indirect evidence of obstruction proximal to the inguinal ligament. - No cystic structure found in the popliteal fossa. - Mild superficial edema noted in the medial lower calf.  LEFT: - No evidence of common femoral vein obstruction.  *See table(s) above for measurements and observations. Electronically signed by Ida Rogue MD on 11/06/2021 at 1:45:12 PM.    Final     Microbiology: Results for orders placed or performed during the hospital encounter of 11/29/21  Blood Culture (routine x 2)     Status: None (Preliminary result)   Collection Time: 11/29/21  8:15 AM   Specimen: BLOOD  Result Value Ref Range Status   Specimen Description   Final    BLOOD LEFT ANTECUBITAL Performed at [facility], [place] [address], [place], [place] [address]    Special Requests   Final    BOTTLES DRAWN AEROBIC AND ANAEROBIC Blood Culture adequate volume Performed at [facility] [address], [place], [place] [address]    Culture   Final    NO GROWTH 4 DAYS Performed at [facility], [place] [address], [place], [place] [address]    Report Status PENDING  Incomplete  Resp Panel by RT-PCR (Flu A&B, Covid) Nasopharyngeal Swab     Status: Abnormal   Collection Time: 11/29/21  8:15 AM   Specimen: Nasopharyngeal Swab; Nasopharyngeal(NP) swabs in vial transport medium  Result Value Ref  Range Status   SARS Coronavirus 2 by RT PCR POSITIVE (A) NEGATIVE Final    Comment: (NOTE) SARS-CoV-2 target nucleic acids are DETECTED.  The SARS-CoV-2 RNA is generally detectable in upper respiratory specimens during the acute phase of infection. Positive results are indicative of the presence of the identified virus, but do not rule out bacterial infection or co-infection with other pathogens not detected by the test. Clinical correlation with patient history and other diagnostic information is necessary to determine patient infection status. The expected result is Negative.  Fact Sheet for Patients: EntrepreneurPulse.com.au  Fact Sheet for Healthcare Providers: IncredibleEmployment.be  This test is not yet approved or cleared by the [place] FDA and  has been authorized for detection and/or diagnosis of SARS-CoV-2 by FDA under an Emergency Use Authorization (EUA).  This EUA will remain in effect (meaning this test can be used) for the duration of  the COVID-19 declaration under Section 564(b)(1) of the A ct, 21 U.S.C. section 360bbb-3(b)(1), unless the authorization is terminated or revoked sooner.     Influenza A by PCR NEGATIVE NEGATIVE Final   Influenza B by PCR NEGATIVE NEGATIVE Final    Comment: (NOTE) The Xpert Xpress SARS-CoV-2/FLU/RSV plus assay is intended as an aid in the diagnosis of influenza from Nasopharyngeal swab specimens and should not be used as a sole basis for treatment. Nasal washings and aspirates are unacceptable for Xpert Xpress SARS-CoV-2/FLU/RSV testing.  Fact Sheet for Patients: EntrepreneurPulse.com.au  Fact Sheet for Healthcare Providers: IncredibleEmployment.be  This test is not yet approved or cleared by the [place] FDA and has been authorized for detection and/or diagnosis of SARS-CoV-2  by FDA under an Emergency Use Authorization (EUA). This EUA will  remain in effect (meaning this test can be used) for the duration of the COVID-19 declaration under Section 564(b)(1) of the Act, 21 U.S.C. section 360bbb-3(b)(1), unless the authorization is terminated or revoked.  Performed at [facility], [place] [address], [place], [place] [address]   Blood Culture (routine x 2)     Status: None (Preliminary result)   Collection Time: 11/29/21  8:53 AM   Specimen: BLOOD  Result Value Ref Range Status   Specimen Description   Final    BLOOD PORTA CATH Performed at [facility] [address], [place], [place] [address]    Special Requests   Final    BOTTLES DRAWN AEROBIC AND ANAEROBIC Blood Culture adequate volume Performed at [facility] [address], [place], [place] [address]    Culture   Final    NO GROWTH 4 DAYS Performed at [facility], [place] [address] [place], [place] [address]    Report Status PENDING  Incomplete  Urine Culture     Status: None   Collection Time: 11/29/21  9:20 AM   Specimen: In/Out Cath Urine  Result Value Ref Range Status   Specimen Description   Final    IN/OUT CATH URINE Performed at [facility] [address], [place], [place] [address]    Special Requests   Final    NONE Performed at [facility], [place] [address], [place], [place] [address]    Culture   Final    NO GROWTH Performed at [facility], [place] [address], [place], [place] [address]    Report Status 11/30/2021 FINAL  Final  MRSA Next Gen by PCR, Nasal     Status: None   Collection Time: 11/29/21  1:26 PM   Specimen: Nasal Mucosa; Nasal Swab  Result Value Ref Range Status   MRSA by PCR Next Gen NOT DETECTED NOT DETECTED Final    Comment: (NOTE) The GeneXpert MRSA Assay (FDA approved for NASAL specimens only), is one component of a comprehensive MRSA colonization surveillance program. It is not intended to diagnose MRSA infection nor  to guide or monitor treatment for MRSA infections. Test performance is not FDA approved in patients less than 24 years old. Performed at [facility], [place] [address], [place],  [address]     Labs: CBC: Recent Labs  Lab 11/29/21 0815 11/30/21 0653 12/02/21 0330  WBC 3.9* 2.2* 1.7*  NEUTROABS 3.3 1.5* 1.1*  HGB 8.5* 8.2* 8.0*  HCT 28.3* 27.6* 26.8*  MCV 107.2* 107.0* 105.5*  PLT 143* 139* 099*   Basic Metabolic Panel: Recent Labs  Lab 11/29/21 0815 11/30/21 0653 12/01/21 0545 12/02/21 0330  NA 134* 138 139 139  K 3.6 3.6 4.2 4.8  CL 104 107 108 105  CO2 23 23 26 28   GLUCOSE 99 64* 85 142*  BUN 10 17 23 23   CREATININE 0.55 0.46 0.65 0.78  CALCIUM 7.8* 8.2* 7.8* 8.1*  MG 1.5* 2.0 1.9 1.8  PHOS  --  3.7  --   --    Liver Function Tests: Recent Labs  Lab 11/29/21 0815 11/30/21 0653 12/01/21 0545 12/02/21 0330  AST 20 16 16 18   ALT 9 9 9 10   ALKPHOS 93 80 73 85  BILITOT 0.5 0.6 0.5 0.5  PROT 5.1* 5.0* 4.9* 5.2*  ALBUMIN 2.1* 1.9* 1.8* 2.0*   CBG:  Recent Labs  Lab 12/02/21 0319 12/02/21 0801 12/02/21 0833 12/02/21 0847 12/02/21 1138  GLUCAP 193* 53* 58* 81 126*    Discharge time spent: greater than 30 minutes.  Signed: Berle Mull, MD [facility] 12/03/2021

## 2021-12-04 ENCOUNTER — Telehealth: Payer: Self-pay | Admitting: *Deleted

## 2021-12-04 LAB — CULTURE, BLOOD (ROUTINE X 2)
Culture: NO GROWTH
Culture: NO GROWTH
Special Requests: ADEQUATE
Special Requests: ADEQUATE

## 2021-12-04 NOTE — Telephone Encounter (Addendum)
Daughter had left VM reporting blood w/clots when she disconnected the [employer] this morning. Unable to reach daughter, so called patient. Mrs. Cubbage reports hematuria started in hospital, but it did not seem to be of concern then.Reports blood in urine with several clots about size of end of your finger. No pain or burning with urination and no fever. UA on 12/22 showed Large amount of blood. Hgb 1/16 is 8.0 Currently on ASA 81 mg and Plavix 75 mg. She also reports increase in bilateral hip pain since she fell ~ 1.5 weeks ago. Did not bother her in hospital due to being in bed. Reports significant pain with ambulation and asking if xray is worthwhile? Per Dr. Benay Spice: She has cancer in her bladder as well, so bleeding is not unexpected. With bilateral hip pain it is doubtful she has fracture. IF pain does not improve reach out to PCP to work up. Patient notified and agrees. She will tell her daughter we returned call.

## 2021-12-05 ENCOUNTER — Telehealth: Payer: Self-pay

## 2021-12-05 NOTE — Telephone Encounter (Signed)
Called and spoke with [name] To informed her Per Dr. Benay Spice: the patient has cancer in her bladder as well, so bleeding is not unexpected. With bilateral hip pain it is doubtful she has fracture. IF pain does not improve reach out to PCP to work up. Patient's daughter voiced understanding

## 2021-12-09 ENCOUNTER — Encounter (HOSPITAL_COMMUNITY): Payer: Self-pay

## 2021-12-09 ENCOUNTER — Emergency Department (HOSPITAL_COMMUNITY)
Admission: EM | Admit: 2021-12-09 | Discharge: 2021-12-10 | Disposition: A | Discharge status: Home / Self Care | Payer: Medicare HMO | Attending: Emergency Medicine | Admitting: Emergency Medicine

## 2021-12-09 ENCOUNTER — Emergency Department (HOSPITAL_COMMUNITY): Payer: Medicare HMO

## 2021-12-09 ENCOUNTER — Other Ambulatory Visit: Payer: Self-pay

## 2021-12-09 DIAGNOSIS — Z96642 Presence of left artificial hip joint: Secondary | ICD-10-CM | POA: Diagnosis not present

## 2021-12-09 DIAGNOSIS — E119 Type 2 diabetes mellitus without complications: Secondary | ICD-10-CM | POA: Diagnosis not present

## 2021-12-09 DIAGNOSIS — Z7984 Long term (current) use of oral hypoglycemic drugs: Secondary | ICD-10-CM | POA: Diagnosis not present

## 2021-12-09 DIAGNOSIS — Z7902 Long term (current) use of antithrombotics/antiplatelets: Secondary | ICD-10-CM | POA: Insufficient documentation

## 2021-12-09 DIAGNOSIS — Z794 Long term (current) use of insulin: Secondary | ICD-10-CM | POA: Insufficient documentation

## 2021-12-09 DIAGNOSIS — M25552 Pain in left hip: Secondary | ICD-10-CM | POA: Diagnosis present

## 2021-12-09 DIAGNOSIS — Z7982 Long term (current) use of aspirin: Secondary | ICD-10-CM | POA: Diagnosis not present

## 2021-12-09 DIAGNOSIS — Z79899 Other long term (current) drug therapy: Secondary | ICD-10-CM | POA: Diagnosis not present

## 2021-12-09 MED ORDER — FENTANYL CITRATE PF 50 MCG/ML IJ SOSY
50.0000 ug | PREFILLED_SYRINGE | Freq: Once | INTRAMUSCULAR | Status: AC
  Administered 2021-12-09: 50 ug via INTRAMUSCULAR
  Filled 2021-12-09: qty 1

## 2021-12-09 MED ORDER — HYDROMORPHONE HCL 1 MG/ML IJ SOLN
0.5000 mg | Freq: Once | INTRAMUSCULAR | Status: AC
  Administered 2021-12-09: 0.5 mg via INTRAMUSCULAR
  Filled 2021-12-09: qty 1

## 2021-12-09 NOTE — ED Provider Notes (Signed)
[facility] DEPT Provider Note   CSN: [id number] Arrival date & time: 12/09/21  1945     History  Chief Complaint  Patient presents with   Hip Pain    [name] is a 76 y.o. female who presents to the ED today via EMS for complaint of worsening left hip pain.  Patient reports that about 2 weeks ago she fell backwards and landed on her hip.  She had an x-ray done at urgent care which was negative.  Her son is at bedside and states that she was not discharged with any pain medication.  He left her for a couple of hours tonight and when he returned he was notified by his nephew that patient called EMS due to worsening hip pain.  He is unsure if she is fallen recently.  She does have history of prosthetic hip to the left side.  She denies any head injury or loss of consciousness.  She has no other complaints.    The history is provided by the patient, medical records and a relative.      Home Medications Prior to Admission medications   Medication Sig Start Date End Date Taking? Authorizing Provider  ascorbic acid (VITAMIN C) 500 MG tablet Take 1 tablet (500 mg total) by mouth daily. 12/03/21   Lavina Hamman, MD  aspirin 81 MG EC tablet Take 1 tablet (81 mg total) by mouth daily. RESUME AFTER 1 WEEK Patient taking differently: Take 81 mg by mouth every morning. 09/03/21   Bonnielee Haff, MD  atorvastatin (LIPITOR) 40 MG tablet Take 40 mg by mouth every morning. 12/15/19   [provider]  baclofen (LIORESAL) 10 MG tablet Take 10 mg by mouth 4 (four) times daily as needed for muscle spasms. 03/22/21   [provider]  cephALEXin (KEFLEX) 250 MG capsule Take 1 capsule (250 mg total) by mouth daily. RESUME AFTER YOU HAVE COMPLETED THE COURSE OF CIPRO Patient taking differently: Take 250 mg by mouth every morning. 08/28/21   Bonnielee Haff, MD  clopidogrel (PLAVIX) 75 MG tablet Take 1 tablet (75 mg total) by mouth at bedtime. 10/12/21    Shawna Clamp, MD  docusate sodium (COLACE) 100 MG capsule Take 100 mg by mouth 2 (two) times daily.    [provider]  feeding supplement, GLUCERNA SHAKE, (GLUCERNA SHAKE) LIQD Take 237 mLs by mouth 3 (three) times daily between meals. 12/02/21   Lavina Hamman, MD  ferrous sulfate 325 (65 FE) MG tablet Take 1 tablet (325 mg total) by mouth 2 (two) times daily with a meal. 08/28/21 01/25/22  Bonnielee Haff, MD  furosemide (LASIX) 20 MG tablet Take 20 mg by mouth daily. 11/19/21   [provider]  gabapentin (NEURONTIN) 300 MG capsule Take 300-600 mg by mouth See admin instructions. Take one capsule (300 mg) by mouth every morning and two capsules (600 mg) at night    [provider]  gemfibrozil (LOPID) 600 MG tablet Take 600 mg by mouth 2 (two) times daily.    [provider]  insulin degludec (TRESIBA FLEXTOUCH) 100 UNIT/ML FlexTouch Pen Inject 10 Units into the skin daily. Patient taking differently: Inject 20 Units into the skin daily as needed (CBG >200). 08/28/21   Bonnielee Haff, MD  levothyroxine (SYNTHROID) 125 MCG tablet Take 1 tablet (125 mcg total) by mouth daily at 6 (six) AM. 08/29/21 01/25/22  Bonnielee Haff, MD  magnesium oxide (MAG-OX) 400 (240 Mg) MG tablet Take 1 tablet (  400 mg total) by mouth 2 (two) times daily. 11/26/21   Ladell Pier, MD  melatonin 5 MG TABS Take 1 tablet (5 mg total) by mouth at bedtime. 12/02/21   Lavina Hamman, MD  metFORMIN (GLUCOPHAGE) 500 MG tablet Take 500 mg by mouth 2 (two) times daily. 02/01/21   [provider]  Multiple Vitamin (MULTIVITAMIN WITH MINERALS) TABS tablet Take 1 tablet by mouth daily. 12/03/21   Lavina Hamman, MD  nystatin (MYCOSTATIN/NYSTOP) powder Apply 1 application topically 2 (two) times daily. To groin rash 07/29/21   Ladell Pier, MD  nystatin cream (MYCOSTATIN) Apply 1 application topically 2 (two) times daily as needed (groin rash). Mix with triamcinolone cream    [provider]  ondansetron (ZOFRAN) 8 MG tablet Take 1 tablet (8 mg total) by mouth every 6 (six) hours as needed for nausea or vomiting. 10/31/21   Esterwood, Amy S, PA-C  pantoprazole (PROTONIX) 40 MG tablet Take 1 tablet (40 mg total) by mouth 2 (two) times daily. 10/31/21   Esterwood, Amy S, PA-C  polyethylene glycol powder (GLYCOLAX/MIRALAX) 17 GM/SCOOP powder Take 17 g by mouth daily.    [provider]  potassium chloride SA (KLOR-CON) 20 MEQ tablet Take 1 tablet (20 mEq total) by mouth 2 (two) times daily. 10/03/21   Ladell Pier, MD  prochlorperazine (COMPAZINE) 10 MG tablet Take 10 mg by mouth every 6 (six) hours as needed for nausea or vomiting.    [provider]  sertraline (ZOLOFT) 25 MG tablet Take 25 mg by mouth daily. 11/20/21   [provider]  triamcinolone cream (KENALOG) 0.1 % Apply 1 application topically 2 (two) times daily as needed (groin rash). Mix with nystatin cream 09/19/20   [provider]  XTAMPZA ER 9 MG C12A Take 1 capsule by mouth 2 (two) times daily. 10/31/21   Esterwood, Amy S, PA-C  zinc sulfate 220 (50 Zn) MG capsule Take 1 capsule (220 mg total) by mouth daily. 12/03/21   Lavina Hamman, MD      Allergies    Codeine    Review of Systems   Review of Systems  Constitutional:  Negative for chills and fever.  Musculoskeletal:  Positive for arthralgias.  Neurological:  Negative for syncope and headaches.  All other systems reviewed and are negative.  Physical Exam Updated Vital Signs BP 131/71    Pulse 81    Temp 98.4 F (36.9 C)    Resp 18    Ht 5' (1.524 m)    Wt 72 kg    SpO2 96%    BMI 31.00 kg/m  Physical Exam Vitals and nursing note reviewed.  Constitutional:      Appearance: She is not ill-appearing or diaphoretic.  HENT:     Head: Normocephalic and atraumatic.  Eyes:     Conjunctiva/sclera: Conjunctivae normal.  Cardiovascular:     Rate and Rhythm: Normal rate and regular rhythm.  Pulmonary:      Effort: Pulmonary effort is normal.     Breath sounds: Normal breath sounds. No wheezing, rhonchi or rales.  Abdominal:     Palpations: Abdomen is soft.     Tenderness: There is no abdominal tenderness.     Comments: Colostomy bag in place to abdomen, leaking stool at this time.   Musculoskeletal:     Cervical back: Neck supple.     Comments: Questionable mild shortening of LLE compared to RLE. + TTP to left hip. ROM limited  s/2 pain. Sensation intact throughout. 2+ radial pulse.   Skin:    General: Skin is warm and dry.  Neurological:     Mental Status: She is alert.    ED Results / Procedures / Treatments   Labs (all labs ordered are listed, but only abnormal results are displayed) Labs Reviewed - No data to display  EKG None  Radiology DG Hip Unilat W or Wo Pelvis 2-3 Views Left  Result Date: 12/09/2021 CLINICAL DATA:  A 76 year old female presents 2 weeks following fall at home with LEFT posterior hip pain. EXAM: DG HIP (WITH OR WITHOUT PELVIS) 2-3V LEFT COMPARISON:  CT of the abdomen and pelvis from September of 2020. FINDINGS: Osteopenia. Mild incongruity of the medial wall the LEFT acetabulum is similar to prior imaging, mild irregularity underlying the LEFT acetabular component of LEFT total hip arthroplasty. No displaced fracture or new abnormality about the bony pelvis. Cerclage wires about the LEFT femoral component of the LEFT total hip arthroplasty. No visible fracture. No sign of dislocation. IMPRESSION: 1. Post LEFT hip arthroplasty with similar appearance of irregularity of the medial wall of the LEFT acetabulum. No visible fracture. 2. Osteopenia. Electronically Signed   By: Zetta Bills M.D.   On: 12/09/2021 20:35    Procedures Procedures    Medications Ordered in ED Medications  fentaNYL (SUBLIMAZE) injection 50 mcg (50 mcg Intramuscular Given 12/09/21 2148)    ED Course/ Medical Decision Making/ A&P                           Medical Decision  Making 76 year old female who presents to the ED today with complaint of worsening left hip pain that began tonight.  Recently had a follow-up however had a negative x-ray at urgent care.  On arrival to the ED today vitals are stable.  Patient had an x-ray done prior to being seen she does not show any obvious fracture findings.  On my exam her left lower extremity does appear slightly shortened compared to right lower extremity however difficult to assess whether this is baseline for patient with history of total hip replacement on the left side x2 in the past.  Is noted to be tender to palpation throughout the left hip.  Her range of motion is limited secondary to pain.  Will provide pain medication and plan for CT pelvis given negative x-ray with history of prosthesis in this area.  Incidentally patient is noted to have leaking colostomy bag, will change in the ED today.   Amount and/or Complexity of Data Reviewed Radiology: ordered.    Details: Xray negative  Risk Prescription drug management.   At shift change case signed out to [employer], PA-C, who will dispo accordingly.         Final Clinical Impression(s) / ED Diagnoses Final diagnoses:  None    Rx / DC Orders ED Discharge Orders     None         Eustaquio Maize, PA-C 12/09/21 2214    Hayden Rasmussen, MD 12/10/21 [phone]

## 2021-12-09 NOTE — ED Provider Notes (Signed)
Patient received at shift change from venter margaux PA please see note for full detail  Patient with medical history including status post left-sided hip replacements, type 2 diabetes presents to the ED for complaints of left-sided hip pain, fell about 2 weeks ago, went to primary care doctor where imaging was unremarkable, woke up today with worsening pain still able to ambulate.  Per previous provider follow-up on CT head if unremarkable can be discharged home.  Imaging reveals DG of left hip arthroplasty with similar appearance of irregularity of the medial wall of left acetabulum, CT hip is negative for acute findings.  Patient is reassessed, updated on imaging, patient states that she still having pain, I personally evaluated her hips, there is no pelvic instability, no leg shortening, no internal/external rotation present, neurovascular fully intact in lower extremities, she has full range of motion her toes, ankle, knee, as well as her hip, and is slightly limited due to pain.  Spine was also palpated was nontender to palpation, no step-off deformities present, there is no facial asymmetry, no difficult word finding,  follow two-step commands, no unilateral weakness present.  will provide her with a dose of pain medications and reassess.  Patient  reassessed states she is feeling better, son was at bedside and they state that they are ready for discharge, they have home PT and will follow up with PCP as needed, she is out of her narcotic medication we will give her 3 days of oxycodone.       Marcello Fennel, PA-C 12/10/21 4431    Ripley Fraise, MD 12/10/21 [phone]

## 2021-12-09 NOTE — ED Triage Notes (Signed)
Arrives EMS from home with c/o left hip pain x 2 weeks after a fall at home. Negative XR at urgent care per EMS. Pt says while sitting at home tonight pain began again. Ambulatory.

## 2021-12-10 MED ORDER — OXYCODONE-ACETAMINOPHEN 5-325 MG PO TABS
1.0000 | ORAL_TABLET | Freq: Three times a day (TID) | ORAL | 0 refills | Status: AC | PRN
Start: 2021-12-10 — End: 2021-12-13

## 2021-12-10 MED ORDER — OXYCODONE-ACETAMINOPHEN 5-325 MG PO TABS: 1.0000 | ORAL_TABLET | Freq: Three times a day (TID) | ORAL | 0 refills | Status: DC | PRN

## 2021-12-10 MED ORDER — HEPARIN SOD (PORK) LOCK FLUSH 100 UNIT/ML IV SOLN
500.0000 [IU] | Freq: Once | INTRAVENOUS | Status: AC
  Administered 2021-12-10: 01:00:00 500 [IU]
  Filled 2021-12-10: qty 5

## 2021-12-10 NOTE — Discharge Instructions (Addendum)
Likely you have bruised your left hip, I recommend over-the-counter pain medication as needed, recommend playing ice to the area this will decrease swelling inflammation.    I have given you a short course of narcotics please take as prescribed.  This medication can make you drowsy do not consume alcohol or operate heavy machinery when taking this medication.  This medication is Tylenol in it do not take Tylenol and take this medication.    Please follow-up with your PCP for further evaluation if symptoms not improved.  Come back to the emergency department if you develop chest pain, shortness of breath, severe abdominal pain, uncontrolled nausea, vomiting, diarrhea.

## 2021-12-13 ENCOUNTER — Inpatient Hospital Stay: Payer: Medicare HMO | Admitting: Licensed Clinical Social Worker

## 2021-12-17 ENCOUNTER — Other Ambulatory Visit: Payer: Self-pay | Admitting: Oncology

## 2021-12-17 DIAGNOSIS — C20 Malignant neoplasm of rectum: Secondary | ICD-10-CM

## 2021-12-18 ENCOUNTER — Inpatient Hospital Stay: Payer: Medicare HMO

## 2021-12-18 ENCOUNTER — Inpatient Hospital Stay: Payer: Medicare HMO | Admitting: Nurse Practitioner

## 2021-12-19 ENCOUNTER — Ambulatory Visit: Payer: Medicare HMO | Admitting: Podiatry

## 2021-12-23 ENCOUNTER — Telehealth: Payer: Self-pay | Admitting: Oncology

## 2021-12-23 ENCOUNTER — Inpatient Hospital Stay: Payer: Medicare HMO

## 2021-12-23 ENCOUNTER — Inpatient Hospital Stay: Payer: Medicare HMO | Admitting: Nurse Practitioner

## 2021-12-25 ENCOUNTER — Other Ambulatory Visit: Payer: Self-pay

## 2021-12-25 ENCOUNTER — Encounter: Payer: Self-pay | Admitting: Podiatry

## 2021-12-25 ENCOUNTER — Ambulatory Visit: Payer: Medicare HMO | Admitting: Podiatry

## 2021-12-25 DIAGNOSIS — M79675 Pain in left toe(s): Secondary | ICD-10-CM | POA: Diagnosis not present

## 2021-12-25 DIAGNOSIS — B351 Tinea unguium: Secondary | ICD-10-CM | POA: Diagnosis not present

## 2021-12-25 DIAGNOSIS — M79674 Pain in right toe(s): Secondary | ICD-10-CM

## 2021-12-25 DIAGNOSIS — E1142 Type 2 diabetes mellitus with diabetic polyneuropathy: Secondary | ICD-10-CM

## 2021-12-25 DIAGNOSIS — L84 Corns and callosities: Secondary | ICD-10-CM

## 2021-12-25 NOTE — Progress Notes (Signed)
°  Subjective:  Patient ID: [name], female    DOB: 1946-05-14,   MRN: [id number]  No chief complaint on file.    76 y.o. female presents for follow-up of a right foot wound.  She relates she is doing well and has been applying iodosorb to the wound and a bandaid. Thinks its healed.  Denies any pain. Marland Kitchen Has not been using surgical shoe. Requesting nail trim. Last A1c was  7.7. Denies any other pedal complaints. Denies n/v/f/c.   PCP: Everardo Beals NP   Past Medical History:  Diagnosis Date   Arthritis    Basal ganglia stroke ([place])    Bilateral lower extremity edema    Cancer ([place])    Carotid artery occlusion    Flank pain    Full dentures    History of cervical cancer    11/ 1994  Stage IB  s/p  high dose radiation brachytherapy @ [facility] 01/ 1995--- per pt no recurrence   History of chronic bronchitis    History of hyperthyroidism    d 03/ 2011---due to grave's disease--- s/p  RAI treatment 04/ 2011   History of kidney stones    History of sepsis 05/03/2018   due to UTI with klebsiella/ pyelonephritis/ ureteral obstruction cause by stone   Hyperlipidemia    Hypothyroidism, postradioiodine therapy    endocrinologist--  dr Loanne Drilling--  dx graves disease and s/p RAI i131 treatement 4/ 2011   Nauseated    Oral thrush 05/03/2018   Pneumonia    Type 2 diabetes mellitus treated with insulin ([place])    FOLLOWED BY PCP   Urgency of urination    Wears glasses     Objective:  Physical Exam: Vascular: DP/PT pulses 2/4 bilateral. CFT <3 seconds. Normal hair growth on digits. No edema.  Skin. No lacerations or abrasions bilateral feet. Right hallux wound healed with overlying hyperkeratosis. .  Hyperkeratotic lesion noted to sub first metatarsal right and plantar left hallux. Pre-ulcerative.  Edema noted to right lower extremity with mild erythema present on dorsal foot. Nails 1-5 are thickened discolored and elongated with subungual debris.  Musculoskeletal: MMT 5/5 bilateral lower  extremities in DF, PF, Inversion and Eversion. Deceased ROM in DF of ankle joint.  Neurological: Sensation intact to light touch.   Assessment:   1. Pain due to onychomycosis of toenails of both feet   2. Diabetic peripheral neuropathy associated with type 2 diabetes mellitus ([place])   3. Pre-ulcerative calluses        Plan:  Patient was evaluated and treated and all questions answered. Ulcer right plantar first metatarsal limited to breakdown of skin, nearly healed.  -Pre-ulcerative calluses debrided as well. Advised to keep padding around these areas.  -No abx necessary  -Discussed and educated patient on diabetic foot care, especially with  regards to the vascular, neurological and musculoskeletal systems.  -Stressed the importance of good glycemic control and the detriment of not  controlling glucose levels in relation to the foot. -Discussed supportive shoes at all times and checking feet regularly.  -Mechanically debrided all nails 1-5 bilateral using sterile nail nipper and filed with dremel without incident  -Answered all patient questions -Patient to return  in 3 months for at risk foot care -Patient advised to call the office if any problems or questions arise in the meantime.     No follow-ups on file.   [name], DPM

## 2021-12-26 ENCOUNTER — Other Ambulatory Visit: Payer: Medicare HMO

## 2021-12-26 ENCOUNTER — Inpatient Hospital Stay: Payer: Medicare HMO

## 2021-12-26 ENCOUNTER — Ambulatory Visit: Payer: Medicare HMO | Admitting: Oncology

## 2021-12-31 ENCOUNTER — Inpatient Hospital Stay: Payer: Medicare HMO

## 2021-12-31 ENCOUNTER — Inpatient Hospital Stay: Payer: Medicare HMO | Attending: Nurse Practitioner

## 2021-12-31 ENCOUNTER — Encounter: Payer: Self-pay | Admitting: Nurse Practitioner

## 2021-12-31 ENCOUNTER — Other Ambulatory Visit: Payer: Self-pay

## 2021-12-31 ENCOUNTER — Inpatient Hospital Stay: Payer: Medicare HMO | Admitting: Nurse Practitioner

## 2021-12-31 VITALS — BP 136/58 | HR 66 | Temp 98.0°F | Resp 18 | Ht 60.0 in | Wt 136.0 lb

## 2021-12-31 VITALS — BP 119/54 | HR 50

## 2021-12-31 DIAGNOSIS — Z95828 Presence of other vascular implants and grafts: Secondary | ICD-10-CM

## 2021-12-31 DIAGNOSIS — C20 Malignant neoplasm of rectum: Secondary | ICD-10-CM

## 2021-12-31 LAB — CMP (CANCER CENTER ONLY)
ALT: <10 U/L (ref 0–44)
AST: 11 U/L — ABNORMAL LOW (ref 15–41)
Albumin: 3 g/dL — ABNORMAL LOW (ref 3.5–5.0)
Alkaline Phosphatase: 145 U/L — ABNORMAL HIGH (ref 38–126)
Anion gap: 9 (ref 5–15)
BUN: 13 mg/dL (ref 8–23)
CO2: 25 mmol/L (ref 22–32)
Calcium: 8.3 mg/dL — ABNORMAL LOW (ref 8.9–10.3)
Chloride: 109 mmol/L (ref 98–111)
Creatinine: 0.68 mg/dL (ref 0.44–1.00)
GFR, Estimated: >60 mL/min (ref >60)
Glucose, Bld: 113 mg/dL — ABNORMAL HIGH (ref 70–99)
Potassium: 4.3 mmol/L (ref 3.5–5.1)
Sodium: 143 mmol/L (ref 135–145)
Total Bilirubin: 0.3 mg/dL (ref 0.3–1.2)
Total Protein: 5.9 g/dL — ABNORMAL LOW (ref 6.5–8.1)

## 2021-12-31 LAB — CBC WITH DIFFERENTIAL (CANCER CENTER ONLY)
Abs Immature Granulocytes: 0.01 10*3/uL (ref 0.00–0.07)
Basophils Absolute: 0 10*3/uL (ref 0.0–0.1)
Basophils Relative: 1 %
Eosinophils Absolute: 0.1 10*3/uL (ref 0.0–0.5)
Eosinophils Relative: 2 %
HCT: 31.9 % — ABNORMAL LOW (ref 36.0–46.0)
Hemoglobin: 9.6 g/dL — ABNORMAL LOW (ref 12.0–15.0)
Immature Granulocytes: 0 %
Lymphocytes Relative: 16 %
Lymphs Abs: 0.9 10*3/uL (ref 0.7–4.0)
MCH: 29.7 pg (ref 26.0–34.0)
MCHC: 30.1 g/dL (ref 30.0–36.0)
MCV: 98.8 fL (ref 80.0–100.0)
Monocytes Absolute: 0.4 10*3/uL (ref 0.1–1.0)
Monocytes Relative: 7 %
Neutro Abs: 4 10*3/uL (ref 1.7–7.7)
Neutrophils Relative %: 74 %
Platelet Count: 221 10*3/uL (ref 150–400)
RBC: 3.23 MIL/uL — ABNORMAL LOW (ref 3.87–5.11)
RDW: 14 % (ref 11.5–15.5)
WBC Count: 5.4 10*3/uL (ref 4.0–10.5)
nRBC: 0 % (ref 0.0–0.2)

## 2021-12-31 LAB — MAGNESIUM: Magnesium: 1.1 mg/dL — ABNORMAL LOW (ref 1.7–2.4)

## 2021-12-31 MED ORDER — SODIUM CHLORIDE 0.9% FLUSH
10.0000 mL | Freq: Once | INTRAVENOUS | Status: AC
  Administered 2021-12-31: 10 mL via INTRAVENOUS

## 2021-12-31 MED ORDER — SODIUM CHLORIDE 0.9 % IV SOLN: Freq: Once | INTRAVENOUS | Status: AC

## 2021-12-31 MED ORDER — HEPARIN SOD (PORK) LOCK FLUSH 100 UNIT/ML IV SOLN
500.0000 [IU] | Freq: Once | INTRAVENOUS | Status: AC
  Administered 2021-12-31: 500 [IU] via INTRAVENOUS

## 2021-12-31 MED ORDER — MAGNESIUM SULFATE 4 GM/100ML IV SOLN
4.0000 g | Freq: Once | INTRAVENOUS | Status: AC
  Administered 2021-12-31: 4 g via INTRAVENOUS
  Filled 2021-12-31: qty 100

## 2021-12-31 NOTE — Patient Instructions (Signed)

## 2021-12-31 NOTE — Progress Notes (Signed)
[facility] OFFICE PROGRESS NOTE   Diagnosis: Rectal cancer, cervical cancer  INTERVAL HISTORY:   [name] returns as scheduled.  She is accompanied by her son.  She was hospitalized 11/29/2021 - 12/02/2021 with COVID-pneumonia.  She was seen in the emergency department 12/09/2021 with left hip pain.  CT showed no acute findings.  She is working with physical therapy.  She reports having a good appetite.  She continues to have rectal bleeding.  She feels this has increased.  Colostomy is functioning normally.  Left hip pain is better.  Objective:  Vital signs in last 24 hours:  Blood pressure (!) 136/58, pulse 66, temperature 98 F (36.7 C), temperature source Oral, resp. rate 18, height 5' (1.524 m), weight 136 lb (61.7 kg), SpO2 95 %.    HEENT: No thrush or ulcers. Resp: Lungs clear bilaterally. Cardio: Regular rate and rhythm. GI: Abdomen soft and nontender.  No hepatomegaly.  Left lower quadrant colostomy. Vascular: Trace edema lower leg bilaterally right greater than left. Neuro: Alert and oriented. Port-A-Cath without erythema.   Lab Results:  Lab Results  Component Value Date   WBC 5.4 12/31/2021   HGB 9.6 (L) 12/31/2021   HCT 31.9 (L) 12/31/2021   MCV 98.8 12/31/2021   PLT 221 12/31/2021   NEUTROABS 4.0 12/31/2021    Imaging:  No results found.  Medications: I have reviewed the patient's current medications.  Assessment/Plan: Rectal cancer Partially obstructing mass beginning at 10 cm from the anal verge on colonoscopy 01/23/2021, biopsy-adenocarcinoma, immunohistochemical stains at [facility]-CK20, CDX-2, and SATB2 positive, patchy strong staining for p16.  The rectal tumor has a distinct immunohistochemical profile suggesting a primary colorectal tumor as opposed to metastatic cervical cancer. CT abdomen/pelvis 01/18/2021-bilateral hydronephrosis, endometrial fluid collection, prominent stool in the colon MRI pelvis 01/23/2021-tumor at 7.6 cm from the  anal verge, abnormal signal bridge in the cervix, mesorectum, and anterior aspect of the rectum-similar to MRI from 2020, MRI stage could not be defined secondary to posttreatment changes in the pelvis 03/18/2021-total abdominal hysterectomy, cystoscopy with insertion of ureteral stents, exploratory laparotomy, aborted low anterior resection, descending loop colostomy--right and left fallopian tubes and ovaries negative for malignancy; excision portion of cervix positive for adenocarcinoma; uterus with invasive moderately differentiated adenocarcinoma of the cervix, HPV associated, invading through full-thickness of the cervix/lower uterine segment and into parametrial tissue, margins of resection received disrupted, cannot be evaluated; remaining uterus with extensive endocervical adenocarcinoma in situ involving lower uterine segment and replacing endometrium.  This carcinoma was felt to be a separate primary from the rectal tumor. PET scan at [facility] 04/11/2021-intense FDG activity in region of known rectal mass.  Hypermetabolic left external iliac and right inguinal lymph nodes.  Minimal FDG uptake and small right supraclavicular lymph node.  No uptake seen in the cervix. 04/23/2021 FNA right inguinal lymph node-negative for malignancy Evaluated by Dr. Leamon Arnt 05/21/2021, 05/28/2021-appears to have 2 synchronous malignancies (cervical adenocarcinoma and rectal adenocarcinoma); further surgery which would entail pelvic exenteration not recommended by gynecologic oncology or colorectal surgery.  Full doses of radiotherapy not felt to be feasible.  Recommended 3 months of CAPOX and then repeat MRI of abdomen/pelvis, chest CT and colonoscopy. Cycle 1 Xeloda 06/13/2021 Cycle 1 FOLFOX 07/08/2021, oxaliplatin dose reduced secondary to pre-existing neuropathy Cycle 2 FOLFOX 07/29/2021, Udenyca Cycle 3 FOLFOX 08/12/2021, Udenyca Cycle 4 FOLFOX 09/02/2021, Udenyca Cycle 5 FOLFOX 09/17/2021, Udenyca CTs at [facility]  10/01/2021-unchanged findings of rectal malignancy status posttreatment.  Marked thickening of the proximal duodenal  wall.  Unchanged bilateral inguinal lymph nodes which are not enlarged but avid on prior PET.  Unchanged diffuse bladder wall thickening and perivesicular stranding which may represent radiation cystitis.  New mild right lower lobe bronchial wall thickening, mucus impaction and tree-in-bud pulmonary nodules. Cycle 6 FOLFOX 10/22/2021, Udenyca     Bilateral hydronephrosis-secondary to urinary retention? Hematometra found on pelvic MRI 01/23/2021, evaluated by gynecologic oncology Cervical cancer November 1994, stage Ib, treated with external beam radiation and brachytherapy at [facility] Diabetes Neuropathy Chronic pain secondary to #6 Recurrent urinary tract infections History of a CVA History of hyperthyroidism G3, P3 COVID-19 infection January 2021 Admission 08/25/2021 with a urinary tract infection-culture negative Anemia secondary to chronic disease, chemotherapy, and rectal bleeding-1 unit packed red blood cells 08/26/2021 Hospital admission 09/23/2021-symptomatic anemia 16.  Admission 10/08/2021 with altered mental status, nausea/vomiting, and left foot ulcers Urine culture 10/08/2021 positive for Pseudomonas aeruginosa EGD 10/12/2021-gastric and duodenal ulcers-not bleeding 17.  Admission 11/29/2021 through 12/02/2021 with COVID-pneumonia      Disposition: [name] appears unchanged.  She continues to have a borderline performance status.  She and her son understand she is not a candidate for additional systemic therapy in her current condition.  [name] would like to know the current status of her cancer.  We are referring her for restaging CT scans in the next 3 to 4 weeks.  She will return for an office visit a few days later to review the results.  We reviewed the labs from today.  She has hypomagnesemia.  She will receive IV magnesium today.  She will increase magnesium at  home from twice daily to 3 times daily.  Patient seen with Dr. Benay Spice.    Ned Card ANP/GNP-BC   12/31/2021  12:29 PM This was a shared visit with Ned Card.  [name] was interviewed and examined.  Her overall clinical status appears improved since discontinuing chemotherapy.  She would like to proceed with a restaging evaluation.  I was present for greater than 50% of today's visit.  I performed medical decision making.  Julieanne Manson, MD

## 2021-12-31 NOTE — Patient Instructions (Signed)
Magnesium Sulfate Injection What is this medication? MAGNESIUM SULFATE (mag NEE zee um SUL fate) prevents and treats low levels of magnesium in your body. It may also be used to prevent and treat seizures during pregnancy in people with high blood pressure disorders, such as preeclampsia or eclampsia. Magnesium plays an important role in maintaining the health of your muscles and nervous system. This medicine may be used for other purposes; ask your health care provider or pharmacist if you have questions. What should I tell my care team before I take this medication? They need to know if you have any of these conditions: Heart disease History of irregular heart beat Kidney disease An unusual or allergic reaction to magnesium sulfate, medications, foods, dyes, or preservatives Pregnant or trying to get pregnant Breast-feeding How should I use this medication? This medication is for infusion into a vein. It is given in a hospital or clinic setting. Talk to your care team about the use of this medication in children. While this medication may be prescribed for selected conditions, precautions do apply. Overdosage: If you think you have taken too much of this medicine contact a poison control center or emergency room at once. NOTE: This medicine is only for you. Do not share this medicine with others. What if I miss a dose? This does not apply. What may interact with this medication? Certain medications for anxiety or sleep Certain medications for seizures like phenobarbital Digoxin Medications that relax muscles for surgery Narcotic medications for pain This list may not describe all possible interactions. Give your health care provider a list of all the medicines, herbs, non-prescription drugs, or dietary supplements you use. Also tell them if you smoke, drink alcohol, or use illegal drugs. Some items may interact with your medicine. What should I watch for while using this medication? Your  condition will be monitored carefully while you are receiving this medication. You may need blood work done while you are receiving this medication. What side effects may I notice from receiving this medication? Side effects that you should report to your care team as soon as possible: Allergic reactions--skin rash, itching, hives, swelling of the face, lips, tongue, or throat High magnesium level--confusion, drowsiness, facial flushing, redness, sweating, muscle weakness, fast or irregular heartbeat, trouble breathing Low blood pressure--dizziness, feeling faint or lightheaded, blurry vision Side effects that usually do not require medical attention (report to your care team if they continue or are bothersome): Headache Nausea This list may not describe all possible side effects. Call your doctor for medical advice about side effects. You may report side effects to FDA at 1-800-FDA-1088. Where should I keep my medication? This medication is given in a hospital or clinic and will not be stored at home. NOTE: This sheet is a summary. It may not cover all possible information. If you have questions about this medicine, talk to your doctor, pharmacist, or health care provider.  2022 Elsevier/Gold Standard (2021-01-17 00:00:00)  

## 2022-01-01 ENCOUNTER — Encounter: Payer: Self-pay | Admitting: Oncology

## 2022-01-01 ENCOUNTER — Telehealth: Payer: Self-pay | Admitting: *Deleted

## 2022-01-01 NOTE — Telephone Encounter (Signed)
Provided daughter CT scan information for 01/21/22. Scan at 1130 with 1115 arrival. NPO 4 hours prior and oral contrast at 0930 and 1030. Confirmed she has the contrast.

## 2022-01-01 NOTE — Telephone Encounter (Signed)
daughter cancel appointment for 2/3 r/s for 2/9 patient came in today apointment change to 12/31/21 wanted to see Dr. Benay Spice   Incoming contact (In Person)

## 2022-01-07 ENCOUNTER — Encounter: Payer: Self-pay | Admitting: Certified Registered Nurse Anesthetist

## 2022-01-08 ENCOUNTER — Encounter: Payer: Self-pay | Admitting: Gastroenterology

## 2022-01-08 ENCOUNTER — Ambulatory Visit (AMBULATORY_SURGERY_CENTER): Payer: Medicare HMO | Admitting: Gastroenterology

## 2022-01-08 ENCOUNTER — Other Ambulatory Visit: Payer: Self-pay

## 2022-01-08 ENCOUNTER — Encounter: Payer: Self-pay | Admitting: Oncology

## 2022-01-08 VITALS — BP 140/61 | HR 79 | Temp 97.8°F | Resp 16 | Ht 60.0 in | Wt 124.0 lb

## 2022-01-08 DIAGNOSIS — Z8719 Personal history of other diseases of the digestive system: Secondary | ICD-10-CM | POA: Diagnosis present

## 2022-01-08 DIAGNOSIS — K2970 Gastritis, unspecified, without bleeding: Secondary | ICD-10-CM | POA: Diagnosis not present

## 2022-01-08 DIAGNOSIS — K259 Gastric ulcer, unspecified as acute or chronic, without hemorrhage or perforation: Secondary | ICD-10-CM

## 2022-01-08 DIAGNOSIS — K2951 Unspecified chronic gastritis with bleeding: Secondary | ICD-10-CM | POA: Diagnosis not present

## 2022-01-08 DIAGNOSIS — K2981 Duodenitis with bleeding: Secondary | ICD-10-CM | POA: Diagnosis not present

## 2022-01-08 DIAGNOSIS — K250 Acute gastric ulcer with hemorrhage: Secondary | ICD-10-CM

## 2022-01-08 DIAGNOSIS — K2980 Duodenitis without bleeding: Secondary | ICD-10-CM | POA: Diagnosis not present

## 2022-01-08 MED ORDER — DEXTROSE 5 % IV SOLN: INTRAVENOUS | Status: DC

## 2022-01-08 MED ORDER — DEXTROSE 50 % IV SOLN
1.0000 | Freq: Once | INTRAVENOUS | Status: AC
  Administered 2022-01-08: 50 mL via INTRAVENOUS

## 2022-01-08 MED ORDER — SODIUM CHLORIDE 0.9 % IV SOLN: 500.0000 mL | Freq: Once | INTRAVENOUS | Status: DC

## 2022-01-08 NOTE — Progress Notes (Signed)
[facility]  Per [facility] note 10-31-21, Plavix is not to be held for EGD.  Last dose 01-07-21  800-Pt at [facility], in wheelchair.  She states she normally is able to ambulate with her walker, but feeling "extra weak and shaky."  Blood sugar- 49. She took her Metformin yesterday, but hasn't taken her Tyler Aas "in I don't know how long."  815- D 50% 25 mL IV given over 5 minutes per Christell Constant RN  835- blood sugar 71.  Pt states she "feels better but still shaky.  840- D50% 25 mL IV over 5 minutes given.  Dr. Tarri Glenn and Elizabeth Palau CRNA made aware that we are following hypoglycemia protocol  900- blood sugar- 80.  Pt denies "feeling shaky, I feel good now."  Dr. Tarri Glenn and Georgina Snell aware

## 2022-01-08 NOTE — Progress Notes (Signed)
0935 Ephedrine 10 mg given IV due to low BP, MD updated.  

## 2022-01-08 NOTE — Progress Notes (Signed)
Called to room to assist during endoscopic procedure.  Patient ID and intended procedure confirmed with present staff. Received instructions for my participation in the procedure from the performing physician.  

## 2022-01-08 NOTE — Op Note (Signed)
[facility] Patient Name: [name] Procedure Date: 01/08/2022 9:10 AM MRN: [id number] Endoscopist: Thornton Park MD, MD Age: 76 Referring MD:  Date of Birth: 03/11/1946 Gender: Female Account #: [id number] Procedure:                Upper GI endoscopy Indications:              Follow-up of peptic ulcer                           Gastric ulcers and duodenal ulcer on EGD 10/12/21 Medicines:                Monitored Anesthesia Care Procedure:                Pre-Anesthesia Assessment:                           - Prior to the procedure, a History and Physical                            was performed, and patient medications and                            allergies were reviewed. The patient's tolerance of                            previous anesthesia was also reviewed. The risks                            and benefits of the procedure and the sedation                            options and risks were discussed with the patient.                            All questions were answered, and informed consent                            was obtained. Prior Anticoagulants: The patient has                            taken Plavix (clopidogrel), last dose was 1 day                            prior to procedure. ASA Grade Assessment: III - A                            patient with severe systemic disease. After                            reviewing the risks and benefits, the patient was                            deemed in satisfactory condition to undergo the  procedure.                           After obtaining informed consent, the endoscope was                            passed under direct vision. Throughout the                            procedure, the patient's blood pressure, pulse, and                            oxygen saturations were monitored continuously. The                            Endoscope was introduced through the mouth, and                             advanced to the third part of duodenum. The upper                            GI endoscopy was accomplished without difficulty.                            The patient tolerated the procedure well. Scope In: Scope Out: Findings:                 The esophagus was normal. The z-line is located 35                            cm from the incisors.                           Patchy mildly erythematous mucosa without bleeding                            was found in the gastric body and in the gastric                            antrum. Biopsies were taken from the antrum, body,                            and fundus with a cold forceps for histology.                            Estimated blood loss was minimal.                           Diffuse mild mucosal changes characterized by                            atrophy were found in the entire duodenum. Biopsies                            were taken with  a cold forceps for histology.                            Estimated blood loss was minimal.                           The cardia and gastric fundus were normal on                            retroflexion. Complications:            No immediate complications. Estimated blood loss:                            Minimal. Estimated Blood Loss:     Estimated blood loss was minimal. Impression:               - Normal esophagus.                           - Resolution of gastric ulcers and duodenal ulcer.                           - Erythematous mucosa in the gastric body and                            antrum. Biopsied.                           - Mucosal changes in the duodenum. Biopsied. Recommendation:           - Patient has a contact number available for                            emergencies. The signs and symptoms of potential                            delayed complications were discussed with the                            patient. Return to normal activities tomorrow.                             Written discharge instructions were provided to the                            patient.                           - Resume previous diet.                           - Continue present medications including                            pantoprazole 40 mg BID. After 8 additional weeks of  treatment, reduce pantoprazole to 40 mg every                            morning.                           - Await pathology results.                           - No aspirin, ibuprofen, naproxen, or other                            non-steroidal anti-inflammatory drugs. Thornton Park MD, MD 01/08/2022 9:42:02 AM This report has been signed electronically.

## 2022-01-08 NOTE — Patient Instructions (Signed)
Resume previous diet and medications Continue Protonix twice daily, after 8 weeks of additional treatment reduce to taking only every morning. Awaiting pathology results No aspirin, ibuprofen, naproxen, or other non-steroidal anti-inflammatory drugs.  YOU HAD AN ENDOSCOPIC PROCEDURE TODAY AT [facility]:   Refer to the procedure report that was given to you for any specific questions about what was found during the examination.  If the procedure report does not answer your questions, please call your gastroenterologist to clarify.  If you requested that your care partner not be given the details of your procedure findings, then the procedure report has been included in a sealed envelope for you to review at your convenience later.  YOU SHOULD EXPECT: Some feelings of bloating in the abdomen. Passage of more gas than usual.  Walking can help get rid of the air that was put into your GI tract during the procedure and reduce the bloating. If you had a lower endoscopy (such as a colonoscopy or flexible sigmoidoscopy) you may notice spotting of blood in your stool or on the toilet paper. If you underwent a bowel prep for your procedure, you may not have a normal bowel movement for a few days.  Please Note:  You might notice some irritation and congestion in your nose or some drainage.  This is from the oxygen used during your procedure.  There is no need for concern and it should clear up in a day or so.  SYMPTOMS TO REPORT IMMEDIATELY:  Following upper endoscopy (EGD)  Vomiting of blood or coffee ground material  New chest pain or pain under the shoulder blades  Painful or persistently difficult swallowing  New shortness of breath  Fever of 100F or higher  Black, tarry-looking stools  For urgent or emergent issues, a gastroenterologist can be reached at any hour by calling [phone]. Do not use MyChart messaging for urgent concerns.    DIET:  We do recommend a small meal  at first, but then you may proceed to your regular diet.  Drink plenty of fluids but you should avoid alcoholic beverages for 24 hours.  ACTIVITY:  You should plan to take it easy for the rest of today and you should NOT DRIVE or use heavy machinery until tomorrow (because of the sedation medicines used during the test).    FOLLOW UP: Our staff will call the number listed on your records 48-72 hours following your procedure to check on you and address any questions or concerns that you may have regarding the information given to you following your procedure. If we do not reach you, we will leave a message.  We will attempt to reach you two times.  During this call, we will ask if you have developed any symptoms of COVID 19. If you develop any symptoms (ie: fever, flu-like symptoms, shortness of breath, cough etc.) before then, please call [phone].  If you test positive for Covid 19 in the 2 weeks post procedure, please call and report this information to [place].    If any biopsies were taken you will be contacted by phone or by letter within the next 1-3 weeks.  Please call us at [phone] if you have not heard about the biopsies in 3 weeks.    SIGNATURES/CONFIDENTIALITY: You and/or your care partner have signed paperwork which will be entered into your electronic medical record.  These signatures attest to the fact that that the information above on your After Visit Summary has been reviewed  and is understood.  Full responsibility of the confidentiality of this discharge information lies with you and/or your care-partner.

## 2022-01-08 NOTE — Progress Notes (Signed)
Report given to PACU, vss 

## 2022-01-08 NOTE — Progress Notes (Signed)
0926 Robinul 0.1 mg IV given due large amount of secretions upon assessment.  MD made aware, vss

## 2022-01-08 NOTE — Progress Notes (Signed)
Referring Provider: Everardo Beals, NP Primary Care Physician:  Everardo Beals, NP  Reason for Procedure:  Gastric ulcer with persistent symptoms   IMPRESSION:  Gastric ulcer with persistent symptoms Appropriate candidate for monitored anesthesia care  PLAN: EGD in the [facility] today   HPI: [name] is a 76 y.o. female presents for follow-up EGD. Recent hospitalization for Pseudomonas urinary tract infection. Had an EGD because of the nausea and vomiting and abnormal CT findings as outlined above of duodenal wall thickening.  She was found to have three nonbleeding gastric and duodenal ulcers, largest was a 10 mm cratered duodenal ulcer and grade a esophagitis.   Biopsies negative for malignancy.   She has been on twice daily PPI and Zofran as needed.  Seen 10/31/21 with ongoing abdominal pain and frequent nausea and vomiting. EGD recommended.    Past Medical History:  Diagnosis Date   Arthritis    Basal ganglia stroke [facility])    March 2022   Bilateral lower extremity edema    Cancer ([place])    twice- colon cancer, unsure of the other type of cancer   Carotid artery occlusion    Flank pain    Full dentures    History of cervical cancer    11/ 1994  Stage IB  s/p  high dose radiation brachytherapy @ [facility] 01/ 1995--- per pt no recurrence   History of chronic bronchitis    History of hyperthyroidism    d 03/ 2011---due to grave's disease--- s/p  RAI treatment 04/ 2011   History of kidney stones    History of sepsis 05/03/2018   due to UTI with klebsiella/ pyelonephritis/ ureteral obstruction cause by stone   Hyperlipidemia    Hypothyroidism, postradioiodine therapy    endocrinologist--  dr Loanne Drilling--  dx graves disease and s/p RAI i131 treatement 4/ 2011   Nauseated    Oral thrush 05/03/2018   Pneumonia    Type 2 diabetes mellitus treated with insulin ([place])    FOLLOWED BY PCP   Urgency of urination    Wears glasses     Past Surgical History:  Procedure  Laterality Date   APPENDECTOMY     BIOPSY  01/23/2021   Procedure: BIOPSY;  Surgeon: Mauri Pole, MD;  Location: [facility];  Service: Endoscopy;;   BIOPSY  10/12/2021   Procedure: BIOPSY;  Surgeon: Thornton Park, MD;  Location: [facility];  Service: Gastroenterology;;   CAROTID ENDARTERECTOMY Right 03/28/2020   CATARACT EXTRACTION W/ INTRAOCULAR LENS  IMPLANT, BILATERAL  2004  approx.   COLONOSCOPY     COLONOSCOPY WITH PROPOFOL N/A 01/23/2021   Procedure: COLONOSCOPY WITH PROPOFOL;  Surgeon: Mauri Pole, MD;  Location: [facility];  Service: Endoscopy;  Laterality: N/A;   CYSTOSCOPY WITH STENT PLACEMENT Right 05/04/2018   Procedure: CYSTOSCOPY WITH STENT PLACEMENT AND RETROGRADE PYELOGRAM;  Surgeon: Ceasar Mons, MD;  Location: [facility];  Service: Urology;  Laterality: Right;   CYSTOSCOPY/URETEROSCOPY/HOLMIUM LASER/STENT PLACEMENT Right 05/19/2018   Procedure: CYSTOSCOPY, URETEROSCOPY/HOLMIUM LASER, STONE BASKETRY/ STENT EXCHANGE;  Surgeon: Ceasar Mons, MD;  Location: [facility];  Service: Urology;  Laterality: Right;   ENDARTERECTOMY Right 03/28/2020   Procedure: RIGHT CAROTID ENDARTERECTOMY with PATCH ANGIOPLASTY;  Surgeon: Rosetta Posner, MD;  Location: [facility];  Service: Vascular;  Laterality: Right;   ESOPHAGOGASTRODUODENOSCOPY (EGD) WITH PROPOFOL N/A 10/12/2021   Procedure: ESOPHAGOGASTRODUODENOSCOPY (EGD) WITH PROPOFOL;  Surgeon: Thornton Park, MD;  Location: [facility];  Service: Gastroenterology;  Laterality: N/A;  EXCISION MASS LEFT CHEST WALL  09-20-2007   dr Grandville Silos  [facility]   Radioactive Iodine Therapy     for thyroid   REVISION TOTAL HIP ARTHROPLASTY Left early 2000s   SUBMUCOSAL TATTOO INJECTION  01/23/2021   Procedure: SUBMUCOSAL TATTOO INJECTION;  Surgeon: Mauri Pole, MD;  Location: [facility];  Service: Endoscopy;;   TANDEM RING INSERTION  1995   dr Aldean Ast @ [facility]   EUA w/ tandem  placement in ovid  (for direct high dose radiation brachytherapy , cervical cancer)   TOTAL HIP ARTHROPLASTY Left 1990s   TRANSTHORACIC ECHOCARDIOGRAM  08/07/2017   mild focal basal hypertrophy of the septum,  ef 78-29%, grade 1 diastolic dysfunction/  atrial septum with lipomatous hypertrophy/  trivial PR   UPPER GASTROINTESTINAL ENDOSCOPY      Current Outpatient Medications  Medication Sig Dispense Refill   ascorbic acid (VITAMIN C) 500 MG tablet Take 1 tablet (500 mg total) by mouth daily. 30 tablet 0   atorvastatin (LIPITOR) 40 MG tablet Take 40 mg by mouth every morning.     baclofen (LIORESAL) 10 MG tablet Take 10 mg by mouth 4 (four) times daily as needed for muscle spasms.     feeding supplement, GLUCERNA SHAKE, (GLUCERNA SHAKE) LIQD Take 237 mLs by mouth 3 (three) times daily between meals. 10000 mL 0   ferrous sulfate 325 (65 FE) MG tablet Take 1 tablet (325 mg total) by mouth 2 (two) times daily with a meal. 60 tablet 2   furosemide (LASIX) 20 MG tablet Take 20 mg by mouth daily.     gabapentin (NEURONTIN) 300 MG capsule Take 300-600 mg by mouth See admin instructions. Take one capsule (300 mg) by mouth every morning and two capsules (600 mg) at night     gemfibrozil (LOPID) 600 MG tablet Take 600 mg by mouth 2 (two) times daily.     magnesium oxide (MAG-OX) 400 (240 Mg) MG tablet Take 1 tablet (400 mg total) by mouth 2 (two) times daily. 60 tablet 2   metFORMIN (GLUCOPHAGE) 500 MG tablet Take 500 mg by mouth 2 (two) times daily.     Multiple Vitamin (MULTIVITAMIN WITH MINERALS) TABS tablet Take 1 tablet by mouth daily. 30 tablet 0   ondansetron (ZOFRAN) 8 MG tablet Take 1 tablet (8 mg total) by mouth every 6 (six) hours as needed for nausea or vomiting. 40 tablet 1   aspirin 81 MG EC tablet Take 1 tablet (81 mg total) by mouth daily. RESUME AFTER 1 WEEK (Patient not taking: Reported on 01/08/2022) 30 tablet 1   clopidogrel (PLAVIX) 75 MG tablet Take 1 tablet (75 mg total) by mouth at  bedtime. 30 tablet 1   docusate sodium (COLACE) 100 MG capsule Take 100 mg by mouth 2 (two) times daily.     insulin degludec (TRESIBA FLEXTOUCH) 100 UNIT/ML FlexTouch Pen Inject 10 Units into the skin daily. (Patient taking differently: Inject 20 Units into the skin daily as needed (CBG >200).)     levothyroxine (SYNTHROID) 125 MCG tablet Take 1 tablet (125 mcg total) by mouth daily at 6 (six) AM. (Patient not taking: Reported on 01/08/2022) 30 tablet 2   melatonin 5 MG TABS Take 1 tablet (5 mg total) by mouth at bedtime. 30 tablet 0   nystatin (MYCOSTATIN/NYSTOP) powder Apply 1 application topically 2 (two) times daily. To groin rash (Patient not taking: Reported on 01/08/2022) 15 g 1   nystatin cream (MYCOSTATIN) Apply 1 application topically 2 (two) times daily  as needed (groin rash). Mix with triamcinolone cream (Patient not taking: Reported on 01/08/2022)     pantoprazole (PROTONIX) 40 MG tablet Take 1 tablet (40 mg total) by mouth 2 (two) times daily. 60 tablet 4   polyethylene glycol powder (GLYCOLAX/MIRALAX) 17 GM/SCOOP powder Take 17 g by mouth daily.     potassium chloride SA (KLOR-CON M) 20 MEQ tablet Take 1 tablet by mouth twice daily 60 tablet 0   prochlorperazine (COMPAZINE) 10 MG tablet Take 10 mg by mouth every 6 (six) hours as needed for nausea or vomiting. (Patient not taking: Reported on 01/08/2022)     sertraline (ZOLOFT) 25 MG tablet Take 25 mg by mouth daily. (Patient not taking: Reported on 01/08/2022)     triamcinolone cream (KENALOG) 0.1 % Apply 1 application topically 2 (two) times daily as needed (groin rash). Mix with nystatin cream (Patient not taking: Reported on 01/08/2022)     XTAMPZA ER 9 MG C12A Take 1 capsule by mouth 2 (two) times daily. (Patient not taking: Reported on 01/08/2022) 30 capsule 0   zinc sulfate 220 (50 Zn) MG capsule Take 1 capsule (220 mg total) by mouth daily. (Patient not taking: Reported on 01/08/2022) 30 capsule 0   Current Facility-Administered  Medications  Medication Dose Route Frequency Provider Last Rate Last Admin   0.9 %  sodium chloride infusion  500 mL Intravenous Once Thornton Park, MD       dextrose 5 % solution   Intravenous Continuous Thornton Park, MD        Allergies as of 01/08/2022 - Review Complete 01/08/2022  Allergen Reaction Noted   Codeine Nausea Only 10/19/2008    Family History  Problem Relation Age of Onset   Emphysema Father    Diabetes Father    Emphysema Sister    Emphysema Brother    Cancer Mother        Skin Cancer   Diabetes Mother    Colon cancer Neg Hx    Esophageal cancer Neg Hx    Rectal cancer Neg Hx    Stomach cancer Neg Hx      Physical Exam: General:   Alert,  well-nourished, pleasant and cooperative in NAD Head:  Normocephalic and atraumatic. Eyes:  Sclera clear, no icterus.   Conjunctiva pink. Mouth:  No deformity or lesions.   Neck:  Supple; no masses or thyromegaly. Lungs:  Clear throughout to auscultation.   No wheezes. Heart:  Regular rate and rhythm; no murmurs. Abdomen:  Soft, non-tender, nondistended, normal bowel sounds, no rebound or guarding.  Msk:  Symmetrical. No boney deformities LAD: No inguinal or umbilical LAD Extremities:  No clubbing or edema. Neurologic:  Alert and  oriented x4;  grossly nonfocal Skin:  No obvious rash or bruise. Psych:  Alert and cooperative. Normal mood and affect.     Studies/Results: No results found.    Mattia Liford L. Tarri Glenn, MD, MPH 01/08/2022, 9:15 AM

## 2022-01-09 ENCOUNTER — Other Ambulatory Visit: Payer: Self-pay

## 2022-01-09 DIAGNOSIS — K259 Gastric ulcer, unspecified as acute or chronic, without hemorrhage or perforation: Secondary | ICD-10-CM

## 2022-01-09 MED ORDER — PANTOPRAZOLE SODIUM 40 MG PO TBEC: 40.0000 mg | DELAYED_RELEASE_TABLET | Freq: Two times a day (BID) | ORAL | 0 refills | Status: DC

## 2022-01-09 MED ORDER — PANTOPRAZOLE SODIUM 40 MG PO TBEC: 40.0000 mg | DELAYED_RELEASE_TABLET | Freq: Every morning | ORAL | 3 refills | Status: DC

## 2022-01-10 ENCOUNTER — Telehealth: Payer: Self-pay | Admitting: *Deleted

## 2022-01-10 NOTE — Telephone Encounter (Signed)
°  Follow up Call-  Call back number 01/08/2022 01/02/2021  Post procedure Call Back phone  # [phone]  Permission to leave phone message Yes Yes  Some recent data might be hidden     Patient questions:  Do you have a fever, pain , or abdominal swelling? No. Pain Score  0 *  Have you tolerated food without any problems? Yes.    Have you been able to return to your normal activities? Yes.    Do you have any questions about your discharge instructions: Diet   No. Medications  No. Follow up visit  No.  Do you have questions or concerns about your Care? No.  Actions: * If pain score is 4 or above: No action needed, pain <4.

## 2022-01-12 ENCOUNTER — Encounter: Payer: Self-pay | Admitting: Gastroenterology

## 2022-01-17 ENCOUNTER — Other Ambulatory Visit: Payer: Self-pay | Admitting: Oncology

## 2022-01-17 DIAGNOSIS — C20 Malignant neoplasm of rectum: Secondary | ICD-10-CM

## 2022-01-21 ENCOUNTER — Inpatient Hospital Stay: Payer: Medicare HMO

## 2022-01-21 ENCOUNTER — Ambulatory Visit (HOSPITAL_BASED_OUTPATIENT_CLINIC_OR_DEPARTMENT_OTHER)
Admission: RE | Admit: 2022-01-21 | Discharge: 2022-01-21 | Disposition: A | Discharge status: Home / Self Care | Payer: Medicare HMO | Source: Ambulatory Visit | Attending: Nurse Practitioner | Admitting: Nurse Practitioner

## 2022-01-21 ENCOUNTER — Encounter (HOSPITAL_BASED_OUTPATIENT_CLINIC_OR_DEPARTMENT_OTHER): Payer: Self-pay

## 2022-01-21 ENCOUNTER — Inpatient Hospital Stay: Payer: Medicare HMO | Attending: Nurse Practitioner

## 2022-01-21 ENCOUNTER — Other Ambulatory Visit: Payer: Self-pay

## 2022-01-21 DIAGNOSIS — C20 Malignant neoplasm of rectum: Secondary | ICD-10-CM | POA: Insufficient documentation

## 2022-01-21 DIAGNOSIS — Z8616 Personal history of COVID-19: Secondary | ICD-10-CM | POA: Insufficient documentation

## 2022-01-21 DIAGNOSIS — N1330 Unspecified hydronephrosis: Secondary | ICD-10-CM | POA: Insufficient documentation

## 2022-01-21 DIAGNOSIS — Z8673 Personal history of transient ischemic attack (TIA), and cerebral infarction without residual deficits: Secondary | ICD-10-CM | POA: Insufficient documentation

## 2022-01-21 DIAGNOSIS — G629 Polyneuropathy, unspecified: Secondary | ICD-10-CM | POA: Insufficient documentation

## 2022-01-21 DIAGNOSIS — Z8541 Personal history of malignant neoplasm of cervix uteri: Secondary | ICD-10-CM | POA: Insufficient documentation

## 2022-01-21 DIAGNOSIS — E119 Type 2 diabetes mellitus without complications: Secondary | ICD-10-CM | POA: Insufficient documentation

## 2022-01-21 DIAGNOSIS — G8929 Other chronic pain: Secondary | ICD-10-CM | POA: Insufficient documentation

## 2022-01-21 LAB — CBC WITH DIFFERENTIAL (CANCER CENTER ONLY)
Abs Immature Granulocytes: 0.01 10*3/uL (ref 0.00–0.07)
Basophils Absolute: 0 10*3/uL (ref 0.0–0.1)
Basophils Relative: 1 %
Eosinophils Absolute: 0.1 10*3/uL (ref 0.0–0.5)
Eosinophils Relative: 2 %
HCT: 35.7 % — ABNORMAL LOW (ref 36.0–46.0)
Hemoglobin: 11.1 g/dL — ABNORMAL LOW (ref 12.0–15.0)
Immature Granulocytes: 0 %
Lymphocytes Relative: 12 %
Lymphs Abs: 0.8 10*3/uL (ref 0.7–4.0)
MCH: 30 pg (ref 26.0–34.0)
MCHC: 31.1 g/dL (ref 30.0–36.0)
MCV: 96.5 fL (ref 80.0–100.0)
Monocytes Absolute: 0.4 10*3/uL (ref 0.1–1.0)
Monocytes Relative: 6 %
Neutro Abs: 4.8 10*3/uL (ref 1.7–7.7)
Neutrophils Relative %: 79 %
Platelet Count: 157 10*3/uL (ref 150–400)
RBC: 3.7 MIL/uL — ABNORMAL LOW (ref 3.87–5.11)
RDW: 14.9 % (ref 11.5–15.5)
WBC Count: 6.1 10*3/uL (ref 4.0–10.5)
nRBC: 0 % (ref 0.0–0.2)

## 2022-01-21 LAB — CMP (CANCER CENTER ONLY)
ALT: 5 U/L (ref 0–44)
AST: 17 U/L (ref 15–41)
Albumin: 3.4 g/dL — ABNORMAL LOW (ref 3.5–5.0)
Alkaline Phosphatase: 154 U/L — ABNORMAL HIGH (ref 38–126)
Anion gap: 12 (ref 5–15)
BUN: 16 mg/dL (ref 8–23)
CO2: 23 mmol/L (ref 22–32)
Calcium: 9 mg/dL (ref 8.9–10.3)
Chloride: 107 mmol/L (ref 98–111)
Creatinine: 0.78 mg/dL (ref 0.44–1.00)
GFR, Estimated: >60 mL/min (ref >60)
Glucose, Bld: 85 mg/dL (ref 70–99)
Potassium: 4.2 mmol/L (ref 3.5–5.1)
Sodium: 142 mmol/L (ref 135–145)
Total Bilirubin: 0.4 mg/dL (ref 0.3–1.2)
Total Protein: 6.1 g/dL — ABNORMAL LOW (ref 6.5–8.1)

## 2022-01-21 LAB — MAGNESIUM: Magnesium: 1.5 mg/dL — ABNORMAL LOW (ref 1.7–2.4)

## 2022-01-21 MED ORDER — HEPARIN SOD (PORK) LOCK FLUSH 100 UNIT/ML IV SOLN
500.0000 [IU] | Freq: Once | INTRAVENOUS | Status: AC
  Administered 2022-01-21: 500 [IU] via INTRAVENOUS

## 2022-01-21 MED ORDER — IOHEXOL 300 MG/ML  SOLN
100.0000 mL | Freq: Once | INTRAMUSCULAR | Status: AC | PRN
  Administered 2022-01-21: 80 mL via INTRAVENOUS

## 2022-01-27 ENCOUNTER — Other Ambulatory Visit: Payer: Self-pay

## 2022-01-27 ENCOUNTER — Inpatient Hospital Stay: Payer: Medicare HMO | Admitting: Oncology

## 2022-01-27 VITALS — BP 157/62 | HR 73 | Temp 98.1°F | Resp 18 | Ht 60.0 in | Wt 134.0 lb

## 2022-01-27 DIAGNOSIS — Z8673 Personal history of transient ischemic attack (TIA), and cerebral infarction without residual deficits: Secondary | ICD-10-CM | POA: Diagnosis not present

## 2022-01-27 DIAGNOSIS — C20 Malignant neoplasm of rectum: Secondary | ICD-10-CM

## 2022-01-27 DIAGNOSIS — N1330 Unspecified hydronephrosis: Secondary | ICD-10-CM | POA: Diagnosis not present

## 2022-01-27 DIAGNOSIS — Z8616 Personal history of COVID-19: Secondary | ICD-10-CM | POA: Diagnosis not present

## 2022-01-27 DIAGNOSIS — E119 Type 2 diabetes mellitus without complications: Secondary | ICD-10-CM | POA: Diagnosis not present

## 2022-01-27 DIAGNOSIS — Z8541 Personal history of malignant neoplasm of cervix uteri: Secondary | ICD-10-CM | POA: Diagnosis not present

## 2022-01-27 DIAGNOSIS — G629 Polyneuropathy, unspecified: Secondary | ICD-10-CM | POA: Diagnosis not present

## 2022-01-27 DIAGNOSIS — G8929 Other chronic pain: Secondary | ICD-10-CM | POA: Diagnosis not present

## 2022-01-27 NOTE — Progress Notes (Signed)
?[facility] ?OFFICE PROGRESS NOTE ? ? ?Diagnosis: Rectal cancer ? ?INTERVAL HISTORY:  ? ?[name] returns as scheduled.  She reports feeling well.  Good appetite.  She has intermittent rectal bleeding.  The colostomy is functioning.  No new complaint.   ? ?Objective: ? ?Vital signs in last 24 hours: ? ?Blood pressure (!) 157/62, pulse 73, temperature 98.1 ?F (36.7 ?C), temperature source Oral, resp. rate 18, height 5' (1.524 m), weight 134 lb (60.8 kg), SpO2 98 %. ?  ? ?Lymphatics: No cervical, supraclavicular, axillary, or inguinal nodes ?Resp: Lungs clear bilaterally ?Cardio: Regular rate and rhythm ?GI: No hepatomegaly, left lower quadrant colostomy ?Vascular: Trace edema in the right lower leg ?  ? ?Portacath/PICC-without erythema ? ?Lab Results: ? ?Lab Results  ?Component Value Date  ? WBC 6.1 01/21/2022  ? HGB 11.1 (L) 01/21/2022  ? HCT 35.7 (L) 01/21/2022  ? MCV 96.5 01/21/2022  ? PLT 157 01/21/2022  ? NEUTROABS 4.8 01/21/2022  ? ? ?CMP  ?Lab Results  ?Component Value Date  ? NA 142 01/21/2022  ? K 4.2 01/21/2022  ? CL 107 01/21/2022  ? CO2 23 01/21/2022  ? GLUCOSE 85 01/21/2022  ? BUN 16 01/21/2022  ? CREATININE 0.78 01/21/2022  ? CALCIUM 9.0 01/21/2022  ? PROT 6.1 (L) 01/21/2022  ? ALBUMIN 3.4 (L) 01/21/2022  ? AST 17 01/21/2022  ? ALT 5 01/21/2022  ? ALKPHOS 154 (H) 01/21/2022  ? BILITOT 0.4 01/21/2022  ? GFRNONAA >60 01/21/2022  ? GFRAA >60 03/29/2020  ? ? ?Lab Results  ?Component Value Date  ? CEA1 28.60 (H) 06/12/2021  ? CEA 7.29 (H) 11/26/2021  ? ? ? ?Medications: I have reviewed the patient's current medications. ? ? ?Assessment/Plan: ?Rectal cancer ?Partially obstructing mass beginning at 10 cm from the anal verge on colonoscopy 01/23/2021, biopsy-adenocarcinoma, immunohistochemical stains at [facility], CDX-2, and SATB2 positive, patchy strong staining for p16.  The rectal tumor has a distinct immunohistochemical profile suggesting a primary colorectal tumor as opposed to metastatic  cervical cancer. ?CT abdomen/pelvis 01/18/2021-bilateral hydronephrosis, endometrial fluid collection, prominent stool in the colon ?MRI pelvis 01/23/2021-tumor at 7.6 cm from the anal verge, abnormal signal bridge in the cervix, mesorectum, and anterior aspect of the rectum-similar to MRI from 2020, MRI stage could not be defined secondary to posttreatment changes in the pelvis ?03/18/2021-total abdominal hysterectomy, cystoscopy with insertion of ureteral stents, exploratory laparotomy, aborted low anterior resection, descending loop colostomy--right and left fallopian tubes and ovaries negative for malignancy; excision portion of cervix positive for adenocarcinoma; uterus with invasive moderately differentiated adenocarcinoma of the cervix, HPV associated, invading through full-thickness of the cervix/lower uterine segment and into parametrial tissue, margins of resection received disrupted, cannot be evaluated; remaining uterus with extensive endocervical adenocarcinoma in situ involving lower uterine segment and replacing endometrium.  This carcinoma was felt to be a separate primary from the rectal tumor. ?PET scan at [facility] 04/11/2021-intense FDG activity in region of known rectal mass.  Hypermetabolic left external iliac and right inguinal lymph nodes.  Minimal FDG uptake and small right supraclavicular lymph node.  No uptake seen in the cervix. ?04/23/2021 FNA right inguinal lymph node-negative for malignancy ?Evaluated by Dr. Leamon Arnt 05/21/2021, 05/28/2021-appears to have 2 synchronous malignancies (cervical adenocarcinoma and rectal adenocarcinoma); further surgery which would entail pelvic exenteration not recommended by gynecologic oncology or colorectal surgery.  Full doses of radiotherapy not felt to be feasible.  Recommended 3 months of CAPOX and then repeat MRI of abdomen/pelvis, chest CT and colonoscopy. ?  Cycle 1 Xeloda 06/13/2021 ?Cycle 1 FOLFOX 07/08/2021, oxaliplatin dose reduced secondary to pre-existing  neuropathy ?Cycle 2 FOLFOX 07/29/2021, Udenyca ?Cycle 3 FOLFOX 08/12/2021, Udenyca ?Cycle 4 FOLFOX 09/02/2021, Udenyca ?Cycle 5 FOLFOX 09/17/2021, Udenyca ?CTs at [facility] 10/01/2021-unchanged findings of rectal malignancy status posttreatment.  Marked thickening of the proximal duodenal wall.  Unchanged bilateral inguinal lymph nodes which are not enlarged but avid on prior PET.  Unchanged diffuse bladder wall thickening and perivesicular stranding which may represent radiation cystitis.  New mild right lower lobe bronchial wall thickening, mucus impaction and tree-in-bud pulmonary nodules. ?Cycle 6 FOLFOX 10/22/2021, Udenyca ?CT 01/21/2022-stable soft tissue thickening at the lower rectum, slight increase in adjacent stranding no evidence of metastatic disease in the chest, abdomen, or pelvis, interval diverting loop colostomy ?  ?  ?Bilateral hydronephrosis-secondary to urinary retention? ?Hematometra found on pelvic MRI 01/23/2021, evaluated by gynecologic oncology ?Cervical cancer November 1994, stage Ib, treated with external beam radiation and brachytherapy at [facility] ?Diabetes ?Neuropathy ?Chronic pain secondary to #6 ?Recurrent urinary tract infections ?History of a CVA ?History of hyperthyroidism ?G3, P3 ?COVID-19 infection January 2021 ?Admission 08/25/2021 with a urinary tract infection-culture negative ?Anemia secondary to chronic disease, chemotherapy, and rectal bleeding-1 unit packed red blood cells 08/26/2021 ?Hospital admission 09/23/2021-symptomatic anemia ?16.  Admission 10/08/2021 with altered mental status, nausea/vomiting, and left foot ulcers ?Urine culture 10/08/2021 positive for Pseudomonas aeruginosa ?EGD 10/12/2021-gastric and duodenal ulcers-not bleeding ?17.  Admission 11/29/2021 through 12/02/2021 with COVID-pneumonia ?  ?  ? ? ?Disposition: ?[name] appears stable.  I reviewed the restaging CT findings and images with Ms. Tesoriero and her son.  There is no clinical or radiologic evidence of disease  progression.  The plan is to continue observation.  She continues potassium and magnesium supplements.  We will check electrolytes when she returns for an office visit in 5 weeks.  The Port-A-Cath remains in place. ? ?Betsy Coder, MD ? ?01/27/2022  ?1:18 PM ? ? ?

## 2022-02-17 ENCOUNTER — Other Ambulatory Visit: Payer: Self-pay | Admitting: Oncology

## 2022-02-18 ENCOUNTER — Encounter: Payer: Self-pay | Admitting: Oncology

## 2022-03-03 ENCOUNTER — Inpatient Hospital Stay: Payer: Medicare HMO

## 2022-03-03 ENCOUNTER — Other Ambulatory Visit: Payer: Self-pay

## 2022-03-03 ENCOUNTER — Telehealth: Payer: Self-pay

## 2022-03-03 ENCOUNTER — Inpatient Hospital Stay: Payer: Medicare HMO | Admitting: Oncology

## 2022-03-03 ENCOUNTER — Inpatient Hospital Stay: Payer: Medicare HMO | Attending: Nurse Practitioner

## 2022-03-03 VITALS — BP 130/70 | HR 60 | Temp 97.8°F | Resp 18 | Ht 60.0 in | Wt 134.2 lb

## 2022-03-03 DIAGNOSIS — Z7982 Long term (current) use of aspirin: Secondary | ICD-10-CM | POA: Insufficient documentation

## 2022-03-03 DIAGNOSIS — Z8673 Personal history of transient ischemic attack (TIA), and cerebral infarction without residual deficits: Secondary | ICD-10-CM | POA: Insufficient documentation

## 2022-03-03 DIAGNOSIS — Z95828 Presence of other vascular implants and grafts: Secondary | ICD-10-CM

## 2022-03-03 DIAGNOSIS — E875 Hyperkalemia: Secondary | ICD-10-CM | POA: Insufficient documentation

## 2022-03-03 DIAGNOSIS — C20 Malignant neoplasm of rectum: Secondary | ICD-10-CM | POA: Insufficient documentation

## 2022-03-03 LAB — CMP (CANCER CENTER ONLY)
ALT: <5 U/L (ref 0–44)
AST: 13 U/L — ABNORMAL LOW (ref 15–41)
Albumin: 3.7 g/dL (ref 3.5–5.0)
Alkaline Phosphatase: 146 U/L — ABNORMAL HIGH (ref 38–126)
Anion gap: 8 (ref 5–15)
BUN: 21 mg/dL (ref 8–23)
CO2: 27 mmol/L (ref 22–32)
Calcium: 8.9 mg/dL (ref 8.9–10.3)
Chloride: 105 mmol/L (ref 98–111)
Creatinine: 0.91 mg/dL (ref 0.44–1.00)
GFR, Estimated: >60 mL/min (ref >60)
Glucose, Bld: 102 mg/dL — ABNORMAL HIGH (ref 70–99)
Potassium: 5.7 mmol/L — ABNORMAL HIGH (ref 3.5–5.1)
Sodium: 140 mmol/L (ref 135–145)
Total Bilirubin: 0.5 mg/dL (ref 0.3–1.2)
Total Protein: 6.3 g/dL — ABNORMAL LOW (ref 6.5–8.1)

## 2022-03-03 LAB — CBC WITH DIFFERENTIAL (CANCER CENTER ONLY)
Abs Immature Granulocytes: 0.01 10*3/uL (ref 0.00–0.07)
Basophils Absolute: 0 10*3/uL (ref 0.0–0.1)
Basophils Relative: 0 %
Eosinophils Absolute: 0.1 10*3/uL (ref 0.0–0.5)
Eosinophils Relative: 2 %
HCT: 30.2 % — ABNORMAL LOW (ref 36.0–46.0)
Hemoglobin: 9.3 g/dL — ABNORMAL LOW (ref 12.0–15.0)
Immature Granulocytes: 0 %
Lymphocytes Relative: 22 %
Lymphs Abs: 1.3 10*3/uL (ref 0.7–4.0)
MCH: 29.2 pg (ref 26.0–34.0)
MCHC: 30.8 g/dL (ref 30.0–36.0)
MCV: 94.7 fL (ref 80.0–100.0)
Monocytes Absolute: 0.4 10*3/uL (ref 0.1–1.0)
Monocytes Relative: 7 %
Neutro Abs: 4 10*3/uL (ref 1.7–7.7)
Neutrophils Relative %: 69 %
Platelet Count: 172 10*3/uL (ref 150–400)
RBC: 3.19 MIL/uL — ABNORMAL LOW (ref 3.87–5.11)
RDW: 14.5 % (ref 11.5–15.5)
WBC Count: 5.8 10*3/uL (ref 4.0–10.5)
nRBC: 0 % (ref 0.0–0.2)

## 2022-03-03 LAB — CEA (ACCESS): CEA (CHCC): 15.6 ng/mL — ABNORMAL HIGH (ref 0.00–5.00)

## 2022-03-03 LAB — MAGNESIUM: Magnesium: 1.5 mg/dL — ABNORMAL LOW (ref 1.7–2.4)

## 2022-03-03 MED ORDER — HEPARIN SOD (PORK) LOCK FLUSH 100 UNIT/ML IV SOLN
500.0000 [IU] | Freq: Once | INTRAVENOUS | Status: AC
  Administered 2022-03-03: 500 [IU] via INTRAVENOUS

## 2022-03-03 MED ORDER — SODIUM CHLORIDE 0.9% FLUSH
10.0000 mL | Freq: Once | INTRAVENOUS | Status: AC
  Administered 2022-03-03: 10 mL via INTRAVENOUS

## 2022-03-03 NOTE — Progress Notes (Signed)
?[facility] ?OFFICE PROGRESS NOTE ? ? ?Diagnosis: Rectal cancer ? ?INTERVAL HISTORY:  ? ?[name] returns as scheduled.  She is here with her son.  Good appetite.  She has intermittent rectal and vaginal bleeding.  She reports there is blood in the [employer] container in the mornings.  She has discomfort surrounding the colostomy and there is some blood from the stoma. ? ?Objective: ? ?Vital signs in last 24 hours: ? ?Blood pressure 130/70, pulse 60, temperature 97.8 ?F (36.6 ?C), temperature source Oral, resp. rate 18, height 5' (1.524 m), weight 134 lb 3.2 oz (60.9 kg), SpO2 98 %. ?  ? ?Lymphatics: No cervical, supraclavicular, axillary, or inguinal nodes ?Resp: Lungs clear bilaterally ?Cardio: Regular rate and rhythm ?GI: No hepatomegaly, left lower quadrant colostomy with a small amount of blood on the stoma mucosa ?Vascular: The right lower leg is larger than the left side ?Neuro: She ambulated to the exam table with assistance ? ?Portacath/PICC-without erythema ? ?Lab Results: ? ?Lab Results  ?Component Value Date  ? WBC 5.8 03/03/2022  ? HGB 9.3 (L) 03/03/2022  ? HCT 30.2 (L) 03/03/2022  ? MCV 94.7 03/03/2022  ? PLT 172 03/03/2022  ? NEUTROABS 4.0 03/03/2022  ? ? ?CMP  ?Lab Results  ?Component Value Date  ? NA 140 03/03/2022  ? K 5.7 (H) 03/03/2022  ? CL 105 03/03/2022  ? CO2 27 03/03/2022  ? GLUCOSE 102 (H) 03/03/2022  ? BUN 21 03/03/2022  ? CREATININE 0.91 03/03/2022  ? CALCIUM 8.9 03/03/2022  ? PROT 6.3 (L) 03/03/2022  ? ALBUMIN 3.7 03/03/2022  ? AST 13 (L) 03/03/2022  ? ALT <5 03/03/2022  ? ALKPHOS 146 (H) 03/03/2022  ? BILITOT 0.5 03/03/2022  ? GFRNONAA >60 03/03/2022  ? GFRAA >60 03/29/2020  ? ? ?Lab Results  ?Component Value Date  ? CEA1 28.60 (H) 06/12/2021  ? CEA 15.60 (H) 03/03/2022  ? ? ?Medications: I have reviewed the patient's current medications. ? ? ?Assessment/Plan: ?Rectal cancer ?Partially obstructing mass beginning at 10 cm from the anal verge on colonoscopy 01/23/2021,  biopsy-adenocarcinoma, immunohistochemical stains at [facility], CDX-2, and SATB2 positive, patchy strong staining for p16.  The rectal tumor has a distinct immunohistochemical profile suggesting a primary colorectal tumor as opposed to metastatic cervical cancer. ?CT abdomen/pelvis 01/18/2021-bilateral hydronephrosis, endometrial fluid collection, prominent stool in the colon ?MRI pelvis 01/23/2021-tumor at 7.6 cm from the anal verge, abnormal signal bridge in the cervix, mesorectum, and anterior aspect of the rectum-similar to MRI from 2020, MRI stage could not be defined secondary to posttreatment changes in the pelvis ?03/18/2021-total abdominal hysterectomy, cystoscopy with insertion of ureteral stents, exploratory laparotomy, aborted low anterior resection, descending loop colostomy--right and left fallopian tubes and ovaries negative for malignancy; excision portion of cervix positive for adenocarcinoma; uterus with invasive moderately differentiated adenocarcinoma of the cervix, HPV associated, invading through full-thickness of the cervix/lower uterine segment and into parametrial tissue, margins of resection received disrupted, cannot be evaluated; remaining uterus with extensive endocervical adenocarcinoma in situ involving lower uterine segment and replacing endometrium.  This carcinoma was felt to be a separate primary from the rectal tumor. ?PET scan at [facility] 04/11/2021-intense FDG activity in region of known rectal mass.  Hypermetabolic left external iliac and right inguinal lymph nodes.  Minimal FDG uptake and small right supraclavicular lymph node.  No uptake seen in the cervix. ?04/23/2021 FNA right inguinal lymph node-negative for malignancy ?Evaluated by Dr. Leamon Arnt 05/21/2021, 05/28/2021-appears to have 2 synchronous malignancies (cervical adenocarcinoma and  rectal adenocarcinoma); further surgery which would entail pelvic exenteration not recommended by gynecologic oncology or colorectal surgery.  Full doses  of radiotherapy not felt to be feasible.  Recommended 3 months of CAPOX and then repeat MRI of abdomen/pelvis, chest CT and colonoscopy. ?Cycle 1 Xeloda 06/13/2021 ?Cycle 1 FOLFOX 07/08/2021, oxaliplatin dose reduced secondary to pre-existing neuropathy ?Cycle 2 FOLFOX 07/29/2021, Udenyca ?Cycle 3 FOLFOX 08/12/2021, Udenyca ?Cycle 4 FOLFOX 09/02/2021, Udenyca ?Cycle 5 FOLFOX 09/17/2021, Udenyca ?CTs at [facility] 10/01/2021-unchanged findings of rectal malignancy status posttreatment.  Marked thickening of the proximal duodenal wall.  Unchanged bilateral inguinal lymph nodes which are not enlarged but avid on prior PET.  Unchanged diffuse bladder wall thickening and perivesicular stranding which may represent radiation cystitis.  New mild right lower lobe bronchial wall thickening, mucus impaction and tree-in-bud pulmonary nodules. ?Cycle 6 FOLFOX 10/22/2021, Udenyca ?CT 01/21/2022-stable soft tissue thickening at the lower rectum, slight increase in adjacent stranding no evidence of metastatic disease in the chest, abdomen, or pelvis, interval diverting loop colostomy ?  ?  ?Bilateral hydronephrosis-secondary to urinary retention? ?Hematometra found on pelvic MRI 01/23/2021, evaluated by gynecologic oncology ?Cervical cancer November 1994, stage Ib, treated with external beam radiation and brachytherapy at [facility] ?Diabetes ?Neuropathy ?Chronic pain secondary to #6 ?Recurrent urinary tract infections ?History of a CVA ?History of hyperthyroidism ?G3, P3 ?COVID-19 infection January 2021 ?Admission 08/25/2021 with a urinary tract infection-culture negative ?Anemia secondary to chronic disease, chemotherapy, and rectal bleeding-1 unit packed red blood cells 08/26/2021 ?Hospital admission 09/23/2021-symptomatic anemia ?16.  Admission 10/08/2021 with altered mental status, nausea/vomiting, and left foot ulcers ?Urine culture 10/08/2021 positive for Pseudomonas aeruginosa ?EGD 10/12/2021-gastric and duodenal ulcers-not bleeding ?17.   Admission 11/29/2021 through 12/02/2021 with COVID-pneumonia ?  ?  ? ? ? ?Disposition: ?[name] appears unchanged.  She will call for increased bleeding.  She is maintained on aspirin and Plavix after a CVA.  She will continue these for now. ?The potassium is elevated today.  She will discontinue the potassium supplement.  We will ask her to return for a chemistry panel later this week. ? ?She will return for an office and lab visit in 6 weeks. ? ?[name] will be referred to the wound ostomy clinic. ? ?The hemoglobin is lower today, but not changed significantly compared to 2 months ago. ? ?Betsy Coder, MD ? ?03/03/2022  ?3:34 PM ? ? ?

## 2022-03-03 NOTE — Telephone Encounter (Signed)
Referral of ostomy clinic was placed in epic ?

## 2022-03-10 ENCOUNTER — Ambulatory Visit (HOSPITAL_COMMUNITY)
Admission: RE | Admit: 2022-03-10 | Discharge: 2022-03-10 | Disposition: A | Discharge status: Home / Self Care | Payer: Medicare HMO | Source: Ambulatory Visit | Attending: Oncology | Admitting: Oncology

## 2022-03-10 DIAGNOSIS — K9400 Colostomy complication, unspecified: Secondary | ICD-10-CM

## 2022-03-10 DIAGNOSIS — Z933 Colostomy status: Secondary | ICD-10-CM | POA: Insufficient documentation

## 2022-03-10 DIAGNOSIS — C20 Malignant neoplasm of rectum: Secondary | ICD-10-CM | POA: Diagnosis not present

## 2022-03-10 DIAGNOSIS — L24B1 Irritant contact dermatitis related to digestive stoma or fistula: Secondary | ICD-10-CM | POA: Diagnosis not present

## 2022-03-10 NOTE — Progress Notes (Signed)
[facility]  ? ?Reason for visit:  ?LLQ colostomy with infrequent leaks ?HPI:  ?Rectal CA with colostomy ?ROS  ?Review of Systems  ?Gastrointestinal:   ?     Excess abdominal skin above stoma, causing pouch to lose seal  ?Skin:  Positive for rash.  ?     Contact dermatitis to peristomal skin  ?All other systems reviewed and are negative. ?Vital signs:  ?BP (!) 143/41 (BP Location: Left Arm)   Pulse (!) 59   Temp 98.2 ?F (36.8 ?C) (Oral)   Resp 17   SpO2 96%  ?Exam:  ?Physical Exam ?Constitutional:   ?   Appearance: Normal appearance.  ?Abdominal:  ?   Palpations: Abdomen is soft.  ?Skin: ?   General: Skin is warm and dry.  ?   Comments: Contact dermatitis around colostomy  ?Neurological:  ?   Mental Status: She is oriented to person, place, and time.  ?Psychiatric:     ?   Mood and Affect: Mood normal.  ?  ?Stoma type/location:  LMQ colostomy ?Stomal assessment/size:  1 3/4" pink and moist ?Peristomal assessment:  sagging skin to top half of peristomal skin is causing pouch to lose its seal.   ?Treatment options for stomal/peristomal skin: 1 piece convex pouch with barrier ring and ostomy belt. Barrier ring and skin prep to peristomal skin  ?Output: soft brown stool ?Ostomy pouching: 1pc. convex ?Education provided:  Needed firm convexity to give flat pouching surface.  Belt will help support pouch as well.  ? ?  ?Impression/dx  ?Contact dermatitis ?colostomy ?Discussion  ?Try firm convexity to promote seal ?Plan  ?Call and let me know how this pouch is working and if I can set up with a supply company.  ? ? ? ?Visit time: 45 minutes.  ? ?Domenic Moras FNP-BC ? ?  ?

## 2022-03-11 NOTE — Discharge Instructions (Signed)
Call and let me know if you like the convex pouch ?

## 2022-03-26 ENCOUNTER — Ambulatory Visit: Payer: Medicare HMO | Admitting: Podiatry

## 2022-03-26 ENCOUNTER — Encounter: Payer: Self-pay | Admitting: Podiatry

## 2022-03-26 DIAGNOSIS — E1142 Type 2 diabetes mellitus with diabetic polyneuropathy: Secondary | ICD-10-CM

## 2022-03-26 DIAGNOSIS — M79674 Pain in right toe(s): Secondary | ICD-10-CM | POA: Diagnosis not present

## 2022-03-26 DIAGNOSIS — B351 Tinea unguium: Secondary | ICD-10-CM | POA: Diagnosis not present

## 2022-03-26 DIAGNOSIS — L84 Corns and callosities: Secondary | ICD-10-CM

## 2022-03-26 DIAGNOSIS — M79675 Pain in left toe(s): Secondary | ICD-10-CM | POA: Diagnosis not present

## 2022-04-05 NOTE — Progress Notes (Signed)
  Subjective:  Patient ID: [name], female    DOB: March 03, 1946,  MRN: [id number]  [name] presents to clinic today for at risk foot care with history of diabetic neuropathy, thick, elongated toenails b/l feet which are tender when wearing enclosed shoe gear., and preulcerative lesion(s) both feet.  Last known HgA1c was unknown. Patient cannot remember her blood glucose reading for today.  [name] is accompanied by her son, [name], on today's visit. They voice no new pedal concerns today.  She continues to be followed by Oncology for rectal cancer.  She has been followed by Dr. Blenda Mounts for diabetic ulceration plantar aspect right foot which has resolved.  PCP is Everardo Beals, NP , and last visit was August 21, 2021.  Allergies  Allergen Reactions   Codeine Nausea Only   Review of Systems: Negative except as noted in the HPI.  Objective: No changes noted in today's physical examination.  Physical Exam  General: [name] is a pleasant 76 y.o. Caucasian female, WD, WN in NAD. AAO x 3.   Vascular:  Capillary fill time to digits <3 seconds b/l lower extremities. Palpable DP pulse(s) b/l lower extremities Palpable PT pulse(s) b/l lower extremities Pedal hair absent. Lower extremity skin temperature gradient within normal limits. No pain with calf compression b/l. Trace edema noted right lower extremity with no open wounds. No warmth, no frank cellulitis.  Dermatological:  Skin warm and supple b/l lower extremities. No open wounds b/l lower extremities. No interdigital macerations b/l lower extremities. Toenails 1-5 b/l elongated, discolored, dystrophic, thickened, crumbly with subungual debris and tenderness to dorsal palpation. Hyperkeratotic lesion(s)  sub IPJ R hallux.  No erythema, no edema, no drainage, no fluctuance. Prior location of ulceration submet head 1 right foot completely healed. She has minimal hyperkeratosis sub hallux IPJ of the left  foot.  Musculoskeletal:  Normal muscle strength 5/5 to all lower extremity muscle groups bilaterally. No pain crepitus or joint limitation noted with ROM b/l lower extremities. Hammertoe(s) noted to the L 2nd toe and R 2nd toe.   Neurological:  Protective sensation diminished with 10g monofilament b/l. Vibratory sensation decreased b/l.     Latest Ref Rng & Units 08/26/2021    3:50 AM  Hemoglobin A1C  Hemoglobin-A1c 4.8 - 5.6 % 7.7     Assessment/Plan: 1. Pain due to onychomycosis of toenails of both feet   2. Callus   3. Diabetic peripheral neuropathy associated with type 2 diabetes mellitus ([place])     -Patient was evaluated and treated. All patient's and/or POA's questions/concerns answered on today's visit. -Patient to continue soft, supportive shoe gear daily. -Toenails 1-5 b/l were debrided in length and girth with sterile nail nippers and dremel without iatrogenic bleeding.  -Callus(es) R hallux pared utilizing sterile scalpel blade without complication or incident. Total number debrided =1. -Patient/POA to call should there be question/concern in the interim.   Return in about 3 months (around 06/26/2022).  [name], DPM

## 2022-04-10 ENCOUNTER — Inpatient Hospital Stay: Payer: Medicare HMO | Attending: Nurse Practitioner

## 2022-04-10 ENCOUNTER — Encounter: Payer: Self-pay | Admitting: Nurse Practitioner

## 2022-04-10 ENCOUNTER — Inpatient Hospital Stay: Payer: Medicare HMO

## 2022-04-10 ENCOUNTER — Inpatient Hospital Stay: Payer: Medicare HMO | Admitting: Nurse Practitioner

## 2022-04-10 VITALS — BP 144/58 | HR 60 | Temp 98.2°F | Resp 18 | Ht 60.0 in | Wt 138.0 lb

## 2022-04-10 DIAGNOSIS — Z95828 Presence of other vascular implants and grafts: Secondary | ICD-10-CM

## 2022-04-10 DIAGNOSIS — C20 Malignant neoplasm of rectum: Secondary | ICD-10-CM

## 2022-04-10 DIAGNOSIS — Z8541 Personal history of malignant neoplasm of cervix uteri: Secondary | ICD-10-CM | POA: Insufficient documentation

## 2022-04-10 LAB — CBC WITH DIFFERENTIAL (CANCER CENTER ONLY)
Abs Immature Granulocytes: 0.03 10*3/uL (ref 0.00–0.07)
Basophils Absolute: 0 10*3/uL (ref 0.0–0.1)
Basophils Relative: 1 %
Eosinophils Absolute: 0.1 10*3/uL (ref 0.0–0.5)
Eosinophils Relative: 2 %
HCT: 32.5 % — ABNORMAL LOW (ref 36.0–46.0)
Hemoglobin: 10 g/dL — ABNORMAL LOW (ref 12.0–15.0)
Immature Granulocytes: 1 %
Lymphocytes Relative: 19 %
Lymphs Abs: 0.9 10*3/uL (ref 0.7–4.0)
MCH: 28.8 pg (ref 26.0–34.0)
MCHC: 30.8 g/dL (ref 30.0–36.0)
MCV: 93.7 fL (ref 80.0–100.0)
Monocytes Absolute: 0.3 10*3/uL (ref 0.1–1.0)
Monocytes Relative: 6 %
Neutro Abs: 3.3 10*3/uL (ref 1.7–7.7)
Neutrophils Relative %: 71 %
Platelet Count: 167 10*3/uL (ref 150–400)
RBC: 3.47 MIL/uL — ABNORMAL LOW (ref 3.87–5.11)
RDW: 14.1 % (ref 11.5–15.5)
WBC Count: 4.5 10*3/uL (ref 4.0–10.5)
nRBC: 0 % (ref 0.0–0.2)

## 2022-04-10 LAB — CMP (CANCER CENTER ONLY)
ALT: 5 U/L (ref 0–44)
AST: 13 U/L — ABNORMAL LOW (ref 15–41)
Albumin: 3.8 g/dL (ref 3.5–5.0)
Alkaline Phosphatase: 118 U/L (ref 38–126)
Anion gap: 8 (ref 5–15)
BUN: 17 mg/dL (ref 8–23)
CO2: 26 mmol/L (ref 22–32)
Calcium: 8.8 mg/dL — ABNORMAL LOW (ref 8.9–10.3)
Chloride: 108 mmol/L (ref 98–111)
Creatinine: 0.8 mg/dL (ref 0.44–1.00)
GFR, Estimated: >60 mL/min (ref >60)
Glucose, Bld: 134 mg/dL — ABNORMAL HIGH (ref 70–99)
Potassium: 5.1 mmol/L (ref 3.5–5.1)
Sodium: 142 mmol/L (ref 135–145)
Total Bilirubin: 0.4 mg/dL (ref 0.3–1.2)
Total Protein: 6.4 g/dL — ABNORMAL LOW (ref 6.5–8.1)

## 2022-04-10 LAB — SAMPLE TO BLOOD BANK

## 2022-04-10 LAB — MAGNESIUM: Magnesium: 1.5 mg/dL — ABNORMAL LOW (ref 1.7–2.4)

## 2022-04-10 MED ORDER — SODIUM CHLORIDE 0.9% FLUSH: 10.0000 mL | Freq: Once | INTRAVENOUS | Status: DC

## 2022-04-10 MED ORDER — HEPARIN SOD (PORK) LOCK FLUSH 100 UNIT/ML IV SOLN: 500.0000 [IU] | Freq: Once | INTRAVENOUS | Status: DC

## 2022-04-10 NOTE — Progress Notes (Signed)
[place] OFFICE PROGRESS NOTE   Diagnosis: Rectal cancer  INTERVAL HISTORY:   Ms. Vanvalkenburgh returns as scheduled.  Overall she is feeling well.  She has intermittent rectal bleeding.  She has periodic pain around the stoma.  No rectal pain.  She has a good appetite.  She had a recent fall while making the bed.  Objective:  Vital signs in last 24 hours:  Blood pressure (!) 144/58, pulse 60, temperature 98.2 F (36.8 C), resp. rate 18, height 5' (1.524 m), weight 138 lb (62.6 kg), SpO2 100 %.    HEENT: No thrush or ulcers. Lymphatics: No palpable cervical or supraclavicular lymph nodes. Resp: Lungs clear bilaterally. Cardio: Regular rate and rhythm. GI: No hepatomegaly.  Left lower quadrant colostomy. Vascular: Right lower leg is larger than the left lower leg. Skin: Small scattered ecchymoses forearms and upper chest. Port-A-Cath without erythema.  Lab Results:  Lab Results  Component Value Date   WBC 4.5 04/10/2022   HGB 10.0 (L) 04/10/2022   HCT 32.5 (L) 04/10/2022   MCV 93.7 04/10/2022   PLT 167 04/10/2022   NEUTROABS 3.3 04/10/2022    Imaging:  No results found.  Medications: I have reviewed the patient's current medications.  Assessment/Plan: Rectal cancer Partially obstructing mass beginning at 10 cm from the anal verge on colonoscopy 01/23/2021, biopsy-adenocarcinoma, immunohistochemical stains at [facility], CDX-2, and SATB2 positive, patchy strong staining for p16.  The rectal tumor has a distinct immunohistochemical profile suggesting a primary colorectal tumor as opposed to metastatic cervical cancer. CT abdomen/pelvis 01/18/2021-bilateral hydronephrosis, endometrial fluid collection, prominent stool in the colon MRI pelvis 01/23/2021-tumor at 7.6 cm from the anal verge, abnormal signal bridge in the cervix, mesorectum, and anterior aspect of the rectum-similar to MRI from 2020, MRI stage could not be defined secondary to posttreatment changes in the  pelvis 03/18/2021-total abdominal hysterectomy, cystoscopy with insertion of ureteral stents, exploratory laparotomy, aborted low anterior resection, descending loop colostomy--right and left fallopian tubes and ovaries negative for malignancy; excision portion of cervix positive for adenocarcinoma; uterus with invasive moderately differentiated adenocarcinoma of the cervix, HPV associated, invading through full-thickness of the cervix/lower uterine segment and into parametrial tissue, margins of resection received disrupted, cannot be evaluated; remaining uterus with extensive endocervical adenocarcinoma in situ involving lower uterine segment and replacing endometrium.  This carcinoma was felt to be a separate primary from the rectal tumor. PET scan at [facility] 04/11/2021-intense FDG activity in region of known rectal mass.  Hypermetabolic left external iliac and right inguinal lymph nodes.  Minimal FDG uptake and small right supraclavicular lymph node.  No uptake seen in the cervix. 04/23/2021 FNA right inguinal lymph node-negative for malignancy Evaluated by Dr. Leamon Arnt 05/21/2021, 05/28/2021-appears to have 2 synchronous malignancies (cervical adenocarcinoma and rectal adenocarcinoma); further surgery which would entail pelvic exenteration not recommended by gynecologic oncology or colorectal surgery.  Full doses of radiotherapy not felt to be feasible.  Recommended 3 months of CAPOX and then repeat MRI of abdomen/pelvis, chest CT and colonoscopy. Cycle 1 Xeloda 06/13/2021 Cycle 1 FOLFOX 07/08/2021, oxaliplatin dose reduced secondary to pre-existing neuropathy Cycle 2 FOLFOX 07/29/2021, Udenyca Cycle 3 FOLFOX 08/12/2021, Udenyca Cycle 4 FOLFOX 09/02/2021, Udenyca Cycle 5 FOLFOX 09/17/2021, Udenyca CTs at [facility] 10/01/2021-unchanged findings of rectal malignancy status posttreatment.  Marked thickening of the proximal duodenal wall.  Unchanged bilateral inguinal lymph nodes which are not enlarged but avid on prior PET.   Unchanged diffuse bladder wall thickening and perivesicular stranding which may represent radiation cystitis.  New mild right lower lobe bronchial wall thickening, mucus impaction and tree-in-bud pulmonary nodules. Cycle 6 FOLFOX 10/22/2021, Udenyca CT 01/21/2022-stable soft tissue thickening at the lower rectum, slight increase in adjacent stranding no evidence of metastatic disease in the chest, abdomen, or pelvis, interval diverting loop colostomy     Bilateral hydronephrosis-secondary to urinary retention? Hematometra found on pelvic MRI 01/23/2021, evaluated by gynecologic oncology Cervical cancer November 1994, stage Ib, treated with external beam radiation and brachytherapy at [facility] Diabetes Neuropathy Chronic pain secondary to #6 Recurrent urinary tract infections History of a CVA History of hyperthyroidism G3, P3 COVID-19 infection January 2021 Admission 08/25/2021 with a urinary tract infection-culture negative Anemia secondary to chronic disease, chemotherapy, and rectal bleeding-1 unit packed red blood cells 08/26/2021 Hospital admission 09/23/2021-symptomatic anemia 16.  Admission 10/08/2021 with altered mental status, nausea/vomiting, and left foot ulcers Urine culture 10/08/2021 positive for Pseudomonas aeruginosa EGD 10/12/2021-gastric and duodenal ulcers-not bleeding 17.  Admission 11/29/2021 through 12/02/2021 with COVID-pneumonia      Disposition: Ms. Haydon appears unchanged.  There is no clinical evidence of disease progression.  Plan to continue to follow with observation.  She will return for a port flush and follow-up visit in approximately 6 weeks.  We are available to see her sooner if needed.   Ned Card ANP/GNP-BC   04/10/2022  2:40 PM

## 2022-04-11 ENCOUNTER — Other Ambulatory Visit: Payer: Self-pay | Admitting: Nurse Practitioner

## 2022-04-11 DIAGNOSIS — C20 Malignant neoplasm of rectum: Secondary | ICD-10-CM

## 2022-04-18 ENCOUNTER — Other Ambulatory Visit: Payer: Self-pay | Admitting: Oncology

## 2022-04-18 DIAGNOSIS — C20 Malignant neoplasm of rectum: Secondary | ICD-10-CM

## 2022-04-24 ENCOUNTER — Other Ambulatory Visit: Payer: Self-pay | Admitting: Oncology

## 2022-05-14 ENCOUNTER — Other Ambulatory Visit: Payer: Self-pay | Admitting: Nurse Practitioner

## 2022-05-14 DIAGNOSIS — C20 Malignant neoplasm of rectum: Secondary | ICD-10-CM

## 2022-05-19 ENCOUNTER — Inpatient Hospital Stay: Payer: Medicare HMO

## 2022-05-19 ENCOUNTER — Inpatient Hospital Stay: Payer: Medicare HMO | Attending: Nurse Practitioner

## 2022-05-19 ENCOUNTER — Inpatient Hospital Stay: Payer: Medicare HMO | Admitting: Oncology

## 2022-05-19 VITALS — BP 134/60 | HR 70 | Temp 98.1°F | Resp 18 | Ht 60.0 in | Wt 139.0 lb

## 2022-05-19 DIAGNOSIS — Z8673 Personal history of transient ischemic attack (TIA), and cerebral infarction without residual deficits: Secondary | ICD-10-CM | POA: Diagnosis not present

## 2022-05-19 DIAGNOSIS — Z95828 Presence of other vascular implants and grafts: Secondary | ICD-10-CM

## 2022-05-19 DIAGNOSIS — Z85048 Personal history of other malignant neoplasm of rectum, rectosigmoid junction, and anus: Secondary | ICD-10-CM | POA: Diagnosis not present

## 2022-05-19 DIAGNOSIS — C20 Malignant neoplasm of rectum: Secondary | ICD-10-CM | POA: Diagnosis not present

## 2022-05-19 LAB — CMP (CANCER CENTER ONLY)
ALT: 5 U/L (ref 0–44)
AST: 14 U/L — ABNORMAL LOW (ref 15–41)
Albumin: 4.2 g/dL (ref 3.5–5.0)
Alkaline Phosphatase: 113 U/L (ref 38–126)
Anion gap: 11 (ref 5–15)
BUN: 23 mg/dL (ref 8–23)
CO2: 25 mmol/L (ref 22–32)
Calcium: 9.4 mg/dL (ref 8.9–10.3)
Chloride: 106 mmol/L (ref 98–111)
Creatinine: 0.99 mg/dL (ref 0.44–1.00)
GFR, Estimated: 59 mL/min — ABNORMAL LOW (ref >60)
Glucose, Bld: 161 mg/dL — ABNORMAL HIGH (ref 70–99)
Potassium: 5.5 mmol/L — ABNORMAL HIGH (ref 3.5–5.1)
Sodium: 142 mmol/L (ref 135–145)
Total Bilirubin: 0.4 mg/dL (ref 0.3–1.2)
Total Protein: 6.8 g/dL (ref 6.5–8.1)

## 2022-05-19 LAB — CBC WITH DIFFERENTIAL (CANCER CENTER ONLY)
Abs Immature Granulocytes: 0.01 10*3/uL (ref 0.00–0.07)
Basophils Absolute: 0 10*3/uL (ref 0.0–0.1)
Basophils Relative: 0 %
Eosinophils Absolute: 0.1 10*3/uL (ref 0.0–0.5)
Eosinophils Relative: 2 %
HCT: 34.6 % — ABNORMAL LOW (ref 36.0–46.0)
Hemoglobin: 10.4 g/dL — ABNORMAL LOW (ref 12.0–15.0)
Immature Granulocytes: 0 %
Lymphocytes Relative: 21 %
Lymphs Abs: 1 10*3/uL (ref 0.7–4.0)
MCH: 28.4 pg (ref 26.0–34.0)
MCHC: 30.1 g/dL (ref 30.0–36.0)
MCV: 94.5 fL (ref 80.0–100.0)
Monocytes Absolute: 0.4 10*3/uL (ref 0.1–1.0)
Monocytes Relative: 8 %
Neutro Abs: 3.2 10*3/uL (ref 1.7–7.7)
Neutrophils Relative %: 69 %
Platelet Count: 151 10*3/uL (ref 150–400)
RBC: 3.66 MIL/uL — ABNORMAL LOW (ref 3.87–5.11)
RDW: 14.6 % (ref 11.5–15.5)
WBC Count: 4.7 10*3/uL (ref 4.0–10.5)
nRBC: 0 % (ref 0.0–0.2)

## 2022-05-19 LAB — MAGNESIUM: Magnesium: 1.5 mg/dL — ABNORMAL LOW (ref 1.7–2.4)

## 2022-05-19 MED ORDER — SODIUM CHLORIDE 0.9% FLUSH
10.0000 mL | INTRAVENOUS | Status: DC | PRN
  Administered 2022-05-19: 10 mL via INTRAVENOUS

## 2022-05-19 MED ORDER — HEPARIN SOD (PORK) LOCK FLUSH 100 UNIT/ML IV SOLN
500.0000 [IU] | Freq: Once | INTRAVENOUS | Status: AC
  Administered 2022-05-19: 500 [IU] via INTRAVENOUS

## 2022-05-19 NOTE — Patient Instructions (Signed)
Implanted [facility] Guide An implanted port is a device that is placed under the skin. It is usually placed in the chest. The device may vary based on the need. Implanted ports can be used to give IV medicine, to take blood, or to give fluids. You may have an implanted port if: You need IV medicine that would be irritating to the small veins in your hands or arms. You need IV medicines, such as chemotherapy, for a long period of time. You need IV nutrition for a long period of time. You may have fewer limitations when using a port than you would if you used other types of long-term IVs. You will also likely be able to return to normal activities after your incision heals. An implanted port has two main parts: Reservoir. The reservoir is the part where a needle is inserted to give medicines or draw blood. The reservoir is round. After the port is placed, it appears as a small, raised area under your skin. Catheter. The catheter is a small, thin tube that connects the reservoir to a vein. Medicine that is inserted into the reservoir goes into the catheter and then into the vein. How is my port accessed? To access your port: A numbing cream may be placed on the skin over the port site. Your health care provider will put on a mask and sterile gloves. The skin over your port will be cleaned carefully with a germ-killing soap and allowed to dry. Your health care provider will gently pinch the port and insert a needle into it. Your health care provider will check for a blood return to make sure the port is in the vein and is still working (patent). If your port needs to remain accessed to get medicine continuously (constant infusion), your health care provider will place a clear bandage (dressing) over the needle site. The dressing and needle will need to be changed every week, or as told by your health care provider. What is flushing? Flushing helps keep the port working. Follow instructions from your  health care provider about how and when to flush the port. Ports are usually flushed with saline solution or a medicine called heparin. The need for flushing will depend on how the port is used: If the port is only used from time to time to give medicines or draw blood, the port may need to be flushed: Before and after medicines have been given. Before and after blood has been drawn. As part of routine maintenance. Flushing may be recommended every 4-6 weeks. If a constant infusion is running, the port may not need to be flushed. Throw away any syringes in a disposal container that is meant for sharp items (sharps container). You can buy a sharps container from a pharmacy, or you can make one by using an empty hard plastic bottle with a cover. How long will my port stay implanted? The port can stay in for as long as your health care provider thinks it is needed. When it is time for the port to come out, a surgery will be done to remove it. The surgery will be similar to the procedure that was done to put the port in. Follow these instructions at home: Caring for your port and port site Flush your port as told by your health care provider. If you need an infusion over several days, follow instructions from your health care provider about how to take care of your port site. Make sure you: Change your  dressing as told by your health care provider. Wash your hands with soap and water for at least 20 seconds before and after you change your dressing. If soap and water are not available, use alcohol-based hand sanitizer. Place any used dressings or infusion bags into a plastic bag. Throw that bag in the trash. Keep the dressing that covers the needle clean and dry. Do not get it wet. Do not use scissors or sharp objects near the infusion tubing. Keep any external tubes clamped, unless they are being used. Check your port site every day for signs of infection. Check for: Redness, swelling, or  pain. Fluid or blood. Warmth. Pus or a bad smell. Protect the skin around the port site. Avoid wearing bra straps that rub or irritate the site. Protect the skin around your port from seat belts. Place a soft pad over your chest if needed. Bathe or shower as told by your health care provider. The site may get wet as long as you are not actively receiving an infusion. General instructions  Return to your normal activities as told by your health care provider. Ask your health care provider what activities are safe for you. Carry a medical alert card or wear a medical alert bracelet at all times. This will let health care providers know that you have an implanted port in case of an emergency. Where to find more information [employer] Society: www.cancer.[place] of Clinical Oncology: www.cancer.net Contact a health care provider if: You have a fever or chills. You have redness, swelling, or pain at the port site. You have fluid or blood coming from your port site. Your incision feels warm to the touch. You have pus or a bad smell coming from the port site. Summary Implanted ports are usually placed in the chest for long-term IV access. Follow instructions from your health care provider about flushing the port and changing bandages (dressings). Take care of the area around your port by avoiding clothing that puts pressure on the area, and by watching for signs of infection. Protect the skin around your port from seat belts. Place a soft pad over your chest if needed. Contact a health care provider if you have a fever or you have redness, swelling, pain, fluid, or a bad smell at the port site. This information is not intended to replace advice given to you by your health care provider. Make sure you discuss any questions you have with your health care provider. Document Revised: 05/07/2021 Document Reviewed: 05/07/2021 Elsevier Patient Education  [place].

## 2022-05-19 NOTE — Progress Notes (Signed)
[name] OFFICE PROGRESS NOTE   Diagnosis: Rectal cancer  INTERVAL HISTORY:   [name] returns as scheduled.  Good appetite.  She continues to have rectal bleeding.  She has intermittent discomfort at the colostomy site.  She feels the discomfort is related to the parastomal hernia and the appliance.  She continues to feel better compared to when she was receiving chemotherapy.  Objective:  Vital signs in last 24 hours:  Blood pressure 134/60, pulse 70, temperature 98.1 F (36.7 C), temperature source Oral, resp. rate 18, height 5' (1.524 m), weight 139 lb (63 kg), SpO2 98 %.    Lymphatics: No cervical, supraclavicular, axillary, or inguinal nodes Resp: Lungs clear bilaterally Cardio: Regular rate and rhythm GI: The abdomen is soft and nontender, no hepatosplenomegaly, left lower quadrant colostomy with brown stool Vascular: The right lower leg is larger than the left side   Portacath/PICC-without erythema  Lab Results:  Lab Results  Component Value Date   WBC 4.7 05/19/2022   HGB 10.4 (L) 05/19/2022   HCT 34.6 (L) 05/19/2022   MCV 94.5 05/19/2022   PLT 151 05/19/2022   NEUTROABS 3.2 05/19/2022    CMP  Lab Results  Component Value Date   NA 142 05/19/2022   K 5.5 (H) 05/19/2022   CL 106 05/19/2022   CO2 25 05/19/2022   GLUCOSE 161 (H) 05/19/2022   BUN 23 05/19/2022   CREATININE 0.99 05/19/2022   CALCIUM 9.4 05/19/2022   PROT 6.8 05/19/2022   ALBUMIN 4.2 05/19/2022   AST 14 (L) 05/19/2022   ALT 5 05/19/2022   ALKPHOS 113 05/19/2022   BILITOT 0.4 05/19/2022   GFRNONAA 59 (L) 05/19/2022   GFRAA >60 03/29/2020    Lab Results  Component Value Date   CEA1 28.60 (H) 06/12/2021   CEA 15.60 (H) 03/03/2022    Medications: I have reviewed the patient's current medications.   Assessment/Plan: Rectal cancer Partially obstructing mass beginning at 10 cm from the anal verge on colonoscopy 01/23/2021, biopsy-adenocarcinoma, immunohistochemical  stains at [facility]CK20, CDX-2, and SATB2 positive, patchy strong staining for p16.  The rectal tumor has a distinct immunohistochemical profile suggesting a primary colorectal tumor as opposed to metastatic cervical cancer. CT abdomen/pelvis 01/18/2021-bilateral hydronephrosis, endometrial fluid collection, prominent stool in the colon MRI pelvis 01/23/2021-tumor at 7.6 cm from the anal verge, abnormal signal bridge in the cervix, mesorectum, and anterior aspect of the rectum-similar to MRI from 2020, MRI stage could not be defined secondary to posttreatment changes in the pelvis 03/18/2021-total abdominal hysterectomy, cystoscopy with insertion of ureteral stents, exploratory laparotomy, aborted low anterior resection, descending loop colostomy--right and left fallopian tubes and ovaries negative for malignancy; excision portion of cervix positive for adenocarcinoma; uterus with invasive moderately differentiated adenocarcinoma of the cervix, HPV associated, invading through full-thickness of the cervix/lower uterine segment and into parametrial tissue, margins of resection received disrupted, cannot be evaluated; remaining uterus with extensive endocervical adenocarcinoma in situ involving lower uterine segment and replacing endometrium.  This carcinoma was felt to be a separate primary from the rectal tumor. PET scan at [facility] 04/11/2021-intense FDG activity in region of known rectal mass.  Hypermetabolic left external iliac and right inguinal lymph nodes.  Minimal FDG uptake and small right supraclavicular lymph node.  No uptake seen in the cervix. 04/23/2021 FNA right inguinal lymph node-negative for malignancy Evaluated by Dr. Leamon Arnt 05/21/2021, 05/28/2021-appears to have 2 synchronous malignancies (cervical adenocarcinoma and rectal adenocarcinoma); further surgery which would entail pelvic exenteration not recommended by  gynecologic oncology or colorectal surgery.  Full doses of radiotherapy not felt to be feasible.   Recommended 3 months of CAPOX and then repeat MRI of abdomen/pelvis, chest CT and colonoscopy. Cycle 1 Xeloda 06/13/2021 Cycle 1 FOLFOX 07/08/2021, oxaliplatin dose reduced secondary to pre-existing neuropathy Cycle 2 FOLFOX 07/29/2021, Udenyca Cycle 3 FOLFOX 08/12/2021, Udenyca Cycle 4 FOLFOX 09/02/2021, Udenyca Cycle 5 FOLFOX 09/17/2021, Udenyca CTs at [facility] 10/01/2021-unchanged findings of rectal malignancy status posttreatment.  Marked thickening of the proximal duodenal wall.  Unchanged bilateral inguinal lymph nodes which are not enlarged but avid on prior PET.  Unchanged diffuse bladder wall thickening and perivesicular stranding which may represent radiation cystitis.  New mild right lower lobe bronchial wall thickening, mucus impaction and tree-in-bud pulmonary nodules. Cycle 6 FOLFOX 10/22/2021, Udenyca CT 01/21/2022-stable soft tissue thickening at the lower rectum, slight increase in adjacent stranding no evidence of metastatic disease in the chest, abdomen, or pelvis, interval diverting loop colostomy     Bilateral hydronephrosis-secondary to urinary retention? Hematometra found on pelvic MRI 01/23/2021, evaluated by gynecologic oncology Cervical cancer November 1994, stage Ib, treated with external beam radiation and brachytherapy at [facility] Diabetes Neuropathy Chronic pain secondary to #6 Recurrent urinary tract infections History of a CVA History of hyperthyroidism G3, P3 COVID-19 infection January 2021 Admission 08/25/2021 with a urinary tract infection-culture negative Anemia secondary to chronic disease, chemotherapy, and rectal bleeding-1 unit packed red blood cells 08/26/2021 Hospital admission 09/23/2021-symptomatic anemia 16.  Admission 10/08/2021 with altered mental status, nausea/vomiting, and left foot ulcers Urine culture 10/08/2021 positive for Pseudomonas aeruginosa EGD 10/12/2021-gastric and duodenal ulcers-not bleeding 17.  Admission 11/29/2021 through 12/02/2021 with  COVID-pneumonia        Disposition: [name] appears stable.  There is no clinical evidence for progression of the rectal cancer.  The bleeding is likely from tumor in the rectum.  The hemoglobin is higher over the past several months.  She has a history of hyperkalemia.  She will discontinue the potassium supplement.  [name] will continue to be followed with observation.  She will return for an office and lab visit in 6 weeks.  She will call for consistent pain at the colostomy site.  We will arrange for an ostomy clinic visit as needed.  Betsy Coder, MD  05/19/2022  4:02 PM

## 2022-05-24 ENCOUNTER — Other Ambulatory Visit: Payer: Self-pay | Admitting: Oncology

## 2022-06-10 ENCOUNTER — Encounter (HOSPITAL_COMMUNITY): Payer: Self-pay | Admitting: Nurse Practitioner

## 2022-06-27 ENCOUNTER — Inpatient Hospital Stay: Payer: Medicare HMO | Attending: Nurse Practitioner

## 2022-06-27 ENCOUNTER — Encounter: Payer: Self-pay | Admitting: Nurse Practitioner

## 2022-06-27 ENCOUNTER — Other Ambulatory Visit: Payer: Self-pay | Admitting: Oncology

## 2022-06-27 ENCOUNTER — Inpatient Hospital Stay: Payer: Medicare HMO

## 2022-06-27 ENCOUNTER — Inpatient Hospital Stay (HOSPITAL_BASED_OUTPATIENT_CLINIC_OR_DEPARTMENT_OTHER): Payer: Medicare HMO | Admitting: Nurse Practitioner

## 2022-06-27 VITALS — BP 139/60 | HR 66 | Temp 98.1°F | Resp 18 | Ht 60.0 in | Wt 132.0 lb

## 2022-06-27 DIAGNOSIS — C20 Malignant neoplasm of rectum: Secondary | ICD-10-CM

## 2022-06-27 DIAGNOSIS — Z85048 Personal history of other malignant neoplasm of rectum, rectosigmoid junction, and anus: Secondary | ICD-10-CM | POA: Diagnosis not present

## 2022-06-27 LAB — CBC WITH DIFFERENTIAL (CANCER CENTER ONLY)
Abs Immature Granulocytes: 0.03 10*3/uL (ref 0.00–0.07)
Basophils Absolute: 0 10*3/uL (ref 0.0–0.1)
Basophils Relative: 0 %
Eosinophils Absolute: 0.1 10*3/uL (ref 0.0–0.5)
Eosinophils Relative: 2 %
HCT: 31.6 % — ABNORMAL LOW (ref 36.0–46.0)
Hemoglobin: 9.9 g/dL — ABNORMAL LOW (ref 12.0–15.0)
Immature Granulocytes: 1 %
Lymphocytes Relative: 11 %
Lymphs Abs: 0.7 10*3/uL (ref 0.7–4.0)
MCH: 28.5 pg (ref 26.0–34.0)
MCHC: 31.3 g/dL (ref 30.0–36.0)
MCV: 91.1 fL (ref 80.0–100.0)
Monocytes Absolute: 0.3 10*3/uL (ref 0.1–1.0)
Monocytes Relative: 4 %
Neutro Abs: 5.3 10*3/uL (ref 1.7–7.7)
Neutrophils Relative %: 82 %
Platelet Count: 252 10*3/uL (ref 150–400)
RBC: 3.47 MIL/uL — ABNORMAL LOW (ref 3.87–5.11)
RDW: 14.2 % (ref 11.5–15.5)
WBC Count: 6.4 10*3/uL (ref 4.0–10.5)
nRBC: 0 % (ref 0.0–0.2)

## 2022-06-27 LAB — CMP (CANCER CENTER ONLY)
ALT: 12 U/L (ref 0–44)
AST: 15 U/L (ref 15–41)
Albumin: 3.7 g/dL (ref 3.5–5.0)
Alkaline Phosphatase: 117 U/L (ref 38–126)
Anion gap: 10 (ref 5–15)
BUN: 12 mg/dL (ref 8–23)
CO2: 27 mmol/L (ref 22–32)
Calcium: 9 mg/dL (ref 8.9–10.3)
Chloride: 104 mmol/L (ref 98–111)
Creatinine: 0.94 mg/dL (ref 0.44–1.00)
GFR, Estimated: >60 mL/min (ref >60)
Glucose, Bld: 190 mg/dL — ABNORMAL HIGH (ref 70–99)
Potassium: 4.7 mmol/L (ref 3.5–5.1)
Sodium: 141 mmol/L (ref 135–145)
Total Bilirubin: 0.4 mg/dL (ref 0.3–1.2)
Total Protein: 7 g/dL (ref 6.5–8.1)

## 2022-06-27 LAB — MAGNESIUM: Magnesium: 1.3 mg/dL — ABNORMAL LOW (ref 1.7–2.4)

## 2022-06-27 NOTE — Progress Notes (Signed)
[place] OFFICE PROGRESS NOTE   Diagnosis: Rectal cancer  INTERVAL HISTORY:   [name] returns as scheduled.  Overall she is doing well.  She denies rectal plane.  No recent rectal bleeding.  She has intermittent discomfort at the colostomy site.  Main complaint is recent leg cramps.  Objective:  Vital signs in last 24 hours:  Blood pressure 139/60, pulse 66, temperature 98.1 F (36.7 C), temperature source Oral, resp. rate 18, height 5' (1.524 m), weight 132 lb (59.9 kg), SpO2 96 %.    HEENT: No thrush or ulcers. Lymphatics: No palpable cervical, supraclavicular, axillary or inguinal lymph nodes. Resp: Lungs clear bilaterally. Cardio: Regular rate and rhythm. GI: No hepatomegaly.  Left lower quadrant colostomy. Vascular: No leg edema. Port-A-Cath without erythema.  Lab Results:  Lab Results  Component Value Date   WBC 6.4 06/27/2022   HGB 9.9 (L) 06/27/2022   HCT 31.6 (L) 06/27/2022   MCV 91.1 06/27/2022   PLT 252 06/27/2022   NEUTROABS 5.3 06/27/2022    Imaging:  No results found.  Medications: I have reviewed the patient's current medications.  Assessment/Plan: Rectal cancer Partially obstructing mass beginning at 10 cm from the anal verge on colonoscopy 01/23/2021, biopsy-adenocarcinoma, immunohistochemical stains at [facility]20, CDX-2, and SATB2 positive, patchy strong staining for p16.  The rectal tumor has a distinct immunohistochemical profile suggesting a primary colorectal tumor as opposed to metastatic cervical cancer. CT abdomen/pelvis 01/18/2021-bilateral hydronephrosis, endometrial fluid collection, prominent stool in the colon MRI pelvis 01/23/2021-tumor at 7.6 cm from the anal verge, abnormal signal bridge in the cervix, mesorectum, and anterior aspect of the rectum-similar to MRI from 2020, MRI stage could not be defined secondary to posttreatment changes in the pelvis 03/18/2021-total abdominal hysterectomy, cystoscopy with insertion of  ureteral stents, exploratory laparotomy, aborted low anterior resection, descending loop colostomy--right and left fallopian tubes and ovaries negative for malignancy; excision portion of cervix positive for adenocarcinoma; uterus with invasive moderately differentiated adenocarcinoma of the cervix, HPV associated, invading through full-thickness of the cervix/lower uterine segment and into parametrial tissue, margins of resection received disrupted, cannot be evaluated; remaining uterus with extensive endocervical adenocarcinoma in situ involving lower uterine segment and replacing endometrium.  This carcinoma was felt to be a separate primary from the rectal tumor. PET scan at [facility] 04/11/2021-intense FDG activity in region of known rectal mass.  Hypermetabolic left external iliac and right inguinal lymph nodes.  Minimal FDG uptake and small right supraclavicular lymph node.  No uptake seen in the cervix. 04/23/2021 FNA right inguinal lymph node-negative for malignancy Evaluated by Dr. Leamon Arnt 05/21/2021, 05/28/2021-appears to have 2 synchronous malignancies (cervical adenocarcinoma and rectal adenocarcinoma); further surgery which would entail pelvic exenteration not recommended by gynecologic oncology or colorectal surgery.  Full doses of radiotherapy not felt to be feasible.  Recommended 3 months of CAPOX and then repeat MRI of abdomen/pelvis, chest CT and colonoscopy. Cycle 1 Xeloda 06/13/2021 Cycle 1 FOLFOX 07/08/2021, oxaliplatin dose reduced secondary to pre-existing neuropathy Cycle 2 FOLFOX 07/29/2021, Udenyca Cycle 3 FOLFOX 08/12/2021, Udenyca Cycle 4 FOLFOX 09/02/2021, Udenyca Cycle 5 FOLFOX 09/17/2021, Udenyca CTs at [facility] 10/01/2021-unchanged findings of rectal malignancy status posttreatment.  Marked thickening of the proximal duodenal wall.  Unchanged bilateral inguinal lymph nodes which are not enlarged but avid on prior PET.  Unchanged diffuse bladder wall thickening and perivesicular stranding which  may represent radiation cystitis.  New mild right lower lobe bronchial wall thickening, mucus impaction and tree-in-bud pulmonary nodules. Cycle 6 FOLFOX 10/22/2021, Ellen Henri  CT 01/21/2022-stable soft tissue thickening at the lower rectum, slight increase in adjacent stranding no evidence of metastatic disease in the chest, abdomen, or pelvis, interval diverting loop colostomy     Bilateral hydronephrosis-secondary to urinary retention? Hematometra found on pelvic MRI 01/23/2021, evaluated by gynecologic oncology Cervical cancer November 1994, stage Ib, treated with external beam radiation and brachytherapy at [facility] Diabetes Neuropathy Chronic pain secondary to #6 Recurrent urinary tract infections History of a CVA History of hyperthyroidism G3, P3 COVID-19 infection January 2021 Admission 08/25/2021 with a urinary tract infection-culture negative Anemia secondary to chronic disease, chemotherapy, and rectal bleeding-1 unit packed red blood cells 08/26/2021 Hospital admission 09/23/2021-symptomatic anemia 16.  Admission 10/08/2021 with altered mental status, nausea/vomiting, and left foot ulcers Urine culture 10/08/2021 positive for Pseudomonas aeruginosa EGD 10/12/2021-gastric and duodenal ulcers-not bleeding 17.  Admission 11/29/2021 through 12/02/2021 with COVID-pneumonia  Disposition: Ms. Nims appears stable.  There is no clinical evidence for progression of the rectal cancer.    We reviewed the labs from today.  Hemoglobin is stable.  She has persistent hypomagnesemia.  The leg cramps could be related to the low magnesium.  Potassium is in normal range.  We discussed IV magnesium today.  She prefers to return for a magnesium infusion early next week.  She will return for lab and follow-up in 6 weeks.  We are available to see her sooner if needed.    Ned Card ANP/GNP-BC   06/27/2022  4:41 PM

## 2022-06-30 ENCOUNTER — Inpatient Hospital Stay: Payer: Medicare HMO

## 2022-06-30 DIAGNOSIS — C20 Malignant neoplasm of rectum: Secondary | ICD-10-CM

## 2022-06-30 MED ORDER — MAGNESIUM SULFATE 4 GM/100ML IV SOLN
4.0000 g | Freq: Once | INTRAVENOUS | Status: AC
  Administered 2022-06-30: 4 g via INTRAVENOUS
  Filled 2022-06-30: qty 100

## 2022-06-30 MED ORDER — SODIUM CHLORIDE 0.9% FLUSH
10.0000 mL | Freq: Once | INTRAVENOUS | Status: AC
  Administered 2022-06-30: 10 mL via INTRAVENOUS

## 2022-06-30 MED ORDER — HEPARIN SOD (PORK) LOCK FLUSH 100 UNIT/ML IV SOLN
500.0000 [IU] | Freq: Once | INTRAVENOUS | Status: AC
  Administered 2022-06-30: 500 [IU] via INTRAVENOUS

## 2022-06-30 MED ORDER — SODIUM CHLORIDE 0.9 % IV SOLN: INTRAVENOUS | Status: DC

## 2022-06-30 NOTE — Patient Instructions (Signed)
Implanted [facility] Guide An implanted port is a device that is placed under the skin. It is usually placed in the chest. The device may vary based on the need. Implanted ports can be used to give IV medicine, to take blood, or to give fluids. You may have an implanted port if: You need IV medicine that would be irritating to the small veins in your hands or arms. You need IV medicines, such as chemotherapy, for a long period of time. You need IV nutrition for a long period of time. You may have fewer limitations when using a port than you would if you used other types of long-term IVs. You will also likely be able to return to normal activities after your incision heals. An implanted port has two main parts: Reservoir. The reservoir is the part where a needle is inserted to give medicines or draw blood. The reservoir is round. After the port is placed, it appears as a small, raised area under your skin. Catheter. The catheter is a small, thin tube that connects the reservoir to a vein. Medicine that is inserted into the reservoir goes into the catheter and then into the vein. How is my port accessed? To access your port: A numbing cream may be placed on the skin over the port site. Your health care provider will put on a mask and sterile gloves. The skin over your port will be cleaned carefully with a germ-killing soap and allowed to dry. Your health care provider will gently pinch the port and insert a needle into it. Your health care provider will check for a blood return to make sure the port is in the vein and is still working (patent). If your port needs to remain accessed to get medicine continuously (constant infusion), your health care provider will place a clear bandage (dressing) over the needle site. The dressing and needle will need to be changed every week, or as told by your health care provider. What is flushing? Flushing helps keep the port working. Follow instructions from your  health care provider about how and when to flush the port. Ports are usually flushed with saline solution or a medicine called heparin. The need for flushing will depend on how the port is used: If the port is only used from time to time to give medicines or draw blood, the port may need to be flushed: Before and after medicines have been given. Before and after blood has been drawn. As part of routine maintenance. Flushing may be recommended every 4-6 weeks. If a constant infusion is running, the port may not need to be flushed. Throw away any syringes in a disposal container that is meant for sharp items (sharps container). You can buy a sharps container from a pharmacy, or you can make one by using an empty hard plastic bottle with a cover. How long will my port stay implanted? The port can stay in for as long as your health care provider thinks it is needed. When it is time for the port to come out, a surgery will be done to remove it. The surgery will be similar to the procedure that was done to put the port in. Follow these instructions at home: Caring for your port and port site Flush your port as told by your health care provider. If you need an infusion over several days, follow instructions from your health care provider about how to take care of your port site. Make sure you: Change your  dressing as told by your health care provider. Wash your hands with soap and water for at least 20 seconds before and after you change your dressing. If soap and water are not available, use alcohol-based hand sanitizer. Place any used dressings or infusion bags into a plastic bag. Throw that bag in the trash. Keep the dressing that covers the needle clean and dry. Do not get it wet. Do not use scissors or sharp objects near the infusion tubing. Keep any external tubes clamped, unless they are being used. Check your port site every day for signs of infection. Check for: Redness, swelling, or  pain. Fluid or blood. Warmth. Pus or a bad smell. Protect the skin around the port site. Avoid wearing bra straps that rub or irritate the site. Protect the skin around your port from seat belts. Place a soft pad over your chest if needed. Bathe or shower as told by your health care provider. The site may get wet as long as you are not actively receiving an infusion. General instructions  Return to your normal activities as told by your health care provider. Ask your health care provider what activities are safe for you. Carry a medical alert card or wear a medical alert bracelet at all times. This will let health care providers know that you have an implanted port in case of an emergency. Where to find more information American Cancer Society: www.cancer.[place] of Clinical Oncology: www.cancer.net Contact a health care provider if: You have a fever or chills. You have redness, swelling, or pain at the port site. You have fluid or blood coming from your port site. Your incision feels warm to the touch. You have pus or a bad smell coming from the port site. Summary Implanted ports are usually placed in the chest for long-term IV access. Follow instructions from your health care provider about flushing the port and changing bandages (dressings). Take care of the area around your port by avoiding clothing that puts pressure on the area, and by watching for signs of infection. Protect the skin around your port from seat belts. Place a soft pad over your chest if needed. Contact a health care provider if you have a fever or you have redness, swelling, pain, fluid, or a bad smell at the port site. This information is not intended to replace advice given to you by your health care provider. Make sure you discuss any questions you have with your health care provider. Document Revised: 05/07/2021 Document Reviewed: 05/07/2021 Elsevier Patient Education  [place].  Hypomagnesemia Hypomagnesemia is a condition in which the level of magnesium in the blood is too low. Magnesium is a mineral that is found in many foods. It is used in many different processes in the body. Hypomagnesemia can affect every organ in the body. In severe cases, it can cause life-threatening problems. What are the causes? This condition may be caused by: Not getting enough magnesium in your diet or not having enough healthy foods to eat (malnutrition). Problems with magnesium absorption in the intestines. Dehydration. Excessive use of alcohol. Vomiting. Severe or long-term (chronic) diarrhea. Some medicines, including medicines that make you urinate more often (diuretics). Certain diseases, such as kidney disease, diabetes, celiac disease, and overactive thyroid. What are the signs or symptoms? Symptoms of this condition include: Loss of appetite, nausea, and vomiting. Involuntary shaking or trembling of a body part (tremor). Muscle weakness or tingling in the arms and legs. Sudden tightening of muscles (muscle  spasms). Confusion. Psychiatric issues, such as: Depression and irritability. Psychosis. A feeling of fluttering of the heart (palpitations). Seizures. These symptoms are more severe if magnesium levels drop suddenly. How is this diagnosed? This condition may be diagnosed based on: Your symptoms and medical history. A physical exam. Blood and urine tests. How is this treated? Treatment depends on the cause and the severity of the condition. It may be treated by: Taking a magnesium supplement. This can be taken in pill form. If the condition is severe, magnesium is usually given through an IV. Making changes to your diet. You may be directed to eat foods that have a lot of magnesium, such as green leafy vegetables, peas, beans, and nuts. Not drinking alcohol. If you are struggling not to drink, ask your health care provider for help. Follow these  instructions at home: Eating and drinking     Make sure that your diet includes foods with magnesium. Foods that have a lot of magnesium in them include: Green leafy vegetables, such as spinach and broccoli. Beans and peas. Nuts and seeds, such as almonds and sunflower seeds. Whole grains, such as whole grain bread and fortified cereals. Drink fluids that contain salts and minerals (electrolytes), such as sports drinks, when you are active. Do not drink alcohol. General instructions Take over-the-counter and prescription medicines only as told by your health care provider. Take magnesium supplements as directed if your health care provider tells you to take them. Have your magnesium levels monitored as told by your health care provider. Keep all follow-up visits. This is important. Contact a health care provider if: You get worse instead of better. Your symptoms return. Get help right away if: You develop severe muscle weakness. You have trouble breathing. You feel that your heart is racing. These symptoms may represent a serious problem that is an emergency. Do not wait to see if the symptoms will go away. Get medical help right away. Call your local emergency services (911 in the [place] Do not drive yourself to the hospital. Summary Hypomagnesemia is a condition in which the level of magnesium in the blood is too low. Hypomagnesemia can affect every organ in the body. Treatment may include eating more foods that contain magnesium, taking magnesium supplements, and not drinking alcohol. Have your magnesium levels monitored as told by your health care provider. This information is not intended to replace advice given to you by your health care provider. Make sure you discuss any questions you have with your health care provider. Document Revised: 04/02/2021 Document Reviewed: 04/02/2021 Elsevier Patient Education  [place].

## 2022-07-02 ENCOUNTER — Ambulatory Visit: Payer: Medicare HMO | Admitting: Podiatry

## 2022-08-03 ENCOUNTER — Other Ambulatory Visit: Payer: Self-pay | Admitting: Oncology

## 2022-08-13 ENCOUNTER — Other Ambulatory Visit: Payer: Self-pay | Admitting: Physician Assistant

## 2022-08-14 ENCOUNTER — Inpatient Hospital Stay: Payer: Medicare HMO | Admitting: Oncology

## 2022-08-14 ENCOUNTER — Other Ambulatory Visit (HOSPITAL_BASED_OUTPATIENT_CLINIC_OR_DEPARTMENT_OTHER): Payer: Self-pay

## 2022-08-14 ENCOUNTER — Inpatient Hospital Stay: Payer: Medicare HMO

## 2022-08-14 ENCOUNTER — Other Ambulatory Visit: Payer: Self-pay

## 2022-08-14 ENCOUNTER — Inpatient Hospital Stay: Payer: Medicare HMO | Attending: Nurse Practitioner

## 2022-08-14 VITALS — BP 154/54 | HR 60 | Temp 98.1°F | Resp 18 | Ht 60.0 in | Wt 140.0 lb

## 2022-08-14 DIAGNOSIS — C20 Malignant neoplasm of rectum: Secondary | ICD-10-CM | POA: Diagnosis not present

## 2022-08-14 DIAGNOSIS — R32 Unspecified urinary incontinence: Secondary | ICD-10-CM | POA: Diagnosis not present

## 2022-08-14 LAB — CBC WITH DIFFERENTIAL (CANCER CENTER ONLY)
Abs Immature Granulocytes: 0.01 10*3/uL (ref 0.00–0.07)
Basophils Absolute: 0 10*3/uL (ref 0.0–0.1)
Basophils Relative: 1 %
Eosinophils Absolute: 0.1 10*3/uL (ref 0.0–0.5)
Eosinophils Relative: 2 %
HCT: 34.7 % — ABNORMAL LOW (ref 36.0–46.0)
Hemoglobin: 10.7 g/dL — ABNORMAL LOW (ref 12.0–15.0)
Immature Granulocytes: 0 %
Lymphocytes Relative: 21 %
Lymphs Abs: 0.8 10*3/uL (ref 0.7–4.0)
MCH: 29.2 pg (ref 26.0–34.0)
MCHC: 30.8 g/dL (ref 30.0–36.0)
MCV: 94.6 fL (ref 80.0–100.0)
Monocytes Absolute: 0.3 10*3/uL (ref 0.1–1.0)
Monocytes Relative: 8 %
Neutro Abs: 2.6 10*3/uL (ref 1.7–7.7)
Neutrophils Relative %: 68 %
Platelet Count: 122 10*3/uL — ABNORMAL LOW (ref 150–400)
RBC: 3.67 MIL/uL — ABNORMAL LOW (ref 3.87–5.11)
RDW: 15.1 % (ref 11.5–15.5)
WBC Count: 3.8 10*3/uL — ABNORMAL LOW (ref 4.0–10.5)
nRBC: 0 % (ref 0.0–0.2)

## 2022-08-14 LAB — CMP (CANCER CENTER ONLY)
ALT: <5 U/L (ref 0–44)
AST: 13 U/L — ABNORMAL LOW (ref 15–41)
Albumin: 4 g/dL (ref 3.5–5.0)
Alkaline Phosphatase: 124 U/L (ref 38–126)
Anion gap: 10 (ref 5–15)
BUN: 25 mg/dL — ABNORMAL HIGH (ref 8–23)
CO2: 24 mmol/L (ref 22–32)
Calcium: 9.1 mg/dL (ref 8.9–10.3)
Chloride: 110 mmol/L (ref 98–111)
Creatinine: 1 mg/dL (ref 0.44–1.00)
GFR, Estimated: 58 mL/min — ABNORMAL LOW (ref >60)
Glucose, Bld: 175 mg/dL — ABNORMAL HIGH (ref 70–99)
Potassium: 3.9 mmol/L (ref 3.5–5.1)
Sodium: 144 mmol/L (ref 135–145)
Total Bilirubin: 0.4 mg/dL (ref 0.3–1.2)
Total Protein: 6.6 g/dL (ref 6.5–8.1)

## 2022-08-14 LAB — MAGNESIUM: Magnesium: 1.2 mg/dL — ABNORMAL LOW (ref 1.7–2.4)

## 2022-08-14 MED ORDER — INFLUENZA VAC A&B SA ADJ QUAD 0.5 ML IM PRSY
PREFILLED_SYRINGE | INTRAMUSCULAR | 0 refills | Status: DC
  Filled 2022-08-14: qty 0.5, 1d supply, fill #0

## 2022-08-14 NOTE — Progress Notes (Signed)
[facility] OFFICE PROGRESS NOTE   Diagnosis: Rectal cancer  INTERVAL HISTORY:   [name] returns as scheduled.  She feels well.  Good appetite.  She has discomfort related to a "hernia "at the ostomy site.  Intermittent rectal bleeding.  She is not taking magnesium.  She has urinary incontinence.  Objective:  Vital signs in last 24 hours:  Blood pressure (!) 154/54, pulse 60, temperature 98.1 F (36.7 C), temperature source Oral, resp. rate 18, height 5' (1.524 m), weight 140 lb (63.5 kg), SpO2 100 %.    Lymphatics: No cervical, supraclavicular, axillary, or inguinal nodes Resp: Lungs clear bilaterally Cardio: Regular rate and rhythm GI: No hepatosplenomegaly, left lower quadrant colostomy, no mass Vascular: Trace edema throughout the right lower leg   Portacath/PICC-without erythema  Lab Results:  Lab Results  Component Value Date   WBC 3.8 (L) 08/14/2022   HGB 10.7 (L) 08/14/2022   HCT 34.7 (L) 08/14/2022   MCV 94.6 08/14/2022   PLT 122 (L) 08/14/2022   NEUTROABS 2.6 08/14/2022    CMP  Lab Results  Component Value Date   NA 144 08/14/2022   K 3.9 08/14/2022   CL 110 08/14/2022   CO2 24 08/14/2022   GLUCOSE 175 (H) 08/14/2022   BUN 25 (H) 08/14/2022   CREATININE 1.00 08/14/2022   CALCIUM 9.1 08/14/2022   PROT 6.6 08/14/2022   ALBUMIN 4.0 08/14/2022   AST 13 (L) 08/14/2022   ALT <5 08/14/2022   ALKPHOS 124 08/14/2022   BILITOT 0.4 08/14/2022   GFRNONAA 58 (L) 08/14/2022   GFRAA >60 03/29/2020    Lab Results  Component Value Date   CEA1 28.60 (H) 06/12/2021   CEA 15.60 (H) 03/03/2022    Medications: I have reviewed the patient's current medications.   Assessment/Plan:  Rectal cancer Partially obstructing mass beginning at 10 cm from the anal verge on colonoscopy 01/23/2021, biopsy-adenocarcinoma, immunohistochemical stains at [facility]-CK20, CDX-2, and SATB2 positive, patchy strong staining for p16.  The rectal tumor has a distinct  immunohistochemical profile suggesting a primary colorectal tumor as opposed to metastatic cervical cancer. CT abdomen/pelvis 01/18/2021-bilateral hydronephrosis, endometrial fluid collection, prominent stool in the colon MRI pelvis 01/23/2021-tumor at 7.6 cm from the anal verge, abnormal signal bridge in the cervix, mesorectum, and anterior aspect of the rectum-similar to MRI from 2020, MRI stage could not be defined secondary to posttreatment changes in the pelvis 03/18/2021-total abdominal hysterectomy, cystoscopy with insertion of ureteral stents, exploratory laparotomy, aborted low anterior resection, descending loop colostomy--right and left fallopian tubes and ovaries negative for malignancy; excision portion of cervix positive for adenocarcinoma; uterus with invasive moderately differentiated adenocarcinoma of the cervix, HPV associated, invading through full-thickness of the cervix/lower uterine segment and into parametrial tissue, margins of resection received disrupted, cannot be evaluated; remaining uterus with extensive endocervical adenocarcinoma in situ involving lower uterine segment and replacing endometrium.  This carcinoma was felt to be a separate primary from the rectal tumor. PET scan at [facility] 04/11/2021-intense FDG activity in region of known rectal mass.  Hypermetabolic left external iliac and right inguinal lymph nodes.  Minimal FDG uptake and small right supraclavicular lymph node.  No uptake seen in the cervix. 04/23/2021 FNA right inguinal lymph node-negative for malignancy Evaluated by Dr. Leamon Arnt 05/21/2021, 05/28/2021-appears to have 2 synchronous malignancies (cervical adenocarcinoma and rectal adenocarcinoma); further surgery which would entail pelvic exenteration not recommended by gynecologic oncology or colorectal surgery.  Full doses of radiotherapy not felt to be feasible.  Recommended 3  months of CAPOX and then repeat MRI of abdomen/pelvis, chest CT and colonoscopy. Cycle 1 Xeloda  06/13/2021 Cycle 1 FOLFOX 07/08/2021, oxaliplatin dose reduced secondary to pre-existing neuropathy Cycle 2 FOLFOX 07/29/2021, Udenyca Cycle 3 FOLFOX 08/12/2021, Udenyca Cycle 4 FOLFOX 09/02/2021, Udenyca Cycle 5 FOLFOX 09/17/2021, Udenyca CTs at [facility] 10/01/2021-unchanged findings of rectal malignancy status posttreatment.  Marked thickening of the proximal duodenal wall.  Unchanged bilateral inguinal lymph nodes which are not enlarged but avid on prior PET.  Unchanged diffuse bladder wall thickening and perivesicular stranding which may represent radiation cystitis.  New mild right lower lobe bronchial wall thickening, mucus impaction and tree-in-bud pulmonary nodules. Cycle 6 FOLFOX 10/22/2021, Udenyca CT 01/21/2022-stable soft tissue thickening at the lower rectum, slight increase in adjacent stranding no evidence of metastatic disease in the chest, abdomen, or pelvis, interval diverting loop colostomy     Bilateral hydronephrosis-secondary to urinary retention? Hematometra found on pelvic MRI 01/23/2021, evaluated by gynecologic oncology Cervical cancer November 1994, stage Ib, treated with external beam radiation and brachytherapy at [facility] Diabetes Neuropathy Chronic pain secondary to #6 Recurrent urinary tract infections History of a CVA History of hyperthyroidism G3, P3 COVID-19 infection January 2021 Admission 08/25/2021 with a urinary tract infection-culture negative Anemia secondary to chronic disease, chemotherapy, and rectal bleeding-1 unit packed red blood cells 08/26/2021 Hospital admission 09/23/2021-symptomatic anemia 16.  Admission 10/08/2021 with altered mental status, nausea/vomiting, and left foot ulcers Urine culture 10/08/2021 positive for Pseudomonas aeruginosa EGD 10/12/2021-gastric and duodenal ulcers-not bleeding 17.  Admission 11/29/2021 through 12/02/2021 with COVID-pneumonia   Disposition: [name] appears unchanged.  There is no clinical evidence for progression of the  rectal cancer.  Remains low.  She declined IV magnesium today.  She will be scheduled for IV magnesium supplementation next week.  She will resume oral magnesium therapy. She reconfirmed her decision against further systemic therapy.  We will check a CEA when she returns in 6 weeks.  Betsy Coder, MD  08/14/2022  12:07 PM

## 2022-08-19 ENCOUNTER — Encounter: Payer: Self-pay | Admitting: Podiatry

## 2022-08-19 ENCOUNTER — Ambulatory Visit: Payer: Medicare HMO | Admitting: Podiatry

## 2022-08-19 DIAGNOSIS — B351 Tinea unguium: Secondary | ICD-10-CM

## 2022-08-19 DIAGNOSIS — L84 Corns and callosities: Secondary | ICD-10-CM | POA: Diagnosis not present

## 2022-08-19 DIAGNOSIS — M79675 Pain in left toe(s): Secondary | ICD-10-CM

## 2022-08-19 DIAGNOSIS — M79674 Pain in right toe(s): Secondary | ICD-10-CM

## 2022-08-19 DIAGNOSIS — E1142 Type 2 diabetes mellitus with diabetic polyneuropathy: Secondary | ICD-10-CM | POA: Diagnosis not present

## 2022-08-20 ENCOUNTER — Inpatient Hospital Stay: Payer: Medicare HMO | Attending: Nurse Practitioner

## 2022-08-20 VITALS — BP 139/42 | HR 52 | Temp 98.7°F | Resp 18

## 2022-08-20 DIAGNOSIS — C20 Malignant neoplasm of rectum: Secondary | ICD-10-CM | POA: Diagnosis present

## 2022-08-20 DIAGNOSIS — Z95828 Presence of other vascular implants and grafts: Secondary | ICD-10-CM

## 2022-08-20 MED ORDER — SODIUM CHLORIDE 0.9% FLUSH
10.0000 mL | Freq: Once | INTRAVENOUS | Status: AC
  Administered 2022-08-20: 10 mL via INTRAVENOUS

## 2022-08-20 MED ORDER — MAGNESIUM SULFATE 4 GM/100ML IV SOLN
4.0000 g | Freq: Once | INTRAVENOUS | Status: AC
  Administered 2022-08-20: 4 g via INTRAVENOUS
  Filled 2022-08-20: qty 100

## 2022-08-20 MED ORDER — HEPARIN SOD (PORK) LOCK FLUSH 100 UNIT/ML IV SOLN
500.0000 [IU] | Freq: Once | INTRAVENOUS | Status: AC
  Administered 2022-08-20: 500 [IU] via INTRAVENOUS

## 2022-08-20 MED ORDER — SODIUM CHLORIDE 0.9 % IV SOLN: INTRAVENOUS | Status: DC

## 2022-08-20 NOTE — Patient Instructions (Signed)
Magnesium Sulfate Injection What is this medication? MAGNESIUM SULFATE (mag NEE zee um SUL fate) prevents and treats low levels of magnesium in your body. It may also be used to prevent and treat seizures during pregnancy in people with high blood pressure disorders, such as preeclampsia or eclampsia. Magnesium plays an important role in maintaining the health of your muscles and nervous system. This medicine may be used for other purposes; ask your health care provider or pharmacist if you have questions. What should I tell my care team before I take this medication? They need to know if you have any of these conditions: Heart disease History of irregular heart beat Kidney disease An unusual or allergic reaction to magnesium sulfate, medications, foods, dyes, or preservatives Pregnant or trying to get pregnant Breast-feeding How should I use this medication? This medication is for infusion into a vein. It is given in a hospital or clinic setting. Talk to your care team about the use of this medication in children. While this medication may be prescribed for selected conditions, precautions do apply. Overdosage: If you think you have taken too much of this medicine contact a poison control center or emergency room at once. NOTE: This medicine is only for you. Do not share this medicine with others. What if I miss a dose? This does not apply. What may interact with this medication? Certain medications for anxiety or sleep Certain medications for seizures like phenobarbital Digoxin Medications that relax muscles for surgery Narcotic medications for pain This list may not describe all possible interactions. Give your health care provider a list of all the medicines, herbs, non-prescription drugs, or dietary supplements you use. Also tell them if you smoke, drink alcohol, or use illegal drugs. Some items may interact with your medicine. What should I watch for while using this medication? Your  condition will be monitored carefully while you are receiving this medication. You may need blood work done while you are receiving this medication. What side effects may I notice from receiving this medication? Side effects that you should report to your care team as soon as possible: Allergic reactions--skin rash, itching, hives, swelling of the face, lips, tongue, or throat High magnesium level--confusion, drowsiness, facial flushing, redness, sweating, muscle weakness, fast or irregular heartbeat, trouble breathing Low blood pressure--dizziness, feeling faint or lightheaded, blurry vision Side effects that usually do not require medical attention (report to your care team if they continue or are bothersome): Headache Nausea This list may not describe all possible side effects. Call your doctor for medical advice about side effects. You may report side effects to FDA at 1-800-FDA-1088. Where should I keep my medication? This medication is given in a hospital or clinic and will not be stored at home. NOTE: This sheet is a summary. It may not cover all possible information. If you have questions about this medicine, talk to your doctor, pharmacist, or health care provider.  2023 Elsevier/Gold Standard (2021-01-10 00:00:00)

## 2022-08-24 NOTE — Progress Notes (Signed)
  Subjective:  Patient ID: [name], female    DOB: 04-Jan-1946,  MRN: [id number]  [name] presents to clinic today for:  Chief Complaint  Patient presents with   foot care     [name]   New problem(s): None.   She is accompanied by her minor grandson, Ryland.  PCP is Everardo Beals, NP.  Allergies  Allergen Reactions   Codeine Nausea Only   Review of Systems: Negative except as noted in the HPI.  Objective:  [name] is a pleasant 76 y.o. female WD, WN in NAD. AAO x 3.  Vascular:  Capillary fill time to digits <3 seconds b/l lower extremities. Palpable DP pulse(s) b/l lower extremities Palpable PT pulse(s) b/l lower extremities Pedal hair absent. Lower extremity skin temperature gradient within normal limits. No pain with calf compression b/l. Trace edema noted right lower extremity with no open wounds. No warmth, no frank cellulitis.  Dermatological:  Skin warm and supple b/l lower extremities. No open wounds b/l lower extremities. No interdigital macerations b/l lower extremities. Toenails 1-5 b/l elongated, discolored, dystrophic, thickened, crumbly with subungual debris and tenderness to dorsal palpation.   Preulcerative lesion(s)  sub IPJ R hallux.  No erythema, no edema, no drainage, no fluctuance. She has minimal hyperkeratosis sub hallux IPJ of the left foot.  Musculoskeletal:  Normal muscle strength 5/5 to all lower extremity muscle groups bilaterally. No pain crepitus or joint limitation noted with ROM b/l lower extremities.   Hammertoe(s) noted to the L 2nd toe and R 2nd toe.   Neurological:  Protective sensation diminished with 10g monofilament b/l. Vibratory sensation decreased b/l.  Assessment/Plan: 1. Pain due to onychomycosis of toenails of both feet   2. Pre-ulcerative calluses   3. Diabetic peripheral neuropathy associated with type 2 diabetes mellitus ([place])     No orders of the defined types were placed in this encounter.    -Patient was evaluated and treated. All patient's and/or POA's questions/concerns answered on today's visit. -Consent given for treatment as described below: -Examined patient. -Mycotic toenails 1-5 bilaterally were debrided in length and girth with sterile nail nippers and dremel without incident. -Preulcerative lesion pared medial IPJ of right great toe utilizing sterile scalpel blade. Total number pared=1. -Patient/POA to call should there be question/concern in the interim.   Return in about 3 months (around 11/19/2022).  [name], DPM

## 2022-09-11 ENCOUNTER — Other Ambulatory Visit: Payer: Self-pay | Admitting: Physician Assistant

## 2022-09-24 ENCOUNTER — Encounter: Payer: Self-pay | Admitting: Nurse Practitioner

## 2022-09-24 ENCOUNTER — Inpatient Hospital Stay (HOSPITAL_BASED_OUTPATIENT_CLINIC_OR_DEPARTMENT_OTHER): Payer: Medicare HMO | Admitting: Nurse Practitioner

## 2022-09-24 ENCOUNTER — Inpatient Hospital Stay: Payer: Medicare HMO

## 2022-09-24 ENCOUNTER — Other Ambulatory Visit: Payer: Self-pay | Admitting: *Deleted

## 2022-09-24 ENCOUNTER — Inpatient Hospital Stay: Payer: Medicare HMO | Attending: Nurse Practitioner

## 2022-09-24 VITALS — BP 148/49 | HR 61 | Temp 98.1°F | Resp 20 | Ht 60.0 in | Wt 145.0 lb

## 2022-09-24 DIAGNOSIS — C20 Malignant neoplasm of rectum: Secondary | ICD-10-CM | POA: Insufficient documentation

## 2022-09-24 LAB — CBC WITH DIFFERENTIAL (CANCER CENTER ONLY)
Abs Immature Granulocytes: 0.01 10*3/uL (ref 0.00–0.07)
Basophils Absolute: 0 10*3/uL (ref 0.0–0.1)
Basophils Relative: 0 %
Eosinophils Absolute: 0.1 10*3/uL (ref 0.0–0.5)
Eosinophils Relative: 2 %
HCT: 33.1 % — ABNORMAL LOW (ref 36.0–46.0)
Hemoglobin: 10.2 g/dL — ABNORMAL LOW (ref 12.0–15.0)
Immature Granulocytes: 0 %
Lymphocytes Relative: 18 %
Lymphs Abs: 0.8 10*3/uL (ref 0.7–4.0)
MCH: 29.3 pg (ref 26.0–34.0)
MCHC: 30.8 g/dL (ref 30.0–36.0)
MCV: 95.1 fL (ref 80.0–100.0)
Monocytes Absolute: 0.4 10*3/uL (ref 0.1–1.0)
Monocytes Relative: 8 %
Neutro Abs: 3.1 10*3/uL (ref 1.7–7.7)
Neutrophils Relative %: 72 %
Platelet Count: 143 10*3/uL — ABNORMAL LOW (ref 150–400)
RBC: 3.48 MIL/uL — ABNORMAL LOW (ref 3.87–5.11)
RDW: 14 % (ref 11.5–15.5)
WBC Count: 4.3 10*3/uL (ref 4.0–10.5)
nRBC: 0 % (ref 0.0–0.2)

## 2022-09-24 LAB — CMP (CANCER CENTER ONLY)
ALT: 5 U/L (ref 0–44)
AST: 13 U/L — ABNORMAL LOW (ref 15–41)
Albumin: 3.8 g/dL (ref 3.5–5.0)
Alkaline Phosphatase: 124 U/L (ref 38–126)
Anion gap: 11 (ref 5–15)
BUN: 24 mg/dL — ABNORMAL HIGH (ref 8–23)
CO2: 26 mmol/L (ref 22–32)
Calcium: 9.1 mg/dL (ref 8.9–10.3)
Chloride: 105 mmol/L (ref 98–111)
Creatinine: 0.97 mg/dL (ref 0.44–1.00)
GFR, Estimated: >60 mL/min (ref >60)
Glucose, Bld: 142 mg/dL — ABNORMAL HIGH (ref 70–99)
Potassium: 4.6 mmol/L (ref 3.5–5.1)
Sodium: 142 mmol/L (ref 135–145)
Total Bilirubin: 0.4 mg/dL (ref 0.3–1.2)
Total Protein: 6.5 g/dL (ref 6.5–8.1)

## 2022-09-24 LAB — MAGNESIUM: Magnesium: 1.4 mg/dL — ABNORMAL LOW (ref 1.7–2.4)

## 2022-09-24 LAB — CEA (ACCESS): CEA (CHCC): 34.58 ng/mL — ABNORMAL HIGH (ref 0.00–5.00)

## 2022-09-24 NOTE — Progress Notes (Signed)
[place] OFFICE PROGRESS NOTE   Diagnosis: Rectal cancer  INTERVAL HISTORY:   [name] returns as scheduled.  She reports a good appetite.  Colostomy functioning well.  She denies constipation.  She continues to have periodic rectal bleeding.  No rectal pain.  She fell a few weeks ago hitting her head and right hand.  She temporarily "saw stars".  The right hand was initially swollen, bruised; now improved.  She reports intermittent double vision since the fall.  The double vision occurs when she is looking at a screen (television or her phone).  I had her look at the computer screen in the office and she did not see double.  Objective:  Vital signs in last 24 hours:  Blood pressure (!) 148/49, pulse 61, temperature 98.1 F (36.7 C), temperature source Oral, resp. rate 20, height 5' (1.524 m), weight 145 lb (65.8 kg), SpO2 100 %.    HEENT: No thrush or ulcers.  Pupils equal round and reactive to light. Lymphatics: No palpable cervical, supraclavicular, axillary or inguinal lymph nodes. Resp: Lungs clear bilaterally. Cardio: Regular rate and rhythm. GI: Abdomen soft and nontender.  No hepatosplenomegaly.  Left lower quadrant colostomy. Vascular: Trace edema throughout the right lower leg. Neuro: Alert and oriented.  Motor strength 5/5.  Extraocular movements intact.  Face is symmetric. Port-A-Cath without erythema.  Lab Results:  Lab Results  Component Value Date   WBC 4.3 09/24/2022   HGB 10.2 (L) 09/24/2022   HCT 33.1 (L) 09/24/2022   MCV 95.1 09/24/2022   PLT 143 (L) 09/24/2022   NEUTROABS 3.1 09/24/2022    Imaging:  No results found.  Medications: I have reviewed the patient's current medications.  Assessment/Plan: Rectal cancer Partially obstructing mass beginning at 10 cm from the anal verge on colonoscopy 01/23/2021, biopsy-adenocarcinoma, immunohistochemical stains at [facility], CDX-2, and SATB2 positive, patchy strong staining for p16.  The  rectal tumor has a distinct immunohistochemical profile suggesting a primary colorectal tumor as opposed to metastatic cervical cancer. CT abdomen/pelvis 01/18/2021-bilateral hydronephrosis, endometrial fluid collection, prominent stool in the colon MRI pelvis 01/23/2021-tumor at 7.6 cm from the anal verge, abnormal signal bridge in the cervix, mesorectum, and anterior aspect of the rectum-similar to MRI from 2020, MRI stage could not be defined secondary to posttreatment changes in the pelvis 03/18/2021-total abdominal hysterectomy, cystoscopy with insertion of ureteral stents, exploratory laparotomy, aborted low anterior resection, descending loop colostomy--right and left fallopian tubes and ovaries negative for malignancy; excision portion of cervix positive for adenocarcinoma; uterus with invasive moderately differentiated adenocarcinoma of the cervix, HPV associated, invading through full-thickness of the cervix/lower uterine segment and into parametrial tissue, margins of resection received disrupted, cannot be evaluated; remaining uterus with extensive endocervical adenocarcinoma in situ involving lower uterine segment and replacing endometrium.  This carcinoma was felt to be a separate primary from the rectal tumor. PET scan at [facility] 04/11/2021-intense FDG activity in region of known rectal mass.  Hypermetabolic left external iliac and right inguinal lymph nodes.  Minimal FDG uptake and small right supraclavicular lymph node.  No uptake seen in the cervix. 04/23/2021 FNA right inguinal lymph node-negative for malignancy Evaluated by Dr. Leamon Arnt 05/21/2021, 05/28/2021-appears to have 2 synchronous malignancies (cervical adenocarcinoma and rectal adenocarcinoma); further surgery which would entail pelvic exenteration not recommended by gynecologic oncology or colorectal surgery.  Full doses of radiotherapy not felt to be feasible.  Recommended 3 months of CAPOX and then repeat MRI of abdomen/pelvis, chest CT and  colonoscopy. Cycle 1  Xeloda 06/13/2021 Cycle 1 FOLFOX 07/08/2021, oxaliplatin dose reduced secondary to pre-existing neuropathy Cycle 2 FOLFOX 07/29/2021, Udenyca Cycle 3 FOLFOX 08/12/2021, Udenyca Cycle 4 FOLFOX 09/02/2021, Udenyca Cycle 5 FOLFOX 09/17/2021, Udenyca CTs at [facility] 10/01/2021-unchanged findings of rectal malignancy status posttreatment.  Marked thickening of the proximal duodenal wall.  Unchanged bilateral inguinal lymph nodes which are not enlarged but avid on prior PET.  Unchanged diffuse bladder wall thickening and perivesicular stranding which may represent radiation cystitis.  New mild right lower lobe bronchial wall thickening, mucus impaction and tree-in-bud pulmonary nodules. Cycle 6 FOLFOX 10/22/2021, Udenyca CT 01/21/2022-stable soft tissue thickening at the lower rectum, slight increase in adjacent stranding no evidence of metastatic disease in the chest, abdomen, or pelvis, interval diverting loop colostomy     Bilateral hydronephrosis-secondary to urinary retention? Hematometra found on pelvic MRI 01/23/2021, evaluated by gynecologic oncology Cervical cancer November 1994, stage Ib, treated with external beam radiation and brachytherapy at [facility] Diabetes Neuropathy Chronic pain secondary to #6 Recurrent urinary tract infections History of a CVA History of hyperthyroidism G3, P3 COVID-19 infection January 2021 Admission 08/25/2021 with a urinary tract infection-culture negative Anemia secondary to chronic disease, chemotherapy, and rectal bleeding-1 unit packed red blood cells 08/26/2021 Hospital admission 09/23/2021-symptomatic anemia 16.  Admission 10/08/2021 with altered mental status, nausea/vomiting, and left foot ulcers Urine culture 10/08/2021 positive for Pseudomonas aeruginosa EGD 10/12/2021-gastric and duodenal ulcers-not bleeding 17.  Admission 11/29/2021 through 12/02/2021 with COVID-pneumonia    Disposition: [name] appears stable.  There is no clinical  evidence for progression of the rectal cancer.  We discussed the CEA from today which is higher than the previous in April of this year.  She would like to check a CEA again at her next visit.  She confirms her decision against further systemic therapy.  We also discussed the low magnesium level from today.  She plans to resume oral magnesium.  She does not want to receive IV magnesium today but agrees to return for IV magnesium next week.  Etiology of the intermittent double vision is unclear.  She plans to schedule an appointment with her PCP today.  She will return for lab and follow-up in 6 weeks.    Ned Card ANP/GNP-BC   09/24/2022  10:37 AM

## 2022-09-29 ENCOUNTER — Inpatient Hospital Stay: Payer: Medicare HMO

## 2022-09-29 DIAGNOSIS — C20 Malignant neoplasm of rectum: Secondary | ICD-10-CM | POA: Diagnosis not present

## 2022-09-29 MED ORDER — HEPARIN SOD (PORK) LOCK FLUSH 100 UNIT/ML IV SOLN
500.0000 [IU] | Freq: Once | INTRAVENOUS | Status: AC
  Administered 2022-09-29: 500 [IU] via INTRAVENOUS

## 2022-09-29 MED ORDER — SODIUM CHLORIDE 0.9 % IV SOLN: Freq: Once | INTRAVENOUS | Status: AC

## 2022-09-29 MED ORDER — SODIUM CHLORIDE 0.9% FLUSH
10.0000 mL | Freq: Once | INTRAVENOUS | Status: AC
  Administered 2022-09-29: 10 mL via INTRAVENOUS

## 2022-09-29 MED ORDER — MAGNESIUM SULFATE 4 GM/100ML IV SOLN
4.0000 g | Freq: Once | INTRAVENOUS | Status: AC
  Administered 2022-09-29: 4 g via INTRAVENOUS
  Filled 2022-09-29: qty 100

## 2022-09-29 NOTE — Patient Instructions (Signed)
Hypomagnesemia Hypomagnesemia is a condition in which the level of magnesium in the blood is too low. Magnesium is a mineral that is found in many foods. It is used in many different processes in the body. Hypomagnesemia can affect every organ in the body. In severe cases, it can cause life-threatening problems. What are the causes? This condition may be caused by: Not getting enough magnesium in your diet or not having enough healthy foods to eat (malnutrition). Problems with magnesium absorption in the intestines. Dehydration. Excessive use of alcohol. Vomiting. Severe or long-term (chronic) diarrhea. Some medicines, including medicines that make you urinate more often (diuretics). Certain diseases, such as kidney disease, diabetes, celiac disease, and overactive thyroid. What are the signs or symptoms? Symptoms of this condition include: Loss of appetite, nausea, and vomiting. Involuntary shaking or trembling of a body part (tremor). Muscle weakness or tingling in the arms and legs. Sudden tightening of muscles (muscle spasms). Confusion. Psychiatric issues, such as: Depression and irritability. Psychosis. A feeling of fluttering of the heart (palpitations). Seizures. These symptoms are more severe if magnesium levels drop suddenly. How is this diagnosed? This condition may be diagnosed based on: Your symptoms and medical history. A physical exam. Blood and urine tests. How is this treated? Treatment depends on the cause and the severity of the condition. It may be treated by: Taking a magnesium supplement. This can be taken in pill form. If the condition is severe, magnesium is usually given through an IV. Making changes to your diet. You may be directed to eat foods that have a lot of magnesium, such as green leafy vegetables, peas, beans, and nuts. Not drinking alcohol. If you are struggling not to drink, ask your health care provider for help. Follow these instructions at  home: Eating and drinking     Make sure that your diet includes foods with magnesium. Foods that have a lot of magnesium in them include: Green leafy vegetables, such as spinach and broccoli. Beans and peas. Nuts and seeds, such as almonds and sunflower seeds. Whole grains, such as whole grain bread and fortified cereals. Drink fluids that contain salts and minerals (electrolytes), such as sports drinks, when you are active. Do not drink alcohol. General instructions Take over-the-counter and prescription medicines only as told by your health care provider. Take magnesium supplements as directed if your health care provider tells you to take them. Have your magnesium levels monitored as told by your health care provider. Keep all follow-up visits. This is important. Contact a health care provider if: You get worse instead of better. Your symptoms return. Get help right away if: You develop severe muscle weakness. You have trouble breathing. You feel that your heart is racing. These symptoms may represent a serious problem that is an emergency. Do not wait to see if the symptoms will go away. Get medical help right away. Call your local emergency services (911 in the [place] Do not drive yourself to the hospital. Summary Hypomagnesemia is a condition in which the level of magnesium in the blood is too low. Hypomagnesemia can affect every organ in the body. Treatment may include eating more foods that contain magnesium, taking magnesium supplements, and not drinking alcohol. Have your magnesium levels monitored as told by your health care provider. This information is not intended to replace advice given to you by your health care provider. Make sure you discuss any questions you have with your health care provider. Document Revised: 04/02/2021 Document Reviewed: 04/02/2021 Elsevier Patient Education    2023 Elsevier Inc.  

## 2022-10-02 ENCOUNTER — Other Ambulatory Visit: Payer: Self-pay | Admitting: Oncology

## 2022-10-28 ENCOUNTER — Ambulatory Visit: Payer: Medicare HMO | Admitting: Podiatry

## 2022-11-07 ENCOUNTER — Encounter: Payer: Self-pay | Admitting: Oncology

## 2022-11-07 ENCOUNTER — Inpatient Hospital Stay: Payer: Medicare HMO

## 2022-11-07 ENCOUNTER — Inpatient Hospital Stay: Payer: Medicare HMO | Admitting: Oncology

## 2022-11-07 ENCOUNTER — Inpatient Hospital Stay: Payer: Medicare HMO | Attending: Nurse Practitioner

## 2022-11-07 VITALS — BP 137/43 | HR 62 | Temp 97.8°F | Resp 18 | Wt 140.8 lb

## 2022-11-07 DIAGNOSIS — G893 Neoplasm related pain (acute) (chronic): Secondary | ICD-10-CM | POA: Insufficient documentation

## 2022-11-07 DIAGNOSIS — Z95828 Presence of other vascular implants and grafts: Secondary | ICD-10-CM

## 2022-11-07 DIAGNOSIS — C20 Malignant neoplasm of rectum: Secondary | ICD-10-CM | POA: Diagnosis not present

## 2022-11-07 LAB — CBC WITH DIFFERENTIAL (CANCER CENTER ONLY)
Abs Immature Granulocytes: 0.01 10*3/uL (ref 0.00–0.07)
Basophils Absolute: 0 10*3/uL (ref 0.0–0.1)
Basophils Relative: 0 %
Eosinophils Absolute: 0.1 10*3/uL (ref 0.0–0.5)
Eosinophils Relative: 2 %
HCT: 33.7 % — ABNORMAL LOW (ref 36.0–46.0)
Hemoglobin: 10.3 g/dL — ABNORMAL LOW (ref 12.0–15.0)
Immature Granulocytes: 0 %
Lymphocytes Relative: 15 %
Lymphs Abs: 0.8 10*3/uL (ref 0.7–4.0)
MCH: 28 pg (ref 26.0–34.0)
MCHC: 30.6 g/dL (ref 30.0–36.0)
MCV: 91.6 fL (ref 80.0–100.0)
Monocytes Absolute: 0.3 10*3/uL (ref 0.1–1.0)
Monocytes Relative: 6 %
Neutro Abs: 4 10*3/uL (ref 1.7–7.7)
Neutrophils Relative %: 77 %
Platelet Count: 171 10*3/uL (ref 150–400)
RBC: 3.68 MIL/uL — ABNORMAL LOW (ref 3.87–5.11)
RDW: 14.5 % (ref 11.5–15.5)
WBC Count: 5.3 10*3/uL (ref 4.0–10.5)
nRBC: 0 % (ref 0.0–0.2)

## 2022-11-07 LAB — CMP (CANCER CENTER ONLY)
ALT: 5 U/L (ref 0–44)
AST: 14 U/L — ABNORMAL LOW (ref 15–41)
Albumin: 3.9 g/dL (ref 3.5–5.0)
Alkaline Phosphatase: 119 U/L (ref 38–126)
Anion gap: 10 (ref 5–15)
BUN: 18 mg/dL (ref 8–23)
CO2: 28 mmol/L (ref 22–32)
Calcium: 9 mg/dL (ref 8.9–10.3)
Chloride: 106 mmol/L (ref 98–111)
Creatinine: 0.93 mg/dL (ref 0.44–1.00)
GFR, Estimated: >60 mL/min (ref >60)
Glucose, Bld: 107 mg/dL — ABNORMAL HIGH (ref 70–99)
Potassium: 4.7 mmol/L (ref 3.5–5.1)
Sodium: 144 mmol/L (ref 135–145)
Total Bilirubin: 0.4 mg/dL (ref 0.3–1.2)
Total Protein: 6.7 g/dL (ref 6.5–8.1)

## 2022-11-07 LAB — MAGNESIUM: Magnesium: 1.6 mg/dL — ABNORMAL LOW (ref 1.7–2.4)

## 2022-11-07 LAB — CEA (ACCESS): CEA (CHCC): 30.56 ng/mL — ABNORMAL HIGH (ref 0.00–5.00)

## 2022-11-07 MED ORDER — HEPARIN SOD (PORK) LOCK FLUSH 100 UNIT/ML IV SOLN
500.0000 [IU] | Freq: Once | INTRAVENOUS | Status: AC
  Administered 2022-11-07: 500 [IU] via INTRAVENOUS

## 2022-11-07 MED ORDER — SODIUM CHLORIDE 0.9% FLUSH
10.0000 mL | INTRAVENOUS | Status: DC | PRN
  Administered 2022-11-07: 10 mL via INTRAVENOUS

## 2022-11-07 NOTE — Progress Notes (Deleted)
[place] OFFICE PROGRESS NOTE   Diagnosis:   INTERVAL HISTORY:   ***  Objective:  Vital signs in last 24 hours:  Blood pressure (!) 137/43, pulse 62, temperature 97.8 F (36.6 C), temperature source Tympanic, resp. rate 18, weight 140 lb 12.8 oz (63.9 kg), SpO2 100 %.    HEENT: *** Lymphatics: *** Resp: *** Cardio: *** GI: *** Vascular: *** Neuro:***  Skin:***   Portacath/PICC-without erythema  Lab Results:  Lab Results  Component Value Date   WBC 5.3 11/07/2022   HGB 10.3 (L) 11/07/2022   HCT 33.7 (L) 11/07/2022   MCV 91.6 11/07/2022   PLT 171 11/07/2022   NEUTROABS 4.0 11/07/2022    CMP  Lab Results  Component Value Date   NA 144 11/07/2022   K 4.7 11/07/2022   CL 106 11/07/2022   CO2 28 11/07/2022   GLUCOSE 107 (H) 11/07/2022   BUN 18 11/07/2022   CREATININE 0.93 11/07/2022   CALCIUM 9.0 11/07/2022   PROT 6.7 11/07/2022   ALBUMIN 3.9 11/07/2022   AST 14 (L) 11/07/2022   ALT 5 11/07/2022   ALKPHOS 119 11/07/2022   BILITOT 0.4 11/07/2022   GFRNONAA >60 11/07/2022   GFRAA >60 03/29/2020    Lab Results  Component Value Date   CEA1 28.60 (H) 06/12/2021   CEA 34.58 (H) 09/24/2022    Lab Results  Component Value Date   INR 1.3 (H) 11/29/2021   LABPROT 15.9 (H) 11/29/2021    Imaging:  No results found.  Medications: I have reviewed the patient's current medications.   Assessment/Plan: Rectal cancer Partially obstructing mass beginning at 10 cm from the anal verge on colonoscopy 01/23/2021, biopsy-adenocarcinoma, immunohistochemical stains at [facility], CDX-2, and SATB2 positive, patchy strong staining for p16.  The rectal tumor has a distinct immunohistochemical profile suggesting a primary colorectal tumor as opposed to metastatic cervical cancer. CT abdomen/pelvis 01/18/2021-bilateral hydronephrosis, endometrial fluid collection, prominent stool in the colon MRI pelvis 01/23/2021-tumor at 7.6 cm from the anal verge,  abnormal signal bridge in the cervix, mesorectum, and anterior aspect of the rectum-similar to MRI from 2020, MRI stage could not be defined secondary to posttreatment changes in the pelvis 03/18/2021-total abdominal hysterectomy, cystoscopy with insertion of ureteral stents, exploratory laparotomy, aborted low anterior resection, descending loop colostomy--right and left fallopian tubes and ovaries negative for malignancy; excision portion of cervix positive for adenocarcinoma; uterus with invasive moderately differentiated adenocarcinoma of the cervix, HPV associated, invading through full-thickness of the cervix/lower uterine segment and into parametrial tissue, margins of resection received disrupted, cannot be evaluated; remaining uterus with extensive endocervical adenocarcinoma in situ involving lower uterine segment and replacing endometrium.  This carcinoma was felt to be a separate primary from the rectal tumor. PET scan at [facility] 04/11/2021-intense FDG activity in region of known rectal mass.  Hypermetabolic left external iliac and right inguinal lymph nodes.  Minimal FDG uptake and small right supraclavicular lymph node.  No uptake seen in the cervix. 04/23/2021 FNA right inguinal lymph node-negative for malignancy Evaluated by Dr. Leamon Arnt 05/21/2021, 05/28/2021-appears to have 2 synchronous malignancies (cervical adenocarcinoma and rectal adenocarcinoma); further surgery which would entail pelvic exenteration not recommended by gynecologic oncology or colorectal surgery.  Full doses of radiotherapy not felt to be feasible.  Recommended 3 months of CAPOX and then repeat MRI of abdomen/pelvis, chest CT and colonoscopy. Cycle 1 Xeloda 06/13/2021 Cycle 1 FOLFOX 07/08/2021, oxaliplatin dose reduced secondary to pre-existing neuropathy Cycle 2 FOLFOX 07/29/2021, Udenyca Cycle 3 FOLFOX 08/12/2021, Ellen Henri  Cycle 4 FOLFOX 09/02/2021, Udenyca Cycle 5 FOLFOX 09/17/2021, Udenyca CTs at [facility] 10/01/2021-unchanged findings  of rectal malignancy status posttreatment.  Marked thickening of the proximal duodenal wall.  Unchanged bilateral inguinal lymph nodes which are not enlarged but avid on prior PET.  Unchanged diffuse bladder wall thickening and perivesicular stranding which may represent radiation cystitis.  New mild right lower lobe bronchial wall thickening, mucus impaction and tree-in-bud pulmonary nodules. Cycle 6 FOLFOX 10/22/2021, Udenyca CT 01/21/2022-stable soft tissue thickening at the lower rectum, slight increase in adjacent stranding no evidence of metastatic disease in the chest, abdomen, or pelvis, interval diverting loop colostomy     Bilateral hydronephrosis-secondary to urinary retention? Hematometra found on pelvic MRI 01/23/2021, evaluated by gynecologic oncology Cervical cancer November 1994, stage Ib, treated with external beam radiation and brachytherapy at [facility] Diabetes Neuropathy Chronic pain secondary to #6 Recurrent urinary tract infections History of a CVA History of hyperthyroidism G3, P3 COVID-19 infection January 2021 Admission 08/25/2021 with a urinary tract infection-culture negative Anemia secondary to chronic disease, chemotherapy, and rectal bleeding-1 unit packed red blood cells 08/26/2021 Hospital admission 09/23/2021-symptomatic anemia 16.  Admission 10/08/2021 with altered mental status, nausea/vomiting, and left foot ulcers Urine culture 10/08/2021 positive for Pseudomonas aeruginosa EGD 10/12/2021-gastric and duodenal ulcers-not bleeding 17.  Admission 11/29/2021 through 12/02/2021 with COVID-pneumonia      Disposition: ***  Betsy Coder, MD  11/07/2022  11:54 AM

## 2022-11-07 NOTE — Progress Notes (Signed)
[place] OFFICE PROGRESS NOTE   Diagnosis: Rectal cancer  INTERVAL HISTORY:   [name] returns as scheduled.  She reports a good appetite.  No rectal pain.  She has chronic back pain.  She has intermittent rectal bleeding.  She was recently treated for a yeast infection at the stoma site. She fell and injured the left wrist approximately 1 month ago.  There is persistent swelling of the left wrist.  This has improved. She completed antibiotics for an infection.  The infection improved.  She has been referred to ENT.  Objective:  Vital signs in last 24 hours:  Blood pressure (!) 137/43, pulse 62, temperature 97.8 F (36.6 C), temperature source Tympanic, resp. rate 18, weight 140 lb 12.8 oz (63.9 kg), SpO2 100 %.    Lymphatics: No cervical, supraclavicular, axillary, or inguinal nodes Resp: Lungs clear bilaterally Cardio: Regular rate and rhythm GI: No hepatosplenomegaly, left lower quadrant colostomy with brown stool Vascular: The right lower leg is slightly larger than the left side  Skin: Faint erythema at the left greater than right groin  Portacath/PICC-without erythema  Lab Results:  Lab Results  Component Value Date   WBC 5.3 11/07/2022   HGB 10.3 (L) 11/07/2022   HCT 33.7 (L) 11/07/2022   MCV 91.6 11/07/2022   PLT 171 11/07/2022   NEUTROABS 4.0 11/07/2022    CMP  Lab Results  Component Value Date   NA 144 11/07/2022   K 4.7 11/07/2022   CL 106 11/07/2022   CO2 28 11/07/2022   GLUCOSE 107 (H) 11/07/2022   BUN 18 11/07/2022   CREATININE 0.93 11/07/2022   CALCIUM 9.0 11/07/2022   PROT 6.7 11/07/2022   ALBUMIN 3.9 11/07/2022   AST 14 (L) 11/07/2022   ALT 5 11/07/2022   ALKPHOS 119 11/07/2022   BILITOT 0.4 11/07/2022   GFRNONAA >60 11/07/2022   GFRAA >60 03/29/2020  Magnesium 1.6  Lab Results  Component Value Date   CEA1 28.60 (H) 06/12/2021   CEA 30.56 (H) 11/07/2022    Medications: I have reviewed the patient's current  medications.   Assessment/Plan: Rectal cancer Partially obstructing mass beginning at 10 cm from the anal verge on colonoscopy 01/23/2021, biopsy-adenocarcinoma, immunohistochemical stains at [facility], CDX-2, and SATB2 positive, patchy strong staining for p16.  The rectal tumor has a distinct immunohistochemical profile suggesting a primary colorectal tumor as opposed to metastatic cervical cancer. CT abdomen/pelvis 01/18/2021-bilateral hydronephrosis, endometrial fluid collection, prominent stool in the colon MRI pelvis 01/23/2021-tumor at 7.6 cm from the anal verge, abnormal signal bridge in the cervix, mesorectum, and anterior aspect of the rectum-similar to MRI from 2020, MRI stage could not be defined secondary to posttreatment changes in the pelvis 03/18/2021-total abdominal hysterectomy, cystoscopy with insertion of ureteral stents, exploratory laparotomy, aborted low anterior resection, descending loop colostomy--right and left fallopian tubes and ovaries negative for malignancy; excision portion of cervix positive for adenocarcinoma; uterus with invasive moderately differentiated adenocarcinoma of the cervix, HPV associated, invading through full-thickness of the cervix/lower uterine segment and into parametrial tissue, margins of resection received disrupted, cannot be evaluated; remaining uterus with extensive endocervical adenocarcinoma in situ involving lower uterine segment and replacing endometrium.  This carcinoma was felt to be a separate primary from the rectal tumor. PET scan at [facility] 04/11/2021-intense FDG activity in region of known rectal mass.  Hypermetabolic left external iliac and right inguinal lymph nodes.  Minimal FDG uptake and small right supraclavicular lymph node.  No uptake seen in the cervix. 04/23/2021  FNA right inguinal lymph node-negative for malignancy Evaluated by Dr. Leamon Arnt 05/21/2021, 05/28/2021-appears to have 2 synchronous malignancies (cervical adenocarcinoma and rectal  adenocarcinoma); further surgery which would entail pelvic exenteration not recommended by gynecologic oncology or colorectal surgery.  Full doses of radiotherapy not felt to be feasible.  Recommended 3 months of CAPOX and then repeat MRI of abdomen/pelvis, chest CT and colonoscopy. Cycle 1 Xeloda 06/13/2021 Cycle 1 FOLFOX 07/08/2021, oxaliplatin dose reduced secondary to pre-existing neuropathy Cycle 2 FOLFOX 07/29/2021, Udenyca Cycle 3 FOLFOX 08/12/2021, Udenyca Cycle 4 FOLFOX 09/02/2021, Udenyca Cycle 5 FOLFOX 09/17/2021, Udenyca CTs at [facility] 10/01/2021-unchanged findings of rectal malignancy status posttreatment.  Marked thickening of the proximal duodenal wall.  Unchanged bilateral inguinal lymph nodes which are not enlarged but avid on prior PET.  Unchanged diffuse bladder wall thickening and perivesicular stranding which may represent radiation cystitis.  New mild right lower lobe bronchial wall thickening, mucus impaction and tree-in-bud pulmonary nodules. Cycle 6 FOLFOX 10/22/2021, Udenyca CT 01/21/2022-stable soft tissue thickening at the lower rectum, slight increase in adjacent stranding no evidence of metastatic disease in the chest, abdomen, or pelvis, interval diverting loop colostomy     Bilateral hydronephrosis-secondary to urinary retention? Hematometra found on pelvic MRI 01/23/2021, evaluated by gynecologic oncology Cervical cancer November 1994, stage Ib, treated with external beam radiation and brachytherapy at [facility] Diabetes Neuropathy Chronic pain secondary to #6 Recurrent urinary tract infections History of a CVA History of hyperthyroidism G3, P3 COVID-19 infection January 2021 Admission 08/25/2021 with a urinary tract infection-culture negative Anemia secondary to chronic disease, chemotherapy, and rectal bleeding-1 unit packed red blood cells 08/26/2021 Hospital admission 09/23/2021-symptomatic anemia 16.  Admission 10/08/2021 with altered mental status, nausea/vomiting, and  left foot ulcers Urine culture 10/08/2021 positive for Pseudomonas aeruginosa EGD 10/12/2021-gastric and duodenal ulcers-not bleeding 17.  Admission 11/29/2021 through 12/02/2021 with COVID-pneumonia      Disposition: [name] appears stable.  There is no clinical evidence for progression of rectal cancer.  She reconfirmed her decision against further chemotherapy. She will follow-up with ENT to evaluate the right ear infection and hearing change.  I recommended she follow-up with orthopedics if the left wrist swelling does not resolve.  She will use nystatin powder as needed for the groin rash. She will continue an oral magnesium supplement. Ms. Toutant will return for an office visit and Port-A-Cath flush in 6 weeks.  Betsy Coder, MD  11/07/2022  12:11 PM

## 2022-11-11 ENCOUNTER — Other Ambulatory Visit: Payer: Self-pay

## 2022-11-11 ENCOUNTER — Telehealth: Payer: Self-pay

## 2022-11-11 DIAGNOSIS — R21 Rash and other nonspecific skin eruption: Secondary | ICD-10-CM

## 2022-11-11 MED ORDER — NYSTATIN 100000 UNIT/GM EX POWD: 1.0000 | Freq: Two times a day (BID) | CUTANEOUS | 1 refills | Status: DC

## 2022-11-11 NOTE — Telephone Encounter (Signed)
Per Dr. Benay Spice  refill her nystatin cream.

## 2022-11-21 IMAGING — CT CT ABD-PELV W/ CM
2 of 5 series · 15 of 46 positions shown, 17 images · IV contrast (OMNIPAQUE 300)
Comparison: 12/12/2019

CLINICAL DATA: Right lower quadrant abdominal pain with significant
tenderness to palpation, vomiting, history cervical cancer, kidney
stones, type II diabetes mellitus, hypertension, stroke,
appendectomy, former smoker

EXAM:
CT ABDOMEN AND PELVIS WITH CONTRAST
TECHNIQUE: Multidetector CT imaging of the abdomen and pelvis was performed
using the standard protocol following bolus administration of
intravenous contrast. Sagittal and coronal MPR images reconstructed
from axial data set.
CONTRAST:  100mL OMNIPAQUE IOHEXOL 300 MG/ML SOLN IV. No oral
contrast.

[Series 2: axial st · axial · 0.77mm/px · z∈[+1088,+1473]mm · 12 of 89 slices shown, 14 images]
[im 6/89  soft-tissue]
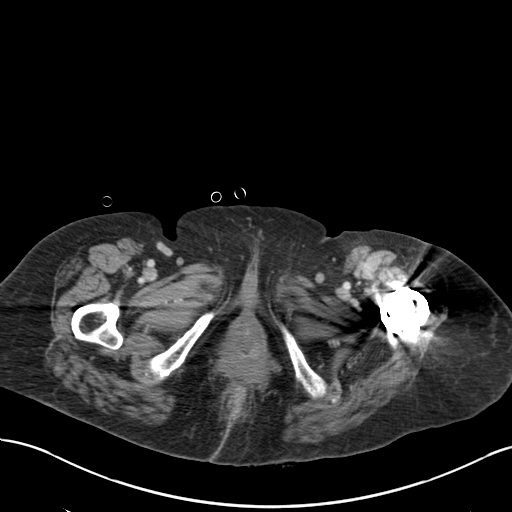
[im 6/89  bone]
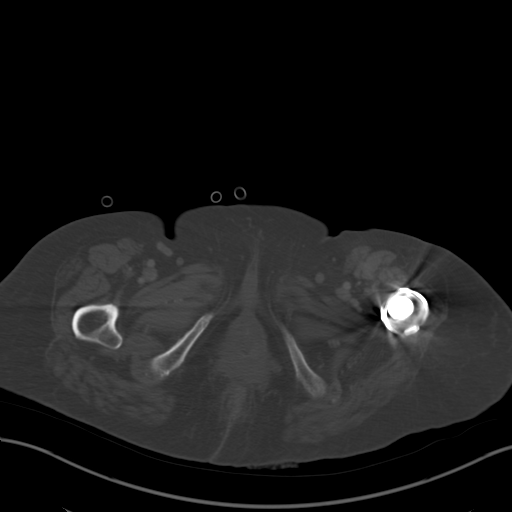
[im 12/89  soft-tissue]
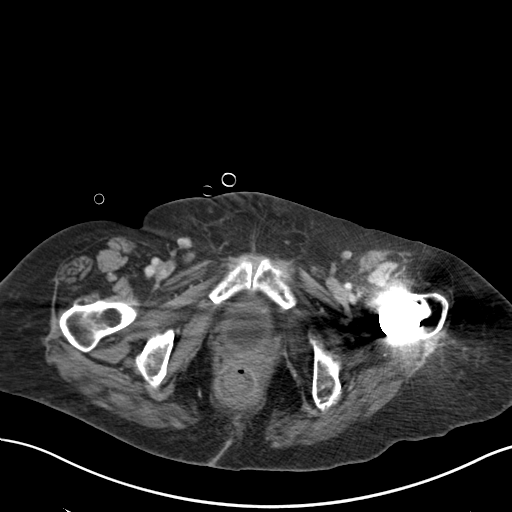
[im 18/89  soft-tissue]
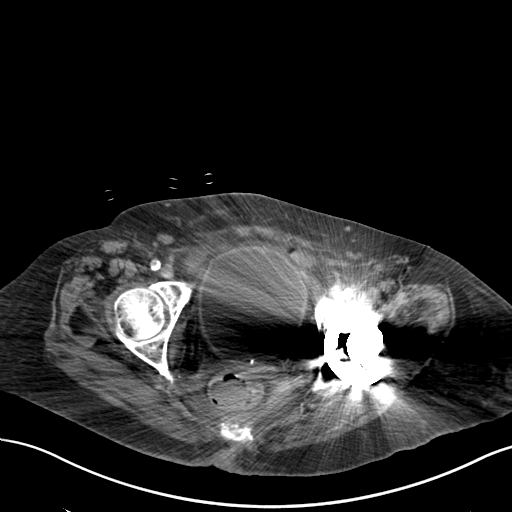
[im 30/89  soft-tissue]
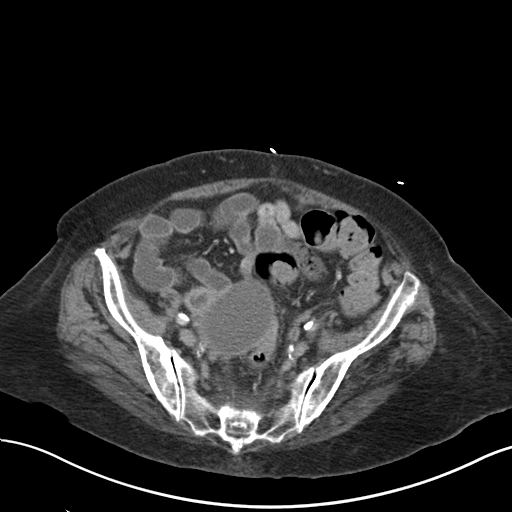
[im 36/89  soft-tissue]
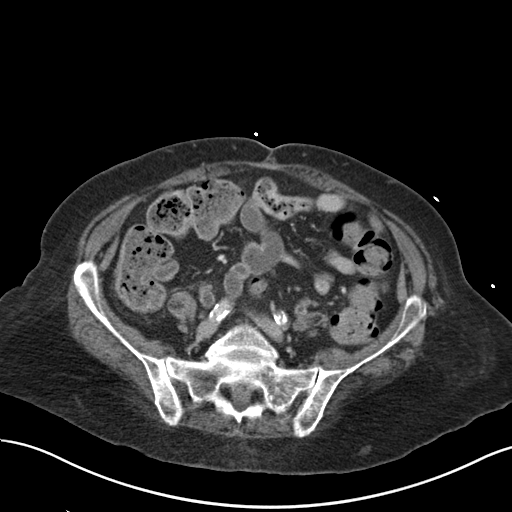
[im 42/89  soft-tissue]
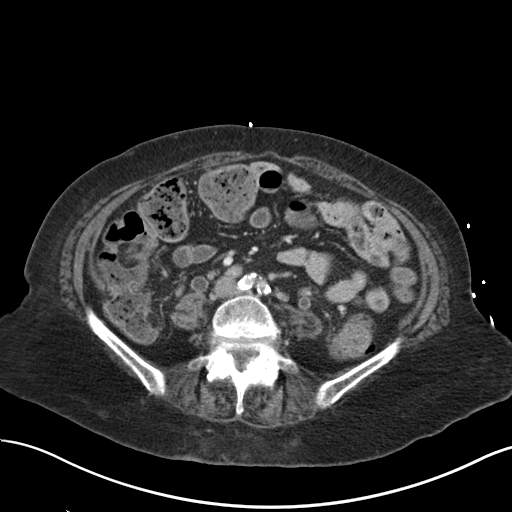
[im 47/89  soft-tissue]
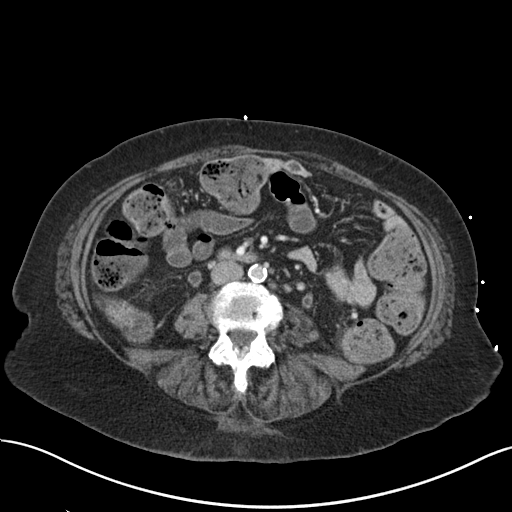
[im 53/89  soft-tissue]
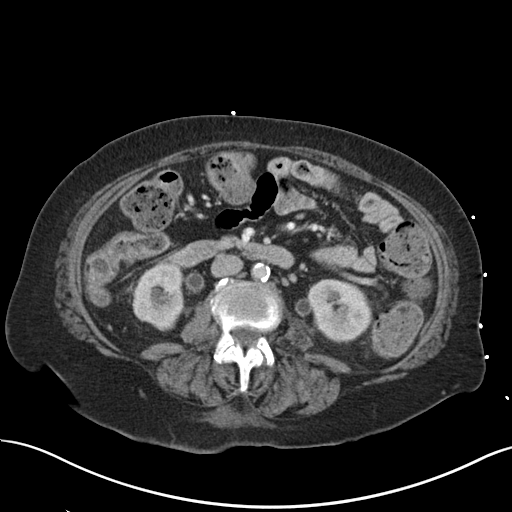
[im 59/89  soft-tissue]
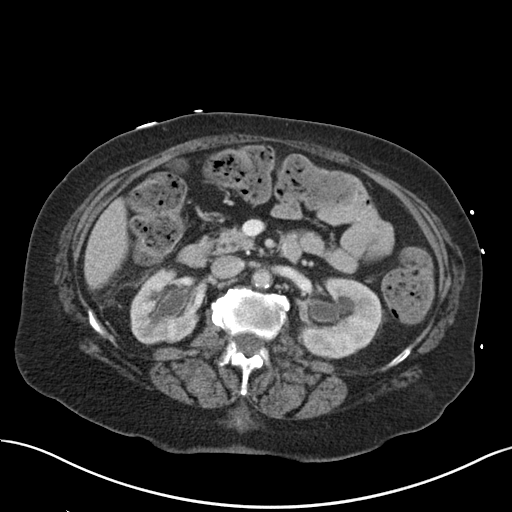
[im 59/89  bone]
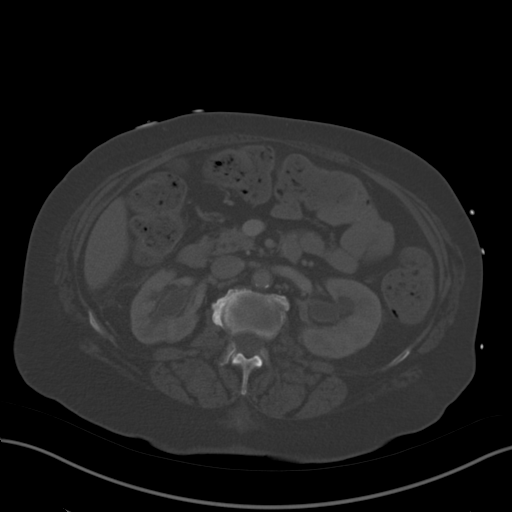
[im 71/89  soft-tissue]
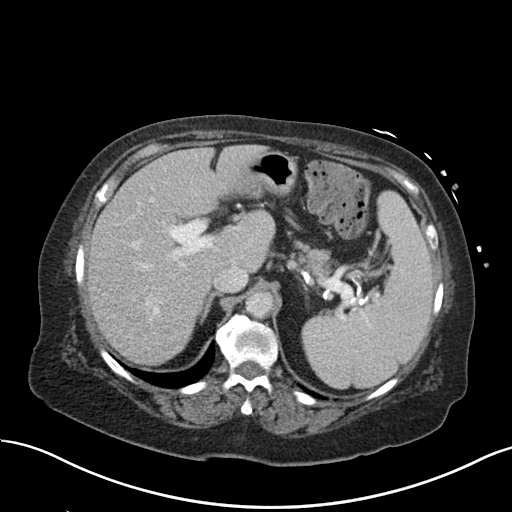
[im 77/89  soft-tissue]
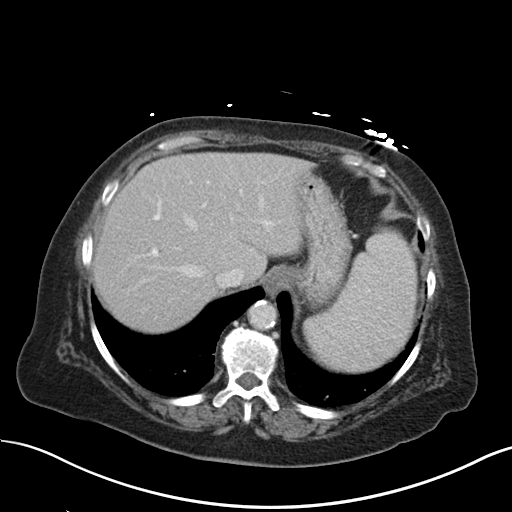
[im 83/89  soft-tissue]
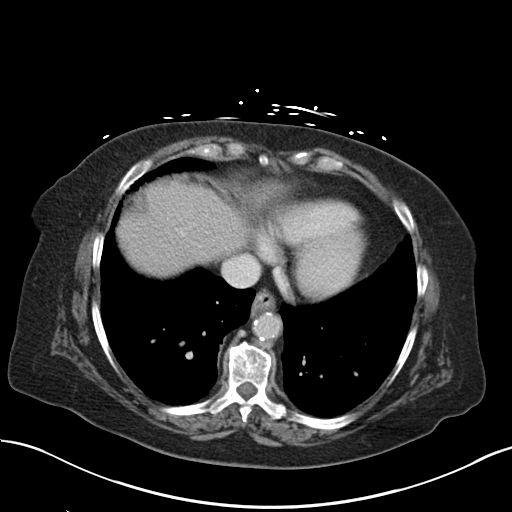

[Series 4: coronal st · coronal · 0.82mm/px · 3 of 128 slices shown]
[im 43/128  soft-tissue]
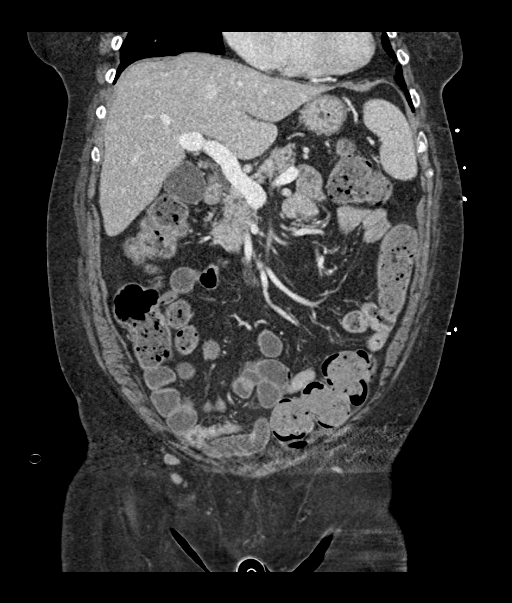
[im 57/128  soft-tissue]
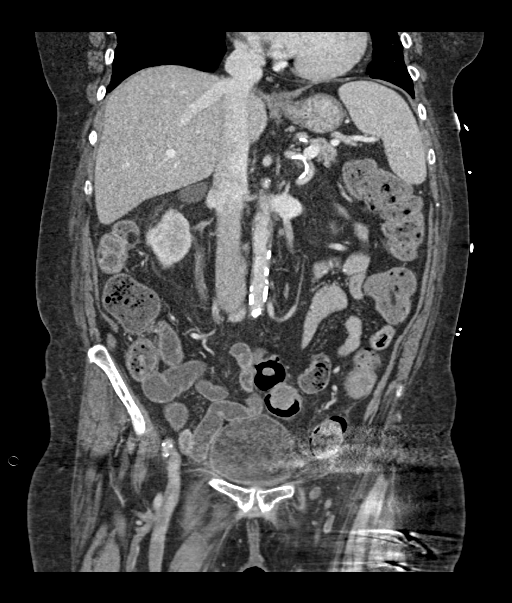
[im 71/128  soft-tissue]
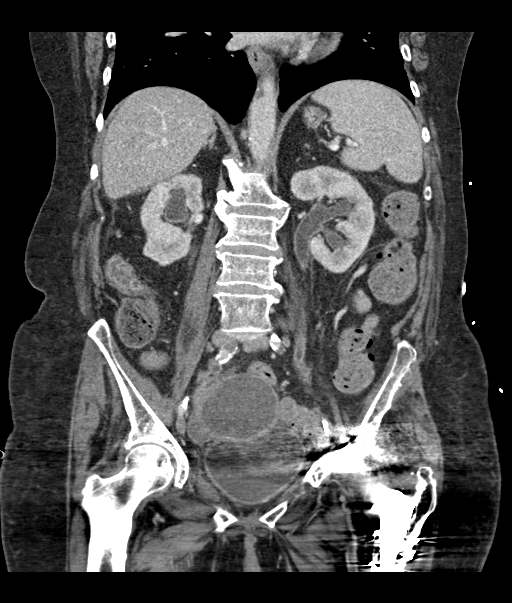

[15 of 46 positions shown; findings below may reference images not displayed]

FINDINGS: Lower chest: Dependent bibasilar atelectasis, decreased.

Hepatobiliary: Gallbladder and liver normal appearance

Pancreas: Atrophic pancreas without mass

Spleen: Normal appearance.  Tiny splenule posterior to spleen.

Adrenals/Urinary Tract: Adrenal glands normal appearance. BILATERAL
renal cortical thinning without mass. BILATERAL hydronephrosis and
hydroureter with wall thickening and enhancement of the ureters and
renal pelves, raising question of urinary tract infection; recommend
correlation with urinalysis. No urinary tract calcifications.
Bladder grossly unremarkable, portions suboptimally visualized due
to beam hardening artifacts in pelvis.

Stomach/Bowel: Appendix surgically absent by history. Stomach
decompressed. Mildly prominent stool throughout colon. Large and
small bowel loops otherwise unremarkable.

Vascular/Lymphatic: Atherosclerotic calcifications aorta, iliac
arteries, visceral arteries. Aorta normal caliber. No adenopathy.

Reproductive: Large fluid attenuation structure with a well-defined
rim centrally in the pelvis adjacent to the cranial aspect of the
urinary bladder, appears to represent endometrial cavity of uterus
distended by fluid consistent with hydro me truss. Unremarkable
adnexa.

Other: No free air or free fluid. No hernia or inflammatory process.

Musculoskeletal: Osseous demineralization. LEFT hip prosthesis.
Scattered degenerative disc and facet disease changes of the spine.
IMPRESSION: BILATERAL hydronephrosis and hydroureter with wall thickening and
enhancement of the ureters and renal pelves, raising question of
urinary tract infection, with no obstructing ureteral calcifications
seen; recommend correlation with urinalysis.

Large fluid attenuation structure with a well-defined rim in the
pelvis adjacent to the cranial aspect of the urinary bladder,
appears to represent endometrial cavity of uterus distended by fluid
consistent with hydrometros; recommend assessment by pelvic and
transvaginal ultrasound.

Post appendectomy.

Prominent stool in throughout colon.

Aortic Atherosclerosis ([id number]).

## 2022-11-23 IMAGING — US US RENAL
1 series · 14 of 22 positions shown · non-contrast
Comparison: January 18, 2021

CLINICAL DATA: Hydronephrosis

EXAM:
RENAL / URINARY TRACT ULTRASOUND COMPLETE

[Series 1: us renal · 14 of 22 slices shown]
[im 1/22]
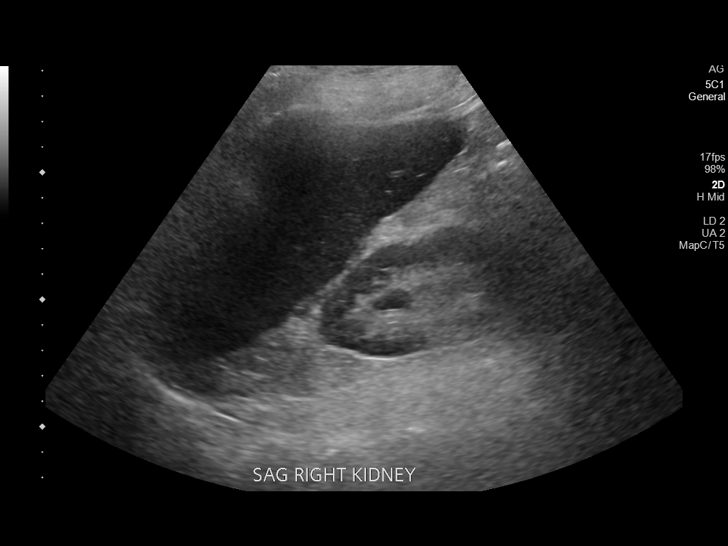
[im 3/22]
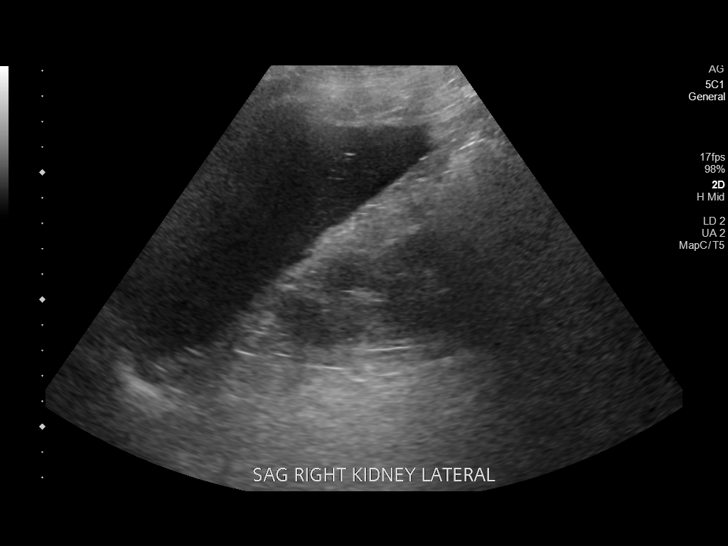
[im 4/22]
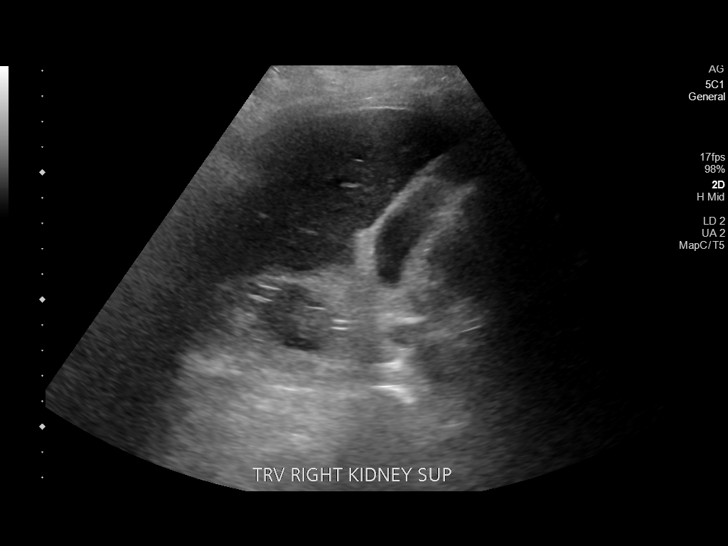
[im 6/22]
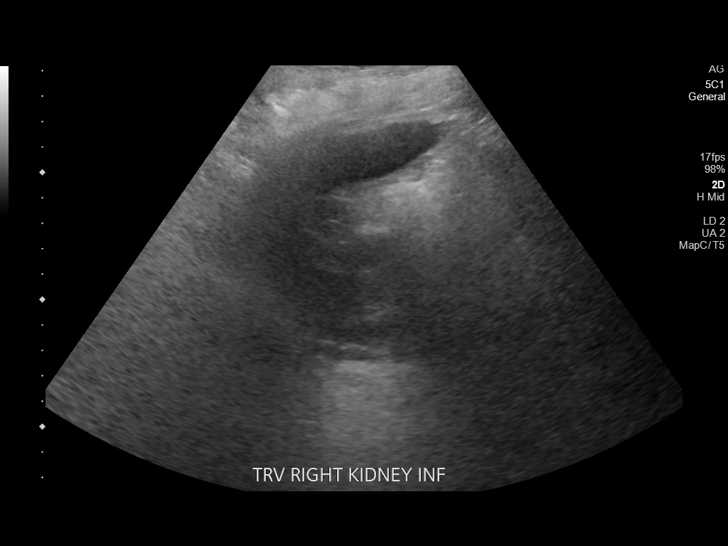
[im 8/22]
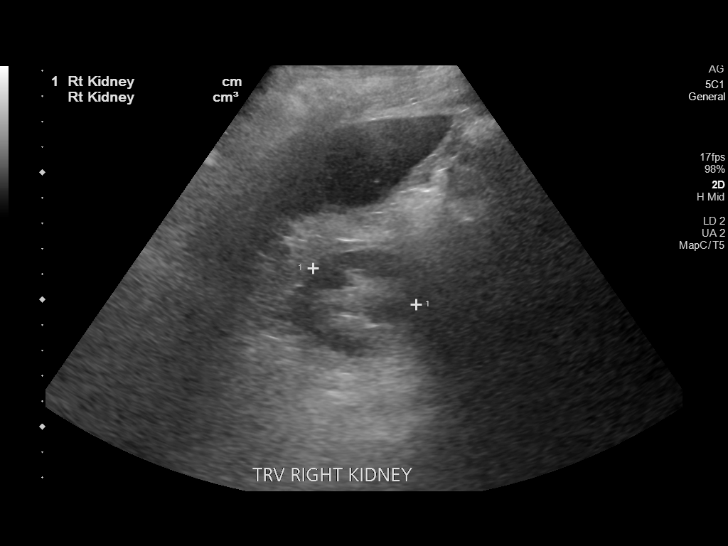
[im 9/22]
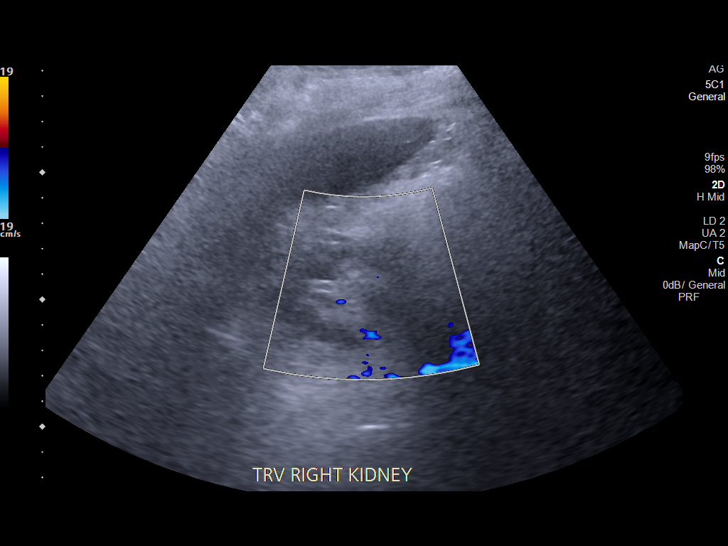
[im 11/22]
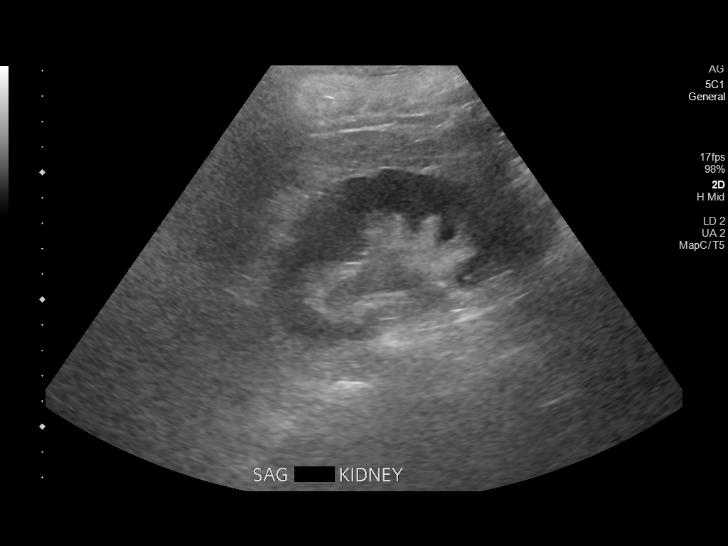
[im 12/22]
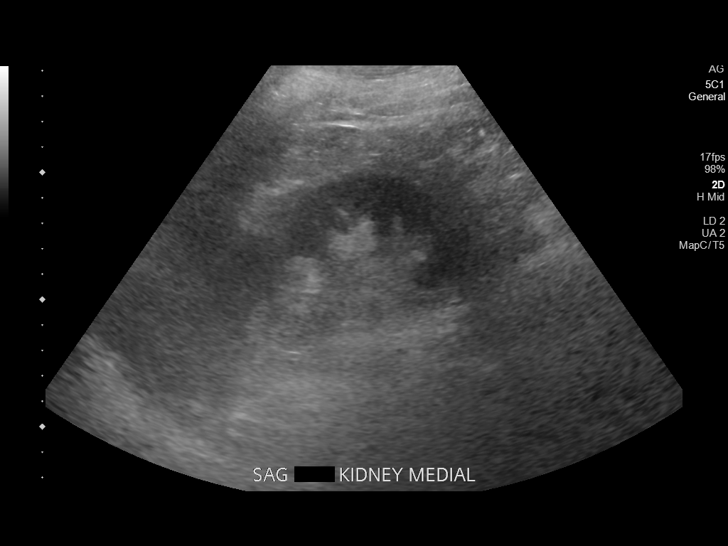
[im 14/22]
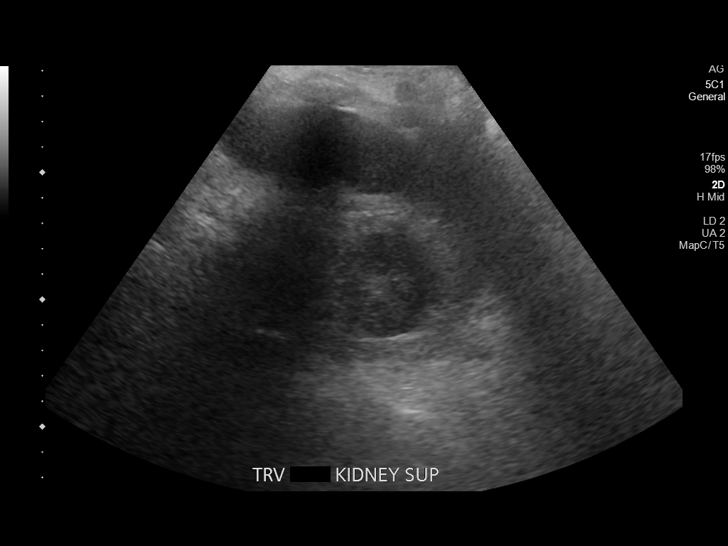
[im 15/22]
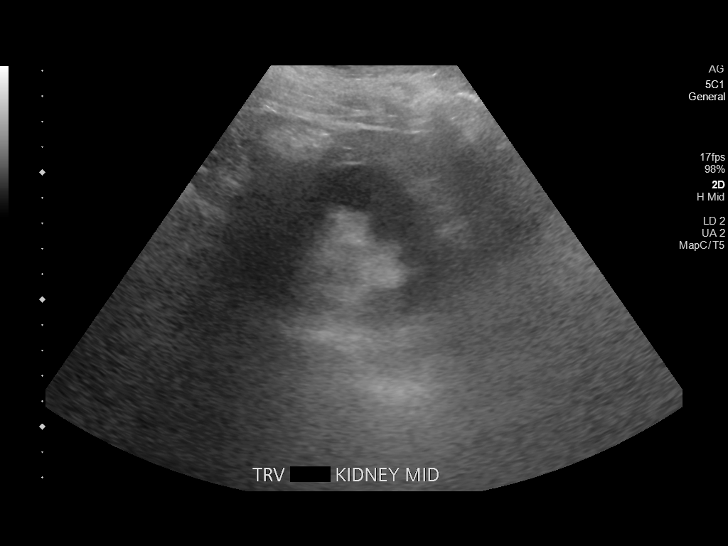
[im 17/22]
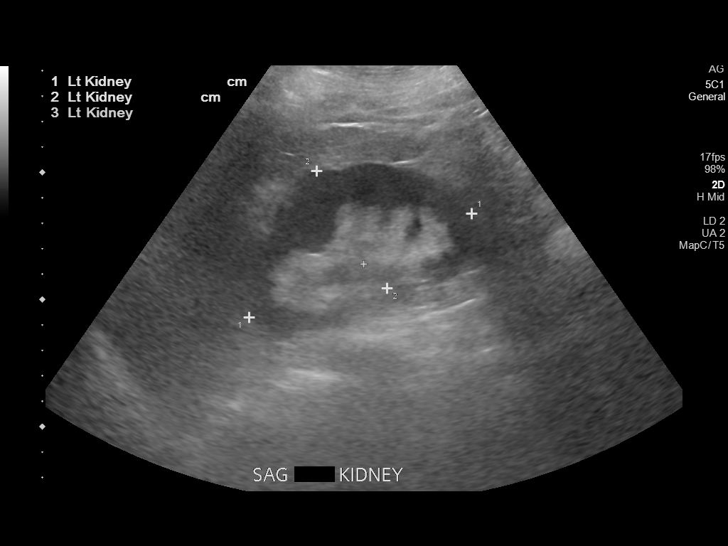
[im 19/22]
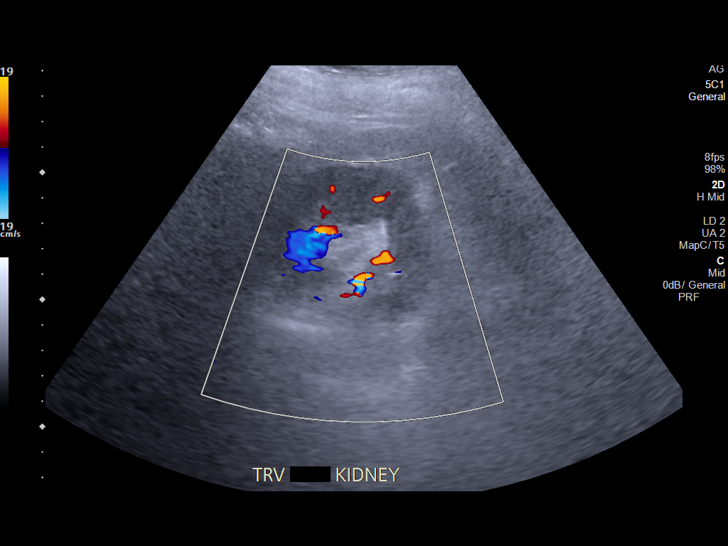
[im 20/22]
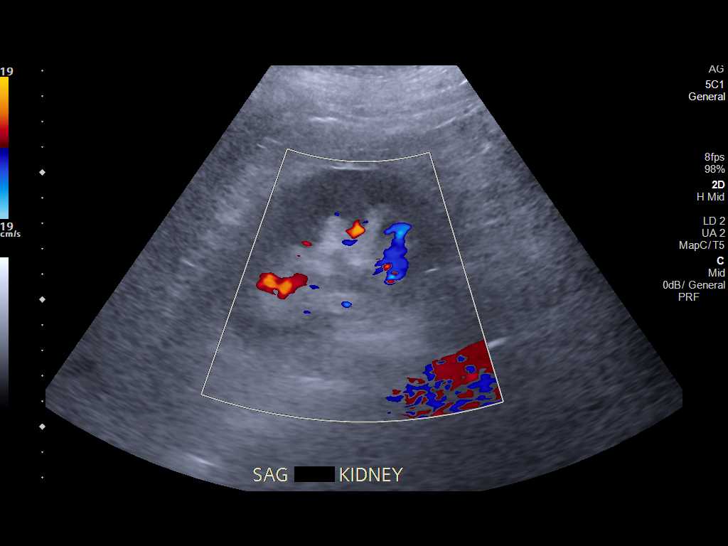
[im 22/22]
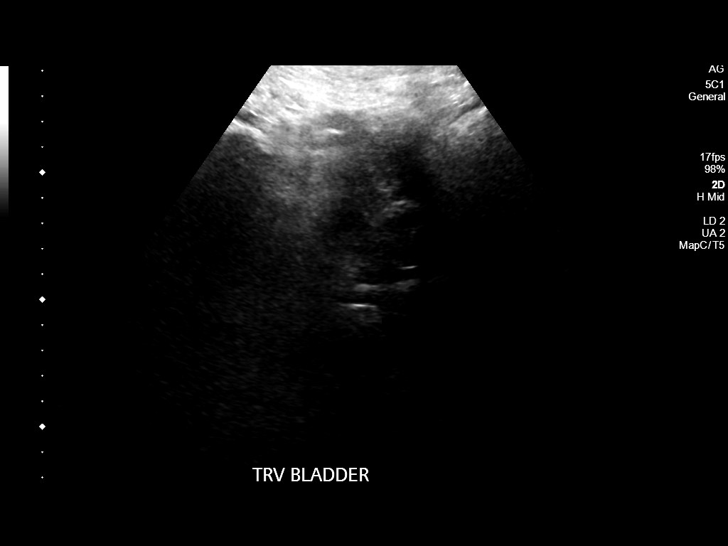

[14 of 22 positions shown; findings below may reference images not displayed]

FINDINGS: Right Kidney:

Renal measurements: 8.1 x 3.7 x 4.3 cm = volume: 68 mL. Mild diffuse
cortical thinning. Echogenicity is within normal limits. There is
mild hydronephrosis.

Left Kidney:

Renal measurements: 9.7 x 5.4 x 5.8 cm = volume: 158 mL.
Echogenicity is within normal limits. There is mild hydronephrosis.

Bladder:

Completely decompressed around a Foley catheter.

Other:

None.
IMPRESSION: There is mild bilateral hydronephrosis.

## 2022-11-24 DIAGNOSIS — H938X1 Other specified disorders of right ear: Secondary | ICD-10-CM | POA: Insufficient documentation

## 2022-12-15 ENCOUNTER — Other Ambulatory Visit: Payer: Self-pay | Admitting: Gastroenterology

## 2022-12-22 ENCOUNTER — Inpatient Hospital Stay: Payer: Medicare HMO

## 2022-12-22 ENCOUNTER — Ambulatory Visit (INDEPENDENT_AMBULATORY_CARE_PROVIDER_SITE_OTHER): Payer: Medicare HMO

## 2022-12-22 ENCOUNTER — Inpatient Hospital Stay: Payer: Medicare HMO | Admitting: Nurse Practitioner

## 2022-12-22 ENCOUNTER — Inpatient Hospital Stay: Payer: Medicare HMO | Attending: Nurse Practitioner

## 2022-12-22 ENCOUNTER — Encounter: Payer: Self-pay | Admitting: Nurse Practitioner

## 2022-12-22 VITALS — BP 158/59 | HR 60 | Temp 98.1°F | Resp 18 | Ht 60.0 in | Wt 146.0 lb

## 2022-12-22 DIAGNOSIS — C20 Malignant neoplasm of rectum: Secondary | ICD-10-CM

## 2022-12-22 LAB — CBC WITH DIFFERENTIAL (CANCER CENTER ONLY)
Abs Immature Granulocytes: 0.01 10*3/uL (ref 0.00–0.07)
Basophils Absolute: 0 10*3/uL (ref 0.0–0.1)
Basophils Relative: 1 %
Eosinophils Absolute: 0.1 10*3/uL (ref 0.0–0.5)
Eosinophils Relative: 3 %
HCT: 33.3 % — ABNORMAL LOW (ref 36.0–46.0)
Hemoglobin: 10.2 g/dL — ABNORMAL LOW (ref 12.0–15.0)
Immature Granulocytes: 0 %
Lymphocytes Relative: 26 %
Lymphs Abs: 1 10*3/uL (ref 0.7–4.0)
MCH: 27.6 pg (ref 26.0–34.0)
MCHC: 30.6 g/dL (ref 30.0–36.0)
MCV: 90.2 fL (ref 80.0–100.0)
Monocytes Absolute: 0.4 10*3/uL (ref 0.1–1.0)
Monocytes Relative: 10 %
Neutro Abs: 2.4 10*3/uL (ref 1.7–7.7)
Neutrophils Relative %: 60 %
Platelet Count: 150 10*3/uL (ref 150–400)
RBC: 3.69 MIL/uL — ABNORMAL LOW (ref 3.87–5.11)
RDW: 14.6 % (ref 11.5–15.5)
WBC Count: 4 10*3/uL (ref 4.0–10.5)
nRBC: 0 % (ref 0.0–0.2)

## 2022-12-22 LAB — CMP (CANCER CENTER ONLY)
ALT: 5 U/L (ref 0–44)
AST: 13 U/L — ABNORMAL LOW (ref 15–41)
Albumin: 4 g/dL (ref 3.5–5.0)
Alkaline Phosphatase: 114 U/L (ref 38–126)
Anion gap: 8 (ref 5–15)
BUN: 26 mg/dL — ABNORMAL HIGH (ref 8–23)
CO2: 28 mmol/L (ref 22–32)
Calcium: 9.3 mg/dL (ref 8.9–10.3)
Chloride: 104 mmol/L (ref 98–111)
Creatinine: 0.97 mg/dL (ref 0.44–1.00)
GFR, Estimated: >60 mL/min (ref >60)
Glucose, Bld: 177 mg/dL — ABNORMAL HIGH (ref 70–99)
Potassium: 4.8 mmol/L (ref 3.5–5.1)
Sodium: 140 mmol/L (ref 135–145)
Total Bilirubin: 0.4 mg/dL (ref 0.3–1.2)
Total Protein: 6.8 g/dL (ref 6.5–8.1)

## 2022-12-22 LAB — CEA (ACCESS): CEA (CHCC): 41.6 ng/mL — ABNORMAL HIGH (ref 0.00–5.00)

## 2022-12-22 LAB — MAGNESIUM: Magnesium: 1.5 mg/dL — ABNORMAL LOW (ref 1.7–2.4)

## 2022-12-22 MED ORDER — HEPARIN SOD (PORK) LOCK FLUSH 100 UNIT/ML IV SOLN
500.0000 [IU] | Freq: Once | INTRAVENOUS | Status: AC
  Administered 2022-12-22: 500 [IU] via INTRAVENOUS

## 2022-12-22 MED ORDER — SODIUM CHLORIDE 0.9% FLUSH
10.0000 mL | Freq: Once | INTRAVENOUS | Status: AC
  Administered 2022-12-22: 10 mL via INTRAVENOUS

## 2022-12-22 NOTE — Progress Notes (Signed)
[place] OFFICE PROGRESS NOTE   Diagnosis: Rectal cancer  INTERVAL HISTORY:   [name] returns as scheduled.  She has periodic mild rectal bleeding.  No significant rectal pain.  Colostomy functioning normally.  She reports a good appetite.  No falls.  Intermittent hand and foot cramps.  Her daughter notes the cramps improved for a time after receiving IV magnesium.  Objective:  Vital signs in last 24 hours:  Blood pressure (!) 158/59, pulse 60, temperature 98.1 F (36.7 C), temperature source Oral, resp. rate 18, height 5' (1.524 m), weight 146 lb (66.2 kg), SpO2 100 %.    HEENT: No thrush or ulcers. Lymphatics: No palpable cervical or supraclavicular lymph nodes. Resp: Lungs clear bilaterally. Cardio: Regular rate and rhythm. GI: No hepatomegaly.  Left lower quadrant colostomy. Vascular: Edema throughout the right leg.  Trace edema left lower leg.  Chronic stasis change bilaterally.  Erythema right lower leg. Neuro: Alert and oriented.   Lab Results:  Lab Results  Component Value Date   WBC 4.0 12/22/2022   HGB 10.2 (L) 12/22/2022   HCT 33.3 (L) 12/22/2022   MCV 90.2 12/22/2022   PLT 150 12/22/2022   NEUTROABS 2.4 12/22/2022    Imaging:  No results found.  Medications: I have reviewed the patient's current medications.  Assessment/Plan: Rectal cancer Partially obstructing mass beginning at 10 cm from the anal verge on colonoscopy 01/23/2021, biopsy-adenocarcinoma, immunohistochemical stains at [facility]-CK20, CDX-2, and SATB2 positive, patchy strong staining for p16.  The rectal tumor has a distinct immunohistochemical profile suggesting a primary colorectal tumor as opposed to metastatic cervical cancer. CT abdomen/pelvis 01/18/2021-bilateral hydronephrosis, endometrial fluid collection, prominent stool in the colon MRI pelvis 01/23/2021-tumor at 7.6 cm from the anal verge, abnormal signal bridge in the cervix, mesorectum, and anterior aspect of the  rectum-similar to MRI from 2020, MRI stage could not be defined secondary to posttreatment changes in the pelvis 03/18/2021-total abdominal hysterectomy, cystoscopy with insertion of ureteral stents, exploratory laparotomy, aborted low anterior resection, descending loop colostomy--right and left fallopian tubes and ovaries negative for malignancy; excision portion of cervix positive for adenocarcinoma; uterus with invasive moderately differentiated adenocarcinoma of the cervix, HPV associated, invading through full-thickness of the cervix/lower uterine segment and into parametrial tissue, margins of resection received disrupted, cannot be evaluated; remaining uterus with extensive endocervical adenocarcinoma in situ involving lower uterine segment and replacing endometrium.  This carcinoma was felt to be a separate primary from the rectal tumor. PET scan at [facility] 04/11/2021-intense FDG activity in region of known rectal mass.  Hypermetabolic left external iliac and right inguinal lymph nodes.  Minimal FDG uptake and small right supraclavicular lymph node.  No uptake seen in the cervix. 04/23/2021 FNA right inguinal lymph node-negative for malignancy Evaluated by Dr. Leamon Arnt 05/21/2021, 05/28/2021-appears to have 2 synchronous malignancies (cervical adenocarcinoma and rectal adenocarcinoma); further surgery which would entail pelvic exenteration not recommended by gynecologic oncology or colorectal surgery.  Full doses of radiotherapy not felt to be feasible.  Recommended 3 months of CAPOX and then repeat MRI of abdomen/pelvis, chest CT and colonoscopy. Cycle 1 Xeloda 06/13/2021 Cycle 1 FOLFOX 07/08/2021, oxaliplatin dose reduced secondary to pre-existing neuropathy Cycle 2 FOLFOX 07/29/2021, Udenyca Cycle 3 FOLFOX 08/12/2021, Udenyca Cycle 4 FOLFOX 09/02/2021, Udenyca Cycle 5 FOLFOX 09/17/2021, Udenyca CTs at [facility] 10/01/2021-unchanged findings of rectal malignancy status posttreatment.  Marked thickening of the proximal  duodenal wall.  Unchanged bilateral inguinal lymph nodes which are not enlarged but avid on prior PET.  Unchanged  diffuse bladder wall thickening and perivesicular stranding which may represent radiation cystitis.  New mild right lower lobe bronchial wall thickening, mucus impaction and tree-in-bud pulmonary nodules. Cycle 6 FOLFOX 10/22/2021, Udenyca CT 01/21/2022-stable soft tissue thickening at the lower rectum, slight increase in adjacent stranding no evidence of metastatic disease in the chest, abdomen, or pelvis, interval diverting loop colostomy     Bilateral hydronephrosis-secondary to urinary retention? Hematometra found on pelvic MRI 01/23/2021, evaluated by gynecologic oncology Cervical cancer November 1994, stage Ib, treated with external beam radiation and brachytherapy at [facility] Diabetes Neuropathy Chronic pain secondary to #6 Recurrent urinary tract infections History of a CVA History of hyperthyroidism G3, P3 COVID-19 infection January 2021 Admission 08/25/2021 with a urinary tract infection-culture negative Anemia secondary to chronic disease, chemotherapy, and rectal bleeding-1 unit packed red blood cells 08/26/2021 Hospital admission 09/23/2021-symptomatic anemia 16.  Admission 10/08/2021 with altered mental status, nausea/vomiting, and left foot ulcers Urine culture 10/08/2021 positive for Pseudomonas aeruginosa EGD 10/12/2021-gastric and duodenal ulcers-not bleeding 17.  Admission 11/29/2021 through 12/02/2021 with COVID-pneumonia    Disposition: [name] appears stable.  There is no clinical evidence for progression of the rectal cancer.  She has decided against further chemotherapy.  She has significant edema throughout the right leg.  We are referring her for a venous Doppler study.  The hand and foot cramps may be related to the low magnesium.  She will return later this week for IV magnesium.  We will continue to monitor.  She will return for lab and follow-up in 6  weeks.  We are available to see her sooner if needed.  Patient seen with Dr. Benay Spice.  Ned Card ANP/GNP-BC   12/22/2022  11:15 AM  This was a shared visit with Ned Card.  [name] was interviewed and examined.  She has significant edema throughout the right leg.  We will refer her for a Doppler to rule out a deep vein thrombosis.  The edema may be related to lymphedema secondary to obstructing tumor in the pelvis.  Julieanne Manson, MD

## 2022-12-23 ENCOUNTER — Other Ambulatory Visit: Payer: Self-pay | Admitting: *Deleted

## 2022-12-23 ENCOUNTER — Other Ambulatory Visit: Payer: Self-pay

## 2022-12-25 ENCOUNTER — Inpatient Hospital Stay: Payer: Medicare HMO

## 2022-12-25 VITALS — BP 179/52 | HR 63 | Temp 98.1°F | Resp 18

## 2022-12-25 DIAGNOSIS — C20 Malignant neoplasm of rectum: Secondary | ICD-10-CM

## 2022-12-25 MED ORDER — SODIUM CHLORIDE 0.9 % IV SOLN: Freq: Once | INTRAVENOUS | Status: AC

## 2022-12-25 MED ORDER — MAGNESIUM SULFATE 4 GM/100ML IV SOLN
4.0000 g | Freq: Once | INTRAVENOUS | Status: AC
  Administered 2022-12-25: 4 g via INTRAVENOUS
  Filled 2022-12-25: qty 100

## 2022-12-25 MED ORDER — HEPARIN SOD (PORK) LOCK FLUSH 100 UNIT/ML IV SOLN
500.0000 [IU] | Freq: Once | INTRAVENOUS | Status: AC
  Administered 2022-12-25: 500 [IU] via INTRAVENOUS

## 2022-12-25 MED ORDER — SODIUM CHLORIDE 0.9% FLUSH
10.0000 mL | Freq: Once | INTRAVENOUS | Status: AC
  Administered 2022-12-25: 10 mL via INTRAVENOUS

## 2022-12-25 NOTE — Patient Instructions (Signed)
Hypomagnesemia Hypomagnesemia is a condition in which the level of magnesium in the blood is too low. Magnesium is a mineral that is found in many foods. It is used in many different processes in the body. Hypomagnesemia can affect every organ in the body. In severe cases, it can cause life-threatening problems. What are the causes? This condition may be caused by: Not getting enough magnesium in your diet or not having enough healthy foods to eat (malnutrition). Problems with magnesium absorption in the intestines. Dehydration. Excessive use of alcohol. Vomiting. Severe or long-term (chronic) diarrhea. Some medicines, including medicines that make you urinate more often (diuretics). Certain diseases, such as kidney disease, diabetes, celiac disease, and overactive thyroid. What are the signs or symptoms? Symptoms of this condition include: Loss of appetite, nausea, and vomiting. Involuntary shaking or trembling of a body part (tremor). Muscle weakness or tingling in the arms and legs. Sudden tightening of muscles (muscle spasms). Confusion. Psychiatric issues, such as: Depression and irritability. Psychosis. A feeling of fluttering of the heart (palpitations). Seizures. These symptoms are more severe if magnesium levels drop suddenly. How is this diagnosed? This condition may be diagnosed based on: Your symptoms and medical history. A physical exam. Blood and urine tests. How is this treated? Treatment depends on the cause and the severity of the condition. It may be treated by: Taking a magnesium supplement. This can be taken in pill form. If the condition is severe, magnesium is usually given through an IV. Making changes to your diet. You may be directed to eat foods that have a lot of magnesium, such as green leafy vegetables, peas, beans, and nuts. Not drinking alcohol. If you are struggling not to drink, ask your health care provider for help. Follow these instructions at  home: Eating and drinking     Make sure that your diet includes foods with magnesium. Foods that have a lot of magnesium in them include: Green leafy vegetables, such as spinach and broccoli. Beans and peas. Nuts and seeds, such as almonds and sunflower seeds. Whole grains, such as whole grain bread and fortified cereals. Drink fluids that contain salts and minerals (electrolytes), such as sports drinks, when you are active. Do not drink alcohol. General instructions Take over-the-counter and prescription medicines only as told by your health care provider. Take magnesium supplements as directed if your health care provider tells you to take them. Have your magnesium levels monitored as told by your health care provider. Keep all follow-up visits. This is important. Contact a health care provider if: You get worse instead of better. Your symptoms return. Get help right away if: You develop severe muscle weakness. You have trouble breathing. You feel that your heart is racing. These symptoms may represent a serious problem that is an emergency. Do not wait to see if the symptoms will go away. Get medical help right away. Call your local emergency services (911 in the [place] Do not drive yourself to the hospital. Summary Hypomagnesemia is a condition in which the level of magnesium in the blood is too low. Hypomagnesemia can affect every organ in the body. Treatment may include eating more foods that contain magnesium, taking magnesium supplements, and not drinking alcohol. Have your magnesium levels monitored as told by your health care provider. This information is not intended to replace advice given to you by your health care provider. Make sure you discuss any questions you have with your health care provider. Document Revised: 04/02/2021 Document Reviewed: 04/02/2021 Elsevier Patient Education    2023 Elsevier Inc.  

## 2023-01-01 ENCOUNTER — Other Ambulatory Visit: Payer: Self-pay | Admitting: Oncology

## 2023-02-02 ENCOUNTER — Inpatient Hospital Stay: Payer: Medicare HMO | Admitting: Oncology

## 2023-02-02 ENCOUNTER — Inpatient Hospital Stay: Payer: Medicare HMO | Attending: Nurse Practitioner

## 2023-02-02 ENCOUNTER — Inpatient Hospital Stay: Payer: Medicare HMO

## 2023-02-02 VITALS — BP 145/58 | HR 99 | Temp 98.1°F | Resp 18 | Ht 60.0 in | Wt 157.0 lb

## 2023-02-02 DIAGNOSIS — Z95828 Presence of other vascular implants and grafts: Secondary | ICD-10-CM

## 2023-02-02 DIAGNOSIS — Z9221 Personal history of antineoplastic chemotherapy: Secondary | ICD-10-CM | POA: Insufficient documentation

## 2023-02-02 DIAGNOSIS — C20 Malignant neoplasm of rectum: Secondary | ICD-10-CM

## 2023-02-02 DIAGNOSIS — Z85048 Personal history of other malignant neoplasm of rectum, rectosigmoid junction, and anus: Secondary | ICD-10-CM | POA: Insufficient documentation

## 2023-02-02 DIAGNOSIS — Z8541 Personal history of malignant neoplasm of cervix uteri: Secondary | ICD-10-CM | POA: Diagnosis not present

## 2023-02-02 DIAGNOSIS — Z923 Personal history of irradiation: Secondary | ICD-10-CM | POA: Insufficient documentation

## 2023-02-02 LAB — CBC WITH DIFFERENTIAL (CANCER CENTER ONLY)
Abs Immature Granulocytes: 0.01 10*3/uL (ref 0.00–0.07)
Basophils Absolute: 0 10*3/uL (ref 0.0–0.1)
Basophils Relative: 0 %
Eosinophils Absolute: 0.1 10*3/uL (ref 0.0–0.5)
Eosinophils Relative: 2 %
HCT: 30 % — ABNORMAL LOW (ref 36.0–46.0)
Hemoglobin: 9.2 g/dL — ABNORMAL LOW (ref 12.0–15.0)
Immature Granulocytes: 0 %
Lymphocytes Relative: 19 %
Lymphs Abs: 0.8 10*3/uL (ref 0.7–4.0)
MCH: 27.7 pg (ref 26.0–34.0)
MCHC: 30.7 g/dL (ref 30.0–36.0)
MCV: 90.4 fL (ref 80.0–100.0)
Monocytes Absolute: 0.3 10*3/uL (ref 0.1–1.0)
Monocytes Relative: 8 %
Neutro Abs: 3 10*3/uL (ref 1.7–7.7)
Neutrophils Relative %: 71 %
Platelet Count: 131 10*3/uL — ABNORMAL LOW (ref 150–400)
RBC: 3.32 MIL/uL — ABNORMAL LOW (ref 3.87–5.11)
RDW: 15.1 % (ref 11.5–15.5)
WBC Count: 4.2 10*3/uL (ref 4.0–10.5)
nRBC: 0 % (ref 0.0–0.2)

## 2023-02-02 LAB — CMP (CANCER CENTER ONLY)
ALT: 6 U/L (ref 0–44)
AST: 15 U/L (ref 15–41)
Albumin: 4 g/dL (ref 3.5–5.0)
Alkaline Phosphatase: 126 U/L (ref 38–126)
Anion gap: 7 (ref 5–15)
BUN: 23 mg/dL (ref 8–23)
CO2: 24 mmol/L (ref 22–32)
Calcium: 8.8 mg/dL — ABNORMAL LOW (ref 8.9–10.3)
Chloride: 108 mmol/L (ref 98–111)
Creatinine: 1.24 mg/dL — ABNORMAL HIGH (ref 0.44–1.00)
GFR, Estimated: 45 mL/min — ABNORMAL LOW (ref >60)
Glucose, Bld: 163 mg/dL — ABNORMAL HIGH (ref 70–99)
Potassium: 4.7 mmol/L (ref 3.5–5.1)
Sodium: 139 mmol/L (ref 135–145)
Total Bilirubin: 0.3 mg/dL (ref 0.3–1.2)
Total Protein: 6.3 g/dL — ABNORMAL LOW (ref 6.5–8.1)

## 2023-02-02 LAB — CEA (ACCESS): CEA (CHCC): 44.01 ng/mL — ABNORMAL HIGH (ref 0.00–5.00)

## 2023-02-02 LAB — MAGNESIUM: Magnesium: 1.5 mg/dL — ABNORMAL LOW (ref 1.7–2.4)

## 2023-02-02 MED ORDER — HEPARIN SOD (PORK) LOCK FLUSH 100 UNIT/ML IV SOLN
500.0000 [IU] | Freq: Once | INTRAVENOUS | Status: AC
  Administered 2023-02-02: 500 [IU] via INTRAVENOUS

## 2023-02-02 MED ORDER — SODIUM CHLORIDE 0.9% FLUSH
10.0000 mL | Freq: Once | INTRAVENOUS | Status: AC
  Administered 2023-02-02: 10 mL via INTRAVENOUS

## 2023-02-02 NOTE — Patient Instructions (Signed)

## 2023-02-02 NOTE — Progress Notes (Signed)
[place] OFFICE PROGRESS NOTE   Diagnosis: Rectal cancer  INTERVAL HISTORY:   [name] returns as scheduled.  She is here with her daughter.  Good appetite.  No pain.  She was recently treated with antibiotics by her primary provider for lower extremity cellulitis.  She has chronic swelling in the lower legs.  No rectal bleeding.  She has mucus per rectum.  Objective:  Vital signs in last 24 hours:  Blood pressure (!) 145/58, pulse 99, temperature 98.1 F (36.7 C), temperature source Oral, resp. rate 18, height 5' (1.524 m), weight 157 lb (71.2 kg), SpO2 100 %.    Resp: Lungs clear bilaterally Cardio: Regular rate and rhythm GI: No hepatosplenomegaly, no mass, nontender, left lower quadrant colostomy Vascular: 1+ edema throughout the right greater than left leg, mild bilateral pretibial erythema    Portacath/PICC-without erythema  Lab Results:  Lab Results  Component Value Date   WBC 4.2 02/02/2023   HGB 9.2 (L) 02/02/2023   HCT 30.0 (L) 02/02/2023   MCV 90.4 02/02/2023   PLT 131 (L) 02/02/2023   NEUTROABS 3.0 02/02/2023    CMP  Lab Results  Component Value Date   NA 139 02/02/2023   K 4.7 02/02/2023   CL 108 02/02/2023   CO2 24 02/02/2023   GLUCOSE 163 (H) 02/02/2023   BUN 23 02/02/2023   CREATININE 1.24 (H) 02/02/2023   CALCIUM 8.8 (L) 02/02/2023   PROT 6.3 (L) 02/02/2023   ALBUMIN 4.0 02/02/2023   AST 15 02/02/2023   ALT 6 02/02/2023   ALKPHOS 126 02/02/2023   BILITOT 0.3 02/02/2023   GFRNONAA 45 (L) 02/02/2023   GFRAA >60 03/29/2020    Lab Results  Component Value Date   CEA1 28.60 (H) 06/12/2021   CEA 41.60 (H) 12/22/2022    Medications: I have reviewed the patient's current medications.   Assessment/Plan: Rectal cancer Partially obstructing mass beginning at 10 cm from the anal verge on colonoscopy 01/23/2021, biopsy-adenocarcinoma, immunohistochemical stains at [facility]-CK20, CDX-2, and SATB2 positive, patchy strong staining  for p16.  The rectal tumor has a distinct immunohistochemical profile suggesting a primary colorectal tumor as opposed to metastatic cervical cancer. CT abdomen/pelvis 01/18/2021-bilateral hydronephrosis, endometrial fluid collection, prominent stool in the colon MRI pelvis 01/23/2021-tumor at 7.6 cm from the anal verge, abnormal signal bridge in the cervix, mesorectum, and anterior aspect of the rectum-similar to MRI from 2020, MRI stage could not be defined secondary to posttreatment changes in the pelvis 03/18/2021-total abdominal hysterectomy, cystoscopy with insertion of ureteral stents, exploratory laparotomy, aborted low anterior resection, descending loop colostomy--right and left fallopian tubes and ovaries negative for malignancy; excision portion of cervix positive for adenocarcinoma; uterus with invasive moderately differentiated adenocarcinoma of the cervix, HPV associated, invading through full-thickness of the cervix/lower uterine segment and into parametrial tissue, margins of resection received disrupted, cannot be evaluated; remaining uterus with extensive endocervical adenocarcinoma in situ involving lower uterine segment and replacing endometrium.  This carcinoma was felt to be a separate primary from the rectal tumor. PET scan at [facility] 04/11/2021-intense FDG activity in region of known rectal mass.  Hypermetabolic left external iliac and right inguinal lymph nodes.  Minimal FDG uptake and small right supraclavicular lymph node.  No uptake seen in the cervix. 04/23/2021 FNA right inguinal lymph node-negative for malignancy Evaluated by Dr. Leamon Arnt 05/21/2021, 05/28/2021-appears to have 2 synchronous malignancies (cervical adenocarcinoma and rectal adenocarcinoma); further surgery which would entail pelvic exenteration not recommended by gynecologic oncology or colorectal surgery.  Full doses of radiotherapy not felt to be feasible.  Recommended 3 months of CAPOX and then repeat MRI of abdomen/pelvis,  chest CT and colonoscopy. Cycle 1 Xeloda 06/13/2021 Cycle 1 FOLFOX 07/08/2021, oxaliplatin dose reduced secondary to pre-existing neuropathy Cycle 2 FOLFOX 07/29/2021, Udenyca Cycle 3 FOLFOX 08/12/2021, Udenyca Cycle 4 FOLFOX 09/02/2021, Udenyca Cycle 5 FOLFOX 09/17/2021, Udenyca CTs at [facility] 10/01/2021-unchanged findings of rectal malignancy status posttreatment.  Marked thickening of the proximal duodenal wall.  Unchanged bilateral inguinal lymph nodes which are not enlarged but avid on prior PET.  Unchanged diffuse bladder wall thickening and perivesicular stranding which may represent radiation cystitis.  New mild right lower lobe bronchial wall thickening, mucus impaction and tree-in-bud pulmonary nodules. Cycle 6 FOLFOX 10/22/2021, Udenyca CT 01/21/2022-stable soft tissue thickening at the lower rectum, slight increase in adjacent stranding no evidence of metastatic disease in the chest, abdomen, or pelvis, interval diverting loop colostomy     Bilateral hydronephrosis-secondary to urinary retention? Hematometra found on pelvic MRI 01/23/2021, evaluated by gynecologic oncology Cervical cancer November 1994, stage Ib, treated with external beam radiation and brachytherapy at [facility] Diabetes Neuropathy Chronic pain secondary to #6 Recurrent urinary tract infections History of a CVA History of hyperthyroidism G3, P3 COVID-19 infection January 2021 Admission 08/25/2021 with a urinary tract infection-culture negative Anemia secondary to chronic disease, chemotherapy, and rectal bleeding-1 unit packed red blood cells 08/26/2021 Hospital admission 09/23/2021-symptomatic anemia 16.  Admission 10/08/2021 with altered mental status, nausea/vomiting, and left foot ulcers Urine culture 10/08/2021 positive for Pseudomonas aeruginosa EGD 10/12/2021-gastric and duodenal ulcers-not bleeding 17.  Admission 11/29/2021 through 12/02/2021 with COVID-pneumonia      Disposition: [name] appears stable.  There is  no clinical evidence for progression of rectal cancer.  The Port-A-Cath remains in place.  She will return for an office visit and Port-A-Cath flush in 8 weeks.  She indicates she does not wish to consider additional chemotherapy or radiation.  We do not plan on repeat imaging unless she has new symptoms to suggest disease progression. The bilateral leg edema is likely related to lymphatic/venous injury from pelvic surgery and radiation. Betsy Coder, MD  02/02/2023  1:20 PM

## 2023-02-03 ENCOUNTER — Other Ambulatory Visit: Payer: Self-pay

## 2023-02-03 DIAGNOSIS — C20 Malignant neoplasm of rectum: Secondary | ICD-10-CM

## 2023-02-09 ENCOUNTER — Ambulatory Visit: Payer: Medicare HMO | Admitting: Podiatrist

## 2023-02-09 ENCOUNTER — Encounter: Payer: Self-pay | Admitting: Podiatrist

## 2023-02-09 DIAGNOSIS — M79674 Pain in right toe(s): Secondary | ICD-10-CM

## 2023-02-09 DIAGNOSIS — L84 Corns and callosities: Secondary | ICD-10-CM

## 2023-02-09 DIAGNOSIS — E1142 Type 2 diabetes mellitus with diabetic polyneuropathy: Secondary | ICD-10-CM

## 2023-02-09 DIAGNOSIS — M79675 Pain in left toe(s): Secondary | ICD-10-CM

## 2023-02-09 DIAGNOSIS — B351 Tinea unguium: Secondary | ICD-10-CM

## 2023-02-09 NOTE — Progress Notes (Signed)
  Subjective:  Patient ID: [name], female    DOB: 1946-04-17,  MRN: [id number]  Chief Complaint  Patient presents with   Nail Problem    [facility] BS-did not check today A1C-do not know PCP-[place] PCP VST-1 week ago   New problem(s): None.   PCP is Everardo Beals, NP.  Allergies  Allergen Reactions   Codeine Nausea Only   Review of Systems: Negative except as noted in the HPI.  Objective:  [name] is a pleasant 77 y.o. female who presents today for diabetic foot and nail care.  No new complaints   Vascular:  Capillary fill time to digits <3 seconds b/l lower extremities. Palpable DP pulse(s) b/l lower extremities Palpable PT pulse(s) b/l lower extremities Pedal hair absent. Lower extremity skin temperature gradient within normal limits. No pain with calf compression b/l. Trace edema noted right lower extremity with no open wounds. No warmth, no frank cellulitis.  Dermatological:  Skin warm and supple b/l lower extremities. No open wounds b/l lower extremities. No interdigital macerations b/l lower extremities. Toenails 1-5 b/l elongated, discolored, dystrophic, thickened, crumbly with subungual debris and tenderness to dorsal palpation.   Preulcerative lesion(s)  sub IPJ R hallux.  No erythema, no edema, no drainage, no fluctuance. She has minimal hyperkeratosis sub hallux IPJ of the left foot.  Musculoskeletal:  Normal muscle strength 5/5 to all lower extremity muscle groups bilaterally. No pain crepitus or joint limitation noted with ROM b/l lower extremities.   Hammertoe(s) noted to the L 2nd toe and R 2nd toe.   Neurological:  Protective sensation diminished with 10g monofilament b/l. Vibratory sensation decreased b/l.  Assessment/Plan:   ICD-10-CM   1. Diabetic peripheral neuropathy associated with type 2 diabetes mellitus (HCC)  E11.42     2. Pain due to onychomycosis of toenails of both feet  B35.1    M79.675    M79.674     3. Pre-ulcerative  calluses  L84        -Patient was evaluated and treated. All patient's and/or POA's questions/concerns answered on today's visit. -Consent given for treatment as described below: -Examined patient. -Mycotic toenails 1-5 bilaterally were debrided in length and girth with sterile nail nippers and dremel without incident. -Preulcerative lesion pared medial IPJ of right great toe utilizing sterile scalpel blade. Total number pared=1. -Patient/POA to call should there be question/concern in the interim.     Bronson Ing, DPM

## 2023-02-10 ENCOUNTER — Encounter: Payer: Self-pay | Admitting: *Deleted

## 2023-02-10 NOTE — Progress Notes (Signed)
Faxed orders to [facility]. Copy to HIM

## 2023-04-02 ENCOUNTER — Other Ambulatory Visit: Payer: Medicare HMO

## 2023-04-02 ENCOUNTER — Ambulatory Visit: Payer: Medicare HMO | Admitting: Oncology

## 2023-04-06 ENCOUNTER — Other Ambulatory Visit: Payer: Self-pay | Admitting: Oncology

## 2023-04-10 ENCOUNTER — Inpatient Hospital Stay: Payer: Medicare HMO

## 2023-04-10 ENCOUNTER — Inpatient Hospital Stay: Payer: Medicare HMO | Attending: Nurse Practitioner

## 2023-04-10 ENCOUNTER — Other Ambulatory Visit: Payer: Self-pay | Admitting: *Deleted

## 2023-04-10 ENCOUNTER — Inpatient Hospital Stay: Payer: Medicare HMO | Admitting: Oncology

## 2023-04-10 VITALS — BP 142/58 | HR 56 | Temp 98.0°F | Resp 18 | Ht 60.0 in | Wt 154.0 lb

## 2023-04-10 DIAGNOSIS — C20 Malignant neoplasm of rectum: Secondary | ICD-10-CM

## 2023-04-10 DIAGNOSIS — Z8541 Personal history of malignant neoplasm of cervix uteri: Secondary | ICD-10-CM | POA: Insufficient documentation

## 2023-04-10 DIAGNOSIS — Z95828 Presence of other vascular implants and grafts: Secondary | ICD-10-CM

## 2023-04-10 LAB — CMP (CANCER CENTER ONLY)
ALT: 5 U/L (ref 0–44)
AST: 14 U/L — ABNORMAL LOW (ref 15–41)
Albumin: 4 g/dL (ref 3.5–5.0)
Alkaline Phosphatase: 144 U/L — ABNORMAL HIGH (ref 38–126)
Anion gap: 8 (ref 5–15)
BUN: 21 mg/dL (ref 8–23)
CO2: 24 mmol/L (ref 22–32)
Calcium: 8.7 mg/dL — ABNORMAL LOW (ref 8.9–10.3)
Chloride: 110 mmol/L (ref 98–111)
Creatinine: 1.24 mg/dL — ABNORMAL HIGH (ref 0.44–1.00)
GFR, Estimated: 45 mL/min — ABNORMAL LOW (ref >60)
Glucose, Bld: 188 mg/dL — ABNORMAL HIGH (ref 70–99)
Potassium: 4.4 mmol/L (ref 3.5–5.1)
Sodium: 142 mmol/L (ref 135–145)
Total Bilirubin: 0.3 mg/dL (ref 0.3–1.2)
Total Protein: 6.5 g/dL (ref 6.5–8.1)

## 2023-04-10 LAB — CBC WITH DIFFERENTIAL (CANCER CENTER ONLY)
Abs Immature Granulocytes: 0 10*3/uL (ref 0.00–0.07)
Basophils Absolute: 0 10*3/uL (ref 0.0–0.1)
Basophils Relative: 1 %
Eosinophils Absolute: 0.1 10*3/uL (ref 0.0–0.5)
Eosinophils Relative: 2 %
HCT: 29.9 % — ABNORMAL LOW (ref 36.0–46.0)
Hemoglobin: 9.4 g/dL — ABNORMAL LOW (ref 12.0–15.0)
Immature Granulocytes: 0 %
Lymphocytes Relative: 18 %
Lymphs Abs: 0.7 10*3/uL (ref 0.7–4.0)
MCH: 27.2 pg (ref 26.0–34.0)
MCHC: 31.4 g/dL (ref 30.0–36.0)
MCV: 86.4 fL (ref 80.0–100.0)
Monocytes Absolute: 0.3 10*3/uL (ref 0.1–1.0)
Monocytes Relative: 8 %
Neutro Abs: 2.9 10*3/uL (ref 1.7–7.7)
Neutrophils Relative %: 71 %
Platelet Count: 137 10*3/uL — ABNORMAL LOW (ref 150–400)
RBC: 3.46 MIL/uL — ABNORMAL LOW (ref 3.87–5.11)
RDW: 14.6 % (ref 11.5–15.5)
WBC Count: 4 10*3/uL (ref 4.0–10.5)
nRBC: 0 % (ref 0.0–0.2)

## 2023-04-10 LAB — MAGNESIUM: Magnesium: 1.6 mg/dL — ABNORMAL LOW (ref 1.7–2.4)

## 2023-04-10 MED ORDER — HEPARIN SOD (PORK) LOCK FLUSH 100 UNIT/ML IV SOLN
500.0000 [IU] | Freq: Once | INTRAVENOUS | Status: AC
  Administered 2023-04-10: 500 [IU] via INTRAVENOUS

## 2023-04-10 MED ORDER — SODIUM CHLORIDE 0.9% FLUSH
10.0000 mL | Freq: Once | INTRAVENOUS | Status: AC
  Administered 2023-04-10: 10 mL via INTRAVENOUS

## 2023-04-10 NOTE — Progress Notes (Signed)
[facility] OFFICE PROGRESS NOTE   Diagnosis: Rectal cancer  INTERVAL HISTORY:   [name] returns as scheduled.  She is here today with her daughter.  She complains of discomfort at the base of the right thumb and wrist.  She has difficulty lifting due to the discomfort.  The colostomy is functioning well.  She continues to have a parastomal hernia.  No rectal bleeding.  She is being treated for cellulitis of the right lower leg.  She started antibiotics this week.  Objective:  Vital signs in last 24 hours:  Blood pressure (!) 142/58, pulse (!) 56, temperature 98 F (36.7 C), temperature source Oral, resp. rate 18, height 5' (1.524 m), weight 154 lb (69.9 kg), SpO2 96 %.    Lymphatics: No cervical, supraclavicular, axillary, or inguinal nodes Resp: Distant breath sounds with bilateral inspiratory/expiratory wheeze, no respiratory distress Cardio: Regular rate and rhythm GI: No hepatosplenomegaly, no mass, nontender, left lower quadrant colostomy with brown stool, parastomal hernia Vascular: Mild edema in the right greater than left lower leg with chronic stasis change bilaterally, several superficial ulcerations at the right lower leg with overlying erythema Musculoskeletal: No edema at the right wrist or base of the thumb  Portacath/PICC-without erythema  Lab Results:  Lab Results  Component Value Date   WBC 4.0 04/10/2023   HGB 9.4 (L) 04/10/2023   HCT 29.9 (L) 04/10/2023   MCV 86.4 04/10/2023   PLT 137 (L) 04/10/2023   NEUTROABS 2.9 04/10/2023    CMP  Lab Results  Component Value Date   NA 142 04/10/2023   K 4.4 04/10/2023   CL 110 04/10/2023   CO2 24 04/10/2023   GLUCOSE 188 (H) 04/10/2023   BUN 21 04/10/2023   CREATININE 1.24 (H) 04/10/2023   CALCIUM 8.7 (L) 04/10/2023   PROT 6.5 04/10/2023   ALBUMIN 4.0 04/10/2023   AST 14 (L) 04/10/2023   ALT 5 04/10/2023   ALKPHOS 144 (H) 04/10/2023   BILITOT 0.3 04/10/2023   GFRNONAA 45 (L) 04/10/2023    GFRAA >60 03/29/2020    Lab Results  Component Value Date   CEA1 28.60 (H) 06/12/2021   CEA 44.01 (H) 02/02/2023    Medications: I have reviewed the patient's current medications.   Assessment/Plan: Rectal cancer Partially obstructing mass beginning at 10 cm from the anal verge on colonoscopy 01/23/2021, biopsy-adenocarcinoma, immunohistochemical stains at [facility]-CK20, CDX-2, and SATB2 positive, patchy strong staining for p16.  The rectal tumor has a distinct immunohistochemical profile suggesting a primary colorectal tumor as opposed to metastatic cervical cancer. CT abdomen/pelvis 01/18/2021-bilateral hydronephrosis, endometrial fluid collection, prominent stool in the colon MRI pelvis 01/23/2021-tumor at 7.6 cm from the anal verge, abnormal signal bridge in the cervix, mesorectum, and anterior aspect of the rectum-similar to MRI from 2020, MRI stage could not be defined secondary to posttreatment changes in the pelvis 03/18/2021-total abdominal hysterectomy, cystoscopy with insertion of ureteral stents, exploratory laparotomy, aborted low anterior resection, descending loop colostomy--right and left fallopian tubes and ovaries negative for malignancy; excision portion of cervix positive for adenocarcinoma; uterus with invasive moderately differentiated adenocarcinoma of the cervix, HPV associated, invading through full-thickness of the cervix/lower uterine segment and into parametrial tissue, margins of resection received disrupted, cannot be evaluated; remaining uterus with extensive endocervical adenocarcinoma in situ involving lower uterine segment and replacing endometrium.  This carcinoma was felt to be a separate primary from the rectal tumor. PET scan at [facility] 04/11/2021-intense FDG activity in region of known rectal mass.  Hypermetabolic  left external iliac and right inguinal lymph nodes.  Minimal FDG uptake and small right supraclavicular lymph node.  No uptake seen in the cervix. 04/23/2021 FNA  right inguinal lymph node-negative for malignancy Evaluated by Dr. Lattie Corns 05/21/2021, 05/28/2021-appears to have 2 synchronous malignancies (cervical adenocarcinoma and rectal adenocarcinoma); further surgery which would entail pelvic exenteration not recommended by gynecologic oncology or colorectal surgery.  Full doses of radiotherapy not felt to be feasible.  Recommended 3 months of CAPOX and then repeat MRI of abdomen/pelvis, chest CT and colonoscopy. Cycle 1 Xeloda 06/13/2021 Cycle 1 FOLFOX 07/08/2021, oxaliplatin dose reduced secondary to pre-existing neuropathy Cycle 2 FOLFOX 07/29/2021, Udenyca Cycle 3 FOLFOX 08/12/2021, Udenyca Cycle 4 FOLFOX 09/02/2021, Udenyca Cycle 5 FOLFOX 09/17/2021, Udenyca CTs at [facility] 10/01/2021-unchanged findings of rectal malignancy status posttreatment.  Marked thickening of the proximal duodenal wall.  Unchanged bilateral inguinal lymph nodes which are not enlarged but avid on prior PET.  Unchanged diffuse bladder wall thickening and perivesicular stranding which may represent radiation cystitis.  New mild right lower lobe bronchial wall thickening, mucus impaction and tree-in-bud pulmonary nodules. Cycle 6 FOLFOX 10/22/2021, Udenyca CT 01/21/2022-stable soft tissue thickening at the lower rectum, slight increase in adjacent stranding no evidence of metastatic disease in the chest, abdomen, or pelvis, interval diverting loop colostomy     Bilateral hydronephrosis-secondary to urinary retention? Hematometra found on pelvic MRI 01/23/2021, evaluated by gynecologic oncology Cervical cancer November 1994, stage Ib, treated with external beam radiation and brachytherapy at [facility] Diabetes Neuropathy Chronic pain secondary to #6 Recurrent urinary tract infections History of a CVA History of hyperthyroidism G3, P3 COVID-19 infection January 2021 Admission 08/25/2021 with a urinary tract infection-culture negative Anemia secondary to chronic disease, chemotherapy, and rectal  bleeding-1 unit packed red blood cells 08/26/2021 Hospital admission 09/23/2021-symptomatic anemia 16.  Admission 10/08/2021 with altered mental status, nausea/vomiting, and left foot ulcers Urine culture 10/08/2021 positive for Pseudomonas aeruginosa EGD 10/12/2021-gastric and duodenal ulcers-not bleeding 17.  Admission 11/29/2021 through 12/02/2021 with COVID-pneumonia     Disposition: Ms. Schlough appears stable.  She reconfirmed her decision against further treatment of the rectal cancer.  A Port-A-Cath remains in place.  She will return for a Port-A-Cath flush and office visit in 8 weeks.  We referred her to the ostomy clinic.  I recommended she follow-up with her primary provider for evaluation of the right hand/wrist discomfort.  Thornton Papas, MD  04/10/2023  11:53 AM

## 2023-04-10 NOTE — Progress Notes (Signed)
Placed referral for ostomy clinic at patient/daughter's request.

## 2023-04-17 ENCOUNTER — Other Ambulatory Visit: Payer: Self-pay | Admitting: Gastroenterology

## 2023-04-21 ENCOUNTER — Ambulatory Visit (HOSPITAL_COMMUNITY)
Admission: RE | Admit: 2023-04-21 | Discharge: 2023-04-21 | Disposition: A | Discharge status: Home / Self Care | Payer: Medicare HMO | Source: Ambulatory Visit | Attending: Nurse Practitioner | Admitting: Nurse Practitioner

## 2023-04-21 ENCOUNTER — Other Ambulatory Visit: Payer: Self-pay | Admitting: Gastroenterology

## 2023-04-21 DIAGNOSIS — C20 Malignant neoplasm of rectum: Secondary | ICD-10-CM | POA: Diagnosis not present

## 2023-04-21 DIAGNOSIS — K9400 Colostomy complication, unspecified: Secondary | ICD-10-CM | POA: Diagnosis not present

## 2023-04-21 DIAGNOSIS — Y833 Surgical operation with formation of external stoma as the cause of abnormal reaction of the patient, or of later complication, without mention of misadventure at the time of the procedure: Secondary | ICD-10-CM | POA: Insufficient documentation

## 2023-04-21 DIAGNOSIS — K435 Parastomal hernia without obstruction or  gangrene: Secondary | ICD-10-CM | POA: Diagnosis not present

## 2023-04-21 DIAGNOSIS — K9409 Other complications of colostomy: Secondary | ICD-10-CM | POA: Insufficient documentation

## 2023-04-21 DIAGNOSIS — Z933 Colostomy status: Secondary | ICD-10-CM | POA: Diagnosis present

## 2023-04-21 NOTE — Progress Notes (Signed)
[facility]   Reason for visit:  LLQ colostomy HPI:  Rectal cancer with resection and end colostomy Past Medical History:  Diagnosis Date   Arthritis    Basal ganglia stroke [facility])    March 2022   Bilateral lower extremity edema    Cancer (HCC)    twice- colon cancer, unsure of the other type of cancer   Carotid artery occlusion    Flank pain    Full dentures    History of cervical cancer    11/ 1994  Stage IB  s/p  high dose radiation brachytherapy @ [facility] 01/ 1995--- per pt no recurrence   History of chronic bronchitis    History of hyperthyroidism    d 03/ 2011---due to grave's disease--- s/p  RAI treatment 04/ 2011   History of kidney stones    History of sepsis 05/03/2018   due to UTI with klebsiella/ pyelonephritis/ ureteral obstruction cause by stone   Hyperlipidemia    Hypothyroidism, postradioiodine therapy    endocrinologist--  dr Everardo All--  dx graves disease and s/p RAI i131 treatement 4/ 2011   Nauseated    Oral thrush 05/03/2018   Pneumonia    Type 2 diabetes mellitus treated with insulin (HCC)    FOLLOWED BY PCP   Urgency of urination    Wears glasses    Family History  Problem Relation Age of Onset   Emphysema Father    Diabetes Father    Emphysema Sister    Emphysema Brother    Cancer Mother        Skin Cancer   Diabetes Mother    Colon cancer Neg Hx    Esophageal cancer Neg Hx    Rectal cancer Neg Hx    Stomach cancer Neg Hx    Allergies  Allergen Reactions   Codeine Nausea Only   Current Outpatient Medications  Medication Sig Dispense Refill Last Dose   ascorbic acid (VITAMIN C) 500 MG tablet Take 1 tablet (500 mg total) by mouth daily. 30 tablet 0    aspirin 81 MG EC tablet Take 1 tablet (81 mg total) by mouth daily. RESUME AFTER 1 WEEK 30 tablet 1    atorvastatin (LIPITOR) 40 MG tablet Take 40 mg by mouth every morning.      baclofen (LIORESAL) 10 MG tablet Take 10 mg by mouth 4 (four) times daily as needed for muscle spasms.       clopidogrel (PLAVIX) 75 MG tablet Take 1 tablet (75 mg total) by mouth at bedtime. 30 tablet 1    docusate sodium (COLACE) 100 MG capsule Take 100 mg by mouth 2 (two) times daily.      feeding supplement, GLUCERNA SHAKE, (GLUCERNA SHAKE) LIQD Take 237 mLs by mouth 3 (three) times daily between meals. (Patient not taking: Reported on 04/10/2023) 10000 mL 0    furosemide (LASIX) 20 MG tablet Take 20 mg by mouth daily.      gabapentin (NEURONTIN) 300 MG capsule Take 300-600 mg by mouth See admin instructions. Take one capsule (300 mg) by mouth every morning and two capsules (600 mg) at night      gemfibrozil (LOPID) 600 MG tablet Take 600 mg by mouth 2 (two) times daily.      insulin degludec (TRESIBA FLEXTOUCH) 100 UNIT/ML FlexTouch Pen Inject 10 Units into the skin daily. (Patient not taking: Reported on 02/02/2023)      levothyroxine (SYNTHROID) 125 MCG tablet Take 1 tablet (125 mcg total) by mouth daily  at 6 (six) AM. 30 tablet 2    MAGNESIUM-OXIDE 400 (240 Mg) MG tablet Take 1 tablet by mouth twice daily 60 tablet 0    melatonin 5 MG TABS Take 1 tablet (5 mg total) by mouth at bedtime. 30 tablet 0    metFORMIN (GLUCOPHAGE) 500 MG tablet Take 500 mg by mouth 2 (two) times daily.      Multiple Vitamin (MULTIVITAMIN WITH MINERALS) TABS tablet Take 1 tablet by mouth daily. 30 tablet 0    nystatin (MYCOSTATIN/NYSTOP) powder Apply 1 Application topically 2 (two) times daily. To groin rash 15 g 1    ondansetron (ZOFRAN) 8 MG tablet Take 1 tablet (8 mg total) by mouth every 6 (six) hours as needed for nausea or vomiting. (Patient not taking: Reported on 02/02/2023) 40 tablet 1    pantoprazole (PROTONIX) 40 MG tablet Take 1 tablet by mouth twice daily (Patient not taking: Reported on 04/10/2023) 60 tablet 3    polyethylene glycol powder (GLYCOLAX/MIRALAX) 17 GM/SCOOP powder Take 17 g by mouth daily.      prochlorperazine (COMPAZINE) 10 MG tablet Take 10 mg by mouth every 6 (six) hours as needed for nausea  or vomiting. (Patient not taking: Reported on 02/02/2023)      sertraline (ZOLOFT) 50 MG tablet Take 50 mg by mouth daily.      sulfamethoxazole-trimethoprim (BACTRIM DS) 800-160 MG tablet Take 1 tablet by mouth 2 (two) times daily.      triamcinolone cream (KENALOG) 0.1 % Apply 1 application  topically 2 (two) times daily as needed (groin rash). Mix with nystatin cream (Patient not taking: Reported on 02/02/2023)      XTAMPZA ER 9 MG C12A Take 1 capsule by mouth 2 (two) times daily. 30 capsule 0    Current Facility-Administered Medications  Medication Dose Route Frequency Provider Last Rate Last Admin   0.9 %  sodium chloride infusion  500 mL Intravenous Once Tressia Danas, MD       dextrose 5 % solution   Intravenous Continuous Tressia Danas, MD       ROS  Review of Systems  Constitutional:  Positive for fatigue.  Gastrointestinal:        LLQ colostomy  Skin:  Positive for color change and wound.       Redness to right lower leg.  Treating for cellulitis.   All other systems reviewed and are negative.  Vital signs:  BP (!) 155/47   Pulse 62   Temp 98 F (36.7 C)   Resp 18   SpO2 97%  Exam:  Physical Exam Vitals reviewed.  Constitutional:      Appearance: She is obese.  Abdominal:     Palpations: Abdomen is soft.     Hernia: A hernia is present.  Skin:    General: Skin is warm and dry.     Findings: Erythema and lesion present.  Neurological:     General: No focal deficit present.     Mental Status: She is alert.  Psychiatric:        Mood and Affect: Mood normal.        Behavior: Behavior normal.     Stoma type/location:  LLQ colostomy Stomal assessment/size:  1 3/4" slightly budded with parastomal hernia  Peristomal assessment:  bulging from 2 to 7 o'clock around stoma.  Creates a round surface Treatment options for stomal/peristomal skin: stoma powder and skin prep if skin irritated  barrier ring and 1 piece convex pouch.  Output: soft brown  stool Ostomy  pouching: 1pc. Flexible convex Education provided:  pouch change performed and added ostomy belt for added support.     Impression/dx  Hernia Colostomy complication Discussion  See back one week to check new pouching system and assist with ordering appropriate supplies.  Plan  See back in clinic 1-2 weeks as needed.     Visit time: 45 minutes.   Maple Hudson FNP-BC

## 2023-04-22 ENCOUNTER — Emergency Department (HOSPITAL_COMMUNITY): Payer: Medicare HMO

## 2023-04-22 ENCOUNTER — Emergency Department (HOSPITAL_COMMUNITY)
Admission: EM | Admit: 2023-04-22 | Discharge: 2023-04-22 | Disposition: A | Discharge status: Home / Self Care | Payer: Medicare HMO | Attending: Emergency Medicine | Admitting: Emergency Medicine

## 2023-04-22 ENCOUNTER — Encounter (HOSPITAL_COMMUNITY): Payer: Self-pay

## 2023-04-22 ENCOUNTER — Emergency Department (HOSPITAL_BASED_OUTPATIENT_CLINIC_OR_DEPARTMENT_OTHER): Payer: Medicare HMO

## 2023-04-22 ENCOUNTER — Other Ambulatory Visit: Payer: Self-pay

## 2023-04-22 DIAGNOSIS — Z933 Colostomy status: Secondary | ICD-10-CM | POA: Insufficient documentation

## 2023-04-22 DIAGNOSIS — R062 Wheezing: Secondary | ICD-10-CM | POA: Insufficient documentation

## 2023-04-22 DIAGNOSIS — M7989 Other specified soft tissue disorders: Secondary | ICD-10-CM

## 2023-04-22 DIAGNOSIS — Z7902 Long term (current) use of antithrombotics/antiplatelets: Secondary | ICD-10-CM | POA: Insufficient documentation

## 2023-04-22 DIAGNOSIS — R600 Localized edema: Secondary | ICD-10-CM | POA: Diagnosis not present

## 2023-04-22 DIAGNOSIS — Z923 Personal history of irradiation: Secondary | ICD-10-CM | POA: Diagnosis not present

## 2023-04-22 DIAGNOSIS — R590 Localized enlarged lymph nodes: Secondary | ICD-10-CM | POA: Insufficient documentation

## 2023-04-22 DIAGNOSIS — Z85048 Personal history of other malignant neoplasm of rectum, rectosigmoid junction, and anus: Secondary | ICD-10-CM | POA: Insufficient documentation

## 2023-04-22 DIAGNOSIS — Z7982 Long term (current) use of aspirin: Secondary | ICD-10-CM | POA: Insufficient documentation

## 2023-04-22 DIAGNOSIS — Z8541 Personal history of malignant neoplasm of cervix uteri: Secondary | ICD-10-CM | POA: Insufficient documentation

## 2023-04-22 DIAGNOSIS — I700 Atherosclerosis of aorta: Secondary | ICD-10-CM | POA: Insufficient documentation

## 2023-04-22 LAB — CBC
HCT: 32.8 % — ABNORMAL LOW (ref 36.0–46.0)
Hemoglobin: 9.4 g/dL — ABNORMAL LOW (ref 12.0–15.0)
MCH: 26.7 pg (ref 26.0–34.0)
MCHC: 28.7 g/dL — ABNORMAL LOW (ref 30.0–36.0)
MCV: 93.2 fL (ref 80.0–100.0)
Platelets: 117 10*3/uL — ABNORMAL LOW (ref 150–400)
RBC: 3.52 MIL/uL — ABNORMAL LOW (ref 3.87–5.11)
RDW: 15.8 % — ABNORMAL HIGH (ref 11.5–15.5)
WBC: 6 10*3/uL (ref 4.0–10.5)
nRBC: 0 % (ref 0.0–0.2)

## 2023-04-22 LAB — BASIC METABOLIC PANEL
Anion gap: 8 (ref 5–15)
BUN: 18 mg/dL (ref 8–23)
CO2: 21 mmol/L — ABNORMAL LOW (ref 22–32)
Calcium: 7.9 mg/dL — ABNORMAL LOW (ref 8.9–10.3)
Chloride: 109 mmol/L (ref 98–111)
Creatinine, Ser: 1.05 mg/dL — ABNORMAL HIGH (ref 0.44–1.00)
GFR, Estimated: 55 mL/min — ABNORMAL LOW (ref >60)
Glucose, Bld: 133 mg/dL — ABNORMAL HIGH (ref 70–99)
Potassium: 3.5 mmol/L (ref 3.5–5.1)
Sodium: 138 mmol/L (ref 135–145)

## 2023-04-22 LAB — BRAIN NATRIURETIC PEPTIDE: B Natriuretic Peptide: 103.4 pg/mL — ABNORMAL HIGH (ref 0.0–100.0)

## 2023-04-22 MED ORDER — SULFAMETHOXAZOLE-TRIMETHOPRIM 800-160 MG PO TABS: 1.0000 | ORAL_TABLET | Freq: Two times a day (BID) | ORAL | 0 refills | Status: AC

## 2023-04-22 MED ORDER — ACETAMINOPHEN 500 MG PO TABS
1000.0000 mg | ORAL_TABLET | Freq: Once | ORAL | Status: AC
  Administered 2023-04-22: 1000 mg via ORAL
  Filled 2023-04-22: qty 2

## 2023-04-22 MED ORDER — HEPARIN SOD (PORK) LOCK FLUSH 100 UNIT/ML IV SOLN
INTRAVENOUS | Status: AC
  Filled 2023-04-22: qty 5

## 2023-04-22 NOTE — Progress Notes (Signed)
Bilateral lower extremity venous study completed.   Preliminary results relayed to DO.  Please see CV Procedures for preliminary results.  Cami Delawder, RVT  7:06 PM 04/22/23

## 2023-04-22 NOTE — Discharge Instructions (Addendum)
You were evaluated for leg swelling. You did not have evidence of a clot. The CT scan showed evidence of skin infection and lymph nodes in the inguinal area on the right. It is hard to determine if the skin infection caused lymph node enlargement or if the lymph nodes are enlarged for another reason and caused leg swelling and infection. We will treat you with Bactrim 1 tablet twice daily for 7 days starting 6/6. You should follow up with your primary care doctor within 1-2 weeks to get reevaluated and to get an ultrasound of the lymph nodes. They should decrease in size if they are due to infection. If not they may be from cancer and would require further evaluation. It was a pleasure caring for you!

## 2023-04-22 NOTE — ED Triage Notes (Signed)
Bilateral leg swelling, right leg worse than left. This has been ongoing for a few months, got worse this AM. Pt had red streaking up right leg today and pt went to PCP and was told to come here for further evaluation. Denies SOB. States increased fatigue and  headache.  Daughter states intermittent disorientation and hx of same with UTIs.

## 2023-04-22 NOTE — ED Provider Notes (Signed)
[place] EMERGENCY DEPARTMENT AT [facility] Provider Note   CSN: [id number] Arrival date & time: 04/22/23  1633     History  Chief Complaint  Patient presents with   Leg Swelling    [name] is a 77 y.o. female with a pmh of cervical cancer s/p pelvic irradiation, rectal cancer s/p colostomy ,bilateral LE edema c/b cellulitis,metabolic syndrome.  Patient endorses 3-4 months of leg swelling. She says it was initially just the right but now involves the left. The left has not really gotten worse but the right has progressively gotten larger with swelling up to the thigh. She has been treated for cellulitis around 4 times in the past months, last completed a 10 day antibiotic course a few days ago with some improvement in redness. This AM reported to have streaking erythema and warmth of the RLE. As of recent has gotten oozing and open sores. Denies fever, chills, CP,SOB,orthopnea, PND,N/V, abdominal pain, changes in ostomy output or caliber.        Home Medications Prior to Admission medications   Medication Sig Start Date End Date Taking? Authorizing Provider  ascorbic acid (VITAMIN C) 500 MG tablet Take 1 tablet (500 mg total) by mouth daily. 12/03/21   Rolly Salter, MD  aspirin 81 MG EC tablet Take 1 tablet (81 mg total) by mouth daily. RESUME AFTER 1 WEEK 09/03/21   Osvaldo Shipper, MD  atorvastatin (LIPITOR) 40 MG tablet Take 40 mg by mouth every morning. 12/15/19   [provider]  baclofen (LIORESAL) 10 MG tablet Take 10 mg by mouth 4 (four) times daily as needed for muscle spasms. 03/22/21   [provider]  clopidogrel (PLAVIX) 75 MG tablet Take 1 tablet (75 mg total) by mouth at bedtime. 10/12/21   Willeen Niece, MD  docusate sodium (COLACE) 100 MG capsule Take 100 mg by mouth 2 (two) times daily.    [provider]  feeding supplement, GLUCERNA SHAKE, (GLUCERNA SHAKE) LIQD Take 237 mLs by mouth 3 (three) times daily between  meals. Patient not taking: Reported on 04/10/2023 12/02/21   Rolly Salter, MD  furosemide (LASIX) 20 MG tablet Take 20 mg by mouth daily. 11/19/21   [provider]  gabapentin (NEURONTIN) 300 MG capsule Take 300-600 mg by mouth See admin instructions. Take one capsule (300 mg) by mouth every morning and two capsules (600 mg) at night    [provider]  gemfibrozil (LOPID) 600 MG tablet Take 600 mg by mouth 2 (two) times daily.    [provider]  insulin degludec (TRESIBA FLEXTOUCH) 100 UNIT/ML FlexTouch Pen Inject 10 Units into the skin daily. Patient not taking: Reported on 02/02/2023 08/28/21   Osvaldo Shipper, MD  levothyroxine (SYNTHROID) 125 MCG tablet Take 1 tablet (125 mcg total) by mouth daily at 6 (six) AM. 08/29/21 04/10/23  Osvaldo Shipper, MD  MAGNESIUM-OXIDE 400 (240 Mg) MG tablet Take 1 tablet by mouth twice daily 04/06/23   Ladene Artist, MD  melatonin 5 MG TABS Take 1 tablet (5 mg total) by mouth at bedtime. 12/02/21   Rolly Salter, MD  metFORMIN (GLUCOPHAGE) 500 MG tablet Take 500 mg by mouth 2 (two) times daily. 02/01/21   [provider]  Multiple Vitamin (MULTIVITAMIN WITH MINERALS) TABS tablet Take 1 tablet by mouth daily. 12/03/21   Rolly Salter, MD  nystatin (MYCOSTATIN/NYSTOP) powder Apply 1 Application topically 2 (two) times daily. To groin rash 11/11/22   Ladene Artist,  MD  ondansetron (ZOFRAN) 8 MG tablet Take 1 tablet (8 mg total) by mouth every 6 (six) hours as needed for nausea or vomiting. Patient not taking: Reported on 02/02/2023 10/31/21   Esterwood, Amy S, PA-C  pantoprazole (PROTONIX) 40 MG tablet Take 1 tablet by mouth twice daily Patient not taking: Reported on 04/10/2023 12/15/22   Tressia Danas, MD  polyethylene glycol powder (GLYCOLAX/MIRALAX) 17 GM/SCOOP powder Take 17 g by mouth daily.    [provider]  prochlorperazine (COMPAZINE) 10 MG tablet Take 10 mg by mouth every 6 (six) hours as needed for  nausea or vomiting. Patient not taking: Reported on 02/02/2023    [provider]  sertraline (ZOLOFT) 50 MG tablet Take 50 mg by mouth daily.    [provider]  sulfamethoxazole-trimethoprim (BACTRIM DS) 800-160 MG tablet Take 1 tablet by mouth 2 (two) times daily. 04/06/23   [provider]  triamcinolone cream (KENALOG) 0.1 % Apply 1 application  topically 2 (two) times daily as needed (groin rash). Mix with nystatin cream Patient not taking: Reported on 02/02/2023 09/19/20   [provider]  XTAMPZA ER 9 MG C12A Take 1 capsule by mouth 2 (two) times daily. 10/31/21   Esterwood, Amy S, PA-C      Allergies    Codeine    Review of Systems   Review of Systems  All other systems reviewed and are negative.   Physical Exam Updated Vital Signs BP (!) 164/56 (BP Location: Right Arm)   Pulse 60   Temp 97.8 F (36.6 C) (Oral)   Resp 16   Ht 5' (1.524 m)   Wt 65.8 kg   SpO2 100%   BMI 28.32 kg/m  Physical Exam Constitutional:      General: She is not in acute distress.    Appearance: Normal appearance. She is obese. She is not ill-appearing or diaphoretic.  Eyes:     General: No scleral icterus.    Extraocular Movements: Extraocular movements intact.     Conjunctiva/sclera: Conjunctivae normal.  Cardiovascular:     Rate and Rhythm: Normal rate and regular rhythm.     Pulses: Normal pulses.     Heart sounds: Normal heart sounds. No murmur heard.    No friction rub. No gallop.  Pulmonary:     Effort: Pulmonary effort is normal. No respiratory distress.     Breath sounds: Wheezing present. No rales.  Abdominal:     General: Abdomen is flat. Bowel sounds are normal. There is no distension.     Palpations: Abdomen is soft.     Tenderness: There is no abdominal tenderness.     Comments: RLQ colostomy in place with no warmth, erythema, skin breakdown or other infectious appearance.  Musculoskeletal:     Comments: R>L bilateral LE edema with open  wounds and weeping. RLE with 2+ pitting edema to the thigh. No significant warmth. RLE with ascending erythema but no discrete streaking.  Skin:    General: Skin is warm and dry.  Neurological:     Mental Status: She is alert.     ED Results / Procedures / Treatments   Labs (all labs ordered are listed, but only abnormal results are displayed) Labs Reviewed  BRAIN NATRIURETIC PEPTIDE  CBC  BASIC METABOLIC PANEL    EKG None  Radiology No results found.  Procedures Procedures    Medications Ordered in ED Medications  acetaminophen (TYLENOL) tablet 1,000 mg (has no administration in time range)    ED  Course/ Medical Decision Making/ A&P                             Medical Decision Making LIAHNA SALIDO is a 77 y.o. female with a pmh of cervical cancer s/p pelvic irradiation, rectal cancer s/p colostomy ,bilateral LE edema c/b cellulitis,metabolic syndrome who presents with acute on chronic R>L bilateral LE swelling. Endorses warmth and streaking erythema of the R leg this AM. Recently completed 10 day course of antibiotics for cellulitis and has been treated for this around 4 times. On exam the RLE edema is 2+,pitting, extends to the thigh. No significant warmth, does have erythema ascending but no streaking, afebrile, hds so low concern for cellulitis. Had DVT studies 12/2022 with no DVT. Given prior cervical cancer with pelvic radiation and colon cancer with recent completion of chemo does have risk factors for anatomic compression of venous system as well as blood clots. Has had a BNP in the 150s 1 year ago and echo in 2021 that was unremarkable. Denies orthopnea or pnd. Overall not consistent with left heart failure but could be right heart failure although the asymetric edema would be atypical for this.Overall this is DVT or venous compression, HF, or venous insufficiency. Will get cbc to evaluate any wbc elevation indicative of cellulitis. Will also get BMP to evaluate renal  function for any electrolyte abnormalities that could be seen in CHF and to evaluate renal function for possible contrasted imaging. Will get BNP to evaluate HF. Will get DVT ultrasounds, may proceed to CT study after.   Update: CBC,BMP,BNP grossly unremarkable.DVT studies negative bilaterally. CT pelvis/lower extremities without any large compressive masses or DVT to explain RLE edema. Did have evidence of RLE cellulitis and R inguinal lymph nodes. Unclear if the lymph nodes are causing obstruction and cellulitis or the cellulitis is causing reactive lymphadenopathy and swelling. Discussed with patient completing antibiotic regimen and following up with pcp to get ultrasound of lymph nodes. If reactive they should decrease in size, if not would warrant further investigation with recent history of rectal cancer. Patient safe for discharge.  Amount and/or Complexity of Data Reviewed Labs: ordered. Radiology: ordered.  Risk OTC drugs.    Final Clinical Impression(s) / ED Diagnoses Final diagnoses:  None    Rx / DC Orders ED Discharge Orders     None         Willette Cluster, MD 04/22/23 2320    Rozelle Logan, DO 04/23/23 1556

## 2023-04-23 ENCOUNTER — Other Ambulatory Visit: Payer: Self-pay | Admitting: Gastroenterology

## 2023-04-23 ENCOUNTER — Telehealth: Payer: Self-pay

## 2023-04-23 NOTE — Telephone Encounter (Signed)
Patient's daughter contacted our office to inform us that her mother is currently being treated at the emergency department for right lower extremity cellulitis and right inguinal lymphadenopathy. She requested that the provider be made aware of the situation and also inquired about scheduling an appointment after her mother completes her course of antibiotics. We have confirmed with Truett Perna that it is acceptable to proceed with scheduling the appointment.

## 2023-04-24 ENCOUNTER — Telehealth: Payer: Self-pay | Admitting: *Deleted

## 2023-04-24 NOTE — Telephone Encounter (Signed)
Called daughter to f/u on referral to ostomy nurse. She reports she was seen on Monday and was provided several helpful suggestions to work around her hernia. Was in ER for swollen leg and tested negative for DVT. Was put on antibiotic. Expressed frustration that CT had not been done sooner than 6/5 when lymphadenopathy was seen.

## 2023-04-27 DIAGNOSIS — K435 Parastomal hernia without obstruction or  gangrene: Secondary | ICD-10-CM | POA: Insufficient documentation

## 2023-04-27 DIAGNOSIS — K9400 Colostomy complication, unspecified: Secondary | ICD-10-CM | POA: Insufficient documentation

## 2023-05-01 ENCOUNTER — Other Ambulatory Visit: Payer: Self-pay | Admitting: Oncology

## 2023-05-07 ENCOUNTER — Inpatient Hospital Stay: Payer: Medicare HMO | Attending: Nurse Practitioner | Admitting: Oncology

## 2023-05-07 VITALS — BP 140/58 | HR 56 | Temp 98.1°F | Resp 18 | Wt 153.0 lb

## 2023-05-07 DIAGNOSIS — R590 Localized enlarged lymph nodes: Secondary | ICD-10-CM | POA: Diagnosis present

## 2023-05-07 DIAGNOSIS — C20 Malignant neoplasm of rectum: Secondary | ICD-10-CM | POA: Insufficient documentation

## 2023-05-07 MED ORDER — SULFAMETHOXAZOLE-TRIMETHOPRIM 800-160 MG PO TABS: 1.0000 | ORAL_TABLET | Freq: Two times a day (BID) | ORAL | 0 refills | Status: DC

## 2023-05-07 NOTE — Progress Notes (Signed)
[facility] OFFICE PROGRESS NOTE   Diagnosis: Rectal cancer  INTERVAL HISTORY:   [name] returns prior to scheduled visit.  She was seen in the emergency room on 04/22/2023 with increased, right greater than left, lower extremity edema with erythema and warmth.  A Doppler was negative for DVT.  A CT revealed enlarged inguinal lymph nodes.  She was treated with a course of Bactrim and reports improvement. Good appetite.  No difficulty with bowel or bladder function.  Objective:  Vital signs in last 24 hours:  Weight 153 lb (69.4 kg).    Lymphatics: No cervical, supraclavicular, axillary, or inguinal nodes Resp: Lungs clear bilaterally Cardio: Regular rate and rhythm GI: No hepatosplenomegaly Vascular: Edema throughout the right greater than left lower leg with faint bilateral pretibial erythema, multiple abrasions over the lower legs   Portacath/PICC-without erythema  Lab Results:  Lab Results  Component Value Date   WBC 6.0 04/22/2023   HGB 9.4 (L) 04/22/2023   HCT 32.8 (L) 04/22/2023   MCV 93.2 04/22/2023   PLT 117 (L) 04/22/2023   NEUTROABS 2.9 04/10/2023    CMP  Lab Results  Component Value Date   NA 138 04/22/2023   K 3.5 04/22/2023   CL 109 04/22/2023   CO2 21 (L) 04/22/2023   GLUCOSE 133 (H) 04/22/2023   BUN 18 04/22/2023   CREATININE 1.05 (H) 04/22/2023   CALCIUM 7.9 (L) 04/22/2023   PROT 6.5 04/10/2023   ALBUMIN 4.0 04/10/2023   AST 14 (L) 04/10/2023   ALT 5 04/10/2023   ALKPHOS 144 (H) 04/10/2023   BILITOT 0.3 04/10/2023   GFRNONAA 55 (L) 04/22/2023   GFRAA >60 03/29/2020    Lab Results  Component Value Date   CEA1 28.60 (H) 06/12/2021   CEA 44.01 (H) 02/02/2023     Medications: I have reviewed the patient's current medications.   Assessment/Plan: Rectal cancer Partially obstructing mass beginning at 10 cm from the anal verge on colonoscopy 01/23/2021, biopsy-adenocarcinoma, immunohistochemical stains at [facility]-CK20, CDX-2,  and SATB2 positive, patchy strong staining for p16.  The rectal tumor has a distinct immunohistochemical profile suggesting a primary colorectal tumor as opposed to metastatic cervical cancer. CT abdomen/pelvis 01/18/2021-bilateral hydronephrosis, endometrial fluid collection, prominent stool in the colon MRI pelvis 01/23/2021-tumor at 7.6 cm from the anal verge, abnormal signal bridge in the cervix, mesorectum, and anterior aspect of the rectum-similar to MRI from 2020, MRI stage could not be defined secondary to posttreatment changes in the pelvis 03/18/2021-total abdominal hysterectomy, cystoscopy with insertion of ureteral stents, exploratory laparotomy, aborted low anterior resection, descending loop colostomy--right and left fallopian tubes and ovaries negative for malignancy; excision portion of cervix positive for adenocarcinoma; uterus with invasive moderately differentiated adenocarcinoma of the cervix, HPV associated, invading through full-thickness of the cervix/lower uterine segment and into parametrial tissue, margins of resection received disrupted, cannot be evaluated; remaining uterus with extensive endocervical adenocarcinoma in situ involving lower uterine segment and replacing endometrium.  This carcinoma was felt to be a separate primary from the rectal tumor. PET scan at [facility] 04/11/2021-intense FDG activity in region of known rectal mass.  Hypermetabolic left external iliac and right inguinal lymph nodes.  Minimal FDG uptake and small right supraclavicular lymph node.  No uptake seen in the cervix. 04/23/2021 FNA right inguinal lymph node-negative for malignancy Evaluated by Dr. Lattie Corns 05/21/2021, 05/28/2021-appears to have 2 synchronous malignancies (cervical adenocarcinoma and rectal adenocarcinoma); further surgery which would entail pelvic exenteration not recommended by gynecologic oncology or colorectal surgery.  Full doses of radiotherapy not felt to be feasible.  Recommended 3 months of CAPOX  and then repeat MRI of abdomen/pelvis, chest CT and colonoscopy. Cycle 1 Xeloda 06/13/2021 Cycle 1 FOLFOX 07/08/2021, oxaliplatin dose reduced secondary to pre-existing neuropathy Cycle 2 FOLFOX 07/29/2021, Udenyca Cycle 3 FOLFOX 08/12/2021, Udenyca Cycle 4 FOLFOX 09/02/2021, Udenyca Cycle 5 FOLFOX 09/17/2021, Udenyca CTs at [facility] 10/01/2021-unchanged findings of rectal malignancy status posttreatment.  Marked thickening of the proximal duodenal wall.  Unchanged bilateral inguinal lymph nodes which are not enlarged but avid on prior PET.  Unchanged diffuse bladder wall thickening and perivesicular stranding which may represent radiation cystitis.  New mild right lower lobe bronchial wall thickening, mucus impaction and tree-in-bud pulmonary nodules. Cycle 6 FOLFOX 10/22/2021, Udenyca CT 01/21/2022-stable soft tissue thickening at the lower rectum, slight increase in adjacent stranding no evidence of metastatic disease in the chest, abdomen, or pelvis, interval diverting loop colostomy CT pelvis and lower extremities 04/22/2023-subcutaneous fat stranding in the lower legs bilaterally on the right greater than left, enlarged right inguinal lymph nodes, total left hip arthroplasty with stable acetabular bone erosions, ill-defined soft tissue density in the region of the distal rectum with surrounding fat stranding unchanged     Bilateral hydronephrosis-secondary to urinary retention? Hematometra found on pelvic MRI 01/23/2021, evaluated by gynecologic oncology Cervical cancer November 1994, stage Ib, treated with external beam radiation and brachytherapy at [facility] Diabetes Neuropathy Chronic pain secondary to #6 Recurrent urinary tract infections History of a CVA History of hyperthyroidism G3, P3 COVID-19 infection January 2021 Admission 08/25/2021 with a urinary tract infection-culture negative Anemia secondary to chronic disease, chemotherapy, and rectal bleeding-1 unit packed red blood cells  08/26/2021 Hospital admission 09/23/2021-symptomatic anemia 16.  Admission 10/08/2021 with altered mental status, nausea/vomiting, and left foot ulcers Urine culture 10/08/2021 positive for Pseudomonas aeruginosa EGD 10/12/2021-gastric and duodenal ulcers-not bleeding 17.  Admission 11/29/2021 through 12/02/2021 with COVID-pneumonia 18.  Right lower extremity cellulitis 04/22/2023-Bactrim, repeat course of Bactrim 05/07/2023       Disposition: Ms. Salkowski has a history of cervical and rectal cancer.  She has been maintained off of systemic therapy since a final cycle of FOLFOX in December 2022.  There is no clear evidence of disease progression.  I reviewed the 04/22/2023 CT pelvis/lower extremity images with her.  The enlarged right inguinal lymph nodes may be reactive versus metastases.  The lymph nodes do not explain the lower extremity edema.  She continues to have erythema of the lower legs.  She will complete the course of Bactrim.  I encouraged her to elevate the legs.  I suspect the leg edema is related to previous pelvic radiation.  Ms. Kulman will return for an office visit as scheduled on 06/05/2023.  Thornton Papas, MD  05/07/2023  9:05 AM

## 2023-05-21 ENCOUNTER — Emergency Department (HOSPITAL_COMMUNITY): Payer: Medicare HMO

## 2023-05-21 ENCOUNTER — Emergency Department (HOSPITAL_COMMUNITY)
Admission: EM | Admit: 2023-05-21 | Discharge: 2023-05-21 | Disposition: A | Discharge status: Home / Self Care | Payer: Medicare HMO | Attending: Emergency Medicine | Admitting: Emergency Medicine

## 2023-05-21 DIAGNOSIS — Z794 Long term (current) use of insulin: Secondary | ICD-10-CM | POA: Insufficient documentation

## 2023-05-21 DIAGNOSIS — N3001 Acute cystitis with hematuria: Secondary | ICD-10-CM | POA: Insufficient documentation

## 2023-05-21 DIAGNOSIS — Z85048 Personal history of other malignant neoplasm of rectum, rectosigmoid junction, and anus: Secondary | ICD-10-CM | POA: Diagnosis not present

## 2023-05-21 DIAGNOSIS — Z7982 Long term (current) use of aspirin: Secondary | ICD-10-CM | POA: Diagnosis not present

## 2023-05-21 DIAGNOSIS — R600 Localized edema: Secondary | ICD-10-CM | POA: Diagnosis not present

## 2023-05-21 DIAGNOSIS — R4182 Altered mental status, unspecified: Secondary | ICD-10-CM | POA: Diagnosis present

## 2023-05-21 DIAGNOSIS — I6389 Other cerebral infarction: Secondary | ICD-10-CM | POA: Insufficient documentation

## 2023-05-21 DIAGNOSIS — Z7902 Long term (current) use of antithrombotics/antiplatelets: Secondary | ICD-10-CM | POA: Diagnosis not present

## 2023-05-21 DIAGNOSIS — J984 Other disorders of lung: Secondary | ICD-10-CM | POA: Insufficient documentation

## 2023-05-21 DIAGNOSIS — R29818 Other symptoms and signs involving the nervous system: Secondary | ICD-10-CM | POA: Diagnosis not present

## 2023-05-21 DIAGNOSIS — R0602 Shortness of breath: Secondary | ICD-10-CM | POA: Insufficient documentation

## 2023-05-21 DIAGNOSIS — Z8673 Personal history of transient ischemic attack (TIA), and cerebral infarction without residual deficits: Secondary | ICD-10-CM | POA: Insufficient documentation

## 2023-05-21 DIAGNOSIS — I708 Atherosclerosis of other arteries: Secondary | ICD-10-CM | POA: Insufficient documentation

## 2023-05-21 DIAGNOSIS — G9341 Metabolic encephalopathy: Secondary | ICD-10-CM | POA: Diagnosis not present

## 2023-05-21 DIAGNOSIS — Z79899 Other long term (current) drug therapy: Secondary | ICD-10-CM | POA: Insufficient documentation

## 2023-05-21 DIAGNOSIS — Z7984 Long term (current) use of oral hypoglycemic drugs: Secondary | ICD-10-CM | POA: Diagnosis not present

## 2023-05-21 LAB — CBC WITH DIFFERENTIAL/PLATELET
Abs Immature Granulocytes: 0.02 10*3/uL (ref 0.00–0.07)
Basophils Absolute: 0 10*3/uL (ref 0.0–0.1)
Basophils Relative: 1 %
Eosinophils Absolute: 0.1 10*3/uL (ref 0.0–0.5)
Eosinophils Relative: 2 %
HCT: 37.4 % (ref 36.0–46.0)
Hemoglobin: 10.7 g/dL — ABNORMAL LOW (ref 12.0–15.0)
Immature Granulocytes: 1 %
Lymphocytes Relative: 29 %
Lymphs Abs: 1 10*3/uL (ref 0.7–4.0)
MCH: 26.9 pg (ref 26.0–34.0)
MCHC: 28.6 g/dL — ABNORMAL LOW (ref 30.0–36.0)
MCV: 94 fL (ref 80.0–100.0)
Monocytes Absolute: 0.3 10*3/uL (ref 0.1–1.0)
Monocytes Relative: 9 %
Neutro Abs: 2 10*3/uL (ref 1.7–7.7)
Neutrophils Relative %: 58 %
Platelets: 104 10*3/uL — ABNORMAL LOW (ref 150–400)
RBC: 3.98 MIL/uL (ref 3.87–5.11)
RDW: 16.6 % — ABNORMAL HIGH (ref 11.5–15.5)
WBC: 3.3 10*3/uL — ABNORMAL LOW (ref 4.0–10.5)
nRBC: 0 % (ref 0.0–0.2)

## 2023-05-21 LAB — COMPREHENSIVE METABOLIC PANEL
ALT: 10 U/L (ref 0–44)
AST: 18 U/L (ref 15–41)
Albumin: 3.6 g/dL (ref 3.5–5.0)
Alkaline Phosphatase: 137 U/L — ABNORMAL HIGH (ref 38–126)
Anion gap: 11 (ref 5–15)
BUN: 19 mg/dL (ref 8–23)
CO2: 23 mmol/L (ref 22–32)
Calcium: 8.4 mg/dL — ABNORMAL LOW (ref 8.9–10.3)
Chloride: 105 mmol/L (ref 98–111)
Creatinine, Ser: 0.99 mg/dL (ref 0.44–1.00)
GFR, Estimated: 59 mL/min — ABNORMAL LOW (ref >60)
Glucose, Bld: 228 mg/dL — ABNORMAL HIGH (ref 70–99)
Potassium: 4.1 mmol/L (ref 3.5–5.1)
Sodium: 139 mmol/L (ref 135–145)
Total Bilirubin: 0.6 mg/dL (ref 0.3–1.2)
Total Protein: 7.1 g/dL (ref 6.5–8.1)

## 2023-05-21 LAB — ETHANOL: Alcohol, Ethyl (B): <10 mg/dL

## 2023-05-21 LAB — AMMONIA: Ammonia: 11 umol/L (ref 9–35)

## 2023-05-21 LAB — URINALYSIS, ROUTINE W REFLEX MICROSCOPIC
Bacteria, UA: NONE SEEN
Bilirubin Urine: NEGATIVE
Glucose, UA: NEGATIVE mg/dL
Ketones, ur: NEGATIVE mg/dL
Nitrite: NEGATIVE
Protein, ur: 30 mg/dL — AB
RBC / HPF: >50 RBC/hpf (ref 0–5)
Specific Gravity, Urine: 1.015 (ref 1.005–1.030)
pH: 6 (ref 5.0–8.0)

## 2023-05-21 LAB — LACTIC ACID, PLASMA
Lactic Acid, Venous: 2.1 mmol/L (ref 0.5–1.9)
Lactic Acid, Venous: 2.3 mmol/L (ref 0.5–1.9)
Lactic Acid, Venous: 1.9 mmol/L (ref 0.5–1.9)

## 2023-05-21 LAB — CBG MONITORING, ED: Glucose-Capillary: 175 mg/dL — ABNORMAL HIGH (ref 70–99)

## 2023-05-21 LAB — BLOOD GAS, VENOUS
Acid-base deficit: 3.3 mmol/L — ABNORMAL HIGH (ref 0.0–2.0)
Bicarbonate: 24.3 mmol/L (ref 20.0–28.0)
O2 Saturation: 74.3 %
Patient temperature: 37
pCO2, Ven: 53 mmHg (ref 44–60)
pH, Ven: 7.27 (ref 7.25–7.43)
pO2, Ven: 47 mmHg — ABNORMAL HIGH (ref 32–45)

## 2023-05-21 MED ORDER — SODIUM CHLORIDE 0.9 % IV SOLN
2.0000 g | Freq: Once | INTRAVENOUS | Status: AC
  Administered 2023-05-21: 2 g via INTRAVENOUS
  Filled 2023-05-21: qty 20

## 2023-05-21 MED ORDER — CEPHALEXIN 500 MG PO CAPS: 500.0000 mg | ORAL_CAPSULE | Freq: Four times a day (QID) | ORAL | 0 refills | Status: DC

## 2023-05-21 MED ORDER — GADOBUTROL 1 MMOL/ML IV SOLN
7.0000 mL | Freq: Once | INTRAVENOUS | Status: AC | PRN
  Administered 2023-05-21: 7 mL via INTRAVENOUS

## 2023-05-21 MED ORDER — HEPARIN SOD (PORK) LOCK FLUSH 100 UNIT/ML IV SOLN
500.0000 [IU] | Freq: Once | INTRAVENOUS | Status: AC
  Administered 2023-05-21: 500 [IU]
  Filled 2023-05-21: qty 5

## 2023-05-21 MED ORDER — SODIUM CHLORIDE (PF) 0.9 % IJ SOLN
INTRAMUSCULAR | Status: AC
  Filled 2023-05-21: qty 50

## 2023-05-21 MED ORDER — SODIUM CHLORIDE 0.9 % IV BOLUS
1000.0000 mL | Freq: Once | INTRAVENOUS | Status: AC
  Administered 2023-05-21: 1000 mL via INTRAVENOUS

## 2023-05-21 MED ORDER — IOHEXOL 350 MG/ML SOLN
100.0000 mL | Freq: Once | INTRAVENOUS | Status: AC | PRN
  Administered 2023-05-21: 100 mL via INTRAVENOUS

## 2023-05-21 MED ORDER — METHOCARBAMOL 500 MG PO TABS: 500.0000 mg | ORAL_TABLET | Freq: Two times a day (BID) | ORAL | 0 refills | Status: DC

## 2023-05-21 MED ORDER — SODIUM CHLORIDE (PF) 0.9 % IJ SOLN
INTRAMUSCULAR | Status: AC
  Filled 2023-05-21: qty 100

## 2023-05-21 NOTE — ED Provider Notes (Addendum)
Patient had episode of altered mental status that is undergoing workup.  Follow-up on results and reassess for management and disposition.  At the time of my evaluation the patient, she has made significant improvement.  Patient's daughter is at bedside.  Patient daughter reports that initially patient was extremely confused and staring and poorly responsive.  Not following commands.  Baseline patient is highly functional mostly taking care of her own household and basic needs.  Daughter is nearby and comes over frequently. Physical Exam  BP (!) 138/48   Pulse 66   Temp 98 F (36.7 C) (Oral)   Resp (!) 26   Wt 71.3 kg   SpO2 97%   BMI 30.70 kg/m   Physical Exam Constitutional:      Comments: Patient is awake and alert.  Very mildly somnolent.  Responsive and following commands.  Verbally interactive.  HENT:     Mouth/Throat:     Mouth: Mucous membranes are dry.     Pharynx: Oropharynx is clear.  Eyes:     Extraocular Movements: Extraocular movements intact.     Pupils: Pupils are equal, round, and reactive to light.  Cardiovascular:     Rate and Rhythm: Normal rate and regular rhythm.  Pulmonary:     Effort: Pulmonary effort is normal.     Breath sounds: Normal breath sounds.  Abdominal:     General: There is no distension.     Palpations: Abdomen is soft.     Tenderness: There is no abdominal tenderness. There is no guarding.     Comments: Ostomy bag with brownish-yellow stool present.  Musculoskeletal:     Comments: Patient has 2-3+ edema of the right lower extremity.  The edema is brawny and firm.  It extends up into the thigh.  Less edema on the left.  Left Thigh is soft and pliable.  Erythema pretibial to both legs.  See attached images.  Skin:    General: Skin is warm and dry.  Neurological:     Comments: At this time patient is awake and answering questions appropriately.  She is situationally oriented.  She can extend both hands and hold outstretched.  She has slight  tremor and very subtle asterixis.  Eye movements conjugate.  Patient has ability to elevate each lower extremity but requires significant effort.  She does have significant edema of the right lower extremity and also edema of the left.  Muscular atrophy present.  Psychiatric:        Mood and Affect: Mood normal.     Procedures  Procedures  ED Course / MDM    Medical Decision Making Amount and/or Complexity of Data Reviewed Labs: ordered. Radiology: ordered.  Risk Prescription drug management.   Patient evaluation started at point that she had a CT and CTA completed.  No acute findings identified.  I have reassessed the patient and she has made improvement since hydration.  She was empirically treated with Rocephin.  By the time of my evaluation the patient is awake alert and following commands.  She does have subtle asterixis.  I have extensively reviewed EMR and she does have history of rectal cancer.  Given description of symptoms consideration given to stroke or metastatic lesion.  I did proceed with MRI with and without contrast.  MRI has returned without any acute findings thus ruling out metastatic lesions or stroke.  Patient continue to make significant improvement with hydration.  Returning to her baseline.  Urinalysis grossly positive for blood.  Patient does  endorse dysuria.  Her daughter notes that she has had significant mental status change in conjunction with UTI previously.  Patient was given 2 g of Rocephin IV.  Will add urine culture and plan to treat with Keflex on outpatient basis.  Patient's  lower extremity exam shows significant erythema and edema of the right lower extremity.  This was previously evaluated with DVT study and ultimately no clot present but identified lymphadenopathy in the groin.  Patient had CT pelvis with runoff [phone].  This identified known and stable stranding in the rectal area associated with rectal cancer.  Acute findings were not identified  but patient had lymphadenopathy in the right groin.  This finding coincides with lymphedema.  Patient's daughter reports that she had a lot of erythema in the lower extremity at the time of evaluation which did improve with antibiotic therapy.  She reports now the erythema is down below the level of her knee at least.  At this point I suspect chronic lymphedema plus or minus intermittent cellulitis.  At this point, patient will be treated with Keflex for UTI which hopefully will also cover for cellulitis versus chronic inflammatory response associated with lymphedema.  Reviewing the patient's medications, she has been on baclofen longstanding.  She takes this twice daily.  This has been the solution for longstanding problems with spasms and cramps in her extremities.  Her daughter reports that it seems to help them although has not completely eliminated.  Apparently the symptoms have been pretty severe and debilitating at times.  Given the potential for baclofen to cause significant mental status change and neurologic symptoms, we have made a plan to discontinue the baclofen and try a low-dose of Robaxin twice daily if needed.  I have encouraged them to try to avoid these medications if possible and follow-up with neurology to discuss possible therapy options.  They describe the symptoms of as having had a lot of workup and evaluation by multiple physicians without specific diagnosis for etiology.  1030, patient has eaten a full meal.  She has ambulated at her baseline with her walker.  She is very alert and interactive and well in appearance.  Currently stable for discharge.  Patient has good family support and assistance for home monitoring.       Arby Barrette, MD 05/21/23 1100    Arby Barrette, MD 05/21/23 1108

## 2023-05-21 NOTE — Consult Note (Signed)
[facility] Tele[facility]rology Consult Services   Patient Name:   [name], [name] Date of Birth:   06-04-1946 Identification Number:   MRN - [id number] Date of Service:   05/21/2023 05:47:05  Diagnosis:       G93.41 - Encephalopathy Metabolic  Impression:      77 years old female whom the stroke code is called for encephalopathy.  - NIHSS 21  - CTH: no acute finding  - CTA: no LVO  - CTP: no perfusion defect    Impression: Given the clinical history and examination, presentation most likely consistent with toxic metabolic encephalopathy    Recommendations:  - Q4h neurochecks w/ telemetry    - Obtain UA w/ reflex, ammonia, ABG, CMP    - Obtain MRI brain w/ and w/o    - Delirium precaution    - PT/OT    - Neurology to follow    ------------------------------------------------------------------------------  Advanced Imaging: CTA Head and Neck Completed.  CTP Completed.  LVO:No  Patient in not a candidate for NIR   Metrics: Last Known Well: 05/20/2023 16:00:00 [facility] Notification Time: 05/21/2023 05:45:32 Arrival Time: 05/21/2023 04:47:00 Stamp Time: 05/21/2023 05:47:05 Initial Response Time: 05/21/2023 05:52:13 Symptoms: encephalopathy. Initial patient interaction: 05/21/2023 05:56:10 NIHSS Assessment Completed: 05/21/2023 05:57:19 Patient is not a candidate for Thrombolytic. Thrombolytic Medical Decision: 05/21/2023 05:57:21 Patient was not deemed candidate for Thrombolytic because of following reasons: Last Well Known Above 4.5 Hours.  I personally Reviewed the CT Head and it Showed  Primary Provider Notified of Diagnostic Impression and Management Plan on: 05/21/2023 06:34:25    ------------------------------------------------------------------------------  History of Present Illness: Patient is a 77 year old Female.  Patient was brought by EMS for symptoms of encephalopathy. 77 years old female whom the stroke code is  called for encephalopathy.  Patient was last seen normal at around 4 PM yesterday however was found by the family this morning to be completely encephalopathic as patient is awake and tracking the examiner but not following commands or verbalizing any answers. No seizure like activity witnessed.  On presentation, hemodynamically stable. NIHSS 21 as she is awake but not following commands or verbalizing any answers. CTH with no acute finding. CTA with no LVO. CTP with no defect.   Past Medical History:      Stroke      There is no history of Diabetes Mellitus [place] PMH:  cervical/rectal cancer  Medications:  No Anticoagulant use  No Antiplatelet use Reviewed EMR for current medications  Allergies:  Reviewed  Social History: Patient Is: Married Smoking: No Alcohol Use: No Drug Use: No  Family History:  There is no family history of premature cerebrovascular disease pertinent to this consultation  ROS : 14 Points Review of Systems was performed and was negative except mentioned in HPI.  Past Surgical History: There Is No Surgical History Contributory To Today's Visit    Examination: BP(175/56), Pulse(65), 1A: Level of Consciousness - Movements to Pain + 2 1B: Ask Month and Age - Aphasic + 2 1C: Blink Eyes & Squeeze Hands - Performs 0 Tasks + 2 2: Test Horizontal Extraocular Movements - Normal + 0 3: Test Visual Fields - No Visual Loss + 0 4: Test Facial Palsy (Use Grimace if Obtunded) - Normal symmetry + 0 5A: Test Left Arm Motor Drift - Drift, hits bed + 2 5B: Test Right Arm Motor Drift - Drift, hits bed + 2 6A: Test Left Leg Motor Drift - No Effort Against Gravity + 3 6B: Test Right Leg  Motor Drift - No Effort Against Gravity + 3 7: Test Limb Ataxia (FNF/Heel-Shin) - No Ataxia + 0 8: Test Sensation - Normal; No sensory loss + 0 9: Test Language/Aphasia - Mute/Global Aphasia: No Usable Speech/Auditory Comprehension + 3 10: Test Dysarthria - Mute/Anarthric + 2 11:  Test Extinction/Inattention - No abnormality + 0  NIHSS Score: 21   Pre-Morbid Modified Rankin Scale: 1 Points = No significant disability despite symptoms; able to carry out all usual duties and activities  Spoke with : Dr. Adela Lank  This consult was conducted in real time using interactive audio and Immunologist. Patient was informed of the technology being used for this visit and agreed to proceed. Patient located in hospital and provider located at home/office setting.   Patient is being evaluated for possible acute neurologic impairment and high probability of imminent or life-threatening deterioration. I spent total of 55 minutes providing care to this patient, including time for face to face visit via telemedicine, review of medical records, imaging studies and discussion of findings with providers, the patient and/or family.   Dr Corinna Capra   [facility] For Inpatient follow-up with [facility] physician please call [facility] [phone]. This is not an outpatient service. Post hospital discharge, please contact hospital directly.  Please do not communicate with [facility] physicians via secure chat. If you have any questions, Please contact [facility]. Please call or reconsult our service if there are any clinical or diagnostic changes.

## 2023-05-21 NOTE — ED Notes (Signed)
Patient to MRI.

## 2023-05-21 NOTE — ED Notes (Signed)
Patient back from MRI.

## 2023-05-21 NOTE — ED Provider Notes (Signed)
[place] EMERGENCY DEPARTMENT AT [facility] Provider Note   CSN: [id number] Arrival date & time: 05/21/23  0447     History  Chief Complaint  Patient presents with   Shortness of Breath    Pt BIBA w/ SOB and receiving DuoNeb on arrival after family called reporting confusion and concerns for a UTI, that started PTA of medics.    [name] is a 77 y.o. female.  77 yo F with a chief complaints of altered mental status.  Patient was brought here with EMS.  Per the family she gets this way frequently when she has urinary tract infection.  Patient unfortunately is noncommunicative currently.  Level 5 caveat altered mental status.   Shortness of Breath      Home Medications Prior to Admission medications   Medication Sig Start Date End Date Taking? Authorizing Provider  cephALEXin (KEFLEX) 500 MG capsule Take 1 capsule (500 mg total) by mouth 4 (four) times daily. 05/21/23  Yes Arby Barrette, MD  methocarbamol (ROBAXIN) 500 MG tablet Take 1 tablet (500 mg total) by mouth 2 (two) times daily. 05/21/23  Yes Arby Barrette, MD  ascorbic acid (VITAMIN C) 500 MG tablet Take 1 tablet (500 mg total) by mouth daily. 12/03/21   Rolly Salter, MD  aspirin 81 MG EC tablet Take 1 tablet (81 mg total) by mouth daily. RESUME AFTER 1 WEEK 09/03/21   Osvaldo Shipper, MD  atorvastatin (LIPITOR) 40 MG tablet Take 40 mg by mouth every morning. 12/15/19   [provider]  baclofen (LIORESAL) 10 MG tablet Take 10 mg by mouth 4 (four) times daily as needed for muscle spasms. 03/22/21   [provider]  clopidogrel (PLAVIX) 75 MG tablet Take 1 tablet (75 mg total) by mouth at bedtime. 10/12/21   Willeen Niece, MD  docusate sodium (COLACE) 100 MG capsule Take 100 mg by mouth 2 (two) times daily.    [provider]  feeding supplement, GLUCERNA SHAKE, (GLUCERNA SHAKE) LIQD Take 237 mLs by mouth 3 (three) times daily between meals. 12/02/21   Rolly Salter, MD   furosemide (LASIX) 20 MG tablet Take 20 mg by mouth daily. 11/19/21   [provider]  gabapentin (NEURONTIN) 300 MG capsule Take 300-600 mg by mouth See admin instructions. Take one capsule (300 mg) by mouth every morning and two capsules (600 mg) at night    [provider]  gemfibrozil (LOPID) 600 MG tablet Take 600 mg by mouth 2 (two) times daily.    [provider]  insulin degludec (TRESIBA FLEXTOUCH) 100 UNIT/ML FlexTouch Pen Inject 10 Units into the skin daily. 08/28/21   Osvaldo Shipper, MD  levothyroxine (SYNTHROID) 125 MCG tablet Take 1 tablet (125 mcg total) by mouth daily at 6 (six) AM. 08/29/21 05/07/23  Osvaldo Shipper, MD  MAGNESIUM-OXIDE 400 (240 Mg) MG tablet Take 1 tablet by mouth twice daily 05/01/23   Ladene Artist, MD  melatonin 5 MG TABS Take 1 tablet (5 mg total) by mouth at bedtime. 12/02/21   Rolly Salter, MD  metFORMIN (GLUCOPHAGE) 500 MG tablet Take 500 mg by mouth 2 (two) times daily. 02/01/21   [provider]  Multiple Vitamin (MULTIVITAMIN WITH MINERALS) TABS tablet Take 1 tablet by mouth daily. 12/03/21   Rolly Salter, MD  nystatin (MYCOSTATIN/NYSTOP) powder Apply 1 Application topically 2 (two) times daily. To groin rash 11/11/22   Ladene Artist, MD  ondansetron (ZOFRAN) 8 MG tablet Take 1  tablet (8 mg total) by mouth every 6 (six) hours as needed for nausea or vomiting. Patient not taking: Reported on 02/02/2023 10/31/21   Esterwood, Amy S, PA-C  pantoprazole (PROTONIX) 40 MG tablet Take 1 tablet by mouth twice daily 12/15/22   Tressia Danas, MD  polyethylene glycol powder (GLYCOLAX/MIRALAX) 17 GM/SCOOP powder Take 17 g by mouth daily.    [provider]  prochlorperazine (COMPAZINE) 10 MG tablet Take 10 mg by mouth every 6 (six) hours as needed for nausea or vomiting. Patient not taking: Reported on 02/02/2023    [provider]  sertraline (ZOLOFT) 50 MG tablet Take 50 mg by mouth daily.    [provider]  sulfamethoxazole-trimethoprim (BACTRIM DS) 800-160 MG tablet Take 1 tablet by mouth 2 (two) times daily. 05/07/23   Ladene Artist, MD  triamcinolone cream (KENALOG) 0.1 % Apply 1 application  topically 2 (two) times daily as needed (groin rash). Mix with nystatin cream Patient not taking: Reported on 02/02/2023 09/19/20   [provider]  XTAMPZA ER 9 MG C12A Take 1 capsule by mouth 2 (two) times daily. 10/31/21   Esterwood, Amy S, PA-C      Allergies    Codeine    Review of Systems   Review of Systems  Respiratory:  Positive for shortness of breath.     Physical Exam Updated Vital Signs BP (!) 145/48   Pulse 67   Temp 98 F (36.7 C) (Oral)   Resp 15   Wt 71.3 kg   SpO2 99%   BMI 30.70 kg/m  Physical Exam Vitals and nursing note reviewed.  Constitutional:      General: She is not in acute distress.    Appearance: She is well-developed. She is not diaphoretic.  HENT:     Head: Normocephalic and atraumatic.  Eyes:     Pupils: Pupils are equal, round, and reactive to light.  Cardiovascular:     Rate and Rhythm: Normal rate and regular rhythm.     Heart sounds: No murmur heard.    No friction rub. No gallop.  Pulmonary:     Effort: Pulmonary effort is normal.     Breath sounds: No wheezing or rales.     Comments: Diminished breath sounds in all fields Abdominal:     General: There is no distension.     Palpations: Abdomen is soft.     Tenderness: There is no abdominal tenderness.  Musculoskeletal:        General: No tenderness.     Cervical back: Normal range of motion and neck supple.     Right lower leg: Edema present.     Left lower leg: Edema present.     Comments: Bilateral lower extremity edema right greater than left with some erythema that is warm to the touch up to the midshin.  No obvious focal area of fluctuance or induration.  Skin:    General: Skin is warm and dry.  Neurological:     Mental Status: She is alert and oriented to  person, place, and time.  Psychiatric:        Behavior: Behavior normal.     ED Results / Procedures / Treatments   Labs (all labs ordered are listed, but only abnormal results are displayed) Labs Reviewed  COMPREHENSIVE METABOLIC PANEL - Abnormal; Notable for the following components:      Result Value   Glucose, Bld 228 (*)    Calcium 8.4 (*)    Alkaline  Phosphatase 137 (*)    GFR, Estimated 59 (*)    All other components within normal limits  LACTIC ACID, PLASMA - Abnormal; Notable for the following components:   Lactic Acid, Venous 2.1 (*)    All other components within normal limits  CBC WITH DIFFERENTIAL/PLATELET - Abnormal; Notable for the following components:   WBC 3.3 (*)    Hemoglobin 10.7 (*)    MCHC 28.6 (*)    RDW 16.6 (*)    Platelets 104 (*)    All other components within normal limits  URINALYSIS, ROUTINE W REFLEX MICROSCOPIC - Abnormal; Notable for the following components:   Color, Urine RED (*)    APPearance HAZY (*)    Hgb urine dipstick LARGE (*)    Protein, ur 30 (*)    Leukocytes,Ua SMALL (*)    Non Squamous Epithelial 0-5 (*)    All other components within normal limits  BLOOD GAS, VENOUS - Abnormal; Notable for the following components:   pO2, Ven 47 (*)    Acid-base deficit 3.3 (*)    All other components within normal limits  LACTIC ACID, PLASMA - Abnormal; Notable for the following components:   Lactic Acid, Venous 2.3 (*)    All other components within normal limits  CBG MONITORING, ED - Abnormal; Notable for the following components:   Glucose-Capillary 175 (*)    All other components within normal limits  CULTURE, BLOOD (ROUTINE X 2)  CULTURE, BLOOD (ROUTINE X 2)  URINE CULTURE  AMMONIA  ETHANOL  LACTIC ACID, PLASMA    EKG EKG Interpretation Date/Time:  Thursday May 21 2023 05:01:15 EDT Ventricular Rate:  59 PR Interval:    QRS Duration:  111 QT Interval:  485 QTC Calculation: 481 R Axis:   67  Text Interpretation: Normal  sinus rhythm No significant change since last tracing Confirmed by Melene Plan [phone]) on 05/21/2023 5:08:44 AM  Radiology MR Brain W and Wo Contrast  Result Date: 05/21/2023 CLINICAL DATA:  Metastatic disease evaluation. Rule out acute multiple sclerosis versus metastatic disease versus CVA. History of rectal cancer. EXAM: MRI HEAD WITHOUT AND WITH CONTRAST TECHNIQUE: Multiplanar, multiecho pulse sequences of the brain and surrounding structures were obtained without and with intravenous contrast. CONTRAST:  OMNIPAQUE IOHEXOL 350 MG/ML SOLN, 7mL GADAVIST GADOBUTROL 1 MMOL/ML IV SOLN COMPARISON:  Head CT and CTA from earlier today FINDINGS: Brain: No acute infarction, hemorrhage, hydrocephalus, extra-axial collection or mass lesion. No abnormal enhancement or swelling to suggest metastatic disease. Chronic perforator infarct in the left basal ganglia and corona radiata. Age normal brain volume. Vascular: Normal flow voids and vascular enhancements Skull and upper cervical spine: Normal marrow signal Sinuses/Orbits: Bilateral cataract resection. IMPRESSION: 1. No acute finding or evidence of metastatic disease. 2. Chronic perforator infarct at the left basal ganglia. Electronically Signed   By: Tiburcio Pea M.D.   On: 05/21/2023 08:49   CT ANGIO HEAD NECK W WO CM W PERF (CODE STROKE)  Result Date: 05/21/2023 CLINICAL DATA:  Confusion and unresponsiveness. EXAM: CT ANGIOGRAPHY HEAD AND NECK CT PERFUSION BRAIN TECHNIQUE: Multidetector CT imaging of the head and neck was performed using the standard protocol during bolus administration of intravenous contrast. Multiplanar CT image reconstructions and MIPs were obtained to evaluate the vascular anatomy. Carotid stenosis measurements (when applicable) are obtained utilizing NASCET criteria, using the distal internal carotid diameter as the denominator. Multiphase CT imaging of the brain was performed following IV bolus contrast injection. Subsequent  parametric perfusion maps were  calculated using RAPID software. RADIATION DOSE REDUCTION: This exam was performed according to the departmental dose-optimization program which includes automated exposure control, adjustment of the mA and/or kV according to patient size and/or use of iterative reconstruction technique. CONTRAST:  OMNIPAQUE IOHEXOL 350 MG/ML SOLN COMPARISON:  Head CT from earlier today. CTA of the head neck 09/30/2020 FINDINGS: CTA NECK FINDINGS Aortic arch: Atheromatous plaque with 3 vessel branching. Right carotid system: Carotid endarterectomy. Mild narrowing at the proximal anastomosis. No ulceration. Left carotid system: Diffuse atheromatous plaque, primarily calcified and greatest at the level of the common carotid. No flow limiting stenosis or ulceration Vertebral arteries: Proximal subclavian atherosclerosis with up to 60% narrowing at the left subclavian origin. Left vertebral atheromatous narrowings measuring 50% at the origin and 60% at the proximal V2 segment. 60% narrowing due to calcified plaque at the proximal right V2 segment. No dissection or beading. Skeleton: Ordinary cervical spine degeneration. Other neck: No acute or aggressive finding Upper chest: Clear apical lungs. Review of the MIP images confirms the above findings CTA HEAD FINDINGS Anterior circulation: Heavy atheromatous calcification of the carotid siphons. No flow reducing stenosis. No branch occlusion, beading, or aneurysm. Posterior circulation: Vertebral and basilar arteries are smoothly contoured and widely patent. No branch occlusion, beading, or aneurysm. Venous sinuses: Diffusely patent Anatomic variants: None significant Review of the MIP images confirms the above findings CT Brain Perfusion Findings: ASPECTS: 10 CBF (<30%) Volume: 0mL Perfusion (Tmax>6.0s) volume: 70mL-but along the right temporal bone and attributed to artifact IMPRESSION: 1. No emergent vascular finding.  Noncontributory CT perfusion. 2.  Atherosclerosis especially affecting arteries in the neck with 50-60% narrowing of the left subclavian and bilateral proximal vertebral arteries. 3. Right carotid endarterectomy. Electronically Signed   By: Tiburcio Pea M.D.   On: 05/21/2023 07:00   CT HEAD CODE STROKE WO CONTRAST  Result Date: 05/21/2023 CLINICAL DATA:  Code stroke.  Confusion EXAM: CT HEAD WITHOUT CONTRAST TECHNIQUE: Contiguous axial images were obtained from the base of the skull through the vertex without intravenous contrast. RADIATION DOSE REDUCTION: This exam was performed according to the departmental dose-optimization program which includes automated exposure control, adjustment of the mA and/or kV according to patient size and/or use of iterative reconstruction technique. COMPARISON:  11/07/2021 FINDINGS: Brain: No evidence of acute infarction, hemorrhage, hydrocephalus, extra-axial collection or mass lesion/mass effect. Chronic perforator infarct at the left basal ganglia and corona radiata. Age normal brain volume. Vascular: No hyperdense vessel or unexpected calcification. Skull: Normal. Negative for fracture or focal lesion. Sinuses/Orbits: No acute finding. Other: Prelim sent in epic chat. ASPECTS [facility] Stroke Program Early CT Score) - Ganglionic level infarction (caudate, lentiform nuclei, internal capsule, insula, M1-M3 cortex): 7 - Supraganglionic infarction (M4-M6 cortex): 3 Total score (0-10 with 10 being normal): 10 IMPRESSION: 1. No acute or interval finding. 2. Chronic perforator infarct at the left basal ganglia. Electronically Signed   By: Tiburcio Pea M.D.   On: 05/21/2023 06:16   DG Chest Port 1 View  Result Date: 05/21/2023 CLINICAL DATA:  Altered mental status EXAM: PORTABLE CHEST 1 VIEW COMPARISON:  11/29/2021 FINDINGS: Normal heart size and mediastinal contours. Porta catheter on the right with tip at the upper cavoatrial junction. Generalized interstitial coarsening, likely bronchitic. No edema,  effusion, or pneumothorax. IMPRESSION: Chronic lung disease.  No acute finding. Electronically Signed   By: Tiburcio Pea M.D.   On: 05/21/2023 05:36    Procedures .Critical Care  Performed by: Melene Plan, DO Authorized by: Melene Plan,  DO   Critical care provider statement:    Critical care time (minutes):  35   Critical care time was exclusive of:  Separately billable procedures and treating other patients   Critical care was time spent personally by me on the following activities:  Development of treatment plan with patient or surrogate, discussions with consultants, evaluation of patient's response to treatment, examination of patient, ordering and review of laboratory studies, ordering and review of radiographic studies, ordering and performing treatments and interventions, pulse oximetry, re-evaluation of patient's condition and review of old charts   Care discussed with: admitting provider       Medications Ordered in ED Medications  sodium chloride 0.9 % bolus 1,000 mL (0 mLs Intravenous Stopped 05/21/23 1142)  iohexol (OMNIPAQUE) 350 MG/ML injection 100 mL (100 mLs Intravenous Contrast Given 05/21/23 0618)  cefTRIAXone (ROCEPHIN) 2 g in sodium chloride 0.9 % 100 mL IVPB (0 g Intravenous Stopped 05/21/23 0747)  gadobutrol (GADAVIST) 1 MMOL/ML injection 7 mL (7 mLs Intravenous Contrast Given 05/21/23 0827)  sodium chloride 0.9 % bolus 1,000 mL (0 mLs Intravenous Stopped 05/21/23 1129)  heparin lock flush 100 unit/mL (500 Units Intracatheter Given 05/21/23 1130)    ED Course/ Medical Decision Making/ A&P                             Medical Decision Making Amount and/or Complexity of Data Reviewed Labs: ordered. Radiology: ordered.  Risk Prescription drug management.   77 yo F with a chief complaints of altered mental status.  She reportedly has had difficulty breathing as well.  There is also some concern that she may have a urinary tract infection as she has presented similarly in  the past.  She is very confused for me.  Unable to provide any history.  My record review she has been struggling with right lower extremity edema greater than left.  She has a mild erythema to that leg and is warm to the touch.  Will obtain a laboratory evaluation chest x-ray UA CT of the head.  Patient's daughter is arrived and provides further history.  Tells me that yesterday she was normal but was thought to be a little bit more sleepy than normal.  She had a normal dinner last night and family feels like she was normal last night.  Her daughter was notified that they tried to wake her up about 3:00 and noted that she was a bit agitated and was much more confused than normal.  What I have seen on my exam is completely different than what the family still prior to EMS.  I think that she is drastically changed.  She is now staring a bit off to the right side and is flaccid with bilateral upper extremities and stiff with bilateral lowers.  I discussed this with Dr. Derry Lory, based on my description of the exam and history recommended activating as a code stroke getting a CT head and CT angiogram of the head and neck.  CT of the independently interpreted by me without intercranial hemorrhage.  Discussed this with the radiologist.  CT angio of the head and neck also negative for LVO.  Signed out to Dr. Clarice Pole, I suspect likely admission for altered mental status and possible urinary tract infection.  She was covered with Rocephin.  The patients results and plan were reviewed and discussed.   Any x-rays performed were independently reviewed by myself.   Differential diagnosis were considered  with the presenting HPI.  Medications  sodium chloride 0.9 % bolus 1,000 mL (0 mLs Intravenous Stopped 05/21/23 1142)  iohexol (OMNIPAQUE) 350 MG/ML injection 100 mL (100 mLs Intravenous Contrast Given 05/21/23 0618)  cefTRIAXone (ROCEPHIN) 2 g in sodium chloride 0.9 % 100 mL IVPB (0 g Intravenous Stopped 05/21/23  0747)  gadobutrol (GADAVIST) 1 MMOL/ML injection 7 mL (7 mLs Intravenous Contrast Given 05/21/23 0827)  sodium chloride 0.9 % bolus 1,000 mL (0 mLs Intravenous Stopped 05/21/23 1129)  heparin lock flush 100 unit/mL (500 Units Intracatheter Given 05/21/23 1130)    Vitals:   05/21/23 0840 05/21/23 1000 05/21/23 1042 05/21/23 1130  BP: (!) 144/53 (!) 138/48  (!) 145/48  Pulse: 66 66  67  Resp: 20 (!) 26  15  Temp:   98 F (36.7 C)   TempSrc:   Oral   SpO2: 100% 97%  99%  Weight:        Final diagnoses:  Altered mental status, unspecified altered mental status type  Acute cystitis with hematuria           Final Clinical Impression(s) / ED Diagnoses Final diagnoses:  Altered mental status, unspecified altered mental status type  Acute cystitis with hematuria    Rx / DC Orders ED Discharge Orders          Ordered    cephALEXin (KEFLEX) 500 MG capsule  4 times daily        05/21/23 1042    methocarbamol (ROBAXIN) 500 MG tablet  2 times daily        05/21/23 1042              Melene Plan, DO 05/21/23 2302

## 2023-05-21 NOTE — ED Notes (Signed)
Critical Lactic 2.1, reported by Kyra Searles. Read back and confirmed.

## 2023-05-21 NOTE — ED Notes (Signed)
Patient given orange juice and a ham sandwich.

## 2023-05-21 NOTE — Discharge Instructions (Signed)
1.  You are being treated for urinary tract infection.  Start taking Keflex tomorrow morning as prescribed.  You are given an IV dose of antibiotic in the emergency department to start your treatment.  A urine culture has been done.  You will be called if there is a need to change antibiotics or other findings. 2.  Your episode of confusion has resolved.  It is unclear exactly what caused it.  Your CT scans and MRIs do not show any evidence of a stroke.  It may be due to your medication called baclofen.  Or it may be due to urinary tract infection.  At this time, do not take any more baclofen.  You may try Robaxin in the morning and evening as prescribed.  If you are not having cramps or spasms, try to avoid taking any medications for it.  Follow-up with your neurologist as soon as possible to discuss management of spasms which you have had for a long time and possible appropriate management strategies. 3.  Return to emergency department immediately if you have new or worsening symptoms.

## 2023-05-21 NOTE — ED Notes (Signed)
Called Code Stroke to [phone] @5 :40am

## 2023-05-21 NOTE — ED Triage Notes (Signed)
Pt BIBA w/ SOB and receiving DuoNeb on arrival after family called reporting confusion and concerns for a UTI, that started PTA of medics.

## 2023-05-22 LAB — CULTURE, BLOOD (ROUTINE X 2)

## 2023-05-22 LAB — URINE CULTURE: Culture: <10000 — AB

## 2023-05-24 LAB — CULTURE, BLOOD (ROUTINE X 2)
Culture: NO GROWTH
Culture: NO GROWTH
Special Requests: ADEQUATE

## 2023-05-25 LAB — CULTURE, BLOOD (ROUTINE X 2)

## 2023-06-05 ENCOUNTER — Inpatient Hospital Stay: Payer: Medicare HMO | Attending: Nurse Practitioner

## 2023-06-05 ENCOUNTER — Inpatient Hospital Stay: Payer: Medicare HMO

## 2023-06-05 ENCOUNTER — Inpatient Hospital Stay: Payer: Medicare HMO | Admitting: Oncology

## 2023-06-05 VITALS — BP 136/82 | HR 56 | Temp 98.1°F | Resp 18 | Wt 154.0 lb

## 2023-06-05 DIAGNOSIS — C20 Malignant neoplasm of rectum: Secondary | ICD-10-CM | POA: Insufficient documentation

## 2023-06-05 DIAGNOSIS — Z8541 Personal history of malignant neoplasm of cervix uteri: Secondary | ICD-10-CM | POA: Diagnosis not present

## 2023-06-05 LAB — CMP (CANCER CENTER ONLY)
ALT: 5 U/L (ref 0–44)
AST: 14 U/L — ABNORMAL LOW (ref 15–41)
Albumin: 4.1 g/dL (ref 3.5–5.0)
Alkaline Phosphatase: 105 U/L (ref 38–126)
Anion gap: 7 (ref 5–15)
BUN: 21 mg/dL (ref 8–23)
CO2: 26 mmol/L (ref 22–32)
Calcium: 9 mg/dL (ref 8.9–10.3)
Chloride: 110 mmol/L (ref 98–111)
Creatinine: 0.89 mg/dL (ref 0.44–1.00)
GFR, Estimated: >60 mL/min (ref >60)
Glucose, Bld: 120 mg/dL — ABNORMAL HIGH (ref 70–99)
Potassium: 4.6 mmol/L (ref 3.5–5.1)
Sodium: 143 mmol/L (ref 135–145)
Total Bilirubin: 0.4 mg/dL (ref 0.3–1.2)
Total Protein: 6.3 g/dL — ABNORMAL LOW (ref 6.5–8.1)

## 2023-06-05 LAB — CBC WITH DIFFERENTIAL (CANCER CENTER ONLY)
Abs Immature Granulocytes: 0.01 10*3/uL (ref 0.00–0.07)
Basophils Absolute: 0 10*3/uL (ref 0.0–0.1)
Basophils Relative: 1 %
Eosinophils Absolute: 0.1 10*3/uL (ref 0.0–0.5)
Eosinophils Relative: 3 %
HCT: 31.8 % — ABNORMAL LOW (ref 36.0–46.0)
Hemoglobin: 9.5 g/dL — ABNORMAL LOW (ref 12.0–15.0)
Immature Granulocytes: 0 %
Lymphocytes Relative: 25 %
Lymphs Abs: 1 10*3/uL (ref 0.7–4.0)
MCH: 27.5 pg (ref 26.0–34.0)
MCHC: 29.9 g/dL — ABNORMAL LOW (ref 30.0–36.0)
MCV: 92.2 fL (ref 80.0–100.0)
Monocytes Absolute: 0.4 10*3/uL (ref 0.1–1.0)
Monocytes Relative: 11 %
Neutro Abs: 2.3 10*3/uL (ref 1.7–7.7)
Neutrophils Relative %: 60 %
Platelet Count: 138 10*3/uL — ABNORMAL LOW (ref 150–400)
RBC: 3.45 MIL/uL — ABNORMAL LOW (ref 3.87–5.11)
RDW: 16.3 % — ABNORMAL HIGH (ref 11.5–15.5)
WBC Count: 3.8 10*3/uL — ABNORMAL LOW (ref 4.0–10.5)
nRBC: 0 % (ref 0.0–0.2)

## 2023-06-05 LAB — MAGNESIUM: Magnesium: 1.8 mg/dL (ref 1.7–2.4)

## 2023-06-05 MED ORDER — HEPARIN SOD (PORK) LOCK FLUSH 100 UNIT/ML IV SOLN
500.0000 [IU] | Freq: Once | INTRAVENOUS | Status: AC
  Administered 2023-06-05: 500 [IU] via INTRAVENOUS

## 2023-06-05 MED ORDER — SODIUM CHLORIDE 0.9% FLUSH
10.0000 mL | Freq: Once | INTRAVENOUS | Status: AC
  Administered 2023-06-05: 10 mL via INTRAVENOUS

## 2023-06-05 NOTE — Progress Notes (Unsigned)
[facility] OFFICE PROGRESS NOTE   Diagnosis: Rectal Cancer  INTERVAL HISTORY:   [name] returns as scheduled.  She reports improvement in leg edema and erythema after the course of antibiotics.  She was seen in the ER on 7/4 with altered [name].  A brain CT and MRI revealed no acute finding. She was treated for a possible UTI.  No recurrence of the confusion. She is here today with her daughter.  Objective:  Vital signs in last 24 hours:  Blood pressure 136/82, pulse (!) 56, temperature 98.1 F (36.7 C), temperature source Oral, resp. rate 18, weight 154 lb (69.9 kg), SpO2 100%.    Lymphatics: no inguinal nodes Resp: lungs clear bilaterally Cardio: RRR LO:[id number], LLQ colostomy Vascular: edema throughout the rt. Greater than left leg Neuro: alert and oriented Skin: faint erythema at the bilateral lower legs  Portacath/PICC-without erythema  Lab Results:  Lab Results  Component Value Date   WBC 3.8 (L) 06/05/2023   HGB 9.5 (L) 06/05/2023   HCT 31.8 (L) 06/05/2023   MCV 92.2 06/05/2023   PLT 138 (L) 06/05/2023   NEUTROABS 2.3 06/05/2023    CMP  Lab Results  Component Value Date   NA 143 06/05/2023   K 4.6 06/05/2023   CL 110 06/05/2023   CO2 26 06/05/2023   GLUCOSE 120 (H) 06/05/2023   BUN 21 06/05/2023   CREATININE 0.89 06/05/2023   CALCIUM 9.0 06/05/2023   PROT 6.3 (L) 06/05/2023   ALBUMIN 4.1 06/05/2023   AST 14 (L) 06/05/2023   ALT 5 06/05/2023   ALKPHOS 105 06/05/2023   BILITOT 0.4 06/05/2023   GFRNONAA >60 06/05/2023   GFRAA >60 03/29/2020    Lab Results  Component Value Date   CEA1 28.60 (H) 06/12/2021   CEA 44.01 (H) 02/02/2023    Medications: I have reviewed the patient's current medications.   Assessment/Plan: Rectal cancer Partially obstructing mass beginning at 10 cm from the anal verge on colonoscopy 01/23/2021, biopsy-adenocarcinoma, immunohistochemical stains at [facility]CK20, CDX-2, and SATB2 positive, patchy strong  staining for p16.  The rectal tumor has a distinct immunohistochemical profile suggesting a primary colorectal tumor as opposed to metastatic cervical cancer. CT abdomen/pelvis 01/18/2021-bilateral hydronephrosis, endometrial fluid collection, prominent stool in the colon MRI pelvis 01/23/2021-tumor at 7.6 cm from the anal verge, abnormal signal bridge in the cervix, mesorectum, and anterior aspect of the rectum-similar to MRI from 2020, MRI stage could not be defined secondary to posttreatment changes in the pelvis 03/18/2021-total abdominal hysterectomy, cystoscopy with insertion of ureteral stents, exploratory laparotomy, aborted low anterior resection, descending loop colostomy--right and left fallopian tubes and ovaries negative for malignancy; excision portion of cervix positive for adenocarcinoma; uterus with invasive moderately differentiated adenocarcinoma of the cervix, HPV associated, invading through full-thickness of the cervix/lower uterine segment and into parametrial tissue, margins of resection received disrupted, cannot be evaluated; remaining uterus with extensive endocervical adenocarcinoma in situ involving lower uterine segment and replacing endometrium.  This carcinoma was felt to be a separate primary from the rectal tumor. PET scan at [facility] 04/11/2021-intense FDG activity in region of known rectal mass.  Hypermetabolic left external iliac and right inguinal lymph nodes.  Minimal FDG uptake and small right supraclavicular lymph node.  No uptake seen in the cervix. 04/23/2021 FNA right inguinal lymph node-negative for malignancy Evaluated by Dr. Lattie Corns 05/21/2021, 05/28/2021-appears to have 2 synchronous malignancies (cervical adenocarcinoma and rectal adenocarcinoma); further surgery which would entail pelvic exenteration not recommended by gynecologic oncology or colorectal  surgery.  Full doses of radiotherapy not felt to be feasible.  Recommended 3 months of CAPOX and then repeat MRI of  abdomen/pelvis, chest CT and colonoscopy. Cycle 1 Xeloda 06/13/2021 Cycle 1 FOLFOX 07/08/2021, oxaliplatin dose reduced secondary to pre-existing neuropathy Cycle 2 FOLFOX 07/29/2021, Udenyca Cycle 3 FOLFOX 08/12/2021, Udenyca Cycle 4 FOLFOX 09/02/2021, Udenyca Cycle 5 FOLFOX 09/17/2021, Udenyca CTs at [facility] 10/01/2021-unchanged findings of rectal malignancy status posttreatment.  Marked thickening of the proximal duodenal wall.  Unchanged bilateral inguinal lymph nodes which are not enlarged but avid on prior PET.  Unchanged diffuse bladder wall thickening and perivesicular stranding which may represent radiation cystitis.  New mild right lower lobe bronchial wall thickening, mucus impaction and tree-in-bud pulmonary nodules. Cycle 6 FOLFOX 10/22/2021, Udenyca CT 01/21/2022-stable soft tissue thickening at the lower rectum, slight increase in adjacent stranding no evidence of metastatic disease in the chest, abdomen, or pelvis, interval diverting loop colostomy CT pelvis and lower extremities 04/22/2023-subcutaneous fat stranding in the lower legs bilaterally on the right greater than left, enlarged right inguinal lymph nodes, total left hip arthroplasty with stable acetabular bone erosions, ill-defined soft tissue density in the region of the distal rectum with surrounding fat stranding unchanged     Bilateral hydronephrosis-secondary to urinary retention? Hematometra found on pelvic MRI 01/23/2021, evaluated by gynecologic oncology Cervical cancer November 1994, stage Ib, treated with external beam radiation and brachytherapy at [facility] Diabetes Neuropathy Chronic pain secondary to #6 Recurrent urinary tract infections History of a CVA History of hyperthyroidism G3, P3 COVID-19 infection January 2021 Admission 08/25/2021 with a urinary tract infection-culture negative Anemia secondary to chronic disease, chemotherapy, and rectal bleeding-1 unit packed red blood cells 08/26/2021 Hospital admission  09/23/2021-symptomatic anemia 16.  Admission 10/08/2021 with altered mental status, nausea/vomiting, and left foot ulcers Urine culture 10/08/2021 positive for Pseudomonas aeruginosa EGD 10/12/2021-gastric and duodenal ulcers-not bleeding 17.  Admission 11/29/2021 through 12/02/2021 with COVID-pneumonia 18.  Right lower extremity cellulitis 04/22/2023-Bactrim, repeat course of Bactrim 05/07/2023      Disposition: [name] appears stable.  There is no clinical evidence for progression of the rectal cancer.  She will return for an office visit on 07/24/2023  Thornton Papas, MD  06/05/2023  11:05 AM

## 2023-06-08 ENCOUNTER — Other Ambulatory Visit: Payer: Self-pay | Admitting: Oncology

## 2023-06-10 ENCOUNTER — Ambulatory Visit (INDEPENDENT_AMBULATORY_CARE_PROVIDER_SITE_OTHER): Payer: Medicare HMO | Admitting: Podiatry

## 2023-06-10 DIAGNOSIS — M79674 Pain in right toe(s): Secondary | ICD-10-CM

## 2023-06-10 DIAGNOSIS — M79675 Pain in left toe(s): Secondary | ICD-10-CM | POA: Diagnosis not present

## 2023-06-10 DIAGNOSIS — I739 Peripheral vascular disease, unspecified: Secondary | ICD-10-CM

## 2023-06-10 DIAGNOSIS — B351 Tinea unguium: Secondary | ICD-10-CM | POA: Diagnosis not present

## 2023-06-10 DIAGNOSIS — E119 Type 2 diabetes mellitus without complications: Secondary | ICD-10-CM

## 2023-06-10 DIAGNOSIS — E1142 Type 2 diabetes mellitus with diabetic polyneuropathy: Secondary | ICD-10-CM | POA: Diagnosis not present

## 2023-06-10 DIAGNOSIS — L84 Corns and callosities: Secondary | ICD-10-CM | POA: Diagnosis not present

## 2023-06-10 DIAGNOSIS — L89622 Pressure ulcer of left heel, stage 2: Secondary | ICD-10-CM | POA: Diagnosis not present

## 2023-06-18 ENCOUNTER — Encounter: Payer: Self-pay | Admitting: Podiatry

## 2023-06-18 NOTE — Progress Notes (Signed)
Subjective:  Patient ID: [name], female    DOB: 1946/02/20,  MRN: [id number]  [name] presents to clinic today for at risk footcare. Patient has h/o diabetes, neuropathy and PAD and is seen for  and preulcerative lesion(s) right foot and painful mycotic toenails that limit ambulation. Painful toenails interfere with ambulation. Aggravating factors include wearing enclosed shoe gear. Pain is relieved with periodic professional debridement. Painful preulcerative lesion(s) is/are aggravated when weightbearing with and without shoegear. Pain is relieved with periodic professional debridement.  Chief Complaint  Patient presents with   Diabetes    DFC BS - DIDN'T HECK IT TODAY A1C - [facility] - 02/2023   Callouses    CALLUS BILAT DAUGHTER WANTS LEFT HEEL CHECKED IT IS STARTING TO PEEL AND SHE IS WORRIED THAT IT IS THE START OF AN ULCER.   New problem(s):  Daughter, [name], is concerned about patient c/o painful left heel. Pain has been present for the last few weeks. Denies any drainage or odor. No fever, chills, night sweats, nausea or vomiting.  PCP is Elie Confer, NP.  Allergies  Allergen Reactions   Codeine Nausea Only    Review of Systems: Negative except as noted in the HPI.  Objective: There were no vitals filed for this visit. [name] is a pleasant 77 y.o. female in NAD. AAO x 3.  Vascular:  Capillary fill time to digits <3 seconds b/l lower extremities. Palpable DP pulse(s) b/l lower extremities Palpable PT pulse(s) b/l lower extremities Pedal hair absent. Lower extremity skin temperature gradient within normal limits. No pain with calf compression b/l. Trace edema noted right lower extremity with no open wounds. No warmth, no frank cellulitis.  Dermatological:           Left heel erythematous with tenderness to palpation. Mild fissuring.  Skin warm and supple b/l lower extremities. No open wounds b/l lower extremities. No interdigital  macerations b/l lower extremities. Toenails 1-5 b/l elongated, discolored, dystrophic, thickened, crumbly with subungual debris and tenderness to dorsal palpation.   Preulcerative lesion(s)  sub IPJ R hallux.  No erythema, no edema, no drainage, no fluctuance. She has minimal hyperkeratosis sub hallux IPJ of the left foot.  Musculoskeletal:  Normal muscle strength 5/5 to all lower extremity muscle groups bilaterally. No pain crepitus or joint limitation noted with ROM b/l lower extremities.   Hammertoe(s) noted to the L 2nd toe and R 2nd toe.   Neurological:  Protective sensation diminished with 10g monofilament b/l. Vibratory sensation decreased b/l. Assessment/Plan: 1. Pain due to onychomycosis of toenails of both feet   2. Pressure ulcer of left heel, stage 2 (HCC)   3. Pre-ulcerative calluses   4. PAD (peripheral artery disease) (HCC)   5. Diabetic peripheral neuropathy associated with type 2 diabetes mellitus (HCC)   6. Encounter for diabetic foot exam [facility])     -Patient's family member present. All questions/concerns addressed on today's visit. -Discussed pressure injury of left heel. Recommended heel protector(s) from [employer]. Apply to bilateral heels when in bed or when feet are exposed to pressure. Do not walk with heel protectors on, as it poses a fall risk. -Diabetic foot examination performed today. -Continue diabetic foot care principles: inspect feet daily, monitor glucose as recommended by PCP and/or Endocrinologist, and follow prescribed diet per PCP, Endocrinologist and/or dietician. -Toenails 1-5 b/l were debrided in length and girth with sterile nail nippers and dremel without iatrogenic bleeding.  -Preulcerative lesion pared right great toe utilizing sterile  scalpel blade. Total number pared=1. -Patient to apply Neosporin to left heel once daily. -Patient/POA to call should there be question/concern in the interim.   Return in about 3 months (around  09/10/2023).  [name], DPM

## 2023-07-24 ENCOUNTER — Inpatient Hospital Stay: Payer: Medicare HMO

## 2023-07-24 ENCOUNTER — Inpatient Hospital Stay: Payer: Medicare HMO | Attending: Nurse Practitioner

## 2023-07-24 ENCOUNTER — Inpatient Hospital Stay: Payer: Medicare HMO | Admitting: Oncology

## 2023-07-24 DIAGNOSIS — Z85048 Personal history of other malignant neoplasm of rectum, rectosigmoid junction, and anus: Secondary | ICD-10-CM | POA: Insufficient documentation

## 2023-07-24 DIAGNOSIS — R21 Rash and other nonspecific skin eruption: Secondary | ICD-10-CM | POA: Diagnosis not present

## 2023-07-24 DIAGNOSIS — Z08 Encounter for follow-up examination after completed treatment for malignant neoplasm: Secondary | ICD-10-CM | POA: Diagnosis present

## 2023-07-24 DIAGNOSIS — C20 Malignant neoplasm of rectum: Secondary | ICD-10-CM

## 2023-07-24 DIAGNOSIS — Z23 Encounter for immunization: Secondary | ICD-10-CM

## 2023-07-24 LAB — CMP (CANCER CENTER ONLY)
ALT: 6 U/L (ref 0–44)
AST: 17 U/L (ref 15–41)
Albumin: 3.8 g/dL (ref 3.5–5.0)
Alkaline Phosphatase: 145 U/L — ABNORMAL HIGH (ref 38–126)
Anion gap: 9 (ref 5–15)
BUN: 16 mg/dL (ref 8–23)
CO2: 27 mmol/L (ref 22–32)
Calcium: 8.7 mg/dL — ABNORMAL LOW (ref 8.9–10.3)
Chloride: 106 mmol/L (ref 98–111)
Creatinine: 0.79 mg/dL (ref 0.44–1.00)
GFR, Estimated: >60 mL/min (ref >60)
Glucose, Bld: 117 mg/dL — ABNORMAL HIGH (ref 70–99)
Potassium: 4.4 mmol/L (ref 3.5–5.1)
Sodium: 142 mmol/L (ref 135–145)
Total Bilirubin: 0.4 mg/dL (ref 0.3–1.2)
Total Protein: 6.7 g/dL (ref 6.5–8.1)

## 2023-07-24 LAB — CBC WITH DIFFERENTIAL (CANCER CENTER ONLY)
Abs Immature Granulocytes: 0.01 10*3/uL (ref 0.00–0.07)
Basophils Absolute: 0 10*3/uL (ref 0.0–0.1)
Basophils Relative: 0 %
Eosinophils Absolute: 0.1 10*3/uL (ref 0.0–0.5)
Eosinophils Relative: 2 %
HCT: 32.4 % — ABNORMAL LOW (ref 36.0–46.0)
Hemoglobin: 9.6 g/dL — ABNORMAL LOW (ref 12.0–15.0)
Immature Granulocytes: 0 %
Lymphocytes Relative: 19 %
Lymphs Abs: 1 10*3/uL (ref 0.7–4.0)
MCH: 26.9 pg (ref 26.0–34.0)
MCHC: 29.6 g/dL — ABNORMAL LOW (ref 30.0–36.0)
MCV: 90.8 fL (ref 80.0–100.0)
Monocytes Absolute: 0.4 10*3/uL (ref 0.1–1.0)
Monocytes Relative: 7 %
Neutro Abs: 3.7 10*3/uL (ref 1.7–7.7)
Neutrophils Relative %: 72 %
Platelet Count: 176 10*3/uL (ref 150–400)
RBC: 3.57 MIL/uL — ABNORMAL LOW (ref 3.87–5.11)
RDW: 14.8 % (ref 11.5–15.5)
WBC Count: 5.2 10*3/uL (ref 4.0–10.5)
nRBC: 0 % (ref 0.0–0.2)

## 2023-07-24 MED ORDER — INFLUENZA VAC A&B SURF ANT ADJ 0.5 ML IM SUSY
0.5000 mL | PREFILLED_SYRINGE | Freq: Once | INTRAMUSCULAR | Status: AC
  Administered 2023-07-24: 0.5 mL via INTRAMUSCULAR
  Filled 2023-07-24: qty 0.5

## 2023-07-24 MED ORDER — NYSTATIN 100000 UNIT/GM EX POWD
1.0000 | Freq: Two times a day (BID) | CUTANEOUS | 1 refills | Status: DC
Start: 2023-07-24 — End: 2024-01-07

## 2023-07-24 NOTE — Progress Notes (Signed)
[facility] OFFICE PROGRESS NOTE   Diagnosis: Rectal cancer  INTERVAL HISTORY:   [name] returns as scheduled.  Good appetite.  She is completing another course of Keflex for erythema at the lower legs.  She has been referred to the wound clinic for management of a pressure ulcer at the left heel and calluses at the plantar surface of the right foot.  Objective:  Vital signs in last 24 hours:  Blood pressure (!) 142/70, pulse (!) 56, temperature 97.9 F (36.6 C), temperature source Oral, resp. rate 18, height 5' (1.524 m), weight 155 lb 1.6 oz (70.4 kg), SpO2 100%.     Lymphatics: No cervical, supraclavicular, axillary, or inguinal nodes Resp: Lungs clear bilaterally Cardio: Regular rate and rhythm GI: No hepatosplenomegaly, left lower quadrant colostomy, no mass Vascular: Edema throughout the right greater than left leg  Skin: Erythema at the lower leg bilaterally with superficial ulcerations at the pretibial areas  Portacath/PICC-without erythema  Lab Results:  Lab Results  Component Value Date   WBC 5.2 07/24/2023   HGB 9.6 (L) 07/24/2023   HCT 32.4 (L) 07/24/2023   MCV 90.8 07/24/2023   PLT 176 07/24/2023   NEUTROABS 3.7 07/24/2023    CMP  Lab Results  Component Value Date   NA 142 07/24/2023   K 4.4 07/24/2023   CL 106 07/24/2023   CO2 27 07/24/2023   GLUCOSE 117 (H) 07/24/2023   BUN 16 07/24/2023   CREATININE 0.79 07/24/2023   CALCIUM 8.7 (L) 07/24/2023   PROT 6.7 07/24/2023   ALBUMIN 3.8 07/24/2023   AST 17 07/24/2023   ALT 6 07/24/2023   ALKPHOS 145 (H) 07/24/2023   BILITOT 0.4 07/24/2023   GFRNONAA >60 07/24/2023   GFRAA >60 03/29/2020    Lab Results  Component Value Date   CEA1 28.60 (H) 06/12/2021   CEA 44.01 (H) 02/02/2023     Medications: I have reviewed the patient's current medications.   Assessment/Plan: Rectal cancer Partially obstructing mass beginning at 10 cm from the anal verge on colonoscopy 01/23/2021,  biopsy-adenocarcinoma, immunohistochemical stains at [facility]CK20, CDX-2, and SATB2 positive, patchy strong staining for p16.  The rectal tumor has a distinct immunohistochemical profile suggesting a primary colorectal tumor as opposed to metastatic cervical cancer. CT abdomen/pelvis 01/18/2021-bilateral hydronephrosis, endometrial fluid collection, prominent stool in the colon MRI pelvis 01/23/2021-tumor at 7.6 cm from the anal verge, abnormal signal bridge in the cervix, mesorectum, and anterior aspect of the rectum-similar to MRI from 2020, MRI stage could not be defined secondary to posttreatment changes in the pelvis 03/18/2021-total abdominal hysterectomy, cystoscopy with insertion of ureteral stents, exploratory laparotomy, aborted low anterior resection, descending loop colostomy--right and left fallopian tubes and ovaries negative for malignancy; excision portion of cervix positive for adenocarcinoma; uterus with invasive moderately differentiated adenocarcinoma of the cervix, HPV associated, invading through full-thickness of the cervix/lower uterine segment and into parametrial tissue, margins of resection received disrupted, cannot be evaluated; remaining uterus with extensive endocervical adenocarcinoma in situ involving lower uterine segment and replacing endometrium.  This carcinoma was felt to be a separate primary from the rectal tumor. PET scan at [facility] 04/11/2021-intense FDG activity in region of known rectal mass.  Hypermetabolic left external iliac and right inguinal lymph nodes.  Minimal FDG uptake and small right supraclavicular lymph node.  No uptake seen in the cervix. 04/23/2021 FNA right inguinal lymph node-negative for malignancy Evaluated by Dr. Lattie Corns 05/21/2021, 05/28/2021-appears to have 2 synchronous malignancies (cervical adenocarcinoma and rectal adenocarcinoma); further  surgery which would entail pelvic exenteration not recommended by gynecologic oncology or colorectal surgery.  Full doses  of radiotherapy not felt to be feasible.  Recommended 3 months of CAPOX and then repeat MRI of abdomen/pelvis, chest CT and colonoscopy. Cycle 1 Xeloda 06/13/2021 Cycle 1 FOLFOX 07/08/2021, oxaliplatin dose reduced secondary to pre-existing neuropathy Cycle 2 FOLFOX 07/29/2021, Udenyca Cycle 3 FOLFOX 08/12/2021, Udenyca Cycle 4 FOLFOX 09/02/2021, Udenyca Cycle 5 FOLFOX 09/17/2021, Udenyca CTs at [facility] 10/01/2021-unchanged findings of rectal malignancy status posttreatment.  Marked thickening of the proximal duodenal wall.  Unchanged bilateral inguinal lymph nodes which are not enlarged but avid on prior PET.  Unchanged diffuse bladder wall thickening and perivesicular stranding which may represent radiation cystitis.  New mild right lower lobe bronchial wall thickening, mucus impaction and tree-in-bud pulmonary nodules. Cycle 6 FOLFOX 10/22/2021, Udenyca CT 01/21/2022-stable soft tissue thickening at the lower rectum, slight increase in adjacent stranding no evidence of metastatic disease in the chest, abdomen, or pelvis, interval diverting loop colostomy CT pelvis and lower extremities 04/22/2023-subcutaneous fat stranding in the lower legs bilaterally on the right greater than left, enlarged right inguinal lymph nodes, total left hip arthroplasty with stable acetabular bone erosions, ill-defined soft tissue density in the region of the distal rectum with surrounding fat stranding unchanged     Bilateral hydronephrosis-secondary to urinary retention? Hematometra found on pelvic MRI 01/23/2021, evaluated by gynecologic oncology Cervical cancer November 1994, stage Ib, treated with external beam radiation and brachytherapy at [facility] Diabetes Neuropathy Chronic pain secondary to #6 Recurrent urinary tract infections History of a CVA History of hyperthyroidism G3, P3 COVID-19 infection January 2021 Admission 08/25/2021 with a urinary tract infection-culture negative Anemia secondary to chronic disease,  chemotherapy, and rectal bleeding-1 unit packed red blood cells 08/26/2021 Hospital admission 09/23/2021-symptomatic anemia 16.  Admission 10/08/2021 with altered mental status, nausea/vomiting, and left foot ulcers Urine culture 10/08/2021 positive for Pseudomonas aeruginosa EGD 10/12/2021-gastric and duodenal ulcers-not bleeding 17.  Admission 11/29/2021 through 12/02/2021 with COVID-pneumonia 18.  Right lower extremity cellulitis 04/22/2023-Bactrim, repeat course of Bactrim 05/07/2023       Disposition: [name] has a history of rectal cancer.  There is no clinical evidence for progression of the cancer.  She reconfirmed her decision against further treatment.  A Port-A-Cath remains in place.  She will return for an office visit and Port-A-Cath flush in 6 weeks.  She has chronic edema of the legs.  The right leg remains more swollen than the left side.  A Doppler 04/22/2023 revealed no evidence of DVT.  I suspect the leg edema is in part related to the history of pelvic radiation.  She could have subclinical progression of tumor in the pelvis causing venous/lymphatic obstruction.  She will continue follow-up at the wound clinic for evaluation of the stasis changes at the legs and foot ulcer.  She may benefit from lymphedema therapy.    Thornton Papas, MD  07/24/2023  12:06 PM

## 2023-08-05 ENCOUNTER — Encounter: Payer: Medicare HMO | Attending: Internal Medicine | Admitting: Internal Medicine

## 2023-08-05 DIAGNOSIS — Z86718 Personal history of other venous thrombosis and embolism: Secondary | ICD-10-CM | POA: Insufficient documentation

## 2023-08-05 DIAGNOSIS — E1151 Type 2 diabetes mellitus with diabetic peripheral angiopathy without gangrene: Secondary | ICD-10-CM | POA: Diagnosis not present

## 2023-08-05 DIAGNOSIS — Z8541 Personal history of malignant neoplasm of cervix uteri: Secondary | ICD-10-CM | POA: Insufficient documentation

## 2023-08-05 DIAGNOSIS — E1142 Type 2 diabetes mellitus with diabetic polyneuropathy: Secondary | ICD-10-CM | POA: Diagnosis not present

## 2023-08-05 DIAGNOSIS — I10 Essential (primary) hypertension: Secondary | ICD-10-CM | POA: Insufficient documentation

## 2023-08-05 DIAGNOSIS — I871 Compression of vein: Secondary | ICD-10-CM | POA: Diagnosis not present

## 2023-08-05 DIAGNOSIS — E11622 Type 2 diabetes mellitus with other skin ulcer: Secondary | ICD-10-CM | POA: Diagnosis not present

## 2023-08-05 DIAGNOSIS — L97821 Non-pressure chronic ulcer of other part of left lower leg limited to breakdown of skin: Secondary | ICD-10-CM | POA: Insufficient documentation

## 2023-08-05 DIAGNOSIS — J449 Chronic obstructive pulmonary disease, unspecified: Secondary | ICD-10-CM | POA: Diagnosis not present

## 2023-08-05 DIAGNOSIS — I87323 Chronic venous hypertension (idiopathic) with inflammation of bilateral lower extremity: Secondary | ICD-10-CM | POA: Insufficient documentation

## 2023-08-05 DIAGNOSIS — L97818 Non-pressure chronic ulcer of other part of right lower leg with other specified severity: Secondary | ICD-10-CM | POA: Diagnosis not present

## 2023-08-05 DIAGNOSIS — Z85048 Personal history of other malignant neoplasm of rectum, rectosigmoid junction, and anus: Secondary | ICD-10-CM | POA: Diagnosis not present

## 2023-08-05 DIAGNOSIS — Z933 Colostomy status: Secondary | ICD-10-CM | POA: Insufficient documentation

## 2023-08-06 NOTE — Progress Notes (Signed)
Account Number: [id number] Date of Birth/Sex: Treating RN: 09/29/1946 (77 y.o. Ginette Pitman Primary Care Jahni Nazar: Maryelizabeth Rowan Other Clinician: Referring Karyna Bessler: Treating Kahmari Herard/Extender: RO BSO N, [name] Richardean Sale, Marlou Sa in Treatment: 0 Encounter Discharge Information Items Discharge Condition: Stable Ambulatory Status: Walker Discharge Destination: Home Transportation: Private Auto Accompanied By: daughter Schedule Follow-up Appointment: Yes Clinical Summary of Care: Electronic Signature(s) Signed: 08/05/2023 3:10:10 PM By: Midge Aver MSN RN CNS WTA Previous Signature: 08/05/2023 3:09:27 PM Version By: Midge Aver MSN RN CNS WTA Entered By: Midge Aver on 08/05/2023 12:10:10 -------------------------------------------------------------------------------- Lower Extremity Assessment Details Patient Name: Date of Service: Alvino Chapel NES, BEA TRICE F. 08/05/2023 10:30 A M Medical Record Number: [id number] Patient Account Number: [id number] Date of Birth/Sex: Treating RN: [birth year] (77 y.o. Ginette Pitman Primary Care Kaiyla Stahly: Maryelizabeth Rowan Other Clinician: Referring Lidia Clavijo: Treating Akhila Mahnken/Extender: RO BSO N, [name] Richardean Sale, Marlou Sa in Treatment: 0 Edema  Assessment Assessed: [Left: No] [Right: No] [Left: Edema] [Right: :] Calf Left: Right: Point of Measurement: 35 cm From Medial Instep 35 cm 38.2 cm Ankle Left: Right: Point of Measurement: 11 cm From Medial Instep 19.5 cm 20.5 cm Knee To Floor Left: Right: From Medial Instep 38 cm 38 cm NOVAH, GREGSTON ([id number]) [id number].pdf Page 6 of 16 Vascular Assessment Pulses: Dorsalis Pedis Palpable: [Left:Yes] [Right:Yes] Doppler Audible: [Left:Yes] [Right:Yes] Extremity colors, hair growth, and conditions: Extremity Color: [Left:Red] [Right:Red] Hair Growth on Extremity: [Left:No] [Right:No] Temperature of Extremity: [Left:Warm] [Right:Warm] Capillary Refill: [Left:> 3 seconds] [Right:> 3 seconds] Dependent Rubor: [Left:No] [Right:No] Blanched when Elevated: [Left:No] [Right:No] Lipodermatosclerosis: [Left:No] [Right:No] Blood Pressure: Brachial: [Left:160] [Right:160] Ankle: [Left:Dorsalis Pedis: 120 0.75] [Right:Dorsalis Pedis: 110 0.69] Toe Nail Assessment Left: Right: Thick: No No Discolored: No No Deformed: No No Improper Length and Hygiene: No No Electronic Signature(s) Signed: 08/05/2023 5:31:20 PM By: Midge Aver MSN RN CNS WTA Entered By: Midge Aver on 08/05/2023 08:25:58 -------------------------------------------------------------------------------- Multi Wound Chart Details Patient Name: Date of Service: JO NES, BEA TRICE F. 08/05/2023 10:30 A M Medical Record Number: [id number] Patient Account Number: [id number] Date of Birth/Sex: Treating RN: [birth year] (77 y.o. Ginette Pitman Primary Care Defne Gerling: Maryelizabeth Rowan Other Clinician: Referring Samanta Gal: Treating Valbona Slabach/Extender: RO BSO N, [name] G Millsaps, Marlou Sa in Treatment: 0 Vital Signs Height(in): 60 Pulse(bpm): 47 Weight(lbs): 155 Blood Pressure(mmHg): 160/55 Body Mass Index(BMI): 30.3 Temperature(F): 97.8 Respiratory Rate(breaths/min):  18 [1:Photos:] Right, Posterior T Great oe Right, Medial Lower Leg Left, Posterior Lower Leg Wound Location: Gradually Appeared Gradually Appeared Gradually Appeared Wounding Event: AKEELAH, EINBINDER ([id number]) [phone].pdf Page 7 of 16 Diabetic Wound/Ulcer of the Lower Diabetic Wound/Ulcer of the Lower Diabetic Wound/Ulcer of the Lower Primary Etiology: Extremity Extremity Extremity Cataracts, Anemia, Chronic Cataracts, Anemia, Chronic Cataracts, Anemia, Chronic Comorbid History: Obstructive Pulmonary Disease Obstructive Pulmonary Disease Obstructive Pulmonary Disease (COPD), Peripheral Arterial Disease, (COPD), Peripheral Arterial Disease, (COPD), Peripheral Arterial Disease, Type II Diabetes Type II Diabetes Type II Diabetes 08/05/2023 08/05/2023 08/05/2023 Date Acquired: 0 0 0 Weeks of Treatment: Open Open Open Wound Status: No No No Wound Recurrence: 0.2x0.2x0.1 0.5x0.5x0.1 0.7x0.7x0.1 Measurements L x W x D (cm) 0.031 0.196 0.385 A (cm) : rea 0.003 0.02 0.038 Volume (cm) : Grade 1 Grade 2 Grade 1 Classification: None Present Medium Medium Exudate A mount: N/A Serosanguineous Serosanguineous Exudate Type: N/A red, brown red, brown Exudate Color: None Present (0%) Medium (34-66%) Medium (34-66%) Granulation A mount: N/A Red, Pink Pale Granulation Quality: None Present (0%) None Present (0%) None Present (0%) Necrotic  Type II Diabetes Clustered Wound: No Photos Wound Measurements Length: (cm) 2.2 Width: (cm) 0.3 Depth: (cm) 0.1 Area: (cm) 0.5 Volume: (cm) 0.0 SERIN, YANKE ([id number]) Wound Description Classification: Grade 2 Exudate Amount: Medium Exudate Type: Serosanguineous Exudate Color: red, brown Foul Odor After Cleansing: Slough/Fibrino % Reduction in Area: % Reduction in Volume: Epithelialization: Medium (34-66%) 18 Tunneling: No 52 Undermining: No [id number].pdf Page 15 of 16 No No Wound  Bed Granulation Amount: Medium (34-66%) Exposed Structure Granulation Quality: Red, Pink Fascia Exposed: No Necrotic Amount: None Present (0%) Fat Layer (Subcutaneous Tissue) Exposed: No Tendon Exposed: No Muscle Exposed: No Joint Exposed: No Bone Exposed: No Treatment Notes Wound #4 (Lower Leg) Wound Laterality: Left, Lateral Cleanser Soap and Water Discharge Instruction: Gently cleanse wound with antibacterial soap, rinse and pat dry prior to dressing wounds Peri-Wound Care Topical Primary Dressing Hydrofera Blue Ready Transfer Foam, 2.5x2.5 (in/in) Discharge Instruction: Apply Hydrofera Blue Ready to wound bed as directed Secondary Dressing ABD Pad 5x9 (in/in) Discharge Instruction: Cover with ABD pad Secured With Compression Wrap Urgo K2 Lite, two layer compression system, regular Compression Stockings Add-Ons Electronic Signature(s) Signed: 08/05/2023 5:31:20 PM By: Midge Aver MSN RN CNS WTA Entered By: Midge Aver on 08/05/2023 08:25:43 -------------------------------------------------------------------------------- Vitals Details Patient Name: Date of Service: JO NES, BEA TRICE F. 08/05/2023 10:30 A M Medical Record Number: [id number] Patient Account Number: [id number] Date of Birth/Sex: Treating RN: May 29, 1946 (77 y.o. Ginette Pitman Primary Care Junell Cullifer: Maryelizabeth Rowan Other Clinician: Referring Terrica Duecker: Treating Anni Hocevar/Extender: RO BSO N, [name] G Millsaps, Marlou Sa in Treatment: 0 Vital Signs Time Taken: 10:46 Temperature (F): 97.8 Height (in): 60 Pulse (bpm): 47 Source: Stated Respiratory Rate (breaths/min): [address] F ([id number]) [id number] Page 16 of 16 Weight (lbs): 155 Blood Pressure (mmHg): 160/55 Source: Stated Reference Range: 80 - 120 mg / dl Body Mass Index (BMI): 30.3 Electronic Signature(s) Signed: 08/05/2023 5:31:20 PM By: Midge Aver MSN RN CNS WTA Entered By: Midge Aver on  08/05/2023 07:47:58  Account Number: [id number] Date of Birth/Sex: Treating RN: 09/29/1946 (77 y.o. Ginette Pitman Primary Care Jahni Nazar: Maryelizabeth Rowan Other Clinician: Referring Karyna Bessler: Treating Kahmari Herard/Extender: RO BSO N, [name] Richardean Sale, Marlou Sa in Treatment: 0 Encounter Discharge Information Items Discharge Condition: Stable Ambulatory Status: Walker Discharge Destination: Home Transportation: Private Auto Accompanied By: daughter Schedule Follow-up Appointment: Yes Clinical Summary of Care: Electronic Signature(s) Signed: 08/05/2023 3:10:10 PM By: Midge Aver MSN RN CNS WTA Previous Signature: 08/05/2023 3:09:27 PM Version By: Midge Aver MSN RN CNS WTA Entered By: Midge Aver on 08/05/2023 12:10:10 -------------------------------------------------------------------------------- Lower Extremity Assessment Details Patient Name: Date of Service: Alvino Chapel NES, BEA TRICE F. 08/05/2023 10:30 A M Medical Record Number: [id number] Patient Account Number: [id number] Date of Birth/Sex: Treating RN: [birth year] (77 y.o. Ginette Pitman Primary Care Kaiyla Stahly: Maryelizabeth Rowan Other Clinician: Referring Lidia Clavijo: Treating Akhila Mahnken/Extender: RO BSO N, [name] Richardean Sale, Marlou Sa in Treatment: 0 Edema  Assessment Assessed: [Left: No] [Right: No] [Left: Edema] [Right: :] Calf Left: Right: Point of Measurement: 35 cm From Medial Instep 35 cm 38.2 cm Ankle Left: Right: Point of Measurement: 11 cm From Medial Instep 19.5 cm 20.5 cm Knee To Floor Left: Right: From Medial Instep 38 cm 38 cm NOVAH, GREGSTON ([id number]) [id number].pdf Page 6 of 16 Vascular Assessment Pulses: Dorsalis Pedis Palpable: [Left:Yes] [Right:Yes] Doppler Audible: [Left:Yes] [Right:Yes] Extremity colors, hair growth, and conditions: Extremity Color: [Left:Red] [Right:Red] Hair Growth on Extremity: [Left:No] [Right:No] Temperature of Extremity: [Left:Warm] [Right:Warm] Capillary Refill: [Left:> 3 seconds] [Right:> 3 seconds] Dependent Rubor: [Left:No] [Right:No] Blanched when Elevated: [Left:No] [Right:No] Lipodermatosclerosis: [Left:No] [Right:No] Blood Pressure: Brachial: [Left:160] [Right:160] Ankle: [Left:Dorsalis Pedis: 120 0.75] [Right:Dorsalis Pedis: 110 0.69] Toe Nail Assessment Left: Right: Thick: No No Discolored: No No Deformed: No No Improper Length and Hygiene: No No Electronic Signature(s) Signed: 08/05/2023 5:31:20 PM By: Midge Aver MSN RN CNS WTA Entered By: Midge Aver on 08/05/2023 08:25:58 -------------------------------------------------------------------------------- Multi Wound Chart Details Patient Name: Date of Service: JO NES, BEA TRICE F. 08/05/2023 10:30 A M Medical Record Number: [id number] Patient Account Number: [id number] Date of Birth/Sex: Treating RN: [birth year] (77 y.o. Ginette Pitman Primary Care Defne Gerling: Maryelizabeth Rowan Other Clinician: Referring Samanta Gal: Treating Valbona Slabach/Extender: RO BSO N, [name] G Millsaps, Marlou Sa in Treatment: 0 Vital Signs Height(in): 60 Pulse(bpm): 47 Weight(lbs): 155 Blood Pressure(mmHg): 160/55 Body Mass Index(BMI): 30.3 Temperature(F): 97.8 Respiratory Rate(breaths/min):  18 [1:Photos:] Right, Posterior T Great oe Right, Medial Lower Leg Left, Posterior Lower Leg Wound Location: Gradually Appeared Gradually Appeared Gradually Appeared Wounding Event: AKEELAH, EINBINDER ([id number]) [phone].pdf Page 7 of 16 Diabetic Wound/Ulcer of the Lower Diabetic Wound/Ulcer of the Lower Diabetic Wound/Ulcer of the Lower Primary Etiology: Extremity Extremity Extremity Cataracts, Anemia, Chronic Cataracts, Anemia, Chronic Cataracts, Anemia, Chronic Comorbid History: Obstructive Pulmonary Disease Obstructive Pulmonary Disease Obstructive Pulmonary Disease (COPD), Peripheral Arterial Disease, (COPD), Peripheral Arterial Disease, (COPD), Peripheral Arterial Disease, Type II Diabetes Type II Diabetes Type II Diabetes 08/05/2023 08/05/2023 08/05/2023 Date Acquired: 0 0 0 Weeks of Treatment: Open Open Open Wound Status: No No No Wound Recurrence: 0.2x0.2x0.1 0.5x0.5x0.1 0.7x0.7x0.1 Measurements L x W x D (cm) 0.031 0.196 0.385 A (cm) : rea 0.003 0.02 0.038 Volume (cm) : Grade 1 Grade 2 Grade 1 Classification: None Present Medium Medium Exudate A mount: N/A Serosanguineous Serosanguineous Exudate Type: N/A red, brown red, brown Exudate Color: None Present (0%) Medium (34-66%) Medium (34-66%) Granulation A mount: N/A Red, Pink Pale Granulation Quality: None Present (0%) None Present (0%) None Present (0%) Necrotic  Type II Diabetes Clustered Wound: No Photos Wound Measurements Length: (cm) 2.2 Width: (cm) 0.3 Depth: (cm) 0.1 Area: (cm) 0.5 Volume: (cm) 0.0 SERIN, YANKE ([id number]) Wound Description Classification: Grade 2 Exudate Amount: Medium Exudate Type: Serosanguineous Exudate Color: red, brown Foul Odor After Cleansing: Slough/Fibrino % Reduction in Area: % Reduction in Volume: Epithelialization: Medium (34-66%) 18 Tunneling: No 52 Undermining: No [id number].pdf Page 15 of 16 No No Wound  Bed Granulation Amount: Medium (34-66%) Exposed Structure Granulation Quality: Red, Pink Fascia Exposed: No Necrotic Amount: None Present (0%) Fat Layer (Subcutaneous Tissue) Exposed: No Tendon Exposed: No Muscle Exposed: No Joint Exposed: No Bone Exposed: No Treatment Notes Wound #4 (Lower Leg) Wound Laterality: Left, Lateral Cleanser Soap and Water Discharge Instruction: Gently cleanse wound with antibacterial soap, rinse and pat dry prior to dressing wounds Peri-Wound Care Topical Primary Dressing Hydrofera Blue Ready Transfer Foam, 2.5x2.5 (in/in) Discharge Instruction: Apply Hydrofera Blue Ready to wound bed as directed Secondary Dressing ABD Pad 5x9 (in/in) Discharge Instruction: Cover with ABD pad Secured With Compression Wrap Urgo K2 Lite, two layer compression system, regular Compression Stockings Add-Ons Electronic Signature(s) Signed: 08/05/2023 5:31:20 PM By: Midge Aver MSN RN CNS WTA Entered By: Midge Aver on 08/05/2023 08:25:43 -------------------------------------------------------------------------------- Vitals Details Patient Name: Date of Service: JO NES, BEA TRICE F. 08/05/2023 10:30 A M Medical Record Number: [id number] Patient Account Number: [id number] Date of Birth/Sex: Treating RN: May 29, 1946 (77 y.o. Ginette Pitman Primary Care Junell Cullifer: Maryelizabeth Rowan Other Clinician: Referring Terrica Duecker: Treating Anni Hocevar/Extender: RO BSO N, [name] G Millsaps, Marlou Sa in Treatment: 0 Vital Signs Time Taken: 10:46 Temperature (F): 97.8 Height (in): 60 Pulse (bpm): 47 Source: Stated Respiratory Rate (breaths/min): [address] F ([id number]) [id number] Page 16 of 16 Weight (lbs): 155 Blood Pressure (mmHg): 160/55 Source: Stated Reference Range: 80 - 120 mg / dl Body Mass Index (BMI): 30.3 Electronic Signature(s) Signed: 08/05/2023 5:31:20 PM By: Midge Aver MSN RN CNS WTA Entered By: Midge Aver on  08/05/2023 07:47:58  A mount: Fascia: No Fascia: No Fascia: No Exposed Structures: Fat Layer (Subcutaneous Tissue): No Fat Layer (Subcutaneous Tissue): No Fat Layer (Subcutaneous Tissue): No Tendon: No Tendon: No Tendon: No Muscle: No Muscle: No Muscle: No Joint: No Joint: No Joint: No Bone: No Bone: No Bone: No None Medium (34-66%) Small (1-33%) Epithelialization: N/A Compression Therapy Compression Therapy Procedures Performed: Wound Number: 4 N/A N/A Photos: N/A N/A Left, Lateral Lower Leg N/A N/A Wound Location: Gradually  Appeared N/A N/A Wounding Event: Diabetic Wound/Ulcer of the Lower N/A N/A Primary Etiology: Extremity Cataracts, Anemia, Chronic N/A N/A Comorbid History: Obstructive Pulmonary Disease (COPD), Peripheral Arterial Disease, Type II Diabetes 08/05/2023 N/A N/A Date Acquired: 0 N/A N/A Weeks of Treatment: Open N/A N/A Wound Status: No N/A N/A Wound Recurrence: 2.2x0.3x0.1 N/A N/A Measurements L x W x D (cm) 0.518 N/A N/A A (cm) : rea 0.052 N/A N/A Volume (cm) : Grade 2 N/A N/A Classification: Medium N/A N/A Exudate A mount: Serosanguineous N/A N/A Exudate Type: red, brown N/A N/A Exudate Color: Medium (34-66%) N/A N/A Granulation A mount: Red, Pink N/A N/A Granulation Quality: None Present (0%) N/A N/A Necrotic A mount: Fascia: No N/A N/A Exposed Structures: Fat Layer (Subcutaneous Tissue): No Tendon: No Muscle: No Joint: No Bone: No Medium (34-66%) N/A N/A Epithelialization: Compression Therapy N/A N/A Procedures Performed: Treatment Notes Electronic Signature(s) Signed: 08/05/2023 3:01:22 PM By: Midge Aver MSN RN CNS WTA Entered By: Midge Aver on 08/05/2023 12:01:22 [name] ([id number]) [id number].pdf Page 8 of 16 -------------------------------------------------------------------------------- Multi-Disciplinary Care Plan Details Patient Name: Date of Service: [facility] NES, Elayne Snare 08/05/2023 10:30 A M Medical Record Number: [id number] Patient Account Number: [id number] Date of Birth/Sex: Treating RN: 1946/04/16 (77 y.o. Ginette Pitman Primary Care Keisy Strickler: Maryelizabeth Rowan Other Clinician: Referring Aldena Worm: Treating Maizie Garno/Extender: RO BSO N, [name] G Millsaps, Marlou Sa in Treatment: 0 Active Inactive Orientation to the Wound Care Program Nursing Diagnoses: Knowledge deficit related to the wound healing center program Goals: Patient/caregiver will verbalize understanding of the Wound Healing Center  Program Date Initiated: 08/05/2023 Target Resolution Date: 08/12/2023 Goal Status: Active Interventions: Provide education on orientation to the wound center Notes: Wound/Skin Impairment Nursing Diagnoses: Impaired tissue integrity Knowledge deficit related to ulceration/compromised skin integrity Goals: Patient/caregiver will verbalize understanding of skin care regimen Date Initiated: 08/05/2023 Target Resolution Date: 09/04/2023 Goal Status: Active Ulcer/skin breakdown will have a volume reduction of 30% by week 4 Date Initiated: 08/05/2023 Target Resolution Date: 09/04/2023 Goal Status: Active Ulcer/skin breakdown will have a volume reduction of 50% by week 8 Date Initiated: 08/05/2023 Target Resolution Date: 10/05/2023 Goal Status: Active Ulcer/skin breakdown will have a volume reduction of 80% by week 12 Date Initiated: 08/05/2023 Target Resolution Date: 11/04/2023 Goal Status: Active Interventions: Assess patient/caregiver ability to obtain necessary supplies Assess patient/caregiver ability to perform ulcer/skin care regimen upon admission and as needed Assess ulceration(s) every visit Provide education on ulcer and skin care Treatment Activities: Skin care regimen initiated : 08/05/2023 Notes: Electronic Signature(s) Signed: 08/05/2023 3:07:52 PM By: Midge Aver MSN RN CNS WTA Previous Signature: 08/05/2023 3:07:43 PM Version By: Midge Aver MSN RN CNS WTA Entered By: Midge Aver on 08/05/2023 12:07:51 [name] ([id number]) [id number] Page 9 of 16 -------------------------------------------------------------------------------- Pain Assessment Details Patient Name: Date of Service: [facility] NES, Elayne Snare 08/05/2023 10:30 A M Medical Record Number: [id number] Patient Account Number: [id number] Date of Birth/Sex: Treating RN: 1946-09-19 (77 y.o. Ginette Pitman Primary Care Seydina Holliman: Maryelizabeth Rowan Other Clinician: Referring  BHARTI, SEGRIST F ([id number]) [id number] Page 1 of 16 Visit Report for 08/05/2023 Allergy List Details Patient Name: Date of Service: [facility] NES, Elayne Snare 08/05/2023 10:30 A M Medical Record Number: [id number] Patient Account Number: [id number] Date of Birth/Sex: Treating RN: [birth year] (77 y.o. Ginette Pitman Primary Care Isai Gottlieb: Maryelizabeth Rowan Other Clinician: Referring Emmalou Hunger: Treating Shenica Holzheimer/Extender: RO BSO N, [name] Richardean Sale, Marlou Sa in Treatment: 0 Allergies Active Allergies codeine Severity: Moderate Allergy Notes Electronic Signature(s) Signed: 08/05/2023 5:31:20 PM By: Midge Aver MSN RN CNS WTA Entered By: Midge Aver on 08/05/2023 07:48:18 -------------------------------------------------------------------------------- Arrival Information Details Patient Name: Date of Service: JO NES, Jodelle Green F. 08/05/2023 10:30 A M Medical Record Number: [id number] Patient Account Number: [id number] Date of Birth/Sex: Treating RN: 15-Aug-1946 (77 y.o. Ginette Pitman Primary Care Kaliann Coryell: Maryelizabeth Rowan Other Clinician: Referring Simora Dingee: Treating Chandler Swiderski/Extender: RO BSO Dorris Carnes, [name] Richardean Sale, Marlou Sa in Treatment: 0 Visit Information Patient Arrived: Walker Arrival Time: 10:40 Accompanied By: self Transfer Assistance: None Patient Identification Verified: Yes Secondary Verification Process Completed: Yes Patient Requires Transmission-Based Precautions: No Patient Has Alerts: Yes Patient Alerts: diabetic Plavix Electronic Signature(s) DASHAE, PERINI ([id number]) [phone].pdf Page 2 of 16 Signed: 08/05/2023 5:31:20 PM By: Midge Aver MSN RN CNS WTA Entered By: Midge Aver on 08/05/2023 07:46:13 -------------------------------------------------------------------------------- Clinic Level of Care Assessment Details Patient Name: Date of Service: [facility] NES, BEA TRICE F. 08/05/2023 10:30 A  M Medical Record Number: [id number] Patient Account Number: [id number] Date of Birth/Sex: Treating RN: 03/23/1946 (77 y.o. Ginette Pitman Primary Care Parneet Glantz: Maryelizabeth Rowan Other Clinician: Referring Lew Prout: Treating Briggette Najarian/Extender: RO BSO N, [name] G Millsaps, Marlou Sa in Treatment: 0 Clinic Level of Care Assessment Items TOOL 2 Quantity Score X- 1 0 Use when only an EandM is performed on the INITIAL visit ASSESSMENTS - Nursing Assessment / Reassessment X- 1 20 General Physical Exam (combine w/ comprehensive assessment (listed just below) when performed on new pt. evals) X- 1 25 Comprehensive Assessment (HX, ROS, Risk Assessments, Wounds Hx, etc.) ASSESSMENTS - Wound and Skin A ssessment / Reassessment []  - 0 Simple Wound Assessment / Reassessment - one wound X- 4 5 Complex Wound Assessment / Reassessment - multiple wounds []  - 0 Dermatologic / Skin Assessment (not related to wound area) ASSESSMENTS - Ostomy and/or Continence Assessment and Care []  - 0 Incontinence Assessment and Management []  - 0 Ostomy Care Assessment and Management (repouching, etc.) PROCESS - Coordination of Care []  - 0 Simple Patient / Family Education for ongoing care X- 1 20 Complex (extensive) Patient / Family Education for ongoing care X- 1 10 Staff obtains Chiropractor, Records, T Results / Process Orders est []  - 0 Staff telephones HHA, Nursing Homes / Clarify orders / etc []  - 0 Routine Transfer to another Facility (non-emergent condition) []  - 0 Routine Hospital Admission (non-emergent condition) X- 1 15 New Admissions / Manufacturing engineer / Ordering NPWT Apligraf, etc. , []  - 0 Emergency Hospital Admission (emergent condition) []  - 0 Simple Discharge Coordination X- 1 15 Complex (extensive) Discharge Coordination PROCESS - Special Needs []  - 0 Pediatric / Minor Patient Management []  - 0 Isolation Patient Management []  - 0 Hearing / Language / Visual special  needs []  - 0 Assessment of Community assistance (transportation, D/C planning, etc.) []  - 0 Additional assistance / Altered mentation SCOTLYN, GOTSHALL F ([id number]) [id number] Page 3 of 16 []  - 0 Support Surface(s) Assessment (bed, cushion, seat, etc.) INTERVENTIONS - Wound Cleansing / Measurement X- 1  Area: % Reduction in Volume: Epithelialization: Medium (34-66%) Tunneling: No Undermining: No Wound Description Classification: Grade 2 Exudate Amount: Medium Exudate Type: Serosanguineous Exudate Color: red, brown Foul Odor After Cleansing: No Slough/Fibrino No Wound Bed Granulation Amount: Medium (34-66%) Exposed Structure Granulation Quality: Red, Pink Fascia Exposed: No Necrotic Amount: None Present (0%) Fat Layer (Subcutaneous Tissue) Exposed: No Tendon Exposed: No Muscle Exposed: No Joint Exposed: No Bone Exposed: No Treatment Notes Wound #2 (Lower Leg) Wound Laterality: Right, Medial Cleanser Soap and Water Discharge Instruction: Gently cleanse wound with antibacterial soap, rinse and pat dry prior to dressing wounds Peri-Wound Care Topical Primary Dressing Hydrofera Blue Ready Transfer Foam, 2.5x2.5 (in/in) Discharge Instruction: Apply Hydrofera Blue Ready to wound bed as directed Secondary Dressing ABD Pad 5x9 (in/in) Discharge Instruction: Cover with ABD pad Secured With Compression Wrap Urgo K2 Lite, two layer compression system, regular Compression Stockings Add-Ons Electronic Signature(s) Signed: 08/05/2023 5:31:20 PM By: Midge Aver MSN RN  CNS WTA Entered By: Midge Aver on 08/05/2023 08:22:40 [name] ([id number]) [id number].pdf Page 13 of 16 -------------------------------------------------------------------------------- Wound Assessment Details Patient Name: Date of Service: [facility] NES, Elayne Snare 08/05/2023 10:30 A M Medical Record Number: [id number] Patient Account Number: [id number] Date of Birth/Sex: Treating RN: 22-Apr-1946 (77 y.o. Ginette Pitman Primary Care Estiven Kohan: Maryelizabeth Rowan Other Clinician: Referring Jeane Cashatt: Treating Laryn Venning/Extender: RO BSO N, [name] Richardean Sale, Marlou Sa in Treatment: 0 Wound Status Wound Number: 3 Primary Diabetic Wound/Ulcer of the Lower Extremity Etiology: Wound Location: Left, Posterior Lower Leg Wound Open Wounding Event: Gradually Appeared Status: Date Acquired: 08/05/2023 Comorbid Cataracts, Anemia, Chronic Obstructive Pulmonary Disease Weeks Of Treatment: 0 History: (COPD), Peripheral Arterial Disease, Type II Diabetes Clustered Wound: No Photos Wound Measurements Length: (cm) 0.7 Width: (cm) 0.7 Depth: (cm) 0.1 Area: (cm) 0.385 Volume: (cm) 0.038 % Reduction in Area: % Reduction in Volume: Epithelialization: Small (1-33%) Tunneling: No Undermining: No Wound Description Classification: Grade 1 Exudate Amount: Medium Exudate Type: Serosanguineous Exudate Color: red, brown Foul Odor After Cleansing: No Slough/Fibrino No Wound Bed Granulation Amount: Medium (34-66%) Exposed Structure Granulation Quality: Pale Fascia Exposed: No Necrotic Amount: None Present (0%) Fat Layer (Subcutaneous Tissue) Exposed: No Tendon Exposed: No Muscle Exposed: No Joint Exposed: No Bone Exposed: No Treatment Notes Wound #3 (Lower Leg) Wound Laterality: Left, Posterior Cleanser Soap and Water Discharge Instruction: Gently cleanse wound with antibacterial soap, rinse and pat dry prior to dressing wounds Peri-Wound Care JAMERRIA, DETURK ([id number]) [id number] Page 14 of 16 Topical Primary Dressing Hydrofera Blue Ready Transfer Foam, 2.5x2.5 (in/in) Discharge Instruction: Apply Hydrofera Blue Ready to wound bed as directed Secondary Dressing ABD Pad 5x9 (in/in) Discharge Instruction: Cover with ABD pad Secured With Compression Wrap Urgo K2 Lite, two layer compression system, regular Compression Stockings Add-Ons Electronic Signature(s) Signed: 08/05/2023 5:31:20 PM By: Midge Aver MSN RN CNS WTA Entered By: Midge Aver on 08/05/2023 08:24:23 -------------------------------------------------------------------------------- Wound Assessment Details Patient Name: Date of Service: JO NES, BEA TRICE F. 08/05/2023 10:30 A M Medical Record Number: [id number] Patient Account Number: [id number] Date of Birth/Sex: Treating RN: September 14, 1946 (77 y.o. Ginette Pitman Primary Care Kagan Mutchler: Maryelizabeth Rowan Other Clinician: Referring Tammera Engert: Treating Yotam Rhine/Extender: RO BSO N, [name] G Millsaps, Marlou Sa in Treatment: 0 Wound Status Wound Number: 4 Primary Diabetic Wound/Ulcer of the Lower Extremity Etiology: Wound Location: Left, Lateral Lower Leg Wound Open Wounding Event: Gradually Appeared Status: Date Acquired: 08/05/2023 Comorbid Cataracts, Anemia, Chronic Obstructive Pulmonary Disease Weeks Of Treatment: 0 History: (COPD), Peripheral Arterial Disease,

## 2023-08-06 NOTE — Progress Notes (Signed)
[name], [name] ([id number]) [id number] Page 1 of 5 Visit Report for 08/05/2023 Abuse Risk Screen Details Patient Name: Date of Service: [name], [name] 08/05/2023 10:30 A M Medical Record Number: [id number] Patient Account Number: [id number] Date of Birth/Sex: Treating RN: 1946/09/06 (77 y.o. [name] Primary Care [name]: [name] Other Clinician: Referring [name]: Treating [name]/Extender: [name] [name], [name]: 0 Abuse Risk Screen Items Answer Electronic Signature(s) Signed: 08/05/2023 5:31:20 PM By: [name] Aver MSN RN CNS WTA Entered By: [name] on 08/05/2023 07:56:33 -------------------------------------------------------------------------------- Activities of Daily Living Details Patient Name: Date of Service: [name], [name]. 08/05/2023 10:30 A M Medical Record Number: [id number] Patient Account Number: [id number] Date of Birth/Sex: Treating RN: Jul 06, 1946 (77 y.o. [name] Primary Care [name]: [name] Other Clinician: Referring [name]: Treating [name]/Extender: [name] G Millsaps, [name]: 0 Activities of Daily Living Items Answer Activities of Daily Living (Please select one for each item) Drive Automobile Not Able T Medications ake Need Assistance Use T elephone Completely Able Care for Appearance Need Assistance Use T oilet Need Assistance Bath / Shower Need Assistance Dress Self Need Assistance Feed Self Completely Able Walk Need Assistance Get In / Out Bed Need Assistance Housework Not Able Prepare Meals Need Assistance Handle Money Need Assistance Shop for Self Not [name] ([id number]) [phone] [id number].pdf Page 2 of 5 Electronic Signature(s) Signed: 08/05/2023 5:31:20 PM By: [name] Aver MSN RN CNS WTA Entered By: [name] on 08/05/2023  07:57:27 -------------------------------------------------------------------------------- Education Screening Details Patient Name: Date of Service: [name], [name]. 08/05/2023 10:30 A M Medical Record Number: [id number] Patient Account Number: [id number] Date of Birth/Sex: Treating RN: 1945/12/31 (77 y.o. [name] Primary Care Harbor Paster: [name] Other Clinician: Referring [name]: Treating [name]/Extender: [name] BSO [name] [name], [name]: 0 Learning Preferences/Education Level/Primary Language Learning Preference: Explanation, Demonstration Preferred Language: English Cognitive Barrier Language Barrier: No Translator Needed: No Memory Deficit: No Emotional Barrier: No Cultural/Religious Beliefs Affecting Medical Care: No Physical Barrier Impaired Vision: Yes Impaired Hearing: No Decreased Hand dexterity: No Knowledge/Comprehension Knowledge Level: High Comprehension Level: High Ability to understand written instructions: High Ability to understand verbal instructions: High Motivation Anxiety Level: Calm Cooperation: Cooperative Education Importance: Acknowledges Need Interest in Health Problems: Asks Questions Perception: Coherent Willingness to Engage in Self-Management High Activities: Readiness to Engage in Self-Management High Activities: Electronic Signature(s) Signed: 08/05/2023 5:31:20 PM By: [name] Aver MSN RN CNS WTA Entered By: [name] on 08/05/2023 07:58:12 [name] ([id number]) [id number] Page 3 of 5 -------------------------------------------------------------------------------- Fall Risk Assessment Details Patient Name: Date of Service: [name], [name] 08/05/2023 10:30 A M Medical Record Number: [id number] Patient Account Number: [id number] Date of Birth/Sex: Treating RN: 14-Dec-1945 (77 y.o. [name] Primary Care [name]: [name] Other  Clinician: Referring [name]: Treating [name]/Extender: [name] [name], [name]: 0 Fall Risk Assessment Items Have you had 2 or more falls in the last 12 monthso 0 No Have you had any fall that resulted in injury in the last 12 monthso 0 No FALLS RISK SCREEN History of falling - immediate or within 3 months 0 No Secondary diagnosis (Do you have 2 or more medical diagnoseso) 0 No Ambulatory aid None/bed rest/wheelchair/nurse 0 No Crutches/cane/walker 15 Yes Furniture 0 No Intravenous therapy Access/Saline/Heparin Lock 0 No Gait/Transferring Normal/ bed rest/ wheelchair  0 No Weak (short steps with or without shuffle, stooped but able to lift head while walking, may seek 10 Yes support from furniture) Impaired (short steps with shuffle, may have difficulty arising from chair, head down, impaired 0 No balance) Mental Status Oriented to own ability 0 Yes Electronic Signature(s) Signed: 08/05/2023 5:31:20 PM By: [name] Aver MSN RN CNS WTA Entered By: [name] on 08/05/2023 07:59:07 -------------------------------------------------------------------------------- Foot Assessment Details Patient Name: Date of Service: [name], [name]. 08/05/2023 10:30 A M Medical Record Number: [id number] Patient Account Number: [id number] Date of Birth/Sex: Treating RN: [name] Primary Care Rana Hochstein: [name] Other Clinician: Referring Briar Sword: Treating Almon Whitford/Extender: [name] [name], [name]: 0 Foot Assessment Items Site Locations [name], [name] ([id number]) [id number] Page 4 of 5 + = Sensation present, - = Sensation absent, C = Callus, U = Ulcer R = Redness, W = Warmth, M = Maceration, PU = Pre-ulcerative lesion [name] = Fissure, S = Swelling, D = Dryness Assessment Right: Left: Other Deformity: No No Prior Foot Ulcer: Yes No Prior Amputation:  No No Charcot Joint: No No Ambulatory Status: Ambulatory With Help Assistance Device: Walker [employer] trader) Signed: 08/05/2023 5:31:20 PM By: [name] Aver MSN RN CNS WTA Entered By: [name] on 08/05/2023 08:04:19 -------------------------------------------------------------------------------- Nutrition Risk Screening Details Patient Name: Date of Service: [name], [name]. 08/05/2023 10:30 A M Medical Record Number: [id number] Patient Account Number: [id number] Date of Birth/Sex: Treating RN: 09/28/1946 (77 y.o. [name] Primary Care Erby Sanderson: [name] Other Clinician: Referring Uri Covey: Treating Jaki Steptoe/Extender: [name] G Millsaps, [name]: 0 Height (in): 60 Weight (lbs): 155 Body Mass Index (BMI): 30.3 Nutrition Risk Screening Items Score Screening NUTRITION RISK SCREEN: I have an illness or condition that made me change the kind and/or amount of food I eat 0 No I eat fewer than two meals per day 0 No I eat few fruits and vegetables, or milk products 0 No I have three or more drinks of beer, liquor or wine almost every day 0 No I have tooth or mouth problems that make it hard for me to eat 0 No LITZZY, WAIN [name] ([id number]) [id number] Page 5 of 5 I don't always have enough money to buy the food I need 0 No I eat alone most of the time 0 No I take three or more different prescribed or over-the-counter drugs a day 1 Yes Without wanting to, I have lost or gained 10 pounds in the last six months 0 No I am not always physically able to shop, cook and/or feed myself 0 No Nutrition Protocols Good Risk Protocol 0 No interventions needed Moderate Risk Protocol High Risk Proctocol Risk Level: Good Risk Score: 1 Electronic Signature(s) Signed: 08/05/2023 5:31:20 PM By: [name] Aver MSN RN CNS WTA Entered By: [name] on 08/05/2023 08:00:30

## 2023-08-06 NOTE — Progress Notes (Signed)
Complaints and Symptoms: Negative for: Hives; Itching Integumentary ([address] KENDREA, PACER F ([id number]) [id number] Page 8 of 9 Complaints and Symptoms: Positive for: Wounds Negative for: Bleeding or bruising tendency; Breakdown; Swelling Musculoskeletal Complaints and Symptoms: Negative for: Muscle Pain; Muscle Weakness Psychiatric Complaints and Symptoms: Negative for: Anxiety; Claustrophobia Hematologic/Lymphatic Medical History: Positive for: Anemia Cardiovascular Medical History: Positive for: Peripheral Arterial Disease Gastrointestinal Medical History: Past Medical History Notes: Colostomy Endocrine Medical History: Positive for: Type II Diabetes Treated with: Oral agents Blood sugar tested every day: Yes Tested : Blood sugar testing results: Breakfast: 106 Neurologic Medical History: Past Medical History Notes: 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 Oncologic Medical History: Negative for: Received Chemotherapy; Received Radiation Past Medical History Notes: Rectal and cervical cancer HBO Extended History Items Eyes: Cataracts Immunizations Pneumococcal Vaccine: Received Pneumococcal  Vaccination: Yes Received Pneumococcal Vaccination On or After 60th Birthday: Yes Implantable Devices None Family and Social History Never smoker; Marital Status - Married; Alcohol Use: Never; Drug Use: No History; Caffeine Use: Moderate Electronic Signature(s) Signed: 08/05/2023 5:02:29 PM By: Baltazar Najjar MD Signed: 08/05/2023 5:31:20 PM By: Midge Aver MSN RN CNS WTA Entered By: Midge Aver on 08/05/2023 07:56:25 [name] ([id number]) [id number] Page 9 of 9 -------------------------------------------------------------------------------- SuperBill Details Patient Name: Date of Service: [facility] [place], Elayne Snare 08/05/2023 Medical Record Number: [id number] Patient Account Number: [id number] Date of Birth/Sex: Treating RN: [birth year] (77 y.o. Ginette Pitman Primary Care Provider: Maryelizabeth Rowan Other Clinician: Referring Provider: Treating Provider/Extender: RO BSO N, [name] Richardean Sale, Marlou Sa in Treatment: 0 Diagnosis Coding ICD-10 Codes Code Description [phone] Chronic venous hypertension (idiopathic) with inflammation of bilateral lower extremity L97.818 Non-pressure chronic ulcer of other part of right lower leg with other specified severity L97.821 Non-pressure chronic ulcer of other part of left lower leg limited to breakdown of skin Facility Procedures : CPT4 Code: [id number] Description: 88416 - WOUND CARE VISIT-LEV 5 EST PT Modifier: Quantity: 1 Physician Procedures : CPT4 Code Description Modifier [id number] WC PHYS LEVEL 3 NEW PT ICD-10 Diagnosis Description I87.323 Chronic venous hypertension (idiopathic) with inflammation of bilateral lower extremity L97.818 Non-pressure chronic ulcer of other part of right lower  leg with other specified severity L97.821 Non-pressure chronic ulcer of other part of left lower leg limited to breakdown of skin Quantity: 1 Electronic Signature(s) Signed: 08/05/2023 3:06:15 PM By: Midge Aver  MSN RN CNS WTA Signed: 08/05/2023 5:02:29 PM By: Baltazar Najjar MD Entered By: Midge Aver on 08/05/2023 12:06:15  ABD Pad 5x9 (in/in) 1 x Per Week/30 Days Discharge Instructions: Cover with ABD pad Compression Wrap: Urgo K2 Lite, two layer compression system, regular 1 x Per Week/30 Days Electronic Signature(s) Signed: 08/05/2023 3:04:48 PM By: Midge Aver MSN RN CNS [place] Fl[place], [place] F ([id number]) [id number] Page 4 of 9 Signed: 08/05/2023 5:02:29 PM By: Baltazar Najjar MD Entered By: Midge Aver on 08/05/2023 12:04:47 -------------------------------------------------------------------------------- Problem List Details Patient Name: Date of Service: [facility] NES, BEA TRICE F. 08/05/2023 10:30 A M Medical Record Number: [id number] Patient Account Number: [id number] Date of Birth/Sex: Treating RN: 02-20-1946 (77 y.o. Ginette Pitman Primary Care Provider: Maryelizabeth Rowan Other Clinician: Referring Provider: Treating Provider/Extender: RO BSO N, [name] Richardean Sale, Marlou Sa in Treatment: 0 Active Problems ICD-10 Encounter Code Description Active Date MDM Diagnosis I87.323 Chronic venous hypertension (idiopathic) with inflammation of bilateral lower 08/05/2023 No Yes extremity L97.818 Non-pressure chronic ulcer of other part of right lower leg with other specified 08/05/2023 No Yes severity L97.821 Non-pressure chronic ulcer of other part of left lower leg limited to breakdown 08/05/2023 No Yes of skin Inactive Problems Resolved Problems Electronic Signature(s) Signed: 08/05/2023 5:02:29 PM By: Baltazar Najjar MD Entered By: Baltazar Najjar on 08/05/2023 09:21:42 -------------------------------------------------------------------------------- Progress Note Details Patient Name: Date of Service: [facility] NES, BEA TRICE F.  08/05/2023 10:30 A M Medical Record Number: [id number] Patient Account Number: [id number] Date of Birth/Sex: Treating RN: 04-Sep-1946 (77 y.o. Ginette Pitman Primary Care Provider: Maryelizabeth Rowan Other Clinician: [name] ([id number]) [id number] Page 5 of 9 Referring Provider: Treating Provider/Extender: RO BSO N, [name] G Millsaps, Kimberly Weeks in Treatment: 0 Subjective Chief Complaint Information obtained from Patient 08/05/2023; patient is here for review of small weeping areas on her bilateral lower legs History of Present Illness (HPI) ADMISSION 08/05/2023 This is a 77 year old woman I think who is were suggested to come to our clinic by their primary care office with regards to weeping edema of the right greater than left lower leg. I have reviewed a note from Dr. Mancel Bale about her underlying medical issues which include a remote history of cervical cancer that recurred and a history of rectal cancer more recently. She is also a type II diabetic with peripheral neuropathy, COPD has a colostomy and has PAD. Apparently over the last several months she has developed small weeping areas on her anterior and posterior lower legs in the setting of increasing edema in the right greater than the left leg. I note that she has been fairly aggressively worked up for this through Dr. Kalman Drape staff. This is included several DVT rule outs which were negative most recently in June 2024. She also had a CT scan of the abdomen and pelvis presumably looking for central venous compression or obstruction and that was negative also in June. Nevertheless her daughter says that the edema in her right leg has been increasing and with the increase in edema edema there is weeping fluid. Past medical history is as listed above. ABIs in our clinic were 0.69 on the right and 0.75 on the left. Patient History Information obtained from Patient,  Caregiver. Allergies codeine (Severity: Moderate) Social History Never smoker, Marital Status - Married, Alcohol Use - Never, Drug Use - No History, Caffeine Use - Moderate. Medical History Eyes Patient has history of Cataracts - Surgery bilateral Hematologic/Lymphatic Patient has history of Anemia Respiratory Patient has history of Chronic Obstructive Pulmonary Disease (COPD) Cardiovascular Patient has history of Peripheral Arterial Disease Endocrine Patient has history of  necrotic tissue within the wound bed. Wound #4 status is Open. Original cause of wound was Gradually Appeared. The date acquired was: 08/05/2023. The wound is located on the Left,Lateral Lower Leg. The wound measures 2.2cm length x 0.3cm width x 0.1cm depth; 0.518cm^2 area and 0.052cm^3 volume. There is no tunneling or undermining noted. There is a medium amount of serosanguineous drainage noted. There is medium (34-66%) red, pink granulation within the wound bed. There is no necrotic tissue within the wound bed. Assessment Active Problems ICD-10 Chronic venous hypertension (idiopathic) with inflammation of bilateral lower extremity Non-pressure chronic ulcer of other part of right lower leg with other specified severity Non-pressure chronic ulcer of other part of left lower leg limited to breakdown of skin Procedures Wound #2 Pre-procedure diagnosis of Wound #2 is a Diabetic Wound/Ulcer of the Lower Extremity located on the Right,Medial Lower Leg . There was a Three Layer Compression Therapy Procedure by Midge Aver, RN. Post procedure Diagnosis Wound #2: Same as Pre-Procedure Wound #3 Pre-procedure diagnosis of Wound #3 is a Diabetic Wound/Ulcer of the Lower Extremity located on the Left,Posterior Lower Leg . There was a Three Layer Compression Therapy Procedure by Midge Aver,  RN. Post procedure Diagnosis Wound #3: Same as Pre-Procedure Wound #4 Pre-procedure diagnosis of Wound #4 is a Diabetic Wound/Ulcer of the Lower Extremity located on the Left,Lateral Lower Leg . There was a Three Layer Compression Therapy Procedure by Midge Aver, RN. Post procedure Diagnosis Wound #4: Same as Pre-Procedure Plan SALOMA, NIGRO ([id number]) [phone].pdf Page 7 of 9 1. Small weeping wounds on the right greater than left leg. This is no doubt secondary to pitting edema. 2. Significant edema of the right leg into the groin. This is pitting. No evidence of cellulitis. I note that this was fairly extensively worked up in June including a CT scan of the pelvis and duplex ultrasound of the right leg that did not show evidence of a DVT or venous obstruction. Although the CT of the pelvis did show lymph nodes at the inguinal ring. There was no evidence of obstruction. Nevertheless if the edema in the right leg continues to increase these tests may need to be repeated 3. There was no choice but to put some degree of compression on her lower legs we used an Urgo K2 light 20 to 30 mm of compression. I think she should be able to tolerate this. Her reduced ABI is of some concern bilaterally although some of this may be secondary to the edema 4. We asked them to call us if she cannot tolerate the compression. Electronic Signature(s) Signed: 08/05/2023 5:02:29 PM By: Baltazar Najjar MD Entered By: Baltazar Najjar on 08/05/2023 09:36:54 -------------------------------------------------------------------------------- ROS/PFSH Details Patient Name: Date of Service: [facility] NES, BEA TRICE F. 08/05/2023 10:30 A M Medical Record Number: [id number] Patient Account Number: [id number] Date of Birth/Sex: Treating RN: 03-Jun-1946 (77 y.o. Ginette Pitman Primary Care Provider: Maryelizabeth Rowan Other Clinician: Referring Provider: Treating Provider/Extender: RO BSO N, [name]  Richardean Sale, Marlou Sa in Treatment: 0 Information Obtained From Patient Caregiver Constitutional Symptoms (General Health) Complaints and Symptoms: Negative for: Fatigue; Fever; Chills; Marked Weight Change Eyes Complaints and Symptoms: Positive for: Glasses / Contacts Medical History: Positive for: Cataracts - Surgery bilateral Ear/Nose/Mouth/Throat Complaints and Symptoms: Negative for: Difficult clearing ears; Sinusitis Respiratory Complaints and Symptoms: Negative for: Chronic or frequent coughs; Shortness of Breath Medical History: Positive for: Chronic Obstructive Pulmonary Disease (COPD) Genitourinary Complaints and Symptoms: Negative for: Kidney failure/ Dialysis; Incontinence/dribbling Immunological  necrotic tissue within the wound bed. Wound #4 status is Open. Original cause of wound was Gradually Appeared. The date acquired was: 08/05/2023. The wound is located on the Left,Lateral Lower Leg. The wound measures 2.2cm length x 0.3cm width x 0.1cm depth; 0.518cm^2 area and 0.052cm^3 volume. There is no tunneling or undermining noted. There is a medium amount of serosanguineous drainage noted. There is medium (34-66%) red, pink granulation within the wound bed. There is no necrotic tissue within the wound bed. Assessment Active Problems ICD-10 Chronic venous hypertension (idiopathic) with inflammation of bilateral lower extremity Non-pressure chronic ulcer of other part of right lower leg with other specified severity Non-pressure chronic ulcer of other part of left lower leg limited to breakdown of skin Procedures Wound #2 Pre-procedure diagnosis of Wound #2 is a Diabetic Wound/Ulcer of the Lower Extremity located on the Right,Medial Lower Leg . There was a Three Layer Compression Therapy Procedure by Midge Aver, RN. Post procedure Diagnosis Wound #2: Same as Pre-Procedure Wound #3 Pre-procedure diagnosis of Wound #3 is a Diabetic Wound/Ulcer of the Lower Extremity located on the Left,Posterior Lower Leg . There was a Three Layer Compression Therapy Procedure by Midge Aver,  RN. Post procedure Diagnosis Wound #3: Same as Pre-Procedure Wound #4 Pre-procedure diagnosis of Wound #4 is a Diabetic Wound/Ulcer of the Lower Extremity located on the Left,Lateral Lower Leg . There was a Three Layer Compression Therapy Procedure by Midge Aver, RN. Post procedure Diagnosis Wound #4: Same as Pre-Procedure Plan SALOMA, NIGRO ([id number]) [phone].pdf Page 7 of 9 1. Small weeping wounds on the right greater than left leg. This is no doubt secondary to pitting edema. 2. Significant edema of the right leg into the groin. This is pitting. No evidence of cellulitis. I note that this was fairly extensively worked up in June including a CT scan of the pelvis and duplex ultrasound of the right leg that did not show evidence of a DVT or venous obstruction. Although the CT of the pelvis did show lymph nodes at the inguinal ring. There was no evidence of obstruction. Nevertheless if the edema in the right leg continues to increase these tests may need to be repeated 3. There was no choice but to put some degree of compression on her lower legs we used an Urgo K2 light 20 to 30 mm of compression. I think she should be able to tolerate this. Her reduced ABI is of some concern bilaterally although some of this may be secondary to the edema 4. We asked them to call us if she cannot tolerate the compression. Electronic Signature(s) Signed: 08/05/2023 5:02:29 PM By: Baltazar Najjar MD Entered By: Baltazar Najjar on 08/05/2023 09:36:54 -------------------------------------------------------------------------------- ROS/PFSH Details Patient Name: Date of Service: [facility] NES, BEA TRICE F. 08/05/2023 10:30 A M Medical Record Number: [id number] Patient Account Number: [id number] Date of Birth/Sex: Treating RN: 03-Jun-1946 (77 y.o. Ginette Pitman Primary Care Provider: Maryelizabeth Rowan Other Clinician: Referring Provider: Treating Provider/Extender: RO BSO N, [name]  Richardean Sale, Marlou Sa in Treatment: 0 Information Obtained From Patient Caregiver Constitutional Symptoms (General Health) Complaints and Symptoms: Negative for: Fatigue; Fever; Chills; Marked Weight Change Eyes Complaints and Symptoms: Positive for: Glasses / Contacts Medical History: Positive for: Cataracts - Surgery bilateral Ear/Nose/Mouth/Throat Complaints and Symptoms: Negative for: Difficult clearing ears; Sinusitis Respiratory Complaints and Symptoms: Negative for: Chronic or frequent coughs; Shortness of Breath Medical History: Positive for: Chronic Obstructive Pulmonary Disease (COPD) Genitourinary Complaints and Symptoms: Negative for: Kidney failure/ Dialysis; Incontinence/dribbling Immunological  ABD Pad 5x9 (in/in) 1 x Per Week/30 Days Discharge Instructions: Cover with ABD pad Compression Wrap: Urgo K2 Lite, two layer compression system, regular 1 x Per Week/30 Days Electronic Signature(s) Signed: 08/05/2023 3:04:48 PM By: Midge Aver MSN RN CNS [place] Fl[place], [place] F ([id number]) [id number] Page 4 of 9 Signed: 08/05/2023 5:02:29 PM By: Baltazar Najjar MD Entered By: Midge Aver on 08/05/2023 12:04:47 -------------------------------------------------------------------------------- Problem List Details Patient Name: Date of Service: [facility] NES, BEA TRICE F. 08/05/2023 10:30 A M Medical Record Number: [id number] Patient Account Number: [id number] Date of Birth/Sex: Treating RN: 02-20-1946 (77 y.o. Ginette Pitman Primary Care Provider: Maryelizabeth Rowan Other Clinician: Referring Provider: Treating Provider/Extender: RO BSO N, [name] Richardean Sale, Marlou Sa in Treatment: 0 Active Problems ICD-10 Encounter Code Description Active Date MDM Diagnosis I87.323 Chronic venous hypertension (idiopathic) with inflammation of bilateral lower 08/05/2023 No Yes extremity L97.818 Non-pressure chronic ulcer of other part of right lower leg with other specified 08/05/2023 No Yes severity L97.821 Non-pressure chronic ulcer of other part of left lower leg limited to breakdown 08/05/2023 No Yes of skin Inactive Problems Resolved Problems Electronic Signature(s) Signed: 08/05/2023 5:02:29 PM By: Baltazar Najjar MD Entered By: Baltazar Najjar on 08/05/2023 09:21:42 -------------------------------------------------------------------------------- Progress Note Details Patient Name: Date of Service: JO NES, BEA TRICE F.  08/05/2023 10:30 A M Medical Record Number: [id number] Patient Account Number: [id number] Date of Birth/Sex: Treating RN: 04-Sep-1946 (77 y.o. Ginette Pitman Primary Care Provider: Maryelizabeth Rowan Other Clinician: [name] ([id number]) [id number] Page 5 of 9 Referring Provider: Treating Provider/Extender: RO BSO N, [name] G Millsaps, Kimberly Weeks in Treatment: 0 Subjective Chief Complaint Information obtained from Patient 08/05/2023; patient is here for review of small weeping areas on her bilateral lower legs History of Present Illness (HPI) ADMISSION 08/05/2023 This is a 77 year old woman I think who is were suggested to come to our clinic by their primary care office with regards to weeping edema of the right greater than left lower leg. I have reviewed a note from Dr. Mancel Bale about her underlying medical issues which include a remote history of cervical cancer that recurred and a history of rectal cancer more recently. She is also a type II diabetic with peripheral neuropathy, COPD has a colostomy and has PAD. Apparently over the last several months she has developed small weeping areas on her anterior and posterior lower legs in the setting of increasing edema in the right greater than the left leg. I note that she has been fairly aggressively worked up for this through Dr. Kalman Drape staff. This is included several DVT rule outs which were negative most recently in June 2024. She also had a CT scan of the abdomen and pelvis presumably looking for central venous compression or obstruction and that was negative also in June. Nevertheless her daughter says that the edema in her right leg has been increasing and with the increase in edema edema there is weeping fluid. Past medical history is as listed above. ABIs in our clinic were 0.69 on the right and 0.75 on the left. Patient History Information obtained from Patient,  Caregiver. Allergies codeine (Severity: Moderate) Social History Never smoker, Marital Status - Married, Alcohol Use - Never, Drug Use - No History, Caffeine Use - Moderate. Medical History Eyes Patient has history of Cataracts - Surgery bilateral Hematologic/Lymphatic Patient has history of Anemia Respiratory Patient has history of Chronic Obstructive Pulmonary Disease (COPD) Cardiovascular Patient has history of Peripheral Arterial Disease Endocrine Patient has history of  necrotic tissue within the wound bed. Wound #4 status is Open. Original cause of wound was Gradually Appeared. The date acquired was: 08/05/2023. The wound is located on the Left,Lateral Lower Leg. The wound measures 2.2cm length x 0.3cm width x 0.1cm depth; 0.518cm^2 area and 0.052cm^3 volume. There is no tunneling or undermining noted. There is a medium amount of serosanguineous drainage noted. There is medium (34-66%) red, pink granulation within the wound bed. There is no necrotic tissue within the wound bed. Assessment Active Problems ICD-10 Chronic venous hypertension (idiopathic) with inflammation of bilateral lower extremity Non-pressure chronic ulcer of other part of right lower leg with other specified severity Non-pressure chronic ulcer of other part of left lower leg limited to breakdown of skin Procedures Wound #2 Pre-procedure diagnosis of Wound #2 is a Diabetic Wound/Ulcer of the Lower Extremity located on the Right,Medial Lower Leg . There was a Three Layer Compression Therapy Procedure by Midge Aver, RN. Post procedure Diagnosis Wound #2: Same as Pre-Procedure Wound #3 Pre-procedure diagnosis of Wound #3 is a Diabetic Wound/Ulcer of the Lower Extremity located on the Left,Posterior Lower Leg . There was a Three Layer Compression Therapy Procedure by Midge Aver,  RN. Post procedure Diagnosis Wound #3: Same as Pre-Procedure Wound #4 Pre-procedure diagnosis of Wound #4 is a Diabetic Wound/Ulcer of the Lower Extremity located on the Left,Lateral Lower Leg . There was a Three Layer Compression Therapy Procedure by Midge Aver, RN. Post procedure Diagnosis Wound #4: Same as Pre-Procedure Plan SALOMA, NIGRO ([id number]) [phone].pdf Page 7 of 9 1. Small weeping wounds on the right greater than left leg. This is no doubt secondary to pitting edema. 2. Significant edema of the right leg into the groin. This is pitting. No evidence of cellulitis. I note that this was fairly extensively worked up in June including a CT scan of the pelvis and duplex ultrasound of the right leg that did not show evidence of a DVT or venous obstruction. Although the CT of the pelvis did show lymph nodes at the inguinal ring. There was no evidence of obstruction. Nevertheless if the edema in the right leg continues to increase these tests may need to be repeated 3. There was no choice but to put some degree of compression on her lower legs we used an Urgo K2 light 20 to 30 mm of compression. I think she should be able to tolerate this. Her reduced ABI is of some concern bilaterally although some of this may be secondary to the edema 4. We asked them to call us if she cannot tolerate the compression. Electronic Signature(s) Signed: 08/05/2023 5:02:29 PM By: Baltazar Najjar MD Entered By: Baltazar Najjar on 08/05/2023 09:36:54 -------------------------------------------------------------------------------- ROS/PFSH Details Patient Name: Date of Service: [facility] NES, BEA TRICE F. 08/05/2023 10:30 A M Medical Record Number: [id number] Patient Account Number: [id number] Date of Birth/Sex: Treating RN: 03-Jun-1946 (77 y.o. Ginette Pitman Primary Care Provider: Maryelizabeth Rowan Other Clinician: Referring Provider: Treating Provider/Extender: RO BSO N, [name]  Richardean Sale, Marlou Sa in Treatment: 0 Information Obtained From Patient Caregiver Constitutional Symptoms (General Health) Complaints and Symptoms: Negative for: Fatigue; Fever; Chills; Marked Weight Change Eyes Complaints and Symptoms: Positive for: Glasses / Contacts Medical History: Positive for: Cataracts - Surgery bilateral Ear/Nose/Mouth/Throat Complaints and Symptoms: Negative for: Difficult clearing ears; Sinusitis Respiratory Complaints and Symptoms: Negative for: Chronic or frequent coughs; Shortness of Breath Medical History: Positive for: Chronic Obstructive Pulmonary Disease (COPD) Genitourinary Complaints and Symptoms: Negative for: Kidney failure/ Dialysis; Incontinence/dribbling Immunological

## 2023-08-10 ENCOUNTER — Other Ambulatory Visit: Payer: Self-pay | Admitting: Oncology

## 2023-08-12 ENCOUNTER — Encounter: Payer: Medicare HMO | Admitting: Internal Medicine

## 2023-08-12 DIAGNOSIS — E1151 Type 2 diabetes mellitus with diabetic peripheral angiopathy without gangrene: Secondary | ICD-10-CM | POA: Diagnosis not present

## 2023-08-14 NOTE — Progress Notes (Signed)
N, [name] EL Sung Amabile Weeks in Treatment: 1 Verbal / Phone Orders: No Diagnosis Coding Follow-up Appointments Return Appointment in 1 week. Bathing/ Shower/ Hygiene May shower with wound dressing protected with water repellent cover or cast protector. No tub bath. Edema Control - Lymphedema / Segmental Compressive Device / Other UrgoK2 LITE Elevate legs to the level of the heart and pump ankles as often as possible Wound Treatment Wound #1 - T [place] Wound Laterality: Right, Posterior Cleanser: Soap and Water 1 x Per Week/30 Days Discharge Instructions: Gently cleanse wound with antibacterial soap, rinse and pat dry prior to dressing wounds Topical: Triamcinolone Acetonide Cream, 0.1%, 15 (g) tube 1 x Per Week/30 Days Discharge Instructions: Apply as directed by provider. Compression Wrap: Urgo K2 Lite, two layer compression system, regular 1 x Per Week/30 Days Wound #2 - Lower Leg Wound Laterality: Right, Medial Cleanser: Soap and Water 1 x Per Week/30 Days Discharge Instructions: Gently cleanse wound with antibacterial soap, rinse and pat dry prior to dressing wounds Topical: Triamcinolone Acetonide Cream, 0.1%, 15 (g) tube 1 x Per Week/30 Days Discharge Instructions: Apply as directed by provider. Compression Wrap: Urgo K2 Lite, two layer compression system, regular 1 x Per Week/30 Days Wound #3 - Lower Leg Wound Laterality: Left, Posterior Cleanser: Soap and Water 1 x Per Week/30 Days Discharge Instructions: Gently cleanse wound with antibacterial soap, rinse and pat dry prior to dressing wounds Topical: Triamcinolone Acetonide Cream, 0.1%, 15 (g) tube 1 x Per Week/30 Days Discharge Instructions: Apply as directed by provider. Compression Wrap: Urgo K2 Lite, two layer compression system, regular 1 x Per Week/30 Days Wound #4 - Lower Leg Wound Laterality: Left, Lateral [name], [name] ([id number]) [id number] Page 3 of 6 Cleanser: Soap and Water 1 x Per Week/30 Days Discharge Instructions: Gently cleanse wound with antibacterial soap, rinse and pat dry prior to dressing wounds Topical: Triamcinolone Acetonide Cream, 0.1%, 15 (g) tube 1 x Per Week/30 Days Discharge Instructions: Apply as directed by provider. Compression Wrap: Urgo K2 Lite, two layer compression system, regular 1 x Per Week/30 Days Electronic Signature(s) Signed: 08/12/2023 4:28:36 PM By: [name] Najjar MD Signed: 08/14/2023 12:30:42 PM By: [name] Aver MSN RN CNS WTA Entered By: [name] on 08/12/2023 12:23:02 -------------------------------------------------------------------------------- Problem List Details Patient Name: Date of Service: [name], [name]. 08/12/2023 2:15 PM Medical Record Number: [id number] Patient Account Number: [id number] Date of Birth/Sex: Treating RN: 01-05-1946 (77 y.o. [name] Primary Care Provider: [name] Other Clinician: Referring Provider: Treating Provider/Extender: [name], [name] in Treatment: 1 Active Problems ICD-10 Encounter Code Description Active Date MDM Diagnosis I87.323 Chronic venous hypertension (idiopathic) with inflammation of bilateral lower 08/05/2023 No Yes extremity L97.818 Non-pressure chronic ulcer of other part of right lower leg with other specified 08/05/2023 No Yes severity L97.821 Non-pressure chronic ulcer of other part of left lower leg limited to breakdown 08/05/2023 No Yes of skin Inactive Problems Resolved Problems Electronic Signature(s) Signed: 08/12/2023 4:28:36 PM By: [name] Najjar MD Entered By: [name] on 08/12/2023 12:36:29 [name] ([id number]) [id number] Page 4 of 6 -------------------------------------------------------------------------------- Progress Note Details Patient Name: Date of  Service: [name], [name] 08/12/2023 2:15 PM Medical Record Number: [id number] Patient Account Number: [id number] Date of Birth/Sex: Treating RN: 1946/04/09 (77 y.o. [name] Primary Care Provider: [name] Other Clinician: Referring Provider: Treating Provider/Extender: [name], [name] in Treatment: 1 Subjective History  [name], [name] ([id number]) [id number] Page 1 of 6 Visit Report for 08/12/2023 HPI Details Patient Name: Date of Service: [facility] [name], [name] 08/12/2023 2:15 PM Medical Record Number: [id number] Patient Account Number: [id number] Date of Birth/Sex: Treating RN: 1946/11/17 (77 y.o. [name] Primary Care Provider: [name] Other Clinician: Referring Provider: Treating Provider/Extender: [name], [name] in Treatment: 1 History of Present Illness HPI Description: ADMISSION 08/05/2023 This is a 77 year old woman I think who is were suggested to come to our clinic by their primary care office with regards to weeping edema of the right greater than left lower leg. I have reviewed a note from [name] about her underlying medical issues which include a remote history of cervical cancer that recurred and a history of rectal cancer more recently. She is also a type II diabetic with peripheral neuropathy, COPD has a colostomy and has PAD. Apparently over the last several months she has developed small weeping areas on her anterior and posterior lower legs in the setting of increasing edema in the right greater than the left leg. I note that she has been fairly aggressively worked up for this through [name] staff. This is included several DVT rule outs which were negative most recently in June 2024. She also had a CT scan of the abdomen and pelvis presumably looking for central venous compression or obstruction and that was negative also in June. Nevertheless her daughter says that the edema in her right leg has been increasing and with the increase in edema edema there is weeping fluid. Past medical history is as listed above. ABIs in our clinic were 0.69 on the right and 0.75 on the left. 9/25; patient returns today most of the major weeping areas of close there is still bilateral small eschared areas however  everything looks a lot better. She is going to need compression stockings. Electronic Signature(s) Signed: 08/12/2023 4:28:36 PM By: [name] Najjar MD Entered By: [name] on 08/12/2023 12:38:11 -------------------------------------------------------------------------------- Physical Exam Details Patient Name: Date of Service: [name], [name] TRICE [name]. 08/12/2023 2:15 PM Medical Record Number: [id number] Patient Account Number: [id number] Date of Birth/Sex: Treating RN: 01-Apr-1946 (77 y.o. [name] Primary Care Provider: [name] Other Clinician: Referring Provider: Treating Provider/Extender: [name], [name] in Treatment: 1 Constitutional Patient is hypertensive.. Pulse regular and within target range for patient.Marland Kitchen Respirations regular, non-labored and within target range.. Temperature is normal and within the target range for the patient.Marland Kitchen appears in no distress. [name], [name] ([id number]) [id number] Page 2 of 6 Notes Wound exam; most of the weeping areas that were present last week have closed this was under Urgo K2 light compression. There is no evidence of cellulitis or acute DVT She does have pitting edema that extends up the right leg into the groin. I noted that last week. She has also had previous DVT rule outs in June . CT scans of her pelvis I think also in the June timeframe. None of this was revealing for a cause of venous obstruction Electronic Signature(s) Signed: 08/12/2023 4:28:36 PM By: [name] Najjar MD Entered By: [name] on 08/12/2023 12:39:57 -------------------------------------------------------------------------------- Physician Orders Details Patient Name: Date of Service: [name], [name] TRICE [name]. 08/12/2023 2:15 PM Medical Record Number: [id number] Patient Account Number: [id number] Date of Birth/Sex: Treating RN: 14-Mar-1946 (77 y.o. [name] Primary Care Provider: [name] Other Clinician: Referring Provider: Treating Provider/Extender: RO BSO  [name], [name] ([id number]) [id number] Page 1 of 6 Visit Report for 08/12/2023 HPI Details Patient Name: Date of Service: [facility] [name], [name] 08/12/2023 2:15 PM Medical Record Number: [id number] Patient Account Number: [id number] Date of Birth/Sex: Treating RN: 1946/11/17 (77 y.o. [name] Primary Care Provider: [name] Other Clinician: Referring Provider: Treating Provider/Extender: [name], [name] in Treatment: 1 History of Present Illness HPI Description: ADMISSION 08/05/2023 This is a 77 year old woman I think who is were suggested to come to our clinic by their primary care office with regards to weeping edema of the right greater than left lower leg. I have reviewed a note from [name] about her underlying medical issues which include a remote history of cervical cancer that recurred and a history of rectal cancer more recently. She is also a type II diabetic with peripheral neuropathy, COPD has a colostomy and has PAD. Apparently over the last several months she has developed small weeping areas on her anterior and posterior lower legs in the setting of increasing edema in the right greater than the left leg. I note that she has been fairly aggressively worked up for this through [name] staff. This is included several DVT rule outs which were negative most recently in June 2024. She also had a CT scan of the abdomen and pelvis presumably looking for central venous compression or obstruction and that was negative also in June. Nevertheless her daughter says that the edema in her right leg has been increasing and with the increase in edema edema there is weeping fluid. Past medical history is as listed above. ABIs in our clinic were 0.69 on the right and 0.75 on the left. 9/25; patient returns today most of the major weeping areas of close there is still bilateral small eschared areas however  everything looks a lot better. She is going to need compression stockings. Electronic Signature(s) Signed: 08/12/2023 4:28:36 PM By: [name] Najjar MD Entered By: [name] on 08/12/2023 12:38:11 -------------------------------------------------------------------------------- Physical Exam Details Patient Name: Date of Service: [name], [name] TRICE [name]. 08/12/2023 2:15 PM Medical Record Number: [id number] Patient Account Number: [id number] Date of Birth/Sex: Treating RN: 01-Apr-1946 (77 y.o. [name] Primary Care Provider: [name] Other Clinician: Referring Provider: Treating Provider/Extender: [name], [name] in Treatment: 1 Constitutional Patient is hypertensive.. Pulse regular and within target range for patient.Marland Kitchen Respirations regular, non-labored and within target range.. Temperature is normal and within the target range for the patient.Marland Kitchen appears in no distress. [name], [name] ([id number]) [id number] Page 2 of 6 Notes Wound exam; most of the weeping areas that were present last week have closed this was under Urgo K2 light compression. There is no evidence of cellulitis or acute DVT She does have pitting edema that extends up the right leg into the groin. I noted that last week. She has also had previous DVT rule outs in June . CT scans of her pelvis I think also in the June timeframe. None of this was revealing for a cause of venous obstruction Electronic Signature(s) Signed: 08/12/2023 4:28:36 PM By: [name] Najjar MD Entered By: [name] on 08/12/2023 12:39:57 -------------------------------------------------------------------------------- Physician Orders Details Patient Name: Date of Service: [name], [name] TRICE [name]. 08/12/2023 2:15 PM Medical Record Number: [id number] Patient Account Number: [id number] Date of Birth/Sex: Treating RN: 14-Mar-1946 (77 y.o. [name] Primary Care Provider: [name] Other Clinician: Referring Provider: Treating Provider/Extender: RO BSO  K2 Lite, two layer compression system, regular 1 x Per Week/30 Days WOUND #4: - Lower Leg Wound Laterality: Left, Lateral Cleanser: Soap and Water 1 x Per Week/30 Days Discharge Instructions: Gently cleanse wound with antibacterial soap, rinse and pat dry prior to dressing wounds Topical: Triamcinolone Acetonide Cream, 0.1%, 15 (g) tube 1 x Per Week/30 Days Discharge Instructions: Apply as directed by provider. Com pression Wrap: Urgo K2 Lite, two layer compression system, regular 1 x Per Week/30 Days 1. This patient's weeping areas predominantly on the right but also on the left lower leg have closed over. 2. She still has bilateral eschared areas but I do not expect this will cause much difficulty for her. [name], [name] ([id number]) [id number] Page 6 of 6 3. She is going to need bilateral compression stockings we are going to order her bilateral juxta lite stockings that she can use to help control the swelling in her lower extremity 4. My ideas about the right leg swelling have already been addressed by her oncologist. 5. She should be dischargeable next week Electronic Signature(s) Signed: 08/12/2023 4:28:36 PM By: [name] Najjar MD Entered By: [name] on 08/12/2023 12:43:06 -------------------------------------------------------------------------------- SuperBill Details Patient Name: Date of Service: [name], [name] 08/12/2023 Medical Record Number: [id number] Patient Account Number: [id number] Date of Birth/Sex: Treating RN: June 28, 1946 (77 y.o. [name] Primary Care Provider: [name] Other Clinician: Referring Provider: Treating Provider/Extender: [name], [name] in Treatment: 1 Diagnosis Coding ICD-10 Codes Code Description [id number] Chronic venous hypertension (idiopathic) with inflammation of bilateral lower extremity L97.818 Non-pressure chronic ulcer of other part of  right lower leg with other specified severity L97.821 Non-pressure chronic ulcer of other part of left lower leg limited to breakdown of skin Facility Procedures : CPT4: Code [id number] Description: 81 BILATERAL: Application of multi-layer venous compression system; leg (below knee), including ankle and t. Modifier: Quantity: 1 Physician Procedures : CPT4 Code Description Modifier [id number] - WC PHYS LEVEL 3 - EST PT ICD-10 Diagnosis Description I87.323 Chronic venous hypertension (idiopathic) with inflammation of bilateral lower extremity L97.818 Non-pressure chronic ulcer of other part of  right lower leg with other specified severity L97.821 Non-pressure chronic ulcer of other part of left lower leg limited to breakdown of skin Quantity: 1 Electronic Signature(s) Signed: 08/12/2023 4:28:36 PM By: [name] Najjar MD Entered By: [name] on 08/12/2023 12:44:20  [name], [name] ([id number]) [id number] Page 1 of 6 Visit Report for 08/12/2023 HPI Details Patient Name: Date of Service: [facility] [name], [name] 08/12/2023 2:15 PM Medical Record Number: [id number] Patient Account Number: [id number] Date of Birth/Sex: Treating RN: 1946/11/17 (77 y.o. [name] Primary Care Provider: [name] Other Clinician: Referring Provider: Treating Provider/Extender: [name], [name] in Treatment: 1 History of Present Illness HPI Description: ADMISSION 08/05/2023 This is a 77 year old woman I think who is were suggested to come to our clinic by their primary care office with regards to weeping edema of the right greater than left lower leg. I have reviewed a note from [name] about her underlying medical issues which include a remote history of cervical cancer that recurred and a history of rectal cancer more recently. She is also a type II diabetic with peripheral neuropathy, COPD has a colostomy and has PAD. Apparently over the last several months she has developed small weeping areas on her anterior and posterior lower legs in the setting of increasing edema in the right greater than the left leg. I note that she has been fairly aggressively worked up for this through [name] staff. This is included several DVT rule outs which were negative most recently in June 2024. She also had a CT scan of the abdomen and pelvis presumably looking for central venous compression or obstruction and that was negative also in June. Nevertheless her daughter says that the edema in her right leg has been increasing and with the increase in edema edema there is weeping fluid. Past medical history is as listed above. ABIs in our clinic were 0.69 on the right and 0.75 on the left. 9/25; patient returns today most of the major weeping areas of close there is still bilateral small eschared areas however  everything looks a lot better. She is going to need compression stockings. Electronic Signature(s) Signed: 08/12/2023 4:28:36 PM By: [name] Najjar MD Entered By: [name] on 08/12/2023 12:38:11 -------------------------------------------------------------------------------- Physical Exam Details Patient Name: Date of Service: [name], [name] TRICE [name]. 08/12/2023 2:15 PM Medical Record Number: [id number] Patient Account Number: [id number] Date of Birth/Sex: Treating RN: 01-Apr-1946 (77 y.o. [name] Primary Care Provider: [name] Other Clinician: Referring Provider: Treating Provider/Extender: [name], [name] in Treatment: 1 Constitutional Patient is hypertensive.. Pulse regular and within target range for patient.Marland Kitchen Respirations regular, non-labored and within target range.. Temperature is normal and within the target range for the patient.Marland Kitchen appears in no distress. [name], [name] ([id number]) [id number] Page 2 of 6 Notes Wound exam; most of the weeping areas that were present last week have closed this was under Urgo K2 light compression. There is no evidence of cellulitis or acute DVT She does have pitting edema that extends up the right leg into the groin. I noted that last week. She has also had previous DVT rule outs in June . CT scans of her pelvis I think also in the June timeframe. None of this was revealing for a cause of venous obstruction Electronic Signature(s) Signed: 08/12/2023 4:28:36 PM By: [name] Najjar MD Entered By: [name] on 08/12/2023 12:39:57 -------------------------------------------------------------------------------- Physician Orders Details Patient Name: Date of Service: [name], [name] TRICE [name]. 08/12/2023 2:15 PM Medical Record Number: [id number] Patient Account Number: [id number] Date of Birth/Sex: Treating RN: 14-Mar-1946 (77 y.o. [name] Primary Care Provider: [name] Other Clinician: Referring Provider: Treating Provider/Extender: RO BSO

## 2023-08-14 NOTE — Progress Notes (Signed)
[name], PENNINGS [name] ([id number]) [id number] Page 1 of 15 Visit Report for 08/12/2023 Arrival Information Details Patient Name: Date of Service: [facility] [name], [name] 08/12/2023 2:15 PM Medical Record Number: [id number] Patient Account Number: [id number] Date of Birth/Sex: Treating RN: 1946/04/13 (77 y.o. Ginette Pitman Primary Care Reeda Soohoo: Maryelizabeth Rowan Other Clinician: Referring Terri Malerba: Treating Sayre Witherington/Extender: Chauncey Mann, [name] [place] in Treatment: 1 Visit Information History Since Last Visit Added or deleted any medications: No Patient Arrived: [name] Any new allergies or adverse reactions: No Arrival Time: 14:53 Has Dressing in Place as Prescribed: Yes Accompanied By: daughter Has Compression in Place as Prescribed: Yes Transfer Assistance: None Pain Present Now: No Patient Identification Verified: Yes Secondary Verification Process Completed: Yes Patient Requires Transmission-Based Precautions: No Patient Has Alerts: Yes Patient Alerts: diabetic Plavix Electronic Signature(s) Signed: 08/14/2023 12:30:42 PM By: Midge Aver MSN RN CNS WTA Entered By: Midge Aver on 08/12/2023 11:53:50 -------------------------------------------------------------------------------- Clinic Level of Care Assessment Details Patient Name: Date of Service: [facility] [name], [name] 08/12/2023 2:15 PM Medical Record Number: [id number] Patient Account Number: [id number] Date of Birth/Sex: Treating RN: Oct 07, 1946 (77 y.o. Ginette Pitman Primary Care Daylin Gruszka: Maryelizabeth Rowan Other Clinician: Referring Yonna Alwin: Treating Rook Maue/Extender: Chauncey Mann, [name] [place] Commons in Treatment: 1 Clinic Level of Care Assessment Items TOOL 1 Quantity Score []  - 0 Use when EandM and Procedure is performed on INITIAL visit ASSESSMENTS - Nursing Assessment / Reassessment []  - 0 General Physical Exam (combine w/ comprehensive assessment (listed just  below) when performed on new pt. evals) []  - 0 Comprehensive Assessment (HX, ROS, Risk Assessments, Wounds Hx, etc.) ASSESSMENTS - Wound and Skin Assessment / Reassessment []  - 0 Dermatologic / Skin Assessment (not related to wound area) [name], [name] ([id number]) [id number] Page 2 of 15 ASSESSMENTS - Ostomy and/or Continence Assessment and Care []  - 0 Incontinence Assessment and Management []  - 0 Ostomy Care Assessment and Management (repouching, etc.) PROCESS - Coordination of Care []  - 0 Simple Patient / Family Education for ongoing care []  - 0 Complex (extensive) Patient / Family Education for ongoing care []  - 0 Staff obtains Chiropractor, Records, T Results / Process Orders est []  - 0 Staff telephones HHA, Nursing Homes / Clarify orders / etc []  - 0 Routine Transfer to another Facility (non-emergent condition) []  - 0 Routine Hospital Admission (non-emergent condition) []  - 0 New Admissions / Manufacturing engineer / Ordering NPWT Apligraf, etc. , []  - 0 Emergency Hospital Admission (emergent condition) PROCESS - Special Needs []  - 0 Pediatric / Minor Patient Management []  - 0 Isolation Patient Management []  - 0 Hearing / Language / Visual special needs []  - 0 Assessment of Community assistance (transportation, D/C planning, etc.) []  - 0 Additional assistance / Altered mentation []  - 0 Support Surface(s) Assessment (bed, cushion, seat, etc.) INTERVENTIONS - Miscellaneous []  - 0 External ear exam []  - 0 Patient Transfer (multiple staff / Nurse, adult / Similar devices) []  - 0 Simple Staple / Suture removal (25 or less) []  - 0 Complex Staple / Suture removal (26 or more) []  - 0 Hypo/Hyperglycemic Management (do not check if billed separately) []  - 0 Ankle / Brachial Index (ABI) - do not check if billed separately Has the patient been seen at the hospital within the last three years: Yes Total Score: 0 Level Of Care:  ____ Electronic Signature(s) Signed: 08/14/2023 12:30:42 PM By: Midge Aver MSN RN CNS WTA Entered By: Midge Aver on  [name], PENNINGS [name] ([id number]) [id number] Page 1 of 15 Visit Report for 08/12/2023 Arrival Information Details Patient Name: Date of Service: [facility] [name], [name] 08/12/2023 2:15 PM Medical Record Number: [id number] Patient Account Number: [id number] Date of Birth/Sex: Treating RN: 1946/04/13 (77 y.o. Ginette Pitman Primary Care Reeda Soohoo: Maryelizabeth Rowan Other Clinician: Referring Terri Malerba: Treating Sayre Witherington/Extender: Chauncey Mann, [name] [place] Commons in Treatment: 1 Visit Information History Since Last Visit Added or deleted any medications: No Patient Arrived: [name] Any new allergies or adverse reactions: No Arrival Time: 14:53 Has Dressing in Place as Prescribed: Yes Accompanied By: daughter Has Compression in Place as Prescribed: Yes Transfer Assistance: None Pain Present Now: No Patient Identification Verified: Yes Secondary Verification Process Completed: Yes Patient Requires Transmission-Based Precautions: No Patient Has Alerts: Yes Patient Alerts: diabetic Plavix Electronic Signature(s) Signed: 08/14/2023 12:30:42 PM By: Midge Aver MSN RN CNS WTA Entered By: Midge Aver on 08/12/2023 11:53:50 -------------------------------------------------------------------------------- Clinic Level of Care Assessment Details Patient Name: Date of Service: [facility] [name], [name] 08/12/2023 2:15 PM Medical Record Number: [id number] Patient Account Number: [id number] Date of Birth/Sex: Treating RN: Oct 07, 1946 (77 y.o. Ginette Pitman Primary Care Daylin Gruszka: Maryelizabeth Rowan Other Clinician: Referring Yonna Alwin: Treating Rook Maue/Extender: Chauncey Mann, [name] [place] Commons in Treatment: 1 Clinic Level of Care Assessment Items TOOL 1 Quantity Score []  - 0 Use when EandM and Procedure is performed on INITIAL visit ASSESSMENTS - Nursing Assessment / Reassessment []  - 0 General Physical Exam (combine w/ comprehensive assessment (listed just  below) when performed on new pt. evals) []  - 0 Comprehensive Assessment (HX, ROS, Risk Assessments, Wounds Hx, etc.) ASSESSMENTS - Wound and Skin Assessment / Reassessment []  - 0 Dermatologic / Skin Assessment (not related to wound area) [name], [name] ([id number]) [id number] Page 2 of 15 ASSESSMENTS - Ostomy and/or Continence Assessment and Care []  - 0 Incontinence Assessment and Management []  - 0 Ostomy Care Assessment and Management (repouching, etc.) PROCESS - Coordination of Care []  - 0 Simple Patient / Family Education for ongoing care []  - 0 Complex (extensive) Patient / Family Education for ongoing care []  - 0 Staff obtains Chiropractor, Records, T Results / Process Orders est []  - 0 Staff telephones HHA, Nursing Homes / Clarify orders / etc []  - 0 Routine Transfer to another Facility (non-emergent condition) []  - 0 Routine Hospital Admission (non-emergent condition) []  - 0 New Admissions / Manufacturing engineer / Ordering NPWT Apligraf, etc. , []  - 0 Emergency Hospital Admission (emergent condition) PROCESS - Special Needs []  - 0 Pediatric / Minor Patient Management []  - 0 Isolation Patient Management []  - 0 Hearing / Language / Visual special needs []  - 0 Assessment of Community assistance (transportation, D/C planning, etc.) []  - 0 Additional assistance / Altered mentation []  - 0 Support Surface(s) Assessment (bed, cushion, seat, etc.) INTERVENTIONS - Miscellaneous []  - 0 External ear exam []  - 0 Patient Transfer (multiple staff / Nurse, adult / Similar devices) []  - 0 Simple Staple / Suture removal (25 or less) []  - 0 Complex Staple / Suture removal (26 or more) []  - 0 Hypo/Hyperglycemic Management (do not check if billed separately) []  - 0 Ankle / Brachial Index (ABI) - do not check if billed separately Has the patient been seen at the hospital within the last three years: Yes Total Score: 0 Level Of Care:  ____ Electronic Signature(s) Signed: 08/14/2023 12:30:42 PM By: Midge Aver MSN RN CNS WTA Entered By: Midge Aver on  Date of Birth/Sex: Treating RN: 10-02-1946 (77 y.o. Ginette Pitman Primary Care Chellsea Beckers: Maryelizabeth Rowan Other Clinician: Referring Ambera Fedele: Treating Caedon Bond/Extender: Chauncey Mann, [name] [place] in Treatment: 1 Edema Assessment Assessed: [Left: No] [Right: No] [Left: Edema] [Right: :] Calf Left: Right: Point of Measurement: 35 cm From Medial Instep 35 cm 38 cm Ankle Left: Right: Point of Measurement: 12 cm From Medial Instep 20.5 cm 22 cm Knee To Floor Left: Right: From Medial Instep 38 cm 38 cm Vascular Assessment Pulses: Dorsalis Pedis Palpable: [Left:Yes] [Right:Yes] Extremity colors, hair growth, and conditions: Extremity Color: [Left:Red] [Right:Red] Hair Growth on Extremity: [Left:No] [Right:No] Temperature of Extremity: [Left:Warm] [Right:Warm] Capillary Refill: [Left:< 3  seconds] [Right:< 3 seconds] Dependent Rubor: [Left:No] [Right:No] Blanched when Elevated: [Left:No No] [Right:No No] Toe Nail Assessment Left: Right: Thick: No No Discolored: No No Deformed: No No Improper Length and Hygiene: No No Electronic Signature(s) Signed: 08/14/2023 12:30:42 PM By: Midge Aver MSN RN CNS WTA Entered By: Midge Aver on 08/12/2023 12:10:55 [name] ([id number]) [id number] Page 6 of 15 -------------------------------------------------------------------------------- Multi Wound Chart Details Patient Name: Date of Service: [facility] [name], [name] 08/12/2023 2:15 PM Medical Record Number: [id number] Patient Account Number: [id number] Date of Birth/Sex: Treating RN: 12/29/1945 (77 y.o. Ginette Pitman Primary Care Jatoria Kneeland: Maryelizabeth Rowan Other Clinician: Referring Yordi Krager: Treating Georgene Kopper/Extender: Chauncey Mann, [name] [place] in Treatment: 1 Vital Signs Height(in): 60 Pulse(bpm): 45 Weight(lbs): 155 Blood Pressure(mmHg): 188/53 Body Mass Index(BMI): 30.3 Temperature([name]): 97.7 Respiratory Rate(breaths/min): 18 [1:Photos:] Right, Posterior T Great oe Right, Medial Lower Leg Left, Posterior Lower Leg Wound Location: Gradually Appeared Gradually Appeared Gradually Appeared Wounding Event: Diabetic Wound/Ulcer of the Lower Diabetic Wound/Ulcer of the Lower Diabetic Wound/Ulcer of the Lower Primary Etiology: Extremity Extremity Extremity Cataracts, Anemia, Chronic Cataracts, Anemia, Chronic Cataracts, Anemia, Chronic Comorbid History: Obstructive Pulmonary Disease Obstructive Pulmonary Disease Obstructive Pulmonary Disease (COPD), Peripheral Arterial Disease, (COPD), Peripheral Arterial Disease, (COPD), Peripheral Arterial Disease, Type II Diabetes Type II Diabetes Type II Diabetes 08/05/2023 08/05/2023 08/05/2023 Date Acquired: 1 1 1  Weeks of Treatment: Open Open Open Wound Status: No No No Wound  Recurrence: 0.1x0.1x0.1 0.1x0.1x0.1 0.1x0.1x0.1 Measurements L x W x D (cm) 0.008 0.008 0.008 A (cm) : rea 0.001 0.001 0.001 Volume (cm) : 74.20% 95.90% 97.90% % Reduction in A rea: 66.70% 95.00% 97.40% % Reduction in Volume: Grade 1 Grade 2 Grade 1 Classification: None Present Medium Medium Exudate A mount: N/A Serosanguineous Serosanguineous Exudate Type: N/A red, brown red, brown Exudate Color: None Present (0%) Medium (34-66%) Medium (34-66%) Granulation A mount: N/A Red, Pink Pale Granulation Quality: None Present (0%) None Present (0%) None Present (0%) Necrotic A mount: Fascia: No Fascia: No Fascia: No Exposed Structures: Fat Layer (Subcutaneous Tissue): No Fat Layer (Subcutaneous Tissue): No Fat Layer (Subcutaneous Tissue): No Tendon: No Tendon: No Tendon: No Muscle: No Muscle: No Muscle: No Joint: No Joint: No Joint: No Bone: No Bone: No Bone: No None Medium (34-66%) Small (1-33%) Epithelialization: Wound Number: 4 N/A N/A Photos: N/A N/A [name] ([id number]) [id number] Page 7 of 15 Left, Lateral Lower Leg N/A N/A Wound Location: Gradually Appeared N/A N/A Wounding Event: Diabetic Wound/Ulcer of the Lower N/A N/A Primary Etiology: Extremity Cataracts, Anemia, Chronic N/A N/A Comorbid History: Obstructive Pulmonary Disease (COPD), Peripheral Arterial Disease, Type II Diabetes 08/05/2023 N/A N/A Date Acquired: 1 N/A N/A Weeks of Treatment: Open N/A N/A Wound Status: No N/A N/A Wound Recurrence: 0.1x0.1x0.1 N/A N/A Measurements  y.o. Ginette Pitman Primary Care Loukas Antonson: Maryelizabeth Rowan Other Clinician: Referring Abree Romick: Treating Anthany Thornhill/Extender: Chauncey Mann, [name] [place] Commons in Treatment: 1 Wound Status Wound Number: 1 Primary Diabetic Wound/Ulcer of the Lower Extremity Etiology: Wound Location: Right, Posterior T Great oe Wound Open Wounding Event: Gradually Appeared Status: Date Acquired: 08/05/2023 Comorbid Cataracts, Anemia, Chronic Obstructive Pulmonary Disease Weeks Of Treatment: 1 History: (COPD), Peripheral Arterial Disease, Type II Diabetes Clustered Wound: No [name], QUINBY ([id number]) [id number] Page 10 of 15 Photos Wound Measurements Length: (cm) 0.1 Width: (cm) 0.1 Depth: (cm) 0.1 Area: (cm) 0.008 Volume: (cm) 0.001 % Reduction in Area: 74.2% % Reduction in Volume: 66.7% Epithelialization: None Wound Description Classification: Grade 1 Exudate Amount: None Present Foul Odor After Cleansing: No Slough/Fibrino No Wound Bed Granulation Amount: None Present (0%) Exposed Structure Necrotic Amount: None Present (0%) Fascia Exposed: No Fat Layer (Subcutaneous Tissue) Exposed: No Tendon Exposed: No Muscle Exposed: No Joint Exposed: No Bone Exposed: No Treatment Notes Wound #1 (Toe Great) Wound Laterality: Right, Posterior Cleanser Soap and Water Discharge Instruction: Gently cleanse wound with antibacterial soap, rinse and pat dry prior to dressing wounds Peri-Wound Care Topical Triamcinolone Acetonide Cream, 0.1%, 15 (g) tube Discharge Instruction: Apply as directed by Gazelle Towe. Primary Dressing Secondary  Dressing Secured With Compression Wrap Urgo K2 Lite, two layer compression system, regular Compression Stockings Add-Ons Electronic Signature(s) Signed: 08/14/2023 12:30:42 PM By: Midge Aver MSN RN CNS WTA Entered By: Midge Aver on 08/12/2023 12:07:19 [name] ([id number]) [id number] Page 11 of 15 -------------------------------------------------------------------------------- Wound Assessment Details Patient Name: Date of Service: [facility] [name], [name] 08/12/2023 2:15 PM Medical Record Number: [id number] Patient Account Number: [id number] Date of Birth/Sex: Treating RN: Aug 18, 1946 (77 y.o. Ginette Pitman Primary Care Leelah Hanna: Maryelizabeth Rowan Other Clinician: Referring Shanera Meske: Treating Serenitee Fuertes/Extender: Chauncey Mann, [name] [place] Commons in Treatment: 1 Wound Status Wound Number: 2 Primary Diabetic Wound/Ulcer of the Lower Extremity Etiology: Wound Location: Right, Medial Lower Leg Wound Open Wounding Event: Gradually Appeared Status: Date Acquired: 08/05/2023 Comorbid Cataracts, Anemia, Chronic Obstructive Pulmonary Disease Weeks Of Treatment: 1 History: (COPD), Peripheral Arterial Disease, Type II Diabetes Clustered Wound: No Photos Wound Measurements Length: (cm) 0.1 Width: (cm) 0.1 Depth: (cm) 0.1 Area: (cm) 0.008 Volume: (cm) 0.001 % Reduction in Area: 95.9% % Reduction in Volume: 95% Epithelialization: Medium (34-66%) Wound Description Classification: Grade 2 Exudate Amount: Medium Exudate Type: Serosanguineous Exudate Color: red, brown Foul Odor After Cleansing: No Slough/Fibrino No Wound Bed Granulation Amount: Medium (34-66%) Exposed Structure Granulation Quality: Red, Pink Fascia Exposed: No Necrotic Amount: None Present (0%) Fat Layer (Subcutaneous Tissue) Exposed: No Tendon Exposed: No Muscle Exposed: No Joint Exposed: No Bone Exposed: No Treatment Notes Wound #2 (Lower Leg) Wound Laterality:  Right, Medial Cleanser Soap and Water Discharge Instruction: Gently cleanse wound with antibacterial soap, rinse and pat dry prior to dressing wounds Peri-Wound Care [name], [name] ([id number]) [id number] Page 12 of 15 Topical Triamcinolone Acetonide Cream, 0.1%, 15 (g) tube Discharge Instruction: Apply as directed by Zyria Fiscus. Primary Dressing Secondary Dressing Secured With Compression Wrap Urgo K2 Lite, two layer compression system, regular Compression Stockings Add-Ons Electronic Signature(s) Signed: 08/14/2023 12:30:42 PM By: Midge Aver MSN RN CNS WTA Entered By: Midge Aver on 08/12/2023 12:07:56 -------------------------------------------------------------------------------- Wound Assessment Details Patient Name: Date of Service: [name], [name] TRICE [name]. 08/12/2023 2:15 PM Medical Record Number: [id number] Patient Account Number: [id number] Date of Birth/Sex: Treating RN: March 15, 1946 (77 y.o. Ginette Pitman Primary Care  Date of Birth/Sex: Treating RN: 10-02-1946 (77 y.o. Ginette Pitman Primary Care Chellsea Beckers: Maryelizabeth Rowan Other Clinician: Referring Ambera Fedele: Treating Caedon Bond/Extender: Chauncey Mann, [name] [place] Commons in Treatment: 1 Edema Assessment Assessed: [Left: No] [Right: No] [Left: Edema] [Right: :] Calf Left: Right: Point of Measurement: 35 cm From Medial Instep 35 cm 38 cm Ankle Left: Right: Point of Measurement: 12 cm From Medial Instep 20.5 cm 22 cm Knee To Floor Left: Right: From Medial Instep 38 cm 38 cm Vascular Assessment Pulses: Dorsalis Pedis Palpable: [Left:Yes] [Right:Yes] Extremity colors, hair growth, and conditions: Extremity Color: [Left:Red] [Right:Red] Hair Growth on Extremity: [Left:No] [Right:No] Temperature of Extremity: [Left:Warm] [Right:Warm] Capillary Refill: [Left:< 3  seconds] [Right:< 3 seconds] Dependent Rubor: [Left:No] [Right:No] Blanched when Elevated: [Left:No No] [Right:No No] Toe Nail Assessment Left: Right: Thick: No No Discolored: No No Deformed: No No Improper Length and Hygiene: No No Electronic Signature(s) Signed: 08/14/2023 12:30:42 PM By: Midge Aver MSN RN CNS WTA Entered By: Midge Aver on 08/12/2023 12:10:55 [name] ([id number]) [id number] Page 6 of 15 -------------------------------------------------------------------------------- Multi Wound Chart Details Patient Name: Date of Service: [facility] [name], [name] 08/12/2023 2:15 PM Medical Record Number: [id number] Patient Account Number: [id number] Date of Birth/Sex: Treating RN: 12/29/1945 (77 y.o. Ginette Pitman Primary Care Jatoria Kneeland: Maryelizabeth Rowan Other Clinician: Referring Yordi Krager: Treating Georgene Kopper/Extender: Chauncey Mann, [name] [place] Commons in Treatment: 1 Vital Signs Height(in): 60 Pulse(bpm): 45 Weight(lbs): 155 Blood Pressure(mmHg): 188/53 Body Mass Index(BMI): 30.3 Temperature([name]): 97.7 Respiratory Rate(breaths/min): 18 [1:Photos:] Right, Posterior T Great oe Right, Medial Lower Leg Left, Posterior Lower Leg Wound Location: Gradually Appeared Gradually Appeared Gradually Appeared Wounding Event: Diabetic Wound/Ulcer of the Lower Diabetic Wound/Ulcer of the Lower Diabetic Wound/Ulcer of the Lower Primary Etiology: Extremity Extremity Extremity Cataracts, Anemia, Chronic Cataracts, Anemia, Chronic Cataracts, Anemia, Chronic Comorbid History: Obstructive Pulmonary Disease Obstructive Pulmonary Disease Obstructive Pulmonary Disease (COPD), Peripheral Arterial Disease, (COPD), Peripheral Arterial Disease, (COPD), Peripheral Arterial Disease, Type II Diabetes Type II Diabetes Type II Diabetes 08/05/2023 08/05/2023 08/05/2023 Date Acquired: 1 1 1  Weeks of Treatment: Open Open Open Wound Status: No No No Wound  Recurrence: 0.1x0.1x0.1 0.1x0.1x0.1 0.1x0.1x0.1 Measurements L x W x D (cm) 0.008 0.008 0.008 A (cm) : rea 0.001 0.001 0.001 Volume (cm) : 74.20% 95.90% 97.90% % Reduction in A rea: 66.70% 95.00% 97.40% % Reduction in Volume: Grade 1 Grade 2 Grade 1 Classification: None Present Medium Medium Exudate A mount: N/A Serosanguineous Serosanguineous Exudate Type: N/A red, brown red, brown Exudate Color: None Present (0%) Medium (34-66%) Medium (34-66%) Granulation A mount: N/A Red, Pink Pale Granulation Quality: None Present (0%) None Present (0%) None Present (0%) Necrotic A mount: Fascia: No Fascia: No Fascia: No Exposed Structures: Fat Layer (Subcutaneous Tissue): No Fat Layer (Subcutaneous Tissue): No Fat Layer (Subcutaneous Tissue): No Tendon: No Tendon: No Tendon: No Muscle: No Muscle: No Muscle: No Joint: No Joint: No Joint: No Bone: No Bone: No Bone: No None Medium (34-66%) Small (1-33%) Epithelialization: Wound Number: 4 N/A N/A Photos: N/A N/A [name] ([id number]) [id number] Page 7 of 15 Left, Lateral Lower Leg N/A N/A Wound Location: Gradually Appeared N/A N/A Wounding Event: Diabetic Wound/Ulcer of the Lower N/A N/A Primary Etiology: Extremity Cataracts, Anemia, Chronic N/A N/A Comorbid History: Obstructive Pulmonary Disease (COPD), Peripheral Arterial Disease, Type II Diabetes 08/05/2023 N/A N/A Date Acquired: 1 N/A N/A Weeks of Treatment: Open N/A N/A Wound Status: No N/A N/A Wound Recurrence: 0.1x0.1x0.1 N/A N/A Measurements  Date of Birth/Sex: Treating RN: 10-02-1946 (77 y.o. Ginette Pitman Primary Care Chellsea Beckers: Maryelizabeth Rowan Other Clinician: Referring Ambera Fedele: Treating Caedon Bond/Extender: Chauncey Mann, [name] [place] Commons in Treatment: 1 Edema Assessment Assessed: [Left: No] [Right: No] [Left: Edema] [Right: :] Calf Left: Right: Point of Measurement: 35 cm From Medial Instep 35 cm 38 cm Ankle Left: Right: Point of Measurement: 12 cm From Medial Instep 20.5 cm 22 cm Knee To Floor Left: Right: From Medial Instep 38 cm 38 cm Vascular Assessment Pulses: Dorsalis Pedis Palpable: [Left:Yes] [Right:Yes] Extremity colors, hair growth, and conditions: Extremity Color: [Left:Red] [Right:Red] Hair Growth on Extremity: [Left:No] [Right:No] Temperature of Extremity: [Left:Warm] [Right:Warm] Capillary Refill: [Left:< 3  seconds] [Right:< 3 seconds] Dependent Rubor: [Left:No] [Right:No] Blanched when Elevated: [Left:No No] [Right:No No] Toe Nail Assessment Left: Right: Thick: No No Discolored: No No Deformed: No No Improper Length and Hygiene: No No Electronic Signature(s) Signed: 08/14/2023 12:30:42 PM By: Midge Aver MSN RN CNS WTA Entered By: Midge Aver on 08/12/2023 12:10:55 [name] ([id number]) [id number] Page 6 of 15 -------------------------------------------------------------------------------- Multi Wound Chart Details Patient Name: Date of Service: [facility] [name], [name] 08/12/2023 2:15 PM Medical Record Number: [id number] Patient Account Number: [id number] Date of Birth/Sex: Treating RN: 12/29/1945 (77 y.o. Ginette Pitman Primary Care Jatoria Kneeland: Maryelizabeth Rowan Other Clinician: Referring Yordi Krager: Treating Georgene Kopper/Extender: Chauncey Mann, [name] [place] Commons in Treatment: 1 Vital Signs Height(in): 60 Pulse(bpm): 45 Weight(lbs): 155 Blood Pressure(mmHg): 188/53 Body Mass Index(BMI): 30.3 Temperature([name]): 97.7 Respiratory Rate(breaths/min): 18 [1:Photos:] Right, Posterior T Great oe Right, Medial Lower Leg Left, Posterior Lower Leg Wound Location: Gradually Appeared Gradually Appeared Gradually Appeared Wounding Event: Diabetic Wound/Ulcer of the Lower Diabetic Wound/Ulcer of the Lower Diabetic Wound/Ulcer of the Lower Primary Etiology: Extremity Extremity Extremity Cataracts, Anemia, Chronic Cataracts, Anemia, Chronic Cataracts, Anemia, Chronic Comorbid History: Obstructive Pulmonary Disease Obstructive Pulmonary Disease Obstructive Pulmonary Disease (COPD), Peripheral Arterial Disease, (COPD), Peripheral Arterial Disease, (COPD), Peripheral Arterial Disease, Type II Diabetes Type II Diabetes Type II Diabetes 08/05/2023 08/05/2023 08/05/2023 Date Acquired: 1 1 1  Weeks of Treatment: Open Open Open Wound Status: No No No Wound  Recurrence: 0.1x0.1x0.1 0.1x0.1x0.1 0.1x0.1x0.1 Measurements L x W x D (cm) 0.008 0.008 0.008 A (cm) : rea 0.001 0.001 0.001 Volume (cm) : 74.20% 95.90% 97.90% % Reduction in A rea: 66.70% 95.00% 97.40% % Reduction in Volume: Grade 1 Grade 2 Grade 1 Classification: None Present Medium Medium Exudate A mount: N/A Serosanguineous Serosanguineous Exudate Type: N/A red, brown red, brown Exudate Color: None Present (0%) Medium (34-66%) Medium (34-66%) Granulation A mount: N/A Red, Pink Pale Granulation Quality: None Present (0%) None Present (0%) None Present (0%) Necrotic A mount: Fascia: No Fascia: No Fascia: No Exposed Structures: Fat Layer (Subcutaneous Tissue): No Fat Layer (Subcutaneous Tissue): No Fat Layer (Subcutaneous Tissue): No Tendon: No Tendon: No Tendon: No Muscle: No Muscle: No Muscle: No Joint: No Joint: No Joint: No Bone: No Bone: No Bone: No None Medium (34-66%) Small (1-33%) Epithelialization: Wound Number: 4 N/A N/A Photos: N/A N/A [name] ([id number]) [id number] Page 7 of 15 Left, Lateral Lower Leg N/A N/A Wound Location: Gradually Appeared N/A N/A Wounding Event: Diabetic Wound/Ulcer of the Lower N/A N/A Primary Etiology: Extremity Cataracts, Anemia, Chronic N/A N/A Comorbid History: Obstructive Pulmonary Disease (COPD), Peripheral Arterial Disease, Type II Diabetes 08/05/2023 N/A N/A Date Acquired: 1 N/A N/A Weeks of Treatment: Open N/A N/A Wound Status: No N/A N/A Wound Recurrence: 0.1x0.1x0.1 N/A N/A Measurements  [name]: Maryelizabeth Rowan Other Clinician: Referring Kylene Zamarron: Treating [name]/Extender: Chauncey Mann, [name] [place] [facility] in Treatment: 1 Wound Status Wound Number: 3 Primary Diabetic Wound/Ulcer of the Lower Extremity Etiology: Wound Location: Left, Posterior Lower Leg Wound Open Wounding Event: Gradually Appeared Status: Date Acquired: 08/05/2023 Comorbid Cataracts, Anemia, Chronic Obstructive Pulmonary Disease Weeks Of Treatment: 1 History: (COPD), Peripheral Arterial Disease, Type II Diabetes Clustered Wound: No Photos Wound Measurements Length: (cm) 0.1 Width: (cm) 0.1 Depth: (cm) 0.1 Area: (cm) 0.008 Volume: (cm) 0.001 % Reduction in Area: 97.9% % Reduction in Volume: 97.4% Epithelialization: Small (1-33%) Wound Description Classification: Grade 1 Exudate Amount: Medium [name], [name] ([id number]) Exudate Amount: Medium Exudate Type: Serosanguineous Exudate Color: red, brown Foul Odor After  Cleansing: No Slough/Fibrino No [id number] Page 13 of 15 Slough/Fibrino No Wound Bed Granulation Amount: Medium (34-66%) Exposed Structure Granulation Quality: Pale Fascia Exposed: No Necrotic Amount: None Present (0%) Fat Layer (Subcutaneous Tissue) Exposed: No Tendon Exposed: No Muscle Exposed: No Joint Exposed: No Bone Exposed: No Treatment Notes Wound #3 (Lower Leg) Wound Laterality: Left, Posterior Cleanser Soap and Water Discharge Instruction: Gently cleanse wound with antibacterial soap, rinse and pat dry prior to dressing wounds Peri-Wound Care Topical Triamcinolone Acetonide Cream, 0.1%, 15 (g) tube Discharge Instruction: Apply as directed by [name]. Primary Dressing Secondary Dressing Secured With Compression Wrap Urgo K2 Lite, two layer compression system, regular Compression Stockings Add-Ons Electronic Signature(s) Signed: 08/14/2023 12:30:42 PM By: Midge Aver MSN RN CNS WTA Entered By: Midge Aver on 08/12/2023 12:08:23 -------------------------------------------------------------------------------- Wound Assessment Details Patient Name: Date of Service: [name], [name] TRICE [name]. 08/12/2023 2:15 PM Medical Record Number: [id number] Patient Account Number: [id number] Date of Birth/Sex: Treating RN: [birth year] (77 y.o. Ginette Pitman Primary Care [name]: Maryelizabeth Rowan Other Clinician: Referring [name]: Treating [name]/Extender: Chauncey Mann, [name] [place] [facility] in Treatment: 1 Wound Status Wound Number: 4 Primary Diabetic Wound/Ulcer of the Lower Extremity Etiology: Wound Location: Left, Lateral Lower Leg Wound Open Wounding Event: Gradually Appeared Status: Date Acquired: 08/05/2023 Comorbid Cataracts, Anemia, Chronic Obstructive Pulmonary Disease Weeks Of Treatment: 1 History: (COPD), Peripheral Arterial Disease, Type II Diabetes Clustered Wound: No Photos DANEL, STUDZINSKI [name] ([id number])  [id number] Page 14 of 15 Wound Measurements Length: (cm) 0.1 Width: (cm) 0.1 Depth: (cm) 0.1 Area: (cm) 0.008 Volume: (cm) 0.001 % Reduction in Area: 98.5% % Reduction in Volume: 98.1% Epithelialization: Medium (34-66%) Wound Description Classification: Grade 2 Exudate Amount: Medium Exudate Type: Serosanguineous Exudate Color: red, brown Foul Odor After Cleansing: No Slough/Fibrino No Wound Bed Granulation Amount: Medium (34-66%) Exposed Structure Granulation Quality: Red, Pink Fascia Exposed: No Necrotic Amount: None Present (0%) Fat Layer (Subcutaneous Tissue) Exposed: No Tendon Exposed: No Muscle Exposed: No Joint Exposed: No Bone Exposed: No Treatment Notes Wound #4 (Lower Leg) Wound Laterality: Left, Lateral Cleanser Soap and Water Discharge Instruction: Gently cleanse wound with antibacterial soap, rinse and pat dry prior to dressing wounds Peri-Wound Care Topical Triamcinolone Acetonide Cream, 0.1%, 15 (g) tube Discharge Instruction: Apply as directed by Evalene Vath. Primary Dressing Secondary Dressing Secured With Compression Wrap Urgo K2 Lite, two layer compression system, regular Compression Stockings Add-Ons Electronic Signature(s) Signed: 08/14/2023 12:30:42 PM By: Midge Aver MSN RN CNS WTA Entered By: Midge Aver on 08/12/2023 12:08:45 [name] ([id number]) [id number] Page 15 of 15 -------------------------------------------------------------------------------- Vitals Details Patient Name: Date of Service: [facility] [name], [name] 08/12/2023 2:15 PM Medical Record Number: [id number] Patient Account Number: [id number] Date of Birth/Sex: Treating RN: [birth year] [phone]

## 2023-08-19 ENCOUNTER — Encounter: Payer: Medicare HMO | Attending: Internal Medicine | Admitting: Internal Medicine

## 2023-08-19 DIAGNOSIS — J449 Chronic obstructive pulmonary disease, unspecified: Secondary | ICD-10-CM | POA: Insufficient documentation

## 2023-08-19 DIAGNOSIS — E1142 Type 2 diabetes mellitus with diabetic polyneuropathy: Secondary | ICD-10-CM | POA: Insufficient documentation

## 2023-08-19 DIAGNOSIS — Z872 Personal history of diseases of the skin and subcutaneous tissue: Secondary | ICD-10-CM | POA: Insufficient documentation

## 2023-08-19 DIAGNOSIS — Z09 Encounter for follow-up examination after completed treatment for conditions other than malignant neoplasm: Secondary | ICD-10-CM | POA: Diagnosis present

## 2023-08-19 DIAGNOSIS — I87323 Chronic venous hypertension (idiopathic) with inflammation of bilateral lower extremity: Secondary | ICD-10-CM | POA: Diagnosis not present

## 2023-08-19 DIAGNOSIS — Z933 Colostomy status: Secondary | ICD-10-CM | POA: Insufficient documentation

## 2023-08-19 DIAGNOSIS — E1151 Type 2 diabetes mellitus with diabetic peripheral angiopathy without gangrene: Secondary | ICD-10-CM | POA: Diagnosis not present

## 2023-08-19 DIAGNOSIS — Z85048 Personal history of other malignant neoplasm of rectum, rectosigmoid junction, and anus: Secondary | ICD-10-CM | POA: Diagnosis not present

## 2023-08-19 DIAGNOSIS — Z8541 Personal history of malignant neoplasm of cervix uteri: Secondary | ICD-10-CM | POA: Insufficient documentation

## 2023-08-19 NOTE — Progress Notes (Addendum)
NAVIA, [name] ([id number]) [id number] Page 1 of 5 Visit Report for 08/19/2023 HPI Details Patient Name: Date of Service: [facility] [name], [name] 08/19/2023 2:15 PM Medical Record Number: [id number] Patient Account Number: [id number] Date of Birth/Sex: Treating RN: [birth year] (77 y.o. [name] Primary Care Provider: [name] Other Clinician: Referring Provider: Treating Provider/Extender: [name], [name] [name] in Treatment: 2 History of Present Illness HPI Description: ADMISSION 08/05/2023 This is a 77 year old woman I think who is were suggested to come to our clinic by their primary care office with regards to weeping edema of the right greater than left lower leg. I have reviewed a note from Dr. Mancel Bale about her underlying medical issues which include a remote history of cervical cancer that recurred and a history of rectal cancer more recently. She is also a type II diabetic with peripheral neuropathy, COPD has a colostomy and has PAD. Apparently over the last several months she has developed small weeping areas on her anterior and posterior lower legs in the setting of increasing edema in the right greater than the left leg. I note that she has been fairly aggressively worked up for this through Dr. Kalman Drape staff. This is included several DVT rule outs which were negative most recently in June 2024. She also had a CT scan of the abdomen and pelvis presumably looking for central venous compression or obstruction and that was negative also in June. Nevertheless her daughter says that the edema in her right leg has been increasing and with the increase in edema edema there is weeping fluid. Past medical history is as listed above. ABIs in our clinic were 0.69 on the right and 0.75 on the left. 9/25; patient returns today most of the major weeping areas of close there is still bilateral small eschared areas however  everything looks a lot better. She is going to need compression stockings. 10/2; all of the patient's weeping areas of closed. She has bilateral juxta lite stockings. She still has the right greater than left edema extending up into her upper thigh. This was previously worked up by her oncologist in June without an easy explanation. There was no evidence of a DVT or central venous obstruction Electronic Signature(s) Signed: 08/19/2023 3:47:50 PM By: Baltazar Najjar MD Entered By: Baltazar Najjar on 08/19/2023 15:15:19 -------------------------------------------------------------------------------- Physical Exam Details Patient Name: Date of Service: [name], [name]. 08/19/2023 2:15 PM Medical Record Number: [id number] Patient Account Number: [id number] Date of Birth/Sex: Treating RN: 1946/10/12 (77 y.o. [name] Primary Care Provider: [name] Other Clinician: Referring Provider: Treating Provider/Extender: [name], [name] [name] in Treatment: 2 [name] ([id number]) [id number] Page 2 of 5 Constitutional Patient is hypertensive.. Pulse regular and within target range for patient.Marland Kitchen Respirations regular, non-labored and within target range.. Temperature is normal and within the target range for the patient.Marland Kitchen appears in no distress. Notes Wound exam; there is no open wound. T ense edema of the right leg well up into her thigh as previously described. There is no weeping edema fluid. She has some fluid on the right knee but this looks like osteoarthritis nothing looks inflammatory. Electronic Signature(s) Signed: 08/19/2023 3:47:50 PM By: Baltazar Najjar MD Entered By: Baltazar Najjar on 08/19/2023 15:16:43 -------------------------------------------------------------------------------- Physician Orders Details Patient Name: Date of Service: [name], [name]. 08/19/2023 2:15 PM Medical Record Number:  [id number] Patient Account Number: [id number] Date of Birth/Sex: Treating RN: 09-22-1946 (77 y.o.  [name] Primary Care Provider: [name] Other Clinician: Referring Provider: Treating Provider/Extender: [name], [name] [name] in Treatment: 2 Verbal / Phone Orders: No Diagnosis Coding ICD-10 Coding Code Description [phone] Chronic venous hypertension (idiopathic) with inflammation of bilateral lower extremity L97.818 Non-pressure chronic ulcer of other part of right lower leg with other specified severity L97.821 Non-pressure chronic ulcer of other part of left lower leg limited to breakdown of skin Discharge From [facility] Discharge from [facility] Treatment Complete Wear compression garments daily. Put garments on first thing when you wake up and remove them before bed. - May remove Juxta Lite at night and put back on in the morning Electronic Signature(s) Signed: 08/24/2023 2:38:30 PM By: Midge Aver MSN RN CNS WTA Signed: 08/27/2023 4:27:05 PM By: Baltazar Najjar MD Entered By: Midge Aver on 08/24/2023 14:38:30 -------------------------------------------------------------------------------- Problem List Details Patient Name: Date of Service: [name], [name]. 08/19/2023 2:15 PM Medical Record Number: [id number] Patient Account Number: [id number] Date of Birth/Sex: Treating RN: Sep 13, 1946 (77 y.o. [name] Primary Care Provider: [name] Other Clinician: [name] ([id number]) [id number] Page 3 of 5 Referring Provider: Treating Provider/Extender: RO BSO N, [name] [name] in Treatment: 2 Active Problems ICD-10 Encounter Code Description Active Date MDM Diagnosis I87.323 Chronic venous hypertension (idiopathic) with inflammation of bilateral lower 08/05/2023 No Yes extremity L97.818 Non-pressure chronic ulcer of other part of right lower leg with other  specified 08/05/2023 No Yes severity L97.821 Non-pressure chronic ulcer of other part of left lower leg limited to breakdown 08/05/2023 No Yes of skin Inactive Problems Resolved Problems Electronic Signature(s) Signed: 08/19/2023 3:47:50 PM By: Baltazar Najjar MD Entered By: Baltazar Najjar on 08/19/2023 15:12:57 -------------------------------------------------------------------------------- Progress Note Details Patient Name: Date of Service: [name], [name]. 08/19/2023 2:15 PM Medical Record Number: [id number] Patient Account Number: [id number] Date of Birth/Sex: Treating RN: April 24, 1946 (77 y.o. [name] Primary Care Provider: [name] Other Clinician: Referring Provider: Treating Provider/Extender: [name], [name] [name] in Treatment: 2 Subjective History of Present Illness (HPI) ADMISSION 08/05/2023 This is a 77 year old woman I think who is were suggested to come to our clinic by their primary care office with regards to weeping edema of the right greater than left lower leg. I have reviewed a note from Dr. Mancel Bale about her underlying medical issues which include a remote history of cervical cancer that recurred and a history of rectal cancer more recently. She is also a type II diabetic with peripheral neuropathy, COPD has a colostomy and has PAD. Apparently over the last several months she has developed small weeping areas on her anterior and posterior lower legs in the setting of increasing edema in the right greater than the left leg. I note that she has been fairly aggressively worked up for this through Dr. Kalman Drape staff. This is included several DVT rule outs which were negative most recently in June 2024. She also had a CT scan of the abdomen and pelvis presumably looking for central venous compression or obstruction and that was negative also in June. Nevertheless her daughter says that the edema in her right leg has been  increasing and with the increase in edema edema there is weeping fluid. Past medical history is as listed above. ABIs in our clinic were 0.69 on the right and 0.75 on the left. 9/25; patient returns today most of the major weeping areas of close  the right lower extremity edema was never really determined appropriate test done in June were negative. 4. Advised to keep her legs elevated when she is sitting for prolonged periods Electronic Signature(s) Signed: 08/19/2023 3:47:50 PM By: Baltazar Najjar MD Entered By: Baltazar Najjar on 08/19/2023 15:18:28 [name] ([id number]) [id number] Page 5 of 5 -------------------------------------------------------------------------------- SuperBill Details Patient Name: Date of Service: [name], [name] 08/19/2023 Medical Record Number: [id number] Patient Account Number: [id number] Date of Birth/Sex: Treating RN: 05/02/1946 (77 y.o. [name] Primary Care Provider: [name] Other Clinician: Referring Provider: Treating Provider/Extender: [name], [name] [name] in Treatment: 2 Diagnosis Coding ICD-10 Codes Code Description [phone] Chronic venous hypertension (idiopathic) with inflammation of bilateral lower extremity L97.818 Non-pressure chronic ulcer of other part of right lower leg with other specified severity L97.821 Non-pressure chronic ulcer of other part of left lower leg limited to breakdown of skin Facility Procedures : CPT4 Code: [id number] Description: [id number] - WOUND CARE VISIT-LEV 2 EST PT Modifier: Quantity: 1 Physician Procedures : CPT4 Code Description Modifier [id number] - WC PHYS LEVEL 2 - EST PT ICD-10 Diagnosis Description I87.323 Chronic venous hypertension (idiopathic) with inflammation of bilateral lower extremity L97.818 Non-pressure chronic ulcer of other part of  right lower leg with other specified severity L97.821 Non-pressure chronic ulcer of other part of left lower leg limited to breakdown of skin Quantity: 1 Electronic Signature(s) Signed: 08/24/2023 2:39:17 PM By: Midge Aver MSN RN CNS WTA Signed: 08/27/2023 4:27:05 PM By: Baltazar Najjar MD Previous Signature: 08/19/2023 3:47:50 PM Version By: Baltazar Najjar MD Entered By: Midge Aver on 08/24/2023 14:39:17  [name] Primary Care Provider: [name] Other Clinician: Referring Provider: Treating Provider/Extender: [name], [name] [name] in Treatment: 2 Verbal / Phone Orders: No Diagnosis Coding ICD-10 Coding Code Description [phone] Chronic venous hypertension (idiopathic) with inflammation of bilateral lower extremity L97.818 Non-pressure chronic ulcer of other part of right lower leg with other specified severity L97.821 Non-pressure chronic ulcer of other part of left lower leg limited to breakdown of skin Discharge From [facility] Discharge from W[facility] Care Center Treatment Complete Wear compression garments daily. Put garments on first thing when you wake up and remove them before bed. - May remove Juxta Lite at night and put back on in the morning Electronic Signature(s) Signed: 08/24/2023 2:38:30 PM By: Midge Aver MSN RN CNS WTA Signed: 08/27/2023 4:27:05 PM By: Baltazar Najjar MD Entered By: Midge Aver on 08/24/2023 14:38:30 -------------------------------------------------------------------------------- Problem List Details Patient Name: Date of Service: [name], [name]. 08/19/2023 2:15 PM Medical Record Number: [id number] Patient Account Number: [id number] Date of Birth/Sex: Treating RN: Sep 13, 1946 (77 y.o. [name] Primary Care Provider: [name] Other Clinician: [name] ([id number]) [id number] Page 3 of 5 Referring Provider: Treating Provider/Extender: RO BSO N, [name] [name] in Treatment: 2 Active Problems ICD-10 Encounter Code Description Active Date MDM Diagnosis I87.323 Chronic venous hypertension (idiopathic) with inflammation of bilateral lower 08/05/2023 No Yes extremity L97.818 Non-pressure chronic ulcer of other part of right lower leg with other  specified 08/05/2023 No Yes severity L97.821 Non-pressure chronic ulcer of other part of left lower leg limited to breakdown 08/05/2023 No Yes of skin Inactive Problems Resolved Problems Electronic Signature(s) Signed: 08/19/2023 3:47:50 PM By: Baltazar Najjar MD Entered By: Baltazar Najjar on 08/19/2023 15:12:57 -------------------------------------------------------------------------------- Progress Note Details Patient Name: Date of Service: [name], [name]. 08/19/2023 2:15 PM Medical Record Number: [id number] Patient Account Number: [id number] Date of Birth/Sex: Treating RN: April 24, 1946 (77 y.o. [name] Primary Care Provider: [name] Other Clinician: Referring Provider: Treating Provider/Extender: [name], [name] [name] in Treatment: 2 Subjective History of Present Illness (HPI) ADMISSION 08/05/2023 This is a 77 year old woman I think who is were suggested to come to our clinic by their primary care office with regards to weeping edema of the right greater than left lower leg. I have reviewed a note from Dr. Mancel Bale about her underlying medical issues which include a remote history of cervical cancer that recurred and a history of rectal cancer more recently. She is also a type II diabetic with peripheral neuropathy, COPD has a colostomy and has PAD. Apparently over the last several months she has developed small weeping areas on her anterior and posterior lower legs in the setting of increasing edema in the right greater than the left leg. I note that she has been fairly aggressively worked up for this through Dr. Kalman Drape staff. This is included several DVT rule outs which were negative most recently in June 2024. She also had a CT scan of the abdomen and pelvis presumably looking for central venous compression or obstruction and that was negative also in June. Nevertheless her daughter says that the edema in her right leg has been  increasing and with the increase in edema edema there is weeping fluid. Past medical history is as listed above. ABIs in our clinic were 0.69 on the right and 0.75 on the left. 9/25; patient returns today most of the major weeping areas of close

## 2023-08-21 NOTE — Progress Notes (Signed)
66.70% 95.00% 97.40% % Reduction in Volume: Grade 1 Grade 2 Grade 1 Classification: None Present Medium Medium Exudate A mount: N/A Serosanguineous Serosanguineous Exudate Type: N/A red, brown red, brown Exudate Color: None Present (0%) Medium (34-66%) Medium (34-66%) Granulation A mount: N/A Red, Pink Pale Granulation Quality: None Present (0%) None Present (0%) None Present (0%) Necrotic A mount: Fascia: No Fascia: No Fascia: No Exposed Structures: Fat Layer (Subcutaneous Tissue): No Fat Layer (Subcutaneous Tissue): No Fat Layer (Subcutaneous Tissue): No Tendon: No Tendon: No Tendon: No Muscle: No Muscle: No Muscle: No Joint: No Joint: No Joint: No Bone: No Bone: No Bone: No None Medium (34-66%) Small (1-33%) Epithelialization: Wound Number: 4 N/A N/A Photos: N/A N/A Left, Lateral Lower Leg N/A N/A Wound Location: Gradually Appeared N/A N/A Wounding Event: Diabetic Wound/Ulcer of the Lower N/A N/A Primary Etiology: Extremity Cataracts, Anemia, Chronic N/A N/A Comorbid History: Obstructive Pulmonary Disease (COPD), Peripheral Arterial Disease, Type II Diabetes 08/05/2023 N/A N/A Date Acquired: 2 N/A N/A Weeks of Treatment: Open N/A N/A Wound Status: No N/A N/A Wound Recurrence: 0.1x0.1x0.1 N/A N/A Measurements L x W x D (cm) 0.008 N/A N/A A (cm)  : rea 0.001 N/A N/A Volume (cm) : 98.50% N/A N/A % Reduction in A rea: 98.10% N/A N/A % Reduction in Volume: Grade 2 N/A N/A Classification: Medium N/A N/A Exudate A mount: Serosanguineous N/A N/A Exudate Type: red, brown N/A N/A Exudate Color: Medium (34-66%) N/A N/A Granulation A mount: Red, Pink N/A N/A Granulation Quality: None Present (0%) N/A N/A Necrotic A mount: Fascia: No N/A N/A Exposed Structures: Fat Layer (Subcutaneous Tissue): No Tendon: No Muscle: No Joint: No Bone: No Medium (34-66%) N/A N/A Epithelialization: Treatment Notes Electronic Signature(s) Signed: 08/21/2023 1:36:59 PM By: Midge Aver MSN RN CNS WTA Entered By: Midge Aver on 08/19/2023 12:11:14 [name] ([id number]) [id number] Page 4 of 9 -------------------------------------------------------------------------------- Pain Assessment Details Patient Name: Date of Service: [name], Elayne Snare 08/19/2023 2:15 PM Medical Record Number: [id number] Patient Account Number: [id number] Date of Birth/Sex: Treating RN: [birth year] (77 y.o. Ginette Pitman Primary Care Vaishnavi Dalby: Maryelizabeth Rowan Other Clinician: Referring Koury Roddy: Treating Ethie Curless/Extender: Chauncey Mann, [name] [place] in Treatment: 2 Active Problems Location of Pain Severity and Description of Pain Patient Has Paino No Site Locations Pain Management and Medication Current Pain Management: Electronic Signature(s) Signed: 08/21/2023 1:36:59 PM By: Midge Aver MSN RN CNS WTA Entered By: Midge Aver on 08/19/2023 11:45:59 -------------------------------------------------------------------------------- Wound Assessment Details Patient Name: Date of Service: [name], [name] TRICE F. 08/19/2023 2:15 PM Medical Record Number: [id number] Patient Account Number: [id number] Date of Birth/Sex: Treating RN: 11-Nov-1946 (77 y.o. Ginette Pitman Primary Care Dalyah Pla: Maryelizabeth Rowan Other  Clinician: Referring Rease Swinson: Treating Dontell Mian/Extender: Chauncey Mann, [name] [place] in Treatment: 2 [name] ([id number]) [id number] Page 5 of 9 Wound Status Wound Number: 1 Primary Diabetic Wound/Ulcer of the Lower Extremity Etiology: Wound Location: Right, Posterior T Great oe Wound Healed - Epithelialized Wounding Event: Gradually Appeared Status: Date Acquired: 08/05/2023 Comorbid Cataracts, Anemia, Chronic Obstructive Pulmonary Disease Weeks Of Treatment: 2 History: (COPD), Peripheral Arterial Disease, Type II Diabetes Clustered Wound: No Photos Wound Measurements Length: (cm) Width: (cm) Depth: (cm) Area: (cm) Volume: (cm) 0 % Reduction in Area: 100% 0 % Reduction in Volume: 100% 0 Epithelialization: None 0 0 Wound Description Classification: Grade 1 Exudate Amount: None Present Foul Odor After Cleansing: No Slough/Fibrino No Wound Bed Granulation Amount: None Present (0%) Exposed  Gradually Appeared Status: Date Acquired: 08/05/2023 Comorbid Cataracts, Anemia, Chronic Obstructive Pulmonary Disease Weeks Of Treatment: 2 History: (COPD), Peripheral Arterial Disease, Type II Diabetes Clustered Wound: No [name], [name] ([id number]) [id number]  Page 8 of 9 Photos Wound Measurements Length: (cm) Width: (cm) Depth: (cm) Area: (cm) Volume: (cm) 0 % Reduction in Area: 100% 0 % Reduction in Volume: 100% 0 Epithelialization: Medium (34-66%) 0 0 Wound Description Classification: Grade 2 Exudate Amount: Medium Exudate Type: Serosanguineous Exudate Color: red, brown Foul Odor After Cleansing: No Slough/Fibrino No Wound Bed Granulation Amount: Medium (34-66%) Exposed Structure Granulation Quality: Red, Pink Fascia Exposed: No Necrotic Amount: None Present (0%) Fat Layer (Subcutaneous Tissue) Exposed: No Tendon Exposed: No Muscle Exposed: No Joint Exposed: No Bone Exposed: No Electronic Signature(s) Signed: 08/21/2023 1:36:59 PM By: Midge Aver MSN RN CNS WTA Entered By: Midge Aver on 08/19/2023 12:12:18 -------------------------------------------------------------------------------- Vitals Details Patient Name: Date of Service: [name], [name] TRICE F. 08/19/2023 2:15 PM Medical Record Number: [id number] Patient Account Number: [id number] Date of Birth/Sex: Treating RN: [birth year] (77 y.o. Ginette Pitman Primary Care Malikah Lakey: Maryelizabeth Rowan Other Clinician: Referring Mishelle Hassan: Treating Matalyn Nawaz/Extender: Chauncey Mann, [name] [place] in Treatment: 2 Vital Signs Time Taken: 14:45 Temperature (F): 97.8 Height (in): 60 Pulse (bpm): 56 Weight (lbs): 155 Respiratory Rate (breaths/min): 18 Body Mass Index (BMI): 30.3 Blood Pressure (mmHg): 160/47 Reference Range: 80 - 120 mg / dl Electronic Signature(s) Signed: 08/21/2023 1:36:59 PM By: Midge Aver MSN RN CNS [address], [place] F ([id number]) PM By: Midge Aver MSN RN CNS WTA [phone].pdf Page 9 of 9 Signed: 08/21/2023 1:36:59 Entered By: Midge Aver on 08/19/2023 11:45:51  Structure Necrotic Amount: None Present (0%) Fascia Exposed: No Fat Layer (Subcutaneous Tissue) Exposed: No Tendon Exposed: No Muscle Exposed: No Joint Exposed: No Bone Exposed: No Electronic Signature(s) Signed: 08/21/2023 1:36:59 PM By: Midge Aver MSN RN CNS WTA Entered By: Midge Aver on 08/19/2023 12:12:01 -------------------------------------------------------------------------------- Wound Assessment Details Patient Name: Date of Service: [name], [name] TRICE F. 08/19/2023 2:15 PM Medical Record Number: [id number] Patient Account Number: [id number] Date of Birth/Sex: Treating RN: 05/05/1946 (77 y.o. Ginette Pitman Primary Care Esten Dollar: Maryelizabeth Rowan Other Clinician: Referring Coltrane Tugwell: Treating Zyasia Halbleib/Extender: Chauncey Mann, [name] [place] in Treatment: 2 Wound Status Wound Number: 2 Primary Diabetic Wound/Ulcer of the Lower Extremity Etiology: Wound Location: Right, Medial Lower Leg ENNIFER, HARSTON F ([id number]) [phone].pdf Page 6 of 9 Wound Healed - Epithelialized Wounding Event: Gradually Appeared Status: Date Acquired: 08/05/2023 Comorbid Cataracts, Anemia, Chronic Obstructive Pulmonary Disease Weeks Of Treatment: 2 History: (COPD), Peripheral Arterial Disease, Type II Diabetes Clustered Wound: No Photos Wound Measurements Length: (cm) Width: (cm) Depth: (cm) Area: (cm) Volume: (cm) 0 % Reduction in Area: 100% 0 % Reduction in Volume: 100% 0 Epithelialization: Medium (34-66%) 0 0 Wound Description Classification: Grade 2 Exudate Amount: Medium Exudate Type: Serosanguineous Exudate Color: red, brown Foul Odor After Cleansing: No Slough/Fibrino No Wound Bed Granulation Amount: Medium (34-66%) Exposed Structure Granulation Quality: Red, Pink Fascia Exposed: No Necrotic Amount: None Present (0%) Fat Layer (Subcutaneous Tissue) Exposed: No Tendon Exposed: No Muscle Exposed: No Joint Exposed: No Bone Exposed: No Electronic Signature(s) Signed: 08/21/2023 1:36:59 PM By: Midge Aver MSN RN CNS WTA Entered By: Midge Aver on 08/19/2023 12:12:07 -------------------------------------------------------------------------------- Wound Assessment Details Patient Name: Date of Service: [name], [name] TRICE F. 08/19/2023 2:15 PM Medical Record Number: [id number] Patient Account Number: [id number] Date of Birth/Sex: Treating RN: [birth year] (77 y.o. Ginette Pitman Primary Care Calvary Difranco: Maryelizabeth Rowan Other Clinician: Referring Memorie Yokoyama: Treating Ivi Griffith/Extender: Chauncey Mann, [name] [place] in Treatment: 2 Wound Status Wound Number: 3 Primary Diabetic Wound/Ulcer of the Lower Extremity Etiology: Wound Location: Left, Posterior Lower Leg Wound Healed - Epithelialized Wounding Event: Gradually Appeared Status: Date Acquired: 08/05/2023 Comorbid Cataracts, Anemia, Chronic Obstructive Pulmonary Disease Weeks Of  Treatment: 2 FREDERICKA, BOTTCHER ([id number]) [phone].pdf Page 7 of 9 Weeks Of Treatment: 2 History: (COPD), Peripheral Arterial Disease, Type II Diabetes Clustered Wound: No Photos Wound Measurements Length: (cm) Width: (cm) Depth: (cm) Area: (cm) Volume: (cm) 0 % Reduction in Area: 100% 0 % Reduction in Volume: 100% 0 Epithelialization: Small (1-33%) 0 0 Wound Description Classification: Grade 1 Exudate Amount: Medium Exudate Type: Serosanguineous Exudate Color: red, brown Foul Odor After Cleansing: No Slough/Fibrino No Wound Bed Granulation Amount: Medium (34-66%) Exposed Structure Granulation Quality: Pale Fascia Exposed: No Necrotic Amount: None Present (0%) Fat Layer (Subcutaneous Tissue) Exposed: No Tendon Exposed: No Muscle Exposed: No Joint Exposed: No Bone Exposed: No Electronic Signature(s) Signed: 08/21/2023 1:36:59 PM By: Midge Aver MSN RN CNS WTA Entered By: Midge Aver on 08/19/2023 12:12:12 -------------------------------------------------------------------------------- Wound Assessment Details Patient Name: Date of Service: [name], [name] TRICE F. 08/19/2023 2:15 PM Medical Record Number: [id number] Patient Account Number: [id number] Date of Birth/Sex: Treating RN: 1946-02-27 (77 y.o. Ginette Pitman Primary Care Yassmine Tamm: Maryelizabeth Rowan Other Clinician: Referring Kiauna Zywicki: Treating Crecencio Kwiatek/Extender: Chauncey Mann, [name] [place] in Treatment: 2 Wound Status Wound Number: 4 Primary Diabetic Wound/Ulcer of the Lower Extremity Etiology: Wound Location: Left, Lateral Lower Leg Wound Healed - Epithelialized Wounding Event:  Structure Necrotic Amount: None Present (0%) Fascia Exposed: No Fat Layer (Subcutaneous Tissue) Exposed: No Tendon Exposed: No Muscle Exposed: No Joint Exposed: No Bone Exposed: No Electronic Signature(s) Signed: 08/21/2023 1:36:59 PM By: Midge Aver MSN RN CNS WTA Entered By: Midge Aver on 08/19/2023 12:12:01 -------------------------------------------------------------------------------- Wound Assessment Details Patient Name: Date of Service: [name], [name] TRICE F. 08/19/2023 2:15 PM Medical Record Number: [id number] Patient Account Number: [id number] Date of Birth/Sex: Treating RN: 05/05/1946 (77 y.o. Ginette Pitman Primary Care Esten Dollar: Maryelizabeth Rowan Other Clinician: Referring Coltrane Tugwell: Treating Zyasia Halbleib/Extender: Chauncey Mann, [name] [place] in Treatment: 2 Wound Status Wound Number: 2 Primary Diabetic Wound/Ulcer of the Lower Extremity Etiology: Wound Location: Right, Medial Lower Leg ENNIFER, HARSTON F ([id number]) [phone].pdf Page 6 of 9 Wound Healed - Epithelialized Wounding Event: Gradually Appeared Status: Date Acquired: 08/05/2023 Comorbid Cataracts, Anemia, Chronic Obstructive Pulmonary Disease Weeks Of Treatment: 2 History: (COPD), Peripheral Arterial Disease, Type II Diabetes Clustered Wound: No Photos Wound Measurements Length: (cm) Width: (cm) Depth: (cm) Area: (cm) Volume: (cm) 0 % Reduction in Area: 100% 0 % Reduction in Volume: 100% 0 Epithelialization: Medium (34-66%) 0 0 Wound Description Classification: Grade 2 Exudate Amount: Medium Exudate Type: Serosanguineous Exudate Color: red, brown Foul Odor After Cleansing: No Slough/Fibrino No Wound Bed Granulation Amount: Medium (34-66%) Exposed Structure Granulation Quality: Red, Pink Fascia Exposed: No Necrotic Amount: None Present (0%) Fat Layer (Subcutaneous Tissue) Exposed: No Tendon Exposed: No Muscle Exposed: No Joint Exposed: No Bone Exposed: No Electronic Signature(s) Signed: 08/21/2023 1:36:59 PM By: Midge Aver MSN RN CNS WTA Entered By: Midge Aver on 08/19/2023 12:12:07 -------------------------------------------------------------------------------- Wound Assessment Details Patient Name: Date of Service: [name], [name] TRICE F. 08/19/2023 2:15 PM Medical Record Number: [id number] Patient Account Number: [id number] Date of Birth/Sex: Treating RN: [birth year] (77 y.o. Ginette Pitman Primary Care Calvary Difranco: Maryelizabeth Rowan Other Clinician: Referring Memorie Yokoyama: Treating Ivi Griffith/Extender: Chauncey Mann, [name] [place] in Treatment: 2 Wound Status Wound Number: 3 Primary Diabetic Wound/Ulcer of the Lower Extremity Etiology: Wound Location: Left, Posterior Lower Leg Wound Healed - Epithelialized Wounding Event: Gradually Appeared Status: Date Acquired: 08/05/2023 Comorbid Cataracts, Anemia, Chronic Obstructive Pulmonary Disease Weeks Of  Treatment: 2 FREDERICKA, BOTTCHER ([id number]) [phone].pdf Page 7 of 9 Weeks Of Treatment: 2 History: (COPD), Peripheral Arterial Disease, Type II Diabetes Clustered Wound: No Photos Wound Measurements Length: (cm) Width: (cm) Depth: (cm) Area: (cm) Volume: (cm) 0 % Reduction in Area: 100% 0 % Reduction in Volume: 100% 0 Epithelialization: Small (1-33%) 0 0 Wound Description Classification: Grade 1 Exudate Amount: Medium Exudate Type: Serosanguineous Exudate Color: red, brown Foul Odor After Cleansing: No Slough/Fibrino No Wound Bed Granulation Amount: Medium (34-66%) Exposed Structure Granulation Quality: Pale Fascia Exposed: No Necrotic Amount: None Present (0%) Fat Layer (Subcutaneous Tissue) Exposed: No Tendon Exposed: No Muscle Exposed: No Joint Exposed: No Bone Exposed: No Electronic Signature(s) Signed: 08/21/2023 1:36:59 PM By: Midge Aver MSN RN CNS WTA Entered By: Midge Aver on 08/19/2023 12:12:12 -------------------------------------------------------------------------------- Wound Assessment Details Patient Name: Date of Service: [name], [name] TRICE F. 08/19/2023 2:15 PM Medical Record Number: [id number] Patient Account Number: [id number] Date of Birth/Sex: Treating RN: 1946-02-27 (77 y.o. Ginette Pitman Primary Care Yassmine Tamm: Maryelizabeth Rowan Other Clinician: Referring Kiauna Zywicki: Treating Crecencio Kwiatek/Extender: Chauncey Mann, [name] [place] in Treatment: 2 Wound Status Wound Number: 4 Primary Diabetic Wound/Ulcer of the Lower Extremity Etiology: Wound Location: Left, Lateral Lower Leg Wound Healed - Epithelialized Wounding Event:

## 2023-09-01 DIAGNOSIS — M25561 Pain in right knee: Secondary | ICD-10-CM | POA: Insufficient documentation

## 2023-09-05 ENCOUNTER — Emergency Department (HOSPITAL_COMMUNITY)
Admission: EM | Admit: 2023-09-05 | Discharge: 2023-09-05 | Disposition: A | Discharge status: Home / Self Care | Payer: Medicare HMO | Attending: Emergency Medicine | Admitting: Emergency Medicine

## 2023-09-05 ENCOUNTER — Encounter (HOSPITAL_COMMUNITY): Payer: Self-pay

## 2023-09-05 ENCOUNTER — Other Ambulatory Visit: Payer: Self-pay

## 2023-09-05 DIAGNOSIS — Z794 Long term (current) use of insulin: Secondary | ICD-10-CM | POA: Diagnosis not present

## 2023-09-05 DIAGNOSIS — E119 Type 2 diabetes mellitus without complications: Secondary | ICD-10-CM | POA: Insufficient documentation

## 2023-09-05 DIAGNOSIS — Z7984 Long term (current) use of oral hypoglycemic drugs: Secondary | ICD-10-CM | POA: Insufficient documentation

## 2023-09-05 DIAGNOSIS — Z7982 Long term (current) use of aspirin: Secondary | ICD-10-CM | POA: Insufficient documentation

## 2023-09-05 DIAGNOSIS — F0150 Vascular dementia without behavioral disturbance: Secondary | ICD-10-CM | POA: Diagnosis not present

## 2023-09-05 DIAGNOSIS — R4689 Other symptoms and signs involving appearance and behavior: Secondary | ICD-10-CM | POA: Insufficient documentation

## 2023-09-05 DIAGNOSIS — R4182 Altered mental status, unspecified: Secondary | ICD-10-CM | POA: Diagnosis present

## 2023-09-05 LAB — URINALYSIS, ROUTINE W REFLEX MICROSCOPIC
Bilirubin Urine: NEGATIVE
Glucose, UA: NEGATIVE mg/dL
Ketones, ur: NEGATIVE mg/dL
Leukocytes,Ua: NEGATIVE
Nitrite: NEGATIVE
Protein, ur: NEGATIVE mg/dL
Specific Gravity, Urine: 1.008 (ref 1.005–1.030)
pH: 7 (ref 5.0–8.0)

## 2023-09-05 LAB — BASIC METABOLIC PANEL
Anion gap: 8 (ref 5–15)
BUN: 49 mg/dL — ABNORMAL HIGH (ref 8–23)
CO2: 23 mmol/L (ref 22–32)
Calcium: 8.6 mg/dL — ABNORMAL LOW (ref 8.9–10.3)
Chloride: 105 mmol/L (ref 98–111)
Creatinine, Ser: 0.87 mg/dL (ref 0.44–1.00)
GFR, Estimated: >60 mL/min (ref >60)
Glucose, Bld: 178 mg/dL — ABNORMAL HIGH (ref 70–99)
Potassium: 4.7 mmol/L (ref 3.5–5.1)
Sodium: 136 mmol/L (ref 135–145)

## 2023-09-05 LAB — CBC WITH DIFFERENTIAL/PLATELET
Abs Immature Granulocytes: 0.02 10*3/uL (ref 0.00–0.07)
Basophils Absolute: 0 10*3/uL (ref 0.0–0.1)
Basophils Relative: 0 %
Eosinophils Absolute: 0 10*3/uL (ref 0.0–0.5)
Eosinophils Relative: 0 %
HCT: 25.1 % — ABNORMAL LOW (ref 36.0–46.0)
Hemoglobin: 7.4 g/dL — ABNORMAL LOW (ref 12.0–15.0)
Immature Granulocytes: 0 %
Lymphocytes Relative: 15 %
Lymphs Abs: 1.3 10*3/uL (ref 0.7–4.0)
MCH: 27.3 pg (ref 26.0–34.0)
MCHC: 29.5 g/dL — ABNORMAL LOW (ref 30.0–36.0)
MCV: 92.6 fL (ref 80.0–100.0)
Monocytes Absolute: 0.6 10*3/uL (ref 0.1–1.0)
Monocytes Relative: 7 %
Neutro Abs: 6.5 10*3/uL (ref 1.7–7.7)
Neutrophils Relative %: 78 %
Platelets: 188 10*3/uL (ref 150–400)
RBC: 2.71 MIL/uL — ABNORMAL LOW (ref 3.87–5.11)
RDW: 15.2 % (ref 11.5–15.5)
WBC: 8.4 10*3/uL (ref 4.0–10.5)
nRBC: 0 % (ref 0.0–0.2)

## 2023-09-05 MED ORDER — HYDROCODONE-ACETAMINOPHEN 5-325 MG PO TABS
1.0000 | ORAL_TABLET | Freq: Once | ORAL | Status: AC
  Administered 2023-09-05: 1 via ORAL
  Filled 2023-09-05: qty 1

## 2023-09-05 NOTE — ED Provider Notes (Signed)
[place] EMERGENCY DEPARTMENT AT [facility] Provider Note   CSN: [id number] Arrival date & time: 09/05/23  1707     History {Add pertinent medical, surgical, social history, OB history to HPI:1} No chief complaint on file.   [name] is a 77 y.o. female.  Patient has a history of diabetes and hyperlipidemia and anemia.  She has been mildly confused today and her family was wondering if she had a urinary tract infection   Altered Mental Status      Home Medications Prior to Admission medications   Medication Sig Start Date End Date Taking? Authorizing Provider  ascorbic acid (VITAMIN C) 500 MG tablet Take 1 tablet (500 mg total) by mouth daily. 12/03/21   Rolly Salter, MD  aspirin 81 MG EC tablet Take 1 tablet (81 mg total) by mouth daily. RESUME AFTER 1 WEEK 09/03/21   Osvaldo Shipper, MD  atorvastatin (LIPITOR) 40 MG tablet Take 40 mg by mouth every morning. 12/15/19   [provider]  baclofen (LIORESAL) 10 MG tablet Take 10 mg by mouth 4 (four) times daily as needed for muscle spasms. 03/22/21   [provider]  cephALEXin (KEFLEX) 500 MG capsule Take 500 mg by mouth 4 (four) times daily. 07/13/23   [provider]  clopidogrel (PLAVIX) 75 MG tablet Take 1 tablet (75 mg total) by mouth at bedtime. 10/12/21   Willeen Niece, MD  docusate sodium (COLACE) 100 MG capsule Take 100 mg by mouth 2 (two) times daily.    [provider]  feeding supplement, GLUCERNA SHAKE, (GLUCERNA SHAKE) LIQD Take 237 mLs by mouth 3 (three) times daily between meals. 12/02/21   Rolly Salter, MD  furosemide (LASIX) 20 MG tablet Take 20 mg by mouth daily. 11/19/21   [provider]  gabapentin (NEURONTIN) 300 MG capsule Take 300-600 mg by mouth See admin instructions. Take one capsule (300 mg) by mouth every morning and two capsules (600 mg) at night    [provider]  gemfibrozil (LOPID) 600 MG tablet Take 600 mg by mouth 2  (two) times daily.    [provider]  insulin degludec (TRESIBA FLEXTOUCH) 100 UNIT/ML FlexTouch Pen Inject 10 Units into the skin daily. 08/28/21   Osvaldo Shipper, MD  levothyroxine (SYNTHROID) 125 MCG tablet Take 1 tablet (125 mcg total) by mouth daily at 6 (six) AM. 08/29/21 07/24/23  Osvaldo Shipper, MD  MAGnesium-Oxide 400 (240 Mg) MG tablet Take 1 tablet by mouth twice daily 08/10/23   Ladene Artist, MD  melatonin 5 MG TABS Take 1 tablet (5 mg total) by mouth at bedtime. 12/02/21   Rolly Salter, MD  metFORMIN (GLUCOPHAGE) 500 MG tablet Take 500 mg by mouth 2 (two) times daily. 02/01/21   [provider]  methocarbamol (ROBAXIN) 500 MG tablet Take 1 tablet (500 mg total) by mouth 2 (two) times daily. 05/21/23   Arby Barrette, MD  Multiple Vitamin (MULTIVITAMIN WITH MINERALS) TABS tablet Take 1 tablet by mouth daily. 12/03/21   Rolly Salter, MD  nystatin (MYCOSTATIN/NYSTOP) powder Apply 1 Application topically 2 (two) times daily. To groin rash 07/24/23   Ladene Artist, MD  ondansetron (ZOFRAN) 8 MG tablet Take 1 tablet (8 mg total) by mouth every 6 (six) hours as needed for nausea or vomiting. Patient not taking: Reported on 02/02/2023 10/31/21   Esterwood, Amy S, PA-C  pantoprazole (PROTONIX) 40 MG tablet Take 1 tablet by mouth twice daily 12/15/22  Tressia Danas, MD  polyethylene glycol powder (GLYCOLAX/MIRALAX) 17 GM/SCOOP powder Take 17 g by mouth daily.    [provider]  prochlorperazine (COMPAZINE) 10 MG tablet Take 10 mg by mouth every 6 (six) hours as needed for nausea or vomiting. Patient not taking: Reported on 02/02/2023    [provider]  sertraline (ZOLOFT) 50 MG tablet Take 50 mg by mouth daily.    [provider]  triamcinolone cream (KENALOG) 0.1 % Apply 1 application  topically 2 (two) times daily as needed (groin rash). Mix with nystatin cream Patient not taking: Reported on 02/02/2023 09/19/20   [provider]   XTAMPZA ER 9 MG C12A Take 1 capsule by mouth 2 (two) times daily. 10/31/21   Esterwood, Amy S, PA-C      Allergies    Codeine    Review of Systems   Review of Systems  Physical Exam Updated Vital Signs BP (!) 154/49   Pulse 69   Temp 98.6 F (37 C)   Resp 11   Ht 5' (1.524 m)   Wt 70.4 kg   SpO2 99%   BMI 30.31 kg/m  Physical Exam  ED Results / Procedures / Treatments   Labs (all labs ordered are listed, but only abnormal results are displayed) Labs Reviewed  CBC WITH DIFFERENTIAL/PLATELET - Abnormal; Notable for the following components:      Result Value   RBC 2.71 (*)    Hemoglobin 7.4 (*)    HCT 25.1 (*)    MCHC 29.5 (*)    All other components within normal limits  BASIC METABOLIC PANEL - Abnormal; Notable for the following components:   Glucose, Bld 178 (*)    BUN 49 (*)    Calcium 8.6 (*)    All other components within normal limits  URINALYSIS, ROUTINE W REFLEX MICROSCOPIC - Abnormal; Notable for the following components:   Color, Urine COLORLESS (*)    Hgb urine dipstick SMALL (*)    Bacteria, UA RARE (*)    All other components within normal limits  URINE CULTURE    EKG None  Radiology No results found.  Procedures Procedures  {Document cardiac monitor, telemetry assessment procedure when appropriate:1}  Medications Ordered in ED Medications  HYDROcodone-acetaminophen (NORCO/VICODIN) 5-325 MG per tablet 1 tablet (1 tablet Oral Given 09/05/23 2102)    ED Course/ Medical Decision Making/ A&P  Patient's confusion improved in the emergency department.  She is mildly dehydrated and has anemia.  We will get a urine culture and she will follow-up with her PCP in 2 days {   Click here for ABCD2, HEART and other calculatorsREFRESH Note before signing :1}                              Medical Decision Making Amount and/or Complexity of Data Reviewed Labs: ordered.  Risk Prescription drug management.   Altered mental status and possible  urinary tract infection with anemia and dehydration.  Patient improved at discharge and will follow-up with PCP.  Urine culture pending  {Document critical care time when appropriate:1} {Document review of labs and clinical decision tools ie heart score, Chads2Vasc2 etc:1}  {Document your independent review of radiology images, and any outside records:1} {Document your discussion with family members, caretakers, and with consultants:1} {Document social determinants of health affecting pt's care:1} {Document your decision making why or why not admission, treatments were needed:1} Final Clinical Impression(s) / ED Diagnoses Final  diagnoses:  None    Rx / DC Orders ED Discharge Orders     None

## 2023-09-05 NOTE — ED Triage Notes (Signed)
Patient BIB EMS for groin pain and altered mental status per family. Family states she is acting similar to when she has a UTI. No fevers. Patient is A&Ox4, but confused. Patient also c/o left leg pain.

## 2023-09-05 NOTE — Discharge Instructions (Signed)
Follow-up with your doctor next week as planned.  The urine culture should be back in 2 to 3 days

## 2023-09-07 LAB — URINE CULTURE

## 2023-09-10 ENCOUNTER — Emergency Department (HOSPITAL_COMMUNITY): Payer: Medicare HMO

## 2023-09-10 ENCOUNTER — Encounter (HOSPITAL_COMMUNITY): Payer: Self-pay | Admitting: Emergency Medicine

## 2023-09-10 ENCOUNTER — Inpatient Hospital Stay (HOSPITAL_COMMUNITY)
Admission: EM | Admit: 2023-09-10 | Discharge: 2023-09-13 | DRG: 377 | Disposition: A | Discharge status: Home / Self Care | Payer: Medicare HMO | Attending: Internal Medicine | Admitting: Internal Medicine

## 2023-09-10 ENCOUNTER — Other Ambulatory Visit: Payer: Self-pay

## 2023-09-10 ENCOUNTER — Telehealth: Payer: Self-pay

## 2023-09-10 DIAGNOSIS — Z87442 Personal history of urinary calculi: Secondary | ICD-10-CM

## 2023-09-10 DIAGNOSIS — E890 Postprocedural hypothyroidism: Secondary | ICD-10-CM | POA: Diagnosis present

## 2023-09-10 DIAGNOSIS — K264 Chronic or unspecified duodenal ulcer with hemorrhage: Principal | ICD-10-CM | POA: Diagnosis present

## 2023-09-10 DIAGNOSIS — E1165 Type 2 diabetes mellitus with hyperglycemia: Secondary | ICD-10-CM | POA: Diagnosis present

## 2023-09-10 DIAGNOSIS — J42 Unspecified chronic bronchitis: Secondary | ICD-10-CM | POA: Diagnosis present

## 2023-09-10 DIAGNOSIS — K922 Gastrointestinal hemorrhage, unspecified: Secondary | ICD-10-CM | POA: Diagnosis not present

## 2023-09-10 DIAGNOSIS — K2960 Other gastritis without bleeding: Secondary | ICD-10-CM | POA: Diagnosis present

## 2023-09-10 DIAGNOSIS — Y842 Radiological procedure and radiotherapy as the cause of abnormal reaction of the patient, or of later complication, without mention of misadventure at the time of the procedure: Secondary | ICD-10-CM | POA: Diagnosis present

## 2023-09-10 DIAGNOSIS — Z9841 Cataract extraction status, right eye: Secondary | ICD-10-CM

## 2023-09-10 DIAGNOSIS — K222 Esophageal obstruction: Secondary | ICD-10-CM | POA: Diagnosis present

## 2023-09-10 DIAGNOSIS — Z923 Personal history of irradiation: Secondary | ICD-10-CM

## 2023-09-10 DIAGNOSIS — I6521 Occlusion and stenosis of right carotid artery: Secondary | ICD-10-CM | POA: Diagnosis present

## 2023-09-10 DIAGNOSIS — Z87440 Personal history of urinary (tract) infections: Secondary | ICD-10-CM

## 2023-09-10 DIAGNOSIS — I10 Essential (primary) hypertension: Secondary | ICD-10-CM | POA: Diagnosis not present

## 2023-09-10 DIAGNOSIS — K315 Obstruction of duodenum: Secondary | ICD-10-CM | POA: Diagnosis present

## 2023-09-10 DIAGNOSIS — I5032 Chronic diastolic (congestive) heart failure: Secondary | ICD-10-CM | POA: Diagnosis present

## 2023-09-10 DIAGNOSIS — R600 Localized edema: Secondary | ICD-10-CM | POA: Diagnosis present

## 2023-09-10 DIAGNOSIS — E785 Hyperlipidemia, unspecified: Secondary | ICD-10-CM | POA: Diagnosis present

## 2023-09-10 DIAGNOSIS — E11649 Type 2 diabetes mellitus with hypoglycemia without coma: Secondary | ICD-10-CM | POA: Diagnosis present

## 2023-09-10 DIAGNOSIS — Z794 Long term (current) use of insulin: Secondary | ICD-10-CM | POA: Diagnosis not present

## 2023-09-10 DIAGNOSIS — Z87891 Personal history of nicotine dependence: Secondary | ICD-10-CM

## 2023-09-10 DIAGNOSIS — Z833 Family history of diabetes mellitus: Secondary | ICD-10-CM

## 2023-09-10 DIAGNOSIS — I6930 Unspecified sequelae of cerebral infarction: Secondary | ICD-10-CM

## 2023-09-10 DIAGNOSIS — E1169 Type 2 diabetes mellitus with other specified complication: Secondary | ICD-10-CM | POA: Diagnosis present

## 2023-09-10 DIAGNOSIS — G928 Other toxic encephalopathy: Secondary | ICD-10-CM | POA: Diagnosis present

## 2023-09-10 DIAGNOSIS — Z85048 Personal history of other malignant neoplasm of rectum, rectosigmoid junction, and anus: Secondary | ICD-10-CM

## 2023-09-10 DIAGNOSIS — G9340 Encephalopathy, unspecified: Secondary | ICD-10-CM | POA: Diagnosis not present

## 2023-09-10 DIAGNOSIS — K219 Gastro-esophageal reflux disease without esophagitis: Secondary | ICD-10-CM | POA: Diagnosis present

## 2023-09-10 DIAGNOSIS — Z8673 Personal history of transient ischemic attack (TIA), and cerebral infarction without residual deficits: Secondary | ICD-10-CM | POA: Diagnosis not present

## 2023-09-10 DIAGNOSIS — Z885 Allergy status to narcotic agent status: Secondary | ICD-10-CM

## 2023-09-10 DIAGNOSIS — Z7984 Long term (current) use of oral hypoglycemic drugs: Secondary | ICD-10-CM

## 2023-09-10 DIAGNOSIS — K269 Duodenal ulcer, unspecified as acute or chronic, without hemorrhage or perforation: Secondary | ICD-10-CM | POA: Diagnosis not present

## 2023-09-10 DIAGNOSIS — R531 Weakness: Secondary | ICD-10-CM | POA: Diagnosis present

## 2023-09-10 DIAGNOSIS — Z7902 Long term (current) use of antithrombotics/antiplatelets: Secondary | ICD-10-CM

## 2023-09-10 DIAGNOSIS — Z933 Colostomy status: Secondary | ICD-10-CM

## 2023-09-10 DIAGNOSIS — Z961 Presence of intraocular lens: Secondary | ICD-10-CM | POA: Diagnosis present

## 2023-09-10 DIAGNOSIS — G8929 Other chronic pain: Secondary | ICD-10-CM | POA: Diagnosis present

## 2023-09-10 DIAGNOSIS — Z96642 Presence of left artificial hip joint: Secondary | ICD-10-CM | POA: Diagnosis present

## 2023-09-10 DIAGNOSIS — Z8616 Personal history of COVID-19: Secondary | ICD-10-CM

## 2023-09-10 DIAGNOSIS — T402X5A Adverse effect of other opioids, initial encounter: Secondary | ICD-10-CM | POA: Diagnosis present

## 2023-09-10 DIAGNOSIS — Z79891 Long term (current) use of opiate analgesic: Secondary | ICD-10-CM

## 2023-09-10 DIAGNOSIS — Z825 Family history of asthma and other chronic lower respiratory diseases: Secondary | ICD-10-CM

## 2023-09-10 DIAGNOSIS — R609 Edema, unspecified: Secondary | ICD-10-CM | POA: Diagnosis not present

## 2023-09-10 DIAGNOSIS — Z95828 Presence of other vascular implants and grafts: Secondary | ICD-10-CM

## 2023-09-10 DIAGNOSIS — Z90710 Acquired absence of both cervix and uterus: Secondary | ICD-10-CM

## 2023-09-10 DIAGNOSIS — M1990 Unspecified osteoarthritis, unspecified site: Secondary | ICD-10-CM | POA: Diagnosis present

## 2023-09-10 DIAGNOSIS — Z79890 Hormone replacement therapy (postmenopausal): Secondary | ICD-10-CM

## 2023-09-10 DIAGNOSIS — D649 Anemia, unspecified: Secondary | ICD-10-CM | POA: Diagnosis not present

## 2023-09-10 DIAGNOSIS — D500 Iron deficiency anemia secondary to blood loss (chronic): Secondary | ICD-10-CM | POA: Diagnosis present

## 2023-09-10 DIAGNOSIS — T66XXXA Radiation sickness, unspecified, initial encounter: Secondary | ICD-10-CM | POA: Diagnosis present

## 2023-09-10 DIAGNOSIS — E1140 Type 2 diabetes mellitus with diabetic neuropathy, unspecified: Secondary | ICD-10-CM | POA: Diagnosis present

## 2023-09-10 DIAGNOSIS — R9431 Abnormal electrocardiogram [ECG] [EKG]: Secondary | ICD-10-CM | POA: Diagnosis present

## 2023-09-10 DIAGNOSIS — Z9221 Personal history of antineoplastic chemotherapy: Secondary | ICD-10-CM

## 2023-09-10 DIAGNOSIS — N179 Acute kidney failure, unspecified: Secondary | ICD-10-CM | POA: Diagnosis present

## 2023-09-10 DIAGNOSIS — F1120 Opioid dependence, uncomplicated: Secondary | ICD-10-CM | POA: Insufficient documentation

## 2023-09-10 DIAGNOSIS — K3189 Other diseases of stomach and duodenum: Secondary | ICD-10-CM | POA: Diagnosis present

## 2023-09-10 DIAGNOSIS — E039 Hypothyroidism, unspecified: Secondary | ICD-10-CM | POA: Diagnosis present

## 2023-09-10 DIAGNOSIS — Z7982 Long term (current) use of aspirin: Secondary | ICD-10-CM

## 2023-09-10 DIAGNOSIS — Z8701 Personal history of pneumonia (recurrent): Secondary | ICD-10-CM

## 2023-09-10 DIAGNOSIS — Z79899 Other long term (current) drug therapy: Secondary | ICD-10-CM

## 2023-09-10 DIAGNOSIS — Z9049 Acquired absence of other specified parts of digestive tract: Secondary | ICD-10-CM

## 2023-09-10 DIAGNOSIS — I110 Hypertensive heart disease with heart failure: Secondary | ICD-10-CM | POA: Diagnosis present

## 2023-09-10 DIAGNOSIS — Z9842 Cataract extraction status, left eye: Secondary | ICD-10-CM

## 2023-09-10 DIAGNOSIS — E119 Type 2 diabetes mellitus without complications: Secondary | ICD-10-CM

## 2023-09-10 DIAGNOSIS — C20 Malignant neoplasm of rectum: Secondary | ICD-10-CM | POA: Diagnosis present

## 2023-09-10 DIAGNOSIS — I890 Lymphedema, not elsewhere classified: Secondary | ICD-10-CM | POA: Diagnosis present

## 2023-09-10 LAB — CBC WITH DIFFERENTIAL/PLATELET
Abs Immature Granulocytes: 0.04 10*3/uL (ref 0.00–0.07)
Basophils Absolute: 0 10*3/uL (ref 0.0–0.1)
Basophils Relative: 0 %
Eosinophils Absolute: 0 10*3/uL (ref 0.0–0.5)
Eosinophils Relative: 0 %
HCT: 13.6 % — ABNORMAL LOW (ref 36.0–46.0)
Hemoglobin: 3.9 g/dL — CL (ref 12.0–15.0)
Immature Granulocytes: 1 %
Lymphocytes Relative: 14 %
Lymphs Abs: 1.1 10*3/uL (ref 0.7–4.0)
MCH: 26.9 pg (ref 26.0–34.0)
MCHC: 28.7 g/dL — ABNORMAL LOW (ref 30.0–36.0)
MCV: 93.8 fL (ref 80.0–100.0)
Monocytes Absolute: 0.6 10*3/uL (ref 0.1–1.0)
Monocytes Relative: 7 %
Neutro Abs: 6.6 10*3/uL (ref 1.7–7.7)
Neutrophils Relative %: 78 %
Platelets: 200 10*3/uL (ref 150–400)
RBC: 1.45 MIL/uL — ABNORMAL LOW (ref 3.87–5.11)
RDW: 16.9 % — ABNORMAL HIGH (ref 11.5–15.5)
WBC: 8.4 10*3/uL (ref 4.0–10.5)
nRBC: 0 % (ref 0.0–0.2)

## 2023-09-10 LAB — URINALYSIS, ROUTINE W REFLEX MICROSCOPIC
Bilirubin Urine: NEGATIVE
Glucose, UA: NEGATIVE mg/dL
Hgb urine dipstick: NEGATIVE
Ketones, ur: NEGATIVE mg/dL
Leukocytes,Ua: NEGATIVE
Nitrite: NEGATIVE
Protein, ur: NEGATIVE mg/dL
Specific Gravity, Urine: 1.011 (ref 1.005–1.030)
pH: 6 (ref 5.0–8.0)

## 2023-09-10 LAB — COMPREHENSIVE METABOLIC PANEL
ALT: 10 U/L (ref 0–44)
AST: 15 U/L (ref 15–41)
Albumin: 3 g/dL — ABNORMAL LOW (ref 3.5–5.0)
Alkaline Phosphatase: 98 U/L (ref 38–126)
Anion gap: 9 (ref 5–15)
BUN: 43 mg/dL — ABNORMAL HIGH (ref 8–23)
CO2: 21 mmol/L — ABNORMAL LOW (ref 22–32)
Calcium: 8.1 mg/dL — ABNORMAL LOW (ref 8.9–10.3)
Chloride: 105 mmol/L (ref 98–111)
Creatinine, Ser: 1.2 mg/dL — ABNORMAL HIGH (ref 0.44–1.00)
GFR, Estimated: 47 mL/min — ABNORMAL LOW (ref >60)
Glucose, Bld: 156 mg/dL — ABNORMAL HIGH (ref 70–99)
Potassium: 4.4 mmol/L (ref 3.5–5.1)
Sodium: 135 mmol/L (ref 135–145)
Total Bilirubin: 0.5 mg/dL (ref 0.3–1.2)
Total Protein: 5.5 g/dL — ABNORMAL LOW (ref 6.5–8.1)

## 2023-09-10 LAB — HEMOGLOBIN AND HEMATOCRIT, BLOOD
HCT: 13.5 % — ABNORMAL LOW (ref 36.0–46.0)
Hemoglobin: 3.8 g/dL — CL (ref 12.0–15.0)

## 2023-09-10 LAB — POC OCCULT BLOOD, ED: Fecal Occult Bld: POSITIVE — AB

## 2023-09-10 LAB — TROPONIN I (HIGH SENSITIVITY): Troponin I (High Sensitivity): 12 ng/L (ref <18)

## 2023-09-10 LAB — PREPARE RBC (CROSSMATCH)

## 2023-09-10 MED ORDER — SODIUM CHLORIDE 0.9% IV SOLUTION: Freq: Once | INTRAVENOUS | Status: DC

## 2023-09-10 MED ORDER — HYDROCODONE-ACETAMINOPHEN 5-325 MG PO TABS
1.0000 | ORAL_TABLET | ORAL | Status: DC | PRN
  Administered 2023-09-11 (×3): 1 via ORAL
  Filled 2023-09-10 (×3): qty 1

## 2023-09-10 MED ORDER — PANTOPRAZOLE SODIUM 40 MG IV SOLR
80.0000 mg | Freq: Two times a day (BID) | INTRAVENOUS | Status: DC
  Administered 2023-09-11 – 2023-09-13 (×5): 80 mg via INTRAVENOUS
  Filled 2023-09-10 (×6): qty 20

## 2023-09-10 MED ORDER — SODIUM CHLORIDE 0.9 % IV SOLN: INTRAVENOUS | Status: DC

## 2023-09-10 MED ORDER — PANTOPRAZOLE SODIUM 40 MG IV SOLR
40.0000 mg | Freq: Once | INTRAVENOUS | Status: AC
  Administered 2023-09-10: 40 mg via INTRAVENOUS
  Filled 2023-09-10: qty 10

## 2023-09-10 MED ORDER — ACETAMINOPHEN 650 MG RE SUPP: 650.0000 mg | Freq: Four times a day (QID) | RECTAL | Status: DC | PRN

## 2023-09-10 MED ORDER — SODIUM CHLORIDE 0.9% IV SOLUTION: Freq: Once | INTRAVENOUS | Status: AC

## 2023-09-10 MED ORDER — INSULIN ASPART 100 UNIT/ML IJ SOLN
0.0000 [IU] | INTRAMUSCULAR | Status: DC
  Administered 2023-09-11: 3 [IU] via SUBCUTANEOUS
  Administered 2023-09-11: 1 [IU] via SUBCUTANEOUS
  Administered 2023-09-12: 5 [IU] via SUBCUTANEOUS
  Administered 2023-09-12 – 2023-09-13 (×3): 1 [IU] via SUBCUTANEOUS
  Administered 2023-09-13: 3 [IU] via SUBCUTANEOUS

## 2023-09-10 MED ORDER — INSULIN DEGLUDEC 100 UNIT/ML ~~LOC~~ SOPN: 5.0000 [IU] | PEN_INJECTOR | Freq: Every day | SUBCUTANEOUS | Status: DC

## 2023-09-10 MED ORDER — ACETAMINOPHEN 325 MG PO TABS
650.0000 mg | ORAL_TABLET | Freq: Four times a day (QID) | ORAL | Status: DC | PRN
  Administered 2023-09-11 – 2023-09-13 (×3): 650 mg via ORAL
  Filled 2023-09-10 (×3): qty 2

## 2023-09-10 MED ORDER — INSULIN GLARGINE-YFGN 100 UNIT/ML ~~LOC~~ SOLN
5.0000 [IU] | Freq: Every day | SUBCUTANEOUS | Status: DC
  Administered 2023-09-11: 5 [IU] via SUBCUTANEOUS
  Filled 2023-09-10: qty 0.05

## 2023-09-10 MED ORDER — LACTATED RINGERS IV BOLUS
1000.0000 mL | Freq: Once | INTRAVENOUS | Status: AC
  Administered 2023-09-10: 1000 mL via INTRAVENOUS

## 2023-09-10 MED ORDER — LEVOTHYROXINE SODIUM 25 MCG PO TABS: 125.0000 ug | ORAL_TABLET | Freq: Every day | ORAL | Status: DC

## 2023-09-10 MED ORDER — SERTRALINE HCL 50 MG PO TABS
50.0000 mg | ORAL_TABLET | Freq: Every day | ORAL | Status: DC
Start: 2023-09-11 — End: 2023-09-13
  Administered 2023-09-11 – 2023-09-13 (×3): 50 mg via ORAL
  Filled 2023-09-10 (×3): qty 1

## 2023-09-10 MED ORDER — ATORVASTATIN CALCIUM 40 MG PO TABS: 40.0000 mg | ORAL_TABLET | Freq: Every morning | ORAL | Status: DC

## 2023-09-10 MED ORDER — LEVOTHYROXINE SODIUM 25 MCG PO TABS
137.0000 ug | ORAL_TABLET | Freq: Every day | ORAL | Status: DC
  Administered 2023-09-11 – 2023-09-13 (×3): 137 ug via ORAL
  Filled 2023-09-10 (×3): qty 1

## 2023-09-10 MED ORDER — ONDANSETRON HCL 4 MG/2ML IJ SOLN: 4.0000 mg | Freq: Four times a day (QID) | INTRAMUSCULAR | Status: DC | PRN

## 2023-09-10 MED ORDER — ONDANSETRON HCL 4 MG PO TABS
4.0000 mg | ORAL_TABLET | Freq: Four times a day (QID) | ORAL | Status: DC | PRN
Start: 2023-09-10 — End: 2023-09-10

## 2023-09-10 MED ORDER — CHLORHEXIDINE GLUCONATE CLOTH 2 % EX PADS
6.0000 | MEDICATED_PAD | Freq: Every day | CUTANEOUS | Status: DC
Start: 2023-09-11 — End: 2023-09-13
  Administered 2023-09-11 – 2023-09-12 (×2): 6 via TOPICAL

## 2023-09-10 MED ORDER — FENTANYL CITRATE PF 50 MCG/ML IJ SOSY
12.5000 ug | PREFILLED_SYRINGE | INTRAMUSCULAR | Status: DC | PRN
  Administered 2023-09-11: 25 ug via INTRAVENOUS
  Administered 2023-09-11: 50 ug via INTRAVENOUS
  Administered 2023-09-11: 25 ug via INTRAVENOUS
  Filled 2023-09-10 (×3): qty 1

## 2023-09-10 NOTE — H&P (Signed)
[name]:[id number] DOB: [birth year] DOA: 09/10/2023    PCP: Lewis Moccasin, MD   Outpatient Specialists: E    Oncology at [facility] Also follows with Dr. Truett Perna locally GI  Dr.  Shearon Stalls) Dr. Orvan Falconer    Patient arrived to ER on 09/10/23 at 1550 Referred by Attending Glyn Ade, MD   Patient coming from:    home Lives  With family    Chief Complaint:   Chief Complaint  Patient presents with   Weakness    HPI: [name] is a 77 y.o. female with medical history significant of Rectal cancer , upper Gi bleed, hypertension, diabetes, hypothyroidism, chronic wounds, neuropathy, chronic anemia    Presented with  fatigue Come is with fatigue Hx of rectal cancer chemo at [facility] No longer on chemo or Radiation  Coming in with dark tarry out put Hg has declined to 7 few days ago  Was dc to home  Now HG down to 3.9  No blood in rectal vault Followed by [facility]   Per family patient has been a bit more sedated they felt it was secondary to multiple pain medications  Denies significant ETOH intake   Does not smoke   Lab Results  Component Value Date   SARSCOV2NAA POSITIVE (A) 11/29/2021   SARSCOV2NAA NEGATIVE 11/07/2021   SARSCOV2NAA NEGATIVE 09/05/2021   SARSCOV2NAA NEGATIVE 08/27/2021       Regarding pertinent Chronic problems:    Hyperlipidemia -  on statins Lipitor (atorvastatin)  Lipid Panel     Component Value Date/Time   CHOL 128 10/01/2020 0400   TRIG 283 (H) 10/01/2020 0400   HDL 22 (L) 10/01/2020 0400   CHOLHDL 5.8 10/01/2020 0400   VLDL 57 (H) 10/01/2020 0400   LDLCALC 49 10/01/2020 0400      chronic CHF diastolic  last echo  Recent Results (from the past 81191 hour(s))  ECHOCARDIOGRAM COMPLETE   Collection Time: 10/01/20 12:09 PM  Result Value   Weight 3,488.56   Height 65   BP 150/78   S' Lateral 2.50   Area-P 1/2 3.42   Narrative      ECHOCARDIOGRAM REPORT    axis. IMPRESSIONS    1. Left ventricular  ejection fraction, by estimation, is 60 to 65%. The left ventricle has normal function. The left ventricle has no regional wall motion abnormalities. Left ventricular diastolic parameters are consistent with Grade I diastolic  dysfunction (impaired relaxation).  2. Right ventricular systolic function is normal. The right ventricular size is normal.  3. The mitral valve is normal in structure. No evidence of mitral valve regurgitation. No evidence of mitral stenosis.  4. The aortic valve is tricuspid. Aortic valve regurgitation is not visualized. Mild aortic valve sclerosis is present, with no evidence of aortic valve stenosis.  5. The inferior vena cava is normal in size with greater than 50% respiratory variability, suggesting right atrial pressure of 3 mmHg.  Comparison(s): No significant change from prior study. Prior images reviewed side by side.  Conclusion(s)/Recommendation(s): No intracardiac source of embolism detected on this transthoracic study. A transesophageal echocardiogram is recommended to exclude cardiac source of embo            DM 2 -  Lab Results  Component Value Date   HGBA1C 7.7 (H) 08/26/2021   on insulin, PO meds only, diet controlled   Hypothyroidism:   Lab Results  Component Value Date   TSH 16.287 (H) 08/25/2021   on  synthroid   Hx of CVA -  with/out residual deficits on  Plavix     Dementia - mild    Chronic anemia - baseline hg Hemoglobin & Hematocrit  Recent Labs    09/05/23 1850 09/10/23 1647 09/10/23 1814  HGB 7.4* 3.9* 3.8*   Iron/TIBC/Ferritin/ %Sat    Component Value Date/Time   IRON 57 08/26/2021 1443   TIBC 250 08/26/2021 1443   FERRITIN 455 (H) 11/30/2021 0653   IRONPCTSAT 23 08/26/2021 1443     Cancer: Rectal cancer followed at [facility] status post chemoradiation therapy now colectomy     While in ER:   Found to have hemoglobin down to 3.8 Started on 3 unit transfusion [facility] was consulted recommending Protonix due to shortage had  to start Protonix 80 mg IV twice daily  Lab Orders         CBC with Differential         Comprehensive metabolic panel         Urinalysis, Routine w reflex microscopic -Urine, Clean Catch         Hemoglobin and hematocrit, blood         POC occult blood, ED         POC occult blood, ED      CT HEAD   NON acute   CXR -  NON acute    Following Medications were ordered in ER: Medications  pantoprazole (PROTONIX) injection 80 mg (has no administration in time range)  0.9 %  sodium chloride infusion (Manually program via Guardrails IV Fluids) (has no administration in time range)  lactated ringers bolus 1,000 mL (0 mLs Intravenous Stopped 09/10/23 2025)  0.9 %  sodium chloride infusion (Manually program via Guardrails IV Fluids) (0 mLs Intravenous Paused 09/10/23 2059)  pantoprazole (PROTONIX) injection 40 mg (40 mg Intravenous Given 09/10/23 2058)  pantoprazole (PROTONIX) injection 40 mg (40 mg Intravenous Given 09/10/23 2124)    _______________________________________________________ ER Provider Called:   [facility]    Dr. Marina Goodell They Recommend admit to medicine   Will see in AM        ED Triage Vitals  Encounter Vitals Group     BP 09/10/23 1603 (!) 196/105     Systolic BP Percentile --      Diastolic BP Percentile --      Pulse Rate 09/10/23 1558 76     Resp 09/10/23 1558 17     Temp 09/10/23 1558 98.4 F (36.9 C)     Temp Source 09/10/23 1558 Oral     SpO2 09/10/23 1558 99 %     Weight 09/10/23 2003 145 lb (65.8 kg)     Height 09/10/23 2003 5' (1.524 m)     Head Circumference --      Peak Flow --      Pain Score 09/10/23 2003 0     Pain Loc --      Pain Education --      Exclude from Growth Chart --   ZOXW(96)@     _________________________________________ Significant initial  Findings: Abnormal Labs Reviewed  CBC WITH DIFFERENTIAL/PLATELET - Abnormal; Notable for the following components:      Result Value   RBC 1.45 (*)    Hemoglobin 3.9 (*)    HCT 13.6 (*)     MCHC 28.7 (*)    RDW 16.9 (*)    All other components within normal limits  COMPREHENSIVE METABOLIC PANEL - Abnormal; Notable for the following components:  CO2 21 (*)    Glucose, Bld 156 (*)    BUN 43 (*)    Creatinine, Ser 1.20 (*)    Calcium 8.1 (*)    Total Protein 5.5 (*)    Albumin 3.0 (*)    GFR, Estimated 47 (*)    All other components within normal limits  URINALYSIS, ROUTINE W REFLEX MICROSCOPIC - Abnormal; Notable for the following components:   Color, Urine COLORLESS (*)    All other components within normal limits  HEMOGLOBIN AND HEMATOCRIT, BLOOD - Abnormal; Notable for the following components:   Hemoglobin 3.8 (*)    HCT 13.5 (*)    All other components within normal limits  POC OCCULT BLOOD, ED - Abnormal; Notable for the following components:   Fecal Occult Bld POSITIVE (*)    All other components within normal limits      _________________________ Troponin  ordered Cardiac Panel (last 3 results) Recent Labs    09/10/23 1647  TROPONINIHS 12     ECG: Ordered Personally reviewed and interpreted by me showing: HR : 78 Rhythm: Accelerated junctional rhythm Nonspecific T abnormalities, diffuse leads Prolonged QT interval QTC 664  BNP (last 3 results) Recent Labs    04/22/23 1854  BNP 103.4*     COVID-19 Labs  No results for input(s): "DDIMER", "FERRITIN", "LDH", "CRP" in the last 72 hours.  Lab Results  Component Value Date   SARSCOV2NAA POSITIVE (A) 11/29/2021   SARSCOV2NAA NEGATIVE 11/07/2021   SARSCOV2NAA NEGATIVE 09/05/2021   SARSCOV2NAA NEGATIVE 08/27/2021    ____________________   The recent clinical data is shown below. Vitals:   09/10/23 1928 09/10/23 2003 09/10/23 2058 09/10/23 2116  BP: (!) 159/40 (!) 146/48 (!) 123/33 (!) 128/40  Pulse: 72 70 65 65  Resp: 18 14 12 14   Temp: 98.5 F (36.9 C) 98.6 F (37 C) 98.4 F (36.9 C) 98.6 F (37 C)  TempSrc: Oral Oral  Oral  SpO2: 93% 100% 99% 100%  Weight:  65.8 kg    Height:   5' (1.524 m)      WBC     Component Value Date/Time   WBC 8.4 09/10/2023 1647   LYMPHSABS 1.1 09/10/2023 1647   MONOABS 0.6 09/10/2023 1647   EOSABS 0.0 09/10/2023 1647   BASOSABS 0.0 09/10/2023 1647       UA   no evidence of UTI      Urine analysis:    Component Value Date/Time   COLORURINE COLORLESS (A) 09/10/2023 1944   APPEARANCEUR CLEAR 09/10/2023 1944   LABSPEC 1.011 09/10/2023 1944   PHURINE 6.0 09/10/2023 1944   GLUCOSEU NEGATIVE 09/10/2023 1944   HGBUR NEGATIVE 09/10/2023 1944   BILIRUBINUR NEGATIVE 09/10/2023 1944   KETONESUR NEGATIVE 09/10/2023 1944   PROTEINUR NEGATIVE 09/10/2023 1944   UROBILINOGEN 0.2 05/27/2014 1203   NITRITE NEGATIVE 09/10/2023 1944   LEUKOCYTESUR NEGATIVE 09/10/2023 1944    Results for orders placed or performed during the hospital encounter of 09/05/23  Urine Culture     Status: Abnormal   Collection Time: 09/05/23  7:53 PM   Specimen: Urine, Clean Catch  Result Value Ref Range Status   Specimen Description   Final    URINE, CLEAN CATCH Performed at [facility], [address], [place], [place] [address]    Special Requests   Final    NONE Performed at [facility], [address] [place], [place] [address]    Culture MULTIPLE SPECIES PRESENT, SUGGEST RECOLLECTION (A)  Final   Report Status 09/07/2023 FINAL  Final     __________________________________________________________ Recent Labs  Lab 09/05/23 1850 09/10/23 1647  NA 136 135  K 4.7 4.4  CO2 23 21*  GLUCOSE 178* 156*  BUN 49* 43*  CREATININE 0.87 1.20*  CALCIUM 8.6* 8.1*    Cr    Up from baseline see below Lab Results  Component Value Date   CREATININE 1.20 (H) 09/10/2023   CREATININE 0.87 09/05/2023   CREATININE 0.79 07/24/2023    Recent Labs  Lab 09/10/23 1647  AST 15  ALT 10  ALKPHOS 98  BILITOT 0.5  PROT 5.5*  ALBUMIN 3.0*   Lab Results  Component Value Date   CALCIUM 8.1 (L) 09/10/2023   PHOS 3.7  11/30/2021    Plt: Lab Results  Component Value Date   PLT 200 09/10/2023    Recent Labs  Lab 09/05/23 1850 09/10/23 1647 09/10/23 1814  WBC 8.4 8.4  --   NEUTROABS 6.5 6.6  --   HGB 7.4* 3.9* 3.8*  HCT 25.1* 13.6* 13.5*  MCV 92.6 93.8  --   PLT 188 200  --     HG/HCT   Down  from baseline see below    Component Value Date/Time   HGB 3.8 (LL) 09/10/2023 1814   HGB 9.6 (L) 07/24/2023 1050   HCT 13.5 (L) 09/10/2023 1814   MCV 93.8 09/10/2023 1647    _______________________________________________ Hospitalist was called for admission for symptomatic anemia   The following Work up has been ordered so far:  Orders Placed This Encounter  Procedures   Critical Care   DG Chest Portable 1 View   CT HEAD WO CONTRAST ( )   CBC with Differential   Comprehensive metabolic panel   Urinalysis, Routine w reflex microscopic -Urine, Clean Catch   Hemoglobin and hematocrit, blood   In and Out Cath   Informed Consent Details: Physician/Practitioner Attestation; Transcribe to consent form and obtain patient signature   Consult to gastroenterology   Consult to hospitalist   POC occult blood, ED   POC occult blood, ED   ED EKG   EKG 12-Lead   Type and screen [facility]   Prepare RBC (crossmatch)   Prepare RBC (crossmatch)     OTHER Significant initial  Findings:     Cultures:    Component Value Date/Time   SDES  09/05/2023 1953    URINE, CLEAN CATCH Performed at [facility], [address], [place], [place] [address]    SPECREQUEST  09/05/2023 1953    NONE Performed at [facility], [address], [place], [place] [address]    CULT MULTIPLE SPECIES PRESENT, SUGGEST RECOLLECTION (A) 09/05/2023 1953   REPTSTATUS 09/07/2023 FINAL 09/05/2023 1953     Radiological Exams on Admission: CT HEAD WO CONTRAST ( )  Result Date: 09/10/2023 CLINICAL DATA:  Altered mental status. EXAM: CT HEAD WITHOUT CONTRAST  TECHNIQUE: Contiguous axial images were obtained from the base of the skull through the vertex without intravenous contrast. RADIATION DOSE REDUCTION: This exam was performed according to the departmental dose-optimization program which includes automated exposure control, adjustment of the mA and/or kV according to patient size and/or use of iterative reconstruction technique. COMPARISON:  CT scan head from 05/21/2023. FINDINGS: Brain: No evidence of acute infarction, hemorrhage, hydrocephalus, extra-axial collection or mass lesion/mass effect. Old lacunar infarct noted in the left corona radiata. There is bilateral periventricular hypodensity, which is non-specific but most likely seen in the  settings of microvascular ischemic changes. Mild in extent. Otherwise normal appearance of brain parenchyma. Ventricles are normal. Cerebral volume is age appropriate. Vascular: No hyperdense vessel or unexpected calcification. Intracranial arteriosclerosis. Skull: Normal. Negative for fracture or focal lesion. Sinuses/Orbits: No acute finding. Other: Visualized mastoid air cells are unremarkable. No mastoid effusion. IMPRESSION: *No acute intracranial abnormality. Electronically Signed   By: Jules Schick M.D.   On: 09/10/2023 17:49   DG Chest Portable 1 View  Result Date: 09/10/2023 CLINICAL DATA:  Chest muscle spasm and pain. EXAM: PORTABLE CHEST 1 VIEW COMPARISON:  05/21/2023. FINDINGS: Bilateral lung fields are clear. Mild prominence of interstitial markings is similar to the prior study. No overt pulmonary edema. Bilateral costophrenic angles are clear. Stable cardio-mediastinal silhouette. No acute osseous abnormalities. The soft tissues are within normal limits. Right-sided CT Port-A-Cath is again seen with its tip overlying the lower portion of superior vena cava. IMPRESSION: *No acute cardiopulmonary abnormality. Electronically Signed   By: Jules Schick M.D.   On: 09/10/2023 17:46    _______________________________________________________________________________________________________ Latest  Blood pressure (!) 128/40, pulse 65, temperature 98.6 F (37 C), temperature source Oral, resp. rate 14, height 5' (1.524 m), weight 65.8 kg, SpO2 100%.   Vitals  labs and radiology finding personally reviewed  Review of Systems:    Pertinent positives include:  melena fatigue,  Constitutional:  No weight loss, night sweats, Fevers, chills, weight loss  HEENT:  No headaches, Difficulty swallowing,Tooth/dental problems,Sore throat,  No sneezing, itching, ear ache, nasal congestion, post nasal drip,  Cardio-vascular:  No chest pain, Orthopnea, PND, anasarca, dizziness, palpitations.no Bilateral lower extremity swelling  GI:  No heartburn, indigestion, abdominal pain, nausea, vomiting, diarrhea, change in bowel habits, loss of appetite, melena, blood in stool, hematemesis Resp:  no shortness of breath at rest. No dyspnea on exertion, No excess mucus, no productive cough, No non-productive cough, No coughing up of blood.No change in color of mucus.No wheezing. Skin:  no rash or lesions. No jaundice GU:  no dysuria, change in color of urine, no urgency or frequency. No straining to urinate.  No flank pain.  Musculoskeletal:  No joint pain or no joint swelling. No decreased range of motion. No back pain.  Psych:  No change in mood or affect. No depression or anxiety. No memory loss.  Neuro: no localizing neurological complaints, no tingling, no weakness, no double vision, no gait abnormality, no slurred speech, no confusion  All systems reviewed and apart from HOPI all are negative _______________________________________________________________________________________________ Past Medical History:   Past Medical History:  Diagnosis Date   Arthritis    Basal ganglia stroke (HCC)    March 2022   Bilateral lower extremity edema    Cancer (HCC)    twice- colon cancer,  unsure of the other type of cancer   Carotid artery occlusion    Flank pain    Full dentures    History of cervical cancer    11/ 1994  Stage IB  s/p  high dose radiation brachytherapy @ [facility] 01/ 1995--- per pt no recurrence   History of chronic bronchitis    History of hyperthyroidism    d 03/ 2011---due to grave's disease--- s/p  RAI treatment 04/ 2011   History of kidney stones    History of sepsis 05/03/2018   due to UTI with klebsiella/ pyelonephritis/ ureteral obstruction cause by stone   Hyperlipidemia    Hypothyroidism, postradioiodine therapy    endocrinologist--  dr Everardo All--  dx graves disease and s/p RAI  i131 treatement 4/ 2011   Nauseated    Oral thrush 05/03/2018   Pneumonia    Type 2 diabetes mellitus treated with insulin (HCC)    FOLLOWED BY PCP   Urgency of urination    Wears glasses      Past Surgical History:  Procedure Laterality Date   APPENDECTOMY     BIOPSY  01/23/2021   Procedure: BIOPSY;  Surgeon: Napoleon Form, MD;  Location: [facility];  Service: Endoscopy;;   BIOPSY  10/12/2021   Procedure: BIOPSY;  Surgeon: Tressia Danas, MD;  Location: [facility];  Service: Gastroenterology;;   CAROTID ENDARTERECTOMY Right 03/28/2020   CATARACT EXTRACTION W/ INTRAOCULAR LENS  IMPLANT, BILATERAL  2004  approx.   COLONOSCOPY     COLONOSCOPY WITH PROPOFOL N/A 01/23/2021   Procedure: COLONOSCOPY WITH PROPOFOL;  Surgeon: Napoleon Form, MD;  Location: [facility];  Service: Endoscopy;  Laterality: N/A;   CYSTOSCOPY WITH STENT PLACEMENT Right 05/04/2018   Procedure: CYSTOSCOPY WITH STENT PLACEMENT AND RETROGRADE PYELOGRAM;  Surgeon: Rene Paci, MD;  Location: [facility];  Service: Urology;  Laterality: Right;   CYSTOSCOPY/URETEROSCOPY/HOLMIUM LASER/STENT PLACEMENT Right 05/19/2018   Procedure: CYSTOSCOPY, URETEROSCOPY/HOLMIUM LASER, STONE BASKETRY/ STENT EXCHANGE;  Surgeon: Rene Paci, MD;  [facility];  Service: Urology;  Laterality: Right;   ENDARTERECTOMY Right 03/28/2020   Procedure: RIGHT CAROTID ENDARTERECTOMY with PATCH ANGIOPLASTY;  Surgeon: Larina Earthly, MD;  Location: [facility];  Service: Vascular;  Laterality: Right;   ESOPHAGOGASTRODUODENOSCOPY (EGD) WITH PROPOFOL N/A 10/12/2021   Procedure: ESOPHAGOGASTRODUODENOSCOPY (EGD) WITH PROPOFOL;  Surgeon: Tressia Danas, MD;  Location: [facility];  Service: Gastroenterology;  Laterality: N/A;   EXCISION MASS LEFT CHEST WALL  09-20-2007   dr Janee Morn  [facility]   Radioactive Iodine Therapy     for thyroid   REVISION TOTAL HIP ARTHROPLASTY Left early 2000s   SUBMUCOSAL TATTOO INJECTION  01/23/2021   Procedure: SUBMUCOSAL TATTOO INJECTION;  Surgeon: Napoleon Form, MD;  Location: [facility];  Service: Endoscopy;;   TANDEM RING INSERTION  1995   dr Loree Fee @ [facility]   EUA w/ tandem placement in ovid  (for direct high dose radiation brachytherapy , cervical cancer)   TOTAL HIP ARTHROPLASTY Left 1990s   TRANSTHORACIC ECHOCARDIOGRAM  08/07/2017   mild focal basal hypertrophy of the septum,  ef 60-65%, grade 1 diastolic dysfunction/  atrial septum with lipomatous hypertrophy/  trivial PR   UPPER GASTROINTESTINAL ENDOSCOPY      Social History:  Ambulatory  walker     reports that she quit smoking about 41 years ago. Her smoking use included cigarettes. She started smoking about 46 years ago. She has been exposed to tobacco smoke. She has never used smokeless tobacco. She reports that she does not drink alcohol and does not use drugs.   Family History:   Family History  Problem Relation Age of Onset   Emphysema Father    Diabetes Father    Emphysema Sister    Emphysema Brother    Cancer Mother        Skin Cancer   Diabetes Mother    Colon cancer Neg Hx    Esophageal cancer Neg Hx    Rectal cancer Neg Hx    Stomach cancer Neg Hx     ______________________________________________________________________________________________ Allergies: Allergies  Allergen Reactions   Codeine Nausea Only     Prior to Admission medications   Medication Sig Start Date End Date Taking? Authorizing Provider  ascorbic acid (  VITAMIN C) 500 MG tablet Take 1 tablet (500 mg total) by mouth daily. 12/03/21   Rolly Salter, MD  aspirin 81 MG EC tablet Take 1 tablet (81 mg total) by mouth daily. RESUME AFTER 1 WEEK 09/03/21   Osvaldo Shipper, MD  atorvastatin (LIPITOR) 40 MG tablet Take 40 mg by mouth every morning. 12/15/19   [provider]  baclofen (LIORESAL) 10 MG tablet Take 10 mg by mouth 4 (four) times daily as needed for muscle spasms. 03/22/21   [provider]  cephALEXin (KEFLEX) 500 MG capsule Take 500 mg by mouth 4 (four) times daily. 07/13/23   [provider]  clopidogrel (PLAVIX) 75 MG tablet Take 1 tablet (75 mg total) by mouth at bedtime. 10/12/21   Willeen Niece, MD  docusate sodium (COLACE) 100 MG capsule Take 100 mg by mouth 2 (two) times daily.    [provider]  feeding supplement, GLUCERNA SHAKE, (GLUCERNA SHAKE) LIQD Take 237 mLs by mouth 3 (three) times daily between meals. 12/02/21   Rolly Salter, MD  furosemide (LASIX) 20 MG tablet Take 20 mg by mouth daily. 11/19/21   [provider]  gabapentin (NEURONTIN) 300 MG capsule Take 300-600 mg by mouth See admin instructions. Take one capsule (300 mg) by mouth every morning and two capsules (600 mg) at night    [provider]  gemfibrozil (LOPID) 600 MG tablet Take 600 mg by mouth 2 (two) times daily.    [provider]  insulin degludec (TRESIBA FLEXTOUCH) 100 UNIT/ML FlexTouch Pen Inject 10 Units into the skin daily. 08/28/21   Osvaldo Shipper, MD  levothyroxine (SYNTHROID) 125 MCG tablet Take 1 tablet (125 mcg total) by mouth daily at 6 (six) AM. 08/29/21 07/24/23  Osvaldo Shipper, MD  MAGnesium-Oxide 400  (240 Mg) MG tablet Take 1 tablet by mouth twice daily 08/10/23   Ladene Artist, MD  melatonin 5 MG TABS Take 1 tablet (5 mg total) by mouth at bedtime. 12/02/21   Rolly Salter, MD  metFORMIN (GLUCOPHAGE) 500 MG tablet Take 500 mg by mouth 2 (two) times daily. 02/01/21   [provider]  methocarbamol (ROBAXIN) 500 MG tablet Take 1 tablet (500 mg total) by mouth 2 (two) times daily. 05/21/23   Arby Barrette, MD  Multiple Vitamin (MULTIVITAMIN WITH MINERALS) TABS tablet Take 1 tablet by mouth daily. 12/03/21   Rolly Salter, MD  nystatin (MYCOSTATIN/NYSTOP) powder Apply 1 Application topically 2 (two) times daily. To groin rash 07/24/23   Ladene Artist, MD  ondansetron (ZOFRAN) 8 MG tablet Take 1 tablet (8 mg total) by mouth every 6 (six) hours as needed for nausea or vomiting. Patient not taking: Reported on 02/02/2023 10/31/21   Esterwood, Amy S, PA-C  pantoprazole (PROTONIX) 40 MG tablet Take 1 tablet by mouth twice daily 12/15/22   Tressia Danas, MD  polyethylene glycol powder (GLYCOLAX/MIRALAX) 17 GM/SCOOP powder Take 17 g by mouth daily.    [provider]  prochlorperazine (COMPAZINE) 10 MG tablet Take 10 mg by mouth every 6 (six) hours as needed for nausea or vomiting. Patient not taking: Reported on 02/02/2023    [provider]  sertraline (ZOLOFT) 50 MG tablet Take 50 mg by mouth daily.    [provider]  triamcinolone cream (KENALOG) 0.1 % Apply 1 application  topically 2 (two) times daily as needed (groin rash). Mix with nystatin cream Patient not taking: Reported on 02/02/2023 09/19/20   [provider]  Marlowe Kays  ER 9 MG C12A Take 1 capsule by mouth 2 (two) times daily. 10/31/21   Sammuel Cooper, PA-C    ___________________________________________________________________________________________________ Physical Exam:    09/10/2023    9:16 PM 09/10/2023    8:58 PM 09/10/2023    8:03 PM  Vitals with BMI  Height   5\' 0"   Weight    145 lbs  BMI   28.32  Systolic 128 123 161  Diastolic 40 33 48  Pulse 65 65 70    1. General:  in No  Acute distress   Chronically ill   -appearing 2. Psychological: Alert and   Oriented 3. Head/ENT:    Dry Mucous Membranes                          Head Non traumatic, neck supple                         Poor Dentition 4. SKIN:  decreased Skin turgor,  Skin clean Dry and intact no rash   pale 5. Heart: Regular rate and rhythm no  Murmur, no Rub or gallop 6. Lungs: , no wheezes or crackles   7. Abdomen: Soft,  non-tender, Non distended   obese  bowel sounds present Colostomy  Dark stool pressent  8. Lower extremities: no clubbing, cyanosis,    edema Rihgt >left 9. Neurologically Grossly intact, moving all 4 extremities equally   Chart has been reviewed  ______________________________________________________________________________________________  Assessment/Plan 77 y.o. female with medical history significant of Rectal cancer , upper Gi bleed, hypertension, diabetes, hypothyroidism, chronic wounds, neuropathy, chronic anemia  Admitted for  symptomatic anemia and upper gi bleed     Present on Admission:  Upper GI bleed  Hypothyroidism  Essential hypertension  Hyperlipidemia associated with type 2 diabetes mellitus (HCC)  Uncontrolled type 2 diabetes mellitus with hypoglycemia and hyperglycemia (HCC)  Bilateral lower extremity edema  Iron deficiency anemia due to chronic blood loss  Symptomatic anemia  Rectal cancer (HCC)  Acute encephalopathy   Upper GI bleed  - Glasgow Blatchford score BUN >18.2 Hg  <60F  ,       Modifying risk factors include:     NSAIDS use hx of PUD on Plavix      Admit to stepdown given above    -  ER  Provider spoke to gastroenterology (  [facility]) they will see patient in a.m. appreciate their consult   - serial CBC.    - Monitor for any recurrence,  evidence of hemodynamic instability or significant blood loss  - Transfuse as needed for hemoglobin  below 7 or evidence of life-threatening bleeding  - Establish at least 2 PIV and fluid resuscitate   - clear liquids for tonight keep nothing by mouth post midnight,   -  administer Protonix   80 mg twice a day      Hypothyroidism - Check TSH continue home medications Synthroid at  po q day   Essential hypertension Allow permissive hypertension for tonight  Hyperlipidemia associated with type 2 diabetes mellitus (HCC) Chronic resume Lipitor when able to tolerate  Uncontrolled type 2 diabetes mellitus with hypoglycemia and hyperglycemia (HCC)  - Order Sensitive  SSI   - continue home insulin but decreased to  5units,  -  check TSH and HgA1C  - Hold by mouth medications    Bilateral lower extremity edema Now patient seems to have more of right lower  extremity edema obtain Dopplers  History of CVA (cerebrovascular accident) Hold Plavix  Iron deficiency anemia due to chronic blood loss Acute on chronic secondary to upper GI bleed.  Will transfuse 3 units  Symptomatic anemia Transfuse 3 units appreciate [facility] consult most likely upper GI bleed Follow serial CBC  Rectal cancer (HCC) Followed at [facility] now status post colectomy No blood in rectum source is likely upper Patient to follow-up with her regular oncologist.  No longer receiving chemoradiation therapy secondary to decompensation  Acute encephalopathy Likely secondary to multiple medications polypharmacy as well as chronic anemia getting worsened.  Will transfuse hold home meds for tonight   Other plan as per orders.  DVT prophylaxis:  SCD      Code Status:    Code Status: Prior FULL CODE   as per patient   I had personally discussed CODE STATUS with patient   ACP   none     Family Communication:   Family not at  Bedside    Diet  npo   Disposition Plan:         To home once workup is complete and patient is stable   Following barriers for discharge:                          Anemia corrected h/H  stable                                                      Will need consultants to evaluate patient prior to discharge       Consult Orders  (From admission, onward)           Start     Ordered   09/11/23 2304  Nutritional services consult  Once       Provider:  (Not yet assigned)  Question:  Reason for Consult?  Answer:  assess nutrtional status   09/10/23 2305   09/10/23 2116  Consult to hospitalist  Once       Provider:  (Not yet assigned)  Question Answer Comment  Place call to: [facility]   Reason for Consult Admit      09/10/23 2115              Consults called: [facility] gi  Email dr. Alcide Evener that pt ahs been admitted Admission status:  ED Disposition     ED Disposition  Admit   Condition  --   Comment  Hospital Area: [facility] [[id number]]  Level of Care: Stepdown [14]  Admit to SDU based on following criteria: Hemodynamic compromise or significant risk of instability:  Patient requiring short term acute titration and management of vasoactive drips, and invasive monitoring (i.e., CVP and Arterial line).  May admit patient to [facility] or [facility] if equivalent level of care is available:: No  Covid Evaluation: Asymptomatic - no recent exposure (last 10 days) testing not required  Diagnosis: Upper GI bleed [[id number]]  Admitting Physician: Therisa Doyne [[id number]]  Attending Physician: Therisa Doyne [[id number]]  Certification:: I certify this patient will need inpatient services for at least 2 midnights  Expected Medical Readiness: 09/12/2023            inpatient     I Expect 2 midnight stay secondary to severity of patient's current  illness need for inpatient interventions justified by the following:     Severe lab/radiological/exam abnormalities including:    anemia and extensive comorbidities including:  DM2    CHF history of stroke with residual deficits    malignancy,   That are currently affecting medical  management.   I expect  patient to be hospitalized for 2 midnights requiring inpatient medical care.  Patient is at high risk for adverse outcome (such as loss of life or disability) if not treated.  Indication for inpatient stay as follows:    Need for operative/procedural  intervention     Need for IV protonix    Level of care   stepdown   tele indefinitely please discontinue once patient no longer qualifies COVID-19 Labs   Tamma Brigandi 09/10/2023, 11:30 PM    [facility]     after 2 AM please page floor coverage PA If 7AM-7PM, please contact the day team taking care of the patient using [email or web address]

## 2023-09-10 NOTE — Assessment & Plan Note (Addendum)
Gently rehydrate follow fluid status Obtain urine electrolytes

## 2023-09-10 NOTE — Assessment & Plan Note (Signed)
-   Check TSH continue home medications Synthroid at  125 mcg po q day  

## 2023-09-10 NOTE — Assessment & Plan Note (Signed)
-   Glasgow Blatchford score BUN >18.2 Hg  <58F  ,       Modifying risk factors include:     NSAIDS use hx of PUD on Plavix      Admit to stepdown given above    -  ER  Provider spoke to gastroenterology (  [facility]) they will see patient in a.m. appreciate their consult   - serial CBC.    - Monitor for any recurrence,  evidence of hemodynamic instability or significant blood loss  - Transfuse as needed for hemoglobin below 7 or evidence of life-threatening bleeding  - Establish at least 2 PIV and fluid resuscitate   - clear liquids for tonight keep nothing by mouth post midnight,   -  administer Protonix   80 mg twice a day

## 2023-09-10 NOTE — Assessment & Plan Note (Signed)
Chronic resume Lipitor when able to tolerate

## 2023-09-10 NOTE — Assessment & Plan Note (Signed)
Hold Plavix

## 2023-09-10 NOTE — ED Notes (Signed)
Unable to collect urine through in and out cath.

## 2023-09-10 NOTE — Subjective & Objective (Signed)
Come is with fatigue Hx of rectal cancer chemo at [facility] ostomy No longer on chemo or Radiation  Coming in with dark tarry out put Hg has declined to 7 few days ago  Was dc to home  Now HG down to 3.9  No blood in rectal vault Followed by [facility]

## 2023-09-10 NOTE — ED Provider Notes (Addendum)
[place] EM[facility]GENCY DEPARTMENT AT [facility] Provider Note   CSN: [id number] Arrival date & time: 09/10/23  1550     History Chief Complaint  Patient presents with   Weakness    HPI [name] is a 77 y.o. female presenting for recurrent altered mental status.  Seen here 5 days ago for the same presentation.  Was observed in the emergency department for 6 hours and mental status returned to baseline. Patient provide no history on arrival here, states that she is too tired to talk and can barely stay awake during my conversation.  EMS stated that the patient had called them out earlier in the day for leg pain and then refused transfer, patient's daughter got to the house and called EMS back because she noticed that the patient was now confused and somnolent.  I called patient's daughter for clarification: She states that the patient has chronic leg pain and frequently takes her medications when her legs get worse.  Her current medication list is Xtampza 9 mg twice daily, gabapentin 300 mg twice daily, baclofen 20 mg twice daily. Patient's family thinks that after she refused transfer to the emergency room, she took all of her pain medications and was then somnolent by the time the patient's daughter arrived to the house. Continues to be mildly somnolent here.  Anytime she is woken she endorses severe leg pain.  It is diffuse in nature there is no obvious abnormality.  Per chart review she has extensive history of this leg pain.  Mx of rectal/cervical cancer   Hypothyroid with recent change in medications: She has had severe chronic pain. XTAMPCA 9mg  Gabapentin 300mg  BID Baclofen 10MG   Patient's recorded medical, surgical, social, medication list and allergies were reviewed in the Snapshot window as part of the initial history.   Review of Systems   Review of Systems  Constitutional:  Positive for fatigue. Negative for chills and fever.  HENT:  Negative for ear  pain and sore throat.   Eyes:  Negative for pain and visual disturbance.  Respiratory:  Negative for cough and shortness of breath.   Cardiovascular:  Negative for chest pain and palpitations.  Gastrointestinal:  Positive for blood in stool. Negative for abdominal pain and vomiting.  Genitourinary:  Negative for dysuria and hematuria.  Musculoskeletal:  Negative for arthralgias and back pain.  Skin:  Negative for color change and rash.  Neurological:  Positive for weakness. Negative for seizures and syncope.  All other systems reviewed and are negative.   Physical Exam Updated Vital Signs BP (!) 128/40   Pulse 65   Temp 98.6 F (37 C) (Oral)   Resp 14   Ht 5' (1.524 m)   Wt 65.8 kg   SpO2 100%   BMI 28.32 kg/m  Physical Exam Vitals and nursing note reviewed.  Constitutional:      General: She is not in acute distress.    Appearance: She is well-developed.  HENT:     Head: Normocephalic and atraumatic.  Eyes:     Conjunctiva/sclera: Conjunctivae normal.  Cardiovascular:     Rate and Rhythm: Normal rate and regular rhythm.     Heart sounds: No murmur heard. Pulmonary:     Effort: Pulmonary effort is normal. No respiratory distress.     Breath sounds: Normal breath sounds.  Abdominal:     General: There is no distension.     Palpations: Abdomen is soft.     Tenderness: There is no abdominal tenderness.  There is no right CVA tenderness or left CVA tenderness.  Musculoskeletal:        General: No swelling, tenderness, deformity or signs of injury. Normal range of motion.     Cervical back: Neck supple.  Skin:    General: Skin is warm and dry.  Neurological:     General: No focal deficit present.     Mental Status: She is alert and oriented to person, place, and time. Mental status is at baseline.     Cranial Nerves: No cranial nerve deficit.      ED Course/ Medical Decision Making/ A&P    Procedures .Critical Care  Performed by: Glyn Ade,  MD Authorized by: Glyn Ade, MD   Critical care provider statement:    Critical care time (minutes):  95   Critical care was necessary to treat or prevent imminent or life-threatening deterioration of the following conditions:  Circulatory failure   Critical care was time spent personally by me on the following activities:  Development of treatment plan with patient or surrogate, discussions with consultants, evaluation of patient's response to treatment, examination of patient, ordering and review of laboratory studies, ordering and review of radiographic studies, ordering and performing treatments and interventions, pulse oximetry, re-evaluation of patient's condition and review of old charts   Care discussed with: admitting provider      Medications Ordered in ED Medications  pantoprazole (PROTONIX) injection 80 mg (has no administration in time range)  pantoprazole (PROTONIX) injection 40 mg (has no administration in time range)  0.9 %  sodium chloride infusion (Manually program via Guardrails IV Fluids) (has no administration in time range)  lactated ringers bolus 1,000 mL (0 mLs Intravenous Stopped 09/10/23 2025)  0.9 %  sodium chloride infusion (Manually program via Guardrails IV Fluids) (0 mLs Intravenous Paused 09/10/23 2059)  pantoprazole (PROTONIX) injection 40 mg (40 mg Intravenous Given 09/10/23 2058)   Medical Decision Making:   [name] is a 77 y.o. female who presented to the ED today with altered mental status detailed above.    Handoff received from EMS.  Additional history discussed with patient's family/caregivers.  Patient placed on continuous vitals and telemetry monitoring while in ED which was reviewed periodically.  Complete initial physical exam performed, notably the patient  was in no acute distress.  She is somnolent with pinpoint pupils requiring repeat prompting for questions.    Reviewed and confirmed nursing documentation for past medical  history, family history, social history.    Initial Assessment:   With the patient's presentation of altered mental status, most likely diagnosis is delerium 2/2 infectious etiology (UTI/CAP/URI) vs metabolic abnormality (Na/K/Mg/Ca) vs nonspecific etiology. Other diagnoses were considered including (but not limited to) CVA, ICH, intracranial mass, critical dehydration, heptatic dysfunction, uremia, hypercarbia, intoxication, endrocrine abnormality, toxidrome. These are considered less likely due to history of present illness and physical exam findings.   This is most consistent with an acute life/limb threatening illness complicated by underlying chronic conditions.  Initial Plan:  CTH to evaluate for intracranial etiology of patient's symptoms  Screening labs including CBC and Metabolic panel to evaluate for infectious or metabolic etiology of disease.  Urinalysis with reflex culture ordered to evaluate for UTI or relevant urologic/nephrologic pathology.  CXR to evaluate for structural/infectious intrathoracic pathology.  EKG to evaluate for cardiac pathology Objective evaluation as below reviewed   Initial Study Results:   Laboratory  Critical anemia 3.9  EKG EKG was reviewed independently. Rate, rhythm, axis, intervals all examined  and without medically relevant abnormality. ST segments without concerns for elevations.    Radiology:  All images reviewed independently. Agree with radiology report at this time.   CT HEAD WO CONTRAST ( )  Result Date: 09/10/2023 CLINICAL DATA:  Altered mental status. EXAM: CT HEAD WITHOUT CONTRAST TECHNIQUE: Contiguous axial images were obtained from the base of the skull through the vertex without intravenous contrast. RADIATION DOSE REDUCTION: This exam was performed according to the departmental dose-optimization program which includes automated exposure control, adjustment of the mA and/or kV according to patient size and/or use of iterative  reconstruction technique. COMPARISON:  CT scan head from 05/21/2023. FINDINGS: Brain: No evidence of acute infarction, hemorrhage, hydrocephalus, extra-axial collection or mass lesion/mass effect. Old lacunar infarct noted in the left corona radiata. There is bilateral periventricular hypodensity, which is non-specific but most likely seen in the settings of microvascular ischemic changes. Mild in extent. Otherwise normal appearance of brain parenchyma. Ventricles are normal. Cerebral volume is age appropriate. Vascular: No hyperdense vessel or unexpected calcification. Intracranial arteriosclerosis. Skull: Normal. Negative for fracture or focal lesion. Sinuses/Orbits: No acute finding. Other: Visualized mastoid air cells are unremarkable. No mastoid effusion. IMPRESSION: *No acute intracranial abnormality. Electronically Signed   By: Jules Schick M.D.   On: 09/10/2023 17:49   DG Chest Portable 1 View  Result Date: 09/10/2023 CLINICAL DATA:  Chest muscle spasm and pain. EXAM: PORTABLE CHEST 1 VIEW COMPARISON:  05/21/2023. FINDINGS: Bilateral lung fields are clear. Mild prominence of interstitial markings is similar to the prior study. No overt pulmonary edema. Bilateral costophrenic angles are clear. Stable cardio-mediastinal silhouette. No acute osseous abnormalities. The soft tissues are within normal limits. Right-sided CT Port-A-Cath is again seen with its tip overlying the lower portion of superior vena cava. IMPRESSION: *No acute cardiopulmonary abnormality. Electronically Signed   By: Jules Schick M.D.   On: 09/10/2023 17:46    Final Assessment and Plan:   Substantial anemia detected.  Patient consented to transfusion.  She wanted to reverse her prior blood product refusal. Patient started on transfusion.  Hemoccult sent to the lab.  Though no gross melena or hematochezia so less likely GI source.  More likely her anemia of chronic disease which has bothered her intermittently over the years last  requiring transfusion 2022.  I consulted GI Dr. Marina Goodell who feels that ulcerative disease is probably more likely.  They recommended continued PPI/3 units of transfusion.   Disposition:   Based on the above findings, I believe this patient is stable for admission.    Patient/family educated about specific findings on our evaluation and explained exact reasons for admission.  Patient/family educated about clinical situation and time was allowed to answer questions.   Admission team communicated with and agreed with need for admission. Patient admitted. Patient  ready to move at this time.     Emergency Department Medication Summary:   Medications  pantoprazole (PROTONIX) injection 80 mg (has no administration in time range)  pantoprazole (PROTONIX) injection 40 mg (has no administration in time range)  0.9 %  sodium chloride infusion (Manually program via Guardrails IV Fluids) (has no administration in time range)  lactated ringers bolus 1,000 mL (0 mLs Intravenous Stopped 09/10/23 2025)  0.9 %  sodium chloride infusion (Manually program via Guardrails IV Fluids) (0 mLs Intravenous Paused 09/10/23 2059)  pantoprazole (PROTONIX) injection 40 mg (40 mg Intravenous Given 09/10/23 2058)         Emergency Department Medication Summary:   Medications  pantoprazole (PROTONIX)  injection 80 mg (has no administration in time range)  pantoprazole (PROTONIX) injection 40 mg (has no administration in time range)  0.9 %  sodium chloride infusion (Manually program via Guardrails IV Fluids) (has no administration in time range)  lactated ringers bolus 1,000 mL (0 mLs Intravenous Stopped 09/10/23 2025)  0.9 %  sodium chloride infusion (Manually program via Guardrails IV Fluids) (0 mLs Intravenous Paused 09/10/23 2059)  pantoprazole (PROTONIX) injection 40 mg (40 mg Intravenous Given 09/10/23 2058)         Clinical Impression:  1. Weakness      Data Unavailable   Final Clinical Impression(s) /  ED Diagnoses Final diagnoses:  Weakness    Rx / DC Orders ED Discharge Orders     None         Glyn Ade, MD 09/10/23 2118    Glyn Ade, MD 09/10/23 2132

## 2023-09-10 NOTE — Assessment & Plan Note (Signed)
Acute on chronic secondary to upper GI bleed.  Will transfuse 3 units

## 2023-09-10 NOTE — ED Triage Notes (Signed)
Per [facility] pt c/o muscle spasms and pain not relieved from her home medication. Family concerned her pain is not being controlled. Reports mental status is off for past week and worried patient is weaker and paler than usual. Per ems pt seen here a few days ago for same complaint.

## 2023-09-10 NOTE — Telephone Encounter (Signed)
The patient's daughter contacted [place] to inform that her mother is currently at [facility] Emergency [facility]. She had to call EMS due to her mother's altered mental status and vomiting. I advised her that I would notify the provider regarding her mother's condition at the emergency department.

## 2023-09-10 NOTE — Assessment & Plan Note (Signed)
Likely secondary to multiple medications polypharmacy as well as chronic anemia getting worsened.  Will transfuse hold home meds for tonight

## 2023-09-10 NOTE — Assessment & Plan Note (Addendum)
Transfuse 3 units appreciate [facility] GI consult most likely upper GI bleed Follow serial CBC

## 2023-09-10 NOTE — Assessment & Plan Note (Signed)
Appears slightly on the dry side with AKI gently rehydrate and follow

## 2023-09-10 NOTE — Assessment & Plan Note (Signed)
Now patient seems to have more of right lower extremity edema obtain Dopplers

## 2023-09-10 NOTE — Assessment & Plan Note (Signed)
Allow permissive hypertension for tonight 

## 2023-09-10 NOTE — Assessment & Plan Note (Signed)
-   Order Sensitive  SSI   - continue home insulin but decreased to  5 units,  -  check TSH and HgA1C  - Hold by mouth medications   

## 2023-09-10 NOTE — Assessment & Plan Note (Signed)
Followed at [facility] now status post colectomy No blood in rectum source is likely upper Patient to follow-up with her regular oncologist.  No longer receiving chemoradiation therapy secondary to decompensation

## 2023-09-10 NOTE — Assessment & Plan Note (Signed)
-  will monitor on tele avoid QT prolonging medications, rehydrate correct electrolytes ? ?

## 2023-09-10 NOTE — ED Notes (Signed)
Patient has a urine culture in the main lab 

## 2023-09-11 ENCOUNTER — Inpatient Hospital Stay: Payer: Medicare HMO

## 2023-09-11 ENCOUNTER — Inpatient Hospital Stay (HOSPITAL_COMMUNITY): Payer: Medicare HMO | Admitting: Certified Registered"

## 2023-09-11 ENCOUNTER — Inpatient Hospital Stay (HOSPITAL_COMMUNITY): Payer: Medicare HMO

## 2023-09-11 ENCOUNTER — Encounter (HOSPITAL_COMMUNITY)
Admission: EM | Disposition: A | Discharge status: Home / Self Care | Payer: Self-pay | Source: Home / Self Care | Attending: Internal Medicine

## 2023-09-11 ENCOUNTER — Encounter (HOSPITAL_COMMUNITY): Payer: Self-pay | Admitting: Internal Medicine

## 2023-09-11 ENCOUNTER — Other Ambulatory Visit: Payer: Self-pay | Admitting: *Deleted

## 2023-09-11 ENCOUNTER — Inpatient Hospital Stay: Payer: Medicare HMO | Admitting: Oncology

## 2023-09-11 DIAGNOSIS — K922 Gastrointestinal hemorrhage, unspecified: Secondary | ICD-10-CM | POA: Diagnosis not present

## 2023-09-11 DIAGNOSIS — D649 Anemia, unspecified: Secondary | ICD-10-CM

## 2023-09-11 DIAGNOSIS — K222 Esophageal obstruction: Secondary | ICD-10-CM

## 2023-09-11 DIAGNOSIS — N179 Acute kidney failure, unspecified: Secondary | ICD-10-CM | POA: Diagnosis not present

## 2023-09-11 DIAGNOSIS — R609 Edema, unspecified: Secondary | ICD-10-CM | POA: Diagnosis not present

## 2023-09-11 DIAGNOSIS — K269 Duodenal ulcer, unspecified as acute or chronic, without hemorrhage or perforation: Secondary | ICD-10-CM

## 2023-09-11 DIAGNOSIS — E039 Hypothyroidism, unspecified: Secondary | ICD-10-CM

## 2023-09-11 DIAGNOSIS — C20 Malignant neoplasm of rectum: Secondary | ICD-10-CM

## 2023-09-11 DIAGNOSIS — G9340 Encephalopathy, unspecified: Secondary | ICD-10-CM | POA: Diagnosis not present

## 2023-09-11 HISTORY — PX: ESOPHAGOGASTRODUODENOSCOPY (EGD) WITH PROPOFOL: SHX5813

## 2023-09-11 HISTORY — PX: BIOPSY: SHX5522

## 2023-09-11 LAB — COMPREHENSIVE METABOLIC PANEL
ALT: 12 U/L (ref 0–44)
AST: 14 U/L — ABNORMAL LOW (ref 15–41)
Albumin: 2.9 g/dL — ABNORMAL LOW (ref 3.5–5.0)
Alkaline Phosphatase: 91 U/L (ref 38–126)
Anion gap: 7 (ref 5–15)
BUN: 31 mg/dL — ABNORMAL HIGH (ref 8–23)
CO2: 21 mmol/L — ABNORMAL LOW (ref 22–32)
Calcium: 7.9 mg/dL — ABNORMAL LOW (ref 8.9–10.3)
Chloride: 109 mmol/L (ref 98–111)
Creatinine, Ser: 0.98 mg/dL (ref 0.44–1.00)
GFR, Estimated: 59 mL/min — ABNORMAL LOW (ref >60)
Glucose, Bld: 112 mg/dL — ABNORMAL HIGH (ref 70–99)
Potassium: 3.9 mmol/L (ref 3.5–5.1)
Sodium: 137 mmol/L (ref 135–145)
Total Bilirubin: 1.1 mg/dL (ref 0.3–1.2)
Total Protein: 5.4 g/dL — ABNORMAL LOW (ref 6.5–8.1)

## 2023-09-11 LAB — CBC
HCT: 28.9 % — ABNORMAL LOW (ref 36.0–46.0)
HCT: 28.7 % — ABNORMAL LOW (ref 36.0–46.0)
HCT: 31 % — ABNORMAL LOW (ref 36.0–46.0)
Hemoglobin: 9.4 g/dL — ABNORMAL LOW (ref 12.0–15.0)
Hemoglobin: 9.2 g/dL — ABNORMAL LOW (ref 12.0–15.0)
Hemoglobin: 9.9 g/dL — ABNORMAL LOW (ref 12.0–15.0)
MCH: 28.6 pg (ref 26.0–34.0)
MCH: 28.5 pg (ref 26.0–34.0)
MCH: 28.8 pg (ref 26.0–34.0)
MCHC: 32.5 g/dL (ref 30.0–36.0)
MCHC: 32.1 g/dL (ref 30.0–36.0)
MCHC: 31.9 g/dL (ref 30.0–36.0)
MCV: 87.8 fL (ref 80.0–100.0)
MCV: 88.9 fL (ref 80.0–100.0)
MCV: 90.1 fL (ref 80.0–100.0)
Platelets: 157 10*3/uL (ref 150–400)
Platelets: 149 10*3/uL — ABNORMAL LOW (ref 150–400)
Platelets: 173 10*3/uL (ref 150–400)
RBC: 3.29 MIL/uL — ABNORMAL LOW (ref 3.87–5.11)
RBC: 3.23 MIL/uL — ABNORMAL LOW (ref 3.87–5.11)
RBC: 3.44 MIL/uL — ABNORMAL LOW (ref 3.87–5.11)
RDW: 15.3 % (ref 11.5–15.5)
RDW: 15.3 % (ref 11.5–15.5)
RDW: 15.9 % — ABNORMAL HIGH (ref 11.5–15.5)
WBC: 7.5 10*3/uL (ref 4.0–10.5)
WBC: 5.6 10*3/uL (ref 4.0–10.5)
WBC: 6.1 10*3/uL (ref 4.0–10.5)
nRBC: 0.3 % — ABNORMAL HIGH (ref 0.0–0.2)
nRBC: 0 % (ref 0.0–0.2)
nRBC: 0 % (ref 0.0–0.2)

## 2023-09-11 LAB — GLUCOSE, CAPILLARY
Glucose-Capillary: 107 mg/dL — ABNORMAL HIGH (ref 70–99)
Glucose-Capillary: 109 mg/dL — ABNORMAL HIGH (ref 70–99)
Glucose-Capillary: 110 mg/dL — ABNORMAL HIGH (ref 70–99)
Glucose-Capillary: 118 mg/dL — ABNORMAL HIGH (ref 70–99)
Glucose-Capillary: 125 mg/dL — ABNORMAL HIGH (ref 70–99)
Glucose-Capillary: 227 mg/dL — ABNORMAL HIGH (ref 70–99)
Glucose-Capillary: 59 mg/dL — ABNORMAL LOW (ref 70–99)
Glucose-Capillary: 70 mg/dL (ref 70–99)

## 2023-09-11 LAB — IRON AND TIBC
Iron: 37 ug/dL (ref 28–170)
Saturation Ratios: 9 % — ABNORMAL LOW (ref 10.4–31.8)
TIBC: 402 ug/dL (ref 250–450)
UIBC: 365 ug/dL

## 2023-09-11 LAB — PHOSPHORUS: Phosphorus: 3.5 mg/dL (ref 2.5–4.6)

## 2023-09-11 LAB — HEMOGLOBIN A1C
Hgb A1c MFr Bld: 5.7 % — ABNORMAL HIGH (ref 4.8–5.6)
Mean Plasma Glucose: 116.89 mg/dL

## 2023-09-11 LAB — TSH: TSH: 3.253 u[IU]/mL (ref 0.350–4.500)

## 2023-09-11 LAB — MRSA NEXT GEN BY PCR, NASAL: MRSA by PCR Next Gen: NOT DETECTED

## 2023-09-11 LAB — FERRITIN: Ferritin: 14 ng/mL (ref 11–307)

## 2023-09-11 LAB — MAGNESIUM: Magnesium: 2.1 mg/dL (ref 1.7–2.4)

## 2023-09-11 SURGERY — ESOPHAGOGASTRODUODENOSCOPY (EGD) WITH PROPOFOL
Anesthesia: Monitor Anesthesia Care

## 2023-09-11 MED ORDER — METHOCARBAMOL 1000 MG/10ML IJ SOLN
500.0000 mg | Freq: Three times a day (TID) | INTRAMUSCULAR | Status: DC | PRN
  Administered 2023-09-11: 500 mg via INTRAVENOUS
  Filled 2023-09-11 (×3): qty 5

## 2023-09-11 MED ORDER — SODIUM CHLORIDE 0.9% FLUSH: 10.0000 mL | INTRAVENOUS | Status: DC | PRN

## 2023-09-11 MED ORDER — DEXTROSE 50 % IV SOLN: 12.5000 g | INTRAVENOUS | Status: AC

## 2023-09-11 MED ORDER — ORAL CARE MOUTH RINSE: 15.0000 mL | OROMUCOSAL | Status: DC | PRN

## 2023-09-11 MED ORDER — PROPOFOL 10 MG/ML IV BOLUS
INTRAVENOUS | Status: DC | PRN
  Administered 2023-09-11: 20 mg via INTRAVENOUS
  Administered 2023-09-11: 40 mg via INTRAVENOUS

## 2023-09-11 MED ORDER — BACLOFEN 10 MG PO TABS
10.0000 mg | ORAL_TABLET | Freq: Four times a day (QID) | ORAL | Status: DC | PRN
  Administered 2023-09-11 (×2): 10 mg via ORAL
  Filled 2023-09-11 (×2): qty 1

## 2023-09-11 MED ORDER — SODIUM CHLORIDE 0.9% FLUSH
10.0000 mL | Freq: Two times a day (BID) | INTRAVENOUS | Status: DC
  Administered 2023-09-11 – 2023-09-13 (×4): 10 mL

## 2023-09-11 MED ORDER — PROPOFOL 500 MG/50ML IV EMUL
INTRAVENOUS | Status: DC | PRN
  Administered 2023-09-11: 50 ug/kg/min via INTRAVENOUS

## 2023-09-11 MED ORDER — DEXTROSE 50 % IV SOLN
INTRAVENOUS | Status: AC
  Administered 2023-09-11: 12.5 g via INTRAVENOUS
  Filled 2023-09-11: qty 50

## 2023-09-11 SURGICAL SUPPLY — 15 items

## 2023-09-11 NOTE — Plan of Care (Signed)
  Problem: Clinical Measurements: Goal: Ability to maintain clinical measurements within normal limits will improve Outcome: Progressing Goal: Will remain free from infection Outcome: Progressing Goal: Diagnostic test results will improve Outcome: Progressing Goal: Respiratory complications will improve Outcome: Progressing Goal: Cardiovascular complication will be avoided Outcome: Progressing   Problem: Nutrition: Goal: Adequate nutrition will be maintained Outcome: Progressing   Problem: Elimination: Goal: Will not experience complications related to bowel motility Outcome: Progressing Goal: Will not experience complications related to urinary retention Outcome: Progressing   Problem: Pain Management: Goal: General experience of comfort will improve Outcome: Progressing   Problem: Metabolic: Goal: Ability to maintain appropriate glucose levels will improve Outcome: Progressing   Problem: Nutritional: Goal: Maintenance of adequate nutrition will improve Outcome: Progressing Goal: Progress toward achieving an optimal weight will improve Outcome: Progressing   Problem: Skin Integrity: Goal: Risk for impaired skin integrity will decrease Outcome: Progressing   Problem: Tissue Perfusion: Goal: Adequacy of tissue perfusion will improve Outcome: Progressing

## 2023-09-11 NOTE — Assessment & Plan Note (Signed)
-   no s/s exac

## 2023-09-11 NOTE — Transfer of Care (Signed)
Immediate Anesthesia Transfer of Care Note  Patient: [name]  Procedure(s) Performed: ESOPHAGOGASTRODUODENOSCOPY (EGD) WITH PROPOFOL BIOPSY  Patient Location: Endoscopy Unit  Anesthesia Type:MAC  Level of Consciousness: drowsy and patient cooperative  Airway & Oxygen Therapy: Patient Spontanous Breathing and Patient connected to face mask oxygen  Post-op Assessment: Report given to RN and Post -op Vital signs reviewed and stable  Post vital signs: Reviewed and stable  Last Vitals:  Vitals Value Taken Time  BP 153/121 09/11/23 1518  Temp    Pulse 55 09/11/23 1520  Resp 16 09/11/23 1520  SpO2 100 % 09/11/23 1520  Vitals shown include unfiled device data.  Last Pain:  Vitals:   09/11/23 1300  TempSrc: Temporal  PainSc: 5          Complications: No notable events documented.

## 2023-09-11 NOTE — Anesthesia Postprocedure Evaluation (Signed)
Anesthesia Post Note  Patient: [name]  Procedure(s) Performed: ESOPHAGOGASTRODUODENOSCOPY (EGD) WITH PROPOFOL BIOPSY     Patient location during evaluation: Phase II Anesthesia Type: MAC Level of consciousness: awake and alert, patient cooperative and oriented Pain management: pain level controlled Vital Signs Assessment: post-procedure vital signs reviewed and stable Respiratory status: nonlabored ventilation, spontaneous breathing and respiratory function stable Cardiovascular status: blood pressure returned to baseline and stable Postop Assessment: no apparent nausea or vomiting Anesthetic complications: no   No notable events documented.  Last Vitals:  Vitals:   09/11/23 1530 09/11/23 1540  BP: (!) 116/40 (!) 146/34  Pulse: (!) 59 (!) 57  Resp: 15 10  Temp:    SpO2: 99% 98%    Last Pain:  Vitals:   09/11/23 1540  TempSrc:   PainSc: 5                  Linlee Cromie,E. Eydie Wormley

## 2023-09-11 NOTE — Progress Notes (Signed)
pt's hands are dusky. Cool to the touch. Slow cap refill. Dr. Frederick Peers notified. BP 140/63 MAP 91. Hgb 9.2. Per MD, try hot packs on hands.

## 2023-09-11 NOTE — Assessment & Plan Note (Signed)
-   Continue SSI and CBG monitoring ?

## 2023-09-11 NOTE — Assessment & Plan Note (Signed)
-   Chronic - Intermittent history of cellulitis - RLE duplex negative for DVT

## 2023-09-11 NOTE — Assessment & Plan Note (Addendum)
-   hx critical right carotid stenosis; underwent right CEA on 03/28/20 with Dr. Arbie Cookey  - has been on asa from vascular standpoint; see GIB

## 2023-09-11 NOTE — Assessment & Plan Note (Addendum)
-   on asa and plavix at home; currently on hold - see GIB - CVA 11/2019

## 2023-09-11 NOTE — Progress Notes (Signed)
Lab dra  Latest Reference Range & Units 09/11/23 03:55  WBC 4.0 - 10.5 K/uL 7.5  RBC 3.87 - 5.11 MIL/uL 3.29 (L)  Hemoglobin 12.0 - 15.0 g/dL 9.4 (L)  HCT 96.2 - 95.2 % 28.9 (L)  MCV 80.0 - 100.0 fL 87.8  MCH 26.0 - 34.0 pg 28.6  MCHC 30.0 - 36.0 g/dL 84.1  RDW 32.4 - 40.1 % 15.3  Platelets 150 - 400 K/uL 157  nRBC 0.0 - 0.2 % 0.3 (H)  (L): Data is abnormally low (H): Data is abnormally highwn @ 0530. 2hrs later transfusion

## 2023-09-11 NOTE — Assessment & Plan Note (Signed)
Continue Synthroid °

## 2023-09-11 NOTE — Assessment & Plan Note (Signed)
-   see GIB 

## 2023-09-11 NOTE — Op Note (Signed)
[facility] Patient Name: [name] Procedure Date: 09/11/2023 MRN: [id number] Attending MD: Lynann Bologna , MD, [id number] Date of Birth: 08-Jan-1946 CSN: [id number] Age: 77 Admit Type: Inpatient Procedure:                Upper GI endoscopy Indications:              Melena with Hb 3.8 s/p 3U to 9. Providers:                Lynann Bologna, MD, Suzy Bouchard, RN, Margaree Mackintosh, RN, Rozetta Nunnery, Technician Referring MD:              Medicines:                Monitored Anesthesia Care Complications:            No immediate complications. Estimated Blood Loss:     Estimated blood loss: none. Procedure:                Pre-Anesthesia Assessment:                           - Prior to the procedure, a History and Physical                            was performed, and patient medications and                            allergies were reviewed. The patient's tolerance of                            previous anesthesia was also reviewed. The risks                            and benefits of the procedure and the sedation                            options and risks were discussed with the patient.                            All questions were answered, and informed consent                            was obtained. Prior Anticoagulants: The patient has                            taken Plavix (clopidogrel), last dose was 3 days                            prior to procedure. ASA Grade Assessment: III - A                            patient with severe systemic disease. After  reviewing the risks and benefits, the patient was                            deemed in satisfactory condition to undergo the                            procedure.                           After obtaining informed consent, the endoscope was                            passed under direct vision. Throughout the                            procedure, the  patient's blood pressure, pulse, and                            oxygen saturations were monitored continuously. The                            [id number]-H190 ([id number] Olympus endoscope was introduced                            through the mouth, and advanced to the second part                            of duodenum. The upper GI endoscopy was                            accomplished without difficulty. The patient                            tolerated the procedure well. Scope In: Scope Out: Findings:      2 wide open distal esophageal rings were noted in lower third of the       esophagus, just above the GE junction 36 cm from the incisors..      A few localized 2 to 4 mm erosions with no bleeding and no stigmata of       recent bleeding were found in the gastric antrum. Few biopsies were       taken with a cold forceps for histology.      One 12 mm non-bleeding deep cratered duodenal ulcer with a clean ulcer       base (Forrest Class III) was found in the D1/D2 junction. It had       erythematous margins and would cause duodenal narrowing/stricture.       Another linear ulcer was noted just beyond this ulcer in the proximal       second portion of the duodenum.      Another (3rd) non-bleeding linear duodenal ulcer with a clean ulcer base       (Forrest Class III) was found in the second portion of the duodenum with       duodenal stricture. Impression:               - Duodenal ulcers with strictures.  Likely etiology                            of UGI bleeding. No active bleeding.                           - Erosive gastritis. Moderate Sedation:      Not Applicable - Patient had care per Anesthesia. Recommendation:           - Return patient to hospital ward for ongoing care.                           - Full liquid diet.                           - IV Protonix as a continuous drip x 72 hours, then                            twice daily.                           - Await pathology results.                            - No ibuprofen, naproxen, or other non-steroidal                            anti-inflammatory drugs.                           - Trend CBC.                           - Can resume Plavix 10/27. ?stop ASA- may get                            lodged in the stricture causing ulcers. Would                            prefer monotherapy if okay with hospitalist service                           - Would recommend repeating EGD in 12 weeks to                            ensure healing of ulcers and possible reevaluation                            of duodenal strictures/dilation if needed                           - The findings and recommendations were discussed                            with the patient. Procedure Code(s):        --- Professional ---  40981, Esophagogastroduodenoscopy, flexible,                            transoral; with biopsy, single or multiple Diagnosis Code(s):        --- Professional ---                           K22.2, Esophageal obstruction                           K31.89, Other diseases of stomach and duodenum                           K26.9, Duodenal ulcer, unspecified as acute or                            chronic, without hemorrhage or perforation                           K92.1, Melena (includes Hematochezia) CPT copyright 2022 [employer]. All rights reserved. The codes documented in this report are preliminary and upon coder review may  be revised to meet current compliance requirements. Lynann Bologna, MD 09/11/2023 3:27:19 PM This report has been signed electronically. Number of Addenda: 0

## 2023-09-11 NOTE — Assessment & Plan Note (Signed)
-   follows with Dr. Truett Perna -Off treatment for approximately 2 years with no clinical evidence of disease progression per oncology

## 2023-09-11 NOTE — Assessment & Plan Note (Signed)
-   baseline creatinine ~ 0.9 - patient presents with increase in creat >0.3 mg/dL above baseline or creat increase >1.5x baseline presumed to have occurred within past 7 days PTA - creat 1.2 on admission - s/p volume expansion with PRBC -Returned back to normal/baseline prior to discharge, 0.92

## 2023-09-11 NOTE — Plan of Care (Signed)
  Problem: Education: Goal: Knowledge of General Education information will improve Description Including pain rating scale, medication(s)/side effects and non-pharmacologic comfort measures Outcome: Progressing   

## 2023-09-11 NOTE — TOC Initial Note (Signed)
Transition of Care [facility]) - Initial/Assessment Note    Patient Details  Name: [name] MRN: [id number] Date of Birth: 04/21/1946  [facility] CM/SW Contact:    Howell Rucks, RN Phone Number: 09/11/2023, 11:39 AM  Clinical Narrative:  Met with pt at bedside to review role of TOC/NCM and review for dc planning. Pt reports she has an established PCP and pharmacy in place, no current home care services, reports home DME: walker, cane, potty chair, shower chair).   Pt reports she feels safe returning home with support from her spouse, daughter and grand daughters, pt reports she has transportation available at discharge. TOC will continue to follow.                Expected Discharge Plan: Home/Self Care Barriers to Discharge: Continued Medical Work up   Patient Goals and CMS Choice Patient states their goals for this hospitalization and ongoing recovery are:: return home with support from spouse          Expected Discharge Plan and Services                                              Prior Living Arrangements/Services   Lives with:: Spouse Patient language and need for interpreter reviewed:: Yes Do you feel safe going back to the place where you live?: Yes      Need for Family Participation in Patient Care: Yes (Comment) Care giver support system in place?: Yes (comment)   Criminal Activity/Legal Involvement Pertinent to Current Situation/Hospitalization: No - Comment as needed  Activities of Daily Living      Permission Sought/Granted                  Emotional Assessment Appearance:: Appears stated age Attitude/Demeanor/Rapport: Gracious Affect (typically observed): Accepting Orientation: : Oriented to Self, Oriented to Place, Oriented to  Time Alcohol / Substance Use: Not Applicable Psych Involvement: No (comment)  Admission diagnosis:  Weakness [R53.1] Upper GI bleed [K92.2] Patient Active Problem List   Diagnosis Date  Noted   Upper GI bleed 09/10/2023   Acute encephalopathy 09/10/2023   Prolonged QT interval 09/10/2023   Chronic diastolic CHF (congestive heart failure) (HCC) 09/10/2023   Parastomal hernia without obstruction or gangrene 04/27/2023   Colostomy complication (HCC) 04/27/2023   Ear stuffiness, right 11/24/2022   PAD (peripheral artery disease) (HCC) 11/30/2021   Obesity (BMI 30-39.9) 11/30/2021   Acute respiratory failure with hypoxia (HCC) 11/29/2021   Sepsis due to pneumonia (HCC) 11/29/2021   Severe protein-calorie malnutrition (HCC) 11/29/2021   Acute respiratory disease due to COVID-19 virus 11/29/2021   Duodenal ulcer    Acute gastric ulcer with hemorrhage    Abnormal CT of the abdomen    Malnutrition of moderate degree 10/09/2021   Sepsis (HCC) 10/08/2021   Symptomatic anemia 09/23/2021   Pressure injury of skin 08/26/2021   AKI (acute kidney injury) (HCC) 08/25/2021   Sepsis secondary to UTI (HCC) 08/25/2021   Goals of care, counseling/discussion 07/01/2021   Coronary artery disease 03/13/2021   Cellulitis of right lower limb 02/22/2021   Malignant neoplasm of overlapping sites of cervix (HCC) 02/18/2021   Rectal cancer (HCC) 01/29/2021   Rectal mass    Hydronephrosis    Iron deficiency anemia due to chronic blood loss    Non-intractable vomiting    Lower abdominal pain  UTI (urinary tract infection) 01/18/2021   Acute metabolic encephalopathy 09/30/2020   Peripheral nerve disease 09/07/2020   Vitamin D deficiency 09/07/2020   Carotid artery stenosis, symptomatic, right 03/28/2020   Preoperative clearance 03/21/2020   History of COVID-19 02/22/2020   Carotid artery disease (HCC) 02/22/2020   History of CVA (cerebrovascular accident) 02/11/2020   Macrocytic anemia 11/29/2019   Hypokalemia 07/27/2019   Hypomagnesemia 07/27/2019   History of cervical cancer 03/18/2019   Uncontrolled type 2 diabetes mellitus with hypoglycemia and hyperglycemia (HCC) 05/04/2018    Essential hypertension 05/04/2018   Bilateral lower extremity edema 07/31/2017   Hypothyroidism 06/06/2010   Hyperlipidemia associated with type 2 diabetes mellitus (HCC) 10/20/2008   PCP:  Lewis Moccasin, MD Pharmacy:   [facility] [address] [place] - [address] [place] [address] Phone: [phone] Fax: [phone]  [id number] - [place], [place] - [address] AT [facility] [address] [place] [address] Phone: [phone] Fax: [phone]     Social Determinants of Health (SDOH) Social History: SDOH Screenings   Transportation Needs: No Transportation Needs (05/04/2021)   Received from [facility]  Depression [phone]): Low Risk  (01/17/2020)  Tobacco Use: Medium Risk (09/10/2023)   SDOH Interventions:     Readmission Risk Interventions    09/11/2023   11:36 AM 12/02/2021    1:55 PM  Readmission Risk Prevention Plan  Transportation Screening Complete Complete  PCP or Specialist Appt within 3-5 Days Complete   HRI or Home Care Consult Complete   Social Work Consult for Recovery Care Planning/Counseling Complete   Palliative Care Screening Not Applicable   Medication Review Oceanographer) Complete Complete  PCP or Specialist appointment within 3-5 days of discharge  Complete  HRI or Home Care Consult  Complete  SW Recovery Care/Counseling Consult  Complete  Palliative Care Screening  Not Applicable  Skilled Nursing Facility  Patient Refused

## 2023-09-11 NOTE — Assessment & Plan Note (Signed)
-   Ferritin low.  Iron stores seemingly adequate; low sat ratio but normal iron level

## 2023-09-11 NOTE — Consult Note (Addendum)
WOC Nurse ostomy consult note Pt is familiar to WOC team from previous visit at the outpatient ostomy clinic in June of 2024.  She had colostomy surgery performed prior to that time at another facility.  She states her daughter performs pouch changes and emptying when she is not in the hospital.  Stoma type/location: Stoma is red and viable, 3/4 inch, flush with skin level, slight peristomal hernia surrounding the location.  Peristomal assessment: intact skin surrounding Output: 50cc liquid brown stool Ostomy pouching: Applied barrier ring and one piece flexible convex pouch, then pink tape around the pouch edges, as her routine prior to admission. These are the products she uses at home; 2 sets of each left at the bedside for staff nurse's use: use Supplies: barrier ring, Lawson # [id number], flexible convex pouch Lawson # [id number], and pink tape around pouch edges. Please re-consult if further assistance is needed.  Thank-you,  Cammie Mcgee MSN, RN, CWOCN, [place], CNS [phone]

## 2023-09-11 NOTE — Progress Notes (Signed)
Progress Note    LABRENDA RAQUEL   UEA:[id number]  DOB: February 12, 1946  DOA: 09/10/2023     1 PCP: Lewis Moccasin, MD  Initial CC: melena  Hospital Course: Ms. Garciagonzalez is a 77 yo female with PMH rectal/cervical adenocarcinoma now with colostomy, DMII, arthritis, CVA on Plavix, hypothyroidism, HLD who presented with severe weakness.  She was having dark stool from her ostomy bag and associated worsening lethargy/weakness. On workup on admission she was found to have hemoglobin 3.9 g/dL.  This was noted to be 7.4 g/dL approximately 1 week prior. She underwent transfusion of 3 units PRBC and hemoglobin improved up to 9.4 g/dL.  GI was consulted on admission as well.   Interval History:  Seen this morning prior to EGD; granddaughter present bedside as well. Patient weakness has improved s/p 3 units PRBC since admission.   Assessment and Plan: * Upper GI bleed - on asa/plavix in setting of CVA history and Right CEA for right carotid stenosis. Last dose plavix Wednesday - hx gastric ulcer 12/2021 - patient having dark stools from ostomy; FOBT positive - Hgb 3.9 g/dL on admission; transfused 3 units PRBC - Hgb improved to 9.4 g/dL this morning.  Continue trending - GI following.  Undergoing EGD today (erosive gastritis and 3 duodenal ulcers non bleeding found; latter considered culprit of GIB) - protonix drip x 72 hrs then BID per GI rec's - I discussed DAPT with both vascular and neurology; patient is okay to resume either drug (doesn't need both) once holding period concludes   Acute encephalopathy - Suspect multifactorial due to chronic opioids and severe anemia on admission - Mentation has improved this morning.  Granddaughter bedside  AKI (acute kidney injury) (HCC) - baseline creatinine ~ 0.9 - patient presents with increase in creat >0.3 mg/dL above baseline or creat increase >1.5x baseline presumed to have occurred within past 7 days PTA - creat 1.2 on admission - s/p volume  expansion with PRBC  Carotid artery stenosis, symptomatic, right - hx critical right carotid stenosis; underwent right CEA on 03/28/20 with Dr. Arbie Cookey  - has been on asa from vascular standpoint; see GIB  History of CVA (cerebrovascular accident) - on asa and plavix at home; currently on hold - see GIB - CVA 11/2019  Rectal cancer (HCC) - follows with Dr. Truett Perna -Off treatment for approximately 2 years with no clinical evidence of disease progression per oncology  Chronic diastolic CHF (congestive heart failure) (HCC) - no s/s exac  Prolonged QT interval - QTc 664 -Trend electrolytes  Symptomatic anemia - see GIB  Iron deficiency anemia due to chronic blood loss - Ferritin low.  Iron stores seemingly adequate; low sat ratio but normal iron level  Essential hypertension - Meds on hold in setting of GIB  Uncontrolled type 2 diabetes mellitus with hypoglycemia and hyperglycemia (HCC) - Continue SSI and CBG monitoring  Bilateral lower extremity edema - Chronic - Intermittent history of cellulitis - RLE duplex negative for DVT  Hyperlipidemia associated with type 2 diabetes mellitus (HCC) - Statin on hold for now  Hypothyroidism - Continue Synthroid   Old records reviewed in assessment of this patient  Antimicrobials:   DVT prophylaxis:  SCDs Start: 09/10/23 2304   Code Status:   Code Status: Full Code  Mobility Assessment (Last 72 Hours)     Mobility Assessment   No documentation.           Barriers to discharge: none Disposition Plan:  Home Status is: Inpt  Objective: Blood pressure (!) 146/34, pulse (!) 57, temperature (!) 97.3 F (36.3 C), temperature source Temporal, resp. rate 10, height 5' (1.524 m), weight 65.8 kg, SpO2 98%.  Examination:  Physical Exam Constitutional:      Appearance: Normal appearance.  HENT:     Head: Normocephalic and atraumatic.     Mouth/Throat:     Mouth: Mucous membranes are moist.  Eyes:     Extraocular  Movements: Extraocular movements intact.  Cardiovascular:     Rate and Rhythm: Normal rate and regular rhythm.  Pulmonary:     Effort: Pulmonary effort is normal. No respiratory distress.     Breath sounds: Normal breath sounds. No wheezing.  Abdominal:     General: Bowel sounds are normal. There is no distension.     Palpations: Abdomen is soft.     Tenderness: There is no abdominal tenderness.     Comments: Ostomy bag in place with no output as fresh bag  Musculoskeletal:        General: Normal range of motion.     Cervical back: Normal range of motion and neck supple.  Skin:    General: Skin is warm and dry.  Neurological:     General: No focal deficit present.     Mental Status: She is alert.  Psychiatric:        Mood and Affect: Mood normal.      Consultants:  GI  Procedures:  10/25: EGD  Data Reviewed: Results for orders placed or performed during the hospital encounter of 09/10/23 (from the past 24 hour(s))  CBC with Differential     Status: Abnormal   Collection Time: 09/10/23  4:47 PM  Result Value Ref Range   WBC 8.4 4.0 - 10.5 K/uL   RBC 1.45 (L) 3.87 - 5.11 MIL/uL   Hemoglobin 3.9 (LL) 12.0 - 15.0 g/dL   HCT 47.4 (L) 25.9 - 56.3 %   MCV 93.8 80.0 - 100.0 fL   MCH 26.9 26.0 - 34.0 pg   MCHC 28.7 (L) 30.0 - 36.0 g/dL   RDW 87.5 (H) 64.3 - 32.9 %   Platelets 200 150 - 400 K/uL   nRBC 0.0 0.0 - 0.2 %   Neutrophils Relative % 78 %   Neutro Abs 6.6 1.7 - 7.7 K/uL   Lymphocytes Relative 14 %   Lymphs Abs 1.1 0.7 - 4.0 K/uL   Monocytes Relative 7 %   Monocytes Absolute 0.6 0.1 - 1.0 K/uL   Eosinophils Relative 0 %   Eosinophils Absolute 0.0 0.0 - 0.5 K/uL   Basophils Relative 0 %   Basophils Absolute 0.0 0.0 - 0.1 K/uL   Immature Granulocytes 1 %   Abs Immature Granulocytes 0.04 0.00 - 0.07 K/uL  Comprehensive metabolic panel     Status: Abnormal   Collection Time: 09/10/23  4:47 PM  Result Value Ref Range   Sodium 135 135 - 145 mmol/L   Potassium 4.4  3.5 - 5.1 mmol/L   Chloride 105 98 - 111 mmol/L   CO2 21 (L) 22 - 32 mmol/L   Glucose, Bld 156 (H) 70 - 99 mg/dL   BUN 43 (H) 8 - 23 mg/dL   Creatinine, Ser 5.18 (H) 0.44 - 1.00 mg/dL   Calcium 8.1 (L) 8.9 - 10.3 mg/dL   Total Protein 5.5 (L) 6.5 - 8.1 g/dL   Albumin 3.0 (L) 3.5 - 5.0 g/dL   AST 15 15 - 41 U/L   ALT 10 0 - 44  U/L   Alkaline Phosphatase 98 38 - 126 U/L   Total Bilirubin 0.5 0.3 - 1.2 mg/dL   GFR, Estimated 47 (L) >60 mL/min   Anion gap 9 5 - 15  Troponin I (High Sensitivity)     Status: None   Collection Time: 09/10/23  4:47 PM  Result Value Ref Range   Troponin I (High Sensitivity) 12 <18 ng/L  Hemoglobin and hematocrit, blood     Status: Abnormal   Collection Time: 09/10/23  6:14 PM  Result Value Ref Range   Hemoglobin 3.8 (LL) 12.0 - 15.0 g/dL   HCT 08.6 (L) 57.8 - 46.9 %  Type and screen [facility]     Status: None (Preliminary result)   Collection Time: 09/10/23  6:14 PM  Result Value Ref Range   ABO/RH(D) O POS    Antibody Screen NEG    Sample Expiration 09/13/2023,2359    Unit Number [id number]    Blood Component Type RED CELLS,LR    Unit division 00    Status of Unit ISSUED,FINAL    Transfusion Status OK TO TRANSFUSE    Crossmatch Result Compatible    Unit Number [id number]    Blood Component Type RED CELLS,LR    Unit division 00    Status of Unit ISSUED,FINAL    Transfusion Status OK TO TRANSFUSE    Crossmatch Result      Compatible Performed at [facility], [address], [place], [place] [address]    Unit Number [id number]    Blood Component Type RED CELLS,LR    Unit division 00    Status of Unit ISSUED    Transfusion Status OK TO TRANSFUSE    Crossmatch Result Compatible   Prepare RBC (crossmatch)     Status: None   Collection Time: 09/10/23  6:14 PM  Result Value Ref Range   Order Confirmation      ORDER PROCESSED BY BLOOD BANK Performed at [facility], [address], [place], [place] [address]   Hemoglobin A1c     Status: Abnormal   Collection Time: 09/10/23  6:14 PM  Result Value Ref Range   Hgb A1c MFr Bld 5.7 (H) 4.8 - 5.6 %   Mean Plasma Glucose 116.89 mg/dL  Urinalysis, Routine w reflex microscopic -Urine, Clean Catch     Status: Abnormal   Collection Time: 09/10/23  7:44 PM  Result Value Ref Range   Color, Urine COLORLESS (A) YELLOW   APPearance CLEAR CLEAR   Specific Gravity, Urine 1.011 1.005 - 1.030   pH 6.0 5.0 - 8.0   Glucose, UA NEGATIVE NEGATIVE mg/dL   Hgb urine dipstick NEGATIVE NEGATIVE   Bilirubin Urine NEGATIVE NEGATIVE   Ketones, ur NEGATIVE NEGATIVE mg/dL   Protein, ur NEGATIVE NEGATIVE mg/dL   Nitrite NEGATIVE NEGATIVE   Leukocytes,Ua NEGATIVE NEGATIVE  POC occult blood, ED     Status: Abnormal   Collection Time: 09/10/23  8:23 PM  Result Value Ref Range   Fecal Occult Bld POSITIVE (A) NEGATIVE  Prepare RBC (crossmatch)     Status: None   Collection Time: 09/10/23  9:15 PM  Result Value Ref Range   Order Confirmation      ORDER PROCESSED BY BLOOD BANK Performed at [facility], [address], [place], [place] [address]   MRSA Next Gen by PCR, Nasal     Status: None   Collection Time: 09/10/23 11:08 PM   Specimen: Nasal Mucosa; Nasal  Swab  Result Value Ref Range   MRSA by PCR Next Gen NOT DETECTED NOT DETECTED  Glucose, capillary     Status: Abnormal   Collection Time: 09/11/23 12:01 AM  Result Value Ref Range   Glucose-Capillary 125 (H) 70 - 99 mg/dL  Magnesium     Status: None   Collection Time: 09/11/23  3:55 AM  Result Value Ref Range   Magnesium 2.1 1.7 - 2.4 mg/dL  Phosphorus     Status: None   Collection Time: 09/11/23  3:55 AM  Result Value Ref Range   Phosphorus 3.5 2.5 - 4.6 mg/dL  Comprehensive metabolic panel     Status: Abnormal   Collection Time: 09/11/23  3:55 AM  Result Value Ref Range   Sodium 137 135 - 145 mmol/L   Potassium 3.9 3.5 - 5.1 mmol/L   Chloride 109  98 - 111 mmol/L   CO2 21 (L) 22 - 32 mmol/L   Glucose, Bld 112 (H) 70 - 99 mg/dL   BUN 31 (H) 8 - 23 mg/dL   Creatinine, Ser 6.21 0.44 - 1.00 mg/dL   Calcium 7.9 (L) 8.9 - 10.3 mg/dL   Total Protein 5.4 (L) 6.5 - 8.1 g/dL   Albumin 2.9 (L) 3.5 - 5.0 g/dL   AST 14 (L) 15 - 41 U/L   ALT 12 0 - 44 U/L   Alkaline Phosphatase 91 38 - 126 U/L   Total Bilirubin 1.1 0.3 - 1.2 mg/dL   GFR, Estimated 59 (L) >60 mL/min   Anion gap 7 5 - 15  CBC     Status: Abnormal   Collection Time: 09/11/23  3:55 AM  Result Value Ref Range   WBC 7.5 4.0 - 10.5 K/uL   RBC 3.29 (L) 3.87 - 5.11 MIL/uL   Hemoglobin 9.4 (L) 12.0 - 15.0 g/dL   HCT 30.8 (L) 65.7 - 84.6 %   MCV 87.8 80.0 - 100.0 fL   MCH 28.6 26.0 - 34.0 pg   MCHC 32.5 30.0 - 36.0 g/dL   RDW 96.2 95.2 - 84.1 %   Platelets 157 150 - 400 K/uL   nRBC 0.3 (H) 0.0 - 0.2 %  Glucose, capillary     Status: Abnormal   Collection Time: 09/11/23  4:04 AM  Result Value Ref Range   Glucose-Capillary 109 (H) 70 - 99 mg/dL   Comment 1 Notify RN   Glucose, capillary     Status: None   Collection Time: 09/11/23  7:56 AM  Result Value Ref Range   Glucose-Capillary 70 70 - 99 mg/dL   Comment 1 Notify RN    Comment 2 Document in Chart   Glucose, capillary     Status: Abnormal   Collection Time: 09/11/23  9:39 AM  Result Value Ref Range   Glucose-Capillary 59 (L) 70 - 99 mg/dL  Glucose, capillary     Status: Abnormal   Collection Time: 09/11/23 10:38 AM  Result Value Ref Range   Glucose-Capillary 118 (H) 70 - 99 mg/dL  CBC     Status: Abnormal   Collection Time: 09/11/23 10:43 AM  Result Value Ref Range   WBC 5.6 4.0 - 10.5 K/uL   RBC 3.23 (L) 3.87 - 5.11 MIL/uL   Hemoglobin 9.2 (L) 12.0 - 15.0 g/dL   HCT 32.4 (L) 40.1 - 02.7 %   MCV 88.9 80.0 - 100.0 fL   MCH 28.5 26.0 - 34.0 pg   MCHC 32.1 30.0 - 36.0 g/dL   RDW  15.3 11.5 - 15.5 %   Platelets 149 (L) 150 - 400 K/uL   nRBC 0.0 0.0 - 0.2 %  TSH     Status: None   Collection Time: 09/11/23 10:43  AM  Result Value Ref Range   TSH 3.253 0.350 - 4.500 uIU/mL  Iron and TIBC     Status: Abnormal   Collection Time: 09/11/23 10:43 AM  Result Value Ref Range   Iron 37 28 - 170 ug/dL   TIBC 161 096 - 045 ug/dL   Saturation Ratios 9 (L) 10.4 - 31.8 %   UIBC 365 ug/dL  Ferritin     Status: None   Collection Time: 09/11/23 10:43 AM  Result Value Ref Range   Ferritin 14 11 - 307 ng/mL  Glucose, capillary     Status: Abnormal   Collection Time: 09/11/23 11:16 AM  Result Value Ref Range   Glucose-Capillary 110 (H) 70 - 99 mg/dL   Comment 1 Notify RN    Comment 2 Document in Chart   Glucose, capillary     Status: Abnormal   Collection Time: 09/11/23  4:24 PM  Result Value Ref Range   Glucose-Capillary 107 (H) 70 - 99 mg/dL    I have reviewed pertinent nursing notes, vitals, labs, and images as necessary. I have ordered labwork to follow up on as indicated.  I have reviewed the last notes from staff over past 24 hours. I have discussed patient's care plan and test results with nursing staff, CM/SW, and other staff as appropriate.  Time spent: Greater than 50% of the 55 minute visit was spent in counseling/coordination of care for the patient as laid out in the A&P.   LOS: 1 day   Lewie Chamber, MD [facility] 09/11/2023, 4:38 PM

## 2023-09-11 NOTE — Progress Notes (Signed)
Initial Nutrition Assessment  INTERVENTION:   Once diet advanced: -Boost Plus chocolate- Each supplement provides 360kcal and 14g protein.    -Multivitamin with minerals daily  NUTRITION DIAGNOSIS:   Increased nutrient needs related to acute illness as evidenced by estimated needs.  GOAL:   Patient will meet greater than or equal to 90% of their needs  MONITOR:   Diet advancement, Labs, Weight trends, I & O's  REASON FOR ASSESSMENT:   Consult Assessment of nutrition requirement/status  ASSESSMENT:   77 y.o. female with past medical history significant for rectal (01/2021) and recurrent cervical adenocarcinoma (03/2021), hypertension, CVA (on plavix), hypertension, diabetes, hypothyroidism, chronic wounds, neuropathy, and chronic anemia. Presents to the ER with  altered mental status, vomiting, dark tarry stool output, and fatigue. GI consulted for possible GI bleed.  Patient unavailable, in endoscopy for EGD. Per chart review, pt was having AMS and vomiting PTA. Currently with GI bleed. Has chronic leg swelling d/t lymphedema. H/o rectal cancer and no longer on treatment. Once diet advanced, recommend protein supplementation. Pt has drank Boost in the past.  Per weight records, pt has lost 9 lbs since 9/6 (5% wt loss x almost 2 months, insignificant for time frame).  Medications reviewed.  Labs reviewed: CBGs: 59-118   NUTRITION - FOCUSED PHYSICAL EXAM:  Unable to complete  Diet Order:   Diet Order             Diet NPO time specified  Diet effective now                   EDUCATION NEEDS:   Not appropriate for education at this time  Skin:  Skin Assessment: Reviewed RN Assessment  Last BM:  10/25 -colostomy  Height:   Ht Readings from Last 1 Encounters:  09/11/23 5' (1.524 m)    Weight:   Wt Readings from Last 1 Encounters:  09/11/23 65.8 kg    BMI:  Body mass index is 28.32 kg/m.  Estimated Nutritional Needs:   Kcal:   1400-1600  Protein:  60-75g  Fluid:  1.6L/day  Tilda Franco, MS, RD, LDN Inpatient Clinical Dietitian Contact information available via Amion

## 2023-09-11 NOTE — Consult Note (Addendum)
Consultation  Primary Care Physician:  Lewis Moccasin, MD Primary Gastroenterologist:  Dr. Orvan Falconer       Reason for Consultation: Upper GI bleed DOA: 09/10/2023         Hospital Day: 2         HPI:   [name] is a 77 y.o. female with past medical history significant for rectal (01/2021) and recurrent cervical adenocarcinoma (03/2021), hypertension, CVA (on plavix), hypertension, diabetes, hypothyroidism, chronic wounds, neuropathy, and chronic anemia.  03/18/2021-total abdominal hysterectomy, cystoscopy with insertion of ureteral stents, exploratory laparotomy, aborted low anterior resection, descending loop colostomy  Presents to the ER with  altered mental status, vomiting, dark tarry stool output, and fatigue. GI consulted for possible GI bleed.   Work up in ED notable for : BUN 43/creatinine 1.20.  Calcium 8.1.  Albumin 3.0.  Total protein 5.5.  Normal LFTs.  Hemoglobin 3.9.  WBC 8.4 platelets 188.  Fecal occult blood positive. No blood in vault per exam.  Patient a bit more sedated felt to be secondary to multiple pain medications.  3 units of RBCs transfused.  Patient started on Pantoprazole 80 mg IV every 12 hours.  Imaging:  10/24 CT HEAD- No acute intracranial abnormality.  10/24 Chest xray - No acute cardiopulmonary abnormality.   Pt was seen in ED on 10/19 for AMS, anemia, and possible UTI. At that time her Hgb was 7.4. They collected urine culture and discharged pt home to fup with PCP.   Patient was last seen by Dr. Orvan Falconer on 01/08/2022 for EGD in the [facility] for hx of gastric ulcer with persistent symptoms. 01/08/22 EGD revealed normal esophagus resolution of gastric ulcers and duodenal ulcer. Erythematous mucosa in the gastric body and atrium.  Biopsied.  Mucosal changes in the duodenum.  Biopsied  PT A&Ox4. Pt lying in bed, granddaughter at bedside.  Patient reports she has had dark tarry stool for the past 2 weeks from her ostomy.  She reports the output has  been more loose and required her to empty contents more frequently 3-4 times/day.  And is on Plavix and baby aspirin.  Her daughter oversees her medicine.  The granddaughter states her last dose of Plavix was Wednesday evening.  She reports the baby aspirin has been held for the past week after recent hospitalization.  Patient reports she takes over-the-counter Tylenol as needed.  She reports 1 episode of mild nausea no vomiting. No hematemesis. History of GERD with gastric ulcers and taking Pantoprazole 40 mg BID. She reports intermittent abdominal discomfort associated with her umbilical hernia, otherwise currently not having any acute pain. Hx of IDA, reports she took oral iron supplementation at one time, but I do not see if on her current med list. Reports she has had intermittent vaginal bleeding. No smoking or alcohol use.  Patient reports she has not received any chemotherapy in over 1 year due to unpleasant side effects.   Previous GI workup:   01/08/2022 with Dr. Orvan Falconer for gastric ulcer and persistent symptoms.  normal esophagus resolution of gastric ulcers and duodenal ulcer. Erythematous mucosa in the gastric body and atrium.  Biopsied.  Mucosal changes in the duodenum.  Biopsied  Diagnosis 1. Surgical [P], duodenal bulb, 2nd portion of duodenum, and distal duodenum MILD ACUTE DUODENITIS WITH GASTRIC METAPLASIA CONSISTENT WITH PEPTIC DUODENITIS 2. Surgical [P], fundus, gastric antrum, and gastric body REACTIVE GASTROPATHY WITH FOVEOLAR HYPERPLASIA AND FOCAL SURFACE EROSION MILD CHRONIC GASTRITIS NEGATIVE FOR H. PYLORI,  INTESTINAL METAPLASIA, DYSPLASIA AND CARCINOMA  10/12/21 EGD with Dr. Orvan Falconer for Nausea, vomiting, progressive anemia and abnormal duodenal wall thickening seen on CT in the setting of locally advanced inoperable rectal adenocarcinoma with LN mets  Impression: - Symptomatic gastric ulcer and duodenal ulcer disease. Biopsies pending. - Possible esophageal plaques were  found, suspicious for candidiasis. Biopsied. - LA Grade A reflux esophagitis with no bleeding. - Mucosal changes in the duodenum of unclear clinical significance. Biopsied.  FINAL MICROSCOPIC DIAGNOSIS:   A. DUODENAL BULB, BIOPSY:  -  Peptic duodenitis  -  No dysplasia or malignancy identified   B. GASTRIC, BIOPSY:  -  Chronic active gastritis  -  No intestinal metaplasia identified  -  See comment   C. ESOPHAGEAL, BIOPSY:  -  Acute fungal esophagitis  -  See comment   01/23/21 colonoscopy with Dr Lavon Paganini for Evaluation of unexplained GI bleeding presenting with Hematochezia, Evaluation of unexplained GI bleeding presenting with fecal occult blood, Lower abdominal pain, Unexplained iron deficiency anemia  Impression: - Stricture in the recto- sigmoid colon. Biopsied.  - Rule out malignancy, partially obstructing tumor in the recto- sigmoid colon. Biopsied. Tattooed.  FINAL MICROSCOPIC DIAGNOSIS:   A. RECTUM, BIOPSY:  -  Adenocarcinoma arising in a background of high-grade dysplasia  -  See comment    Past Medical History:  Diagnosis Date   Arthritis    Basal ganglia stroke [facility])    March 2022   Bilateral lower extremity edema    Cancer (HCC)    twice- colon cancer, unsure of the other type of cancer   Carotid artery occlusion    Flank pain    Full dentures    History of cervical cancer    11/ 1994  Stage IB  s/p  high dose radiation brachytherapy @ [facility] 01/ 1995--- per pt no recurrence   History of chronic bronchitis    History of hyperthyroidism    d 03/ 2011---due to grave's disease--- s/p  RAI treatment 04/ 2011   History of kidney stones    History of sepsis 05/03/2018   due to UTI with klebsiella/ pyelonephritis/ ureteral obstruction cause by stone   Hyperlipidemia    Hypothyroidism, postradioiodine therapy    endocrinologist--  dr Everardo All--  dx graves disease and s/p RAI i131 treatement 4/ 2011   Nauseated    Oral thrush 05/03/2018   Pneumonia    Type 2  diabetes mellitus treated with insulin (HCC)    FOLLOWED BY PCP   Urgency of urination    Wears glasses     Surgical History:  She  has a past surgical history that includes Appendectomy; Cystoscopy with stent placement (Right, 05/04/2018); Total hip arthroplasty (Left, 1990s); EXCISION MASS LEFT CHEST WALL (09-20-2007   dr Janee Morn  [facility]); transthoracic echocardiogram (08/07/2017); Tandem ring insertion (1995   dr Loree Fee @ [facility]); Revision total hip arthroplasty (Left, early 2000s); Cataract extraction w/ intraocular lens  implant, bilateral (2004  approx.); Cystoscopy/ureteroscopy/holmium laser/stent placement (Right, 05/19/2018); Radioactive Iodine Therapy; Carotid endarterectomy (Right, 03/28/2020); Endarterectomy (Right, 03/28/2020); Colonoscopy with propofol (N/A, 01/23/2021); biopsy (01/23/2021); Submucosal tattoo injection (01/23/2021); Esophagogastroduodenoscopy (egd) with propofol (N/A, 10/12/2021); biopsy (10/12/2021); Colonoscopy; and Upper gastrointestinal endoscopy. Family History:  Her family history includes Cancer in her mother; Diabetes in her father and mother; Emphysema in her brother, father, and sister. Social History:   reports that she quit smoking about 41 years ago. Her smoking use included cigarettes. She started smoking about 46 years ago. She has been exposed  to tobacco smoke. She has never used smokeless tobacco. She reports that she does not drink alcohol and does not use drugs.  Prior to Admission medications   Medication Sig Start Date End Date Taking? Authorizing Provider  aspirin 81 MG EC tablet Take 1 tablet (81 mg total) by mouth daily. RESUME AFTER 1 WEEK Patient taking differently: Take 81 mg by mouth daily. 09/03/21  Yes Osvaldo Shipper, MD  atorvastatin (LIPITOR) 40 MG tablet Take 40 mg by mouth every morning. 12/15/19  Yes [provider]  clopidogrel (PLAVIX) 75 MG tablet Take 1 tablet (75 mg total) by mouth at bedtime. 10/12/21  Yes Willeen Niece, MD  docusate sodium (COLACE) 100 MG capsule Take 100 mg by mouth 2 (two) times daily.   Yes [provider]  feeding supplement, GLUCERNA SHAKE, (GLUCERNA SHAKE) LIQD Take 237 mLs by mouth 3 (three) times daily between meals. 12/02/21  Yes Rolly Salter, MD  furosemide (LASIX) 20 MG tablet Take 20 mg by mouth daily. 11/19/21  Yes [provider]  gabapentin (NEURONTIN) 300 MG capsule Take 300-600 mg by mouth See admin instructions. Take one capsule (300 mg) by mouth every morning and two capsules (600 mg) at night   Yes [provider]  gemfibrozil (LOPID) 600 MG tablet Take 600 mg by mouth 2 (two) times daily.   Yes [provider]  insulin degludec (TRESIBA FLEXTOUCH) 100 UNIT/ML FlexTouch Pen Inject 10 Units into the skin daily. 08/28/21  Yes Osvaldo Shipper, MD  MAGnesium-Oxide 400 (240 Mg) MG tablet Take 1 tablet by mouth twice daily 08/10/23  Yes Ladene Artist, MD  melatonin 5 MG TABS Take 1 tablet (5 mg total) by mouth at bedtime. 12/02/21  Yes Rolly Salter, MD  metFORMIN (GLUCOPHAGE) 500 MG tablet Take 500 mg by mouth 2 (two) times daily. 02/01/21  Yes [provider]  Multiple Vitamin (MULTIVITAMIN WITH MINERALS) TABS tablet Take 1 tablet by mouth daily. 12/03/21  Yes Rolly Salter, MD  nystatin (MYCOSTATIN/NYSTOP) powder Apply 1 Application topically 2 (two) times daily. To groin rash 07/24/23  Yes Ladene Artist, MD  ondansetron (ZOFRAN) 8 MG tablet Take 1 tablet (8 mg total) by mouth every 6 (six) hours as needed for nausea or vomiting. 10/31/21  Yes Esterwood, Amy S, PA-C  polyethylene glycol powder (GLYCOLAX/MIRALAX) 17 GM/SCOOP powder Take 17 g by mouth daily.   Yes [provider]  sertraline (ZOLOFT) 50 MG tablet Take 50 mg by mouth daily.   Yes [provider]  triamcinolone cream (KENALOG) 0.1 % Apply 1 application  topically 2 (two) times daily as needed (groin rash). Mix with nystatin cream 09/19/20  Yes  [provider]  ascorbic acid (VITAMIN C) 500 MG tablet Take 1 tablet (500 mg total) by mouth daily. 12/03/21   Rolly Salter, MD  baclofen (LIORESAL) 10 MG tablet Take 10 mg by mouth 4 (four) times daily as needed for muscle spasms. 03/22/21   [provider]  cephALEXin (KEFLEX) 500 MG capsule Take 500 mg by mouth 4 (four) times daily. 07/13/23   [provider]  levothyroxine (SYNTHROID) 125 MCG tablet Take 1 tablet (125 mcg total) by mouth daily at 6 (six) AM. 08/29/21 07/24/23  Osvaldo Shipper, MD  ofloxacin (FLOXIN) 0.3 % OTIC solution 10 drops.    [provider]  pantoprazole (PROTONIX) 40 MG tablet Take 1 tablet by mouth twice daily 12/15/22   Tressia Danas, MD  prochlorperazine (COMPAZINE) 10 MG  tablet Take 10 mg by mouth every 6 (six) hours as needed for nausea or vomiting. Patient not taking: Reported on 02/02/2023    [provider]  sulfamethoxazole-trimethoprim (BACTRIM DS) 800-160 MG tablet Take 1 tablet by mouth 2 (two) times daily.    [provider]    Current Facility-Administered Medications  Medication Dose Route Frequency Provider Last Rate Last Admin   0.9 %  sodium chloride infusion (Manually program via Guardrails IV Fluids)   Intravenous Once Therisa Doyne, MD   Held at 09/10/23 2204   0.9 %  sodium chloride infusion   Intravenous Continuous Therisa Doyne, MD 50 mL/hr at 09/11/23 0430 Infusion Verify at 09/11/23 0430   acetaminophen (TYLENOL) tablet 650 mg  650 mg Oral Q6H PRN Therisa Doyne, MD       Or   acetaminophen (TYLENOL) suppository 650 mg  650 mg Rectal Q6H PRN Doutova, Anastassia, MD       Chlorhexidine Gluconate Cloth 2 % PADS 6 each  6 each Topical Daily Doutova, Anastassia, MD       fentaNYL (SUBLIMAZE) injection 12.5-50 mcg  12.5-50 mcg Intravenous Q2H PRN Therisa Doyne, MD   25 mcg at 09/11/23 0513   HYDROcodone-acetaminophen (NORCO/VICODIN) 5-325 MG per tablet 1-2 tablet  1-2  tablet Oral Q4H PRN Therisa Doyne, MD   1 tablet at 09/11/23 0158   insulin aspart (novoLOG) injection 0-9 Units  0-9 Units Subcutaneous Q4H Therisa Doyne, MD   1 Units at 09/11/23 0005   levothyroxine (SYNTHROID) tablet 137 mcg  137 mcg Oral Q0600 Therisa Doyne, MD   137 mcg at 09/11/23 0513   Oral care mouth rinse  15 mL Mouth Rinse PRN Doutova, Jonny Ruiz, MD       pantoprazole (PROTONIX) injection 80 mg  80 mg Intravenous Q12H Doutova, Anastassia, MD       sertraline (ZOLOFT) tablet 50 mg  50 mg Oral Daily Doutova, Anastassia, MD       sodium chloride flush (NS) 0.9 % injection 10 mL  10 mL Intracatheter PRN Doutova, Anastassia, MD       sodium chloride flush (NS) 0.9 % injection 10 mL  10 mL Intracatheter Q12H Doutova, Anastassia, MD        Allergies as of 09/10/2023 - Review Complete 09/10/2023  Allergen Reaction Noted   Codeine Nausea Only 10/19/2008    Review of Systems:    Constitutional: No weight loss, fever, chills, weakness or fatigue HEENT: Eyes: No change in vision               Ears, Nose, Throat:  No change in hearing or congestion Skin: No rash or itching Cardiovascular: No chest pain, chest pressure or palpitations   Respiratory: No SOB or cough Gastrointestinal: See HPI and otherwise negative Genitourinary: No dysuria or change in urinary frequency Neurological: No headache, dizziness or syncope Musculoskeletal: No new muscle or joint pain Hematologic: No bleeding or bruising Psychiatric: No history of depression or anxiety     Physical Exam:  Vital signs in last 24 hours: Temp:  [97.8 F (36.6 C)-98.6 F (37 C)] 97.8 F (36.6 C) (10/25 0345) Pulse Rate:  [53-77] 54 (10/25 0600) Resp:  [10-25] 18 (10/25 0600) BP: (123-196)/(28-105) 149/33 (10/25 0600) SpO2:  [86 %-100 %] 100 % (10/25 0600) Weight:  [65.7 kg-65.8 kg] 65.7 kg (10/24 2315) Last BM Date : 09/10/23 (colostomy) Last BM recorded by nurses in past 5 days No data  recorded  General:   Pleasant, well developed female  in no acute distress Head:  Normocephalic and atraumatic. Eyes: sclerae anicteric,conjunctive   Heart:  regular rate and rhythm, no murmurs or gallops Pulm: Clear anteriorly; no wheezing Abdomen:  Soft, Obese AB, Hypoactive bowel sounds. mild tenderness in the periumbilical area. Without guarding and Without rebound, No organomegaly appreciated. Ostomy bag to LLQ, C/D/I. Dark residue noted at site. Rectal: no blood in vault, no external hemorrhoids noted. Extremities:  With trace edema LLE, +2 RLE. Msk:  Symmetrical without gross deformities. Peripheral pulses intact.  Neurologic:  Alert and  oriented x4;  No focal deficits.  Skin:   Dry and intact without significant lesions or rashes. Psychiatric:  Cooperative. Normal mood and affect.  LAB RESULTS: Recent Labs    09/10/23 1647 09/10/23 1814 09/11/23 0355  WBC 8.4  --  7.5  HGB 3.9* 3.8* 9.4*  HCT 13.6* 13.5* 28.9*  PLT 200  --  157   BMET Recent Labs    09/10/23 1647 09/11/23 0355  NA 135 137  K 4.4 3.9  CL 105 109  CO2 21* 21*  GLUCOSE 156* 112*  BUN 43* 31*  CREATININE 1.20* 0.98  CALCIUM 8.1* 7.9*   LFT Recent Labs    09/11/23 0355  PROT 5.4*  ALBUMIN 2.9*  AST 14*  ALT 12  ALKPHOS 91  BILITOT 1.1   PT/INR No results for input(s): "LABPROT", "INR" in the last 72 hours.  STUDIES: CT HEAD WO CONTRAST ( )  Result Date: 09/10/2023 CLINICAL DATA:  Altered mental status. EXAM: CT HEAD WITHOUT CONTRAST TECHNIQUE: Contiguous axial images were obtained from the base of the skull through the vertex without intravenous contrast. RADIATION DOSE REDUCTION: This exam was performed according to the departmental dose-optimization program which includes automated exposure control, adjustment of the mA and/or kV according to patient size and/or use of iterative reconstruction technique. COMPARISON:  CT scan head from 05/21/2023. FINDINGS: Brain: No evidence of acute  infarction, hemorrhage, hydrocephalus, extra-axial collection or mass lesion/mass effect. Old lacunar infarct noted in the left corona radiata. There is bilateral periventricular hypodensity, which is non-specific but most likely seen in the settings of microvascular ischemic changes. Mild in extent. Otherwise normal appearance of brain parenchyma. Ventricles are normal. Cerebral volume is age appropriate. Vascular: No hyperdense vessel or unexpected calcification. Intracranial arteriosclerosis. Skull: Normal. Negative for fracture or focal lesion. Sinuses/Orbits: No acute finding. Other: Visualized mastoid air cells are unremarkable. No mastoid effusion. IMPRESSION: *No acute intracranial abnormality. Electronically Signed   By: Jules Schick M.D.   On: 09/10/2023 17:49   DG Chest Portable 1 View  Result Date: 09/10/2023 CLINICAL DATA:  Chest muscle spasm and pain. EXAM: PORTABLE CHEST 1 VIEW COMPARISON:  05/21/2023. FINDINGS: Bilateral lung fields are clear. Mild prominence of interstitial markings is similar to the prior study. No overt pulmonary edema. Bilateral costophrenic angles are clear. Stable cardio-mediastinal silhouette. No acute osseous abnormalities. The soft tissues are within normal limits. Right-sided CT Port-A-Cath is again seen with its tip overlying the lower portion of superior vena cava. IMPRESSION: *No acute cardiopulmonary abnormality. Electronically Signed   By: Jules Schick M.D.   On: 09/10/2023 17:46      Impression /Plan:   77 year old female patient that presents with altered mental status, dark tarry stool output from ostomy and fatigue.  Upon admission hemoglobin was 3.9 and patient received 3 units of RBCs.  GI consulted for possible upper GI bleed.  Acute on chronic anemia secondary to suspected lower GI bleed, pt reports melena for 2  weeks. On OAC (Plavix and ASA) for hx of CVA. Last dose of Plavix on 10/23 pm. Hx of IDA, unsure if on oral supplementation.  Patient  has history of gastric ulcers and reports taking PPI therapy BID.  01/08/22 EGD revealed normal esophagus resolution of gastric ulcers and duodenal ulcer. BUN 31/creatinine 0.98 Hemoglobin was 3.9 on 10/24, patient received 3 units of RBCs, today hemoglobin is 9.4, received fluids. Previously 7.4 on 09/05/23 and 9.6 on 07/24/2023. -NPO - Trend H/H -Protonix IV 80mg  every 12 hours (due to shortage) -recheck Iron panel,ferritin -transfuse as needed for Hgb <7 - scheduled for endoscopy this evening. I thoroughly discussed the procedure to include nature, alternatives, benefits, and risks including but not limited to bleeding, perforation, infection, anesthesia/cardiac and pulmonary complications. Patient provides understanding and gave verbal consent to proceed.  Rectal Ca (chemo at [facility]) s/p colectomy- no longer receiving chemotherapy per patient. Has ostomy.  Hypothyroidism- on home synthroid   type 2 diabetes mellitus  -SSI  History of CVA (cerebrovascular accident)  on home Plavix and ASA -Plavix held since Wed pm, ASA has been over for 1 week  Acute encephalopathy ? Pt A&Ox4 this morning. Hx of Chronic pain- on home Xtampza 9 mg twice daily, gabapentin 300 mg twice daily, baclofen 20 mg twice daily.   Principal Problem:   Upper GI bleed Active Problems:   Hypothyroidism   Hyperlipidemia associated with type 2 diabetes mellitus (HCC)   Bilateral lower extremity edema   Uncontrolled type 2 diabetes mellitus with hypoglycemia and hyperglycemia (HCC)   Essential hypertension   History of CVA (cerebrovascular accident)   Iron deficiency anemia due to chronic blood loss   Rectal cancer (HCC)   AKI (acute kidney injury) (HCC)   Symptomatic anemia   Acute encephalopathy   Prolonged QT interval   Chronic diastolic CHF (congestive heart failure) (HCC)    LOS: 1 day    Thank you for your kind consultation, we will continue to follow.   Deanna J May  09/11/2023, 8:31 AM      Attending physician's note   I have taken history, reviewed the chart and examined the patient. I performed a substantive portion of this encounter, including complete performance of at least one of the key components, in conjunction with the APP. I agree with the Advanced Practitioner's note, impression and recommendations.   Acute on chronic anemia. Hb 3.8 Suspected UGI bleed given melena.H/O Gastric ulcers CVA on aspirin/Plavix. Last dose of Plavix 1023 H/O  rectal CA s/p colectomy/ ostomy   Plan: - IV Protonix - transfuse to Hb 8-9 - EGD today. discussed risks and benefits.   Edman Circle, MD [facility] [phone]

## 2023-09-11 NOTE — Assessment & Plan Note (Signed)
-   Statin on hold for now

## 2023-09-11 NOTE — Assessment & Plan Note (Signed)
-   resume home regimen

## 2023-09-11 NOTE — Progress Notes (Signed)
IP PROGRESS NOTE  Subjective:   [name] is well-known to me with a history of locally advanced rectal/cervical adenocarcinoma.  She has been maintained off of treatment for almost 2 years and there has been no clinical evidence of disease progression.  She has chronic leg swelling and intermittent lower leg cellulitis. She presented to the emergency room yesterday with altered mental status.  She was found to have severe anemia and received 3 units of packed red blood cells.  She reports dark stool output from the ostomy for the past several weeks.  She complains of increased pain in the left leg with cramping in the left calf.  The pain has improved today.  She had a recent episode of emesis.  She has chronic intermittent small-volume rectal bleeding and reports recent vaginal bleeding.  No vaginal bleeding during the night per the bedside RN.  Objective: Vital signs in last 24 hours: Blood pressure (!) 147/41, pulse 60, temperature 97.8 F (36.6 C), temperature source Oral, resp. rate 11, height 5' (1.524 m), weight 144 lb 13.5 oz (65.7 kg), SpO2 100%.  Intake/Output from previous day: 10/24 0701 - 10/25 0700 In: 2112.6 [I.V.:82.2; Blood:1080.4; IV Piggyback:950] Out: 750 [Urine:750]  Physical Exam:  HEENT: No thrush Lungs: Clear bilaterally Cardiac: Regular rate and rhythm Abdomen: Nontender, no hepatosplenomegaly, left lower quadrant colostomy with dark stool Extremities: Right greater than left leg edema with mild lower leg erythema bilaterally Neurologic: Alert and oriented  Portacath/PICC-without erythema  Lab Results: Recent Labs    09/10/23 1647 09/10/23 1814 09/11/23 0355  WBC 8.4  --  7.5  HGB 3.9* 3.8* 9.4*  HCT 13.6* 13.5* 28.9*  PLT 200  --  157    BMET Recent Labs    09/10/23 1647 09/11/23 0355  NA 135 137  K 4.4 3.9  CL 105 109  CO2 21* 21*  GLUCOSE 156* 112*  BUN 43* 31*  CREATININE 1.20* 0.98  CALCIUM 8.1* 7.9*    Lab Results  Component  Value Date   CEA1 28.60 (H) 06/12/2021   CEA 44.01 (H) 02/02/2023    Studies/Results: CT HEAD WO CONTRAST ( )  Result Date: 09/10/2023 CLINICAL DATA:  Altered mental status. EXAM: CT HEAD WITHOUT CONTRAST TECHNIQUE: Contiguous axial images were obtained from the base of the skull through the vertex without intravenous contrast. RADIATION DOSE REDUCTION: This exam was performed according to the departmental dose-optimization program which includes automated exposure control, adjustment of the mA and/or kV according to patient size and/or use of iterative reconstruction technique. COMPARISON:  CT scan head from 05/21/2023. FINDINGS: Brain: No evidence of acute infarction, hemorrhage, hydrocephalus, extra-axial collection or mass lesion/mass effect. Old lacunar infarct noted in the left corona radiata. There is bilateral periventricular hypodensity, which is non-specific but most likely seen in the settings of microvascular ischemic changes. Mild in extent. Otherwise normal appearance of brain parenchyma. Ventricles are normal. Cerebral volume is age appropriate. Vascular: No hyperdense vessel or unexpected calcification. Intracranial arteriosclerosis. Skull: Normal. Negative for fracture or focal lesion. Sinuses/Orbits: No acute finding. Other: Visualized mastoid air cells are unremarkable. No mastoid effusion. IMPRESSION: *No acute intracranial abnormality. Electronically Signed   By: Jules Schick M.D.   On: 09/10/2023 17:49   DG Chest Portable 1 View  Result Date: 09/10/2023 CLINICAL DATA:  Chest muscle spasm and pain. EXAM: PORTABLE CHEST 1 VIEW COMPARISON:  05/21/2023. FINDINGS: Bilateral lung fields are clear. Mild prominence of interstitial markings is similar to the prior study. No overt pulmonary edema. Bilateral  costophrenic angles are clear. Stable cardio-mediastinal silhouette. No acute osseous abnormalities. The soft tissues are within normal limits. Right-sided CT Port-A-Cath is again  seen with its tip overlying the lower portion of superior vena cava. IMPRESSION: *No acute cardiopulmonary abnormality. Electronically Signed   By: Jules Schick M.D.   On: 09/10/2023 17:46    Medications: I have reviewed the patient's current medications.  Assessment/Plan: Rectal cancer Partially obstructing mass beginning at 10 cm from the anal verge on colonoscopy 01/23/2021, biopsy-adenocarcinoma, immunohistochemical stains at [facility]-CK20, CDX-2, and SATB2 positive, patchy strong staining for p16.  The rectal tumor has a distinct immunohistochemical profile suggesting a primary colorectal tumor as opposed to metastatic cervical cancer. CT abdomen/pelvis 01/18/2021-bilateral hydronephrosis, endometrial fluid collection, prominent stool in the colon MRI pelvis 01/23/2021-tumor at 7.6 cm from the anal verge, abnormal signal bridge in the cervix, mesorectum, and anterior aspect of the rectum-similar to MRI from 2020, MRI stage could not be defined secondary to posttreatment changes in the pelvis 03/18/2021-total abdominal hysterectomy, cystoscopy with insertion of ureteral stents, exploratory laparotomy, aborted low anterior resection, descending loop colostomy--right and left fallopian tubes and ovaries negative for malignancy; excision portion of cervix positive for adenocarcinoma; uterus with invasive moderately differentiated adenocarcinoma of the cervix, HPV associated, invading through full-thickness of the cervix/lower uterine segment and into parametrial tissue, margins of resection received disrupted, cannot be evaluated; remaining uterus with extensive endocervical adenocarcinoma in situ involving lower uterine segment and replacing endometrium.  This carcinoma was felt to be a separate primary from the rectal tumor. PET scan at [facility] 04/11/2021-intense FDG activity in region of known rectal mass.  Hypermetabolic left external iliac and right inguinal lymph nodes.  Minimal FDG uptake and small right  supraclavicular lymph node.  No uptake seen in the cervix. 04/23/2021 FNA right inguinal lymph node-negative for malignancy Evaluated by Dr. Lattie Corns 05/21/2021, 05/28/2021-appears to have 2 synchronous malignancies (cervical adenocarcinoma and rectal adenocarcinoma); further surgery which would entail pelvic exenteration not recommended by gynecologic oncology or colorectal surgery.  Full doses of radiotherapy not felt to be feasible.  Recommended 3 months of CAPOX and then repeat MRI of abdomen/pelvis, chest CT and colonoscopy. Cycle 1 Xeloda 06/13/2021 Cycle 1 FOLFOX 07/08/2021, oxaliplatin dose reduced secondary to pre-existing neuropathy Cycle 2 FOLFOX 07/29/2021, Udenyca Cycle 3 FOLFOX 08/12/2021, Udenyca Cycle 4 FOLFOX 09/02/2021, Udenyca Cycle 5 FOLFOX 09/17/2021, Udenyca CTs at [facility] 10/01/2021-unchanged findings of rectal malignancy status posttreatment.  Marked thickening of the proximal duodenal wall.  Unchanged bilateral inguinal lymph nodes which are not enlarged but avid on prior PET.  Unchanged diffuse bladder wall thickening and perivesicular stranding which may represent radiation cystitis.  New mild right lower lobe bronchial wall thickening, mucus impaction and tree-in-bud pulmonary nodules. Cycle 6 FOLFOX 10/22/2021, Udenyca CT 01/21/2022-stable soft tissue thickening at the lower rectum, slight increase in adjacent stranding no evidence of metastatic disease in the chest, abdomen, or pelvis, interval diverting loop colostomy CT pelvis and lower extremities 04/22/2023-subcutaneous fat stranding in the lower legs bilaterally on the right greater than left, enlarged right inguinal lymph nodes, total left hip arthroplasty with stable acetabular bone erosions, ill-defined soft tissue density in the region of the distal rectum with surrounding fat stranding unchanged     Bilateral hydronephrosis-secondary to urinary retention? Hematometra found on pelvic MRI 01/23/2021, evaluated by gynecologic  oncology Cervical cancer November 1994, stage Ib, treated with external beam radiation and brachytherapy at [facility] Diabetes Neuropathy Chronic pain secondary to #6 Recurrent urinary tract infections History of a CVA  History of hyperthyroidism G3, P3 COVID-19 infection January 2021 Admission 08/25/2021 with a urinary tract infection-culture negative Anemia secondary to chronic disease, chemotherapy, and rectal bleeding-1 unit packed red blood cells 08/26/2021 Hospital admission 09/23/2021-symptomatic anemia 16.  Admission 10/08/2021 with altered mental status, nausea/vomiting, and left foot ulcers Urine culture 10/08/2021 positive for Pseudomonas aeruginosa EGD 10/12/2021-gastric and duodenal ulcers-not bleeding 17.  Admission 11/29/2021 through 12/02/2021 with COVID-pneumonia 18.  Right lower extremity cellulitis 04/22/2023-Bactrim, repeat course of Bactrim 05/07/2023 19.  Admission 09/10/2023 with severe symptomatic anemia, dark stool-Hemoccult positive 3 units packed red blood cells  [name] has a history of locally advanced rectal/cervical adenocarcinoma.  She is currently maintained off of treatment for cancer and there has been no clinical evidence of disease progression.  She has chronic lower extremity edema, likely related to lymphedema from pelvic radiation.  She has a history of recurrent lower extremity cellulitis.  Multiple Doppler studies of the legs have been negative for DVT.  She has chronic mild anemia, likely secondary to anemia of chronic disease and low-volume rectal/vaginal blood loss.  She had severe anemia when she was seen in the emergency room last week and now  presents with severe symptomatic anemia, most likely secondary to upper GI bleeding.  The hemoglobin has improved following transfusion with packed red blood cells.  I have a low clinical suspicion for progressive carcinoma involving the GI tract, but this is possible.  [name] has indicated repeatedly she does  not risk to receive further chemotherapy.  Recommendations: 1.  GI consultation to consider endoscopic evaluation 2.  Hold aspirin and Plavix-decision on resuming per her primary provider and neurology 3.  Please call oncology as needed, outpatient follow-up will be scheduled at the Cancer center.     LOS: 1 day   Thornton Papas, MD   09/11/2023, 6:27 AM

## 2023-09-11 NOTE — Assessment & Plan Note (Addendum)
-   on asa/plavix in setting of CVA history and Right CEA for right carotid stenosis. Last dose plavix Wednesday - hx gastric ulcer 12/2021 - patient having dark stools from ostomy; FOBT positive - Hgb 3.9 g/dL on admission; transfused 3 units PRBC - Hgb improved to 9.4 g/dL this morning.  Continue trending - GI following.  Undergoing EGD today (erosive gastritis and 3 duodenal ulcers non bleeding found; latter considered culprit of GIB) - protonix drip x 72 hrs then BID per GI rec's - I discussed DAPT with both vascular and neurology; patient is okay to resume either drug (doesn't need both) once holding period concludes

## 2023-09-11 NOTE — Progress Notes (Signed)
The following labs were drawn at 0530, x2 hours after the last unit of blood given.  The time given of 0355 is incorrect and lab has been made aware.  Lab attempting to correct on their end, but will not flow to results tab at this time.  Hgb now 9.4 after x3 units of blood   Latest Reference Range & Units 09/11/23 03:55  COMPREHENSIVE METABOLIC PANEL  Rpt !  Sodium 135 - 145 mmol/L 137  Potassium 3.5 - 5.1 mmol/L 3.9  Chloride 98 - 111 mmol/L 109  CO2 22 - 32 mmol/L 21 (L)  Glucose 70 - 99 mg/dL 098 (H)  BUN 8 - 23 mg/dL 31 (H)  Creatinine 1.19 - 1.00 mg/dL 1.47  Calcium 8.9 - 82.9 mg/dL 7.9 (L)  Anion gap 5 - 15  7  Phosphorus 2.5 - 4.6 mg/dL 3.5  Magnesium 1.7 - 2.4 mg/dL 2.1  Alkaline Phosphatase 38 - 126 U/L 91  Albumin 3.5 - 5.0 g/dL 2.9 (L)  AST 15 - 41 U/L 14 (L)  ALT 0 - 44 U/L 12  Total Protein 6.5 - 8.1 g/dL 5.4 (L)  Total Bilirubin 0.3 - 1.2 mg/dL 1.1  GFR, Estimated >56 mL/min 59 (L)  WBC 4.0 - 10.5 K/uL 7.5  RBC 3.87 - 5.11 MIL/uL 3.29 (L)  Hemoglobin 12.0 - 15.0 g/dL 9.4 (L)  HCT 21.3 - 08.6 % 28.9 (L)  MCV 80.0 - 100.0 fL 87.8  MCH 26.0 - 34.0 pg 28.6  MCHC 30.0 - 36.0 g/dL 57.8  RDW 46.9 - 62.9 % 15.3  Platelets 150 - 400 K/uL 157  nRBC 0.0 - 0.2 % 0.3 (H)  !: Data is abnormal (L): Data is abnormally low (H): Data is abnormally high Rpt: View report in Results Review for more information

## 2023-09-11 NOTE — Hospital Course (Addendum)
[name] is a 77 yo female with PMH rectal/cervical adenocarcinoma now with colostomy, DMII, arthritis, CVA on Plavix, right carotid stenosis s/p endarterectomy 03/28/20, hypothyroidism, HLD who presented with severe weakness.  She was having dark stool from her ostomy bag and associated worsening lethargy/weakness. On workup on admission she was found to have hemoglobin 3.9 g/dL.  This was noted to be 7.4 g/dL approximately 1 week prior. She underwent transfusion of 3 units PRBC and hemoglobin improved up to 9.4 g/dL.  GI was consulted on admission as well. She underwent EGD on 09/11/23 and was found to have erosive gastritis and 3 duodenal ulcers.

## 2023-09-11 NOTE — Progress Notes (Signed)
Right lower extremity venous duplex has been completed. Preliminary results can be found in CV Proc through chart review.   09/11/23 12:14 PM Olen Cordial RVT

## 2023-09-11 NOTE — Assessment & Plan Note (Signed)
-   QTc 664 -Trend electrolytes

## 2023-09-11 NOTE — Anesthesia Preprocedure Evaluation (Addendum)
Anesthesia Evaluation  Patient identified by MRN, date of birth, ID band Patient awake    Reviewed: Allergy & Precautions, NPO status , Patient's Chart, lab work & pertinent test results  History of Anesthesia Complications Negative for: history of anesthetic complications  Airway Mallampati: I  TM Distance: >3 FB Neck ROM: Full    Dental  (+) Edentulous Upper, Edentulous Lower   Pulmonary former smoker   breath sounds clear to auscultation       Cardiovascular hypertension, Pt. on medications (-) angina + Peripheral Vascular Disease and +CHF   Rhythm:Regular Rate:Normal  '21 Myoview: EF 61%, no ischemia, low risk '21 ECHO: EF 60 to 65%. 1. The LV has normal function, no regional wall motion abnormalities. Grade I diastolic  dysfunction (impaired relaxation).   2. RVF is normal. The right ventricular size is normal.   3. The mitral valve is normal in structure. No evidence of MR. No evidence of mitral stenosis.   4. The aortic valve is tricuspid. Aortic valve regurgitation is not visualized. Mild aortic valve sclerosis is present, with no evidence of aortic valve stenosis.     Neuro/Psych CVA, No Residual Symptoms    GI/Hepatic Neg liver ROS,GERD  Medicated and Controlled,,H/o colon cancer   Endo/Other  diabetes, Oral Hypoglycemic AgentsHypothyroidism    Renal/GU Renal InsufficiencyRenal disease     Musculoskeletal   Abdominal   Peds  Hematology  (+) Blood dyscrasia (Hb 9.2, plt 149k)   Anesthesia Other Findings H/o cervical cancer  Reproductive/Obstetrics                             Anesthesia Physical Anesthesia Plan  ASA: 3  Anesthesia Plan: MAC   Post-op Pain Management: Minimal or no pain anticipated   Induction:   PONV Risk Score and Plan: 2 and Treatment may vary due to age or medical condition  Airway Management Planned: Natural Airway and Nasal Cannula  Additional  Equipment: None  Intra-op Plan:   Post-operative Plan:   Informed Consent: I have reviewed the patients History and Physical, chart, labs and discussed the procedure including the risks, benefits and alternatives for the proposed anesthesia with the patient or authorized representative who has indicated his/her understanding and acceptance.       Plan Discussed with: CRNA and Surgeon  Anesthesia Plan Comments:         Anesthesia Quick Evaluation

## 2023-09-11 NOTE — Assessment & Plan Note (Addendum)
-   Suspect multifactorial due to chronic opioids and severe anemia on admission - Mentation has improved - okay to resume oxy

## 2023-09-11 NOTE — Progress Notes (Signed)
Patient hospitalized w/GI bleed last night. Per Dr. Truett Perna, schedule for lab/flush/OV in 3-4 weeks with CBC, blood bank and CMP. Orders placed and scheduling message sent.

## 2023-09-12 DIAGNOSIS — K922 Gastrointestinal hemorrhage, unspecified: Secondary | ICD-10-CM | POA: Diagnosis not present

## 2023-09-12 DIAGNOSIS — F1120 Opioid dependence, uncomplicated: Secondary | ICD-10-CM | POA: Insufficient documentation

## 2023-09-12 DIAGNOSIS — G9340 Encephalopathy, unspecified: Secondary | ICD-10-CM | POA: Diagnosis not present

## 2023-09-12 LAB — CBC
HCT: 30.3 % — ABNORMAL LOW (ref 36.0–46.0)
HCT: 29.1 % — ABNORMAL LOW (ref 36.0–46.0)
Hemoglobin: 9.5 g/dL — ABNORMAL LOW (ref 12.0–15.0)
Hemoglobin: 9.2 g/dL — ABNORMAL LOW (ref 12.0–15.0)
MCH: 28.5 pg (ref 26.0–34.0)
MCH: 28.6 pg (ref 26.0–34.0)
MCHC: 31.4 g/dL (ref 30.0–36.0)
MCHC: 31.6 g/dL (ref 30.0–36.0)
MCV: 91 fL (ref 80.0–100.0)
MCV: 90.4 fL (ref 80.0–100.0)
Platelets: 166 10*3/uL (ref 150–400)
Platelets: 161 10*3/uL (ref 150–400)
RBC: 3.33 MIL/uL — ABNORMAL LOW (ref 3.87–5.11)
RBC: 3.22 MIL/uL — ABNORMAL LOW (ref 3.87–5.11)
RDW: 15.6 % — ABNORMAL HIGH (ref 11.5–15.5)
RDW: 15.7 % — ABNORMAL HIGH (ref 11.5–15.5)
WBC: 5.8 10*3/uL (ref 4.0–10.5)
WBC: 5.7 10*3/uL (ref 4.0–10.5)
nRBC: 0 % (ref 0.0–0.2)
nRBC: 0 % (ref 0.0–0.2)

## 2023-09-12 LAB — GLUCOSE, CAPILLARY
Glucose-Capillary: 101 mg/dL — ABNORMAL HIGH (ref 70–99)
Glucose-Capillary: 114 mg/dL — ABNORMAL HIGH (ref 70–99)
Glucose-Capillary: 127 mg/dL — ABNORMAL HIGH (ref 70–99)
Glucose-Capillary: 145 mg/dL — ABNORMAL HIGH (ref 70–99)
Glucose-Capillary: 266 mg/dL — ABNORMAL HIGH (ref 70–99)
Glucose-Capillary: 97 mg/dL (ref 70–99)

## 2023-09-12 LAB — BASIC METABOLIC PANEL
Anion gap: 7 (ref 5–15)
BUN: 21 mg/dL (ref 8–23)
CO2: 23 mmol/L (ref 22–32)
Calcium: 8 mg/dL — ABNORMAL LOW (ref 8.9–10.3)
Chloride: 110 mmol/L (ref 98–111)
Creatinine, Ser: 0.92 mg/dL (ref 0.44–1.00)
GFR, Estimated: >60 mL/min (ref >60)
Glucose, Bld: 116 mg/dL — ABNORMAL HIGH (ref 70–99)
Potassium: 3.6 mmol/L (ref 3.5–5.1)
Sodium: 140 mmol/L (ref 135–145)

## 2023-09-12 LAB — MAGNESIUM: Magnesium: 1.9 mg/dL (ref 1.7–2.4)

## 2023-09-12 MED ORDER — SODIUM CHLORIDE 0.9% FLUSH: 10.0000 mL | INTRAVENOUS | Status: DC | PRN

## 2023-09-12 MED ORDER — OXYCODONE HCL ER 10 MG PO T12A
10.0000 mg | EXTENDED_RELEASE_TABLET | Freq: Two times a day (BID) | ORAL | Status: DC
  Administered 2023-09-12 – 2023-09-13 (×3): 10 mg via ORAL
  Filled 2023-09-12 (×3): qty 1

## 2023-09-12 MED ORDER — HYDRALAZINE HCL 20 MG/ML IJ SOLN
5.0000 mg | Freq: Four times a day (QID) | INTRAMUSCULAR | Status: DC | PRN
  Administered 2023-09-12: 5 mg via INTRAVENOUS
  Filled 2023-09-12: qty 1

## 2023-09-12 NOTE — Progress Notes (Signed)
Progress Note    [name]   ZOX:[id number]  DOB: 03-25-1946  DOA: 09/10/2023     2 PCP: [name] Moccasin, MD  Initial CC: melena  Hospital Course: [name] is a 77 yo female with PMH rectal/cervical adenocarcinoma now with colostomy, DMII, arthritis, CVA on Plavix, right carotid stenosis s/p endarterectomy 03/28/20, hypothyroidism, HLD who presented with severe weakness.  She was having dark stool from her ostomy bag and associated worsening lethargy/weakness. On workup on admission she was found to have hemoglobin 3.9 g/dL.  This was noted to be 7.4 g/dL approximately 1 week prior. She underwent transfusion of 3 units PRBC and hemoglobin improved up to 9.4 g/dL.  GI was consulted on admission as well. She underwent EGD on 09/11/23 and was found to have erosive gastritis and 3 duodenal ulcers.  Interval History:  No events overnight.  No further bleeding.  Mentation remains improved and stable.  Daughter present bedside. Lower extremity edema has also improved.  Tolerating liquids and diet will be further advanced today.  Assessment and Plan: * Upper GI bleed - on asa/plavix in setting of CVA history and Right CEA for right carotid stenosis. Last dose plavix Wednesday - hx gastric ulcer 12/2021 - patient having dark stools from ostomy; FOBT positive - Hgb 3.9 g/dL on admission; transfused 3 units PRBC - Hgb improved and stable. Continue trending - GI following.  S/p EGD 10/25 (erosive gastritis and 3 duodenal ulcers non bleeding found; latter considered culprit of GIB) - protonix drip x 72 hrs then BID per GI rec's - I discussed DAPT with both vascular and neurology; patient is okay to resume either drug (doesn't need both) once holding period concludes; we can plan on resuming Plavix 10/27; avoiding asa going forward due to concern for contributing to ulcer formation from duodenal stricture  Acute encephalopathy-resolved as of 09/12/2023 - Suspect multifactorial due to  chronic opioids and severe anemia on admission - Mentation has improved - okay to resume oxy  AKI (acute kidney injury) (HCC)-resolved as of 09/12/2023 - baseline creatinine ~ 0.9 - patient presents with increase in creat >0.3 mg/dL above baseline or creat increase >1.5x baseline presumed to have occurred within past 7 days PTA - creat 1.2 on admission - s/p volume expansion with PRBC  Carotid artery stenosis, symptomatic, right - hx critical right carotid stenosis; underwent right CEA on 03/28/20 with Dr. Arbie Cookey  - has been on asa from vascular standpoint; see GIB  History of CVA (cerebrovascular accident) - on asa and plavix at home; currently on hold - see GIB - CVA 11/2019  Rectal cancer (HCC) - follows with Dr. Truett Perna -Off treatment for approximately 2 years with no clinical evidence of disease progression per oncology  Opioid dependence (HCC) - on xtampza outpt; verified on database - okay to resume   Chronic diastolic CHF (congestive heart failure) (HCC) - no s/s exac  Prolonged QT interval - QTc 664 -Trend electrolytes  Symptomatic anemia - see GIB  Iron deficiency anemia due to chronic blood loss - Ferritin low.  Iron stores seemingly adequate; low sat ratio but normal iron level  Essential hypertension - Meds on hold in setting of GIB  Uncontrolled type 2 diabetes mellitus with hypoglycemia and hyperglycemia (HCC) - Continue SSI and CBG monitoring  Bilateral lower extremity edema - Chronic - Intermittent history of cellulitis - RLE duplex negative for DVT  Hyperlipidemia associated with type 2 diabetes mellitus (HCC) - Statin on hold for now  Hypothyroidism -  Continue Synthroid   Old records reviewed in assessment of this patient  Antimicrobials:   DVT prophylaxis:  SCDs Start: 09/10/23 2304   Code Status:   Code Status: Full Code  Mobility Assessment (Last 72 Hours)     Mobility Assessment   No documentation.            Barriers to discharge: none Disposition Plan:  Home Status is: Inpt  Objective: Blood pressure (!) 156/38, pulse (!) 59, temperature 98 F (36.7 C), temperature source Oral, resp. rate 13, height 5' (1.524 m), weight 65.8 kg, SpO2 100%.  Examination:  Physical Exam Constitutional:      Appearance: Normal appearance.  HENT:     Head: Normocephalic and atraumatic.     Mouth/Throat:     Mouth: Mucous membranes are moist.  Eyes:     Extraocular Movements: Extraocular movements intact.  Cardiovascular:     Rate and Rhythm: Normal rate and regular rhythm.  Pulmonary:     Effort: Pulmonary effort is normal. No respiratory distress.     Breath sounds: Normal breath sounds. No wheezing.  Abdominal:     General: Bowel sounds are normal. There is no distension.     Palpations: Abdomen is soft.     Tenderness: There is no abdominal tenderness.     Comments: Ostomy bag in place with small amount of soft dark stool  Musculoskeletal:        General: Normal range of motion.     Cervical back: Normal range of motion and neck supple.  Skin:    General: Skin is warm and dry.  Neurological:     General: No focal deficit present.     Mental Status: She is alert.  Psychiatric:        Mood and Affect: Mood normal.      Consultants:  GI  Procedures:  10/25: EGD  Data Reviewed: Results for orders placed or performed during the hospital encounter of 09/10/23 (from the past 24 hour(s))  Glucose, capillary     Status: Abnormal   Collection Time: 09/11/23  4:24 PM  Result Value Ref Range   Glucose-Capillary 107 (H) 70 - 99 mg/dL  CBC     Status: Abnormal   Collection Time: 09/11/23  4:46 PM  Result Value Ref Range   WBC 6.1 4.0 - 10.5 K/uL   RBC 3.44 (L) 3.87 - 5.11 MIL/uL   Hemoglobin 9.9 (L) 12.0 - 15.0 g/dL   HCT 84.6 (L) 96.2 - 95.2 %   MCV 90.1 80.0 - 100.0 fL   MCH 28.8 26.0 - 34.0 pg   MCHC 31.9 30.0 - 36.0 g/dL   RDW 84.1 (H) 32.4 - 40.1 %   Platelets 173 150 - 400  K/uL   nRBC 0.0 0.0 - 0.2 %  Glucose, capillary     Status: Abnormal   Collection Time: 09/11/23  8:14 PM  Result Value Ref Range   Glucose-Capillary 227 (H) 70 - 99 mg/dL  CBC     Status: Abnormal   Collection Time: 09/11/23 11:35 PM  Result Value Ref Range   WBC 5.8 4.0 - 10.5 K/uL   RBC 3.33 (L) 3.87 - 5.11 MIL/uL   Hemoglobin 9.5 (L) 12.0 - 15.0 g/dL   HCT 02.7 (L) 25.3 - 66.4 %   MCV 91.0 80.0 - 100.0 fL   MCH 28.5 26.0 - 34.0 pg   MCHC 31.4 30.0 - 36.0 g/dL   RDW 40.3 (H) 47.4 - 25.9 %  Platelets 166 150 - 400 K/uL   nRBC 0.0 0.0 - 0.2 %  Glucose, capillary     Status: None   Collection Time: 09/12/23 12:16 AM  Result Value Ref Range   Glucose-Capillary 97 70 - 99 mg/dL  Glucose, capillary     Status: Abnormal   Collection Time: 09/12/23  4:06 AM  Result Value Ref Range   Glucose-Capillary 101 (H) 70 - 99 mg/dL  CBC     Status: Abnormal   Collection Time: 09/12/23  4:39 AM  Result Value Ref Range   WBC 5.7 4.0 - 10.5 K/uL   RBC 3.22 (L) 3.87 - 5.11 MIL/uL   Hemoglobin 9.2 (L) 12.0 - 15.0 g/dL   HCT 30.8 (L) 65.7 - 84.6 %   MCV 90.4 80.0 - 100.0 fL   MCH 28.6 26.0 - 34.0 pg   MCHC 31.6 30.0 - 36.0 g/dL   RDW 96.2 (H) 95.2 - 84.1 %   Platelets 161 150 - 400 K/uL   nRBC 0.0 0.0 - 0.2 %  Basic metabolic panel     Status: Abnormal   Collection Time: 09/12/23  4:39 AM  Result Value Ref Range   Sodium 140 135 - 145 mmol/L   Potassium 3.6 3.5 - 5.1 mmol/L   Chloride 110 98 - 111 mmol/L   CO2 23 22 - 32 mmol/L   Glucose, Bld 116 (H) 70 - 99 mg/dL   BUN 21 8 - 23 mg/dL   Creatinine, Ser 3.24 0.44 - 1.00 mg/dL   Calcium 8.0 (L) 8.9 - 10.3 mg/dL   GFR, Estimated >40 >10 mL/min   Anion gap 7 5 - 15  Magnesium     Status: None   Collection Time: 09/12/23  4:39 AM  Result Value Ref Range   Magnesium 1.9 1.7 - 2.4 mg/dL  Glucose, capillary     Status: Abnormal   Collection Time: 09/12/23  8:27 AM  Result Value Ref Range   Glucose-Capillary 114 (H) 70 - 99 mg/dL    Comment 1 Notify RN    Comment 2 Document in Chart   Glucose, capillary     Status: Abnormal   Collection Time: 09/12/23 11:24 AM  Result Value Ref Range   Glucose-Capillary 266 (H) 70 - 99 mg/dL   Comment 1 Notify RN    Comment 2 Document in Chart     I have reviewed pertinent nursing notes, vitals, labs, and images as necessary. I have ordered labwork to follow up on as indicated.  I have reviewed the last notes from staff over past 24 hours. I have discussed patient's care plan and test results with nursing staff, CM/SW, and other staff as appropriate.  Time spent: Greater than 50% of the 55 minute visit was spent in counseling/coordination of care for the patient as laid out in the A&P.   LOS: 2 days   Lewie Chamber, MD [facility] 09/12/2023, 3:03 PM

## 2023-09-12 NOTE — Assessment & Plan Note (Signed)
-   on xtampza outpt; verified on database - okay to resume

## 2023-09-12 NOTE — Progress Notes (Signed)
Progress Note    ASSESSMENT AND PLAN:    UGI bleed d/t duodenal ulcers. Hb 9.2 this AM. No further bleeding. Old melanotic stool in ostomy Duodenal strictures CVA on ASA/plavix. Last dose plavix 10/23 H/O rectal CA s/p colectomy/ostomy Multiple comorbidities  Plan: -Trend CBC. -Continue Protonix drip until tomorrow morning. Then can transition to Protonix PO 40 BID, if no recurrent bleeding and Hb is stable. -Advance diet -Avoid NSAIDs. -If no further bleeding, can resume Plavix 10/27. OK for monotherapy going forward per notes. Hold ASA-I think it may be getting lodged at duodenal stricture causing nonhealing ulcers. -Recommend repeat EGD in 12 weeks to ensure healing with +/- duo stricture dilation (if needed). She would need to be off Plavix x 5 days prior.      SUBJECTIVE   Feels much better Tolerating full liquid diet Told me that he was not sure if she was taking all her medicines properly as outpatient.  Her daughter does help out. No nausea, vomiting, abdominal pain  Wants to go home-I have told her not yet    OBJECTIVE:     Vital signs in last 24 hours: Temp:  [97.3 F (36.3 C)-98.6 F (37 C)] 98.6 F (37 C) (10/26 0844) Pulse Rate:  [55-72] 61 (10/26 0800) Resp:  [10-26] 13 (10/26 0800) BP: (107-194)/(25-85) 173/37 (10/26 0800) SpO2:  [97 %-100 %] 100 % (10/26 0800) Weight:  [65.8 kg] 65.8 kg (10/25 1300) Last BM Date : 09/10/23 General:   Alert, well-developed female in NAD EENT:  Normal hearing, non icteric sclera, conjunctive pink.  Heart:  Regular rate and rhythm; no murmur.  No lower extremity edema   Pulm: Normal respiratory effort, lungs CTA bilaterally without wheezes or crackles. Abdomen:  Soft, nondistended, nontender.  Normal bowel sounds, ostomy with minimal old melanotic stools.    Neurologic:  Alert and  oriented x4;  grossly normal neurologically. Psych:  Pleasant, cooperative.  Normal mood and affect.   Intake/Output from  previous day: 10/25 0701 - 10/26 0700 In: 641.4 [P.O.:120; I.V.:521.4] Out: 1850 [Urine:1800; Stool:50] Intake/Output this shift: No intake/output data recorded.  Lab Results: Recent Labs    09/11/23 1646 09/11/23 2335 09/12/23 0439  WBC 6.1 5.8 5.7  HGB 9.9* 9.5* 9.2*  HCT 31.0* 30.3* 29.1*  PLT 173 166 161   BMET Recent Labs    09/10/23 1647 09/11/23 0355 09/12/23 0439  NA 135 137 140  K 4.4 3.9 3.6  CL 105 109 110  CO2 21* 21* 23  GLUCOSE 156* 112* 116*  BUN 43* 31* 21  CREATININE 1.20* 0.98 0.92  CALCIUM 8.1* 7.9* 8.0*   LFT Recent Labs    09/11/23 0355  PROT 5.4*  ALBUMIN 2.9*  AST 14*  ALT 12  ALKPHOS 91  BILITOT 1.1   PT/INR No results for input(s): "LABPROT", "INR" in the last 72 hours. Hepatitis Panel No results for input(s): "HEPBSAG", "HCVAB", "HEPAIGM", "HEPBIGM" in the last 72 hours.  VAS [place] LOWER EXTREMITY VENOUS (DVT)  Result Date: 09/11/2023  Lower Venous DVT Study Patient Name:  [name]  Date of Exam:   09/11/2023 Medical Rec #: [id number]         Accession #:    [id number] Date of Birth: 03-30-1946         Patient Gender: F Patient Age:   77 years Exam Location:  [facility] Procedure:      VAS [place] LOWER EXTREMITY VENOUS (DVT) Referring Phys: Jonny Ruiz DOUTOVA --------------------------------------------------------------------------------  Indications: Edema.  Risk Factors: None identified. Limitations: Poor ultrasound/tissue interface and patient positioning, patient movement. Comparison Study: No prior studies. Performing Technologist: Chanda Busing RVT  Examination Guidelines: A complete evaluation includes B-mode imaging, spectral Doppler, color Doppler, and power Doppler as needed of all accessible portions of each vessel. Bilateral testing is considered an integral part of a complete examination. Limited examinations for reoccurring indications may be performed as noted. The reflux portion of the exam is performed with the  patient in reverse Trendelenburg.  +---------+---------------+---------+-----------+----------+--------------+ RIGHT    CompressibilityPhasicitySpontaneityPropertiesThrombus Aging +---------+---------------+---------+-----------+----------+--------------+ CFV      Full           Yes      Yes                                 +---------+---------------+---------+-----------+----------+--------------+ SFJ      Full                                                        +---------+---------------+---------+-----------+----------+--------------+ FV Prox  Full                                                        +---------+---------------+---------+-----------+----------+--------------+ FV Mid   Full                                                        +---------+---------------+---------+-----------+----------+--------------+ FV DistalFull                                                        +---------+---------------+---------+-----------+----------+--------------+ PFV      Full                                                        +---------+---------------+---------+-----------+----------+--------------+ POP      Full           Yes      Yes                                 +---------+---------------+---------+-----------+----------+--------------+ PTV      Full                                                        +---------+---------------+---------+-----------+----------+--------------+ PERO     Full                                                        +---------+---------------+---------+-----------+----------+--------------+   +----+---------------+---------+-----------+----------+--------------+  LEFTCompressibilityPhasicitySpontaneityPropertiesThrombus Aging +----+---------------+---------+-----------+----------+--------------+ CFV Full           Yes      Yes                                  +----+---------------+---------+-----------+----------+--------------+    Summary: RIGHT: - There is no evidence of deep vein thrombosis in the lower extremity.  - No cystic structure found in the popliteal fossa.  LEFT: - No evidence of common femoral vein obstruction.   *See table(s) above for measurements and observations. Electronically signed by Gerarda Fraction on 09/11/2023 at 5:01:55 PM.    Final    CT HEAD WO CONTRAST ( )  Result Date: 09/10/2023 CLINICAL DATA:  Altered mental status. EXAM: CT HEAD WITHOUT CONTRAST TECHNIQUE: Contiguous axial images were obtained from the base of the skull through the vertex without intravenous contrast. RADIATION DOSE REDUCTION: This exam was performed according to the departmental dose-optimization program which includes automated exposure control, adjustment of the mA and/or kV according to patient size and/or use of iterative reconstruction technique. COMPARISON:  CT scan head from 05/21/2023. FINDINGS: Brain: No evidence of acute infarction, hemorrhage, hydrocephalus, extra-axial collection or mass lesion/mass effect. Old lacunar infarct noted in the left corona radiata. There is bilateral periventricular hypodensity, which is non-specific but most likely seen in the settings of microvascular ischemic changes. Mild in extent. Otherwise normal appearance of brain parenchyma. Ventricles are normal. Cerebral volume is age appropriate. Vascular: No hyperdense vessel or unexpected calcification. Intracranial arteriosclerosis. Skull: Normal. Negative for fracture or focal lesion. Sinuses/Orbits: No acute finding. Other: Visualized mastoid air cells are unremarkable. No mastoid effusion. IMPRESSION: *No acute intracranial abnormality. Electronically Signed   By: Jules Schick M.D.   On: 09/10/2023 17:49   DG Chest Portable 1 View  Result Date: 09/10/2023 CLINICAL DATA:  Chest muscle spasm and pain. EXAM: PORTABLE CHEST 1 VIEW COMPARISON:  05/21/2023. FINDINGS:  Bilateral lung fields are clear. Mild prominence of interstitial markings is similar to the prior study. No overt pulmonary edema. Bilateral costophrenic angles are clear. Stable cardio-mediastinal silhouette. No acute osseous abnormalities. The soft tissues are within normal limits. Right-sided CT Port-A-Cath is again seen with its tip overlying the lower portion of superior vena cava. IMPRESSION: *No acute cardiopulmonary abnormality. Electronically Signed   By: Jules Schick M.D.   On: 09/10/2023 17:46     Principal Problem:   Upper GI bleed Active Problems:   Hypothyroidism   Hyperlipidemia associated with type 2 diabetes mellitus (HCC)   Bilateral lower extremity edema   Uncontrolled type 2 diabetes mellitus with hypoglycemia and hyperglycemia (HCC)   Essential hypertension   History of CVA (cerebrovascular accident)   Carotid artery stenosis, symptomatic, right   Iron deficiency anemia due to chronic blood loss   Rectal cancer (HCC)   AKI (acute kidney injury) (HCC)   Symptomatic anemia   Acute encephalopathy   Prolonged QT interval   Chronic diastolic CHF (congestive heart failure) (HCC)     LOS: 2 days     Edman Circle, MD 09/12/2023, 10:21 AM [facility] [phone]

## 2023-09-13 DIAGNOSIS — K922 Gastrointestinal hemorrhage, unspecified: Secondary | ICD-10-CM | POA: Diagnosis not present

## 2023-09-13 DIAGNOSIS — G9340 Encephalopathy, unspecified: Secondary | ICD-10-CM | POA: Diagnosis not present

## 2023-09-13 DIAGNOSIS — N179 Acute kidney failure, unspecified: Secondary | ICD-10-CM | POA: Diagnosis not present

## 2023-09-13 LAB — CBC
HCT: 32.2 % — ABNORMAL LOW (ref 36.0–46.0)
Hemoglobin: 10.1 g/dL — ABNORMAL LOW (ref 12.0–15.0)
MCH: 28.6 pg (ref 26.0–34.0)
MCHC: 31.4 g/dL (ref 30.0–36.0)
MCV: 91.2 fL (ref 80.0–100.0)
Platelets: 188 10*3/uL (ref 150–400)
RBC: 3.53 MIL/uL — ABNORMAL LOW (ref 3.87–5.11)
RDW: 15.7 % — ABNORMAL HIGH (ref 11.5–15.5)
WBC: 7.8 10*3/uL (ref 4.0–10.5)
nRBC: 0 % (ref 0.0–0.2)

## 2023-09-13 LAB — BASIC METABOLIC PANEL
Anion gap: 9 (ref 5–15)
BUN: 24 mg/dL — ABNORMAL HIGH (ref 8–23)
CO2: 22 mmol/L (ref 22–32)
Calcium: 8.3 mg/dL — ABNORMAL LOW (ref 8.9–10.3)
Chloride: 109 mmol/L (ref 98–111)
Creatinine, Ser: 0.92 mg/dL (ref 0.44–1.00)
GFR, Estimated: >60 mL/min (ref >60)
Glucose, Bld: 122 mg/dL — ABNORMAL HIGH (ref 70–99)
Potassium: 3.7 mmol/L (ref 3.5–5.1)
Sodium: 140 mmol/L (ref 135–145)

## 2023-09-13 LAB — MAGNESIUM: Magnesium: 2 mg/dL (ref 1.7–2.4)

## 2023-09-13 LAB — GLUCOSE, CAPILLARY
Glucose-Capillary: 105 mg/dL — ABNORMAL HIGH (ref 70–99)
Glucose-Capillary: 143 mg/dL — ABNORMAL HIGH (ref 70–99)
Glucose-Capillary: 166 mg/dL — ABNORMAL HIGH (ref 70–99)
Glucose-Capillary: 214 mg/dL — ABNORMAL HIGH (ref 70–99)

## 2023-09-13 MED ORDER — PANTOPRAZOLE SODIUM 40 MG PO TBEC: 40.0000 mg | DELAYED_RELEASE_TABLET | Freq: Two times a day (BID) | ORAL | 3 refills | Status: DC

## 2023-09-13 MED ORDER — CLOPIDOGREL BISULFATE 75 MG PO TABS
75.0000 mg | ORAL_TABLET | Freq: Every day | ORAL | Status: DC
  Administered 2023-09-13: 75 mg via ORAL
  Filled 2023-09-13: qty 1

## 2023-09-13 MED ORDER — HEPARIN SOD (PORK) LOCK FLUSH 100 UNIT/ML IV SOLN
500.0000 [IU] | INTRAVENOUS | Status: AC | PRN
  Administered 2023-09-13: 500 [IU]

## 2023-09-13 MED ORDER — PANTOPRAZOLE SODIUM 40 MG PO TBEC: 40.0000 mg | DELAYED_RELEASE_TABLET | Freq: Two times a day (BID) | ORAL | Status: DC

## 2023-09-13 NOTE — Discharge Summary (Signed)
Physician Discharge Summary   [name] QQV:[id number] DOB: [birth year] DOA: 09/10/2023  PCP: [name] Moccasin, MD  Admit date: 09/10/2023 Discharge date: 09/13/2023   Admitted From: Home Disposition:  Home Discharging physician: Lewie Chamber, MD Barriers to discharge: none  Recommendations at discharge: Follow up with GI for repeat EGD in ~12 weeks Continued on monotherapy Plavix at discharge. ASA was discontinued Repeat CBC and BMP and follow up  Discharge Condition: stable CODE STATUS: Full Diet recommendation:  Diet Orders (From admission, onward)     Start     Ordered   09/12/23 1400  DIET SOFT Room service appropriate? Yes; Fluid consistency: Thin  Diet effective now       Question Answer Comment  Room service appropriate? Yes   Fluid consistency: Thin      09/12/23 1359            Hospital Course: [name] is a 77 yo female with PMH rectal/cervical adenocarcinoma now with colostomy, DMII, arthritis, CVA on Plavix, right carotid stenosis s/p endarterectomy 03/28/20, hypothyroidism, HLD who presented with severe weakness.  She was having dark stool from her ostomy bag and associated worsening lethargy/weakness. On workup on admission she was found to have hemoglobin 3.9 g/dL.  This was noted to be 7.4 g/dL approximately 1 week prior. She underwent transfusion of 3 units PRBC and hemoglobin improved up to 9.4 g/dL.  GI was consulted on admission as well. She underwent EGD on 09/11/23 and was found to have erosive gastritis and 3 duodenal ulcers.  Diet was slowly advanced and hemoglobin remained stable. She was continued on Protonix at discharge.  Aspirin was discontinued altogether and Plavix was resumed.  If does have recurrent GI bleeding, may need to be taken off of antiplatelet therapy altogether. Follow-up with GI planned for repeat EGD in approximately 12 weeks.  Assessment and Plan: * Upper GI bleed-resolved as of 09/13/2023 - on asa/plavix in  setting of CVA history and Right CEA for right carotid stenosis. Last dose plavix Wednesday - hx gastric ulcer 12/2021 - patient having dark stools from ostomy; FOBT positive - Hgb 3.9 g/dL on admission; transfused 3 units PRBC - Hgb improved and stable. Continue trending - GI following.  S/p EGD 10/25 (erosive gastritis and 3 duodenal ulcers non bleeding found; latter considered culprit of GIB) - protonix drip x 72 hrs then BID per GI rec's - I discussed DAPT with both vascular and neurology; patient is okay to resume either drug (doesn't need both) once holding period concludes; we can plan on resuming Plavix 10/27; avoiding asa going forward due to concern for contributing to ulcer formation from duodenal stricture (per GI)  Acute encephalopathy-resolved as of 09/12/2023 - Suspect multifactorial due to chronic opioids and severe anemia on admission - Mentation has improved - okay to resume oxy  AKI (acute kidney injury) (HCC)-resolved as of 09/12/2023 - baseline creatinine ~ 0.9 - patient presents with increase in creat >0.3 mg/dL above baseline or creat increase >1.5x baseline presumed to have occurred within past 7 days PTA - creat 1.2 on admission - s/p volume expansion with PRBC -Returned back to normal/baseline prior to discharge, 0.92  Carotid artery stenosis, symptomatic, right - hx critical right carotid stenosis; underwent right CEA on 03/28/20 with Dr. Arbie Cookey  - has been on asa from vascular standpoint; see GIB  History of CVA (cerebrovascular accident) - on asa and plavix at home; currently on hold - see GIB - CVA 11/2019  Rectal cancer (HCC) -  149* 173 166 161 188   < > = values in this interval not displayed.   Cardiac Enzymes: No results for input(s): "CKTOTAL", "CKMB", "CKMBINDEX", "TROPONINI" in the last 168 hours. BNP: Invalid input(s): "POCBNP" CBG: Recent Labs  Lab 09/12/23 1958 09/13/23 0000 09/13/23 0422 09/13/23 0755 09/13/23 1227  GLUCAP 145* 166* 105* 143* 214*   D-Dimer No results for input(s): "DDIMER" in the last 72 hours. Hgb A1c Recent Labs    09/10/23 1814  HGBA1C 5.7*   Lipid Profile No results for input(s): "CHOL", "HDL", "LDLCALC", "TRIG", "CHOLHDL", "LDLDIRECT" in the last 72 hours. Thyroid  function studies Recent Labs    09/11/23 1043  TSH 3.253   Anemia work up Recent Labs    09/11/23 1043  FERRITIN 14  TIBC 402  IRON 37   Urinalysis    Component Value Date/Time   COLORURINE COLORLESS (A) 09/10/2023 1944   APPEARANCEUR CLEAR 09/10/2023 1944   LABSPEC 1.011 09/10/2023 1944   PHURINE 6.0 09/10/2023 1944   GLUCOSEU NEGATIVE 09/10/2023 1944   HGBUR NEGATIVE 09/10/2023 1944   BILIRUBINUR NEGATIVE 09/10/2023 1944   KETONESUR NEGATIVE 09/10/2023 1944   PROTEINUR NEGATIVE 09/10/2023 1944   UROBILINOGEN 0.2 05/27/2014 1203   NITRITE NEGATIVE 09/10/2023 1944   LEUKOCYTESUR NEGATIVE 09/10/2023 1944   Sepsis Labs Recent Labs  Lab 09/11/23 1646 09/11/23 2335 09/12/23 0439 09/13/23 0257  WBC 6.1 5.8 5.7 7.8   Microbiology Recent Results (from the past 240 hour(s))  Urine Culture     Status: Abnormal   Collection Time: 09/05/23  7:53 PM   Specimen: Urine, Clean Catch  Result Value Ref Range Status   Specimen Description   Final    URINE, CLEAN CATCH Performed at [facility], [address] [place], [place] [address]    Special Requests   Final    NONE Performed at [facility], [address], [place], [place] [address]    Culture MULTIPLE SPECIES PRESENT, SUGGEST RECOLLECTION (A)  Final   Report Status 09/07/2023 FINAL  Final  MRSA Next Gen by PCR, Nasal     Status: None   Collection Time: 09/10/23 11:08 PM   Specimen: Nasal Mucosa; Nasal Swab  Result Value Ref Range Status   MRSA by PCR Next Gen NOT DETECTED NOT DETECTED Final    Comment: (NOTE) The GeneXpert MRSA Assay (FDA approved for NASAL specimens only), is one component of a comprehensive MRSA colonization surveillance program. It is not intended to diagnose MRSA infection nor to guide or monitor treatment for MRSA infections. Test performance is not FDA approved in patients less than 39 years old. Performed at [facility], [address] [place], [place] [address]     Procedures/Studies: VAS [place] LOWER EXTREMITY VENOUS (DVT)  Result Date: 09/11/2023  Lower Venous DVT Study Patient Name:  BETTYJO HOLLETT  Date of Exam:   09/11/2023 Medical Rec #: [id number]         Accession #:    [id number] Date of Birth: 04-Sep-1946         Patient Gender: F Patient Age:   38 years Exam Location:  [facility] Procedure:      VAS [place] LOWER EXTREMITY VENOUS (DVT) Referring Phys: Jonny Ruiz DOUTOVA --------------------------------------------------------------------------------  Indications: Edema.  Risk Factors: None identified. Limitations: Poor ultrasound/tissue interface and patient positioning, patient movement. Comparison Study: No prior studies. Performing Technologist: Chanda Busing RVT  Examination Guidelines: A complete evaluation includes B-mode imaging, spectral Doppler, color  Physician Discharge Summary   [name] QQV:[id number] DOB: [birth year] DOA: 09/10/2023  PCP: [name] Moccasin, MD  Admit date: 09/10/2023 Discharge date: 09/13/2023   Admitted From: Home Disposition:  Home Discharging physician: Lewie Chamber, MD Barriers to discharge: none  Recommendations at discharge: Follow up with GI for repeat EGD in ~12 weeks Continued on monotherapy Plavix at discharge. ASA was discontinued Repeat CBC and BMP and follow up  Discharge Condition: stable CODE STATUS: Full Diet recommendation:  Diet Orders (From admission, onward)     Start     Ordered   09/12/23 1400  DIET SOFT Room service appropriate? Yes; Fluid consistency: Thin  Diet effective now       Question Answer Comment  Room service appropriate? Yes   Fluid consistency: Thin      09/12/23 1359            Hospital Course: [name] is a 77 yo female with PMH rectal/cervical adenocarcinoma now with colostomy, DMII, arthritis, CVA on Plavix, right carotid stenosis s/p endarterectomy 03/28/20, hypothyroidism, HLD who presented with severe weakness.  She was having dark stool from her ostomy bag and associated worsening lethargy/weakness. On workup on admission she was found to have hemoglobin 3.9 g/dL.  This was noted to be 7.4 g/dL approximately 1 week prior. She underwent transfusion of 3 units PRBC and hemoglobin improved up to 9.4 g/dL.  GI was consulted on admission as well. She underwent EGD on 09/11/23 and was found to have erosive gastritis and 3 duodenal ulcers.  Diet was slowly advanced and hemoglobin remained stable. She was continued on Protonix at discharge.  Aspirin was discontinued altogether and Plavix was resumed.  If does have recurrent GI bleeding, may need to be taken off of antiplatelet therapy altogether. Follow-up with GI planned for repeat EGD in approximately 12 weeks.  Assessment and Plan: * Upper GI bleed-resolved as of 09/13/2023 - on asa/plavix in  setting of CVA history and Right CEA for right carotid stenosis. Last dose plavix Wednesday - hx gastric ulcer 12/2021 - patient having dark stools from ostomy; FOBT positive - Hgb 3.9 g/dL on admission; transfused 3 units PRBC - Hgb improved and stable. Continue trending - GI following.  S/p EGD 10/25 (erosive gastritis and 3 duodenal ulcers non bleeding found; latter considered culprit of GIB) - protonix drip x 72 hrs then BID per GI rec's - I discussed DAPT with both vascular and neurology; patient is okay to resume either drug (doesn't need both) once holding period concludes; we can plan on resuming Plavix 10/27; avoiding asa going forward due to concern for contributing to ulcer formation from duodenal stricture (per GI)  Acute encephalopathy-resolved as of 09/12/2023 - Suspect multifactorial due to chronic opioids and severe anemia on admission - Mentation has improved - okay to resume oxy  AKI (acute kidney injury) (HCC)-resolved as of 09/12/2023 - baseline creatinine ~ 0.9 - patient presents with increase in creat >0.3 mg/dL above baseline or creat increase >1.5x baseline presumed to have occurred within past 7 days PTA - creat 1.2 on admission - s/p volume expansion with PRBC -Returned back to normal/baseline prior to discharge, 0.92  Carotid artery stenosis, symptomatic, right - hx critical right carotid stenosis; underwent right CEA on 03/28/20 with Dr. Arbie Cookey  - has been on asa from vascular standpoint; see GIB  History of CVA (cerebrovascular accident) - on asa and plavix at home; currently on hold - see GIB - CVA 11/2019  Rectal cancer (HCC) -  149* 173 166 161 188   < > = values in this interval not displayed.   Cardiac Enzymes: No results for input(s): "CKTOTAL", "CKMB", "CKMBINDEX", "TROPONINI" in the last 168 hours. BNP: Invalid input(s): "POCBNP" CBG: Recent Labs  Lab 09/12/23 1958 09/13/23 0000 09/13/23 0422 09/13/23 0755 09/13/23 1227  GLUCAP 145* 166* 105* 143* 214*   D-Dimer No results for input(s): "DDIMER" in the last 72 hours. Hgb A1c Recent Labs    09/10/23 1814  HGBA1C 5.7*   Lipid Profile No results for input(s): "CHOL", "HDL", "LDLCALC", "TRIG", "CHOLHDL", "LDLDIRECT" in the last 72 hours. Thyroid  function studies Recent Labs    09/11/23 1043  TSH 3.253   Anemia work up Recent Labs    09/11/23 1043  FERRITIN 14  TIBC 402  IRON 37   Urinalysis    Component Value Date/Time   COLORURINE COLORLESS (A) 09/10/2023 1944   APPEARANCEUR CLEAR 09/10/2023 1944   LABSPEC 1.011 09/10/2023 1944   PHURINE 6.0 09/10/2023 1944   GLUCOSEU NEGATIVE 09/10/2023 1944   HGBUR NEGATIVE 09/10/2023 1944   BILIRUBINUR NEGATIVE 09/10/2023 1944   KETONESUR NEGATIVE 09/10/2023 1944   PROTEINUR NEGATIVE 09/10/2023 1944   UROBILINOGEN 0.2 05/27/2014 1203   NITRITE NEGATIVE 09/10/2023 1944   LEUKOCYTESUR NEGATIVE 09/10/2023 1944   Sepsis Labs Recent Labs  Lab 09/11/23 1646 09/11/23 2335 09/12/23 0439 09/13/23 0257  WBC 6.1 5.8 5.7 7.8   Microbiology Recent Results (from the past 240 hour(s))  Urine Culture     Status: Abnormal   Collection Time: 09/05/23  7:53 PM   Specimen: Urine, Clean Catch  Result Value Ref Range Status   Specimen Description   Final    URINE, CLEAN CATCH Performed at [facility], [address] [place], [place] [address]    Special Requests   Final    NONE Performed at [facility], [address], [place], [place] [address]    Culture MULTIPLE SPECIES PRESENT, SUGGEST RECOLLECTION (A)  Final   Report Status 09/07/2023 FINAL  Final  MRSA Next Gen by PCR, Nasal     Status: None   Collection Time: 09/10/23 11:08 PM   Specimen: Nasal Mucosa; Nasal Swab  Result Value Ref Range Status   MRSA by PCR Next Gen NOT DETECTED NOT DETECTED Final    Comment: (NOTE) The GeneXpert MRSA Assay (FDA approved for NASAL specimens only), is one component of a comprehensive MRSA colonization surveillance program. It is not intended to diagnose MRSA infection nor to guide or monitor treatment for MRSA infections. Test performance is not FDA approved in patients less than 39 years old. Performed at [facility], [address] [place], [place] [address]     Procedures/Studies: VAS [place] LOWER EXTREMITY VENOUS (DVT)  Result Date: 09/11/2023  Lower Venous DVT Study Patient Name:  BETTYJO HOLLETT  Date of Exam:   09/11/2023 Medical Rec #: [id number]         Accession #:    [id number] Date of Birth: 04-Sep-1946         Patient Gender: F Patient Age:   38 years Exam Location:  [facility] Procedure:      VAS [place] LOWER EXTREMITY VENOUS (DVT) Referring Phys: Jonny Ruiz DOUTOVA --------------------------------------------------------------------------------  Indications: Edema.  Risk Factors: None identified. Limitations: Poor ultrasound/tissue interface and patient positioning, patient movement. Comparison Study: No prior studies. Performing Technologist: Chanda Busing RVT  Examination Guidelines: A complete evaluation includes B-mode imaging, spectral Doppler, color  Physician Discharge Summary   [name] QQV:[id number] DOB: [birth year] DOA: 09/10/2023  PCP: [name] Moccasin, MD  Admit date: 09/10/2023 Discharge date: 09/13/2023   Admitted From: Home Disposition:  Home Discharging physician: Lewie Chamber, MD Barriers to discharge: none  Recommendations at discharge: Follow up with GI for repeat EGD in ~12 weeks Continued on monotherapy Plavix at discharge. ASA was discontinued Repeat CBC and BMP and follow up  Discharge Condition: stable CODE STATUS: Full Diet recommendation:  Diet Orders (From admission, onward)     Start     Ordered   09/12/23 1400  DIET SOFT Room service appropriate? Yes; Fluid consistency: Thin  Diet effective now       Question Answer Comment  Room service appropriate? Yes   Fluid consistency: Thin      09/12/23 1359            Hospital Course: [name] is a 77 yo female with PMH rectal/cervical adenocarcinoma now with colostomy, DMII, arthritis, CVA on Plavix, right carotid stenosis s/p endarterectomy 03/28/20, hypothyroidism, HLD who presented with severe weakness.  She was having dark stool from her ostomy bag and associated worsening lethargy/weakness. On workup on admission she was found to have hemoglobin 3.9 g/dL.  This was noted to be 7.4 g/dL approximately 1 week prior. She underwent transfusion of 3 units PRBC and hemoglobin improved up to 9.4 g/dL.  GI was consulted on admission as well. She underwent EGD on 09/11/23 and was found to have erosive gastritis and 3 duodenal ulcers.  Diet was slowly advanced and hemoglobin remained stable. She was continued on Protonix at discharge.  Aspirin was discontinued altogether and Plavix was resumed.  If does have recurrent GI bleeding, may need to be taken off of antiplatelet therapy altogether. Follow-up with GI planned for repeat EGD in approximately 12 weeks.  Assessment and Plan: * Upper GI bleed-resolved as of 09/13/2023 - on asa/plavix in  setting of CVA history and Right CEA for right carotid stenosis. Last dose plavix Wednesday - hx gastric ulcer 12/2021 - patient having dark stools from ostomy; FOBT positive - Hgb 3.9 g/dL on admission; transfused 3 units PRBC - Hgb improved and stable. Continue trending - GI following.  S/p EGD 10/25 (erosive gastritis and 3 duodenal ulcers non bleeding found; latter considered culprit of GIB) - protonix drip x 72 hrs then BID per GI rec's - I discussed DAPT with both vascular and neurology; patient is okay to resume either drug (doesn't need both) once holding period concludes; we can plan on resuming Plavix 10/27; avoiding asa going forward due to concern for contributing to ulcer formation from duodenal stricture (per GI)  Acute encephalopathy-resolved as of 09/12/2023 - Suspect multifactorial due to chronic opioids and severe anemia on admission - Mentation has improved - okay to resume oxy  AKI (acute kidney injury) (HCC)-resolved as of 09/12/2023 - baseline creatinine ~ 0.9 - patient presents with increase in creat >0.3 mg/dL above baseline or creat increase >1.5x baseline presumed to have occurred within past 7 days PTA - creat 1.2 on admission - s/p volume expansion with PRBC -Returned back to normal/baseline prior to discharge, 0.92  Carotid artery stenosis, symptomatic, right - hx critical right carotid stenosis; underwent right CEA on 03/28/20 with Dr. Arbie Cookey  - has been on asa from vascular standpoint; see GIB  History of CVA (cerebrovascular accident) - on asa and plavix at home; currently on hold - see GIB - CVA 11/2019  Rectal cancer (HCC) -  Physician Discharge Summary   [name] QQV:[id number] DOB: [birth year] DOA: 09/10/2023  PCP: [name] Moccasin, MD  Admit date: 09/10/2023 Discharge date: 09/13/2023   Admitted From: Home Disposition:  Home Discharging physician: Lewie Chamber, MD Barriers to discharge: none  Recommendations at discharge: Follow up with GI for repeat EGD in ~12 weeks Continued on monotherapy Plavix at discharge. ASA was discontinued Repeat CBC and BMP and follow up  Discharge Condition: stable CODE STATUS: Full Diet recommendation:  Diet Orders (From admission, onward)     Start     Ordered   09/12/23 1400  DIET SOFT Room service appropriate? Yes; Fluid consistency: Thin  Diet effective now       Question Answer Comment  Room service appropriate? Yes   Fluid consistency: Thin      09/12/23 1359            Hospital Course: [name] is a 77 yo female with PMH rectal/cervical adenocarcinoma now with colostomy, DMII, arthritis, CVA on Plavix, right carotid stenosis s/p endarterectomy 03/28/20, hypothyroidism, HLD who presented with severe weakness.  She was having dark stool from her ostomy bag and associated worsening lethargy/weakness. On workup on admission she was found to have hemoglobin 3.9 g/dL.  This was noted to be 7.4 g/dL approximately 1 week prior. She underwent transfusion of 3 units PRBC and hemoglobin improved up to 9.4 g/dL.  GI was consulted on admission as well. She underwent EGD on 09/11/23 and was found to have erosive gastritis and 3 duodenal ulcers.  Diet was slowly advanced and hemoglobin remained stable. She was continued on Protonix at discharge.  Aspirin was discontinued altogether and Plavix was resumed.  If does have recurrent GI bleeding, may need to be taken off of antiplatelet therapy altogether. Follow-up with GI planned for repeat EGD in approximately 12 weeks.  Assessment and Plan: * Upper GI bleed-resolved as of 09/13/2023 - on asa/plavix in  setting of CVA history and Right CEA for right carotid stenosis. Last dose plavix Wednesday - hx gastric ulcer 12/2021 - patient having dark stools from ostomy; FOBT positive - Hgb 3.9 g/dL on admission; transfused 3 units PRBC - Hgb improved and stable. Continue trending - GI following.  S/p EGD 10/25 (erosive gastritis and 3 duodenal ulcers non bleeding found; latter considered culprit of GIB) - protonix drip x 72 hrs then BID per GI rec's - I discussed DAPT with both vascular and neurology; patient is okay to resume either drug (doesn't need both) once holding period concludes; we can plan on resuming Plavix 10/27; avoiding asa going forward due to concern for contributing to ulcer formation from duodenal stricture (per GI)  Acute encephalopathy-resolved as of 09/12/2023 - Suspect multifactorial due to chronic opioids and severe anemia on admission - Mentation has improved - okay to resume oxy  AKI (acute kidney injury) (HCC)-resolved as of 09/12/2023 - baseline creatinine ~ 0.9 - patient presents with increase in creat >0.3 mg/dL above baseline or creat increase >1.5x baseline presumed to have occurred within past 7 days PTA - creat 1.2 on admission - s/p volume expansion with PRBC -Returned back to normal/baseline prior to discharge, 0.92  Carotid artery stenosis, symptomatic, right - hx critical right carotid stenosis; underwent right CEA on 03/28/20 with Dr. Arbie Cookey  - has been on asa from vascular standpoint; see GIB  History of CVA (cerebrovascular accident) - on asa and plavix at home; currently on hold - see GIB - CVA 11/2019  Rectal cancer (HCC) -  Physician Discharge Summary   [name] QQV:[id number] DOB: [birth year] DOA: 09/10/2023  PCP: [name] Moccasin, MD  Admit date: 09/10/2023 Discharge date: 09/13/2023   Admitted From: Home Disposition:  Home Discharging physician: Lewie Chamber, MD Barriers to discharge: none  Recommendations at discharge: Follow up with GI for repeat EGD in ~12 weeks Continued on monotherapy Plavix at discharge. ASA was discontinued Repeat CBC and BMP and follow up  Discharge Condition: stable CODE STATUS: Full Diet recommendation:  Diet Orders (From admission, onward)     Start     Ordered   09/12/23 1400  DIET SOFT Room service appropriate? Yes; Fluid consistency: Thin  Diet effective now       Question Answer Comment  Room service appropriate? Yes   Fluid consistency: Thin      09/12/23 1359            Hospital Course: [name] is a 77 yo female with PMH rectal/cervical adenocarcinoma now with colostomy, DMII, arthritis, CVA on Plavix, right carotid stenosis s/p endarterectomy 03/28/20, hypothyroidism, HLD who presented with severe weakness.  She was having dark stool from her ostomy bag and associated worsening lethargy/weakness. On workup on admission she was found to have hemoglobin 3.9 g/dL.  This was noted to be 7.4 g/dL approximately 1 week prior. She underwent transfusion of 3 units PRBC and hemoglobin improved up to 9.4 g/dL.  GI was consulted on admission as well. She underwent EGD on 09/11/23 and was found to have erosive gastritis and 3 duodenal ulcers.  Diet was slowly advanced and hemoglobin remained stable. She was continued on Protonix at discharge.  Aspirin was discontinued altogether and Plavix was resumed.  If does have recurrent GI bleeding, may need to be taken off of antiplatelet therapy altogether. Follow-up with GI planned for repeat EGD in approximately 12 weeks.  Assessment and Plan: * Upper GI bleed-resolved as of 09/13/2023 - on asa/plavix in  setting of CVA history and Right CEA for right carotid stenosis. Last dose plavix Wednesday - hx gastric ulcer 12/2021 - patient having dark stools from ostomy; FOBT positive - Hgb 3.9 g/dL on admission; transfused 3 units PRBC - Hgb improved and stable. Continue trending - GI following.  S/p EGD 10/25 (erosive gastritis and 3 duodenal ulcers non bleeding found; latter considered culprit of GIB) - protonix drip x 72 hrs then BID per GI rec's - I discussed DAPT with both vascular and neurology; patient is okay to resume either drug (doesn't need both) once holding period concludes; we can plan on resuming Plavix 10/27; avoiding asa going forward due to concern for contributing to ulcer formation from duodenal stricture (per GI)  Acute encephalopathy-resolved as of 09/12/2023 - Suspect multifactorial due to chronic opioids and severe anemia on admission - Mentation has improved - okay to resume oxy  AKI (acute kidney injury) (HCC)-resolved as of 09/12/2023 - baseline creatinine ~ 0.9 - patient presents with increase in creat >0.3 mg/dL above baseline or creat increase >1.5x baseline presumed to have occurred within past 7 days PTA - creat 1.2 on admission - s/p volume expansion with PRBC -Returned back to normal/baseline prior to discharge, 0.92  Carotid artery stenosis, symptomatic, right - hx critical right carotid stenosis; underwent right CEA on 03/28/20 with Dr. Arbie Cookey  - has been on asa from vascular standpoint; see GIB  History of CVA (cerebrovascular accident) - on asa and plavix at home; currently on hold - see GIB - CVA 11/2019  Rectal cancer (HCC) -

## 2023-09-13 NOTE — Progress Notes (Signed)
Progress Note    ASSESSMENT AND PLAN:    UGI bleed d/t duodenal ulcers. Hb 10 this AM. No further bleeding. Old melanotic stool in ostomy Duodenal strictures CVA on ASA/plavix. Last dose plavix 10/23 H/O rectal CA s/p colectomy/ostomy Multiple comorbidities   Plan: -Protonix PO 40 BID -Avoid NSAIDs. -Resume Plavix today. OK for monotherapy going forward per notes. Hold ASA-I think it may be getting lodged at duodenal stricture causing nonhealing ulcers. -OK to D/C home today with close FU -FU GI in 4-6 weeks. She will call to make appt. She has appt with Nida Boatman tomorrow. -Recommend repeat EGD in 12 weeks to ensure healing with +/- duo stricture dilation (if needed). She would need to be off Plavix x 5 days prior.     SUBJECTIVE   No further bleeding She is tolerating p.o. well No nausea or vomiting Denies use of over-the-counter nonsteroidals Wants to go home.  She has appoint with Dr. Alcide Evener tomorrow    OBJECTIVE:     Vital signs in last 24 hours: Temp:  [97.2 F (36.2 C)-98.5 F (36.9 C)] 98.5 F (36.9 C) (10/27 0840) Pulse Rate:  [59-88] 88 (10/27 0840) Resp:  [8-21] 19 (10/27 0840) BP: (132-176)/(27-91) 143/56 (10/27 0840) SpO2:  [98 %-100 %] 98 % (10/27 0840) Last BM Date : 09/10/23 General:   Alert, well-developed female in NAD EENT:  Normal hearing, non icteric sclera, conjunctive pink.  Heart:  Regular rate and rhythm; no murmur.  No lower extremity edema   Pulm: Normal respiratory effort, lungs CTA bilaterally without wheezes or crackles. Abdomen:  Soft, nondistended, nontender.  Normal bowel sounds, ostomy with air and dark stool-better than before      Neurologic:  Alert and  oriented x4;  grossly normal neurologically. Psych:  Pleasant, cooperative.  Normal mood and affect.   Intake/Output from previous day: 10/26 0701 - 10/27 0700 In: 50 [P.O.:50] Out: 1200 [Urine:1200] Intake/Output this shift: Total I/O In: 120 [P.O.:120] Out: -    Lab Results: Recent Labs    09/11/23 2335 09/12/23 0439 09/13/23 0257  WBC 5.8 5.7 7.8  HGB 9.5* 9.2* 10.1*  HCT 30.3* 29.1* 32.2*  PLT 166 161 188   BMET Recent Labs    09/11/23 0355 09/12/23 0439 09/13/23 0257  NA 137 140 140  K 3.9 3.6 3.7  CL 109 110 109  CO2 21* 23 22  GLUCOSE 112* 116* 122*  BUN 31* 21 24*  CREATININE 0.98 0.92 0.92  CALCIUM 7.9* 8.0* 8.3*   LFT Recent Labs    09/11/23 0355  PROT 5.4*  ALBUMIN 2.9*  AST 14*  ALT 12  ALKPHOS 91  BILITOT 1.1   PT/INR No results for input(s): "LABPROT", "INR" in the last 72 hours. Hepatitis Panel No results for input(s): "HEPBSAG", "HCVAB", "HEPAIGM", "HEPBIGM" in the last 72 hours.  VAS [place] LOWER EXTREMITY VENOUS (DVT)  Result Date: 09/11/2023  Lower Venous DVT Study Patient Name:  [name]  Date of Exam:   09/11/2023 Medical Rec #: [id number]         Accession #:    [id number] Date of Birth: 1946-05-12         Patient Gender: F Patient Age:   77 years Exam Location:  [facility] Procedure:      VAS [place] LOWER EXTREMITY VENOUS (DVT) Referring Phys: Jonny Ruiz DOUTOVA --------------------------------------------------------------------------------  Indications: Edema.  Risk Factors: None identified. Limitations: Poor ultrasound/tissue interface and patient positioning, patient movement. Comparison Study: No  prior studies. Performing Technologist: Chanda Busing RVT  Examination Guidelines: A complete evaluation includes B-mode imaging, spectral Doppler, color Doppler, and power Doppler as needed of all accessible portions of each vessel. Bilateral testing is considered an integral part of a complete examination. Limited examinations for reoccurring indications may be performed as noted. The reflux portion of the exam is performed with the patient in reverse Trendelenburg.  +---------+---------------+---------+-----------+----------+--------------+ RIGHT     CompressibilityPhasicitySpontaneityPropertiesThrombus Aging +---------+---------------+---------+-----------+----------+--------------+ CFV      Full           Yes      Yes                                 +---------+---------------+---------+-----------+----------+--------------+ SFJ      Full                                                        +---------+---------------+---------+-----------+----------+--------------+ FV Prox  Full                                                        +---------+---------------+---------+-----------+----------+--------------+ FV Mid   Full                                                        +---------+---------------+---------+-----------+----------+--------------+ FV DistalFull                                                        +---------+---------------+---------+-----------+----------+--------------+ PFV      Full                                                        +---------+---------------+---------+-----------+----------+--------------+ POP      Full           Yes      Yes                                 +---------+---------------+---------+-----------+----------+--------------+ PTV      Full                                                        +---------+---------------+---------+-----------+----------+--------------+ PERO     Full                                                        +---------+---------------+---------+-----------+----------+--------------+   +----+---------------+---------+-----------+----------+--------------+  LEFTCompressibilityPhasicitySpontaneityPropertiesThrombus Aging +----+---------------+---------+-----------+----------+--------------+ CFV Full           Yes      Yes                                 +----+---------------+---------+-----------+----------+--------------+    Summary: RIGHT: - There is no evidence of deep vein thrombosis in the lower  extremity.  - No cystic structure found in the popliteal fossa.  LEFT: - No evidence of common femoral vein obstruction.   *See table(s) above for measurements and observations. Electronically signed by Gerarda Fraction on 09/11/2023 at 5:01:55 PM.    Final      Principal Problem:   Upper GI bleed Active Problems:   Hypothyroidism   Hyperlipidemia associated with type 2 diabetes mellitus (HCC)   Bilateral lower extremity edema   Uncontrolled type 2 diabetes mellitus with hypoglycemia and hyperglycemia (HCC)   Essential hypertension   History of CVA (cerebrovascular accident)   Carotid artery stenosis, symptomatic, right   Iron deficiency anemia due to chronic blood loss   Rectal cancer (HCC)   Symptomatic anemia   Prolonged QT interval   Chronic diastolic CHF (congestive heart failure) (HCC)   Opioid dependence (HCC)     LOS: 3 days     Edman Circle, MD 09/13/2023, 9:58 AM [facility] [phone]

## 2023-09-14 ENCOUNTER — Other Ambulatory Visit: Payer: Self-pay | Admitting: *Deleted

## 2023-09-14 ENCOUNTER — Encounter (HOSPITAL_COMMUNITY): Payer: Self-pay | Admitting: Gastroenterology

## 2023-09-14 LAB — BPAM RBC
Blood Product Expiration Date: 202411212359
Blood Product Expiration Date: 202411212359
Blood Product Expiration Date: 202411212359
ISSUE DATE / TIME: 202410242038
ISSUE DATE / TIME: 202410242339
ISSUE DATE / TIME: 202410250147
Unit Type and Rh: 5100
Unit Type and Rh: 5100
Unit Type and Rh: 5100

## 2023-09-14 LAB — TYPE AND SCREEN
ABO/RH(D): O POS
Antibody Screen: NEGATIVE
Unit division: 0
Unit division: 0
Unit division: 0

## 2023-09-14 NOTE — Progress Notes (Signed)
Hospital d/c. Needs f/u in ~ 3 weeks-appt is in. Will add flush

## 2023-09-15 LAB — SURGICAL PATHOLOGY

## 2023-09-18 ENCOUNTER — Encounter: Payer: Self-pay | Admitting: Gastroenterology

## 2023-09-23 ENCOUNTER — Other Ambulatory Visit: Payer: Self-pay | Admitting: *Deleted

## 2023-09-23 DIAGNOSIS — E039 Hypothyroidism, unspecified: Secondary | ICD-10-CM

## 2023-09-23 DIAGNOSIS — D649 Anemia, unspecified: Secondary | ICD-10-CM

## 2023-09-23 DIAGNOSIS — E559 Vitamin D deficiency, unspecified: Secondary | ICD-10-CM

## 2023-09-23 DIAGNOSIS — C20 Malignant neoplasm of rectum: Secondary | ICD-10-CM

## 2023-09-23 NOTE — Progress Notes (Signed)
Received fax from Dr. Maryelizabeth Rowan requesting the following labs be drawn at next lab (11/19): CMP Mg+ CBC/Diff Iron,TIBC,ferritin TSH B12/Folate serum panel

## 2023-09-24 ENCOUNTER — Inpatient Hospital Stay: Payer: Medicare HMO | Attending: Nurse Practitioner | Admitting: Dietician

## 2023-09-24 DIAGNOSIS — E0590 Thyrotoxicosis, unspecified without thyrotoxic crisis or storm: Secondary | ICD-10-CM | POA: Insufficient documentation

## 2023-09-24 DIAGNOSIS — Z08 Encounter for follow-up examination after completed treatment for malignant neoplasm: Secondary | ICD-10-CM | POA: Insufficient documentation

## 2023-09-24 DIAGNOSIS — Z923 Personal history of irradiation: Secondary | ICD-10-CM | POA: Insufficient documentation

## 2023-09-24 DIAGNOSIS — Z8541 Personal history of malignant neoplasm of cervix uteri: Secondary | ICD-10-CM | POA: Insufficient documentation

## 2023-09-24 DIAGNOSIS — Z85048 Personal history of other malignant neoplasm of rectum, rectosigmoid junction, and anus: Secondary | ICD-10-CM | POA: Insufficient documentation

## 2023-09-24 DIAGNOSIS — D649 Anemia, unspecified: Secondary | ICD-10-CM | POA: Insufficient documentation

## 2023-09-24 DIAGNOSIS — E559 Vitamin D deficiency, unspecified: Secondary | ICD-10-CM | POA: Insufficient documentation

## 2023-09-24 NOTE — Progress Notes (Signed)
Nutrition Follow Up: Reached out to patient at home telephone #.   Reason for Assessment: MST screen for weight loss with recent hospitalization for GI bleed.    ASSESSMENT: Patient is a 77 year old female who is followed by Dr. Truett Perna for history of locally advanced rectal/cervical adenocarcinoma. She has been maintained off of treatment for almost 2 years and there has been no clinical evidence of disease progression.   She was given 3 until PRBC in the hospital.  She reports feeling well since discharge and that swelling of her legs has gone down which could explain some of the weight loss  and fluctuations.  She has no trouble with appetite currently denies NIS has ostomy and out regular. She reports a 2 meal per day pattern of eating but also has sandwich in between meals for lunch. Recent intake: This morning:  Biscuit & tomato Last night: Pork chop with mashed potatoes and green beans Usual sandwich mid day. Fluids: "2 big bottles water /day. Coffee sometimes   Medications: Include Lasix, women's MVI daily  Anthropometrics: Down 11# (7%) in 5 days  Height: 60" Weight: 144.7# UBW: fluctuates with fluids BMI: 28.32  Estimated Nutritional Needs:    Kcal:  1400-1600   Protein:  60-75g   Fluid:  1.6L/day  NUTRITION DIAGNOSIS: (Per IP RD )  Increased nutrient needs related to acute illness as evidenced by estimated needs. Resolving  INTERVENTION:  Relayed that nutrition services are wrap around service provided at no charge and encouraged continued communication if experiencing continued weight loss or any nutritional impact symptoms (NIS).   Encouraged continued three feeds with nutrient dense high protein choices Offered to give contact information if her nutritional intake changed, she preferred I send to daughter's email.   MONITORING, EVALUATION, GOAL: weight, PO intake, Nutrition Impact Symptoms, labs Goal is weight maintenance  Next Visit: PRN at patient or  provider request  Gennaro Africa, RDN, LDN Registered Dietitian, [facility] Part Time Remote (Usual office hours: Tuesday-Thursday) Cell: [phone]

## 2023-09-29 ENCOUNTER — Encounter: Payer: Self-pay | Admitting: Podiatry

## 2023-09-29 ENCOUNTER — Ambulatory Visit: Payer: Medicare HMO | Admitting: Podiatry

## 2023-09-29 DIAGNOSIS — M79674 Pain in right toe(s): Secondary | ICD-10-CM

## 2023-09-29 DIAGNOSIS — B351 Tinea unguium: Secondary | ICD-10-CM | POA: Diagnosis not present

## 2023-09-29 DIAGNOSIS — M79675 Pain in left toe(s): Secondary | ICD-10-CM | POA: Diagnosis not present

## 2023-09-29 DIAGNOSIS — E1142 Type 2 diabetes mellitus with diabetic polyneuropathy: Secondary | ICD-10-CM | POA: Diagnosis not present

## 2023-09-29 DIAGNOSIS — L84 Corns and callosities: Secondary | ICD-10-CM | POA: Diagnosis not present

## 2023-09-29 DIAGNOSIS — I739 Peripheral vascular disease, unspecified: Secondary | ICD-10-CM

## 2023-10-04 ENCOUNTER — Encounter: Payer: Self-pay | Admitting: Podiatry

## 2023-10-04 NOTE — Progress Notes (Signed)
Subjective:  Patient ID: [name], female    DOB: August 10, 1946,  MRN: [id number]  [name] presents to clinic today for at risk foot care. Patient has h/o diabetes with history of ulceration of right foot. She is accompanied by her daughter, Selena Batten, on today's visit. Patient has been wearing socks around her house. Chief Complaint  Patient presents with   Diabetes    [facility]   PCP is Lewis Moccasin, MD.  Allergies  Allergen Reactions   Codeine Nausea Only    Review of Systems: Negative except as noted in the HPI.  Objective: No changes noted in today's physical examination. There were no vitals filed for this visit. [name] is a pleasant 77 y.o. female frail, in NAD. AAO x 3.  Vascular:  Capillary fill time to digits <3 seconds b/l lower extremities. Palpable DP pulse(s) b/l lower extremities Palpable PT pulse(s) b/l lower extremities Pedal hair absent. Lower extremity skin temperature gradient within normal limits. No pain with calf compression b/l. Trace edema noted right lower extremity with no open wounds.   Dermatological:  Skin warm and supple b/l lower extremities. No open wounds b/l lower extremities. No interdigital macerations b/l lower extremities.   Toenails 1-5 b/l elongated, discolored, dystrophic, thickened, crumbly with subungual debris and tenderness to dorsal palpation.   Preulcerative lesion(s) sub IPJ R hallux.  No erythema, no edema, no drainage, no fluctuance.   Preulcerative lesion sub 1 right foot with underlying fluctuance. No surrounding erythema, edema, or drainage. No tracking/tunneling into deep tissues.  She has minimal hyperkeratosis sub hallux IPJ of the left foot.   Musculoskeletal:  Normal muscle strength 5/5 to all lower extremity muscle groups bilaterally. No pain crepitus or joint limitation noted with ROM b/l lower extremities.   Hammertoe(s) noted to the L 2nd toe and R 2nd toe.   Neurological:  Protective  sensation diminished with 10g monofilament b/l. Vibratory sensation decreased b/l.  Assessment/Plan: 1. Pain due to onychomycosis of toenails of both feet   2. Pre-ulcerative calluses   3. PAD (peripheral artery disease) (HCC)   4. Diabetic peripheral neuropathy associated with type 2 diabetes mellitus (HCC)    -Patient's family member present. All questions/concerns addressed on today's visit. -Examined patient. -Applied Betadine Solution to lesion sub 1 right foot. Continue same once daily. Advised patient to wear house slippers or shoes with memory foam insoles in the home. She and family related understanding. -Toenails 1-5 b/l were debrided in length and girth with sterile nail nippers and dremel without iatrogenic bleeding.  -Callus(es) submet head 1 left foot pared utilizing sterile scalpel blade without complication or incident. Total number debrided =1. -Preulcerative lesion pared R hallux and submet head 1 right foot utilizing sterile scalpel blade. Total number pared=2. -Patient scheduled to see Dr. Sharl Ma in 7-10 days for follow up of right foot preulcerative lesion. -Patient/POA to call should there be question/concern in the interim.   Return in about 3 months (around 12/30/2023).  Freddie Breech, DPM       [facility] LOCATION: [address], [place] [address]                   Office (  [phone]   [facility] LOCATION: [address] [place], [place] [address] Office [phone]

## 2023-10-06 ENCOUNTER — Other Ambulatory Visit: Payer: Medicare HMO

## 2023-10-06 ENCOUNTER — Inpatient Hospital Stay: Payer: Medicare HMO

## 2023-10-06 ENCOUNTER — Inpatient Hospital Stay: Payer: Medicare HMO | Admitting: Oncology

## 2023-10-06 VITALS — BP 137/46 | HR 61 | Temp 98.2°F | Resp 18 | Ht 60.0 in | Wt 145.0 lb

## 2023-10-06 DIAGNOSIS — Z8541 Personal history of malignant neoplasm of cervix uteri: Secondary | ICD-10-CM | POA: Diagnosis not present

## 2023-10-06 DIAGNOSIS — E039 Hypothyroidism, unspecified: Secondary | ICD-10-CM

## 2023-10-06 DIAGNOSIS — Z08 Encounter for follow-up examination after completed treatment for malignant neoplasm: Secondary | ICD-10-CM | POA: Diagnosis present

## 2023-10-06 DIAGNOSIS — D649 Anemia, unspecified: Secondary | ICD-10-CM

## 2023-10-06 DIAGNOSIS — Z85048 Personal history of other malignant neoplasm of rectum, rectosigmoid junction, and anus: Secondary | ICD-10-CM | POA: Diagnosis not present

## 2023-10-06 DIAGNOSIS — C20 Malignant neoplasm of rectum: Secondary | ICD-10-CM

## 2023-10-06 DIAGNOSIS — E559 Vitamin D deficiency, unspecified: Secondary | ICD-10-CM

## 2023-10-06 DIAGNOSIS — E0590 Thyrotoxicosis, unspecified without thyrotoxic crisis or storm: Secondary | ICD-10-CM | POA: Diagnosis not present

## 2023-10-06 DIAGNOSIS — Z923 Personal history of irradiation: Secondary | ICD-10-CM | POA: Diagnosis not present

## 2023-10-06 LAB — CBC WITH DIFFERENTIAL (CANCER CENTER ONLY)
Abs Immature Granulocytes: 0.04 10*3/uL (ref 0.00–0.07)
Basophils Absolute: 0 10*3/uL (ref 0.0–0.1)
Basophils Relative: 0 %
Eosinophils Absolute: 0.1 10*3/uL (ref 0.0–0.5)
Eosinophils Relative: 1 %
HCT: 30.3 % — ABNORMAL LOW (ref 36.0–46.0)
Hemoglobin: 9.3 g/dL — ABNORMAL LOW (ref 12.0–15.0)
Immature Granulocytes: 1 %
Lymphocytes Relative: 12 %
Lymphs Abs: 0.8 10*3/uL (ref 0.7–4.0)
MCH: 27.8 pg (ref 26.0–34.0)
MCHC: 30.7 g/dL (ref 30.0–36.0)
MCV: 90.4 fL (ref 80.0–100.0)
Monocytes Absolute: 0.4 10*3/uL (ref 0.1–1.0)
Monocytes Relative: 6 %
Neutro Abs: 5.5 10*3/uL (ref 1.7–7.7)
Neutrophils Relative %: 80 %
Platelet Count: 185 10*3/uL (ref 150–400)
RBC: 3.35 MIL/uL — ABNORMAL LOW (ref 3.87–5.11)
RDW: 15.4 % (ref 11.5–15.5)
WBC Count: 6.9 10*3/uL (ref 4.0–10.5)
nRBC: 0 % (ref 0.0–0.2)

## 2023-10-06 LAB — IRON AND TIBC
Iron: 35 ug/dL (ref 28–170)
Saturation Ratios: 8 % — ABNORMAL LOW (ref 10.4–31.8)
TIBC: 424 ug/dL (ref 250–450)
UIBC: 389 ug/dL

## 2023-10-06 LAB — SAMPLE TO BLOOD BANK

## 2023-10-06 LAB — CMP (CANCER CENTER ONLY)
ALT: 7 U/L (ref 0–44)
AST: 14 U/L — ABNORMAL LOW (ref 15–41)
Albumin: 4.1 g/dL (ref 3.5–5.0)
Alkaline Phosphatase: 125 U/L (ref 38–126)
Anion gap: 9 (ref 5–15)
BUN: 16 mg/dL (ref 8–23)
CO2: 24 mmol/L (ref 22–32)
Calcium: 8.4 mg/dL — ABNORMAL LOW (ref 8.9–10.3)
Chloride: 109 mmol/L (ref 98–111)
Creatinine: 0.98 mg/dL (ref 0.44–1.00)
GFR, Estimated: 59 mL/min — ABNORMAL LOW (ref >60)
Glucose, Bld: 244 mg/dL — ABNORMAL HIGH (ref 70–99)
Potassium: 3.9 mmol/L (ref 3.5–5.1)
Sodium: 142 mmol/L (ref 135–145)
Total Bilirubin: 0.4 mg/dL (ref <1.2)
Total Protein: 6.2 g/dL — ABNORMAL LOW (ref 6.5–8.1)

## 2023-10-06 LAB — TSH: TSH: 1.387 u[IU]/mL (ref 0.350–4.500)

## 2023-10-06 LAB — MAGNESIUM: Magnesium: 1.3 mg/dL — ABNORMAL LOW (ref 1.7–2.4)

## 2023-10-06 LAB — VITAMIN D 25 HYDROXY (VIT D DEFICIENCY, FRACTURES): Vit D, 25-Hydroxy: 31.4 ng/mL (ref 30–100)

## 2023-10-06 LAB — FERRITIN: Ferritin: 12 ng/mL (ref 11–307)

## 2023-10-06 NOTE — Progress Notes (Signed)
[facility] OFFICE PROGRESS NOTE   Diagnosis: Rectal cancer, cervical cancer  INTERVAL HISTORY:   [name] returns as scheduled.  She was admitted on 09/10/2023 with severe symptomatic anemia.  She evidence of upper GI bleeding.  An upper endoscopy on 09/11/2023 revealed ulcers in the duodenum.  She was transfused with packed red blood cells and placed on Protonix.  She denies bleeding.  No complaint today.  Objective:  Vital signs in last 24 hours:  Blood pressure (!) 137/46, pulse 61, temperature 98.2 F (36.8 C), temperature source Temporal, resp. rate 18, height 5' (1.524 m), weight 145 lb (65.8 kg), SpO2 97%.    Lymphatics: No cervical, supraclavicular, or inguinal nodes Resp: Lungs clear bilaterally Cardio: Regular rate and rhythm GI: No hepatosplenomegaly, left lower quadrant colostomy with brown stool Vascular: 1+ right leg edema, trace left lower leg edema, chronic stasis change at the right greater than left lower leg   Portacath/PICC-without erythema  Lab Results:  Lab Results  Component Value Date   WBC 6.9 10/06/2023   HGB 9.3 (L) 10/06/2023   HCT 30.3 (L) 10/06/2023   MCV 90.4 10/06/2023   PLT 185 10/06/2023   NEUTROABS 5.5 10/06/2023    CMP  Lab Results  Component Value Date   NA 142 10/06/2023   K 3.9 10/06/2023   CL 109 10/06/2023   CO2 24 10/06/2023   GLUCOSE 244 (H) 10/06/2023   BUN 16 10/06/2023   CREATININE 0.98 10/06/2023   CALCIUM 8.4 (L) 10/06/2023   PROT 6.2 (L) 10/06/2023   ALBUMIN 4.1 10/06/2023   AST 14 (L) 10/06/2023   ALT 7 10/06/2023   ALKPHOS 125 10/06/2023   BILITOT 0.4 10/06/2023   GFRNONAA 59 (L) 10/06/2023   GFRAA >60 03/29/2020    Lab Results  Component Value Date   CEA1 28.60 (H) 06/12/2021   CEA 44.01 (H) 02/02/2023    Lab Results  Component Value Date   INR 1.3 (H) 11/29/2021   LABPROT 15.9 (H) 11/29/2021    Imaging:  No results found.  Medications: I have reviewed the patient's current  medications.   Assessment/Plan: Rectal cancer Partially obstructing mass beginning at 10 cm from the anal verge on colonoscopy 01/23/2021, biopsy-adenocarcinoma, immunohistochemical stains at [facility]-CK20, CDX-2, and SATB2 positive, patchy strong staining for p16.  The rectal tumor has a distinct immunohistochemical profile suggesting a primary colorectal tumor as opposed to metastatic cervical cancer. CT abdomen/pelvis 01/18/2021-bilateral hydronephrosis, endometrial fluid collection, prominent stool in the colon MRI pelvis 01/23/2021-tumor at 7.6 cm from the anal verge, abnormal signal bridge in the cervix, mesorectum, and anterior aspect of the rectum-similar to MRI from 2020, MRI stage could not be defined secondary to posttreatment changes in the pelvis 03/18/2021-total abdominal hysterectomy, cystoscopy with insertion of ureteral stents, exploratory laparotomy, aborted low anterior resection, descending loop colostomy--right and left fallopian tubes and ovaries negative for malignancy; excision portion of cervix positive for adenocarcinoma; uterus with invasive moderately differentiated adenocarcinoma of the cervix, HPV associated, invading through full-thickness of the cervix/lower uterine segment and into parametrial tissue, margins of resection received disrupted, cannot be evaluated; remaining uterus with extensive endocervical adenocarcinoma in situ involving lower uterine segment and replacing endometrium.  This carcinoma was felt to be a separate primary from the rectal tumor. PET scan at [facility] 04/11/2021-intense FDG activity in region of known rectal mass.  Hypermetabolic left external iliac and right inguinal lymph nodes.  Minimal FDG uptake and small right supraclavicular lymph node.  No uptake seen in  the cervix. 04/23/2021 FNA right inguinal lymph node-negative for malignancy Evaluated by Dr. Lattie Corns 05/21/2021, 05/28/2021-appears to have 2 synchronous malignancies (cervical adenocarcinoma and rectal  adenocarcinoma); further surgery which would entail pelvic exenteration not recommended by gynecologic oncology or colorectal surgery.  Full doses of radiotherapy not felt to be feasible.  Recommended 3 months of CAPOX and then repeat MRI of abdomen/pelvis, chest CT and colonoscopy. Cycle 1 Xeloda 06/13/2021 Cycle 1 FOLFOX 07/08/2021, oxaliplatin dose reduced secondary to pre-existing neuropathy Cycle 2 FOLFOX 07/29/2021, Udenyca Cycle 3 FOLFOX 08/12/2021, Udenyca Cycle 4 FOLFOX 09/02/2021, Udenyca Cycle 5 FOLFOX 09/17/2021, Udenyca CT[facility] at [facility] 10/01/2021-unchanged findings of rectal malignancy status posttreatment.  Marked thickening of the proximal duodenal wall.  Unchanged bilateral inguinal lymph nodes which are not enlarged but avid on prior PET.  Unchanged diffuse bladder wall thickening and perivesicular stranding which may represent radiation cystitis.  New mild right lower lobe bronchial wall thickening, mucus impaction and tree-in-bud pulmonary nodules. Cycle 6 FOLFOX 10/22/2021, Udenyca CT 01/21/2022-stable soft tissue thickening at the lower rectum, slight increase in adjacent stranding no evidence of metastatic disease in the chest, abdomen, or pelvis, interval diverting loop colostomy CT pelvis and lower extremities 04/22/2023-subcutaneous fat stranding in the lower legs bilaterally on the right greater than left, enlarged right inguinal lymph nodes, total left hip arthroplasty with stable acetabular bone erosions, ill-defined soft tissue density in the region of the distal rectum with surrounding fat stranding unchanged     Bilateral hydronephrosis-secondary to urinary retention? Hematometra found on pelvic MRI 01/23/2021, evaluated by gynecologic oncology Cervical cancer November 1994, stage Ib, treated with external beam radiation and brachytherapy at [facility] Diabetes Neuropathy Chronic pain secondary to #6 Recurrent urinary tract infections History of a CVA History of hyperthyroidism G3,  P3 COVID-19 infection January 2021 Admission 08/25/2021 with a urinary tract infection-culture negative Anemia secondary to chronic disease, chemotherapy, and rectal bleeding-1 unit packed red blood cells 08/26/2021 Hospital admission 09/23/2021-symptomatic anemia 16.  Admission 10/08/2021 with altered mental status, nausea/vomiting, and left foot ulcers Urine culture 10/08/2021 positive for Pseudomonas aeruginosa EGD 10/12/2021-gastric and duodenal ulcers-not bleeding 17.  Admission 11/29/2021 through 12/02/2021 with COVID-pneumonia 18.  Right lower extremity cellulitis 04/22/2023-Bactrim, repeat course of Bactrim 05/07/2023 19.  Admission 09/10/2023 with severe symptomatic anemia, dark stool-Hemoccult positive 3 units packed red blood cells Upper endoscopy 09/11/2023: Duodenal ulcers with strictures likely source of GI bleeding, no active bleeding.     Disposition: [name] has a history of colorectal cancer.  There is no clinical evidence for progression of the cancer.  She was recently mated with upper GI bleeding secondary to duodenal ulcers.  She is now maintained on pantoprazole.  She will call for symptoms of anemia.  [name] will resume oral magnesium therapy.  She will return for an office and lab visit in early January.  Thornton Papas, MD  10/06/2023  2:58 PM

## 2023-10-07 ENCOUNTER — Telehealth: Payer: Self-pay

## 2023-10-07 NOTE — Telephone Encounter (Signed)
-----   Message from Thornton Papas sent at 10/06/2023  4:22 PM EST ----- Please call patient the iron level is at the low end of the normal range, begin ferrous sulfate 325 mg twice daily, follow-up as scheduled

## 2023-10-07 NOTE — Telephone Encounter (Signed)
Patient gave verbal understanding and had no further questions or concerns  

## 2023-10-08 ENCOUNTER — Ambulatory Visit: Payer: Medicare HMO | Admitting: Podiatry

## 2023-10-09 ENCOUNTER — Telehealth: Payer: Self-pay

## 2023-10-09 NOTE — Telephone Encounter (Signed)
I route the patient lab result to Dow Adolph, Np

## 2023-10-12 ENCOUNTER — Observation Stay (HOSPITAL_COMMUNITY): Payer: Medicare HMO

## 2023-10-12 ENCOUNTER — Other Ambulatory Visit: Payer: Self-pay

## 2023-10-12 ENCOUNTER — Encounter (HOSPITAL_COMMUNITY): Payer: Self-pay

## 2023-10-12 ENCOUNTER — Observation Stay (HOSPITAL_BASED_OUTPATIENT_CLINIC_OR_DEPARTMENT_OTHER)
Admission: EM | Admit: 2023-10-12 | Discharge: 2023-10-14 | Disposition: A | Discharge status: Home / Self Care | Payer: Medicare HMO | Source: Home / Self Care | Attending: Emergency Medicine | Admitting: Emergency Medicine

## 2023-10-12 DIAGNOSIS — K222 Esophageal obstruction: Secondary | ICD-10-CM | POA: Insufficient documentation

## 2023-10-12 DIAGNOSIS — D649 Anemia, unspecified: Secondary | ICD-10-CM | POA: Insufficient documentation

## 2023-10-12 DIAGNOSIS — Z7902 Long term (current) use of antithrombotics/antiplatelets: Secondary | ICD-10-CM | POA: Insufficient documentation

## 2023-10-12 DIAGNOSIS — E119 Type 2 diabetes mellitus without complications: Secondary | ICD-10-CM | POA: Insufficient documentation

## 2023-10-12 DIAGNOSIS — E876 Hypokalemia: Secondary | ICD-10-CM | POA: Insufficient documentation

## 2023-10-12 DIAGNOSIS — Z8673 Personal history of transient ischemic attack (TIA), and cerebral infarction without residual deficits: Secondary | ICD-10-CM | POA: Insufficient documentation

## 2023-10-12 DIAGNOSIS — E039 Hypothyroidism, unspecified: Secondary | ICD-10-CM | POA: Insufficient documentation

## 2023-10-12 DIAGNOSIS — Z85038 Personal history of other malignant neoplasm of large intestine: Secondary | ICD-10-CM | POA: Insufficient documentation

## 2023-10-12 DIAGNOSIS — Z8541 Personal history of malignant neoplasm of cervix uteri: Secondary | ICD-10-CM | POA: Insufficient documentation

## 2023-10-12 DIAGNOSIS — K31819 Angiodysplasia of stomach and duodenum without bleeding: Secondary | ICD-10-CM | POA: Insufficient documentation

## 2023-10-12 DIAGNOSIS — Z87891 Personal history of nicotine dependence: Secondary | ICD-10-CM | POA: Insufficient documentation

## 2023-10-12 DIAGNOSIS — K269 Duodenal ulcer, unspecified as acute or chronic, without hemorrhage or perforation: Secondary | ICD-10-CM

## 2023-10-12 DIAGNOSIS — K920 Hematemesis: Secondary | ICD-10-CM

## 2023-10-12 DIAGNOSIS — E1169 Type 2 diabetes mellitus with other specified complication: Principal | ICD-10-CM

## 2023-10-12 DIAGNOSIS — Z7984 Long term (current) use of oral hypoglycemic drugs: Secondary | ICD-10-CM | POA: Insufficient documentation

## 2023-10-12 DIAGNOSIS — N390 Urinary tract infection, site not specified: Secondary | ICD-10-CM | POA: Diagnosis not present

## 2023-10-12 DIAGNOSIS — K922 Gastrointestinal hemorrhage, unspecified: Secondary | ICD-10-CM | POA: Insufficient documentation

## 2023-10-12 DIAGNOSIS — K315 Obstruction of duodenum: Secondary | ICD-10-CM | POA: Insufficient documentation

## 2023-10-12 DIAGNOSIS — K2970 Gastritis, unspecified, without bleeding: Secondary | ICD-10-CM | POA: Insufficient documentation

## 2023-10-12 DIAGNOSIS — R1010 Upper abdominal pain, unspecified: Secondary | ICD-10-CM

## 2023-10-12 DIAGNOSIS — Z794 Long term (current) use of insulin: Secondary | ICD-10-CM | POA: Insufficient documentation

## 2023-10-12 LAB — COMPREHENSIVE METABOLIC PANEL
ALT: 12 U/L (ref 0–44)
AST: 19 U/L (ref 15–41)
Albumin: 3.3 g/dL — ABNORMAL LOW (ref 3.5–5.0)
Alkaline Phosphatase: 121 U/L (ref 38–126)
Anion gap: 10 (ref 5–15)
BUN: 22 mg/dL (ref 8–23)
CO2: 24 mmol/L (ref 22–32)
Calcium: 8.8 mg/dL — ABNORMAL LOW (ref 8.9–10.3)
Chloride: 103 mmol/L (ref 98–111)
Creatinine, Ser: 1.05 mg/dL — ABNORMAL HIGH (ref 0.44–1.00)
GFR, Estimated: 55 mL/min — ABNORMAL LOW (ref >60)
Glucose, Bld: 138 mg/dL — ABNORMAL HIGH (ref 70–99)
Potassium: 4.1 mmol/L (ref 3.5–5.1)
Sodium: 137 mmol/L (ref 135–145)
Total Bilirubin: 1 mg/dL (ref <1.2)
Total Protein: 6.4 g/dL — ABNORMAL LOW (ref 6.5–8.1)

## 2023-10-12 LAB — GLUCOSE, CAPILLARY
Glucose-Capillary: 117 mg/dL — ABNORMAL HIGH (ref 70–99)
Glucose-Capillary: 77 mg/dL (ref 70–99)
Glucose-Capillary: 83 mg/dL (ref 70–99)

## 2023-10-12 LAB — CBC
HCT: 34.1 % — ABNORMAL LOW (ref 36.0–46.0)
Hemoglobin: 10.3 g/dL — ABNORMAL LOW (ref 12.0–15.0)
MCH: 27.8 pg (ref 26.0–34.0)
MCHC: 30.2 g/dL (ref 30.0–36.0)
MCV: 92.2 fL (ref 80.0–100.0)
Platelets: 222 10*3/uL (ref 150–400)
RBC: 3.7 MIL/uL — ABNORMAL LOW (ref 3.87–5.11)
RDW: 15.8 % — ABNORMAL HIGH (ref 11.5–15.5)
WBC: 9.7 10*3/uL (ref 4.0–10.5)
nRBC: 0 % (ref 0.0–0.2)

## 2023-10-12 LAB — LIPASE, BLOOD: Lipase: 27 U/L (ref 11–51)

## 2023-10-12 LAB — TYPE AND SCREEN
ABO/RH(D): O POS
Antibody Screen: NEGATIVE

## 2023-10-12 MED ORDER — ACETAMINOPHEN 325 MG PO TABS
650.0000 mg | ORAL_TABLET | Freq: Four times a day (QID) | ORAL | Status: DC | PRN
  Administered 2023-10-13: 650 mg via ORAL
  Filled 2023-10-12: qty 2

## 2023-10-12 MED ORDER — INSULIN ASPART 100 UNIT/ML IJ SOLN
0.0000 [IU] | Freq: Three times a day (TID) | INTRAMUSCULAR | Status: DC
  Administered 2023-10-14: 3 [IU] via SUBCUTANEOUS
  Administered 2023-10-14: 2 [IU] via SUBCUTANEOUS

## 2023-10-12 MED ORDER — SODIUM CHLORIDE 0.9 % IV SOLN: INTRAVENOUS | Status: AC

## 2023-10-12 MED ORDER — GABAPENTIN 300 MG PO CAPS: 300.0000 mg | ORAL_CAPSULE | ORAL | Status: DC

## 2023-10-12 MED ORDER — OXYCODONE HCL ER 10 MG PO T12A
10.0000 mg | EXTENDED_RELEASE_TABLET | Freq: Two times a day (BID) | ORAL | Status: DC
  Administered 2023-10-13 – 2023-10-14 (×3): 10 mg via ORAL
  Filled 2023-10-12 (×4): qty 1

## 2023-10-12 MED ORDER — ALBUTEROL SULFATE (2.5 MG/3ML) 0.083% IN NEBU: 2.5000 mg | INHALATION_SOLUTION | RESPIRATORY_TRACT | Status: DC | PRN

## 2023-10-12 MED ORDER — INSULIN ASPART 100 UNIT/ML IJ SOLN: 0.0000 [IU] | Freq: Every day | INTRAMUSCULAR | Status: DC

## 2023-10-12 MED ORDER — IOHEXOL 300 MG/ML  SOLN
100.0000 mL | Freq: Once | INTRAMUSCULAR | Status: AC | PRN
  Administered 2023-10-12: 100 mL via INTRAVENOUS

## 2023-10-12 MED ORDER — PANTOPRAZOLE SODIUM 40 MG IV SOLR
40.0000 mg | Freq: Two times a day (BID) | INTRAVENOUS | Status: DC
  Administered 2023-10-12 – 2023-10-13 (×3): 40 mg via INTRAVENOUS
  Filled 2023-10-12 (×3): qty 10

## 2023-10-12 MED ORDER — PANTOPRAZOLE SODIUM 40 MG IV SOLR
40.0000 mg | Freq: Once | INTRAVENOUS | Status: AC
  Administered 2023-10-12: 40 mg via INTRAVENOUS
  Filled 2023-10-12: qty 10

## 2023-10-12 MED ORDER — MELATONIN 5 MG PO TABS
5.0000 mg | ORAL_TABLET | Freq: Every day | ORAL | Status: DC
  Administered 2023-10-13: 5 mg via ORAL
  Filled 2023-10-12: qty 1

## 2023-10-12 MED ORDER — GABAPENTIN 300 MG PO CAPS
300.0000 mg | ORAL_CAPSULE | Freq: Every day | ORAL | Status: DC
  Administered 2023-10-13 – 2023-10-14 (×2): 300 mg via ORAL
  Filled 2023-10-12 (×3): qty 1

## 2023-10-12 MED ORDER — ATORVASTATIN CALCIUM 40 MG PO TABS
40.0000 mg | ORAL_TABLET | Freq: Every morning | ORAL | Status: DC
  Administered 2023-10-13 – 2023-10-14 (×2): 40 mg via ORAL
  Filled 2023-10-12 (×3): qty 1

## 2023-10-12 MED ORDER — ACETAMINOPHEN 650 MG RE SUPP: 650.0000 mg | Freq: Four times a day (QID) | RECTAL | Status: DC | PRN

## 2023-10-12 MED ORDER — BACLOFEN 10 MG PO TABS: 10.0000 mg | ORAL_TABLET | Freq: Four times a day (QID) | ORAL | Status: DC | PRN

## 2023-10-12 MED ORDER — GABAPENTIN 300 MG PO CAPS
600.0000 mg | ORAL_CAPSULE | Freq: Every day | ORAL | Status: DC
  Administered 2023-10-12 – 2023-10-13 (×2): 600 mg via ORAL
  Filled 2023-10-12 (×2): qty 2

## 2023-10-12 MED ORDER — LEVOTHYROXINE SODIUM 25 MCG PO TABS
137.0000 ug | ORAL_TABLET | Freq: Every day | ORAL | Status: DC
  Administered 2023-10-14: 137 ug via ORAL
  Filled 2023-10-12: qty 1

## 2023-10-12 MED ORDER — SERTRALINE HCL 50 MG PO TABS
50.0000 mg | ORAL_TABLET | Freq: Every day | ORAL | Status: DC
  Administered 2023-10-12 – 2023-10-14 (×3): 50 mg via ORAL
  Filled 2023-10-12 (×4): qty 1

## 2023-10-12 MED ORDER — PANTOPRAZOLE SODIUM 40 MG PO TBEC: 40.0000 mg | DELAYED_RELEASE_TABLET | Freq: Two times a day (BID) | ORAL | Status: DC

## 2023-10-12 NOTE — ED Triage Notes (Addendum)
C/o upper abd pain with n/v described as dark brown coffee ground emesis x1 day Patient has ostomy with normal output per patient.  Hx of GI bleed and colorectal cancer.

## 2023-10-12 NOTE — ED Provider Notes (Signed)
[place] EMERGENCY DEPARTMENT AT [facility] Provider Note   CSN: [id number] Arrival date & time: 10/12/23  1119     History  Chief Complaint  Patient presents with   Emesis    [name] is a 77 y.o. female.  77 year old female with history of duodenal ulcer presents with coffee-ground emesis times several days.  Epigastric pain but denies any fever.  No urinary symptoms.  Patient is on Plavix due to history of CVA.  Had hospitalization last month for similar symptoms.  Has noted some blood in her ostomy bag as well to.  Notes increasing weakness.  Patient's symptoms are similar to her prior upper GI bleed.  No treatment use prior to arrival       Home Medications Prior to Admission medications   Medication Sig Start Date End Date Taking? Authorizing Provider  ascorbic acid (VITAMIN C) 500 MG tablet Take 1 tablet (500 mg total) by mouth daily. 12/03/21   Rolly Salter, MD  atorvastatin (LIPITOR) 40 MG tablet Take 40 mg by mouth every morning. 12/15/19   [provider]  baclofen (LIORESAL) 10 MG tablet Take 10 mg by mouth 4 (four) times daily as needed for muscle spasms. 03/22/21   [provider]  clopidogrel (PLAVIX) 75 MG tablet Take 1 tablet (75 mg total) by mouth at bedtime. 10/12/21   Willeen Niece, MD  docusate sodium (COLACE) 100 MG capsule Take 100 mg by mouth 2 (two) times daily.    [provider]  feeding supplement, GLUCERNA SHAKE, (GLUCERNA SHAKE) LIQD Take 237 mLs by mouth 3 (three) times daily between meals. Patient not taking: Reported on 10/06/2023 12/02/21   Rolly Salter, MD  furosemide (LASIX) 20 MG tablet Take 20 mg by mouth daily. 11/19/21   [provider]  gabapentin (NEURONTIN) 300 MG capsule Take 300-600 mg by mouth See admin instructions. Take one capsule (300 mg) by mouth every morning and two capsules (600 mg) at night    [provider]  gemfibrozil (LOPID) 600 MG tablet Take 600 mg by  mouth 2 (two) times daily.    [provider]  insulin degludec (TRESIBA FLEXTOUCH) 100 UNIT/ML FlexTouch Pen Inject 10 Units into the skin daily. 08/28/21   Osvaldo Shipper, MD  levothyroxine (SYNTHROID) 137 MCG tablet Take 137 mcg by mouth daily before breakfast.    [provider]  MAGnesium-Oxide 400 (240 Mg) MG tablet Take 1 tablet by mouth twice daily 08/10/23   Ladene Artist, MD  melatonin 5 MG TABS Take 1 tablet (5 mg total) by mouth at bedtime. 12/02/21   Rolly Salter, MD  metFORMIN (GLUCOPHAGE) 500 MG tablet Take 500 mg by mouth 2 (two) times daily. 02/01/21   [provider]  Multiple Vitamin (MULTIVITAMIN WITH MINERALS) TABS tablet Take 1 tablet by mouth daily. 12/03/21   Rolly Salter, MD  nystatin (MYCOSTATIN/NYSTOP) powder Apply 1 Application topically 2 (two) times daily. To groin rash 07/24/23   Ladene Artist, MD  ondansetron (ZOFRAN) 8 MG tablet Take 1 tablet (8 mg total) by mouth every 6 (six) hours as needed for nausea or vomiting. Patient not taking: Reported on 10/06/2023 10/31/21   Esterwood, Amy S, PA-C  oxyCODONE ER [facility] ER) 9 MG C12A Take 9 mg by mouth in the morning and at bedtime.    [provider]  pantoprazole (PROTONIX) 40 MG tablet Take 1 tablet (40 mg total) by mouth 2 (two) times daily. 09/13/23  Lewie Chamber, MD  polyethylene glycol powder (GLYCOLAX/MIRALAX) 17 GM/SCOOP powder Take 17 g by mouth daily.    [provider]  sertraline (ZOLOFT) 50 MG tablet Take 50 mg by mouth daily.    [provider]  triamcinolone cream (KENALOG) 0.1 % Apply 1 application  topically 2 (two) times daily as needed (groin rash). Mix with nystatin cream 09/19/20   [provider]      Allergies    Codeine    Review of Systems   Review of Systems  All other systems reviewed and are negative.   Physical Exam Updated Vital Signs BP 115/60 (BP Location: Right Arm)   Pulse 68   Temp 98.7 F (37.1 C)  (Oral)   Resp 18   Wt 65 kg   SpO2 94%   BMI 27.99 kg/m  Physical Exam Vitals and nursing note reviewed.  Constitutional:      General: She is not in acute distress.    Appearance: Normal appearance. She is well-developed. She is not toxic-appearing.  HENT:     Head: Normocephalic and atraumatic.  Eyes:     General: Lids are normal.     Conjunctiva/sclera: Conjunctivae normal.     Pupils: Pupils are equal, round, and reactive to light.  Neck:     Thyroid: No thyroid mass.     Trachea: No tracheal deviation.  Cardiovascular:     Rate and Rhythm: Normal rate and regular rhythm.     Heart sounds: Normal heart sounds. No murmur heard.    No gallop.  Pulmonary:     Effort: Pulmonary effort is normal. No respiratory distress.     Breath sounds: Normal breath sounds. No stridor. No decreased breath sounds, wheezing, rhonchi or rales.  Abdominal:     General: There is no distension.     Palpations: Abdomen is soft.     Tenderness: There is no abdominal tenderness. There is no rebound.    Musculoskeletal:        General: No tenderness. Normal range of motion.     Cervical back: Normal range of motion and neck supple.  Skin:    General: Skin is warm and dry.     Findings: No abrasion or rash.  Neurological:     Mental Status: She is alert and oriented to person, place, and time. Mental status is at baseline.     GCS: GCS eye subscore is 4. GCS verbal subscore is 5. GCS motor subscore is 6.     Cranial Nerves: Cranial nerves are intact. No cranial nerve deficit.     Sensory: No sensory deficit.     Motor: Motor function is intact.  Psychiatric:        Attention and Perception: Attention normal.        Speech: Speech normal.        Behavior: Behavior normal.     ED Results / Procedures / Treatments   Labs (all labs ordered are listed, but only abnormal results are displayed) Labs Reviewed  COMPREHENSIVE METABOLIC PANEL  CBC  POC OCCULT BLOOD, ED  TYPE AND SCREEN     EKG EKG Interpretation Date/Time:  Monday October 12 2023 11:51:32 EST Ventricular Rate:  70 PR Interval:  105 QRS Duration:  102 QT Interval:  439 QTC Calculation: 474 R Axis:   53  Text Interpretation: Sinus rhythm Short PR interval Confirmed by Lorre Nick ([id number]) on 10/12/2023 1:22:18 PM  Radiology No results found.  Procedures Procedures  Medications Ordered in ED Medications  pantoprazole (PROTONIX) injection 40 mg (has no administration in time range)    ED Course/ Medical Decision Making/ A&P                                 Medical Decision Making Amount and/or Complexity of Data Reviewed Labs: ordered. ECG/medicine tests: ordered.  Risk Prescription drug management.   Patient is EKG per interpretation shows normal sinus rhythm.  Patient has history of duodenal ulcer disease.  Started on Protonix for suspected upper GI bleed.  Patient's hemoglobin is stable here.  Discussed with GI and secure chat sent and they will see in follow-up.  Will admit to the hospitalist team        Final Clinical Impression(s) / ED Diagnoses Final diagnoses:  None    Rx / DC Orders ED Discharge Orders     None         Lorre Nick, MD 10/12/23 1332

## 2023-10-12 NOTE — H&P (View-Only) (Signed)
Consultation  Referring Provider:     Dr. Kirby Crigler Primary Care Physician:  Lewis Moccasin, MD Primary Gastroenterologist: Previously Dr. Orvan Falconer (Dr. Chales Abrahams on recent admission)       Reason for Consultation:    GI bleed         HPI:   [name] is a 77 y.o. female with a past medical history significant for rectal adenocarcinoma (status post colectomy treated with chemo at [facility]) and recurrent cervical adenocarcinoma (5/22), hypertension, CVA on Plavix, diabetes, hypothyroidism, chronic wounds, neuropathy and chronic anemia, who presented to the hospital today with hematemesis.    09/11/2023 consulted by our service for an upper GI bleed for GI bleed.  At that time suspected lower GI bleed with acute on chronic anemia in the setting of Plavix and aspirin with a history of CVA.  EGD at the time of duodenal ulcers with strictures which was thought the likely etiology of upper GI bleed with no active bleeding and erosive gastritis.  Patient treated with IV Protonix.  Recommended repeating EGD in 12 weeks.    Patient is found with her daughter by her bedside.  She explains that patient started vomiting again on Sunday morning 10/11/2023, apparently had a vomiting episode that morning and then again in the evening and then again this morning.  Was noticed to be brown in color with coffee grounds.  Also witnessed by the EMS who was called.  Patient was growing increasingly weak.  Per daughter she emptied her colostomy bag around 7/7:30 last night and it was light brown in color.  It is the same color now.  Only a small amount of stool.  She has been using Pantoprazole as prescribed except for this morning and daughter had held her Aspirin.  Still on Plavix, last dose yesterday.    Denies fever, chills or weight loss.  Previous GI workup: 09/11/2023 EGD with Dr. Chales Abrahams with 2 white open distal esophageal rings noted in the third portion of the esophagus, just above the GE junction, a  few localized 2 to 4 mm erosions with no bleeding and no stigmata of bleeding in the gastric antrum, 112 mm nonbleeding deep cratered duodenal ulcer with a clean ulcer base in the D1/D2 junction, erythematous margins and duodenal narrowing/stricture, linear ulcer noted just beyond this ulcer and proximal to the second portion of the duodenum, another third nonbleeding linear duodenal ulcer with a clean ulcer base in the second portion of the duodenum with stricture  01/08/2022 EGD with Dr. Orvan Falconer for gastric ulcer and persistent symptoms.normal esophagus resolution of gastric ulcers and duodenal ulcer. Erythematous mucosa in the gastric body and atrium.  Biopsied.  Mucosal changes in the duodenum.  Biopsied   Diagnosis 1. Surgical [P], duodenal bulb, 2nd portion of duodenum, and distal duodenum MILD ACUTE DUODENITIS WITH GASTRIC METAPLASIA CONSISTENT WITH PEPTIC DUODENITIS 2. Surgical [P], fundus, gastric antrum, and gastric body REACTIVE GASTROPATHY WITH FOVEOLAR HYPERPLASIA AND FOCAL SURFACE EROSION MILD CHRONIC GASTRITIS NEGATIVE FOR H. PYLORI, INTESTINAL METAPLASIA, DYSPLASIA AND CARCINOMA   10/12/21 EGD with Dr. Orvan Falconer for Nausea, vomiting, progressive anemia and abnormal duodenal wall thickening seen on CT in the setting of locally advanced inoperable rectal adenocarcinoma with LN mets   Impression: - Symptomatic gastric ulcer and duodenal ulcer disease. Biopsies pending. - Possible esophageal plaques were found, suspicious for candidiasis. Biopsied. - LA Grade A reflux esophagitis with no bleeding. - Mucosal changes in the duodenum of unclear clinical significance. Biopsied.   FINAL  MICROSCOPIC DIAGNOSIS:   A. DUODENAL BULB, BIOPSY:  -  Peptic duodenitis  -  No dysplasia or malignancy identified   B. GASTRIC, BIOPSY:  -  Chronic active gastritis  -  No intestinal metaplasia identified  -  See comment   C. ESOPHAGEAL, BIOPSY:  -  Acute fungal esophagitis  -  See comment     01/23/21 colonoscopy with Dr Lavon Paganini for Evaluation of unexplained GI bleeding presenting with Hematochezia, Evaluation of unexplained GI bleeding presenting with fecal occult blood, Lower abdominal pain, Unexplained iron deficiency anemia; Impression: - Stricture in the recto- sigmoid colon. Biopsied. - Rule out malignancy, partially obstructing tumor in the recto- sigmoid colon. Biopsied. Tattooed.   FINAL MICROSCOPIC DIAGNOSIS:   A. RECTUM, BIOPSY:  -  Adenocarcinoma arising in a background of high-grade dysplasia  -  See comment     Past Medical History:  Diagnosis Date   Arthritis    Basal ganglia stroke [facility])    March 2022   Bilateral lower extremity edema    Cancer (HCC)    twice- colon cancer, unsure of the other type of cancer   Carotid artery occlusion    Flank pain    Full dentures    History of cervical cancer    11/ 1994  Stage IB  s/p  high dose radiation brachytherapy @ [facility] 01/ 1995--- per pt no recurrence   History of chronic bronchitis    History of hyperthyroidism    d 03/ 2011---due to grave's disease--- s/p  RAI treatment 04/ 2011   History of kidney stones    History of sepsis 05/03/2018   due to UTI with klebsiella/ pyelonephritis/ ureteral obstruction cause by stone   Hyperlipidemia    Hypothyroidism, postradioiodine therapy    endocrinologist--  dr Everardo All--  dx graves disease and s/p RAI i131 treatement 4/ 2011   Nauseated    Oral thrush 05/03/2018   Pneumonia    Type 2 diabetes mellitus treated with insulin (HCC)    FOLLOWED BY PCP   Urgency of urination    Wears glasses     Past Surgical History:  Procedure Laterality Date   APPENDECTOMY     BIOPSY  01/23/2021   Procedure: BIOPSY;  Surgeon: Napoleon Form, MD;  Location: [facility];  Service: Endoscopy;;   BIOPSY  10/12/2021   Procedure: BIOPSY;  Surgeon: Tressia Danas, MD;  Location: [facility];  Service: Gastroenterology;;   BIOPSY  09/11/2023   Procedure: BIOPSY;  Surgeon:  Lynann Bologna, MD;  Location: [facility];  Service: Gastroenterology;;   CAROTID ENDARTERECTOMY Right 03/28/2020   CATARACT EXTRACTION W/ INTRAOCULAR LENS  IMPLANT, BILATERAL  2004  approx.   COLONOSCOPY     COLONOSCOPY WITH PROPOFOL N/A 01/23/2021   Procedure: COLONOSCOPY WITH PROPOFOL;  Surgeon: Napoleon Form, MD;  Location: [facility];  Service: Endoscopy;  Laterality: N/A;   CYSTOSCOPY WITH STENT PLACEMENT Right 05/04/2018   Procedure: CYSTOSCOPY WITH STENT PLACEMENT AND RETROGRADE PYELOGRAM;  Surgeon: Rene Paci, MD;  Location: [facility];  Service: Urology;  Laterality: Right;   CYSTOSCOPY/URETEROSCOPY/HOLMIUM LASER/STENT PLACEMENT Right 05/19/2018   Procedure: CYSTOSCOPY, URETEROSCOPY/HOLMIUM LASER, STONE BASKETRY/ STENT EXCHANGE;  Surgeon: Rene Paci, MD;  Location: [facility];  Service: Urology;  Laterality: Right;   ENDARTERECTOMY Right 03/28/2020   Procedure: RIGHT CAROTID ENDARTERECTOMY with PATCH ANGIOPLASTY;  Surgeon: Larina Earthly, MD;  Location: [facility];  Service: Vascular;  Laterality: Right;   ESOPHAGOGASTRODUODENOSCOPY (EGD) WITH PROPOFOL N/A 10/12/2021  Procedure: ESOPHAGOGASTRODUODENOSCOPY (EGD) WITH PROPOFOL;  Surgeon: Tressia Danas, MD;  [facility]: [facility];  Service: Gastroenterology;  Laterality: N/A;   ESOPHAGOGASTRODUODENOSCOPY (EGD) WITH PROPOFOL N/A 09/11/2023   Procedure: ESOPHAGOGASTRODUODENOSCOPY (EGD) WITH PROPOFOL;  Surgeon: Lynann Bologna, MD;  Location: [facility];  Service: Gastroenterology;  Laterality: N/A;   EXCISION MASS LEFT CHEST WALL  09-20-2007   dr Janee Morn  [facility]   Radioactive Iodine Therapy     for thyroid   REVISION TOTAL HIP ARTHROPLASTY Left early 2000s   SUBMUCOSAL TATTOO INJECTION  01/23/2021   Procedure: SUBMUCOSAL TATTOO INJECTION;  Surgeon: Napoleon Form, MD;  Location: [facility];  Service: Endoscopy;;   TANDEM RING INSERTION  1995   dr Loree Fee @ [facility]   EUA w/ tandem  placement in ovid  (for direct high dose radiation brachytherapy , cervical cancer)   TOTAL HIP ARTHROPLASTY Left 1990s   TRANSTHORACIC ECHOCARDIOGRAM  08/07/2017   mild focal basal hypertrophy of the septum,  ef 60-65%, grade 1 diastolic dysfunction/  atrial septum with lipomatous hypertrophy/  trivial PR   UPPER GASTROINTESTINAL ENDOSCOPY      Family History  Problem Relation Age of Onset   Emphysema Father    Diabetes Father    Emphysema Sister    Emphysema Brother    Cancer Mother        Skin Cancer   Diabetes Mother    Colon cancer Neg Hx    Esophageal cancer Neg Hx    Rectal cancer Neg Hx    Stomach cancer Neg Hx     Social History   Tobacco Use   Smoking status: Former    Current packs/day: 0.00    Types: Cigarettes    Start date: 05/13/1977    Quit date: 05/13/1982    Years since quitting: 41.4    Passive exposure: Past   Smokeless tobacco: Never  Vaping Use   Vaping status: Never Used  Substance Use Topics   Alcohol use: No   Drug use: No    Prior to Admission medications   Medication Sig Start Date End Date Taking? Authorizing Provider  ascorbic acid (VITAMIN C) 500 MG tablet Take 1 tablet (500 mg total) by mouth daily. 12/03/21   Rolly Salter, MD  atorvastatin (LIPITOR) 40 MG tablet Take 40 mg by mouth every morning. 12/15/19   [provider]  baclofen (LIORESAL) 10 MG tablet Take 10 mg by mouth 4 (four) times daily as needed for muscle spasms. 03/22/21   [provider]  clopidogrel (PLAVIX) 75 MG tablet Take 1 tablet (75 mg total) by mouth at bedtime. 10/12/21   Willeen Niece, MD  docusate sodium (COLACE) 100 MG capsule Take 100 mg by mouth 2 (two) times daily.    [provider]  feeding supplement, GLUCERNA SHAKE, (GLUCERNA SHAKE) LIQD Take 237 mLs by mouth 3 (three) times daily between meals. Patient not taking: Reported on 10/06/2023 12/02/21   Rolly Salter, MD  furosemide (LASIX) 20 MG tablet Take 20 mg by mouth daily.  11/19/21   [provider]  gabapentin (NEURONTIN) 300 MG capsule Take 300-600 mg by mouth See admin instructions. Take one capsule (300 mg) by mouth every morning and two capsules (600 mg) at night    [provider]  gemfibrozil (LOPID) 600 MG tablet Take 600 mg by mouth 2 (two) times daily.    [provider]  insulin degludec (TRESIBA FLEXTOUCH) 100 UNIT/ML FlexTouch Pen Inject 10 Units into the skin daily. 08/28/21  Osvaldo Shipper, MD  levothyroxine (SYNTHROID) 137 MCG tablet Take 137 mcg by mouth daily before breakfast.    [provider]  MAGnesium-Oxide 400 (240 Mg) MG tablet Take 1 tablet by mouth twice daily 08/10/23   Ladene Artist, MD  melatonin 5 MG TABS Take 1 tablet (5 mg total) by mouth at bedtime. 12/02/21   Rolly Salter, MD  metFORMIN (GLUCOPHAGE) 500 MG tablet Take 500 mg by mouth 2 (two) times daily. 02/01/21   [provider]  Multiple Vitamin (MULTIVITAMIN WITH MINERALS) TABS tablet Take 1 tablet by mouth daily. 12/03/21   Rolly Salter, MD  nystatin (MYCOSTATIN/NYSTOP) powder Apply 1 Application topically 2 (two) times daily. To groin rash 07/24/23   Ladene Artist, MD  ondansetron (ZOFRAN) 8 MG tablet Take 1 tablet (8 mg total) by mouth every 6 (six) hours as needed for nausea or vomiting. Patient not taking: Reported on 10/06/2023 10/31/21   Esterwood, Amy S, PA-C  oxyCODONE ER [facility] ER) 9 MG C12A Take 9 mg by mouth in the morning and at bedtime.    [provider]  pantoprazole (PROTONIX) 40 MG tablet Take 1 tablet (40 mg total) by mouth 2 (two) times daily. 09/13/23   Lewie Chamber, MD  polyethylene glycol powder (GLYCOLAX/MIRALAX) 17 GM/SCOOP powder Take 17 g by mouth daily.    [provider]  sertraline (ZOLOFT) 50 MG tablet Take 50 mg by mouth daily.    [provider]  triamcinolone cream (KENALOG) 0.1 % Apply 1 application  topically 2 (two) times daily as needed (groin rash). Mix with  nystatin cream 09/19/20   [provider]    No current facility-administered medications for this encounter.   Current Outpatient Medications  Medication Sig Dispense Refill   ascorbic acid (VITAMIN C) 500 MG tablet Take 1 tablet (500 mg total) by mouth daily. 30 tablet 0   atorvastatin (LIPITOR) 40 MG tablet Take 40 mg by mouth every morning.     baclofen (LIORESAL) 10 MG tablet Take 10 mg by mouth 4 (four) times daily as needed for muscle spasms.     clopidogrel (PLAVIX) 75 MG tablet Take 1 tablet (75 mg total) by mouth at bedtime. 30 tablet 1   docusate sodium (COLACE) 100 MG capsule Take 100 mg by mouth 2 (two) times daily.     feeding supplement, GLUCERNA SHAKE, (GLUCERNA SHAKE) LIQD Take 237 mLs by mouth 3 (three) times daily between meals. (Patient not taking: Reported on 10/06/2023) 10000 mL 0   furosemide (LASIX) 20 MG tablet Take 20 mg by mouth daily.     gabapentin (NEURONTIN) 300 MG capsule Take 300-600 mg by mouth See admin instructions. Take one capsule (300 mg) by mouth every morning and two capsules (600 mg) at night     gemfibrozil (LOPID) 600 MG tablet Take 600 mg by mouth 2 (two) times daily.     insulin degludec (TRESIBA FLEXTOUCH) 100 UNIT/ML FlexTouch Pen Inject 10 Units into the skin daily.     levothyroxine (SYNTHROID) 137 MCG tablet Take 137 mcg by mouth daily before breakfast.     MAGnesium-Oxide 400 (240 Mg) MG tablet Take 1 tablet by mouth twice daily 60 tablet 2   melatonin 5 MG TABS Take 1 tablet (5 mg total) by mouth at bedtime. 30 tablet 0   metFORMIN (GLUCOPHAGE) 500 MG tablet Take 500 mg by mouth 2 (two) times daily.     Multiple Vitamin (MULTIVITAMIN WITH MINERALS) TABS tablet Take 1  tablet by mouth daily. 30 tablet 0   nystatin (MYCOSTATIN/NYSTOP) powder Apply 1 Application topically 2 (two) times daily. To groin rash 15 g 1   ondansetron (ZOFRAN) 8 MG tablet Take 1 tablet (8 mg total) by mouth every 6 (six) hours as needed for nausea or vomiting.  (Patient not taking: Reported on 10/06/2023) 40 tablet 1   oxyCODONE ER (XTAMPZA ER) 9 MG C12A Take 9 mg by mouth in the morning and at bedtime.     pantoprazole (PROTONIX) 40 MG tablet Take 1 tablet (40 mg total) by mouth 2 (two) times daily. 60 tablet 3   polyethylene glycol powder (GLYCOLAX/MIRALAX) 17 GM/SCOOP powder Take 17 g by mouth daily.     sertraline (ZOLOFT) 50 MG tablet Take 50 mg by mouth daily.     triamcinolone cream (KENALOG) 0.1 % Apply 1 application  topically 2 (two) times daily as needed (groin rash). Mix with nystatin cream      Allergies as of 10/12/2023 - Review Complete 10/12/2023  Allergen Reaction Noted   Codeine Nausea Only 10/19/2008     Review of Systems:    Constitutional: No weight loss, fever or chills Skin: No rash  Cardiovascular: No chest pain   Respiratory: No SOB  Gastrointestinal: See HPI and otherwise negative Genitourinary: No dysuria  Neurological: No headache, dizziness or syncope Musculoskeletal: No new muscle or joint pain Hematologic: No bruising Psychiatric: No history of depression or anxiety    Physical Exam:  Vital signs in last 24 hours: Temp:  [98.7 F (37.1 C)] 98.7 F (37.1 C) (11/25 1134) Pulse Rate:  [67-70] 67 (11/25 1315) Resp:  [14-19] 19 (11/25 1315) BP: (115-144)/(56-60) 144/56 (11/25 1315) SpO2:  [94 %-100 %] 100 % (11/25 1315) Weight:  [65 kg] 65 kg (11/25 1138)   General:   Pleasant lethargic, pale elderly Caucasian female appears to be in NAD, Well developed, Well nourished Head:  Normocephalic and atraumatic. Eyes:   PEERL, EOMI. No icterus. Conjunctiva pink. Ears:  Normal auditory acuity. Neck:  Supple Throat: Oral cavity and pharynx without inflammation, swelling or lesion. Teeth in good condition. Lungs: Respirations even and unlabored. Lungs clear to auscultation bilaterally.   No wheezes, crackles, or rhonchi.  Heart: Normal S1, S2. No MRG. Regular rate and rhythm. No peripheral edema, cyanosis or  pallor.  Abdomen:  Soft, nondistended, mild epigastric ttp. No rebound or guarding. Normal bowel sounds. No appreciable masses or hepatomegaly. Rectal:  Not performed.  Msk:  Symmetrical without gross deformities. Peripheral pulses intact.  Extremities:  Without edema, no deformity or joint abnormality. Normal ROM, normal sensation. Neurologic:  Alert and  oriented x4;  grossly normal neurologically.  Skin:   Dry and intact without significant lesions or rashes. Psychiatric: Demonstrates good judgement and reason without abnormal affect or behaviors.   LAB RESULTS: Recent Labs    10/12/23 1206  WBC 9.7  HGB 10.3*  HCT 34.1*  PLT 222   BMET Recent Labs    10/12/23 1206  NA 137  K 4.1  CL 103  CO2 24  GLUCOSE 138*  BUN 22  CREATININE 1.05*  CALCIUM 8.8*   LFT Recent Labs    10/12/23 1206  PROT 6.4*  ALBUMIN 3.3*  AST 19  ALT 12  ALKPHOS 121  BILITOT 1.0    Impression / Plan:   Impression: 1.  Anemia: Hemoglobin 10.3 (9.3 on 10/06/2023), recent admission in October with multiple duodenal ulcers thought to be the cause of recent bleeding, started on  a PPI and Aspirin was held, now with further hematemesis starting on 11/24, hemoglobin appears stable; consider bleeding from known ulcers versus small bowel etiology 2.  History of rectal adenocarcinoma status post colectomy with ostomy in place: Underwent chemotherapy at [facility], stool is brown in color and semisolid 3.  Type 2 diabetes 4.  History of CVA: Typically on Plavix and aspirin-aspirin has been held since last admission, last dose of Plavix 11/24  Plan: 1.  Scheduled patient for an EGD with Dr. Leonides Schanz tomorrow.  Did discuss risks, benefits, limitations and alternatives and patient agrees to proceed. Patient is appropriate for endoscopic procedure(s) in the ambulatory ([facility]) setting.  2.  Continue to monitor hemoglobin with transfusion as needed less than 7 3.  Patient to be on a clear liquid diet today and n.p.o.  at midnight 4.  Recommend Pantoprazole 40 mg IV twice daily  Thank you for your kind consultation, we will continue to follow.  Violet Baldy [facility]  10/12/2023, 1:52 PM  Gastroenterology attending  I have seen and evaluated this patient as well.  Records reviewed.  I agree with the assessment and plan as above.  I think it is worthwhile to pursue an EGD and less the CT scan that the hospitalist have ordered reveals a different cause of her problems.  I think reinspecting the duodenum and the strictures that Dr. Chales Abrahams saw last month is worthwhile to understand expected clinical course.  It does not sound like there has been major bleeding.  Patient's Plavix is not completely washed out but I think a diagnostic EGD is reasonable.  Iva Boop, MD, [facility] [place] [facility] See Loretha Stapler on call - gastroenterology for best contact person 10/12/2023 5:03 PM

## 2023-10-12 NOTE — ED Notes (Signed)
ED TO INPATIENT HANDOFF REPORT  ED Nurse Name and Phone #: Morrie Sheldon [phone]  S Name/Age/Gender [name] 77 y.o. female Room/Bed: WA22/WA22  Code Status   Code Status: Prior  Home/SNF/Other Home Patient oriented to: self, place, and time Is this baseline? Yes   Triage Complete: Triage complete  Chief Complaint Upper GI bleed [K92.2]  Triage Note C/o upper abd pain with n/v described as dark brown coffee ground emesis x1 day Patient has ostomy with normal output per patient.  Hx of GI bleed and colorectal cancer.    Allergies Allergies  Allergen Reactions   Codeine Nausea Only    Level of Care/Admitting Diagnosis ED Disposition     ED Disposition  Admit   Condition  --   Comment  Hospital Area: [facility] [[id number]]  Level of Care: Progressive [102]  Admit to Progressive based on following criteria: GI, ENDOCRINE disease patients with GI bleeding, acute liver failure or pancreatitis, stable with diabetic ketoacidosis or thyrotoxicosis (hypothyroid) state.  May place patient in observation at [facility] or [facility] if equivalent level of care is available:: Yes  Covid Evaluation: Asymptomatic - no recent exposure (last 10 days) testing not required  Diagnosis: Upper GI bleed [267195]  Admitting Physician: Maryln Gottron [[id number]]  Attending Physician: Kirby Crigler, Parks Neptune [[id number]]          B Medical/Surgery History Past Medical History:  Diagnosis Date   Arthritis    Basal ganglia stroke (HCC)    March 2022   Bilateral lower extremity edema    Cancer (HCC)    twice- colon cancer, unsure of the other type of cancer   Carotid artery occlusion    Flank pain    Full dentures    History of cervical cancer    11/ 1994  Stage IB  s/p  high dose radiation brachytherapy @ [facility] 01/ 1995--- per pt no recurrence   History of chronic bronchitis    History of hyperthyroidism    d 03/ 2011---due to grave's disease--- s/p  RAI  treatment 04/ 2011   History of kidney stones    History of sepsis 05/03/2018   due to UTI with klebsiella/ pyelonephritis/ ureteral obstruction cause by stone   Hyperlipidemia    Hypothyroidism, postradioiodine therapy    endocrinologist--  dr Everardo All--  dx graves disease and s/p RAI i131 treatement 4/ 2011   Nauseated    Oral thrush 05/03/2018   Pneumonia    Type 2 diabetes mellitus treated with insulin (HCC)    FOLLOWED BY PCP   Urgency of urination    Wears glasses    Past Surgical History:  Procedure Laterality Date   APPENDECTOMY     BIOPSY  01/23/2021   Procedure: BIOPSY;  Surgeon: Napoleon Form, MD;  Location: [facility];  Service: Endoscopy;;   BIOPSY  10/12/2021   Procedure: BIOPSY;  Surgeon: Tressia Danas, MD;  Location: [facility];  Service: Gastroenterology;;   BIOPSY  09/11/2023   Procedure: BIOPSY;  Surgeon: Lynann Bologna, MD;  Location: [facility];  Service: Gastroenterology;;   CAROTID ENDARTERECTOMY Right 03/28/2020   CATARACT EXTRACTION W/ INTRAOCULAR LENS  IMPLANT, BILATERAL  2004  approx.   COLONOSCOPY     COLONOSCOPY WITH PROPOFOL N/A 01/23/2021   Procedure: COLONOSCOPY WITH PROPOFOL;  Surgeon: Napoleon Form, MD;  Location: [facility];  Service: Endoscopy;  Laterality: N/A;   CYSTOSCOPY WITH STENT PLACEMENT Right 05/04/2018   Procedure: CYSTOSCOPY WITH STENT PLACEMENT AND RETROGRADE  [id number];  Surgeon: Rene Paci, MD;  Location: [facility];  Service: Urology;  Laterality: Right;   CYSTOSCOPY/URETEROSCOPY/HOLMIUM LASER/STENT PLACEMENT Right 05/19/2018   Procedure: CYSTOSCOPY, URETEROSCOPY/HOLMIUM LASER, STONE BASKETRY/ STENT EXCHANGE;  Surgeon: Rene Paci, MD;  Location: [facility];  Service: Urology;  Laterality: Right;   ENDARTERECTOMY Right 03/28/2020   Procedure: RIGHT CAROTID ENDARTERECTOMY with PATCH ANGIOPLASTY;  Surgeon: Larina Earthly, MD;  Location: [facility];  Service: Vascular;   Laterality: Right;   ESOPHAGOGASTRODUODENOSCOPY (EGD) WITH PROPOFOL N/A 10/12/2021   Procedure: ESOPHAGOGASTRODUODENOSCOPY (EGD) WITH PROPOFOL;  Surgeon: Tressia Danas, MD;  Location: [facility];  Service: Gastroenterology;  Laterality: N/A;   ESOPHAGOGASTRODUODENOSCOPY (EGD) WITH PROPOFOL N/A 09/11/2023   Procedure: ESOPHAGOGASTRODUODENOSCOPY (EGD) WITH PROPOFOL;  Surgeon: Lynann Bologna, MD;  Location: [facility];  Service: Gastroenterology;  Laterality: N/A;   EXCISION MASS LEFT CHEST WALL  09-20-2007   dr Janee Morn  [facility]   Radioactive Iodine Therapy     for thyroid   REVISION TOTAL HIP ARTHROPLASTY Left early 2000s   SUBMUCOSAL TATTOO INJECTION  01/23/2021   Procedure: SUBMUCOSAL TATTOO INJECTION;  Surgeon: Napoleon Form, MD;  Location: [facility];  Service: Endoscopy;;   TANDEM RING INSERTION  1995   dr Loree Fee @ [facility]   EUA w/ tandem placement in ovid  (for direct high dose radiation brachytherapy , cervical cancer)   TOTAL HIP ARTHROPLASTY Left 1990s   TRANSTHORACIC ECHOCARDIOGRAM  08/07/2017   mild focal basal hypertrophy of the septum,  ef 60-65%, grade 1 diastolic dysfunction/  atrial septum with lipomatous hypertrophy/  trivial PR   UPPER GASTROINTESTINAL ENDOSCOPY       A IV Location/Drains/Wounds Patient Lines/Drains/Airways Status     Active Line/Drains/Airways     Name Placement date Placement time Site Days   Implanted Port Right Chest --  --  Chest  --   Colostomy LLQ 10/08/21  1200  LLQ  734   Airway 09/11/23  1303  -- 31   Wound / Incision (Open or Dehisced) 09/10/23 Non-pressure wound;Other (Comment) Toe (Comment  which one) Right;Posterior Callous to Right great toe; 1cmx1.5cm 09/10/23  2310  Toe (Comment  which one)  32   Wound / Incision (Open or Dehisced) 09/10/23 Non-pressure wound;Other (Comment) Toe (Comment  which one) Left;Posterior Left Great toe callous 0.5cmx0.5cm 09/10/23  2300  Toe (Comment  which one)  32             Intake/Output Last 24 hours No intake or output data in the 24 hours ending 10/12/23 1344  Labs/Imaging Results for orders placed or performed during the hospital encounter of 10/12/23 (from the past 48 hour(s))  Comprehensive metabolic panel     Status: Abnormal   Collection Time: 10/12/23 12:06 PM  Result Value Ref Range   Sodium 137 135 - 145 mmol/L   Potassium 4.1 3.5 - 5.1 mmol/L   Chloride 103 98 - 111 mmol/L   CO2 24 22 - 32 mmol/L   Glucose, Bld 138 (H) 70 - 99 mg/dL    Comment: Glucose reference range applies only to samples taken after fasting for at least 8 hours.   BUN 22 8 - 23 mg/dL   Creatinine, Ser 2.13 (H) 0.44 - 1.00 mg/dL   Calcium 8.8 (L) 8.9 - 10.3 mg/dL   Total Protein 6.4 (L) 6.5 - 8.1 g/dL   Albumin 3.3 (L) 3.5 - 5.0 g/dL   AST 19 15 - 41 U/L   ALT 12 0 - 44  U/L   Alkaline Phosphatase 121 38 - 126 U/L   Total Bilirubin 1.0 <1.2 mg/dL   GFR, Estimated 55 (L) >60 mL/min    Comment: (NOTE) Calculated using the CKD-EPI Creatinine Equation (2021)    Anion gap 10 5 - 15    Comment: Performed at [facility], [address] [place], [place] [address]  CBC     Status: Abnormal   Collection Time: 10/12/23 12:06 PM  Result Value Ref Range   WBC 9.7 4.0 - 10.5 K/uL   RBC 3.70 (L) 3.87 - 5.11 MIL/uL   Hemoglobin 10.3 (L) 12.0 - 15.0 g/dL   HCT 62.9 (L) 52.8 - 41.3 %   MCV 92.2 80.0 - 100.0 fL   MCH 27.8 26.0 - 34.0 pg   MCHC 30.2 30.0 - 36.0 g/dL   RDW 24.4 (H) 01.0 - 27.2 %   Platelets 222 150 - 400 K/uL   nRBC 0.0 0.0 - 0.2 %    Comment: Performed at [facility], [address], [place], [place] [address]  Type and screen [facility]     Status: None   Collection Time: 10/12/23 12:06 PM  Result Value Ref Range   ABO/RH(D) O POS    Antibody Screen NEG    Sample Expiration      10/15/2023,2359 Performed at [facility], [address] [place], [place] [address]    Lipase, blood     Status: None   Collection Time: 10/12/23 12:21 PM  Result Value Ref Range   Lipase 27 11 - 51 U/L    Comment: Performed at [facility], [address], [place], [place] [address]   No results found.  Pending Labs Unresulted Labs (From admission, onward)    None       Vitals/Pain Today's Vitals   10/12/23 1134 10/12/23 1138 10/12/23 1215 10/12/23 1315  BP: 115/60  (!) 129/56 (!) 144/56  Pulse: 68  70 67  Resp: 18  14 19   Temp: 98.7 F (37.1 C)     TempSrc: Oral     SpO2: 94%  98% 100%  Weight:  143 lb 4.8 oz (65 kg)    PainSc:  6       Isolation Precautions No active isolations  Medications Medications  pantoprazole (PROTONIX) injection 40 mg (40 mg Intravenous Given 10/12/23 1222)    Mobility Have not observed pt walk, pt is from home; reports lives with daughter and husband     Focused Assessments Cardiac Assessment Handoff:  Cardiac Rhythm: Normal sinus rhythm No results found for: "CKTOTAL", "CKMB", "CKMBINDEX", "TROPONINI" Lab Results  Component Value Date   DDIMER 2.02 (H) 12/02/2021   Does the Patient currently have chest pain? No    R Recommendations: See Admitting Provider Note  Report given to:   Additional Notes:

## 2023-10-12 NOTE — Consult Note (Addendum)
Consultation  Referring Provider:     Dr. Kirby Crigler Primary Care Physician:  Lewis Moccasin, MD Primary Gastroenterologist: Previously Dr. Orvan Falconer (Dr. Chales Abrahams on recent admission)       Reason for Consultation:    GI bleed         HPI:   [name] is a 77 y.o. female with a past medical history significant for rectal adenocarcinoma (status post colectomy treated with chemo at [facility]) and recurrent cervical adenocarcinoma (5/22), hypertension, CVA on Plavix, diabetes, hypothyroidism, chronic wounds, neuropathy and chronic anemia, who presented to the hospital today with hematemesis.    09/11/2023 consulted by our service for an upper GI bleed for GI bleed.  At that time suspected lower GI bleed with acute on chronic anemia in the setting of Plavix and aspirin with a history of CVA.  EGD at the time of duodenal ulcers with strictures which was thought the likely etiology of upper GI bleed with no active bleeding and erosive gastritis.  Patient treated with IV Protonix.  Recommended repeating EGD in 12 weeks.    Patient is found with her daughter by her bedside.  She explains that patient started vomiting again on Sunday morning 10/11/2023, apparently had a vomiting episode that morning and then again in the evening and then again this morning.  Was noticed to be brown in color with coffee grounds.  Also witnessed by the EMS who was called.  Patient was growing increasingly weak.  Per daughter she emptied her colostomy bag around 7/7:30 last night and it was light brown in color.  It is the same color now.  Only a small amount of stool.  She has been using Pantoprazole as prescribed except for this morning and daughter had held her Aspirin.  Still on Plavix, last dose yesterday.    Denies fever, chills or weight loss.  Previous GI workup: 09/11/2023 EGD with Dr. Chales Abrahams with 2 white open distal esophageal rings noted in the third portion of the esophagus, just above the GE junction, a  few localized 2 to 4 mm erosions with no bleeding and no stigmata of bleeding in the gastric antrum, 112 mm nonbleeding deep cratered duodenal ulcer with a clean ulcer base in the D1/D2 junction, erythematous margins and duodenal narrowing/stricture, linear ulcer noted just beyond this ulcer and proximal to the second portion of the duodenum, another third nonbleeding linear duodenal ulcer with a clean ulcer base in the second portion of the duodenum with stricture  01/08/2022 EGD with Dr. Orvan Falconer for gastric ulcer and persistent symptoms.normal esophagus resolution of gastric ulcers and duodenal ulcer. Erythematous mucosa in the gastric body and atrium.  Biopsied.  Mucosal changes in the duodenum.  Biopsied   Diagnosis 1. Surgical [P], duodenal bulb, 2nd portion of duodenum, and distal duodenum MILD ACUTE DUODENITIS WITH GASTRIC METAPLASIA CONSISTENT WITH PEPTIC DUODENITIS 2. Surgical [P], fundus, gastric antrum, and gastric body REACTIVE GASTROPATHY WITH FOVEOLAR HYPERPLASIA AND FOCAL SURFACE EROSION MILD CHRONIC GASTRITIS NEGATIVE FOR H. PYLORI, INTESTINAL METAPLASIA, DYSPLASIA AND CARCINOMA   10/12/21 EGD with Dr. Orvan Falconer for Nausea, vomiting, progressive anemia and abnormal duodenal wall thickening seen on CT in the setting of locally advanced inoperable rectal adenocarcinoma with LN mets   Impression: - Symptomatic gastric ulcer and duodenal ulcer disease. Biopsies pending. - Possible esophageal plaques were found, suspicious for candidiasis. Biopsied. - LA Grade A reflux esophagitis with no bleeding. - Mucosal changes in the duodenum of unclear clinical significance. Biopsied.   FINAL  MICROSCOPIC DIAGNOSIS:   A. DUODENAL BULB, BIOPSY:  -  Peptic duodenitis  -  No dysplasia or malignancy identified   B. GASTRIC, BIOPSY:  -  Chronic active gastritis  -  No intestinal metaplasia identified  -  See comment   C. ESOPHAGEAL, BIOPSY:  -  Acute fungal esophagitis  -  See comment     01/23/21 colonoscopy with Dr Lavon Paganini for Evaluation of unexplained GI bleeding presenting with Hematochezia, Evaluation of unexplained GI bleeding presenting with fecal occult blood, Lower abdominal pain, Unexplained iron deficiency anemia; Impression: - Stricture in the recto- sigmoid colon. Biopsied. - Rule out malignancy, partially obstructing tumor in the recto- sigmoid colon. Biopsied. Tattooed.   FINAL MICROSCOPIC DIAGNOSIS:   A. RECTUM, BIOPSY:  -  Adenocarcinoma arising in a background of high-grade dysplasia  -  See comment     Past Medical History:  Diagnosis Date   Arthritis    Basal ganglia stroke [facility])    March 2022   Bilateral lower extremity edema    Cancer (HCC)    twice- colon cancer, unsure of the other type of cancer   Carotid artery occlusion    Flank pain    Full dentures    History of cervical cancer    11/ 1994  Stage IB  s/p  high dose radiation brachytherapy @ [facility] 01/ 1995--- per pt no recurrence   History of chronic bronchitis    History of hyperthyroidism    d 03/ 2011---due to grave's disease--- s/p  RAI treatment 04/ 2011   History of kidney stones    History of sepsis 05/03/2018   due to UTI with klebsiella/ pyelonephritis/ ureteral obstruction cause by stone   Hyperlipidemia    Hypothyroidism, postradioiodine therapy    endocrinologist--  dr Everardo All--  dx graves disease and s/p RAI i131 treatement 4/ 2011   Nauseated    Oral thrush 05/03/2018   Pneumonia    Type 2 diabetes mellitus treated with insulin (HCC)    FOLLOWED BY PCP   Urgency of urination    Wears glasses     Past Surgical History:  Procedure Laterality Date   APPENDECTOMY     BIOPSY  01/23/2021   Procedure: BIOPSY;  Surgeon: Napoleon Form, MD;  Location: [facility];  Service: Endoscopy;;   BIOPSY  10/12/2021   Procedure: BIOPSY;  Surgeon: Tressia Danas, MD;  Location: [facility];  Service: Gastroenterology;;   BIOPSY  09/11/2023   Procedure: BIOPSY;  Surgeon:  Lynann Bologna, MD;  Location: [facility];  Service: Gastroenterology;;   CAROTID ENDARTERECTOMY Right 03/28/2020   CATARACT EXTRACTION W/ INTRAOCULAR LENS  IMPLANT, BILATERAL  2004  approx.   COLONOSCOPY     COLONOSCOPY WITH PROPOFOL N/A 01/23/2021   Procedure: COLONOSCOPY WITH PROPOFOL;  Surgeon: Napoleon Form, MD;  Location: [facility];  Service: Endoscopy;  Laterality: N/A;   CYSTOSCOPY WITH STENT PLACEMENT Right 05/04/2018   Procedure: CYSTOSCOPY WITH STENT PLACEMENT AND RETROGRADE PYELOGRAM;  Surgeon: Rene Paci, MD;  Location: [facility];  Service: Urology;  Laterality: Right;   CYSTOSCOPY/URETEROSCOPY/HOLMIUM LASER/STENT PLACEMENT Right 05/19/2018   Procedure: CYSTOSCOPY, URETEROSCOPY/HOLMIUM LASER, STONE BASKETRY/ STENT EXCHANGE;  Surgeon: Rene Paci, MD;  Location: [facility];  Service: Urology;  Laterality: Right;   ENDARTERECTOMY Right 03/28/2020   Procedure: RIGHT CAROTID ENDARTERECTOMY with PATCH ANGIOPLASTY;  Surgeon: Larina Earthly, MD;  Location: [facility];  Service: Vascular;  Laterality: Right;   ESOPHAGOGASTRODUODENOSCOPY (EGD) WITH PROPOFOL N/A 10/12/2021  Procedure: ESOPHAGOGASTRODUODENOSCOPY (EGD) WITH PROPOFOL;  Surgeon: Tressia Danas, MD;  Location: [facility];  Service: Gastroenterology;  Laterality: N/A;   ESOPHAGOGASTRODUODENOSCOPY (EGD) WITH PROPOFOL N/A 09/11/2023   Procedure: ESOPHAGOGASTRODUODENOSCOPY (EGD) WITH PROPOFOL;  Surgeon: Lynann Bologna, MD;  Location: [facility];  Service: Gastroenterology;  Laterality: N/A;   EXCISION MASS LEFT CHEST WALL  09-20-2007   dr Janee Morn  [facility]   Radioactive Iodine Therapy     for thyroid   REVISION TOTAL HIP ARTHROPLASTY Left early 2000s   SUBMUCOSAL TATTOO INJECTION  01/23/2021   Procedure: SUBMUCOSAL TATTOO INJECTION;  Surgeon: Napoleon Form, MD;  Location: [facility];  Service: Endoscopy;;   TANDEM RING INSERTION  1995   dr Loree Fee @ [facility]   EUA w/ tandem  placement in ovid  (for direct high dose radiation brachytherapy , cervical cancer)   TOTAL HIP ARTHROPLASTY Left 1990s   TRANSTHORACIC ECHOCARDIOGRAM  08/07/2017   mild focal basal hypertrophy of the septum,  ef 60-65%, grade 1 diastolic dysfunction/  atrial septum with lipomatous hypertrophy/  trivial PR   UPPER GASTROINTESTINAL ENDOSCOPY      Family History  Problem Relation Age of Onset   Emphysema Father    Diabetes Father    Emphysema Sister    Emphysema Brother    Cancer Mother        Skin Cancer   Diabetes Mother    Colon cancer Neg Hx    Esophageal cancer Neg Hx    Rectal cancer Neg Hx    Stomach cancer Neg Hx     Social History   Tobacco Use   Smoking status: Former    Current packs/day: 0.00    Types: Cigarettes    Start date: 05/13/1977    Quit date: 05/13/1982    Years since quitting: 41.4    Passive exposure: Past   Smokeless tobacco: Never  Vaping Use   Vaping status: Never Used  Substance Use Topics   Alcohol use: No   Drug use: No    Prior to Admission medications   Medication Sig Start Date End Date Taking? Authorizing Provider  ascorbic acid (VITAMIN C) 500 MG tablet Take 1 tablet (500 mg total) by mouth daily. 12/03/21   Rolly Salter, MD  atorvastatin (LIPITOR) 40 MG tablet Take 40 mg by mouth every morning. 12/15/19   [provider]  baclofen (LIORESAL) 10 MG tablet Take 10 mg by mouth 4 (four) times daily as needed for muscle spasms. 03/22/21   [provider]  clopidogrel (PLAVIX) 75 MG tablet Take 1 tablet (75 mg total) by mouth at bedtime. 10/12/21   Willeen Niece, MD  docusate sodium (COLACE) 100 MG capsule Take 100 mg by mouth 2 (two) times daily.    [provider]  feeding supplement, GLUCERNA SHAKE, (GLUCERNA SHAKE) LIQD Take 237 mLs by mouth 3 (three) times daily between meals. Patient not taking: Reported on 10/06/2023 12/02/21   Rolly Salter, MD  furosemide (LASIX) 20 MG tablet Take 20 mg by mouth daily.  11/19/21   [provider]  gabapentin (NEURONTIN) 300 MG capsule Take 300-600 mg by mouth See admin instructions. Take one capsule (300 mg) by mouth every morning and two capsules (600 mg) at night    [provider]  gemfibrozil (LOPID) 600 MG tablet Take 600 mg by mouth 2 (two) times daily.    [provider]  insulin degludec (TRESIBA FLEXTOUCH) 100 UNIT/ML FlexTouch Pen Inject 10 Units into the skin daily. 08/28/21  Osvaldo Shipper, MD  levothyroxine (SYNTHROID) 137 MCG tablet Take 137 mcg by mouth daily before breakfast.    [provider]  MAGnesium-Oxide 400 (240 Mg) MG tablet Take 1 tablet by mouth twice daily 08/10/23   Ladene Artist, MD  melatonin 5 MG TABS Take 1 tablet (5 mg total) by mouth at bedtime. 12/02/21   Rolly Salter, MD  metFORMIN (GLUCOPHAGE) 500 MG tablet Take 500 mg by mouth 2 (two) times daily. 02/01/21   [provider]  Multiple Vitamin (MULTIVITAMIN WITH MINERALS) TABS tablet Take 1 tablet by mouth daily. 12/03/21   Rolly Salter, MD  nystatin (MYCOSTATIN/NYSTOP) powder Apply 1 Application topically 2 (two) times daily. To groin rash 07/24/23   Ladene Artist, MD  ondansetron (ZOFRAN) 8 MG tablet Take 1 tablet (8 mg total) by mouth every 6 (six) hours as needed for nausea or vomiting. Patient not taking: Reported on 10/06/2023 10/31/21   Esterwood, Amy S, PA-C  oxyCODONE ER [facility] ER) 9 MG C12A Take 9 mg by mouth in the morning and at bedtime.    [provider]  pantoprazole (PROTONIX) 40 MG tablet Take 1 tablet (40 mg total) by mouth 2 (two) times daily. 09/13/23   Lewie Chamber, MD  polyethylene glycol powder (GLYCOLAX/MIRALAX) 17 GM/SCOOP powder Take 17 g by mouth daily.    [provider]  sertraline (ZOLOFT) 50 MG tablet Take 50 mg by mouth daily.    [provider]  triamcinolone cream (KENALOG) 0.1 % Apply 1 application  topically 2 (two) times daily as needed (groin rash). Mix with  nystatin cream 09/19/20   [provider]    No current facility-administered medications for this encounter.   Current Outpatient Medications  Medication Sig Dispense Refill   ascorbic acid (VITAMIN C) 500 MG tablet Take 1 tablet (500 mg total) by mouth daily. 30 tablet 0   atorvastatin (LIPITOR) 40 MG tablet Take 40 mg by mouth every morning.     baclofen (LIORESAL) 10 MG tablet Take 10 mg by mouth 4 (four) times daily as needed for muscle spasms.     clopidogrel (PLAVIX) 75 MG tablet Take 1 tablet (75 mg total) by mouth at bedtime. 30 tablet 1   docusate sodium (COLACE) 100 MG capsule Take 100 mg by mouth 2 (two) times daily.     feeding supplement, GLUCERNA SHAKE, (GLUCERNA SHAKE) LIQD Take 237 mLs by mouth 3 (three) times daily between meals. (Patient not taking: Reported on 10/06/2023) 10000 mL 0   furosemide (LASIX) 20 MG tablet Take 20 mg by mouth daily.     gabapentin (NEURONTIN) 300 MG capsule Take 300-600 mg by mouth See admin instructions. Take one capsule (300 mg) by mouth every morning and two capsules (600 mg) at night     gemfibrozil (LOPID) 600 MG tablet Take 600 mg by mouth 2 (two) times daily.     insulin degludec (TRESIBA FLEXTOUCH) 100 UNIT/ML FlexTouch Pen Inject 10 Units into the skin daily.     levothyroxine (SYNTHROID) 137 MCG tablet Take 137 mcg by mouth daily before breakfast.     MAGnesium-Oxide 400 (240 Mg) MG tablet Take 1 tablet by mouth twice daily 60 tablet 2   melatonin 5 MG TABS Take 1 tablet (5 mg total) by mouth at bedtime. 30 tablet 0   metFORMIN (GLUCOPHAGE) 500 MG tablet Take 500 mg by mouth 2 (two) times daily.     Multiple Vitamin (MULTIVITAMIN WITH MINERALS) TABS tablet Take 1  tablet by mouth daily. 30 tablet 0   nystatin (MYCOSTATIN/NYSTOP) powder Apply 1 Application topically 2 (two) times daily. To groin rash 15 g 1   ondansetron (ZOFRAN) 8 MG tablet Take 1 tablet (8 mg total) by mouth every 6 (six) hours as needed for nausea or vomiting.  (Patient not taking: Reported on 10/06/2023) 40 tablet 1   oxyCODONE ER (XTAMPZA ER) 9 MG C12A Take 9 mg by mouth in the morning and at bedtime.     pantoprazole (PROTONIX) 40 MG tablet Take 1 tablet (40 mg total) by mouth 2 (two) times daily. 60 tablet 3   polyethylene glycol powder (GLYCOLAX/MIRALAX) 17 GM/SCOOP powder Take 17 g by mouth daily.     sertraline (ZOLOFT) 50 MG tablet Take 50 mg by mouth daily.     triamcinolone cream (KENALOG) 0.1 % Apply 1 application  topically 2 (two) times daily as needed (groin rash). Mix with nystatin cream      Allergies as of 10/12/2023 - Review Complete 10/12/2023  Allergen Reaction Noted   Codeine Nausea Only 10/19/2008     Review of Systems:    Constitutional: No weight loss, fever or chills Skin: No rash  Cardiovascular: No chest pain   Respiratory: No SOB  Gastrointestinal: See HPI and otherwise negative Genitourinary: No dysuria  Neurological: No headache, dizziness or syncope Musculoskeletal: No new muscle or joint pain Hematologic: No bruising Psychiatric: No history of depression or anxiety    Physical Exam:  Vital signs in last 24 hours: Temp:  [98.7 F (37.1 C)] 98.7 F (37.1 C) (11/25 1134) Pulse Rate:  [67-70] 67 (11/25 1315) Resp:  [14-19] 19 (11/25 1315) BP: (115-144)/(56-60) 144/56 (11/25 1315) SpO2:  [94 %-100 %] 100 % (11/25 1315) Weight:  [65 kg] 65 kg (11/25 1138)   General:   Pleasant lethargic, pale elderly Caucasian female appears to be in NAD, Well developed, Well nourished Head:  Normocephalic and atraumatic. Eyes:   PEERL, EOMI. No icterus. Conjunctiva pink. Ears:  Normal auditory acuity. Neck:  Supple Throat: Oral cavity and pharynx without inflammation, swelling or lesion. Teeth in good condition. Lungs: Respirations even and unlabored. Lungs clear to auscultation bilaterally.   No wheezes, crackles, or rhonchi.  Heart: Normal S1, S2. No MRG. Regular rate and rhythm. No peripheral edema, cyanosis or  pallor.  Abdomen:  Soft, nondistended, mild epigastric ttp. No rebound or guarding. Normal bowel sounds. No appreciable masses or hepatomegaly. Rectal:  Not performed.  Msk:  Symmetrical without gross deformities. Peripheral pulses intact.  Extremities:  Without edema, no deformity or joint abnormality. Normal ROM, normal sensation. Neurologic:  Alert and  oriented x4;  grossly normal neurologically.  Skin:   Dry and intact without significant lesions or rashes. Psychiatric: Demonstrates good judgement and reason without abnormal affect or behaviors.   LAB RESULTS: Recent Labs    10/12/23 1206  WBC 9.7  HGB 10.3*  HCT 34.1*  PLT 222   BMET Recent Labs    10/12/23 1206  NA 137  K 4.1  CL 103  CO2 24  GLUCOSE 138*  BUN 22  CREATININE 1.05*  CALCIUM 8.8*   LFT Recent Labs    10/12/23 1206  PROT 6.4*  ALBUMIN 3.3*  AST 19  ALT 12  ALKPHOS 121  BILITOT 1.0    Impression / Plan:   Impression: 1.  Anemia: Hemoglobin 10.3 (9.3 on 10/06/2023), recent admission in October with multiple duodenal ulcers thought to be the cause of recent bleeding, started on  a PPI and Aspirin was held, now with further hematemesis starting on 11/24, hemoglobin appears stable; consider bleeding from known ulcers versus small bowel etiology 2.  History of rectal adenocarcinoma status post colectomy with ostomy in place: Underwent chemotherapy at [facility], stool is brown in color and semisolid 3.  Type 2 diabetes 4.  History of CVA: Typically on Plavix and aspirin-aspirin has been held since last admission, last dose of Plavix 11/24  Plan: 1.  Scheduled patient for an EGD with Dr. Leonides Schanz tomorrow.  Did discuss risks, benefits, limitations and alternatives and patient agrees to proceed. Patient is appropriate for endoscopic procedure(s) in the ambulatory ([facility]) setting.  2.  Continue to monitor hemoglobin with transfusion as needed less than 7 3.  Patient to be on a clear liquid diet today and n.p.o.  at midnight 4.  Recommend Pantoprazole 40 mg IV twice daily  Thank you for your kind consultation, we will continue to follow.  Violet Baldy [facility]  10/12/2023, 1:52 PM  Gastroenterology attending  I have seen and evaluated this patient as well.  Records reviewed.  I agree with the assessment and plan as above.  I think it is worthwhile to pursue an EGD and less the CT scan that the hospitalist have ordered reveals a different cause of her problems.  I think reinspecting the duodenum and the strictures that Dr. Chales Abrahams saw last month is worthwhile to understand expected clinical course.  It does not sound like there has been major bleeding.  Patient's Plavix is not completely washed out but I think a diagnostic EGD is reasonable.  Iva Boop, MD, [facility] [place] See Loretha Stapler on call - gastroenterology for best contact person 10/12/2023 5:03 PM

## 2023-10-12 NOTE — H&P (Signed)
History and Physical  COUA CAUDILL ZOX:[id number] DOB: [birth year] DOA: 10/12/2023  PCP: Lewis Moccasin, MD   Chief Complaint: Abdominal pain, vomiting  HPI: [name] is a 77 y.o. female with medical history significant for rectal/cervical adenocarcinoma with colostomy, insulin-dependent type 2 diabetes, CVA previously on aspirin and Plavix, recent upper GI bleed only on Plavix being admitted to the hospital with recurrent abdominal pain and vomiting.  History is provided by the patient as well as her daughter who is at the bedside.  She was recently admitted to [facility] long from 10/24 to 10/27 with concern for upper GI bleed, found at that time to have hemoglobin of 3.9.  She was transfused 3 units of blood, had upper endoscopy on 10/25 which found erosive gastritis and 3 duodenal ulcers.  She remained hemodynamically stable, her Plavix was continued at discharge but aspirin was discontinued.  She has been doing well since her discharge from the hospital, but in the last couple of days she has had recurrent abdominal pain, vomiting with coffee grounds.  Daughter is concerned also that she seems bloated, and has not had any ostomy output since yesterday.  ER provider discussed with gastroenterology and consulted hospitalist for admission due to concern for recurrent upper GI bleeding.  Review of Systems: Please see HPI for pertinent positives and negatives. A complete 10 system review of systems are otherwise negative.  Past Medical History:  Diagnosis Date   Arthritis    Basal ganglia stroke [facility])    March 2022   Bilateral lower extremity edema    Cancer (HCC)    twice- colon cancer, unsure of the other type of cancer   Carotid artery occlusion    Flank pain    Full dentures    History of cervical cancer    11/ 1994  Stage IB  s/p  high dose radiation brachytherapy @ [facility] 01/ 1995--- per pt no recurrence   History of chronic bronchitis    History of hyperthyroidism    d 03/  2011---due to grave's disease--- s/p  RAI treatment 04/ 2011   History of kidney stones    History of sepsis 05/03/2018   due to UTI with klebsiella/ pyelonephritis/ ureteral obstruction cause by stone   Hyperlipidemia    Hypothyroidism, postradioiodine therapy    endocrinologist--  dr Everardo All--  dx graves disease and s/p RAI i131 treatement 4/ 2011   Nauseated    Oral thrush 05/03/2018   Pneumonia    Type 2 diabetes mellitus treated with insulin (HCC)    FOLLOWED BY PCP   Urgency of urination    Wears glasses    Past Surgical History:  Procedure Laterality Date   APPENDECTOMY     BIOPSY  01/23/2021   Procedure: BIOPSY;  Surgeon: Napoleon Form, MD;  Location: [facility];  Service: Endoscopy;;   BIOPSY  10/12/2021   Procedure: BIOPSY;  Surgeon: Tressia Danas, MD;  Location: [facility];  Service: Gastroenterology;;   BIOPSY  09/11/2023   Procedure: BIOPSY;  Surgeon: Lynann Bologna, MD;  Location: [facility];  Service: Gastroenterology;;   CAROTID ENDARTERECTOMY Right 03/28/2020   CATARACT EXTRACTION W/ INTRAOCULAR LENS  IMPLANT, BILATERAL  2004  approx.   COLONOSCOPY     COLONOSCOPY WITH PROPOFOL N/A 01/23/2021   Procedure: COLONOSCOPY WITH PROPOFOL;  Surgeon: Napoleon Form, MD;  Location: [facility];  Service: Endoscopy;  Laterality: N/A;   CYSTOSCOPY WITH STENT PLACEMENT Right 05/04/2018   Procedure: CYSTOSCOPY WITH STENT PLACEMENT AND  RETROGRADE PYELOGRAM;  Surgeon: Rene Paci, MD;  Location: [facility];  Service: Urology;  Laterality: Right;   CYSTOSCOPY/URETEROSCOPY/HOLMIUM LASER/STENT PLACEMENT Right 05/19/2018   Procedure: CYSTOSCOPY, URETEROSCOPY/HOLMIUM LASER, STONE BASKETRY/ STENT EXCHANGE;  Surgeon: Rene Paci, MD;  Location: [facility];  Service: Urology;  Laterality: Right;   ENDARTERECTOMY Right 03/28/2020   Procedure: RIGHT CAROTID ENDARTERECTOMY with PATCH ANGIOPLASTY;  Surgeon: Larina Earthly, MD;   Location: [facility];  Service: Vascular;  Laterality: Right;   ESOPHAGOGASTRODUODENOSCOPY (EGD) WITH PROPOFOL N/A 10/12/2021   Procedure: ESOPHAGOGASTRODUODENOSCOPY (EGD) WITH PROPOFOL;  Surgeon: Tressia Danas, MD;  Location: [facility];  Service: Gastroenterology;  Laterality: N/A;   ESOPHAGOGASTRODUODENOSCOPY (EGD) WITH PROPOFOL N/A 09/11/2023   Procedure: ESOPHAGOGASTRODUODENOSCOPY (EGD) WITH PROPOFOL;  Surgeon: Lynann Bologna, MD;  Location: [facility];  Service: Gastroenterology;  Laterality: N/A;   EXCISION MASS LEFT CHEST WALL  09-20-2007   dr Janee Morn  [facility]   Radioactive Iodine Therapy     for thyroid   REVISION TOTAL HIP ARTHROPLASTY Left early 2000s   SUBMUCOSAL TATTOO INJECTION  01/23/2021   Procedure: SUBMUCOSAL TATTOO INJECTION;  Surgeon: Napoleon Form, MD;  Location: [facility];  Service: Endoscopy;;   TANDEM RING INSERTION  1995   dr Loree Fee @ [facility]   EUA w/ tandem placement in ovid  (for direct high dose radiation brachytherapy , cervical cancer)   TOTAL HIP ARTHROPLASTY Left 1990s   TRANSTHORACIC ECHOCARDIOGRAM  08/07/2017   mild focal basal hypertrophy of the septum,  ef 60-65%, grade 1 diastolic dysfunction/  atrial septum with lipomatous hypertrophy/  trivial PR   UPPER GASTROINTESTINAL ENDOSCOPY      Social History:  reports that she quit smoking about 41 years ago. Her smoking use included cigarettes. She started smoking about 46 years ago. She has been exposed to tobacco smoke. She has never used smokeless tobacco. She reports that she does not drink alcohol and does not use drugs.   Allergies  Allergen Reactions   Codeine Nausea Only    Family History  Problem Relation Age of Onset   Emphysema Father    Diabetes Father    Emphysema Sister    Emphysema Brother    Cancer Mother        Skin Cancer   Diabetes Mother    Colon cancer Neg Hx    Esophageal cancer Neg Hx    Rectal cancer Neg Hx    Stomach cancer Neg Hx      Prior to Admission  medications   Medication Sig Start Date End Date Taking? Authorizing Provider  ascorbic acid (VITAMIN C) 500 MG tablet Take 1 tablet (500 mg total) by mouth daily. 12/03/21   Rolly Salter, MD  atorvastatin (LIPITOR) 40 MG tablet Take 40 mg by mouth every morning. 12/15/19   [provider]  baclofen (LIORESAL) 10 MG tablet Take 10 mg by mouth 4 (four) times daily as needed for muscle spasms. 03/22/21   [provider]  clopidogrel (PLAVIX) 75 MG tablet Take 1 tablet (75 mg total) by mouth at bedtime. 10/12/21   Willeen Niece, MD  docusate sodium (COLACE) 100 MG capsule Take 100 mg by mouth 2 (two) times daily.    [provider]  feeding supplement, GLUCERNA SHAKE, (GLUCERNA SHAKE) LIQD Take 237 mLs by mouth 3 (three) times daily between meals. Patient not taking: Reported on 10/06/2023 12/02/21   Rolly Salter, MD  furosemide (LASIX) 20 MG tablet Take 20 mg by mouth daily. 11/19/21  [provider]  gabapentin (NEURONTIN) 300 MG capsule Take 300-600 mg by mouth See admin instructions. Take one capsule (300 mg) by mouth every morning and two capsules (600 mg) at night    [provider]  gemfibrozil (LOPID) 600 MG tablet Take 600 mg by mouth 2 (two) times daily.    [provider]  insulin degludec (TRESIBA FLEXTOUCH) 100 UNIT/ML FlexTouch Pen Inject 10 Units into the skin daily. 08/28/21   Osvaldo Shipper, MD  levothyroxine (SYNTHROID) 137 MCG tablet Take 137 mcg by mouth daily before breakfast.    [provider]  MAGnesium-Oxide 400 (240 Mg) MG tablet Take 1 tablet by mouth twice daily 08/10/23   Ladene Artist, MD  melatonin 5 MG TABS Take 1 tablet (5 mg total) by mouth at bedtime. 12/02/21   Rolly Salter, MD  metFORMIN (GLUCOPHAGE) 500 MG tablet Take 500 mg by mouth 2 (two) times daily. 02/01/21   [provider]  Multiple Vitamin (MULTIVITAMIN WITH MINERALS) TABS tablet Take 1 tablet by mouth daily. 12/03/21   Rolly Salter, MD  nystatin (MYCOSTATIN/NYSTOP) powder Apply 1 Application topically 2 (two) times daily. To groin rash 07/24/23   Ladene Artist, MD  ondansetron (ZOFRAN) 8 MG tablet Take 1 tablet (8 mg total) by mouth every 6 (six) hours as needed for nausea or vomiting. Patient not taking: Reported on 10/06/2023 10/31/21   Esterwood, Amy S, PA-C  oxyCODONE ER [facility]) 9 MG C12A Take 9 mg by mouth in the morning and at bedtime.    [provider]  pantoprazole (PROTONIX) 40 MG tablet Take 1 tablet (40 mg total) by mouth 2 (two) times daily. 09/13/23   Lewie Chamber, MD  polyethylene glycol powder (GLYCOLAX/MIRALAX) 17 GM/SCOOP powder Take 17 g by mouth daily.    [provider]  sertraline (ZOLOFT) 50 MG tablet Take 50 mg by mouth daily.    [provider]  triamcinolone cream (KENALOG) 0.1 % Apply 1 application  topically 2 (two) times daily as needed (groin rash). Mix with nystatin cream 09/19/20   [provider]    Physical Exam: BP (!) 140/57 (BP Location: Right Arm)   Pulse 65   Temp 98.3 F (36.8 C) (Oral)   Resp 19   Wt 65 kg   SpO2 99%   BMI 27.99 kg/m   General:  Alert, oriented, calm, in no acute distress, her daughter is at the bedside.  Patient is resting comfortably on room air, she looks a little bit pale to me. Cardiovascular: RRR, no murmurs or rubs, no peripheral edema  Respiratory: clear to auscultation bilaterally, no wheezes, no crackles  Abdomen: soft, tender, distended, no bowel tones heard, ostomy in place with a small amount of brown stool, which daughter states has been unchanged since last night Skin: dry, no rashes  Musculoskeletal: no joint effusions, normal range of motion  Psychiatric: appropriate affect, normal speech  Neurologic: extraocular muscles intact, clear speech, moving all extremities with intact sensorium         Labs on Admission:  Basic Metabolic Panel: Recent Labs  Lab 10/06/23 1415 10/12/23 1206   NA 142 137  K 3.9 4.1  CL 109 103  CO2 24 24  GLUCOSE 244* 138*  BUN 16 22  CREATININE 0.98 1.05*  CALCIUM 8.4* 8.8*  MG 1.3*  --    Liver Function Tests: Recent Labs  Lab 10/06/23 1415 10/12/23 1206  AST 14* 19  ALT 7 12  ALKPHOS 125 121  BILITOT 0.4 1.0  PROT 6.2* 6.4*  ALBUMIN 4.1 3.3*   Recent Labs  Lab 10/12/23 1221  LIPASE 27   No results for input(s): "AMMONIA" in the last 168 hours. CBC: Recent Labs  Lab 10/06/23 1415 10/12/23 1206  WBC 6.9 9.7  NEUTROABS 5.5  --   HGB 9.3* 10.3*  HCT 30.3* 34.1*  MCV 90.4 92.2  PLT 185 222   Cardiac Enzymes: No results for input(s): "CKTOTAL", "CKMB", "CKMBINDEX", "TROPONINI" in the last 168 hours.  BNP (last 3 results) Recent Labs    04/22/23 1854  BNP 103.4*    ProBNP (last 3 results) No results for input(s): "PROBNP" in the last 8760 hours.  CBG: No results for input(s): "GLUCAP" in the last 168 hours.  Radiological Exams on Admission: No results found.  Assessment/Plan [name] is a 77 y.o. female with medical history significant for rectal/cervical adenocarcinoma with colostomy, insulin-dependent type 2 diabetes, CVA previously on aspirin and Plavix, recent upper GI bleed only on Plavix being admitted to the hospital with recurrent abdominal pain and vomiting.   Recurrent abdominal pain and vomiting-May be due to worsening/recurrent ulceration and upper GI bleeding, though I am concerned about obstruction given her bloating, lack of bowel movement, and vomiting. -Observation admission to progressive -Stat CT abdomen pelvis with contrast -Keep n.p.o. in the meantime -Pain and nausea medication as needed -Normal saline at 100 cc/h -Follow-up CT scan and consider general surgery consultation if indicated -Patient is being seen by gastroenterology due to concern for possible recurrent upper GI bleed  Duodenal ulcerations, erosive gastritis and upper GI bleed-recently hospitalized last month  for this.  She is currently hemodynamically stable and her anemia is at baseline. -Keep n.p.o. for now and follow-up as above -Was recently discharged on Plavix, aspirin was discontinued.  Will hold both for now. -Trend hemoglobin every 8 hours  Insulin-dependent type 2 diabetes-very well-controlled -Patient is n.p.o. for now, will maintain with sliding scale insulin -Carb controlled diet when advanced  Hypothyroidism-Synthroid  History of CVA, carotid stenosis status post endarterectomy-previously on aspirin and Plavix, aspirin was discontinued after discussion with vascular and neurology in 10/24.  If she is truly having recurrent upper GI bleeding, may need to discontinue Plavix as well.  DVT prophylaxis: SCDs only    Code Status: Full Code  Consults called: Gastroenterology  Admission status: Observation  Time spent: 68 minutes  Tonetta Napoles Sharlette Dense MD [facility] Pager [phone]  If 7PM-7AM, please contact night-coverage [email or web address] Password [facility]  10/12/2023, 2:34 PM

## 2023-10-13 ENCOUNTER — Observation Stay (HOSPITAL_COMMUNITY): Payer: Medicare HMO

## 2023-10-13 ENCOUNTER — Encounter (HOSPITAL_COMMUNITY)
Admission: EM | Disposition: A | Discharge status: Home / Self Care | Payer: Self-pay | Source: Home / Self Care | Attending: Emergency Medicine

## 2023-10-13 DIAGNOSIS — K2970 Gastritis, unspecified, without bleeding: Secondary | ICD-10-CM | POA: Diagnosis not present

## 2023-10-13 DIAGNOSIS — K31819 Angiodysplasia of stomach and duodenum without bleeding: Secondary | ICD-10-CM | POA: Diagnosis not present

## 2023-10-13 DIAGNOSIS — K315 Obstruction of duodenum: Secondary | ICD-10-CM | POA: Diagnosis not present

## 2023-10-13 DIAGNOSIS — D649 Anemia, unspecified: Secondary | ICD-10-CM | POA: Diagnosis not present

## 2023-10-13 DIAGNOSIS — K222 Esophageal obstruction: Secondary | ICD-10-CM

## 2023-10-13 DIAGNOSIS — K2950 Unspecified chronic gastritis without bleeding: Secondary | ICD-10-CM | POA: Diagnosis not present

## 2023-10-13 DIAGNOSIS — K922 Gastrointestinal hemorrhage, unspecified: Secondary | ICD-10-CM | POA: Diagnosis not present

## 2023-10-13 HISTORY — PX: BIOPSY: SHX5522

## 2023-10-13 HISTORY — PX: HOT HEMOSTASIS: SHX5433

## 2023-10-13 HISTORY — PX: ENTEROSCOPY: SHX5533

## 2023-10-13 LAB — CBC
HCT: 31.1 % — ABNORMAL LOW (ref 36.0–46.0)
Hemoglobin: 9 g/dL — ABNORMAL LOW (ref 12.0–15.0)
MCH: 27.3 pg (ref 26.0–34.0)
MCHC: 28.9 g/dL — ABNORMAL LOW (ref 30.0–36.0)
MCV: 94.2 fL (ref 80.0–100.0)
Platelets: 196 10*3/uL (ref 150–400)
RBC: 3.3 MIL/uL — ABNORMAL LOW (ref 3.87–5.11)
RDW: 16.1 % — ABNORMAL HIGH (ref 11.5–15.5)
WBC: 5.7 10*3/uL (ref 4.0–10.5)
nRBC: 0 % (ref 0.0–0.2)

## 2023-10-13 LAB — BASIC METABOLIC PANEL
Anion gap: 12 (ref 5–15)
BUN: 23 mg/dL (ref 8–23)
CO2: 22 mmol/L (ref 22–32)
Calcium: 8.2 mg/dL — ABNORMAL LOW (ref 8.9–10.3)
Chloride: 107 mmol/L (ref 98–111)
Creatinine, Ser: 0.99 mg/dL (ref 0.44–1.00)
GFR, Estimated: 59 mL/min — ABNORMAL LOW (ref >60)
Glucose, Bld: 81 mg/dL (ref 70–99)
Potassium: 3.5 mmol/L (ref 3.5–5.1)
Sodium: 141 mmol/L (ref 135–145)

## 2023-10-13 LAB — HEMOGLOBIN AND HEMATOCRIT, BLOOD
HCT: 33.2 % — ABNORMAL LOW (ref 36.0–46.0)
HCT: 34.3 % — ABNORMAL LOW (ref 36.0–46.0)
Hemoglobin: 10 g/dL — ABNORMAL LOW (ref 12.0–15.0)
Hemoglobin: 10.4 g/dL — ABNORMAL LOW (ref 12.0–15.0)

## 2023-10-13 LAB — GLUCOSE, CAPILLARY
Glucose-Capillary: 106 mg/dL — ABNORMAL HIGH (ref 70–99)
Glucose-Capillary: 96 mg/dL (ref 70–99)
Glucose-Capillary: 94 mg/dL (ref 70–99)
Glucose-Capillary: 90 mg/dL (ref 70–99)
Glucose-Capillary: 196 mg/dL — ABNORMAL HIGH (ref 70–99)

## 2023-10-13 LAB — PROTIME-INR
INR: 1.2 (ref 0.8–1.2)
Prothrombin Time: 15.5 s — ABNORMAL HIGH (ref 11.4–15.2)

## 2023-10-13 SURGERY — ENTEROSCOPY
Anesthesia: Monitor Anesthesia Care

## 2023-10-13 MED ORDER — SODIUM CHLORIDE 0.9% FLUSH
10.0000 mL | Freq: Two times a day (BID) | INTRAVENOUS | Status: DC
  Administered 2023-10-13 – 2023-10-14 (×4): 10 mL

## 2023-10-13 MED ORDER — SODIUM CHLORIDE 0.9 % IV SOLN: INTRAVENOUS | Status: DC | PRN

## 2023-10-13 MED ORDER — PROPOFOL 10 MG/ML IV BOLUS
INTRAVENOUS | Status: AC
  Filled 2023-10-13: qty 20

## 2023-10-13 MED ORDER — HYDROMORPHONE HCL 1 MG/ML IJ SOLN
0.5000 mg | Freq: Once | INTRAMUSCULAR | Status: AC | PRN
  Administered 2023-10-13: 0.5 mg via INTRAVENOUS
  Filled 2023-10-13: qty 0.5

## 2023-10-13 MED ORDER — SUCRALFATE 1 GM/10ML PO SUSP
1.0000 g | Freq: Three times a day (TID) | ORAL | Status: DC
  Administered 2023-10-13 – 2023-10-14 (×3): 1 g via ORAL
  Filled 2023-10-13 (×4): qty 10

## 2023-10-13 MED ORDER — SODIUM CHLORIDE 0.9% FLUSH: 10.0000 mL | INTRAVENOUS | Status: DC | PRN

## 2023-10-13 MED ORDER — CHLORHEXIDINE GLUCONATE CLOTH 2 % EX PADS
6.0000 | MEDICATED_PAD | Freq: Every day | CUTANEOUS | Status: DC
  Administered 2023-10-13 – 2023-10-14 (×2): 6 via TOPICAL

## 2023-10-13 MED ORDER — PROPOFOL 10 MG/ML IV BOLUS
INTRAVENOUS | Status: DC | PRN
  Administered 2023-10-13: 40 mg via INTRAVENOUS
  Administered 2023-10-13: 30 mg via INTRAVENOUS
  Administered 2023-10-13: 50 mg via INTRAVENOUS
  Administered 2023-10-13: 30 mg via INTRAVENOUS

## 2023-10-13 MED ORDER — PROPOFOL 500 MG/50ML IV EMUL
INTRAVENOUS | Status: AC
  Filled 2023-10-13: qty 50

## 2023-10-13 MED ORDER — PROPOFOL 500 MG/50ML IV EMUL
INTRAVENOUS | Status: DC | PRN
  Administered 2023-10-13: 85 ug/kg/min via INTRAVENOUS

## 2023-10-13 SURGICAL SUPPLY — 14 items

## 2023-10-13 NOTE — Progress Notes (Signed)
PROGRESS NOTE    [name]  EXB:[id number] DOB: [birth year] DOA: 10/12/2023 PCP: Lewis Moccasin, MD   Brief Narrative: [name] is a 77 y.o. female with a history of rectal/cervical adenocarcinoma s/p diverting loop ostomy with colostomy, diabetes mellitus type 2, CVA, upper GI bleeding.  Patient presented secondary to abdominal pain, nausea and vomiting with associated coffee grounds concerning for upper GI bleeding. GI consulted and performed an upper GI endoscopy.   Assessment and Plan:  Abdominal pain Acute from less than one week prior to admission. Right sided. CT imaging without obvious etiology. Some gallbladder distention without cholecystitis noted; no ductal dilation; no LFT elevation. -Check HIDA  GI Bleeding Patient with a history of multiple duodenal ulcers, complicated by Plavix use. GI consulted and performed an upper GI endoscopy on 11/26 revealing gastritis (biopsied), duodenal stenosis, and a single non-bleeding angioectasia in the duodenum treated with APC. -GI recommendations: PPI BID x10 weeks then every day, sucralfate suspension 1 g QID x4 weeks, soft diet, restart Plavix on 11/27  Chronic anemia Hemoglobin baseline of about 9-10. Currently stable.  Diabetes mellitus type 2, well controlled Last hemoglobin A1C of 5.7%. patient is on metformin as an outpatient. -Continue SSI  Chronic pain -Continue Oxycontin, Baclofen, gabapentin  Depression -Continue Zoloft  Hypothyroidism -Continue Synthroid  Hyperlipidemia History of CVA Plavix held secondary to concern for upper GI bleeding. -Continue Lipitor   DVT prophylaxis: SCDs Code Status:   Code Status: Full Code Family Communication: Daughter at bedside Disposition Plan: Discharge pending ongoing workup for abdominal pain   Consultants:  [facility]  Procedures:  Upper GI endoscopy  Antimicrobials: None    Subjective: Patient reports ongoing abdominal pain.  Nausea/vomiting improved. Having bowel movements in ostomy.  Objective: BP (!) 140/47   Pulse 89   Temp 97.7 F (36.5 C) (Temporal)   Resp (!) 21   Ht 5' (1.524 m)   Wt 61.7 kg   SpO2 100%   BMI 26.57 kg/m   Examination:  General exam: Appears calm and comfortable Respiratory system: Clear to auscultation. Respiratory effort normal. Cardiovascular system: S1 & S2 heard, RRR. No murmurs, rubs, gallops or clicks. Gastrointestinal system: Abdomen is nondistended, soft and significant tender in right quadrant. Normal bowel sounds heard. Central nervous system: Alert and oriented. No focal neurological deficits. Psychiatry: Judgement and insight appear normal. Mood & affect appropriate.    Data Reviewed: I have personally reviewed following labs and imaging studies  CBC Lab Results  Component Value Date   WBC 5.7 10/13/2023   RBC 3.30 (L) 10/13/2023   HGB 10.0 (L) 10/13/2023   HCT 33.2 (L) 10/13/2023   MCV 94.2 10/13/2023   MCH 27.3 10/13/2023   PLT 196 10/13/2023   MCHC 28.9 (L) 10/13/2023   RDW 16.1 (H) 10/13/2023   LYMPHSABS 0.8 10/06/2023   MONOABS 0.4 10/06/2023   EOSABS 0.1 10/06/2023   BASOSABS 0.0 10/06/2023     Last metabolic panel Lab Results  Component Value Date   NA 141 10/13/2023   K 3.5 10/13/2023   CL 107 10/13/2023   CO2 22 10/13/2023   BUN 23 10/13/2023   CREATININE 0.99 10/13/2023   GLUCOSE 81 10/13/2023   GFRNONAA 59 (L) 10/13/2023   GFRAA >60 03/29/2020   CALCIUM 8.2 (L) 10/13/2023   PHOS 3.5 09/11/2023   PROT 6.4 (L) 10/12/2023   ALBUMIN 3.3 (L) 10/12/2023   BILITOT 1.0 10/12/2023   ALKPHOS 121 10/12/2023   AST 19 10/12/2023  ALT 12 10/12/2023   ANIONGAP 12 10/13/2023    GFR: Estimated Creatinine Clearance: 39.1 mL/min (by C-G formula based on SCr of 0.99 mg/dL).  No results found for this or any previous visit (from the past 240 hour(s)).    Radiology Studies: CT ABDOMEN PELVIS W CONTRAST  Result Date:  10/12/2023 CLINICAL DATA:  Inpatient. Abdominal pain, acute, nonlocalized. Rectal and cervical cancer. * Tracking Code: BO * EXAM: CT ABDOMEN AND PELVIS WITH CONTRAST TECHNIQUE: Multidetector CT imaging of the abdomen and pelvis was performed using the standard protocol following bolus administration of intravenous contrast. RADIATION DOSE REDUCTION: This exam was performed according to the departmental dose-optimization program which includes automated exposure control, adjustment of the mA and/or kV according to patient size and/or use of iterative reconstruction technique. CONTRAST:  OMNIPAQUE IOHEXOL 300 MG/ML  SOLN COMPARISON:  01/21/2022 CT chest, abdomen and pelvis. 04/22/2023 CT pelvis. FINDINGS: Lower chest: Tip of superior approach central venous catheter is seen at the cavoatrial junction. Hepatobiliary: Normal liver size. No liver mass. Moderately distended gallbladder. No radiopaque cholelithiasis. No gallbladder wall thickening. No biliary ductal dilatation. Pancreas: Normal, with no mass or duct dilation. Spleen: Normal size. No mass. Adrenals/Urinary Tract: Normal adrenals. Moderate asymmetric right renal atrophy with multifocal renal cortical scarring in the right kidney, unchanged. No hydronephrosis. No renal masses. Bladder obscured by streak artifact from left hip hardware. Collapsed bladder with suggestion of mild diffuse bladder wall thickening. Stomach/Bowel: Normal non-distended stomach. Normal caliber small bowel with no small bowel wall thickening. Appendectomy. Diverting descending colon loop colostomy in the ventral left abdominal wall. Suggestion of a 5.4 x 3.4 cm irregular annular rectal mass, poorly visualized due to streak artifact from left hip hardware (series 2/image 70). Otherwise no large bowel wall thickening or significant diverticulosis. Vascular/Lymphatic: Atherosclerotic nonaneurysmal abdominal aorta. Patent portal, splenic, hepatic and renal veins. Mildly enlarged  1.1 cm right inguinal node (series 2/image 76), unchanged from 04/22/2023 CT. No new pathologically enlarged abdominopelvic nodes. Reproductive: Apparent hysterectomy.  No adnexal masses. Other: Small volume ascites, predominantly right pericolic gutter and pelvic. No pneumoperitoneum. No focal fluid collection. Musculoskeletal: No aggressive appearing focal osseous lesions. Mild thoracolumbar spondylosis. Left total hip arthroplasty. IMPRESSION: 1. No evidence of bowel obstruction or acute bowel inflammation. 2. Suggestion of a 5.4 x 3.4 cm irregular annular rectal mass, poorly visualized due to streak artifact from left hip hardware. Viable rectal tumor not excluded. 3. Small volume ascites, predominantly right paracolic gutter and pelvic. 4. Mildly enlarged right inguinal node, unchanged from 04/22/2023 CT. No new pathologically enlarged abdominopelvic nodes. 5. Collapsed bladder with suggestion of mild diffuse bladder wall thickening, nonspecific, cannot exclude cystitis. Suggest correlation with urinalysis. No hydronephrosis. 6. Moderately distended gallbladder, nonspecific. No radiopaque cholelithiasis. No gallbladder wall thickening. No biliary ductal dilatation. 7. Chronic moderate asymmetric right renal atrophy. 8.  Aortic Atherosclerosis (ICD10-I70.0). Electronically Signed   By: Delbert Phenix M.D.   On: 10/12/2023 19:40      LOS: 0 days    Jacquelin Hawking, MD [facility] 10/13/2023, 3:46 PM   If 7PM-7AM, please contact night-coverage [email or web address]

## 2023-10-13 NOTE — Anesthesia Procedure Notes (Signed)
Procedure Name: MAC Date/Time: 10/13/2023 2:42 PM  Performed by: Maurene Capes, CRNAPre-anesthesia Checklist: Patient identified, Emergency Drugs available, Suction available, Patient being monitored and Timeout performed Patient Re-evaluated:Patient Re-evaluated prior to induction Oxygen Delivery Method: Simple face mask Preoxygenation: Pre-oxygenation with 100% oxygen Induction Type: IV induction Placement Confirmation: positive ETCO2 Dental Injury: Teeth and Oropharynx as per pre-operative assessment

## 2023-10-13 NOTE — Progress Notes (Signed)
Progress Note   Subjective  Chief Complaint: GI bleeding  This morning, patient tells me she had a 10 /10 epigastric pain when she woke up.  She has not had anything to eat in preparation for her EGD later.  Aware of plans for workup.  She tells me she wants [place] to figure out what is going on so that she does not hurt anymore.  Her colostomy bag still has brown stool that she tells me has gotten slightly more liquidy and they had to empty overnight.   Objective   Vital signs in last 24 hours: Temp:  [98.1 F (36.7 C)-98.7 F (37.1 C)] 98.2 F (36.8 C) (11/26 0551) Pulse Rate:  [65-70] 69 (11/26 0551) Resp:  [14-20] 20 (11/26 0551) BP: (115-144)/(44-60) 142/44 (11/26 0551) SpO2:  [94 %-100 %] 96 % (11/26 0551) Weight:  [61.7 kg-65 kg] 61.7 kg (11/25 2007) Last BM Date : 10/12/23 General:   Elderly white female in NAD Heart:  Regular rate and rhythm; no murmurs Lungs: Respirations even and unlabored, lungs CTA bilaterally Abdomen:  Soft, nontender and nondistended. Normal bowel sounds.+ Colostomy with brown liquid stool Psych:  Cooperative. Normal mood and affect.  Intake/Output from previous day: 11/25 0701 - 11/26 0700 In: 998 [P.O.:240; I.V.:758] Out: 1500 [Stool:1500]   Lab Results: Recent Labs    10/12/23 1206 10/13/23 0103  WBC 9.7 5.7  HGB 10.3* 9.0*  HCT 34.1* 31.1*  PLT 222 196   BMET Recent Labs    10/12/23 1206 10/13/23 0103  NA 137 141  K 4.1 3.5  CL 103 107  CO2 24 22  GLUCOSE 138* 81  BUN 22 23  CREATININE 1.05* 0.99  CALCIUM 8.8* 8.2*   LFT Recent Labs    10/12/23 1206  PROT 6.4*  ALBUMIN 3.3*  AST 19  ALT 12  ALKPHOS 121  BILITOT 1.0   PT/INR Recent Labs    10/13/23 0103  LABPROT 15.5*  INR 1.2    Studies/Results: CT ABDOMEN PELVIS W CONTRAST  Result Date: 10/12/2023 CLINICAL DATA:  Inpatient. Abdominal pain, acute, nonlocalized. Rectal and cervical cancer. * Tracking Code: BO * EXAM: CT ABDOMEN AND PELVIS WITH CONTRAST  TECHNIQUE: Multidetector CT imaging of the abdomen and pelvis was performed using the standard protocol following bolus administration of intravenous contrast. RADIATION DOSE REDUCTION: This exam was performed according to the departmental dose-optimization program which includes automated exposure control, adjustment of the mA and/or kV according to patient size and/or use of iterative reconstruction technique. CONTRAST:  OMNIPAQUE IOHEXOL 300 MG/ML  SOLN COMPARISON:  01/21/2022 CT chest, abdomen and pelvis. 04/22/2023 CT pelvis. FINDINGS: Lower chest: Tip of superior approach central venous catheter is seen at the cavoatrial junction. Hepatobiliary: Normal liver size. No liver mass. Moderately distended gallbladder. No radiopaque cholelithiasis. No gallbladder wall thickening. No biliary ductal dilatation. Pancreas: Normal, with no mass or duct dilation. Spleen: Normal size. No mass. Adrenals/Urinary Tract: Normal adrenals. Moderate asymmetric right renal atrophy with multifocal renal cortical scarring in the right kidney, unchanged. No hydronephrosis. No renal masses. Bladder obscured by streak artifact from left hip hardware. Collapsed bladder with suggestion of mild diffuse bladder wall thickening. Stomach/Bowel: Normal non-distended stomach. Normal caliber small bowel with no small bowel wall thickening. Appendectomy. Diverting descending colon loop colostomy in the ventral left abdominal wall. Suggestion of a 5.4 x 3.4 cm irregular annular rectal mass, poorly visualized due to streak artifact from left hip hardware (series 2/image 70). Otherwise no large bowel wall  thickening or significant diverticulosis. Vascular/Lymphatic: Atherosclerotic nonaneurysmal abdominal aorta. Patent portal, splenic, hepatic and renal veins. Mildly enlarged 1.1 cm right inguinal node (series 2/image 76), unchanged from 04/22/2023 CT. No new pathologically enlarged abdominopelvic nodes. Reproductive: Apparent hysterectomy.   No adnexal masses. Other: Small volume ascites, predominantly right pericolic gutter and pelvic. No pneumoperitoneum. No focal fluid collection. Musculoskeletal: No aggressive appearing focal osseous lesions. Mild thoracolumbar spondylosis. Left total hip arthroplasty. IMPRESSION: 1. No evidence of bowel obstruction or acute bowel inflammation. 2. Suggestion of a 5.4 x 3.4 cm irregular annular rectal mass, poorly visualized due to streak artifact from left hip hardware. Viable rectal tumor not excluded. 3. Small volume ascites, predominantly right paracolic gutter and pelvic. 4. Mildly enlarged right inguinal node, unchanged from 04/22/2023 CT. No new pathologically enlarged abdominopelvic nodes. 5. Collapsed bladder with suggestion of mild diffuse bladder wall thickening, nonspecific, cannot exclude cystitis. Suggest correlation with urinalysis. No hydronephrosis. 6. Moderately distended gallbladder, nonspecific. No radiopaque cholelithiasis. No gallbladder wall thickening. No biliary ductal dilatation. 7. Chronic moderate asymmetric right renal atrophy. 8.  Aortic Atherosclerosis (ICD10-I70.0). Electronically Signed   By: Delbert Phenix M.D.   On: 10/12/2023 19:40       Assessment / Plan:   Assessment: 1.  Acute on chronic anemia: Hemoglobin 10.3--> 9.0 overnight, no further hematemesis, but recent admission in October multiple duodenal ulcers thought to be the cause of recent bleeding, patient's aspirin has been held since that admission she is continued on Plavix with her last dose on 11/24, did have increased epigastric pain overnight; consider ongoing ulcer disease versus other 2.  History of rectal adenocarcinoma status post colectomy with ostomy in place: underwent chemotherapy in stool, stool is brown in color 3.  Type 2 diabetes 4.  History of CVA: On Plavix, last dose 11/24  Plan: 1.  Patient scheduled for an EGD today EGD +/- capsule placement. 2.  Continue to monitor hemoglobin and  transfusion as needed less than 7 3.  Continue n.p.o. until after time procedure 4.  Continue Pantoprazole 40 mg IV twice daily  Thank you for your kind consultation, further recommendations after procedure today.   LOS: 0 days   Unk Lightning  10/13/2023, 9:31 AM

## 2023-10-13 NOTE — Anesthesia Postprocedure Evaluation (Signed)
Anesthesia Post Note  Patient: [name]  Procedure(s) Performed: ENTEROSCOPY HOT HEMOSTASIS (ARGON PLASMA COAGULATION/BICAP) BIOPSY     Patient location during evaluation: PACU Anesthesia Type: MAC Level of consciousness: awake and alert Pain management: pain level controlled Vital Signs Assessment: post-procedure vital signs reviewed and stable Respiratory status: spontaneous breathing, nonlabored ventilation and respiratory function stable Cardiovascular status: blood pressure returned to baseline Postop Assessment: no apparent nausea or vomiting Anesthetic complications: no   No notable events documented.  Last Vitals:  Vitals:   10/13/23 1400 10/13/23 1529  BP: (!) 149/42 (!) 140/47  Pulse: 87 89  Resp: (!) 21 (!) 21  Temp: 36.7 C 36.5 C  SpO2: 92% 100%    Last Pain:  Vitals:   10/13/23 1529  TempSrc: Temporal  PainSc: 0-No pain                 Shanda Howells

## 2023-10-13 NOTE — Anesthesia Preprocedure Evaluation (Addendum)
Anesthesia Evaluation  Patient identified by MRN, date of birth, ID band Patient awake    Reviewed: Allergy & Precautions, NPO status , Patient's Chart, lab work & pertinent test results  History of Anesthesia Complications Negative for: history of anesthetic complications  Airway Mallampati: II  TM Distance: >3 FB Neck ROM: Full    Dental  (+) Edentulous Lower, Edentulous Upper   Pulmonary former smoker   Pulmonary exam normal        Cardiovascular hypertension, + CAD, + Peripheral Vascular Disease and +CHF  Normal cardiovascular exam     Neuro/Psych CVA    GI/Hepatic Neg liver ROS, PUD,,,Upper GI bleed   Endo/Other  diabetes, Type 2, Oral Hypoglycemic AgentsHypothyroidism    Renal/GU negative Renal ROS  negative genitourinary   Musculoskeletal  (+) Arthritis ,  narcotic dependent  Abdominal   Peds  Hematology  (+) Blood dyscrasia (Hgb 10.0), anemia   Anesthesia Other Findings Day of surgery medications reviewed with patient.  Reproductive/Obstetrics negative OB ROS                             Anesthesia Physical Anesthesia Plan  ASA: 2  Anesthesia Plan: MAC   Post-op Pain Management: Minimal or no pain anticipated   Induction:   PONV Risk Score and Plan: 2 and Treatment may vary due to age or medical condition and Propofol infusion  Airway Management Planned: Natural Airway and Nasal Cannula  Additional Equipment: None  Intra-op Plan:   Post-operative Plan:   Informed Consent: I have reviewed the patients History and Physical, chart, labs and discussed the procedure including the risks, benefits and alternatives for the proposed anesthesia with the patient or authorized representative who has indicated his/her understanding and acceptance.       Plan Discussed with: CRNA  Anesthesia Plan Comments:        Anesthesia Quick Evaluation

## 2023-10-13 NOTE — Op Note (Addendum)
[facility] Patient Name: [name] Procedure Date: 10/13/2023 MRN: [id number] Attending MD: Particia Lather , , [id number] Date of Birth: 10/16/1946 CSN: [id number] Age: 77 Admit Type: Inpatient Procedure:                Small bowel enteroscopy Indications:              Unexplained epigastric abdominal distress/pain,                            Recurrent gastrointestinal bleeding, coffee ground                            emesis Providers:                Madelyn Brunner" Milinda Hirschfeld                            [place], Technician Referring MD:             Hospitalist team Medicines:                Monitored Anesthesia Care Complications:            No immediate complications. Estimated Blood Loss:     Estimated blood loss was minimal. Procedure:                Pre-Anesthesia Assessment:                           - Prior to the procedure, a History and Physical                            was performed, and patient medications and                            allergies were reviewed. The patient's tolerance of                            previous anesthesia was also reviewed. The risks                            and benefits of the procedure and the sedation                            options and risks were discussed with the patient.                            All questions were answered, and informed consent                            was obtained. Prior Anticoagulants: The patient has                            taken Plavix (clopidogrel), last dose was 2 days                            prior to procedure. ASA  Grade Assessment: III - A                            patient with severe systemic disease. After                            reviewing the risks and benefits, the patient was                            deemed in satisfactory condition to undergo the                            procedure.                           After obtaining informed  consent, the endoscope was                            passed under direct vision. Throughout the                            procedure, the patient's blood pressure, pulse, and                            oxygen saturations were monitored continuously. The                            [id number]-HQ190L ([id number]) Olympus colonoscope was                            introduced through the mouth, and advanced to the                            fourth part of duodenum. Scope In: Scope Out: Findings:      A non-obstructing Schatzki ring was found at the gastroesophageal       junction.      Localized inflammation characterized by congestion (edema) and erythema       was found in the gastric body and in the gastric antrum. Biopsies were       taken with a cold forceps for histology.      A benign-appearing, intrinsic stenosis that was traversed was found in       the second portion of the duodenum.      A single angioectasia with no bleeding was found in the second portion       of the duodenum. Coagulation for bleeding prevention using argon plasma       at 1 liter/minute and 20 watts was successful. Impression:               - Non-obstructing Schatzki ring.                           - Gastritis. Biopsied.                           - Duodenal stenosis.                           -  A single non-bleeding angioectasia in the                            duodenum. Treated with argon plasma coagulation                            (APC). Recommendation:           - Return patient to hospital ward for ongoing care.                           - Patient's coffee ground emesis may be related to                            gastritis.                           - Await pathology results.                           - Continue PPI BID for 10 weeks, then QD                           - Sucralfate suspension 1 g QID for 4 weeks.                           - Placed patient on a soft diet.                           - Okay to  restart Plavix tomorrow.                           - Will plan for HIDA scan for further evaluation of                            distended gallbladder and due to epigastric ab pain.                           - Patient may need evaluation of rectal mass in the                            setting or rectal cancer. Discussed with radiology                            Dr. Chales Abrahams and he does think that this seems more                            significant than prior imaging. He recommended                            rectal EUS vs. CT with rectal contrast vs. PET/CT                            for further evaluation (Dr. Chales Abrahams with radiology  did not think MRI would feasible due to proximity                            of her prosthesis). Discussed with Dr. Truett Perna and                            apparently patient has refused all further                            treatment for her rectal cancer due to intolerance                            of chemotherapy.                           - The findings and recommendations were discussed                            with the patient. Procedure Code(s):        --- Professional ---                           [phone], 59, Small intestinal endoscopy, enteroscopy                            beyond second portion of duodenum, not including                            ileum; with control of bleeding (eg, injection,                            bipolar cautery, unipolar cautery, laser, heater                            probe, stapler, plasma coagulator)                           [id number], Small intestinal endoscopy, enteroscopy                            beyond second portion of duodenum, not including                            ileum; with biopsy, single or multiple Diagnosis Code(s):        --- Professional ---                           K22.2, Esophageal obstruction                           K29.70, Gastritis, unspecified, without bleeding                            K31.5, Obstruction of duodenum  K31.819, Angiodysplasia of stomach and duodenum                            without bleeding                           R10.13, Epigastric pain                           K92.2, Gastrointestinal hemorrhage, unspecified CPT copyright 2022 American Medical Association. All rights reserved. The codes documented in this report are preliminary and upon coder review may  be revised to meet current compliance requirements. Dr Particia Lather "Alan Ripper" Leonides Schanz,  10/13/2023 3:43:46 PM Number of Addenda: 0

## 2023-10-13 NOTE — Progress Notes (Signed)
       Overnight   NAME: [name] MRN: [id number] DOB : June 24, 1946    Date of Service   10/13/2023   HPI/Events of Note    Notified by Attending for follow-up of CT for concern of small bowel obstruction.  77 year old female medical history significant for rectal/cervical adenocarcinoma with colostomy, insulin-dependent type 2 diabetes, CVA previously on aspirin and Plavix, recent upper GI bleed only on Plavix admitted through the ER with recurrent abdominal pain and vomiting. ----------------------------------------------------------------------------------- Imaging (in part, Bold added for emphasis)  "IMPRESSION: 1. No evidence of bowel obstruction or acute bowel inflammation. 2. Suggestion of a 5.4 x 3.4 cm irregular annular rectal mass, poorly visualized due to streak artifact from left hip hardware. Viable rectal tumor not excluded. 3. Small volume ascites, predominantly right paracolic gutter and pelvic. 4. Mildly enlarged right inguinal node, unchanged from 04/22/2023 CT. No new pathologically enlarged abdominopelvic nodes. 5. Collapsed bladder with suggestion of mild diffuse bladder wall thickening, nonspecific, cannot exclude cystitis. Suggest correlation with urinalysis. No hydronephrosis. 6. Moderately distended gallbladder, nonspecific. No radiopaque cholelithiasis. No gallbladder wall thickening. No biliary ductal dilatation. 7. Chronic moderate asymmetric right renal atrophy. 8.  Aortic Atherosclerosis (ICD10-I70.0).     Electronically Signed   By: Delbert Phenix M.D.   On: 10/12/2023 19:40"    Interventions/ Plan   Continue all Attending previous orders.      Chinita Greenland BSN MSNA MSN ACNPC-AG Acute Care Nurse Practitioner [facility]

## 2023-10-13 NOTE — Interval H&P Note (Signed)
History and Physical Interval Note:  10/13/2023 2:10 PM  [name]  has presented today for surgery, with the diagnosis of Anemia, Hematemesis.  The various methods of treatment have been discussed with the patient and family. After consideration of risks, benefits and other options for treatment, the patient has consented to  Procedure(s): ESOPHAGOGASTRODUODENOSCOPY (EGD) WITH PROPOFOL (N/A) GIVENS CAPSULE STUDY (N/A) as a surgical intervention.  The patient's history has been reviewed, patient examined, no change in status, stable for surgery.  I have reviewed the patient's chart and labs.  Questions were answered to the patient's satisfaction.     Imogene Burn

## 2023-10-13 NOTE — Transfer of Care (Signed)
Immediate Anesthesia Transfer of Care Note  Patient: DENESA GONTERMAN  Procedure(s) Performed: ENTEROSCOPY HOT HEMOSTASIS (ARGON PLASMA COAGULATION/BICAP) BIOPSY  Patient Location: PACU and Endoscopy Unit  Anesthesia Type:MAC  Level of Consciousness: drowsy  Airway & Oxygen Therapy: Patient Spontanous Breathing and Patient connected to face mask oxygen  Post-op Assessment: Report given to RN and Post -op Vital signs reviewed and stable  Post vital signs: Reviewed and stable  Last Vitals:  Vitals Value Taken Time  BP 140/47 10/13/23 1529  Temp 36.5 C 10/13/23 1529  Pulse 89 10/13/23 1529  Resp 21 10/13/23 1529  SpO2 100 % 10/13/23 1529    Last Pain:  Vitals:   10/13/23 1529  TempSrc: Temporal  PainSc: 0-No pain      Patients Stated Pain Goal: 3 (10/13/23 1000)  Complications: No notable events documented.

## 2023-10-14 ENCOUNTER — Other Ambulatory Visit: Payer: Self-pay

## 2023-10-14 ENCOUNTER — Encounter: Payer: Self-pay | Admitting: Internal Medicine

## 2023-10-14 ENCOUNTER — Inpatient Hospital Stay (HOSPITAL_COMMUNITY)
Admission: EM | Admit: 2023-10-14 | Discharge: 2023-10-20 | DRG: 689 | Disposition: A | Discharge status: Home Health | Payer: Medicare HMO | Attending: Family Medicine | Admitting: Family Medicine

## 2023-10-14 ENCOUNTER — Observation Stay (HOSPITAL_COMMUNITY): Payer: Medicare HMO

## 2023-10-14 ENCOUNTER — Encounter (HOSPITAL_COMMUNITY): Payer: Self-pay | Admitting: Family Medicine

## 2023-10-14 ENCOUNTER — Emergency Department (HOSPITAL_COMMUNITY): Payer: Medicare HMO

## 2023-10-14 DIAGNOSIS — I110 Hypertensive heart disease with heart failure: Secondary | ICD-10-CM | POA: Diagnosis present

## 2023-10-14 DIAGNOSIS — Z8616 Personal history of COVID-19: Secondary | ICD-10-CM | POA: Diagnosis not present

## 2023-10-14 DIAGNOSIS — D6481 Anemia due to antineoplastic chemotherapy: Secondary | ICD-10-CM | POA: Diagnosis present

## 2023-10-14 DIAGNOSIS — Z933 Colostomy status: Secondary | ICD-10-CM

## 2023-10-14 DIAGNOSIS — E119 Type 2 diabetes mellitus without complications: Secondary | ICD-10-CM

## 2023-10-14 DIAGNOSIS — K920 Hematemesis: Secondary | ICD-10-CM

## 2023-10-14 DIAGNOSIS — K315 Obstruction of duodenum: Secondary | ICD-10-CM | POA: Diagnosis present

## 2023-10-14 DIAGNOSIS — K31819 Angiodysplasia of stomach and duodenum without bleeding: Secondary | ICD-10-CM | POA: Diagnosis present

## 2023-10-14 DIAGNOSIS — G8929 Other chronic pain: Secondary | ICD-10-CM | POA: Diagnosis present

## 2023-10-14 DIAGNOSIS — R112 Nausea with vomiting, unspecified: Secondary | ICD-10-CM

## 2023-10-14 DIAGNOSIS — F32A Depression, unspecified: Secondary | ICD-10-CM | POA: Diagnosis present

## 2023-10-14 DIAGNOSIS — R933 Abnormal findings on diagnostic imaging of other parts of digestive tract: Secondary | ICD-10-CM

## 2023-10-14 DIAGNOSIS — I10 Essential (primary) hypertension: Secondary | ICD-10-CM | POA: Diagnosis present

## 2023-10-14 DIAGNOSIS — Z794 Long term (current) use of insulin: Secondary | ICD-10-CM

## 2023-10-14 DIAGNOSIS — Z9049 Acquired absence of other specified parts of digestive tract: Secondary | ICD-10-CM

## 2023-10-14 DIAGNOSIS — F0393 Unspecified dementia, unspecified severity, with mood disturbance: Secondary | ICD-10-CM | POA: Diagnosis present

## 2023-10-14 DIAGNOSIS — N182 Chronic kidney disease, stage 2 (mild): Secondary | ICD-10-CM | POA: Diagnosis not present

## 2023-10-14 DIAGNOSIS — I890 Lymphedema, not elsewhere classified: Secondary | ICD-10-CM | POA: Diagnosis present

## 2023-10-14 DIAGNOSIS — E1122 Type 2 diabetes mellitus with diabetic chronic kidney disease: Secondary | ICD-10-CM | POA: Diagnosis not present

## 2023-10-14 DIAGNOSIS — G9341 Metabolic encephalopathy: Secondary | ICD-10-CM | POA: Diagnosis not present

## 2023-10-14 DIAGNOSIS — K829 Disease of gallbladder, unspecified: Secondary | ICD-10-CM

## 2023-10-14 DIAGNOSIS — E890 Postprocedural hypothyroidism: Secondary | ICD-10-CM | POA: Diagnosis present

## 2023-10-14 DIAGNOSIS — Z8541 Personal history of malignant neoplasm of cervix uteri: Secondary | ICD-10-CM

## 2023-10-14 DIAGNOSIS — Z90710 Acquired absence of both cervix and uterus: Secondary | ICD-10-CM

## 2023-10-14 DIAGNOSIS — K922 Gastrointestinal hemorrhage, unspecified: Secondary | ICD-10-CM | POA: Diagnosis present

## 2023-10-14 DIAGNOSIS — Z1619 Resistance to other specified beta lactam antibiotics: Secondary | ICD-10-CM | POA: Diagnosis present

## 2023-10-14 DIAGNOSIS — Z96642 Presence of left artificial hip joint: Secondary | ICD-10-CM | POA: Diagnosis present

## 2023-10-14 DIAGNOSIS — Z79890 Hormone replacement therapy (postmenopausal): Secondary | ICD-10-CM

## 2023-10-14 DIAGNOSIS — K2970 Gastritis, unspecified, without bleeding: Secondary | ICD-10-CM | POA: Diagnosis present

## 2023-10-14 DIAGNOSIS — Z1624 Resistance to multiple antibiotics: Secondary | ICD-10-CM | POA: Diagnosis present

## 2023-10-14 DIAGNOSIS — Z8673 Personal history of transient ischemic attack (TIA), and cerebral infarction without residual deficits: Secondary | ICD-10-CM | POA: Diagnosis not present

## 2023-10-14 DIAGNOSIS — R509 Fever, unspecified: Secondary | ICD-10-CM

## 2023-10-14 DIAGNOSIS — E1165 Type 2 diabetes mellitus with hyperglycemia: Secondary | ICD-10-CM | POA: Diagnosis present

## 2023-10-14 DIAGNOSIS — Z7984 Long term (current) use of oral hypoglycemic drugs: Secondary | ICD-10-CM

## 2023-10-14 DIAGNOSIS — D509 Iron deficiency anemia, unspecified: Secondary | ICD-10-CM | POA: Diagnosis present

## 2023-10-14 DIAGNOSIS — Z79891 Long term (current) use of opiate analgesic: Secondary | ICD-10-CM

## 2023-10-14 DIAGNOSIS — D638 Anemia in other chronic diseases classified elsewhere: Secondary | ICD-10-CM | POA: Diagnosis not present

## 2023-10-14 DIAGNOSIS — J42 Unspecified chronic bronchitis: Secondary | ICD-10-CM | POA: Diagnosis present

## 2023-10-14 DIAGNOSIS — Z8701 Personal history of pneumonia (recurrent): Secondary | ICD-10-CM

## 2023-10-14 DIAGNOSIS — E039 Hypothyroidism, unspecified: Secondary | ICD-10-CM | POA: Diagnosis not present

## 2023-10-14 DIAGNOSIS — Y842 Radiological procedure and radiotherapy as the cause of abnormal reaction of the patient, or of later complication, without mention of misadventure at the time of the procedure: Secondary | ICD-10-CM | POA: Diagnosis present

## 2023-10-14 DIAGNOSIS — K222 Esophageal obstruction: Secondary | ICD-10-CM | POA: Diagnosis present

## 2023-10-14 DIAGNOSIS — B961 Klebsiella pneumoniae [K. pneumoniae] as the cause of diseases classified elsewhere: Secondary | ICD-10-CM | POA: Diagnosis present

## 2023-10-14 DIAGNOSIS — E871 Hypo-osmolality and hyponatremia: Secondary | ICD-10-CM | POA: Diagnosis present

## 2023-10-14 DIAGNOSIS — E1140 Type 2 diabetes mellitus with diabetic neuropathy, unspecified: Secondary | ICD-10-CM | POA: Diagnosis present

## 2023-10-14 DIAGNOSIS — Z85048 Personal history of other malignant neoplasm of rectum, rectosigmoid junction, and anus: Secondary | ICD-10-CM

## 2023-10-14 DIAGNOSIS — R252 Cramp and spasm: Secondary | ICD-10-CM | POA: Diagnosis present

## 2023-10-14 DIAGNOSIS — M1990 Unspecified osteoarthritis, unspecified site: Secondary | ICD-10-CM | POA: Diagnosis present

## 2023-10-14 DIAGNOSIS — C20 Malignant neoplasm of rectum: Secondary | ICD-10-CM | POA: Diagnosis present

## 2023-10-14 DIAGNOSIS — R1013 Epigastric pain: Secondary | ICD-10-CM | POA: Diagnosis not present

## 2023-10-14 DIAGNOSIS — Z1152 Encounter for screening for COVID-19: Secondary | ICD-10-CM | POA: Diagnosis not present

## 2023-10-14 DIAGNOSIS — N390 Urinary tract infection, site not specified: Secondary | ICD-10-CM | POA: Diagnosis present

## 2023-10-14 DIAGNOSIS — Z87891 Personal history of nicotine dependence: Secondary | ICD-10-CM

## 2023-10-14 DIAGNOSIS — Z79899 Other long term (current) drug therapy: Secondary | ICD-10-CM

## 2023-10-14 DIAGNOSIS — I2510 Atherosclerotic heart disease of native coronary artery without angina pectoris: Secondary | ICD-10-CM | POA: Diagnosis present

## 2023-10-14 DIAGNOSIS — K2950 Unspecified chronic gastritis without bleeding: Secondary | ICD-10-CM | POA: Diagnosis present

## 2023-10-14 DIAGNOSIS — Z87442 Personal history of urinary calculi: Secondary | ICD-10-CM

## 2023-10-14 DIAGNOSIS — Z7902 Long term (current) use of antithrombotics/antiplatelets: Secondary | ICD-10-CM

## 2023-10-14 DIAGNOSIS — L03115 Cellulitis of right lower limb: Secondary | ICD-10-CM | POA: Diagnosis present

## 2023-10-14 DIAGNOSIS — B49 Unspecified mycosis: Secondary | ICD-10-CM | POA: Diagnosis present

## 2023-10-14 DIAGNOSIS — E1151 Type 2 diabetes mellitus with diabetic peripheral angiopathy without gangrene: Secondary | ICD-10-CM | POA: Diagnosis present

## 2023-10-14 DIAGNOSIS — T451X5A Adverse effect of antineoplastic and immunosuppressive drugs, initial encounter: Secondary | ICD-10-CM | POA: Diagnosis present

## 2023-10-14 DIAGNOSIS — Z833 Family history of diabetes mellitus: Secondary | ICD-10-CM

## 2023-10-14 DIAGNOSIS — D630 Anemia in neoplastic disease: Secondary | ICD-10-CM | POA: Diagnosis present

## 2023-10-14 DIAGNOSIS — E785 Hyperlipidemia, unspecified: Secondary | ICD-10-CM | POA: Diagnosis present

## 2023-10-14 DIAGNOSIS — R1084 Generalized abdominal pain: Principal | ICD-10-CM

## 2023-10-14 DIAGNOSIS — E876 Hypokalemia: Secondary | ICD-10-CM | POA: Diagnosis present

## 2023-10-14 DIAGNOSIS — Z825 Family history of asthma and other chronic lower respiratory diseases: Secondary | ICD-10-CM

## 2023-10-14 DIAGNOSIS — I5032 Chronic diastolic (congestive) heart failure: Secondary | ICD-10-CM | POA: Diagnosis present

## 2023-10-14 DIAGNOSIS — Z9221 Personal history of antineoplastic chemotherapy: Secondary | ICD-10-CM

## 2023-10-14 DIAGNOSIS — R32 Unspecified urinary incontinence: Secondary | ICD-10-CM | POA: Diagnosis present

## 2023-10-14 DIAGNOSIS — K267 Chronic duodenal ulcer without hemorrhage or perforation: Secondary | ICD-10-CM | POA: Diagnosis present

## 2023-10-14 DIAGNOSIS — Z923 Personal history of irradiation: Secondary | ICD-10-CM

## 2023-10-14 DIAGNOSIS — K279 Peptic ulcer, site unspecified, unspecified as acute or chronic, without hemorrhage or perforation: Secondary | ICD-10-CM | POA: Diagnosis not present

## 2023-10-14 DIAGNOSIS — R1010 Upper abdominal pain, unspecified: Secondary | ICD-10-CM

## 2023-10-14 DIAGNOSIS — K208 Other esophagitis without bleeding: Secondary | ICD-10-CM | POA: Diagnosis present

## 2023-10-14 DIAGNOSIS — K2100 Gastro-esophageal reflux disease with esophagitis, without bleeding: Secondary | ICD-10-CM | POA: Diagnosis present

## 2023-10-14 DIAGNOSIS — Z808 Family history of malignant neoplasm of other organs or systems: Secondary | ICD-10-CM

## 2023-10-14 DIAGNOSIS — T50995A Adverse effect of other drugs, medicaments and biological substances, initial encounter: Secondary | ICD-10-CM | POA: Diagnosis present

## 2023-10-14 DIAGNOSIS — K2960 Other gastritis without bleeding: Secondary | ICD-10-CM | POA: Diagnosis present

## 2023-10-14 DIAGNOSIS — K828 Other specified diseases of gallbladder: Secondary | ICD-10-CM | POA: Diagnosis present

## 2023-10-14 DIAGNOSIS — R21 Rash and other nonspecific skin eruption: Secondary | ICD-10-CM

## 2023-10-14 LAB — URINALYSIS, W/ REFLEX TO CULTURE (INFECTION SUSPECTED)
Bilirubin Urine: NEGATIVE
Glucose, UA: NEGATIVE mg/dL
Ketones, ur: NEGATIVE mg/dL
Nitrite: POSITIVE — AB
Protein, ur: 100 mg/dL — AB
RBC / HPF: >50 RBC/hpf (ref 0–5)
Specific Gravity, Urine: 1.02 (ref 1.005–1.030)
WBC, UA: >50 WBC/hpf (ref 0–5)
pH: 5 (ref 5.0–8.0)

## 2023-10-14 LAB — COMPREHENSIVE METABOLIC PANEL
ALT: 9 U/L (ref 0–44)
ALT: 8 U/L (ref 0–44)
AST: 13 U/L — ABNORMAL LOW (ref 15–41)
AST: 14 U/L — ABNORMAL LOW (ref 15–41)
Albumin: 2.5 g/dL — ABNORMAL LOW (ref 3.5–5.0)
Albumin: 2.9 g/dL — ABNORMAL LOW (ref 3.5–5.0)
Alkaline Phosphatase: 71 U/L (ref 38–126)
Alkaline Phosphatase: 83 U/L (ref 38–126)
Anion gap: 10 (ref 5–15)
Anion gap: 12 (ref 5–15)
BUN: 35 mg/dL — ABNORMAL HIGH (ref 8–23)
BUN: 33 mg/dL — ABNORMAL HIGH (ref 8–23)
CO2: 22 mmol/L (ref 22–32)
CO2: 18 mmol/L — ABNORMAL LOW (ref 22–32)
Calcium: 7.9 mg/dL — ABNORMAL LOW (ref 8.9–10.3)
Calcium: 8.3 mg/dL — ABNORMAL LOW (ref 8.9–10.3)
Chloride: 106 mmol/L (ref 98–111)
Chloride: 100 mmol/L (ref 98–111)
Creatinine, Ser: 1.09 mg/dL — ABNORMAL HIGH (ref 0.44–1.00)
Creatinine, Ser: 1.01 mg/dL — ABNORMAL HIGH (ref 0.44–1.00)
GFR, Estimated: 52 mL/min — ABNORMAL LOW (ref >60)
GFR, Estimated: 57 mL/min — ABNORMAL LOW (ref >60)
Glucose, Bld: 201 mg/dL — ABNORMAL HIGH (ref 70–99)
Glucose, Bld: 246 mg/dL — ABNORMAL HIGH (ref 70–99)
Potassium: 3.3 mmol/L — ABNORMAL LOW (ref 3.5–5.1)
Potassium: 4.5 mmol/L (ref 3.5–5.1)
Sodium: 138 mmol/L (ref 135–145)
Sodium: 130 mmol/L — ABNORMAL LOW (ref 135–145)
Total Bilirubin: 0.7 mg/dL (ref <1.2)
Total Bilirubin: 1.3 mg/dL — ABNORMAL HIGH (ref <1.2)
Total Protein: 5.6 g/dL — ABNORMAL LOW (ref 6.5–8.1)
Total Protein: 6.1 g/dL — ABNORMAL LOW (ref 6.5–8.1)

## 2023-10-14 LAB — CBC
HCT: 30 % — ABNORMAL LOW (ref 36.0–46.0)
Hemoglobin: 9.1 g/dL — ABNORMAL LOW (ref 12.0–15.0)
MCH: 27.5 pg (ref 26.0–34.0)
MCHC: 30.3 g/dL (ref 30.0–36.0)
MCV: 90.6 fL (ref 80.0–100.0)
Platelets: 160 10*3/uL (ref 150–400)
RBC: 3.31 MIL/uL — ABNORMAL LOW (ref 3.87–5.11)
RDW: 15.9 % — ABNORMAL HIGH (ref 11.5–15.5)
WBC: 6.5 10*3/uL (ref 4.0–10.5)
nRBC: 0 % (ref 0.0–0.2)

## 2023-10-14 LAB — RESP PANEL BY RT-PCR (RSV, FLU A&B, COVID)  RVPGX2
Influenza A by PCR: NEGATIVE
Influenza B by PCR: NEGATIVE
Resp Syncytial Virus by PCR: NEGATIVE
SARS Coronavirus 2 by RT PCR: NEGATIVE

## 2023-10-14 LAB — PROTIME-INR
INR: 1.4 — ABNORMAL HIGH (ref 0.8–1.2)
Prothrombin Time: 17.1 s — ABNORMAL HIGH (ref 11.4–15.2)

## 2023-10-14 LAB — CBC WITH DIFFERENTIAL/PLATELET
Abs Immature Granulocytes: 0.06 10*3/uL (ref 0.00–0.07)
Basophils Absolute: 0 10*3/uL (ref 0.0–0.1)
Basophils Relative: 0 %
Eosinophils Absolute: 0 10*3/uL (ref 0.0–0.5)
Eosinophils Relative: 0 %
HCT: 35.6 % — ABNORMAL LOW (ref 36.0–46.0)
Hemoglobin: 10.9 g/dL — ABNORMAL LOW (ref 12.0–15.0)
Immature Granulocytes: 1 %
Lymphocytes Relative: 3 %
Lymphs Abs: 0.3 10*3/uL — ABNORMAL LOW (ref 0.7–4.0)
MCH: 27.7 pg (ref 26.0–34.0)
MCHC: 30.6 g/dL (ref 30.0–36.0)
MCV: 90.6 fL (ref 80.0–100.0)
Monocytes Absolute: 0.7 10*3/uL (ref 0.1–1.0)
Monocytes Relative: 5 %
Neutro Abs: 12 10*3/uL — ABNORMAL HIGH (ref 1.7–7.7)
Neutrophils Relative %: 91 %
Platelets: 223 10*3/uL (ref 150–400)
RBC: 3.93 MIL/uL (ref 3.87–5.11)
RDW: 15.8 % — ABNORMAL HIGH (ref 11.5–15.5)
WBC: 13 10*3/uL — ABNORMAL HIGH (ref 4.0–10.5)
nRBC: 0 % (ref 0.0–0.2)

## 2023-10-14 LAB — URINALYSIS, ROUTINE W REFLEX MICROSCOPIC
Bilirubin Urine: NEGATIVE
Glucose, UA: NEGATIVE mg/dL
Ketones, ur: NEGATIVE mg/dL
Nitrite: POSITIVE — AB
Protein, ur: 100 mg/dL — AB
RBC / HPF: >50 RBC/hpf (ref 0–5)
Specific Gravity, Urine: 1.02 (ref 1.005–1.030)
WBC, UA: >50 WBC/hpf (ref 0–5)
pH: 5 (ref 5.0–8.0)

## 2023-10-14 LAB — SURGICAL PATHOLOGY

## 2023-10-14 LAB — I-STAT CG4 LACTIC ACID, ED: Lactic Acid, Venous: 1.5 mmol/L (ref 0.5–1.9)

## 2023-10-14 LAB — GLUCOSE, CAPILLARY
Glucose-Capillary: 172 mg/dL — ABNORMAL HIGH (ref 70–99)
Glucose-Capillary: 132 mg/dL — ABNORMAL HIGH (ref 70–99)

## 2023-10-14 MED ORDER — POTASSIUM CHLORIDE CRYS ER 20 MEQ PO TBCR
40.0000 meq | EXTENDED_RELEASE_TABLET | Freq: Once | ORAL | Status: AC
  Administered 2023-10-14: 40 meq via ORAL
  Filled 2023-10-14 (×2): qty 2

## 2023-10-14 MED ORDER — SODIUM CHLORIDE 0.9 % IV SOLN
2.0000 g | Freq: Two times a day (BID) | INTRAVENOUS | Status: DC
  Administered 2023-10-15 – 2023-10-17 (×5): 2 g via INTRAVENOUS
  Filled 2023-10-14 (×5): qty 12.5

## 2023-10-14 MED ORDER — SODIUM CHLORIDE 0.9 % IV BOLUS
500.0000 mL | Freq: Once | INTRAVENOUS | Status: AC
  Administered 2023-10-14: 500 mL via INTRAVENOUS

## 2023-10-14 MED ORDER — LEVOTHYROXINE SODIUM 25 MCG PO TABS
137.0000 ug | ORAL_TABLET | Freq: Every day | ORAL | Status: DC
  Administered 2023-10-15 – 2023-10-20 (×6): 137 ug via ORAL
  Filled 2023-10-14 (×7): qty 1

## 2023-10-14 MED ORDER — GABAPENTIN 400 MG PO CAPS
600.0000 mg | ORAL_CAPSULE | Freq: Every day | ORAL | Status: DC
  Administered 2023-10-15 – 2023-10-19 (×5): 600 mg via ORAL
  Filled 2023-10-14 (×6): qty 2

## 2023-10-14 MED ORDER — SUCRALFATE 1 GM/10ML PO SUSP: 1.0000 g | Freq: Three times a day (TID) | ORAL | 0 refills | Status: DC

## 2023-10-14 MED ORDER — ACETAMINOPHEN 325 MG PO TABS
650.0000 mg | ORAL_TABLET | Freq: Four times a day (QID) | ORAL | Status: DC | PRN
  Administered 2023-10-15 – 2023-10-18 (×2): 650 mg via ORAL
  Filled 2023-10-14 (×2): qty 2

## 2023-10-14 MED ORDER — HEPARIN SOD (PORK) LOCK FLUSH 100 UNIT/ML IV SOLN
500.0000 [IU] | INTRAVENOUS | Status: AC | PRN
  Administered 2023-10-14: 500 [IU]

## 2023-10-14 MED ORDER — INSULIN ASPART 100 UNIT/ML IJ SOLN
0.0000 [IU] | Freq: Three times a day (TID) | INTRAMUSCULAR | Status: DC
  Administered 2023-10-15 (×2): 1 [IU] via SUBCUTANEOUS
  Administered 2023-10-16: 5 [IU] via SUBCUTANEOUS
  Administered 2023-10-17 (×3): 1 [IU] via SUBCUTANEOUS
  Administered 2023-10-18: 2 [IU] via SUBCUTANEOUS
  Administered 2023-10-18: 1 [IU] via SUBCUTANEOUS
  Administered 2023-10-18 – 2023-10-20 (×5): 2 [IU] via SUBCUTANEOUS
  Administered 2023-10-20: 3 [IU] via SUBCUTANEOUS
  Filled 2023-10-14: qty 0.06

## 2023-10-14 MED ORDER — SODIUM CHLORIDE 0.9 % IV SOLN
1.0000 g | Freq: Once | INTRAVENOUS | Status: AC
Start: 2023-10-14 — End: 2023-10-14
  Administered 2023-10-14: 1 g via INTRAVENOUS
  Filled 2023-10-14: qty 10

## 2023-10-14 MED ORDER — ONDANSETRON HCL 4 MG/2ML IJ SOLN: 4.0000 mg | Freq: Four times a day (QID) | INTRAMUSCULAR | Status: DC | PRN

## 2023-10-14 MED ORDER — PANTOPRAZOLE SODIUM 40 MG PO TBEC
40.0000 mg | DELAYED_RELEASE_TABLET | Freq: Two times a day (BID) | ORAL | Status: DC
  Administered 2023-10-15 – 2023-10-20 (×11): 40 mg via ORAL
  Filled 2023-10-14 (×11): qty 1

## 2023-10-14 MED ORDER — PANTOPRAZOLE SODIUM 40 MG PO TBEC: DELAYED_RELEASE_TABLET | ORAL | 0 refills | Status: DC

## 2023-10-14 MED ORDER — MELATONIN 3 MG PO TABS
3.0000 mg | ORAL_TABLET | Freq: Every evening | ORAL | Status: DC | PRN
  Filled 2023-10-14: qty 1

## 2023-10-14 MED ORDER — TECHNETIUM TC 99M MEBROFENIN IV KIT
5.5000 | PACK | Freq: Once | INTRAVENOUS | Status: AC | PRN
  Administered 2023-10-14: 5.5 via INTRAVENOUS

## 2023-10-14 MED ORDER — MORPHINE SULFATE (PF) 4 MG/ML IV SOLN
4.0000 mg | Freq: Once | INTRAVENOUS | Status: AC
  Administered 2023-10-14: 4 mg via INTRAVENOUS
  Filled 2023-10-14: qty 1

## 2023-10-14 MED ORDER — BACLOFEN 10 MG PO TABS
20.0000 mg | ORAL_TABLET | Freq: Two times a day (BID) | ORAL | Status: DC
  Administered 2023-10-15 – 2023-10-20 (×11): 20 mg via ORAL
  Filled 2023-10-14 (×12): qty 2

## 2023-10-14 MED ORDER — INSULIN ASPART 100 UNIT/ML IJ SOLN
0.0000 [IU] | Freq: Every day | INTRAMUSCULAR | Status: DC
  Administered 2023-10-16: 2 [IU] via SUBCUTANEOUS
  Filled 2023-10-14: qty 0.05

## 2023-10-14 MED ORDER — CLOPIDOGREL BISULFATE 75 MG PO TABS
75.0000 mg | ORAL_TABLET | Freq: Every day | ORAL | Status: DC
  Administered 2023-10-15 – 2023-10-16 (×2): 75 mg via ORAL
  Filled 2023-10-14 (×2): qty 1

## 2023-10-14 MED ORDER — OXYCODONE HCL ER 10 MG PO T12A
10.0000 mg | EXTENDED_RELEASE_TABLET | Freq: Two times a day (BID) | ORAL | Status: DC
  Administered 2023-10-15 – 2023-10-20 (×11): 10 mg via ORAL
  Filled 2023-10-14 (×12): qty 1

## 2023-10-14 MED ORDER — SUCRALFATE 1 GM/10ML PO SUSP
1.0000 g | Freq: Three times a day (TID) | ORAL | Status: DC
Start: 2023-10-15 — End: 2023-10-20
  Administered 2023-10-15 – 2023-10-20 (×21): 1 g via ORAL
  Filled 2023-10-14 (×22): qty 10

## 2023-10-14 MED ORDER — ACETAMINOPHEN 650 MG RE SUPP: 650.0000 mg | Freq: Four times a day (QID) | RECTAL | Status: DC | PRN

## 2023-10-14 MED ORDER — ONDANSETRON HCL 4 MG PO TABS: 4.0000 mg | ORAL_TABLET | Freq: Four times a day (QID) | ORAL | Status: DC | PRN

## 2023-10-14 MED ORDER — ATORVASTATIN CALCIUM 20 MG PO TABS
40.0000 mg | ORAL_TABLET | Freq: Every day | ORAL | Status: DC
  Administered 2023-10-15 – 2023-10-19 (×5): 40 mg via ORAL
  Filled 2023-10-14 (×6): qty 2

## 2023-10-14 MED ORDER — GABAPENTIN 100 MG PO CAPS
300.0000 mg | ORAL_CAPSULE | Freq: Every day | ORAL | Status: DC
  Administered 2023-10-15 – 2023-10-20 (×6): 300 mg via ORAL
  Filled 2023-10-14 (×6): qty 3

## 2023-10-14 MED ORDER — ENOXAPARIN SODIUM 40 MG/0.4ML IJ SOSY
40.0000 mg | PREFILLED_SYRINGE | INTRAMUSCULAR | Status: DC
Start: 2023-10-15 — End: 2023-10-17
  Administered 2023-10-15 – 2023-10-16 (×2): 40 mg via SUBCUTANEOUS
  Filled 2023-10-14 (×2): qty 0.4

## 2023-10-14 MED ORDER — PANTOPRAZOLE SODIUM 40 MG PO TBEC: 40.0000 mg | DELAYED_RELEASE_TABLET | Freq: Every day | ORAL | Status: DC

## 2023-10-14 MED ORDER — SENNOSIDES-DOCUSATE SODIUM 8.6-50 MG PO TABS: 1.0000 | ORAL_TABLET | Freq: Every evening | ORAL | Status: DC | PRN

## 2023-10-14 MED ORDER — SERTRALINE HCL 50 MG PO TABS
50.0000 mg | ORAL_TABLET | Freq: Every day | ORAL | Status: DC
  Administered 2023-10-15 – 2023-10-19 (×5): 50 mg via ORAL
  Filled 2023-10-14 (×6): qty 1

## 2023-10-14 MED ORDER — ONDANSETRON HCL 4 MG/2ML IJ SOLN
4.0000 mg | Freq: Once | INTRAMUSCULAR | Status: AC
  Administered 2023-10-14: 4 mg via INTRAVENOUS
  Filled 2023-10-14: qty 2

## 2023-10-14 MED ORDER — PANTOPRAZOLE SODIUM 40 MG PO TBEC
40.0000 mg | DELAYED_RELEASE_TABLET | Freq: Two times a day (BID) | ORAL | Status: DC
  Administered 2023-10-14: 40 mg via ORAL
  Filled 2023-10-14 (×2): qty 1

## 2023-10-14 NOTE — Plan of Care (Signed)
  Problem: Education: Goal: Knowledge of General Education information will improve Description: Including pain rating scale, medication(s)/side effects and non-pharmacologic comfort measures Outcome: Progressing   Problem: Clinical Measurements: Goal: Will remain free from infection Outcome: Progressing Goal: Cardiovascular complication will be avoided Outcome: Progressing   Problem: Coping: Goal: Level of anxiety will decrease Outcome: Progressing   Problem: Pain Management: Goal: General experience of comfort will improve Outcome: Progressing   Problem: Skin Integrity: Goal: Risk for impaired skin integrity will decrease Outcome: Progressing

## 2023-10-14 NOTE — H&P (Signed)
History and Physical    [name] ZOX:[id number] DOB: 1946-07-11 DOA: 10/14/2023  PCP: Lewis Moccasin, MD   Patient coming from: Home   Chief Complaint: Suprapubic pain, N/V, fatigue   HPI: [name] is a 77 y.o. female with medical history significant for rectal adenocarcinoma status post colectomy, recurrent cervical adenocarcinoma, history of CVA, CAD, type 2 diabetes mellitus, hypothyroidism, chronic pain, chronic anemia, and recent admission with abdominal pain and GI bleeding who returns to the ED with suprapubic pain, nausea, vomiting, and fatigue.  Patient was admitted to the hospital from 10/12/2023 with abdominal pain and GI bleeding, had extensive workup including EGD notable for non-bleeding duodenal ulcers and erosive gastritis.  She was feeling well earlier today and was discharged from the hospital.  Shortly after returning home, she developed nausea, vomiting, and fatigue.  She is also experiencing suprapubic pain.  She was not aware that she was febrile.  ED Course: Upon arrival to the ED, patient is found to be febrile to 38.3 C and saturating well on room air with normal heart rate and stable blood pressure.  Labs are most notable for glucose turn 46, creatinine 1.01, albumin 2.9, WBC 13,000, normal lactic acid, negative respiratory virus panel, and UA with bacteriuria and pyuria.  Blood and urine cultures were collected in the ED and the patient was treated with 500 mL of saline, morphine, Zofran, and Rocephin.  Review of Systems:  All other systems reviewed and apart from HPI, are negative.  Past Medical History:  Diagnosis Date   Arthritis    Basal ganglia stroke [name])    March 2022   Bilateral lower extremity edema    Cancer (HCC)    twice- colon cancer, unsure of the other type of cancer   Carotid artery occlusion    Flank pain    Full dentures    History of cervical cancer    11/ 1994  Stage IB  s/p  high dose radiation brachytherapy @ [facility]  01/ 1995--- per pt no recurrence   History of chronic bronchitis    History of hyperthyroidism    d 03/ 2011---due to grave's disease--- s/p  RAI treatment 04/ 2011   History of kidney stones    History of sepsis 05/03/2018   due to UTI with klebsiella/ pyelonephritis/ ureteral obstruction cause by stone   Hyperlipidemia    Hypothyroidism, postradioiodine therapy    endocrinologist--  dr Everardo All--  dx graves disease and s/p RAI i131 treatement 4/ 2011   Nauseated    Oral thrush 05/03/2018   Pneumonia    Type 2 diabetes mellitus treated with insulin (HCC)    FOLLOWED BY PCP   Urgency of urination    Wears glasses     Past Surgical History:  Procedure Laterality Date   APPENDECTOMY     BIOPSY  01/23/2021   Procedure: BIOPSY;  Surgeon: Napoleon Form, MD;  Location: [facility];  Service: Endoscopy;;   BIOPSY  10/12/2021   Procedure: BIOPSY;  Surgeon: Tressia Danas, MD;  Location: [facility];  Service: Gastroenterology;;   BIOPSY  09/11/2023   Procedure: BIOPSY;  Surgeon: Lynann Bologna, MD;  Location: [facility];  Service: Gastroenterology;;   CAROTID ENDARTERECTOMY Right 03/28/2020   CATARACT EXTRACTION W/ INTRAOCULAR LENS  IMPLANT, BILATERAL  2004  approx.   COLONOSCOPY     COLONOSCOPY WITH PROPOFOL N/A 01/23/2021   Procedure: COLONOSCOPY WITH PROPOFOL;  Surgeon: Napoleon Form, MD;  Location: [facility];  Service: Endoscopy;  Laterality: N/A;   CYSTOSCOPY WITH STENT PLACEMENT Right 05/04/2018   Procedure: CYSTOSCOPY WITH STENT PLACEMENT AND RETROGRADE PYELOGRAM;  Surgeon: Rene Paci, MD;  Location: [facility];  Service: Urology;  Laterality: Right;   CYSTOSCOPY/URETEROSCOPY/HOLMIUM LASER/STENT PLACEMENT Right 05/19/2018   Procedure: CYSTOSCOPY, URETEROSCOPY/HOLMIUM LASER, STONE BASKETRY/ STENT EXCHANGE;  Surgeon: Rene Paci, MD;  Location: [facility];  Service: Urology;  Laterality: Right;   ENDARTERECTOMY Right  03/28/2020   Procedure: RIGHT CAROTID ENDARTERECTOMY with PATCH ANGIOPLASTY;  Surgeon: Larina Earthly, MD;  Location: [facility];  Service: Vascular;  Laterality: Right;   ESOPHAGOGASTRODUODENOSCOPY (EGD) WITH PROPOFOL N/A 10/12/2021   Procedure: ESOPHAGOGASTRODUODENOSCOPY (EGD) WITH PROPOFOL;  Surgeon: Tressia Danas, MD;  Location: [facility];  Service: Gastroenterology;  Laterality: N/A;   ESOPHAGOGASTRODUODENOSCOPY (EGD) WITH PROPOFOL N/A 09/11/2023   Procedure: ESOPHAGOGASTRODUODENOSCOPY (EGD) WITH PROPOFOL;  Surgeon: Lynann Bologna, MD;  Location: [facility];  Service: Gastroenterology;  Laterality: N/A;   EXCISION MASS LEFT CHEST WALL  09-20-2007   dr Janee Morn  [facility]   Radioactive Iodine Therapy     for thyroid   REVISION TOTAL HIP ARTHROPLASTY Left early 2000s   SUBMUCOSAL TATTOO INJECTION  01/23/2021   Procedure: SUBMUCOSAL TATTOO INJECTION;  Surgeon: Napoleon Form, MD;  Location: [facility];  Service: Endoscopy;;   TANDEM RING INSERTION  1995   dr Loree Fee @ [facility]   EUA w/ tandem placement in ovid  (for direct high dose radiation brachytherapy , cervical cancer)   TOTAL HIP ARTHROPLASTY Left 1990s   TRANSTHORACIC ECHOCARDIOGRAM  08/07/2017   mild focal basal hypertrophy of the septum,  ef 60-65%, grade 1 diastolic dysfunction/  atrial septum with lipomatous hypertrophy/  trivial PR   UPPER GASTROINTESTINAL ENDOSCOPY      Social History:   reports that she quit smoking about 41 years ago. Her smoking use included cigarettes. She started smoking about 46 years ago. She has been exposed to tobacco smoke. She has never used smokeless tobacco. She reports that she does not drink alcohol and does not use drugs.  Allergies  Allergen Reactions   Codeine Nausea Only    Family History  Problem Relation Age of Onset   Emphysema Father    Diabetes Father    Emphysema Sister    Emphysema Brother    Cancer Mother        Skin Cancer   Diabetes Mother    Colon cancer Neg Hx     Esophageal cancer Neg Hx    Rectal cancer Neg Hx    Stomach cancer Neg Hx      Prior to Admission medications   Medication Sig Start Date End Date Taking? Authorizing Provider  acetaminophen (TYLENOL) 500 MG tablet Take 500 mg by mouth daily as needed for moderate pain (pain score 4-6) or headache.   Yes [provider]  ascorbic acid (VITAMIN C) 500 MG tablet Take 1 tablet (500 mg total) by mouth daily. 12/03/21  Yes Rolly Salter, MD  atorvastatin (LIPITOR) 40 MG tablet Take 40 mg by mouth at bedtime. 12/15/19  Yes [provider]  baclofen (LIORESAL) 10 MG tablet Take 20 mg by mouth 2 (two) times daily. 03/22/21  Yes [provider]  clopidogrel (PLAVIX) 75 MG tablet Take 1 tablet (75 mg total) by mouth at bedtime. Patient taking differently: Take 75 mg by mouth daily. 10/12/21  Yes Willeen Niece, MD  gabapentin (NEURONTIN) 300 MG capsule Take 300-600 mg by mouth See admin instructions. Take one capsule (  300 mg) by mouth every morning and two capsules (600 mg) at night   Yes [provider]  MELATONIN PO Take 1 tablet by mouth at bedtime.   Yes [provider]  metFORMIN (GLUCOPHAGE) 500 MG tablet Take 500 mg by mouth 2 (two) times daily with a meal. 02/01/21  Yes [provider]  oxyCODONE ER (XTAMPZA ER) 9 MG C12A Take 9 mg by mouth in the morning and at bedtime.   Yes [provider]  pantoprazole (PROTONIX) 40 MG tablet Take 1 tablet (40 mg total) by mouth 2 (two) times daily before a meal for 70 days, THEN 1 tablet (40 mg total) daily. 10/14/23 03/22/24 Yes Pokhrel, Laxman, MD  sertraline (ZOLOFT) 50 MG tablet Take 50 mg by mouth at bedtime.   Yes [provider]  gemfibrozil (LOPID) 600 MG tablet Take 600 mg by mouth 2 (two) times daily.    [provider]  Homeopathic Products (LEG CRAMPS) TABS Take 1 tablet by mouth 2 (two) times daily as needed (leg pain).    [provider]  levothyroxine  (SYNTHROID) 137 MCG tablet Take 137 mcg by mouth daily before breakfast.    [provider]  MAGnesium-Oxide 400 (240 Mg) MG tablet Take 1 tablet by mouth twice daily 08/10/23   Ladene Artist, MD  nystatin (MYCOSTATIN/NYSTOP) powder Apply 1 Application topically 2 (two) times daily. To groin rash Patient taking differently: Apply 1 Application topically 2 (two) times daily as needed (groin irritation, rash). 07/24/23   Ladene Artist, MD  sucralfate (CARAFATE) 1 GM/10ML suspension Take 10 mLs (1 g total) by mouth 4 (four) times daily -  with meals and at bedtime for 28 days. 10/14/23 11/11/23  Joycelyn Das, MD    Physical Exam: Vitals:   10/14/23 2018 10/14/23 2020 10/14/23 2100  BP:  (!) 147/52 (!) 153/54  Pulse:  83 77  Resp:  (!) 25 (!) 21  Temp:  (!) 101 F (38.3 C) 98.6 F (37 C)  TempSrc:   Oral  SpO2:  97% 97%  Weight: 61.7 kg    Height: 5' (1.524 m)      Constitutional: NAD, no diaphoresis   Eyes: PERTLA, lids and conjunctivae normal ENMT: Mucous membranes are moist. Posterior pharynx clear of any exudate or lesions.   Neck: supple, no masses  Respiratory: no wheezing, no crackles. No accessory muscle use.  Cardiovascular: S1 & S2 heard, regular rate and rhythm. No JVD. Abdomen: Soft, no distension, suprapubic tenderness, left-side tender, no rebound pain or guarding. Bowel sounds active.  Musculoskeletal: no clubbing / cyanosis. No joint deformity upper and lower extremities.   Skin: no significant rashes, lesions, ulcers. Warm, dry, well-perfused. Neurologic: CN 2-12 grossly intact. Moving all extremities. Alert and oriented.  Psychiatric: Calm. Cooperative.    Labs and Imaging on Admission: I have personally reviewed following labs and imaging studies  CBC: Recent Labs  Lab 10/12/23 1206 10/13/23 0103 10/13/23 0951 10/13/23 1746 10/14/23 0426 10/14/23 2025  WBC 9.7 5.7  --   --  6.5 13.0*  NEUTROABS  --   --   --   --   --  12.0*  HGB 10.3*  9.0* 10.0* 10.4* 9.1* 10.9*  HCT 34.1* 31.1* 33.2* 34.3* 30.0* 35.6*  MCV 92.2 94.2  --   --  90.6 90.6  PLT 222 196  --   --  160 223   Basic Metabolic Panel: Recent Labs  Lab 10/12/23 1206 10/13/23 0103 10/14/23 0426  10/14/23 2025  NA 137 141 138 130*  K 4.1 3.5 3.3* 4.5  CL 103 107 106 100  CO2 24 22 22  18*  GLUCOSE 138* 81 201* 246*  BUN 22 23 35* 33*  CREATININE 1.05* 0.99 1.09* 1.01*  CALCIUM 8.8* 8.2* 7.9* 8.3*   GFR: Estimated Creatinine Clearance: 38.3 mL/min (A) (by C-G formula based on SCr of 1.01 mg/dL (H)). Liver Function Tests: Recent Labs  Lab 10/12/23 1206 10/14/23 0426 10/14/23 2025  AST 19 13* 14*  ALT 12 9 8   ALKPHOS 121 71 83  BILITOT 1.0 0.7 1.3*  PROT 6.4* 5.6* 6.1*  ALBUMIN 3.3* 2.5* 2.9*   Recent Labs  Lab 10/12/23 1221  LIPASE 27   No results for input(s): "AMMONIA" in the last 168 hours. Coagulation Profile: Recent Labs  Lab 10/13/23 0103 10/14/23 2025  INR 1.2 1.4*   Cardiac Enzymes: No results for input(s): "CKTOTAL", "CKMB", "CKMBINDEX", "TROPONINI" in the last 168 hours. BNP (last 3 results) No results for input(s): "PROBNP" in the last 8760 hours. HbA1C: No results for input(s): "HGBA1C" in the last 72 hours. CBG: Recent Labs  Lab 10/13/23 1134 10/13/23 1611 10/13/23 2025 10/14/23 0736 10/14/23 1122  GLUCAP 94 90 196* 172* 132*   Lipid Profile: No results for input(s): "CHOL", "HDL", "LDLCALC", "TRIG", "CHOLHDL", "LDLDIRECT" in the last 72 hours. Thyroid Function Tests: No results for input(s): "TSH", "T4TOTAL", "FREET4", "T3FREE", "THYROIDAB" in the last 72 hours. Anemia Panel: No results for input(s): "VITAMINB12", "FOLATE", "FERRITIN", "TIBC", "IRON", "RETICCTPCT" in the last 72 hours. Urine analysis:    Component Value Date/Time   COLORURINE AMBER (A) 10/14/2023 2142   APPEARANCEUR CLOUDY (A) 10/14/2023 2142   LABSPEC 1.020 10/14/2023 2142   PHURINE 5.0 10/14/2023 2142   GLUCOSEU NEGATIVE 10/14/2023 2142    HGBUR LARGE (A) 10/14/2023 2142   BILIRUBINUR NEGATIVE 10/14/2023 2142   KETONESUR NEGATIVE 10/14/2023 2142   PROTEINUR 100 (A) 10/14/2023 2142   UROBILINOGEN 0.2 05/27/2014 1203   NITRITE POSITIVE (A) 10/14/2023 2142   LEUKOCYTESUR LARGE (A) 10/14/2023 2142   Sepsis Labs: @LABRCNTIP (procalcitonin:4,lacticidven:4) ) Recent Results (from the past 240 hour(s))  Resp panel by RT-PCR (RSV, Flu A&B, Covid) Anterior Nasal Swab     Status: None   Collection Time: 10/14/23  8:52 PM   Specimen: Anterior Nasal Swab  Result Value Ref Range Status   SARS Coronavirus 2 by RT PCR NEGATIVE NEGATIVE Final    Comment: (NOTE) SARS-CoV-2 target nucleic acids are NOT DETECTED.  The SARS-CoV-2 RNA is generally detectable in upper respiratory specimens during the acute phase of infection. The lowest concentration of SARS-CoV-2 viral copies this assay can detect is 138 copies/mL. A negative result does not preclude SARS-Cov-2 infection and should not be used as the sole basis for treatment or other patient management decisions. A negative result may occur with  improper specimen collection/handling, submission of specimen other than nasopharyngeal swab, presence of viral mutation(s) within the areas targeted by this assay, and inadequate number of viral copies(<138 copies/mL). A negative result must be combined with clinical observations, patient history, and epidemiological information. The expected result is Negative.  Fact Sheet for Patients:  BloggerCourse.com  Fact Sheet for Healthcare Providers:  SeriousBroker.it  This test is no t yet approved or cleared by the [place] FDA and  has been authorized for detection and/or diagnosis of SARS-CoV-2 by FDA under an Emergency Use Authorization (EUA). This EUA will remain  in effect (meaning this test can be used) for the  duration of the COVID-19 declaration under Section 564(b)(1) of the  Act, 21 U.S.C.section 360bbb-3(b)(1), unless the authorization is terminated  or revoked sooner.       Influenza A by PCR NEGATIVE NEGATIVE Final   Influenza B by PCR NEGATIVE NEGATIVE Final    Comment: (NOTE) The Xpert Xpress SARS-CoV-2/FLU/RSV plus assay is intended as an aid in the diagnosis of influenza from Nasopharyngeal swab specimens and should not be used as a sole basis for treatment. Nasal washings and aspirates are unacceptable for Xpert Xpress SARS-CoV-2/FLU/RSV testing.  Fact Sheet for Patients: BloggerCourse.com  Fact Sheet for Healthcare Providers: SeriousBroker.it  This test is not yet approved or cleared by the [place] FDA and has been authorized for detection and/or diagnosis of SARS-CoV-2 by FDA under an Emergency Use Authorization (EUA). This EUA will remain in effect (meaning this test can be used) for the duration of the COVID-19 declaration under Section 564(b)(1) of the Act, 21 U.S.C. section 360bbb-3(b)(1), unless the authorization is terminated or revoked.     Resp Syncytial Virus by PCR NEGATIVE NEGATIVE Final    Comment: (NOTE) Fact Sheet for Patients: BloggerCourse.com  Fact Sheet for Healthcare Providers: SeriousBroker.it  This test is not yet approved or cleared by the [place] FDA and has been authorized for detection and/or diagnosis of SARS-CoV-2 by FDA under an Emergency Use Authorization (EUA). This EUA will remain in effect (meaning this test can be used) for the duration of the COVID-19 declaration under Section 564(b)(1) of the Act, 21 U.S.C. section 360bbb-3(b)(1), unless the authorization is terminated or revoked.  Performed at [facility], [address] [place], [place] [address]      Radiological Exams on Admission: DG Chest Portable 1 View  Result Date: 10/14/2023 CLINICAL DATA:  Fever,  cervical and rectal cancer EXAM: PORTABLE CHEST 1 VIEW COMPARISON:  Radiograph 09/10/2023 FINDINGS: Right chest wall Port-A-Cath tip in the right atrium. Stable cardiomediastinal silhouette. Aortic atherosclerotic calcification. No focal consolidation, pleural effusion, or pneumothorax. No displaced rib fractures. IMPRESSION: No active disease. Electronically Signed   By: Minerva Fester M.D.   On: 10/14/2023 21:15   US Abdomen Limited RUQ (LIVER/GB)  Result Date: 10/14/2023 CLINICAL DATA:  [id number] Abdominal pain [id number] EXAM: ULTRASOUND ABDOMEN LIMITED RIGHT UPPER QUADRANT COMPARISON:  HIDA scan 10/14/2023, CT 10/12/2023 FINDINGS: Gallbladder: Gallbladder is distended measuring 13 cm in length. No significant wall thickening. No sludge or stones within the gallbladder. No sonographic Murphy sign noted by sonographer. Common bile duct: Not visualized. Liver: No focal lesion identified. Within normal limits in parenchymal echogenicity. Portal vein is patent on color Doppler imaging with normal direction of blood flow towards the liver. Other: None. Technical note: Sonographer describes a technically difficult study 2 2 poor penetration related to patient body habitus. Portions of the left hepatic lobe as well as the common bile duct were not able to be assessed. IMPRESSION: 1. Distended gallbladder without sonographic evidence of acute cholecystitis. 2. Nonvisualization of the common bile duct. Electronically Signed   By: Duanne Guess D.O.   On: 10/14/2023 16:02   NM Hepatobiliary Liver Func  Result Date: 10/14/2023 CLINICAL DATA:  Concern for acalculous cholecystitis. Biliary colic. EXAM: NUCLEAR MEDICINE HEPATOBILIARY IMAGING TECHNIQUE: Sequential images of the abdomen were obtained out to 60 minutes following intravenous administration of radiopharmaceutical. RADIOPHARMACEUTICALS:  5.5 mCi Tc-81m  Choletec IV COMPARISON:  CT 10/12/2019 FINDINGS: Prompt clearance radiotracer from blood pool and  homogeneous uptake in liver. Counts are evident in the small  bowel 25 minutes. Small focus of activity is noted in the region the neck of the gallbladder at 50 minutes. This activity continues to expand over 2 Hours of imaging. Favor delayed filling of the gallbladder. IMPRESSION: 1. Patent common bile duct. 2. Delayed filling of the gallbladder. Ejection fraction could not be performed as only partial filling of the gallbladder at 2 hours imaging. Electronically Signed   By: Genevive Bi M.D.   On: 10/14/2023 11:19    EKG: Independently reviewed. Sinus rhythm.   Assessment/Plan   1. UTI  - Presents with new fever, new leukocytosis, suprapubic pain, N/V, and has UA compatible with infection  - Urine has previously grown Pseudomonas and citrobacter resistant to ceftriaxone  - Blood and urine cultures collected in ED and Rocephin administered  - Treat with cefepime for now, follow cultures and clinical course   2. Recent GI bleeding; PUD; gastritis   - No active bleeding, Hgb has increased   - Continue PPI and Carafate, cautiously resume Plavix   3. CAD  - No anginal complaints  - Continue Lipitor and Plavix   4. Type II DM  - A1c was 5.7% last month  - Check CBGs and use low-intensity correctional while in hospital    5. Hx of CVA  - Continue Lipitor and Plavix   6. Hypothyroidism  - Synthroid   7. Chronic pain  - Prescription database reviewed, plan to continue home regimen    8. Hx of rectal and cervical cancers  - Not currently undergoing treatment, follows with Dr. Truett Perna    DVT prophylaxis: Lovenox  Code Status: Full  Level of Care: Level of care: Med-Surg Family Communication: None present  Disposition Plan:  Patient is from: home  Anticipated d/c is to: TBD Anticipated d/c date is: 10/17/23 Patient currently: Pending treatment of febrile UTI Consults called: None  Admission status: Inpatient     Briscoe Deutscher, MD [facility]  10/14/2023, 11:50  PM

## 2023-10-14 NOTE — Progress Notes (Signed)
IP PROGRESS NOTE  Subjective:   [name] is well-known to me with a history of rectal and cervical cancer.  She is currently maintained off of specific treatment for cancer.  She was recently admitted with a GI bleed and found to have duodenal ulcers.  She presented to the emergency room 10/12/2023 with abdominal pain and vomiting.  She was admitted for further evaluation.  CT Abdo/pelvis 10/12/2023 revealed a rectal mass.  The gastroenterology service asked me to see [name]. Gallbladder was moderately distended.  [name] reports no bleeding in the ostomy bag.  She continues to have abdominal pain.  Underwent a small bowel enteroscopy yesterday and was found to have a single nonbleeding angioectasia in the duodenum.  This was treated with argon plasma coagulation. Objective: Vital signs in last 24 hours: Blood pressure (!) 126/44, pulse 82, temperature 98.9 F (37.2 C), temperature source Oral, resp. rate (!) 22, height 5' (1.524 m), weight 136 lb 0.4 oz (61.7 kg), SpO2 96%.  Intake/Output from previous day: 11/26 0701 - 11/27 0700 In: 660 [P.O.:360; I.V.:300] Out: 1050 [Urine:550; Stool:500]  Physical Exam:  HEENT: No thrush Lungs: Clear anteriorly Cardiac: Regular rate and rhythm Abdomen: No hepatosplenomegaly, left lower quadrant colostomy with semiformed brown stool, tender throughout the abdomen Extremities: Trace edema in the right lower leg   Portacath/PICC-without erythema  Lab Results: Recent Labs    10/13/23 0103 10/13/23 0951 10/13/23 1746 10/14/23 0426  WBC 5.7  --   --  6.5  HGB 9.0*   < > 10.4* 9.1*  HCT 31.1*   < > 34.3* 30.0*  PLT 196  --   --  160   < > = values in this interval not displayed.    BMET Recent Labs    10/13/23 0103 10/14/23 0426  NA 141 138  K 3.5 3.3*  CL 107 106  CO2 22 22  GLUCOSE 81 201*  BUN 23 35*  CREATININE 0.99 1.09*  CALCIUM 8.2* 7.9*    Lab Results  Component Value Date   CEA1 28.60 (H) 06/12/2021    CEA 44.01 (H) 02/02/2023    Studies/Results: NM Hepatobiliary Liver Func  Result Date: 10/14/2023 CLINICAL DATA:  Concern for acalculous cholecystitis. Biliary colic. EXAM: NUCLEAR MEDICINE HEPATOBILIARY IMAGING TECHNIQUE: Sequential images of the abdomen were obtained out to 60 minutes following intravenous administration of radiopharmaceutical. RADIOPHARMACEUTICALS:  5.5 mCi Tc-59m  Choletec IV COMPARISON:  CT 10/12/2019 FINDINGS: Prompt clearance radiotracer from blood pool and homogeneous uptake in liver. Counts are evident in the small bowel 25 minutes. Small focus of activity is noted in the region the neck of the gallbladder at 50 minutes. This activity continues to expand over 2 Hours of imaging. Favor delayed filling of the gallbladder. IMPRESSION: 1. Patent common bile duct. 2. Delayed filling of the gallbladder. Ejection fraction could not be performed as only partial filling of the gallbladder at 2 hours imaging. Electronically Signed   By: Genevive Bi M.D.   On: 10/14/2023 11:19   CT ABDOMEN PELVIS W CONTRAST  Result Date: 10/12/2023 CLINICAL DATA:  Inpatient. Abdominal pain, acute, nonlocalized. Rectal and cervical cancer. * Tracking Code: BO * EXAM: CT ABDOMEN AND PELVIS WITH CONTRAST TECHNIQUE: Multidetector CT imaging of the abdomen and pelvis was performed using the standard protocol following bolus administration of intravenous contrast. RADIATION DOSE REDUCTION: This exam was performed according to the departmental dose-optimization program which includes automated exposure control, adjustment of the mA and/or kV according to patient size and/or  use of iterative reconstruction technique. CONTRAST:  OMNIPAQUE IOHEXOL 300 MG/ML  SOLN COMPARISON:  01/21/2022 CT chest, abdomen and pelvis. 04/22/2023 CT pelvis. FINDINGS: Lower chest: Tip of superior approach central venous catheter is seen at the cavoatrial junction. Hepatobiliary: Normal liver size. No liver mass. Moderately  distended gallbladder. No radiopaque cholelithiasis. No gallbladder wall thickening. No biliary ductal dilatation. Pancreas: Normal, with no mass or duct dilation. Spleen: Normal size. No mass. Adrenals/Urinary Tract: Normal adrenals. Moderate asymmetric right renal atrophy with multifocal renal cortical scarring in the right kidney, unchanged. No hydronephrosis. No renal masses. Bladder obscured by streak artifact from left hip hardware. Collapsed bladder with suggestion of mild diffuse bladder wall thickening. Stomach/Bowel: Normal non-distended stomach. Normal caliber small bowel with no small bowel wall thickening. Appendectomy. Diverting descending colon loop colostomy in the ventral left abdominal wall. Suggestion of a 5.4 x 3.4 cm irregular annular rectal mass, poorly visualized due to streak artifact from left hip hardware (series 2/image 70). Otherwise no large bowel wall thickening or significant diverticulosis. Vascular/Lymphatic: Atherosclerotic nonaneurysmal abdominal aorta. Patent portal, splenic, hepatic and renal veins. Mildly enlarged 1.1 cm right inguinal node (series 2/image 76), unchanged from 04/22/2023 CT. No new pathologically enlarged abdominopelvic nodes. Reproductive: Apparent hysterectomy.  No adnexal masses. Other: Small volume ascites, predominantly right pericolic gutter and pelvic. No pneumoperitoneum. No focal fluid collection. Musculoskeletal: No aggressive appearing focal osseous lesions. Mild thoracolumbar spondylosis. Left total hip arthroplasty. IMPRESSION: 1. No evidence of bowel obstruction or acute bowel inflammation. 2. Suggestion of a 5.4 x 3.4 cm irregular annular rectal mass, poorly visualized due to streak artifact from left hip hardware. Viable rectal tumor not excluded. 3. Small volume ascites, predominantly right paracolic gutter and pelvic. 4. Mildly enlarged right inguinal node, unchanged from 04/22/2023 CT. No new pathologically enlarged abdominopelvic nodes. 5.  Collapsed bladder with suggestion of mild diffuse bladder wall thickening, nonspecific, cannot exclude cystitis. Suggest correlation with urinalysis. No hydronephrosis. 6. Moderately distended gallbladder, nonspecific. No radiopaque cholelithiasis. No gallbladder wall thickening. No biliary ductal dilatation. 7. Chronic moderate asymmetric right renal atrophy. 8.  Aortic Atherosclerosis (ICD10-I70.0). Electronically Signed   By: Delbert Phenix M.D.   On: 10/12/2023 19:40    Medications: I have reviewed the patient's current medications.  Assessment/Plan: Rectal cancer Partially obstructing mass beginning at 10 cm from the anal verge on colonoscopy 01/23/2021, biopsy-adenocarcinoma, immunohistochemical stains at [facility]-CK20, CDX-2, and SATB2 positive, patchy strong staining for p16.  The rectal tumor has a distinct immunohistochemical profile suggesting a primary colorectal tumor as opposed to metastatic cervical cancer. CT abdomen/pelvis 01/18/2021-bilateral hydronephrosis, endometrial fluid collection, prominent stool in the colon MRI pelvis 01/23/2021-tumor at 7.6 cm from the anal verge, abnormal signal bridge in the cervix, mesorectum, and anterior aspect of the rectum-similar to MRI from 2020, MRI stage could not be defined secondary to posttreatment changes in the pelvis 03/18/2021-total abdominal hysterectomy, cystoscopy with insertion of ureteral stents, exploratory laparotomy, aborted low anterior resection, descending loop colostomy--right and left fallopian tubes and ovaries negative for malignancy; excision portion of cervix positive for adenocarcinoma; uterus with invasive moderately differentiated adenocarcinoma of the cervix, HPV associated, invading through full-thickness of the cervix/lower uterine segment and into parametrial tissue, margins of resection received disrupted, cannot be evaluated; remaining uterus with extensive endocervical adenocarcinoma in situ involving lower uterine segment and  replacing endometrium.  This carcinoma was felt to be a separate primary from the rectal tumor. PET scan at [facility] 04/11/2021-intense FDG activity in region of known rectal mass.  Hypermetabolic  left external iliac and right inguinal lymph nodes.  Minimal FDG uptake and small right supraclavicular lymph node.  No uptake seen in the cervix. 04/23/2021 FNA right inguinal lymph node-negative for malignancy Evaluated by Dr. Lattie Corns 05/21/2021, 05/28/2021-appears to have 2 synchronous malignancies (cervical adenocarcinoma and rectal adenocarcinoma); further surgery which would entail pelvic exenteration not recommended by gynecologic oncology or colorectal surgery.  Full doses of radiotherapy not felt to be feasible.  Recommended 3 months of CAPOX and then repeat MRI of abdomen/pelvis, chest CT and colonoscopy. Cycle 1 Xeloda 06/13/2021 Cycle 1 FOLFOX 07/08/2021, oxaliplatin dose reduced secondary to pre-existing neuropathy Cycle 2 FOLFOX 07/29/2021, Udenyca Cycle 3 FOLFOX 08/12/2021, Udenyca Cycle 4 FOLFOX 09/02/2021, Udenyca Cycle 5 FOLFOX 09/17/2021, Udenyca CT[facility] 10/01/2021-unchanged findings of rectal malignancy status posttreatment.  Marked thickening of the proximal duodenal wall.  Unchanged bilateral inguinal lymph nodes which are not enlarged but avid on prior PET.  Unchanged diffuse bladder wall thickening and perivesicular stranding which may represent radiation cystitis.  New mild right lower lobe bronchial wall thickening, mucus impaction and tree-in-bud pulmonary nodules. Cycle 6 FOLFOX 10/22/2021, Udenyca CT 01/21/2022-stable soft tissue thickening at the lower rectum, slight increase in adjacent stranding no evidence of metastatic disease in the chest, abdomen, or pelvis, interval diverting loop colostomy CT pelvis and lower extremities 04/22/2023-subcutaneous fat stranding in the lower legs bilaterally on the right greater than left, enlarged right inguinal lymph nodes, total left hip arthroplasty with  stable acetabular bone erosions, ill-defined soft tissue density in the region of the distal rectum with surrounding fat stranding unchanged CT Abdo/pelvis 10/12/2023: Distended gallbladder, irregular rectal mass, stable mildly enlarged right inguinal node   Bilateral hydronephrosis-secondary to urinary retention? Hematometra found on pelvic MRI 01/23/2021, evaluated by gynecologic oncology Cervical cancer November 1994, stage Ib, treated with external beam radiation and brachytherapy at [facility] Diabetes Neuropathy Chronic pain secondary to #6 Recurrent urinary tract infections History of a CVA History of hyperthyroidism G3, P3 COVID-19 infection January 2021 Admission 08/25/2021 with a urinary tract infection-culture negative Anemia secondary to chronic disease, chemotherapy, and rectal bleeding-1 unit packed red blood cells 08/26/2021 Hospital admission 09/23/2021-symptomatic anemia 16.  Admission 10/08/2021 with altered mental status, nausea/vomiting, and left foot ulcers Urine culture 10/08/2021 positive for Pseudomonas aeruginosa EGD 10/12/2021-gastric and duodenal ulcers-not bleeding 17.  Admission 11/29/2021 through 12/02/2021 with COVID-pneumonia 18.  Right lower extremity cellulitis 04/22/2023-Bactrim, repeat course of Bactrim 05/07/2023 19.  Admission 09/10/2023 with severe symptomatic anemia, dark stool-Hemoccult positive 3 units packed red blood cells Upper endoscopy 09/11/2023: Duodenal ulcers with strictures likely source of GI bleeding, no active bleeding. Small bowel enteroscopy 10/13/2023: Single nonbleeding AVM treated with APC  20.  Admission 10/12/2023 with abdominal pain and nausea/vomiting       [name] was admitted 10/12/2023 with abdominal pain, nausea, and a report of hematemesis.  No bleeding was found on endoscopy yesterday.  A CT revealed a distended gallbladder.  Hemoglobin has not changed significantly over the past week.  She was noted to have a rectal mass on  the admission abdomen/pelvis CT.  I suspect she has persistent rectal/cervical adenocarcinoma.  She is not a surgical candidate.  She has received radiation and chemotherapy in the past.  She has declined further chemotherapy.  She has chronic right greater than left leg edema, likely lymphedema secondary to tumor and radiation.  I think it is unlikely the abdominal pain is related to the rectal mass.  Recommendations: Continue management of the duodenal ulcers and GI bleeding  per gastroenterology Evaluate for gallbladder disease per the GI and surgical services Please call oncology as needed.  Outpatient follow-up is scheduled at the Cancer center   LOS: 0 days   Thornton Papas, MD   10/14/2023, 3:29 PM

## 2023-10-14 NOTE — Progress Notes (Signed)
PROGRESS NOTE    [name]  UUV:[id number] DOB: 1946/03/29 DOA: 10/12/2023 PCP: Lewis Moccasin, MD   Brief Narrative:  [name] is a 77 y.o. female with a history of rectal/cervical adenocarcinoma s/p diverting loop ostomy with colostomy, diabetes mellitus type 2, CVA, upper GI bleeding presented to the hospital secondary to abdominal pain, nausea and coffee ground vomiting.  GI was consulted and performed an upper GI endoscopy, small bowel enteroscopy.  Patient persisted to have pain and is awaiting for HIDA scan at this time.  Assessment and Plan:  Abdominal pain Right-sided upper abdominal pain.  CT imaging without obvious etiology,gallbladder distention without cholecystitis noted; no ductal dilation; no LFT elevation.  HIDA scan done with patent common bile duct, delayed filling of the gallbladder.  Ejection fraction could not be performed.  Will follow GI recommendations.  GI Bleeding Patient with a history of multiple duodenal ulcers, complicated by Plavix use. GI consulted and performed an upper GI endoscopy on 11/26 revealing gastritis (biopsied), duodenal stenosis, and a single non-bleeding angioectasia in the duodenum treated with APC.  GI recommends PPI BID x10 weeks then every day, sucralfate suspension 1 g QID x4 weeks, soft diet, restart Plavix on 11/27 if okay with GI.  Mild hypokalemia.  Potassium 3.3 today.  Will replenish with 40 mEq of p.o. potassium.  Check BMP in AM.  Chronic anemia Hemoglobin baseline of about 9-10. Currently stable.  Latest hemoglobin of 9.1.  Diabetes mellitus type 2, well controlled Last hemoglobin A1C of 5.7%. patient is on metformin as an outpatient.  Continue sliding scale insulin while in the hospital.  Chronic pain On Oxycontin, Baclofen, gabapentin  Depression -Continue Zoloft  Hypothyroidism -Continue Synthroid  Hyperlipidemia History of CVA Continue Lipitor.  Plavix is still on hold.   DVT prophylaxis:  SCDs  Code Status:   Code Status: Full Code  Family Communication:  Spoke with the patient's daughter at bedside.  Disposition Plan: Discharge pending ongoing workup for abdominal pain   Consultants:  [facility]  Procedures:   Small bowel enteroscopy 10/13/2023 HIDA scan  Antimicrobials: None    Subjective: Today, patient was seen and examined at bedside.  Seen after HIDA scan.  Complains of mild abdominal discomfort.  Has been having output in colostomy.   Objective: BP (!) 141/49 (BP Location: Left Arm)   Pulse 88   Temp 99 F (37.2 C) (Oral)   Resp 16   Ht 5' (1.524 m)   Wt 61.7 kg   SpO2 99%   BMI 26.57 kg/m   Physical examination:  General:  Average built, not in obvious distress HENT:   No scleral pallor or icterus noted. Oral mucosa is moist.  Chest:  Clear breath sounds.  Diminished breath sounds bilaterally. No crackles or wheezes.  CVS: S1 &S2 heard. No murmur.  Regular rate and rhythm. Abdomen: Soft, tenderness over the abdomen mostly on the right upper side.  No rigidity guarding noted.  Colostomy bag in place. Extremities: No cyanosis, clubbing or edema.  Peripheral pulses are palpable. Psych: Alert, awake and oriented, normal mood CNS:  No cranial nerve deficits.  Power equal in all extremities.   Skin: Warm and dry.  No rashes noted.  Data Reviewed: I have personally reviewed following labs and imaging studies  CBC Lab Results  Component Value Date   WBC 6.5 10/14/2023   RBC 3.31 (L) 10/14/2023   HGB 9.1 (L) 10/14/2023   HCT 30.0 (L) 10/14/2023   MCV 90.6  10/14/2023   MCH 27.5 10/14/2023   PLT 160 10/14/2023   MCHC 30.3 10/14/2023   RDW 15.9 (H) 10/14/2023   LYMPHSABS 0.8 10/06/2023   MONOABS 0.4 10/06/2023   EOSABS 0.1 10/06/2023   BASOSABS 0.0 10/06/2023     Last metabolic panel Lab Results  Component Value Date   NA 138 10/14/2023   K 3.3 (L) 10/14/2023   CL 106 10/14/2023   CO2 22 10/14/2023   BUN 35 (H) 10/14/2023    CREATININE 1.09 (H) 10/14/2023   GLUCOSE 201 (H) 10/14/2023   GFRNONAA 52 (L) 10/14/2023   GFRAA >60 03/29/2020   CALCIUM 7.9 (L) 10/14/2023   PHOS 3.5 09/11/2023   PROT 5.6 (L) 10/14/2023   ALBUMIN 2.5 (L) 10/14/2023   BILITOT 0.7 10/14/2023   ALKPHOS 71 10/14/2023   AST 13 (L) 10/14/2023   ALT 9 10/14/2023   ANIONGAP 10 10/14/2023    GFR: Estimated Creatinine Clearance: 35.5 mL/min (A) (by C-G formula based on SCr of 1.09 mg/dL (H)).  No results found for this or any previous visit (from the past 240 hour(s)).    Radiology Studies: CT ABDOMEN PELVIS W CONTRAST  Result Date: 10/12/2023 CLINICAL DATA:  Inpatient. Abdominal pain, acute, nonlocalized. Rectal and cervical cancer. * Tracking Code: BO * EXAM: CT ABDOMEN AND PELVIS WITH CONTRAST TECHNIQUE: Multidetector CT imaging of the abdomen and pelvis was performed using the standard protocol following bolus administration of intravenous contrast. RADIATION DOSE REDUCTION: This exam was performed according to the departmental dose-optimization program which includes automated exposure control, adjustment of the mA and/or kV according to patient size and/or use of iterative reconstruction technique. CONTRAST:  OMNIPAQUE IOHEXOL 300 MG/ML  SOLN COMPARISON:  01/21/2022 CT chest, abdomen and pelvis. 04/22/2023 CT pelvis. FINDINGS: Lower chest: Tip of superior approach central venous catheter is seen at the cavoatrial junction. Hepatobiliary: Normal liver size. No liver mass. Moderately distended gallbladder. No radiopaque cholelithiasis. No gallbladder wall thickening. No biliary ductal dilatation. Pancreas: Normal, with no mass or duct dilation. Spleen: Normal size. No mass. Adrenals/Urinary Tract: Normal adrenals. Moderate asymmetric right renal atrophy with multifocal renal cortical scarring in the right kidney, unchanged. No hydronephrosis. No renal masses. Bladder obscured by streak artifact from left hip hardware. Collapsed bladder  with suggestion of mild diffuse bladder wall thickening. Stomach/Bowel: Normal non-distended stomach. Normal caliber small bowel with no small bowel wall thickening. Appendectomy. Diverting descending colon loop colostomy in the ventral left abdominal wall. Suggestion of a 5.4 x 3.4 cm irregular annular rectal mass, poorly visualized due to streak artifact from left hip hardware (series 2/image 70). Otherwise no large bowel wall thickening or significant diverticulosis. Vascular/Lymphatic: Atherosclerotic nonaneurysmal abdominal aorta. Patent portal, splenic, hepatic and renal veins. Mildly enlarged 1.1 cm right inguinal node (series 2/image 76), unchanged from 04/22/2023 CT. No new pathologically enlarged abdominopelvic nodes. Reproductive: Apparent hysterectomy.  No adnexal masses. Other: Small volume ascites, predominantly right pericolic gutter and pelvic. No pneumoperitoneum. No focal fluid collection. Musculoskeletal: No aggressive appearing focal osseous lesions. Mild thoracolumbar spondylosis. Left total hip arthroplasty. IMPRESSION: 1. No evidence of bowel obstruction or acute bowel inflammation. 2. Suggestion of a 5.4 x 3.4 cm irregular annular rectal mass, poorly visualized due to streak artifact from left hip hardware. Viable rectal tumor not excluded. 3. Small volume ascites, predominantly right paracolic gutter and pelvic. 4. Mildly enlarged right inguinal node, unchanged from 04/22/2023 CT. No new pathologically enlarged abdominopelvic nodes. 5. Collapsed bladder with suggestion of mild diffuse bladder wall thickening,  nonspecific, cannot exclude cystitis. Suggest correlation with urinalysis. No hydronephrosis. 6. Moderately distended gallbladder, nonspecific. No radiopaque cholelithiasis. No gallbladder wall thickening. No biliary ductal dilatation. 7. Chronic moderate asymmetric right renal atrophy. 8.  Aortic Atherosclerosis (ICD10-I70.0). Electronically Signed   By: Delbert Phenix M.D.   On:  10/12/2023 19:40      LOS: 0 days    Joycelyn Das, MD [facility] 10/14/2023, 7:16 AM   If 7PM-7AM, please contact night-coverage [email or web address]

## 2023-10-14 NOTE — ED Provider Notes (Signed)
Emergency Department Provider Note   I have reviewed the triage vital signs and the nursing notes.   HISTORY  Chief Complaint Weakness   HPI [name] is a 77 y.o. female with past 3 reviewed below returns to the emergency department with abdominal pain, vomiting.  She was discharged in the hospital earlier today after admission for vomiting with upper GI bleeding.  She underwent upper endoscopy, HIDA scan, right upper quadrant ultrasound showing a distended gallbladder but no acute cholecystitis.  She states that she continues to have pain.  She arrives with fever but was unaware of the symptoms.  She is incontinent at baseline.    Past Medical History:  Diagnosis Date   Arthritis    Basal ganglia stroke [facility])    March 2022   Bilateral lower extremity edema    Cancer (HCC)    twice- colon cancer, unsure of the other type of cancer   Carotid artery occlusion    Flank pain    Full dentures    History of cervical cancer    11/ 1994  Stage IB  s/p  high dose radiation brachytherapy @ [facility] 01/ 1995--- per pt no recurrence   History of chronic bronchitis    History of hyperthyroidism    d 03/ 2011---due to grave's disease--- s/p  RAI treatment 04/ 2011   History of kidney stones    History of sepsis 05/03/2018   due to UTI with klebsiella/ pyelonephritis/ ureteral obstruction cause by stone   Hyperlipidemia    Hypothyroidism, postradioiodine therapy    endocrinologist--  dr Everardo All--  dx graves disease and s/p RAI i131 treatement 4/ 2011   Nauseated    Oral thrush 05/03/2018   Pneumonia    Type 2 diabetes mellitus treated with insulin (HCC)    FOLLOWED BY PCP   Urgency of urination    Wears glasses     Review of Systems  Constitutional: Positive fever/chills Cardiovascular: Denies chest pain. Respiratory: Denies shortness of breath. Gastrointestinal: Positive abdominal pain. Positive nausea and vomiting.  No diarrhea.  No constipation. Genitourinary:  Negative for dysuria. Musculoskeletal: Negative for back pain. Skin: Negative for rash. Neurological: Negative for headaches.  ____________________________________________   PHYSICAL EXAM:  VITAL SIGNS: ED Triage Vitals  Encounter Vitals Group     BP 10/14/23 2020 (!) 147/52     Pulse Rate 10/14/23 2020 83     Resp 10/14/23 2020 (!) 25     Temp 10/14/23 2020 (!) 101 F (38.3 C)     Temp src --      SpO2 10/14/23 2020 97 %     Weight 10/14/23 2018 136 lb 0.4 oz (61.7 kg)     Height 10/14/23 2018 5' (1.524 m)   Constitutional: Alert and oriented. Well appearing and in no acute distress. Eyes: Conjunctivae are normal.  Head: Atraumatic. Nose: No congestion/rhinnorhea. Mouth/Throat: Mucous membranes are moist.  Oropharynx non-erythematous. Neck: No stridor.   Cardiovascular: Normal rate, regular rhythm. Good peripheral circulation. Grossly normal heart sounds.   Respiratory: Normal respiratory effort.  No retractions. Lungs CTAB. Gastrointestinal: Soft with mild diffuse epigastric tenderness. No distention.  Musculoskeletal: No gross deformities of extremities. Neurologic:  Normal speech and language.  Skin:  Skin is warm, dry and intact. No rash noted.   ____________________________________________   LABS (all labs ordered are listed, but only abnormal results are displayed)  Labs Reviewed  COMPREHENSIVE METABOLIC PANEL - Abnormal; Notable for the following components:  Result Value   Sodium 130 (*)    CO2 18 (*)    Glucose, Bld 246 (*)    BUN 33 (*)    Creatinine, Ser 1.01 (*)    Calcium 8.3 (*)    Total Protein 6.1 (*)    Albumin 2.9 (*)    AST 14 (*)    Total Bilirubin 1.3 (*)    GFR, Estimated 57 (*)    All other components within normal limits  CBC WITH DIFFERENTIAL/PLATELET - Abnormal; Notable for the following components:   WBC 13.0 (*)    Hemoglobin 10.9 (*)    HCT 35.6 (*)    RDW 15.8 (*)    Neutro Abs 12.0 (*)    Lymphs Abs 0.3 (*)    All  other components within normal limits  PROTIME-INR - Abnormal; Notable for the following components:   Prothrombin Time 17.1 (*)    INR 1.4 (*)    All other components within normal limits  URINALYSIS, W/ REFLEX TO CULTURE (INFECTION SUSPECTED) - Abnormal; Notable for the following components:   Color, Urine AMBER (*)    APPearance CLOUDY (*)    Hgb urine dipstick LARGE (*)    Protein, ur 100 (*)    Nitrite POSITIVE (*)    Leukocytes,Ua LARGE (*)    Bacteria, UA MANY (*)    All other components within normal limits  RESP PANEL BY RT-PCR (RSV, FLU A&B, COVID)  RVPGX2  CULTURE, BLOOD (ROUTINE X 2)  CULTURE, BLOOD (ROUTINE X 2)  URINE CULTURE  I-STAT CG4 LACTIC ACID, ED   ____________________________________________  EKG   EKG Interpretation Date/Time:  Wednesday October 14 2023 20:21:01 EST Ventricular Rate:  82 PR Interval:  124 QRS Duration:  98 QT Interval:  396 QTC Calculation: 463 R Axis:   74  Text Interpretation: Sinus rhythm Borderline repolarization abnormality Confirmed by Alona Bene [phone]) on 10/14/2023 8:34:54 PM        ____________________________________________  RADIOLOGY  DG Chest Portable 1 View  Result Date: 10/14/2023 CLINICAL DATA:  Fever, cervical and rectal cancer EXAM: PORTABLE CHEST 1 VIEW COMPARISON:  Radiograph 09/10/2023 FINDINGS: Right chest wall Port-A-Cath tip in the right atrium. Stable cardiomediastinal silhouette. Aortic atherosclerotic calcification. No focal consolidation, pleural effusion, or pneumothorax. No displaced rib fractures. IMPRESSION: No active disease. Electronically Signed   By: Minerva Fester M.D.   On: 10/14/2023 21:15   US Abdomen Limited RUQ (LIVER/GB)  Result Date: 10/14/2023 CLINICAL DATA:  [id number] Abdominal pain [id number] EXAM: ULTRASOUND ABDOMEN LIMITED RIGHT UPPER QUADRANT COMPARISON:  HIDA scan 10/14/2023, CT 10/12/2023 FINDINGS: Gallbladder: Gallbladder is distended measuring 13 cm in length. No significant  wall thickening. No sludge or stones within the gallbladder. No sonographic Murphy sign noted by sonographer. Common bile duct: Not visualized. Liver: No focal lesion identified. Within normal limits in parenchymal echogenicity. Portal vein is patent on color Doppler imaging with normal direction of blood flow towards the liver. Other: None. Technical note: Sonographer describes a technically difficult study 2 2 poor penetration related to patient body habitus. Portions of the left hepatic lobe as well as the common bile duct were not able to be assessed. IMPRESSION: 1. Distended gallbladder without sonographic evidence of acute cholecystitis. 2. Nonvisualization of the common bile duct. Electronically Signed   By: Duanne Guess D.O.   On: 10/14/2023 16:02   NM Hepatobiliary Liver Func  Result Date: 10/14/2023 CLINICAL DATA:  Concern for acalculous cholecystitis. Biliary colic. EXAM: NUCLEAR MEDICINE HEPATOBILIARY IMAGING TECHNIQUE: Sequential  images of the abdomen were obtained out to 60 minutes following intravenous administration of radiopharmaceutical. RADIOPHARMACEUTICALS:  5.5 mCi Tc-55m  Choletec IV COMPARISON:  CT 10/12/2019 FINDINGS: Prompt clearance radiotracer from blood pool and homogeneous uptake in liver. Counts are evident in the small bowel 25 minutes. Small focus of activity is noted in the region the neck of the gallbladder at 50 minutes. This activity continues to expand over 2 Hours of imaging. Favor delayed filling of the gallbladder. IMPRESSION: 1. Patent common bile duct. 2. Delayed filling of the gallbladder. Ejection fraction could not be performed as only partial filling of the gallbladder at 2 hours imaging. Electronically Signed   By: Genevive Bi M.D.   On: 10/14/2023 11:19    ____________________________________________   PROCEDURES  Procedure(s) performed:   Procedures  None  ____________________________________________   INITIAL IMPRESSION / ASSESSMENT AND  PLAN / ED COURSE  Pertinent labs & imaging results that were available during my care of the patient were reviewed by me and considered in my medical decision making (see chart for details).   This patient is Presenting for Evaluation of abdominal pain/fever, which does require a range of treatment options, and is a complaint that involves a high risk of morbidity and mortality.  The Differential Diagnoses includes but is not exclusive to acute cholecystitis, intrathoracic causes for epigastric abdominal pain, gastritis, duodenitis, pancreatitis, small bowel or large bowel obstruction, abdominal aortic aneurysm, hernia, gastritis, etc.   Critical Interventions-    Medications  sodium chloride 0.9 % bolus 500 mL (0 mLs Intravenous Stopped 10/14/23 2155)  morphine (PF) 4 MG/ML injection 4 mg (4 mg Intravenous Given 10/14/23 2046)  ondansetron (ZOFRAN) injection 4 mg (4 mg Intravenous Given 10/14/23 2046)  cefTRIAXone (ROCEPHIN) 1 g in sodium chloride 0.9 % 100 mL IVPB (0 g Intravenous Stopped 10/14/23 2233)    Reassessment after intervention: pain improved.    I did obtain Additional Historical Information from EMS.   I decided to review pertinent External Data, and in summary patient was discharged today after RUQ and HIDA scans both done today along with general surgery consult. No evidence of acute cholecystitis.   Clinical Laboratory Tests Ordered, included UA consistent with UTI.  Leukocytosis new from earlier today.  Normal lactic acid.  COVID/flu/RSV negative.  Radiologic Tests Ordered, included CXR. I independently interpreted the images and agree with radiology interpretation.   Cardiac Monitor Tracing which shows NSR.    Social Determinants of Health Risk patient is a non-smoker.   Consult complete with [facility]. Plan for admit.   Medical Decision Making: Summary:  Patient presents emergency department with return of abdominal pain and vomiting.  Arrives with fever and new  leukocytosis.  No focal right upper quadrant tenderness, similar to exam documented earlier today.  Plan for chest x-ray, labs, UA and reassess.   Reevaluation with update and discussion with patient. She has a leukocytosis but normal lactic acid. SIRS vital resolved on reassessment. No AMS. Doubt sepsis but will cover with abx for UTI and admit.    Patient's presentation is most consistent with acute presentation with potential threat to life or bodily function.   Disposition: discharge  ____________________________________________  FINAL CLINICAL IMPRESSION(S) / ED DIAGNOSES  Final diagnoses:  Generalized abdominal pain  Nausea and vomiting, unspecified vomiting type  Fever, unspecified fever cause    Note:  This document was prepared using Dragon voice recognition software and may include unintentional dictation errors.  Alona Bene, MD, [facility] Emergency Medicine  Maia Plan, MD 10/14/23 2311

## 2023-10-14 NOTE — Progress Notes (Addendum)
Daily Progress Note  DOA: 10/12/2023 Hospital Day: 3  Chief Complaint:  upper abdominal pain   ASSESSMENT    Brief Narrative:  [name] is a 77 y.o. year old female with a history of  rectal/cervical adenocarcinoma with colostomy, DM2, CVA , hypothyroidism, GI bleed in Oct 2024 (erosive gastritis, duodenal ulcers). Readmitted 11/25 with abdominal pain / vomiting Hematemesis / and mild drop in hgb and concern for hematemesis. GI saw in consult 11/26  History of GI bleed on plavix in October 2024 with EGD findings of erosive gastritis, duodenal ulcers / duodenal stricture.   Epigastric pain / vomiting / ? Hematemesis / abnormal HIDA No ulcers on repeat EGD yesterday. No hematemesis since admission and vague history of it even prior to admission. HIDA done for distended gallbladder on CT scan shows delayed gallbladder filling.  EF couldn't be calculated given only partial filling of the gallbladder. Chronic acalculous cholecystitis?  Today: Hgb is 9.1, not much different than what is was prior to this admission. Vomiting once this am after taking oral meds / drinking water. Subsequently tolerated some solids.   History of rectal adenocarcinoma  s/p colectomy with colostomy  CT scan this admission showing suggestion of a 5.4 x 3.4 cm irregular annular rectal mass, (poorly visualized due to streak artifact from left hip hardware). Rectal tumor not excluded.Dr. Leonides Schanz discussed with Radiology Dr. Chales Abrahams and he does think that this seems more significant than prior imaging. He recommended rectal EUS vs. CT with rectal contrast vs. PET/CT for further evaluation (Dr. Chales Abrahams with radiology did not think MRI would feasible due to proximity of her prosthesis). Discussed with Dr. Truett Perna and apparently patient has refused all further treatment for her rectal cancer due to intolerance of chemotherapy. The findings and recommendations were discussed with the patient.  Active Problems:   Upper  GI bleed   Gastrointestinal hemorrhage   PLAN   --PPI BID for 10 weeks, then QD  --Sucralfate suspension 1 g QID for 4 weeks. --Soft diet. --Consulting General Surgery?  --Please do not resume Plavix until Surgery evaluates.    Subjective   Vomiting once this am after taking oral meds / drinking water. Subsequently tolerated some solids. Granddaughter visiting. Apparently patient complaining earlier of LEFT sided abdominal pain.    Objective   HIDA 11/27 IMPRESSION: 1. Patent common bile duct. 2. Delayed filling of the gallbladder. Ejection fraction could not be performed as only partial filling of the gallbladder at 2 hours imaging  Small bowel endoscopy 10/13/23 - Non-obstructing Schatzki ring. - Gastritis. Biopsied. - Duodenal stenosis. - A single non-bleeding angioectasia in the duodenum. Treated with argon plasma coagulation (APC).  FINAL MICROSCOPIC DIAGNOSIS:   A. STOMACH, BIOPSY:       Gastric antral / oxyntic mucosa with chronic inactive gastritis.       No H. pylori identified on HE stain.       Negative for intestinal metaplasia or dysplasia.   09/11/23 EGD - Duodenal ulcers with strictures. Likely etiology of UGI bleeding. No active bleeding. - Erosive gastritis. Diagnosis 1. Surgical [P], duodenal bulb, 2nd portion of duodenum, and distal duodenum MILD ACUTE DUODENITIS WITH GASTRIC METAPLASIA CONSISTENT WITH PEPTIC DUODENITIS 2. Surgical [P], fundus, gastric antrum, and gastric body REACTIVE GASTROPATHY WITH FOVEOLAR HYPERPLASIA AND FOCAL SURFACE EROSION MILD CHRONIC GASTRITIS NEGATIVE FOR H. PYLORI, INTESTINAL METAPLASIA, DYSPLASIA AND CARCINOMA  01/23/21 colonoscopy with Dr Lavon Paganini for Evaluation of unexplained GI bleeding presenting with Hematochezia, Evaluation of unexplained GI  bleeding presenting with fecal occult blood, Lower abdominal pain, Unexplained iron deficiency anemia; Impression: - Stricture in the recto- sigmoid colon. Biopsied. - Rule out  malignancy, partially obstructing tumor in the recto- sigmoid colon. Biopsied. Tattooed.   FINAL MICROSCOPIC DIAGNOSIS:   A. RECTUM, BIOPSY:  -  Adenocarcinoma arising in a background of high-grade dysplasia  -  See comment   Recent Labs    10/12/23 1206 10/13/23 0103 10/13/23 0951 10/13/23 1746 10/14/23 0426  WBC 9.7 5.7  --   --  6.5  HGB 10.3* 9.0* 10.0* 10.4* 9.1*  HCT 34.1* 31.1* 33.2* 34.3* 30.0*  PLT 222 196  --   --  160   BMET Recent Labs    10/12/23 1206 10/13/23 0103 10/14/23 0426  NA 137 141 138  K 4.1 3.5 3.3*  CL 103 107 106  CO2 24 22 22   GLUCOSE 138* 81 201*  BUN 22 23 35*  CREATININE 1.05* 0.99 1.09*  CALCIUM 8.8* 8.2* 7.9*   LFT Recent Labs    10/14/23 0426  PROT 5.6*  ALBUMIN 2.5*  AST 13*  ALT 9  ALKPHOS 71  BILITOT 0.7   PT/INR Recent Labs    10/13/23 0103  LABPROT 15.5*  INR 1.2     Imaging:  CT ABDOMEN PELVIS W CONTRAST CLINICAL DATA:  Inpatient. Abdominal pain, acute, nonlocalized. Rectal and cervical cancer. * Tracking Code: BO *  EXAM: CT ABDOMEN AND PELVIS WITH CONTRAST  TECHNIQUE: Multidetector CT imaging of the abdomen and pelvis was performed using the standard protocol following bolus administration of intravenous contrast.  RADIATION DOSE REDUCTION: This exam was performed according to the departmental dose-optimization program which includes automated exposure control, adjustment of the mA and/or kV according to patient size and/or use of iterative reconstruction technique.  CONTRAST:  OMNIPAQUE IOHEXOL 300 MG/ML  SOLN  COMPARISON:  01/21/2022 CT chest, abdomen and pelvis. 04/22/2023 CT pelvis.  FINDINGS: Lower chest: Tip of superior approach central venous catheter is seen at the cavoatrial junction.  Hepatobiliary: Normal liver size. No liver mass. Moderately distended gallbladder. No radiopaque cholelithiasis. No gallbladder wall thickening. No biliary ductal dilatation.  Pancreas: Normal,  with no mass or duct dilation.  Spleen: Normal size. No mass.  Adrenals/Urinary Tract: Normal adrenals. Moderate asymmetric right renal atrophy with multifocal renal cortical scarring in the right kidney, unchanged. No hydronephrosis. No renal masses. Bladder obscured by streak artifact from left hip hardware. Collapsed bladder with suggestion of mild diffuse bladder wall thickening.  Stomach/Bowel: Normal non-distended stomach. Normal caliber small bowel with no small bowel wall thickening. Appendectomy. Diverting descending colon loop colostomy in the ventral left abdominal wall. Suggestion of a 5.4 x 3.4 cm irregular annular rectal mass, poorly visualized due to streak artifact from left hip hardware (series 2/image 70). Otherwise no large bowel wall thickening or significant diverticulosis.  Vascular/Lymphatic: Atherosclerotic nonaneurysmal abdominal aorta. Patent portal, splenic, hepatic and renal veins. Mildly enlarged 1.1 cm right inguinal node (series 2/image 76), unchanged from 04/22/2023 CT. No new pathologically enlarged abdominopelvic nodes.  Reproductive: Apparent hysterectomy.  No adnexal masses.  Other: Small volume ascites, predominantly right pericolic gutter and pelvic. No pneumoperitoneum. No focal fluid collection.  Musculoskeletal: No aggressive appearing focal osseous lesions. Mild thoracolumbar spondylosis. Left total hip arthroplasty.  IMPRESSION: 1. No evidence of bowel obstruction or acute bowel inflammation. 2. Suggestion of a 5.4 x 3.4 cm irregular annular rectal mass, poorly visualized due to streak artifact from left hip hardware. Viable rectal tumor not excluded.  3. Small volume ascites, predominantly right paracolic gutter and pelvic. 4. Mildly enlarged right inguinal node, unchanged from 04/22/2023 CT. No new pathologically enlarged abdominopelvic nodes. 5. Collapsed bladder with suggestion of mild diffuse bladder wall thickening, nonspecific,  cannot exclude cystitis. Suggest correlation with urinalysis. No hydronephrosis. 6. Moderately distended gallbladder, nonspecific. No radiopaque cholelithiasis. No gallbladder wall thickening. No biliary ductal dilatation. 7. Chronic moderate asymmetric right renal atrophy. 8.  Aortic Atherosclerosis (ICD10-I70.0).  Electronically Signed   By: Delbert Phenix M.D.   On: 10/12/2023 19:40     Scheduled inpatient medications:   atorvastatin  40 mg Oral q morning   Chlorhexidine Gluconate Cloth  6 each Topical Daily   gabapentin  300 mg Oral Daily   And   gabapentin  600 mg Oral QHS   insulin aspart  0-15 Units Subcutaneous TID WC   insulin aspart  0-5 Units Subcutaneous QHS   levothyroxine  137 mcg Oral QAC breakfast   melatonin  5 mg Oral QHS   oxyCODONE  10 mg Oral Q12H   pantoprazole  40 mg Oral BID   potassium chloride  40 mEq Oral Once   sertraline  50 mg Oral Daily   sodium chloride flush  10-40 mL Intracatheter Q12H   sucralfate  1 g Oral TID WC & HS   Continuous inpatient infusions:  PRN inpatient medications: acetaminophen **OR** acetaminophen, albuterol, baclofen, sodium chloride flush  Vital signs in last 24 hours: Temp:  [97.7 F (36.5 C)-99.7 F (37.6 C)] 99 F (37.2 C) (11/27 0447) Pulse Rate:  [84-92] 88 (11/27 0447) Resp:  [16-24] 16 (11/27 0447) BP: (140-159)/(37-57) 141/49 (11/27 0447) SpO2:  [92 %-100 %] 99 % (11/27 0447) Weight:  [61.7 kg] 61.7 kg (11/26 1400) Last BM Date : 10/12/23  Intake/Output Summary (Last 24 hours) at 10/14/2023 1039 Last data filed at 10/14/2023 0000 Gross per 24 hour  Intake 660 ml  Output 500 ml  Net 160 ml    Intake/Output from previous day: 11/26 0701 - 11/27 0700 In: 660 [P.O.:360; I.V.:300] Out: 1050 [Urine:550; Stool:500] Intake/Output this shift: No intake/output data recorded.   Physical Exam:  General: Alert female in NAD Heart:  Regular rate and rhythm.  Pulmonary: Normal respiratory effort Abdomen:  Soft, nondistended, moderate generalized upper abdominal tenderssn. Normal bowel sounds. Neurologic: Alert and oriented Psych: Pleasant. Cooperative. Insight appears normal.      LOS: 0 days   Willette Cluster ,NP 10/14/2023, 10:39 AM

## 2023-10-14 NOTE — Consult Note (Signed)
[facility] Surgery Consult Note  JADIRA NYGREN [birth year]  [id number].    Requesting MD: Willette Cluster NP Chief Complaint/Reason for Consult: abdominal pain  HPI:  NATALIAH FERIA is a 77 y.o. female with h/o recurrent cervical cancer and rectal cancer s/p total abdominal hysterectomy, cystoscopy with insertion of ureteral stents, exploratory laparotomy, aborted low anterior resection, descending loop colostomy 04/07/21 at [facility], previously on chemo but stopped in 2022 due to intolerance; she has followed with Dr. Truett Perna for this with no clinical progression of cancer. Also with h/o CVA on plavix, and recent admission for GIB where she underwent EGD 09/11/23 and was found to have duodenal ulcers with strictures and erosive gastritis. She returned to the ED 10/12/23 for evaluation of abdominal pain. Granddaughter at bedside who assisted with history taking. States that she developed abdominal pain, nausea, and vomiting 3 days ago. Pain is across her upper abdominal pain. She has had several episodes of emesis. She also reports increased, looser ostomy output at that time. Denies fever or chills. The day before her symptoms began she did eat out at [employer] with family, but no one else got sick.   Initial work up included CT scan which showed a moderately distended gallbladder with no definite stones or signs of acute cholecystitis. She underwent repeat upper endoscopy yesterday which showed gastritis but no ulcers. She remains on PPI and was started on Carafate. It was not felt this explained her symptoms so a HIDA scan was obtained which is negative for acute cholecystitis but questioned delayed filling of the gallbladder. General surgery asked to see.    Family History  Problem Relation Age of Onset   Emphysema Father    Diabetes Father    Emphysema Sister    Emphysema Brother    Cancer Mother        Skin Cancer   Diabetes Mother    Colon cancer Neg Hx    Esophageal cancer  Neg Hx    Rectal cancer Neg Hx    Stomach cancer Neg Hx     Past Medical History:  Diagnosis Date   Arthritis    Basal ganglia stroke (HCC)    March 2022   Bilateral lower extremity edema    Cancer (HCC)    twice- colon cancer, unsure of the other type of cancer   Carotid artery occlusion    Flank pain    Full dentures    History of cervical cancer    11/ 1994  Stage IB  s/p  high dose radiation brachytherapy @ [facility] 01/ 1995--- per pt no recurrence   History of chronic bronchitis    History of hyperthyroidism    d 03/ 2011---due to grave's disease--- s/p  RAI treatment 04/ 2011   History of kidney stones    History of sepsis 05/03/2018   due to UTI with klebsiella/ pyelonephritis/ ureteral obstruction cause by stone   Hyperlipidemia    Hypothyroidism, postradioiodine therapy    endocrinologist--  dr Everardo All--  dx graves disease and s/p RAI i131 treatement 4/ 2011   Nauseated    Oral thrush 05/03/2018   Pneumonia    Type 2 diabetes mellitus treated with insulin (HCC)    FOLLOWED BY PCP   Urgency of urination    Wears glasses     Past Surgical History:  Procedure Laterality Date   APPENDECTOMY     BIOPSY  01/23/2021   Procedure: BIOPSY;  Surgeon: Napoleon Form, MD;  Location: [facility];  Service: Endoscopy;;   BIOPSY  10/12/2021   Procedure: BIOPSY;  Surgeon: Tressia Danas, MD;  Location: [facility];  Service: Gastroenterology;;   BIOPSY  09/11/2023   Procedure: BIOPSY;  Surgeon: Lynann Bologna, MD;  Location: [facility];  Service: Gastroenterology;;   CAROTID ENDARTERECTOMY Right 03/28/2020   CATARACT EXTRACTION W/ INTRAOCULAR LENS  IMPLANT, BILATERAL  2004  approx.   COLONOSCOPY     COLONOSCOPY WITH PROPOFOL N/A 01/23/2021   Procedure: COLONOSCOPY WITH PROPOFOL;  Surgeon: Napoleon Form, MD;  Location: [facility];  Service: Endoscopy;  Laterality: N/A;   CYSTOSCOPY WITH STENT PLACEMENT Right 05/04/2018   Procedure: CYSTOSCOPY WITH STENT  PLACEMENT AND RETROGRADE PYELOGRAM;  Surgeon: Rene Paci, MD;  Location: [facility];  Service: Urology;  Laterality: Right;   CYSTOSCOPY/URETEROSCOPY/HOLMIUM LASER/STENT PLACEMENT Right 05/19/2018   Procedure: CYSTOSCOPY, URETEROSCOPY/HOLMIUM LASER, STONE BASKETRY/ STENT EXCHANGE;  Surgeon: Rene Paci, MD;  Location: [facility];  Service: Urology;  Laterality: Right;   ENDARTERECTOMY Right 03/28/2020   Procedure: RIGHT CAROTID ENDARTERECTOMY with PATCH ANGIOPLASTY;  Surgeon: Larina Earthly, MD;  Location: [facility];  Service: Vascular;  Laterality: Right;   ESOPHAGOGASTRODUODENOSCOPY (EGD) WITH PROPOFOL N/A 10/12/2021   Procedure: ESOPHAGOGASTRODUODENOSCOPY (EGD) WITH PROPOFOL;  Surgeon: Tressia Danas, MD;  [facility]: [facility];  Service: Gastroenterology;  Laterality: N/A;   ESOPHAGOGASTRODUODENOSCOPY (EGD) WITH PROPOFOL N/A 09/11/2023   Procedure: ESOPHAGOGASTRODUODENOSCOPY (EGD) WITH PROPOFOL;  Surgeon: Lynann Bologna, MD;  Location: [facility];  Service: Gastroenterology;  Laterality: N/A;   EXCISION MASS LEFT CHEST WALL  09-20-2007   dr Janee Morn  [facility]   Radioactive Iodine Therapy     for thyroid   REVISION TOTAL HIP ARTHROPLASTY Left early 2000s   SUBMUCOSAL TATTOO INJECTION  01/23/2021   Procedure: SUBMUCOSAL TATTOO INJECTION;  Surgeon: Napoleon Form, MD;  Location: [facility];  Service: Endoscopy;;   TANDEM RING INSERTION  1995   dr Loree Fee @ [facility]   EUA w/ tandem placement in ovid  (for direct high dose radiation brachytherapy , cervical cancer)   TOTAL HIP ARTHROPLASTY Left 1990s   TRANSTHORACIC ECHOCARDIOGRAM  08/07/2017   mild focal basal hypertrophy of the septum,  ef 60-65%, grade 1 diastolic dysfunction/  atrial septum with lipomatous hypertrophy/  trivial PR   UPPER GASTROINTESTINAL ENDOSCOPY      Social History:  reports that she quit smoking about 41 years ago. Her smoking use included cigarettes. She started smoking  about 46 years ago. She has been exposed to tobacco smoke. She has never used smokeless tobacco. She reports that she does not drink alcohol and does not use drugs.  Allergies:  Allergies  Allergen Reactions   Codeine Nausea Only    Medications Prior to Admission  Medication Sig Dispense Refill   acetaminophen (TYLENOL) 500 MG tablet Take 500 mg by mouth daily as needed for moderate pain (pain score 4-6) or headache.     ascorbic acid (VITAMIN C) 500 MG tablet Take 1 tablet (500 mg total) by mouth daily. 30 tablet 0   atorvastatin (LIPITOR) 40 MG tablet Take 40 mg by mouth at bedtime.     baclofen (LIORESAL) 10 MG tablet Take 20 mg by mouth 2 (two) times daily.     clopidogrel (PLAVIX) 75 MG tablet Take 1 tablet (75 mg total) by mouth at bedtime. (Patient taking differently: Take 75 mg by mouth daily.) 30 tablet 1   gabapentin (NEURONTIN) 300 MG capsule Take 300-600 mg by mouth See admin instructions. Take one capsule (  300 mg) by mouth every morning and two capsules (600 mg) at night     gemfibrozil (LOPID) 600 MG tablet Take 600 mg by mouth 2 (two) times daily.     Homeopathic Products (LEG CRAMPS) TABS Take 1 tablet by mouth 2 (two) times daily as needed (leg pain).     levothyroxine (SYNTHROID) 137 MCG tablet Take 137 mcg by mouth daily before breakfast.     MAGnesium-Oxide 400 (240 Mg) MG tablet Take 1 tablet by mouth twice daily 60 tablet 2   MELATONIN PO Take 1 tablet by mouth at bedtime.     metFORMIN (GLUCOPHAGE) 500 MG tablet Take 500 mg by mouth 2 (two) times daily with a meal.     nystatin (MYCOSTATIN/NYSTOP) powder Apply 1 Application topically 2 (two) times daily. To groin rash (Patient taking differently: Apply 1 Application topically 2 (two) times daily as needed (groin irritation, rash).) 15 g 1   oxyCODONE ER (XTAMPZA ER) 9 MG C12A Take 9 mg by mouth in the morning and at bedtime.     pantoprazole (PROTONIX) 40 MG tablet Take 1 tablet (40 mg total) by mouth 2 (two) times  daily. 60 tablet 3   sertraline (ZOLOFT) 50 MG tablet Take 50 mg by mouth at bedtime.      Prior to Admission medications   Medication Sig Start Date End Date Taking? Authorizing Provider  acetaminophen (TYLENOL) 500 MG tablet Take 500 mg by mouth daily as needed for moderate pain (pain score 4-6) or headache.   Yes [provider]  ascorbic acid (VITAMIN C) 500 MG tablet Take 1 tablet (500 mg total) by mouth daily. 12/03/21  Yes Rolly Salter, MD  atorvastatin (LIPITOR) 40 MG tablet Take 40 mg by mouth at bedtime. 12/15/19  Yes [provider]  baclofen (LIORESAL) 10 MG tablet Take 20 mg by mouth 2 (two) times daily. 03/22/21  Yes [provider]  clopidogrel (PLAVIX) 75 MG tablet Take 1 tablet (75 mg total) by mouth at bedtime. Patient taking differently: Take 75 mg by mouth daily. 10/12/21  Yes Willeen Niece, MD  gabapentin (NEURONTIN) 300 MG capsule Take 300-600 mg by mouth See admin instructions. Take one capsule (300 mg) by mouth every morning and two capsules (600 mg) at night   Yes [provider]  gemfibrozil (LOPID) 600 MG tablet Take 600 mg by mouth 2 (two) times daily.   Yes [provider]  Homeopathic Products (LEG CRAMPS) TABS Take 1 tablet by mouth 2 (two) times daily as needed (leg pain).   Yes [provider]  levothyroxine (SYNTHROID) 137 MCG tablet Take 137 mcg by mouth daily before breakfast.   Yes [provider]  MAGnesium-Oxide 400 (240 Mg) MG tablet Take 1 tablet by mouth twice daily 08/10/23  Yes Ladene Artist, MD  MELATONIN PO Take 1 tablet by mouth at bedtime.   Yes [provider]  metFORMIN (GLUCOPHAGE) 500 MG tablet Take 500 mg by mouth 2 (two) times daily with a meal. 02/01/21  Yes [provider]  nystatin (MYCOSTATIN/NYSTOP) powder Apply 1 Application topically 2 (two) times daily. To groin rash Patient taking differently: Apply 1 Application topically 2 (two) times daily as needed  (groin irritation, rash). 07/24/23  Yes Ladene Artist, MD  oxyCODONE ER [facility]) 9 MG C12A Take 9 mg by mouth in the morning and at bedtime.   Yes [provider]  pantoprazole (PROTONIX) 40 MG tablet Take 1 tablet (40 mg total)  by mouth 2 (two) times daily. 09/13/23  Yes Lewie Chamber, MD  sertraline (ZOLOFT) 50 MG tablet Take 50 mg by mouth at bedtime.   Yes [provider]    Blood pressure (!) 126/44, pulse 82, temperature 98.9 F (37.2 C), temperature source Oral, resp. rate (!) 22, height 5' (1.524 m), weight 61.7 kg, SpO2 96%. Physical Exam: General: pleasant, frail, elderly female who is laying in bed in NAD HEENT: head is normocephalic, atraumatic.  Sclera are noninjected.  Pupils equal and round.  Ears and nose without any masses or lesions.  Mouth is pink and moist. Dentition fair Heart: regular, rate, and rhythm Lungs: CTAB, no wheezes, rhonchi, or rales noted.  Respiratory effort nonlabored on room air Abd: soft, minimal distension, very tender in the left and right upper quadrants with some hypersensitivity to touch, no diffuse peritonitis, ostomy viable with air and small amount of thin light brown stool in bag Skin: warm and dry with no masses, lesions, or rashes Neuro: MAEs, no gross motor or sensory deficits BUE/BLE  Results for orders placed or performed during the hospital encounter of 10/12/23 (from the past 48 hour(s))  Glucose, capillary     Status: Abnormal   Collection Time: 10/12/23  3:55 PM  Result Value Ref Range   Glucose-Capillary 117 (H) 70 - 99 mg/dL    Comment: Glucose reference range applies only to samples taken after fasting for at least 8 hours.  Glucose, capillary     Status: None   Collection Time: 10/12/23  8:36 PM  Result Value Ref Range   Glucose-Capillary 77 70 - 99 mg/dL    Comment: Glucose reference range applies only to samples taken after fasting for at least 8 hours.  Glucose, capillary     Status: None   Collection  Time: 10/12/23 11:12 PM  Result Value Ref Range   Glucose-Capillary 83 70 - 99 mg/dL    Comment: Glucose reference range applies only to samples taken after fasting for at least 8 hours.  Protime-INR     Status: Abnormal   Collection Time: 10/13/23  1:03 AM  Result Value Ref Range   Prothrombin Time 15.5 (H) 11.4 - 15.2 seconds   INR 1.2 0.8 - 1.2    Comment: (NOTE) INR goal varies based on device and disease states. Performed at [facility], [address] [place], [place] [address]   Basic metabolic panel     Status: Abnormal   Collection Time: 10/13/23  1:03 AM  Result Value Ref Range   Sodium 141 135 - 145 mmol/L   Potassium 3.5 3.5 - 5.1 mmol/L   Chloride 107 98 - 111 mmol/L   CO2 22 22 - 32 mmol/L   Glucose, Bld 81 70 - 99 mg/dL    Comment: Glucose reference range applies only to samples taken after fasting for at least 8 hours.   BUN 23 8 - 23 mg/dL   Creatinine, Ser 1.91 0.44 - 1.00 mg/dL   Calcium 8.2 (L) 8.9 - 10.3 mg/dL   GFR, Estimated 59 (L) >60 mL/min    Comment: (NOTE) Calculated using the CKD-EPI Creatinine Equation (2021)    Anion gap 12 5 - 15    Comment: Performed at [facility], [address] [place], [place] [address]  CBC     Status: Abnormal   Collection Time: 10/13/23  1:03 AM  Result Value Ref Range   WBC 5.7 4.0 - 10.5 K/uL   RBC 3.30 (  L) 3.87 - 5.11 MIL/uL   Hemoglobin 9.0 (L) 12.0 - 15.0 g/dL   HCT 16.1 (L) 09.6 - 04.5 %   MCV 94.2 80.0 - 100.0 fL   MCH 27.3 26.0 - 34.0 pg   MCHC 28.9 (L) 30.0 - 36.0 g/dL   RDW 40.9 (H) 81.1 - 91.4 %   Platelets 196 150 - 400 K/uL   nRBC 0.0 0.0 - 0.2 %    Comment: Performed at [facility], [address] [place], [place] [address]  Glucose, capillary     Status: Abnormal   Collection Time: 10/13/23  7:41 AM  Result Value Ref Range   Glucose-Capillary 106 (H) 70 - 99 mg/dL    Comment: Glucose reference range applies only to samples taken after  fasting for at least 8 hours.  Glucose, capillary     Status: None   Collection Time: 10/13/23  7:43 AM  Result Value Ref Range   Glucose-Capillary 96 70 - 99 mg/dL    Comment: Glucose reference range applies only to samples taken after fasting for at least 8 hours.  Hemoglobin and hematocrit, blood     Status: Abnormal   Collection Time: 10/13/23  9:51 AM  Result Value Ref Range   Hemoglobin 10.0 (L) 12.0 - 15.0 g/dL   HCT 62.1 (L) 30.8 - 65.7 %    Comment: Performed at [facility], [address] [place], [place] [address]  Glucose, capillary     Status: None   Collection Time: 10/13/23 11:34 AM  Result Value Ref Range   Glucose-Capillary 94 70 - 99 mg/dL    Comment: Glucose reference range applies only to samples taken after fasting for at least 8 hours.  Surgical pathology     Status: None   Collection Time: 10/13/23  3:08 PM  Result Value Ref Range   SURGICAL PATHOLOGY      SURGICAL PATHOLOGY CASE: [phone] PATIENT: Sharen Hones Surgical Pathology Report     Clinical History: Anemia, hematemesis (crm)     FINAL MICROSCOPIC DIAGNOSIS:  A. STOMACH, BIOPSY:      Gastric antral / oxyntic mucosa with chronic inactive gastritis.      No H. pylori identified on HE stain.      Negative for intestinal metaplasia or dysplasia.   GROSS DESCRIPTION:  Received in formalin are tan, soft tissue fragments that are submitted in toto. Number: 8 size: 0.2-0.6 cm blocks: 1 (GRP 10/13/2023)   Final Diagnosis performed by Lance Coon, MD.   Electronically signed 10/14/2023 Technical component performed at [facility], [address] [place], [place] [address].  Professional component performed at [employer], [address] [place], [place] [address].  Immunohistochemistry Technical component (if applicable) was performed at [facility] [address], [place],  [place] [address].    IMMUNOHISTOCHEMISTRY DISCLAIMER (if applicable): Some of these immunohistochemical stains may have been developed and the performance characteristics determine by [facility]. Some may not have been cleared or approved by the U.S. Food and Drug Administration. The FDA has determined that such clearance or approval is not necessary. This test is used for clinical purposes. It should not be regarded as investigational or for research. This laboratory is certified under the Clinical Laboratory Improvement Amendments of 1988 (CLIA-88) as qualified to perform high complexity clinical laboratory testing.  The controls stained appropriately.   IHC stains are performed on formalin fixed, paraffin embedded tissue using a  3,3"diaminobenzidine (DAB) chromogen and Leica Bond Autostainer System. The staining intensity of the nucleus is score manually and is reported as the percentage of tumor cell nuclei demonstrating specific nuclear staining. The specimens  are fixed in 10% Neutral Formalin for at least 6 hours and up to 72hrs. These tests are validated on decalcified tissue. Results should be interpreted with caution given the possibility of false negative results on decalcified specimens. Antibody Clones are as follows ER-clone 76F, PR-clone 16, Ki67- clone MM1. Some of these immunohistochemical stains may have been developed and the performance characteristics determined by [facility] Pathology.   Glucose, capillary     Status: None   Collection Time: 10/13/23  4:11 PM  Result Value Ref Range   Glucose-Capillary 90 70 - 99 mg/dL    Comment: Glucose reference range applies only to samples taken after fasting for at least 8 hours.  Hemoglobin and hematocrit, blood     Status: Abnormal   Collection Time: 10/13/23  5:46 PM  Result Value Ref Range   Hemoglobin 10.4 (L) 12.0 - 15.0 g/dL   HCT 96.0 (L) 45.4 - 09.8 %    Comment: Performed at [facility], [address] [place] [address], [place], [place] [address]  Glucose, capillary     Status: Abnormal   Collection Time: 10/13/23  8:25 PM  Result Value Ref Range   Glucose-Capillary 196 (H) 70 - 99 mg/dL    Comment: Glucose reference range applies only to samples taken after fasting for at least 8 hours.  CBC     Status: Abnormal   Collection Time: 10/14/23  4:26 AM  Result Value Ref Range   WBC 6.5 4.0 - 10.5 K/uL   RBC 3.31 (L) 3.87 - 5.11 MIL/uL   Hemoglobin 9.1 (L) 12.0 - 15.0 g/dL   HCT 78.2 (L) 95.6 - 21.3 %   MCV 90.6 80.0 - 100.0 fL   MCH 27.5 26.0 - 34.0 pg   MCHC 30.3 30.0 - 36.0 g/dL   RDW 08.6 (H) 57.8 - 46.9 %   Platelets 160 150 - 400 K/uL   nRBC 0.0 0.0 - 0.2 %    Comment: Performed at [facility], [address], [place], [place] [address]  Comprehensive metabolic panel     Status: Abnormal   Collection Time: 10/14/23  4:26 AM  Result Value Ref Range   Sodium 138 135 - 145 mmol/L   Potassium 3.3 (L) 3.5 - 5.1 mmol/L   Chloride 106 98 - 111 mmol/L   CO2 22 22 - 32 mmol/L   Glucose, Bld 201 (H) 70 - 99 mg/dL    Comment: Glucose reference range applies only to samples taken after fasting for at least 8 hours.   BUN 35 (H) 8 - 23 mg/dL   Creatinine, Ser 8.41 (H) 0.44 - 1.00 mg/dL   Calcium 7.9 (L) 8.9 - 10.3 mg/dL   Total Protein 5.6 (L) 6.5 - 8.1 g/dL   Albumin 2.5 (L) 3.5 - 5.0 g/dL   AST 13 (L) 15 - 41 U/L   ALT 9 0 - 44 U/L   Alkaline Phosphatase 71 38 - 126 U/L   Total Bilirubin 0.7 <1.2 mg/dL   GFR, Estimated 52 (L) >60 mL/min    Comment: (NOTE) Calculated using the CKD-EPI Creatinine Equation (2021)    Anion gap 10 5 - 15    Comment: Performed at [facility], [address], [place], [place] [address]  Glucose, capillary  Status: Abnormal   Collection Time: 10/14/23  7:36 AM  Result Value Ref Range   Glucose-Capillary 172 (H) 70 - 99 mg/dL    Comment: Glucose reference range applies only to samples taken after fasting for at least  8 hours.  Glucose, capillary     Status: Abnormal   Collection Time: 10/14/23 11:22 AM  Result Value Ref Range   Glucose-Capillary 132 (H) 70 - 99 mg/dL    Comment: Glucose reference range applies only to samples taken after fasting for at least 8 hours.   NM Hepatobiliary Liver Func  Result Date: 10/14/2023 CLINICAL DATA:  Concern for acalculous cholecystitis. Biliary colic. EXAM: NUCLEAR MEDICINE HEPATOBILIARY IMAGING TECHNIQUE: Sequential images of the abdomen were obtained out to 60 minutes following intravenous administration of radiopharmaceutical. RADIOPHARMACEUTICALS:  5.5 mCi Tc-51m  Choletec IV COMPARISON:  CT 10/12/2019 FINDINGS: Prompt clearance radiotracer from blood pool and homogeneous uptake in liver. Counts are evident in the small bowel 25 minutes. Small focus of activity is noted in the region the neck of the gallbladder at 50 minutes. This activity continues to expand over 2 Hours of imaging. Favor delayed filling of the gallbladder. IMPRESSION: 1. Patent common bile duct. 2. Delayed filling of the gallbladder. Ejection fraction could not be performed as only partial filling of the gallbladder at 2 hours imaging. Electronically Signed   By: Genevive Bi M.D.   On: 10/14/2023 11:19   CT ABDOMEN PELVIS W CONTRAST  Result Date: 10/12/2023 CLINICAL DATA:  Inpatient. Abdominal pain, acute, nonlocalized. Rectal and cervical cancer. * Tracking Code: BO * EXAM: CT ABDOMEN AND PELVIS WITH CONTRAST TECHNIQUE: Multidetector CT imaging of the abdomen and pelvis was performed using the standard protocol following bolus administration of intravenous contrast. RADIATION DOSE REDUCTION: This exam was performed according to the departmental dose-optimization program which includes automated exposure control, adjustment of the mA and/or kV according to patient size and/or use of iterative reconstruction technique. CONTRAST:  OMNIPAQUE IOHEXOL 300 MG/ML  SOLN COMPARISON:  01/21/2022 CT  chest, abdomen and pelvis. 04/22/2023 CT pelvis. FINDINGS: Lower chest: Tip of superior approach central venous catheter is seen at the cavoatrial junction. Hepatobiliary: Normal liver size. No liver mass. Moderately distended gallbladder. No radiopaque cholelithiasis. No gallbladder wall thickening. No biliary ductal dilatation. Pancreas: Normal, with no mass or duct dilation. Spleen: Normal size. No mass. Adrenals/Urinary Tract: Normal adrenals. Moderate asymmetric right renal atrophy with multifocal renal cortical scarring in the right kidney, unchanged. No hydronephrosis. No renal masses. Bladder obscured by streak artifact from left hip hardware. Collapsed bladder with suggestion of mild diffuse bladder wall thickening. Stomach/Bowel: Normal non-distended stomach. Normal caliber small bowel with no small bowel wall thickening. Appendectomy. Diverting descending colon loop colostomy in the ventral left abdominal wall. Suggestion of a 5.4 x 3.4 cm irregular annular rectal mass, poorly visualized due to streak artifact from left hip hardware (series 2/image 70). Otherwise no large bowel wall thickening or significant diverticulosis. Vascular/Lymphatic: Atherosclerotic nonaneurysmal abdominal aorta. Patent portal, splenic, hepatic and renal veins. Mildly enlarged 1.1 cm right inguinal node (series 2/image 76), unchanged from 04/22/2023 CT. No new pathologically enlarged abdominopelvic nodes. Reproductive: Apparent hysterectomy.  No adnexal masses. Other: Small volume ascites, predominantly right pericolic gutter and pelvic. No pneumoperitoneum. No focal fluid collection. Musculoskeletal: No aggressive appearing focal osseous lesions. Mild thoracolumbar spondylosis. Left total hip arthroplasty. IMPRESSION: 1. No evidence of bowel obstruction or acute bowel inflammation. 2. Suggestion of a 5.4 x 3.4 cm irregular annular rectal mass, poorly visualized  due to streak artifact from left hip hardware. Viable rectal tumor  not excluded. 3. Small volume ascites, predominantly right paracolic gutter and pelvic. 4. Mildly enlarged right inguinal node, unchanged from 04/22/2023 CT. No new pathologically enlarged abdominopelvic nodes. 5. Collapsed bladder with suggestion of mild diffuse bladder wall thickening, nonspecific, cannot exclude cystitis. Suggest correlation with urinalysis. No hydronephrosis. 6. Moderately distended gallbladder, nonspecific. No radiopaque cholelithiasis. No gallbladder wall thickening. No biliary ductal dilatation. 7. Chronic moderate asymmetric right renal atrophy. 8.  Aortic Atherosclerosis (ICD10-I70.0). Electronically Signed   By: Delbert Phenix M.D.   On: 10/12/2023 19:40    Anti-infectives (From admission, onward)    None        Assessment/Plan Upper abdominal pain, n/v/d - Patient with multiple co-morbidities presents with 3 days of upper abdominal pain, nausea, vomiting, and diarrhea. We are asked to see due to abnormal HIDA scan. The HIDA questions delayed filling of the gallbladder. She does not have acute cholecystitis, no indication for acute surgical intervention. Symptoms and abdominal exam not consistent with biliary disease, but will check u/s to see if she has any gallstones. I wonder if she could have food poisoning versus musculoskeletal pain from her retching. She has been started on Dysphagia 3 diet, we will see how she does. She is on a PPI and Carafate.   ID - none VTE - SCDs FEN - D3 diet Foley - none    I reviewed Consultant gastroenterology notes, last 24 h vitals and pain scores, last 48 h intake and output, last 24 h labs and trends, and last 24 h imaging results.   Franne Forts, PA-C [facility] 10/14/2023, 2:56 PM Please see Amion for pager number during day hours 7:00am-4:30pm

## 2023-10-14 NOTE — ED Triage Notes (Incomplete)
Patient BIB EMS from home c/o weakness.. Patient increase weakness and increase abdominal pain today. Patient recent Discharge this afternoon.

## 2023-10-14 NOTE — Discharge Summary (Signed)
Physician Discharge Summary  [name]:[id number] DOB: 1946-09-12 DOA: 10/12/2023  PCP: Lewis Moccasin, MD  Admit date: 10/12/2023 Discharge date: 10/14/2023  Admitted From: Home  Discharge disposition: Home   Recommendations for Outpatient Follow-Up:   Follow up with your primary care provider in one week.  Check CBC, BMP, magnesium in the next visit Follow-up with medical oncology as outpatient as scheduled by the clinic. Follow-up with GI for ulcers in 3 to 4 weeks.   Discharge Diagnosis:   Active Problems:   Upper GI bleed   Gastrointestinal hemorrhage   Discharge Condition: Improved.  Diet recommendation: Soft diet.  Wound care: None.  Code status: Full.   History of Present Illness:   [name] is a 77 y.o. female with a history of rectal/cervical adenocarcinoma s/p diverting loop ostomy with colostomy, diabetes mellitus type 2, CVA, upper GI bleeding presented to the hospital secondary to abdominal pain, nausea and coffee ground vomiting.  GI was consulted and patient was admitted hospital for further evaluation and treatment.  Hospital Course:   Following conditions were addressed during hospitalization as listed below,  Abdominal pain Right-sided upper abdominal pain.  CT imaging without obvious etiology,gallbladder distention without cholecystitis noted; no ductal dilation; no LFT elevation.  HIDA scan done with patent common bile duct, delayed filling of the gallbladder.  Ejection fraction could not be performed.  GI recommended general surgery input.  Ultrasound did not show any cholelithiasis.  General surgery did not recommend any surgical intervention.  At this time patient wishes to go home.  GI Bleeding Patient with a history of multiple duodenal ulcers, complicated by Plavix use. GI consulted and performed an upper GI endoscopy on 11/26 revealing gastritis (biopsied), duodenal stenosis, and a single non-bleeding angioectasia  in the duodenum treated with APC.  GI recommends PPI BID x10 weeks then every day, sucralfate suspension 1 g QID x4 weeks, soft diet, restart Plavix on 11/27.  Recommend follow-up with GI as outpatient.  History of cervical rectal adenocarcinoma.  Being followed by oncology as outpatient.  Recommend outpatient follow-up.   Mild hypokalemia.  Potassium 3.3 today.  Replenished with oral potassium.  Chronic anemia Hemoglobin baseline of about 9-10. Currently stable.  Latest hemoglobin of 9.1.   Diabetes mellitus type 2, well controlled Last hemoglobin A1C of 5.7%. patient is on metformin as an outpatient.     Chronic pain On Oxycontin, Baclofen, gabapentin, will continue on discharge   Depression -Continue Zoloft   Hypothyroidism -Continue Synthroid   Hyperlipidemia History of CVA Continue Lipitor.  Plavix to be resumed on discharge..  Disposition.  At this time, patient is stable for disposition home with outpatient PCP, GI and oncology follow-up.  Medical Consultants:   GI General surgery  Procedures:    Small bowel enteroscopy 10/13/2023 HIDA scan Subjective:   Today, patient was seen and examined at bedside.  Seen after HIDA scan.  Complains of mild abdominal discomfort.  Has been having output in colostomy.  Wishes to go home.  Discharge Exam:   Vitals:   10/14/23 0447 10/14/23 1107  BP: (!) 141/49 (!) 126/44  Pulse: 88 82  Resp: 16 (!) 22  Temp: 99 F (37.2 C) 98.9 F (37.2 C)  SpO2: 99% 96%   Vitals:   10/13/23 1550 10/13/23 2020 10/14/23 0447 10/14/23 1107  BP: (!) 153/37 (!) 158/53 (!) 141/49 (!) 126/44  Pulse: 92 90 88 82  Resp: (!) 24 (!) 22 16 (!) 22  Temp:  99.7  F (37.6 C) 99 F (37.2 C) 98.9 F (37.2 C)  TempSrc:  Oral Oral Oral  SpO2: 93% 95% 99% 96%  Weight:      Height:       General:  Average built, not in obvious distress HENT:   No scleral pallor or icterus noted. Oral mucosa is moist.  Chest:  Clear breath sounds.  Diminished  breath sounds bilaterally. No crackles or wheezes.  CVS: S1 &S2 heard. No murmur.  Regular rate and rhythm. Abdomen: Soft, t mild enderness over the abdomen mostly on the right upper side.  No rigidity guarding noted.  Colostomy bag in place. Extremities: No cyanosis, clubbing or edema.  Peripheral pulses are palpable. Psych: Alert, awake and oriented, normal mood CNS:  No cranial nerve deficits.  Power equal in all extremities.   Skin: Warm and dry.  No rashes noted.   The results of significant diagnostics from this hospitalization (including imaging, microbiology, ancillary and laboratory) are listed below for reference.     Diagnostic Studies:   CT ABDOMEN PELVIS W CONTRAST  Result Date: 10/12/2023 CLINICAL DATA:  Inpatient. Abdominal pain, acute, nonlocalized. Rectal and cervical cancer. * Tracking Code: BO * EXAM: CT ABDOMEN AND PELVIS WITH CONTRAST TECHNIQUE: Multidetector CT imaging of the abdomen and pelvis was performed using the standard protocol following bolus administration of intravenous contrast. RADIATION DOSE REDUCTION: This exam was performed according to the departmental dose-optimization program which includes automated exposure control, adjustment of the mA and/or kV according to patient size and/or use of iterative reconstruction technique. CONTRAST:  OMNIPAQUE IOHEXOL 300 MG/ML  SOLN COMPARISON:  01/21/2022 CT chest, abdomen and pelvis. 04/22/2023 CT pelvis. FINDINGS: Lower chest: Tip of superior approach central venous catheter is seen at the cavoatrial junction. Hepatobiliary: Normal liver size. No liver mass. Moderately distended gallbladder. No radiopaque cholelithiasis. No gallbladder wall thickening. No biliary ductal dilatation. Pancreas: Normal, with no mass or duct dilation. Spleen: Normal size. No mass. Adrenals/Urinary Tract: Normal adrenals. Moderate asymmetric right renal atrophy with multifocal renal cortical scarring in the right kidney, unchanged. No  hydronephrosis. No renal masses. Bladder obscured by streak artifact from left hip hardware. Collapsed bladder with suggestion of mild diffuse bladder wall thickening. Stomach/Bowel: Normal non-distended stomach. Normal caliber small bowel with no small bowel wall thickening. Appendectomy. Diverting descending colon loop colostomy in the ventral left abdominal wall. Suggestion of a 5.4 x 3.4 cm irregular annular rectal mass, poorly visualized due to streak artifact from left hip hardware (series 2/image 70). Otherwise no large bowel wall thickening or significant diverticulosis. Vascular/Lymphatic: Atherosclerotic nonaneurysmal abdominal aorta. Patent portal, splenic, hepatic and renal veins. Mildly enlarged 1.1 cm right inguinal node (series 2/image 76), unchanged from 04/22/2023 CT. No new pathologically enlarged abdominopelvic nodes. Reproductive: Apparent hysterectomy.  No adnexal masses. Other: Small volume ascites, predominantly right pericolic gutter and pelvic. No pneumoperitoneum. No focal fluid collection. Musculoskeletal: No aggressive appearing focal osseous lesions. Mild thoracolumbar spondylosis. Left total hip arthroplasty. IMPRESSION: 1. No evidence of bowel obstruction or acute bowel inflammation. 2. Suggestion of a 5.4 x 3.4 cm irregular annular rectal mass, poorly visualized due to streak artifact from left hip hardware. Viable rectal tumor not excluded. 3. Small volume ascites, predominantly right paracolic gutter and pelvic. 4. Mildly enlarged right inguinal node, unchanged from 04/22/2023 CT. No new pathologically enlarged abdominopelvic nodes. 5. Collapsed bladder with suggestion of mild diffuse bladder wall thickening, nonspecific, cannot exclude cystitis. Suggest correlation with urinalysis. No hydronephrosis. 6. Moderately distended gallbladder, nonspecific. No  radiopaque cholelithiasis. No gallbladder wall thickening. No biliary ductal dilatation. 7. Chronic moderate asymmetric right renal  atrophy. 8.  Aortic Atherosclerosis (ICD10-I70.0). Electronically Signed   By: Delbert Phenix M.D.   On: 10/12/2023 19:40     Labs:   Basic Metabolic Panel: Recent Labs  Lab 10/12/23 1206 10/13/23 0103 10/14/23 0426  NA 137 141 138  K 4.1 3.5 3.3*  CL 103 107 106  CO2 24 22 22   GLUCOSE 138* 81 201*  BUN 22 23 35*  CREATININE 1.05* 0.99 1.09*  CALCIUM 8.8* 8.2* 7.9*   GFR Estimated Creatinine Clearance: 35.5 mL/min (A) (by C-G formula based on SCr of 1.09 mg/dL (H)). Liver Function Tests: Recent Labs  Lab 10/12/23 1206 10/14/23 0426  AST 19 13*  ALT 12 9  ALKPHOS 121 71  BILITOT 1.0 0.7  PROT 6.4* 5.6*  ALBUMIN 3.3* 2.5*   Recent Labs  Lab 10/12/23 1221  LIPASE 27   No results for input(s): "AMMONIA" in the last 168 hours. Coagulation profile Recent Labs  Lab 10/13/23 0103  INR 1.2    CBC: Recent Labs  Lab 10/12/23 1206 10/13/23 0103 10/13/23 0951 10/13/23 1746 10/14/23 0426  WBC 9.7 5.7  --   --  6.5  HGB 10.3* 9.0* 10.0* 10.4* 9.1*  HCT 34.1* 31.1* 33.2* 34.3* 30.0*  MCV 92.2 94.2  --   --  90.6  PLT 222 196  --   --  160   Cardiac Enzymes: No results for input(s): "CKTOTAL", "CKMB", "CKMBINDEX", "TROPONINI" in the last 168 hours. BNP: Invalid input(s): "POCBNP" CBG: Recent Labs  Lab 10/13/23 1134 10/13/23 1611 10/13/23 2025 10/14/23 0736 10/14/23 1122  GLUCAP 94 90 196* 172* 132*   D-Dimer No results for input(s): "DDIMER" in the last 72 hours. Hgb A1c No results for input(s): "HGBA1C" in the last 72 hours. Lipid Profile No results for input(s): "CHOL", "HDL", "LDLCALC", "TRIG", "CHOLHDL", "LDLDIRECT" in the last 72 hours. Thyroid function studies No results for input(s): "TSH", "T4TOTAL", "T3FREE", "THYROIDAB" in the last 72 hours.  Invalid input(s): "FREET3" Anemia work up No results for input(s): "VITAMINB12", "FOLATE", "FERRITIN", "TIBC", "IRON", "RETICCTPCT" in the last 72 hours. Microbiology No results found for this or  any previous visit (from the past 240 hour(s)).   Discharge Instructions:   Discharge Instructions     Call MD for:  persistant nausea and vomiting   Complete by: As directed    Call MD for:  severe uncontrolled pain   Complete by: As directed    Call MD for:  temperature >100.4   Complete by: As directed    Diet Carb Modified   Complete by: As directed    Soft diet, low fat diet   Discharge instructions   Complete by: As directed    Follow up with your primary care provider in one week. Check labs in the next visit.  Follow-up with medical oncology as outpatient, as scheduled by the cancer center.. Follow up with GI as outpatient in 3 to 4 weeks.  Seek medical attention for worsening symptoms.   Increase activity slowly   Complete by: As directed       Allergies as of 10/14/2023       Reactions   Codeine Nausea Only        Medication List     TAKE these medications    acetaminophen 500 MG tablet Commonly known as: TYLENOL Take 500 mg by mouth daily as needed for moderate pain (pain score 4-6)  or headache.   ascorbic acid 500 MG tablet Commonly known as: VITAMIN C Take 1 tablet (500 mg total) by mouth daily.   atorvastatin 40 MG tablet Commonly known as: LIPITOR Take 40 mg by mouth at bedtime.   baclofen 10 MG tablet Commonly known as: LIORESAL Take 20 mg by mouth 2 (two) times daily.   clopidogrel 75 MG tablet Commonly known as: PLAVIX Take 1 tablet (75 mg total) by mouth at bedtime. What changed: when to take this   gabapentin 300 MG capsule Commonly known as: NEURONTIN Take 300-600 mg by mouth See admin instructions. Take one capsule (300 mg) by mouth every morning and two capsules (600 mg) at night   gemfibrozil 600 MG tablet Commonly known as: LOPID Take 600 mg by mouth 2 (two) times daily.   Leg Cramps Tabs Take 1 tablet by mouth 2 (two) times daily as needed (leg pain).   levothyroxine 137 MCG tablet Commonly known as: SYNTHROID Take 137  mcg by mouth daily before breakfast.   MAGnesium-Oxide 400 (240 Mg) MG tablet Generic drug: magnesium oxide Take 1 tablet by mouth twice daily   MELATONIN PO Take 1 tablet by mouth at bedtime.   metFORMIN 500 MG tablet Commonly known as: GLUCOPHAGE Take 500 mg by mouth 2 (two) times daily with a meal.   nystatin powder Commonly known as: MYCOSTATIN/NYSTOP Apply 1 Application topically 2 (two) times daily. To groin rash What changed:  when to take this reasons to take this additional instructions   pantoprazole 40 MG tablet Commonly known as: PROTONIX Take 1 tablet (40 mg total) by mouth 2 (two) times daily before a meal for 70 days, THEN 1 tablet (40 mg total) daily. Start taking on: October 14, 2023 What changed: See the new instructions.   sertraline 50 MG tablet Commonly known as: ZOLOFT Take 50 mg by mouth at bedtime.   sucralfate 1 GM/10ML suspension Commonly known as: CARAFATE Take 10 mLs (1 g total) by mouth 4 (four) times daily -  with meals and at bedtime for 28 days.   Xtampza ER 9 MG C12a Generic drug: oxyCODONE ER Take 9 mg by mouth in the morning and at bedtime.        Follow-up Information     Lewis Moccasin, MD Follow up in 1 week(s).   Specialty: Family Medicine Contact information: [address] [place] [address] [phone]         Ladene Artist, MD. Go to.   Specialty: Oncology Why: when scheduled by the clinic Contact information: [address] [place] [address] [phone]         Imogene Burn, MD. Call in 4 week(s).   Specialty: Gastroenterology Why: Follow-up of GI bleed, duodenal ulcer Contact information: [address] [place] [address] [phone]                  Time coordinating discharge: 39 minutes  Signed:  Inesha Sow  [facility] 10/14/2023, 4:27 PM

## 2023-10-14 NOTE — Progress Notes (Signed)
Pharmacy Antibiotic Note  [name] is a 77 y.o. female admitted on 10/14/2023 with  abdominal pain, vomiting.  She was discharged in the hospital earlier today after admission for vomiting with upper GI bleeding.  She underwent upper endoscopy, HIDA scan, right upper quadrant ultrasound showing a distended gallbladder but no acute cholecystitis.  She states that she continues to have pain.  She arrives with fever but was unaware of the symptoms.  She is incontinent at baseline. .  Pharmacy has been consulted to dose cefepime for UTI  Plan: Cefepime 2gm IV q12h Follow renal function, cultures and clinical course  Height: 5' (152.4 cm) Weight: 61.7 kg (136 lb 0.4 oz) IBW/kg (Calculated) : 45.5  Temp (24hrs), Avg:99.4 F (37.4 C), Min:98.6 F (37 C), Max:101 F (38.3 C)  Recent Labs  Lab 10/12/23 1206 10/13/23 0103 10/14/23 0426 10/14/23 2025 10/14/23 2033  WBC 9.7 5.7 6.5 13.0*  --   CREATININE 1.05* 0.99 1.09* 1.01*  --   LATICACIDVEN  --   --   --   --  1.5    Estimated Creatinine Clearance: 38.3 mL/min (A) (by C-G formula based on SCr of 1.01 mg/dL (H)).    Allergies  Allergen Reactions   Codeine Nausea Only     Thank you for allowing pharmacy to be a part of this patient's care. Arley Phenix RPh 10/14/2023, 11:53 PM

## 2023-10-15 DIAGNOSIS — K279 Peptic ulcer, site unspecified, unspecified as acute or chronic, without hemorrhage or perforation: Secondary | ICD-10-CM

## 2023-10-15 LAB — GLUCOSE, CAPILLARY
Glucose-Capillary: 141 mg/dL — ABNORMAL HIGH (ref 70–99)
Glucose-Capillary: 144 mg/dL — ABNORMAL HIGH (ref 70–99)
Glucose-Capillary: 165 mg/dL — ABNORMAL HIGH (ref 70–99)
Glucose-Capillary: 166 mg/dL — ABNORMAL HIGH (ref 70–99)
Glucose-Capillary: 193 mg/dL — ABNORMAL HIGH (ref 70–99)
Glucose-Capillary: 207 mg/dL — ABNORMAL HIGH (ref 70–99)

## 2023-10-15 LAB — CBC
HCT: 34.8 % — ABNORMAL LOW (ref 36.0–46.0)
Hemoglobin: 10.6 g/dL — ABNORMAL LOW (ref 12.0–15.0)
MCH: 27.8 pg (ref 26.0–34.0)
MCHC: 30.5 g/dL (ref 30.0–36.0)
MCV: 91.3 fL (ref 80.0–100.0)
Platelets: 217 10*3/uL (ref 150–400)
RBC: 3.81 MIL/uL — ABNORMAL LOW (ref 3.87–5.11)
RDW: 15.8 % — ABNORMAL HIGH (ref 11.5–15.5)
WBC: 13.4 10*3/uL — ABNORMAL HIGH (ref 4.0–10.5)
nRBC: 0 % (ref 0.0–0.2)

## 2023-10-15 LAB — BASIC METABOLIC PANEL
Anion gap: 11 (ref 5–15)
BUN: 33 mg/dL — ABNORMAL HIGH (ref 8–23)
CO2: 20 mmol/L — ABNORMAL LOW (ref 22–32)
Calcium: 8.1 mg/dL — ABNORMAL LOW (ref 8.9–10.3)
Chloride: 98 mmol/L (ref 98–111)
Creatinine, Ser: 0.95 mg/dL (ref 0.44–1.00)
GFR, Estimated: >60 mL/min (ref >60)
Glucose, Bld: 147 mg/dL — ABNORMAL HIGH (ref 70–99)
Potassium: 4.3 mmol/L (ref 3.5–5.1)
Sodium: 129 mmol/L — ABNORMAL LOW (ref 135–145)

## 2023-10-15 MED ORDER — SODIUM CHLORIDE 0.9% FLUSH
10.0000 mL | Freq: Two times a day (BID) | INTRAVENOUS | Status: DC
  Administered 2023-10-15 – 2023-10-19 (×3): 10 mL

## 2023-10-15 MED ORDER — CHLORHEXIDINE GLUCONATE CLOTH 2 % EX PADS
6.0000 | MEDICATED_PAD | Freq: Every day | CUTANEOUS | Status: DC
  Administered 2023-10-15 – 2023-10-19 (×5): 6 via TOPICAL

## 2023-10-15 MED ORDER — SODIUM CHLORIDE 0.9% FLUSH: 10.0000 mL | INTRAVENOUS | Status: DC | PRN

## 2023-10-15 NOTE — Progress Notes (Signed)
Attempted to give patient her medications but she is too lethargic and barely able to stay awake. Medications were wasted in stericycle, witnessed by [facility], Charity fundraiser.

## 2023-10-15 NOTE — Plan of Care (Signed)
  Problem: Education: Goal: Ability to describe self-care measures that may prevent or decrease complications (Diabetes Survival Skills Education) will improve Outcome: Progressing Goal: Individualized Educational Video(s) Outcome: Progressing   Problem: Coping: Goal: Ability to adjust to condition or change in health will improve Outcome: Progressing   

## 2023-10-15 NOTE — H&P (Deleted)
Transition of Care [facility]) - Inpatient Brief Assessment   Patient Details  Name: [name] MRN: [id number] Date of Birth: 1946-03-05  Transition of [facility]) CM/SW Contact:    Darleene Cleaver, LCSW Phone Number: 10/15/2023, 9:51 AM   Clinical Narrative:  Patient triggered an SDOH for housing.  After reviewing chart patient lives with her spouse, and her daughter stays with her as well.  Patient has insurance and also has a PCP.  Patient does not have any other TOC needs anticipated at this time.  Transition of Care Asessment: Insurance and Status: Insurance coverage has been reviewed Patient has primary care physician: Yes Home environment has been reviewed: Yes Prior level of function:: Indep Prior/Current Home Services: No current home services Social Determinants of Health Reivew: SDOH reviewed interventions complete Readmission risk has been reviewed: Yes Transition of care needs: no transition of care needs at this time

## 2023-10-15 NOTE — Progress Notes (Signed)
Pharmacy Antibiotic Note  [name] is a 77 y.o. female admitted on 10/14/2023 with UTI.  Pharmacy has been consulted for Cefepime dosing.  Active Problem(s):  Abd pain, suprapubic N/V  PMH: rectal adenocarcinoma status post colectomy, recurrent cervical adenocarcinoma, history of CVA, CAD, type 2 diabetes mellitus, hypothyroidism, chronic pain, chronic anemia,   Significant events:  11/25-11/27 discharged in the hospital earlier today after admission for vomiting with upper GI bleeding. She underwent upper endoscopy, HIDA scan, right upper quadrant ultrasound showing a distended gallbladder but no acute cholecystitis. She states that she continues to have pain. She arrives with fever but was unaware of the symptoms. She is incontinent at baseline.   ID: UTI based on UA - Urine has previously grown Pseudomonas and citrobacter resistant to ceftriaxone  - Tmax 101, WBC 13.4. Scr <1  11/28: Cefepime>>  Plan: Con't Cefepime 2g IV q12 for CrCl <60 Pharmacy will sign off. Please reconsult for further dosing assitance.     Height: 5' (152.4 cm) Weight: 68.3 kg (150 lb 9.6 oz) IBW/kg (Calculated) : 45.5  Temp (24hrs), Avg:99.1 F (37.3 C), Min:98.3 F (36.8 C), Max:101 F (38.3 C)  Recent Labs  Lab 10/12/23 1206 10/13/23 0103 10/14/23 0426 10/14/23 2025 10/14/23 2033 10/15/23 0509  WBC 9.7 5.7 6.5 13.0*  --  13.4*  CREATININE 1.05* 0.99 1.09* 1.01*  --  0.95  LATICACIDVEN  --   --   --   --  1.5  --     Estimated Creatinine Clearance: 42.7 mL/min (by C-G formula based on SCr of 0.95 mg/dL).    Allergies  Allergen Reactions   Codeine Nausea Only    Tristyn Demarest S. Merilynn Finland, PharmD, BCPS Clinical Staff Pharmacist [email or web address] Pasty Spillers 10/15/2023 1:02 PM

## 2023-10-15 NOTE — Progress Notes (Signed)
PROGRESS NOTE    [name]  ZOX:[id number] DOB: [birth year] DOA: 10/14/2023 PCP: Lewis Moccasin, MD  Chief Complaint  Patient presents with   Weakness    Hospital Course:  [name] is 77 y.o. female with rectal adenocarcinoma status post colectomy, recurrent cervical adenocarcinoma, history of CVA, CAD, type 2 diabetes, hypothyroidism, chronic pain, chronic anemia, and recent admission for abdominal pain and GI bleed discharged on 11/27.  She reports she was feeling well at time of discharge but when she returned home began experiencing nausea, vomiting, suprapubic pain.  She Leander Rams presented to the ED.  On arrival she was found to be febrile with a leukocytosis of 13.  Blood and urine cultures were collected and patient was admitted for further workup.  Subjective: On evaluation this morning patient is complaining of lower abdominal pain.  Daughter is at bedside and endorses some concern about her mom's overall deconditioning.  She reports prior to her recent hospitalization she was largely independent.  Objective: Vitals:   10/14/23 2100 10/15/23 0041 10/15/23 0115 10/15/23 0508  BP: (!) 153/54 (!) 157/63  (!) 166/59  Pulse: 77 83  88  Resp: (!) 21 16  18   Temp: 98.6 F (37 C) 98.3 F (36.8 C)  99 F (37.2 C)  TempSrc: Oral Oral  Oral  SpO2: 97% 96%  97%  Weight:   68.3 kg   Height:        Intake/Output Summary (Last 24 hours) at 10/15/2023 0717 Last data filed at 10/15/2023 8119 Gross per 24 hour  Intake 900 ml  Output 50 ml  Net 850 ml   Filed Weights   10/14/23 2018 10/15/23 0115  Weight: 61.7 kg 68.3 kg    Examination: General exam: Appears calm and comfortable, drowsy Respiratory system: No work of breathing, symmetric chest wall expansion Cardiovascular system: S1 & S2 heard, RRR.  Gastrointestinal system: Ostomy noted.  Abdomen is nondistended, tender to palpation in suprapubic region. Neuro: Drowsy but arousable, oriented. No focal  neurological deficits. Extremities: Symmetric, expected ROM Skin: No rashes, lesions Psychiatry: Demonstrates appropriate judgement and insight. Mood & affect appropriate for situation.   GU: amber Colored urine in the canister.  Assessment & Plan:  Principal Problem:   Acute UTI Active Problems:   History of CVA (cerebrovascular accident)   Rectal cancer (HCC)   Hypothyroidism   DMII (diabetes mellitus, type 2) (HCC)   Essential hypertension   Coronary artery disease   Chronic diastolic CHF (congestive heart failure) (HCC)   Upper GI bleed   PUD (peptic ulcer disease)  UTI - Presents with fever leukocytosis and suprapubic pain.  UA compatible with infection - Previous urine cultures positive for Pseudomonas and Citrobacter resistant to ceftriaxone - Follow blood and urine cultures - Currently voiding freely, concerns for retention at this time.  Bladder scan if output slows - Proceed with cefepime for now, de-escalate as able  Recent GI bleed Peptic ulcer disease Erosive gastritis Schatzki ring Duodenal stenosis -Status post EGD on 11/26 - No active bleeding at this time - Trend hemoglobin - Continue PPI and Carafate - Plavix has been resumed, will follow closely.  CAD - Appears stable with no anginal complaints - Continue statin and Plavix for now  Type 2 diabetes, with hyperglycemia - Hemoglobin A1c 5.7% - Continue sliding scale insulin titrate up as tolerated  History of CVA - Stable.  At baseline.  Continue statin and Plavix as above  Hypothyroidism - Continue home dose Synthroid  Chronic pain - Continue home regimen for now  History of rectal and cervical cancer - Not currently undergoing active treatment - Follow-up with outpatient heme onco  BMI 29 - Outpatient follow up for lifestyle modification and risk factor management  Depression - Continue home dose Zoloft  Hyperlipidemia - Continue statin  Chronic anemia - Hemoglobin baseline  appears to be 9-10 - Currently stable - Monitor closely given resumption of Plavix.  Hyponatremia 130 on arrival-downtrending to 129 this morning - Continue to follow CMP - IV fluids as indicated  Elevated blood pressure without history of hypertension - BP may be elevated in setting of acute pain. - Follow closely - Initiate amlodipine if continuing to uptrend  Generalized deconditioning - PT/OT eval's pending. - Patient's daughter reports patient will not consider rehab at discharge.  Will likely go home with home health PT/OT pending AMPAC scores      DVT prophylaxis: Lovenox Code Status: Full Family Communication: Daughter present at bedside, care plan discussed Disposition:  Status is: Inpatient, ongoing IV abx, likely HH at DC   Consultants:  none  Procedures:  none  Antimicrobials:  Anti-infectives (From admission, onward)    Start     Dose/Rate Route Frequency Ordered Stop   10/15/23 0600  ceFEPIme (MAXIPIME) 2 g in sodium chloride 0.9 % 100 mL IVPB        2 g 200 mL/hr over 30 Minutes Intravenous Every 12 hours 10/14/23 2352     10/14/23 2130  cefTRIAXone (ROCEPHIN) 1 g in sodium chloride 0.9 % 100 mL IVPB        1 g 200 mL/hr over 30 Minutes Intravenous  Once 10/14/23 2128 10/14/23 2233       Data Reviewed: I have personally reviewed following labs and imaging studies CBC: Recent Labs  Lab 10/12/23 1206 10/13/23 0103 10/13/23 0951 10/13/23 1746 10/14/23 0426 10/14/23 2025 10/15/23 0509  WBC 9.7 5.7  --   --  6.5 13.0* 13.4*  NEUTROABS  --   --   --   --   --  12.0*  --   HGB 10.3* 9.0* 10.0* 10.4* 9.1* 10.9* 10.6*  HCT 34.1* 31.1* 33.2* 34.3* 30.0* 35.6* 34.8*  MCV 92.2 94.2  --   --  90.6 90.6 91.3  PLT 222 196  --   --  160 223 217   Basic Metabolic Panel: Recent Labs  Lab 10/12/23 1206 10/13/23 0103 10/14/23 0426 10/14/23 2025 10/15/23 0509  NA 137 141 138 130* 129*  K 4.1 3.5 3.3* 4.5 4.3  CL 103 107 106 100 98  CO2 24 22 22   18* 20*  GLUCOSE 138* 81 201* 246* 147*  BUN 22 23 35* 33* 33*  CREATININE 1.05* 0.99 1.09* 1.01* 0.95  CALCIUM 8.8* 8.2* 7.9* 8.3* 8.1*   GFR: Estimated Creatinine Clearance: 42.7 mL/min (by C-G formula based on SCr of 0.95 mg/dL). Liver Function Tests: Recent Labs  Lab 10/12/23 1206 10/14/23 0426 10/14/23 2025  AST 19 13* 14*  ALT 12 9 8   ALKPHOS 121 71 83  BILITOT 1.0 0.7 1.3*  PROT 6.4* 5.6* 6.1*  ALBUMIN 3.3* 2.5* 2.9*   CBG: Recent Labs  Lab 10/13/23 1611 10/13/23 2025 10/14/23 0736 10/14/23 1122 10/15/23 0048  GLUCAP 90 196* 172* 132* 166*    Recent Results (from the past 240 hour(s))  Resp panel by RT-PCR (RSV, Flu A&B, Covid) Anterior Nasal Swab     Status: None   Collection Time: 10/14/23  8:52 PM  Specimen: Anterior Nasal Swab  Result Value Ref Range Status   SARS Coronavirus 2 by RT PCR NEGATIVE NEGATIVE Final    Comment: (NOTE) SARS-CoV-2 target nucleic acids are NOT DETECTED.  The SARS-CoV-2 RNA is generally detectable in upper respiratory specimens during the acute phase of infection. The lowest concentration of SARS-CoV-2 viral copies this assay can detect is 138 copies/mL. A negative result does not preclude SARS-Cov-2 infection and should not be used as the sole basis for treatment or other patient management decisions. A negative result may occur with  improper specimen collection/handling, submission of specimen other than nasopharyngeal swab, presence of viral mutation(s) within the areas targeted by this assay, and inadequate number of viral copies(<138 copies/mL). A negative result must be combined with clinical observations, patient history, and epidemiological information. The expected result is Negative.  Fact Sheet for Patients:  BloggerCourse.com  Fact Sheet for Healthcare Providers:  SeriousBroker.it  This test is no t yet approved or cleared by the [place] FDA and  has  been authorized for detection and/or diagnosis of SARS-CoV-2 by FDA under an Emergency Use Authorization (EUA). This EUA will remain  in effect (meaning this test can be used) for the duration of the COVID-19 declaration under Section 564(b)(1) of the Act, 21 U.S.C.section 360bbb-3(b)(1), unless the authorization is terminated  or revoked sooner.       Influenza A by PCR NEGATIVE NEGATIVE Final   Influenza B by PCR NEGATIVE NEGATIVE Final    Comment: (NOTE) The Xpert Xpress SARS-CoV-2/FLU/RSV plus assay is intended as an aid in the diagnosis of influenza from Nasopharyngeal swab specimens and should not be used as a sole basis for treatment. Nasal washings and aspirates are unacceptable for Xpert Xpress SARS-CoV-2/FLU/RSV testing.  Fact Sheet for Patients: BloggerCourse.com  Fact Sheet for Healthcare Providers: SeriousBroker.it  This test is not yet approved or cleared by the [place] FDA and has been authorized for detection and/or diagnosis of SARS-CoV-2 by FDA under an Emergency Use Authorization (EUA). This EUA will remain in effect (meaning this test can be used) for the duration of the COVID-19 declaration under Section 564(b)(1) of the Act, 21 U.S.C. section 360bbb-3(b)(1), unless the authorization is terminated or revoked.     Resp Syncytial Virus by PCR NEGATIVE NEGATIVE Final    Comment: (NOTE) Fact Sheet for Patients: BloggerCourse.com  Fact Sheet for Healthcare Providers: SeriousBroker.it  This test is not yet approved or cleared by the [place] FDA and has been authorized for detection and/or diagnosis of SARS-CoV-2 by FDA under an Emergency Use Authorization (EUA). This EUA will remain in effect (meaning this test can be used) for the duration of the COVID-19 declaration under Section 564(b)(1) of the Act, 21 U.S.C. section 360bbb-3(b)(1), unless the  authorization is terminated or revoked.  Performed at [facility], [address], [place], [place] [address]      Radiology Studies: DG Chest Portable 1 View  Result Date: 10/14/2023 CLINICAL DATA:  Fever, cervical and rectal cancer EXAM: PORTABLE CHEST 1 VIEW COMPARISON:  Radiograph 09/10/2023 FINDINGS: Right chest wall Port-A-Cath tip in the right atrium. Stable cardiomediastinal silhouette. Aortic atherosclerotic calcification. No focal consolidation, pleural effusion, or pneumothorax. No displaced rib fractures. IMPRESSION: No active disease. Electronically Signed   By: Minerva Fester M.D.   On: 10/14/2023 21:15   US Abdomen Limited RUQ (LIVER/GB)  Result Date: 10/14/2023 CLINICAL DATA:  [id number] Abdominal pain [id number] EXAM: ULTRASOUND ABDOMEN LIMITED RIGHT UPPER QUADRANT COMPARISON:  HIDA scan 10/14/2023,  CT 10/12/2023 FINDINGS: Gallbladder: Gallbladder is distended measuring 13 cm in length. No significant wall thickening. No sludge or stones within the gallbladder. No sonographic Murphy sign noted by sonographer. Common bile duct: Not visualized. Liver: No focal lesion identified. Within normal limits in parenchymal echogenicity. Portal vein is patent on color Doppler imaging with normal direction of blood flow towards the liver. Other: None. Technical note: Sonographer describes a technically difficult study 2 2 poor penetration related to patient body habitus. Portions of the left hepatic lobe as well as the common bile duct were not able to be assessed. IMPRESSION: 1. Distended gallbladder without sonographic evidence of acute cholecystitis. 2. Nonvisualization of the common bile duct. Electronically Signed   By: Duanne Guess D.O.   On: 10/14/2023 16:02   NM Hepatobiliary Liver Func  Result Date: 10/14/2023 CLINICAL DATA:  Concern for acalculous cholecystitis. Biliary colic. EXAM: NUCLEAR MEDICINE HEPATOBILIARY IMAGING TECHNIQUE: Sequential images of the abdomen  were obtained out to 60 minutes following intravenous administration of radiopharmaceutical. RADIOPHARMACEUTICALS:  5.5 mCi Tc-64m  Choletec IV COMPARISON:  CT 10/12/2019 FINDINGS: Prompt clearance radiotracer from blood pool and homogeneous uptake in liver. Counts are evident in the small bowel 25 minutes. Small focus of activity is noted in the region the neck of the gallbladder at 50 minutes. This activity continues to expand over 2 Hours of imaging. Favor delayed filling of the gallbladder. IMPRESSION: 1. Patent common bile duct. 2. Delayed filling of the gallbladder. Ejection fraction could not be performed as only partial filling of the gallbladder at 2 hours imaging. Electronically Signed   By: Genevive Bi M.D.   On: 10/14/2023 11:19    Scheduled Meds:  atorvastatin  40 mg Oral QHS   baclofen  20 mg Oral BID   clopidogrel  75 mg Oral Daily   enoxaparin (LOVENOX) injection  40 mg Subcutaneous Q24H   gabapentin  300 mg Oral Daily   gabapentin  600 mg Oral QHS   insulin aspart  0-5 Units Subcutaneous QHS   insulin aspart  0-6 Units Subcutaneous TID WC   levothyroxine  137 mcg Oral Q0600   oxyCODONE  10 mg Oral Q12H   pantoprazole  40 mg Oral BID AC   Followed by   Melene Muller ON 12/23/2023] pantoprazole  40 mg Oral Daily   sertraline  50 mg Oral QHS   sucralfate  1 g Oral TID WC & HS   Continuous Infusions:  ceFEPime (MAXIPIME) IV 2 g (10/15/23 0543)     LOS: 1 day    Time spent:   Debarah Crape, DO [facility]  To contact the attending physician between 7A-7P please use Epic Chat. To contact the covering physician during after hours 7P-7A, please review Amion.   10/15/2023, 7:17 AM

## 2023-10-15 NOTE — ED Notes (Signed)
ED TO INPATIENT HANDOFF REPORT  Name/Age/Gender [name] 77 y.o. female  Code Status    Code Status Orders  (From admission, onward)           Start     Ordered   10/14/23 2347  Full code  Continuous       Question:  By:  Answer:  Consent: discussion documented in EHR   10/14/23 2350           Code Status History     Date Active Date Inactive Code Status Order ID Comments User Context   10/12/2023 1434 10/14/2023 2011 Full Code [id number]  Maryln Gottron, MD Inpatient   09/10/2023 2305 09/13/2023 1913 Full Code [id number]  Therisa Doyne, MD Inpatient   11/29/2021 1118 12/02/2021 2155 Full Code [id number]  Bobette Mo, MD ED   10/08/2021 1759 10/12/2021 1823 Full Code [id number]  Burnadette Pop, MD Inpatient   09/23/2021 1435 09/24/2021 2239 Full Code [id number]  Bobette Mo, MD Inpatient   08/25/2021 1021 08/28/2021 1748 Full Code [id number]  Bobette Mo, MD ED   01/18/2021 1854 01/24/2021 0118 Full Code [id number]  Teddy Spike, DO Inpatient   09/30/2020 1455 10/01/2020 2113 Full Code [id number]  Eliezer Bottom, MD ED   03/28/2020 1247 03/29/2020 1641 Full Code [id number]  Dara Lords, PA-C Inpatient   02/11/2020 0046 02/11/2020 2319 DNR [id number]  Dollene Cleveland, DO ED   12/12/2019 1804 12/15/2019 1840 Full Code [id number]  Jacques Navy, MD ED   11/29/2019 0758 12/02/2019 2208 Full Code [id number]  Bobette Mo, MD ED   07/27/2019 0044 07/27/2019 2013 Full Code [id number]  Charlsie Quest, MD ED   05/04/2018 0036 05/07/2018 1852 Full Code [id number]  Eduard Clos, MD ED       Home/SNF/Other Home  Chief Complaint Acute UTI [N39.0]  Level of Care/Admitting Diagnosis ED Disposition     ED Disposition  Admit   Condition  --   Comment  [facility] [[id number]]  Level of Care: Med-Surg [16]  May admit patient to [facility] or [facility] if equivalent level of care is available:: Yes  Covid  Evaluation: Confirmed COVID Negative  Diagnosis: Acute UTI [854627]  Admitting Physician: Briscoe Deutscher [[id number]]  Attending Physician: Briscoe Deutscher [[id number]]  Certification:: I certify this patient will need inpatient services for at least 2 midnights  Expected Medical Readiness: 10/16/2023          Medical History Past Medical History:  Diagnosis Date   Arthritis    Basal ganglia stroke [facility])    March 2022   Bilateral lower extremity edema    Cancer (HCC)    twice- colon cancer, unsure of the other type of cancer   Carotid artery occlusion    Flank pain    Full dentures    History of cervical cancer    11/ 1994  Stage IB  s/p  high dose radiation brachytherapy @ [facility] 01/ 1995--- per pt no recurrence   History of chronic bronchitis    History of hyperthyroidism    d 03/ 2011---due to grave's disease--- s/p  RAI treatment 04/ 2011   History of kidney stones    History of sepsis 05/03/2018   due to UTI with klebsiella/ pyelonephritis/ ureteral obstruction cause by stone   Hyperlipidemia    Hypothyroidism, postradioiodine therapy    endocrinologist--  dr Everardo All--  dx graves disease and s/p RAI i131 treatement  4/ 2011   Nauseated    Oral thrush 05/03/2018   Pneumonia    Type 2 diabetes mellitus treated with insulin (HCC)    FOLLOWED BY PCP   Urgency of urination    Wears glasses     Allergies Allergies  Allergen Reactions   Codeine Nausea Only    IV Location/Drains/Wounds Patient Lines/Drains/Airways Status     Active Line/Drains/Airways     Name Placement date Placement time Site Days   Implanted Port Right Chest --  --  Chest  --   Peripheral IV 10/14/23 20 G Anterior;Proximal;Right Forearm 10/14/23  2046  Forearm  1   Colostomy LLQ 10/08/21  1200  LLQ  737   Airway 09/11/23  1303  -- 34   Wound / Incision (Open or Dehisced) 09/10/23 Non-pressure wound;Other (Comment) Toe (Comment  which one) Right;Posterior Callous to Right great toe; 1cmx1.5cm  09/10/23  2310  Toe (Comment  which one)  35   Wound / Incision (Open or Dehisced) 09/10/23 Non-pressure wound;Other (Comment) Toe (Comment  which one) Left;Posterior Left Great toe callous 0.5cmx0.5cm 09/10/23  2300  Toe (Comment  which one)  35            Labs/Imaging Results for orders placed or performed during the hospital encounter of 10/14/23 (from the past 48 hour(s))  Comprehensive metabolic panel     Status: Abnormal   Collection Time: 10/14/23  8:25 PM  Result Value Ref Range   Sodium 130 (L) 135 - 145 mmol/L    Comment: DELTA CHECK NOTED   Potassium 4.5 3.5 - 5.1 mmol/L    Comment: DELTA CHECK NOTED   Chloride 100 98 - 111 mmol/L   CO2 18 (L) 22 - 32 mmol/L   Glucose, Bld 246 (H) 70 - 99 mg/dL    Comment: Glucose reference range applies only to samples taken after fasting for at least 8 hours.   BUN 33 (H) 8 - 23 mg/dL   Creatinine, Ser 2.53 (H) 0.44 - 1.00 mg/dL   Calcium 8.3 (L) 8.9 - 10.3 mg/dL   Total Protein 6.1 (L) 6.5 - 8.1 g/dL   Albumin 2.9 (L) 3.5 - 5.0 g/dL   AST 14 (L) 15 - 41 U/L   ALT 8 0 - 44 U/L   Alkaline Phosphatase 83 38 - 126 U/L   Total Bilirubin 1.3 (H) <1.2 mg/dL   GFR, Estimated 57 (L) >60 mL/min    Comment: (NOTE) Calculated using the CKD-EPI Creatinine Equation (2021)    Anion gap 12 5 - 15    Comment: Performed at [facility], [address], [place], [place] [address]  CBC with Differential     Status: Abnormal   Collection Time: 10/14/23  8:25 PM  Result Value Ref Range   WBC 13.0 (H) 4.0 - 10.5 K/uL   RBC 3.93 3.87 - 5.11 MIL/uL   Hemoglobin 10.9 (L) 12.0 - 15.0 g/dL   HCT 34.7 (L) 42.5 - 95.6 %   MCV 90.6 80.0 - 100.0 fL   MCH 27.7 26.0 - 34.0 pg   MCHC 30.6 30.0 - 36.0 g/dL   RDW 38.7 (H) 56.4 - 33.2 %   Platelets 223 150 - 400 K/uL   nRBC 0.0 0.0 - 0.2 %   Neutrophils Relative % 91 %   Neutro Abs 12.0 (H) 1.7 - 7.7 K/uL   Lymphocytes Relative 3 %   Lymphs Abs 0.3 (L) 0.7 - 4.0 K/uL   Monocytes  Relative  5 %   Monocytes Absolute 0.7 0.1 - 1.0 K/uL   Eosinophils Relative 0 %   Eosinophils Absolute 0.0 0.0 - 0.5 K/uL   Basophils Relative 0 %   Basophils Absolute 0.0 0.0 - 0.1 K/uL   Immature Granulocytes 1 %   Abs Immature Granulocytes 0.06 0.00 - 0.07 K/uL    Comment: Performed at [facility], [address], [place], [place] [address]  Protime-INR     Status: Abnormal   Collection Time: 10/14/23  8:25 PM  Result Value Ref Range   Prothrombin Time 17.1 (H) 11.4 - 15.2 seconds   INR 1.4 (H) 0.8 - 1.2    Comment: (NOTE) INR goal varies based on device and disease states. Performed at [facility], [address], [place], [place] [address]   I-Stat Lactic Acid, ED     Status: None   Collection Time: 10/14/23  8:33 PM  Result Value Ref Range   Lactic Acid, Venous 1.5 0.5 - 1.9 mmol/L  Resp panel by RT-PCR (RSV, Flu A&B, Covid) Anterior Nasal Swab     Status: None   Collection Time: 10/14/23  8:52 PM   Specimen: Anterior Nasal Swab  Result Value Ref Range   SARS Coronavirus 2 by RT PCR NEGATIVE NEGATIVE    Comment: (NOTE) SARS-CoV-2 target nucleic acids are NOT DETECTED.  The SARS-CoV-2 RNA is generally detectable in upper respiratory specimens during the acute phase of infection. The lowest concentration of SARS-CoV-2 viral copies this assay can detect is 138 copies/mL. A negative result does not preclude SARS-Cov-2 infection and should not be used as the sole basis for treatment or other patient management decisions. A negative result may occur with  improper specimen collection/handling, submission of specimen other than nasopharyngeal swab, presence of viral mutation(s) within the areas targeted by this assay, and inadequate number of viral copies(<138 copies/mL). A negative result must be combined with clinical observations, patient history, and epidemiological information. The expected result is Negative.  Fact Sheet for  Patients:  BloggerCourse.com  Fact Sheet for Healthcare Providers:  SeriousBroker.it  This test is no t yet approved or cleared by the [place] FDA and  has been authorized for detection and/or diagnosis of SARS-CoV-2 by FDA under an Emergency Use Authorization (EUA). This EUA will remain  in effect (meaning this test can be used) for the duration of the COVID-19 declaration under Section 564(b)(1) of the Act, 21 U.S.C.section 360bbb-3(b)(1), unless the authorization is terminated  or revoked sooner.       Influenza A by PCR NEGATIVE NEGATIVE   Influenza B by PCR NEGATIVE NEGATIVE    Comment: (NOTE) The Xpert Xpress SARS-CoV-2/FLU/RSV plus assay is intended as an aid in the diagnosis of influenza from Nasopharyngeal swab specimens and should not be used as a sole basis for treatment. Nasal washings and aspirates are unacceptable for Xpert Xpress SARS-CoV-2/FLU/RSV testing.  Fact Sheet for Patients: BloggerCourse.com  Fact Sheet for Healthcare Providers: SeriousBroker.it  This test is not yet approved or cleared by the [place] FDA and has been authorized for detection and/or diagnosis of SARS-CoV-2 by FDA under an Emergency Use Authorization (EUA). This EUA will remain in effect (meaning this test can be used) for the duration of the COVID-19 declaration under Section 564(b)(1) of the Act, 21 U.S.C. section 360bbb-3(b)(1), unless the authorization is terminated or revoked.     Resp Syncytial Virus by PCR NEGATIVE NEGATIVE    Comment: (NOTE) Fact Sheet for Patients: BloggerCourse.com  Fact Sheet for Healthcare Providers: SeriousBroker.it  This test is not yet approved or cleared by the [place] FDA and has been authorized for detection and/or diagnosis of SARS-CoV-2 by FDA under an Emergency Use Authorization  (EUA). This EUA will remain in effect (meaning this test can be used) for the duration of the COVID-19 declaration under Section 564(b)(1) of the Act, 21 U.S.C. section 360bbb-3(b)(1), unless the authorization is terminated or revoked.  Performed at [facility], [address], [place], [place] [address]   Urinalysis, w/ Reflex to Culture (Infection Suspected) -Urine, Clean Catch     Status: Abnormal   Collection Time: 10/14/23  9:42 PM  Result Value Ref Range   Specimen Source URINE, CATHETERIZED    Color, Urine AMBER (A) YELLOW    Comment: BIOCHEMICALS MAY BE AFFECTED BY COLOR   APPearance CLOUDY (A) CLEAR   Specific Gravity, Urine 1.020 1.005 - 1.030   pH 5.0 5.0 - 8.0   Glucose, UA NEGATIVE NEGATIVE mg/dL   Hgb urine dipstick LARGE (A) NEGATIVE   Bilirubin Urine NEGATIVE NEGATIVE   Ketones, ur NEGATIVE NEGATIVE mg/dL   Protein, ur 347 (A) NEGATIVE mg/dL   Nitrite POSITIVE (A) NEGATIVE   Leukocytes,Ua LARGE (A) NEGATIVE   RBC / HPF >50 0 - 5 RBC/hpf   WBC, UA >50 0 - 5 WBC/hpf    Comment:        Reflex urine culture not performed if WBC <=10, OR if Squamous epithelial cells >5. If Squamous epithelial cells >5 suggest recollection.    Bacteria, UA MANY (A) NONE SEEN   Squamous Epithelial / HPF 6-10 0 - 5 /HPF   WBC Clumps PRESENT    Hyaline Casts, UA PRESENT     Comment: Performed at [facility], [address], [place], [place] [address]   DG Chest Portable 1 View  Result Date: 10/14/2023 CLINICAL DATA:  Fever, cervical and rectal cancer EXAM: PORTABLE CHEST 1 VIEW COMPARISON:  Radiograph 09/10/2023 FINDINGS: Right chest wall Port-A-Cath tip in the right atrium. Stable cardiomediastinal silhouette. Aortic atherosclerotic calcification. No focal consolidation, pleural effusion, or pneumothorax. No displaced rib fractures. IMPRESSION: No active disease. Electronically Signed   By: Minerva Fester M.D.   On: 10/14/2023 21:15   US  Abdomen Limited RUQ (LIVER/GB)  Result Date: 10/14/2023 CLINICAL DATA:  [id number] Abdominal pain [id number] EXAM: ULTRASOUND ABDOMEN LIMITED RIGHT UPPER QUADRANT COMPARISON:  HIDA scan 10/14/2023, CT 10/12/2023 FINDINGS: Gallbladder: Gallbladder is distended measuring 13 cm in length. No significant wall thickening. No sludge or stones within the gallbladder. No sonographic Murphy sign noted by sonographer. Common bile duct: Not visualized. Liver: No focal lesion identified. Within normal limits in parenchymal echogenicity. Portal vein is patent on color Doppler imaging with normal direction of blood flow towards the liver. Other: None. Technical note: Sonographer describes a technically difficult study 2 2 poor penetration related to patient body habitus. Portions of the left hepatic lobe as well as the common bile duct were not able to be assessed. IMPRESSION: 1. Distended gallbladder without sonographic evidence of acute cholecystitis. 2. Nonvisualization of the common bile duct. Electronically Signed   By: Duanne Guess D.O.   On: 10/14/2023 16:02   NM Hepatobiliary Liver Func  Result Date: 10/14/2023 CLINICAL DATA:  Concern for acalculous cholecystitis. Biliary colic. EXAM: NUCLEAR MEDICINE HEPATOBILIARY IMAGING TECHNIQUE: Sequential images of the abdomen were obtained out to 60 minutes following intravenous administration of radiopharmaceutical. RADIOPHARMACEUTICALS:  5.5 mCi Tc-89m  Choletec IV  COMPARISON:  CT 10/12/2019 FINDINGS: Prompt clearance radiotracer from blood pool and homogeneous uptake in liver. Counts are evident in the small bowel 25 minutes. Small focus of activity is noted in the region the neck of the gallbladder at 50 minutes. This activity continues to expand over 2 Hours of imaging. Favor delayed filling of the gallbladder. IMPRESSION: 1. Patent common bile duct. 2. Delayed filling of the gallbladder. Ejection fraction could not be performed as only partial filling of the gallbladder  at 2 hours imaging. Electronically Signed   By: Genevive Bi M.D.   On: 10/14/2023 11:19    Pending Labs Unresulted Labs (From admission, onward)     Start     Ordered   10/21/23 0500  Creatinine, serum  (enoxaparin (LOVENOX)    CrCl >/= 30 ml/min)  Weekly,   R     Comments: while on enoxaparin therapy    10/14/23 2350   10/15/23 0500  Basic metabolic panel  Daily,   R      10/14/23 2350   10/15/23 0500  CBC  Daily,   R      10/14/23 2350   10/14/23 2121  Urine Culture  Once,   URGENT       Question:  Indication  Answer:  Altered mental status (if no other cause identified)   10/14/23 2120   10/14/23 2020  Culture, blood (Routine x 2)  BLOOD CULTURE X 2,   R (with STAT occurrences)      10/14/23 2019            Vitals/Pain Today's Vitals   10/14/23 2018 10/14/23 2020 10/14/23 2100 10/14/23 2154  BP:  (!) 147/52 (!) 153/54   Pulse:  83 77   Resp:  (!) 25 (!) 21   Temp:  (!) 101 F (38.3 C) 98.6 F (37 C)   TempSrc:   Oral   SpO2:  97% 97%   Weight: 61.7 kg     Height: 5' (1.524 m)     PainSc: 8    Asleep    Isolation Precautions No active isolations  Medications Medications  oxyCODONE (OXYCONTIN) 12 hr tablet 10 mg (has no administration in time range)  atorvastatin (LIPITOR) tablet 40 mg (has no administration in time range)  sertraline (ZOLOFT) tablet 50 mg (has no administration in time range)  levothyroxine (SYNTHROID) tablet 137 mcg (has no administration in time range)  pantoprazole (PROTONIX) EC tablet 40 mg (has no administration in time range)    Followed by  pantoprazole (PROTONIX) EC tablet 40 mg (has no administration in time range)  sucralfate (CARAFATE) 1 GM/10ML suspension 1 g (has no administration in time range)  clopidogrel (PLAVIX) tablet 75 mg (has no administration in time range)  baclofen (LIORESAL) tablet 20 mg (has no administration in time range)  gabapentin (NEURONTIN) capsule 600 mg (has no administration in time range)  insulin  aspart (novoLOG) injection 0-6 Units (has no administration in time range)  insulin aspart (novoLOG) injection 0-5 Units (has no administration in time range)  enoxaparin (LOVENOX) injection 40 mg (has no administration in time range)  acetaminophen (TYLENOL) tablet 650 mg (has no administration in time range)    Or  acetaminophen (TYLENOL) suppository 650 mg (has no administration in time range)  senna-docusate (Senokot-S) tablet 1 tablet (has no administration in time range)  ondansetron (ZOFRAN) tablet 4 mg (has no administration in time range)    Or  ondansetron (ZOFRAN) injection 4 mg (has no administration in  time range)  melatonin tablet 3 mg (has no administration in time range)  ceFEPIme (MAXIPIME) 2 g in sodium chloride 0.9 % 100 mL IVPB (has no administration in time range)  gabapentin (NEURONTIN) capsule 300 mg (has no administration in time range)  sodium chloride 0.9 % bolus 500 mL (0 mLs Intravenous Stopped 10/14/23 2155)  morphine (PF) 4 MG/ML injection 4 mg (4 mg Intravenous Given 10/14/23 2046)  ondansetron (ZOFRAN) injection 4 mg (4 mg Intravenous Given 10/14/23 2046)  cefTRIAXone (ROCEPHIN) 1 g in sodium chloride 0.9 % 100 mL IVPB (0 g Intravenous Stopped 10/14/23 2233)    Mobility walks with person assist

## 2023-10-16 ENCOUNTER — Encounter (HOSPITAL_COMMUNITY): Payer: Self-pay | Admitting: Internal Medicine

## 2023-10-16 LAB — GLUCOSE, CAPILLARY
Glucose-Capillary: 145 mg/dL — ABNORMAL HIGH (ref 70–99)
Glucose-Capillary: 144 mg/dL — ABNORMAL HIGH (ref 70–99)
Glucose-Capillary: 380 mg/dL — ABNORMAL HIGH (ref 70–99)
Glucose-Capillary: 216 mg/dL — ABNORMAL HIGH (ref 70–99)

## 2023-10-16 LAB — CBC
HCT: 29.8 % — ABNORMAL LOW (ref 36.0–46.0)
Hemoglobin: 9.2 g/dL — ABNORMAL LOW (ref 12.0–15.0)
MCH: 27.5 pg (ref 26.0–34.0)
MCHC: 30.9 g/dL (ref 30.0–36.0)
MCV: 89 fL (ref 80.0–100.0)
Platelets: 181 10*3/uL (ref 150–400)
RBC: 3.35 MIL/uL — ABNORMAL LOW (ref 3.87–5.11)
RDW: 15.4 % (ref 11.5–15.5)
WBC: 10.3 10*3/uL (ref 4.0–10.5)
nRBC: 0 % (ref 0.0–0.2)

## 2023-10-16 LAB — BASIC METABOLIC PANEL
Anion gap: 9 (ref 5–15)
BUN: 30 mg/dL — ABNORMAL HIGH (ref 8–23)
CO2: 20 mmol/L — ABNORMAL LOW (ref 22–32)
Calcium: 7.9 mg/dL — ABNORMAL LOW (ref 8.9–10.3)
Chloride: 101 mmol/L (ref 98–111)
Creatinine, Ser: 0.95 mg/dL (ref 0.44–1.00)
GFR, Estimated: >60 mL/min (ref >60)
Glucose, Bld: 153 mg/dL — ABNORMAL HIGH (ref 70–99)
Potassium: 3.8 mmol/L (ref 3.5–5.1)
Sodium: 130 mmol/L — ABNORMAL LOW (ref 135–145)

## 2023-10-16 NOTE — Plan of Care (Signed)
  Problem: Skin Integrity: Goal: Risk for impaired skin integrity will decrease Outcome: Progressing   Problem: Tissue Perfusion: Goal: Adequacy of tissue perfusion will improve Outcome: Progressing   

## 2023-10-16 NOTE — TOC Progression Note (Addendum)
Transition of Care [facility]) - Progression Note    Patient Details  Name: JACQULENE CLARIDGE MRN: [id number] Date of Birth: 07-May-1946  Transition of Care [facility]) CM/SW Contact  Howell Rucks, RN Phone Number: 10/16/2023, 10:27 AM  Clinical Narrative:  Met with pt at bedside, Introduced role of TOC and reviewed for dc planning, MD order for [facility] PT/OT/HHA,, Pt reports she resides with her spouse and that her dtr assist in her care,  pt agreeable , no preference. Pt gave verbal ok to speak with her dtr Cala Bradford) NCM outreached to pt's dtr Cala Bradford), introduced role of TOC/NCM, agreeable to [facility]  PT/OT/HHA for pt, no preference, reports she will be at hospital later today to visit pt. TOC will arrange HH. PT eval, await recommendation.  TOC will continue to follow.   -10:33am [facility], rep-Kelly accepted for [facility] PT/OT/HHA, added to AVS.             Expected Discharge Plan and Services                                               Social Determinants of Health (SDOH) Interventions SDOH Screenings   Food Insecurity: Patient Unable To Answer (10/14/2023)  Housing: Low Risk  (10/15/2023)  Recent Concern: Housing - High Risk (10/14/2023)  Transportation Needs: Patient Unable To Answer (10/14/2023)  Utilities: Patient Unable To Answer (10/14/2023)  Depression (PHQ2-9): Low Risk  (01/17/2020)  Tobacco Use: Medium Risk (10/14/2023)    Readmission Risk Interventions    10/15/2023    9:49 AM 09/11/2023   11:36 AM 12/02/2021    1:55 PM  Readmission Risk Prevention Plan  Transportation Screening Complete Complete Complete  PCP or Specialist Appt within 3-5 Days  Complete   HRI or Home Care Consult  Complete   Social Work Consult for Recovery Care Planning/Counseling  Complete   Palliative Care Screening  Not Applicable   Medication Review Oceanographer) Referral to Pharmacy Complete Complete  PCP or Specialist appointment within 3-5 days of discharge Complete  Complete   HRI or Home Care Consult Complete  Complete  SW Recovery Care/Counseling Consult Complete  Complete  Palliative Care Screening Not Applicable  Not Applicable  Skilled Nursing Facility Not Applicable  Patient Refused

## 2023-10-16 NOTE — Evaluation (Signed)
Physical Therapy Evaluation Patient Details Name: [name] MRN: [id number] DOB: 31-Jul-1946 Today's Date: 10/16/2023  History of Present Illness  [name] is a 77 yr old female admitted to the hospital 10-14-23 with suprapubic pain, nausea, vomiting, and fatigue. She was found to have UTI. PMH: rectal CA s/p colectomy, recurrent cervical adenocarcinoma, CVA, CAD, DM II, chronic pain, chronic anemia, arthritis, L THA  Clinical Impression  Pt admitted as above and presenting with functional mobility limitations 2* generalized weakness (including residual R side weakness from prior CVA), ambulatory balance deficits and decreased activity tolerance.   During the session today, she required min to mod assist for supine to sit, sit to stand using a RW, and for ambulating using a RW. She reported a history of CVA causing RUE and RLE weakness, as well as RUE fine motor coordination deficits.  She will benefit from further PT services to maximize her safety and independence with mobility. Pt stated she has a supportive daughter who can stay with her for a few days upon discharge, as well as assist her as needed. If her daughter is able to do so, then pt can return home with home health PT. If her daughter/family is unable to do so, then short-term SNF rehab is recommended.         If plan is discharge home, recommend the following: A little help with walking and/or transfers;A little help with bathing/dressing/bathroom;Assistance with cooking/housework;Assist for transportation;Help with stairs or ramp for entrance   Can travel by private vehicle        Equipment Recommendations None recommended by PT  Recommendations for Other Services       Functional Status Assessment Patient has had a recent decline in their functional status and demonstrates the ability to make significant improvements in function in a reasonable and predictable amount of time.     Precautions / Restrictions  Precautions Precautions: Fall Restrictions Weight Bearing Restrictions: No      Mobility  Bed Mobility Overal bed mobility: Needs Assistance Bed Mobility: Supine to Sit     Supine to sit: Mod assist, Used rails, HOB elevated     General bed mobility comments: Increased time with assist to bring trunk to upright and complete rotation to EOB sitting with use of bed pad    Transfers Overall transfer level: Needs assistance Equipment used: Rolling walker (2 wheels) Transfers: Sit to/from Stand Sit to Stand: Min assist           General transfer comment: Assist to bring wt up and fwd and to balance in standing with RW    Ambulation/Gait Ambulation/Gait assistance: Min assist Gait Distance (Feet): 50 Feet Assistive device: Rolling walker (2 wheels) Gait Pattern/deviations: Step-to pattern, Step-through pattern, Decreased step length - right, Decreased step length - left, Shuffle, Trunk flexed Gait velocity: decr     General Gait Details: cues for posture and position from RW;  Noted intermittent dragging on R foot (pt states residual from prior CVA).  Assist for stability and RW management  Stairs            Wheelchair Mobility     Tilt Bed    Modified Rankin (Stroke Patients Only)       Balance Overall balance assessment: Needs assistance Sitting-balance support: Bilateral upper extremity supported, Single extremity supported, No upper extremity supported, Feet supported Sitting balance-Leahy Scale: Good Sitting balance - Comments: Static sitting-good. Dynamic sitting-fair+   Standing balance support: Bilateral upper extremity supported Standing balance-Leahy Scale: Poor  Standing balance comment: Min assist with RW                             Pertinent Vitals/Pain Pain Assessment Pain Assessment: No/denies pain    Home Living Family/patient expects to be discharged to:: Private residence Living Arrangements: Spouse/significant  other Available Help at Discharge: Family Type of Home: House Home Access: Ramped entrance       Home Layout: One level Home Equipment: Agricultural consultant (2 wheels);BSC/3in1;Rollator (4 wheels);Shower seat Additional Comments: Pt occasionally gave conflicting reports, therefore information contained here should be verified.    Prior Function Prior Level of Function : Needs assist             Mobility Comments: She reported using a rollator for household ambulation. ADLs Comments: Pt reported being modified independent to independent with ADLs, except for needing assistance for donning and doffing socks and shoes. She does not drive.     Extremity/Trunk Assessment   Upper Extremity Assessment Upper Extremity Assessment: Defer to OT evaluation;Right hand dominant RUE Deficits / Details: AROM WFL. Grip strength grossly 3+/5 to 4-/5. Pt reported a history of CVA, causing RUE weakness and fine motor coordination deficits; she reported difficulty with hand writing LUE Deficits / Details: AROM WFL. Grip strength grossly 4-/5    Lower Extremity Assessment Lower Extremity Assessment: RLE deficits/detail;Generalized weakness RLE Deficits / Details: Pt reported a history of CVA causing RLE weaker vs L       Communication   Communication Communication: No apparent difficulties  Cognition Arousal: Alert Behavior During Therapy: WFL for tasks assessed/performed Overall Cognitive Status: No family/caregiver present to determine baseline cognitive functioning                                 General Comments: Oriented to person, place, month, and year. Disoriented to situation. Able to follow simple commands consistently.        General Comments      Exercises     Assessment/Plan    PT Assessment Patient needs continued PT services  PT Problem List Decreased strength;Decreased activity tolerance;Decreased balance;Decreased mobility;Decreased knowledge of use of  DME       PT Treatment Interventions DME instruction;Gait training;Functional mobility training;Therapeutic activities;Therapeutic exercise;Balance training;Patient/family education    PT Goals (Current goals can be found in the Care Plan section)  Acute Rehab PT Goals Patient Stated Goal: Regain IND PT Goal Formulation: With patient Time For Goal Achievement: 10/29/23 Potential to Achieve Goals: Good    Frequency Min 1X/week     Co-evaluation PT/OT/SLP Co-Evaluation/Treatment: Yes Reason for Co-Treatment: For patient/therapist safety;To address functional/ADL transfers PT goals addressed during session: Mobility/safety with mobility OT goals addressed during session: ADL's and self-care       AM-PAC PT "6 Clicks" Mobility  Outcome Measure Help needed turning from your back to your side while in a flat bed without using bedrails?: A Lot Help needed moving from lying on your back to sitting on the side of a flat bed without using bedrails?: A Lot Help needed moving to and from a bed to a chair (including a wheelchair)?: A Little Help needed standing up from a chair using your arms (e.g., wheelchair or bedside chair)?: A Little Help needed to walk in hospital room?: A Little Help needed climbing 3-5 steps with a railing? : A Lot 6 Click Score: 15  End of Session Equipment Utilized During Treatment: Gait belt Activity Tolerance: Patient tolerated treatment well;Patient limited by fatigue Patient left: in chair;with call bell/phone within reach;with chair alarm set Nurse Communication: Mobility status PT Visit Diagnosis: History of falling (Z91.81);Difficulty in walking, not elsewhere classified (R26.2)    Time: 6962-9528 PT Time Calculation (min) (ACUTE ONLY): 25 min   Charges:   PT Evaluation $PT Eval Low Complexity: 1 Low   PT General Charges $$ ACUTE PT VISIT: 1 Visit         Mauro Kaufmann PT Acute Rehabilitation Services Pager [phone] Office  [phone]   Froylan Hobby 10/16/2023, 2:23 PM

## 2023-10-16 NOTE — Evaluation (Signed)
Occupational Therapy Evaluation Patient Details Name: [name] MRN: [id number] DOB: [birth year] Today's Date: 10/16/2023   History of Present Illness [name] is a 77 yr old female admitted to the hospital 10-14-23 with suprapubic pain, nausea, vomiting, and fatigue. She was found to have UTI. PMH: rectal CA s/p colectomy, recurrent cervical adenocarcinoma, CVA, CAD, DM II, chronic pain, chronic anemia, arthritis, L THA   Clinical Impression   The pt is currently limited by the below listed deficits, which compromise her ADL performance and overall functional independence (see OT problem list). During the session today, she required min to mod assist for supine to sit, simulated lower body dressing, sit to stand using a RW, and for ambulating using a RW. She reported a history of CVA causing RUE and RLE weakness, as well as RUE fine motor coordination deficits.  She will benefit from further OT services to maximize her safety and independence with ADLs & to decrease the risk for restricted participation in meaningful activities. Pt stated she has a supportive [name] who can stay with her for a few days upon discharge, as well as assist her as needed. If her [name] is able to do so, then pt can return home with home health OT. If her [name]/family is unable to do so, then short-term SNF rehab is recommended.       If plan is discharge home, recommend the following: Help with stairs or ramp for entrance;Assistance with cooking/housework;Assist for transportation;Direct supervision/assist for medications management;A little help with walking and/or transfers;A little help with bathing/dressing/bathroom    Functional Status Assessment  Patient has had a recent decline in their functional status and demonstrates the ability to make significant improvements in function in a reasonable and predictable amount of time.  Equipment Recommendations  None recommended by OT    Recommendations  for Other Services       Precautions / Restrictions Precautions Precautions: Fall Restrictions Weight Bearing Restrictions: No      Mobility Bed Mobility Overal bed mobility: Needs Assistance Bed Mobility: Supine to Sit     Supine to sit: Mod assist, Used rails, HOB elevated          Transfers Overall transfer level: Needs assistance Equipment used: Rolling walker (2 wheels) Transfers: Sit to/from Stand Sit to Stand: Min assist                  Balance       Sitting balance - Comments: Static sitting-good. Dynamic sitting-fair+       Standing balance comment: Min assist with RW           ADL either performed or assessed with clinical judgement   ADL Overall ADL's : Needs assistance/impaired Eating/Feeding: Set up;Sitting Eating/Feeding Details (indicate cue type and reason): at chair level Grooming: Set up;Sitting Grooming Details (indicate cue type and reason): at chair level         Upper Body Dressing : Minimal assistance;Sitting   Lower Body Dressing: Moderate assistance;Sit to/from stand       Toileting- Architect and Hygiene: Moderate assistance;Sit to/from stand Toileting - Clothing Manipulation Details (indicate cue type and reason): at bathroom level, based on clinical judgement              Pertinent Vitals/Pain Pain Assessment Pain Assessment: No/denies pain     Extremity/Trunk Assessment Upper Extremity Assessment Upper Extremity Assessment: Right hand dominant;RUE deficits/detail;LUE deficits/detail;Generalized weakness RUE Deficits / Details: AROM WFL. Grip strength grossly 3+/5 to 4-/5. Pt  reported a history of CVA, causing RUE weakness and fine motor coordination deficits; she reported difficulty with hand writing LUE Deficits / Details: AROM WFL. Grip strength grossly 4-/5   Lower Extremity Assessment Lower Extremity Assessment: RLE deficits/detail;Generalized weakness RLE Deficits / Details: Pt  reported a history of CVA causing RLE weakness.       Communication Communication Communication: No apparent difficulties   Cognition Arousal: Alert Behavior During Therapy: WFL for tasks assessed/performed Overall Cognitive Status: No family/caregiver present to determine baseline cognitive functioning      General Comments: Oriented to person, place, month, and year. Disoriented to situation. Able to follow simple commands consistently.                Home Living Family/patient expects to be discharged to:: Private residence Living Arrangements: Spouse/significant other Available Help at Discharge: Family Type of Home: House Home Access: Ramped entrance     Home Layout: One level     Bathroom Shower/Tub: Walk-in shower         Home Equipment: Agricultural consultant (2 wheels);BSC/3in1;Rollator (4 wheels);Shower seat   Additional Comments: Pt occasionally gave conflicting reports, therefore information contained here should be verified.      Prior Functioning/Environment Prior Level of Function : Needs assist             Mobility Comments: She reported using a rollator for household ambulation. ADLs Comments: Pt reported being modified independent to independent with ADLs, except for needing assistance for donning and doffing socks and shoes. She does not drive.        OT Problem List: Decreased strength;Impaired balance (sitting and/or standing);Decreased coordination;Decreased cognition;Decreased knowledge of use of DME or AE      OT Treatment/Interventions: Self-care/ADL training;Therapeutic exercise;Energy conservation;DME and/or AE instruction;Balance training;Patient/family education;Therapeutic activities    OT Goals(Current goals can be found in the care plan section) Acute Rehab OT Goals OT Goal Formulation: With patient Time For Goal Achievement: 10/30/23 Potential to Achieve Goals: Good ADL Goals Pt Will Perform Upper Body Dressing: with set-up;with  supervision;sitting Pt Will Perform Lower Body Dressing: with min assist;sitting/lateral leans;sit to/from stand Pt Will Transfer to Toilet: with supervision;ambulating;grab bars Pt Will Perform Toileting - Clothing Manipulation and hygiene: with supervision;sit to/from stand  OT Frequency: Min 1X/week    Co-evaluation PT/OT/SLP Co-Evaluation/Treatment: Yes Reason for Co-Treatment: For patient/therapist safety;To address functional/ADL transfers PT goals addressed during session: Mobility/safety with mobility OT goals addressed during session: ADL's and self-care      AM-PAC OT "6 Clicks" Daily Activity     Outcome Measure Help from another person eating meals?: A Little Help from another person taking care of personal grooming?: A Little Help from another person toileting, which includes using toliet, bedpan, or urinal?: A Lot Help from another person bathing (including washing, rinsing, drying)?: A Lot Help from another person to put on and taking off regular upper body clothing?: A Little Help from another person to put on and taking off regular lower body clothing?: A Lot 6 Click Score: 15   End of Session Equipment Utilized During Treatment: Gait belt;Rolling walker (2 wheels) Nurse Communication: Mobility status  Activity Tolerance: Patient tolerated treatment well Patient left: in chair;with chair alarm set;with call bell/phone within reach  OT Visit Diagnosis: Unsteadiness on feet (R26.81);Muscle weakness (generalized) (M62.81)                Time: 6213-0865 OT Time Calculation (min): 25 min Charges:  OT General Charges $OT Visit: 1 Visit OT Evaluation $  OT Eval Moderate Complexity: 1 Mod    Ida Milbrath L Kenzee Bassin, OTR/L 10/16/2023, 1:23 PM

## 2023-10-16 NOTE — Progress Notes (Signed)
PROGRESS NOTE    [name]  BMW:[id number] DOB: Sep 22, 1946 DOA: 10/14/2023 PCP: [name] Moccasin, MD  Chief Complaint  Patient presents with   Weakness    Hospital Course:  [name] is 77 y.o. female with rectal adenocarcinoma status post colectomy, recurrent cervical adenocarcinoma, history of CVA, CAD, type 2 diabetes, hypothyroidism, chronic pain, chronic anemia, and recent admission for abdominal pain and GI bleed discharged on 11/27.  She reports she was feeling well at time of discharge but when she returned home began experiencing nausea, vomiting, suprapubic pain.  She Leander Rams presented to the ED.  On arrival she was found to be febrile with a leukocytosis of 13.  Blood and urine cultures were collected and patient was admitted for further workup.  Subjective: No acute events overnight. Pt resting easily on evaluation. No acute complaints.  Objective: Vitals:   10/15/23 1821 10/15/23 2010 10/16/23 0223 10/16/23 0516  BP: (!) 163/56 (!) 163/63 (!) 157/64 (!) 157/55  Pulse: 83 79 88 77  Resp: 18 17 18 18   Temp: (!) 97.5 F (36.4 C) (!) 97.5 F (36.4 C) 98.7 F (37.1 C) 98.2 F (36.8 C)  TempSrc: Oral  Oral Oral  SpO2: 96% 98% 96% 99%  Weight:      Height:        Intake/Output Summary (Last 24 hours) at 10/16/2023 0728 Last data filed at 10/16/2023 0600 Gross per 24 hour  Intake 612.43 ml  Output 850 ml  Net -237.57 ml   Filed Weights   10/14/23 2018 10/15/23 0115  Weight: 61.7 kg 68.3 kg    Examination: General exam: Appears calm and comfortable, drowsy Respiratory system: No work of breathing, symmetric chest wall expansion Cardiovascular system: S1 & S2 heard, RRR.  Gastrointestinal system: Ostomy noted.  Abdomen is nondistended, tender to palpation in suprapubic region. Neuro: Drowsy but arousable, oriented. No focal neurological deficits. Extremities: Symmetric, expected ROM, no pedal edema. Skin: No rashes, lesions Psychiatry:  Demonstrates appropriate judgement and insight. Mood & affect appropriate for situation.   GU: amber Colored urine in the canister.  Assessment & Plan:  Principal Problem:   Acute UTI Active Problems:   History of CVA (cerebrovascular accident)   Rectal cancer (HCC)   Hypothyroidism   DMII (diabetes mellitus, type 2) (HCC)   Essential hypertension   Coronary artery disease   Chronic diastolic CHF (congestive heart failure) (HCC)   Upper GI bleed   PUD (peptic ulcer disease)  UTI - Presents with fever leukocytosis and suprapubic pain.  UA compatible with infection. Cultures with GNR.  - Previous urine cultures positive for Pseudomonas and Citrobacter resistant to ceftriaxone. Cont cefepime for now.  de-escalate as able  - Follow blood  cultures - Currently voiding freely, no concerns for retention at this time.  Bladder scan if output slows  Recent GI bleed Peptic ulcer disease Erosive gastritis Schatzki ring Duodenal stenosis -Status post EGD on 11/26 - No active bleeding at this time - Trend hemoglobin - Continue PPI and Carafate - Plavix has been resumed, will follow closely.  CAD - Appears stable with no anginal complaints - Continue statin and Plavix for now  Type 2 diabetes, with hyperglycemia - Hemoglobin A1c 5.7% - Continue sliding scale insulin titrate up as tolerated  History of CVA  - Stable.  Currently deconditioned worse than baseline.  Continue statin and Plavix as above -- PT/OT  Hypothyroidism - Continue home dose Synthroid  Chronic pain - Continue home regimen for now  History of rectal and cervical cancer - Not currently undergoing active treatment - Follow-up with outpatient heme onco  BMI 29 - Outpatient follow up for lifestyle modification and risk factor management  Depression - Continue home dose Zoloft  Hyperlipidemia - Continue statin  Chronic anemia - Hemoglobin baseline appears to be 9-10 - Currently stable - Monitor  closely given resumption of Plavix.  Hyponatremia 130 on arrival  - Continue to follow CMP - Pt is on SSRI and daughter believes this is a fairly new medication. Chart review shows she has been on and off since at least 2022. Will consider decreasing medication if Na not improving.  Heart failure with preserved EF Echocardiogram November 2024: LVEF 60 to 65%, grade 1 diastolic dysfunction Elevated blood pressure without history of hypertension - BP may be elevated in setting of acute pain. - Follow closely - Initiate amlodipine if continuing to uptrend. Currently DBP ~50s, unlikely to tolerate antihypertensive  Dementia -- Some issues with acute delirium this admission -- Cont close monitoring and frequent reorientation  Generalized deconditioning - PT/OT eval's pending. - Patient's daughter reports patient will not consider rehab at discharge.  Will likely go home with home health PT/OT pending AMPAC score   DVT prophylaxis: Lovenox Code Status: Full Family Communication: Called and discussed care plan with daughter, [name] Disposition:  Status is: Inpatient, ongoing IV abx.  Home health has been ordered for discharge  Consultants:  none  Procedures:  none  Antimicrobials:  Anti-infectives (From admission, onward)    Start     Dose/Rate Route Frequency Ordered Stop   10/15/23 0600  ceFEPIme (MAXIPIME) 2 g in sodium chloride 0.9 % 100 mL IVPB        2 g 200 mL/hr over 30 Minutes Intravenous Every 12 hours 10/14/23 2352     10/14/23 2130  cefTRIAXone (ROCEPHIN) 1 g in sodium chloride 0.9 % 100 mL IVPB        1 g 200 mL/hr over 30 Minutes Intravenous  Once 10/14/23 2128 10/14/23 2233       Data Reviewed: I have personally reviewed following labs and imaging studies CBC: Recent Labs  Lab 10/13/23 0103 10/13/23 0951 10/13/23 1746 10/14/23 0426 10/14/23 2025 10/15/23 0509 10/16/23 0307  WBC 5.7  --   --  6.5 13.0* 13.4* 10.3  NEUTROABS  --   --   --   --  12.0*  --    --   HGB 9.0*   < > 10.4* 9.1* 10.9* 10.6* 9.2*  HCT 31.1*   < > 34.3* 30.0* 35.6* 34.8* 29.8*  MCV 94.2  --   --  90.6 90.6 91.3 89.0  PLT 196  --   --  160 223 217 181   < > = values in this interval not displayed.   Basic Metabolic Panel: Recent Labs  Lab 10/13/23 0103 10/14/23 0426 10/14/23 2025 10/15/23 0509 10/16/23 0307  NA 141 138 130* 129* 130*  K 3.5 3.3* 4.5 4.3 3.8  CL 107 106 100 98 101  CO2 22 22 18* 20* 20*  GLUCOSE 81 201* 246* 147* 153*  BUN 23 35* 33* 33* 30*  CREATININE 0.99 1.09* 1.01* 0.95 0.95  CALCIUM 8.2* 7.9* 8.3* 8.1* 7.9*   GFR: Estimated Creatinine Clearance: 42.7 mL/min (by C-G formula based on SCr of 0.95 mg/dL). Liver Function Tests: Recent Labs  Lab 10/12/23 1206 10/14/23 0426 10/14/23 2025  AST 19 13* 14*  ALT 12 9 8   ALKPHOS 121 71 83  BILITOT 1.0 0.7 1.3*  PROT 6.4* 5.6* 6.1*  ALBUMIN 3.3* 2.5* 2.9*   CBG: Recent Labs  Lab 10/15/23 0744 10/15/23 1129 10/15/23 1624 10/15/23 2205 10/16/23 0724  GLUCAP 144* 193* 165* 141* 145*    Recent Results (from the past 240 hour(s))  Culture, blood (Routine x 2)     Status: None (Preliminary result)   Collection Time: 10/14/23  8:25 PM   Specimen: BLOOD  Result Value Ref Range Status   Specimen Description   Final    BLOOD RIGHT ANTECUBITAL Performed at [facility], [address] [place], [place] [address]    Special Requests   Final    BOTTLES DRAWN AEROBIC AND ANAEROBIC Blood Culture results may not be optimal due to an inadequate volume of blood received in culture bottles Performed at [facility], [address] [place], [place] [address]    Culture   Final    NO GROWTH < 12 HOURS Performed at [facility], [address], [place], [place] [address]    Report Status PENDING  Incomplete  Resp panel by RT-PCR (RSV, Flu A&B, Covid) Anterior Nasal Swab     Status: None   Collection Time: 10/14/23  8:52 PM   Specimen: Anterior  Nasal Swab  Result Value Ref Range Status   SARS Coronavirus 2 by RT PCR NEGATIVE NEGATIVE Final    Comment: (NOTE) SARS-CoV-2 target nucleic acids are NOT DETECTED.  The SARS-CoV-2 RNA is generally detectable in upper respiratory specimens during the acute phase of infection. The lowest concentration of SARS-CoV-2 viral copies this assay can detect is 138 copies/mL. A negative result does not preclude SARS-Cov-2 infection and should not be used as the sole basis for treatment or other patient management decisions. A negative result may occur with  improper specimen collection/handling, submission of specimen other than nasopharyngeal swab, presence of viral mutation(s) within the areas targeted by this assay, and inadequate number of viral copies(<138 copies/mL). A negative result must be combined with clinical observations, patient history, and epidemiological information. The expected result is Negative.  Fact Sheet for Patients:  BloggerCourse.com  Fact Sheet for Healthcare Providers:  SeriousBroker.it  This test is no t yet approved or cleared by the [place] FDA and  has been authorized for detection and/or diagnosis of SARS-CoV-2 by FDA under an Emergency Use Authorization (EUA). This EUA will remain  in effect (meaning this test can be used) for the duration of the COVID-19 declaration under Section 564(b)(1) of the Act, 21 U.S.C.section 360bbb-3(b)(1), unless the authorization is terminated  or revoked sooner.       Influenza A by PCR NEGATIVE NEGATIVE Final   Influenza B by PCR NEGATIVE NEGATIVE Final    Comment: (NOTE) The Xpert Xpress SARS-CoV-2/FLU/RSV plus assay is intended as an aid in the diagnosis of influenza from Nasopharyngeal swab specimens and should not be used as a sole basis for treatment. Nasal washings and aspirates are unacceptable for Xpert Xpress SARS-CoV-2/FLU/RSV testing.  Fact Sheet for  Patients: BloggerCourse.com  Fact Sheet for Healthcare Providers: SeriousBroker.it  This test is not yet approved or cleared by the [place] FDA and has been authorized for detection and/or diagnosis of SARS-CoV-2 by FDA under an Emergency Use Authorization (EUA). This EUA will remain in effect (meaning this test can be used) for the duration of the COVID-19 declaration under Section 564(b)(1) of the Act, 21 U.S.C. section 360bbb-3(b)(1), unless the authorization is terminated or revoked.  Resp Syncytial Virus by PCR NEGATIVE NEGATIVE Final    Comment: (NOTE) Fact Sheet for Patients: BloggerCourse.com  Fact Sheet for Healthcare Providers: SeriousBroker.it  This test is not yet approved or cleared by the [place] FDA and has been authorized for detection and/or diagnosis of SARS-CoV-2 by FDA under an Emergency Use Authorization (EUA). This EUA will remain in effect (meaning this test can be used) for the duration of the COVID-19 declaration under Section 564(b)(1) of the Act, 21 U.S.C. section 360bbb-3(b)(1), unless the authorization is terminated or revoked.  Performed at [facility], [address], [place], [place] [address]      Radiology Studies: DG Chest Portable 1 View  Result Date: 10/14/2023 CLINICAL DATA:  Fever, cervical and rectal cancer EXAM: PORTABLE CHEST 1 VIEW COMPARISON:  Radiograph 09/10/2023 FINDINGS: Right chest wall Port-A-Cath tip in the right atrium. Stable cardiomediastinal silhouette. Aortic atherosclerotic calcification. No focal consolidation, pleural effusion, or pneumothorax. No displaced rib fractures. IMPRESSION: No active disease. Electronically Signed   By: Minerva Fester M.D.   On: 10/14/2023 21:15   US Abdomen Limited RUQ (LIVER/GB)  Result Date: 10/14/2023 CLINICAL DATA:  [id number] Abdominal pain [id number] EXAM:  ULTRASOUND ABDOMEN LIMITED RIGHT UPPER QUADRANT COMPARISON:  HIDA scan 10/14/2023, CT 10/12/2023 FINDINGS: Gallbladder: Gallbladder is distended measuring 13 cm in length. No significant wall thickening. No sludge or stones within the gallbladder. No sonographic Murphy sign noted by sonographer. Common bile duct: Not visualized. Liver: No focal lesion identified. Within normal limits in parenchymal echogenicity. Portal vein is patent on color Doppler imaging with normal direction of blood flow towards the liver. Other: None. Technical note: Sonographer describes a technically difficult study 2 2 poor penetration related to patient body habitus. Portions of the left hepatic lobe as well as the common bile duct were not able to be assessed. IMPRESSION: 1. Distended gallbladder without sonographic evidence of acute cholecystitis. 2. Nonvisualization of the common bile duct. Electronically Signed   By: Duanne Guess D.O.   On: 10/14/2023 16:02   NM Hepatobiliary Liver Func  Result Date: 10/14/2023 CLINICAL DATA:  Concern for acalculous cholecystitis. Biliary colic. EXAM: NUCLEAR MEDICINE HEPATOBILIARY IMAGING TECHNIQUE: Sequential images of the abdomen were obtained out to 60 minutes following intravenous administration of radiopharmaceutical. RADIOPHARMACEUTICALS:  5.5 mCi Tc-10m  Choletec IV COMPARISON:  CT 10/12/2019 FINDINGS: Prompt clearance radiotracer from blood pool and homogeneous uptake in liver. Counts are evident in the small bowel 25 minutes. Small focus of activity is noted in the region the neck of the gallbladder at 50 minutes. This activity continues to expand over 2 Hours of imaging. Favor delayed filling of the gallbladder. IMPRESSION: 1. Patent common bile duct. 2. Delayed filling of the gallbladder. Ejection fraction could not be performed as only partial filling of the gallbladder at 2 hours imaging. Electronically Signed   By: Genevive Bi M.D.   On: 10/14/2023 11:19    Scheduled  Meds:  atorvastatin  40 mg Oral QHS   baclofen  20 mg Oral BID   Chlorhexidine Gluconate Cloth  6 each Topical Daily   clopidogrel  75 mg Oral Daily   enoxaparin (LOVENOX) injection  40 mg Subcutaneous Q24H   gabapentin  300 mg Oral Daily   gabapentin  600 mg Oral QHS   insulin aspart  0-5 Units Subcutaneous QHS   insulin aspart  0-6 Units Subcutaneous TID WC   levothyroxine  137 mcg Oral Q0600   oxyCODONE  10 mg Oral Q12H  pantoprazole  40 mg Oral BID AC   Followed by   Melene Muller ON 12/23/2023] pantoprazole  40 mg Oral Daily   sertraline  50 mg Oral QHS   sodium chloride flush  10-40 mL Intracatheter Q12H   sucralfate  1 g Oral TID WC & HS   Continuous Infusions:  ceFEPime (MAXIPIME) IV 2 g (10/16/23 0553)     LOS: 2 days    Time spent:   Debarah Crape, DO [facility]  To contact the attending physician between 7A-7P please use Epic Chat. To contact the covering physician during after hours 7P-7A, please review Amion.   10/16/2023, 7:28 AM

## 2023-10-17 LAB — URINE CULTURE: Culture: >=100000 — AB

## 2023-10-17 LAB — BASIC METABOLIC PANEL
Anion gap: 9 (ref 5–15)
BUN: 24 mg/dL — ABNORMAL HIGH (ref 8–23)
CO2: 23 mmol/L (ref 22–32)
Calcium: 7.8 mg/dL — ABNORMAL LOW (ref 8.9–10.3)
Chloride: 102 mmol/L (ref 98–111)
Creatinine, Ser: 0.85 mg/dL (ref 0.44–1.00)
GFR, Estimated: >60 mL/min (ref >60)
Glucose, Bld: 210 mg/dL — ABNORMAL HIGH (ref 70–99)
Potassium: 3.5 mmol/L (ref 3.5–5.1)
Sodium: 134 mmol/L — ABNORMAL LOW (ref 135–145)

## 2023-10-17 LAB — CBC
HCT: 25.7 % — ABNORMAL LOW (ref 36.0–46.0)
Hemoglobin: 8 g/dL — ABNORMAL LOW (ref 12.0–15.0)
MCH: 27.8 pg (ref 26.0–34.0)
MCHC: 31.1 g/dL (ref 30.0–36.0)
MCV: 89.2 fL (ref 80.0–100.0)
Platelets: 171 10*3/uL (ref 150–400)
RBC: 2.88 MIL/uL — ABNORMAL LOW (ref 3.87–5.11)
RDW: 15.1 % (ref 11.5–15.5)
WBC: 8.4 10*3/uL (ref 4.0–10.5)
nRBC: 0 % (ref 0.0–0.2)

## 2023-10-17 LAB — GLUCOSE, CAPILLARY
Glucose-Capillary: 169 mg/dL — ABNORMAL HIGH (ref 70–99)
Glucose-Capillary: 176 mg/dL — ABNORMAL HIGH (ref 70–99)
Glucose-Capillary: 168 mg/dL — ABNORMAL HIGH (ref 70–99)
Glucose-Capillary: 175 mg/dL — ABNORMAL HIGH (ref 70–99)

## 2023-10-17 MED ORDER — SODIUM CHLORIDE 0.9 % IV SOLN
1.0000 g | Freq: Two times a day (BID) | INTRAVENOUS | Status: DC
  Administered 2023-10-17 – 2023-10-20 (×7): 1 g via INTRAVENOUS
  Filled 2023-10-17 (×7): qty 20

## 2023-10-17 MED ORDER — FUROSEMIDE 10 MG/ML IJ SOLN
20.0000 mg | Freq: Once | INTRAMUSCULAR | Status: AC
  Administered 2023-10-17: 20 mg via INTRAVENOUS
  Filled 2023-10-17: qty 2

## 2023-10-17 NOTE — Plan of Care (Signed)
  Problem: Coping: Goal: Ability to adjust to condition or change in health will improve Outcome: Progressing   Problem: Metabolic: Goal: Ability to maintain appropriate glucose levels will improve Outcome: Progressing   

## 2023-10-17 NOTE — Progress Notes (Signed)
PROGRESS NOTE    [name]  XBM:[id number] DOB: 11/13/1946 DOA: 10/14/2023 PCP: Lewis Moccasin, MD  Chief Complaint  Patient presents with   Weakness    Hospital Course:  [name] is 77 y.o. female with rectal adenocarcinoma status post colectomy, recurrent cervical adenocarcinoma, history of CVA, CAD, type 2 diabetes, hypothyroidism, chronic pain, chronic anemia, and recent admission for abdominal pain and GI bleed discharged on 11/27.  She reports she was feeling well at time of discharge but when she returned home began experiencing nausea, vomiting, suprapubic pain.  She Leander Rams presented to the ED.  On arrival she was found to be febrile with a leukocytosis of 13.  Blood and urine cultures were collected and patient was admitted for further workup.  Subjective: Sluggish today.  She is responsive but disoriented.  No family at bedside currently.  Discussed with patient's daughter Selena Batten on the phone.  Objective: Vitals:   10/16/23 2116 10/17/23 0500 10/17/23 0508 10/17/23 1154  BP: (!) 160/55  (!) 131/54 (!) 144/52  Pulse: 79  71 77  Resp: 14  12 16   Temp: 99.3 F (37.4 C)  99.2 F (37.3 C) 98.8 F (37.1 C)  TempSrc: Oral  Oral Oral  SpO2: 100%  97% 94%  Weight:  65.9 kg    Height:        Intake/Output Summary (Last 24 hours) at 10/17/2023 1514 Last data filed at 10/17/2023 1154 Gross per 24 hour  Intake 747.23 ml  Output 100 ml  Net 647.23 ml   Filed Weights   10/14/23 2018 10/15/23 0115 10/17/23 0500  Weight: 61.7 kg 68.3 kg 65.9 kg    Examination: General exam: Appears calm and comfortable, drowsy Respiratory system: No work of breathing, symmetric chest wall expansion Cardiovascular system: S1 & S2 heard, RRR.  Gastrointestinal system: Ostomy noted.  Abdomen is nondistended, tender to palpation in suprapubic region. Neuro: Drowsy but arousable, disoriented. No focal neurological deficits. Extremities: Symmetric, expected ROM, no pedal  edema. Skin: No rashes, lesions Psychiatry: Disoriented to situation.  Assessment & Plan:  Principal Problem:   Acute UTI Active Problems:   History of CVA (cerebrovascular accident)   Rectal cancer (HCC)   Hypothyroidism   DMII (diabetes mellitus, type 2) (HCC)   Essential hypertension   Coronary artery disease   Chronic diastolic CHF (congestive heart failure) (HCC)   Upper GI bleed   PUD (peptic ulcer disease)  UTI - Presents with fever leukocytosis and suprapubic pain.  UA compatible with infection.  -Cultures now positive for ESBL Klebsiella and Proteus, both sensitive to Merrem.  Has been on cefepime.  Will proceed with Merrem for now.  -Blood cultures remain negative - Currently voiding freely, have ordered for bladder scan, high risk of retention.  Acute metabolic encephalopathy - Patient is disoriented beyond her baseline.  Degree of underlying dementia - Suspect that this altered mental status is due to UTI.  Patient has been on cefepime but unfortunately urine culture results today show resistance.  She has now been switched to Merrem.  Anticipate improvement as infection is treated.  Recent GI bleed Peptic ulcer disease Erosive gastritis Schatzki ring Duodenal stenosis -Status post EGD on 11/26 - No active bleeding at this time - Plavix was resumed on admission.  Hemoglobin is gradually downtrending.  Still stable above 7.  We will discontinue any further Plavix.  Discussed this with the patient's daughter is amendable - Continue PPI and Carafate  CAD - Appears stable with no  anginal complaints - Continue statin only for now.  Hold aspirin and Plavix due to GI bleed as above.  Type 2 diabetes, with hyperglycemia - Hemoglobin A1c 5.7% - Continue sliding scale insulin titrate up as tolerated  History of CVA - Stable.  Currently deconditioned worse than baseline.  Continue statin -- PT/OT  Hypothyroidism - Continue home dose Synthroid  Chronic pain -  Continue home regimen for now  History of rectal and cervical cancer - Not currently undergoing active treatment - Follow-up with outpatient heme onco  BMI 29 - Outpatient follow up for lifestyle modification and risk factor management  Depression - Continue home dose Zoloft  Hyperlipidemia - Continue statin  Chronic anemia - Hemoglobin baseline appears to be 9-10 - Appears to be downtrending.  Hold Plavix as above  Hyponatremia 130 on arrival, resolved now. - Continue to follow CMP  Heart failure with preserved EF Echocardiogram November 2024: LVEF 60 to 65%, grade 1 diastolic dysfunction Elevated blood pressure without history of hypertension - BP may be elevated in setting of acute pain. - Follow closely - Initiate amlodipine if continuing to uptrend. Currently DBP ~50s, unlikely to tolerate antihypertensive - Does show some upper extremity edema today.  Will trial one-time dose of Lasix.  Dementia -- Some issues with acute delirium this admission -- Cont close monitoring and frequent reorientation  Generalized deconditioning - PT/OT eval's pending. - Patient's daughter reports patient will not consider rehab at discharge.  Will likely go home with home health PT/OT    DVT prophylaxis: Lovenox Code Status: Full Family Communication: Called and discussed care plan with daughter, Selena Batten Disposition: Ongoing IV antibiotics, persistent encephalopathy.  Maintain inpatient  Consultants:  none  Procedures:  none  Antimicrobials:  Anti-infectives (From admission, onward)    Start     Dose/Rate Route Frequency Ordered Stop   10/17/23 1100  meropenem (MERREM) 1 g in sodium chloride 0.9 % 100 mL IVPB        1 g 200 mL/hr over 30 Minutes Intravenous Every 12 hours 10/17/23 1012     10/15/23 0600  ceFEPIme (MAXIPIME) 2 g in sodium chloride 0.9 % 100 mL IVPB  Status:  Discontinued        2 g 200 mL/hr over 30 Minutes Intravenous Every 12 hours 10/14/23 2352 10/17/23 1012    10/14/23 2130  cefTRIAXone (ROCEPHIN) 1 g in sodium chloride 0.9 % 100 mL IVPB        1 g 200 mL/hr over 30 Minutes Intravenous  Once 10/14/23 2128 10/14/23 2233       Data Reviewed: I have personally reviewed following labs and imaging studies CBC: Recent Labs  Lab 10/14/23 0426 10/14/23 2025 10/15/23 0509 10/16/23 0307 10/17/23 0302  WBC 6.5 13.0* 13.4* 10.3 8.4  NEUTROABS  --  12.0*  --   --   --   HGB 9.1* 10.9* 10.6* 9.2* 8.0*  HCT 30.0* 35.6* 34.8* 29.8* 25.7*  MCV 90.6 90.6 91.3 89.0 89.2  PLT 160 223 217 181 171   Basic Metabolic Panel: Recent Labs  Lab 10/14/23 0426 10/14/23 2025 10/15/23 0509 10/16/23 0307 10/17/23 0302  NA 138 130* 129* 130* 134*  K 3.3* 4.5 4.3 3.8 3.5  CL 106 100 98 101 102  CO2 22 18* 20* 20* 23  GLUCOSE 201* 246* 147* 153* 210*  BUN 35* 33* 33* 30* 24*  CREATININE 1.09* 1.01* 0.95 0.95 0.85  CALCIUM 7.9* 8.3* 8.1* 7.9* 7.8*   GFR: Estimated Creatinine Clearance: 47  mL/min (by C-G formula based on SCr of 0.85 mg/dL). Liver Function Tests: Recent Labs  Lab 10/12/23 1206 10/14/23 0426 10/14/23 2025  AST 19 13* 14*  ALT 12 9 8   ALKPHOS 121 71 83  BILITOT 1.0 0.7 1.3*  PROT 6.4* 5.6* 6.1*  ALBUMIN 3.3* 2.5* 2.9*   CBG: Recent Labs  Lab 10/16/23 0724 10/16/23 1129 10/16/23 1633 10/16/23 2108 10/17/23 0722  GLUCAP 145* 144* 380* 216* 169*    Recent Results (from the past 240 hour(s))  Culture, blood (Routine x 2)     Status: None (Preliminary result)   Collection Time: 10/14/23  8:25 PM   Specimen: BLOOD  Result Value Ref Range Status   Specimen Description   Final    BLOOD RIGHT ANTECUBITAL Performed at [facility], [address] [place], [place] [address]    Special Requests   Final    BOTTLES DRAWN AEROBIC AND ANAEROBIC Blood Culture results may not be optimal due to an inadequate volume of blood received in culture bottles Performed at [facility], [address] [place], [place] [address]    Culture   Final    NO GROWTH 3 DAYS Performed at [facility], [address] [place], [place] [address]    Report Status PENDING  Incomplete  Resp panel by RT-PCR (RSV, Flu A&B, Covid) Anterior Nasal Swab     Status: None   Collection Time: 10/14/23  8:52 PM   Specimen: Anterior Nasal Swab  Result Value Ref Range Status   SARS Coronavirus 2 by RT PCR NEGATIVE NEGATIVE Final    Comment: (NOTE) SARS-CoV-2 target nucleic acids are NOT DETECTED.  The SARS-CoV-2 RNA is generally detectable in upper respiratory specimens during the acute phase of infection. The lowest concentration of SARS-CoV-2 viral copies this assay can detect is 138 copies/mL. A negative result does not preclude SARS-Cov-2 infection and should not be used as the sole basis for treatment or other patient management decisions. A negative result may occur with  improper specimen collection/handling, submission of specimen other than nasopharyngeal swab, presence of viral mutation(s) within the areas targeted by this assay, and inadequate number of viral copies(<138 copies/mL). A negative result must be combined with clinical observations, patient history, and epidemiological information. The expected result is Negative.  Fact Sheet for Patients:  BloggerCourse.com  Fact Sheet for Healthcare Providers:  SeriousBroker.it  This test is no t yet approved or cleared by the [place] FDA and  has been authorized for detection and/or diagnosis of SARS-CoV-2 by FDA under an Emergency Use Authorization (EUA). This EUA will remain  in effect (meaning this test can be used) for the duration of the COVID-19 declaration under Section 564(b)(1) of the Act, 21 U.S.C.section 360bbb-3(b)(1), unless the authorization is terminated  or revoked sooner.       Influenza A by PCR NEGATIVE NEGATIVE Final   Influenza B by PCR NEGATIVE NEGATIVE  Final    Comment: (NOTE) The Xpert Xpress SARS-CoV-2/FLU/RSV plus assay is intended as an aid in the diagnosis of influenza from Nasopharyngeal swab specimens and should not be used as a sole basis for treatment. Nasal washings and aspirates are unacceptable for Xpert Xpress SARS-CoV-2/FLU/RSV testing.  Fact Sheet for Patients: BloggerCourse.com  Fact Sheet for Healthcare Providers: SeriousBroker.it  This test is not yet approved or cleared by the [place] FDA and has been authorized for detection and/or diagnosis of SARS-CoV-2 by FDA under an Emergency Use Authorization (EUA).  This EUA will remain in effect (meaning this test can be used) for the duration of the COVID-19 declaration under Section 564(b)(1) of the Act, 21 U.S.C. section 360bbb-3(b)(1), unless the authorization is terminated or revoked.     Resp Syncytial Virus by PCR NEGATIVE NEGATIVE Final    Comment: (NOTE) Fact Sheet for Patients: BloggerCourse.com  Fact Sheet for Healthcare Providers: SeriousBroker.it  This test is not yet approved or cleared by the [place] FDA and has been authorized for detection and/or diagnosis of SARS-CoV-2 by FDA under an Emergency Use Authorization (EUA). This EUA will remain in effect (meaning this test can be used) for the duration of the COVID-19 declaration under Section 564(b)(1) of the Act, 21 U.S.C. section 360bbb-3(b)(1), unless the authorization is terminated or revoked.  Performed at [facility], [address], [place], [place] [address]   Urine Culture     Status: Abnormal   Collection Time: 10/14/23 10:04 PM   Specimen: Urine, Clean Catch  Result Value Ref Range Status   Specimen Description   Final    URINE, CLEAN CATCH Performed at [facility], [address] [place], [place] [address]    Special Requests    Final    NONE Performed at [facility], [address], [place], [place] [address]    Culture (A)  Final    >=100,000 COLONIES/mL KLEBSIELLA PNEUMONIAE 10,000 COLONIES/mL PROTEUS MIRABILIS Confirmed Extended Spectrum Beta-Lactamase Producer (ESBL).  In bloodstream infections from ESBL organisms, carbapenems are preferred over piperacillin/tazobactam. They are shown to have a lower risk of mortality. KLEBSIELLA PNEUMONIAE    Report Status 10/17/2023 FINAL  Final   Organism ID, Bacteria KLEBSIELLA PNEUMONIAE (A)  Final   Organism ID, Bacteria PROTEUS MIRABILIS (A)  Final      Susceptibility   Klebsiella pneumoniae - MIC*    AMPICILLIN >=32 RESISTANT Resistant     CEFAZOLIN >=64 RESISTANT Resistant     CEFEPIME >=32 RESISTANT Resistant     CEFTRIAXONE >=64 RESISTANT Resistant     CIPROFLOXACIN 1 RESISTANT Resistant     GENTAMICIN >=16 RESISTANT Resistant     IMIPENEM <=0.25 SENSITIVE Sensitive     NITROFURANTOIN 64 INTERMEDIATE Intermediate     TRIMETH/SULFA >=320 RESISTANT Resistant     AMPICILLIN/SULBACTAM 16 INTERMEDIATE Intermediate     PIP/TAZO <=4 SENSITIVE Sensitive ug/mL    * >=100,000 COLONIES/mL KLEBSIELLA PNEUMONIAE   Proteus mirabilis - MIC*    AMPICILLIN <=2 SENSITIVE Sensitive     CEFAZOLIN 8 SENSITIVE Sensitive     CEFEPIME <=0.12 SENSITIVE Sensitive     CEFTRIAXONE <=0.25 SENSITIVE Sensitive     CIPROFLOXACIN <=0.25 SENSITIVE Sensitive     GENTAMICIN <=1 SENSITIVE Sensitive     IMIPENEM 2 SENSITIVE Sensitive     NITROFURANTOIN 128 RESISTANT Resistant     TRIMETH/SULFA <=20 SENSITIVE Sensitive     AMPICILLIN/SULBACTAM <=2 SENSITIVE Sensitive     PIP/TAZO <=4 SENSITIVE Sensitive ug/mL    * 10,000 COLONIES/mL PROTEUS MIRABILIS  Culture, blood (Routine x 2)     Status: None (Preliminary result)   Collection Time: 10/15/23  5:19 AM   Specimen: BLOOD RIGHT HAND  Result Value Ref Range Status   Specimen Description   Final    BLOOD RIGHT  HAND Performed at [facility], [address] [place], [place] [address]    Special Requests   Final    BOTTLES DRAWN AEROBIC ONLY Blood Culture results may not be optimal due to  an inadequate volume of blood received in culture bottles Performed at [facility], [address], [place], [place] [address]    Culture   Final    NO GROWTH 2 DAYS Performed at [facility], [address], [place], [place] [address]    Report Status PENDING  Incomplete     Radiology Studies: No results found.  Scheduled Meds:  atorvastatin  40 mg Oral QHS   baclofen  20 mg Oral BID   Chlorhexidine Gluconate Cloth  6 each Topical Daily   gabapentin  300 mg Oral Daily   gabapentin  600 mg Oral QHS   insulin aspart  0-5 Units Subcutaneous QHS   insulin aspart  0-6 Units Subcutaneous TID WC   levothyroxine  137 mcg Oral Q0600   oxyCODONE  10 mg Oral Q12H   pantoprazole  40 mg Oral BID AC   Followed by   Melene Muller ON 12/23/2023] pantoprazole  40 mg Oral Daily   sertraline  50 mg Oral QHS   sodium chloride flush  10-40 mL Intracatheter Q12H   sucralfate  1 g Oral TID WC & HS   Continuous Infusions:  meropenem (MERREM) IV 1 g (10/17/23 1140)     LOS: 3 days    Time spent:   Debarah Crape, DO [facility]  To contact the attending physician between 7A-7P please use Epic Chat. To contact the covering physician during after hours 7P-7A, please review Amion.   10/17/2023, 3:14 PM

## 2023-10-17 NOTE — Progress Notes (Signed)
   10/17/23 1219  PT Visit Information  Last PT Received On 10/17/23  Reason Eval/Treat Not Completed  (Pt refused repeating " I can't walk" and getting intermittently agitated when encouraged mobility and telling therapist to call her daughter "Selena Batten" PT spoke with pt's daughter via phone and daughter reports that pt seems more confused today-RN notified.)   PT will follow up as schedule allows.

## 2023-10-18 LAB — CBC WITH DIFFERENTIAL/PLATELET
Abs Immature Granulocytes: 0.06 10*3/uL (ref 0.00–0.07)
Basophils Absolute: 0 10*3/uL (ref 0.0–0.1)
Basophils Relative: 0 %
Eosinophils Absolute: 0.1 10*3/uL (ref 0.0–0.5)
Eosinophils Relative: 1 %
HCT: 26.4 % — ABNORMAL LOW (ref 36.0–46.0)
Hemoglobin: 8.2 g/dL — ABNORMAL LOW (ref 12.0–15.0)
Immature Granulocytes: 1 %
Lymphocytes Relative: 9 %
Lymphs Abs: 0.7 10*3/uL (ref 0.7–4.0)
MCH: 27.4 pg (ref 26.0–34.0)
MCHC: 31.1 g/dL (ref 30.0–36.0)
MCV: 88.3 fL (ref 80.0–100.0)
Monocytes Absolute: 0.6 10*3/uL (ref 0.1–1.0)
Monocytes Relative: 7 %
Neutro Abs: 6.5 10*3/uL (ref 1.7–7.7)
Neutrophils Relative %: 82 %
Platelets: 173 10*3/uL (ref 150–400)
RBC: 2.99 MIL/uL — ABNORMAL LOW (ref 3.87–5.11)
RDW: 14.8 % (ref 11.5–15.5)
WBC: 7.9 10*3/uL (ref 4.0–10.5)
nRBC: 0 % (ref 0.0–0.2)

## 2023-10-18 LAB — GLUCOSE, CAPILLARY
Glucose-Capillary: 172 mg/dL — ABNORMAL HIGH (ref 70–99)
Glucose-Capillary: 213 mg/dL — ABNORMAL HIGH (ref 70–99)
Glucose-Capillary: 213 mg/dL — ABNORMAL HIGH (ref 70–99)
Glucose-Capillary: 179 mg/dL — ABNORMAL HIGH (ref 70–99)

## 2023-10-18 NOTE — Progress Notes (Signed)
Physical Therapy Treatment Patient Details Name: [name] MRN: [id number] DOB: March 04, 1946 Today's Date: 10/18/2023   History of Present Illness Mrs. Interiano is a 77 yr old female admitted to the hospital 10-14-23 with suprapubic pain, nausea, vomiting, and fatigue. She was found to have UTI. PMH: rectal CA s/p colectomy, recurrent cervical adenocarcinoma, CVA, CAD, DM II, chronic pain, chronic anemia, arthritis, L THA    PT Comments  Pt requiring increased assist for all mobility this date compared to previous PT session. Pt required up to MOD A for sit to stand and step pivot to chair and up to MAX facilitation and cuing for taking shuffle steps with heavy posterior leaning. Of note, pt ambulated 36ft with use of RW and MIN A per PT evaluation note on 11/29. Updating d/c rec-  Pt will benefit from acute PT with follow up from continued inpatient follow up therapy, <3 hours/day to maximize functional mobility and independence.        If plan is discharge home, recommend the following: Two people to help with walking and/or transfers;A lot of help with bathing/dressing/bathroom;Assistance with cooking/housework;Supervision due to cognitive status;Help with stairs or ramp for entrance;Assist for transportation;Direct supervision/assist for financial management;Direct supervision/assist for medications management   Can travel by private vehicle     No  Equipment Recommendations  Rolling walker (2 wheels)    Recommendations for Other Services       Precautions / Restrictions Precautions Precautions: Fall Restrictions Weight Bearing Restrictions: No     Mobility  Bed Mobility Overal bed mobility: Needs Assistance Bed Mobility: Supine to Sit     Supine to sit: Min assist, HOB elevated, Used rails     General bed mobility comments: Increased time with assist to bring trunk to upright and cues for problem solving with hand placement for ease/efficency    Transfers Overall  transfer level: Needs assistance Equipment used: Rolling walker (2 wheels) Transfers: Sit to/from Stand, Bed to chair/wheelchair/BSC Sit to Stand: Mod assist   Step pivot transfers: Mod assist       General transfer comment: STS x2, MOD A for power up to stand from EOB. Pt with posterior leaning. initially pt with difficulty with upright posture and taking lateral or forward steps despite MAX facilitation from therapist for weight shift/offloading to initiate step. Recliner chair brought closer and on 2nd attempt pt able to perform shuffling of feet over to recliner chair. x2 steps with improved clearance once in front of chair. Increased fatigue. Unable to progress to forward ambulation this date.    Ambulation/Gait                   Stairs             Wheelchair Mobility     Tilt Bed    Modified Rankin (Stroke Patients Only)       Balance                                            Cognition Arousal: Alert Behavior During Therapy: WFL for tasks assessed/performed Overall Cognitive Status: No family/caregiver present to determine baseline cognitive functioning                                 General Comments: Pt much more alert and cooperative this date  compared to yesterday when therapist entered room. Oriented to person. Stated that she was at "[facility]" and that month was "April or May. PT reorienting pt.        Exercises      General Comments        Pertinent Vitals/Pain Pain Assessment Pain Assessment: Faces Faces Pain Scale: Hurts little more Pain Location: abdomen Pain Descriptors / Indicators: Discomfort Pain Intervention(s): Limited activity within patient's tolerance, Monitored during session, Repositioned    Home Living                          Prior Function            PT Goals (current goals can now be found in the care plan section) Acute Rehab PT Goals Patient Stated Goal:  Regain IND PT Goal Formulation: With patient Time For Goal Achievement: 10/29/23 Potential to Achieve Goals: Good Progress towards PT goals: Not progressing toward goals - comment    Frequency    Min 1X/week      PT Plan      Co-evaluation              AM-PAC PT "6 Clicks" Mobility   Outcome Measure  Help needed turning from your back to your side while in a flat bed without using bedrails?: A Lot Help needed moving from lying on your back to sitting on the side of a flat bed without using bedrails?: A Lot Help needed moving to and from a bed to a chair (including a wheelchair)?: A Lot Help needed standing up from a chair using your arms (e.g., wheelchair or bedside chair)?: A Lot Help needed to walk in hospital room?: Total Help needed climbing 3-5 steps with a railing? : Total 6 Click Score: 10    End of Session Equipment Utilized During Treatment: Gait belt Activity Tolerance: Patient limited by fatigue Patient left: in chair;with call bell/phone within reach;with chair alarm set Nurse Communication: Mobility status;Other (comment) (Advised for use of sara stedy to assist pt back to bed.) PT Visit Diagnosis: History of falling (Z91.81);Difficulty in walking, not elsewhere classified (R26.2)     Time: 9562-1308 PT Time Calculation (min) (ACUTE ONLY): 31 min  Charges:      PT General Charges $$ ACUTE PT VISIT: 1 Visit                    Lyman Speller PT, DPT  Acute Rehabilitation Services  Office [phone]  10/18/2023, 2:02 PM

## 2023-10-18 NOTE — Progress Notes (Signed)
PROGRESS NOTE    [name]  EAV:[id number] DOB: August 25, 1946 DOA: 10/14/2023 PCP: Lewis Moccasin, MD  Chief Complaint  Patient presents with   Weakness    Hospital Course:  [name] is 77 y.o. female with rectal adenocarcinoma status post colectomy, recurrent cervical adenocarcinoma, history of CVA, CAD, type 2 diabetes, hypothyroidism, chronic pain, chronic anemia, and recent admission for abdominal pain and GI bleed discharged on 11/27.  She reports she was feeling well at time of discharge but when she returned home began experiencing nausea, vomiting, suprapubic pain.  She [name] presented to the ED.  On arrival she was found to be febrile with a leukocytosis of 13.  Blood and urine cultures were collected and patient was admitted for further workup.  Subjective: No acute events overnight. On evaluation today patient is much more alert and oriented than prior   Objective: Vitals:   10/17/23 1154 10/17/23 2219 10/18/23 0500 10/18/23 0550  BP: (!) 144/52 (!) 164/55  (!) 151/51  Pulse: 77 79  70  Resp: 16 19  16   Temp: 98.8 F (37.1 C) 99.6 F (37.6 C)  98.1 F (36.7 C)  TempSrc: Oral Oral    SpO2: 94% 97%  96%  Weight:   68.3 kg   Height:        Intake/Output Summary (Last 24 hours) at 10/18/2023 0755 Last data filed at 10/18/2023 7829 Gross per 24 hour  Intake 1170 ml  Output 810 ml  Net 360 ml   Filed Weights   10/15/23 0115 10/17/23 0500 10/18/23 0500  Weight: 68.3 kg 65.9 kg 68.3 kg    Examination: General exam: Appears calm and comfortable, alert. Working with PT Respiratory system: No work of breathing, symmetric chest wall expansion Cardiovascular system: S1 & S2 heard, RRR.  Gastrointestinal system: Ostomy noted.  Abdomen is nondistended, tender to palpation in suprapubic region. Neuro: A & O x3. No focal neurological deficits. Extremities: Symmetric, expected ROM, no pedal edema. Skin: No rashes, lesions Psychiatry: calm, affect and mood  appropriate   Assessment & Plan:  Principal Problem:   Acute UTI Active Problems:   History of CVA (cerebrovascular accident)   Rectal cancer (HCC)   Hypothyroidism   DMII (diabetes mellitus, type 2) (HCC)   Essential hypertension   Coronary artery disease   Chronic diastolic CHF (congestive heart failure) (HCC)   Upper GI bleed   PUD (peptic ulcer disease)  UTI - Presents with fever leukocytosis and suprapubic pain.  UA compatible with infection.  -Cultures now positive for ESBL Klebsiella and Proteus, both sensitive to Merrem.  Has been on cefepime.  Switched to [facility] 11/30 -Blood cultures remain negative - Currently voiding freely, have ordered for bladder scan, high risk of retention.  Acute metabolic encephalopathy - Now back to baseline -- Some degree of underlying dementia - Suspect that this altered mental status is due to UTI.   Recent GI bleed Peptic ulcer disease Erosive gastritis Schatzki ring Duodenal stenosis -Status post EGD on 11/26 - No active bleeding at this time - Plavix was resumed on admission.  Hemoglobin is gradually downtrending.  Still stable above 7.  We will discontinue any further Plavix.  Discussed this with the patient's daughter is amendable - Continue PPI and Carafate  CAD - Appears stable with no anginal complaints - Continue statin only for now.  Hold aspirin and Plavix due to GI bleed as above.  Type 2 diabetes, with hyperglycemia - Hemoglobin A1c 5.7% - Continue sliding  scale insulin titrate up as tolerated  History of CVA - Stable.  Currently deconditioned worse than baseline.  Continue statin -- PT/OT  Hypothyroidism - Continue home dose Synthroid  Chronic pain - Continue home regimen for now  History of rectal and cervical cancer - Not currently undergoing active treatment - Follow-up with outpatient heme onco  BMI 29 - Outpatient follow up for lifestyle modification and risk factor management  Depression -  Continue home dose Zoloft  Hyperlipidemia - Continue statin  Chronic anemia - Hemoglobin baseline appears to be 9-10 - Appears to be downtrending.  Hold Plavix as above  Hyponatremia 130 on arrival, resolved now. - Continue to follow CMP  Heart failure with preserved EF Echocardiogram November 2024: LVEF 60 to 65%, grade 1 diastolic dysfunction Elevated blood pressure without history of hypertension - BP may be elevated in setting of acute pain. - Follow closely - Initiate amlodipine if continuing to uptrend. Currently DBP ~50s, unlikely to tolerate antihypertensive - PRN Lasix  Dementia -- Some issues with acute delirium this admission -- Cont close monitoring and frequent reorientation  Generalized deconditioning - Patient's daughter reports patient will not consider rehab at discharge.  Will likely go home with home health PT/OT    DVT prophylaxis: Lovenox Code Status: Full Family Communication: Tried to reach patient's daughter today, went to voicemail.  Will continue to try and reach her tomorrow. Disposition: Ongoing IV antibiotics, persistent found deconditioning.  Not yet ready for discharge. Would benefit from SNF/Rehab at least short term. Cont to discuss with daughter   Consultants:  none  Procedures:  none  Antimicrobials:  Anti-infectives (From admission, onward)    Start     Dose/Rate Route Frequency Ordered Stop   10/17/23 1100  meropenem (MERREM) 1 g in sodium chloride 0.9 % 100 mL IVPB        1 g 200 mL/hr over 30 Minutes Intravenous Every 12 hours 10/17/23 1012     10/15/23 0600  ceFEPIme (MAXIPIME) 2 g in sodium chloride 0.9 % 100 mL IVPB  Status:  Discontinued        2 g 200 mL/hr over 30 Minutes Intravenous Every 12 hours 10/14/23 2352 10/17/23 1012   10/14/23 2130  cefTRIAXone (ROCEPHIN) 1 g in sodium chloride 0.9 % 100 mL IVPB        1 g 200 mL/hr over 30 Minutes Intravenous  Once 10/14/23 2128 10/14/23 2233       Data Reviewed: I have  personally reviewed following labs and imaging studies CBC: Recent Labs  Lab 10/14/23 0426 10/14/23 2025 10/15/23 0509 10/16/23 0307 10/17/23 0302  WBC 6.5 13.0* 13.4* 10.3 8.4  NEUTROABS  --  12.0*  --   --   --   HGB 9.1* 10.9* 10.6* 9.2* 8.0*  HCT 30.0* 35.6* 34.8* 29.8* 25.7*  MCV 90.6 90.6 91.3 89.0 89.2  PLT 160 223 217 181 171   Basic Metabolic Panel: Recent Labs  Lab 10/14/23 0426 10/14/23 2025 10/15/23 0509 10/16/23 0307 10/17/23 0302  NA 138 130* 129* 130* 134*  K 3.3* 4.5 4.3 3.8 3.5  CL 106 100 98 101 102  CO2 22 18* 20* 20* 23  GLUCOSE 201* 246* 147* 153* 210*  BUN 35* 33* 33* 30* 24*  CREATININE 1.09* 1.01* 0.95 0.95 0.85  CALCIUM 7.9* 8.3* 8.1* 7.9* 7.8*   GFR: Estimated Creatinine Clearance: 47.8 mL/min (by C-G formula based on SCr of 0.85 mg/dL). Liver Function Tests: Recent Labs  Lab 10/12/23 1206 10/14/23  1610 10/14/23 2025  AST 19 13* 14*  ALT 12 9 8   ALKPHOS 121 71 83  BILITOT 1.0 0.7 1.3*  PROT 6.4* 5.6* 6.1*  ALBUMIN 3.3* 2.5* 2.9*   CBG: Recent Labs  Lab 10/17/23 0722 10/17/23 1152 10/17/23 1531 10/17/23 2155 10/18/23 0740  GLUCAP 169* 176* 168* 175* 172*    Recent Results (from the past 240 hour(s))  Culture, blood (Routine x 2)     Status: None (Preliminary result)   Collection Time: 10/14/23  8:25 PM   Specimen: BLOOD  Result Value Ref Range Status   Specimen Description   Final    BLOOD RIGHT ANTECUBITAL Performed at [facility], [address] [place], [place] [address]    Special Requests   Final    BOTTLES DRAWN AEROBIC AND ANAEROBIC Blood Culture results may not be optimal due to an inadequate volume of blood received in culture bottles Performed at [facility], [address], [place], [place] [address]    Culture   Final    NO GROWTH 3 DAYS Performed at [facility], [address], [place], [place] [address]    Report Status PENDING  Incomplete  Resp panel by  RT-PCR (RSV, Flu A&B, Covid) Anterior Nasal Swab     Status: None   Collection Time: 10/14/23  8:52 PM   Specimen: Anterior Nasal Swab  Result Value Ref Range Status   SARS Coronavirus 2 by RT PCR NEGATIVE NEGATIVE Final    Comment: (NOTE) SARS-CoV-2 target nucleic acids are NOT DETECTED.  The SARS-CoV-2 RNA is generally detectable in upper respiratory specimens during the acute phase of infection. The lowest concentration of SARS-CoV-2 viral copies this assay can detect is 138 copies/mL. A negative result does not preclude SARS-Cov-2 infection and should not be used as the sole basis for treatment or other patient management decisions. A negative result may occur with  improper specimen collection/handling, submission of specimen other than nasopharyngeal swab, presence of viral mutation(s) within the areas targeted by this assay, and inadequate number of viral copies(<138 copies/mL). A negative result must be combined with clinical observations, patient history, and epidemiological information. The expected result is Negative.  Fact Sheet for Patients:  BloggerCourse.com  Fact Sheet for Healthcare Providers:  SeriousBroker.it  This test is no t yet approved or cleared by the [place] FDA and  has been authorized for detection and/or diagnosis of SARS-CoV-2 by FDA under an Emergency Use Authorization (EUA). This EUA will remain  in effect (meaning this test can be used) for the duration of the COVID-19 declaration under Section 564(b)(1) of the Act, 21 U.S.C.section 360bbb-3(b)(1), unless the authorization is terminated  or revoked sooner.       Influenza A by PCR NEGATIVE NEGATIVE Final   Influenza B by PCR NEGATIVE NEGATIVE Final    Comment: (NOTE) The Xpert Xpress SARS-CoV-2/FLU/RSV plus assay is intended as an aid in the diagnosis of influenza from Nasopharyngeal swab specimens and should not be used as a sole basis  for treatment. Nasal washings and aspirates are unacceptable for Xpert Xpress SARS-CoV-2/FLU/RSV testing.  Fact Sheet for Patients: BloggerCourse.com  Fact Sheet for Healthcare Providers: SeriousBroker.it  This test is not yet approved or cleared by the [place] FDA and has been authorized for detection and/or diagnosis of SARS-CoV-2 by FDA under an Emergency Use Authorization (EUA). This EUA will remain in effect (meaning this test can be used) for the duration of the COVID-19 declaration under  Section 564(b)(1) of the Act, 21 U.S.C. section 360bbb-3(b)(1), unless the authorization is terminated or revoked.     Resp Syncytial Virus by PCR NEGATIVE NEGATIVE Final    Comment: (NOTE) Fact Sheet for Patients: BloggerCourse.com  Fact Sheet for Healthcare Providers: SeriousBroker.it  This test is not yet approved or cleared by the [place] FDA and has been authorized for detection and/or diagnosis of SARS-CoV-2 by FDA under an Emergency Use Authorization (EUA). This EUA will remain in effect (meaning this test can be used) for the duration of the COVID-19 declaration under Section 564(b)(1) of the Act, 21 U.S.C. section 360bbb-3(b)(1), unless the authorization is terminated or revoked.  Performed at [facility], [address], [place], [place] [address]   Urine Culture     Status: Abnormal   Collection Time: 10/14/23 10:04 PM   Specimen: Urine, Clean Catch  Result Value Ref Range Status   Specimen Description   Final    URINE, CLEAN CATCH Performed at [facility], [address], [place], [place] [address]    Special Requests   Final    NONE Performed at [facility], [address], [place], [place] [address]    Culture (A)  Final    >=100,000 COLONIES/mL KLEBSIELLA PNEUMONIAE 10,000 COLONIES/mL PROTEUS  MIRABILIS Confirmed Extended Spectrum Beta-Lactamase Producer (ESBL).  In bloodstream infections from ESBL organisms, carbapenems are preferred over piperacillin/tazobactam. They are shown to have a lower risk of mortality. KLEBSIELLA PNEUMONIAE    Report Status 10/17/2023 FINAL  Final   Organism ID, Bacteria KLEBSIELLA PNEUMONIAE (A)  Final   Organism ID, Bacteria PROTEUS MIRABILIS (A)  Final      Susceptibility   Klebsiella pneumoniae - MIC*    AMPICILLIN >=32 RESISTANT Resistant     CEFAZOLIN >=64 RESISTANT Resistant     CEFEPIME >=32 RESISTANT Resistant     CEFTRIAXONE >=64 RESISTANT Resistant     CIPROFLOXACIN 1 RESISTANT Resistant     GENTAMICIN >=16 RESISTANT Resistant     IMIPENEM <=0.25 SENSITIVE Sensitive     NITROFURANTOIN 64 INTERMEDIATE Intermediate     TRIMETH/SULFA >=320 RESISTANT Resistant     AMPICILLIN/SULBACTAM 16 INTERMEDIATE Intermediate     PIP/TAZO <=4 SENSITIVE Sensitive ug/mL    * >=100,000 COLONIES/mL KLEBSIELLA PNEUMONIAE   Proteus mirabilis - MIC*    AMPICILLIN <=2 SENSITIVE Sensitive     CEFAZOLIN 8 SENSITIVE Sensitive     CEFEPIME <=0.12 SENSITIVE Sensitive     CEFTRIAXONE <=0.25 SENSITIVE Sensitive     CIPROFLOXACIN <=0.25 SENSITIVE Sensitive     GENTAMICIN <=1 SENSITIVE Sensitive     IMIPENEM 2 SENSITIVE Sensitive     NITROFURANTOIN 128 RESISTANT Resistant     TRIMETH/SULFA <=20 SENSITIVE Sensitive     AMPICILLIN/SULBACTAM <=2 SENSITIVE Sensitive     PIP/TAZO <=4 SENSITIVE Sensitive ug/mL    * 10,000 COLONIES/mL PROTEUS MIRABILIS  Culture, blood (Routine x 2)     Status: None (Preliminary result)   Collection Time: 10/15/23  5:19 AM   Specimen: BLOOD RIGHT HAND  Result Value Ref Range Status   Specimen Description   Final    BLOOD RIGHT HAND Performed at [facility], [address] [place], [place] [address]    Special Requests   Final    BOTTLES DRAWN AEROBIC ONLY Blood Culture results may not be optimal due to an inadequate volume of  blood received in culture bottles Performed at [facility], [address] [place],  [place] [address]    Culture   Final    NO GROWTH 2 DAYS Performed at [facility], [address] [place], [place] [address]    Report Status PENDING  Incomplete     Radiology Studies: No results found.  Scheduled Meds:  atorvastatin  40 mg Oral QHS   baclofen  20 mg Oral BID   Chlorhexidine Gluconate Cloth  6 each Topical Daily   gabapentin  300 mg Oral Daily   gabapentin  600 mg Oral QHS   insulin aspart  0-5 Units Subcutaneous QHS   insulin aspart  0-6 Units Subcutaneous TID WC   levothyroxine  137 mcg Oral Q0600   oxyCODONE  10 mg Oral Q12H   pantoprazole  40 mg Oral BID AC   Followed by   Melene Muller ON 12/23/2023] pantoprazole  40 mg Oral Daily   sertraline  50 mg Oral QHS   sodium chloride flush  10-40 mL Intracatheter Q12H   sucralfate  1 g Oral TID WC & HS   Continuous Infusions:  meropenem (MERREM) IV 1 g (10/17/23 2127)     LOS: 4 days    Time spent:   Debarah Crape, DO [facility]  To contact the attending physician between 7A-7P please use Epic Chat. To contact the covering physician during after hours 7P-7A, please review Amion.   10/18/2023, 7:55 AM

## 2023-10-18 NOTE — Plan of Care (Signed)
  Problem: Fluid Volume: Goal: Ability to maintain a balanced intake and output will improve Outcome: Progressing   Problem: Health Behavior/Discharge Planning: Goal: Ability to manage health-related needs will improve Outcome: Progressing

## 2023-10-19 DIAGNOSIS — N390 Urinary tract infection, site not specified: Secondary | ICD-10-CM | POA: Diagnosis not present

## 2023-10-19 DIAGNOSIS — Z1624 Resistance to multiple antibiotics: Secondary | ICD-10-CM | POA: Diagnosis present

## 2023-10-19 DIAGNOSIS — D638 Anemia in other chronic diseases classified elsewhere: Secondary | ICD-10-CM | POA: Diagnosis not present

## 2023-10-19 LAB — COMPREHENSIVE METABOLIC PANEL
ALT: 13 U/L (ref 0–44)
AST: 19 U/L (ref 15–41)
Albumin: 1.9 g/dL — ABNORMAL LOW (ref 3.5–5.0)
Alkaline Phosphatase: 89 U/L (ref 38–126)
Anion gap: 9 (ref 5–15)
BUN: 18 mg/dL (ref 8–23)
CO2: 24 mmol/L (ref 22–32)
Calcium: 7.9 mg/dL — ABNORMAL LOW (ref 8.9–10.3)
Chloride: 102 mmol/L (ref 98–111)
Creatinine, Ser: 0.84 mg/dL (ref 0.44–1.00)
GFR, Estimated: >60 mL/min (ref >60)
Glucose, Bld: 281 mg/dL — ABNORMAL HIGH (ref 70–99)
Potassium: 3 mmol/L — ABNORMAL LOW (ref 3.5–5.1)
Sodium: 135 mmol/L (ref 135–145)
Total Bilirubin: 0.2 mg/dL (ref <1.2)
Total Protein: 5.1 g/dL — ABNORMAL LOW (ref 6.5–8.1)

## 2023-10-19 LAB — CULTURE, BLOOD (ROUTINE X 2): Culture: NO GROWTH

## 2023-10-19 LAB — CBC WITH DIFFERENTIAL/PLATELET
Abs Immature Granulocytes: 0.07 10*3/uL (ref 0.00–0.07)
Basophils Absolute: 0 10*3/uL (ref 0.0–0.1)
Basophils Relative: 0 %
Eosinophils Absolute: 0.1 10*3/uL (ref 0.0–0.5)
Eosinophils Relative: 1 %
HCT: 26.6 % — ABNORMAL LOW (ref 36.0–46.0)
Hemoglobin: 7.8 g/dL — ABNORMAL LOW (ref 12.0–15.0)
Immature Granulocytes: 1 %
Lymphocytes Relative: 9 %
Lymphs Abs: 0.7 10*3/uL (ref 0.7–4.0)
MCH: 27 pg (ref 26.0–34.0)
MCHC: 29.3 g/dL — ABNORMAL LOW (ref 30.0–36.0)
MCV: 92 fL (ref 80.0–100.0)
Monocytes Absolute: 0.6 10*3/uL (ref 0.1–1.0)
Monocytes Relative: 8 %
Neutro Abs: 5.9 10*3/uL (ref 1.7–7.7)
Neutrophils Relative %: 81 %
Platelets: 187 10*3/uL (ref 150–400)
RBC: 2.89 MIL/uL — ABNORMAL LOW (ref 3.87–5.11)
RDW: 14.8 % (ref 11.5–15.5)
WBC: 7.6 10*3/uL (ref 4.0–10.5)
nRBC: 0 % (ref 0.0–0.2)

## 2023-10-19 LAB — GLUCOSE, CAPILLARY
Glucose-Capillary: 220 mg/dL — ABNORMAL HIGH (ref 70–99)
Glucose-Capillary: 249 mg/dL — ABNORMAL HIGH (ref 70–99)
Glucose-Capillary: 284 mg/dL — ABNORMAL HIGH (ref 70–99)
Glucose-Capillary: 186 mg/dL — ABNORMAL HIGH (ref 70–99)

## 2023-10-19 LAB — PREPARE RBC (CROSSMATCH)

## 2023-10-19 LAB — PHOSPHORUS
Phosphorus: 1.2 mg/dL — ABNORMAL LOW (ref 2.5–4.6)
Phosphorus: 3.1 mg/dL (ref 2.5–4.6)

## 2023-10-19 LAB — MAGNESIUM: Magnesium: 1.4 mg/dL — ABNORMAL LOW (ref 1.7–2.4)

## 2023-10-19 MED ORDER — SODIUM CHLORIDE 0.9% IV SOLUTION
Freq: Once | INTRAVENOUS | Status: AC
Start: 2023-10-19 — End: 2023-10-19

## 2023-10-19 MED ORDER — POTASSIUM PHOSPHATES 15 MMOLE/5ML IV SOLN
45.0000 mmol | Freq: Once | INTRAVENOUS | Status: AC
  Administered 2023-10-19: 45 mmol via INTRAVENOUS
  Filled 2023-10-19: qty 15

## 2023-10-19 MED ORDER — POTASSIUM CHLORIDE CRYS ER 20 MEQ PO TBCR: 40.0000 meq | EXTENDED_RELEASE_TABLET | Freq: Once | ORAL | Status: DC

## 2023-10-19 MED ORDER — HEPARIN SOD (PORK) LOCK FLUSH 100 UNIT/ML IV SOLN: 500.0000 [IU] | INTRAVENOUS | Status: DC | PRN

## 2023-10-19 MED ORDER — MAGNESIUM SULFATE 4 GM/100ML IV SOLN
4.0000 g | Freq: Once | INTRAVENOUS | Status: AC
  Administered 2023-10-19: 4 g via INTRAVENOUS
  Filled 2023-10-19: qty 100

## 2023-10-19 MED ORDER — BACLOFEN 10 MG PO TABS: 10.0000 mg | ORAL_TABLET | Freq: Two times a day (BID) | ORAL | 0 refills | Status: DC

## 2023-10-19 MED ORDER — FOSFOMYCIN TROMETHAMINE 3 G PO PACK: 3.0000 g | PACK | Freq: Every day | ORAL | 0 refills | Status: DC

## 2023-10-19 NOTE — Inpatient Diabetes Management (Signed)
Inpatient Diabetes Program Recommendations  AACE/ADA: New Consensus Statement on Inpatient Glycemic Control (2015)  Target Ranges:  Prepandial:   less than 140 mg/dL      Peak postprandial:   less than 180 mg/dL (1-2 hours)      Critically ill patients:  140 - 180 mg/dL   Lab Results  Component Value Date   GLUCAP 220 (H) 10/19/2023   HGBA1C 5.7 (H) 09/10/2023    Review of Glycemic Control  Latest Reference Range & Units 10/18/23 07:40 10/18/23 11:47 10/18/23 16:43 10/18/23 21:39 10/19/23 07:19  Glucose-Capillary 70 - 99 mg/dL 161 (H) 096 (H) 045 (H) 179 (H) 220 (H)   Diabetes history: DM 2 Outpatient Diabetes medications:  Metformin 500 mg bid Current orders for Inpatient glycemic control:  Novolog 0-6 units tid with meals and HS  Inpatient Diabetes Program Recommendations:    Consider adding Semglee 8 units daily while in the hospital.   Thanks,  Beryl Meager, RN, BC-ADM Inpatient Diabetes Coordinator Pager [phone]  (8a-5p)

## 2023-10-19 NOTE — Progress Notes (Deleted)
Discharge instructions given to patient and all questions were answered.  

## 2023-10-19 NOTE — Progress Notes (Signed)
Physical Therapy Treatment Patient Details Name: [name] MRN: [id number] DOB: 06-04-1946 Today's Date: 10/19/2023   History of Present Illness Mrs. Kindig is a 77 yr old female admitted to the hospital 10-14-23 with suprapubic pain, nausea, vomiting, and fatigue. She was found to have UTI. PMH: rectal CA s/p colectomy, recurrent cervical adenocarcinoma, CVA, CAD, DM II, chronic pain, chronic anemia, arthritis, L THA    PT Comments   Pt admitted with above diagnosis.  Pt currently with functional limitations due to the deficits listed below (see PT Problem List). Pt in bed when PT arrived. Pt agreeable to therapy intervention. Pt required min A for supine to sit, min A for sit to stand  from EOB with cues, gait tasks with RW, min A x 2 with daughter providing some assist, recliner close for 20 feet. Pt exhibited B LE instability, decreased to occasional absent R foot clearance and slight R toe out attributed to late effects of CVA and at baseline with gait tasks and use of rollator in home setting per pt and daughter. Daughter indicated that pt would not go to SNF for rehab and that family is able to provide support at assist for pt in home setting. Pt left seated in recliner, all needs in place and daughter present. Pt denies pain, SOB or dizziness.   Pt will benefit from acute skilled PT to increase their independence and safety with mobility to allow discharge.      If plan is discharge home, recommend the following: Two people to help with walking and/or transfers;A lot of help with bathing/dressing/bathroom;Assistance with cooking/housework;Supervision due to cognitive status;Help with stairs or ramp for entrance;Assist for transportation;Direct supervision/assist for financial management;Direct supervision/assist for medications management   Can travel by private vehicle     No  Equipment Recommendations  Rolling walker (2 wheels)    Recommendations for Other Services        Precautions / Restrictions Precautions Precautions: Fall Restrictions Weight Bearing Restrictions: No     Mobility  Bed Mobility Overal bed mobility: Needs Assistance Bed Mobility: Supine to Sit     Supine to sit: Min assist, HOB elevated, Used rails     General bed mobility comments: Increased time with assist to bring trunk and to scoot to EOB with cues    Transfers Overall transfer level: Needs assistance Equipment used: Rolling walker (2 wheels) Transfers: Sit to/from Stand Sit to Stand: Min assist           General transfer comment: pt required cues and push to stand from EOB to RW wtih noted B LE instability and cues for extension posture    Ambulation/Gait Ambulation/Gait assistance: Min assist, +2 safety/equipment (recliner close) Gait Distance (Feet): 20 Feet Assistive device: Rolling walker (2 wheels) Gait Pattern/deviations: Step-to pattern, Decreased step length - right, Decreased step length - left, Trunk flexed, Decreased stance time - right Gait velocity: decr     General Gait Details: cues for posture and position from RW;  Noted intermittent dragging on R foot and slight toe out (daughter states residual from prior CVA).  Assist for stability and RW management   Stairs             Wheelchair Mobility     Tilt Bed    Modified Rankin (Stroke Patients Only)       Balance Overall balance assessment: Needs assistance Sitting-balance support: Feet supported Sitting balance-Leahy Scale: Good Sitting balance - Comments: Static sitting-good   Standing balance support: Bilateral upper  extremity supported Standing balance-Leahy Scale: Poor Standing balance comment: Min assist with RW                            Cognition Arousal: Alert Behavior During Therapy: WFL for tasks assessed/performed Overall Cognitive Status: Impaired/Different from baseline Area of Impairment: Attention, Memory                                General Comments: daughter indicated her memory was improving as pt could recall part of phone converstation from night before        Exercises      General Comments        Pertinent Vitals/Pain Pain Assessment Pain Assessment: No/denies pain    Home Living                          Prior Function            PT Goals (current goals can now be found in the care plan section) Acute Rehab PT Goals Patient Stated Goal: Regain IND PT Goal Formulation: With patient Time For Goal Achievement: 10/29/23 Potential to Achieve Goals: Good Progress towards PT goals: Progressing toward goals    Frequency    Min 1X/week      PT Plan      Co-evaluation              AM-PAC PT "6 Clicks" Mobility   Outcome Measure  Help needed turning from your back to your side while in a flat bed without using bedrails?: A Little Help needed moving from lying on your back to sitting on the side of a flat bed without using bedrails?: A Little Help needed moving to and from a bed to a chair (including a wheelchair)?: A Little Help needed standing up from a chair using your arms (e.g., wheelchair or bedside chair)?: A Little Help needed to walk in hospital room?: A Little Help needed climbing 3-5 steps with a railing? : Total 6 Click Score: 16    End of Session Equipment Utilized During Treatment: Gait belt Activity Tolerance: Patient limited by fatigue Patient left: in chair;with call bell/phone within reach;with chair alarm set;with family/visitor present Nurse Communication: Mobility status PT Visit Diagnosis: History of falling (Z91.81);Difficulty in walking, not elsewhere classified (R26.2)     Time: 1000-1026 PT Time Calculation (min) (ACUTE ONLY): 26 min  Charges:    $Gait Training: 8-22 mins $Therapeutic Activity: 8-22 mins PT General Charges $$ ACUTE PT VISIT: 1 Visit                     Johnny Bridge, PT Acute Rehab    Jacqualyn Posey 10/19/2023,  1:31 PM

## 2023-10-19 NOTE — TOC Transition Note (Signed)
Transition of Care [facility]) - CM/SW Discharge Note   Patient Details  Name: [name] MRN: [id number] Date of Birth: 11/28/1945  Transition [facility] CM/SW Contact:  Darleene Cleaver, LCSW Phone Number: 10/19/2023, 1:51 PM   Clinical Narrative:     CSW received message that patient's daughter Cala Bradford had questions regarding home health.  CSW spoke to patient's daughter, and informed her that [facility] has accepted patient for [facility] PT, OT, RN, Aide, and Social work.  CSW asked if patient needed any medical equipment, per patient's daughter they do not.  Patient has several pieces of equipment at the home already.  PT recommended SNF, however patient and family would rather have her come home with home health.  CSW updated Tresa Endo at [facility] that patient will be discharging today, per Tresa Endo patient will be seen within 24-48 hours.  Patient's daughter did not express any other issues or concerns.  TOC signing off, please reconsult if TOC needs arise.  Final next level of care: Home w Home Health Services Barriers to Discharge: Barriers Resolved   Patient Goals and CMS Choice CMS Medicare.gov Compare Post Acute Care list provided to:: Patient Represenative (must comment) Choice offered to / list presented to : Adult Children  Discharge Placement  Home with home health.                       Discharge Plan and Services Additional resources added to the After Visit Summary for                            [facility] Arranged: PT, OT, Nurse's Aide, Social Work, Refused SNF, RN HH Agency: [facility] Date [facility] Agency Contacted: 10/19/23 Time HH Agency Contacted: [phone] Representative spoke with at [facility] Agency: Tresa Endo  Social Determinants of Health (SDOH) Interventions SDOH Screenings   Food Insecurity: Patient Unable To Answer (10/14/2023)  Housing: Low Risk  (10/15/2023)  Recent Concern: Housing - High Risk (10/14/2023)  Transportation Needs: Patient Unable To  Answer (10/14/2023)  Utilities: Patient Unable To Answer (10/14/2023)  Depression (PHQ2-9): Low Risk  (01/17/2020)  Tobacco Use: Medium Risk (10/14/2023)     Readmission Risk Interventions    10/15/2023    9:49 AM 09/11/2023   11:36 AM 12/02/2021    1:55 PM  Readmission Risk Prevention Plan  Transportation Screening Complete Complete Complete  PCP or Specialist Appt within 3-5 Days  Complete   HRI or Home Care Consult  Complete   Social Work Consult for Recovery Care Planning/Counseling  Complete   Palliative Care Screening  Not Applicable   Medication Review [employer]) Referral to Pharmacy Complete Complete  PCP or Specialist appointment within 3-5 days of discharge Complete  Complete  HRI or Home Care Consult Complete  Complete  SW Recovery Care/Counseling Consult Complete  Complete  Palliative Care Screening Not Applicable  Not Applicable  Skilled Nursing Facility Not Applicable  Patient Refused

## 2023-10-19 NOTE — Consult Note (Addendum)
Consultation  Referring Provider: DO Dezii      Primary Care Physician:  Lewis Moccasin, MD Primary Gastroenterologist:        Dr. Chales Abrahams from recent admission Reason for Consultation: GI Bleed             HPI:   [name] is a 77 y.o. female with a past medical history significant for rectal adenocarcinoma (status post colectomy treated with chemo at [facility]) and recurrent cervical adenocarcinoma (5/22), hypertension, CVA on Plavix, diabetes, hypothyroidism, chronic wounds, neuropathy and chronic anemia, who we are consulted on today in regards to an acute on chronic anemia with a 3 g hemoglobin drop.    09/11/2023 consulted by our service for an upper GI bleed for GI bleed. At that time suspected lower GI bleed with acute on chronic anemia in the setting of Plavix and aspirin with a history of CVA. EGD at the time of duodenal ulcers with strictures which was thought the likely etiology of upper GI bleed with no active bleeding and erosive gastritis. Patient treated with IV Protonix. Recommended repeating EGD in 12 weeks.     10/12/2023 consult by our service for a GI bleed.  At that time discussed with vomiting episodes with coffee grounds.  At that point recommended an enteroscopy.    10/13/2023 enteroscopy with Dr. Leonides Schanz with nonobstructing Schatzki's ring, gastritis, duodenal stenosis and a single nonbleeding angiectasia in the duodenum treated with APC.  It was thought that coffee-ground emesis may be related to gastritis.  Continued on a PPI twice daily for 10 weeks as well as Carafate 1 g 4 times daily for 4 weeks.  Recommended HIDA scan for further evaluation of distended gallbladder and due to epigastric abdominal pain.  Discussed that she may need reevaluation of rectal mass in the setting of rectal cancer.  This included a rectal EUS for CT with rectal contrast first PET/CT for further evaluation.  Dr. Elnita Maxwell reported that the patient refused all further treatment for her  rectal cancer due to intolerance of chemotherapy.    10/14/2023 patient signed off.  HIDA scan had shown delayed gallbladder filling.  EF could not be calculated given only partial filling of the gallbladder.  Discussed possible chronic acalculous cholecystitis.  Hemoglobin remained stable.    10/14/2023 surgical team saw the patient and signed off.  There was no role for acute surgical intervention.She was discharged home.    10/14/23 patient re-admitted to the hospital within a few hours with AMS and UTI.    10/16/2023 patient's Plavix was resumed.    10/18/2023 noted that hemoglobin was gradually downtrending continued on PPI and Carafate.    Today, patient is found sleeping with her daughter by her bedside who does assist with her care.  She explains that patient was discharged on Wednesday but when she got home had an altered mental status which is typically indicative of a UTI.  She was found to have one in the ER and readmitted.  Her mental status has cleared over the last day or so and she is much better.  There have been no signs of overt GI bleeding, no nausea, vomiting, hematemesis, abdominal pain or blood in her ostomy bag.    Denies fever or chills.  Previous GI workup: HIDA 11/27 IMPRESSION: 1. Patent common bile duct. 2. Delayed filling of the gallbladder. Ejection fraction could not be performed as only partial filling of the gallbladder at 2 hours imaging   Small bowel  endoscopy 10/13/23 - Non-obstructing Schatzki ring. - Gastritis. Biopsied. - Duodenal stenosis. - A single non-bleeding angioectasia in the duodenum. Treated with argon plasma coagulation (APC).   FINAL MICROSCOPIC DIAGNOSIS:   A. STOMACH, BIOPSY:       Gastric antral / oxyntic mucosa with chronic inactive gastritis.       No H. pylori identified on HE stain.       Negative for intestinal metaplasia or dysplasia.  09/11/2023 EGD with Dr. Chales Abrahams with 2 white open distal esophageal rings noted in the third  portion of the esophagus, just above the GE junction, a few localized 2 to 4 mm erosions with no bleeding and no stigmata of bleeding in the gastric antrum, 112 mm nonbleeding deep cratered duodenal ulcer with a clean ulcer base in the D1/D2 junction, erythematous margins and duodenal narrowing/stricture, linear ulcer noted just beyond this ulcer and proximal to the second portion of the duodenum, another third nonbleeding linear duodenal ulcer with a clean ulcer base in the second portion of the duodenum with stricture   01/08/2022 EGD with Dr. Orvan Falconer for gastric ulcer and persistent symptoms.normal esophagus resolution of gastric ulcers and duodenal ulcer. Erythematous mucosa in the gastric body and atrium.  Biopsied.  Mucosal changes in the duodenum.  Biopsied   Diagnosis 1. Surgical [P], duodenal bulb, 2nd portion of duodenum, and distal duodenum MILD ACUTE DUODENITIS WITH GASTRIC METAPLASIA CONSISTENT WITH PEPTIC DUODENITIS 2. Surgical [P], fundus, gastric antrum, and gastric body REACTIVE GASTROPATHY WITH FOVEOLAR HYPERPLASIA AND FOCAL SURFACE EROSION MILD CHRONIC GASTRITIS NEGATIVE FOR H. PYLORI, INTESTINAL METAPLASIA, DYSPLASIA AND CARCINOMA   10/12/21 EGD with Dr. Orvan Falconer for Nausea, vomiting, progressive anemia and abnormal duodenal wall thickening seen on CT in the setting of locally advanced inoperable rectal adenocarcinoma with LN mets   Impression: - Symptomatic gastric ulcer and duodenal ulcer disease. Biopsies pending. - Possible esophageal plaques were found, suspicious for candidiasis. Biopsied. - LA Grade A reflux esophagitis with no bleeding. - Mucosal changes in the duodenum of unclear clinical significance. Biopsied.   FINAL MICROSCOPIC DIAGNOSIS:   A. DUODENAL BULB, BIOPSY:  -  Peptic duodenitis  -  No dysplasia or malignancy identified   B. GASTRIC, BIOPSY:  -  Chronic active gastritis  -  No intestinal metaplasia identified  -  See comment   C. ESOPHAGEAL, BIOPSY:   -  Acute fungal esophagitis  -  See comment    01/23/21 colonoscopy with Dr Lavon Paganini for Evaluation of unexplained GI bleeding presenting with Hematochezia, Evaluation of unexplained GI bleeding presenting with fecal occult blood, Lower abdominal pain, Unexplained iron deficiency anemia; Impression: - Stricture in the recto- sigmoid colon. Biopsied. - Rule out malignancy, partially obstructing tumor in the recto- sigmoid colon. Biopsied. Tattooed.   FINAL MICROSCOPIC DIAGNOSIS:   A. RECTUM, BIOPSY:  -  Adenocarcinoma arising in a background of high-grade dysplasia  -  See comment     Past Medical History:  Diagnosis Date   Arthritis    Basal ganglia stroke [facility])    March 2022   Bilateral lower extremity edema    Cancer (HCC)    twice- colon cancer, unsure of the other type of cancer   Carotid artery occlusion    Flank pain    Full dentures    History of cervical cancer    11/ 1994  Stage IB  s/p  high dose radiation brachytherapy @ [facility] 01/ 1995--- per pt no recurrence   History of chronic bronchitis    History  of hyperthyroidism    d 03/ 2011---due to grave's disease--- s/p  RAI treatment 04/ 2011   History of kidney stones    History of sepsis 05/03/2018   due to UTI with klebsiella/ pyelonephritis/ ureteral obstruction cause by stone   Hyperlipidemia    Hypothyroidism, postradioiodine therapy    endocrinologist--  dr Everardo All--  dx graves disease and s/p RAI i131 treatement 4/ 2011   Nauseated    Oral thrush 05/03/2018   Pneumonia    Type 2 diabetes mellitus treated with insulin (HCC)    FOLLOWED BY PCP   Urgency of urination    Wears glasses     Past Surgical History:  Procedure Laterality Date   APPENDECTOMY     BIOPSY  01/23/2021   Procedure: BIOPSY;  Surgeon: Napoleon Form, MD;  Location: [facility];  Service: Endoscopy;;   BIOPSY  10/12/2021   Procedure: BIOPSY;  Surgeon: Tressia Danas, MD;  Location: [facility];  Service: Gastroenterology;;    BIOPSY  09/11/2023   Procedure: BIOPSY;  Surgeon: Lynann Bologna, MD;  Location: [facility];  Service: Gastroenterology;;   BIOPSY  10/13/2023   Procedure: BIOPSY;  Surgeon: Imogene Burn, MD;  Location: [facility];  Service: Gastroenterology;;   CAROTID ENDARTERECTOMY Right 03/28/2020   CATARACT EXTRACTION W/ INTRAOCULAR LENS  IMPLANT, BILATERAL  2004  approx.   COLONOSCOPY     COLONOSCOPY WITH PROPOFOL N/A 01/23/2021   Procedure: COLONOSCOPY WITH PROPOFOL;  Surgeon: Napoleon Form, MD;  Location: [facility];  Service: Endoscopy;  Laterality: N/A;   CYSTOSCOPY WITH STENT PLACEMENT Right 05/04/2018   Procedure: CYSTOSCOPY WITH STENT PLACEMENT AND RETROGRADE PYELOGRAM;  Surgeon: Rene Paci, MD;  Location: [facility];  Service: Urology;  Laterality: Right;   CYSTOSCOPY/URETEROSCOPY/HOLMIUM LASER/STENT PLACEMENT Right 05/19/2018   Procedure: CYSTOSCOPY, URETEROSCOPY/HOLMIUM LASER, STONE BASKETRY/ STENT EXCHANGE;  Surgeon: Rene Paci, MD;  Location: [facility];  Service: Urology;  Laterality: Right;   ENDARTERECTOMY Right 03/28/2020   Procedure: RIGHT CAROTID ENDARTERECTOMY with PATCH ANGIOPLASTY;  Surgeon: Larina Earthly, MD;  Location: [facility] OR;  Service: Vascular;  Laterality: Right;   ENTEROSCOPY N/A 10/13/2023   Procedure: ENTEROSCOPY;  Surgeon: Imogene Burn, MD;  Location: [facility];  Service: Gastroenterology;  Laterality: N/A;   ESOPHAGOGASTRODUODENOSCOPY (EGD) WITH PROPOFOL N/A 10/12/2021   Procedure: ESOPHAGOGASTRODUODENOSCOPY (EGD) WITH PROPOFOL;  Surgeon: Tressia Danas, MD;  Location: [facility];  Service: Gastroenterology;  Laterality: N/A;   ESOPHAGOGASTRODUODENOSCOPY (EGD) WITH PROPOFOL N/A 09/11/2023   Procedure: ESOPHAGOGASTRODUODENOSCOPY (EGD) WITH PROPOFOL;  Surgeon: Lynann Bologna, MD;  Location: [facility];  Service: Gastroenterology;  Laterality: N/A;   EXCISION MASS LEFT CHEST WALL  09-20-2007   dr Janee Morn  [facility]    HOT HEMOSTASIS  10/13/2023   Procedure: HOT HEMOSTASIS (ARGON PLASMA COAGULATION/BICAP);  Surgeon: Imogene Burn, MD;  Location: [facility];  Service: Gastroenterology;;   Radioactive Iodine Therapy     for thyroid   REVISION TOTAL HIP ARTHROPLASTY Left early 2000s   SUBMUCOSAL TATTOO INJECTION  01/23/2021   Procedure: SUBMUCOSAL TATTOO INJECTION;  Surgeon: Napoleon Form, MD;  Location: [facility];  Service: Endoscopy;;   TANDEM RING INSERTION  1995   dr Loree Fee @ [facility]   EUA w/ tandem placement in ovid  (for direct high dose radiation brachytherapy , cervical cancer)   TOTAL HIP ARTHROPLASTY Left 1990s   TRANSTHORACIC ECHOCARDIOGRAM  08/07/2017   mild focal basal hypertrophy of the septum,  ef 60-65%, grade 1 diastolic dysfunction/  atrial septum with lipomatous hypertrophy/  trivial PR   UPPER GASTROINTESTINAL ENDOSCOPY      Family History  Problem Relation Age of Onset   Emphysema Father    Diabetes Father    Emphysema Sister    Emphysema Brother    Cancer Mother        Skin Cancer   Diabetes Mother    Colon cancer Neg Hx    Esophageal cancer Neg Hx    Rectal cancer Neg Hx    Stomach cancer Neg Hx      Social History   Tobacco Use   Smoking status: Former    Current packs/day: 0.00    Types: Cigarettes    Start date: 05/13/1977    Quit date: 05/13/1982    Years since quitting: 41.4    Passive exposure: Past   Smokeless tobacco: Never  Vaping Use   Vaping status: Never Used  Substance Use Topics   Alcohol use: No   Drug use: No    Prior to Admission medications   Medication Sig Start Date End Date Taking? Authorizing Provider  acetaminophen (TYLENOL) 500 MG tablet Take 500 mg by mouth daily as needed for moderate pain (pain score 4-6) or headache.   Yes [provider]  atorvastatin (LIPITOR) 40 MG tablet Take 40 mg by mouth at bedtime. 12/15/19  Yes [provider]  clopidogrel (PLAVIX) 75 MG tablet Take 1 tablet (75 mg total) by  mouth at bedtime. Patient taking differently: Take 75 mg by mouth daily. 10/12/21  Yes Willeen Niece, MD  fosfomycin (MONUROL) 3 g PACK Take 3 g by mouth daily for 2 days. 10/19/23 10/21/23 Yes Dezii, Alexandra, DO  gabapentin (NEURONTIN) 300 MG capsule Take 300-600 mg by mouth See admin instructions. Take one capsule (300 mg) by mouth every morning and two capsules (600 mg) at night   Yes [provider]  levothyroxine (SYNTHROID) 137 MCG tablet Take 137 mcg by mouth daily before breakfast.   Yes [provider]  MELATONIN PO Take 1 tablet by mouth at bedtime.   Yes [provider]  metFORMIN (GLUCOPHAGE) 500 MG tablet Take 500 mg by mouth 2 (two) times daily with a meal. 02/01/21  Yes [provider]  oxyCODONE ER (XTAMPZA ER) 9 MG C12A Take 9 mg by mouth in the morning and at bedtime.   Yes [provider]  pantoprazole (PROTONIX) 40 MG tablet Take 1 tablet (40 mg total) by mouth 2 (two) times daily before a meal for 70 days, THEN 1 tablet (40 mg total) daily. 10/14/23 03/22/24 Yes Pokhrel, Laxman, MD  sertraline (ZOLOFT) 50 MG tablet Take 50 mg by mouth at bedtime.   Yes [provider]  ascorbic acid (VITAMIN C) 500 MG tablet Take 1 tablet (500 mg total) by mouth daily. 12/03/21   Rolly Salter, MD  baclofen (LIORESAL) 10 MG tablet Take 1 tablet (10 mg total) by mouth 2 (two) times daily. 10/19/23   Dezii, Alexandra, DO  gemfibrozil (LOPID) 600 MG tablet Take 600 mg by mouth 2 (two) times daily.    [provider]  Homeopathic Products (LEG CRAMPS) TABS Take 1 tablet by mouth 2 (two) times daily as needed (leg pain).    [provider]  MAGnesium-Oxide 400 (240 Mg) MG tablet Take 1 tablet by mouth twice daily 08/10/23   Ladene Artist, MD  nystatin (MYCOSTATIN/NYSTOP) powder Apply 1 Application topically 2 (two) times daily. To groin rash Patient taking differently: Apply  1 Application topically 2 (two) times daily as needed  (groin irritation, rash). 07/24/23   Ladene Artist, MD  sucralfate (CARAFATE) 1 GM/10ML suspension Take 10 mLs (1 g total) by mouth 4 (four) times daily -  with meals and at bedtime for 28 days. 10/14/23 11/11/23  Pokhrel, Rebekah Chesterfield, MD    Current Facility-Administered Medications  Medication Dose Route Frequency Provider Last Rate Last Admin   acetaminophen (TYLENOL) tablet 650 mg  650 mg Oral Q6H PRN Opyd, Lavone Neri, MD   650 mg at 10/18/23 1532   Or   acetaminophen (TYLENOL) suppository 650 mg  650 mg Rectal Q6H PRN Opyd, Lavone Neri, MD       atorvastatin (LIPITOR) tablet 40 mg  40 mg Oral QHS Opyd, Lavone Neri, MD   40 mg at 10/18/23 2146   baclofen (LIORESAL) tablet 20 mg  20 mg Oral BID Briscoe Deutscher, MD   20 mg at 10/19/23 7253   Chlorhexidine Gluconate Cloth 2 % PADS 6 each  6 each Topical Daily Dezii, Gordy Councilman, DO   6 each at 10/19/23 6644   gabapentin (NEURONTIN) capsule 300 mg  300 mg Oral Daily Opyd, Lavone Neri, MD   300 mg at 10/19/23 0347   gabapentin (NEURONTIN) capsule 600 mg  600 mg Oral QHS Opyd, Lavone Neri, MD   600 mg at 10/18/23 2145   heparin lock flush 100 unit/mL  500 Units Intracatheter Prior to discharge Dezii, Alexandra, DO       insulin aspart (novoLOG) injection 0-5 Units  0-5 Units Subcutaneous QHS Briscoe Deutscher, MD   2 Units at 10/16/23 2131   insulin aspart (novoLOG) injection 0-6 Units  0-6 Units Subcutaneous TID WC Opyd, Lavone Neri, MD   2 Units at 10/19/23 1111   levothyroxine (SYNTHROID) tablet 137 mcg  137 mcg Oral Q0600 Briscoe Deutscher, MD   137 mcg at 10/19/23 0552   melatonin tablet 3 mg  3 mg Oral QHS PRN Opyd, Lavone Neri, MD       meropenem (MERREM) 1 g in sodium chloride 0.9 % 100 mL IVPB  1 g Intravenous Q12H Dezii, Alexandra, DO 200 mL/hr at 10/19/23 0950 1 g at 10/19/23 0950   ondansetron (ZOFRAN) tablet 4 mg  4 mg Oral Q6H PRN Opyd, Lavone Neri, MD       Or   ondansetron (ZOFRAN) injection 4 mg  4 mg Intravenous Q6H PRN Opyd, Lavone Neri, MD        oxyCODONE (OXYCONTIN) 12 hr tablet 10 mg  10 mg Oral Q12H Opyd, Lavone Neri, MD   10 mg at 10/19/23 0911   pantoprazole (PROTONIX) EC tablet 40 mg  40 mg Oral BID AC Opyd, Lavone Neri, MD   40 mg at 10/19/23 0737   Followed by   Melene Muller ON 12/23/2023] pantoprazole (PROTONIX) EC tablet 40 mg  40 mg Oral Daily Opyd, Lavone Neri, MD       potassium PHOSPHATE 45 mmol in dextrose 5 % 500 mL infusion  45 mmol Intravenous Once Dezii, Alexandra, DO 64.4 mL/hr at 10/19/23 0817 45 mmol at 10/19/23 0817   senna-docusate (Senokot-S) tablet 1 tablet  1 tablet Oral QHS PRN Opyd, Lavone Neri, MD       sertraline (ZOLOFT) tablet 50 mg  50 mg Oral QHS Opyd, Lavone Neri, MD   50 mg at 10/18/23 2146   sodium chloride flush (NS) 0.9 % injection 10-40 mL  10-40 mL Intracatheter Q12H Dezii, Alexandra, DO  10 mL at 10/16/23 0845   sodium chloride flush (NS) 0.9 % injection 10-40 mL  10-40 mL Intracatheter PRN Dezii, Alexandra, DO       sucralfate (CARAFATE) 1 GM/10ML suspension 1 g  1 g Oral TID WC & HS Opyd, Lavone Neri, MD   1 g at 10/19/23 1111    Allergies as of 10/14/2023 - Review Complete 10/14/2023  Allergen Reaction Noted   Codeine Nausea Only 10/19/2008     Review of Systems:    Constitutional: No weight loss, fever or chills Skin: No rash  Cardiovascular: No chest pain   Respiratory: No SOB  Gastrointestinal: See HPI and otherwise negative Genitourinary: No dysuria Neurological: No headache, dizziness or syncope Musculoskeletal: No new muscle or joint pain Hematologic: No bruising Psychiatric: No history of depression or anxiety     Physical Exam:  Vital signs in last 24 hours: Temp:  [97.8 F (36.6 C)-99.6 F (37.6 C)] 99.6 F (37.6 C) (12/02 1401) Pulse Rate:  [65-72] 72 (12/02 1401) Resp:  [14-18] 14 (12/02 1401) BP: (139-156)/(45-52) 148/48 (12/02 1401) SpO2:  [96 %-97 %] 97 % (12/02 1401) Last BM Date : 10/18/23 General:   Pleasant Elderly Caucasian female appears to be in NAD, Well developed,  Well nourished, asleep Head:  Normocephalic and atraumatic. Eyes:   PEERL, EOMI. No icterus. Conjunctiva pink. Ears:  Normal auditory acuity. Neck:  Supple Throat: Oral cavity and pharynx without inflammation, swelling or lesion. Teeth in good condition. Lungs: Respirations even and unlabored. Lungs clear to auscultation bilaterally.   No wheezes, crackles, or rhonchi.  Heart: Normal S1, S2. No MRG. Regular rate and rhythm. No peripheral edema, cyanosis or pallor.  Abdomen:  Soft, nondistended, nontender. No rebound or guarding. Normal bowel sounds. No appreciable masses or hepatomegaly. +ostomy with semisolid brown stool Rectal:  Not performed.  Msk:  Symmetrical without gross deformities. Peripheral pulses intact.  Extremities:  Without edema, no deformity or joint abnormality. Normal ROM, normal sensation. Neurologic:  Asleep, wakes with palpation of the abdomen Skin:   Dry and intact without significant lesions or rashes. Psychiatric: Asleep/lethargic at time of exam   LAB RESULTS: Recent Labs    10/17/23 0302 10/18/23 0809 10/19/23 0323  WBC 8.4 7.9 7.6  HGB 8.0* 8.2* 7.8*  HCT 25.7* 26.4* 26.6*  PLT 171 173 187   BMET Recent Labs    10/17/23 0302 10/19/23 0323  NA 134* 135  K 3.5 3.0*  CL 102 102  CO2 23 24  GLUCOSE 210* 281*  BUN 24* 18  CREATININE 0.85 0.84  CALCIUM 7.8* 7.9*   LFT Recent Labs    10/19/23 0323  PROT 5.1*  ALBUMIN 1.9*  AST 19  ALT 13  ALKPHOS 89  BILITOT 0.2     Impression / Plan:   Impression: 1.  History of GI bleed with acute on chronic anemia: On Plavix, EGD in October 2024 with findings of erosive gastritis, duodenal ulcers and duodenal stricture, repeat enteroscopy on 11/26 with 1 nonbleeding AVM in the duodenum and gastritis, Plavix restarted 11/29, hemoglobin 10.9 on 11/27--> 10.6--> 9.2--> 8.0--> 8.2--> 7.8 2.  Epigastric pain/vomiting/hematemesis: Abnormal HIDA scan with question of chronic acalculous cholecystitis,  recommended consulting general surgery 3.  History of rectal adenocarcinoma status post colectomy with colostomy: CT scan this admission showing suggestion of a 5.4 x 3.4 cm irregular annular rectal mass, rectal tumor not excluded, patient has declined any further treatment due to intolerance of chemotherapy 4.  UTI: On presentation, currently  on Merrem as of 11/30 5.  CAD: On Plavix, currently aspirin and Plavix on hold due to GI bleed 6.  Heart failure preserved EF: Echo 11/24 with LVEF 60-65% grade 1 diastolic dysfunction 7.  Dementia 8.  Hypokalemia  Plan: Discussed case with Dr. Marina Goodell.  Patient had recent enteroscopy and has had previous GI workup with known rectal cancer and ostomy in place.  No overt signs of GI bleeding.  This is acute on chronic anemia, would recommend transfusion of 1 unit PRBCs.  This can then be followed outpatient by her hematologist/oncologist Dr. Myna Hidalgo or her PCP.  The only time we would pursue a GI workup in the future is with overt signs of GI bleeding. Family is in agreement and do not want "unnecessary procedures". Continue Pantoprazole 40 mg BID and Carafate 1 G QID x 3 more weeks. Okay with patient discharge.  We will sign off.  Please let us know if you have any further questions.  Thank you for your kind consultation, we will continue to follow.  [name]  10/19/2023, 2:06 PM  GI ATTENDING  History, laboratories, x-rays, multiple endoscopy reports reviewed.  Agree with comprehensive consultation note as outlined above, including history and physical examination.  We are asked to see the patient for drifting hemoglobin.  No acute GI bleeding.  She has chronic anemia.  Admitted with symptomatic UTI.  She has untreated rectal cancer.  Ostomy contents unremarkable.  Extensive upper endoscopy with enteroscopy less than 1 week ago.  No significant abnormalities. At this point, we simply recommend transfusing to clinically desired hemoglobin,  continuing PPI therapy, and treating other medical problems.  I expect that she will continue to have issues with anemia given her multiple medical problems and untreated cancer.  You may resume her Plavix.  There are no plans for any endoscopic evaluations, short of clinically significant/overt GI bleeding.  Thank you.  Will sign off.  Wilhemina Bonito. Eda Keys., M.D. [facility] Division of Gastroenterology

## 2023-10-19 NOTE — Progress Notes (Signed)
Arrived to room, Dahlia Client, RN requested I wait to de-access port. She will re-enter consult when ready.

## 2023-10-19 NOTE — Progress Notes (Addendum)
PROGRESS NOTE    [name]  YQI:[id number] DOB: [birth year] DOA: 10/14/2023 PCP: Lewis Moccasin, MD  Chief Complaint  Patient presents with   Weakness    Hospital Course:  [name] is 77 y.o. female with rectal adenocarcinoma status post colectomy, recurrent cervical adenocarcinoma, history of CVA, CAD, type 2 diabetes, hypothyroidism, chronic pain, chronic anemia, and recent admission for abdominal pain and GI bleed discharged on 11/27.  She reports she was feeling well at time of discharge but when she returned home began experiencing nausea, vomiting, suprapubic pain.  She Leander Rams presented to the ED.  On arrival she was found to be febrile with a leukocytosis of 13.  Blood and urine cultures were collected and patient was admitted for further workup.  She was found to have a UTI.  She was started on antibiotics.  Her stay was then prolonged and complicated by significant delirium and drowsiness.  Patient's antibiotics were switched to Merrem on 11/30 and she had significant improvement in her mentation.  Blood cultures remain negative.  On 12/2 we were preparing patient for discharge, on evaluation patient appears more drowsy and hemoglobin reveals 7.8 which is a significant 3 point downtrend since arrival.  Plavix was already stopped.  GI was reconsulted.  At this time family does not want any further procedures and GI is recommending to continue current medications.  Have ordered 1 unit PRBC.    Subjective: No events overnight.  Patient is drowsy but arousable this morning.  She is alert and oriented.   Objective: Vitals:   10/18/23 1335 10/18/23 2144 10/19/23 0609 10/19/23 1401  BP: (!) 137/59 (!) 156/52 (!) 139/45 (!) 148/48  Pulse: 78 68 65 72  Resp: 15 18 18 14   Temp: 99.1 F (37.3 C) 97.8 F (36.6 C) 98.5 F (36.9 C) 99.6 F (37.6 C)  TempSrc: Oral   Oral  SpO2: 98% 97% 96% 97%  Weight:      Height:        Intake/Output Summary (Last 24 hours) at 10/19/2023  1455 Last data filed at 10/19/2023 1333 Gross per 24 hour  Intake 1160.06 ml  Output 200 ml  Net 960.06 ml   Filed Weights   10/15/23 0115 10/17/23 0500 10/18/23 0500  Weight: 68.3 kg 65.9 kg 68.3 kg    Examination: General exam: Calm, comfortable, no acute distress. Respiratory system: No work of breathing, symmetric chest wall expansion Cardiovascular system: S1 & S2 heard, RRR.  Gastrointestinal system: Ostomy noted.  No melena.  No acute bleeding.  Abdomen is nondistended Neuro: drowsy but easily arousable. No focal neurological deficits. Extremities: Symmetric, expected ROM, no pedal edema. Skin: No rashes, lesions Psychiatry: calm, affect and mood appropriate   Assessment & Plan:  Principal Problem:   Acute UTI Active Problems:   History of CVA (cerebrovascular accident)   Rectal cancer (HCC)   Hypothyroidism   DMII (diabetes mellitus, type 2) (HCC)   Essential hypertension   Coronary artery disease   Chronic diastolic CHF (congestive heart failure) (HCC)   Upper GI bleed   PUD (peptic ulcer disease)   Multiple drug resistant organism (MDRO) culture positive  UTI - Presents with fever leukocytosis and suprapubic pain.  UA compatible with infection.  -Cultures now positive for ESBL Klebsiella and Proteus, both sensitive to Merrem.  Has been on cefepime.  Switched to [facility] 11/30. -Infectious disease consulted on 12/2 to help for arrangement of outpatient IV antibiotics.  Recommends discontinuing IV antibiotics and proceed with  fosfomycin only. -Blood cultures remain negative - Currently voiding freely, bladder scan less than 200 cc.  Acute metabolic encephalopathy - Now back to baseline -- Some degree of underlying dementia - Suspect that this altered mental status is due to UTI.   Recent GI bleed Peptic ulcer disease Erosive gastritis Schatzki ring Duodenal stenosis Rectal Mass -Status post EGD on 11/26, had extensive workup with GI on prior admission - No  active bleeding or melena appreciated at this time.  Hemoglobin has been gradually downtrending, nadir of 7.8 today.  This is a 3 point decline from arrival, have reconsulted GI reports no further intervention at this time - Plavix has already been discontinued during this admission, discussed extensively with the patient's daughter who is amenable to discontinuation of this medication. - Given that her hemoglobin continues to downtrend and she is currently symptomatic with a history of CAD we will go ahead and transfuse her 1 unit PRBC today.  Repeat hemoglobin in the morning.. - Continue PPI and Carafate  CAD - Appears stable with no anginal complaints - Continue statin only for now.  Hold aspirin and Plavix due to GI bleed as above.  Type 2 diabetes, with hyperglycemia - Hemoglobin A1c 5.7% - Continue sliding scale insulin titrate up as tolerated  History of CVA - Stable.  Currently deconditioned worse than baseline.  Continue statin -- PT/OT  Hypothyroidism - Continue home dose Synthroid  Chronic pain - Continue home regimen for now  History of rectal and cervical cancer - Not currently undergoing active treatment - Follow-up with outpatient heme onco  BMI 29 - Outpatient follow up for lifestyle modification and risk factor management  Depression - Continue home dose Zoloft  Hyperlipidemia - Continue statin  Chronic anemia - Hemoglobin baseline appears to be 9-10 - Appears to be downtrending.  Hold Plavix as above  Hyponatremia 130 on arrival, resolved now. - Continue to follow CMP  Heart failure with preserved EF Echocardiogram November 2024: LVEF 60 to 65%, grade 1 diastolic dysfunction Elevated blood pressure without history of hypertension - BP may be elevated in setting of acute pain. - Follow closely - Initiate amlodipine if continuing to uptrend. Currently DBP ~50s, unlikely to tolerate antihypertensive - PRN Lasix  Dementia -- Some issues with acute  delirium this admission -- Cont close monitoring and frequent reorientation -Patient is on multiple sedating medications including gabapentin, oxycodone, baclofen.  I have discussed this with her daughter.  Patient is resistant to discontinuation or taper of this as they have been long-term meds for her.  Will slowly decrease dose of baclofen and monitor for improvement in drowsiness  Generalized deconditioning - Patient's daughter reports patient will not consider rehab at discharge.   -Home health PT/OT ordered.  Family and patient resistant to rehab/SNF though it was offered to them   DVT prophylaxis: No Ac given bleed, SCDs. Code Status: Full Family Communication: Extensively discussed her care with patient's daughter Selena Batten at bedside. Disposition: Very drowsy today.  Now receiving 1 unit PRBC.  Will plan for discharge in the morning  Consultants:  none  Procedures:  none  Antimicrobials:  Anti-infectives (From admission, onward)    Start     Dose/Rate Route Frequency Ordered Stop   10/19/23 0000  fosfomycin (MONUROL) 3 g PACK        3 g Oral Daily 10/19/23 1322 10/21/23 2359   10/17/23 1100  meropenem (MERREM) 1 g in sodium chloride 0.9 % 100 mL IVPB  1 g 200 mL/hr over 30 Minutes Intravenous Every 12 hours 10/17/23 1012     10/15/23 0600  ceFEPIme (MAXIPIME) 2 g in sodium chloride 0.9 % 100 mL IVPB  Status:  Discontinued        2 g 200 mL/hr over 30 Minutes Intravenous Every 12 hours 10/14/23 2352 10/17/23 1012   10/14/23 2130  cefTRIAXone (ROCEPHIN) 1 g in sodium chloride 0.9 % 100 mL IVPB        1 g 200 mL/hr over 30 Minutes Intravenous  Once 10/14/23 2128 10/14/23 2233       Data Reviewed: I have personally reviewed following labs and imaging studies CBC: Recent Labs  Lab 10/14/23 2025 10/15/23 0509 10/16/23 0307 10/17/23 0302 10/18/23 0809 10/19/23 0323  WBC 13.0* 13.4* 10.3 8.4 7.9 7.6  NEUTROABS 12.0*  --   --   --  6.5 5.9  HGB 10.9* 10.6* 9.2* 8.0*  8.2* 7.8*  HCT 35.6* 34.8* 29.8* 25.7* 26.4* 26.6*  MCV 90.6 91.3 89.0 89.2 88.3 92.0  PLT 223 217 181 171 173 187   Basic Metabolic Panel: Recent Labs  Lab 10/14/23 2025 10/15/23 0509 10/16/23 0307 10/17/23 0302 10/19/23 0323  NA 130* 129* 130* 134* 135  K 4.5 4.3 3.8 3.5 3.0*  CL 100 98 101 102 102  CO2 18* 20* 20* 23 24  GLUCOSE 246* 147* 153* 210* 281*  BUN 33* 33* 30* 24* 18  CREATININE 1.01* 0.95 0.95 0.85 0.84  CALCIUM 8.3* 8.1* 7.9* 7.8* 7.9*  MG  --   --   --   --  1.4*  PHOS  --   --   --   --  1.2*   GFR: Estimated Creatinine Clearance: 48.3 mL/min (by C-G formula based on SCr of 0.84 mg/dL). Liver Function Tests: Recent Labs  Lab 10/14/23 0426 10/14/23 2025 10/19/23 0323  AST 13* 14* 19  ALT 9 8 13   ALKPHOS 71 83 89  BILITOT 0.7 1.3* 0.2  PROT 5.6* 6.1* 5.1*  ALBUMIN 2.5* 2.9* 1.9*   CBG: Recent Labs  Lab 10/18/23 1147 10/18/23 1643 10/18/23 2139 10/19/23 0719 10/19/23 1103  GLUCAP 213* 213* 179* 220* 249*    Recent Results (from the past 240 hour(s))  Culture, blood (Routine x 2)     Status: None   Collection Time: 10/14/23  8:25 PM   Specimen: BLOOD  Result Value Ref Range Status   Specimen Description   Final    BLOOD RIGHT ANTECUBITAL Performed at [facility], [address] [place], [place] [address]    Special Requests   Final    BOTTLES DRAWN AEROBIC AND ANAEROBIC Blood Culture results may not be optimal due to an inadequate volume of blood received in culture bottles Performed at [facility], [address] [place], [place] [address]    Culture   Final    NO GROWTH 5 DAYS Performed at [facility], [address], [place], [place] [address]    Report Status 10/19/2023 FINAL  Final  Resp panel by RT-PCR (RSV, Flu A&B, Covid) Anterior Nasal Swab     Status: None   Collection Time: 10/14/23  8:52 PM   Specimen: Anterior Nasal Swab  Result Value Ref Range Status   SARS Coronavirus 2  by RT PCR NEGATIVE NEGATIVE Final    Comment: (NOTE) SARS-CoV-2 target nucleic acids are NOT DETECTED.  The SARS-CoV-2 RNA is generally detectable in upper respiratory specimens during the acute phase  of infection. The lowest concentration of SARS-CoV-2 viral copies this assay can detect is 138 copies/mL. A negative result does not preclude SARS-Cov-2 infection and should not be used as the sole basis for treatment or other patient management decisions. A negative result may occur with  improper specimen collection/handling, submission of specimen other than nasopharyngeal swab, presence of viral mutation(s) within the areas targeted by this assay, and inadequate number of viral copies(<138 copies/mL). A negative result must be combined with clinical observations, patient history, and epidemiological information. The expected result is Negative.  Fact Sheet for Patients:  BloggerCourse.com  Fact Sheet for Healthcare Providers:  SeriousBroker.it  This test is no t yet approved or cleared by the [place] FDA and  has been authorized for detection and/or diagnosis of SARS-CoV-2 by FDA under an Emergency Use Authorization (EUA). This EUA will remain  in effect (meaning this test can be used) for the duration of the COVID-19 declaration under Section 564(b)(1) of the Act, 21 U.S.C.section 360bbb-3(b)(1), unless the authorization is terminated  or revoked sooner.       Influenza A by PCR NEGATIVE NEGATIVE Final   Influenza B by PCR NEGATIVE NEGATIVE Final    Comment: (NOTE) The Xpert Xpress SARS-CoV-2/FLU/RSV plus assay is intended as an aid in the diagnosis of influenza from Nasopharyngeal swab specimens and should not be used as a sole basis for treatment. Nasal washings and aspirates are unacceptable for Xpert Xpress SARS-CoV-2/FLU/RSV testing.  Fact Sheet for Patients: BloggerCourse.com  Fact  Sheet for Healthcare Providers: SeriousBroker.it  This test is not yet approved or cleared by the [place] FDA and has been authorized for detection and/or diagnosis of SARS-CoV-2 by FDA under an Emergency Use Authorization (EUA). This EUA will remain in effect (meaning this test can be used) for the duration of the COVID-19 declaration under Section 564(b)(1) of the Act, 21 U.S.C. section 360bbb-3(b)(1), unless the authorization is terminated or revoked.     Resp Syncytial Virus by PCR NEGATIVE NEGATIVE Final    Comment: (NOTE) Fact Sheet for Patients: BloggerCourse.com  Fact Sheet for Healthcare Providers: SeriousBroker.it  This test is not yet approved or cleared by the [place] FDA and has been authorized for detection and/or diagnosis of SARS-CoV-2 by FDA under an Emergency Use Authorization (EUA). This EUA will remain in effect (meaning this test can be used) for the duration of the COVID-19 declaration under Section 564(b)(1) of the Act, 21 U.S.C. section 360bbb-3(b)(1), unless the authorization is terminated or revoked.  Performed at [facility], [address] [place], [place] [address]   Urine Culture     Status: Abnormal   Collection Time: 10/14/23 10:04 PM   Specimen: Urine, Clean Catch  Result Value Ref Range Status   Specimen Description   Final    URINE, CLEAN CATCH Performed at [facility], [address], [place], [place] [address]    Special Requests   Final    NONE Performed at [facility], [address], [place], [place] [address]    Culture (A)  Final    >=100,000 COLONIES/mL KLEBSIELLA PNEUMONIAE 10,000 COLONIES/mL PROTEUS MIRABILIS Confirmed Extended Spectrum Beta-Lactamase Producer (ESBL).  In bloodstream infections from ESBL organisms, carbapenems are preferred over piperacillin/tazobactam. They are  shown to have a lower risk of mortality. KLEBSIELLA PNEUMONIAE    Report Status 10/17/2023 FINAL  Final   Organism ID, Bacteria KLEBSIELLA PNEUMONIAE (A)  Final   Organism ID, Bacteria PROTEUS MIRABILIS (A)  Final      Susceptibility   Klebsiella pneumoniae - MIC*    AMPICILLIN >=32 RESISTANT Resistant     CEFAZOLIN >=64 RESISTANT Resistant     CEFEPIME >=32 RESISTANT Resistant     CEFTRIAXONE >=64 RESISTANT Resistant     CIPROFLOXACIN 1 RESISTANT Resistant     GENTAMICIN >=16 RESISTANT Resistant     IMIPENEM <=0.25 SENSITIVE Sensitive     NITROFURANTOIN 64 INTERMEDIATE Intermediate     TRIMETH/SULFA >=320 RESISTANT Resistant     AMPICILLIN/SULBACTAM 16 INTERMEDIATE Intermediate     PIP/TAZO <=4 SENSITIVE Sensitive ug/mL    * >=100,000 COLONIES/mL KLEBSIELLA PNEUMONIAE   Proteus mirabilis - MIC*    AMPICILLIN <=2 SENSITIVE Sensitive     CEFAZOLIN 8 SENSITIVE Sensitive     CEFEPIME <=0.12 SENSITIVE Sensitive     CEFTRIAXONE <=0.25 SENSITIVE Sensitive     CIPROFLOXACIN <=0.25 SENSITIVE Sensitive     GENTAMICIN <=1 SENSITIVE Sensitive     IMIPENEM 2 SENSITIVE Sensitive     NITROFURANTOIN 128 RESISTANT Resistant     TRIMETH/SULFA <=20 SENSITIVE Sensitive     AMPICILLIN/SULBACTAM <=2 SENSITIVE Sensitive     PIP/TAZO <=4 SENSITIVE Sensitive ug/mL    * 10,000 COLONIES/mL PROTEUS MIRABILIS  Culture, blood (Routine x 2)     Status: None (Preliminary result)   Collection Time: 10/15/23  5:19 AM   Specimen: BLOOD RIGHT HAND  Result Value Ref Range Status   Specimen Description   Final    BLOOD RIGHT HAND Performed at [facility], [address] [place], [place] [address]    Special Requests   Final    BOTTLES DRAWN AEROBIC ONLY Blood Culture results may not be optimal due to an inadequate volume of blood received in culture bottles Performed at [facility], [address] [place], [place] [address]    Culture   Final    NO GROWTH 4 DAYS Performed at  [facility], [address] [place], [place] [address]    Report Status PENDING  Incomplete     Radiology Studies: No results found.  Scheduled Meds:  sodium chloride   Intravenous Once   atorvastatin  40 mg Oral QHS   baclofen  20 mg Oral BID   Chlorhexidine Gluconate Cloth  6 each Topical Daily   gabapentin  300 mg Oral Daily   gabapentin  600 mg Oral QHS   insulin aspart  0-5 Units Subcutaneous QHS   insulin aspart  0-6 Units Subcutaneous TID WC   levothyroxine  137 mcg Oral Q0600   oxyCODONE  10 mg Oral Q12H   pantoprazole  40 mg Oral BID AC   Followed by   Melene Muller ON 12/23/2023] pantoprazole  40 mg Oral Daily   sertraline  50 mg Oral QHS   sodium chloride flush  10-40 mL Intracatheter Q12H   sucralfate  1 g Oral TID WC & HS   Continuous Infusions:  meropenem (MERREM) IV 1 g (10/19/23 0950)   potassium PHOSPHATE IVPB (in mmol) 45 mmol (10/19/23 0817)     LOS: 5 days    Time spent:   Debarah Crape, DO [facility]  To contact the attending physician between 7A-7P please use Epic Chat. To contact the covering physician during after hours 7P-7A, please review Amion.   10/19/2023, 2:55 PM

## 2023-10-19 NOTE — TOC CM/SW Note (Signed)
 Transition of Care [facility]) - Inpatient Brief Assessment   Patient Details  Name: [name] MRN: [id number] Date of Birth: 1946-03-05  Transition of Care [facility]) CM/SW Contact:    Darleene Cleaver, LCSW Phone Number: 10/15/2023, 9:51 AM   Clinical Narrative:  Patient triggered an SDOH for housing.  After reviewing chart patient lives with her spouse, and her daughter stays with her as well.  Patient has insurance and also has a PCP.  Patient does not have any other TOC needs anticipated at this time.  Transition of Care Asessment: Insurance and Status: Insurance coverage has been reviewed Patient has primary care physician: Yes Home environment has been reviewed: Yes Prior level of function:: Indep Prior/Current Home Services: No current home services Social Determinants of Health Reivew: SDOH reviewed interventions complete Readmission risk has been reviewed: Yes Transition of care needs: no transition of care needs at this time

## 2023-10-19 NOTE — Plan of Care (Signed)
  Problem: Metabolic: Goal: Ability to maintain appropriate glucose levels will improve Outcome: Progressing   Problem: Nutritional: Goal: Maintenance of adequate nutrition will improve Outcome: Progressing   

## 2023-10-20 LAB — COMPREHENSIVE METABOLIC PANEL
ALT: 17 U/L (ref 0–44)
AST: 25 U/L (ref 15–41)
Albumin: 2 g/dL — ABNORMAL LOW (ref 3.5–5.0)
Alkaline Phosphatase: 101 U/L (ref 38–126)
Anion gap: 4 — ABNORMAL LOW (ref 5–15)
BUN: 14 mg/dL (ref 8–23)
CO2: 26 mmol/L (ref 22–32)
Calcium: 7.7 mg/dL — ABNORMAL LOW (ref 8.9–10.3)
Chloride: 102 mmol/L (ref 98–111)
Creatinine, Ser: 0.73 mg/dL (ref 0.44–1.00)
GFR, Estimated: >60 mL/min (ref >60)
Glucose, Bld: 339 mg/dL — ABNORMAL HIGH (ref 70–99)
Potassium: 3.4 mmol/L — ABNORMAL LOW (ref 3.5–5.1)
Sodium: 132 mmol/L — ABNORMAL LOW (ref 135–145)
Total Bilirubin: 0.4 mg/dL (ref <1.2)
Total Protein: 5.5 g/dL — ABNORMAL LOW (ref 6.5–8.1)

## 2023-10-20 LAB — MAGNESIUM: Magnesium: 1.9 mg/dL (ref 1.7–2.4)

## 2023-10-20 LAB — CBC WITH DIFFERENTIAL/PLATELET
Abs Immature Granulocytes: 0.12 10*3/uL — ABNORMAL HIGH (ref 0.00–0.07)
Basophils Absolute: 0 10*3/uL (ref 0.0–0.1)
Basophils Relative: 0 %
Eosinophils Absolute: 0.1 10*3/uL (ref 0.0–0.5)
Eosinophils Relative: 1 %
HCT: 31.2 % — ABNORMAL LOW (ref 36.0–46.0)
Hemoglobin: 10 g/dL — ABNORMAL LOW (ref 12.0–15.0)
Immature Granulocytes: 2 %
Lymphocytes Relative: 10 %
Lymphs Abs: 0.8 10*3/uL (ref 0.7–4.0)
MCH: 28.1 pg (ref 26.0–34.0)
MCHC: 32.1 g/dL (ref 30.0–36.0)
MCV: 87.6 fL (ref 80.0–100.0)
Monocytes Absolute: 0.6 10*3/uL (ref 0.1–1.0)
Monocytes Relative: 7 %
Neutro Abs: 6.6 10*3/uL (ref 1.7–7.7)
Neutrophils Relative %: 80 %
Platelets: 204 10*3/uL (ref 150–400)
RBC: 3.56 MIL/uL — ABNORMAL LOW (ref 3.87–5.11)
RDW: 14.7 % (ref 11.5–15.5)
WBC: 8.1 10*3/uL (ref 4.0–10.5)
nRBC: 0 % (ref 0.0–0.2)

## 2023-10-20 LAB — TYPE AND SCREEN
ABO/RH(D): O POS
Antibody Screen: NEGATIVE
Unit division: 0

## 2023-10-20 LAB — BPAM RBC
Blood Product Expiration Date: 202412272359
ISSUE DATE / TIME: 202412021839
Unit Type and Rh: 5100

## 2023-10-20 LAB — CULTURE, BLOOD (ROUTINE X 2): Culture: NO GROWTH

## 2023-10-20 LAB — GLUCOSE, CAPILLARY
Glucose-Capillary: 252 mg/dL — ABNORMAL HIGH (ref 70–99)
Glucose-Capillary: 201 mg/dL — ABNORMAL HIGH (ref 70–99)

## 2023-10-20 LAB — PHOSPHORUS: Phosphorus: 2.2 mg/dL — ABNORMAL LOW (ref 2.5–4.6)

## 2023-10-20 MED ORDER — HEPARIN SOD (PORK) LOCK FLUSH 100 UNIT/ML IV SOLN
500.0000 [IU] | INTRAVENOUS | Status: AC | PRN
  Administered 2023-10-20: 500 [IU]
  Filled 2023-10-20: qty 5

## 2023-10-20 NOTE — Hospital Course (Signed)
[name] is 77 y.o. female with rectal adenocarcinoma status post colectomy with likely recurrence as there was recently seen rectal mass, recurrent cervical adenocarcinoma, history of CVA, CAD, type 2 diabetes, hypothyroidism, chronic pain, chronic anemia, and recent admission for abdominal pain and GI bleed discharged on 11/27.  She reports when she returned home she began experiencing nausea, vomiting, suprapubic pain.  She presented to the ED 11/27.  On arrival she was found to be febrile with a leukocytosis of 13.  Blood and urine cultures were collected and patient was admitted for further workup.  She was found to have a UTI.  She was started on antibiotics.  Her stay was then prolonged and complicated by significant delirium and drowsiness.  Patient's antibiotics were switched to Merrem on 11/30 and she had significant improvement in her mentation.  ID was consulted to help arrange outpatient IV antibiotics however it was believed that her infection may not truly be ESBL and it was recommended she continue with fosfomycin for two days.  This was called directly into the pharmacy.  Blood cultures remained negative.  On 12/2 we were preparing patient for discharge, but patient became significantly more drowsy and hemoglobin revealed 7.8 which is a significant 3 point downtrending since arrival.  Plavix had already been stopped this admission.  No occult bleeding seen.  GI was reconsulted.  At this time family does not want any further procedures and GI is recommending to continue current medications, and transfuse.  Patient received 1 unit PRBC.  On reevaluation on 12/3 patient was significantly improved.  She reported feeling back to her baseline.   Patient is significantly deconditioned, and required significant assistance with transfers.  Physical therapy and Occupational Therapy recommended SNF or rehab at discharge.  We had extensive discussions with the patient's daughter, [name], as well as the  patient herself both of whom refused SNF and would prefer to go home with home health.  Home health PT/OT/RN/HHA/palliative care were all consulted and arranged prior to discharge.  Given the patient's extra time in the hospital she completed a course of IV Merrem for treatment of her UTI, fosfomycin was canceled.

## 2023-10-20 NOTE — TOC Transition Note (Addendum)
Transition of Care [facility]) - CM/SW Discharge Note   Patient Details  Name: [name] MRN: [id number] Date of Birth: [birth year]  Transition of Care [facility]) CM/SW Contact:  Darleene Cleaver, LCSW Phone Number: 10/20/2023, 11:30 AM   Clinical Narrative:     Patient will be going home with home health through [facility].  TOC signing off please reconsult with any other TOC needs, home health agency has been notified of planned discharge.    Palliative to follow outpatient, daughter in agreement to it, referral made to [facility].  Patient's daughter is requesting EMS transport home due to patient's weakness.  CSW contacted EMS per daughter she would like a 1pm pickup.    CSW contacted EMS at 11:15am, [phone], attending physician and bedside nurse made aware.    Final next level of care: Home w Home Health Services Barriers to Discharge: Barriers Resolved   Patient Goals and CMS Choice CMS Medicare.gov Compare Post Acute Care list provided to:: Patient Represenative (must comment) Choice offered to / list presented to : Adult Children  Discharge Placement  Home with HH PT, OT, Aide, and Child psychotherapist.  No DME needs per daughter.               Discharge Plan and Services Additional resources added to the After Visit Summary for                            [facility] Arranged: PT, OT, Nurse's Aide, Social Work, Refused SNF, RN HH Agency: [facility] Date [facility] Agency Contacted: 10/19/23 Time HH Agency Contacted: 1327 Representative spoke with at [facility] Agency: Tresa Endo  Social Determinants of Health (SDOH) Interventions SDOH Screenings   Food Insecurity: Patient Unable To Answer (10/14/2023)  Housing: Low Risk  (10/15/2023)  Recent Concern: Housing - High Risk (10/14/2023)  Transportation Needs: Patient Unable To Answer (10/14/2023)  Utilities: Patient Unable To Answer (10/14/2023)  Depression (PHQ2-9): Low Risk  (01/17/2020)  Tobacco Use: Medium Risk  (10/14/2023)     Readmission Risk Interventions    10/15/2023    9:49 AM 09/11/2023   11:36 AM 12/02/2021    1:55 PM  Readmission Risk Prevention Plan  Transportation Screening Complete Complete Complete  PCP or Specialist Appt within 3-5 Days  Complete   HRI or Home Care Consult  Complete   Social Work Consult for Recovery Care Planning/Counseling  Complete   Palliative Care Screening  Not Applicable   Medication Review [facility]) Referral to Pharmacy Complete Complete  PCP or Specialist appointment within 3-5 days of discharge Complete  Complete  HRI or Home Care Consult Complete  Complete  SW Recovery Care/Counseling Consult Complete  Complete  Palliative Care Screening Not Applicable  Not Applicable  Skilled Nursing Facility Not Applicable  Patient Refused

## 2023-10-20 NOTE — Plan of Care (Signed)
  Problem: Fluid Volume: Goal: Ability to maintain a balanced intake and output will improve Outcome: Progressing   

## 2023-10-20 NOTE — Progress Notes (Signed)
[facility] [address] Liaison Note:  Notified by [facility] manager of patient/family request for [facility] Palliative services at home after discharge.   Please call with any hospice or outpatient palliative care related questions.   Thank you for the opportunity to participate in this patient's care.   Glenna Fellows, BSN, RN, OCN [employer] [phone]

## 2023-10-20 NOTE — Care Management Important Message (Signed)
Important Message  Patient Details IM Letter given. Name: [name] MRN: [id number] Date of Birth: April 17, 1946   Important Message Given:  Yes - Medicare IM     Caren Macadam 10/20/2023, 11:17 AM

## 2023-10-20 NOTE — Discharge Summary (Signed)
Physician Discharge Summary   Patient: [name] MRN: [id number] DOB: May 12, 1946  Admit date:     10/14/2023  Discharge date: 10/20/23  Discharge Physician: Debarah Crape   PCP: Lewis Moccasin, MD   Recommendations at discharge:   Primary care physician Follow-up with your gastroenterologist Follow-up with your oncologist  Discharge Diagnoses: Principal Problem:   Acute UTI Active Problems:   History of CVA (cerebrovascular accident)   Rectal cancer (HCC)   Hypothyroidism   DMII (diabetes mellitus, type 2) (HCC)   Essential hypertension   Coronary artery disease   Chronic diastolic CHF (congestive heart failure) (HCC)   Upper GI bleed   PUD (peptic ulcer disease)   Multiple drug resistant organism (MDRO) culture positive  Resolved Problems:   * No resolved hospital problems. *  Hospital Course: [name] is 77 y.o. female with rectal adenocarcinoma status post colectomy with likely recurrence as there was recently seen rectal mass, recurrent cervical adenocarcinoma, history of CVA, CAD, type 2 diabetes, hypothyroidism, chronic pain, chronic anemia, and recent admission for abdominal pain and GI bleed discharged on 11/27.  She reports when she returned home she began experiencing nausea, vomiting, suprapubic pain.  She presented to the ED 11/27.  On arrival she was found to be febrile with a leukocytosis of 13.  Blood and urine cultures were collected and patient was admitted for further workup.  She was found to have a UTI.  She was started on antibiotics.  Her stay was then prolonged and complicated by significant delirium and drowsiness.  Patient's antibiotics were switched to Merrem on 11/30 and she had significant improvement in her mentation.  ID was consulted to help arrange outpatient IV antibiotics however it was believed that her infection may not truly be ESBL and it was recommended she continue with fosfomycin for two days.  This was called directly  into the pharmacy.  Blood cultures remained negative.  On 12/2 we were preparing patient for discharge, but patient became significantly more drowsy and hemoglobin revealed 7.8 which is a significant 3 point downtrending since arrival.  Plavix had already been stopped this admission.  No occult bleeding seen.  GI was reconsulted.  At this time family does not want any further procedures and GI is recommending to continue current medications, and transfuse.  Patient received 1 unit PRBC.  On reevaluation on 12/3 patient was significantly improved.  She reported feeling back to her baseline.   Patient is significantly deconditioned, and required significant assistance with transfers.  Physical therapy and Occupational Therapy recommended SNF or rehab at discharge.  We had extensive discussions with the patient's daughter, Selena Batten, as well as the patient herself both of whom refused SNF and would prefer to go home with home health.  Home health PT/OT/RN/HHA/palliative care were all consulted and arranged prior to discharge.  Given the patient's extra time in the hospital she completed a course of IV Merrem for treatment of her UTI, fosfomycin was canceled.      Consultants: GI, Infectious disease Procedures performed: none  Disposition: Home health Diet recommendation:  Discharge Diet Orders (From admission, onward)     Start     Ordered   10/20/23 0000  Diet - low sodium heart healthy        10/20/23 1105   10/19/23 0000  Diet - low sodium heart healthy        10/19/23 1324           Cardiac and Carb modified  diet DISCHARGE MEDICATION: Allergies as of 10/20/2023       Reactions   Codeine Nausea Only        Medication List     STOP taking these medications    ascorbic acid 500 MG tablet Commonly known as: VITAMIN C   clopidogrel 75 MG tablet Commonly known as: PLAVIX   gemfibrozil 600 MG tablet Commonly known as: LOPID   MAGnesium-Oxide 400 (240 Mg) MG tablet Generic drug:  magnesium oxide       TAKE these medications    acetaminophen 500 MG tablet Commonly known as: TYLENOL Take 500 mg by mouth daily as needed for moderate pain (pain score 4-6) or headache.   atorvastatin 40 MG tablet Commonly known as: LIPITOR Take 40 mg by mouth at bedtime.   baclofen 10 MG tablet Commonly known as: LIORESAL Take 1 tablet (10 mg total) by mouth 2 (two) times daily. What changed: how much to take   gabapentin 300 MG capsule Commonly known as: NEURONTIN Take 300-600 mg by mouth See admin instructions. Take one capsule (300 mg) by mouth every morning and two capsules (600 mg) at night   Leg Cramps Tabs Take 1 tablet by mouth 2 (two) times daily as needed (leg pain).   levothyroxine 137 MCG tablet Commonly known as: SYNTHROID Take 137 mcg by mouth daily before breakfast.   MELATONIN PO Take 1 tablet by mouth at bedtime.   metFORMIN 500 MG tablet Commonly known as: GLUCOPHAGE Take 500 mg by mouth 2 (two) times daily with a meal.   nystatin powder Commonly known as: MYCOSTATIN/NYSTOP Apply 1 Application topically 2 (two) times daily. To groin rash What changed:  when to take this reasons to take this additional instructions   pantoprazole 40 MG tablet Commonly known as: PROTONIX Take 1 tablet (40 mg total) by mouth 2 (two) times daily before a meal for 70 days, THEN 1 tablet (40 mg total) daily. Start taking on: October 14, 2023   sertraline 50 MG tablet Commonly known as: ZOLOFT Take 50 mg by mouth at bedtime.   sucralfate 1 GM/10ML suspension Commonly known as: CARAFATE Take 10 mLs (1 g total) by mouth 4 (four) times daily -  with meals and at bedtime for 28 days.   Xtampza ER 9 MG C12a Generic drug: oxyCODONE ER Take 9 mg by mouth in the morning and at bedtime.        Follow-up Information     [facility] Follow [facility].   Specialty: Home Health Services Why: Home Health Physical Therapy/Occupational Therapy/Home Health  Aide Contact information: [address] [place] [address] [place] [address] [phone]                Discharge Exam: Ceasar Mons Weights   10/15/23 0115 10/17/23 0500 10/18/23 0500  Weight: 68.3 kg 65.9 kg 68.3 kg   General exam: Calm, comfortable, no acute distress. Respiratory system: No work of breathing, symmetric chest wall expansion Cardiovascular system: S1 & S2 heard, RRR.  Gastrointestinal system: Ostomy noted.  No melena.  No acute bleeding.  Abdomen is nondistended Neuro: drowsy but easily arousable. No focal neurological deficits. Extremities: Symmetric, expected ROM, no pedal edema. Skin: No rashes, lesions Psychiatry: calm, affect and mood appropriate   Condition at discharge: stable  The results of significant diagnostics from this hospitalization (including imaging, microbiology, ancillary and laboratory) are listed below for reference.   Imaging Studies: DG Chest Portable 1 View  Result Date: 10/14/2023 CLINICAL DATA:  Fever, cervical and rectal cancer EXAM: PORTABLE CHEST 1 VIEW COMPARISON:  Radiograph 09/10/2023 FINDINGS: Right chest wall Port-A-Cath tip in the right atrium. Stable cardiomediastinal silhouette. Aortic atherosclerotic calcification. No focal consolidation, pleural effusion, or pneumothorax. No displaced rib fractures. IMPRESSION: No active disease. Electronically Signed   By: Minerva Fester M.D.   On: 10/14/2023 21:15   US Abdomen Limited RUQ (LIVER/GB)  Result Date: 10/14/2023 CLINICAL DATA:  [id number] Abdominal pain [id number] EXAM: ULTRASOUND ABDOMEN LIMITED RIGHT UPPER QUADRANT COMPARISON:  HIDA scan 10/14/2023, CT 10/12/2023 FINDINGS: Gallbladder: Gallbladder is distended measuring 13 cm in length. No significant wall thickening. No sludge or stones within the gallbladder. No sonographic Murphy sign noted by sonographer. Common bile duct: Not visualized. Liver: No focal lesion identified. Within normal limits in parenchymal echogenicity. Portal vein  is patent on color Doppler imaging with normal direction of blood flow towards the liver. Other: None. Technical note: Sonographer describes a technically difficult study 2 2 poor penetration related to patient body habitus. Portions of the left hepatic lobe as well as the common bile duct were not able to be assessed. IMPRESSION: 1. Distended gallbladder without sonographic evidence of acute cholecystitis. 2. Nonvisualization of the common bile duct. Electronically Signed   By: Duanne Guess D.O.   On: 10/14/2023 16:02   NM Hepatobiliary Liver Func  Result Date: 10/14/2023 CLINICAL DATA:  Concern for acalculous cholecystitis. Biliary colic. EXAM: NUCLEAR MEDICINE HEPATOBILIARY IMAGING TECHNIQUE: Sequential images of the abdomen were obtained out to 60 minutes following intravenous administration of radiopharmaceutical. RADIOPHARMACEUTICALS:  5.5 mCi Tc-51m  Choletec IV COMPARISON:  CT 10/12/2019 FINDINGS: Prompt clearance radiotracer from blood pool and homogeneous uptake in liver. Counts are evident in the small bowel 25 minutes. Small focus of activity is noted in the region the neck of the gallbladder at 50 minutes. This activity continues to expand over 2 Hours of imaging. Favor delayed filling of the gallbladder. IMPRESSION: 1. Patent common bile duct. 2. Delayed filling of the gallbladder. Ejection fraction could not be performed as only partial filling of the gallbladder at 2 hours imaging. Electronically Signed   By: Genevive Bi M.D.   On: 10/14/2023 11:19   CT ABDOMEN PELVIS W CONTRAST  Result Date: 10/12/2023 CLINICAL DATA:  Inpatient. Abdominal pain, acute, nonlocalized. Rectal and cervical cancer. * Tracking Code: BO * EXAM: CT ABDOMEN AND PELVIS WITH CONTRAST TECHNIQUE: Multidetector CT imaging of the abdomen and pelvis was performed using the standard protocol following bolus administration of intravenous contrast. RADIATION DOSE REDUCTION: This exam was performed according to the  departmental dose-optimization program which includes automated exposure control, adjustment of the mA and/or kV according to patient size and/or use of iterative reconstruction technique. CONTRAST:  OMNIPAQUE IOHEXOL 300 MG/ML  SOLN COMPARISON:  01/21/2022 CT chest, abdomen and pelvis. 04/22/2023 CT pelvis. FINDINGS: Lower chest: Tip of superior approach central venous catheter is seen at the cavoatrial junction. Hepatobiliary: Normal liver size. No liver mass. Moderately distended gallbladder. No radiopaque cholelithiasis. No gallbladder wall thickening. No biliary ductal dilatation. Pancreas: Normal, with no mass or duct dilation. Spleen: Normal size. No mass. Adrenals/Urinary Tract: Normal adrenals. Moderate asymmetric right renal atrophy with multifocal renal cortical scarring in the right kidney, unchanged. No hydronephrosis. No renal masses. Bladder obscured by streak artifact from left hip hardware. Collapsed bladder with suggestion of mild diffuse bladder wall thickening. Stomach/Bowel: Normal non-distended stomach. Normal caliber small bowel with no small bowel wall thickening. Appendectomy. Diverting descending colon loop colostomy in the ventral left  abdominal wall. Suggestion of a 5.4 x 3.4 cm irregular annular rectal mass, poorly visualized due to streak artifact from left hip hardware (series 2/image 70). Otherwise no large bowel wall thickening or significant diverticulosis. Vascular/Lymphatic: Atherosclerotic nonaneurysmal abdominal aorta. Patent portal, splenic, hepatic and renal veins. Mildly enlarged 1.1 cm right inguinal node (series 2/image 76), unchanged from 04/22/2023 CT. No new pathologically enlarged abdominopelvic nodes. Reproductive: Apparent hysterectomy.  No adnexal masses. Other: Small volume ascites, predominantly right pericolic gutter and pelvic. No pneumoperitoneum. No focal fluid collection. Musculoskeletal: No aggressive appearing focal osseous lesions. Mild thoracolumbar  spondylosis. Left total hip arthroplasty. IMPRESSION: 1. No evidence of bowel obstruction or acute bowel inflammation. 2. Suggestion of a 5.4 x 3.4 cm irregular annular rectal mass, poorly visualized due to streak artifact from left hip hardware. Viable rectal tumor not excluded. 3. Small volume ascites, predominantly right paracolic gutter and pelvic. 4. Mildly enlarged right inguinal node, unchanged from 04/22/2023 CT. No new pathologically enlarged abdominopelvic nodes. 5. Collapsed bladder with suggestion of mild diffuse bladder wall thickening, nonspecific, cannot exclude cystitis. Suggest correlation with urinalysis. No hydronephrosis. 6. Moderately distended gallbladder, nonspecific. No radiopaque cholelithiasis. No gallbladder wall thickening. No biliary ductal dilatation. 7. Chronic moderate asymmetric right renal atrophy. 8.  Aortic Atherosclerosis (ICD10-I70.0). Electronically Signed   By: Delbert Phenix M.D.   On: 10/12/2023 19:40    Microbiology: Results for orders placed or performed during the hospital encounter of 10/14/23  Culture, blood (Routine x 2)     Status: None   Collection Time: 10/14/23  8:25 PM   Specimen: BLOOD  Result Value Ref Range Status   Specimen Description   Final    BLOOD RIGHT ANTECUBITAL Performed at [facility], [address], [place], [place] [address]    Special Requests   Final    BOTTLES DRAWN AEROBIC AND ANAEROBIC Blood Culture results may not be optimal due to an inadequate volume of blood received in culture bottles Performed at [facility], [address] [place], [place] [address]    Culture   Final    NO GROWTH 5 DAYS Performed at [facility], [address] [place], [place] [address]    Report Status 10/19/2023 FINAL  Final  Resp panel by RT-PCR (RSV, Flu A&B, Covid) Anterior Nasal Swab     Status: None   Collection Time: 10/14/23  8:52 PM   Specimen: Anterior Nasal Swab  Result Value Ref Range  Status   SARS Coronavirus 2 by RT PCR NEGATIVE NEGATIVE Final    Comment: (NOTE) SARS-CoV-2 target nucleic acids are NOT DETECTED.  The SARS-CoV-2 RNA is generally detectable in upper respiratory specimens during the acute phase of infection. The lowest concentration of SARS-CoV-2 viral copies this assay can detect is 138 copies/mL. A negative result does not preclude SARS-Cov-2 infection and should not be used as the sole basis for treatment or other patient management decisions. A negative result may occur with  improper specimen collection/handling, submission of specimen other than nasopharyngeal swab, presence of viral mutation(s) within the areas targeted by this assay, and inadequate number of viral copies(<138 copies/mL). A negative result must be combined with clinical observations, patient history, and epidemiological information. The expected result is Negative.  Fact Sheet for Patients:  BloggerCourse.com  Fact Sheet for Healthcare Providers:  SeriousBroker.it  This test is no t yet approved or cleared by the [place] FDA and  has been authorized for detection and/or diagnosis of SARS-CoV-2 by  FDA under an Emergency Use Authorization (EUA). This EUA will remain  in effect (meaning this test can be used) for the duration of the COVID-19 declaration under Section 564(b)(1) of the Act, 21 U.S.C.section 360bbb-3(b)(1), unless the authorization is terminated  or revoked sooner.       Influenza A by PCR NEGATIVE NEGATIVE Final   Influenza B by PCR NEGATIVE NEGATIVE Final    Comment: (NOTE) The Xpert Xpress SARS-CoV-2/FLU/RSV plus assay is intended as an aid in the diagnosis of influenza from Nasopharyngeal swab specimens and should not be used as a sole basis for treatment. Nasal washings and aspirates are unacceptable for Xpert Xpress SARS-CoV-2/FLU/RSV testing.  Fact Sheet for  Patients: BloggerCourse.com  Fact Sheet for Healthcare Providers: SeriousBroker.it  This test is not yet approved or cleared by the [place] FDA and has been authorized for detection and/or diagnosis of SARS-CoV-2 by FDA under an Emergency Use Authorization (EUA). This EUA will remain in effect (meaning this test can be used) for the duration of the COVID-19 declaration under Section 564(b)(1) of the Act, 21 U.S.C. section 360bbb-3(b)(1), unless the authorization is terminated or revoked.     Resp Syncytial Virus by PCR NEGATIVE NEGATIVE Final    Comment: (NOTE) Fact Sheet for Patients: BloggerCourse.com  Fact Sheet for Healthcare Providers: SeriousBroker.it  This test is not yet approved or cleared by the [place] FDA and has been authorized for detection and/or diagnosis of SARS-CoV-2 by FDA under an Emergency Use Authorization (EUA). This EUA will remain in effect (meaning this test can be used) for the duration of the COVID-19 declaration under Section 564(b)(1) of the Act, 21 U.S.C. section 360bbb-3(b)(1), unless the authorization is terminated or revoked.  Performed at [facility], [address] [place], [place] [address]   Urine Culture     Status: Abnormal   Collection Time: 10/14/23 10:04 PM   Specimen: Urine, Clean Catch  Result Value Ref Range Status   Specimen Description   Final    URINE, CLEAN CATCH Performed at [facility], [address], [place], [place] [address]    Special Requests   Final    NONE Performed at [facility], [address], [place], [place] [address]    Culture (A)  Final    >=100,000 COLONIES/mL KLEBSIELLA PNEUMONIAE 10,000 COLONIES/mL PROTEUS MIRABILIS Confirmed Extended Spectrum Beta-Lactamase Producer (ESBL).  In bloodstream infections from ESBL organisms,  carbapenems are preferred over piperacillin/tazobactam. They are shown to have a lower risk of mortality. KLEBSIELLA PNEUMONIAE    Report Status 10/17/2023 FINAL  Final   Organism ID, Bacteria KLEBSIELLA PNEUMONIAE (A)  Final   Organism ID, Bacteria PROTEUS MIRABILIS (A)  Final      Susceptibility   Klebsiella pneumoniae - MIC*    AMPICILLIN >=32 RESISTANT Resistant     CEFAZOLIN >=64 RESISTANT Resistant     CEFEPIME >=32 RESISTANT Resistant     CEFTRIAXONE >=64 RESISTANT Resistant     CIPROFLOXACIN 1 RESISTANT Resistant     GENTAMICIN >=16 RESISTANT Resistant     IMIPENEM <=0.25 SENSITIVE Sensitive     NITROFURANTOIN 64 INTERMEDIATE Intermediate     TRIMETH/SULFA >=320 RESISTANT Resistant     AMPICILLIN/SULBACTAM 16 INTERMEDIATE Intermediate     PIP/TAZO <=4 SENSITIVE Sensitive ug/mL    * >=100,000 COLONIES/mL KLEBSIELLA PNEUMONIAE   Proteus mirabilis - MIC*    AMPICILLIN <=2 SENSITIVE Sensitive     CEFAZOLIN 8 SENSITIVE Sensitive     CEFEPIME <=0.12  SENSITIVE Sensitive     CEFTRIAXONE <=0.25 SENSITIVE Sensitive     CIPROFLOXACIN <=0.25 SENSITIVE Sensitive     GENTAMICIN <=1 SENSITIVE Sensitive     IMIPENEM 2 SENSITIVE Sensitive     NITROFURANTOIN 128 RESISTANT Resistant     TRIMETH/SULFA <=20 SENSITIVE Sensitive     AMPICILLIN/SULBACTAM <=2 SENSITIVE Sensitive     PIP/TAZO <=4 SENSITIVE Sensitive ug/mL    * 10,000 COLONIES/mL PROTEUS MIRABILIS  Culture, blood (Routine x 2)     Status: None   Collection Time: 10/15/23  5:19 AM   Specimen: BLOOD RIGHT HAND  Result Value Ref Range Status   Specimen Description   Final    BLOOD RIGHT HAND Performed at [facility], [address] [place], [place] [address]    Special Requests   Final    BOTTLES DRAWN AEROBIC ONLY Blood Culture results may not be optimal due to an inadequate volume of blood received in culture bottles Performed at [facility], [address] [place], [place] [address]    Culture    Final    NO GROWTH 5 DAYS Performed at [facility], [address], [place], [place] [address]    Report Status 10/20/2023 FINAL  Final    Labs: CBC: Recent Labs  Lab 10/14/23 2025 10/15/23 0509 10/16/23 0307 10/17/23 0302 10/18/23 0809 10/19/23 0323 10/20/23 0403  WBC 13.0*   < > 10.3 8.4 7.9 7.6 8.1  NEUTROABS 12.0*  --   --   --  6.5 5.9 6.6  HGB 10.9*   < > 9.2* 8.0* 8.2* 7.8* 10.0*  HCT 35.6*   < > 29.8* 25.7* 26.4* 26.6* 31.2*  MCV 90.6   < > 89.0 89.2 88.3 92.0 87.6  PLT 223   < > 181 171 173 187 204   < > = values in this interval not displayed.   Basic Metabolic Panel: Recent Labs  Lab 10/15/23 0509 10/16/23 0307 10/17/23 0302 10/19/23 0323 10/19/23 1632 10/20/23 0403  NA 129* 130* 134* 135  --  132*  K 4.3 3.8 3.5 3.0*  --  3.4*  CL 98 101 102 102  --  102  CO2 20* 20* 23 24  --  26  GLUCOSE 147* 153* 210* 281*  --  339*  BUN 33* 30* 24* 18  --  14  CREATININE 0.95 0.95 0.85 0.84  --  0.73  CALCIUM 8.1* 7.9* 7.8* 7.9*  --  7.7*  MG  --   --   --  1.4*  --  1.9  PHOS  --   --   --  1.2* 3.1 2.2*   Liver Function Tests: Recent Labs  Lab 10/14/23 0426 10/14/23 2025 10/19/23 0323 10/20/23 0403  AST 13* 14* 19 25  ALT 9 8 13 17   ALKPHOS 71 83 89 101  BILITOT 0.7 1.3* 0.2 0.4  PROT 5.6* 6.1* 5.1* 5.5*  ALBUMIN 2.5* 2.9* 1.9* 2.0*   CBG: Recent Labs  Lab 10/19/23 1103 10/19/23 1615 10/19/23 2112 10/20/23 0738 10/20/23 1124  GLUCAP 249* 284* 186* 252* 201*    Discharge time spent: greater than 30 minutes.  Signed: Debarah Crape, DO [facility] 10/20/2023

## 2023-10-20 NOTE — Progress Notes (Signed)
Mobility Specialist - Progress Note   10/20/23 0928  Mobility  Activity Dangled on edge of bed  Level of Assistance Maximum assist, patient does 25-49%  Assistive Device Front wheel walker  Activity Response Tolerated well  Mobility Referral Yes  $Mobility charge 1 Mobility  Mobility Specialist Start Time (ACUTE ONLY) [id number]  Mobility Specialist Stop Time (ACUTE ONLY) [id number]  Mobility Specialist Time Calculation (min) (ACUTE ONLY) 23 min   Pt received in bed and agreeable to mobility. Pt was modA from supine>sitting. Pt unable to stand after x3 attempts. Once seated EOB, pt could not pull self back into bed without support. Pt requiring +2 assistance with getting back into bed. No complaints during session. Pt to bed after session with all needs met.   [facility]

## 2023-10-20 NOTE — Progress Notes (Signed)
Occupational Therapy Treatment Patient Details Name: [name] MRN: [id number] DOB: 05/28/1946 Today's Date: 10/20/2023   History of present illness [name] is a 77 yr old female admitted to the hospital 10-14-23 with suprapubic pain, nausea, vomiting, and fatigue. She was found to have UTI. PMH: rectal CA s/p colectomy, recurrent cervical adenocarcinoma, CVA, CAD, DM II, chronic pain, chronic anemia, arthritis, L THA   OT comments  The pt required increased assist for bed mobility. She presented with poor sitting balance/posterior leaning sitting EOB; she required increased assist and cues to correct. She further required max to total assist for upper and lower body dressing seated EOB. She was noted to be with general deconditioning and weakness. Continue OT plan of care. Patient will benefit from continued inpatient follow up therapy, <3 hours/day.       If plan is discharge home, recommend the following:  Help with stairs or ramp for entrance;Assistance with cooking/housework;Assist for transportation;Direct supervision/assist for medications management;A lot of help with walking and/or transfers;A lot of help with bathing/dressing/bathroom   Equipment Recommendations  None recommended by OT    Recommendations for Other Services      Precautions / Restrictions Precautions Precautions: Fall Restrictions Weight Bearing Restrictions: No       Mobility Bed Mobility Overal bed mobility: Needs Assistance Bed Mobility: Supine to Sit, Sit to Supine     Supine to sit: Max assist Sit to supine: Max assist   General bed mobility comments: Increased time with assist to bring trunk and to scoot to EOB with cues          ADL either performed or assessed with clinical judgement   ADL Overall ADL's : Needs assistance/impaired                 Upper Body Dressing : Maximal assistance;Sitting Upper Body Dressing Details (indicate cue type and reason): The pt was  assisted into sitting EOB where she was instructed on performing upper body dressing. She presented with poor sitting balance/posterior leaning. She required increased assist to maintain sitting balance, as well as to doff a hospital gown then to don an overhead gown. Lower Body Dressing: Total assistance Lower Body Dressing Details (indicate cue type and reason): She required total assist to don her socks seated EOB, given generalized weakness and poor sitting balance.                     Cognition Arousal: Alert Behavior During Therapy: WFL for tasks assessed/performed                        Pertinent Vitals/ Pain       Pain Assessment Pain Assessment: No/denies pain         Frequency  Min 1X/week        Progress Toward Goals  OT Goals(current goals can now be found in the care plan section)     Acute Rehab OT Goals OT Goal Formulation: With patient Time For Goal Achievement: 10/30/23 Potential to Achieve Goals: Fair  Plan         AM-PAC OT "6 Clicks" Daily Activity     Outcome Measure   Help from another person eating meals?: A Little Help from another person taking care of personal grooming?: A Little Help from another person toileting, which includes using toliet, bedpan, or urinal?: A Lot Help from another person bathing (including washing, rinsing, drying)?: A Lot Help from another person to  put on and taking off regular upper body clothing?: A Lot Help from another person to put on and taking off regular lower body clothing?: Total 6 Click Score: 13    End of Session Equipment Utilized During Treatment: Other (comment) (N/A)  OT Visit Diagnosis: Unsteadiness on feet (R26.81);Muscle weakness (generalized) (M62.81)   Activity Tolerance Patient limited by fatigue   Patient Left in bed;with call bell/phone within reach;with bed alarm set;with family/visitor present   Nurse Communication Mobility status        Time: 1610-9604 OT Time  Calculation (min): 19 min  Charges: OT General Charges $OT Visit: 1 Visit OT Treatments $Therapeutic Activity: 8-22 mins     Reuben Likes, OTR/L 10/20/2023, 2:36 PM

## 2023-10-20 NOTE — Plan of Care (Signed)
  Problem: Health Behavior/Discharge Planning: Goal: Ability to manage health-related needs will improve 10/20/2023 0340 by Hilliard Clark, RN Outcome: Progressing 10/20/2023 0340 by Hilliard Clark, RN Reactivated

## 2023-10-28 ENCOUNTER — Other Ambulatory Visit: Payer: Self-pay

## 2023-10-28 DIAGNOSIS — E559 Vitamin D deficiency, unspecified: Secondary | ICD-10-CM

## 2023-10-28 DIAGNOSIS — D649 Anemia, unspecified: Secondary | ICD-10-CM

## 2023-10-28 DIAGNOSIS — E039 Hypothyroidism, unspecified: Secondary | ICD-10-CM

## 2023-10-28 NOTE — Progress Notes (Signed)
Additional labs requested by PCP office for 11/24/23 draw.

## 2023-10-31 ENCOUNTER — Other Ambulatory Visit: Payer: Self-pay | Admitting: Oncology

## 2023-11-23 ENCOUNTER — Other Ambulatory Visit: Payer: Self-pay | Admitting: *Deleted

## 2023-11-23 DIAGNOSIS — N390 Urinary tract infection, site not specified: Secondary | ICD-10-CM

## 2023-11-23 NOTE — Progress Notes (Signed)
 Received orders for lab work on 11/24/23 at request of Dr. Duanne Guess, MD.

## 2023-11-24 ENCOUNTER — Inpatient Hospital Stay: Payer: Medicare HMO | Attending: Nurse Practitioner

## 2023-11-24 ENCOUNTER — Telehealth: Payer: Self-pay

## 2023-11-24 ENCOUNTER — Other Ambulatory Visit: Payer: Medicare HMO

## 2023-11-24 ENCOUNTER — Inpatient Hospital Stay: Payer: Medicare HMO

## 2023-11-24 ENCOUNTER — Other Ambulatory Visit: Payer: Self-pay

## 2023-11-24 ENCOUNTER — Inpatient Hospital Stay (HOSPITAL_BASED_OUTPATIENT_CLINIC_OR_DEPARTMENT_OTHER): Payer: Medicare HMO | Admitting: Oncology

## 2023-11-24 VITALS — BP 134/48 | HR 61 | Temp 98.1°F | Resp 18 | Ht 60.0 in | Wt 151.0 lb

## 2023-11-24 DIAGNOSIS — E559 Vitamin D deficiency, unspecified: Secondary | ICD-10-CM | POA: Insufficient documentation

## 2023-11-24 DIAGNOSIS — D500 Iron deficiency anemia secondary to blood loss (chronic): Secondary | ICD-10-CM | POA: Diagnosis not present

## 2023-11-24 DIAGNOSIS — C20 Malignant neoplasm of rectum: Secondary | ICD-10-CM

## 2023-11-24 DIAGNOSIS — E039 Hypothyroidism, unspecified: Secondary | ICD-10-CM

## 2023-11-24 DIAGNOSIS — K922 Gastrointestinal hemorrhage, unspecified: Secondary | ICD-10-CM | POA: Insufficient documentation

## 2023-11-24 DIAGNOSIS — Z85048 Personal history of other malignant neoplasm of rectum, rectosigmoid junction, and anus: Secondary | ICD-10-CM | POA: Diagnosis not present

## 2023-11-24 DIAGNOSIS — Z7984 Long term (current) use of oral hypoglycemic drugs: Secondary | ICD-10-CM | POA: Insufficient documentation

## 2023-11-24 DIAGNOSIS — D649 Anemia, unspecified: Secondary | ICD-10-CM

## 2023-11-24 DIAGNOSIS — E0590 Thyrotoxicosis, unspecified without thyrotoxic crisis or storm: Secondary | ICD-10-CM | POA: Insufficient documentation

## 2023-11-24 DIAGNOSIS — Z95828 Presence of other vascular implants and grafts: Secondary | ICD-10-CM

## 2023-11-24 DIAGNOSIS — N390 Urinary tract infection, site not specified: Secondary | ICD-10-CM

## 2023-11-24 LAB — CMP (CANCER CENTER ONLY)
ALT: 8 U/L (ref 0–44)
AST: 13 U/L — ABNORMAL LOW (ref 15–41)
Albumin: 3.4 g/dL — ABNORMAL LOW (ref 3.5–5.0)
Alkaline Phosphatase: 89 U/L (ref 38–126)
Anion gap: 10 (ref 5–15)
BUN: 17 mg/dL (ref 8–23)
CO2: 27 mmol/L (ref 22–32)
Calcium: 7.8 mg/dL — ABNORMAL LOW (ref 8.9–10.3)
Chloride: 104 mmol/L (ref 98–111)
Creatinine: 0.69 mg/dL (ref 0.44–1.00)
GFR, Estimated: >60 mL/min (ref >60)
Glucose, Bld: 163 mg/dL — ABNORMAL HIGH (ref 70–99)
Potassium: 3.7 mmol/L (ref 3.5–5.1)
Sodium: 141 mmol/L (ref 135–145)
Total Bilirubin: 0.5 mg/dL (ref 0.0–1.2)
Total Protein: 6 g/dL — ABNORMAL LOW (ref 6.5–8.1)

## 2023-11-24 LAB — CBC WITH DIFFERENTIAL (CANCER CENTER ONLY)
Abs Immature Granulocytes: 0.03 10*3/uL (ref 0.00–0.07)
Basophils Absolute: 0 10*3/uL (ref 0.0–0.1)
Basophils Relative: 0 %
Eosinophils Absolute: 0.1 10*3/uL (ref 0.0–0.5)
Eosinophils Relative: 2 %
HCT: 30.3 % — ABNORMAL LOW (ref 36.0–46.0)
Hemoglobin: 9.3 g/dL — ABNORMAL LOW (ref 12.0–15.0)
Immature Granulocytes: 1 %
Lymphocytes Relative: 14 %
Lymphs Abs: 0.8 10*3/uL (ref 0.7–4.0)
MCH: 27 pg (ref 26.0–34.0)
MCHC: 30.7 g/dL (ref 30.0–36.0)
MCV: 87.8 fL (ref 80.0–100.0)
Monocytes Absolute: 0.4 10*3/uL (ref 0.1–1.0)
Monocytes Relative: 7 %
Neutro Abs: 4.6 10*3/uL (ref 1.7–7.7)
Neutrophils Relative %: 76 %
Platelet Count: 169 10*3/uL (ref 150–400)
RBC: 3.45 MIL/uL — ABNORMAL LOW (ref 3.87–5.11)
RDW: 15.5 % (ref 11.5–15.5)
WBC Count: 6 10*3/uL (ref 4.0–10.5)
nRBC: 0 % (ref 0.0–0.2)

## 2023-11-24 LAB — MAGNESIUM: Magnesium: 0.9 mg/dL — CL (ref 1.7–2.4)

## 2023-11-24 LAB — IRON AND TIBC
Iron: 29 ug/dL (ref 28–170)
Saturation Ratios: 10 % — ABNORMAL LOW (ref 10.4–31.8)
TIBC: 288 ug/dL (ref 250–450)
UIBC: 259 ug/dL

## 2023-11-24 LAB — VITAMIN D 25 HYDROXY (VIT D DEFICIENCY, FRACTURES): Vit D, 25-Hydroxy: 15.03 ng/mL — ABNORMAL LOW (ref 30–100)

## 2023-11-24 LAB — FOLATE: Folate: 28.4 ng/mL (ref >5.9)

## 2023-11-24 LAB — TSH: TSH: 8.748 u[IU]/mL — ABNORMAL HIGH (ref 0.350–4.500)

## 2023-11-24 LAB — FERRITIN: Ferritin: 26 ng/mL (ref 11–307)

## 2023-11-24 LAB — VITAMIN B12: Vitamin B-12: 117 pg/mL — ABNORMAL LOW (ref 180–914)

## 2023-11-24 MED ORDER — MAGNESIUM OXIDE -MG SUPPLEMENT 400 (240 MG) MG PO TABS: 2.0000 | ORAL_TABLET | Freq: Two times a day (BID) | ORAL | 1 refills | Status: DC

## 2023-11-24 MED ORDER — SODIUM CHLORIDE 0.9% FLUSH
10.0000 mL | Freq: Once | INTRAVENOUS | Status: AC
Start: 2023-11-24 — End: 2023-11-24
  Administered 2023-11-24: 10 mL via INTRAVENOUS

## 2023-11-24 MED ORDER — PANTOPRAZOLE SODIUM 40 MG PO TBEC: 40.0000 mg | DELAYED_RELEASE_TABLET | Freq: Every day | ORAL | 0 refills | Status: DC

## 2023-11-24 MED ORDER — DOXYCYCLINE HYCLATE 100 MG PO TABS: 100.0000 mg | ORAL_TABLET | Freq: Two times a day (BID) | ORAL | 0 refills | Status: DC

## 2023-11-24 MED ORDER — HEPARIN SOD (PORK) LOCK FLUSH 100 UNIT/ML IV SOLN
500.0000 [IU] | Freq: Once | INTRAVENOUS | Status: AC
Start: 2023-11-24 — End: 2023-11-24
  Administered 2023-11-24: 500 [IU] via INTRAVENOUS

## 2023-11-24 NOTE — Progress Notes (Signed)
 Magnesium IV orders placed for infusion to be administered on 11/26/23.

## 2023-11-24 NOTE — Telephone Encounter (Signed)
 Spoke with patient to let her know to restart her Protonix once per day. Patient understood but needs an Rx to [facility] on [address]. Will refill per Dr. Truett Perna.

## 2023-11-24 NOTE — Progress Notes (Signed)
 CRITICAL VALUE STICKER  CRITICAL VALUE: Mg+ 0.9  RECEIVER (on-site recipient of call):Diem Pagnotta,RN  DATE & TIME NOTIFIED: 11/24/23 at 1120  MESSENGER (representative from lab):Marinda  MD NOTIFIED: Dr. Cloretta  TIME OF NOTIFICATION: 1124  RESPONSE:  4 grams IV Mg+ on 11/26/23 and increase oral Mg+ to 800 mg bid

## 2023-11-24 NOTE — Patient Instructions (Signed)

## 2023-11-24 NOTE — Progress Notes (Signed)
 [facility] OFFICE PROGRESS NOTE   Diagnosis: Rectal cancer  INTERVAL HISTORY:   [name] returns as scheduled.  She feels well.  She continues to have a mucus discharge from the rectum.  No bleeding from the colostomy.  She has noted increased leg swelling and right greater than left lower leg erythema for the past few days.  No fever. She was admitted on 10/14/2023 with a urinary tract infection.  The hemoglobin returned at 7.8 on 10/19/2023 and she was transfused 1 unit of packed red blood cells.  Objective:  Vital signs in last 24 hours:  Blood pressure (!) 134/48, pulse 61, temperature 98.1 F (36.7 C), temperature source Temporal, resp. rate 18, height 5' (1.524 m), weight 151 lb (68.5 kg), SpO2 98%.    HEENT: No thrush Resp: Lungs clear bilaterally Cardio: Regular rate and rhythm  GI: No hepatosplenomegaly, left lower quadrant colostomy Vascular: 1+ edema at the right greater than left lower leg.  Skin: Erythema at the right greater than left pretibial area, 1 cm abrasion at the right pretibial area  Portacath/PICC-without erythema  Lab Results:  Lab Results  Component Value Date   WBC 6.0 11/24/2023   HGB 9.3 (L) 11/24/2023   HCT 30.3 (L) 11/24/2023   MCV 87.8 11/24/2023   PLT 169 11/24/2023   NEUTROABS 4.6 11/24/2023    CMP  Lab Results  Component Value Date   NA 132 (L) 10/20/2023   K 3.4 (L) 10/20/2023   CL 102 10/20/2023   CO2 26 10/20/2023   GLUCOSE 339 (H) 10/20/2023   BUN 14 10/20/2023   CREATININE 0.73 10/20/2023   CALCIUM  7.7 (L) 10/20/2023   PROT 5.5 (L) 10/20/2023   ALBUMIN 2.0 (L) 10/20/2023   AST 25 10/20/2023   ALT 17 10/20/2023   ALKPHOS 101 10/20/2023   BILITOT 0.4 10/20/2023   GFRNONAA >60 10/20/2023   GFRAA >60 03/29/2020    Lab Results  Component Value Date   CEA1 28.60 (H) 06/12/2021   CEA 44.01 (H) 02/02/2023    Lab Results  Component Value Date   INR 1.4 (H) 10/14/2023   LABPROT 17.1 (H) 10/14/2023     Imaging:  No results found.  Medications: I have reviewed the patient's current medications.   Assessment/Plan: Rectal cancer Partially obstructing mass beginning at 10 cm from the anal verge on colonoscopy 01/23/2021, biopsy-adenocarcinoma, immunohistochemical stains at [facility]-CK20, CDX-2, and SATB2 positive, patchy strong staining for p16.  The rectal tumor has a distinct immunohistochemical profile suggesting a primary colorectal tumor as opposed to metastatic cervical cancer. CT abdomen/pelvis 01/18/2021-bilateral hydronephrosis, endometrial fluid collection, prominent stool in the colon MRI pelvis 01/23/2021-tumor at 7.6 cm from the anal verge, abnormal signal bridge in the cervix, mesorectum, and anterior aspect of the rectum-similar to MRI from 2020, MRI stage could not be defined secondary to posttreatment changes in the pelvis 03/18/2021-total abdominal hysterectomy, cystoscopy with insertion of ureteral stents, exploratory laparotomy, aborted low anterior resection, descending loop colostomy--right and left fallopian tubes and ovaries negative for malignancy; excision portion of cervix positive for adenocarcinoma; uterus with invasive moderately differentiated adenocarcinoma of the cervix, HPV associated, invading through full-thickness of the cervix/lower uterine segment and into parametrial tissue, margins of resection received disrupted, cannot be evaluated; remaining uterus with extensive endocervical adenocarcinoma in situ involving lower uterine segment and replacing endometrium.  This carcinoma was felt to be a separate primary from the rectal tumor. PET scan at [facility] 04/11/2021-intense FDG activity in region of known rectal mass.  Hypermetabolic left external iliac and right inguinal lymph nodes.  Minimal FDG uptake and small right supraclavicular lymph node.  No uptake seen in the cervix. 04/23/2021 FNA right inguinal lymph node-negative for malignancy Evaluated by Dr. Markham 05/21/2021,  05/28/2021-appears to have 2 synchronous malignancies (cervical adenocarcinoma and rectal adenocarcinoma); further surgery which would entail pelvic exenteration not recommended by gynecologic oncology or colorectal surgery.  Full doses of radiotherapy not felt to be feasible.  Recommended 3 months of CAPOX and then repeat MRI of abdomen/pelvis, chest CT and colonoscopy. Cycle 1 Xeloda 06/13/2021 Cycle 1 FOLFOX 07/08/2021, oxaliplatin  dose reduced secondary to pre-existing neuropathy Cycle 2 FOLFOX 07/29/2021, Udenyca  Cycle 3 FOLFOX 08/12/2021, Udenyca  Cycle 4 FOLFOX 09/02/2021, Udenyca  Cycle 5 FOLFOX 09/17/2021, Udenyca  CT[facility] at [facility] 10/01/2021-unchanged findings of rectal malignancy status posttreatment.  Marked thickening of the proximal duodenal wall.  Unchanged bilateral inguinal lymph nodes which are not enlarged but avid on prior PET.  Unchanged diffuse bladder wall thickening and perivesicular stranding which may represent radiation cystitis.  New mild right lower lobe bronchial wall thickening, mucus impaction and tree-in-bud pulmonary nodules. Cycle 6 FOLFOX 10/22/2021, Udenyca  CT 01/21/2022-stable soft tissue thickening at the lower rectum, slight increase in adjacent stranding no evidence of metastatic disease in the chest, abdomen, or pelvis, interval diverting loop colostomy CT pelvis and lower extremities 04/22/2023-subcutaneous fat stranding in the lower legs bilaterally on the right greater than left, enlarged right inguinal lymph nodes, total left hip arthroplasty with stable acetabular bone erosions, ill-defined soft tissue density in the region of the distal rectum with surrounding fat stranding unchanged     Bilateral hydronephrosis-secondary to urinary retention? Hematometra found on pelvic MRI 01/23/2021, evaluated by gynecologic oncology Cervical cancer November 1994, stage Ib, treated with external beam radiation and brachytherapy at [facility] Diabetes Neuropathy Chronic pain secondary to  #6 Recurrent urinary tract infections History of a CVA History of hyperthyroidism G3, P3 COVID-19 infection January 2021 Admission 08/25/2021 with a urinary tract infection-culture negative Anemia secondary to chronic disease, chemotherapy, and rectal bleeding-1 unit packed red blood cells 08/26/2021 Hospital admission 09/23/2021-symptomatic anemia 16.  Admission 10/08/2021 with altered mental status, nausea/vomiting, and left foot ulcers Urine culture 10/08/2021 positive for Pseudomonas aeruginosa EGD 10/12/2021-gastric and duodenal ulcers-not bleeding 17.  Admission 11/29/2021 through 12/02/2021 with COVID-pneumonia 18.  Right lower extremity cellulitis 04/22/2023-Bactrim , repeat course of Bactrim  05/07/2023 19.  Admission 09/10/2023 with severe symptomatic anemia, dark stool-Hemoccult positive 3 units packed red blood cells Upper endoscopy 09/11/2023: Duodenal ulcers with strictures likely source of GI bleeding, no active bleeding.      Disposition: [name] has a history of rectal cancer.  There is no clinical evidence of disease progression.  She has chronic lower extremity edema, likely secondary to pelvic radiation.  She has intermittent erythema at the lower legs.  She request a course of antibiotics.  She will begin doxycycline .  [name] has chronic hypomagnesemia, likely related to loss from the GI tract and potential renal loss following oxaliplatin .  She will increase the dose of the magnesium  supplement.  She will receive IV magnesium  at the cancer center this week.  [name] will return for an office and lab visit in approximately 6 weeks.  She has anemia secondary to chronic disease, GI blood loss, and infections.  I recommended she resume Protonix .  Arley Hof, MD  11/24/2023  11:15 AM

## 2023-11-25 ENCOUNTER — Other Ambulatory Visit: Payer: Self-pay | Admitting: *Deleted

## 2023-11-25 ENCOUNTER — Telehealth: Payer: Self-pay | Admitting: *Deleted

## 2023-11-25 DIAGNOSIS — D649 Anemia, unspecified: Secondary | ICD-10-CM

## 2023-11-25 NOTE — Telephone Encounter (Signed)
 Informed daughter that all labs were routed to PCP office. Dr. Truett Perna suggests she start vitamin b12 1000 mcg daily. Daughter reports she is already taking this (not on med list). She will discuss results w/PCP when they talk later this week.

## 2023-11-26 ENCOUNTER — Inpatient Hospital Stay: Payer: Medicare HMO

## 2023-11-26 MED ORDER — SODIUM CHLORIDE 0.9 % IV SOLN
INTRAVENOUS | Status: DC
Start: 2023-11-26 — End: 2023-11-26

## 2023-11-26 MED ORDER — HEPARIN SOD (PORK) LOCK FLUSH 100 UNIT/ML IV SOLN
500.0000 [IU] | Freq: Once | INTRAVENOUS | Status: AC
  Administered 2023-11-26: 500 [IU] via INTRAVENOUS

## 2023-11-26 MED ORDER — SODIUM CHLORIDE 0.9% FLUSH
10.0000 mL | Freq: Once | INTRAVENOUS | Status: AC
  Administered 2023-11-26: 10 mL via INTRAVENOUS

## 2023-11-26 MED ORDER — MAGNESIUM SULFATE 4 GM/100ML IV SOLN
4.0000 g | Freq: Once | INTRAVENOUS | Status: AC
  Administered 2023-11-26: 4 g via INTRAVENOUS
  Filled 2023-11-26: qty 100

## 2023-11-26 NOTE — Patient Instructions (Signed)
 Hypomagnesemia Hypomagnesemia is a condition in which the level of magnesium  in the blood is too low. Magnesium  is a mineral that is found in many foods. It is used in many different processes in the body. Hypomagnesemia can affect every organ in the body. In severe cases, it can cause life-threatening problems. What are the causes? This condition may be caused by: Not getting enough magnesium  in your diet or not having enough healthy foods to eat (malnutrition). Problems with magnesium  absorption in the intestines. Dehydration. Excessive use of alcohol. Vomiting. Severe or long-term (chronic) diarrhea. Some medicines, including medicines that make you urinate more often (diuretics). Certain diseases, such as kidney disease, diabetes, celiac disease, and overactive thyroid . What are the signs or symptoms? Symptoms of this condition include: Loss of appetite, nausea, and vomiting. Involuntary shaking or trembling of a body part (tremor). Muscle weakness or tingling in the arms and legs. Sudden tightening of muscles (muscle spasms). Confusion. Psychiatric issues, such as: Depression and irritability. Psychosis. A feeling of fluttering of the heart (palpitations). Seizures. These symptoms are more severe if magnesium  levels drop suddenly. How is this diagnosed? This condition may be diagnosed based on: Your symptoms and medical history. A physical exam. Blood and urine tests. How is this treated? Treatment depends on the cause and the severity of the condition. It may be treated by: Taking a magnesium  supplement. This can be taken in pill form. If the condition is severe, magnesium  is usually given through an IV. Making changes to your diet. You may be directed to eat foods that have a lot of magnesium , such as green leafy vegetables, peas, beans, and nuts. Not drinking alcohol. If you are struggling not to drink, ask your health care provider for help. Follow these instructions at  home: Eating and drinking     Make sure that your diet includes foods with magnesium . Foods that have a lot of magnesium  in them include: Green leafy vegetables, such as spinach and broccoli. Beans and peas. Nuts and seeds, such as almonds and sunflower seeds. Whole grains, such as whole grain bread and fortified cereals. Drink fluids that contain salts and minerals (electrolytes), such as sports drinks, when you are active. Do not drink alcohol. General instructions Take over-the-counter and prescription medicines only as told by your health care provider. Take magnesium  supplements as directed if your health care provider tells you to take them. Have your magnesium  levels monitored as told by your health care provider. Keep all follow-up visits. This is important. Contact a health care provider if: You get worse instead of better. Your symptoms return. Get help right away if: You develop severe muscle weakness. You have trouble breathing. You feel that your heart is racing. These symptoms may represent a serious problem that is an emergency. Do not wait to see if the symptoms will go away. Get medical help right away. Call your local emergency services (911 in the [place] Do not drive yourself to the hospital. Summary Hypomagnesemia is a condition in which the level of magnesium  in the blood is too low. Hypomagnesemia can affect every organ in the body. Treatment may include eating more foods that contain magnesium , taking magnesium  supplements, and not drinking alcohol. Have your magnesium  levels monitored as told by your health care provider. This information is not intended to replace advice given to you by your health care provider. Make sure you discuss any questions you have with your health care provider. Document Revised: 04/02/2021 Document Reviewed: 04/02/2021 Elsevier Patient Education  2024 [employer] Inc[employer]

## 2023-11-28 ENCOUNTER — Other Ambulatory Visit: Payer: Self-pay | Admitting: Oncology

## 2023-11-30 ENCOUNTER — Ambulatory Visit: Payer: Medicare HMO | Admitting: Gastroenterology

## 2023-12-15 ENCOUNTER — Ambulatory Visit (INDEPENDENT_AMBULATORY_CARE_PROVIDER_SITE_OTHER): Payer: Medicare HMO | Admitting: Podiatry

## 2023-12-15 ENCOUNTER — Encounter: Payer: Self-pay | Admitting: Podiatry

## 2023-12-15 DIAGNOSIS — M25675 Stiffness of left foot, not elsewhere classified: Secondary | ICD-10-CM | POA: Diagnosis not present

## 2023-12-15 DIAGNOSIS — B353 Tinea pedis: Secondary | ICD-10-CM

## 2023-12-15 DIAGNOSIS — E1142 Type 2 diabetes mellitus with diabetic polyneuropathy: Secondary | ICD-10-CM | POA: Diagnosis not present

## 2023-12-15 DIAGNOSIS — L84 Corns and callosities: Secondary | ICD-10-CM

## 2023-12-15 DIAGNOSIS — R600 Localized edema: Secondary | ICD-10-CM | POA: Diagnosis not present

## 2023-12-15 MED ORDER — CLOTRIMAZOLE-BETAMETHASONE 1-0.05 % EX CREA: 1.0000 | TOPICAL_CREAM | Freq: Every day | CUTANEOUS | 0 refills | Status: DC

## 2023-12-15 NOTE — Progress Notes (Signed)
  Subjective:  Patient ID: [name], female    DOB: 27-May-1946,  MRN: [id number]  Chief Complaint  Patient presents with   Foot Pain    Patient states she is here to get her bilateral feet looked at due too cracks at the bottom of bilateral of heels. Patient also states that she has Callouses on bilateral feet. Patient states she can move her toes on her right foot but she can not move her toes on her left foot.    78 y.o. female presents with the above complaint. History confirmed with patient.  She presented reevaluation of her calluses on both feet.  Has not had any drainage.  She also has swelling on both limbs.  But seems to be worse on the right, she had an antibiotic for this but it did not completely get rid of it.  Also feeling she cannot move her toes well.  Objective:  Physical Exam: Decree sensation with polyneuropathy she has palpable pulses feet are warm and well-perfused she has +1 pitting edema bilaterally that is fairly symmetric she has hyperkeratotic lesions bilateral plantar IPJ without deep ulceration or active drainage she has cracking peeling skin on the posterior heel with erythematous rash.  Limited active range of motion of the toes on the left foot normal on the right foot she has active plantarflexion dorsiflexion of the ankle and subtalar joint  Assessment:   1. Callus   2. Type 2 diabetes mellitus with diabetic polyneuropathy, without long-term current use of insulin (HCC)   3. Tinea pedis of both feet   4. Peripheral edema   5. Joint stiffness of foot, left      Plan:  Patient was evaluated and treated and all questions answered.  Discussed the etiology and treatment options for tinea pedis.  Discussed topical and oral treatment.  Recommended topical treatment with Lotrisone cream.  This was sent to the patient's pharmacy.  Also   All symptomatic hyperkeratoses were safely debrided with a sterile #15 blade to patient's level of comfort without  incident. We discussed preventative and palliative care of these lesions including supportive and accommodative shoegear, padding, prefabricated and custom molded accommodative orthoses, use of a pumice stone and lotions/creams daily.  Silicone padding dispensed.  No active ulceration.  No indication for surgical intervention currently  She also has pitting edema we discussed peripheral edema and how this can be multifactorial does not appear to be cellulitic in nature.  She will discuss with her PCP regarding her fluid metabolism need for a fluid pill and renal and heart function.  Also discussed the impact of diet.  Regarding her decreased motor function in her toes on her left foot this does not appear to be function limiting she has no evidence of dropfoot.  Would continue to monitor.     Return if symptoms worsen or fail to improve.

## 2023-12-15 NOTE — Patient Instructions (Addendum)
Look for urea 40% cream or ointment and apply to the thickened dry skin / calluses. This can be bought over the counter, at a pharmacy or online such as [employer].    More silicone pads can be purchased from:  [email or web address]

## 2023-12-28 ENCOUNTER — Other Ambulatory Visit: Payer: Self-pay | Admitting: *Deleted

## 2023-12-28 DIAGNOSIS — E559 Vitamin D deficiency, unspecified: Secondary | ICD-10-CM

## 2023-12-28 DIAGNOSIS — E1142 Type 2 diabetes mellitus with diabetic polyneuropathy: Secondary | ICD-10-CM

## 2023-12-28 DIAGNOSIS — D649 Anemia, unspecified: Secondary | ICD-10-CM

## 2023-12-28 DIAGNOSIS — E039 Hypothyroidism, unspecified: Secondary | ICD-10-CM

## 2023-12-28 DIAGNOSIS — N390 Urinary tract infection, site not specified: Secondary | ICD-10-CM

## 2023-12-28 NOTE — Progress Notes (Signed)
 Faxed request from Nella Bame, NP with Dr. Rayma Calandra for labs to be collected on 2/18 and faxed to [phone]

## 2024-01-05 ENCOUNTER — Inpatient Hospital Stay: Payer: Medicare HMO | Attending: Nurse Practitioner | Admitting: Oncology

## 2024-01-05 ENCOUNTER — Inpatient Hospital Stay: Payer: Medicare HMO

## 2024-01-05 ENCOUNTER — Other Ambulatory Visit: Payer: Medicare HMO

## 2024-01-05 VITALS — BP 129/58 | HR 71 | Temp 98.1°F | Resp 18 | Ht 60.0 in | Wt 155.6 lb

## 2024-01-05 DIAGNOSIS — E1142 Type 2 diabetes mellitus with diabetic polyneuropathy: Secondary | ICD-10-CM

## 2024-01-05 DIAGNOSIS — E559 Vitamin D deficiency, unspecified: Secondary | ICD-10-CM | POA: Insufficient documentation

## 2024-01-05 DIAGNOSIS — Z8541 Personal history of malignant neoplasm of cervix uteri: Secondary | ICD-10-CM | POA: Insufficient documentation

## 2024-01-05 DIAGNOSIS — Z85048 Personal history of other malignant neoplasm of rectum, rectosigmoid junction, and anus: Secondary | ICD-10-CM | POA: Insufficient documentation

## 2024-01-05 DIAGNOSIS — C20 Malignant neoplasm of rectum: Secondary | ICD-10-CM | POA: Diagnosis not present

## 2024-01-05 DIAGNOSIS — D649 Anemia, unspecified: Secondary | ICD-10-CM

## 2024-01-05 DIAGNOSIS — E119 Type 2 diabetes mellitus without complications: Secondary | ICD-10-CM | POA: Diagnosis not present

## 2024-01-05 DIAGNOSIS — E0590 Thyrotoxicosis, unspecified without thyrotoxic crisis or storm: Secondary | ICD-10-CM | POA: Diagnosis not present

## 2024-01-05 DIAGNOSIS — K922 Gastrointestinal hemorrhage, unspecified: Secondary | ICD-10-CM | POA: Insufficient documentation

## 2024-01-05 DIAGNOSIS — M899 Disorder of bone, unspecified: Secondary | ICD-10-CM | POA: Diagnosis not present

## 2024-01-05 DIAGNOSIS — E039 Hypothyroidism, unspecified: Secondary | ICD-10-CM

## 2024-01-05 DIAGNOSIS — N390 Urinary tract infection, site not specified: Secondary | ICD-10-CM

## 2024-01-05 LAB — HEMOGLOBIN A1C
Hgb A1c MFr Bld: 6.1 % — ABNORMAL HIGH (ref 4.8–5.6)
Mean Plasma Glucose: 128.37 mg/dL

## 2024-01-05 LAB — CBC WITH DIFFERENTIAL (CANCER CENTER ONLY)
Abs Immature Granulocytes: 0.01 10*3/uL (ref 0.00–0.07)
Basophils Absolute: 0 10*3/uL (ref 0.0–0.1)
Basophils Relative: 1 %
Eosinophils Absolute: 0.2 10*3/uL (ref 0.0–0.5)
Eosinophils Relative: 2 %
HCT: 30.6 % — ABNORMAL LOW (ref 36.0–46.0)
Hemoglobin: 9.1 g/dL — ABNORMAL LOW (ref 12.0–15.0)
Immature Granulocytes: 0 %
Lymphocytes Relative: 11 %
Lymphs Abs: 0.7 10*3/uL (ref 0.7–4.0)
MCH: 24.8 pg — ABNORMAL LOW (ref 26.0–34.0)
MCHC: 29.7 g/dL — ABNORMAL LOW (ref 30.0–36.0)
MCV: 83.4 fL (ref 80.0–100.0)
Monocytes Absolute: 0.7 10*3/uL (ref 0.1–1.0)
Monocytes Relative: 10 %
Neutro Abs: 5.1 10*3/uL (ref 1.7–7.7)
Neutrophils Relative %: 76 %
Platelet Count: 175 10*3/uL (ref 150–400)
RBC: 3.67 MIL/uL — ABNORMAL LOW (ref 3.87–5.11)
RDW: 14.6 % (ref 11.5–15.5)
WBC Count: 6.6 10*3/uL (ref 4.0–10.5)
nRBC: 0 % (ref 0.0–0.2)

## 2024-01-05 LAB — CMP (CANCER CENTER ONLY)
ALT: 8 U/L (ref 0–44)
AST: 13 U/L — ABNORMAL LOW (ref 15–41)
Albumin: 3.4 g/dL — ABNORMAL LOW (ref 3.5–5.0)
Alkaline Phosphatase: 111 U/L (ref 38–126)
Anion gap: 9 (ref 5–15)
BUN: 21 mg/dL (ref 8–23)
CO2: 27 mmol/L (ref 22–32)
Calcium: 8.3 mg/dL — ABNORMAL LOW (ref 8.9–10.3)
Chloride: 107 mmol/L (ref 98–111)
Creatinine: 0.83 mg/dL (ref 0.44–1.00)
GFR, Estimated: >60 mL/min (ref >60)
Glucose, Bld: 95 mg/dL (ref 70–99)
Potassium: 3.4 mmol/L — ABNORMAL LOW (ref 3.5–5.1)
Sodium: 143 mmol/L (ref 135–145)
Total Bilirubin: 0.5 mg/dL (ref 0.0–1.2)
Total Protein: 6.3 g/dL — ABNORMAL LOW (ref 6.5–8.1)

## 2024-01-05 LAB — VITAMIN B12: Vitamin B-12: 1923 pg/mL — ABNORMAL HIGH (ref 180–914)

## 2024-01-05 LAB — MAGNESIUM: Magnesium: 1.3 mg/dL — ABNORMAL LOW (ref 1.7–2.4)

## 2024-01-05 LAB — FERRITIN: Ferritin: 25 ng/mL (ref 11–307)

## 2024-01-05 LAB — SAMPLE TO BLOOD BANK

## 2024-01-05 LAB — FOLATE: Folate: 15.6 ng/mL (ref >5.9)

## 2024-01-05 LAB — VITAMIN D 25 HYDROXY (VIT D DEFICIENCY, FRACTURES): Vit D, 25-Hydroxy: 28.67 ng/mL — ABNORMAL LOW (ref 30–100)

## 2024-01-05 LAB — IRON AND TIBC
Iron: 21 ug/dL — ABNORMAL LOW (ref 28–170)
Saturation Ratios: 8 % — ABNORMAL LOW (ref 10.4–31.8)
TIBC: 274 ug/dL (ref 250–450)
UIBC: 253 ug/dL

## 2024-01-05 LAB — TSH: TSH: 2.796 u[IU]/mL (ref 0.350–4.500)

## 2024-01-05 NOTE — Progress Notes (Signed)
[facility] OFFICE PROGRESS NOTE   Diagnosis: Rectal cancer  INTERVAL HISTORY:   [name] returns as scheduled.  She feels well.  She reports decreased motion at the left toes.  She has a loose nail at the left great toe.  Objective:  Vital signs in last 24 hours:  Blood pressure (!) 129/58, pulse 71, temperature 98.1 F (36.7 C), temperature source Temporal, resp. rate 18, height 5' (1.524 m), weight 155 lb 9.6 oz (70.6 kg), SpO2 98%.    Lymphatics: No cervical, supraclavicular, axillary, or inguinal nodes Resp: Lungs clear bilaterally Cardio: Regular rate and rhythm GI: No hepatosplenomegaly, left lower quadrant colostomy Vascular: Trace edema at the right greater than left lower leg with chronic stasis change  Skin: Red discoloration at the distal left foot, hypertrophied and raised left great toenail  Portacath/PICC-without erythema  Lab Results:  Lab Results  Component Value Date   WBC 6.6 01/05/2024   HGB 9.1 (L) 01/05/2024   HCT 30.6 (L) 01/05/2024   MCV 83.4 01/05/2024   PLT 175 01/05/2024   NEUTROABS 5.1 01/05/2024    CMP  Lab Results  Component Value Date   NA 141 11/24/2023   K 3.7 11/24/2023   CL 104 11/24/2023   CO2 27 11/24/2023   GLUCOSE 163 (H) 11/24/2023   BUN 17 11/24/2023   CREATININE 0.69 11/24/2023   CALCIUM 7.8 (L) 11/24/2023   PROT 6.0 (L) 11/24/2023   ALBUMIN 3.4 (L) 11/24/2023   AST 13 (L) 11/24/2023   ALT 8 11/24/2023   ALKPHOS 89 11/24/2023   BILITOT 0.5 11/24/2023   GFRNONAA >60 11/24/2023   GFRAA >60 03/29/2020    Lab Results  Component Value Date   CEA1 28.60 (H) 06/12/2021   CEA 44.01 (H) 02/02/2023    Medications: I have reviewed the patient's current medications.   Assessment/Plan: Rectal cancer Partially obstructing mass beginning at 10 cm from the anal verge on colonoscopy 01/23/2021, biopsy-adenocarcinoma, immunohistochemical stains at [facility]-CK20, CDX-2, and SATB2 positive, patchy strong staining  for p16.  The rectal tumor has a distinct immunohistochemical profile suggesting a primary colorectal tumor as opposed to metastatic cervical cancer. CT abdomen/pelvis 01/18/2021-bilateral hydronephrosis, endometrial fluid collection, prominent stool in the colon MRI pelvis 01/23/2021-tumor at 7.6 cm from the anal verge, abnormal signal bridge in the cervix, mesorectum, and anterior aspect of the rectum-similar to MRI from 2020, MRI stage could not be defined secondary to posttreatment changes in the pelvis 03/18/2021-total abdominal hysterectomy, cystoscopy with insertion of ureteral stents, exploratory laparotomy, aborted low anterior resection, descending loop colostomy--right and left fallopian tubes and ovaries negative for malignancy; excision portion of cervix positive for adenocarcinoma; uterus with invasive moderately differentiated adenocarcinoma of the cervix, HPV associated, invading through full-thickness of the cervix/lower uterine segment and into parametrial tissue, margins of resection received disrupted, cannot be evaluated; remaining uterus with extensive endocervical adenocarcinoma in situ involving lower uterine segment and replacing endometrium.  This carcinoma was felt to be a separate primary from the rectal tumor. PET scan at [facility] 04/11/2021-intense FDG activity in region of known rectal mass.  Hypermetabolic left external iliac and right inguinal lymph nodes.  Minimal FDG uptake and small right supraclavicular lymph node.  No uptake seen in the cervix. 04/23/2021 FNA right inguinal lymph node-negative for malignancy Evaluated by Dr. Lattie Corns 05/21/2021, 05/28/2021-appears to have 2 synchronous malignancies (cervical adenocarcinoma and rectal adenocarcinoma); further surgery which would entail pelvic exenteration not recommended by gynecologic oncology or colorectal surgery.  Full doses of radiotherapy  not felt to be feasible.  Recommended 3 months of CAPOX and then repeat MRI of abdomen/pelvis,  chest CT and colonoscopy. Cycle 1 Xeloda 06/13/2021 Cycle 1 FOLFOX 07/08/2021, oxaliplatin dose reduced secondary to pre-existing neuropathy Cycle 2 FOLFOX 07/29/2021, Udenyca Cycle 3 FOLFOX 08/12/2021, Udenyca Cycle 4 FOLFOX 09/02/2021, Udenyca Cycle 5 FOLFOX 09/17/2021, Udenyca CTs at [facility] 10/01/2021-unchanged findings of rectal malignancy status posttreatment.  Marked thickening of the proximal duodenal wall.  Unchanged bilateral inguinal lymph nodes which are not enlarged but avid on prior PET.  Unchanged diffuse bladder wall thickening and perivesicular stranding which may represent radiation cystitis.  New mild right lower lobe bronchial wall thickening, mucus impaction and tree-in-bud pulmonary nodules. Cycle 6 FOLFOX 10/22/2021, Udenyca CT 01/21/2022-stable soft tissue thickening at the lower rectum, slight increase in adjacent stranding no evidence of metastatic disease in the chest, abdomen, or pelvis, interval diverting loop colostomy CT pelvis and lower extremities 04/22/2023-subcutaneous fat stranding in the lower legs bilaterally on the right greater than left, enlarged right inguinal lymph nodes, total left hip arthroplasty with stable acetabular bone erosions, ill-defined soft tissue density in the region of the distal rectum with surrounding fat stranding unchanged     Bilateral hydronephrosis-secondary to urinary retention? Hematometra found on pelvic MRI 01/23/2021, evaluated by gynecologic oncology Cervical cancer November 1994, stage Ib, treated with external beam radiation and brachytherapy at [facility] Diabetes Neuropathy Chronic pain secondary to #6 Recurrent urinary tract infections History of a CVA History of hyperthyroidism G3, P3 COVID-19 infection January 2021 Admission 08/25/2021 with a urinary tract infection-culture negative Anemia secondary to chronic disease, chemotherapy, and rectal bleeding-1 unit packed red blood cells 08/26/2021 Hospital admission 09/23/2021-symptomatic  anemia 16.  Admission 10/08/2021 with altered mental status, nausea/vomiting, and left foot ulcers Urine culture 10/08/2021 positive for Pseudomonas aeruginosa EGD 10/12/2021-gastric and duodenal ulcers-not bleeding 17.  Admission 11/29/2021 through 12/02/2021 with COVID-pneumonia 18.  Right lower extremity cellulitis 04/22/2023-Bactrim, repeat course of Bactrim 05/07/2023 19.  Admission 09/10/2023 with severe symptomatic anemia, dark stool-Hemoccult positive 3 units packed red blood cells Upper endoscopy 09/11/2023: Duodenal ulcers with strictures likely source of GI bleeding, no active bleeding.       Disposition: [name] appears stable.  There is no clinical evidence for progression of rectal or cervical cancer.  She has chronic anemia secondary to chronic disease and chronic GI bleeding.  She will see a podiatrist to evaluate the left foot.  I suspect she has peripheral vascular disease.  [name] has chronic hypomagnesemia.  She declined intravenous magnesium today.  She will return for IV magnesium supplementation next week.  [name] will return for an office and lab visit in 7 weeks.  Thornton Papas, MD  01/05/2024  10:45 AM

## 2024-01-07 ENCOUNTER — Other Ambulatory Visit: Payer: Self-pay | Admitting: *Deleted

## 2024-01-07 DIAGNOSIS — R21 Rash and other nonspecific skin eruption: Secondary | ICD-10-CM

## 2024-01-07 MED ORDER — NYSTATIN 100000 UNIT/GM EX POWD: 1.0000 | Freq: Two times a day (BID) | CUTANEOUS | 1 refills | Status: DC

## 2024-01-07 NOTE — Telephone Encounter (Signed)
Refilled Nystatin powder per Dr. Truett Perna and routed all lab results of 01/05/24 to Dr. Duanne Guess.

## 2024-01-11 ENCOUNTER — Other Ambulatory Visit: Payer: Self-pay | Admitting: *Deleted

## 2024-01-11 DIAGNOSIS — D649 Anemia, unspecified: Secondary | ICD-10-CM

## 2024-01-11 DIAGNOSIS — E1142 Type 2 diabetes mellitus with diabetic polyneuropathy: Secondary | ICD-10-CM

## 2024-01-11 NOTE — Progress Notes (Signed)
 Received orders faxed from Dr. Janne Napoleon office for repeat labs during her April visit.

## 2024-01-12 ENCOUNTER — Other Ambulatory Visit: Payer: Self-pay | Admitting: *Deleted

## 2024-01-12 ENCOUNTER — Inpatient Hospital Stay: Payer: Medicare HMO

## 2024-01-12 VITALS — BP 149/46 | HR 62 | Temp 98.2°F | Resp 16

## 2024-01-12 DIAGNOSIS — Z95828 Presence of other vascular implants and grafts: Secondary | ICD-10-CM

## 2024-01-12 DIAGNOSIS — C20 Malignant neoplasm of rectum: Secondary | ICD-10-CM

## 2024-01-12 MED ORDER — SODIUM CHLORIDE 0.9% FLUSH
10.0000 mL | INTRAVENOUS | Status: DC | PRN
  Administered 2024-01-12: 10 mL via INTRAVENOUS

## 2024-01-12 MED ORDER — SODIUM CHLORIDE 0.9 % IV SOLN: INTRAVENOUS | Status: DC

## 2024-01-12 MED ORDER — MAGNESIUM SULFATE 4 GM/100ML IV SOLN
4.0000 g | Freq: Once | INTRAVENOUS | Status: AC
  Administered 2024-01-12: 4 g via INTRAVENOUS
  Filled 2024-01-12: qty 100

## 2024-01-12 MED ORDER — LIDOCAINE-PRILOCAINE 2.5-2.5 % EX CREA: 1.0000 | TOPICAL_CREAM | CUTANEOUS | 2 refills | Status: DC

## 2024-01-12 MED ORDER — HEPARIN SOD (PORK) LOCK FLUSH 100 UNIT/ML IV SOLN
500.0000 [IU] | Freq: Once | INTRAVENOUS | Status: AC
  Administered 2024-01-12: 500 [IU] via INTRAVENOUS

## 2024-01-12 NOTE — Patient Instructions (Signed)
 [facility] - A DEPT OF [facility]  Discharge Instructions: Thank you for choosing [facility] to provide your oncology and hematology care.   If you have a lab appointment with the Cancer Center, please go directly to the Cancer Center and check in at the registration area.   Wear comfortable clothing and clothing appropriate for easy access to any Portacath or PICC line.   We strive to give you quality time with your provider. You may need to reschedule your appointment if you arrive late (15 or more minutes).  Arriving late affects you and other patients whose appointments are after yours.  Also, if you miss three or more appointments without notifying the office, you may be dismissed from the clinic at the provider's discretion.      For prescription refill requests, have your pharmacy contact our office and allow 72 hours for refills to be completed.    Today you received the following Magnesium IV.   Magnesium Sulfate Injection What is this medication? MAGNESIUM SULFATE (mag NEE zee um SUL fate) prevents and treats low levels of magnesium in your body. It may also be used to prevent and treat seizures during pregnancy in people with high blood pressure disorders, such as preeclampsia or eclampsia. Magnesium plays an important role in maintaining the health of your muscles and nervous system. This medicine may be used for other purposes; ask your health care provider or pharmacist if you have questions. What should I tell my care team before I take this medication? They need to know if you have any of these conditions: Heart disease History of irregular heart beat Kidney disease An unusual or allergic reaction to magnesium sulfate, medications, foods, dyes, or preservatives Pregnant or trying to get pregnant Breast-feeding How should I use this medication? This medication is for infusion into a vein. It is given in a hospital or clinic  setting. Talk to your care team about the use of this medication in children. While this medication may be prescribed for selected conditions, precautions do apply. Overdosage: If you think you have taken too much of this medicine contact a poison control center or emergency room at once. NOTE: This medicine is only for you. Do not share this medicine with others. What if I miss a dose? This does not apply. What may interact with this medication? Certain medications for anxiety or sleep Certain medications for seizures, such phenobarbital Digoxin Medications that relax muscles for surgery Narcotic medications for pain This list may not describe all possible interactions. Give your health care provider a list of all the medicines, herbs, non-prescription drugs, or dietary supplements you use. Also tell them if you smoke, drink alcohol, or use illegal drugs. Some items may interact with your medicine. What should I watch for while using this medication? Your condition will be monitored carefully while you are receiving this medication. You may need blood work done while you are receiving this medication. What side effects may I notice from receiving this medication? Side effects that you should report to your care team as soon as possible: Allergic reactions--skin rash, itching, hives, swelling of the face, lips, tongue, or throat High magnesium level--confusion, drowsiness, facial flushing, redness, sweating, muscle weakness, fast or irregular heartbeat, trouble breathing Low blood pressure--dizziness, feeling faint or lightheaded, blurry vision Side effects that usually do not require medical attention (report to your care team if they continue or are bothersome): Headache Nausea This list may not  describe all possible side effects. Call your doctor for medical advice about side effects. You may report side effects to FDA at 1-800-FDA-1088. Where should I keep my medication? This medication  is given in a hospital or clinic and will not be stored at home. NOTE: This sheet is a summary. It may not cover all possible information. If you have questions about this medicine, talk to your doctor, pharmacist, or health care provider.  2024 Elsevier/Gold Standard (2021-07-17 00:00:00)   To help prevent nausea and vomiting after your treatment, we encourage you to take your nausea medication as directed.  BELOW ARE SYMPTOMS THAT SHOULD BE REPORTED IMMEDIATELY: *FEVER GREATER THAN 100.4 F (38 C) OR HIGHER *CHILLS OR SWEATING *NAUSEA AND VOMITING THAT IS NOT CONTROLLED WITH YOUR NAUSEA MEDICATION *UNUSUAL SHORTNESS OF BREATH *UNUSUAL BRUISING OR BLEEDING *URINARY PROBLEMS (pain or burning when urinating, or frequent urination) *BOWEL PROBLEMS (unusual diarrhea, constipation, pain near the anus) TENDERNESS IN MOUTH AND THROAT WITH OR WITHOUT PRESENCE OF ULCERS (sore throat, sores in mouth, or a toothache) UNUSUAL RASH, SWELLING OR PAIN  UNUSUAL VAGINAL DISCHARGE OR ITCHING   Items with * indicate a potential emergency and should be followed up as soon as possible or go to the Emergency Department if any problems should occur.  Please show the CHEMOTHERAPY ALERT CARD or IMMUNOTHERAPY ALERT CARD at check-in to the Emergency Department and triage nurse.  Should you have questions after your visit or need to cancel or reschedule your appointment, please contact [facility] - A DEPT OF [facility]  Dept: [phone]  and follow the prompts.  Office hours are 8:00 a.m. to 4:30 p.m. Monday - Friday. Please note that voicemails left after 4:00 p.m. may not be returned until the following business day.  We are closed weekends and major holidays. You have access to a nurse at all times for urgent questions. Please call the main number to the clinic Dept: [phone] and follow the prompts.   For any non-urgent questions, you may also contact your provider using  MyChart. We now offer e-Visits for anyone 110 and older to request care online for non-urgent symptoms. For details visit [email or web address].   Also download the MyChart app! Go to the app store, search "MyChart", open the app, select [place], and log in with your MyChart username and password.

## 2024-01-12 NOTE — Progress Notes (Signed)
 Patient tolerated treatment well with no complaints voiced.  Patient left ambulatory in stable condition.  Vital signs stable at discharge.  Follow up as scheduled.

## 2024-01-13 ENCOUNTER — Other Ambulatory Visit: Payer: Self-pay | Admitting: Podiatry

## 2024-02-01 ENCOUNTER — Encounter: Payer: Self-pay | Admitting: Nurse Practitioner

## 2024-02-08 ENCOUNTER — Other Ambulatory Visit: Payer: Self-pay | Admitting: Podiatry

## 2024-02-10 ENCOUNTER — Encounter: Payer: Self-pay | Admitting: Podiatry

## 2024-02-10 ENCOUNTER — Ambulatory Visit (INDEPENDENT_AMBULATORY_CARE_PROVIDER_SITE_OTHER): Payer: Medicare HMO | Admitting: Podiatry

## 2024-02-10 VITALS — Ht 60.0 in | Wt 155.6 lb

## 2024-02-10 DIAGNOSIS — I739 Peripheral vascular disease, unspecified: Secondary | ICD-10-CM

## 2024-02-10 DIAGNOSIS — B351 Tinea unguium: Secondary | ICD-10-CM

## 2024-02-10 DIAGNOSIS — E1142 Type 2 diabetes mellitus with diabetic polyneuropathy: Secondary | ICD-10-CM | POA: Diagnosis not present

## 2024-02-10 DIAGNOSIS — L84 Corns and callosities: Secondary | ICD-10-CM | POA: Diagnosis not present

## 2024-02-10 DIAGNOSIS — M79675 Pain in left toe(s): Secondary | ICD-10-CM

## 2024-02-10 DIAGNOSIS — M79674 Pain in right toe(s): Secondary | ICD-10-CM | POA: Diagnosis not present

## 2024-02-14 NOTE — Progress Notes (Signed)
 Subjective:  Patient ID: [name], female    DOB: Dec 23, 1945,  MRN: [id number]  ANAMIKA KUEKER presents to clinic today for at risk footcare. Patient has h/o diabetes, neuropathy and PAD and is seen for  and callus(es) of both feet and painful mycotic toenails that are difficult to trim. Painful toenails interfere with ambulation. Aggravating factors include wearing enclosed shoe gear. Pain is relieved with periodic professional debridement. Painful calluses are aggravated when weightbearing with and without shoegear. Pain is relieved with periodic professional debridement. She is accompanied by her daughter on today's visit. Patient has h/o right leg swelling and DVT was ruled out in October. Chief Complaint  Patient presents with   Nail Problem    Pt is here for [facility] unsure of last A1C PCP is Dr Duanne Guess and LOV was last year sometime.   New problem(s): None.   PCP is Lewis Moccasin, MD.  Allergies  Allergen Reactions   Codeine Nausea Only    Review of Systems: Negative except as noted in the HPI.  Objective: No changes noted in today's physical examination. There were no vitals filed for this visit. AMBRIE CARTE is a pleasant 78 y.o. female in NAD. AAO x 3.  Vascular:  Capillary fill time to digits <3 seconds b/l lower extremities. Pedal pulses diminished RLE. Edema RLE.  Pedal hair absent. Lower extremity skin temperature gradient within normal limits. No pain with calf compression b/l. No ischemia or gangrene noted b/l LE. No cyanosis or clubbing noted b/l LE.  Dermatological:  Skin warm and supple b/l lower extremities. No open wounds b/l lower extremities. No interdigital macerations b/l lower extremities.   Toenails 1-5 b/l elongated, discolored, dystrophic, thickened, crumbly with subungual debris and tenderness to dorsal palpation.   Preulcerative lesion(s) sub IPJ R hallux.  No erythema, no edema, no drainage, no fluctuance.   Preulcerative lesion sub 1  right foot with underlying fluctuance. No surrounding erythema, edema, or drainage. No tracking/tunneling into deep tissues.  She has minimal hyperkeratosis sub hallux IPJ of the left foot.  Musculoskeletal:  Normal muscle strength 5/5 to all lower extremity muscle groups bilaterally. No pain crepitus or joint limitation noted with ROM b/l lower extremities.   Hammertoe(s) noted to the L 2nd toe and R 2nd toe.   Neurological:  Protective sensation diminished with 10g monofilament b/l. Vibratory sensation decreased b/l.  Assessment/Plan: 1. Pain due to onychomycosis of toenails of both feet   2. Callus   3. PAD (peripheral artery disease) (HCC)   4. Diabetic peripheral neuropathy associated with type 2 diabetes mellitus (HCC)      AMB REFERRAL TO VASCULAR SURGERY/CLINIC -Patient was evaluated today. All questions/concerns addressed on today's visit. -Patient's family member present. All questions/concerns addressed on today's visit. -Daughter will reach out to Oncology regarding orders to rule out DVT again. -Continue foot and shoe inspections daily. Monitor blood glucose per PCP/Endocrinologist's recommendations. -Patient to continue soft, supportive shoe gear daily. -Toenails 1-5 b/l were debrided in length and girth with sterile nail nippers and dremel without iatrogenic bleeding.  -Callus(es) R hallux, submet head 1 left foot, and submet head 1 right foot pared utilizing sterile scalpel blade without complication or incident. Total number debrided =3. -Vascular Surgery consultation ordered today for evaluation/treatment of diminished pulses/swelling RLE. -Patient/POA to call should there be question/concern in the interim.   Return in about 3 months (around 05/12/2024).  Freddie Breech, DPM      [place] LOCATION: [address]  [place], [place] [address]                   Office [phone]   [facility] LOCATION: [address] [place], [place] [address] Office [phone]

## 2024-02-15 ENCOUNTER — Other Ambulatory Visit: Payer: Self-pay | Admitting: *Deleted

## 2024-02-15 DIAGNOSIS — I739 Peripheral vascular disease, unspecified: Secondary | ICD-10-CM

## 2024-02-16 ENCOUNTER — Ambulatory Visit (HOSPITAL_COMMUNITY)
Admission: RE | Admit: 2024-02-16 | Discharge: 2024-02-16 | Disposition: A | Discharge status: Home / Self Care | Source: Ambulatory Visit | Attending: Surgery | Admitting: Surgery

## 2024-02-16 ENCOUNTER — Other Ambulatory Visit: Payer: Self-pay | Admitting: *Deleted

## 2024-02-16 DIAGNOSIS — D649 Anemia, unspecified: Secondary | ICD-10-CM

## 2024-02-16 DIAGNOSIS — I739 Peripheral vascular disease, unspecified: Secondary | ICD-10-CM | POA: Insufficient documentation

## 2024-02-16 LAB — VAS US ABI WITH/WO TBI
Left ABI: 1.01
Right ABI: 0.85

## 2024-02-16 NOTE — Progress Notes (Signed)
 Received request form Dow Adolph, FNP requesting labs be collected for their office when she is here on 4/7 for [facility] labs. Tests added and results will be sent to PCP when available.

## 2024-02-19 ENCOUNTER — Encounter: Payer: Self-pay | Admitting: Vascular Surgery

## 2024-02-19 ENCOUNTER — Ambulatory Visit: Admitting: Vascular Surgery

## 2024-02-19 VITALS — BP 126/51 | HR 68 | Temp 98.3°F | Resp 20 | Ht 60.0 in | Wt 150.0 lb

## 2024-02-19 DIAGNOSIS — R600 Localized edema: Secondary | ICD-10-CM | POA: Diagnosis not present

## 2024-02-19 DIAGNOSIS — M7989 Other specified soft tissue disorders: Secondary | ICD-10-CM

## 2024-02-19 DIAGNOSIS — I739 Peripheral vascular disease, unspecified: Secondary | ICD-10-CM

## 2024-02-19 NOTE — Progress Notes (Signed)
 Patient name: [name] MRN: [id number] DOB: Dec 28, 1945 Sex: female  REASON FOR CONSULT: Right lower extremity pain and swelling  HPI: [name] is a 78 y.o. female, with history diabetes, hyperlipidemia, basal ganglia stroke, partially obstructing rectal cancer s/p colostomy that presents for evaluation of right leg pain and swelling.  This has been ongoing for months.  She has had multiple DVT studies that have been negative.  Also has a new ulcer on the back of her left heel.  She is followed by podiatry.  She is ambulatory.  She is here with her daughter.  ABIs 02/16/2024 are 0.85 on the right biphasic and 1.01 on the left biphasic  Past Medical History:  Diagnosis Date   Arthritis    Basal ganglia stroke [facility])    March 2022   Bilateral lower extremity edema    Cancer (HCC)    twice- colon cancer, unsure of the other type of cancer   Carotid artery occlusion    Flank pain    Full dentures    History of cervical cancer    11/ 1994  Stage IB  s/p  high dose radiation brachytherapy @ [facility] 01/ 1995--- per pt no recurrence   History of chronic bronchitis    History of hyperthyroidism    d 03/ 2011---due to grave's disease--- s/p  RAI treatment 04/ 2011   History of kidney stones    History of sepsis 05/03/2018   due to UTI with klebsiella/ pyelonephritis/ ureteral obstruction cause by stone   Hyperlipidemia    Hypothyroidism, postradioiodine therapy    endocrinologist--  dr Everardo All--  dx graves disease and s/p RAI i131 treatement 4/ 2011   Nauseated    Oral thrush 05/03/2018   Pneumonia    Type 2 diabetes mellitus treated with insulin (HCC)    FOLLOWED BY PCP   Urgency of urination    Wears glasses     Past Surgical History:  Procedure Laterality Date   APPENDECTOMY     BIOPSY  01/23/2021   Procedure: BIOPSY;  Surgeon: Napoleon Form, MD;  Location: [facility];  Service: Endoscopy;;   BIOPSY  10/12/2021   Procedure: BIOPSY;  Surgeon: Tressia Danas, MD;  Location: [facility];  Service: Gastroenterology;;   BIOPSY  09/11/2023   Procedure: BIOPSY;  Surgeon: Lynann Bologna, MD;  Location: [facility];  Service: Gastroenterology;;   BIOPSY  10/13/2023   Procedure: BIOPSY;  Surgeon: Imogene Burn, MD;  Location: [facility];  Service: Gastroenterology;;   CAROTID ENDARTERECTOMY Right 03/28/2020   CATARACT EXTRACTION W/ INTRAOCULAR LENS  IMPLANT, BILATERAL  2004  approx.   COLONOSCOPY     COLONOSCOPY WITH PROPOFOL N/A 01/23/2021   Procedure: COLONOSCOPY WITH PROPOFOL;  Surgeon: Napoleon Form, MD;  Location: [facility];  Service: Endoscopy;  Laterality: N/A;   CYSTOSCOPY WITH STENT PLACEMENT Right 05/04/2018   Procedure: CYSTOSCOPY WITH STENT PLACEMENT AND RETROGRADE PYELOGRAM;  Surgeon: Rene Paci, MD;  Location: [facility];  Service: Urology;  Laterality: Right;   CYSTOSCOPY/URETEROSCOPY/HOLMIUM LASER/STENT PLACEMENT Right 05/19/2018   Procedure: CYSTOSCOPY, URETEROSCOPY/HOLMIUM LASER, STONE BASKETRY/ STENT EXCHANGE;  Surgeon: Rene Paci, MD;  Location: [facility];  Service: Urology;  Laterality: Right;   ENDARTERECTOMY Right 03/28/2020   Procedure: RIGHT CAROTID ENDARTERECTOMY with PATCH ANGIOPLASTY;  Surgeon: Larina Earthly, MD;  Location: [facility] OR;  Service: Vascular;  Laterality: Right;   ENTEROSCOPY N/A 10/13/2023   Procedure: ENTEROSCOPY;  Surgeon: Imogene Burn, MD;  [facility]: [facility];  Service: Gastroenterology;  Laterality: N/A;   ESOPHAGOGASTRODUODENOSCOPY (EGD) WITH PROPOFOL N/A 10/12/2021   Procedure: ESOPHAGOGASTRODUODENOSCOPY (EGD) WITH PROPOFOL;  Surgeon: Tressia Danas, MD;  [facility];  Service: Gastroenterology;  Laterality: N/A;   ESOPHAGOGASTRODUODENOSCOPY (EGD) WITH PROPOFOL N/A 09/11/2023   Procedure: ESOPHAGOGASTRODUODENOSCOPY (EGD) WITH PROPOFOL;  Surgeon: Lynann Bologna, MD;  Location: [facility];  Service: Gastroenterology;  Laterality:  N/A;   EXCISION MASS LEFT CHEST WALL  09-20-2007   dr Janee Morn  [facility]   HOT HEMOSTASIS  10/13/2023   Procedure: HOT HEMOSTASIS (ARGON PLASMA COAGULATION/BICAP);  Surgeon: Imogene Burn, MD;  Location: [facility];  Service: Gastroenterology;;   Radioactive Iodine Therapy     for thyroid   REVISION TOTAL HIP ARTHROPLASTY Left early 2000s   SUBMUCOSAL TATTOO INJECTION  01/23/2021   Procedure: SUBMUCOSAL TATTOO INJECTION;  Surgeon: Napoleon Form, MD;  Location: [facility];  Service: Endoscopy;;   TANDEM RING INSERTION  1995   dr Loree Fee @ [facility]   EUA w/ tandem placement in ovid  (for direct high dose radiation brachytherapy , cervical cancer)   TOTAL HIP ARTHROPLASTY Left 1990s   TRANSTHORACIC ECHOCARDIOGRAM  08/07/2017   mild focal basal hypertrophy of the septum,  ef 60-65%, grade 1 diastolic dysfunction/  atrial septum with lipomatous hypertrophy/  trivial PR   UPPER GASTROINTESTINAL ENDOSCOPY      Family History  Problem Relation Age of Onset   Emphysema Father    Diabetes Father    Emphysema Sister    Emphysema Brother    Cancer Mother        Skin Cancer   Diabetes Mother    Colon cancer Neg Hx    Esophageal cancer Neg Hx    Rectal cancer Neg Hx    Stomach cancer Neg Hx     SOCIAL HISTORY: Social History   Socioeconomic History   Marital status: Married    Spouse name: Not on file   Number of children: 2   Years of education: Not on file   Highest education level: Not on file  Occupational History   Occupation: Disabled  Tobacco Use   Smoking status: Former    Current packs/day: 0.00    Types: Cigarettes    Start date: 05/13/1977    Quit date: 05/13/1982    Years since quitting: 41.8    Passive exposure: Past   Smokeless tobacco: Never  Vaping Use   Vaping status: Never Used  Substance and Sexual Activity   Alcohol use: No   Drug use: No   Sexual activity: Not Currently  Other Topics Concern   Not on file  Social History Narrative   ** Merged  History Encounter **       Married and lives with husband Has 2 children  Housewife Disabled 2-3 cups of caffeine  R handed    Social Drivers of Corporate investment banker Strain: Not on file  Food Insecurity: Patient Unable To Answer (10/14/2023)   Hunger Vital Sign    Worried About Running Out of Food in the Last Year: Patient unable to answer    Ran Out of Food in the Last Year: Patient unable to answer  Transportation Needs: Patient Unable To Answer (10/14/2023)   PRAPARE - Transportation    Lack of Transportation (Medical): Patient unable to answer    Lack of Transportation (Non-Medical): Patient unable to answer  Physical Activity: Not on file  Stress: Not on file  Social Connections: Not on file  Intimate Partner Violence: Patient Unable To Answer (10/14/2023)   Humiliation, Afraid, Rape, and Kick questionnaire    Fear of Current or Ex-Partner: Patient unable to answer    Emotionally Abused: Patient unable to answer    Physically Abused: Patient unable to answer    Sexually Abused: Patient unable to answer    Allergies  Allergen Reactions   Codeine Nausea Only    Current Outpatient Medications  Medication Sig Dispense Refill   acetaminophen (TYLENOL) 500 MG tablet Take 500 mg by mouth daily as needed for moderate pain (pain score 4-6) or headache.     atorvastatin (LIPITOR) 40 MG tablet Take 40 mg by mouth at bedtime.     baclofen (LIORESAL) 10 MG tablet Take 1 tablet (10 mg total) by mouth 2 (two) times daily. 30 each 0   clotrimazole-betamethasone (LOTRISONE) cream APPLY  CREAM TOPICALLY ONCE DAILY TO  HEELS. 45 g 0   cyanocobalamin (VITAMIN B12) 1000 MCG tablet Take 1,000 mcg by mouth daily.     doxycycline (VIBRA-TABS) 100 MG tablet Take 1 tablet (100 mg total) by mouth 2 (two) times daily. Take for 7 days 14 tablet 0   gabapentin (NEURONTIN) 300 MG capsule Take 300-600 mg by mouth See admin instructions. Take one capsule (300 mg) by mouth every morning and two  capsules (600 mg) at night     Homeopathic Products (LEG CRAMPS) TABS Take 1 tablet by mouth 2 (two) times daily as needed (leg pain).     levothyroxine (SYNTHROID) 137 MCG tablet Take 137 mcg by mouth daily before breakfast.     lidocaine-prilocaine (EMLA) cream Apply 1 Application topically as directed. Apply 1/2 tablespoon to port site 1-2 hours prior to stick and cover with press-and-seal to numb site 30 g 2   MAGnesium-Oxide 400 (240 Mg) MG tablet Take 2 tablets (800 mg total) by mouth 2 (two) times daily. 120 tablet 0   MELATONIN PO Take 1 tablet by mouth at bedtime.     metFORMIN (GLUCOPHAGE) 500 MG tablet Take 500 mg by mouth 2 (two) times daily with a meal.     nystatin (MYCOSTATIN/NYSTOP) powder Apply 1 Application topically 2 (two) times daily. To groin rash 15 g 1   oxyCODONE ER (XTAMPZA ER) 9 MG C12A Take 9 mg by mouth in the morning and at bedtime.     pantoprazole (PROTONIX) 40 MG tablet Take 1 tablet (40 mg total) by mouth daily. 90 tablet 0   sertraline (ZOLOFT) 50 MG tablet Take 50 mg by mouth at bedtime.     TRESIBA FLEXTOUCH 100 UNIT/ML FlexTouch Pen Inject 40 Units into the skin at bedtime.     No current facility-administered medications for this visit.    REVIEW OF SYSTEMS:  [X]  denotes positive finding, [ ]  denotes negative finding Cardiac  Comments:  Chest pain or chest pressure:    Shortness of breath upon exertion:    Short of breath when lying flat:    Irregular heart rhythm:        Vascular    Pain in calf, thigh, or hip brought on by ambulation:    Pain in feet at night that wakes you up from your sleep:     Blood clot in your veins:    Leg swelling:  x Right      Pulmonary    Oxygen at home:    Productive cough:     Wheezing:         Neurologic  Sudden weakness in arms or legs:     Sudden numbness in arms or legs:     Sudden onset of difficulty speaking or slurred speech:    Temporary loss of vision in one eye:     Problems with dizziness:          Gastrointestinal    Blood in stool:     Vomited blood:         Genitourinary    Burning when urinating:     Blood in urine:        Psychiatric    Major depression:         Hematologic    Bleeding problems:    Problems with blood clotting too easily:        Skin    Rashes or ulcers:        Constitutional    Fever or chills:      PHYSICAL EXAM: There were no vitals filed for this visit.  GENERAL: The patient is a well-nourished female, in no acute distress. The vital signs are documented above. CARDIAC: There is a regular rate and rhythm.  VASCULAR:  Bilateral DP pulses palpable Notable 2+ edema right lower extremity Ulcer on back of left heel as pictured PULMONARY: No respiratory distress. ABDOMEN: Soft and non-tender. MUSCULOSKELETAL: There are no major deformities or cyanosis. NEUROLOGIC: No focal weakness or paresthesias are detected. SKIN: There are no ulcers or rashes noted. PSYCHIATRIC: The patient has a normal affect.       DATA:   ABIs 02/16/2024 are 0.85 on the right biphasic and 1.01 on the left biphasic  Assessment/Plan:  78 y.o. female, with history diabetes, hyperlipidemia, basal ganglia stroke, partially obstructing rectal cancer s/p colostomy that presents for evaluation of right leg pain and swelling.  No evidence of DVT on prior studies.  I discussed her right leg swelling could be related to a number of underlying etiologies including her rectal cancer although she only has some mild right inguinal lymphadenopathy on most recent CT versus chronic venous sufficiency versus lymphedema.  I will have her come back in 1 month with right leg reflux study for further work-up.  At that time we will keep a close eye on her left heel ulcer that is new as pictured.  She does have palpable pedal pulses on the left with a normal ABI.  I did discuss my concern that she has toe pressure of 0 on the left and if she has worsening heel ulcer on the left will need  angiography to further optimize her chances for wound healing.  She is going back to see podiatry next week and again with a palpable pedal pulse at this time we will continue to watch this and see how it looks in one month.   Cephus Shelling, MD [facility] Office: [phone]

## 2024-02-22 ENCOUNTER — Inpatient Hospital Stay: Payer: Medicare HMO | Admitting: Oncology

## 2024-02-22 ENCOUNTER — Inpatient Hospital Stay: Payer: Medicare HMO

## 2024-02-22 ENCOUNTER — Other Ambulatory Visit: Payer: Self-pay

## 2024-02-22 ENCOUNTER — Telehealth: Payer: Self-pay

## 2024-02-22 ENCOUNTER — Other Ambulatory Visit: Payer: Self-pay | Admitting: *Deleted

## 2024-02-22 ENCOUNTER — Ambulatory Visit: Admitting: Podiatrist

## 2024-02-22 ENCOUNTER — Encounter: Payer: Self-pay | Admitting: Podiatrist

## 2024-02-22 ENCOUNTER — Telehealth: Payer: Self-pay | Admitting: *Deleted

## 2024-02-22 ENCOUNTER — Inpatient Hospital Stay: Payer: Medicare HMO | Attending: Nurse Practitioner

## 2024-02-22 VITALS — BP 137/40 | HR 62 | Temp 98.1°F | Resp 18 | Ht 60.0 in | Wt 150.0 lb

## 2024-02-22 DIAGNOSIS — D500 Iron deficiency anemia secondary to blood loss (chronic): Secondary | ICD-10-CM | POA: Diagnosis not present

## 2024-02-22 DIAGNOSIS — Z8541 Personal history of malignant neoplasm of cervix uteri: Secondary | ICD-10-CM | POA: Diagnosis not present

## 2024-02-22 DIAGNOSIS — D638 Anemia in other chronic diseases classified elsewhere: Secondary | ICD-10-CM | POA: Diagnosis not present

## 2024-02-22 DIAGNOSIS — D649 Anemia, unspecified: Secondary | ICD-10-CM

## 2024-02-22 DIAGNOSIS — Z95828 Presence of other vascular implants and grafts: Secondary | ICD-10-CM

## 2024-02-22 DIAGNOSIS — K922 Gastrointestinal hemorrhage, unspecified: Secondary | ICD-10-CM | POA: Insufficient documentation

## 2024-02-22 DIAGNOSIS — R600 Localized edema: Secondary | ICD-10-CM

## 2024-02-22 DIAGNOSIS — C20 Malignant neoplasm of rectum: Secondary | ICD-10-CM

## 2024-02-22 DIAGNOSIS — L89622 Pressure ulcer of left heel, stage 2: Secondary | ICD-10-CM | POA: Diagnosis not present

## 2024-02-22 DIAGNOSIS — Z85048 Personal history of other malignant neoplasm of rectum, rectosigmoid junction, and anus: Secondary | ICD-10-CM | POA: Insufficient documentation

## 2024-02-22 DIAGNOSIS — E11621 Type 2 diabetes mellitus with foot ulcer: Secondary | ICD-10-CM | POA: Insufficient documentation

## 2024-02-22 DIAGNOSIS — E1142 Type 2 diabetes mellitus with diabetic polyneuropathy: Secondary | ICD-10-CM

## 2024-02-22 LAB — CBC WITH DIFFERENTIAL (CANCER CENTER ONLY)
Abs Immature Granulocytes: 0.03 10*3/uL (ref 0.00–0.07)
Basophils Absolute: 0 10*3/uL (ref 0.0–0.1)
Basophils Relative: 1 %
Eosinophils Absolute: 0.1 10*3/uL (ref 0.0–0.5)
Eosinophils Relative: 1 %
HCT: 30.8 % — ABNORMAL LOW (ref 36.0–46.0)
Hemoglobin: 8.8 g/dL — ABNORMAL LOW (ref 12.0–15.0)
Immature Granulocytes: 0 %
Lymphocytes Relative: 11 %
Lymphs Abs: 0.9 10*3/uL (ref 0.7–4.0)
MCH: 23.3 pg — ABNORMAL LOW (ref 26.0–34.0)
MCHC: 28.6 g/dL — ABNORMAL LOW (ref 30.0–36.0)
MCV: 81.5 fL (ref 80.0–100.0)
Monocytes Absolute: 0.5 10*3/uL (ref 0.1–1.0)
Monocytes Relative: 6 %
Neutro Abs: 6.5 10*3/uL (ref 1.7–7.7)
Neutrophils Relative %: 81 %
Platelet Count: 229 10*3/uL (ref 150–400)
RBC: 3.78 MIL/uL — ABNORMAL LOW (ref 3.87–5.11)
RDW: 16.3 % — ABNORMAL HIGH (ref 11.5–15.5)
WBC Count: 8 10*3/uL (ref 4.0–10.5)
nRBC: 0 % (ref 0.0–0.2)

## 2024-02-22 LAB — SAMPLE TO BLOOD BANK

## 2024-02-22 LAB — IRON AND TIBC
Iron: 23 ug/dL — ABNORMAL LOW (ref 28–170)
Saturation Ratios: 10 % — ABNORMAL LOW (ref 10.4–31.8)
TIBC: 230 ug/dL — ABNORMAL LOW (ref 250–450)
UIBC: 207 ug/dL

## 2024-02-22 LAB — CMP (CANCER CENTER ONLY)
ALT: 11 U/L (ref 0–44)
AST: 12 U/L — ABNORMAL LOW (ref 15–41)
Albumin: 3.3 g/dL — ABNORMAL LOW (ref 3.5–5.0)
Alkaline Phosphatase: 107 U/L (ref 38–126)
Anion gap: 11 (ref 5–15)
BUN: 18 mg/dL (ref 8–23)
CO2: 27 mmol/L (ref 22–32)
Calcium: 8.8 mg/dL — ABNORMAL LOW (ref 8.9–10.3)
Chloride: 105 mmol/L (ref 98–111)
Creatinine: 0.89 mg/dL (ref 0.44–1.00)
GFR, Estimated: >60 mL/min (ref >60)
Glucose, Bld: 226 mg/dL — ABNORMAL HIGH (ref 70–99)
Potassium: 3.7 mmol/L (ref 3.5–5.1)
Sodium: 143 mmol/L (ref 135–145)
Total Bilirubin: 0.5 mg/dL (ref 0.0–1.2)
Total Protein: 6.2 g/dL — ABNORMAL LOW (ref 6.5–8.1)

## 2024-02-22 LAB — VITAMIN B12: Vitamin B-12: 588 pg/mL (ref 180–914)

## 2024-02-22 LAB — PROTIME-INR
INR: 1 (ref 0.8–1.2)
Prothrombin Time: 13.8 s (ref 11.4–15.2)

## 2024-02-22 LAB — APTT: aPTT: 35 s (ref 24–36)

## 2024-02-22 LAB — PREPARE RBC (CROSSMATCH)

## 2024-02-22 LAB — FOLATE: Folate: 17.2 ng/mL (ref >5.9)

## 2024-02-22 LAB — FERRITIN: Ferritin: 60 ng/mL (ref 11–307)

## 2024-02-22 LAB — MAGNESIUM: Magnesium: 0.9 mg/dL — CL (ref 1.7–2.4)

## 2024-02-22 LAB — HEMOGLOBIN A1C
Hgb A1c MFr Bld: 6.5 % — ABNORMAL HIGH (ref 4.8–5.6)
Mean Plasma Glucose: 139.85 mg/dL

## 2024-02-22 MED ORDER — SODIUM CHLORIDE 0.9% FLUSH
10.0000 mL | INTRAVENOUS | Status: DC | PRN
  Administered 2024-02-22: 10 mL via INTRAVENOUS

## 2024-02-22 MED ORDER — HEPARIN SOD (PORK) LOCK FLUSH 100 UNIT/ML IV SOLN
500.0000 [IU] | Freq: Once | INTRAVENOUS | Status: AC
  Administered 2024-02-22: 500 [IU] via INTRAVENOUS

## 2024-02-22 NOTE — Telephone Encounter (Signed)
 CRITICAL VALUE STICKER  CRITICAL VALUE:magnesium 0.9  RECEIVER (on-site recipient of call):Daleyza Gadomski  DATE & TIME NOTIFIED: 02/22/2024 11:52  MESSENGER (representative from lab):Garey Ham  MD NOTIFIED:  GBS TIME OF NOTIFICATION:02/22/2024 11:52  RESPONSE:patient is schedule to see GBS

## 2024-02-22 NOTE — Telephone Encounter (Addendum)
 Spoke with daughter to determine if [name] would agree to a nephrology referral to help determine why her magnesium level is always so low. She agrees to the referral and knows [name] will as well. Referral faxed to [facility]. Also routed all labs today to Dr. Duanne Guess.

## 2024-02-22 NOTE — Progress Notes (Signed)
 [name] OFFICE PROGRESS NOTE   Diagnosis: Rectal cancer  INTERVAL HISTORY:   [name] returns as scheduled.  She generally feels well.  She has intermittent rectal bleeding.  The stool is intermittently dark.  She has developed an ulcer at the left heel.  She received IV magnesium on 01/12/2024.  She reports erythema at the labia.  She has urinary incontinence.  Objective:  Vital signs in last 24 hours:  Blood pressure (!) 137/40, pulse 62, temperature 98.1 F (36.7 C), temperature source Temporal, resp. rate 18, height 5' (1.524 m), weight 150 lb (68 kg), SpO2 97%.    HEENT: No thrush Resp: Lungs clear bilaterally Cardio: Regular rate and rhythm GI: No hepatosplenomegaly, left lower quadrant colostomy with light brown stool Vascular: Support stocking in place at the right lower leg, no left leg edema.  Skin: 2-3 cm superficial ulcer at the left heel, 1/2 cm abrasion at the posterior left calf, the left great toenail appears to be healing, moisture and erythema at the external labia  Portacath/PICC-without erythema  Lab Results:  Lab Results  Component Value Date   WBC 8.0 02/22/2024   HGB 8.8 (L) 02/22/2024   HCT 30.8 (L) 02/22/2024   MCV 81.5 02/22/2024   PLT 229 02/22/2024   NEUTROABS 6.5 02/22/2024    CMP  Lab Results  Component Value Date   NA 143 02/22/2024   K 3.7 02/22/2024   CL 105 02/22/2024   CO2 27 02/22/2024   GLUCOSE 226 (H) 02/22/2024   BUN 18 02/22/2024   CREATININE 0.89 02/22/2024   CALCIUM 8.8 (L) 02/22/2024   PROT 6.2 (L) 02/22/2024   ALBUMIN 3.3 (L) 02/22/2024   AST 12 (L) 02/22/2024   ALT 11 02/22/2024   ALKPHOS 107 02/22/2024   BILITOT 0.5 02/22/2024   GFRNONAA >60 02/22/2024   GFRAA >60 03/29/2020    Lab Results  Component Value Date   CEA1 28.60 (H) 06/12/2021   CEA 44.01 (H) 02/02/2023    Medications: I have reviewed the patient's current medications.   Assessment/Plan: Rectal cancer Partially  obstructing mass beginning at 10 cm from the anal verge on colonoscopy 01/23/2021, biopsy-adenocarcinoma, immunohistochemical stains at [facility]-CK20, CDX-2, and SATB2 positive, patchy strong staining for p16.  The rectal tumor has a distinct immunohistochemical profile suggesting a primary colorectal tumor as opposed to metastatic cervical cancer. CT abdomen/pelvis 01/18/2021-bilateral hydronephrosis, endometrial fluid collection, prominent stool in the colon MRI pelvis 01/23/2021-tumor at 7.6 cm from the anal verge, abnormal signal bridge in the cervix, mesorectum, and anterior aspect of the rectum-similar to MRI from 2020, MRI stage could not be defined secondary to posttreatment changes in the pelvis 03/18/2021-total abdominal hysterectomy, cystoscopy with insertion of ureteral stents, exploratory laparotomy, aborted low anterior resection, descending loop colostomy--right and left fallopian tubes and ovaries negative for malignancy; excision portion of cervix positive for adenocarcinoma; uterus with invasive moderately differentiated adenocarcinoma of the cervix, HPV associated, invading through full-thickness of the cervix/lower uterine segment and into parametrial tissue, margins of resection received disrupted, cannot be evaluated; remaining uterus with extensive endocervical adenocarcinoma in situ involving lower uterine segment and replacing endometrium.  This carcinoma was felt to be a separate primary from the rectal tumor. PET scan at [facility] 04/11/2021-intense FDG activity in region of known rectal mass.  Hypermetabolic left external iliac and right inguinal lymph nodes.  Minimal FDG uptake and small right supraclavicular lymph node.  No uptake seen in the cervix. 04/23/2021 FNA right inguinal lymph node-negative for malignancy  Evaluated by Dr. Lattie Corns 05/21/2021, 05/28/2021-appears to have 2 synchronous malignancies (cervical adenocarcinoma and rectal adenocarcinoma); further surgery which would entail pelvic  exenteration not recommended by gynecologic oncology or colorectal surgery.  Full doses of radiotherapy not felt to be feasible.  Recommended 3 months of CAPOX and then repeat MRI of abdomen/pelvis, chest CT and colonoscopy. Cycle 1 Xeloda 06/13/2021 Cycle 1 FOLFOX 07/08/2021, oxaliplatin dose reduced secondary to pre-existing neuropathy Cycle 2 FOLFOX 07/29/2021, Udenyca Cycle 3 FOLFOX 08/12/2021, Udenyca Cycle 4 FOLFOX 09/02/2021, Udenyca Cycle 5 FOLFOX 09/17/2021, Udenyca CTs at [facility] 10/01/2021-unchanged findings of rectal malignancy status posttreatment.  Marked thickening of the proximal duodenal wall.  Unchanged bilateral inguinal lymph nodes which are not enlarged but avid on prior PET.  Unchanged diffuse bladder wall thickening and perivesicular stranding which may represent radiation cystitis.  New mild right lower lobe bronchial wall thickening, mucus impaction and tree-in-bud pulmonary nodules. Cycle 6 FOLFOX 10/22/2021, Udenyca CT 01/21/2022-stable soft tissue thickening at the lower rectum, slight increase in adjacent stranding no evidence of metastatic disease in the chest, abdomen, or pelvis, interval diverting loop colostomy CT pelvis and lower extremities 04/22/2023-subcutaneous fat stranding in the lower legs bilaterally on the right greater than left, enlarged right inguinal lymph nodes, total left hip arthroplasty with stable acetabular bone erosions, ill-defined soft tissue density in the region of the distal rectum with surrounding fat stranding unchanged     Bilateral hydronephrosis-secondary to urinary retention? Hematometra found on pelvic MRI 01/23/2021, evaluated by gynecologic oncology Cervical cancer November 1994, stage Ib, treated with external beam radiation and brachytherapy at [facility] Diabetes Neuropathy Chronic pain secondary to #6 Recurrent urinary tract infections History of a CVA History of hyperthyroidism G3, P3 COVID-19 infection January 2021 Admission 08/25/2021 with  a urinary tract infection-culture negative Anemia secondary to chronic disease, chemotherapy, and rectal bleeding-1 unit packed red blood cells 08/26/2021 Hospital admission 09/23/2021-symptomatic anemia 16.  Admission 10/08/2021 with altered mental status, nausea/vomiting, and left foot ulcers Urine culture 10/08/2021 positive for Pseudomonas aeruginosa EGD 10/12/2021-gastric and duodenal ulcers-not bleeding 17.  Admission 11/29/2021 through 12/02/2021 with COVID-pneumonia 18.  Right lower extremity cellulitis 04/22/2023-Bactrim, repeat course of Bactrim 05/07/2023 19.  Admission 09/10/2023 with severe symptomatic anemia, dark stool-Hemoccult positive 3 units packed red blood cells Upper endoscopy 09/11/2023: Duodenal ulcers with strictures likely source of GI bleeding, no active bleeding.        Disposition: [name] has a history of locally recurrent rectal cancer and cervical cancer.  There is no clinical evidence of disease progression.  She has chronic anemia secondary to chronic disease and chronic GI blood loss.  She would like to receive a Red cell transfusion this week.  She will return for 1 unit of packed red blood cells.  Ms. Hunkele has chronic hypomagnesemia of unclear etiology.  This could be related to a chronic renal issue.  She continues an oral magnesium supplement and she will return for IV magnesium later this week.  We will ask her to consider a nephrology referral.  Ms. Reuter will follow-up with podiatry and vascular surgery for evaluation/management of the left heel ulcer.  She will return for an office and lab visit during the week of 03/28/2024.  Thornton Papas, MD  02/22/2024  12:47 PM

## 2024-02-22 NOTE — Progress Notes (Signed)
 Patients port flushed without difficulty.  Good blood return noted with no bruising or swelling noted at site.Patient has office visit with Dr Truett Perna today.

## 2024-02-22 NOTE — Progress Notes (Signed)
 Transfusion orders placed for 02/25/24. Patient educated to keep her blue armband on. Ticket sent to blood bank and stat p/u arranged w/[facility] for 4/10 at 0800.

## 2024-02-22 NOTE — Progress Notes (Signed)
 Chief Complaint  Patient presents with   Wound Check    Pt presents for wound of left heel heel that is causing her pain she was told to apply sone  urea 40% cream on the area but that does not seem to help much.      HPI: Patient is 78 y.o. female who presents today for a heel ulcer left heel-  she had this area come about and relates it is painful.  She was seen by Dr. Chestine Spore at Vascular surgery who stated she has pulses present on the pt and dp of the left foot.  She has been applying lotrisone cream to the area and wearing offloading boots at night.     Allergies  Allergen Reactions   Codeine Nausea Only    Review of systems is negative except as noted in the HPI.  Denies nausea/ vomiting/ fevers/ chills or night sweats.   Denies difficulty breathing, denies calf pain or tenderness  Physical Exam  Patient is awake, alert, and oriented x 3.  In no acute distress.    Vascular status has palpable pedal pulses DP and PT left at 1/4 and capillary refill time increased left.  Rubor upon dependency is present.  Being followed by vascular-    Neurological exam reveals epicritic and protective sensation grossly intact left.   Dermatological exam reveals pressure sore to the left heel.  See photo. It measures 2cm in diameter.  There is purple/ black brusing. Appears relatively superficial. Painful with pressure.    Assessment:   ICD-10-CM   1. Pressure ulcer of left heel, stage 2 (HCC)  W09.811       Plan-   Discussed notes from vascular indicating she should have enough blood flow to heal this area.  Recommended floating the foot on pillows during the day and wearing the protective offloading boot at night.  Recommended staying off her foot to allow this area to heal.   A mepilex dressing with iodosorb was applied-  I ordered these wound care supplies through [facility] to use at home.  They will use iodine and a fluffy bandage until the supplies are delivered. No redness or malodor around  the wound therefore no oral antibiotics ordered. She will return in 2 weeks for recheck.  Will call sooner if any increased redness, swelling or drainage is noted.

## 2024-02-25 ENCOUNTER — Inpatient Hospital Stay

## 2024-02-25 ENCOUNTER — Ambulatory Visit: Payer: Medicare HMO | Admitting: Dietician

## 2024-02-25 DIAGNOSIS — D649 Anemia, unspecified: Secondary | ICD-10-CM

## 2024-02-25 MED ORDER — SODIUM CHLORIDE 0.9% FLUSH
10.0000 mL | INTRAVENOUS | Status: AC | PRN
  Administered 2024-02-25: 10 mL

## 2024-02-25 MED ORDER — MAGNESIUM SULFATE 4 GM/100ML IV SOLN
4.0000 g | Freq: Once | INTRAVENOUS | Status: AC
  Administered 2024-02-25: 4 g via INTRAVENOUS
  Filled 2024-02-25: qty 100

## 2024-02-25 MED ORDER — SODIUM CHLORIDE 0.9% IV SOLUTION
250.0000 mL | INTRAVENOUS | Status: DC
  Administered 2024-02-25: 250 mL via INTRAVENOUS

## 2024-02-25 MED ORDER — HEPARIN SOD (PORK) LOCK FLUSH 100 UNIT/ML IV SOLN
500.0000 [IU] | Freq: Every day | INTRAVENOUS | Status: AC | PRN
  Administered 2024-02-25: 500 [IU]

## 2024-02-25 NOTE — Patient Instructions (Signed)

## 2024-02-26 LAB — BPAM RBC
Blood Product Expiration Date: 202505072359
ISSUE DATE / TIME: 202504100921
Unit Type and Rh: 202505072359
Unit Type and Rh: 5100

## 2024-02-26 LAB — TYPE AND SCREEN
ABO/RH(D): O POS
Antibody Screen: NEGATIVE
Unit division: 0

## 2024-02-29 ENCOUNTER — Encounter (HOSPITAL_COMMUNITY)

## 2024-03-04 ENCOUNTER — Encounter: Payer: Self-pay | Admitting: *Deleted

## 2024-03-04 NOTE — Progress Notes (Signed)
 Received fax from renal (Dr. Ansel Kingdom) that if she is taking a PPI, he suggests it be discontinued. Patient is taking pantoprazole  40 mg daily. Per Dr. Scherrie Curt: forward to PCP to manage this issue. Faxed renal note to Dr. Rayma Calandra [phone].

## 2024-03-09 ENCOUNTER — Encounter: Payer: Self-pay | Admitting: Podiatrist

## 2024-03-09 ENCOUNTER — Ambulatory Visit: Admitting: Podiatrist

## 2024-03-09 DIAGNOSIS — L89622 Pressure ulcer of left heel, stage 2: Secondary | ICD-10-CM

## 2024-03-09 NOTE — Progress Notes (Signed)
 Chief Complaint  Patient presents with   Foot Ulcer    Follow up ulcer posterior heel left - Daughter states, "I think its been looking good. I've been trying to cut some of the hanging skin off"     HPI: Patient is 78 y.o. female who presents today for recheck of a heel ulcer left heel-  her daughter is with her today and has been caring for her.  She has been applying the iodosorb gel and mepilex dressing.  Relates this is helping and pain has improved.  She has been wearing her offloading booties and keeping pressure off the heel as well.    Allergies  Allergen Reactions   Codeine Nausea Only    Review of systems is negative except as noted in the HPI.  Denies nausea/ vomiting/ fevers/ chills or night sweats.   Denies difficulty breathing, denies calf pain or tenderness  Physical Exam  Patient is awake, alert, and oriented x 3.  In no acute distress.    Vascular status has palpable pedal pulses DP and PT left at 1/4 and capillary refill time increased left.  Rubor upon dependency is present.  Being followed by vascular-    Neurological exam reveals epicritic and protective sensation grossly intact left.   Dermatological exam reveals improvement in the pressure sore to the left heel.  Dried sloughing skin noted peri wound area.  Pressure sore itself  measures  1.5 cm in diameter.  Central eschar is noted.  Redness around the heel has improved.  See photo from prior visit and todays visit below.     Assessment:   ICD-10-CM   1. Pressure ulcer of left heel, stage 2 (HCC)  W09.811       Plan-   Sloughing skin peri wound area debrided with tissue nippers.  Recommenced continuing the small amount of iodosorb and mepilex dressing at home- this appears to be working well for her.  A dressing was applied today.  Will continue to offload.   She will return in 2 weeks for recheck.  Will call sooner if any increased redness, swelling or drainage is noted.

## 2024-03-15 ENCOUNTER — Ambulatory Visit: Admitting: Vascular Surgery

## 2024-03-25 ENCOUNTER — Other Ambulatory Visit: Payer: Self-pay | Admitting: Oncology

## 2024-03-29 ENCOUNTER — Ambulatory Visit (HOSPITAL_COMMUNITY)
Admission: RE | Admit: 2024-03-29 | Discharge: 2024-03-29 | Disposition: A | Discharge status: Home / Self Care | Source: Ambulatory Visit | Attending: Vascular Surgery | Admitting: Vascular Surgery

## 2024-03-29 DIAGNOSIS — R600 Localized edema: Secondary | ICD-10-CM | POA: Insufficient documentation

## 2024-03-30 ENCOUNTER — Other Ambulatory Visit: Payer: Self-pay | Admitting: *Deleted

## 2024-03-30 ENCOUNTER — Inpatient Hospital Stay

## 2024-03-30 ENCOUNTER — Inpatient Hospital Stay: Attending: Nurse Practitioner | Admitting: Oncology

## 2024-03-30 ENCOUNTER — Encounter: Payer: Self-pay | Admitting: *Deleted

## 2024-03-30 ENCOUNTER — Telehealth: Payer: Self-pay | Admitting: Nurse Practitioner

## 2024-03-30 VITALS — BP 159/84 | HR 77 | Temp 98.1°F | Resp 18 | Ht 60.0 in | Wt 149.0 lb

## 2024-03-30 DIAGNOSIS — D649 Anemia, unspecified: Secondary | ICD-10-CM | POA: Diagnosis not present

## 2024-03-30 DIAGNOSIS — C20 Malignant neoplasm of rectum: Secondary | ICD-10-CM | POA: Insufficient documentation

## 2024-03-30 DIAGNOSIS — Z95828 Presence of other vascular implants and grafts: Secondary | ICD-10-CM

## 2024-03-30 LAB — CMP (CANCER CENTER ONLY)
ALT: 17 U/L (ref 0–44)
AST: 31 U/L (ref 15–41)
Albumin: 3.7 g/dL (ref 3.5–5.0)
Alkaline Phosphatase: 132 U/L — ABNORMAL HIGH (ref 38–126)
Anion gap: 11 (ref 5–15)
BUN: 23 mg/dL (ref 8–23)
CO2: 25 mmol/L (ref 22–32)
Calcium: 9.3 mg/dL (ref 8.9–10.3)
Chloride: 104 mmol/L (ref 98–111)
Creatinine: 0.9 mg/dL (ref 0.44–1.00)
GFR, Estimated: >60 mL/min (ref >60)
Glucose, Bld: 139 mg/dL — ABNORMAL HIGH (ref 70–99)
Potassium: 3.8 mmol/L (ref 3.5–5.1)
Sodium: 141 mmol/L (ref 135–145)
Total Bilirubin: 0.4 mg/dL (ref 0.0–1.2)
Total Protein: 6 g/dL — ABNORMAL LOW (ref 6.5–8.1)

## 2024-03-30 LAB — CBC WITH DIFFERENTIAL (CANCER CENTER ONLY)
Abs Immature Granulocytes: 0.01 10*3/uL (ref 0.00–0.07)
Basophils Absolute: 0 10*3/uL (ref 0.0–0.1)
Basophils Relative: 0 %
Eosinophils Absolute: 0.1 10*3/uL (ref 0.0–0.5)
Eosinophils Relative: 1 %
HCT: 32.3 % — ABNORMAL LOW (ref 36.0–46.0)
Hemoglobin: 9.5 g/dL — ABNORMAL LOW (ref 12.0–15.0)
Immature Granulocytes: 0 %
Lymphocytes Relative: 13 %
Lymphs Abs: 1 10*3/uL (ref 0.7–4.0)
MCH: 25.1 pg — ABNORMAL LOW (ref 26.0–34.0)
MCHC: 29.4 g/dL — ABNORMAL LOW (ref 30.0–36.0)
MCV: 85.2 fL (ref 80.0–100.0)
Monocytes Absolute: 0.5 10*3/uL (ref 0.1–1.0)
Monocytes Relative: 7 %
Neutro Abs: 5.8 10*3/uL (ref 1.7–7.7)
Neutrophils Relative %: 79 %
Platelet Count: 139 10*3/uL — ABNORMAL LOW (ref 150–400)
RBC: 3.79 MIL/uL — ABNORMAL LOW (ref 3.87–5.11)
RDW: 18.8 % — ABNORMAL HIGH (ref 11.5–15.5)
WBC Count: 7.4 10*3/uL (ref 4.0–10.5)
nRBC: 0 % (ref 0.0–0.2)

## 2024-03-30 LAB — SAMPLE TO BLOOD BANK

## 2024-03-30 LAB — FERRITIN: Ferritin: 71 ng/mL (ref 11–307)

## 2024-03-30 LAB — MAGNESIUM: Magnesium: 1.3 mg/dL — ABNORMAL LOW (ref 1.7–2.4)

## 2024-03-30 MED ORDER — SODIUM CHLORIDE 0.9% FLUSH
10.0000 mL | Freq: Once | INTRAVENOUS | Status: AC
  Administered 2024-03-30: 10 mL via INTRAVENOUS

## 2024-03-30 MED ORDER — SODIUM CHLORIDE 0.9% FLUSH
10.0000 mL | INTRAVENOUS | Status: AC | PRN
  Administered 2024-03-30: 10 mL via INTRAVENOUS

## 2024-03-30 MED ORDER — HEPARIN SOD (PORK) LOCK FLUSH 100 UNIT/ML IV SOLN
500.0000 [IU] | Freq: Once | INTRAVENOUS | Status: AC
  Administered 2024-03-30: 500 [IU] via INTRAVENOUS

## 2024-03-30 NOTE — Progress Notes (Signed)
 [facility] OFFICE PROGRESS NOTE   Diagnosis: Rectal cancer, cervical cancer  INTERVAL HISTORY:   Mr. Villaverde returns as scheduled.  She received a Red cell transfusion and IV magnesium  02/25/2024.  She reports an improved energy level following the transfusion.  She has intermittent rectal and "vaginal "bleeding.  She reports blood is on the PureWick pad intermittently.  She has persistent edema of the right leg.  She is currently not using any support stocking.  She is taking magnesium .  Objective:  Vital signs in last 24 hours:  Blood pressure (!) 159/84, pulse 77, temperature 98.1 F (36.7 C), temperature source Temporal, resp. rate 18, height 5' (1.524 m), weight 149 lb (67.6 kg), SpO2 98%.  Resp: Lungs clear bilaterally Cardio: Regular rate and rhythm GI: No hepatosplenomegaly, left lower quadrant colostomy with brown stool, no mass Vascular: Edema throughout the left leg, mild erythema at the right greater than left lower leg.  Weeping at the right lower leg No blood on the vaginal pad  Portacath/PICC-without erythema  Lab Results:  Lab Results  Component Value Date   WBC 7.4 03/30/2024   HGB 9.5 (L) 03/30/2024   HCT 32.3 (L) 03/30/2024   MCV 85.2 03/30/2024   PLT 139 (L) 03/30/2024   NEUTROABS 5.8 03/30/2024    CMP  Lab Results  Component Value Date   NA 141 03/30/2024   K 3.8 03/30/2024   CL 104 03/30/2024   CO2 25 03/30/2024   GLUCOSE 139 (H) 03/30/2024   BUN 23 03/30/2024   CREATININE 0.90 03/30/2024   CALCIUM  9.3 03/30/2024   PROT 6.0 (L) 03/30/2024   ALBUMIN 3.7 03/30/2024   AST 31 03/30/2024   ALT 17 03/30/2024   ALKPHOS 132 (H) 03/30/2024   BILITOT 0.4 03/30/2024   GFRNONAA >60 03/30/2024   GFRAA >60 03/29/2020    Lab Results  Component Value Date   CEA1 28.60 (H) 06/12/2021   CEA 44.01 (H) 02/02/2023    Lab Results  Component Value Date   INR 1.0 02/22/2024   LABPROT 13.8 02/22/2024    Imaging:  VAS US  LOWER  EXTREMITY VENOUS REFLUX Result Date: 03/29/2024  Lower Venous Reflux Study Patient Name:  DASJA BRIERLY  Date of Exam:   03/29/2024 Medical Rec #: [id number]         Accession #:    [id number] Date of Birth: [birth year]         Patient Gender: F Patient Age:   78 years Exam Location:  [address] Procedure:      VAS US  LOWER EXTREMITY VENOUS REFLUX Referring Phys: Jimmye Moulds --------------------------------------------------------------------------------  Indications: Edema.  Risk Factors: None identified. Comparison Study: NA Performing Technologist: Doren Gammons RVT  Examination Guidelines: A complete evaluation includes B-mode imaging, spectral Doppler, color Doppler, and power Doppler as needed of all accessible portions of each vessel. Bilateral testing is considered an integral part of a complete examination. Limited examinations for reoccurring indications may be performed as noted. The reflux portion of the exam is performed with the patient in reverse Trendelenburg. Significant venous reflux is defined as >500 ms in the superficial venous system, and >1 second in the deep venous system.  Venous Reflux Times +--------------+---------+------+-----------+------------+--------+ RIGHT         Reflux NoRefluxReflux TimeDiameter cmsComments                         Yes                                  +--------------+---------+------+-----------+------------+--------+  CFV           no                                             +--------------+---------+------+-----------+------------+--------+ FV prox       no                                             +--------------+---------+------+-----------+------------+--------+ FV dist       no                                             +--------------+---------+------+-----------+------------+--------+ Popliteal     no                                              +--------------+---------+------+-----------+------------+--------+ GSV at SFJ    no                            .46              +--------------+---------+------+-----------+------------+--------+ GSV prox thighno                            .41              +--------------+---------+------+-----------+------------+--------+ GSV mid thigh no                            .42              +--------------+---------+------+-----------+------------+--------+ GSV dist thighno                            .38              +--------------+---------+------+-----------+------------+--------+ GSV at knee   no                            .41              +--------------+---------+------+-----------+------------+--------+ GSV prox calf no                            .30              +--------------+---------+------+-----------+------------+--------+ GSV mid calf  no                            .27              +--------------+---------+------+-----------+------------+--------+ GSV dist calf no                            .28              +--------------+---------+------+-----------+------------+--------+ SSV Giacomini no                            .  15              +--------------+---------+------+-----------+------------+--------+ SSV prox calf no                            .15              +--------------+---------+------+-----------+------------+--------+ SSV mid calf  no                            .16              +--------------+---------+------+-----------+------------+--------+   Summary: Right: - No evidence of deep vein thrombosis seen in the right lower extremity, from the common femoral through the popliteal veins. - No evidence of superficial venous thrombosis in the right lower extremity. - There is no evidence of venous reflux seen in the right lower extremity. - No evidence of superficial venous reflux seen in the right greater saphenous vein. - No  evidence of superficial venous reflux seen in the right short saphenous vein.  *See table(s) above for measurements and observations. Electronically signed by Jimmye Moulds MD on 03/29/2024 at 4:19:20 PM.    Final     Medications: I have reviewed the patient's current medications.   Assessment/Plan: Rectal cancer Partially obstructing mass beginning at 10 cm from the anal verge on colonoscopy 01/23/2021, biopsy-adenocarcinoma, immunohistochemical stains at [facility]-CK20, CDX-2, and SATB2 positive, patchy strong staining for p16.  The rectal tumor has a distinct immunohistochemical profile suggesting a primary colorectal tumor as opposed to metastatic cervical cancer. CT abdomen/pelvis 01/18/2021-bilateral hydronephrosis, endometrial fluid collection, prominent stool in the colon MRI pelvis 01/23/2021-tumor at 7.6 cm from the anal verge, abnormal signal bridge in the cervix, mesorectum, and anterior aspect of the rectum-similar to MRI from 2020, MRI stage could not be defined secondary to posttreatment changes in the pelvis 03/18/2021-total abdominal hysterectomy, cystoscopy with insertion of ureteral stents, exploratory laparotomy, aborted low anterior resection, descending loop colostomy--right and left fallopian tubes and ovaries negative for malignancy; excision portion of cervix positive for adenocarcinoma; uterus with invasive moderately differentiated adenocarcinoma of the cervix, HPV associated, invading through full-thickness of the cervix/lower uterine segment and into parametrial tissue, margins of resection received disrupted, cannot be evaluated; remaining uterus with extensive endocervical adenocarcinoma in situ involving lower uterine segment and replacing endometrium.  This carcinoma was felt to be a separate primary from the rectal tumor. PET scan at [facility] 04/11/2021-intense FDG activity in region of known rectal mass.  Hypermetabolic left external iliac and right inguinal lymph nodes.  Minimal FDG  uptake and small right supraclavicular lymph node.  No uptake seen in the cervix. 04/23/2021 FNA right inguinal lymph node-negative for malignancy Evaluated by Dr. Romie Coast 05/21/2021, 05/28/2021-appears to have 2 synchronous malignancies (cervical adenocarcinoma and rectal adenocarcinoma); further surgery which would entail pelvic exenteration not recommended by gynecologic oncology or colorectal surgery.  Full doses of radiotherapy not felt to be feasible.  Recommended 3 months of CAPOX and then repeat MRI of abdomen/pelvis, chest CT and colonoscopy. Cycle 1 Xeloda 06/13/2021 Cycle 1 FOLFOX 07/08/2021, oxaliplatin  dose reduced secondary to pre-existing neuropathy Cycle 2 FOLFOX 07/29/2021, Udenyca  Cycle 3 FOLFOX 08/12/2021, Udenyca  Cycle 4 FOLFOX 09/02/2021, Udenyca  Cycle 5 FOLFOX 09/17/2021, Udenyca  CTs at [facility] 10/01/2021-unchanged findings of rectal malignancy status posttreatment.  Marked thickening of the proximal duodenal wall.  Unchanged bilateral inguinal lymph nodes which are not enlarged but avid on prior PET.  Unchanged diffuse bladder  wall thickening and perivesicular stranding which may represent radiation cystitis.  New mild right lower lobe bronchial wall thickening, mucus impaction and tree-in-bud pulmonary nodules. Cycle 6 FOLFOX 10/22/2021, Udenyca  CT 01/21/2022-stable soft tissue thickening at the lower rectum, slight increase in adjacent stranding no evidence of metastatic disease in the chest, abdomen, or pelvis, interval diverting loop colostomy CT pelvis and lower extremities 04/22/2023-subcutaneous fat stranding in the lower legs bilaterally on the right greater than left, enlarged right inguinal lymph nodes, total left hip arthroplasty with stable acetabular bone erosions, ill-defined soft tissue density in the region of the distal rectum with surrounding fat stranding unchanged     Bilateral hydronephrosis-secondary to urinary retention? Hematometra found on pelvic MRI 01/23/2021, evaluated by  gynecologic oncology Cervical cancer November 1994, stage Ib, treated with external beam radiation and brachytherapy at [facility] Diabetes Neuropathy Chronic pain secondary to #6 Recurrent urinary tract infections History of a CVA History of hyperthyroidism G3, P3 COVID-19 infection January 2021 Admission 08/25/2021 with a urinary tract infection-culture negative Anemia secondary to chronic disease, chemotherapy, and rectal bleeding-1 unit packed red blood cells 08/26/2021, 02/25/2024 Hospital admission 09/23/2021-symptomatic anemia 16.  Admission 10/08/2021 with altered mental status, nausea/vomiting, and left foot ulcers Urine culture 10/08/2021 positive for Pseudomonas aeruginosa EGD 10/12/2021-gastric and duodenal ulcers-not bleeding 17.  Admission 11/29/2021 through 12/02/2021 with COVID-pneumonia 18.  Right lower extremity cellulitis 04/22/2023-Bactrim , repeat course of Bactrim  05/07/2023 19.  Admission 09/10/2023 with severe symptomatic anemia, dark stool-Hemoccult positive 3 units packed red blood cells Upper endoscopy 09/11/2023: Duodenal ulcers with strictures likely source of GI bleeding, no active bleeding. 20.  Chronic hypomagnesemia-etiology unclear    Disposition: Ms. Leino appears stable.  She has chronic rectal/vaginal bleeding, likely secondary to persistent tumor in the pelvis.  She will call for increased bleeding or symptoms of anemia.  She has chronic hypomagnesemia of unclear etiology.  She will receive IV magnesium  supplementation tomorrow.  She agrees to a nephrology referral.  I recommended she elevate the right leg and use a support stocking for the edema.  Ms. Visone will return for an office and lab visit in 1 month.  We discussed treatment of the cancer.  She decided against further chemotherapy.  She does not wish to consider an extensive pelvic surgery for removal of any remaining tumor.  Coni Deep, MD  03/30/2024  11:23 AM

## 2024-03-30 NOTE — Patient Instructions (Signed)

## 2024-03-30 NOTE — Progress Notes (Addendum)
 Made collaborative nurse Amalia Badder RN supporting Dr. Scherrie Curt via message that per patient- unable to stay today for IV 4 grams Magnesium  and also if needing a blood transfusion.

## 2024-03-30 NOTE — Progress Notes (Signed)
 Call to [facility] [phone]) to f/u on referral sent in April for hypermagnesemia. Per voice mail asking if she had an appointment yet, if so please send office note. Also inquired if she has a f/u appointment?

## 2024-03-30 NOTE — Addendum Note (Signed)
 Addended by: Elsa Halls on: 03/30/2024 12:03 PM   Modules accepted: Orders

## 2024-03-30 NOTE — Telephone Encounter (Signed)
 Patient has been scheduled for follow-up visit per 03/24/24 LOS.  LVM notifying pt of appt details, provided my direct number to pt if appt changes need to be made.

## 2024-03-31 ENCOUNTER — Inpatient Hospital Stay

## 2024-03-31 MED ORDER — HEPARIN SOD (PORK) LOCK FLUSH 100 UNIT/ML IV SOLN
500.0000 [IU] | Freq: Once | INTRAVENOUS | Status: AC
  Administered 2024-03-31: 500 [IU] via INTRAVENOUS

## 2024-03-31 MED ORDER — SODIUM CHLORIDE 0.9% FLUSH
10.0000 mL | INTRAVENOUS | Status: DC | PRN
  Administered 2024-03-31: 10 mL via INTRAVENOUS

## 2024-03-31 MED ORDER — MAGNESIUM SULFATE 4 GM/100ML IV SOLN
4.0000 g | Freq: Once | INTRAVENOUS | Status: AC
  Administered 2024-03-31: 4 g via INTRAVENOUS
  Filled 2024-03-31: qty 100

## 2024-03-31 NOTE — Patient Instructions (Signed)
 Hypomagnesemia Hypomagnesemia is a condition in which the level of magnesium in the blood is too low. Magnesium is a mineral that is found in many foods. It is used in many different processes in the body. Hypomagnesemia can affect every organ in the body. In severe cases, it can cause life-threatening problems. What are the causes? This condition may be caused by: Not getting enough magnesium in your diet or not having enough healthy foods to eat (malnutrition). Problems with magnesium absorption in the intestines. Dehydration. Excessive use of alcohol. Vomiting. Severe or long-term (chronic) diarrhea. Some medicines, including medicines that make you urinate more often (diuretics). Certain diseases, such as kidney disease, diabetes, celiac disease, and overactive thyroid. What are the signs or symptoms? Symptoms of this condition include: Loss of appetite, nausea, and vomiting. Involuntary shaking or trembling of a body part (tremor). Muscle weakness or tingling in the arms and legs. Sudden tightening of muscles (muscle spasms). Confusion. Psychiatric issues, such as: Depression and irritability. Psychosis. A feeling of fluttering of the heart (palpitations). Seizures. These symptoms are more severe if magnesium levels drop suddenly. How is this diagnosed? This condition may be diagnosed based on: Your symptoms and medical history. A physical exam. Blood and urine tests. How is this treated? Treatment depends on the cause and the severity of the condition. It may be treated by: Taking a magnesium supplement. This can be taken in pill form. If the condition is severe, magnesium is usually given through an IV. Making changes to your diet. You may be directed to eat foods that have a lot of magnesium, such as green leafy vegetables, peas, beans, and nuts. Not drinking alcohol. If you are struggling not to drink, ask your health care provider for help. Follow these instructions at  home: Eating and drinking     Make sure that your diet includes foods with magnesium. Foods that have a lot of magnesium in them include: Green leafy vegetables, such as spinach and broccoli. Beans and peas. Nuts and seeds, such as almonds and sunflower seeds. Whole grains, such as whole grain bread and fortified cereals. Drink fluids that contain salts and minerals (electrolytes), such as sports drinks, when you are active. Do not drink alcohol. General instructions Take over-the-counter and prescription medicines only as told by your health care provider. Take magnesium supplements as directed if your health care provider tells you to take them. Have your magnesium levels monitored as told by your health care provider. Keep all follow-up visits. This is important. Contact a health care provider if: You get worse instead of better. Your symptoms return. Get help right away if: You develop severe muscle weakness. You have trouble breathing. You feel that your heart is racing. These symptoms may represent a serious problem that is an emergency. Do not wait to see if the symptoms will go away. Get medical help right away. Call your local emergency services (911 in the [place] Do not drive yourself to the hospital. Summary Hypomagnesemia is a condition in which the level of magnesium in the blood is too low. Hypomagnesemia can affect every organ in the body. Treatment may include eating more foods that contain magnesium, taking magnesium supplements, and not drinking alcohol. Have your magnesium levels monitored as told by your health care provider. This information is not intended to replace advice given to you by your health care provider. Make sure you discuss any questions you have with your health care provider. Document Revised: 04/02/2021 Document Reviewed: 04/02/2021 Elsevier Patient Education  2024 [employer]

## 2024-03-31 NOTE — Progress Notes (Signed)
 POCT glucose check resulted: 246

## 2024-04-01 ENCOUNTER — Telehealth: Payer: Self-pay | Admitting: *Deleted

## 2024-04-01 NOTE — Telephone Encounter (Signed)
 Call to [facility] [phone]) to f/u on referral sent in April for  hypermagnesemia. Was told it has been received and she was rated on 4/17 as a level 4 on 1-4 rating and could be 1-2 months before she is seen. Dr. Scherrie Curt notified.

## 2024-04-04 ENCOUNTER — Other Ambulatory Visit: Payer: Self-pay | Admitting: Oncology

## 2024-04-04 DIAGNOSIS — Z95828 Presence of other vascular implants and grafts: Secondary | ICD-10-CM

## 2024-04-06 ENCOUNTER — Ambulatory Visit: Admitting: Podiatrist

## 2024-04-06 ENCOUNTER — Encounter: Payer: Self-pay | Admitting: Podiatrist

## 2024-04-06 DIAGNOSIS — L89622 Pressure ulcer of left heel, stage 2: Secondary | ICD-10-CM

## 2024-04-06 DIAGNOSIS — L97511 Non-pressure chronic ulcer of other part of right foot limited to breakdown of skin: Secondary | ICD-10-CM | POA: Diagnosis not present

## 2024-04-06 NOTE — Progress Notes (Signed)
 Chief Complaint  Patient presents with   Diabetic Ulcer    Daughter stated, "It's doing a little better but not much.  She has a place on her right foot that needs to be looked at too." N - place on other foot L - 1st met right D - 2 days O - suddenly, about the same C - black  A - difficulty walking had a Stroke T - none     HPI: Patient is 78 y.o. female who presents today for follow up of ulcer left heel. She presents today with her daughter who is also her caregiver.  Relates Trenyce has a new spot on her right foot that needs to be looked at.      Allergies  Allergen Reactions   Codeine Nausea Only    Review of systems is negative except as noted in the HPI.  Denies nausea/ vomiting/ fevers/ chills or night sweats.   Denies difficulty breathing, denies calf pain or tenderness  Physical Exam  Patient is awake, alert, and oriented x 3.  In no acute distress.       Vascular status has palpable pedal pulses DP and PT left at 1/4 and capillary refill time increased left.  Rubor upon dependency is present.  Being followed by vascular-     Neurological exam reveals epicritic and protective sensation grossly intact left.    Dermatological exam reveals continued pressure sore to the left heel.  Dried sloughing skin noted peri wound area.  Pressure sore itself  has gotten larger and  measures 2 cm in diameter, 0.4cm depth.  Central eschar is noted.  Redness around the heel has improved.       Right foot medial aspect has a small scab present-  appears to have been a blister that has scabbed over.  No redness, no swelling noted. No malodor present.   Assessment:   ICD-10-CM   1. Pressure ulcer of left heel, stage 2 (HCC)  G95.621     2. Ulcer of right foot limited to breakdown of skin (HCC)  L97.511        Plan: Exam findings and treatment recommendations are discussed.  At this point there is no improvement in the heel ulcer and a new ulcer area has formed on the right  foot- medial first metatarsal.  I recommended promogran to the left heel ulcer and I showed her how to apply.  Ordered this for her through [facility].  I also recommended a referral to the wound center for further treatment- she agreed and this will be set up She will make a follow up appt in 2 weeks in case wound care can't get her in before then.  If any redness, drainage, or signs of infection arise, I recommended she go to the ED.

## 2024-04-12 ENCOUNTER — Ambulatory Visit: Admitting: Vascular Surgery

## 2024-04-21 ENCOUNTER — Telehealth: Payer: Self-pay | Admitting: Urology

## 2024-04-21 ENCOUNTER — Encounter: Payer: Self-pay | Admitting: Podiatry

## 2024-04-21 ENCOUNTER — Ambulatory Visit: Admitting: Podiatry

## 2024-04-21 VITALS — Ht 60.0 in | Wt 149.0 lb

## 2024-04-21 DIAGNOSIS — L89620 Pressure ulcer of left heel, unstageable: Secondary | ICD-10-CM

## 2024-04-21 MED ORDER — MEDIHONEY WOUND/BURN DRESSING EX PSTE: 1.0000 | PASTE | Freq: Every day | CUTANEOUS | 0 refills | Status: DC

## 2024-04-21 MED ORDER — SANTYL 250 UNIT/GM EX OINT
1.0000 | TOPICAL_OINTMENT | Freq: Every day | CUTANEOUS | 0 refills | Status: AC
Start: 2024-04-21 — End: 2024-05-21

## 2024-04-21 NOTE — Telephone Encounter (Signed)
 Pts daughter called saying the RX (collagenase (SANTYL) 250 UNIT/GM ointment ) that was sent in is way too expensive, even with insurance it was over $100. Daughter said her mom can not afford that. Is there anything else that can be sent in or OTC that she can get that cost less? Please advise. Thanks.

## 2024-04-21 NOTE — Addendum Note (Signed)
 Addended byMichalene Agee, Gloyd Happ R on: 04/21/2024 04:33 PM   Modules accepted: Orders

## 2024-04-21 NOTE — Progress Notes (Signed)
  Subjective:  Patient ID: [name], female    DOB: [birth year],  MRN: [id number]  Chief Complaint  Patient presents with   Wound Check    Pt is here to f/u on left pressure wound on the heel, daughter states she thinks it look better then before, she make sure she keeps it clean and wrapped up.    78 y.o. female presents with the above complaint. History confirmed with patient.  She returns for follow-up she saw Dr. Fulton Job in April and he was satisfied with perfusion for healing the wound, also saw Dr. Maybelle Spatz recently and prescribed her wound care supplies with collagen dressings  Objective:  Physical Exam: Pulses weakly palpable foot is fairly warm, full-thickness ulcer posterior heel measures 2.5 cm x 2.5 cm with large eschar present, no active drainage or cellulitis or signs of infection    Assessment:   1. Unstageable pressure ulcer of left heel (HCC)      Plan:  Patient was evaluated and treated and all questions answered.  Continue local wound care, offloading with heel protector boots which they are doing she says she has not done this every night and I emphasized the importance of this.  Recommend switching to Santyl collagenase dressing to facilitate debridement and somatically of the wound it is painful for her and so sharp surgical debridement in the office is not tolerable at this point.  She has follow-up in July for the wound care center and will keep this appointment.  Follow-up with me in 4 weeks.  Rx sent to pharmacy for Santyl.  Discussed application including applying to the wound bed the thickness of a nickel applying saline wet-to-dry dressings over this.  Return in about 4 weeks (around 05/19/2024) for wound care.

## 2024-04-22 ENCOUNTER — Other Ambulatory Visit: Payer: Self-pay | Admitting: Podiatry

## 2024-04-22 ENCOUNTER — Ambulatory Visit: Admitting: Dietician

## 2024-04-26 NOTE — Telephone Encounter (Signed)
 Left message requesting call back to inform patient of need to purchase MediHoney otc.

## 2024-04-28 ENCOUNTER — Other Ambulatory Visit

## 2024-04-28 ENCOUNTER — Ambulatory Visit

## 2024-04-28 ENCOUNTER — Ambulatory Visit: Admitting: Nurse Practitioner

## 2024-05-02 ENCOUNTER — Encounter: Payer: Self-pay | Admitting: Nurse Practitioner

## 2024-05-02 ENCOUNTER — Inpatient Hospital Stay

## 2024-05-02 ENCOUNTER — Inpatient Hospital Stay: Admitting: Nurse Practitioner

## 2024-05-02 ENCOUNTER — Inpatient Hospital Stay: Attending: Nurse Practitioner

## 2024-05-02 ENCOUNTER — Other Ambulatory Visit: Payer: Self-pay

## 2024-05-02 VITALS — BP 156/53 | HR 68 | Temp 98.1°F | Resp 18 | Ht 60.0 in | Wt 153.4 lb

## 2024-05-02 DIAGNOSIS — D649 Anemia, unspecified: Secondary | ICD-10-CM

## 2024-05-02 DIAGNOSIS — C20 Malignant neoplasm of rectum: Secondary | ICD-10-CM

## 2024-05-02 DIAGNOSIS — Z95828 Presence of other vascular implants and grafts: Secondary | ICD-10-CM

## 2024-05-02 LAB — CBC WITH DIFFERENTIAL (CANCER CENTER ONLY)
Abs Immature Granulocytes: 0.02 10*3/uL (ref 0.00–0.07)
Basophils Absolute: 0 10*3/uL (ref 0.0–0.1)
Basophils Relative: 0 %
Eosinophils Absolute: 0 10*3/uL (ref 0.0–0.5)
Eosinophils Relative: 1 %
HCT: 31.5 % — ABNORMAL LOW (ref 36.0–46.0)
Hemoglobin: 9.4 g/dL — ABNORMAL LOW (ref 12.0–15.0)
Immature Granulocytes: 0 %
Lymphocytes Relative: 15 %
Lymphs Abs: 1 10*3/uL (ref 0.7–4.0)
MCH: 24.9 pg — ABNORMAL LOW (ref 26.0–34.0)
MCHC: 29.8 g/dL — ABNORMAL LOW (ref 30.0–36.0)
MCV: 83.3 fL (ref 80.0–100.0)
Monocytes Absolute: 0.5 10*3/uL (ref 0.1–1.0)
Monocytes Relative: 8 %
Neutro Abs: 4.7 10*3/uL (ref 1.7–7.7)
Neutrophils Relative %: 76 %
Platelet Count: 162 10*3/uL (ref 150–400)
RBC: 3.78 MIL/uL — ABNORMAL LOW (ref 3.87–5.11)
RDW: 16.7 % — ABNORMAL HIGH (ref 11.5–15.5)
WBC Count: 6.2 10*3/uL (ref 4.0–10.5)
nRBC: 0 % (ref 0.0–0.2)

## 2024-05-02 LAB — BASIC METABOLIC PANEL - CANCER CENTER ONLY
Anion gap: 15 (ref 5–15)
BUN: 21 mg/dL (ref 8–23)
CO2: 25 mmol/L (ref 22–32)
Calcium: 8.4 mg/dL — ABNORMAL LOW (ref 8.9–10.3)
Chloride: 103 mmol/L (ref 98–111)
Creatinine: 0.91 mg/dL (ref 0.44–1.00)
GFR, Estimated: >60 mL/min (ref >60)
Glucose, Bld: 128 mg/dL — ABNORMAL HIGH (ref 70–99)
Potassium: 4.1 mmol/L (ref 3.5–5.1)
Sodium: 143 mmol/L (ref 135–145)

## 2024-05-02 LAB — SAMPLE TO BLOOD BANK

## 2024-05-02 LAB — MAGNESIUM: Magnesium: 0.9 mg/dL — CL (ref 1.7–2.4)

## 2024-05-02 MED ORDER — SODIUM CHLORIDE 0.9 % IV SOLN: INTRAVENOUS | Status: DC

## 2024-05-02 MED ORDER — SODIUM CHLORIDE 0.9% FLUSH: 3.0000 mL | INTRAVENOUS | Status: DC | PRN

## 2024-05-02 MED ORDER — MAGNESIUM SULFATE 4 GM/100ML IV SOLN
4.0000 g | Freq: Once | INTRAVENOUS | Status: AC
  Filled 2024-05-02: qty 100

## 2024-05-02 MED ORDER — SODIUM CHLORIDE 0.9% FLUSH: 3.0000 mL | Freq: Two times a day (BID) | INTRAVENOUS | Status: DC

## 2024-05-02 NOTE — Patient Instructions (Signed)
 Magnesium Sulfate Injection What is this medication? MAGNESIUM SULFATE (mag NEE zee um SUL fate) prevents and treats low levels of magnesium in your body. It may also be used to prevent and treat seizures during pregnancy in people with high blood pressure disorders, such as preeclampsia or eclampsia. Magnesium plays an important role in maintaining the health of your muscles and nervous system. This medicine may be used for other purposes; ask your health care provider or pharmacist if you have questions. What should I tell my care team before I take this medication? They need to know if you have any of these conditions: Heart disease History of irregular heart beat Kidney disease An unusual or allergic reaction to magnesium sulfate, medications, foods, dyes, or preservatives Pregnant or trying to get pregnant Breast-feeding How should I use this medication? This medication is for infusion into a vein. It is given in a hospital or clinic setting. Talk to your care team about the use of this medication in children. While this medication may be prescribed for selected conditions, precautions do apply. Overdosage: If you think you have taken too much of this medicine contact a poison control center or emergency room at once. NOTE: This medicine is only for you. Do not share this medicine with others. What if I miss a dose? This does not apply. What may interact with this medication? Certain medications for anxiety or sleep Certain medications for seizures, such phenobarbital Digoxin Medications that relax muscles for surgery Narcotic medications for pain This list may not describe all possible interactions. Give your health care provider a list of all the medicines, herbs, non-prescription drugs, or dietary supplements you use. Also tell them if you smoke, drink alcohol, or use illegal drugs. Some items may interact with your medicine. What should I watch for while using this  medication? Your condition will be monitored carefully while you are receiving this medication. You may need blood work done while you are receiving this medication. What side effects may I notice from receiving this medication? Side effects that you should report to your care team as soon as possible: Allergic reactions--skin rash, itching, hives, swelling of the face, lips, tongue, or throat High magnesium level--confusion, drowsiness, facial flushing, redness, sweating, muscle weakness, fast or irregular heartbeat, trouble breathing Low blood pressure--dizziness, feeling faint or lightheaded, blurry vision Side effects that usually do not require medical attention (report to your care team if they continue or are bothersome): Headache Nausea This list may not describe all possible side effects. Call your doctor for medical advice about side effects. You may report side effects to FDA at 1-800-FDA-1088. Where should I keep my medication? This medication is given in a hospital or clinic and will not be stored at home. NOTE: This sheet is a summary. It may not cover all possible information. If you have questions about this medicine, talk to your doctor, pharmacist, or health care provider.  2024 Elsevier/Gold Standard (2021-07-17 00:00:00)

## 2024-05-02 NOTE — Progress Notes (Signed)
 [facility] OFFICE PROGRESS NOTE   Diagnosis: Rectal cancer, cervical cancer  INTERVAL HISTORY:   Ms. Rozenberg returns as scheduled.  Overall she feels well.  She has a good appetite.  No recent rectal or vaginal bleeding.  No abdominal pain.  No nausea or vomiting.  She reports a nonhealing sore at the left heel.  She is followed at the wound clinic.  Objective:  Vital signs in last 24 hours:  Blood pressure (!) 156/53, pulse 68, temperature 98.1 F (36.7 C), temperature source Temporal, resp. rate 18, height 5' (1.524 m), weight 153 lb 6.4 oz (69.6 kg), SpO2 98%.    HEENT: No thrush or ulcers. Lymphatics: No palpable cervical, supraclavicular, axillary or inguinal lymph nodes. Resp: Lungs clear bilaterally. Cardio: Regular rate and rhythm. GI: No hepatosplenomegaly.  Left lower quadrant colostomy with brown stool in the collection bag. Vascular: Edema throughout the right leg; edema left lower leg.  Mild erythema bilateral lower leg.  Chronic skin change bilateral lower leg.   Skin: Left heel is wrapped.  Bandage not removed. Port-A-Cath without erythema.  Lab Results:  Lab Results  Component Value Date   WBC 6.2 05/02/2024   HGB 9.4 (L) 05/02/2024   HCT 31.5 (L) 05/02/2024   MCV 83.3 05/02/2024   PLT 162 05/02/2024   NEUTROABS 4.7 05/02/2024    Imaging:  No results found.  Medications: I have reviewed the patient's current medications.  Assessment/Plan: Rectal cancer Partially obstructing mass beginning at 10 cm from the anal verge on colonoscopy 01/23/2021, biopsy-adenocarcinoma, immunohistochemical stains at [facility]-CK20, CDX-2, and SATB2 positive, patchy strong staining for p16.  The rectal tumor has a distinct immunohistochemical profile suggesting a primary colorectal tumor as opposed to metastatic cervical cancer. CT abdomen/pelvis 01/18/2021-bilateral hydronephrosis, endometrial fluid collection, prominent stool in the colon MRI pelvis 01/23/2021-tumor  at 7.6 cm from the anal verge, abnormal signal bridge in the cervix, mesorectum, and anterior aspect of the rectum-similar to MRI from 2020, MRI stage could not be defined secondary to posttreatment changes in the pelvis 03/18/2021-total abdominal hysterectomy, cystoscopy with insertion of ureteral stents, exploratory laparotomy, aborted low anterior resection, descending loop colostomy--right and left fallopian tubes and ovaries negative for malignancy; excision portion of cervix positive for adenocarcinoma; uterus with invasive moderately differentiated adenocarcinoma of the cervix, HPV associated, invading through full-thickness of the cervix/lower uterine segment and into parametrial tissue, margins of resection received disrupted, cannot be evaluated; remaining uterus with extensive endocervical adenocarcinoma in situ involving lower uterine segment and replacing endometrium.  This carcinoma was felt to be a separate primary from the rectal tumor. PET scan at [facility] 04/11/2021-intense FDG activity in region of known rectal mass.  Hypermetabolic left external iliac and right inguinal lymph nodes.  Minimal FDG uptake and small right supraclavicular lymph node.  No uptake seen in the cervix. 04/23/2021 FNA right inguinal lymph node-negative for malignancy Evaluated by Dr. Romie Coast 05/21/2021, 05/28/2021-appears to have 2 synchronous malignancies (cervical adenocarcinoma and rectal adenocarcinoma); further surgery which would entail pelvic exenteration not recommended by gynecologic oncology or colorectal surgery.  Full doses of radiotherapy not felt to be feasible.  Recommended 3 months of CAPOX and then repeat MRI of abdomen/pelvis, chest CT and colonoscopy. Cycle 1 Xeloda 06/13/2021 Cycle 1 FOLFOX 07/08/2021, oxaliplatin  dose reduced secondary to pre-existing neuropathy Cycle 2 FOLFOX 07/29/2021, Udenyca  Cycle 3 FOLFOX 08/12/2021, Udenyca  Cycle 4 FOLFOX 09/02/2021, Udenyca  Cycle 5 FOLFOX 09/17/2021, Udenyca  CT[facility] at [facility]  10/01/2021-unchanged findings of rectal malignancy status posttreatment.  Marked thickening  of the proximal duodenal wall.  Unchanged bilateral inguinal lymph nodes which are not enlarged but avid on prior PET.  Unchanged diffuse bladder wall thickening and perivesicular stranding which may represent radiation cystitis.  New mild right lower lobe bronchial wall thickening, mucus impaction and tree-in-bud pulmonary nodules. Cycle 6 FOLFOX 10/22/2021, Udenyca  CT 01/21/2022-stable soft tissue thickening at the lower rectum, slight increase in adjacent stranding no evidence of metastatic disease in the chest, abdomen, or pelvis, interval diverting loop colostomy CT pelvis and lower extremities 04/22/2023-subcutaneous fat stranding in the lower legs bilaterally on the right greater than left, enlarged right inguinal lymph nodes, total left hip arthroplasty with stable acetabular bone erosions, ill-defined soft tissue density in the region of the distal rectum with surrounding fat stranding unchanged     Bilateral hydronephrosis-secondary to urinary retention? Hematometra found on pelvic MRI 01/23/2021, evaluated by gynecologic oncology Cervical cancer November 1994, stage Ib, treated with external beam radiation and brachytherapy at [facility] Diabetes Neuropathy Chronic pain secondary to #6 Recurrent urinary tract infections History of a CVA History of hyperthyroidism G3, P3 COVID-19 infection January 2021 Admission 08/25/2021 with a urinary tract infection-culture negative Anemia secondary to chronic disease, chemotherapy, and rectal bleeding-1 unit packed red blood cells 08/26/2021, 02/25/2024 Hospital admission 09/23/2021-symptomatic anemia 16.  Admission 10/08/2021 with altered mental status, nausea/vomiting, and left foot ulcers Urine culture 10/08/2021 positive for Pseudomonas aeruginosa EGD 10/12/2021-gastric and duodenal ulcers-not bleeding 17.  Admission 11/29/2021 through 12/02/2021 with  COVID-pneumonia 18.  Right lower extremity cellulitis 04/22/2023-Bactrim , repeat course of Bactrim  05/07/2023 19.  Admission 09/10/2023 with severe symptomatic anemia, dark stool-Hemoccult positive 3 units packed red blood cells Upper endoscopy 09/11/2023: Duodenal ulcers with strictures likely source of GI bleeding, no active bleeding. 20.  Chronic hypomagnesemia-etiology unclear      Disposition: Ms. Hornbaker appears stable.  She has had no rectal/vaginal bleeding since her last office visit.  We reviewed the CBC from today.  Hemoglobin is stable.  She has chronic hypomagnesemia of unclear etiology.  She will receive IV magnesium  today.  She has been referred to nephrology.  She has a nonhealing wound at the left heel.  She is followed in the wound clinic.  She will return for lab and follow-up in approximately 4 weeks.    Diana Forster ANP/GNP-BC   05/02/2024  12:25 PM

## 2024-05-18 ENCOUNTER — Other Ambulatory Visit: Payer: Self-pay | Admitting: Oncology

## 2024-05-18 ENCOUNTER — Other Ambulatory Visit: Payer: Self-pay | Admitting: Podiatry

## 2024-05-18 DIAGNOSIS — Z95828 Presence of other vascular implants and grafts: Secondary | ICD-10-CM

## 2024-05-18 DIAGNOSIS — R21 Rash and other nonspecific skin eruption: Secondary | ICD-10-CM

## 2024-05-18 MED ORDER — PANTOPRAZOLE SODIUM 40 MG PO TBEC: 40.0000 mg | DELAYED_RELEASE_TABLET | Freq: Every day | ORAL | 0 refills | Status: DC

## 2024-05-18 MED ORDER — MAGNESIUM OXIDE -MG SUPPLEMENT 400 (240 MG) MG PO TABS: 2.0000 | ORAL_TABLET | Freq: Two times a day (BID) | ORAL | 0 refills | Status: DC

## 2024-05-18 NOTE — Telephone Encounter (Signed)
 Call from [facility]. Patient has requested her Mg+, pantoprazole  and EMLA  cream transferred there.

## 2024-05-19 ENCOUNTER — Encounter: Payer: Self-pay | Admitting: Podiatry

## 2024-05-19 ENCOUNTER — Ambulatory Visit: Admitting: Podiatry

## 2024-05-19 DIAGNOSIS — L89622 Pressure ulcer of left heel, stage 2: Secondary | ICD-10-CM | POA: Diagnosis not present

## 2024-05-19 NOTE — Progress Notes (Signed)
  Subjective:  Patient ID: [name], female    DOB: 1946/08/12,  MRN: [id number]  Chief Complaint  Patient presents with   Foot Ulcer    Rm23 follow up wound left heel/ odor with wound    78 y.o. female presents with the above complaint. History confirmed with patient.  They were not able to obtain the sample due to insurance issues and [facility] is backordered.  Home wound care nurse has been visiting with orders from her PCP  Objective:  Physical Exam: Pulses weakly palpable foot is fairly warm, full-thickness ulcer posterior heel measures 2.5 cm x 2.0 cm time 0.6 cm with large eschar present, no active drainage or cellulitis or signs of infection.  Slight green discoloration    Assessment:   1. Pressure ulcer of left heel, stage 2 (HCC)      Plan:  Patient was evaluated and treated and all questions answered.  Debrided the overlying eschar in a full-thickness excisional manner to the subcutaneous layer today to remove eschar and nonviable tissue.  Hemostasis achieved manually.  Post ray measurements noted above.  There is a small greenish coloration I took a culture of the area.  She has a follow-up with wound care in 2 weeks.  Maintain this follow-up schedule return as needed to me if surgical intervention is necessary.  Will notify of culture results if antibiotic therapy needs to be added or changed  No follow-ups on file.

## 2024-05-21 ENCOUNTER — Emergency Department (HOSPITAL_COMMUNITY)

## 2024-05-21 ENCOUNTER — Other Ambulatory Visit: Payer: Self-pay

## 2024-05-21 ENCOUNTER — Emergency Department (HOSPITAL_COMMUNITY)
Admission: EM | Admit: 2024-05-21 | Discharge: 2024-05-21 | Disposition: A | Discharge status: Home / Self Care | Attending: Emergency Medicine | Admitting: Emergency Medicine

## 2024-05-21 ENCOUNTER — Encounter (HOSPITAL_COMMUNITY): Payer: Self-pay

## 2024-05-21 DIAGNOSIS — N1330 Unspecified hydronephrosis: Secondary | ICD-10-CM | POA: Insufficient documentation

## 2024-05-21 DIAGNOSIS — Z859 Personal history of malignant neoplasm, unspecified: Secondary | ICD-10-CM | POA: Diagnosis not present

## 2024-05-21 DIAGNOSIS — E119 Type 2 diabetes mellitus without complications: Secondary | ICD-10-CM | POA: Diagnosis not present

## 2024-05-21 DIAGNOSIS — Z87891 Personal history of nicotine dependence: Secondary | ICD-10-CM | POA: Insufficient documentation

## 2024-05-21 DIAGNOSIS — Z7984 Long term (current) use of oral hypoglycemic drugs: Secondary | ICD-10-CM | POA: Insufficient documentation

## 2024-05-21 DIAGNOSIS — Z96642 Presence of left artificial hip joint: Secondary | ICD-10-CM | POA: Diagnosis not present

## 2024-05-21 DIAGNOSIS — R109 Unspecified abdominal pain: Secondary | ICD-10-CM | POA: Diagnosis present

## 2024-05-21 DIAGNOSIS — E039 Hypothyroidism, unspecified: Secondary | ICD-10-CM | POA: Diagnosis not present

## 2024-05-21 LAB — CBC
HCT: 31.3 % — ABNORMAL LOW (ref 36.0–46.0)
Hemoglobin: 9.1 g/dL — ABNORMAL LOW (ref 12.0–15.0)
MCH: 24.9 pg — ABNORMAL LOW (ref 26.0–34.0)
MCHC: 29.1 g/dL — ABNORMAL LOW (ref 30.0–36.0)
MCV: 85.8 fL (ref 80.0–100.0)
Platelets: 140 K/uL — ABNORMAL LOW (ref 150–400)
RBC: 3.65 MIL/uL — ABNORMAL LOW (ref 3.87–5.11)
RDW: 16.2 % — ABNORMAL HIGH (ref 11.5–15.5)
WBC: 11.7 K/uL — ABNORMAL HIGH (ref 4.0–10.5)
nRBC: 0 % (ref 0.0–0.2)

## 2024-05-21 LAB — URINALYSIS, ROUTINE W REFLEX MICROSCOPIC
Bacteria, UA: NONE SEEN
Bilirubin Urine: NEGATIVE
Glucose, UA: NEGATIVE mg/dL
Ketones, ur: NEGATIVE mg/dL
Nitrite: NEGATIVE
Protein, ur: 100 mg/dL — AB
Specific Gravity, Urine: 1.036 — ABNORMAL HIGH (ref 1.005–1.030)
pH: 8 (ref 5.0–8.0)

## 2024-05-21 LAB — BASIC METABOLIC PANEL WITH GFR
Anion gap: 10 (ref 5–15)
BUN: 18 mg/dL (ref 8–23)
CO2: 26 mmol/L (ref 22–32)
Calcium: 8.1 mg/dL — ABNORMAL LOW (ref 8.9–10.3)
Chloride: 103 mmol/L (ref 98–111)
Creatinine, Ser: 0.97 mg/dL (ref 0.44–1.00)
GFR, Estimated: 60 mL/min — ABNORMAL LOW (ref >60)
Glucose, Bld: 75 mg/dL (ref 70–99)
Potassium: 4.4 mmol/L (ref 3.5–5.1)
Sodium: 139 mmol/L (ref 135–145)

## 2024-05-21 MED ORDER — CEFPODOXIME PROXETIL 200 MG PO TABS: 200.0000 mg | ORAL_TABLET | Freq: Two times a day (BID) | ORAL | 0 refills | Status: DC

## 2024-05-21 MED ORDER — OXYCODONE HCL 5 MG PO TABS: 5.0000 mg | ORAL_TABLET | ORAL | 0 refills | Status: DC | PRN

## 2024-05-21 MED ORDER — HEPARIN SOD (PORK) LOCK FLUSH 100 UNIT/ML IV SOLN
500.0000 [IU] | Freq: Once | INTRAVENOUS | Status: AC
  Administered 2024-05-21: 500 [IU]
  Filled 2024-05-21: qty 5

## 2024-05-21 MED ORDER — MORPHINE SULFATE (PF) 4 MG/ML IV SOLN
4.0000 mg | Freq: Once | INTRAVENOUS | Status: AC
  Administered 2024-05-21: 4 mg via INTRAVENOUS
  Filled 2024-05-21: qty 1

## 2024-05-21 MED ORDER — IOHEXOL 300 MG/ML  SOLN
100.0000 mL | Freq: Once | INTRAMUSCULAR | Status: AC | PRN
  Administered 2024-05-21: 100 mL via INTRAVENOUS

## 2024-05-21 MED ORDER — LACTATED RINGERS IV BOLUS
1000.0000 mL | Freq: Once | INTRAVENOUS | Status: AC
  Administered 2024-05-21: 1000 mL via INTRAVENOUS

## 2024-05-21 MED ORDER — ONDANSETRON HCL 4 MG/2ML IJ SOLN
4.0000 mg | Freq: Once | INTRAMUSCULAR | Status: AC
  Administered 2024-05-21: 4 mg via INTRAVENOUS
  Filled 2024-05-21: qty 2

## 2024-05-21 NOTE — ED Notes (Signed)
 Purewick placed on patient.

## 2024-05-21 NOTE — ED Provider Notes (Signed)
 [place] EMERGENCY DEPARTMENT AT [facility] Provider Note  CSN: [id number] Arrival date & time: 05/21/24 1244  Chief Complaint(s) Flank Pain  HPI [name] is a 78 y.o. female here today for right-sided flank pain.  Patient is concerned that she has a large kidney stone.  She says that she met with her urologist a few weeks ago who told her that the stone was too large for her to pass and it would likely have to be broken up.  Patient also with history of rectal cancer, has a ostomy bag.  She denies any fever, pain with urination or increased urinary frequency.   Past Medical History Past Medical History:  Diagnosis Date   Arthritis    Basal ganglia stroke [facility])    March 2022   Bilateral lower extremity edema    Cancer (HCC)    twice- colon cancer, unsure of the other type of cancer   Carotid artery occlusion    Flank pain    Full dentures    History of cervical cancer    11/ 1994  Stage IB  s/p  high dose radiation brachytherapy @ [facility] 01/ 1995--- per pt no recurrence   History of chronic bronchitis    History of hyperthyroidism    d 03/ 2011---due to grave's disease--- s/p  RAI treatment 04/ 2011   History of kidney stones    History of sepsis 05/03/2018   due to UTI with klebsiella/ pyelonephritis/ ureteral obstruction cause by stone   Hyperlipidemia    Hypothyroidism, postradioiodine therapy    endocrinologist--  dr kassie--  dx graves disease and s/p RAI i131 treatement 4/ 2011   Nauseated    Oral thrush 05/03/2018   Pneumonia    Type 2 diabetes mellitus treated with insulin  (HCC)    FOLLOWED BY PCP   Urgency of urination    Wears glasses    Patient Active Problem List   Diagnosis Date Noted   Multiple drug resistant organism (MDRO) culture positive 10/19/2023   PUD (peptic ulcer disease) 10/15/2023   Coffee ground emesis 10/14/2023   Upper abdominal pain, unspecified 10/14/2023   Acute UTI 10/14/2023   Gastrointestinal hemorrhage 10/13/2023    Upper GI bleed 10/12/2023   Opioid dependence (HCC) 09/12/2023   Chronic diastolic CHF (congestive heart failure) (HCC) 09/10/2023   Pain in joint of right knee 09/01/2023   Parastomal hernia without obstruction or gangrene 04/27/2023   Colostomy complication (HCC) 04/27/2023   Ear stuffiness, right 11/24/2022   PAD (peripheral artery disease) (HCC) 11/30/2021   Obesity (BMI 30-39.9) 11/30/2021   Acute respiratory failure with hypoxia (HCC) 11/29/2021   Sepsis due to pneumonia (HCC) 11/29/2021   Severe protein-calorie malnutrition (HCC) 11/29/2021   Acute respiratory disease due to COVID-19 virus 11/29/2021   Duodenal ulcer    Acute gastric ulcer with hemorrhage    Abnormal CT of the abdomen    Malnutrition of moderate degree 10/09/2021   Sepsis (HCC) 10/08/2021   Symptomatic anemia 09/23/2021   Pressure injury of skin 08/26/2021   Sepsis secondary to UTI (HCC) 08/25/2021   Goals of care, counseling/discussion 07/01/2021   Coronary artery disease 03/13/2021   Cellulitis of right lower limb 02/22/2021   Malignant neoplasm of overlapping sites of cervix (HCC) 02/18/2021   Rectal cancer (HCC) 01/29/2021   Rectal mass    Hydronephrosis    Iron deficiency anemia due to chronic blood loss    Non-intractable vomiting    Lower abdominal pain  UTI (urinary tract infection) 01/18/2021   Peripheral nerve disease 09/07/2020   Vitamin D  deficiency 09/07/2020   Carotid artery stenosis, symptomatic, right 03/28/2020   Preoperative clearance 03/21/2020   History of COVID-19 02/22/2020   Carotid artery disease (HCC) 02/22/2020   History of CVA (cerebrovascular accident) 02/11/2020   Macrocytic anemia 11/29/2019   Hypokalemia 07/27/2019   Hypomagnesemia 07/27/2019   History of cervical cancer 03/18/2019   DMII (diabetes mellitus, type 2) (HCC) 05/04/2018   Essential hypertension 05/04/2018   Bilateral lower extremity edema 07/31/2017   Hypothyroidism 06/06/2010   Hyperlipidemia  associated with type 2 diabetes mellitus (HCC) 10/20/2008   Home Medication(s) Prior to Admission medications   Medication Sig Start Date End Date Taking? Authorizing Provider  cefpodoxime  (VANTIN ) 200 MG tablet Take 1 tablet (200 mg total) by mouth 2 (two) times daily for 7 days. 05/21/24 05/28/24 Yes Mannie Pac T, DO  oxyCODONE  (ROXICODONE ) 5 MG immediate release tablet Take 1 tablet (5 mg total) by mouth every 4 (four) hours as needed for severe pain (pain score 7-10). 05/21/24  Yes Mannie Pac T, DO  acetaminophen  (TYLENOL ) 500 MG tablet Take 500 mg by mouth daily as needed for moderate pain (pain score 4-6) or headache.    [provider]  amoxicillin -clavulanate (AUGMENTIN ) 500-125 MG tablet Take 1 tablet by mouth 2 (two) times daily. 03/18/24   [provider]  atorvastatin  (LIPITOR) 40 MG tablet Take 40 mg by mouth at bedtime. 12/15/19   [provider]  baclofen  (LIORESAL ) 10 MG tablet Take 1 tablet (10 mg total) by mouth 2 (two) times daily. 10/19/23   Dezii, Alexandra, DO  clotrimazole -betamethasone  (LOTRISONE ) cream APPLY CREAM TOPICALLY ONCE DAILY TO HEELS *NEW PRESCRIPTION REQUEST* 05/18/24   McDonald, Juliene SAUNDERS, DPM  collagenase  (SANTYL ) 250 UNIT/GM ointment Apply 1 Application topically daily. Apply 1 g to heel ulcer daily the thickness of a nickel and cover with a saline wet-to-dry gauze dressing.  Wound measurements 2.5 cm x 2.5 cm 04/21/24 05/21/24  McDonald, Juliene SAUNDERS, DPM  Continuous Glucose Receiver (FREESTYLE LIBRE 3 READER) DEVI as directed. 01/28/24   [provider]  Continuous Glucose Sensor (FREESTYLE LIBRE 3 SENSOR) MISC SMARTSIG:1 Every 2 Weeks 01/24/24   [provider]  cyanocobalamin  (VITAMIN B12) 1000 MCG tablet Take 1,000 mcg by mouth daily.    [provider]  gabapentin  (NEURONTIN ) 300 MG capsule Take 300-600 mg by mouth See admin instructions. Take one capsule (300 mg) by mouth every morning and two capsules (600 mg) at  night    [provider]  Homeopathic Products (LEG CRAMPS) TABS Take 1 tablet by mouth 2 (two) times daily as needed (leg pain).    [provider]  leptospermum manuka honey (MEDIHONEY) PSTE paste Apply 1 Application topically daily. 04/21/24   McDonald, Juliene SAUNDERS, DPM  levothyroxine  (SYNTHROID ) 137 MCG tablet Take 137 mcg by mouth daily before breakfast.    [provider]  lidocaine -prilocaine  (EMLA ) cream APPLY 1/2 (ONE-HALF) TABLESPOON TO PORT SITE 1 TO 2 HOURS PRIOR TO STICK AND COVER WITH PRESS-AND-SEAL TO NUMB SITE. *NEW PRESCRIPTION REQUEST* 05/18/24   Cloretta Arley NOVAK, MD  magnesium  oxide (MAG-OX) 400 MG tablet TAKE TWO (2) TABLETS BY MOUTH TWICE DAILY *NEW PRESCRIPTION REQUEST* 05/18/24   Cloretta Arley NOVAK, MD  MAGnesium -Oxide 400 (240 Mg) MG tablet Take 2 tablets (800 mg total) by mouth 2 (two) times daily. 05/18/24   Cloretta Arley NOVAK, MD  MELATONIN PO Take 1 tablet by mouth at  bedtime.    [provider]  metFORMIN  (GLUCOPHAGE ) 500 MG tablet Take 500 mg by mouth 2 (two) times daily with a meal. 02/01/21   [provider]  nystatin  (NYAMYC ) powder APPLY POWDER TOPICALLY TWICE DAILY TO GROIN RASH 05/19/24   Sherrill, Gary B, MD  oxyCODONE  ER (XTAMPZA  ER) 9 MG C12A Take 9 mg by mouth in the morning and at bedtime.    [provider]  pantoprazole  (PROTONIX ) 40 MG tablet Take 1 tablet (40 mg total) by mouth daily. 05/18/24   Cloretta Arley NOVAK, MD  sertraline  (ZOLOFT ) 50 MG tablet Take 50 mg by mouth at bedtime.    [provider]  TRESIBA  FLEXTOUCH 100 UNIT/ML FlexTouch Pen Inject 40 Units into the skin at bedtime. 11/20/23   [provider]                                                                                                                                    Past Surgical History Past Surgical History:  Procedure Laterality Date   APPENDECTOMY     BIOPSY  01/23/2021   Procedure: BIOPSY;  Surgeon: Shila Gustav GAILS, MD;   Location: [facility];  Service: Endoscopy;;   BIOPSY  10/12/2021   Procedure: BIOPSY;  Surgeon: Eda Iha, MD;  Location: [facility];  Service: Gastroenterology;;   BIOPSY  09/11/2023   Procedure: BIOPSY;  Surgeon: Charlanne Groom, MD;  Location: [facility];  Service: Gastroenterology;;   BIOPSY  10/13/2023   Procedure: BIOPSY;  Surgeon: Federico Rosario BROCKS, MD;  Location: [facility];  Service: Gastroenterology;;   CAROTID ENDARTERECTOMY Right 03/28/2020   CATARACT EXTRACTION W/ INTRAOCULAR LENS  IMPLANT, BILATERAL  2004  approx.   COLONOSCOPY     COLONOSCOPY WITH PROPOFOL  N/A 01/23/2021   Procedure: COLONOSCOPY WITH PROPOFOL ;  Surgeon: Shila Gustav GAILS, MD;  Location: [facility];  Service: Endoscopy;  Laterality: N/A;   CYSTOSCOPY WITH STENT PLACEMENT Right 05/04/2018   Procedure: CYSTOSCOPY WITH STENT PLACEMENT AND RETROGRADE PYELOGRAM;  Surgeon: Devere Lonni Righter, MD;  Location: [facility];  Service: Urology;  Laterality: Right;   CYSTOSCOPY/URETEROSCOPY/HOLMIUM LASER/STENT PLACEMENT Right 05/19/2018   Procedure: CYSTOSCOPY, URETEROSCOPY/HOLMIUM LASER, STONE BASKETRY/ STENT EXCHANGE;  Surgeon: Devere Lonni Righter, MD;  Location: [facility];  Service: Urology;  Laterality: Right;   ENDARTERECTOMY Right 03/28/2020   Procedure: RIGHT CAROTID ENDARTERECTOMY with PATCH ANGIOPLASTY;  Surgeon: Oris Krystal FALCON, MD;  Location: [facility] OR;  Service: Vascular;  Laterality: Right;   ENTEROSCOPY N/A 10/13/2023   Procedure: ENTEROSCOPY;  Surgeon: Federico Rosario BROCKS, MD;  Location: [facility];  Service: Gastroenterology;  Laterality: N/A;   ESOPHAGOGASTRODUODENOSCOPY (EGD) WITH PROPOFOL  N/A 10/12/2021   Procedure: ESOPHAGOGASTRODUODENOSCOPY (EGD) WITH PROPOFOL ;  Surgeon: Eda Iha, MD;  Location: [facility];  Service: Gastroenterology;  Laterality: N/A;   ESOPHAGOGASTRODUODENOSCOPY (EGD) WITH PROPOFOL  N/A 09/11/2023   Procedure: ESOPHAGOGASTRODUODENOSCOPY (EGD)  WITH PROPOFOL ;  Surgeon: Charlanne Groom, MD;  Location: [facility];  Service: Gastroenterology;  Laterality: N/A;   EXCISION MASS LEFT CHEST WALL  09-20-2007   dr sebastian  [facility]   HOT HEMOSTASIS  10/13/2023   Procedure: HOT HEMOSTASIS (ARGON PLASMA COAGULATION/BICAP);  Surgeon: Federico Rosario BROCKS, MD;  Location: [facility];  Service: Gastroenterology;;   Radioactive Iodine  Therapy     for thyroid    REVISION TOTAL HIP ARTHROPLASTY Left early 2000s   SUBMUCOSAL TATTOO INJECTION  01/23/2021   Procedure: SUBMUCOSAL TATTOO INJECTION;  Surgeon: Shila Gustav GAILS, MD;  Location: [facility];  Service: Endoscopy;;   TANDEM RING INSERTION  1995   dr olean @ [facility]   EUA w/ tandem placement in ovid  (for direct high dose radiation brachytherapy , cervical cancer)   TOTAL HIP ARTHROPLASTY Left 1990s   TRANSTHORACIC ECHOCARDIOGRAM  08/07/2017   mild focal basal hypertrophy of the septum,  ef 60-65%, grade 1 diastolic dysfunction/  atrial septum with lipomatous hypertrophy/  trivial PR   UPPER GASTROINTESTINAL ENDOSCOPY     Family History Family History  Problem Relation Age of Onset   Emphysema Father    Diabetes Father    Emphysema Sister    Emphysema Brother    Cancer Mother        Skin Cancer   Diabetes Mother    Colon cancer Neg Hx    Esophageal cancer Neg Hx    Rectal cancer Neg Hx    Stomach cancer Neg Hx     Social History Social History   Tobacco Use   Smoking status: Former    Current packs/day: 0.00    Types: Cigarettes    Start date: 05/13/1977    Quit date: 05/13/1982    Years since quitting: 42.0    Passive exposure: Past   Smokeless tobacco: Never  Vaping Use   Vaping status: Never Used  Substance Use Topics   Alcohol use: No   Drug use: No   Allergies Codeine  Review of Systems Review of Systems  Physical Exam Vital Signs  I have reviewed the triage vital signs BP (!) 136/45   Pulse 62   Temp 98.7 F (37.1 C) (Oral)   Resp 16   Ht 5' (1.524 m)    Wt 65.8 kg   SpO2 94%   BMI 28.32 kg/m   Physical Exam Vitals and nursing note reviewed.  Constitutional:      Appearance: She is not toxic-appearing.  HENT:     Head: Normocephalic.  Eyes:     Pupils: Pupils are equal, round, and reactive to light.  Cardiovascular:     Rate and Rhythm: Normal rate.  Pulmonary:     Effort: Pulmonary effort is normal.  Abdominal:     General: Abdomen is flat.     Palpations: Abdomen is soft.     Comments: Well-appearing ostomy stump, normal waste in bag  Musculoskeletal:     Cervical back: Normal range of motion.     ED Results and Treatments Labs (all labs ordered are listed, but only abnormal results are displayed) Labs Reviewed  URINALYSIS, ROUTINE W REFLEX MICROSCOPIC - Abnormal; Notable for the following components:      Result Value   APPearance HAZY (*)    Specific Gravity, Urine 1.036 (*)    Hgb urine dipstick MODERATE (*)    Protein, ur 100 (*)    Leukocytes,Ua LARGE (*)    All other components within normal limits  BASIC METABOLIC PANEL WITH GFR - Abnormal; Notable for the following  components:   Calcium  8.1 (*)    GFR, Estimated 60 (*)    All other components within normal limits  CBC - Abnormal; Notable for the following components:   WBC 11.7 (*)    RBC 3.65 (*)    Hemoglobin 9.1 (*)    HCT 31.3 (*)    MCH 24.9 (*)    MCHC 29.1 (*)    RDW 16.2 (*)    Platelets 140 (*)    All other components within normal limits                                                                                                                          Radiology CT ABDOMEN PELVIS W CONTRAST Result Date: 05/21/2024 CLINICAL DATA:  Right flank pain EXAM: CT ABDOMEN AND PELVIS WITH CONTRAST TECHNIQUE: Multidetector CT imaging of the abdomen and pelvis was performed using the standard protocol following bolus administration of intravenous contrast. RADIATION DOSE REDUCTION: This exam was performed according to the departmental  dose-optimization program which includes automated exposure control, adjustment of the mA and/or kV according to patient size and/or use of iterative reconstruction technique. CONTRAST:  OMNIPAQUE  IOHEXOL  300 MG/ML  SOLN COMPARISON:  10/12/2023 FINDINGS: Lower chest: No acute findings Hepatobiliary: No focal hepatic abnormality. Gallbladder unremarkable. Pancreas: No focal abnormality or ductal dilatation. Spleen: No focal abnormality.  Normal size. Adrenals/Urinary Tract: Adrenal glands are normal. Bilateral hydronephrosis, right greater than left. The urinary bladder is decompressed and the distal ureters are obscured by beam hardening artifact from left hip replacement, but no visible obstructing stone. No renal mass. Stomach/Bowel: Stomach and small bowel decompressed, unremarkable. Left lower quadrant diverting loop colostomy noted. Suggestion of low-density rectal mass again noted, difficult to visualize due to beam hardening artifact from left hip replacement, measuring approximately 4.5 x 3.0 cm. Vascular/Lymphatic: Diffuse aortoiliac atherosclerosis. No evidence of aneurysm or adenopathy. Reproductive: Prior hysterectomy.  No adnexal masses. Other: No free fluid or free air. Musculoskeletal: Prior left hip replacement. No aggressive appearing focal bone lesion or acute bony abnormality. IMPRESSION: Bilateral hydronephrosis, right greater than left. The ureters are dilated into the pelvis without visible obstructing stone. However, the distal course of the ureters as well as the urinary bladder are obscured by beam hardening artifact from left hip replacement. There appears to be a low-density mass in the region of the rectum, also partially obscured by beam hardening artifact. This could be related to the patient's rectal cancer and the cause of the bilateral hydronephrosis. Left lower quadrant loop diverting colostomy noted. No evidence of bowel obstruction. As seen before, concern for rectal mass as  described above, partially obscured by beam hardening artifact from left hip replacement. Aortic atherosclerosis. Electronically Signed   By: Franky Crease M.D.   On: 05/21/2024 15:16    Pertinent labs & imaging results that were available during my care of the patient were reviewed by me and considered in my medical decision making (see MDM for details).  Medications Ordered in ED Medications  morphine  (PF) 4 MG/ML injection 4 mg (4 mg Intravenous Given 05/21/24 1354)  ondansetron  (ZOFRAN ) injection 4 mg (4 mg Intravenous Given 05/21/24 1354)  iohexol  (OMNIPAQUE ) 300 MG/ML solution 100 mL (100 mLs Intravenous Contrast Given 05/21/24 1449)  lactated ringers  bolus 1,000 mL (1,000 mLs Intravenous New Bag/Given 05/21/24 1633)                                                                                                                                     Procedures Procedures  (including critical care time)  Medical Decision Making / ED Course   This patient presents to the ED for concern of flank pain, this involves an extensive number of treatment options, and is a complaint that carries with it a high risk of complications and morbidity.  The differential diagnosis includes nephrolithiasis, pyelonephritis, colitis, less like obstruction   MDM:  Patient believes that her pain is being caused by her kidney stone, she has had several of these.  I am inclined to agree with her.  Will obtain imaging of the patient's abdomen pelvis, blood work, urinalysis ordered.  Reassessment 4:30 PM-patient's CT imaging shows hydronephrosis, no clear source, but likely related to her known metastatic disease.  Renal function is at baseline.  Urine, questionable UTI, will plan to treat.  Spoke with Dr. Jairo of urology, was able to arrange close outpatient follow-up, discussion of nephrostomy tubes given the patient's current palliative plan for her metastatic cancer.  Discussed with patient, she is agreeable to  plan.  Giving the patient a liter of fluid given her high specific gravity in her urine.  No systemic signs of infection.     Additional history obtained: -Additional history obtained from daughter at bedside -External records from outside source obtained and reviewed including: Chart review including previous notes, labs, imaging, consultation notes   Lab Tests: -I ordered, reviewed, and interpreted labs.   The pertinent results include:   Labs Reviewed  URINALYSIS, ROUTINE W REFLEX MICROSCOPIC - Abnormal; Notable for the following components:      Result Value   APPearance HAZY (*)    Specific Gravity, Urine 1.036 (*)    Hgb urine dipstick MODERATE (*)    Protein, ur 100 (*)    Leukocytes,Ua LARGE (*)    All other components within normal limits  BASIC METABOLIC PANEL WITH GFR - Abnormal; Notable for the following components:   Calcium  8.1 (*)    GFR, Estimated 60 (*)    All other components within normal limits  CBC - Abnormal; Notable for the following components:   WBC 11.7 (*)    RBC 3.65 (*)    Hemoglobin 9.1 (*)    HCT 31.3 (*)    MCH 24.9 (*)    MCHC 29.1 (*)    RDW 16.2 (*)    Platelets 140 (*)    All other components within  normal limits        Imaging Studies ordered: I ordered imaging studies including CT abdomen pelvis I independently visualized and interpreted imaging. I agree with the radiologist interpretation   Medicines ordered and prescription drug management: Meds ordered this encounter  Medications   morphine  (PF) 4 MG/ML injection 4 mg   ondansetron  (ZOFRAN ) injection 4 mg   iohexol  (OMNIPAQUE ) 300 MG/ML solution 100 mL   lactated ringers  bolus 1,000 mL   cefpodoxime  (VANTIN ) 200 MG tablet    Sig: Take 1 tablet (200 mg total) by mouth 2 (two) times daily for 7 days.    Dispense:  14 tablet    Refill:  0   oxyCODONE  (ROXICODONE ) 5 MG immediate release tablet    Sig: Take 1 tablet (5 mg total) by mouth every 4 (four) hours as needed for  severe pain (pain score 7-10).    Dispense:  20 tablet    Refill:  0    -I have reviewed the patients home medicines and have made adjustments as needed    Cardiac Monitoring: The patient was maintained on a cardiac monitor.  I personally viewed and interpreted the cardiac monitored which showed an underlying rhythm of: Normal sinus rhythm  Social Determinants of Health:  Factors impacting patients care include: Medical comorbidities including metastatic cancer   Reevaluation: After the interventions noted above, I reevaluated the patient and found that they have :improved  Co morbidities that complicate the patient evaluation  Past Medical History:  Diagnosis Date   Arthritis    Basal ganglia stroke [facility])    March 2022   Bilateral lower extremity edema    Cancer (HCC)    twice- colon cancer, unsure of the other type of cancer   Carotid artery occlusion    Flank pain    Full dentures    History of cervical cancer    11/ 1994  Stage IB  s/p  high dose radiation brachytherapy @ [facility] 01/ 1995--- per pt no recurrence   History of chronic bronchitis    History of hyperthyroidism    d 03/ 2011---due to grave's disease--- s/p  RAI treatment 04/ 2011   History of kidney stones    History of sepsis 05/03/2018   due to UTI with klebsiella/ pyelonephritis/ ureteral obstruction cause by stone   Hyperlipidemia    Hypothyroidism, postradioiodine therapy    endocrinologist--  dr kassie--  dx graves disease and s/p RAI i131 treatement 4/ 2011   Nauseated    Oral thrush 05/03/2018   Pneumonia    Type 2 diabetes mellitus treated with insulin  (HCC)    FOLLOWED BY PCP   Urgency of urination    Wears glasses       Dispostion: I considered admission for this patient, however with her reassuring workup and close follow-up, patient is appropriate for discharge.     Final Clinical Impression(s) / ED Diagnoses Final diagnoses:  Flank pain  Hydronephrosis, unspecified  hydronephrosis type     @PCDICTATION @    Mannie Pac T, DO 05/21/24 1634

## 2024-05-21 NOTE — Discharge Instructions (Signed)
 While you are in the emergency room, he had a CT scan that did show some swelling of your kidneys.  This is likely related to your history of cancer.  Your urologist, Dr. Devere will reach out to you within the next week to set a follow-up appointment.  I sent you a medication called oxycodone  for pain.  You can take this every 6 hours as needed for severe pain.  I have also sent you an antibiotic called cefpodoxime  that you can take once in the morning once in the evening for the next 1 week.  Return to the emergency room if you develop worsening pain, fever, confusion.

## 2024-05-21 NOTE — ED Triage Notes (Signed)
 Pt BIB [facility] from home with daughter. Hx UTI & kidney stones. R flank pain started this morning around 0800. No N/V or burning during urination reported. Has colostomy. 8/10 pain.  EMS 18 RFA 100 mcg fentanyl 

## 2024-05-24 ENCOUNTER — Encounter: Payer: Self-pay | Admitting: Vascular Surgery

## 2024-05-24 ENCOUNTER — Other Ambulatory Visit: Payer: Self-pay

## 2024-05-24 ENCOUNTER — Ambulatory Visit: Attending: Vascular Surgery | Admitting: Vascular Surgery

## 2024-05-24 VITALS — BP 144/56 | HR 61 | Temp 98.2°F | Resp 22 | Ht 60.0 in | Wt 153.9 lb

## 2024-05-24 DIAGNOSIS — I70244 Atherosclerosis of native arteries of left leg with ulceration of heel and midfoot: Secondary | ICD-10-CM

## 2024-05-24 DIAGNOSIS — I739 Peripheral vascular disease, unspecified: Secondary | ICD-10-CM

## 2024-05-24 NOTE — Progress Notes (Signed)
 Patient name: [name] MRN: [id number] DOB: [birth year] Sex: female  REASON FOR CONSULT: Right lower extremity pain and swelling  HPI: [name] is a 78 y.o. female, with history diabetes, hyperlipidemia, basal ganglia stroke, partially obstructing rectal cancer s/p colostomy that presents for right leg reflux study to evaluate leg swelling.    Previously seen in April.  The ulcer on the back of her left heel has gotten much worse.  Followed by podiatry and wound care.  ABIs 02/16/2024 are 0.85 on the right biphasic and 1.01 on the left biphasic.  Toe pressure on the left were 0.  Past Medical History:  Diagnosis Date   Arthritis    Basal ganglia stroke [facility])    March 2022   Bilateral lower extremity edema    Cancer (HCC)    twice- colon cancer, unsure of the other type of cancer   Carotid artery occlusion    Flank pain    Full dentures    History of cervical cancer    11/ 1994  Stage IB  s/p  high dose radiation brachytherapy @ [facility] 01/ 1995--- per pt no recurrence   History of chronic bronchitis    History of hyperthyroidism    d 03/ 2011---due to grave's disease--- s/p  RAI treatment 04/ 2011   History of kidney stones    History of sepsis 05/03/2018   due to UTI with klebsiella/ pyelonephritis/ ureteral obstruction cause by stone   Hyperlipidemia    Hypothyroidism, postradioiodine therapy    endocrinologist--  dr kassie--  dx graves disease and s/p RAI i131 treatement 4/ 2011   Nauseated    Oral thrush 05/03/2018   Pneumonia    Type 2 diabetes mellitus treated with insulin  (HCC)    FOLLOWED BY PCP   Urgency of urination    Wears glasses     Past Surgical History:  Procedure Laterality Date   APPENDECTOMY     BIOPSY  01/23/2021   Procedure: BIOPSY;  Surgeon: Shila Gustav GAILS, MD;  Location: [facility];  Service: Endoscopy;;   BIOPSY  10/12/2021   Procedure: BIOPSY;  Surgeon: Eda Iha, MD;  Location: [facility];  Service:  Gastroenterology;;   BIOPSY  09/11/2023   Procedure: BIOPSY;  Surgeon: Charlanne Groom, MD;  Location: [facility];  Service: Gastroenterology;;   BIOPSY  10/13/2023   Procedure: BIOPSY;  Surgeon: Federico Rosario BROCKS, MD;  Location: [facility];  Service: Gastroenterology;;   CAROTID ENDARTERECTOMY Right 03/28/2020   CATARACT EXTRACTION W/ INTRAOCULAR LENS  IMPLANT, BILATERAL  2004  approx.   COLONOSCOPY     COLONOSCOPY WITH PROPOFOL  N/A 01/23/2021   Procedure: COLONOSCOPY WITH PROPOFOL ;  Surgeon: Shila Gustav GAILS, MD;  Location: [facility];  Service: Endoscopy;  Laterality: N/A;   CYSTOSCOPY WITH STENT PLACEMENT Right 05/04/2018   Procedure: CYSTOSCOPY WITH STENT PLACEMENT AND RETROGRADE PYELOGRAM;  Surgeon: Devere Lonni Righter, MD;  Location: [facility];  Service: Urology;  Laterality: Right;   CYSTOSCOPY/URETEROSCOPY/HOLMIUM LASER/STENT PLACEMENT Right 05/19/2018   Procedure: CYSTOSCOPY, URETEROSCOPY/HOLMIUM LASER, STONE BASKETRY/ STENT EXCHANGE;  Surgeon: Devere Lonni Righter, MD;  Location: [facility];  Service: Urology;  Laterality: Right;   ENDARTERECTOMY Right 03/28/2020   Procedure: RIGHT CAROTID ENDARTERECTOMY with PATCH ANGIOPLASTY;  Surgeon: Oris Krystal JULIANNA, MD;  Location: [facility] OR;  Service: Vascular;  Laterality: Right;   ENTEROSCOPY N/A 10/13/2023   Procedure: ENTEROSCOPY;  Surgeon: Federico Rosario BROCKS, MD;  [facility]: [facility];  Service: Gastroenterology;  Laterality: N/A;  ESOPHAGOGASTRODUODENOSCOPY (EGD) WITH PROPOFOL  N/A 10/12/2021   Procedure: ESOPHAGOGASTRODUODENOSCOPY (EGD) WITH PROPOFOL ;  Surgeon: Eda Iha, MD;  [facility]: [facility];  Service: Gastroenterology;  Laterality: N/A;   ESOPHAGOGASTRODUODENOSCOPY (EGD) WITH PROPOFOL  N/A 09/11/2023   Procedure: ESOPHAGOGASTRODUODENOSCOPY (EGD) WITH PROPOFOL ;  Surgeon: Charlanne Groom, MD;  Location: [facility];  Service: Gastroenterology;  Laterality: N/A;   EXCISION MASS LEFT CHEST WALL  09-20-2007    dr sebastian  [facility]   HOT HEMOSTASIS  10/13/2023   Procedure: HOT HEMOSTASIS (ARGON PLASMA COAGULATION/BICAP);  Surgeon: Federico Rosario BROCKS, MD;  Location: [facility];  Service: Gastroenterology;;   Radioactive Iodine  Therapy     for thyroid    REVISION TOTAL HIP ARTHROPLASTY Left early 2000s   SUBMUCOSAL TATTOO INJECTION  01/23/2021   Procedure: SUBMUCOSAL TATTOO INJECTION;  Surgeon: Shila Gustav GAILS, MD;  Location: [facility];  Service: Endoscopy;;   TANDEM RING INSERTION  1995   dr olean @ [facility]   EUA w/ tandem placement in ovid  (for direct high dose radiation brachytherapy , cervical cancer)   TOTAL HIP ARTHROPLASTY Left 1990s   TRANSTHORACIC ECHOCARDIOGRAM  08/07/2017   mild focal basal hypertrophy of the septum,  ef 60-65%, grade 1 diastolic dysfunction/  atrial septum with lipomatous hypertrophy/  trivial PR   UPPER GASTROINTESTINAL ENDOSCOPY      Family History  Problem Relation Age of Onset   Emphysema Father    Diabetes Father    Emphysema Sister    Emphysema Brother    Cancer Mother        Skin Cancer   Diabetes Mother    Colon cancer Neg Hx    Esophageal cancer Neg Hx    Rectal cancer Neg Hx    Stomach cancer Neg Hx     SOCIAL HISTORY: Social History   Socioeconomic History   Marital status: Married    Spouse name: Not on file   Number of children: 2   Years of education: Not on file   Highest education level: Not on file  Occupational History   Occupation: Disabled  Tobacco Use   Smoking status: Former    Current packs/day: 0.00    Types: Cigarettes    Start date: 05/13/1977    Quit date: 05/13/1982    Years since quitting: 42.0    Passive exposure: Past   Smokeless tobacco: Never  Vaping Use   Vaping status: Never Used  Substance and Sexual Activity   Alcohol use: No   Drug use: No   Sexual activity: Not Currently  Other Topics Concern   Not on file  Social History Narrative   ** Merged History Encounter **       Married and lives with  husband Has 2 children  Housewife Disabled 2-3 cups of caffeine  R handed    Social Drivers of Corporate investment banker Strain: Not on file  Food Insecurity: Patient Unable To Answer (10/14/2023)   Hunger Vital Sign    Worried About Running Out of Food in the Last Year: Patient unable to answer    Ran Out of Food in the Last Year: Patient unable to answer  Transportation Needs: Patient Unable To Answer (10/14/2023)   PRAPARE - Transportation    Lack of Transportation (Medical): Patient unable to answer    Lack of Transportation (Non-Medical): Patient unable to answer  Physical Activity: Not on file  Stress: Not on file  Social Connections: Not on file  Intimate Partner Violence: Patient Unable To Answer (10/14/2023)  Humiliation, Afraid, Rape, and Kick questionnaire    Fear of Current or Ex-Partner: Patient unable to answer    Emotionally Abused: Patient unable to answer    Physically Abused: Patient unable to answer    Sexually Abused: Patient unable to answer    Allergies  Allergen Reactions   Codeine Nausea Only    Current Outpatient Medications  Medication Sig Dispense Refill   acetaminophen  (TYLENOL ) 500 MG tablet Take 500 mg by mouth daily as needed for moderate pain (pain score 4-6) or headache.     amoxicillin -clavulanate (AUGMENTIN ) 500-125 MG tablet Take 1 tablet by mouth 2 (two) times daily.     atorvastatin  (LIPITOR) 40 MG tablet Take 40 mg by mouth at bedtime.     baclofen  (LIORESAL ) 10 MG tablet Take 1 tablet (10 mg total) by mouth 2 (two) times daily. 30 each 0   cefpodoxime  (VANTIN ) 200 MG tablet Take 1 tablet (200 mg total) by mouth 2 (two) times daily for 7 days. 14 tablet 0   clotrimazole -betamethasone  (LOTRISONE ) cream APPLY CREAM TOPICALLY ONCE DAILY TO HEELS *NEW PRESCRIPTION REQUEST* 45 g 11   Continuous Glucose Receiver (FREESTYLE LIBRE 3 READER) DEVI as directed.     Continuous Glucose Sensor (FREESTYLE LIBRE 3 SENSOR) MISC SMARTSIG:1 Every 2  Weeks     cyanocobalamin  (VITAMIN B12) 1000 MCG tablet Take 1,000 mcg by mouth daily.     gabapentin  (NEURONTIN ) 300 MG capsule Take 300-600 mg by mouth See admin instructions. Take one capsule (300 mg) by mouth every morning and two capsules (600 mg) at night     Homeopathic Products (LEG CRAMPS) TABS Take 1 tablet by mouth 2 (two) times daily as needed (leg pain).     leptospermum manuka honey (MEDIHONEY) PSTE paste Apply 1 Application topically daily. 15 mL 0   levothyroxine  (SYNTHROID ) 137 MCG tablet Take 137 mcg by mouth daily before breakfast.     lidocaine -prilocaine  (EMLA ) cream APPLY 1/2 (ONE-HALF) TABLESPOON TO PORT SITE 1 TO 2 HOURS PRIOR TO STICK AND COVER WITH PRESS-AND-SEAL TO NUMB SITE. *NEW PRESCRIPTION REQUEST* 30 g 2   magnesium  oxide (MAG-OX) 400 MG tablet TAKE TWO (2) TABLETS BY MOUTH TWICE DAILY *NEW PRESCRIPTION REQUEST* 360 tablet 1   MAGnesium -Oxide 400 (240 Mg) MG tablet Take 2 tablets (800 mg total) by mouth 2 (two) times daily. 120 tablet 0   MELATONIN PO Take 1 tablet by mouth at bedtime.     metFORMIN  (GLUCOPHAGE ) 500 MG tablet Take 500 mg by mouth 2 (two) times daily with a meal.     nystatin  (NYAMYC ) powder APPLY POWDER TOPICALLY TWICE DAILY TO GROIN RASH 15 g 5   oxyCODONE  (ROXICODONE ) 5 MG immediate release tablet Take 1 tablet (5 mg total) by mouth every 4 (four) hours as needed for severe pain (pain score 7-10). 20 tablet 0   oxyCODONE  ER (XTAMPZA  ER) 9 MG C12A Take 9 mg by mouth in the morning and at bedtime.     pantoprazole  (PROTONIX ) 40 MG tablet Take 1 tablet (40 mg total) by mouth daily. 90 tablet 0   sertraline  (ZOLOFT ) 50 MG tablet Take 50 mg by mouth at bedtime.     TRESIBA  FLEXTOUCH 100 UNIT/ML FlexTouch Pen Inject 40 Units into the skin at bedtime.     No current facility-administered medications for this visit.   Facility-Administered Medications Ordered in Other Visits  Medication Dose Route Frequency Provider Last Rate Last Admin   sodium  chloride flush (NS) 0.9 % injection 10 mL  10 mL Intravenous PRN Cloretta Arley NOVAK, MD   10 mL at 03/30/24 1158    REVIEW OF SYSTEMS:  [X]  denotes positive finding, [ ]  denotes negative finding Cardiac  Comments:  Chest pain or chest pressure:    Shortness of breath upon exertion:    Short of breath when lying flat:    Irregular heart rhythm:        Vascular    Pain in calf, thigh, or hip brought on by ambulation:    Pain in feet at night that wakes you up from your sleep:     Blood clot in your veins:    Leg swelling:  x Right      Pulmonary    Oxygen at home:    Productive cough:     Wheezing:         Neurologic    Sudden weakness in arms or legs:     Sudden numbness in arms or legs:     Sudden onset of difficulty speaking or slurred speech:    Temporary loss of vision in one eye:     Problems with dizziness:         Gastrointestinal    Blood in stool:     Vomited blood:         Genitourinary    Burning when urinating:     Blood in urine:        Psychiatric    Major depression:         Hematologic    Bleeding problems:    Problems with blood clotting too easily:        Skin    Rashes or ulcers:        Constitutional    Fever or chills:      PHYSICAL EXAM: Vitals:   05/24/24 1425  BP: (!) 144/56  Pulse: 61  Resp: (!) 22  Temp: 98.2 F (36.8 C)  TempSrc: Temporal  SpO2: 97%  Weight: 153 lb 14.4 oz (69.8 kg)  Height: 5' (1.524 m)    GENERAL: The patient is a well-nourished female, in no acute distress. The vital signs are documented above. CARDIAC: There is a regular rate and rhythm.  VASCULAR:  Bilateral DP pulses palpable Notable 2+ edema right lower extremity Ulcer on back of left heel as pictured - much worse, full thickness necrosis PULMONARY: No respiratory distress. ABDOMEN: Soft and non-tender. MUSCULOSKELETAL: There are no major deformities or cyanosis. NEUROLOGIC: No focal weakness or paresthesias are detected. PSYCHIATRIC: The  patient has a normal affect.       DATA:   ABIs 02/16/2024 are 0.85 on the right biphasic and 1.01 on the left biphasic   Lower Venous Reflux Study   Patient Name:  [name]  Date of Exam:   03/29/2024  Medical Rec #: [id number]         Accession #:    [id number]  Date of Birth: 1946/11/17         Patient Gender: F  Patient Age:   89 years  Exam Location:  [address]  Procedure:      VAS US  LOWER EXTREMITY VENOUS REFLUX  Referring Phys: LONNI GASKINS    ---------------------------------------------------------------------------  -----    Indications: Edema.    Risk Factors: None identified.  Comparison Study: NA   Performing Technologist: Nanetta Shad RVT     Examination Guidelines: A complete evaluation includes B-mode imaging,  spectral  Doppler, color Doppler, and power Doppler as needed of  all accessible  portions  of each vessel. Bilateral testing is considered an integral part of a  complete  examination. Limited examinations for reoccurring indications may be  performed  as noted. The reflux portion of the exam is performed with the patient in  reverse Trendelenburg.  Significant venous reflux is defined as >500 ms in the superficial venous  system, and >1 second in the deep venous system.     Venous Reflux Times  +--------------+---------+------+-----------+------------+--------+  RIGHT        Reflux NoRefluxReflux TimeDiameter cmsComments                          Yes                                   +--------------+---------+------+-----------+------------+--------+  CFV          no                                              +--------------+---------+------+-----------+------------+--------+  FV prox       no                                              +--------------+---------+------+-----------+------------+--------+  FV dist       no                                               +--------------+---------+------+-----------+------------+--------+  Popliteal    no                                              +--------------+---------+------+-----------+------------+--------+  GSV at SFJ    no                            .46               +--------------+---------+------+-----------+------------+--------+  GSV prox thighno                            .41               +--------------+---------+------+-----------+------------+--------+  GSV mid thigh no                            .42               +--------------+---------+------+-----------+------------+--------+  GSV dist thighno                            .38               +--------------+---------+------+-----------+------------+--------+  GSV at knee   no                            .  41               +--------------+---------+------+-----------+------------+--------+  GSV prox calf no                            .30               +--------------+---------+------+-----------+------------+--------+  GSV mid calf  no                            .27               +--------------+---------+------+-----------+------------+--------+  GSV dist calf no                            .28               +--------------+---------+------+-----------+------------+--------+  SSV Giacomini no                            .15               +--------------+---------+------+-----------+------------+--------+  SSV prox calf no                            .15               +--------------+---------+------+-----------+------------+--------+  SSV mid calf  no                            .16               +--------------+---------+------+-----------+------------+--------+      Summary:  Right:  - No evidence of deep vein thrombosis seen in the right lower extremity,  from the common femoral through the popliteal veins.  - No evidence of superficial venous  thrombosis in the right lower  extremity.  - There is no evidence of venous reflux seen in the right lower extremity.  - No evidence of superficial venous reflux seen in the right greater  saphenous vein.  - No evidence of superficial venous reflux seen in the right short  saphenous vein.    *See table(s) above for measurements and observations.   Electronically signed by Lonni Gaskins MD on 03/29/2024 at 4:19:20 PM.       Assessment/Plan:  78 y.o. female, with history diabetes, hyperlipidemia, basal ganglia stroke, partially obstructing rectal cancer s/p colostomy that presents for ongoing follow-up for right leg reflux study due to chronic swelling.  Discussed that there is no evidence of significant reflux.  I suspect she has likely lymphedema of her lower extremities.   My biggest concern today is that her left heel wound is much worse as pictured above.  She has a large necrotic eschar and it smells.  I discussed she is very high risk for limb loss.  I have recommended aortogram with lower extremity angiogram on the left given her toe pressure of 0.  Discussed with family this is to make sure she is optimized for wound healing.  Will get scheduled in the Cath Lab.  Risk benefits discussed.   Lonni DOROTHA Gaskins, MD [facility] Office: [phone]

## 2024-05-25 ENCOUNTER — Other Ambulatory Visit: Payer: Self-pay | Admitting: Podiatry

## 2024-05-25 ENCOUNTER — Encounter: Payer: Self-pay | Admitting: Podiatry

## 2024-05-25 ENCOUNTER — Ambulatory Visit (INDEPENDENT_AMBULATORY_CARE_PROVIDER_SITE_OTHER): Admitting: Podiatry

## 2024-05-25 VITALS — Ht 60.0 in | Wt 153.0 lb

## 2024-05-25 DIAGNOSIS — I739 Peripheral vascular disease, unspecified: Secondary | ICD-10-CM | POA: Diagnosis not present

## 2024-05-25 DIAGNOSIS — E1142 Type 2 diabetes mellitus with diabetic polyneuropathy: Secondary | ICD-10-CM | POA: Diagnosis not present

## 2024-05-25 DIAGNOSIS — M79674 Pain in right toe(s): Secondary | ICD-10-CM | POA: Diagnosis not present

## 2024-05-25 DIAGNOSIS — B351 Tinea unguium: Secondary | ICD-10-CM | POA: Diagnosis not present

## 2024-05-25 DIAGNOSIS — L89622 Pressure ulcer of left heel, stage 2: Secondary | ICD-10-CM

## 2024-05-25 DIAGNOSIS — L84 Corns and callosities: Secondary | ICD-10-CM | POA: Diagnosis not present

## 2024-05-25 DIAGNOSIS — M79675 Pain in left toe(s): Secondary | ICD-10-CM

## 2024-05-26 ENCOUNTER — Emergency Department (HOSPITAL_COMMUNITY)

## 2024-05-26 ENCOUNTER — Encounter (HOSPITAL_COMMUNITY): Payer: Self-pay

## 2024-05-26 ENCOUNTER — Inpatient Hospital Stay (HOSPITAL_COMMUNITY)

## 2024-05-26 ENCOUNTER — Other Ambulatory Visit: Payer: Self-pay

## 2024-05-26 ENCOUNTER — Inpatient Hospital Stay (HOSPITAL_COMMUNITY)
Admission: EM | Admit: 2024-05-26 | Discharge: 2024-06-08 | DRG: 480 | Disposition: A | Discharge status: Skilled Nursing Facility | Attending: Family Medicine | Admitting: Family Medicine

## 2024-05-26 DIAGNOSIS — Z933 Colostomy status: Secondary | ICD-10-CM

## 2024-05-26 DIAGNOSIS — I70244 Atherosclerosis of native arteries of left leg with ulceration of heel and midfoot: Secondary | ICD-10-CM | POA: Diagnosis present

## 2024-05-26 DIAGNOSIS — J9601 Acute respiratory failure with hypoxia: Secondary | ICD-10-CM | POA: Diagnosis not present

## 2024-05-26 DIAGNOSIS — I7102 Dissection of abdominal aorta: Secondary | ICD-10-CM | POA: Diagnosis not present

## 2024-05-26 DIAGNOSIS — Z833 Family history of diabetes mellitus: Secondary | ICD-10-CM

## 2024-05-26 DIAGNOSIS — E039 Hypothyroidism, unspecified: Secondary | ICD-10-CM | POA: Diagnosis present

## 2024-05-26 DIAGNOSIS — E1165 Type 2 diabetes mellitus with hyperglycemia: Secondary | ICD-10-CM | POA: Diagnosis present

## 2024-05-26 DIAGNOSIS — S72332A Displaced oblique fracture of shaft of left femur, initial encounter for closed fracture: Secondary | ICD-10-CM | POA: Diagnosis not present

## 2024-05-26 DIAGNOSIS — L03116 Cellulitis of left lower limb: Secondary | ICD-10-CM | POA: Diagnosis present

## 2024-05-26 DIAGNOSIS — N1330 Unspecified hydronephrosis: Secondary | ICD-10-CM | POA: Diagnosis present

## 2024-05-26 DIAGNOSIS — F39 Unspecified mood [affective] disorder: Secondary | ICD-10-CM | POA: Diagnosis present

## 2024-05-26 DIAGNOSIS — E66811 Obesity, class 1: Secondary | ICD-10-CM | POA: Diagnosis present

## 2024-05-26 DIAGNOSIS — L03115 Cellulitis of right lower limb: Secondary | ICD-10-CM | POA: Diagnosis present

## 2024-05-26 DIAGNOSIS — Z87891 Personal history of nicotine dependence: Secondary | ICD-10-CM | POA: Diagnosis not present

## 2024-05-26 DIAGNOSIS — T8859XA Other complications of anesthesia, initial encounter: Secondary | ICD-10-CM | POA: Diagnosis not present

## 2024-05-26 DIAGNOSIS — E785 Hyperlipidemia, unspecified: Secondary | ICD-10-CM | POA: Diagnosis present

## 2024-05-26 DIAGNOSIS — E1140 Type 2 diabetes mellitus with diabetic neuropathy, unspecified: Secondary | ICD-10-CM | POA: Diagnosis present

## 2024-05-26 DIAGNOSIS — K5900 Constipation, unspecified: Secondary | ICD-10-CM | POA: Diagnosis not present

## 2024-05-26 DIAGNOSIS — Z8541 Personal history of malignant neoplasm of cervix uteri: Secondary | ICD-10-CM

## 2024-05-26 DIAGNOSIS — S72402A Unspecified fracture of lower end of left femur, initial encounter for closed fracture: Secondary | ICD-10-CM | POA: Diagnosis not present

## 2024-05-26 DIAGNOSIS — S7292XD Unspecified fracture of left femur, subsequent encounter for closed fracture with routine healing: Secondary | ICD-10-CM | POA: Diagnosis not present

## 2024-05-26 DIAGNOSIS — S7292XA Unspecified fracture of left femur, initial encounter for closed fracture: Secondary | ICD-10-CM | POA: Diagnosis not present

## 2024-05-26 DIAGNOSIS — I708 Atherosclerosis of other arteries: Secondary | ICD-10-CM | POA: Diagnosis present

## 2024-05-26 DIAGNOSIS — M9702XA Periprosthetic fracture around internal prosthetic left hip joint, initial encounter: Secondary | ICD-10-CM | POA: Diagnosis present

## 2024-05-26 DIAGNOSIS — L89620 Pressure ulcer of left heel, unstageable: Secondary | ICD-10-CM | POA: Diagnosis not present

## 2024-05-26 DIAGNOSIS — I10 Essential (primary) hypertension: Secondary | ICD-10-CM | POA: Diagnosis present

## 2024-05-26 DIAGNOSIS — I5032 Chronic diastolic (congestive) heart failure: Secondary | ICD-10-CM | POA: Diagnosis not present

## 2024-05-26 DIAGNOSIS — Z79890 Hormone replacement therapy (postmenopausal): Secondary | ICD-10-CM

## 2024-05-26 DIAGNOSIS — E11621 Type 2 diabetes mellitus with foot ulcer: Secondary | ICD-10-CM | POA: Diagnosis present

## 2024-05-26 DIAGNOSIS — L97429 Non-pressure chronic ulcer of left heel and midfoot with unspecified severity: Secondary | ICD-10-CM | POA: Diagnosis present

## 2024-05-26 DIAGNOSIS — Z808 Family history of malignant neoplasm of other organs or systems: Secondary | ICD-10-CM

## 2024-05-26 DIAGNOSIS — Y92009 Unspecified place in unspecified non-institutional (private) residence as the place of occurrence of the external cause: Secondary | ICD-10-CM

## 2024-05-26 DIAGNOSIS — Z9221 Personal history of antineoplastic chemotherapy: Secondary | ICD-10-CM

## 2024-05-26 DIAGNOSIS — Z713 Dietary counseling and surveillance: Secondary | ICD-10-CM

## 2024-05-26 DIAGNOSIS — E8809 Other disorders of plasma-protein metabolism, not elsewhere classified: Secondary | ICD-10-CM | POA: Diagnosis present

## 2024-05-26 DIAGNOSIS — S7290XA Unspecified fracture of unspecified femur, initial encounter for closed fracture: Secondary | ICD-10-CM | POA: Diagnosis not present

## 2024-05-26 DIAGNOSIS — Z79899 Other long term (current) drug therapy: Secondary | ICD-10-CM

## 2024-05-26 DIAGNOSIS — C539 Malignant neoplasm of cervix uteri, unspecified: Secondary | ICD-10-CM | POA: Diagnosis present

## 2024-05-26 DIAGNOSIS — Z96642 Presence of left artificial hip joint: Secondary | ICD-10-CM | POA: Diagnosis present

## 2024-05-26 DIAGNOSIS — I771 Stricture of artery: Secondary | ICD-10-CM | POA: Diagnosis not present

## 2024-05-26 DIAGNOSIS — Y92239 Unspecified place in hospital as the place of occurrence of the external cause: Secondary | ICD-10-CM | POA: Diagnosis not present

## 2024-05-26 DIAGNOSIS — Z9842 Cataract extraction status, left eye: Secondary | ICD-10-CM

## 2024-05-26 DIAGNOSIS — G8918 Other acute postprocedural pain: Secondary | ICD-10-CM | POA: Diagnosis not present

## 2024-05-26 DIAGNOSIS — Z961 Presence of intraocular lens: Secondary | ICD-10-CM | POA: Diagnosis present

## 2024-05-26 DIAGNOSIS — I890 Lymphedema, not elsewhere classified: Secondary | ICD-10-CM | POA: Diagnosis present

## 2024-05-26 DIAGNOSIS — Z6832 Body mass index (BMI) 32.0-32.9, adult: Secondary | ICD-10-CM

## 2024-05-26 DIAGNOSIS — Z885 Allergy status to narcotic agent status: Secondary | ICD-10-CM

## 2024-05-26 DIAGNOSIS — I959 Hypotension, unspecified: Secondary | ICD-10-CM | POA: Diagnosis not present

## 2024-05-26 DIAGNOSIS — Z9841 Cataract extraction status, right eye: Secondary | ICD-10-CM

## 2024-05-26 DIAGNOSIS — E1152 Type 2 diabetes mellitus with diabetic peripheral angiopathy with gangrene: Secondary | ICD-10-CM | POA: Diagnosis present

## 2024-05-26 DIAGNOSIS — W010XXA Fall on same level from slipping, tripping and stumbling without subsequent striking against object, initial encounter: Secondary | ICD-10-CM | POA: Diagnosis present

## 2024-05-26 DIAGNOSIS — M47814 Spondylosis without myelopathy or radiculopathy, thoracic region: Secondary | ICD-10-CM | POA: Diagnosis present

## 2024-05-26 DIAGNOSIS — K5901 Slow transit constipation: Secondary | ICD-10-CM | POA: Diagnosis not present

## 2024-05-26 DIAGNOSIS — D509 Iron deficiency anemia, unspecified: Secondary | ICD-10-CM | POA: Diagnosis present

## 2024-05-26 DIAGNOSIS — J9811 Atelectasis: Secondary | ICD-10-CM | POA: Diagnosis not present

## 2024-05-26 DIAGNOSIS — W19XXXA Unspecified fall, initial encounter: Secondary | ICD-10-CM | POA: Diagnosis not present

## 2024-05-26 DIAGNOSIS — C20 Malignant neoplasm of rectum: Secondary | ICD-10-CM | POA: Diagnosis present

## 2024-05-26 DIAGNOSIS — I700 Atherosclerosis of aorta: Secondary | ICD-10-CM | POA: Diagnosis present

## 2024-05-26 DIAGNOSIS — Z7984 Long term (current) use of oral hypoglycemic drugs: Secondary | ICD-10-CM

## 2024-05-26 DIAGNOSIS — K219 Gastro-esophageal reflux disease without esophagitis: Secondary | ICD-10-CM | POA: Diagnosis present

## 2024-05-26 DIAGNOSIS — Z825 Family history of asthma and other chronic lower respiratory diseases: Secondary | ICD-10-CM

## 2024-05-26 DIAGNOSIS — L97529 Non-pressure chronic ulcer of other part of left foot with unspecified severity: Secondary | ICD-10-CM | POA: Diagnosis not present

## 2024-05-26 DIAGNOSIS — E119 Type 2 diabetes mellitus without complications: Secondary | ICD-10-CM | POA: Diagnosis not present

## 2024-05-26 DIAGNOSIS — I110 Hypertensive heart disease with heart failure: Secondary | ICD-10-CM | POA: Diagnosis not present

## 2024-05-26 DIAGNOSIS — I70201 Unspecified atherosclerosis of native arteries of extremities, right leg: Secondary | ICD-10-CM | POA: Diagnosis not present

## 2024-05-26 DIAGNOSIS — Z8673 Personal history of transient ischemic attack (TIA), and cerebral infarction without residual deficits: Secondary | ICD-10-CM

## 2024-05-26 DIAGNOSIS — Z7401 Bed confinement status: Secondary | ICD-10-CM | POA: Diagnosis not present

## 2024-05-26 LAB — CBC WITH DIFFERENTIAL/PLATELET
Abs Immature Granulocytes: 0.04 K/uL (ref 0.00–0.07)
Basophils Absolute: 0 K/uL (ref 0.0–0.1)
Basophils Relative: 0 %
Eosinophils Absolute: 0 K/uL (ref 0.0–0.5)
Eosinophils Relative: 0 %
HCT: 30.8 % — ABNORMAL LOW (ref 36.0–46.0)
Hemoglobin: 8.8 g/dL — ABNORMAL LOW (ref 12.0–15.0)
Immature Granulocytes: 0 %
Lymphocytes Relative: 6 %
Lymphs Abs: 0.7 K/uL (ref 0.7–4.0)
MCH: 24.4 pg — ABNORMAL LOW (ref 26.0–34.0)
MCHC: 28.6 g/dL — ABNORMAL LOW (ref 30.0–36.0)
MCV: 85.6 fL (ref 80.0–100.0)
Monocytes Absolute: 0.8 K/uL (ref 0.1–1.0)
Monocytes Relative: 7 %
Neutro Abs: 8.9 K/uL — ABNORMAL HIGH (ref 1.7–7.7)
Neutrophils Relative %: 87 %
Platelets: 154 K/uL (ref 150–400)
RBC: 3.6 MIL/uL — ABNORMAL LOW (ref 3.87–5.11)
RDW: 15.8 % — ABNORMAL HIGH (ref 11.5–15.5)
WBC: 10.4 K/uL (ref 4.0–10.5)
nRBC: 0 % (ref 0.0–0.2)

## 2024-05-26 LAB — MAGNESIUM: Magnesium: 1.1 mg/dL — ABNORMAL LOW (ref 1.7–2.4)

## 2024-05-26 LAB — BASIC METABOLIC PANEL WITH GFR
Anion gap: 11 (ref 5–15)
BUN: 20 mg/dL (ref 8–23)
CO2: 24 mmol/L (ref 22–32)
Calcium: 8.4 mg/dL — ABNORMAL LOW (ref 8.9–10.3)
Chloride: 104 mmol/L (ref 98–111)
Creatinine, Ser: 1.12 mg/dL — ABNORMAL HIGH (ref 0.44–1.00)
GFR, Estimated: 50 mL/min — ABNORMAL LOW (ref >60)
Glucose, Bld: 196 mg/dL — ABNORMAL HIGH (ref 70–99)
Potassium: 4.2 mmol/L (ref 3.5–5.1)
Sodium: 139 mmol/L (ref 135–145)

## 2024-05-26 LAB — PROTIME-INR
INR: 1.2 (ref 0.8–1.2)
Prothrombin Time: 15.4 s — ABNORMAL HIGH (ref 11.4–15.2)

## 2024-05-26 LAB — TYPE AND SCREEN
ABO/RH(D): O POS
Antibody Screen: NEGATIVE

## 2024-05-26 MED ORDER — SODIUM CHLORIDE 0.9 % IV SOLN: INTRAVENOUS | Status: AC

## 2024-05-26 MED ORDER — ONDANSETRON HCL 4 MG/2ML IJ SOLN: 4.0000 mg | Freq: Four times a day (QID) | INTRAMUSCULAR | Status: DC | PRN

## 2024-05-26 MED ORDER — CEFAZOLIN SODIUM-DEXTROSE 1-4 GM/50ML-% IV SOLN
1.0000 g | Freq: Three times a day (TID) | INTRAVENOUS | Status: DC
  Administered 2024-05-26 – 2024-06-06 (×31): 1 g via INTRAVENOUS
  Filled 2024-05-26 (×36): qty 50

## 2024-05-26 MED ORDER — BACLOFEN 10 MG PO TABS
10.0000 mg | ORAL_TABLET | Freq: Two times a day (BID) | ORAL | Status: DC
  Administered 2024-05-26 – 2024-06-08 (×26): 10 mg via ORAL
  Filled 2024-05-26 (×26): qty 1

## 2024-05-26 MED ORDER — PANTOPRAZOLE SODIUM 40 MG PO TBEC
40.0000 mg | DELAYED_RELEASE_TABLET | Freq: Every day | ORAL | Status: DC
  Administered 2024-05-27 – 2024-06-08 (×13): 40 mg via ORAL
  Filled 2024-05-26 (×13): qty 1

## 2024-05-26 MED ORDER — SERTRALINE HCL 50 MG PO TABS
150.0000 mg | ORAL_TABLET | Freq: Every day | ORAL | Status: DC
  Administered 2024-05-26 – 2024-06-07 (×13): 150 mg via ORAL
  Filled 2024-05-26 (×13): qty 1

## 2024-05-26 MED ORDER — HYDRALAZINE HCL 20 MG/ML IJ SOLN
10.0000 mg | Freq: Four times a day (QID) | INTRAMUSCULAR | Status: DC | PRN
  Administered 2024-06-03: 10 mg via INTRAVENOUS
  Filled 2024-05-26: qty 1

## 2024-05-26 MED ORDER — FENTANYL CITRATE PF 50 MCG/ML IJ SOSY
100.0000 ug | PREFILLED_SYRINGE | INTRAMUSCULAR | Status: AC | PRN
  Administered 2024-05-26 (×2): 100 ug via INTRAVENOUS
  Filled 2024-05-26 (×2): qty 2

## 2024-05-26 MED ORDER — ACETAMINOPHEN 650 MG RE SUPP: 650.0000 mg | Freq: Four times a day (QID) | RECTAL | Status: DC | PRN

## 2024-05-26 MED ORDER — CLOTRIMAZOLE 1 % EX CREA
TOPICAL_CREAM | Freq: Every day | CUTANEOUS | Status: DC
  Filled 2024-05-26 (×3): qty 15

## 2024-05-26 MED ORDER — VITAMIN D 25 MCG (1000 UNIT) PO TABS
2000.0000 [IU] | ORAL_TABLET | Freq: Every day | ORAL | Status: DC
  Administered 2024-05-27 – 2024-06-08 (×13): 2000 [IU] via ORAL
  Filled 2024-05-26 (×13): qty 2

## 2024-05-26 MED ORDER — ACETAMINOPHEN 325 MG PO TABS
650.0000 mg | ORAL_TABLET | Freq: Four times a day (QID) | ORAL | Status: DC | PRN
  Administered 2024-05-27 – 2024-05-31 (×7): 650 mg via ORAL
  Filled 2024-05-26 (×8): qty 2

## 2024-05-26 MED ORDER — INSULIN ASPART 100 UNIT/ML IJ SOLN
0.0000 [IU] | INTRAMUSCULAR | Status: DC
  Administered 2024-05-27: 2 [IU] via SUBCUTANEOUS
  Administered 2024-05-27: 5 [IU] via SUBCUTANEOUS
  Administered 2024-05-28: 15 [IU] via SUBCUTANEOUS
  Administered 2024-05-28: 3 [IU] via SUBCUTANEOUS
  Administered 2024-05-28: 5 [IU] via SUBCUTANEOUS
  Administered 2024-05-28: 11 [IU] via SUBCUTANEOUS
  Administered 2024-05-28: 5 [IU] via SUBCUTANEOUS
  Administered 2024-05-28: 11 [IU] via SUBCUTANEOUS
  Administered 2024-05-29: 2 [IU] via SUBCUTANEOUS
  Administered 2024-05-29 (×2): 5 [IU] via SUBCUTANEOUS
  Administered 2024-05-29: 3 [IU] via SUBCUTANEOUS
  Administered 2024-05-29: 8 [IU] via SUBCUTANEOUS
  Administered 2024-05-29: 5 [IU] via SUBCUTANEOUS
  Administered 2024-05-30: 8 [IU] via SUBCUTANEOUS
  Administered 2024-05-30: 2 [IU] via SUBCUTANEOUS
  Administered 2024-05-30: 3 [IU] via SUBCUTANEOUS
  Administered 2024-05-31: 8 [IU] via SUBCUTANEOUS
  Administered 2024-05-31: 3 [IU] via SUBCUTANEOUS
  Administered 2024-05-31: 8 [IU] via SUBCUTANEOUS
  Administered 2024-06-01: 3 [IU] via SUBCUTANEOUS
  Administered 2024-06-01: 5 [IU] via SUBCUTANEOUS

## 2024-05-26 MED ORDER — GABAPENTIN 300 MG PO CAPS
600.0000 mg | ORAL_CAPSULE | Freq: Every day | ORAL | Status: DC
  Administered 2024-05-27 – 2024-06-08 (×13): 600 mg via ORAL
  Filled 2024-05-26 (×13): qty 2

## 2024-05-26 MED ORDER — ONDANSETRON HCL 4 MG PO TABS: 4.0000 mg | ORAL_TABLET | Freq: Four times a day (QID) | ORAL | Status: DC | PRN

## 2024-05-26 MED ORDER — VITAMIN B-12 1000 MCG PO TABS
1000.0000 ug | ORAL_TABLET | Freq: Every day | ORAL | Status: DC
  Administered 2024-05-27 – 2024-06-08 (×13): 1000 ug via ORAL
  Filled 2024-05-26 (×13): qty 1

## 2024-05-26 MED ORDER — SENNOSIDES-DOCUSATE SODIUM 8.6-50 MG PO TABS
1.0000 | ORAL_TABLET | Freq: Every evening | ORAL | Status: DC | PRN
  Filled 2024-05-26: qty 1

## 2024-05-26 MED ORDER — FERROUS SULFATE 325 (65 FE) MG PO TABS
325.0000 mg | ORAL_TABLET | ORAL | Status: DC
  Administered 2024-05-27 – 2024-06-08 (×7): 325 mg via ORAL
  Filled 2024-05-26 (×12): qty 1

## 2024-05-26 MED ORDER — GABAPENTIN 300 MG PO CAPS
300.0000 mg | ORAL_CAPSULE | Freq: Every day | ORAL | Status: DC
  Administered 2024-05-26 – 2024-06-07 (×13): 300 mg via ORAL
  Filled 2024-05-26 (×13): qty 1

## 2024-05-26 MED ORDER — HYDROMORPHONE HCL 1 MG/ML IJ SOLN
1.0000 mg | Freq: Once | INTRAMUSCULAR | Status: AC
  Administered 2024-05-26: 1 mg via INTRAVENOUS
  Filled 2024-05-26: qty 1

## 2024-05-26 MED ORDER — ADULT MULTIVITAMIN W/MINERALS CH
1.0000 | ORAL_TABLET | Freq: Every day | ORAL | Status: DC
  Administered 2024-05-27 – 2024-06-08 (×12): 1 via ORAL
  Filled 2024-05-26 (×12): qty 1

## 2024-05-26 MED ORDER — MAGNESIUM SULFATE 4 GM/100ML IV SOLN
4.0000 g | Freq: Once | INTRAVENOUS | Status: AC
  Administered 2024-05-26: 4 g via INTRAVENOUS
  Filled 2024-05-26: qty 100

## 2024-05-26 MED ORDER — ATORVASTATIN CALCIUM 40 MG PO TABS
40.0000 mg | ORAL_TABLET | Freq: Every day | ORAL | Status: DC
  Administered 2024-05-26 – 2024-06-07 (×13): 40 mg via ORAL
  Filled 2024-05-26 (×13): qty 1

## 2024-05-26 MED ORDER — HYDROMORPHONE HCL 1 MG/ML IJ SOLN
1.0000 mg | Freq: Four times a day (QID) | INTRAMUSCULAR | Status: DC | PRN
  Administered 2024-05-26 – 2024-05-27 (×2): 1 mg via INTRAVENOUS
  Filled 2024-05-26 (×3): qty 1

## 2024-05-26 MED ORDER — OXYCODONE HCL ER 10 MG PO T12A
10.0000 mg | EXTENDED_RELEASE_TABLET | Freq: Two times a day (BID) | ORAL | Status: DC
  Administered 2024-05-26 – 2024-05-31 (×10): 10 mg via ORAL
  Filled 2024-05-26 (×10): qty 1

## 2024-05-26 MED ORDER — LEVOTHYROXINE SODIUM 25 MCG PO TABS
137.0000 ug | ORAL_TABLET | Freq: Every day | ORAL | Status: DC
  Administered 2024-05-27 – 2024-06-08 (×12): 137 ug via ORAL
  Filled 2024-05-26 (×14): qty 1

## 2024-05-26 MED ORDER — ENSURE PLUS HIGH PROTEIN PO LIQD
237.0000 mL | Freq: Three times a day (TID) | ORAL | Status: DC
  Administered 2024-05-26 – 2024-06-08 (×24): 237 mL via ORAL
  Filled 2024-05-26: qty 237

## 2024-05-26 NOTE — Progress Notes (Signed)
 Orthopedic Tech Progress Note Patient Details:  [name] 1946-10-10 [id number]  Patient ID: [name], female   DOB: [birth year], 78 y.o.   MRN: [id number] Patient is being transferred to [facility] at [facility], traction will be applied once they arrive to their room. Laymon DELENA Munroe 05/26/2024, 7:14 PM

## 2024-05-26 NOTE — H&P (Signed)
 History and Physical  [name] FMW:[id number] DOB: 1946/02/15 DOA: 05/26/2024  PCP: Waylan Almarie SAUNDERS, MD   Chief Complaint: Fall  HPI: [name] is a 78 y.o. female with medical history significant for T2DM, HLD, HTN, cervical and rectal cancer s/p colostomy, PAD, diabetic foot ulcer,, neuropathy, hypothyroidism, hypomagnesemia, anemia and arthritis who presented to the ED after a fall at home.  Per son, after returning from PCP office, patient had 3 episodes where she lost her balance while ambulating he was able to catch her each time.  Daughter reports that patient was trying to straighten her bed, lost her balance and fell face down on the bed. Patient's left leg was bent and twisted behind her and as daughter attempted to straighten it, she heard a pop. Patient reported significant pain in the leg so she called EMS. At baseline, patient ambulates with a walker however recently, she has been losing strength in her legs. Patient has chronic lower extremity swelling and neuropathy but has had recent increased redness to her legs, L>R.  She she has had poor appetite but denies any dizziness, recent illness, fevers, chills, nausea or vomiting. She was recently treated for possible UTI about a week ago.  ED Course: Initial vitals show patient hypertensive with SBP in the 140s to 190s, otherwise normal vitals. Initial labs significant for glucose 196, creatinine 1.12, WBC 10.4, Hgb 8.8, normal INR. EKG shows normal sinus rhythm. X-ray of the left femur shows moderately angulated midshaft fracture of the left femur.  Pt received fentanyl  100 mcg x 1. Orthopedic surgery was consulted for evaluation. A Buck's traction was placed.  TRH was consulted for admission.   Review of Systems: Please see HPI for pertinent positives and negatives. A complete 10 system review of systems are otherwise negative.  Past Medical History:  Diagnosis Date   Arthritis    Basal ganglia stroke [facility])    March  2022   Bilateral lower extremity edema    Cancer (HCC)    twice- colon cancer, unsure of the other type of cancer   Carotid artery occlusion    Flank pain    Full dentures    History of cervical cancer    11/ 1994  Stage IB  s/p  high dose radiation brachytherapy @ [facility] 01/ 1995--- per pt no recurrence   History of chronic bronchitis    History of hyperthyroidism    d 03/ 2011---due to grave's disease--- s/p  RAI treatment 04/ 2011   History of kidney stones    History of sepsis 05/03/2018   due to UTI with klebsiella/ pyelonephritis/ ureteral obstruction cause by stone   Hyperlipidemia    Hypothyroidism, postradioiodine therapy    endocrinologist--  dr kassie--  dx graves disease and s/p RAI i131 treatement 4/ 2011   Nauseated    Oral thrush 05/03/2018   Pneumonia    Type 2 diabetes mellitus treated with insulin  (HCC)    FOLLOWED BY PCP   Urgency of urination    Wears glasses    Past Surgical History:  Procedure Laterality Date   APPENDECTOMY     BIOPSY  01/23/2021   Procedure: BIOPSY;  Surgeon: Shila Gustav GAILS, MD;  Location: [facility];  Service: Endoscopy;;   BIOPSY  10/12/2021   Procedure: BIOPSY;  Surgeon: Eda Iha, MD;  Location: [facility];  Service: Gastroenterology;;   BIOPSY  09/11/2023   Procedure: BIOPSY;  Surgeon: Charlanne Groom, MD;  Location: [facility];  Service: Gastroenterology;;  BIOPSY  10/13/2023   Procedure: BIOPSY;  Surgeon: Federico Rosario BROCKS, MD;  Location: [facility];  Service: Gastroenterology;;   CAROTID ENDARTERECTOMY Right 03/28/2020   CATARACT EXTRACTION W/ INTRAOCULAR LENS  IMPLANT, BILATERAL  2004  approx.   COLONOSCOPY     COLONOSCOPY WITH PROPOFOL  N/A 01/23/2021   Procedure: COLONOSCOPY WITH PROPOFOL ;  Surgeon: Shila Gustav GAILS, MD;  Location: [facility];  Service: Endoscopy;  Laterality: N/A;   CYSTOSCOPY WITH STENT PLACEMENT Right 05/04/2018   Procedure: CYSTOSCOPY WITH STENT PLACEMENT AND RETROGRADE PYELOGRAM;   Surgeon: Devere Lonni Righter, MD;  Location: [facility];  Service: Urology;  Laterality: Right;   CYSTOSCOPY/URETEROSCOPY/HOLMIUM LASER/STENT PLACEMENT Right 05/19/2018   Procedure: CYSTOSCOPY, URETEROSCOPY/HOLMIUM LASER, STONE BASKETRY/ STENT EXCHANGE;  Surgeon: Devere Lonni Righter, MD;  Location: [facility];  Service: Urology;  Laterality: Right;   ENDARTERECTOMY Right 03/28/2020   Procedure: RIGHT CAROTID ENDARTERECTOMY with PATCH ANGIOPLASTY;  Surgeon: Oris Krystal FALCON, MD;  Location: [facility] OR;  Service: Vascular;  Laterality: Right;   ENTEROSCOPY N/A 10/13/2023   Procedure: ENTEROSCOPY;  Surgeon: Federico Rosario BROCKS, MD;  Location: [facility];  Service: Gastroenterology;  Laterality: N/A;   ESOPHAGOGASTRODUODENOSCOPY (EGD) WITH PROPOFOL  N/A 10/12/2021   Procedure: ESOPHAGOGASTRODUODENOSCOPY (EGD) WITH PROPOFOL ;  Surgeon: Eda Iha, MD;  Location: [facility];  Service: Gastroenterology;  Laterality: N/A;   ESOPHAGOGASTRODUODENOSCOPY (EGD) WITH PROPOFOL  N/A 09/11/2023   Procedure: ESOPHAGOGASTRODUODENOSCOPY (EGD) WITH PROPOFOL ;  Surgeon: Charlanne Groom, MD;  Location: [facility];  Service: Gastroenterology;  Laterality: N/A;   EXCISION MASS LEFT CHEST WALL  09-20-2007   dr sebastian  [facility]   HOT HEMOSTASIS  10/13/2023   Procedure: HOT HEMOSTASIS (ARGON PLASMA COAGULATION/BICAP);  Surgeon: Federico Rosario BROCKS, MD;  Location: [facility];  Service: Gastroenterology;;   Radioactive Iodine  Therapy     for thyroid    REVISION TOTAL HIP ARTHROPLASTY Left early 2000s   SUBMUCOSAL TATTOO INJECTION  01/23/2021   Procedure: SUBMUCOSAL TATTOO INJECTION;  Surgeon: Shila Gustav GAILS, MD;  Location: [facility];  Service: Endoscopy;;   TANDEM RING INSERTION  1995   dr olean @ [facility]   EUA w/ tandem placement in ovid  (for direct high dose radiation brachytherapy , cervical cancer)   TOTAL HIP ARTHROPLASTY Left 1990s   TRANSTHORACIC ECHOCARDIOGRAM  08/07/2017   mild focal basal  hypertrophy of the septum,  ef 60-65%, grade 1 diastolic dysfunction/  atrial septum with lipomatous hypertrophy/  trivial PR   UPPER GASTROINTESTINAL ENDOSCOPY     Social History:  reports that she quit smoking about 42 years ago. Her smoking use included cigarettes. She started smoking about 47 years ago. She has been exposed to tobacco smoke. She has never used smokeless tobacco. She reports that she does not drink alcohol and does not use drugs.  Allergies  Allergen Reactions   Codeine Nausea Only    Family History  Problem Relation Age of Onset   Emphysema Father    Diabetes Father    Emphysema Sister    Emphysema Brother    Cancer Mother        Skin Cancer   Diabetes Mother    Colon cancer Neg Hx    Esophageal cancer Neg Hx    Rectal cancer Neg Hx    Stomach cancer Neg Hx      Prior to Admission medications   Medication Sig Start Date End Date Taking? Authorizing Provider  acetaminophen  (TYLENOL ) 500 MG tablet Take 500 mg by mouth daily as needed for moderate pain (pain  score 4-6) or headache.    [provider]  amoxicillin -clavulanate (AUGMENTIN ) 500-125 MG tablet Take 1 tablet by mouth 2 (two) times daily. 03/18/24   [provider]  atorvastatin  (LIPITOR) 40 MG tablet Take 40 mg by mouth at bedtime. 12/15/19   [provider]  baclofen  (LIORESAL ) 10 MG tablet Take 1 tablet (10 mg total) by mouth 2 (two) times daily. 10/19/23   Dezii, Alexandra, DO  cefpodoxime  (VANTIN ) 200 MG tablet Take 1 tablet (200 mg total) by mouth 2 (two) times daily for 7 days. 05/21/24 05/28/24  Mannie Pac T, DO  clotrimazole -betamethasone  (LOTRISONE ) cream APPLY CREAM TOPICALLY ONCE DAILY TO HEELS *NEW PRESCRIPTION REQUEST* 05/18/24   McDonald, Juliene SAUNDERS, DPM  Continuous Glucose Receiver (FREESTYLE LIBRE 3 READER) DEVI as directed. 01/28/24   [provider]  Continuous Glucose Sensor (FREESTYLE LIBRE 3 SENSOR) MISC SMARTSIG:1 Every 2 Weeks 01/24/24   [provider]  cyanocobalamin  (VITAMIN B12) 1000 MCG tablet Take 1,000 mcg by mouth daily.    [provider]  gabapentin  (NEURONTIN ) 300 MG capsule Take 300-600 mg by mouth See admin instructions. Take one capsule (300 mg) by mouth every morning and two capsules (600 mg) at night    [provider]  Homeopathic Products (LEG CRAMPS) TABS Take 1 tablet by mouth 2 (two) times daily as needed (leg pain).    [provider]  leptospermum manuka honey (MEDIHONEY) PSTE paste Apply 1 Application topically daily. 04/21/24   McDonald, Juliene SAUNDERS, DPM  levothyroxine  (SYNTHROID ) 137 MCG tablet Take 137 mcg by mouth daily before breakfast.    [provider]  lidocaine -prilocaine  (EMLA ) cream APPLY 1/2 (ONE-HALF) TABLESPOON TO PORT SITE 1 TO 2 HOURS PRIOR TO STICK AND COVER WITH PRESS-AND-SEAL TO NUMB SITE. *NEW PRESCRIPTION REQUEST* 05/18/24   Cloretta Arley NOVAK, MD  magnesium  oxide (MAG-OX) 400 MG tablet TAKE TWO (2) TABLETS BY MOUTH TWICE DAILY *NEW PRESCRIPTION REQUEST* 05/18/24   Cloretta Arley NOVAK, MD  MAGnesium -Oxide 400 (240 Mg) MG tablet Take 2 tablets (800 mg total) by mouth 2 (two) times daily. 05/18/24   Cloretta Arley NOVAK, MD  MELATONIN PO Take 1 tablet by mouth at bedtime.    [provider]  metFORMIN  (GLUCOPHAGE ) 500 MG tablet Take 500 mg by mouth 2 (two) times daily with a meal. 02/01/21   [provider]  nystatin  (NYAMYC ) powder APPLY POWDER TOPICALLY TWICE DAILY TO GROIN RASH 05/19/24   Sherrill, Gary B, MD  oxyCODONE  (ROXICODONE ) 5 MG immediate release tablet Take 1 tablet (5 mg total) by mouth every 4 (four) hours as needed for severe pain (pain score 7-10). 05/21/24   Mannie Pac T, DO  oxyCODONE  ER (XTAMPZA  ER) 9 MG C12A Take 9 mg by mouth in the morning and at bedtime.    [provider]  pantoprazole  (PROTONIX ) 40 MG tablet Take 1 tablet (40 mg total) by mouth daily. 05/18/24   Cloretta Arley NOVAK, MD  sertraline  (ZOLOFT ) 50 MG tablet Take 50 mg  by mouth at bedtime.    [provider]  TRESIBA  FLEXTOUCH 100 UNIT/ML FlexTouch Pen Inject 40 Units into the skin at bedtime. 11/20/23   [provider]    Physical Exam: BP (!) 191/63 (BP Location: Left Arm)   Pulse 79   Temp 98.8 F (37.1 C) (Oral)   Resp (!) 22   Ht 5' (1.524 m)   Wt 70 kg   SpO2 96%   BMI 30.14 kg/m  General: Pleasant, weak  appearing elderly woman laying in bed. No acute distress. HEENT: [place]/AT. Anicteric sclera Chest: Port-A-Cath in right chest wall without erythema CV: RRR. No murmurs, rubs, or gallops. 1-2+ BLE edema Pulmonary: Lungs CTAB. Normal effort. No wheezing or rales. Abdominal: Soft, NT/ND. Colostomy stable in LLQ w/ minimal stool output. Normal bowel sounds. Extremities: Faint pedal pulses. Moderate tenderness to palpation of the L knee and thigh. Left leg slightly shortened. NVI distally.  Skin: Warm and dry. Erythema of BLE, L>R, with tenderness, edema and warmth.  Left heel ulcer covered with dressing.  Neuro: A&Ox3. Moves all extremities. Normal sensation to light touch. No focal deficit. Psych: Normal mood and affect          Labs on Admission:  Basic Metabolic Panel: Recent Labs  Lab 05/21/24 1409 05/26/24 1730  NA 139 139  K 4.4 4.2  CL 103 104  CO2 26 24  GLUCOSE 75 196*  BUN 18 20  CREATININE 0.97 1.12*  CALCIUM  8.1* 8.4*   Liver Function Tests: No results for input(s): AST, ALT, ALKPHOS, BILITOT, PROT, ALBUMIN in the last 168 hours. No results for input(s): LIPASE, AMYLASE in the last 168 hours. No results for input(s): AMMONIA in the last 168 hours. CBC: Recent Labs  Lab 05/21/24 1409 05/26/24 1730  WBC 11.7* 10.4  NEUTROABS  --  8.9*  HGB 9.1* 8.8*  HCT 31.3* 30.8*  MCV 85.8 85.6  PLT 140* 154   Cardiac Enzymes: No results for input(s): CKTOTAL, CKMB, CKMBINDEX, TROPONINI in the last 168 hours. BNP (last 3 results) No results for input(s): BNP in the last 8760  hours.  ProBNP (last 3 results) No results for input(s): PROBNP in the last 8760 hours.  CBG: No results for input(s): GLUCAP in the last 168 hours.  Radiological Exams on Admission: DG Tibia/Fibula Left Result Date: 05/26/2024 CLINICAL DATA:  Fall. EXAM: LEFT TIBIA AND FIBULA - 2 VIEW COMPARISON:  None Available. FINDINGS: Only AP image could be obtained. There is no evidence of fracture or other focal bone lesions. Soft tissues are unremarkable. IMPRESSION: No definite abnormality seen on this AP image only. Electronically Signed   By: Lynwood Landy Raddle M.D.   On: 05/26/2024 17:29   DG FEMUR MIN 2 VIEWS LEFT Result Date: 05/26/2024 CLINICAL DATA:  Fall. EXAM: LEFT FEMUR 2 VIEWS COMPARISON:  None Available. FINDINGS: Status post left total hip arthroplasty. Moderately angulated fracture of the midshaft of left femur is noted. IMPRESSION: Moderately angulated midshaft fracture of left femur. Electronically Signed   By: Lynwood Landy Raddle M.D.   On: 05/26/2024 17:28   DG Pelvis 1-2 Views Result Date: 05/26/2024 CLINICAL DATA:  Fall. EXAM: PELVIS - 1-2 VIEW COMPARISON:  September 05, 2021. FINDINGS: Status post left total hip arthroplasty. No fracture or dislocation is noted. IMPRESSION: No acute abnormality seen. Electronically Signed   By: Lynwood Landy Raddle M.D.   On: 05/26/2024 17:27   DG Chest 1 View Result Date: 05/26/2024 CLINICAL DATA:  Weakness and fall. EXAM: CHEST  1 VIEW COMPARISON:  Chest radiograph dated 10/14/2023. FINDINGS: Right-sided Port-A-Cath with tip at the cavoatrial junction. No focal consolidation, pleural effusion, pneumothorax. Stable cardiac silhouette. No acute osseous pathology. IMPRESSION: No active disease. Electronically Signed   By: Vanetta Chou M.D.   On: 05/26/2024 17:24   Assessment/Plan [name] is a 78 y.o. female with medical history significant for T2DM, HLD, HTN, cervical and rectal cancer s/p colostomy, PAD, diabetic foot ulcer, neuropathy,  hypothyroidism, hypomagnesemia, anemia  and arthritis who presented to the ED after a fall at home and found to have left femur fracture.  # Mechanical fall # Left femur fracture - Elderly patient presented with left leg pain after a mechanical fall at home - X-ray of left femur shows moderately angulated midshaft fracture of the left femur - Orthopedic surgery consulted, recommends transfer to [facility] for ORIF of the femur in the morning - Ad[facility] - Buck traction placed in the ED - Pain control oxycodone  and as needed IV Dilaudid  - N.p.o. at midnight - Fall precautions  # Lower extremity cellulitis - Patient with chronic lower extremity edema found to have mild cellulitis of the lower extremities - Start IV Ancef  - Trend CBC, fever curve  # Type 2 diabetes - Last A1c 6.5% 3 months ago - Blood sugar elevated to 196 on BMP - Hold metformin  in the setting of pending surgery - Q4H SSI with CBG monitoring while NPO - Follow-up repeat A1c  # Diabetic foot ulcer # Nonhealing pressure ulcer - Patient with full-thickness ulcer at the posterior left heel with large eschar (see media tab) - No significant improvement in left heel ulcer over the last few weeks - Followed by podiatry and wound care - Wound care consulted, appreciate recs  # PAD - ABIs 02/16/2024 are 0.85 on the right biphasic and 1.01 on the left biphasic  - Follows with vascular surgery with plan for aortogram with LLE angiogram due to worsening left foot ulcer and toe pressure of 0 - Vascular surgery consulted by Ortho in regards to possible intervention during this hospitalization - Continue atorvastatin   # Hydronephrosis - CT A/P 5 days ago showed bilateral hydronephrosis, R>L but no visible obstructing stone - Hydronephrosis thought to be due to patient's rectal cancer - Follow-up with nephrology in the outpatient  # Chronic hypomagnesemia - Unclear etiology, receives intermittent IV magnesium  by  cancer center - Magnesium  low at 1.1 on admission - Start IV mag 4 g x 1 and follow-up repeat mag  # HTN - BP elevated with SBP in the 140s to 170s - IV hydralazine  PRN for SBP > 180  # Cervical cancer # Rectal cancer s/p colostomy - Followed closely with oncology, patient has declined further chemotherapy or surgery for removal of the remaining tumor - Continue vitamin supplementation  # Anemia # IDA - Hgb stable at 8.8, baseline 8-9 - Continue iron supplementation  # Chronic BLE edema - Thought to be due to lymphedema - Apply TED hose  # HLD - Continue atorvastatin   # Hypothyroidism - Continue on Synthroid   # GERD - Continue home Protonix   # Mood disorder - Continue sertraline    DVT prophylaxis: SCDs    Code Status: Full Code  Consults called: Orthopedic surgery  Family Communication: Discussed admission with daughter and son at bedside  Severity of Illness: The appropriate patient status for this patient is INPATIENT. Inpatient status is judged to be reasonable and necessary in order to provide the required intensity of service to ensure the patient's safety. The patient's presenting symptoms, physical exam findings, and initial radiographic and laboratory data in the context of their chronic comorbidities is felt to place them at high risk for further clinical deterioration. Furthermore, it is not anticipated that the patient will be medically stable for discharge from the hospital within 2 midnights of admission.   * I certify that at the point of admission it is my clinical judgment that the patient will  require inpatient hospital care spanning beyond 2 midnights from the point of admission due to high intensity of service, high risk for further deterioration and high frequency of surveillance required.*  Level of care: Med-Surg   This record has been created using Conservation officer, historic buildings. Errors have been sought and corrected, but may not always be  located. Such creation errors do not reflect on the standard of care.   Lou Claretta HERO, MD 05/26/2024, 7:03 PM [facility] Pager: [phone] Isaiah 41:10   If 7PM-7AM, please contact night-coverage [email or web address] Password [email or web address]

## 2024-05-26 NOTE — Consult Note (Signed)
 Chief Complaint: Left distal femur fracture  History: [name] is a 78 year old female who had a left lower extremity injury while being transferred out of bed.  Patient is a household ambulator but presents now unable to ambulate due with obvious deformity of the left lower extremity.  Imaging confirmed a femur fracture.   Patient denies any loss of consciousness or headaches or blurry vision.  She has a medical history of diabetes, hyperlipidemia, basal ganglion stroke and partially obstructing rectal cancer status post colonoscopy.  Past Medical History:  Diagnosis Date   Arthritis    Basal ganglia stroke [facility])    March 2022   Bilateral lower extremity edema    Cancer (HCC)    twice- colon cancer, unsure of the other type of cancer   Carotid artery occlusion    Flank pain    Full dentures    History of cervical cancer    11/ 1994  Stage IB  s/p  high dose radiation brachytherapy @ [facility] 01/ 1995--- per pt no recurrence   History of chronic bronchitis    History of hyperthyroidism    d 03/ 2011---due to grave's disease--- s/p  RAI treatment 04/ 2011   History of kidney stones    History of sepsis 05/03/2018   due to UTI with klebsiella/ pyelonephritis/ ureteral obstruction cause by stone   Hyperlipidemia    Hypothyroidism, postradioiodine therapy    endocrinologist--  dr kassie--  dx graves disease and s/p RAI i131 treatement 4/ 2011   Nauseated    Oral thrush 05/03/2018   Pneumonia    Type 2 diabetes mellitus treated with insulin  (HCC)    FOLLOWED BY PCP   Urgency of urination    Wears glasses     Allergies  Allergen Reactions   Codeine Nausea Only    Current Facility-Administered Medications on File Prior to Encounter  Medication Dose Route Frequency Provider Last Rate Last Admin   sodium chloride  flush (NS) 0.9 % injection 10 mL  10 mL Intravenous PRN Cloretta Arley NOVAK, MD   10 mL at 03/30/24 1158   Current Outpatient Medications on File Prior to Encounter   Medication Sig Dispense Refill   acetaminophen  (TYLENOL ) 500 MG tablet Take 500 mg by mouth daily as needed for moderate pain (pain score 4-6) or headache.     amoxicillin -clavulanate (AUGMENTIN ) 500-125 MG tablet Take 1 tablet by mouth 2 (two) times daily.     atorvastatin  (LIPITOR) 40 MG tablet Take 40 mg by mouth at bedtime.     baclofen  (LIORESAL ) 10 MG tablet Take 1 tablet (10 mg total) by mouth 2 (two) times daily. 30 each 0   cefpodoxime  (VANTIN ) 200 MG tablet Take 1 tablet (200 mg total) by mouth 2 (two) times daily for 7 days. 14 tablet 0   clotrimazole -betamethasone  (LOTRISONE ) cream APPLY CREAM TOPICALLY ONCE DAILY TO HEELS *NEW PRESCRIPTION REQUEST* 45 g 11   Continuous Glucose Receiver (FREESTYLE LIBRE 3 READER) DEVI as directed.     Continuous Glucose Sensor (FREESTYLE LIBRE 3 SENSOR) MISC SMARTSIG:1 Every 2 Weeks     cyanocobalamin  (VITAMIN B12) 1000 MCG tablet Take 1,000 mcg by mouth daily.     gabapentin  (NEURONTIN ) 300 MG capsule Take 300-600 mg by mouth See admin instructions. Take one capsule (300 mg) by mouth every morning and two capsules (600 mg) at night     Homeopathic Products (LEG CRAMPS) TABS Take 1 tablet by mouth 2 (two) times daily as needed (leg pain).  leptospermum manuka honey (MEDIHONEY) PSTE paste Apply 1 Application topically daily. 15 mL 0   levothyroxine  (SYNTHROID ) 137 MCG tablet Take 137 mcg by mouth daily before breakfast.     lidocaine -prilocaine  (EMLA ) cream APPLY 1/2 (ONE-HALF) TABLESPOON TO PORT SITE 1 TO 2 HOURS PRIOR TO STICK AND COVER WITH PRESS-AND-SEAL TO NUMB SITE. *NEW PRESCRIPTION REQUEST* 30 g 2   magnesium  oxide (MAG-OX) 400 MG tablet TAKE TWO (2) TABLETS BY MOUTH TWICE DAILY *NEW PRESCRIPTION REQUEST* 360 tablet 1   MAGnesium -Oxide 400 (240 Mg) MG tablet Take 2 tablets (800 mg total) by mouth 2 (two) times daily. 120 tablet 0   MELATONIN PO Take 1 tablet by mouth at bedtime.     metFORMIN  (GLUCOPHAGE ) 500 MG tablet Take 500 mg by mouth 2  (two) times daily with a meal.     nystatin  (NYAMYC ) powder APPLY POWDER TOPICALLY TWICE DAILY TO GROIN RASH 15 g 5   oxyCODONE  (ROXICODONE ) 5 MG immediate release tablet Take 1 tablet (5 mg total) by mouth every 4 (four) hours as needed for severe pain (pain score 7-10). 20 tablet 0   oxyCODONE  ER (XTAMPZA  ER) 9 MG C12A Take 9 mg by mouth in the morning and at bedtime.     pantoprazole  (PROTONIX ) 40 MG tablet Take 1 tablet (40 mg total) by mouth daily. 90 tablet 0   sertraline  (ZOLOFT ) 50 MG tablet Take 50 mg by mouth at bedtime.     TRESIBA  FLEXTOUCH 100 UNIT/ML FlexTouch Pen Inject 40 Units into the skin at bedtime.      Physical Exam: Vitals:   05/26/24 1802 05/26/24 1815  BP: (!) 191/63 (!) 174/58  Pulse: 79 81  Resp: (!) 22   Temp: 98.8 F (37.1 C)   SpO2: 96% 92%   Body mass index is 30.14 kg/m. Alert and oriented x 3 No shortness of breath or chest pain Abdomen is soft and nontender.  No rebound tenderness Musculoskeletal exam: Deformity noted of the left distal femur.  Skin is intact.  No abrasion or contusion noted.  Limited range of motion due to pain.  Left heel ulcer is noted. Full range of motion in the upper extremity and right lower extremity.  No obvious deformity or pain. No focal neurological deficits on motor or sensory testing in the lower extremity.   Image: DG Tibia/Fibula Left Result Date: 05/26/2024 CLINICAL DATA:  Fall. EXAM: LEFT TIBIA AND FIBULA - 2 VIEW COMPARISON:  None Available. FINDINGS: Only AP image could be obtained. There is no evidence of fracture or other focal bone lesions. Soft tissues are unremarkable. IMPRESSION: No definite abnormality seen on this AP image only. Electronically Signed   By: Lynwood Landy Raddle M.D.   On: 05/26/2024 17:29   DG FEMUR MIN 2 VIEWS LEFT Result Date: 05/26/2024 CLINICAL DATA:  Fall. EXAM: LEFT FEMUR 2 VIEWS COMPARISON:  None Available. FINDINGS: Status post left total hip arthroplasty. Moderately angulated fracture  of the midshaft of left femur is noted. IMPRESSION: Moderately angulated midshaft fracture of left femur. Electronically Signed   By: Lynwood Landy Raddle M.D.   On: 05/26/2024 17:28   DG Pelvis 1-2 Views Result Date: 05/26/2024 CLINICAL DATA:  Fall. EXAM: PELVIS - 1-2 VIEW COMPARISON:  September 05, 2021. FINDINGS: Status post left total hip arthroplasty. No fracture or dislocation is noted. IMPRESSION: No acute abnormality seen. Electronically Signed   By: Lynwood Landy Raddle M.D.   On: 05/26/2024 17:27   DG Chest 1 View Result Date: 05/26/2024  CLINICAL DATA:  Weakness and fall. EXAM: CHEST  1 VIEW COMPARISON:  Chest radiograph dated 10/14/2023. FINDINGS: Right-sided Port-A-Cath with tip at the cavoatrial junction. No focal consolidation, pleural effusion, pneumothorax. Stable cardiac silhouette. No acute osseous pathology. IMPRESSION: No active disease. Electronically Signed   By: Vanetta Chou M.D.   On: 05/26/2024 17:24   CT ABDOMEN PELVIS W CONTRAST Result Date: 05/21/2024 CLINICAL DATA:  Right flank pain EXAM: CT ABDOMEN AND PELVIS WITH CONTRAST TECHNIQUE: Multidetector CT imaging of the abdomen and pelvis was performed using the standard protocol following bolus administration of intravenous contrast. RADIATION DOSE REDUCTION: This exam was performed according to the departmental dose-optimization program which includes automated exposure control, adjustment of the mA and/or kV according to patient size and/or use of iterative reconstruction technique. CONTRAST:  OMNIPAQUE  IOHEXOL  300 MG/ML  SOLN COMPARISON:  10/12/2023 FINDINGS: Lower chest: No acute findings Hepatobiliary: No focal hepatic abnormality. Gallbladder unremarkable. Pancreas: No focal abnormality or ductal dilatation. Spleen: No focal abnormality.  Normal size. Adrenals/Urinary Tract: Adrenal glands are normal. Bilateral hydronephrosis, right greater than left. The urinary bladder is decompressed and the distal ureters are obscured by  beam hardening artifact from left hip replacement, but no visible obstructing stone. No renal mass. Stomach/Bowel: Stomach and small bowel decompressed, unremarkable. Left lower quadrant diverting loop colostomy noted. Suggestion of low-density rectal mass again noted, difficult to visualize due to beam hardening artifact from left hip replacement, measuring approximately 4.5 x 3.0 cm. Vascular/Lymphatic: Diffuse aortoiliac atherosclerosis. No evidence of aneurysm or adenopathy. Reproductive: Prior hysterectomy.  No adnexal masses. Other: No free fluid or free air. Musculoskeletal: Prior left hip replacement. No aggressive appearing focal bone lesion or acute bony abnormality. IMPRESSION: Bilateral hydronephrosis, right greater than left. The ureters are dilated into the pelvis without visible obstructing stone. However, the distal course of the ureters as well as the urinary bladder are obscured by beam hardening artifact from left hip replacement. There appears to be a low-density mass in the region of the rectum, also partially obscured by beam hardening artifact. This could be related to the patient's rectal cancer and the cause of the bilateral hydronephrosis. Left lower quadrant loop diverting colostomy noted. No evidence of bowel obstruction. As seen before, concern for rectal mass as described above, partially obscured by beam hardening artifact from left hip replacement. Aortic atherosclerosis. Electronically Signed   By: Franky Crease M.D.   On: 05/21/2024 15:16    A/P: Ta is a very pleasant 78 year old woman who had a mechanical fall and has a left distal femur fracture.  Patient also has a left heel ulcer that is currently being treated at wound care and vascular surgery services.  Orthopedics was consulted for management of her left femur fracture.  Patient has a closed left femur injury consistent that will ultimately require an ORIF.  Compartments are soft and nontender and there is no  laceration or abrasion at the fracture site.  I have discussed the case with the Ortho specialist Dr. Celena.  Plan on placing the patient in Buck's traction and transferring to [facility] for definitive ORIF of the distal femur.  Patient will be evaluated by the medical service to ensure that she is optimized for surgical intervention.  Will obtain a CT scan for better visualization and surgical planning.  I did also speak with Dr. Gretta from vascular surgery.  They are aware of her admission and will discuss management of her nonhealing heel ulcer.    Will plan to  keep her n.p.o. after midnight tonight and surgery tomorrow morning provided there is no medical issues that would prohibit fixation.  I have explained this plan to the patient and she is in agreement.

## 2024-05-26 NOTE — ED Provider Notes (Signed)
 [place] [facility] AT [facility] Provider Note   CSN: [id number] Arrival date & time: 05/26/24  1431     Patient presents with: [name]   [name] is a 78 y.o. female.   HPI Patient presents with her son who assists with history. Patient with history of endometrial cancer, now in recurrence.  After going for a checkup, soon after returning home patient had a mechanical fall.  She was caught by her son.  She had 1 similar event, also without complete fall.  However, soon thereafter the patient fell to the ground, striking left knee and send has had severe pain in left leg, not been ambulatory.  No head trauma, no loss of consciousness, no weakness in the left lower extremity.  Patient has baseline erythematous changes in the left calf, these are worsening.    Prior to Admission medications   Medication Sig Start Date End Date Taking? Authorizing Provider  acetaminophen  (TYLENOL ) 500 MG tablet Take 500 mg by mouth daily as needed for moderate pain (pain score 4-6) or headache.   Yes [provider]  atorvastatin  (LIPITOR) 40 MG tablet Take 40 mg by mouth at bedtime. 12/15/19  Yes [provider]  baclofen  (LIORESAL ) 10 MG tablet Take 1 tablet (10 mg total) by mouth 2 (two) times daily. 10/19/23  Yes Dezii, Alexandra, DO  busPIRone (BUSPAR) 5 MG tablet Take 5 mg by mouth 2 (two) times daily as needed (for anxiety).   Yes [provider]  CALPROTECT 0.44-20.6 % OINT Apply 1 application  topically daily as needed (for irritation- affected sites).   Yes [provider]  cefpodoxime  (VANTIN ) 200 MG tablet Take 1 tablet (200 mg total) by mouth 2 (two) times daily for 7 days. 05/21/24 05/28/24 Yes Mannie Pac T, DO  Cholecalciferol (VITAMIN D3 SUPER STRENGTH) 50 MCG (2000 UT) CAPS Take 2,000 Units by mouth daily.   Yes [provider]  clotrimazole -betamethasone  (LOTRISONE ) cream APPLY CREAM TOPICALLY ONCE DAILY TO HEELS  *NEW PRESCRIPTION REQUEST* Patient taking differently: Apply 1 Application topically See admin instructions. APPLY CREAM TOPICALLY ONCE DAILY TO HEELS 05/18/24  Yes McDonald, Juliene SAUNDERS, DPM  Continuous Glucose Sensor (FREESTYLE LIBRE 3 SENSOR) MISC Inject 1 Device into the skin every 14 (fourteen) days. 01/24/24  Yes [provider]  cyanocobalamin  (VITAMIN B12) 1000 MCG tablet Take 1,000 mcg by mouth daily.   Yes [provider]  EUTHYROX  137 MCG tablet Take 137 mcg by mouth daily before breakfast.   Yes [provider]  ferrous sulfate  325 (65 FE) MG tablet Take 325 mg by mouth See admin instructions. Take 325 mg by mouth with food on Mon/Wed/Fri   Yes [provider]  gabapentin  (NEURONTIN ) 300 MG capsule Take 300-600 mg by mouth See admin instructions. Take 600 mg by mouth in the morning and 300 mg at bedtime   Yes [provider]  Homeopathic Products (LEG CRAMPS) TABS Take 1 tablet by mouth 2 (two) times daily as needed (leg pain).   Yes [provider]  lidocaine -prilocaine  (EMLA ) cream APPLY 1/2 (ONE-HALF) TABLESPOON TO PORT SITE 1 TO 2 HOURS PRIOR TO STICK AND COVER WITH PRESS-AND-SEAL TO NUMB SITE. *NEW PRESCRIPTION REQUEST* Patient taking differently: Apply 1 Application topically See admin instructions. APPLY 1/2 (ONE-HALF) TABLESPOONFUL TO PORT SITE 1 TO 2 HOURS PRIOR TO STICK AND COVER WITH PRESS-AND-SEAL TO NUMB SITE 05/18/24  Yes Cloretta Arley NOVAK, MD  metFORMIN  (GLUCOPHAGE -XR) 750 MG 24 hr tablet Take 750  mg by mouth 2 (two) times daily with a meal.   Yes [provider]  Multiple Vitamin (MULTIVITAMIN) tablet Take 1 tablet by mouth daily with breakfast.   Yes [provider]  nystatin  (NYAMYC ) powder APPLY POWDER TOPICALLY TWICE DAILY TO GROIN RASH Patient taking differently: Apply 1 Application topically 2 (two) times daily as needed (for groin rashes). 05/19/24  Yes Cloretta Arley NOVAK, MD  pantoprazole  (PROTONIX ) 40 MG tablet  Take 1 tablet (40 mg total) by mouth daily. Patient taking differently: Take 40 mg by mouth daily before breakfast. 05/18/24  Yes Cloretta Arley NOVAK, MD  sertraline  (ZOLOFT ) 100 MG tablet Take 150 mg by mouth at bedtime.   Yes [provider]  XTAMPZA  ER 9 MG C12A Take 9 mg by mouth every 12 (twelve) hours.   Yes [provider]  Continuous Glucose Receiver (FREESTYLE LIBRE 3 READER) DEVI as directed. 01/28/24   [provider]  magnesium  oxide (MAG-OX) 400 MG tablet TAKE TWO (2) TABLETS BY MOUTH TWICE DAILY *NEW PRESCRIPTION REQUEST* Patient not taking: Reported on 05/26/2024 05/18/24   Cloretta Arley NOVAK, MD  MAGnesium -Oxide 400 (240 Mg) MG tablet Take 2 tablets (800 mg total) by mouth 2 (two) times daily. Patient not taking: Reported on 05/26/2024 05/18/24   Cloretta Arley NOVAK, MD  oxyCODONE  (ROXICODONE ) 5 MG immediate release tablet Take 1 tablet (5 mg total) by mouth every 4 (four) hours as needed for severe pain (pain score 7-10). Patient not taking: Reported on 05/26/2024 05/21/24   Mannie Pac T, DO    Allergies: Codeine    Review of Systems  Updated Vital Signs BP (!) 159/55 (BP Location: Left Arm)   Pulse 82   Temp 99.2 F (37.3 C) (Oral)   Resp 19   Ht 5' (1.524 m)   Wt 70 kg   SpO2 96%   BMI 30.14 kg/m   Physical Exam Vitals and nursing note reviewed.  Constitutional:      General: She is not in acute distress.    Appearance: She is well-developed.  HENT:     Head: Normocephalic and atraumatic.  Eyes:     Conjunctiva/sclera: Conjunctivae normal.  Cardiovascular:     Rate and Rhythm: Normal rate and regular rhythm.  Pulmonary:     Effort: Pulmonary effort is normal. No respiratory distress.     Breath sounds: No stridor.  Abdominal:     General: There is no distension.  Musculoskeletal:     Comments: Left leg with shortening, unwillingness to move the knee, hip secondary to pain. Left proximal foot with Ace wrap in place.  Left calf with  erythematous changes consistent with cellulitis.  Skin:    General: Skin is warm and dry.  Neurological:     Mental Status: She is alert and oriented to person, place, and time.     Cranial Nerves: No cranial nerve deficit.  Psychiatric:        Mood and Affect: Mood normal.     (all labs ordered are listed, but only abnormal results are displayed) Labs Reviewed  BASIC METABOLIC PANEL WITH GFR - Abnormal; Notable for the following components:      Result Value   Glucose, Bld 196 (*)    Creatinine, Ser 1.12 (*)    Calcium  8.4 (*)    GFR, Estimated 50 (*)    All other components within normal limits  CBC WITH DIFFERENTIAL/PLATELET - Abnormal; Notable for the following components:   RBC 3.60 (*)  Hemoglobin 8.8 (*)    HCT 30.8 (*)    MCH 24.4 (*)    MCHC 28.6 (*)    RDW 15.8 (*)    Neutro Abs 8.9 (*)    All other components within normal limits  PROTIME-INR - Abnormal; Notable for the following components:   Prothrombin Time 15.4 (*)    All other components within normal limits  MAGNESIUM  - Abnormal; Notable for the following components:   Magnesium  1.1 (*)    All other components within normal limits  HEMOGLOBIN A1C  MAGNESIUM   COMPREHENSIVE METABOLIC PANEL WITH GFR  CBC  VITAMIN D  25 HYDROXY (VIT D DEFICIENCY, FRACTURES)  TYPE AND SCREEN    EKG: None  Radiology: CT FEMUR LEFT WO CONTRAST Result Date: 05/26/2024 CLINICAL DATA:  Femur fracture EXAM: CT OF THE LOWER LEFT EXTREMITY WITHOUT CONTRAST TECHNIQUE: Multidetector CT imaging of the lower left extremity was performed according to the standard protocol. RADIATION DOSE REDUCTION: This exam was performed according to the departmental dose-optimization program which includes automated exposure control, adjustment of the mA and/or kV according to patient size and/or use of iterative reconstruction technique. COMPARISON:  Plain films today FINDINGS: Bones/Joint/Cartilage Prior left hip replacement. There is a fracture  through the midshaft of the left femur just below the hip prosthesis stem. Angulation and displacement. Ligaments Suboptimally assessed by CT. Muscles and Tendons Negative Soft tissues Edema throughout the subcutaneous soft tissues. IMPRESSION: Angulated and displaced midshaft left femoral fracture just below the left femoral prosthesis. Prior left hip replacement. Electronically Signed   By: Franky Crease M.D.   On: 05/26/2024 23:37   DG Tibia/Fibula Left Result Date: 05/26/2024 CLINICAL DATA:  Fall. EXAM: LEFT TIBIA AND FIBULA - 2 VIEW COMPARISON:  None Available. FINDINGS: Only AP image could be obtained. There is no evidence of fracture or other focal bone lesions. Soft tissues are unremarkable. IMPRESSION: No definite abnormality seen on this AP image only. Electronically Signed   By: Lynwood Landy Raddle M.D.   On: 05/26/2024 17:29   DG FEMUR MIN 2 VIEWS LEFT Result Date: 05/26/2024 CLINICAL DATA:  Fall. EXAM: LEFT FEMUR 2 VIEWS COMPARISON:  None Available. FINDINGS: Status post left total hip arthroplasty. Moderately angulated fracture of the midshaft of left femur is noted. IMPRESSION: Moderately angulated midshaft fracture of left femur. Electronically Signed   By: Lynwood Landy Raddle M.D.   On: 05/26/2024 17:28   DG Pelvis 1-2 Views Result Date: 05/26/2024 CLINICAL DATA:  Fall. EXAM: PELVIS - 1-2 VIEW COMPARISON:  September 05, 2021. FINDINGS: Status post left total hip arthroplasty. No fracture or dislocation is noted. IMPRESSION: No acute abnormality seen. Electronically Signed   By: Lynwood Landy Raddle M.D.   On: 05/26/2024 17:27   DG Chest 1 View Result Date: 05/26/2024 CLINICAL DATA:  Weakness and fall. EXAM: CHEST  1 VIEW COMPARISON:  Chest radiograph dated 10/14/2023. FINDINGS: Right-sided Port-A-Cath with tip at the cavoatrial junction. No focal consolidation, pleural effusion, pneumothorax. Stable cardiac silhouette. No acute osseous pathology. IMPRESSION: No active disease. Electronically Signed    By: Vanetta Chou M.D.   On: 05/26/2024 17:24     Procedures   Medications Ordered in the ED  HYDROmorphone  (DILAUDID ) injection 1 mg (1 mg Intravenous Given 05/26/24 2305)  oxyCODONE  (OXYCONTIN ) 12 hr tablet 10 mg (10 mg Oral Given 05/26/24 2204)  acetaminophen  (TYLENOL ) tablet 650 mg (has no administration in time range)    Or  acetaminophen  (TYLENOL ) suppository 650 mg (has no administration in time  range)  senna-docusate (Senokot-S) tablet 1 tablet (has no administration in time range)  ondansetron  (ZOFRAN ) tablet 4 mg (has no administration in time range)    Or  ondansetron  (ZOFRAN ) injection 4 mg (has no administration in time range)  feeding supplement (ENSURE PLUS HIGH PROTEIN) liquid 237 mL (237 mLs Oral Given 05/26/24 2049)  atorvastatin  (LIPITOR) tablet 40 mg (40 mg Oral Given 05/26/24 2203)  sertraline  (ZOLOFT ) tablet 150 mg (150 mg Oral Given 05/26/24 2203)  levothyroxine  (SYNTHROID ) tablet 137 mcg (has no administration in time range)  pantoprazole  (PROTONIX ) EC tablet 40 mg (has no administration in time range)  cyanocobalamin  (VITAMIN B12) tablet 1,000 mcg (has no administration in time range)  ferrous sulfate  tablet 325 mg (has no administration in time range)  baclofen  (LIORESAL ) tablet 10 mg (10 mg Oral Given 05/26/24 2204)  gabapentin  (NEURONTIN ) capsule 300 mg (300 mg Oral Given 05/26/24 2203)  cholecalciferol (VITAMIN D3) 25 MCG (1000 UNIT) tablet 2,000 Units (has no administration in time range)  multivitamin with minerals tablet 1 tablet (has no administration in time range)  clotrimazole  (LOTRIMIN ) 1 % cream (has no administration in time range)  gabapentin  (NEURONTIN ) capsule 600 mg (has no administration in time range)  insulin  aspart (novoLOG ) injection 0-15 Units (has no administration in time range)  ceFAZolin  (ANCEF ) IVPB 1 g/50 mL premix (1 g Intravenous New Bag/Given 05/26/24 2211)  hydrALAZINE  (APRESOLINE ) injection 10 mg (has no administration in time  range)  magnesium  sulfate IVPB 4 g 100 mL (4 g Intravenous New Bag/Given 05/26/24 2341)  0.9 %  sodium chloride  infusion ( Intravenous New Bag/Given 05/26/24 2343)  fentaNYL  (SUBLIMAZE ) injection 100 mcg (100 mcg Intravenous Given 05/26/24 1844)  HYDROmorphone  (DILAUDID ) injection 1 mg (1 mg Intravenous Given 05/26/24 1949)                                    Medical Decision Making Elderly female, multiple medical issues including chronic edema, diabetes, hypertension, now presents after fall with inability to bear weight, pain in left side, concern for fracture versus soft tissue injury.  Patient also has evidence for cellulitis, this is unlikely to be causing nonambulatory status.  Labs fluids meds ordered.  Amount and/or Complexity of Data Reviewed Independent Historian: caregiver External Data Reviewed: notes. Labs: ordered. Decision-making details documented in ED Course. Radiology: ordered and independent interpretation performed. Decision-making details documented in ED Course. ECG/medicine tests: ordered and independent interpretation performed. Decision-making details documented in ED Course.  Risk Prescription drug management. Decision regarding hospitalization. Diagnosis or treatment significantly limited by social determinants of health.   Update: On repeat exam patient aware of femur fracture just distal to the prosthetic.  She is otherwise without new complaints, is awake, alert.  I discussed findings with her and her son, and subsequently with her surgery team.  Patient will require transfer to our affiliated center for anticipated surgical repair of her femur fracture tomorrow.  With concern for femur fracture, Buck's traction ordered, additional analgesics after 2 rounds of fentanyl  did not result in substantial improvement.     Final diagnoses:  Fall, initial encounter  Closed displaced oblique fracture of shaft of left femur, initial encounter [facility])    ED Discharge  Orders     None          Garrick Charleston, MD 05/26/24 2352

## 2024-05-26 NOTE — ED Notes (Signed)
 This RN called [facility] for transport to [facility]

## 2024-05-26 NOTE — ED Triage Notes (Signed)
 Patient brought in from home via [facility], fell at home was not able to get up on own. Patient fell on knee and heard a pop. Hx multiple fractured hips. 200 mcg fentanyl  received from EMS.

## 2024-05-26 NOTE — ED Notes (Signed)
 This RN gave report to receiving nurse at [facility], [name].

## 2024-05-26 NOTE — Progress Notes (Signed)
 Orthopedic Tech Progress Note Patient Details:  [name] 09-17-1946 [id number]  Patient ID: [name], female   DOB: 03/03/1946, 78 y.o.   MRN: [id number] RN to message back via secure chat once patient has been moved from a stretcher to a hospital bed, traction will be applied then. Laymon DELENA Munroe 05/26/2024, 6:07 PM

## 2024-05-27 ENCOUNTER — Other Ambulatory Visit: Payer: Self-pay

## 2024-05-27 ENCOUNTER — Inpatient Hospital Stay (HOSPITAL_COMMUNITY)

## 2024-05-27 ENCOUNTER — Inpatient Hospital Stay (HOSPITAL_COMMUNITY): Admitting: Anesthesiology

## 2024-05-27 ENCOUNTER — Encounter: Payer: Self-pay | Admitting: Podiatry

## 2024-05-27 ENCOUNTER — Encounter (HOSPITAL_COMMUNITY): Payer: Self-pay | Admitting: Student

## 2024-05-27 ENCOUNTER — Encounter (HOSPITAL_COMMUNITY)
Admission: EM | Disposition: A | Discharge status: Skilled Nursing Facility | Payer: Self-pay | Source: Home / Self Care | Attending: Family Medicine

## 2024-05-27 DIAGNOSIS — S72402A Unspecified fracture of lower end of left femur, initial encounter for closed fracture: Secondary | ICD-10-CM

## 2024-05-27 DIAGNOSIS — Z87891 Personal history of nicotine dependence: Secondary | ICD-10-CM | POA: Diagnosis not present

## 2024-05-27 DIAGNOSIS — W19XXXA Unspecified fall, initial encounter: Secondary | ICD-10-CM | POA: Diagnosis not present

## 2024-05-27 DIAGNOSIS — L97429 Non-pressure chronic ulcer of left heel and midfoot with unspecified severity: Secondary | ICD-10-CM

## 2024-05-27 DIAGNOSIS — L03115 Cellulitis of right lower limb: Secondary | ICD-10-CM | POA: Diagnosis not present

## 2024-05-27 DIAGNOSIS — I110 Hypertensive heart disease with heart failure: Secondary | ICD-10-CM | POA: Diagnosis not present

## 2024-05-27 DIAGNOSIS — I5032 Chronic diastolic (congestive) heart failure: Secondary | ICD-10-CM

## 2024-05-27 DIAGNOSIS — S7290XA Unspecified fracture of unspecified femur, initial encounter for closed fracture: Secondary | ICD-10-CM

## 2024-05-27 DIAGNOSIS — E11621 Type 2 diabetes mellitus with foot ulcer: Secondary | ICD-10-CM | POA: Diagnosis not present

## 2024-05-27 DIAGNOSIS — E119 Type 2 diabetes mellitus without complications: Secondary | ICD-10-CM

## 2024-05-27 DIAGNOSIS — S7292XA Unspecified fracture of left femur, initial encounter for closed fracture: Secondary | ICD-10-CM | POA: Diagnosis not present

## 2024-05-27 HISTORY — PX: ORIF FEMUR FRACTURE: SHX2119

## 2024-05-27 LAB — COMPREHENSIVE METABOLIC PANEL WITH GFR
ALT: 15 U/L (ref 0–44)
AST: 14 U/L — ABNORMAL LOW (ref 15–41)
Albumin: 2.1 g/dL — ABNORMAL LOW (ref 3.5–5.0)
Alkaline Phosphatase: 79 U/L (ref 38–126)
Anion gap: 9 (ref 5–15)
BUN: 13 mg/dL (ref 8–23)
CO2: 25 mmol/L (ref 22–32)
Calcium: 8 mg/dL — ABNORMAL LOW (ref 8.9–10.3)
Chloride: 108 mmol/L (ref 98–111)
Creatinine, Ser: 0.92 mg/dL (ref 0.44–1.00)
GFR, Estimated: >60 mL/min (ref >60)
Glucose, Bld: 121 mg/dL — ABNORMAL HIGH (ref 70–99)
Potassium: 3.7 mmol/L (ref 3.5–5.1)
Sodium: 142 mmol/L (ref 135–145)
Total Bilirubin: 0.5 mg/dL (ref 0.0–1.2)
Total Protein: 5 g/dL — ABNORMAL LOW (ref 6.5–8.1)

## 2024-05-27 LAB — CBC
HCT: 25.1 % — ABNORMAL LOW (ref 36.0–46.0)
Hemoglobin: 7.2 g/dL — ABNORMAL LOW (ref 12.0–15.0)
MCH: 24.3 pg — ABNORMAL LOW (ref 26.0–34.0)
MCHC: 28.7 g/dL — ABNORMAL LOW (ref 30.0–36.0)
MCV: 84.8 fL (ref 80.0–100.0)
Platelets: 121 K/uL — ABNORMAL LOW (ref 150–400)
RBC: 2.96 MIL/uL — ABNORMAL LOW (ref 3.87–5.11)
RDW: 15.8 % — ABNORMAL HIGH (ref 11.5–15.5)
WBC: 5.9 K/uL (ref 4.0–10.5)
nRBC: 0 % (ref 0.0–0.2)

## 2024-05-27 LAB — VITAMIN D 25 HYDROXY (VIT D DEFICIENCY, FRACTURES): Vit D, 25-Hydroxy: 28.18 ng/mL — ABNORMAL LOW (ref 30–100)

## 2024-05-27 LAB — HEMOGLOBIN A1C
Hgb A1c MFr Bld: 6.2 % — ABNORMAL HIGH (ref 4.8–5.6)
Mean Plasma Glucose: 131 mg/dL

## 2024-05-27 LAB — MAGNESIUM: Magnesium: 2.2 mg/dL (ref 1.7–2.4)

## 2024-05-27 LAB — GLUCOSE, CAPILLARY
Glucose-Capillary: 106 mg/dL — ABNORMAL HIGH (ref 70–99)
Glucose-Capillary: 112 mg/dL — ABNORMAL HIGH (ref 70–99)
Glucose-Capillary: 135 mg/dL — ABNORMAL HIGH (ref 70–99)
Glucose-Capillary: 213 mg/dL — ABNORMAL HIGH (ref 70–99)
Glucose-Capillary: 220 mg/dL — ABNORMAL HIGH (ref 70–99)
Glucose-Capillary: 86 mg/dL (ref 70–99)

## 2024-05-27 LAB — PREPARE RBC (CROSSMATCH)

## 2024-05-27 LAB — SURGICAL PCR SCREEN
MRSA, PCR: NEGATIVE
Staphylococcus aureus: NEGATIVE

## 2024-05-27 SURGERY — OPEN REDUCTION INTERNAL FIXATION (ORIF) DISTAL FEMUR FRACTURE
Anesthesia: General | Laterality: Left

## 2024-05-27 MED ORDER — OXYCODONE HCL 5 MG PO TABS: 5.0000 mg | ORAL_TABLET | Freq: Once | ORAL | Status: DC | PRN

## 2024-05-27 MED ORDER — ORAL CARE MOUTH RINSE: 15.0000 mL | Freq: Once | OROMUCOSAL | Status: AC

## 2024-05-27 MED ORDER — SUGAMMADEX SODIUM 200 MG/2ML IV SOLN
INTRAVENOUS | Status: AC
Start: 2024-05-27 — End: 2024-05-27
  Filled 2024-05-27: qty 2

## 2024-05-27 MED ORDER — VANCOMYCIN HCL 1000 MG IV SOLR
INTRAVENOUS | Status: AC
  Filled 2024-05-27: qty 20

## 2024-05-27 MED ORDER — ONDANSETRON HCL 4 MG/2ML IJ SOLN: 4.0000 mg | Freq: Four times a day (QID) | INTRAMUSCULAR | Status: DC | PRN

## 2024-05-27 MED ORDER — OXYCODONE HCL 5 MG/5ML PO SOLN: 5.0000 mg | Freq: Once | ORAL | Status: DC | PRN

## 2024-05-27 MED ORDER — CHLORHEXIDINE GLUCONATE 4 % EX SOLN: 60.0000 mL | Freq: Once | CUTANEOUS | Status: DC

## 2024-05-27 MED ORDER — ROCURONIUM BROMIDE 10 MG/ML (PF) SYRINGE
PREFILLED_SYRINGE | INTRAVENOUS | Status: DC | PRN
  Administered 2024-05-27: 50 mg via INTRAVENOUS

## 2024-05-27 MED ORDER — LABETALOL HCL 5 MG/ML IV SOLN
10.0000 mg | INTRAVENOUS | Status: DC | PRN
  Administered 2024-05-27: 10 mg via INTRAVENOUS

## 2024-05-27 MED ORDER — VANCOMYCIN HCL 1000 MG IV SOLR
INTRAVENOUS | Status: DC | PRN
  Administered 2024-05-27: 1000 mg via TOPICAL

## 2024-05-27 MED ORDER — PROPOFOL 10 MG/ML IV BOLUS
INTRAVENOUS | Status: AC
  Filled 2024-05-27: qty 20

## 2024-05-27 MED ORDER — SUGAMMADEX SODIUM 200 MG/2ML IV SOLN
INTRAVENOUS | Status: DC | PRN
  Administered 2024-05-27: 150 mg via INTRAVENOUS

## 2024-05-27 MED ORDER — FENTANYL CITRATE (PF) 100 MCG/2ML IJ SOLN: 25.0000 ug | INTRAMUSCULAR | Status: DC | PRN

## 2024-05-27 MED ORDER — FUROSEMIDE 10 MG/ML IJ SOLN
INTRAMUSCULAR | Status: AC
  Filled 2024-05-27: qty 4

## 2024-05-27 MED ORDER — ONDANSETRON HCL 4 MG/2ML IJ SOLN
INTRAMUSCULAR | Status: DC | PRN
  Administered 2024-05-27: 4 mg via INTRAVENOUS

## 2024-05-27 MED ORDER — PROPOFOL 10 MG/ML IV BOLUS
INTRAVENOUS | Status: DC | PRN
  Administered 2024-05-27: 70 mg via INTRAVENOUS

## 2024-05-27 MED ORDER — FENTANYL CITRATE (PF) 250 MCG/5ML IJ SOLN
INTRAMUSCULAR | Status: DC | PRN
  Administered 2024-05-27 (×3): 50 ug via INTRAVENOUS

## 2024-05-27 MED ORDER — FENTANYL CITRATE (PF) 250 MCG/5ML IJ SOLN
INTRAMUSCULAR | Status: AC
  Filled 2024-05-27: qty 5

## 2024-05-27 MED ORDER — FUROSEMIDE 10 MG/ML IJ SOLN
20.0000 mg | Freq: Once | INTRAMUSCULAR | Status: AC
  Administered 2024-05-27: 20 mg via INTRAVENOUS

## 2024-05-27 MED ORDER — LIDOCAINE 2% (20 MG/ML) 5 ML SYRINGE
INTRAMUSCULAR | Status: AC
Start: 2024-05-27 — End: 2024-05-27
  Filled 2024-05-27: qty 5

## 2024-05-27 MED ORDER — SODIUM CHLORIDE 0.9% IV SOLUTION: Freq: Once | INTRAVENOUS | Status: DC

## 2024-05-27 MED ORDER — POVIDONE-IODINE 10 % EX SWAB
2.0000 | Freq: Once | CUTANEOUS | Status: AC
  Administered 2024-05-27: 2 via TOPICAL

## 2024-05-27 MED ORDER — HYDROMORPHONE HCL 1 MG/ML IJ SOLN
1.0000 mg | Freq: Once | INTRAMUSCULAR | Status: AC
  Administered 2024-05-27: 1 mg via INTRAVENOUS
  Filled 2024-05-27: qty 1

## 2024-05-27 MED ORDER — CHLORHEXIDINE GLUCONATE 0.12 % MT SOLN
OROMUCOSAL | Status: AC
  Administered 2024-05-27: 15 mL via OROMUCOSAL
  Filled 2024-05-27: qty 15

## 2024-05-27 MED ORDER — 0.9 % SODIUM CHLORIDE (POUR BTL) OPTIME
TOPICAL | Status: DC | PRN
  Administered 2024-05-27: 1000 mL

## 2024-05-27 MED ORDER — DEXAMETHASONE SODIUM PHOSPHATE 10 MG/ML IJ SOLN
INTRAMUSCULAR | Status: DC | PRN
  Administered 2024-05-27: 5 mg via INTRAVENOUS

## 2024-05-27 MED ORDER — LIDOCAINE 2% (20 MG/ML) 5 ML SYRINGE
INTRAMUSCULAR | Status: DC | PRN
  Administered 2024-05-27: 60 mg via INTRAVENOUS

## 2024-05-27 MED ORDER — LACTATED RINGERS IV SOLN: INTRAVENOUS | Status: DC

## 2024-05-27 MED ORDER — MEDIHONEY WOUND/BURN DRESSING EX PSTE
1.0000 | PASTE | Freq: Every day | CUTANEOUS | Status: DC
  Administered 2024-05-31 – 2024-06-08 (×7): 1 via TOPICAL
  Filled 2024-05-27 (×4): qty 44

## 2024-05-27 MED ORDER — CEFAZOLIN SODIUM-DEXTROSE 2-4 GM/100ML-% IV SOLN
INTRAVENOUS | Status: AC
  Filled 2024-05-27: qty 100

## 2024-05-27 MED ORDER — CHLORHEXIDINE GLUCONATE 0.12 % MT SOLN: 15.0000 mL | Freq: Once | OROMUCOSAL | Status: AC

## 2024-05-27 MED ORDER — CEFAZOLIN SODIUM-DEXTROSE 2-3 GM-%(50ML) IV SOLR
INTRAVENOUS | Status: DC | PRN
  Administered 2024-05-27: 2 g via INTRAVENOUS

## 2024-05-27 MED ORDER — SODIUM CHLORIDE 0.9 % IV SOLN: 10.0000 mL/h | Freq: Once | INTRAVENOUS | Status: AC

## 2024-05-27 MED ORDER — LABETALOL HCL 5 MG/ML IV SOLN
INTRAVENOUS | Status: AC
  Filled 2024-05-27: qty 4

## 2024-05-27 MED ORDER — ROCURONIUM BROMIDE 10 MG/ML (PF) SYRINGE
PREFILLED_SYRINGE | INTRAVENOUS | Status: AC
Start: 2024-05-27 — End: 2024-05-27
  Filled 2024-05-27: qty 10

## 2024-05-27 MED ORDER — CHLORHEXIDINE GLUCONATE 0.12 % MT SOLN
OROMUCOSAL | Status: AC
  Filled 2024-05-27: qty 15

## 2024-05-27 SURGICAL SUPPLY — 59 items
BAG COUNTER SPONGE SURGICOUNT (BAG) ×1 IMPLANT
BIT DRILL 4.3X300MM (BIT) IMPLANT
BIT DRILL LONG 3.3 (BIT) IMPLANT
BIT DRILL QC 3.3X195 (BIT) IMPLANT
BLADE CLIPPER SURG (BLADE) IMPLANT
BNDG COHESIVE 6X5 TAN ST LF (GAUZE/BANDAGES/DRESSINGS) ×1 IMPLANT
BNDG ELASTIC 4INX 5YD STR LF (GAUZE/BANDAGES/DRESSINGS) IMPLANT
BNDG ELASTIC 6INX 5YD STR LF (GAUZE/BANDAGES/DRESSINGS) IMPLANT
BNDG ELASTIC 6X10 VLCR STRL LF (GAUZE/BANDAGES/DRESSINGS) ×1 IMPLANT
BRUSH SCRUB EZ PLAIN DRY (MISCELLANEOUS) ×2 IMPLANT
CABLE CERLAGE W/CRIMP 1.8 (Cable) IMPLANT
CANISTER SUCTION 3000ML PPV (SUCTIONS) ×1 IMPLANT
CAP LOCK NCB (Cap) IMPLANT
CHLORAPREP W/TINT 26 (MISCELLANEOUS) ×1 IMPLANT
COVER SURGICAL LIGHT HANDLE (MISCELLANEOUS) ×1 IMPLANT
DRAPE C-ARM 42X72 X-RAY (DRAPES) ×1 IMPLANT
DRAPE C-ARMOR (DRAPES) ×1 IMPLANT
DRAPE HALF SHEET 40X57 (DRAPES) ×2 IMPLANT
DRAPE SURG 17X23 STRL (DRAPES) ×1 IMPLANT
DRAPE SURG ORHT 6 SPLT 77X108 (DRAPES) ×2 IMPLANT
DRAPE U-SHAPE 47X51 STRL (DRAPES) ×1 IMPLANT
DRESSING MEPILEX FLEX 4X4 (GAUZE/BANDAGES/DRESSINGS) IMPLANT
DRSG ADAPTIC 3X8 NADH LF (GAUZE/BANDAGES/DRESSINGS) IMPLANT
DRSG MEPILEX POST OP 4X12 (GAUZE/BANDAGES/DRESSINGS) IMPLANT
DRSG MEPILEX POST OP 4X8 (GAUZE/BANDAGES/DRESSINGS) IMPLANT
ELECTRODE REM PT RTRN 9FT ADLT (ELECTROSURGICAL) ×1 IMPLANT
GAUZE PAD ABD 8X10 STRL (GAUZE/BANDAGES/DRESSINGS) ×3 IMPLANT
GAUZE SPONGE 4X4 12PLY STRL (GAUZE/BANDAGES/DRESSINGS) ×1 IMPLANT
GLOVE BIO SURGEON STRL SZ 6.5 (GLOVE) ×3 IMPLANT
GLOVE BIO SURGEON STRL SZ7.5 (GLOVE) ×4 IMPLANT
GLOVE BIOGEL PI IND STRL 6.5 (GLOVE) ×1 IMPLANT
GLOVE BIOGEL PI IND STRL 7.5 (GLOVE) ×1 IMPLANT
GOWN STRL REUS W/ TWL LRG LVL3 (GOWN DISPOSABLE) ×3 IMPLANT
KIT BASIN OR (CUSTOM PROCEDURE TRAY) ×1 IMPLANT
KIT TURNOVER KIT B (KITS) ×1 IMPLANT
KWIRE FXSTD 280X2XNS SS (WIRE) IMPLANT
NS IRRIG 1000ML POUR BTL (IV SOLUTION) ×1 IMPLANT
PACK TOTAL JOINT (CUSTOM PROCEDURE TRAY) ×1 IMPLANT
PAD ARMBOARD POSITIONER FOAM (MISCELLANEOUS) ×1 IMPLANT
PAD CAST 4YDX4 CTTN HI CHSV (CAST SUPPLIES) ×1 IMPLANT
PAD CAST CTTN 4X4 STRL (SOFTGOODS) IMPLANT
PADDING CAST COTTON 6X4 STRL (CAST SUPPLIES) ×1 IMPLANT
PLATE NCB 15H HIP (Plate) IMPLANT
SCREW 5.0 70MM (Screw) IMPLANT
SCREW NCB 3.5X75X5X6.2XST (Screw) IMPLANT
SCREW NCB 4.0MX42M (Screw) IMPLANT
SCREW NCB 4.0MX44M (Screw) IMPLANT
SPONGE T-LAP 18X18 ~~LOC~~+RFID (SPONGE) IMPLANT
STAPLER SKIN PROX 35W (STAPLE) ×1 IMPLANT
SUCTION TUBE FRAZIER 10FR DISP (SUCTIONS) ×1 IMPLANT
SUT ETHILON 3 0 PS 1 (SUTURE) ×2 IMPLANT
SUT MON AB 2-0 CT1 36 (SUTURE) IMPLANT
SUT MON AB 5-0 PS2 18 (SUTURE) IMPLANT
SUT VIC AB 0 CT1 27XBRD ANBCTR (SUTURE) IMPLANT
SUT VIC AB 1 CT1 27XBRD ANBCTR (SUTURE) IMPLANT
SUT VIC AB 2-0 CT1 TAPERPNT 27 (SUTURE) ×2 IMPLANT
TOWEL GREEN STERILE (TOWEL DISPOSABLE) ×2 IMPLANT
TRAY FOLEY MTR SLVR 16FR STAT (SET/KITS/TRAYS/PACK) IMPLANT
WATER STERILE IRR 1000ML POUR (IV SOLUTION) ×2 IMPLANT

## 2024-05-27 NOTE — Progress Notes (Signed)
 Orthopedic Tech Progress Note Patient Details:  [name] 1946/07/22 [id number]  Musculoskeletal Traction Type of Traction: Bucks Skin Traction Traction Location: LLE Traction Weight: 5 lbs   Post Interventions Patient Tolerated: Difficulty with ambulation, Poor Instructions Provided: Care of device There was difficulty facilitating leg placement. Patient was sensitive to the touch and unable to fully extend the knee into the foam.   [name] L Najae Rathert 05/27/2024, 12:32 AM

## 2024-05-27 NOTE — Consult Note (Signed)
 Hospital Consult     MRN #:  [id number]  History of Present Illness: This is a 78 y.o. female with history of diabetes, hyperlipidemia, basal ganglia stroke, partially obstructing rectal cancer status post colostomy that vascular surgery was engaged for possible angiogram while she is admitted.  Seen in the office on Tuesday with necrotic left heel wound that has gotten much worse. Plan was outpatient angiogram.  Now admitted due to femur fracture after mechanical fall yesterday.  Having surgery today with orthopedic surgery today.  Past Medical History:  Diagnosis Date   Arthritis    Basal ganglia stroke [facility])    March 2022   Bilateral lower extremity edema    Cancer (HCC)    twice- colon cancer, unsure of the other type of cancer   Carotid artery occlusion    Flank pain    Full dentures    History of cervical cancer    11/ 1994  Stage IB  s/p  high dose radiation brachytherapy @ [facility] 01/ 1995--- per pt no recurrence   History of chronic bronchitis    History of hyperthyroidism    d 03/ 2011---due to grave's disease--- s/p  RAI treatment 04/ 2011   History of kidney stones    History of sepsis 05/03/2018   due to UTI with klebsiella/ pyelonephritis/ ureteral obstruction cause by stone   Hyperlipidemia    Hypothyroidism, postradioiodine therapy    endocrinologist--  dr kassie--  dx graves disease and s/p RAI i131 treatement 4/ 2011   Nauseated    Oral thrush 05/03/2018   Pneumonia    Type 2 diabetes mellitus treated with insulin  (HCC)    FOLLOWED BY PCP   Urgency of urination    Wears glasses     Past Surgical History:  Procedure Laterality Date   APPENDECTOMY     BIOPSY  01/23/2021   Procedure: BIOPSY;  Surgeon: Shila Gustav GAILS, MD;  Location: [facility];  Service: Endoscopy;;   BIOPSY  10/12/2021   Procedure: BIOPSY;  Surgeon: Eda Iha, MD;  Location: [facility];  Service: Gastroenterology;;   BIOPSY  09/11/2023   Procedure: BIOPSY;  Surgeon:  Charlanne Groom, MD;  Location: [facility];  Service: Gastroenterology;;   BIOPSY  10/13/2023   Procedure: BIOPSY;  Surgeon: Federico Rosario BROCKS, MD;  Location: [facility];  Service: Gastroenterology;;   CAROTID ENDARTERECTOMY Right 03/28/2020   CATARACT EXTRACTION W/ INTRAOCULAR LENS  IMPLANT, BILATERAL  2004  approx.   COLONOSCOPY     COLONOSCOPY WITH PROPOFOL  N/A 01/23/2021   Procedure: COLONOSCOPY WITH PROPOFOL ;  Surgeon: Shila Gustav GAILS, MD;  Location: [facility];  Service: Endoscopy;  Laterality: N/A;   CYSTOSCOPY WITH STENT PLACEMENT Right 05/04/2018   Procedure: CYSTOSCOPY WITH STENT PLACEMENT AND RETROGRADE PYELOGRAM;  Surgeon: Devere Lonni Righter, MD;  Location: [facility];  Service: Urology;  Laterality: Right;   CYSTOSCOPY/URETEROSCOPY/HOLMIUM LASER/STENT PLACEMENT Right 05/19/2018   Procedure: CYSTOSCOPY, URETEROSCOPY/HOLMIUM LASER, STONE BASKETRY/ STENT EXCHANGE;  Surgeon: Devere Lonni Righter, MD;  Location: [facility];  Service: Urology;  Laterality: Right;   ENDARTERECTOMY Right 03/28/2020   Procedure: RIGHT CAROTID ENDARTERECTOMY with PATCH ANGIOPLASTY;  Surgeon: Oris Krystal FALCON, MD;  Location: [facility];  Service: Vascular;  Laterality: Right;   ENTEROSCOPY N/A 10/13/2023   Procedure: ENTEROSCOPY;  Surgeon: Federico Rosario BROCKS, MD;  Location: [facility];  Service: Gastroenterology;  Laterality: N/A;   ESOPHAGOGASTRODUODENOSCOPY (EGD) WITH PROPOFOL  N/A 10/12/2021   Procedure: ESOPHAGOGASTRODUODENOSCOPY (EGD) WITH PROPOFOL ;  Surgeon: Eda Iha, MD;  Location: [facility];  Service: Gastroenterology;  Laterality: N/A;   ESOPHAGOGASTRODUODENOSCOPY (EGD) WITH PROPOFOL  N/A 09/11/2023   Procedure: ESOPHAGOGASTRODUODENOSCOPY (EGD) WITH PROPOFOL ;  Surgeon: Charlanne Groom, MD;  Location: [facility];  Service: Gastroenterology;  Laterality: N/A;   EXCISION MASS LEFT CHEST WALL  09-20-2007   dr sebastian  [facility]   HOT HEMOSTASIS  10/13/2023   Procedure: HOT  HEMOSTASIS (ARGON PLASMA COAGULATION/BICAP);  Surgeon: Federico Rosario BROCKS, MD;  Location: [facility];  Service: Gastroenterology;;   Radioactive Iodine  Therapy     for thyroid    REVISION TOTAL HIP ARTHROPLASTY Left early 2000s   SUBMUCOSAL TATTOO INJECTION  01/23/2021   Procedure: SUBMUCOSAL TATTOO INJECTION;  Surgeon: Shila Gustav GAILS, MD;  Location: [facility];  Service: Endoscopy;;   TANDEM RING INSERTION  1995   dr olean @ [facility]   EUA w/ tandem placement in ovid  (for direct high dose radiation brachytherapy , cervical cancer)   TOTAL HIP ARTHROPLASTY Left 1990s   TRANSTHORACIC ECHOCARDIOGRAM  08/07/2017   mild focal basal hypertrophy of the septum,  ef 60-65%, grade 1 diastolic dysfunction/  atrial septum with lipomatous hypertrophy/  trivial PR   UPPER GASTROINTESTINAL ENDOSCOPY      Allergies  Allergen Reactions   Codeine Hives and Nausea Only    Prior to Admission medications   Medication Sig Start Date End Date Taking? Authorizing Provider  acetaminophen  (TYLENOL ) 500 MG tablet Take 500 mg by mouth daily as needed for moderate pain (pain score 4-6) or headache.   Yes [provider]  atorvastatin  (LIPITOR) 40 MG tablet Take 40 mg by mouth at bedtime. 12/15/19  Yes [provider]  baclofen  (LIORESAL ) 10 MG tablet Take 1 tablet (10 mg total) by mouth 2 (two) times daily. 10/19/23  Yes Dezii, Alexandra, DO  busPIRone (BUSPAR) 5 MG tablet Take 5 mg by mouth 2 (two) times daily as needed (for anxiety).   Yes [provider]  CALPROTECT 0.44-20.6 % OINT Apply 1 application  topically daily as needed (for irritation- affected sites).   Yes [provider]  cefpodoxime  (VANTIN ) 200 MG tablet Take 1 tablet (200 mg total) by mouth 2 (two) times daily for 7 days. 05/21/24 05/28/24 Yes Mannie Pac T, DO  Cholecalciferol  (VITAMIN D3 SUPER STRENGTH) 50 MCG (2000 UT) CAPS Take 2,000 Units by mouth daily.   Yes [provider]   clotrimazole -betamethasone  (LOTRISONE ) cream APPLY CREAM TOPICALLY ONCE DAILY TO HEELS *NEW PRESCRIPTION REQUEST* Patient taking differently: Apply 1 Application topically See admin instructions. APPLY CREAM TOPICALLY ONCE DAILY TO HEELS 05/18/24  Yes McDonald, Juliene SAUNDERS, DPM  Continuous Glucose Sensor (FREESTYLE LIBRE 3 SENSOR) MISC Inject 1 Device into the skin every 14 (fourteen) days. 01/24/24  Yes [provider]  cyanocobalamin  (VITAMIN B12) 1000 MCG tablet Take 1,000 mcg by mouth daily.   Yes [provider]  EUTHYROX  137 MCG tablet Take 137 mcg by mouth daily before breakfast.   Yes [provider]  ferrous sulfate  325 (65 FE) MG tablet Take 325 mg by mouth See admin instructions. Take 325 mg by mouth with food on Mon/Wed/Fri   Yes [provider]  gabapentin  (NEURONTIN ) 300 MG capsule Take 300-600 mg by mouth See admin instructions. Take 600 mg by mouth in the morning and 300 mg at bedtime   Yes [provider]  Homeopathic Products (LEG CRAMPS) TABS Take 1 tablet by mouth 2 (two) times daily as needed (leg pain).   Yes [provider]  lidocaine -prilocaine  (EMLA )  cream APPLY 1/2 (ONE-HALF) TABLESPOON TO PORT SITE 1 TO 2 HOURS PRIOR TO STICK AND COVER WITH PRESS-AND-SEAL TO NUMB SITE. *NEW PRESCRIPTION REQUEST* Patient taking differently: Apply 1 Application topically See admin instructions. APPLY 1/2 (ONE-HALF) TABLESPOONFUL TO PORT SITE 1 TO 2 HOURS PRIOR TO STICK AND COVER WITH PRESS-AND-SEAL TO NUMB SITE 05/18/24  Yes Cloretta Arley NOVAK, MD  metFORMIN  (GLUCOPHAGE -XR) 750 MG 24 hr tablet Take 750 mg by mouth 2 (two) times daily with a meal.   Yes [provider]  Multiple Vitamin (MULTIVITAMIN) tablet Take 1 tablet by mouth daily with breakfast.   Yes [provider]  nystatin  (NYAMYC ) powder APPLY POWDER TOPICALLY TWICE DAILY TO GROIN RASH Patient taking differently: Apply 1 Application topically 2 (two) times daily as needed  (for groin rashes). 05/19/24  Yes Cloretta Arley NOVAK, MD  pantoprazole  (PROTONIX ) 40 MG tablet Take 1 tablet (40 mg total) by mouth daily. Patient taking differently: Take 40 mg by mouth daily before breakfast. 05/18/24  Yes Cloretta Arley NOVAK, MD  sertraline  (ZOLOFT ) 100 MG tablet Take 150 mg by mouth at bedtime.   Yes [provider]  XTAMPZA  ER 9 MG C12A Take 9 mg by mouth every 12 (twelve) hours.   Yes [provider]  Continuous Glucose Receiver (FREESTYLE LIBRE 3 READER) DEVI as directed. 01/28/24   [provider]  magnesium  oxide (MAG-OX) 400 MG tablet TAKE TWO (2) TABLETS BY MOUTH TWICE DAILY *NEW PRESCRIPTION REQUEST* Patient not taking: Reported on 05/26/2024 05/18/24   Cloretta Arley NOVAK, MD  MAGnesium -Oxide 400 (240 Mg) MG tablet Take 2 tablets (800 mg total) by mouth 2 (two) times daily. Patient not taking: Reported on 05/26/2024 05/18/24   Cloretta Arley NOVAK, MD  oxyCODONE  (ROXICODONE ) 5 MG immediate release tablet Take 1 tablet (5 mg total) by mouth every 4 (four) hours as needed for severe pain (pain score 7-10). Patient not taking: Reported on 05/26/2024 05/21/24   Mannie Fairy DASEN, DO    Social History   Socioeconomic History   Marital status: Married    Spouse name: Not on file   Number of children: 2   Years of education: Not on file   Highest education level: Not on file  Occupational History   Occupation: Disabled  Tobacco Use   Smoking status: Former    Current packs/day: 0.00    Types: Cigarettes    Start date: 05/13/1977    Quit date: 05/13/1982    Years since quitting: 42.0    Passive exposure: Past   Smokeless tobacco: Never  Vaping Use   Vaping status: Never Used  Substance and Sexual Activity   Alcohol use: No   Drug use: No   Sexual activity: Not Currently  Other Topics Concern   Not on file  Social History Narrative   ** Merged History Encounter **       Married and lives with husband Has 2 children  Housewife Disabled 2-3 cups of  caffeine  R handed    Social Drivers of Corporate investment banker Strain: Not on file  Food Insecurity: No Food Insecurity (05/27/2024)   Hunger Vital Sign    Worried About Running Out of Food in the Last Year: Never true    Ran Out of Food in the Last Year: Never true  Transportation Needs: No Transportation Needs (05/27/2024)   PRAPARE - Administrator, Civil Service (Medical): No    Lack of Transportation (Non-Medical): No  Physical Activity: Not  on file  Stress: Not on file  Social Connections: Moderately Integrated (05/27/2024)   Social Connection and Isolation Panel    Frequency of Communication with Friends and Family: Once a week    Frequency of Social Gatherings with Friends and Family: Twice a week    Attends Religious Services: Never    Database administrator or Organizations: Yes    Attends Banker Meetings: 1 to 4 times per year    Marital Status: Married  Catering manager Violence: Unknown (05/27/2024)   Humiliation, Afraid, Rape, and Kick questionnaire    Fear of Current or Ex-Partner: Not on file    Emotionally Abused: No    Physically Abused: No    Sexually Abused: No     Family History  Problem Relation Age of Onset   Emphysema Father    Diabetes Father    Emphysema Sister    Emphysema Brother    Cancer Mother        Skin Cancer   Diabetes Mother    Colon cancer Neg Hx    Esophageal cancer Neg Hx    Rectal cancer Neg Hx    Stomach cancer Neg Hx     ROS: [x]  Positive   [ ]  Negative   [ ]  All sytems reviewed and are negative  Cardiovascular: []  chest pain/pressure []  palpitations []  SOB lying flat []  DOE []  pain in legs while walking []  pain in legs at rest []  pain in legs at night []  non-healing ulcers []  hx of DVT []  swelling in legs  Pulmonary: []  productive cough []  asthma/wheezing []  home O2  Neurologic: []  weakness in []  arms []  legs []  numbness in []  arms []  legs []  hx of CVA []  mini  stroke [] difficulty speaking or slurred speech []  temporary loss of vision in one eye []  dizziness  Hematologic: []  hx of cancer []  bleeding problems []  problems with blood clotting easily  Endocrine:   []  diabetes []  thyroid  disease  GI []  vomiting blood []  blood in stool  GU: []  CKD/renal failure []  HD--[]  M/W/F or []  T/T/S []  burning with urination []  blood in urine  Psychiatric: []  anxiety []  depression  Musculoskeletal: []  arthritis []  joint pain  Integumentary: []  rashes []  ulcers  Constitutional: []  fever []  chills   Physical Examination  Vitals:   05/27/24 0623 05/27/24 0720  BP: (!) 122/43 (!) 136/43  Pulse: 74 76  Resp: 18 16  Temp: 98.5 F (36.9 C) 98.4 F (36.9 C)  SpO2: 95% 95%   Body mass index is 30.14 kg/m.  General:  WDWN in NAD Gait: Not observed HENT: WNL, normocephalic Pulmonary: normal non-labored breathing Cardiac: regular, without  Murmurs, rubs or gallops Abdomen:  soft, NT/ND Vascular Exam/Pulses: Bilateral femoral pulses palpable Bilateral DP pulses palpable Left heel ulcer is pictured Neurologic: A&O X 3; Appropriate Affect ; SENSATION: normal; MOTOR FUNCTION:  moving all extremities equally. Speech is fluent/normal     CBC    Component Value Date/Time   WBC 5.9 05/27/2024 0631   RBC 2.96 (L) 05/27/2024 0631   HGB 7.2 (L) 05/27/2024 0631   HGB 9.4 (L) 05/02/2024 1132   HCT 25.1 (L) 05/27/2024 0631   PLT 121 (L) 05/27/2024 0631   PLT 162 05/02/2024 1132   MCV 84.8 05/27/2024 0631   MCH 24.3 (L) 05/27/2024 0631   MCHC 28.7 (L) 05/27/2024 0631   RDW 15.8 (H) 05/27/2024 0631   LYMPHSABS 0.7 05/26/2024 1730   MONOABS 0.8 05/26/2024  1730   EOSABS 0.0 05/26/2024 1730   BASOSABS 0.0 05/26/2024 1730    BMET    Component Value Date/Time   NA 139 05/26/2024 1730   NA 144 08/21/2017 1553   K 4.2 05/26/2024 1730   CL 104 05/26/2024 1730   CO2 24 05/26/2024 1730   GLUCOSE 196 (H) 05/26/2024 1730   BUN 20  05/26/2024 1730   BUN 21 08/21/2017 1553   CREATININE 1.12 (H) 05/26/2024 1730   CREATININE 0.91 05/02/2024 1132   CALCIUM  8.4 (L) 05/26/2024 1730   CALCIUM  9.6 09/19/2010 2220   GFRNONAA 50 (L) 05/26/2024 1730   GFRNONAA >60 05/02/2024 1132   GFRAA >60 03/29/2020 0314    COAGS: Lab Results  Component Value Date   INR 1.2 05/26/2024   INR 1.0 02/22/2024   INR 1.4 (H) 10/14/2023     Non-Invasive Vascular Imaging:     ABIs on the left are 1.01 biphasic with a toe pressure of 0  ASSESSMENT/PLAN: This is a 78 y.o. female  with history of diabetes, hyperlipidemia, basal ganglia stroke, partially obstructing rectal cancer status post colostomy that vascular surgery was engaged for possible angiogram while she is admitted.  Seen in the office on Tuesday with necrotic left heel wound that has gotten much worse.  Plan was outpatient angiogram.  Now admitted due to femur fracture after mechanical fall.  Having surgery today with orthopedic surgery.  Discussed plan with patient and family at bedside that we will try to arrange angiogram next week while she is here in the hospital after femur done.  Previously thought she had adequate inflow without intervention but her necrotic heel eschar has gotten much worse.  She does have a toe pressure of 0.  This may be all small vessel disease.  Will schedule angiogram early next week assuming she clinically does okay.  Lonni DOROTHA Gaskins, MD [facility] Office: [phone]  Lonni JINNY Gaskins

## 2024-05-27 NOTE — Consult Note (Signed)
 Reason for Consult:Left femur fx Referring Physician: Alejandro Loose Time called: [id number] Time at bedside: 0920   [name] is an 78 y.o. female.  HPI: Shanai fell several times yesterday. The final time she fell her left leg got caught under her and while trying to straighten it out there was a pop. She was brought to the ED where x-rays showed a left periprosthetic femur fx and orthopedic surgery was consulted. Due to the complexity of the fx orthopedic trauma consultation was requested. She lives at home with family and uses a RW to ambulate.  Past Medical History:  Diagnosis Date   Arthritis    Basal ganglia stroke [facility])    March 2022   Bilateral lower extremity edema    Cancer (HCC)    twice- colon cancer, unsure of the other type of cancer   Carotid artery occlusion    Flank pain    Full dentures    History of cervical cancer    11/ 1994  Stage IB  s/p  high dose radiation brachytherapy @ [facility] 01/ 1995--- per pt no recurrence   History of chronic bronchitis    History of hyperthyroidism    d 03/ 2011---due to grave's disease--- s/p  RAI treatment 04/ 2011   History of kidney stones    History of sepsis 05/03/2018   due to UTI with klebsiella/ pyelonephritis/ ureteral obstruction cause by stone   Hyperlipidemia    Hypothyroidism, postradioiodine therapy    endocrinologist--  dr kassie--  dx graves disease and s/p RAI i131 treatement 4/ 2011   Nauseated    Oral thrush 05/03/2018   Pneumonia    Type 2 diabetes mellitus treated with insulin  (HCC)    FOLLOWED BY PCP   Urgency of urination    Wears glasses     Past Surgical History:  Procedure Laterality Date   APPENDECTOMY     BIOPSY  01/23/2021   Procedure: BIOPSY;  Surgeon: Shila Gustav GAILS, MD;  Location: [facility];  Service: Endoscopy;;   BIOPSY  10/12/2021   Procedure: BIOPSY;  Surgeon: Eda Iha, MD;  Location: [facility];  Service: Gastroenterology;;   BIOPSY  09/11/2023   Procedure:  BIOPSY;  Surgeon: Charlanne Groom, MD;  Location: [facility];  Service: Gastroenterology;;   BIOPSY  10/13/2023   Procedure: BIOPSY;  Surgeon: Federico Rosario BROCKS, MD;  Location: [facility];  Service: Gastroenterology;;   CAROTID ENDARTERECTOMY Right 03/28/2020   CATARACT EXTRACTION W/ INTRAOCULAR LENS  IMPLANT, BILATERAL  2004  approx.   COLONOSCOPY     COLONOSCOPY WITH PROPOFOL  N/A 01/23/2021   Procedure: COLONOSCOPY WITH PROPOFOL ;  Surgeon: Shila Gustav GAILS, MD;  Location: [facility];  Service: Endoscopy;  Laterality: N/A;   CYSTOSCOPY WITH STENT PLACEMENT Right 05/04/2018   Procedure: CYSTOSCOPY WITH STENT PLACEMENT AND RETROGRADE PYELOGRAM;  Surgeon: Devere Lonni Righter, MD;  Location: [facility];  Service: Urology;  Laterality: Right;   CYSTOSCOPY/URETEROSCOPY/HOLMIUM LASER/STENT PLACEMENT Right 05/19/2018   Procedure: CYSTOSCOPY, URETEROSCOPY/HOLMIUM LASER, STONE BASKETRY/ STENT EXCHANGE;  Surgeon: Devere Lonni Righter, MD;  Location: [facility];  Service: Urology;  Laterality: Right;   ENDARTERECTOMY Right 03/28/2020   Procedure: RIGHT CAROTID ENDARTERECTOMY with PATCH ANGIOPLASTY;  Surgeon: Oris Krystal JULIANNA, MD;  Location: [facility];  Service: Vascular;  Laterality: Right;   ENTEROSCOPY N/A 10/13/2023   Procedure: ENTEROSCOPY;  Surgeon: Federico Rosario BROCKS, MD;  Location: [facility];  Service: Gastroenterology;  Laterality: N/A;   ESOPHAGOGASTRODUODENOSCOPY (EGD) WITH PROPOFOL  N/A 10/12/2021  Procedure: ESOPHAGOGASTRODUODENOSCOPY (EGD) WITH PROPOFOL ;  Surgeon: Eda Iha, MD;  [facility];  Service: Gastroenterology;  Laterality: N/A;   ESOPHAGOGASTRODUODENOSCOPY (EGD) WITH PROPOFOL  N/A 09/11/2023   Procedure: ESOPHAGOGASTRODUODENOSCOPY (EGD) WITH PROPOFOL ;  Surgeon: Charlanne Groom, MD;  [facility];  Service: Gastroenterology;  Laterality: N/A;   EXCISION MASS LEFT CHEST WALL  09-20-2007   dr sebastian  [facility]   HOT HEMOSTASIS  10/13/2023    Procedure: HOT HEMOSTASIS (ARGON PLASMA COAGULATION/BICAP);  Surgeon: Federico Rosario BROCKS, MD;  Location: [facility];  Service: Gastroenterology;;   Radioactive Iodine  Therapy     for thyroid    REVISION TOTAL HIP ARTHROPLASTY Left early 2000s   SUBMUCOSAL TATTOO INJECTION  01/23/2021   Procedure: SUBMUCOSAL TATTOO INJECTION;  Surgeon: Shila Gustav GAILS, MD;  Location: [facility];  Service: Endoscopy;;   TANDEM RING INSERTION  1995   dr olean @ [facility]   EUA w/ tandem placement in ovid  (for direct high dose radiation brachytherapy , cervical cancer)   TOTAL HIP ARTHROPLASTY Left 1990s   TRANSTHORACIC ECHOCARDIOGRAM  08/07/2017   mild focal basal hypertrophy of the septum,  ef 60-65%, grade 1 diastolic dysfunction/  atrial septum with lipomatous hypertrophy/  trivial PR   UPPER GASTROINTESTINAL ENDOSCOPY      Family History  Problem Relation Age of Onset   Emphysema Father    Diabetes Father    Emphysema Sister    Emphysema Brother    Cancer Mother        Skin Cancer   Diabetes Mother    Colon cancer Neg Hx    Esophageal cancer Neg Hx    Rectal cancer Neg Hx    Stomach cancer Neg Hx     Social History:  reports that she quit smoking about 42 years ago. Her smoking use included cigarettes. She started smoking about 47 years ago. She has been exposed to tobacco smoke. She has never used smokeless tobacco. She reports that she does not drink alcohol and does not use drugs.  Allergies:  Allergies  Allergen Reactions   Codeine Hives and Nausea Only    Medications: I have reviewed the patient's current medications.  Results for orders placed or performed during the hospital encounter of 05/26/24 (from the past 48 hours)  Basic metabolic panel     Status: Abnormal   Collection Time: 05/26/24  5:30 PM  Result Value Ref Range   Sodium 139 135 - 145 mmol/L   Potassium 4.2 3.5 - 5.1 mmol/L   Chloride 104 98 - 111 mmol/L   CO2 24 22 - 32 mmol/L   Glucose, Bld 196 (H) 70 - 99  mg/dL    Comment: Glucose reference range applies only to samples taken after fasting for at least 8 hours.   BUN 20 8 - 23 mg/dL   Creatinine, Ser 8.87 (H) 0.44 - 1.00 mg/dL   Calcium  8.4 (L) 8.9 - 10.3 mg/dL   GFR, Estimated 50 (L) >60 mL/min    Comment: (NOTE) Calculated using the CKD-EPI Creatinine Equation (2021)    Anion gap 11 5 - 15    Comment: Performed at [facility], [address], [place], [place] [address]  CBC with Differential     Status: Abnormal   Collection Time: 05/26/24  5:30 PM  Result Value Ref Range   WBC 10.4 4.0 - 10.5 K/uL   RBC 3.60 (L) 3.87 - 5.11 MIL/uL   Hemoglobin 8.8 (L) 12.0 - 15.0 g/dL   HCT 69.1 (L)  36.0 - 46.0 %   MCV 85.6 80.0 - 100.0 fL   MCH 24.4 (L) 26.0 - 34.0 pg   MCHC 28.6 (L) 30.0 - 36.0 g/dL   RDW 84.1 (H) 88.4 - 84.4 %   Platelets 154 150 - 400 K/uL   nRBC 0.0 0.0 - 0.2 %   Neutrophils Relative % 87 %   Neutro Abs 8.9 (H) 1.7 - 7.7 K/uL   Lymphocytes Relative 6 %   Lymphs Abs 0.7 0.7 - 4.0 K/uL   Monocytes Relative 7 %   Monocytes Absolute 0.8 0.1 - 1.0 K/uL   Eosinophils Relative 0 %   Eosinophils Absolute 0.0 0.0 - 0.5 K/uL   Basophils Relative 0 %   Basophils Absolute 0.0 0.0 - 0.1 K/uL   Immature Granulocytes 0 %   Abs Immature Granulocytes 0.04 0.00 - 0.07 K/uL    Comment: Performed at [facility], [address] [place], [place] [address]  Protime-INR     Status: Abnormal   Collection Time: 05/26/24  5:30 PM  Result Value Ref Range   Prothrombin Time 15.4 (H) 11.4 - 15.2 seconds   INR 1.2 0.8 - 1.2    Comment: (NOTE) INR goal varies based on device and disease states. Performed at [facility], [address], [place], [place] [address]   Type and screen [facility]     Status: None   Collection Time: 05/26/24  5:30 PM  Result Value Ref Range   ABO/RH(D) O POS    Antibody Screen NEG    Sample Expiration      05/29/2024,2359 Performed  at [facility], [address] [place], [place] [address]   Magnesium      Status: Abnormal   Collection Time: 05/26/24  5:46 PM  Result Value Ref Range   Magnesium  1.1 (L) 1.7 - 2.4 mg/dL    Comment: Performed at [facility], [address], [place], [place] [address]  Glucose, capillary     Status: Abnormal   Collection Time: 05/27/24 12:04 AM  Result Value Ref Range   Glucose-Capillary 213 (H) 70 - 99 mg/dL    Comment: Glucose reference range applies only to samples taken after fasting for at least 8 hours.  Glucose, capillary     Status: Abnormal   Collection Time: 05/27/24  4:23 AM  Result Value Ref Range   Glucose-Capillary 106 (H) 70 - 99 mg/dL    Comment: Glucose reference range applies only to samples taken after fasting for at least 8 hours.  Magnesium      Status: None   Collection Time: 05/27/24  6:31 AM  Result Value Ref Range   Magnesium  2.2 1.7 - 2.4 mg/dL    Comment: Performed at [facility], [address], [place], [place] [address]  Comprehensive metabolic panel with GFR     Status: Abnormal   Collection Time: 05/27/24  6:31 AM  Result Value Ref Range   Sodium 142 135 - 145 mmol/L   Potassium 3.7 3.5 - 5.1 mmol/L   Chloride 108 98 - 111 mmol/L   CO2 25 22 - 32 mmol/L   Glucose, Bld 121 (H) 70 - 99 mg/dL    Comment: Glucose reference range applies only to samples taken after fasting for at least 8 hours.   BUN 13 8 - 23 mg/dL   Creatinine, Ser 9.07 0.44 - 1.00 mg/dL   Calcium  8.0 (L) 8.9 - 10.3 mg/dL  Total Protein 5.0 (L) 6.5 - 8.1 g/dL   Albumin 2.1 (L) 3.5 - 5.0 g/dL   AST 14 (L) 15 - 41 U/L   ALT 15 0 - 44 U/L   Alkaline Phosphatase 79 38 - 126 U/L   Total Bilirubin 0.5 0.0 - 1.2 mg/dL   GFR, Estimated >39 >39 mL/min    Comment: (NOTE) Calculated using the CKD-EPI Creatinine Equation (2021)    Anion gap 9 5 - 15    Comment: Performed at [facility], [address] [place], [place] [address]  CBC      Status: Abnormal   Collection Time: 05/27/24  6:31 AM  Result Value Ref Range   WBC 5.9 4.0 - 10.5 K/uL   RBC 2.96 (L) 3.87 - 5.11 MIL/uL   Hemoglobin 7.2 (L) 12.0 - 15.0 g/dL   HCT 74.8 (L) 63.9 - 53.9 %   MCV 84.8 80.0 - 100.0 fL   MCH 24.3 (L) 26.0 - 34.0 pg   MCHC 28.7 (L) 30.0 - 36.0 g/dL   RDW 84.1 (H) 88.4 - 84.4 %   Platelets 121 (L) 150 - 400 K/uL   nRBC 0.0 0.0 - 0.2 %    Comment: Performed at [facility], [address], [place], [place] [address]  VITAMIN D  25 Hydroxy (Vit-D Deficiency, Fractures)     Status: Abnormal   Collection Time: 05/27/24  6:31 AM  Result Value Ref Range   Vit D, 25-Hydroxy 28.18 (L) 30 - 100 ng/mL    Comment: (NOTE) Vitamin D  deficiency has been defined by the Institute of Medicine  and an Endocrine Society practice guideline as a level of serum 25-OH  vitamin D  less than 20 ng/mL (1,2). The Endocrine Society went on to  further define vitamin D  insufficiency as a level between 21 and 29  ng/mL (2).  1. IOM (Institute of Medicine). 2010. Dietary reference intakes for  calcium  and D. [place]: [employer]. 2. Holick MF, Binkley Arthur, Bischoff-Ferrari HA, et al. Evaluation,  treatment, and prevention of vitamin D  deficiency: an Endocrine  Society clinical practice guideline, JCEM. 2011 Jul; 96(7): 1911-30.  Performed at [facility], [address], [place], [place] [address]   Glucose, capillary     Status: Abnormal   Collection Time: 05/27/24  8:24 AM  Result Value Ref Range   Glucose-Capillary 135 (H) 70 - 99 mg/dL    Comment: Glucose reference range applies only to samples taken after fasting for at least 8 hours.    CT FEMUR LEFT WO CONTRAST Result Date: 05/26/2024 CLINICAL DATA:  Femur fracture EXAM: CT OF THE LOWER LEFT EXTREMITY WITHOUT CONTRAST TECHNIQUE: Multidetector CT imaging of the lower left extremity was performed according to the standard protocol. RADIATION DOSE REDUCTION: This exam was  performed according to the departmental dose-optimization program which includes automated exposure control, adjustment of the mA and/or kV according to patient size and/or use of iterative reconstruction technique. COMPARISON:  Plain films today FINDINGS: Bones/Joint/Cartilage Prior left hip replacement. There is a fracture through the midshaft of the left femur just below the hip prosthesis stem. Angulation and displacement. Ligaments Suboptimally assessed by CT. Muscles and Tendons Negative Soft tissues Edema throughout the subcutaneous soft tissues. IMPRESSION: Angulated and displaced midshaft left femoral fracture just below the left femoral prosthesis. Prior left hip replacement. Electronically Signed   By: Franky Crease M.D.   On: 05/26/2024 23:37   DG Tibia/Fibula Left Result Date: 05/26/2024  CLINICAL DATA:  Fall. EXAM: LEFT TIBIA AND FIBULA - 2 VIEW COMPARISON:  None Available. FINDINGS: Only AP image could be obtained. There is no evidence of fracture or other focal bone lesions. Soft tissues are unremarkable. IMPRESSION: No definite abnormality seen on this AP image only. Electronically Signed   By: Lynwood Landy Raddle M.D.   On: 05/26/2024 17:29   DG FEMUR MIN 2 VIEWS LEFT Result Date: 05/26/2024 CLINICAL DATA:  Fall. EXAM: LEFT FEMUR 2 VIEWS COMPARISON:  None Available. FINDINGS: Status post left total hip arthroplasty. Moderately angulated fracture of the midshaft of left femur is noted. IMPRESSION: Moderately angulated midshaft fracture of left femur. Electronically Signed   By: Lynwood Landy Raddle M.D.   On: 05/26/2024 17:28   DG Pelvis 1-2 Views Result Date: 05/26/2024 CLINICAL DATA:  Fall. EXAM: PELVIS - 1-2 VIEW COMPARISON:  September 05, 2021. FINDINGS: Status post left total hip arthroplasty. No fracture or dislocation is noted. IMPRESSION: No acute abnormality seen. Electronically Signed   By: Lynwood Landy Raddle M.D.   On: 05/26/2024 17:27   DG Chest 1 View Result Date: 05/26/2024 CLINICAL DATA:   Weakness and fall. EXAM: CHEST  1 VIEW COMPARISON:  Chest radiograph dated 10/14/2023. FINDINGS: Right-sided Port-A-Cath with tip at the cavoatrial junction. No focal consolidation, pleural effusion, pneumothorax. Stable cardiac silhouette. No acute osseous pathology. IMPRESSION: No active disease. Electronically Signed   By: Vanetta Chou M.D.   On: 05/26/2024 17:24    Review of Systems  HENT:  Negative for ear discharge, ear pain, hearing loss and tinnitus.   Eyes:  Negative for photophobia and pain.  Respiratory:  Negative for cough and shortness of breath.   Cardiovascular:  Negative for chest pain.  Gastrointestinal:  Negative for abdominal pain, nausea and vomiting.  Genitourinary:  Negative for dysuria, flank pain, frequency and urgency.  Musculoskeletal:  Positive for arthralgias (Left thigh). Negative for back pain, myalgias and neck pain.  Neurological:  Negative for dizziness and headaches.  Hematological:  Does not bruise/bleed easily.  Psychiatric/Behavioral:  The patient is not nervous/anxious.    Blood pressure (!) 136/43, pulse 76, temperature 98.4 F (36.9 C), temperature source Oral, resp. rate 16, height 5' (1.524 m), weight 70 kg, SpO2 95%. Physical Exam Constitutional:      General: She is not in acute distress.    Appearance: She is well-developed. She is not diaphoretic.  HENT:     Head: Normocephalic and atraumatic.  Eyes:     General: No scleral icterus.       Right eye: No discharge.        Left eye: No discharge.     Conjunctiva/sclera: Conjunctivae normal.  Cardiovascular:     Rate and Rhythm: Normal rate and regular rhythm.  Pulmonary:     Effort: Pulmonary effort is normal. No respiratory distress.  Musculoskeletal:     Cervical back: Normal range of motion.     Comments: LLE No traumatic wounds, ecchymosis, or rash  In [place]  No knee effusion  Sens DPN, SPN, TN intact  Motor EHL, ext, flex, evers 5/5  DP 0, No significant edema  Skin:     General: Skin is warm and dry.  Neurological:     Mental Status: She is alert.  Psychiatric:        Mood and Affect: Mood normal.        Behavior: Behavior normal.     Assessment/Plan: Left femur fx -- Plan ORIF today with Dr. Kendal.  Please keep NPO. Multiple medical problems including T2DM, HLD, HTN, cervical and rectal cancer s/p colostomy, PAD, diabetic foot ulcer,, neuropathy, hypothyroidism, hypomagnesemia, anemia and arthritis -- per primary service    Ozell DOROTHA Ned, PA-C Orthopedic Surgery [phone] 05/27/2024, 9:28 AM

## 2024-05-27 NOTE — Progress Notes (Signed)
 PROGRESS NOTE    [name]  FMW:[id number] DOB: 15-May-1946 DOA: 05/26/2024 PCP: Waylan Almarie SAUNDERS, MD   Brief Narrative:  [name] is a 78 y.o. female with medical history significant for T2DM, HLD, HTN, cervical and rectal cancer s/p colostomy, PAD, diabetic foot ulcer,, neuropathy, hypothyroidism, hypomagnesemia, anemia and arthritis who presented to the ED after a fall at home.  Per son, after returning from PCP office, patient had 3 episodes where she lost her balance while ambulating he was able to catch her each time.  Daughter reports that patient was trying to straighten her bed, lost her balance and fell face down on the bed. Patient's left leg was bent and twisted behind her and as daughter attempted to straighten it, she heard a pop. Patient reported significant pain in the leg so she called EMS. At baseline, patient ambulates with a walker however recently, she has been losing strength in her legs. Patient has chronic lower extremity swelling and neuropathy but has had recent increased redness to her legs, L>R.  She she has had poor appetite but denies any dizziness, recent illness, fevers, chills, nausea or vomiting. She was recently treated for possible UTI about a week ago.   ED Course: Initial vitals show patient hypertensive with SBP in the 140s to 190s, otherwise normal vitals. Initial labs significant for glucose 196, creatinine 1.12, WBC 10.4, Hgb 8.8, normal INR. EKG shows normal sinus rhythm. X-ray of the left femur shows moderately angulated midshaft fracture of the left femur.  Pt received fentanyl  100 mcg x 1. Orthopedic surgery was consulted for evaluation. A Buck's traction was placed.  TRH was consulted for admission.   **Interim History: She was taken for surgical intervention and postoperatively she is very somnolent and had a new oxygen requirement.  We transferred her to the PCU.  Assessment and Plan:  Mechanical fall and  Left femur fracture:  Elderly  patient presented with left leg pain after a mechanical fall at home. X-ray of left femur shows moderately angulated midshaft fracture of the left femur.  Orthopedic surgery consulted and she is s/p ORIF now. Admitted to Med[facility]urg bed at [facility] but transferred to Progressive;  Buck traction placed in the ED.  Pain control oxycodone  and as needed IV Dilaudid . Fall precautions; Will need further care per Ortho   Lower extremity cellulitis: Patient with chronic lower extremity edema found to have mild cellulitis of the lower extremities; Start IV Ancef ; CTM and Trend CBC, fever curve  Acute Respiratory Failure w/ Hypoxia: Post Op and ? Related to Anesthesia. SpO2: 98 % O2 Flow Rate (L/min): 4 L/min FiO2 (%): 21 % -Given IV Lasix . Check CXR and showed Stable cardiomediastinal silhouette. Right internal jugular Port-A-Cath is unchanged. Interval placement of left internal jugular catheter with distal tip in expected position of left brachiocephalic vein. No acute pulmonary disease is noted. Bony thorax is unremarkable. No pneumothorax is seen.   Type 2 Diabetes Mellitus:  Last A1c 6.5% 3 months ago;  Blood sugar elevated to 196 on BMP. Hold metformin  in the setting of pending surgery. Q4H SSI with CBG monitoring while NPO. Follow-up repeat A1c. CBG Trend:  Recent Labs  Lab 05/27/24 0004 05/27/24 0423 05/27/24 0824 05/27/24 1123 05/27/24 1533  GLUCAP 213* 106* 135* 86 112*    Diabetic Foot Ulcer / Nonhealing pressure ulcer:  Patient with full-thickness ulcer at the posterior left heel with large eschar (see media tab);  No significant improvement in left heel ulcer over the  last few weeks.  Followed by podiatry and wound care. Wound care consulted, appreciate recs; Angiogram as below   PAD: ABIs 02/16/2024 are 0.85 on the right biphasic and 1.01 on the left biphasic. Follows with vascular surgery with plan for aortogram with LLE angiogram due to worsening left foot ulcer and toe pressure of 0.  Vascular  surgery consulted by Ortho in regards to possible intervention during this hospitalization and plan is for Angiogram early next week. - Continue atorvastatin    Hydronephrosis:  CT A/P 5 days ago showed bilateral hydronephrosis, R>L but no visible obstructing stone; Hydronephrosis thought to be due to patient's rectal cancer. Check Renal U/S and pending results may need Inpt from Urological standpoint    Chronic Hypomagnesemia:  Unclear etiology, receives intermittent IV magnesium  by cancer center. Magnesium  low at 1.1 on admission. Start IV mag 4 g x 1 and follow-up Repeat Mag:  Recent Labs  Lab 05/02/24 1132 05/26/24 1746 05/27/24 0631  MG 0.9* 1.1* 2.2  -CTM and Trend and repeat Mag Level in the AM   Essential HTN:  BP elevated with SBP in the 140s to 170s. C/w IV hydralazine  PRN for SBP > 180. CTM for S/Sx of Bleeding; Repeat BP per Protocol.   Cervical Cancer / Rectal cancer s/p colostomy: Followed closely with oncology, patient has declined further chemotherapy or surgery for removal of the remaining tumor - Continue vitamin supplementation   Normocytic Anemia / IDA - Hgb stable at 8.8, baseline 8-9 -Hgb/Hct Trend:  Recent Labs  Lab 05/02/24 1132 05/21/24 1409 05/26/24 1730 05/27/24 0631  HGB 9.4* 9.1* 8.8* 7.2*  HCT 31.5* 31.3* 30.8* 25.1*  MCV 83.3 85.8 85.6 84.8  -Check Anemia Panel in the AM; Continue iron supplementation -CTM for S/Sx of Bleeding; No overt bleeding noted and repeat CBC in the AM   Chronic BLE edema: Thought to be due to lymphedema;  Apply TED hose   HLD: Continue atorvastatin    Hypothyroidism: Continue on Synthroid   GERD/GI Prophylaxis: Continue home Protonix    Mood disorder:  Continue sertraline   Hypoalbuminemia: Patient's Albumin Lvl is now 2.1. CTM and Trend and repeat CMP in the AM   DVT prophylaxis: Place TED hose Start: 05/26/24 2205 SCDs Start: 05/26/24 1943    Code Status: Full Code Family Communication: D/w daughter over the  phone  Disposition Plan:  Level of care: Progressive Status is: Inpatient Remains inpatient appropriate because: Needs further clinical improvement and clearance by the specialists   Consultants:  Orthopedic Surgery Vascular Surgery  Procedures:  As delineated as above   Antimicrobials:  Anti-infectives (From admission, onward)    Start     Dose/Rate Route Frequency Ordered Stop   05/27/24 1410  vancomycin  (VANCOCIN ) powder  Status:  Discontinued          As needed 05/27/24 1410 05/27/24 1528   05/26/24 2245  ceFAZolin  (ANCEF ) IVPB 1 g/50 mL premix        1 g 100 mL/hr over 30 Minutes Intravenous Every 8 hours 05/26/24 2150         Subjective: Seen and examined in the PACU and she is a little somnolent and dyspneic.  Denies any current pain at this time.  Has a slight tachycardia.  Denies any chest pain or shortness of breath.  No other concerns or complaints at this time.  Objective: Vitals:   05/27/24 1830 05/27/24 1900 05/27/24 1930 05/27/24 2000  BP: (!) 156/47 (!) 106/58 (!) 123/38 (!) 137/40  Pulse: 63 66 63 73  Resp: 15 12 14 19   Temp:      TempSrc:      SpO2: 96% 95% 93% 98%  Weight:      Height:        Intake/Output Summary (Last 24 hours) at 05/27/2024 2101 Last data filed at 05/27/2024 1535 Gross per 24 hour  Intake 1451 ml  Output 300 ml  Net 1151 ml   Filed Weights   05/26/24 1440 05/27/24 1130  Weight: 70 kg 70 kg   Examination: Physical Exam:  Constitutional: WN/WD obese Caucasian female who is confused post-anesthesia Respiratory: Diminished to auscultation bilaterally with coarse breath sounds, no wheezing, rales, rhonchi or crackles. Normal respiratory effort and patient is not tachypenic. No accessory muscle use. Wearing 4 Liters of Supplemental O2 via [place] Cardiovascular: RRR, no murmurs / rubs / gallops. S1 and S2 auscultated. No extremity edema.  Abdomen: Soft, non-tender, Distended and has an ostomy. Bowel sounds positive.  GU:  Deferred. Musculoskeletal: Left Leg is wrapped Skin: Foot Ulcer is wrapped Neurologic: CN 2-12 grossly intact with no focal deficits. Romberg sign and cerebellar reflexes not assessed.  Psychiatric: Normal judgment and insight. Alert and oriented x 3. Normal mood and appropriate affect.   Data Reviewed: I have personally reviewed following labs and imaging studies  CBC: Recent Labs  Lab 05/21/24 1409 05/26/24 1730 05/27/24 0631  WBC 11.7* 10.4 5.9  NEUTROABS  --  8.9*  --   HGB 9.1* 8.8* 7.2*  HCT 31.3* 30.8* 25.1*  MCV 85.8 85.6 84.8  PLT 140* 154 121*   Basic Metabolic Panel: Recent Labs  Lab 05/21/24 1409 05/26/24 1730 05/26/24 1746 05/27/24 0631  NA 139 139  --  142  K 4.4 4.2  --  3.7  CL 103 104  --  108  CO2 26 24  --  25  GLUCOSE 75 196*  --  121*  BUN 18 20  --  13  CREATININE 0.97 1.12*  --  0.92  CALCIUM  8.1* 8.4*  --  8.0*  MG  --   --  1.1* 2.2   GFR: Estimated Creatinine Clearance: 44 mL/min (by C-G formula based on SCr of 0.92 mg/dL). Liver Function Tests: Recent Labs  Lab 05/27/24 0631  AST 14*  ALT 15  ALKPHOS 79  BILITOT 0.5  PROT 5.0*  ALBUMIN 2.1*   No results for input(s): LIPASE, AMYLASE in the last 168 hours. No results for input(s): AMMONIA in the last 168 hours. Coagulation Profile: Recent Labs  Lab 05/26/24 1730  INR 1.2   Cardiac Enzymes: No results for input(s): CKTOTAL, CKMB, CKMBINDEX, TROPONINI in the last 168 hours. BNP (last 3 results) No results for input(s): PROBNP in the last 8760 hours. HbA1C: No results for input(s): HGBA1C in the last 72 hours. CBG: Recent Labs  Lab 05/27/24 0004 05/27/24 0423 05/27/24 0824 05/27/24 1123 05/27/24 1533  GLUCAP 213* 106* 135* 86 112*   Lipid Profile: No results for input(s): CHOL, HDL, LDLCALC, TRIG, CHOLHDL, LDLDIRECT in the last 72 hours. Thyroid  Function Tests: No results for input(s): TSH, T4TOTAL, FREET4, T3FREE, THYROIDAB in  the last 72 hours. Anemia Panel: No results for input(s): VITAMINB12, FOLATE, FERRITIN, TIBC, IRON, RETICCTPCT in the last 72 hours. Sepsis Labs: No results for input(s): PROCALCITON, LATICACIDVEN in the last 168 hours.  Recent Results (from the past 240 hours)  Surgical pcr screen     Status: None   Collection Time: 05/27/24 11:08 AM   Specimen: Nasal Mucosa; Nasal Swab  Result  Value Ref Range Status   MRSA, PCR NEGATIVE NEGATIVE Final   Staphylococcus aureus NEGATIVE NEGATIVE Final    Comment: (NOTE) The Xpert SA Assay (FDA approved for NASAL specimens in patients 78 years of age and older), is one component of a comprehensive surveillance program. It is not intended to diagnose infection nor to guide or monitor treatment. Performed at [facility], [address] [place], [place] [address]     Radiology Studies: DG FEMUR PORT MIN 2 VIEWS LEFT Result Date: 05/27/2024 CLINICAL DATA:  Postoperative status. EXAM: LEFT FEMUR PORTABLE 2 VIEWS COMPARISON:  Fluoroscopic images of same day. FINDINGS: Status post left total hip arthroplasty. Status post surgical internal fixation of midshaft fracture of left femur. Good alignment of fracture components is noted. IMPRESSION: Status post surgical internal fixation of left femoral midshaft fracture. Electronically Signed   By: Lynwood Landy Raddle M.D.   On: 05/27/2024 18:14   DG CHEST PORT 1 VIEW Result Date: 05/27/2024 CLINICAL DATA:  Central line placement. EXAM: PORTABLE CHEST 1 VIEW COMPARISON:  May 26, 2024. FINDINGS: Stable cardiomediastinal silhouette. Right internal jugular Port-A-Cath is unchanged. Interval placement of left internal jugular catheter with distal tip in expected position of left brachiocephalic vein. No acute pulmonary disease is noted. Bony thorax is unremarkable. No pneumothorax is seen. IMPRESSION: Interval placement of left internal jugular catheter with distal tip in expected position of left  brachiocephalic vein. Electronically Signed   By: Lynwood Landy Raddle M.D.   On: 05/27/2024 18:13   DG FEMUR MIN 2 VIEWS LEFT Result Date: 05/27/2024 CLINICAL DATA:  Elective surgery. EXAM: LEFT FEMUR 2 VIEWS COMPARISON:  Preoperative imaging FINDINGS: Six fluoroscopic spot views of the femur are submitted from the operating room. Plate and screw fixation of femoral shaft fracture with additional cerclage wires. Previous proximal femoral hardware is in place. Fluoroscopy time 38.7 seconds. Dose 3.76 mGy. IMPRESSION: Intraoperative fluoroscopy during femoral fracture fixation. Electronically Signed   By: Andrea Gasman M.D.   On: 05/27/2024 15:29   DG C-Arm 1-60 Min-No Report Result Date: 05/27/2024 Fluoroscopy was utilized by the requesting physician.  No radiographic interpretation.   CT FEMUR LEFT WO CONTRAST Result Date: 05/26/2024 CLINICAL DATA:  Femur fracture EXAM: CT OF THE LOWER LEFT EXTREMITY WITHOUT CONTRAST TECHNIQUE: Multidetector CT imaging of the lower left extremity was performed according to the standard protocol. RADIATION DOSE REDUCTION: This exam was performed according to the departmental dose-optimization program which includes automated exposure control, adjustment of the mA and/or kV according to patient size and/or use of iterative reconstruction technique. COMPARISON:  Plain films today FINDINGS: Bones/Joint/Cartilage Prior left hip replacement. There is a fracture through the midshaft of the left femur just below the hip prosthesis stem. Angulation and displacement. Ligaments Suboptimally assessed by CT. Muscles and Tendons Negative Soft tissues Edema throughout the subcutaneous soft tissues. IMPRESSION: Angulated and displaced midshaft left femoral fracture just below the left femoral prosthesis. Prior left hip replacement. Electronically Signed   By: Franky Crease M.D.   On: 05/26/2024 23:37   DG Tibia/Fibula Left Result Date: 05/26/2024 CLINICAL DATA:  Fall. EXAM: LEFT TIBIA AND  FIBULA - 2 VIEW COMPARISON:  None Available. FINDINGS: Only AP image could be obtained. There is no evidence of fracture or other focal bone lesions. Soft tissues are unremarkable. IMPRESSION: No definite abnormality seen on this AP image only. Electronically Signed   By: Lynwood Landy Raddle M.D.   On: 05/26/2024 17:29   DG FEMUR MIN 2 VIEWS  LEFT Result Date: 05/26/2024 CLINICAL DATA:  Fall. EXAM: LEFT FEMUR 2 VIEWS COMPARISON:  None Available. FINDINGS: Status post left total hip arthroplasty. Moderately angulated fracture of the midshaft of left femur is noted. IMPRESSION: Moderately angulated midshaft fracture of left femur. Electronically Signed   By: Lynwood Landy Raddle M.D.   On: 05/26/2024 17:28   DG Pelvis 1-2 Views Result Date: 05/26/2024 CLINICAL DATA:  Fall. EXAM: PELVIS - 1-2 VIEW COMPARISON:  September 05, 2021. FINDINGS: Status post left total hip arthroplasty. No fracture or dislocation is noted. IMPRESSION: No acute abnormality seen. Electronically Signed   By: Lynwood Landy Raddle M.D.   On: 05/26/2024 17:27   DG Chest 1 View Result Date: 05/26/2024 CLINICAL DATA:  Weakness and fall. EXAM: CHEST  1 VIEW COMPARISON:  Chest radiograph dated 10/14/2023. FINDINGS: Right-sided Port-A-Cath with tip at the cavoatrial junction. No focal consolidation, pleural effusion, pneumothorax. Stable cardiac silhouette. No acute osseous pathology. IMPRESSION: No active disease. Electronically Signed   By: Vanetta Chou M.D.   On: 05/26/2024 17:24     Scheduled Meds:  atorvastatin   40 mg Oral QHS   baclofen   10 mg Oral BID   cholecalciferol   2,000 Units Oral Daily   clotrimazole    Topical Daily   cyanocobalamin   1,000 mcg Oral Daily   feeding supplement  237 mL Oral TID BM   ferrous sulfate   325 mg Oral Q M,W,F   gabapentin   300 mg Oral QHS   gabapentin   600 mg Oral Daily   insulin  aspart  0-15 Units Subcutaneous Q4H   leptospermum manuka honey  1 Application Topical Daily   levothyroxine   137 mcg Oral  Q0600   multivitamin with minerals  1 tablet Oral Q breakfast   oxyCODONE   10 mg Oral Q12H   pantoprazole   40 mg Oral QAC breakfast   sertraline   150 mg Oral QHS   Continuous Infusions:   ceFAZolin  (ANCEF ) IV 1 g (05/27/24 0627)    LOS: 1 day   Alejandro Marker, DO [facility] Available via Epic secure chat 7am-7pm After these hours, please refer to coverage provider listed on [email or web address] 05/27/2024, 9:01 PM

## 2024-05-27 NOTE — Anesthesia Procedure Notes (Signed)
 Procedure Name: Intubation Date/Time: 05/27/2024 1:41 PM  Performed by: Atanacio Arland HERO, CRNAPre-anesthesia Checklist: Patient identified, Emergency Drugs available, Suction available and Patient being monitored Patient Re-evaluated:Patient Re-evaluated prior to induction Oxygen Delivery Method: Circle System Utilized Preoxygenation: Pre-oxygenation with 100% oxygen Induction Type: IV induction Ventilation: Mask ventilation without difficulty Laryngoscope Size: Mac and 4 Grade View: Grade I Tube type: Oral Tube size: 7.0 mm Number of attempts: 1 Airway Equipment and Method: Stylet Placement Confirmation: ETT inserted through vocal cords under direct vision, positive ETCO2 and breath sounds checked- equal and bilateral Secured at: 21 cm Tube secured with: Tape Dental Injury: Teeth and Oropharynx as per pre-operative assessment

## 2024-05-27 NOTE — Op Note (Signed)
 Orthopaedic Surgery Operative Note (CSN: [id number] ) Date of Surgery: 05/27/2024  Admit Date: 05/26/2024   Diagnoses: Pre-Op Diagnoses: Left periprosthetic femoral shaft fracture  Post-Op Diagnosis: Same  Procedures: 27507-Open reduction internal fixation of left periprosthetic femur fracture  Surgeons : Primary: Kendal Franky SQUIBB, MD  Assistant: Lauraine Moores, PA-C  Location: OR 3   Anesthesia:General   Antibiotics: Ancef  2g preop with 1 gm vancomycin  powder placed topically   Tourniquet time: None    Estimated Blood Loss: 300 mL  Complications:* No complications entered in OR log *   Specimens:* No specimens in log *   Implants: Implant Name Type Inv. Item Serial No. Manufacturer Lot No. LRB No. Used Action  [employer] Cerclage Cable with Crimp    ZIMMER [id number] Left 1 Implanted  Cable-Ready Cable Grip System Cerclage Cable with Crimp    ZIMMER [id number] Left 1 Implanted  PLATE NCB 84Y HIP - [id number] Plate PLATE NCB 84Y HIP  ZIMMER RECON(ORTH,TRAU,BIO,SG)  Left 1 Implanted  SCREW NCB 4.0MX42M - [id number] Screw SCREW NCB 4.0MX42M  ZIMMER RECON(ORTH,TRAU,BIO,SG)  Left 3 Implanted  SCREW NCB 4.9FK55F - [id number] Screw SCREW NCB 4.9FK55F  ZIMMER RECON(ORTH,TRAU,BIO,SG)  Left 1 Implanted  SCREW 5.0 - [id number] Screw SCREW 5.0  ZIMMER RECON(ORTH,TRAU,BIO,SG)  Left 4 Implanted  SCREW NCB 6.4K24K4K3.2XST - [id number] Screw SCREW NCB [id number]  ZIMMER RECON(ORTH,TRAU,BIO,SG)  Left 1 Implanted  CAP LOCK NCB - [id number] Cap CAP LOCK NCB  ZIMMER RECON(ORTH,TRAU,BIO,SG)  Left 8 Implanted     Indications for Surgery: 78 year old female who sustained a left periprosthetic femoral shaft fracture.  Due to the unstable nature of her injury I recommend proceeding with open reduction internal fixation.  Risks and benefits were discussed with the patient's daughter.  Risks included but not limited to bleeding, infection, malunion, nonunion, hardware  failure, hardware irritation, nerve and blood vessel injury, DVT, and the possible anesthetic complications.  She agreed to proceed with surgery and consent was obtained.  Operative Findings: Open reduction internal fixation periprosthetic femoral shaft fracture using Zimmer [employer]omet cables for provisional fixation with a NCB 15 hole distal femoral locking plate.  Procedure: The patient was identified in the preoperative holding area. Consent was confirmed with the patient and their family and all questions were answered. The operative extremity was marked after confirmation with the patient. she was then brought back to the operating room by our anesthesia colleagues.  She was placed under general anesthetic and carefully transferred over to radiolucent flattop table.  A bump was placed under her operative hip.  The left lower extremity was then prepped and draped in usual sterile fashion.  Timeout was performed to verify the patient, the procedure, and the extremity.  Preoperative antibiotics were dosed.  The hip and knee were flexed over a bump.  Fluoroscopic imaging showed the unstable nature of her injury.  A lateral approach to the distal femur and femoral shaft was made and carried down through skin subcutaneous tissue.  I incised through the IT band and then I mobilized the vastus lateralis off of the femur to expose the fracture site.  I cauterized all of the crossing bleeders and noted deep perforators.  Exposed the fracture cleaned out the hematoma and I was able to anatomically reduced it with traction and reduction tenaculum.  I confirmed anatomic reduction with fluoroscopy.  I then proceeded to pass the Zimmer [employer]omet cables around the femoral shaft and tighten those to greater than 100 mmHg.  I then crimped  the clamps and cut those to the the button.  I remove the clamps and confirmed anatomic reduction of the fracture.  I then positioned a 15 hole Zimmer Biomet NCB distal femoral locking plate.   I held it provisionally distally with a 2.0 mm K wire.  I then aligned the proximal portion of the plate using a percutaneous 3.3 mm drill bit using the targeting arm.  I was able to get bicortical fixation around the implant.  I then drilled and placed a 5.0 millimeter screws distally the bring the plate flush to bone.  I then was able to place four 4.0 millimeter screws around the prosthesis getting bicortical fixation.  Locking caps were placed on 3 of the 4 screws.  I then returned to the distal segment and proceeded to place a total of 5 screws distally.  Locking caps were placed on all of the distal screws.  The targeting arm was removed final fluoroscopic imaging was obtained.  The incision was copiously irrigated.  A gram of vancomycin  powder was placed into the incision.  A layered closure of 0 Vicryl, 2-0 Monocryl and 3-0 nylon was used to close the skin.  Sterile dressings were applied.  The patient was then awoke from anesthesia and taken to the PACU in stable condition.  Post Op Plan/Instructions: The patient will be weightbearing as tolerated to the left lower extremity.  She will receive postoperative Ancef .  She does have a history of bleeding ulcers so we will have mechanical prophylaxis with low-dose once daily Lovenox  for chemical prophylaxis.  Will have her mobilize with physical and Occupational Therapy.  I was present and performed the entire surgery.  Lauraine Moores, PA-C did assist me throughout the case. An assistant was necessary given the difficulty in approach, maintenance of reduction and ability to instrument the fracture.   Franky Light, MD [facility]

## 2024-05-27 NOTE — H&P (View-Only) (Signed)
 Reason for Consult:Left femur fx Referring Physician: Alejandro Loose Time called: [id number] Time at bedside: 0920   [name] is an 78 y.o. female.  HPI: Shanai fell several times yesterday. The final time she fell her left leg got caught under her and while trying to straighten it out there was a pop. She was brought to the ED where x-rays showed a left periprosthetic femur fx and orthopedic surgery was consulted. Due to the complexity of the fx orthopedic trauma consultation was requested. She lives at home with family and uses a RW to ambulate.  Past Medical History:  Diagnosis Date   Arthritis    Basal ganglia stroke [facility])    March 2022   Bilateral lower extremity edema    Cancer (HCC)    twice- colon cancer, unsure of the other type of cancer   Carotid artery occlusion    Flank pain    Full dentures    History of cervical cancer    11/ 1994  Stage IB  s/p  high dose radiation brachytherapy @ [facility] 01/ 1995--- per pt no recurrence   History of chronic bronchitis    History of hyperthyroidism    d 03/ 2011---due to grave's disease--- s/p  RAI treatment 04/ 2011   History of kidney stones    History of sepsis 05/03/2018   due to UTI with klebsiella/ pyelonephritis/ ureteral obstruction cause by stone   Hyperlipidemia    Hypothyroidism, postradioiodine therapy    endocrinologist--  dr kassie--  dx graves disease and s/p RAI i131 treatement 4/ 2011   Nauseated    Oral thrush 05/03/2018   Pneumonia    Type 2 diabetes mellitus treated with insulin  (HCC)    FOLLOWED BY PCP   Urgency of urination    Wears glasses     Past Surgical History:  Procedure Laterality Date   APPENDECTOMY     BIOPSY  01/23/2021   Procedure: BIOPSY;  Surgeon: Shila Gustav GAILS, MD;  Location: [facility];  Service: Endoscopy;;   BIOPSY  10/12/2021   Procedure: BIOPSY;  Surgeon: Eda Iha, MD;  Location: [facility];  Service: Gastroenterology;;   BIOPSY  09/11/2023   Procedure:  BIOPSY;  Surgeon: Charlanne Groom, MD;  Location: [facility];  Service: Gastroenterology;;   BIOPSY  10/13/2023   Procedure: BIOPSY;  Surgeon: Federico Rosario BROCKS, MD;  Location: [facility];  Service: Gastroenterology;;   CAROTID ENDARTERECTOMY Right 03/28/2020   CATARACT EXTRACTION W/ INTRAOCULAR LENS  IMPLANT, BILATERAL  2004  approx.   COLONOSCOPY     COLONOSCOPY WITH PROPOFOL  N/A 01/23/2021   Procedure: COLONOSCOPY WITH PROPOFOL ;  Surgeon: Shila Gustav GAILS, MD;  Location: [facility];  Service: Endoscopy;  Laterality: N/A;   CYSTOSCOPY WITH STENT PLACEMENT Right 05/04/2018   Procedure: CYSTOSCOPY WITH STENT PLACEMENT AND RETROGRADE PYELOGRAM;  Surgeon: Devere Lonni Righter, MD;  Location: [facility];  Service: Urology;  Laterality: Right;   CYSTOSCOPY/URETEROSCOPY/HOLMIUM LASER/STENT PLACEMENT Right 05/19/2018   Procedure: CYSTOSCOPY, URETEROSCOPY/HOLMIUM LASER, STONE BASKETRY/ STENT EXCHANGE;  Surgeon: Devere Lonni Righter, MD;  Location: [facility];  Service: Urology;  Laterality: Right;   ENDARTERECTOMY Right 03/28/2020   Procedure: RIGHT CAROTID ENDARTERECTOMY with PATCH ANGIOPLASTY;  Surgeon: Oris Krystal JULIANNA, MD;  Location: [facility];  Service: Vascular;  Laterality: Right;   ENTEROSCOPY N/A 10/13/2023   Procedure: ENTEROSCOPY;  Surgeon: Federico Rosario BROCKS, MD;  Location: [facility];  Service: Gastroenterology;  Laterality: N/A;   ESOPHAGOGASTRODUODENOSCOPY (EGD) WITH PROPOFOL  N/A 10/12/2021  Procedure: ESOPHAGOGASTRODUODENOSCOPY (EGD) WITH PROPOFOL ;  Surgeon: Eda Iha, MD;  [facility];  Service: Gastroenterology;  Laterality: N/A;   ESOPHAGOGASTRODUODENOSCOPY (EGD) WITH PROPOFOL  N/A 09/11/2023   Procedure: ESOPHAGOGASTRODUODENOSCOPY (EGD) WITH PROPOFOL ;  Surgeon: Charlanne Groom, MD;  [facility];  Service: Gastroenterology;  Laterality: N/A;   EXCISION MASS LEFT CHEST WALL  09-20-2007   dr sebastian  [facility]   HOT HEMOSTASIS  10/13/2023    Procedure: HOT HEMOSTASIS (ARGON PLASMA COAGULATION/BICAP);  Surgeon: Federico Rosario BROCKS, MD;  Location: [facility];  Service: Gastroenterology;;   Radioactive Iodine  Therapy     for thyroid    REVISION TOTAL HIP ARTHROPLASTY Left early 2000s   SUBMUCOSAL TATTOO INJECTION  01/23/2021   Procedure: SUBMUCOSAL TATTOO INJECTION;  Surgeon: Shila Gustav GAILS, MD;  Location: [facility];  Service: Endoscopy;;   TANDEM RING INSERTION  1995   dr olean @ [facility]   EUA w/ tandem placement in ovid  (for direct high dose radiation brachytherapy , cervical cancer)   TOTAL HIP ARTHROPLASTY Left 1990s   TRANSTHORACIC ECHOCARDIOGRAM  08/07/2017   mild focal basal hypertrophy of the septum,  ef 60-65%, grade 1 diastolic dysfunction/  atrial septum with lipomatous hypertrophy/  trivial PR   UPPER GASTROINTESTINAL ENDOSCOPY      Family History  Problem Relation Age of Onset   Emphysema Father    Diabetes Father    Emphysema Sister    Emphysema Brother    Cancer Mother        Skin Cancer   Diabetes Mother    Colon cancer Neg Hx    Esophageal cancer Neg Hx    Rectal cancer Neg Hx    Stomach cancer Neg Hx     Social History:  reports that she quit smoking about 42 years ago. Her smoking use included cigarettes. She started smoking about 47 years ago. She has been exposed to tobacco smoke. She has never used smokeless tobacco. She reports that she does not drink alcohol and does not use drugs.  Allergies:  Allergies  Allergen Reactions   Codeine Hives and Nausea Only    Medications: I have reviewed the patient's current medications.  Results for orders placed or performed during the hospital encounter of 05/26/24 (from the past 48 hours)  Basic metabolic panel     Status: Abnormal   Collection Time: 05/26/24  5:30 PM  Result Value Ref Range   Sodium 139 135 - 145 mmol/L   Potassium 4.2 3.5 - 5.1 mmol/L   Chloride 104 98 - 111 mmol/L   CO2 24 22 - 32 mmol/L   Glucose, Bld 196 (H) 70 - 99  mg/dL    Comment: Glucose reference range applies only to samples taken after fasting for at least 8 hours.   BUN 20 8 - 23 mg/dL   Creatinine, Ser 8.87 (H) 0.44 - 1.00 mg/dL   Calcium  8.4 (L) 8.9 - 10.3 mg/dL   GFR, Estimated 50 (L) >60 mL/min    Comment: (NOTE) Calculated using the CKD-EPI Creatinine Equation (2021)    Anion gap 11 5 - 15    Comment: Performed at [facility], [address], [place], [place] [address]  CBC with Differential     Status: Abnormal   Collection Time: 05/26/24  5:30 PM  Result Value Ref Range   WBC 10.4 4.0 - 10.5 K/uL   RBC 3.60 (L) 3.87 - 5.11 MIL/uL   Hemoglobin 8.8 (L) 12.0 - 15.0 g/dL   HCT 69.1 (L)  36.0 - 46.0 %   MCV 85.6 80.0 - 100.0 fL   MCH 24.4 (L) 26.0 - 34.0 pg   MCHC 28.6 (L) 30.0 - 36.0 g/dL   RDW 84.1 (H) 88.4 - 84.4 %   Platelets 154 150 - 400 K/uL   nRBC 0.0 0.0 - 0.2 %   Neutrophils Relative % 87 %   Neutro Abs 8.9 (H) 1.7 - 7.7 K/uL   Lymphocytes Relative 6 %   Lymphs Abs 0.7 0.7 - 4.0 K/uL   Monocytes Relative 7 %   Monocytes Absolute 0.8 0.1 - 1.0 K/uL   Eosinophils Relative 0 %   Eosinophils Absolute 0.0 0.0 - 0.5 K/uL   Basophils Relative 0 %   Basophils Absolute 0.0 0.0 - 0.1 K/uL   Immature Granulocytes 0 %   Abs Immature Granulocytes 0.04 0.00 - 0.07 K/uL    Comment: Performed at [facility], [address] [place], [place] [address]  Protime-INR     Status: Abnormal   Collection Time: 05/26/24  5:30 PM  Result Value Ref Range   Prothrombin Time 15.4 (H) 11.4 - 15.2 seconds   INR 1.2 0.8 - 1.2    Comment: (NOTE) INR goal varies based on device and disease states. Performed at [facility], [address], [place], [place] [address]   Type and screen [facility]     Status: None   Collection Time: 05/26/24  5:30 PM  Result Value Ref Range   ABO/RH(D) O POS    Antibody Screen NEG    Sample Expiration      05/29/2024,2359 Performed  at [facility], [address] [place], [place] [address]   Magnesium      Status: Abnormal   Collection Time: 05/26/24  5:46 PM  Result Value Ref Range   Magnesium  1.1 (L) 1.7 - 2.4 mg/dL    Comment: Performed at [facility], [address], [place], [place] [address]  Glucose, capillary     Status: Abnormal   Collection Time: 05/27/24 12:04 AM  Result Value Ref Range   Glucose-Capillary 213 (H) 70 - 99 mg/dL    Comment: Glucose reference range applies only to samples taken after fasting for at least 8 hours.  Glucose, capillary     Status: Abnormal   Collection Time: 05/27/24  4:23 AM  Result Value Ref Range   Glucose-Capillary 106 (H) 70 - 99 mg/dL    Comment: Glucose reference range applies only to samples taken after fasting for at least 8 hours.  Magnesium      Status: None   Collection Time: 05/27/24  6:31 AM  Result Value Ref Range   Magnesium  2.2 1.7 - 2.4 mg/dL    Comment: Performed at [facility], [address], [place], [place] [address]  Comprehensive metabolic panel with GFR     Status: Abnormal   Collection Time: 05/27/24  6:31 AM  Result Value Ref Range   Sodium 142 135 - 145 mmol/L   Potassium 3.7 3.5 - 5.1 mmol/L   Chloride 108 98 - 111 mmol/L   CO2 25 22 - 32 mmol/L   Glucose, Bld 121 (H) 70 - 99 mg/dL    Comment: Glucose reference range applies only to samples taken after fasting for at least 8 hours.   BUN 13 8 - 23 mg/dL   Creatinine, Ser 9.07 0.44 - 1.00 mg/dL   Calcium  8.0 (L) 8.9 - 10.3 mg/dL  Total Protein 5.0 (L) 6.5 - 8.1 g/dL   Albumin 2.1 (L) 3.5 - 5.0 g/dL   AST 14 (L) 15 - 41 U/L   ALT 15 0 - 44 U/L   Alkaline Phosphatase 79 38 - 126 U/L   Total Bilirubin 0.5 0.0 - 1.2 mg/dL   GFR, Estimated >39 >39 mL/min    Comment: (NOTE) Calculated using the CKD-EPI Creatinine Equation (2021)    Anion gap 9 5 - 15    Comment: Performed at [facility], [address] [place], [place] [address]  CBC      Status: Abnormal   Collection Time: 05/27/24  6:31 AM  Result Value Ref Range   WBC 5.9 4.0 - 10.5 K/uL   RBC 2.96 (L) 3.87 - 5.11 MIL/uL   Hemoglobin 7.2 (L) 12.0 - 15.0 g/dL   HCT 74.8 (L) 63.9 - 53.9 %   MCV 84.8 80.0 - 100.0 fL   MCH 24.3 (L) 26.0 - 34.0 pg   MCHC 28.7 (L) 30.0 - 36.0 g/dL   RDW 84.1 (H) 88.4 - 84.4 %   Platelets 121 (L) 150 - 400 K/uL   nRBC 0.0 0.0 - 0.2 %    Comment: Performed at [facility], [address], [place], [place] [address]  VITAMIN D  25 Hydroxy (Vit-D Deficiency, Fractures)     Status: Abnormal   Collection Time: 05/27/24  6:31 AM  Result Value Ref Range   Vit D, 25-Hydroxy 28.18 (L) 30 - 100 ng/mL    Comment: (NOTE) Vitamin D  deficiency has been defined by the Institute of Medicine  and an Endocrine Society practice guideline as a level of serum 25-OH  vitamin D  less than 20 ng/mL (1,2). The Endocrine Society went on to  further define vitamin D  insufficiency as a level between 21 and 29  ng/mL (2).  1. IOM (Institute of Medicine). 2010. Dietary reference intakes for  calcium  and D. [place]: [employer]. 2. Holick MF, Binkley Arthur, Bischoff-Ferrari HA, et al. Evaluation,  treatment, and prevention of vitamin D  deficiency: an Endocrine  Society clinical practice guideline, JCEM. 2011 Jul; 96(7): 1911-30.  Performed at [facility], [address], [place], [place] [address]   Glucose, capillary     Status: Abnormal   Collection Time: 05/27/24  8:24 AM  Result Value Ref Range   Glucose-Capillary 135 (H) 70 - 99 mg/dL    Comment: Glucose reference range applies only to samples taken after fasting for at least 8 hours.    CT FEMUR LEFT WO CONTRAST Result Date: 05/26/2024 CLINICAL DATA:  Femur fracture EXAM: CT OF THE LOWER LEFT EXTREMITY WITHOUT CONTRAST TECHNIQUE: Multidetector CT imaging of the lower left extremity was performed according to the standard protocol. RADIATION DOSE REDUCTION: This exam was  performed according to the departmental dose-optimization program which includes automated exposure control, adjustment of the mA and/or kV according to patient size and/or use of iterative reconstruction technique. COMPARISON:  Plain films today FINDINGS: Bones/Joint/Cartilage Prior left hip replacement. There is a fracture through the midshaft of the left femur just below the hip prosthesis stem. Angulation and displacement. Ligaments Suboptimally assessed by CT. Muscles and Tendons Negative Soft tissues Edema throughout the subcutaneous soft tissues. IMPRESSION: Angulated and displaced midshaft left femoral fracture just below the left femoral prosthesis. Prior left hip replacement. Electronically Signed   By: Franky Crease M.D.   On: 05/26/2024 23:37   DG Tibia/Fibula Left Result Date: 05/26/2024  CLINICAL DATA:  Fall. EXAM: LEFT TIBIA AND FIBULA - 2 VIEW COMPARISON:  None Available. FINDINGS: Only AP image could be obtained. There is no evidence of fracture or other focal bone lesions. Soft tissues are unremarkable. IMPRESSION: No definite abnormality seen on this AP image only. Electronically Signed   By: Lynwood Landy Raddle M.D.   On: 05/26/2024 17:29   DG FEMUR MIN 2 VIEWS LEFT Result Date: 05/26/2024 CLINICAL DATA:  Fall. EXAM: LEFT FEMUR 2 VIEWS COMPARISON:  None Available. FINDINGS: Status post left total hip arthroplasty. Moderately angulated fracture of the midshaft of left femur is noted. IMPRESSION: Moderately angulated midshaft fracture of left femur. Electronically Signed   By: Lynwood Landy Raddle M.D.   On: 05/26/2024 17:28   DG Pelvis 1-2 Views Result Date: 05/26/2024 CLINICAL DATA:  Fall. EXAM: PELVIS - 1-2 VIEW COMPARISON:  September 05, 2021. FINDINGS: Status post left total hip arthroplasty. No fracture or dislocation is noted. IMPRESSION: No acute abnormality seen. Electronically Signed   By: Lynwood Landy Raddle M.D.   On: 05/26/2024 17:27   DG Chest 1 View Result Date: 05/26/2024 CLINICAL DATA:   Weakness and fall. EXAM: CHEST  1 VIEW COMPARISON:  Chest radiograph dated 10/14/2023. FINDINGS: Right-sided Port-A-Cath with tip at the cavoatrial junction. No focal consolidation, pleural effusion, pneumothorax. Stable cardiac silhouette. No acute osseous pathology. IMPRESSION: No active disease. Electronically Signed   By: Vanetta Chou M.D.   On: 05/26/2024 17:24    Review of Systems  HENT:  Negative for ear discharge, ear pain, hearing loss and tinnitus.   Eyes:  Negative for photophobia and pain.  Respiratory:  Negative for cough and shortness of breath.   Cardiovascular:  Negative for chest pain.  Gastrointestinal:  Negative for abdominal pain, nausea and vomiting.  Genitourinary:  Negative for dysuria, flank pain, frequency and urgency.  Musculoskeletal:  Positive for arthralgias (Left thigh). Negative for back pain, myalgias and neck pain.  Neurological:  Negative for dizziness and headaches.  Hematological:  Does not bruise/bleed easily.  Psychiatric/Behavioral:  The patient is not nervous/anxious.    Blood pressure (!) 136/43, pulse 76, temperature 98.4 F (36.9 C), temperature source Oral, resp. rate 16, height 5' (1.524 m), weight 70 kg, SpO2 95%. Physical Exam Constitutional:      General: She is not in acute distress.    Appearance: She is well-developed. She is not diaphoretic.  HENT:     Head: Normocephalic and atraumatic.  Eyes:     General: No scleral icterus.       Right eye: No discharge.        Left eye: No discharge.     Conjunctiva/sclera: Conjunctivae normal.  Cardiovascular:     Rate and Rhythm: Normal rate and regular rhythm.  Pulmonary:     Effort: Pulmonary effort is normal. No respiratory distress.  Musculoskeletal:     Cervical back: Normal range of motion.     Comments: LLE No traumatic wounds, ecchymosis, or rash  In [place]  No knee effusion  Sens DPN, SPN, TN intact  Motor EHL, ext, flex, evers 5/5  DP 0, No significant edema  Skin:     General: Skin is warm and dry.  Neurological:     Mental Status: She is alert.  Psychiatric:        Mood and Affect: Mood normal.        Behavior: Behavior normal.     Assessment/Plan: Left femur fx -- Plan ORIF today with Dr. Kendal.  Please keep NPO. Multiple medical problems including T2DM, HLD, HTN, cervical and rectal cancer s/p colostomy, PAD, diabetic foot ulcer,, neuropathy, hypothyroidism, hypomagnesemia, anemia and arthritis -- per primary service    Ozell DOROTHA Ned, PA-C Orthopedic Surgery [phone] 05/27/2024, 9:28 AM

## 2024-05-27 NOTE — Anesthesia Preprocedure Evaluation (Signed)
 Anesthesia Evaluation  Patient identified by MRN, date of birth, ID band Patient awake    Reviewed: Allergy & Precautions, H&P , NPO status , Patient's Chart, lab work & pertinent test results  Airway Mallampati: II   Neck ROM: full    Dental   Pulmonary former smoker   breath sounds clear to auscultation       Cardiovascular hypertension, + Peripheral Vascular Disease and +CHF   Rhythm:regular Rate:Normal     Neuro/Psych  Neuromuscular disease CVA    GI/Hepatic PUD,,,  Endo/Other  diabetes, Type 2Hypothyroidism    Renal/GU      Musculoskeletal  (+) Arthritis ,    Abdominal   Peds  Hematology  (+) Blood dyscrasia, anemia   Anesthesia Other Findings   Reproductive/Obstetrics                              Anesthesia Physical Anesthesia Plan  ASA: 3  Anesthesia Plan: General   Post-op Pain Management:    Induction: Intravenous  PONV Risk Score and Plan: 3 and Ondansetron , Dexamethasone , Midazolam  and Treatment may vary due to age or medical condition  Airway Management Planned: Oral ETT  Additional Equipment:   Intra-op Plan:   Post-operative Plan: Extubation in OR  Informed Consent: I have reviewed the patients History and Physical, chart, labs and discussed the procedure including the risks, benefits and alternatives for the proposed anesthesia with the patient or authorized representative who has indicated his/her understanding and acceptance.     Dental advisory given  Plan Discussed with: CRNA, Anesthesiologist and Surgeon  Anesthesia Plan Comments:         Anesthesia Quick Evaluation

## 2024-05-27 NOTE — Discharge Instructions (Addendum)
 Orthopa[facility] Trauma Service Discharge Instructions   General Discharge Instructions  WEIGHT BEARING STATUS:weightbearing as tolerated  RANGE OF MOTION/ACTIVITY:Ok for unrestricted motion of hip and knee  Wound Care: You may remove your surgical dressing on post op day 2 (Sunday 05/29/24). Incisions can be left open to air if there is no drainage. Once the incision is completely dry and without drainage, it may be left open to air out.  Showering may begin post op day 3 (Monday 05/30/24).  Clean incision gently with soap and water.  DVT/PE prophylaxis: Lovenox   Diet: as you were eating previously.  Can use over the counter stool softeners and bowel preparations, such as Miralax , to help with bowel movements.  Narcotics can be constipating.  Be sure to drink plenty of fluids  PAIN MEDICATION USE AND EXPECTATIONS  You have likely been given narcotic medications to help control your pain.  After a traumatic event that results in an fracture (broken bone) with or without surgery, it is ok to use narcotic pain medications to help control one's pain.  We understand that everyone responds to pain differently and each individual patient will be evaluated on a regular basis for the continued need for narcotic medications. Ideally, narcotic medication use should last no more than 6-8 weeks (coinciding with fracture healing).   As a patient it is your responsibility as well to monitor narcotic medication use and report the amount and frequency you use these medications when you come to your office visit.   We would also advise that if you are using narcotic medications, you should take a dose prior to therapy to maximize you participation.  IF YOU ARE ON NARCOTIC MEDICATIONS IT IS NOT PERMISSIBLE TO OPERATE A MOTOR VEHICLE (MOTORCYCLE/CAR/TRUCK/MOPED) OR HEAVY MACHINERY DO NOT MIX NARCOTICS WITH OTHER CNS (CENTRAL NERVOUS SYSTEM) DEPRESSANTS SUCH AS ALCOHOL  POST-OPERATIVE OPIOID TAPER INSTRUCTIONS: It  is important to wean off of your opioid medication as soon as possible. If you do not need pain medication after your surgery it is ok to stop day one. Opioids include: Codeine, Hydrocodone (Norco, Vicodin), Oxycodone (Percocet, oxycontin ) and hydromorphone  amongst others.  Long term and even short term use of opiods can cause: Increased pain response Dependence Constipation Depression Respiratory depression And more.  Withdrawal symptoms can include Flu like symptoms Nausea, vomiting And more Techniques to manage these symptoms Hydrate well Eat regular healthy meals Stay active Use relaxation techniques(deep breathing, meditating, yoga) Do Not substitute Alcohol to help with tapering If you have been on opioids for less than two weeks and do not have pain than it is ok to stop all together.  Plan to wean off of opioids This plan should start within one week post op of your fracture surgery  Maintain the same interval or time between taking each dose and first decrease the dose.  Cut the total daily intake of opioids by one tablet each day Next start to increase the time between doses. The last dose that should be eliminated is the evening dose.    STOP SMOKING OR USING NICOTINE PRODUCTS!!!!  As discussed nicotine severely impairs your body's ability to heal surgical and traumatic wounds but also impairs bone healing.  Wounds and bone heal by forming microscopic blood vessels (angiogenesis) and nicotine is a vasoconstrictor (essentially, shrinks blood vessels).  Therefore, if vasoconstriction occurs to these microscopic blood vessels they essentially disappear and are unable to deliver necessary nutrients to the healing tissue.  This is one modifiable factor that you can do  to dramatically increase your chances of healing your injury.  (This means no smoking, no nicotine gum, patches, etc)  DO NOT USE NONSTEROIDAL ANTI-INFLAMMATORY DRUGS (NSAID'S)  Using products such as Advil  (ibuprofen), Aleve (naproxen), Motrin (ibuprofen) for additional pain control during fracture healing can delay and/or prevent the healing response.  If you would like to take over the counter (OTC) medication, Tylenol  (acetaminophen ) is ok.  However, some narcotic medications that are given for pain control contain acetaminophen  as well. Therefore, you should not exceed more than 4000 mg of tylenol  in a day if you do not have liver disease.  Also note that there are may OTC medicines, such as cold medicines and allergy medicines that my contain tylenol  as well.  If you have any questions about medications and/or interactions please ask your doctor/PA or your pharmacist.      ICE AND ELEVATE INJURED/OPERATIVE EXTREMITY  Using ice and elevating the injured extremity above your heart can help with swelling and pain control.  Icing in a pulsatile fashion, such as 20 minutes on and 20 minutes off, can be followed.    Do not place ice directly on skin. Make sure there is a barrier between to skin and the ice pack.    Using frozen items such as frozen peas works well as the conform nicely to the are that needs to be iced.  USE AN ACE WRAP OR TED HOSE FOR SWELLING CONTROL  In addition to icing and elevation, Ace wraps or TED hose are used to help limit and resolve swelling.  It is recommended to use Ace wraps or TED hose until you are informed to stop.    When using Ace Wraps start the wrapping distally (farthest away from the body) and wrap proximally (closer to the body)   Example: If you had surgery on your leg or thing and you do not have a splint on, start the ace wrap at the toes and work your way up to the thigh        If you had surgery on your upper extremity and do not have a splint on, start the ace wrap at your fingers and work your way up to the upper arm    CALL THE OFFICE FOR MEDICATION REFILLS OR WITH ANY QUESTIONS/CONCERNS: [phone]   VISIT OUR WEBSITE FOR ADDITIONAL INFORMATION:  [email or web address]    Discharge Wound Care Instructions  Do NOT apply any ointments, solutions or lotions to pin sites or surgical wounds.  These prevent needed drainage and even though solutions like hydrogen peroxide kill bacteria, they also damage cells lining the pin sites that help fight infection.  Applying lotions or ointments can keep the wounds moist and can cause them to breakdown and open up as well. This can increase the risk for infection. When in doubt call the office.  Surgical incisions should be dressed daily.  If any drainage is noted, use one layer of adaptic or Mepitel, then gauze, Kerlix, and an ace wrap. - These dressing supplies should be available at local medical supply stores ([facility], [facility], etc) as well as Insurance claims handler ([facility], [facility], [facility], etc)  Once the incision is completely dry and without drainage, it may be left open to air out.  Showering may begin 36-48 hours later.  Cleaning gently with soap and water.  Traumatic wounds should be dressed daily as well.    One layer of adaptic, gauze, Kerlix, then ace wrap.  The adaptic can be discontinued once  the draining has ceased    If you have a wet to dry dressing: wet the gauze with saline the squeeze as much saline out so the gauze is moist (not soaking wet), place moistened gauze over wound, then place a dry gauze over the moist one, followed by Kerlix wrap, then ace wrap.    Call office for the following: Temperature greater than 101F Persistent nausea and vomiting Severe uncontrolled pain Redness, tenderness, or signs of infection (pain, swelling, redness, odor or green/yellow discharge around the site) Difficulty breathing, headache or visual disturbances Hives Persistent dizziness or light-headedness Extreme fatigue Any other questions or concerns you may have after discharge  In an emergency, call 911 or go to an Emergency Department at a nearby hospital  OTHER HELPFUL  INFORMATION  If you had a block, it will wear off between 8-24 hrs postop typically.  This is period when your pain may go from nearly zero to the pain you would have had postop without the block.  This is an abrupt transition but nothing dangerous is happening.  You may take an extra dose of narcotic when this happens.  You should wean off your narcotic medicines as soon as you are able.  Most patients will be off or using minimal narcotics before their first postop appointment.   We suggest you use the pain medication the first night prior to going to bed, in order to ease any pain when the anesthesia wears off. You should avoid taking pain medications on an empty stomach as it will make you nauseous.  Do not drink alcoholic beverages or take illicit drugs when taking pain medications.  In most states it is against the law to drive while you are in a splint or sling.  And certainly against the law to drive while taking narcotics.  You may return to work/school in the next couple of days when you feel up to it.   Pain medication may make you constipated.  Below are a few solutions to try in this order: Decrease the amount of pain medication if you aren't having pain. Drink lots of decaffeinated fluids. Drink prune juice and/or each dried prunes  If the first 3 don't work start with additional solutions Take Colace - an over-the-counter stool softener Take Senokot - an over-the-counter laxative Take Miralax  - a stronger over-the-counter laxative

## 2024-05-27 NOTE — Interval H&P Note (Signed)
 History and Physical Interval Note:  05/27/2024 12:48 PM  [name]  has presented today for surgery, with the diagnosis of left distal femur fracture.  The various methods of treatment have been discussed with the patient and family. After consideration of risks, benefits and other options for treatment, the patient has consented to  Procedure(s): OPEN REDUCTION INTERNAL FIXATION (ORIF) DISTAL FEMUR FRACTURE (Left) as a surgical intervention.  The patient's history has been reviewed, patient examined, no change in status, stable for surgery.  I have reviewed the patient's chart and labs.  Questions were answered to the patient's satisfaction.     [name] P Tayon Parekh

## 2024-05-27 NOTE — Transfer of Care (Signed)
 Immediate Anesthesia Transfer of Care Note  Patient: [name]  Procedure(s) Performed: OPEN REDUCTION INTERNAL FIXATION (ORIF) DISTAL FEMUR FRACTURE (Left)  Patient Location: PACU  Anesthesia Type:General  Level of Consciousness: drowsy  Airway & Oxygen Therapy: Patient Spontanous Breathing and Patient connected to face mask  Post-op Assessment: Report given to RN and Post -op Vital signs reviewed and stable  Post vital signs: Reviewed and stable  Last Vitals:  Vitals Value Taken Time  BP 174/96 05/27/24 15:31  Temp 97.6   Pulse 75 05/27/24 15:35  Resp 18 05/27/24 15:35  SpO2 93 % 05/27/24 15:35  Vitals shown include unfiled device data.  Last Pain:  Vitals:   05/27/24 1149  TempSrc:   PainSc: 10-Worst pain ever         Complications: No notable events documented.

## 2024-05-27 NOTE — Anesthesia Procedure Notes (Signed)
 Central Venous Catheter Insertion Performed by: Maryclare Cornet, MD, anesthesiologist Start/End7/09/2024 1:50 PM, 05/27/2024 1:58 PM Patient location: Pre-op. Preanesthetic checklist: patient identified, IV checked, site marked, risks and benefits discussed, surgical consent, monitors and equipment checked, pre-op evaluation, timeout performed and anesthesia consent Position: Trendelenburg Patient sedated Hand hygiene performed , maximum sterile barriers used  and Seldinger technique used Catheter size: 7 Fr Central line was placed.Double lumen Procedure performed using ultrasound guided technique. Ultrasound Notes:anatomy identified, needle tip was noted to be adjacent to the nerve/plexus identified, no ultrasound evidence of intravascular and/or intraneural injection and image(s) printed for medical record Attempts: 1 Following insertion, line sutured, dressing applied and Biopatch. Post procedure assessment: blood return through all ports, free fluid flow and no air  Patient tolerated the procedure well with no immediate complications.

## 2024-05-27 NOTE — Progress Notes (Signed)
 Transition of Care [facility]) - CAGE-AID Screening   Patient Details  Name: [name] MRN: [id number] Date of Birth: 27-Aug-1946  Transition of Care [facility]) CM/SW Contact:    Bernardino Mayotte, RN Phone Number: 05/27/2024, 8:21 PM   Clinical Narrative:  Patient denies the use of alcohol and illicit drugs. Resources not given at this time.  CAGE-AID Screening:    Have You Ever Felt You Ought to Cut Down on Your Drinking or Drug Use?: No Have People Annoyed You By Critizing Your Drinking Or Drug Use?: No Have You Felt Bad Or Guilty About Your Drinking Or Drug Use?: No Have You Ever Had a Drink or Used Drugs First Thing In The Morning to Steady Your Nerves or to Get Rid of a Hangover?: No CAGE-AID Score: 0  Substance Abuse Education Offered: No

## 2024-05-27 NOTE — Progress Notes (Addendum)
 Subjective:  Patient ID: [name], female    DOB: May 14, 1946,  MRN: [id number]  [name] presents to clinic today for at risk footcare. Patient has h/o diabetes, neuropathy and PAD and is seen for preulcerative lesion(s) of both feet and painful mycotic toenails that limit ambulation. Painful toenails interfere with ambulation. Aggravating factors include wearing enclosed shoe gear. Pain is relieved with periodic professional debridement. Painful preulcerative lesion(s) is/are aggravated when weightbearing with and without shoegear. Pain is relieved with periodic professional debridement.  Chief Complaint  Patient presents with   Diabetes    Patient is here for routine DFC/trim, patient also has left heel pressure ulcer with infection and odor Dr.Patel will be taking over care   Patient has known stage 2 pressure ulcer posterior left heel which is now infected. Will be managed by Dr. Franky Blanch. She is to have Vascular intervention on 06/09/2024.  PCP is Waylan Almarie SAUNDERS, MD.  Allergies  Allergen Reactions   Codeine Hives and Nausea Only    Review of Systems: Negative except as noted in the HPI.  Objective: No changes noted in today's physical examination. There were no vitals filed for this visit. [name] ROTOLO is a pleasant 78 y.o. female frail, in NAD. AAO x 3. Vascular:  Capillary fill time to digits <3 seconds b/l lower extremities. Pedal pulses diminished RLE. Edema RLE.  Pedal hair absent. Lower extremity skin temperature gradient within normal limits. No pain with calf compression b/l. No ischemia or gangrene noted b/l LE. No cyanosis or clubbing noted b/l LE.  Dermatological:  Skin warm and supple b/l lower extremities.  No interdigital macerations b/l lower extremities.   Toenails 1-5 b/l elongated, discolored, dystrophic, thickened, crumbly with subungual debris and tenderness to dorsal palpation. Old injury noted right 2nd digit toenail.    Preulcerative lesion(s) sub IPJ R hallux.  No erythema, no edema, no drainage, no fluctuance.   Preulcerative lesion sub 1 right foot and plantar IPJ left hallux no with underlying fluctuance. No surrounding erythema, edema, or drainage. No tracking/tunneling into deep tissues.  She has dressing on left heel which is clean dry and intact.  Musculoskeletal:  Normal muscle strength 5/5 to all lower extremity muscle groups bilaterally. No pain crepitus or joint limitation noted with ROM b/l lower extremities.   Hammertoe(s) noted to the L 2nd toe and R 2nd toe.   Neurological:  Protective sensation diminished with 10g monofilament b/l. Vibratory sensation decreased b/l.  Assessment/Plan: 1. Pain due to onychomycosis of toenails of both feet   2. Pre-ulcerative calluses   3. Pressure ulcer of left heel, stage 2 (HCC)   4. PAD (peripheral artery disease) (HCC)   5. Diabetic peripheral neuropathy associated with type 2 diabetes mellitus (HCC)   -Left heel managed by Dr. Franky Blanch. Please see his comments/treatment plan. She is on cefpodoxime  200 mg po bid for kidney stone. -Patient's family member present. All questions/concerns addressed on today's visit. -Consent given for treatment as described below: -Family member/caregiver/POA instructed to continue pressure precautions. Apply heel protectors when patient is in bed. -Patient to continue soft, supportive shoe gear daily. -Preulcerative lesion pared plantar IPJ of left great toe and submet head 1 debrided gently with dremel file. Total number pared=2. -Toenails 1-5 b/l were debrided in length and girth with sterile nail nippers and dremel without iatrogenic bleeding.  -Patient/POA to call should there be question/concern in the interim.    Left left heel ulcer - Questions or concerns were  discussed with the patient in extensive detail - No clinical signs of infection noted no purulent drainage noted appears to be stable. -Patient  will follow-up with Dr. Debera for further management.  Return in about 3 months (around 08/25/2024) for at risk foot care with me. SABRA Delon LITTIE Gaynel, DPM      [facility] LOCATION: [address], [place] [address]                   Office [phone]   [facility] LOCATION: [address] [place], [place] [address] Office [phone]

## 2024-05-27 NOTE — Hospital Course (Addendum)
 [name] is a 78 y.o. female with medical history significant for T2DM, HLD, HTN, cervical and rectal cancer s/p colostomy, PAD, diabetic foot ulcer,, neuropathy, hypothyroidism, hypomagnesemia, anemia and arthritis who presented to the ED after a fall at home.  Per son, after returning from PCP office, patient had 3 episodes where she lost her balance while ambulating he was able to catch her each time.  Daughter reports that patient was trying to straighten her bed, lost her balance and fell face down on the bed. Patient's left leg was bent and twisted behind her and as daughter attempted to straighten it, she heard a pop. Patient reported significant pain in the leg so she called EMS. At baseline, patient ambulates with a walker however recently, she has been losing strength in her legs. Patient has chronic lower extremity swelling and neuropathy but has had recent increased redness to her legs, L>R.  She she has had poor appetite but denies any dizziness, recent illness, fevers, chills, nausea or vomiting. She was recently treated for possible UTI about a week ago.   ED Course: Initial vitals show patient hypertensive with SBP in the 140s to 190s, otherwise normal vitals. Initial labs significant for glucose 196, creatinine 1.12, WBC 10.4, Hgb 8.8, normal INR. EKG shows normal sinus rhythm. X-ray of the left femur shows moderately angulated midshaft fracture of the left femur.  Pt received fentanyl  100 mcg x 1. Orthopedic surgery was consulted for evaluation. A Buck's traction was placed.  TRH was consulted for admission.   **Interim History: She was taken for surgical intervention and postoperatively she was very somnolent and had a new oxygen requirement.  We transferred her to the PCU.  Subsequently she improved and has been weaned off of oxygen today.  PT OT to further evaluate and treat and currently continues to have some pain.  Assessment and Plan:  Mechanical fall and  Left femur  fracture:  Elderly patient presented with left leg pain after a mechanical fall at home. X-ray of left femur shows moderately angulated midshaft fracture of the left femur.  Orthopedic surgery consulted and she is s/p ORIF now. Admitted [facility] at [facility] but transferred to [facility];  Buck traction placed in the ED.  Pain control oxycodone  and as needed IV Dilaudid . Fall precautions; Will need further care per Ortho and they are recommending weightbearing as tolerated and reinforce the dressings as needed.  They have initiated on enoxaparin  for VTE prophylaxis and they are recommending PT OT to evaluate her today.   Lower extremity cellulitis: Patient with chronic lower extremity edema found to have mild cellulitis of the lower extremities; Start IV Ancef  and being continued; CTM and Trend CBC, fever curve  Acute Respiratory Failure w/ Hypoxia: Post Op and ? Related to Anesthesia. SpO2: 94 % O2 Flow Rate (L/min): 2 L/min FiO2 (%): 21 % -Given IV Lasix  in the PACU. Check CXR and showed Stable cardiomediastinal silhouette. Right internal jugular Port-A-Cath is unchanged. Interval placement of left internal jugular catheter with distal tip in expected position of left brachiocephalic vein. No acute pulmonary disease is noted. Bony thorax is unremarkable. No pneumothorax is seen.   Type 2 Diabetes Mellitus complicated by Neuropathy:  Last A1c 6.5% 3 months ago;  Blood sugar elevated to 196 on BMP. Hold metformin  in the setting of pending surgery. Q4H SSI with CBG monitoring while NPO. Follow-up repeat A1c. CBG Trend:  Recent Labs  Lab 05/28/24 0048 05/28/24 0420 05/28/24 0656 05/28/24 0814 05/28/24  1139 05/28/24 1453 05/28/24 1620  GLUCAP 367* 208* 161* 140* 304* 342* 315*  -C/w Gabapentin  600 mg po Daily and Gabapentin  300 mg po qHS   Diabetic Foot Ulcer / Nonhealing pressure ulcer:  Patient with full-thickness ulcer at the posterior left heel with large eschar (see media tab);  No  significant improvement in left heel ulcer over the last few weeks.  Followed by podiatry and wound care. Wound care consulted, appreciate recs; Angiogram as below   PAD: ABIs 02/16/2024 are 0.85 on the right biphasic and 1.01 on the left biphasic. Follows with vascular surgery with plan for aortogram with LLE angiogram due to worsening left foot ulcer and toe pressure of 0.  Vascular surgery consulted by Ortho in regards to possible intervention during this hospitalization and plan is for Angiogram early next week. -Continue Atorvastatin  as below   Hydronephrosis:  CT A/P 5 days ago showed bilateral hydronephrosis, R>L but no visible obstructing stone; Hydronephrosis thought to be due to patient's rectal cancer. Checked Renal U/S and showed  Mild right hydronephrosis which hasimproved since prior CT examination and Interval resolution of left Hydronephrosis. There was also Moderate right and mild left renal cortical atrophy. Will hold off Urological Consult given her improvement    Chronic Hypomagnesemia:  Unclear etiology, receives intermittent IV magnesium  by cancer center. Magnesium  low at 1.1 on admission.  Current magnesium  level trend: Recent Labs  Lab 05/02/24 1132 05/26/24 1746 05/27/24 0631 05/28/24 0300  MG 0.9* 1.1* 2.2 1.5*  - Replete with IV mag sulfate 2 g now.  CTM and Trend and repeat Mag Level in the AM   Essential HTN:  BP elevated with SBP in the 140s to 170s. C/w IV hydralazine  PRN for SBP > 180. CTM for S/Sx of Bleeding; CTM BP per Protocol. Last BP reading was 142/50   Cervical Cancer / Rectal cancer s/p colostomy: Followed closely with oncology, patient has declined further chemotherapy or surgery for removal of the remaining tumor.  Continue Vitamin Supplementation   Normocytic Anemia / IDA - Hgb stable at 8.8, baseline 8-9 -Hgb/Hct Trend:  Recent Labs  Lab 05/02/24 1132 05/21/24 1409 05/26/24 1730 05/27/24 0631 05/28/24 0300  HGB 9.4* 9.1* 8.8* 7.2* 10.0*  HCT  31.5* 31.3* 30.8* 25.1* 31.8*  MCV 83.3 85.8 85.6 84.8 84.8  -Checked Anemia Panel and showed an iron level of 11, UIBC 172, TIBC of 183, saturation ratios of 6%, ferritin level 114, folate level 9.3, and vitamin B12 374.  Continue iron supplementation w/ Ferrous Sulfate  325 mg po Daily -CTM for S/Sx of Bleeding; No overt bleeding noted and repeat CBC in the AM   Chronic BLE edema: Thought to be due to lymphedema;  Apply TED hose however her left leg is now wrapped   HLD: C/w Atorvastatin  40 mg po qHS   Hypothyroidism: C/w Levothyroxine  137 mcg po Daily   GERD/GI Prophylaxis: Continue home PPI w/ Pantoprazole  40 mg po Daily   Mood Disorder:  Continue Sertraline  150 mg po Daily  Hypoalbuminemia: Patient's Albumin Lvl is now 2.0. CTM and Trend and repeat CMP in the AM  Class I Obesity: Complicates overall prognosis and care. Estimated body mass index is 31.82 kg/m as calculated from the following:   Height as of this encounter: 5' (1.524 m).   Weight as of this encounter: 73.9 kg. Weight Loss and Dietary Counseling given

## 2024-05-27 NOTE — Consult Note (Addendum)
 WOC Nurse Consult Note: patient is followed by podiatry and vascular surgery for L heel Pressure Injury; patient using Medihoney per notes  Reason for Consult: L heel ulcer  Wound type: Unstageable Pressure Injury L heel in a patient with known PVD  Pressure Injury POA: Yes Measurement:  see nursing flowsheet; last podiatry note 05/19/2024 2.5 cm x 2 cm x 0.6 cm post debridement  Wound azi:zdryjm  Drainage (amount, consistency, odor) some reports of foul smelling from podiatry and vascular notes  Periwound: callused  Dressing procedure/placement/frequency: Cleanse L heel wound with Vashe wound cleanser Soila [phone]) do not rinse and allow to air dry.  Apply Medihoney to wound bed daily, cover with dry gauze and secure dressing with Kerlix roll gauze. Place L foot in Prevalon boot to offload pressure Soila [phone]).   Patient should continue to follow with podiatry and vascular surgeon as outpatient.    Thank you,    Powell Bar MSN, RN-BC, [employer]

## 2024-05-28 DIAGNOSIS — L03116 Cellulitis of left lower limb: Secondary | ICD-10-CM | POA: Diagnosis not present

## 2024-05-28 DIAGNOSIS — S7292XA Unspecified fracture of left femur, initial encounter for closed fracture: Secondary | ICD-10-CM | POA: Diagnosis not present

## 2024-05-28 DIAGNOSIS — W19XXXA Unspecified fall, initial encounter: Secondary | ICD-10-CM | POA: Diagnosis not present

## 2024-05-28 DIAGNOSIS — L03115 Cellulitis of right lower limb: Secondary | ICD-10-CM | POA: Diagnosis not present

## 2024-05-28 LAB — COMPREHENSIVE METABOLIC PANEL WITH GFR
ALT: 14 U/L (ref 0–44)
AST: 24 U/L (ref 15–41)
Albumin: 2 g/dL — ABNORMAL LOW (ref 3.5–5.0)
Alkaline Phosphatase: 79 U/L (ref 38–126)
Anion gap: 9 (ref 5–15)
BUN: 16 mg/dL (ref 8–23)
CO2: 25 mmol/L (ref 22–32)
Calcium: 7.8 mg/dL — ABNORMAL LOW (ref 8.9–10.3)
Chloride: 105 mmol/L (ref 98–111)
Creatinine, Ser: 1.22 mg/dL — ABNORMAL HIGH (ref 0.44–1.00)
GFR, Estimated: 45 mL/min — ABNORMAL LOW (ref >60)
Glucose, Bld: 276 mg/dL — ABNORMAL HIGH (ref 70–99)
Potassium: 3.8 mmol/L (ref 3.5–5.1)
Sodium: 139 mmol/L (ref 135–145)
Total Bilirubin: 0.4 mg/dL (ref 0.0–1.2)
Total Protein: 4.7 g/dL — ABNORMAL LOW (ref 6.5–8.1)

## 2024-05-28 LAB — GLUCOSE, CAPILLARY
Glucose-Capillary: 140 mg/dL — ABNORMAL HIGH (ref 70–99)
Glucose-Capillary: 161 mg/dL — ABNORMAL HIGH (ref 70–99)
Glucose-Capillary: 208 mg/dL — ABNORMAL HIGH (ref 70–99)
Glucose-Capillary: 219 mg/dL — ABNORMAL HIGH (ref 70–99)
Glucose-Capillary: 304 mg/dL — ABNORMAL HIGH (ref 70–99)
Glucose-Capillary: 315 mg/dL — ABNORMAL HIGH (ref 70–99)
Glucose-Capillary: 342 mg/dL — ABNORMAL HIGH (ref 70–99)
Glucose-Capillary: 367 mg/dL — ABNORMAL HIGH (ref 70–99)

## 2024-05-28 LAB — BPAM RBC
Blood Product Expiration Date: 202508062359
Blood Product Expiration Date: 202508092359
ISSUE DATE / TIME: 202507111401
ISSUE DATE / TIME: 202507111401
Unit Type and Rh: 5100
Unit Type and Rh: 5100

## 2024-05-28 LAB — CBC WITH DIFFERENTIAL/PLATELET
Abs Immature Granulocytes: 0.04 K/uL (ref 0.00–0.07)
Basophils Absolute: 0 K/uL (ref 0.0–0.1)
Basophils Relative: 0 %
Eosinophils Absolute: 0 K/uL (ref 0.0–0.5)
Eosinophils Relative: 0 %
HCT: 31.8 % — ABNORMAL LOW (ref 36.0–46.0)
Hemoglobin: 10 g/dL — ABNORMAL LOW (ref 12.0–15.0)
Immature Granulocytes: 0 %
Lymphocytes Relative: 5 %
Lymphs Abs: 0.5 K/uL — ABNORMAL LOW (ref 0.7–4.0)
MCH: 26.7 pg (ref 26.0–34.0)
MCHC: 31.4 g/dL (ref 30.0–36.0)
MCV: 84.8 fL (ref 80.0–100.0)
Monocytes Absolute: 0.6 K/uL (ref 0.1–1.0)
Monocytes Relative: 6 %
Neutro Abs: 7.9 K/uL — ABNORMAL HIGH (ref 1.7–7.7)
Neutrophils Relative %: 89 %
Platelets: 149 K/uL — ABNORMAL LOW (ref 150–400)
RBC: 3.75 MIL/uL — ABNORMAL LOW (ref 3.87–5.11)
RDW: 15.5 % (ref 11.5–15.5)
WBC: 9 K/uL (ref 4.0–10.5)
nRBC: 0 % (ref 0.0–0.2)

## 2024-05-28 LAB — TYPE AND SCREEN
ABO/RH(D): O POS
Antibody Screen: NEGATIVE
Unit division: 0
Unit division: 0

## 2024-05-28 LAB — MAGNESIUM: Magnesium: 1.5 mg/dL — ABNORMAL LOW (ref 1.7–2.4)

## 2024-05-28 LAB — RETICULOCYTES
Immature Retic Fract: 18 % — ABNORMAL HIGH (ref 2.3–15.9)
RBC.: 3.74 MIL/uL — ABNORMAL LOW (ref 3.87–5.11)
Retic Count, Absolute: 42.3 K/uL (ref 19.0–186.0)
Retic Ct Pct: 1.1 % (ref 0.4–3.1)

## 2024-05-28 LAB — VITAMIN B12: Vitamin B-12: 374 pg/mL (ref 180–914)

## 2024-05-28 LAB — IRON AND TIBC
Iron: 11 ug/dL — ABNORMAL LOW (ref 28–170)
Saturation Ratios: 6 % — ABNORMAL LOW (ref 10.4–31.8)
TIBC: 183 ug/dL — ABNORMAL LOW (ref 250–450)
UIBC: 172 ug/dL

## 2024-05-28 LAB — FOLATE: Folate: 9.3 ng/mL (ref >5.9)

## 2024-05-28 LAB — FERRITIN: Ferritin: 114 ng/mL (ref 11–307)

## 2024-05-28 LAB — PHOSPHORUS: Phosphorus: 2.9 mg/dL (ref 2.5–4.6)

## 2024-05-28 MED ORDER — OXYCODONE HCL 5 MG PO TABS: 2.5000 mg | ORAL_TABLET | ORAL | 0 refills | Status: DC | PRN

## 2024-05-28 MED ORDER — ENOXAPARIN SODIUM 40 MG/0.4ML IJ SOSY: 40.0000 mg | PREFILLED_SYRINGE | INTRAMUSCULAR | 0 refills | Status: DC

## 2024-05-28 MED ORDER — MAGNESIUM SULFATE 2 GM/50ML IV SOLN
2.0000 g | Freq: Once | INTRAVENOUS | Status: AC
  Administered 2024-05-28: 2 g via INTRAVENOUS
  Filled 2024-05-28: qty 50

## 2024-05-28 MED ORDER — CHLORHEXIDINE GLUCONATE CLOTH 2 % EX PADS
6.0000 | MEDICATED_PAD | Freq: Every day | CUTANEOUS | Status: DC
  Administered 2024-05-28 – 2024-06-08 (×12): 6 via TOPICAL

## 2024-05-28 MED ORDER — HYDROMORPHONE HCL 1 MG/ML IJ SOLN
1.0000 mg | Freq: Four times a day (QID) | INTRAMUSCULAR | Status: DC | PRN
  Administered 2024-05-30 – 2024-06-01 (×4): 1 mg via INTRAVENOUS
  Filled 2024-05-28 (×4): qty 1

## 2024-05-28 MED ORDER — OXYCODONE HCL 5 MG PO TABS
2.5000 mg | ORAL_TABLET | Freq: Four times a day (QID) | ORAL | Status: DC | PRN
  Administered 2024-05-28 – 2024-06-07 (×13): 5 mg via ORAL
  Filled 2024-05-28 (×13): qty 1

## 2024-05-28 NOTE — Progress Notes (Signed)
 PROGRESS NOTE    [name]  FMW:[id number] DOB: Aug 29, 1946 DOA: 05/26/2024 PCP: Waylan Almarie SAUNDERS, MD   Brief Narrative:  [name] is a 78 y.o. female with medical history significant for T2DM, HLD, HTN, cervical and rectal cancer s/p colostomy, PAD, diabetic foot ulcer,, neuropathy, hypothyroidism, hypomagnesemia, anemia and arthritis who presented to the ED after a fall at home.  Per son, after returning from PCP office, patient had 3 episodes where she lost her balance while ambulating he was able to catch her each time.  Daughter reports that patient was trying to straighten her bed, lost her balance and fell face down on the bed. Patient's left leg was bent and twisted behind her and as daughter attempted to straighten it, she heard a pop. Patient reported significant pain in the leg so she called EMS. At baseline, patient ambulates with a walker however recently, she has been losing strength in her legs. Patient has chronic lower extremity swelling and neuropathy but has had recent increased redness to her legs, L>R.  She she has had poor appetite but denies any dizziness, recent illness, fevers, chills, nausea or vomiting. She was recently treated for possible UTI about a week ago.   ED Course: Initial vitals show patient hypertensive with SBP in the 140s to 190s, otherwise normal vitals. Initial labs significant for glucose 196, creatinine 1.12, WBC 10.4, Hgb 8.8, normal INR. EKG shows normal sinus rhythm. X-ray of the left femur shows moderately angulated midshaft fracture of the left femur.  Pt received fentanyl  100 mcg x 1. Orthopedic surgery was consulted for evaluation. A Buck's traction was placed.  TRH was consulted for admission.   **Interim History: She was taken for surgical intervention and postoperatively she was very somnolent and had a new oxygen requirement.  We transferred her to the PCU.  Subsequently she improved and has been weaned off of oxygen today.  PT OT  to further evaluate and treat and currently continues to have some pain.  Assessment and Plan:  Mechanical fall and  Left femur fracture:  Elderly patient presented with left leg pain after a mechanical fall at home. X-ray of left femur shows moderately angulated midshaft fracture of the left femur.  Orthopedic surgery consulted and she is s/p ORIF now. Admitted to MedSurg bed at [facility] but transferred to Progressive;  Buck traction placed in the ED.  Pain control oxycodone  and as needed IV Dilaudid . Fall precautions; Will need further care per Ortho and they are recommending weightbearing as tolerated and reinforce the dressings as needed.  They have initiated on enoxaparin  for VTE prophylaxis and they are recommending PT OT to evaluate her today.   Lower extremity cellulitis: Patient with chronic lower extremity edema found to have mild cellulitis of the lower extremities; Start IV Ancef  and being continued; CTM and Trend CBC, fever curve  Acute Respiratory Failure w/ Hypoxia: Post Op and ? Related to Anesthesia. SpO2: 94 % O2 Flow Rate (L/min): 2 L/min FiO2 (%): 21 % -Given IV Lasix  in the PACU. Check CXR and showed Stable cardiomediastinal silhouette. Right internal jugular Port-A-Cath is unchanged. Interval placement of left internal jugular catheter with distal tip in expected position of left brachiocephalic vein. No acute pulmonary disease is noted. Bony thorax is unremarkable. No pneumothorax is seen.   Type 2 Diabetes Mellitus complicated by Neuropathy:  Last A1c 6.5% 3 months ago;  Blood sugar elevated to 196 on BMP. Hold metformin  in the setting of pending surgery. Q4H SSI  with CBG monitoring while NPO. Follow-up repeat A1c. CBG Trend:  Recent Labs  Lab 05/28/24 0048 05/28/24 0420 05/28/24 0656 05/28/24 0814 05/28/24 1139 05/28/24 1453 05/28/24 1620  GLUCAP 367* 208* 161* 140* 304* 342* 315*  -C/w Gabapentin  600 mg po Daily and Gabapentin  300 mg po qHS   Diabetic Foot Ulcer /  Nonhealing pressure ulcer:  Patient with full-thickness ulcer at the posterior left heel with large eschar (see media tab);  No significant improvement in left heel ulcer over the last few weeks.  Followed by podiatry and wound care. Wound care consulted, appreciate recs; Angiogram as below   PAD: ABIs 02/16/2024 are 0.85 on the right biphasic and 1.01 on the left biphasic. Follows with vascular surgery with plan for aortogram with LLE angiogram due to worsening left foot ulcer and toe pressure of 0.  Vascular surgery consulted by Ortho in regards to possible intervention during this hospitalization and plan is for Angiogram early next week. -Continue Atorvastatin  as below   Hydronephrosis:  CT A/P 5 days ago showed bilateral hydronephrosis, R>L but no visible obstructing stone; Hydronephrosis thought to be due to patient's rectal cancer. Checked Renal U/S and showed  Mild right hydronephrosis which hasimproved since prior CT examination and Interval resolution of left Hydronephrosis. There was also Moderate right and mild left renal cortical atrophy. Will hold off Urological Consult given her improvement    Chronic Hypomagnesemia:  Unclear etiology, receives intermittent IV magnesium  by cancer center. Magnesium  low at 1.1 on admission.  Current magnesium  level trend: Recent Labs  Lab 05/02/24 1132 05/26/24 1746 05/27/24 0631 05/28/24 0300  MG 0.9* 1.1* 2.2 1.5*  - Replete with IV mag sulfate 2 g now.  CTM and Trend and repeat Mag Level in the AM   Essential HTN:  BP elevated with SBP in the 140s to 170s. C/w IV hydralazine  PRN for SBP > 180. CTM for S/Sx of Bleeding; CTM BP per Protocol. Last BP reading was 142/50   Cervical Cancer / Rectal cancer s/p colostomy: Followed closely with oncology, patient has declined further chemotherapy or surgery for removal of the remaining tumor.  Continue Vitamin Supplementation   Normocytic Anemia / IDA - Hgb stable at 8.8, baseline 8-9 -Hgb/Hct Trend:   Recent Labs  Lab 05/02/24 1132 05/21/24 1409 05/26/24 1730 05/27/24 0631 05/28/24 0300  HGB 9.4* 9.1* 8.8* 7.2* 10.0*  HCT 31.5* 31.3* 30.8* 25.1* 31.8*  MCV 83.3 85.8 85.6 84.8 84.8  -Checked Anemia Panel and showed an iron level of 11, UIBC 172, TIBC of 183, saturation ratios of 6%, ferritin level 114, folate level 9.3, and vitamin B12 374.  Continue iron supplementation w/ Ferrous Sulfate  325 mg po Daily -CTM for S/Sx of Bleeding; No overt bleeding noted and repeat CBC in the AM   Chronic BLE edema: Thought to be due to lymphedema;  Apply TED hose however her left leg is now wrapped   HLD: C/w Atorvastatin  40 mg po qHS   Hypothyroidism: C/w Levothyroxine  137 mcg po Daily   GERD/GI Prophylaxis: Continue home PPI w/ Pantoprazole  40 mg po Daily   Mood Disorder:  Continue Sertraline  150 mg po Daily  Hypoalbuminemia: Patient's Albumin Lvl is now 2.0. CTM and Trend and repeat CMP in the AM  Class I Obesity: Complicates overall prognosis and care. Estimated body mass index is 31.82 kg/m as calculated from the following:   Height as of this encounter: 5' (1.524 m).   Weight as of this encounter: 73.9 kg. Weight Loss and  Dietary Counseling given   DVT prophylaxis: Place TED hose Start: 05/26/24 2205 SCDs Start: 05/26/24 1943    Code Status: Full Code Family Communication: No family present @ bedside  Disposition Plan:  Level of care: Progressive Status is: Inpatient Remains inpatient appropriate because: Needs further clinical improvement and clearance by the specialists and evaluation by PT/OT   Consultants:  Orthopedic Surgery Vascular Surgery  Procedures:  Procedures: 27507-Open reduction internal fixation of left periprosthetic femur fracture  Antimicrobials:  Anti-infectives (From admission, onward)    Start     Dose/Rate Route Frequency Ordered Stop   05/27/24 1410  vancomycin  (VANCOCIN ) powder  Status:  Discontinued          As needed 05/27/24 1410 05/27/24  1528   05/26/24 2245  ceFAZolin  (ANCEF ) IVPB 1 g/50 mL premix        1 g 100 mL/hr over 30 Minutes Intravenous Every 8 hours 05/26/24 2150         Subjective: Seen and examined at bedside and was weaned off her oxygen.  States that she is still having some pain and discomfort.  States that she did not rest very well due to the pain.  Had a little bit of nausea earlier.  No other concerns or complaints at this time.  Objective: Vitals:   05/28/24 0745 05/28/24 0817 05/28/24 1141 05/28/24 1622  BP:  (!) 155/52 (!) 151/47 (!) 142/50  Pulse: 73 71 79 80  Resp:  18 19 19   Temp:  98.6 F (37 C) 98.7 F (37.1 C) 99.3 F (37.4 C)  TempSrc:  Oral Oral Oral  SpO2: 94% 93% 90% 94%  Weight:      Height:        Intake/Output Summary (Last 24 hours) at 05/28/2024 1939 Last data filed at 05/28/2024 1826 Gross per 24 hour  Intake 967 ml  Output 550 ml  Net 417 ml   Filed Weights   05/26/24 1440 05/27/24 1130 05/28/24 0353  Weight: 70 kg 70 kg 73.9 kg   Examination: Physical Exam:  Constitutional: WN/WD obese Caucasian female who is much more awake and alert and in no acute distress Respiratory: Diminished to auscultation bilaterally with some coarse breath sounds, no wheezing, rales, rhonchi or crackles. Normal respiratory effort and patient is not tachypenic. No accessory muscle use.  Unlabored breathing and has been weaned off of supplemental oxygen via nasal cannula Cardiovascular: RRR, no murmurs / rubs / gallops. S1 and S2 auscultated.  Left leg is wrapped Abdomen: Soft, non-tender, distended secondary to body habitus.  Has an ostomy noted.  Bowel sounds positive.  GU: Deferred. Musculoskeletal: No clubbing / cyanosis of digits/nails. No joint deformity upper and lower extremities. Skin: No rashes, lesions, ulcers on limited skin evaluation. No induration; Warm and dry.  Neurologic: CN 2-12 grossly intact with no focal deficits. Romberg sign and cerebellar reflexes not assessed.   Psychiatric: Normal judgment and insight. Alert and oriented x 3. Normal mood and appropriate affect.   Data Reviewed: I have personally reviewed following labs and imaging studies  CBC: Recent Labs  Lab 05/26/24 1730 05/27/24 0631 05/28/24 0300  WBC 10.4 5.9 9.0  NEUTROABS 8.9*  --  7.9*  HGB 8.8* 7.2* 10.0*  HCT 30.8* 25.1* 31.8*  MCV 85.6 84.8 84.8  PLT 154 121* 149*   Basic Metabolic Panel: Recent Labs  Lab 05/26/24 1730 05/26/24 1746 05/27/24 0631 05/28/24 0300  NA 139  --  142 139  K 4.2  --  3.7  3.8  CL 104  --  108 105  CO2 24  --  25 25  GLUCOSE 196*  --  121* 276*  BUN 20  --  13 16  CREATININE 1.12*  --  0.92 1.22*  CALCIUM  8.4*  --  8.0* 7.8*  MG  --  1.1* 2.2 1.5*  PHOS  --   --   --  2.9   GFR: Estimated Creatinine Clearance: 34.1 mL/min (A) (by C-G formula based on SCr of 1.22 mg/dL (H)). Liver Function Tests: Recent Labs  Lab 05/27/24 0631 05/28/24 0300  AST 14* 24  ALT 15 14  ALKPHOS 79 79  BILITOT 0.5 0.4  PROT 5.0* 4.7*  ALBUMIN 2.1* 2.0*   No results for input(s): LIPASE, AMYLASE in the last 168 hours. No results for input(s): AMMONIA in the last 168 hours. Coagulation Profile: Recent Labs  Lab 05/26/24 1730  INR 1.2   Cardiac Enzymes: No results for input(s): CKTOTAL, CKMB, CKMBINDEX, TROPONINI in the last 168 hours. BNP (last 3 results) No results for input(s): PROBNP in the last 8760 hours. HbA1C: Recent Labs    05/27/24 0631  HGBA1C 6.2*   CBG: Recent Labs  Lab 05/28/24 0656 05/28/24 0814 05/28/24 1139 05/28/24 1453 05/28/24 1620  GLUCAP 161* 140* 304* 342* 315*   Lipid Profile: No results for input(s): CHOL, HDL, LDLCALC, TRIG, CHOLHDL, LDLDIRECT in the last 72 hours. Thyroid  Function Tests: No results for input(s): TSH, T4TOTAL, FREET4, T3FREE, THYROIDAB in the last 72 hours. Anemia Panel: Recent Labs    05/28/24 0300  VITAMINB12 374  FOLATE 9.3  FERRITIN 114  TIBC  183*  IRON 11*  RETICCTPCT 1.1   Sepsis Labs: No results for input(s): PROCALCITON, LATICACIDVEN in the last 168 hours.  Recent Results (from the past 240 hours)  Surgical pcr screen     Status: None   Collection Time: 05/27/24 11:08 AM   Specimen: Nasal Mucosa; Nasal Swab  Result Value Ref Range Status   MRSA, PCR NEGATIVE NEGATIVE Final   Staphylococcus aureus NEGATIVE NEGATIVE Final    Comment: (NOTE) The Xpert SA Assay (FDA approved for NASAL specimens in patients 53 years of age and older), is one component of a comprehensive surveillance program. It is not intended to diagnose infection nor to guide or monitor treatment. Performed at [facility], [address], [place], [place] [address]     Radiology Studies: US  RENAL Result Date: 05/27/2024 CLINICAL DATA:  Hydronephrosis EXAM: RENAL / URINARY TRACT ULTRASOUND COMPLETE COMPARISON:  CT 05/21/2024 FINDINGS: Right Kidney: Renal measurements: 7.9 x 3.7 x 4.9 cm = volume: 75 mL. Moderate renal cortical atrophy. Renal cortical echogenicity is within normal limits. Mild hydronephrosis, improved since prior CT examination. No intrarenal masses or calcifications are seen. No perinephric fluid collections. Left Kidney: Renal measurements: 8.2 x 5.0 x 5.0 cm = volume: 107 mL. Limited visualization of the lower pole. Mild renal cortical atrophy. Renal cortical echogenicity is within normal limits. Previously noted hydronephrosis has resolved. No intrarenal masses or calcifications are seen. No perinephric fluid collections Bladder: Decompressed and not well visualized Other: None. IMPRESSION: 1. Mild right hydronephrosis, improved since prior CT examination. 2. Interval resolution of left hydronephrosis. 3. Moderate right and mild left renal cortical atrophy. Electronically Signed   By: Dorethia Molt M.D.   On: 05/27/2024 23:25   DG FEMUR PORT MIN 2 VIEWS LEFT Result Date: 05/27/2024 CLINICAL DATA:  Postoperative status. EXAM:  LEFT FEMUR PORTABLE 2 VIEWS COMPARISON:  Fluoroscopic images of same day. FINDINGS: Status post left total hip arthroplasty. Status post surgical internal fixation of midshaft fracture of left femur. Good alignment of fracture components is noted. IMPRESSION: Status post surgical internal fixation of left femoral midshaft fracture. Electronically Signed   By: Lynwood Landy Raddle M.D.   On: 05/27/2024 18:14   DG CHEST PORT 1 VIEW Result Date: 05/27/2024 CLINICAL DATA:  Central line placement. EXAM: PORTABLE CHEST 1 VIEW COMPARISON:  May 26, 2024. FINDINGS: Stable cardiomediastinal silhouette. Right internal jugular Port-A-Cath is unchanged. Interval placement of left internal jugular catheter with distal tip in expected position of left brachiocephalic vein. No acute pulmonary disease is noted. Bony thorax is unremarkable. No pneumothorax is seen. IMPRESSION: Interval placement of left internal jugular catheter with distal tip in expected position of left brachiocephalic vein. Electronically Signed   By: Lynwood Landy Raddle M.D.   On: 05/27/2024 18:13   DG FEMUR MIN 2 VIEWS LEFT Result Date: 05/27/2024 CLINICAL DATA:  Elective surgery. EXAM: LEFT FEMUR 2 VIEWS COMPARISON:  Preoperative imaging FINDINGS: Six fluoroscopic spot views of the femur are submitted from the operating room. Plate and screw fixation of femoral shaft fracture with additional cerclage wires. Previous proximal femoral hardware is in place. Fluoroscopy time 38.7 seconds. Dose 3.76 mGy. IMPRESSION: Intraoperative fluoroscopy during femoral fracture fixation. Electronically Signed   By: Andrea Gasman M.D.   On: 05/27/2024 15:29   DG C-Arm 1-60 Min-No Report Result Date: 05/27/2024 Fluoroscopy was utilized by the requesting physician.  No radiographic interpretation.   CT FEMUR LEFT WO CONTRAST Result Date: 05/26/2024 CLINICAL DATA:  Femur fracture EXAM: CT OF THE LOWER LEFT EXTREMITY WITHOUT CONTRAST TECHNIQUE: Multidetector CT imaging of  the lower left extremity was performed according to the standard protocol. RADIATION DOSE REDUCTION: This exam was performed according to the departmental dose-optimization program which includes automated exposure control, adjustment of the mA and/or kV according to patient size and/or use of iterative reconstruction technique. COMPARISON:  Plain films today FINDINGS: Bones/Joint/Cartilage Prior left hip replacement. There is a fracture through the midshaft of the left femur just below the hip prosthesis stem. Angulation and displacement. Ligaments Suboptimally assessed by CT. Muscles and Tendons Negative Soft tissues Edema throughout the subcutaneous soft tissues. IMPRESSION: Angulated and displaced midshaft left femoral fracture just below the left femoral prosthesis. Prior left hip replacement. Electronically Signed   By: Franky Crease M.D.   On: 05/26/2024 23:37   Scheduled Meds:  atorvastatin   40 mg Oral QHS   baclofen   10 mg Oral BID   Chlorhexidine  Gluconate Cloth  6 each Topical Daily   cholecalciferol   2,000 Units Oral Daily   clotrimazole    Topical Daily   cyanocobalamin   1,000 mcg Oral Daily   feeding supplement  237 mL Oral TID BM   ferrous sulfate   325 mg Oral Q M,W,F   gabapentin   300 mg Oral QHS   gabapentin   600 mg Oral Daily   insulin  aspart  0-15 Units Subcutaneous Q4H   leptospermum manuka honey  1 Application Topical Daily   levothyroxine   137 mcg Oral Q0600   multivitamin with minerals  1 tablet Oral Q breakfast   oxyCODONE   10 mg Oral Q12H   pantoprazole   40 mg Oral QAC breakfast   sertraline   150 mg Oral QHS   Continuous Infusions:   ceFAZolin  (ANCEF ) IV 1 g (05/28/24 1208)    LOS: 2 days   Alejandro Marker, DO [facility] Available via Epic secure chat  7am-7pm After these hours, please refer to coverage provider listed on [email or web address] 05/28/2024, 7:39 PM

## 2024-05-28 NOTE — Progress Notes (Signed)
     1 Day Post-Op Procedure(s) (LRB): OPEN REDUCTION INTERNAL FIXATION (ORIF) DISTAL FEMUR FRACTURE (Left) Subjective:  Patient reports pain as moderate to severe.  Asking for pain medicine.  Sleep somewhat difficult due to pain.  No overnight events.  Mobilize with PT today.  Denies chest pain, ShOB, N/V.  Denies numbness or tingling.  Has diabetic ulcer on foot to note, with dressing and covered by ace wrap at this time; being managed by wound care and podiatry.  No other complaints at this time.  Objective:   VITALS:   Vitals:   05/28/24 0043 05/28/24 0353 05/28/24 0745 05/28/24 0817  BP: (!) 150/54 (!) 119/57  (!) 155/52  Pulse: 72 64 73 71  Resp: 18 18  18   Temp: 98.1 F (36.7 C) 98.1 F (36.7 C)  98.6 F (37 C)  TempSrc: Oral Oral  Oral  SpO2: 94% 93% 94% 93%  Weight:  73.9 kg    Height:        Laying in bed in NAD Neurologically intact Sensation intact distally Decent perfusion of toes, warm and dry Dorsiflexion/Plantar flexion intact Incision: dressing C/D/I Compartment soft   Lab Results  Component Value Date   WBC 9.0 05/28/2024   HGB 10.0 (L) 05/28/2024   HCT 31.8 (L) 05/28/2024   MCV 84.8 05/28/2024   PLT 149 (L) 05/28/2024   BMET    Component Value Date/Time   NA 139 05/28/2024 0300   NA 144 08/21/2017 1553   K 3.8 05/28/2024 0300   CL 105 05/28/2024 0300   CO2 25 05/28/2024 0300   GLUCOSE 276 (H) 05/28/2024 0300   BUN 16 05/28/2024 0300   BUN 21 08/21/2017 1553   CREATININE 1.22 (H) 05/28/2024 0300   CREATININE 0.91 05/02/2024 1132   CALCIUM  7.8 (L) 05/28/2024 0300   CALCIUM  9.6 09/19/2010 2220   GFRNONAA 45 (L) 05/28/2024 0300   GFRNONAA >60 05/02/2024 1132     Xray: stable post-operative imaging  Assessment/Plan: 1 Day Post-Op   Principal Problem:   Femur fracture, left (HCC) Active Problems:   Fall at home, initial encounter   Cellulitis of both lower extremities   Procedure(s) (LRB): OPEN REDUCTION INTERNAL FIXATION  (ORIF) DISTAL FEMUR FRACTURE (Left)   Narrative: 05/28/24: mild likely post-operative anemia.  About to receive pain medicine.  Mobilize with PT.  Can change heel dressing today or tomorrow, per preference of podiatry/wound care.     Plan: Up with therapy Incentive Spirometry Elevate and Apply ice  Weightbearing: WBAT ROM/Precautions:  No formal precautions, increase activities as tolerated Mobility: Continue with PT/OT Insicional and dressing care: Reinforce dressings PRN Pain management: multimodal  Antibiotics: Periop abx Diet: Advance as tolerated VTE prophylaxis: Lovenox  Dispo: per medicine.  Pending PT eval, likely SNF vs CIR.  Follow - up plan: follow up in office in 1-2 weeks with Dr. Kendal Hila CHRISTELLA Mclean 05/28/2024, 9:00 AM   Contact information:   Tzzxijbd 7am-5pm epic message Dr. Edna, or call office for patient follow up: [phone] After hours and holidays please check [email or web address] for group call information for [facility]

## 2024-05-28 NOTE — Plan of Care (Signed)
 Pt accepting of condition and is compliant with prescribed regimen

## 2024-05-28 NOTE — Progress Notes (Signed)
 CVAD removed per protocol per MD order. Manual pressure applied for 5 mins. Vaseline gauze, gauze, and Tegaderm applied over insertion site. No bleeding or swelling noted. Instructed patient to remain in bed for thirty mins. Educated patient about S/S of infection and when to call MD; keep dressing dry and intact for 24 hours. Pt verbalized comprehension.

## 2024-05-28 NOTE — Progress Notes (Signed)
 [facility] night cross cover note:   I was notified by the patient's RN the patient has accidentally pulled out the line accessing her port.  I subsequently placed a consult with the IV team for assistance in re-accessing her port.     Eva Pore, DO Hospitalist

## 2024-05-29 ENCOUNTER — Inpatient Hospital Stay (HOSPITAL_COMMUNITY)

## 2024-05-29 DIAGNOSIS — L97429 Non-pressure chronic ulcer of left heel and midfoot with unspecified severity: Secondary | ICD-10-CM

## 2024-05-29 DIAGNOSIS — S7292XA Unspecified fracture of left femur, initial encounter for closed fracture: Secondary | ICD-10-CM | POA: Diagnosis not present

## 2024-05-29 DIAGNOSIS — J9601 Acute respiratory failure with hypoxia: Secondary | ICD-10-CM | POA: Diagnosis not present

## 2024-05-29 DIAGNOSIS — L03115 Cellulitis of right lower limb: Secondary | ICD-10-CM | POA: Diagnosis not present

## 2024-05-29 DIAGNOSIS — W19XXXA Unspecified fall, initial encounter: Secondary | ICD-10-CM | POA: Diagnosis not present

## 2024-05-29 LAB — COMPREHENSIVE METABOLIC PANEL WITH GFR
ALT: 10 U/L (ref 0–44)
AST: 22 U/L (ref 15–41)
Albumin: 2.1 g/dL — ABNORMAL LOW (ref 3.5–5.0)
Alkaline Phosphatase: 106 U/L (ref 38–126)
Anion gap: 13 (ref 5–15)
BUN: 19 mg/dL (ref 8–23)
CO2: 23 mmol/L (ref 22–32)
Calcium: 8.3 mg/dL — ABNORMAL LOW (ref 8.9–10.3)
Chloride: 103 mmol/L (ref 98–111)
Creatinine, Ser: 0.94 mg/dL (ref 0.44–1.00)
GFR, Estimated: >60 mL/min (ref >60)
Glucose, Bld: 247 mg/dL — ABNORMAL HIGH (ref 70–99)
Potassium: 4.6 mmol/L (ref 3.5–5.1)
Sodium: 139 mmol/L (ref 135–145)
Total Bilirubin: 0.6 mg/dL (ref 0.0–1.2)
Total Protein: 5.3 g/dL — ABNORMAL LOW (ref 6.5–8.1)

## 2024-05-29 LAB — CBC WITH DIFFERENTIAL/PLATELET
Abs Immature Granulocytes: 0.04 K/uL (ref 0.00–0.07)
Basophils Absolute: 0 K/uL (ref 0.0–0.1)
Basophils Relative: 0 %
Eosinophils Absolute: 0.1 K/uL (ref 0.0–0.5)
Eosinophils Relative: 1 %
HCT: 34.7 % — ABNORMAL LOW (ref 36.0–46.0)
Hemoglobin: 10.6 g/dL — ABNORMAL LOW (ref 12.0–15.0)
Immature Granulocytes: 1 %
Lymphocytes Relative: 9 %
Lymphs Abs: 0.7 K/uL (ref 0.7–4.0)
MCH: 26.6 pg (ref 26.0–34.0)
MCHC: 30.5 g/dL (ref 30.0–36.0)
MCV: 87.2 fL (ref 80.0–100.0)
Monocytes Absolute: 0.5 K/uL (ref 0.1–1.0)
Monocytes Relative: 6 %
Neutro Abs: 6.3 K/uL (ref 1.7–7.7)
Neutrophils Relative %: 83 %
Platelets: 155 K/uL (ref 150–400)
RBC: 3.98 MIL/uL (ref 3.87–5.11)
RDW: 16 % — ABNORMAL HIGH (ref 11.5–15.5)
WBC: 7.5 K/uL (ref 4.0–10.5)
nRBC: 0 % (ref 0.0–0.2)

## 2024-05-29 LAB — MAGNESIUM: Magnesium: 1.3 mg/dL — ABNORMAL LOW (ref 1.7–2.4)

## 2024-05-29 LAB — GLUCOSE, CAPILLARY
Glucose-Capillary: 101 mg/dL — ABNORMAL HIGH (ref 70–99)
Glucose-Capillary: 167 mg/dL — ABNORMAL HIGH (ref 70–99)
Glucose-Capillary: 174 mg/dL — ABNORMAL HIGH (ref 70–99)
Glucose-Capillary: 202 mg/dL — ABNORMAL HIGH (ref 70–99)
Glucose-Capillary: 220 mg/dL — ABNORMAL HIGH (ref 70–99)
Glucose-Capillary: 248 mg/dL — ABNORMAL HIGH (ref 70–99)
Glucose-Capillary: 255 mg/dL — ABNORMAL HIGH (ref 70–99)

## 2024-05-29 LAB — PHOSPHORUS: Phosphorus: 2.6 mg/dL (ref 2.5–4.6)

## 2024-05-29 MED ORDER — SODIUM CHLORIDE 0.9 % IV SOLN: INTRAVENOUS | Status: DC

## 2024-05-29 MED ORDER — LEVALBUTEROL HCL 0.63 MG/3ML IN NEBU
0.6300 mg | INHALATION_SOLUTION | Freq: Four times a day (QID) | RESPIRATORY_TRACT | Status: DC
  Filled 2024-05-29 (×2): qty 3

## 2024-05-29 MED ORDER — LEVALBUTEROL HCL 0.63 MG/3ML IN NEBU
0.6300 mg | INHALATION_SOLUTION | Freq: Four times a day (QID) | RESPIRATORY_TRACT | Status: DC | PRN
  Filled 2024-05-29: qty 3

## 2024-05-29 MED ORDER — GUAIFENESIN ER 600 MG PO TB12
1200.0000 mg | ORAL_TABLET | Freq: Two times a day (BID) | ORAL | Status: DC
  Administered 2024-05-29 – 2024-06-08 (×21): 1200 mg via ORAL
  Filled 2024-05-29 (×21): qty 2

## 2024-05-29 MED ORDER — IPRATROPIUM BROMIDE 0.02 % IN SOLN
0.5000 mg | Freq: Four times a day (QID) | RESPIRATORY_TRACT | Status: DC
  Filled 2024-05-29 (×2): qty 2.5

## 2024-05-29 MED ORDER — MAGNESIUM SULFATE 4 GM/100ML IV SOLN
4.0000 g | Freq: Once | INTRAVENOUS | Status: AC
  Administered 2024-05-29: 4 g via INTRAVENOUS
  Filled 2024-05-29: qty 100

## 2024-05-29 NOTE — Evaluation (Signed)
 Physical Therapy Evaluation Patient Details Name: [name] MRN: [id number] DOB: 28-Nov-1945 Today's Date: 05/29/2024  History of Present Illness  Pt is a 78 y.o female admitted 7/10 for fall in home. X-rays showed L periprosthetic femur fx. S/p ORIF 7/11. PMH:  HLD, HTN, cervical and rectal cancer s/p colostomy, PAD, diabetic foot ulcer, neuropathy, hypothyroidism, and arthritis, DM, CVA  Clinical Impression  Pt is presenting below baseline level of functioning. Prior to hospitalization pt was mod I with functional activities. Currently pt is Max A for bed mobility and squat pivot transfer. Pt unable to get due to standing due to pain/weakness and unable to progress gait. Due to pt current functional status, home set up and available assistance at home recommending skilled physical therapy services < 3 hours/day in order to address strength, balance and functional mobility to decrease risk for falls, injury, immobility, skin break down and re-hospitalization.          If plan is discharge home, recommend the following: Two people to help with walking and/or transfers;Assistance with cooking/housework;Assist for transportation;Help with stairs or ramp for entrance   Can travel by private vehicle   No    Equipment Recommendations Wheelchair (measurements PT);Wheelchair cushion (measurements PT);Hoyer lift;Hospital bed  Recommendations for Other Services       Functional Status Assessment Patient has had a recent decline in their functional status and demonstrates the ability to make significant improvements in function in a reasonable and predictable amount of time.     Precautions / Restrictions Precautions Precautions: Fall Recall of Precautions/Restrictions: Impaired Restrictions Weight Bearing Restrictions Per Provider Order: No Other Position/Activity Restrictions: WBAT on the LLE      Mobility  Bed Mobility Overal bed mobility: Needs Assistance Bed Mobility: Supine to  Sit     Supine to sit: Max assist, Used rails, HOB elevated     General bed mobility comments: Max A with LE and trunk to mid line with increased time and use of bed features with rest breaks due to pain with heavy cues for breathing through the nose due to hyperventilation through the mouth    Transfers Overall transfer level: Needs assistance Equipment used: None Transfers: Bed to chair/wheelchair/BSC       Squat pivot transfers: Max assist     General transfer comment: Max A for squat pivot transfer with significant difficulty pt trying to return back to bed halfway through transfer with LLE between therapist feet and R foot blocked by therapist foot to prevent sliding.    Ambulation/Gait     General Gait Details: Unable at this time.      Balance Overall balance assessment: Needs assistance Sitting-balance support: Bilateral upper extremity supported, Feet unsupported, Feet supported Sitting balance-Leahy Scale: Fair Sitting balance - Comments: no LOB, heavy UE support with R lean   Standing balance support: Bilateral upper extremity supported, During functional activity Standing balance-Leahy Scale: Zero Standing balance comment: Max A to maintain balance during transfer.         Pertinent Vitals/Pain Pain Assessment Pain Assessment: 0-10 Pain Score: 9  Pain Location: L hip Pain Descriptors / Indicators: Aching Pain Intervention(s): Limited activity within patient's tolerance, Monitored during session, Premedicated before session    Home Living Family/patient expects to be discharged to:: Private residence Living Arrangements: Spouse/significant other Available Help at Discharge: Family;Available PRN/intermittently (daughter) Type of Home: House Home Access: Ramped entrance       Home Layout: One level Home Equipment: Agricultural consultant (2 wheels);BSC/3in1;Rollator (4 wheels);Shower seat  Additional Comments: Spouse is not in good health. She provides  supervision assist for spouse. Pt has what sounds like HHPT states a girl comes to her house 1x week for 15-30 min and helps her sit down and stand up.    Prior Function Prior Level of Function : History of Falls (last six months);Independent/Modified Independent             Mobility Comments: She reported using a rollator for ambualtion. Pt states she has had atleast 5 falls in 6 months. ADLs Comments: Pt reported being modified independent to independent with ADLs, except for needing assistance for donning and doffing socks and shoes. She does not drive daughter assists with transportation.     Extremity/Trunk Assessment   Upper Extremity Assessment Upper Extremity Assessment: Generalized weakness;Defer to OT evaluation    Lower Extremity Assessment Lower Extremity Assessment: Generalized weakness;LLE deficits/detail LLE Deficits / Details: S/p ORIF of the L hip    Cervical / Trunk Assessment Cervical / Trunk Assessment: Normal  Communication   Communication Communication: No apparent difficulties    Cognition Arousal: Alert Behavior During Therapy: Anxious, WFL for tasks assessed/performed   PT - Cognitive impairments: No apparent impairments     Following commands: Intact       Cueing Cueing Techniques: Verbal cues, Gestural cues, Tactile cues     General Comments General comments (skin integrity, edema, etc.): dried blood on anterior L lateral cervical area.        Assessment/Plan    PT Assessment Patient needs continued PT services  PT Problem List Decreased strength;Decreased balance;Pain;Decreased mobility;Decreased safety awareness;Decreased activity tolerance       PT Treatment Interventions DME instruction;Therapeutic activities;Gait training;Therapeutic exercise;Patient/family education;Stair training;Functional mobility training;Balance training    PT Goals (Current goals can be found in the Care Plan section)  Acute Rehab PT Goals Patient  Stated Goal: improve mobility before returning home. PT Goal Formulation: With patient Time For Goal Achievement: 06/12/24 Potential to Achieve Goals: Fair    Frequency Min 4X/week        AM-PAC PT 6 Clicks Mobility  Outcome Measure Help needed turning from your back to your side while in a flat bed without using bedrails?: A Lot Help needed moving from lying on your back to sitting on the side of a flat bed without using bedrails?: A Lot Help needed moving to and from a bed to a chair (including a wheelchair)?: A Lot Help needed standing up from a chair using your arms (e.g., wheelchair or bedside chair)?: Total Help needed to walk in hospital room?: Total Help needed climbing 3-5 steps with a railing? : Total 6 Click Score: 9    End of Session Equipment Utilized During Treatment: Gait belt Activity Tolerance: Patient limited by pain;Other (comment) (limited by anxiety and fear of pain) Patient left: with call bell/phone within reach;in chair Nurse Communication: Mobility status;Need for lift equipment PT Visit Diagnosis: Unsteadiness on feet (R26.81);Other abnormalities of gait and mobility (R26.89);Muscle weakness (generalized) (M62.81);Pain Pain - Right/Left: Left Pain - part of body: Hip    Time: 0927-1015 PT Time Calculation (min) (ACUTE ONLY): 48 min   Charges:   PT Evaluation $PT Eval Low Complexity: 1 Low PT Treatments $Therapeutic Activity: 23-37 mins PT General Charges $$ ACUTE PT VISIT: 1 Visit        Dorothyann Maier, DPT, CLT  Acute Rehabilitation Services Office: [phone] (Secure chat preferred)   Dorothyann VEAR Maier 05/29/2024, 10:23 AM

## 2024-05-29 NOTE — Progress Notes (Signed)
  Progress Note    05/29/2024 8:33 AM 2 Days Post-Op  Subjective:  no complaints   Vitals:   05/28/24 1956 05/29/24 0720  BP: (!) 142/49   Pulse: 75 71  Resp: 17 18  Temp: 98.4 F (36.9 C) 98.9 F (37.2 C)  SpO2: 93%    Physical Exam: Lungs:  non labored Extremities:  dressing left in place L foot Neurologic: A&O  CBC    Component Value Date/Time   WBC 9.0 05/28/2024 0300   RBC 3.75 (L) 05/28/2024 0300   RBC 3.74 (L) 05/28/2024 0300   HGB 10.0 (L) 05/28/2024 0300   HGB 9.4 (L) 05/02/2024 1132   HCT 31.8 (L) 05/28/2024 0300   PLT 149 (L) 05/28/2024 0300   PLT 162 05/02/2024 1132   MCV 84.8 05/28/2024 0300   MCH 26.7 05/28/2024 0300   MCHC 31.4 05/28/2024 0300   RDW 15.5 05/28/2024 0300   LYMPHSABS 0.5 (L) 05/28/2024 0300   MONOABS 0.6 05/28/2024 0300   EOSABS 0.0 05/28/2024 0300   BASOSABS 0.0 05/28/2024 0300    BMET    Component Value Date/Time   NA 139 05/28/2024 0300   NA 144 08/21/2017 1553   K 3.8 05/28/2024 0300   CL 105 05/28/2024 0300   CO2 25 05/28/2024 0300   GLUCOSE 276 (H) 05/28/2024 0300   BUN 16 05/28/2024 0300   BUN 21 08/21/2017 1553   CREATININE 1.22 (H) 05/28/2024 0300   CREATININE 0.91 05/02/2024 1132   CALCIUM  7.8 (L) 05/28/2024 0300   CALCIUM  9.6 09/19/2010 2220   GFRNONAA 45 (L) 05/28/2024 0300   GFRNONAA >60 05/02/2024 1132   GFRAA >60 03/29/2020 0314    INR    Component Value Date/Time   INR 1.2 05/26/2024 1730     Intake/Output Summary (Last 24 hours) at 05/29/2024 9166 Last data filed at 05/29/2024 9278 Gross per 24 hour  Intake 847 ml  Output 1250 ml  Net -403 ml     Assessment/Plan:  78 y.o. female with left heel ulcer 2 Days Post-Op   Plan is for aortogram with left leg runoff tomorrow with Dr. Sheree due to necrotic heel wound Npo past midnight Consent ordered Check CBC in the morning; if H&H drifts further, procedure may be delayed  Donnice Sender, PA-C [facility] [phone] 05/29/2024 8:33 AM

## 2024-05-29 NOTE — Evaluation (Signed)
 Occupational Therapy Evaluation Patient Details Name: [name] MRN: [id number] DOB: 02-16-1946 Today's Date: 05/29/2024   History of Present Illness   Pt is a 78 y.o female admitted 7/10 for fall in home. X-rays showed L periprosthetic femur fx. S/p ORIF 7/11. PMH:  HLD, HTN, cervical and rectal cancer s/p colostomy, PAD, diabetic foot ulcer, neuropathy, hypothyroidism, and arthritis, DM, CVA     Clinical Impressions Pt admitted based on above, and was seen based on problem list below. PTA pt was independent with ADLs and receiving intermittent assist for LB dressing. Today pt is requiring set up  to total for  ADLs. Bed mobility and functional transfers are  max +2 . Per pt's family request, rehab consult has been placed for >3 hours of skilled rehab daily. OT will continue to follow acutely to maximize functional independence.        If plan is discharge home, recommend the following:   Two people to help with walking and/or transfers;Two people to help with bathing/dressing/bathroom;Assistance with cooking/housework     Functional Status Assessment   Patient has had a recent decline in their functional status and demonstrates the ability to make significant improvements in function in a reasonable and predictable amount of time.     Equipment Recommendations   Other (comment) (Defer to nect venue)      Precautions/Restrictions   Precautions Precautions: Fall Recall of Precautions/Restrictions: Impaired Restrictions Weight Bearing Restrictions Per Provider Order: Yes LLE Weight Bearing Per Provider Order: Weight bearing as tolerated     Mobility Bed Mobility Overal bed mobility: Needs Assistance Bed Mobility: Sit to Supine     Supine to sit: Max assist, +2 for safety/equipment     General bed mobility comments: Max assist for BLEs    Transfers Overall transfer level: Needs assistance Equipment used: Ambulation equipment used Transfers: Bed to  chair/wheelchair/BSC, Sit to/from Stand Sit to Stand: Max assist, +2 safety/equipment, +2 physical assistance   Squat pivot transfers: Max assist       General transfer comment: Max +2 for STS with stedy, difficulty using UE support to pull to stand Transfer via Lift Equipment: Stedy    Balance Overall balance assessment: Needs assistance Sitting-balance support: Bilateral upper extremity supported, Feet unsupported, Feet supported Sitting balance-Leahy Scale: Fair Sitting balance - Comments: heavy UE support   Standing balance support: Bilateral upper extremity supported, During functional activity Standing balance-Leahy Scale: Zero Standing balance comment: reliant on external support       ADL either performed or assessed with clinical judgement   ADL Overall ADL's : Needs assistance/impaired Eating/Feeding: Set up;Sitting   Grooming: Set up;Sitting   Upper Body Bathing: Set up;Sitting   Lower Body Bathing: Sit to/from stand;Maximal assistance   Upper Body Dressing : Set up;Sitting   Lower Body Dressing: Maximal assistance;Sit to/from stand   Toilet Transfer: Maximal assistance;+2 for safety/equipment;+2 for physical assistance Toilet Transfer Details (indicate cue type and reason): Simulated in room with stedy Toileting- Clothing Manipulation and Hygiene: Total assistance;Sitting/lateral lean;Sit to/from stand         General ADL Comments: Pt limited by pain in LLE     Vision Baseline Vision/History: 1 Wears glasses Vision Assessment?: No apparent visual deficits            Pertinent Vitals/Pain Pain Assessment Pain Assessment: Faces Faces Pain Scale: Hurts whole lot Pain Location: L hip Pain Descriptors / Indicators: Aching, Grimacing, Guarding, Moaning Pain Intervention(s): Limited activity within patient's tolerance     Extremity/Trunk  Assessment Upper Extremity Assessment Upper Extremity Assessment: Generalized weakness;Right hand dominant (RUE  weaker than LUE)   Lower Extremity Assessment Lower Extremity Assessment: Defer to PT evaluation   Cervical / Trunk Assessment Cervical / Trunk Assessment: Normal   Communication Communication Communication: No apparent difficulties   Cognition Arousal: Alert Behavior During Therapy: Anxious, WFL for tasks assessed/performed Cognition: No apparent impairments     Following commands: Intact       Cueing  General Comments   Cueing Techniques: Verbal cues;Gestural cues;Tactile cues  Pt often hyperventilating during mobility, pt not following cues for pursed lip breathing           Home Living Family/patient expects to be discharged to:: Private residence Living Arrangements: Spouse/significant other Available Help at Discharge: Family;Available PRN/intermittently Type of Home: House Home Access: Ramped entrance     Home Layout: One level     Bathroom Shower/Tub: Producer, television/film/video: Standard Bathroom Accessibility: Yes   Home Equipment: Agricultural consultant (2 wheels);BSC/3in1;Rollator (4 wheels);Shower seat   Additional Comments: Spouse is not in good health. She provides supervision assist for spouse. Pt has what sounds like HHPT states a girl comes to her house 1x week for 15-30 min and helps her sit down and stand up.      Prior Functioning/Environment Prior Level of Function : History of Falls (last six months);Independent/Modified Independent     Mobility Comments: She reported using a rollator for ambualtion. Pt states she has had atleast 5 falls in 6 months. ADLs Comments: Pt reports needing assist for socks/shoes, supervision for shower transfers, daughter manages medications    OT Problem List: Decreased strength;Decreased range of motion;Decreased activity tolerance;Impaired balance (sitting and/or standing);Decreased coordination;Decreased safety awareness;Decreased knowledge of use of DME or AE   OT Treatment/Interventions: Self-care/ADL  training;Therapeutic exercise;Energy conservation;DME and/or AE instruction;Therapeutic activities;Patient/family education;Balance training      OT Goals(Current goals can be found in the care plan section)   Acute Rehab OT Goals Patient Stated Goal: To get better OT Goal Formulation: With patient Time For Goal Achievement: 06/12/24 Potential to Achieve Goals: Good   OT Frequency:  Min 2X/week       AM-PAC OT 6 Clicks Daily Activity     Outcome Measure Help from another person eating meals?: None Help from another person taking care of personal grooming?: A Little Help from another person toileting, which includes using toliet, bedpan, or urinal?: Total Help from another person bathing (including washing, rinsing, drying)?: A Lot Help from another person to put on and taking off regular upper body clothing?: A Little Help from another person to put on and taking off regular lower body clothing?: A Lot 6 Click Score: 15   End of Session Equipment Utilized During Treatment: Gait belt;Other (comment) Laurent) Nurse Communication: Mobility status;Need for lift equipment  Activity Tolerance: Patient limited by pain Patient left: in bed;with call bell/phone within reach;with bed alarm set;with family/visitor present  OT Visit Diagnosis: Other abnormalities of gait and mobility (R26.89);Unsteadiness on feet (R26.81);Repeated falls (R29.6);Muscle weakness (generalized) (M62.81)                Time: 1410-1433 OT Time Calculation (min): 23 min Charges:  OT General Charges $OT Visit: 1 Visit OT Evaluation $OT Eval Moderate Complexity: 1 Mod  Isom Kochan C, OT  Acute Rehabilitation Services Office [phone] Secure chat preferred   [name] 05/29/2024, 3:00 PM

## 2024-05-29 NOTE — Progress Notes (Signed)
     2 Days Post-Op Procedure(s) (LRB): OPEN REDUCTION INTERNAL FIXATION (ORIF) DISTAL FEMUR FRACTURE (Left) Subjective:  Patient reports pain is so far well controlled while in bed, has not gotten up out of bed yet. Denies chest pain, ShOB, N/V.  Denies numbness or tingling. No other complaints at this time.  Objective:   VITALS:   Vitals:   05/28/24 1141 05/28/24 1622 05/28/24 1956 05/29/24 0720  BP: (!) 151/47 (!) 142/50 (!) 142/49   Pulse: 79 80 75 71  Resp: 19 19 17 18   Temp: 98.7 F (37.1 C) 99.3 F (37.4 C) 98.4 F (36.9 C) 98.9 F (37.2 C)  TempSrc: Oral Oral Oral Oral  SpO2: 90% 94% 93%   Weight:      Height:        Laying in bed in NAD Neurologically intact Sensation intact distally Decent perfusion of toes, warm and dry Dorsiflexion/Plantar flexion intact Incision: dressing C/D/I Compartment soft   Lab Results  Component Value Date   WBC 9.0 05/28/2024   HGB 10.0 (L) 05/28/2024   HCT 31.8 (L) 05/28/2024   MCV 84.8 05/28/2024   PLT 149 (L) 05/28/2024   BMET    Component Value Date/Time   NA 139 05/28/2024 0300   NA 144 08/21/2017 1553   K 3.8 05/28/2024 0300   CL 105 05/28/2024 0300   CO2 25 05/28/2024 0300   GLUCOSE 276 (H) 05/28/2024 0300   BUN 16 05/28/2024 0300   BUN 21 08/21/2017 1553   CREATININE 1.22 (H) 05/28/2024 0300   CREATININE 0.91 05/02/2024 1132   CALCIUM  7.8 (L) 05/28/2024 0300   CALCIUM  9.6 09/19/2010 2220   GFRNONAA 45 (L) 05/28/2024 0300   GFRNONAA >60 05/02/2024 1132     Xray: stable post-operative imaging  Assessment/Plan: 2 Days Post-Op   Principal Problem:   Femur fracture, left (HCC) Active Problems:   Fall at home, initial encounter   Cellulitis of both lower extremities   Procedure(s) (LRB): OPEN REDUCTION INTERNAL FIXATION (ORIF) DISTAL FEMUR FRACTURE (Left)  Plan: Up with therapy Incentive Spirometry Elevate and Apply ice Labs not back yet this morning, will continue to watch  Weightbearing:  WBAT ROM/Precautions:  No formal precautions, increase activities as tolerated Mobility: Continue with PT/OT Insicional and dressing care: Reinforce dressings PRN Pain management: multimodal  Antibiotics: Periop abx Diet: Advance as tolerated VTE prophylaxis: Lovenox  Dispo: per medicine.  Pending PT eval, likely SNF vs CIR.  Follow - up plan: follow up in office in 1-2 weeks with Dr. Kendal Berth MARLA Daring 05/29/2024, 8:11 AM   Contact information:   [id number] 7am-5pm epic message Dr. Edna, or call office for patient follow up: [phone] After hours and holidays please check [email or web address] for group call information for [facility]

## 2024-05-29 NOTE — Progress Notes (Signed)
 PROGRESS NOTE    [name]  FMW:[id number] DOB: Feb 08, 1946 DOA: 05/26/2024 PCP: Waylan Almarie SAUNDERS, MD   Brief Narrative:  [name] is a 78 y.o. female with medical history significant for T2DM, HLD, HTN, cervical and rectal cancer s/p colostomy, PAD, diabetic foot ulcer,, neuropathy, hypothyroidism, hypomagnesemia, anemia and arthritis who presented to the ED after a fall at home.  Per son, after returning from PCP office, patient had 3 episodes where she lost her balance while ambulating he was able to catch her each time.  Daughter reports that patient was trying to straighten her bed, lost her balance and fell face down on the bed. Patient's left leg was bent and twisted behind her and as daughter attempted to straighten it, she heard a pop. Patient reported significant pain in the leg so she called EMS. At baseline, patient ambulates with a walker however recently, she has been losing strength in her legs. Patient has chronic lower extremity swelling and neuropathy but has had recent increased redness to her legs, L>R.  She she has had poor appetite but denies any dizziness, recent illness, fevers, chills, nausea or vomiting. She was recently treated for possible UTI about a week ago.   ED Course: Initial vitals show patient hypertensive with SBP in the 140s to 190s, otherwise normal vitals. Initial labs significant for glucose 196, creatinine 1.12, WBC 10.4, Hgb 8.8, normal INR. EKG shows normal sinus rhythm. X-ray of the left femur shows moderately angulated midshaft fracture of the left femur.  Pt received fentanyl  100 mcg x 1. Orthopedic surgery was consulted for evaluation. A Buck's traction was placed.  TRH was consulted for admission.   **Interim History: She was taken for surgical intervention and postoperatively she was very somnolent and had a new oxygen requirement.  We transferred her to the PCU.  She is back on Supplemental O2 via [place]. PT/OT to further evaluate and treat  and recommending SNF/CIR and currently continues to have some pain. Plan is for aortogram w/ Left Leg Run-off on 05/30/24  Assessment and Plan:  Mechanical fall and  Left femur fracture:  Elderly patient presented with left leg pain after a mechanical fall at home. X-ray of left femur shows moderately angulated midshaft fracture of the left femur.  Orthopedic surgery consulted and she is s/p ORIF now. Admitted [facility] at [facility] but transferred to Progressive;  Buck traction placed in the ED.  Pain control w/ Oxycodone  and as needed IV Hydromorphine. Fall precautions; Will need further care per Ortho and they are recommending weightbearing as tolerated and reinforce the dressings as needed.  They have initiated on enoxaparin  for VTE prophylaxis and they are recommending PT/OT recommending SNF vs CIR   Lower Extremity Cellulitis: Patient with chronic lower extremity edema found to have mild cellulitis of the lower extremities; C/w IV Cefazolin  1 gram po q8h; CTM and Trend CBC, fever curve  Acute Respiratory Failure w/ Hypoxia: Post Op and ? Related to Anesthesia however was hypoxic overnight and had to be placed back on Supplemental O2 via [place]. SpO2: 91 %; O2 Flow Rate (L/min): 2 L/min, FiO2 (%): 21 % -Given IV Lasix  in the PACU. Initial CXR in the PACU showed Stable cardiomediastinal silhouette. Right internal jugular Port-A-Cath is unchanged. Interval placement of left internal jugular catheter with distal tip in expected position of left brachiocephalic vein. No acute pulmonary disease is noted. Bony thorax is unremarkable. No pneumothorax is seen. -CXR this afternoon pending. Add Flutter Valve, Incentive  Spirometry, and Guaifenesin  1200 mg po BID -Added Xopenex /Atrovent  q6h scheduled    Type 2 Diabetes Mellitus complicated by Neuropathy:  Last A1c 6.5% 3 months ago;  Blood sugar elevated to 196 on BMP. Hold metformin  in the setting of pending surgery. Q4H SSI with CBG monitoring while NPO.  Follow-up repeat A1c. CBG Trend:  Recent Labs  Lab 05/28/24 1620 05/28/24 1955 05/29/24 0023 05/29/24 0527 05/29/24 0719 05/29/24 1127 05/29/24 1536  GLUCAP 315* 219* 101* 167* 174* 248* 220*  -C/w Gabapentin  600 mg po Daily and Gabapentin  300 mg po qHS   Diabetic Foot Ulcer / Nonhealing pressure ulcer:  Patient with full-thickness ulcer at the posterior left heel with large eschar (see media tab);  No significant improvement in left heel ulcer over the last few weeks.  Followed by podiatry and wound care. Wound care consulted, appreciate recs; Plain is for Aortogram w/ Left Leg Runoff as below to be don on 05/30/24   PAD: ABIs 02/16/2024 are 0.85 on the right biphasic and 1.01 on the left biphasic. Follows with vascular surgery with plan for aortogram with LLE angiogram due to worsening left foot ulcer and toe pressure of 0.  Vascular surgery consulted by Ortho in regards to possible intervention during this hospitalization and plan is for Angiogram early next week. -Continue Atorvastatin  as below   Hydronephrosis:  CT A/P 5 days ago showed bilateral hydronephrosis, R>L but no visible obstructing stone; Hydronephrosis thought to be due to patient's rectal cancer. Checked Renal U/S and showed  Mild right hydronephrosis which hasimproved since prior CT examination and Interval resolution of left Hydronephrosis. There was also Moderate right and mild left renal cortical atrophy. Will hold off Urological Consult given her improvement    Chronic Hypomagnesemia:  Unclear etiology, receives intermittent IV magnesium  by cancer center. Magnesium  low at 1.1 on admission.  Current magnesium  level trend: Recent Labs  Lab 05/02/24 1132 05/26/24 1746 05/27/24 0631 05/28/24 0300 05/29/24 1146  MG 0.9* 1.1* 2.2 1.5* 1.3*  - Replete with IV mag sulfate 4 g now.  CTM and Trend and repeat Mag Level in the AM   Essential HTN:  BP elevated with SBP in the 140s to 170s. C/w IV hydralazine  PRN for SBP > 180.  CTM for S/Sx of Bleeding; CTM BP per Protocol. Last BP reading was 127/50   Cervical Cancer / Rectal cancer s/p colostomy: Followed closely with Oncology, patient has declined further chemotherapy or surgery for removal of the remaining tumor.  Continue Vitamin Supplementation   Normocytic Anemia / IDA - Hgb stable at 8.8, baseline 8-9 -Hgb/Hct Trend:  Recent Labs  Lab 05/02/24 1132 05/21/24 1409 05/26/24 1730 05/27/24 0631 05/28/24 0300 05/29/24 1146  HGB 9.4* 9.1* 8.8* 7.2* 10.0* 10.6*  HCT 31.5* 31.3* 30.8* 25.1* 31.8* 34.7*  MCV 83.3 85.8 85.6 84.8 84.8 87.2  -Checked Anemia Panel and showed an iron level of 11, UIBC 172, TIBC of 183, saturation ratios of 6%, ferritin level 114, folate level 9.3, and vitamin B12 374.  Continue iron supplementation w/ Ferrous Sulfate  325 mg po MWF -CTM for S/Sx of Bleeding; No overt bleeding noted and repeat CBC in the AM   Chronic BLE edema: Thought to be due to lymphedema;  Apply TED hose however her left leg is now wrapped   HLD: C/w Atorvastatin  40 mg po qHS   Hypothyroidism: C/w Levothyroxine  137 mcg po Daily   GERD/GI Prophylaxis: Continue home PPI w/ Pantoprazole  40 mg po Daily   Mood Disorder:  Continue Sertraline  150 mg po Daily  Hypoalbuminemia: Patient's Albumin Lvl is now 2.1. CTM and Trend and repeat CMP in the AM  Class I Obesity: Complicates overall prognosis and care. Estimated body mass index is 31.82 kg/m as calculated from the following:   Height as of this encounter: 5' (1.524 m).   Weight as of this encounter: 73.9 kg. Weight Loss and Dietary Counseling given   DVT prophylaxis: Place TED hose Start: 05/26/24 2205 SCDs Start: 05/26/24 1943    Code Status: Full Code Family Communication: No family present @ bedside   Disposition Plan:  Level of care: Progressive Status is: Inpatient Remains inpatient appropriate because: Needs further clinical improvement and clearance by the Specialists. She is getting an  Aortogram with Left Leg Run-off  Consultants:  Orthopedic Surgery Vascular Surgery  Procedures:  Procedures: 27507-Open reduction internal fixation of left periprosthetic femur fracture  Antimicrobials:  Anti-infectives (From admission, onward)    Start     Dose/Rate Route Frequency Ordered Stop   05/27/24 1410  vancomycin  (VANCOCIN ) powder  Status:  Discontinued          As needed 05/27/24 1410 05/27/24 1528   05/26/24 2245  ceFAZolin  (ANCEF ) IVPB 1 g/50 mL premix        1 g 100 mL/hr over 30 Minutes Intravenous Every 8 hours 05/26/24 2150         Subjective: And examined at bedside and was back on oxygen.  Apparently she desaturated during the evening and desaturates when she sleeps.  Still having some pain and discomfort.  No nausea or vomiting.  Denies any shortness of breath or chest pain.  PT recommending SNF but OT recommending CIR now.  Plan is for aortogram with left lower extremity runoff.  No other concerns or complaints at this time.  Objective: Vitals:   05/28/24 1956 05/29/24 0720 05/29/24 1129 05/29/24 1538  BP: (!) 142/49  (!) 176/60 (!) 127/50  Pulse: 75 71 78 71  Resp: 17 18 18 18   Temp: 98.4 F (36.9 C) 98.9 F (37.2 C) 98.7 F (37.1 C) 98.6 F (37 C)  TempSrc: Oral Oral Oral Oral  SpO2: 93%  90% 91%  Weight:      Height:        Intake/Output Summary (Last 24 hours) at 05/29/2024 1615 Last data filed at 05/29/2024 1540 Gross per 24 hour  Intake 240 ml  Output 1650 ml  Net -1410 ml   Filed Weights   05/26/24 1440 05/27/24 1130 05/28/24 0353  Weight: 70 kg 70 kg 73.9 kg   Examination: Physical Exam:  Constitutional: WN/WD obese Caucasian female who is awake and alert and complaining that she is on supplemental oxygen was to be weaned off. Respiratory: Diminished to auscultation bilaterally with some coarse breath sounds, no wheezing, rales, rhonchi or crackles. Normal respiratory effort and patient is not tachypenic. No accessory muscle use.   Unlabored breathing and is now back on supplemental oxygen Cardiovascular: RRR, no murmurs / rubs / gallops. S1 and S2 auscultated.  Left leg is wrapped and has some right leg edema noted. Abdomen: Soft, non-tender, distended secondary body habitus and has an ostomy noted. Bowel sounds positive.  GU: Deferred. Musculoskeletal: No clubbing / cyanosis of digits/nails. No joint deformity upper and lower extremities.  Skin: No rashes, lesions, ulcers on a limited skin evaluation. No induration; Warm and dry.  Neurologic: CN 2-12 grossly intact with no focal deficits. Romberg sign and cerebellar reflexes not assessed.  Psychiatric: Normal judgment  and insight. Alert and oriented x 3.  Data Reviewed: I have personally reviewed following labs and imaging studies  CBC: Recent Labs  Lab 05/26/24 1730 05/27/24 0631 05/28/24 0300 05/29/24 1146  WBC 10.4 5.9 9.0 7.5  NEUTROABS 8.9*  --  7.9* 6.3  HGB 8.8* 7.2* 10.0* 10.6*  HCT 30.8* 25.1* 31.8* 34.7*  MCV 85.6 84.8 84.8 87.2  PLT 154 121* 149* 155   Basic Metabolic Panel: Recent Labs  Lab 05/26/24 1730 05/26/24 1746 05/27/24 0631 05/28/24 0300 05/29/24 1146  NA 139  --  142 139 139  K 4.2  --  3.7 3.8 4.6  CL 104  --  108 105 103  CO2 24  --  25 25 23   GLUCOSE 196*  --  121* 276* 247*  BUN 20  --  13 16 19   CREATININE 1.12*  --  0.92 1.22* 0.94  CALCIUM  8.4*  --  8.0* 7.8* 8.3*  MG  --  1.1* 2.2 1.5* 1.3*  PHOS  --   --   --  2.9 2.6   GFR: Estimated Creatinine Clearance: 44.3 mL/min (by C-G formula based on SCr of 0.94 mg/dL). Liver Function Tests: Recent Labs  Lab 05/27/24 0631 05/28/24 0300 05/29/24 1146  AST 14* 24 22  ALT 15 14 10   ALKPHOS 79 79 106  BILITOT 0.5 0.4 0.6  PROT 5.0* 4.7* 5.3*  ALBUMIN 2.1* 2.0* 2.1*   No results for input(s): LIPASE, AMYLASE in the last 168 hours. No results for input(s): AMMONIA in the last 168 hours. Coagulation Profile: Recent Labs  Lab 05/26/24 1730  INR 1.2    Cardiac Enzymes: No results for input(s): CKTOTAL, CKMB, CKMBINDEX, TROPONINI in the last 168 hours. BNP (last 3 results) No results for input(s): PROBNP in the last 8760 hours. HbA1C: Recent Labs    05/27/24 0631  HGBA1C 6.2*   CBG: Recent Labs  Lab 05/29/24 0023 05/29/24 0527 05/29/24 0719 05/29/24 1127 05/29/24 1536  GLUCAP 101* 167* 174* 248* 220*   Lipid Profile: No results for input(s): CHOL, HDL, LDLCALC, TRIG, CHOLHDL, LDLDIRECT in the last 72 hours. Thyroid  Function Tests: No results for input(s): TSH, T4TOTAL, FREET4, T3FREE, THYROIDAB in the last 72 hours. Anemia Panel: Recent Labs    05/28/24 0300  VITAMINB12 374  FOLATE 9.3  FERRITIN 114  TIBC 183*  IRON 11*  RETICCTPCT 1.1   Sepsis Labs: No results for input(s): PROCALCITON, LATICACIDVEN in the last 168 hours.  Recent Results (from the past 240 hours)  Surgical pcr screen     Status: None   Collection Time: 05/27/24 11:08 AM   Specimen: Nasal Mucosa; Nasal Swab  Result Value Ref Range Status   MRSA, PCR NEGATIVE NEGATIVE Final   Staphylococcus aureus NEGATIVE NEGATIVE Final    Comment: (NOTE) The Xpert SA Assay (FDA approved for NASAL specimens in patients 63 years of age and older), is one component of a comprehensive surveillance program. It is not intended to diagnose infection nor to guide or monitor treatment. Performed at [facility], [address], [place], [place] [address]     Radiology Studies: US  RENAL Result Date: 05/27/2024 CLINICAL DATA:  Hydronephrosis EXAM: RENAL / URINARY TRACT ULTRASOUND COMPLETE COMPARISON:  CT 05/21/2024 FINDINGS: Right Kidney: Renal measurements: 7.9 x 3.7 x 4.9 cm = volume: 75 mL. Moderate renal cortical atrophy. Renal cortical echogenicity is within normal limits. Mild hydronephrosis, improved since prior CT examination. No intrarenal masses or calcifications are seen. No perinephric fluid  collections. Left  Kidney: Renal measurements: 8.2 x 5.0 x 5.0 cm = volume: 107 mL. Limited visualization of the lower pole. Mild renal cortical atrophy. Renal cortical echogenicity is within normal limits. Previously noted hydronephrosis has resolved. No intrarenal masses or calcifications are seen. No perinephric fluid collections Bladder: Decompressed and not well visualized Other: None. IMPRESSION: 1. Mild right hydronephrosis, improved since prior CT examination. 2. Interval resolution of left hydronephrosis. 3. Moderate right and mild left renal cortical atrophy. Electronically Signed   By: Dorethia Molt M.D.   On: 05/27/2024 23:25     Scheduled Meds:  atorvastatin   40 mg Oral QHS   baclofen   10 mg Oral BID   Chlorhexidine  Gluconate Cloth  6 each Topical Daily   cholecalciferol   2,000 Units Oral Daily   clotrimazole    Topical Daily   cyanocobalamin   1,000 mcg Oral Daily   feeding supplement  237 mL Oral TID BM   ferrous sulfate   325 mg Oral Q M,W,F   gabapentin   300 mg Oral QHS   gabapentin   600 mg Oral Daily   guaiFENesin   1,200 mg Oral BID   insulin  aspart  0-15 Units Subcutaneous Q4H   ipratropium  0.5 mg Nebulization Q6H   leptospermum manuka honey  1 Application Topical Daily   levalbuterol   0.63 mg Nebulization Q6H   levothyroxine   137 mcg Oral Q0600   multivitamin with minerals  1 tablet Oral Q breakfast   oxyCODONE   10 mg Oral Q12H   pantoprazole   40 mg Oral QAC breakfast   sertraline   150 mg Oral QHS   Continuous Infusions:   ceFAZolin  (ANCEF ) IV 1 g (05/29/24 1455)   magnesium  sulfate bolus IVPB      LOS: 3 days   Alejandro Marker, DO [facility] Available via Epic secure chat 7am-7pm After these hours, please refer to coverage provider listed on [email or web address] 05/29/2024, 4:15 PM

## 2024-05-30 ENCOUNTER — Inpatient Hospital Stay (HOSPITAL_COMMUNITY)

## 2024-05-30 ENCOUNTER — Encounter (HOSPITAL_COMMUNITY)
Admission: EM | Disposition: A | Discharge status: Skilled Nursing Facility | Payer: Self-pay | Source: Home / Self Care | Attending: Family Medicine

## 2024-05-30 ENCOUNTER — Encounter (HOSPITAL_COMMUNITY): Payer: Self-pay | Admitting: Student

## 2024-05-30 ENCOUNTER — Ambulatory Visit (HOSPITAL_COMMUNITY): Admission: RE | Admit: 2024-05-30 | Source: Home / Self Care | Admitting: Vascular Surgery

## 2024-05-30 DIAGNOSIS — W19XXXA Unspecified fall, initial encounter: Secondary | ICD-10-CM | POA: Diagnosis not present

## 2024-05-30 DIAGNOSIS — I70201 Unspecified atherosclerosis of native arteries of extremities, right leg: Secondary | ICD-10-CM

## 2024-05-30 DIAGNOSIS — J9601 Acute respiratory failure with hypoxia: Secondary | ICD-10-CM | POA: Diagnosis not present

## 2024-05-30 DIAGNOSIS — I771 Stricture of artery: Secondary | ICD-10-CM

## 2024-05-30 DIAGNOSIS — S7292XA Unspecified fracture of left femur, initial encounter for closed fracture: Secondary | ICD-10-CM | POA: Diagnosis not present

## 2024-05-30 DIAGNOSIS — S72332A Displaced oblique fracture of shaft of left femur, initial encounter for closed fracture: Principal | ICD-10-CM

## 2024-05-30 DIAGNOSIS — L03115 Cellulitis of right lower limb: Secondary | ICD-10-CM | POA: Diagnosis not present

## 2024-05-30 DIAGNOSIS — I70244 Atherosclerosis of native arteries of left leg with ulceration of heel and midfoot: Secondary | ICD-10-CM

## 2024-05-30 HISTORY — PX: ABDOMINAL AORTOGRAM: CATH118222

## 2024-05-30 HISTORY — PX: LOWER EXTREMITY INTERVENTION: CATH118252

## 2024-05-30 HISTORY — PX: LOWER EXTREMITY ANGIOGRAPHY: CATH118251

## 2024-05-30 LAB — GLUCOSE, CAPILLARY
Glucose-Capillary: 127 mg/dL — ABNORMAL HIGH (ref 70–99)
Glucose-Capillary: 133 mg/dL — ABNORMAL HIGH (ref 70–99)
Glucose-Capillary: 143 mg/dL — ABNORMAL HIGH (ref 70–99)
Glucose-Capillary: 176 mg/dL — ABNORMAL HIGH (ref 70–99)
Glucose-Capillary: 262 mg/dL — ABNORMAL HIGH (ref 70–99)
Glucose-Capillary: 275 mg/dL — ABNORMAL HIGH (ref 70–99)

## 2024-05-30 LAB — COMPREHENSIVE METABOLIC PANEL WITH GFR
ALT: 10 U/L (ref 0–44)
AST: 21 U/L (ref 15–41)
Albumin: 1.9 g/dL — ABNORMAL LOW (ref 3.5–5.0)
Alkaline Phosphatase: 103 U/L (ref 38–126)
Anion gap: 8 (ref 5–15)
BUN: 20 mg/dL (ref 8–23)
CO2: 27 mmol/L (ref 22–32)
Calcium: 8.3 mg/dL — ABNORMAL LOW (ref 8.9–10.3)
Chloride: 104 mmol/L (ref 98–111)
Creatinine, Ser: 0.85 mg/dL (ref 0.44–1.00)
GFR, Estimated: >60 mL/min (ref >60)
Glucose, Bld: 147 mg/dL — ABNORMAL HIGH (ref 70–99)
Potassium: 4.3 mmol/L (ref 3.5–5.1)
Sodium: 139 mmol/L (ref 135–145)
Total Bilirubin: 0.6 mg/dL (ref 0.0–1.2)
Total Protein: 4.9 g/dL — ABNORMAL LOW (ref 6.5–8.1)

## 2024-05-30 LAB — CBC WITH DIFFERENTIAL/PLATELET
Abs Immature Granulocytes: 0.04 K/uL (ref 0.00–0.07)
Basophils Absolute: 0 K/uL (ref 0.0–0.1)
Basophils Relative: 0 %
Eosinophils Absolute: 0.1 K/uL (ref 0.0–0.5)
Eosinophils Relative: 1 %
HCT: 32 % — ABNORMAL LOW (ref 36.0–46.0)
Hemoglobin: 9.7 g/dL — ABNORMAL LOW (ref 12.0–15.0)
Immature Granulocytes: 1 %
Lymphocytes Relative: 14 %
Lymphs Abs: 1 K/uL (ref 0.7–4.0)
MCH: 26.2 pg (ref 26.0–34.0)
MCHC: 30.3 g/dL (ref 30.0–36.0)
MCV: 86.5 fL (ref 80.0–100.0)
Monocytes Absolute: 0.4 K/uL (ref 0.1–1.0)
Monocytes Relative: 6 %
Neutro Abs: 5.6 K/uL (ref 1.7–7.7)
Neutrophils Relative %: 78 %
Platelets: 155 K/uL (ref 150–400)
RBC: 3.7 MIL/uL — ABNORMAL LOW (ref 3.87–5.11)
RDW: 16.1 % — ABNORMAL HIGH (ref 11.5–15.5)
WBC: 7.2 K/uL (ref 4.0–10.5)
nRBC: 0 % (ref 0.0–0.2)

## 2024-05-30 LAB — POCT ACTIVATED CLOTTING TIME
Activated Clotting Time: 228 s
Activated Clotting Time: 193 s
Activated Clotting Time: 176 s

## 2024-05-30 LAB — PHOSPHORUS: Phosphorus: 3.2 mg/dL (ref 2.5–4.6)

## 2024-05-30 LAB — MAGNESIUM: Magnesium: 1.8 mg/dL (ref 1.7–2.4)

## 2024-05-30 MED ORDER — HYDRALAZINE HCL 20 MG/ML IJ SOLN
INTRAMUSCULAR | Status: AC
  Filled 2024-05-30: qty 1

## 2024-05-30 MED ORDER — SODIUM CHLORIDE 0.9 % WEIGHT BASED INFUSION: 1.0000 mL/kg/h | INTRAVENOUS | Status: AC

## 2024-05-30 MED ORDER — FENTANYL CITRATE (PF) 100 MCG/2ML IJ SOLN
INTRAMUSCULAR | Status: AC
  Filled 2024-05-30: qty 2

## 2024-05-30 MED ORDER — CLOPIDOGREL BISULFATE 75 MG PO TABS
75.0000 mg | ORAL_TABLET | Freq: Every day | ORAL | Status: DC
  Administered 2024-05-31 – 2024-06-08 (×9): 75 mg via ORAL
  Filled 2024-05-30 (×9): qty 1

## 2024-05-30 MED ORDER — SODIUM CHLORIDE 0.9% FLUSH
3.0000 mL | Freq: Two times a day (BID) | INTRAVENOUS | Status: DC
  Administered 2024-05-30 – 2024-06-08 (×17): 3 mL via INTRAVENOUS

## 2024-05-30 MED ORDER — CLOPIDOGREL BISULFATE 300 MG PO TABS
ORAL_TABLET | ORAL | Status: DC | PRN
  Administered 2024-05-30: 300 mg via ORAL

## 2024-05-30 MED ORDER — ATROPINE SULFATE 1 MG/10ML IJ SOSY
PREFILLED_SYRINGE | INTRAMUSCULAR | Status: AC
  Filled 2024-05-30: qty 10

## 2024-05-30 MED ORDER — HEPARIN SODIUM (PORCINE) 1000 UNIT/ML IJ SOLN
INTRAMUSCULAR | Status: AC
  Filled 2024-05-30: qty 10

## 2024-05-30 MED ORDER — HEPARIN SODIUM (PORCINE) 1000 UNIT/ML IJ SOLN
INTRAMUSCULAR | Status: DC | PRN
  Administered 2024-05-30: 5000 [IU] via INTRAVENOUS

## 2024-05-30 MED ORDER — HYDRALAZINE HCL 20 MG/ML IJ SOLN
INTRAMUSCULAR | Status: DC | PRN
  Administered 2024-05-30: 10 mg via INTRAVENOUS

## 2024-05-30 MED ORDER — ACETAMINOPHEN 325 MG PO TABS
ORAL_TABLET | ORAL | Status: AC
  Filled 2024-05-30: qty 2

## 2024-05-30 MED ORDER — ASPIRIN 81 MG PO CHEW
CHEWABLE_TABLET | ORAL | Status: AC
  Filled 2024-05-30: qty 1

## 2024-05-30 MED ORDER — SODIUM CHLORIDE 0.9 % IV SOLN: 250.0000 mL | INTRAVENOUS | Status: AC | PRN

## 2024-05-30 MED ORDER — SODIUM CHLORIDE 0.9% FLUSH: 3.0000 mL | INTRAVENOUS | Status: DC | PRN

## 2024-05-30 MED ORDER — LABETALOL HCL 5 MG/ML IV SOLN: 10.0000 mg | INTRAVENOUS | Status: DC | PRN

## 2024-05-30 MED ORDER — ACETAMINOPHEN 325 MG PO TABS
650.0000 mg | ORAL_TABLET | ORAL | Status: DC | PRN
  Administered 2024-05-30 – 2024-06-06 (×2): 650 mg via ORAL

## 2024-05-30 MED ORDER — MAGNESIUM SULFATE 2 GM/50ML IV SOLN
2.0000 g | Freq: Once | INTRAVENOUS | Status: AC
  Administered 2024-05-30: 2 g via INTRAVENOUS
  Filled 2024-05-30: qty 50

## 2024-05-30 MED ORDER — CLOPIDOGREL BISULFATE 75 MG PO TABS
300.0000 mg | ORAL_TABLET | Freq: Once | ORAL | Status: AC
  Administered 2024-05-30: 300 mg via ORAL
  Filled 2024-05-30: qty 4

## 2024-05-30 MED ORDER — IODIXANOL 320 MG/ML IV SOLN
INTRAVENOUS | Status: DC | PRN
  Administered 2024-05-30: 90 mL

## 2024-05-30 MED ORDER — FENTANYL CITRATE (PF) 100 MCG/2ML IJ SOLN
INTRAMUSCULAR | Status: DC | PRN
  Administered 2024-05-30: 50 ug via INTRAVENOUS

## 2024-05-30 MED ORDER — CLOPIDOGREL BISULFATE 300 MG PO TABS
ORAL_TABLET | ORAL | Status: AC
Start: 2024-05-30 — End: 2024-05-30
  Filled 2024-05-30: qty 1

## 2024-05-30 MED ORDER — ASPIRIN 81 MG PO CHEW
CHEWABLE_TABLET | ORAL | Status: DC | PRN
  Administered 2024-05-30: 81 mg via ORAL

## 2024-05-30 MED ORDER — LIDOCAINE HCL (PF) 1 % IJ SOLN
INTRAMUSCULAR | Status: AC
  Filled 2024-05-30: qty 30

## 2024-05-30 MED ORDER — HYDRALAZINE HCL 20 MG/ML IJ SOLN
5.0000 mg | INTRAMUSCULAR | Status: DC | PRN
  Administered 2024-05-30: 5 mg via INTRAVENOUS
  Filled 2024-05-30: qty 1

## 2024-05-30 MED ORDER — HEPARIN (PORCINE) IN NACL 1000-0.9 UT/500ML-% IV SOLN
INTRAVENOUS | Status: DC | PRN
  Administered 2024-05-30 (×2): 500 mL

## 2024-05-30 MED ORDER — LIDOCAINE HCL (PF) 1 % IJ SOLN
INTRAMUSCULAR | Status: DC | PRN
  Administered 2024-05-30: 15 mL via INTRADERMAL

## 2024-05-30 NOTE — Progress Notes (Signed)
 PHARMACIST LIPID MONITORING   [name] is a 78 y.o. female admitted on 05/26/2024 with athersclerosis of native arteries (LLE) with heel ulceration.  Pharmacy has been consulted to optimize lipid-lowering therapy with the indication of secondary prevention for clinical ASCVD.  Recent Labs:  Lipid Panel (last 6 months):   No results found for: CHOL, TRIG, HDL, CHOLHDL, VLDL, LDLCALC, LDLDIRECT  Hepatic function panel (last 6 months):   Lab Results  Component Value Date   AST 21 05/30/2024   ALT 10 05/30/2024   ALKPHOS 103 05/30/2024   BILITOT 0.6 05/30/2024    SCr (since admission):   Serum creatinine: 0.85 mg/dL 92/85/74 9669 Estimated creatinine clearance: 49.2 mL/min  Current therapy and lipid therapy tolerance Current lipid-lowering therapy: atorvastatin  40 mg Documented or reported allergies or intolerances to lipid-lowering therapies (if applicable): n/a  Plan:   Lipid panel ordered for 7/15    Randall JENEANE Call, PharmD, BCPS 05/30/2024, 4:54 PM

## 2024-05-30 NOTE — Inpatient Diabetes Management (Signed)
 Inpatient Diabetes Program Recommendations  AACE/ADA: New Consensus Statement on Inpatient Glycemic Control (2015)  Target Ranges:  Prepandial:   less than 140 mg/dL      Peak postprandial:   less than 180 mg/dL (1-2 hours)      Critically ill patients:  140 - 180 mg/dL    Latest Reference Range & Units 05/29/24 00:23 05/29/24 05:27 05/29/24 07:19 05/29/24 11:27 05/29/24 15:36 05/29/24 19:37  Glucose-Capillary 70 - 99 mg/dL 898 (H) 832 (H)  3 units Novolog   174 (H)  2 units Novolog   248 (H)  5 units Novolog   220 (H)  5 units Novolog   202 (H)  5 units Novolog    (H): Data is abnormally high  Latest Reference Range & Units 05/29/24 23:31 05/30/24 03:51 05/30/24 07:46  Glucose-Capillary 70 - 99 mg/dL 744 (H)  8 units Novolog   143 (H)  2 units Novolog   176 (H)  (H): Data is abnormally high   Home DM Meds: Metformin  XR 750 mg BID  Current Orders: Novolog  Moderate Correction Scale/ SSI (0-15 units) Q4 hours    MD- Note pt NPO for Surgery today  When pt allowed to resume PO diet, please consider adding Novolog  Meal Coverage:   Novolog  4 units TID with meals HOLD if pt NPO HOLD if pt eats <50% meals  May want to change Novolog  SSI to TID [facility] + HS as well     --Will follow patient during hospitalization--  Adina Rudolpho Arrow RN, MSN, CDCES Diabetes Coordinator Inpatient Glycemic Control Team Team Pager: [phone] (8a-5p)

## 2024-05-30 NOTE — Progress Notes (Addendum)
  Progress Note    05/30/2024 9:39 AM   Subjective: No overnight issues  Vitals:   05/30/24 0345 05/30/24 0747  BP: (!) 146/48 (!) 162/55  Pulse: 66 (!) 59  Resp: 17 17  Temp: 98.2 F (36.8 C) 98 F (36.7 C)  SpO2: 97% 99%    Physical Exam: Lungs:  non labored Extremities:  dressing left in place L foot Neurologic: A&O    CBC    Component Value Date/Time   WBC 7.2 05/30/2024 0330   RBC 3.70 (L) 05/30/2024 0330   HGB 9.7 (L) 05/30/2024 0330   HGB 9.4 (L) 05/02/2024 1132   HCT 32.0 (L) 05/30/2024 0330   PLT 155 05/30/2024 0330   PLT 162 05/02/2024 1132   MCV 86.5 05/30/2024 0330   MCH 26.2 05/30/2024 0330   MCHC 30.3 05/30/2024 0330   RDW 16.1 (H) 05/30/2024 0330   LYMPHSABS 1.0 05/30/2024 0330   MONOABS 0.4 05/30/2024 0330   EOSABS 0.1 05/30/2024 0330   BASOSABS 0.0 05/30/2024 0330    BMET    Component Value Date/Time   NA 139 05/30/2024 0330   NA 144 08/21/2017 1553   K 4.3 05/30/2024 0330   CL 104 05/30/2024 0330   CO2 27 05/30/2024 0330   GLUCOSE 147 (H) 05/30/2024 0330   BUN 20 05/30/2024 0330   BUN 21 08/21/2017 1553   CREATININE 0.85 05/30/2024 0330   CREATININE 0.91 05/02/2024 1132   CALCIUM  8.3 (L) 05/30/2024 0330   CALCIUM  9.6 09/19/2010 2220   GFRNONAA >60 05/30/2024 0330   GFRNONAA >60 05/02/2024 1132   GFRAA >60 03/29/2020 0314    INR    Component Value Date/Time   INR 1.2 05/26/2024 1730     Intake/Output Summary (Last 24 hours) at 05/30/2024 0939 Last data filed at 05/30/2024 9094 Gross per 24 hour  Intake 906.53 ml  Output 1900 ml  Net -993.47 ml    Assessment and plan Left heel ulceration  Scheduled for Aortogram, Arteriogram of LLE She does not have any questions regarding procedure Keep NPO Consent ordered H&H stable    Teretha Charlyne RIGGERS [facility] [phone] 05/30/2024 7:51 AM  I have independently interviewed and examined patient and agree with PA assessment and plan above.    Kikue Gerhart C. Sheree, MD [facility] [place] [facility] Office: [phone] Pager: [phone]

## 2024-05-30 NOTE — Progress Notes (Signed)
 Patient arrived at the unit from Cath lab,upon arrival pt is alert and oriented x4,CHG bath given,rt groin incision soft,old drainage marked,dorsalis pedis pulse palpable,pt oriented to the unit,call bell in reach,

## 2024-05-30 NOTE — Progress Notes (Signed)
 PROGRESS NOTE    [name]  FMW:[id number] DOB: 1946-07-24 DOA: 05/26/2024 PCP: Waylan Almarie SAUNDERS, MD   Brief Narrative:  [name] is a 78 y.o. female with medical history significant for T2DM, HLD, HTN, cervical and rectal cancer s/p colostomy, PAD, diabetic foot ulcer,, neuropathy, hypothyroidism, hypomagnesemia, anemia and arthritis who presented to the ED after a fall at home.  Per son, after returning from PCP office, patient had 3 episodes where she lost her balance while ambulating he was able to catch her each time.  Daughter reports that patient was trying to straighten her bed, lost her balance and fell face down on the bed. Patient's left leg was bent and twisted behind her and as daughter attempted to straighten it, she heard a pop. Patient reported significant pain in the leg so she called EMS. At baseline, patient ambulates with a walker however recently, she has been losing strength in her legs. Patient has chronic lower extremity swelling and neuropathy but has had recent increased redness to her legs, L>R.  She she has had poor appetite but denies any dizziness, recent illness, fevers, chills, nausea or vomiting. She was recently treated for possible UTI about a week ago.   ED Course: Initial vitals show patient hypertensive with SBP in the 140s to 190s, otherwise normal vitals. Initial labs significant for glucose 196, creatinine 1.12, WBC 10.4, Hgb 8.8, normal INR. EKG shows normal sinus rhythm. X-ray of the left femur shows moderately angulated midshaft fracture of the left femur.  Pt received fentanyl  100 mcg x 1. Orthopedic surgery was consulted for evaluation. A Buck's traction was placed.  TRH was consulted for admission.   **Interim History: She was taken for surgical intervention and postoperatively she was very somnolent and had a new oxygen requirement.  We transferred her to the PCU.  She is back on Supplemental O2 via [place]. PT/OT to further evaluate and treat  and recommending SNF/CIR and currently continues to have some pain. Underwent aortogram of the Bilaterl LE and had a Left Leg Run-off on 05/30/24. She had a L common iliac artery stent placed.   Assessment and Plan:  Mechanical fall and  Left femur fracture:  Elderly patient presented with left leg pain after a mechanical fall at home. X-ray of left femur shows moderately angulated midshaft fracture of the left femur.  Orthopedic surgery consulted and she is s/p ORIF now. Admitted to MedSurg bed at [facility] but transferred to Progressive;  Buck traction placed in the ED.  Pain control w/ Oxycodone  and as needed IV Hydromorphine. Fall precautions; Will need further care per Ortho and they are recommending weightbearing as tolerated and reinforce the dressings as needed.  They have initiated on enoxaparin  for VTE prophylaxis and they are recommending PT/OT recommending SNF vs CIR   Lower Extremity Cellulitis: Patient with chronic lower extremity edema found to have mild cellulitis of the lower extremities but now has surgical intervention and Leg is wrapper; C/w IV Cefazolin  1 gram po q8h; CTM and Trend CBC, fever curve  Acute Respiratory Failure w/ Hypoxia: Post Op and ? Related to Anesthesia however was hypoxic overnight and had to be placed back on Supplemental O2 via [place]. SpO2: 91 %; O2 Flow Rate (L/min): 2 L/min, FiO2 (%): 21 % -Given IV Lasix  in the PACU. Initial CXR in the PACU showed Stable cardiomediastinal silhouette. Right internal jugular Port-A-Cath is unchanged. Interval placement of left internal jugular catheter with distal tip in expected position of left brachiocephalic  vein. No acute pulmonary disease is noted. Bony thorax is unremarkable. No pneumothorax is seen. -CXR AM showed Subsegmental atelectasis or scarring peripherally at the right lung base. Thoracic spondylosis and bilateral degenerative glenohumeral arthropathy. Aortic Atherosclerosis. Add Flutter Valve, Incentive Spirometry,  and Guaifenesin  1200 mg po BID -Added Xopenex /Atrovent  q6h scheduled  -Was off of Supplemental O2 in the Holding Area and was saturating 100%   Type 2 Diabetes Mellitus complicated by Neuropathy:  Last A1c 6.5% 3 months ago;  Blood sugar elevated to 196 on BMP. Hold metformin  in the setting of pending surgery. Q4H SSI with CBG monitoring while NPO. Follow-up repeat A1c. CBG Trend:  Recent Labs  Lab 05/29/24 1536 05/29/24 1937 05/29/24 2331 05/30/24 0351 05/30/24 0746 05/30/24 1123 05/30/24 1640  GLUCAP 220* 202* 255* 143* 176* 133* 127*  -C/w Gabapentin  600 mg po Daily and Gabapentin  300 mg po qHS   Diabetic Foot Ulcer / Nonhealing pressure ulcer:  Patient with full-thickness ulcer at the posterior left heel with large eschar (see media tab);  No significant improvement in left heel ulcer over the last few weeks.  Followed by podiatry and wound care. Wound care consulted, appreciate recs; See below for LLE Common iliac Intervention. Will likely need Podiatry/Orth ot evaluate for debridement once she heals from her Hip Sx   PAD: ABIs 02/16/2024 are 0.85 on the right biphasic and 1.01 on the left biphasic. Follows with vascular surgery with plan for aortogram with LLE angiogram due to worsening left foot ulcer and toe pressure of 0.  Vascular surgery consulted by Ortho in regards to possible intervention during this hospitalization; Underwent Aortogram/Arteriogram of Bilaterl LE and had stenting of the L Common iliac Artery and was given Plavix  load and is now going to be started on Clopidogrel  75 mg po daily w/ Breakfast -Continue Atorvastatin  as below   Hydronephrosis: Improving CT A/P 5 days ago showed bilateral hydronephrosis, R>L but no visible obstructing stone; Hydronephrosis thought to be due to patient's rectal cancer. Checked Renal U/S and showed  Mild right hydronephrosis which hasimproved since prior CT examination and Interval resolution of left Hydronephrosis. There was also Moderate  right and mild left renal cortical atrophy. Will hold off Urological Consult given her improvement    Chronic Hypomagnesemia:  Unclear etiology, receives intermittent IV magnesium  by cancer center. Magnesium  low at 1.1 on admission.  Current magnesium  level trend: Recent Labs  Lab 05/02/24 1132 05/26/24 1746 05/27/24 0631 05/28/24 0300 05/29/24 1146 05/30/24 0330  MG 0.9* 1.1* 2.2 1.5* 1.3* 1.8  - Replete with IV mag sulfate 2 g now.  CTM and Trend and repeat Mag Level in the AM   Essential HTN:  BP elevated with SBP in the 140s to 170s. C/w IV hydralazine  PRN for SBP > 180. CTM for S/Sx of Bleeding; CTM BP per Protocol. Last BP reading was 122/48   Cervical Cancer / Rectal cancer s/p colostomy: Followed closely with Oncology, patient has declined further chemotherapy or surgery for removal of the remaining tumor.  Continue Vitamin Supplementation   Normocytic Anemia / IDA - Hgb stable at 8.8, baseline 8-9 -Hgb/Hct Trend:  Recent Labs  Lab 05/02/24 1132 05/21/24 1409 05/26/24 1730 05/27/24 0631 05/28/24 0300 05/29/24 1146 05/30/24 0330  HGB 9.4* 9.1* 8.8* 7.2* 10.0* 10.6* 9.7*  HCT 31.5* 31.3* 30.8* 25.1* 31.8* 34.7* 32.0*  MCV 83.3 85.8 85.6 84.8 84.8 87.2 86.5  -Checked Anemia Panel and showed an iron level of 11, UIBC 172, TIBC of 183, saturation ratios of  6%, ferritin level 114, folate level 9.3, and vitamin B12 374.  Continue iron supplementation w/ Ferrous Sulfate  325 mg po MWF -CTM for S/Sx of Bleeding; No overt bleeding noted and repeat CBC in the AM   Chronic BLE edema: Thought to be due to lymphedema;  Apply TED hose however her left leg is now wrapped from her surgery   HLD: C/w Atorvastatin  40 mg po at bedtime; Lipid Panel ordered for the AM and Phamacy consulted to optimize lipid-lowering Therapy w/ indication of 2/2 prevention for clinical ASCVD   Hypothyroidism: C/w Levothyroxine  137 mcg po Daily   GERD/GI Prophylaxis: Continue home PPI w/ Pantoprazole  40 mg  po Daily   Mood Disorder:  Continue Sertraline  150 mg po Daily  Hypoalbuminemia: Patient's Albumin Lvl is now 1.9. CTM and Trend and repeat CMP in the AM  Class I Obesity: Complicates overall prognosis and care. Estimated body mass index is 32.12 kg/m as calculated from the following:   Height as of this encounter: 5' (1.524 m).   Weight as of this encounter: 74.6 kg. Weight Loss and Dietary Counseling given   DVT prophylaxis: Place TED hose Start: 05/26/24 2205 SCDs Start: 05/26/24 1943    Code Status: Full Code Family Communication: No family present @ bedside  Disposition Plan:  Level of care: Progressive Cardiac Status is: Inpatient Remains inpatient appropriate because:  Needs further clinical improvement and clearance by the Specialists. She is getting an Aortogram with Left Leg Run-off today  Consultants:  Orthopedic Surgery Vascular Surgery  Procedures:  Procedures: 27507-Open reduction internal fixation of left periprosthetic femur fracture  Procedure Performed: 1.  Percutaneous ultrasound-guided cannulation right common femoral artery 2.  Catheter aorta and aortogram with bilateral lower extremity angiography 3.  Catheter selection left common femoral artery 4.  Stent of left common iliac artery with 7 x 39 mm VBX 5.  Moderate sedation with fentanyl  and Versed  for 14 minutes    Antimicrobials:  Anti-infectives (From admission, onward)    Start     Dose/Rate Route Frequency Ordered Stop   05/27/24 1410  vancomycin  (VANCOCIN ) powder  Status:  Discontinued          As needed 05/27/24 1410 05/27/24 1528   05/26/24 2245  ceFAZolin  (ANCEF ) IVPB 1 g/50 mL premix        1 g 100 mL/hr over 30 Minutes Intravenous Every 8 hours 05/26/24 2150         Subjective: Seen and examined at bedside in the holding area after her vascular procedure and she was doing okay.  The nurse was holding pressure in her groin where she had the catheterization.  No nausea or vomiting.   Off of supplemental oxygen.  Denies any other concerns or complaints at this time.  Objective: Vitals:   05/30/24 1753 05/30/24 1800 05/30/24 1802 05/30/24 1830  BP:  (!) 98/49 (!) 123/45 (!) 122/48  Pulse: 78 83 86 77  Resp: 20 15 11 12   Temp:      TempSrc:      SpO2: 100% 100% 99% 99%  Weight:      Height:        Intake/Output Summary (Last 24 hours) at 05/30/2024 1944 Last data filed at 05/30/2024 1800 Gross per 24 hour  Intake 1701.03 ml  Output 2400 ml  Net -698.97 ml   Filed Weights   05/27/24 1130 05/28/24 0353 05/30/24 0352  Weight: 70 kg 73.9 kg 74.6 kg   Examination: Physical Exam:  Constitutional: WN/WD obese Caucasian  female who is awake and alert and laying in the bed in the holding area but does appear little uncomfortable Respiratory: Diminished to auscultation bilaterally, no wheezing, rales, rhonchi or crackles. Normal respiratory effort and patient is not tachypenic. No accessory muscle use.  Unlabored breathing is now off of supplemental oxygen nasal cannula Cardiovascular: RRR, no murmurs / rubs / gallops. S1 and S2 auscultated.  Left leg is continued to be wrapped. Abdomen: Soft, non-tender, distended secondary body habitus and has a ostomy in place. Bowel sounds positive.  GU: Deferred. Musculoskeletal: No clubbing / cyanosis of digits/nails. No joint deformity upper and lower extremities.  Skin: No rashes, lesions, ulcers on limited skin evaluation. No induration; Warm and dry.  Neurologic: CN 2-12 grossly intact with no focal deficits. Romberg sign and cerebellar reflexes not assessed.   Data Reviewed: I have personally reviewed following labs and imaging studies  CBC: Recent Labs  Lab 05/26/24 1730 05/27/24 0631 05/28/24 0300 05/29/24 1146 05/30/24 0330  WBC 10.4 5.9 9.0 7.5 7.2  NEUTROABS 8.9*  --  7.9* 6.3 5.6  HGB 8.8* 7.2* 10.0* 10.6* 9.7*  HCT 30.8* 25.1* 31.8* 34.7* 32.0*  MCV 85.6 84.8 84.8 87.2 86.5  PLT 154 121* 149* 155 155    Basic Metabolic Panel: Recent Labs  Lab 05/26/24 1730 05/26/24 1746 05/27/24 0631 05/28/24 0300 05/29/24 1146 05/30/24 0330  NA 139  --  142 139 139 139  K 4.2  --  3.7 3.8 4.6 4.3  CL 104  --  108 105 103 104  CO2 24  --  25 25 23 27   GLUCOSE 196*  --  121* 276* 247* 147*  BUN 20  --  13 16 19 20   CREATININE 1.12*  --  0.92 1.22* 0.94 0.85  CALCIUM  8.4*  --  8.0* 7.8* 8.3* 8.3*  MG  --  1.1* 2.2 1.5* 1.3* 1.8  PHOS  --   --   --  2.9 2.6 3.2   GFR: Estimated Creatinine Clearance: 49.2 mL/min (by C-G formula based on SCr of 0.85 mg/dL). Liver Function Tests: Recent Labs  Lab 05/27/24 0631 05/28/24 0300 05/29/24 1146 05/30/24 0330  AST 14* 24 22 21   ALT 15 14 10 10   ALKPHOS 79 79 106 103  BILITOT 0.5 0.4 0.6 0.6  PROT 5.0* 4.7* 5.3* 4.9*  ALBUMIN 2.1* 2.0* 2.1* 1.9*   No results for input(s): LIPASE, AMYLASE in the last 168 hours. No results for input(s): AMMONIA in the last 168 hours. Coagulation Profile: Recent Labs  Lab 05/26/24 1730  INR 1.2   Cardiac Enzymes: No results for input(s): CKTOTAL, CKMB, CKMBINDEX, TROPONINI in the last 168 hours. BNP (last 3 results) No results for input(s): PROBNP in the last 8760 hours. HbA1C: No results for input(s): HGBA1C in the last 72 hours. CBG: Recent Labs  Lab 05/29/24 2331 05/30/24 0351 05/30/24 0746 05/30/24 1123 05/30/24 1640  GLUCAP 255* 143* 176* 133* 127*   Lipid Profile: No results for input(s): CHOL, HDL, LDLCALC, TRIG, CHOLHDL, LDLDIRECT in the last 72 hours. Thyroid  Function Tests: No results for input(s): TSH, T4TOTAL, FREET4, T3FREE, THYROIDAB in the last 72 hours. Anemia Panel: Recent Labs    05/28/24 0300  VITAMINB12 374  FOLATE 9.3  FERRITIN 114  TIBC 183*  IRON 11*  RETICCTPCT 1.1   Sepsis Labs: No results for input(s): PROCALCITON, LATICACIDVEN in the last 168 hours.  Recent Results (from the past 240 hours)  Surgical pcr screen      Status:  None   Collection Time: 05/27/24 11:08 AM   Specimen: Nasal Mucosa; Nasal Swab  Result Value Ref Range Status   MRSA, PCR NEGATIVE NEGATIVE Final   Staphylococcus aureus NEGATIVE NEGATIVE Final    Comment: (NOTE) The Xpert SA Assay (FDA approved for NASAL specimens in patients 32 years of age and older), is one component of a comprehensive surveillance program. It is not intended to diagnose infection nor to guide or monitor treatment. Performed at [facility], [address], [place], [place] [address]     Radiology Studies: PERIPHERAL VASCULAR CATHETERIZATION Result Date: 05/30/2024 Patient name: [name] MRN: [id number] DOB: [birth year] Sex: female 05/30/2024 Pre-operative Diagnosis: Atherosclerosis native arteries left lower extremity with heel ulceration Post-operative diagnosis:  Same Surgeon:  Penne BROCKS. Sheree, MD Procedure Performed: 1.  Percutaneous ultrasound-guided cannulation right common femoral artery 2.  Catheter aorta and aortogram with bilateral lower extremity angiography 3.  Catheter selection left common femoral artery 4.  Stent of left common iliac artery with 7 x 39 mm VBX 5.  Moderate sedation with fentanyl  and Versed  for 14 minutes Indications: 78 year old female with history of left heel ulceration now admitted with left femur fracture.  She is indicated for angiography with possible invention. Findings: The aorta and iliac segments were heavily calcified.  On the right side there was calcification and the hypogastric artery was occluded there was no evidence of stenosis.  The right common femoral artery was patent with patency of the SFA with 2 areas of 30% stenosis and she had three-vessel runoff to the ankle the peroneal artery was diminutive towards the mid calf.  On the left side as a site of interest there was a heavily calcified stenosed left common iliac artery with a 30 mmHg gradient.  This was stented to 0% residual stenosis.  The hypogastric  artery on the left is occluded but there is a large patent external iliac artery and common femoral artery with patency of the SFA all the way through with three-vessel runoff to the ankle.  At completion there was a small dissection at the distal aorta near the takeoff of the left common femoral artery which was likely an injury secondary to sheath placement which was quite difficult but this was nonflow limiting and there did not appear to be any active extravasation and I elected for no intervention.  Procedure:  The patient was identified in the holding area and taken to room 8.  The patient was then placed supine on the table and prepped and draped in the usual sterile fashion.  A time out was called.  Ultrasound was used to evaluate the right common femoral artery which was quite calcified.  The area was anesthetized 1% lidocaine  and cannulated with a micropuncture needle followed by wire sheath.  Concomitantly we administered fentanyl  and Versed  as moderate sedation her vital signs were monitored throughout the case.  I placed a Bentson wire which was difficult to traverse the calcified iliacs but this was reached into the aorta and then placed a 5 French sheath.  An Omni catheter was placed to the level of L1 and aortogram was performed followed by pelvic angiography with an angled and then pullback gradient was done across the common iliac artery which was 30 mmHg we then placed a wire to the common femoral artery followed by the Omni catheter and perform left lower extremity angiography.  We then placed a Rosen wire and exchanged for a long 6 [place] sheath which was  very difficult to go up and over the bifurcation and this is likely where the dissection of the aorta occurred.  Patient was administered 5000 units of heparin .  We primarily stented the common iliac artery with 7 x 39 mm VBX to 0% residual stenosis.  We then exchanged for a short 6 French sheath on the right perform right lower extremity  angiography with the above findings.  She tolerated the procedure without any complication. Contrast: 90cc Brandon C. Sheree, MD [facility] Office: [phone] Pager: [phone]   DG CHEST PORT 1 VIEW Result Date: 05/30/2024 CLINICAL DATA:  Shortness of breath. History of rectal and cervical cancer. EXAM: PORTABLE CHEST 1 VIEW COMPARISON:  05/29/2024 FINDINGS: Prior ductal right Port-A-Cath tip: Cavoatrial junction. Atherosclerotic calcification of the aortic arch. Thoracic spondylosis and bilateral degenerative glenohumeral arthropathy. Subsegmental atelectasis or scarring peripherally at the right lung base. Heart size within normal limits. The patient is rotated to the right on today's radiograph, reducing diagnostic sensitivity and specificity. No acute findings are identified. IMPRESSION: 1. Subsegmental atelectasis or scarring peripherally at the right lung base. 2. Thoracic spondylosis and bilateral degenerative glenohumeral arthropathy. 3. Aortic Atherosclerosis (ICD10-I70.0). Electronically Signed   By: Ryan Salvage M.D.   On: 05/30/2024 09:53   DG CHEST PORT 1 VIEW Result Date: 05/29/2024 CLINICAL DATA:  Shortness of breath EXAM: PORTABLE CHEST 1 VIEW COMPARISON:  Chest x-ray 04/27/2024 FINDINGS: Right chest port catheter tip ends in the SVC. The cardiomediastinal silhouette is within normal limits. Left-sided central venous catheter has been removed. There is no focal lung infiltrate, pleural effusion or pneumothorax. No acute fractures. IMPRESSION: No active disease. Electronically Signed   By: Greig Pique M.D.   On: 05/29/2024 17:40   Scheduled Meds:  acetaminophen        atorvastatin   40 mg Oral QHS   baclofen   10 mg Oral BID   Chlorhexidine  Gluconate Cloth  6 each Topical Daily   cholecalciferol   2,000 Units Oral Daily   [START ON 05/31/2024] clopidogrel   75 mg Oral Q breakfast   clotrimazole    Topical Daily   cyanocobalamin   1,000 mcg Oral Daily    feeding supplement  237 mL Oral TID BM   ferrous sulfate   325 mg Oral Q M,W,F   gabapentin   300 mg Oral QHS   gabapentin   600 mg Oral Daily   guaiFENesin   1,200 mg Oral BID   insulin  aspart  0-15 Units Subcutaneous Q4H   leptospermum manuka honey  1 Application Topical Daily   levothyroxine   137 mcg Oral Q0600   multivitamin with minerals  1 tablet Oral Q breakfast   oxyCODONE   10 mg Oral Q12H   pantoprazole   40 mg Oral QAC breakfast   sertraline   150 mg Oral QHS   sodium chloride  flush  3 mL Intravenous Q12H   Continuous Infusions:  sodium chloride      sodium chloride  1 mL/kg/hr (05/30/24 1216)    ceFAZolin  (ANCEF ) IV 1 g (05/30/24 1655)    LOS: 4 days   Alejandro Marker, DO [facility] Available via Epic secure chat 7am-7pm After these hours, please refer to coverage provider listed on [email or web address] 05/30/2024, 7:44 PM

## 2024-05-30 NOTE — Progress Notes (Signed)
 PT Cancellation Note  Patient Details Name: [name] MRN: [id number] DOB: 1946/10/13   Cancelled Treatment:    Reason Eval/Treat Not Completed: Patient at procedure or test/unavailable   Deddrick Saindon B [name] 05/30/2024, 9:54 AM Lenoard SQUIBB, PT Acute [facility] Services Office: [phone]

## 2024-05-30 NOTE — Anesthesia Postprocedure Evaluation (Signed)
 Anesthesia Post Note  Patient: EURA MCCAUSLIN  Procedure(s) Performed: OPEN REDUCTION INTERNAL FIXATION (ORIF) DISTAL FEMUR FRACTURE (Left)     Patient location during evaluation: PACU Anesthesia Type: General Level of consciousness: awake and alert Pain management: pain level controlled Vital Signs Assessment: post-procedure vital signs reviewed and stable Respiratory status: spontaneous breathing, nonlabored ventilation, respiratory function stable and patient connected to nasal cannula oxygen Cardiovascular status: blood pressure returned to baseline and stable Postop Assessment: no apparent nausea or vomiting Anesthetic complications: no   No notable events documented.  Last Vitals:  Vitals:   05/30/24 0345 05/30/24 0747  BP: (!) 146/48 (!) 162/55  Pulse: 66 (!) 59  Resp: 17 17  Temp: 36.8 C 36.7 C  SpO2: 97% 99%    Last Pain:  Vitals:   05/30/24 0747  TempSrc: Oral  PainSc:                  Omer Puccinelli S

## 2024-05-30 NOTE — Progress Notes (Signed)
 Site area: Right groin Site Prior to Removal:  Level 0 Pressure Applied For:30 minutes Manual:  Yes  Patient Status During Pull:  Stable Post Pull Site:  Level 0 Post Pull Instructions Given:  Yes Post Pull Pulses Present: Right DP +2, palpable Dressing Applied:  Tegaderm and gauze Bedrest begins @ 1423 Comments:

## 2024-05-30 NOTE — Op Note (Signed)
 Patient name: DARWIN GUASTELLA MRN: [id number] DOB: Mar 27, 1946 Sex: female  05/30/2024 Pre-operative Diagnosis: Atherosclerosis native arteries left lower extremity with heel ulceration Post-operative diagnosis:  Same Surgeon:  Penne BROCKS. Sheree, MD Procedure Performed: 1.  Percutaneous ultrasound-guided cannulation right common femoral artery 2.  Catheter aorta and aortogram with bilateral lower extremity angiography 3.  Catheter selection left common femoral artery 4.  Stent of left common iliac artery with 7 x 39 mm VBX 5.  Moderate sedation with fentanyl  and Versed  for 14 minutes  Indications: 78 year old female with history of left heel ulceration now admitted with left femur fracture.  She is indicated for angiography with possible invention.  Findings: The aorta and iliac segments were heavily calcified.  On the right side there was calcification and the hypogastric artery was occluded there was no evidence of stenosis.  The right common femoral artery was patent with patency of the SFA with 2 areas of 30% stenosis and she had three-vessel runoff to the ankle the peroneal artery was diminutive towards the mid calf.  On the left side as a site of interest there was a heavily calcified stenosed left common iliac artery with a 30 mmHg gradient.  This was stented to 0% residual stenosis.  The hypogastric artery on the left is occluded but there is a large patent external iliac artery and common femoral artery with patency of the SFA all the way through with three-vessel runoff to the ankle.  At completion there was a small dissection at the distal aorta near the takeoff of the left common femoral artery which was likely an injury secondary to sheath placement which was quite difficult but this was nonflow limiting and there did not appear to be any active extravasation and I elected for no intervention.   Procedure:  The patient was identified in the holding area and taken to room 8.  The  patient was then placed supine on the table and prepped and draped in the usual sterile fashion.  A time out was called.  Ultrasound was used to evaluate the right common femoral artery which was quite calcified.  The area was anesthetized 1% lidocaine  and cannulated with a micropuncture needle followed by wire sheath.  Concomitantly we administered fentanyl  and Versed  as moderate sedation her vital signs were monitored throughout the case.  I placed a Bentson wire which was difficult to traverse the calcified iliacs but this was reached into the aorta and then placed a 5 French sheath.  An Omni catheter was placed to the level of L1 and aortogram was performed followed by pelvic angiography with an angled and then pullback gradient was done across the common iliac artery which was 30 mmHg we then placed a wire to the common femoral artery followed by the Omni catheter and perform left lower extremity angiography.  We then placed a Rosen wire and exchanged for a long 6 French sheath which was very difficult to go up and over the bifurcation and this is likely where the dissection of the aorta occurred.  Patient was administered 5000 units of heparin .  We primarily stented the common iliac artery with 7 x 39 mm VBX to 0% residual stenosis.  We then exchanged for a short 6 French sheath on the right perform right lower extremity angiography with the above findings.  She tolerated the procedure without any complication.   Contrast: 90cc  Armya Westerhoff C. Sheree, MD [facility] Office: [phone] Pager: [phone]

## 2024-05-31 ENCOUNTER — Encounter (HOSPITAL_COMMUNITY): Payer: Self-pay | Admitting: Vascular Surgery

## 2024-05-31 DIAGNOSIS — G8918 Other acute postprocedural pain: Secondary | ICD-10-CM | POA: Diagnosis not present

## 2024-05-31 DIAGNOSIS — S7292XA Unspecified fracture of left femur, initial encounter for closed fracture: Secondary | ICD-10-CM | POA: Diagnosis not present

## 2024-05-31 DIAGNOSIS — K5901 Slow transit constipation: Secondary | ICD-10-CM

## 2024-05-31 DIAGNOSIS — L97529 Non-pressure chronic ulcer of other part of left foot with unspecified severity: Secondary | ICD-10-CM

## 2024-05-31 DIAGNOSIS — M9702XA Periprosthetic fracture around internal prosthetic left hip joint, initial encounter: Secondary | ICD-10-CM

## 2024-05-31 DIAGNOSIS — L03115 Cellulitis of right lower limb: Secondary | ICD-10-CM | POA: Diagnosis not present

## 2024-05-31 DIAGNOSIS — W19XXXA Unspecified fall, initial encounter: Secondary | ICD-10-CM | POA: Diagnosis not present

## 2024-05-31 LAB — COMPREHENSIVE METABOLIC PANEL WITH GFR
ALT: 10 U/L (ref 0–44)
AST: 27 U/L (ref 15–41)
Albumin: 1.7 g/dL — ABNORMAL LOW (ref 3.5–5.0)
Alkaline Phosphatase: 129 U/L — ABNORMAL HIGH (ref 38–126)
Anion gap: 10 (ref 5–15)
BUN: 25 mg/dL — ABNORMAL HIGH (ref 8–23)
CO2: 26 mmol/L (ref 22–32)
Calcium: 8.2 mg/dL — ABNORMAL LOW (ref 8.9–10.3)
Chloride: 103 mmol/L (ref 98–111)
Creatinine, Ser: 1.09 mg/dL — ABNORMAL HIGH (ref 0.44–1.00)
GFR, Estimated: 52 mL/min — ABNORMAL LOW (ref >60)
Glucose, Bld: 136 mg/dL — ABNORMAL HIGH (ref 70–99)
Potassium: 4.3 mmol/L (ref 3.5–5.1)
Sodium: 139 mmol/L (ref 135–145)
Total Bilirubin: 0.6 mg/dL (ref 0.0–1.2)
Total Protein: 4.7 g/dL — ABNORMAL LOW (ref 6.5–8.1)

## 2024-05-31 LAB — GLUCOSE, CAPILLARY
Glucose-Capillary: 133 mg/dL — ABNORMAL HIGH (ref 70–99)
Glucose-Capillary: 162 mg/dL — ABNORMAL HIGH (ref 70–99)
Glucose-Capillary: 260 mg/dL — ABNORMAL HIGH (ref 70–99)
Glucose-Capillary: 180 mg/dL — ABNORMAL HIGH (ref 70–99)
Glucose-Capillary: 89 mg/dL (ref 70–99)

## 2024-05-31 LAB — LIPID PANEL
Cholesterol: 77 mg/dL (ref 0–200)
HDL: 23 mg/dL — ABNORMAL LOW (ref >40)
LDL Cholesterol: 41 mg/dL (ref 0–99)
Total CHOL/HDL Ratio: 3.3 ratio
Triglycerides: 65 mg/dL (ref <150)
VLDL: 13 mg/dL (ref 0–40)

## 2024-05-31 LAB — CBC WITH DIFFERENTIAL/PLATELET
Abs Immature Granulocytes: 0.05 K/uL (ref 0.00–0.07)
Basophils Absolute: 0 K/uL (ref 0.0–0.1)
Basophils Relative: 0 %
Eosinophils Absolute: 0.1 K/uL (ref 0.0–0.5)
Eosinophils Relative: 2 %
HCT: 30.3 % — ABNORMAL LOW (ref 36.0–46.0)
Hemoglobin: 8.9 g/dL — ABNORMAL LOW (ref 12.0–15.0)
Immature Granulocytes: 1 %
Lymphocytes Relative: 10 %
Lymphs Abs: 0.8 K/uL (ref 0.7–4.0)
MCH: 25.9 pg — ABNORMAL LOW (ref 26.0–34.0)
MCHC: 29.4 g/dL — ABNORMAL LOW (ref 30.0–36.0)
MCV: 88.1 fL (ref 80.0–100.0)
Monocytes Absolute: 0.5 K/uL (ref 0.1–1.0)
Monocytes Relative: 7 %
Neutro Abs: 6.3 K/uL (ref 1.7–7.7)
Neutrophils Relative %: 80 %
Platelets: 179 K/uL (ref 150–400)
RBC: 3.44 MIL/uL — ABNORMAL LOW (ref 3.87–5.11)
RDW: 16.6 % — ABNORMAL HIGH (ref 11.5–15.5)
WBC: 7.8 K/uL (ref 4.0–10.5)
nRBC: 0 % (ref 0.0–0.2)

## 2024-05-31 LAB — PHOSPHORUS: Phosphorus: 3.6 mg/dL (ref 2.5–4.6)

## 2024-05-31 LAB — MAGNESIUM: Magnesium: 1.6 mg/dL — ABNORMAL LOW (ref 1.7–2.4)

## 2024-05-31 MED ORDER — MAGNESIUM SULFATE 2 GM/50ML IV SOLN
2.0000 g | Freq: Once | INTRAVENOUS | Status: AC
  Administered 2024-05-31: 2 g via INTRAVENOUS
  Filled 2024-05-31: qty 50

## 2024-05-31 MED ORDER — OXYCODONE HCL ER 15 MG PO T12A
15.0000 mg | EXTENDED_RELEASE_TABLET | Freq: Two times a day (BID) | ORAL | Status: DC
  Administered 2024-05-31 – 2024-06-01 (×2): 15 mg via ORAL
  Filled 2024-05-31 (×2): qty 1

## 2024-05-31 MED ORDER — POLYETHYLENE GLYCOL 3350 17 G PO PACK
17.0000 g | PACK | Freq: Two times a day (BID) | ORAL | Status: DC
  Administered 2024-05-31 – 2024-06-08 (×15): 17 g via ORAL
  Filled 2024-05-31 (×17): qty 1

## 2024-05-31 NOTE — Progress Notes (Signed)
 PROGRESS NOTE    [name]  FMW:[id number] DOB: Nov 03, 1946 DOA: 05/26/2024 PCP: Waylan Almarie SAUNDERS, MD   Brief Narrative:  [name] is a 78 y.o. female with medical history significant for T2DM, HLD, HTN, cervical and rectal cancer s/p colostomy, PAD, diabetic foot and heel ulcer,, neuropathy, hypothyroidism, hypomagnesemia, anemia and arthritis who presented to the ED after a fall at home and found to have a moderately angulated midshaft fracture of the left femur.    At baseline, patient ambulates with a walker however recently, she has been losing strength in her legs. Patient has chronic lower extremity swelling and neuropathy but has had recent increased redness to her legs, L>R.  She she has had poor appetite but denies any dizziness, recent illness, fevers, chills, nausea or vomiting. She was recently treated for possible UTI about a week ago.   **Interim History: She was taken for surgical intervention and postoperatively she was very somnolent and had a new oxygen requirement.  We transferred her to the PCU and she was weaned off but then had to be placed back on Supplemental O2 via [place] when she was sleeping. PT/OT to further evaluate and treat and recommending SNF/CIR and currently continues to have some pain. Has a diabetic foot ulcer and Underwent aortogram of the Bilaterl LE and had a Left Leg Run-off on 05/30/24. She had a L common iliac artery stent placed.   CIR feels that she is not as ready yet given her pain control and also recommended obtaining a wound care consult for her groin and perineal/genital wounds.  Will hope to get her pain better under control but she can work with therapy to begin to CIR and then will have her diabetic foot ulcer addressed later then.  Assessment and Plan:  Mechanical fall and  Left femur fracture s/p Fall: X-ray of left femur shows moderately angulated midshaft fracture of the left femur.  Orthopedic surgery consulted and she is s/p  ORIF now. Admitted to MedSurg bed at [facility] but transferred to Progressive;   -Pain control w/ Oxycodone  (Increased to 15 mg po 12h and continuing 2.5-5 mg q6hprn severe Pain) and as needed IV Hydromorphine 1 mg q6hprn Pain not controlled w/ po. Fall precautions;  -Will need further care per Ortho and they are recommending weightbearing as tolerated and reinforce the dressings as needed.   -They have initiated on enoxaparin  for VTE prophylaxis and they are recommending PT/OT recommending SNF vs CIR; CIR evaluating and recommending getting her pain better under control so she could tolerate therapy more which will likely get her to CIR faster.   Lower Extremity Cellulitis: Patient with chronic lower extremity edema found to have mild cellulitis of the lower extremities but now has surgical intervention and Leg is wrapper; C/w IV Cefazolin  1 gram po q8h for now with expected stop date 7/18; CTM and Trend CBC, fever curve  Acute Respiratory Failure w/ Hypoxia: Post Op and ? Related to Anesthesia however was hypoxic overnight and had to be placed back on Supplemental O2 via [place]. SpO2: 97 %; O2 Flow Rate (L/min): 2 L/min, FiO2 (%): 21 %; repeat CXR in the Am -Add Flutter Valve, Incentive Spirometry, and Guaifenesin  1200 mg po BID -Added Xopenex /Atrovent  q6h scheduled  -Was off of Supplemental O2 in the Holding Area and was saturating 100%   Type 2 Diabetes Mellitus complicated by Neuropathy:  Last A1c 6.5% 3 months ago;  Blood sugar elevated to 196 on BMP. Hold metformin  in the  setting of pending surgery. Q4H SSI with CBG monitoring while NPO. Follow-up repeat A1c. CBGs ranging from 89-275. C/w Gabapentin  600 mg po Daily and Gabapentin  300 mg po qHS   Diabetic Foot Ulcer / Nonhealing pressure ulcer:  Patient with full-thickness ulcer at the posterior left heel with large eschar (see media tab);  No significant improvement in left heel ulcer over the last few weeks.  Followed by podiatry and wound care. Wound  care consulted, appreciate recs; See below for LLE Common iliac Intervention. Will likely need Podiatry/Orth ot evaluate for debridement once she heals from her Hip Sx. The C is recommending IR team providing the patient a healing shoe from the [employer] and has been placed orders.   PAD: ABIs 02/16/2024 are 0.85 on the right biphasic and 1.01 on the left biphasic. Follows with vascular surgery with plan for aortogram with LLE angiogram due to worsening left foot ulcer and toe pressure of 0.  Vascular surgery consulted by Ortho in regards to possible intervention during this hospitalization; Underwent Aortogram/Arteriogram of Bilaterl LE and had stenting of the L Common iliac Artery and was given Plavix  load; Now on Clopidogrel  75 mg po daily w/ Breakfast -Continue Atorvastatin  as below   Hydronephrosis: Improving CT A/P 5 days ago showed bilateral hydronephrosis, R>L but no visible obstructing stone; Hydronephrosis thought to be due to patient's rectal cancer. Checked Renal U/S and showed  Mild right hydronephrosis which hasimproved since prior CT examination and Interval resolution of left Hydronephrosis. There was also Moderate right and mild left renal cortical atrophy. Will hold off Urological Consult given her improvement    Chronic Hypomagnesemia:  Unclear etiology, receives intermittent IV magnesium  by cancer center. Magnesium  Level was 1.6 this AM. Replete with IV mag sulfate 2 g now.  CTM and Trend and repeat Mag Level in the AM   Essential HTN:  BP elevated with SBP in the 140s to 170s. C/w IV hydralazine  PRN for SBP > 180. CTM for S/Sx of Bleeding; CTM BP per Protocol. Last BP reading was 131/43   Cervical Cancer / Rectal cancer s/p colostomy: Followed closely with Oncology, patient has declined further chemotherapy or surgery for removal of the remaining tumor.  Continue Vitamin Supplementation   Normocytic Anemia / IDA:  Baseline 8-9; Hgb/Hct Trend: stable but did drop the last 3 days and went  from 10.6/34.7 -. 9.7/32.0 -> 8.9/30.3 Checked Anemia Panel and showed an iron level of 11, UIBC 172, TIBC of 183, saturation ratios of 6%, ferritin level 114, folate level 9.3, and vitamin B12 374.  Continue iron supplementation w/ Ferrous Sulfate  325 mg po MWF -CTM for S/Sx of Bleeding; No overt bleeding noted and repeat CBC in the AM   Chronic BLE edema: Thought to be due to lymphedema;  Apply TED hose however her left leg is now wrapped from her surgery   HLD: C/w Atorvastatin  40 mg po at bedtime; Lipid Panel ordered for the AM and Phamacy consulted to optimize lipid-lowering Therapy w/ indication of 2/2 prevention for clinical ASCVD   Hypothyroidism: C/w Levothyroxine  137 mcg po Daily   GERD/GI Prophylaxis: Continue home PPI w/ Pantoprazole  40 mg po Daily   Mood Disorder:  Continue Sertraline  150 mg po Daily  Constipation: Bowel regimen is being initiated and MiraLAX  has been added along with sorbitol and a soapsuds enema if it does not work  Hypoalbuminemia: Patient's Albumin Lvl is now 1.9. CTM and Trend and repeat CMP in the AM  Perineal/groin and Buttock Wounds: WOC  Nurse Consult as they are bleeding some  Class I Obesity: Complicates overall prognosis and care. Estimated body mass index is 32.12 kg/m as calculated from the following:   Height as of this encounter: 5' (1.524 m).   Weight as of this encounter: 74.6 kg. Weight Loss and Dietary Counseling given   DVT prophylaxis: Place TED hose Start: 05/26/24 2205 SCDs Start: 05/26/24 1943    Code Status: Full Code Family Communication: No family present @ bedside  Disposition Plan:  Level of care: Progressive Cardiac Status is: Inpatient Remains inpatient appropriate because: PT OT recommending CIR versus SNF.  Currently being evaluated for CIR needs further pain control prior to being discharged to CIR   Consultants:  Physiatry Vascular surgery Orthopedic surgery  Procedures:  As delineated as  above  Antimicrobials:  Anti-infectives (From admission, onward)    Start     Dose/Rate Route Frequency Ordered Stop   05/27/24 1410  vancomycin  (VANCOCIN ) powder  Status:  Discontinued          As needed 05/27/24 1410 05/27/24 1528   05/26/24 2245  ceFAZolin  (ANCEF ) IVPB 1 g/50 mL premix        1 g 100 mL/hr over 30 Minutes Intravenous Every 8 hours 05/26/24 2150         Subjective: Seen and examined at bedside and sitting up in the bed and denied any nausea or vomiting.  Feels okay.  PT evaluating and recommended she may be a CIR candidate.  Still continues to have some pain.  No other concerns or complaints at this time.  Objective: Vitals:   05/31/24 0747 05/31/24 1157 05/31/24 1549 05/31/24 1929  BP: (!) 135/42 (!) 134/49 (!) 135/39 (!) 131/43  Pulse: 72 72 74 74  Resp: 11 12 18 17   Temp: 98.5 F (36.9 C) 98.5 F (36.9 C) 98.6 F (37 C) 99.1 F (37.3 C)  TempSrc: Oral Oral Oral Oral  SpO2: 98% 98% 93% 97%  Weight:      Height:        Intake/Output Summary (Last 24 hours) at 05/31/2024 2115 Last data filed at 05/31/2024 1600 Gross per 24 hour  Intake 747.9 ml  Output 400 ml  Net 347.9 ml   Filed Weights   05/27/24 1130 05/28/24 0353 05/30/24 0352  Weight: 70 kg 73.9 kg 74.6 kg   Examination: Physical Exam:  Constitutional: WN/WD obese elderly chronically ill-appearing Caucasian female in no acute distress sitting up in the bedside Respiratory: Diminished to auscultation bilaterally, no wheezing, rales, rhonchi or crackles. Normal respiratory effort and patient is not tachypenic. No accessory muscle use.  Unlabored breathing Cardiovascular: RRR, no murmurs / rubs / gallops. S1 and S2 auscultated.  Has some mild extremity edema on the right Abdomen: Soft, non-tender, distended secondary body habitus and has an ostomy. Bowel sounds positive.  GU: Deferred. Musculoskeletal: No clubbing / cyanosis of digits/nails. No joint deformity upper and lower extremities and  left extremity is wrapped Neurologic: CN 2-12 grossly intact with no focal deficits. Romberg sign and cerebellar reflexes not assessed.  Psychiatric: She is awake and alert  Data Reviewed: I have personally reviewed following labs and imaging studies  CBC: Recent Labs  Lab 05/26/24 1730 05/27/24 0631 05/28/24 0300 05/29/24 1146 05/30/24 0330 05/31/24 0350  WBC 10.4 5.9 9.0 7.5 7.2 7.8  NEUTROABS 8.9*  --  7.9* 6.3 5.6 6.3  HGB 8.8* 7.2* 10.0* 10.6* 9.7* 8.9*  HCT 30.8* 25.1* 31.8* 34.7* 32.0* 30.3*  MCV 85.6 84.8 84.8  87.2 86.5 88.1  PLT 154 121* 149* 155 155 179   Basic Metabolic Panel: Recent Labs  Lab 05/27/24 0631 05/28/24 0300 05/29/24 1146 05/30/24 0330 05/31/24 0350  NA 142 139 139 139 139  K 3.7 3.8 4.6 4.3 4.3  CL 108 105 103 104 103  CO2 25 25 23 27 26   GLUCOSE 121* 276* 247* 147* 136*  BUN 13 16 19 20  25*  CREATININE 0.92 1.22* 0.94 0.85 1.09*  CALCIUM  8.0* 7.8* 8.3* 8.3* 8.2*  MG 2.2 1.5* 1.3* 1.8 1.6*  PHOS  --  2.9 2.6 3.2 3.6   GFR: Estimated Creatinine Clearance: 38.3 mL/min (A) (by C-G formula based on SCr of 1.09 mg/dL (H)). Liver Function Tests: Recent Labs  Lab 05/27/24 0631 05/28/24 0300 05/29/24 1146 05/30/24 0330 05/31/24 0350  AST 14* 24 22 21 27   ALT 15 14 10 10 10   ALKPHOS 79 79 106 103 129*  BILITOT 0.5 0.4 0.6 0.6 0.6  PROT 5.0* 4.7* 5.3* 4.9* 4.7*  ALBUMIN 2.1* 2.0* 2.1* 1.9* 1.7*   No results for input(s): LIPASE, AMYLASE in the last 168 hours. No results for input(s): AMMONIA in the last 168 hours. Coagulation Profile: Recent Labs  Lab 05/26/24 1730  INR 1.2   Cardiac Enzymes: No results for input(s): CKTOTAL, CKMB, CKMBINDEX, TROPONINI in the last 168 hours. BNP (last 3 results) No results for input(s): PROBNP in the last 8760 hours. HbA1C: No results for input(s): HGBA1C in the last 72 hours. CBG: Recent Labs  Lab 05/31/24 0425 05/31/24 0744 05/31/24 1156 05/31/24 1548 05/31/24 1928   GLUCAP 133* 162* 260* 180* 89   Lipid Profile: Recent Labs    05/31/24 0350  CHOL 77  HDL 23*  LDLCALC 41  TRIG 65  CHOLHDL 3.3   Thyroid  Function Tests: No results for input(s): TSH, T4TOTAL, FREET4, T3FREE, THYROIDAB in the last 72 hours. Anemia Panel: No results for input(s): VITAMINB12, FOLATE, FERRITIN, TIBC, IRON, RETICCTPCT in the last 72 hours. Sepsis Labs: No results for input(s): PROCALCITON, LATICACIDVEN in the last 168 hours.  Recent Results (from the past 240 hours)  Surgical pcr screen     Status: None   Collection Time: 05/27/24 11:08 AM   Specimen: Nasal Mucosa; Nasal Swab  Result Value Ref Range Status   MRSA, PCR NEGATIVE NEGATIVE Final   Staphylococcus aureus NEGATIVE NEGATIVE Final    Comment: (NOTE) The Xpert SA Assay (FDA approved for NASAL specimens in patients 54 years of age and older), is one component of a comprehensive surveillance program. It is not intended to diagnose infection nor to guide or monitor treatment. Performed at [facility], [address] [place], [place] [address]     Radiology Studies: PERIPHERAL VASCULAR CATHETERIZATION Result Date: 05/30/2024 Patient name: CAITLIN AINLEY MRN: [id number] DOB: May 15, 1946 Sex: female 05/30/2024 Pre-operative Diagnosis: Atherosclerosis native arteries left lower extremity with heel ulceration Post-operative diagnosis:  Same Surgeon:  Penne BROCKS. Sheree, MD Procedure Performed: 1.  Percutaneous ultrasound-guided cannulation right common femoral artery 2.  Catheter aorta and aortogram with bilateral lower extremity angiography 3.  Catheter selection left common femoral artery 4.  Stent of left common iliac artery with 7 x 39 mm VBX 5.  Moderate sedation with fentanyl  and Versed  for 14 minutes Indications: 78 year old female with history of left heel ulceration now admitted with left femur fracture.  She is indicated for angiography with possible invention. Findings: The  aorta and iliac segments were heavily calcified.  On the right  side there was calcification and the hypogastric artery was occluded there was no evidence of stenosis.  The right common femoral artery was patent with patency of the SFA with 2 areas of 30% stenosis and she had three-vessel runoff to the ankle the peroneal artery was diminutive towards the mid calf.  On the left side as a site of interest there was a heavily calcified stenosed left common iliac artery with a 30 mmHg gradient.  This was stented to 0% residual stenosis.  The hypogastric artery on the left is occluded but there is a large patent external iliac artery and common femoral artery with patency of the SFA all the way through with three-vessel runoff to the ankle.  At completion there was a small dissection at the distal aorta near the takeoff of the left common femoral artery which was likely an injury secondary to sheath placement which was quite difficult but this was nonflow limiting and there did not appear to be any active extravasation and I elected for no intervention.  Procedure:  The patient was identified in the holding area and taken to room 8.  The patient was then placed supine on the table and prepped and draped in the usual sterile fashion.  A time out was called.  Ultrasound was used to evaluate the right common femoral artery which was quite calcified.  The area was anesthetized 1% lidocaine  and cannulated with a micropuncture needle followed by wire sheath.  Concomitantly we administered fentanyl  and Versed  as moderate sedation her vital signs were monitored throughout the case.  I placed a Bentson wire which was difficult to traverse the calcified iliacs but this was reached into the aorta and then placed a 5 French sheath.  An Omni catheter was placed to the level of L1 and aortogram was performed followed by pelvic angiography with an angled and then pullback gradient was done across the common iliac artery which was 30  mmHg we then placed a wire to the common femoral artery followed by the Omni catheter and perform left lower extremity angiography.  We then placed a Rosen wire and exchanged for a long 6 French sheath which was very difficult to go up and over the bifurcation and this is likely where the dissection of the aorta occurred.  Patient was administered 5000 units of heparin .  We primarily stented the common iliac artery with 7 x 39 mm VBX to 0% residual stenosis.  We then exchanged for a short 6 French sheath on the right perform right lower extremity angiography with the above findings.  She tolerated the procedure without any complication. Contrast: 90cc Brandon C. Sheree, MD [facility] [place] Office: [phone] Pager: [phone]   DG CHEST PORT 1 VIEW Result Date: 05/30/2024 CLINICAL DATA:  Shortness of breath. History of rectal and cervical cancer. EXAM: PORTABLE CHEST 1 VIEW COMPARISON:  05/29/2024 FINDINGS: Prior ductal right Port-A-Cath tip: Cavoatrial junction. Atherosclerotic calcification of the aortic arch. Thoracic spondylosis and bilateral degenerative glenohumeral arthropathy. Subsegmental atelectasis or scarring peripherally at the right lung base. Heart size within normal limits. The patient is rotated to the right on today's radiograph, reducing diagnostic sensitivity and specificity. No acute findings are identified. IMPRESSION: 1. Subsegmental atelectasis or scarring peripherally at the right lung base. 2. Thoracic spondylosis and bilateral degenerative glenohumeral arthropathy. 3. Aortic Atherosclerosis (ICD10-I70.0). Electronically Signed   By: Ryan Salvage M.D.   On: 05/30/2024 09:53   Scheduled Meds:  atorvastatin   40 mg Oral QHS  baclofen   10 mg Oral BID   Chlorhexidine  Gluconate Cloth  6 each Topical Daily   cholecalciferol   2,000 Units Oral Daily   clopidogrel   75 mg Oral Q breakfast   clotrimazole    Topical Daily   cyanocobalamin   1,000 mcg Oral  Daily   feeding supplement  237 mL Oral TID BM   ferrous sulfate   325 mg Oral Q M,W,F   gabapentin   300 mg Oral QHS   gabapentin   600 mg Oral Daily   guaiFENesin   1,200 mg Oral BID   insulin  aspart  0-15 Units Subcutaneous Q4H   leptospermum manuka honey  1 Application Topical Daily   levothyroxine   137 mcg Oral Q0600   multivitamin with minerals  1 tablet Oral Q breakfast   oxyCODONE   15 mg Oral Q12H   pantoprazole   40 mg Oral QAC breakfast   polyethylene glycol  17 g Oral BID   sertraline   150 mg Oral QHS   sodium chloride  flush  3 mL Intravenous Q12H   Continuous Infusions:   ceFAZolin  (ANCEF ) IV Stopped (05/31/24 1459)    LOS: 5 days   Alejandro Marker, DO [facility] Available via Epic secure chat 7am-7pm After these hours, please refer to coverage provider listed on [email or web address] 05/31/2024, 9:15 PM

## 2024-05-31 NOTE — Consult Note (Signed)
 Physical Medicine and Rehabilitation Consult Reason for Consult:possible CIR Referring Physician: Dr Alejandro Marker   HPI: [name] is a 78 y.o. R handed female  with hx of rectal/cervical CA- no more treatment;  PAD- severe; Stage I obesity; hypothyroidism, Neuropathy, Dm- A1c 6.2;  And L foot vascular injury/DTI- who had mechanical fall at home and admitted 05/26/24.   Pt found to have L periprosthetic hip fx and underwent ORIF made WBAT on 05/27/24.  Due to wound on L heel/ulcer- she was seen by Vascular, who felt her at high risk for LLE amputation. She underwent B/L aortogram on 7/14 and received a L common iliac artery. However she's getting wound care for B/L Groins.   She has also been dx'd with cellulitis- B/L hydronephrosis which is improving; liikely due to rectal cancer/tumor.  His Magnesium  is chronically low and gets IV MG outpatient, however came in with Mg level of 1.1 and has been fighting her Mg level while here.   She also has chronic LE edema- but that makes sense- her Albumin is 1.9.   She reports her LBM was 5-6 days ago, before admission- and usually takes Miralax  at home.   She reports pain is really bad- usually <5/10 at home, but new pain is 8/10 at best with meds; and 10/10 like her leg is breaking again when meds run out, around 4 hours.    Review of Systems  Constitutional:  Positive for malaise/fatigue.  HENT:  Positive for hearing loss.   Eyes: Negative.   Respiratory:  Positive for shortness of breath.        Mild SOB with talking  Cardiovascular:  Positive for leg swelling.  Gastrointestinal:  Positive for constipation. Negative for abdominal pain and nausea.  Genitourinary:        Is bladder issues at home- and cannot get out of bed without 2 people so is on purewick  Musculoskeletal:  Positive for falls, joint pain and myalgias.  Skin:        L heel wound, not open per nursing, but large- 1.2-2 inches in diameter across L heel). Also  redness/cellulitis of LLE and some mild redness of RLE; also has Chronic LE edema.  Neurological:  Positive for focal weakness and weakness.  Endo/Heme/Allergies:  Bruises/bleeds easily.  Psychiatric/Behavioral: Negative.    All other systems reviewed and are negative.  Past Medical History:  Diagnosis Date   Arthritis    Basal ganglia stroke [facility])    March 2022   Bilateral lower extremity edema    Cancer (HCC)    twice- colon cancer, unsure of the other type of cancer   Carotid artery occlusion    Flank pain    Full dentures    History of cervical cancer    11/ 1994  Stage IB  s/p  high dose radiation brachytherapy @ [facility] 01/ 1995--- per pt no recurrence   History of chronic bronchitis    History of hyperthyroidism    d 03/ 2011---due to grave's disease--- s/p  RAI treatment 04/ 2011   History of kidney stones    History of sepsis 05/03/2018   due to UTI with klebsiella/ pyelonephritis/ ureteral obstruction cause by stone   Hyperlipidemia    Hypothyroidism, postradioiodine therapy    endocrinologist--  dr kassie--  dx graves disease and s/p RAI i131 treatement 4/ 2011   Nauseated    Oral thrush 05/03/2018   Pneumonia    Type 2 diabetes mellitus treated  with insulin  (HCC)    FOLLOWED BY PCP   Urgency of urination    Wears glasses    Past Surgical History:  Procedure Laterality Date   ABDOMINAL AORTOGRAM N/A 05/30/2024   Procedure: ABDOMINAL AORTOGRAM;  Surgeon: Sheree Penne Bruckner, MD;  Location: [facility] INVASIVE CV LAB;  Service: Cardiovascular;  Laterality: N/A;   APPENDECTOMY     BIOPSY  01/23/2021   Procedure: BIOPSY;  Surgeon: Shila Gustav GAILS, MD;  Location: [facility];  Service: Endoscopy;;   BIOPSY  10/12/2021   Procedure: BIOPSY;  Surgeon: Eda Iha, MD;  Location: [facility];  Service: Gastroenterology;;   BIOPSY  09/11/2023   Procedure: BIOPSY;  Surgeon: Charlanne Groom, MD;  Location: [facility];  Service: Gastroenterology;;   BIOPSY   10/13/2023   Procedure: BIOPSY;  Surgeon: Federico Rosario BROCKS, MD;  Location: [facility];  Service: Gastroenterology;;   CAROTID ENDARTERECTOMY Right 03/28/2020   CATARACT EXTRACTION W/ INTRAOCULAR LENS  IMPLANT, BILATERAL  2004  approx.   COLONOSCOPY     COLONOSCOPY WITH PROPOFOL  N/A 01/23/2021   Procedure: COLONOSCOPY WITH PROPOFOL ;  Surgeon: Shila Gustav GAILS, MD;  Location: [facility];  Service: Endoscopy;  Laterality: N/A;   CYSTOSCOPY WITH STENT PLACEMENT Right 05/04/2018   Procedure: CYSTOSCOPY WITH STENT PLACEMENT AND RETROGRADE PYELOGRAM;  Surgeon: Devere Bruckner Righter, MD;  Location: [facility];  Service: Urology;  Laterality: Right;   CYSTOSCOPY/URETEROSCOPY/HOLMIUM LASER/STENT PLACEMENT Right 05/19/2018   Procedure: CYSTOSCOPY, URETEROSCOPY/HOLMIUM LASER, STONE BASKETRY/ STENT EXCHANGE;  Surgeon: Devere Bruckner Righter, MD;  Location: [facility];  Service: Urology;  Laterality: Right;   ENDARTERECTOMY Right 03/28/2020   Procedure: RIGHT CAROTID ENDARTERECTOMY with PATCH ANGIOPLASTY;  Surgeon: Oris Krystal FALCON, MD;  Location: [facility] OR;  Service: Vascular;  Laterality: Right;   ENTEROSCOPY N/A 10/13/2023   Procedure: ENTEROSCOPY;  Surgeon: Federico Rosario BROCKS, MD;  Location: [facility];  Service: Gastroenterology;  Laterality: N/A;   ESOPHAGOGASTRODUODENOSCOPY (EGD) WITH PROPOFOL  N/A 10/12/2021   Procedure: ESOPHAGOGASTRODUODENOSCOPY (EGD) WITH PROPOFOL ;  Surgeon: Eda Iha, MD;  Location: [facility];  Service: Gastroenterology;  Laterality: N/A;   ESOPHAGOGASTRODUODENOSCOPY (EGD) WITH PROPOFOL  N/A 09/11/2023   Procedure: ESOPHAGOGASTRODUODENOSCOPY (EGD) WITH PROPOFOL ;  Surgeon: Charlanne Groom, MD;  Location: [facility];  Service: Gastroenterology;  Laterality: N/A;   EXCISION MASS LEFT CHEST WALL  09-20-2007   dr sebastian  [facility]   HOT HEMOSTASIS  10/13/2023   Procedure: HOT HEMOSTASIS (ARGON PLASMA COAGULATION/BICAP);  Surgeon: Federico Rosario BROCKS, MD;  Location: [facility];  Service: Gastroenterology;;   LOWER EXTREMITY ANGIOGRAPHY N/A 05/30/2024   Procedure: Lower Extremity Angiography;  Surgeon: Sheree Penne Bruckner, MD;  Location: [facility] INVASIVE CV LAB;  Service: Cardiovascular;  Laterality: N/A;   LOWER EXTREMITY INTERVENTION N/A 05/30/2024   Procedure: LOWER EXTREMITY INTERVENTION;  Surgeon: Sheree Penne Bruckner, MD;  Location: [facility] INVASIVE CV LAB;  Service: Cardiovascular;  Laterality: N/A;   ORIF FEMUR FRACTURE Left 05/27/2024   Procedure: OPEN REDUCTION INTERNAL FIXATION (ORIF) DISTAL FEMUR FRACTURE;  Surgeon: Kendal Franky SQUIBB, MD;  Location: [facility] OR;  Service: Orthopedics;  Laterality: Left;   Radioactive Iodine  Therapy     for thyroid    REVISION TOTAL HIP ARTHROPLASTY Left early 2000s   SUBMUCOSAL TATTOO INJECTION  01/23/2021   Procedure: SUBMUCOSAL TATTOO INJECTION;  Surgeon: Shila Gustav GAILS, MD;  Location: [facility];  Service: Endoscopy;;   TANDEM RING INSERTION  1995   dr olean @ [facility]   EUA w/ tandem placement in ovid  (for direct high dose radiation  brachytherapy , cervical cancer)   TOTAL HIP ARTHROPLASTY Left 1990s   TRANSTHORACIC ECHOCARDIOGRAM  08/07/2017   mild focal basal hypertrophy of the septum,  ef 60-65%, grade 1 diastolic dysfunction/  atrial septum with lipomatous hypertrophy/  trivial PR   UPPER GASTROINTESTINAL ENDOSCOPY     Family History  Problem Relation Age of Onset   Emphysema Father    Diabetes Father    Emphysema Sister    Emphysema Brother    Cancer Mother        Skin Cancer   Diabetes Mother    Colon cancer Neg Hx    Esophageal cancer Neg Hx    Rectal cancer Neg Hx    Stomach cancer Neg Hx    Social History:  reports that she quit smoking about 42 years ago. Her smoking use included cigarettes. She started smoking about 47 years ago. She has been exposed to tobacco smoke. She has never used smokeless tobacco. She reports that she does not drink alcohol and does not use drugs. Allergies:   Allergies  Allergen Reactions   Codeine Hives and Nausea Only   Medications Prior to Admission  Medication Sig Dispense Refill   acetaminophen  (TYLENOL ) 500 MG tablet Take 500 mg by mouth daily as needed for moderate pain (pain score 4-6) or headache.     atorvastatin  (LIPITOR) 40 MG tablet Take 40 mg by mouth at bedtime.     baclofen  (LIORESAL ) 10 MG tablet Take 1 tablet (10 mg total) by mouth 2 (two) times daily. 30 each 0   busPIRone (BUSPAR) 5 MG tablet Take 5 mg by mouth 2 (two) times daily as needed (for anxiety).     CALPROTECT 0.44-20.6 % OINT Apply 1 application  topically daily as needed (for irritation- affected sites).     [EXPIRED] cefpodoxime  (VANTIN ) 200 MG tablet Take 1 tablet (200 mg total) by mouth 2 (two) times daily for 7 days. 14 tablet 0   Cholecalciferol  (VITAMIN D3 SUPER STRENGTH) 50 MCG (2000 UT) CAPS Take 2,000 Units by mouth daily.     clotrimazole -betamethasone  (LOTRISONE ) cream APPLY CREAM TOPICALLY ONCE DAILY TO HEELS *NEW PRESCRIPTION REQUEST* (Patient taking differently: Apply 1 Application topically See admin instructions. APPLY CREAM TOPICALLY ONCE DAILY TO HEELS) 45 g 11   Continuous Glucose Sensor (FREESTYLE LIBRE 3 SENSOR) MISC Inject 1 Device into the skin every 14 (fourteen) days.     cyanocobalamin  (VITAMIN B12) 1000 MCG tablet Take 1,000 mcg by mouth daily.     EUTHYROX  137 MCG tablet Take 137 mcg by mouth daily before breakfast.     ferrous sulfate  325 (65 FE) MG tablet Take 325 mg by mouth See admin instructions. Take 325 mg by mouth with food on Mon/Wed/Fri     gabapentin  (NEURONTIN ) 300 MG capsule Take 300-600 mg by mouth See admin instructions. Take 600 mg by mouth in the morning and 300 mg at bedtime     Homeopathic Products (LEG CRAMPS) TABS Take 1 tablet by mouth 2 (two) times daily as needed (leg pain).     lidocaine -prilocaine  (EMLA ) cream APPLY 1/2 (ONE-HALF) TABLESPOON TO PORT SITE 1 TO 2 HOURS PRIOR TO STICK AND COVER WITH PRESS-AND-SEAL TO  NUMB SITE. *NEW PRESCRIPTION REQUEST* (Patient taking differently: Apply 1 Application topically See admin instructions. APPLY 1/2 (ONE-HALF) TABLESPOONFUL TO PORT SITE 1 TO 2 HOURS PRIOR TO STICK AND COVER WITH PRESS-AND-SEAL TO NUMB SITE) 30 g 2   metFORMIN  (GLUCOPHAGE -XR) 750 MG 24 hr tablet Take 750 mg by mouth  2 (two) times daily with a meal.     Multiple Vitamin (MULTIVITAMIN) tablet Take 1 tablet by mouth daily with breakfast.     nystatin  (NYAMYC ) powder APPLY POWDER TOPICALLY TWICE DAILY TO GROIN RASH (Patient taking differently: Apply 1 Application topically 2 (two) times daily as needed (for groin rashes).) 15 g 5   pantoprazole  (PROTONIX ) 40 MG tablet Take 1 tablet (40 mg total) by mouth daily. (Patient taking differently: Take 40 mg by mouth daily before breakfast.) 90 tablet 0   sertraline  (ZOLOFT ) 100 MG tablet Take 150 mg by mouth at bedtime.     XTAMPZA  ER 9 MG C12A Take 9 mg by mouth every 12 (twelve) hours.     Continuous Glucose Receiver (FREESTYLE LIBRE 3 READER) DEVI as directed.     magnesium  oxide (MAG-OX) 400 MG tablet TAKE TWO (2) TABLETS BY MOUTH TWICE DAILY *NEW PRESCRIPTION REQUEST* (Patient not taking: Reported on 05/26/2024) 360 tablet 1   MAGnesium -Oxide 400 (240 Mg) MG tablet Take 2 tablets (800 mg total) by mouth 2 (two) times daily. (Patient not taking: Reported on 05/26/2024) 120 tablet 0   [DISCONTINUED] oxyCODONE  (ROXICODONE ) 5 MG immediate release tablet Take 1 tablet (5 mg total) by mouth every 4 (four) hours as needed for severe pain (pain score 7-10). (Patient not taking: Reported on 05/26/2024) 20 tablet 0    Home: Home Living Family/patient expects to be discharged to:: Private residence Living Arrangements: Spouse/significant other Available Help at Discharge: Family, Available PRN/intermittently Type of Home: House Home Access: Ramped entrance Home Layout: One level Bathroom Shower/Tub: Health visitor: Standard Bathroom Accessibility:  Yes Home Equipment: Agricultural consultant (2 wheels), BSC/3in1, Rollator (4 wheels), Shower seat Additional Comments: Spouse is not in good health. She provides supervision assist for spouse. Pt has what sounds like HHPT states a girl comes to her house 1x week for 15-30 min and helps her sit down and stand up.  Functional History: Prior Function Prior Level of Function : History of Falls (last six months), Independent/Modified Independent Mobility Comments: She reported using a rollator for ambualtion. Pt states she has had atleast 5 falls in 6 months. ADLs Comments: Pt reports needing assist for socks/shoes, supervision for shower transfers, daughter manages medications Functional Status:  Mobility: Bed Mobility Overal bed mobility: Needs Assistance Bed Mobility: Supine to Sit Supine to sit: Max assist General bed mobility comments: use of chux pad for transfer to EOB Transfers Overall transfer level: Needs assistance Equipment used: Ambulation equipment used Transfers: Bed to chair/wheelchair/BSC, Sit to/from Stand Sit to Stand: Max assist Bed to/from chair/wheelchair/BSC transfer type:: Via Lift equipment Squat pivot transfers: Max Banker via Lift Equipment: Stedy General transfer comment: Max +2 for STS with stedy, difficulty using UE support to pull to stand Ambulation/Gait General Gait Details: Unable at this time.    ADL: ADL Overall ADL's : Needs assistance/impaired Eating/Feeding: Set up, Sitting Grooming: Set up, Sitting Upper Body Bathing: Set up, Sitting Lower Body Bathing: Sit to/from stand, Maximal assistance Upper Body Dressing : Set up, Sitting Lower Body Dressing: Maximal assistance, Sit to/from stand Toilet Transfer: Maximal assistance, +2 for safety/equipment, +2 for physical assistance Toilet Transfer Details (indicate cue type and reason): Simulated in room with stedy Toileting- Clothing Manipulation and Hygiene: Total assistance, Sitting/lateral lean,  Sit to/from stand General ADL Comments: Pt limited by pain in LLE  Cognition: Cognition Orientation Level: Oriented X4 Cognition Arousal: Alert Behavior During Therapy: Anxious, WFL for tasks assessed/performed  Blood pressure (!) 134/49, pulse  72, temperature 98.5 F (36.9 C), temperature source Oral, resp. rate 12, height 5' (1.524 m), weight 74.6 kg, SpO2 98%. Physical Exam Vitals and nursing note reviewed. Exam conducted with a chaperone present.  Constitutional:      Appearance: She is obese.     Comments: Appears younger than stated age; sitting up in bed; slightly breathing fast, when talking, No acute distress, but appears in significant pain, wincing and almost in tears about turning for me  HENT:     Right Ear: External ear normal.     Left Ear: External ear normal.     Nose: Nose normal. No congestion.     Mouth/Throat:     Mouth: Mucous membranes are dry.     Pharynx: Oropharynx is clear. No oropharyngeal exudate.  Eyes:     General:        Right eye: No discharge.        Left eye: No discharge.     Extraocular Movements: Extraocular movements intact.  Cardiovascular:     Rate and Rhythm: Normal rate and regular rhythm.     Heart sounds: Normal heart sounds. No murmur heard.    No gallop.  Pulmonary:     Breath sounds: No rhonchi.     Comments: Appears slightly SOB talking, but CTA B/L- might be pain? Slightly faster esp rate Abdominal:     Comments: Somewhat soft, but firmer than expected; NT; but distended; hypoactive  Genitourinary:    Comments: Purewick in place, but had some blood on it from some opened scabs around her perineum/genitals- also covered in new powder, so it's clear she was just changed- rash on backside- doesn't appear to be MASD, or if so, is mild Musculoskeletal:     Cervical back: Neck supple.     Comments:  Ue's 4+/5 throughout RLE- 4+/5 thorughout LLE- 2-/5 HF; can barely bend L knee at all- cannot test KE/KF due to pain;  DF/PF 4/5-  likely due to heel pain  Skin:    General: Skin is warm and dry.     Comments: See GU; 2+ LE edema on RLE- to knee- mild erythema splotchy more than anything on RLE- Cast/ACE wrap on LLE- can wiggle toes- 2+ edema of L toes Wrapped L heel, but per nurse who just wrapped it, 1.5-2 inches in diameter black puffy tissue B/L groins- 1 cm in diameter raw tissue- covered with tegaderm  L lateral hip incision covered with dressing  Neurological:     Mental Status: She is alert.     Comments: Decreased to light touch below knees B/L- chronic per pt   Psychiatric:     Comments: Wincing in pain- flat affect     Results for orders placed or performed during the hospital encounter of 05/26/24 (from the past 24 hours)  Glucose, capillary     Status: Abnormal   Collection Time: 05/30/24  4:40 PM  Result Value Ref Range   Glucose-Capillary 127 (H) 70 - 99 mg/dL  Glucose, capillary     Status: Abnormal   Collection Time: 05/30/24  7:53 PM  Result Value Ref Range   Glucose-Capillary 275 (H) 70 - 99 mg/dL   Comment 1 Notify RN    Comment 2 Document in Chart   Glucose, capillary     Status: Abnormal   Collection Time: 05/30/24 11:44 PM  Result Value Ref Range   Glucose-Capillary 262 (H) 70 - 99 mg/dL   Comment 1 Notify RN    Comment  2 Document in Chart   Lipid panel     Status: Abnormal   Collection Time: 05/31/24  3:50 AM  Result Value Ref Range   Cholesterol 77 0 - 200 mg/dL   Triglycerides 65 <849 mg/dL   HDL 23 (L) >59 mg/dL   Total CHOL/HDL Ratio 3.3 RATIO   VLDL 13 0 - 40 mg/dL   LDL Cholesterol 41 0 - 99 mg/dL  CBC with Differential/Platelet     Status: Abnormal   Collection Time: 05/31/24  3:50 AM  Result Value Ref Range   WBC 7.8 4.0 - 10.5 K/uL   RBC 3.44 (L) 3.87 - 5.11 MIL/uL   Hemoglobin 8.9 (L) 12.0 - 15.0 g/dL   HCT 69.6 (L) 63.9 - 53.9 %   MCV 88.1 80.0 - 100.0 fL   MCH 25.9 (L) 26.0 - 34.0 pg   MCHC 29.4 (L) 30.0 - 36.0 g/dL   RDW 83.3 (H) 88.4 - 84.4 %   Platelets  179 150 - 400 K/uL   nRBC 0.0 0.0 - 0.2 %   Neutrophils Relative % 80 %   Neutro Abs 6.3 1.7 - 7.7 K/uL   Lymphocytes Relative 10 %   Lymphs Abs 0.8 0.7 - 4.0 K/uL   Monocytes Relative 7 %   Monocytes Absolute 0.5 0.1 - 1.0 K/uL   Eosinophils Relative 2 %   Eosinophils Absolute 0.1 0.0 - 0.5 K/uL   Basophils Relative 0 %   Basophils Absolute 0.0 0.0 - 0.1 K/uL   Immature Granulocytes 1 %   Abs Immature Granulocytes 0.05 0.00 - 0.07 K/uL  Comprehensive metabolic panel with GFR     Status: Abnormal   Collection Time: 05/31/24  3:50 AM  Result Value Ref Range   Sodium 139 135 - 145 mmol/L   Potassium 4.3 3.5 - 5.1 mmol/L   Chloride 103 98 - 111 mmol/L   CO2 26 22 - 32 mmol/L   Glucose, Bld 136 (H) 70 - 99 mg/dL   BUN 25 (H) 8 - 23 mg/dL   Creatinine, Ser 8.90 (H) 0.44 - 1.00 mg/dL   Calcium  8.2 (L) 8.9 - 10.3 mg/dL   Total Protein 4.7 (L) 6.5 - 8.1 g/dL   Albumin 1.7 (L) 3.5 - 5.0 g/dL   AST 27 15 - 41 U/L   ALT 10 0 - 44 U/L   Alkaline Phosphatase 129 (H) 38 - 126 U/L   Total Bilirubin 0.6 0.0 - 1.2 mg/dL   GFR, Estimated 52 (L) >60 mL/min   Anion gap 10 5 - 15  Magnesium      Status: Abnormal   Collection Time: 05/31/24  3:50 AM  Result Value Ref Range   Magnesium  1.6 (L) 1.7 - 2.4 mg/dL  Phosphorus     Status: None   Collection Time: 05/31/24  3:50 AM  Result Value Ref Range   Phosphorus 3.6 2.5 - 4.6 mg/dL  Glucose, capillary     Status: Abnormal   Collection Time: 05/31/24  4:25 AM  Result Value Ref Range   Glucose-Capillary 133 (H) 70 - 99 mg/dL  Glucose, capillary     Status: Abnormal   Collection Time: 05/31/24  7:44 AM  Result Value Ref Range   Glucose-Capillary 162 (H) 70 - 99 mg/dL  Glucose, capillary     Status: Abnormal   Collection Time: 05/31/24 11:56 AM  Result Value Ref Range   Glucose-Capillary 260 (H) 70 - 99 mg/dL   PERIPHERAL VASCULAR CATHETERIZATION Result Date: 05/30/2024 Patient name:  [name] MRN: [id number] DOB: 23-Jul-1946 Sex: female  05/30/2024 Pre-operative Diagnosis: Atherosclerosis native arteries left lower extremity with heel ulceration Post-operative diagnosis:  Same Surgeon:  Penne BROCKS. Sheree, MD Procedure Performed: 1.  Percutaneous ultrasound-guided cannulation right common femoral artery 2.  Catheter aorta and aortogram with bilateral lower extremity angiography 3.  Catheter selection left common femoral artery 4.  Stent of left common iliac artery with 7 x 39 mm VBX 5.  Moderate sedation with fentanyl  and Versed  for 14 minutes Indications: 78 year old female with history of left heel ulceration now admitted with left femur fracture.  She is indicated for angiography with possible invention. Findings: The aorta and iliac segments were heavily calcified.  On the right side there was calcification and the hypogastric artery was occluded there was no evidence of stenosis.  The right common femoral artery was patent with patency of the SFA with 2 areas of 30% stenosis and she had three-vessel runoff to the ankle the peroneal artery was diminutive towards the mid calf.  On the left side as a site of interest there was a heavily calcified stenosed left common iliac artery with a 30 mmHg gradient.  This was stented to 0% residual stenosis.  The hypogastric artery on the left is occluded but there is a large patent external iliac artery and common femoral artery with patency of the SFA all the way through with three-vessel runoff to the ankle.  At completion there was a small dissection at the distal aorta near the takeoff of the left common femoral artery which was likely an injury secondary to sheath placement which was quite difficult but this was nonflow limiting and there did not appear to be any active extravasation and I elected for no intervention.  Procedure:  The patient was identified in the holding area and taken to room 8.  The patient was then placed supine on the table and prepped and draped in the usual sterile fashion.  A time  out was called.  Ultrasound was used to evaluate the right common femoral artery which was quite calcified.  The area was anesthetized 1% lidocaine  and cannulated with a micropuncture needle followed by wire sheath.  Concomitantly we administered fentanyl  and Versed  as moderate sedation her vital signs were monitored throughout the case.  I placed a Bentson wire which was difficult to traverse the calcified iliacs but this was reached into the aorta and then placed a 5 French sheath.  An Omni catheter was placed to the level of L1 and aortogram was performed followed by pelvic angiography with an angled and then pullback gradient was done across the common iliac artery which was 30 mmHg we then placed a wire to the common femoral artery followed by the Omni catheter and perform left lower extremity angiography.  We then placed a Rosen wire and exchanged for a long 6 French sheath which was very difficult to go up and over the bifurcation and this is likely where the dissection of the aorta occurred.  Patient was administered 5000 units of heparin .  We primarily stented the common iliac artery with 7 x 39 mm VBX to 0% residual stenosis.  We then exchanged for a short 6 French sheath on the right perform right lower extremity angiography with the above findings.  She tolerated the procedure without any complication. Contrast: 90cc Brandon C. Sheree, MD [facility] Office: [phone] Pager: [phone]   DG CHEST PORT 1 VIEW Result Date: 05/30/2024 CLINICAL DATA:  Shortness of breath. History of rectal and cervical cancer. EXAM: PORTABLE CHEST 1 VIEW COMPARISON:  05/29/2024 FINDINGS: Prior ductal right Port-A-Cath tip: Cavoatrial junction. Atherosclerotic calcification of the aortic arch. Thoracic spondylosis and bilateral degenerative glenohumeral arthropathy. Subsegmental atelectasis or scarring peripherally at the right lung base. Heart size within normal limits. The patient is  rotated to the right on today's radiograph, reducing diagnostic sensitivity and specificity. No acute findings are identified. IMPRESSION: 1. Subsegmental atelectasis or scarring peripherally at the right lung base. 2. Thoracic spondylosis and bilateral degenerative glenohumeral arthropathy. 3. Aortic Atherosclerosis (ICD10-I70.0). Electronically Signed   By: Ryan Salvage M.D.   On: 05/30/2024 09:53     Assessment/Plan: Diagnosis:  L hip periprosthetic hip fx after mech fall Does the need for close, 24 hr/day medical supervision in concert with the patient's rehab needs make it unreasonable for this patient to be served in a less intensive setting? Yes Co-Morbidities requiring supervision/potential complications:  L foot ulcer s/p  L common femoral stent for heel ulceration, high risk of LLE amputation; rectal/cervical cancer- not doing anymore treatment;  Chronic LE edema; PAD; HTN, DM A1c 6.2; hypothyroidism, neuropathy; LLE cellulitis.  Due to bladder management, bowel management, safety, skin/wound care, disease management, medication administration, pain management, and patient education, does the patient require 24 hr/day rehab nursing? Yes Does the patient require coordinated care of a physician, rehab nurse, therapy disciplines of PT, OT to address physical and functional deficits in the context of the above medical diagnosis(es)? Yes Addressing deficits in the following areas: balance, endurance, locomotion, strength, transferring, bowel/bladder control, bathing, dressing, feeding, grooming, and toileting Can the patient actively participate in an intensive therapy program of at least 3 hrs of therapy per day at least 5 days per week? Yes The potential for patient to make measurable gains while on inpatient rehab is fair to good Anticipated functional outcomes upon discharge from inpatient rehab are supervision  to min A with PT, supervision and min assist with OT, n/a with  SLP. Estimated rehab length of stay to reach the above functional goals is: ~ 14 days Anticipated discharge destination: Home Overall Rehab/Functional Prognosis: good and fair  RECOMMENDATIONS: This patient's condition is appropriate for continued rehabilitative care in the following setting: CIR but isn't ready quite yet, due to pain control Patient has agreed to participate in recommended program. Yes Note that insurance prior authorization may be required for reimbursement for recommended care.  Comment: Pt's pain got down to 8/10 today with therapy and couldn't tolerate standing more than a few moments and pain truly impacted her ability to participate- I suggest increasing her home Oxycontin  to 15 mg BID- as well as increasing frequency, not dose on Oxycodone  to q4 hours prn. That way she might be able to get to CIR faster, which based on her notes, I'm concerned she is progressing pretty slow, not due to function, but due to pain.  I suggest a healing shoe from [employer]- put [employer] into the orders- and write for a healing shoe- to take weight off Heel due to vascular lesion on L heel. It's worth knowing her shoe size to help it fit better.  She hasn't had a BM since admission to hospital- which was 5-6 days ago- suggest adding in her home Miralax  daily- if this doesn't work by tomorrow, I'd give Sorbitol 30cc x1 and then 4 hours later, give soap suds enema if it doesn't work.  Reached out to Dr. Sherrill about most of these medical issues that  I was concerned will affect her ability to do therapy/get home.  Suggest WOC for groin/perineal and genital wounds- they didn't see last time and bleeding some.  I am not sure, but WOC might suggest a foley short term, because I think Purewick making skin worse? Would d/w them? Will see what pt does tomorrow, and if doing better, will try to send to insurance for inpt rehab recs- Pt in the longer run, once pain controlled, is a good candidate for pt rehab,  due to L hip periprosthetic fx, as well as complex medical issues with skin, L heel ulcer, Albumin 1.9; hydronephrosis, low Mg, and pain control.  Thank you for this consult.   Ellison Leisure, MD 05/31/2024   I spent a total of 84   minutes on total care today- >50% coordination of care- due to  Review of chart, pt seen with nurse and interviewed; documentation, and reached out to Primary physician as well as  admissions coordinator and d/w nurse-

## 2024-05-31 NOTE — Progress Notes (Signed)
  Progress Note    05/31/2024 10:12 AM 1 Day Post-Op  Subjective: No new issues, still left leg pain and  Vitals:   05/31/24 0233 05/31/24 0747  BP: (!) 112/37 (!) 135/42  Pulse: 66 72  Resp: 14 11  Temp: 98.4 F (36.9 C) 98.5 F (36.9 C)  SpO2: 96% 98%    Physical Exam: Awake alert oriented right groin with dressing in place no hematoma Left lower extremity dressing in place Brisk capillary refill of the left foot with dorsalis pedis signal traced down to the first web interspace  CBC    Component Value Date/Time   WBC 7.8 05/31/2024 0350   RBC 3.44 (L) 05/31/2024 0350   HGB 8.9 (L) 05/31/2024 0350   HGB 9.4 (L) 05/02/2024 1132   HCT 30.3 (L) 05/31/2024 0350   PLT 179 05/31/2024 0350   PLT 162 05/02/2024 1132   MCV 88.1 05/31/2024 0350   MCH 25.9 (L) 05/31/2024 0350   MCHC 29.4 (L) 05/31/2024 0350   RDW 16.6 (H) 05/31/2024 0350   LYMPHSABS 0.8 05/31/2024 0350   MONOABS 0.5 05/31/2024 0350   EOSABS 0.1 05/31/2024 0350   BASOSABS 0.0 05/31/2024 0350    BMET    Component Value Date/Time   NA 139 05/31/2024 0350   NA 144 08/21/2017 1553   K 4.3 05/31/2024 0350   CL 103 05/31/2024 0350   CO2 26 05/31/2024 0350   GLUCOSE 136 (H) 05/31/2024 0350   BUN 25 (H) 05/31/2024 0350   BUN 21 08/21/2017 1553   CREATININE 1.09 (H) 05/31/2024 0350   CREATININE 0.91 05/02/2024 1132   CALCIUM  8.2 (L) 05/31/2024 0350   CALCIUM  9.6 09/19/2010 2220   GFRNONAA 52 (L) 05/31/2024 0350   GFRNONAA >60 05/02/2024 1132   GFRAA >60 03/29/2020 0314    INR    Component Value Date/Time   INR 1.2 05/26/2024 1730     Intake/Output Summary (Last 24 hours) at 05/31/2024 1012 Last data filed at 05/31/2024 0235 Gross per 24 hour  Intake 1442.4 ml  Output 1300 ml  Net 142.4 ml     Assessment:  78 y.o. female is s/p stent of left common iliac artery for heel ulceration  Plan: She is optimized from vascular standpoint  Patient remains high risk for left lower extremity  amputation given heel ulceration, continue local wound care   Suesan Mohrmann C. Sheree, MD [facility] Office: [phone] Pager: [phone]  05/31/2024 10:12 AM

## 2024-05-31 NOTE — Progress Notes (Signed)
 PHARMACIST LIPID MONITORING  [name] is a 78 y.o. female admitted on 05/26/2024 with L distal femur fracture and L heel ulcer s/p aortogram 7/14.  Pharmacy has been consulted to optimize lipid-lowering therapy with the indication of secondary prevention for clinical ASCVD.  Recent Labs:  Lipid Panel (last 6 months):   Lab Results  Component Value Date   CHOL 77 05/31/2024   TRIG 65 05/31/2024   HDL 23 (L) 05/31/2024   CHOLHDL 3.3 05/31/2024   VLDL 13 05/31/2024   LDLCALC 41 05/31/2024    Hepatic function panel (last 6 months):   Lab Results  Component Value Date   AST 27 05/31/2024   ALT 10 05/31/2024   ALKPHOS 129 (H) 05/31/2024   BILITOT 0.6 05/31/2024    SCr (since admission):   Serum creatinine: 1.09 mg/dL (H) 92/84/74 9649 Estimated creatinine clearance: 38.3 mL/min (A)  Current therapy and lipid therapy tolerance Current lipid-lowering therapy: atorvastatin  40mg  Previous lipid-lowering therapies (if applicable): N/A Documented or reported allergies or intolerances to lipid-lowering therapies (if applicable): None  Assessment:   Spoke with patient regarding lipid therapy and possible increase of atorvastatin  to 80mg . Patient deferred to daughter who manages the patient's meds. Spoke with daughter, who wishes to defer any changes to lipid therapy to outpatient/primary care follow up given LDL is already at goal of < 55.   Plan:   1.Statin intensity (high intensity recommended for all patients regardless of the LDL):  No statin changes. The patient is already on a high intensity statin.  2.Add ezetimibe (if any one of the following):   Not indicated at this time.  3.Refer to lipid clinic:   No  4.Follow-up with:  Primary care provider - Waylan Almarie SAUNDERS, MD  5.Follow-up labs after discharge:  No changes in lipid therapy, repeat a lipid panel in one year.      Maurilio Patten, PharmD PGY1 Pharmacy Resident [facility]  05/31/2024 9:06 AM

## 2024-05-31 NOTE — Consult Note (Signed)
 WOC team consulted for buttocks/genital wounds.   Will request photo documentation of wounds for consult.   Please note that the [facility] nursing team is utilizing a standardized work plan to manage patient consults. We are triaging consults and will try to see the patients within 48 hours. Wound photos in the patient's chart allow us  to consult on the patient in the most efficient and timely manner.    Thanks,   [employer] MSN, RN-BC, [employer]

## 2024-05-31 NOTE — Progress Notes (Signed)

## 2024-05-31 NOTE — Plan of Care (Signed)
   Problem: Activity: Goal: Risk for activity intolerance will decrease Outcome: Not Progressing

## 2024-05-31 NOTE — Progress Notes (Signed)
 Physical Therapy Treatment Patient Details Name: [name] MRN: [id number] DOB: 08-15-1946 Today's Date: 05/31/2024   History of Present Illness 78 y.o female admitted 05/26/24 s/p fall in home with Lt periprosthetic femur fx. 7/11 S/p ORIF. PMH:  HLD, HTN, cervical and rectal CA s/p colostomy, PAD, neuropathy, hypothyroidism, arthritis, DM, CVA    PT Comments  PT attempted initially but required pain meds (10/10) and so returned later in AM, pain at 8/10 after meds administration, agreeable to therapy.  Pt required max A for scooting to EOB, using chux pad for assist.  Pt transferred sit to stand w/ max A of PT in front and SPT into recliner.  Pt remained sitting in recliner w/ LES elevated and all needs in reach.  NT notified of position and need for Purewick placement.    If plan is discharge home, recommend the following: Two people to help with walking and/or transfers;Assistance with cooking/housework;Assist for transportation;Help with stairs or ramp for entrance   Can travel by private vehicle        Equipment Recommendations       Recommendations for Other Services       Precautions / Restrictions Precautions Precautions: Fall Recall of Precautions/Restrictions: Impaired Restrictions Weight Bearing Restrictions Per Provider Order: Yes LLE Weight Bearing Per Provider Order: Weight bearing as tolerated     Mobility  Bed Mobility Overal bed mobility: Needs Assistance Bed Mobility: Supine to Sit     Supine to sit: Max assist     General bed mobility comments: use of chux pad for transfer to EOB    Transfers Overall transfer level: Needs assistance   Transfers: Bed to chair/wheelchair/BSC, Sit to/from Stand Sit to Stand: Max assist     Squat pivot transfers: Max assist              Balance Overall balance assessment: Needs assistance Sitting-balance support: Bilateral upper extremity supported, Feet unsupported, Feet supported Sitting  balance-Leahy Scale: Fair Sitting balance - Comments: lean to Right   Standing balance support: Bilateral upper extremity supported, During functional activity Standing balance-Leahy Scale: Zero Standing balance comment: reliant on external support                            Communication Communication Communication: No apparent difficulties  Cognition Arousal: Alert                                      Cueing Cueing Techniques: Verbal cues  Exercises      General Comments        Pertinent Vitals/Pain Pain Assessment Pain Assessment: 0-10 Pain Score: 8  Pain Location: L hip Pain Descriptors / Indicators: Aching, Grimacing, Guarding, Moaning Pain Intervention(s): Limited activity within patient's tolerance    Home Living                          Prior Function            PT Goals (current goals can now be found in the care plan section) Acute Rehab PT Goals Time For Goal Achievement: 06/12/24 Potential to Achieve Goals: Fair Progress towards PT goals: Progressing toward goals    Frequency           PT Plan      Co-evaluation  AM-PAC PT 6 Clicks Mobility   Outcome Measure  Help needed turning from your back to your side while in a flat bed without using bedrails?: A Lot Help needed moving from lying on your back to sitting on the side of a flat bed without using bedrails?: A Lot Help needed moving to and from a bed to a chair (including a wheelchair)?: A Lot Help needed standing up from a chair using your arms (e.g., wheelchair or bedside chair)?: Total Help needed to walk in hospital room?: Total Help needed climbing 3-5 steps with a railing? : Total 6 Click Score: 9    End of Session Equipment Utilized During Treatment: Gait belt Activity Tolerance: Patient limited by pain;Other (comment) Patient left: with call bell/phone within reach;in chair Nurse Communication: Mobility status PT Visit  Diagnosis: Unsteadiness on feet (R26.81);Other abnormalities of gait and mobility (R26.89);Muscle weakness (generalized) (M62.81);Pain Pain - Right/Left: Left Pain - part of body: Hip     Time: 8861-8843 PT Time Calculation (min) (ACUTE ONLY): 18 min  Charges:    $Therapeutic Activity: 8-22 mins PT General Charges $$ ACUTE PT VISIT: 1 Visit                     Shruti Arrey P. Johnathan Heskett, PT    Asencion Loveday P Yigit Norkus 05/31/2024, 12:02 PM

## 2024-05-31 NOTE — Progress Notes (Signed)
 Inpatient Rehab Admissions Coordinator:    I met with Pt. To discuss potential CIR admit. She's interested and states husband and daughter can provide support. I will open case with insurance  and pursue for admit once rehab md consult is in.   Leita Kleine, MS, CCC-SLP Rehab Admissions Coordinator  [phone] (celll) [phone] (office)

## 2024-06-01 ENCOUNTER — Ambulatory Visit (HOSPITAL_BASED_OUTPATIENT_CLINIC_OR_DEPARTMENT_OTHER): Admitting: General Surgery

## 2024-06-01 DIAGNOSIS — S7292XA Unspecified fracture of left femur, initial encounter for closed fracture: Secondary | ICD-10-CM | POA: Diagnosis not present

## 2024-06-01 LAB — GLUCOSE, CAPILLARY
Glucose-Capillary: 114 mg/dL — ABNORMAL HIGH (ref 70–99)
Glucose-Capillary: 127 mg/dL — ABNORMAL HIGH (ref 70–99)
Glucose-Capillary: 171 mg/dL — ABNORMAL HIGH (ref 70–99)
Glucose-Capillary: 227 mg/dL — ABNORMAL HIGH (ref 70–99)
Glucose-Capillary: 252 mg/dL — ABNORMAL HIGH (ref 70–99)
Glucose-Capillary: 319 mg/dL — ABNORMAL HIGH (ref 70–99)

## 2024-06-01 LAB — CBC WITH DIFFERENTIAL/PLATELET
Abs Immature Granulocytes: 0.04 K/uL (ref 0.00–0.07)
Basophils Absolute: 0 K/uL (ref 0.0–0.1)
Basophils Relative: 0 %
Eosinophils Absolute: 0.1 K/uL (ref 0.0–0.5)
Eosinophils Relative: 2 %
HCT: 32.1 % — ABNORMAL LOW (ref 36.0–46.0)
Hemoglobin: 9.7 g/dL — ABNORMAL LOW (ref 12.0–15.0)
Immature Granulocytes: 1 %
Lymphocytes Relative: 10 %
Lymphs Abs: 0.8 K/uL (ref 0.7–4.0)
MCH: 25.9 pg — ABNORMAL LOW (ref 26.0–34.0)
MCHC: 30.2 g/dL (ref 30.0–36.0)
MCV: 85.8 fL (ref 80.0–100.0)
Monocytes Absolute: 0.6 K/uL (ref 0.1–1.0)
Monocytes Relative: 7 %
Neutro Abs: 6.9 K/uL (ref 1.7–7.7)
Neutrophils Relative %: 80 %
Platelets: 210 K/uL (ref 150–400)
RBC: 3.74 MIL/uL — ABNORMAL LOW (ref 3.87–5.11)
RDW: 16.4 % — ABNORMAL HIGH (ref 11.5–15.5)
WBC: 8.5 K/uL (ref 4.0–10.5)
nRBC: 0 % (ref 0.0–0.2)

## 2024-06-01 LAB — BASIC METABOLIC PANEL WITH GFR
Anion gap: 10 (ref 5–15)
BUN: 18 mg/dL (ref 8–23)
CO2: 26 mmol/L (ref 22–32)
Calcium: 8.6 mg/dL — ABNORMAL LOW (ref 8.9–10.3)
Chloride: 102 mmol/L (ref 98–111)
Creatinine, Ser: 0.82 mg/dL (ref 0.44–1.00)
GFR, Estimated: >60 mL/min (ref >60)
Glucose, Bld: 224 mg/dL — ABNORMAL HIGH (ref 70–99)
Potassium: 4.4 mmol/L (ref 3.5–5.1)
Sodium: 138 mmol/L (ref 135–145)

## 2024-06-01 LAB — MAGNESIUM: Magnesium: 1.5 mg/dL — ABNORMAL LOW (ref 1.7–2.4)

## 2024-06-01 MED ORDER — OXYCODONE HCL ER 20 MG PO T12A
20.0000 mg | EXTENDED_RELEASE_TABLET | Freq: Two times a day (BID) | ORAL | Status: DC
  Administered 2024-06-01 – 2024-06-08 (×14): 20 mg via ORAL
  Filled 2024-06-01 (×14): qty 1

## 2024-06-01 MED ORDER — INSULIN ASPART 100 UNIT/ML IJ SOLN
0.0000 [IU] | Freq: Three times a day (TID) | INTRAMUSCULAR | Status: DC
  Administered 2024-06-01: 15 [IU] via SUBCUTANEOUS
  Administered 2024-06-02: 11 [IU] via SUBCUTANEOUS
  Administered 2024-06-02 (×2): 4 [IU] via SUBCUTANEOUS
  Administered 2024-06-03: 15 [IU] via SUBCUTANEOUS
  Administered 2024-06-03 (×2): 7 [IU] via SUBCUTANEOUS
  Administered 2024-06-04: 4 [IU] via SUBCUTANEOUS
  Administered 2024-06-04 – 2024-06-05 (×5): 7 [IU] via SUBCUTANEOUS
  Administered 2024-06-06: 4 [IU] via SUBCUTANEOUS
  Administered 2024-06-06: 20 [IU] via SUBCUTANEOUS
  Administered 2024-06-06 – 2024-06-07 (×2): 7 [IU] via SUBCUTANEOUS
  Administered 2024-06-07: 20 [IU] via SUBCUTANEOUS
  Administered 2024-06-07 – 2024-06-08 (×3): 7 [IU] via SUBCUTANEOUS

## 2024-06-01 MED ORDER — INSULIN ASPART 100 UNIT/ML IJ SOLN
0.0000 [IU] | Freq: Every day | INTRAMUSCULAR | Status: DC
  Administered 2024-06-01: 3 [IU] via SUBCUTANEOUS
  Administered 2024-06-02: 2 [IU] via SUBCUTANEOUS
  Administered 2024-06-04: 3 [IU] via SUBCUTANEOUS
  Administered 2024-06-05: 2 [IU] via SUBCUTANEOUS

## 2024-06-01 NOTE — Plan of Care (Signed)
 ?  Problem: Clinical Measurements: ?Goal: Will remain free from infection ?Outcome: Progressing ?  ?

## 2024-06-01 NOTE — Progress Notes (Signed)
 MD: can this patient be changed to ACHS instead of q4?   Thanks nursing

## 2024-06-01 NOTE — Progress Notes (Addendum)
 Physical Therapy Treatment Patient Details Name: [name] MRN: [id number] DOB: Aug 30, 1946 Today's Date: 06/01/2024   History of Present Illness 78 y.o female admitted 05/26/24 s/p fall in home with Lt periprosthetic femur fx. 7/11 S/p ORIF.  7/14 s/p stent of left common iliac artery for heel ulceration, still at high risk for LLE limb loss due to wound per vascular MD note. PMH: HLD, HTN, cervical and rectal CA s/p colostomy, PAD, neuropathy, hypothyroidism, arthritis, DM, CVA.    PT Comments  Pt received in supine after premedication, agreeable to therapy session with encouragement, pt anxious at prospect of mobility due to pain. Pt able to perform bed mobility, EOB sitting and transfer from bed to chair with RW with up to +2 maxA. Pt with slightly improved seated/standing tolerance with max multimodal cues and heavy encouragement for pursed-lip breathing as pt perseverating on pain. Pt reports L heel pain is more severe than L hip/knee pain, RN/MD notified in case she could trial a post-op shoe for L heel comfort. Pt continues to benefit from PT services to progress toward functional mobility goals, disposition updated as pt making good progress toward goals and pt/family interested to see if she would qualify for [facility] given overall medical needs and pt not yet safe to transition to home given heavy lift assist, severe pain and wound care needs, discussed with supervising PT Dawn M.    If plan is discharge home, recommend the following: Two people to help with walking and/or transfers;Assistance with cooking/housework;Assist for transportation;Help with stairs or ramp for entrance   Can travel by private vehicle     No  Equipment Recommendations  Wheelchair (measurements PT);Wheelchair cushion (measurements PT);Hoyer lift;Hospital bed    Recommendations for Other Services       Precautions / Restrictions Precautions Precautions: Fall Recall of Precautions/Restrictions:  Impaired Precaution/Restrictions Comments: LLE heel wound, Contact precs - ESBL Restrictions Weight Bearing Restrictions Per Provider Order: Yes LLE Weight Bearing Per Provider Order: Weight bearing as tolerated Other Position/Activity Restrictions: WBAT on the LLE, PTA messaged Vascular surgeon Sheree 7/16 to ask if she needs a post-op shoe given heel wound     Mobility  Bed Mobility Overal bed mobility: Needs Assistance Bed Mobility: Supine to Sit     Supine to sit: +2 for physical assistance, Mod assist, HOB elevated, Used rails     General bed mobility comments: Assist for LLE and trunk and use of bed pads required to advance hips    Transfers Overall transfer level: Needs assistance Equipment used: Rolling walker (2 wheels) Transfers: Sit to/from Stand, Bed to chair/wheelchair/BSC Sit to Stand: Max assist, +2 physical assistance, +2 safety/equipment Stand pivot transfers: Max assist, +2 physical assistance         General transfer comment: Max +2 assist to stand, heavy cues for sequencing and hip extension, cueing for breathing. Pt having difficulty offloading RLE to  step and heavy BUE reliance on RW, pt needs totalA for RW management/proximity and support via gait belt and external assist wrapped around her posterior hips to prevent posterior imbalance during stand pivot.    Ambulation/Gait             Pre-gait activities: working on LLE placement, able to shuffle slightly but not able to fully offload RLE to abduct/extend R foot due to L heel pain; asked MD after session about getting her a post-op shoe if indicated     Stairs  Wheelchair Mobility     Tilt Bed    Modified Rankin (Stroke Patients Only)       Balance Overall balance assessment: Needs assistance Sitting-balance support: Bilateral upper extremity supported, Feet supported Sitting balance-Leahy Scale: Poor Sitting balance - Comments: lean to Right and Poor unsupported  balance due to pain/anxiety   Standing balance support: Bilateral upper extremity supported, During functional activity Standing balance-Leahy Scale: Zero Standing balance comment: reliant on external support and RW                            Communication Communication Communication: No apparent difficulties  Cognition Arousal: Alert Behavior During Therapy: Anxious, WFL for tasks assessed/performed   PT - Cognitive impairments: Attention, Sequencing, Safety/Judgement                       PT - Cognition Comments: Perseverates on pain, max cues for pursed-lip breathing and multimodal cues and increased time needed to process and initiate movements. Following commands: Intact      Cueing Cueing Techniques: Verbal cues, Gestural cues, Tactile cues  Exercises Other Exercises Other Exercises: Supine RLE AROM: heel slide, ankle pumps hip abduction x10 reps ea Other Exercises: LLE AAROM: ankle pumps, hip ADduction x5 reps ea    General Comments General comments (skin integrity, edema, etc.): VSS on RA, pt requiring heavy cues for pursed lip breathing; ostomy appears intact and has stool in it but not full. Pt reports moderate to severe fatigue, 7/10 modified RPE post-exertion.      Pertinent Vitals/Pain Pain Assessment Pain Assessment: Faces Faces Pain Scale: Hurts whole lot Pain Location: Incision site on LLE and L heel where wound is located Pain Descriptors / Indicators: Aching, Grimacing, Guarding, Moaning Pain Intervention(s): Limited activity within patient's tolerance, Monitored during session, Premedicated before session, Repositioned, Ice applied    Home Living                          Prior Function            PT Goals (current goals can now be found in the care plan section) Acute Rehab PT Goals Patient Stated Goal: improve mobility before returning home. PT Goal Formulation: With patient Time For Goal Achievement:  06/12/24 Progress towards PT goals: Progressing toward goals    Frequency    Min 2X/week      PT Plan      Co-evaluation PT/OT/SLP Co-Evaluation/Treatment: Yes Reason for Co-Treatment: For patient/therapist safety;To address functional/ADL transfers PT goals addressed during session: Mobility/safety with mobility;Balance;Proper use of DME;Strengthening/ROM OT goals addressed during session: ADL's and self-care      AM-PAC PT 6 Clicks Mobility   Outcome Measure  Help needed turning from your back to your side while in a flat bed without using bedrails?: A Lot Help needed moving from lying on your back to sitting on the side of a flat bed without using bedrails?: A Lot Help needed moving to and from a bed to a chair (including a wheelchair)?: A Lot Help needed standing up from a chair using your arms (e.g., wheelchair or bedside chair)?: Total Help needed to walk in hospital room?: Total Help needed climbing 3-5 steps with a railing? : Total 6 Click Score: 9    End of Session Equipment Utilized During Treatment: Gait belt Activity Tolerance: Patient limited by pain Patient left: in chair;with call bell/phone within reach;with chair  alarm set;Other (comment) (heels floated in recliner, ice to LLE for pain (around knee/lower thigh)) Nurse Communication: Mobility status;Need for lift equipment;Patient requests pain meds;Other (comment) (could she get a LLE post-op shoe?) PT Visit Diagnosis: Unsteadiness on feet (R26.81);Other abnormalities of gait and mobility (R26.89);Muscle weakness (generalized) (M62.81);Pain Pain - Right/Left: Left Pain - part of body: Hip;Ankle and joints of foot     Time: 1012-1043 PT Time Calculation (min) (ACUTE ONLY): 31 min  Charges:    $Therapeutic Activity: 8-22 mins PT General Charges $$ ACUTE PT VISIT: 1 Visit                     Janisse Ghan P., PTA Acute Rehabilitation Services Secure Chat Preferred 9a-5:30pm Office: [phone]     Connell HERO [facility] 06/01/2024, 1:04 PM

## 2024-06-01 NOTE — Consult Note (Signed)
 WOC Nurse Consult Note: Reason for Consult:Genital/Buttock Wounds  Wound type:  WTA assessed patients genital and buttocks area, noted skin is dry and intact, unable to locate any wounds at this time.  Met with bedside RN and completed a second assessment with bedside RN present, no wounds noted.  Noted patient has a LLQ colostomy, patient reports this is managed by her and her daughter at home prior to admission, current ostomy appliance is on, clean and intact.  Will need the following supplies for management: 2 piece soft convex skin barrier (lawson [phone]) ostomy pouch (lawson [phone]) and barrier ring (lawson [phone]).  WOC team will not follow at this time, please re-consult if new needs arise.  Thank you,  Doyal Polite, RN, MSN, [facility] WOC Team

## 2024-06-01 NOTE — TOC Initial Note (Signed)
 Transition of Care (TOC) - Initial/Assessment Note  Rayfield Gobble RN, BSN Inpatient Care Management Unit 4E- RN Case Manager See Treatment Team for direct phone #   Patient Details  Name: [name] MRN: [id number] Date of Birth: 1946/03/21  Transition of Care [facility]) CM/SW Contact:    Gobble Rayfield Hurst, RN Phone Number: 06/01/2024, 2:37 PM  Clinical Narrative:                 Msg received from [facility] INPT rehab liaison that pt is not at level to tolerate CIR at this time, daughter requesting LTACH- [facility].   CM in to speak with pt and daughter at bedside. Discussed different levels of care for rehab. Daughter voiced she really wants pt to go to [facility] and asking for referral. Explained that [facility] would have to review and seek insurance approval in order for pt to go to [facility]. Daughter and pt voiced understanding. Also discussed with them to think of backup plan if LTACH not approved- discussed ST-SNF vs Home w/ HH.   CM contacted [facility] liaison who will review and come speak with pt and daughter.   1430- [facility] to start insurance auth per pt/daughter request.   Expected Discharge Plan: Long Term Acute Care (LTAC) Barriers to Discharge: Insurance Authorization   Patient Goals and CMS Choice Patient states their goals for this hospitalization and ongoing recovery are:: wants to get better and recover   Choice offered to / list presented to : Patient, Adult Children      Expected Discharge Plan and Services   Discharge Planning Services: CM Consult Post Acute Care Choice: Long Term Acute Care (LTAC) Living arrangements for the past 2 months: Single Family Home                             HH Agency: [facility]        Prior Living Arrangements/Services Living arrangements for the past 2 months: Single Family Home Lives with:: Self              Current home services: DME    Activities of Daily Living   ADL Screening (condition at time of  admission) Independently performs ADLs?: Yes (appropriate for developmental age) Is the patient deaf or have difficulty hearing?: Yes Does the patient have difficulty seeing, even when wearing glasses/contacts?: Yes Does the patient have difficulty concentrating, remembering, or making decisions?: Yes  Permission Sought/Granted   Permission granted to share information with : Yes, Verbal Permission Granted     Permission granted to share info w AGENCY: [facility]        Emotional Assessment Appearance:: Appears stated age Attitude/Demeanor/Rapport: Engaged Affect (typically observed): Accepting Orientation: : Oriented to Self, Oriented to Place, Oriented to  Time, Oriented to Situation Alcohol / Substance Use: Not Applicable Psych Involvement: No (comment)  Admission diagnosis:  Femur fracture, left (HCC) [S72.92XA] Patient Active Problem List   Diagnosis Date Noted   Femur fracture, left (HCC) 05/26/2024   Fall at home, initial encounter 05/26/2024   Cellulitis of both lower extremities 05/26/2024   Multiple drug resistant organism (MDRO) culture positive 10/19/2023   PUD (peptic ulcer disease) 10/15/2023   Coffee ground emesis 10/14/2023   Upper abdominal pain, unspecified 10/14/2023   Acute UTI 10/14/2023   Gastrointestinal hemorrhage 10/13/2023   Upper GI bleed 10/12/2023   Opioid dependence (HCC) 09/12/2023   Chronic diastolic CHF (congestive heart failure) (HCC) 09/10/2023  Pain in joint of right knee 09/01/2023   Parastomal hernia without obstruction or gangrene 04/27/2023   Colostomy complication (HCC) 04/27/2023   Ear stuffiness, right 11/24/2022   PAD (peripheral artery disease) (HCC) 11/30/2021   Obesity (BMI 30-39.9) 11/30/2021   Acute respiratory failure with hypoxia (HCC) 11/29/2021   Sepsis due to pneumonia (HCC) 11/29/2021   Severe protein-calorie malnutrition (HCC) 11/29/2021   Acute respiratory disease due to COVID-19 virus 11/29/2021   Duodenal ulcer     Acute gastric ulcer with hemorrhage    Abnormal CT of the abdomen    Malnutrition of moderate degree 10/09/2021   Sepsis (HCC) 10/08/2021   Symptomatic anemia 09/23/2021   Pressure injury of skin 08/26/2021   Sepsis secondary to UTI (HCC) 08/25/2021   Goals of care, counseling/discussion 07/01/2021   Coronary artery disease 03/13/2021   Cellulitis of right lower limb 02/22/2021   Malignant neoplasm of overlapping sites of cervix (HCC) 02/18/2021   Rectal cancer (HCC) 01/29/2021   Rectal mass    Hydronephrosis    Iron deficiency anemia due to chronic blood loss    Non-intractable vomiting    Lower abdominal pain    UTI (urinary tract infection) 01/18/2021   Peripheral nerve disease 09/07/2020   Vitamin D  deficiency 09/07/2020   Carotid artery stenosis, symptomatic, right 03/28/2020   Preoperative clearance 03/21/2020   History of COVID-19 02/22/2020   Carotid artery disease (HCC) 02/22/2020   History of CVA (cerebrovascular accident) 02/11/2020   Macrocytic anemia 11/29/2019   Hypokalemia 07/27/2019   Hypomagnesemia 07/27/2019   History of cervical cancer 03/18/2019   DMII (diabetes mellitus, type 2) (HCC) 05/04/2018   Essential hypertension 05/04/2018   Bilateral lower extremity edema 07/31/2017   Hypothyroidism 06/06/2010   Hyperlipidemia associated with type 2 diabetes mellitus (HCC) 10/20/2008   PCP:  Waylan Almarie SAUNDERS, MD Pharmacy:   [facility] [address], [place] - [address] [place] [address] Phone: [phone] Fax: [phone]     Social Drivers of Health (SDOH) Social History: SDOH Screenings   Food Insecurity: No Food Insecurity (05/27/2024)  Housing: Low Risk  (05/27/2024)  Transportation Needs: No Transportation Needs (05/27/2024)  Utilities: Not At Risk (05/27/2024)  Depression (PHQ2-9): Low Risk  (01/17/2020)  Social Connections: Moderately Integrated (05/27/2024)  Tobacco Use: Medium Risk (05/27/2024)    SDOH Interventions:     Readmission Risk Interventions    10/15/2023    9:49 AM 09/11/2023   11:36 AM 12/02/2021    1:55 PM  Readmission Risk Prevention Plan  Transportation Screening Complete Complete Complete  PCP or Specialist Appt within 3-5 Days  Complete   HRI or Home Care Consult  Complete   Social Work Consult for Recovery Care Planning/Counseling  Complete   Palliative Care Screening  Not Applicable   Medication Review Oceanographer) Referral to Pharmacy Complete Complete  PCP or Specialist appointment within 3-5 days of discharge Complete  Complete  HRI or Home Care Consult Complete  Complete  SW Recovery Care/Counseling Consult Complete  Complete  Palliative Care Screening Not Applicable  Not Applicable  Skilled Nursing Facility Not Applicable  Patient Refused

## 2024-06-01 NOTE — Progress Notes (Signed)
 Inpatient Rehab Admissions Coordinator:     PT/OT now feel Pt. Likely could not tolerate CIR. CIR will sign off. Daughter notified and she does not want SNF, prefers referral to [facility], I let TOC know.   Leita Kleine, MS, CCC-SLP Rehab Admissions Coordinator  [phone] (celll) [phone] (office)

## 2024-06-01 NOTE — Progress Notes (Signed)
 PROGRESS NOTE    [name]  FMW:[id number] DOB: 1945-12-04 DOA: 05/26/2024 PCP: Waylan Almarie SAUNDERS, MD   Brief Narrative:  [name] is a 78 y.o. female with medical history significant for T2DM, HLD, HTN, cervical and rectal cancer s/p colostomy, PAD, diabetic foot and heel ulcer,, neuropathy, hypothyroidism, hypomagnesemia, anemia and arthritis who presented to the ED after a fall at home and found to have a moderately angulated midshaft fracture of the left femur.     At baseline, patient ambulates with a walker however recently, she has been losing strength in her legs. Patient has chronic lower extremity swelling and neuropathy but has had recent increased redness to her legs, L>R.  She she has had poor appetite but denies any dizziness, recent illness, fevers, chills, nausea or vomiting. She was recently treated for possible UTI about a week ago.   **Interim History: She was taken for surgical intervention and postoperatively she was very somnolent and had a new oxygen requirement.  We transferred her to the PCU and she was weaned off but then had to be placed back on Supplemental O2 via [place] when she was sleeping.  Has a diabetic foot ulcer and Underwent aortogram of the Bilaterl LE and had a Left Leg Run-off on 05/30/24. She had a L common iliac artery stent placed.  PT OT recommended CIR but per CIR, she is not candidate for CIR, SNF was recommended but family does not want SNF, now referral has been sent to LTAC.  Assessment & Plan:   Principal Problem:   Femur fracture, left (HCC) Active Problems:   Fall at home, initial encounter   Cellulitis of both lower extremities  Mechanical fall and  Left femur fracture s/p Fall:  Orthopedic surgery consulted and she is s/p ORIF now. Ortho recommending weightbearing as tolerated and reinforce the dressings as needed.  They have initiated on enoxaparin  for VTE prophylaxis.  PT OT recommended CIR but per CIR, she is not a candidate  for them and they recommended SNF but family wants LTAC.  Referral made.  TOC working on placement.   Lower Extremity Cellulitis: Patient with chronic lower extremity edema found to have mild cellulitis of the lower extremities but now has surgical intervention and Leg is wrapper; C/w IV Cefazolin  1 gram po q8h for now with expected stop date 7/18; CTM and Trend CBC, fever curve   Acute Respiratory Failure w/ Hypoxia: Post Op and ? Related to Anesthesia however was hypoxic overnight few nights ago and had to be placed back on Supplemental O2.  Has been weaned to room air and she is comfortable.  Could have been due to atelectasis.   Type 2 Diabetes Mellitus complicated by Neuropathy:  Last A1c 6.5% 3 months ago.  PTA medications metformin , which is on hold.  Currently on every 4 hours SSI but patient is on diet.  Will transition to ACHS SSI.  Blood sugar labile ranging anywhere from 89-2 27.   Diabetic Foot Ulcer / Nonhealing pressure ulcer/chronic foot pain:  Patient with full-thickness ulcer at the posterior left heel with large eschar (see media tab);  No significant improvement in left heel ulcer over the last few weeks.  Followed by podiatry and wound care. Wound care consulted, appreciate recs; See below for LLE Common iliac Intervention. Will likely need Podiatry/Orth to evaluate for debridement once she heals from her Hip Sx. The C is recommending IR team providing the patient a healing shoe from the [employer] and has  been placed orders.  Although patient still complains of 9 out of 10 pain but she appears to be very comfortable.  She has received Dilaudid  IV only once in last 24 hours.  Currently she is on long-acting oxycodone  50 mg twice daily with short acting oxycodone  every 6 hours 2.5 mg - 5 mg as needed.  I will discontinue IV Dilaudid , increase oxycodone  long-acting to 20 mg p.o. daily and continue short acting oxycodone .   PAD: ABIs 02/16/2024 are 0.85 on the right biphasic and 1.01 on the  left biphasic.  Follows vascular surgery as outpatient.  Vascular surgery consulted by Ortho in regards to possible intervention during this hospitalization; Underwent Aortogram/Arteriogram of Bilaterl LE and had stenting of the L Common iliac Artery and was given Plavix  load; Now on Clopidogrel  75 mg po daily w/ Breakfast -Continue Atorvastatin  as below   Hydronephrosis: Improving CT A/P 5 days ago showed bilateral hydronephrosis, R>L but no visible obstructing stone; Hydronephrosis thought to be due to patient's rectal cancer. Checked Renal U/S and showed  Mild right hydronephrosis which hasimproved since prior CT examination and Interval resolution of left Hydronephrosis. There was also Moderate right and mild left renal cortical atrophy. Will hold off Urological Consult given her improvement    Chronic Hypomagnesemia:  Unclear etiology, receives intermittent IV magnesium  by cancer center. Magnesium  Level was 1.6 yesterday which was replenished, rechecking today.   Essential HTN:  BP very well-controlled, remains on IV as needed hydralazine .   Cervical Cancer / Rectal cancer s/p colostomy: Followed closely with Oncology, patient has declined further chemotherapy or surgery for removal of the remaining tumor.  Continue Vitamin Supplementation   Normocytic Anemia / IDA:  Baseline 8-9; workup indicates iron deficiency anemia.  Continue iron supplementation w/ Ferrous Sulfate  325 mg po MWF.    Chronic BLE edema: Thought to be due to lymphedema;  Apply TED hose however her left leg is now wrapped from her surgery   HLD: C/w Atorvastatin  40 mg.   Hypothyroidism: C/w Levothyroxine  137 mcg po Daily    GERD/GI Prophylaxis: Continue home PPI w/ Pantoprazole  40 mg po Daily   Mood Disorder:  Continue Sertraline  150 mg po Daily   Constipation: Resolved.  Continue bowel regimen.   Perineal/groin and Buttock Wounds: WOC Nurse Consult as they are bleeding some   Class I Obesity: Complicates overall  prognosis and care. Estimated body mass index is 32.12 kg/m as calculated from the following:   Height as of this encounter: 5' (1.524 m).   Weight as of this encounter: 74.6 kg. Weight Loss and Dietary Counseling given   DVT prophylaxis: Place TED hose Start: 05/26/24 2205 SCDs Start: 05/26/24 1943   Code Status: Full Code  Family Communication: Daughter present at bedside.  Plan of care discussed with patient in length and he/she verbalized understanding and agreed with it.  Status is: Inpatient Remains inpatient appropriate because: Medically stable, pending placement for   Estimated body mass index is 32.12 kg/m as calculated from the following:   Height as of this encounter: 5' (1.524 m).   Weight as of this encounter: 74.6 kg.    Nutritional Assessment: Body mass index is 32.12 kg/m.[name] Seen by dietician.  I agree with the assessment and plan as outlined below: Nutrition Status:        . Skin Assessment: I have examined the patient's skin and I agree with the wound assessment as performed by the wound care RN as outlined below:    Consultants:  Vascular surgery and orthopedics  Procedures:  As above  Antimicrobials:  Anti-infectives (From admission, onward)    Start     Dose/Rate Route Frequency Ordered Stop   05/27/24 1410  vancomycin  (VANCOCIN ) powder  Status:  Discontinued          As needed 05/27/24 1410 05/27/24 1528   05/26/24 2245  ceFAZolin  (ANCEF ) IVPB 1 g/50 mL premix        1 g 100 mL/hr over 30 Minutes Intravenous Every 8 hours 05/26/24 2150           Subjective: Patient seen and examined, complains of 9 out of 10 pain in the right foot, daughter at the bedside.  Despite of this much of pain as she complains, she appears to be extremely comfortable.  Objective: Vitals:   05/31/24 2359 06/01/24 0353 06/01/24 0808 06/01/24 1214  BP: (!) 130/43 (!) 135/48 (!) 129/57 (!) 139/45  Pulse: 71 65 62 73  Resp: 18 17 18 11   Temp: 98.7 F (37.1 C)  97.9 F (36.6 C) 97.9 F (36.6 C) 98.3 F (36.8 C)  TempSrc: Oral Oral Oral Oral  SpO2: 97% 95% 97% 97%  Weight:      Height:        Intake/Output Summary (Last 24 hours) at 06/01/2024 1304 Last data filed at 06/01/2024 0552 Gross per 24 hour  Intake 150 ml  Output 1500 ml  Net -1350 ml   Filed Weights   05/27/24 1130 05/28/24 0353 05/30/24 0352  Weight: 70 kg 73.9 kg 74.6 kg    Examination:  General exam: Appears calm and comfortable, obese Respiratory system: Clear to auscultation. Respiratory effort normal. Cardiovascular system: S1 & S2 heard, RRR. No JVD, murmurs, rubs, gallops or clicks. No pedal edema. Gastrointestinal system: Abdomen is nondistended, soft and nontender. No organomegaly or masses felt. Normal bowel sounds heard. Central nervous system: Alert and oriented. No focal neurological deficits. Extremities: Left lower extremity wrapped in the dressing. Psychiatry: Judgement and insight appear normal. Mood & affect appropriate.    Data Reviewed: I have personally reviewed following labs and imaging studies  CBC: Recent Labs  Lab 05/28/24 0300 05/29/24 1146 05/30/24 0330 05/31/24 0350 06/01/24 0916  WBC 9.0 7.5 7.2 7.8 8.5  NEUTROABS 7.9* 6.3 5.6 6.3 6.9  HGB 10.0* 10.6* 9.7* 8.9* 9.7*  HCT 31.8* 34.7* 32.0* 30.3* 32.1*  MCV 84.8 87.2 86.5 88.1 85.8  PLT 149* 155 155 179 210   Basic Metabolic Panel: Recent Labs  Lab 05/27/24 0631 05/28/24 0300 05/29/24 1146 05/30/24 0330 05/31/24 0350 06/01/24 0916  NA 142 139 139 139 139 138  K 3.7 3.8 4.6 4.3 4.3 4.4  CL 108 105 103 104 103 102  CO2 25 25 23 27 26 26   GLUCOSE 121* 276* 247* 147* 136* 224*  BUN 13 16 19 20  25* 18  CREATININE 0.92 1.22* 0.94 0.85 1.09* 0.82  CALCIUM  8.0* 7.8* 8.3* 8.3* 8.2* 8.6*  MG 2.2 1.5* 1.3* 1.8 1.6*  --   PHOS  --  2.9 2.6 3.2 3.6  --    GFR: Estimated Creatinine Clearance: 51 mL/min (by C-G formula based on SCr of 0.82 mg/dL). Liver Function Tests: Recent  Labs  Lab 05/27/24 0631 05/28/24 0300 05/29/24 1146 05/30/24 0330 05/31/24 0350  AST 14* 24 22 21 27   ALT 15 14 10 10 10   ALKPHOS 79 79 106 103 129*  BILITOT 0.5 0.4 0.6 0.6 0.6  PROT 5.0* 4.7* 5.3* 4.9* 4.7*  ALBUMIN 2.1* 2.0*  2.1* 1.9* 1.7*   No results for input(s): LIPASE, AMYLASE in the last 168 hours. No results for input(s): AMMONIA in the last 168 hours. Coagulation Profile: Recent Labs  Lab 05/26/24 1730  INR 1.2   Cardiac Enzymes: No results for input(s): CKTOTAL, CKMB, CKMBINDEX, TROPONINI in the last 168 hours. BNP (last 3 results) No results for input(s): PROBNP in the last 8760 hours. HbA1C: No results for input(s): HGBA1C in the last 72 hours. CBG: Recent Labs  Lab 05/31/24 1928 06/01/24 0001 06/01/24 0357 06/01/24 0807 06/01/24 1213  GLUCAP 89 171* 127* 114* 227*   Lipid Profile: Recent Labs    05/31/24 0350  CHOL 77  HDL 23*  LDLCALC 41  TRIG 65  CHOLHDL 3.3   Thyroid  Function Tests: No results for input(s): TSH, T4TOTAL, FREET4, T3FREE, THYROIDAB in the last 72 hours. Anemia Panel: No results for input(s): VITAMINB12, FOLATE, FERRITIN, TIBC, IRON, RETICCTPCT in the last 72 hours. Sepsis Labs: No results for input(s): PROCALCITON, LATICACIDVEN in the last 168 hours.  Recent Results (from the past 240 hours)  Surgical pcr screen     Status: None   Collection Time: 05/27/24 11:08 AM   Specimen: Nasal Mucosa; Nasal Swab  Result Value Ref Range Status   MRSA, PCR NEGATIVE NEGATIVE Final   Staphylococcus aureus NEGATIVE NEGATIVE Final    Comment: (NOTE) The Xpert SA Assay (FDA approved for NASAL specimens in patients 61 years of age and older), is one component of a comprehensive surveillance program. It is not intended to diagnose infection nor to guide or monitor treatment. Performed at [facility], [address] [place], [place] [address]      Radiology Studies: No results  found.  Scheduled Meds:  atorvastatin   40 mg Oral QHS   baclofen   10 mg Oral BID   Chlorhexidine  Gluconate Cloth  6 each Topical Daily   cholecalciferol   2,000 Units Oral Daily   clopidogrel   75 mg Oral Q breakfast   clotrimazole    Topical Daily   cyanocobalamin   1,000 mcg Oral Daily   feeding supplement  237 mL Oral TID BM   ferrous sulfate   325 mg Oral Q M,W,F   gabapentin   300 mg Oral QHS   gabapentin   600 mg Oral Daily   guaiFENesin   1,200 mg Oral BID   insulin  aspart  0-20 Units Subcutaneous TID WC   insulin  aspart  0-5 Units Subcutaneous QHS   leptospermum manuka honey  1 Application Topical Daily   levothyroxine   137 mcg Oral Q0600   multivitamin with minerals  1 tablet Oral Q breakfast   oxyCODONE   20 mg Oral Q12H   pantoprazole   40 mg Oral QAC breakfast   polyethylene glycol  17 g Oral BID   sertraline   150 mg Oral QHS   sodium chloride  flush  3 mL Intravenous Q12H   Continuous Infusions:   ceFAZolin  (ANCEF ) IV 1 g (06/01/24 0535)     LOS: 6 days   Fredia Skeeter, MD [facility]  06/01/2024, 1:04 PM   *Please note that this is a verbal dictation therefore any spelling or grammatical errors are due to the Dragon Medical One system interpretation.  Please page via Amion and do not message via secure chat for urgent patient care matters. Secure chat can be used for non urgent patient care matters.  How to contact the [facility] Attending or Consulting provider 7A - 7P or covering provider during after hours 7P -7A, for this patient?  Check the care team in [facility] and look for a) attending/consulting TR[facility] provider listed and b) the [facility] team listed. Page or secure chat 7A-7P. Log into [email or web address] and use [place]'s universal password to access. If you do not have the password, please contact the hospital operator. Locate the [facility] provider you are looking for under [facility] and page to a number that you can be directly reached. If you still have difficulty  reaching the provider, please page the [facility] (Director on Call) for the Hospitalists listed on amion for assistance.

## 2024-06-01 NOTE — Progress Notes (Signed)
 Occupational Therapy Treatment Patient Details Name: [name] MRN: [id number] DOB: Apr 05, 1946 Today's Date: 06/01/2024   History of present illness 78 y.o female admitted 05/26/24 s/p fall in home with Lt periprosthetic femur fx. 7/11 S/p ORIF. PMH:  HLD, HTN, cervical and rectal CA s/p colostomy, PAD, neuropathy, hypothyroidism, arthritis, DM, CVA   OT comments  Pt progressing towards goals. Progressed to complete transfers with stand pivot and use of RW. Still requiring max +2 assistance for stand and transfer. Pain in LLE heel appears to be limiting pt. Per consult with CIR, pt likely unable to tolerate intensive therapy. Per family request consult for long-term acute care rehab consult has been placed. Will continue to follow acutely.      If plan is discharge home, recommend the following:  Two people to help with walking and/or transfers;Two people to help with bathing/dressing/bathroom;Assistance with cooking/housework   Equipment Recommendations  Other (comment) (Defer to next venue)       Precautions / Restrictions Precautions Precautions: Fall Recall of Precautions/Restrictions: Impaired Restrictions Weight Bearing Restrictions Per Provider Order: Yes LLE Weight Bearing Per Provider Order: Weight bearing as tolerated       Mobility Bed Mobility Overal bed mobility: Needs Assistance Bed Mobility: Supine to Sit     Supine to sit: +2 for physical assistance, Mod assist     General bed mobility comments: Assist for LLE and trunk    Transfers Overall transfer level: Needs assistance Equipment used: Rolling walker (2 wheels) Transfers: Sit to/from Stand, Bed to chair/wheelchair/BSC Sit to Stand: Max assist, +2 physical assistance, +2 safety/equipment Stand pivot transfers: Max assist, +2 physical assistance         General transfer comment: Max +2 assist to stand, heavy cues for sequencing and hip extension, cueing for breathing     Balance Overall  balance assessment: Needs assistance Sitting-balance support: Bilateral upper extremity supported, Feet supported Sitting balance-Leahy Scale: Fair     Standing balance support: Bilateral upper extremity supported, During functional activity Standing balance-Leahy Scale: Zero Standing balance comment: reliant on external support     ADL either performed or assessed with clinical judgement   ADL Overall ADL's : Needs assistance/impaired     Toilet Transfer: Maximal assistance;+2 for safety/equipment;+2 for physical assistance;Rolling walker (2 wheels) Toilet Transfer Details (indicate cue type and reason): Simulated in room +2, heavy cueing for RW     General ADL Comments: Pt limited by pain and anxiety    Extremity/Trunk Assessment Upper Extremity Assessment Upper Extremity Assessment: Generalized weakness   Lower Extremity Assessment Lower Extremity Assessment: Defer to PT evaluation        Vision   Vision Assessment?: No apparent visual deficits         Communication Communication Communication: No apparent difficulties   Cognition Arousal: Alert Behavior During Therapy: Anxious, WFL for tasks assessed/performed Cognition: No apparent impairments   Following commands: Intact        Cueing   Cueing Techniques: Verbal cues        General Comments VSS on RA, pt requirinfg heavy cues for pursed lip breathing    Pertinent Vitals/ Pain       Pain Assessment Pain Assessment: Faces Faces Pain Scale: Hurts whole lot Pain Location: Incision site and heel Pain Descriptors / Indicators: Aching, Grimacing, Guarding, Moaning Pain Intervention(s): Limited activity within patient's tolerance   Frequency  Min 2X/week        Progress Toward Goals  OT Goals(current goals can now be found in  the care plan section)  Progress towards OT goals: Progressing toward goals  Acute Rehab OT Goals Patient Stated Goal: To get better OT Goal Formulation: With  patient Time For Goal Achievement: 06/12/24 Potential to Achieve Goals: Good ADL Goals Pt Will Perform Lower Body Dressing: with min assist;sit to/from stand Pt Will Transfer to Toilet: with mod assist;stand pivot transfer;bedside commode Pt Will Perform Toileting - Clothing Manipulation and hygiene: with mod assist;sit to/from stand;sitting/lateral leans  Plan      Co-evaluation    PT/OT/SLP Co-Evaluation/Treatment: Yes Reason for Co-Treatment: For patient/therapist safety;To address functional/ADL transfers   OT goals addressed during session: ADL's and self-care      AM-PAC OT 6 Clicks Daily Activity     Outcome Measure   Help from another person eating meals?: None Help from another person taking care of personal grooming?: A Little Help from another person toileting, which includes using toliet, bedpan, or urinal?: Total Help from another person bathing (including washing, rinsing, drying)?: A Lot Help from another person to put on and taking off regular upper body clothing?: A Little Help from another person to put on and taking off regular lower body clothing?: A Lot 6 Click Score: 15    End of Session Equipment Utilized During Treatment: Gait belt;Rolling walker (2 wheels)  OT Visit Diagnosis: Other abnormalities of gait and mobility (R26.89);Unsteadiness on feet (R26.81);Repeated falls (R29.6);Muscle weakness (generalized) (M62.81)   Activity Tolerance Patient limited by pain   Patient Left in chair;with call bell/phone within reach;with chair alarm set;with nursing/sitter in room   Nurse Communication Mobility status;Need for lift equipment        Time: 1010-1044 OT Time Calculation (min): 34 min  Charges: OT General Charges $OT Visit: 1 Visit OT Treatments $Self Care/Home Management : 8-22 mins  Adrianne BROCKS, OT  Acute Rehabilitation Services Office [phone] Secure chat preferred   Adrianne GORMAN Savers 06/01/2024, 11:45 AM

## 2024-06-02 ENCOUNTER — Other Ambulatory Visit

## 2024-06-02 ENCOUNTER — Ambulatory Visit: Admitting: Oncology

## 2024-06-02 ENCOUNTER — Telehealth: Payer: Self-pay | Admitting: Oncology

## 2024-06-02 ENCOUNTER — Encounter: Payer: Self-pay | Admitting: *Deleted

## 2024-06-02 DIAGNOSIS — S7292XA Unspecified fracture of left femur, initial encounter for closed fracture: Secondary | ICD-10-CM | POA: Diagnosis not present

## 2024-06-02 LAB — CBC WITH DIFFERENTIAL/PLATELET
Abs Immature Granulocytes: 0.03 K/uL (ref 0.00–0.07)
Basophils Absolute: 0 K/uL (ref 0.0–0.1)
Basophils Relative: 0 %
Eosinophils Absolute: 0.1 K/uL (ref 0.0–0.5)
Eosinophils Relative: 2 %
HCT: 28 % — ABNORMAL LOW (ref 36.0–46.0)
Hemoglobin: 8.4 g/dL — ABNORMAL LOW (ref 12.0–15.0)
Immature Granulocytes: 1 %
Lymphocytes Relative: 14 %
Lymphs Abs: 0.9 K/uL (ref 0.7–4.0)
MCH: 26.1 pg (ref 26.0–34.0)
MCHC: 30 g/dL (ref 30.0–36.0)
MCV: 87 fL (ref 80.0–100.0)
Monocytes Absolute: 0.5 K/uL (ref 0.1–1.0)
Monocytes Relative: 8 %
Neutro Abs: 4.7 K/uL (ref 1.7–7.7)
Neutrophils Relative %: 75 %
Platelets: 189 K/uL (ref 150–400)
RBC: 3.22 MIL/uL — ABNORMAL LOW (ref 3.87–5.11)
RDW: 16.2 % — ABNORMAL HIGH (ref 11.5–15.5)
WBC: 6.3 K/uL (ref 4.0–10.5)
nRBC: 0 % (ref 0.0–0.2)

## 2024-06-02 LAB — GLUCOSE, CAPILLARY
Glucose-Capillary: 170 mg/dL — ABNORMAL HIGH (ref 70–99)
Glucose-Capillary: 160 mg/dL — ABNORMAL HIGH (ref 70–99)
Glucose-Capillary: 255 mg/dL — ABNORMAL HIGH (ref 70–99)
Glucose-Capillary: 203 mg/dL — ABNORMAL HIGH (ref 70–99)

## 2024-06-02 LAB — BASIC METABOLIC PANEL WITH GFR
Anion gap: 8 (ref 5–15)
BUN: 19 mg/dL (ref 8–23)
CO2: 28 mmol/L (ref 22–32)
Calcium: 8.3 mg/dL — ABNORMAL LOW (ref 8.9–10.3)
Chloride: 102 mmol/L (ref 98–111)
Creatinine, Ser: 0.8 mg/dL (ref 0.44–1.00)
GFR, Estimated: >60 mL/min (ref >60)
Glucose, Bld: 181 mg/dL — ABNORMAL HIGH (ref 70–99)
Potassium: 4.4 mmol/L (ref 3.5–5.1)
Sodium: 138 mmol/L (ref 135–145)

## 2024-06-02 MED ORDER — MAGNESIUM SULFATE 4 GM/100ML IV SOLN
4.0000 g | Freq: Once | INTRAVENOUS | Status: AC
  Administered 2024-06-02: 4 g via INTRAVENOUS
  Filled 2024-06-02: qty 100

## 2024-06-02 NOTE — Plan of Care (Signed)
  Problem: Clinical Measurements: Goal: Will remain free from infection Outcome: Progressing   Problem: Activity: Goal: Risk for activity intolerance will decrease Outcome: Not Progressing

## 2024-06-02 NOTE — TOC Progression Note (Signed)
 Transition of Care (TOC) - Progression Note  Rayfield Gobble RN, BSN Inpatient Care Management Unit 4E- RN Case Manager See Treatment Team for direct phone #   Patient Details  Name: LARNA CAPELLE MRN: [id number] Date of Birth: 12-Dec-1945  Transition of Care [facility]) CM/SW Contact  Gobble, Rayfield Hurst, RN Phone Number: 06/02/2024, 3:45 PM  Clinical Narrative:    Received msg from bedside RN that daughter [name] requesting call to discuss placement.   TC made to daughter [name]- per conversation discussed that team feels that insurance is not likely to approve LTACH/[facility]- however [facility] has not heard anything as of yet.  Discussed pt progression well and offered alternatives again for SNF vs HH. Will also have therapies re-assess to see if they feel pt can tolerate CIR level now that pain is better controled.  Daughter agreeable to look at SNF as a backup- requesting bed search for [place] area[place] close to home.  Msg sent to CSW regarding need for SNF workup.   TOC will follow up tomorrow to see how pt does with therapies.    Expected Discharge Plan: Long Term Acute Care (LTAC) Barriers to Discharge: Insurance Authorization  Expected Discharge Plan and Services   Discharge Planning Services: CM Consult Post Acute Care Choice: Long Term Acute Care (LTAC) Living arrangements for the past 2 months: Single Family Home                             HH Agency: [facility]         Social Determinants of Health (SDOH) Interventions SDOH Screenings   Food Insecurity: No Food Insecurity (05/27/2024)  Housing: Low Risk  (05/27/2024)  Transportation Needs: No Transportation Needs (05/27/2024)  Utilities: Not At Risk (05/27/2024)  Depression (PHQ2-9): Low Risk  (01/17/2020)  Social Connections: Moderately Integrated (05/27/2024)  Tobacco Use: Medium Risk (05/27/2024)    Readmission Risk Interventions    10/15/2023    9:49 AM 09/11/2023   11:36 AM 12/02/2021     1:55 PM  Readmission Risk Prevention Plan  Transportation Screening Complete Complete Complete  PCP or Specialist Appt within 3-5 Days  Complete   HRI or Home Care Consult  Complete   Social Work Consult for Recovery Care Planning/Counseling  Complete   Palliative Care Screening  Not Applicable   Medication Review Oceanographer) Referral to Pharmacy Complete Complete  PCP or Specialist appointment within 3-5 days of discharge Complete  Complete  HRI or Home Care Consult Complete  Complete  SW Recovery Care/Counseling Consult Complete  Complete  Palliative Care Screening Not Applicable  Not Applicable  Skilled Nursing Facility Not Applicable  Patient Refused

## 2024-06-02 NOTE — Progress Notes (Signed)
 Per Dr. Cloretta: Left scheduling message for lab/flush/OV in 3-4 weeks.

## 2024-06-02 NOTE — Progress Notes (Signed)
 PROGRESS NOTE    SHELL BLANCHETTE  FMW:[id number] DOB: 1946/01/29 DOA: 05/26/2024 PCP: Waylan Almarie SAUNDERS, MD   Brief Narrative:  [name] is a 78 y.o. female with medical history significant for T2DM, HLD, HTN, cervical and rectal cancer s/p colostomy, PAD, diabetic foot and heel ulcer,, neuropathy, hypothyroidism, hypomagnesemia, anemia and arthritis who presented to the ED after a fall at home and found to have a moderately angulated midshaft fracture of the left femur.     At baseline, patient ambulates with a walker however recently, she has been losing strength in her legs. Patient has chronic lower extremity swelling and neuropathy but has had recent increased redness to her legs, L>R.  She she has had poor appetite but denies any dizziness, recent illness, fevers, chills, nausea or vomiting. She was recently treated for possible UTI about a week ago.   **Interim History: She was taken for surgical intervention and postoperatively she was very somnolent and had a new oxygen requirement.  We transferred her to the PCU and she was weaned off but then had to be placed back on Supplemental O2 via  when she was sleeping.  Has a diabetic foot ulcer and Underwent aortogram of the Bilaterl LE and had a Left Leg Run-off on 05/30/24. She had a L common iliac artery stent placed.  PT OT recommended CIR but per CIR, she is not candidate for CIR, SNF was recommended but family does not want SNF, now referral has been sent to LTAC.  Assessment & Plan:   Principal Problem:   Femur fracture, left (HCC) Active Problems:   Fall at home, initial encounter   Cellulitis of both lower extremities  Mechanical fall and  Left femur fracture s/p Fall:  Orthopedic surgery consulted and she is s/p ORIF now. Ortho recommending weightbearing as tolerated and reinforce the dressings as needed.  They have initiated on enoxaparin  for VTE prophylaxis.  PT OT recommended CIR but per CIR, she is not a candidate  for them and they recommended SNF but family wants LTAC.  Referral made.  TOC working on placement.   Lower Extremity Cellulitis: Patient with chronic lower extremity edema found to have mild cellulitis of the lower extremities but now has surgical intervention and Leg is wrapper; C/w IV Cefazolin  1 gram po q8h for now with expected stop date 7/18; CTM and Trend CBC, fever curve   Acute Respiratory Failure w/ Hypoxia: Post Op and ? Related to Anesthesia however was hypoxic overnight few nights ago and had to be placed back on Supplemental O2.  Has been weaned to room air and she is comfortable.  Could have been due to atelectasis.   Type 2 Diabetes Mellitus complicated by Neuropathy:  Last A1c 6.5% 3 months ago.  PTA medications metformin , which is on hold.  Blood sugar elevated and labile.  Realized that she is on cardiac diet instead of diabetic diet.  Transitioned her to diabetic diet.  Will continue SSI.   Diabetic Foot Ulcer / Nonhealing pressure ulcer/chronic foot pain:  Patient with full-thickness ulcer at the posterior left heel with large eschar (see media tab);  No significant improvement in left heel ulcer over the last few weeks.  Followed by podiatry and wound care. Wound care consulted, appreciate recs; See below for LLE Common iliac Intervention. Will likely need Podiatry/Orth to evaluate for debridement once she heals from her Hip Sx. The C is recommending IR team providing the patient a healing shoe from the [employer] and  has been placed orders.  I increased her long-acting oxycodone  to 20 mg twice daily on the afternoon of 06/01/2024 and since then, she has not received any as needed short acting oxycodone .  Pain appears to be very well-controlled.   PAD: ABIs 02/16/2024 are 0.85 on the right biphasic and 1.01 on the left biphasic.  Follows vascular surgery as outpatient.  Vascular surgery consulted by Ortho in regards to possible intervention during this hospitalization; Underwent  Aortogram/Arteriogram of Bilaterl LE and had stenting of the L Common iliac Artery and was given Plavix  load; Now on Clopidogrel  75 mg po daily w/ Breakfast -Continue Atorvastatin  as below   Hydronephrosis: Improving CT A/P 5 days ago showed bilateral hydronephrosis, R>L but no visible obstructing stone; Hydronephrosis thought to be due to patient's rectal cancer. Checked Renal U/S and showed  Mild right hydronephrosis which hasimproved since prior CT examination and Interval resolution of left Hydronephrosis. There was also Moderate right and mild left renal cortical atrophy. Will hold off Urological Consult given her improvement    Chronic Hypomagnesemia:  Unclear etiology, receives intermittent IV magnesium  by cancer center.  Magnesium  1.5 yesterday, will replenish.  Recheck in the morning.   Essential HTN:  BP very well-controlled, remains on IV as needed hydralazine .   Cervical Cancer / Rectal cancer s/p colostomy: Followed closely with Oncology, patient has declined further chemotherapy or surgery for removal of the remaining tumor.  Continue Vitamin Supplementation   Normocytic Anemia / IDA:  Baseline 8-9; workup indicates iron deficiency anemia.  Continue iron supplementation w/ Ferrous Sulfate  325 mg po MWF.    Chronic BLE edema: Thought to be due to lymphedema;  Apply TED hose however her left leg is now wrapped from her surgery   HLD: C/w Atorvastatin  40 mg.   Hypothyroidism: C/w Levothyroxine  137 mcg po Daily    GERD/GI Prophylaxis: Continue home PPI w/ Pantoprazole  40 mg po Daily   Mood Disorder:  Continue Sertraline  150 mg po Daily   Constipation: Resolved.  Continue bowel regimen.   Perineal/groin and Buttock Wounds: WOC Nurse Consult as they are bleeding some   Class I Obesity: Complicates overall prognosis and care. Estimated body mass index is 32.12 kg/m as calculated from the following:   Height as of this encounter: 5' (1.524 m).   Weight as of this encounter: 74.6  kg. Weight Loss and Dietary Counseling given   DVT prophylaxis: Place TED hose Start: 05/26/24 2205 SCDs Start: 05/26/24 1943   Code Status: Full Code  Family Communication: None present at bedside.  Plan of care discussed with patient in length and he/she verbalized understanding and agreed with it.  Discussed with her daughter over the speaker phone while I was still in her room.  Status is: Inpatient Remains inpatient appropriate because: Medically stable, pending placement for   Estimated body mass index is 32.12 kg/m as calculated from the following:   Height as of this encounter: 5' (1.524 m).   Weight as of this encounter: 74.6 kg.    Nutritional Assessment: Body mass index is 32.12 kg/m.SABRA Seen by dietician.  I agree with the assessment and plan as outlined below: Nutrition Status:        . Skin Assessment: I have examined the patient's skin and I agree with the wound assessment as performed by the wound care RN as outlined below:    Consultants:  Vascular surgery and orthopedics  Procedures:  As above  Antimicrobials:  Anti-infectives (From admission, onward)    Start  Dose/Rate Route Frequency Ordered Stop   05/27/24 1410  vancomycin  (VANCOCIN ) powder  Status:  Discontinued          As needed 05/27/24 1410 05/27/24 1528   05/26/24 2245  ceFAZolin  (ANCEF ) IVPB 1 g/50 mL premix        1 g 100 mL/hr over 30 Minutes Intravenous Every 8 hours 05/26/24 2150           Subjective: Patient seen and examined.  She states that her pain is very minimal and much better controlled.  She had no other complaint.  Daughter was not at the bedside, we called her daughter and I spoke to her over the speaker phone.  I informed her that since patient's pain is very well-controlled and she has not required any as needed medications, she will likely not be approved for LTAC.  Daughter was okay with her going to SNF as long as she visits the skilled facilities who are offering  the bed.  She was also upset claiming that physical therapy did not work with her mother yesterday, only sat her in the recliner.  I conveyed her concerns to the [facility] as well as the PT OT team.  They will try to see her today.  She may be candidate for CIR or SNF.  TOC will touch base with the family.  Objective: Vitals:   06/01/24 2121 06/01/24 2321 06/02/24 0250 06/02/24 0738  BP: (!) 142/39 (!) 142/44 (!) 138/43 (!) 123/39  Pulse: 71 68 64 60  Resp: 18 20 20 13   Temp: 99.6 F (37.6 C) 98.7 F (37.1 C) 97.8 F (36.6 C) 98.2 F (36.8 C)  TempSrc: Oral Oral Oral Oral  SpO2: 96% 93% 97% 95%  Weight:      Height:        Intake/Output Summary (Last 24 hours) at 06/02/2024 0817 Last data filed at 06/02/2024 0739 Gross per 24 hour  Intake 150 ml  Output 850 ml  Net -700 ml   Filed Weights   05/27/24 1130 05/28/24 0353 05/30/24 0352  Weight: 70 kg 73.9 kg 74.6 kg    Examination:  General exam: Appears calm and comfortable  Respiratory system: Clear to auscultation. Respiratory effort normal. Cardiovascular system: S1 & S2 heard, RRR. No JVD, murmurs, rubs, gallops or clicks. No pedal edema. Gastrointestinal system: Abdomen is nondistended, soft and nontender. No organomegaly or masses felt. Normal bowel sounds heard. Central nervous system: Alert and oriented. No focal neurological deficits. Extremities: Symmetric 5 x 5 power. Skin: No rashes, lesions or ulcers.  Psychiatry: Judgement and insight appear normal. Mood & affect appropriate.    Data Reviewed: I have personally reviewed following labs and imaging studies  CBC: Recent Labs  Lab 05/29/24 1146 05/30/24 0330 05/31/24 0350 06/01/24 0916 06/02/24 0425  WBC 7.5 7.2 7.8 8.5 6.3  NEUTROABS 6.3 5.6 6.3 6.9 4.7  HGB 10.6* 9.7* 8.9* 9.7* 8.4*  HCT 34.7* 32.0* 30.3* 32.1* 28.0*  MCV 87.2 86.5 88.1 85.8 87.0  PLT 155 155 179 210 189   Basic Metabolic Panel: Recent Labs  Lab 05/28/24 0300 05/29/24 1146  05/30/24 0330 05/31/24 0350 06/01/24 0915 06/01/24 0916 06/02/24 0425  NA 139 139 139 139  --  138 138  K 3.8 4.6 4.3 4.3  --  4.4 4.4  CL 105 103 104 103  --  102 102  CO2 25 23 27 26   --  26 28  GLUCOSE 276* 247* 147* 136*  --  224* 181*  BUN 16 19 20  25*  --  18 19  CREATININE 1.22* 0.94 0.85 1.09*  --  0.82 0.80  CALCIUM  7.8* 8.3* 8.3* 8.2*  --  8.6* 8.3*  MG 1.5* 1.3* 1.8 1.6* 1.5*  --   --   PHOS 2.9 2.6 3.2 3.6  --   --   --    GFR: Estimated Creatinine Clearance: 52.2 mL/min (by C-G formula based on SCr of 0.8 mg/dL). Liver Function Tests: Recent Labs  Lab 05/27/24 0631 05/28/24 0300 05/29/24 1146 05/30/24 0330 05/31/24 0350  AST 14* 24 22 21 27   ALT 15 14 10 10 10   ALKPHOS 79 79 106 103 129*  BILITOT 0.5 0.4 0.6 0.6 0.6  PROT 5.0* 4.7* 5.3* 4.9* 4.7*  ALBUMIN 2.1* 2.0* 2.1* 1.9* 1.7*   No results for input(s): LIPASE, AMYLASE in the last 168 hours. No results for input(s): AMMONIA in the last 168 hours. Coagulation Profile: Recent Labs  Lab 05/26/24 1730  INR 1.2   Cardiac Enzymes: No results for input(s): CKTOTAL, CKMB, CKMBINDEX, TROPONINI in the last 168 hours. BNP (last 3 results) No results for input(s): PROBNP in the last 8760 hours. HbA1C: No results for input(s): HGBA1C in the last 72 hours. CBG: Recent Labs  Lab 06/01/24 0807 06/01/24 1213 06/01/24 1607 06/01/24 2119 06/02/24 0607  GLUCAP 114* 227* 319* 252* 170*   Lipid Profile: Recent Labs    05/31/24 0350  CHOL 77  HDL 23*  LDLCALC 41  TRIG 65  CHOLHDL 3.3   Thyroid  Function Tests: No results for input(s): TSH, T4TOTAL, FREET4, T3FREE, THYROIDAB in the last 72 hours. Anemia Panel: No results for input(s): VITAMINB12, FOLATE, FERRITIN, TIBC, IRON, RETICCTPCT in the last 72 hours. Sepsis Labs: No results for input(s): PROCALCITON, LATICACIDVEN in the last 168 hours.  Recent Results (from the past 240 hours)  Surgical pcr screen      Status: None   Collection Time: 05/27/24 11:08 AM   Specimen: Nasal Mucosa; Nasal Swab  Result Value Ref Range Status   MRSA, PCR NEGATIVE NEGATIVE Final   Staphylococcus aureus NEGATIVE NEGATIVE Final    Comment: (NOTE) The Xpert SA Assay (FDA approved for NASAL specimens in patients 88 years of age and older), is one component of a comprehensive surveillance program. It is not intended to diagnose infection nor to guide or monitor treatment. Performed at [facility], [address] [place], [place] [address]      Radiology Studies: No results found.  Scheduled Meds:  atorvastatin   40 mg Oral QHS   baclofen   10 mg Oral BID   Chlorhexidine  Gluconate Cloth  6 each Topical Daily   cholecalciferol   2,000 Units Oral Daily   clopidogrel   75 mg Oral Q breakfast   clotrimazole    Topical Daily   cyanocobalamin   1,000 mcg Oral Daily   feeding supplement  237 mL Oral TID BM   ferrous sulfate   325 mg Oral Q M,W,F   gabapentin   300 mg Oral QHS   gabapentin   600 mg Oral Daily   guaiFENesin   1,200 mg Oral BID   insulin  aspart  0-20 Units Subcutaneous TID WC   insulin  aspart  0-5 Units Subcutaneous QHS   leptospermum manuka honey  1 Application Topical Daily   levothyroxine   137 mcg Oral Q0600   multivitamin with minerals  1 tablet Oral Q breakfast   oxyCODONE   20 mg Oral Q12H   pantoprazole   40 mg Oral QAC breakfast   polyethylene  glycol  17 g Oral BID   sertraline   150 mg Oral QHS   sodium chloride  flush  3 mL Intravenous Q12H   Continuous Infusions:   ceFAZolin  (ANCEF ) IV 1 g (06/02/24 0626)     LOS: 7 days   Fredia Skeeter, MD [facility]  06/02/2024, 8:17 AM   *Please note that this is a verbal dictation therefore any spelling or grammatical errors are due to the Dragon Medical One system interpretation.  Please page via Amion and do not message via secure chat for urgent patient care matters. Secure chat can be used for non urgent patient care  matters.  How to contact the [facility] Attending or Consulting provider 7A - 7P or covering provider during after hours 7P -7A, for this patient?  Check the care team in [facility] and look for a) attending/consulting [facility] provider listed and b) the [facility] team listed. Page or secure chat 7A-7P. Log into [email or web address] and use [facility]'s universal password to access. If you do not have the password, please contact the hospital operator. Locate the [facility] provider you are looking for under [facility] and page to a number that you can be directly reached. If you still have difficulty reaching the provider, please page the [facility] (Director on Call) for the Hospitalists listed on amion for assistance.

## 2024-06-02 NOTE — Telephone Encounter (Signed)
 Lab/flush/OV in 3-4 weeks please - in basket message request

## 2024-06-03 DIAGNOSIS — S7292XA Unspecified fracture of left femur, initial encounter for closed fracture: Secondary | ICD-10-CM | POA: Diagnosis not present

## 2024-06-03 LAB — CBC WITH DIFFERENTIAL/PLATELET
Abs Immature Granulocytes: 0.04 K/uL (ref 0.00–0.07)
Basophils Absolute: 0 K/uL (ref 0.0–0.1)
Basophils Relative: 1 %
Eosinophils Absolute: 0.1 K/uL (ref 0.0–0.5)
Eosinophils Relative: 2 %
HCT: 29.7 % — ABNORMAL LOW (ref 36.0–46.0)
Hemoglobin: 8.9 g/dL — ABNORMAL LOW (ref 12.0–15.0)
Immature Granulocytes: 1 %
Lymphocytes Relative: 16 %
Lymphs Abs: 1 K/uL (ref 0.7–4.0)
MCH: 26.4 pg (ref 26.0–34.0)
MCHC: 30 g/dL (ref 30.0–36.0)
MCV: 88.1 fL (ref 80.0–100.0)
Monocytes Absolute: 0.5 K/uL (ref 0.1–1.0)
Monocytes Relative: 7 %
Neutro Abs: 4.9 K/uL (ref 1.7–7.7)
Neutrophils Relative %: 73 %
Platelets: 223 K/uL (ref 150–400)
RBC: 3.37 MIL/uL — ABNORMAL LOW (ref 3.87–5.11)
RDW: 16.2 % — ABNORMAL HIGH (ref 11.5–15.5)
WBC: 6.6 K/uL (ref 4.0–10.5)
nRBC: 0 % (ref 0.0–0.2)

## 2024-06-03 LAB — GLUCOSE, CAPILLARY
Glucose-Capillary: 203 mg/dL — ABNORMAL HIGH (ref 70–99)
Glucose-Capillary: 289 mg/dL — ABNORMAL HIGH (ref 70–99)
Glucose-Capillary: 306 mg/dL — ABNORMAL HIGH (ref 70–99)
Glucose-Capillary: 123 mg/dL — ABNORMAL HIGH (ref 70–99)

## 2024-06-03 LAB — MAGNESIUM: Magnesium: 1.7 mg/dL (ref 1.7–2.4)

## 2024-06-03 NOTE — Progress Notes (Signed)
 PROGRESS NOTE    NAVY ROTHSCHILD  FMW:[id number] DOB: 04-24-1946 DOA: 05/26/2024 PCP: Waylan Almarie SAUNDERS, MD   Brief Narrative:  [name] is a 78 y.o. female with medical history significant for T2DM, HLD, HTN, cervical and rectal cancer s/p colostomy, PAD, diabetic foot and heel ulcer,, neuropathy, hypothyroidism, hypomagnesemia, anemia and arthritis who presented to the ED after a fall at home and found to have a moderately angulated midshaft fracture of the left femur.     At baseline, patient ambulates with a walker however recently, she has been losing strength in her legs. Patient has chronic lower extremity swelling and neuropathy but has had recent increased redness to her legs, L>R.  She she has had poor appetite but denies any dizziness, recent illness, fevers, chills, nausea or vomiting. She was recently treated for possible UTI about a week ago.   **Interim History: She was taken for surgical intervention and postoperatively she was very somnolent and had a new oxygen requirement.  We transferred her to the PCU and she was weaned off but then had to be placed back on Supplemental O2 via [place] when she was sleeping.  Has a diabetic foot ulcer and Underwent aortogram of the Bilaterl LE and had a Left Leg Run-off on 05/30/24. She had a L common iliac artery stent placed.  PT OT recommended CIR but per CIR, she is not candidate for CIR, SNF was recommended but family does not want SNF, now referral has been sent to LTAC.  Assessment & Plan:   Principal Problem:   Femur fracture, left (HCC) Active Problems:   Fall at home, initial encounter   Cellulitis of both lower extremities  Mechanical fall and  Left femur fracture s/p Fall:  Orthopedic surgery consulted and she is s/p ORIF now. Ortho recommending weightbearing as tolerated and reinforce the dressings as needed.  They have initiated on enoxaparin  for VTE prophylaxis.  PT OT recommended CIR but per CIR, she is not a candidate  for them and they recommended SNF but family wants LTAC which has been denied.  SNF is the next option which family will have to pursue.  TOC working with family.  Lower Extremity Cellulitis: Patient with chronic lower extremity edema found to have mild cellulitis of the lower extremities but now has surgical intervention and Leg is wrapper; C/w IV Cefazolin  1 gram po q8h for now with expected stop date 7/18; CTM and Trend CBC, fever curve   Acute Respiratory Failure w/ Hypoxia: Post Op and ? Related to Anesthesia however was hypoxic overnight few nights ago and had to be placed back on Supplemental O2.  Has been weaned to room air and she is comfortable.  Could have been due to atelectasis.   Type 2 Diabetes Mellitus complicated by Neuropathy:  Last A1c 6.5% 3 months ago.  PTA medications metformin , which is on hold.  Blood sugar elevated and labile.  Realized that she is on cardiac diet instead of diabetic diet.  Transitioned her to diabetic diet.  Will continue SSI.   Diabetic Foot Ulcer / Nonhealing pressure ulcer/chronic foot pain:  Patient with full-thickness ulcer at the posterior left heel with large eschar (see media tab);  No significant improvement in left heel ulcer over the last few weeks.  Followed by podiatry and wound care. Wound care consulted, appreciate recs; See below for LLE Common iliac Intervention. Will likely need Podiatry/Orth to evaluate for debridement once she heals from her Hip Sx. The C is recommending  IR team providing the patient a healing shoe from the [employer] and has been placed orders.  I increased her long-acting oxycodone  to 20 mg twice daily on the afternoon of 06/01/2024 and since then, her pain appears to be well-controlled except that she had slightly increased pain this morning.  However she has received only 1 as needed dose of oxycodone  in last 24 hours.   PAD: ABIs 02/16/2024 are 0.85 on the right biphasic and 1.01 on the left biphasic.  Follows vascular surgery as  outpatient.  Vascular surgery consulted by Ortho in regards to possible intervention during this hospitalization; Underwent Aortogram/Arteriogram of Bilaterl LE and had stenting of the L Common iliac Artery and was given Plavix  load; Now on Clopidogrel  75 mg po daily w/ Breakfast -Continue Atorvastatin  as below   Hydronephrosis: Improving CT A/P 5 days ago showed bilateral hydronephrosis, R>L but no visible obstructing stone; Hydronephrosis thought to be due to patient's rectal cancer. Checked Renal U/S and showed  Mild right hydronephrosis which hasimproved since prior CT examination and Interval resolution of left Hydronephrosis. There was also Moderate right and mild left renal cortical atrophy. Will hold off Urological Consult given her improvement    Chronic Hypomagnesemia:  Unclear etiology, receives intermittent IV magnesium  by cancer center.  Magnesium  1.5 yesterday, will replenish.  Recheck in the morning.   Essential HTN:  BP very well-controlled, remains on IV as needed hydralazine .   Cervical Cancer / Rectal cancer s/p colostomy: Followed closely with Oncology, patient has declined further chemotherapy or surgery for removal of the remaining tumor.  Continue Vitamin Supplementation   Normocytic Anemia / IDA:  Baseline 8-9; workup indicates iron deficiency anemia.  Continue iron supplementation w/ Ferrous Sulfate  325 mg po MWF.    Chronic BLE edema: Thought to be due to lymphedema;  Apply TED hose however her left leg is now wrapped from her surgery   HLD: C/w Atorvastatin  40 mg.   Hypothyroidism: C/w Levothyroxine  137 mcg po Daily    GERD/GI Prophylaxis: Continue home PPI w/ Pantoprazole  40 mg po Daily   Mood Disorder:  Continue Sertraline  150 mg po Daily   Constipation: Resolved.  Continue bowel regimen.   Perineal/groin and Buttock Wounds: WOC Nurse Consult as they are bleeding some   Class I Obesity: Complicates overall prognosis and care. Estimated body mass index is 32.12  kg/m as calculated from the following:   Height as of this encounter: 5' (1.524 m).   Weight as of this encounter: 74.6 kg. Weight Loss and Dietary Counseling given   DVT prophylaxis: Place TED hose Start: 05/26/24 2205 SCDs Start: 05/26/24 1943   Code Status: Full Code  Family Communication: None present at bedside.  Plan of care discussed with patient in length and he/she verbalized understanding and agreed with it.  Discussed with her daughter over the speaker phone while I was still in her room.  Status is: Inpatient Remains inpatient appropriate because: Medically stable, pending placement to SNF   Estimated body mass index is 32.12 kg/m as calculated from the following:   Height as of this encounter: 5' (1.524 m).   Weight as of this encounter: 74.6 kg.    Nutritional Assessment: Body mass index is 32.12 kg/m.SABRA Seen by dietician.  I agree with the assessment and plan as outlined below: Nutrition Status:        . Skin Assessment: I have examined the patient's skin and I agree with the wound assessment as performed by the wound care RN as  outlined below:    Consultants:  Vascular surgery and orthopedics  Procedures:  As above  Antimicrobials:  Anti-infectives (From admission, onward)    Start     Dose/Rate Route Frequency Ordered Stop   05/27/24 1410  vancomycin  (VANCOCIN ) powder  Status:  Discontinued          As needed 05/27/24 1410 05/27/24 1528   05/26/24 2245  ceFAZolin  (ANCEF ) IVPB 1 g/50 mL premix        1 g 100 mL/hr over 30 Minutes Intravenous Every 8 hours 05/26/24 2150           Subjective: Patient seen and examined, other than extremity pain in the left, no other complaint.  Pain is 6 out of 10 today.  Objective: Vitals:   06/02/24 1940 06/02/24 2316 06/03/24 0311 06/03/24 0745  BP: (!) 137/48 (!) 151/43 (!) 152/41 (!) 158/42  Pulse: 62 68 60 67  Resp: 14 13 13 15   Temp: 98.5 F (36.9 C) 98.3 F (36.8 C) 98 F (36.7 C) 98.1 F (36.7  C)  TempSrc: Oral Oral Oral Oral  SpO2: 94% 97% 92% 100%  Weight:      Height:        Intake/Output Summary (Last 24 hours) at 06/03/2024 1121 Last data filed at 06/03/2024 9390 Gross per 24 hour  Intake 630 ml  Output 1050 ml  Net -420 ml   Filed Weights   05/27/24 1130 05/28/24 0353 05/30/24 0352  Weight: 70 kg 73.9 kg 74.6 kg    Examination:  General exam: Appears calm and comfortable  Respiratory system: Clear to auscultation. Respiratory effort normal. Cardiovascular system: S1 & S2 heard, RRR. No JVD, murmurs, rubs, gallops or clicks. No pedal edema. Gastrointestinal system: Abdomen is nondistended, soft and nontender. No organomegaly or masses felt. Normal bowel sounds heard. Central nervous system: Alert and oriented. No focal neurological deficits. Extremities: Symmetric 5 x 5 power. Skin: No rashes, lesions or ulcers.  Psychiatry: Judgement and insight appear normal. Mood & affect appropriate.    Data Reviewed: I have personally reviewed following labs and imaging studies  CBC: Recent Labs  Lab 05/30/24 0330 05/31/24 0350 06/01/24 0916 06/02/24 0425 06/03/24 0500  WBC 7.2 7.8 8.5 6.3 6.6  NEUTROABS 5.6 6.3 6.9 4.7 4.9  HGB 9.7* 8.9* 9.7* 8.4* 8.9*  HCT 32.0* 30.3* 32.1* 28.0* 29.7*  MCV 86.5 88.1 85.8 87.0 88.1  PLT 155 179 210 189 223   Basic Metabolic Panel: Recent Labs  Lab 05/28/24 0300 05/29/24 1146 05/30/24 0330 05/31/24 0350 06/01/24 0915 06/01/24 0916 06/02/24 0425 06/03/24 0500  NA 139 139 139 139  --  138 138  --   K 3.8 4.6 4.3 4.3  --  4.4 4.4  --   CL 105 103 104 103  --  102 102  --   CO2 25 23 27 26   --  26 28  --   GLUCOSE 276* 247* 147* 136*  --  224* 181*  --   BUN 16 19 20  25*  --  18 19  --   CREATININE 1.22* 0.94 0.85 1.09*  --  0.82 0.80  --   CALCIUM  7.8* 8.3* 8.3* 8.2*  --  8.6* 8.3*  --   MG 1.5* 1.3* 1.8 1.6* 1.5*  --   --  1.7  PHOS 2.9 2.6 3.2 3.6  --   --   --   --    GFR: Estimated Creatinine Clearance: 52.2  mL/min (by C-G formula based  on SCr of 0.8 mg/dL). Liver Function Tests: Recent Labs  Lab 05/28/24 0300 05/29/24 1146 05/30/24 0330 05/31/24 0350  AST 24 22 21 27   ALT 14 10 10 10   ALKPHOS 79 106 103 129*  BILITOT 0.4 0.6 0.6 0.6  PROT 4.7* 5.3* 4.9* 4.7*  ALBUMIN 2.0* 2.1* 1.9* 1.7*   No results for input(s): LIPASE, AMYLASE in the last 168 hours. No results for input(s): AMMONIA in the last 168 hours. Coagulation Profile: No results for input(s): INR, PROTIME in the last 168 hours.  Cardiac Enzymes: No results for input(s): CKTOTAL, CKMB, CKMBINDEX, TROPONINI in the last 168 hours. BNP (last 3 results) No results for input(s): PROBNP in the last 8760 hours. HbA1C: No results for input(s): HGBA1C in the last 72 hours. CBG: Recent Labs  Lab 06/02/24 0607 06/02/24 1121 06/02/24 1702 06/02/24 2057 06/03/24 0606  GLUCAP 170* 160* 255* 203* 203*   Lipid Profile: No results for input(s): CHOL, HDL, LDLCALC, TRIG, CHOLHDL, LDLDIRECT in the last 72 hours.  Thyroid  Function Tests: No results for input(s): TSH, T4TOTAL, FREET4, T3FREE, THYROIDAB in the last 72 hours. Anemia Panel: No results for input(s): VITAMINB12, FOLATE, FERRITIN, TIBC, IRON, RETICCTPCT in the last 72 hours. Sepsis Labs: No results for input(s): PROCALCITON, LATICACIDVEN in the last 168 hours.  Recent Results (from the past 240 hours)  Surgical pcr screen     Status: None   Collection Time: 05/27/24 11:08 AM   Specimen: Nasal Mucosa; Nasal Swab  Result Value Ref Range Status   MRSA, PCR NEGATIVE NEGATIVE Final   Staphylococcus aureus NEGATIVE NEGATIVE Final    Comment: (NOTE) The Xpert SA Assay (FDA approved for NASAL specimens in patients 66 years of age and older), is one component of a comprehensive surveillance program. It is not intended to diagnose infection nor to guide or monitor treatment. Performed at [facility],  [address] [place], [place] [address]      Radiology Studies: No results found.  Scheduled Meds:  atorvastatin   40 mg Oral QHS   baclofen   10 mg Oral BID   Chlorhexidine  Gluconate Cloth  6 each Topical Daily   cholecalciferol   2,000 Units Oral Daily   clopidogrel   75 mg Oral Q breakfast   clotrimazole    Topical Daily   cyanocobalamin   1,000 mcg Oral Daily   feeding supplement  237 mL Oral TID BM   ferrous sulfate   325 mg Oral Q M,W,F   gabapentin   300 mg Oral QHS   gabapentin   600 mg Oral Daily   guaiFENesin   1,200 mg Oral BID   insulin  aspart  0-20 Units Subcutaneous TID WC   insulin  aspart  0-5 Units Subcutaneous QHS   leptospermum manuka honey  1 Application Topical Daily   levothyroxine   137 mcg Oral Q0600   multivitamin with minerals  1 tablet Oral Q breakfast   oxyCODONE   20 mg Oral Q12H   pantoprazole   40 mg Oral QAC breakfast   polyethylene glycol  17 g Oral BID   sertraline   150 mg Oral QHS   sodium chloride  flush  3 mL Intravenous Q12H   Continuous Infusions:   ceFAZolin  (ANCEF ) IV 1 g (06/03/24 0626)     LOS: 8 days   Fredia Skeeter, MD [facility]  06/03/2024, 11:21 AM   *Please note that this is a verbal dictation therefore any spelling or grammatical errors are due to the Dragon Medical One system interpretation.  Please page via Amion and do  not message via secure chat for urgent patient care matters. Secure chat can be used for non urgent patient care matters.  How to contact the [facility] Attending or Consulting provider 7A - 7P or covering provider during after hours 7P -7A, for this patient?  Check the care team in [facility] and look for a) attending/consulting [facility] provider listed and b) the [facility] team listed. Page or secure chat 7A-7P. Log into [email or web address] and use [place]'s universal password to access. If you do not have the password, please contact the hospital operator. Locate the [facility] provider you are looking for under [facility] and page  to a number that you can be directly reached. If you still have difficulty reaching the provider, please page the [facility] (Director on Call) for the Hospitalists listed on amion for assistance.

## 2024-06-03 NOTE — Progress Notes (Signed)
 Mobility Specialist Progress Note:   06/03/24 0923  Mobility  Activity Transferred from chair to bed  Level of Assistance Moderate assist, patient does 50-74% (+2)  Assistive Device Stedy  LLE Weight Bearing Per Provider Order WBAT  Activity Response Tolerated well  Mobility Referral Yes  Mobility visit 1 Mobility  Mobility Specialist Start Time (ACUTE ONLY) 0910  Mobility Specialist Stop Time (ACUTE ONLY) [id number]  Mobility Specialist Time Calculation (min) (ACUTE ONLY) 11 min   Pt received in bed, agreeable to mobility session. ModA+2 to stand with Stedy. Tolerated well, c/o LLE pain. Pt lying in bed with all needs met, left with nsg.   [name] Mobility Specialist Please contact via Special educational needs teacher or  Rehab office at [phone]

## 2024-06-03 NOTE — NC FL2 (Signed)
 [place]  MEDICAID FL2 LEVEL OF CARE FORM     IDENTIFICATION  Patient Name: [name] Birthdate: 08/15/1946 Sex: female Admission Date (Current Location): 05/26/2024  [facility] and [place] Number:  Producer, television/film/video and Address:  [facility], [address] [place], [place] [address]      Provider Number: [id number]  Attending Physician Name and Address:  Vernon Ranks, MD  Relative Name and Phone Number:       Current Level of Care: Hospital Recommended Level of Care: Skilled Nursing Facility Prior Approval Number:    Date Approved/Denied:   PASRR Number: [id number]  Discharge Plan: SNF    Current Diagnoses: Patient Active Problem List   Diagnosis Date Noted   Femur fracture, left (HCC) 05/26/2024   Fall at home, initial encounter 05/26/2024   Cellulitis of both lower extremities 05/26/2024   Multiple drug resistant organism (MDRO) culture positive 10/19/2023   PUD (peptic ulcer disease) 10/15/2023   Coffee ground emesis 10/14/2023   Upper abdominal pain, unspecified 10/14/2023   Acute UTI 10/14/2023   Gastrointestinal hemorrhage 10/13/2023   Upper GI bleed 10/12/2023   Opioid dependence (HCC) 09/12/2023   Chronic diastolic CHF (congestive heart failure) (HCC) 09/10/2023   Pain in joint of right knee 09/01/2023   Parastomal hernia without obstruction or gangrene 04/27/2023   Colostomy complication (HCC) 04/27/2023   Ear stuffiness, right 11/24/2022   PAD (peripheral artery disease) (HCC) 11/30/2021   Obesity (BMI 30-39.9) 11/30/2021   Acute respiratory failure with hypoxia (HCC) 11/29/2021   Sepsis due to pneumonia (HCC) 11/29/2021   Severe protein-calorie malnutrition (HCC) 11/29/2021   Acute respiratory disease due to COVID-19 virus 11/29/2021   Duodenal ulcer    Acute gastric ulcer with hemorrhage    Abnormal CT of the abdomen    Malnutrition of moderate degree 10/09/2021   Sepsis (HCC) 10/08/2021   Symptomatic anemia  09/23/2021   Pressure injury of skin 08/26/2021   Sepsis secondary to UTI (HCC) 08/25/2021   Goals of care, counseling/discussion 07/01/2021   Coronary artery disease 03/13/2021   Cellulitis of right lower limb 02/22/2021   Malignant neoplasm of overlapping sites of cervix (HCC) 02/18/2021   Rectal cancer (HCC) 01/29/2021   Rectal mass    Hydronephrosis    Iron deficiency anemia due to chronic blood loss    Non-intractable vomiting    Lower abdominal pain    UTI (urinary tract infection) 01/18/2021   Peripheral nerve disease 09/07/2020   Vitamin D  deficiency 09/07/2020   Carotid artery stenosis, symptomatic, right 03/28/2020   Preoperative clearance 03/21/2020   History of COVID-19 02/22/2020   Carotid artery disease (HCC) 02/22/2020   History of CVA (cerebrovascular accident) 02/11/2020   Macrocytic anemia 11/29/2019   Hypokalemia 07/27/2019   Hypomagnesemia 07/27/2019   History of cervical cancer 03/18/2019   DMII (diabetes mellitus, type 2) (HCC) 05/04/2018   Essential hypertension 05/04/2018   Bilateral lower extremity edema 07/31/2017   Hypothyroidism 06/06/2010   Hyperlipidemia associated with type 2 diabetes mellitus (HCC) 10/20/2008    Orientation RESPIRATION BLADDER Height & Weight     Self  Normal Continent, External catheter Weight: 164 lb 7.4 oz (74.6 kg) Height:  5' (152.4 cm)  BEHAVIORAL SYMPTOMS/MOOD NEUROLOGICAL BOWEL NUTRITION STATUS      Continent Diet (see discharge summary)  AMBULATORY STATUS COMMUNICATION OF NEEDS Skin   Limited Assist Verbally Surgical wounds (surgical incision left leg, vaacular ulcher heel left)  Personal Care Assistance Level of Assistance  Bathing, Feeding, Dressing Bathing Assistance: Limited assistance Feeding assistance: Independent Dressing Assistance: Limited assistance     Functional Limitations Info  Sight, Hearing, Speech Sight Info: Impaired Hearing Info: Impaired Speech Info: Adequate     SPECIAL CARE FACTORS FREQUENCY  PT (By licensed PT), OT (By licensed OT)     PT Frequency: 5x per week OT Frequency: 5x per week            Contractures Contractures Info: Not present    Additional Factors Info  Code Status, Allergies Code Status Info: FULL Allergies Info: Codeine           Current Medications (06/03/2024):  This is the current hospital active medication list Current Facility-Administered Medications  Medication Dose Route Frequency Provider Last Rate Last Admin   acetaminophen  (TYLENOL ) tablet 650 mg  650 mg Oral Q6H PRN Sheree Penne Bruckner, MD   650 mg at 05/31/24 2248   Or   acetaminophen  (TYLENOL ) suppository 650 mg  650 mg Rectal Q6H PRN Sheree Penne Bruckner, MD       acetaminophen  (TYLENOL ) tablet 650 mg  650 mg Oral Q4H PRN Sheree Penne Bruckner, MD   650 mg at 05/30/24 1207   atorvastatin  (LIPITOR) tablet 40 mg  40 mg Oral QHS Sheree Penne Bruckner, MD   40 mg at 06/02/24 2134   baclofen  (LIORESAL ) tablet 10 mg  10 mg Oral BID Sheree Penne Bruckner, MD   10 mg at 06/03/24 0920   ceFAZolin  (ANCEF ) IVPB 1 g/50 mL premix  1 g Intravenous Q8H Sheree Penne Bruckner, MD 100 mL/hr at 06/03/24 1255 1 g at 06/03/24 1255   Chlorhexidine  Gluconate Cloth 2 % PADS 6 each  6 each Topical Daily Sheree Penne Bruckner, MD   6 each at 06/03/24 0924   cholecalciferol  (VITAMIN D3) 25 MCG (1000 UNIT) tablet 2,000 Units  2,000 Units Oral Daily Sheree Penne Bruckner, MD   2,000 Units at 06/03/24 0920   clopidogrel  (PLAVIX ) tablet 75 mg  75 mg Oral Q breakfast Sheree Penne Bruckner, MD   75 mg at 06/03/24 0919   clotrimazole  (LOTRIMIN ) 1 % cream   Topical Daily Sheree Penne Bruckner, MD   Given at 06/02/24 1001   cyanocobalamin  (VITAMIN B12) tablet 1,000 mcg  1,000 mcg Oral Daily Sheree Penne Bruckner, MD   1,000 mcg at 06/03/24 0919   feeding supplement (ENSURE PLUS HIGH PROTEIN) liquid 237 mL  237 mL Oral TID BM Sheree Penne Bruckner, MD   237 mL at 06/03/24 1503   ferrous sulfate  tablet 325 mg  325 mg Oral Q M,W,F Sheree Penne Bruckner, MD   325 mg at 06/03/24 9080   gabapentin  (NEURONTIN ) capsule 300 mg  300 mg Oral QHS Sheree Penne Bruckner, MD   300 mg at 06/02/24 2134   gabapentin  (NEURONTIN ) capsule 600 mg  600 mg Oral Daily Sheree Penne Bruckner, MD   600 mg at 06/03/24 0919   guaiFENesin  (MUCINEX ) 12 hr tablet 1,200 mg  1,200 mg Oral BID Sheree Penne Bruckner, MD   1,200 mg at 06/03/24 0919   hydrALAZINE  (APRESOLINE ) injection 10 mg  10 mg Intravenous Q6H PRN Sheree Penne Bruckner, MD   10 mg at 06/03/24 1249   hydrALAZINE  (APRESOLINE ) injection 5 mg  5 mg Intravenous Q20 Min PRN Sheree Penne Bruckner, MD   5 mg at 05/30/24 1643   insulin  aspart (novoLOG ) injection 0-20 Units  0-20 Units Subcutaneous TID WC Vernon Ranks, MD  7 Units at 06/03/24 1249   insulin  aspart (novoLOG ) injection 0-5 Units  0-5 Units Subcutaneous QHS Pahwani, Ravi, MD   2 Units at 06/02/24 2134   labetalol  (NORMODYNE ) injection 10 mg  10 mg Intravenous Q10 min PRN Sheree Penne Bruckner, MD       leptospermum manuka honey (MEDIHONEY) paste 1 Application  1 Application Topical Daily Sheree Penne Bruckner, MD   1 Application at 06/02/24 1001   levalbuterol  (XOPENEX ) nebulizer solution 0.63 mg  0.63 mg Nebulization Q6H PRN Sheree Penne Bruckner, MD       levothyroxine  (SYNTHROID ) tablet 137 mcg  137 mcg Oral Q0600 Sheree Penne Bruckner, MD   137 mcg at 06/03/24 9383   multivitamin with minerals tablet 1 tablet  1 tablet Oral Q breakfast Sheree Penne Bruckner, MD   1 tablet at 06/03/24 0920   ondansetron  (ZOFRAN ) tablet 4 mg  4 mg Oral Q6H PRN Sheree Penne Bruckner, MD       Or   ondansetron  (ZOFRAN ) injection 4 mg  4 mg Intravenous Q6H PRN Sheree Penne Bruckner, MD       oxyCODONE  (Oxy IR/ROXICODONE ) immediate release tablet 2.5-5 mg  2.5-5 mg Oral Q6H PRN Sheree Penne Bruckner, MD    5 mg at 06/03/24 1148   oxyCODONE  (OXYCONTIN ) 12 hr tablet 20 mg  20 mg Oral Q12H Pahwani, Ravi, MD   20 mg at 06/03/24 0919   pantoprazole  (PROTONIX ) EC tablet 40 mg  40 mg Oral QAC breakfast Sheree Penne Bruckner, MD   40 mg at 06/03/24 0919   polyethylene glycol (MIRALAX  / GLYCOLAX ) packet 17 g  17 g Oral BID Sheikh, Omair [place], DO   17 g at 06/03/24 9077   senna-docusate (Senokot-S) tablet 1 tablet  1 tablet Oral QHS PRN Sheree Penne Bruckner, MD       sertraline  (ZOLOFT ) tablet 150 mg  150 mg Oral QHS Sheree Penne Bruckner, MD   150 mg at 06/02/24 2134   sodium chloride  flush (NS) 0.9 % injection 3 mL  3 mL Intravenous Q12H Sheree Penne Bruckner, MD   3 mL at 06/03/24 9077   sodium chloride  flush (NS) 0.9 % injection 3 mL  3 mL Intravenous PRN Sheree Penne Bruckner, MD       Facility-Administered Medications Ordered in Other Encounters  Medication Dose Route Frequency Provider Last Rate Last Admin   sodium chloride  flush (NS) 0.9 % injection 10 mL  10 mL Intravenous PRN Sherrill, Gary B, MD   10 mL at 03/30/24 1158     Discharge Medications: Please see discharge summary for a list of discharge medications.  Relevant Imaging Results:  Relevant Lab Results:   Additional Information SSN [id number]  Montie LOISE Louder, LCSW

## 2024-06-03 NOTE — TOC Progression Note (Signed)
 Transition of Care [facility]) - Progression Note    Patient Details  Name: [name] MRN: [id number] Date of Birth: Nov 04, 1946  Transition of Care [facility]) CM/SW Contact  Montie LOISE Louder, [place] Phone Number: 06/03/2024, 3:11 PM  Clinical Narrative:     Informed by Joycie HOWELLS- pt family reports unable to provide 24/7 supervision if admitted to CIR , which would be a barrier for CIR.  Family was informed SNF would be more appropriate. Per CM, family is agreeable to sending SNF referrals for placement, preference is in the [place] area.   Referrals sent- TOC will provide bed offers once available   Montie Louder, MSW, LCSW Clinical Social Worker    Expected Discharge Plan: Long Term Acute Care (LTAC) Barriers to Discharge: Insurance Authorization  Expected Discharge Plan and Services   Discharge Planning Services: CM Consult Post Acute Care Choice: Long Term Acute Care (LTAC) Living arrangements for the past 2 months: Single Family Home                             HH Agency: [facility]         Social Determinants of Health (SDOH) Interventions SDOH Screenings   Food Insecurity: No Food Insecurity (05/27/2024)  Housing: Low Risk  (05/27/2024)  Transportation Needs: No Transportation Needs (05/27/2024)  Utilities: Not At Risk (05/27/2024)  Depression (PHQ2-9): Low Risk  (01/17/2020)  Social Connections: Moderately Integrated (05/27/2024)  Tobacco Use: Medium Risk (05/27/2024)    Readmission Risk Interventions    10/15/2023    9:49 AM 09/11/2023   11:36 AM 12/02/2021    1:55 PM  Readmission Risk Prevention Plan  Transportation Screening Complete Complete Complete  PCP or Specialist Appt within 3-5 Days  Complete   HRI or Home Care Consult  Complete   Social Work Consult for Recovery Care Planning/Counseling  Complete   Palliative Care Screening  Not Applicable   Medication Review Oceanographer) Referral to Pharmacy Complete Complete  PCP or  Specialist appointment within 3-5 days of discharge Complete  Complete  HRI or Home Care Consult Complete  Complete  SW Recovery Care/Counseling Consult Complete  Complete  Palliative Care Screening Not Applicable  Not Applicable  Skilled Nursing Facility Not Applicable  Patient Refused

## 2024-06-03 NOTE — Progress Notes (Signed)
 Physical Therapy Treatment Patient Details Name: [name] MRN: [id number] DOB: [birth year] Today's Date: 06/03/2024   History of Present Illness 78 y.o female admitted 05/26/24 s/p fall in home with Lt periprosthetic femur fx. 7/11 S/p ORIF.  7/14 s/p stent of left common iliac artery for heel ulceration, still at high risk for LLE limb loss due to wound per vascular MD note. PMH: HLD, HTN, cervical and rectal CA s/p colostomy, PAD, neuropathy, hypothyroidism, arthritis, DM, CVA.    PT Comments  Pt is slowly progressing towards goals. Pt continues to be limited by pain in the LLE due to recent surgery and wound. Cues for purse lipped breathing throughout to manage hyperventilation due to pain. Pt was able to perform bed mobility at Mod to Max A and sit to stand from EOB at Mod to Max A 3x with RW and cues for sequencing. Pt family is unable to provide 24/7 assist but report pt had home health starting up just prior to hospitalization. Due to pt current functional status, home set up and available assistance at home recommending skilled physical therapy services at maximal intensity in order to address strength, balance and functional mobility to decrease risk for falls, injury, immobility, skin break down and re-hospitalization.       If plan is discharge home, recommend the following: Two people to help with walking and/or transfers;Assistance with cooking/housework;Assist for transportation;Help with stairs or ramp for entrance   Can travel by private vehicle     No  Equipment Recommendations  Wheelchair (measurements PT);Wheelchair cushion (measurements PT);Hoyer lift;Hospital bed       Precautions / Restrictions Precautions Precautions: Fall Recall of Precautions/Restrictions: Impaired Precaution/Restrictions Comments: LLE heel wound, Contact precs - ESBL Restrictions Weight Bearing Restrictions Per Provider Order: Yes LLE Weight Bearing Per Provider Order: Weight bearing as  tolerated Other Position/Activity Restrictions: WBAT on the LLE, PTA messaged Vascular surgeon Sheree 7/16 to ask if she needs a post-op shoe given heel wound     Mobility  Bed Mobility Overal bed mobility: Needs Assistance Bed Mobility: Supine to Sit, Sit to Supine     Supine to sit: Mod assist Sit to supine: Max assist   General bed mobility comments: Assist for LLE and trunk and use of bed pads required to advance hips    Transfers Overall transfer level: Needs assistance Equipment used: Rolling walker (2 wheels) Transfers: Sit to/from Stand Sit to Stand: Mod assist, Max assist           General transfer comment: Pt was able to stand at Max to Mod A from EOB 3x this session with Moderate wgt shift to the R and only minimal WB through the LLE due to pain at the hip and foot. Verbal cues for sequencing and hand placement for safety.    Ambulation/Gait   General Gait Details: Unable at this time.      Balance Overall balance assessment: Needs assistance Sitting-balance support: Bilateral upper extremity supported, Feet supported Sitting balance-Leahy Scale: Fair Sitting balance - Comments: continues with bil UE support and slight R lean Postural control: Right lateral lean Standing balance support: Bilateral upper extremity supported, During functional activity Standing balance-Leahy Scale: Poor Standing balance comment: reliant on external support and RW      Communication Communication Communication: No apparent difficulties  Cognition Arousal: Alert Behavior During Therapy: Anxious, WFL for tasks assessed/performed   PT - Cognitive impairments: Attention, Sequencing, Safety/Judgement     PT - Cognition Comments: Pt continues to be very  pain focused. Moderate cues for pursed-lip breathing throughout Following commands: Intact      Cueing Cueing Techniques: Verbal cues, Gestural cues, Tactile cues     General Comments General comments (skin integrity,  edema, etc.): BP elevated at end of session systolic in the 180's, daughter present      Pertinent Vitals/Pain Pain Assessment Pain Assessment: Faces Faces Pain Scale: Hurts whole lot Pain Location: Incision site on LLE and L heel where wound is located Pain Descriptors / Indicators: Aching, Grimacing, Guarding, Moaning Pain Intervention(s): Limited activity within patient's tolerance, Monitored during session, Premedicated before session, Repositioned     PT Goals (current goals can now be found in the care plan section) Acute Rehab PT Goals Patient Stated Goal: improve mobility before returning home. PT Goal Formulation: With patient Time For Goal Achievement: 06/12/24 Potential to Achieve Goals: Fair Progress towards PT goals: Progressing toward goals    Frequency    Min 2X/week      PT Plan  Continue with current POC        AM-PAC PT 6 Clicks Mobility   Outcome Measure  Help needed turning from your back to your side while in a flat bed without using bedrails?: A Lot Help needed moving from lying on your back to sitting on the side of a flat bed without using bedrails?: A Lot Help needed moving to and from a bed to a chair (including a wheelchair)?: A Lot Help needed standing up from a chair using your arms (e.g., wheelchair or bedside chair)?: A Lot Help needed to walk in hospital room?: Total Help needed climbing 3-5 steps with a railing? : Total 6 Click Score: 10    End of Session Equipment Utilized During Treatment: Gait belt Activity Tolerance: Patient limited by pain Patient left: in chair;with call bell/phone within reach;with chair alarm set;Other (comment) Nurse Communication: Mobility status;Need for lift equipment;Patient requests pain meds;Other (comment) PT Visit Diagnosis: Unsteadiness on feet (R26.81);Other abnormalities of gait and mobility (R26.89);Muscle weakness (generalized) (M62.81);Pain Pain - Right/Left: Left Pain - part of body:  Hip;Ankle and joints of foot     Time: 1133-1212 PT Time Calculation (min) (ACUTE ONLY): 39 min  Charges:    $Therapeutic Activity: 38-52 mins PT General Charges $$ ACUTE PT VISIT: 1 Visit                     Dorothyann Maier, DPT, CLT  Acute Rehabilitation Services Office: [phone] (Secure chat preferred)    Dorothyann VEAR Maier 06/03/2024, 12:52 PM

## 2024-06-04 DIAGNOSIS — S7292XA Unspecified fracture of left femur, initial encounter for closed fracture: Secondary | ICD-10-CM | POA: Diagnosis not present

## 2024-06-04 LAB — GLUCOSE, CAPILLARY
Glucose-Capillary: 183 mg/dL — ABNORMAL HIGH (ref 70–99)
Glucose-Capillary: 224 mg/dL — ABNORMAL HIGH (ref 70–99)
Glucose-Capillary: 223 mg/dL — ABNORMAL HIGH (ref 70–99)
Glucose-Capillary: 258 mg/dL — ABNORMAL HIGH (ref 70–99)

## 2024-06-04 MED ORDER — OXYCODONE HCL ER 20 MG PO T12A: 20.0000 mg | EXTENDED_RELEASE_TABLET | Freq: Two times a day (BID) | ORAL | 0 refills | Status: DC

## 2024-06-04 MED ORDER — CLOPIDOGREL BISULFATE 75 MG PO TABS: 75.0000 mg | ORAL_TABLET | Freq: Every day | ORAL | 0 refills | Status: AC

## 2024-06-04 NOTE — Progress Notes (Signed)
 PROGRESS NOTE    [name]  FMW:[id number] DOB: 12-15-1945 DOA: 05/26/2024 PCP: Waylan Almarie SAUNDERS, MD   Brief Narrative:  [name] is a 78 y.o. female with medical history significant for T2DM, HLD, HTN, cervical and rectal cancer s/p colostomy, PAD, diabetic foot and heel ulcer,, neuropathy, hypothyroidism, hypomagnesemia, anemia and arthritis who presented to the ED after a fall at home and found to have a moderately angulated midshaft fracture of the left femur.     At baseline, patient ambulates with a walker however recently, she has been losing strength in her legs. Patient has chronic lower extremity swelling and neuropathy but has had recent increased redness to her legs, L>R.  She she has had poor appetite but denies any dizziness, recent illness, fevers, chills, nausea or vomiting. She was recently treated for possible UTI about a week ago.   **Interim History: She was taken for surgical intervention and postoperatively she was very somnolent and had a new oxygen requirement.  We transferred her to the PCU and she was weaned off but then had to be placed back on Supplemental O2 via [place] when she was sleeping.  Has a diabetic foot ulcer and Underwent aortogram of the Bilaterl LE and had a Left Leg Run-off on 05/30/24. She had a L common iliac artery stent placed.  PT OT recommended CIR but per CIR, she is not candidate for CIR, SNF was recommended but family does not want SNF, now referral has been sent to LTAC.  Assessment & Plan:   Principal Problem:   Femur fracture, left (HCC) Active Problems:   Fall at home, initial encounter   Cellulitis of both lower extremities  Mechanical fall and  Left femur fracture s/p Fall:  Orthopedic surgery consulted and she is s/p ORIF now. Ortho recommending weightbearing as tolerated and reinforce the dressings as needed.  They have initiated on enoxaparin  for VTE prophylaxis.  PT OT recommended CIR but per CIR, she is not a candidate  for them and they recommended SNF but family wants LTAC which has been denied.  SNF is the next option which family will have to pursue.  TOC working with family.  Per TOC, they are starting insurance authorization today 06/04/2024.  Lower Extremity Cellulitis: Patient with chronic lower extremity edema found to have mild cellulitis of the lower extremities but now has surgical intervention and Leg is wrapper; C/w IV Cefazolin  1 gram po q8h for now which we will continue as long as she is inpatient.   Acute Respiratory Failure w/ Hypoxia: Post Op and ? Related to Anesthesia however was hypoxic overnight few nights ago and had to be placed back on Supplemental O2.  Has been weaned to room air and she is comfortable.  Could have been due to atelectasis.   Type 2 Diabetes Mellitus complicated by Neuropathy:  Last A1c 6.5% 3 months ago.  PTA medications metformin , which is on hold.  Blood sugar elevated and labile.  Realized that she is on cardiac diet instead of diabetic diet.  Transitioned her to diabetic diet.  Will continue SSI.   Diabetic Foot Ulcer / Nonhealing pressure ulcer/chronic foot pain:  Patient with full-thickness ulcer at the posterior left heel with large eschar (see media tab);  No significant improvement in left heel ulcer over the last few weeks.  Followed by podiatry and wound care. Wound care consulted, appreciate recs; See below for LLE Common iliac Intervention. Will likely need Podiatry/Orth to evaluate for debridement once she  heals from her Hip Sx. The C is recommending IR team providing the patient a healing shoe from the [employer] and has been placed orders.  I increased her long-acting oxycodone  to 20 mg twice daily on the afternoon of 06/01/2024 and since then, her pain appears to be well-controlled and she has not required any as needed oxycodone  in the last 24 hours.   PAD: ABIs 02/16/2024 are 0.85 on the right biphasic and 1.01 on the left biphasic.  Follows vascular surgery as  outpatient.  Vascular surgery consulted by Ortho in regards to possible intervention during this hospitalization; Underwent Aortogram/Arteriogram of Bilaterl LE and had stenting of the L Common iliac Artery and was given Plavix  load; Now on Clopidogrel  75 mg po daily w/ Breakfast -Continue Atorvastatin  as below   Hydronephrosis: Improving CT A/P 5 days ago showed bilateral hydronephrosis, R>L but no visible obstructing stone; Hydronephrosis thought to be due to patient's rectal cancer. Checked Renal U/S and showed  Mild right hydronephrosis which hasimproved since prior CT examination and Interval resolution of left Hydronephrosis. There was also Moderate right and mild left renal cortical atrophy. Will hold off Urological Consult given her improvement    Chronic Hypomagnesemia:  Unclear etiology, receives intermittent IV magnesium  by cancer center.  Magnesium  1.5 yesterday, will replenish.  Recheck in the morning.   Essential HTN:  BP very well-controlled, remains on IV as needed hydralazine .   Cervical Cancer / Rectal cancer s/p colostomy: Followed closely with Oncology, patient has declined further chemotherapy or surgery for removal of the remaining tumor.  Continue Vitamin Supplementation   Normocytic Anemia / IDA:  Baseline 8-9; workup indicates iron deficiency anemia.  Continue iron supplementation w/ Ferrous Sulfate  325 mg po MWF.    Chronic BLE edema: Thought to be due to lymphedema;  Apply TED hose however her left leg is now wrapped from her surgery   HLD: C/w Atorvastatin  40 mg.   Hypothyroidism: C/w Levothyroxine  137 mcg po Daily    GERD/GI Prophylaxis: Continue home PPI w/ Pantoprazole  40 mg po Daily   Mood Disorder:  Continue Sertraline  150 mg po Daily   Constipation: Resolved.  Continue bowel regimen.   Perineal/groin and Buttock Wounds: WOC Nurse Consult as they are bleeding some   Class I Obesity: Complicates overall prognosis and care. Estimated body mass index is 32.12  kg/m as calculated from the following:   Height as of this encounter: 5' (1.524 m).   Weight as of this encounter: 74.6 kg. Weight Loss and Dietary Counseling given   DVT prophylaxis: Place TED hose Start: 05/26/24 2205 SCDs Start: 05/26/24 1943   Code Status: Full Code  Family Communication: None present at bedside.  Plan of care discussed with patient in length and he/she verbalized understanding and agreed with it.  Discussed with her daughter over the speaker phone while I was still in her room.  Status is: Inpatient Remains inpatient appropriate because: Medically stable since 06/01/2024, pending placement to SNF   Estimated body mass index is 32.12 kg/m as calculated from the following:   Height as of this encounter: 5' (1.524 m).   Weight as of this encounter: 74.6 kg.    Nutritional Assessment: Body mass index is 32.12 kg/m.SABRA Seen by dietician.  I agree with the assessment and plan as outlined below: Nutrition Status:        . Skin Assessment: I have examined the patient's skin and I agree with the wound assessment as performed by the wound care RN as  outlined below:    Consultants:  Vascular surgery and orthopedics  Procedures:  As above  Antimicrobials:  Anti-infectives (From admission, onward)    Start     Dose/Rate Route Frequency Ordered Stop   05/27/24 1410  vancomycin  (VANCOCIN ) powder  Status:  Discontinued          As needed 05/27/24 1410 05/27/24 1528   05/26/24 2245  ceFAZolin  (ANCEF ) IVPB 1 g/50 mL premix        1 g 100 mL/hr over 30 Minutes Intravenous Every 8 hours 05/26/24 2150           Subjective: Seen and examined, feels very well, happy that her pain is controlled.  No other complaint.  Objective: Vitals:   06/03/24 2325 06/04/24 0317 06/04/24 0725 06/04/24 1221  BP: (!) 144/35 (!) 145/40 (!) 143/37 (!) 136/122  Pulse:  (!) 59 (!) 58 71  Resp: 16 12 (!) 21 17  Temp: 98.3 F (36.8 C) 98.1 F (36.7 C) 98 F (36.7 C) 98.3 F  (36.8 C)  TempSrc: Oral Oral Oral Oral  SpO2:  94% 98% 98%  Weight:      Height:        Intake/Output Summary (Last 24 hours) at 06/04/2024 1233 Last data filed at 06/04/2024 0643 Gross per 24 hour  Intake 3 ml  Output 1600 ml  Net -1597 ml   Filed Weights   05/27/24 1130 05/28/24 0353 05/30/24 0352  Weight: 70 kg 73.9 kg 74.6 kg    Examination:  General exam: Appears calm and comfortable  Respiratory system: Clear to auscultation. Respiratory effort normal. Cardiovascular system: S1 & S2 heard, RRR. No JVD, murmurs, rubs, gallops or clicks. No pedal edema. Gastrointestinal system: Abdomen is nondistended, soft and nontender. No organomegaly or masses felt. Normal bowel sounds heard. Central nervous system: Alert and oriented. No focal neurological deficits. Extremities: Symmetric 5 x 5 power. Skin: No rashes, lesions or ulcers.  Psychiatry: Judgement and insight appear normal. Mood & affect appropriate.    Data Reviewed: I have personally reviewed following labs and imaging studies  CBC: Recent Labs  Lab 05/30/24 0330 05/31/24 0350 06/01/24 0916 06/02/24 0425 06/03/24 0500  WBC 7.2 7.8 8.5 6.3 6.6  NEUTROABS 5.6 6.3 6.9 4.7 4.9  HGB 9.7* 8.9* 9.7* 8.4* 8.9*  HCT 32.0* 30.3* 32.1* 28.0* 29.7*  MCV 86.5 88.1 85.8 87.0 88.1  PLT 155 179 210 189 223   Basic Metabolic Panel: Recent Labs  Lab 05/29/24 1146 05/30/24 0330 05/31/24 0350 06/01/24 0915 06/01/24 0916 06/02/24 0425 06/03/24 0500  NA 139 139 139  --  138 138  --   K 4.6 4.3 4.3  --  4.4 4.4  --   CL 103 104 103  --  102 102  --   CO2 23 27 26   --  26 28  --   GLUCOSE 247* 147* 136*  --  224* 181*  --   BUN 19 20 25*  --  18 19  --   CREATININE 0.94 0.85 1.09*  --  0.82 0.80  --   CALCIUM  8.3* 8.3* 8.2*  --  8.6* 8.3*  --   MG 1.3* 1.8 1.6* 1.5*  --   --  1.7  PHOS 2.6 3.2 3.6  --   --   --   --    GFR: Estimated Creatinine Clearance: 52.2 mL/min (by C-G formula based on SCr of 0.8  mg/dL). Liver Function Tests: Recent Labs  Lab 05/29/24 1146 05/30/24  0330 05/31/24 0350  AST 22 21 27   ALT 10 10 10   ALKPHOS 106 103 129*  BILITOT 0.6 0.6 0.6  PROT 5.3* 4.9* 4.7*  ALBUMIN 2.1* 1.9* 1.7*   No results for input(s): LIPASE, AMYLASE in the last 168 hours. No results for input(s): AMMONIA in the last 168 hours. Coagulation Profile: No results for input(s): INR, PROTIME in the last 168 hours.  Cardiac Enzymes: No results for input(s): CKTOTAL, CKMB, CKMBINDEX, TROPONINI in the last 168 hours. BNP (last 3 results) No results for input(s): PROBNP in the last 8760 hours. HbA1C: No results for input(s): HGBA1C in the last 72 hours. CBG: Recent Labs  Lab 06/03/24 0606 06/03/24 1202 06/03/24 1629 06/03/24 2038 06/04/24 0601  GLUCAP 203* 289* 306* 123* 183*   Lipid Profile: No results for input(s): CHOL, HDL, LDLCALC, TRIG, CHOLHDL, LDLDIRECT in the last 72 hours.  Thyroid  Function Tests: No results for input(s): TSH, T4TOTAL, FREET4, T3FREE, THYROIDAB in the last 72 hours. Anemia Panel: No results for input(s): VITAMINB12, FOLATE, FERRITIN, TIBC, IRON, RETICCTPCT in the last 72 hours. Sepsis Labs: No results for input(s): PROCALCITON, LATICACIDVEN in the last 168 hours.  Recent Results (from the past 240 hours)  Surgical pcr screen     Status: None   Collection Time: 05/27/24 11:08 AM   Specimen: Nasal Mucosa; Nasal Swab  Result Value Ref Range Status   MRSA, PCR NEGATIVE NEGATIVE Final   Staphylococcus aureus NEGATIVE NEGATIVE Final    Comment: (NOTE) The Xpert SA Assay (FDA approved for NASAL specimens in patients 55 years of age and older), is one component of a comprehensive surveillance program. It is not intended to diagnose infection nor to guide or monitor treatment. Performed at [facility], [address], [place], [place] [address]      Radiology Studies: No results  found.  Scheduled Meds:  atorvastatin   40 mg Oral QHS   baclofen   10 mg Oral BID   Chlorhexidine  Gluconate Cloth  6 each Topical Daily   cholecalciferol   2,000 Units Oral Daily   clopidogrel   75 mg Oral Q breakfast   clotrimazole    Topical Daily   cyanocobalamin   1,000 mcg Oral Daily   feeding supplement  237 mL Oral TID BM   ferrous sulfate   325 mg Oral Q M,W,F   gabapentin   300 mg Oral QHS   gabapentin   600 mg Oral Daily   guaiFENesin   1,200 mg Oral BID   insulin  aspart  0-20 Units Subcutaneous TID WC   insulin  aspart  0-5 Units Subcutaneous QHS   leptospermum manuka honey  1 Application Topical Daily   levothyroxine   137 mcg Oral Q0600   multivitamin with minerals  1 tablet Oral Q breakfast   oxyCODONE   20 mg Oral Q12H   pantoprazole   40 mg Oral QAC breakfast   polyethylene glycol  17 g Oral BID   sertraline   150 mg Oral QHS   sodium chloride  flush  3 mL Intravenous Q12H   Continuous Infusions:   ceFAZolin  (ANCEF ) IV 1 g (06/04/24 0641)     LOS: 9 days   Fredia Skeeter, MD [facility]  06/04/2024, 12:33 PM   *Please note that this is a verbal dictation therefore any spelling or grammatical errors are due to the Dragon Medical One system interpretation.  Please page via Amion and do not message via secure chat for urgent patient care matters. Secure chat can be used for non urgent patient care matters.  How  to contact the [facility] Attending or Consulting provider 7A - 7P or covering provider during after hours 7P -7A, for this patient?  Check the care team in [facility] and look for a) attending/consulting TRH provider listed and b) the TR[facility] team listed. Page or secure chat 7A-7P. Log into [email or web address] and use [facility] universal password to access. If you do not have the password, please contact the hospital operator. Locate the [facility] provider you are looking for under [facility] and page to a number that you can be directly reached. If you still have difficulty  reaching the provider, please page the [facility] (Director on Call) for the Hospitalists listed on amion for assistance.

## 2024-06-04 NOTE — TOC Progression Note (Signed)
 Transition of Care [facility]) - Progression Note    Patient Details  Name: [name] MRN: [id number] Date of Birth: June 11, 1946  Transition of Care [facility]) CM/SW Contact  Iolani Twilley, [place], [place] Phone Number: 06/04/2024, 2:56 PM  Clinical Narrative:     Bed offers provide to patient and her daughter. Per patient's daughter, she will review for choice.  Lakeysha Slutsky, LCSW Transition of Care    Expected Discharge Plan: Long Term Acute Care (LTAC) Barriers to Discharge: Insurance Authorization  Expected Discharge Plan and Services   Discharge Planning Services: CM Consult Post Acute Care Choice: Long Term Acute Care (LTAC) Living arrangements for the past 2 months: Single Family Home                             HH Agency: [facility]         Social Determinants of Health (SDOH) Interventions SDOH Screenings   Food Insecurity: No Food Insecurity (05/27/2024)  Housing: Low Risk  (05/27/2024)  Transportation Needs: No Transportation Needs (05/27/2024)  Utilities: Not At Risk (05/27/2024)  Depression (PHQ2-9): Low Risk  (01/17/2020)  Social Connections: Moderately Integrated (05/27/2024)  Tobacco Use: Medium Risk (05/27/2024)    Readmission Risk Interventions    10/15/2023    9:49 AM 09/11/2023   11:36 AM 12/02/2021    1:55 PM  Readmission Risk Prevention Plan  Transportation Screening Complete Complete Complete  PCP or Specialist Appt within 3-5 Days  Complete   HRI or Home Care Consult  Complete   Social Work Consult for Recovery Care Planning/Counseling  Complete   Palliative Care Screening  Not Applicable   Medication Review Oceanographer) Referral to Pharmacy Complete Complete  PCP or Specialist appointment within 3-5 days of discharge Complete  Complete  HRI or Home Care Consult Complete  Complete  SW Recovery Care/Counseling Consult Complete  Complete  Palliative Care Screening Not Applicable  Not Applicable  Skilled Nursing Facility Not  Applicable  Patient Refused

## 2024-06-04 NOTE — Plan of Care (Signed)

## 2024-06-05 DIAGNOSIS — S7292XA Unspecified fracture of left femur, initial encounter for closed fracture: Secondary | ICD-10-CM | POA: Diagnosis not present

## 2024-06-05 LAB — BASIC METABOLIC PANEL WITH GFR
Anion gap: 9 (ref 5–15)
BUN: 21 mg/dL (ref 8–23)
CO2: 27 mmol/L (ref 22–32)
Calcium: 8.2 mg/dL — ABNORMAL LOW (ref 8.9–10.3)
Chloride: 103 mmol/L (ref 98–111)
Creatinine, Ser: 0.74 mg/dL (ref 0.44–1.00)
GFR, Estimated: >60 mL/min (ref >60)
Glucose, Bld: 201 mg/dL — ABNORMAL HIGH (ref 70–99)
Potassium: 4.1 mmol/L (ref 3.5–5.1)
Sodium: 139 mmol/L (ref 135–145)

## 2024-06-05 LAB — CBC WITH DIFFERENTIAL/PLATELET
Abs Immature Granulocytes: 0.09 K/uL — ABNORMAL HIGH (ref 0.00–0.07)
Basophils Absolute: 0 K/uL (ref 0.0–0.1)
Basophils Relative: 0 %
Eosinophils Absolute: 0.1 K/uL (ref 0.0–0.5)
Eosinophils Relative: 2 %
HCT: 27.5 % — ABNORMAL LOW (ref 36.0–46.0)
Hemoglobin: 8.2 g/dL — ABNORMAL LOW (ref 12.0–15.0)
Immature Granulocytes: 1 %
Lymphocytes Relative: 16 %
Lymphs Abs: 1.1 K/uL (ref 0.7–4.0)
MCH: 26.1 pg (ref 26.0–34.0)
MCHC: 29.8 g/dL — ABNORMAL LOW (ref 30.0–36.0)
MCV: 87.6 fL (ref 80.0–100.0)
Monocytes Absolute: 0.6 K/uL (ref 0.1–1.0)
Monocytes Relative: 9 %
Neutro Abs: 4.8 K/uL (ref 1.7–7.7)
Neutrophils Relative %: 72 %
Platelets: 239 K/uL (ref 150–400)
RBC: 3.14 MIL/uL — ABNORMAL LOW (ref 3.87–5.11)
RDW: 16.6 % — ABNORMAL HIGH (ref 11.5–15.5)
WBC: 6.8 K/uL (ref 4.0–10.5)
nRBC: 0 % (ref 0.0–0.2)

## 2024-06-05 LAB — GLUCOSE, CAPILLARY
Glucose-Capillary: 206 mg/dL — ABNORMAL HIGH (ref 70–99)
Glucose-Capillary: 238 mg/dL — ABNORMAL HIGH (ref 70–99)
Glucose-Capillary: 227 mg/dL — ABNORMAL HIGH (ref 70–99)
Glucose-Capillary: 240 mg/dL — ABNORMAL HIGH (ref 70–99)

## 2024-06-05 LAB — MAGNESIUM: Magnesium: 1.3 mg/dL — ABNORMAL LOW (ref 1.7–2.4)

## 2024-06-05 MED ORDER — MAGNESIUM OXIDE -MG SUPPLEMENT 400 (240 MG) MG PO TABS: 400.0000 mg | ORAL_TABLET | Freq: Two times a day (BID) | ORAL | Status: DC

## 2024-06-05 MED ORDER — MAGNESIUM OXIDE -MG SUPPLEMENT 400 (240 MG) MG PO TABS
400.0000 mg | ORAL_TABLET | Freq: Two times a day (BID) | ORAL | Status: DC
  Administered 2024-06-06 – 2024-06-08 (×5): 400 mg via ORAL
  Filled 2024-06-05 (×5): qty 1

## 2024-06-05 MED ORDER — INSULIN GLARGINE-YFGN 100 UNIT/ML ~~LOC~~ SOLN
10.0000 [IU] | Freq: Every day | SUBCUTANEOUS | Status: DC
  Administered 2024-06-05: 10 [IU] via SUBCUTANEOUS
  Filled 2024-06-05 (×2): qty 0.1

## 2024-06-05 MED ORDER — INSULIN GLARGINE 100 UNITS/ML SOLOSTAR PEN
10.0000 [IU] | PEN_INJECTOR | Freq: Every day | SUBCUTANEOUS | Status: DC
  Filled 2024-06-05: qty 3

## 2024-06-05 MED ORDER — MAGNESIUM SULFATE 4 GM/100ML IV SOLN
4.0000 g | Freq: Once | INTRAVENOUS | Status: AC
  Administered 2024-06-05: 4 g via INTRAVENOUS
  Filled 2024-06-05: qty 100

## 2024-06-05 NOTE — TOC Progression Note (Signed)
 Transition of Care [facility]) - Progression Note    Patient Details  Name: [name] MRN: [id number] Date of Birth: 1946/07/10  Transition of Care [facility] CM/SW Contact  [address], [place], [place] Phone Number: 06/05/2024, 2:25 PM  Clinical Narrative:    Follow up phone to patient's daughter regarding SNF choice. Patient's daughter reluctant for patient to discharge to a facility and is leaning towards home with home health services. Per patient's daughter, patient had [facility] prior to admission and plans to contact them in the morning to discuss resuming care.  Karrah Mangini, LCSW Transition of Care    Expected Discharge Plan: Long Term Acute Care (LTAC) Barriers to Discharge: Insurance Authorization  Expected Discharge Plan and Services   Discharge Planning Services: CM Consult Post Acute Care Choice: Long Term Acute Care (LTAC) Living arrangements for the past 2 months: Single Family Home                             HH Agency: [facility]         Social Determinants of Health (SDOH) Interventions SDOH Screenings   Food Insecurity: No Food Insecurity (05/27/2024)  Housing: Low Risk  (05/27/2024)  Transportation Needs: No Transportation Needs (05/27/2024)  Utilities: Not At Risk (05/27/2024)  Depression (PHQ2-9): Low Risk  (01/17/2020)  Social Connections: Moderately Integrated (05/27/2024)  Tobacco Use: Medium Risk (05/27/2024)    Readmission Risk Interventions    10/15/2023    9:49 AM 09/11/2023   11:36 AM 12/02/2021    1:55 PM  Readmission Risk Prevention Plan  Transportation Screening Complete Complete Complete  PCP or Specialist Appt within 3-5 Days  Complete   HRI or Home Care Consult  Complete   Social Work Consult for Recovery Care Planning/Counseling  Complete   Palliative Care Screening  Not Applicable   Medication Review [facility]) Referral to Pharmacy Complete Complete  PCP or Specialist appointment within 3-5 days of  discharge Complete  Complete  HRI or Home Care Consult Complete  Complete  SW Recovery Care/Counseling Consult Complete  Complete  Palliative Care Screening Not Applicable  Not Applicable  Skilled Nursing Facility Not Applicable  Patient Refused

## 2024-06-05 NOTE — Progress Notes (Addendum)
 PROGRESS NOTE    [name]  FMW:[id number] DOB: [birth year] DOA: 05/26/2024 PCP: Waylan Almarie SAUNDERS, MD   Brief Narrative:  [name] is a 78 y.o. female with medical history significant for T2DM, HLD, HTN, cervical and rectal cancer s/p colostomy, PAD, diabetic foot and heel ulcer,, neuropathy, hypothyroidism, hypomagnesemia, anemia and arthritis who presented to the ED after a fall at home and found to have a moderately angulated midshaft fracture of the left femur.     At baseline, patient ambulates with a walker however recently, she has been losing strength in her legs. Patient has chronic lower extremity swelling and neuropathy but has had recent increased redness to her legs, L>R.  She she has had poor appetite but denies any dizziness, recent illness, fevers, chills, nausea or vomiting. She was recently treated for possible UTI about a week ago.   **Interim History: She was taken for surgical intervention and postoperatively she was very somnolent and had a new oxygen requirement.  We transferred her to the PCU and she was weaned off but then had to be placed back on Supplemental O2 via [place] when she was sleeping.  Has a diabetic foot ulcer and Underwent aortogram of the Bilaterl LE and had a Left Leg Run-off on 05/30/24. She had a L common iliac artery stent placed.  PT OT recommended CIR but per CIR, she is not candidate for CIR, SNF was recommended but family does not want SNF, now referral has been sent to LTAC.  Assessment & Plan:   Principal Problem:   Femur fracture, left (HCC) Active Problems:   Fall at home, initial encounter   Cellulitis of both lower extremities  Mechanical fall and  Left femur fracture s/p Fall:  Orthopedic surgery consulted and she is s/p ORIF now. Ortho recommending weightbearing as tolerated and reinforce the dressings as needed.  They have initiated on enoxaparin  for VTE prophylaxis.  PT OT recommended CIR but per CIR, she is not a candidate  for them and they recommended SNF but family wants LTAC which has been denied.  SNF is the next option which family will have to pursue.  TOC working with family.  Per TOC, they are starting insurance authorization today 06/04/2024.  Lower Extremity Cellulitis: Patient with chronic lower extremity edema found to have mild cellulitis of the lower extremities but now has surgical intervention and Leg is wrapper; C/w IV Cefazolin  1 gram po q8h for now which we will continue as long as she is inpatient.   Acute Respiratory Failure w/ Hypoxia: Post Op and ? Related to Anesthesia however was hypoxic overnight few nights ago and had to be placed back on Supplemental O2.  Has been weaned to room air and she is comfortable.  Could have been due to atelectasis.   Type 2 Diabetes Mellitus complicated by Neuropathy:  Last A1c 6.5% 3 months ago.  PTA medications metformin , which is on hold.  Blood sugar elevated in 200s range.  Will start on Lantus  10 units.  Continue SSI.   Diabetic Foot Ulcer / Nonhealing pressure ulcer/chronic foot pain:  Patient with full-thickness ulcer at the posterior left heel with large eschar (see media tab);  No significant improvement in left heel ulcer over the last few weeks.  Followed by podiatry and wound care. Wound care consulted, appreciate recs; See below for LLE Common iliac Intervention. Will likely need Podiatry/Orth to evaluate for debridement once she heals from her Hip Sx. The C is recommending IR team  providing the patient a healing shoe from the [employer] and has been placed orders.  I increased her long-acting oxycodone  to 20 mg twice daily on the afternoon of 06/01/2024 and since then, her pain appears to be well-controlled and she has not required any as needed oxycodone  in the last 2 to 3 days.   PAD: ABIs 02/16/2024 are 0.85 on the right biphasic and 1.01 on the left biphasic.  Follows vascular surgery as outpatient.  Vascular surgery consulted by Ortho in regards to possible  intervention during this hospitalization; Underwent Aortogram/Arteriogram of Bilaterl LE and had stenting of the L Common iliac Artery and was given Plavix  load; Now on Clopidogrel  75 mg po daily w/ Breakfast -Continue Atorvastatin  as below   Hydronephrosis: Improving CT A/P 5 days ago showed bilateral hydronephrosis, R>L but no visible obstructing stone; Hydronephrosis thought to be due to patient's rectal cancer. Checked Renal U/S and showed  Mild right hydronephrosis which hasimproved since prior CT examination and Interval resolution of left Hydronephrosis. There was also Moderate right and mild left renal cortical atrophy. Will hold off Urological Consult given her improvement    Chronic Hypomagnesemia:  Unclear etiology, receives intermittent IV magnesium  by cancer center.  Magnesium  down to 1.3 again today.  Will replenish.   Essential HTN:  BP very well-controlled, remains on IV as needed hydralazine .   Cervical Cancer / Rectal cancer s/p colostomy: Followed closely with Oncology, patient has declined further chemotherapy or surgery for removal of the remaining tumor.  Continue Vitamin Supplementation   Normocytic Anemia / IDA:  Baseline 8-9; workup indicates iron deficiency anemia.  Continue iron supplementation w/ Ferrous Sulfate  325 mg po MWF.    Chronic BLE edema: Thought to be due to lymphedema;  Apply TED hose however her left leg is now wrapped from her surgery   HLD: C/w Atorvastatin  40 mg.   Hypothyroidism: C/w Levothyroxine  137 mcg po Daily    GERD/GI Prophylaxis: Continue home PPI w/ Pantoprazole  40 mg po Daily   Mood Disorder:  Continue Sertraline  150 mg po Daily   Constipation: Resolved.  Continue bowel regimen.   Perineal/groin and Buttock Wounds: WOC Nurse Consulted.  No more issues for last few days.   Class I Obesity: Complicates overall prognosis and care. Estimated body mass index is 32.12 kg/m as calculated from the following:   Height as of this encounter: 5'  (1.524 m).   Weight as of this encounter: 74.6 kg. Weight Loss and Dietary Counseling given   DVT prophylaxis: Place TED hose Start: 05/26/24 2205 SCDs Start: 05/26/24 1943   Code Status: Full Code  Family Communication: None present at bedside.  Plan of care discussed with patient in length and he/she verbalized understanding and agreed with it.  Discussed with her daughter over the speaker phone while I was still in her room.  Status is: Inpatient Remains inpatient appropriate because: Medically stable since 06/01/2024, pending placement to SNF   Estimated body mass index is 32.12 kg/m as calculated from the following:   Height as of this encounter: 5' (1.524 m).   Weight as of this encounter: 74.6 kg.    Nutritional Assessment: Body mass index is 32.12 kg/m.[name] Seen by dietician.  I agree with the assessment and plan as outlined below: Nutrition Status:        . Skin Assessment: I have examined the patient's skin and I agree with the wound assessment as performed by the wound care RN as outlined below:    Consultants:  Vascular surgery and orthopedics  Procedures:  As above  Antimicrobials:  Anti-infectives (From admission, onward)    Start     Dose/Rate Route Frequency Ordered Stop   05/27/24 1410  vancomycin  (VANCOCIN ) powder  Status:  Discontinued          As needed 05/27/24 1410 05/27/24 1528   05/26/24 2245  ceFAZolin  (ANCEF ) IVPB 1 g/50 mL premix        1 g 100 mL/hr over 30 Minutes Intravenous Every 8 hours 05/26/24 2150           Subjective: Patient seen and examined.  No complaints.  Pain very well-controlled.  Objective: Vitals:   06/04/24 2329 06/05/24 0330 06/05/24 0811 06/05/24 1141  BP: (!) 135/44 (!) 131/47 (!) 121/47 127/81  Pulse: 65 62 62 66  Resp: 20 20 18 10   Temp: 98.5 F (36.9 C) 98.3 F (36.8 C) 98.2 F (36.8 C) 97.7 F (36.5 C)  TempSrc: Oral Oral Oral Oral  SpO2: 93% 94% 100% 95%  Weight:      Height:         Intake/Output Summary (Last 24 hours) at 06/05/2024 1343 Last data filed at 06/05/2024 9394 Gross per 24 hour  Intake 520 ml  Output 400 ml  Net 120 ml   Filed Weights   05/27/24 1130 05/28/24 0353 05/30/24 0352  Weight: 70 kg 73.9 kg 74.6 kg    Examination:  General exam: Appears calm and comfortable  Respiratory system: Clear to auscultation. Respiratory effort normal. Cardiovascular system: S1 & S2 heard, RRR. No JVD, murmurs, rubs, gallops or clicks. No pedal edema. Gastrointestinal system: Abdomen is nondistended, soft and nontender. No organomegaly or masses felt. Normal bowel sounds heard. Central nervous system: Alert and oriented. No focal neurological deficits. Extremities: Symmetric 5 x 5 power. Skin: No rashes, lesions or ulcers.  Psychiatry: Judgement and insight appear normal. Mood & affect appropriate.    Data Reviewed: I have personally reviewed following labs and imaging studies  CBC: Recent Labs  Lab 05/31/24 0350 06/01/24 0916 06/02/24 0425 06/03/24 0500 06/05/24 0500  WBC 7.8 8.5 6.3 6.6 6.8  NEUTROABS 6.3 6.9 4.7 4.9 4.8  HGB 8.9* 9.7* 8.4* 8.9* 8.2*  HCT 30.3* 32.1* 28.0* 29.7* 27.5*  MCV 88.1 85.8 87.0 88.1 87.6  PLT 179 210 189 223 239   Basic Metabolic Panel: Recent Labs  Lab 05/30/24 0330 05/31/24 0350 06/01/24 0915 06/01/24 0916 06/02/24 0425 06/03/24 0500 06/05/24 0500  NA 139 139  --  138 138  --  139  K 4.3 4.3  --  4.4 4.4  --  4.1  CL 104 103  --  102 102  --  103  CO2 27 26  --  26 28  --  27  GLUCOSE 147* 136*  --  224* 181*  --  201*  BUN 20 25*  --  18 19  --  21  CREATININE 0.85 1.09*  --  0.82 0.80  --  0.74  CALCIUM  8.3* 8.2*  --  8.6* 8.3*  --  8.2*  MG 1.8 1.6* 1.5*  --   --  1.7 1.3*  PHOS 3.2 3.6  --   --   --   --   --    GFR: Estimated Creatinine Clearance: 52.2 mL/min (by C-G formula based on SCr of 0.74 mg/dL). Liver Function Tests: Recent Labs  Lab 05/30/24 0330 05/31/24 0350  AST 21 27  ALT 10  10  ALKPHOS 103 129*  BILITOT 0.6 0.6  PROT 4.9* 4.7*  ALBUMIN 1.9* 1.7*   No results for input(s): LIPASE, AMYLASE in the last 168 hours. No results for input(s): AMMONIA in the last 168 hours. Coagulation Profile: No results for input(s): INR, PROTIME in the last 168 hours.  Cardiac Enzymes: No results for input(s): CKTOTAL, CKMB, CKMBINDEX, TROPONINI in the last 168 hours. BNP (last 3 results) No results for input(s): PROBNP in the last 8760 hours. HbA1C: No results for input(s): HGBA1C in the last 72 hours. CBG: Recent Labs  Lab 06/04/24 1258 06/04/24 1638 06/04/24 2101 06/05/24 0628 06/05/24 1140  GLUCAP 224* 223* 258* 206* 238*   Lipid Profile: No results for input(s): CHOL, HDL, LDLCALC, TRIG, CHOLHDL, LDLDIRECT in the last 72 hours.  Thyroid  Function Tests: No results for input(s): TSH, T4TOTAL, FREET4, T3FREE, THYROIDAB in the last 72 hours. Anemia Panel: No results for input(s): VITAMINB12, FOLATE, FERRITIN, TIBC, IRON, RETICCTPCT in the last 72 hours. Sepsis Labs: No results for input(s): PROCALCITON, LATICACIDVEN in the last 168 hours.  Recent Results (from the past 240 hours)  Surgical pcr screen     Status: None   Collection Time: 05/27/24 11:08 AM   Specimen: Nasal Mucosa; Nasal Swab  Result Value Ref Range Status   MRSA, PCR NEGATIVE NEGATIVE Final   Staphylococcus aureus NEGATIVE NEGATIVE Final    Comment: (NOTE) The Xpert SA Assay (FDA approved for NASAL specimens in patients 94 years of age and older), is one component of a comprehensive surveillance program. It is not intended to diagnose infection nor to guide or monitor treatment. Performed at [facility], [address] [place], [place] [address]      Radiology Studies: No results found.  Scheduled Meds:  atorvastatin   40 mg Oral QHS   baclofen   10 mg Oral BID   Chlorhexidine  Gluconate Cloth  6 each Topical Daily    cholecalciferol   2,000 Units Oral Daily   clopidogrel   75 mg Oral Q breakfast   clotrimazole    Topical Daily   cyanocobalamin   1,000 mcg Oral Daily   feeding supplement  237 mL Oral TID BM   ferrous sulfate   325 mg Oral Q M,W,F   gabapentin   300 mg Oral QHS   gabapentin   600 mg Oral Daily   guaiFENesin   1,200 mg Oral BID   insulin  aspart  0-20 Units Subcutaneous TID WC   insulin  aspart  0-5 Units Subcutaneous QHS   leptospermum manuka honey  1 Application Topical Daily   levothyroxine   137 mcg Oral Q0600   multivitamin with minerals  1 tablet Oral Q breakfast   oxyCODONE   20 mg Oral Q12H   pantoprazole   40 mg Oral QAC breakfast   polyethylene glycol  17 g Oral BID   sertraline   150 mg Oral QHS   sodium chloride  flush  3 mL Intravenous Q12H   Continuous Infusions:   ceFAZolin  (ANCEF ) IV 1 g (06/05/24 1308)     LOS: 10 days   Fredia Skeeter, MD [facility]  06/05/2024, 1:43 PM   *Please note that this is a verbal dictation therefore any spelling or grammatical errors are due to the Dragon Medical One system interpretation.  Please page via Amion and do not message via secure chat for urgent patient care matters. Secure chat can be used for non urgent patient care matters.  How to contact the [facility] Attending or Consulting provider 7A - 7P or covering provider during after hours 7P -7A, for this patient?  Check the care team in [facility] and look for a) attending/consulting TR[facility] provider listed and b) the [facility] team listed. Page or secure chat 7A-7P. Log into [email or web address] and use [facility] universal password to access. If you do not have the password, please contact the hospital operator. Locate the [facility] provider you are looking for under [facility] and page to a number that you can be directly reached. If you still have difficulty reaching the provider, please page the [facility] (Director on Call) for the Hospitalists listed on amion for assistance.

## 2024-06-06 DIAGNOSIS — S7292XA Unspecified fracture of left femur, initial encounter for closed fracture: Secondary | ICD-10-CM | POA: Diagnosis not present

## 2024-06-06 LAB — GLUCOSE, CAPILLARY
Glucose-Capillary: 189 mg/dL — ABNORMAL HIGH (ref 70–99)
Glucose-Capillary: 283 mg/dL — ABNORMAL HIGH (ref 70–99)
Glucose-Capillary: 380 mg/dL — ABNORMAL HIGH (ref 70–99)
Glucose-Capillary: 100 mg/dL — ABNORMAL HIGH (ref 70–99)

## 2024-06-06 MED ORDER — INSULIN GLARGINE-YFGN 100 UNIT/ML ~~LOC~~ SOLN
15.0000 [IU] | Freq: Every day | SUBCUTANEOUS | Status: DC
  Administered 2024-06-06 – 2024-06-08 (×3): 15 [IU] via SUBCUTANEOUS
  Filled 2024-06-06 (×3): qty 0.15

## 2024-06-06 NOTE — Progress Notes (Signed)
 Mobility Specialist Progress Note:   06/06/24 1027  Mobility  Activity Transferred from bed to chair  Level of Assistance Moderate assist, patient does 50-74% (+2)  Assistive Device Stedy  LLE Weight Bearing Per Provider Order WBAT  Activity Response Tolerated well  Mobility Referral Yes  Mobility visit 1 Mobility  Mobility Specialist Start Time (ACUTE ONLY) 1027  Mobility Specialist Stop Time (ACUTE ONLY) 1043  Mobility Specialist Time Calculation (min) (ACUTE ONLY) 16 min   Pt received in bed, agreeable to mobility session. Required ModA+2 to stand with Stedy. Pt reporting incredible pain. RN notified. Pt required breathing cues during session to prevent hyperventilation. Sitting up in chair with all needs met, call bell in reach.    [name] Mobility Specialist Please contact via Special educational needs teacher or  Rehab office at [phone]

## 2024-06-06 NOTE — Progress Notes (Signed)
 PROGRESS NOTE    [name]  FMW:[id number] DOB: [birth year] DOA: 05/26/2024 PCP: Waylan Almarie SAUNDERS, MD   Brief Narrative:  [name] is a 78 y.o. female with medical history significant for T2DM, HLD, HTN, cervical and rectal cancer s/p colostomy, PAD, diabetic foot and heel ulcer,, neuropathy, hypothyroidism, hypomagnesemia, anemia and arthritis who presented to the ED after a fall at home and found to have a moderately angulated midshaft fracture of the left femur.     At baseline, patient ambulates with a walker however recently, she has been losing strength in her legs. Patient has chronic lower extremity swelling and neuropathy but has had recent increased redness to her legs, L>R.  She she has had poor appetite but denies any dizziness, recent illness, fevers, chills, nausea or vomiting. She was recently treated for possible UTI about a week ago.   **Interim History: She was taken for surgical intervention and postoperatively she was very somnolent and had a new oxygen requirement.  We transferred her to the PCU and she was weaned off but then had to be placed back on Supplemental O2 via [place] when she was sleeping.  Has a diabetic foot ulcer and Underwent aortogram of the Bilaterl LE and had a Left Leg Run-off on 05/30/24. She had a L common iliac artery stent placed.  PT OT recommended CIR but per CIR, she is not candidate for CIR, SNF was recommended but family does not want SNF, now referral has been sent to LTAC.  Assessment & Plan:   Principal Problem:   Femur fracture, left (HCC) Active Problems:   Fall at home, initial encounter   Cellulitis of both lower extremities  Mechanical fall and  Left femur fracture s/p Fall:  Orthopedic surgery consulted and she is s/p ORIF now. Ortho recommending weightbearing as tolerated and reinforce the dressings as needed.  They have initiated on enoxaparin  for VTE prophylaxis.  PT OT recommended CIR but per CIR, she is not a candidate  for them and they recommended SNF but family wants LTAC which has been denied.  SNF is the next option which family will have to pursue.  TOC working with family.  Per TOC, they are starting insurance authorization today 06/04/2024.  Lower Extremity Cellulitis: Patient with chronic lower extremity edema found to have mild cellulitis of the lower extremities but now has surgical intervention and Leg is wrapper; C/w IV Cefazolin  1 gram po q8h for now which we will continue as long as she is inpatient.   Acute Respiratory Failure w/ Hypoxia: Post Op and ? Related to Anesthesia however was hypoxic overnight few nights ago and had to be placed back on Supplemental O2.  Has been weaned to room air and she is comfortable.  Could have been due to atelectasis.   Type 2 Diabetes Mellitus complicated by Neuropathy:  Last A1c 6.5% 3 months ago.  PTA medications metformin , which is on hold.  Blood sugar elevated in 200s range.  Started on Lantus  yesterday when still hyperglycemic.  Will increase to 15 units.  Continue SSI.   Diabetic Foot Ulcer / Nonhealing pressure ulcer/chronic foot pain:  Patient with full-thickness ulcer at the posterior left heel with large eschar (see media tab);  No significant improvement in left heel ulcer over the last few weeks.  Followed by podiatry and wound care. Wound care consulted, appreciate recs; See below for LLE Common iliac Intervention. Will likely need Podiatry/Orth to evaluate for debridement once she heals from her Hip  Sx. The C is recommending IR team providing the patient a healing shoe from the [employer] and has been placed orders.  I increased her long-acting oxycodone  to 20 mg twice daily on the afternoon of 06/01/2024 and since then, her pain appears to be well-controlled and she has not required any as needed oxycodone  in the last 2 to 3 days.   PAD: ABIs 02/16/2024 are 0.85 on the right biphasic and 1.01 on the left biphasic.  Follows vascular surgery as outpatient.  Vascular  surgery consulted by Ortho in regards to possible intervention during this hospitalization; Underwent Aortogram/Arteriogram of Bilaterl LE and had stenting of the L Common iliac Artery and was given Plavix  load; Now on Clopidogrel  75 mg po daily w/ Breakfast -Continue Atorvastatin  as below   Hydronephrosis: Improving CT A/P 5 days ago showed bilateral hydronephrosis, R>L but no visible obstructing stone; Hydronephrosis thought to be due to patient's rectal cancer. Checked Renal U/S and showed  Mild right hydronephrosis which hasimproved since prior CT examination and Interval resolution of left Hydronephrosis. There was also Moderate right and mild left renal cortical atrophy. Will hold off Urological Consult given her improvement    Chronic Hypomagnesemia:  Unclear etiology, receives intermittent IV magnesium  by cancer center.  Magnesium  down to 1.3 again today.  Will replenish.   Essential HTN:  BP very well-controlled, remains on IV as needed hydralazine .   Cervical Cancer / Rectal cancer s/p colostomy: Followed closely with Oncology, patient has declined further chemotherapy or surgery for removal of the remaining tumor.  Continue Vitamin Supplementation   Normocytic Anemia / IDA:  Baseline 8-9; workup indicates iron deficiency anemia.  Continue iron supplementation w/ Ferrous Sulfate  325 mg po MWF.    Chronic BLE edema: Thought to be due to lymphedema;  Apply TED hose however her left leg is now wrapped from her surgery   HLD: C/w Atorvastatin  40 mg.   Hypothyroidism: C/w Levothyroxine  137 mcg po Daily    GERD/GI Prophylaxis: Continue home PPI w/ Pantoprazole  40 mg po Daily   Mood Disorder:  Continue Sertraline  150 mg po Daily   Constipation: Resolved.  Continue bowel regimen.   Perineal/groin and Buttock Wounds: WOC Nurse Consulted.  No more issues for last few days.   Class I Obesity: Complicates overall prognosis and care. Estimated body mass index is 32.12 kg/m as calculated from  the following:   Height as of this encounter: 5' (1.524 m).   Weight as of this encounter: 74.6 kg. Weight Loss and Dietary Counseling given   DVT prophylaxis: Place TED hose Start: 05/26/24 2205 SCDs Start: 05/26/24 1943   Code Status: Full Code  Family Communication: None present at bedside.  Plan of care discussed with patient in length and he/she verbalized understanding and agreed with it.  Discussed with her daughter over the speaker phone while I was still in her room.  Status is: Inpatient Remains inpatient appropriate because: Medically stable since 06/01/2024, pending placement to SNF   Estimated body mass index is 32.12 kg/m as calculated from the following:   Height as of this encounter: 5' (1.524 m).   Weight as of this encounter: 74.6 kg.    Nutritional Assessment: Body mass index is 32.12 kg/m.SABRA Seen by dietician.  I agree with the assessment and plan as outlined below: Nutrition Status:        . Skin Assessment: I have examined the patient's skin and I agree with the wound assessment as performed by the wound care RN as  outlined below:    Consultants:  Vascular surgery and orthopedics  Procedures:  As above  Antimicrobials:  Anti-infectives (From admission, onward)    Start     Dose/Rate Route Frequency Ordered Stop   05/27/24 1410  vancomycin  (VANCOCIN ) powder  Status:  Discontinued          As needed 05/27/24 1410 05/27/24 1528   05/26/24 2245  ceFAZolin  (ANCEF ) IVPB 1 g/50 mL premix        1 g 100 mL/hr over 30 Minutes Intravenous Every 8 hours 05/26/24 2150           Subjective: Patient seen and examined.  She has no complaints.  Her daughter was not at the bedside when I saw her.  Later on around 11 AM, received a message from the nurse that daughter showed up and she has decided to take her mother to claps.  Social worker was added in the same secure chat.  Patient has been stable and medically ready for last few days.  Waiting for social  worker to arrange discharge for her.  Objective: Vitals:   06/05/24 1920 06/05/24 2347 06/06/24 0326 06/06/24 0738  BP: (!) 157/47 (!) 155/48 (!) 133/38 (!) 149/41  Pulse: 83 69 62 67  Resp: 20 20 20 13   Temp: 98.5 F (36.9 C) 98.4 F (36.9 C) 98.1 F (36.7 C) 98.2 F (36.8 C)  TempSrc: Oral Oral Oral Oral  SpO2: 100% 98% 97% 98%  Weight:      Height:        Intake/Output Summary (Last 24 hours) at 06/06/2024 0753 Last data filed at 06/06/2024 0629 Gross per 24 hour  Intake 740.21 ml  Output 1600 ml  Net -859.79 ml   Filed Weights   05/27/24 1130 05/28/24 0353 05/30/24 0352  Weight: 70 kg 73.9 kg 74.6 kg    Examination:  General exam: Appears calm and comfortable  Respiratory system: Clear to auscultation. Respiratory effort normal. Cardiovascular system: S1 & S2 heard, RRR. No JVD, murmurs, rubs, gallops or clicks. No pedal edema. Gastrointestinal system: Abdomen is nondistended, soft and nontender. No organomegaly or masses felt. Normal bowel sounds heard. Central nervous system: Alert and oriented. No focal neurological deficits. Extremities: Symmetric 5 x 5 power. Skin: No rashes, lesions or ulcers.  Psychiatry: Judgement and insight appear normal. Mood & affect appropriate.    Data Reviewed: I have personally reviewed following labs and imaging studies  CBC: Recent Labs  Lab 05/31/24 0350 06/01/24 0916 06/02/24 0425 06/03/24 0500 06/05/24 0500  WBC 7.8 8.5 6.3 6.6 6.8  NEUTROABS 6.3 6.9 4.7 4.9 4.8  HGB 8.9* 9.7* 8.4* 8.9* 8.2*  HCT 30.3* 32.1* 28.0* 29.7* 27.5*  MCV 88.1 85.8 87.0 88.1 87.6  PLT 179 210 189 223 239   Basic Metabolic Panel: Recent Labs  Lab 05/31/24 0350 06/01/24 0915 06/01/24 0916 06/02/24 0425 06/03/24 0500 06/05/24 0500  NA 139  --  138 138  --  139  K 4.3  --  4.4 4.4  --  4.1  CL 103  --  102 102  --  103  CO2 26  --  26 28  --  27  GLUCOSE 136*  --  224* 181*  --  201*  BUN 25*  --  18 19  --  21  CREATININE 1.09*   --  0.82 0.80  --  0.74  CALCIUM  8.2*  --  8.6* 8.3*  --  8.2*  MG 1.6* 1.5*  --   --  1.7 1.3*  PHOS 3.6  --   --   --   --   --    GFR: Estimated Creatinine Clearance: 52.2 mL/min (by C-G formula based on SCr of 0.74 mg/dL). Liver Function Tests: Recent Labs  Lab 05/31/24 0350  AST 27  ALT 10  ALKPHOS 129*  BILITOT 0.6  PROT 4.7*  ALBUMIN 1.7*   No results for input(s): LIPASE, AMYLASE in the last 168 hours. No results for input(s): AMMONIA in the last 168 hours. Coagulation Profile: No results for input(s): INR, PROTIME in the last 168 hours.  Cardiac Enzymes: No results for input(s): CKTOTAL, CKMB, CKMBINDEX, TROPONINI in the last 168 hours. BNP (last 3 results) No results for input(s): PROBNP in the last 8760 hours. HbA1C: No results for input(s): HGBA1C in the last 72 hours. CBG: Recent Labs  Lab 06/05/24 0628 06/05/24 1140 06/05/24 1645 06/05/24 2112 06/06/24 0626  GLUCAP 206* 238* 227* 240* 189*   Lipid Profile: No results for input(s): CHOL, HDL, LDLCALC, TRIG, CHOLHDL, LDLDIRECT in the last 72 hours.  Thyroid  Function Tests: No results for input(s): TSH, T4TOTAL, FREET4, T3FREE, THYROIDAB in the last 72 hours. Anemia Panel: No results for input(s): VITAMINB12, FOLATE, FERRITIN, TIBC, IRON, RETICCTPCT in the last 72 hours. Sepsis Labs: No results for input(s): PROCALCITON, LATICACIDVEN in the last 168 hours.  Recent Results (from the past 240 hours)  Surgical pcr screen     Status: None   Collection Time: 05/27/24 11:08 AM   Specimen: Nasal Mucosa; Nasal Swab  Result Value Ref Range Status   MRSA, PCR NEGATIVE NEGATIVE Final   Staphylococcus aureus NEGATIVE NEGATIVE Final    Comment: (NOTE) The Xpert SA Assay (FDA approved for NASAL specimens in patients 83 years of age and older), is one component of a comprehensive surveillance program. It is not intended to diagnose infection nor  to guide or monitor treatment. Performed at [facility], [address], [place], [place] [address]      Radiology Studies: No results found.  Scheduled Meds:  atorvastatin   40 mg Oral QHS   baclofen   10 mg Oral BID   Chlorhexidine  Gluconate Cloth  6 each Topical Daily   cholecalciferol   2,000 Units Oral Daily   clopidogrel   75 mg Oral Q breakfast   clotrimazole    Topical Daily   cyanocobalamin   1,000 mcg Oral Daily   feeding supplement  237 mL Oral TID BM   ferrous sulfate   325 mg Oral Q M,W,F   gabapentin   300 mg Oral QHS   gabapentin   600 mg Oral Daily   guaiFENesin   1,200 mg Oral BID   insulin  aspart  0-20 Units Subcutaneous TID WC   insulin  aspart  0-5 Units Subcutaneous QHS   insulin  glargine-yfgn  15 Units Subcutaneous Daily   leptospermum manuka honey  1 Application Topical Daily   levothyroxine   137 mcg Oral Q0600   magnesium  oxide  400 mg Oral BID   multivitamin with minerals  1 tablet Oral Q breakfast   oxyCODONE   20 mg Oral Q12H   pantoprazole   40 mg Oral QAC breakfast   polyethylene glycol  17 g Oral BID   sertraline   150 mg Oral QHS   sodium chloride  flush  3 mL Intravenous Q12H   Continuous Infusions:   ceFAZolin  (ANCEF ) IV 1 g (06/06/24 0634)     LOS: 11 days   Fredia Skeeter, MD [facility]  06/06/2024, 7:53 AM   *Please note that this  is a verbal dictation therefore any spelling or grammatical errors are due to the Dragon Medical One system interpretation.  Please page via Amion and do not message via secure chat for urgent patient care matters. Secure chat can be used for non urgent patient care matters.  How to contact the [facility] Attending or Consulting provider 7A - 7P or covering provider during after hours 7P -7A, for this patient?  Check the care team in [facility] and look for a) attending/consulting [facility] provider listed and b) the [facility] team listed. Page or secure chat 7A-7P. Log into [email or web address] and use [place]'s universal password  to access. If you do not have the password, please contact the hospital operator. Locate the [facility] provider you are looking for under [facility] and page to a number that you can be directly reached. If you still have difficulty reaching the provider, please page the [facility] (Director on Call) for the Hospitalists listed on amion for assistance.

## 2024-06-06 NOTE — TOC Progression Note (Signed)
 Transition of Care [facility]) - Progression Note    Patient Details  Name: [name] MRN: [id number] Date of Birth: 08-03-1946  Transition of Care [facility]) CM/SW Contact  Montie LOISE Louder, [place] Phone Number: 06/06/2024, 11:17 AM  Clinical Narrative:     Received message form RN- patient's daughter wants her to go to [facility]- CSW will follow up with SNF to confirmed offer- if confirmed, insurance auth will be started after seen by PT.  Montie Louder, MSW, LCSW Clinical Social Worker     Expected Discharge Plan: Long Term Acute Care (LTAC) Barriers to Discharge: Insurance Authorization  Expected Discharge Plan and Services   Discharge Planning Services: CM Consult Post Acute Care Choice: Long Term Acute Care (LTAC) Living arrangements for the past 2 months: Single Family Home                             HH Agency: [facility]         Social Determinants of Health (SDOH) Interventions SDOH Screenings   Food Insecurity: No Food Insecurity (05/27/2024)  Housing: Low Risk  (05/27/2024)  Transportation Needs: No Transportation Needs (05/27/2024)  Utilities: Not At Risk (05/27/2024)  Depression (PHQ2-9): Low Risk  (01/17/2020)  Social Connections: Moderately Integrated (05/27/2024)  Tobacco Use: Medium Risk (05/27/2024)    Readmission Risk Interventions    10/15/2023    9:49 AM 09/11/2023   11:36 AM 12/02/2021    1:55 PM  Readmission Risk Prevention Plan  Transportation Screening Complete Complete Complete  PCP or Specialist Appt within 3-5 Days  Complete   HRI or Home Care Consult  Complete   Social Work Consult for Recovery Care Planning/Counseling  Complete   Palliative Care Screening  Not Applicable   Medication Review Oceanographer) Referral to Pharmacy Complete Complete  PCP or Specialist appointment within 3-5 days of discharge Complete  Complete  HRI or Home Care Consult Complete  Complete  SW Recovery Care/Counseling Consult Complete   Complete  Palliative Care Screening Not Applicable  Not Applicable  Skilled Nursing Facility Not Applicable  Patient Refused

## 2024-06-06 NOTE — Plan of Care (Signed)
  Problem: Education: Goal: Knowledge of General Education information will improve Description: Including pain rating scale, medication(s)/side effects and non-pharmacologic comfort measures Outcome: Progressing   Problem: Clinical Measurements: Goal: Ability to maintain clinical measurements within normal limits will improve Outcome: Progressing Goal: Will remain free from infection Outcome: Progressing Goal: Diagnostic test results will improve Outcome: Progressing Goal: Respiratory complications will improve Outcome: Progressing Goal: Cardiovascular complication will be avoided Outcome: Progressing   Problem: Nutrition: Goal: Adequate nutrition will be maintained Outcome: Progressing   Problem: Coping: Goal: Level of anxiety will decrease Outcome: Progressing   Problem: Elimination: Goal: Will not experience complications related to bowel motility Outcome: Progressing Goal: Will not experience complications related to urinary retention Outcome: Progressing   Problem: Pain Managment: Goal: General experience of comfort will improve and/or be controlled Outcome: Progressing   Problem: Safety: Goal: Ability to remain free from injury will improve Outcome: Progressing   Problem: Skin Integrity: Goal: Risk for impaired skin integrity will decrease Outcome: Progressing   Problem: Fluid Volume: Goal: Ability to maintain a balanced intake and output will improve Outcome: Progressing   Problem: Metabolic: Goal: Ability to maintain appropriate glucose levels will improve Outcome: Progressing   Problem: Nutritional: Goal: Maintenance of adequate nutrition will improve Outcome: Progressing   Problem: Skin Integrity: Goal: Risk for impaired skin integrity will decrease Outcome: Progressing   Problem: Tissue Perfusion: Goal: Adequacy of tissue perfusion will improve Outcome: Progressing

## 2024-06-06 NOTE — Progress Notes (Signed)
 Physical Therapy Treatment Patient Details Name: [name] MRN: [id number] DOB: 1945/12/11 Today's Date: 06/06/2024   History of Present Illness 78 y.o female admitted 05/26/24 s/p fall in home with Lt periprosthetic femur fx. 7/11 S/p ORIF.  7/14 s/p stent of left common iliac artery for heel ulceration, still at high risk for LLE limb loss due to L heel wound per vascular MD note. PTA messaged vascular MD and ortho MD to follow-up on L post-op shoe to offload L heel 7/16 and 7/21. PMH: HLD, HTN, cervical and rectal CA s/p colostomy, PAD, neuropathy, hypothyroidism, arthritis, DM, CVA.    PT Comments  Pt received in recliner, agreeable to therapy session with pt reporting she is eager to return to bed from chair and was sitting up >3 hours. Pt needing up to +3 maxA for sit<>stand from lower chair surface with one NT blocking her feet to prevent sliding and +2 lift assist via pad under her hips and gait belt support. Pt with elevated BP and high pain score in supine post-exertion, RN notified. Pt performed x2 sit<>stand from chair<>stedy, x1 sit<>stand from elevated Stedy flaps and x1 sit<>stand from EOB<>RW, needing +2-3 person lift assist on each occasion. Patient will benefit from continued inpatient follow up therapy, <3 hours/day and pt reports severe fatigue >8/10 modified RPE at end of session, PTA reviewed pt progress with supervising PT Cathy H.     If plan is discharge home, recommend the following: Two people to help with walking and/or transfers;Assistance with cooking/housework;Assist for transportation;Help with stairs or ramp for entrance;Supervision due to cognitive status   Can travel by private vehicle     No  Equipment Recommendations  Wheelchair (measurements PT);Wheelchair cushion (measurements PT);Hoyer lift;Hospital bed (air bed overlay for hospital bed)    Recommendations for Other Services       Precautions / Restrictions Precautions Precautions: Fall Recall of  Precautions/Restrictions: Impaired Precaution/Restrictions Comments: LLE heel wound, Contact precs - ESBL Required Braces or Orthoses:  (PTA asked if pt can get order for L post-op shoe to offload her L heel wound) Restrictions Weight Bearing Restrictions Per Provider Order: Yes LLE Weight Bearing Per Provider Order: Weight bearing as tolerated Other Position/Activity Restrictions: PTA messaged Vascular surgeon Sheree 7/16 to ask if pt can get post-op shoe ordered given heel wound, OK per MD if ortho agrees, PTA followed up with ortho PA 7/21     Mobility  Bed Mobility Overal bed mobility: Needs Assistance Bed Mobility: Sit to Supine       Sit to supine: Max assist   General bed mobility comments: +2 safety due to pt pain/fatigue, trunk and BLE assist needed; totalA +2 for supine scooting toward HOB after return to supine.    Transfers Overall transfer level: Needs assistance Equipment used: Rolling walker (2 wheels), Ambulation equipment used Transfers: Sit to/from Stand, Bed to chair/wheelchair/BSC Sit to Stand: Max assist, +2 physical assistance, From elevated surface, Via lift equipment Stand pivot transfers: Max assist, +2 physical assistance, +2 safety/equipment         General transfer comment: Pt was able to stand from chair>Stedy x2 trials, poor technique initially with RLE and LLE sliding forward and needing second trial with +2 lift assist via bed pad and gait belt and third person to prevent feet sliding forward to achieve full upright for placement of Stedy flaps behind her hips. Once transferred to EOB, pt able to stand from [place] and then again from EOB to RW with +2 maxA and  bil feet blocked for safety. Pt reports pain is 9/10 with standing trials and of note, pt BP elevated post-exertion in supine, checked in both arms. Pt unable to weight shift in stance to take sidesteps toward [facility]. Transfer via Lift Equipment: Stedy  Ambulation/Gait               General  Gait Details: Unable at this time.   Stairs             Wheelchair Mobility     Tilt Bed    Modified Rankin (Stroke Patients Only)       Balance Overall balance assessment: Needs assistance Sitting-balance support: Bilateral upper extremity supported, Feet supported Sitting balance-Leahy Scale: Fair Sitting balance - Comments: continues with bil UE support and slight R lean; Poor unsupported   Standing balance support: Bilateral upper extremity supported, During functional activity Standing balance-Leahy Scale: Zero Standing balance comment: +2 maxA at [employer] and [employer] Communication: No apparent difficulties  Cognition Arousal: Alert Behavior During Therapy: Anxious, Impulsive   PT - Cognitive impairments: Attention, Sequencing, Safety/Judgement, Memory, Problem solving                       PT - Cognition Comments: Pt continues to be very pain focused. Moderate cues for pursed-lip breathing throughout, safer body mechanics and activity pacing. Following commands: Impaired Following commands impaired: Follows one step commands inconsistently, Follows one step commands with increased time    Cueing Cueing Techniques: Verbal cues, Gestural cues, Tactile cues  Exercises      General Comments General comments (skin integrity, edema, etc.): BP elevated 178048 (81) HR 60-62 bpm post-exertion, SpO2 100% on RA when checked on earlobe (fingertips and toes feel cool). Of note, NT checked blood sugar at end of session and reading elevated >350, pt c/o feeling hot. Pt reports severe fatigue when PTA asking about RPE (fatigue) after pt returned to supine.      Pertinent Vitals/Pain Pain Assessment Pain Assessment: 0-10 Pain Score: 9  Pain Location: Incision site on LLE knee/thigh and L heel where wound is located Pain Descriptors / Indicators: Aching, Grimacing, Guarding, Moaning, Sore, Sharp Pain  Intervention(s): Limited activity within patient's tolerance, Monitored during session, Premedicated before session, Repositioned, Patient requesting pain meds-RN notified, Ice applied    Home Living                          Prior Function            PT Goals (current goals can now be found in the care plan section) Acute Rehab PT Goals Patient Stated Goal: improve mobility and pain before returning home. PT Goal Formulation: With patient Time For Goal Achievement: 06/12/24 Progress towards PT goals: Progressing toward goals    Frequency    Min 2X/week      PT Plan      Co-evaluation              AM-PAC PT 6 Clicks Mobility   Outcome Measure  Help needed turning from your back to your side while in a flat bed without using bedrails?: A Lot Help needed moving from lying on your back to sitting on the side of a flat bed without using bedrails?: Total Help needed moving to and from  a bed to a chair (including a wheelchair)?: Total Help needed standing up from a chair using your arms (e.g., wheelchair or bedside chair)?: Total (+2 maxA today) Help needed to walk in hospital room?: Total Help needed climbing 3-5 steps with a railing? : Total 6 Click Score: 7    End of Session Equipment Utilized During Treatment: Gait belt Activity Tolerance: Patient limited by pain;Patient limited by fatigue Patient left: in bed;with call bell/phone within reach;with bed alarm set;with family/visitor present;Other (comment) (son present during session) Nurse Communication: Mobility status;Need for lift equipment;Patient requests pain meds;Other (comment) (elevated BP, pain score) PT Visit Diagnosis: Unsteadiness on feet (R26.81);Other abnormalities of gait and mobility (R26.89);Muscle weakness (generalized) (M62.81);Pain Pain - Right/Left: Left Pain - part of body: Hip;Ankle and joints of foot     Time: 1551-1626 PT Time Calculation (min) (ACUTE ONLY): 35  min  Charges:    $Therapeutic Activity: 23-37 mins PT General Charges $$ ACUTE PT VISIT: 1 Visit                     Jabes Primo P., PTA Acute Rehabilitation Services Secure Chat Preferred 9a-5:30pm Office: [phone]    Connell HERO [facility] 06/06/2024, 4:51 PM

## 2024-06-07 DIAGNOSIS — S7292XA Unspecified fracture of left femur, initial encounter for closed fracture: Secondary | ICD-10-CM | POA: Diagnosis not present

## 2024-06-07 LAB — URINALYSIS, ROUTINE W REFLEX MICROSCOPIC
Bilirubin Urine: NEGATIVE
Glucose, UA: 50 mg/dL — AB
Ketones, ur: NEGATIVE mg/dL
Nitrite: NEGATIVE
Protein, ur: 100 mg/dL — AB
Specific Gravity, Urine: 1.02 (ref 1.005–1.030)
pH: 5 (ref 5.0–8.0)

## 2024-06-07 LAB — GLUCOSE, CAPILLARY
Glucose-Capillary: 212 mg/dL — ABNORMAL HIGH (ref 70–99)
Glucose-Capillary: 249 mg/dL — ABNORMAL HIGH (ref 70–99)
Glucose-Capillary: 368 mg/dL — ABNORMAL HIGH (ref 70–99)
Glucose-Capillary: 93 mg/dL (ref 70–99)

## 2024-06-07 LAB — MAGNESIUM: Magnesium: 1.5 mg/dL — ABNORMAL LOW (ref 1.7–2.4)

## 2024-06-07 MED ORDER — HEPARIN SOD (PORK) LOCK FLUSH 100 UNIT/ML IV SOLN: 500.0000 [IU] | INTRAVENOUS | Status: DC | PRN

## 2024-06-07 MED ORDER — MAGNESIUM SULFATE 4 GM/100ML IV SOLN
4.0000 g | Freq: Once | INTRAVENOUS | Status: AC
  Administered 2024-06-07: 4 g via INTRAVENOUS
  Filled 2024-06-07: qty 100

## 2024-06-07 NOTE — Discharge Summary (Signed)
 Physician Discharge Summary  [name] FMW:[id number] DOB: [birth year] DOA: 05/26/2024  PCP: Waylan Almarie SAUNDERS, MD  Admit date: 05/26/2024 Discharge date: 06/08/2024 30 Day Unplanned Readmission Risk Score    Flowsheet Row ED to Hosp-Admission (Current) from 05/26/2024 in [facility] 4E CV SURGICAL PROGRESSIVE CARE  30 Day Unplanned Readmission Risk Score (%) 43.45 Filed at 06/07/2024 0801    This score is the patient's risk of an unplanned readmission within 30 days of being discharged (0 -100%). The score is based on dignosis, age, lab data, medications, orders, and past utilization.   Low:  0-14.9   Medium: 15-21.9   High: 22-29.9   Extreme: 30 and above          Admitted From: Home Disposition: SNF  Recommendations for Outpatient Follow-up:  Follow up with PCP in 1-2 weeks Please obtain BMP/CBC in one week Follow-up with orthopedics within 2 weeks. Please follow up with your PCP on the following pending results: Unresulted Labs (From admission, onward)    None         Home Health: None Equipment/Devices: None  Discharge Condition: Stable CODE STATUS: Full code Diet recommendation: Diabetic  Subjective: Seen and examined.  No complaints.  Pain controlled.  Brief/Interim Summary: [name] is a 79 y.o. female with medical history significant for T2DM, HLD, HTN, cervical and rectal cancer s/p colostomy, PAD, diabetic foot and heel ulcer,, neuropathy, hypothyroidism, hypomagnesemia, anemia and arthritis who presented to the ED after a fall at home and found to have a moderately angulated midshaft fracture of the left femur.     At baseline, patient ambulates with a walker however recently, she has been losing strength in her legs. Patient has chronic lower extremity swelling and neuropathy but has had recent increased redness to her legs, L>R.  She she has had poor appetite but denies any dizziness, recent illness, fevers, chills, nausea or vomiting. She was recently  treated for possible UTI about a week ago.   **Interim History: She was taken for surgical intervention and postoperatively she was very somnolent and had a new oxygen requirement.  We transferred her to the PCU and she was weaned off but then had to be placed back on Supplemental O2 via [place] when she was sleeping.  Has a diabetic foot ulcer and Underwent aortogram of the Bilaterl LE and had a Left Leg Run-off on 05/30/24. She had a L common iliac artery stent placed.  PT OT recommended CIR but per CIR, she is not candidate for CIR, SNF was recommended but family does not want SNF, now referral has been sent to LTAC.   Mechanical fall and  Left femur fracture s/p Fall:  Orthopedic surgery consulted and she is s/p ORIF now. Ortho recommending weightbearing as tolerated and reinforce the dressings as needed.  They have initiated on enoxaparin  for VTE prophylaxis.  PT OT recommended CIR but per CIR, she is not a candidate for them and they recommended SNF but family wants LTAC which has been denied.  Patient eventually discharged to SNF.   Lower Extremity Cellulitis: Patient with chronic lower extremity edema found to have mild cellulitis of the lower extremities but now has surgical intervention and Leg is wrapper; received cefazolin  for 12 days.   Acute Respiratory Failure w/ Hypoxia: Post Op and ? Related to Anesthesia however was hypoxic overnight few nights ago and had to be placed back on Supplemental O2.  Has been weaned to room air and she is comfortable.  Could  have been due to atelectasis.   Type 2 Diabetes Mellitus complicated by Neuropathy:  Last A1c 6.5% 3 months ago.  PTA medications metformin , which is on hold.  Blood sugar elevated in 200s range.  NH with Lantus  and SSI here.  Resume PTA medications.   Diabetic Foot Ulcer / Nonhealing pressure ulcer/chronic foot pain:  Patient with full-thickness ulcer at the posterior left heel with large eschar (see media tab);  No significant improvement in  left heel ulcer over the last few weeks.  Followed by podiatry and wound care. Wound care consulted, appreciate recs; See below for LLE Common iliac Intervention. Will likely need Podiatry/Orth to evaluate for debridement once she heals from her Hip Sx. The C is recommending IR team providing the patient a healing shoe from the [employer] and has been placed orders.  Pain was very well-controlled on long-acting oxycodone .  Discharging on that.   PAD: ABIs 02/16/2024 are 0.85 on the right biphasic and 1.01 on the left biphasic.  Follows vascular surgery as outpatient.  Vascular surgery consulted by Ortho in regards to possible intervention during this hospitalization; Underwent Aortogram/Arteriogram of Bilaterl LE and had stenting of the L Common iliac Artery and was given Plavix  load; Now on Clopidogrel  75 mg po daily w/ Breakfast -Continue Atorvastatin  as below   Hydronephrosis: Improving CT A/P 5 days ago showed bilateral hydronephrosis, R>L but no visible obstructing stone; Hydronephrosis thought to be due to patient's rectal cancer. Checked Renal U/S and showed  Mild right hydronephrosis which hasimproved since prior CT examination and Interval resolution of left Hydronephrosis. There was also Moderate right and mild left renal cortical atrophy. Will hold off Urological Consult given her improvement follow-up with them as outpatient.   Chronic Hypomagnesemia:  Unclear etiology, receives intermittent IV magnesium  by cancer center.  Magnesium  down to 1.3 again today.  Will replenish.   Essential HTN:  BP very well-controlled,    Cervical Cancer / Rectal cancer s/p colostomy: Followed closely with Oncology, patient has declined further chemotherapy or surgery for removal of the remaining tumor.  Continue Vitamin Supplementation   Normocytic Anemia / IDA:  Baseline 8-9; workup indicates iron deficiency anemia.  Continue iron supplementation w/ Ferrous Sulfate  325 mg po MWF.    Chronic BLE edema: Thought to  be due to lymphedema;  Apply TED hose however her left leg is now wrapped from her surgery   HLD: C/w Atorvastatin  40 mg.   Hypothyroidism: C/w Levothyroxine  137 mcg po Daily    GERD/GI Prophylaxis: Continue home PPI w/ Pantoprazole  40 mg po Daily   Mood Disorder:  Continue Sertraline  150 mg po Daily   Constipation: Resolved.  Continue bowel regimen.   Perineal/groin and Buttock Wounds: WOC Nurse Consulted.  No more issues for last few days.   Class I Obesity: Complicates overall prognosis and care. Estimated body mass index is 32.12 kg/m as calculated from the following:   Height as of this encounter: 5' (1.524 m).   Weight as of this encounter: 74.6 kg. Weight Loss and Dietary Counseling given  Discharge plan was discussed with patient and/or family member and they verbalized understanding and agreed with it.  Discharge Diagnoses:  Principal Problem:   Femur fracture, left [name]) Active Problems:   Fall at home, initial encounter   Cellulitis of both lower extremities    Discharge Instructions   Allergies as of 06/08/2024       Reactions   Codeine Hives, Nausea Only  Medication List     STOP taking these medications    cefpodoxime  200 MG tablet Commonly known as: VANTIN    magnesium  oxide 400 (240 Mg) MG tablet Commonly known as: MAGnesium -Oxide   magnesium  oxide 400 MG tablet Commonly known as: MAG-OX   Xtampza  ER 9 MG C12a Generic drug: oxyCODONE  ER Replaced by: oxyCODONE  20 mg 12 hr tablet       TAKE these medications    acetaminophen  500 MG tablet Commonly known as: TYLENOL  Take 500 mg by mouth daily as needed for moderate pain (pain score 4-6) or headache.   atorvastatin  40 MG tablet Commonly known as: LIPITOR Take 40 mg by mouth at bedtime.   baclofen  10 MG tablet Commonly known as: LIORESAL  Take 1 tablet (10 mg total) by mouth 2 (two) times daily.   busPIRone 5 MG tablet Commonly known as: BUSPAR Take 5 mg by mouth 2 (two) times  daily as needed (for anxiety).   CalProtect 0.44-20.6 % Oint Generic drug: Menthol-Zinc  Oxide Apply 1 application  topically daily as needed (for irritation- affected sites).   clopidogrel  75 MG tablet Commonly known as: PLAVIX  Take 1 tablet (75 mg total) by mouth daily with breakfast.   clotrimazole -betamethasone  cream Commonly known as: LOTRISONE  APPLY CREAM TOPICALLY ONCE DAILY TO HEELS *NEW PRESCRIPTION REQUEST* What changed: See the new instructions.   cyanocobalamin  1000 MCG tablet Commonly known as: VITAMIN B12 Take 1,000 mcg by mouth daily.   enoxaparin  40 MG/0.4ML injection Commonly known as: LOVENOX  Inject 0.4 mLs (40 mg total) into the skin daily.   Euthyrox  137 MCG tablet Generic drug: levothyroxine  Take 137 mcg by mouth daily before breakfast.   ferrous sulfate  325 (65 FE) MG tablet Take 325 mg by mouth See admin instructions. Take 325 mg by mouth with food on Mon/Wed/Fri   FreeStyle Libre 3 Reader [place] as directed.   FreeStyle Libre 3 Sensor Misc Inject 1 Device into the skin every 14 (fourteen) days.   gabapentin  300 MG capsule Commonly known as: NEURONTIN  Take 300-600 mg by mouth See admin instructions. Take 600 mg by mouth in the morning and 300 mg at bedtime   Leg Cramps Tabs Take 1 tablet by mouth 2 (two) times daily as needed (leg pain).   lidocaine -prilocaine  cream Commonly known as: EMLA  APPLY 1/2 (ONE-HALF) TABLESPOON TO PORT SITE 1 TO 2 HOURS PRIOR TO STICK AND COVER WITH PRESS-AND-SEAL TO NUMB SITE. *NEW PRESCRIPTION REQUEST* What changed: See the new instructions.   metFORMIN  750 MG 24 hr tablet Commonly known as: GLUCOPHAGE -XR Take 750 mg by mouth 2 (two) times daily with a meal.   multivitamin tablet Take 1 tablet by mouth daily with breakfast.   Nyamyc  powder Generic drug: nystatin  APPLY POWDER TOPICALLY TWICE DAILY TO GROIN RASH What changed: See the new instructions.   oxyCODONE  5 MG immediate release tablet Commonly known  as: Roxicodone  Take 0.5-1 tablets (2.5-5 mg total) by mouth every 4 (four) hours as needed for severe pain (pain score 7-10). What changed: how much to take   oxyCODONE  20 mg 12 hr tablet Commonly known as: OXYCONTIN  Take 1 tablet (20 mg total) by mouth every 12 (twelve) hours. What changed: You were already taking a medication with the same name, and this prescription was added. Make sure you understand how and when to take each. Replaces: Xtampza  ER 9 MG C12a   pantoprazole  40 MG tablet Commonly known as: PROTONIX  Take 1 tablet (40 mg total) by mouth daily. What changed: when to take  this   sertraline  100 MG tablet Commonly known as: ZOLOFT  Take 150 mg by mouth at bedtime.   Vitamin D3 Super Strength 50 MCG (2000 UT) Caps Generic drug: Cholecalciferol  Take 2,000 Units by mouth daily.        Follow-up Information     Haddix, Franky SQUIBB, MD. Schedule an appointment as soon as possible for a visit in 2 week(s).   Specialty: Orthopedic Surgery Why: for wound check and repeat x-rays Contact information: [address] [place] [address] [phone]         Waylan Almarie SAUNDERS, MD Follow up in 1 week(s).   Specialty: Family Medicine Contact information: [address] [place] [address] [phone]                Allergies  Allergen Reactions   Codeine Hives and Nausea Only    Consultations: Orthopedics, vascular surgery   Procedures/Studies: PERIPHERAL VASCULAR CATHETERIZATION Result Date: 05/30/2024 Patient name: [name] MRN: [id number] DOB: Apr 05, 1946 Sex: female 05/30/2024 Pre-operative Diagnosis: Atherosclerosis native arteries left lower extremity with heel ulceration Post-operative diagnosis:  Same Surgeon:  Penne BROCKS. Sheree, MD Procedure Performed: 1.  Percutaneous ultrasound-guided cannulation right common femoral artery 2.  Catheter aorta and aortogram with bilateral lower extremity angiography 3.  Catheter selection left common  femoral artery 4.  Stent of left common iliac artery with 7 x 39 mm VBX 5.  Moderate sedation with fentanyl  and Versed  for 14 minutes Indications: 78 year old female with history of left heel ulceration now admitted with left femur fracture.  She is indicated for angiography with possible invention. Findings: The aorta and iliac segments were heavily calcified.  On the right side there was calcification and the hypogastric artery was occluded there was no evidence of stenosis.  The right common femoral artery was patent with patency of the SFA with 2 areas of 30% stenosis and she had three-vessel runoff to the ankle the peroneal artery was diminutive towards the mid calf.  On the left side as a site of interest there was a heavily calcified stenosed left common iliac artery with a 30 mmHg gradient.  This was stented to 0% residual stenosis.  The hypogastric artery on the left is occluded but there is a large patent external iliac artery and common femoral artery with patency of the SFA all the way through with three-vessel runoff to the ankle.  At completion there was a small dissection at the distal aorta near the takeoff of the left common femoral artery which was likely an injury secondary to sheath placement which was quite difficult but this was nonflow limiting and there did not appear to be any active extravasation and I elected for no intervention.  Procedure:  The patient was identified in the holding area and taken to room 8.  The patient was then placed supine on the table and prepped and draped in the usual sterile fashion.  A time out was called.  Ultrasound was used to evaluate the right common femoral artery which was quite calcified.  The area was anesthetized 1% lidocaine  and cannulated with a micropuncture needle followed by wire sheath.  Concomitantly we administered fentanyl  and Versed  as moderate sedation her vital signs were monitored throughout the case.  I placed a Bentson wire which was  difficult to traverse the calcified iliacs but this was reached into the aorta and then placed a 5 French sheath.  An Omni catheter was placed to the level of L1 and aortogram was  performed followed by pelvic angiography with an angled and then pullback gradient was done across the common iliac artery which was 30 mmHg we then placed a wire to the common femoral artery followed by the Omni catheter and perform left lower extremity angiography.  We then placed a Rosen wire and exchanged for a long 6 French sheath which was very difficult to go up and over the bifurcation and this is likely where the dissection of the aorta occurred.  Patient was administered 5000 units of heparin .  We primarily stented the common iliac artery with 7 x 39 mm VBX to 0% residual stenosis.  We then exchanged for a short 6 French sheath on the right perform right lower extremity angiography with the above findings.  She tolerated the procedure without any complication. Contrast: 90cc Brandon C. Sheree, MD [facility] Office: [phone] Pager: [phone]   DG CHEST PORT 1 VIEW Result Date: 05/30/2024 CLINICAL DATA:  Shortness of breath. History of rectal and cervical cancer. EXAM: PORTABLE CHEST 1 VIEW COMPARISON:  05/29/2024 FINDINGS: Prior ductal right Port-A-Cath tip: Cavoatrial junction. Atherosclerotic calcification of the aortic arch. Thoracic spondylosis and bilateral degenerative glenohumeral arthropathy. Subsegmental atelectasis or scarring peripherally at the right lung base. Heart size within normal limits. The patient is rotated to the right on today's radiograph, reducing diagnostic sensitivity and specificity. No acute findings are identified. IMPRESSION: 1. Subsegmental atelectasis or scarring peripherally at the right lung base. 2. Thoracic spondylosis and bilateral degenerative glenohumeral arthropathy. 3. Aortic Atherosclerosis (ICD10-I70.0). Electronically Signed   By: Ryan Salvage M.D.   On: 05/30/2024 09:53   DG CHEST PORT 1 VIEW Result Date: 05/29/2024 CLINICAL DATA:  Shortness of breath EXAM: PORTABLE CHEST 1 VIEW COMPARISON:  Chest x-ray 04/27/2024 FINDINGS: Right chest port catheter tip ends in the SVC. The cardiomediastinal silhouette is within normal limits. Left-sided central venous catheter has been removed. There is no focal lung infiltrate, pleural effusion or pneumothorax. No acute fractures. IMPRESSION: No active disease. Electronically Signed   By: Greig Pique M.D.   On: 05/29/2024 17:40   US  RENAL Result Date: 05/27/2024 CLINICAL DATA:  Hydronephrosis EXAM: RENAL / URINARY TRACT ULTRASOUND COMPLETE COMPARISON:  CT 05/21/2024 FINDINGS: Right Kidney: Renal measurements: 7.9 x 3.7 x 4.9 cm = volume: 75 mL. Moderate renal cortical atrophy. Renal cortical echogenicity is within normal limits. Mild hydronephrosis, improved since prior CT examination. No intrarenal masses or calcifications are seen. No perinephric fluid collections. Left Kidney: Renal measurements: 8.2 x 5.0 x 5.0 cm = volume: 107 mL. Limited visualization of the lower pole. Mild renal cortical atrophy. Renal cortical echogenicity is within normal limits. Previously noted hydronephrosis has resolved. No intrarenal masses or calcifications are seen. No perinephric fluid collections Bladder: Decompressed and not well visualized Other: None. IMPRESSION: 1. Mild right hydronephrosis, improved since prior CT examination. 2. Interval resolution of left hydronephrosis. 3. Moderate right and mild left renal cortical atrophy. Electronically Signed   By: Dorethia Molt M.D.   On: 05/27/2024 23:25   DG FEMUR PORT MIN 2 VIEWS LEFT Result Date: 05/27/2024 CLINICAL DATA:  Postoperative status. EXAM: LEFT FEMUR PORTABLE 2 VIEWS COMPARISON:  Fluoroscopic images of same day. FINDINGS: Status post left total hip arthroplasty. Status post surgical internal fixation of midshaft fracture of left femur. Good alignment  of fracture components is noted. IMPRESSION: Status post surgical internal fixation of left femoral midshaft fracture. Electronically Signed   By: Lynwood Landy Raddle M.D.   On: 05/27/2024  18:14   DG CHEST PORT 1 VIEW Result Date: 05/27/2024 CLINICAL DATA:  Central line placement. EXAM: PORTABLE CHEST 1 VIEW COMPARISON:  May 26, 2024. FINDINGS: Stable cardiomediastinal silhouette. Right internal jugular Port-A-Cath is unchanged. Interval placement of left internal jugular catheter with distal tip in expected position of left brachiocephalic vein. No acute pulmonary disease is noted. Bony thorax is unremarkable. No pneumothorax is seen. IMPRESSION: Interval placement of left internal jugular catheter with distal tip in expected position of left brachiocephalic vein. Electronically Signed   By: Lynwood Landy Raddle M.D.   On: 05/27/2024 18:13   DG FEMUR MIN 2 VIEWS LEFT Result Date: 05/27/2024 CLINICAL DATA:  Elective surgery. EXAM: LEFT FEMUR 2 VIEWS COMPARISON:  Preoperative imaging FINDINGS: Six fluoroscopic spot views of the femur are submitted from the operating room. Plate and screw fixation of femoral shaft fracture with additional cerclage wires. Previous proximal femoral hardware is in place. Fluoroscopy time 38.7 seconds. Dose 3.76 mGy. IMPRESSION: Intraoperative fluoroscopy during femoral fracture fixation. Electronically Signed   By: Andrea Gasman M.D.   On: 05/27/2024 15:29   DG C-Arm 1-60 Min-No Report Result Date: 05/27/2024 Fluoroscopy was utilized by the requesting physician.  No radiographic interpretation.   CT FEMUR LEFT WO CONTRAST Result Date: 05/26/2024 CLINICAL DATA:  Femur fracture EXAM: CT OF THE LOWER LEFT EXTREMITY WITHOUT CONTRAST TECHNIQUE: Multidetector CT imaging of the lower left extremity was performed according to the standard protocol. RADIATION DOSE REDUCTION: This exam was performed according to the departmental dose-optimization program which includes automated exposure  control, adjustment of the mA and/or kV according to patient size and/or use of iterative reconstruction technique. COMPARISON:  Plain films today FINDINGS: Bones/Joint/Cartilage Prior left hip replacement. There is a fracture through the midshaft of the left femur just below the hip prosthesis stem. Angulation and displacement. Ligaments Suboptimally assessed by CT. Muscles and Tendons Negative Soft tissues Edema throughout the subcutaneous soft tissues. IMPRESSION: Angulated and displaced midshaft left femoral fracture just below the left femoral prosthesis. Prior left hip replacement. Electronically Signed   By: Franky Crease M.D.   On: 05/26/2024 23:37   DG Tibia/Fibula Left Result Date: 05/26/2024 CLINICAL DATA:  Fall. EXAM: LEFT TIBIA AND FIBULA - 2 VIEW COMPARISON:  None Available. FINDINGS: Only AP image could be obtained. There is no evidence of fracture or other focal bone lesions. Soft tissues are unremarkable. IMPRESSION: No definite abnormality seen on this AP image only. Electronically Signed   By: Lynwood Landy Raddle M.D.   On: 05/26/2024 17:29   DG FEMUR MIN 2 VIEWS LEFT Result Date: 05/26/2024 CLINICAL DATA:  Fall. EXAM: LEFT FEMUR 2 VIEWS COMPARISON:  None Available. FINDINGS: Status post left total hip arthroplasty. Moderately angulated fracture of the midshaft of left femur is noted. IMPRESSION: Moderately angulated midshaft fracture of left femur. Electronically Signed   By: Lynwood Landy Raddle M.D.   On: 05/26/2024 17:28   DG Pelvis 1-2 Views Result Date: 05/26/2024 CLINICAL DATA:  Fall. EXAM: PELVIS - 1-2 VIEW COMPARISON:  September 05, 2021. FINDINGS: Status post left total hip arthroplasty. No fracture or dislocation is noted. IMPRESSION: No acute abnormality seen. Electronically Signed   By: Lynwood Landy Raddle M.D.   On: 05/26/2024 17:27   DG Chest 1 View Result Date: 05/26/2024 CLINICAL DATA:  Weakness and fall. EXAM: CHEST  1 VIEW COMPARISON:  Chest radiograph dated 10/14/2023. FINDINGS:  Right-sided Port-A-Cath with tip at the cavoatrial junction. No focal consolidation, pleural effusion, pneumothorax. Stable cardiac silhouette.  No acute osseous pathology. IMPRESSION: No active disease. Electronically Signed   By: Vanetta Chou M.D.   On: 05/26/2024 17:24   CT ABDOMEN PELVIS W CONTRAST Result Date: 05/21/2024 CLINICAL DATA:  Right flank pain EXAM: CT ABDOMEN AND PELVIS WITH CONTRAST TECHNIQUE: Multidetector CT imaging of the abdomen and pelvis was performed using the standard protocol following bolus administration of intravenous contrast. RADIATION DOSE REDUCTION: This exam was performed according to the departmental dose-optimization program which includes automated exposure control, adjustment of the mA and/or kV according to patient size and/or use of iterative reconstruction technique. CONTRAST:  OMNIPAQUE  IOHEXOL  300 MG/ML  SOLN COMPARISON:  10/12/2023 FINDINGS: Lower chest: No acute findings Hepatobiliary: No focal hepatic abnormality. Gallbladder unremarkable. Pancreas: No focal abnormality or ductal dilatation. Spleen: No focal abnormality.  Normal size. Adrenals/Urinary Tract: Adrenal glands are normal. Bilateral hydronephrosis, right greater than left. The urinary bladder is decompressed and the distal ureters are obscured by beam hardening artifact from left hip replacement, but no visible obstructing stone. No renal mass. Stomach/Bowel: Stomach and small bowel decompressed, unremarkable. Left lower quadrant diverting loop colostomy noted. Suggestion of low-density rectal mass again noted, difficult to visualize due to beam hardening artifact from left hip replacement, measuring approximately 4.5 x 3.0 cm. Vascular/Lymphatic: Diffuse aortoiliac atherosclerosis. No evidence of aneurysm or adenopathy. Reproductive: Prior hysterectomy.  No adnexal masses. Other: No free fluid or free air. Musculoskeletal: Prior left hip replacement. No aggressive appearing focal bone lesion or  acute bony abnormality. IMPRESSION: Bilateral hydronephrosis, right greater than left. The ureters are dilated into the pelvis without visible obstructing stone. However, the distal course of the ureters as well as the urinary bladder are obscured by beam hardening artifact from left hip replacement. There appears to be a low-density mass in the region of the rectum, also partially obscured by beam hardening artifact. This could be related to the patient's rectal cancer and the cause of the bilateral hydronephrosis. Left lower quadrant loop diverting colostomy noted. No evidence of bowel obstruction. As seen before, concern for rectal mass as described above, partially obscured by beam hardening artifact from left hip replacement. Aortic atherosclerosis. Electronically Signed   By: Franky Crease M.D.   On: 05/21/2024 15:16     Discharge Exam: Vitals:   06/08/24 0822 06/08/24 1140  BP: (!) 112/42 (!) 150/42  Pulse: 99 68  Resp: 14 20  Temp: 98.1 F (36.7 C) 98.5 F (36.9 C)  SpO2: 93% 95%   Vitals:   06/08/24 0000 06/08/24 0404 06/08/24 0822 06/08/24 1140  BP: (!) 131/43 (!) 118/41 (!) 112/42 (!) 150/42  Pulse: 66 60 99 68  Resp: 16 17 14 20   Temp:  97.9 F (36.6 C) 98.1 F (36.7 C) 98.5 F (36.9 C)  TempSrc:  Oral Oral Oral  SpO2:  96% 93% 95%  Weight:      Height:        General: Pt is alert, awake, not in acute distress Cardiovascular: RRR, S1/S2 +, no rubs, no gallops Respiratory: CTA bilaterally, no wheezing, no rhonchi Abdominal: Soft, NT, ND, bowel sounds + Extremities: no edema, no cyanosis    The results of significant diagnostics from this hospitalization (including imaging, microbiology, ancillary and laboratory) are listed below for reference.     Microbiology: No results found for this or any previous visit (from the past 240 hours).   Labs: BNP (last 3 results) No results for input(s): BNP in the last 8760 hours. Basic Metabolic Panel: Recent Labs  Lab  06/02/24 0425 06/03/24 0500 06/05/24 0500 06/07/24 0945 06/08/24 0317  NA 138  --  139  --  138  K 4.4  --  4.1  --  4.6  CL 102  --  103  --  101  CO2 28  --  27  --  27  GLUCOSE 181*  --  201*  --  199*  BUN 19  --  21  --  23  CREATININE 0.80  --  0.74  --  0.72  CALCIUM  8.3*  --  8.2*  --  8.7*  MG  --  1.7 1.3* 1.5*  --    Liver Function Tests: No results for input(s): AST, ALT, ALKPHOS, BILITOT, PROT, ALBUMIN in the last 168 hours. No results for input(s): LIPASE, AMYLASE in the last 168 hours. No results for input(s): AMMONIA in the last 168 hours. CBC: Recent Labs  Lab 06/02/24 0425 06/03/24 0500 06/05/24 0500 06/08/24 0317  WBC 6.3 6.6 6.8 7.4  NEUTROABS 4.7 4.9 4.8 5.5  HGB 8.4* 8.9* 8.2* 8.9*  HCT 28.0* 29.7* 27.5* 29.8*  MCV 87.0 88.1 87.6 87.4  PLT 189 223 239 287   Cardiac Enzymes: No results for input(s): CKTOTAL, CKMB, CKMBINDEX, TROPONINI in the last 168 hours. BNP: Invalid input(s): POCBNP CBG: Recent Labs  Lab 06/07/24 1156 06/07/24 1627 06/07/24 2108 06/08/24 0609 06/08/24 1138  GLUCAP 249* 368* 93 201* 230*   D-Dimer No results for input(s): DDIMER in the last 72 hours. Hgb A1c No results for input(s): HGBA1C in the last 72 hours. Lipid Profile No results for input(s): CHOL, HDL, LDLCALC, TRIG, CHOLHDL, LDLDIRECT in the last 72 hours. Thyroid  function studies No results for input(s): TSH, T4TOTAL, T3FREE, THYROIDAB in the last 72 hours.  Invalid input(s): FREET3 Anemia work up No results for input(s): VITAMINB12, FOLATE, FERRITIN, TIBC, IRON, RETICCTPCT in the last 72 hours. Urinalysis    Component Value Date/Time   COLORURINE YELLOW 06/07/2024 1916   APPEARANCEUR HAZY (A) 06/07/2024 1916   LABSPEC 1.020 06/07/2024 1916   PHURINE 5.0 06/07/2024 1916   GLUCOSEU 50 (A) 06/07/2024 1916   HGBUR SMALL (A) 06/07/2024 1916   BILIRUBINUR NEGATIVE 06/07/2024 1916    KETONESUR NEGATIVE 06/07/2024 1916   PROTEINUR 100 (A) 06/07/2024 1916   UROBILINOGEN 0.2 05/27/2014 1203   NITRITE NEGATIVE 06/07/2024 1916   LEUKOCYTESUR MODERATE (A) 06/07/2024 1916   Sepsis Labs Recent Labs  Lab 06/02/24 0425 06/03/24 0500 06/05/24 0500 06/08/24 0317  WBC 6.3 6.6 6.8 7.4   Microbiology No results found for this or any previous visit (from the past 240 hours).  FURTHER DISCHARGE INSTRUCTIONS:   Get Medicines reviewed and adjusted: Please take all your medications with you for your next visit with your Primary MD   Laboratory/radiological data: Please request your Primary MD to go over all hospital tests and procedure/radiological results at the follow up, please ask your Primary MD to get all Hospital records sent to his/her office.   In some cases, they will be blood work, cultures and biopsy results pending at the time of your discharge. Please request that your primary care M.D. goes through all the records of your hospital data and follows up on these results.   Also Note the following: If you experience worsening of your admission symptoms, develop shortness of breath, life threatening emergency, suicidal or homicidal thoughts you must seek medical attention immediately by calling 911 or calling your MD immediately  if symptoms less severe.   You  must read complete instructions/literature along with all the possible adverse reactions/side effects for all the Medicines you take and that have been prescribed to you. Take any new Medicines after you have completely understood and accpet all the possible adverse reactions/side effects.    patient was instructed, not to drive, operate heavy machinery, perform activities at heights, swimming or participation in water activities or provide baby-sitting services while on Pain, Sleep and Anxiety Medications; until their outpatient Physician has advised to do so again. Also recommended to not to take more than  prescribed Pain, Sleep and Anxiety Medications.  It is not advisable to combine anxiety, sleep and pain medications without talking with your primary care provider.     Wear Seat belts while driving.   Please note: You were cared for by a hospitalist during your hospital stay. Once you are discharged, your primary care physician will handle any further medical issues. Please note that NO REFILLS for any discharge medications will be authorized once you are discharged, as it is imperative that you return to your primary care physician (or establish a relationship with a primary care physician if you do not have one) for your post hospital discharge needs so that they can reassess your need for medications and monitor your lab values  Time coordinating discharge: Over 30 minutes  SIGNED:   Fredia Skeeter, MD  [facility] 06/08/2024, 12:50 PM *Please note that this is a verbal dictation therefore any spelling or grammatical errors are due to the Dragon Medical One system interpretation. If 7PM-7AM, please contact night-coverage [email or web address]

## 2024-06-07 NOTE — Progress Notes (Signed)
 Upon reassessment,RN found pt de accessed her port,pt looks confused and she state she thought she was going home and pulled it of  ,son present at bedside,MD notified ,IV team consultation done

## 2024-06-07 NOTE — TOC Progression Note (Signed)
 Transition of Care [facility]) - Progression Note    Patient Details  Name: [name] MRN: [id number] Date of Birth: 1946-11-10  Transition of Care [facility]) CM/SW Contact  Montie LOISE Louder, [place] Phone Number: 06/07/2024, 1:40 PM  Clinical Narrative:     Insurance pending for [employer]   Expected Discharge Plan: Skilled Nursing Facility Barriers to Discharge: English as a second language teacher  Expected Discharge Plan and Services   Discharge Planning Services: CM Consult Post Acute Care Choice: Long Term Acute Care (LTAC), Skilled Nursing Facility Living arrangements for the past 2 months: Single Family Home                             HH Agency: [facility]         Social Determinants of Health (SDOH) Interventions SDOH Screenings   Food Insecurity: No Food Insecurity (05/27/2024)  Housing: Low Risk  (05/27/2024)  Transportation Needs: No Transportation Needs (05/27/2024)  Utilities: Not At Risk (05/27/2024)  Depression (PHQ2-9): Low Risk  (01/17/2020)  Social Connections: Moderately Integrated (05/27/2024)  Tobacco Use: Medium Risk (05/27/2024)    Readmission Risk Interventions    10/15/2023    9:49 AM 09/11/2023   11:36 AM 12/02/2021    1:55 PM  Readmission Risk Prevention Plan  Transportation Screening Complete Complete Complete  PCP or Specialist Appt within 3-5 Days  Complete   HRI or Home Care Consult  Complete   Social Work Consult for Recovery Care Planning/Counseling  Complete   Palliative Care Screening  Not Applicable   Medication Review Oceanographer) Referral to Pharmacy Complete Complete  PCP or Specialist appointment within 3-5 days of discharge Complete  Complete  HRI or Home Care Consult Complete  Complete  SW Recovery Care/Counseling Consult Complete  Complete  Palliative Care Screening Not Applicable  Not Applicable  Skilled Nursing Facility Not Applicable  Patient Refused

## 2024-06-07 NOTE — Plan of Care (Signed)
  Problem: Education: Goal: Knowledge of General Education information will improve Description: Including pain rating scale, medication(s)/side effects and non-pharmacologic comfort measures Outcome: Progressing   Problem: Clinical Measurements: Goal: Ability to maintain clinical measurements within normal limits will improve Outcome: Progressing Goal: Will remain free from infection Outcome: Progressing Goal: Diagnostic test results will improve Outcome: Progressing Goal: Respiratory complications will improve Outcome: Progressing Goal: Cardiovascular complication will be avoided Outcome: Progressing   Problem: Activity: Goal: Risk for activity intolerance will decrease Outcome: Progressing   Problem: Nutrition: Goal: Adequate nutrition will be maintained Outcome: Progressing   Problem: Elimination: Goal: Will not experience complications related to bowel motility Outcome: Progressing Goal: Will not experience complications related to urinary retention Outcome: Progressing   Problem: Safety: Goal: Ability to remain free from injury will improve Outcome: Progressing   Problem: Skin Integrity: Goal: Risk for impaired skin integrity will decrease Outcome: Progressing   Problem: Coping: Goal: Ability to adjust to condition or change in health will improve Outcome: Progressing   Problem: Fluid Volume: Goal: Ability to maintain a balanced intake and output will improve Outcome: Progressing   Problem: Metabolic: Goal: Ability to maintain appropriate glucose levels will improve Outcome: Progressing   Problem: Nutritional: Goal: Maintenance of adequate nutrition will improve Outcome: Progressing   Problem: Skin Integrity: Goal: Risk for impaired skin integrity will decrease Outcome: Progressing   Problem: Tissue Perfusion: Goal: Adequacy of tissue perfusion will improve Outcome: Progressing

## 2024-06-07 NOTE — Progress Notes (Signed)
 PROGRESS NOTE    [name]  FMW:[id number] DOB: May 19, 1946 DOA: 05/26/2024 PCP: Waylan Almarie SAUNDERS, MD   Brief Narrative:  [name] is a 78 y.o. female with medical history significant for T2DM, HLD, HTN, cervical and rectal cancer s/p colostomy, PAD, diabetic foot and heel ulcer,, neuropathy, hypothyroidism, hypomagnesemia, anemia and arthritis who presented to the ED after a fall at home and found to have a moderately angulated midshaft fracture of the left femur.     At baseline, patient ambulates with a walker however recently, she has been losing strength in her legs. Patient has chronic lower extremity swelling and neuropathy but has had recent increased redness to her legs, L>R.  She she has had poor appetite but denies any dizziness, recent illness, fevers, chills, nausea or vomiting. She was recently treated for possible UTI about a week ago.   **Interim History: She was taken for surgical intervention and postoperatively she was very somnolent and had a new oxygen requirement.  We transferred her to the PCU and she was weaned off but then had to be placed back on Supplemental O2 via [place] when she was sleeping.  Has a diabetic foot ulcer and Underwent aortogram of the Bilaterl LE and had a Left Leg Run-off on 05/30/24. She had a L common iliac artery stent placed.  PT OT recommended CIR but per CIR, she is not candidate for CIR, SNF was recommended but family does not want SNF, now referral has been sent to LTAC.  Assessment & Plan:   Principal Problem:   Femur fracture, left (HCC) Active Problems:   Fall at home, initial encounter   Cellulitis of both lower extremities  Mechanical fall and  Left femur fracture s/p Fall:  Orthopedic surgery consulted and she is s/p ORIF now. Ortho recommending weightbearing as tolerated and reinforce the dressings as needed.  They have initiated on enoxaparin  for VTE prophylaxis.  PT OT recommended CIR but per CIR, she is not a candidate  for them and they recommended SNF but family wants LTAC which has been denied.  SNF is the next option which family will have to pursue.  TOC working, Landscape architect authorization at the moment.  Lower Extremity Cellulitis: Patient with chronic lower extremity edema found to have mild cellulitis of the lower extremities but now has surgical intervention and Leg is wrapper; patient received cefazolin  for 12 days.   Acute Respiratory Failure w/ Hypoxia: Post Op and ? Related to Anesthesia however was hypoxic overnight few nights ago and had to be placed back on Supplemental O2.  Has been weaned to room air and she is comfortable.  Could have been due to atelectasis.   Type 2 Diabetes Mellitus complicated by Neuropathy:  Last A1c 6.5% 3 months ago.  PTA medications metformin , which is on hold.  Currently on Lantus  15 units and SSI.  Blood sugar labile, for that reason, I will not make any changes today.   Diabetic Foot Ulcer / Nonhealing pressure ulcer/chronic foot pain:  Patient with full-thickness ulcer at the posterior left heel with large eschar (see media tab);  No significant improvement in left heel ulcer over the last few weeks.  Followed by podiatry and wound care. Wound care consulted, appreciate recs; See below for LLE Common iliac Intervention. Will likely need Podiatry/Orth to evaluate for debridement once she heals from her Hip Sx. The C is recommending IR team providing the patient a healing shoe from the [employer] and has been placed orders.  I increased her long-acting oxycodone  to 20 mg twice daily on the afternoon of 06/01/2024 and since then, her pain appears to be well-controlled and she has not required any as needed oxycodone  in the last 2 to 3 days.   PAD: ABIs 02/16/2024 are 0.85 on the right biphasic and 1.01 on the left biphasic.  Follows vascular surgery as outpatient.  Vascular surgery consulted by Ortho in regards to possible intervention during this hospitalization; Underwent  Aortogram/Arteriogram of Bilaterl LE and had stenting of the L Common iliac Artery and was given Plavix  load; Now on Clopidogrel  75 mg po daily w/ Breakfast -Continue Atorvastatin  as below   Hydronephrosis: Improving CT A/P 5 days ago showed bilateral hydronephrosis, R>L but no visible obstructing stone; Hydronephrosis thought to be due to patient's rectal cancer. Checked Renal U/S and showed  Mild right hydronephrosis which hasimproved since prior CT examination and Interval resolution of left Hydronephrosis. There was also Moderate right and mild left renal cortical atrophy. Will hold off Urological Consult given her improvement    Chronic Hypomagnesemia:  Unclear etiology, receives intermittent IV magnesium  by cancer center.  Magnesium  down to 1.3 again today.  Will replenish.   Essential HTN:  BP very well-controlled, remains on IV as needed hydralazine .   Cervical Cancer / Rectal cancer s/p colostomy: Followed closely with Oncology, patient has declined further chemotherapy or surgery for removal of the remaining tumor.  Continue Vitamin Supplementation   Normocytic Anemia / IDA:  Baseline 8-9; workup indicates iron deficiency anemia.  Continue iron supplementation w/ Ferrous Sulfate  325 mg po MWF.    Chronic BLE edema: Thought to be due to lymphedema;  Apply TED hose however her left leg is now wrapped from her surgery   HLD: C/w Atorvastatin  40 mg.   Hypothyroidism: C/w Levothyroxine  137 mcg po Daily    GERD/GI Prophylaxis: Continue home PPI w/ Pantoprazole  40 mg po Daily   Mood Disorder:  Continue Sertraline  150 mg po Daily   Constipation: Resolved.  Continue bowel regimen.   Perineal/groin and Buttock Wounds: WOC Nurse Consulted.  No more issues for last few days.   Class I Obesity: Complicates overall prognosis and care. Estimated body mass index is 32.12 kg/m as calculated from the following:   Height as of this encounter: 5' (1.524 m).   Weight as of this encounter: 74.6 kg.  Weight Loss and Dietary Counseling given   DVT prophylaxis: Place TED hose Start: 05/26/24 2205 SCDs Start: 05/26/24 1943   Code Status: Full Code  Family Communication: None present at bedside.  Plan of care discussed with patient in length and he/she verbalized understanding and agreed with it.  Discussed with her daughter over the speaker phone while I was still in her room.  Status is: Inpatient Remains inpatient appropriate because: Medically stable since 06/01/2024, pending placement to SNF   Estimated body mass index is 32.12 kg/m as calculated from the following:   Height as of this encounter: 5' (1.524 m).   Weight as of this encounter: 74.6 kg.    Nutritional Assessment: Body mass index is 32.12 kg/m.SABRA Seen by dietician.  I agree with the assessment and plan as outlined below: Nutrition Status:        . Skin Assessment: I have examined the patient's skin and I agree with the wound assessment as performed by the wound care RN as outlined below:    Consultants:  Vascular surgery and orthopedics  Procedures:  As above  Antimicrobials:  Anti-infectives (From admission,  onward)    Start     Dose/Rate Route Frequency Ordered Stop   05/27/24 1410  vancomycin  (VANCOCIN ) powder  Status:  Discontinued          As needed 05/27/24 1410 05/27/24 1528   05/26/24 2245  ceFAZolin  (ANCEF ) IVPB 1 g/50 mL premix  Status:  Discontinued        1 g 100 mL/hr over 30 Minutes Intravenous Every 8 hours 05/26/24 2150 06/06/24 1025         Subjective: Patient seen and examined.  She has no complaints.  Pain very well-controlled.  Objective: Vitals:   06/06/24 2353 06/07/24 0428 06/07/24 0749 06/07/24 1157  BP: (!) 143/44 (!) 138/50 (!) 138/42 (!) 152/39  Pulse: 63 63 61 65  Resp: 18 18 20 16   Temp: 98.3 F (36.8 C) 98.2 F (36.8 C) 98.3 F (36.8 C) 98.1 F (36.7 C)  TempSrc: Oral Oral Oral Oral  SpO2: 95% 94% 95% 97%  Weight:      Height:        Intake/Output  Summary (Last 24 hours) at 06/07/2024 1412 Last data filed at 06/07/2024 0933 Gross per 24 hour  Intake 490 ml  Output 1000 ml  Net -510 ml   Filed Weights   05/27/24 1130 05/28/24 0353 05/30/24 0352  Weight: 70 kg 73.9 kg 74.6 kg    Examination:  General exam: Appears calm and comfortable  Respiratory system: Clear to auscultation. Respiratory effort normal. Cardiovascular system: S1 & S2 heard, RRR. No JVD, murmurs, rubs, gallops or clicks. No pedal edema. Gastrointestinal system: Abdomen is nondistended, soft and nontender. No organomegaly or masses felt. Normal bowel sounds heard. Central nervous system: Alert and oriented. No focal neurological deficits. Extremities: Symmetric 5 x 5 power. Skin: No rashes, lesions or ulcers.  Psychiatry: Judgement and insight appear normal. Mood & affect appropriate.    Data Reviewed: I have personally reviewed following labs and imaging studies  CBC: Recent Labs  Lab 06/01/24 0916 06/02/24 0425 06/03/24 0500 06/05/24 0500  WBC 8.5 6.3 6.6 6.8  NEUTROABS 6.9 4.7 4.9 4.8  HGB 9.7* 8.4* 8.9* 8.2*  HCT 32.1* 28.0* 29.7* 27.5*  MCV 85.8 87.0 88.1 87.6  PLT 210 189 223 239   Basic Metabolic Panel: Recent Labs  Lab 06/01/24 0915 06/01/24 0916 06/02/24 0425 06/03/24 0500 06/05/24 0500 06/07/24 0945  NA  --  138 138  --  139  --   K  --  4.4 4.4  --  4.1  --   CL  --  102 102  --  103  --   CO2  --  26 28  --  27  --   GLUCOSE  --  224* 181*  --  201*  --   BUN  --  18 19  --  21  --   CREATININE  --  0.82 0.80  --  0.74  --   CALCIUM   --  8.6* 8.3*  --  8.2*  --   MG 1.5*  --   --  1.7 1.3* 1.5*   GFR: Estimated Creatinine Clearance: 52.2 mL/min (by C-G formula based on SCr of 0.74 mg/dL). Liver Function Tests: No results for input(s): AST, ALT, ALKPHOS, BILITOT, PROT, ALBUMIN in the last 168 hours.  No results for input(s): LIPASE, AMYLASE in the last 168 hours. No results for input(s): AMMONIA in the  last 168 hours. Coagulation Profile: No results for input(s): INR, PROTIME in the last 168 hours.  Cardiac Enzymes: No results for input(s): CKTOTAL, CKMB, CKMBINDEX, TROPONINI in the last 168 hours. BNP (last 3 results) No results for input(s): PROBNP in the last 8760 hours. HbA1C: No results for input(s): HGBA1C in the last 72 hours. CBG: Recent Labs  Lab 06/06/24 1136 06/06/24 1620 06/06/24 2120 06/07/24 0554 06/07/24 1156  GLUCAP 283* 380* 100* 212* 249*   Lipid Profile: No results for input(s): CHOL, HDL, LDLCALC, TRIG, CHOLHDL, LDLDIRECT in the last 72 hours.  Thyroid  Function Tests: No results for input(s): TSH, T4TOTAL, FREET4, T3FREE, THYROIDAB in the last 72 hours. Anemia Panel: No results for input(s): VITAMINB12, FOLATE, FERRITIN, TIBC, IRON, RETICCTPCT in the last 72 hours. Sepsis Labs: No results for input(s): PROCALCITON, LATICACIDVEN in the last 168 hours.  No results found for this or any previous visit (from the past 240 hours).    Radiology Studies: No results found.  Scheduled Meds:  atorvastatin   40 mg Oral QHS   baclofen   10 mg Oral BID   Chlorhexidine  Gluconate Cloth  6 each Topical Daily   cholecalciferol   2,000 Units Oral Daily   clopidogrel   75 mg Oral Q breakfast   clotrimazole    Topical Daily   cyanocobalamin   1,000 mcg Oral Daily   feeding supplement  237 mL Oral TID BM   ferrous sulfate   325 mg Oral Q M,W,F   gabapentin   300 mg Oral QHS   gabapentin   600 mg Oral Daily   guaiFENesin   1,200 mg Oral BID   insulin  aspart  0-20 Units Subcutaneous TID WC   insulin  aspart  0-5 Units Subcutaneous QHS   insulin  glargine-yfgn  15 Units Subcutaneous Daily   leptospermum manuka honey  1 Application Topical Daily   levothyroxine   137 mcg Oral Q0600   magnesium  oxide  400 mg Oral BID   multivitamin with minerals  1 tablet Oral Q breakfast   oxyCODONE   20 mg Oral Q12H   pantoprazole   40 mg  Oral QAC breakfast   polyethylene glycol  17 g Oral BID   sertraline   150 mg Oral QHS   sodium chloride  flush  3 mL Intravenous Q12H   Continuous Infusions:     LOS: 12 days   Fredia Skeeter, MD [facility]  06/07/2024, 2:12 PM   *Please note that this is a verbal dictation therefore any spelling or grammatical errors are due to the Dragon Medical One system interpretation.  Please page via Amion and do not message via secure chat for urgent patient care matters. Secure chat can be used for non urgent patient care matters.  How to contact the [facility] Attending or Consulting provider 7A - 7P or covering provider during after hours 7P -7A, for this patient?  Check the care team in [facility] and look for a) attending/consulting [facility] provider listed and b) the [facility] team listed. Page or secure chat 7A-7P. Log into [email or web address] and use [employer]'s universal password to access. If you do not have the password, please contact the hospital operator. Locate the [facility] provider you are looking for under [facility] and page to a number that you can be directly reached. If you still have difficulty reaching the provider, please page the [facility] (Director on Call) for the Hospitalists listed on amion for assistance.

## 2024-06-07 NOTE — Inpatient Diabetes Management (Signed)
 Inpatient Diabetes Program Recommendations  AACE/ADA: New Consensus Statement on Inpatient Glycemic Control (2015)  Target Ranges:  Prepandial:   less than 140 mg/dL      Peak postprandial:   less than 180 mg/dL (1-2 hours)      Critically ill patients:  140 - 180 mg/dL   Lab Results  Component Value Date   GLUCAP 212 (H) 06/07/2024   HGBA1C 6.2 (H) 05/27/2024    Review of Glycemic Control  Latest Reference Range & Units 06/05/24 06:28 06/05/24 11:40 06/05/24 16:45 06/05/24 21:12 06/06/24 06:26 06/06/24 11:36 06/06/24 16:20 06/06/24 21:20 06/07/24 05:54  Glucose-Capillary 70 - 99 mg/dL 793 (H) 761 (H) 772 (H) 240 (H) 189 (H) 283 (H) 380 (H) 100 (H) 212 (H)  (H): Data is abnormally high Diabetes history: Type 2 DM Outpatient Diabetes medications: Metformin  750 mg BID Current orders for Inpatient glycemic control: Novolog  0-20 units TID & HS, Semglee  15 units QD  Inpatient Diabetes Program Recommendations:    Consider adding Novolog  3 units TID (assuming patient consuming >50% of meal)  Thanks, Tinnie Minus, MSN, RNC-OB Diabetes Coordinator [phone] (8a-5p)

## 2024-06-07 NOTE — Progress Notes (Signed)
 PT Cancellation Note  Patient Details Name: ANYSIA CHOI MRN: [id number] DOB: [birth year]   Cancelled Treatment:    Reason Eval/Treat Not Completed: (P) Other (comment) (IV team in room) Will continue efforts next date per PT plan of care as schedule permits.  Connell HERO Gunter Conde 06/07/2024, 5:09 PM

## 2024-06-07 NOTE — Progress Notes (Signed)
   06/07/24 1721  Implanted Port Right Chest  Removal Time: (c)   No placement date or time found.   Orientation: Right  Location: Chest  Site Assessment Clean, Dry, Intact;Bruised  Port Status Deaccessed - Gauze Dressing Placed  Line Status Heparin  locked;Other (Comment) (see PN)    IVT consult placed after pt. inadvertently de-accessed PAC.  Discussed with primary RN, given the risk of pt pulling at line again and possible discharge tomorrow, plan is to keep line de-accessed after accessing briefly to heparin  lock.  PIV already obtained by primary RN.  Spoke with patient and patient's son, all agreeable to the plan.  Line accessed, flushed with GBR, heparin  locked and de-accessed without difficulty.

## 2024-06-08 ENCOUNTER — Other Ambulatory Visit: Payer: Self-pay

## 2024-06-08 DIAGNOSIS — S7292XA Unspecified fracture of left femur, initial encounter for closed fracture: Secondary | ICD-10-CM | POA: Diagnosis not present

## 2024-06-08 DIAGNOSIS — I70244 Atherosclerosis of native arteries of left leg with ulceration of heel and midfoot: Secondary | ICD-10-CM

## 2024-06-08 LAB — CBC WITH DIFFERENTIAL/PLATELET
Abs Immature Granulocytes: 0.09 K/uL — ABNORMAL HIGH (ref 0.00–0.07)
Basophils Absolute: 0 K/uL (ref 0.0–0.1)
Basophils Relative: 0 %
Eosinophils Absolute: 0.1 K/uL (ref 0.0–0.5)
Eosinophils Relative: 1 %
HCT: 29.8 % — ABNORMAL LOW (ref 36.0–46.0)
Hemoglobin: 8.9 g/dL — ABNORMAL LOW (ref 12.0–15.0)
Immature Granulocytes: 1 %
Lymphocytes Relative: 14 %
Lymphs Abs: 1 K/uL (ref 0.7–4.0)
MCH: 26.1 pg (ref 26.0–34.0)
MCHC: 29.9 g/dL — ABNORMAL LOW (ref 30.0–36.0)
MCV: 87.4 fL (ref 80.0–100.0)
Monocytes Absolute: 0.6 K/uL (ref 0.1–1.0)
Monocytes Relative: 8 %
Neutro Abs: 5.5 K/uL (ref 1.7–7.7)
Neutrophils Relative %: 76 %
Platelets: 287 K/uL (ref 150–400)
RBC: 3.41 MIL/uL — ABNORMAL LOW (ref 3.87–5.11)
RDW: 17 % — ABNORMAL HIGH (ref 11.5–15.5)
WBC: 7.4 K/uL (ref 4.0–10.5)
nRBC: 0 % (ref 0.0–0.2)

## 2024-06-08 LAB — BASIC METABOLIC PANEL WITH GFR
Anion gap: 10 (ref 5–15)
BUN: 23 mg/dL (ref 8–23)
CO2: 27 mmol/L (ref 22–32)
Calcium: 8.7 mg/dL — ABNORMAL LOW (ref 8.9–10.3)
Chloride: 101 mmol/L (ref 98–111)
Creatinine, Ser: 0.72 mg/dL (ref 0.44–1.00)
GFR, Estimated: >60 mL/min (ref >60)
Glucose, Bld: 199 mg/dL — ABNORMAL HIGH (ref 70–99)
Potassium: 4.6 mmol/L (ref 3.5–5.1)
Sodium: 138 mmol/L (ref 135–145)

## 2024-06-08 LAB — GLUCOSE, CAPILLARY
Glucose-Capillary: 201 mg/dL — ABNORMAL HIGH (ref 70–99)
Glucose-Capillary: 230 mg/dL — ABNORMAL HIGH (ref 70–99)

## 2024-06-08 NOTE — Progress Notes (Signed)
 Son at bedside at discharge. Report called to [facility] @ [address] [place], [place] 06/08/2024 2:00 PM

## 2024-06-08 NOTE — Plan of Care (Signed)
  Problem: Education: Goal: Knowledge of General Education information will improve Description: Including pain rating scale, medication(s)/side effects and non-pharmacologic comfort measures Outcome: Adequate for Discharge   Problem: Health Behavior/Discharge Planning: Goal: Ability to manage health-related needs will improve Outcome: Adequate for Discharge   Problem: Clinical Measurements: Goal: Ability to maintain clinical measurements within normal limits will improve Outcome: Adequate for Discharge Goal: Will remain free from infection Outcome: Adequate for Discharge Goal: Diagnostic test results will improve Outcome: Adequate for Discharge Goal: Respiratory complications will improve Outcome: Adequate for Discharge Goal: Cardiovascular complication will be avoided Outcome: Adequate for Discharge   Problem: Activity: Goal: Risk for activity intolerance will decrease Outcome: Adequate for Discharge   Problem: Nutrition: Goal: Adequate nutrition will be maintained Outcome: Adequate for Discharge   Problem: Coping: Goal: Level of anxiety will decrease Outcome: Adequate for Discharge   Problem: Elimination: Goal: Will not experience complications related to bowel motility Outcome: Adequate for Discharge Goal: Will not experience complications related to urinary retention Outcome: Adequate for Discharge   Problem: Pain Managment: Goal: General experience of comfort will improve and/or be controlled Outcome: Adequate for Discharge   Problem: Safety: Goal: Ability to remain free from injury will improve Outcome: Adequate for Discharge   Problem: Skin Integrity: Goal: Risk for impaired skin integrity will decrease Outcome: Adequate for Discharge   Problem: Education: Goal: Ability to describe self-care measures that may prevent or decrease complications (Diabetes Survival Skills Education) will improve Outcome: Adequate for Discharge Goal: Individualized Educational  Video(s) Outcome: Adequate for Discharge   Problem: Coping: Goal: Ability to adjust to condition or change in health will improve Outcome: Adequate for Discharge   Problem: Fluid Volume: Goal: Ability to maintain a balanced intake and output will improve Outcome: Adequate for Discharge   Problem: Health Behavior/Discharge Planning: Goal: Ability to identify and utilize available resources and services will improve Outcome: Adequate for Discharge Goal: Ability to manage health-related needs will improve Outcome: Adequate for Discharge   Problem: Metabolic: Goal: Ability to maintain appropriate glucose levels will improve Outcome: Adequate for Discharge   Problem: Nutritional: Goal: Maintenance of adequate nutrition will improve Outcome: Adequate for Discharge Goal: Progress toward achieving an optimal weight will improve Outcome: Adequate for Discharge   Problem: Skin Integrity: Goal: Risk for impaired skin integrity will decrease Outcome: Adequate for Discharge   Problem: Tissue Perfusion: Goal: Adequacy of tissue perfusion will improve Outcome: Adequate for Discharge   Problem: Education: Goal: Understanding of CV disease, CV risk reduction, and recovery process will improve Outcome: Adequate for Discharge Goal: Individualized Educational Video(s) Outcome: Adequate for Discharge   Problem: Activity: Goal: Ability to return to baseline activity level will improve Outcome: Adequate for Discharge   Problem: Cardiovascular: Goal: Ability to achieve and maintain adequate cardiovascular perfusion will improve Outcome: Adequate for Discharge Goal: Vascular access site(s) Level 0-1 will be maintained Outcome: Adequate for Discharge   Problem: Health Behavior/Discharge Planning: Goal: Ability to safely manage health-related needs after discharge will improve Outcome: Adequate for Discharge

## 2024-06-08 NOTE — Progress Notes (Signed)
 Orthopedic Tech Progress Note Patient Details:  [name] 04/08/1946 [id number]  Ortho Devices Type of Ortho Device: Postop shoe/boot Ortho Device/Splint Location: LLE Ortho Device/Splint Interventions: Ordered, Application, Adjustment, Removal   Post Interventions Patient Tolerated: Well Instructions Provided: Care of device  [name] Pac 06/08/2024, 12:14 PM

## 2024-06-08 NOTE — TOC Transition Note (Signed)
 Transition of Care [facility]) - Discharge Note   Patient Details  Name: [name] MRN: [id number] Date of Birth: 15-Jun-1946  [facility]) CM/SW Contact:  Luann SHAUNNA Cumming, LCSW Phone Number: 06/08/2024, 1:37 PM   Clinical Narrative:     Per MD patient ready for DC to [facility]. RN, patient, patient's family, and facility notified of DC. Discharge Summary and FL2 sent to facility. RN to call report prior to discharge [phone] ). DC packet on chart. Ambulance transport requested for patient.   CSW will sign off for now as social work intervention is no longer needed. Please consult us  again if new needs arise.   Final next level of care: Skilled Nursing Facility Barriers to Discharge: No Barriers Identified   Patient Goals and CMS Choice Patient states their goals for this hospitalization and ongoing recovery are:: wants to get better and recover   Choice offered to / list presented to : Patient, Adult Children       Discharge Plan and Services Additional resources added to the After Visit Summary for     Discharge Planning Services: CM Consult Post Acute Care Choice: Long Term Acute Care (LTAC), Skilled Nursing Facility                      [facility] Agency: [facility]        Social Drivers of Health (SDOH) Interventions SDOH Screenings   Food Insecurity: No Food Insecurity (05/27/2024)  Housing: Low Risk  (05/27/2024)  Transportation Needs: No Transportation Needs (05/27/2024)  Utilities: Not At Risk (05/27/2024)  Depression (PHQ2-9): Low Risk  (01/17/2020)  Social Connections: Moderately Integrated (05/27/2024)  Tobacco Use: Medium Risk (05/27/2024)     Readmission Risk Interventions    10/15/2023    9:49 AM 09/11/2023   11:36 AM 12/02/2021    1:55 PM  Readmission Risk Prevention Plan  Transportation Screening Complete Complete Complete  PCP or Specialist Appt within 3-5 Days  Complete   HRI or Home Care Consult  Complete    Social Work Consult for Recovery Care Planning/Counseling  Complete   Palliative Care Screening  Not Applicable   Medication Review Oceanographer) Referral to Pharmacy Complete Complete  PCP or Specialist appointment within 3-5 days of discharge Complete  Complete  HRI or Home Care Consult Complete  Complete  SW Recovery Care/Counseling Consult Complete  Complete  Palliative Care Screening Not Applicable  Not Applicable  Skilled Nursing Facility Not Applicable  Patient Refused

## 2024-06-08 NOTE — Progress Notes (Signed)
 Physical Therapy Treatment Patient Details Name: [name] MRN: [id number] DOB: [birth year] Today's Date: 06/08/2024   History of Present Illness 78 y.o female admitted 05/26/24 s/p fall in home with Lt periprosthetic femur fx. 7/11 S/p ORIF.  7/14 s/p stent of left common iliac artery for heel ulceration, still at high risk for LLE limb loss due to L heel wound per vascular MD note. PTA messaged vascular MD and ortho MD to follow-up on L post-op shoe to offload L heel 7/16 and 7/21. PMH: HLD, HTN, cervical and rectal CA s/p colostomy, PAD, neuropathy, hypothyroidism, arthritis, DM, CVA.    PT Comments  Pt received in supine after premedication for pain by RN, pt agreeable to therapy session and with good participation as able, needing slightly less lift assist for transfers this date compared with previous PT session. Pt with difficulty offloading RLE to initiate weight shifting for stepping while standing in RW at bedside, due to LLE pain and difficulty using BUE to press down through RW to offload RLE to step. Pt needing up to +2 maxA for sit<>stand from elevated bed<>RW and up to modA +2 to stand from more elevated bed<>Stedy and from [facility] flaps>chair. Pt c/o significant fatigue sitting in chair post-exertion. Discussed with nursing team and OT, make sure pt gets back to bed after 2-3 hours to ensure she is able to safely transfer back to bed before she is too fatigued and to prevent excess pressure on bony prominences as pt has difficulty weight shifting on her own for pressure relief. Patient will benefit from continued inpatient follow up therapy, <3 hours/day.     If plan is discharge home, recommend the following: Two people to help with walking and/or transfers;Assistance with cooking/housework;Assist for transportation;Help with stairs or ramp for entrance;Supervision due to cognitive status   Can travel by private vehicle     No  Equipment Recommendations  Wheelchair (measurements  PT);Wheelchair cushion (measurements PT);Hoyer lift;Hospital bed (air bed overlay for hospital bed)    Recommendations for Other Services       Precautions / Restrictions Precautions Precautions: Fall Recall of Precautions/Restrictions: Impaired Precaution/Restrictions Comments: LLE heel wound, Contact precs - ESBL Required Braces or Orthoses:  (PTA asked if pt can get order for L post-op shoe to offload her L heel wound, Dr Sheree agreeable if ortho agrees, ortho declined to comment.) Restrictions Weight Bearing Restrictions Per Provider Order: Yes LLE Weight Bearing Per Provider Order: Weight bearing as tolerated Other Position/Activity Restrictions: PTA messaged Vascular surgeon Sheree 7/16 to ask if pt can get post-op shoe ordered given heel wound, OK per MD if ortho agrees, PTA followed up with ortho PA 7/21 who declined to comment     Mobility  Bed Mobility Overal bed mobility: Needs Assistance Bed Mobility: Supine to Sit     Supine to sit: Mod assist, +2 for safety/equipment, HOB elevated, Used rails     General bed mobility comments: +2 safety due to pt pain/fatigue, pt pulling well on rail to sit up, increased time to perform    Transfers Overall transfer level: Needs assistance Equipment used: Rolling walker (2 wheels), Ambulation equipment used Transfers: Sit to/from Stand, Bed to chair/wheelchair/BSC Sit to Stand: Max assist, +2 physical assistance, From elevated surface, Via lift equipment, Mod assist           General transfer comment: EOB<>RW with +2 maxA, unable to deweight RLE for stepping, then used Stedy to transfer to chair with pt pulling up and modA +2 boost  assist to stand to [place] and from [place] flaps>chiar. Very fatigued once in chair. Transfer via Lift Equipment: Stedy  Ambulation/Gait               General Gait Details: Unable at this time.   Stairs             Wheelchair Mobility     Tilt Bed    Modified Rankin (Stroke  Patients Only)       Balance Overall balance assessment: Needs assistance Sitting-balance support: Bilateral upper extremity supported, Feet supported Sitting balance-Leahy Scale: Fair Sitting balance - Comments: continues with bil UE support and slight R lean; Poor unsupported   Standing balance support: Bilateral upper extremity supported, During functional activity Standing balance-Leahy Scale: Zero Standing balance comment: +2 maxA at RW and modA +2 to [employer] Communication: No apparent difficulties  Cognition Arousal: Alert Behavior During Therapy: Anxious, Impulsive   PT - Cognitive impairments: Attention, Sequencing, Safety/Judgement, Memory, Problem solving                       PT - Cognition Comments: Pt having trouble sequencing while standing. Moderate cues for pursed-lip breathing throughout, safer body mechanics and activity pacing. Following commands: Impaired Following commands impaired: Follows one step commands inconsistently, Follows one step commands with increased time    Cueing Cueing Techniques: Verbal cues, Gestural cues, Tactile cues  Exercises Other Exercises Other Exercises: cues for PLB x10 reps in nose/out mouth x2 sets    General Comments General comments (skin integrity, edema, etc.): pt LLE heel floated once in chair; would benefit from LLE post-op shoe to offload her L heel due to wound/pain      Pertinent Vitals/Pain Pain Assessment Pain Assessment: Faces Faces Pain Scale: Hurts even more Pain Location: Incision site on LLE knee/thigh and L heel where wound is located Pain Descriptors / Indicators: Aching, Grimacing, Guarding, Moaning, Sore, Sharp Pain Intervention(s): Limited activity within patient's tolerance, Monitored during session, Premedicated before session, Repositioned    Home Living                          Prior Function            PT  Goals (current goals can now be found in the care plan section) Acute Rehab PT Goals Patient Stated Goal: improve mobility and pain before returning home. PT Goal Formulation: With patient Time For Goal Achievement: 06/12/24 Progress towards PT goals: Progressing toward goals    Frequency    Min 2X/week      PT Plan      Co-evaluation              AM-PAC PT 6 Clicks Mobility   Outcome Measure  Help needed turning from your back to your side while in a flat bed without using bedrails?: A Lot Help needed moving from lying on your back to sitting on the side of a flat bed without using bedrails?: Total (needs bed rails/features) Help needed moving to and from a bed to a chair (including a wheelchair)?: Total Help needed standing up from a chair using your arms (e.g., wheelchair or bedside chair)?: Total (+2 maxA today) Help needed to walk in hospital room?: Total Help needed climbing 3-5 steps with a railing? :  Total 6 Click Score: 7    End of Session Equipment Utilized During Treatment: Gait belt Activity Tolerance: Patient limited by pain;Patient limited by fatigue Patient left: with call bell/phone within reach;with family/visitor present;Other (comment);in chair;with chair alarm set (heels floated, reclined in chair) Nurse Communication: Mobility status;Need for lift equipment;Other (comment) (use Stedy, OT to see later) PT Visit Diagnosis: Unsteadiness on feet (R26.81);Other abnormalities of gait and mobility (R26.89);Muscle weakness (generalized) (M62.81);Pain Pain - Right/Left: Left Pain - part of body: Hip;Ankle and joints of foot     Time: 1040-1105 PT Time Calculation (min) (ACUTE ONLY): 25 min  Charges:    $Therapeutic Activity: 23-37 mins PT General Charges $$ ACUTE PT VISIT: 1 Visit                     Jahid Weida P., PTA Acute Rehabilitation Services Secure Chat Preferred 9a-5:30pm Office: [phone]    Connell HERO [facility] 06/08/2024, 12:38 PM

## 2024-06-08 NOTE — Inpatient Diabetes Management (Signed)
 Inpatient Diabetes Program Recommendations  AACE/ADA: New Consensus Statement on Inpatient Glycemic Control (2015)  Target Ranges:  Prepandial:   less than 140 mg/dL      Peak postprandial:   less than 180 mg/dL (1-2 hours)      Critically ill patients:  140 - 180 mg/dL   Lab Results  Component Value Date   GLUCAP 230 (H) 06/08/2024   HGBA1C 6.2 (H) 05/27/2024    Review of Glycemic Control  Latest Reference Range & Units 06/07/24 05:54 06/07/24 11:56 06/07/24 16:27 06/07/24 21:08 06/08/24 06:09 06/08/24 11:38  Glucose-Capillary 70 - 99 mg/dL 787 (H) 750 (H) 631 (H) 93 201 (H) 230 (H)   Diabetes history: Type 2 DM Outpatient Diabetes medications: Metformin  750 mg BID Current orders for Inpatient glycemic control:  Novolog  0-20 units TID & HS Semglee  15 units Daily  Ensure High Protein (19 grams of carbohydrates) tid between meals  Inpatient Diabetes Program Recommendations:    -   Consider adding Novolog  3 units TID (assuming patient consuming >50% of meal)  Thanks, Clotilda Bull RN, MSN, BC-ADM Inpatient Diabetes Coordinator Team Pager [phone] (8a-5p)

## 2024-06-09 ENCOUNTER — Other Ambulatory Visit: Payer: Self-pay | Admitting: *Deleted

## 2024-06-09 ENCOUNTER — Encounter: Payer: Self-pay | Admitting: *Deleted

## 2024-06-09 NOTE — Progress Notes (Signed)
 Per Dr. Cloretta: Needs lab/OV in 2 weeks. Scheduling message sent .

## 2024-06-10 ENCOUNTER — Telehealth: Payer: Self-pay | Admitting: Oncology

## 2024-06-10 NOTE — Telephone Encounter (Signed)
 Spoke with PT's daughter about FU in 2 weeks. PT is in a nursing home with the shingles. PT will call back when she finds out about transportation from the nursing home.

## 2024-06-13 ENCOUNTER — Other Ambulatory Visit: Payer: Self-pay | Admitting: *Deleted

## 2024-06-13 DIAGNOSIS — I739 Peripheral vascular disease, unspecified: Secondary | ICD-10-CM

## 2024-06-13 DIAGNOSIS — I70244 Atherosclerosis of native arteries of left leg with ulceration of heel and midfoot: Secondary | ICD-10-CM

## 2024-06-14 ENCOUNTER — Telehealth: Payer: Self-pay | Admitting: *Deleted

## 2024-06-14 NOTE — Telephone Encounter (Signed)
 Called [facility] to f/u on referral placed on 02/22/24. Was told that she was scheduled and daughter cancelled the appointment due to her being in the hospital. Daughter will have to call back to reschedule. Called [name] and she reports Kathern is in [facility] for rehab for ~1-2 weeks. She will call them when she returns home. Reminded her of appointment with Dr. Cloretta in August. She will let SNF know date/time so they can transport

## 2024-06-22 ENCOUNTER — Ambulatory Visit: Admitting: Podiatry

## 2024-06-23 ENCOUNTER — Inpatient Hospital Stay

## 2024-06-23 ENCOUNTER — Inpatient Hospital Stay: Admitting: Oncology

## 2024-06-23 ENCOUNTER — Telehealth: Payer: Self-pay

## 2024-06-23 ENCOUNTER — Inpatient Hospital Stay: Attending: Nurse Practitioner

## 2024-06-23 VITALS — BP 130/54 | HR 64 | Temp 97.7°F | Resp 18 | Ht 60.0 in | Wt 158.0 lb

## 2024-06-23 DIAGNOSIS — D649 Anemia, unspecified: Secondary | ICD-10-CM

## 2024-06-23 DIAGNOSIS — Z85048 Personal history of other malignant neoplasm of rectum, rectosigmoid junction, and anus: Secondary | ICD-10-CM | POA: Insufficient documentation

## 2024-06-23 DIAGNOSIS — Z8541 Personal history of malignant neoplasm of cervix uteri: Secondary | ICD-10-CM | POA: Insufficient documentation

## 2024-06-23 DIAGNOSIS — Z9221 Personal history of antineoplastic chemotherapy: Secondary | ICD-10-CM | POA: Diagnosis not present

## 2024-06-23 DIAGNOSIS — C20 Malignant neoplasm of rectum: Secondary | ICD-10-CM

## 2024-06-23 DIAGNOSIS — E11621 Type 2 diabetes mellitus with foot ulcer: Secondary | ICD-10-CM | POA: Insufficient documentation

## 2024-06-23 DIAGNOSIS — Z8673 Personal history of transient ischemic attack (TIA), and cerebral infarction without residual deficits: Secondary | ICD-10-CM | POA: Insufficient documentation

## 2024-06-23 DIAGNOSIS — N1330 Unspecified hydronephrosis: Secondary | ICD-10-CM | POA: Diagnosis not present

## 2024-06-23 LAB — CBC WITH DIFFERENTIAL (CANCER CENTER ONLY)
Abs Immature Granulocytes: 0.06 K/uL (ref 0.00–0.07)
Basophils Absolute: 0 K/uL (ref 0.0–0.1)
Basophils Relative: 0 %
Eosinophils Absolute: 0 K/uL (ref 0.0–0.5)
Eosinophils Relative: 0 %
HCT: 31 % — ABNORMAL LOW (ref 36.0–46.0)
Hemoglobin: 9.3 g/dL — ABNORMAL LOW (ref 12.0–15.0)
Immature Granulocytes: 1 %
Lymphocytes Relative: 10 %
Lymphs Abs: 0.9 K/uL (ref 0.7–4.0)
MCH: 25.4 pg — ABNORMAL LOW (ref 26.0–34.0)
MCHC: 30 g/dL (ref 30.0–36.0)
MCV: 84.7 fL (ref 80.0–100.0)
Monocytes Absolute: 0.7 K/uL (ref 0.1–1.0)
Monocytes Relative: 8 %
Neutro Abs: 7.4 K/uL (ref 1.7–7.7)
Neutrophils Relative %: 81 %
Platelet Count: 212 K/uL (ref 150–400)
RBC: 3.66 MIL/uL — ABNORMAL LOW (ref 3.87–5.11)
RDW: 17 % — ABNORMAL HIGH (ref 11.5–15.5)
WBC Count: 9.1 K/uL (ref 4.0–10.5)
nRBC: 0 % (ref 0.0–0.2)

## 2024-06-23 LAB — CMP (CANCER CENTER ONLY)
ALT: 17 U/L (ref 0–44)
AST: 24 U/L (ref 15–41)
Albumin: 3.1 g/dL — ABNORMAL LOW (ref 3.5–5.0)
Alkaline Phosphatase: 149 U/L — ABNORMAL HIGH (ref 38–126)
Anion gap: 14 (ref 5–15)
BUN: 24 mg/dL — ABNORMAL HIGH (ref 8–23)
CO2: 22 mmol/L (ref 22–32)
Calcium: 8.8 mg/dL — ABNORMAL LOW (ref 8.9–10.3)
Chloride: 104 mmol/L (ref 98–111)
Creatinine: 0.74 mg/dL (ref 0.44–1.00)
GFR, Estimated: >60 mL/min (ref >60)
Glucose, Bld: 168 mg/dL — ABNORMAL HIGH (ref 70–99)
Potassium: 3.5 mmol/L (ref 3.5–5.1)
Sodium: 139 mmol/L (ref 135–145)
Total Bilirubin: 0.3 mg/dL (ref 0.0–1.2)
Total Protein: 6.2 g/dL — ABNORMAL LOW (ref 6.5–8.1)

## 2024-06-23 LAB — MAGNESIUM: Magnesium: 0.7 mg/dL — CL (ref 1.7–2.4)

## 2024-06-23 NOTE — Progress Notes (Signed)
 [facility] OFFICE PROGRESS NOTE   Diagnosis: Rectal cancer  INTERVAL HISTORY:   [name] was discharged in the hospital on 06/08/2024 after admission with a fall and femur fracture.  She underwent surgical repair of the femur fracture.  She has a nonhealing ulceration at the left heel.  She was discharged to a skilled nursing facility.  She reports that the heel was debrided yesterday at the nursing facility.  She denies bleeding. Ms. Deason reports intermittent loose output from the ostomy bag.   Objective:  Vital signs in last 24 hours:  Blood pressure (!) 130/54, pulse 64, temperature 97.7 F (36.5 C), temperature source Temporal, resp. rate 18, height 5' (1.524 m), weight 158 lb (71.7 kg), SpO2 100%.     Lymphatics: No cervical, supraclavicular, or inguinal nodes Resp: Lungs clear bilaterally Cardio: Regular rate and rhythm GI: Left abdomen colostomy with semiformed stool, no hepatomegaly Vascular: Edema throughout the left greater than right lower leg  Skin: Mild erythema at the left greater than right lower leg.  Left thigh and foot dressings  Portacath/PICC-without erythema  Lab Results:  Lab Results  Component Value Date   WBC 9.1 06/23/2024   HGB 9.3 (L) 06/23/2024   HCT 31.0 (L) 06/23/2024   MCV 84.7 06/23/2024   PLT 212 06/23/2024   NEUTROABS 7.4 06/23/2024    CMP  Lab Results  Component Value Date   NA 139 06/23/2024   K 3.5 06/23/2024   CL 104 06/23/2024   CO2 22 06/23/2024   GLUCOSE 168 (H) 06/23/2024   BUN 24 (H) 06/23/2024   CREATININE 0.74 06/23/2024   CALCIUM  8.8 (L) 06/23/2024   PROT 6.2 (L) 06/23/2024   ALBUMIN 3.1 (L) 06/23/2024   AST 24 06/23/2024   ALT 17 06/23/2024   ALKPHOS 149 (H) 06/23/2024   BILITOT 0.3 06/23/2024   GFRNONAA >60 06/23/2024   GFRAA >60 03/29/2020    Lab Results  Component Value Date   CEA1 28.60 (H) 06/12/2021   CEA 44.01 (H) 02/02/2023    Lab Results  Component Value Date   INR 1.2  05/26/2024   LABPROT 15.4 (H) 05/26/2024    Imaging:  No results found.  Medications: I have reviewed the patient's current medications.   Assessment/Plan: Rectal cancer Partially obstructing mass beginning at 10 cm from the anal verge on colonoscopy 01/23/2021, biopsy-adenocarcinoma, immunohistochemical stains at [facility]-CK20, CDX-2, and SATB2 positive, patchy strong staining for p16.  The rectal tumor has a distinct immunohistochemical profile suggesting a primary colorectal tumor as opposed to metastatic cervical cancer. CT abdomen/pelvis 01/18/2021-bilateral hydronephrosis, endometrial fluid collection, prominent stool in the colon MRI pelvis 01/23/2021-tumor at 7.6 cm from the anal verge, abnormal signal bridge in the cervix, mesorectum, and anterior aspect of the rectum-similar to MRI from 2020, MRI stage could not be defined secondary to posttreatment changes in the pelvis 03/18/2021-total abdominal hysterectomy, cystoscopy with insertion of ureteral stents, exploratory laparotomy, aborted low anterior resection, descending loop colostomy--right and left fallopian tubes and ovaries negative for malignancy; excision portion of cervix positive for adenocarcinoma; uterus with invasive moderately differentiated adenocarcinoma of the cervix, HPV associated, invading through full-thickness of the cervix/lower uterine segment and into parametrial tissue, margins of resection received disrupted, cannot be evaluated; remaining uterus with extensive endocervical adenocarcinoma in situ involving lower uterine segment and replacing endometrium.  This carcinoma was felt to be a separate primary from the rectal tumor. PET scan at [facility] 04/11/2021-intense FDG activity in region of known rectal mass.  Hypermetabolic  left external iliac and right inguinal lymph nodes.  Minimal FDG uptake and small right supraclavicular lymph node.  No uptake seen in the cervix. 04/23/2021 [facility]NA right inguinal lymph node-negative for  malignancy Evaluated by Dr. Markham 05/21/2021, 05/28/2021-appears to have 2 synchronous malignancies (cervical adenocarcinoma and rectal adenocarcinoma); further surgery which would entail pelvic exenteration not recommended by gynecologic oncology or colorectal surgery.  Full doses of radiotherapy not felt to be feasible.  Recommended 3 months of CAPOX and then repeat MRI of abdomen/pelvis, chest CT and colonoscopy. Cycle 1 Xeloda 06/13/2021 Cycle 1 FOLFOX 07/08/2021, oxaliplatin  dose reduced secondary to pre-existing neuropathy Cycle 2 FOLFOX 07/29/2021, Udenyca  Cycle 3 FOLFOX 08/12/2021, Udenyca  Cycle 4 FOLFOX 09/02/2021, Udenyca  Cycle 5 FOLFOX 09/17/2021, Udenyca  CTs at [facility] 10/01/2021-unchanged findings of rectal malignancy status posttreatment.  Marked thickening of the proximal duodenal wall.  Unchanged bilateral inguinal lymph nodes which are not enlarged but avid on prior PET.  Unchanged diffuse bladder wall thickening and perivesicular stranding which may represent radiation cystitis.  New mild right lower lobe bronchial wall thickening, mucus impaction and tree-in-bud pulmonary nodules. Cycle 6 FOLFOX 10/22/2021, Udenyca  CT 01/21/2022-stable soft tissue thickening at the lower rectum, slight increase in adjacent stranding no evidence of metastatic disease in the chest, abdomen, or pelvis, interval diverting loop colostomy CT pelvis and lower extremities 04/22/2023-subcutaneous fat stranding in the lower legs bilaterally on the right greater than left, enlarged right inguinal lymph nodes, total left hip arthroplasty with stable acetabular bone erosions, ill-defined soft tissue density in the region of the distal rectum with surrounding fat stranding unchanged     Bilateral hydronephrosis-secondary to urinary retention? Hematometra found on pelvic MRI 01/23/2021, evaluated by gynecologic oncology Cervical cancer November 1994, stage Ib, treated with external beam radiation and brachytherapy at  [facility] Diabetes Neuropathy Chronic pain secondary to #6 Recurrent urinary tract infections History of a CVA History of hyperthyroidism G3, P3 COVID-19 infection January 2021 Admission 08/25/2021 with a urinary tract infection-culture negative Anemia secondary to chronic disease, chemotherapy, and rectal bleeding-1 unit packed red blood cells 08/26/2021, 02/25/2024 Hospital admission 09/23/2021-symptomatic anemia 16.  Admission 10/08/2021 with altered mental status, nausea/vomiting, and left foot ulcers Urine culture 10/08/2021 positive for Pseudomonas aeruginosa EGD 10/12/2021-gastric and duodenal ulcers-not bleeding 17.  Admission 11/29/2021 through 12/02/2021 with COVID-pneumonia 18.  Right lower extremity cellulitis 04/22/2023-Bactrim , repeat course of Bactrim  05/07/2023 19.  Admission 09/10/2023 with severe symptomatic anemia, dark stool-Hemoccult positive 3 units packed red blood cells Upper endoscopy 09/11/2023: Duodenal ulcers with strictures likely source of GI bleeding, no active bleeding. 20.  Chronic hypomagnesemia-etiology unclear 21.  05/27/2024: Open reduction/internal fixation of left prosthetic femur fracture 22.  05/30/2024: Left common iliac artery stent 23.   Nonhealing left heel ulcer     Disposition: Pore has a history of locally advanced rectal cancer.  There is no clinical evidence for progression of cancer.  She has chronic leg edema, potentially related to pelvic radiation.  She has vascular disease.  She recently underwent a left iliac artery stent placement.  She is undergoing wound care for a nonhealing ulcer at the left heel.  She is recovering from recent left femur fracture surgery.  Ms. Justo has chronic hypomagnesemia.  The low magnesium  level could be related to GI loss or renal disease.  She has been referred to nephrology.  She declines IV magnesium  today.  We will ask the skilled nursing facility to administer IV iron via her Port-A-Cath.  I recommend the  skilled nursing facility monitor the left  leg and obtain a Doppler if edema and erythema persist.  Ms. Massi will return for an office and lab visit in 4 weeks.  Arley Hof, MD  06/23/2024  11:50 AM

## 2024-06-23 NOTE — Progress Notes (Signed)
 Port accessed with 20G kit. Blood obtained. Port de accessed after being saline locked.

## 2024-06-23 NOTE — Telephone Encounter (Signed)
 CRITICAL VALUE STICKER  CRITICAL VALUE:magnesium   0.7  RECEIVER (on-site recipient of call):Asiyah Pineau M  DATE & TIME NOTIFIED: 06/23/24   MESSENGER (representative from lab):AC  MD NOTIFIED: GBS  TIME OF NOTIFICATION:  RESPONSE:  4g IV magnesium 

## 2024-06-29 ENCOUNTER — Ambulatory Visit (HOSPITAL_COMMUNITY)
Admission: RE | Admit: 2024-06-29 | Discharge: 2024-06-29 | Disposition: A | Discharge status: Home / Self Care | Source: Ambulatory Visit | Attending: Vascular Surgery | Admitting: Vascular Surgery

## 2024-06-29 ENCOUNTER — Ambulatory Visit (HOSPITAL_BASED_OUTPATIENT_CLINIC_OR_DEPARTMENT_OTHER)
Admit: 2024-06-29 | Discharge: 2024-06-29 | Disposition: A | Discharge status: Home / Self Care | Attending: Vascular Surgery | Admitting: Vascular Surgery

## 2024-06-29 ENCOUNTER — Ambulatory Visit (INDEPENDENT_AMBULATORY_CARE_PROVIDER_SITE_OTHER): Admitting: Physician Assistant

## 2024-06-29 DIAGNOSIS — M86172 Other acute osteomyelitis, left ankle and foot: Secondary | ICD-10-CM | POA: Diagnosis present

## 2024-06-29 DIAGNOSIS — I739 Peripheral vascular disease, unspecified: Secondary | ICD-10-CM

## 2024-06-29 DIAGNOSIS — I70244 Atherosclerosis of native arteries of left leg with ulceration of heel and midfoot: Secondary | ICD-10-CM | POA: Insufficient documentation

## 2024-06-29 LAB — VAS US ABI WITH/WO TBI
Left ABI: 0.99
Right ABI: 1.03

## 2024-06-29 NOTE — Progress Notes (Signed)
 Office Note   History of Present Illness   [name] is a 78 y.o. ([birth year]) female who presents for post angio follow-up.  She recently underwent left lower extremity angiogram with left common iliac artery stenting on 05/30/2024 by Dr. Sheree.  This was done for critical limb ischemia with a left heel ulceration.  She returns today for follow-up.  Her daughter is present at today's visit as well.  Unfortunately over the past month the patient's left heel ulceration has gotten significantly worse.  The wound has gotten much larger and deeper than it previously was. Her nursing facility has attempted to debride the wound.  They have also taken a biopsy of some of her heel bone which is positive for osteomyelitis.  The patient states that her heel wound continues to cause her significant pain.  Current Outpatient Medications  Medication Sig Dispense Refill   acetaminophen  (TYLENOL ) 500 MG tablet Take 500 mg by mouth daily as needed for moderate pain (pain score 4-6) or headache.     atorvastatin  (LIPITOR) 40 MG tablet Take 40 mg by mouth at bedtime.     baclofen  (LIORESAL ) 10 MG tablet Take 1 tablet (10 mg total) by mouth 2 (two) times daily. 30 each 0   Cholecalciferol  (VITAMIN D3 SUPER STRENGTH) 50 MCG (2000 UT) CAPS Take 2,000 Units by mouth daily.     clopidogrel  (PLAVIX ) 75 MG tablet Take 1 tablet (75 mg total) by mouth daily with breakfast. 30 tablet 0   clotrimazole -betamethasone  (LOTRISONE ) cream APPLY CREAM TOPICALLY ONCE DAILY TO HEELS *NEW PRESCRIPTION REQUEST* (Patient taking differently: Apply 1 Application topically See admin instructions. APPLY CREAM TOPICALLY ONCE DAILY TO HEELS) 45 g 11   Continuous Glucose Receiver (FREESTYLE LIBRE 3 READER) DEVI as directed. (Patient not taking: Reported on 06/23/2024)     Continuous Glucose Sensor (FREESTYLE LIBRE 3 SENSOR) MISC Inject 1 Device into the skin every 14 (fourteen) days. (Patient not taking: Reported on 06/23/2024)      cyanocobalamin  (VITAMIN B12) 1000 MCG tablet Take 1,000 mcg by mouth daily.     enoxaparin  (LOVENOX ) 40 MG/0.4ML injection Inject 0.4 mLs (40 mg total) into the skin daily. 12 mL 0   ferrous sulfate  325 (65 FE) MG tablet Take 325 mg by mouth See admin instructions. Take 325 mg by mouth with food on Mon/Wed/Fri     fluconazole  (DIFLUCAN ) 100 MG tablet Take 100 mg by mouth daily. Take x 5 days     gabapentin  (NEURONTIN ) 300 MG capsule Take 300-600 mg by mouth See admin instructions. Take 600 mg by mouth in the morning and 300 mg at bedtime     levothyroxine  (SYNTHROID ) 150 MCG tablet Take 150 mcg by mouth daily before breakfast.     lidocaine -prilocaine  (EMLA ) cream APPLY 1/2 (ONE-HALF) TABLESPOON TO PORT SITE 1 TO 2 HOURS PRIOR TO STICK AND COVER WITH PRESS-AND-SEAL TO NUMB SITE. *NEW PRESCRIPTION REQUEST* (Patient not taking: Reported on 06/23/2024) 30 g 2   metFORMIN  (GLUCOPHAGE -XR) 750 MG 24 hr tablet Take 750 mg by mouth 2 (two) times daily with a meal.     Multiple Vitamin (MULTIVITAMIN) tablet Take 1 tablet by mouth daily with breakfast.     NON FORMULARY Take 1 Dose by mouth daily. Magic cup     NON FORMULARY Apply topically daily at 6 (six) AM. (Patient taking differently: Apply topically daily at 6 (six) AM. Collagenase  OIntment 250 unit/GM)     nystatin  (NYAMYC ) powder APPLY POWDER TOPICALLY TWICE DAILY TO  GROIN RASH 15 g 5   oxyCODONE  (OXYCONTIN ) 20 mg 12 hr tablet Take 1 tablet (20 mg total) by mouth every 12 (twelve) hours. 14 tablet 0   oxyCODONE  (ROXICODONE ) 5 MG immediate release tablet Take 0.5-1 tablets (2.5-5 mg total) by mouth every 4 (four) hours as needed for severe pain (pain score 7-10). 30 tablet 0   pantoprazole  (PROTONIX ) 40 MG tablet Take 1 tablet (40 mg total) by mouth daily. 90 tablet 0   sertraline  (ZOLOFT ) 100 MG tablet Take 150 mg by mouth at bedtime.     sodium hypochlorite (DAKIN'S 1/4 STRENGTH) 0.125 % SOLN Irrigate with 1 Application as directed daily at 6 (six) AM.  To left heel     No current facility-administered medications for this visit.   Facility-Administered Medications Ordered in Other Visits  Medication Dose Route Frequency Provider Last Rate Last Admin   sodium chloride  flush (NS) 0.9 % injection 10 mL  10 mL Intravenous PRN Cloretta Arley NOVAK, MD   10 mL at 03/30/24 1158    REVIEW OF SYSTEMS (negative unless checked):   Cardiac:  []  Chest pain or chest pressure? []  Shortness of breath upon activity? []  Shortness of breath when lying flat? []  Irregular heart rhythm?  Vascular:  []  Pain in calf, thigh, or hip brought on by walking? []  Pain in feet at night that wakes you up from your sleep? []  Blood clot in your veins? []  Leg swelling?  Pulmonary:  []  Oxygen at home? []  Productive cough? []  Wheezing?  Neurologic:  []  Sudden weakness in arms or legs? []  Sudden numbness in arms or legs? []  Sudden onset of difficult speaking or slurred speech? []  Temporary loss of vision in one eye? []  Problems with dizziness?  Gastrointestinal:  []  Blood in stool? []  Vomited blood?  Genitourinary:  []  Burning when urinating? []  Blood in urine?  Psychiatric:  []  Major depression  Hematologic:  []  Bleeding problems? []  Problems with blood clotting?  Dermatologic:  []  Rashes or ulcers?  Constitutional:  []  Fever or chills?  Ear/Nose/Throat:  []  Change in hearing? []  Nose bleeds? []  Sore throat?  Musculoskeletal:  []  Back pain? []  Joint pain? []  Muscle pain?   Physical Examination   General: Chronically ill-appearing female in wheelchair Gait: Not observed HENT: WNL, normocephalic Pulmonary: normal non-labored breathing , without rales, rhonchi,  wheezing Cardiac: Regular Abdomen: soft, NT, no masses Skin: without rashes Vascular Exam/Pulses: Intact left DP/PT/peroneal Doppler signals Extremities: Significantly worsened appearance of left heel ulceration with foul-smelling drainage Musculoskeletal: no muscle wasting  or atrophy  Neurologic: A&O X 3;  No focal weakness or paresthesias are detected Psychiatric:  The pt has Normal affect.    Non-Invasive Vascular imaging   ABI (06/29/2024) R:  ABI: 1.03 (0.85),  PT: tri DP: bi TBI:  0 L:  ABI: 0.99 (1.01),  PT: mono DP: mono TBI: 0.43  Aortoiliac Duplex (06/29/2024) Patent left common iliac artery stent without stenosis   Medical Decision Making   BREON DISS is a 78 y.o. female who presents for post angio follow up  The patient recently underwent left common iliac artery stenting for critical limb ischemia with a left heel ulceration Aortoiliac duplex demonstrates a patent left common iliac artery stent without stenosis.  Her ABIs in the left are essentially unchanged postintervention, however her TBI has improved from 0 to 0.43 On exam she has intact left DP/PT/peroneal Doppler signals.  Unfortunately she has a significantly worsened appearance of her left heel wound.  The wound care providers at her nursing facility have attempted debridement of this area.  Her heel wound over the past month has gotten significantly larger and deeper.  She also had a biopsy performed of her calcaneus which was positive for osteomyelitis. The patient continues to have significant pain in her left foot due to her wound.  She is currently maximally revascularized.  I have explained to the patient that she is very unlikely to heal this wound and her best next option would be a left below-knee amputation.  Dr. Sheree was present during this conversation.  The patient and her family will discuss possible left below-knee amputation in the next couple of weeks.  They will call our office once the patient has made her decision  Ahmed Holster PA-C [facility] Office: [phone]  Clinic MD: Sheree

## 2024-06-29 NOTE — H&P (View-Only) (Signed)
 Office Note   History of Present Illness   [name] is a 78 y.o. ([birth year]) female who presents for post angio follow-up.  She recently underwent left lower extremity angiogram with left common iliac artery stenting on 05/30/2024 by Dr. Sheree.  This was done for critical limb ischemia with a left heel ulceration.  She returns today for follow-up.  Her daughter is present at today's visit as well.  Unfortunately over the past month the patient's left heel ulceration has gotten significantly worse.  The wound has gotten much larger and deeper than it previously was. Her nursing facility has attempted to debride the wound.  They have also taken a biopsy of some of her heel bone which is positive for osteomyelitis.  The patient states that her heel wound continues to cause her significant pain.  Current Outpatient Medications  Medication Sig Dispense Refill   acetaminophen  (TYLENOL ) 500 MG tablet Take 500 mg by mouth daily as needed for moderate pain (pain score 4-6) or headache.     atorvastatin  (LIPITOR) 40 MG tablet Take 40 mg by mouth at bedtime.     baclofen  (LIORESAL ) 10 MG tablet Take 1 tablet (10 mg total) by mouth 2 (two) times daily. 30 each 0   Cholecalciferol  (VITAMIN D3 SUPER STRENGTH) 50 MCG (2000 UT) CAPS Take 2,000 Units by mouth daily.     clopidogrel  (PLAVIX ) 75 MG tablet Take 1 tablet (75 mg total) by mouth daily with breakfast. 30 tablet 0   clotrimazole -betamethasone  (LOTRISONE ) cream APPLY CREAM TOPICALLY ONCE DAILY TO HEELS *NEW PRESCRIPTION REQUEST* (Patient taking differently: Apply 1 Application topically See admin instructions. APPLY CREAM TOPICALLY ONCE DAILY TO HEELS) 45 g 11   Continuous Glucose Receiver (FREESTYLE LIBRE 3 READER) DEVI as directed. (Patient not taking: Reported on 06/23/2024)     Continuous Glucose Sensor (FREESTYLE LIBRE 3 SENSOR) MISC Inject 1 Device into the skin every 14 (fourteen) days. (Patient not taking: Reported on 06/23/2024)      cyanocobalamin  (VITAMIN B12) 1000 MCG tablet Take 1,000 mcg by mouth daily.     enoxaparin  (LOVENOX ) 40 MG/0.4ML injection Inject 0.4 mLs (40 mg total) into the skin daily. 12 mL 0   ferrous sulfate  325 (65 FE) MG tablet Take 325 mg by mouth See admin instructions. Take 325 mg by mouth with food on Mon/Wed/Fri     fluconazole  (DIFLUCAN ) 100 MG tablet Take 100 mg by mouth daily. Take x 5 days     gabapentin  (NEURONTIN ) 300 MG capsule Take 300-600 mg by mouth See admin instructions. Take 600 mg by mouth in the morning and 300 mg at bedtime     levothyroxine  (SYNTHROID ) 150 MCG tablet Take 150 mcg by mouth daily before breakfast.     lidocaine -prilocaine  (EMLA ) cream APPLY 1/2 (ONE-HALF) TABLESPOON TO PORT SITE 1 TO 2 HOURS PRIOR TO STICK AND COVER WITH PRESS-AND-SEAL TO NUMB SITE. *NEW PRESCRIPTION REQUEST* (Patient not taking: Reported on 06/23/2024) 30 g 2   metFORMIN  (GLUCOPHAGE -XR) 750 MG 24 hr tablet Take 750 mg by mouth 2 (two) times daily with a meal.     Multiple Vitamin (MULTIVITAMIN) tablet Take 1 tablet by mouth daily with breakfast.     NON FORMULARY Take 1 Dose by mouth daily. Magic cup     NON FORMULARY Apply topically daily at 6 (six) AM. (Patient taking differently: Apply topically daily at 6 (six) AM. Collagenase  OIntment 250 unit/GM)     nystatin  (NYAMYC ) powder APPLY POWDER TOPICALLY TWICE DAILY TO  GROIN RASH 15 g 5   oxyCODONE  (OXYCONTIN ) 20 mg 12 hr tablet Take 1 tablet (20 mg total) by mouth every 12 (twelve) hours. 14 tablet 0   oxyCODONE  (ROXICODONE ) 5 MG immediate release tablet Take 0.5-1 tablets (2.5-5 mg total) by mouth every 4 (four) hours as needed for severe pain (pain score 7-10). 30 tablet 0   pantoprazole  (PROTONIX ) 40 MG tablet Take 1 tablet (40 mg total) by mouth daily. 90 tablet 0   sertraline  (ZOLOFT ) 100 MG tablet Take 150 mg by mouth at bedtime.     sodium hypochlorite (DAKIN'S 1/4 STRENGTH) 0.125 % SOLN Irrigate with 1 Application as directed daily at 6 (six) AM.  To left heel     No current facility-administered medications for this visit.   Facility-Administered Medications Ordered in Other Visits  Medication Dose Route Frequency Provider Last Rate Last Admin   sodium chloride  flush (NS) 0.9 % injection 10 mL  10 mL Intravenous PRN Cloretta Arley NOVAK, MD   10 mL at 03/30/24 1158    REVIEW OF SYSTEMS (negative unless checked):   Cardiac:  []  Chest pain or chest pressure? []  Shortness of breath upon activity? []  Shortness of breath when lying flat? []  Irregular heart rhythm?  Vascular:  []  Pain in calf, thigh, or hip brought on by walking? []  Pain in feet at night that wakes you up from your sleep? []  Blood clot in your veins? []  Leg swelling?  Pulmonary:  []  Oxygen at home? []  Productive cough? []  Wheezing?  Neurologic:  []  Sudden weakness in arms or legs? []  Sudden numbness in arms or legs? []  Sudden onset of difficult speaking or slurred speech? []  Temporary loss of vision in one eye? []  Problems with dizziness?  Gastrointestinal:  []  Blood in stool? []  Vomited blood?  Genitourinary:  []  Burning when urinating? []  Blood in urine?  Psychiatric:  []  Major depression  Hematologic:  []  Bleeding problems? []  Problems with blood clotting?  Dermatologic:  []  Rashes or ulcers?  Constitutional:  []  Fever or chills?  Ear/Nose/Throat:  []  Change in hearing? []  Nose bleeds? []  Sore throat?  Musculoskeletal:  []  Back pain? []  Joint pain? []  Muscle pain?   Physical Examination   General: Chronically ill-appearing female in wheelchair Gait: Not observed HENT: WNL, normocephalic Pulmonary: normal non-labored breathing , without rales, rhonchi,  wheezing Cardiac: Regular Abdomen: soft, NT, no masses Skin: without rashes Vascular Exam/Pulses: Intact left DP/PT/peroneal Doppler signals Extremities: Significantly worsened appearance of left heel ulceration with foul-smelling drainage Musculoskeletal: no muscle wasting  or atrophy  Neurologic: A&O X 3;  No focal weakness or paresthesias are detected Psychiatric:  The pt has Normal affect.    Non-Invasive Vascular imaging   ABI (06/29/2024) R:  ABI: 1.03 (0.85),  PT: tri DP: bi TBI:  0 L:  ABI: 0.99 (1.01),  PT: mono DP: mono TBI: 0.43  Aortoiliac Duplex (06/29/2024) Patent left common iliac artery stent without stenosis   Medical Decision Making   [name] is a 78 y.o. female who presents for post angio follow up  The patient recently underwent left common iliac artery stenting for critical limb ischemia with a left heel ulceration Aortoiliac duplex demonstrates a patent left common iliac artery stent without stenosis.  Her ABIs in the left are essentially unchanged postintervention, however her TBI has improved from 0 to 0.43 On exam she has intact left DP/PT/peroneal Doppler signals.  Unfortunately she has a significantly worsened appearance of her left heel wound.  The wound care providers at her nursing facility have attempted debridement of this area.  Her heel wound over the past month has gotten significantly larger and deeper.  She also had a biopsy performed of her calcaneus which was positive for osteomyelitis. The patient continues to have significant pain in her left foot due to her wound.  She is currently maximally revascularized.  I have explained to the patient that she is very unlikely to heal this wound and her best next option would be a left below-knee amputation.  Dr. Sheree was present during this conversation.  The patient and her family will discuss possible left below-knee amputation in the next couple of weeks.  They will call our office once the patient has made her decision  Ahmed Holster PA-C [facility] Office: [phone]  Clinic MD: Sheree

## 2024-06-30 ENCOUNTER — Other Ambulatory Visit: Payer: Self-pay | Admitting: *Deleted

## 2024-06-30 DIAGNOSIS — I70244 Atherosclerosis of native arteries of left leg with ulceration of heel and midfoot: Secondary | ICD-10-CM

## 2024-07-01 ENCOUNTER — Telehealth: Payer: Self-pay

## 2024-07-01 ENCOUNTER — Inpatient Hospital Stay

## 2024-07-01 MED ORDER — SODIUM CHLORIDE 0.9 % IV SOLN: Freq: Once | INTRAVENOUS | Status: AC

## 2024-07-01 MED ORDER — MAGNESIUM SULFATE 4 GM/100ML IV SOLN
4.0000 g | Freq: Once | INTRAVENOUS | Status: AC
  Administered 2024-07-01: 4 g via INTRAVENOUS
  Filled 2024-07-01: qty 100

## 2024-07-01 NOTE — Patient Instructions (Signed)

## 2024-07-01 NOTE — Telephone Encounter (Signed)
 [employer] approval for 07/12/24 surgery with Dr. Sheree - approval # [id number]

## 2024-07-01 NOTE — Progress Notes (Signed)
 Pt's daughter accompanied her for infusion today. Arrived and departed in a wheelchair.

## 2024-07-04 ENCOUNTER — Telehealth: Payer: Self-pay

## 2024-07-04 NOTE — Telephone Encounter (Signed)
 Spoke to patient's daughter re: her question for the necessity of upcoming BKA.  Daughter reports that swelling and discoloration has improved but wound has not.  Patient's daughter, [name], stated that she spoke to her mother's wound care nurse and had questions answered about why the wound is not healing, etc.  This nurse answered all daughter's questions and encouraged daughter to ensure all questions/concerns are alleviated prior to surgery.

## 2024-07-11 ENCOUNTER — Other Ambulatory Visit: Payer: Self-pay

## 2024-07-11 ENCOUNTER — Encounter (HOSPITAL_COMMUNITY): Payer: Self-pay | Admitting: Vascular Surgery

## 2024-07-11 NOTE — Anesthesia Preprocedure Evaluation (Signed)
 Anesthesia Evaluation  Patient identified by MRN, date of birth, ID band Patient awake    Reviewed: Allergy & Precautions, NPO status , Patient's Chart, lab work & pertinent test results  Airway Mallampati: II  TM Distance: >3 FB Neck ROM: Full    Dental  (+) Dental Advisory Given   Pulmonary former smoker   breath sounds clear to auscultation       Cardiovascular hypertension, Pt. on medications + CAD, + Peripheral Vascular Disease and +CHF   Rhythm:Regular Rate:Normal     Neuro/Psych  Neuromuscular disease CVA    GI/Hepatic Neg liver ROS, PUD,,,  Endo/Other  diabetes, Type 2Hypothyroidism    Renal/GU Renal disease     Musculoskeletal  (+) Arthritis ,    Abdominal   Peds  Hematology  (+) Blood dyscrasia, anemia   Anesthesia Other Findings   Reproductive/Obstetrics                              Anesthesia Physical Anesthesia Plan  ASA: 3  Anesthesia Plan: General   Post-op Pain Management: Tylenol  PO (pre-op)* and Regional block*   Induction: Intravenous  PONV Risk Score and Plan: 3 and Dexamethasone , Ondansetron  and Treatment may vary due to age or medical condition  Airway Management Planned: Oral ETT  Additional Equipment:   Intra-op Plan:   Post-operative Plan: Extubation in OR  Informed Consent: I have reviewed the patients History and Physical, chart, labs and discussed the procedure including the risks, benefits and alternatives for the proposed anesthesia with the patient or authorized representative who has indicated his/her understanding and acceptance.     Dental advisory given  Plan Discussed with: CRNA  Anesthesia Plan Comments: (PAT note by Lynwood Hope, PA-C: 78 year old female with pertinent history including non-insulin -dependent DM2 (A1c 6.2), HLD, CVA, carotid stenosis s/p right CEA 2021, GERD on PPI, HTN, cervical and rectal cancer s/p colostomy, PAD,  diabetic foot and heel ulcer, neuropathy, hypothyroidism, hypomagnesemia, anemia.  Recent admission 7/10 through 06/08/2024 for mechanical fall resulting in left femur fracture.  Underwent ORIF on 05/27/2024.  Vascular surgery was also consulted during admission.  She underwent arteriogram and had stenting of left common iliac artery and initiated on Plavix .  Seen in follow-up by vascular surgery 06/29/2024 and noted to have worsening of left heel ulceration.  She was recommended undergo left BKA.  Last dose Plavix  07/08/2024.  CMP and CBC 06/23/2024 reviewed, chronic hypomagnesemia with magnesium  0.7, hypoalbuminemia with albumin 3.1, chronic anemia with hemoglobin 9.3, otherwise unremarkable.  EKG 05/26/2024: Normal sinus rhythm.  Rate 75. Nonspecific ST abnormality. No significant change since last tracing  TTE 10/01/2020: 1. Left ventricular ejection fraction, by estimation, is 60 to 65%. The  left ventricle has normal function. The left ventricle has no regional  wall motion abnormalities. Left ventricular diastolic parameters are  consistent with Grade I diastolic  dysfunction (impaired relaxation).  2. Right ventricular systolic function is normal. The right ventricular  size is normal.  3. The mitral valve is normal in structure. No evidence of mitral valve  regurgitation. No evidence of mitral stenosis.  4. The aortic valve is tricuspid. Aortic valve regurgitation is not  visualized. Mild aortic valve sclerosis is present, with no evidence of  aortic valve stenosis.  5. The inferior vena cava is normal in size with greater than 50%  respiratory variability, suggesting right atrial pressure of 3 mmHg.   Comparison(s): No significant change from prior study. Prior  images  reviewed side by side.   Nuclear stress test 03/23/2020:  Lexiscan: Electrically negative for ischemia  Normal perfusion No ischemia  LVEF 62%  Low risk study   )         Anesthesia Quick  Evaluation

## 2024-07-11 NOTE — Progress Notes (Signed)
 Anesthesia Chart Review: Same day workup  78 year old female with pertinent history including non-insulin -dependent DM2 (A1c 6.2), HLD, CVA, carotid stenosis s/p right CEA 2021, GERD on PPI, HTN, cervical and rectal cancer s/p colostomy, PAD, diabetic foot and heel ulcer, neuropathy, hypothyroidism, hypomagnesemia, anemia.  Recent admission 7/10 through 06/08/2024 for mechanical fall resulting in left femur fracture.  Underwent ORIF on 05/27/2024.  Vascular surgery was also consulted during admission.  She underwent arteriogram and had stenting of left common iliac artery and initiated on Plavix .  Seen in follow-up by vascular surgery 06/29/2024 and noted to have worsening of left heel ulceration.  She was recommended undergo left BKA.  Last dose Plavix  07/08/2024.  CMP and CBC 06/23/2024 reviewed, chronic hypomagnesemia with magnesium  0.7, hypoalbuminemia with albumin 3.1, chronic anemia with hemoglobin 9.3, otherwise unremarkable.  EKG 05/26/2024: Normal sinus rhythm.  Rate 75. Nonspecific ST abnormality. No significant change since last tracing  TTE 10/01/2020: 1. Left ventricular ejection fraction, by estimation, is 60 to 65%. The  left ventricle has normal function. The left ventricle has no regional  wall motion abnormalities. Left ventricular diastolic parameters are  consistent with Grade I diastolic  dysfunction (impaired relaxation).   2. Right ventricular systolic function is normal. The right ventricular  size is normal.   3. The mitral valve is normal in structure. No evidence of mitral valve  regurgitation. No evidence of mitral stenosis.   4. The aortic valve is tricuspid. Aortic valve regurgitation is not  visualized. Mild aortic valve sclerosis is present, with no evidence of  aortic valve stenosis.   5. The inferior vena cava is normal in size with greater than 50%  respiratory variability, suggesting right atrial pressure of 3 mmHg.   Comparison(s): No significant change  from prior study. Prior images  reviewed side by side.   Nuclear stress test 03/23/2020: [place]: Electrically negative for ischemia Normal perfusion No ischemia LVEF 62% Low risk study     Lynwood Geofm RIGGERS [facility] Short Stay Center/Anesthesiology Phone [phone] 07/11/2024 12:05 PM

## 2024-07-11 NOTE — Progress Notes (Signed)
 SDW INSTRUCTIONS given:   Your procedure is scheduled on July 12, 2024.             Report to [facility] Main Entrance A at 8:50 A.M., and check in at the Admitting office.             Call this number if you have problems the morning of surgery:             [phone]               Remember:             Do not eat or drink after midnight the night before your surgery                Take these medicines the morning of surgery with A SIP OF WATER  baclofen  (LIORESAL )  gabapentin  (NEURONTIN )  levothyroxine  (SYNTHROID )  oxyCODONE  (OXYCONTIN )    IF NEEDED: acetaminophen  (TYLENOL )  oxyCODONE  (ROXICODONE )  ALPRAZolam  (XANAX )    As of today, STOP taking any Aspirin  (unless otherwise instructed by your surgeon) Aleve, Naproxen, Ibuprofen, Motrin, Advil, Goody's, BC's, all herbal medications, fish oil, and all vitamins. WHAT DO I DO ABOUT MY DIABETES MEDICATION?     Do not take oral diabetes medicines metFORMIN  (GLUCOPHAGE -XR)  the morning of surgery.   The day of surgery, do not take other diabetes injectables, including Byetta (exenatide), Bydureon (exenatide ER), Victoza (liraglutide), or Trulicity (dulaglutide).   If your CBG is greater than 220 mg/dL, you may take  of your sliding scale (correction) dose of insulin .     HOW TO MANAGE YOUR DIABETES BEFORE AND AFTER SURGERY   Why is it important to control my blood sugar before and after surgery? Improving blood sugar levels before and after surgery helps healing and can limit problems. A way of improving blood sugar control is eating a healthy diet by:  Eating less sugar and carbohydrates  Increasing activity/exercise  Talking with your doctor about reaching your blood sugar goals High blood sugars (greater than 180 mg/dL) can raise your risk of infections and slow your recovery, so you will need to focus on controlling your diabetes during the weeks before surgery. Make sure that the doctor who takes care of your diabetes  knows about your planned surgery including the date and location.   How do I manage my blood sugar before surgery? Check your blood sugar at least 4 times a day, starting 2 days before surgery, to make sure that the level is not too high or low.   Check your blood sugar the morning of your surgery when you wake up and every 2 hours until you get to the Short Stay unit.   If your blood sugar is less than 70 mg/dL, you will need to treat for low blood sugar: Do not take insulin . Treat a low blood sugar (less than 70 mg/dL) with  cup of clear juice (cranberry or apple), 4 glucose tablets, OR glucose gel. Recheck blood sugar in 15 minutes after treatment (to make sure it is greater than 70 mg/dL). If your blood sugar is not greater than 70 mg/dL on recheck, call [phone] for further instructions. Report your blood sugar to the short stay nurse when you get to Short Stay.   If you are admitted to the hospital after surgery: Your blood sugar will be checked by the staff and you will probably be given insulin  after surgery (instead of oral diabetes medicines) to make sure you have  good blood sugar levels. The goal for blood sugar control after surgery is 80-180 mg/dL.                      Do not wear jewelry, make up, or nail polish            Do not wear lotions, powders, perfumes/colognes, or deodorant.            Do not shave 48 hours prior to surgery.  Men may shave face and neck.            Do not bring valuables to the hospital.            [facility] is not responsible for any belongings or valuables.   Do NOT Smoke (Tobacco/Vaping) 24 hours prior to your procedure If you use a CPAP at night, you may bring all equipment for your overnight stay.   Contacts, glasses, dentures or bridgework may not be worn into surgery.      For patients admitted to the hospital, discharge time will be determined by your treatment team.   Patients discharged the day of surgery will not be allowed to  drive home, and someone needs to stay with them for 24 hours.       Special instructions:   [place]- Preparing For Surgery   Before surgery, you can play an important role. Because skin is not sterile, your skin needs to be as free of germs as possible. You can reduce the number of germs on your skin by washing with CHG (chlorahexidine gluconate) Soap before surgery.  CHG is an antiseptic cleaner which kills germs and bonds with the skin to continue killing germs even after washing.     Oral Hygiene is also important to reduce your risk of infection.  Remember - BRUSH YOUR TEETH THE MORNING OF SURGERY WITH YOUR REGULAR TOOTHPASTE   Please do not use if you have an allergy to CHG or antibacterial soaps. If your skin becomes reddened/irritated stop using the CHG.  Do not shave (including legs and underarms) for at least 48 hours prior to first CHG shower. It is OK to shave your face.   Please follow these instructions carefully.              Shower the NIGHT BEFORE SURGERY and the MORNING OF SURGERY with DIAL Soap.    Pat yourself dry with a CLEAN TOWEL.   Wear CLEAN PAJAMAS to bed the night before surgery   Place CLEAN SHEETS on your bed the night of your first shower and DO NOT SLEEP WITH PETS.     Day of Surgery: Please shower morning of surgery  Wear Clean/Comfortable clothing the morning of surgery Do not apply any deodorants/lotions.   Remember to brush your teeth WITH YOUR REGULAR TOOTHPASTE.   Questions were answered. Patient verbalized understanding of instructions.

## 2024-07-11 NOTE — Progress Notes (Addendum)
 PCP - Waylan Almarie SAUNDERS, MD  Cardiologist -   PPM/ICD - denies Device Orders - n/a Rep Notified - n/a  Chest x-ray - 05-30-24 EKG - 05-26-24 Stress Test - denies ECHO - 10-01-20 Cardiac Cath - denies   Fasting Blood Sugar - per nurse at facility blood sugar 165 this am Checks Blood Sugar BID  Blood Thinner Instructions: Plavix  last dose 07-08-24 Aspirin  Instructions: denies  ERAS Protcol - NPO  COVID TEST- n/a  Anesthesia review: Yes, HX of Stroke, DM. Per nurse area to left side is healed  Patient verbally denies any shortness of breath, fever, cough and chest pain during phone call   -------------  SDW INSTRUCTIONS given:  Your procedure is scheduled on July 12, 2024.  Report to [facility] Main Entrance A at 8:50 A.M., and check in at the Admitting office.  Call this number if you have problems the morning of surgery:  [phone]   Remember:  Do not eat or drink after midnight the night before your surgery    Take these medicines the morning of surgery with A SIP OF WATER  baclofen  (LIORESAL )  gabapentin  (NEURONTIN )  levothyroxine  (SYNTHROID )  oxyCODONE  (OXYCONTIN )   IF NEEDED: acetaminophen  (TYLENOL )  oxyCODONE  (ROXICODONE )  ALPRAZolam  (XANAX )   As of today, STOP taking any Aspirin  (unless otherwise instructed by your surgeon) Aleve, Naproxen, Ibuprofen, Motrin, Advil, Goody's, BC's, all herbal medications, fish oil, and all vitamins. WHAT DO I DO ABOUT MY DIABETES MEDICATION?   Do not take oral diabetes medicines metFORMIN  (GLUCOPHAGE -XR)  the morning of surgery.  The day of surgery, do not take other diabetes injectables, including Byetta (exenatide), Bydureon (exenatide ER), Victoza (liraglutide), or Trulicity (dulaglutide).  If your CBG is greater than 220 mg/dL, you may take  of your sliding scale (correction) dose of insulin .   HOW TO MANAGE YOUR DIABETES BEFORE AND AFTER SURGERY  Why is it important to control my blood sugar before and  after surgery? Improving blood sugar levels before and after surgery helps healing and can limit problems. A way of improving blood sugar control is eating a healthy diet by:  Eating less sugar and carbohydrates  Increasing activity/exercise  Talking with your doctor about reaching your blood sugar goals High blood sugars (greater than 180 mg/dL) can raise your risk of infections and slow your recovery, so you will need to focus on controlling your diabetes during the weeks before surgery. Make sure that the doctor who takes care of your diabetes knows about your planned surgery including the date and location.  How do I manage my blood sugar before surgery? Check your blood sugar at least 4 times a day, starting 2 days before surgery, to make sure that the level is not too high or low.  Check your blood sugar the morning of your surgery when you wake up and every 2 hours until you get to the Short Stay unit.  If your blood sugar is less than 70 mg/dL, you will need to treat for low blood sugar: Do not take insulin . Treat a low blood sugar (less than 70 mg/dL) with  cup of clear juice (cranberry or apple), 4 glucose tablets, OR glucose gel. Recheck blood sugar in 15 minutes after treatment (to make sure it is greater than 70 mg/dL). If your blood sugar is not greater than 70 mg/dL on recheck, call [phone] for further instructions. Report your blood sugar to the short stay nurse when you get to Short Stay.  If you are  admitted to the hospital after surgery: Your blood sugar will be checked by the staff and you will probably be given insulin  after surgery (instead of oral diabetes medicines) to make sure you have good blood sugar levels. The goal for blood sugar control after surgery is 80-180 mg/dL.                      Do not wear jewelry, make up, or nail polish            Do not wear lotions, powders, perfumes/colognes, or deodorant.            Do not shave 48 hours prior to  surgery.  Men may shave face and neck.            Do not bring valuables to the hospital.            [facility] is not responsible for any belongings or valuables.  Do NOT Smoke (Tobacco/Vaping) 24 hours prior to your procedure If you use a CPAP at night, you may bring all equipment for your overnight stay.   Contacts, glasses, dentures or bridgework may not be worn into surgery.      For patients admitted to the hospital, discharge time will be determined by your treatment team.   Patients discharged the day of surgery will not be allowed to drive home, and someone needs to stay with them for 24 hours.    Special instructions:   [place]- Preparing For Surgery  Before surgery, you can play an important role. Because skin is not sterile, your skin needs to be as free of germs as possible. You can reduce the number of germs on your skin by washing with CHG (chlorahexidine gluconate) Soap before surgery.  CHG is an antiseptic cleaner which kills germs and bonds with the skin to continue killing germs even after washing.    Oral Hygiene is also important to reduce your risk of infection.  Remember - BRUSH YOUR TEETH THE MORNING OF SURGERY WITH YOUR REGULAR TOOTHPASTE  Please do not use if you have an allergy to CHG or antibacterial soaps. If your skin becomes reddened/irritated stop using the CHG.  Do not shave (including legs and underarms) for at least 48 hours prior to first CHG shower. It is OK to shave your face.  Please follow these instructions carefully.   Shower the NIGHT BEFORE SURGERY and the MORNING OF SURGERY with DIAL Soap.   Pat yourself dry with a CLEAN TOWEL.  Wear CLEAN PAJAMAS to bed the night before surgery  Place CLEAN SHEETS on your bed the night of your first shower and DO NOT SLEEP WITH PETS.   Day of Surgery: Please shower morning of surgery  Wear Clean/Comfortable clothing the morning of surgery Do not apply any deodorants/lotions.   Remember to  brush your teeth WITH YOUR REGULAR TOOTHPASTE.   Questions were answered. Patient verbalized understanding of instructions.

## 2024-07-12 ENCOUNTER — Inpatient Hospital Stay (HOSPITAL_COMMUNITY)
Admission: RE | Admit: 2024-07-12 | Discharge: 2024-07-27 | DRG: 240 | Disposition: A | Discharge status: Skilled Nursing Facility | Source: Skilled Nursing Facility | Attending: Family Medicine | Admitting: Family Medicine

## 2024-07-12 ENCOUNTER — Encounter (HOSPITAL_COMMUNITY)
Admission: RE | Disposition: A | Discharge status: Skilled Nursing Facility | Payer: Self-pay | Source: Skilled Nursing Facility | Attending: Family Medicine

## 2024-07-12 ENCOUNTER — Encounter (HOSPITAL_COMMUNITY): Payer: Self-pay | Admitting: Vascular Surgery

## 2024-07-12 ENCOUNTER — Inpatient Hospital Stay (HOSPITAL_COMMUNITY): Payer: Self-pay | Admitting: Physician Assistant

## 2024-07-12 DIAGNOSIS — E669 Obesity, unspecified: Secondary | ICD-10-CM | POA: Diagnosis present

## 2024-07-12 DIAGNOSIS — N390 Urinary tract infection, site not specified: Secondary | ICD-10-CM | POA: Diagnosis not present

## 2024-07-12 DIAGNOSIS — E1140 Type 2 diabetes mellitus with diabetic neuropathy, unspecified: Secondary | ICD-10-CM | POA: Diagnosis present

## 2024-07-12 DIAGNOSIS — I2510 Atherosclerotic heart disease of native coronary artery without angina pectoris: Secondary | ICD-10-CM | POA: Diagnosis not present

## 2024-07-12 DIAGNOSIS — Z833 Family history of diabetes mellitus: Secondary | ICD-10-CM

## 2024-07-12 DIAGNOSIS — L97429 Non-pressure chronic ulcer of left heel and midfoot with unspecified severity: Secondary | ICD-10-CM | POA: Diagnosis not present

## 2024-07-12 DIAGNOSIS — I70262 Atherosclerosis of native arteries of extremities with gangrene, left leg: Secondary | ICD-10-CM

## 2024-07-12 DIAGNOSIS — Z95820 Peripheral vascular angioplasty status with implants and grafts: Secondary | ICD-10-CM

## 2024-07-12 DIAGNOSIS — E785 Hyperlipidemia, unspecified: Secondary | ICD-10-CM | POA: Diagnosis present

## 2024-07-12 DIAGNOSIS — E8809 Other disorders of plasma-protein metabolism, not elsewhere classified: Secondary | ICD-10-CM | POA: Diagnosis present

## 2024-07-12 DIAGNOSIS — E11621 Type 2 diabetes mellitus with foot ulcer: Secondary | ICD-10-CM | POA: Diagnosis present

## 2024-07-12 DIAGNOSIS — Z794 Long term (current) use of insulin: Secondary | ICD-10-CM

## 2024-07-12 DIAGNOSIS — E890 Postprocedural hypothyroidism: Secondary | ICD-10-CM | POA: Diagnosis present

## 2024-07-12 DIAGNOSIS — E1169 Type 2 diabetes mellitus with other specified complication: Secondary | ICD-10-CM | POA: Diagnosis present

## 2024-07-12 DIAGNOSIS — E876 Hypokalemia: Secondary | ICD-10-CM | POA: Diagnosis not present

## 2024-07-12 DIAGNOSIS — I110 Hypertensive heart disease with heart failure: Secondary | ICD-10-CM | POA: Diagnosis present

## 2024-07-12 DIAGNOSIS — Z6830 Body mass index (BMI) 30.0-30.9, adult: Secondary | ICD-10-CM

## 2024-07-12 DIAGNOSIS — M869 Osteomyelitis, unspecified: Secondary | ICD-10-CM | POA: Diagnosis present

## 2024-07-12 DIAGNOSIS — I5032 Chronic diastolic (congestive) heart failure: Secondary | ICD-10-CM | POA: Diagnosis present

## 2024-07-12 DIAGNOSIS — Z933 Colostomy status: Secondary | ICD-10-CM

## 2024-07-12 DIAGNOSIS — E1152 Type 2 diabetes mellitus with diabetic peripheral angiopathy with gangrene: Principal | ICD-10-CM | POA: Diagnosis present

## 2024-07-12 DIAGNOSIS — F0393 Unspecified dementia, unspecified severity, with mood disturbance: Secondary | ICD-10-CM | POA: Diagnosis present

## 2024-07-12 DIAGNOSIS — Z9049 Acquired absence of other specified parts of digestive tract: Secondary | ICD-10-CM

## 2024-07-12 DIAGNOSIS — D638 Anemia in other chronic diseases classified elsewhere: Secondary | ICD-10-CM | POA: Diagnosis present

## 2024-07-12 DIAGNOSIS — Z8541 Personal history of malignant neoplasm of cervix uteri: Secondary | ICD-10-CM

## 2024-07-12 DIAGNOSIS — K219 Gastro-esophageal reflux disease without esophagitis: Secondary | ICD-10-CM | POA: Diagnosis present

## 2024-07-12 DIAGNOSIS — D62 Acute posthemorrhagic anemia: Secondary | ICD-10-CM | POA: Diagnosis not present

## 2024-07-12 DIAGNOSIS — I70244 Atherosclerosis of native arteries of left leg with ulceration of heel and midfoot: Secondary | ICD-10-CM

## 2024-07-12 DIAGNOSIS — Z85048 Personal history of other malignant neoplasm of rectum, rectosigmoid junction, and anus: Secondary | ICD-10-CM

## 2024-07-12 DIAGNOSIS — E1165 Type 2 diabetes mellitus with hyperglycemia: Secondary | ICD-10-CM | POA: Diagnosis present

## 2024-07-12 DIAGNOSIS — Z96642 Presence of left artificial hip joint: Secondary | ICD-10-CM | POA: Diagnosis present

## 2024-07-12 DIAGNOSIS — Z87891 Personal history of nicotine dependence: Secondary | ICD-10-CM

## 2024-07-12 DIAGNOSIS — Z751 Person awaiting admission to adequate facility elsewhere: Secondary | ICD-10-CM

## 2024-07-12 DIAGNOSIS — Z7902 Long term (current) use of antithrombotics/antiplatelets: Secondary | ICD-10-CM

## 2024-07-12 DIAGNOSIS — Z825 Family history of asthma and other chronic lower respiratory diseases: Secondary | ICD-10-CM

## 2024-07-12 DIAGNOSIS — Z79899 Other long term (current) drug therapy: Secondary | ICD-10-CM

## 2024-07-12 DIAGNOSIS — L89150 Pressure ulcer of sacral region, unstageable: Secondary | ICD-10-CM | POA: Diagnosis present

## 2024-07-12 DIAGNOSIS — W19XXXD Unspecified fall, subsequent encounter: Secondary | ICD-10-CM | POA: Diagnosis present

## 2024-07-12 DIAGNOSIS — Z79890 Hormone replacement therapy (postmenopausal): Secondary | ICD-10-CM

## 2024-07-12 DIAGNOSIS — Z7984 Long term (current) use of oral hypoglycemic drugs: Secondary | ICD-10-CM

## 2024-07-12 DIAGNOSIS — S7292XD Unspecified fracture of left femur, subsequent encounter for closed fracture with routine healing: Secondary | ICD-10-CM

## 2024-07-12 HISTORY — PX: AMPUTATION: SHX166

## 2024-07-12 LAB — COMPREHENSIVE METABOLIC PANEL WITH GFR
ALT: 21 U/L (ref 0–44)
AST: 25 U/L (ref 15–41)
Albumin: 2.6 g/dL — ABNORMAL LOW (ref 3.5–5.0)
Alkaline Phosphatase: 126 U/L (ref 38–126)
Anion gap: 13 (ref 5–15)
BUN: 21 mg/dL (ref 8–23)
CO2: 21 mmol/L — ABNORMAL LOW (ref 22–32)
Calcium: 9.3 mg/dL (ref 8.9–10.3)
Chloride: 104 mmol/L (ref 98–111)
Creatinine, Ser: 0.81 mg/dL (ref 0.44–1.00)
GFR, Estimated: >60 mL/min (ref >60)
Glucose, Bld: 128 mg/dL — ABNORMAL HIGH (ref 70–99)
Potassium: 4 mmol/L (ref 3.5–5.1)
Sodium: 138 mmol/L (ref 135–145)
Total Bilirubin: 0.5 mg/dL (ref 0.0–1.2)
Total Protein: 7 g/dL (ref 6.5–8.1)

## 2024-07-12 LAB — CBC
HCT: 39.4 % (ref 36.0–46.0)
HCT: 25.8 % — ABNORMAL LOW (ref 36.0–46.0)
Hemoglobin: 11.3 g/dL — ABNORMAL LOW (ref 12.0–15.0)
Hemoglobin: 7.6 g/dL — ABNORMAL LOW (ref 12.0–15.0)
MCH: 24.7 pg — ABNORMAL LOW (ref 26.0–34.0)
MCH: 24.5 pg — ABNORMAL LOW (ref 26.0–34.0)
MCHC: 28.7 g/dL — ABNORMAL LOW (ref 30.0–36.0)
MCHC: 29.5 g/dL — ABNORMAL LOW (ref 30.0–36.0)
MCV: 86 fL (ref 80.0–100.0)
MCV: 83.2 fL (ref 80.0–100.0)
Platelets: 235 K/uL (ref 150–400)
Platelets: 189 K/uL (ref 150–400)
RBC: 4.58 MIL/uL (ref 3.87–5.11)
RBC: 3.1 MIL/uL — ABNORMAL LOW (ref 3.87–5.11)
RDW: 17.4 % — ABNORMAL HIGH (ref 11.5–15.5)
RDW: 17.2 % — ABNORMAL HIGH (ref 11.5–15.5)
WBC: 9 K/uL (ref 4.0–10.5)
WBC: 7 K/uL (ref 4.0–10.5)
nRBC: 0 % (ref 0.0–0.2)
nRBC: 0 % (ref 0.0–0.2)

## 2024-07-12 LAB — APTT: aPTT: 39 s — ABNORMAL HIGH (ref 24–36)

## 2024-07-12 LAB — GLUCOSE, CAPILLARY
Glucose-Capillary: 111 mg/dL — ABNORMAL HIGH (ref 70–99)
Glucose-Capillary: 171 mg/dL — ABNORMAL HIGH (ref 70–99)
Glucose-Capillary: 177 mg/dL — ABNORMAL HIGH (ref 70–99)
Glucose-Capillary: 203 mg/dL — ABNORMAL HIGH (ref 70–99)

## 2024-07-12 LAB — PROTIME-INR
INR: 1.2 (ref 0.8–1.2)
Prothrombin Time: 16.2 s — ABNORMAL HIGH (ref 11.4–15.2)

## 2024-07-12 LAB — MAGNESIUM: Magnesium: 0.9 mg/dL — CL (ref 1.7–2.4)

## 2024-07-12 LAB — HEMOGLOBIN A1C
Hgb A1c MFr Bld: 6 % — ABNORMAL HIGH (ref 4.8–5.6)
Mean Plasma Glucose: 125.5 mg/dL

## 2024-07-12 LAB — LACTIC ACID, PLASMA: Lactic Acid, Venous: 0.7 mmol/L (ref 0.5–1.9)

## 2024-07-12 LAB — HEMOGLOBIN AND HEMATOCRIT, BLOOD
HCT: 28 % — ABNORMAL LOW (ref 36.0–46.0)
Hemoglobin: 8.2 g/dL — ABNORMAL LOW (ref 12.0–15.0)

## 2024-07-12 LAB — CREATININE, SERUM
Creatinine, Ser: 0.75 mg/dL (ref 0.44–1.00)
GFR, Estimated: >60 mL/min (ref >60)

## 2024-07-12 LAB — SURGICAL PCR SCREEN
MRSA, PCR: NEGATIVE
Staphylococcus aureus: NEGATIVE

## 2024-07-12 SURGERY — AMPUTATION BELOW KNEE
Anesthesia: General | Site: Leg Lower | Laterality: Left

## 2024-07-12 MED ORDER — INSULIN ASPART 100 UNIT/ML IJ SOLN
0.0000 [IU] | Freq: Three times a day (TID) | INTRAMUSCULAR | Status: DC
  Administered 2024-07-13: 5 [IU] via SUBCUTANEOUS
  Administered 2024-07-13: 2 [IU] via SUBCUTANEOUS
  Administered 2024-07-13: 8 [IU] via SUBCUTANEOUS
  Administered 2024-07-14: 2 [IU] via SUBCUTANEOUS
  Administered 2024-07-14: 8 [IU] via SUBCUTANEOUS
  Administered 2024-07-14: 3 [IU] via SUBCUTANEOUS
  Administered 2024-07-15: 2 [IU] via SUBCUTANEOUS
  Administered 2024-07-15: 3 [IU] via SUBCUTANEOUS
  Administered 2024-07-15: 5 [IU] via SUBCUTANEOUS
  Administered 2024-07-16: 2 [IU] via SUBCUTANEOUS
  Administered 2024-07-16: 3 [IU] via SUBCUTANEOUS
  Administered 2024-07-17: 2 [IU] via SUBCUTANEOUS
  Administered 2024-07-17 – 2024-07-18 (×2): 3 [IU] via SUBCUTANEOUS
  Administered 2024-07-20: 2 [IU] via SUBCUTANEOUS
  Administered 2024-07-21 – 2024-07-22 (×2): 3 [IU] via SUBCUTANEOUS
  Administered 2024-07-22 – 2024-07-23 (×3): 2 [IU] via SUBCUTANEOUS
  Administered 2024-07-24: 3 [IU] via SUBCUTANEOUS
  Administered 2024-07-24: 2 [IU] via SUBCUTANEOUS
  Administered 2024-07-25: 5 [IU] via SUBCUTANEOUS
  Administered 2024-07-25 – 2024-07-26 (×2): 3 [IU] via SUBCUTANEOUS
  Administered 2024-07-26: 8 [IU] via SUBCUTANEOUS
  Administered 2024-07-27: 3 [IU] via SUBCUTANEOUS

## 2024-07-12 MED ORDER — ACETAMINOPHEN 325 MG PO TABS
325.0000 mg | ORAL_TABLET | Freq: Four times a day (QID) | ORAL | Status: DC | PRN
  Administered 2024-07-13 – 2024-07-27 (×14): 650 mg via ORAL
  Filled 2024-07-12 (×15): qty 2

## 2024-07-12 MED ORDER — FENTANYL CITRATE (PF) 250 MCG/5ML IJ SOLN
INTRAMUSCULAR | Status: DC | PRN
  Administered 2024-07-12 (×3): 50 ug via INTRAVENOUS

## 2024-07-12 MED ORDER — HYDROMORPHONE HCL 1 MG/ML IJ SOLN
0.5000 mg | INTRAMUSCULAR | Status: DC | PRN
  Administered 2024-07-13 – 2024-07-20 (×15): 0.5 mg via INTRAVENOUS
  Filled 2024-07-12 (×15): qty 0.5

## 2024-07-12 MED ORDER — HEPARIN SOD (PORK) LOCK FLUSH 100 UNIT/ML IV SOLN
500.0000 [IU] | INTRAVENOUS | Status: AC | PRN
  Administered 2024-07-27: 500 [IU]

## 2024-07-12 MED ORDER — SUGAMMADEX SODIUM 200 MG/2ML IV SOLN
INTRAVENOUS | Status: DC | PRN
  Administered 2024-07-12: 100 mg via INTRAVENOUS

## 2024-07-12 MED ORDER — ACETAMINOPHEN 500 MG PO TABS
1000.0000 mg | ORAL_TABLET | Freq: Once | ORAL | Status: DC
  Filled 2024-07-12: qty 2

## 2024-07-12 MED ORDER — BUPIVACAINE LIPOSOME 1.3 % IJ SUSP
INTRAMUSCULAR | Status: DC | PRN
  Administered 2024-07-12: 10 mL

## 2024-07-12 MED ORDER — LEVOTHYROXINE SODIUM 150 MCG PO TABS
150.0000 ug | ORAL_TABLET | Freq: Every day | ORAL | Status: DC
  Administered 2024-07-13 – 2024-07-27 (×15): 150 ug via ORAL
  Filled 2024-07-12: qty 2
  Filled 2024-07-12 (×3): qty 1
  Filled 2024-07-12 (×2): qty 2
  Filled 2024-07-12: qty 1
  Filled 2024-07-12: qty 2
  Filled 2024-07-12: qty 1
  Filled 2024-07-12: qty 2
  Filled 2024-07-12 (×2): qty 1
  Filled 2024-07-12 (×2): qty 2
  Filled 2024-07-12: qty 1
  Filled 2024-07-12: qty 2
  Filled 2024-07-12: qty 1
  Filled 2024-07-12: qty 2
  Filled 2024-07-12 (×3): qty 1
  Filled 2024-07-12: qty 2
  Filled 2024-07-12: qty 1
  Filled 2024-07-12 (×2): qty 2
  Filled 2024-07-12: qty 1
  Filled 2024-07-12 (×2): qty 2
  Filled 2024-07-12: qty 1

## 2024-07-12 MED ORDER — MAGNESIUM SULFATE 4 GM/100ML IV SOLN
4.0000 g | Freq: Once | INTRAVENOUS | Status: AC
  Administered 2024-07-12: 4 g via INTRAVENOUS
  Filled 2024-07-12: qty 100

## 2024-07-12 MED ORDER — SODIUM CHLORIDE 0.9% FLUSH
10.0000 mL | INTRAVENOUS | Status: DC | PRN
  Administered 2024-07-27: 10 mL

## 2024-07-12 MED ORDER — PROPOFOL 10 MG/ML IV BOLUS
INTRAVENOUS | Status: AC
  Filled 2024-07-12: qty 20

## 2024-07-12 MED ORDER — ACETAMINOPHEN 500 MG PO TABS: 500.0000 mg | ORAL_TABLET | Freq: Every day | ORAL | Status: DC | PRN

## 2024-07-12 MED ORDER — SERTRALINE HCL 50 MG PO TABS
150.0000 mg | ORAL_TABLET | Freq: Every day | ORAL | Status: DC
  Administered 2024-07-12 – 2024-07-26 (×15): 150 mg via ORAL
  Filled 2024-07-12 (×15): qty 1

## 2024-07-12 MED ORDER — 0.9 % SODIUM CHLORIDE (POUR BTL) OPTIME
TOPICAL | Status: DC | PRN
  Administered 2024-07-12: 1000 mL

## 2024-07-12 MED ORDER — ALPRAZOLAM 0.25 MG PO TABS
0.2500 mg | ORAL_TABLET | Freq: Three times a day (TID) | ORAL | Status: DC | PRN
Start: 2024-07-12 — End: 2024-07-23
  Administered 2024-07-18 – 2024-07-23 (×9): 0.25 mg via ORAL
  Filled 2024-07-12 (×9): qty 1

## 2024-07-12 MED ORDER — SODIUM CHLORIDE 0.9 % IV SOLN
2.0000 g | Freq: Two times a day (BID) | INTRAVENOUS | Status: DC
  Administered 2024-07-12 – 2024-07-16 (×8): 2 g via INTRAVENOUS
  Filled 2024-07-12 (×9): qty 12.5

## 2024-07-12 MED ORDER — CHLORHEXIDINE GLUCONATE CLOTH 2 % EX PADS: 6.0000 | MEDICATED_PAD | Freq: Once | CUTANEOUS | Status: DC

## 2024-07-12 MED ORDER — SODIUM CHLORIDE 0.9 % IV SOLN: INTRAVENOUS | Status: DC

## 2024-07-12 MED ORDER — LIDOCAINE 2% (20 MG/ML) 5 ML SYRINGE
INTRAMUSCULAR | Status: AC
  Filled 2024-07-12: qty 5

## 2024-07-12 MED ORDER — PROPOFOL 10 MG/ML IV BOLUS
INTRAVENOUS | Status: DC | PRN
  Administered 2024-07-12: 100 mg via INTRAVENOUS
  Administered 2024-07-12: 150 ug/kg/min via INTRAVENOUS
  Administered 2024-07-12: 30 mg via INTRAVENOUS

## 2024-07-12 MED ORDER — LIDOCAINE 2% (20 MG/ML) 5 ML SYRINGE
INTRAMUSCULAR | Status: DC | PRN
  Administered 2024-07-12: 20 mg via INTRAVENOUS

## 2024-07-12 MED ORDER — AMISULPRIDE (ANTIEMETIC) 5 MG/2ML IV SOLN: 10.0000 mg | Freq: Once | INTRAVENOUS | Status: DC | PRN

## 2024-07-12 MED ORDER — CLOPIDOGREL BISULFATE 75 MG PO TABS: 75.0000 mg | ORAL_TABLET | Freq: Every day | ORAL | Status: DC

## 2024-07-12 MED ORDER — PANTOPRAZOLE SODIUM 40 MG IV SOLR
40.0000 mg | Freq: Two times a day (BID) | INTRAVENOUS | Status: DC
  Administered 2024-07-12 – 2024-07-16 (×9): 40 mg via INTRAVENOUS
  Filled 2024-07-12 (×9): qty 10

## 2024-07-12 MED ORDER — FENTANYL CITRATE (PF) 100 MCG/2ML IJ SOLN: 25.0000 ug | INTRAMUSCULAR | Status: DC | PRN

## 2024-07-12 MED ORDER — LACTATED RINGERS IV SOLN
INTRAVENOUS | Status: DC | PRN
Start: 2024-07-12 — End: 2024-07-12

## 2024-07-12 MED ORDER — PHENYLEPHRINE HCL-NACL 20-0.9 MG/250ML-% IV SOLN
INTRAVENOUS | Status: DC | PRN
  Administered 2024-07-12: 50 ug/min via INTRAVENOUS
  Administered 2024-07-12: 20 ug/min via INTRAVENOUS

## 2024-07-12 MED ORDER — VANCOMYCIN HCL 1500 MG/300ML IV SOLN
1500.0000 mg | Freq: Once | INTRAVENOUS | Status: AC
  Administered 2024-07-12: 1500 mg via INTRAVENOUS
  Filled 2024-07-12 (×2): qty 300

## 2024-07-12 MED ORDER — HEPARIN SODIUM (PORCINE) 5000 UNIT/ML IJ SOLN
5000.0000 [IU] | Freq: Three times a day (TID) | INTRAMUSCULAR | Status: DC
  Administered 2024-07-13 – 2024-07-27 (×44): 5000 [IU] via SUBCUTANEOUS
  Filled 2024-07-12 (×43): qty 1

## 2024-07-12 MED ORDER — INSULIN ASPART 100 UNIT/ML IJ SOLN
0.0000 [IU] | Freq: Every day | INTRAMUSCULAR | Status: DC
  Administered 2024-07-14: 2 [IU] via SUBCUTANEOUS
  Administered 2024-07-16: 5 [IU] via SUBCUTANEOUS

## 2024-07-12 MED ORDER — VANCOMYCIN HCL 750 MG/150ML IV SOLN
750.0000 mg | INTRAVENOUS | Status: DC
  Administered 2024-07-13 – 2024-07-15 (×3): 750 mg via INTRAVENOUS
  Filled 2024-07-12 (×4): qty 150

## 2024-07-12 MED ORDER — TRAMADOL HCL 50 MG PO TABS
50.0000 mg | ORAL_TABLET | Freq: Four times a day (QID) | ORAL | Status: DC | PRN
  Administered 2024-07-13 – 2024-07-20 (×14): 50 mg via ORAL
  Filled 2024-07-12 (×14): qty 1

## 2024-07-12 MED ORDER — DEXAMETHASONE SODIUM PHOSPHATE 10 MG/ML IJ SOLN
INTRAMUSCULAR | Status: DC | PRN
  Administered 2024-07-12: 4 mg via INTRAVENOUS

## 2024-07-12 MED ORDER — ONDANSETRON HCL 4 MG/2ML IJ SOLN
INTRAMUSCULAR | Status: DC | PRN
  Administered 2024-07-12: 4 mg via INTRAVENOUS

## 2024-07-12 MED ORDER — ATORVASTATIN CALCIUM 40 MG PO TABS
40.0000 mg | ORAL_TABLET | Freq: Every day | ORAL | Status: DC
  Administered 2024-07-12 – 2024-07-26 (×15): 40 mg via ORAL
  Filled 2024-07-12 (×15): qty 1

## 2024-07-12 MED ORDER — BUPIVACAINE-EPINEPHRINE (PF) 0.5% -1:200000 IJ SOLN
INTRAMUSCULAR | Status: DC | PRN
Start: 2024-07-12 — End: 2024-07-12
  Administered 2024-07-12: 15 mL via PERINEURAL

## 2024-07-12 MED ORDER — CHLORHEXIDINE GLUCONATE 0.12 % MT SOLN
OROMUCOSAL | Status: AC
  Filled 2024-07-12: qty 15

## 2024-07-12 MED ORDER — ROPIVACAINE HCL 5 MG/ML IJ SOLN
INTRAMUSCULAR | Status: DC | PRN
  Administered 2024-07-12: 20 mL via PERINEURAL

## 2024-07-12 MED ORDER — INSULIN ASPART 100 UNIT/ML IJ SOLN: 0.0000 [IU] | INTRAMUSCULAR | Status: DC | PRN

## 2024-07-12 MED ORDER — ROCURONIUM BROMIDE 10 MG/ML (PF) SYRINGE
PREFILLED_SYRINGE | INTRAVENOUS | Status: AC
  Filled 2024-07-12: qty 10

## 2024-07-12 MED ORDER — ROCURONIUM BROMIDE 10 MG/ML (PF) SYRINGE
PREFILLED_SYRINGE | INTRAVENOUS | Status: DC | PRN
  Administered 2024-07-12: 40 mg via INTRAVENOUS

## 2024-07-12 MED ORDER — FENTANYL CITRATE (PF) 250 MCG/5ML IJ SOLN
INTRAMUSCULAR | Status: AC
  Filled 2024-07-12: qty 5

## 2024-07-12 MED ORDER — SODIUM CHLORIDE 0.9% FLUSH
10.0000 mL | Freq: Two times a day (BID) | INTRAVENOUS | Status: DC
  Administered 2024-07-12 – 2024-07-27 (×29): 10 mL

## 2024-07-12 MED ORDER — CEFAZOLIN SODIUM-DEXTROSE 2-4 GM/100ML-% IV SOLN
2.0000 g | INTRAVENOUS | Status: AC
  Administered 2024-07-12: 2 g via INTRAVENOUS
  Filled 2024-07-12: qty 100

## 2024-07-12 SURGICAL SUPPLY — 34 items
BAG COUNTER SPONGE SURGICOUNT (BAG) ×1 IMPLANT
BLADE SAW THK.89X75X18XSGTL (BLADE) ×1 IMPLANT
BNDG COHESIVE 6X5 TAN ST LF (GAUZE/BANDAGES/DRESSINGS) ×1 IMPLANT
BNDG COMPR ESMARK 6X3 LF (GAUZE/BANDAGES/DRESSINGS) IMPLANT
BNDG ELASTIC 4INX 5YD STR LF (GAUZE/BANDAGES/DRESSINGS) IMPLANT
BNDG ELASTIC 6INX 5YD STR LF (GAUZE/BANDAGES/DRESSINGS) IMPLANT
BNDG GAUZE DERMACEA FLUFF 4 (GAUZE/BANDAGES/DRESSINGS) IMPLANT
CANISTER SUCTION 3000ML PPV (SUCTIONS) ×1 IMPLANT
CLIP TI MEDIUM 6 (CLIP) IMPLANT
COVER SURGICAL LIGHT HANDLE (MISCELLANEOUS) ×1 IMPLANT
CUFF TRNQT CYL 24X4X16.5-23 (TOURNIQUET CUFF) IMPLANT
DRAPE HALF SHEET 40X57 (DRAPES) ×1 IMPLANT
DRAPE SURG ORHT 6 SPLT 77X108 (DRAPES) ×2 IMPLANT
DRSG ADAPTIC 3X8 NADH LF (GAUZE/BANDAGES/DRESSINGS) IMPLANT
ELECTRODE REM PT RTRN 9FT ADLT (ELECTROSURGICAL) ×1 IMPLANT
GAUZE SPONGE 4X4 12PLY STRL (GAUZE/BANDAGES/DRESSINGS) ×1 IMPLANT
GLOVE BIO SURGEON STRL SZ7.5 (GLOVE) ×1 IMPLANT
GOWN STRL REUS W/ TWL LRG LVL3 (GOWN DISPOSABLE) ×2 IMPLANT
GOWN STRL REUS W/ TWL XL LVL3 (GOWN DISPOSABLE) ×1 IMPLANT
KIT BASIN OR (CUSTOM PROCEDURE TRAY) ×1 IMPLANT
KIT TURNOVER KIT B (KITS) ×1 IMPLANT
NS IRRIG 1000ML POUR BTL (IV SOLUTION) ×1 IMPLANT
PACK GENERAL/GYN (CUSTOM PROCEDURE TRAY) ×1 IMPLANT
PAD ARMBOARD POSITIONER FOAM (MISCELLANEOUS) ×2 IMPLANT
POWDER SURGICEL 3.0 GRAM (HEMOSTASIS) IMPLANT
SPONGE T-LAP 18X18 ~~LOC~~+RFID (SPONGE) IMPLANT
STAPLER SKIN PROX 35W (STAPLE) ×1 IMPLANT
STOCKINETTE IMPERVIOUS LG (DRAPES) ×1 IMPLANT
SUT SILK 0 TIES 10X30 (SUTURE) ×1 IMPLANT
SUT SILK 2-0 18XBRD TIE 12 (SUTURE) ×1 IMPLANT
SUT VIC AB 2-0 CT1 18 (SUTURE) ×2 IMPLANT
TOWEL GREEN STERILE (TOWEL DISPOSABLE) ×2 IMPLANT
UNDERPAD 30X36 HEAVY ABSORB (UNDERPADS AND DIAPERS) ×1 IMPLANT
WATER STERILE IRR 1000ML POUR (IV SOLUTION) ×1 IMPLANT

## 2024-07-12 NOTE — Interval H&P Note (Signed)
 History and Physical Interval Note:  07/12/2024 8:57 AM  [name]  has presented today for surgery, with the diagnosis of Atherosclerosis of artery of extremity with gangrene.  The various methods of treatment have been discussed with the patient and family. After consideration of risks, benefits and other options for treatment, the patient has consented to  Procedure(s): AMPUTATION BELOW KNEE (Left) as a surgical intervention.  The patient's history has been reviewed, patient examined, no change in status, stable for surgery.  I have reviewed the patient's chart and labs.  Questions were answered to the patient's satisfaction.     [name]

## 2024-07-12 NOTE — H&P (Signed)
 History and Physical    Patient: [name] FMW:[id number] DOB: 11/21/1945 DOA: 07/12/2024 DOS: the patient was seen and examined on 07/12/2024 . PCP: Waylan Almarie SAUNDERS, MD  Patient coming from:PACU Chief complaint: No chief complaint on file.  HPI:  [name] is a 78 y.o. female with past medical history  of   T2DM, HLD, HTN, cervical and rectal cancer s/p colostomy, diabetic foot ulcer, neuropathy, hypothyroidism, hypomagnesemia, anemia and arthritis,   PAD left lower extremity angiogram with left common iliac artery stenting on 05/30/2024 by Dr. Sheree for critical limb ischemia for left heel ulceration , seen in clinic at vascular for left heel ulcer and foul smelling drainage, and pt was advised for left BKA.Pt was scheduled for Necrotic left heel ulcer bka.  Since her recent admission and discharge in July for same pt's ulcer had not healed, rather gotten worse.  Pt seen in PACU and no HPI. Very somnolent and sedated ahs not recovered from anesthesia.   ED Course:  Vital signs in the ED were notable for the following:  Vitals:   07/12/24 1430 07/12/24 1500 07/12/24 1523 07/12/24 1549  BP: (!) 129/36 (!) 121/37  (!) 130/46  Pulse: (!) 50 (!) 49 (!) 56 (!) 58  Temp:   97.8 F (36.6 C)   Resp: 15 12 16 16   Height:      Weight:      SpO2: 99% 100% 100% 98%  TempSrc:      BMI (Calculated):      >>ED evaluation thus far shows: CMP today shows bicarb of 21 glucose of 122. CBC shows anemia with a hemoglobin of 11.3 RDW of 17.4 platelets of 235.  >>While in the ED patient received the following: Medications  acetaminophen  (TYLENOL ) tablet 1,000 mg (has no administration in time range)  fentaNYL  (SUBLIMAZE ) injection 25-50 mcg (has no administration in time range)  amisulpride  (BARHEMSYS ) injection 10 mg (has no administration in time range)  0.9 %  sodium chloride  infusion (has no administration in time range)  Chlorhexidine  Gluconate Cloth 2 % PADS 6 each (has no  administration in time range)  chlorhexidine  (PERIDEX ) 0.12 % solution (has no administration in time range)  ceFAZolin  (ANCEF ) IVPB 2g/100 mL premix (2 g Intravenous Given 07/12/24 1026)   Review of Systems  Unable to perform ROS: Other (post op and sedated.)   Past Medical History:  Diagnosis Date   Arthritis    Basal ganglia stroke [facility])    March 2022   Bilateral lower extremity edema    Cancer (HCC)    twice- colon cancer, unsure of the other type of cancer   Carotid artery occlusion    Flank pain    Full dentures    History of cervical cancer    11/ 1994  Stage IB  s/p  high dose radiation brachytherapy @ [facility] 01/ 1995--- per pt no recurrence   History of chronic bronchitis    History of hyperthyroidism    d 03/ 2011---due to grave's disease--- s/p  RAI treatment 04/ 2011   History of kidney stones    History of sepsis 05/03/2018   due to UTI with klebsiella/ pyelonephritis/ ureteral obstruction cause by stone   Hyperlipidemia    Hypothyroidism, postradioiodine therapy    endocrinologist--  dr kassie--  dx graves disease and s/p RAI i131 treatement 4/ 2011   Nauseated    Oral thrush 05/03/2018   Pneumonia    Type 2 diabetes mellitus treated with insulin  (HCC)  FOLLOWED BY PCP   Urgency of urination    Wears glasses    Past Surgical History:  Procedure Laterality Date   ABDOMINAL AORTOGRAM N/A 05/30/2024   Procedure: ABDOMINAL AORTOGRAM;  Surgeon: Sheree Penne Bruckner, MD;  Location: [facility] INVASIVE CV LAB;  Service: Cardiovascular;  Laterality: N/A;   APPENDECTOMY     BIOPSY  01/23/2021   Procedure: BIOPSY;  Surgeon: Shila Gustav GAILS, MD;  Location: [facility];  Service: Endoscopy;;   BIOPSY  10/12/2021   Procedure: BIOPSY;  Surgeon: Eda Iha, MD;  Location: [facility];  Service: Gastroenterology;;   BIOPSY  09/11/2023   Procedure: BIOPSY;  Surgeon: Charlanne Groom, MD;  Location: [facility];  Service: Gastroenterology;;   BIOPSY  10/13/2023    Procedure: BIOPSY;  Surgeon: Federico Rosario BROCKS, MD;  Location: [facility];  Service: Gastroenterology;;   CAROTID ENDARTERECTOMY Right 03/28/2020   CATARACT EXTRACTION W/ INTRAOCULAR LENS  IMPLANT, BILATERAL  2004  approx.   COLONOSCOPY     COLONOSCOPY WITH PROPOFOL  N/A 01/23/2021   Procedure: COLONOSCOPY WITH PROPOFOL ;  Surgeon: Shila Gustav GAILS, MD;  Location: [facility];  Service: Endoscopy;  Laterality: N/A;   CYSTOSCOPY WITH STENT PLACEMENT Right 05/04/2018   Procedure: CYSTOSCOPY WITH STENT PLACEMENT AND RETROGRADE PYELOGRAM;  Surgeon: Devere Bruckner Righter, MD;  Location: [facility];  Service: Urology;  Laterality: Right;   CYSTOSCOPY/URETEROSCOPY/HOLMIUM LASER/STENT PLACEMENT Right 05/19/2018   Procedure: CYSTOSCOPY, URETEROSCOPY/HOLMIUM LASER, STONE BASKETRY/ STENT EXCHANGE;  Surgeon: Devere Bruckner Righter, MD;  Location: [facility];  Service: Urology;  Laterality: Right;   ENDARTERECTOMY Right 03/28/2020   Procedure: RIGHT CAROTID ENDARTERECTOMY with PATCH ANGIOPLASTY;  Surgeon: Oris Krystal FALCON, MD;  Location: [facility] OR;  Service: Vascular;  Laterality: Right;   ENTEROSCOPY N/A 10/13/2023   Procedure: ENTEROSCOPY;  Surgeon: Federico Rosario BROCKS, MD;  Location: [facility];  Service: Gastroenterology;  Laterality: N/A;   ESOPHAGOGASTRODUODENOSCOPY (EGD) WITH PROPOFOL  N/A 10/12/2021   Procedure: ESOPHAGOGASTRODUODENOSCOPY (EGD) WITH PROPOFOL ;  Surgeon: Eda Iha, MD;  Location: [facility];  Service: Gastroenterology;  Laterality: N/A;   ESOPHAGOGASTRODUODENOSCOPY (EGD) WITH PROPOFOL  N/A 09/11/2023   Procedure: ESOPHAGOGASTRODUODENOSCOPY (EGD) WITH PROPOFOL ;  Surgeon: Charlanne Groom, MD;  Location: [facility];  Service: Gastroenterology;  Laterality: N/A;   EXCISION MASS LEFT CHEST WALL  09-20-2007   dr sebastian  [facility]   HOT HEMOSTASIS  10/13/2023   Procedure: HOT HEMOSTASIS (ARGON PLASMA COAGULATION/BICAP);  Surgeon: Federico Rosario BROCKS, MD;  Location: [facility];   Service: Gastroenterology;;   LOWER EXTREMITY ANGIOGRAPHY N/A 05/30/2024   Procedure: Lower Extremity Angiography;  Surgeon: Sheree Penne Bruckner, MD;  Location: [facility] INVASIVE CV LAB;  Service: Cardiovascular;  Laterality: N/A;   LOWER EXTREMITY INTERVENTION N/A 05/30/2024   Procedure: LOWER EXTREMITY INTERVENTION;  Surgeon: Sheree Penne Bruckner, MD;  Location: [facility] INVASIVE CV LAB;  Service: Cardiovascular;  Laterality: N/A;   ORIF FEMUR FRACTURE Left 05/27/2024   Procedure: OPEN REDUCTION INTERNAL FIXATION (ORIF) DISTAL FEMUR FRACTURE;  Surgeon: Kendal Franky SQUIBB, MD;  Location: [facility] OR;  Service: Orthopedics;  Laterality: Left;   Radioactive Iodine  Therapy     for thyroid    REVISION TOTAL HIP ARTHROPLASTY Left early 2000s   SUBMUCOSAL TATTOO INJECTION  01/23/2021   Procedure: SUBMUCOSAL TATTOO INJECTION;  Surgeon: Nandigam, Kavitha V, MD;  Location: [facility];  Service: Endoscopy;;   TANDEM RING INSERTION  1995   dr olean @ [facility]   EUA w/ tandem placement in ovid  (for direct high dose radiation brachytherapy , cervical cancer)  TOTAL HIP ARTHROPLASTY Left 1990s   TRANSTHORACIC ECHOCARDIOGRAM  08/07/2017   mild focal basal hypertrophy of the septum,  ef 60-65%, grade 1 diastolic dysfunction/  atrial septum with lipomatous hypertrophy/  trivial PR   UPPER GASTROINTESTINAL ENDOSCOPY      reports that she quit smoking about 42 years ago. Her smoking use included cigarettes. She started smoking about 47 years ago. She has been exposed to tobacco smoke. She has never used smokeless tobacco. She reports that she does not drink alcohol and does not use drugs. Allergies  Allergen Reactions   Codeine Hives and Nausea Only   Family History  Problem Relation Age of Onset   Emphysema Father    Diabetes Father    Emphysema Sister    Emphysema Brother    Cancer Mother        Skin Cancer   Diabetes Mother    Colon cancer Neg Hx    Esophageal cancer Neg Hx    Rectal cancer Neg Hx     Stomach cancer Neg Hx    Prior to Admission medications   Medication Sig Start Date End Date Taking? Authorizing Provider  acetaminophen  (TYLENOL ) 500 MG tablet Take 500 mg by mouth daily as needed for moderate pain (pain score 4-6) or headache.   Yes [provider]  ALPRAZolam  (XANAX ) 0.25 MG tablet Take 0.25 mg by mouth every 8 (eight) hours as needed for anxiety.   Yes [provider]  atorvastatin  (LIPITOR) 40 MG tablet Take 40 mg by mouth at bedtime. 12/15/19  Yes [provider]  baclofen  (LIORESAL ) 10 MG tablet Take 1 tablet (10 mg total) by mouth 2 (two) times daily. 10/19/23  Yes Dezii, Alexandra, DO  Cholecalciferol  (VITAMIN D3 SUPER STRENGTH) 50 MCG (2000 UT) CAPS Take 2,000 Units by mouth daily.   Yes [provider]  clopidogrel  (PLAVIX ) 75 MG tablet Take 75 mg by mouth daily.   Yes [provider]  collagenase  (SANTYL ) 250 UNIT/GM ointment Apply 1 Application topically daily. Apply to left heal   Yes [provider]  cyanocobalamin  (VITAMIN B12) 1000 MCG tablet Take 1,000 mcg by mouth daily.   Yes [provider]  doxycycline  (ADOXA) 100 MG tablet Take 100 mg by mouth 2 (two) times daily. Started on 07-08-24  For 7 days   Yes [provider]  feeding supplement (BOOST HIGH PROTEIN) LIQD Take 1 Container by mouth 2 (two) times daily between meals.   Yes [provider]  ferrous sulfate  325 (65 FE) MG tablet Take 325 mg by mouth every Monday, Wednesday, and Friday.   Yes [provider]  gabapentin  (NEURONTIN ) 300 MG capsule Take 300-600 mg by mouth See admin instructions. Take 600 mg by mouth in the morning and 300 mg at bedtime   Yes [provider]  levothyroxine  (SYNTHROID ) 150 MCG tablet Take 150 mcg by mouth daily before breakfast.   Yes [provider]  magnesium  oxide (MAG-OX) 400 (240 Mg) MG tablet Take 400 mg by mouth daily.   Yes [provider]  metFORMIN   (GLUCOPHAGE -XR) 750 MG 24 hr tablet Take 750 mg by mouth 2 (two) times daily with a meal.   Yes [provider]  Multiple Vitamin (MULTIVITAMIN) tablet Take 1 tablet by mouth daily with breakfast.   Yes [provider]  NON FORMULARY Take 30 mLs by mouth 2 (two) times daily. Prostate for wound healing   Yes [provider]  nystatin  (NYAMYC ) powder APPLY POWDER TOPICALLY  TWICE DAILY TO GROIN RASH 05/19/24  Yes Cloretta Arley NOVAK, MD  oxyCODONE  (OXYCONTIN ) 20 mg 12 hr tablet Take 1 tablet (20 mg total) by mouth every 12 (twelve) hours. 06/04/24  Yes Vernon Ranks, MD  oxyCODONE  (ROXICODONE ) 5 MG immediate release tablet Take 0.5-1 tablets (2.5-5 mg total) by mouth every 4 (four) hours as needed for severe pain (pain score 7-10). 05/28/24  Yes Renae Bernarda HERO, PA-C  OXYGEN Inhale 2 L into the lungs daily as needed (Sat >90%).   Yes [provider]  pantoprazole  (PROTONIX ) 40 MG tablet Take 1 tablet (40 mg total) by mouth daily. 05/18/24  Yes Cloretta Arley NOVAK, MD  PRESCRIPTION MEDICATION Apply 1 application  topically 3 (three) times daily. House stock barrier cream to buttocks   Yes [provider]  PRESCRIPTION MEDICATION Take 120 mLs by mouth in the morning, at noon, in the evening, and at bedtime. Offer fluids for hydration and UTI prevention   Yes [provider]  sertraline  (ZOLOFT ) 100 MG tablet Take 150 mg by mouth at bedtime.   Yes [provider]  sodium hypochlorite (DAKIN'S 1/4 STRENGTH) 0.125 % SOLN Irrigate with 1 Application as directed daily at 6 (six) AM. To left heel   Yes [provider]  clotrimazole -betamethasone  (LOTRISONE ) cream APPLY CREAM TOPICALLY ONCE DAILY TO HEELS *NEW PRESCRIPTION REQUEST* Patient not taking: Reported on 07/11/2024 05/18/24   Silva Juliene SAUNDERS, DPM  Continuous Glucose Receiver (FREESTYLE LIBRE 3 READER) DEVI as directed. Patient not taking: No sig reported 01/28/24   [provider]   Continuous Glucose Sensor (FREESTYLE LIBRE 3 SENSOR) MISC Inject 1 Device into the skin every 14 (fourteen) days. Patient not taking: No sig reported 01/24/24   [provider]  enoxaparin  (LOVENOX ) 100 MG/ML injection Inject 100 mg into the skin daily.    [provider]  lidocaine -prilocaine  (EMLA ) cream APPLY 1/2 (ONE-HALF) TABLESPOON TO PORT SITE 1 TO 2 HOURS PRIOR TO STICK AND COVER WITH PRESS-AND-SEAL TO NUMB SITE. *NEW PRESCRIPTION REQUEST* Patient not taking: No sig reported 05/18/24   Cloretta Arley NOVAK, MD                                                                                 Vitals:   07/12/24 1430 07/12/24 1500 07/12/24 1523 07/12/24 1549  BP: (!) 129/36 (!) 121/37  (!) 130/46  Pulse: (!) 50 (!) 49 (!) 56 (!) 58  Resp: 15 12 16 16   Temp:   97.8 F (36.6 C)   TempSrc:      SpO2: 99% 100% 100% 98%  Weight:      Height:       Physical Exam Vitals reviewed.  Constitutional:      General: She is not in acute distress.    Appearance: She is not ill-appearing.  HENT:     Head: Normocephalic.  Cardiovascular:     Rate and Rhythm: Normal rate and regular rhythm.     Heart sounds: Normal heart sounds.  Pulmonary:     Breath sounds: Normal breath sounds.  Abdominal:     General: There is no distension.     Palpations: Abdomen is soft.  Tenderness: There is no abdominal tenderness.  Neurological:     General: No focal deficit present.     Comments: Sedated / somnolent.      Labs on Admission: I have personally reviewed following labs and imaging studies CBC: Recent Labs  Lab 07/12/24 0919  WBC 9.0  HGB 11.3*  HCT 39.4  MCV 86.0  PLT 235   Basic Metabolic Panel: Recent Labs  Lab 07/12/24 0919  NA 138  K 4.0  CL 104  CO2 21*  GLUCOSE 128*  BUN 21  CREATININE 0.81  CALCIUM  9.3   GFR: Estimated Creatinine Clearance: 50.6 mL/min (by C-G formula based on SCr of 0.81 mg/dL). Liver Function Tests: Recent Labs  Lab 07/12/24 0919   AST 25  ALT 21  ALKPHOS 126  BILITOT 0.5  PROT 7.0  ALBUMIN 2.6*   No results for input(s): LIPASE, AMYLASE in the last 168 hours. No results for input(s): AMMONIA in the last 168 hours. Recent Labs    05/28/24 0300 05/29/24 1146 05/30/24 0330 05/31/24 0350 06/01/24 0916 06/02/24 0425 06/05/24 0500 06/08/24 0317 06/23/24 1105 07/12/24 0919  BUN 16 19 20  25* 18 19 21 23  24* 21  CREATININE 1.22* 0.94 0.85 1.09* 0.82 0.80 0.74 0.72 0.74 0.81    Estimated Creatinine Clearance: 50.6 mL/min (by C-G formula based on SCr of 0.81 mg/dL).   Recent Labs    05/28/24 0300 05/29/24 1146 05/30/24 0330 05/31/24 0350 06/01/24 0916 06/02/24 0425 06/05/24 0500 06/08/24 0317 06/23/24 1105 07/12/24 0919  BUN 16 19 20  25* 18 19 21 23  24* 21  CREATININE 1.22* 0.94 0.85 1.09* 0.82 0.80 0.74 0.72 0.74 0.81  CO2 25 23 27 26 26 28 27 27 22  21*   Cardiac Enzymes: No results for input(s): CKTOTAL, CKMB, CKMBINDEX, TROPONINI in the last 168 hours. BNP (last 3 results) No results for input(s): PROBNP in the last 8760 hours. HbA1C: No results for input(s): HGBA1C in the last 72 hours. CBG: Recent Labs  Lab 07/12/24 0859 07/12/24 1143 07/12/24 1641  GLUCAP 111* 171* 177*   Lipid Profile: No results for input(s): CHOL, HDL, LDLCALC, TRIG, CHOLHDL, LDLDIRECT in the last 72 hours. Thyroid  Function Tests: No results for input(s): TSH, T4TOTAL, FREET4, T3FREE, THYROIDAB in the last 72 hours. Anemia Panel: No results for input(s): VITAMINB12, FOLATE, FERRITIN, TIBC, IRON, RETICCTPCT in the last 72 hours. Urine analysis:    Component Value Date/Time   COLORURINE YELLOW 06/07/2024 1916   APPEARANCEUR HAZY (A) 06/07/2024 1916   LABSPEC 1.020 06/07/2024 1916   PHURINE 5.0 06/07/2024 1916   GLUCOSEU 50 (A) 06/07/2024 1916   HGBUR SMALL (A) 06/07/2024 1916   BILIRUBINUR NEGATIVE 06/07/2024 1916   KETONESUR NEGATIVE 06/07/2024 1916    PROTEINUR 100 (A) 06/07/2024 1916   UROBILINOGEN 0.2 05/27/2014 1203   NITRITE NEGATIVE 06/07/2024 1916   LEUKOCYTESUR MODERATE (A) 06/07/2024 1916   Radiological Exams on Admission: No results found. Data Reviewed: Relevant notes from primary care and specialist visits, past discharge summaries as available in EHR, including Care Everywhere . Prior diagnostic testing as pertinent to current admission diagnoses, Updated medications and problem lists for reconciliation .ED course, including vitals, labs, imaging, treatment and response to treatment,Triage notes, nursing and pharmacy notes and ED provider's notes.Notable results as noted in HPI.Discussed case with EDMD/ ED APP/ or Specialty MD on call and as needed.  Assessment & Plan  >>Left Heel Ulcer/ Gangrene: -PT s/p Left BKA.  -Abx for next 24-48 hours to prevent any infection.  -  post op management  per vascular MD.   >>Recent Mechanical fall / Left femur fracture - Pt had left leg pain after a mechanical fall at home in July 2025 pt s/p Sx repair.  - Fall precautions     >> Type 2 diabetes: - Q4H SSI with CBG monitoring while NPO, carb consistent diet.     >> Chronic hypomagnesemia: Will follow levels and replace.MAG LEVEL FOR TODAY IS ADDED ON AND PENDING.    >>Essential HTN: - IV hydralazine  PRN for SBP > 180   >> Cervical cancer / # Rectal cancer s/p colostomy: - Followed closely with oncology, patient has declined further chemotherapy or surgery for removal of the remaining tumor    >>Anemia/  IDA:    Latest Ref Rng & Units 07/12/2024    9:19 AM 06/23/2024   11:05 AM 06/08/2024    3:17 AM  CBC  WBC 4.0 - 10.5 K/uL 9.0  9.1  7.4   Hemoglobin 12.0 - 15.0 g/dL 88.6  9.3  8.9   Hematocrit 36.0 - 46.0 % 39.4  31.0  29.8   Platelets 150 - 400 K/uL 235  212  287     >>HLD: - Continue atorvastatin    >> Hypothyroidism: - Continue on Synthroid    >>GERD: - Aspiration precaution / Continue home Protonix    >>Mood  disorder: - Continue sertraline    DVT prophylaxis:  Heparin   Consults:  None   Advance Care Planning:    Code Status: Full Code   Family Communication:  None  Disposition Plan:  TBD Severity of Illness: The appropriate patient status for this patient is INPATIENT. Inpatient status is judged to be reasonable and necessary in order to provide the required intensity of service to ensure the patient's safety. The patient's presenting symptoms, physical exam findings, and initial radiographic and laboratory data in the context of their chronic comorbidities is felt to place them at high risk for further clinical deterioration. Furthermore, it is not anticipated that the patient will be medically stable for discharge from the hospital within 2 midnights of admission.   * I certify that at the point of admission it is my clinical judgment that the patient will require inpatient hospital care spanning beyond 2 midnights from the point of admission due to high intensity of service, high risk for further deterioration and high frequency of surveillance required.*  Unresulted Labs (From admission, onward)     Start     Ordered   07/13/24 0500  CBC  Daily,   R      07/12/24 1629   07/13/24 0500  Comprehensive metabolic panel with GFR  Tomorrow morning,   R        07/12/24 1903   07/13/24 0500  Magnesium   Tomorrow morning,   R        07/12/24 1903   07/13/24 0500  Phosphorus  Tomorrow morning,   R        07/12/24 1903   07/12/24 1903  Magnesium   Add-on,   AD        07/12/24 1902   07/12/24 1630  CBC  (heparin )  Once,   R       Comments: Baseline for heparin  therapy IF NOT ALREADY DRAWN.  Notify MD if PLT < 100 K.    07/12/24 1629   07/12/24 1630  Creatinine, serum  (heparin )  Once,   R       Comments: Baseline for heparin  therapy IF NOT ALREADY DRAWN.  07/12/24 1629   07/12/24 1235  Hemoglobin A1c  Add-on,   AD        07/12/24 1238   07/12/24 1235  Lactic acid, plasma  (Lactic Acid)   STAT Now then every 3 hours,   R (with STAT occurrences)      07/12/24 1238   07/12/24 0900  Urinalysis, Routine w reflex microscopic -Urine, Clean Catch  Once,   R       Question:  Specimen Source  Answer:  Urine, Clean Catch   07/12/24 0859   Signed and Held  Basic metabolic panel  Daily,   R      Signed and Held            Meds ordered this encounter  Medications   DISCONTD: acetaminophen  (TYLENOL ) tablet 1,000 mg   DISCONTD: fentaNYL  (SUBLIMAZE ) injection 25-50 mcg   DISCONTD: amisulpride  (BARHEMSYS ) injection 10 mg   DISCONTD: 0.9 %  sodium chloride  infusion   DISCONTD: Chlorhexidine  Gluconate Cloth 2 % PADS 6 each   Chlorhexidine  Gluconate Cloth 2 % PADS 6 each   ceFAZolin  (ANCEF ) IVPB 2g/100 mL premix    Antibiotic Indication::   Surgical Prophylaxis   chlorhexidine  (PERIDEX ) 0.12 % solution    Harvell, Carson L: cabinet override   DISCONTD: 0.9 % irrigation (POUR BTL)   DISCONTD: acetaminophen  (TYLENOL ) tablet 500 mg   ALPRAZolam  (XANAX ) tablet 0.25 mg   DISCONTD: clopidogrel  (PLAVIX ) tablet 75 mg   atorvastatin  (LIPITOR) tablet 40 mg   levothyroxine  (SYNTHROID ) tablet 150 mcg   sertraline  (ZOLOFT ) tablet 150 mg   pantoprazole  (PROTONIX ) injection 40 mg   heparin  injection 5,000 Units   traMADol  (ULTRAM ) tablet 50 mg   HYDROmorphone  (DILAUDID ) injection 0.5 mg   DISCONTD: acetaminophen  (TYLENOL ) tablet 500 mg   acetaminophen  (TYLENOL ) tablet 325-650 mg   ceFEPIme  (MAXIPIME ) 2 g in sodium chloride  0.9 % 100 mL IVPB    Antibiotic Indication::   Other Indication (list below)    Other Indication::   Cellulitis   vancomycin  (VANCOREADY) IVPB 1500 mg/300 mL    Indication::   Cellulitis   insulin  aspart (novoLOG ) injection 0-15 Units    Correction coverage::   Moderate (average weight, post-op)    CBG < 70::   implement hypoglycemia protocol    CBG 70 - 120::   0 units    CBG 121 - 150::   2 units    CBG 151 - 200::   3 units    CBG 201 - 250::   5 units    CBG 251  - 300::   8 units    CBG 301 - 350::   11 units    CBG 351 - 400::   15 units    CBG > 400:   call MD and obtain STAT lab verification   vancomycin  (VANCOREADY) IVPB 750 mg/150 mL    Indication::   Cellulitis   heparin  lock flush 100 unit/mL   sodium chloride  flush (NS) 0.9 % injection 10-40 mL   sodium chloride  flush (NS) 0.9 % injection 10-40 mL     Orders Placed This Encounter  Procedures   Surgical pcr screen   APTT   CBC   Comprehensive metabolic panel   Protime-INR   Urinalysis, Routine w reflex microscopic -Urine, Clean Catch   Glucose, capillary   Glucose, capillary   Hemoglobin A1c   Lactic acid, plasma   CBC   CBC   Creatinine, serum   Glucose, capillary   Magnesium   Comprehensive metabolic panel with GFR   Magnesium    Phosphorus   Diet Carb Modified Fluid consistency: Thin; Room service appropriate? Yes   Pre-admission testing diagnosis   Informed Consent Details: Physician/Practitioner Attestation; Transcribe to consent form and obtain patient signature   6 CHG cloth bath night before surgery   6 CHG cloth bath AM of surgery   Outside Vendor Brace   Vital signs   Notify physician (specify)   Refer to Sidebar Report Mobility Protocol for Adult Inpatient   Initiate Adult Central Line Maintenance and Catheter Protocol for patients with central line (CVC, PICC, Port, Hemodialysis, Trialysis)   Apply Cellulitis Care Plan   Initiate Oral Care Protocol   Initiate Carrier Fluid Protocol   RN may order General Admission PRN Orders utilizing General Admission PRN medications (through manage orders) for the following patient needs: allergy symptoms (Claritin ), cold sores (Carmex), cough (Robitussin DM), eye irritation (Liquifilm Tears), hemorrhoids (Tucks), indigestion (Maalox), minor skin irritation (Hydrocortisone Cream), muscle pain Lucienne Gay), nose irritation (saline nasal spray) and sore throat (Chloraseptic spray).   Bed rest   SCD's   Patient has an active  order for code status: continue current code status order   Elevate amputated extremity on pillow   POD#1 bed to chair transfer, begin education on stump wrapping/positioning   POD#2 through discharge advance activity to wheelchair level/ambulation   Apply Amputation Care Plan   Patient has an active order for admit to inpatient/place in observation   Mobility Protocol: limited/restricted mobility NWB to the left BKA. Use knee immobilizer when in bed   Swallow screen   Apply Diabetes Mellitus Care Plan   STAT CBG when hypoglycemia is suspected. If treated, recheck every 15 minutes after each treatment until CBG >/= 70 mg/dl   Refer to Hypoglycemia Protocol Sidebar Report for treatment of CBG < 70 mg/dl   No HS correction Insulin    Cardiac Monitoring Continuous x 24 hours Indications for use: Other; other indications for use: post op   Care order/instruction: Okay to place purwick   Refer to Sidebar Report - CHG Cloths Sidebar   Patient Education: - [facility] Daily CHG Bathing   Apply needleless connector (cap)   May draw labs from central line   For first catheter occlusion notify IV team ([facility], [facility]) or provider at all other sites.   May access port-a-cath if present   Full code   vancomycin  per pharmacy consult   ceFEPime  (MAXIPIME ) per pharmacy consult            Consult to wound, ostomy, continence   Contact precautions   OT eval and treat   PT eval and treat   Type and screen   Routine line care   Admit to Inpatient (patient's expected length of stay will be greater than 2 midnights or inpatient only procedure)   Aspiration precautions   Fall precautions    Author: Mario LULLA Blanch, MD 12 pm- 8 pm. [facility]. 07/12/2024 7:12 PM Please note for any communication after hours contact [facility] Assigned provider on call on Amion.

## 2024-07-12 NOTE — Transfer of Care (Signed)
 Immediate Anesthesia Transfer of Care Note  Patient: [name]  Procedure(s) Performed: LEFT BELOW KNEE AMPUTATION (Left: Leg Lower)  Patient Location: PACU  Anesthesia Type:GA combined with regional for post-op pain  Level of Consciousness: drowsy  Airway & Oxygen Therapy: Patient Spontanous Breathing and Patient connected to nasal cannula oxygen  Post-op Assessment: Report given to RN and Post -op Vital signs reviewed and stable  Post vital signs: Reviewed and stable  Last Vitals:  Vitals Value Taken Time  BP 154/48 07/12/24 11:37  Temp 98.1   Pulse 69 07/12/24 11:39  Resp 17 07/12/24 11:39  SpO2 100 % 07/12/24 11:39  Vitals shown include unfiled device data.  Last Pain:  Vitals:   07/12/24 0900  TempSrc: Oral         Complications: No notable events documented.

## 2024-07-12 NOTE — Op Note (Signed)
    Patient name: [name] MRN: [id number] DOB: 08-Aug-1946 Sex: female  07/12/2024 Pre-operative Diagnosis: Necrotic left heel ulcer Post-operative diagnosis:  Same Surgeon:  Penne C. Sheree, MD Assistant: Ahmed Holster, PA Procedure Performed:  Left below-knee amputation  Indications: 78 year old female with history of left common iliac artery stenting for left heel ulceration which has worsened.  This all began at the time of left femur fracture which has been repaired.  We have discussed proceeding with left below-knee amputation her family agrees and she demonstrates good understanding and consent was signed.  An assistant was necessary to facilitate creation of the below-knee incision with transection of bone, resection of muscle and reapproximation of the anterior and posterior flaps  Findings: There was significant edema below the knee.  The tissue was viable with adequate bleeding however with scant edema it was difficult to reapproximate the fascia and the fascia was soft as well as the bone was noted to be soft as well.  Procedure:  The patient was identified in the holding area and taken to the operating room where she was placed upon operative table and general anesthesia was induced.  She was sterilely prepped and draped in the left lower extremity usual fashion, antibiotics were administered and a timeout was called.  A standard below-knee amputation 2/3-1/3 was planned.  Tourniquet was then placed above the knee the leg was exsanguinated and tourniquet was inflated.  The incision was then traced with 10 blade down to level of the bone.  Cautery was used to divide the muscles.  The anterior tibial vessels were clamped and divided and oversewn proximally.  The fibula and tibia were exposed.  Tibia was transected with bone saw and fibula with bone cutter.  Posterior flap was created with amputation knife.  All vessels were clamped with the tourniquet was allowed down.  The vessels  were oversewn with 2-0 Vicryl suture.  The soleal nerve and tibial nerves were pulled on tension and tied off and divided.  The bones were smoothed with a rasp.  The wound was thoroughly irrigated and hemostasis was obtained.  Flaps were reapproximated with interrupted 2-0 Vicryl suture and the skin was closed with staples and a sterile dressing was applied with shrinker sock.  Patient was awakened from anesthesia having tolerated the procedure without any complication.   EBL: 250 cc  Elan Brainerd C. Sheree, MD [facility] Office: [phone] Pager: [phone]

## 2024-07-12 NOTE — Anesthesia Procedure Notes (Signed)
 Procedure Name: Intubation Date/Time: 07/12/2024 10:20 AM  Performed by: Atanacio Arland HERO, CRNAPre-anesthesia Checklist: Patient identified, Emergency Drugs available, Suction available and Patient being monitored Patient Re-evaluated:Patient Re-evaluated prior to induction Oxygen Delivery Method: Circle System Utilized Preoxygenation: Pre-oxygenation with 100% oxygen Induction Type: IV induction Ventilation: Mask ventilation without difficulty Laryngoscope Size: Mac and 4 Grade View: Grade I Tube type: Oral Tube size: 7.0 mm Number of attempts: 1 Airway Equipment and Method: Stylet Placement Confirmation: ETT inserted through vocal cords under direct vision, positive ETCO2 and breath sounds checked- equal and bilateral Secured at: 21 cm Tube secured with: Tape Dental Injury: Teeth and Oropharynx as per pre-operative assessment

## 2024-07-12 NOTE — Anesthesia Postprocedure Evaluation (Signed)
 Anesthesia Post Note  Patient: [name]  Procedure(s) Performed: LEFT BELOW KNEE AMPUTATION (Left: Leg Lower)     Patient location during evaluation: PACU Anesthesia Type: General Level of consciousness: awake and alert Pain management: pain level controlled Vital Signs Assessment: post-procedure vital signs reviewed and stable Respiratory status: spontaneous breathing, nonlabored ventilation, respiratory function stable and patient connected to nasal cannula oxygen Cardiovascular status: blood pressure returned to baseline and stable Postop Assessment: no apparent nausea or vomiting Anesthetic complications: no   No notable events documented.  Last Vitals:  Vitals:   07/12/24 1523 07/12/24 1549  BP:  (!) 130/46  Pulse: (!) 56 (!) 58  Resp: 16 16  Temp: 36.6 C   SpO2: 100% 98%    Last Pain:  Vitals:   07/12/24 1611  TempSrc:   PainSc: Asleep                 Epifanio Lamar BRAVO

## 2024-07-12 NOTE — Anesthesia Procedure Notes (Signed)
 Anesthesia Regional Block: Adductor canal block   Pre-Anesthetic Checklist: , timeout performed,  Correct Patient, Correct Site, Correct Laterality,  Correct Procedure, Correct Position, site marked,  Risks and benefits discussed,  Surgical consent,  Pre-op evaluation,  At surgeon's request and post-op pain management  Laterality: Left  Prep: chloraprep       Needles:  Injection technique: Single-shot  Needle Type: Echogenic Needle     Needle Length: 9cm  Needle Gauge: 21     Additional Needles:   Procedures:,,,, ultrasound used (permanent image in chart),,    Narrative:  Start time: 07/12/2024 9:52 AM End time: 07/12/2024 9:57 AM Injection made incrementally with aspirations every 5 mL.  Performed by: Personally  Anesthesiologist: Epifanio Charleston, MD

## 2024-07-12 NOTE — Anesthesia Procedure Notes (Signed)
 Anesthesia Regional Block: Popliteal block   Pre-Anesthetic Checklist: , timeout performed,  Correct Patient, Correct Site, Correct Laterality,  Correct Procedure, Correct Position, site marked,  Risks and benefits discussed,  Surgical consent,  Pre-op evaluation,  At surgeon's request and post-op pain management  Laterality: Left  Prep: chloraprep       Needles:  Injection technique: Single-shot  Needle Type: Echogenic Needle     Needle Length: 9cm  Needle Gauge: 21     Additional Needles:   Procedures:,,,, ultrasound used (permanent image in chart),,    Narrative:  Start time: 07/12/2024 9:45 AM End time: 07/12/2024 9:52 AM Injection made incrementally with aspirations every 5 mL.  Performed by: Personally  Anesthesiologist: Epifanio Charleston, MD

## 2024-07-12 NOTE — Progress Notes (Addendum)
       Overnight   NAME: CATHELINE HIXON MRN: [id number] DOB : 03-10-1946    Date of Service   07/12/2024   HPI/Events of Note    Notified by Attending to follow up on redraws of Hgb.   Interventions/ Plan   Awaiting Hgb redraw result- pending Awaiting Magnesium  result- pending      ----------------------------------------------------------------- Update 2045 hrs     Latest Reference Range & Units 07/12/24 18:28  Magnesium  1.7 - 2.4 mg/dL 0.9 (LL)  (LL): Data is critically low    Latest Reference Range & Units 07/12/24 18:28 07/12/24 19:52  WBC 4.0 - 10.5 K/uL 7.0   RBC 3.87 - 5.11 MIL/uL 3.10 (L)   Hemoglobin 12.0 - 15.0 g/dL 7.6 (L) 8.2 (L)  HCT 63.9 - 46.0 % 25.8 (L) 28.0 (L)  (L): Data is abnormally low  Recheck of Hgb yields level consistent with patient average overall past several admissions, prior draw at 7.6 may be anomalous.  -Re-timed next Hgb to 0300 hrs ( 6 hours)   -Will recheck if any witnessed bleeding or BP drop.  -Magnesium  replacement is in process  -AM Labs re-timed to 6 hours to allow for Magnesium  correction and correction after if needed.    Lynwood Kipper BSN MSNA MSN ACNPC-AG Acute Care Nurse Practitioner [facility]

## 2024-07-12 NOTE — Progress Notes (Signed)
 Pharmacy Antibiotic Note  [name] is a 78 y.o. female admitted on 07/12/2024 with cellulitis.  Pharmacy has been consulted for Vancomycin  and Cefepime  dosing.  Plan: Vancomycin  1500 mg IV then 750 mg IV every 24 hours.  Goal trough 10-15 mcg/mL. Calculated AUC 543 Cefepime  2 g Q12H  Height: 5' (152.4 cm) Weight: 71.7 kg (158 lb 1.1 oz) IBW/kg (Calculated) : 45.5  Temp (24hrs), Avg:98.1 F (36.7 C), Min:98 F (36.7 C), Max:98.3 F (36.8 C)  Recent Labs  Lab 07/12/24 0919  WBC 9.0  CREATININE 0.81    Estimated Creatinine Clearance: 50.6 mL/min (by C-G formula based on SCr of 0.81 mg/dL).    Allergies  Allergen Reactions   Codeine Hives and Nausea Only    Antimicrobials this admission: Cefazolin  8/26 Vancomycin  8/26 >>  Cefepime  8/26 >>  Dose adjustments this admission:   Microbiology results: 8/26 MRSA PCR: negative  Thank you for allowing pharmacy to be a part of this patient's care.  [name] 07/12/2024 3:33 PM

## 2024-07-12 NOTE — Progress Notes (Signed)
 1978 Lab called with critical HgB 7.6, paged oncall triad, Blondie, notified dayshift nurse [address].

## 2024-07-12 NOTE — Progress Notes (Signed)
 Orthopedic Tech Progress Note Patient Details:  [name] 30-Jan-1946 [id number]  PACU RN stated patient has shrinker stump in bed  Patient ID: [name], female   DOB: 1946/08/10, 78 y.o.   MRN: [id number]  [name] Pac 07/12/2024, 1:11 PM

## 2024-07-13 ENCOUNTER — Encounter (HOSPITAL_COMMUNITY): Payer: Self-pay | Admitting: Vascular Surgery

## 2024-07-13 DIAGNOSIS — Z89512 Acquired absence of left leg below knee: Secondary | ICD-10-CM

## 2024-07-13 DIAGNOSIS — L97429 Non-pressure chronic ulcer of left heel and midfoot with unspecified severity: Secondary | ICD-10-CM | POA: Diagnosis not present

## 2024-07-13 LAB — COMPREHENSIVE METABOLIC PANEL WITH GFR
ALT: 15 U/L (ref 0–44)
AST: 21 U/L (ref 15–41)
Albumin: 2 g/dL — ABNORMAL LOW (ref 3.5–5.0)
Alkaline Phosphatase: 96 U/L (ref 38–126)
Anion gap: 10 (ref 5–15)
BUN: 21 mg/dL (ref 8–23)
CO2: 23 mmol/L (ref 22–32)
Calcium: 8.1 mg/dL — ABNORMAL LOW (ref 8.9–10.3)
Chloride: 105 mmol/L (ref 98–111)
Creatinine, Ser: 0.77 mg/dL (ref 0.44–1.00)
GFR, Estimated: >60 mL/min (ref >60)
Glucose, Bld: 168 mg/dL — ABNORMAL HIGH (ref 70–99)
Potassium: 3.8 mmol/L (ref 3.5–5.1)
Sodium: 138 mmol/L (ref 135–145)
Total Bilirubin: 0.6 mg/dL (ref 0.0–1.2)
Total Protein: 5.3 g/dL — ABNORMAL LOW (ref 6.5–8.1)

## 2024-07-13 LAB — GLUCOSE, CAPILLARY
Glucose-Capillary: 133 mg/dL — ABNORMAL HIGH (ref 70–99)
Glucose-Capillary: 275 mg/dL — ABNORMAL HIGH (ref 70–99)
Glucose-Capillary: 225 mg/dL — ABNORMAL HIGH (ref 70–99)
Glucose-Capillary: 148 mg/dL — ABNORMAL HIGH (ref 70–99)

## 2024-07-13 LAB — CBC
HCT: 25.7 % — ABNORMAL LOW (ref 36.0–46.0)
Hemoglobin: 7.5 g/dL — ABNORMAL LOW (ref 12.0–15.0)
MCH: 24.6 pg — ABNORMAL LOW (ref 26.0–34.0)
MCHC: 29.2 g/dL — ABNORMAL LOW (ref 30.0–36.0)
MCV: 84.3 fL (ref 80.0–100.0)
Platelets: 218 K/uL (ref 150–400)
RBC: 3.05 MIL/uL — ABNORMAL LOW (ref 3.87–5.11)
RDW: 17 % — ABNORMAL HIGH (ref 11.5–15.5)
WBC: 10.3 K/uL (ref 4.0–10.5)
nRBC: 0 % (ref 0.0–0.2)

## 2024-07-13 LAB — MAGNESIUM: Magnesium: 2.1 mg/dL (ref 1.7–2.4)

## 2024-07-13 LAB — PHOSPHORUS: Phosphorus: 3.7 mg/dL (ref 2.5–4.6)

## 2024-07-13 MED ORDER — GLUCAGON HCL RDNA (DIAGNOSTIC) 1 MG IJ SOLR: 1.0000 mg | INTRAMUSCULAR | Status: DC | PRN

## 2024-07-13 MED ORDER — IPRATROPIUM-ALBUTEROL 0.5-2.5 (3) MG/3ML IN SOLN: 3.0000 mL | RESPIRATORY_TRACT | Status: DC | PRN

## 2024-07-13 MED ORDER — HYDRALAZINE HCL 20 MG/ML IJ SOLN: 10.0000 mg | INTRAMUSCULAR | Status: DC | PRN

## 2024-07-13 MED ORDER — GUAIFENESIN 100 MG/5ML PO LIQD: 5.0000 mL | ORAL | Status: DC | PRN

## 2024-07-13 MED ORDER — ONDANSETRON HCL 4 MG/2ML IJ SOLN
4.0000 mg | Freq: Four times a day (QID) | INTRAMUSCULAR | Status: DC | PRN
  Filled 2024-07-13: qty 2

## 2024-07-13 MED ORDER — TRAZODONE HCL 50 MG PO TABS
50.0000 mg | ORAL_TABLET | Freq: Every evening | ORAL | Status: DC | PRN
  Administered 2024-07-13 – 2024-07-26 (×8): 50 mg via ORAL
  Filled 2024-07-13 (×8): qty 1

## 2024-07-13 MED ORDER — METOPROLOL TARTRATE 5 MG/5ML IV SOLN: 5.0000 mg | INTRAVENOUS | Status: DC | PRN

## 2024-07-13 NOTE — Hospital Course (Addendum)
 Brief Narrative:   78 y.o. female with past medical history  of   T2DM, HLD, HTN, cervical and rectal cancer s/p colostomy, diabetic foot ulcer, neuropathy, hypothyroidism, hypomagnesemia, anemia and arthritis,   PAD left lower extremity angiogram with left common iliac artery stenting on 05/30/2024 by Dr. Sheree for critical limb ischemia for left heel ulceration , seen in clinic at vascular for left heel ulcer and foul smelling drainage, and pt was advised for left BKA.Pt was scheduled for Necrotic left heel ulcer bka.  Since her recent admission and discharge in July for same pt's ulcer had not healed, rather gotten worse.  Pt seen in PACU and no HPI. Very somnolent and sedated ahs not recovered from anesthesia.   Upon admission she slowly improved.  She was eventually cleared by vascular surgery for discharge, home Plavix  was resumed.  PT/OT recommended SNF.  Due to some drop in hemoglobin required 1 unit PRBC transfusion on 8/28.  Family updated during the hospitalization.  SNF placement.  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 -Pain control and bowel regimen.  Resume Plavix  and statin -No further need for antibiotics, discussed with vascular  Mechanical fall with recent left femur fracture -Patient had a fall requiring ORIF in July 2025.  Recovering as expected.  Continue to closely monitor  Diabetes mellitus type 2, uncontrolled due to hyperglycemia -Sliding scale and Accu-Chek. On semglee , resume metformin  upon dc  Hypomagnesemia/HypoKalemia -Aggressive repletion.  Will prescribe mag oxide upon discharge due to recurrent hypomagnesemia despite of normal potassium and phosphorus.  Recheck electrolytes again in next 3-5 days at SNF  Essential hypertension -IV as needed  History of cervical/rectal cancer status post  colectomy -Follows outpatient oncology team  Anemia of chronic disease/ - Baseline hemoglobin around 10.0, postop drifted down.  S/p 1U Prbc  8/28, now stable.   Hyperlipidemia -Lipitor  Hypothyroidism -Synthroid   GERD -PPI  Mood disorder -Zoloft   TOC consulted for SNF placement  DVT prophylaxis: Subcu heparin     Code Status: Full Code Family Communication: Son and husband at bedside Status is: Inpatient Remains inpatient appropriate because SNF placement   PT Follow up Recs: Skilled Nursing-Short Term Rehab (<3 Hours/Day)07/13/2024 1200  Subjective: No complaints awaiting SNF placement  Examination:  General exam: Appears calm and comfortable  Respiratory system: Clear to auscultation. Respiratory effort normal. Cardiovascular system: S1 & S2 heard, RRR. No JVD, murmurs, rubs, gallops or clicks. No pedal edema. Gastrointestinal system: Abdomen is nondistended, soft and nontender. No organomegaly or masses felt. Normal bowel sounds heard. Central nervous system: Alert and oriented to her name. No focal neurological deficits. Extremities: Left BKA noted Skin: No rashes, lesions or ulcers Psychiatry: Judgement and insight appear poor

## 2024-07-13 NOTE — Evaluation (Signed)
 Physical Therapy Evaluation Patient Details Name: [name] MRN: [id number] DOB: 1946-09-20 Today's Date: 07/13/2024  History of Present Illness  [name] is a 78 y.o. female admitted 07/12/24 for planned L BKA d/t necrotic non-healing left heel ulcer. PMHx: T2DM, HLD, HTN, cervical and rectal CA s/p colostomy, PAD s/p  left common iliac artery stenting (05/30/24), neuropathy, diabetic foot ulcer, hypothyroidism, arthritis, and anemia.   Clinical Impression  Pt admitted with above diagnosis. PTA, pt was modI for functional mobility using a ambulator and modI for most ADLs. She lives with her husband in a one story house with a ramped entrance. Pt currently with functional limitations due to the deficits listed below (see PT Problem List). She required modA x2 for bed mobility and max-totalA for bed>chair transfer using RW. Pt will benefit from acute skilled PT to increase their independence and safety with mobility to allow discharge. Recommend continued inpatient follow up therapy, <3 hours/day.    If plan is discharge home, recommend the following: Two people to help with walking and/or transfers;Two people to help with bathing/dressing/bathroom;Assistance with cooking/housework;Assist for transportation;Help with stairs or ramp for entrance   Can travel by private vehicle   No    Equipment Recommendations Rolling walker (2 wheels)  Recommendations for Other Services       Functional Status Assessment Patient has had a recent decline in their functional status and demonstrates the ability to make significant improvements in function in a reasonable and predictable amount of time.     Precautions / Restrictions Precautions Precautions: Fall;Other (comment) (Contact) Recall of Precautions/Restrictions: Impaired Required Braces or Orthoses: Other Brace Other Brace: Limb Guard LLE Restrictions Weight Bearing Restrictions Per Provider Order: Yes LLE Weight Bearing Per  Provider Order: Non weight bearing      Mobility  Bed Mobility Overal bed mobility: Needs Assistance Bed Mobility: Supine to Sit     Supine to sit: HOB elevated, Used rails, Mod assist, +2 for physical assistance     General bed mobility comments: Pt sat up on L side of bed with increased time. She started to bring BLE off EOB. Cues for sequencing. Assist to manage LLE. Use of bed pad to pivot hips and scoot fwd til foot supported.    Transfers Overall transfer level: Needs assistance Equipment used: Rolling walker (2 wheels) Transfers: Sit to/from Stand, Bed to chair/wheelchair/BSC Sit to Stand: From elevated surface, Max assist, +2 physical assistance Stand pivot transfers: From elevated surface, Max assist, Total assist, +2 physical assistance         General transfer comment: Pt stood from raised bed height. Cued proper hand placement using RW. Powered up with maxA x2. Transferred to recliner chair on right positioned close by to bed. Assist to support pt, provide stability, and facilitate pivot turn at hips. Pt quickly fatigued and required totalA x2 to safely complete, manuever RW, and ease down onto chair. Pt scooted back in chair with BUE support on arm rests and assist via bed pad.    Ambulation/Gait               General Gait Details: Unable  Stairs            Wheelchair Mobility     Tilt Bed    Modified Rankin (Stroke Patients Only)       Balance Overall balance assessment: Needs assistance Sitting-balance support: Single extremity supported, Feet supported, Bilateral upper extremity supported Sitting balance-Leahy Scale: Fair     Standing balance support:  Bilateral upper extremity supported, During functional activity, Reliant on assistive device for balance Standing balance-Leahy Scale: Zero Standing balance comment: Pt dependent on RW and required max-totalA x2                             Pertinent Vitals/Pain Pain  Assessment Pain Assessment: 0-10 Pain Score: 8  Pain Location: LLE Pain Descriptors / Indicators: Operative site guarding, Guarding, Discomfort, Aching, Sore Pain Intervention(s): Monitored during session, Limited activity within patient's tolerance, RN gave pain meds during session, Repositioned    Home Living Family/patient expects to be discharged to:: Private residence Living Arrangements: Spouse/significant other Available Help at Discharge: Family;Available 24 hours/day (daughter and son live nearby) Type of Home: House Home Access: Ramped entrance       Home Layout: One level Home Equipment: BSC/3in1;Rollator (4 wheels);Shower seat;Hand held shower head;Grab bars - tub/shower;Grab bars - toilet;Wheelchair - Careers adviser (comment) (Daughter reports purchasing a Nurse, adult)      Prior Function Prior Level of Function : Independent/Modified Independent;Needs assist       Physical Assist : ADLs (physical)   ADLs (physical): Dressing;Bathing Mobility Comments: Ambualtes using rollator. 1 fall in the past 18mo. ADLs Comments: ModI for the majority of ADLs. Daughter provides supervision and intermittent support for bathing and aids with LB dressing. Pt was cooking and doing the dishes     Extremity/Trunk Assessment   Upper Extremity Assessment Upper Extremity Assessment: Defer to OT evaluation    Lower Extremity Assessment Lower Extremity Assessment: Generalized weakness;LLE deficits/detail LLE Deficits / Details: Pt s/p BKA with limb protector and shrinker donned. Limited hip/knee AROM. Pt demonstrated ability to complete hip abd. Grossly 2/5 strength. LLE: Unable to fully assess due to pain LLE Sensation: history of peripheral neuropathy;decreased light touch;decreased proprioception LLE Coordination: decreased gross motor       Communication   Communication Communication: No apparent difficulties    Cognition Arousal: Alert Behavior During Therapy: WFL for tasks  assessed/performed   PT - Cognitive impairments: Orientation, Problem solving, Safety/Judgement   Orientation impairments: Situation (Pt took increased time and required prompts to state the year she was born as well as the current year it was. She reported she was hospitalized to learn to walk again.)                   PT - Cognition Comments: Pt pleasantly confused. She displayed decreased insight and poor safety awareness. Following commands: Impaired Following commands impaired: Only follows one step commands consistently, Follows multi-step commands inconsistently, Follows multi-step commands with increased time     Cueing Cueing Techniques: Verbal cues, Gestural cues, Visual cues     General Comments      Exercises     Assessment/Plan    PT Assessment Patient needs continued PT services  PT Problem List Decreased strength;Decreased range of motion;Decreased activity tolerance;Decreased balance;Decreased mobility;Decreased knowledge of use of DME;Decreased safety awareness;Pain       PT Treatment Interventions DME instruction;Gait training;Functional mobility training;Therapeutic activities;Therapeutic exercise;Balance training;Patient/family education;Wheelchair mobility training    PT Goals (Current goals can be found in the Care Plan section)  Acute Rehab PT Goals Patient Stated Goal: Return Home PT Goal Formulation: With patient/family Time For Goal Achievement: 07/27/24 Potential to Achieve Goals: Fair    Frequency Min 2X/week     Co-evaluation PT/OT/SLP Co-Evaluation/Treatment: Yes Reason for Co-Treatment: For patient/therapist safety;To address functional/ADL transfers PT goals addressed during session: Mobility/safety with mobility;Balance  AM-PAC PT 6 Clicks Mobility  Outcome Measure Help needed turning from your back to your side while in a flat bed without using bedrails?: Total Help needed moving from lying on your back to sitting on  the side of a flat bed without using bedrails?: Total Help needed moving to and from a bed to a chair (including a wheelchair)?: Total Help needed standing up from a chair using your arms (e.g., wheelchair or bedside chair)?: Total Help needed to walk in hospital room?: Total Help needed climbing 3-5 steps with a railing? : Total 6 Click Score: 6    End of Session Equipment Utilized During Treatment: Gait belt Activity Tolerance: Patient tolerated treatment well;Patient limited by pain Patient left: in chair;with call bell/phone within reach;with family/visitor present Nurse Communication: Mobility status;Need for lift equipment PT Visit Diagnosis: Pain;Difficulty in walking, not elsewhere classified (R26.2);Unsteadiness on feet (R26.81);Other abnormalities of gait and mobility (R26.89) Pain - Right/Left: Left Pain - part of body: Knee;Leg    Time: 8953-8885 PT Time Calculation (min) (ACUTE ONLY): 28 min   Charges:   PT Evaluation $PT Eval Moderate Complexity: 1 Mod   PT General Charges $$ ACUTE PT VISIT: 1 Visit         Randall SAUNDERS, PT, DPT Acute Rehabilitation Services Office: [phone] Secure Chat Preferred  Delon CHRISTELLA Callander 07/13/2024, 12:30 PM

## 2024-07-13 NOTE — Plan of Care (Signed)
  Problem: Education: Goal: Knowledge of General Education information will improve Description: Including pain rating scale, medication(s)/side effects and non-pharmacologic comfort measures Outcome: Progressing   Problem: Health Behavior/Discharge Planning: Goal: Ability to manage health-related needs will improve Outcome: Progressing   Problem: Clinical Measurements: Goal: Ability to maintain clinical measurements within normal limits will improve Outcome: Progressing Goal: Will remain free from infection Outcome: Progressing Goal: Diagnostic test results will improve Outcome: Progressing Goal: Respiratory complications will improve Outcome: Progressing Goal: Cardiovascular complication will be avoided Outcome: Progressing   Problem: Activity: Goal: Risk for activity intolerance will decrease Outcome: Progressing   Problem: Nutrition: Goal: Adequate nutrition will be maintained Outcome: Progressing   Problem: Coping: Goal: Level of anxiety will decrease Outcome: Progressing   Problem: Elimination: Goal: Will not experience complications related to bowel motility Outcome: Progressing Goal: Will not experience complications related to urinary retention Outcome: Progressing   Problem: Pain Managment: Goal: General experience of comfort will improve and/or be controlled Outcome: Progressing   Problem: Safety: Goal: Ability to remain free from injury will improve Outcome: Progressing   Problem: Skin Integrity: Goal: Risk for impaired skin integrity will decrease Outcome: Progressing   Problem: Education: Goal: Ability to describe self-care measures that may prevent or decrease complications (Diabetes Survival Skills Education) will improve Outcome: Progressing Goal: Individualized Educational Video(s) Outcome: Progressing   Problem: Coping: Goal: Ability to adjust to condition or change in health will improve Outcome: Progressing   Problem: Fluid  Volume: Goal: Ability to maintain a balanced intake and output will improve Outcome: Progressing   Problem: Health Behavior/Discharge Planning: Goal: Ability to identify and utilize available resources and services will improve Outcome: Progressing Goal: Ability to manage health-related needs will improve Outcome: Progressing   Problem: Metabolic: Goal: Ability to maintain appropriate glucose levels will improve Outcome: Progressing   Problem: Nutritional: Goal: Maintenance of adequate nutrition will improve Outcome: Progressing Goal: Progress toward achieving an optimal weight will improve Outcome: Progressing   Problem: Skin Integrity: Goal: Risk for impaired skin integrity will decrease Outcome: Progressing   Problem: Tissue Perfusion: Goal: Adequacy of tissue perfusion will improve Outcome: Progressing   Problem: Clinical Measurements: Goal: Ability to avoid or minimize complications of infection will improve Outcome: Progressing   Problem: Skin Integrity: Goal: Skin integrity will improve Outcome: Progressing   Problem: Education: Goal: Knowledge of the prescribed therapeutic regimen will improve Outcome: Progressing Goal: Ability to verbalize activity precautions or restrictions will improve Outcome: Progressing Goal: Understanding of discharge needs will improve Outcome: Progressing   Problem: Activity: Goal: Ability to perform//tolerate increased activity and mobilize with assistive devices will improve Outcome: Progressing   Problem: Clinical Measurements: Goal: Postoperative complications will be avoided or minimized Outcome: Progressing   Problem: Self-Care: Goal: Ability to meet self-care needs will improve Outcome: Progressing   Problem: Self-Concept: Goal: Ability to maintain and perform role responsibilities to the fullest extent possible will improve Outcome: Progressing   Problem: Pain Management: Goal: Pain level will decrease with  appropriate interventions Outcome: Progressing   Problem: Skin Integrity: Goal: Demonstration of wound healing without infection will improve Outcome: Progressing

## 2024-07-13 NOTE — Progress Notes (Addendum)
  Progress Note    07/13/2024 6:40 AM 1 Day Post-Op  Subjective:  says she his having some pain in the left leg.  Wants to know where she's at.   Afebrile  Vitals:   07/13/24 0015 07/13/24 0414  BP: (!) 121/37 (!) 133/37  Pulse: (!) 58 (!) 59  Resp: 20 16  Temp: 98.6 F (37 C) 98 F (36.7 C)  SpO2: 98% 100%    Physical Exam: Incisions:  shrinker sock and limb guard in place.    CBC    Component Value Date/Time   WBC 10.3 07/13/2024 0242   RBC 3.05 (L) 07/13/2024 0242   HGB 7.5 (L) 07/13/2024 0242   HGB 9.3 (L) 06/23/2024 1105   HCT 25.7 (L) 07/13/2024 0242   PLT 218 07/13/2024 0242   PLT 212 06/23/2024 1105   MCV 84.3 07/13/2024 0242   MCH 24.6 (L) 07/13/2024 0242   MCHC 29.2 (L) 07/13/2024 0242   RDW 17.0 (H) 07/13/2024 0242   LYMPHSABS 0.9 06/23/2024 1105   MONOABS 0.7 06/23/2024 1105   EOSABS 0.0 06/23/2024 1105   BASOSABS 0.0 06/23/2024 1105    BMET    Component Value Date/Time   NA 138 07/13/2024 0242   NA 144 08/21/2017 1553   K 3.8 07/13/2024 0242   CL 105 07/13/2024 0242   CO2 23 07/13/2024 0242   GLUCOSE 168 (H) 07/13/2024 0242   BUN 21 07/13/2024 0242   BUN 21 08/21/2017 1553   CREATININE 0.77 07/13/2024 0242   CREATININE 0.74 06/23/2024 1105   CALCIUM  8.1 (L) 07/13/2024 0242   CALCIUM  9.6 09/19/2010 2220   GFRNONAA >60 07/13/2024 0242   GFRNONAA >60 06/23/2024 1105   GFRAA >60 03/29/2020 0314    INR    Component Value Date/Time   INR 1.2 07/12/2024 0919     Intake/Output Summary (Last 24 hours) at 07/13/2024 0640 Last data filed at 07/12/2024 2117 Gross per 24 hour  Intake 910 ml  Output 250 ml  Net 660 ml     Assessment/Plan:  78 y.o. female is s/p left below knee amputation 07/12/2024 by Dr. Sheree  1 Day Post-Op   -left BKA with shrinker sock and limb guard in place.  Most likely will remove dressing tomorrow or Friday.  PT/OT eval and treat today.  -pt would like for Dr. Sheree to call her daughter today. -acute surgical  blood loss anemia-hgb 7.5 this am--tolerating.    Lucie Apt, PA-C [facility] [phone] 07/13/2024 6:40 AM   I have independently interviewed and examined patient and agree with PA assessment and plan above.   Liylah Najarro C. Sheree, MD [facility] Office: [phone] Pager: [phone]

## 2024-07-13 NOTE — Progress Notes (Signed)
 PROGRESS NOTE    [name]  FMW:[id number] DOB: 07/03/1946 DOA: 07/12/2024 PCP: Waylan Almarie SAUNDERS, MD    Brief Narrative:   78 y.o. female with past medical history  of   T2DM, HLD, HTN, cervical and rectal cancer s/p colostomy, diabetic foot ulcer, neuropathy, hypothyroidism, hypomagnesemia, anemia and arthritis,   PAD left lower extremity angiogram with left common iliac artery stenting on 05/30/2024 by Dr. Sheree for critical limb ischemia for left heel ulceration , seen in clinic at vascular for left heel ulcer and foul smelling drainage, and pt was advised for left BKA.Pt was scheduled for Necrotic left heel ulcer bka.  Since her recent admission and discharge in July for same pt's ulcer had not healed, rather gotten worse.  Pt seen in PACU and no HPI. Very somnolent and sedated ahs not recovered from anesthesia.   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  Mechanical fall with recent left femur fracture -Patient had a fall requiring ORIF in July 2025.  Recovering as expected.  Continue to closely monitor  Diabetes mellitus type 2 -Sliding scale and Accu-Chek  Hypomagnesemia -Aggressive repletion.  Check calcium , phosphorus  Essential hypertension -IV as needed  History of cervical/rectal cancer status post colectomy -Follows outpatient oncology team  Anemia of chronic disease -Hemoglobin 7.5, baseline hemoglobin around 10.0.  LIkely is acute blood loss anemia.  Monitor and transfuse as necessary  Hyperlipidemia -Lipitor  Hypothyroidism -Synthroid   GERD -PPI  Mood disorder -Zoloft    DVT prophylaxis: Subcu heparin     Code Status: Full Code Family Communication:   Status is: Inpatient Remains inpatient appropriate because: Pending therapy evaluation and vascular clearance see   PT Follow up Recs:    Subjective:  At bedside, pleasantly confused Awaiting therapy to start working with  Examination:  General exam: Appears calm and comfortable  Respiratory system: Clear to auscultation. Respiratory effort normal. Cardiovascular system: S1 & S2 heard, RRR. No JVD, murmurs, rubs, gallops or clicks. No pedal edema. Gastrointestinal system: Abdomen is nondistended, soft and nontender. No organomegaly or masses felt. Normal bowel sounds heard. Central nervous system: Alert and oriented to her name. No focal neurological deficits. Extremities: Left BKA noted Skin: No rashes, lesions or ulcers Psychiatry: Judgement and insight appear poor                Diet Orders (From admission, onward)     Start     Ordered   07/12/24 1532  Diet Carb Modified Fluid consistency: Thin; Room service appropriate? Yes  Diet effective now       Question Answer Comment  Diet-HS Snack? Nothing   Calorie Level Medium 1600-2000   Fluid consistency: Thin   Room service appropriate? Yes      07/12/24 1532            Objective: Vitals:   07/12/24 2018 07/13/24 0015 07/13/24 0414 07/13/24 0828  BP: (!) 123/41 (!) 121/37 (!) 133/37 (!) 132/36  Pulse: (!) 51 (!) 58 (!) 59 66  Resp: 18 20 16 16   Temp: 98.4 F (36.9 C) 98.6 F (37 C) 98 F (36.7 C) 98.9 F (37.2 C)  TempSrc: Oral Oral Oral   SpO2: 95% 98% 100% 100%  Weight:   66.1 kg   Height:        Intake/Output Summary (Last 24 hours) at 07/13/2024 1156 Last  data filed at 07/13/2024 0842 Gross per 24 hour  Intake 350.93 ml  Output --  Net 350.93 ml   Filed Weights   07/11/24 1005 07/12/24 0900 07/13/24 0414  Weight: 71.7 kg 71.7 kg 66.1 kg    Scheduled Meds:  atorvastatin   40 mg Oral QHS   Chlorhexidine  Gluconate Cloth  6 each Topical Once   heparin   5,000 Units Subcutaneous Q8H   insulin  aspart  0-15 Units Subcutaneous TID WC   insulin  aspart  0-5 Units Subcutaneous QHS   levothyroxine   150 mcg Oral Q0600    pantoprazole  (PROTONIX ) IV  40 mg Intravenous Q12H   sertraline   150 mg Oral QHS   sodium chloride  flush  10-40 mL Intracatheter Q12H   Continuous Infusions:  ceFEPime  (MAXIPIME ) IV Stopped (07/13/24 0530)   vancomycin       Nutritional status     Body mass index is 28.46 kg/m.  Data Reviewed:   CBC: Recent Labs  Lab 07/12/24 0919 07/12/24 1828 07/12/24 1952 07/13/24 0242  WBC 9.0 7.0  --  10.3  HGB 11.3* 7.6* 8.2* 7.5*  HCT 39.4 25.8* 28.0* 25.7*  MCV 86.0 83.2  --  84.3  PLT 235 189  --  218   Basic Metabolic Panel: Recent Labs  Lab 07/12/24 0919 07/12/24 1828 07/13/24 0242  NA 138  --  138  K 4.0  --  3.8  CL 104  --  105  CO2 21*  --  23  GLUCOSE 128*  --  168*  BUN 21  --  21  CREATININE 0.81 0.75 0.77  CALCIUM  9.3  --  8.1*  MG  --  0.9* 2.1  PHOS  --   --  3.7   GFR: Estimated Creatinine Clearance: 49.1 mL/min (by C-G formula based on SCr of 0.77 mg/dL). Liver Function Tests: Recent Labs  Lab 07/12/24 0919 07/13/24 0242  AST 25 21  ALT 21 15  ALKPHOS 126 96  BILITOT 0.5 0.6  PROT 7.0 5.3*  ALBUMIN 2.6* 2.0*   No results for input(s): LIPASE, AMYLASE in the last 168 hours. No results for input(s): AMMONIA in the last 168 hours. Coagulation Profile: Recent Labs  Lab 07/12/24 0919  INR 1.2   Cardiac Enzymes: No results for input(s): CKTOTAL, CKMB, CKMBINDEX, TROPONINI in the last 168 hours. BNP (last 3 results) No results for input(s): PROBNP in the last 8760 hours. HbA1C: Recent Labs    07/12/24 1828  HGBA1C 6.0*   CBG: Recent Labs  Lab 07/12/24 1143 07/12/24 1641 07/12/24 2057 07/13/24 0624 07/13/24 1125  GLUCAP 171* 177* 203* 133* 275*   Lipid Profile: No results for input(s): CHOL, HDL, LDLCALC, TRIG, CHOLHDL, LDLDIRECT in the last 72 hours. Thyroid  Function Tests: No results for input(s): TSH, T4TOTAL, FREET4, T3FREE, THYROIDAB in the last 72 hours. Anemia Panel: No results for  input(s): VITAMINB12, FOLATE, FERRITIN, TIBC, IRON, RETICCTPCT in the last 72 hours. Sepsis Labs: Recent Labs  Lab 07/12/24 1828  LATICACIDVEN 0.7    Recent Results (from the past 240 hours)  Surgical pcr screen     Status: None   Collection Time: 07/12/24  9:25 AM   Specimen: Nasal Swab  Result Value Ref Range Status   MRSA, PCR NEGATIVE NEGATIVE Final   Staphylococcus aureus NEGATIVE NEGATIVE Final    Comment: (NOTE) The Xpert SA Assay (FDA approved for NASAL specimens in patients 22 years of age and older), is one component of a comprehensive surveillance program. It is not intended  to diagnose infection nor to guide or monitor treatment. Performed at [facility], [address], [place], [place] [address]          Radiology Studies: No results found.         LOS: 1 day   Time spent= 35 mins    Burgess JAYSON Dare, MD [facility]  If 7PM-7AM, please contact night-coverage  07/13/2024, 11:56 AM

## 2024-07-13 NOTE — Consult Note (Addendum)
 WOC Nurse ostomy consult note; patient with longstanding descending loop colostomy placed at [facility] 03/2021; patient and daughter independent in care at home  Stoma type/location: LLQ loop colostomy  Stomal assessment/size: 3/4 pink flush with skin per last assessment  Peristomal assessment:  Treatment options for stomal/peristomal skin: 2 barrier ring  Output  Ostomy pouching: 2 piece flexible convex 2 1/4 soft convex skin barrier Gerlean #[id number], 2 1/4 pouch Gerlean #[id number] and 2 barrier ring Gerlean [phone]  Education provided:  none, patient independent  Enrolled patient in [employer] DC program: no, established ostomy    If skin is denuded around stoma crust as follows:  Sprinkle stoma powder Soila #6) over any irritated skin, brush away excess powder Tap lightly over the powder with Cavilon No-Sting barrier wipe (skin barrier wipe-white and blue package)  Allow to dry Apply another layer of powder following the same steps up to three layers.  After dry proceed with routine pouching    WOC team will not follow this established ostomy.  Please reconsult if further needs arise.   Thank you,    Powell Bar MSN, RN-BC, [employer]

## 2024-07-13 NOTE — Evaluation (Signed)
 Occupational Therapy Evaluation Patient Details Name: [name] MRN: [id number] DOB: 11-25-1945 Today's Date: 07/13/2024   History of Present Illness   [name] is a 78 y.o. female admitted 07/12/24 for planned L BKA d/t necrotic non-healing left heel ulcer. PMHx: T2DM, HLD, HTN, cervical and rectal CA s/p colostomy, PAD s/p  left common iliac artery stenting (05/30/24), neuropathy, diabetic foot ulcer, hypothyroidism, arthritis, and anemia.     Clinical Impressions At baseline, pt is largely Mod I to Contact guard assist with ADLs and Mod I for functional mobility with a Rollator. Pt now presents with decreased activity tolerance, pain affecting functional level, decreased balance, decreased cognition, generalized B UE weakness, and decreased safety and independence with functional tasks. Pt currently demonstrates ability to largely complete ADLs with Set up to Total assist +2, bed mobility with Mod assist +2, and functional stand-pivot transfers with a RW with Max-Total assist +2. Pt VSS on RA. Pt participated well ins session, is motivated to return to PLOF, and has good family support. Pt will benefit from acute skilled OT services to address deficits and increase safety and independence with functional tasks. Post acute discharge, pt will benefit from intensive inpatient skilled rehab services < 3 hours per day to maximize rehab potential.      If plan is discharge home, recommend the following:   Two people to help with walking and/or transfers;Two people to help with bathing/dressing/bathroom;Assistance with cooking/housework;Direct supervision/assist for medications management;Direct supervision/assist for financial management;Assist for transportation;Help with stairs or ramp for entrance;Supervision due to cognitive status     Functional Status Assessment   Patient has had a recent decline in their functional status and demonstrates the ability to make significant  improvements in function in a reasonable and predictable amount of time.     Equipment Recommendations   Other (comment) (RW)     Recommendations for Other Services         Precautions/Restrictions   Precautions Precautions: Fall;Other (comment) (Contact) Recall of Precautions/Restrictions: Impaired Required Braces or Orthoses: Other Brace Other Brace: Limb Guard LLE Restrictions Weight Bearing Restrictions Per Provider Order: Yes LLE Weight Bearing Per Provider Order: Non weight bearing     Mobility Bed Mobility Overal bed mobility: Needs Assistance Bed Mobility: Supine to Sit     Supine to sit: Mod assist, +2 for physical assistance, HOB elevated, Used rails     General bed mobility comments: Pt sat up on L side of bed with increased time. She started to bring BLE off EOB. Cues for sequencing. Assist to manage LLE. Use of bed pad to pivot hips and scoot fwd til foot supported.    Transfers Overall transfer level: Needs assistance Equipment used: Rolling walker (2 wheels) Transfers: Sit to/from Stand, Bed to chair/wheelchair/BSC Sit to Stand: Max assist, +2 physical assistance, From elevated surface Stand pivot transfers: Max assist, Total assist, +2 physical assistance, From elevated surface         General transfer comment: Pt stood from raised bed height. Cued proper hand placement using RW. Powered up with maxA x2. Transferred to recliner chair on right positioned close by to bed. Assist to support pt, provide stability, and facilitate pivot turn at hips. Pt quickly fatigued and required totalA x2 to safely complete, manuever RW, and ease down onto chair. Pt scooted back in chair with BUE support on arm rests and assist via bed pad.      Balance Overall balance assessment: Needs assistance Sitting-balance support: Single extremity supported, Bilateral  upper extremity supported, Feet supported Sitting balance-Leahy Scale: Fair     Standing balance  support: Bilateral upper extremity supported, During functional activity, Reliant on assistive device for balance Standing balance-Leahy Scale: Zero Standing balance comment: Pt dependent on RW and required max-totalA x2                           ADL either performed or assessed with clinical judgement   ADL Overall ADL's : Needs assistance/impaired Eating/Feeding: Set up;Sitting   Grooming: Set up;Contact guard assist;Sitting   Upper Body Bathing: Contact guard assist;Minimal assistance;Sitting   Lower Body Bathing: Maximal assistance;+2 for physical assistance;Bed level;Cueing for compensatory techniques   Upper Body Dressing : Contact guard assist;Minimal assistance;Sitting   Lower Body Dressing: Maximal assistance;Total assistance;+2 for physical assistance;Bed level;Cueing for compensatory techniques   Toilet Transfer: Maximal assistance;Total assistance;+2 for physical assistance;+2 for safety/equipment;Stand-pivot;BSC/3in1;Rolling walker (2 wheels) Toilet Transfer Details (indicate cue type and reason): simulated bed to recliner Toileting- Clothing Manipulation and Hygiene: Total assistance;+2 for physical assistance;Bed level;Sit to/from stand         General ADL Comments: Pt with decreased activity tolerance, pain, and impaired cogntiion affecting functional level.     Vision Baseline Vision/History: 1 Wears glasses Ability to See in Adequate Light: 0 Adequate Patient Visual Report: No change from baseline Additional Comments: Vision [facility] for tasks assessed. Not formally screened or assessed     Perception         Praxis         Pertinent Vitals/Pain Pain Assessment Pain Assessment: 0-10 Pain Score: 8  Pain Location: LLE Pain Descriptors / Indicators: Operative site guarding, Guarding, Discomfort, Aching, Sore Pain Intervention(s): Limited activity within patient's tolerance, Monitored during session, Repositioned, RN gave pain meds during session      Extremity/Trunk Assessment Upper Extremity Assessment Upper Extremity Assessment: Right hand dominant;Generalized weakness (Pt's daughter reports pt with hx of CVA causing increased R side weakness and impaired coordiantion. However, B strength, ROM, and coordination largely symmetrical this session. With gross B UE strength 3/5 and coordination and ROM WFL.)   Lower Extremity Assessment Lower Extremity Assessment: Defer to PT evaluation LLE Deficits / Details: Pt s/p BKA with limb protector and shrinker donned. Limited hip/knee AROM. Pt demonstrated ability to complete hip abd. Grossly 2/5 strength. LLE: Unable to fully assess due to pain LLE Sensation: history of peripheral neuropathy;decreased light touch;decreased proprioception LLE Coordination: decreased gross motor       Communication Communication Communication: No apparent difficulties   Cognition Arousal: Alert Behavior During Therapy: WFL for tasks assessed/performed Cognition: Cognition impaired   Orientation impairments: Situation (Pt took increased time and required prompts to state the year she was born as well as the current year. She reported she was hospitalized to learn to walk again.) Awareness: Intellectual awareness intact, Online awareness impaired Memory impairment (select all impairments): Short-term memory, Working Civil [employer] fast streamer, Conservation officer, historic buildings (requires increased time and occasional cues for declarative memory) Attention impairment (select first level of impairment): Selective attention Executive functioning impairment (select all impairments): Organization, Reasoning, Problem solving OT - Cognition Comments: Pt pleasantly confused throughout session.                 Following commands: Impaired Following commands impaired: Only follows one step commands consistently, Follows multi-step commands inconsistently, Follows multi-step commands with increased time     Cueing  General  Comments   Cueing Techniques: Verbal cues;Gestural cues;Visual cues  VSS on RA.  Pt's husband and daughter present during majority of session and supportive. RN present during a portion of session.   Exercises     Shoulder Instructions      Home Living Family/patient expects to be discharged to:: Private residence Living Arrangements: Spouse/significant other Available Help at Discharge: Family;Available 24 hours/day (daughter and son live nearby) Type of Home: House Home Access: Ramped entrance     Home Layout: One level     Bathroom Shower/Tub: Producer, television/film/video: Standard Bathroom Accessibility: No   Home Equipment: BSC/3in1;Rollator (4 wheels);Shower seat;Hand held shower head;Grab bars - tub/shower;Grab bars - toilet;Wheelchair - Careers adviser (comment) (Daughter reports she is in the process of purchasing a Nurse, adult)          Prior Functioning/Environment Prior Level of Function : Independent/Modified Independent;Needs assist       Physical Assist : ADLs (physical)   ADLs (physical): Dressing;Bathing Mobility Comments: Ambulates using rollator. 1 fall in the past 24mo. ADLs Comments: Daughter provides supervision and assistance for washing/drying B feet and assistance with donning/doffing socks/shoes and threading B LE into clothing. Pt otherwise Mod I with ADLs. Pt was cooking light meals and doing the dishes. Daughter provides all transportation.    OT Problem List: Decreased strength;Decreased activity tolerance;Impaired balance (sitting and/or standing);Decreased cognition;Decreased safety awareness;Decreased knowledge of use of DME or AE;Decreased knowledge of precautions;Pain   OT Treatment/Interventions: Self-care/ADL training;Therapeutic exercise;DME and/or AE instruction;Therapeutic activities;Cognitive remediation/compensation;Patient/family education;Balance training      OT Goals(Current goals can be found in the care plan section)    Acute Rehab OT Goals Patient Stated Goal: to return home and to have less pain OT Goal Formulation: With patient/family Time For Goal Achievement: 07/27/24 Potential to Achieve Goals: Good ADL Goals Pt Will Perform Upper Body Bathing: with supervision;sitting Pt Will Perform Lower Body Bathing: with mod assist;with adaptive equipment;sitting/lateral leans;sit to/from stand Pt Will Perform Lower Body Dressing: with mod assist;with adaptive equipment;sitting/lateral leans;sit to/from stand Pt Will Transfer to Toilet: with mod assist;ambulating;bedside commode (with least restrictive AD) Pt Will Perform Toileting - Clothing Manipulation and hygiene: with mod assist;sitting/lateral leans Pt/caregiver will Perform Home Exercise Program: Increased strength;Both right and left upper extremity;With theraband;With written HEP provided;With Supervision   OT Frequency:  Min 2X/week    Co-evaluation PT/OT/SLP Co-Evaluation/Treatment: Yes Reason for Co-Treatment: For patient/therapist safety;To address functional/ADL transfers PT goals addressed during session: Mobility/safety with mobility;Balance OT goals addressed during session: ADL's and self-care;Strengthening/ROM;Other (comment) (Cognition)      AM-PAC OT 6 Clicks Daily Activity     Outcome Measure Help from another person eating meals?: A Little Help from another person taking care of personal grooming?: A Little Help from another person toileting, which includes using toliet, bedpan, or urinal?: Total Help from another person bathing (including washing, rinsing, drying)?: A Lot Help from another person to put on and taking off regular upper body clothing?: A Little Help from another person to put on and taking off regular lower body clothing?: Total 6 Click Score: 13   End of Session Equipment Utilized During Treatment: Gait belt;Rolling walker (2 wheels);Other (comment) (L LE limb guard) Nurse Communication: Mobility  status;Weight bearing status;Need for lift equipment;Patient requests pain meds;Other (comment) (RN providing pain medication during session)  Activity Tolerance: Patient tolerated treatment well;Patient limited by pain Patient left: in chair;with call bell/phone within reach;with family/visitor present  OT Visit Diagnosis: Other abnormalities of gait and mobility (R26.89);Unsteadiness on feet (R26.81);History of falling (Z91.81);Muscle weakness (generalized) (M62.81);Other symptoms and signs involving  cognitive function;Pain                Time: 8953-8885 OT Time Calculation (min): 28 min Charges:  OT General Charges $OT Visit: 1 Visit OT Evaluation $OT Eval Moderate Complexity: 1 Mod  Margarie Rockey HERO., OTR/L, MA Acute Rehab [phone]   Margarie FORBES Horns 07/13/2024, 2:46 PM

## 2024-07-14 DIAGNOSIS — L97429 Non-pressure chronic ulcer of left heel and midfoot with unspecified severity: Secondary | ICD-10-CM | POA: Diagnosis not present

## 2024-07-14 LAB — BASIC METABOLIC PANEL WITH GFR
Anion gap: 10 (ref 5–15)
BUN: 17 mg/dL (ref 8–23)
CO2: 23 mmol/L (ref 22–32)
Calcium: 8 mg/dL — ABNORMAL LOW (ref 8.9–10.3)
Chloride: 105 mmol/L (ref 98–111)
Creatinine, Ser: 0.72 mg/dL (ref 0.44–1.00)
GFR, Estimated: >60 mL/min (ref >60)
Glucose, Bld: 176 mg/dL — ABNORMAL HIGH (ref 70–99)
Potassium: 3.7 mmol/L (ref 3.5–5.1)
Sodium: 138 mmol/L (ref 135–145)

## 2024-07-14 LAB — CBC
HCT: 22.6 % — ABNORMAL LOW (ref 36.0–46.0)
Hemoglobin: 6.5 g/dL — CL (ref 12.0–15.0)
MCH: 24.3 pg — ABNORMAL LOW (ref 26.0–34.0)
MCHC: 28.8 g/dL — ABNORMAL LOW (ref 30.0–36.0)
MCV: 84.3 fL (ref 80.0–100.0)
Platelets: 189 K/uL (ref 150–400)
RBC: 2.68 MIL/uL — ABNORMAL LOW (ref 3.87–5.11)
RDW: 17.4 % — ABNORMAL HIGH (ref 11.5–15.5)
WBC: 7.3 K/uL (ref 4.0–10.5)
nRBC: 0 % (ref 0.0–0.2)

## 2024-07-14 LAB — GLUCOSE, CAPILLARY
Glucose-Capillary: 281 mg/dL — ABNORMAL HIGH (ref 70–99)
Glucose-Capillary: 189 mg/dL — ABNORMAL HIGH (ref 70–99)
Glucose-Capillary: 169 mg/dL — ABNORMAL HIGH (ref 70–99)
Glucose-Capillary: 225 mg/dL — ABNORMAL HIGH (ref 70–99)
Glucose-Capillary: 202 mg/dL — ABNORMAL HIGH (ref 70–99)

## 2024-07-14 LAB — SURGICAL PATHOLOGY

## 2024-07-14 LAB — HEMOGLOBIN AND HEMATOCRIT, BLOOD
HCT: 25 % — ABNORMAL LOW (ref 36.0–46.0)
Hemoglobin: 7.9 g/dL — ABNORMAL LOW (ref 12.0–15.0)

## 2024-07-14 LAB — PHOSPHORUS: Phosphorus: 3 mg/dL (ref 2.5–4.6)

## 2024-07-14 LAB — PREPARE RBC (CROSSMATCH)

## 2024-07-14 LAB — MAGNESIUM: Magnesium: 1.2 mg/dL — ABNORMAL LOW (ref 1.7–2.4)

## 2024-07-14 MED ORDER — MAGNESIUM SULFATE 4 GM/100ML IV SOLN
4.0000 g | Freq: Once | INTRAVENOUS | Status: AC
  Administered 2024-07-14: 4 g via INTRAVENOUS
  Filled 2024-07-14 (×2): qty 100

## 2024-07-14 MED ORDER — INSULIN GLARGINE 100 UNIT/ML ~~LOC~~ SOLN
10.0000 [IU] | Freq: Every day | SUBCUTANEOUS | Status: DC
  Administered 2024-07-14 – 2024-07-22 (×9): 10 [IU] via SUBCUTANEOUS
  Filled 2024-07-14 (×9): qty 0.1

## 2024-07-14 MED ORDER — SODIUM CHLORIDE 0.9% IV SOLUTION: Freq: Once | INTRAVENOUS | Status: AC

## 2024-07-14 MED ORDER — GABAPENTIN 300 MG PO CAPS
300.0000 mg | ORAL_CAPSULE | Freq: Three times a day (TID) | ORAL | Status: DC
  Administered 2024-07-14 – 2024-07-25 (×34): 300 mg via ORAL
  Filled 2024-07-14 (×29): qty 1
  Filled 2024-07-14: qty 3
  Filled 2024-07-14 (×4): qty 1

## 2024-07-14 MED ORDER — OXYCODONE HCL ER 10 MG PO T12A
10.0000 mg | EXTENDED_RELEASE_TABLET | Freq: Two times a day (BID) | ORAL | Status: DC
  Administered 2024-07-14 – 2024-07-25 (×23): 10 mg via ORAL
  Filled 2024-07-14 (×23): qty 1

## 2024-07-14 NOTE — Progress Notes (Signed)
 Physical Therapy Treatment Patient Details Name: [name] MRN: [id number] DOB: 04-30-1946 Today's Date: 07/14/2024   History of Present Illness [name] is a 78 y.o. female admitted 07/12/24 for planned L BKA d/t necrotic non-healing left heel ulcer. PMHx: T2DM, HLD, HTN, cervical and rectal CA s/p colostomy, PAD s/p  left common iliac artery stenting (05/30/24), neuropathy, diabetic foot ulcer, hypothyroidism, arthritis, and anemia.    PT Comments  Pt greeted supine in bed, pleasant and agreeable to PT session. She required increased physical assistance with bed mobility. Pt demonstrated impaired seated balance. She sat EOB for ~41mins with BUE support and min-modA to maintain upright posture d/t right lateral and posterior lean. Educated pt and family on L BKA HEP. Reviewed each exercise and had pt perform a few in supine and seated to demonstrate understaning. Provided pt with handout via [employer] (Access Code: [id number]). Unable to complete OOB mobility this date. Pt limited by pain, lethargy, and fatigue. Will continue to follow acutely and advance appropriately.     If plan is discharge home, recommend the following: Two people to help with walking and/or transfers;Two people to help with bathing/dressing/bathroom;Assistance with cooking/housework;Assist for transportation;Help with stairs or ramp for entrance   Can travel by private vehicle     No  Equipment Recommendations  Rolling walker (2 wheels)    Recommendations for Other Services       Precautions / Restrictions Precautions Precautions: Fall;Other (comment) (Contact) Recall of Precautions/Restrictions: Impaired Required Braces or Orthoses: Other Brace Other Brace: Limb Guard LLE Restrictions Weight Bearing Restrictions Per Provider Order: Yes LLE Weight Bearing Per Provider Order: Non weight bearing     Mobility  Bed Mobility Overal bed mobility: Needs Assistance Bed Mobility: Supine to Sit, Sit to  Supine, Rolling Rolling: Used rails, Mod assist, +2 for physical assistance, +2 for safety/equipment   Supine to sit: Mod assist, Max assist, +2 for physical assistance, +2 for safety/equipment, HOB elevated, Used rails Sit to supine: Mod assist, Max assist, +2 for physical assistance, +2 for safety/equipment, HOB elevated   General bed mobility comments: Pt sat up on R side of bed with increased time. Cues for sequencing. Pt was unable to initate bringing BLE towards EOB, required assist to manage BLE. Pt reached for bed rail. Assist via bed pad to pivot, elevate trunk, and scoot fwd. Returning to bed Thearpy Tech managed trunk and PT managed BLE. Repositioned using bed features, bed pad, and +2 assist.    Transfers                   General transfer comment: Deferred d/t lethargy and worsening pain.    Ambulation/Gait               General Gait Details: Unable   Stairs             Wheelchair Mobility     Tilt Bed    Modified Rankin (Stroke Patients Only)       Balance Overall balance assessment: Needs assistance Sitting-balance support: Bilateral upper extremity supported, Feet supported Sitting balance-Leahy Scale: Poor Sitting balance - Comments: Pt sat EOB with min-modA to maintain upright posture. She had a tendency to lean right to decrease pain and posterior d/t lethargy and fatigue. Postural control: Right lateral lean, Posterior lean  Communication Communication Communication: No apparent difficulties  Cognition Arousal: Lethargic Behavior During Therapy: WFL for tasks assessed/performed   PT - Cognitive impairments: Awareness, Attention, Initiation, Sequencing, Problem solving, Safety/Judgement                       PT - Cognition Comments: Pt tired varying between eyes closed and open during session. Alertness improved slightly upon sitting up. Following commands:  Impaired Following commands impaired: Only follows one step commands consistently, Follows multi-step commands inconsistently, Follows multi-step commands with increased time    Cueing Cueing Techniques: Verbal cues, Gestural cues, Visual cues  Exercises Amputee Exercises Quad Sets: Left, AROM, 5 reps, Supine Hip ABduction/ADduction: Left, 5 reps, AAROM, Supine Knee Extension: Seated, Left, AROM, 5 reps Straight Leg Raises: Left, 5 reps, AAROM, Supine Other Exercises Other Exercises: Educated pt/family on L BKA HEP. Provided pt with [employer]Bridge handout (Access Code: [id number])    General Comments General comments (skin integrity, edema, etc.): Pt's husband and daughter present and supportive throughout session.      Pertinent Vitals/Pain Pain Assessment Pain Assessment: 0-10 Pain Score: 9  Pain Location: LLE Pain Descriptors / Indicators: Operative site guarding, Guarding, Discomfort, Aching, Sore Pain Intervention(s): Monitored during session, Limited activity within patient's tolerance, Repositioned, Patient requesting pain meds-RN notified    Home Living                          Prior Function            PT Goals (current goals can now be found in the care plan section) Acute Rehab PT Goals Patient Stated Goal: Return Home Progress towards PT goals: Progressing toward goals    Frequency    Min 2X/week      PT Plan      Co-evaluation              AM-PAC PT 6 Clicks Mobility   Outcome Measure  Help needed turning from your back to your side while in a flat bed without using bedrails?: Total Help needed moving from lying on your back to sitting on the side of a flat bed without using bedrails?: Total Help needed moving to and from a bed to a chair (including a wheelchair)?: Total Help needed standing up from a chair using your arms (e.g., wheelchair or bedside chair)?: Total Help needed to walk in hospital room?: Total Help needed climbing 3-5  steps with a railing? : Total 6 Click Score: 6    End of Session   Activity Tolerance: Patient limited by lethargy;Patient limited by pain;Patient limited by fatigue Patient left: in bed;with call bell/phone within reach;with bed alarm set;with family/visitor present Nurse Communication: Mobility status;Need for lift equipment;Patient requests pain meds (stedy) PT Visit Diagnosis: Pain;Difficulty in walking, not elsewhere classified (R26.2);Unsteadiness on feet (R26.81);Other abnormalities of gait and mobility (R26.89) Pain - Right/Left: Left Pain - part of body: Knee;Leg     Time: 8495-8469 PT Time Calculation (min) (ACUTE ONLY): 26 min  Charges:    $Therapeutic Exercise: 8-22 mins $Therapeutic Activity: 8-22 mins PT General Charges $$ ACUTE PT VISIT: 1 Visit                     Randall SAUNDERS, PT, DPT Acute Rehabilitation Services Office: [phone] Secure Chat Preferred  [name] 07/14/2024, 4:02 PM

## 2024-07-14 NOTE — [place] FL2 (Signed)
 [place]  MEDICAID FL2 LEVEL OF CARE FORM     IDENTIFICATION  Patient Name: [name] Birthdate: 10-Jun-1946 Sex: female Admission Date (Current Location): 07/12/2024  [facility] and [place] Number:  Producer, television/film/video and Address:  [facility] [place], [address], [place], [place] [address]      Provider Number: [id number]  Attending Physician Name and Address:  Caleen Burgess BROCKS, MD  Relative Name and Phone Number:  Enrico Iha Daughter [phone]    Current Level of Care: Hospital Recommended Level of Care: Skilled Nursing Facility Prior Approval Number:    Date Approved/Denied:   PASRR Number: [id number]  Discharge Plan: SNF    Current Diagnoses: Patient Active Problem List   Diagnosis Date Noted   Heel ulcer, left, with unspecified severity (HCC) 07/12/2024   Femur fracture, left (HCC) 05/26/2024   Fall at home, initial encounter 05/26/2024   Cellulitis of both lower extremities 05/26/2024   Multiple drug resistant organism (MDRO) culture positive 10/19/2023   PUD (peptic ulcer disease) 10/15/2023   Coffee ground emesis 10/14/2023   Upper abdominal pain, unspecified 10/14/2023   Acute UTI 10/14/2023   Gastrointestinal hemorrhage 10/13/2023   Upper GI bleed 10/12/2023   Opioid dependence (HCC) 09/12/2023   Chronic diastolic CHF (congestive heart failure) (HCC) 09/10/2023   Pain in joint of right knee 09/01/2023   Parastomal hernia without obstruction or gangrene 04/27/2023   Colostomy complication (HCC) 04/27/2023   Ear stuffiness, right 11/24/2022   PAD (peripheral artery disease) (HCC) 11/30/2021   Obesity (BMI 30-39.9) 11/30/2021   Acute respiratory failure with hypoxia (HCC) 11/29/2021   Sepsis due to pneumonia (HCC) 11/29/2021   Severe protein-calorie malnutrition (HCC) 11/29/2021   Acute respiratory disease due to COVID-19 virus 11/29/2021   Duodenal ulcer    Acute gastric ulcer with hemorrhage    Abnormal CT of the  abdomen    Malnutrition of moderate degree 10/09/2021   Sepsis (HCC) 10/08/2021   Symptomatic anemia 09/23/2021   Pressure injury of skin 08/26/2021   Sepsis secondary to UTI (HCC) 08/25/2021   Goals of care, counseling/discussion 07/01/2021   Coronary artery disease 03/13/2021   Cellulitis of right lower limb 02/22/2021   Malignant neoplasm of overlapping sites of cervix (HCC) 02/18/2021   Rectal cancer (HCC) 01/29/2021   Rectal mass    Hydronephrosis    Iron deficiency anemia due to chronic blood loss    Non-intractable vomiting    Lower abdominal pain    UTI (urinary tract infection) 01/18/2021   Peripheral nerve disease 09/07/2020   Vitamin D  deficiency 09/07/2020   Carotid artery stenosis, symptomatic, right 03/28/2020   Preoperative clearance 03/21/2020   History of COVID-19 02/22/2020   Carotid artery disease (HCC) 02/22/2020   History of CVA (cerebrovascular accident) 02/11/2020   Macrocytic anemia 11/29/2019   Hypokalemia 07/27/2019   Hypomagnesemia 07/27/2019   History of cervical cancer 03/18/2019   DMII (diabetes mellitus, type 2) (HCC) 05/04/2018   Essential hypertension 05/04/2018   Bilateral lower extremity edema 07/31/2017   Hypothyroidism 06/06/2010   Hyperlipidemia associated with type 2 diabetes mellitus (HCC) 10/20/2008    Orientation RESPIRATION BLADDER Height & Weight     Self, Time, Situation, Place  Normal External catheter, Incontinent Weight: 145 lb 11.6 oz (66.1 kg) Height:  5' (152.4 cm)  BEHAVIORAL SYMPTOMS/MOOD NEUROLOGICAL BOWEL NUTRITION STATUS        Diet (see discharge summary)  AMBULATORY STATUS COMMUNICATION OF NEEDS Skin   Total Care Verbally Surgical wounds,  Other (Comment) (redness)                       Personal Care Assistance Level of Assistance  Bathing, Feeding, Dressing Bathing Assistance: Maximum assistance Feeding assistance: Limited assistance Dressing Assistance: Maximum assistance     Functional Limitations  Info  Sight, Hearing, Speech Sight Info: Adequate Hearing Info: Adequate Speech Info: Adequate    SPECIAL CARE FACTORS FREQUENCY  PT (By licensed PT), OT (By licensed OT)     PT Frequency: 5x week OT Frequency: 5x week            Contractures Contractures Info: Not present    Additional Factors Info  Code Status, Allergies, Insulin  Sliding Scale Code Status Info: full Allergies Info: codeine   Insulin  Sliding Scale Info: Novolog : see discharge summary       Current Medications (07/14/2024):  This is the current hospital active medication list Current Facility-Administered Medications  Medication Dose Route Frequency Provider Last Rate Last Admin   acetaminophen  (TYLENOL ) tablet 325-650 mg  325-650 mg Oral Q6H PRN Schuh, McKenzi P, PA-C   650 mg at 07/14/24 0354   ALPRAZolam  (XANAX ) tablet 0.25 mg  0.25 mg Oral Q8H PRN Schuh, McKenzi P, PA-C       atorvastatin  (LIPITOR) tablet 40 mg  40 mg Oral QHS Schuh, McKenzi P, PA-C   40 mg at 07/13/24 2035   ceFEPIme  (MAXIPIME ) 2 g in sodium chloride  0.9 % 100 mL IVPB  2 g Intravenous Q12H Marten Larraine HERO, [facility]   Stopped at 07/14/24 9490   Chlorhexidine  Gluconate Cloth 2 % PADS 6 each  6 each Topical Once Schuh, McKenzi P, PA-C       gabapentin  (NEURONTIN ) capsule 300 mg  300 mg Oral TID Amin, Ankit C, MD   300 mg at 07/14/24 1022   glucagon  (human recombinant) (GLUCAGEN) injection 1 mg  1 mg Intravenous PRN Amin, Ankit C, MD       guaiFENesin  (ROBITUSSIN) 100 MG/5ML liquid 5 mL  5 mL Oral Q4H PRN Amin, Ankit C, MD       heparin  injection 5,000 Units  5,000 Units Subcutaneous Q8H Schuh, McKenzi P, PA-C   5,000 Units at 07/14/24 0608   heparin  lock flush 100 unit/mL  500 Units Intracatheter Prior to discharge Tobie Mario GAILS, MD       hydrALAZINE  (APRESOLINE ) injection 10 mg  10 mg Intravenous Q4H PRN Amin, Ankit C, MD       HYDROmorphone  (DILAUDID ) injection 0.5 mg  0.5 mg Intravenous Q2H PRN Schuh, McKenzi P, PA-C   0.5 mg at 07/14/24  1148   insulin  aspart (novoLOG ) injection 0-15 Units  0-15 Units Subcutaneous TID WC Daniels, James K, NP   2 Units at 07/14/24 1141   insulin  aspart (novoLOG ) injection 0-5 Units  0-5 Units Subcutaneous QHS Daniels, James K, NP       insulin  glargine (LANTUS ) injection 10 Units  10 Units Subcutaneous Daily Amin, Ankit C, MD   10 Units at 07/14/24 1002   ipratropium-albuterol  (DUONEB) 0.5-2.5 (3) MG/3ML nebulizer solution 3 mL  3 mL Nebulization Q4H PRN Amin, Ankit C, MD       levothyroxine  (SYNTHROID ) tablet 150 mcg  150 mcg Oral Q0600 Schuh, McKenzi P, PA-C   150 mcg at 07/14/24 0609   metoprolol  tartrate (LOPRESSOR ) injection 5 mg  5 mg Intravenous Q4H PRN Amin, Ankit C, MD       ondansetron  (ZOFRAN ) injection 4 mg  4  mg Intravenous Q6H PRN Amin, Ankit C, MD       oxyCODONE  (OXYCONTIN ) 12 hr tablet 10 mg  10 mg Oral Q12H Amin, Ankit C, MD   10 mg at 07/14/24 1022   pantoprazole  (PROTONIX ) injection 40 mg  40 mg Intravenous Q12H Schuh, McKenzi P, PA-C   40 mg at 07/14/24 1023   sertraline  (ZOLOFT ) tablet 150 mg  150 mg Oral QHS Schuh, McKenzi P, PA-C   150 mg at 07/13/24 2035   sodium chloride  flush (NS) 0.9 % injection 10-40 mL  10-40 mL Intracatheter Q12H Tobie Mario GAILS, MD   10 mL at 07/14/24 1023   sodium chloride  flush (NS) 0.9 % injection 10-40 mL  10-40 mL Intracatheter PRN Tobie Mario GAILS, MD       traMADol  (ULTRAM ) tablet 50 mg  50 mg Oral Q6H PRN Schuh, McKenzi P, PA-C   50 mg at 07/13/24 1747   traZODone  (DESYREL ) tablet 50 mg  50 mg Oral QHS PRN Amin, Ankit C, MD   50 mg at 07/13/24 2108   vancomycin  (VANCOREADY) IVPB 750 mg/150 mL  750 mg Intravenous Q24H Marten Larraine HERO, RPH 150 mL/hr at 07/13/24 1823 750 mg at 07/13/24 1823   Facility-Administered Medications Ordered in Other Encounters  Medication Dose Route Frequency Provider Last Rate Last Admin   sodium chloride  flush (NS) 0.9 % injection 10 mL  10 mL Intravenous PRN Cloretta Arley NOVAK, MD   10 mL at 03/30/24 1158      Discharge Medications: Please see discharge summary for a list of discharge medications.  Relevant Imaging Results:  Relevant Lab Results:   Additional Information SSN [id number]  Bridget Cordella Simmonds, LCSW

## 2024-07-14 NOTE — TOC Initial Note (Addendum)
 Transition of Care [facility]) - Initial/Assessment Note    Patient Details  Name: [name] MRN: [id number] Date of Birth: [birth year]  Transition of Care [facility]) CM/SW Contact:    Bridget Cordella Simmonds, LCSW Phone Number: 07/14/2024, 2:55 PM  Clinical Narrative:     CSW met with pt and husband Carlin regarding PT recommendation for SNF.  Pt from home with Carlin, but has been in STR at [facility], returned for this procedure.  Normally lives with husband, daughter [name] lives nearby.  Pt and husband do want pt tor return to [facility] at discharge.  Permission given to speak with all family members.  CSW confirmed with Tracy/[facility] that pt can return with new approved auth.    CMA Angela to start aetna auth.           Expected Discharge Plan: Skilled Nursing Facility Barriers to Discharge: Continued Medical Work up   Patient Goals and CMS Choice Patient states their goals for this hospitalization and ongoing recovery are:: walking   Choice offered to / list presented to : Patient (return to [facility])      Expected Discharge Plan and Services In-house Referral: Clinical Social Work   Post Acute Care Choice: Skilled Nursing Facility Living arrangements for the past 2 months: Single Family Home                                      Prior Living Arrangements/Services Living arrangements for the past 2 months: Single Family Home Lives with:: Spouse Patient language and need for interpreter reviewed:: Yes Do you feel safe going back to the place where you live?: Yes      Need for Family Participation in Patient Care: Yes (Comment) Care giver support system in place?: Yes (comment) Current home services: Other (comment) (na) Criminal Activity/Legal Involvement Pertinent to Current Situation/Hospitalization: No - Comment as needed  Activities of Daily Living   ADL Screening (condition at time of admission) Independently performs ADLs?: No Does the patient have a NEW  difficulty with bathing/dressing/toileting/self-feeding that is expected to last >3 days?: Yes (Initiates electronic notice to provider for possible OT consult) Does the patient have a NEW difficulty with getting in/out of bed, walking, or climbing stairs that is expected to last >3 days?: Yes (Initiates electronic notice to provider for possible PT consult) Does the patient have a NEW difficulty with communication that is expected to last >3 days?: No Is the patient deaf or have difficulty hearing?: No Does the patient have difficulty seeing, even when wearing glasses/contacts?: No Does the patient have difficulty concentrating, remembering, or making decisions?: No  Permission Sought/Granted Permission sought to share information with : Family Supports Permission granted to share information with : Yes, Verbal Permission Granted  Share Information with NAME: daughtr [name], husband Carlin, son Comptroller granted to share info w AGENCY: [facility]        Emotional Assessment Appearance:: Appears stated age Attitude/Demeanor/Rapport: Engaged, Lethargic Affect (typically observed): Pleasant Orientation: : Oriented to Self, Oriented to Place, Oriented to  Time, Oriented to Situation      Admission diagnosis:  Atherosclerosis of artery of extremity with gangrene (HCC) [I70.269] Heel ulcer, left, with unspecified severity [facility]) [L97.429] Patient Active Problem List   Diagnosis Date Noted   Heel ulcer, left, with unspecified severity (HCC) 07/12/2024   Femur fracture, left (HCC) 05/26/2024   Fall at home, initial encounter  05/26/2024   Cellulitis of both lower extremities 05/26/2024   Multiple drug resistant organism (MDRO) culture positive 10/19/2023   PUD (peptic ulcer disease) 10/15/2023   Coffee ground emesis 10/14/2023   Upper abdominal pain, unspecified 10/14/2023   Acute UTI 10/14/2023   Gastrointestinal hemorrhage 10/13/2023   Upper GI bleed 10/12/2023   Opioid dependence  (HCC) 09/12/2023   Chronic diastolic CHF (congestive heart failure) (HCC) 09/10/2023   Pain in joint of right knee 09/01/2023   Parastomal hernia without obstruction or gangrene 04/27/2023   Colostomy complication (HCC) 04/27/2023   Ear stuffiness, right 11/24/2022   PAD (peripheral artery disease) (HCC) 11/30/2021   Obesity (BMI 30-39.9) 11/30/2021   Acute respiratory failure with hypoxia (HCC) 11/29/2021   Sepsis due to pneumonia (HCC) 11/29/2021   Severe protein-calorie malnutrition (HCC) 11/29/2021   Acute respiratory disease due to COVID-19 virus 11/29/2021   Duodenal ulcer    Acute gastric ulcer with hemorrhage    Abnormal CT of the abdomen    Malnutrition of moderate degree 10/09/2021   Sepsis (HCC) 10/08/2021   Symptomatic anemia 09/23/2021   Pressure injury of skin 08/26/2021   Sepsis secondary to UTI (HCC) 08/25/2021   Goals of care, counseling/discussion 07/01/2021   Coronary artery disease 03/13/2021   Cellulitis of right lower limb 02/22/2021   Malignant neoplasm of overlapping sites of cervix (HCC) 02/18/2021   Rectal cancer (HCC) 01/29/2021   Rectal mass    Hydronephrosis    Iron deficiency anemia due to chronic blood loss    Non-intractable vomiting    Lower abdominal pain    UTI (urinary tract infection) 01/18/2021   Peripheral nerve disease 09/07/2020   Vitamin D  deficiency 09/07/2020   Carotid artery stenosis, symptomatic, right 03/28/2020   Preoperative clearance 03/21/2020   History of COVID-19 02/22/2020   Carotid artery disease (HCC) 02/22/2020   History of CVA (cerebrovascular accident) 02/11/2020   Macrocytic anemia 11/29/2019   Hypokalemia 07/27/2019   Hypomagnesemia 07/27/2019   History of cervical cancer 03/18/2019   DMII (diabetes mellitus, type 2) (HCC) 05/04/2018   Essential hypertension 05/04/2018   Bilateral lower extremity edema 07/31/2017   Hypothyroidism 06/06/2010   Hyperlipidemia associated with type 2 diabetes mellitus (HCC)  10/20/2008   PCP:  Waylan Almarie SAUNDERS, MD Pharmacy:   [facility] [address] [place] - [address] [place] [address] Phone: [phone] Fax: [phone]     Social Drivers of Health (SDOH) Social History: SDOH Screenings   Food Insecurity: No Food Insecurity (07/12/2024)  Housing: Low Risk  (07/12/2024)  Transportation Needs: No Transportation Needs (07/12/2024)  Utilities: Not At Risk (07/12/2024)  Depression (PHQ2-9): Low Risk  (06/23/2024)  Social Connections: Unknown (07/12/2024)  Tobacco Use: Medium Risk (07/12/2024)   SDOH Interventions:     Readmission Risk Interventions    10/15/2023    9:49 AM 09/11/2023   11:36 AM 12/02/2021    1:55 PM  Readmission Risk Prevention Plan  Transportation Screening Complete Complete Complete  PCP or Specialist Appt within 3-5 Days  Complete   HRI or Home Care Consult  Complete   Social Work Consult for Recovery Care Planning/Counseling  Complete   Palliative Care Screening  Not Applicable   Medication Review Oceanographer) Referral to Pharmacy Complete Complete  PCP or Specialist appointment within 3-5 days of discharge Complete  Complete  HRI or Home Care Consult Complete  Complete  SW Recovery Care/Counseling Consult Complete  Complete  Palliative Care Screening Not Applicable  Not Applicable  Skilled Nursing Facility Not Applicable  Patient Refused

## 2024-07-14 NOTE — Progress Notes (Signed)
 PROGRESS NOTE    [name]  FMW:[id number] DOB: 02/22/1946 DOA: 07/12/2024 PCP: Waylan Almarie SAUNDERS, MD    Brief Narrative:   78 y.o. female with past medical history  of   T2DM, HLD, HTN, cervical and rectal cancer s/p colostomy, diabetic foot ulcer, neuropathy, hypothyroidism, hypomagnesemia, anemia and arthritis,   PAD left lower extremity angiogram with left common iliac artery stenting on 05/30/2024 by Dr. Sheree for critical limb ischemia for left heel ulceration , seen in clinic at vascular for left heel ulcer and foul smelling drainage, and pt was advised for left BKA.Pt was scheduled for Necrotic left heel ulcer bka.  Since her recent admission and discharge in July for same pt's ulcer had not healed, rather gotten worse.  Pt seen in PACU and no HPI. Very somnolent and sedated ahs not recovered from anesthesia.   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 -Started home OxyContin  and gabapentin  for better pain control. -On vancomycin  and cefepime   Mechanical fall with recent left femur fracture -Patient had a fall requiring ORIF in July 2025.  Recovering as expected.  Continue to closely monitor  Diabetes mellitus type 2, uncontrolled due to hyperglycemia -Sliding scale and Accu-Chek.  Holding home metformin  - Will start Semglee   Hypomagnesemia -Aggressive repletion.  Check calcium , phosphorus  Essential hypertension -IV as needed  History of cervical/rectal cancer status post colectomy -Follows outpatient oncology team  Anemia of chronic disease - Baseline hemoglobin around 10.0, postop drifted down.  1 unit PRBC transfusion today  Hyperlipidemia -Lipitor  Hypothyroidism -Synthroid   GERD -PPI  Mood disorder -Zoloft   TOC consulted for SNF placement  DVT prophylaxis: Subcu heparin     Code Status:  Full Code Family Communication: Son and husband at bedside Status is: Inpatient Remains inpatient appropriate because: Hopefully discharge tomorrow   PT Follow up Recs: Skilled Nursing-Short Term Rehab (<3 Hours/Day)07/13/2024 1200  Subjective:  Low-grade temperature overnight.  Tolerated PRBC transfusion this morning Husband and son at bedside.  Examination:  General exam: Appears calm and comfortable  Respiratory system: Clear to auscultation. Respiratory effort normal. Cardiovascular system: S1 & S2 heard, RRR. No JVD, murmurs, rubs, gallops or clicks. No pedal edema. Gastrointestinal system: Abdomen is nondistended, soft and nontender. No organomegaly or masses felt. Normal bowel sounds heard. Central nervous system: Alert and oriented to her name. No focal neurological deficits. Extremities: Left BKA noted Skin: No rashes, lesions or ulcers Psychiatry: Judgement and insight appear poor                Diet Orders (From admission, onward)     Start     Ordered   07/12/24 1532  Diet Carb Modified Fluid consistency: Thin; Room service appropriate? Yes  Diet effective now       Question Answer Comment  Diet-HS Snack? Nothing   Calorie Level Medium 1600-2000   Fluid consistency: Thin   Room service appropriate? Yes      07/12/24 1532            Objective: Vitals:   07/14/24 0602 07/14/24 0640 07/14/24 0832 07/14/24 0930  BP: (!) 127/38 (!) 131/49 (!) 116/36 114/84  Pulse: 67 67 63 72  Resp: 18 18  16   Temp: 98.8 F (37.1 C) 99 F (37.2 C) 98.3 F (36.8 C) 99 F (37.2 C)  TempSrc: Oral Oral  Oral  SpO2: 98% 98% 100% (P) 98%  Weight:      Height:        Intake/Output Summary (Last 24 hours) at 07/14/2024 1039 Last data filed at 07/14/2024 0930 Gross per 24 hour  Intake 692.5 ml  Output 600 ml  Net 92.5 ml   Filed Weights   07/11/24 1005 07/12/24 0900 07/13/24 0414  Weight: 71.7 kg 71.7 kg 66.1 kg    Scheduled Meds:  atorvastatin   40 mg  Oral QHS   Chlorhexidine  Gluconate Cloth  6 each Topical Once   gabapentin   300 mg Oral TID   heparin   5,000 Units Subcutaneous Q8H   insulin  aspart  0-15 Units Subcutaneous TID WC   insulin  aspart  0-5 Units Subcutaneous QHS   insulin  glargine  10 Units Subcutaneous Daily   levothyroxine   150 mcg Oral Q0600   oxyCODONE   10 mg Oral Q12H   pantoprazole  (PROTONIX ) IV  40 mg Intravenous Q12H   sertraline   150 mg Oral QHS   sodium chloride  flush  10-40 mL Intracatheter Q12H   Continuous Infusions:  ceFEPime  (MAXIPIME ) IV 2 g (07/14/24 0430)   magnesium  sulfate bolus IVPB 4 g (07/14/24 1026)   vancomycin  750 mg (07/13/24 1823)    Nutritional status     Body mass index is 28.46 kg/m.  Data Reviewed:   CBC: Recent Labs  Lab 07/12/24 0919 07/12/24 1828 07/12/24 1952 07/13/24 0242 07/14/24 0015  WBC 9.0 7.0  --  10.3 7.3  HGB 11.3* 7.6* 8.2* 7.5* 6.5*  HCT 39.4 25.8* 28.0* 25.7* 22.6*  MCV 86.0 83.2  --  84.3 84.3  PLT 235 189  --  218 189   Basic Metabolic Panel: Recent Labs  Lab 07/12/24 0919 07/12/24 1828 07/13/24 0242 07/14/24 0015  NA 138  --  138 138  K 4.0  --  3.8 3.7  CL 104  --  105 105  CO2 21*  --  23 23  GLUCOSE 128*  --  168* 176*  BUN 21  --  21 17  CREATININE 0.81 0.75 0.77 0.72  CALCIUM  9.3  --  8.1* 8.0*  MG  --  0.9* 2.1 1.2*  PHOS  --   --  3.7 3.0   GFR: Estimated Creatinine Clearance: 49.1 mL/min (by C-G formula based on SCr of 0.72 mg/dL). Liver Function Tests: Recent Labs  Lab 07/12/24 0919 07/13/24 0242  AST 25 21  ALT 21 15  ALKPHOS 126 96  BILITOT 0.5 0.6  PROT 7.0 5.3*  ALBUMIN 2.6* 2.0*   No results for input(s): LIPASE, AMYLASE in the last 168 hours. No results for input(s): AMMONIA in the last 168 hours. Coagulation Profile: Recent Labs  Lab 07/12/24 0919  INR 1.2   Cardiac Enzymes: No results for input(s): CKTOTAL, CKMB, CKMBINDEX, TROPONINI in the last 168 hours. BNP (last 3 results) No results  for input(s): PROBNP in the last 8760 hours. HbA1C: Recent Labs    07/12/24 1828  HGBA1C 6.0*   CBG: Recent Labs  Lab 07/13/24 0624 07/13/24 1125 07/13/24 1524 07/13/24 2047 07/14/24 0626  GLUCAP 133* 275* 225* 148* 281*   Lipid Profile: No results for input(s): CHOL, HDL, LDLCALC, TRIG, CHOLHDL, LDLDIRECT in the last 72 hours. Thyroid  Function Tests: No results for input(s): TSH, T4TOTAL, FREET4, T3FREE, THYROIDAB in the last 72 hours. Anemia Panel: No results for input(s): VITAMINB12, FOLATE, FERRITIN, TIBC, IRON, RETICCTPCT in the last 72 hours. Sepsis Labs: Recent Labs  Lab 07/12/24 1828  LATICACIDVEN  0.7    Recent Results (from the past 240 hours)  Surgical pcr screen     Status: None   Collection Time: 07/12/24  9:25 AM   Specimen: Nasal Swab  Result Value Ref Range Status   MRSA, PCR NEGATIVE NEGATIVE Final   Staphylococcus aureus NEGATIVE NEGATIVE Final    Comment: (NOTE) The Xpert SA Assay (FDA approved for NASAL specimens in patients 34 years of age and older), is one component of a comprehensive surveillance program. It is not intended to diagnose infection nor to guide or monitor treatment. Performed at [facility], [address] [place], [place] [address]          Radiology Studies: No results found.         LOS: 2 days   Time spent= 35 mins    Burgess JAYSON Dare, MD [facility]  If 7PM-7AM, please contact night-coverage  07/14/2024, 10:39 AM

## 2024-07-14 NOTE — Progress Notes (Signed)
     Patient Name: [name]           DOB: 1946/01/25  MRN: [id number]      Admission Date: 07/12/2024  Attending Provider: Caleen Burgess BROCKS, MD  Primary Diagnosis: Heel ulcer, left, with unspecified severity (HCC)   Level of care: Telemetry Medical   OVERNIGHT EVENT   Events of Note [name], 78 y.o. female, was admitted on 07/12/2024 for Heel ulcer, left, with unspecified severity (HCC).   Acute Blood Loss Anemia Anemia of Chronic Disease  Hemoglobin 7.5 --> 6.5 No acute changes reported.  Hemodynamically stable. No melena, hematochezia, or other bleeding reported tonight.  Status post left BKA (8/26)  Plan: Blood transfusion, 1 unit PRBC Recheck H&H after transfusion is completed, transfuse if HGB <7   Lavanda Horns, DNP, ACNPC- AG [facility] [place]

## 2024-07-14 NOTE — Progress Notes (Signed)
  Progress Note    07/14/2024 7:43 AM 2 Days Post-Op  Subjective: She is having continuous pain in the left amputation site  Vitals:   07/14/24 0602 07/14/24 0640  BP: (!) 127/38 (!) 131/49  Pulse: 67 67  Resp: 18 18  Temp: 98.8 F (37.1 C) 99 F (37.2 C)  SpO2: 98% 98%    Physical Exam: Awake and alert   CBC    Component Value Date/Time   WBC 7.3 07/14/2024 0015   RBC 2.68 (L) 07/14/2024 0015   HGB 6.5 (LL) 07/14/2024 0015   HGB 9.3 (L) 06/23/2024 1105   HCT 22.6 (L) 07/14/2024 0015   PLT 189 07/14/2024 0015   PLT 212 06/23/2024 1105   MCV 84.3 07/14/2024 0015   MCH 24.3 (L) 07/14/2024 0015   MCHC 28.8 (L) 07/14/2024 0015   RDW 17.4 (H) 07/14/2024 0015   LYMPHSABS 0.9 06/23/2024 1105   MONOABS 0.7 06/23/2024 1105   EOSABS 0.0 06/23/2024 1105   BASOSABS 0.0 06/23/2024 1105    BMET    Component Value Date/Time   NA 138 07/14/2024 0015   NA 144 08/21/2017 1553   K 3.7 07/14/2024 0015   CL 105 07/14/2024 0015   CO2 23 07/14/2024 0015   GLUCOSE 176 (H) 07/14/2024 0015   BUN 17 07/14/2024 0015   BUN 21 08/21/2017 1553   CREATININE 0.72 07/14/2024 0015   CREATININE 0.74 06/23/2024 1105   CALCIUM  8.0 (L) 07/14/2024 0015   CALCIUM  9.6 09/19/2010 2220   GFRNONAA >60 07/14/2024 0015   GFRNONAA >60 06/23/2024 1105   GFRAA >60 03/29/2020 0314    INR    Component Value Date/Time   INR 1.2 07/12/2024 0919     Intake/Output Summary (Last 24 hours) at 07/14/2024 0743 Last data filed at 07/14/2024 0634 Gross per 24 hour  Intake 10 ml  Output 600 ml  Net -590 ml     Assessment:  78 y.o. female is s/p left below-knee amputation now healing well  Plan: Dressing can remain off for a couple hours she will then need shrinker sock replaced  Patient is okay for discharge from a vascular standpoint and I will schedule follow-up for her in a few weeks for staple removal    Bernhard Koskinen C. Sheree, MD [facility] Office:  [phone] Pager: [phone]  07/14/2024 7:43 AM  I have independently interviewed and examined patient and agree with PA assessment and plan above.   Maxime Beckner C. Sheree, MD [facility] [place] Office: [phone] Pager: [phone]

## 2024-07-15 DIAGNOSIS — L97429 Non-pressure chronic ulcer of left heel and midfoot with unspecified severity: Secondary | ICD-10-CM | POA: Diagnosis not present

## 2024-07-15 LAB — TYPE AND SCREEN
ABO/RH(D): O POS
Antibody Screen: NEGATIVE
Unit division: 0

## 2024-07-15 LAB — BPAM RBC
Blood Product Expiration Date: 202509162359
ISSUE DATE / TIME: 202508280615
Unit Type and Rh: 5100

## 2024-07-15 LAB — BASIC METABOLIC PANEL WITH GFR
Anion gap: 11 (ref 5–15)
BUN: 13 mg/dL (ref 8–23)
CO2: 23 mmol/L (ref 22–32)
Calcium: 7.9 mg/dL — ABNORMAL LOW (ref 8.9–10.3)
Chloride: 103 mmol/L (ref 98–111)
Creatinine, Ser: 0.73 mg/dL (ref 0.44–1.00)
GFR, Estimated: >60 mL/min (ref >60)
Glucose, Bld: 161 mg/dL — ABNORMAL HIGH (ref 70–99)
Potassium: 3.3 mmol/L — ABNORMAL LOW (ref 3.5–5.1)
Sodium: 137 mmol/L (ref 135–145)

## 2024-07-15 LAB — CALCIUM, IONIZED: Calcium, Ionized, Serum: 4.6 mg/dL (ref 4.5–5.6)

## 2024-07-15 LAB — GLUCOSE, CAPILLARY
Glucose-Capillary: 166 mg/dL — ABNORMAL HIGH (ref 70–99)
Glucose-Capillary: 215 mg/dL — ABNORMAL HIGH (ref 70–99)
Glucose-Capillary: 141 mg/dL — ABNORMAL HIGH (ref 70–99)
Glucose-Capillary: 190 mg/dL — ABNORMAL HIGH (ref 70–99)

## 2024-07-15 LAB — CBC
HCT: 25.1 % — ABNORMAL LOW (ref 36.0–46.0)
Hemoglobin: 7.8 g/dL — ABNORMAL LOW (ref 12.0–15.0)
MCH: 25.9 pg — ABNORMAL LOW (ref 26.0–34.0)
MCHC: 31.1 g/dL (ref 30.0–36.0)
MCV: 83.4 fL (ref 80.0–100.0)
Platelets: 163 K/uL (ref 150–400)
RBC: 3.01 MIL/uL — ABNORMAL LOW (ref 3.87–5.11)
RDW: 17.2 % — ABNORMAL HIGH (ref 11.5–15.5)
WBC: 6.2 K/uL (ref 4.0–10.5)
nRBC: 0 % (ref 0.0–0.2)

## 2024-07-15 LAB — MAGNESIUM: Magnesium: 1.7 mg/dL (ref 1.7–2.4)

## 2024-07-15 MED ORDER — POTASSIUM CHLORIDE CRYS ER 20 MEQ PO TBCR
30.0000 meq | EXTENDED_RELEASE_TABLET | ORAL | Status: AC
  Administered 2024-07-15 (×2): 30 meq via ORAL
  Filled 2024-07-15 (×2): qty 1

## 2024-07-15 MED ORDER — MAGNESIUM OXIDE -MG SUPPLEMENT 400 (240 MG) MG PO TABS
800.0000 mg | ORAL_TABLET | Freq: Once | ORAL | Status: AC
  Administered 2024-07-15: 800 mg via ORAL
  Filled 2024-07-15: qty 2

## 2024-07-15 MED ORDER — ALPRAZOLAM 0.25 MG PO TABS: 0.2500 mg | ORAL_TABLET | Freq: Three times a day (TID) | ORAL | 0 refills | Status: DC | PRN

## 2024-07-15 MED ORDER — OXYCODONE HCL ER 10 MG PO T12A: 10.0000 mg | EXTENDED_RELEASE_TABLET | Freq: Two times a day (BID) | ORAL | 0 refills | Status: DC

## 2024-07-15 MED ORDER — TRAMADOL HCL 50 MG PO TABS: 50.0000 mg | ORAL_TABLET | Freq: Three times a day (TID) | ORAL | 0 refills | Status: DC | PRN

## 2024-07-15 MED ORDER — OXYCODONE HCL 5 MG PO TABS: 2.5000 mg | ORAL_TABLET | ORAL | 0 refills | Status: DC | PRN

## 2024-07-15 MED ORDER — CLOPIDOGREL BISULFATE 75 MG PO TABS
75.0000 mg | ORAL_TABLET | Freq: Every day | ORAL | Status: DC
Start: 2024-07-15 — End: 2024-07-27
  Administered 2024-07-15 – 2024-07-27 (×13): 75 mg via ORAL
  Filled 2024-07-15 (×13): qty 1

## 2024-07-15 NOTE — Progress Notes (Signed)
 PROGRESS NOTE    [name]  FMW:[id number] DOB: 08-28-1946 DOA: 07/12/2024 PCP: Waylan Almarie SAUNDERS, MD    Brief Narrative:   78 y.o. female with past medical history  of   T2DM, HLD, HTN, cervical and rectal cancer s/p colostomy, diabetic foot ulcer, neuropathy, hypothyroidism, hypomagnesemia, anemia and arthritis,   PAD left lower extremity angiogram with left common iliac artery stenting on 05/30/2024 by Dr. Sheree for critical limb ischemia for left heel ulceration , seen in clinic at vascular for left heel ulcer and foul smelling drainage, and pt was advised for left BKA.Pt was scheduled for Necrotic left heel ulcer bka.  Since her recent admission and discharge in July for same pt's ulcer had not healed, rather gotten worse.  Pt seen in PACU and no HPI. Very somnolent and sedated ahs not recovered from anesthesia.   Upon admission she slowly improved.  She was eventually cleared by vascular surgery for discharge, home Plavix  was resumed.  PT/OT recommended SNF.  Due to some drop in hemoglobin required 1 unit PRBC transfusion on 8/28.  Family updated during the hospitalization.  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 -Pain control and bowel regimen.  Resume Plavix  and statin  Mechanical fall with recent left femur fracture -Patient had a fall requiring ORIF in July 2025.  Recovering as expected.  Continue to closely monitor  Diabetes mellitus type 2, uncontrolled due to hyperglycemia -Sliding scale and Accu-Chek. On semglee , resume metformin  upon dc  Hypomagnesemia/HypoKalemia -Aggressive repletion.    Essential hypertension -IV as needed  History of cervical/rectal cancer status post colectomy -Follows outpatient oncology team  Anemia of chronic disease/ - Baseline hemoglobin around 10.0, postop drifted down.   S/p 1U Prbc  8/28, now stable.   Hyperlipidemia -Lipitor  Hypothyroidism -Synthroid   GERD -PPI  Mood disorder -Zoloft   TOC consulted for SNF placement  DVT prophylaxis: Subcu heparin     Code Status: Full Code Family Communication: Son and husband at bedside Status is: Inpatient Remains inpatient appropriate because SNF placement   PT Follow up Recs: Skilled Nursing-Short Term Rehab (<3 Hours/Day)07/13/2024 1200  Subjective: Pain as expected no other complaints.    Examination:  General exam: Appears calm and comfortable  Respiratory system: Clear to auscultation. Respiratory effort normal. Cardiovascular system: S1 & S2 heard, RRR. No JVD, murmurs, rubs, gallops or clicks. No pedal edema. Gastrointestinal system: Abdomen is nondistended, soft and nontender. No organomegaly or masses felt. Normal bowel sounds heard. Central nervous system: Alert and oriented to her name. No focal neurological deficits. Extremities: Left BKA noted Skin: No rashes, lesions or ulcers Psychiatry: Judgement and insight appear poor                Diet Orders (From admission, onward)     Start     Ordered   07/12/24 1532  Diet Carb Modified Fluid consistency: Thin; Room service appropriate? Yes  Diet effective now       Question Answer Comment  Diet-HS Snack? Nothing   Calorie Level Medium 1600-2000   Fluid consistency: Thin   Room service appropriate? Yes      07/12/24 1532            Objective: Vitals:   07/14/24 1612 07/14/24 1940 07/15/24 0344 07/15/24 0849  BP: (!) 121/33 (!) 113/32 (!) 128/38 (!) 111/44  Pulse: 65 (!)  59 65 68  Resp:  18 16 17   Temp: 99.4 F (37.4 C) 98.7 F (37.1 C) 98.4 F (36.9 C) (!) 97.5 F (36.4 C)  TempSrc:   Oral Oral  SpO2: 100% 95% 95% 98%  Weight:      Height:        Intake/Output Summary (Last 24 hours) at 07/15/2024 1104 Last data filed at 07/15/2024 0541 Gross per 24 hour  Intake 701.54 ml  Output 950 ml  Net -248.46  ml   Filed Weights   07/11/24 1005 07/12/24 0900 07/13/24 0414  Weight: 71.7 kg 71.7 kg 66.1 kg    Scheduled Meds:  atorvastatin   40 mg Oral QHS   Chlorhexidine  Gluconate Cloth  6 each Topical Once   clopidogrel   75 mg Oral Daily   gabapentin   300 mg Oral TID   heparin   5,000 Units Subcutaneous Q8H   insulin  aspart  0-15 Units Subcutaneous TID WC   insulin  aspart  0-5 Units Subcutaneous QHS   insulin  glargine  10 Units Subcutaneous Daily   levothyroxine   150 mcg Oral Q0600   oxyCODONE   10 mg Oral Q12H   pantoprazole  (PROTONIX ) IV  40 mg Intravenous Q12H   potassium chloride   30 mEq Oral Q3H   sertraline   150 mg Oral QHS   sodium chloride  flush  10-40 mL Intracatheter Q12H   Continuous Infusions:  ceFEPime  (MAXIPIME ) IV 2 g (07/15/24 0526)   vancomycin  Stopped (07/14/24 1737)    Nutritional status     Body mass index is 28.46 kg/m.  Data Reviewed:   CBC: Recent Labs  Lab 07/12/24 0919 07/12/24 1828 07/12/24 1952 07/13/24 0242 07/14/24 0015 07/14/24 1924 07/15/24 0349  WBC 9.0 7.0  --  10.3 7.3  --  6.2  HGB 11.3* 7.6* 8.2* 7.5* 6.5* 7.9* 7.8*  HCT 39.4 25.8* 28.0* 25.7* 22.6* 25.0* 25.1*  MCV 86.0 83.2  --  84.3 84.3  --  83.4  PLT 235 189  --  218 189  --  163   Basic Metabolic Panel: Recent Labs  Lab 07/12/24 0919 07/12/24 1828 07/13/24 0242 07/14/24 0015 07/15/24 0349  NA 138  --  138 138 137  K 4.0  --  3.8 3.7 3.3*  CL 104  --  105 105 103  CO2 21*  --  23 23 23   GLUCOSE 128*  --  168* 176* 161*  BUN 21  --  21 17 13   CREATININE 0.81 0.75 0.77 0.72 0.73  CALCIUM  9.3  --  8.1* 8.0* 7.9*  MG  --  0.9* 2.1 1.2* 1.7  PHOS  --   --  3.7 3.0  --    GFR: Estimated Creatinine Clearance: 49.1 mL/min (by C-G formula based on SCr of 0.73 mg/dL). Liver Function Tests: Recent Labs  Lab 07/12/24 0919 07/13/24 0242  AST 25 21  ALT 21 15  ALKPHOS 126 96  BILITOT 0.5 0.6  PROT 7.0 5.3*  ALBUMIN 2.6* 2.0*   No results for input(s): LIPASE,  AMYLASE in the last 168 hours. No results for input(s): AMMONIA in the last 168 hours. Coagulation Profile: Recent Labs  Lab 07/12/24 0919  INR 1.2   Cardiac Enzymes: No results for input(s): CKTOTAL, CKMB, CKMBINDEX, TROPONINI in the last 168 hours. BNP (last 3 results) No results for input(s): PROBNP in the last 8760 hours. HbA1C: Recent Labs    07/12/24 1828  HGBA1C 6.0*   CBG: Recent Labs  Lab 07/14/24 1139 07/14/24 1625 07/14/24 2032  07/14/24 2230 07/15/24 0603  GLUCAP 189* 169* 225* 202* 166*   Lipid Profile: No results for input(s): CHOL, HDL, LDLCALC, TRIG, CHOLHDL, LDLDIRECT in the last 72 hours. Thyroid  Function Tests: No results for input(s): TSH, T4TOTAL, FREET4, T3FREE, THYROIDAB in the last 72 hours. Anemia Panel: No results for input(s): VITAMINB12, FOLATE, FERRITIN, TIBC, IRON, RETICCTPCT in the last 72 hours. Sepsis Labs: Recent Labs  Lab 07/12/24 1828  LATICACIDVEN 0.7    Recent Results (from the past 240 hours)  Surgical pcr screen     Status: None   Collection Time: 07/12/24  9:25 AM   Specimen: Nasal Swab  Result Value Ref Range Status   MRSA, PCR NEGATIVE NEGATIVE Final   Staphylococcus aureus NEGATIVE NEGATIVE Final    Comment: (NOTE) The Xpert SA Assay (FDA approved for NASAL specimens in patients 88 years of age and older), is one component of a comprehensive surveillance program. It is not intended to diagnose infection nor to guide or monitor treatment. Performed at [facility], [address] [place], [place] [address]          Radiology Studies: No results found.         LOS: 3 days   Time spent= 35 mins    Burgess JAYSON Dare, MD [facility]  If 7PM-7AM, please contact night-coverage  07/15/2024, 11:04 AM

## 2024-07-15 NOTE — TOC Progression Note (Addendum)
 Transition of Care [facility]) - Progression Note    Patient Details  Name: [name] MRN: [id number] Date of Birth: 24-Jul-1946  Transition of Care [facility]) CM/SW Contact  Bridget Cordella Simmonds, LCSW Phone Number: 07/15/2024, 12:48 PM  Clinical Narrative:   Hulan barrows still pending.    1445: SNF auth still pending.  Pt and daughter updated in room.  CSW confirmed with [facility] that they can receive pt over the weekend.  April is weekend contact, call her at main [facility] phone number.   Expected Discharge Plan: Skilled Nursing Facility Barriers to Discharge: Continued Medical Work up               Expected Discharge Plan and Services In-house Referral: Clinical Social Work   Post Acute Care Choice: Skilled Nursing Facility Living arrangements for the past 2 months: Single Family Home                                       Social Drivers of Health (SDOH) Interventions SDOH Screenings   Food Insecurity: No Food Insecurity (07/12/2024)  Housing: Low Risk  (07/12/2024)  Transportation Needs: No Transportation Needs (07/12/2024)  Utilities: Not At Risk (07/12/2024)  Depression (PHQ2-9): Low Risk  (06/23/2024)  Social Connections: Unknown (07/12/2024)  Tobacco Use: Medium Risk (07/12/2024)    Readmission Risk Interventions    10/15/2023    9:49 AM 09/11/2023   11:36 AM 12/02/2021    1:55 PM  Readmission Risk Prevention Plan  Transportation Screening Complete Complete Complete  PCP or Specialist Appt within 3-5 Days  Complete   HRI or Home Care Consult  Complete   Social Work Consult for Recovery Care Planning/Counseling  Complete   Palliative Care Screening  Not Applicable   Medication Review Oceanographer) Referral to Pharmacy Complete Complete  PCP or Specialist appointment within 3-5 days of discharge Complete  Complete  HRI or Home Care Consult Complete  Complete  SW Recovery Care/Counseling Consult Complete  Complete  Palliative Care Screening Not  Applicable  Not Applicable  Skilled Nursing Facility Not Applicable  Patient Refused

## 2024-07-15 NOTE — Progress Notes (Signed)
  Progress Note    07/15/2024 11:59 AM 3 Days Post-Op  Subjective: Still having mild to moderate pain  Vitals:   07/15/24 0344 07/15/24 0849  BP: (!) 128/38 (!) 111/44  Pulse: 65 68  Resp: 16 17  Temp: 98.4 F (36.9 C) (!) 97.5 F (36.4 C)  SpO2: 95% 98%    Physical Exam: Awake alert and oriented Nonlabored respirations Left lower extremity stump protector in place  CBC    Component Value Date/Time   WBC 6.2 07/15/2024 0349   RBC 3.01 (L) 07/15/2024 0349   HGB 7.8 (L) 07/15/2024 0349   HGB 9.3 (L) 06/23/2024 1105   HCT 25.1 (L) 07/15/2024 0349   PLT 163 07/15/2024 0349   PLT 212 06/23/2024 1105   MCV 83.4 07/15/2024 0349   MCH 25.9 (L) 07/15/2024 0349   MCHC 31.1 07/15/2024 0349   RDW 17.2 (H) 07/15/2024 0349   LYMPHSABS 0.9 06/23/2024 1105   MONOABS 0.7 06/23/2024 1105   EOSABS 0.0 06/23/2024 1105   BASOSABS 0.0 06/23/2024 1105    BMET    Component Value Date/Time   NA 137 07/15/2024 0349   NA 144 08/21/2017 1553   K 3.3 (L) 07/15/2024 0349   CL 103 07/15/2024 0349   CO2 23 07/15/2024 0349   GLUCOSE 161 (H) 07/15/2024 0349   BUN 13 07/15/2024 0349   BUN 21 08/21/2017 1553   CREATININE 0.73 07/15/2024 0349   CREATININE 0.74 06/23/2024 1105   CALCIUM  7.9 (L) 07/15/2024 0349   CALCIUM  9.6 09/19/2010 2220   GFRNONAA >60 07/15/2024 0349   GFRNONAA >60 06/23/2024 1105   GFRAA >60 03/29/2020 0314    INR    Component Value Date/Time   INR 1.2 07/12/2024 0919     Intake/Output Summary (Last 24 hours) at 07/15/2024 1159 Last data filed at 07/15/2024 0541 Gross per 24 hour  Intake 701.54 ml  Output 950 ml  Net -248.46 ml     Assessment:  78 y.o. female is s/p left below-knee amputation healing well Plan: Okay for discharge from vascular standpoint   Atisha Hamidi C. Sheree, MD [facility] [place] Office: [phone] Pager: [phone]  07/15/2024 11:59 AM

## 2024-07-15 NOTE — Care Management Important Message (Signed)
 Important Message  Patient Details  Name: [name] MRN: [id number] Date of Birth: [birth year]   Important Message Given:  Yes - Medicare IM     Jon Cruel 07/15/2024, 4:34 PM

## 2024-07-15 NOTE — Progress Notes (Signed)
 Occupational Therapy Treatment Patient Details Name: [name] MRN: [id number] DOB: 29-Mar-1946 Today's Date: 07/15/2024   History of present illness [name] is a 78 y.o. female admitted 07/12/24 for planned L BKA d/t necrotic non-healing left heel ulcer. PMHx: T2DM, HLD, HTN, cervical and rectal CA s/p colostomy, PAD s/p  left common iliac artery stenting (05/30/24), neuropathy, diabetic foot ulcer, hypothyroidism, arthritis, and anemia.   OT comments  Patient seen in conjunction with mobility specialist this date. Patient motivated to complete OOB transfer. Patient with significant stiffness in RLE, requiring total A of 2 to transition to EOB with significant posterior lean. Patient unable to maintain balance without assist. OT doffing limb protector, with noted decreased flexion at L knee and therefore unable to place stedy to attempt transfer. OT and mobility specialist attempting face to face sit<>stand with minimal to no assist from patient and therefore unable to clear hips from bed. OT attempting to educate patient on scooting for potential slide board education but unable to motor plan despite demonstration. RN informed after session with patient placed in chair position in bed. Patient would benefit from slide board education or hoyer OOB if significant weakness and posterior lean persists. OT will continue to follow acutely.       If plan is discharge home, recommend the following:  Two people to help with walking and/or transfers;Two people to help with bathing/dressing/bathroom;Assistance with cooking/housework;Direct supervision/assist for medications management;Direct supervision/assist for financial management;Assist for transportation;Help with stairs or ramp for entrance;Supervision due to cognitive status   Equipment Recommendations  Other (comment) (defer to next venue)    Recommendations for Other Services      Precautions / Restrictions Precautions Precautions:  Fall;Other (comment) (Contact) Recall of Precautions/Restrictions: Impaired Required Braces or Orthoses: Other Brace Other Brace: Limb Guard LLE Restrictions Weight Bearing Restrictions Per Provider Order: Yes LLE Weight Bearing Per Provider Order: Non weight bearing       Mobility Bed Mobility Overal bed mobility: Needs Assistance Bed Mobility: Supine to Sit, Sit to Supine, Rolling Rolling: Used rails, Mod assist, +2 for physical assistance, +2 for safety/equipment   Supine to sit: +2 for physical assistance, +2 for safety/equipment, HOB elevated, Used rails, Total assist Sit to supine: +2 for physical assistance, +2 for safety/equipment, HOB elevated, Total assist   General bed mobility comments: Patient incrementally moving BLEs but with great effort and time needed. OT and mobility specialist completing helicopter with pad with at total A of 2, with significant posterior lean, total A of 2 to return back to bed    Transfers       Sit to Stand: Total assist, +2 physical assistance, +2 safety/equipment           General transfer comment: Attempted stedy, but unable due to inability to bend L knee, attempted face to face sit<>stand but unable to clear hips, and unable to complete incremental scoots     Balance Overall balance assessment: Needs assistance Sitting-balance support: Bilateral upper extremity supported, Feet supported Sitting balance-Leahy Scale: Poor Sitting balance - Comments: Pt sat EOB with min-modA to maintain upright posture. She had a tendency to lean right to decrease pain and posterior d/t lethargy and fatigue. Postural control: Right lateral lean, Posterior lean                                 ADL either performed or assessed with clinical judgement   ADL Overall  ADL's : Needs assistance/impaired Eating/Feeding: Set up;Sitting               Upper Body Dressing : Contact guard assist;Minimal assistance;Sitting          Toilet Transfer Details (indicate cue type and reason): unable to achieve this session         Functional mobility during ADLs: Total assistance;+2 for physical assistance;+2 for safety/equipment;Cueing for sequencing;Cueing for safety General ADL Comments: Patient seen in conjunction with mobility specialist this date. Patient motivated to complete OOB transfer. Patient with significant stiffness in RLE, requiring total A of 2 to transition to EOB with significant posterior lean. Patient unable to maintain balance without assist. OT doffing limb protector, with noted decreased flexion at L knee and therefore unable to place stedy to attempt transfer. OT and mobility specialist attempting face to face sit<>stand with minimal to no assist from patient and therefore unable to clear hips from bed. OT attempting to educate patient on scooting for potential slide board education but unable to motor plan despite demonstration. RN informed after session with patient placed in chair position in bed. Patient would benefit from slide board education or hoyer OOB if significant weakness and posterior lean persists. OT will continue to follow acutely.    Extremity/Trunk Assessment              Vision       Restaurant manager, fast food Communication: No apparent difficulties   Cognition Arousal: Alert Behavior During Therapy: WFL for tasks assessed/performed Cognition: Cognition impaired           Executive functioning impairment (select all impairments): Organization, Reasoning, Problem solving OT - Cognition Comments: More appropriate this session, will continue to assess                 Following commands: Impaired Following commands impaired: Only follows one step commands consistently, Follows multi-step commands inconsistently, Follows multi-step commands with increased time      Cueing   Cueing Techniques: Verbal cues, Gestural cues, Visual cues   Exercises Exercises: Amputee, Other exercises Other Exercises Other Exercises: Educated to bend L knee and touch end to promote decreased sensation    Shoulder Instructions       General Comments      Pertinent Vitals/ Pain       Pain Assessment Pain Assessment: Faces Faces Pain Scale: Hurts whole lot Pain Location: LLE Pain Descriptors / Indicators: Operative site guarding, Guarding, Discomfort, Aching, Sore Pain Intervention(s): Limited activity within patient's tolerance, Monitored during session, Premedicated before session, Repositioned  Home Living                                          Prior Functioning/Environment              Frequency  Min 2X/week        Progress Toward Goals  OT Goals(current goals can now be found in the care plan section)  Progress towards OT goals: Progressing toward goals  Acute Rehab OT Goals Patient Stated Goal: to get better OT Goal Formulation: With patient/family Time For Goal Achievement: 07/27/24 Potential to Achieve Goals: Fair  Plan      Co-evaluation                 AM-PAC OT 6 Clicks Daily Activity  Outcome Measure   Help from another person eating meals?: A Little Help from another person taking care of personal grooming?: A Little Help from another person toileting, which includes using toliet, bedpan, or urinal?: Total Help from another person bathing (including washing, rinsing, drying)?: A Lot Help from another person to put on and taking off regular upper body clothing?: A Little Help from another person to put on and taking off regular lower body clothing?: Total 6 Click Score: 13    End of Session Equipment Utilized During Treatment: Gait belt;Other (comment) Laurent)  OT Visit Diagnosis: Other abnormalities of gait and mobility (R26.89);Unsteadiness on feet (R26.81);History of falling (Z91.81);Muscle weakness (generalized) (M62.81);Other symptoms and signs involving  cognitive function;Pain Pain - Right/Left: Left Pain - part of body: Leg   Activity Tolerance Patient limited by fatigue;Patient limited by lethargy;Patient limited by pain   Patient Left in bed;with call bell/phone within reach;with bed alarm set   Nurse Communication Mobility status;Weight bearing status (Need for lift for OOB)        Time: 8947-8875 OT Time Calculation (min): 32 min  Charges: OT General Charges $OT Visit: 1 Visit OT Treatments $Self Care/Home Management : 23-37 mins  Ronal Gift E. Arlissa Monteverde, OTR/L Acute Rehabilitation Services [phone]   Ronal Gift Salt 07/15/2024, 1:47 PM

## 2024-07-16 DIAGNOSIS — L97429 Non-pressure chronic ulcer of left heel and midfoot with unspecified severity: Secondary | ICD-10-CM | POA: Diagnosis not present

## 2024-07-16 LAB — CBC
HCT: 25.4 % — ABNORMAL LOW (ref 36.0–46.0)
Hemoglobin: 7.7 g/dL — ABNORMAL LOW (ref 12.0–15.0)
MCH: 25.8 pg — ABNORMAL LOW (ref 26.0–34.0)
MCHC: 30.3 g/dL (ref 30.0–36.0)
MCV: 85.2 fL (ref 80.0–100.0)
Platelets: 162 K/uL (ref 150–400)
RBC: 2.98 MIL/uL — ABNORMAL LOW (ref 3.87–5.11)
RDW: 17.4 % — ABNORMAL HIGH (ref 11.5–15.5)
WBC: 4.8 K/uL (ref 4.0–10.5)
nRBC: 0 % (ref 0.0–0.2)

## 2024-07-16 LAB — GLUCOSE, CAPILLARY
Glucose-Capillary: 131 mg/dL — ABNORMAL HIGH (ref 70–99)
Glucose-Capillary: 170 mg/dL — ABNORMAL HIGH (ref 70–99)
Glucose-Capillary: 89 mg/dL (ref 70–99)
Glucose-Capillary: 374 mg/dL — ABNORMAL HIGH (ref 70–99)

## 2024-07-16 LAB — BASIC METABOLIC PANEL WITH GFR
Anion gap: 7 (ref 5–15)
BUN: 13 mg/dL (ref 8–23)
CO2: 22 mmol/L (ref 22–32)
Calcium: 7.9 mg/dL — ABNORMAL LOW (ref 8.9–10.3)
Chloride: 104 mmol/L (ref 98–111)
Creatinine, Ser: 0.73 mg/dL (ref 0.44–1.00)
GFR, Estimated: >60 mL/min (ref >60)
Glucose, Bld: 138 mg/dL — ABNORMAL HIGH (ref 70–99)
Potassium: 4.3 mmol/L (ref 3.5–5.1)
Sodium: 133 mmol/L — ABNORMAL LOW (ref 135–145)

## 2024-07-16 LAB — MAGNESIUM: Magnesium: 1.3 mg/dL — ABNORMAL LOW (ref 1.7–2.4)

## 2024-07-16 MED ORDER — MAGNESIUM OXIDE -MG SUPPLEMENT 400 (240 MG) MG PO TABS
800.0000 mg | ORAL_TABLET | Freq: Once | ORAL | Status: AC
  Administered 2024-07-16: 800 mg via ORAL
  Filled 2024-07-16: qty 2

## 2024-07-16 MED ORDER — PANTOPRAZOLE SODIUM 40 MG PO TBEC
40.0000 mg | DELAYED_RELEASE_TABLET | Freq: Two times a day (BID) | ORAL | Status: DC
  Administered 2024-07-16 – 2024-07-27 (×22): 40 mg via ORAL
  Filled 2024-07-16 (×22): qty 1

## 2024-07-16 MED ORDER — MAGNESIUM SULFATE 4 GM/100ML IV SOLN
4.0000 g | Freq: Once | INTRAVENOUS | Status: AC
  Administered 2024-07-16: 4 g via INTRAVENOUS
  Filled 2024-07-16: qty 100

## 2024-07-16 NOTE — Progress Notes (Signed)
 PROGRESS NOTE    [name]  FMW:[id number] DOB: 31-Oct-1946 DOA: 07/12/2024 PCP: Waylan Almarie SAUNDERS, MD    Brief Narrative:   78 y.o. female with past medical history  of   T2DM, HLD, HTN, cervical and rectal cancer s/p colostomy, diabetic foot ulcer, neuropathy, hypothyroidism, hypomagnesemia, anemia and arthritis,   PAD left lower extremity angiogram with left common iliac artery stenting on 05/30/2024 by Dr. Sheree for critical limb ischemia for left heel ulceration , seen in clinic at vascular for left heel ulcer and foul smelling drainage, and pt was advised for left BKA.Pt was scheduled for Necrotic left heel ulcer bka.  Since her recent admission and discharge in July for same pt's ulcer had not healed, rather gotten worse.  Pt seen in PACU and no HPI. Very somnolent and sedated ahs not recovered from anesthesia.   Upon admission she slowly improved.  She was eventually cleared by vascular surgery for discharge, home Plavix  was resumed.  PT/OT recommended SNF.  Due to some drop in hemoglobin required 1 unit PRBC transfusion on 8/28.  Family updated during the hospitalization.  Currently awaiting SNF placement.  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 -Pain control and bowel regimen.  Resume Plavix  and statin -No further need for antibiotics, discussed with vascular  Mechanical fall with recent left femur fracture -Patient had a fall requiring ORIF in July 2025.  Recovering as expected.  Continue to closely monitor  Diabetes mellitus type 2, uncontrolled due to hyperglycemia -Sliding scale and Accu-Chek. On semglee , resume metformin  upon dc  Hypomagnesemia/HypoKalemia -Aggressive repletion.    Essential hypertension -IV as needed  History of cervical/rectal cancer status post colectomy -Follows outpatient oncology  team  Anemia of chronic disease/ - Baseline hemoglobin around 10.0, postop drifted down.  S/p 1U Prbc  8/28, now stable.   Hyperlipidemia -Lipitor  Hypothyroidism -Synthroid   GERD -PPI  Mood disorder -Zoloft   TOC consulted for SNF placement  DVT prophylaxis: Subcu heparin     Code Status: Full Code Family Communication: Son and husband at bedside Status is: Inpatient Remains inpatient appropriate because SNF placement   PT Follow up Recs: Skilled Nursing-Short Term Rehab (<3 Hours/Day)07/13/2024 1200  Subjective: Finished eating her breakfast.  No complaints Understands she is awaiting SNF placement.  Examination:  General exam: Appears calm and comfortable  Respiratory system: Clear to auscultation. Respiratory effort normal. Cardiovascular system: S1 & S2 heard, RRR. No JVD, murmurs, rubs, gallops or clicks. No pedal edema. Gastrointestinal system: Abdomen is nondistended, soft and nontender. No organomegaly or masses felt. Normal bowel sounds heard. Central nervous system: Alert and oriented to her name. No focal neurological deficits. Extremities: Left BKA noted Skin: No rashes, lesions or ulcers Psychiatry: Judgement and insight appear poor                Diet Orders (From admission, onward)     Start     Ordered   07/12/24 1532  Diet Carb Modified Fluid consistency: Thin; Room service appropriate? Yes  Diet effective now       Question Answer Comment  Diet-HS Snack? Nothing   Calorie Level Medium 1600-2000   Fluid consistency: Thin   Room service appropriate? Yes      07/12/24 1532            Objective: Vitals:   07/15/24 1952 07/15/24 2016  07/16/24 0402 07/16/24 0923  BP: (!) 125/32  (!) 129/40 108/62  Pulse: 68  (!) 57 (!) 57  Resp: 16  20 16   Temp: 98.8 F (37.1 C)  98.3 F (36.8 C) (!) 97.5 F (36.4 C)  TempSrc:  Oral Oral Oral  SpO2: 98%   100%  Weight:      Height:        Intake/Output Summary (Last 24 hours) at  07/16/2024 0957 Last data filed at 07/16/2024 0449 Gross per 24 hour  Intake 830 ml  Output 2000 ml  Net -1170 ml   Filed Weights   07/11/24 1005 07/12/24 0900 07/13/24 0414  Weight: 71.7 kg 71.7 kg 66.1 kg    Scheduled Meds:  atorvastatin   40 mg Oral QHS   Chlorhexidine  Gluconate Cloth  6 each Topical Once   clopidogrel   75 mg Oral Daily   gabapentin   300 mg Oral TID   heparin   5,000 Units Subcutaneous Q8H   insulin  aspart  0-15 Units Subcutaneous TID WC   insulin  aspart  0-5 Units Subcutaneous QHS   insulin  glargine  10 Units Subcutaneous Daily   levothyroxine   150 mcg Oral Q0600   magnesium  oxide  800 mg Oral Once   oxyCODONE   10 mg Oral Q12H   pantoprazole  (PROTONIX ) IV  40 mg Intravenous Q12H   sertraline   150 mg Oral QHS   sodium chloride  flush  10-40 mL Intracatheter Q12H   Continuous Infusions:  magnesium  sulfate bolus IVPB      Nutritional status     Body mass index is 28.46 kg/m.  Data Reviewed:   CBC: Recent Labs  Lab 07/12/24 1828 07/12/24 1952 07/13/24 0242 07/14/24 0015 07/14/24 1924 07/15/24 0349 07/16/24 0301  WBC 7.0  --  10.3 7.3  --  6.2 4.8  HGB 7.6*   < > 7.5* 6.5* 7.9* 7.8* 7.7*  HCT 25.8*   < > 25.7* 22.6* 25.0* 25.1* 25.4*  MCV 83.2  --  84.3 84.3  --  83.4 85.2  PLT 189  --  218 189  --  163 162   < > = values in this interval not displayed.   Basic Metabolic Panel: Recent Labs  Lab 07/12/24 0919 07/12/24 1828 07/13/24 0242 07/14/24 0015 07/15/24 0349 07/16/24 0301  NA 138  --  138 138 137 133*  K 4.0  --  3.8 3.7 3.3* 4.3  CL 104  --  105 105 103 104  CO2 21*  --  23 23 23 22   GLUCOSE 128*  --  168* 176* 161* 138*  BUN 21  --  21 17 13 13   CREATININE 0.81 0.75 0.77 0.72 0.73 0.73  CALCIUM  9.3  --  8.1* 8.0* 7.9* 7.9*  MG  --  0.9* 2.1 1.2* 1.7 1.3*  PHOS  --   --  3.7 3.0  --   --    GFR: Estimated Creatinine Clearance: 49.1 mL/min (by C-G formula based on SCr of 0.73 mg/dL). Liver Function Tests: Recent Labs   Lab 07/12/24 0919 07/13/24 0242  AST 25 21  ALT 21 15  ALKPHOS 126 96  BILITOT 0.5 0.6  PROT 7.0 5.3*  ALBUMIN 2.6* 2.0*   No results for input(s): LIPASE, AMYLASE in the last 168 hours. No results for input(s): AMMONIA in the last 168 hours. Coagulation Profile: Recent Labs  Lab 07/12/24 0919  INR 1.2   Cardiac Enzymes: No results for input(s): CKTOTAL, CKMB, CKMBINDEX, TROPONINI in the last 168 hours.  BNP (last 3 results) No results for input(s): PROBNP in the last 8760 hours. HbA1C: No results for input(s): HGBA1C in the last 72 hours. CBG: Recent Labs  Lab 07/15/24 0603 07/15/24 1150 07/15/24 1641 07/15/24 2142 07/16/24 0613  GLUCAP 166* 215* 141* 190* 131*   Lipid Profile: No results for input(s): CHOL, HDL, LDLCALC, TRIG, CHOLHDL, LDLDIRECT in the last 72 hours. Thyroid  Function Tests: No results for input(s): TSH, T4TOTAL, FREET4, T3FREE, THYROIDAB in the last 72 hours. Anemia Panel: No results for input(s): VITAMINB12, FOLATE, FERRITIN, TIBC, IRON, RETICCTPCT in the last 72 hours. Sepsis Labs: Recent Labs  Lab 07/12/24 1828  LATICACIDVEN 0.7    Recent Results (from the past 240 hours)  Surgical pcr screen     Status: None   Collection Time: 07/12/24  9:25 AM   Specimen: Nasal Swab  Result Value Ref Range Status   MRSA, PCR NEGATIVE NEGATIVE Final   Staphylococcus aureus NEGATIVE NEGATIVE Final    Comment: (NOTE) The Xpert SA Assay (FDA approved for NASAL specimens in patients 37 years of age and older), is one component of a comprehensive surveillance program. It is not intended to diagnose infection nor to guide or monitor treatment. Performed at [facility], [address], [place], [place] [address]          Radiology Studies: No results found.         LOS: 4 days   Time spent= 35 mins    Burgess JAYSON Dare, MD [facility]  If 7PM-7AM, please contact  night-coverage  07/16/2024, 9:57 AM

## 2024-07-16 NOTE — TOC Progression Note (Signed)
 Transition of Care [facility]) - Progression Note    Patient Details  Name: [name] MRN: [id number] Date of Birth: [birth year]  [facility] CM/SW Contact  Hartley KATHEE Robertson, LCSWA Phone Number: 07/16/2024, 10:48 AM  Clinical Narrative:     Shara still pending, ICM will continue to follow.   Expected Discharge Plan: Skilled Nursing Facility Barriers to Discharge: Continued Medical Work up               Expected Discharge Plan and Services In-house Referral: Clinical Social Work   Post Acute Care Choice: Skilled Nursing Facility Living arrangements for the past 2 months: Single Family Home                                       Social Drivers of Health (SDOH) Interventions SDOH Screenings   Food Insecurity: No Food Insecurity (07/12/2024)  Housing: Low Risk  (07/12/2024)  Transportation Needs: No Transportation Needs (07/12/2024)  Utilities: Not At Risk (07/12/2024)  Depression (PHQ2-9): Low Risk  (06/23/2024)  Social Connections: Unknown (07/12/2024)  Tobacco Use: Medium Risk (07/12/2024)    Readmission Risk Interventions    10/15/2023    9:49 AM 09/11/2023   11:36 AM 12/02/2021    1:55 PM  Readmission Risk Prevention Plan  Transportation Screening Complete Complete Complete  PCP or Specialist Appt within 3-5 Days  Complete   HRI or Home Care Consult  Complete   Social Work Consult for Recovery Care Planning/Counseling  Complete   Palliative Care Screening  Not Applicable   Medication Review Oceanographer) Referral to Pharmacy Complete Complete  PCP or Specialist appointment within 3-5 days of discharge Complete  Complete  HRI or Home Care Consult Complete  Complete  SW Recovery Care/Counseling Consult Complete  Complete  Palliative Care Screening Not Applicable  Not Applicable  Skilled Nursing Facility Not Applicable  Patient Refused

## 2024-07-16 NOTE — Plan of Care (Signed)
  Problem: Education: Goal: Knowledge of General Education information will improve Description: Including pain rating scale, medication(s)/side effects and non-pharmacologic comfort measures Outcome: Progressing   Problem: Health Behavior/Discharge Planning: Goal: Ability to manage health-related needs will improve Outcome: Progressing   Problem: Clinical Measurements: Goal: Ability to maintain clinical measurements within normal limits will improve Outcome: Progressing Goal: Will remain free from infection Outcome: Progressing Goal: Diagnostic test results will improve Outcome: Progressing Goal: Respiratory complications will improve Outcome: Progressing Goal: Cardiovascular complication will be avoided Outcome: Progressing   Problem: Activity: Goal: Risk for activity intolerance will decrease Outcome: Progressing   Problem: Nutrition: Goal: Adequate nutrition will be maintained Outcome: Progressing   Problem: Coping: Goal: Level of anxiety will decrease Outcome: Progressing   Problem: Elimination: Goal: Will not experience complications related to bowel motility Outcome: Progressing Goal: Will not experience complications related to urinary retention Outcome: Progressing   Problem: Pain Managment: Goal: General experience of comfort will improve and/or be controlled Outcome: Progressing   Problem: Safety: Goal: Ability to remain free from injury will improve Outcome: Progressing   Problem: Skin Integrity: Goal: Risk for impaired skin integrity will decrease Outcome: Progressing   Problem: Education: Goal: Ability to describe self-care measures that may prevent or decrease complications (Diabetes Survival Skills Education) will improve Outcome: Progressing Goal: Individualized Educational Video(s) Outcome: Progressing   Problem: Coping: Goal: Ability to adjust to condition or change in health will improve Outcome: Progressing   Problem: Fluid  Volume: Goal: Ability to maintain a balanced intake and output will improve Outcome: Progressing   Problem: Health Behavior/Discharge Planning: Goal: Ability to identify and utilize available resources and services will improve Outcome: Progressing Goal: Ability to manage health-related needs will improve Outcome: Progressing   Problem: Metabolic: Goal: Ability to maintain appropriate glucose levels will improve Outcome: Progressing   Problem: Nutritional: Goal: Maintenance of adequate nutrition will improve Outcome: Progressing Goal: Progress toward achieving an optimal weight will improve Outcome: Progressing   Problem: Skin Integrity: Goal: Risk for impaired skin integrity will decrease Outcome: Progressing   Problem: Tissue Perfusion: Goal: Adequacy of tissue perfusion will improve Outcome: Progressing   Problem: Clinical Measurements: Goal: Ability to avoid or minimize complications of infection will improve Outcome: Progressing   Problem: Skin Integrity: Goal: Skin integrity will improve Outcome: Progressing   Problem: Education: Goal: Knowledge of the prescribed therapeutic regimen will improve Outcome: Progressing Goal: Ability to verbalize activity precautions or restrictions will improve Outcome: Progressing Goal: Understanding of discharge needs will improve Outcome: Progressing   Problem: Activity: Goal: Ability to perform//tolerate increased activity and mobilize with assistive devices will improve Outcome: Progressing   Problem: Clinical Measurements: Goal: Postoperative complications will be avoided or minimized Outcome: Progressing   Problem: Self-Care: Goal: Ability to meet self-care needs will improve Outcome: Progressing   Problem: Self-Concept: Goal: Ability to maintain and perform role responsibilities to the fullest extent possible will improve Outcome: Progressing   Problem: Pain Management: Goal: Pain level will decrease with  appropriate interventions Outcome: Progressing   Problem: Skin Integrity: Goal: Demonstration of wound healing without infection will improve Outcome: Progressing

## 2024-07-17 DIAGNOSIS — L97429 Non-pressure chronic ulcer of left heel and midfoot with unspecified severity: Secondary | ICD-10-CM | POA: Diagnosis not present

## 2024-07-17 LAB — GLUCOSE, CAPILLARY
Glucose-Capillary: 134 mg/dL — ABNORMAL HIGH (ref 70–99)
Glucose-Capillary: 140 mg/dL — ABNORMAL HIGH (ref 70–99)
Glucose-Capillary: 164 mg/dL — ABNORMAL HIGH (ref 70–99)
Glucose-Capillary: 105 mg/dL — ABNORMAL HIGH (ref 70–99)
Glucose-Capillary: 124 mg/dL — ABNORMAL HIGH (ref 70–99)

## 2024-07-17 LAB — MAGNESIUM: Magnesium: 1.9 mg/dL (ref 1.7–2.4)

## 2024-07-17 NOTE — TOC Progression Note (Signed)
 Transition of Care [facility]) - Progression Note    Patient Details  Name: [name] MRN: [id number] Date of Birth: 1946-10-22  Transition of Care [facility]) CM/SW Contact  Isaiah Public, LCSWA Phone Number: 07/17/2024, 12:17 PM  Clinical Narrative:     Patients insurance authorization currently pending for SNF. CSW informed MD and updated Randine with [employer]. TOC will continue to follow.   Expected Discharge Plan: Skilled Nursing Facility Barriers to Discharge: Continued Medical Work up               Expected Discharge Plan and Services In-house Referral: Clinical Social Work   Post Acute Care Choice: Skilled Nursing Facility Living arrangements for the past 2 months: Single Family Home Expected Discharge Date: 07/17/24                                     Social Drivers of Health (SDOH) Interventions SDOH Screenings   Food Insecurity: No Food Insecurity (07/12/2024)  Housing: Low Risk  (07/12/2024)  Transportation Needs: No Transportation Needs (07/12/2024)  Utilities: Not At Risk (07/12/2024)  Depression (PHQ2-9): Low Risk  (06/23/2024)  Social Connections: Unknown (07/12/2024)  Tobacco Use: Medium Risk (07/12/2024)    Readmission Risk Interventions    10/15/2023    9:49 AM 09/11/2023   11:36 AM 12/02/2021    1:55 PM  Readmission Risk Prevention Plan  Transportation Screening Complete Complete Complete  PCP or Specialist Appt within 3-5 Days  Complete   HRI or Home Care Consult  Complete   Social Work Consult for Recovery Care Planning/Counseling  Complete   Palliative Care Screening  Not Applicable   Medication Review Oceanographer) Referral to Pharmacy Complete Complete  PCP or Specialist appointment within 3-5 days of discharge Complete  Complete  HRI or Home Care Consult Complete  Complete  SW Recovery Care/Counseling Consult Complete  Complete  Palliative Care Screening Not Applicable  Not Applicable  Skilled Nursing Facility Not Applicable   Patient Refused

## 2024-07-17 NOTE — Progress Notes (Signed)
 PROGRESS NOTE    [name]  FMW:[id number] DOB: 14-Jun-1946 DOA: 07/12/2024 PCP: Waylan Almarie SAUNDERS, MD    Brief Narrative:   78 y.o. female with past medical history  of   T2DM, HLD, HTN, cervical and rectal cancer s/p colostomy, diabetic foot ulcer, neuropathy, hypothyroidism, hypomagnesemia, anemia and arthritis,   PAD left lower extremity angiogram with left common iliac artery stenting on 05/30/2024 by Dr. Sheree for critical limb ischemia for left heel ulceration , seen in clinic at vascular for left heel ulcer and foul smelling drainage, and pt was advised for left BKA.Pt was scheduled for Necrotic left heel ulcer bka.  Since her recent admission and discharge in July for same pt's ulcer had not healed, rather gotten worse.  Pt seen in PACU and no HPI. Very somnolent and sedated ahs not recovered from anesthesia.   Upon admission she slowly improved.  She was eventually cleared by vascular surgery for discharge, home Plavix  was resumed.  PT/OT recommended SNF.  Due to some drop in hemoglobin required 1 unit PRBC transfusion on 8/28.  Family updated during the hospitalization.  Currently awaiting SNF placement.  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 -Pain control and bowel regimen.  Resume Plavix  and statin -No further need for antibiotics, discussed with vascular  Mechanical fall with recent left femur fracture -Patient had a fall requiring ORIF in July 2025.  Recovering as expected.  Continue to closely monitor  Diabetes mellitus type 2, uncontrolled due to hyperglycemia -Sliding scale and Accu-Chek. On semglee , resume metformin  upon dc  Hypomagnesemia/HypoKalemia -Aggressive repletion.    Essential hypertension -IV as needed  History of cervical/rectal cancer status post colectomy -Follows outpatient oncology  team  Anemia of chronic disease/ - Baseline hemoglobin around 10.0, postop drifted down.  S/p 1U Prbc  8/28, now stable.   Hyperlipidemia -Lipitor  Hypothyroidism -Synthroid   GERD -PPI  Mood disorder -Zoloft   TOC consulted for SNF placement  DVT prophylaxis: Subcu heparin     Code Status: Full Code Family Communication: Son and husband at bedside Status is: Inpatient Remains inpatient appropriate because SNF placement   PT Follow up Recs: Skilled Nursing-Short Term Rehab (<3 Hours/Day)07/13/2024 1200  Subjective: No complaints waiting for SNF placement  Examination:  General exam: Appears calm and comfortable  Respiratory system: Clear to auscultation. Respiratory effort normal. Cardiovascular system: S1 & S2 heard, RRR. No JVD, murmurs, rubs, gallops or clicks. No pedal edema. Gastrointestinal system: Abdomen is nondistended, soft and nontender. No organomegaly or masses felt. Normal bowel sounds heard. Central nervous system: Alert and oriented to her name. No focal neurological deficits. Extremities: Left BKA noted Skin: No rashes, lesions or ulcers Psychiatry: Judgement and insight appear poor                Diet Orders (From admission, onward)     Start     Ordered   07/12/24 1532  Diet Carb Modified Fluid consistency: Thin; Room service appropriate? Yes  Diet effective now       Question Answer Comment  Diet-HS Snack? Nothing   Calorie Level Medium 1600-2000   Fluid consistency: Thin   Room service appropriate? Yes      07/12/24 1532            Objective: Vitals:   07/16/24 0923 07/16/24 1636 07/16/24 1932 07/17/24 0806  BP: 108/62 ROLLEN)  124/40 (!) 129/32 (!) 119/33  Pulse: (!) 57 (!) 55 64 (!) 55  Resp: 16 16 18 18   Temp: (!) 97.5 F (36.4 C) 98.7 F (37.1 C) 98.7 F (37.1 C) 98.3 F (36.8 C)  TempSrc: Oral Oral Oral Oral  SpO2: 100% 97% 100% 96%  Weight:      Height:        Intake/Output Summary (Last 24 hours) at 07/17/2024  0945 Last data filed at 07/17/2024 0215 Gross per 24 hour  Intake 816 ml  Output 600 ml  Net 216 ml   Filed Weights   07/11/24 1005 07/12/24 0900 07/13/24 0414  Weight: 71.7 kg 71.7 kg 66.1 kg    Scheduled Meds:  atorvastatin   40 mg Oral QHS   Chlorhexidine  Gluconate Cloth  6 each Topical Once   clopidogrel   75 mg Oral Daily   gabapentin   300 mg Oral TID   heparin   5,000 Units Subcutaneous Q8H   insulin  aspart  0-15 Units Subcutaneous TID WC   insulin  aspart  0-5 Units Subcutaneous QHS   insulin  glargine  10 Units Subcutaneous Daily   levothyroxine   150 mcg Oral Q0600   oxyCODONE   10 mg Oral Q12H   pantoprazole   40 mg Oral BID   sertraline   150 mg Oral QHS   sodium chloride  flush  10-40 mL Intracatheter Q12H   Continuous Infusions:  Nutritional status     Body mass index is 28.46 kg/m.  Data Reviewed:   CBC: Recent Labs  Lab 07/12/24 1828 07/12/24 1952 07/13/24 0242 07/14/24 0015 07/14/24 1924 07/15/24 0349 07/16/24 0301  WBC 7.0  --  10.3 7.3  --  6.2 4.8  HGB 7.6*   < > 7.5* 6.5* 7.9* 7.8* 7.7*  HCT 25.8*   < > 25.7* 22.6* 25.0* 25.1* 25.4*  MCV 83.2  --  84.3 84.3  --  83.4 85.2  PLT 189  --  218 189  --  163 162   < > = values in this interval not displayed.   Basic Metabolic Panel: Recent Labs  Lab 07/12/24 0919 07/12/24 0919 07/12/24 1828 07/13/24 0242 07/14/24 0015 07/15/24 0349 07/16/24 0301 07/17/24 0309  NA 138  --   --  138 138 137 133*  --   K 4.0  --   --  3.8 3.7 3.3* 4.3  --   CL 104  --   --  105 105 103 104  --   CO2 21*  --   --  23 23 23 22   --   GLUCOSE 128*  --   --  168* 176* 161* 138*  --   BUN 21  --   --  21 17 13 13   --   CREATININE 0.81  --  0.75 0.77 0.72 0.73 0.73  --   CALCIUM  9.3  --   --  8.1* 8.0* 7.9* 7.9*  --   MG  --    < > 0.9* 2.1 1.2* 1.7 1.3* 1.9  PHOS  --   --   --  3.7 3.0  --   --   --    < > = values in this interval not displayed.   GFR: Estimated Creatinine Clearance: 49.1 mL/min (by C-G formula  based on SCr of 0.73 mg/dL). Liver Function Tests: Recent Labs  Lab 07/12/24 0919 07/13/24 0242  AST 25 21  ALT 21 15  ALKPHOS 126 96  BILITOT 0.5 0.6  PROT 7.0 5.3*  ALBUMIN 2.6* 2.0*  No results for input(s): LIPASE, AMYLASE in the last 168 hours. No results for input(s): AMMONIA in the last 168 hours. Coagulation Profile: Recent Labs  Lab 07/12/24 0919  INR 1.2   Cardiac Enzymes: No results for input(s): CKTOTAL, CKMB, CKMBINDEX, TROPONINI in the last 168 hours. BNP (last 3 results) No results for input(s): PROBNP in the last 8760 hours. HbA1C: No results for input(s): HGBA1C in the last 72 hours. CBG: Recent Labs  Lab 07/16/24 1135 07/16/24 1636 07/16/24 2122 07/17/24 0617 07/17/24 0629  GLUCAP 170* 89 374* 140* 134*   Lipid Profile: No results for input(s): CHOL, HDL, LDLCALC, TRIG, CHOLHDL, LDLDIRECT in the last 72 hours. Thyroid  Function Tests: No results for input(s): TSH, T4TOTAL, FREET4, T3FREE, THYROIDAB in the last 72 hours. Anemia Panel: No results for input(s): VITAMINB12, FOLATE, FERRITIN, TIBC, IRON, RETICCTPCT in the last 72 hours. Sepsis Labs: Recent Labs  Lab 07/12/24 1828  LATICACIDVEN 0.7    Recent Results (from the past 240 hours)  Surgical pcr screen     Status: None   Collection Time: 07/12/24  9:25 AM   Specimen: Nasal Swab  Result Value Ref Range Status   MRSA, PCR NEGATIVE NEGATIVE Final   Staphylococcus aureus NEGATIVE NEGATIVE Final    Comment: (NOTE) The Xpert SA Assay (FDA approved for NASAL specimens in patients 42 years of age and older), is one component of a comprehensive surveillance program. It is not intended to diagnose infection nor to guide or monitor treatment. Performed at [facility], [address], [place], [place] [address]          Radiology Studies: No results found.         LOS: 5 days   Time spent= 35 mins    Burgess JAYSON Dare,  MD [facility]  If 7PM-7AM, please contact night-coverage  07/17/2024, 9:45 AM

## 2024-07-17 NOTE — Plan of Care (Signed)
  Problem: Clinical Measurements: Goal: Will remain free from infection Outcome: Not Progressing   Problem: Activity: Goal: Risk for activity intolerance will decrease Outcome: Not Progressing   Problem: Pain Managment: Goal: General experience of comfort will improve and/or be controlled Outcome: Not Progressing   Problem: Safety: Goal: Ability to remain free from injury will improve Outcome: Not Progressing   Problem: Skin Integrity: Goal: Risk for impaired skin integrity will decrease Outcome: Not Progressing

## 2024-07-18 DIAGNOSIS — L97429 Non-pressure chronic ulcer of left heel and midfoot with unspecified severity: Secondary | ICD-10-CM | POA: Diagnosis not present

## 2024-07-18 LAB — GLUCOSE, CAPILLARY
Glucose-Capillary: 112 mg/dL — ABNORMAL HIGH (ref 70–99)
Glucose-Capillary: 170 mg/dL — ABNORMAL HIGH (ref 70–99)
Glucose-Capillary: 124 mg/dL — ABNORMAL HIGH (ref 70–99)
Glucose-Capillary: 168 mg/dL — ABNORMAL HIGH (ref 70–99)

## 2024-07-18 LAB — MAGNESIUM: Magnesium: 1.2 mg/dL — ABNORMAL LOW (ref 1.7–2.4)

## 2024-07-18 MED ORDER — HALOPERIDOL LACTATE 5 MG/ML IJ SOLN: 1.0000 mg | Freq: Once | INTRAMUSCULAR | Status: DC

## 2024-07-18 MED ORDER — MAGNESIUM SULFATE 4 GM/100ML IV SOLN
4.0000 g | Freq: Once | INTRAVENOUS | Status: AC
  Administered 2024-07-18: 4 g via INTRAVENOUS
  Filled 2024-07-18: qty 100

## 2024-07-18 NOTE — Progress Notes (Signed)
 PROGRESS NOTE    AVLEEN BORDWELL  FMW:[id number] DOB: Dec 31, 1945 DOA: 07/12/2024 PCP: Waylan Almarie SAUNDERS, MD    Brief Narrative:   78 y.o. female with past medical history  of   T2DM, HLD, HTN, cervical and rectal cancer s/p colostomy, diabetic foot ulcer, neuropathy, hypothyroidism, hypomagnesemia, anemia and arthritis,   PAD left lower extremity angiogram with left common iliac artery stenting on 05/30/2024 by Dr. Sheree for critical limb ischemia for left heel ulceration , seen in clinic at vascular for left heel ulcer and foul smelling drainage, and pt was advised for left BKA.Pt was scheduled for Necrotic left heel ulcer bka.  Since her recent admission and discharge in July for same pt's ulcer had not healed, rather gotten worse.  Pt seen in PACU and no HPI. Very somnolent and sedated ahs not recovered from anesthesia.   Upon admission she slowly improved.  She was eventually cleared by vascular surgery for discharge, home Plavix  was resumed.  PT/OT recommended SNF.  Due to some drop in hemoglobin required 1 unit PRBC transfusion on 8/28.  Family updated during the hospitalization.  Currently awaiting SNF placement.  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 -Pain control and bowel regimen.  Resume Plavix  and statin -No further need for antibiotics, discussed with vascular  Mechanical fall with recent left femur fracture -Patient had a fall requiring ORIF in July 2025.  Recovering as expected.  Continue to closely monitor  Diabetes mellitus type 2, uncontrolled due to hyperglycemia -Sliding scale and Accu-Chek. On semglee , resume metformin  upon dc  Hypomagnesemia/HypoKalemia -Aggressive repletion.  Will prescribe mag oxide upon discharge due to recurrent hypomagnesemia despite of normal potassium and phosphorus.  Recheck  electrolytes again in next 3-5 days  Essential hypertension -IV as needed  History of cervical/rectal cancer status post colectomy -Follows outpatient oncology team  Anemia of chronic disease/ - Baseline hemoglobin around 10.0, postop drifted down.  S/p 1U Prbc  8/28, now stable.   Hyperlipidemia -Lipitor  Hypothyroidism -Synthroid   GERD -PPI  Mood disorder -Zoloft   TOC consulted for SNF placement  DVT prophylaxis: Subcu heparin     Code Status: Full Code Family Communication: Son and husband at bedside Status is: Inpatient Remains inpatient appropriate because SNF placement   PT Follow up Recs: Skilled Nursing-Short Term Rehab (<3 Hours/Day)07/13/2024 1200  Subjective: No complaints awaiting SNF placement  Examination:  General exam: Appears calm and comfortable  Respiratory system: Clear to auscultation. Respiratory effort normal. Cardiovascular system: S1 & S2 heard, RRR. No JVD, murmurs, rubs, gallops or clicks. No pedal edema. Gastrointestinal system: Abdomen is nondistended, soft and nontender. No organomegaly or masses felt. Normal bowel sounds heard. Central nervous system: Alert and oriented to her name. No focal neurological deficits. Extremities: Left BKA noted Skin: No rashes, lesions or ulcers Psychiatry: Judgement and insight appear poor                Diet Orders (From admission, onward)     Start     Ordered   07/12/24 1532  Diet Carb Modified Fluid consistency: Thin; Room service appropriate? Yes  Diet effective now       Question Answer Comment  Diet-HS Snack? Nothing   Calorie Level Medium 1600-2000   Fluid consistency: Thin   Room service appropriate? Yes      07/12/24 1532  Objective: Vitals:   07/18/24 0038 07/18/24 0040 07/18/24 0500 07/18/24 0806  BP:   (!) 127/47 (!) 125/44  Pulse:   60 (!) 56  Resp:   17 17  Temp:   98.2 F (36.8 C) 98 F (36.7 C)  TempSrc:   Oral Oral  SpO2: (!) 88% 95% 95% 100%   Weight:      Height:        Intake/Output Summary (Last 24 hours) at 07/18/2024 1116 Last data filed at 07/18/2024 0900 Gross per 24 hour  Intake 360 ml  Output 1125 ml  Net -765 ml   Filed Weights   07/11/24 1005 07/12/24 0900 07/13/24 0414  Weight: 71.7 kg 71.7 kg 66.1 kg    Scheduled Meds:  atorvastatin   40 mg Oral QHS   Chlorhexidine  Gluconate Cloth  6 each Topical Once   clopidogrel   75 mg Oral Daily   gabapentin   300 mg Oral TID   haloperidol  lactate  1 mg Intravenous Once   heparin   5,000 Units Subcutaneous Q8H   insulin  aspart  0-15 Units Subcutaneous TID WC   insulin  aspart  0-5 Units Subcutaneous QHS   insulin  glargine  10 Units Subcutaneous Daily   levothyroxine   150 mcg Oral Q0600   oxyCODONE   10 mg Oral Q12H   pantoprazole   40 mg Oral BID   sertraline   150 mg Oral QHS   sodium chloride  flush  10-40 mL Intracatheter Q12H   Continuous Infusions:  magnesium  sulfate bolus IVPB 4 g (07/18/24 1054)    Nutritional status     Body mass index is 28.46 kg/m.  Data Reviewed:   CBC: Recent Labs  Lab 07/12/24 1828 07/12/24 1952 07/13/24 0242 07/14/24 0015 07/14/24 1924 07/15/24 0349 07/16/24 0301  WBC 7.0  --  10.3 7.3  --  6.2 4.8  HGB 7.6*   < > 7.5* 6.5* 7.9* 7.8* 7.7*  HCT 25.8*   < > 25.7* 22.6* 25.0* 25.1* 25.4*  MCV 83.2  --  84.3 84.3  --  83.4 85.2  PLT 189  --  218 189  --  163 162   < > = values in this interval not displayed.   Basic Metabolic Panel: Recent Labs  Lab 07/12/24 0919 07/12/24 0919 07/12/24 1828 07/13/24 0242 07/14/24 0015 07/15/24 0349 07/16/24 0301 07/17/24 0309 07/18/24 0218  NA 138  --   --  138 138 137 133*  --   --   K 4.0  --   --  3.8 3.7 3.3* 4.3  --   --   CL 104  --   --  105 105 103 104  --   --   CO2 21*  --   --  23 23 23 22   --   --   GLUCOSE 128*  --   --  168* 176* 161* 138*  --   --   BUN 21  --   --  21 17 13 13   --   --   CREATININE 0.81  --  0.75 0.77 0.72 0.73 0.73  --   --   CALCIUM  9.3  --    --  8.1* 8.0* 7.9* 7.9*  --   --   MG  --    < > 0.9* 2.1 1.2* 1.7 1.3* 1.9 1.2*  PHOS  --   --   --  3.7 3.0  --   --   --   --    < > = values  in this interval not displayed.   GFR: Estimated Creatinine Clearance: 49.1 mL/min (by C-G formula based on SCr of 0.73 mg/dL). Liver Function Tests: Recent Labs  Lab 07/12/24 0919 07/13/24 0242  AST 25 21  ALT 21 15  ALKPHOS 126 96  BILITOT 0.5 0.6  PROT 7.0 5.3*  ALBUMIN 2.6* 2.0*   No results for input(s): LIPASE, AMYLASE in the last 168 hours. No results for input(s): AMMONIA in the last 168 hours. Coagulation Profile: Recent Labs  Lab 07/12/24 0919  INR 1.2   Cardiac Enzymes: No results for input(s): CKTOTAL, CKMB, CKMBINDEX, TROPONINI in the last 168 hours. BNP (last 3 results) No results for input(s): PROBNP in the last 8760 hours. HbA1C: No results for input(s): HGBA1C in the last 72 hours. CBG: Recent Labs  Lab 07/17/24 1120 07/17/24 1637 07/17/24 2157 07/18/24 0616 07/18/24 1113  GLUCAP 164* 105* 124* 112* 170*   Lipid Profile: No results for input(s): CHOL, HDL, LDLCALC, TRIG, CHOLHDL, LDLDIRECT in the last 72 hours. Thyroid  Function Tests: No results for input(s): TSH, T4TOTAL, FREET4, T3FREE, THYROIDAB in the last 72 hours. Anemia Panel: No results for input(s): VITAMINB12, FOLATE, FERRITIN, TIBC, IRON, RETICCTPCT in the last 72 hours. Sepsis Labs: Recent Labs  Lab 07/12/24 1828  LATICACIDVEN 0.7    Recent Results (from the past 240 hours)  Surgical pcr screen     Status: None   Collection Time: 07/12/24  9:25 AM   Specimen: Nasal Swab  Result Value Ref Range Status   MRSA, PCR NEGATIVE NEGATIVE Final   Staphylococcus aureus NEGATIVE NEGATIVE Final    Comment: (NOTE) The Xpert SA Assay (FDA approved for NASAL specimens in patients 82 years of age and older), is one component of a comprehensive surveillance program. It is not intended to diagnose  infection nor to guide or monitor treatment. Performed at [facility], [address] [place], [place] [address]          Radiology Studies: No results found.         LOS: 6 days   Time spent= 35 mins    Burgess JAYSON Dare, MD [facility]  If 7PM-7AM, please contact night-coverage  07/18/2024, 11:16 AM

## 2024-07-18 NOTE — Progress Notes (Signed)
 Physical Therapy Treatment Patient Details Name: [name] MRN: [id number] DOB: 09-15-1946 Today's Date: 07/18/2024   History of Present Illness TINISHA ETZKORN is a 78 y.o. female admitted 07/12/24 for planned L BKA d/t necrotic non-healing left heel ulcer. PMHx: T2DM, HLD, HTN, cervical and rectal CA s/p colostomy, PAD s/p  left common iliac artery stenting (05/30/24), neuropathy, diabetic foot ulcer, hypothyroidism, arthritis, and anemia.    PT Comments  Pt with gradual progress but still requiring +2 for transfers.  Pt with limited strength and ROM in R LE that limited mobility.  She tolerated L BKA exercises well and demonstrates fair ROM (~5 degrees to 50 degrees).  Cont POC.  Patient will benefit from continued inpatient follow up therapy, <3 hours/day t d/c     If plan is discharge home, recommend the following: Two people to help with walking and/or transfers;Two people to help with bathing/dressing/bathroom;Assistance with cooking/housework;Assist for transportation;Help with stairs or ramp for entrance   Can travel by private vehicle     No  Equipment Recommendations  Rolling walker (2 wheels) (has w/c, and per notes purchasing hoyer)    Recommendations for Other Services       Precautions / Restrictions Precautions Precautions: Fall Required Braces or Orthoses: Other Brace Other Brace: Limb Guard LLE Restrictions LLE Weight Bearing Per Provider Order: Non weight bearing     Mobility  Bed Mobility Overal bed mobility: Needs Assistance Bed Mobility: Supine to Sit     Supine to sit: Mod assist, +2 for physical assistance     General bed mobility comments: Increased time and cues with assist for trunk and scooting forward.    Transfers Overall transfer level: Needs assistance Equipment used: Rolling walker (2 wheels) Transfers: Sit to/from Stand Sit to Stand: Max assist, +2 physical assistance Stand pivot transfers: Max assist, +2 physical assistance          General transfer comment: Pt supporting some weight in R LE but heavy max x 2 for pivot with chair arms down    Ambulation/Gait               General Gait Details: Unable   Stairs             Wheelchair Mobility     Tilt Bed    Modified Rankin (Stroke Patients Only)       Balance Overall balance assessment: Needs assistance Sitting-balance support: Feet supported, No upper extremity supported, Bilateral upper extremity supported Sitting balance-Leahy Scale: Fair Sitting balance - Comments: Initially bil UE with posterior lean but with cues, reaching forward to target , and increased time progressed to close supervision.  Sat EOB 8 mins     Standing balance-Leahy Scale: Zero Standing balance comment: Pt dependent on RW and required max- x2; stood <15 sec                            Communication    Cognition Arousal: Alert Behavior During Therapy: WFL for tasks assessed/performed   PT - Cognitive impairments: Awareness, Attention, Initiation, Sequencing, Problem solving, Safety/Judgement, Orientation   Orientation impairments: Time                     Following commands: Impaired Following commands impaired: Only follows one step commands consistently    Cueing Cueing Techniques: Verbal cues, Gestural cues, Visual cues  Exercises Amputee Exercises Quad Sets: AROM, Both, 10 reps, Supine Hip ABduction/ADduction: AROM, Both,  10 reps, Supine Knee Flexion: AROM, Both, 10 reps, Seated (With manual stretch 5 sec each rep) Knee Extension: AROM, Both, 20 reps, Seated (AROM on L and min resistance on the R) Straight Leg Raises: AROM, Left, 10 reps, Supine    General Comments        Pertinent Vitals/Pain Pain Assessment Pain Assessment: Faces Faces Pain Scale: Hurts little more Pain Location: bottom and L LE Pain Descriptors / Indicators: Operative site guarding, Guarding, Discomfort, Aching, Sore Pain Intervention(s): Limited  activity within patient's tolerance, Monitored during session, Premedicated before session, Repositioned    Home Living                          Prior Function            PT Goals (current goals can now be found in the care plan section) Progress towards PT goals: Progressing toward goals    Frequency    Min 2X/week      PT Plan      Co-evaluation              AM-PAC PT 6 Clicks Mobility   Outcome Measure  Help needed turning from your back to your side while in a flat bed without using bedrails?: A Lot Help needed moving from lying on your back to sitting on the side of a flat bed without using bedrails?: A Lot         6 Click Score: 4    End of Session Equipment Utilized During Treatment: Gait belt;Other (comment) (limb protector) Activity Tolerance: Patient tolerated treatment well Patient left: with call bell/phone within reach;with chair alarm set;in chair Nurse Communication: Mobility status (max x 2 transfer - lift vs lateral scoot) PT Visit Diagnosis: Pain;Difficulty in walking, not elsewhere classified (R26.2);Unsteadiness on feet (R26.81);Other abnormalities of gait and mobility (R26.89) Pain - Right/Left: Left Pain - part of body: Knee;Leg     Time: 1414-1450 PT Time Calculation (min) (ACUTE ONLY): 36 min  Charges:    $Therapeutic Exercise: 8-22 mins $Therapeutic Activity: 8-22 mins PT General Charges $$ ACUTE PT VISIT: 1 Visit                     Benjiman, PT Acute Rehab [facility] [phone]    Benjiman VEAR Mulberry 07/18/2024, 3:01 PM

## 2024-07-18 NOTE — Progress Notes (Signed)
 Med Rec completed for discharge packet.  Authorization still pending.

## 2024-07-18 NOTE — Progress Notes (Signed)
 Patient bed exit alarmed. I went into the room to find her sitting on the edge of the bed. I offered to help her get back into the bed, she refused stating that her husband is outside and she has to open the door for him. Patient is alert and oriented to self and time only. I tried to reorient her but she told me to stop correcting her and help her walk to the window. I explained that it is not safe to ambulate as she has a BKA (07/12/2024). I called for the NA to assist me. PRN anxiety med pulled from pyxsis. Patient refused to take medication. She stated that she wanted to see her husband first. On call provider notified. Order later placed for IV medication. Charge nurse called for assistance. She starting calling her daughter and husband repeatedly from her cell phone. When she could not reach them she became tearful and began breathing shallow. Pulse ox applied. O2 84 on room air. 2L of O2 applied. I asked her to take slow deep breaths and O2 came up to 88. Oxygen increased to 95 on 3L. She called her daughter again and asked her to pick her up. The daughter called this nurse and stated that her mother needs assistance. I explained to her that the NA, charge nurse, and myself are in the room with her mother. She said, yes now, make sure my mother is comfortable and disconnected the call. I called her back and explained to her that we have been in her mother's room for about an hour to ensure that she didn't get out of the bed. She stated that she should be medicated and I informed her that she was refusing to take her medicine. She called her mother and told her to take her medication. Oral PRN medication given at 0059.

## 2024-07-18 NOTE — Plan of Care (Signed)
  Problem: Education: Goal: Knowledge of General Education information will improve Description: Including pain rating scale, medication(s)/side effects and non-pharmacologic comfort measures Outcome: Progressing   Problem: Health Behavior/Discharge Planning: Goal: Ability to manage health-related needs will improve Outcome: Progressing   Problem: Clinical Measurements: Goal: Ability to maintain clinical measurements within normal limits will improve Outcome: Progressing Goal: Will remain free from infection Outcome: Progressing Goal: Diagnostic test results will improve Outcome: Progressing Goal: Respiratory complications will improve Outcome: Progressing Goal: Cardiovascular complication will be avoided Outcome: Progressing   Problem: Activity: Goal: Risk for activity intolerance will decrease Outcome: Progressing   Problem: Nutrition: Goal: Adequate nutrition will be maintained Outcome: Progressing   Problem: Coping: Goal: Level of anxiety will decrease Outcome: Progressing   Problem: Elimination: Goal: Will not experience complications related to bowel motility Outcome: Progressing Goal: Will not experience complications related to urinary retention Outcome: Progressing   Problem: Pain Managment: Goal: General experience of comfort will improve and/or be controlled Outcome: Progressing   Problem: Safety: Goal: Ability to remain free from injury will improve Outcome: Progressing   Problem: Skin Integrity: Goal: Risk for impaired skin integrity will decrease Outcome: Progressing   Problem: Education: Goal: Ability to describe self-care measures that may prevent or decrease complications (Diabetes Survival Skills Education) will improve Outcome: Progressing Goal: Individualized Educational Video(s) Outcome: Progressing   Problem: Coping: Goal: Ability to adjust to condition or change in health will improve Outcome: Progressing   Problem: Fluid  Volume: Goal: Ability to maintain a balanced intake and output will improve Outcome: Progressing   Problem: Health Behavior/Discharge Planning: Goal: Ability to identify and utilize available resources and services will improve Outcome: Progressing Goal: Ability to manage health-related needs will improve Outcome: Progressing   Problem: Metabolic: Goal: Ability to maintain appropriate glucose levels will improve Outcome: Progressing   Problem: Nutritional: Goal: Maintenance of adequate nutrition will improve Outcome: Progressing Goal: Progress toward achieving an optimal weight will improve Outcome: Progressing   Problem: Skin Integrity: Goal: Risk for impaired skin integrity will decrease Outcome: Progressing   Problem: Tissue Perfusion: Goal: Adequacy of tissue perfusion will improve Outcome: Progressing   Problem: Clinical Measurements: Goal: Ability to avoid or minimize complications of infection will improve Outcome: Progressing   Problem: Skin Integrity: Goal: Skin integrity will improve Outcome: Progressing   Problem: Education: Goal: Knowledge of the prescribed therapeutic regimen will improve Outcome: Progressing Goal: Ability to verbalize activity precautions or restrictions will improve Outcome: Progressing Goal: Understanding of discharge needs will improve Outcome: Progressing   Problem: Activity: Goal: Ability to perform//tolerate increased activity and mobilize with assistive devices will improve Outcome: Progressing   Problem: Clinical Measurements: Goal: Postoperative complications will be avoided or minimized Outcome: Progressing   Problem: Self-Care: Goal: Ability to meet self-care needs will improve Outcome: Progressing   Problem: Self-Concept: Goal: Ability to maintain and perform role responsibilities to the fullest extent possible will improve Outcome: Progressing   Problem: Pain Management: Goal: Pain level will decrease with  appropriate interventions Outcome: Progressing   Problem: Skin Integrity: Goal: Demonstration of wound healing without infection will improve Outcome: Progressing

## 2024-07-18 NOTE — TOC Progression Note (Signed)
 Transition of Care [facility]) - Progression Note    Patient Details  Name: [name] MRN: [id number] Date of Birth: 1945/11/19  Transition of Care [facility]) CM/SW Contact  Bridget Cordella Simmonds, LCSW Phone Number: 07/18/2024, 8:56 AM  Clinical Narrative:   SNF auth request remains pending.     Expected Discharge Plan: Skilled Nursing Facility Barriers to Discharge: Continued Medical Work up               Expected Discharge Plan and Services In-house Referral: Clinical Social Work   Post Acute Care Choice: Skilled Nursing Facility Living arrangements for the past 2 months: Single Family Home Expected Discharge Date: 07/17/24                                     Social Drivers of Health (SDOH) Interventions SDOH Screenings   Food Insecurity: No Food Insecurity (07/12/2024)  Housing: Low Risk  (07/12/2024)  Transportation Needs: No Transportation Needs (07/12/2024)  Utilities: Not At Risk (07/12/2024)  Depression (PHQ2-9): Low Risk  (06/23/2024)  Social Connections: Unknown (07/12/2024)  Tobacco Use: Medium Risk (07/12/2024)    Readmission Risk Interventions    10/15/2023    9:49 AM 09/11/2023   11:36 AM 12/02/2021    1:55 PM  Readmission Risk Prevention Plan  Transportation Screening Complete Complete Complete  PCP or Specialist Appt within 3-5 Days  Complete   HRI or Home Care Consult  Complete   Social Work Consult for Recovery Care Planning/Counseling  Complete   Palliative Care Screening  Not Applicable   Medication Review Oceanographer) Referral to Pharmacy Complete Complete  PCP or Specialist appointment within 3-5 days of discharge Complete  Complete  HRI or Home Care Consult Complete  Complete  SW Recovery Care/Counseling Consult Complete  Complete  Palliative Care Screening Not Applicable  Not Applicable  Skilled Nursing Facility Not Applicable  Patient Refused

## 2024-07-18 NOTE — Plan of Care (Signed)
  Problem: Education: Goal: Knowledge of General Education information will improve Description: Including pain rating scale, medication(s)/side effects and non-pharmacologic comfort measures Outcome: Progressing   Problem: Health Behavior/Discharge Planning: Goal: Ability to manage health-related needs will improve Outcome: Progressing   Problem: Clinical Measurements: Goal: Ability to maintain clinical measurements within normal limits will improve Outcome: Progressing Goal: Will remain free from infection Outcome: Progressing   Problem: Activity: Goal: Risk for activity intolerance will decrease Outcome: Progressing   Problem: Nutrition: Goal: Adequate nutrition will be maintained Outcome: Progressing   Problem: Pain Managment: Goal: General experience of comfort will improve and/or be controlled Outcome: Progressing   Problem: Safety: Goal: Ability to remain free from injury will improve Outcome: Progressing

## 2024-07-19 DIAGNOSIS — L97429 Non-pressure chronic ulcer of left heel and midfoot with unspecified severity: Secondary | ICD-10-CM | POA: Diagnosis not present

## 2024-07-19 LAB — BASIC METABOLIC PANEL WITH GFR
Anion gap: 10 (ref 5–15)
BUN: 13 mg/dL (ref 8–23)
CO2: 25 mmol/L (ref 22–32)
Calcium: 8.4 mg/dL — ABNORMAL LOW (ref 8.9–10.3)
Chloride: 103 mmol/L (ref 98–111)
Creatinine, Ser: 0.75 mg/dL (ref 0.44–1.00)
GFR, Estimated: >60 mL/min (ref >60)
Glucose, Bld: 105 mg/dL — ABNORMAL HIGH (ref 70–99)
Potassium: 4.3 mmol/L (ref 3.5–5.1)
Sodium: 138 mmol/L (ref 135–145)

## 2024-07-19 LAB — URINALYSIS, ROUTINE W REFLEX MICROSCOPIC
Bilirubin Urine: NEGATIVE
Glucose, UA: NEGATIVE mg/dL
Ketones, ur: NEGATIVE mg/dL
Nitrite: NEGATIVE
Protein, ur: 30 mg/dL — AB
Specific Gravity, Urine: 1.004 — ABNORMAL LOW (ref 1.005–1.030)
pH: 5 (ref 5.0–8.0)

## 2024-07-19 LAB — GLUCOSE, CAPILLARY
Glucose-Capillary: 74 mg/dL (ref 70–99)
Glucose-Capillary: 130 mg/dL — ABNORMAL HIGH (ref 70–99)
Glucose-Capillary: 133 mg/dL — ABNORMAL HIGH (ref 70–99)
Glucose-Capillary: 119 mg/dL — ABNORMAL HIGH (ref 70–99)

## 2024-07-19 LAB — MAGNESIUM: Magnesium: 2 mg/dL (ref 1.7–2.4)

## 2024-07-19 LAB — PHOSPHORUS: Phosphorus: 3.7 mg/dL (ref 2.5–4.6)

## 2024-07-19 MED ORDER — CEPHALEXIN 500 MG PO CAPS
500.0000 mg | ORAL_CAPSULE | Freq: Three times a day (TID) | ORAL | Status: AC
  Administered 2024-07-19 – 2024-07-24 (×15): 500 mg via ORAL
  Filled 2024-07-19 (×15): qty 1

## 2024-07-19 MED ORDER — CEPHALEXIN 500 MG PO CAPS: 500.0000 mg | ORAL_CAPSULE | Freq: Three times a day (TID) | ORAL | Status: AC

## 2024-07-19 NOTE — Plan of Care (Signed)
  Problem: Education: Goal: Knowledge of General Education information will improve Description: Including pain rating scale, medication(s)/side effects and non-pharmacologic comfort measures Outcome: Progressing   Problem: Health Behavior/Discharge Planning: Goal: Ability to manage health-related needs will improve Outcome: Progressing   Problem: Clinical Measurements: Goal: Ability to maintain clinical measurements within normal limits will improve Outcome: Progressing Goal: Will remain free from infection Outcome: Progressing Goal: Diagnostic test results will improve Outcome: Progressing Goal: Respiratory complications will improve Outcome: Progressing Goal: Cardiovascular complication will be avoided Outcome: Progressing   Problem: Activity: Goal: Risk for activity intolerance will decrease Outcome: Progressing   Problem: Nutrition: Goal: Adequate nutrition will be maintained Outcome: Progressing   Problem: Coping: Goal: Level of anxiety will decrease Outcome: Progressing   Problem: Elimination: Goal: Will not experience complications related to bowel motility Outcome: Progressing Goal: Will not experience complications related to urinary retention Outcome: Progressing   Problem: Pain Managment: Goal: General experience of comfort will improve and/or be controlled Outcome: Progressing   Problem: Safety: Goal: Ability to remain free from injury will improve Outcome: Progressing   Problem: Skin Integrity: Goal: Risk for impaired skin integrity will decrease Outcome: Progressing   Problem: Education: Goal: Ability to describe self-care measures that may prevent or decrease complications (Diabetes Survival Skills Education) will improve Outcome: Progressing Goal: Individualized Educational Video(s) Outcome: Progressing   Problem: Coping: Goal: Ability to adjust to condition or change in health will improve Outcome: Progressing   Problem: Fluid  Volume: Goal: Ability to maintain a balanced intake and output will improve Outcome: Progressing   Problem: Health Behavior/Discharge Planning: Goal: Ability to identify and utilize available resources and services will improve Outcome: Progressing Goal: Ability to manage health-related needs will improve Outcome: Progressing   Problem: Metabolic: Goal: Ability to maintain appropriate glucose levels will improve Outcome: Progressing   Problem: Nutritional: Goal: Maintenance of adequate nutrition will improve Outcome: Progressing Goal: Progress toward achieving an optimal weight will improve Outcome: Progressing   Problem: Skin Integrity: Goal: Risk for impaired skin integrity will decrease Outcome: Progressing   Problem: Tissue Perfusion: Goal: Adequacy of tissue perfusion will improve Outcome: Progressing   Problem: Clinical Measurements: Goal: Ability to avoid or minimize complications of infection will improve Outcome: Progressing   Problem: Skin Integrity: Goal: Skin integrity will improve Outcome: Progressing   Problem: Education: Goal: Knowledge of the prescribed therapeutic regimen will improve Outcome: Progressing Goal: Ability to verbalize activity precautions or restrictions will improve Outcome: Progressing Goal: Understanding of discharge needs will improve Outcome: Progressing   Problem: Activity: Goal: Ability to perform//tolerate increased activity and mobilize with assistive devices will improve Outcome: Progressing   Problem: Clinical Measurements: Goal: Postoperative complications will be avoided or minimized Outcome: Progressing   Problem: Self-Care: Goal: Ability to meet self-care needs will improve Outcome: Progressing   Problem: Self-Concept: Goal: Ability to maintain and perform role responsibilities to the fullest extent possible will improve Outcome: Progressing   Problem: Pain Management: Goal: Pain level will decrease with  appropriate interventions Outcome: Progressing   Problem: Skin Integrity: Goal: Demonstration of wound healing without infection will improve Outcome: Progressing

## 2024-07-19 NOTE — TOC Progression Note (Addendum)
 Transition of Care [facility] - Progression Note    Patient Details  Name: BLAKELYNN SCHEELER MRN: [id number] Date of Birth: 21-May-1946  Transition of Care [facility]) CM/SW Contact  Bridget Cordella Simmonds, LCSW Phone Number: 07/19/2024, 3:24 PM  Clinical Narrative:   Per CMA Janell, P2P offered for SNF auth: MD call [id number] opt 3, will need member ID: [id number].  MD informed.   1400: Per MD, attempted P2P, long hold, unable to reach anyone.   More info from Janell: P2P deadline at noon today (already past)  auth denied.  appeals phone number is [phone].   Expected Discharge Plan: Skilled Nursing Facility Barriers to Discharge: Continued Medical Work up               Expected Discharge Plan and Services In-house Referral: Clinical Social Work   Post Acute Care Choice: Skilled Nursing Facility Living arrangements for the past 2 months: Single Family Home Expected Discharge Date: 07/17/24                                     Social Drivers of Health (SDOH) Interventions SDOH Screenings   Food Insecurity: No Food Insecurity (07/12/2024)  Housing: Low Risk  (07/12/2024)  Transportation Needs: No Transportation Needs (07/12/2024)  Utilities: Not At Risk (07/12/2024)  Depression (PHQ2-9): Low Risk  (06/23/2024)  Social Connections: Unknown (07/12/2024)  Tobacco Use: Medium Risk (07/12/2024)    Readmission Risk Interventions    10/15/2023    9:49 AM 09/11/2023   11:36 AM 12/02/2021    1:55 PM  Readmission Risk Prevention Plan  Transportation Screening Complete Complete Complete  PCP or Specialist Appt within 3-5 Days  Complete   HRI or Home Care Consult  Complete   Social Work Consult for Recovery Care Planning/Counseling  Complete   Palliative Care Screening  Not Applicable   Medication Review Oceanographer) Referral to Pharmacy Complete Complete  PCP or Specialist appointment within 3-5 days of discharge Complete  Complete  HRI or Home Care Consult Complete   Complete  SW Recovery Care/Counseling Consult Complete  Complete  Palliative Care Screening Not Applicable  Not Applicable  Skilled Nursing Facility Not Applicable  Patient Refused

## 2024-07-19 NOTE — Discharge Summary (Addendum)
 Physician Discharge Summary  [name] FMW:[id number] DOB: 04-15-1946 DOA: 07/12/2024  PCP: Waylan Almarie SAUNDERS, MD  Admit date: 07/12/2024 Discharge date: 07/19/2024  Admitted From: Home Disposition: SNF  Recommendations for Outpatient Follow-up:  Follow up with PCP in 1-2 weeks Repeat blood work including electrolytes in 3-5 days Follow-up outpatient vascular Pain medications and bowel regimen has been prescribed 5 days PO keflex  for UTI   Discharge Condition: Stable CODE STATUS: Full code Diet recommendation: Diabetic   Brief Narrative:   78 y.o. female with past medical history  of   T2DM, HLD, HTN, cervical and rectal cancer s/p colostomy, diabetic foot ulcer, neuropathy, hypothyroidism, hypomagnesemia, anemia and arthritis,   PAD left lower extremity angiogram with left common iliac artery stenting on 05/30/2024 by Dr. Sheree for critical limb ischemia for left heel ulceration , seen in clinic at vascular for left heel ulcer and foul smelling drainage, and pt was advised for left BKA.Pt was scheduled for Necrotic left heel ulcer bka.  Since her recent admission and discharge in July for same pt's ulcer had not healed, rather gotten worse.  Pt seen in PACU and no HPI. Very somnolent and sedated ahs not recovered from anesthesia.   Upon admission she slowly improved.  She was eventually cleared by vascular surgery for discharge, home Plavix  was resumed.  PT/OT recommended SNF.  Due to some drop in hemoglobin required 1 unit PRBC transfusion on 8/28.  Family updated during the hospitalization.  SNF placement.  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 -Pain control and bowel regimen.  Resume Plavix  and statin -No further need for antibiotics, discussed with vascular  Mechanical fall with recent left femur  fracture -Patient had a fall requiring ORIF in July 2025.  Recovering as expected.  Continue to closely monitor  Diabetes mellitus type 2, uncontrolled due to hyperglycemia -Sliding scale and Accu-Chek. On semglee , resume metformin  upon dc  Hypomagnesemia/HypoKalemia -Aggressive repletion.  Will prescribe mag oxide upon discharge due to recurrent hypomagnesemia despite of normal potassium and phosphorus.  Recheck electrolytes again in next 3-5 days at SNF  Essential hypertension -IV as needed  History of cervical/rectal cancer status post colectomy -Follows outpatient oncology team  Anemia of chronic disease/ - Baseline hemoglobin around 10.0, postop drifted down.  S/p 1U Prbc  8/28, now stable.   Hyperlipidemia -Lipitor  Hypothyroidism -Synthroid   GERD -PPI  Mood disorder -Zoloft   TOC consulted for SNF placement  DVT prophylaxis: Subcu heparin     Code Status: Full Code Family Communication: Son and husband at bedside Status is: Inpatient Remains inpatient appropriate because SNF placement   PT Follow up Recs: Skilled Nursing-Short Term Rehab (<3 Hours/Day)07/13/2024 1200  Subjective: No complaints awaiting SNF placement  Examination:  General exam: Appears calm and comfortable  Respiratory system: Clear to auscultation. Respiratory effort normal. Cardiovascular system: S1 & S2 heard, RRR. No JVD, murmurs, rubs, gallops or clicks. No pedal edema. Gastrointestinal system: Abdomen is nondistended, soft and nontender. No organomegaly or masses felt. Normal bowel sounds heard. Central nervous system: Alert and oriented to her name. No focal neurological deficits. Extremities: Left BKA noted Skin: No rashes, lesions or ulcers Psychiatry: Judgement and insight appear poor    Discharge Diagnoses:  Principal Problem:   Heel ulcer, left, with unspecified severity (HCC)      Discharge Exam: Vitals:   07/19/24 0503  07/19/24 0801  BP: (!) 143/54 (!) 124/40   Pulse: (!) 57 (!) 57  Resp: 18 17  Temp: (!) 97.5 F (36.4 C) 97.6 F (36.4 C)  SpO2: 99%    Vitals:   07/18/24 1508 07/18/24 2014 07/19/24 0503 07/19/24 0801  BP: (!) 123/41 (!) 134/54 (!) 143/54 (!) 124/40  Pulse: 67 70 (!) 57 (!) 57  Resp: 17 16 18 17   Temp: 97.6 F (36.4 C) 97.7 F (36.5 C) (!) 97.5 F (36.4 C) 97.6 F (36.4 C)  TempSrc: Oral Oral Oral Oral  SpO2: 95% 98% 99%   Weight:      Height:          Discharge Instructions   Allergies as of 07/19/2024       Reactions   Codeine Hives, Nausea Only        Medication List     STOP taking these medications    clotrimazole -betamethasone  cream Commonly known as: LOTRISONE    collagenase  250 UNIT/GM ointment Commonly known as: SANTYL    doxycycline  100 MG tablet Commonly known as: ADOXA   enoxaparin  100 MG/ML injection Commonly known as: LOVENOX    FreeStyle Libre 3 Reader [employer] 3 Sensor Misc   lidocaine -prilocaine  cream Commonly known as: EMLA    Nyamyc  powder Generic drug: nystatin    sodium hypochlorite 0.125 % Soln Commonly known as: DAKIN'S 1/4 STRENGTH       TAKE these medications    acetaminophen  500 MG tablet Commonly known as: TYLENOL  Take 500 mg by mouth daily as needed for moderate pain (pain score 4-6) or headache.   ALPRAZolam  0.25 MG tablet Commonly known as: XANAX  Take 1 tablet (0.25 mg total) by mouth every 8 (eight) hours as needed for anxiety.   atorvastatin  40 MG tablet Commonly known as: LIPITOR Take 40 mg by mouth at bedtime.   baclofen  10 MG tablet Commonly known as: LIORESAL  Take 1 tablet (10 mg total) by mouth 2 (two) times daily.   cephALEXin  500 MG capsule Commonly known as: KEFLEX  Take 1 capsule (500 mg total) by mouth every 8 (eight) hours for 5 days.   clopidogrel  75 MG tablet Commonly known as: PLAVIX  Take 75 mg by mouth daily.   cyanocobalamin  1000 MCG tablet Commonly known as: VITAMIN B12 Take 1,000 mcg by mouth daily.    feeding supplement Liqd Take 1 Container by mouth 2 (two) times daily between meals.   ferrous sulfate  325 (65 FE) MG tablet Take 325 mg by mouth every Monday, Wednesday, and Friday.   gabapentin  300 MG capsule Commonly known as: NEURONTIN  Take 300-600 mg by mouth See admin instructions. Take 600 mg by mouth in the morning and 300 mg at bedtime   levothyroxine  150 MCG tablet Commonly known as: SYNTHROID  Take 150 mcg by mouth daily before breakfast.   magnesium  oxide 400 (240 Mg) MG tablet Commonly known as: MAG-OX Take 400 mg by mouth daily.   metFORMIN  750 MG 24 hr tablet Commonly known as: GLUCOPHAGE -XR Take 750 mg by mouth 2 (two) times daily with a meal.   multivitamin tablet Take 1 tablet by mouth daily with breakfast.   NON FORMULARY Take 30 mLs by mouth 2 (two) times daily. Prostate for wound healing   oxyCODONE  10 mg 12 hr tablet Commonly known as: OXYCONTIN  Take 1 tablet (10 mg total) by mouth every 12 (twelve) hours. What changed:  medication strength how much to take   oxyCODONE  5 MG immediate release tablet Commonly known as: Roxicodone  Take  0.5-1 tablets (2.5-5 mg total) by mouth every 4 (four) hours as needed for severe pain (pain score 7-10). What changed: Another medication with the same name was changed. Make sure you understand how and when to take each.   OXYGEN Inhale 2 L into the lungs daily as needed (Sat >90%).   pantoprazole  40 MG tablet Commonly known as: PROTONIX  Take 1 tablet (40 mg total) by mouth daily.   PRESCRIPTION MEDICATION Apply 1 application  topically 3 (three) times daily. House stock barrier cream to buttocks   PRESCRIPTION MEDICATION Take 120 mLs by mouth in the morning, at noon, in the evening, and at bedtime. Offer fluids for hydration and UTI prevention   sertraline  100 MG tablet Commonly known as: ZOLOFT  Take 150 mg by mouth at bedtime.   traMADol  50 MG tablet Commonly known as: ULTRAM  Take 1 tablet (50 mg total)  by mouth every 8 (eight) hours as needed for moderate pain (pain score 4-6).   Vitamin D3 Super Strength 50 MCG (2000 UT) Caps Generic drug: Cholecalciferol  Take 2,000 Units by mouth daily.        Follow-up Information     Waylan Almarie SAUNDERS, MD Follow up.   Specialty: Family Medicine Contact information: [address] [place] [address] [phone]                Allergies  Allergen Reactions   Codeine Hives and Nausea Only    You were cared for by a hospitalist during your hospital stay. If you have any questions about your discharge medications or the care you received while you were in the hospital after you are discharged, you can call the unit and asked to speak with the hospitalist on call if the hospitalist that took care of you is not available. Once you are discharged, your primary care physician will handle any further medical issues. Please note that no refills for any discharge medications will be authorized once you are discharged, as it is imperative that you return to your primary care physician (or establish a relationship with a primary care physician if you do not have one) for your aftercare needs so that they can reassess your need for medications and monitor your lab values.  You were cared for by a hospitalist during your hospital stay. If you have any questions about your discharge medications or the care you received while you were in the hospital after you are discharged, you can call the unit and asked to speak with the hospitalist on call if the hospitalist that took care of you is not available. Once you are discharged, your primary care physician will handle any further medical issues. Please note that NO REFILLS for any discharge medications will be authorized once you are discharged, as it is imperative that you return to your primary care physician (or establish a relationship with a primary care physician if you do not have one) for your  aftercare needs so that they can reassess your need for medications and monitor your lab values.  Please request your Prim.MD to go over all Hospital Tests and Procedure/Radiological results at the follow up, please get all Hospital records sent to your Prim MD by signing hospital release before you go home.  Get CBC, CMP, 2 view Chest X ray checked  by Primary MD during your next visit or SNF MD in 5-7 days ( we routinely change or add medications that can affect your baseline labs and fluid status, therefore we recommend that  you get the mentioned basic workup next visit with your PCP, your PCP may decide not to get them or add new tests based on their clinical decision)  On your next visit with your primary care physician please Get Medicines reviewed and adjusted.  If you experience worsening of your admission symptoms, develop shortness of breath, life threatening emergency, suicidal or homicidal thoughts you must seek medical attention immediately by calling 911 or calling your MD immediately  if symptoms less severe.  You Must read complete instructions/literature along with all the possible adverse reactions/side effects for all the Medicines you take and that have been prescribed to you. Take any new Medicines after you have completely understood and accpet all the possible adverse reactions/side effects.   Do not drive, operate heavy machinery, perform activities at heights, swimming or participation in water activities or provide baby sitting services if your were admitted for syncope or siezures until you have seen by Primary MD or a Neurologist and advised to do so again.  Do not drive when taking Pain medications.   Procedures/Studies: VAS US  AORTA/IVC/ILIACS Result Date: 06/29/2024 ABDOMINAL AORTA STUDY Patient Name:  [name]  Date of Exam:   06/29/2024 Medical Rec #: [id number]         Accession #:    [id number] Date of Birth: Jul 31, 1946         Patient Gender: F Patient Age:    80 years Exam Location:  [facility] Procedure:      VAS US  AORTA/IVC/ILIACS Referring Phys: [name] --------------------------------------------------------------------------------  Indications: Patient is s/p left common iliac stenting for left heel ulceration.              Patient's daughter reports the wound looks marginally better but              feels it needs to be debrided again. Risk Factors: Hypertension, Diabetes, past history of smoking, coronary artery               disease, prior CVA. Vascular Interventions: S/P left common iliac stent 05/30/24. Limitations: Air/bowel gas, obesity, patient discomfort and colostomy bag.  Performing Technologist: Edsel Mustard RVT  Examination Guidelines: A complete evaluation includes B-mode imaging, spectral Doppler, color Doppler, and power Doppler as needed of all accessible portions of each vessel. Bilateral testing is considered an integral part of a complete examination. Limited examinations for reoccurring indications may be performed as noted.  Abdominal Aorta Findings: +-------------+-------+----------+----------+----------+--------+--------------+ Location     AP (cm)Trans (cm)PSV (cm/s)Waveform  ThrombusComments       +-------------+-------+----------+----------+----------+--------+--------------+ Proximal                                                  not visualized +-------------+-------+----------+----------+----------+--------+--------------+ Distal                        136                                        +-------------+-------+----------+----------+----------+--------+--------------+ LT CIA Prox                   132                                        +-------------+-------+----------+----------+----------+--------+--------------+  LT CIA Mid                    183                                        +-------------+-------+----------+----------+----------+--------+--------------+ LT  CIA Distal                 199                                        +-------------+-------+----------+----------+----------+--------+--------------+ LT EIA Prox                   216       monophasic                       +-------------+-------+----------+----------+----------+--------+--------------+ LT EIA Mid                    367       monophasic        >50% stenosis  +-------------+-------+----------+----------+----------+--------+--------------+ LT EIA Distal                 370       monophasic                       +-------------+-------+----------+----------+----------+--------+--------------+ Visualization of the Proximal Abdominal Aorta, Mid Abdominal Aorta, Left CIA Proximal artery, Left CIA Mid artery, Left CIA Distal artery and Left EIA Proximal artery was limited. The left common iliac artery appears patent, s/p stenting. Visualization was very limited due to overlying colostomy bag. The external iliac artery demonstrates >50% stenosis.  Summary: Technically challenging examination Stenosis: +-------------------+-------------+-----------+------------+ Location           Stenosis     Stent      Comments     +-------------------+-------------+-----------+------------+ Left Common Iliac               no stenosispatent stent +-------------------+-------------+-----------+------------+ Left External Iliac>50% stenosis                        +-------------------+-------------+-----------+------------+ The left common iliac artery appears patent, s/p stenting. Visualization was limited due to overlying colostomy bag and bowel gas. >50% stenosis of the left external iliac artery.  *See table(s) above for measurements and observations.  Electronically signed by [name] MD on 06/29/2024 at 4:41:46 PM.    Final    VAS US  ABI WITH/WO TBI Result Date: 06/29/2024  LOWER EXTREMITY DOPPLER STUDY Patient Name:  KIANAH HARRIES  Date of Exam:   06/29/2024 Medical  Rec #: [id number]         Accession #:    [id number] Date of Birth: Mar 08, 1946         Patient Gender: F Patient Age:   60 years Exam Location:  [facility] Procedure:      VAS US  ABI WITH/WO TBI Referring Phys: [name] --------------------------------------------------------------------------------  Indications: Ulceration, and peripheral artery disease. High Risk Factors: Hypertension, Diabetes, past history of smoking, coronary                    artery disease, prior CVA.  Vascular Interventions: S/P left common iliac stent 05/30/24. Comparison Study: ABIs 02/16/24 were  0.85/0.46 on the right and 1.01/absent on the                   left Performing Technologist: Edsel Mustard RVT  Examination Guidelines: A complete evaluation includes at minimum, Doppler waveform signals and systolic blood pressure reading at the level of bilateral brachial, anterior tibial, and posterior tibial arteries, when vessel segments are accessible. Bilateral testing is considered an integral part of a complete examination. Photoelectric Plethysmograph (PPG) waveforms and toe systolic pressure readings are included as required and additional duplex testing as needed. Limited examinations for reoccurring indications may be performed as noted.  ABI Findings: +---------+------------------+-----+---------+--------+ Right    Rt Pressure (mmHg)IndexWaveform Comment  +---------+------------------+-----+---------+--------+ Brachial 175                                      +---------+------------------+-----+---------+--------+ PTA      181               1.03 triphasic         +---------+------------------+-----+---------+--------+ DP       167               0.95 biphasic          +---------+------------------+-----+---------+--------+ Great Toe0                 0.00 Absent            +---------+------------------+-----+---------+--------+ +---------+------------------+-----+----------+-------+ Left     Lt  Pressure (mmHg)IndexWaveform  Comment +---------+------------------+-----+----------+-------+ Brachial 174                                      +---------+------------------+-----+----------+-------+ PTA      151               0.86 monophasic        +---------+------------------+-----+----------+-------+ DP       174               0.99 monophasic        +---------+------------------+-----+----------+-------+ Great Toe76                0.43 Abnormal          +---------+------------------+-----+----------+-------+ +-------+-----------+-----------+------------+------------+ ABI/TBIToday's ABIToday's TBIPrevious ABIPrevious TBI +-------+-----------+-----------+------------+------------+ Right  1.03       0          0.85        0.46         +-------+-----------+-----------+------------+------------+ Left   .99        0.43       1.01        0            +-------+-----------+-----------+------------+------------+  Summary: Right: Resting right ankle-brachial index is within normal range. The right toe-brachial index is abnormal. Left: Resting left ankle-brachial index is within normal range. The left toe-brachial index is abnormal. Although ankle brachial indices are within normal limits (0.95-1.29), arterial Doppler waveforms at the ankle suggest some component of arterial occlusive disease. *See table(s) above for measurements and observations.  Electronically signed by [name] MD on 06/29/2024 at 4:41:10 PM.    Final      The results of significant diagnostics from this hospitalization (including imaging, microbiology, ancillary and laboratory) are listed below for reference.     Microbiology: Recent Results (from the past 240 hours)  Surgical pcr screen     Status: None   Collection Time: 07/12/24  9:25 AM   Specimen: Nasal Swab  Result Value Ref Range Status   MRSA, PCR NEGATIVE NEGATIVE Final   Staphylococcus aureus NEGATIVE NEGATIVE Final    Comment:  (NOTE) The Xpert SA Assay (FDA approved for NASAL specimens in patients 20 years of age and older), is one component of a comprehensive surveillance program. It is not intended to diagnose infection nor to guide or monitor treatment. Performed at [facility], [address] [place], [place] [address]      Labs: BNP (last 3 results) No results for input(s): BNP in the last 8760 hours. Basic Metabolic Panel: Recent Labs  Lab 07/13/24 0242 07/14/24 0015 07/15/24 0349 07/16/24 0301 07/17/24 0309 07/18/24 0218 07/19/24 0208  NA 138 138 137 133*  --   --  138  K 3.8 3.7 3.3* 4.3  --   --  4.3  CL 105 105 103 104  --   --  103  CO2 23 23 23 22   --   --  25  GLUCOSE 168* 176* 161* 138*  --   --  105*  BUN 21 17 13 13   --   --  13  CREATININE 0.77 0.72 0.73 0.73  --   --  0.75  CALCIUM  8.1* 8.0* 7.9* 7.9*  --   --  8.4*  MG 2.1 1.2* 1.7 1.3* 1.9 1.2* 2.0  PHOS 3.7 3.0  --   --   --   --  3.7   Liver Function Tests: Recent Labs  Lab 07/13/24 0242  AST 21  ALT 15  ALKPHOS 96  BILITOT 0.6  PROT 5.3*  ALBUMIN 2.0*   No results for input(s): LIPASE, AMYLASE in the last 168 hours. No results for input(s): AMMONIA in the last 168 hours. CBC: Recent Labs  Lab 07/12/24 1828 07/12/24 1952 07/13/24 0242 07/14/24 0015 07/14/24 1924 07/15/24 0349 07/16/24 0301  WBC 7.0  --  10.3 7.3  --  6.2 4.8  HGB 7.6*   < > 7.5* 6.5* 7.9* 7.8* 7.7*  HCT 25.8*   < > 25.7* 22.6* 25.0* 25.1* 25.4*  MCV 83.2  --  84.3 84.3  --  83.4 85.2  PLT 189  --  218 189  --  163 162   < > = values in this interval not displayed.   Cardiac Enzymes: No results for input(s): CKTOTAL, CKMB, CKMBINDEX, TROPONINI in the last 168 hours. BNP: Invalid input(s): POCBNP CBG: Recent Labs  Lab 07/18/24 1113 07/18/24 1639 07/18/24 2141 07/19/24 0632 07/19/24 1135  GLUCAP 170* 124* 168* 74 130*   D-Dimer No results for input(s): DDIMER in the last 72 hours. Hgb A1c No  results for input(s): HGBA1C in the last 72 hours. Lipid Profile No results for input(s): CHOL, HDL, LDLCALC, TRIG, CHOLHDL, LDLDIRECT in the last 72 hours. Thyroid  function studies No results for input(s): TSH, T4TOTAL, T3FREE, THYROIDAB in the last 72 hours.  Invalid input(s): FREET3 Anemia work up No results for input(s): VITAMINB12, FOLATE, FERRITIN, TIBC, IRON, RETICCTPCT in the last 72 hours. Urinalysis    Component Value Date/Time   COLORURINE STRAW (A) 07/19/2024 0520   APPEARANCEUR CLEAR 07/19/2024 0520   LABSPEC 1.004 (L) 07/19/2024 0520   PHURINE 5.0 07/19/2024 0520   GLUCOSEU NEGATIVE 07/19/2024 0520   HGBUR MODERATE (A) 07/19/2024 0520   BILIRUBINUR NEGATIVE 07/19/2024 0520   KETONESUR NEGATIVE 07/19/2024 0520   PROTEINUR  30 (A) 07/19/2024 0520   UROBILINOGEN 0.2 05/27/2014 1203   NITRITE NEGATIVE 07/19/2024 0520   LEUKOCYTESUR SMALL (A) 07/19/2024 0520   Sepsis Labs Recent Labs  Lab 07/13/24 0242 07/14/24 0015 07/15/24 0349 07/16/24 0301  WBC 10.3 7.3 6.2 4.8   Microbiology Recent Results (from the past 240 hours)  Surgical pcr screen     Status: None   Collection Time: 07/12/24  9:25 AM   Specimen: Nasal Swab  Result Value Ref Range Status   MRSA, PCR NEGATIVE NEGATIVE Final   Staphylococcus aureus NEGATIVE NEGATIVE Final    Comment: (NOTE) The Xpert SA Assay (FDA approved for NASAL specimens in patients 82 years of age and older), is one component of a comprehensive surveillance program. It is not intended to diagnose infection nor to guide or monitor treatment. Performed at [facility], [address] [place], [place] [address]      Time coordinating discharge:  I have spent 35 minutes face to face with the patient and on the ward discussing the patients care, assessment, plan and disposition with other care givers. >50% of the time was devoted counseling the patient about the risks and benefits of  treatment/Discharge disposition and coordinating care.   SIGNED:   Burgess JAYSON Dare, MD  [facility] 07/19/2024, 11:39 AM   If 7PM-7AM, please contact night-coverage

## 2024-07-19 NOTE — Progress Notes (Signed)
 Occupational Therapy Treatment Patient Details Name: [name] MRN: [id number] DOB: [birth year] Today's Date: 07/19/2024   History of present illness [name] is a 78 y.o. female admitted 07/12/24 for planned L BKA d/t necrotic non-healing left heel ulcer. PMHx: T2DM, HLD, HTN, cervical and rectal CA s/p colostomy, PAD s/p  left common iliac artery stenting (05/30/24), neuropathy, diabetic foot ulcer, hypothyroidism, arthritis, and anemia.   OT comments  Pt received in chair and agreeable for transfer training via sliding board for back to bed. Pt limited by impaired core strength and ongoing minor confusion. Overall, pt required Max A x 2 for sliding board (slightly uphill) back to bed. Pt declined ADL retraining this afternoon, able to demonstrate brief amputee exercises in bed but noted continued difficulty with L knee flexion. Patient will benefit from continued inpatient follow up therapy, <3 hours/day at DC.      If plan is discharge home, recommend the following:  Two people to help with walking and/or transfers;Two people to help with bathing/dressing/bathroom;Assistance with cooking/housework;Direct supervision/assist for medications management;Direct supervision/assist for financial management;Assist for transportation;Help with stairs or ramp for entrance;Supervision due to cognitive status   Equipment Recommendations  Wheelchair (measurements OT);Wheelchair cushion (measurements OT);Other (comment) (TBD pending progression)    Recommendations for Other Services      Precautions / Restrictions Precautions Precautions: Fall Recall of Precautions/Restrictions: Impaired Precaution/Restrictions Comments: ostomy Required Braces or Orthoses: Other Brace Other Brace: Limb Guard LLE Restrictions Weight Bearing Restrictions Per Provider Order: Yes LLE Weight Bearing Per Provider Order: Non weight bearing       Mobility Bed Mobility Overal bed mobility: Needs  Assistance Bed Mobility: Sit to Supine, Rolling Rolling: Supervision     Sit to supine: Mod assist   General bed mobility comments: Able to guide trunk to supine but Mod A for LE back to bed. able to roll to R side using bedrail with Supervision (requesting assessment of bottom due to pain)    Transfers Overall transfer level: Needs assistance Equipment used: Sliding board Transfers: Bed to chair/wheelchair/BSC            Lateral/Scoot Transfers: Max assist, +2 safety/equipment, With slide board General transfer comment: Max A x 2 to scoot to bed along sliding board - slightly uphill so increased assist needed. Cues to lean forward and give therapist a hug to prevent posterior bias w/ fair carryover     Balance Overall balance assessment: Needs assistance Sitting-balance support: Feet supported, No upper extremity supported, Bilateral upper extremity supported Sitting balance-Leahy Scale: Poor Sitting balance - Comments: reliant on UE support EOB Postural control: Posterior lean                                 ADL either performed or assessed with clinical judgement   ADL Overall ADL's : Needs assistance/impaired                                       General ADL Comments: Emphasis on sliding board transfer back to bed and sitting balance, as well as amputee exercises for LLE    Extremity/Trunk Assessment Upper Extremity Assessment Upper Extremity Assessment: Generalized weakness;Right hand dominant   Lower Extremity Assessment Lower Extremity Assessment: Defer to PT evaluation        Vision   Vision Assessment?: No apparent visual deficits  Perception     Praxis     Communication Communication Communication: No apparent difficulties   Cognition Arousal: Alert Behavior During Therapy: WFL for tasks assessed/performed Cognition: Cognition impaired     Awareness: Intellectual awareness intact, Online awareness  impaired Memory impairment (select all impairments): Short-term memory, Working [employer] fast streamer, Conservation officer, historic buildings Attention impairment (select first level of impairment): Selective attention Executive functioning impairment (select all impairments): Organization, Reasoning, Problem solving OT - Cognition Comments: initially reporting spouse had assisted her to the chair (though NT had), some decreased overall awarenes and benefits from education and encouragement to participate                 Following commands: Impaired Following commands impaired: Only follows one step commands consistently      Cueing   Cueing Techniques: Verbal cues, Gestural cues, Visual cues  Exercises Exercises: Amputee Amputee Exercises Hip ABduction/ADduction: Left, 5 reps Knee Flexion: Left, 5 reps Knee Extension: Left, 5 reps Straight Leg Raises: Left, 5 reps    Shoulder Instructions       General Comments      Pertinent Vitals/ Pain       Pain Assessment Pain Assessment: Faces Faces Pain Scale: Hurts little more Pain Location: bottom > LLE Pain Descriptors / Indicators: Grimacing, Guarding, Sore Pain Intervention(s): Monitored during session, Repositioned  Home Living                                          Prior Functioning/Environment              Frequency  Min 2X/week        Progress Toward Goals  OT Goals(current goals can now be found in the care plan section)  Progress towards OT goals: Progressing toward goals  Acute Rehab OT Goals Patient Stated Goal: for bottom to stop hurting OT Goal Formulation: With patient/family Time For Goal Achievement: 07/27/24 Potential to Achieve Goals: Fair ADL Goals Pt Will Perform Upper Body Bathing: with supervision;sitting Pt Will Perform Lower Body Bathing: with mod assist;with adaptive equipment;sitting/lateral leans;sit to/from stand Pt Will Perform Lower Body Dressing: with mod assist;with adaptive  equipment;sitting/lateral leans;sit to/from stand Pt Will Transfer to Toilet: with mod assist;ambulating;bedside commode Pt Will Perform Toileting - Clothing Manipulation and hygiene: with mod assist;sitting/lateral leans Pt/caregiver will Perform Home Exercise Program: Increased strength;Both right and left upper extremity;With theraband;With written HEP provided;With Supervision  Plan      Co-evaluation                 AM-PAC OT 6 Clicks Daily Activity     Outcome Measure   Help from another person eating meals?: A Little Help from another person taking care of personal grooming?: A Little Help from another person toileting, which includes using toliet, bedpan, or urinal?: Total Help from another person bathing (including washing, rinsing, drying)?: A Lot Help from another person to put on and taking off regular upper body clothing?: A Little Help from another person to put on and taking off regular lower body clothing?: Total 6 Click Score: 13    End of Session    OT Visit Diagnosis: Other abnormalities of gait and mobility (R26.89);Unsteadiness on feet (R26.81);History of falling (Z91.81);Muscle weakness (generalized) (M62.81);Other symptoms and signs involving cognitive function;Pain Pain - Right/Left: Left Pain - part of body: Leg   Activity Tolerance Patient tolerated treatment well;Patient limited by  fatigue   Patient Left in bed;with call bell/phone within reach;with bed alarm set   Nurse Communication          Time: 8683-8663 OT Time Calculation (min): 20 min  Charges: OT General Charges $OT Visit: 1 Visit OT Treatments $Therapeutic Activity: 8-22 mins  Mliss NOVAK, OTR/L Acute Rehab Services Office: [phone]   Mliss Fish 07/19/2024, 1:49 PM

## 2024-07-19 NOTE — Consult Note (Addendum)
 WOC Nurse ostomy consult note Need for ostomy supplies requested by nursing staff Stoma type/location: LLQ established colostomy Assessment showing breach at 3 o'clock with cake panning over entire baseplate upon removal, patient c/o skin being irritated, recommend to begin use of a 1-piece appliance instead. Stomal assessment/size: 21 mm below skin level, deep crease line, when skin in not pulled upward stoma can not be visualized  Peristomal assessment: erythema  Treatment options for stomal/peristomal skin:  Output  approx 150 mls Ostomy pouching: 1pc + 1 barrier ring + crusting technique, appliance was well-adhered and had patient hold her hand over appliance for 5 minutes  Education provided: frequency of change out reviewed with patient, she stated its typically changed out every 7 days, answered all her questions  Supplies:  #[id number] Convex 1-piece and [phone] Barrier Ring, #6 stoma powder and blue and white packet no sting barrier wipes for crusting  Crusting Method for around stoma If skin is denuded/red around stoma crust as follows:  Sprinkle stoma powder Soila #6) over any irritated skin, brush away excess powder Tap lightly over the powder with Cavilon No-Sting barrier wipe (skin barrier wipe-white and blue package)  Allow to dry well  Apply another layer of powder following the same steps up to three layers, if needed.  After dry proceed with routine pouching    WOC will not follow and will remove patient from census task list. Please reconsult if wound worsens in condition and notify provider.   Sherrilyn Hals MSN RN CWOCN WOC [facility]  [phone] (Available from 7-3 pm Mon-Friday)

## 2024-07-20 DIAGNOSIS — L97429 Non-pressure chronic ulcer of left heel and midfoot with unspecified severity: Secondary | ICD-10-CM | POA: Diagnosis not present

## 2024-07-20 LAB — BASIC METABOLIC PANEL WITH GFR
Anion gap: 12 (ref 5–15)
BUN: 14 mg/dL (ref 8–23)
CO2: 25 mmol/L (ref 22–32)
Calcium: 8.2 mg/dL — ABNORMAL LOW (ref 8.9–10.3)
Chloride: 106 mmol/L (ref 98–111)
Creatinine, Ser: 0.79 mg/dL (ref 0.44–1.00)
GFR, Estimated: >60 mL/min (ref >60)
Glucose, Bld: 92 mg/dL (ref 70–99)
Potassium: 4 mmol/L (ref 3.5–5.1)
Sodium: 143 mmol/L (ref 135–145)

## 2024-07-20 LAB — GLUCOSE, CAPILLARY
Glucose-Capillary: 74 mg/dL (ref 70–99)
Glucose-Capillary: 133 mg/dL — ABNORMAL HIGH (ref 70–99)
Glucose-Capillary: 91 mg/dL (ref 70–99)
Glucose-Capillary: 196 mg/dL — ABNORMAL HIGH (ref 70–99)

## 2024-07-20 LAB — CALCIUM, IONIZED: Calcium, Ionized, Serum: 4.8 mg/dL (ref 4.5–5.6)

## 2024-07-20 LAB — MAGNESIUM: Magnesium: 1.5 mg/dL — ABNORMAL LOW (ref 1.7–2.4)

## 2024-07-20 MED ORDER — MAGNESIUM SULFATE 2 GM/50ML IV SOLN
2.0000 g | Freq: Once | INTRAVENOUS | Status: AC
  Administered 2024-07-20: 2 g via INTRAVENOUS
  Filled 2024-07-20: qty 50

## 2024-07-20 NOTE — Progress Notes (Signed)
  Progress Note   Patient: [name] FMW:[id number] DOB: 1946/09/28 DOA: 07/12/2024     8 DOS: the patient was seen and examined on 07/20/2024 at 8:44AM      Brief hospital course: 78 y.o. F with DM, HTN, MO, cervical and rectal cancer s/p colostomy, diabetic foot ulcer, neuropathy, and hypothyroidism who presented for critical limb ischemia with left heel ulceration and nonhealing heel wound.    Admitted and underwent BKA.  Required 1 unit PRBC transfusion on 8/28 for post-operative blood loss anemia.        Assessment and Plan: Left heel ulcer Gangrene status post left BKA Peripheral vascular disease -Continue Lipitor, Plavix  - Continue analgesics - Continue cephalexin    Recent left femur fracture -PT/OT  Morbid obesity Type 2 diabetes Uncontrolled, hyperglycemia -Continue glargine - Continue sliding scale corrections - Hold metformin   Hypomagnesemia Hypokalemia Supplemented and resolved  History of cervical and rectal cancer status post colectomy with colostomy  Anemia of chronic disease Postoperative acute blood loss anemia Status post 1 unit transfusion last Friday.  Hemoglobin stable since then  Hyperlipidemia -Continue Lipitor  Hypothyroidism -Continue levothyroxine   Mood disorder -Continue sertraline        Subjective: No new complaints, no new concerns.  She has a lot of pain in her left BKA stump.     Physical Exam: BP (!) 132/41   Pulse (!) 56   Temp 98.1 F (36.7 C) (Oral)   Resp 16   Ht 5' (1.524 m)   Wt 66.1 kg   SpO2 97%   BMI 28.46 kg/m   Obese adult female, lying in bed, interactive and appropriate RRR, no murmurs, no peripheral edema Respiratory normal, lungs clear without rales or wheezes Abdomen soft, no tenderness palpation or guarding, no ascites or distention   Data Reviewed: Basic metabolic panel shows mild hypomagnesemia      Family Communication:     Disposition: Status is:  Inpatient         Author: Lonni SHAUNNA Dalton, MD 07/20/2024 3:58 PM  For on call review [email or web address].

## 2024-07-20 NOTE — Progress Notes (Signed)
 Physical Therapy Treatment Patient Details Name: [name] MRN: [id number] DOB: [birth year] Today's Date: 07/20/2024   History of Present Illness [name] is a 78 y.o. female admitted 07/12/24 for planned L BKA d/t necrotic non-healing left heel ulcer. PMHx: T2DM, HLD, HTN, cervical and rectal CA s/p colostomy, PAD s/p  left common iliac artery stenting (05/30/24), neuropathy, diabetic foot ulcer, hypothyroidism, arthritis, and anemia.    PT Comments  Pt greeted supine in bed, pleasant and agreeable to PT session. She continues to require moderate cues for functional mobility sequencing d/t decreased motor planning. Pt sat EOB with minA d/t posterior lean. She continues to demonstrated impaired balance depending on external support and reports a fear of falling which is slowing progression. Pt engaged in L BKA exercises. Her left knee flexion is limited and pt refuses PROM and AAROM attempts d/t pain. She completed 5 reps of sit<>stand using RW with maxA x2 and an anterior knee block on RLE. Patient will benefit from continued inpatient follow up therapy, <3 hours/day.    If plan is discharge home, recommend the following: Two people to help with walking and/or transfers;Two people to help with bathing/dressing/bathroom;Assistance with cooking/housework;Assist for transportation;Help with stairs or ramp for entrance   Can travel by private vehicle     No  Equipment Recommendations  Rolling walker (2 wheels)    Recommendations for Other Services       Precautions / Restrictions Precautions Precautions: Fall Recall of Precautions/Restrictions: Impaired Precaution/Restrictions Comments: ostomy Required Braces or Orthoses: Other Brace Other Brace: Limb Guard LLE Restrictions Weight Bearing Restrictions Per Provider Order: Yes LLE Weight Bearing Per Provider Order: Non weight bearing     Mobility  Bed Mobility Overal bed mobility: Needs Assistance Bed Mobility: Supine to  Sit     Supine to sit: Mod assist, +2 for physical assistance     General bed mobility comments: Pt sat up on R side of bed with increased time. She was able to slowly bring BLE towards EOB. Assist to bring LEs off EOB. She pulled trunk to side using bed rail. Cues for sequencing. Assist to elevate trunk and scoot fwd with use of bed pad.    Transfers Overall transfer level: Needs assistance Equipment used: Rolling walker (2 wheels) Transfers: Bed to chair/wheelchair/BSC, Sit to/from Stand Sit to Stand: Max assist, +2 physical assistance Stand pivot transfers: Max assist, +2 physical assistance         General transfer comment: Pt transferred to recliner chair on right positioned touching bed with arm rest dropped. Anterior block provided on RLE. Cues for sequencing. Powered up with maxA x2 and use of bed pad. Facilitated pivot turn at hips. Eased pt down onto recliner chair. Introduced RW and educated pt on proper sequencing and technique. Emphasis on pt tucking R foot underneath her, increased fwd lean, and utilization of momenum. Pt stood from recliner chair 5 times. Used bed pad to facilitate hip ext otherwise pt with trunk flex and hip flex. She quickly fatigued. Scooted pt back in chair using bed pad.    Ambulation/Gait               General Gait Details: Unable   Stairs             Wheelchair Mobility     Tilt Bed    Modified Rankin (Stroke Patients Only)       Balance Overall balance assessment: Needs assistance Sitting-balance support: Feet supported, Bilateral upper extremity supported Sitting balance-Leahy  Scale: Poor Sitting balance - Comments: Pt dependent on BUE support holding onto bed, rail, or therapist. Requires minA for support d/t lean. Postural control: Posterior lean Standing balance support: Bilateral upper extremity supported, During functional activity, Reliant on assistive device for balance Standing balance-Leahy Scale:  Poor Standing balance comment: Pt dependent on RW and maxA x2. She engaged in 5 stands, maintaining upright posture for a maximum of ~20 seconds. Use of bed pad to facilitate full hip ext and limited posterior lean.                            Communication Communication Communication: No apparent difficulties  Cognition Arousal: Alert Behavior During Therapy: WFL for tasks assessed/performed   PT - Cognitive impairments: Attention, Initiation, Sequencing, Problem solving, Safety/Judgement, Awareness                       PT - Cognition Comments: Pt requires moderate cues for improved technique and safety awareness. Following commands: Impaired Following commands impaired: Only follows one step commands consistently    Cueing Cueing Techniques: Verbal cues, Gestural cues, Visual cues  Exercises Amputee Exercises Quad Sets: Supine, Both, AROM, 10 reps Hip ABduction/ADduction: Supine, Left, AROM, 10 reps Knee Flexion: Supine, Left, AROM, 10 reps Knee Extension: Seated, Left, AROM, 10 reps    General Comments General comments (skin integrity, edema, etc.): VSS on RA      Pertinent Vitals/Pain Pain Assessment Pain Assessment: Faces Faces Pain Scale: Hurts little more Pain Location: LLE Pain Descriptors / Indicators: Operative site guarding, Grimacing, Discomfort Pain Intervention(s): Monitored during session, Limited activity within patient's tolerance, Repositioned    Home Living                          Prior Function            PT Goals (current goals can now be found in the care plan section) Acute Rehab PT Goals Patient Stated Goal: Have less pain. Get better Progress towards PT goals: Progressing toward goals    Frequency    Min 2X/week      PT Plan      Co-evaluation              AM-PAC PT 6 Clicks Mobility   Outcome Measure  Help needed turning from your back to your side while in a flat bed without using  bedrails?: A Lot Help needed moving from lying on your back to sitting on the side of a flat bed without using bedrails?: Total Help needed moving to and from a bed to a chair (including a wheelchair)?: Total Help needed standing up from a chair using your arms (e.g., wheelchair or bedside chair)?: Total Help needed to walk in hospital room?: Total Help needed climbing 3-5 steps with a railing? : Total 6 Click Score: 7    End of Session Equipment Utilized During Treatment: Gait belt Activity Tolerance: Patient tolerated treatment well;Patient limited by fatigue Patient left: in chair;with call bell/phone within reach;with chair alarm set Nurse Communication: Mobility status;Other (comment) (recommend pt is laterally scooted back to bed with +2 assist using bed pad.) PT Visit Diagnosis: Pain;Difficulty in walking, not elsewhere classified (R26.2);Unsteadiness on feet (R26.81);Other abnormalities of gait and mobility (R26.89) Pain - Right/Left: Left Pain - part of body: Knee;Leg     Time: 8863-8799 PT Time Calculation (min) (ACUTE ONLY): 24 min  Charges:    $  Therapeutic Exercise: 8-22 mins $Therapeutic Activity: 8-22 mins PT General Charges $$ ACUTE PT VISIT: 1 Visit                     Randall SAUNDERS, PT, DPT Acute Rehabilitation Services Office: [phone] Secure Chat Preferred  [name] 07/20/2024, 12:52 PM

## 2024-07-20 NOTE — TOC Progression Note (Addendum)
 Transition of Care [facility]) - Progression Note    Patient Details  Name: [name] MRN: [id number] Date of Birth: 05-Jul-1946  [facility]) CM/SW Contact  Bridget Cordella Simmonds, LCSW Phone Number: 07/20/2024, 10:33 AM  Clinical Narrative:   Pt oriented x3, CSW spoke with pt, grandson Engineer, manufacturing systems and then with daughter [name] by phone, discussed SNF denial, they do want to appeal.  [name] will call appeal number to initiate appeal.   1130: TC Kim.  She was directed to [phone] and was told the auth request needs to be resubmitted.  She was not able to start an appeal.   1300: CSW spoke with [employer]/[phone], was given the following update: SNF shara has been denied but MD can still complete P2P and the auth could be approved.  P2P call [phone], ref# [id number].  MD informed.   1600: New SNF auth request submitted by CMA Janell.    Expected Discharge Plan: Skilled Nursing Facility Barriers to Discharge: Continued Medical Work up               Expected Discharge Plan and Services In-house Referral: Clinical Social Work   Post Acute Care Choice: Skilled Nursing Facility Living arrangements for the past 2 months: Single Family Home Expected Discharge Date: 07/17/24                                     Social Drivers of Health (SDOH) Interventions SDOH Screenings   Food Insecurity: No Food Insecurity (07/12/2024)  Housing: Low Risk  (07/12/2024)  Transportation Needs: No Transportation Needs (07/12/2024)  Utilities: Not At Risk (07/12/2024)  Depression (PHQ2-9): Low Risk  (06/23/2024)  Social Connections: Unknown (07/12/2024)  Tobacco Use: Medium Risk (07/12/2024)    Readmission Risk Interventions    10/15/2023    9:49 AM 09/11/2023   11:36 AM 12/02/2021    1:55 PM  Readmission Risk Prevention Plan  Transportation Screening Complete Complete Complete  PCP or Specialist Appt within 3-5 Days  Complete   HRI or Home Care Consult  Complete   Social Work  Consult for Recovery Care Planning/Counseling  Complete   Palliative Care Screening  Not Applicable   Medication Review Oceanographer) Referral to Pharmacy Complete Complete  PCP or Specialist appointment within 3-5 days of discharge Complete  Complete  HRI or Home Care Consult Complete  Complete  SW Recovery Care/Counseling Consult Complete  Complete  Palliative Care Screening Not Applicable  Not Applicable  Skilled Nursing Facility Not Applicable  Patient Refused

## 2024-07-20 NOTE — Plan of Care (Signed)

## 2024-07-20 NOTE — Progress Notes (Signed)
  Progress Note    07/20/2024 7:41 AM 8 Days Post-Op  Subjective:  somewhat confused  Vitals:   07/19/24 2007 07/20/24 0440  BP: (!) 138/44 (!) 134/48  Pulse: 68 (!) 55  Resp: 15 16  Temp: 98.7 F (37.1 C) 97.8 F (36.6 C)  SpO2: 97% 98%    Physical Exam: Confused LLE bka site healing well  CBC    Component Value Date/Time   WBC 4.8 07/16/2024 0301   RBC 2.98 (L) 07/16/2024 0301   HGB 7.7 (L) 07/16/2024 0301   HGB 9.3 (L) 06/23/2024 1105   HCT 25.4 (L) 07/16/2024 0301   PLT 162 07/16/2024 0301   PLT 212 06/23/2024 1105   MCV 85.2 07/16/2024 0301   MCH 25.8 (L) 07/16/2024 0301   MCHC 30.3 07/16/2024 0301   RDW 17.4 (H) 07/16/2024 0301   LYMPHSABS 0.9 06/23/2024 1105   MONOABS 0.7 06/23/2024 1105   EOSABS 0.0 06/23/2024 1105   BASOSABS 0.0 06/23/2024 1105    BMET    Component Value Date/Time   NA 143 07/20/2024 0222   NA 144 08/21/2017 1553   K 4.0 07/20/2024 0222   CL 106 07/20/2024 0222   CO2 25 07/20/2024 0222   GLUCOSE 92 07/20/2024 0222   BUN 14 07/20/2024 0222   BUN 21 08/21/2017 1553   CREATININE 0.79 07/20/2024 0222   CREATININE 0.74 06/23/2024 1105   CALCIUM  8.2 (L) 07/20/2024 0222   CALCIUM  9.6 09/19/2010 2220   GFRNONAA >60 07/20/2024 0222   GFRNONAA >60 06/23/2024 1105   GFRAA >60 03/29/2020 0314    INR    Component Value Date/Time   INR 1.2 07/12/2024 0919     Intake/Output Summary (Last 24 hours) at 07/20/2024 0741 Last data filed at 07/20/2024 0643 Gross per 24 hour  Intake 480 ml  Output 1500 ml  Net -1020 ml     Assessment:  78 y.o. female is s/p L BKA on 8/26  Plan: Ok for dc from vascular standpoint Needs continued sock compression of residual limb and limb protector with out of bed   Kambra Beachem C. Sheree, MD [facility] Office: [phone] Pager: [phone]  07/20/2024 7:41 AM

## 2024-07-20 NOTE — Plan of Care (Signed)
   Problem: Education: Goal: Knowledge of General Education information will improve Description Including pain rating scale, medication(s)/side effects and non-pharmacologic comfort measures Outcome: Progressing

## 2024-07-21 DIAGNOSIS — L97429 Non-pressure chronic ulcer of left heel and midfoot with unspecified severity: Secondary | ICD-10-CM | POA: Diagnosis not present

## 2024-07-21 LAB — BASIC METABOLIC PANEL WITH GFR
Anion gap: 12 (ref 5–15)
BUN: 12 mg/dL (ref 8–23)
CO2: 25 mmol/L (ref 22–32)
Calcium: 8.3 mg/dL — ABNORMAL LOW (ref 8.9–10.3)
Chloride: 104 mmol/L (ref 98–111)
Creatinine, Ser: 0.81 mg/dL (ref 0.44–1.00)
GFR, Estimated: >60 mL/min (ref >60)
Glucose, Bld: 149 mg/dL — ABNORMAL HIGH (ref 70–99)
Potassium: 4.1 mmol/L (ref 3.5–5.1)
Sodium: 141 mmol/L (ref 135–145)

## 2024-07-21 LAB — CBC
HCT: 27.7 % — ABNORMAL LOW (ref 36.0–46.0)
Hemoglobin: 8 g/dL — ABNORMAL LOW (ref 12.0–15.0)
MCH: 25.3 pg — ABNORMAL LOW (ref 26.0–34.0)
MCHC: 28.9 g/dL — ABNORMAL LOW (ref 30.0–36.0)
MCV: 87.7 fL (ref 80.0–100.0)
Platelets: 201 K/uL (ref 150–400)
RBC: 3.16 MIL/uL — ABNORMAL LOW (ref 3.87–5.11)
RDW: 17.8 % — ABNORMAL HIGH (ref 11.5–15.5)
WBC: 5.9 K/uL (ref 4.0–10.5)
nRBC: 0 % (ref 0.0–0.2)

## 2024-07-21 LAB — GLUCOSE, CAPILLARY
Glucose-Capillary: 131 mg/dL — ABNORMAL HIGH (ref 70–99)
Glucose-Capillary: 188 mg/dL — ABNORMAL HIGH (ref 70–99)
Glucose-Capillary: 103 mg/dL — ABNORMAL HIGH (ref 70–99)
Glucose-Capillary: 106 mg/dL — ABNORMAL HIGH (ref 70–99)
Glucose-Capillary: 82 mg/dL (ref 70–99)

## 2024-07-21 LAB — MAGNESIUM: Magnesium: 1.7 mg/dL (ref 1.7–2.4)

## 2024-07-21 MED ORDER — OXYCODONE HCL 5 MG PO TABS
5.0000 mg | ORAL_TABLET | ORAL | Status: DC | PRN
  Administered 2024-07-21 – 2024-07-27 (×6): 5 mg via ORAL
  Filled 2024-07-21 (×6): qty 1

## 2024-07-21 NOTE — TOC Progression Note (Addendum)
 Transition of Care [facility]) - Progression Note    Patient Details  Name: [name] MRN: [id number] Date of Birth: 07/25/1946  Transition of Care [facility]) CM/SW Contact  Bridget Cordella Simmonds, LCSW Phone Number: 07/21/2024, 3:30 PM  Clinical Narrative:   CMA Jon and Janell in contact with [employer].  We are being told to file expedited appeal: [phone] . Mzq#[id number]   CSW spoke with pt daughter [name] and she will call again and attempt to start appeal.   1600: TC Kim.  She was able to appeal, was given case #[id number].  Expected Discharge Plan: Skilled Nursing Facility Barriers to Discharge: Continued Medical Work up               Expected Discharge Plan and Services In-house Referral: Clinical Social Work   Post Acute Care Choice: Skilled Nursing Facility Living arrangements for the past 2 months: Single Family Home Expected Discharge Date: 07/17/24                                     Social Drivers of Health (SDOH) Interventions SDOH Screenings   Food Insecurity: No Food Insecurity (07/12/2024)  Housing: Low Risk  (07/12/2024)  Transportation Needs: No Transportation Needs (07/12/2024)  Utilities: Not At Risk (07/12/2024)  Depression (PHQ2-9): Low Risk  (06/23/2024)  Social Connections: Unknown (07/12/2024)  Tobacco Use: Medium Risk (07/12/2024)    Readmission Risk Interventions    10/15/2023    9:49 AM 09/11/2023   11:36 AM 12/02/2021    1:55 PM  Readmission Risk Prevention Plan  Transportation Screening Complete Complete Complete  PCP or Specialist Appt within 3-5 Days  Complete   HRI or Home Care Consult  Complete   Social Work Consult for Recovery Care Planning/Counseling  Complete   Palliative Care Screening  Not Applicable   Medication Review Oceanographer) Referral to Pharmacy Complete Complete  PCP or Specialist appointment within 3-5 days of discharge Complete  Complete  HRI or Home Care Consult Complete  Complete  SW Recovery Care/Counseling  Consult Complete  Complete  Palliative Care Screening Not Applicable  Not Applicable  Skilled Nursing Facility Not Applicable  Patient Refused

## 2024-07-21 NOTE — Progress Notes (Signed)
  Progress Note   Patient: [name] FMW:[id number] DOB: November 09, 1946 DOA: 07/12/2024     9 DOS: the patient was seen and examined on 07/21/2024        Brief hospital course: 78 y.o. F with DM, HTN, MO, cervical and rectal cancer s/p colostomy, diabetic foot ulcer, neuropathy, and hypothyroidism who presented for critical limb ischemia with left heel ulceration and nonhealing heel wound.    Admitted and underwent BKA.  Required 1 unit PRBC transfusion on 8/28 for post-operative blood loss anemia.        Assessment and Plan: Gangrene, left heel status post BKA - Continue cephalexin  - Continue Lipitor, Plavix  - Continue analgesics - PT/OT  Recent femur fracture on the left - PT/OT  Type 2 diabetes Uncontrolled with hyperglycemia - Continue glargine - Continue sliding scale corrections - Hold metformin   Morbid obesity  Anemia of chronic kidney disease Postoperative acute blood loss anemia Underwent 1 transfusion postoperatively, hemoglobin stable since then, no bleeding  Hyperlipidemia - Continue Lipitor  Hypothyroidism - Continue levothyroxine   Mood disorder - Continue Zoloft   History of cervical and rectal cancer status post colectomy with colostomy  Hypokalemia and hypomagnesemia - Supplement magnesium          Subjective: No new concerns, no nursing complaints.  No fever.     Physical Exam: BP (!) 123/38 (BP Location: Right Arm)   Pulse 63   Temp 98.2 F (36.8 C) (Oral)   Resp 15   Ht 5' (1.524 m)   Wt 66.1 kg   SpO2 93%   BMI 28.46 kg/m   Adult female, sitting up in recliner, interactive and appropriate RRR, no murmurs, no peripheral edema Respiratory rate normal, lungs clear without wheezes or rales Abdomen soft, no tenderness palpation or guarding, no ascites or distention She has mild psychomotor slowing, face is symmetric, upper extremity strength appears generally weak, she has a new left BKA    Data Reviewed: Metabolic  panel shows normal electrolytes and renal function CBC shows stable anemia, slightly better than before    Family Communication: Son at the bedside    Disposition: Status is: Inpatient The patient was admitted for gangrene of the left heel.  She has undergone BKA and she is debilitated from her baseline.  She lives independently with her husband, and will require daily rehabilitation for several weeks to return to her previous level of function.  All hospital level cares are completed and she is medically stable, unsafe to discharge to home given her upper extremity weakness, new amputation, inability to safely transfer and is awaiting SNF authorization.        Author: Lonni SHAUNNA Dalton, MD 07/21/2024 4:29 PM  For on call review [email or web address].

## 2024-07-21 NOTE — Progress Notes (Signed)
 Occupational Therapy Treatment Patient Details Name: [name] MRN: [id number] DOB: [birth year] Today's Date: 07/21/2024   History of present illness [name] is a 78 y.o. female admitted 07/12/24 for planned L BKA d/t necrotic non-healing left heel ulcer. PMHx: T2DM, HLD, HTN, cervical and rectal CA s/p colostomy, PAD s/p  left common iliac artery stenting (05/30/24), neuropathy, diabetic foot ulcer, hypothyroidism, arthritis, and anemia.   OT comments  Pt making slower progress towards OT goals, limited primarily by anxiety and fear of falling with transfer attempts today resulting in significant posterior bias with sliding board transfers. With significant safety and sequencing cues, pt able to complete sliding board transfer to drop arm recliner with Max A x 2. NT present, as well - discussed maximove transfer vs lateral scoot w/ blanket back to bed based on today's presentation. Continue to recommend postacute rehab stay as family unable to provide the current physical assistance that pt requires for ADLs/transfers.      If plan is discharge home, recommend the following:  Two people to help with walking and/or transfers;Two people to help with bathing/dressing/bathroom;Assistance with cooking/housework;Direct supervision/assist for medications management;Direct supervision/assist for financial management;Assist for transportation;Help with stairs or ramp for entrance;Supervision due to cognitive status   Equipment Recommendations  Wheelchair (measurements OT);Wheelchair cushion (measurements OT);Other (comment) (TBD)    Recommendations for Other Services      Precautions / Restrictions Precautions Precautions: Fall Recall of Precautions/Restrictions: Impaired Precaution/Restrictions Comments: ostomy Required Braces or Orthoses: Other Brace Other Brace: Limb Guard LLE Restrictions Weight Bearing Restrictions Per Provider Order: Yes LLE Weight Bearing Per Provider Order:  Non weight bearing       Mobility Bed Mobility Overal bed mobility: Needs Assistance Bed Mobility: Supine to Sit     Supine to sit: Max assist, HOB elevated, Used rails     General bed mobility comments: cues for sequencing, hand placement and avoiding resistance with trunk lifting    Transfers Overall transfer level: Needs assistance Equipment used: Sliding board Transfers: Bed to chair/wheelchair/BSC            Lateral/Scoot Transfers: Max assist, +2 safety/equipment, With slide board General transfer comment: going slightly downhill to drop arm recliner with sliding board. However, pt with significant push back and anxiety despite max cues to lean forward, hold to therapists and/or push from bed. Max A x 2 needed to safely get to chair     Balance Overall balance assessment: Needs assistance Sitting-balance support: Feet supported, Bilateral upper extremity supported Sitting balance-Leahy Scale: Poor   Postural control: Posterior lean                                 ADL either performed or assessed with clinical judgement   ADL Overall ADL's : Needs assistance/impaired     Grooming: Set up;Wash/dry face;Bed level               Lower Body Dressing: Total assistance;Bed level Lower Body Dressing Details (indicate cue type and reason): sock mgmt                    Extremity/Trunk Assessment Upper Extremity Assessment Upper Extremity Assessment: Generalized weakness;Right hand dominant   Lower Extremity Assessment Lower Extremity Assessment: Defer to PT evaluation        Vision   Vision Assessment?: No apparent visual deficits   Perception     Praxis     Communication  Communication Communication: No apparent difficulties   Cognition Arousal: Alert Behavior During Therapy: WFL for tasks assessed/performed Cognition: Cognition impaired     Awareness: Intellectual awareness intact, Online awareness impaired Memory  impairment (select all impairments): Short-term memory, Working [employer] fast streamer, Conservation officer, historic buildings Attention impairment (select first level of impairment): Sustained attention, Selective attention Executive functioning impairment (select all impairments): Organization, Reasoning, Problem solving OT - Cognition Comments: minor confusion ongoing but improved from prior OT session. Pt noted with anxiety with transfers today requiring max cues for safety/attention to directions                 Following commands: Impaired Following commands impaired: Follows one step commands inconsistently, Follows one step commands with increased time      Cueing   Cueing Techniques: Verbal cues, Gestural cues, Visual cues  Exercises      Shoulder Instructions       General Comments      Pertinent Vitals/ Pain       Pain Assessment Pain Assessment: Faces Faces Pain Scale: Hurts a little bit Pain Location: LLE Pain Descriptors / Indicators: Operative site guarding, Grimacing Pain Intervention(s): Monitored during session  Home Living                                          Prior Functioning/Environment              Frequency  Min 2X/week        Progress Toward Goals  OT Goals(current goals can now be found in the care plan section)  Progress towards OT goals: OT to reassess next treatment  Acute Rehab OT Goals Patient Stated Goal: less pain, not have a fall OT Goal Formulation: With patient/family Time For Goal Achievement: 07/27/24 Potential to Achieve Goals: Fair ADL Goals Pt Will Perform Upper Body Bathing: with supervision;sitting Pt Will Perform Lower Body Bathing: with mod assist;with adaptive equipment;sitting/lateral leans;sit to/from stand Pt Will Perform Lower Body Dressing: with mod assist;with adaptive equipment;sitting/lateral leans;sit to/from stand Pt Will Transfer to Toilet: with mod assist;ambulating;bedside commode Pt Will Perform  Toileting - Clothing Manipulation and hygiene: with mod assist;sitting/lateral leans Pt/caregiver will Perform Home Exercise Program: Increased strength;Both right and left upper extremity;With theraband;With written HEP provided;With Supervision  Plan      Co-evaluation                 AM-PAC OT 6 Clicks Daily Activity     Outcome Measure   Help from another person eating meals?: A Little Help from another person taking care of personal grooming?: A Little Help from another person toileting, which includes using toliet, bedpan, or urinal?: Total Help from another person bathing (including washing, rinsing, drying)?: A Lot Help from another person to put on and taking off regular upper body clothing?: A Little Help from another person to put on and taking off regular lower body clothing?: Total 6 Click Score: 13    End of Session Equipment Utilized During Treatment: Gait belt;Other (comment) (sliding board)  OT Visit Diagnosis: Other abnormalities of gait and mobility (R26.89);Unsteadiness on feet (R26.81);History of falling (Z91.81);Muscle weakness (generalized) (M62.81);Other symptoms and signs involving cognitive function;Pain Pain - Right/Left: Left Pain - part of body: Leg   Activity Tolerance Patient tolerated treatment well   Patient Left in chair;with call bell/phone within reach;with chair alarm set   Nurse Communication Mobility status  Time: 8964-8940 OT Time Calculation (min): 24 min  Charges: OT General Charges $OT Visit: 1 Visit OT Treatments $Self Care/Home Management : 8-22 mins $Therapeutic Activity: 8-22 mins  Mliss NOVAK, OTR/L Acute Rehab Services Office: [phone]   Mliss Fish 07/21/2024, 12:52 PM

## 2024-07-22 ENCOUNTER — Inpatient Hospital Stay: Admitting: Oncology

## 2024-07-22 ENCOUNTER — Inpatient Hospital Stay

## 2024-07-22 DIAGNOSIS — L97429 Non-pressure chronic ulcer of left heel and midfoot with unspecified severity: Secondary | ICD-10-CM | POA: Diagnosis not present

## 2024-07-22 LAB — GLUCOSE, CAPILLARY
Glucose-Capillary: 50 mg/dL — ABNORMAL LOW (ref 70–99)
Glucose-Capillary: 273 mg/dL — ABNORMAL HIGH (ref 70–99)
Glucose-Capillary: 53 mg/dL — ABNORMAL LOW (ref 70–99)
Glucose-Capillary: 128 mg/dL — ABNORMAL HIGH (ref 70–99)
Glucose-Capillary: 155 mg/dL — ABNORMAL HIGH (ref 70–99)
Glucose-Capillary: 76 mg/dL (ref 70–99)

## 2024-07-22 MED ORDER — DEXTROSE 50 % IV SOLN
1.0000 | Freq: Once | INTRAVENOUS | Status: AC
  Administered 2024-07-22: 50 mL via INTRAVENOUS

## 2024-07-22 NOTE — Progress Notes (Signed)
 Physical Therapy Treatment Patient Details Name: [name] MRN: [id number] DOB: 1945-12-26 Today's Date: 07/22/2024   History of Present Illness [name] is a 78 y.o. female admitted 07/12/24 for planned L BKA d/t necrotic non-healing left heel ulcer. PMHx: T2DM, HLD, HTN, cervical and rectal CA s/p colostomy, PAD s/p  left common iliac artery stenting (05/30/24), neuropathy, diabetic foot ulcer, hypothyroidism, arthritis, and anemia.    PT Comments  Pt engaged in transfer training. She continues to require maxA x2. Pt performed sit<>stand 5 times using RW. As she fatigued, pt's right knee flexion significantly increased and standing tolerance significantly decreased. Pt maintained static stance with right anterior knee block for a maximum of ~20 seconds. Educated pt on head-hips relationship prior to bed>chair transfer. Utilized moderate multi-modal cues for proper sequencing. Pt scooted with assistance of bed pad and was unable to engage with RLE d/t lack of floor contact. Will continue to follow acutely and advance appropriately.     If plan is discharge home, recommend the following: Two people to help with walking and/or transfers;Two people to help with bathing/dressing/bathroom;Assistance with cooking/housework;Assist for transportation;Help with stairs or ramp for entrance   Can travel by private vehicle     No  Equipment Recommendations  Rolling walker (2 wheels)    Recommendations for Other Services       Precautions / Restrictions Precautions Precautions: Fall Recall of Precautions/Restrictions: Impaired Precaution/Restrictions Comments: ostomy Required Braces or Orthoses: Other Brace Other Brace: Limb Guard LLE Restrictions Weight Bearing Restrictions Per Provider Order: Yes LLE Weight Bearing Per Provider Order: Non weight bearing     Mobility  Bed Mobility Overal bed mobility: Needs Assistance Bed Mobility: Supine to Sit, Rolling, Sit to Supine Rolling:  Contact guard assist, Used rails   Supine to sit: Min assist, HOB elevated, Used rails, +2 for physical assistance Sit to supine: Mod assist, +2 for physical assistance, +2 for safety/equipment   General bed mobility comments: Pt performed supine>sit on either side of the bed. She brought BLE off EOB. Assist to elevate trunk and scoot fwd until L foot flat with use of bed pad. Returned pt back to bed as she was on the edge and unable to scoot back without bed pad underneath her, modA x2 to safely return to be fully supported.    Transfers Overall transfer level: Needs assistance Equipment used: Rolling walker (2 wheels), None Transfers: Sit to/from Stand, Bed to chair/wheelchair/BSC Sit to Stand: Mod assist, Max assist, +2 physical assistance          Lateral/Scoot Transfers: Max assist, +2 physical assistance General transfer comment: Pt stood from lowest bed height. Cued proper hand placement using RW. Powered up with mod-maxA x2. Foot blocked anterior. Upon rising, pt with L knee flex. Cued increased quad activiation. Pt quickly fatiguing in standing. Provided anterior knee block with pillow between PT and pt for comfort. Pt stood 4 more times. As she fatigued, R knee flex became more significant and pt had to return to sitting sooner. Transferred to recliner chair on right. PT/Therapy Tech utilized bed pad to pivot pt to chair. Educated pt on head-hips relationship. Cued pt on which way to lean and hand  positioning. Minimal assist noted d/t pt's fear of falling.    Ambulation/Gait               General Gait Details: Counselling psychologist  Tilt Bed    Modified Rankin (Stroke Patients Only)       Balance Overall balance assessment: Needs assistance Sitting-balance support: Feet supported, Bilateral upper extremity supported Sitting balance-Leahy Scale: Fair Sitting balance - Comments: Pt sat EOB with close supervision-CGA  statically and min-maxA dynamically. Pt with posterior bias d/t fear of falling. Postural control: Posterior lean Standing balance support: Bilateral upper extremity supported, During functional activity, Reliant on assistive device for balance Standing balance-Leahy Scale: Poor Standing balance comment: Pt dependent on RW and maxA x2.                            Communication Communication Communication: No apparent difficulties  Cognition Arousal: Alert Behavior During Therapy: WFL for tasks assessed/performed, Anxious   PT - Cognitive impairments: Sequencing, Problem solving, Safety/Judgement                       PT - Cognition Comments: Pt nervous to mobilize d/t fear of falling. She was attentive during session, but required mod-max cues and frequent re-education on instructions. Following commands: Impaired Following commands impaired: Follows one step commands inconsistently, Follows one step commands with increased time    Cueing Cueing Techniques: Verbal cues, Gestural cues, Visual cues  Exercises Amputee Exercises Quad Sets: Supine, Both, AROM, 10 reps Hip ABduction/ADduction: Supine, Left, AROM, 10 reps Straight Leg Raises: Supine, Left, AROM, 10 reps    General Comments General comments (skin integrity, edema, etc.): VSS on RA      Pertinent Vitals/Pain Pain Assessment Pain Assessment: Faces Faces Pain Scale: Hurts a little bit Pain Location: LLE Pain Descriptors / Indicators: Discomfort, Aching, Operative site guarding Pain Intervention(s): Premedicated before session, Monitored during session, Limited activity within patient's tolerance, Repositioned    Home Living                          Prior Function            PT Goals (current goals can now be found in the care plan section) Acute Rehab PT Goals Patient Stated Goal: Continue to heal Progress towards PT goals: Progressing toward goals    Frequency    Min  2X/week      PT Plan      Co-evaluation              AM-PAC PT 6 Clicks Mobility   Outcome Measure  Help needed turning from your back to your side while in a flat bed without using bedrails?: A Lot Help needed moving from lying on your back to sitting on the side of a flat bed without using bedrails?: Total Help needed moving to and from a bed to a chair (including a wheelchair)?: Total Help needed standing up from a chair using your arms (e.g., wheelchair or bedside chair)?: Total Help needed to walk in hospital room?: Total Help needed climbing 3-5 steps with a railing? : Total 6 Click Score: 7    End of Session Equipment Utilized During Treatment: Gait belt Activity Tolerance: Patient tolerated treatment well Patient left: in chair;with call bell/phone within reach;with chair alarm set Nurse Communication: Mobility status;Need for lift equipment (maximove to transfer back to chair if pt too fatigued to engaged in lateral scoot) PT Visit Diagnosis: Pain;Difficulty in walking, not elsewhere classified (R26.2);Unsteadiness on feet (R26.81);Other abnormalities of gait and mobility (R26.89) Pain - Right/Left: Left Pain - part of body:  Knee;Leg     Time: 8895-8867 PT Time Calculation (min) (ACUTE ONLY): 28 min  Charges:    $Therapeutic Activity: 23-37 mins PT General Charges $$ ACUTE PT VISIT: 1 Visit                     Randall SAUNDERS, PT, DPT Acute Rehabilitation Services Office: [phone] Secure Chat Preferred  [name] 07/22/2024, 1:00 PM

## 2024-07-22 NOTE — Progress Notes (Signed)
  Progress Note   Patient: [name] FMW:[id number] DOB: September 04, 1946 DOA: 07/12/2024     10 DOS: the patient was seen and examined on 07/22/2024 at 12:20PM      Brief hospital course: 78 y.o. F with DM, HTN, MO, cervical and rectal cancer s/p colostomy, diabetic foot ulcer, neuropathy, and hypothyroidism who presented for critical limb ischemia with left heel ulceration and nonhealing heel wound.    Admitted and underwent BKA.  Required 1 unit PRBC transfusion on 8/28 for post-operative blood loss anemia.        Assessment and Plan: Gangrene of the left heel BKA this hospitalization - Continue Keflex , Lipitor, Plavix  - PT OT  Recent femur fracture left - Continue PT/OT  Type 2 diabetes with hyperglycemia Glucose labile but mostly improved - Continue glargine and sliding scale corrections - Continue gabapentin  - Hold metformin   Anemia of chronic kidney disease Postoperative acute blood loss anemia Underwent transfusion postoperatively, but hemoglobin has been stable since then  Hyperlipidemia - Continue Lipitor  Hypothyroidism - Continue levothyroxine   Morbid obesity  Mood disorder - Continue Zoloft , PRN Xanax   History of cervical and rectal cancer s/p colectomy with colostomy  Hypokalemia and hypomagnesemia Supplemented and resolved         Subjective: Patient is doing okay, she has no new concerns, no nursing complaints.  No fever, no respiratory symptoms. Appetite good.     Physical Exam: BP 101/78 (BP Location: Left Arm)   Pulse 82   Temp 98.1 F (36.7 C) (Oral)   Resp 18   Ht 5' (1.524 m)   Wt 66.1 kg   SpO2 97%   BMI 28.46 kg/m   Elderly adult female, lying in bed, no acute distress RRR, no murmurs, no peripheral edema Respiratory rate normal, lungs clear without rales or wheezes Abdomen soft no tenderness palpation or guarding, no ascites or distention She is oriented to self, place, she seems to have some mild cognitive  impairment, but I believe this is her baseline, she has generalized weakness in the upper extremities    Data Reviewed: No new labs     Family Communication:     Disposition: Status is: Inpatient         Author: Lonni SHAUNNA Dalton, MD 07/22/2024 5:46 PM  For on call review [email or web address].

## 2024-07-22 NOTE — Progress Notes (Signed)
    Patient continues to await SNF authorization.  BKA appears to be healing well.  Shrinker sock and limb protector should be worn when she is mobile.     Shana Younge C. Sheree, MD [facility] Office: [phone] Pager: [phone]

## 2024-07-22 NOTE — Progress Notes (Signed)
 9380 - Blood glucose 50 mg/dL. Patient sitting up in bed, alert and oriented - 236 ml orange juice given PO. 0644 - Blood Glu re-checked - 53 mg/dL. NP on duty informed. D50 protocol initiated per standing order. 50 ml D50 IV given. 9283 Blood sugar re-checked after 15 mins = 273 mg/dL. Patient alert and tolerated small portion from breakfast tray. Report given to oncoming RN.

## 2024-07-22 NOTE — Consult Note (Signed)
 WOC Nurse Consult Note: Reason for Consult: sacral wound Wound type: unstageable pressure injury to sacrum Pressure Injury POA: No Measurement: 4 cm x 2 cm  Wound bed: 50% dark purple maroon, 40% black eschar, 10 % yellow slough Drainage (amount, consistency, odor) serosanguinous  Periwound: red, non blanchable discoloration  Dressing procedure/placement/frequency:  Cleanse with NS, pat dry.  Apply xeroform over wound bed, cover with silicone foam dressing and change daily.    WOC Nurse team will follow with you and see patient within 10 days for wound assessments.  Please notify WOC nurses of any acute changes in the wounds or any new areas of concern   Doyal Polite, RN, MSN, [facility] WOC Team [phone] (Available Mon-Fri [phone])

## 2024-07-22 NOTE — Inpatient Diabetes Management (Signed)
 Inpatient Diabetes Program Recommendations  AACE/ADA: New Consensus Statement on Inpatient Glycemic Control (2015)  Target Ranges:  Prepandial:   less than 140 mg/dL      Peak postprandial:   less than 180 mg/dL (1-2 hours)      Critically ill patients:  140 - 180 mg/dL   Lab Results  Component Value Date   GLUCAP 128 (H) 07/22/2024   HGBA1C 6.0 (H) 07/12/2024    Review of Glycemic Control  Latest Reference Range & Units 07/21/24 16:26 07/21/24 23:12 07/22/24 06:19 07/22/24 06:44 07/22/24 07:16 07/22/24 11:37  Glucose-Capillary 70 - 99 mg/dL 893 (H) 82 50 (L) 53 (L) 273 (H) 128 (H)  (H): Data is abnormally high (L): Data is abnormally low Diabetes history: Type 2 DM Outpatient Diabetes medications: Metformin  750 mg BID Current orders for Inpatient glycemic control: Novolog  0-15 units TID & HS, Lantus  10 units QD  Inpatient Diabetes Program Recommendations:    Noted hypoglycemia of 50 mg/dL this AM. Consider discontinuing Lantus .   Thanks, Tinnie Minus, MSN, RNC-OB Diabetes Coordinator [phone] (8a-5p)

## 2024-07-23 DIAGNOSIS — L97429 Non-pressure chronic ulcer of left heel and midfoot with unspecified severity: Secondary | ICD-10-CM | POA: Diagnosis not present

## 2024-07-23 LAB — GLUCOSE, CAPILLARY
Glucose-Capillary: 92 mg/dL (ref 70–99)
Glucose-Capillary: 135 mg/dL — ABNORMAL HIGH (ref 70–99)
Glucose-Capillary: 129 mg/dL — ABNORMAL HIGH (ref 70–99)
Glucose-Capillary: 100 mg/dL — ABNORMAL HIGH (ref 70–99)

## 2024-07-23 NOTE — Progress Notes (Signed)
  Progress Note   Patient: [name] FMW:[id number] DOB: [birth year] DOA: 07/12/2024     11 DOS: the patient was seen and examined on 07/23/2024        Brief hospital course: 78 y.o. F with DM, HTN, MO, cervical and rectal cancer s/p colostomy, diabetic foot ulcer, neuropathy, and hypothyroidism who presented for critical limb ischemia with left heel ulceration and nonhealing heel wound.    Admitted and underwent BKA.  Required 1 unit PRBC transfusion on 8/28 for post-operative blood loss anemia.        Assessment and Plan: Gangrene, left heel status post BKA - Continue cephalexin  - Continue Lipitor, Plavix  - Continue analgesics - PT/OT  Recent femur fracture on the left - PT/OT  Type 2 diabetes Uncontrolled with hyperglycemia earlier in hospital stay Glucose controlled today - Continue sliding scale corrections - Hold metformin   Morbid obesity  Anemia of chronic kidney disease Postoperative acute blood loss anemia Underwent 1 transfusion postoperatively, hemoglobin stable since then, no bleeding  Hyperlipidemia - Continue Lipitor  Hypothyroidism - Continue levothyroxine   Mood disorder - Continue Zoloft   History of cervical and rectal cancer status post colectomy with colostomy  Hypokalemia and hypomagnesemia Resolved         Subjective: No new concerns, no nursing complaints.  No fever.     Physical Exam: BP 113/75 (BP Location: Right Arm)   Pulse 68   Temp 97.8 F (36.6 C) (Oral)   Resp 16   Ht 5' (1.524 m)   Wt 66.1 kg   SpO2 99%   BMI 28.46 kg/m   Adult female, sitting up in recliner, interactive and appropriate RRR, no murmurs, no peripheral edema Respiratory rate normal, lungs clear without wheezes or rales Abdomen soft, no tenderness palpation or guarding, no ascites or distention She has mild psychomotor slowing, face is symmetric, upper extremity strength appears generally weak, she has a new left BKA    Data  Reviewed: Glucose normal   Family Communication:      Disposition: Status is: Inpatient The patient was admitted for gangrene of the left heel.  She has undergone BKA and she is debilitated from her baseline.  She lives independently with her husband, and will require daily rehabilitation for several weeks to return to her previous level of function.  All hospital level cares are completed and she is medically stable, unsafe to discharge to home given her upper extremity weakness, new amputation, inability to safely transfer and is awaiting SNF authorization.        Author: Lonni SHAUNNA Dalton, MD 07/23/2024 10:59 AM  For on call review [email or web address].

## 2024-07-23 NOTE — TOC Progression Note (Signed)
 Transition of Care [facility]) - Progression Note    Patient Details  Name: [name] MRN: [id number] Date of Birth: August 22, 1946  Transition of Care [facility]) CM/SW Contact  Isaiah Public, LCSWA Phone Number: 07/23/2024, 4:21 PM  Clinical Narrative:     CSW requested for Jon CMA to check on appeal status. Jon informed CSW appeals Dept. business hours are between M-F. CSW will follow up on status on Monday.  Expected Discharge Plan: Skilled Nursing Facility Barriers to Discharge: Continued Medical Work up               Expected Discharge Plan and Services In-house Referral: Clinical Social Work   Post Acute Care Choice: Skilled Nursing Facility Living arrangements for the past 2 months: Single Family Home Expected Discharge Date: 07/17/24                                     Social Drivers of Health (SDOH) Interventions SDOH Screenings   Food Insecurity: No Food Insecurity (07/12/2024)  Housing: Low Risk  (07/12/2024)  Transportation Needs: No Transportation Needs (07/12/2024)  Utilities: Not At Risk (07/12/2024)  Depression (PHQ2-9): Low Risk  (06/23/2024)  Social Connections: Unknown (07/12/2024)  Tobacco Use: Medium Risk (07/12/2024)    Readmission Risk Interventions    10/15/2023    9:49 AM 09/11/2023   11:36 AM 12/02/2021    1:55 PM  Readmission Risk Prevention Plan  Transportation Screening Complete Complete Complete  PCP or Specialist Appt within 3-5 Days  Complete   HRI or Home Care Consult  Complete   Social Work Consult for Recovery Care Planning/Counseling  Complete   Palliative Care Screening  Not Applicable   Medication Review Oceanographer) Referral to Pharmacy Complete Complete  PCP or Specialist appointment within 3-5 days of discharge Complete  Complete  HRI or Home Care Consult Complete  Complete  SW Recovery Care/Counseling Consult Complete  Complete  Palliative Care Screening Not Applicable  Not Applicable  Skilled Nursing Facility  Not Applicable  Patient Refused

## 2024-07-23 NOTE — Plan of Care (Signed)

## 2024-07-24 DIAGNOSIS — L97429 Non-pressure chronic ulcer of left heel and midfoot with unspecified severity: Secondary | ICD-10-CM | POA: Diagnosis not present

## 2024-07-24 LAB — GLUCOSE, CAPILLARY
Glucose-Capillary: 116 mg/dL — ABNORMAL HIGH (ref 70–99)
Glucose-Capillary: 161 mg/dL — ABNORMAL HIGH (ref 70–99)
Glucose-Capillary: 132 mg/dL — ABNORMAL HIGH (ref 70–99)
Glucose-Capillary: 172 mg/dL — ABNORMAL HIGH (ref 70–99)
Glucose-Capillary: 123 mg/dL — ABNORMAL HIGH (ref 70–99)

## 2024-07-24 NOTE — Plan of Care (Signed)
  Problem: Education: Goal: Knowledge of General Education information will improve Description: Including pain rating scale, medication(s)/side effects and non-pharmacologic comfort measures Outcome: Progressing   Problem: Health Behavior/Discharge Planning: Goal: Ability to manage health-related needs will improve Outcome: Progressing   Problem: Clinical Measurements: Goal: Ability to maintain clinical measurements within normal limits will improve Outcome: Progressing Goal: Will remain free from infection Outcome: Progressing Goal: Diagnostic test results will improve Outcome: Progressing Goal: Respiratory complications will improve Outcome: Progressing Goal: Cardiovascular complication will be avoided Outcome: Progressing   Problem: Activity: Goal: Risk for activity intolerance will decrease Outcome: Progressing   Problem: Nutrition: Goal: Adequate nutrition will be maintained Outcome: Progressing   Problem: Coping: Goal: Level of anxiety will decrease Outcome: Progressing   Problem: Elimination: Goal: Will not experience complications related to bowel motility Outcome: Progressing Goal: Will not experience complications related to urinary retention Outcome: Progressing   Problem: Pain Managment: Goal: General experience of comfort will improve and/or be controlled Outcome: Progressing   Problem: Safety: Goal: Ability to remain free from injury will improve Outcome: Progressing   Problem: Skin Integrity: Goal: Risk for impaired skin integrity will decrease Outcome: Progressing   Problem: Education: Goal: Ability to describe self-care measures that may prevent or decrease complications (Diabetes Survival Skills Education) will improve Outcome: Progressing Goal: Individualized Educational Video(s) Outcome: Progressing   Problem: Coping: Goal: Ability to adjust to condition or change in health will improve Outcome: Progressing   Problem: Fluid  Volume: Goal: Ability to maintain a balanced intake and output will improve Outcome: Progressing   Problem: Health Behavior/Discharge Planning: Goal: Ability to identify and utilize available resources and services will improve Outcome: Progressing Goal: Ability to manage health-related needs will improve Outcome: Progressing   Problem: Metabolic: Goal: Ability to maintain appropriate glucose levels will improve Outcome: Progressing   Problem: Nutritional: Goal: Maintenance of adequate nutrition will improve Outcome: Progressing Goal: Progress toward achieving an optimal weight will improve Outcome: Progressing   Problem: Skin Integrity: Goal: Risk for impaired skin integrity will decrease Outcome: Progressing   Problem: Tissue Perfusion: Goal: Adequacy of tissue perfusion will improve Outcome: Progressing   Problem: Clinical Measurements: Goal: Ability to avoid or minimize complications of infection will improve Outcome: Progressing   Problem: Skin Integrity: Goal: Skin integrity will improve Outcome: Progressing   Problem: Education: Goal: Knowledge of the prescribed therapeutic regimen will improve Outcome: Progressing Goal: Ability to verbalize activity precautions or restrictions will improve Outcome: Progressing Goal: Understanding of discharge needs will improve Outcome: Progressing   Problem: Activity: Goal: Ability to perform//tolerate increased activity and mobilize with assistive devices will improve Outcome: Progressing   Problem: Clinical Measurements: Goal: Postoperative complications will be avoided or minimized Outcome: Progressing   Problem: Self-Care: Goal: Ability to meet self-care needs will improve Outcome: Progressing   Problem: Self-Concept: Goal: Ability to maintain and perform role responsibilities to the fullest extent possible will improve Outcome: Progressing   Problem: Pain Management: Goal: Pain level will decrease with  appropriate interventions Outcome: Progressing   Problem: Skin Integrity: Goal: Demonstration of wound healing without infection will improve Outcome: Progressing

## 2024-07-24 NOTE — Progress Notes (Signed)
  Progress Note   Patient: [name] FMW:[id number] DOB: 1946-07-04 DOA: 07/12/2024     12 DOS: the patient was seen and examined on 07/24/2024        Brief hospital course: 78 y.o. F with DM, HTN, MO, cervical and rectal cancer s/p colostomy, diabetic foot ulcer, neuropathy, and hypothyroidism who presented for critical limb ischemia with left heel ulceration and nonhealing heel wound.    Admitted and underwent BKA.  Required 1 unit PRBC transfusion on 8/28 for post-operative blood loss anemia.        Assessment and Plan: Gangrene, left heel status post BKA - Continue cephalexin  - Continue Lipitor, Plavix  - Continue analgesics - PT/OT  Recent femur fracture on the left - PT/OT  Type 2 diabetes Uncontrolled with hyperglycemia earlier in hospital stay Glucose controlled today - Continue sliding scale corrections - Hold metformin   Morbid obesity  Anemia of chronic kidney disease Postoperative acute blood loss anemia Underwent 1 transfusion postoperatively, hemoglobin stable since then, no bleeding  Hyperlipidemia - Continue Lipitor  Hypothyroidism - Continue levothyroxine   Mood disorder - Continue Zoloft   History of cervical and rectal cancer status post colectomy with colostomy  Hypokalemia and hypomagnesemia Resolved         Subjective: No new fever, no nursing concerns, no new complaints.     Physical Exam: BP (!) 129/40 (BP Location: Right Arm)   Pulse (!) 52   Temp 98.6 F (37 C) (Oral)   Resp 16   Ht 5' (1.524 m)   Wt 66.1 kg   SpO2 (!) 65%   BMI 28.46 kg/m   Adult female, sitting up in bed, eating lunch RRR, no peripheral edema, no murmurs Respiratory rate unremarkable, lungs clear, no rales or wheezes Abdomen soft, no tenderness, no guarding, no ascites Mild psychomotor slowing, but short-term memory seems impaired, upper extremity strength is generally weak, left BKAv    Data Reviewed: No new   Family Communication:       Disposition: Status is: Inpatient The patient was admitted for gangrene of the left heel.  She has undergone BKA and she is debilitated from her baseline.  She lives independently with her husband, and will require daily rehabilitation for several weeks to return to her previous level of function.  All hospital level cares are completed and she is medically stable, unsafe to discharge to home given her upper extremity weakness, new amputation, inability to safely transfer and is awaiting SNF authorization.        Author: Lonni SHAUNNA Dalton, MD 07/24/2024 5:16 PM  For on call review [email or web address].

## 2024-07-25 DIAGNOSIS — L97429 Non-pressure chronic ulcer of left heel and midfoot with unspecified severity: Secondary | ICD-10-CM | POA: Diagnosis not present

## 2024-07-25 LAB — GLUCOSE, CAPILLARY
Glucose-Capillary: 151 mg/dL — ABNORMAL HIGH (ref 70–99)
Glucose-Capillary: 96 mg/dL (ref 70–99)
Glucose-Capillary: 216 mg/dL — ABNORMAL HIGH (ref 70–99)
Glucose-Capillary: 148 mg/dL — ABNORMAL HIGH (ref 70–99)

## 2024-07-25 MED ORDER — MEDIHONEY WOUND/BURN DRESSING EX PSTE
1.0000 | PASTE | Freq: Every day | CUTANEOUS | Status: DC
  Administered 2024-07-25 – 2024-07-27 (×3): 1 via TOPICAL
  Filled 2024-07-25: qty 44

## 2024-07-25 MED ORDER — GABAPENTIN 300 MG PO CAPS
300.0000 mg | ORAL_CAPSULE | Freq: Two times a day (BID) | ORAL | Status: DC
Start: 2024-07-25 — End: 2024-07-27
  Administered 2024-07-25 – 2024-07-27 (×4): 300 mg via ORAL
  Filled 2024-07-25 (×4): qty 1

## 2024-07-25 NOTE — Progress Notes (Signed)
 Mobility Specialist Progress Note:    07/25/24 1736  Mobility  Activity Pivoted/transferred to/from [facility];Pivoted/transferred from bed to chair  Level of Assistance Moderate assist, patient does 50-74% (+2)  Assistive Device Stedy  LLE Weight Bearing Per Provider Order NWB  Activity Response Tolerated well  Mobility Referral Yes  Mobility visit 1 Mobility  Mobility Specialist Start Time (ACUTE ONLY) 1420  Mobility Specialist Stop Time (ACUTE ONLY) 1438  Mobility Specialist Time Calculation (min) (ACUTE ONLY) 18 min   Pt received in chair requesting assistance to [facility] and back to bed. Pt required ModA +2 throughout session. Mod VC needed to keep pt on task. Unable to use the [facility]. Returned to bed w/o fault. Call bell and personal belongings in reach. All needs met. Bed alarm on.  Thersia Minder Mobility Specialist  Please contact vis Secure Chat or  Rehab Office [phone]

## 2024-07-25 NOTE — Progress Notes (Signed)
  Progress Note   Patient: [name] FMW:[id number] DOB: January 02, 1946 DOA: 07/12/2024     13 DOS: the patient was seen and examined on 07/25/2024        Brief hospital course: 78 y.o. F with DM, HTN, MO, cervical and rectal cancer s/p colostomy, diabetic foot ulcer, neuropathy, and hypothyroidism who presented for critical limb ischemia with left heel ulceration and nonhealing heel wound.    Admitted and underwent BKA.  Required 1 unit PRBC transfusion on 8/28 for post-operative blood loss anemia.        Assessment and Plan: Gangrene, left heel status post BKA - Continue cephalexin  - Continue Lipitor, Plavix  - Continue analgesics - PT/OT  Recent femur fracture on the left - PT/OT  Delirium This has been a low-grade persistent issue - Stop OxyContin  - Reduce gabapentin   Type 2 diabetes Uncontrolled with hyperglycemia earlier in hospital stay Glucose controlled today - Continue sliding scale corrections - Hold metformin   Morbid obesity  Anemia of chronic kidney disease Postoperative acute blood loss anemia Underwent 1 transfusion postoperatively, hemoglobin stable since then, no bleeding  Hyperlipidemia - Continue Lipitor  Hypothyroidism - Continue levothyroxine   Mood disorder - Continue Zoloft   History of cervical and rectal cancer status post colectomy with colostomy  Hypokalemia and hypomagnesemia Resolved         Subjective: No clinical change, no new nursing concerns.     Physical Exam: BP (!) 110/25 (BP Location: Left Arm)   Pulse 64   Temp 97.9 F (36.6 C)   Resp 16   Ht 5' (1.524 m)   Wt 66.1 kg   SpO2 97%   BMI 28.46 kg/m   Adult female, sitting up, interactive and appropriate RRR, no murmurs, no peripheral edema Respiratory normal, lungs clear, no rales or wheezes Abdomen soft, no tenderness palpation Her stump is in the shrinker, no evidence of swelling, no pain to palpation She has mild psychomotor slowing, mildly  impaired memory    Data Reviewed: No new   Family Communication:      Disposition: Status is: Inpatient The patient was admitted for gangrene of the left heel.  She has undergone BKA and she is debilitated from her baseline.  She lives independently with her husband, and will require daily rehabilitation for several weeks to return to her previous level of function.  All hospital level cares are completed and she is medically stable, unsafe to discharge to home given her upper extremity weakness, new amputation, inability to safely transfer and is awaiting SNF authorization.        Author: Lonni SHAUNNA Dalton, MD 07/25/2024 4:42 PM  For on call review [email or web address].

## 2024-07-25 NOTE — Progress Notes (Signed)
 Physical Therapy Treatment Patient Details Name: [name] MRN: [id number] DOB: 1946-04-24 Today's Date: 07/25/2024   History of Present Illness [name] is a 78 y.o. female admitted 07/12/24 for planned L BKA d/t necrotic non-healing left heel ulcer. PMHx: T2DM, HLD, HTN, cervical and rectal CA s/p colostomy, PAD s/p  left common iliac artery stenting (05/30/24), neuropathy, diabetic foot ulcer, hypothyroidism, arthritis, and anemia.    PT Comments  Pt received in bed, has not had her limb protector off and has difficulty achieving full knee extension. OT to don limb protector end of their session that followed PT. Pt able to initiate coming to EOB with use of rail but needs mod A to come fully to sitting and then loses balance bkwds due to fear of falling off EOB. Posterior lean is also hindering transfers. She was able to stand to RW with max A +2 and was doing well pushing with RLE. However, when working on squat pivot transfer she is so fearful that she didn't push with RLE and was a dependent transfer to chair. Recommend continues lateral scoot transfers with nursing staff. Pt completed LLE amputee exercises with assist and discussed proper positioning to promote full knee ROM. Patient will benefit from continued inpatient follow up therapy, <3 hours/day. PT will continue to follow.     If plan is discharge home, recommend the following: Two people to help with walking and/or transfers;Two people to help with bathing/dressing/bathroom;Assistance with cooking/housework;Assist for transportation;Help with stairs or ramp for entrance   Can travel by private vehicle     No  Equipment Recommendations  Rolling walker (2 wheels)    Recommendations for Other Services       Precautions / Restrictions Precautions Precautions: Fall Recall of Precautions/Restrictions: Impaired Precaution/Restrictions Comments: ostomy Required Braces or Orthoses: Other Brace Other Brace: Limb Guard  LLE Restrictions Weight Bearing Restrictions Per Provider Order: Yes LLE Weight Bearing Per Provider Order: Non weight bearing     Mobility  Bed Mobility Overal bed mobility: Needs Assistance Bed Mobility: Supine to Sit     Supine to sit: HOB elevated, Used rails, Mod assist     General bed mobility comments: cues for use of rail, pt able to come to SL and initiate sliding LE's off EOB but needed mod A to come to sitting and max A to scoot hips to EOB, in part due to pt fear. Pt had complete posterior LOB with initial sitting with max A needed to come back up to upright    Transfers Overall transfer level: Needs assistance Equipment used: Rolling walker (2 wheels), None Transfers: Sit to/from Stand, Bed to chair/wheelchair/BSC Sit to Stand: Max assist, +2 physical assistance, +2 safety/equipment     Squat pivot transfers: Total assist, +2 physical assistance     General transfer comment: pt participate with STS x3 to RW with max A +2 for power up. Maintained standing for up to 15 secs with R foot blocked. Performed squat pivot to R but with this, pt did not push with RLE and was a depenedent transfer to recliner    Ambulation/Gait               General Gait Details: Unable   Stairs             Wheelchair Mobility     Tilt Bed    Modified Rankin (Stroke Patients Only)       Balance Overall balance assessment: Needs assistance Sitting-balance support: Feet supported, Bilateral upper  extremity supported Sitting balance-Leahy Scale: Fair Sitting balance - Comments: has LOB posterior Postural control: Posterior lean Standing balance support: Bilateral upper extremity supported, During functional activity, Reliant on assistive device for balance Standing balance-Leahy Scale: Zero Standing balance comment: Pt dependent on RW and maxA x2.                            Communication Communication Communication: No apparent difficulties   Cognition Arousal: Alert Behavior During Therapy: WFL for tasks assessed/performed, Anxious   PT - Cognitive impairments: Sequencing, Problem solving, Safety/Judgement, Memory   Orientation impairments: Time                   PT - Cognition Comments: Pt nervous to mobilize d/t fear of falling. She was attentive during session, but required mod-max cues and frequent re-education on instructions. Following commands: Impaired Following commands impaired: Follows one step commands inconsistently, Follows one step commands with increased time    Cueing Cueing Techniques: Verbal cues, Gestural cues, Visual cues  Exercises Amputee Exercises Quad Sets: Supine, Both, AROM, 10 reps Gluteal Sets: AROM, Both, 10 reps, Supine Hip ABduction/ADduction: Supine, Left, AROM, 10 reps Hip Flexion/Marching: AROM, Left, 10 reps, Supine Knee Flexion: Left, AROM, 10 reps, Seated Knee Extension: Seated, Left, AROM, 10 reps Straight Leg Raises: Supine, Left, AROM, 10 reps Other Exercises Other Exercises: OT to don limb protector at end of their session    General Comments General comments (skin integrity, edema, etc.): VSS      Pertinent Vitals/Pain Pain Assessment Pain Assessment: Faces Faces Pain Scale: No hurt    Home Living                          Prior Function            PT Goals (current goals can now be found in the care plan section) Acute Rehab PT Goals Patient Stated Goal: Continue to heal PT Goal Formulation: With patient/family Time For Goal Achievement: 07/27/24 Potential to Achieve Goals: Fair Progress towards PT goals: Progressing toward goals    Frequency    Min 2X/week      PT Plan      Co-evaluation              AM-PAC PT 6 Clicks Mobility   Outcome Measure  Help needed turning from your back to your side while in a flat bed without using bedrails?: A Lot Help needed moving from lying on your back to sitting on the side of a flat  bed without using bedrails?: Total Help needed moving to and from a bed to a chair (including a wheelchair)?: Total Help needed standing up from a chair using your arms (e.g., wheelchair or bedside chair)?: Total Help needed to walk in hospital room?: Total Help needed climbing 3-5 steps with a railing? : Total 6 Click Score: 7    End of Session Equipment Utilized During Treatment: Gait belt Activity Tolerance: Patient tolerated treatment well Patient left: in chair;with call bell/phone within reach;with chair alarm set Nurse Communication: Mobility status;Need for lift equipment (maximove to transfer back to chair if pt too fatigued to engaged in lateral scoot) PT Visit Diagnosis: Pain;Difficulty in walking, not elsewhere classified (R26.2);Unsteadiness on feet (R26.81);Other abnormalities of gait and mobility (R26.89) Pain - Right/Left: Left Pain - part of body: Knee;Leg     Time: 8876-8865 PT Time Calculation (min) (ACUTE ONLY): 11  min  Charges:    $Therapeutic Exercise: 8-22 mins PT General Charges $$ ACUTE PT VISIT: 1 Visit                     Richerd Lipoma, PT  Acute Rehab Services Secure chat preferred Office [phone]    Richerd CROME Milan Perkins 07/25/2024, 12:08 PM

## 2024-07-25 NOTE — Progress Notes (Signed)
 Previous port dressing was peeling off.  Cleaned and placed tegaderm for support until IV team change port dressing. Order was placed for IV team consult to change dressing.  Will continue to monitor.

## 2024-07-25 NOTE — Progress Notes (Signed)
 Occupational Therapy Treatment Patient Details Name: [name] MRN: [id number] DOB: [birth year] Today's Date: 07/25/2024   History of present illness [name] is a 78 y.o. female admitted 07/12/24 for planned L BKA d/t necrotic non-healing left heel ulcer. PMHx: T2DM, HLD, HTN, cervical and rectal CA s/p colostomy, PAD s/p  left common iliac artery stenting (05/30/24), neuropathy, diabetic foot ulcer, hypothyroidism, arthritis, and anemia.   OT comments  Pt progressing toward established OT goals. Challenging return to BADL and transfers this session. Pt needing up to max A for STS and total A for squat pivot transfer needing cues for problem solving and sequencing throughout. Pt engaged in LB ADL needing mod cues /education for compensatory strategies. Reviewed strategies for desensitization and pt verbalized understanding, but poor carryover.       If plan is discharge home, recommend the following:  Two people to help with walking and/or transfers;Two people to help with bathing/dressing/bathroom;Assistance with cooking/housework;Direct supervision/assist for medications management;Direct supervision/assist for financial management;Assist for transportation;Help with stairs or ramp for entrance;Supervision due to cognitive status   Equipment Recommendations  Wheelchair (measurements OT);Wheelchair cushion (measurements OT);Other (comment) (TBD)    Recommendations for Other Services      Precautions / Restrictions Precautions Precautions: Fall Recall of Precautions/Restrictions: Impaired Precaution/Restrictions Comments: ostomy Required Braces or Orthoses: Other Brace Other Brace: Limb Guard LLE Restrictions Weight Bearing Restrictions Per Provider Order: Yes LLE Weight Bearing Per Provider Order: Non weight bearing       Mobility Bed Mobility Overal bed mobility: Needs Assistance Bed Mobility: Supine to Sit     Supine to sit: HOB elevated, Used rails, Mod assist      General bed mobility comments: cues for use of rail, pt able to come to SL and initiate sliding LE's off EOB but needed mod A to come to sitting and max A to scoot hips to EOB, in part due to pt fear. Pt had complete posterior LOB with initial sitting with max A needed to come back up to upright    Transfers Overall transfer level: Needs assistance Equipment used: Rolling walker (2 wheels), None Transfers: Sit to/from Stand, Bed to chair/wheelchair/BSC Sit to Stand: Max assist, +2 physical assistance, +2 safety/equipment   Squat pivot transfers: Total assist, +2 physical assistance       General transfer comment: pt participate with STS x3 to RW with max A +2 for power up. Maintained standing for up to 15 secs with R foot blocked. Performed squat pivot to R but with this, pt did not push with RLE and was a depenedent transfer to recliner     Balance Overall balance assessment: Needs assistance Sitting-balance support: Feet supported, Bilateral upper extremity supported Sitting balance-Leahy Scale: Fair Sitting balance - Comments: has LOB posterior Postural control: Posterior lean Standing balance support: Bilateral upper extremity supported, During functional activity, Reliant on assistive device for balance Standing balance-Leahy Scale: Zero Standing balance comment: Pt dependent on RW and maxA x2.                           ADL either performed or assessed with clinical judgement   ADL Overall ADL's : Needs assistance/impaired     Grooming: Set up;Sitting               Lower Body Dressing: Maximal assistance;Sitting/lateral leans Lower Body Dressing Details (indicate cue type and reason): difficulty achieving figure 4 position. needing cues for problem solving to don limb  protector Toilet Transfer: Maximal assistance;Total assistance;+2 for physical assistance;+2 for safety/equipment;Squat-pivot                  Extremity/Trunk Assessment Upper  Extremity Assessment Upper Extremity Assessment: Generalized weakness   Lower Extremity Assessment Lower Extremity Assessment: Defer to PT evaluation        Vision   Vision Assessment?: No apparent visual deficits   Perception     Praxis     Communication Communication Communication: No apparent difficulties   Cognition Arousal: Alert Behavior During Therapy: WFL for tasks assessed/performed, Anxious Cognition: Cognition impaired     Awareness: Intellectual awareness intact, Online awareness impaired Memory impairment (select all impairments): Short-term memory, Working [employer] fast streamer, Conservation officer, historic buildings Attention impairment (select first level of impairment): Sustained attention, Selective attention Executive functioning impairment (select all impairments): Organization, Reasoning, Problem solving OT - Cognition Comments: pt needing cues for safety and problem solving during all mobility.                 Following commands: Impaired Following commands impaired: Follows one step commands inconsistently, Follows one step commands with increased time      Cueing   Cueing Techniques: Verbal cues, Gestural cues, Visual cues  Exercises Exercises: Amputee Amputee Exercises Quad Sets: Supine, Both, AROM, 10 reps Gluteal Sets: AROM, Both, 10 reps, Supine Hip ABduction/ADduction: Supine, Left, AROM, 10 reps Hip Flexion/Marching: AROM, Left, 10 reps, Supine Knee Flexion: Left, AROM, 10 reps, Seated Knee Extension: Seated, Left, AROM, 10 reps Straight Leg Raises: Supine, Left, AROM, 10 reps    Shoulder Instructions       General Comments VSS    Pertinent Vitals/ Pain       Pain Assessment Pain Assessment: Faces Faces Pain Scale: No hurt  Home Living                                          Prior Functioning/Environment              Frequency  Min 2X/week        Progress Toward Goals  OT Goals(current goals can now be found in  the care plan section)  Progress towards OT goals: Progressing toward goals  Acute Rehab OT Goals OT Goal Formulation: With patient/family Time For Goal Achievement: 07/27/24 Potential to Achieve Goals: Fair ADL Goals Pt Will Perform Upper Body Bathing: with supervision;sitting Pt Will Perform Lower Body Bathing: with mod assist;with adaptive equipment;sitting/lateral leans;sit to/from stand Pt Will Perform Lower Body Dressing: with mod assist;with adaptive equipment;sitting/lateral leans;sit to/from stand Pt Will Transfer to Toilet: with mod assist;ambulating;bedside commode Pt Will Perform Toileting - Clothing Manipulation and hygiene: with mod assist;sitting/lateral leans Pt/caregiver will Perform Home Exercise Program: Increased strength;Both right and left upper extremity;With theraband;With written HEP provided;With Supervision  Plan      Co-evaluation                 AM-PAC OT 6 Clicks Daily Activity     Outcome Measure   Help from another person eating meals?: A Little Help from another person taking care of personal grooming?: A Little Help from another person toileting, which includes using toliet, bedpan, or urinal?: Total Help from another person bathing (including washing, rinsing, drying)?: A Lot Help from another person to put on and taking off regular upper body clothing?: A Little Help from another person to put on and  taking off regular lower body clothing?: Total 6 Click Score: 13    End of Session Equipment Utilized During Treatment: Gait belt;Rolling walker (2 wheels) (limb protector)  OT Visit Diagnosis: Other abnormalities of gait and mobility (R26.89);Unsteadiness on feet (R26.81);History of falling (Z91.81);Muscle weakness (generalized) (M62.81);Other symptoms and signs involving cognitive function;Pain Pain - Right/Left: Left Pain - part of body: Leg   Activity Tolerance Patient tolerated treatment well   Patient Left in chair;with call  bell/phone within reach;with chair alarm set   Nurse Communication Mobility status        Time: 8864-8842 OT Time Calculation (min): 22 min  Charges: OT General Charges $OT Visit: 1 Visit OT Treatments $Self Care/Home Management : 8-22 mins  Elma JONETTA Lebron FREDERICK, OTR/L [facility] Acute Rehabilitation Office: [phone]   Elma JONETTA Lebron 07/25/2024, 3:16 PM

## 2024-07-25 NOTE — Plan of Care (Signed)
  Problem: Education: Goal: Knowledge of General Education information will improve Description: Including pain rating scale, medication(s)/side effects and non-pharmacologic comfort measures Outcome: Progressing   Problem: Health Behavior/Discharge Planning: Goal: Ability to manage health-related needs will improve Outcome: Progressing   Problem: Clinical Measurements: Goal: Ability to maintain clinical measurements within normal limits will improve Outcome: Progressing Goal: Will remain free from infection Outcome: Progressing Goal: Diagnostic test results will improve Outcome: Progressing Goal: Respiratory complications will improve Outcome: Progressing Goal: Cardiovascular complication will be avoided Outcome: Progressing   Problem: Activity: Goal: Risk for activity intolerance will decrease Outcome: Progressing   Problem: Nutrition: Goal: Adequate nutrition will be maintained Outcome: Progressing   Problem: Coping: Goal: Level of anxiety will decrease Outcome: Progressing   Problem: Elimination: Goal: Will not experience complications related to bowel motility Outcome: Progressing Goal: Will not experience complications related to urinary retention Outcome: Progressing   Problem: Pain Managment: Goal: General experience of comfort will improve and/or be controlled Outcome: Progressing   Problem: Safety: Goal: Ability to remain free from injury will improve Outcome: Progressing   Problem: Skin Integrity: Goal: Risk for impaired skin integrity will decrease Outcome: Progressing   Problem: Education: Goal: Ability to describe self-care measures that may prevent or decrease complications (Diabetes Survival Skills Education) will improve Outcome: Progressing Goal: Individualized Educational Video(s) Outcome: Progressing   Problem: Coping: Goal: Ability to adjust to condition or change in health will improve Outcome: Progressing   Problem: Fluid  Volume: Goal: Ability to maintain a balanced intake and output will improve Outcome: Progressing   Problem: Health Behavior/Discharge Planning: Goal: Ability to identify and utilize available resources and services will improve Outcome: Progressing Goal: Ability to manage health-related needs will improve Outcome: Progressing   Problem: Metabolic: Goal: Ability to maintain appropriate glucose levels will improve Outcome: Progressing   Problem: Nutritional: Goal: Maintenance of adequate nutrition will improve Outcome: Progressing Goal: Progress toward achieving an optimal weight will improve Outcome: Progressing   Problem: Skin Integrity: Goal: Risk for impaired skin integrity will decrease Outcome: Progressing   Problem: Tissue Perfusion: Goal: Adequacy of tissue perfusion will improve Outcome: Progressing   Problem: Clinical Measurements: Goal: Ability to avoid or minimize complications of infection will improve Outcome: Progressing   Problem: Skin Integrity: Goal: Skin integrity will improve Outcome: Progressing   Problem: Education: Goal: Knowledge of the prescribed therapeutic regimen will improve Outcome: Progressing Goal: Ability to verbalize activity precautions or restrictions will improve Outcome: Progressing Goal: Understanding of discharge needs will improve Outcome: Progressing   Problem: Activity: Goal: Ability to perform//tolerate increased activity and mobilize with assistive devices will improve Outcome: Progressing   Problem: Clinical Measurements: Goal: Postoperative complications will be avoided or minimized Outcome: Progressing   Problem: Self-Care: Goal: Ability to meet self-care needs will improve Outcome: Progressing   Problem: Self-Concept: Goal: Ability to maintain and perform role responsibilities to the fullest extent possible will improve Outcome: Progressing   Problem: Pain Management: Goal: Pain level will decrease with  appropriate interventions Outcome: Progressing   Problem: Skin Integrity: Goal: Demonstration of wound healing without infection will improve Outcome: Progressing

## 2024-07-25 NOTE — Plan of Care (Signed)
?  Problem: Education: ?Goal: Knowledge of General Education information will improve ?Description: Including pain rating scale, medication(s)/side effects and non-pharmacologic comfort measures ?Outcome: Progressing ?  ?Problem: Health Behavior/Discharge Planning: ?Goal: Ability to manage health-related needs will improve ?Outcome: Progressing ?  ?Problem: Clinical Measurements: ?Goal: Will remain free from infection ?Outcome: Progressing ?Goal: Cardiovascular complication will be avoided ?Outcome: Progressing ?  ?Problem: Activity: ?Goal: Risk for activity intolerance will decrease ?Outcome: Progressing ?  ?

## 2024-07-25 NOTE — TOC Progression Note (Signed)
 Transition of Care [facility]) - Progression Note    Patient Details  Name: CHASELYNN KEPPLE MRN: [id number] Date of Birth: April 23, 1946  Transition of Care [facility]) CM/SW Contact  Bridget Cordella Simmonds, LCSW Phone Number: 07/25/2024, 1:43 PM  Clinical Narrative: [employer] appeals requiring appointment of representative form.  This was signed by pt and daughter and faxed to [place] at [phone].  Per [employer] rep, if the form is received today, determination will be made by Wed, 9/10.       Expected Discharge Plan: Skilled Nursing Facility Barriers to Discharge: Continued Medical Work up               Expected Discharge Plan and Services In-house Referral: Clinical Social Work   Post Acute Care Choice: Skilled Nursing Facility Living arrangements for the past 2 months: Single Family Home Expected Discharge Date: 07/17/24                                     Social Drivers of Health (SDOH) Interventions SDOH Screenings   Food Insecurity: No Food Insecurity (07/12/2024)  Housing: Low Risk  (07/12/2024)  Transportation Needs: No Transportation Needs (07/12/2024)  Utilities: Not At Risk (07/12/2024)  Depression (PHQ2-9): Low Risk  (06/23/2024)  Social Connections: Unknown (07/12/2024)  Tobacco Use: Medium Risk (07/12/2024)    Readmission Risk Interventions    10/15/2023    9:49 AM 09/11/2023   11:36 AM 12/02/2021    1:55 PM  Readmission Risk Prevention Plan  Transportation Screening Complete Complete Complete  PCP or Specialist Appt within 3-5 Days  Complete   HRI or Home Care Consult  Complete   Social Work Consult for Recovery Care Planning/Counseling  Complete   Palliative Care Screening  Not Applicable   Medication Review Oceanographer) Referral to Pharmacy Complete Complete  PCP or Specialist appointment within 3-5 days of discharge Complete  Complete  HRI or Home Care Consult Complete  Complete  SW Recovery Care/Counseling Consult Complete  Complete  Palliative Care  Screening Not Applicable  Not Applicable  Skilled Nursing Facility Not Applicable  Patient Refused

## 2024-07-25 NOTE — Consult Note (Signed)
 WOC Nurse wound follow up Refer to previous consult note on 9/5.  Pt has an Unstageable pressure injury to sacrum/right buttock which has progressed to 100% slough/eschar.  5X5cm, moist with mod amt tan drainage.  Pt is frequently incontinent of urine and it is difficult to keep the wound from becoming soiled.   Topical treatment orders provided for bedside nurses to perform as follows: Apply Medihoney to sacru/right buttock wound Q day, then cover with foam dressing.  Change foam dressing Q 3 days or PRN soiling Dressing procedure/placement/frequency: WOC team will reassess the location Q 7-10 days to determine if a change in the plan of care is indicated at that time.   Please re-consult if further assistance is needed.  Thank-you,  Stephane Fought MSN, RN, CWOCN, CWCN-AP, CNS Contact Mon-Fri 0700-1500: [phone]

## 2024-07-26 DIAGNOSIS — L97429 Non-pressure chronic ulcer of left heel and midfoot with unspecified severity: Secondary | ICD-10-CM | POA: Diagnosis not present

## 2024-07-26 LAB — CBC
HCT: 30.7 % — ABNORMAL LOW (ref 36.0–46.0)
Hemoglobin: 8.8 g/dL — ABNORMAL LOW (ref 12.0–15.0)
MCH: 26 pg (ref 26.0–34.0)
MCHC: 28.7 g/dL — ABNORMAL LOW (ref 30.0–36.0)
MCV: 90.6 fL (ref 80.0–100.0)
Platelets: 206 K/uL (ref 150–400)
RBC: 3.39 MIL/uL — ABNORMAL LOW (ref 3.87–5.11)
RDW: 19.4 % — ABNORMAL HIGH (ref 11.5–15.5)
WBC: 5.3 K/uL (ref 4.0–10.5)
nRBC: 0 % (ref 0.0–0.2)

## 2024-07-26 LAB — COMPREHENSIVE METABOLIC PANEL WITH GFR
ALT: 21 U/L (ref 0–44)
AST: 22 U/L (ref 15–41)
Albumin: 2.1 g/dL — ABNORMAL LOW (ref 3.5–5.0)
Alkaline Phosphatase: 115 U/L (ref 38–126)
Anion gap: 8 (ref 5–15)
BUN: 12 mg/dL (ref 8–23)
CO2: 24 mmol/L (ref 22–32)
Calcium: 7.7 mg/dL — ABNORMAL LOW (ref 8.9–10.3)
Chloride: 111 mmol/L (ref 98–111)
Creatinine, Ser: 0.75 mg/dL (ref 0.44–1.00)
GFR, Estimated: >60 mL/min (ref >60)
Glucose, Bld: 137 mg/dL — ABNORMAL HIGH (ref 70–99)
Potassium: 3.4 mmol/L — ABNORMAL LOW (ref 3.5–5.1)
Sodium: 143 mmol/L (ref 135–145)
Total Bilirubin: 0.6 mg/dL (ref 0.0–1.2)
Total Protein: 5.3 g/dL — ABNORMAL LOW (ref 6.5–8.1)

## 2024-07-26 LAB — GLUCOSE, CAPILLARY
Glucose-Capillary: 143 mg/dL — ABNORMAL HIGH (ref 70–99)
Glucose-Capillary: 166 mg/dL — ABNORMAL HIGH (ref 70–99)
Glucose-Capillary: 165 mg/dL — ABNORMAL HIGH (ref 70–99)
Glucose-Capillary: 271 mg/dL — ABNORMAL HIGH (ref 70–99)
Glucose-Capillary: 59 mg/dL — ABNORMAL LOW (ref 70–99)
Glucose-Capillary: 95 mg/dL (ref 70–99)

## 2024-07-26 MED ORDER — POTASSIUM CHLORIDE 20 MEQ PO PACK
40.0000 meq | PACK | Freq: Once | ORAL | Status: AC
  Administered 2024-07-26: 40 meq via ORAL
  Filled 2024-07-26: qty 2

## 2024-07-26 NOTE — TOC Progression Note (Signed)
 Transition of Care [facility]) - Progression Note    Patient Details  Name: [name] MRN: [id number] Date of Birth: 1946-05-09  Transition of [facility]) CM/SW Contact  Bridget Cordella Simmonds, LCSW Phone Number: 07/26/2024, 10:06 AM  Clinical Narrative:   TC Sherri/[employer] medicare RN for the appeal, [phone].  She needs updated notes: PT/OT/MD to be faxed to [phone]. Should have decision soon.   Fax sent.   1430: at MD request, today's MD progress note also faxed to appeals dept.  Expected Discharge Plan: Skilled Nursing Facility Barriers to Discharge: Continued Medical Work up               Expected Discharge Plan and Services In-house Referral: Clinical Social Work   Post Acute Care Choice: Skilled Nursing Facility Living arrangements for the past 2 months: Single Family Home Expected Discharge Date: 07/17/24                                     Social Drivers of Health (SDOH) Interventions SDOH Screenings   Food Insecurity: No Food Insecurity (07/12/2024)  Housing: Low Risk  (07/12/2024)  Transportation Needs: No Transportation Needs (07/12/2024)  Utilities: Not At Risk (07/12/2024)  Depression (PHQ2-9): Low Risk  (06/23/2024)  Social Connections: Unknown (07/12/2024)  Tobacco Use: Medium Risk (07/12/2024)    Readmission Risk Interventions    10/15/2023    9:49 AM 09/11/2023   11:36 AM 12/02/2021    1:55 PM  Readmission Risk Prevention Plan  Transportation Screening Complete Complete Complete  PCP or Specialist Appt within 3-5 Days  Complete   HRI or Home Care Consult  Complete   Social Work Consult for Recovery Care Planning/Counseling  Complete   Palliative Care Screening  Not Applicable   Medication Review Oceanographer) Referral to Pharmacy Complete Complete  PCP or Specialist appointment within 3-5 days of discharge Complete  Complete  HRI or Home Care Consult Complete  Complete  SW Recovery Care/Counseling Consult Complete  Complete   Palliative Care Screening Not Applicable  Not Applicable  Skilled Nursing Facility Not Applicable  Patient Refused

## 2024-07-26 NOTE — Plan of Care (Signed)
   Problem: Clinical Measurements: Goal: Will remain free from infection Outcome: Progressing   Problem: Pain Managment: Goal: General experience of comfort will improve and/or be controlled Outcome: Progressing   Problem: Safety: Goal: Ability to remain free from injury will improve Outcome: Progressing

## 2024-07-26 NOTE — Progress Notes (Signed)
  Progress Note   Patient: [name] FMW:[id number] DOB: 06-22-1946 DOA: 07/12/2024     14 DOS: the patient was seen and examined on 07/26/2024 at 1155      Brief hospital course: 78 y.o. F with DM, HTN, MO, cervical and rectal cancer s/p colostomy, diabetic foot ulcer, neuropathy, and hypothyroidism who presented for critical limb ischemia with left heel ulceration and nonhealing heel wound.    Admitted and underwent BKA.  Required 1 unit PRBC transfusion on 8/28 for post-operative blood loss anemia.        Assessment and Plan: Gangrene of the left heel Status post left BKA Admitted and vascular surgery consulted.  Underwent BKA on 8/26.  Post-op course with some anemia, some mild delirium, seems to be gradually improving. - Stop antibiotics - Continue Lipitor and Plavix  - PT/OT - Follow up with Vascular surgery after discharge    Recent femur fracture on the left - PT/OT   Delirium I suspect the patient has some incipient dementia prior to admission, and here she has had low-grade delirium.  OxyContin  stopped and gabapentin  reduced 9/8 and her pain seems well-controlled. -Delirium precautions    Uncontrolled type 2 diabetes Hyperglycemia earlier in hospital stay, now resolved - Continue sliding scale corrections - Hold metformin   Anemia of chronic kidney disease Postoperative acute blood loss anemia Underwent transfusion 1 unit PRBCs earlier in hospital stay, no bleeding since then, hemoglobin stable  Hypothyroidism - Continue levothyroxine   Mood disorder - Continue sertraline   History of cervical and rectal cancer status post colectomy with colostomy  Hypokalemia  Hypomagnesemia Resolved  Hyperlipidemia - Continue atorvastatin   Morbid obesity      Subjective: The patient is sitting up, eating lunch.  She has soreness in her left amputation site, but no change from earlier this week, and this is overall mild, she thinks she is getting  stronger.     Physical Exam: BP (!) 143/40 (BP Location: Left Arm)   Pulse 64   Temp 98.4 F (36.9 C)   Resp 16   Ht 5' (1.524 m)   Wt 66.1 kg   SpO2 97%   BMI 28.46 kg/m   Elderly adult female, sitting up in bed, eating lunch, interactive and appropriate, makes eye contact and responds appropriately to questions RRR, no peripheral edema, no murmurs Respiratory rate normal, lungs clear without rales or wheezes Abdomen soft, no tenderness to palpation No tenderness with palpation of the left AKA stump, no evidence of swelling Mild psychomotor slowing, oriented to hospital, but does seem to have some short-term memory impairment, she has generalized weakness but symmetric strength of the upper extremities    Data Reviewed: Basic metabolic panel shows mild hypokalemia, supplemented CBC shows improving anemia     Family Communication:     Disposition: Status is: Inpatient The patient was admitted with gangrene and has undergone a BKA  At baseline she lives with her husband and is independent for most self-cares, but at present is unsafe to discharge home given upper extremity weakness, recent left leg amputation, recent right femur fracture prior to that, inability to safety transfer without two-person assist, abnormal require daily rehabilitation for several weeks before she is able to return to her previous independent level of function.  All hospital cares are complete, she is medically stable, and requires above rehabilitation        Author: Lonni SHAUNNA Dalton, MD 07/26/2024 2:24 PM  For on call review [email or web address].

## 2024-07-26 NOTE — Progress Notes (Signed)
 Hypoglycemic Event  CBG: 59 mg/dL  Treatment: 8 oz juice/soda  Symptoms: None  Follow-up CBG: Time:2153 CBG Result: 95 mg/dL  Possible Reasons for Event: Inadequate meal intake  Comments/MD notified: Will continue to monitor blood glucose and adhere to hypoglycemic protocol. NP on duty to be notified.     Mesha Schamberger N Burlene Montecalvo

## 2024-07-27 DIAGNOSIS — L97429 Non-pressure chronic ulcer of left heel and midfoot with unspecified severity: Secondary | ICD-10-CM | POA: Diagnosis not present

## 2024-07-27 LAB — GLUCOSE, CAPILLARY
Glucose-Capillary: 116 mg/dL — ABNORMAL HIGH (ref 70–99)
Glucose-Capillary: 143 mg/dL — ABNORMAL HIGH (ref 70–99)
Glucose-Capillary: 176 mg/dL — ABNORMAL HIGH (ref 70–99)
Glucose-Capillary: 186 mg/dL — ABNORMAL HIGH (ref 70–99)

## 2024-07-27 MED ORDER — POTASSIUM CHLORIDE 20 MEQ PO PACK
40.0000 meq | PACK | Freq: Once | ORAL | Status: AC
  Administered 2024-07-27: 40 meq via ORAL
  Filled 2024-07-27: qty 2

## 2024-07-27 NOTE — Progress Notes (Signed)
 AVS placed in discharge packet.  Patient excited for DC.  Family in room.  All questions answered

## 2024-07-27 NOTE — Discharge Summary (Signed)
 Physician Discharge Summary  TINIA ORAVEC FMW:[id number] DOB: 1946/04/16 DOA: 07/12/2024  PCP: Waylan Almarie SAUNDERS, MD  Admit date: 07/12/2024  Discharge date: 07/27/2024  Admitted From: Home  Disposition:  SNF  Recommendations for Outpatient Follow-up:  Follow up with PCP in 1-2 weeks. Please obtain BMP/CBC in one week. Patient is much alert and oriented now , Patient is being discharged to skilled nursing facility for rehab. Advised to follow-up with podiatry as scheduled.  Home Health: None Equipment/Devices:None  Discharge Condition: Stable CODE STATUS:Full code Diet recommendation: Carb Modified   Brief Summary/ Hospital Course: This 78 y.o. Female with DM, HTN, MO, cervical and rectal cancer s/p colostomy, diabetic foot ulcer, neuropathy, and hypothyroidism who presented for critical limb ischemia with left heel ulceration and nonhealing heel wound.  Admitted and underwent BKA.  Required 1 unit PRBC transfusion on 8/28 for post-operative blood loss anemia.  Patient is now doing much better.  Patient continued on Lipitor and Plavix .  PT and OT recommended skilled nursing facility.  Patient feels better and wants to be discharged.  Discharge Diagnoses:  Principal Problem:   Heel ulcer, left, with unspecified severity (HCC)  Gangrene of the left heel: Status post left BKA: Admitted and vascular surgery consulted.  Underwent BKA on 8/26.   Post-op course with some anemia, some mild delirium, seems to be gradually improving. - Stop antibiotics - Continue Lipitor and Plavix  - PT/OT - Follow up with Vascular surgery after discharge.    Recent femur fracture on the left: - PT/OT > SNF     Delirium: > Resolved. I suspect the patient has some incipient dementia prior to admission, and here she has had low-grade delirium.   OxyContin  stopped and gabapentin  reduced 9/8 and her pain seems well-controlled. -Delirium precautions    Uncontrolled type 2 diabetes Hyperglycemia  earlier in hospital stay, now resolved - Continue sliding scale corrections - Hold metformin  and resume at SNF   Anemia of chronic kidney disease Postoperative acute blood loss anemia Underwent transfusion 1 unit PRBCs earlier in hospital stay, no bleeding since then, hemoglobin stable.   Hypothyroidism - Continue levothyroxine .   Mood disorder - Continue sertraline .   History of cervical and rectal cancer status post colectomy with colostomy   Hypokalemia  Hypomagnesemia Resolved.   Hyperlipidemia - Continue atorvastatin .   Morbid obesity      Discharge Instructions  Discharge Instructions     Call MD for:  difficulty breathing, headache or visual disturbances   Complete by: As directed    Call MD for:  persistant dizziness or light-headedness   Complete by: As directed    Call MD for:  persistant nausea and vomiting   Complete by: As directed    Diet - low sodium heart healthy   Complete by: As directed    Diet Carb Modified   Complete by: As directed    Discharge instructions   Complete by: As directed    Advised to follow-up with primary care physician in 1 week. Patient is much alert and oriented now patient is being discharged to skilled nursing facility for rehab. Advised to follow-up with podiatry as scheduled.   Discharge wound care:   Complete by: As directed    Podiatry as scheduled.   Increase activity slowly   Complete by: As directed       Allergies as of 07/27/2024       Reactions   Codeine Hives, Nausea Only        Medication List  STOP taking these medications    clotrimazole -betamethasone  cream Commonly known as: LOTRISONE    collagenase  250 UNIT/GM ointment Commonly known as: SANTYL    doxycycline  100 MG tablet Commonly known as: ADOXA   enoxaparin  100 MG/ML injection Commonly known as: LOVENOX    FreeStyle Libre 3 Reader [employer] 3 Sensor Misc   lidocaine -prilocaine  cream Commonly known as: EMLA     Nyamyc  powder Generic drug: nystatin    sodium hypochlorite 0.125 % Soln Commonly known as: DAKIN'S 1/4 STRENGTH       TAKE these medications    acetaminophen  500 MG tablet Commonly known as: TYLENOL  Take 500 mg by mouth daily as needed for moderate pain (pain score 4-6) or headache.   ALPRAZolam  0.25 MG tablet Commonly known as: XANAX  Take 1 tablet (0.25 mg total) by mouth every 8 (eight) hours as needed for anxiety.   atorvastatin  40 MG tablet Commonly known as: LIPITOR Take 40 mg by mouth at bedtime.   baclofen  10 MG tablet Commonly known as: LIORESAL  Take 1 tablet (10 mg total) by mouth 2 (two) times daily.   clopidogrel  75 MG tablet Commonly known as: PLAVIX  Take 75 mg by mouth daily.   cyanocobalamin  1000 MCG tablet Commonly known as: VITAMIN B12 Take 1,000 mcg by mouth daily.   feeding supplement Liqd Take 1 Container by mouth 2 (two) times daily between meals.   ferrous sulfate  325 (65 FE) MG tablet Take 325 mg by mouth every Monday, Wednesday, and Friday.   gabapentin  300 MG capsule Commonly known as: NEURONTIN  Take 300-600 mg by mouth See admin instructions. Take 600 mg by mouth in the morning and 300 mg at bedtime   levothyroxine  150 MCG tablet Commonly known as: SYNTHROID  Take 150 mcg by mouth daily before breakfast.   magnesium  oxide 400 (240 Mg) MG tablet Commonly known as: MAG-OX Take 400 mg by mouth daily.   metFORMIN  750 MG 24 hr tablet Commonly known as: GLUCOPHAGE -XR Take 750 mg by mouth 2 (two) times daily with a meal.   multivitamin tablet Take 1 tablet by mouth daily with breakfast.   NON FORMULARY Take 30 mLs by mouth 2 (two) times daily. Prostate for wound healing   oxyCODONE  10 mg 12 hr tablet Commonly known as: OXYCONTIN  Take 1 tablet (10 mg total) by mouth every 12 (twelve) hours. What changed:  medication strength how much to take   oxyCODONE  5 MG immediate release tablet Commonly known as: Roxicodone  Take 0.5-1  tablets (2.5-5 mg total) by mouth every 4 (four) hours as needed for severe pain (pain score 7-10). What changed: Another medication with the same name was changed. Make sure you understand how and when to take each.   OXYGEN Inhale 2 L into the lungs daily as needed (Sat >90%).   pantoprazole  40 MG tablet Commonly known as: PROTONIX  Take 1 tablet (40 mg total) by mouth daily.   PRESCRIPTION MEDICATION Apply 1 application  topically 3 (three) times daily. House stock barrier cream to buttocks   PRESCRIPTION MEDICATION Take 120 mLs by mouth in the morning, at noon, in the evening, and at bedtime. Offer fluids for hydration and UTI prevention   sertraline  100 MG tablet Commonly known as: ZOLOFT  Take 150 mg by mouth at bedtime.   traMADol  50 MG tablet Commonly known as: ULTRAM  Take 1 tablet (50 mg total) by mouth every 8 (eight) hours as needed for moderate pain (pain score 4-6).   Vitamin D3 Super Strength 50 MCG (2000 UT) Caps Generic  drug: Cholecalciferol  Take 2,000 Units by mouth daily.       ASK your doctor about these medications    cephALEXin  500 MG capsule Commonly known as: KEFLEX  Take 1 capsule (500 mg total) by mouth every 8 (eight) hours for 5 days. Ask about: Should I take this medication?               Discharge Care Instructions  (From admission, onward)           Start     Ordered   07/27/24 0000  Discharge wound care:       Comments: Podiatry as scheduled.   07/27/24 1115            Follow-up Information     Waylan Almarie SAUNDERS, MD Follow up.   Specialty: Family Medicine Contact information: [address] [place] [address] [phone]                Allergies  Allergen Reactions   Codeine Hives and Nausea Only    Consultations: [facility]   Procedures/Studies: VAS US  AORTA/IVC/ILIACS Result Date: 06/29/2024 ABDOMINAL AORTA STUDY Patient Name:  HADEN CAVENAUGH  Date of Exam:   06/29/2024 Medical Rec #:  [id number]         Accession #:    [id number] Date of Birth: [birth year]         Patient Gender: F Patient Age:   78 years Exam Location:  [facility] [address] Procedure:      VAS US  AORTA/IVC/ILIACS Referring Phys: [name] --------------------------------------------------------------------------------  Indications: Patient is s/p left common iliac stenting for left heel ulceration.              Patient's daughter reports the wound looks marginally better but              feels it needs to be debrided again. Risk Factors: Hypertension, Diabetes, past history of smoking, coronary artery               disease, prior CVA. Vascular Interventions: S/P left common iliac stent 05/30/24. Limitations: Air/bowel gas, obesity, patient discomfort and colostomy bag.  Performing Technologist: Edsel Mustard RVT  Examination Guidelines: A complete evaluation includes B-mode imaging, spectral Doppler, color Doppler, and power Doppler as needed of all accessible portions of each vessel. Bilateral testing is considered an integral part of a complete examination. Limited examinations for reoccurring indications may be performed as noted.  Abdominal Aorta Findings: +-------------+-------+----------+----------+----------+--------+--------------+ Location     AP (cm)Trans (cm)PSV (cm/s)Waveform  ThrombusComments       +-------------+-------+----------+----------+----------+--------+--------------+ Proximal                                                  not visualized +-------------+-------+----------+----------+----------+--------+--------------+ Distal                        136                                        +-------------+-------+----------+----------+----------+--------+--------------+ LT CIA Prox                   132                                        +-------------+-------+----------+----------+----------+--------+--------------+  LT CIA Mid                    183                                         +-------------+-------+----------+----------+----------+--------+--------------+ LT CIA Distal                 199                                        +-------------+-------+----------+----------+----------+--------+--------------+ LT EIA Prox                   216       monophasic                       +-------------+-------+----------+----------+----------+--------+--------------+ LT EIA Mid                    367       monophasic        >50% stenosis  +-------------+-------+----------+----------+----------+--------+--------------+ LT EIA Distal                 370       monophasic                       +-------------+-------+----------+----------+----------+--------+--------------+ Visualization of the Proximal Abdominal Aorta, Mid Abdominal Aorta, Left CIA Proximal artery, Left CIA Mid artery, Left CIA Distal artery and Left EIA Proximal artery was limited. The left common iliac artery appears patent, s/p stenting. Visualization was very limited due to overlying colostomy bag. The external iliac artery demonstrates >50% stenosis.  Summary: Technically challenging examination Stenosis: +-------------------+-------------+-----------+------------+ Location           Stenosis     Stent      Comments     +-------------------+-------------+-----------+------------+ Left Common Iliac               no stenosispatent stent +-------------------+-------------+-----------+------------+ Left External Iliac>50% stenosis                        +-------------------+-------------+-----------+------------+ The left common iliac artery appears patent, s/p stenting. Visualization was limited due to overlying colostomy bag and bowel gas. >50% stenosis of the left external iliac artery.  *See table(s) above for measurements and observations.  Electronically signed by [name] MD on 06/29/2024 at 4:41:46 PM.    Final    VAS US  ABI WITH/WO TBI Result Date:  06/29/2024  LOWER EXTREMITY DOPPLER STUDY Patient Name:  KATHLEN SAKURAI  Date of Exam:   06/29/2024 Medical Rec #: [id number]         Accession #:    [id number] Date of Birth: 06/17/1946         Patient Gender: F Patient Age:   66 years Exam Location:  [facility] [address] Procedure:      VAS US  ABI WITH/WO TBI Referring Phys: [name] --------------------------------------------------------------------------------  Indications: Ulceration, and peripheral artery disease. High Risk Factors: Hypertension, Diabetes, past history of smoking, coronary                    artery disease, prior CVA.  Vascular Interventions: S/P left common iliac stent 05/30/24. Comparison Study: ABIs 02/16/24 were  0.85/0.46 on the right and 1.01/absent on the                   left Performing Technologist: Edsel Mustard RVT  Examination Guidelines: A complete evaluation includes at minimum, Doppler waveform signals and systolic blood pressure reading at the level of bilateral brachial, anterior tibial, and posterior tibial arteries, when vessel segments are accessible. Bilateral testing is considered an integral part of a complete examination. Photoelectric Plethysmograph (PPG) waveforms and toe systolic pressure readings are included as required and additional duplex testing as needed. Limited examinations for reoccurring indications may be performed as noted.  ABI Findings: +---------+------------------+-----+---------+--------+ Right    Rt Pressure (mmHg)IndexWaveform Comment  +---------+------------------+-----+---------+--------+ Brachial 175                                      +---------+------------------+-----+---------+--------+ PTA      181               1.03 triphasic         +---------+------------------+-----+---------+--------+ DP       167               0.95 biphasic          +---------+------------------+-----+---------+--------+ Great Toe0                 0.00 Absent             +---------+------------------+-----+---------+--------+ +---------+------------------+-----+----------+-------+ Left     Lt Pressure (mmHg)IndexWaveform  Comment +---------+------------------+-----+----------+-------+ Brachial 174                                      +---------+------------------+-----+----------+-------+ PTA      151               0.86 monophasic        +---------+------------------+-----+----------+-------+ DP       174               0.99 monophasic        +---------+------------------+-----+----------+-------+ Great Toe76                0.43 Abnormal          +---------+------------------+-----+----------+-------+ +-------+-----------+-----------+------------+------------+ ABI/TBIToday's ABIToday's TBIPrevious ABIPrevious TBI +-------+-----------+-----------+------------+------------+ Right  1.03       0          0.85        0.46         +-------+-----------+-----------+------------+------------+ Left   .99        0.43       1.01        0            +-------+-----------+-----------+------------+------------+  Summary: Right: Resting right ankle-brachial index is within normal range. The right toe-brachial index is abnormal. Left: Resting left ankle-brachial index is within normal range. The left toe-brachial index is abnormal. Although ankle brachial indices are within normal limits (0.95-1.29), arterial Doppler waveforms at the ankle suggest some component of arterial occlusive disease. *See table(s) above for measurements and observations.  Electronically signed by [name] MD on 06/29/2024 at 4:41:10 PM.    Final     Subjective: Patient was seen and examined at bedside. Overnight events noted. Patient reports feeling better and wants to be discharged. She is fully awake and oriented.  Discharge Exam: Vitals:   07/27/24  0402 07/27/24 0759  BP: (!) 146/66 (!) 133/36  Pulse: 65 63  Resp: 16 15  Temp: 98.8 F (37.1 C) 98.6 F (37 C)   SpO2: 96% 95%   Vitals:   07/26/24 1730 07/26/24 2004 07/27/24 0402 07/27/24 0759  BP: (!) 156/132 (!) 143/35 (!) 146/66 (!) 133/36  Pulse: 66 64 65 63  Resp: 16 15 16 15   Temp: 97.7 F (36.5 C) 98.7 F (37.1 C) 98.8 F (37.1 C) 98.6 F (37 C)  TempSrc:   Axillary   SpO2: 98% 96% 96% 95%  Weight:      Height:        General: Pt is alert, awake, not in acute distress Cardiovascular: RRR, S1/S2 +, no rubs, no gallops Respiratory: CTA bilaterally, no wheezing, no rhonchi Abdominal: Soft, NT, ND, bowel sounds + Extremities: no edema, no cyanosis, Left BKA    The results of significant diagnostics from this hospitalization (including imaging, microbiology, ancillary and laboratory) are listed below for reference.     Microbiology: No results found for this or any previous visit (from the past 240 hours).   Labs: BNP (last 3 results) No results for input(s): BNP in the last 8760 hours. Basic Metabolic Panel: Recent Labs  Lab 07/21/24 0206 07/26/24 0450  NA 141 143  K 4.1 3.4*  CL 104 111  CO2 25 24  GLUCOSE 149* 137*  BUN 12 12  CREATININE 0.81 0.75  CALCIUM  8.3* 7.7*  MG 1.7  --    Liver Function Tests: Recent Labs  Lab 07/26/24 0450  AST 22  ALT 21  ALKPHOS 115  BILITOT 0.6  PROT 5.3*  ALBUMIN 2.1*   No results for input(s): LIPASE, AMYLASE in the last 168 hours. No results for input(s): AMMONIA in the last 168 hours. CBC: Recent Labs  Lab 07/21/24 0206 07/26/24 0450  WBC 5.9 5.3  HGB 8.0* 8.8*  HCT 27.7* 30.7*  MCV 87.7 90.6  PLT 201 206   Cardiac Enzymes: No results for input(s): CKTOTAL, CKMB, CKMBINDEX, TROPONINI in the last 168 hours. BNP: Invalid input(s): POCBNP CBG: Recent Labs  Lab 07/26/24 2153 07/27/24 0014 07/27/24 0624 07/27/24 0946 07/27/24 1229  GLUCAP 95 143* 186* 176* 116*   D-Dimer No results for input(s): DDIMER in the last 72 hours. Hgb A1c No results for input(s): HGBA1C in the last 72  hours. Lipid Profile No results for input(s): CHOL, HDL, LDLCALC, TRIG, CHOLHDL, LDLDIRECT in the last 72 hours. Thyroid  function studies No results for input(s): TSH, T4TOTAL, T3FREE, THYROIDAB in the last 72 hours.  Invalid input(s): FREET3 Anemia work up No results for input(s): VITAMINB12, FOLATE, FERRITIN, TIBC, IRON, RETICCTPCT in the last 72 hours. Urinalysis    Component Value Date/Time   COLORURINE STRAW (A) 07/19/2024 0520   APPEARANCEUR CLEAR 07/19/2024 0520   LABSPEC 1.004 (L) 07/19/2024 0520   PHURINE 5.0 07/19/2024 0520   GLUCOSEU NEGATIVE 07/19/2024 0520   HGBUR MODERATE (A) 07/19/2024 0520   BILIRUBINUR NEGATIVE 07/19/2024 0520   KETONESUR NEGATIVE 07/19/2024 0520   PROTEINUR 30 (A) 07/19/2024 0520   UROBILINOGEN 0.2 05/27/2014 1203   NITRITE NEGATIVE 07/19/2024 0520   LEUKOCYTESUR SMALL (A) 07/19/2024 0520   Sepsis Labs Recent Labs  Lab 07/21/24 0206 07/26/24 0450  WBC 5.9 5.3   Microbiology No results found for this or any previous visit (from the past 240 hours).   Time coordinating discharge: Over 30 minutes  SIGNED:   Darcel Dawley, MD  [facility] 07/27/2024, 1:03 PM  Pager   If 7PM-7AM, please contact night-coverage

## 2024-07-27 NOTE — TOC Transition Note (Signed)
 Transition of Care [facility]) - Discharge Note   Patient Details  Name: [name] MRN: [id number] Date of Birth: 1946/04/08  [facility] CM/SW Contact:  Bridget Cordella Simmonds, LCSW Phone Number: 07/27/2024, 1:11 PM   Clinical Narrative:   Pt discharging to [facility].  RN call report to [phone].  PTAR called [phone].     Final next level of care: Skilled Nursing Facility Barriers to Discharge: Barriers Resolved   Patient Goals and CMS Choice Patient states their goals for this hospitalization and ongoing recovery are:: walking   Choice offered to / list presented to : Patient (return to [facility])      Discharge Placement              Patient chooses bed at: [facility], [place] Patient to be transferred to facility by: ptar Name of family member notified: daughter [name] by phone, son Levander (in room) Patient and family notified of of transfer: 07/27/24  Discharge Plan and Services Additional resources added to the After Visit Summary for   In-house Referral: Clinical Social Work   Post Acute Care Choice: Skilled Nursing Facility                               Social Drivers of Health (SDOH) Interventions SDOH Screenings   Food Insecurity: No Food Insecurity (07/12/2024)  Housing: Low Risk  (07/12/2024)  Transportation Needs: No Transportation Needs (07/12/2024)  Utilities: Not At Risk (07/12/2024)  Depression (PHQ2-9): Low Risk  (06/23/2024)  Social Connections: Unknown (07/12/2024)  Tobacco Use: Medium Risk (07/12/2024)     Readmission Risk Interventions    10/15/2023    9:49 AM 09/11/2023   11:36 AM 12/02/2021    1:55 PM  Readmission Risk Prevention Plan  Transportation Screening Complete Complete Complete  PCP or Specialist Appt within 3-5 Days  Complete   HRI or Home Care Consult  Complete   Social Work Consult for Recovery Care Planning/Counseling  Complete   Palliative Care Screening  Not Applicable   Medication Review Furniture conservator/restorer) Referral to Pharmacy Complete Complete  PCP or Specialist appointment within 3-5 days of discharge Complete  Complete  HRI or Home Care Consult Complete  Complete  SW Recovery Care/Counseling Consult Complete  Complete  Palliative Care Screening Not Applicable  Not Applicable  Skilled Nursing Facility Not Applicable  Patient Refused

## 2024-07-27 NOTE — TOC Progression Note (Addendum)
 Transition of Care [facility]) - Progression Note    Patient Details  Name: BRETA DEMEDEIROS MRN: [id number] Date of Birth: [birth year]  [facility] CM/SW Contact  Bridget Cordella Simmonds, LCSW Phone Number: 07/27/2024, 8:49 AM  Clinical Narrative:   CSW LM with Sherri/[employer] medicare RN for the appeal, [phone].  Requested status update.    0915: TC Sherri[employer]: she cannot provide any update on appeal status, need to call [phone].  1000: CSW called the above number, spoke with Richardson R, who initially tried to tell CSW to call yet another number.  When I pushed back a bit about the entire appeal process being very long and multiple instances of being asked by everyone to call someone else, she put CSW on hold, came back and said the appeal was successful, SNF auth approved for [facility], 9/9-9/15, [id number].  CSW provided that info to  [place]/[facility] and she confirmed they can receive pt today.  MD informed.   Expected Discharge Plan: Skilled Nursing Facility Barriers to Discharge: Continued Medical Work up               Expected Discharge Plan and Services In-house Referral: Clinical Social Work   Post Acute Care Choice: Skilled Nursing Facility Living arrangements for the past 2 months: Single Family Home Expected Discharge Date: 07/17/24                                     Social Drivers of Health (SDOH) Interventions SDOH Screenings   Food Insecurity: No Food Insecurity (07/12/2024)  Housing: Low Risk  (07/12/2024)  Transportation Needs: No Transportation Needs (07/12/2024)  Utilities: Not At Risk (07/12/2024)  Depression (PHQ2-9): Low Risk  (06/23/2024)  Social Connections: Unknown (07/12/2024)  Tobacco Use: Medium Risk (07/12/2024)    Readmission Risk Interventions    10/15/2023    9:49 AM 09/11/2023   11:36 AM 12/02/2021    1:55 PM  Readmission Risk Prevention Plan  Transportation Screening Complete Complete Complete  PCP or Specialist  Appt within 3-5 Days  Complete   HRI or Home Care Consult  Complete   Social Work Consult for Recovery Care Planning/Counseling  Complete   Palliative Care Screening  Not Applicable   Medication Review Oceanographer) Referral to Pharmacy Complete Complete  PCP or Specialist appointment within 3-5 days of discharge Complete  Complete  HRI or Home Care Consult Complete  Complete  SW Recovery Care/Counseling Consult Complete  Complete  Palliative Care Screening Not Applicable  Not Applicable  Skilled Nursing Facility Not Applicable  Patient Refused

## 2024-07-27 NOTE — Inpatient Diabetes Management (Signed)
 Inpatient Diabetes Program Recommendations  AACE/ADA: New Consensus Statement on Inpatient Glycemic Control (2015)  Target Ranges:  Prepandial:   less than 140 mg/dL      Peak postprandial:   less than 180 mg/dL (1-2 hours)      Critically ill patients:  140 - 180 mg/dL   Lab Results  Component Value Date   GLUCAP 176 (H) 07/27/2024   HGBA1C 6.0 (H) 07/12/2024    Review of Glycemic Control  Latest Reference Range & Units 07/26/24 10:46 07/26/24 16:32 07/26/24 21:23  Glucose-Capillary 70 - 99 mg/dL 834 (H) 728 (H) 59 (L)  (H): Data is abnormally high (L): Data is abnormally low   Inpatient Diabetes Program Recommendations:    If she remains inpatient, please consider decreasing correction:  Novolog  0-9 units TID.  Thank you, Wyvonna Pinal, MSN, CDCES Diabetes Coordinator Inpatient Diabetes Program [phone] (team pager from 8a-5p)

## 2024-08-01 ENCOUNTER — Telehealth: Payer: Self-pay | Admitting: Oncology

## 2024-08-01 NOTE — Telephone Encounter (Signed)
 PT's daughter called to reschedule appts now that PT is out of the hospital. Day and time confirmed.

## 2024-08-05 ENCOUNTER — Other Ambulatory Visit: Payer: Self-pay

## 2024-08-05 DIAGNOSIS — C20 Malignant neoplasm of rectum: Secondary | ICD-10-CM

## 2024-08-08 ENCOUNTER — Inpatient Hospital Stay: Attending: Nurse Practitioner | Admitting: Nurse Practitioner

## 2024-08-08 ENCOUNTER — Encounter: Payer: Self-pay | Admitting: Nurse Practitioner

## 2024-08-08 ENCOUNTER — Other Ambulatory Visit

## 2024-08-08 ENCOUNTER — Other Ambulatory Visit: Payer: Self-pay | Admitting: Nurse Practitioner

## 2024-08-08 ENCOUNTER — Telehealth: Payer: Self-pay

## 2024-08-08 ENCOUNTER — Inpatient Hospital Stay

## 2024-08-08 VITALS — BP 152/64 | HR 72 | Temp 98.1°F | Resp 18 | Ht 60.0 in | Wt 158.0 lb

## 2024-08-08 DIAGNOSIS — Z85048 Personal history of other malignant neoplasm of rectum, rectosigmoid junction, and anus: Secondary | ICD-10-CM | POA: Insufficient documentation

## 2024-08-08 DIAGNOSIS — C20 Malignant neoplasm of rectum: Secondary | ICD-10-CM

## 2024-08-08 LAB — CMP (CANCER CENTER ONLY)
ALT: 34 U/L (ref 0–44)
AST: 31 U/L (ref 15–41)
Albumin: 3.7 g/dL (ref 3.5–5.0)
Alkaline Phosphatase: 172 U/L — ABNORMAL HIGH (ref 38–126)
Anion gap: 14 (ref 5–15)
BUN: 20 mg/dL (ref 8–23)
CO2: 24 mmol/L (ref 22–32)
Calcium: 10.1 mg/dL (ref 8.9–10.3)
Chloride: 104 mmol/L (ref 98–111)
Creatinine: 0.67 mg/dL (ref 0.44–1.00)
GFR, Estimated: >60 mL/min (ref >60)
Glucose, Bld: 214 mg/dL — ABNORMAL HIGH (ref 70–99)
Potassium: 3.8 mmol/L (ref 3.5–5.1)
Sodium: 142 mmol/L (ref 135–145)
Total Bilirubin: 0.4 mg/dL (ref 0.0–1.2)
Total Protein: 6.9 g/dL (ref 6.5–8.1)

## 2024-08-08 LAB — CBC WITH DIFFERENTIAL (CANCER CENTER ONLY)
Abs Immature Granulocytes: 0.07 K/uL (ref 0.00–0.07)
Basophils Absolute: 0 K/uL (ref 0.0–0.1)
Basophils Relative: 0 %
Eosinophils Absolute: 0 K/uL (ref 0.0–0.5)
Eosinophils Relative: 0 %
HCT: 33 % — ABNORMAL LOW (ref 36.0–46.0)
Hemoglobin: 10.1 g/dL — ABNORMAL LOW (ref 12.0–15.0)
Immature Granulocytes: 1 %
Lymphocytes Relative: 9 %
Lymphs Abs: 0.7 K/uL (ref 0.7–4.0)
MCH: 25.9 pg — ABNORMAL LOW (ref 26.0–34.0)
MCHC: 30.6 g/dL (ref 30.0–36.0)
MCV: 84.6 fL (ref 80.0–100.0)
Monocytes Absolute: 0.5 K/uL (ref 0.1–1.0)
Monocytes Relative: 6 %
Neutro Abs: 7.2 K/uL (ref 1.7–7.7)
Neutrophils Relative %: 84 %
Platelet Count: 254 K/uL (ref 150–400)
RBC: 3.9 MIL/uL (ref 3.87–5.11)
RDW: 17.2 % — ABNORMAL HIGH (ref 11.5–15.5)
WBC Count: 8.6 K/uL (ref 4.0–10.5)
nRBC: 0 % (ref 0.0–0.2)

## 2024-08-08 LAB — MAGNESIUM: Magnesium: 0.9 mg/dL — CL (ref 1.7–2.4)

## 2024-08-08 NOTE — Patient Instructions (Signed)

## 2024-08-08 NOTE — Telephone Encounter (Signed)
-----   Message from Olam Ned sent at 08/08/2024 11:54 AM EDT ----- Please call her daughter-magnesium  level is low.  Recommend IV magnesium .  Let me know what day she will be available and infusion has openings and I will enter the orders.

## 2024-08-08 NOTE — Telephone Encounter (Signed)
 This Clinical research associate just got off the phone with [name] patient's daughter and she stated that she will call clinic back about her mom coming back to get the recommended IV magnesium .

## 2024-08-08 NOTE — Telephone Encounter (Signed)
 Patient is schedule on 08/11/24 for IV magnesium  .

## 2024-08-08 NOTE — Progress Notes (Signed)
 [facility] OFFICE PROGRESS NOTE   Diagnosis: Rectal cancer  INTERVAL HISTORY:   Ms. Pardue returns for follow-up.  She was hospitalized 07/12/2024 through 07/19/2024 with critical limb ischemia with left heel ulceration and nonhealing heel wound.  She underwent BKA.  She is currently at a skilled nursing facility.  She is accompanied by her daughter.  They report she is getting stronger.  She is working on assisting with transfers.  Colostomy functioning normally.  She denies bleeding.  Describes appetite as okay.  Denies abdominal pain.  No nausea or vomiting.  Daughter reports she has a bedsore and is being followed by a skin care team.  Objective:  Vital signs in last 24 hours:  Blood pressure (!) 152/64, pulse 72, temperature 98.1 F (36.7 C), temperature source Temporal, resp. rate 18, height 5' (1.524 m), weight 158 lb (71.7 kg), SpO2 98%.    HEENT: Mild white coating over tongue.  No buccal thrush. Resp: Lungs clear bilaterally. Cardio: Regular rate and rhythm. GI: No hepatosplenomegaly.  Nontender.  Left lower quadrant colostomy.  Semiformed stool in the collection bag. Vascular: No right leg edema.  Status post left BKA.  Staples intact, no erythema. Skin: Declined examination of bedsore due to inability to stand. Port-A-Cath without erythema.  Lab Results:  Lab Results  Component Value Date   WBC 8.6 08/08/2024   HGB 10.1 (L) 08/08/2024   HCT 33.0 (L) 08/08/2024   MCV 84.6 08/08/2024   PLT 254 08/08/2024   NEUTROABS 7.2 08/08/2024    Imaging:  No results found.  Medications: I have reviewed the patient's current medications.  Assessment/Plan: Rectal cancer Partially obstructing mass beginning at 10 cm from the anal verge on colonoscopy 01/23/2021, biopsy-adenocarcinoma, immunohistochemical stains at [facility]-CK20, CDX-2, and SATB2 positive, patchy strong staining for p16.  The rectal tumor has a distinct immunohistochemical profile suggesting a  primary colorectal tumor as opposed to metastatic cervical cancer. CT abdomen/pelvis 01/18/2021-bilateral hydronephrosis, endometrial fluid collection, prominent stool in the colon MRI pelvis 01/23/2021-tumor at 7.6 cm from the anal verge, abnormal signal bridge in the cervix, mesorectum, and anterior aspect of the rectum-similar to MRI from 2020, MRI stage could not be defined secondary to posttreatment changes in the pelvis 03/18/2021-total abdominal hysterectomy, cystoscopy with insertion of ureteral stents, exploratory laparotomy, aborted low anterior resection, descending loop colostomy--right and left fallopian tubes and ovaries negative for malignancy; excision portion of cervix positive for adenocarcinoma; uterus with invasive moderately differentiated adenocarcinoma of the cervix, HPV associated, invading through full-thickness of the cervix/lower uterine segment and into parametrial tissue, margins of resection received disrupted, cannot be evaluated; remaining uterus with extensive endocervical adenocarcinoma in situ involving lower uterine segment and replacing endometrium.  This carcinoma was felt to be a separate primary from the rectal tumor. PET scan at [facility] 04/11/2021-intense FDG activity in region of known rectal mass.  Hypermetabolic left external iliac and right inguinal lymph nodes.  Minimal FDG uptake and small right supraclavicular lymph node.  No uptake seen in the cervix. 04/23/2021 FNA right inguinal lymph node-negative for malignancy Evaluated by Dr. Markham 05/21/2021, 05/28/2021-appears to have 2 synchronous malignancies (cervical adenocarcinoma and rectal adenocarcinoma); further surgery which would entail pelvic exenteration not recommended by gynecologic oncology or colorectal surgery.  Full doses of radiotherapy not felt to be feasible.  Recommended 3 months of CAPOX and then repeat MRI of abdomen/pelvis, chest CT and colonoscopy. Cycle 1 Xeloda 06/13/2021 Cycle 1 FOLFOX 07/08/2021,  oxaliplatin  dose reduced secondary to pre-existing neuropathy Cycle 2  FOLFOX 07/29/2021, Udenyca  Cycle 3 FOLFOX 08/12/2021, Udenyca  Cycle 4 FOLFOX 09/02/2021, Udenyca  Cycle 5 FOLFOX 09/17/2021, Udenyca  CTs at [facility] 10/01/2021-unchanged findings of rectal malignancy status posttreatment.  Marked thickening of the proximal duodenal wall.  Unchanged bilateral inguinal lymph nodes which are not enlarged but avid on prior PET.  Unchanged diffuse bladder wall thickening and perivesicular stranding which may represent radiation cystitis.  New mild right lower lobe bronchial wall thickening, mucus impaction and tree-in-bud pulmonary nodules. Cycle 6 FOLFOX 10/22/2021, Udenyca  CT 01/21/2022-stable soft tissue thickening at the lower rectum, slight increase in adjacent stranding no evidence of metastatic disease in the chest, abdomen, or pelvis, interval diverting loop colostomy CT pelvis and lower extremities 04/22/2023-subcutaneous fat stranding in the lower legs bilaterally on the right greater than left, enlarged right inguinal lymph nodes, total left hip arthroplasty with stable acetabular bone erosions, ill-defined soft tissue density in the region of the distal rectum with surrounding fat stranding unchanged     Bilateral hydronephrosis-secondary to urinary retention? Hematometra found on pelvic MRI 01/23/2021, evaluated by gynecologic oncology Cervical cancer November 1994, stage Ib, treated with external beam radiation and brachytherapy at [facility] Diabetes Neuropathy Chronic pain secondary to #6 Recurrent urinary tract infections History of a CVA History of hyperthyroidism G3, P3 COVID-19 infection January 2021 Admission 08/25/2021 with a urinary tract infection-culture negative Anemia secondary to chronic disease, chemotherapy, and rectal bleeding-1 unit packed red blood cells 08/26/2021, 02/25/2024 Hospital admission 09/23/2021-symptomatic anemia 16.  Admission 10/08/2021 with altered mental status,  nausea/vomiting, and left foot ulcers Urine culture 10/08/2021 positive for Pseudomonas aeruginosa EGD 10/12/2021-gastric and duodenal ulcers-not bleeding 17.  Admission 11/29/2021 through 12/02/2021 with COVID-pneumonia 18.  Right lower extremity cellulitis 04/22/2023-Bactrim , repeat course of Bactrim  05/07/2023 19.  Admission 09/10/2023 with severe symptomatic anemia, dark stool-Hemoccult positive 3 units packed red blood cells Upper endoscopy 09/11/2023: Duodenal ulcers with strictures likely source of GI bleeding, no active bleeding. 20.  Chronic hypomagnesemia-etiology unclear 21.  05/27/2024: Open reduction/internal fixation of left prosthetic femur fracture 22.  05/30/2024: Left common iliac artery stent 23.   Nonhealing left heel ulcer-status post BKA 07/12/2024  Disposition: Ms. Oldenburg appears stable.  She has a history of locally advanced rectal cancer.  There is no clinical evidence of disease progression.    She continues recovery from the BKA last month.  Currently residing in a skilled nursing facility with the plan to return home once she is able to assist with transfers.  She reports a bedsore at today's visit.  We were unable to examine.  Her daughter reports she is being followed by a skin care team at the skilled nursing facility.  She has chronic hypomagnesemia of unclear etiology.  She has been referred to nephrology.  We will follow-up on the magnesium  level from today.  She will return for lab and follow-up in 6 weeks.  We are available to see her sooner if needed.  Plan reviewed with Dr. Cloretta.   Olam Ned ANP/GNP-BC   08/08/2024  10:49 AM

## 2024-08-08 NOTE — Progress Notes (Signed)
 CRITICAL VALUE STICKER  CRITICAL VALUE: Mg+ 0.9  RECEIVER (on-site recipient of call): Cheron Pasquarelli,RN  DATE & TIME NOTIFIED: 08/08/24 @ 1150   MESSENGER (representative from lab):James  MD NOTIFIED: Olam Ned, NP  TIME OF NOTIFICATION: 1152  RESPONSE:

## 2024-08-10 ENCOUNTER — Telehealth: Payer: Self-pay | Admitting: Oncology

## 2024-08-10 NOTE — Telephone Encounter (Signed)
 NURSING HOME CALLED TO VERIFY APPT FOR 9/25. DAY AND TIME CONFIRMED.

## 2024-08-11 ENCOUNTER — Ambulatory Visit

## 2024-08-11 DIAGNOSIS — C20 Malignant neoplasm of rectum: Secondary | ICD-10-CM

## 2024-08-11 MED ORDER — MAGNESIUM SULFATE 4 GM/100ML IV SOLN
4.0000 g | Freq: Once | INTRAVENOUS | Status: AC
  Administered 2024-08-11: 4 g via INTRAVENOUS
  Filled 2024-08-11: qty 100

## 2024-08-11 NOTE — Patient Instructions (Signed)
 Hypomagnesemia Hypomagnesemia is a condition in which the level of magnesium  in the blood is too low. Magnesium  is a mineral that is found in many foods. It is used in many different processes in the body. Hypomagnesemia can affect every organ in the body. In severe cases, it can cause life-threatening problems. What are the causes? This condition may be caused by: Not getting enough magnesium  in your diet or not having enough healthy foods to eat (malnutrition). Problems with magnesium  absorption in the intestines. Dehydration. Excessive use of alcohol. Vomiting. Severe or long-term (chronic) diarrhea. Some medicines, including medicines that make you urinate more often (diuretics). Certain diseases, such as kidney disease, diabetes, celiac disease, and overactive thyroid . What are the signs or symptoms? Symptoms of this condition include: Loss of appetite, nausea, and vomiting. Involuntary shaking or trembling of a body part (tremor). Muscle weakness or tingling in the arms and legs. Sudden tightening of muscles (muscle spasms). Confusion. Psychiatric issues, such as: Depression and irritability. Psychosis. A feeling of fluttering of the heart (palpitations). Seizures. These symptoms are more severe if magnesium  levels drop suddenly. How is this diagnosed? This condition may be diagnosed based on: Your symptoms and medical history. A physical exam. Blood and urine tests. How is this treated? Treatment depends on the cause and the severity of the condition. It may be treated by: Taking a magnesium  supplement. This can be taken in pill form. If the condition is severe, magnesium  is usually given through an IV. Making changes to your diet. You may be directed to eat foods that have a lot of magnesium , such as green leafy vegetables, peas, beans, and nuts. Not drinking alcohol. If you are struggling not to drink, ask your health care provider for help. Follow these instructions at  home: Eating and drinking     Make sure that your diet includes foods with magnesium . Foods that have a lot of magnesium  in them include: Green leafy vegetables, such as spinach and broccoli. Beans and peas. Nuts and seeds, such as almonds and sunflower seeds. Whole grains, such as whole grain bread and fortified cereals. Drink fluids that contain salts and minerals (electrolytes), such as sports drinks, when you are active. Do not drink alcohol. General instructions Take over-the-counter and prescription medicines only as told by your health care provider. Take magnesium  supplements as directed if your health care provider tells you to take them. Have your magnesium  levels monitored as told by your health care provider. Keep all follow-up visits. This is important. Contact a health care provider if: You get worse instead of better. Your symptoms return. Get help right away if: You develop severe muscle weakness. You have trouble breathing. You feel that your heart is racing. These symptoms may represent a serious problem that is an emergency. Do not wait to see if the symptoms will go away. Get medical help right away. Call your local emergency services (911 in the [place] Do not drive yourself to the hospital. Summary Hypomagnesemia is a condition in which the level of magnesium  in the blood is too low. Hypomagnesemia can affect every organ in the body. Treatment may include eating more foods that contain magnesium , taking magnesium  supplements, and not drinking alcohol. Have your magnesium  levels monitored as told by your health care provider. This information is not intended to replace advice given to you by your health care provider. Make sure you discuss any questions you have with your health care provider. Document Revised: 04/02/2021 Document Reviewed: 04/02/2021 Elsevier Patient Education  2024 [employer] Inc[employer]

## 2024-08-17 ENCOUNTER — Ambulatory Visit: Attending: Vascular Surgery | Admitting: Physician Assistant

## 2024-08-17 VITALS — BP 149/64 | HR 83 | Temp 97.3°F | Resp 22 | Ht 60.0 in | Wt 158.0 lb

## 2024-08-17 DIAGNOSIS — Z89512 Acquired absence of left leg below knee: Secondary | ICD-10-CM

## 2024-08-17 DIAGNOSIS — I739 Peripheral vascular disease, unspecified: Secondary | ICD-10-CM

## 2024-08-17 DIAGNOSIS — L89891 Pressure ulcer of other site, stage 1: Secondary | ICD-10-CM | POA: Diagnosis not present

## 2024-08-17 NOTE — Progress Notes (Signed)
 POST OPERATIVE OFFICE NOTE    CC:  F/u for surgery  HPI:  This is a 78 y.o. female who is s/p left BKA on 07/12/24 by Dr. Sheree. She had previously undergone left CIA stenting for heel ulceration but the heel ulceration progressed and foot was felt to be non salvageable. She did well post operatively and was discharged to SNF.  Pt returns today for follow up with her daughter. She is still residing at [facility]. She says she is doing well after her left BKA. She denies any pain. She is working with PT and says overall it is going well. She has a very painful sacral decubitus ulcer that is giving her the most trouble and limiting her tolerance to do a lot of activity. She also since yesterday developed a red spot on her right heel. She denies any pain in this area. Daughter is concerned because this is how her left leg started. She is medically managed on Statin and Plavix .   Allergies  Allergen Reactions   Codeine Hives and Nausea Only    Current Outpatient Medications  Medication Sig Dispense Refill   acetaminophen  (TYLENOL ) 500 MG tablet Take 500 mg by mouth daily as needed for moderate pain (pain score 4-6) or headache.     ALPRAZolam  (XANAX ) 0.25 MG tablet Take 1 tablet (0.25 mg total) by mouth every 8 (eight) hours as needed for anxiety. 15 tablet 0   Amino Acids-Protein Hydrolys (FEEDING SUPPLEMENT, PRO-STAT 64,) LIQD Take 30 mLs by mouth 3 (three) times daily with meals.     atorvastatin  (LIPITOR) 40 MG tablet Take 40 mg by mouth at bedtime.     baclofen  (LIORESAL ) 10 MG tablet Take 1 tablet (10 mg total) by mouth 2 (two) times daily. 30 each 0   Cholecalciferol  (VITAMIN D3 SUPER STRENGTH) 50 MCG (2000 UT) CAPS Take 2,000 Units by mouth daily.     clopidogrel  (PLAVIX ) 75 MG tablet Take 75 mg by mouth daily.     cyanocobalamin  (VITAMIN B12) 1000 MCG tablet Take 1,000 mcg by mouth daily.     feeding supplement (BOOST HIGH PROTEIN) LIQD Take 1 Container by mouth 2 (two) times daily between  meals.     ferrous sulfate  325 (65 FE) MG tablet Take 325 mg by mouth every Monday, Wednesday, and Friday.     gabapentin  (NEURONTIN ) 300 MG capsule Take 300-600 mg by mouth See admin instructions. Take 600 mg by mouth in the morning and 300 mg at bedtime     levothyroxine  (SYNTHROID ) 150 MCG tablet Take 150 mcg by mouth daily before breakfast.     magnesium  oxide (MAG-OX) 400 (240 Mg) MG tablet Take 400 mg by mouth daily.     metFORMIN  (GLUCOPHAGE -XR) 750 MG 24 hr tablet Take 750 mg by mouth 2 (two) times daily with a meal.     Multiple Vitamin (MULTIVITAMIN) tablet Take 1 tablet by mouth daily with breakfast.     oxyCODONE  (OXYCONTIN ) 10 mg 12 hr tablet Take 1 tablet (10 mg total) by mouth every 12 (twelve) hours. 14 tablet 0   oxyCODONE  (ROXICODONE ) 5 MG immediate release tablet Take 0.5-1 tablets (2.5-5 mg total) by mouth every 4 (four) hours as needed for severe pain (pain score 7-10). 20 tablet 0   pantoprazole  (PROTONIX ) 40 MG tablet Take 1 tablet (40 mg total) by mouth daily. 90 tablet 0   sertraline  (ZOLOFT ) 100 MG tablet Take 150 mg by mouth at bedtime.     traMADol  (ULTRAM ) 50 MG  tablet Take 1 tablet (50 mg total) by mouth every 8 (eight) hours as needed for moderate pain (pain score 4-6). 15 tablet 0   No current facility-administered medications for this visit.   Facility-Administered Medications Ordered in Other Visits  Medication Dose Route Frequency Provider Last Rate Last Admin   sodium chloride  flush (NS) 0.9 % injection 10 mL  10 mL Intravenous PRN Cloretta Arley NOVAK, MD   10 mL at 03/30/24 1158     ROS:  See HPI  Physical Exam:  Vitals:   08/17/24 1351  BP: (!) 149/64  Pulse: 83  Resp: (!) 22  Temp: (!) 97.3 F (36.3 C)  SpO2: 96%    General: WDWN, in no distress Incision:  Left BKA healing well. Staples removed Extremities:  Right biphasic DP/ PT/ Peroneal signals. Foot warm. Some bruising/ erythema of right heel. Stage 1 pressure wound Neuro: alert and  oriented  Assessment/Plan:  This is a 78 y.o. female who is s/p: left BKA on 07/12/24 by Dr. Sheree. She had previously undergone left CIA stenting for heel ulceration but the heel ulceration progressed and foot was felt to be non salvageable. Her left BKA is healing very well and she is without any pain. Staples removed today. Will make referral for [facility] for prosthesis.  Right foot with new pressure wound on right heel. Doppler signals present in DP/ PT/ Pero. On recent angiogram in July she had some mild SFA disease and three vessel runoff with diminutive peroneal artery  - I will have her return in 1-2 weeks with ABI and RLE arterial duplex - Advised her to float heel off of floor, bed, etc - Pending non invasive studies she may need repeat angiography  The patient has a left Below Knee Amputation. The patient is well motivated to return to their prior functional status by utilizing a prosthesis to perform ADL's and maintain a healthy lifestyle. The patient has the physical and cognitive capacity to function with a prosthesis.   Functional Level: K2 Limited Community Ambulator: Has the ability or potential for ambulation and to traverse low environmental barriers such as curbs, stairs or uneven surfaces  Residual Limb History: The skin condition of the residual limb is healthy. The patient will continue to monitor the skin of the residual limb and follow hygiene instructions.  The patient is experiencing no pain related to amputation  Prosthetic Prescription Plan: Counseling and education regarding prosthetic management will be provided to the patient via a certified prosthetist. A multi-discipline team, including physical therapy, will manage the prosthetic fabrication, fitting and prosthetic gait training.    Curry Damme, [facility] [phone]   Clinic MD:  Sheree

## 2024-08-18 ENCOUNTER — Other Ambulatory Visit: Payer: Self-pay | Admitting: *Deleted

## 2024-08-18 DIAGNOSIS — I739 Peripheral vascular disease, unspecified: Secondary | ICD-10-CM

## 2024-08-19 ENCOUNTER — Other Ambulatory Visit: Payer: Self-pay

## 2024-08-25 ENCOUNTER — Encounter (HOSPITAL_COMMUNITY): Payer: Self-pay

## 2024-08-25 ENCOUNTER — Emergency Department (HOSPITAL_COMMUNITY)
Admission: EM | Admit: 2024-08-25 | Discharge: 2024-08-25 | Disposition: A | Discharge status: Skilled Nursing Facility | Attending: Emergency Medicine | Admitting: Emergency Medicine

## 2024-08-25 ENCOUNTER — Emergency Department (HOSPITAL_COMMUNITY)

## 2024-08-25 DIAGNOSIS — Z7984 Long term (current) use of oral hypoglycemic drugs: Secondary | ICD-10-CM | POA: Diagnosis not present

## 2024-08-25 DIAGNOSIS — T8131XA Disruption of external operation (surgical) wound, not elsewhere classified, initial encounter: Secondary | ICD-10-CM | POA: Insufficient documentation

## 2024-08-25 DIAGNOSIS — S3091XA Unspecified superficial injury of lower back and pelvis, initial encounter: Secondary | ICD-10-CM | POA: Insufficient documentation

## 2024-08-25 DIAGNOSIS — I509 Heart failure, unspecified: Secondary | ICD-10-CM | POA: Insufficient documentation

## 2024-08-25 DIAGNOSIS — Z7902 Long term (current) use of antithrombotics/antiplatelets: Secondary | ICD-10-CM | POA: Diagnosis not present

## 2024-08-25 DIAGNOSIS — E119 Type 2 diabetes mellitus without complications: Secondary | ICD-10-CM | POA: Insufficient documentation

## 2024-08-25 DIAGNOSIS — Z87891 Personal history of nicotine dependence: Secondary | ICD-10-CM | POA: Diagnosis not present

## 2024-08-25 DIAGNOSIS — W010XXA Fall on same level from slipping, tripping and stumbling without subsequent striking against object, initial encounter: Secondary | ICD-10-CM | POA: Diagnosis not present

## 2024-08-25 DIAGNOSIS — L89159 Pressure ulcer of sacral region, unspecified stage: Secondary | ICD-10-CM

## 2024-08-25 DIAGNOSIS — W19XXXA Unspecified fall, initial encounter: Secondary | ICD-10-CM

## 2024-08-25 MED ORDER — OXYCODONE-ACETAMINOPHEN 5-325 MG PO TABS
1.0000 | ORAL_TABLET | Freq: Once | ORAL | Status: AC
  Administered 2024-08-25: 1 via ORAL
  Filled 2024-08-25: qty 1

## 2024-08-25 NOTE — ED Provider Notes (Signed)
 [place] EMERGENCY DEPARTMENT AT [facility] Provider Note   CSN: [id number] Arrival date & time: 08/25/24  9566     Patient presents with: [place]   [name] is a 78 y.o. female.  Patient with past medical history significant for type II DM, left [facility]KA on August 26 with vascular surgery, chronic CHF, presents to the emergency department via EMS from nursing home complaining of injuries from a slip and fall.  Patient states she was rolling in bed to get a phone and slipped, falling directly to the floor and landing on her buttocks.  When she fell she had a small dehiscence of the left BKA wound.  She has a known sacral decubitus ulcer and has pain in that area.  She denies other injuries or complaints.    Fall       Prior to Admission medications   Medication Sig Start Date End Date Taking? Authorizing Provider  acetaminophen  (TYLENOL ) 500 MG tablet Take 500 mg by mouth daily as needed for moderate pain (pain score 4-6) or headache.    [provider]  ALPRAZolam  (XANAX ) 0.25 MG tablet Take 1 tablet (0.25 mg total) by mouth every 8 (eight) hours as needed for anxiety. 07/15/24   Amin, Ankit C, MD  Amino Acids-Protein Hydrolys (FEEDING SUPPLEMENT, PRO-STAT 64,) LIQD Take 30 mLs by mouth 3 (three) times daily with meals.    [provider]  atorvastatin  (LIPITOR) 40 MG tablet Take 40 mg by mouth at bedtime. 12/15/19   [provider]  baclofen  (LIORESAL ) 10 MG tablet Take 1 tablet (10 mg total) by mouth 2 (two) times daily. 10/19/23   Dezii, Alexandra, DO  Cholecalciferol  (VITAMIN D3 SUPER STRENGTH) 50 MCG (2000 UT) CAPS Take 2,000 Units by mouth daily.    [provider]  clopidogrel  (PLAVIX ) 75 MG tablet Take 75 mg by mouth daily.    [provider]  cyanocobalamin  (VITAMIN B12) 1000 MCG tablet Take 1,000 mcg by mouth daily.    [provider]  feeding supplement (BOOST HIGH PROTEIN) LIQD Take 1 Container by mouth 2  (two) times daily between meals.    [provider]  ferrous sulfate  325 (65 FE) MG tablet Take 325 mg by mouth every Monday, Wednesday, and Friday.    [provider]  gabapentin  (NEURONTIN ) 300 MG capsule Take 300-600 mg by mouth See admin instructions. Take 600 mg by mouth in the morning and 300 mg at bedtime    [provider]  levothyroxine  (SYNTHROID ) 150 MCG tablet Take 150 mcg by mouth daily before breakfast.    [provider]  magnesium  oxide (MAG-OX) 400 (240 Mg) MG tablet Take 400 mg by mouth daily.    [provider]  metFORMIN  (GLUCOPHAGE -XR) 750 MG 24 hr tablet Take 750 mg by mouth 2 (two) times daily with a meal.    [provider]  Multiple Vitamin (MULTIVITAMIN) tablet Take 1 tablet by mouth daily with breakfast.    [provider]  oxyCODONE  (OXYCONTIN ) 10 mg 12 hr tablet Take 1 tablet (10 mg total) by mouth every 12 (twelve) hours. 07/15/24   Amin, Ankit C, MD  oxyCODONE  (ROXICODONE ) 5 MG immediate release tablet Take 0.5-1 tablets (2.5-5 mg total) by mouth every 4 (four) hours as needed for severe pain (pain score 7-10). 07/15/24   Amin, Ankit C, MD  pantoprazole  (PROTONIX ) 40 MG tablet Take 1 tablet (40 mg total) by mouth daily. 05/18/24   Cloretta Arley NOVAK, MD  sertraline  (ZOLOFT ) 100 MG tablet Take 150 mg by mouth at bedtime.    [provider]  traMADol  (ULTRAM ) 50 MG tablet Take 1 tablet (50 mg total) by mouth every 8 (eight) hours as needed for moderate pain (pain score 4-6). 07/15/24   Caleen Burgess BROCKS, MD    Allergies: Codeine    Review of Systems  Updated Vital Signs BP (!) 162/68   Pulse 81   Temp 97.9 F (36.6 C) (Oral)   Resp 18   SpO2 100%   Physical Exam Vitals and nursing note reviewed.  HENT:     Head: Normocephalic and atraumatic.  Eyes:     Conjunctiva/sclera: Conjunctivae normal.  Cardiovascular:     Rate and Rhythm: Normal rate and regular rhythm.  Pulmonary:     Effort:  Pulmonary effort is normal.     Breath sounds: Normal breath sounds.  Abdominal:     Palpations: Abdomen is soft.     Tenderness: There is no abdominal tenderness.  Musculoskeletal:        General: Signs of injury present. No swelling, tenderness or deformity. Normal range of motion.     Cervical back: Normal range of motion.     Comments: Left BKA.  Very small area of wound dehiscence to the medial portion of the suture line.  Steri-Strips in place.  No active bleeding. Sacral pressure wound noted.   Skin:    General: Skin is dry.  Neurological:     Mental Status: She is alert.  Psychiatric:        Speech: Speech normal.        Behavior: Behavior normal.     (all labs ordered are listed, but only abnormal results are displayed) Labs Reviewed - No data to display  EKG: None  Radiology: DG Sacrum/Coccyx Result Date: 08/25/2024 EXAM: 3 VIEW(S) XRAY OF THE SACRUM AND COCCYX 08/25/2024 05:23:09 AM COMPARISON: Pelvis radiograph today reported separately. CLINICAL HISTORY: 78 year old female. Sacral pain post fall. FINDINGS: BONES AND JOINTS: Sacral and pelvic osteopenia. Si joints appear stable. Sacrum appears grossly intact. No acute fracture. No focal osseous lesion. Left hip arthroplasty detailed separately today. SOFT TISSUES: Left common iliac artery vascular stent and calcified atherosclerosis. IMPRESSION: 1. Osteopenia.  No acute sacral fracture identified. 2. See also Pelvis radiograph reported separately today. Electronically signed by: Helayne Hurst MD 08/25/2024 06:09 AM EDT RP Workstation: [id number]   DG Pelvis 1-2 Views Result Date: 08/25/2024 EXAM: 1 or 2 VIEW(S) XRAY OF THE PELVIS 08/25/2024 05:23:09 AM COMPARISON: Pelvis radiograph 05/26/2024 and earlier studies. CLINICAL HISTORY: 78 year old female with buttock pain and left BKA pain post fall. FINDINGS: BONES AND JOINTS: Chronic left total hip arthroplasty. Interval superimposed ORIF of the visible left femoral shaft  including new lateral plate and interlocking proximal screw. Chronic bone resorption along the superior acetabular component. This appears radiographically stable. Underlying osteopenia. Pelvis appears to remain intact. L2 appears intact. No acute osseous abnormality identified. No joint dislocation. SOFT TISSUES: Nonobstructed bowel gas pattern. Iliac artery vascular stents and calcified atherosclerosis. IMPRESSION: 1. No acute osseous abnormality identified; 2. Chronic left total hip arthroplasty with Chronic bone resorption along the superior acetabular component, and interval ORIF of the left femoral shaft, partially visible. Electronically signed by: Helayne Hurst MD 08/25/2024 06:06 AM EDT RP Workstation: [id number]   DG Knee 2 Views Left Result Date: 08/25/2024 EXAM: 2 VIEW(S) XRAY OF THE LEFT KNEE 08/25/2024 05:23:09 AM COMPARISON: Left femur series 05/27/2024. CLINICAL HISTORY: 78 year  old female. Buttock pain, left BKA pain post fall. FINDINGS: BONES AND JOINTS: Left lower extremity below the knee amputation is new from the 05/27/2024 comparison. Underlying osteopenia including at the tibia and fibula osteotomies. No obvious osteolysis. Chronic left femur ORIF hardware with lateral plate and multiple distal interlocking screws. The hardware is not completely included. No acute osseous abnormality identified. No joint dislocation. No significant joint effusion. No significant degenerative changes. SOFT TISSUES: Calcified peripheral vascular disease. The soft tissues are unremarkable. IMPRESSION: 1. New left below-knee amputation since 05/27/2024. 2. Osteopenia and partially visible Chronic left femur ORIF. No acute osseous abnormality identified. 3. Calcified peripheral vascular disease. Electronically signed by: Helayne Hurst MD 08/25/2024 06:04 AM EDT RP Workstation: [id number]     Procedures   Medications Ordered in the ED  oxyCODONE -acetaminophen  (PERCOCET/ROXICET) 5-325 MG per tablet 1 tablet (1  tablet Oral Given 08/25/24 0559)                                    Medical Decision Making Amount and/or Complexity of Data Reviewed Radiology: ordered.  Risk Prescription drug management.   This patient presents to the ED for concern of pain post fall, this involves an extensive number of treatment options, and is a complaint that carries with it a high risk of complications and morbidity.  The differential diagnosis includes fracture, dislocation, soft tissue injury, others   Co morbidities / Chronic conditions that complicate the patient evaluation  As in HPI   Additional history obtained:  Additional history obtained from EMR External records from outside source obtained and reviewed including recent discharge summary and progress note from vascular/vein   Imaging Studies ordered:  I ordered imaging studies including plain films of the left knee, the pelvis, and the sacrum/coccyx I independently visualized and interpreted imaging which showed  1. New left below-knee amputation since 05/27/2024.  2. Osteopenia and partially visible Chronic left femur ORIF. No acute osseous  abnormality identified.  3. Calcified peripheral vascular disease.   2. Chronic left total hip arthroplasty with Chronic bone resorption along the  superior acetabular component, and interval ORIF of the left femoral shaft,  partially visible.  No acute sacral fracture identified.  I agree with the radiologist interpretation   Problem List / ED Course / Critical interventions / Medication management   I ordered medication including Percocet Reevaluation of the patient after these medicines showed that the patient improved I have reviewed the patients home medicines and have made adjustments as needed   Social Determinants of Health:  Patient is a former smoker   Test / Admission - Considered:  Patient with no acute fracture or dislocation noted on imaging.  The patient does have a  sacral pressure wound which appears to be causing her pain from the fall.  Patient is aware of the wound.  No drainage from wound at this time.  No indication for further workup of sacral wound at this time. Patient has small wound dehiscence to medial portion of left BKA suture line.  Steri-Strips applied prior to arrival.  No active bleeding at this time.  No indication for repair or further workup. Patient appears stable for return to nursing facility at this time.  Wound care of sacral wound to be continued by nursing facility.  No indication for further emergent workup or admission.  Discharge.      Final diagnoses:  Fall, initial encounter  Pressure  injury of skin of sacral region, unspecified injury stage  Postoperative wound dehiscence, initial encounter    ED Discharge Orders     None          Logan Ubaldo KATHEE DEVONNA 08/25/24 [id number]    Trine Raynell Moder, MD 08/26/24 TRENNA

## 2024-08-25 NOTE — Discharge Instructions (Signed)
 Your x-rays this morning were reassuring.  The Steri-Strips applied prior to arrival stop the small amount of bleeding from your left below-knee amputation incision line.  Please follow-up as needed with vascular surgery for further evaluation of your below-knee amputation.  Please continue to follow with your primary care and nursing home staff for care of your sacral pressure wound.  Return to the emergency department if you develop any life-threatening symptoms.

## 2024-08-25 NOTE — ED Notes (Signed)
 Secretary called Ptar@7 :00am

## 2024-08-25 NOTE — ED Triage Notes (Signed)
 Pt coming from [facility] facility stating she was reaching for her phone out of bed and slipped out of the bed landing on her bottom and tearing her BKA stitches on her L leg. Bleeding controlled, steri strips and bandage applied. Denies hitting head. On Plavix . Pt alert and oriented

## 2024-09-05 ENCOUNTER — Encounter (HOSPITAL_COMMUNITY)

## 2024-09-05 ENCOUNTER — Ambulatory Visit

## 2024-09-07 ENCOUNTER — Ambulatory Visit: Admitting: Podiatry

## 2024-09-17 DEATH — deceased

## 2024-09-20 ENCOUNTER — Other Ambulatory Visit

## 2024-09-20 ENCOUNTER — Ambulatory Visit: Admitting: Oncology
